# Patient Record
Sex: Male | Born: 1965
Health system: Southern US, Community
[De-identification: ages and names within clinical notes are randomized; demographics above are authoritative.]

## PROBLEM LIST (undated history)

## (undated) DIAGNOSIS — M1990 Unspecified osteoarthritis, unspecified site: Secondary | ICD-10-CM

## (undated) DIAGNOSIS — G8929 Other chronic pain: Secondary | ICD-10-CM

## (undated) DIAGNOSIS — N529 Male erectile dysfunction, unspecified: Secondary | ICD-10-CM

## (undated) DIAGNOSIS — M549 Dorsalgia, unspecified: Secondary | ICD-10-CM

## (undated) DIAGNOSIS — I10 Essential (primary) hypertension: Secondary | ICD-10-CM

## (undated) DIAGNOSIS — N400 Enlarged prostate without lower urinary tract symptoms: Secondary | ICD-10-CM

## (undated) DIAGNOSIS — E1140 Type 2 diabetes mellitus with diabetic neuropathy, unspecified: Secondary | ICD-10-CM

## (undated) DIAGNOSIS — D649 Anemia, unspecified: Secondary | ICD-10-CM

## (undated) DIAGNOSIS — N186 End stage renal disease: Secondary | ICD-10-CM

## (undated) DIAGNOSIS — R262 Difficulty in walking, not elsewhere classified: Secondary | ICD-10-CM

## (undated) DIAGNOSIS — I82409 Acute embolism and thrombosis of unspecified deep veins of unspecified lower extremity: Secondary | ICD-10-CM

## (undated) DIAGNOSIS — Z992 Dependence on renal dialysis: Secondary | ICD-10-CM

## (undated) DIAGNOSIS — Z9289 Personal history of other medical treatment: Secondary | ICD-10-CM

## (undated) DIAGNOSIS — I872 Venous insufficiency (chronic) (peripheral): Secondary | ICD-10-CM

## (undated) DIAGNOSIS — E119 Type 2 diabetes mellitus without complications: Secondary | ICD-10-CM

## (undated) HISTORY — DX: Dependence on renal dialysis: N18.6

## (undated) HISTORY — DX: End stage renal disease: Z99.2

## (undated) HISTORY — PX: INSERTION OF DIALYSIS CATHETER: SHX1324

## (undated) HISTORY — DX: Male erectile dysfunction, unspecified: N52.9

## (undated) HISTORY — DX: Essential (primary) hypertension: I10

## (undated) HISTORY — PX: CYST EXCISION: SHX5701

## (undated) HISTORY — DX: Type 2 diabetes mellitus without complications: E11.9

---

## 2000-06-30 ENCOUNTER — Emergency Department (HOSPITAL_COMMUNITY): Admission: EM | Admit: 2000-06-30 | Discharge: 2000-07-01 | Payer: Self-pay | Admitting: *Deleted

## 2000-08-30 ENCOUNTER — Emergency Department (HOSPITAL_COMMUNITY): Admission: EM | Admit: 2000-08-30 | Discharge: 2000-08-30 | Payer: Self-pay | Admitting: Emergency Medicine

## 2000-11-13 ENCOUNTER — Emergency Department (HOSPITAL_COMMUNITY): Admission: EM | Admit: 2000-11-13 | Discharge: 2000-11-14 | Payer: Self-pay | Admitting: Internal Medicine

## 2000-11-15 ENCOUNTER — Emergency Department (HOSPITAL_COMMUNITY): Admission: EM | Admit: 2000-11-15 | Discharge: 2000-11-15 | Payer: Self-pay | Admitting: *Deleted

## 2001-08-01 ENCOUNTER — Emergency Department (HOSPITAL_COMMUNITY): Admission: EM | Admit: 2001-08-01 | Discharge: 2001-08-01 | Payer: Self-pay | Admitting: Emergency Medicine

## 2001-12-11 ENCOUNTER — Emergency Department (HOSPITAL_COMMUNITY): Admission: EM | Admit: 2001-12-11 | Discharge: 2001-12-11 | Payer: Self-pay | Admitting: Internal Medicine

## 2001-12-11 ENCOUNTER — Encounter: Payer: Self-pay | Admitting: Internal Medicine

## 2002-02-16 ENCOUNTER — Emergency Department (HOSPITAL_COMMUNITY): Admission: EM | Admit: 2002-02-16 | Discharge: 2002-02-16 | Payer: Self-pay | Admitting: *Deleted

## 2002-02-16 ENCOUNTER — Encounter: Payer: Self-pay | Admitting: *Deleted

## 2002-05-20 ENCOUNTER — Emergency Department (HOSPITAL_COMMUNITY): Admission: EM | Admit: 2002-05-20 | Discharge: 2002-05-20 | Payer: Self-pay | Admitting: Emergency Medicine

## 2002-05-23 ENCOUNTER — Emergency Department (HOSPITAL_COMMUNITY): Admission: EM | Admit: 2002-05-23 | Discharge: 2002-05-23 | Payer: Self-pay | Admitting: *Deleted

## 2002-05-23 ENCOUNTER — Encounter: Payer: Self-pay | Admitting: *Deleted

## 2002-07-01 ENCOUNTER — Emergency Department (HOSPITAL_COMMUNITY): Admission: EM | Admit: 2002-07-01 | Discharge: 2002-07-01 | Payer: Self-pay | Admitting: Emergency Medicine

## 2002-07-21 ENCOUNTER — Emergency Department (HOSPITAL_COMMUNITY): Admission: EM | Admit: 2002-07-21 | Discharge: 2002-07-21 | Payer: Self-pay | Admitting: Emergency Medicine

## 2002-07-23 ENCOUNTER — Emergency Department (HOSPITAL_COMMUNITY): Admission: EM | Admit: 2002-07-23 | Discharge: 2002-07-23 | Payer: Self-pay | Admitting: *Deleted

## 2002-09-13 ENCOUNTER — Emergency Department (HOSPITAL_COMMUNITY): Admission: EM | Admit: 2002-09-13 | Discharge: 2002-09-13 | Payer: Self-pay | Admitting: Emergency Medicine

## 2002-09-20 ENCOUNTER — Emergency Department (HOSPITAL_COMMUNITY): Admission: EM | Admit: 2002-09-20 | Discharge: 2002-09-20 | Payer: Self-pay | Admitting: Emergency Medicine

## 2002-12-09 ENCOUNTER — Emergency Department (HOSPITAL_COMMUNITY): Admission: EM | Admit: 2002-12-09 | Discharge: 2002-12-09 | Payer: Self-pay | Admitting: *Deleted

## 2003-06-26 ENCOUNTER — Emergency Department (HOSPITAL_COMMUNITY): Admission: EM | Admit: 2003-06-26 | Discharge: 2003-06-26 | Payer: Self-pay | Admitting: Emergency Medicine

## 2003-06-27 ENCOUNTER — Emergency Department (HOSPITAL_COMMUNITY): Admission: EM | Admit: 2003-06-27 | Discharge: 2003-06-27 | Payer: Self-pay | Admitting: Emergency Medicine

## 2003-06-28 ENCOUNTER — Emergency Department (HOSPITAL_COMMUNITY): Admission: EM | Admit: 2003-06-28 | Discharge: 2003-06-28 | Payer: Self-pay | Admitting: Emergency Medicine

## 2003-07-05 ENCOUNTER — Emergency Department (HOSPITAL_COMMUNITY): Admission: EM | Admit: 2003-07-05 | Discharge: 2003-07-05 | Payer: Self-pay | Admitting: Emergency Medicine

## 2003-10-05 ENCOUNTER — Emergency Department (HOSPITAL_COMMUNITY): Admission: EM | Admit: 2003-10-05 | Discharge: 2003-10-06 | Payer: Self-pay | Admitting: *Deleted

## 2003-12-24 ENCOUNTER — Emergency Department (HOSPITAL_COMMUNITY): Admission: EM | Admit: 2003-12-24 | Discharge: 2003-12-24 | Payer: Self-pay | Admitting: Emergency Medicine

## 2004-01-15 ENCOUNTER — Emergency Department (HOSPITAL_COMMUNITY): Admission: EM | Admit: 2004-01-15 | Discharge: 2004-01-15 | Payer: Self-pay | Admitting: Emergency Medicine

## 2004-01-16 ENCOUNTER — Ambulatory Visit (HOSPITAL_COMMUNITY): Admission: RE | Admit: 2004-01-16 | Discharge: 2004-01-16 | Payer: Self-pay | Admitting: Emergency Medicine

## 2004-01-22 ENCOUNTER — Emergency Department (HOSPITAL_COMMUNITY): Admission: EM | Admit: 2004-01-22 | Discharge: 2004-01-22 | Payer: Self-pay | Admitting: Emergency Medicine

## 2004-01-31 ENCOUNTER — Emergency Department (HOSPITAL_COMMUNITY): Admission: EM | Admit: 2004-01-31 | Discharge: 2004-02-01 | Payer: Self-pay | Admitting: Emergency Medicine

## 2004-02-02 ENCOUNTER — Emergency Department (HOSPITAL_COMMUNITY): Admission: EM | Admit: 2004-02-02 | Discharge: 2004-02-02 | Payer: Self-pay | Admitting: Emergency Medicine

## 2004-03-05 ENCOUNTER — Emergency Department (HOSPITAL_COMMUNITY): Admission: EM | Admit: 2004-03-05 | Discharge: 2004-03-05 | Payer: Self-pay | Admitting: Emergency Medicine

## 2004-04-17 ENCOUNTER — Emergency Department (HOSPITAL_COMMUNITY): Admission: EM | Admit: 2004-04-17 | Discharge: 2004-04-17 | Payer: Self-pay | Admitting: Emergency Medicine

## 2004-06-04 ENCOUNTER — Emergency Department (HOSPITAL_COMMUNITY): Admission: EM | Admit: 2004-06-04 | Discharge: 2004-06-04 | Payer: Self-pay | Admitting: Emergency Medicine

## 2004-06-11 ENCOUNTER — Emergency Department (HOSPITAL_COMMUNITY): Admission: EM | Admit: 2004-06-11 | Discharge: 2004-06-11 | Payer: Self-pay | Admitting: Emergency Medicine

## 2004-07-11 ENCOUNTER — Emergency Department (HOSPITAL_COMMUNITY): Admission: EM | Admit: 2004-07-11 | Discharge: 2004-07-11 | Payer: Self-pay | Admitting: Emergency Medicine

## 2004-07-13 ENCOUNTER — Emergency Department (HOSPITAL_COMMUNITY): Admission: EM | Admit: 2004-07-13 | Discharge: 2004-07-13 | Payer: Self-pay | Admitting: *Deleted

## 2004-07-15 ENCOUNTER — Emergency Department (HOSPITAL_COMMUNITY): Admission: EM | Admit: 2004-07-15 | Discharge: 2004-07-15 | Payer: Self-pay | Admitting: Emergency Medicine

## 2004-08-12 ENCOUNTER — Emergency Department (HOSPITAL_COMMUNITY): Admission: EM | Admit: 2004-08-12 | Discharge: 2004-08-12 | Payer: Self-pay | Admitting: Emergency Medicine

## 2004-09-23 ENCOUNTER — Emergency Department (HOSPITAL_COMMUNITY): Admission: EM | Admit: 2004-09-23 | Discharge: 2004-09-23 | Payer: Self-pay | Admitting: Emergency Medicine

## 2004-11-29 ENCOUNTER — Emergency Department (HOSPITAL_COMMUNITY): Admission: EM | Admit: 2004-11-29 | Discharge: 2004-11-29 | Payer: Self-pay | Admitting: Emergency Medicine

## 2005-01-14 ENCOUNTER — Observation Stay (HOSPITAL_COMMUNITY): Admission: EM | Admit: 2005-01-14 | Discharge: 2005-01-15 | Payer: Self-pay | Admitting: *Deleted

## 2005-01-14 ENCOUNTER — Ambulatory Visit: Payer: Self-pay | Admitting: Cardiology

## 2005-02-17 ENCOUNTER — Emergency Department (HOSPITAL_COMMUNITY): Admission: EM | Admit: 2005-02-17 | Discharge: 2005-02-17 | Payer: Self-pay | Admitting: Emergency Medicine

## 2005-02-19 ENCOUNTER — Emergency Department (HOSPITAL_COMMUNITY): Admission: EM | Admit: 2005-02-19 | Discharge: 2005-02-19 | Payer: Self-pay | Admitting: Emergency Medicine

## 2005-03-05 ENCOUNTER — Emergency Department (HOSPITAL_COMMUNITY): Admission: EM | Admit: 2005-03-05 | Discharge: 2005-03-05 | Payer: Self-pay | Admitting: Emergency Medicine

## 2005-03-17 ENCOUNTER — Emergency Department (HOSPITAL_COMMUNITY): Admission: EM | Admit: 2005-03-17 | Discharge: 2005-03-17 | Payer: Self-pay | Admitting: Emergency Medicine

## 2005-04-02 ENCOUNTER — Emergency Department (HOSPITAL_COMMUNITY): Admission: EM | Admit: 2005-04-02 | Discharge: 2005-04-02 | Payer: Self-pay | Admitting: Emergency Medicine

## 2005-06-26 ENCOUNTER — Emergency Department (HOSPITAL_COMMUNITY): Admission: EM | Admit: 2005-06-26 | Discharge: 2005-06-26 | Payer: Self-pay | Admitting: Emergency Medicine

## 2005-07-19 IMAGING — US US EXTREM LOW VENOUS*L*
1 series · 14 of 24 positions shown · non-contrast
Comparison: none

CLINICAL DATA: Pain lt leg.
 ULTRASOUND VENOUS IMAGING UNILATERAL LEFT LOWER EXTREMITY:
 Multiple scans of the left leg show no evidence of deep venous thrombi.  The deep veins show normal compressibility and waveforms.  The saphenous system also appears normal.

[Series 1: unknown · 14 of 29 slices shown]
[im 1/29]
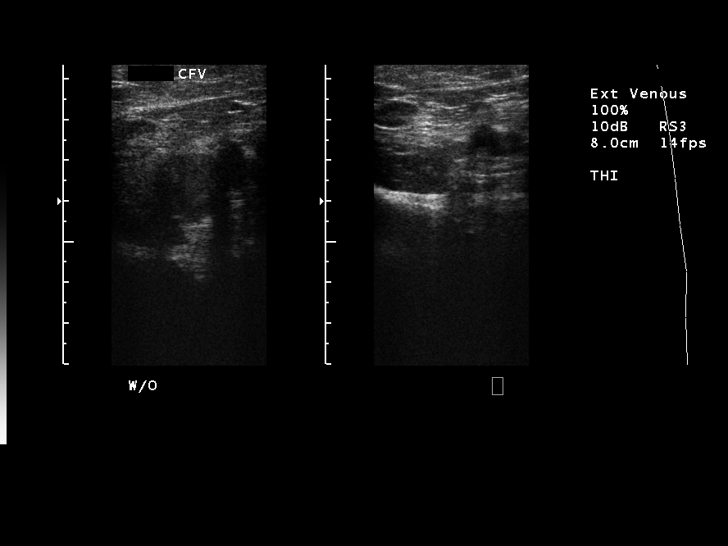
[im 3/29]
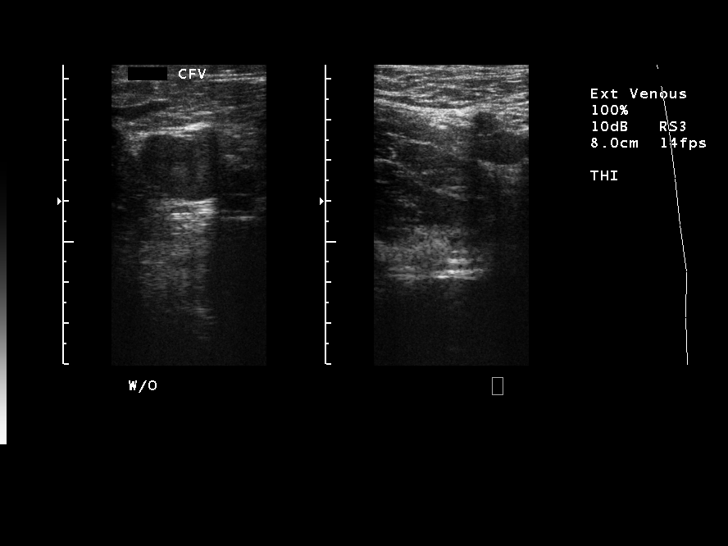
[im 5/29]
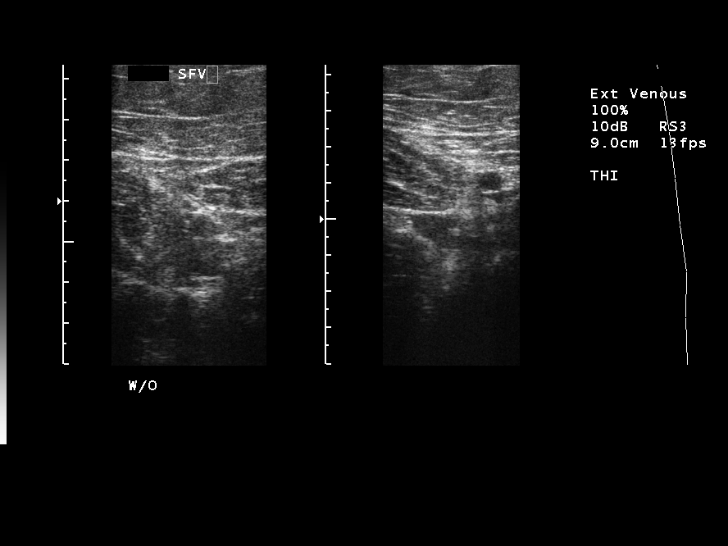
[im 8/29]
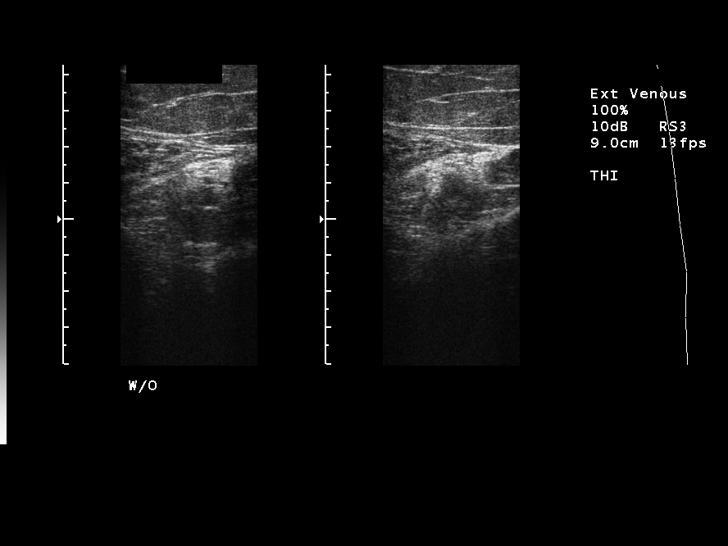
[im 9/29]
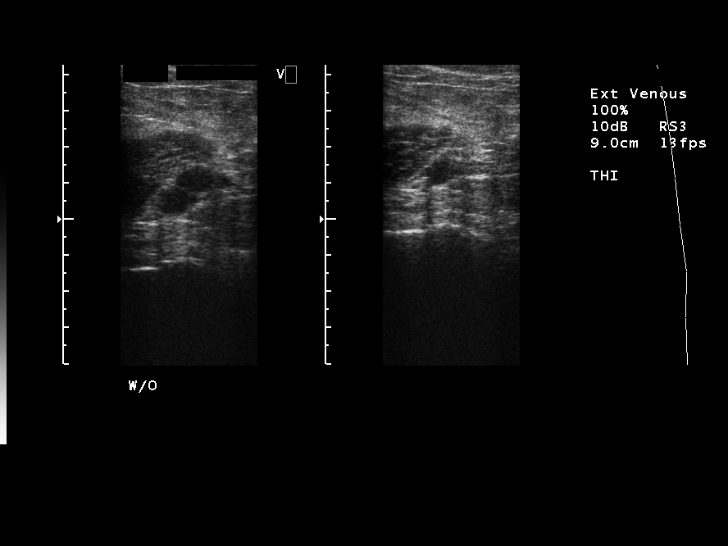
[im 11/29]
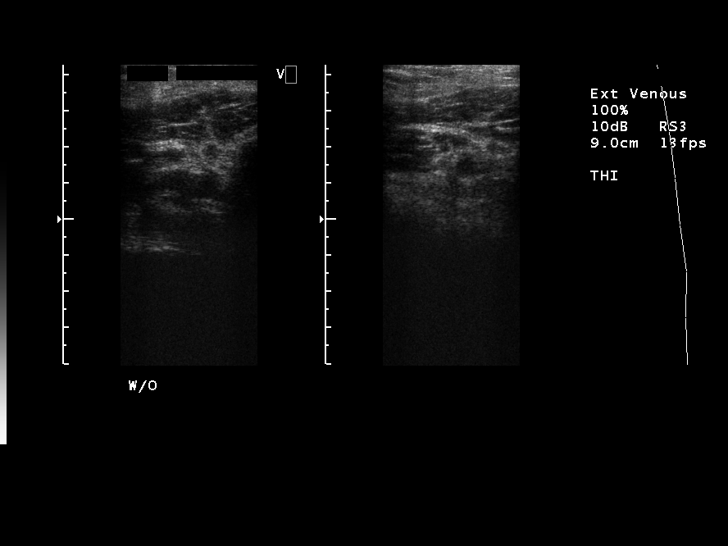
[im 14/29]
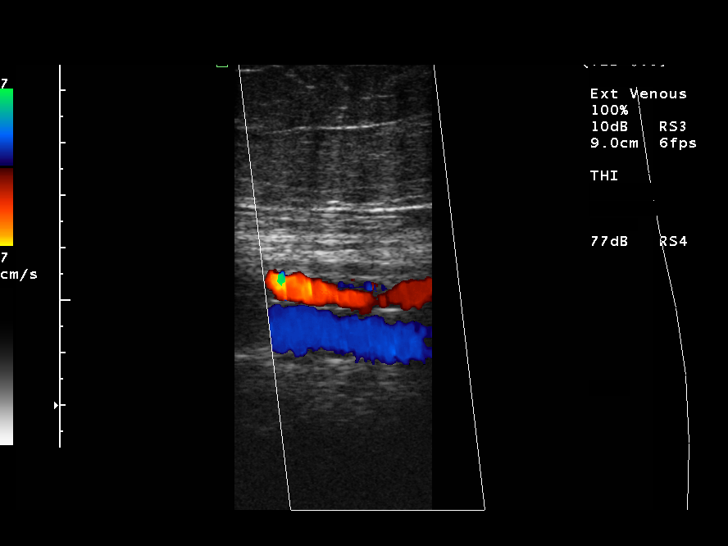
[im 15/29]
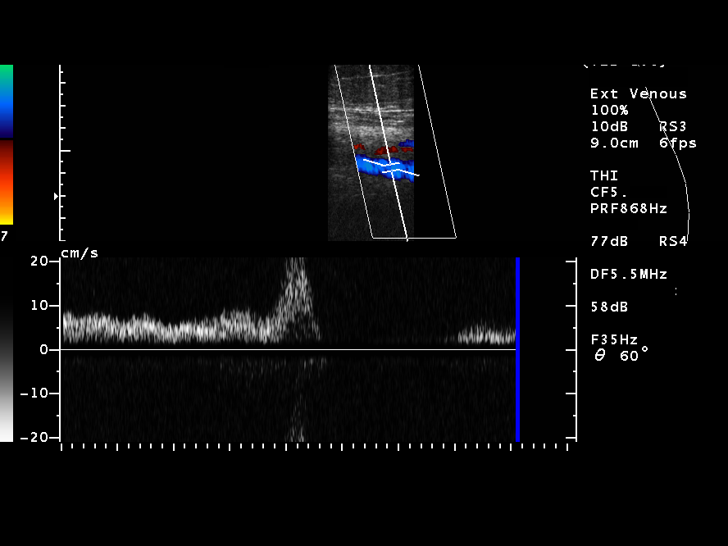
[im 18/29]
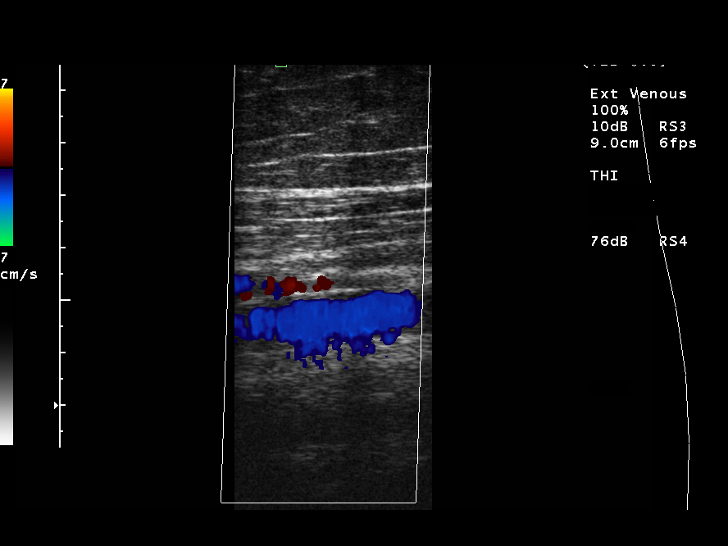
[im 20/29]
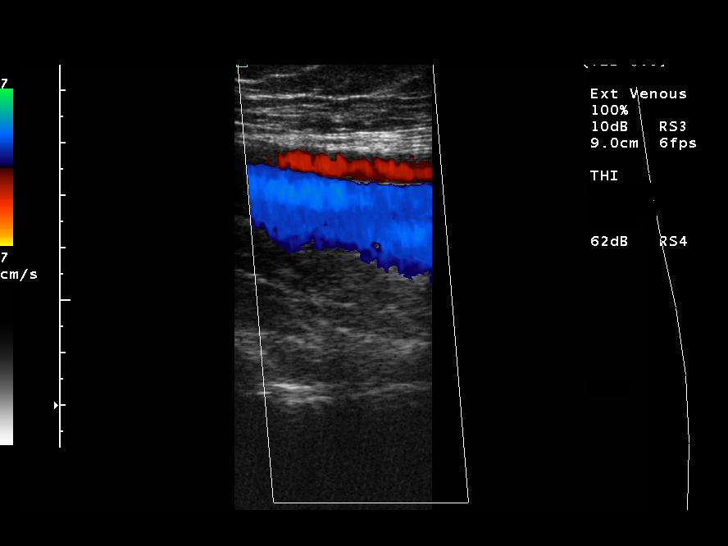
[im 22/29]
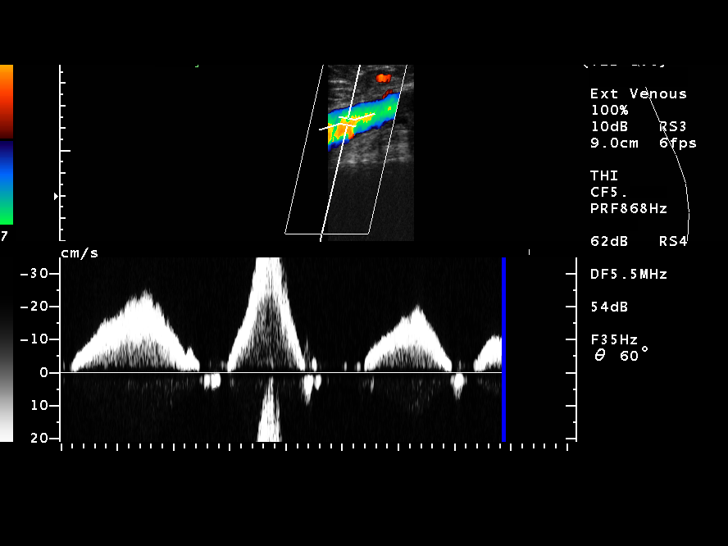
[im 24/29]
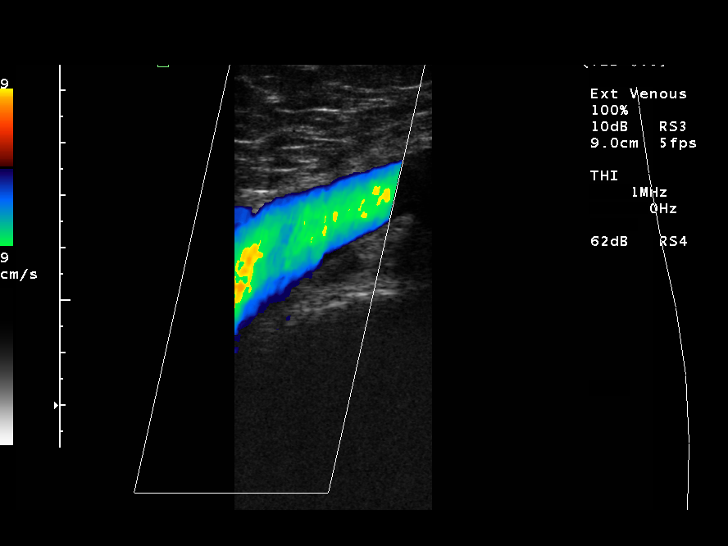
[im 26/29]
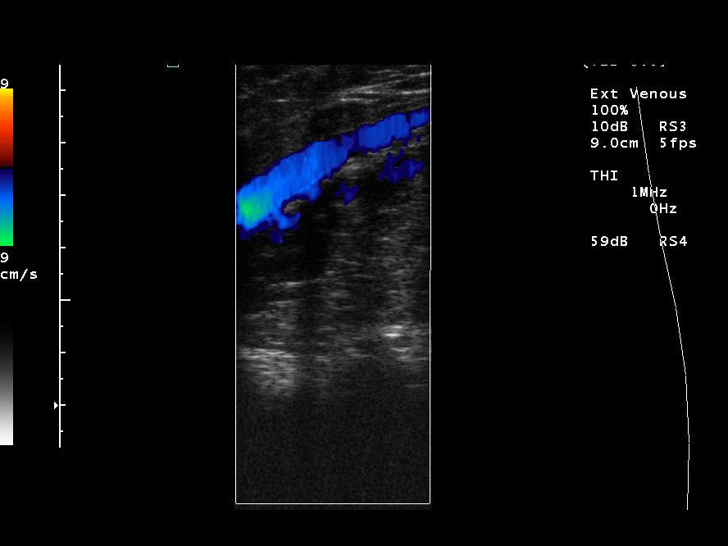
[im 29/29]
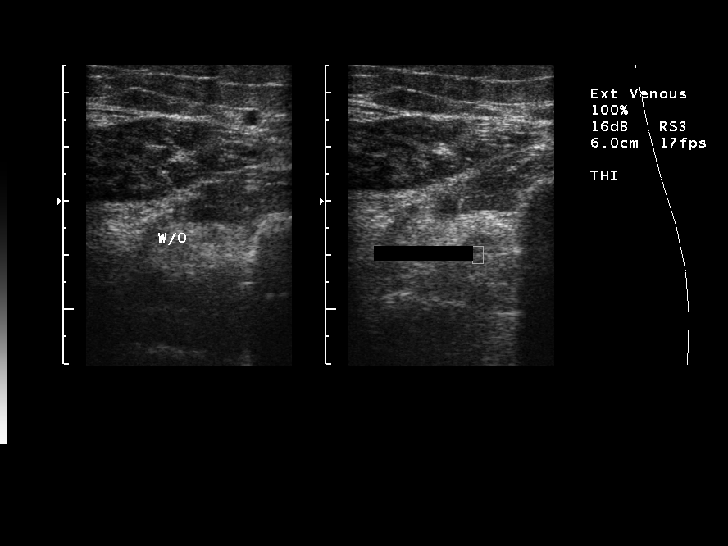

[14 of 24 positions shown; findings below may reference images not displayed]

IMPRESSION: No evidence of deep venous thrombi or other abnormality venous structures of the left leg.

## 2005-07-25 IMAGING — CR DG FOOT COMPLETE 3+V*R*
3 series · 3 of 3 positions shown · non-contrast
Comparison: none

CLINICAL DATA: Foot pain and swelling for 3 days.  No injury. 
 RIGHT FOOT 3 VIEWS, 01/22/04:
 Soft tissue swelling is present on the dorsal aspect of the foot.  No fracture or dislocation is seen.  There are advanced degenerative changes of the 1st metatarsophalangeal joint.  There is spur formation on the dorsal aspect of the mid foot that could be from old trauma.  A plantar heel spur is present.

[view not recorded (1 of 3)]
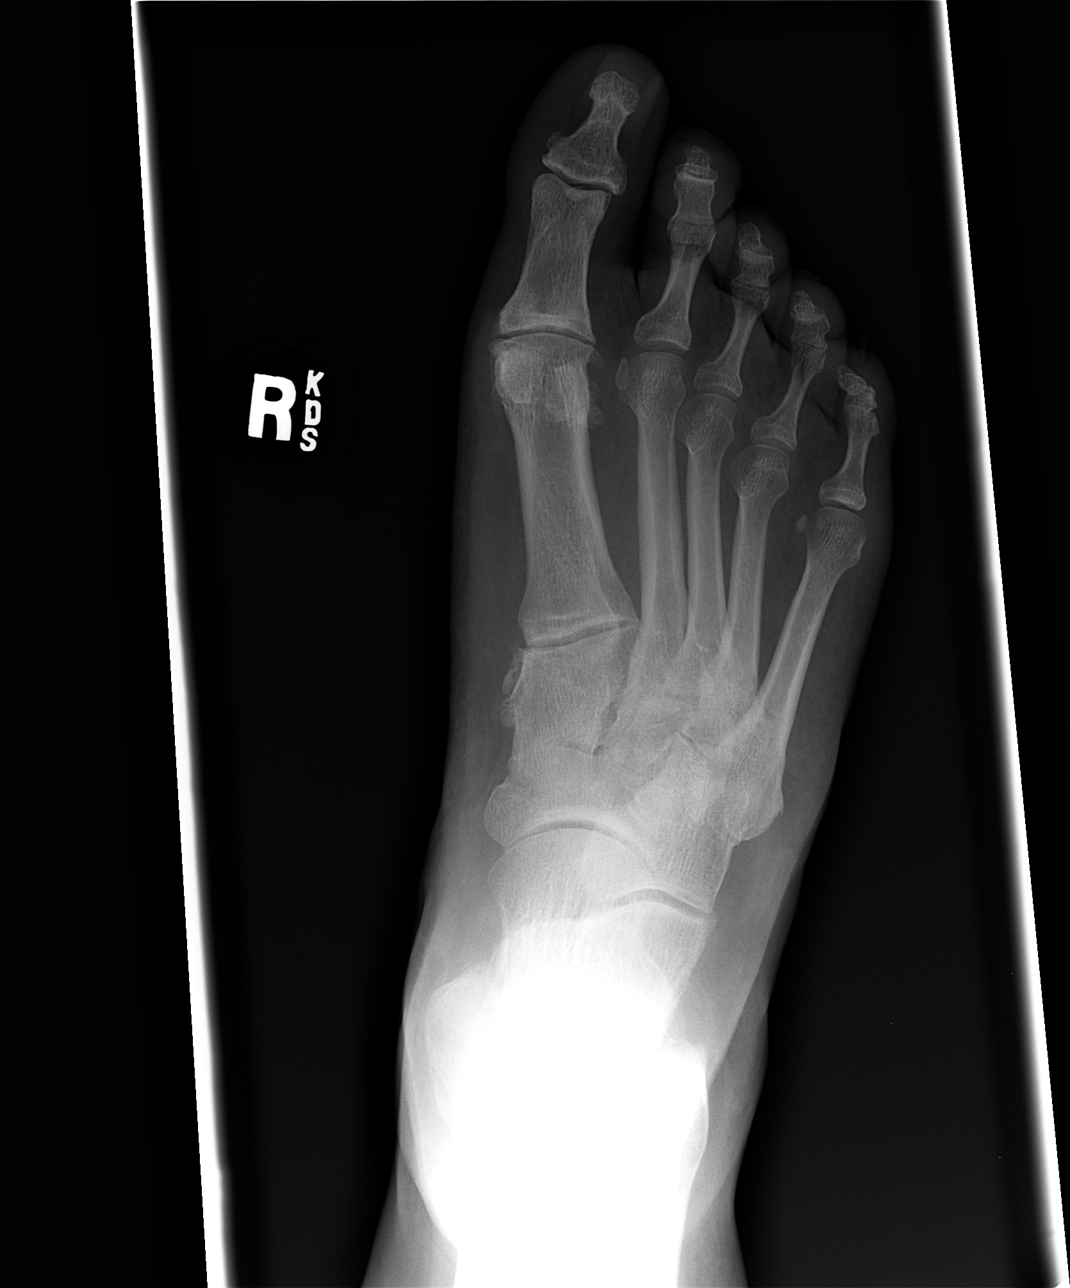

[view not recorded (2 of 3)]
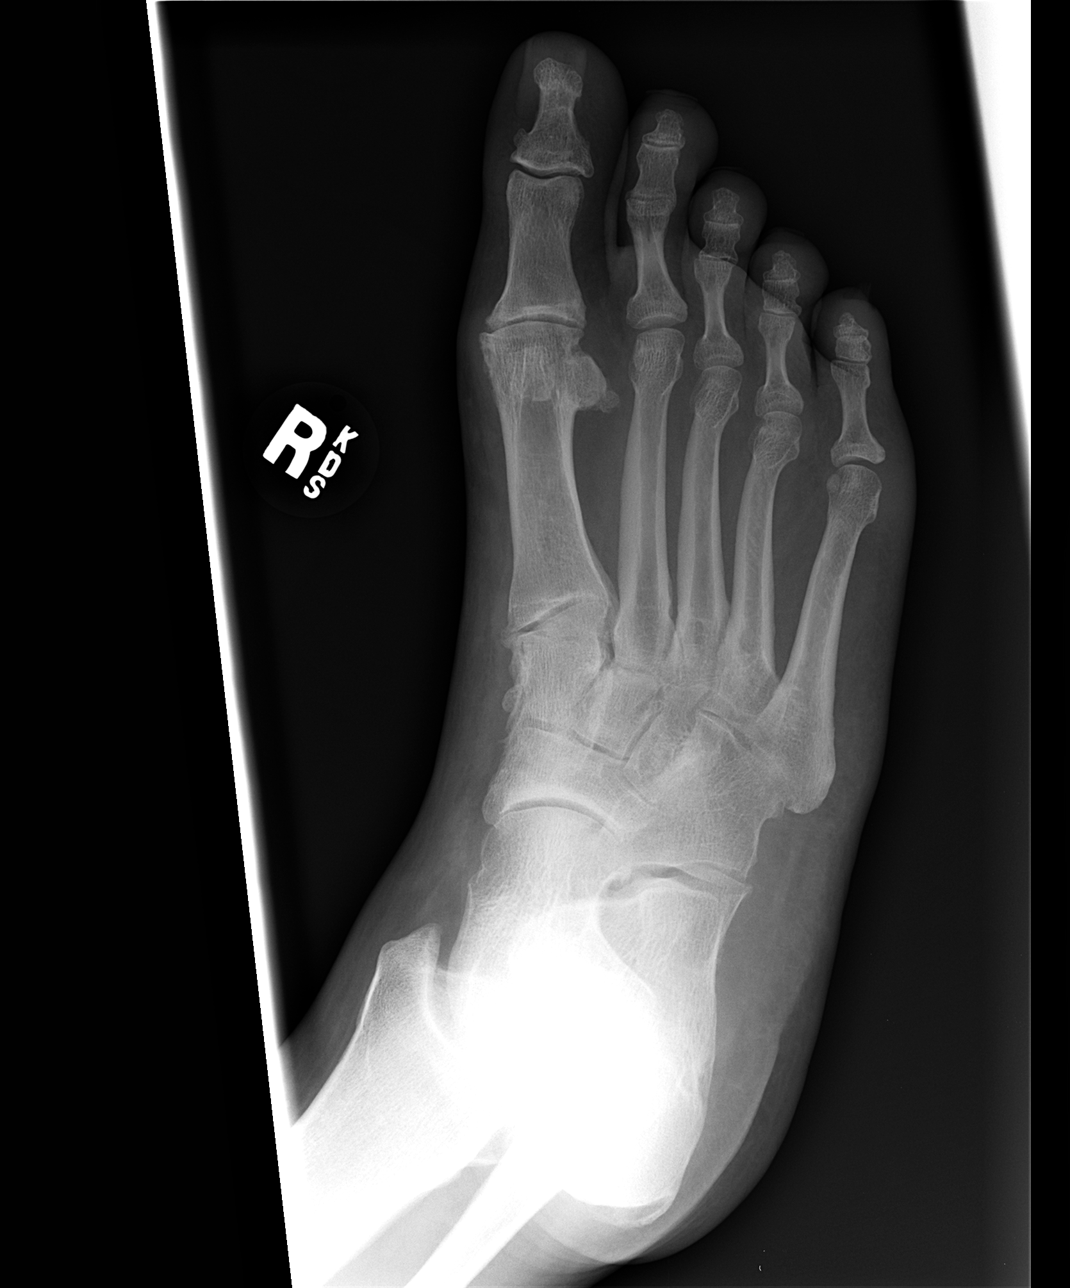

[view not recorded (3 of 3)]
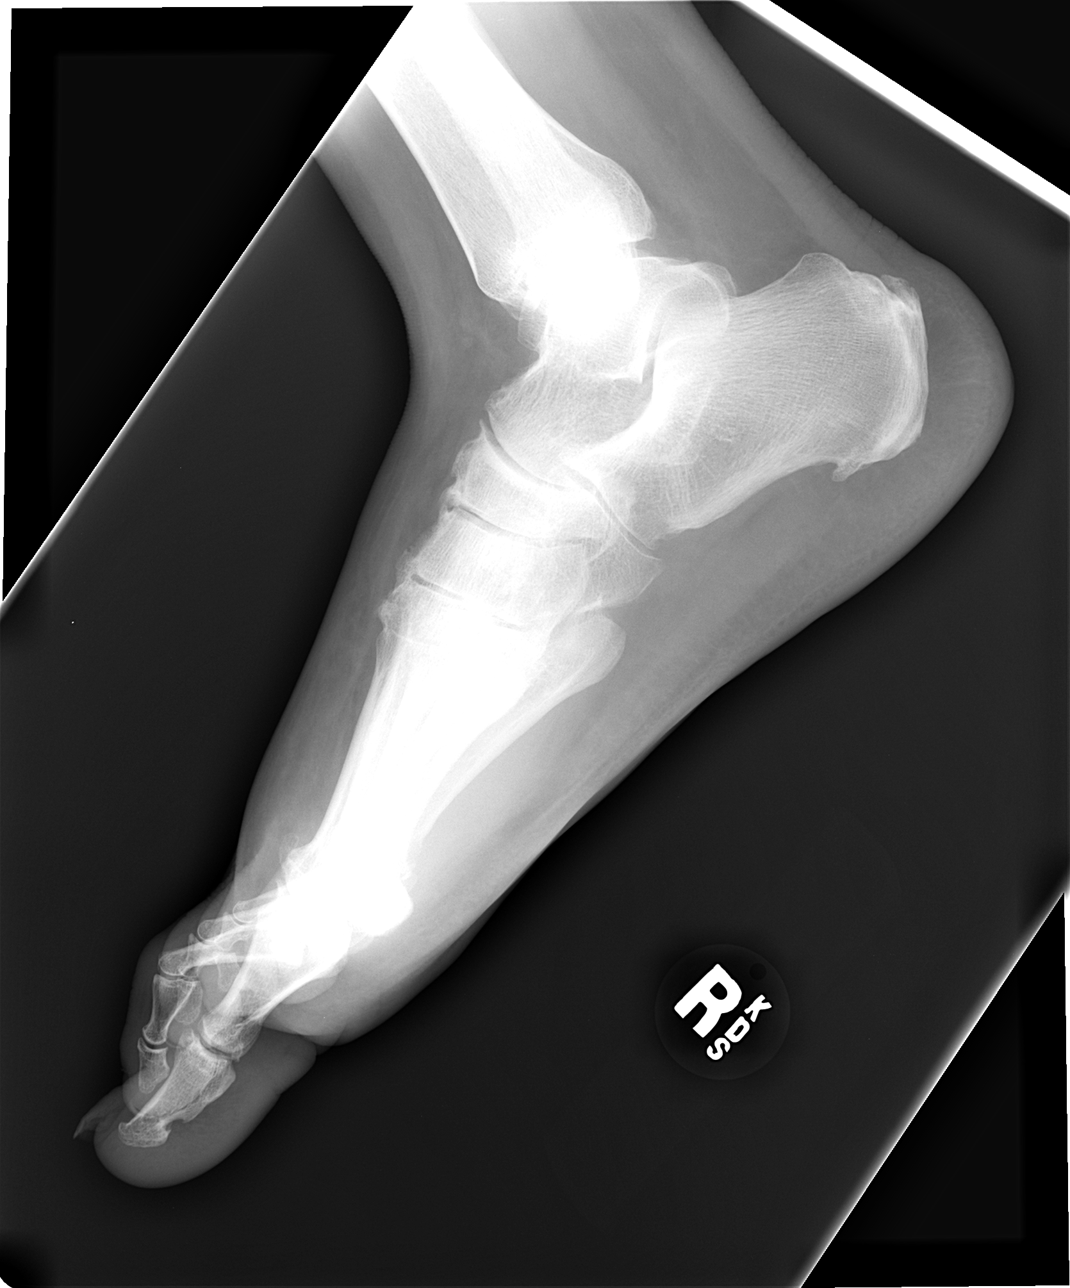

[3 of 3 positions shown; findings below may reference images not displayed]

IMPRESSION: 1. Dorsal soft tissue swelling. 
 2. Degenerative change of the 1st metatarsophalangeal joint.  
 3. Degenerative changes on the dorsal aspect of the mid foot that could be from old trauma. 
 4. Plantar heel spur.

## 2005-07-25 IMAGING — CR DG HIP COMPLETE 2+V*R*
3 series · 3 of 3 positions shown · non-contrast
Comparison: none

CLINICAL DATA: Pain and swelling for three days; no injury
 COMPLETE RIGHT HIP:
 There are moderate degenerative changes of both hips.  No fracture or dislocation is seen.  Sacroiliac joints within normal limits.

[view not recorded (1 of 3)]
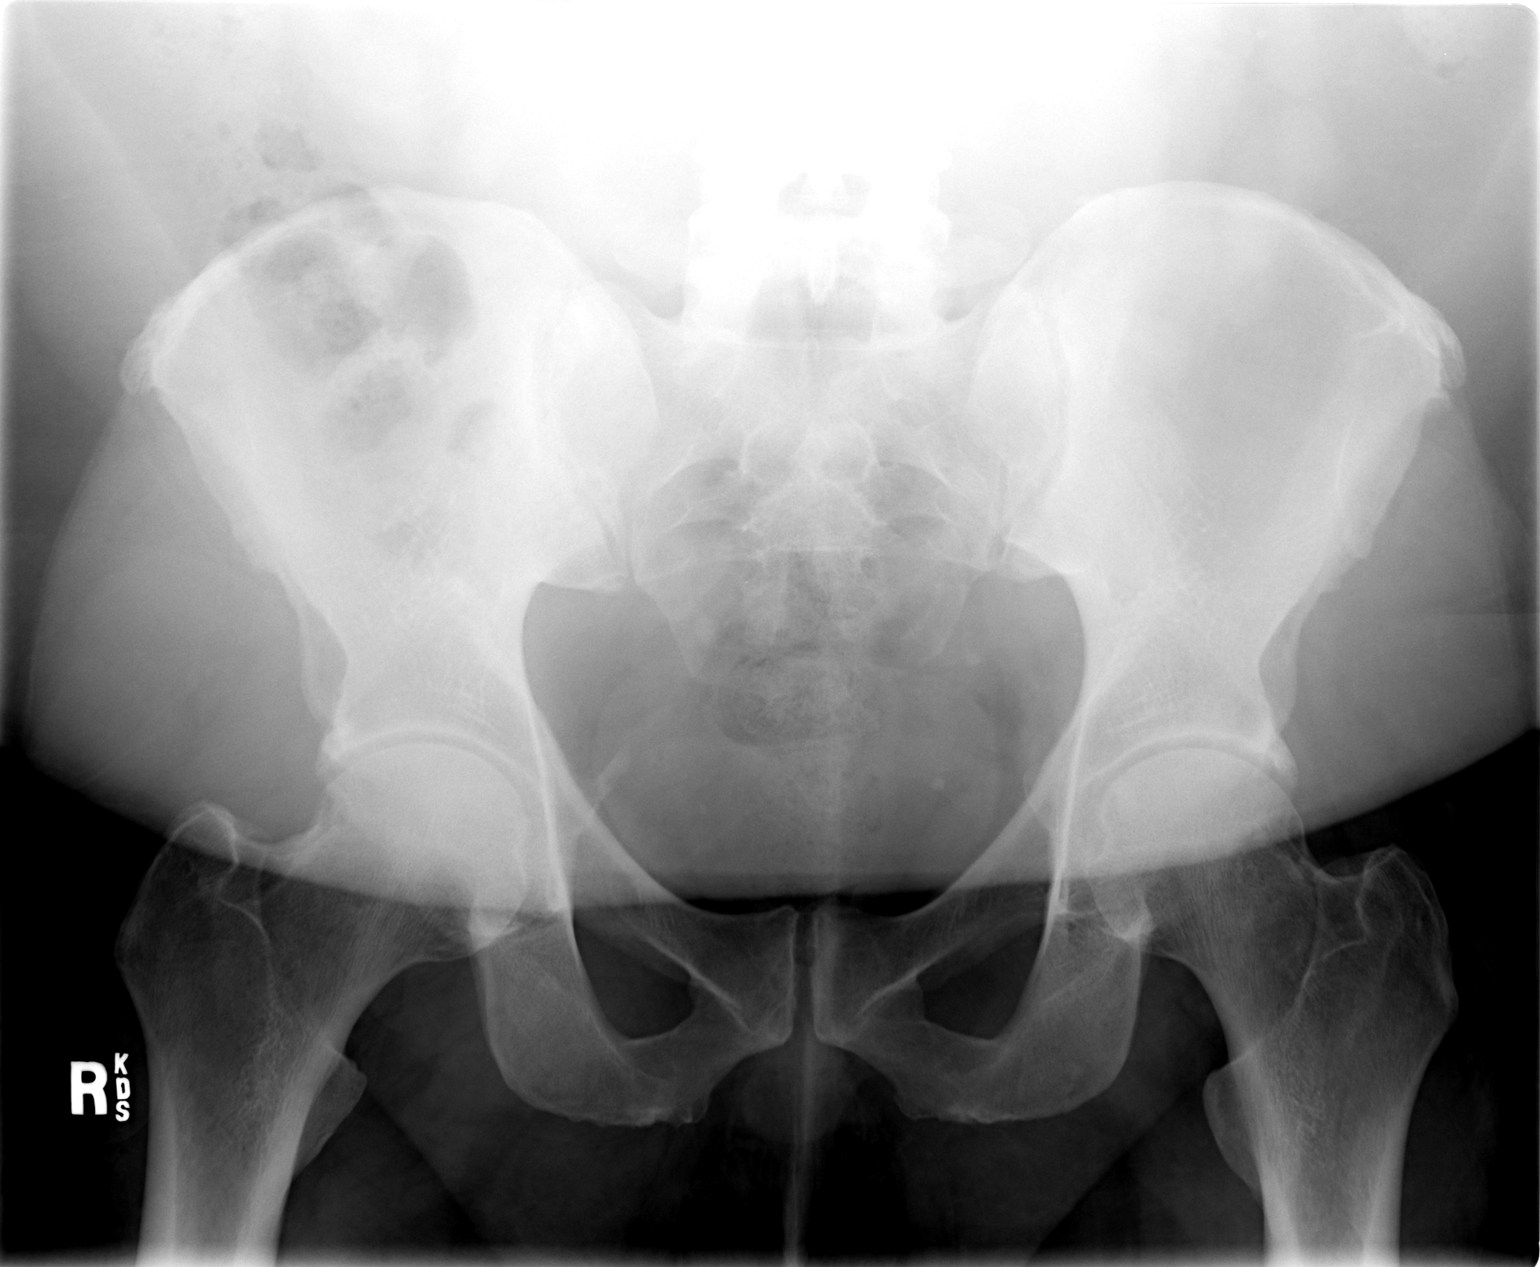

[view not recorded (2 of 3)]
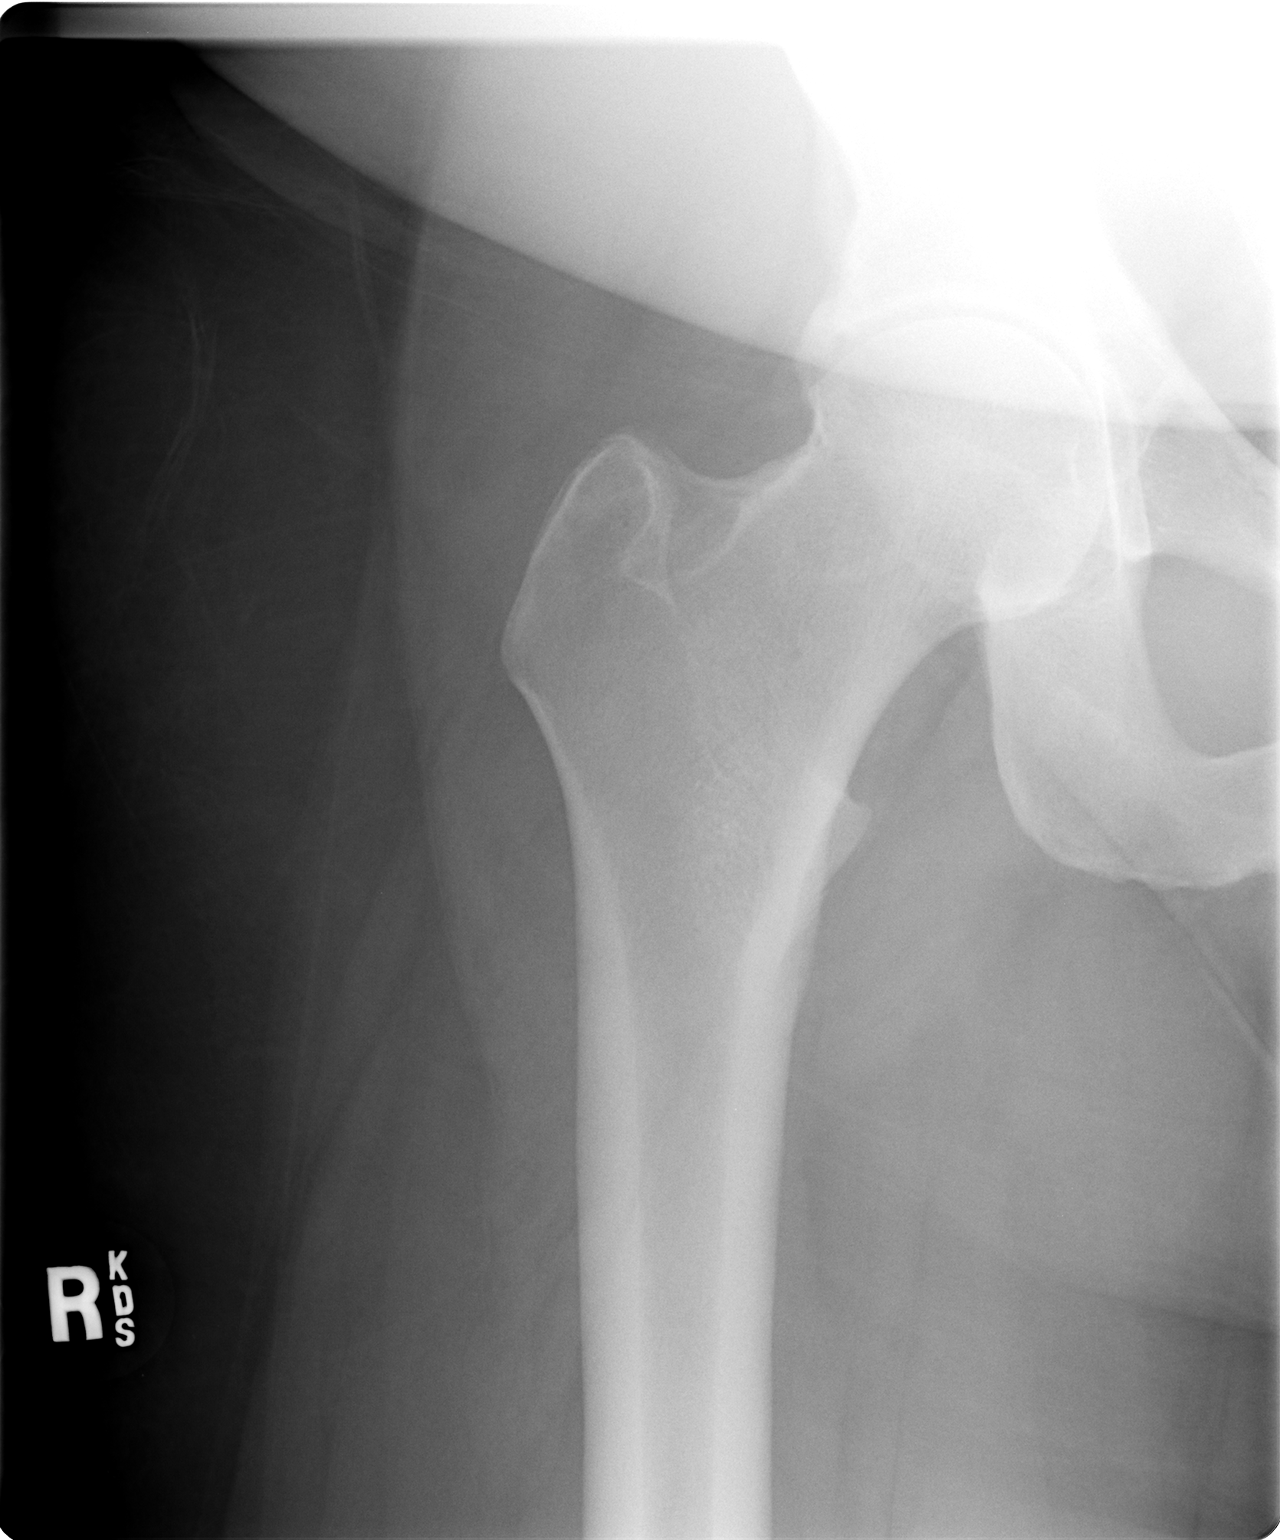

[view not recorded (3 of 3)]
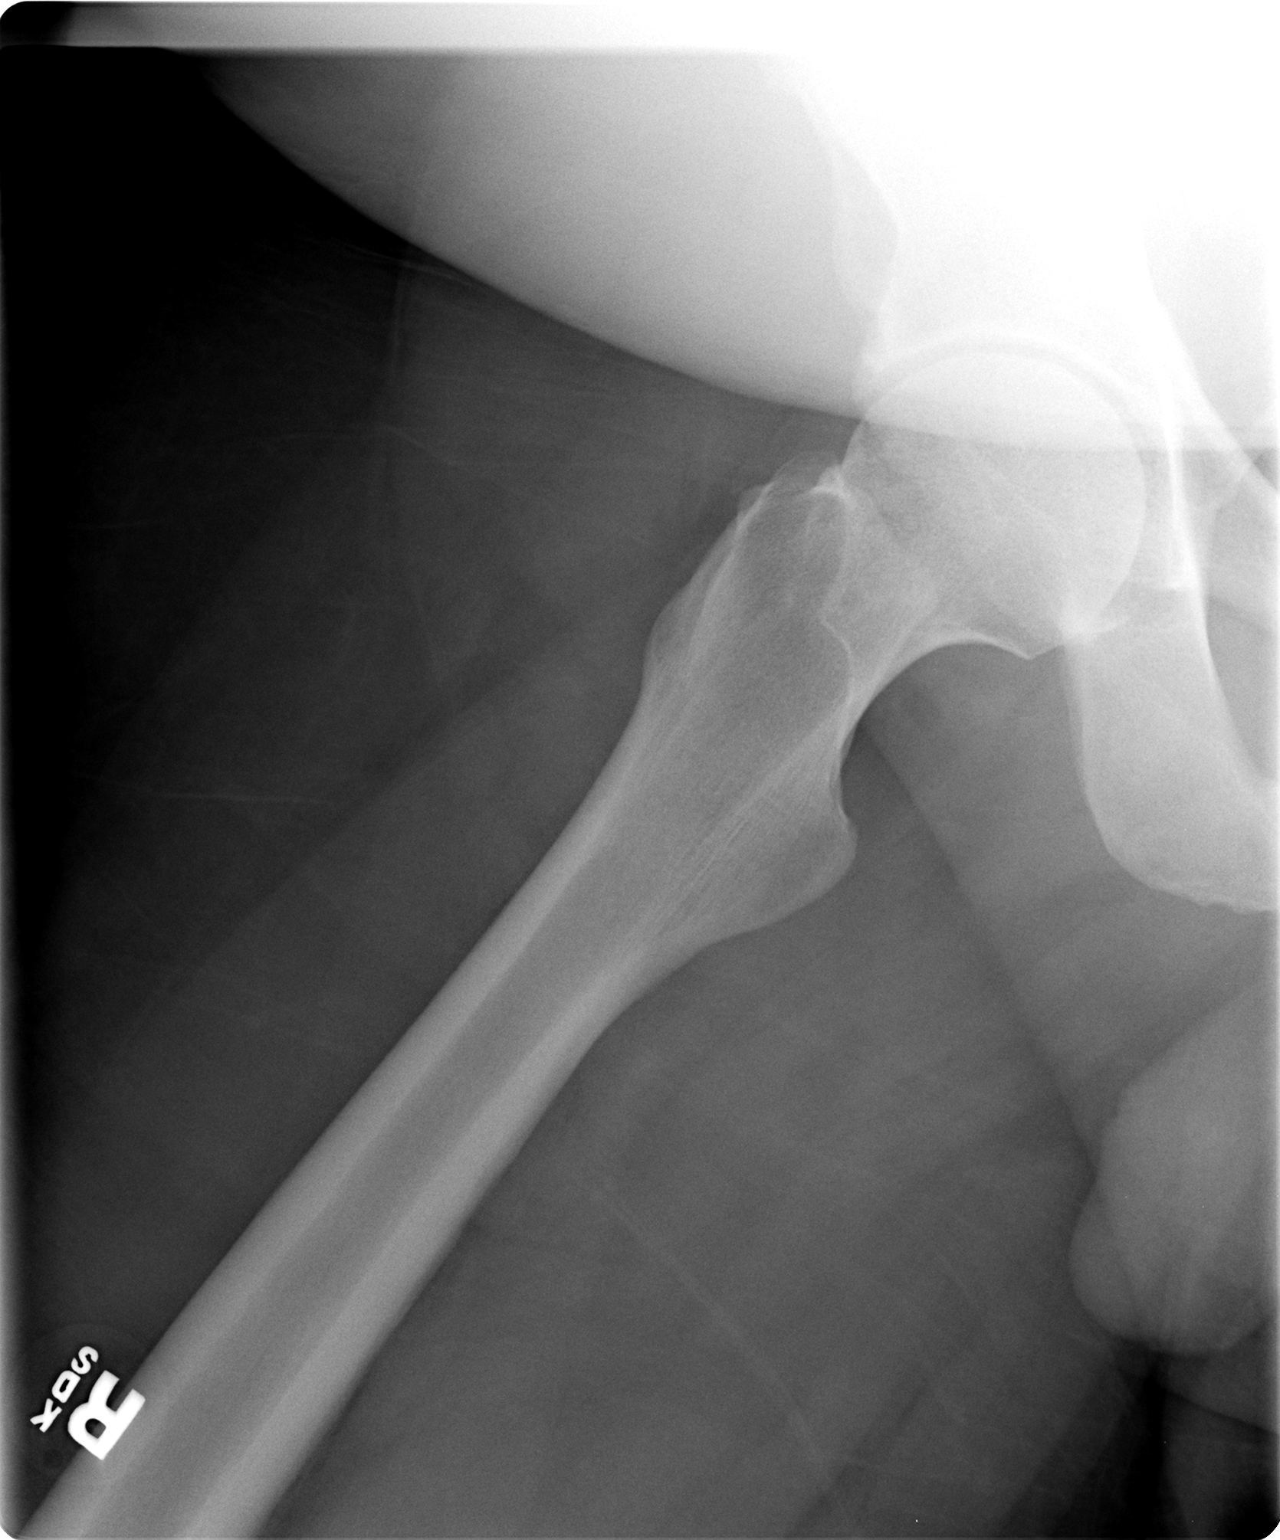

[3 of 3 positions shown; findings below may reference images not displayed]

IMPRESSION: Moderate degenerative changes of both hips.

## 2005-08-04 ENCOUNTER — Emergency Department (HOSPITAL_COMMUNITY): Admission: EM | Admit: 2005-08-04 | Discharge: 2005-08-04 | Payer: Self-pay | Admitting: Emergency Medicine

## 2005-08-04 IMAGING — CR DG WRIST COMPLETE 3+V*R*
4 series · 4 of 4 positions shown · non-contrast
Comparison: none

CLINICAL DATA: Right wrist pain and swelling.  History of   gout.  No known trauma.
 RIGHT WRIST FOUR VIEWS:
 There is no bony abnormality of the wrist.  There is a slight deformity of the head of the second metacarpal, which may be due to remote trauma.

[view not recorded (1 of 4)]
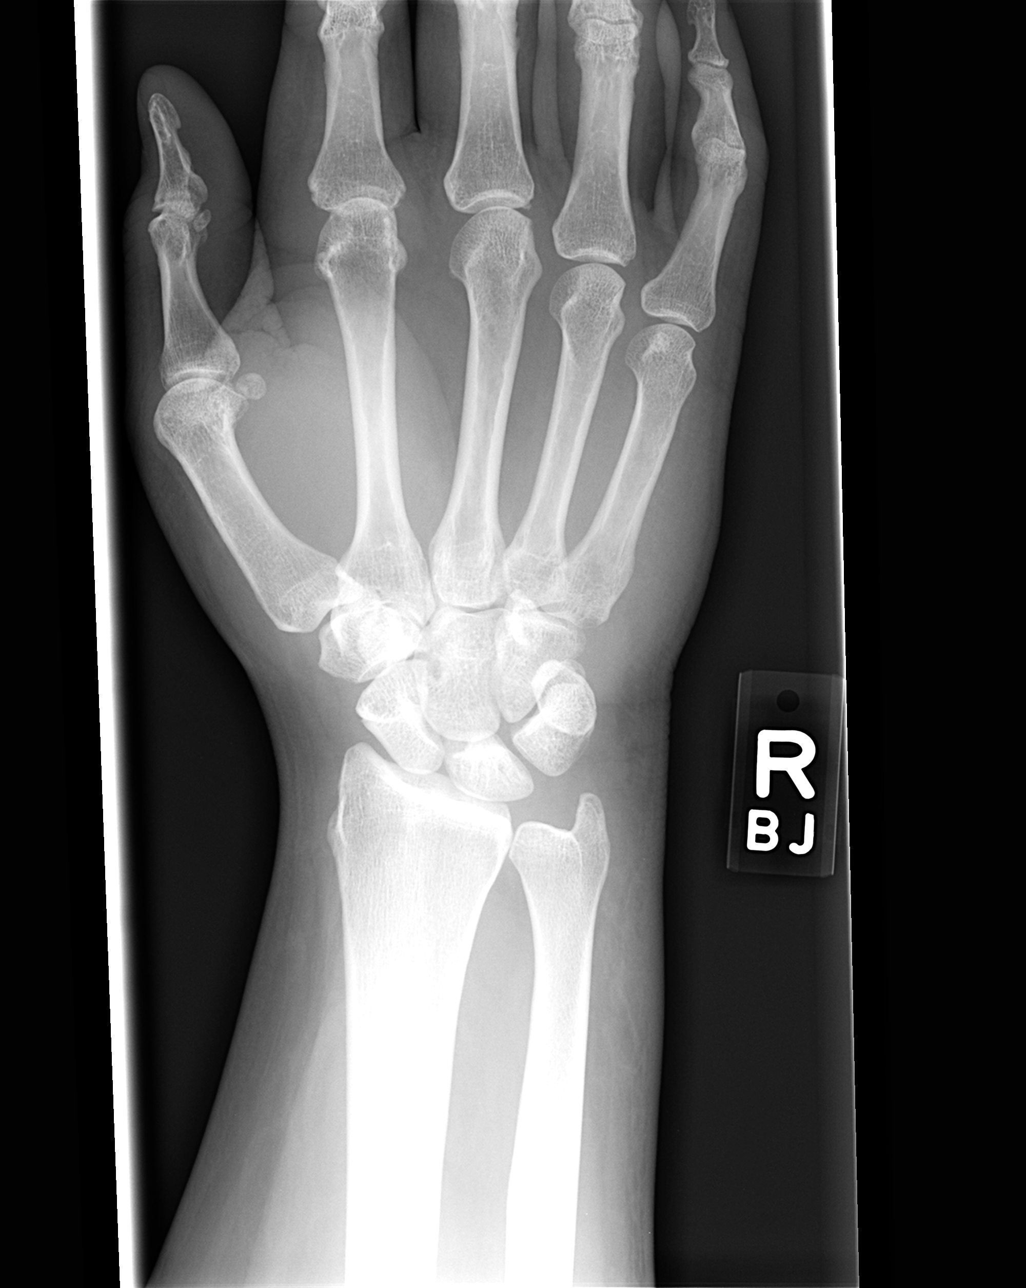

[view not recorded (2 of 4)]
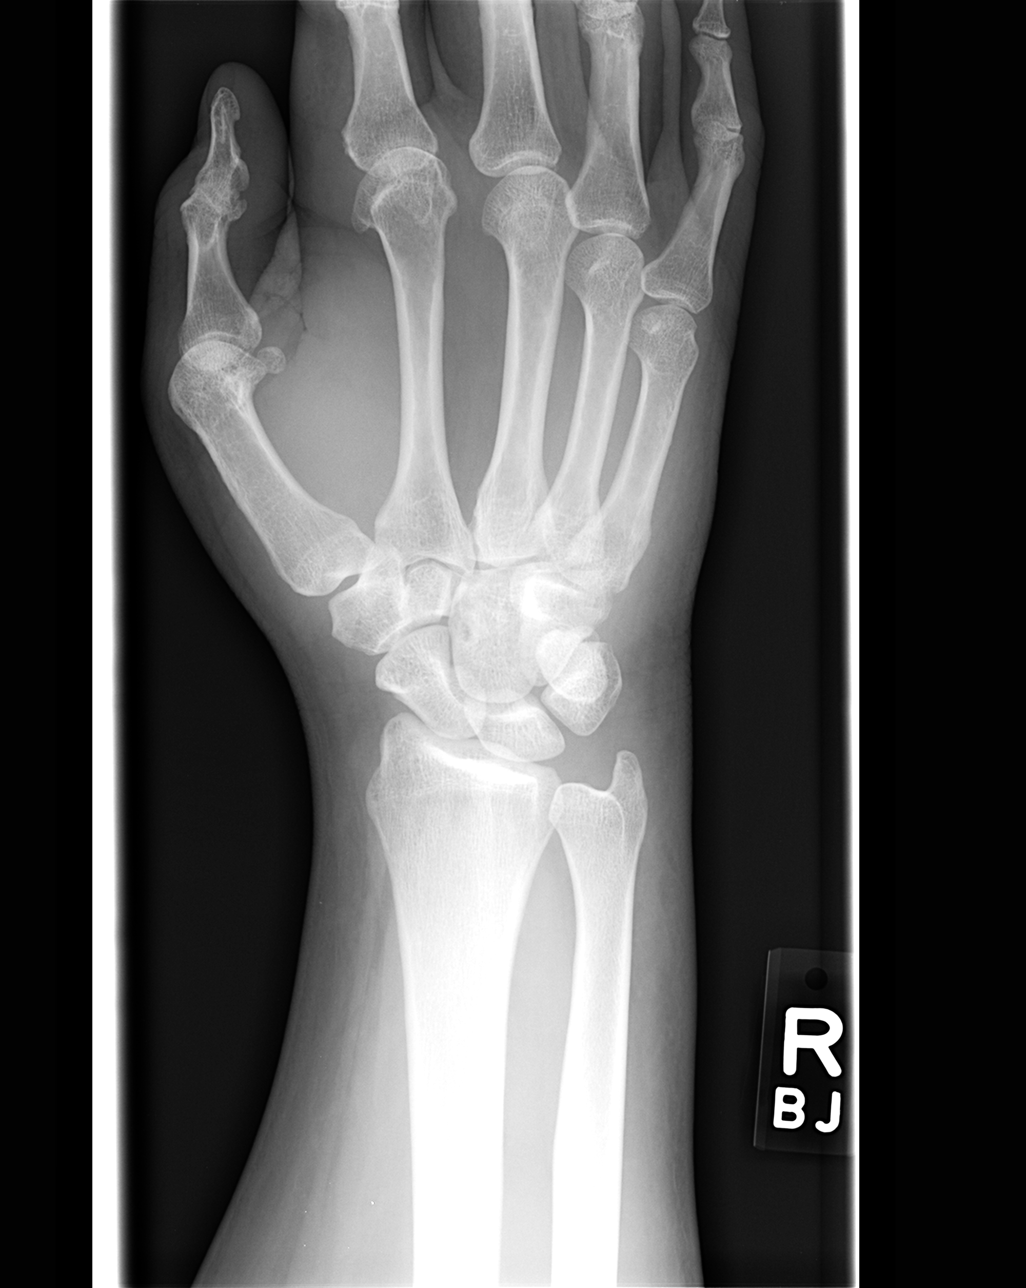

[view not recorded (3 of 4)]
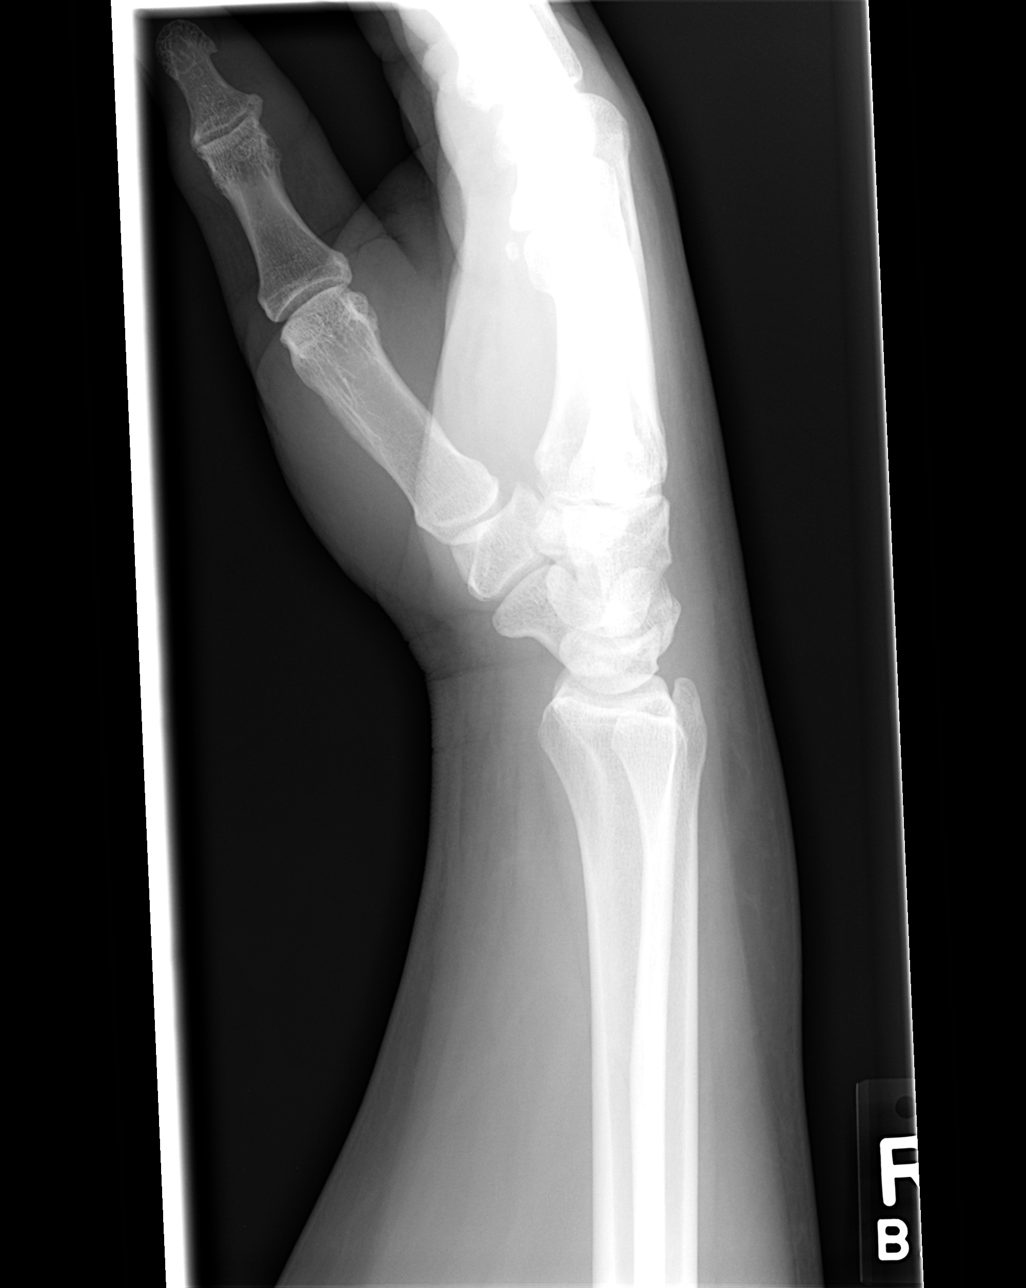

[view not recorded (4 of 4)]
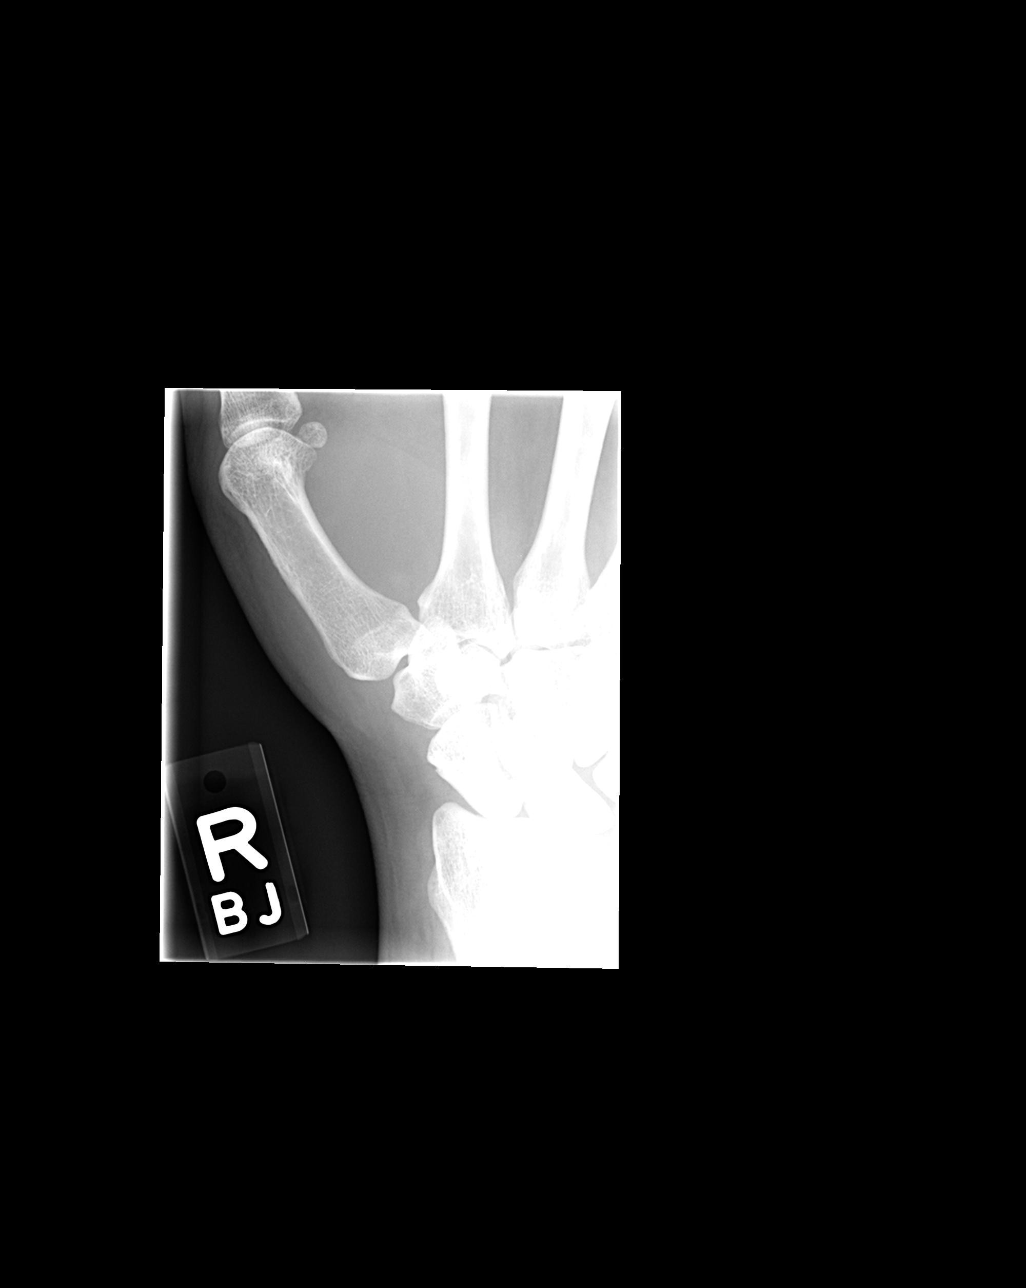

[4 of 4 positions shown; findings below may reference images not displayed]

IMPRESSION: No significant abnormality.

## 2005-08-25 ENCOUNTER — Emergency Department (HOSPITAL_COMMUNITY): Admission: EM | Admit: 2005-08-25 | Discharge: 2005-08-25 | Payer: Self-pay | Admitting: Emergency Medicine

## 2005-08-28 ENCOUNTER — Emergency Department (HOSPITAL_COMMUNITY): Admission: EM | Admit: 2005-08-28 | Discharge: 2005-08-28 | Payer: Self-pay | Admitting: Emergency Medicine

## 2005-09-07 ENCOUNTER — Emergency Department (HOSPITAL_COMMUNITY): Admission: EM | Admit: 2005-09-07 | Discharge: 2005-09-07 | Payer: Self-pay | Admitting: Emergency Medicine

## 2005-10-04 ENCOUNTER — Emergency Department (HOSPITAL_COMMUNITY): Admission: EM | Admit: 2005-10-04 | Discharge: 2005-10-04 | Payer: Self-pay | Admitting: Emergency Medicine

## 2005-10-19 IMAGING — CR DG TOE GREAT 2+V*R*
2 series · 2 of 2 positions shown · non-contrast
Comparison: none

CLINICAL DATA: Injured 1 day ago, pain. 
 RIGHT GREAT TOE:
 There are no fractures or dislocations.  There are degenerative arthritic changes seen associated with the 1st metatarsophalangeal joint.

[view not recorded (1 of 2)]
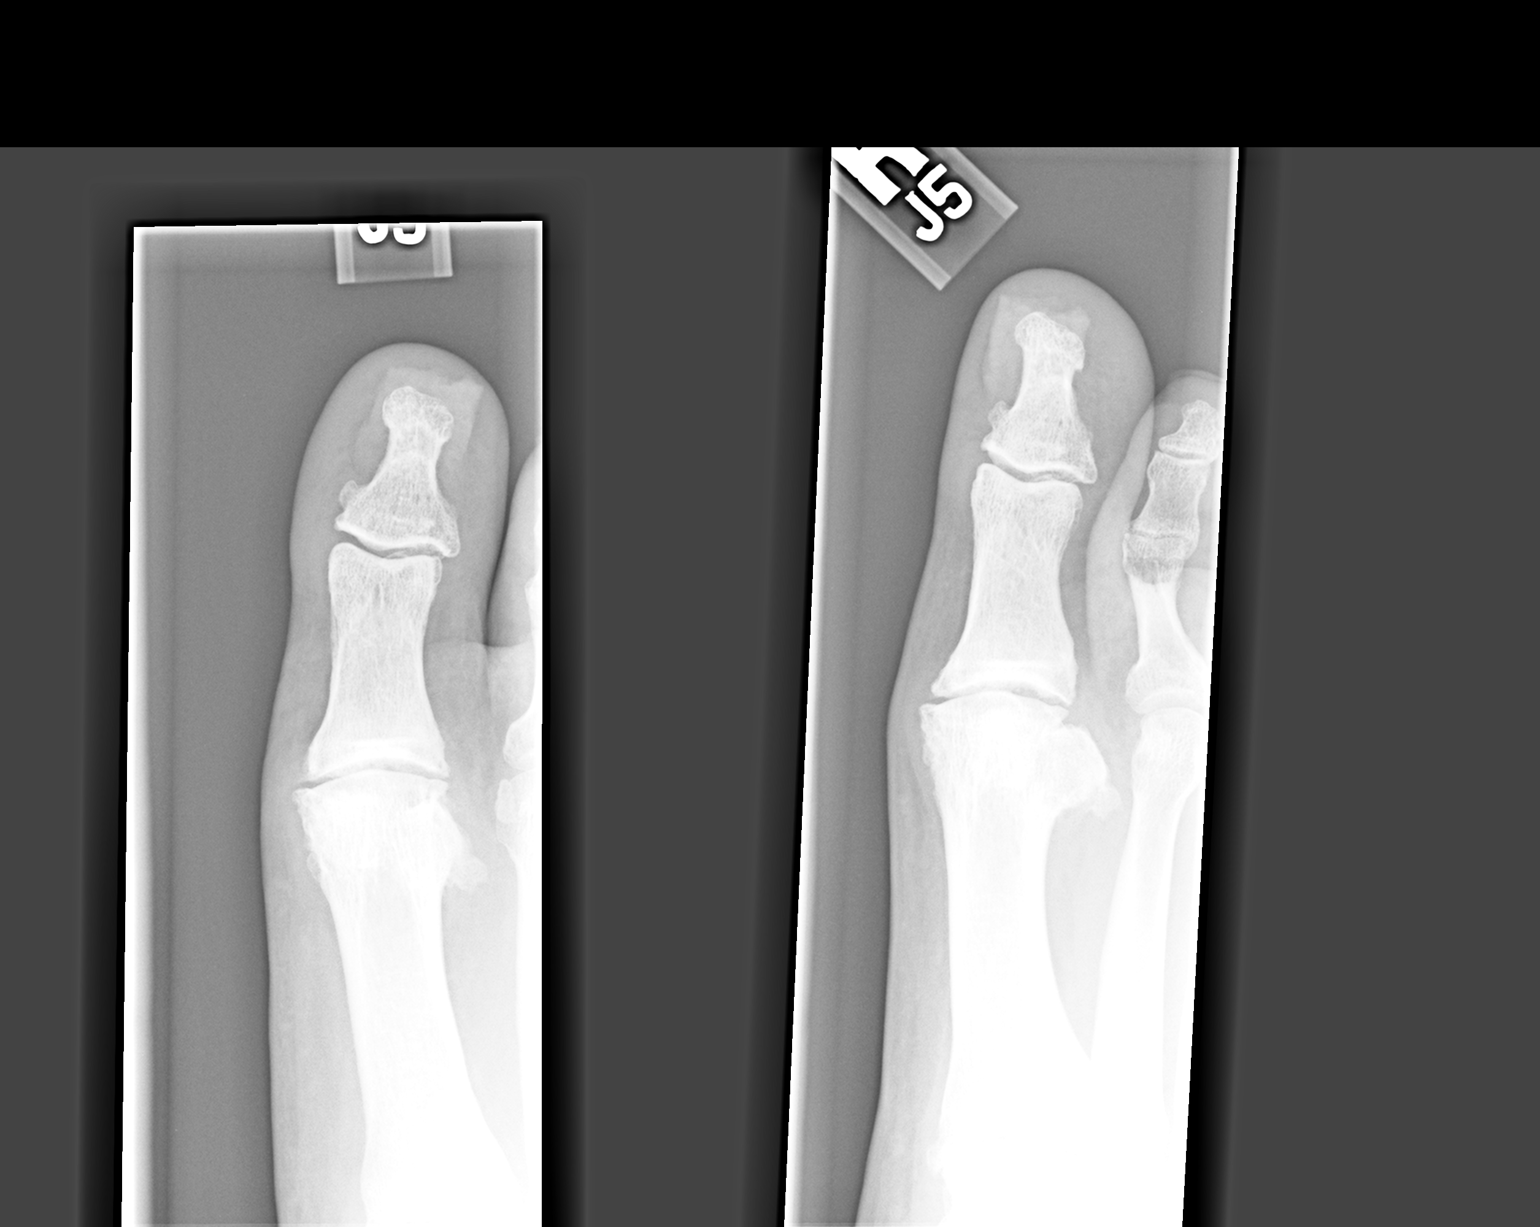

[view not recorded (2 of 2)]
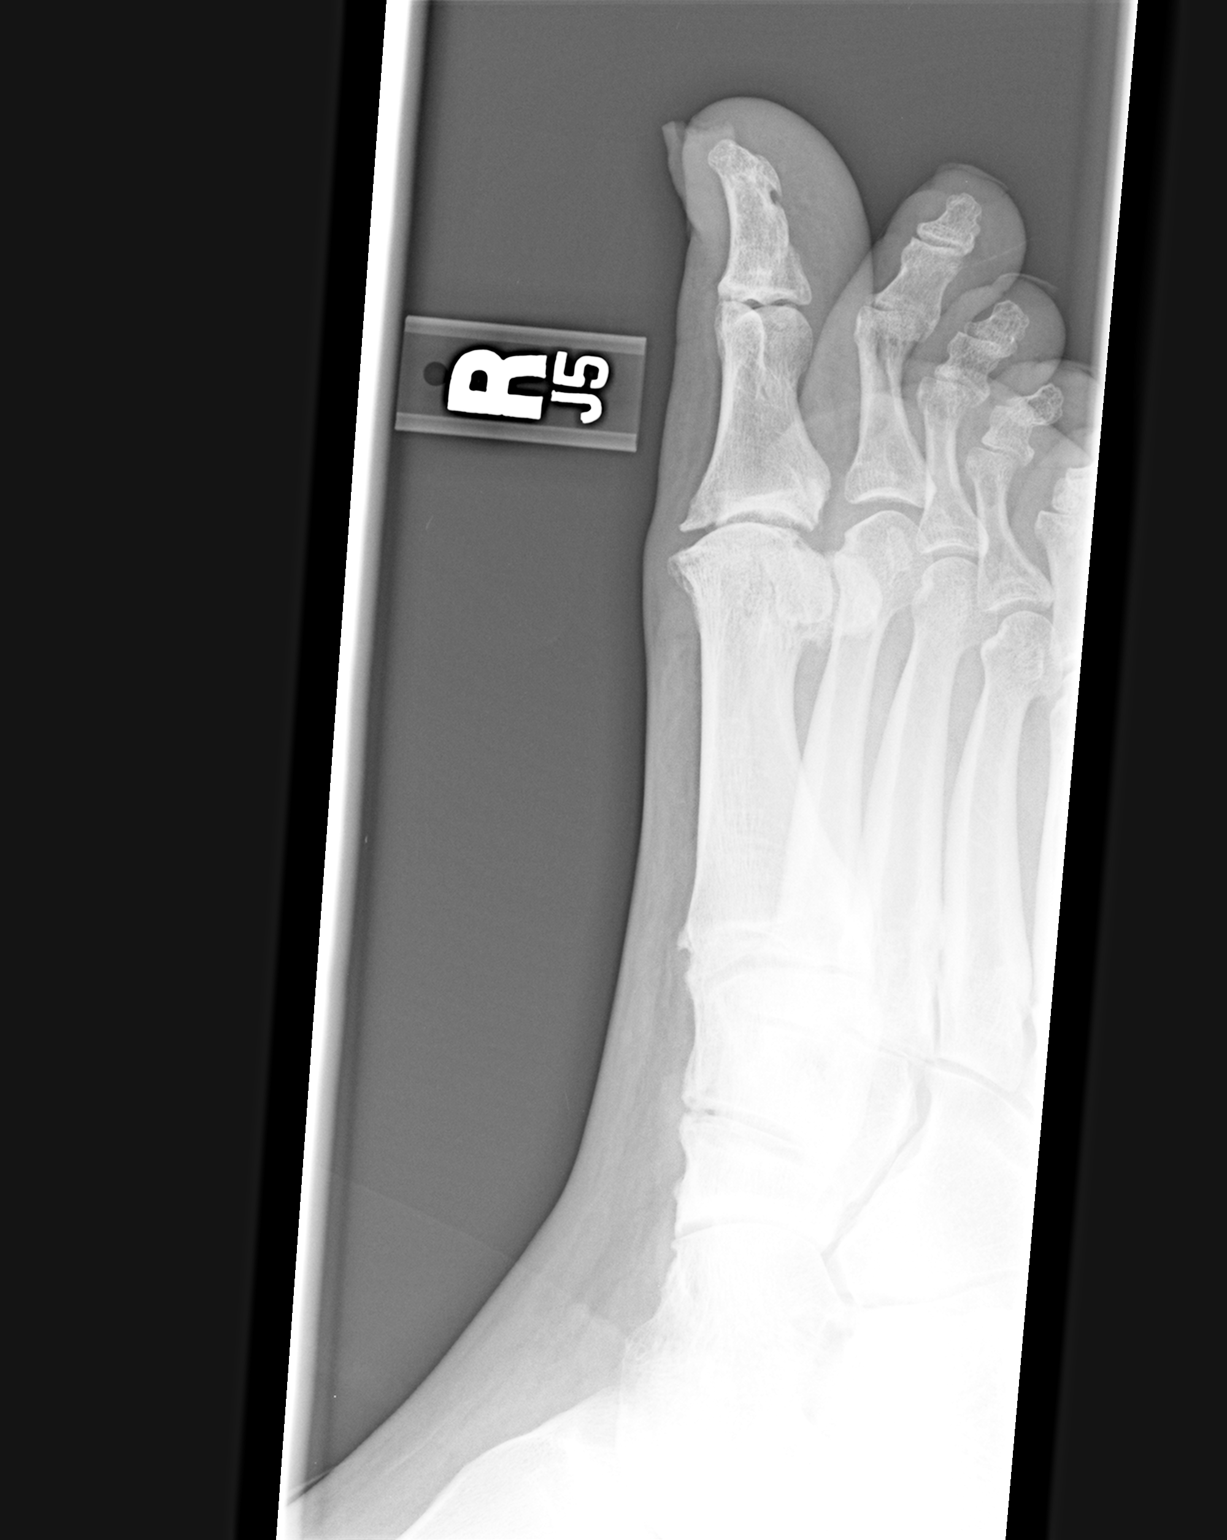

[2 of 2 positions shown; findings below may reference images not displayed]

IMPRESSION: Degenerative arthritic changes.  No acute bony injury.

## 2005-11-04 ENCOUNTER — Emergency Department (HOSPITAL_COMMUNITY): Admission: EM | Admit: 2005-11-04 | Discharge: 2005-11-04 | Payer: Self-pay | Admitting: Emergency Medicine

## 2005-12-04 ENCOUNTER — Ambulatory Visit (HOSPITAL_COMMUNITY): Admission: RE | Admit: 2005-12-04 | Discharge: 2005-12-04 | Payer: Self-pay | Admitting: Nephrology

## 2005-12-15 ENCOUNTER — Emergency Department (HOSPITAL_COMMUNITY): Admission: EM | Admit: 2005-12-15 | Discharge: 2005-12-15 | Payer: Self-pay | Admitting: Emergency Medicine

## 2006-01-14 IMAGING — CR DG KNEE COMPLETE 4+V*R*
4 series · 4 of 4 positions shown · non-contrast
Comparison: none

CLINICAL DATA: Right knee pain.
 RIGHT KNEE - 4 VIEW:
 There is some motion artifact and difficulties in positioning on this exam due to the patient?s inability to cooperate with imaging.
 Tricompartmental spurring and osteoarthritis are present.  It is difficult to assess the articular space loss given the postioning.  Knee effusion is present.  No visible fracture.

[view not recorded (1 of 4)]
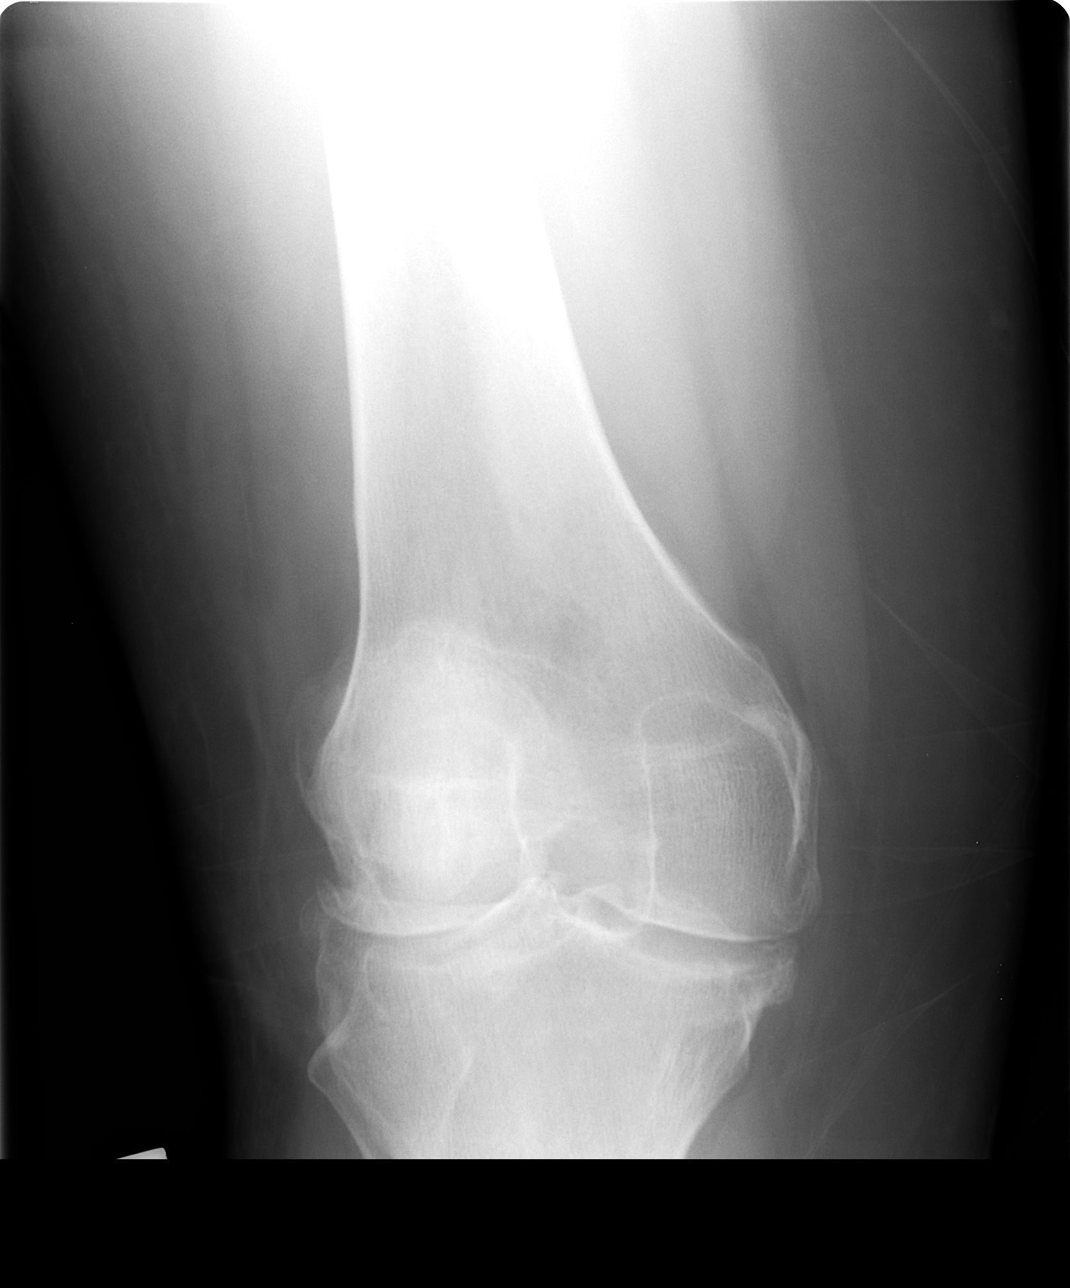

[view not recorded (2 of 4)]
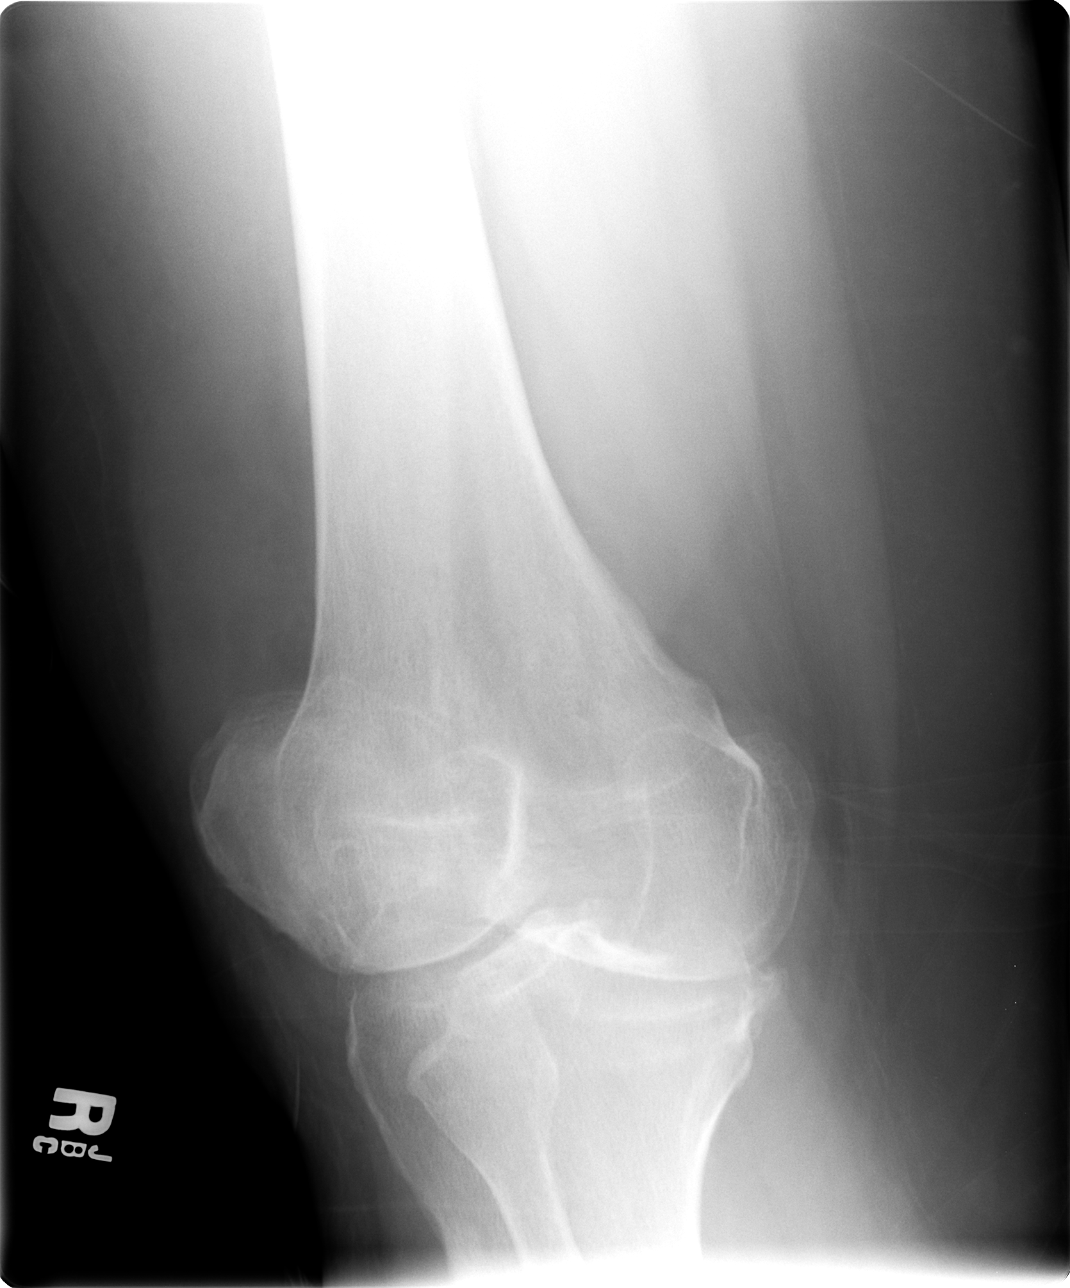

[view not recorded (3 of 4)]
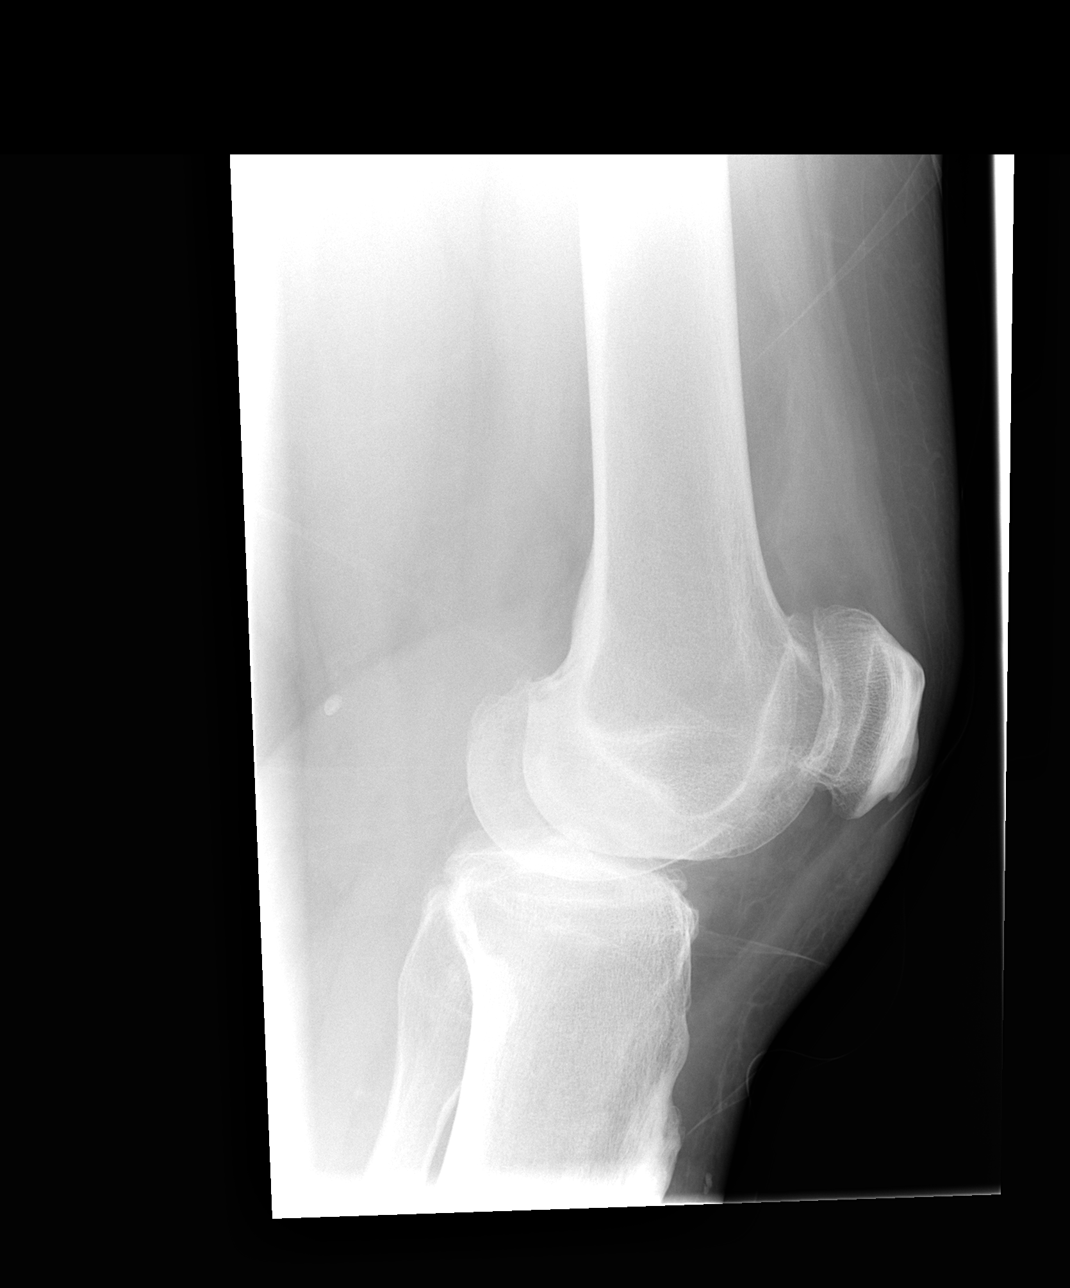

[view not recorded (4 of 4)]
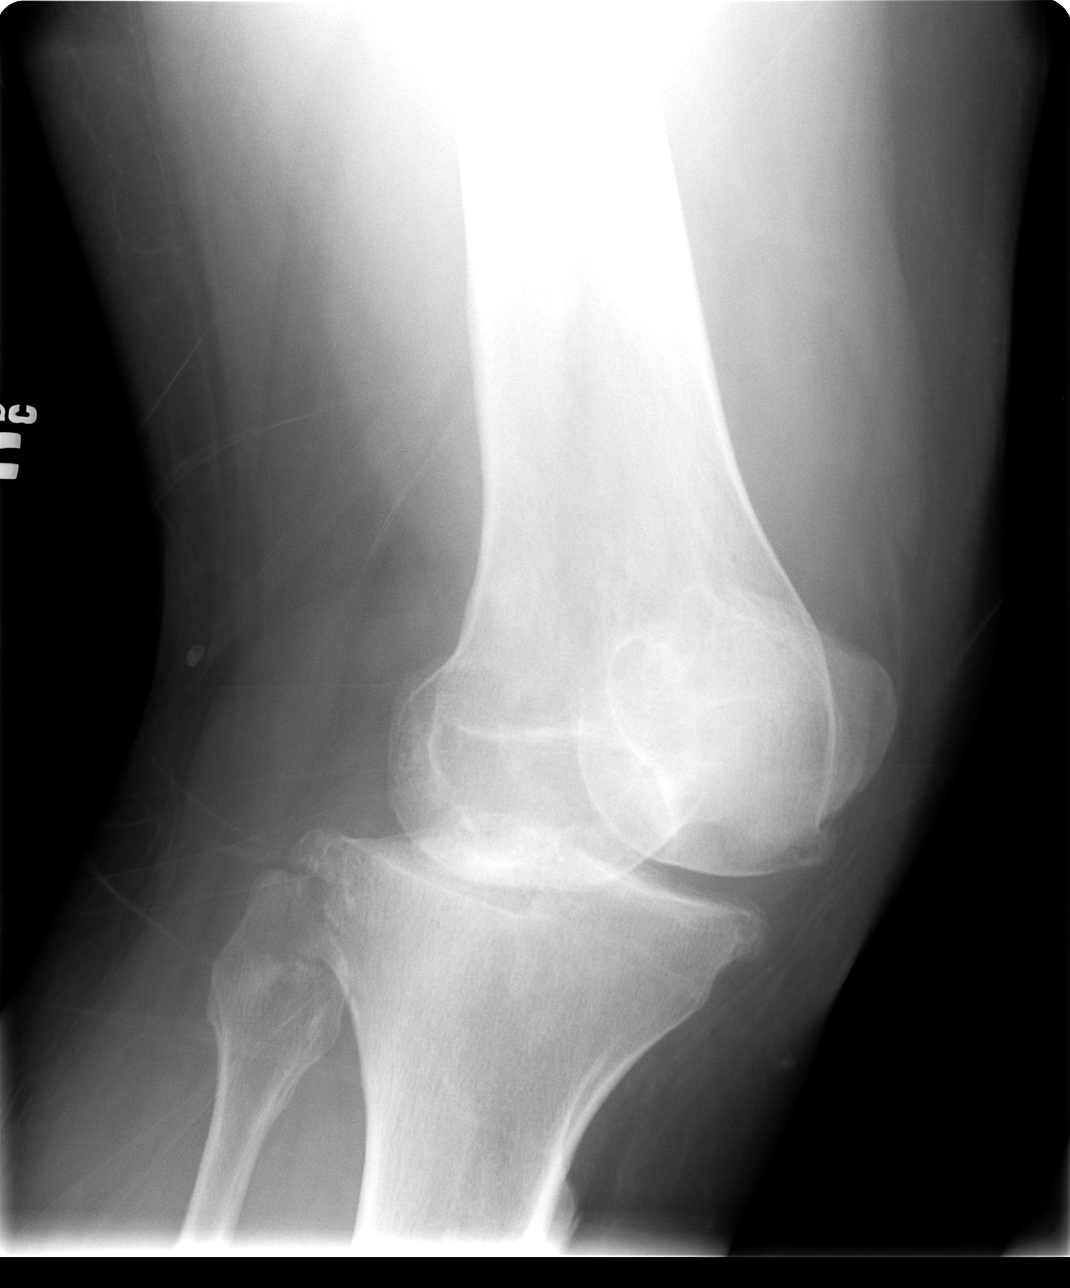

[4 of 4 positions shown; findings below may reference images not displayed]

IMPRESSION: Prominent tricompartmental osteoarthritis.  Knee effusion.

## 2006-01-17 ENCOUNTER — Emergency Department (HOSPITAL_COMMUNITY): Admission: EM | Admit: 2006-01-17 | Discharge: 2006-01-17 | Payer: Self-pay | Admitting: Emergency Medicine

## 2006-01-25 ENCOUNTER — Emergency Department (HOSPITAL_COMMUNITY): Admission: EM | Admit: 2006-01-25 | Discharge: 2006-01-26 | Payer: Self-pay | Admitting: Emergency Medicine

## 2006-02-28 ENCOUNTER — Emergency Department (HOSPITAL_COMMUNITY): Admission: EM | Admit: 2006-02-28 | Discharge: 2006-02-28 | Payer: Self-pay | Admitting: Emergency Medicine

## 2006-03-07 ENCOUNTER — Emergency Department (HOSPITAL_COMMUNITY): Admission: EM | Admit: 2006-03-07 | Discharge: 2006-03-07 | Payer: Self-pay | Admitting: Emergency Medicine

## 2006-03-25 ENCOUNTER — Emergency Department (HOSPITAL_COMMUNITY): Admission: EM | Admit: 2006-03-25 | Discharge: 2006-03-25 | Payer: Self-pay | Admitting: Emergency Medicine

## 2006-03-28 ENCOUNTER — Emergency Department (HOSPITAL_COMMUNITY): Admission: EM | Admit: 2006-03-28 | Discharge: 2006-03-28 | Payer: Self-pay | Admitting: Emergency Medicine

## 2006-04-10 ENCOUNTER — Emergency Department (HOSPITAL_COMMUNITY): Admission: EM | Admit: 2006-04-10 | Discharge: 2006-04-10 | Payer: Self-pay | Admitting: Emergency Medicine

## 2006-04-18 ENCOUNTER — Emergency Department (HOSPITAL_COMMUNITY): Admission: EM | Admit: 2006-04-18 | Discharge: 2006-04-19 | Payer: Self-pay | Admitting: Emergency Medicine

## 2006-05-29 ENCOUNTER — Emergency Department (HOSPITAL_COMMUNITY): Admission: EM | Admit: 2006-05-29 | Discharge: 2006-05-30 | Payer: Self-pay | Admitting: Emergency Medicine

## 2006-06-17 ENCOUNTER — Emergency Department (HOSPITAL_COMMUNITY): Admission: EM | Admit: 2006-06-17 | Discharge: 2006-06-17 | Payer: Self-pay | Admitting: *Deleted

## 2006-06-27 ENCOUNTER — Emergency Department (HOSPITAL_COMMUNITY): Admission: EM | Admit: 2006-06-27 | Discharge: 2006-06-27 | Payer: Self-pay | Admitting: Emergency Medicine

## 2006-07-18 IMAGING — CR DG CHEST 2V
3 series · 3 of 3 positions shown · non-contrast
Comparison: None.

CLINICAL DATA: Anterior, upper chest pain since 01/09/2005.

CHEST - 2 VIEW

[view not recorded (1 of 3)]
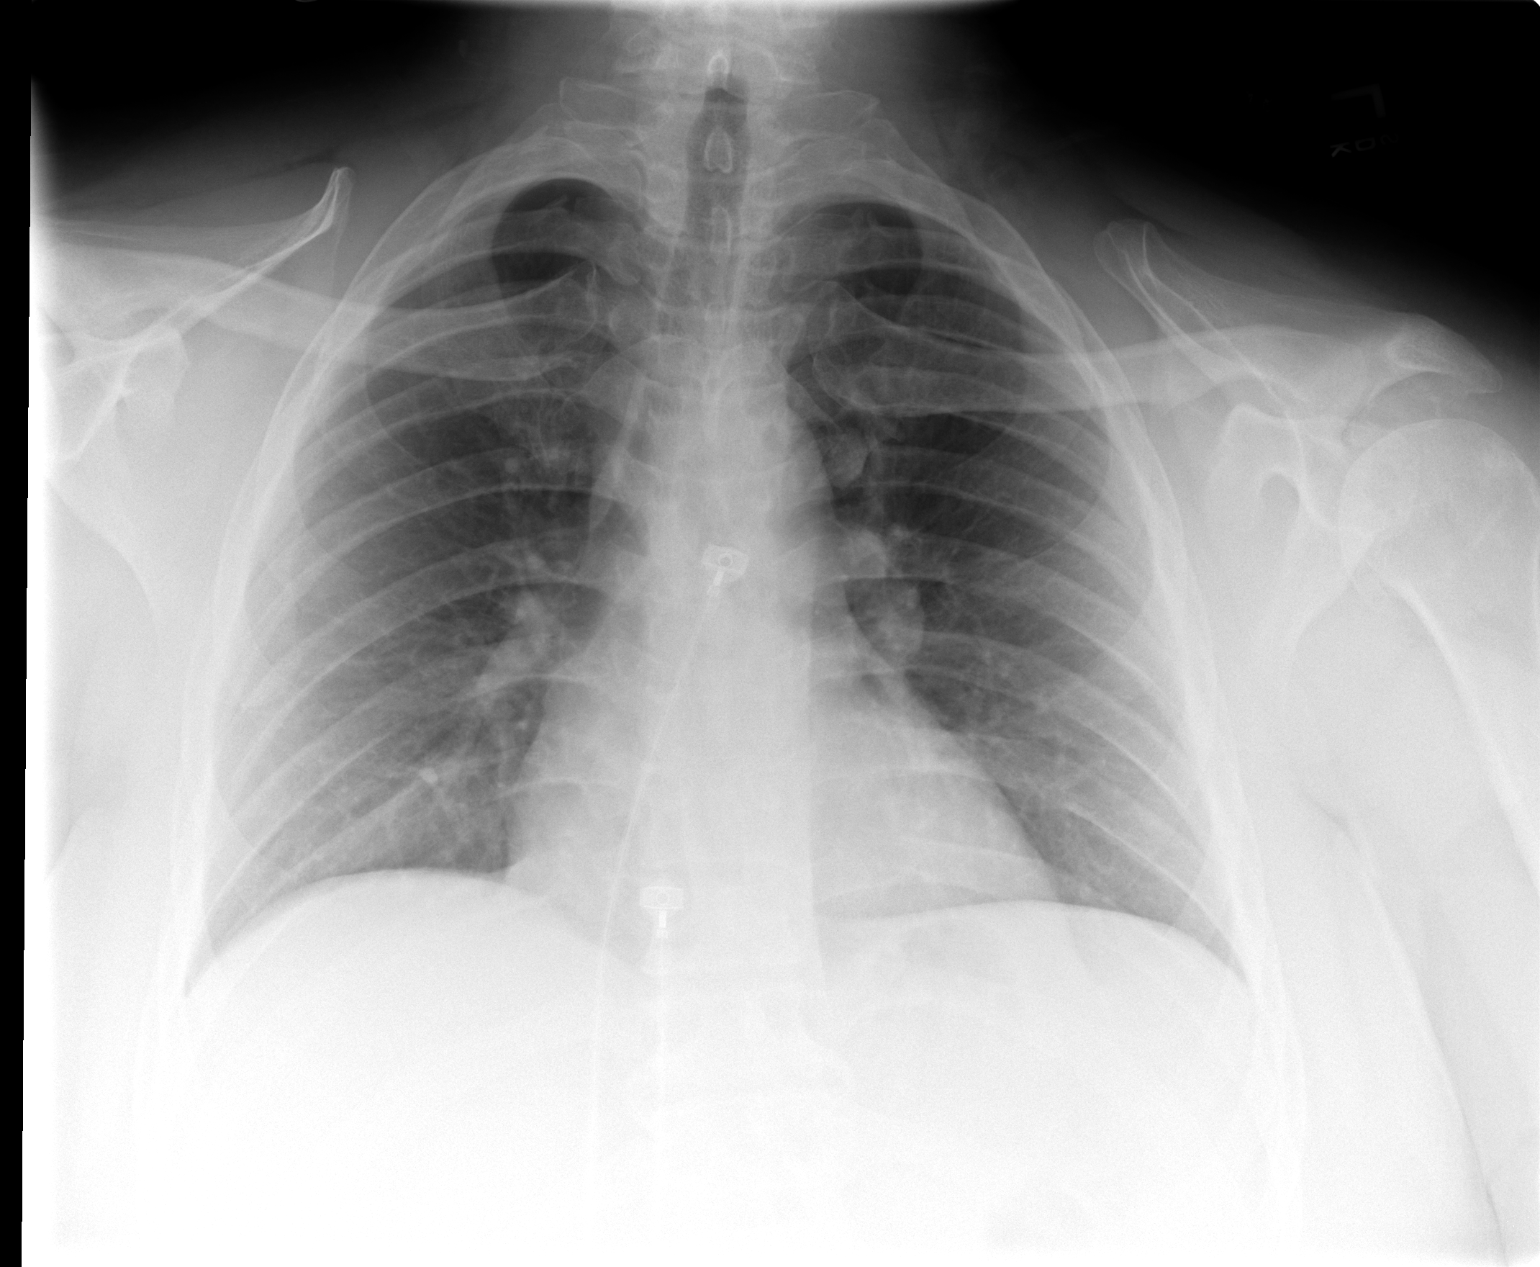

[view not recorded (2 of 3)]
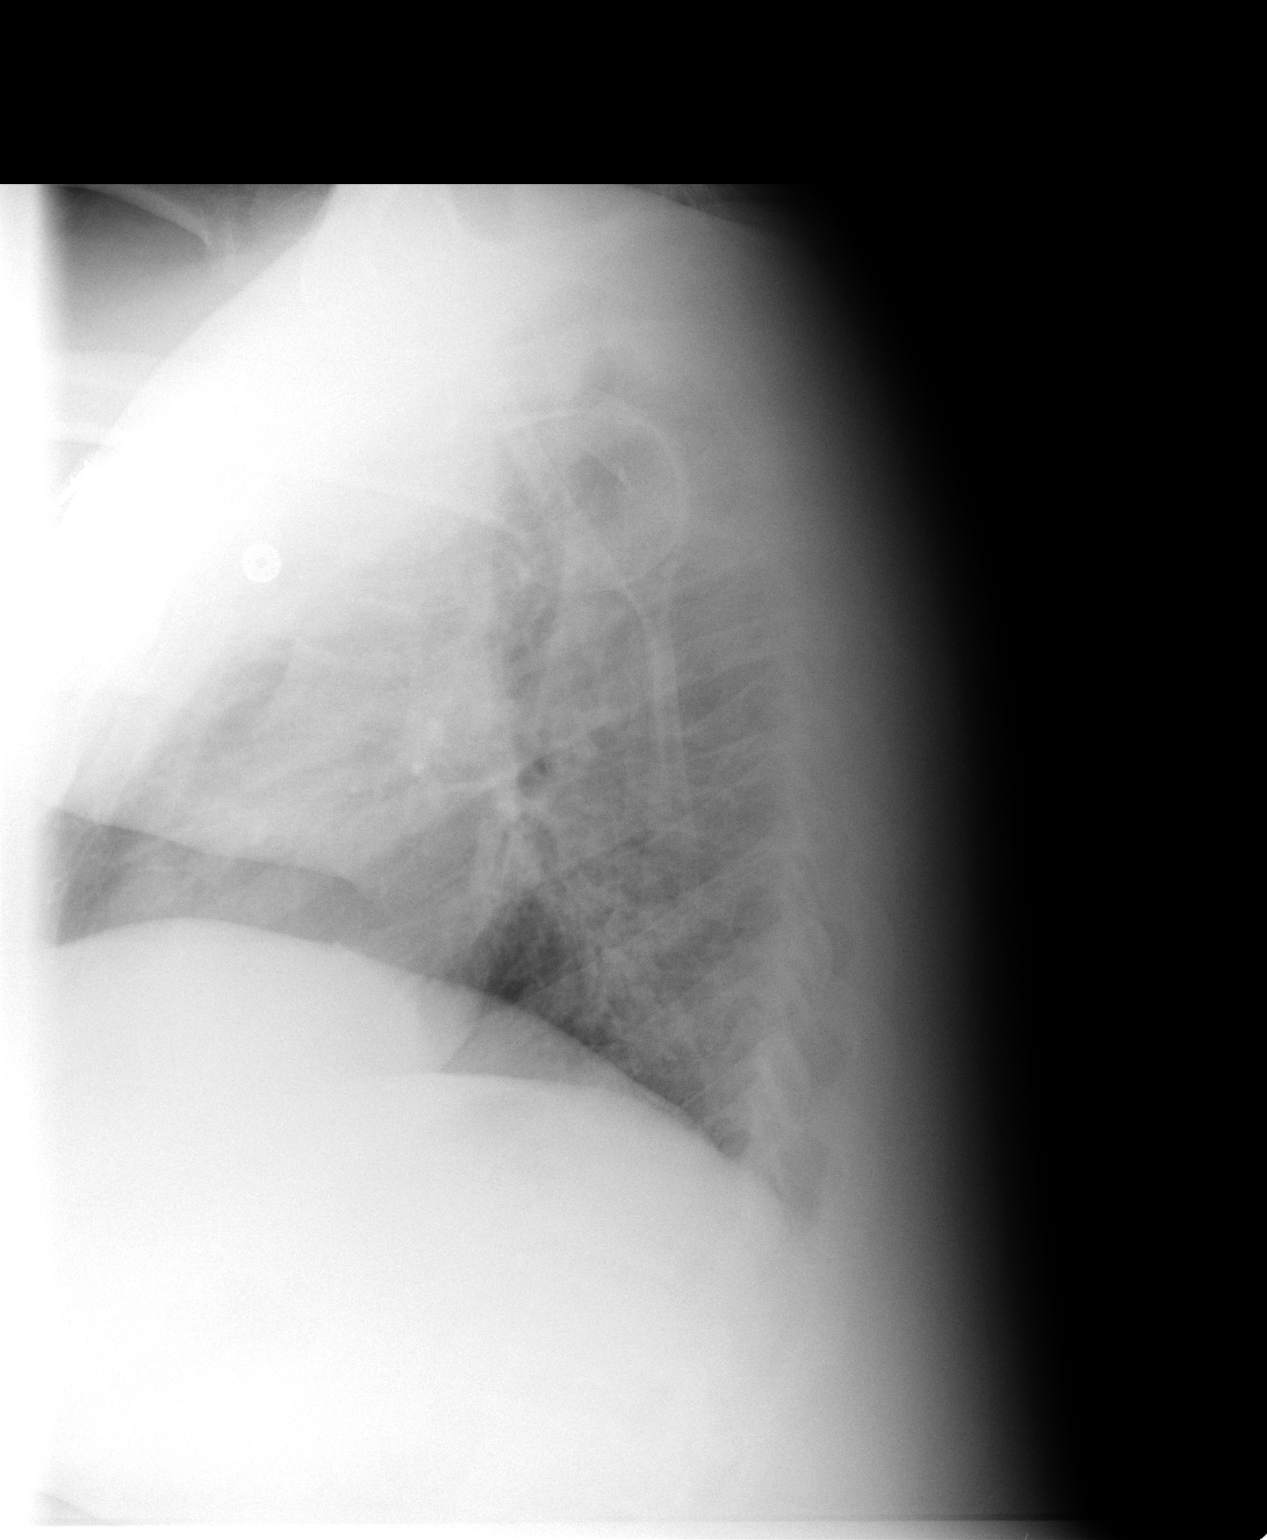

[view not recorded (3 of 3)]
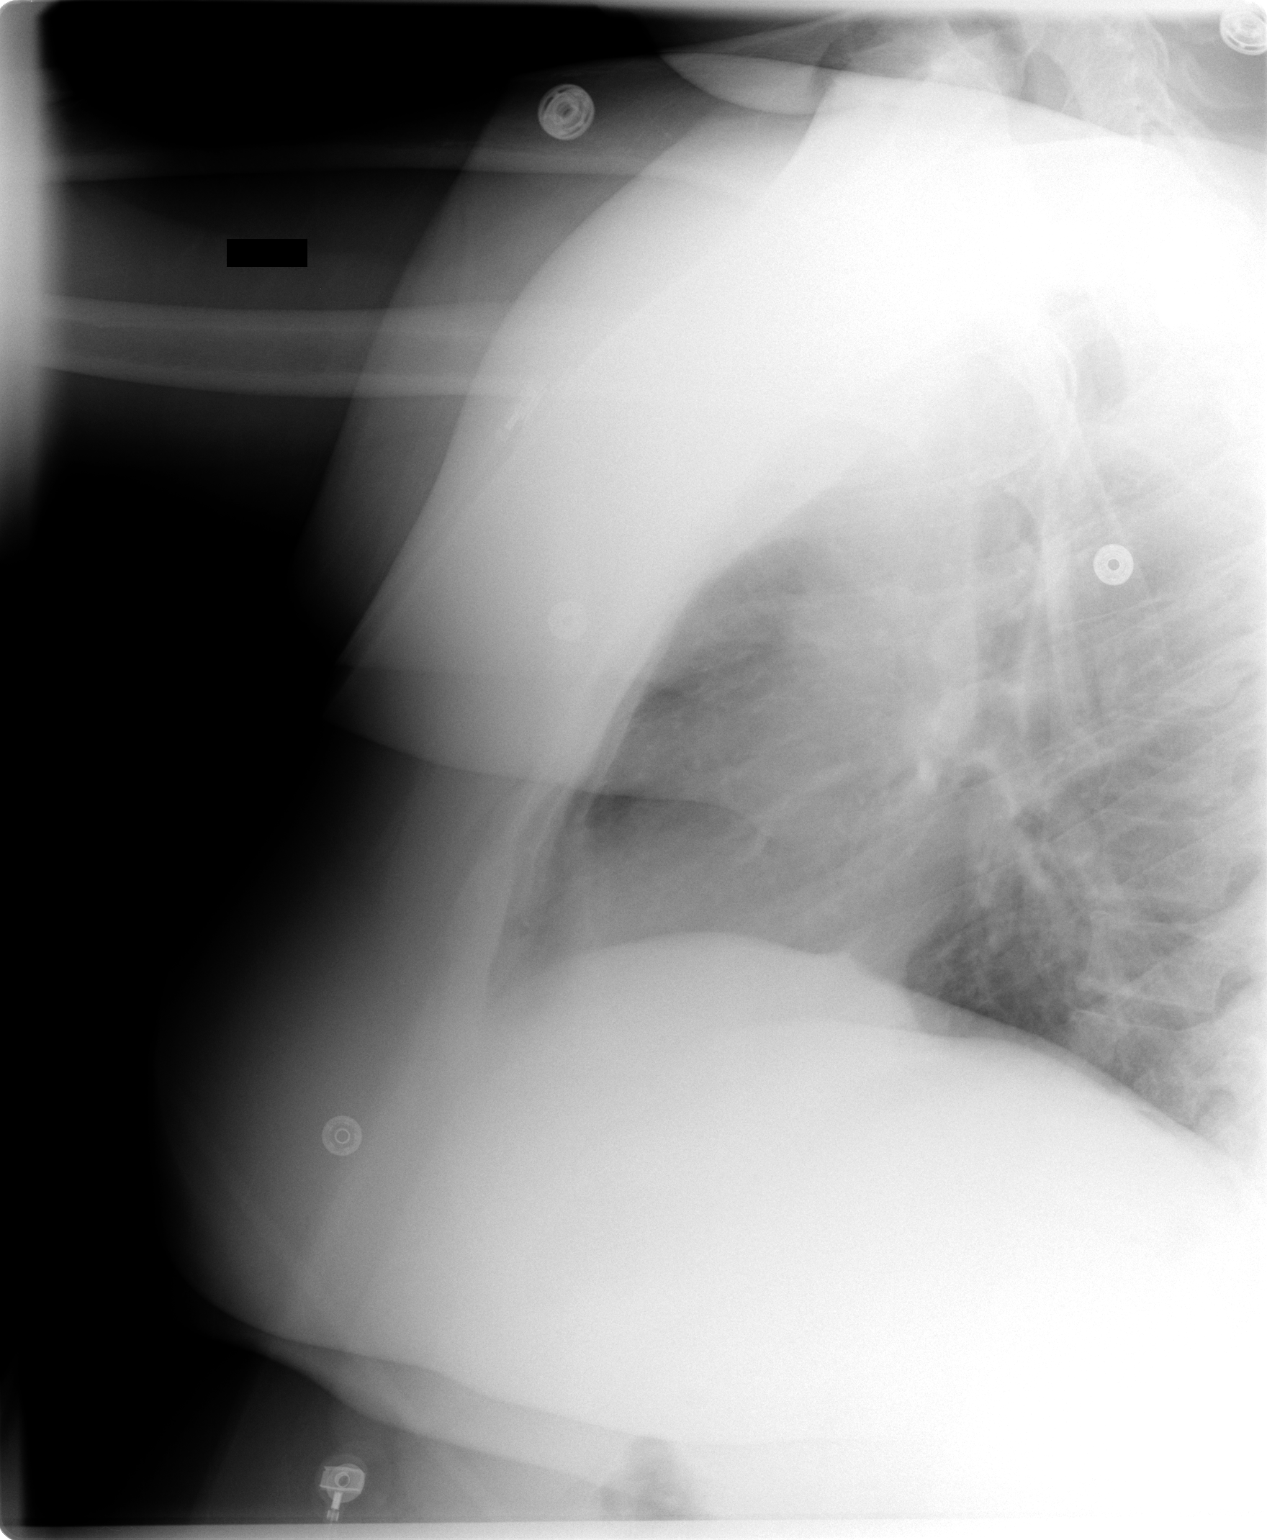

[3 of 3 positions shown; findings below may reference images not displayed]

FINDINGS: Borderline enlarged cardiac silhouette. Clear lungs with normal
vascularity. Minimal scoliosis. Degenerative changes in the lower thoracic
spine.

IMPRESSION

Borderline cardiomegaly. No acute abnormality.

## 2006-07-18 IMAGING — CT CT ANGIO CHEST
2 of 4 series · 19 of 36 positions shown · IV contrast (CONTRAST)
Comparison: none

CLINICAL DATA: Shortness of breath, chest pain, elevated d-dimer

[Series 7601: — · axial · 0.61mm/px · z∈[+1647,+1823]mm · 16 of 323 slices shown (1 of 2)]
[im 15/323  lung]
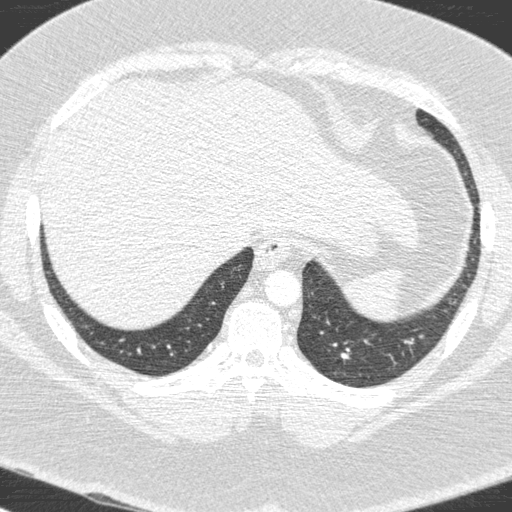
[im 30/323  mediastinal]
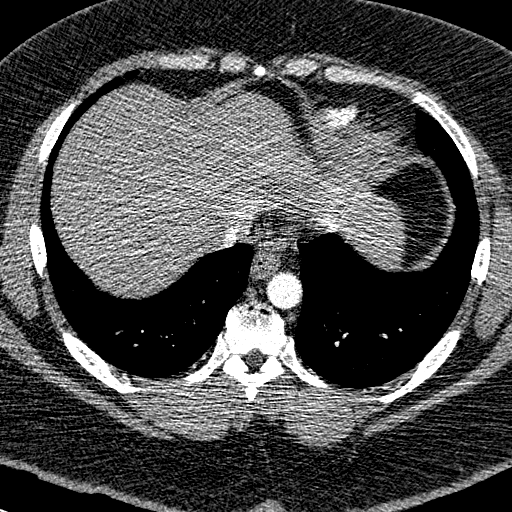
[im 59/323  lung]
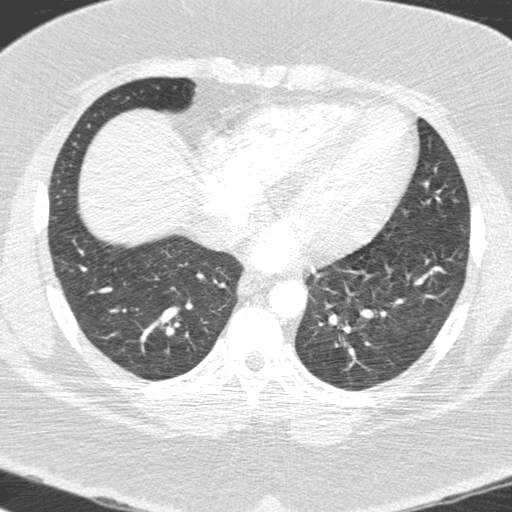
[im 74/323  mediastinal]
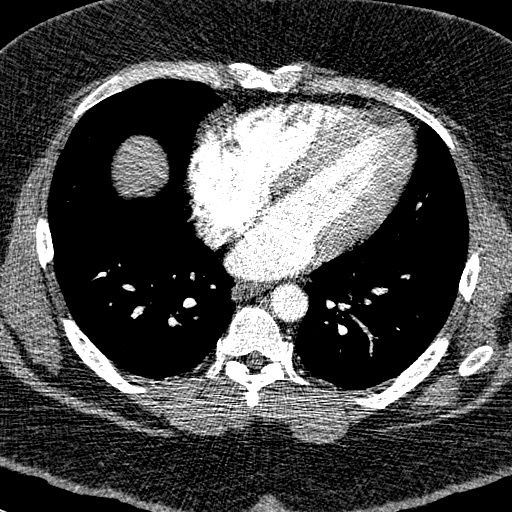
[im 88/323  lung]
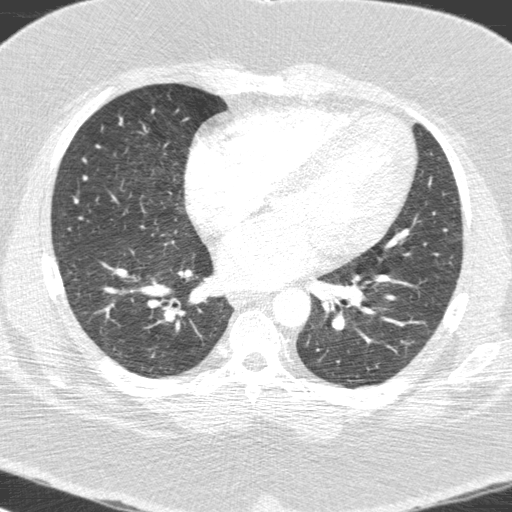
[im 118/323  mediastinal]
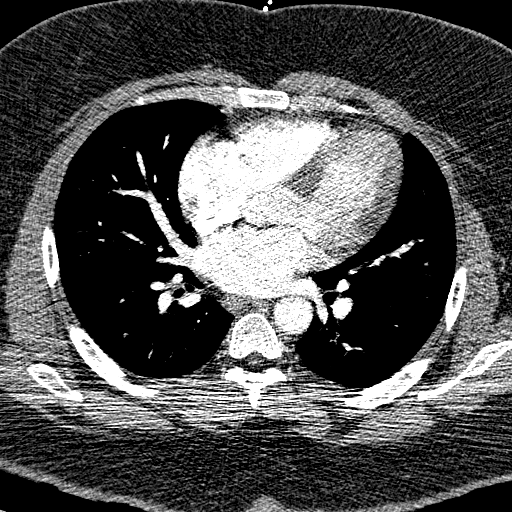
[im 132/323  lung]
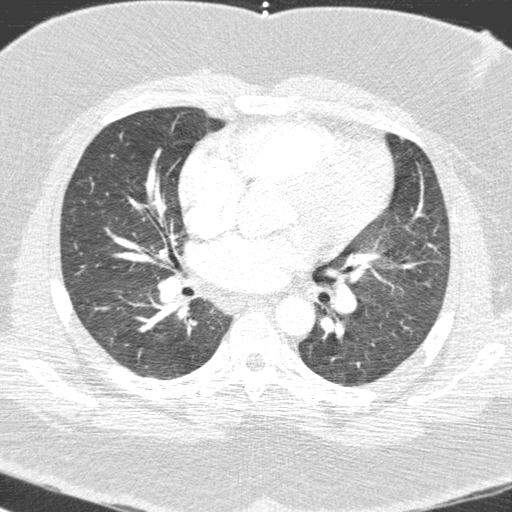
[im 147/323  mediastinal]
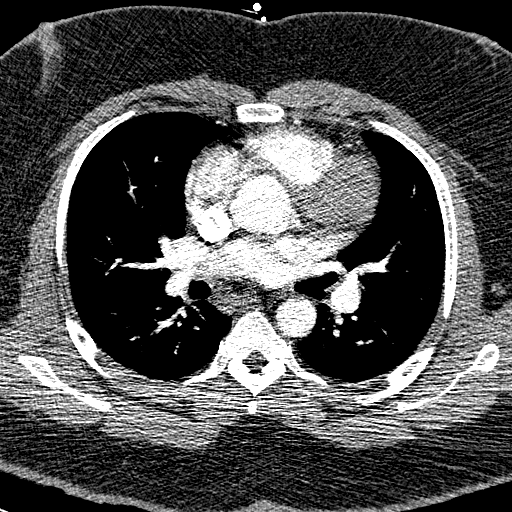
[im 176/323  lung]
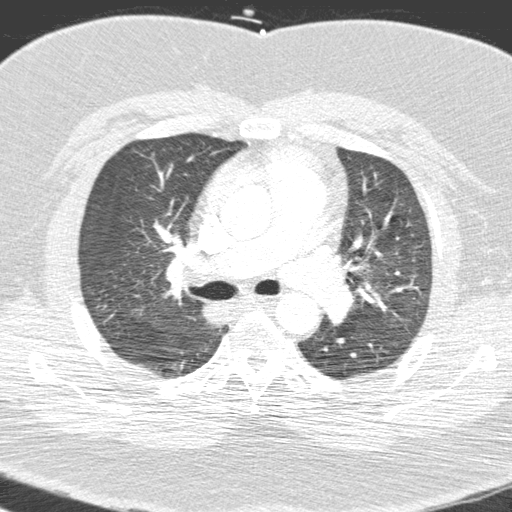
[im 191/323  mediastinal]
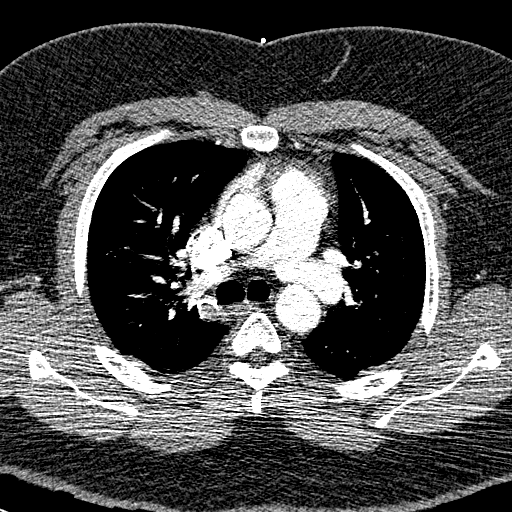
[im 205/323  lung]
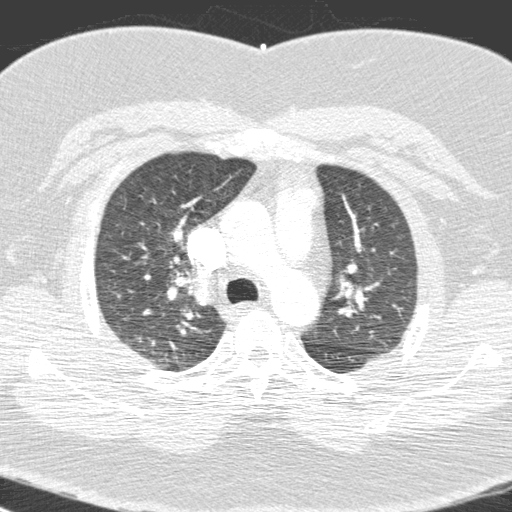
[im 235/323  mediastinal]
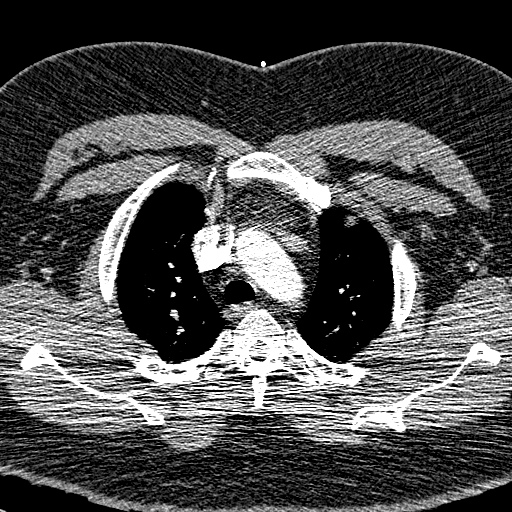
[im 249/323  lung]
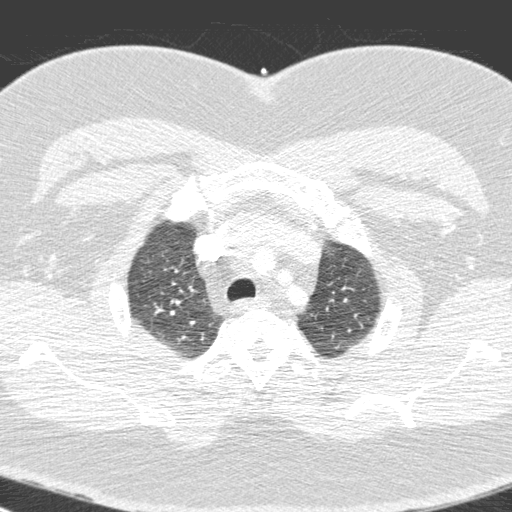
[im 264/323  mediastinal]
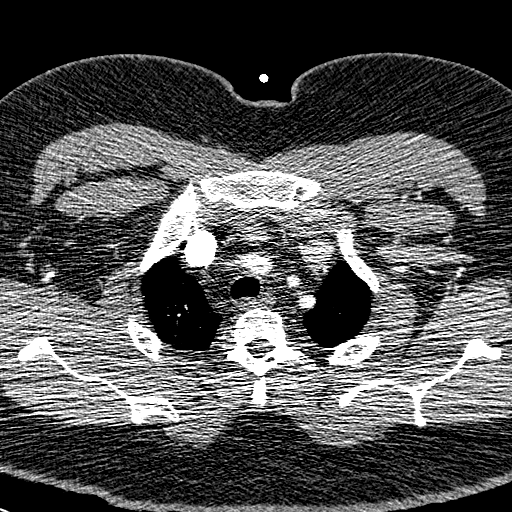
[im 293/323  lung]
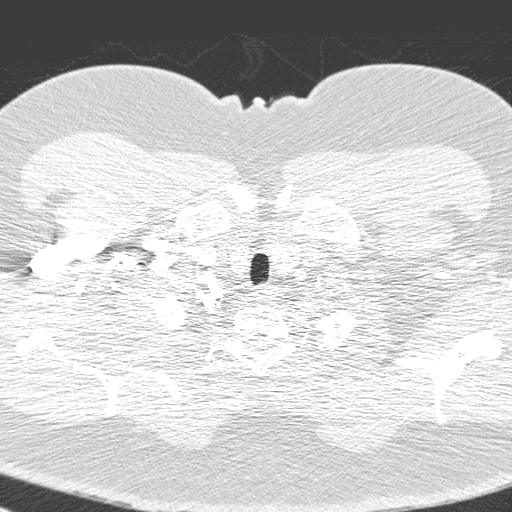
[im 308/323  mediastinal]
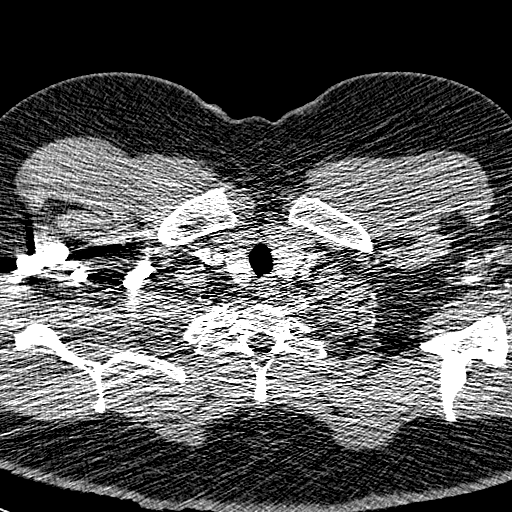

[— · coronal · 0.61mm/px · 3 of 60 slices shown (2 of 2)]
[im 12/60  mediastinal]
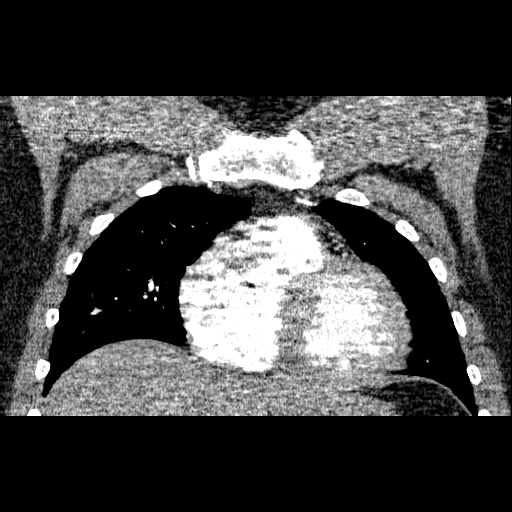
[im 24/60  mediastinal]
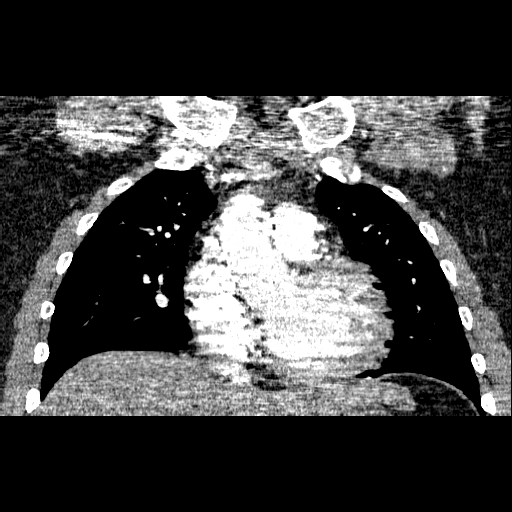
[im 36/60  mediastinal]
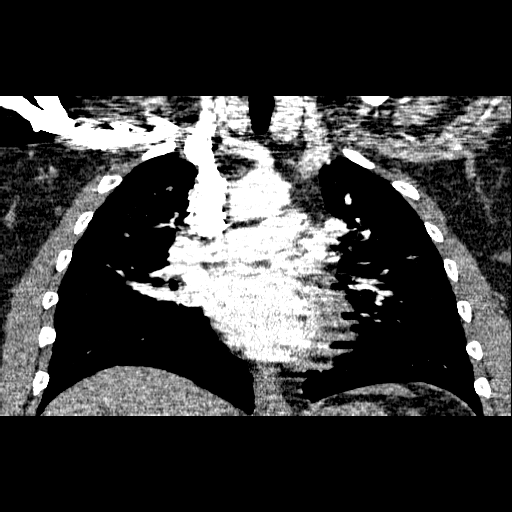

[19 of 36 positions shown; findings below may reference images not displayed]

CT angiogram chest with contrast:

Multidetector helical CT of the chest was obtained after 150 ml Omnipaque 300 
IV. CT multiplanar reconstructions were rendered to evaluate the vascular
anatomy.
Fairly good contrast opacification of pulmonary artery branches. No convincing
filling defect to indicate acute pulmonary embolus. Mild image degradation
secondary to patient body habitus. No pleural or pericardial effusion. No hilar
or mediastinal adenopathy. Visualized portions of the upper abdomen
unremarkable. No confluent air space infiltrate or discrete pulmonary nodule.
Coronal and sagittal reconstructions confirm the above findings.
IMPRESSION: 1. Negative for acute PE.

## 2006-07-24 ENCOUNTER — Emergency Department (HOSPITAL_COMMUNITY): Admission: EM | Admit: 2006-07-24 | Discharge: 2006-07-24 | Payer: Self-pay | Admitting: Emergency Medicine

## 2006-08-14 ENCOUNTER — Emergency Department (HOSPITAL_COMMUNITY): Admission: EM | Admit: 2006-08-14 | Discharge: 2006-08-14 | Payer: Self-pay | Admitting: Emergency Medicine

## 2006-08-23 IMAGING — CR DG HAND COMPLETE 3+V*L*
3 series · 3 of 3 positions shown · non-contrast
Comparison: wrist x-rays 02/01/04
There is diffuse soft tissue swelling most prominent in the second finger.
COMPARISON: none
There appears to be some diffuse soft tissue swelling.

CLINICAL DATA: Bilateral hand swelling, left knee swelling.  History of gout.
RIGHT HAND- 3 VIEW:

[view not recorded (1 of 3)]
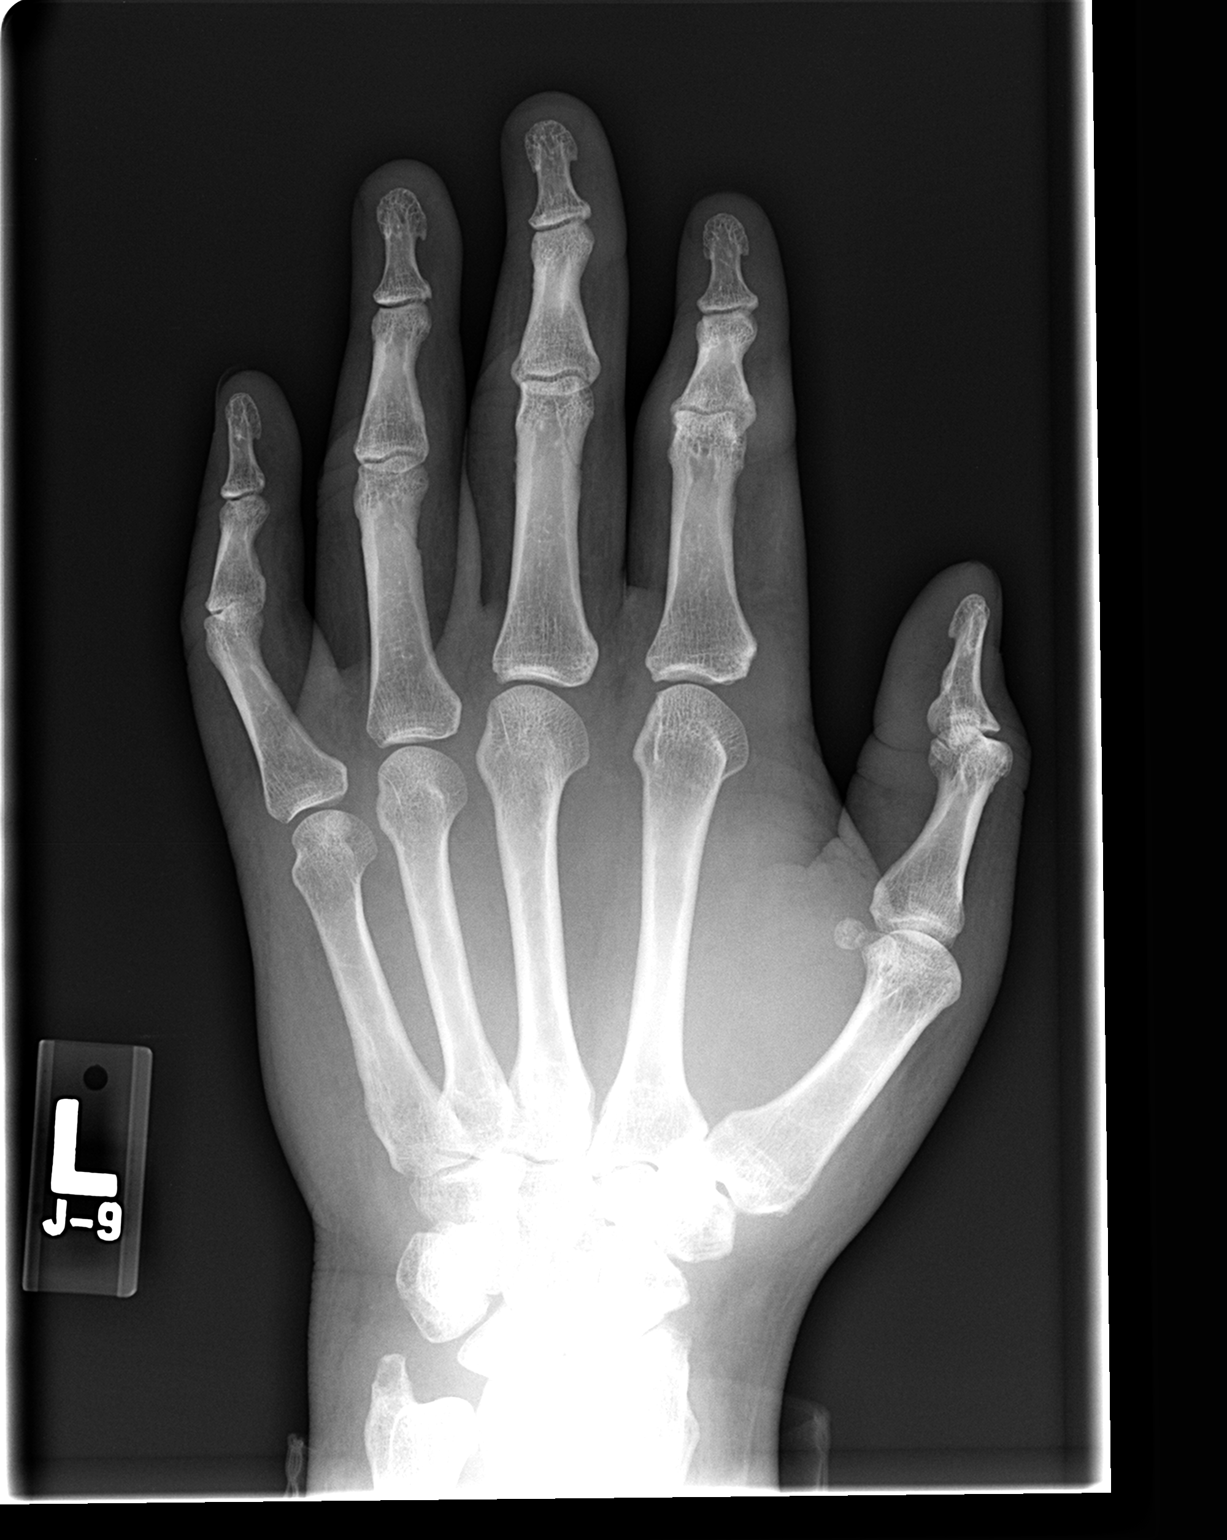

[view not recorded (2 of 3)]
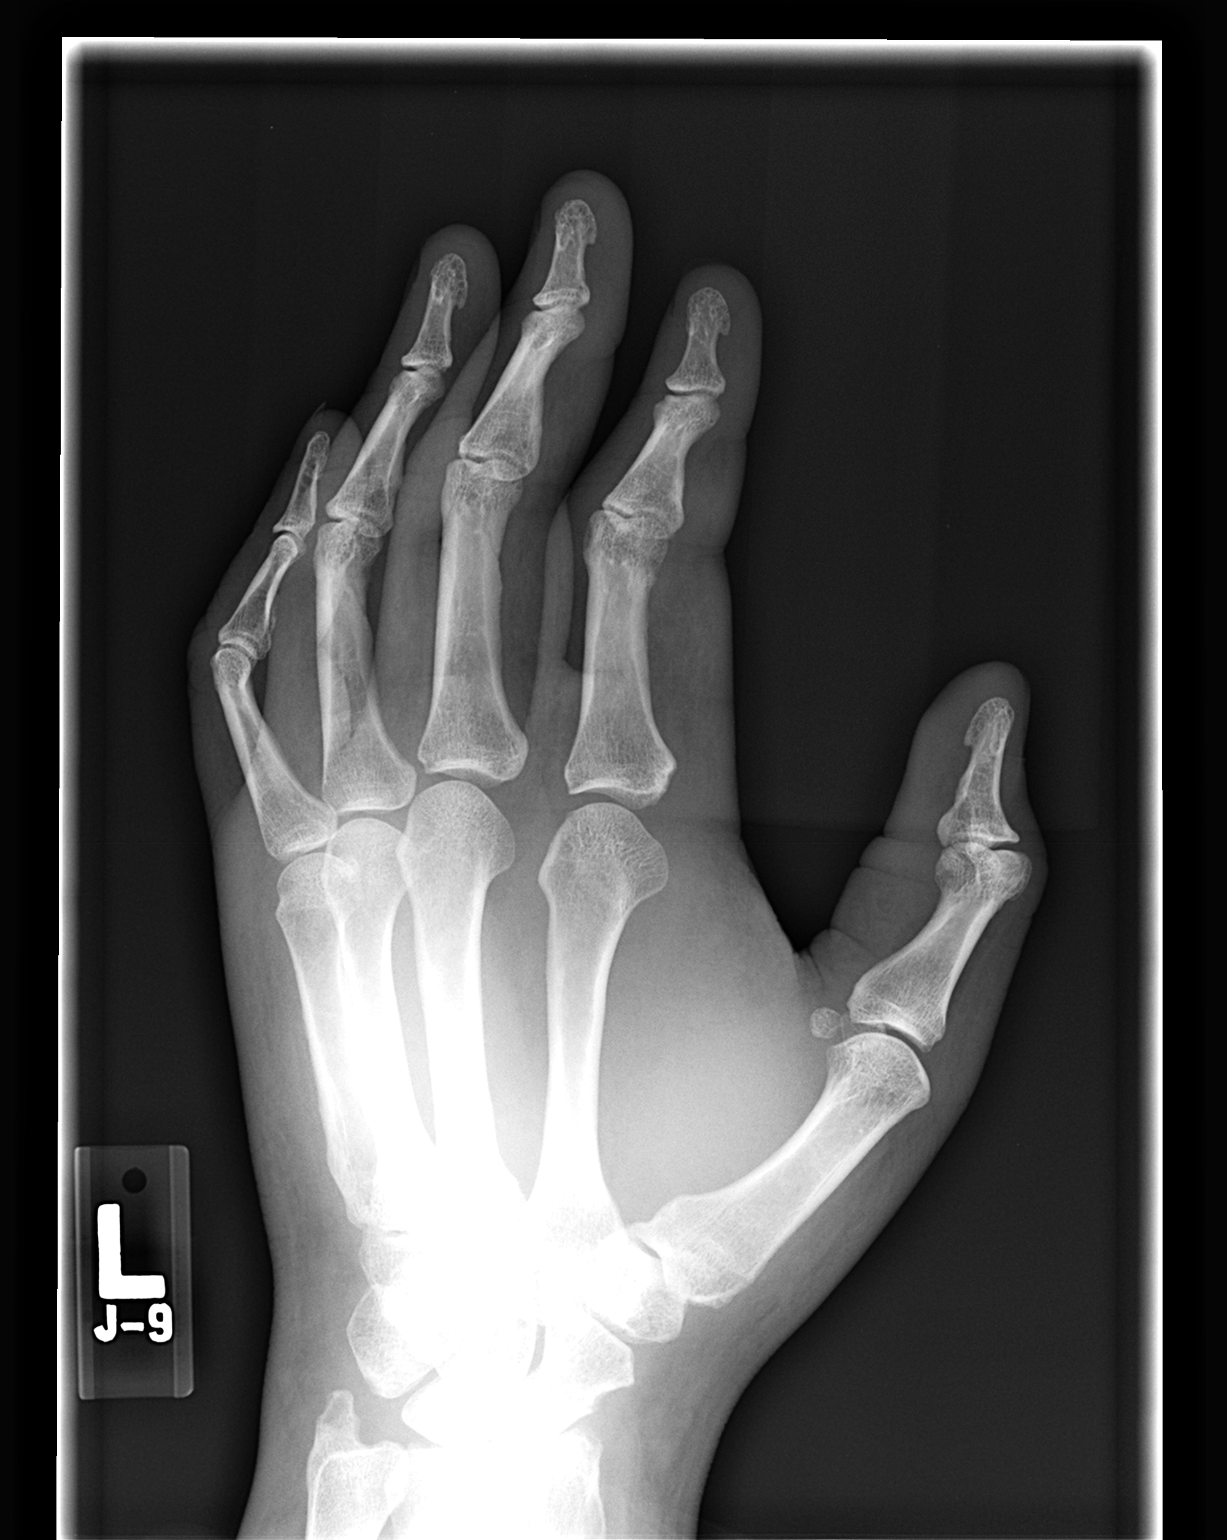

[view not recorded (3 of 3)]
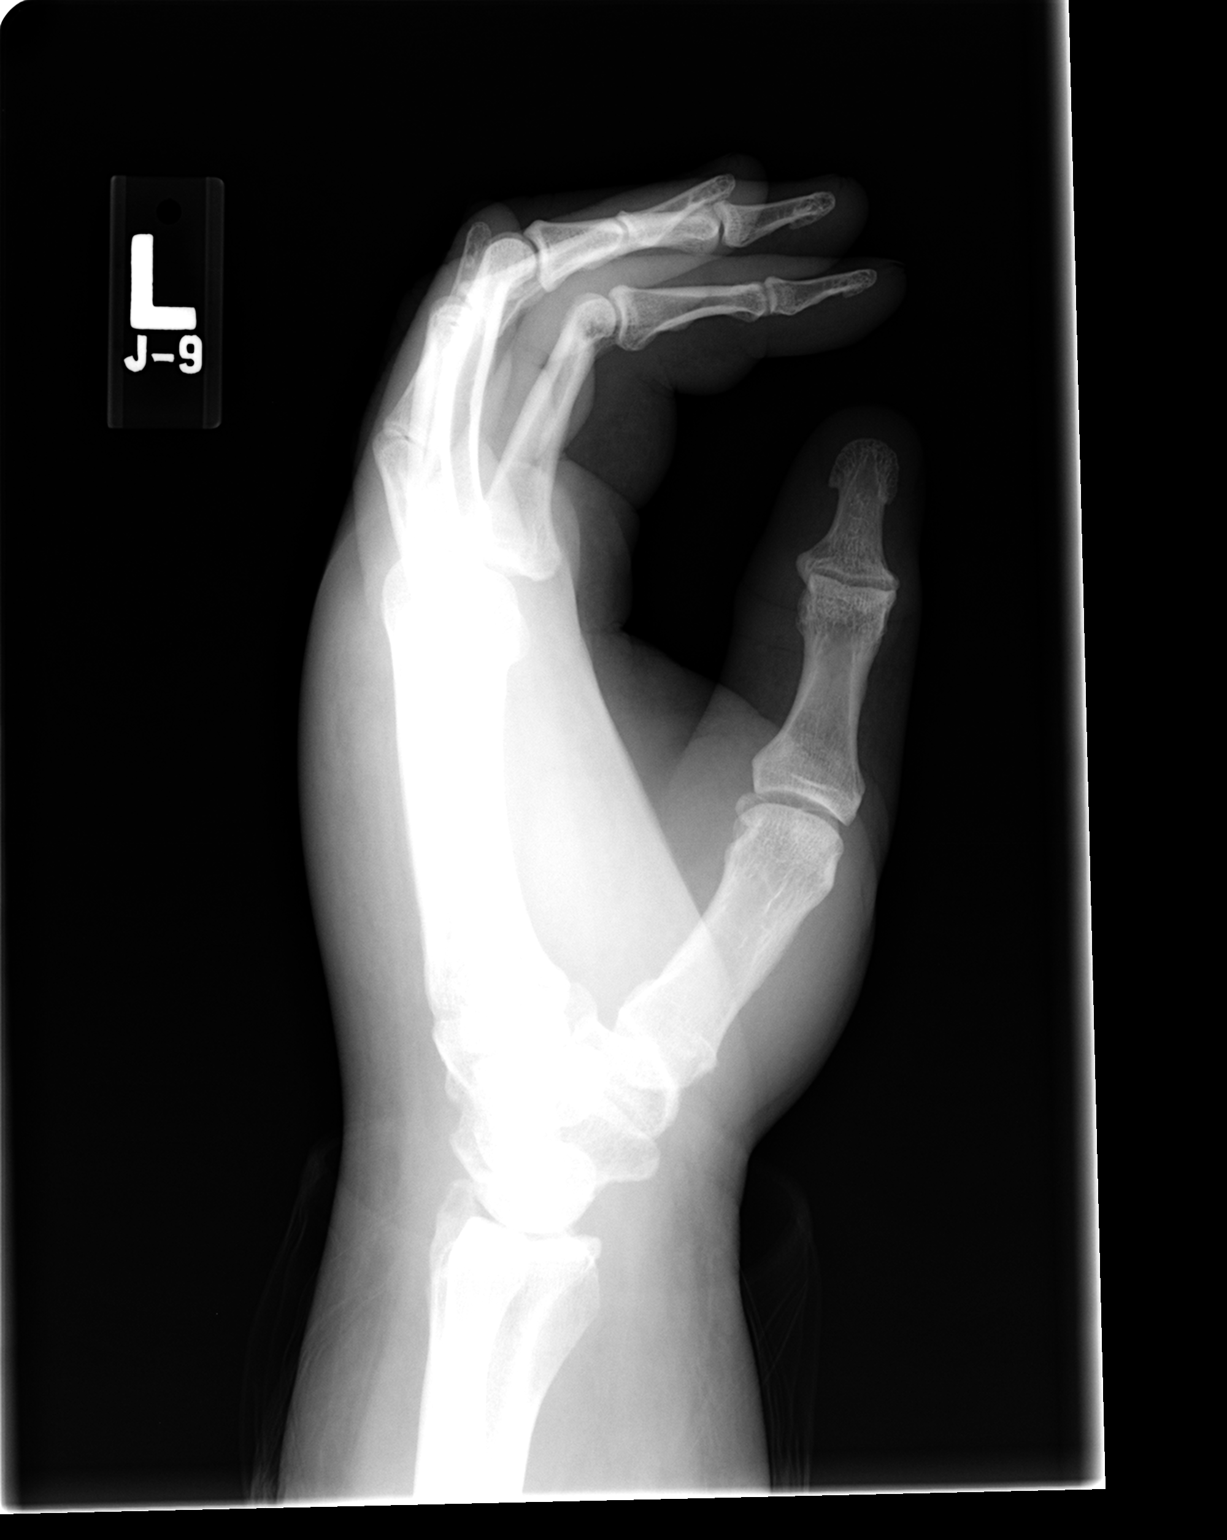

[3 of 3 positions shown; findings below may reference images not displayed]

There is some periarticular erosion of the distal second metacarpal and proximal aspect of the proximal second phalanx.  There is some cartilage loss in this joint with secondary degenerative change.  Bony mineralization is normal.  No other erosions are seen and there is joint space narrowing or degenerative change elsewhere.
IMPRESSION: Arthropathy of the second metacarpophalangeal joint with erosions and some cartilage loss.  There is diffuse soft tissue swelling.  Given the history of gout this is most likely gouty arthritis.    This does not have the appearance of rheumatoid arthritis and that no other erosions or osteopenia is identified.
LEFT HAND- 3 VIEW:
Normal alignment and no fracture.  Mineralization is normal.  No erosions are seen in the left hand.  Joint spaces are well maintained.
IMPRESSION: No significant abnormality.

## 2006-08-23 IMAGING — CR DG HAND COMPLETE 3+V*R*
3 series · 3 of 3 positions shown · non-contrast
Comparison: wrist x-rays 02/01/04
There is diffuse soft tissue swelling most prominent in the second finger.
COMPARISON: none
There appears to be some diffuse soft tissue swelling.

CLINICAL DATA: Bilateral hand swelling, left knee swelling.  History of gout.
RIGHT HAND- 3 VIEW:

[view not recorded (1 of 3)]
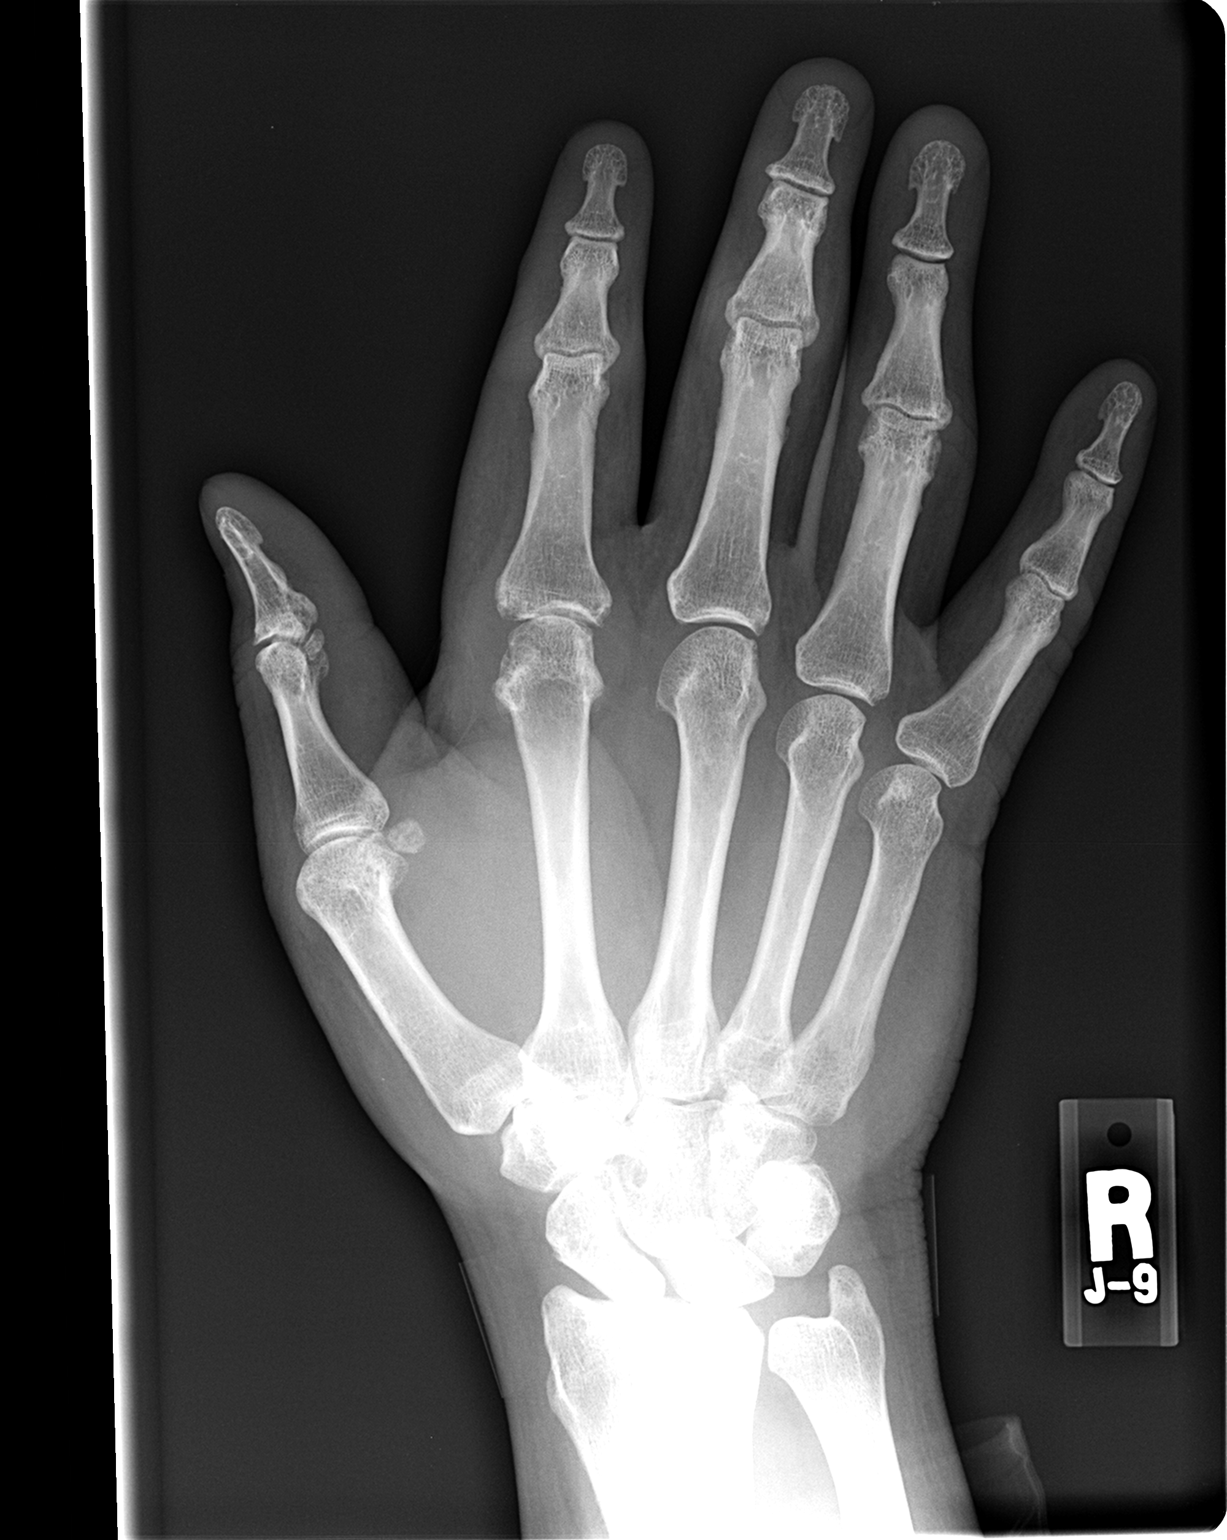

[view not recorded (2 of 3)]
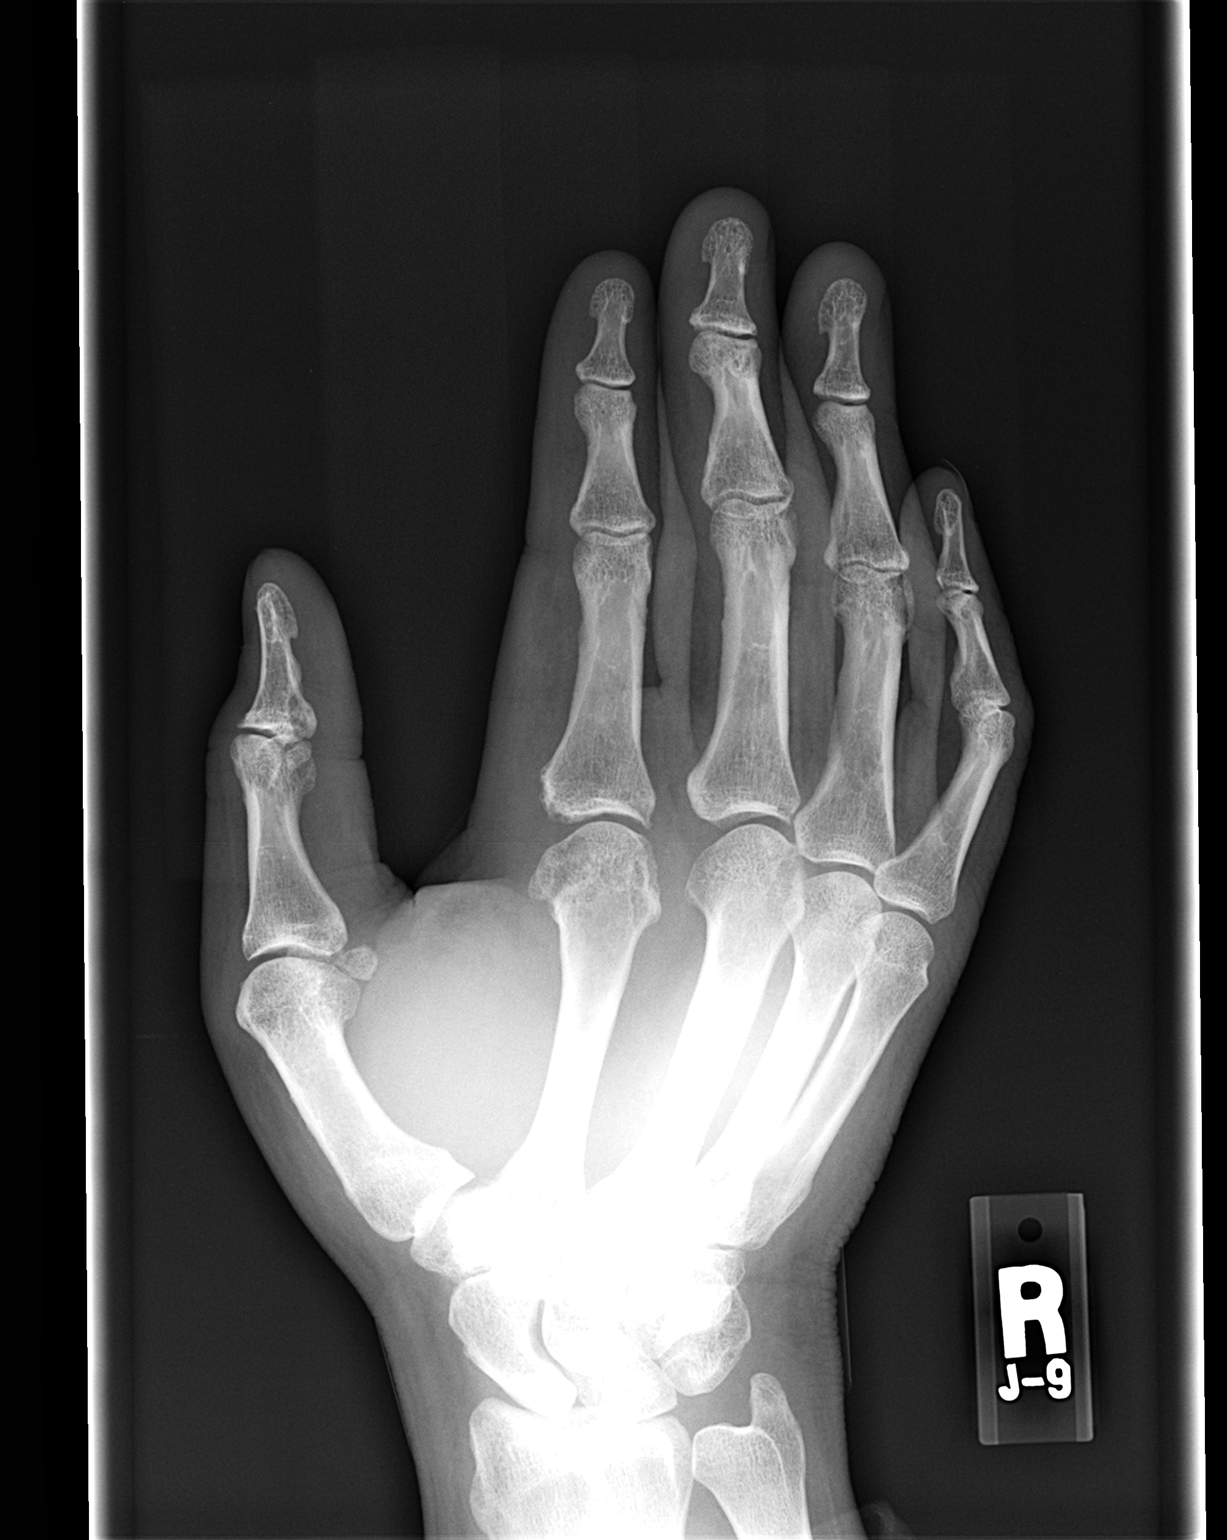

[view not recorded (3 of 3)]
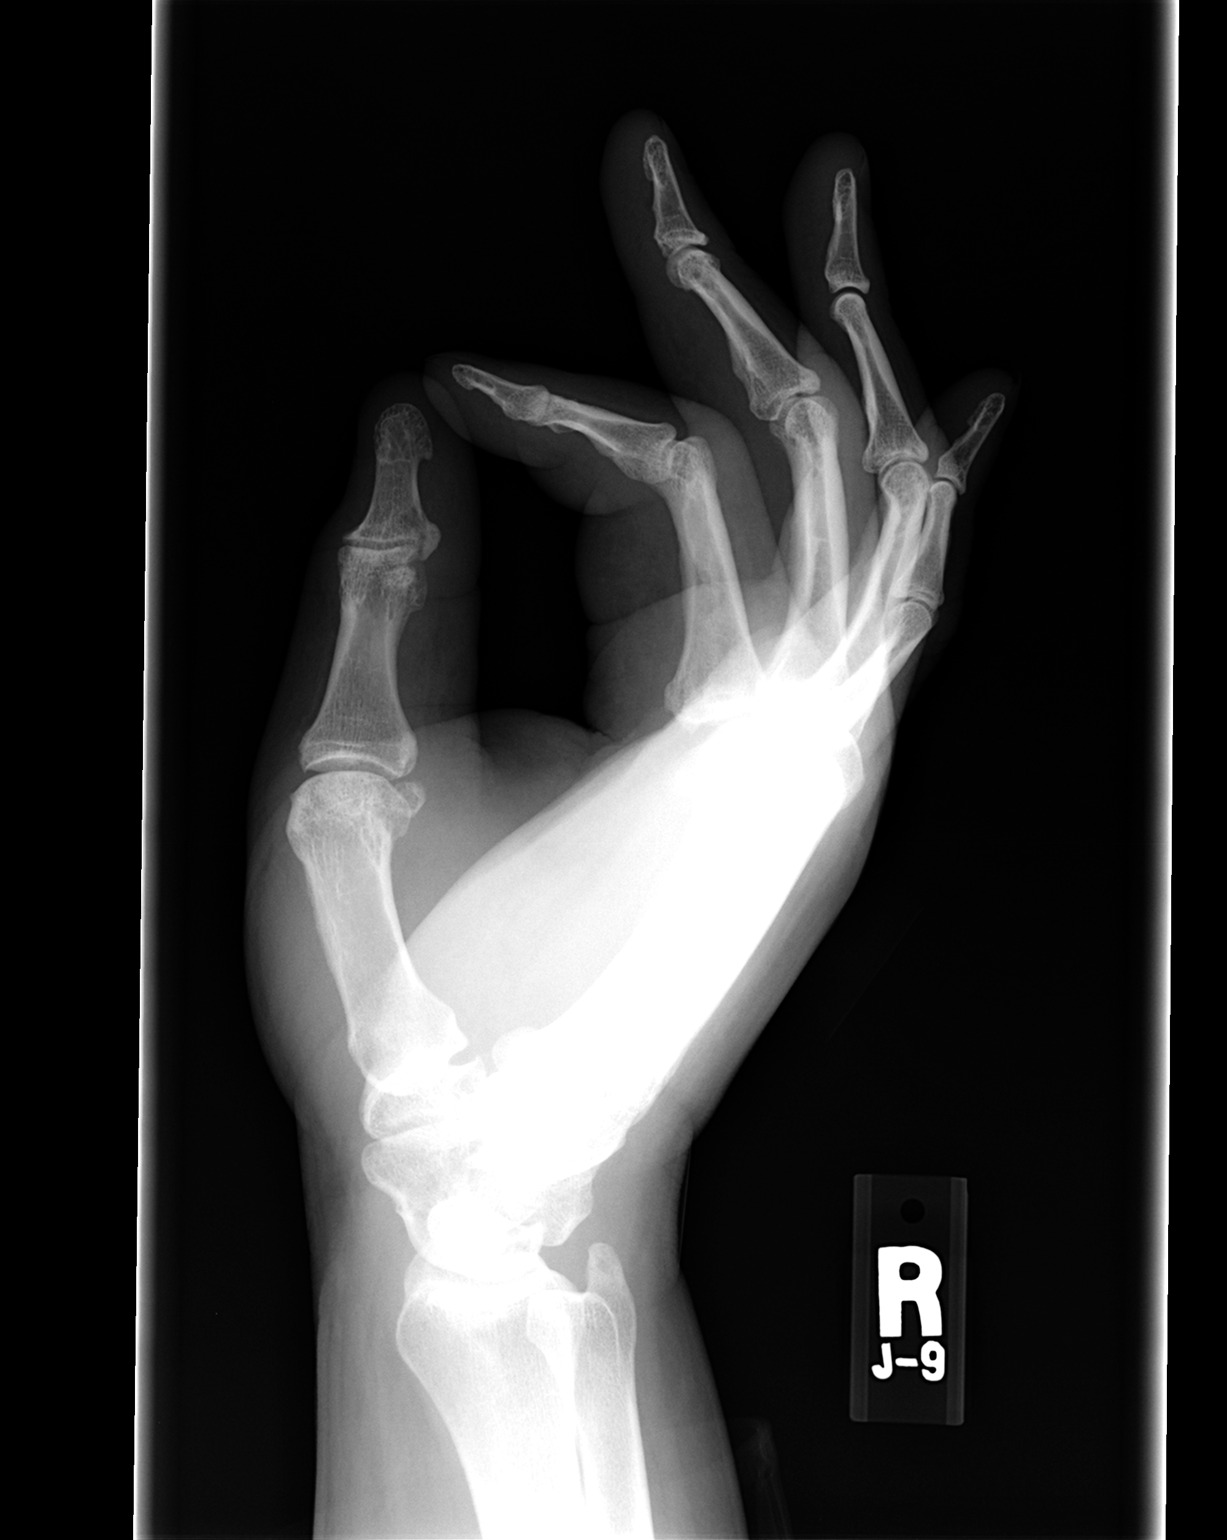

[3 of 3 positions shown; findings below may reference images not displayed]

There is some periarticular erosion of the distal second metacarpal and proximal aspect of the proximal second phalanx.  There is some cartilage loss in this joint with secondary degenerative change.  Bony mineralization is normal.  No other erosions are seen and there is joint space narrowing or degenerative change elsewhere.
IMPRESSION: Arthropathy of the second metacarpophalangeal joint with erosions and some cartilage loss.  There is diffuse soft tissue swelling.  Given the history of gout this is most likely gouty arthritis.    This does not have the appearance of rheumatoid arthritis and that no other erosions or osteopenia is identified.
LEFT HAND- 3 VIEW:
Normal alignment and no fracture.  Mineralization is normal.  No erosions are seen in the left hand.  Joint spaces are well maintained.
IMPRESSION: No significant abnormality.

## 2006-08-31 ENCOUNTER — Emergency Department (HOSPITAL_COMMUNITY): Admission: EM | Admit: 2006-08-31 | Discharge: 2006-08-31 | Payer: Self-pay | Admitting: Emergency Medicine

## 2006-09-02 ENCOUNTER — Emergency Department (HOSPITAL_COMMUNITY): Admission: EM | Admit: 2006-09-02 | Discharge: 2006-09-02 | Payer: Self-pay | Admitting: Emergency Medicine

## 2006-09-19 ENCOUNTER — Emergency Department (HOSPITAL_COMMUNITY): Admission: EM | Admit: 2006-09-19 | Discharge: 2006-09-19 | Payer: Self-pay | Admitting: Emergency Medicine

## 2006-09-21 ENCOUNTER — Emergency Department (HOSPITAL_COMMUNITY): Admission: EM | Admit: 2006-09-21 | Discharge: 2006-09-21 | Payer: Self-pay | Admitting: Emergency Medicine

## 2006-09-30 ENCOUNTER — Emergency Department (HOSPITAL_COMMUNITY): Admission: EM | Admit: 2006-09-30 | Discharge: 2006-09-30 | Payer: Self-pay | Admitting: Emergency Medicine

## 2006-10-10 ENCOUNTER — Emergency Department (HOSPITAL_COMMUNITY): Admission: EM | Admit: 2006-10-10 | Discharge: 2006-10-10 | Payer: Self-pay | Admitting: Emergency Medicine

## 2006-10-13 ENCOUNTER — Emergency Department (HOSPITAL_COMMUNITY): Admission: EM | Admit: 2006-10-13 | Discharge: 2006-10-13 | Payer: Self-pay | Admitting: Emergency Medicine

## 2006-10-14 ENCOUNTER — Emergency Department (HOSPITAL_COMMUNITY): Admission: EM | Admit: 2006-10-14 | Discharge: 2006-10-14 | Payer: Self-pay | Admitting: Emergency Medicine

## 2006-11-01 ENCOUNTER — Emergency Department (HOSPITAL_COMMUNITY): Admission: EM | Admit: 2006-11-01 | Discharge: 2006-11-01 | Payer: Self-pay | Admitting: Emergency Medicine

## 2006-11-04 ENCOUNTER — Emergency Department (HOSPITAL_COMMUNITY): Admission: EM | Admit: 2006-11-04 | Discharge: 2006-11-04 | Payer: Self-pay | Admitting: Emergency Medicine

## 2007-01-16 ENCOUNTER — Emergency Department (HOSPITAL_COMMUNITY): Admission: EM | Admit: 2007-01-16 | Discharge: 2007-01-16 | Payer: Self-pay | Admitting: Emergency Medicine

## 2007-02-02 ENCOUNTER — Emergency Department (HOSPITAL_COMMUNITY): Admission: EM | Admit: 2007-02-02 | Discharge: 2007-02-02 | Payer: Self-pay | Admitting: Emergency Medicine

## 2007-02-04 ENCOUNTER — Emergency Department (HOSPITAL_COMMUNITY): Admission: EM | Admit: 2007-02-04 | Discharge: 2007-02-04 | Payer: Self-pay | Admitting: Emergency Medicine

## 2007-02-22 ENCOUNTER — Emergency Department (HOSPITAL_COMMUNITY): Admission: EM | Admit: 2007-02-22 | Discharge: 2007-02-22 | Payer: Self-pay | Admitting: Emergency Medicine

## 2007-03-06 ENCOUNTER — Emergency Department (HOSPITAL_COMMUNITY): Admission: EM | Admit: 2007-03-06 | Discharge: 2007-03-06 | Payer: Self-pay | Admitting: Emergency Medicine

## 2007-03-11 IMAGING — CR DG KNEE 1-2V*R*
4 series · 4 of 4 positions shown · non-contrast
Comparison: none

CLINICAL DATA: Swollen knee.
 RIGHT KNEE ? 2 VIEW:

[view not recorded (1 of 4)]
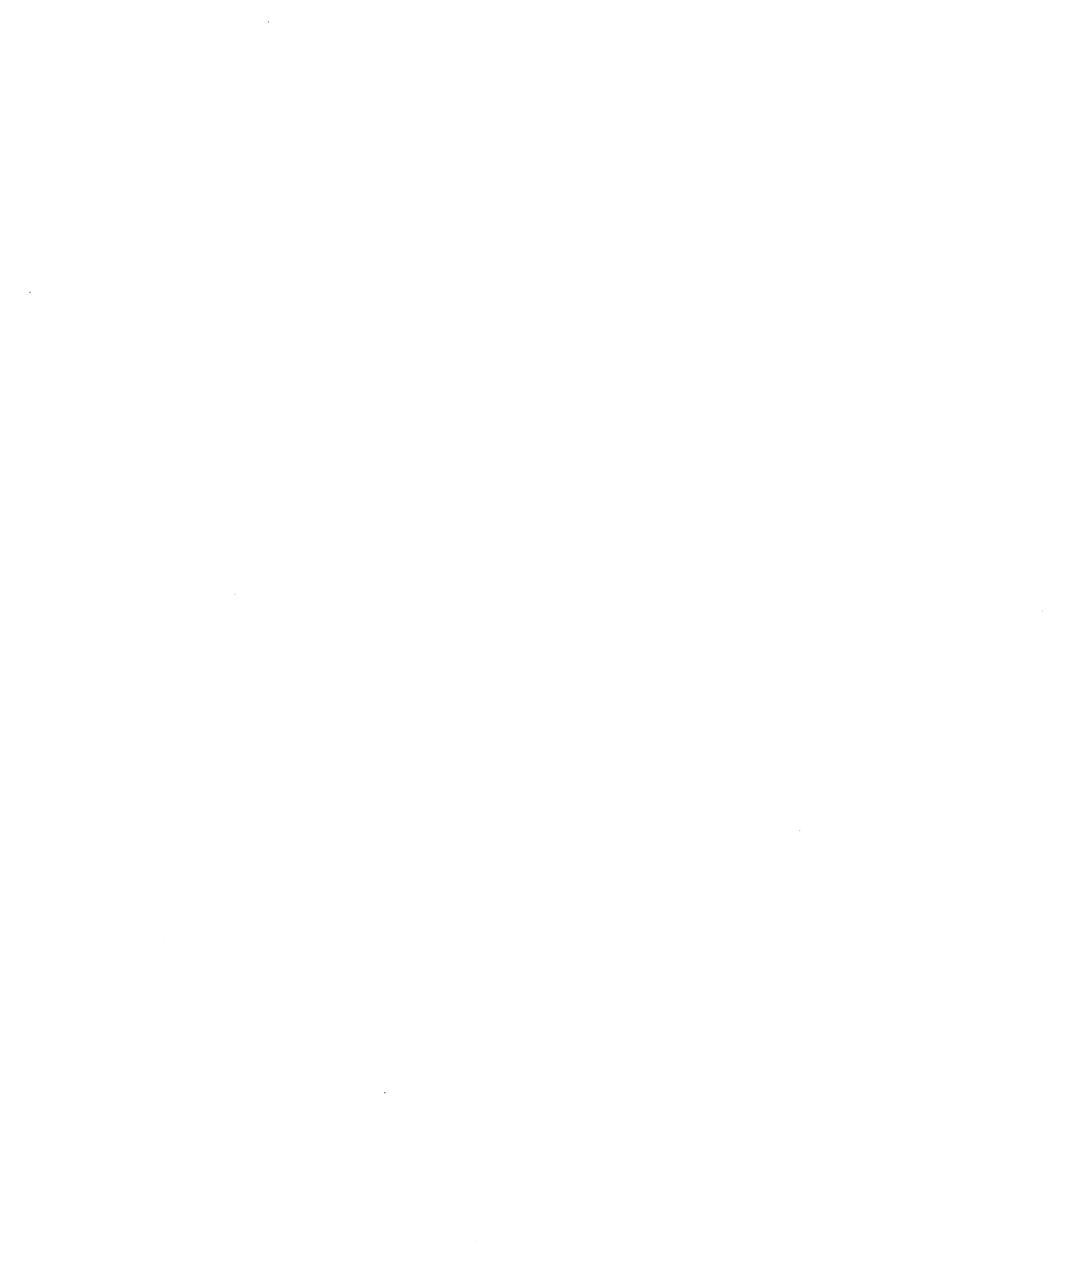

[view not recorded (2 of 4)]
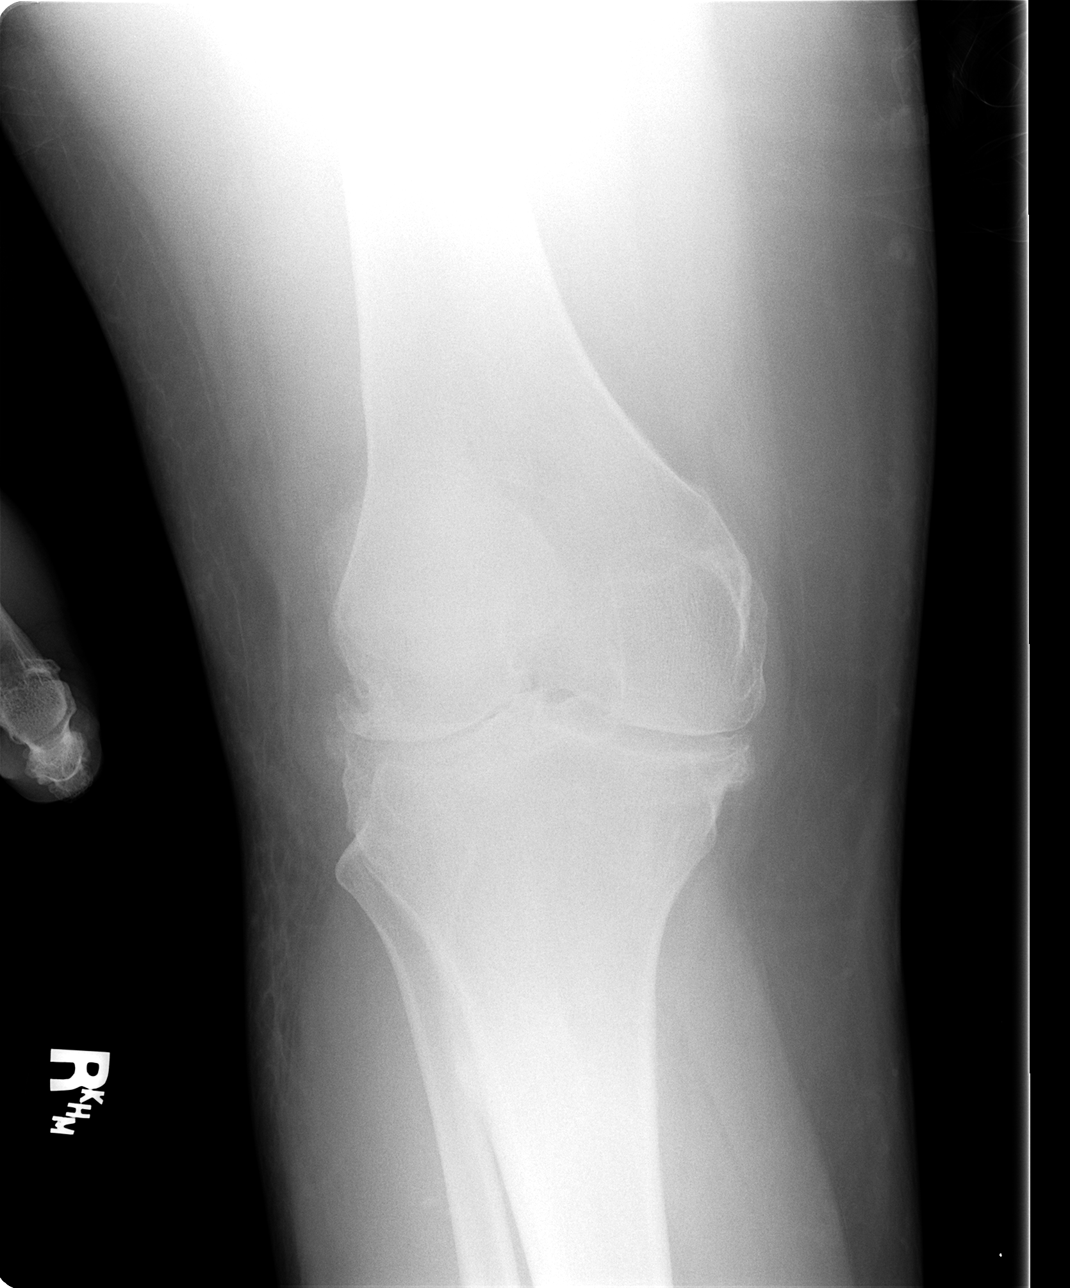

[view not recorded (3 of 4)]
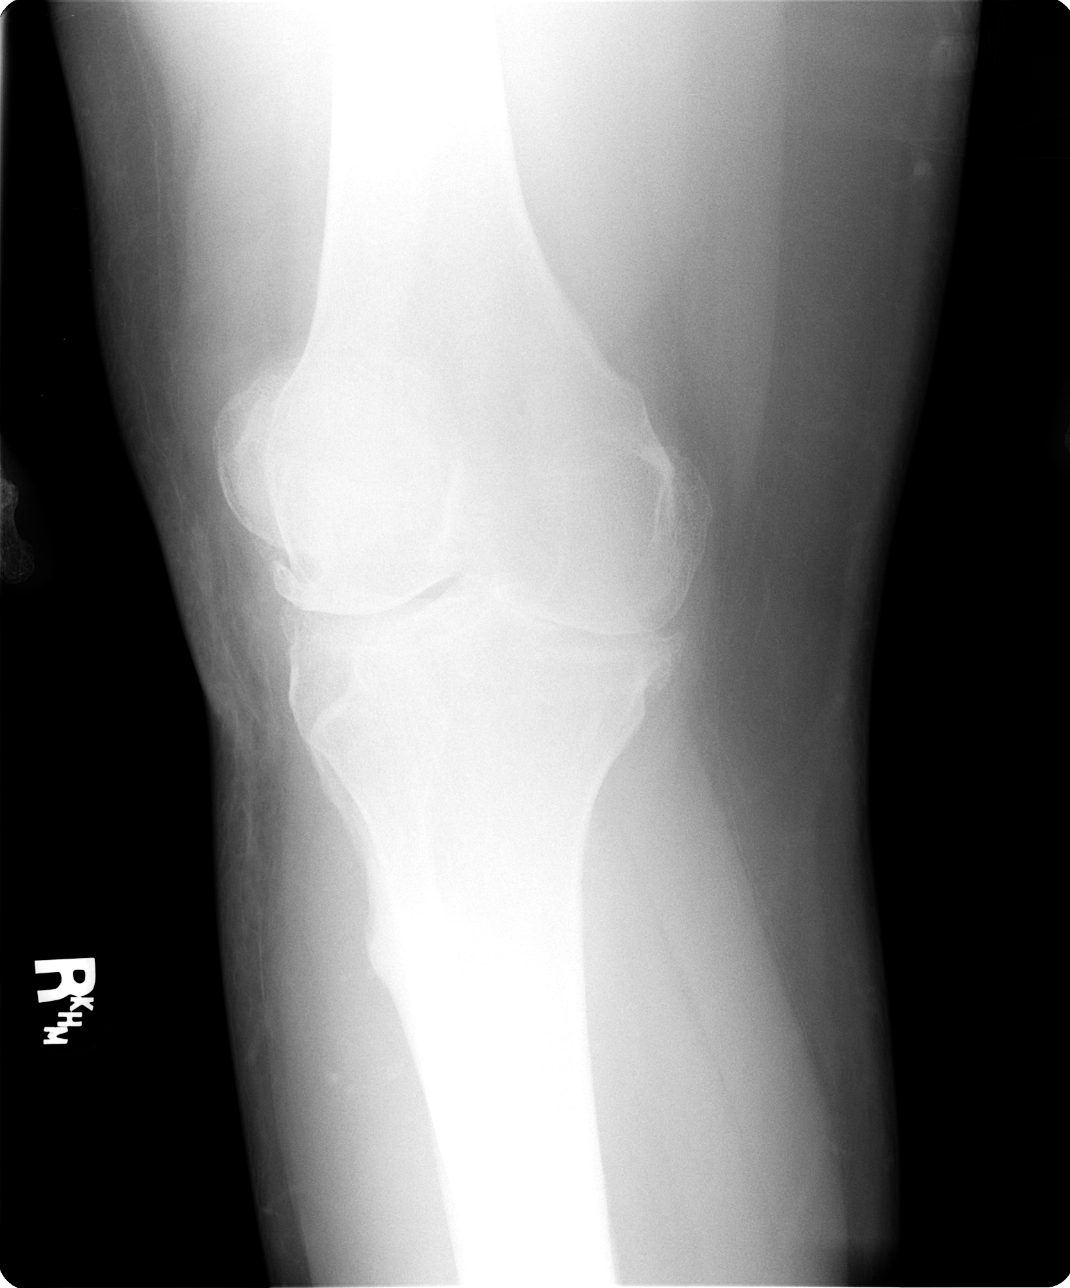

[view not recorded (4 of 4)]
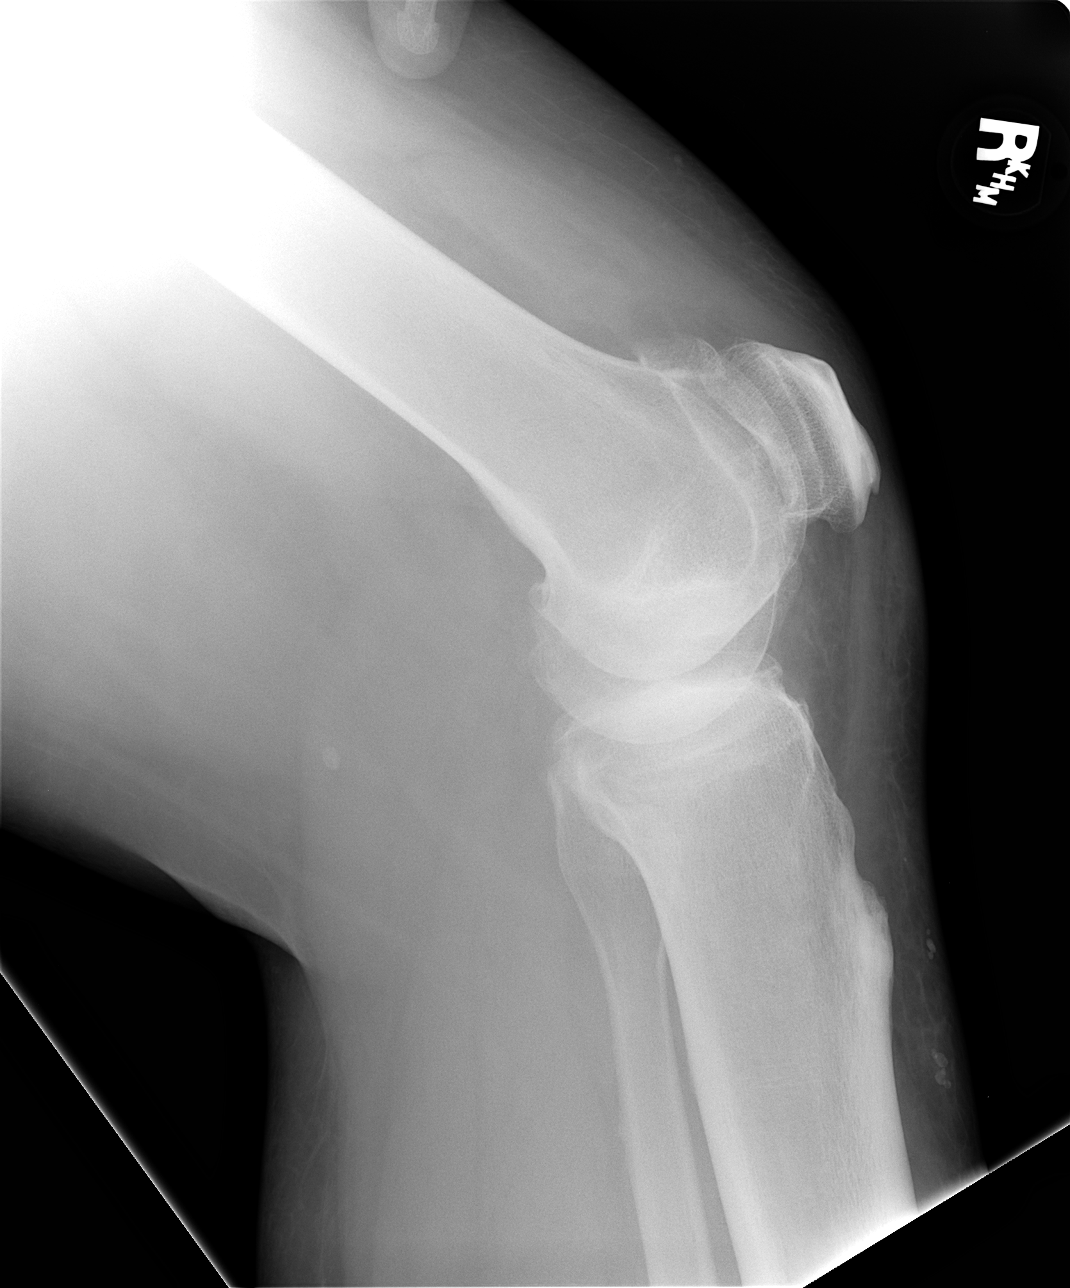

[4 of 4 positions shown; findings below may reference images not displayed]

FINDINGS: There is a suprapatellar joint effusion.  No fractures or dislocations are identified.  
 Tricompartmental osteoarthritis is noted.
IMPRESSION: 1.  Joint effusion.
 2.  Marked tricompartmental osteoarthritis.

## 2007-05-03 ENCOUNTER — Emergency Department (HOSPITAL_COMMUNITY): Admission: EM | Admit: 2007-05-03 | Discharge: 2007-05-03 | Payer: Self-pay | Admitting: Emergency Medicine

## 2007-05-21 ENCOUNTER — Emergency Department (HOSPITAL_COMMUNITY): Admission: EM | Admit: 2007-05-21 | Discharge: 2007-05-21 | Payer: Self-pay | Admitting: Emergency Medicine

## 2007-05-23 ENCOUNTER — Emergency Department (HOSPITAL_COMMUNITY): Admission: EM | Admit: 2007-05-23 | Discharge: 2007-05-23 | Payer: Self-pay | Admitting: Emergency Medicine

## 2007-06-07 IMAGING — US US RENAL
1 series · 14 of 24 positions shown · non-contrast
Comparison: None.

CLINICAL DATA: 40 year old; renal insufficiency, proteinuria.
 RENAL/URINARY TRACT ULTRASOUND ? 12/04/05:
TECHNIQUE: Complete ultrasound examination of the urinary tract was performed including evaluation of the kidneys, renal collecting systems, and urinary bladder.

[Series 1: unknown · 0.26mm/px · 14 of 24 slices shown]
[im 1/24]
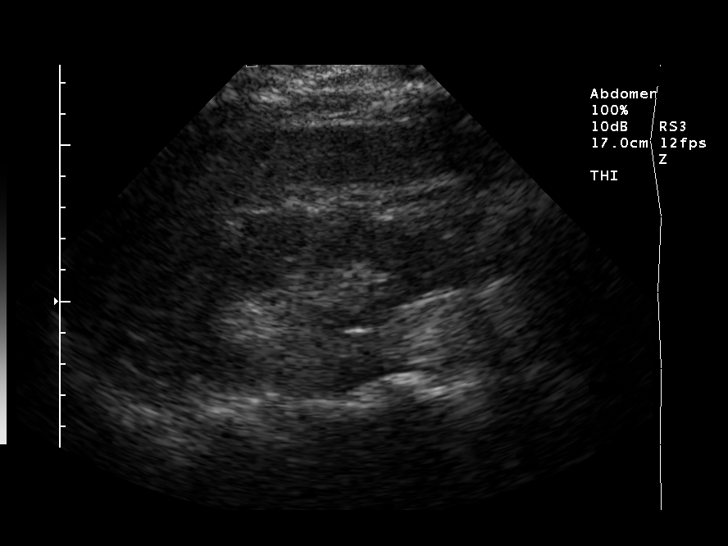
[im 3/24]
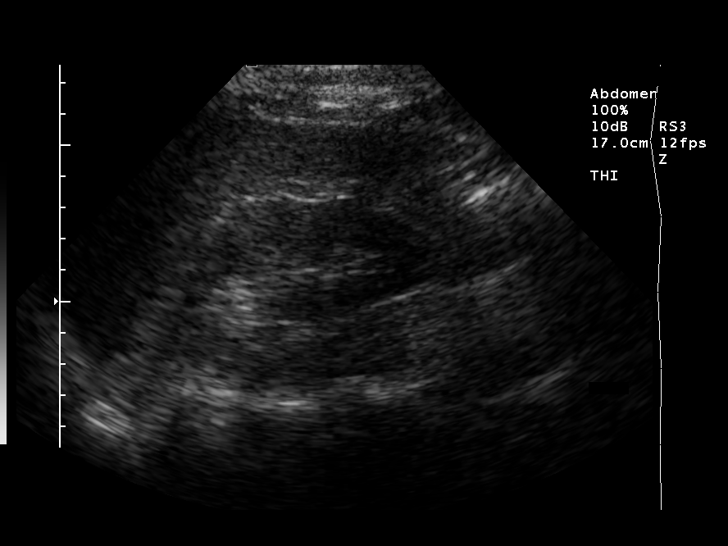
[im 5/24]
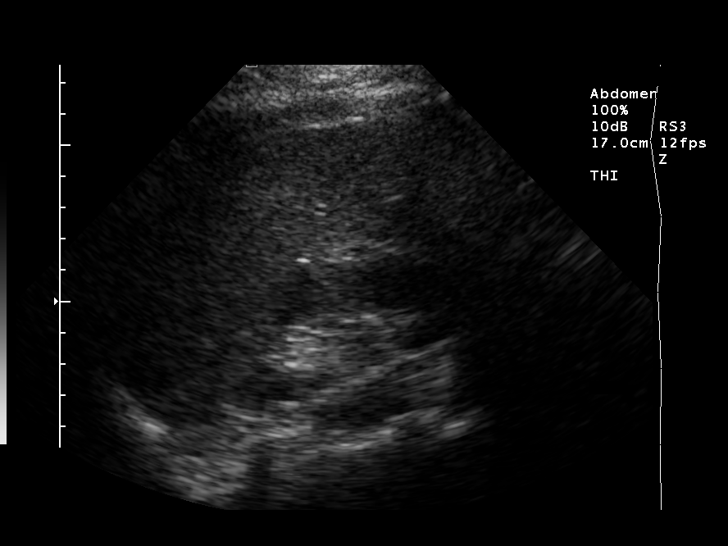
[im 7/24]
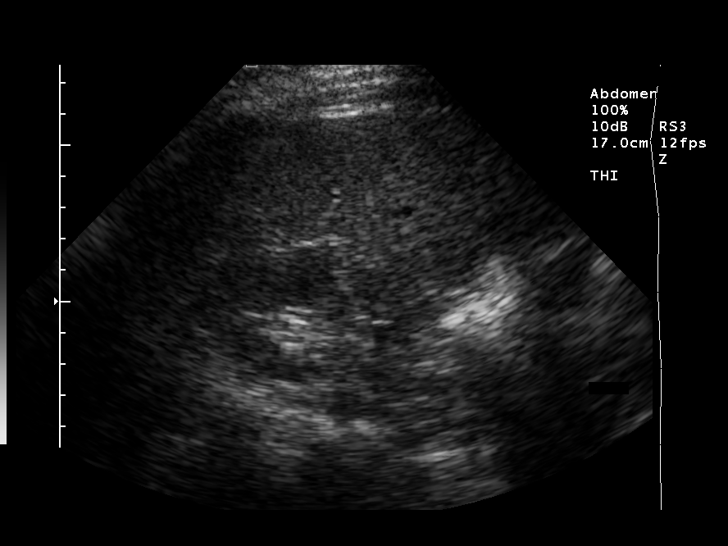
[im 8/24]
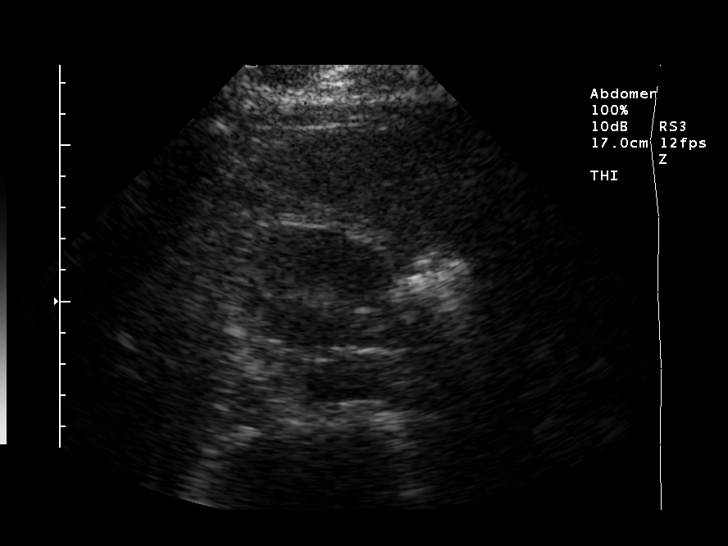
[im 10/24]
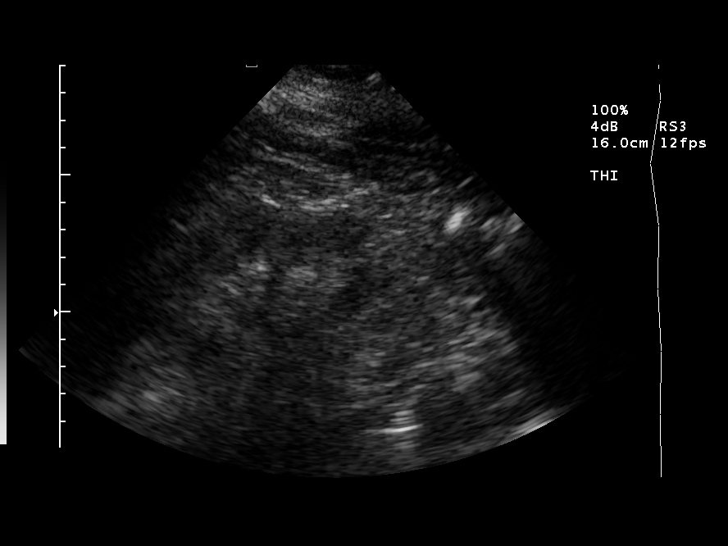
[im 12/24]
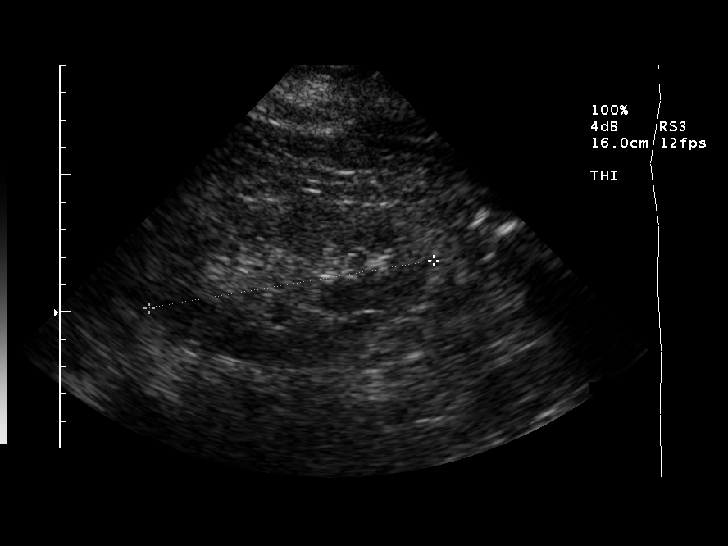
[im 13/24]
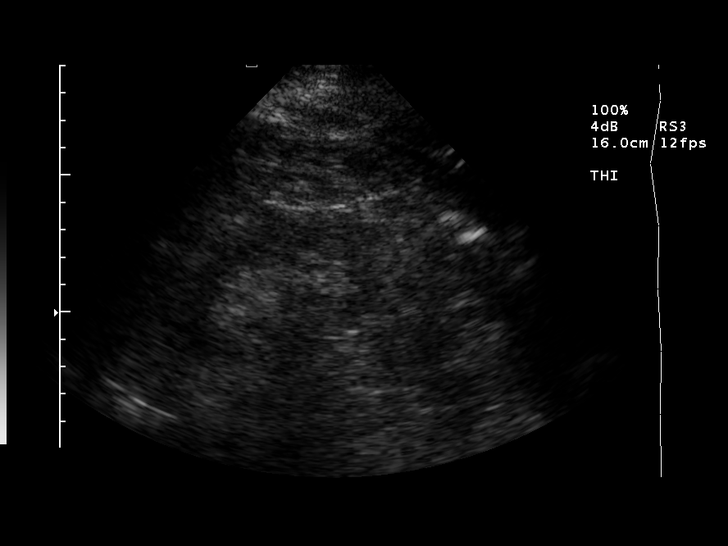
[im 15/24]
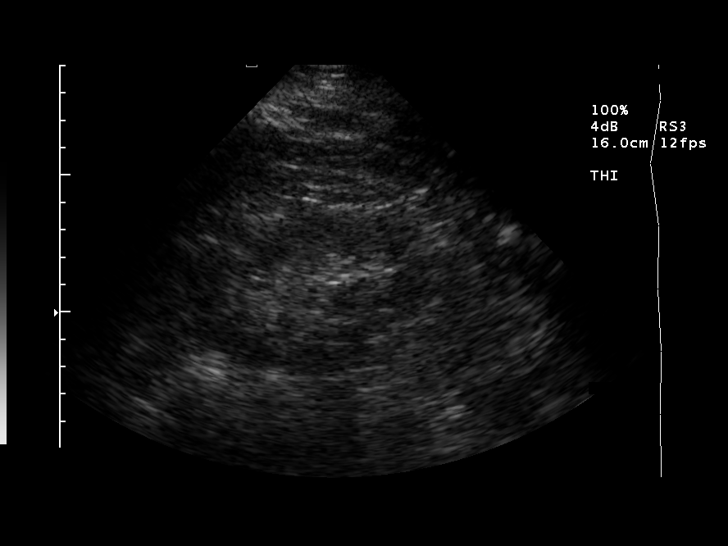
[im 17/24]
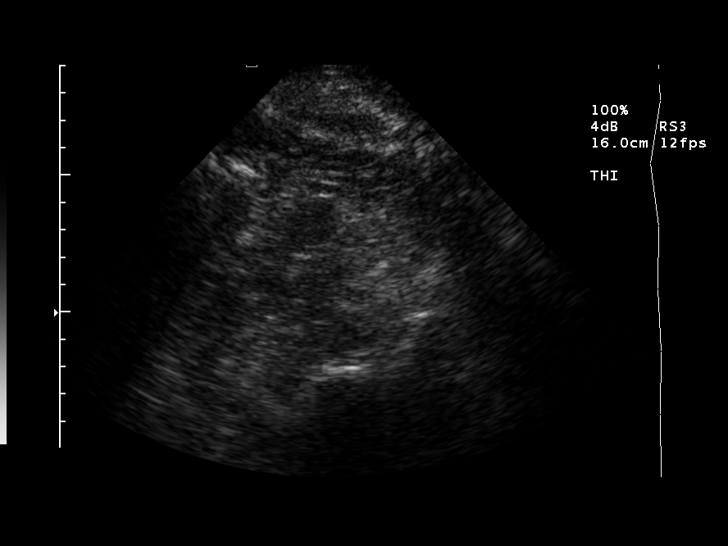
[im 19/24]
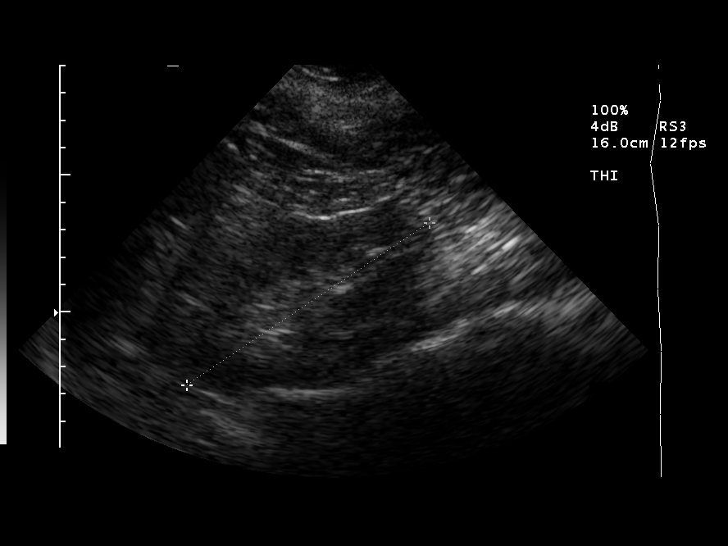
[im 20/24]
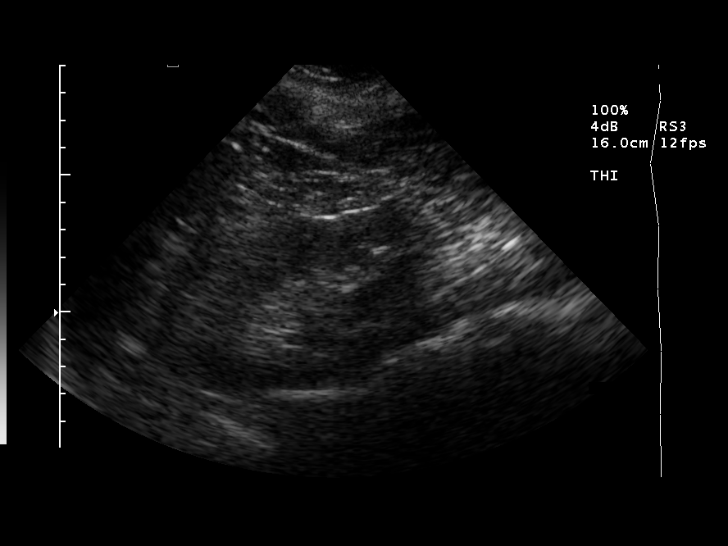
[im 22/24]
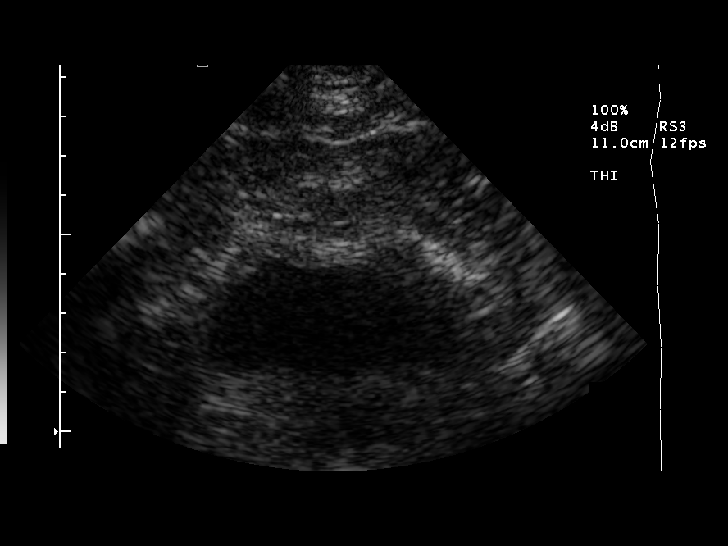
[im 24/24]
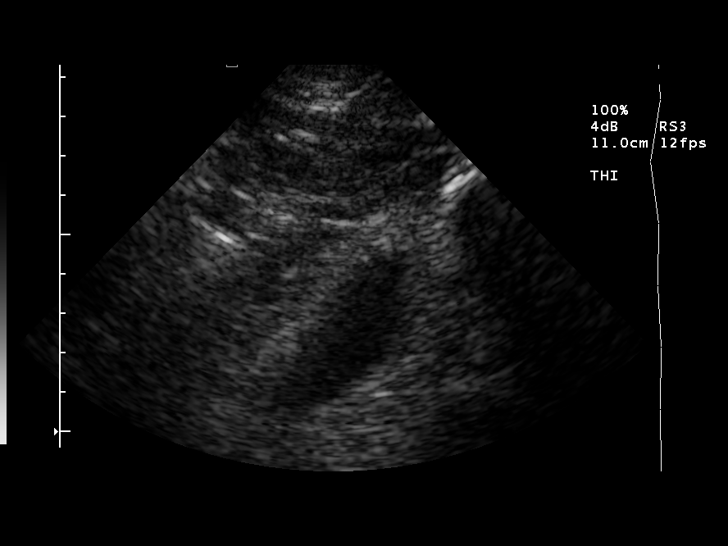

[14 of 24 positions shown; findings below may reference images not displayed]

FINDINGS: The right kidney measures 10.0 cm and the left kidney measures 10.6 cm.  Both kidneys demonstrate normal echogenicity without focal lesions or hydronephrosis.  No renal calculi or perinephric fluid collections.  The bladder is underdistended but demonstrates no gross abnormalities.
IMPRESSION: Unremarkable renal ultrasound examination.

## 2007-07-13 ENCOUNTER — Emergency Department (HOSPITAL_COMMUNITY): Admission: EM | Admit: 2007-07-13 | Discharge: 2007-07-13 | Payer: Self-pay | Admitting: Emergency Medicine

## 2007-08-16 ENCOUNTER — Emergency Department (HOSPITAL_COMMUNITY): Admission: EM | Admit: 2007-08-16 | Discharge: 2007-08-16 | Payer: Self-pay | Admitting: Emergency Medicine

## 2007-09-26 IMAGING — CR DG FOOT COMPLETE 3+V*L*
3 series · 3 of 3 positions shown · non-contrast
Comparison: none

History left foot pain

LEFT FOOT 3 VIEWS:
Advanced degenerative changes first MTP joint with joint space narrowing,
subchondral cyst formation, and spur formation.
Destructive process identified at DIP joint second toe though portions are
potentially corticated.
Fragmentation of middle phalanx second toe noted.
Soft tissue swelling second toe.
Additional subchondral cystic change noted at base of 4th metatarsal.
Spur formation medial malleolus and plantar aspect of calcaneus.
Degenerative changes at multiple intertarsal joints on lateral view.
No acute fracture or dislocation.
Mild soft tissue swelling.

[view not recorded (1 of 3)]
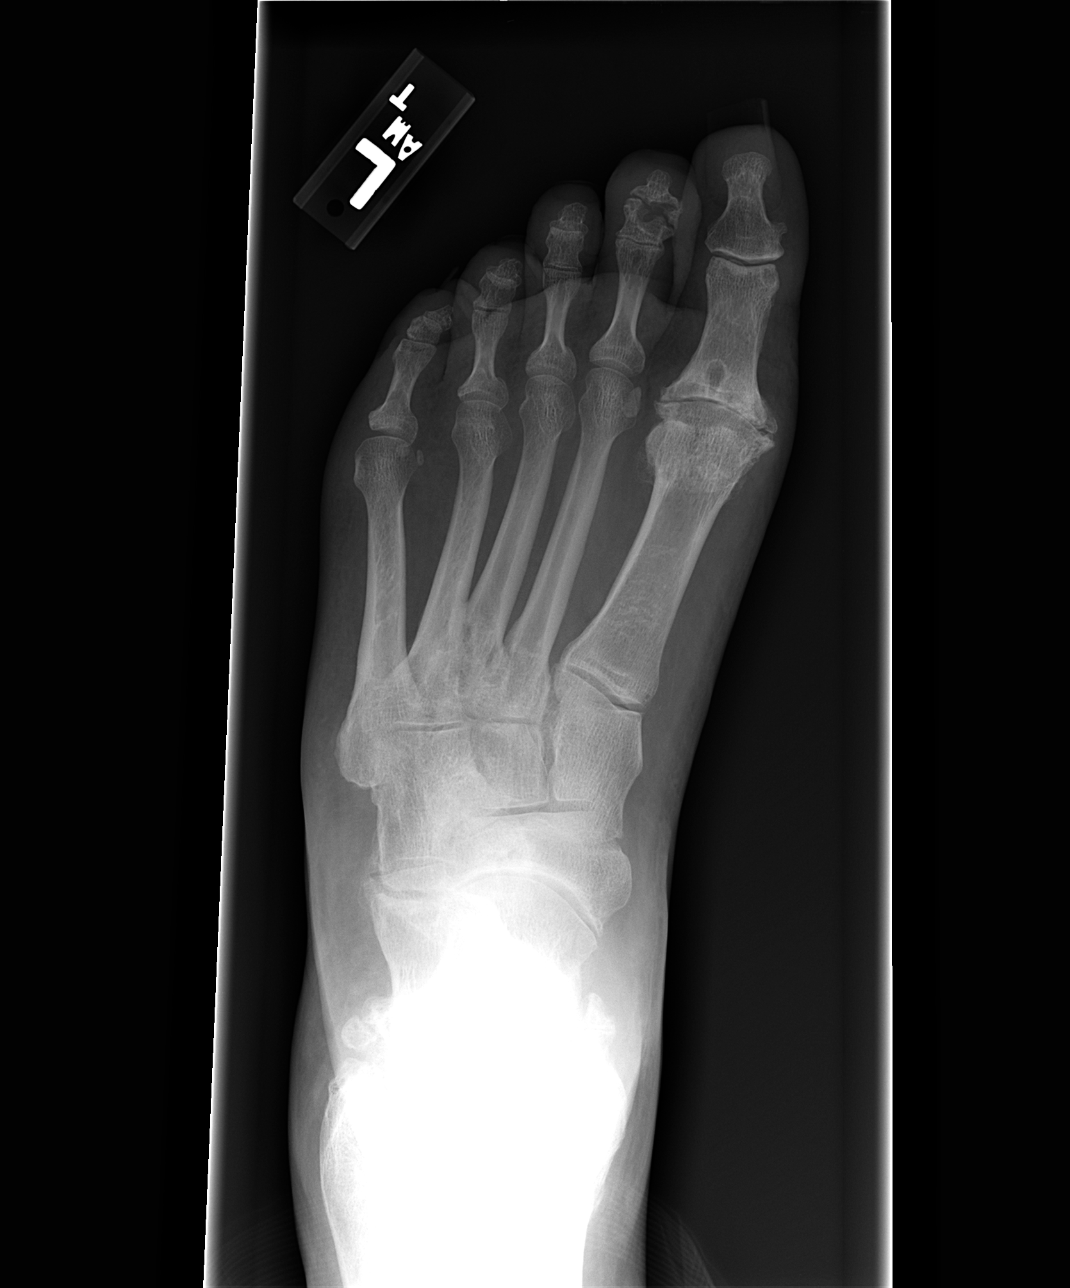

[view not recorded (2 of 3)]
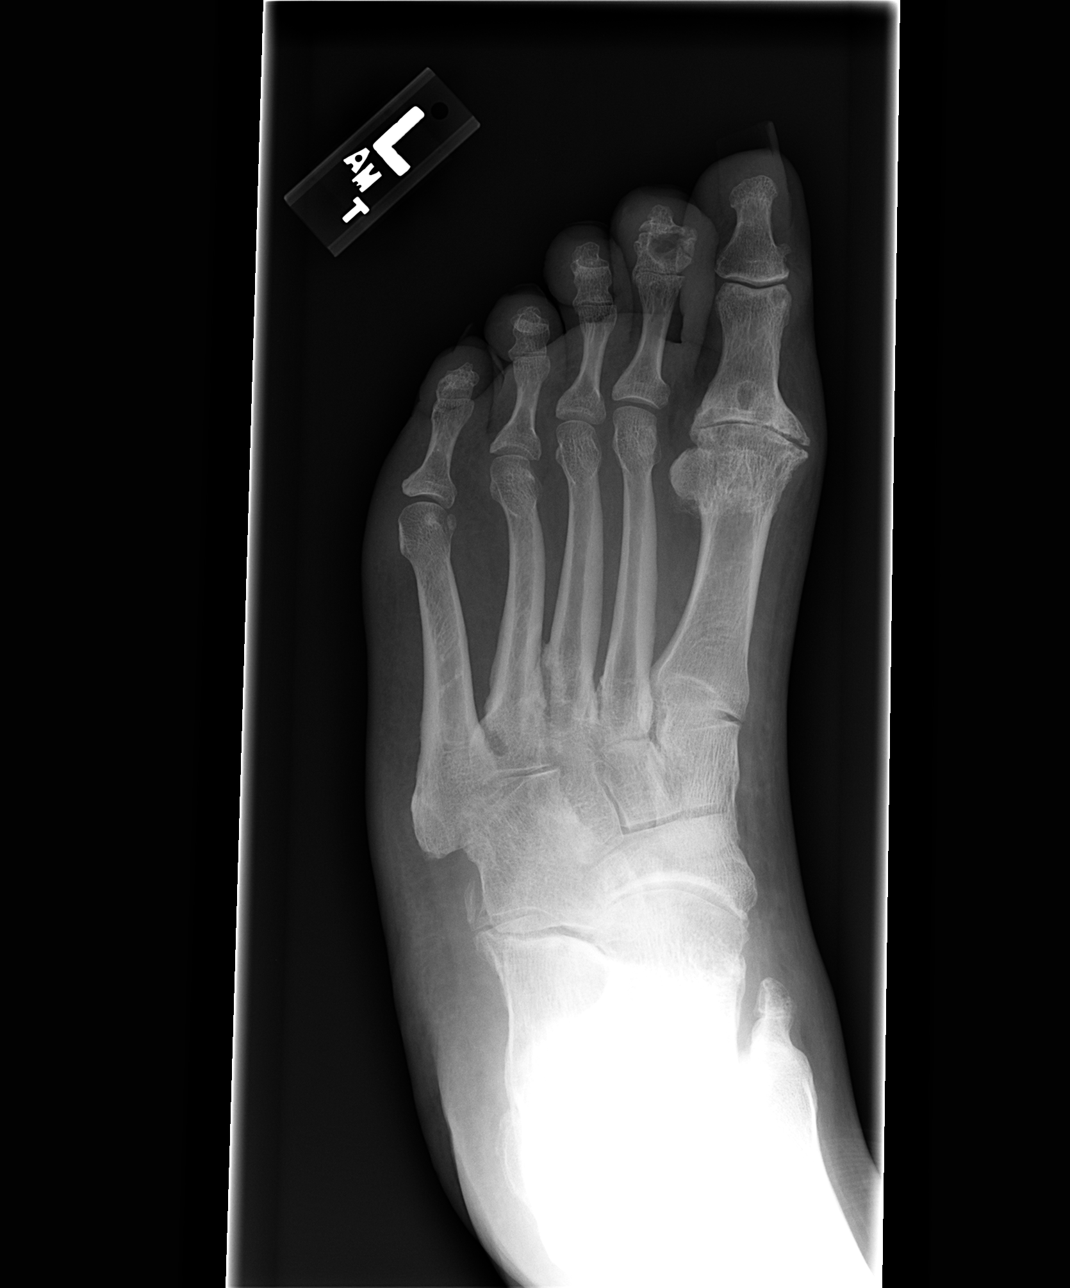

[view not recorded (3 of 3)]
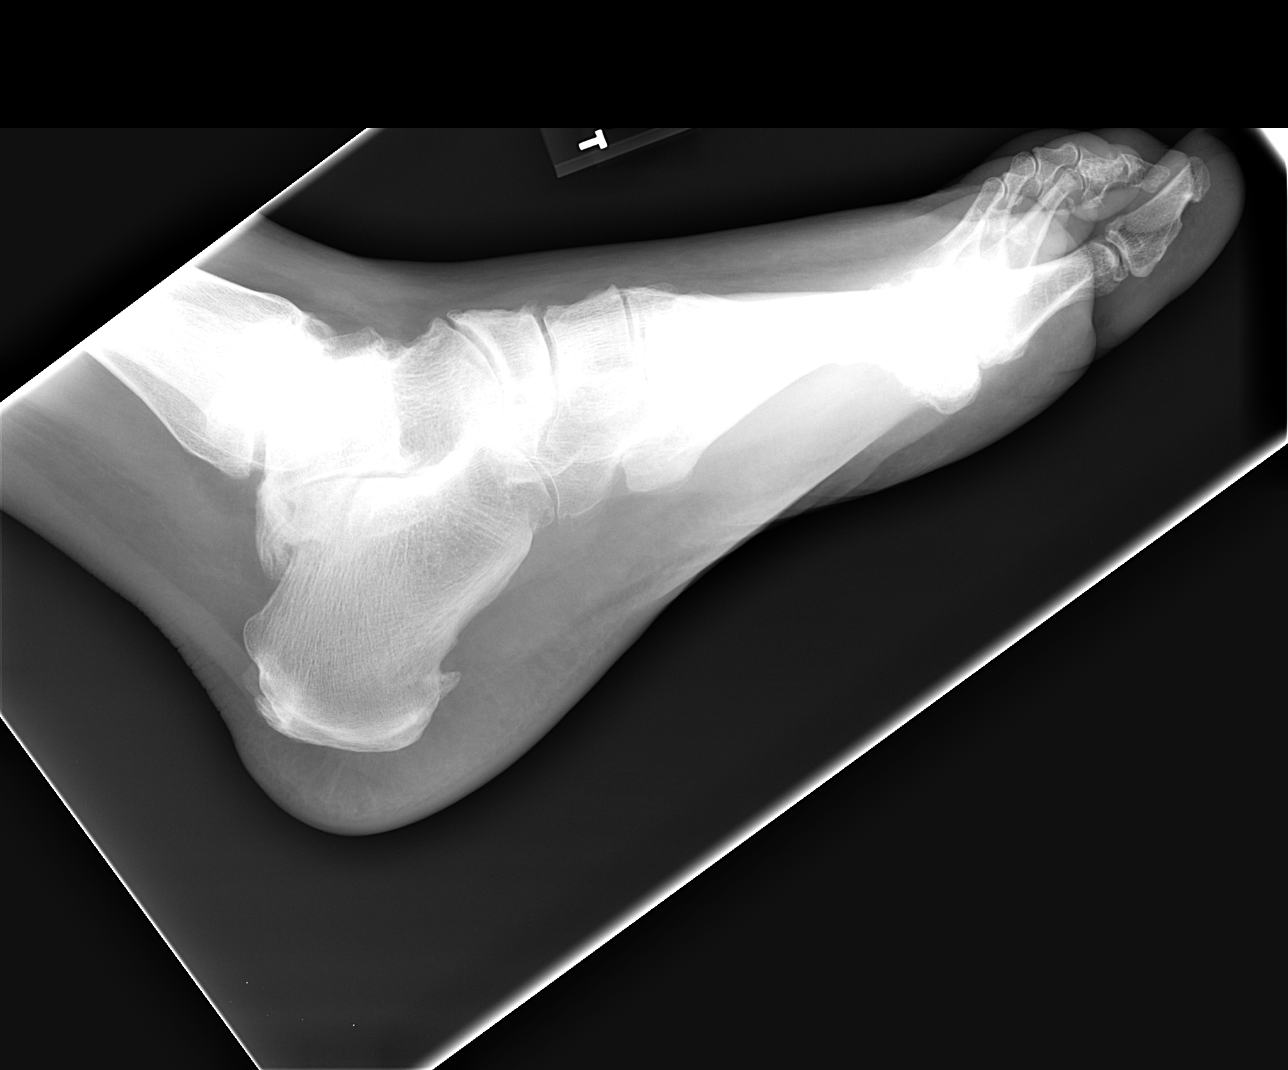

[3 of 3 positions shown; findings below may reference images not displayed]

IMPRESSION: Degenerative changes at first MTP joint and at fourth TMT joint.
Question posttraumatic deformity of second toe though an area bone destruction
is seen, potentially corticated suggesting this may be older in age, but acute
osteomyelitis is not excluded with this appearance.
Scattered degenerative changes of intertarsal joints.

## 2007-10-09 ENCOUNTER — Emergency Department (HOSPITAL_COMMUNITY): Admission: EM | Admit: 2007-10-09 | Discharge: 2007-10-09 | Payer: Self-pay | Admitting: Emergency Medicine

## 2007-10-23 ENCOUNTER — Emergency Department (HOSPITAL_COMMUNITY): Admission: EM | Admit: 2007-10-23 | Discharge: 2007-10-23 | Payer: Self-pay | Admitting: Emergency Medicine

## 2007-11-03 ENCOUNTER — Emergency Department (HOSPITAL_COMMUNITY): Admission: EM | Admit: 2007-11-03 | Discharge: 2007-11-03 | Payer: Self-pay | Admitting: Emergency Medicine

## 2007-11-17 ENCOUNTER — Emergency Department (HOSPITAL_COMMUNITY): Admission: EM | Admit: 2007-11-17 | Discharge: 2007-11-17 | Payer: Self-pay | Admitting: Emergency Medicine

## 2007-11-28 ENCOUNTER — Emergency Department (HOSPITAL_COMMUNITY): Admission: EM | Admit: 2007-11-28 | Discharge: 2007-11-28 | Payer: Self-pay | Admitting: Emergency Medicine

## 2007-12-01 IMAGING — CR DG CHEST 2V
3 series · 3 of 3 positions shown · non-contrast
Comparison: 04/18/06
 The heart size and mediastinal contours are within normal limits.  Both lungs are clear.  The visualized skeletal structures are unremarkable.

CLINICAL DATA: Wheezing and cough.  Low-grade fever.
 [id number] VIEWS:

[view not recorded (1 of 3)]
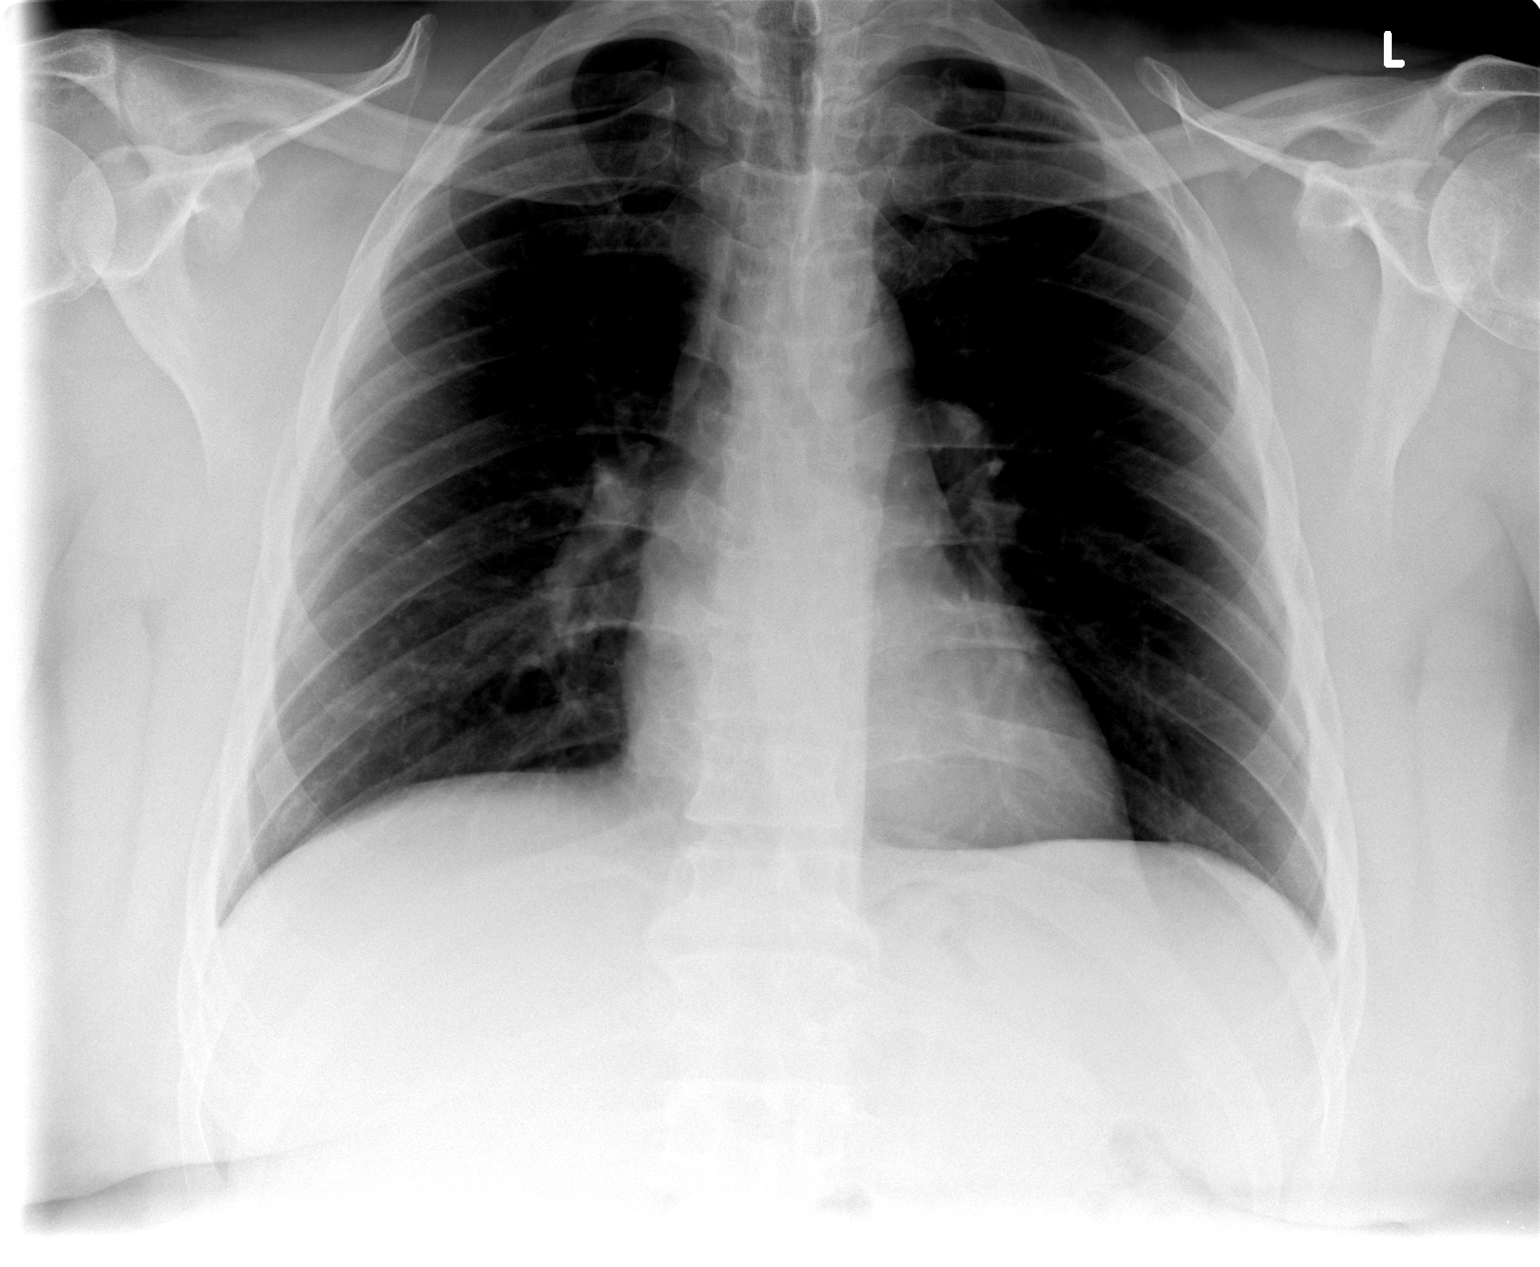

[view not recorded (2 of 3)]
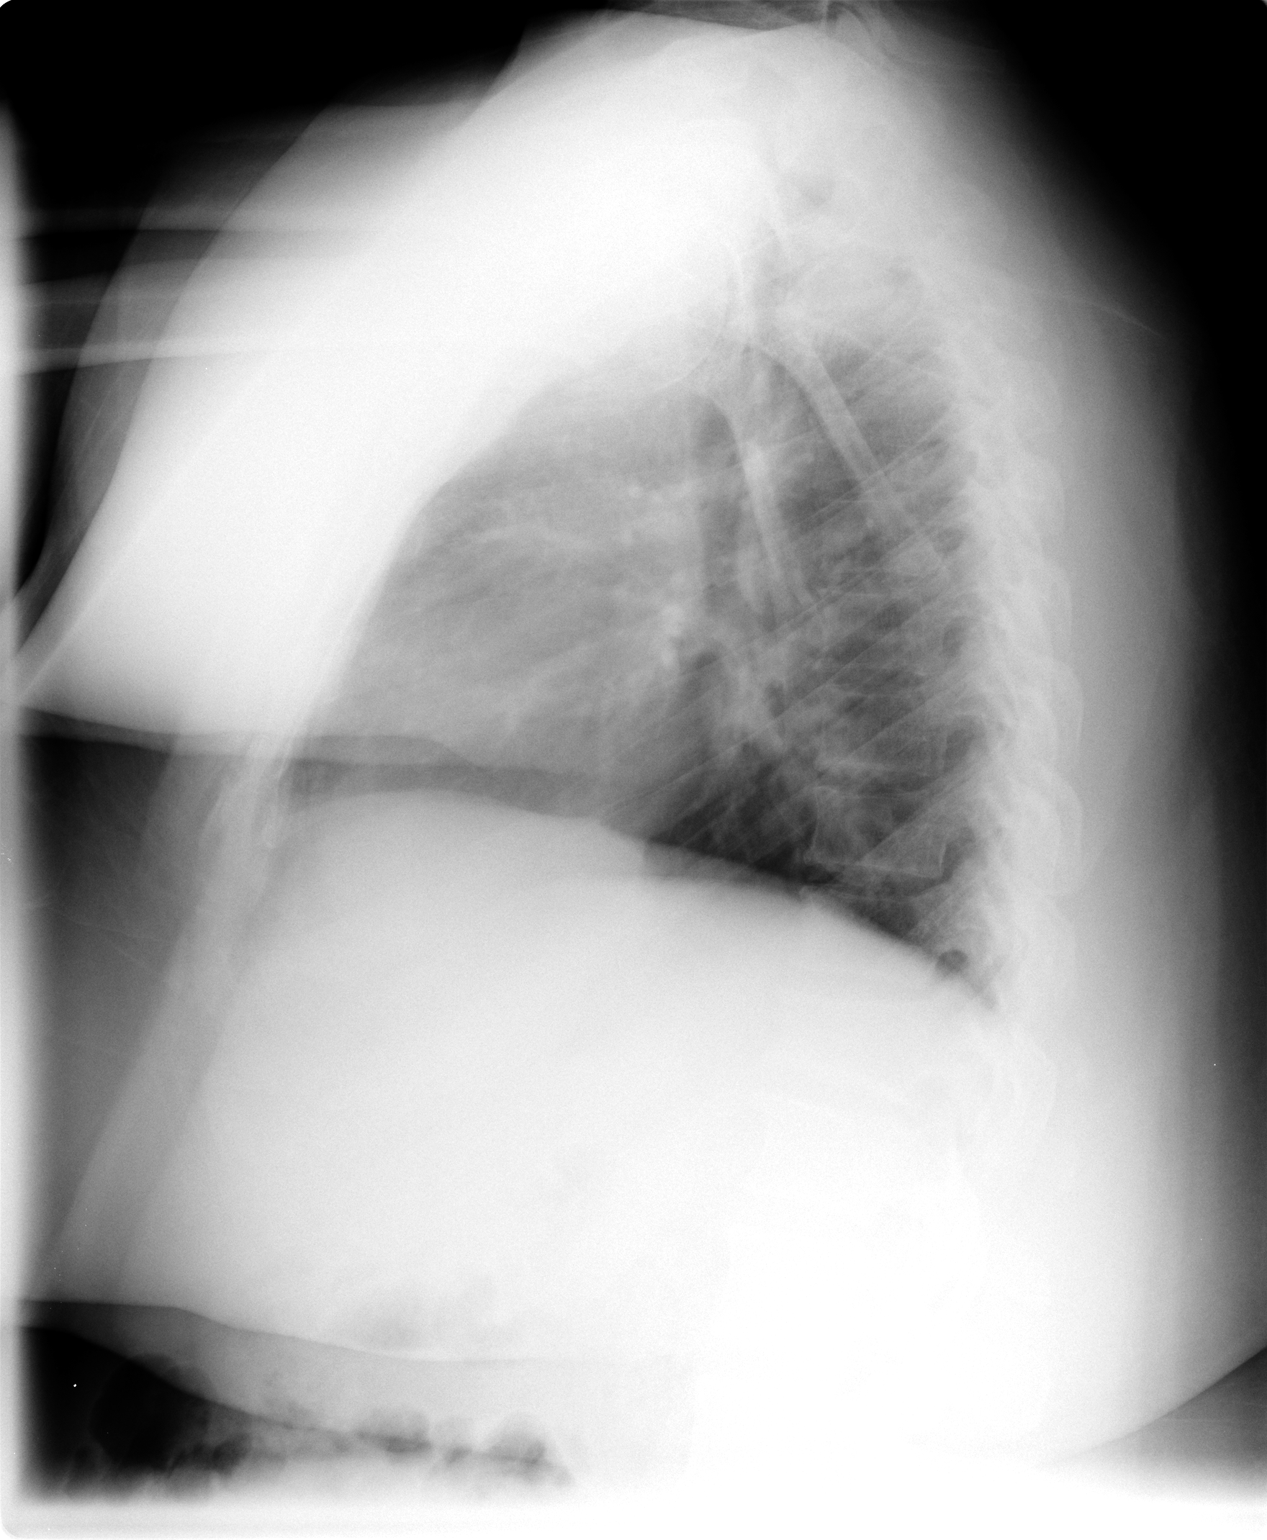

[view not recorded (3 of 3)]
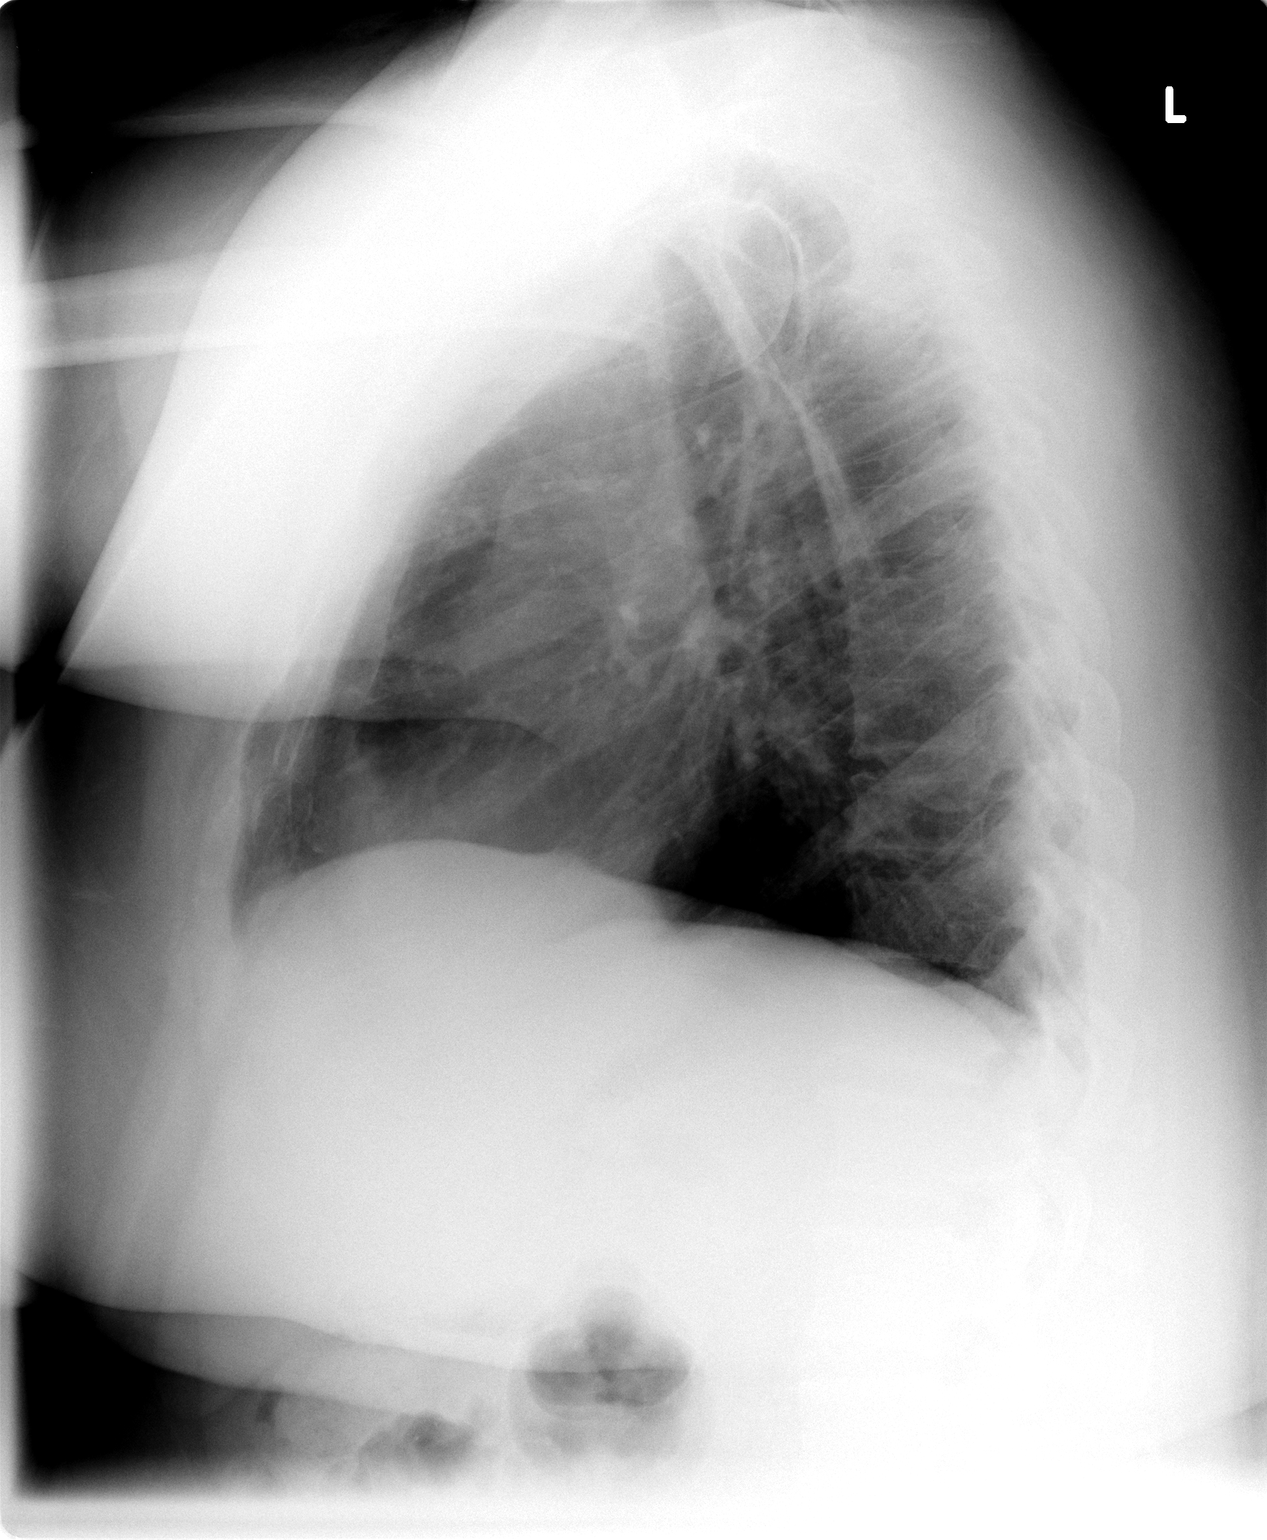

[3 of 3 positions shown; findings below may reference images not displayed]

IMPRESSION: No active cardiopulmonary disease.

## 2007-12-02 ENCOUNTER — Emergency Department (HOSPITAL_COMMUNITY): Admission: EM | Admit: 2007-12-02 | Discharge: 2007-12-02 | Payer: Self-pay | Admitting: Emergency Medicine

## 2007-12-15 ENCOUNTER — Emergency Department (HOSPITAL_COMMUNITY): Admission: EM | Admit: 2007-12-15 | Discharge: 2007-12-15 | Payer: Self-pay | Admitting: Emergency Medicine

## 2007-12-22 ENCOUNTER — Emergency Department (HOSPITAL_COMMUNITY): Admission: EM | Admit: 2007-12-22 | Discharge: 2007-12-22 | Payer: Self-pay | Admitting: Emergency Medicine

## 2007-12-24 ENCOUNTER — Emergency Department (HOSPITAL_COMMUNITY): Admission: EM | Admit: 2007-12-24 | Discharge: 2007-12-24 | Payer: Self-pay | Admitting: Emergency Medicine

## 2008-01-01 ENCOUNTER — Emergency Department (HOSPITAL_COMMUNITY): Admission: EM | Admit: 2008-01-01 | Discharge: 2008-01-01 | Payer: Self-pay | Admitting: Emergency Medicine

## 2008-02-01 ENCOUNTER — Emergency Department (HOSPITAL_COMMUNITY): Admission: EM | Admit: 2008-02-01 | Discharge: 2008-02-01 | Payer: Self-pay | Admitting: Emergency Medicine

## 2008-03-03 IMAGING — CR DG HAND COMPLETE 3+V*L*
3 series · 3 of 3 positions shown · non-contrast
Comparison: 02/19/05.

CLINICAL DATA: Left hand pain ? history of gout.
 LEFT HAND COMPLETE ? 3 VIEWS:

[view not recorded (1 of 3)]
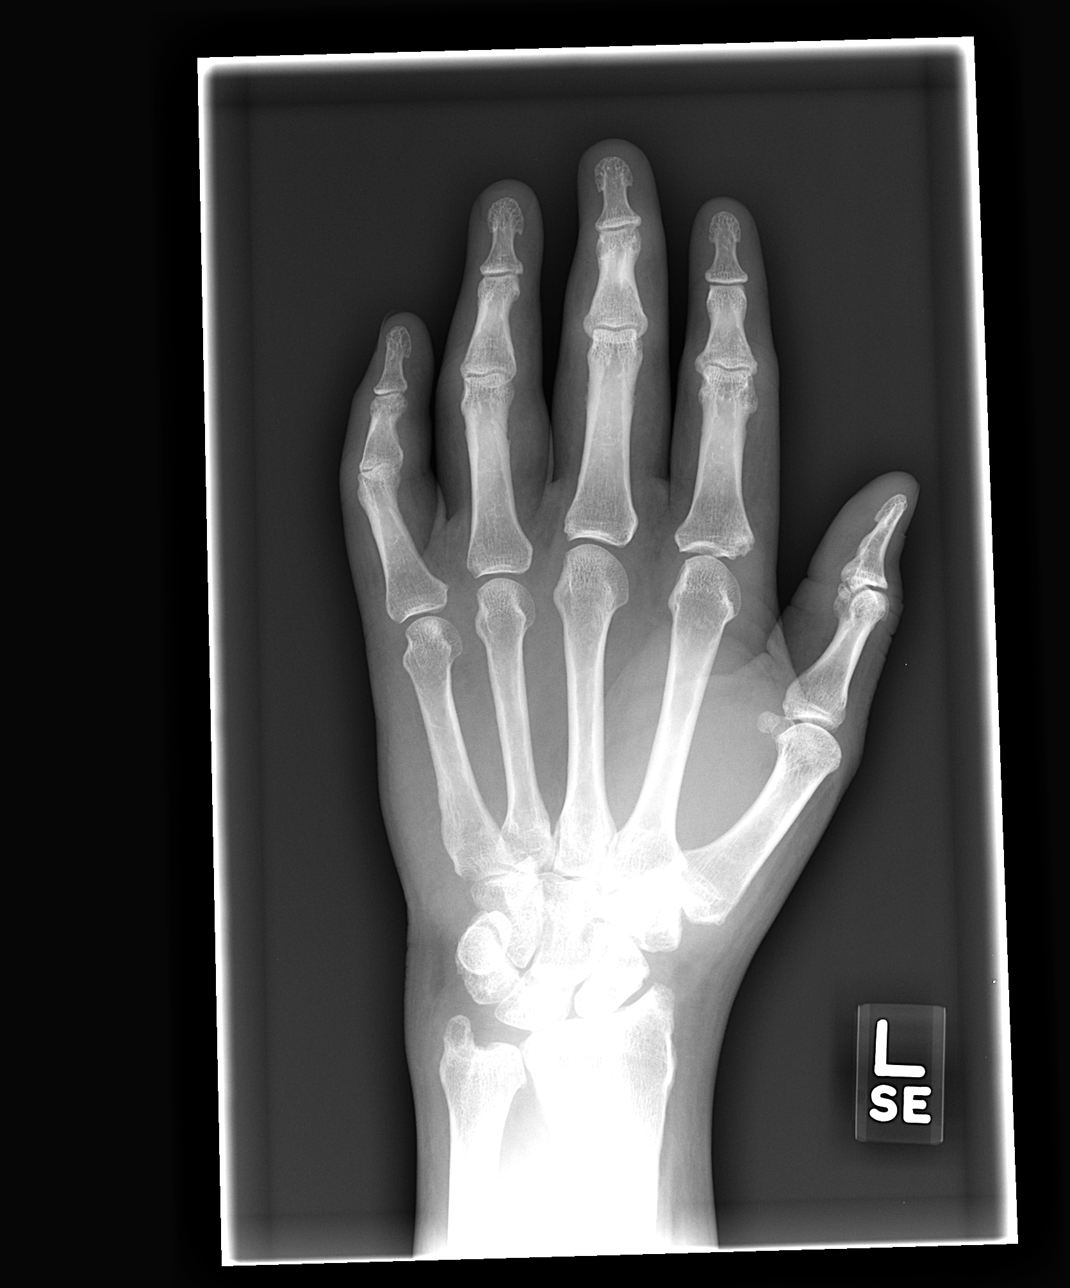

[view not recorded (2 of 3)]
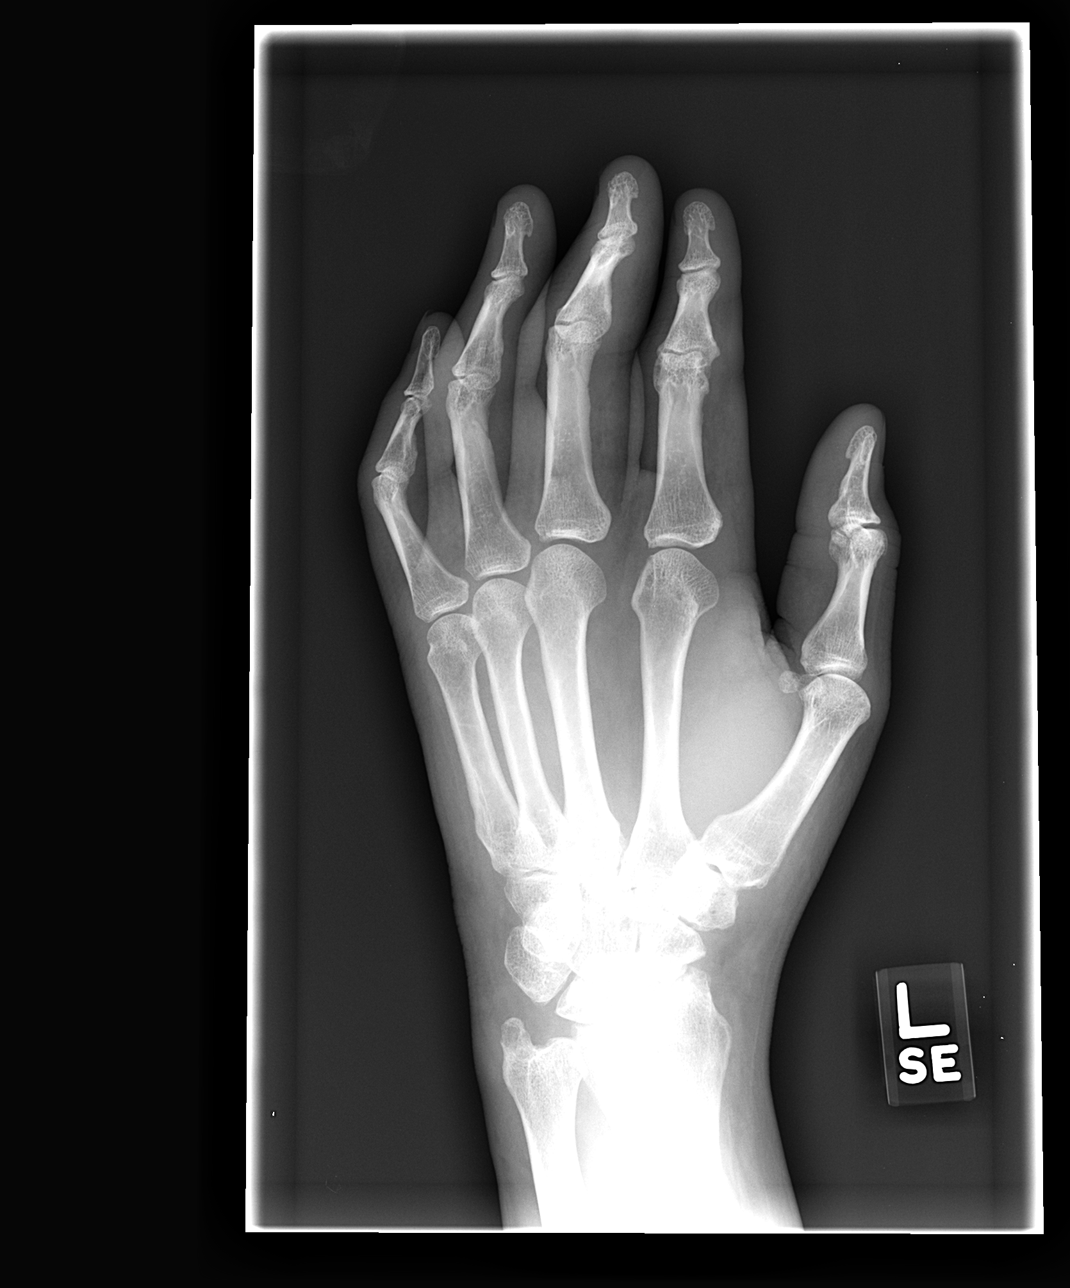

[view not recorded (3 of 3)]
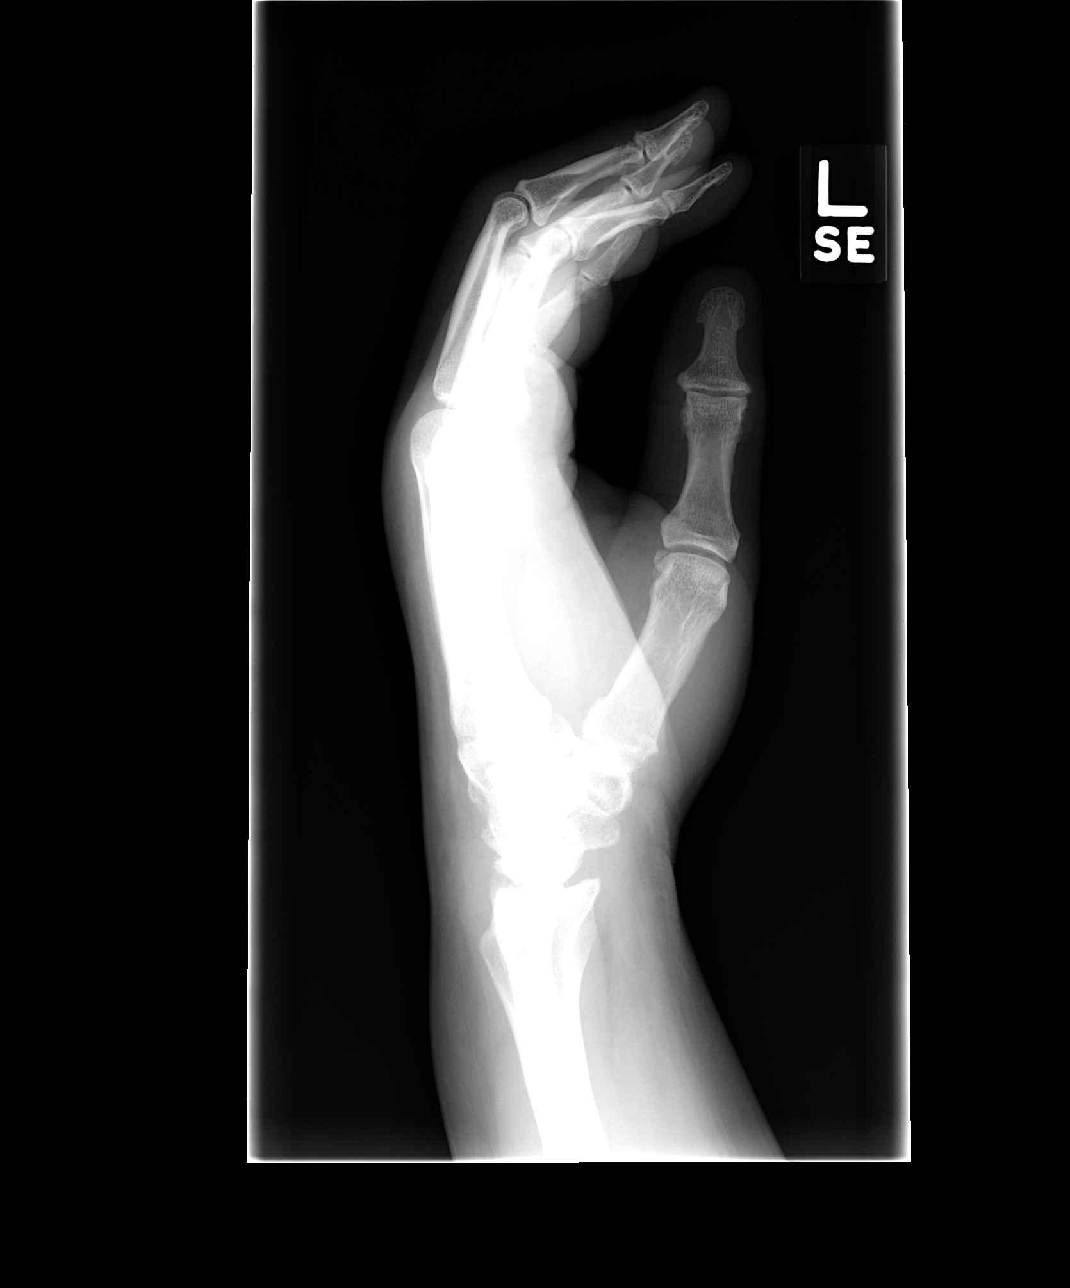

[3 of 3 positions shown; findings below may reference images not displayed]

FINDINGS: There is mild soft tissue swelling over the dorsum of the hand, but not as prominent as was noted on the prior study.  There are no definite erosions or changes of specific arthropathy or changes of synovitis.  There is a lucency in the tip of the ulnar styloid process which is likely a bone cyst.
IMPRESSION: Soft tissue swelling noted over the dorsum of the hand ? otherwise unremarkable.

## 2008-04-01 ENCOUNTER — Emergency Department (HOSPITAL_COMMUNITY): Admission: EM | Admit: 2008-04-01 | Discharge: 2008-04-01 | Payer: Self-pay | Admitting: Emergency Medicine

## 2008-04-02 IMAGING — CR DG ELBOW COMPLETE 3+V*L*
4 series · 4 of 4 positions shown · non-contrast
Comparison: none

HISTORY: Left elbow pain

LEFT ELBOW 4 VIEWS:
Elbow joint spaces preserved.
Tiny olecranon spur.
Small elbow joint effusion.
Diffuse soft tissue swelling at elbow region.
No fracture, dislocation, bone destruction.

[view not recorded (1 of 4)]
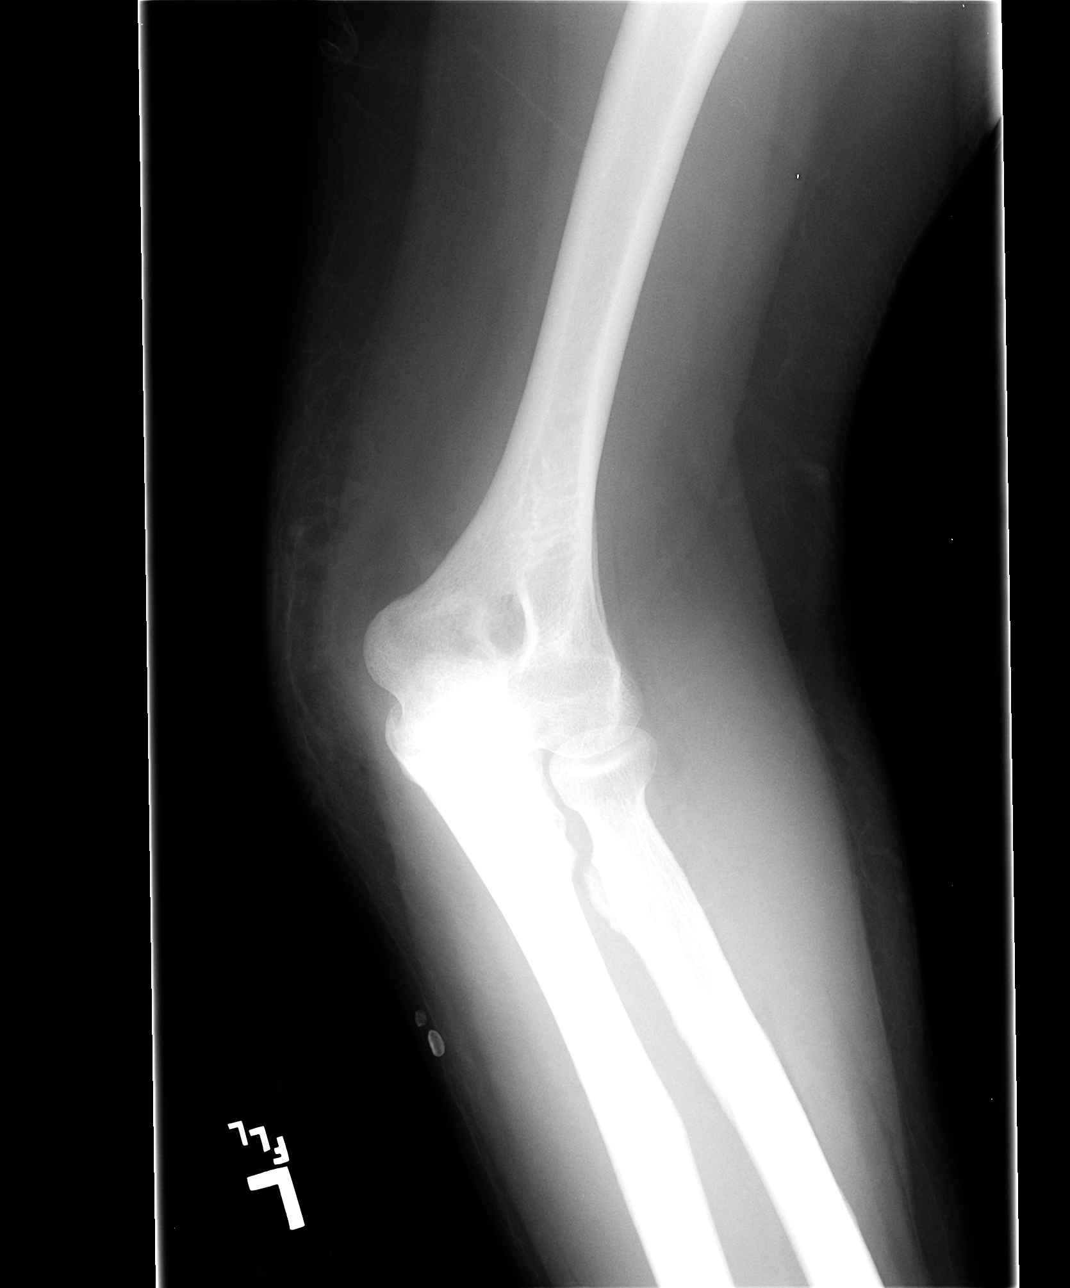

[view not recorded (2 of 4)]
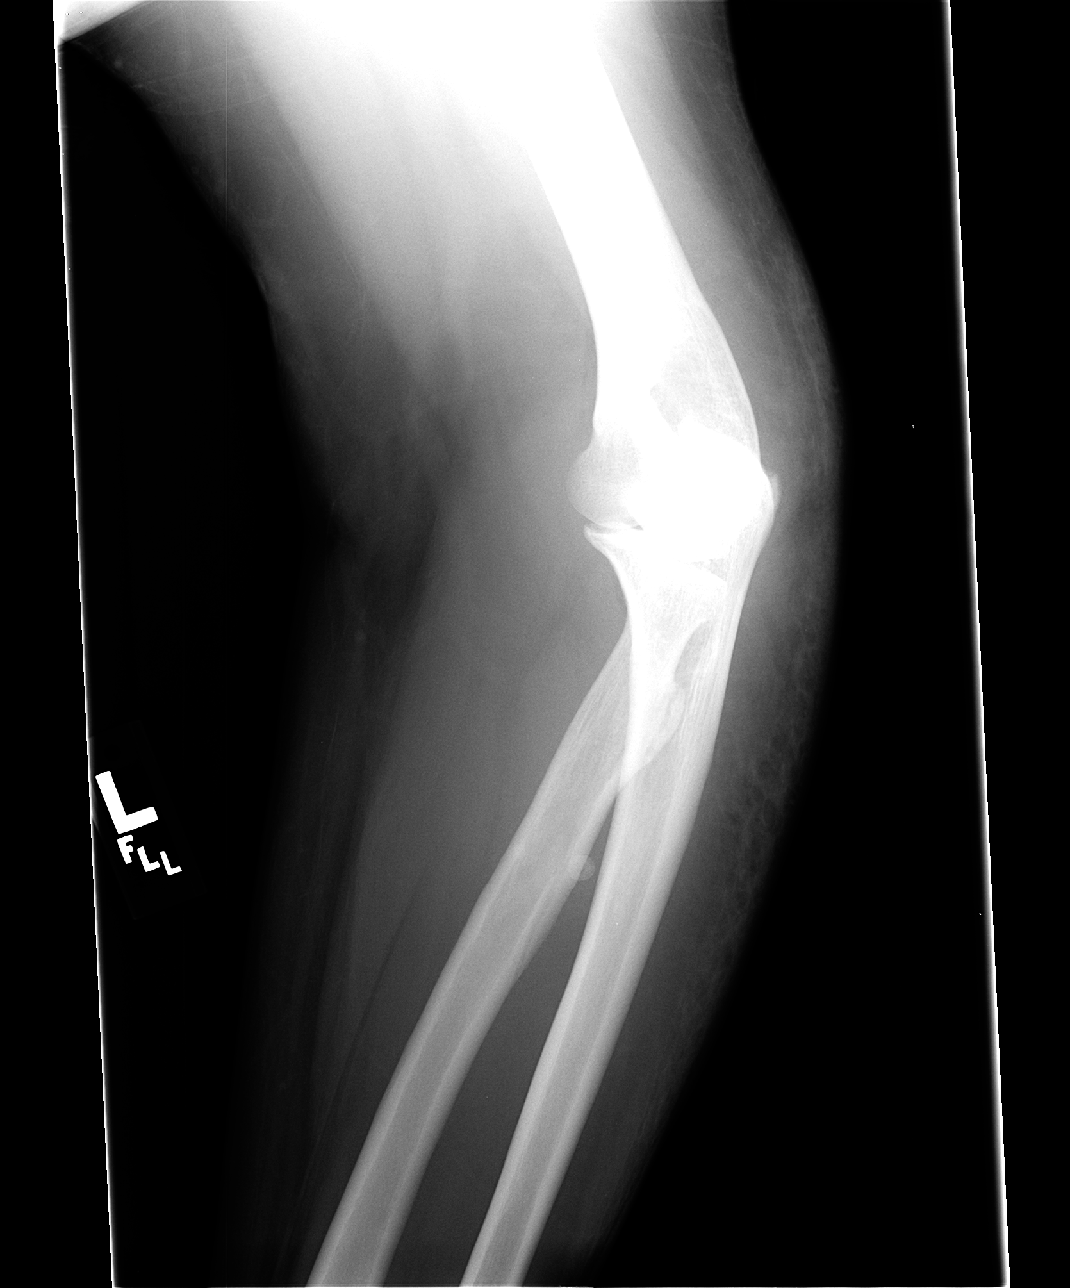

[view not recorded (3 of 4)]
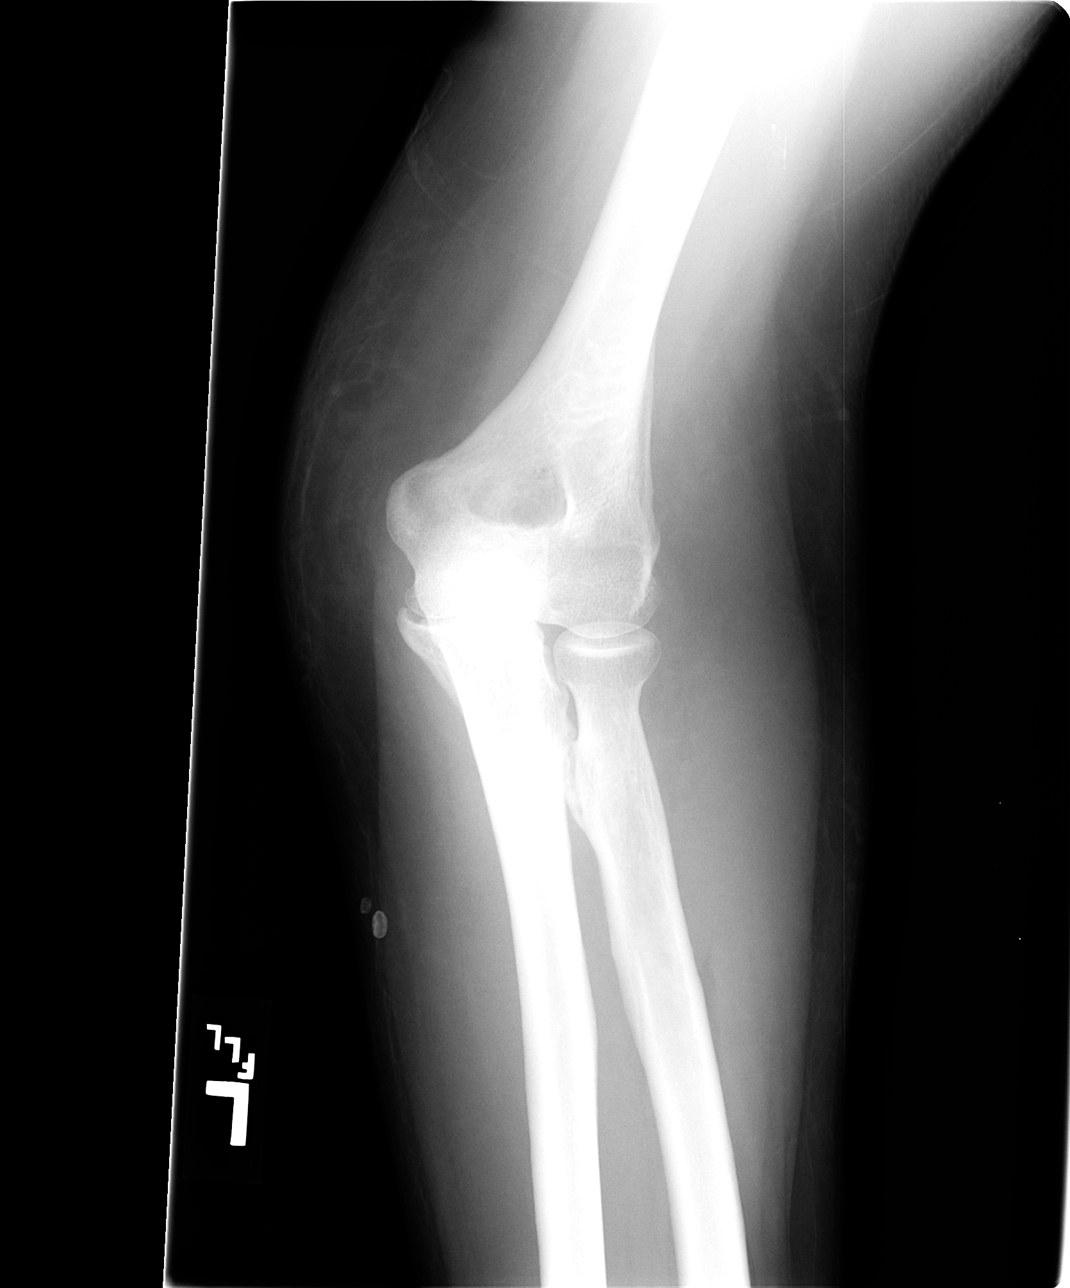

[view not recorded (4 of 4)]
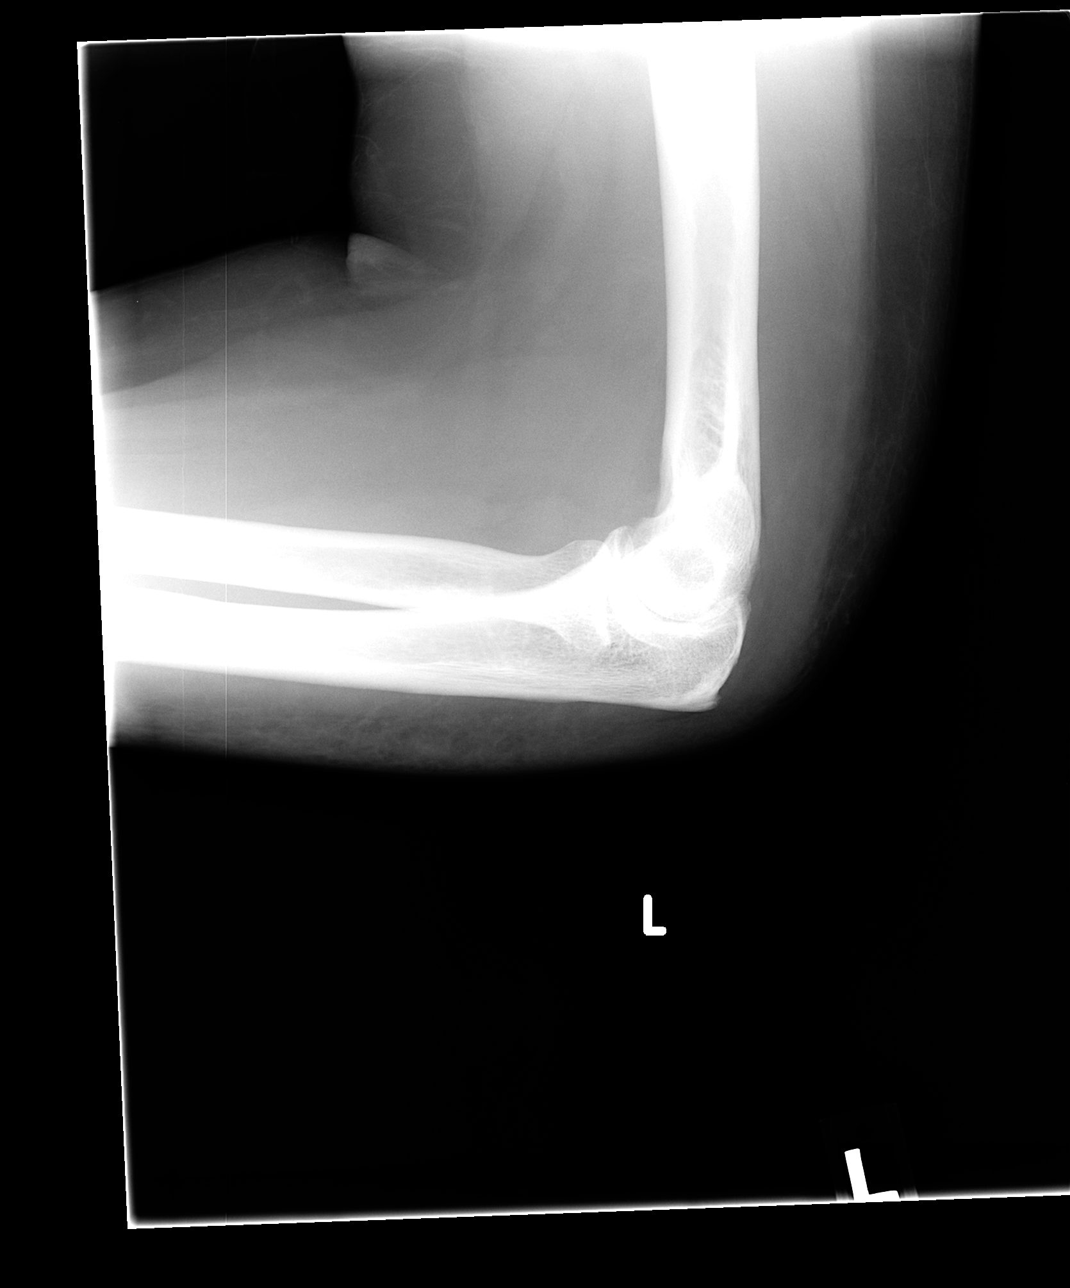

[4 of 4 positions shown; findings below may reference images not displayed]

IMPRESSION: Soft tissue swelling and small elbow joint effusion without acute bony
abnormality.

## 2008-04-10 ENCOUNTER — Emergency Department (HOSPITAL_COMMUNITY): Admission: EM | Admit: 2008-04-10 | Discharge: 2008-04-10 | Payer: Self-pay | Admitting: Emergency Medicine

## 2008-04-12 IMAGING — CT CT ABDOMEN W/O CM
1 of 2 series · 15 of 32 positions shown, 19 images · non-contrast
Comparison: none

CLINICAL DATA: Abdominal pain. 
 ABDOMEN CT WITHOUT CONTRAST ? 10/10/06 AT 8088 HOURS:
TECHNIQUE: Multidetector CT imaging of the abdomen was performed following the standard protocol without IV contrast.
TECHNIQUE: Multidetector CT imaging of the pelvis was performed following the standard protocol without IV contrast.

[Series 2: stone 5.0 b40f · axial · 0.92mm/px · z∈[-468,+6]mm · 15 of 105 slices shown, 19 images]
[im 5/105  soft-tissue]
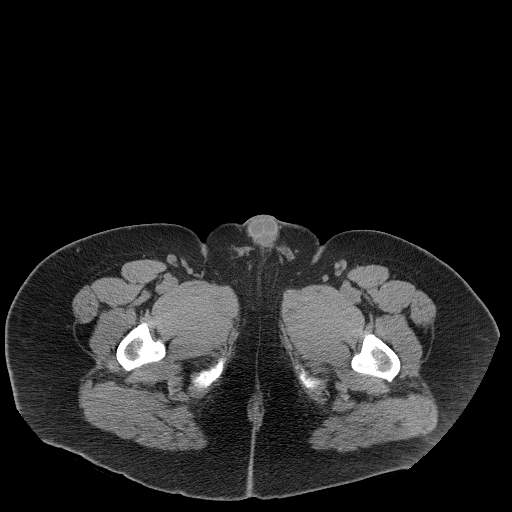
[im 5/105  bone]
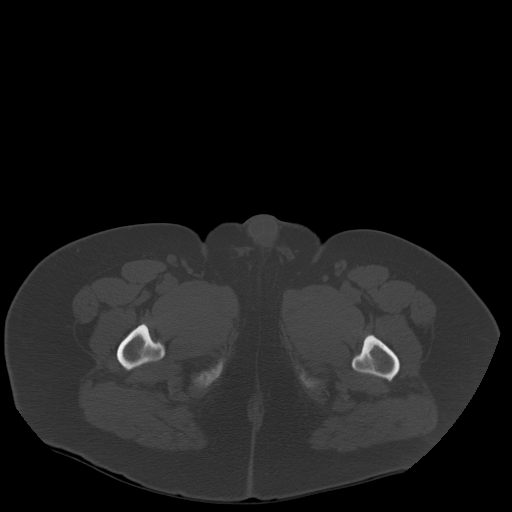
[im 14/105  soft-tissue]
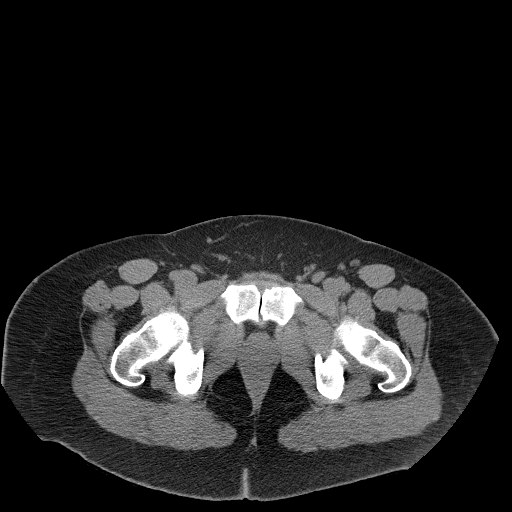
[im 23/105  soft-tissue]
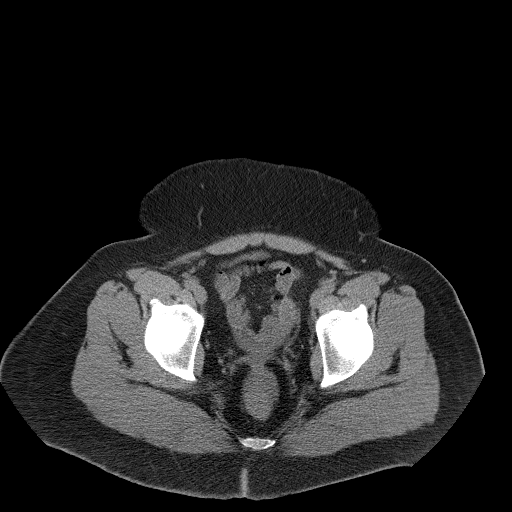
[im 28/105  soft-tissue]
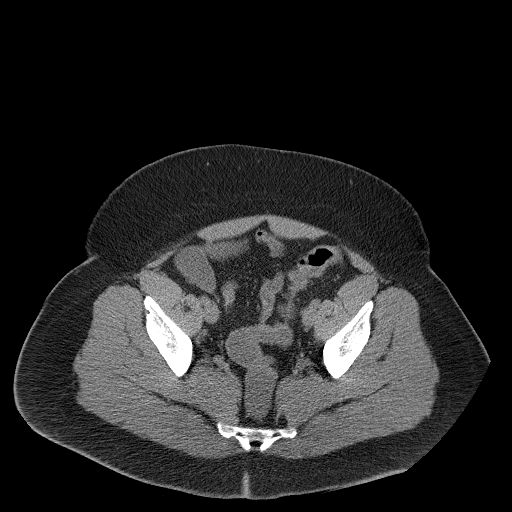
[im 37/105  soft-tissue]
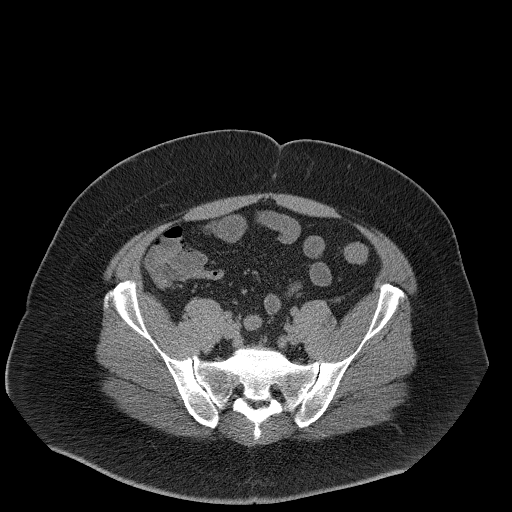
[im 46/105  soft-tissue]
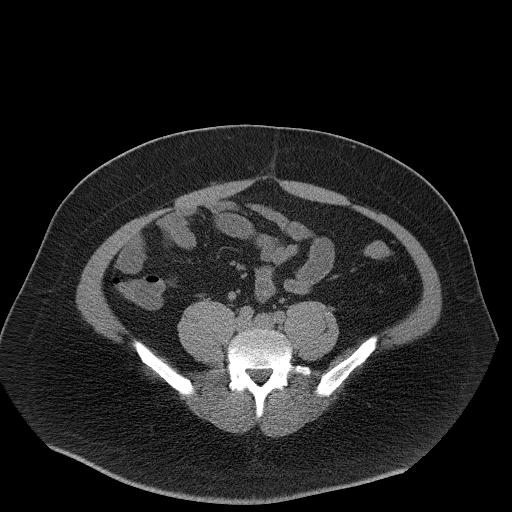
[im 55/105  soft-tissue]
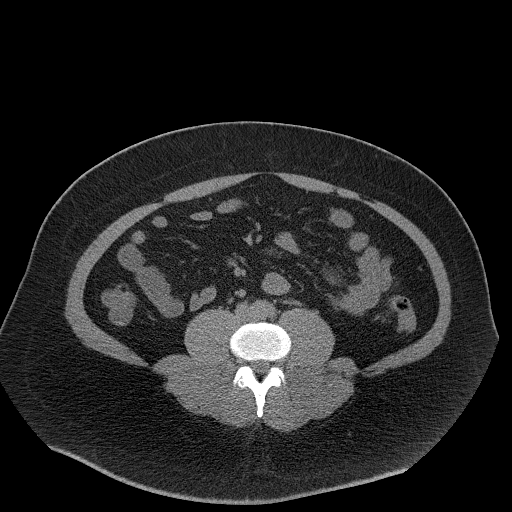
[im 59/105  soft-tissue]
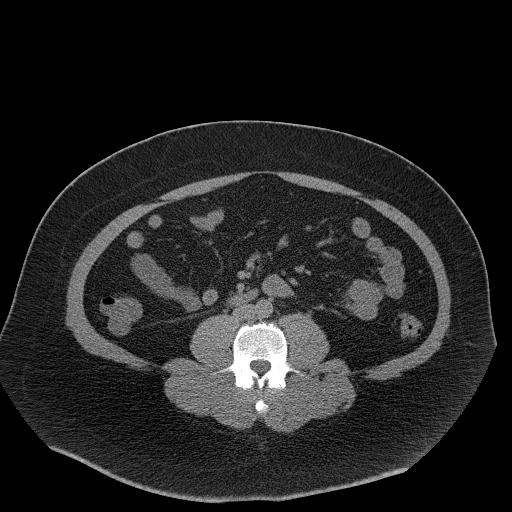
[im 68/105  soft-tissue]
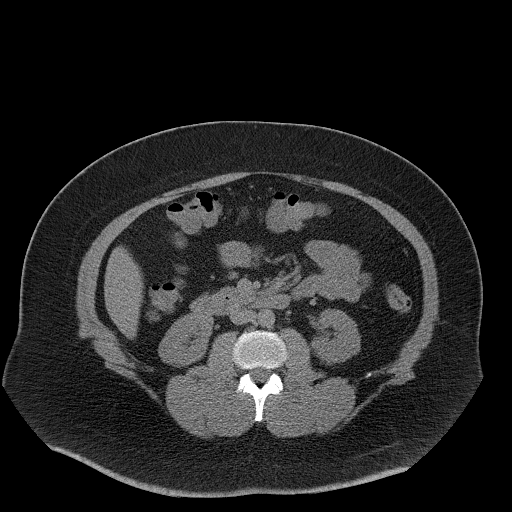
[im 68/105  bone]
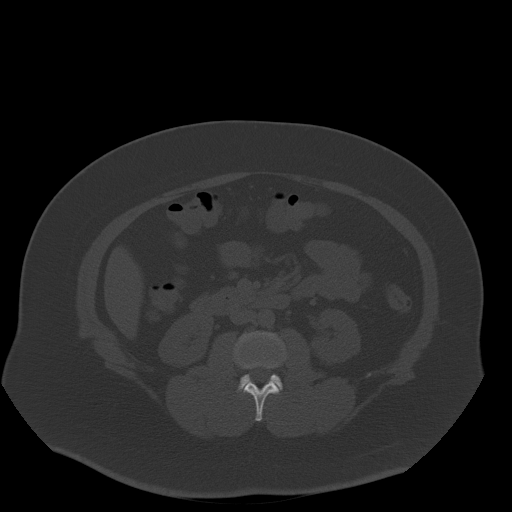
[im 77/105  soft-tissue]
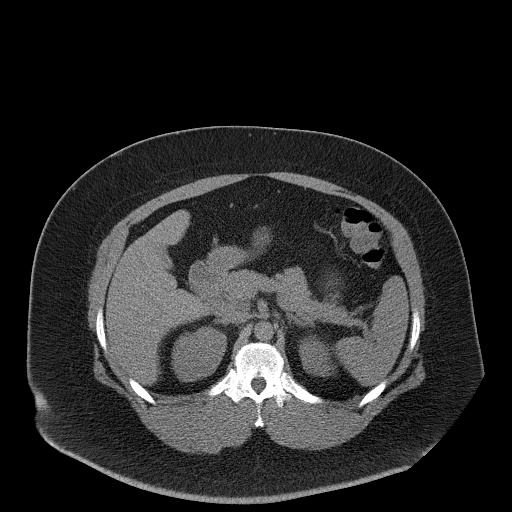
[im 82/105  soft-tissue]
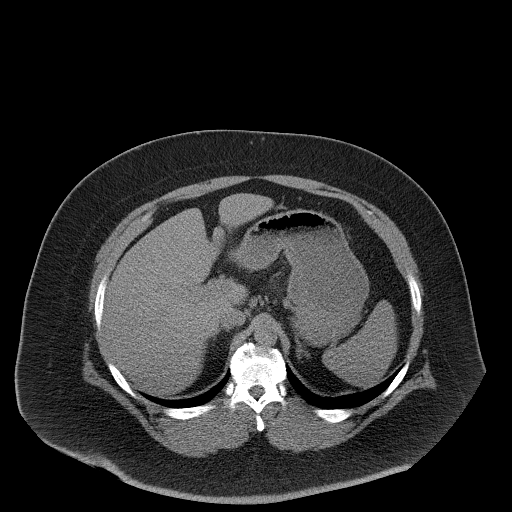
[im 86/105  lung]
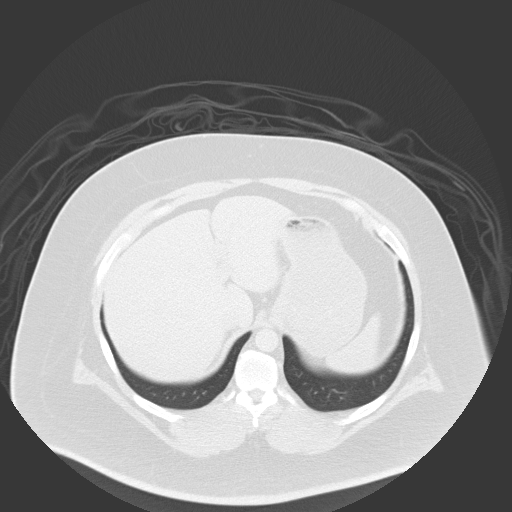
[im 91/105  soft-tissue]
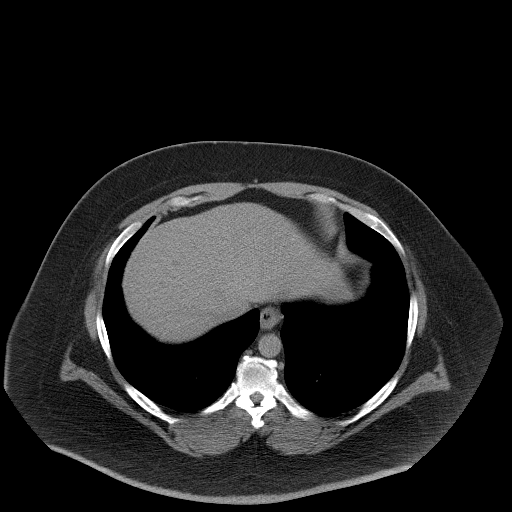
[im 91/105  lung]
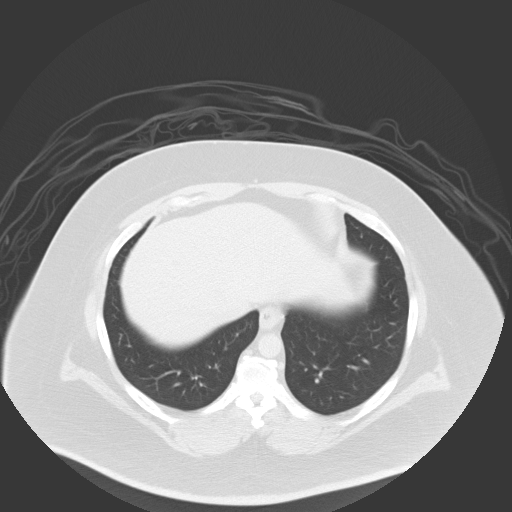
[im 95/105  lung]
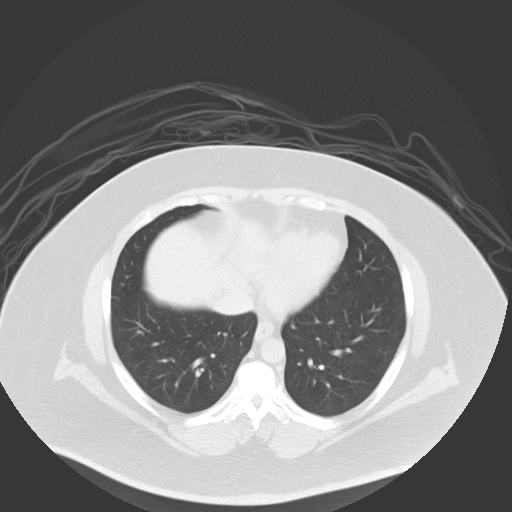
[im 100/105  soft-tissue]
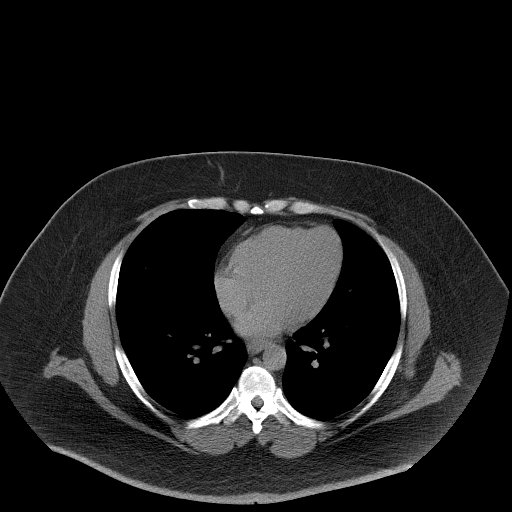
[im 100/105  lung]
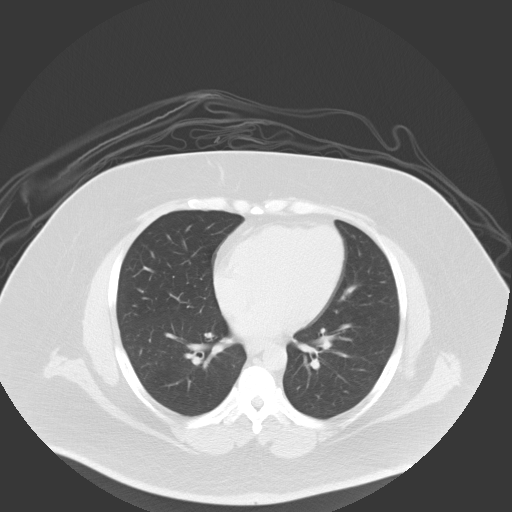

[15 of 32 positions shown; findings below may reference images not displayed]

FINDINGS: There is no hydronephrosis or nephrolithiasis.  No ureteral calculi are seen.  There is a 1 cm hypodensity at the dome of the liver anteriorly on image #11.  The gallbladder is decompressed.  The spleen, pancreas, and adrenal glands are unremarkable.  Negative free fluid.
IMPRESSION: 1.  No urinary calculus or hydronephrosis.
 2.  Subtle hypodensity in the liver, nonspecific.  If the patient has a history of cancer or if there is strong concern for metastatic disease, MR is warranted.
FINDINGS: No ureteral calculi are present.  Phleboliths are noted.  The bladder is decompressed.  The appendix is normal.  Negative free fluid.  A right inguinal hernia containing only fat is present.
IMPRESSION: No urinary calculus.

## 2008-05-01 ENCOUNTER — Emergency Department (HOSPITAL_COMMUNITY): Admission: EM | Admit: 2008-05-01 | Discharge: 2008-05-01 | Payer: Self-pay | Admitting: Emergency Medicine

## 2008-05-12 ENCOUNTER — Emergency Department (HOSPITAL_COMMUNITY): Admission: EM | Admit: 2008-05-12 | Discharge: 2008-05-12 | Payer: Self-pay | Admitting: Emergency Medicine

## 2008-06-06 ENCOUNTER — Emergency Department (HOSPITAL_COMMUNITY): Admission: EM | Admit: 2008-06-06 | Discharge: 2008-06-06 | Payer: Self-pay | Admitting: Emergency Medicine

## 2008-06-17 ENCOUNTER — Emergency Department (HOSPITAL_COMMUNITY): Admission: EM | Admit: 2008-06-17 | Discharge: 2008-06-17 | Payer: Self-pay | Admitting: Emergency Medicine

## 2008-06-20 ENCOUNTER — Ambulatory Visit: Payer: Self-pay | Admitting: Orthopedic Surgery

## 2008-06-20 ENCOUNTER — Inpatient Hospital Stay (HOSPITAL_COMMUNITY): Admission: EM | Admit: 2008-06-20 | Discharge: 2008-06-24 | Payer: Self-pay | Admitting: Emergency Medicine

## 2008-06-22 ENCOUNTER — Encounter: Payer: Self-pay | Admitting: Orthopedic Surgery

## 2008-07-26 ENCOUNTER — Emergency Department (HOSPITAL_COMMUNITY): Admission: EM | Admit: 2008-07-26 | Discharge: 2008-07-26 | Payer: Self-pay | Admitting: Emergency Medicine

## 2008-09-08 ENCOUNTER — Emergency Department (HOSPITAL_COMMUNITY): Admission: EM | Admit: 2008-09-08 | Discharge: 2008-09-08 | Payer: Self-pay | Admitting: Emergency Medicine

## 2008-09-19 ENCOUNTER — Encounter (HOSPITAL_COMMUNITY): Admission: RE | Admit: 2008-09-19 | Discharge: 2008-10-19 | Payer: Self-pay | Admitting: Oncology

## 2008-09-19 ENCOUNTER — Ambulatory Visit (HOSPITAL_COMMUNITY): Payer: Self-pay | Admitting: Oncology

## 2008-10-17 ENCOUNTER — Emergency Department (HOSPITAL_COMMUNITY): Admission: EM | Admit: 2008-10-17 | Discharge: 2008-10-17 | Payer: Self-pay | Admitting: Emergency Medicine

## 2008-10-20 ENCOUNTER — Emergency Department (HOSPITAL_COMMUNITY): Admission: EM | Admit: 2008-10-20 | Discharge: 2008-10-20 | Payer: Self-pay | Admitting: Emergency Medicine

## 2008-10-23 ENCOUNTER — Emergency Department (HOSPITAL_COMMUNITY): Admission: EM | Admit: 2008-10-23 | Discharge: 2008-10-23 | Payer: Self-pay | Admitting: Emergency Medicine

## 2008-10-26 ENCOUNTER — Emergency Department (HOSPITAL_COMMUNITY): Admission: EM | Admit: 2008-10-26 | Discharge: 2008-10-26 | Payer: Self-pay | Admitting: Emergency Medicine

## 2008-11-03 IMAGING — CR DG CHEST 1V PORT
1 series · 1 of 1 positions shown · non-contrast
Comparison: 05/30/06.

CLINICAL DATA: 41 year-old-male with chest pain. 
 PORTABLE CHEST - 1 VIEW ? 05/03/07:

[view not recorded]
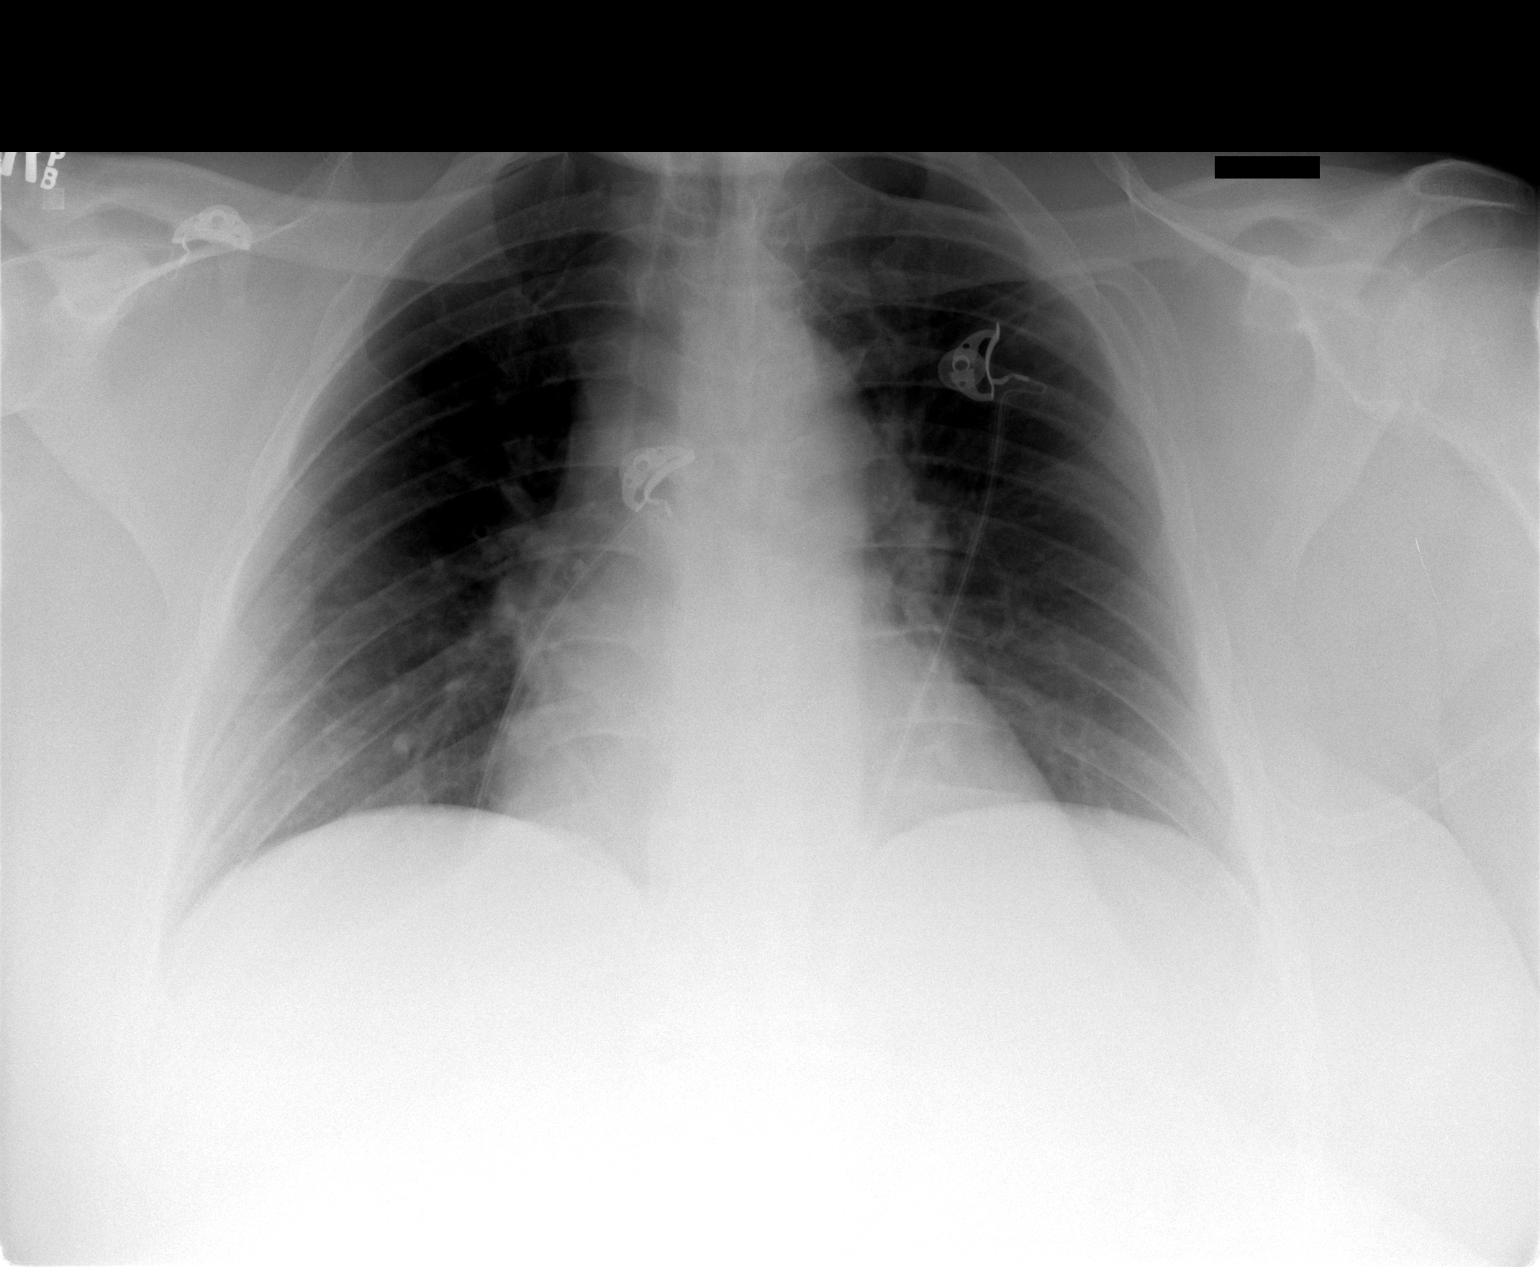

[1 of 1 positions shown; findings below may reference images not displayed]

FINDINGS: Low lung volumes with vascular crowding and atelectasis.  Heart size is within normal limits and stable.  The lungs are clear.
IMPRESSION: Low volume chest film with vascular crowding and atelectasis.

## 2008-11-10 ENCOUNTER — Emergency Department (HOSPITAL_COMMUNITY): Admission: EM | Admit: 2008-11-10 | Discharge: 2008-11-10 | Payer: Self-pay | Admitting: Emergency Medicine

## 2008-12-03 ENCOUNTER — Emergency Department (HOSPITAL_COMMUNITY): Admission: EM | Admit: 2008-12-03 | Discharge: 2008-12-03 | Payer: Self-pay | Admitting: Emergency Medicine

## 2008-12-05 ENCOUNTER — Emergency Department (HOSPITAL_COMMUNITY): Admission: EM | Admit: 2008-12-05 | Discharge: 2008-12-05 | Payer: Self-pay | Admitting: Emergency Medicine

## 2008-12-09 ENCOUNTER — Emergency Department (HOSPITAL_COMMUNITY): Admission: EM | Admit: 2008-12-09 | Discharge: 2008-12-09 | Payer: Self-pay | Admitting: Emergency Medicine

## 2009-01-23 ENCOUNTER — Emergency Department (HOSPITAL_COMMUNITY): Admission: EM | Admit: 2009-01-23 | Discharge: 2009-01-23 | Payer: Self-pay | Admitting: Emergency Medicine

## 2009-02-02 ENCOUNTER — Emergency Department (HOSPITAL_COMMUNITY): Admission: EM | Admit: 2009-02-02 | Discharge: 2009-02-02 | Payer: Self-pay | Admitting: Emergency Medicine

## 2009-02-04 ENCOUNTER — Inpatient Hospital Stay (HOSPITAL_COMMUNITY): Admission: EM | Admit: 2009-02-04 | Discharge: 2009-02-06 | Payer: Self-pay | Admitting: Emergency Medicine

## 2009-02-04 ENCOUNTER — Emergency Department (HOSPITAL_COMMUNITY): Admission: EM | Admit: 2009-02-04 | Discharge: 2009-02-04 | Payer: Self-pay | Admitting: Emergency Medicine

## 2009-02-04 ENCOUNTER — Ambulatory Visit: Payer: Self-pay | Admitting: Orthopedic Surgery

## 2009-02-10 ENCOUNTER — Emergency Department (HOSPITAL_COMMUNITY): Admission: EM | Admit: 2009-02-10 | Discharge: 2009-02-10 | Payer: Self-pay | Admitting: Emergency Medicine

## 2009-02-18 ENCOUNTER — Ambulatory Visit: Payer: Self-pay | Admitting: Cardiology

## 2009-02-18 ENCOUNTER — Ambulatory Visit: Payer: Self-pay | Admitting: Orthopedic Surgery

## 2009-02-18 ENCOUNTER — Inpatient Hospital Stay (HOSPITAL_COMMUNITY): Admission: EM | Admit: 2009-02-18 | Discharge: 2009-03-07 | Payer: Self-pay | Admitting: Emergency Medicine

## 2009-02-19 ENCOUNTER — Encounter (INDEPENDENT_AMBULATORY_CARE_PROVIDER_SITE_OTHER): Payer: Self-pay | Admitting: Internal Medicine

## 2009-02-20 ENCOUNTER — Encounter: Payer: Self-pay | Admitting: Orthopedic Surgery

## 2009-03-02 ENCOUNTER — Ambulatory Visit: Payer: Self-pay | Admitting: Oncology

## 2009-03-09 ENCOUNTER — Other Ambulatory Visit: Payer: Self-pay | Admitting: Emergency Medicine

## 2009-03-10 ENCOUNTER — Inpatient Hospital Stay (HOSPITAL_COMMUNITY): Admission: EM | Admit: 2009-03-10 | Discharge: 2009-03-27 | Payer: Self-pay | Admitting: Internal Medicine

## 2009-03-11 ENCOUNTER — Encounter (INDEPENDENT_AMBULATORY_CARE_PROVIDER_SITE_OTHER): Payer: Self-pay | Admitting: Internal Medicine

## 2009-03-12 ENCOUNTER — Encounter (INDEPENDENT_AMBULATORY_CARE_PROVIDER_SITE_OTHER): Payer: Self-pay | Admitting: Nephrology

## 2009-03-14 ENCOUNTER — Ambulatory Visit: Payer: Self-pay | Admitting: Oncology

## 2009-03-14 ENCOUNTER — Ambulatory Visit: Payer: Self-pay | Admitting: Vascular Surgery

## 2009-04-05 ENCOUNTER — Encounter (HOSPITAL_COMMUNITY): Admission: RE | Admit: 2009-04-05 | Discharge: 2009-07-04 | Payer: Self-pay | Admitting: Internal Medicine

## 2009-05-03 ENCOUNTER — Emergency Department (HOSPITAL_COMMUNITY)
Admission: EM | Admit: 2009-05-03 | Discharge: 2009-05-03 | Payer: Self-pay | Source: Home / Self Care | Admitting: Emergency Medicine

## 2009-05-06 ENCOUNTER — Emergency Department (HOSPITAL_COMMUNITY)
Admission: EM | Admit: 2009-05-06 | Discharge: 2009-05-06 | Payer: Self-pay | Source: Home / Self Care | Admitting: Emergency Medicine

## 2009-05-06 IMAGING — CR DG HAND COMPLETE 3+V*R*
3 series · 3 of 3 positions shown · non-contrast
Comparison: Right hand radiographs 02/19/2005.

CLINICAL DATA: Pain and swelling of the right hand at the base of
the index finger.

RIGHT HAND - COMPLETE 3+ VIEW

[view not recorded (1 of 3)]
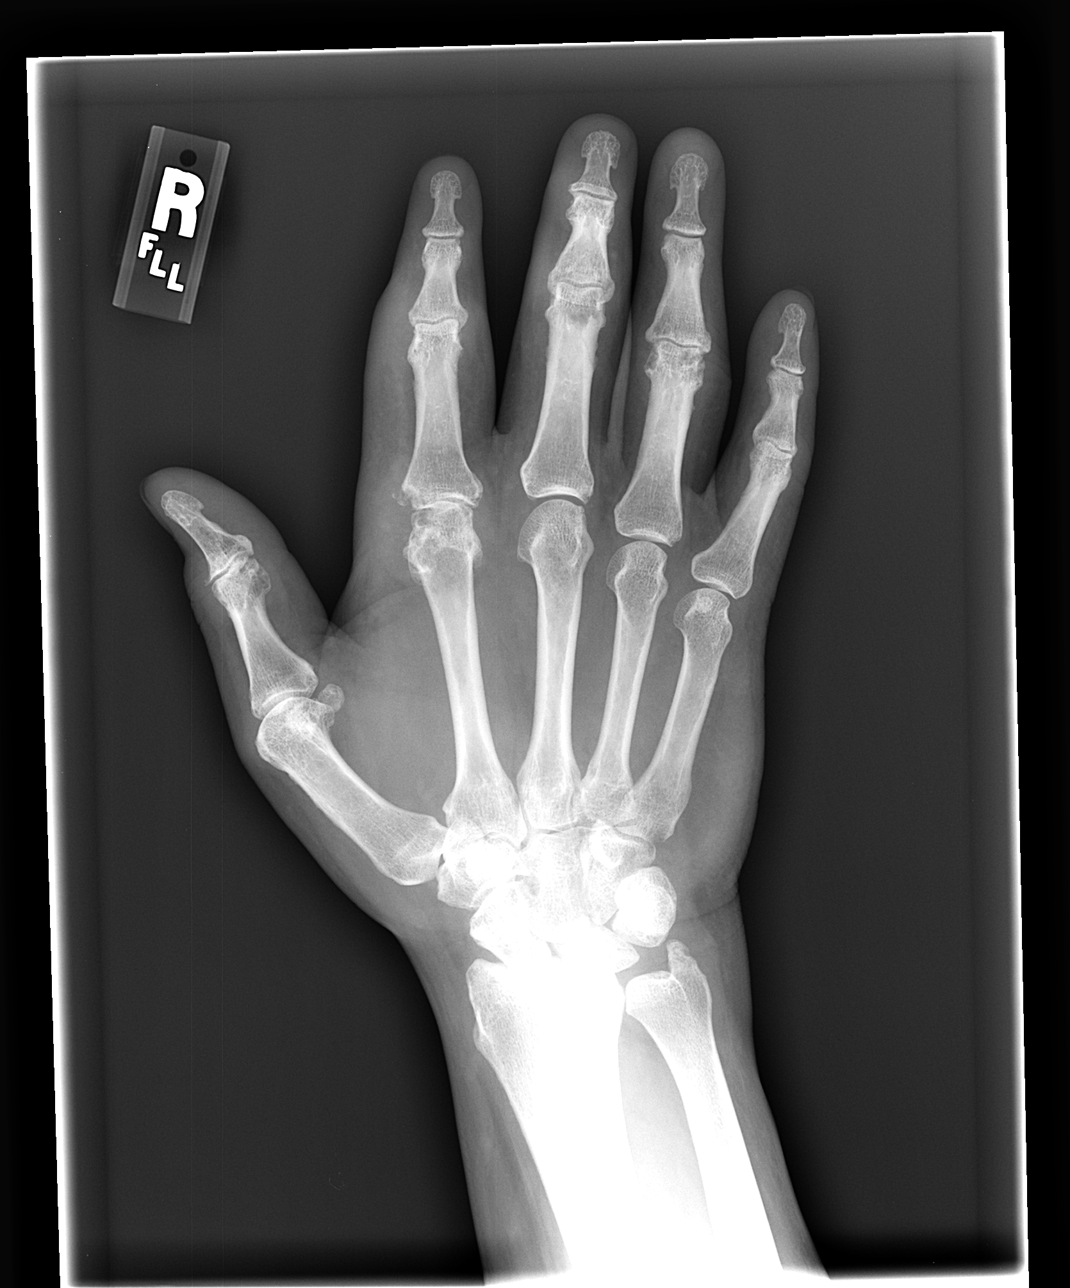

[view not recorded (2 of 3)]
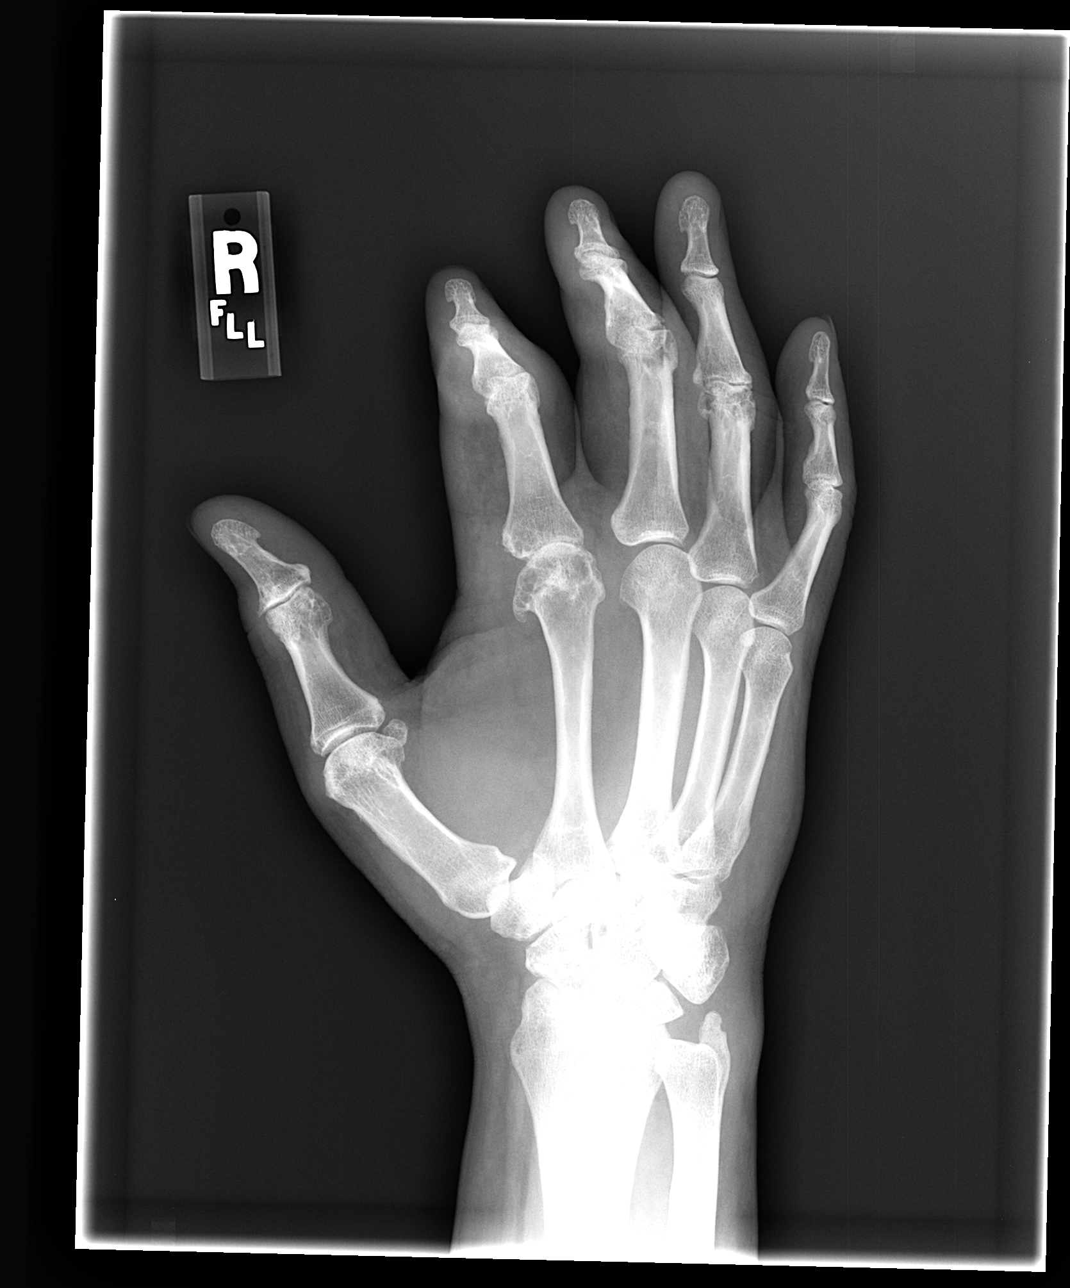

[view not recorded (3 of 3)]
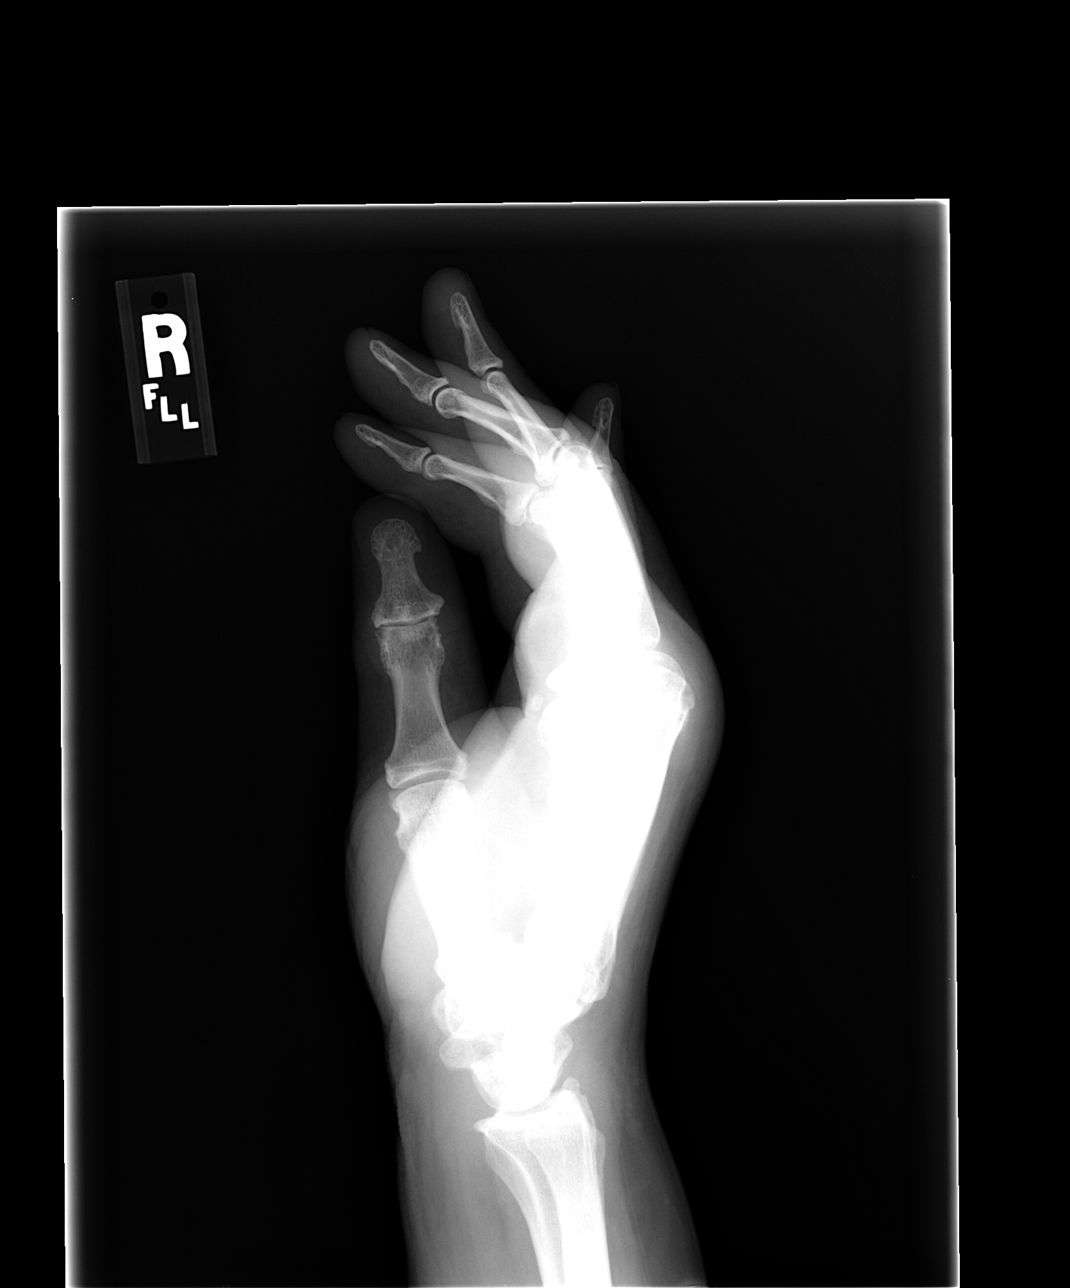

[3 of 3 positions shown; findings below may reference images not displayed]

FINDINGS: There is progressive soft tissue swelling of the second
digit proximally.  There is progressive arthropathy of the second
metacarpal phalangeal joint with progressive irregular erosions of
the base of the second proximal phalanx and the second metacarpal
head.  There are possible small intraosseous cysts in the capitate
and scaphoid, but no other erosive changes are identified.
IMPRESSION: 1.  Progressive arthropathy at the second metacarpal phalangeal
joint with associated soft tissue swelling of the index finger.  As
suggested previously, the findings are suggestive of gout.
Correlate clinically.
2.  No demonstrated acute osseous findings.

## 2009-05-17 ENCOUNTER — Encounter (HOSPITAL_COMMUNITY): Admission: RE | Admit: 2009-05-17 | Discharge: 2009-06-16 | Payer: Self-pay | Admitting: Internal Medicine

## 2009-05-31 IMAGING — US US SCROTUM
1 series · 13 of 25 positions shown · non-contrast
Comparison: None

CLINICAL DATA: Soft tissue mass.  Right scrotal wall.  Requesting
physician specified no Doppler needed.

ULTRASOUND OF SCROTUM
TECHNIQUE: Complete ultrasound examination of the testicles,
epididymis, and other scrotal structures was performed.

[Series 1: us scrotum · 50 acquisitions, 13 frames shown]
[im 1/50]
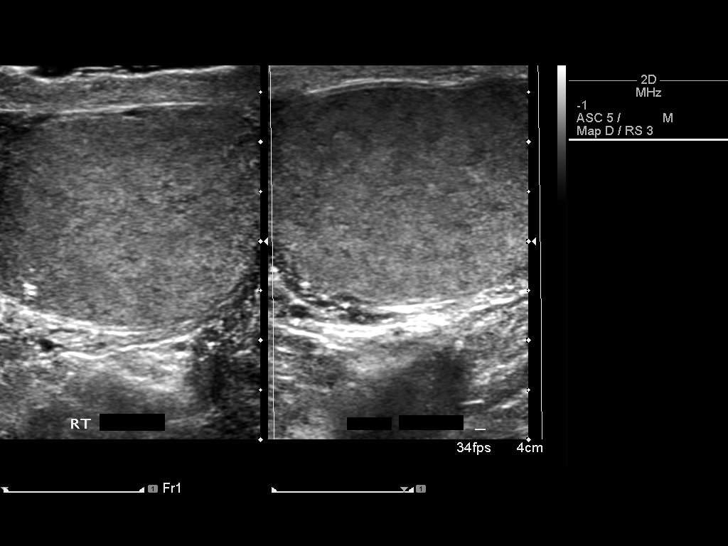
[im 5/50]
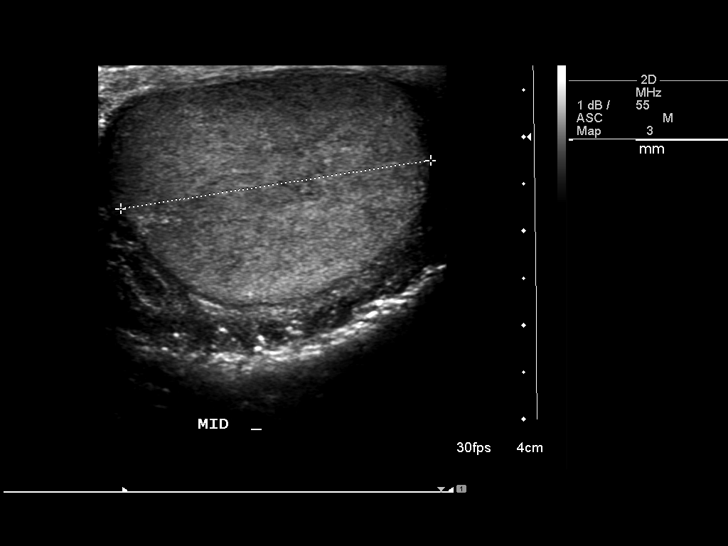
[im 9/50]
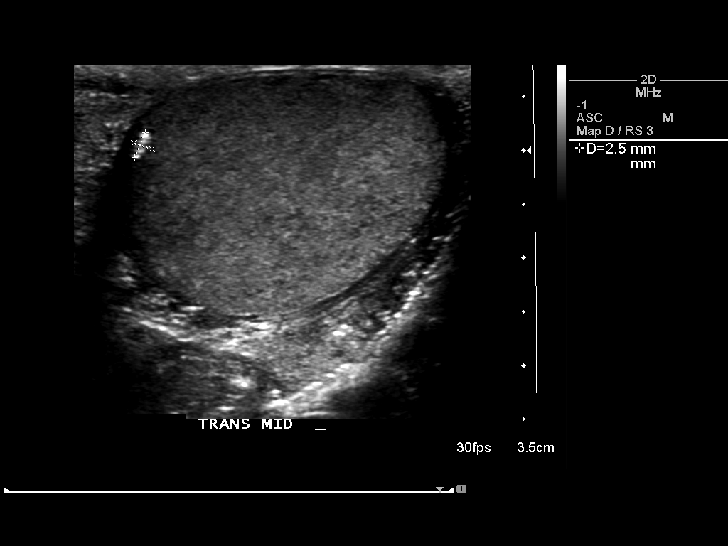
[im 13/50]
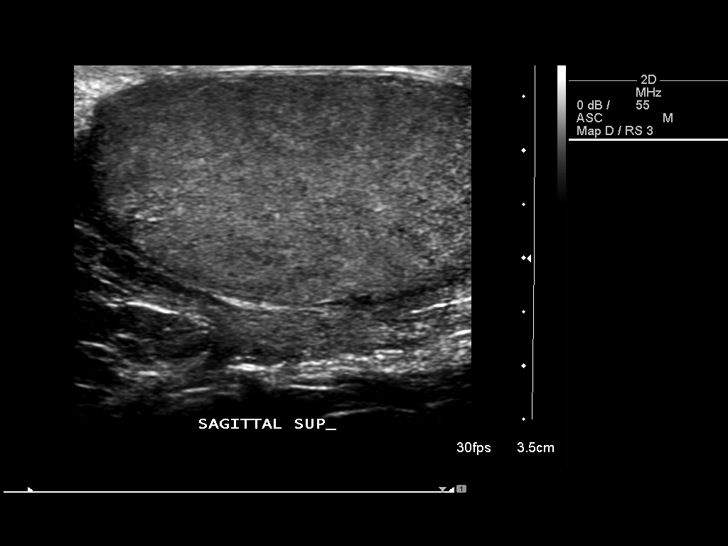
[im 17/50]
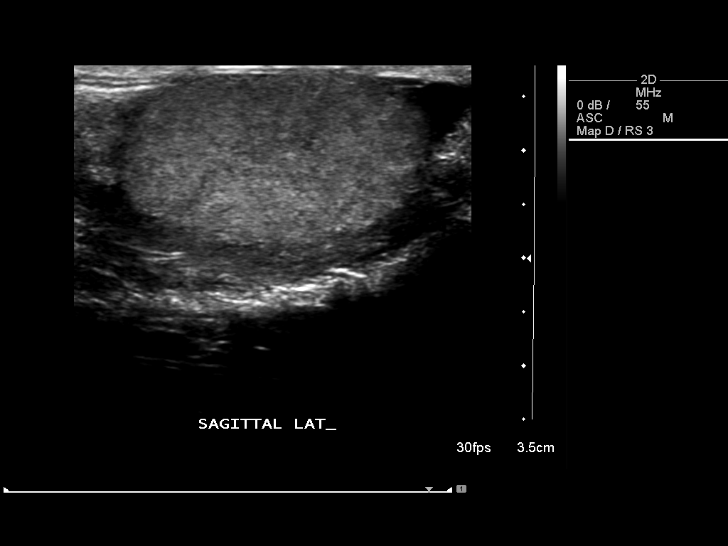
[im 21/50]
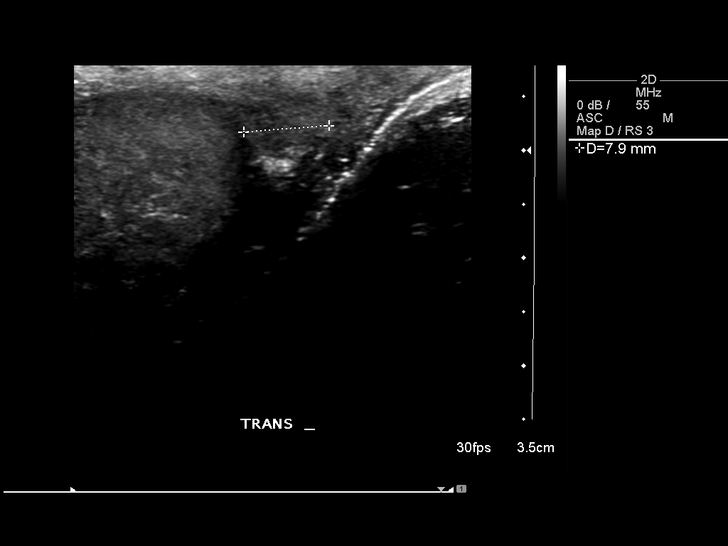
[im 25/50]
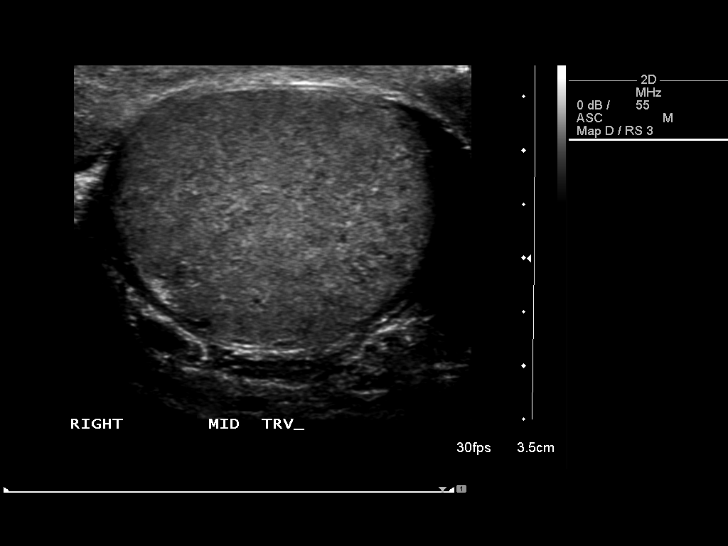
[im 29/50]
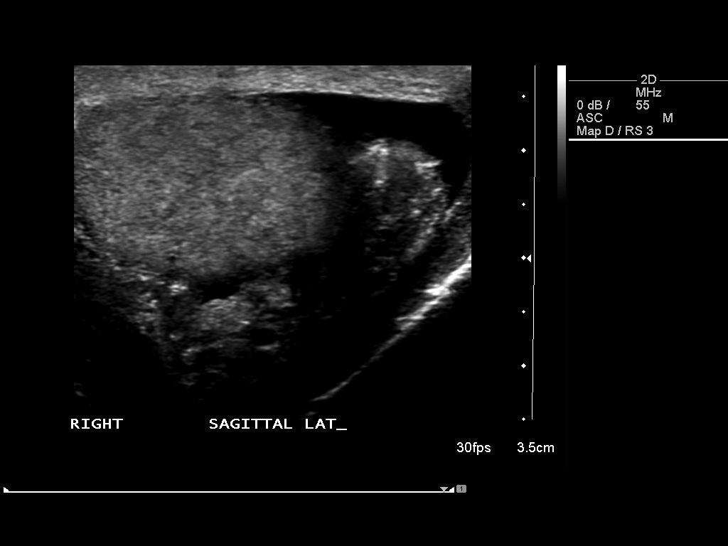
[im 33/50]
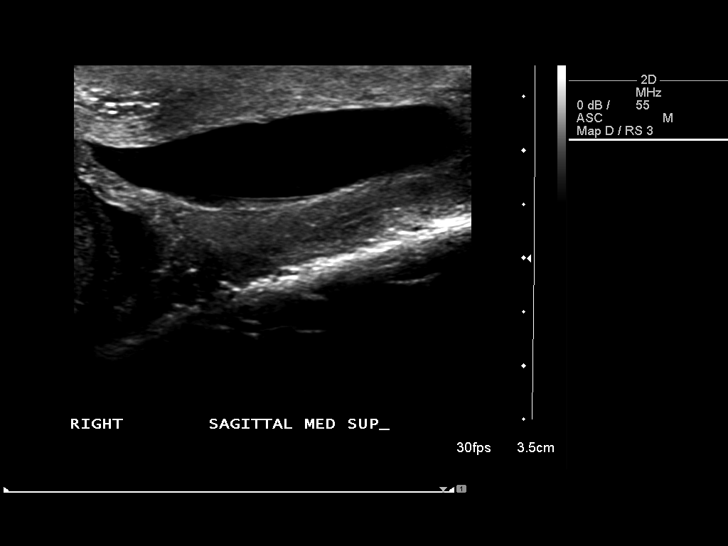
[im 37/50]
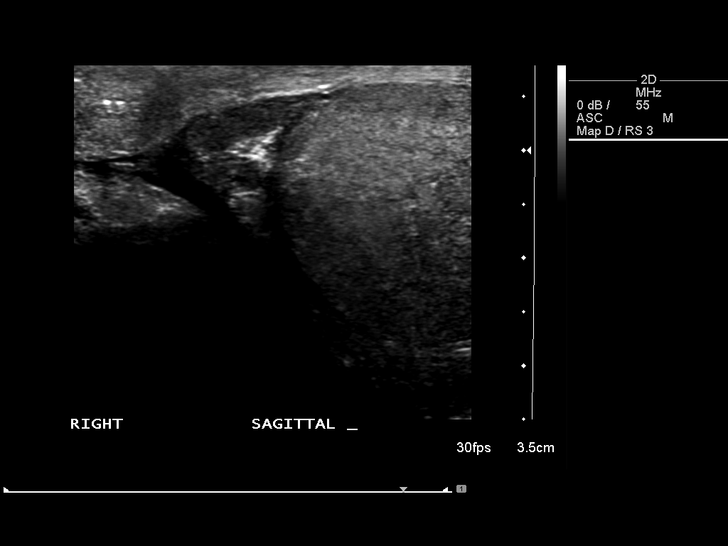
[im 41/50]
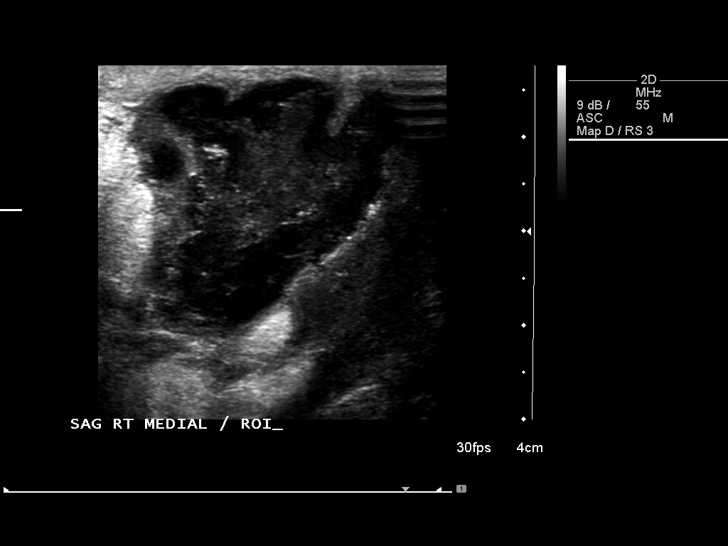
[im 45/50]
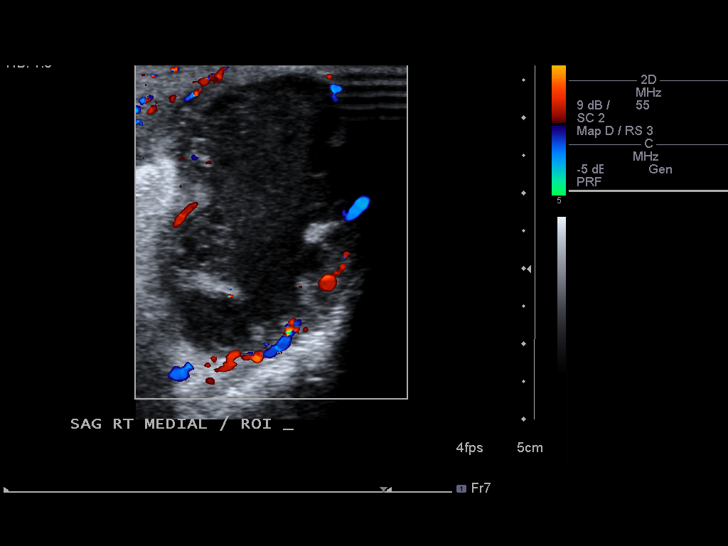
[im 50/50]
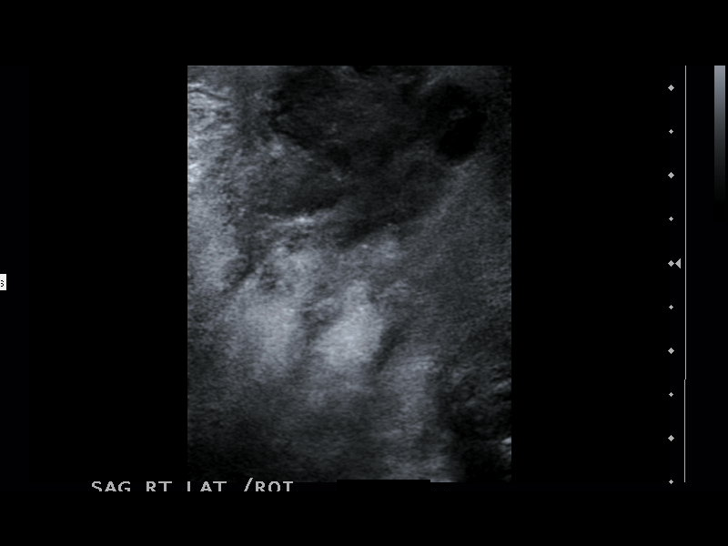

[13 of 25 positions shown; findings below may reference images not displayed]

FINDINGS: Right testicle measures 3.6 x 2.1 x 3.0 cm.  Normal color
signature.  Left testicle 3.7 x 3.3 x 2.2 cm.  Normal color
signature.  Right epididymis normal.  Left epididymis normal.
Small left hydrocele.  Moderate right hydrocele, without debris.
Small calcifications are present in the testicles bilaterally,
representing either vascular calcifications or sperm granuloma.

Heterogeneous fluid collection is present within the right lateral
scrotal soft tissues measuring 2.4 x 4.0 x 2.3 cm.  Peripheral
vascular flow is present.  This has mixed solid and cystic
echotexture.
IMPRESSION: 2.4 x 4.0 x 2.3 cm soft tissue mass in the right lateral scrotal
soft tissues is mildly hyperemic.  Differential considerations
include primarily abscess, with soft tissue hematoma felt less
likely.  There is slightly increased through transmission
suggesting central fluid.  Clinical correlation is required and
urologic consultation should be considered.

## 2009-06-18 ENCOUNTER — Encounter (HOSPITAL_COMMUNITY): Admission: RE | Admit: 2009-06-18 | Discharge: 2009-07-18 | Payer: Self-pay | Admitting: Internal Medicine

## 2009-06-26 IMAGING — CR DG KNEE COMPLETE 4+V*R*
4 series · 4 of 4 positions shown · non-contrast
Comparison: Right knee x-rays 09/07/2005.

CLINICAL DATA: Severe right knee pain and swelling.

RIGHT KNEE - COMPLETE 4+ VIEW 12/24/2007:

[view not recorded (1 of 4)]
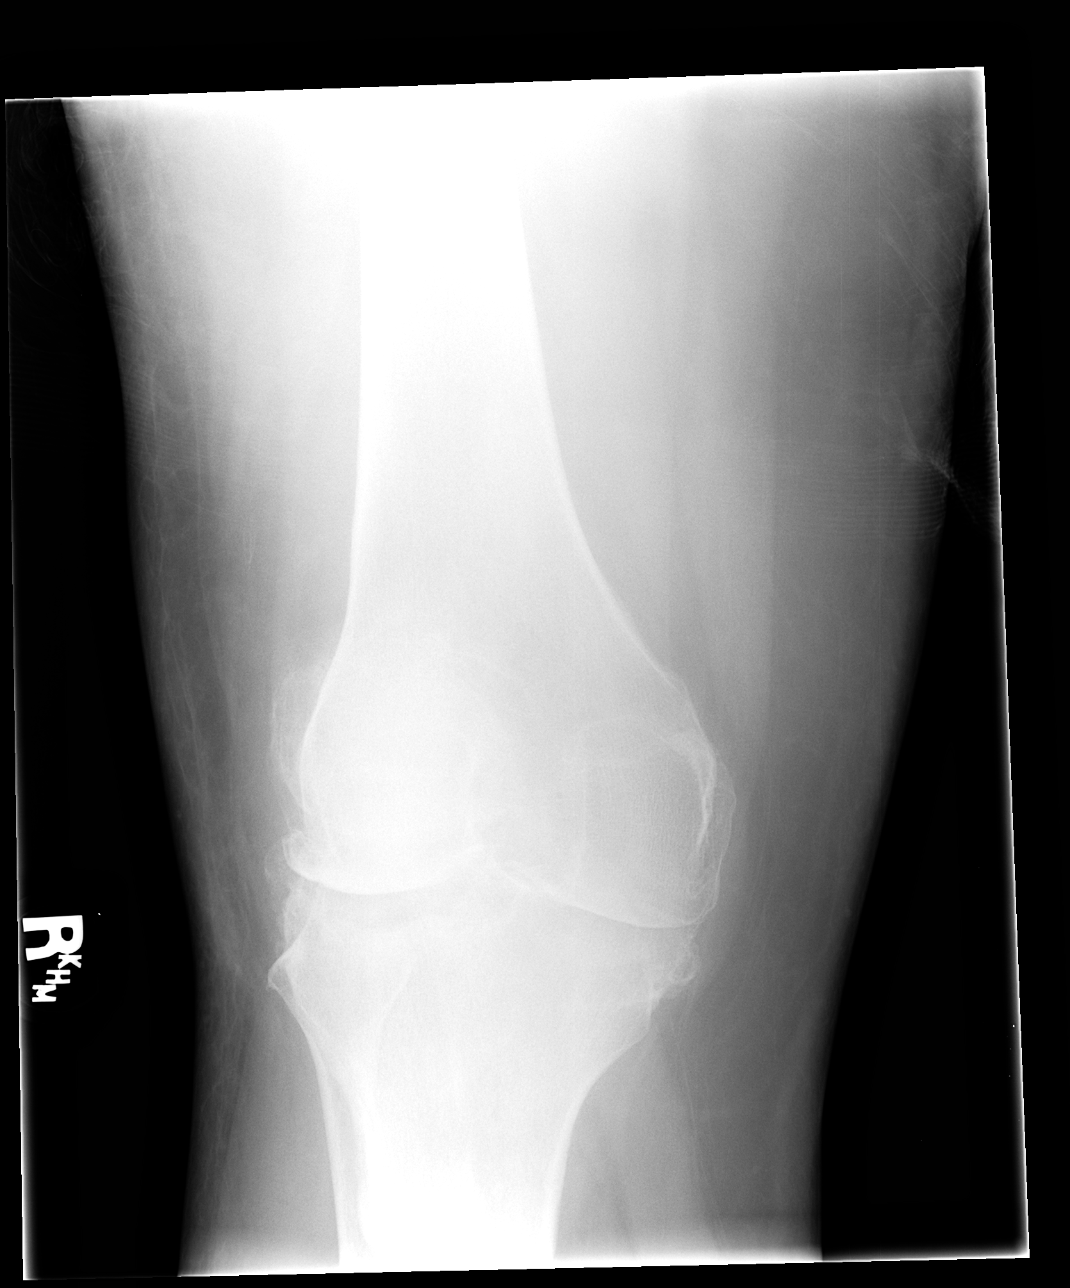

[view not recorded (2 of 4)]
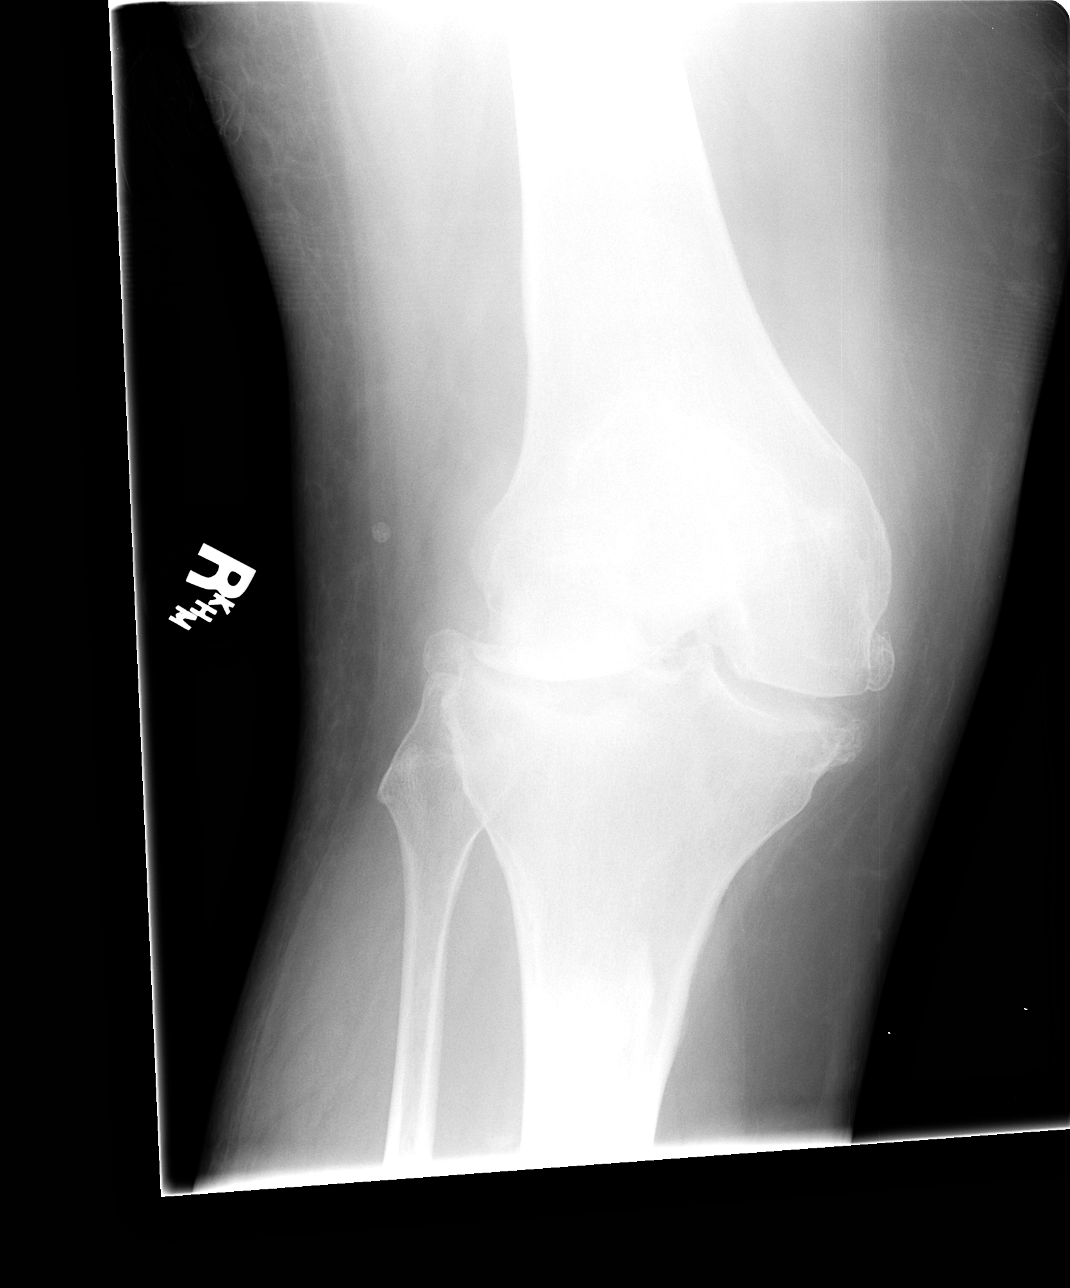

[view not recorded (3 of 4)]
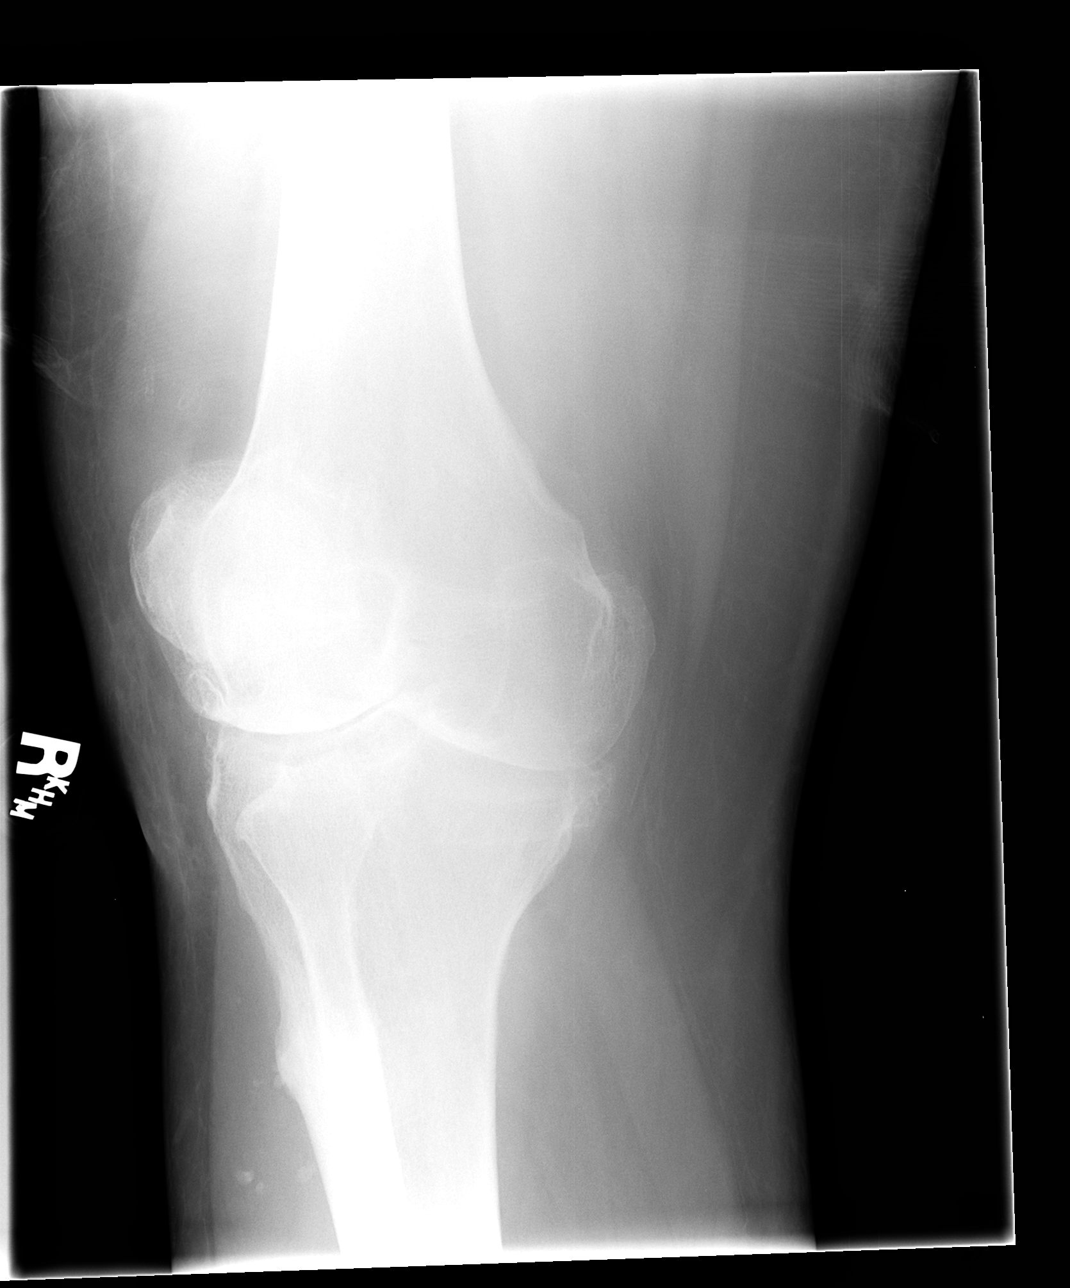

[view not recorded (4 of 4)]
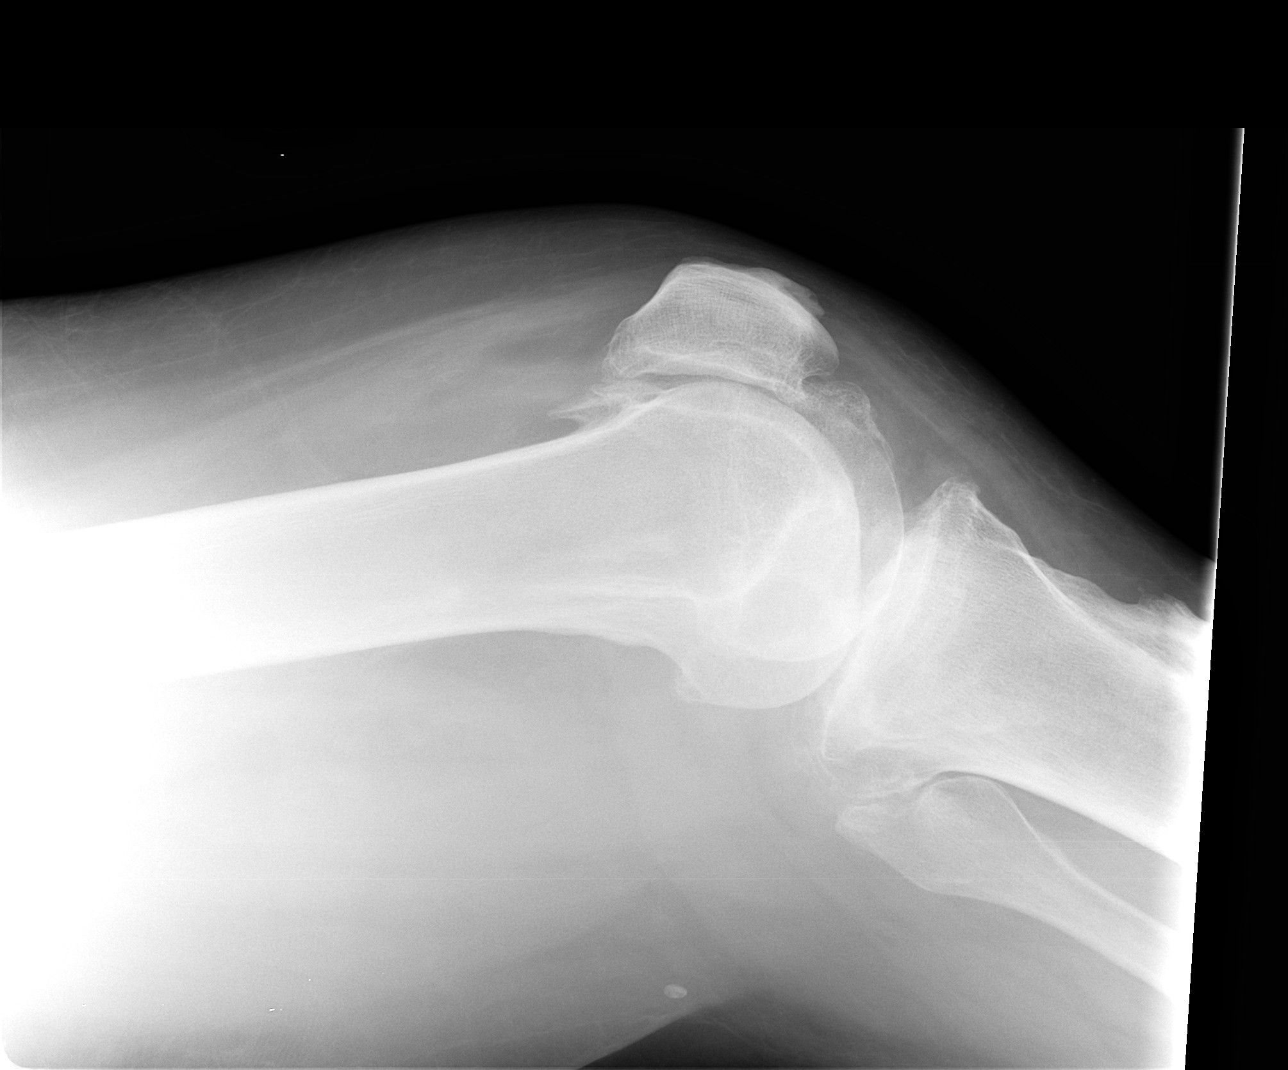

[4 of 4 positions shown; findings below may reference images not displayed]

FINDINGS: No evidence of acute fracture or dislocation.  Severe
tricompartmental joint space narrowing and associated hypertrophic
spurring.  Moderate joint effusion.  Prominent tibial tubercle.
Phleboliths in the subcutaneous tissues of the proximal calf.
Subcutaneous edema diffusely.
IMPRESSION: No acute skeletal abnormalities.  Severe tricompartmental
osteoarthritis.  Moderate joint effusion.

## 2009-07-07 ENCOUNTER — Emergency Department (HOSPITAL_COMMUNITY)
Admission: EM | Admit: 2009-07-07 | Discharge: 2009-07-07 | Payer: Self-pay | Source: Home / Self Care | Admitting: Emergency Medicine

## 2009-07-10 ENCOUNTER — Emergency Department (HOSPITAL_COMMUNITY): Admission: EM | Admit: 2009-07-10 | Discharge: 2009-07-10 | Payer: Self-pay | Admitting: Emergency Medicine

## 2009-07-13 ENCOUNTER — Emergency Department (HOSPITAL_COMMUNITY): Admission: EM | Admit: 2009-07-13 | Discharge: 2009-07-13 | Payer: Self-pay | Admitting: Emergency Medicine

## 2009-07-16 ENCOUNTER — Inpatient Hospital Stay (HOSPITAL_COMMUNITY): Admission: EM | Admit: 2009-07-16 | Discharge: 2009-07-21 | Payer: Self-pay | Admitting: Emergency Medicine

## 2009-07-23 ENCOUNTER — Emergency Department (HOSPITAL_COMMUNITY): Admission: EM | Admit: 2009-07-23 | Discharge: 2009-07-23 | Payer: Self-pay | Admitting: Emergency Medicine

## 2009-08-13 ENCOUNTER — Inpatient Hospital Stay (HOSPITAL_COMMUNITY)
Admission: EM | Admit: 2009-08-13 | Discharge: 2009-08-20 | Payer: Self-pay | Source: Home / Self Care | Admitting: Emergency Medicine

## 2009-09-11 ENCOUNTER — Inpatient Hospital Stay (HOSPITAL_COMMUNITY)
Admission: EM | Admit: 2009-09-11 | Discharge: 2009-09-14 | Payer: Self-pay | Source: Home / Self Care | Admitting: Emergency Medicine

## 2009-09-12 ENCOUNTER — Encounter (INDEPENDENT_AMBULATORY_CARE_PROVIDER_SITE_OTHER): Payer: Self-pay | Admitting: Internal Medicine

## 2009-10-03 IMAGING — CR DG CHEST 1V PORT
1 series · 1 of 1 positions shown · non-contrast
Comparison: 05/03/2007

CLINICAL DATA: Left lower extremity edema.

PORTABLE CHEST - 1 VIEW

[view not recorded]
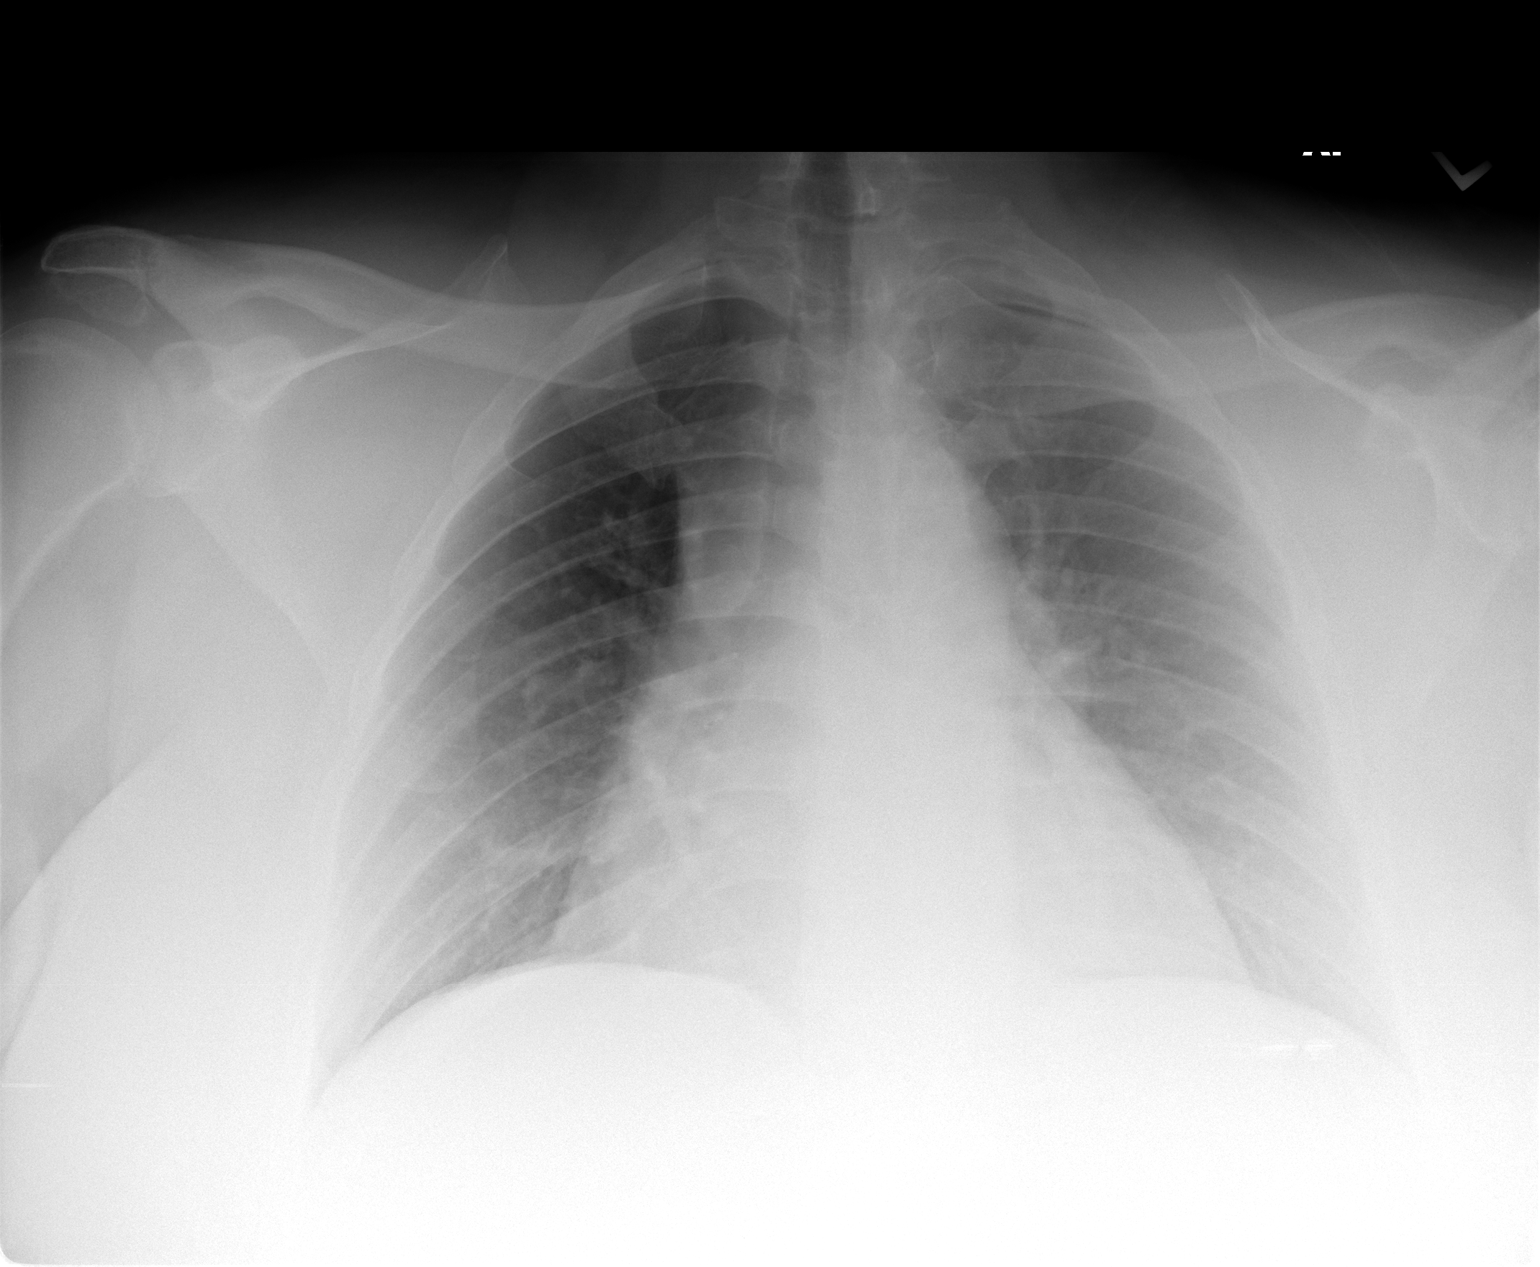

[1 of 1 positions shown; findings below may reference images not displayed]

FINDINGS: Minimal atelectasis in the right lower lung.  The heart
is moderately enlarged.  No edema or infiltrates.  No pleural
effusions.
IMPRESSION: Cardiomegaly and right basilar atelectasis.

## 2009-10-16 ENCOUNTER — Emergency Department (HOSPITAL_COMMUNITY): Admission: EM | Admit: 2009-10-16 | Discharge: 2009-10-16 | Payer: Self-pay | Admitting: Emergency Medicine

## 2009-10-24 ENCOUNTER — Encounter (HOSPITAL_COMMUNITY): Admission: RE | Admit: 2009-10-24 | Discharge: 2009-11-02 | Payer: Self-pay | Admitting: Internal Medicine

## 2009-10-31 ENCOUNTER — Emergency Department (HOSPITAL_COMMUNITY): Admission: EM | Admit: 2009-10-31 | Discharge: 2009-10-31 | Payer: Self-pay | Admitting: Emergency Medicine

## 2009-11-02 ENCOUNTER — Inpatient Hospital Stay (HOSPITAL_COMMUNITY): Admission: EM | Admit: 2009-11-02 | Discharge: 2009-11-06 | Payer: Self-pay | Admitting: Emergency Medicine

## 2009-11-06 ENCOUNTER — Encounter (HOSPITAL_COMMUNITY): Admission: RE | Admit: 2009-11-06 | Discharge: 2009-12-06 | Payer: Self-pay | Admitting: Internal Medicine

## 2009-11-12 ENCOUNTER — Emergency Department (HOSPITAL_COMMUNITY)
Admission: EM | Admit: 2009-11-12 | Discharge: 2009-11-12 | Payer: Self-pay | Source: Home / Self Care | Admitting: Emergency Medicine

## 2009-12-19 ENCOUNTER — Encounter (HOSPITAL_COMMUNITY)
Admission: RE | Admit: 2009-12-19 | Discharge: 2010-01-18 | Payer: Self-pay | Source: Home / Self Care | Attending: Internal Medicine | Admitting: Internal Medicine

## 2009-12-22 IMAGING — CT CT ABDOMEN W/O CM
2 of 4 series · 17 of 46 positions shown, 19 images · non-contrast
Comparison: 10/09/2008

CT ABDOMEN

CLINICAL DATA: Abdominal pain, hypertension, gout

CT ABDOMEN AND PELVIS WITHOUT CONTRAST
TECHNIQUE: Multidetector CT imaging of the abdomen and pelvis was
performed following the standard protocol without intravenous
contrast. Water administered as oral contrast.
IV contrast not administered due to renal dysfunction, creatinine
1.83. Breast shield utilized.  Sagittal and coronal MPR images
reconstructed from axial data set.

[Series 2: abd|pel w/o 5.0 b40f · axial · non-contrast · 0.91mm/px · z∈[-444,+16]mm · 14 of 101 slices shown, 16 images]
[im 5/101  soft-tissue]
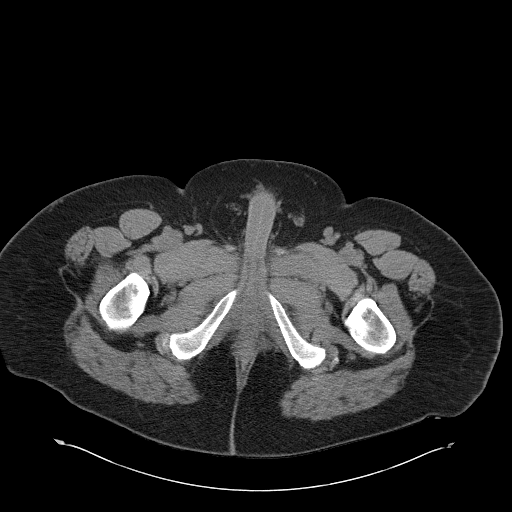
[im 5/101  bone]
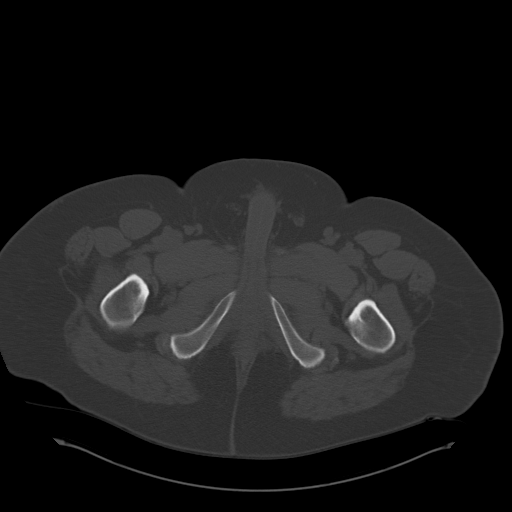
[im 13/101  soft-tissue]
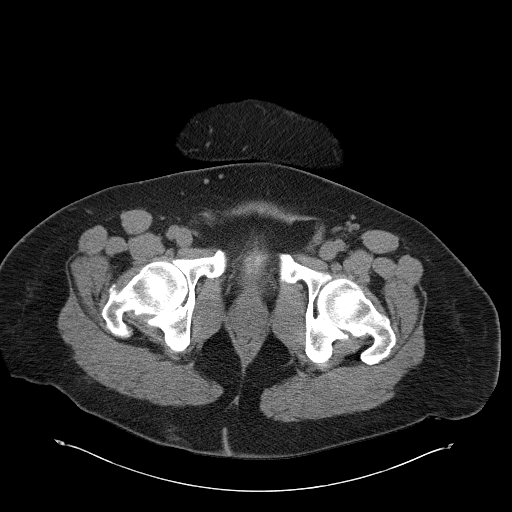
[im 21/101  soft-tissue]
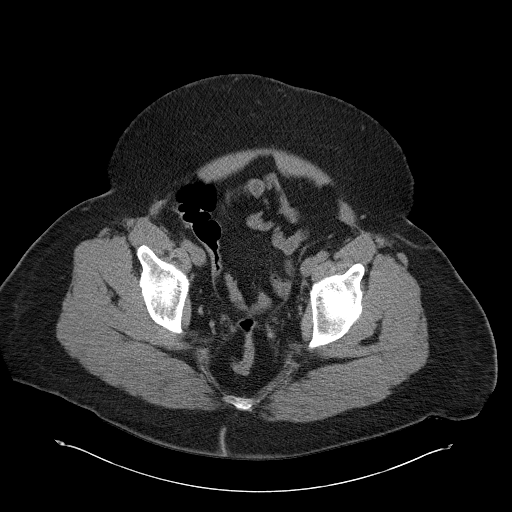
[im 29/101  soft-tissue]
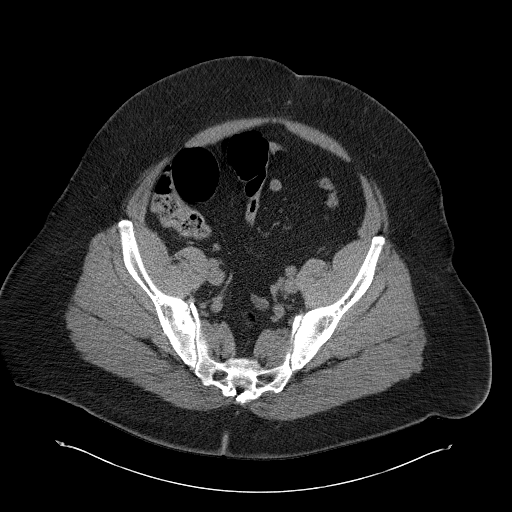
[im 33/101  soft-tissue]
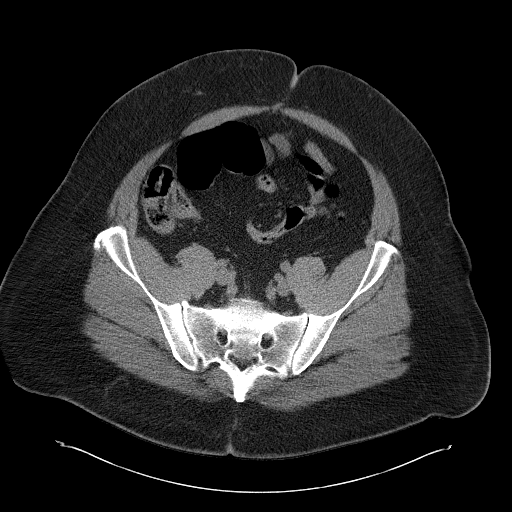
[im 41/101  soft-tissue]
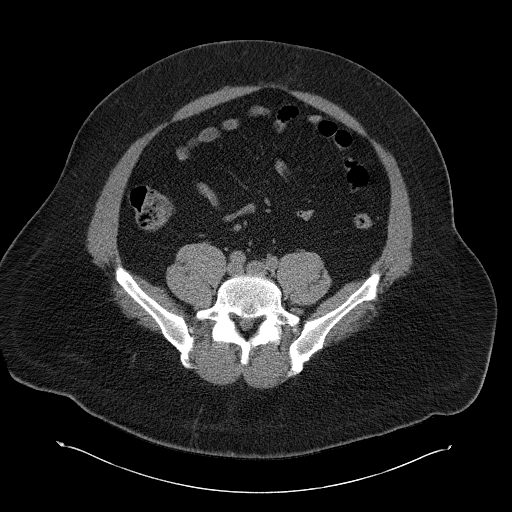
[im 49/101  soft-tissue]
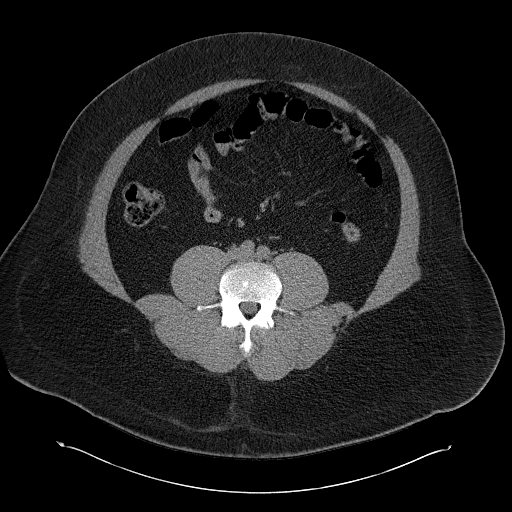
[im 53/101  soft-tissue]
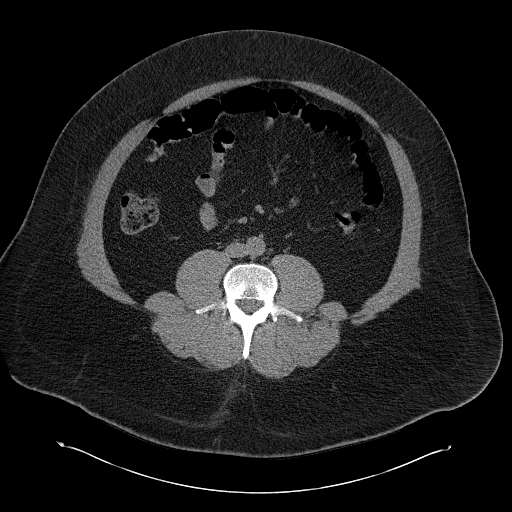
[im 61/101  soft-tissue]
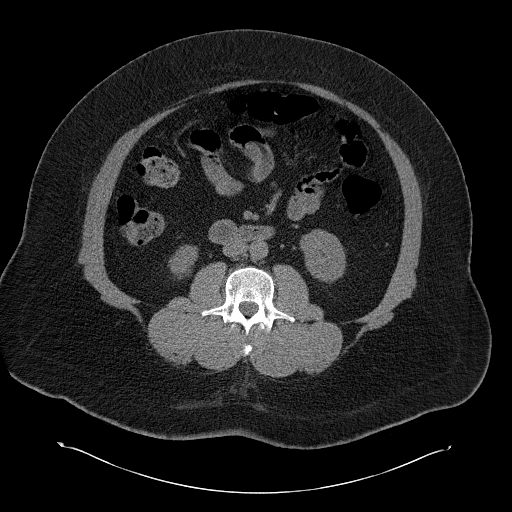
[im 61/101  bone]
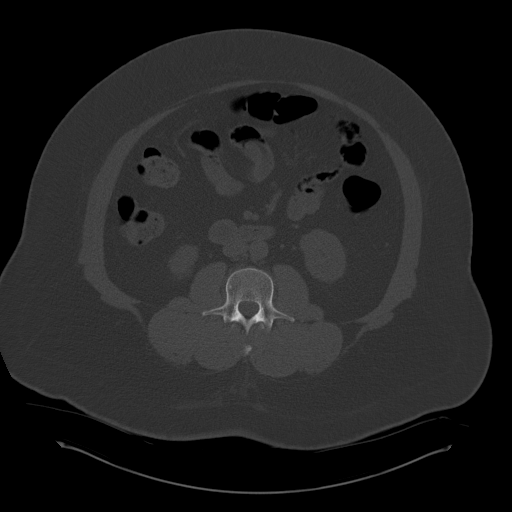
[im 69/101  soft-tissue]
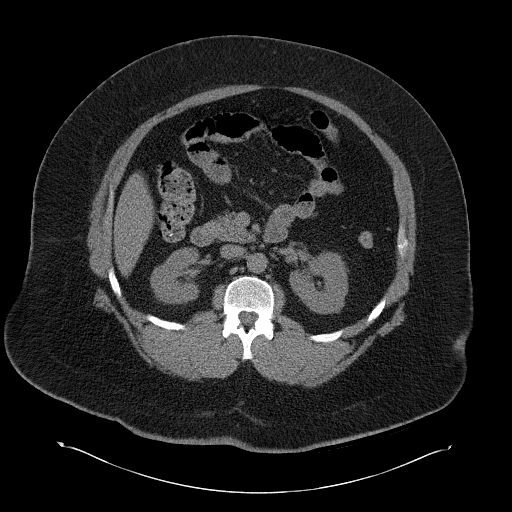
[im 77/101  soft-tissue]
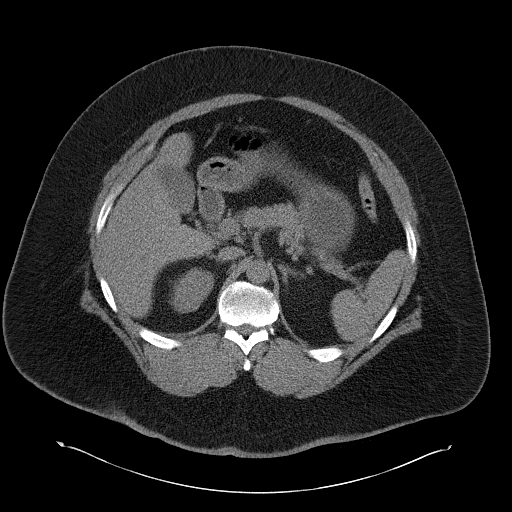
[im 81/101  soft-tissue]
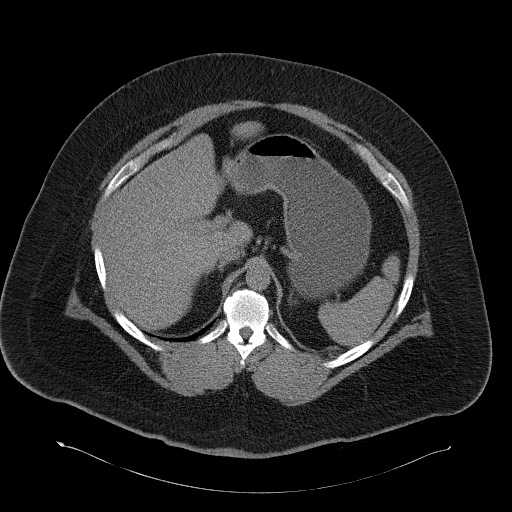
[im 89/101  soft-tissue]
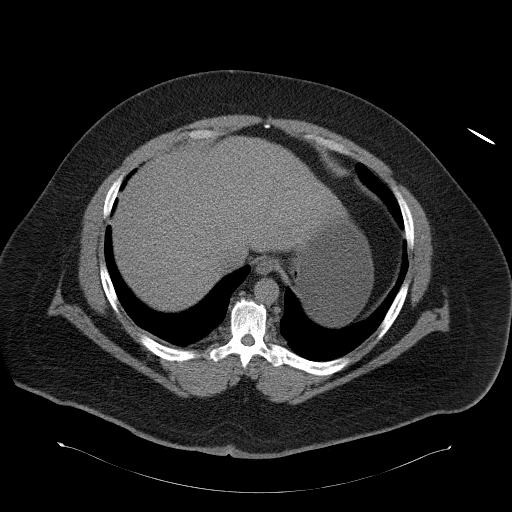
[im 97/101  soft-tissue]
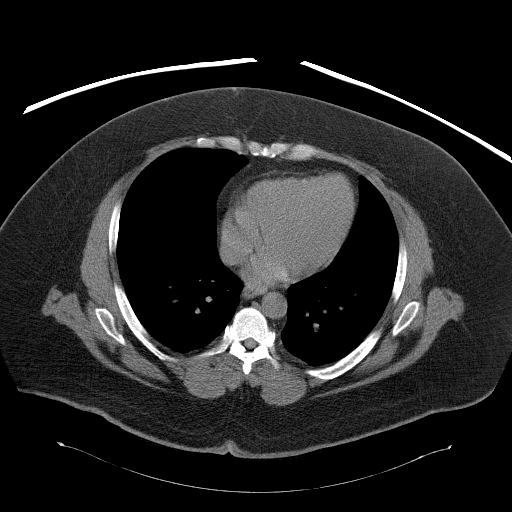

[Series 4: mpr cor (id) · coronal · 0.83mm/px · 3 of 100 slices shown]
[im 34/100  soft-tissue]
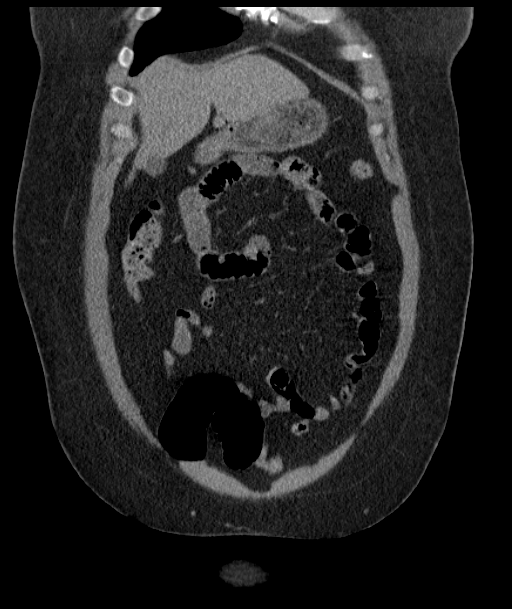
[im 45/100  soft-tissue]
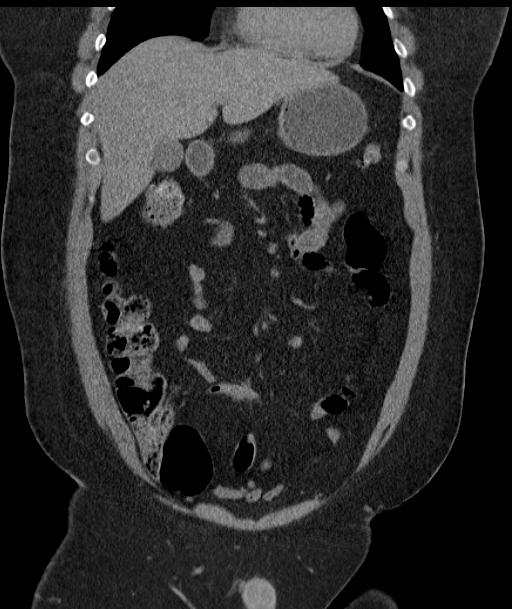
[im 56/100  soft-tissue]
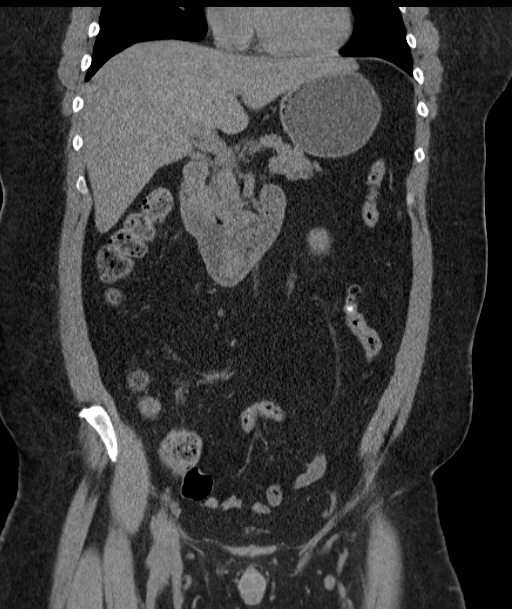

[17 of 46 positions shown; findings below may reference images not displayed]

FINDINGS: Lung bases clear.
Solid organs and bowel loops in upper abdomen unremarkable for exam
lacking IV and oral contrast.
No mass, adenopathy, free fluid, or inflammatory process.
IMPRESSION: No acute upper abdominal abnormalities.

CT PELVIS
FINDINGS: Minimally distended bladder, unremarkable.
Small bilateral pelvic phleboliths.
Normal appendix.
Scattered sigmoid and distal descending colonic diverticula without
evidence of diverticulitis.
No mass, adenopathy, free fluid, or inflammatory process.
Bones unremarkable.
IMPRESSION: Scattered sigmoid and distal descending colonic diverticulosis
without evidence of diverticulitis.
No acute intrapelvic abnormalities.

## 2009-12-24 IMAGING — CR DG KNEE 1-2V PORT*R*
2 series · 2 of 2 positions shown · non-contrast
Comparison: 12/24/2007

CLINICAL DATA: Scout

PORTABLE RIGHT KNEE - 1-2 VIEW

[view not recorded (1 of 2)]
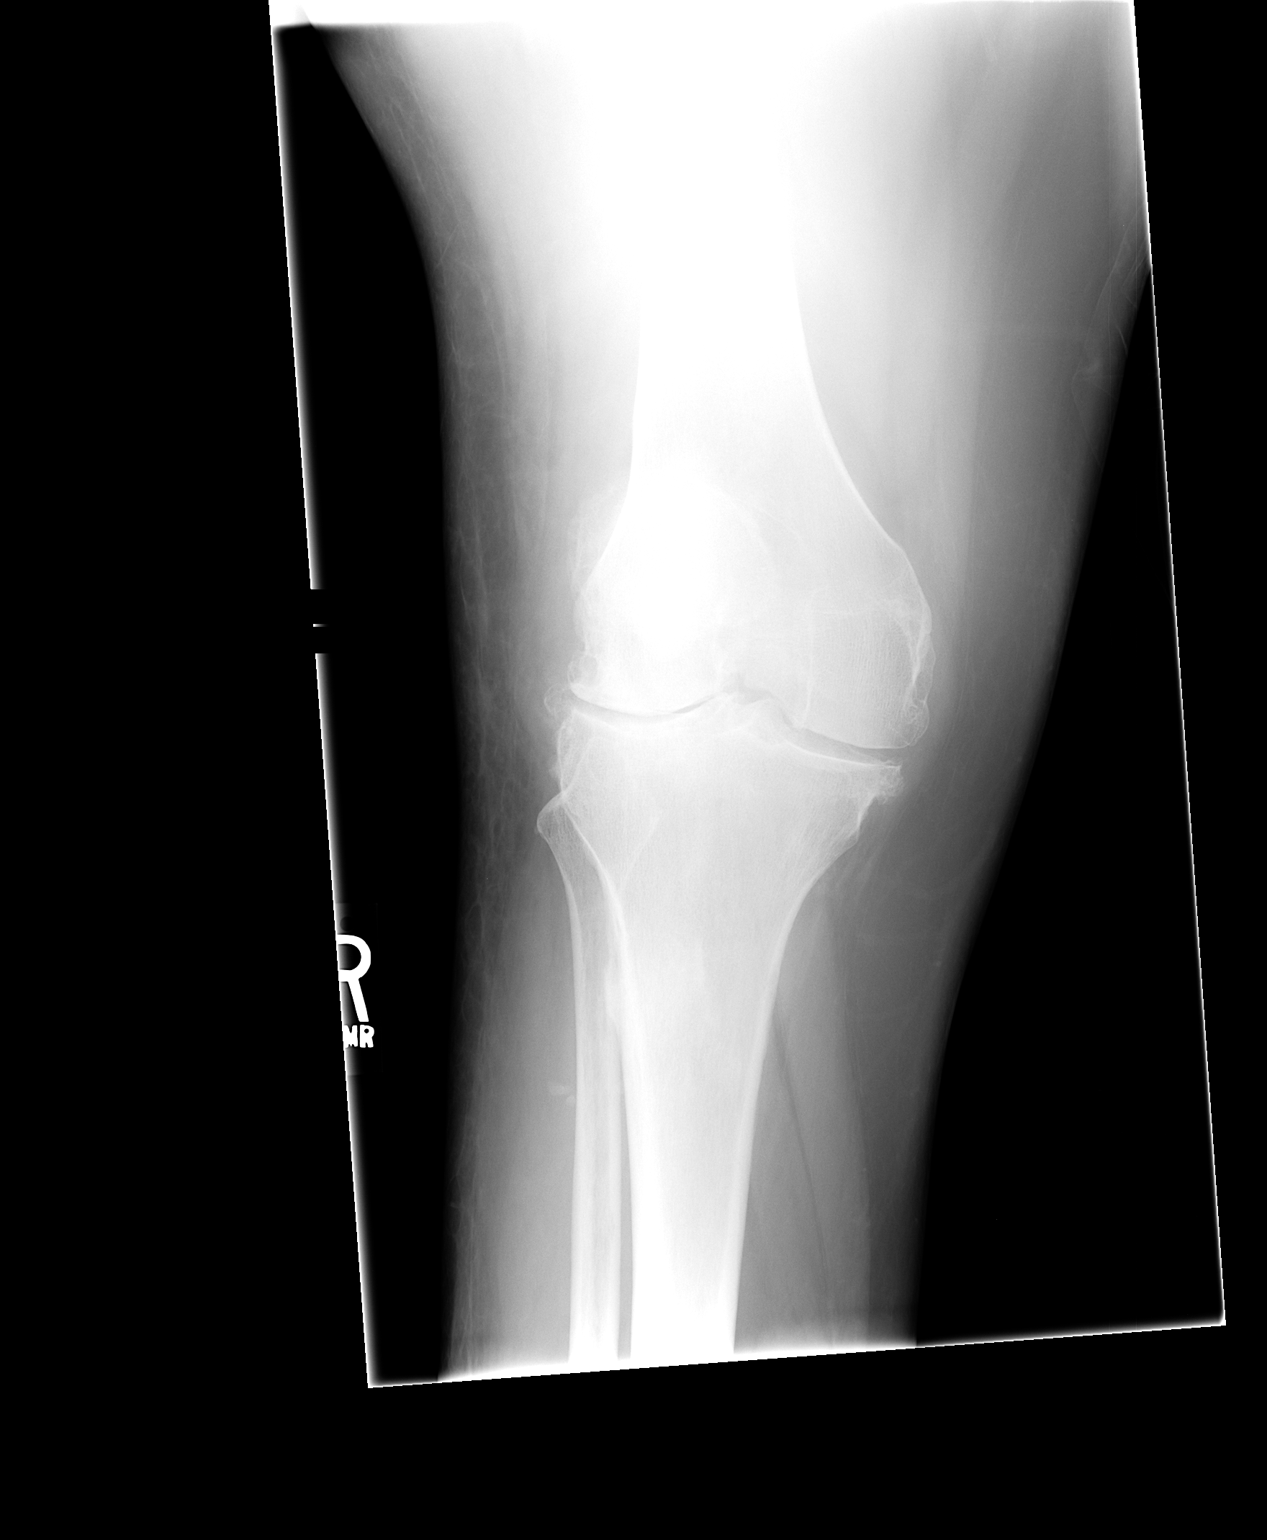

[view not recorded (2 of 2)]
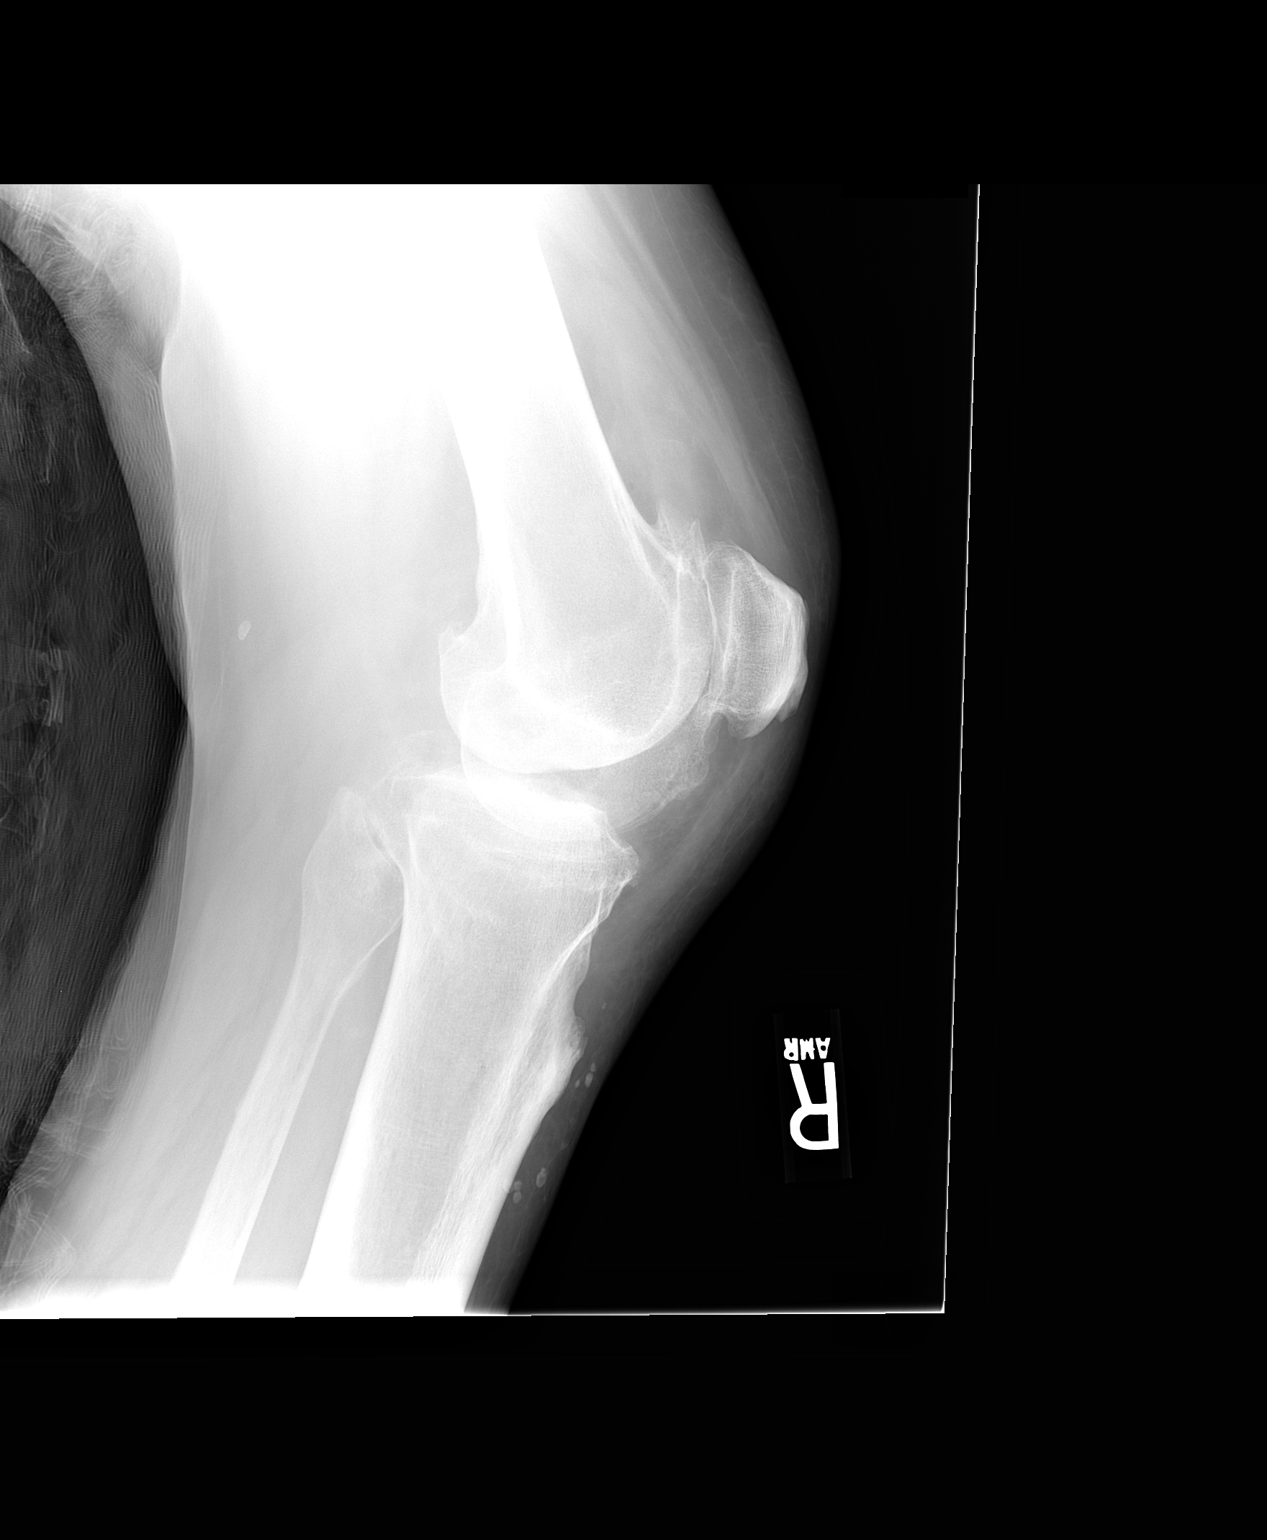

[2 of 2 positions shown; findings below may reference images not displayed]

FINDINGS: Severe tricompartment osteoarthritic change.  No acute
fracture or dislocation.  Moderate joint effusion.
IMPRESSION: No acute bony pathology.  Degenerative change.  Joint effusion.

## 2009-12-25 IMAGING — CR DG CHEST 1V PORT
1 series · 1 of 1 positions shown · non-contrast
Comparison: 04/01/2008

CLINICAL DATA: Right-sided PICC line placement

PORTABLE CHEST - 1 VIEW

[view not recorded]
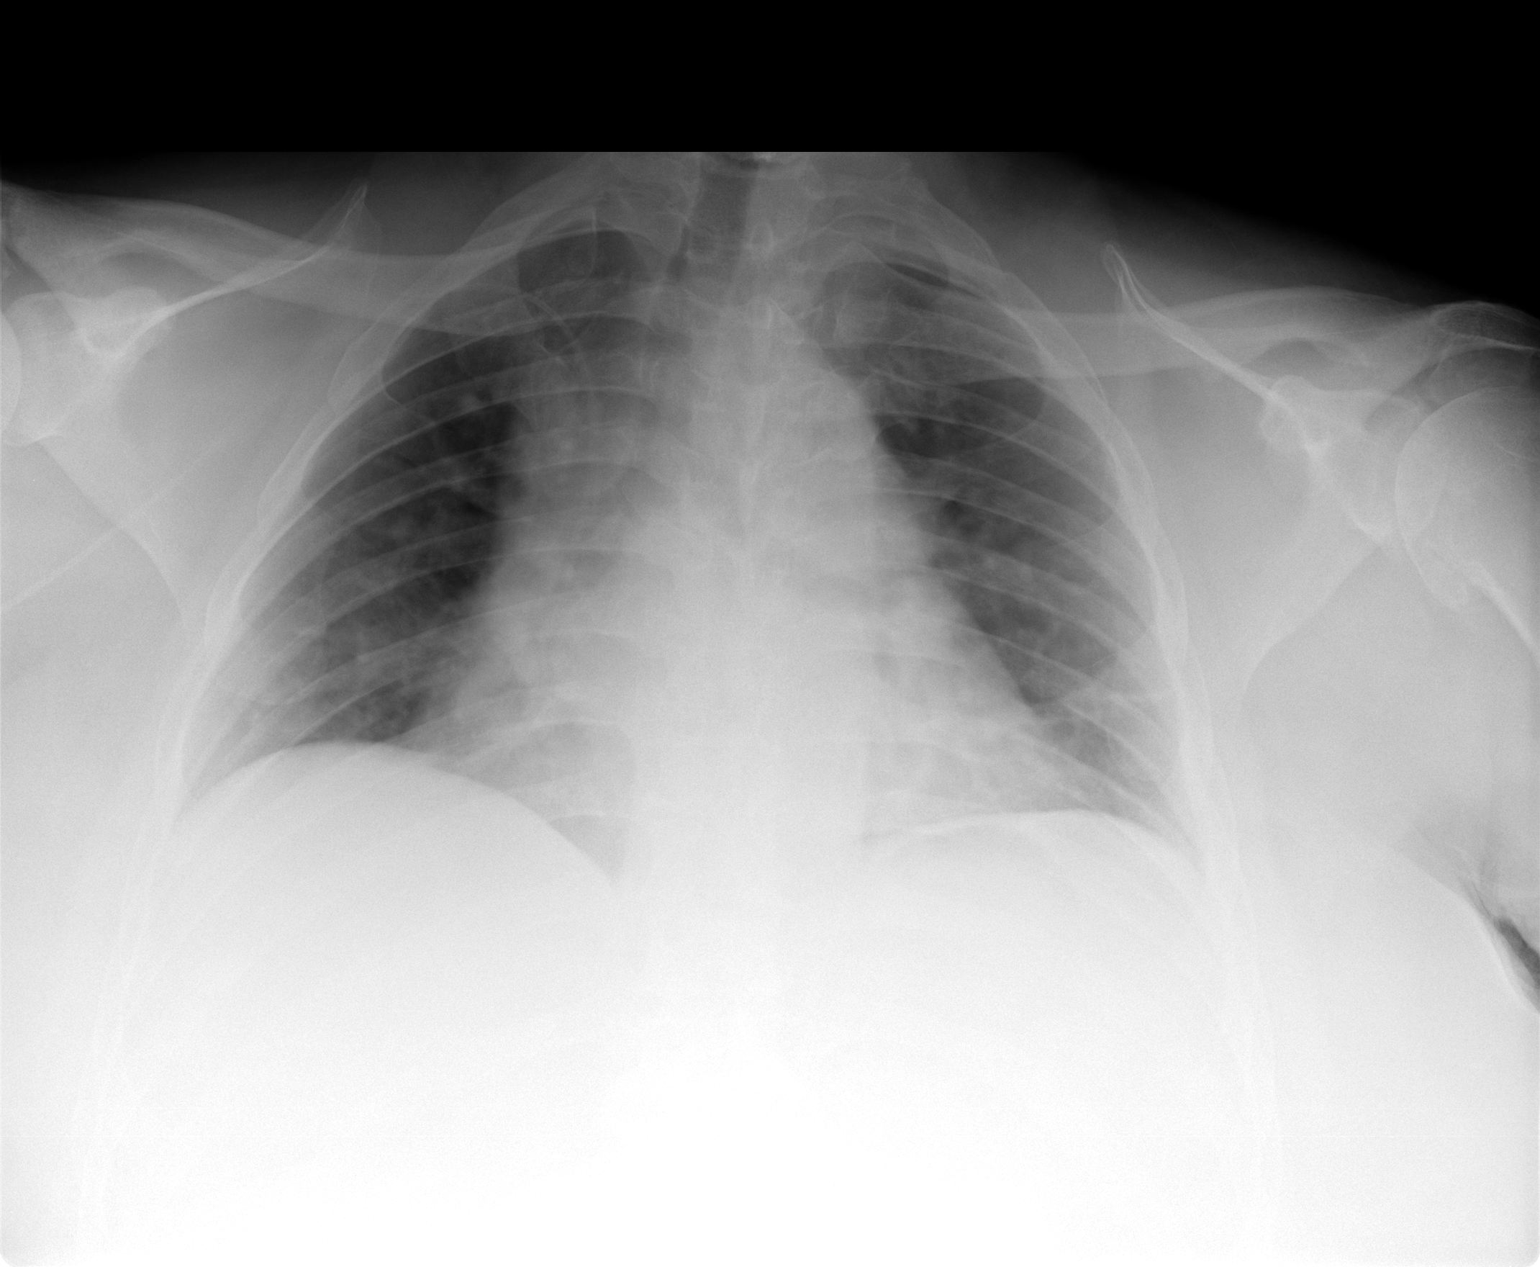

[1 of 1 positions shown; findings below may reference images not displayed]

FINDINGS: There is mild cardiac enlargement.

There is no pleural effusion or pulmonary interstitial edema.

Right arm PICC line tip is in the SVC.

There is plate-like atelectasis noted in the left base.
IMPRESSION: 1.  Right arm PICC line tip in the SVC.
2.  Left base atelectasis

## 2010-01-18 ENCOUNTER — Emergency Department (HOSPITAL_COMMUNITY)
Admission: EM | Admit: 2010-01-18 | Discharge: 2010-01-18 | Payer: Self-pay | Source: Home / Self Care | Admitting: Emergency Medicine

## 2010-01-22 ENCOUNTER — Encounter (HOSPITAL_COMMUNITY)
Admission: RE | Admit: 2010-01-22 | Discharge: 2010-02-21 | Payer: Self-pay | Source: Home / Self Care | Attending: Internal Medicine | Admitting: Internal Medicine

## 2010-01-27 IMAGING — CR DG ABDOMEN ACUTE W/ 1V CHEST
5 series · 5 of 5 positions shown · non-contrast
Comparison: 06/20/2008

CLINICAL DATA: Lower abdominal pain

ACUTE ABDOMEN SERIES (ABDOMEN 2 VIEW & CHEST 1 VIEW)

[view not recorded (1 of 5)]
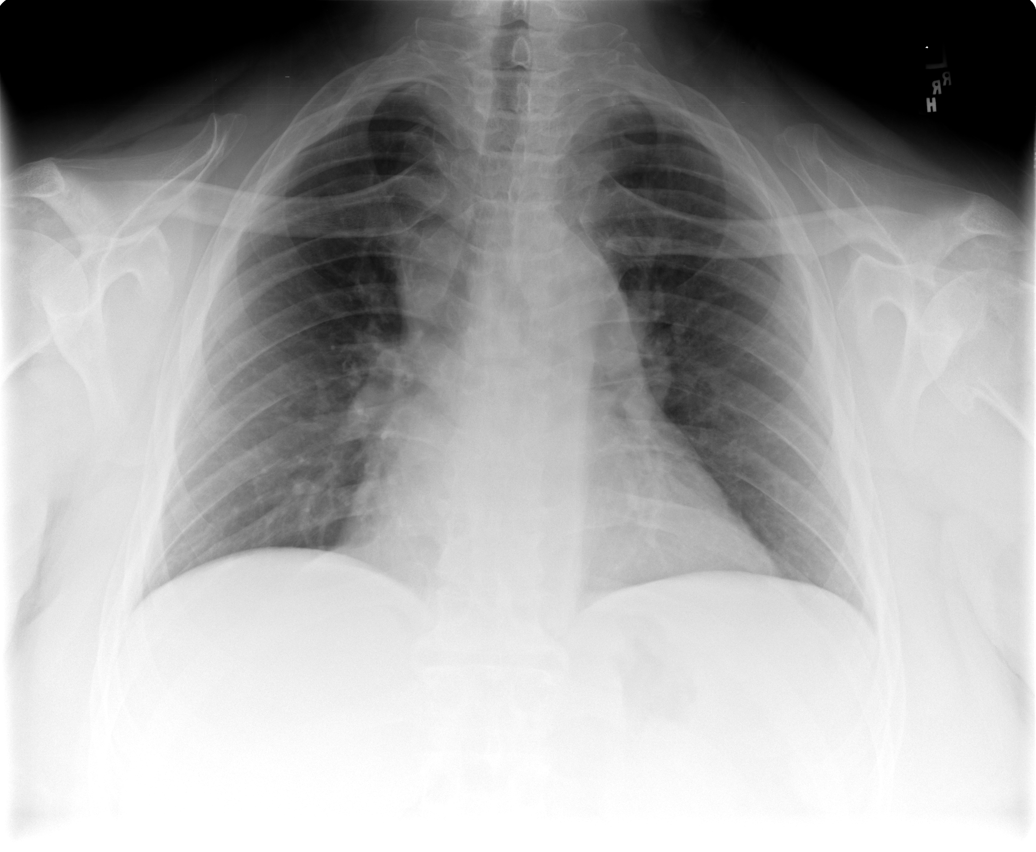

[view not recorded (2 of 5)]
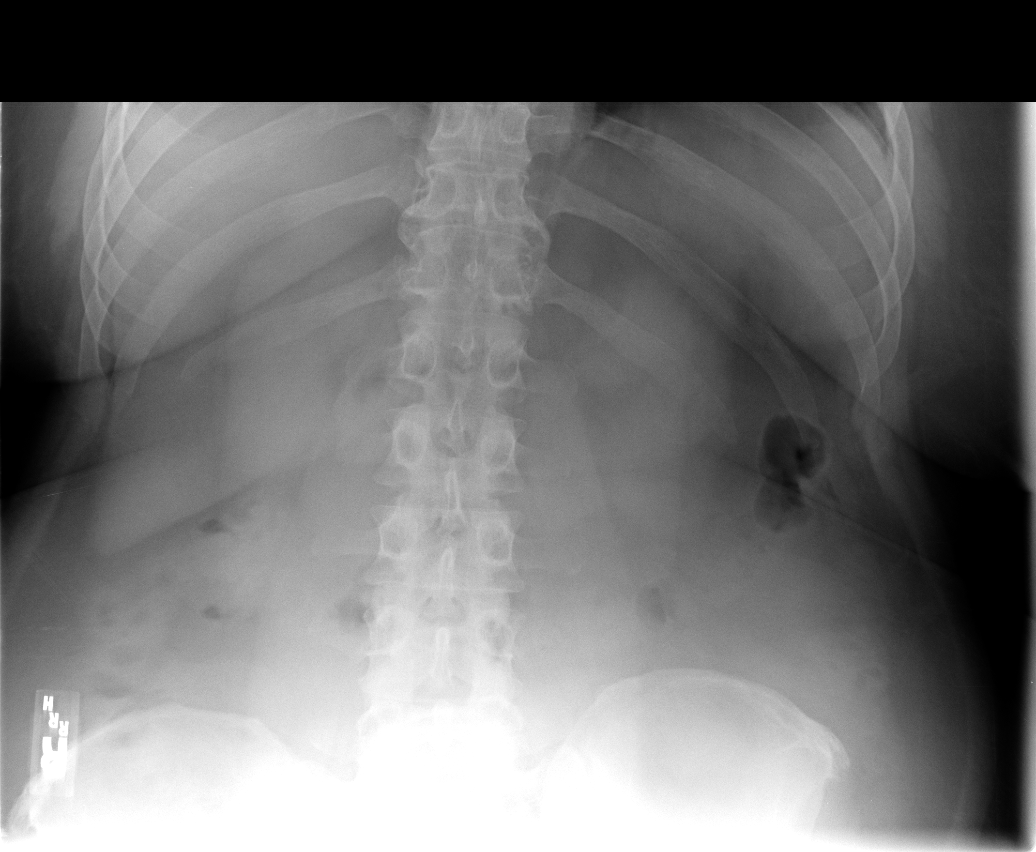

[view not recorded (3 of 5)]
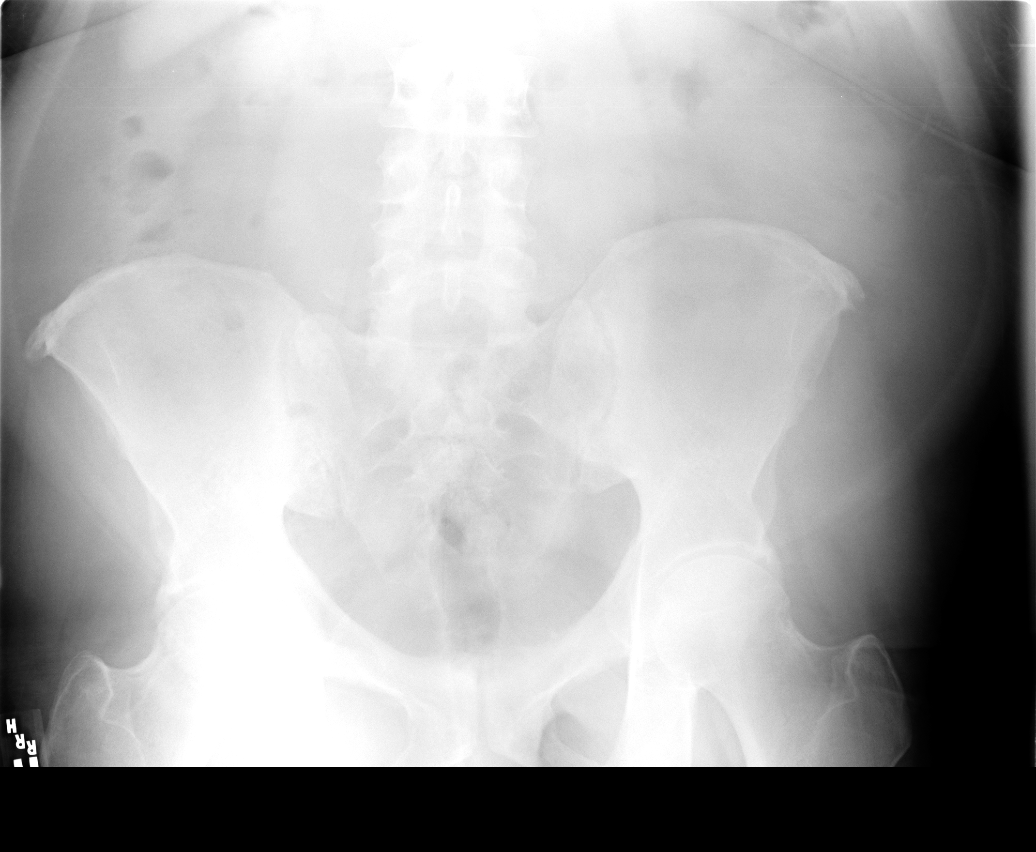

[view not recorded (4 of 5)]
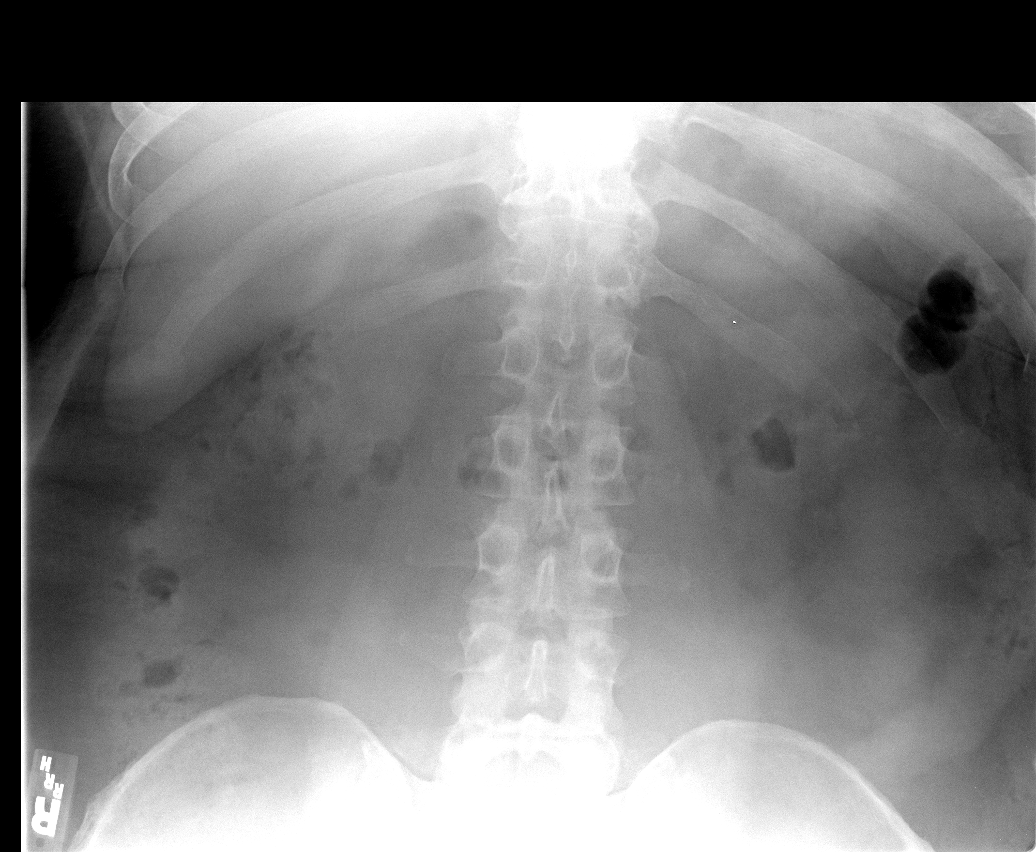

[view not recorded (5 of 5)]
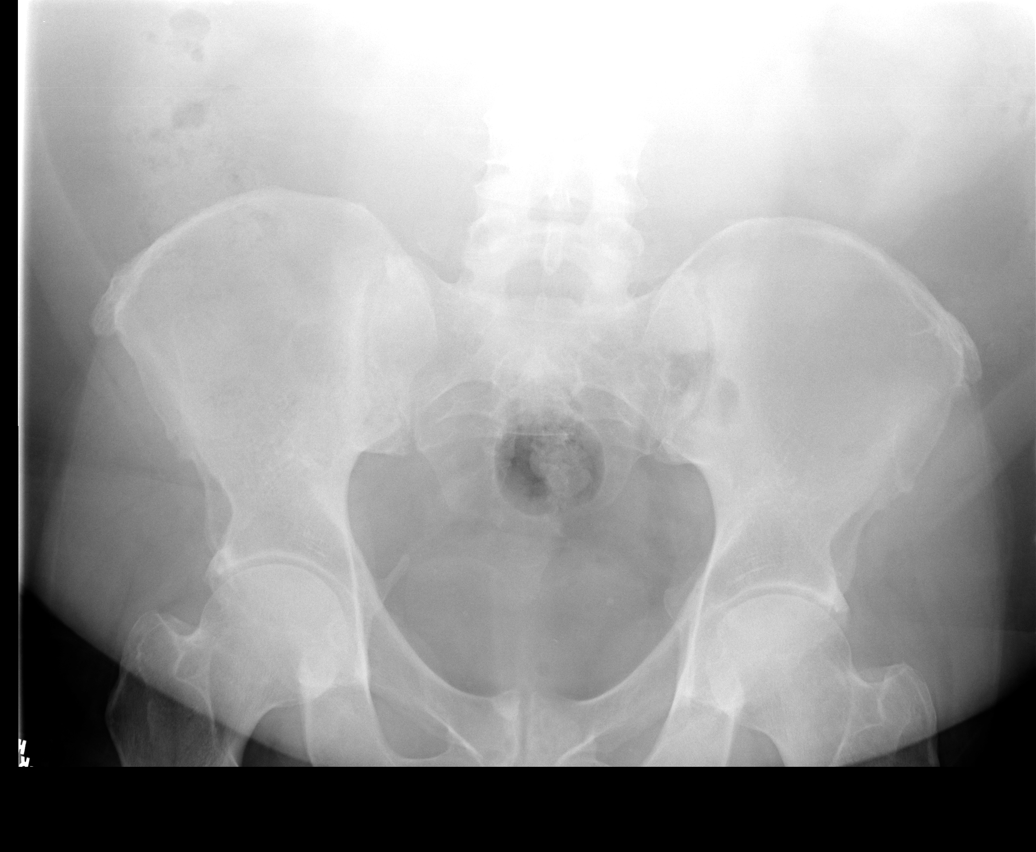

[5 of 5 positions shown; findings below may reference images not displayed]

FINDINGS: Cardiomediastinal silhouette is unremarkable.  No acute
infiltrate or pleural effusion.  No pulmonary edema.

There is a nonspecific nonobstructive bowel gas pattern.  No free
abdominal air.  No pathologic calcifications are noted.
IMPRESSION: No active disease.  Nonspecific nonobstructive bowel gas pattern.

## 2010-01-27 IMAGING — CT CT PELVIS W/O CM
2 of 4 series · 16 of 46 positions shown, 18 images · non-contrast
Comparison: 06/20/2008

CT ABDOMEN

CLINICAL DATA: *Abdominal pain;

CT ABDOMEN AND PELVIS
TECHNIQUE: Multidetector CT imaging of the abdomen was performed
following the standard protocol without IV contrast.,Technique:
Multidetector CT imaging of the pelvis was performed following the
standard protocol without intravenous contrast.

[Series 2: abd|pel w/o 5.0 b40f · axial · non-contrast · 0.87mm/px · z∈[+678,+1138]mm · 13 of 100 slices shown, 15 images]
[im 4/100  soft-tissue]
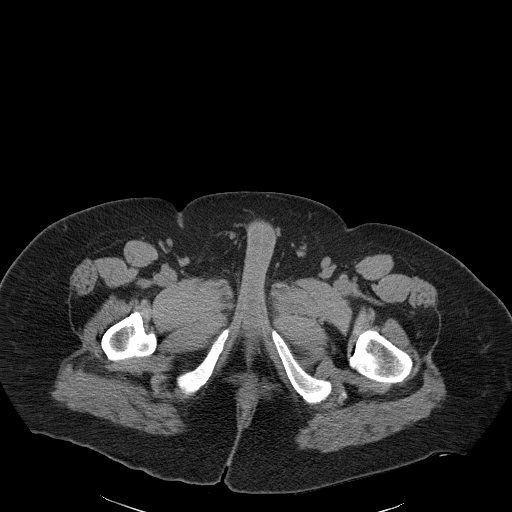
[im 4/100  bone]
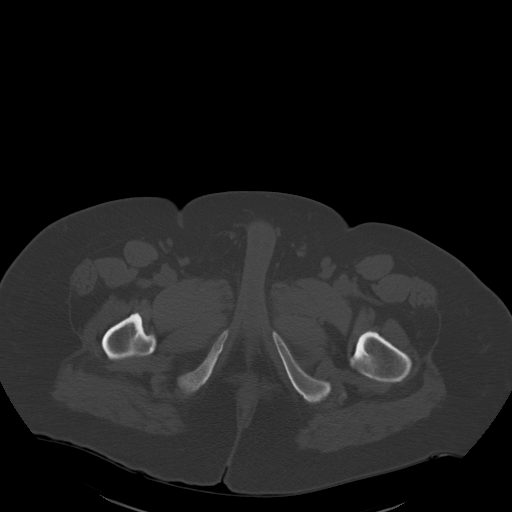
[im 12/100  soft-tissue]
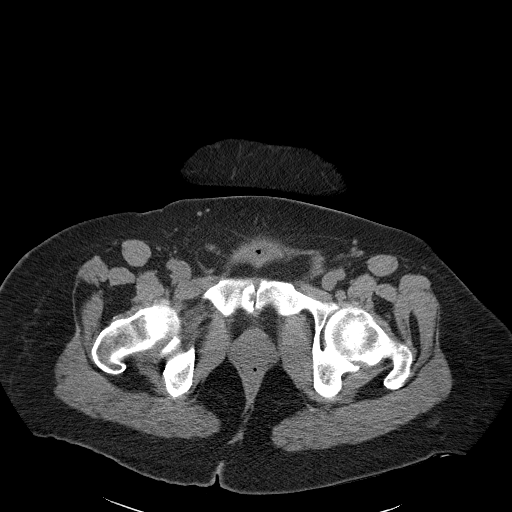
[im 20/100  soft-tissue]
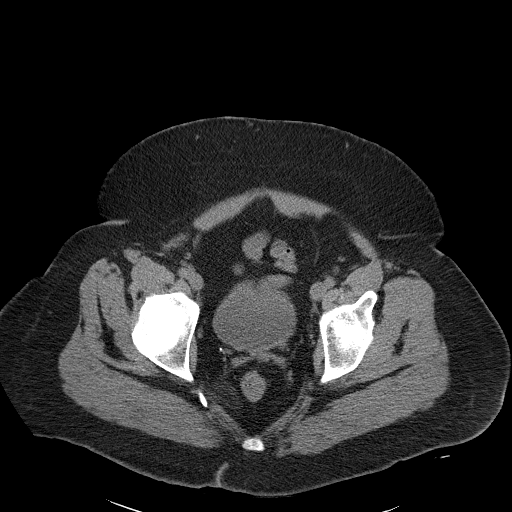
[im 27/100  soft-tissue]
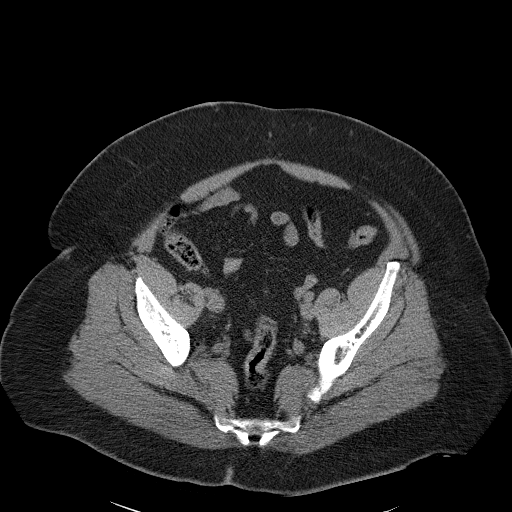
[im 35/100  soft-tissue]
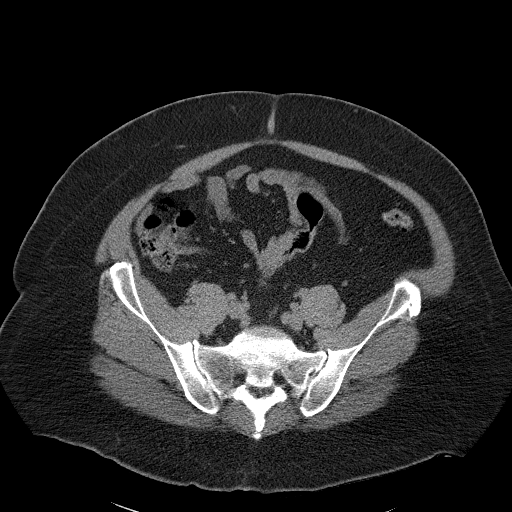
[im 42/100  soft-tissue]
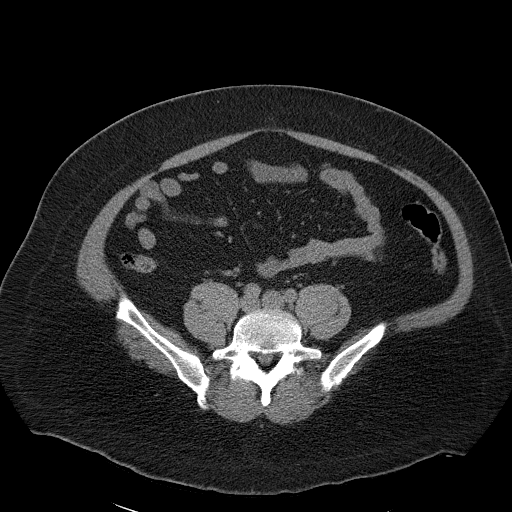
[im 50/100  soft-tissue]
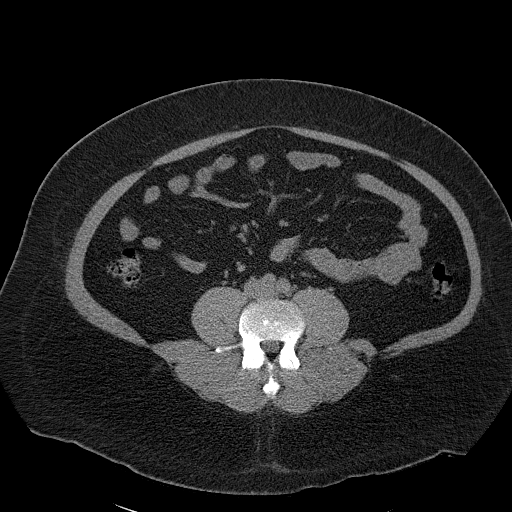
[im 58/100  soft-tissue]
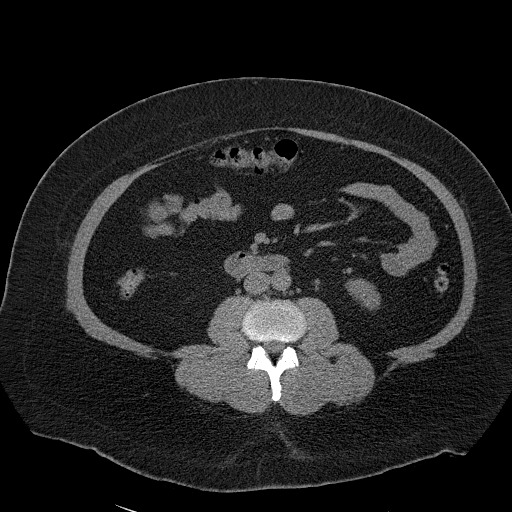
[im 65/100  soft-tissue]
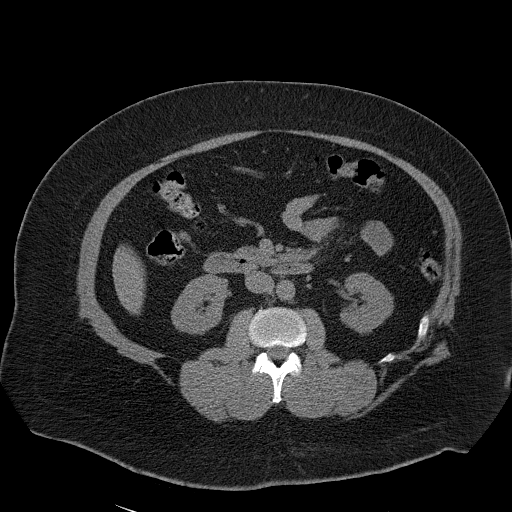
[im 65/100  bone]
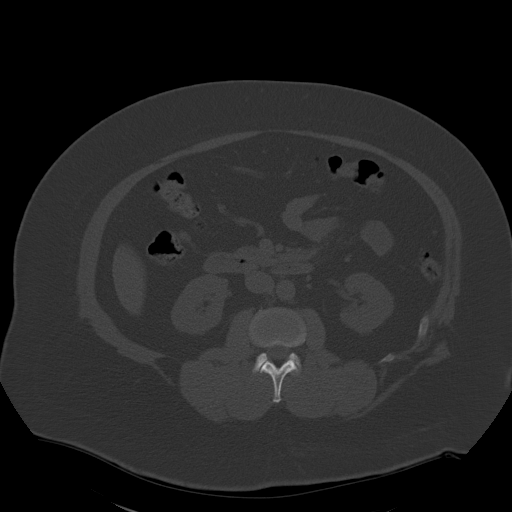
[im 73/100  soft-tissue]
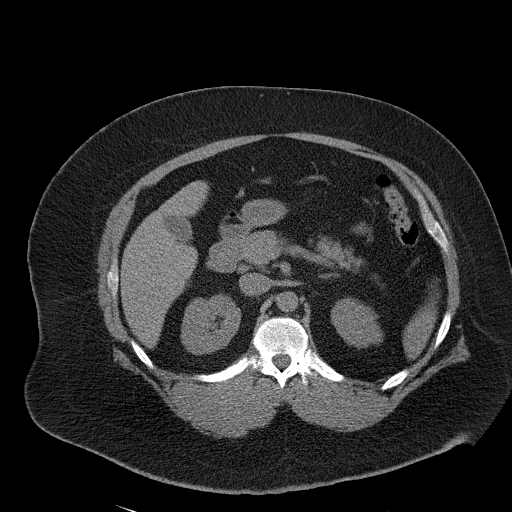
[im 80/100  soft-tissue]
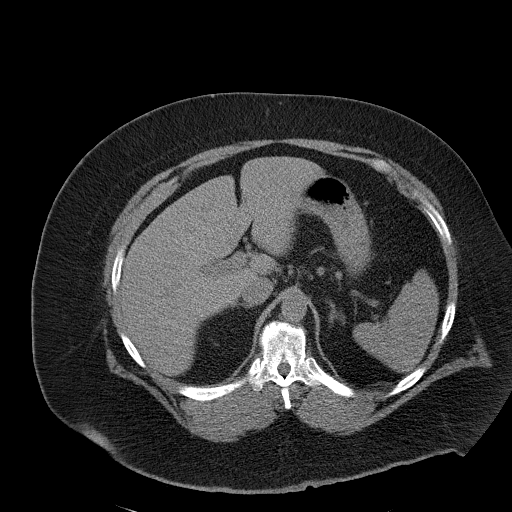
[im 88/100  soft-tissue]
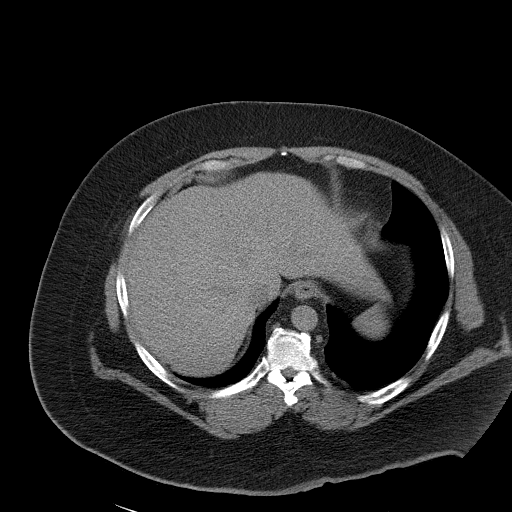
[im 96/100  soft-tissue]
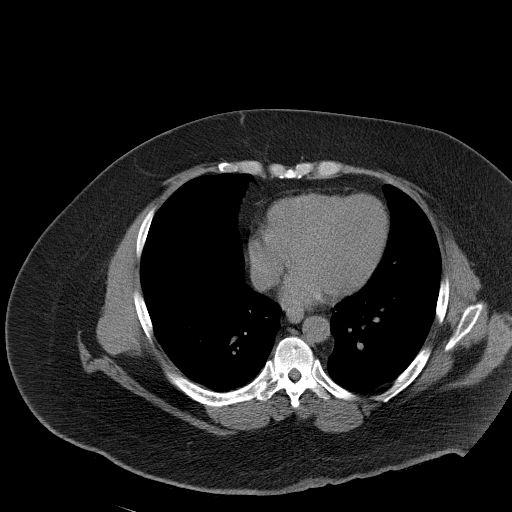

[Series 4: mpr cor (id) · coronal · 0.98mm/px · 3 of 114 slices shown]
[im 38/114  soft-tissue]
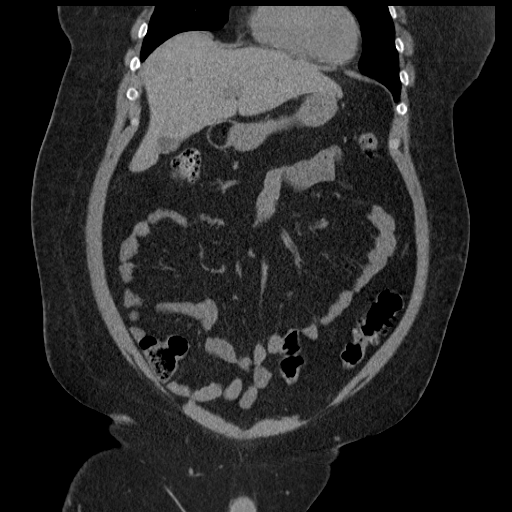
[im 51/114  soft-tissue]
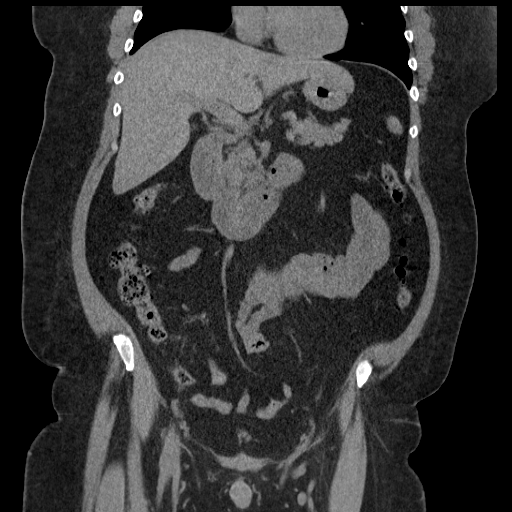
[im 63/114  soft-tissue]
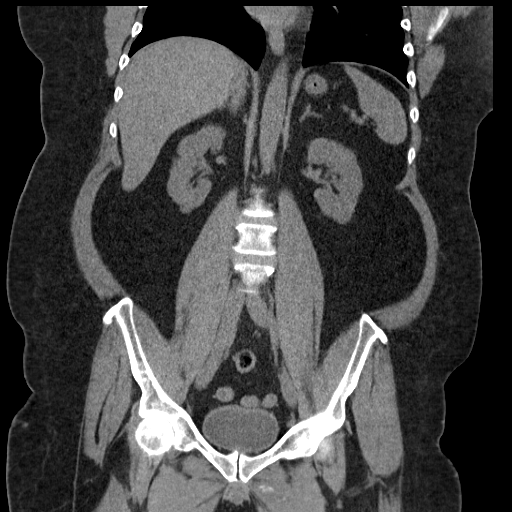

[16 of 46 positions shown; findings below may reference images not displayed]

FINDINGS: Lung bases are unremarkable.  Unenhanced liver, spleen,
pancreas and adrenals are unremarkable.  No calcified gallstones
are noted within gallbladder.

Unenhanced kidney are symmetrical in size.  No nephrolithiasis.  No
hydronephrosis or hydroureter.

No destructive bony lesions are noted.

No bowel obstruction.  No aortic aneurysm.  No ascites or free air.
No adenopathy.

Mild degenerative changes lumbar spine.
IMPRESSION: 1.  No nephrolithiasis.  No hydronephrosis or hydroureter.
2.  No bowel obstruction.  No ascites or free air.

CT PELVIS
FINDINGS: The urinary bladder is unremarkable.  Bilateral distal
ureter is unremarkable.

In axial image 87 there is a midline anterior pelvic wall fluid
collection with a tiny amount of air.  Measures 2.3 x 2.5 cm.  This
is anterior to urinary bladder and is best seen in the sagittal
image 71.  This may represent infected urachal cyst or urachal
remnant.  Less likely complication of the suprapubic bladder
drainage.  I checked with emergency room and the patient did not
have any recent procedure within pelvis.

There is a small right inguinal scrotal canal hernia containing fat
without evidence of acute complication.  No pericecal inflammation.
No pelvic ascites or adenopathy.  A normal appendix is clearly
visualized in axial image 73
IMPRESSION: 1.  There is a small fluid collection midline anterior pelvic wall
with small amount of air.  This is anterior urinary bladder.  This
may represent infected urachal cyst or urachal remnant.  Clinical
correlation is necessary.
2.  Unremarkable bladder and distal ureters bilaterally.
3.  Normal appendix.

## 2010-01-29 ENCOUNTER — Ambulatory Visit: Payer: Self-pay | Admitting: Vascular Surgery

## 2010-02-21 ENCOUNTER — Emergency Department (HOSPITAL_COMMUNITY)
Admission: EM | Admit: 2010-02-21 | Discharge: 2010-02-21 | Payer: Self-pay | Source: Home / Self Care | Admitting: Emergency Medicine

## 2010-02-26 ENCOUNTER — Encounter (HOSPITAL_COMMUNITY)
Admission: RE | Admit: 2010-02-26 | Discharge: 2010-03-05 | Payer: Self-pay | Source: Home / Self Care | Attending: Internal Medicine | Admitting: Internal Medicine

## 2010-03-05 NOTE — Letter (Signed)
Summary: [facility]   Imported By: Ihor Austin 02/14/2009 09:43:34  _____________________________________________________________________  External Attachment:    Type:   Image     Comment:   External Document

## 2010-03-05 NOTE — Letter (Signed)
Summary: Hospital consult  Hospital consult   Imported By: Ihor Austin 03/14/2009 18:54:44  _____________________________________________________________________  External Attachment:    Type:   Image     Comment:   External Document

## 2010-03-07 ENCOUNTER — Ambulatory Visit (HOSPITAL_COMMUNITY)
Admission: RE | Admit: 2010-03-07 | Discharge: 2010-03-07 | Disposition: A | Discharge status: Home / Self Care | Payer: BC Managed Care – PPO | Source: Ambulatory Visit | Attending: Internal Medicine | Admitting: Internal Medicine

## 2010-03-07 DIAGNOSIS — M6281 Muscle weakness (generalized): Secondary | ICD-10-CM | POA: Insufficient documentation

## 2010-03-07 DIAGNOSIS — R262 Difficulty in walking, not elsewhere classified: Secondary | ICD-10-CM | POA: Insufficient documentation

## 2010-03-07 DIAGNOSIS — I10 Essential (primary) hypertension: Secondary | ICD-10-CM | POA: Insufficient documentation

## 2010-03-12 ENCOUNTER — Emergency Department (HOSPITAL_COMMUNITY)
Admission: EM | Admit: 2010-03-12 | Discharge: 2010-03-12 | Disposition: A | Discharge status: Home / Self Care | Payer: BC Managed Care – PPO | Attending: Emergency Medicine | Admitting: Emergency Medicine

## 2010-03-12 ENCOUNTER — Ambulatory Visit (HOSPITAL_COMMUNITY)
Admission: RE | Admit: 2010-03-12 | Discharge: 2010-03-12 | Disposition: A | Discharge status: Home / Self Care | Payer: BC Managed Care – PPO | Source: Ambulatory Visit | Attending: Internal Medicine | Admitting: Internal Medicine

## 2010-03-12 DIAGNOSIS — R350 Frequency of micturition: Secondary | ICD-10-CM | POA: Insufficient documentation

## 2010-03-12 DIAGNOSIS — M25629 Stiffness of unspecified elbow, not elsewhere classified: Secondary | ICD-10-CM | POA: Insufficient documentation

## 2010-03-12 DIAGNOSIS — M25619 Stiffness of unspecified shoulder, not elsewhere classified: Secondary | ICD-10-CM | POA: Insufficient documentation

## 2010-03-12 DIAGNOSIS — M6281 Muscle weakness (generalized): Secondary | ICD-10-CM | POA: Insufficient documentation

## 2010-03-12 DIAGNOSIS — M25639 Stiffness of unspecified wrist, not elsewhere classified: Secondary | ICD-10-CM | POA: Insufficient documentation

## 2010-03-12 DIAGNOSIS — N189 Chronic kidney disease, unspecified: Secondary | ICD-10-CM | POA: Insufficient documentation

## 2010-03-12 DIAGNOSIS — E119 Type 2 diabetes mellitus without complications: Secondary | ICD-10-CM | POA: Insufficient documentation

## 2010-03-12 DIAGNOSIS — M7989 Other specified soft tissue disorders: Secondary | ICD-10-CM | POA: Insufficient documentation

## 2010-03-12 DIAGNOSIS — IMO0001 Reserved for inherently not codable concepts without codable children: Secondary | ICD-10-CM | POA: Insufficient documentation

## 2010-03-12 DIAGNOSIS — R609 Edema, unspecified: Secondary | ICD-10-CM | POA: Insufficient documentation

## 2010-03-12 DIAGNOSIS — I129 Hypertensive chronic kidney disease with stage 1 through stage 4 chronic kidney disease, or unspecified chronic kidney disease: Secondary | ICD-10-CM | POA: Insufficient documentation

## 2010-03-12 LAB — URINE MICROSCOPIC-ADD ON

## 2010-03-12 LAB — BASIC METABOLIC PANEL
BUN: 62 mg/dL — ABNORMAL HIGH (ref 6–23)
Calcium: 9.3 mg/dL (ref 8.4–10.5)
GFR calc non Af Amer: 24 mL/min — ABNORMAL LOW (ref >60)
Glucose, Bld: 125 mg/dL — ABNORMAL HIGH (ref 70–99)
Sodium: 138 mEq/L (ref 135–145)

## 2010-03-12 LAB — URINALYSIS, ROUTINE W REFLEX MICROSCOPIC
Bilirubin Urine: NEGATIVE
Leukocytes, UA: NEGATIVE
Nitrite: NEGATIVE
Specific Gravity, Urine: 1.01 (ref 1.005–1.030)
Urine Glucose, Fasting: NEGATIVE mg/dL
pH: 5.5 (ref 5.0–8.0)

## 2010-03-12 LAB — URIC ACID: Uric Acid, Serum: 8.3 mg/dL — ABNORMAL HIGH (ref 4.0–7.8)

## 2010-03-12 IMAGING — CR DG TOE GREAT 2+V*L*
2 series · 2 of 2 positions shown · non-contrast
Comparison: 03/25/2006

CLINICAL DATA: Left great toe pain, ulcer

LEFT TOE - 2+ VIEW

[view not recorded (1 of 2)]
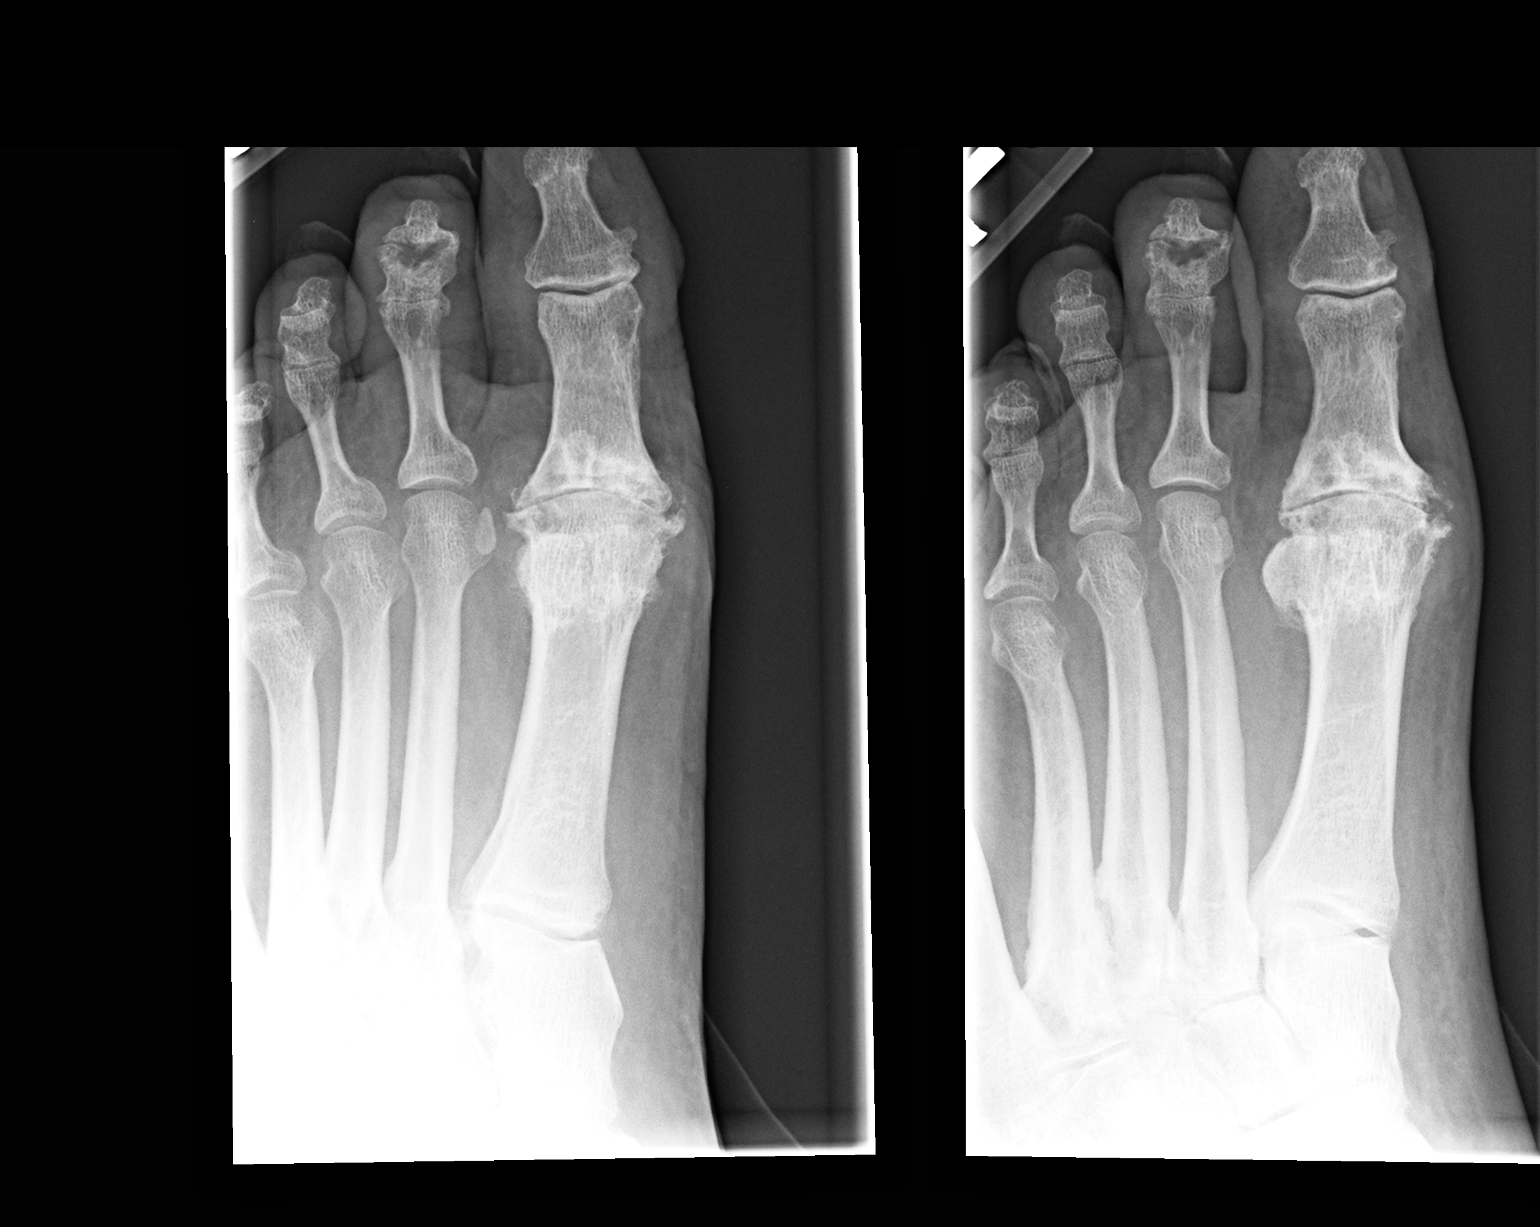

[view not recorded (2 of 2)]
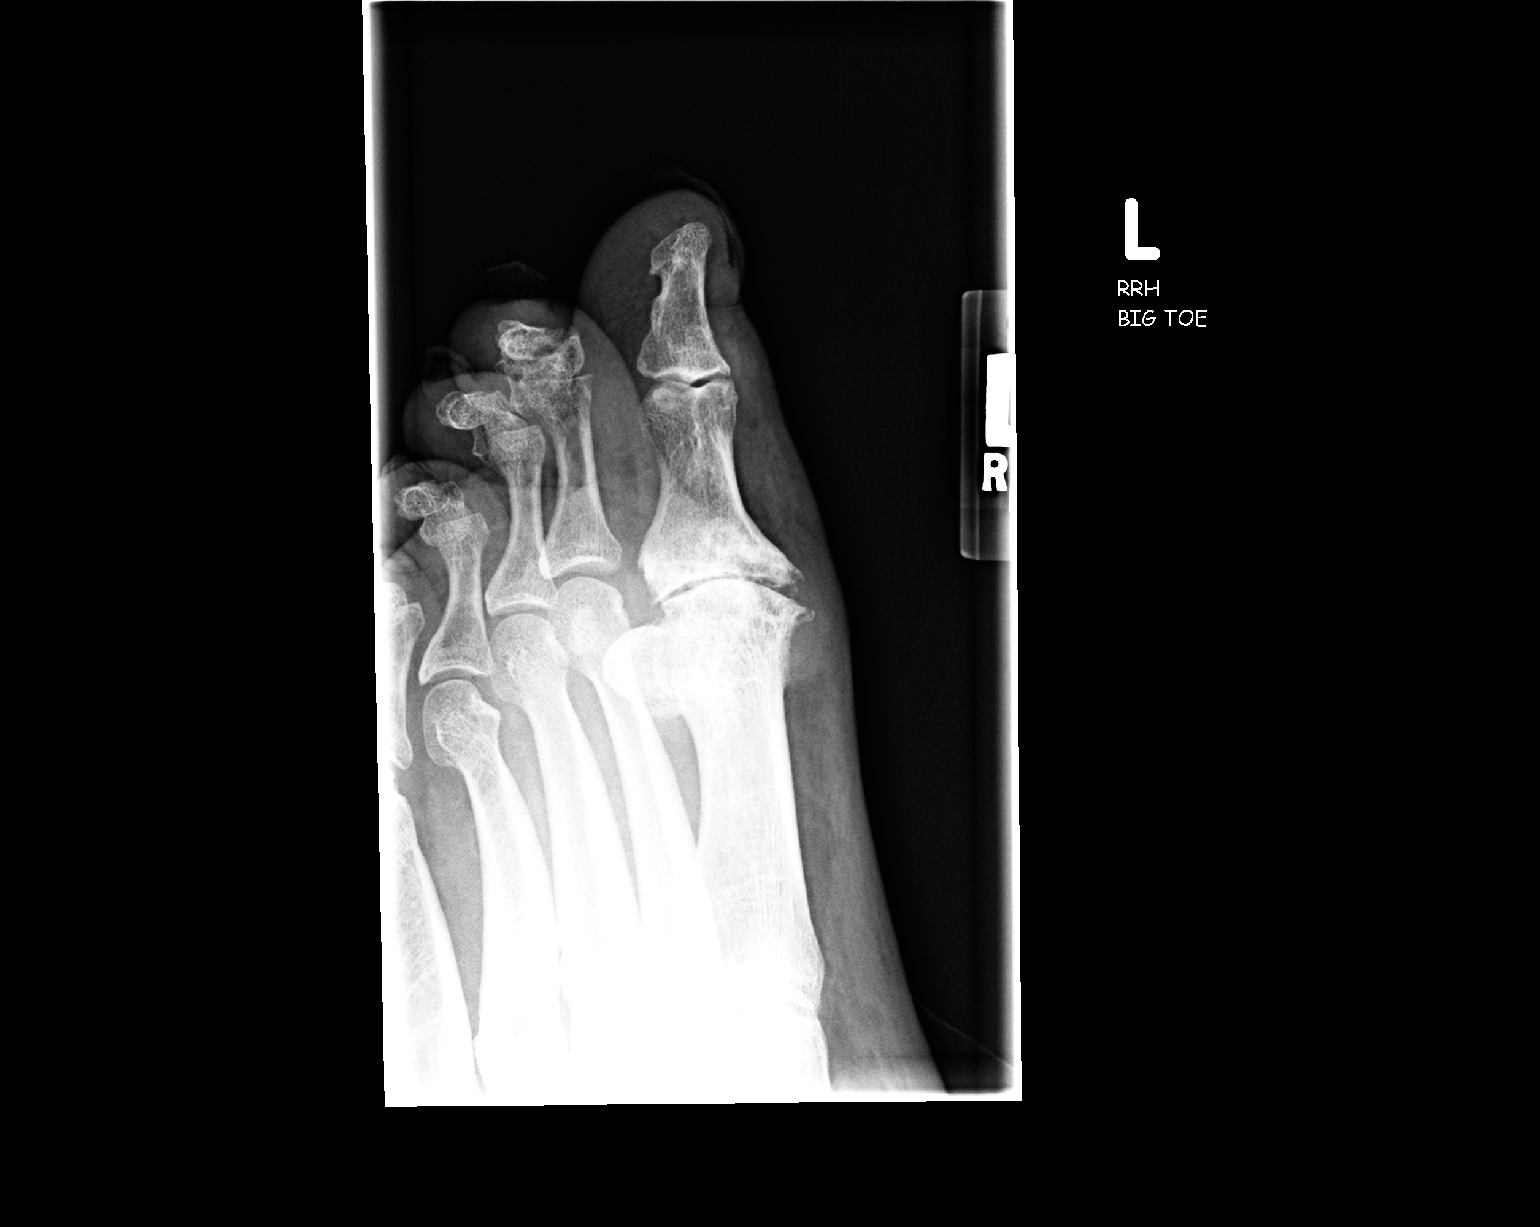

[2 of 2 positions shown; findings below may reference images not displayed]

FINDINGS: Bony demineralization.
Advanced degenerative changes at first MTP joint with joint space
narrowing, spur formation and subchondral cyst formation.
Additional similar changes are seen at the PIP and DIP joints of
the second toe.
No fracture, dislocation, or additional bone destruction.
Questionable tiny foci of soft tissue gas at the great toe though
this could be superimposed artifacts.
IMPRESSION: Degenerative changes of the left great and second toes as above.
Questionable tiny foci of soft tissue gas versus artifacts at the
great toe as above, soft tissue infection/cellulitis/abscess not
excluded.

## 2010-03-12 IMAGING — CR DG FINGER THUMB 2+V*R*
1 series · 1 of 1 positions shown · non-contrast
Comparison: 11/03/2007

CLINICAL DATA: Right thumb pain and swelling

RIGHT THUMB 2+V

[view not recorded]
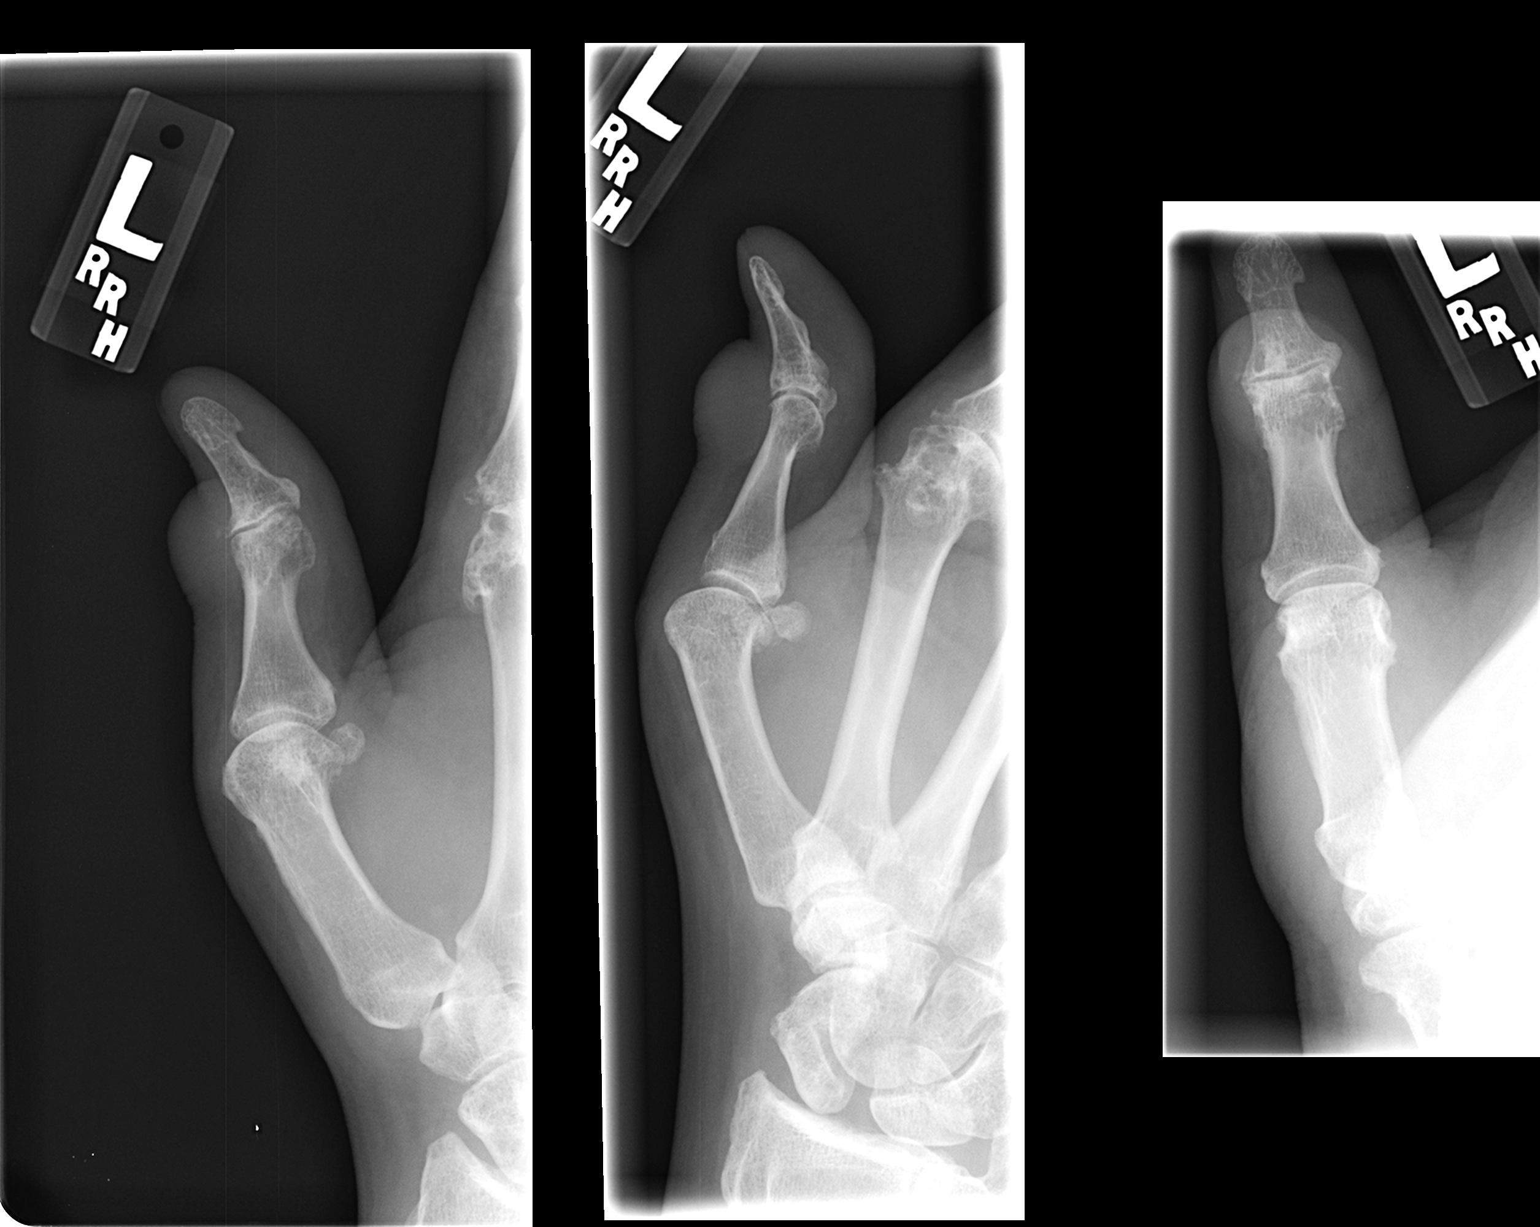

[1 of 1 positions shown; findings below may reference images not displayed]

FINDINGS: Joint space narrowing at IP joint of the right thumb.
Associated overlying soft tissue swelling.
Minimal juxta-articular erosions.
Findings suggest gout.
Additional right hepatic changes are identified at the margin of
the film involving the margin of the second MCP joint.
No additional fracture or dislocation.
IMPRESSION: Probable gout changes involving the right second MCP joint and IP
joint right thumb.

## 2010-03-14 ENCOUNTER — Ambulatory Visit (HOSPITAL_COMMUNITY): Payer: Self-pay | Admitting: Physical Therapy

## 2010-03-16 ENCOUNTER — Emergency Department (HOSPITAL_COMMUNITY): Payer: BC Managed Care – PPO

## 2010-03-16 ENCOUNTER — Emergency Department (HOSPITAL_COMMUNITY)
Admission: EM | Admit: 2010-03-16 | Discharge: 2010-03-16 | Disposition: A | Discharge status: Home / Self Care | Payer: BC Managed Care – PPO | Attending: Emergency Medicine | Admitting: Emergency Medicine

## 2010-03-16 DIAGNOSIS — R109 Unspecified abdominal pain: Secondary | ICD-10-CM | POA: Insufficient documentation

## 2010-03-16 DIAGNOSIS — N289 Disorder of kidney and ureter, unspecified: Secondary | ICD-10-CM | POA: Insufficient documentation

## 2010-03-16 LAB — COMPREHENSIVE METABOLIC PANEL
ALT: 14 U/L (ref 0–53)
AST: 20 U/L (ref 0–37)
Albumin: 3.4 g/dL — ABNORMAL LOW (ref 3.5–5.2)
Calcium: 9.4 mg/dL (ref 8.4–10.5)
GFR calc Af Amer: 31 mL/min — ABNORMAL LOW (ref >60)
Sodium: 139 mEq/L (ref 135–145)
Total Protein: 6.8 g/dL (ref 6.0–8.3)

## 2010-03-16 LAB — URINALYSIS, ROUTINE W REFLEX MICROSCOPIC
Bilirubin Urine: NEGATIVE
Ketones, ur: NEGATIVE mg/dL
Leukocytes, UA: NEGATIVE
Urine Glucose, Fasting: NEGATIVE mg/dL
pH: 5.5 (ref 5.0–8.0)

## 2010-03-16 LAB — URINE MICROSCOPIC-ADD ON

## 2010-03-16 LAB — CBC
MCHC: 32.6 g/dL (ref 30.0–36.0)
Platelets: 176 10*3/uL (ref 150–400)
RDW: 17.9 % — ABNORMAL HIGH (ref 11.5–15.5)

## 2010-03-16 LAB — DIFFERENTIAL
Basophils Absolute: 0 10*3/uL (ref 0.0–0.1)
Lymphs Abs: 1.4 10*3/uL (ref 0.7–4.0)
Monocytes Absolute: 1.6 10*3/uL — ABNORMAL HIGH (ref 0.1–1.0)
Neutrophils Relative %: 82 % — ABNORMAL HIGH (ref 43–77)

## 2010-03-17 LAB — URINE CULTURE: Culture: NO GROWTH

## 2010-03-19 ENCOUNTER — Ambulatory Visit (HOSPITAL_COMMUNITY): Payer: Self-pay

## 2010-03-19 ENCOUNTER — Ambulatory Visit (HOSPITAL_COMMUNITY): Payer: Self-pay | Admitting: Specialist

## 2010-03-20 ENCOUNTER — Emergency Department (HOSPITAL_COMMUNITY): Payer: BC Managed Care – PPO

## 2010-03-20 ENCOUNTER — Encounter (HOSPITAL_COMMUNITY): Payer: Self-pay

## 2010-03-20 ENCOUNTER — Inpatient Hospital Stay (HOSPITAL_COMMUNITY)
Admission: EM | Admit: 2010-03-20 | Discharge: 2010-03-23 | DRG: 463 | Disposition: A | Discharge status: Home / Self Care | Payer: BC Managed Care – PPO | Attending: Nephrology | Admitting: Nephrology

## 2010-03-20 DIAGNOSIS — G8929 Other chronic pain: Secondary | ICD-10-CM | POA: Diagnosis present

## 2010-03-20 DIAGNOSIS — I129 Hypertensive chronic kidney disease with stage 1 through stage 4 chronic kidney disease, or unspecified chronic kidney disease: Secondary | ICD-10-CM | POA: Diagnosis present

## 2010-03-20 DIAGNOSIS — N183 Chronic kidney disease, stage 3 unspecified: Secondary | ICD-10-CM | POA: Diagnosis present

## 2010-03-20 DIAGNOSIS — R52 Pain, unspecified: Principal | ICD-10-CM | POA: Diagnosis present

## 2010-03-20 DIAGNOSIS — N390 Urinary tract infection, site not specified: Secondary | ICD-10-CM | POA: Diagnosis present

## 2010-03-20 DIAGNOSIS — M109 Gout, unspecified: Secondary | ICD-10-CM | POA: Diagnosis not present

## 2010-03-20 DIAGNOSIS — R000 Tachycardia, unspecified: Secondary | ICD-10-CM | POA: Diagnosis present

## 2010-03-20 DIAGNOSIS — D72829 Elevated white blood cell count, unspecified: Secondary | ICD-10-CM | POA: Diagnosis present

## 2010-03-20 DIAGNOSIS — Z86718 Personal history of other venous thrombosis and embolism: Secondary | ICD-10-CM

## 2010-03-20 DIAGNOSIS — N179 Acute kidney failure, unspecified: Secondary | ICD-10-CM | POA: Diagnosis present

## 2010-03-20 DIAGNOSIS — M461 Sacroiliitis, not elsewhere classified: Secondary | ICD-10-CM | POA: Diagnosis present

## 2010-03-20 LAB — DIFFERENTIAL
Basophils Absolute: 0 10*3/uL (ref 0.0–0.1)
Lymphocytes Relative: 11 % — ABNORMAL LOW (ref 12–46)
Lymphs Abs: 2 10*3/uL (ref 0.7–4.0)
Monocytes Absolute: 1.9 10*3/uL — ABNORMAL HIGH (ref 0.1–1.0)
Monocytes Relative: 11 % (ref 3–12)
Neutro Abs: 14 10*3/uL — ABNORMAL HIGH (ref 1.7–7.7)

## 2010-03-20 LAB — URINALYSIS, ROUTINE W REFLEX MICROSCOPIC
Ketones, ur: NEGATIVE mg/dL
Leukocytes, UA: NEGATIVE
Nitrite: NEGATIVE
Protein, ur: >300 mg/dL — AB
pH: 5.5 (ref 5.0–8.0)

## 2010-03-20 LAB — COMPREHENSIVE METABOLIC PANEL
ALT: 14 U/L (ref 0–53)
Alkaline Phosphatase: 70 U/L (ref 39–117)
BUN: 33 mg/dL — ABNORMAL HIGH (ref 6–23)
CO2: 23 mEq/L (ref 19–32)
Calcium: 9.1 mg/dL (ref 8.4–10.5)
GFR calc non Af Amer: 26 mL/min — ABNORMAL LOW (ref >60)
Glucose, Bld: 116 mg/dL — ABNORMAL HIGH (ref 70–99)
Sodium: 134 mEq/L — ABNORMAL LOW (ref 135–145)

## 2010-03-20 LAB — CBC
HCT: 41.1 % (ref 39.0–52.0)
Hemoglobin: 12.6 g/dL — ABNORMAL LOW (ref 13.0–17.0)
MCHC: 30.7 g/dL (ref 30.0–36.0)
MCV: 87.6 fL (ref 78.0–100.0)

## 2010-03-20 LAB — GLUCOSE, CAPILLARY: Glucose-Capillary: 100 mg/dL — ABNORMAL HIGH (ref 70–99)

## 2010-03-20 LAB — POCT CARDIAC MARKERS: Troponin i, poc: <0.05 ng/mL (ref 0.00–0.09)

## 2010-03-20 MED ORDER — XENON XE 133 GAS
10.0000 | GAS_FOR_INHALATION | Freq: Once | RESPIRATORY_TRACT | Status: AC | PRN
  Administered 2010-03-20: 20.6 via RESPIRATORY_TRACT

## 2010-03-20 MED ORDER — TECHNETIUM TO 99M ALBUMIN AGGREGATED
6.0000 | Freq: Once | INTRAVENOUS | Status: AC | PRN
  Administered 2010-03-20: 5.5 via INTRAVENOUS

## 2010-03-21 ENCOUNTER — Ambulatory Visit (HOSPITAL_COMMUNITY): Payer: BC Managed Care – PPO | Admitting: Specialist

## 2010-03-21 ENCOUNTER — Ambulatory Visit (HOSPITAL_COMMUNITY): Payer: BC Managed Care – PPO | Admitting: Physical Therapy

## 2010-03-21 LAB — BASIC METABOLIC PANEL
BUN: 39 mg/dL — ABNORMAL HIGH (ref 6–23)
CO2: 23 mEq/L (ref 19–32)
Calcium: 8.9 mg/dL (ref 8.4–10.5)
Chloride: 108 mEq/L (ref 96–112)
Creatinine, Ser: 3.44 mg/dL — ABNORMAL HIGH (ref 0.4–1.5)
GFR calc Af Amer: 24 mL/min — ABNORMAL LOW (ref >60)
GFR calc non Af Amer: 20 mL/min — ABNORMAL LOW (ref >60)
Glucose, Bld: 104 mg/dL — ABNORMAL HIGH (ref 70–99)
Potassium: 5.3 mEq/L — ABNORMAL HIGH (ref 3.5–5.1)
Sodium: 140 mEq/L (ref 135–145)

## 2010-03-21 LAB — URINALYSIS, ROUTINE W REFLEX MICROSCOPIC
Bilirubin Urine: NEGATIVE
Ketones, ur: NEGATIVE mg/dL
Nitrite: NEGATIVE
Urobilinogen, UA: 0.2 mg/dL (ref 0.0–1.0)

## 2010-03-21 LAB — MRSA PCR SCREENING: MRSA by PCR: POSITIVE — AB

## 2010-03-21 LAB — GLUCOSE, CAPILLARY: Glucose-Capillary: 96 mg/dL (ref 70–99)

## 2010-03-22 LAB — DIFFERENTIAL
Eosinophils Relative: 0 % (ref 0–5)
Lymphocytes Relative: 3 % — ABNORMAL LOW (ref 12–46)
Lymphs Abs: 0.6 10*3/uL — ABNORMAL LOW (ref 0.7–4.0)
Neutro Abs: 20.3 10*3/uL — ABNORMAL HIGH (ref 1.7–7.7)
Neutrophils Relative %: 95 % — ABNORMAL HIGH (ref 43–77)

## 2010-03-22 LAB — CBC
HCT: 34 % — ABNORMAL LOW (ref 39.0–52.0)
Hemoglobin: 10.6 g/dL — ABNORMAL LOW (ref 13.0–17.0)
MCV: 86.1 fL (ref 78.0–100.0)
RBC: 3.95 MIL/uL — ABNORMAL LOW (ref 4.22–5.81)
WBC: 21.5 10*3/uL — ABNORMAL HIGH (ref 4.0–10.5)

## 2010-03-22 LAB — COMPREHENSIVE METABOLIC PANEL
AST: 18 U/L (ref 0–37)
BUN: 42 mg/dL — ABNORMAL HIGH (ref 6–23)
CO2: 23 mEq/L (ref 19–32)
Calcium: 9 mg/dL (ref 8.4–10.5)
Creatinine, Ser: 3.48 mg/dL — ABNORMAL HIGH (ref 0.4–1.5)
GFR calc Af Amer: 24 mL/min — ABNORMAL LOW (ref >60)
GFR calc non Af Amer: 19 mL/min — ABNORMAL LOW (ref >60)

## 2010-03-22 LAB — URINE CULTURE
Culture  Setup Time: 201202170200
Special Requests: NEGATIVE

## 2010-03-22 LAB — SEDIMENTATION RATE: Sed Rate: 76 mm/hr — ABNORMAL HIGH (ref 0–16)

## 2010-03-23 LAB — CBC
MCHC: 31.7 g/dL (ref 30.0–36.0)
RDW: 16.8 % — ABNORMAL HIGH (ref 11.5–15.5)
WBC: 21.9 10*3/uL — ABNORMAL HIGH (ref 4.0–10.5)

## 2010-03-23 LAB — COMPREHENSIVE METABOLIC PANEL
Albumin: 2.2 g/dL — ABNORMAL LOW (ref 3.5–5.2)
Alkaline Phosphatase: 48 U/L (ref 39–117)
BUN: 60 mg/dL — ABNORMAL HIGH (ref 6–23)
CO2: 21 mEq/L (ref 19–32)
Chloride: 104 mEq/L (ref 96–112)
Creatinine, Ser: 3.66 mg/dL — ABNORMAL HIGH (ref 0.4–1.5)
GFR calc non Af Amer: 18 mL/min — ABNORMAL LOW (ref >60)
Potassium: 4.8 mEq/L (ref 3.5–5.1)
Total Bilirubin: 0.4 mg/dL (ref 0.3–1.2)

## 2010-03-23 LAB — DIFFERENTIAL
Basophils Absolute: 0 10*3/uL (ref 0.0–0.1)
Basophils Relative: 0 % (ref 0–1)
Eosinophils Relative: 0 % (ref 0–5)
Lymphocytes Relative: 2 % — ABNORMAL LOW (ref 12–46)
Monocytes Absolute: 0.8 10*3/uL (ref 0.1–1.0)
Neutro Abs: 20.6 10*3/uL — ABNORMAL HIGH (ref 1.7–7.7)

## 2010-03-23 LAB — SEDIMENTATION RATE: Sed Rate: 120 mm/hr — ABNORMAL HIGH (ref 0–16)

## 2010-03-26 ENCOUNTER — Ambulatory Visit (HOSPITAL_COMMUNITY): Payer: BC Managed Care – PPO | Admitting: Physical Therapy

## 2010-03-26 ENCOUNTER — Ambulatory Visit (HOSPITAL_COMMUNITY): Payer: BC Managed Care – PPO | Admitting: Specialist

## 2010-03-26 DIAGNOSIS — R262 Difficulty in walking, not elsewhere classified: Secondary | ICD-10-CM | POA: Insufficient documentation

## 2010-03-26 DIAGNOSIS — IMO0001 Reserved for inherently not codable concepts without codable children: Secondary | ICD-10-CM | POA: Insufficient documentation

## 2010-03-26 DIAGNOSIS — M6281 Muscle weakness (generalized): Secondary | ICD-10-CM | POA: Insufficient documentation

## 2010-03-26 DIAGNOSIS — I10 Essential (primary) hypertension: Secondary | ICD-10-CM | POA: Insufficient documentation

## 2010-03-26 LAB — CULTURE, BLOOD (ROUTINE X 2)

## 2010-03-28 ENCOUNTER — Ambulatory Visit (HOSPITAL_COMMUNITY): Payer: BC Managed Care – PPO | Admitting: Specialist

## 2010-03-28 ENCOUNTER — Ambulatory Visit (HOSPITAL_COMMUNITY): Payer: BC Managed Care – PPO

## 2010-03-30 NOTE — H&P (Signed)
NAME:  Jack Irwin, Jack Irwin            ACCOUNT NO.:  [id number]  MEDICAL RECORD NO.:  [id number]           PATIENT TYPE:  I  LOCATION:  A330                          FACILITY:  [facility]  PHYSICIAN:  Ulice Bold, MD   DATE OF BIRTH:  1965-05-23  DATE OF ADMISSION:  03/20/2010 DATE OF DISCHARGE:  LH                             HISTORY & PHYSICAL   CHIEF COMPLAINT:  Back pain.  HISTORY OF PRESENT ILLNESS:  The patient is a 45 year old African American male with a past medical history of gout, CKD who presents to the ER with a chief complaint of back pain.  History of present illness dates back to Saturday when the patient started having back pain 10 out of 10 progressive.  The patient says the pain is constantly there and with intermittent increase in shortness.  The patient also complained of shortness of breath and described it as off and on.  The patient complains of decreased output since morning.  But later during the H and P, the patient did have another episode of micturition, and his urine output was as per him normal.  No complaint of chest pain.  No complaint of cough, fever, or chills.  No complaint of trauma.  No complaint of fall.  No complaint of syncope.  The patient came to ER on February 7 with a chief complaint of high blood pressure then came on March 16, 2010, with bank back pain and started on Keflex at that time with thought process that the patient might have UTI.  The patient again presents on March 20, 2010 , with the complaint of back pain.  PAST MEDICAL HISTORY: 1. CKD stage III 2. Chronic tachycardia. 3. Third chronic leukocytosis. 4. Morbid obesity. 5. Severe deconditioning 6. History of DVT.  The patient does have a history of DVT was on     Coumadin, but he is no more on Coumadin. 7. Gout.  SOCIAL HISTORY:  Nonsmoker, nondrinker.  Meds as an outpatient.  The patient is on allopurinol 300 mg p.o. daily, metoprolol 50 mg b.i.d., oxycodone 5/5  p.o. q.4 h p.r.n., PhosLo 667 p.o. t.i.d., prednisone as needed, Lasix 40 mg p.o. once daily, Keflex 1000 mg q.i.d.  Review of systems is negative besides the one mention in HPI.  PHYSICAL EXAMINATION:  VITAL SIGNS:  Blood pressure 151/94, pulse 150, respiratory rate 28, and temperature 100.4. GENERAL:  The patient is awake, alert, oriented to time, place, and person. HEENT:  Pupils are equally reactive to light and accommodation.  Head atraumatic, normocephalic. NECK:  Supple. CHEST:  No acute respiratory distress.  Clear to auscultation bilaterally. HEART:  S1 and S2 regular, tachycardic. GI:  Morbidly obese.  Bowel sounds present, soft, nontender, and nondistended. EXTREMITIES:  No lower extremity edema. CNS:  Cranial nerves II through XII grossly intact.  No focal neurological deficits.  LABORATORY DATA:  Sodium 134, potassium 4.7, chloride 100, bicarb 23, BUN 33, serum creatinine 2.7, glucose 116.  The patient's WBC 18.  The patient's hemoglobin 12.6, platelets 227, AST 18, ALT 14, ALP 70, albumin 3.1, troponin 0.05.  Ventilation perfusion scan done this morning was normal.  There was no mismatch.  Chest x-ray only showed low lung volumes UA showed protein, but nitrite negative, leuk es negative, wbc only 0-2, rbc only 0-2.  Urine culture done on 02/11 was negative.  IMPRESSION: 1. Back pain.  The patient has exacerbation of chronic back pain.  The     patient has history of sacroiliitis, is on oxycodone as an     outpatient we will add Dilaudid, cannot give NSAID as the patient     has chronic kidney disease stage III. 2. Urinary tract infection.  The patient came to the ER on March 13, 2009, when the patient was thought to be having urinary tract     infection.  The patient is on Keflex for that.  We will continue     Keflex and completed the course. 3. Chronic kidney disease stage III.  Creatinine is 2.7.  Chronic     kidney disease is stable at this time. 4.  Gout.  The patient is not in gout flare.  We will continue     outpatient allopurinol. 5. Deep vein thrombosis.  The patient has history of deep vein     thrombosis was on Coumadin,  not on Coumadin any more.  We will     keep on DVT prophylaxis. 6. The patient has severe morbid obesity and severe deconditioning.     The patient has been Hospital in 2011 in the ER for around 10 visit     not sure if the patient needs placement or the patient is hospital     bed.  This has to be evaluated more. 7. Cardiovascular: Tachycardia.  The patient with pain and did not     take his beta-blockers.  Tachycardia is chronic and probably not     taking beta-blockers has increased. 8. Respiratory:  The patient has no PE as perfusion/ventilation scan     was normal.  PLAN: 1. We will start Dilaudid for acute pain control. 2. We will continue outpatient medication. 3. We will give gentle IV hydration. 4. We will ask for social worker help to see whether the patient needs     any hospital bed or any other kind of home health service at home. 5. The patient's discharge will depend on the further clinical course     during the hospital stay.     Ulice Bold, MD     MB/MEDQ  D:  03/20/2010  T:  03/21/2010  Job:  [id number]  Electronically Signed by Ulice Bold M.D. on 03/30/2010 08:50:59 AM

## 2010-03-30 NOTE — Discharge Summary (Signed)
  NAME:  Jack Irwin, Jack Irwin            ACCOUNT NO.:  [id number]  MEDICAL RECORD NO.:  [id number]           PATIENT TYPE:  I  LOCATION:  [facility]                          FACILITY:  [facility]  PHYSICIAN:  Ulice Bold, MD   DATE OF BIRTH:  [birth year]  DATE OF ADMISSION:  03/20/2010 DATE OF DISCHARGE:  02/18/2012LH                              DISCHARGE SUMMARY   ADMITTING DIAGNOSES: 1. Back pain. 2. Urinary tract infection. 3. Chronic kidney disease. 4. Gout. 5. History of deep venous thrombosis. 6. Morbid obesity. 7. Deconditioning. 8. Chronic tachycardia.  DISCHARGE DIAGNOSES: 1. Back pain, not well controlled. 2. Chronic kidney disease, stage III. 3. Gout. 4. History of deep venous thrombosis. 5. Morbid obesity. 6. Severe deconditioning.  TESTS DONE DURING THE HOSPITAL: 1. Chest x-ray done on February 15 shows low lung volumes. 2. Nuclear medicine ventilation perfusion scan done, shows no     ventilation perfusion mismatch.  HOSPITAL COURSE AND TREATMENT: 1. Back pain.  The patient was admitted with exacerbation of chronic     back pain.  The patient does have a history of sacroiliitis and was     on oxycodone as an outpatient.  During that admission, the patient     was started on Dilaudid.  The patient's pain was well-controlled at     the time of discharge. 2. UTI.  The patient came to ER on February 8 and was thought to     having UTI, was started on Keflex for that.  The patient's Keflex     was continued.  The patient has now had 10 days of antibiotic     treatment. 3. Renal.  The patient has CKD stage III.  The patient's baseline     creatinine is down 3.  The patient needs to follow up with his     primary nephrologist as an outpatient. 4. Gout.  The patient had a bout of acute gout during the hospital     stay and was started on prednisone.  The patient's gout is much     better at the time of discharge. 5. DVT.  The patient has a history of DVT and was on  Coumadin for more     than 6 months.  He is not on Coumadin any more. 6. Leukocytosis.  The patient has a history of chronic leukocytosis.  DISCHARGE MEDICATIONS:  The patient is being discharged on; 1. Allopurinol 100 mg p.o. daily. 2. Prednisone taper. 3. Calcium acetate. 4. Colchicine 0.6 mg on p.r.n. basis. 5. Lasix 40 mg p.o. daily on as-needed basis. 6. Metoprolol 50 mg p.o. b.i.d. 7. Oxycodone 5/325 on p.r.n. basis.  We have stopped the patient's allopurinol of 300 mg p.o. daily as the patient's GFR is low for this high dose.  FOLLOWUP:  The patient is to follow up with the primary care on Thursday and the patient is to follow up with his primary nephrologist.     Ulice Bold, MD     MB/MEDQ  D:  03/23/2010  T:  03/23/2010  Job:  [id number]  Electronically Signed by Ulice Bold M.D. on 03/30/2010 08:50:50 AM

## 2010-04-02 ENCOUNTER — Ambulatory Visit (HOSPITAL_COMMUNITY)
Admission: RE | Admit: 2010-04-02 | Discharge: 2010-04-02 | Disposition: A | Discharge status: Home / Self Care | Payer: BC Managed Care – PPO | Source: Ambulatory Visit

## 2010-04-02 ENCOUNTER — Ambulatory Visit (HOSPITAL_COMMUNITY)
Admission: RE | Admit: 2010-04-02 | Discharge: 2010-04-02 | Disposition: A | Discharge status: Home / Self Care | Payer: BC Managed Care – PPO | Source: Ambulatory Visit | Attending: Internal Medicine | Admitting: Internal Medicine

## 2010-04-09 ENCOUNTER — Ambulatory Visit (HOSPITAL_COMMUNITY)
Admission: RE | Admit: 2010-04-09 | Discharge: 2010-04-09 | Disposition: A | Discharge status: Home / Self Care | Payer: BC Managed Care – PPO | Source: Ambulatory Visit | Attending: Internal Medicine | Admitting: Internal Medicine

## 2010-04-09 DIAGNOSIS — R262 Difficulty in walking, not elsewhere classified: Secondary | ICD-10-CM | POA: Insufficient documentation

## 2010-04-09 DIAGNOSIS — I10 Essential (primary) hypertension: Secondary | ICD-10-CM | POA: Insufficient documentation

## 2010-04-09 DIAGNOSIS — IMO0001 Reserved for inherently not codable concepts without codable children: Secondary | ICD-10-CM | POA: Insufficient documentation

## 2010-04-09 DIAGNOSIS — M6281 Muscle weakness (generalized): Secondary | ICD-10-CM | POA: Insufficient documentation

## 2010-04-11 ENCOUNTER — Ambulatory Visit (HOSPITAL_COMMUNITY)
Admission: RE | Admit: 2010-04-11 | Discharge: 2010-04-11 | Disposition: A | Discharge status: Home / Self Care | Payer: BC Managed Care – PPO | Source: Ambulatory Visit | Attending: Internal Medicine | Admitting: Internal Medicine

## 2010-04-15 LAB — CBC
Platelets: 232 10*3/uL (ref 150–400)
RBC: 4.6 MIL/uL (ref 4.22–5.81)
RDW: 21.1 % — ABNORMAL HIGH (ref 11.5–15.5)
WBC: 14.1 10*3/uL — ABNORMAL HIGH (ref 4.0–10.5)

## 2010-04-15 LAB — COMPREHENSIVE METABOLIC PANEL
ALT: 11 U/L (ref 0–53)
AST: 21 U/L (ref 0–37)
Albumin: 3.6 g/dL (ref 3.5–5.2)
Alkaline Phosphatase: 61 U/L (ref 39–117)
Chloride: 105 mEq/L (ref 96–112)
GFR calc Af Amer: 36 mL/min — ABNORMAL LOW (ref >60)
Potassium: 4.4 mEq/L (ref 3.5–5.1)
Sodium: 136 mEq/L (ref 135–145)
Total Protein: 6.4 g/dL (ref 6.0–8.3)

## 2010-04-15 LAB — URINALYSIS, ROUTINE W REFLEX MICROSCOPIC
Glucose, UA: NEGATIVE mg/dL
Ketones, ur: NEGATIVE mg/dL
Leukocytes, UA: NEGATIVE
Protein, ur: 30 mg/dL — AB

## 2010-04-15 LAB — DIFFERENTIAL
Band Neutrophils: 0 % (ref 0–10)
Basophils Absolute: 0.1 10*3/uL (ref 0.0–0.1)
Basophils Relative: 1 % (ref 0–1)
Lymphocytes Relative: 12 % (ref 12–46)
Lymphs Abs: 1.7 10*3/uL (ref 0.7–4.0)
Metamyelocytes Relative: 0 %
Myelocytes: 0 %
Promyelocytes Absolute: 0 %

## 2010-04-15 LAB — LIPASE, BLOOD: Lipase: 45 U/L (ref 11–59)

## 2010-04-16 ENCOUNTER — Ambulatory Visit (HOSPITAL_COMMUNITY): Payer: BC Managed Care – PPO

## 2010-04-17 LAB — DIFFERENTIAL
Basophils Absolute: 0 10*3/uL (ref 0.0–0.1)
Basophils Absolute: 0.1 10*3/uL (ref 0.0–0.1)
Basophils Relative: 0 % (ref 0–1)
Basophils Relative: 1 % (ref 0–1)
Eosinophils Absolute: 0 10*3/uL (ref 0.0–0.7)
Eosinophils Absolute: 0 10*3/uL (ref 0.0–0.7)
Lymphocytes Relative: 4 % — ABNORMAL LOW (ref 12–46)
Monocytes Relative: 5 % (ref 3–12)
Neutro Abs: 13.5 10*3/uL — ABNORMAL HIGH (ref 1.7–7.7)
Neutro Abs: 20.3 10*3/uL — ABNORMAL HIGH (ref 1.7–7.7)
Neutro Abs: 20.3 10*3/uL — ABNORMAL HIGH (ref 1.7–7.7)
Neutrophils Relative %: 87 % — ABNORMAL HIGH (ref 43–77)
Neutrophils Relative %: 91 % — ABNORMAL HIGH (ref 43–77)

## 2010-04-17 LAB — GLUCOSE, CAPILLARY
Glucose-Capillary: 187 mg/dL — ABNORMAL HIGH (ref 70–99)
Glucose-Capillary: 274 mg/dL — ABNORMAL HIGH (ref 70–99)
Glucose-Capillary: 378 mg/dL — ABNORMAL HIGH (ref 70–99)

## 2010-04-17 LAB — PHOSPHORUS: Phosphorus: 2.9 mg/dL (ref 2.3–4.6)

## 2010-04-17 LAB — CBC
HCT: 30.9 % — ABNORMAL LOW (ref 39.0–52.0)
Hemoglobin: 10 g/dL — ABNORMAL LOW (ref 13.0–17.0)
Hemoglobin: 9 g/dL — ABNORMAL LOW (ref 13.0–17.0)
MCH: 27.8 pg (ref 26.0–34.0)
MCH: 27.9 pg (ref 26.0–34.0)
MCH: 27.9 pg (ref 26.0–34.0)
MCHC: 32.4 g/dL (ref 30.0–36.0)
MCHC: 32.5 g/dL (ref 30.0–36.0)
MCHC: 32.5 g/dL (ref 30.0–36.0)
Platelets: 193 10*3/uL (ref 150–400)
Platelets: 218 10*3/uL (ref 150–400)
RBC: 3.23 MIL/uL — ABNORMAL LOW (ref 4.22–5.81)
RDW: 19.9 % — ABNORMAL HIGH (ref 11.5–15.5)

## 2010-04-17 LAB — BASIC METABOLIC PANEL
BUN: 28 mg/dL — ABNORMAL HIGH (ref 6–23)
BUN: 35 mg/dL — ABNORMAL HIGH (ref 6–23)
CO2: 22 mEq/L (ref 19–32)
CO2: 22 mEq/L (ref 19–32)
CO2: 23 mEq/L (ref 19–32)
Calcium: 8.6 mg/dL (ref 8.4–10.5)
Chloride: 106 mEq/L (ref 96–112)
Chloride: 108 mEq/L (ref 96–112)
Creatinine, Ser: 1.98 mg/dL — ABNORMAL HIGH (ref 0.4–1.5)
Creatinine, Ser: 2.34 mg/dL — ABNORMAL HIGH (ref 0.4–1.5)
GFR calc Af Amer: 37 mL/min — ABNORMAL LOW (ref >60)
GFR calc Af Amer: 46 mL/min — ABNORMAL LOW (ref >60)
GFR calc non Af Amer: 37 mL/min — ABNORMAL LOW (ref >60)
Glucose, Bld: 217 mg/dL — ABNORMAL HIGH (ref 70–99)
Sodium: 137 mEq/L (ref 135–145)

## 2010-04-17 LAB — URINALYSIS, ROUTINE W REFLEX MICROSCOPIC
Glucose, UA: 500 mg/dL — AB
Leukocytes, UA: NEGATIVE
Leukocytes, UA: NEGATIVE
Nitrite: NEGATIVE
Protein, ur: NEGATIVE mg/dL
Protein, ur: NEGATIVE mg/dL
Specific Gravity, Urine: 1.015 (ref 1.005–1.030)
Urobilinogen, UA: 0.2 mg/dL (ref 0.0–1.0)

## 2010-04-17 LAB — C-REACTIVE PROTEIN: CRP: 27.1 mg/dL — ABNORMAL HIGH (ref <0.6)

## 2010-04-17 LAB — URINE MICROSCOPIC-ADD ON

## 2010-04-17 LAB — MAGNESIUM
Magnesium: 0.9 mg/dL — CL (ref 1.5–2.5)
Magnesium: 1.4 mg/dL — ABNORMAL LOW (ref 1.5–2.5)
Magnesium: 1.9 mg/dL (ref 1.5–2.5)

## 2010-04-17 LAB — COMPREHENSIVE METABOLIC PANEL
CO2: 20 mEq/L (ref 19–32)
Calcium: 8.6 mg/dL (ref 8.4–10.5)
Creatinine, Ser: 2.58 mg/dL — ABNORMAL HIGH (ref 0.4–1.5)
GFR calc non Af Amer: 27 mL/min — ABNORMAL LOW (ref >60)
Glucose, Bld: 267 mg/dL — ABNORMAL HIGH (ref 70–99)

## 2010-04-17 LAB — HEMOGLOBIN A1C: Mean Plasma Glucose: 123 mg/dL — ABNORMAL HIGH (ref <117)

## 2010-04-17 LAB — URINE CULTURE: Culture  Setup Time: 201110041323

## 2010-04-17 LAB — URIC ACID: Uric Acid, Serum: 11 mg/dL — ABNORMAL HIGH (ref 4.0–7.8)

## 2010-04-17 LAB — D-DIMER, QUANTITATIVE: D-Dimer, Quant: 7.93 ug/mL-FEU — ABNORMAL HIGH (ref 0.00–0.48)

## 2010-04-18 ENCOUNTER — Ambulatory Visit (HOSPITAL_COMMUNITY): Payer: BC Managed Care – PPO | Admitting: Physical Therapy

## 2010-04-18 LAB — CBC
HCT: 34.2 % — ABNORMAL LOW (ref 39.0–52.0)
HCT: 34.6 % — ABNORMAL LOW (ref 39.0–52.0)
HCT: 36.1 % — ABNORMAL LOW (ref 39.0–52.0)
Hemoglobin: 11.5 g/dL — ABNORMAL LOW (ref 13.0–17.0)
MCH: 27.9 pg (ref 26.0–34.0)
MCH: 27.7 pg (ref 26.0–34.0)
MCHC: 32.3 g/dL (ref 30.0–36.0)
MCHC: 32 g/dL (ref 30.0–36.0)
MCV: 85.5 fL (ref 78.0–100.0)
MCV: 85.8 fL (ref 78.0–100.0)
Platelets: 232 10*3/uL (ref 150–400)
RBC: 4 MIL/uL — ABNORMAL LOW (ref 4.22–5.81)
RBC: 4.17 MIL/uL — ABNORMAL LOW (ref 4.22–5.81)
RDW: 21.1 % — ABNORMAL HIGH (ref 11.5–15.5)

## 2010-04-18 LAB — DIFFERENTIAL
Basophils Absolute: 0 10*3/uL (ref 0.0–0.1)
Basophils Absolute: 0 10*3/uL (ref 0.0–0.1)
Basophils Absolute: 0 10*3/uL (ref 0.0–0.1)
Basophils Relative: 0 % (ref 0–1)
Basophils Relative: 0 % (ref 0–1)
Basophils Relative: 0 % (ref 0–1)
Eosinophils Absolute: 0.1 10*3/uL (ref 0.0–0.7)
Eosinophils Absolute: 0.3 10*3/uL (ref 0.0–0.7)
Eosinophils Absolute: 0.1 10*3/uL (ref 0.0–0.7)
Eosinophils Relative: 1 % (ref 0–5)
Eosinophils Relative: 2 % (ref 0–5)
Eosinophils Relative: 1 % (ref 0–5)
Lymphocytes Relative: 15 % (ref 12–46)
Lymphocytes Relative: 7 % — ABNORMAL LOW (ref 12–46)
Lymphocytes Relative: 17 % (ref 12–46)
Lymphs Abs: 1.4 10*3/uL (ref 0.7–4.0)
Lymphs Abs: 2.4 10*3/uL (ref 0.7–4.0)
Lymphs Abs: 2.2 10*3/uL (ref 0.7–4.0)
Monocytes Absolute: 1.1 10*3/uL — ABNORMAL HIGH (ref 0.1–1.0)
Monocytes Absolute: 1.3 10*3/uL — ABNORMAL HIGH (ref 0.1–1.0)
Monocytes Absolute: 1.1 10*3/uL — ABNORMAL HIGH (ref 0.1–1.0)
Monocytes Relative: 5 % (ref 3–12)
Monocytes Relative: 8 % (ref 3–12)
Monocytes Relative: 8 % (ref 3–12)
Neutro Abs: 12 10*3/uL — ABNORMAL HIGH (ref 1.7–7.7)
Neutro Abs: 18.2 10*3/uL — ABNORMAL HIGH (ref 1.7–7.7)
Neutro Abs: 9.8 10*3/uL — ABNORMAL HIGH (ref 1.7–7.7)
Neutrophils Relative %: 75 % (ref 43–77)
Neutrophils Relative %: 88 % — ABNORMAL HIGH (ref 43–77)
Neutrophils Relative %: 74 % (ref 43–77)

## 2010-04-18 LAB — CULTURE, BLOOD (ROUTINE X 2)
Culture: NO GROWTH
Culture: NO GROWTH
Report Status: 10052011
Report Status: 10052011

## 2010-04-18 LAB — BASIC METABOLIC PANEL
BUN: 16 mg/dL (ref 6–23)
BUN: 16 mg/dL (ref 6–23)
BUN: 24 mg/dL — ABNORMAL HIGH (ref 6–23)
CO2: 22 mEq/L (ref 19–32)
CO2: 19 mEq/L (ref 19–32)
Calcium: 9.5 mg/dL (ref 8.4–10.5)
Calcium: 9.8 mg/dL (ref 8.4–10.5)
Chloride: 105 mEq/L (ref 96–112)
Chloride: 107 mEq/L (ref 96–112)
Chloride: 110 mEq/L (ref 96–112)
Creatinine, Ser: 2.25 mg/dL — ABNORMAL HIGH (ref 0.4–1.5)
Creatinine, Ser: 2.31 mg/dL — ABNORMAL HIGH (ref 0.4–1.5)
GFR calc Af Amer: 39 mL/min — ABNORMAL LOW (ref >60)
GFR calc Af Amer: 38 mL/min — ABNORMAL LOW (ref >60)
GFR calc non Af Amer: 32 mL/min — ABNORMAL LOW (ref >60)
GFR calc non Af Amer: 31 mL/min — ABNORMAL LOW (ref >60)
Glucose, Bld: 90 mg/dL (ref 70–99)
Glucose, Bld: 92 mg/dL (ref 70–99)
Glucose, Bld: 80 mg/dL (ref 70–99)
Potassium: 4 mEq/L (ref 3.5–5.1)
Potassium: 4.3 mEq/L (ref 3.5–5.1)
Potassium: 4.6 mEq/L (ref 3.5–5.1)
Sodium: 139 mEq/L (ref 135–145)
Sodium: 138 mEq/L (ref 135–145)

## 2010-04-18 LAB — URINE MICROSCOPIC-ADD ON

## 2010-04-18 LAB — URIC ACID: Uric Acid, Serum: 11.8 mg/dL — ABNORMAL HIGH (ref 4.0–7.8)

## 2010-04-18 LAB — HEPATIC FUNCTION PANEL
ALT: 11 U/L (ref 0–53)
Albumin: 3.4 g/dL — ABNORMAL LOW (ref 3.5–5.2)
Alkaline Phosphatase: 67 U/L (ref 39–117)
Total Bilirubin: 0.9 mg/dL (ref 0.3–1.2)
Total Protein: 6.9 g/dL (ref 6.0–8.3)

## 2010-04-18 LAB — URINALYSIS, ROUTINE W REFLEX MICROSCOPIC
Bilirubin Urine: NEGATIVE
Bilirubin Urine: NEGATIVE
Glucose, UA: NEGATIVE mg/dL
Hgb urine dipstick: NEGATIVE
Ketones, ur: NEGATIVE mg/dL
Ketones, ur: NEGATIVE mg/dL
Nitrite: NEGATIVE
Protein, ur: NEGATIVE mg/dL
Urobilinogen, UA: 0.2 mg/dL (ref 0.0–1.0)

## 2010-04-18 LAB — STOOL CULTURE

## 2010-04-18 LAB — CLOSTRIDIUM DIFFICILE EIA

## 2010-04-19 LAB — COMPREHENSIVE METABOLIC PANEL
AST: 13 U/L (ref 0–37)
CO2: 21 mEq/L (ref 19–32)
Calcium: 9.9 mg/dL (ref 8.4–10.5)
Creatinine, Ser: 2.71 mg/dL — ABNORMAL HIGH (ref 0.4–1.5)
GFR calc Af Amer: 31 mL/min — ABNORMAL LOW (ref >60)
GFR calc non Af Amer: 26 mL/min — ABNORMAL LOW (ref >60)
Glucose, Bld: 95 mg/dL (ref 70–99)

## 2010-04-19 LAB — LUPUS ANTICOAGULANT PANEL
Lupus Anticoagulant: DETECTED — AB
PTTLA 4:1 Mix: 71.2 secs — ABNORMAL HIGH (ref 30.0–45.6)
dRVVT Incubated 1:1 Mix: 41.5 secs (ref 36.2–44.3)

## 2010-04-19 LAB — DIFFERENTIAL
Basophils Absolute: 0 10*3/uL (ref 0.0–0.1)
Basophils Absolute: 0.1 10*3/uL (ref 0.0–0.1)
Basophils Relative: 0 % (ref 0–1)
Eosinophils Absolute: 0.1 10*3/uL (ref 0.0–0.7)
Eosinophils Relative: 0 % (ref 0–5)
Eosinophils Relative: 0 % (ref 0–5)
Lymphocytes Relative: 5 % — ABNORMAL LOW (ref 12–46)
Lymphocytes Relative: 9 % — ABNORMAL LOW (ref 12–46)
Lymphocytes Relative: 9 % — ABNORMAL LOW (ref 12–46)
Lymphs Abs: 1.2 10*3/uL (ref 0.7–4.0)
Lymphs Abs: 1.3 10*3/uL (ref 0.7–4.0)
Lymphs Abs: 1.6 10*3/uL (ref 0.7–4.0)
Monocytes Absolute: 1.5 10*3/uL — ABNORMAL HIGH (ref 0.1–1.0)
Monocytes Relative: 10 % (ref 3–12)
Monocytes Relative: 4 % (ref 3–12)
Neutro Abs: 14.6 10*3/uL — ABNORMAL HIGH (ref 1.7–7.7)
Neutro Abs: 17.8 10*3/uL — ABNORMAL HIGH (ref 1.7–7.7)
Neutrophils Relative %: 80 % — ABNORMAL HIGH (ref 43–77)
Neutrophils Relative %: 83 % — ABNORMAL HIGH (ref 43–77)
Neutrophils Relative %: 87 % — ABNORMAL HIGH (ref 43–77)

## 2010-04-19 LAB — CBC
HCT: 22.8 % — ABNORMAL LOW (ref 39.0–52.0)
Hemoglobin: 7.9 g/dL — ABNORMAL LOW (ref 13.0–17.0)
Hemoglobin: 9.7 g/dL — ABNORMAL LOW (ref 13.0–17.0)
MCH: 28.6 pg (ref 26.0–34.0)
MCHC: 32.3 g/dL (ref 30.0–36.0)
MCHC: 33.4 g/dL (ref 30.0–36.0)
MCV: 88.8 fL (ref 78.0–100.0)
MCV: 89 fL (ref 78.0–100.0)
Platelets: 303 10*3/uL (ref 150–400)
Platelets: 375 10*3/uL (ref 150–400)
Platelets: 395 10*3/uL (ref 150–400)
RBC: 2.76 MIL/uL — ABNORMAL LOW (ref 4.22–5.81)
RBC: 3.88 MIL/uL — ABNORMAL LOW (ref 4.22–5.81)
RDW: 19.7 % — ABNORMAL HIGH (ref 11.5–15.5)
RDW: 20.9 % — ABNORMAL HIGH (ref 11.5–15.5)
WBC: 15.1 10*3/uL — ABNORMAL HIGH (ref 4.0–10.5)
WBC: 17.6 10*3/uL — ABNORMAL HIGH (ref 4.0–10.5)
WBC: 20.3 10*3/uL — ABNORMAL HIGH (ref 4.0–10.5)

## 2010-04-19 LAB — PROTEIN S, TOTAL: Protein S Ag, Total: 132 % (ref 70–140)

## 2010-04-19 LAB — CARDIAC PANEL(CRET KIN+CKTOT+MB+TROPI)
CK, MB: 1.6 ng/mL (ref 0.3–4.0)
Relative Index: INVALID (ref 0.0–2.5)
Relative Index: INVALID (ref 0.0–2.5)
Troponin I: 0.02 ng/mL (ref 0.00–0.06)
Troponin I: 0.02 ng/mL (ref 0.00–0.06)
Troponin I: 0.04 ng/mL (ref 0.00–0.06)

## 2010-04-19 LAB — CALCIUM, URINE, 24 HOUR
Calcium, 24 hour urine: 84 mg/d — ABNORMAL LOW (ref 100–250)
Calcium, Ur: 7 mg/dL
Urine Total Volume-UCA24: 1200 mL

## 2010-04-19 LAB — BRAIN NATRIURETIC PEPTIDE: Pro B Natriuretic peptide (BNP): <30 pg/mL (ref 0.0–100.0)

## 2010-04-19 LAB — PROTIME-INR
INR: 1.1 (ref 0.00–1.49)
INR: 1.25 (ref 0.00–1.49)
INR: 1.44 (ref 0.00–1.49)
INR: 1.74 — ABNORMAL HIGH (ref 0.00–1.49)
Prothrombin Time: 14.4 seconds (ref 11.6–15.2)
Prothrombin Time: 15.9 seconds — ABNORMAL HIGH (ref 11.6–15.2)
Prothrombin Time: 17.7 seconds — ABNORMAL HIGH (ref 11.6–15.2)

## 2010-04-19 LAB — UIFE/LIGHT CHAINS/TP QN, 24-HR UR
Albumin, U: DETECTED
Alpha 1, Urine: DETECTED — AB
Beta, Urine: DETECTED — AB
Gamma Globulin, Urine: DETECTED — AB

## 2010-04-19 LAB — IGG, IGA, IGM
IgA: 269 mg/dL (ref 68–378)
IgG (Immunoglobin G), Serum: 1230 mg/dL (ref 694–1618)

## 2010-04-19 LAB — RAPID URINE DRUG SCREEN, HOSP PERFORMED
Amphetamines: NOT DETECTED
Benzodiazepines: NOT DETECTED
Tetrahydrocannabinol: NOT DETECTED

## 2010-04-19 LAB — PROTEIN ELECTROPH W RFLX QUANT IMMUNOGLOBULINS
Albumin ELP: 46.1 % — ABNORMAL LOW (ref 55.8–66.1)
Beta Globulin: 3.8 % — ABNORMAL LOW (ref 4.7–7.2)
Total Protein ELP: 6.6 g/dL (ref 6.0–8.3)

## 2010-04-19 LAB — PHOSPHORUS: Phosphorus: 3.6 mg/dL (ref 2.3–4.6)

## 2010-04-19 LAB — URINE MICROSCOPIC-ADD ON

## 2010-04-19 LAB — ANA: Anti Nuclear Antibody(ANA): NEGATIVE

## 2010-04-19 LAB — HEPARIN LEVEL (UNFRACTIONATED)
Heparin Unfractionated: 0.14 IU/mL — ABNORMAL LOW (ref 0.30–0.70)
Heparin Unfractionated: 0.27 IU/mL — ABNORMAL LOW (ref 0.30–0.70)
Heparin Unfractionated: 0.36 IU/mL (ref 0.30–0.70)
Heparin Unfractionated: 0.41 IU/mL (ref 0.30–0.70)

## 2010-04-19 LAB — CULTURE, BLOOD (ROUTINE X 2)
Culture: NO GROWTH
Report Status: 8142011
Report Status: 8142011

## 2010-04-19 LAB — PTH, INTACT AND CALCIUM: Calcium, Total (PTH): 10 mg/dL (ref 8.4–10.5)

## 2010-04-19 LAB — VITAMIN D 1,25 DIHYDROXY
Vitamin D2 1, 25 (OH)2: <8 pg/mL
Vitamin D3 1, 25 (OH)2: <8 pg/mL

## 2010-04-19 LAB — URIC ACID: Uric Acid, Serum: 12.2 mg/dL — ABNORMAL HIGH (ref 4.0–7.8)

## 2010-04-19 LAB — BASIC METABOLIC PANEL
BUN: 22 mg/dL (ref 6–23)
Calcium: 10.7 mg/dL — ABNORMAL HIGH (ref 8.4–10.5)
Calcium: 9.1 mg/dL (ref 8.4–10.5)
Chloride: 108 mEq/L (ref 96–112)
Creatinine, Ser: 2.46 mg/dL — ABNORMAL HIGH (ref 0.4–1.5)
GFR calc Af Amer: 32 mL/min — ABNORMAL LOW (ref >60)
GFR calc Af Amer: 35 mL/min — ABNORMAL LOW (ref >60)
GFR calc non Af Amer: 27 mL/min — ABNORMAL LOW (ref >60)
GFR calc non Af Amer: 29 mL/min — ABNORMAL LOW (ref >60)
Potassium: 4.7 mEq/L (ref 3.5–5.1)
Sodium: 137 mEq/L (ref 135–145)

## 2010-04-19 LAB — URINALYSIS, ROUTINE W REFLEX MICROSCOPIC
Bilirubin Urine: NEGATIVE
Glucose, UA: 100 mg/dL — AB
Ketones, ur: NEGATIVE mg/dL
Specific Gravity, Urine: >1.03 — ABNORMAL HIGH (ref 1.005–1.030)
pH: 5 (ref 5.0–8.0)

## 2010-04-19 LAB — URINE CULTURE
Culture  Setup Time: 201108102107
Special Requests: NEGATIVE

## 2010-04-19 LAB — CARDIOLIPIN ANTIBODIES, IGG, IGM, IGA
Anticardiolipin IgA: 4 APL U/mL — ABNORMAL LOW (ref <22)
Anticardiolipin IgG: 9 GPL U/mL — ABNORMAL LOW (ref <23)

## 2010-04-19 LAB — ANTITHROMBIN III: AntiThromb III Func: 67 % — ABNORMAL LOW (ref 76–126)

## 2010-04-19 LAB — FACTOR 5 LEIDEN

## 2010-04-19 LAB — NA AND K (SODIUM & POTASSIUM), RAND UR
Potassium Urine: 34 mEq/L
Sodium, Ur: 60 mEq/L

## 2010-04-19 LAB — MAGNESIUM
Magnesium: 1.2 mg/dL — ABNORMAL LOW (ref 1.5–2.5)
Magnesium: 2.1 mg/dL (ref 1.5–2.5)

## 2010-04-19 LAB — PROTEIN S ACTIVITY: Protein S Activity: 104 % (ref 69–129)

## 2010-04-19 LAB — HOMOCYSTEINE: Homocysteine: 27.9 umol/L — ABNORMAL HIGH (ref 4.0–15.4)

## 2010-04-19 LAB — D-DIMER, QUANTITATIVE: D-Dimer, Quant: 9.34 ug/mL-FEU — ABNORMAL HIGH (ref 0.00–0.48)

## 2010-04-20 ENCOUNTER — Emergency Department (HOSPITAL_COMMUNITY)
Admission: EM | Admit: 2010-04-20 | Discharge: 2010-04-20 | Disposition: A | Discharge status: Home / Self Care | Payer: BC Managed Care – PPO | Attending: Emergency Medicine | Admitting: Emergency Medicine

## 2010-04-20 DIAGNOSIS — N189 Chronic kidney disease, unspecified: Secondary | ICD-10-CM | POA: Insufficient documentation

## 2010-04-20 DIAGNOSIS — G8929 Other chronic pain: Secondary | ICD-10-CM | POA: Insufficient documentation

## 2010-04-20 DIAGNOSIS — Z79899 Other long term (current) drug therapy: Secondary | ICD-10-CM | POA: Insufficient documentation

## 2010-04-20 DIAGNOSIS — Z8639 Personal history of other endocrine, nutritional and metabolic disease: Secondary | ICD-10-CM | POA: Insufficient documentation

## 2010-04-20 DIAGNOSIS — E119 Type 2 diabetes mellitus without complications: Secondary | ICD-10-CM | POA: Insufficient documentation

## 2010-04-20 DIAGNOSIS — Z86718 Personal history of other venous thrombosis and embolism: Secondary | ICD-10-CM | POA: Insufficient documentation

## 2010-04-20 DIAGNOSIS — I129 Hypertensive chronic kidney disease with stage 1 through stage 4 chronic kidney disease, or unspecified chronic kidney disease: Secondary | ICD-10-CM | POA: Insufficient documentation

## 2010-04-20 DIAGNOSIS — M545 Low back pain, unspecified: Secondary | ICD-10-CM | POA: Insufficient documentation

## 2010-04-20 DIAGNOSIS — R1031 Right lower quadrant pain: Secondary | ICD-10-CM | POA: Insufficient documentation

## 2010-04-20 DIAGNOSIS — M533 Sacrococcygeal disorders, not elsewhere classified: Secondary | ICD-10-CM | POA: Insufficient documentation

## 2010-04-20 DIAGNOSIS — M129 Arthropathy, unspecified: Secondary | ICD-10-CM | POA: Insufficient documentation

## 2010-04-20 DIAGNOSIS — Z862 Personal history of diseases of the blood and blood-forming organs and certain disorders involving the immune mechanism: Secondary | ICD-10-CM | POA: Insufficient documentation

## 2010-04-20 LAB — GLUCOSE, CAPILLARY
Glucose-Capillary: 134 mg/dL — ABNORMAL HIGH (ref 70–99)
Glucose-Capillary: 139 mg/dL — ABNORMAL HIGH (ref 70–99)
Glucose-Capillary: 199 mg/dL — ABNORMAL HIGH (ref 70–99)
Glucose-Capillary: 227 mg/dL — ABNORMAL HIGH (ref 70–99)
Glucose-Capillary: 97 mg/dL (ref 70–99)
Glucose-Capillary: 97 mg/dL (ref 70–99)
Glucose-Capillary: 98 mg/dL (ref 70–99)

## 2010-04-20 LAB — BASIC METABOLIC PANEL
BUN: 46 mg/dL — ABNORMAL HIGH (ref 6–23)
CO2: 20 mEq/L (ref 19–32)
CO2: 20 mEq/L (ref 19–32)
CO2: 23 mEq/L (ref 19–32)
Calcium: 9.1 mg/dL (ref 8.4–10.5)
Calcium: 9.1 mg/dL (ref 8.4–10.5)
Chloride: 110 mEq/L (ref 96–112)
Creatinine, Ser: 2.59 mg/dL — ABNORMAL HIGH (ref 0.4–1.5)
Creatinine, Ser: 2.15 mg/dL — ABNORMAL HIGH (ref 0.4–1.5)
GFR calc non Af Amer: 27 mL/min — ABNORMAL LOW (ref >60)
Glucose, Bld: 186 mg/dL — ABNORMAL HIGH (ref 70–99)
Glucose, Bld: 131 mg/dL — ABNORMAL HIGH (ref 70–99)
Potassium: 4.4 mEq/L (ref 3.5–5.1)

## 2010-04-20 LAB — URINALYSIS, ROUTINE W REFLEX MICROSCOPIC
Bilirubin Urine: NEGATIVE
Glucose, UA: NEGATIVE mg/dL
Ketones, ur: NEGATIVE mg/dL
Leukocytes, UA: NEGATIVE
Nitrite: NEGATIVE
Protein, ur: NEGATIVE mg/dL
Protein, ur: 100 mg/dL — AB
Urobilinogen, UA: 1 mg/dL (ref 0.0–1.0)

## 2010-04-20 LAB — DIFFERENTIAL
Basophils Absolute: 0 10*3/uL (ref 0.0–0.1)
Basophils Absolute: 0.1 10*3/uL (ref 0.0–0.1)
Basophils Absolute: 0 10*3/uL (ref 0.0–0.1)
Basophils Absolute: 0.1 10*3/uL (ref 0.0–0.1)
Basophils Relative: 0 % (ref 0–1)
Basophils Relative: 0 % (ref 0–1)
Eosinophils Absolute: 0 10*3/uL (ref 0.0–0.7)
Eosinophils Relative: 0 % (ref 0–5)
Eosinophils Relative: 0 % (ref 0–5)
Eosinophils Relative: 0 % (ref 0–5)
Lymphocytes Relative: 10 % — ABNORMAL LOW (ref 12–46)
Lymphocytes Relative: 12 % (ref 12–46)
Lymphocytes Relative: 8 % — ABNORMAL LOW (ref 12–46)
Lymphocytes Relative: 8 % — ABNORMAL LOW (ref 12–46)
Lymphocytes Relative: 5 % — ABNORMAL LOW (ref 12–46)
Lymphs Abs: 1.6 10*3/uL (ref 0.7–4.0)
Lymphs Abs: 2.4 10*3/uL (ref 0.7–4.0)
Monocytes Absolute: 0.9 10*3/uL (ref 0.1–1.0)
Monocytes Absolute: 1.9 10*3/uL — ABNORMAL HIGH (ref 0.1–1.0)
Monocytes Absolute: 0.8 10*3/uL (ref 0.1–1.0)
Monocytes Relative: 9 % (ref 3–12)
Neutro Abs: 12.5 10*3/uL — ABNORMAL HIGH (ref 1.7–7.7)
Neutro Abs: 16.7 10*3/uL — ABNORMAL HIGH (ref 1.7–7.7)
Neutro Abs: 17.5 10*3/uL — ABNORMAL HIGH (ref 1.7–7.7)
Neutro Abs: 10.3 10*3/uL — ABNORMAL HIGH (ref 1.7–7.7)
Neutro Abs: 20.4 10*3/uL — ABNORMAL HIGH (ref 1.7–7.7)
Neutrophils Relative %: 81 % — ABNORMAL HIGH (ref 43–77)
Neutrophils Relative %: 89 % — ABNORMAL HIGH (ref 43–77)

## 2010-04-20 LAB — CBC
HCT: 26.2 % — ABNORMAL LOW (ref 39.0–52.0)
HCT: 27.6 % — ABNORMAL LOW (ref 39.0–52.0)
HCT: 35.2 % — ABNORMAL LOW (ref 39.0–52.0)
HCT: 38.5 % — ABNORMAL LOW (ref 39.0–52.0)
Hemoglobin: 8.9 g/dL — ABNORMAL LOW (ref 13.0–17.0)
Hemoglobin: 10.8 g/dL — ABNORMAL LOW (ref 13.0–17.0)
MCH: 27.8 pg (ref 26.0–34.0)
MCH: 27.9 pg (ref 26.0–34.0)
MCHC: 32.6 g/dL (ref 30.0–36.0)
MCHC: 30.7 g/dL (ref 30.0–36.0)
MCHC: 32.4 g/dL (ref 30.0–36.0)
MCV: 86.1 fL (ref 78.0–100.0)
MCV: 86.3 fL (ref 78.0–100.0)
Platelets: 271 10*3/uL (ref 150–400)
Platelets: 305 10*3/uL (ref 150–400)
Platelets: 121 10*3/uL — ABNORMAL LOW (ref 150–400)
RBC: 3.04 MIL/uL — ABNORMAL LOW (ref 4.22–5.81)
RBC: 3.53 MIL/uL — ABNORMAL LOW (ref 4.22–5.81)
RDW: 19.7 % — ABNORMAL HIGH (ref 11.5–15.5)
RDW: 20.7 % — ABNORMAL HIGH (ref 11.5–15.5)
RDW: 20.9 % — ABNORMAL HIGH (ref 11.5–15.5)
RDW: 20 % — ABNORMAL HIGH (ref 11.5–15.5)
WBC: 16.2 10*3/uL — ABNORMAL HIGH (ref 4.0–10.5)
WBC: 20.6 10*3/uL — ABNORMAL HIGH (ref 4.0–10.5)
WBC: 21 10*3/uL — ABNORMAL HIGH (ref 4.0–10.5)

## 2010-04-20 LAB — URINE MICROSCOPIC-ADD ON

## 2010-04-20 LAB — COMPREHENSIVE METABOLIC PANEL
ALT: 12 U/L (ref 0–53)
Alkaline Phosphatase: 61 U/L (ref 39–117)
CO2: 23 mEq/L (ref 19–32)
Calcium: 9 mg/dL (ref 8.4–10.5)
GFR calc non Af Amer: 27 mL/min — ABNORMAL LOW (ref >60)
Glucose, Bld: 119 mg/dL — ABNORMAL HIGH (ref 70–99)
Potassium: 4.5 mEq/L (ref 3.5–5.1)
Sodium: 136 mEq/L (ref 135–145)
Total Bilirubin: 0.8 mg/dL (ref 0.3–1.2)

## 2010-04-20 LAB — FECAL LACTOFERRIN, QUANT: Fecal Lactoferrin: POSITIVE

## 2010-04-20 LAB — URINE CULTURE: Colony Count: 6000

## 2010-04-20 LAB — RENAL FUNCTION PANEL
Albumin: 2.6 g/dL — ABNORMAL LOW (ref 3.5–5.2)
Albumin: 2.8 g/dL — ABNORMAL LOW (ref 3.5–5.2)
Calcium: 8.6 mg/dL (ref 8.4–10.5)
Chloride: 111 mEq/L (ref 96–112)
Creatinine, Ser: 2.32 mg/dL — ABNORMAL HIGH (ref 0.4–1.5)
GFR calc Af Amer: 35 mL/min — ABNORMAL LOW (ref >60)
GFR calc Af Amer: 38 mL/min — ABNORMAL LOW (ref >60)
GFR calc non Af Amer: 29 mL/min — ABNORMAL LOW (ref >60)
GFR calc non Af Amer: 31 mL/min — ABNORMAL LOW (ref >60)
Phosphorus: 2.1 mg/dL — ABNORMAL LOW (ref 2.3–4.6)
Potassium: 3.9 mEq/L (ref 3.5–5.1)
Potassium: 4 mEq/L (ref 3.5–5.1)
Sodium: 135 mEq/L (ref 135–145)
Sodium: 138 mEq/L (ref 135–145)

## 2010-04-20 LAB — MAGNESIUM: Magnesium: 1.6 mg/dL (ref 1.5–2.5)

## 2010-04-20 LAB — PROTIME-INR
INR: 2.69 — ABNORMAL HIGH (ref 0.00–1.49)
INR: 4.19 — ABNORMAL HIGH (ref 0.00–1.49)
Prothrombin Time: 31.6 seconds — ABNORMAL HIGH (ref 11.6–15.2)
Prothrombin Time: 40.1 seconds — ABNORMAL HIGH (ref 11.6–15.2)

## 2010-04-20 LAB — CLOSTRIDIUM DIFFICILE EIA

## 2010-04-20 IMAGING — CR DG HAND 3V BILAT
6 series · 6 of 6 positions shown · non-contrast
Comparison: 08/31/2006

CLINICAL DATA: Swelling and pain in the hands.

BILATERAL HAND - 6 VIEW (three of each hand, obtained separately)

[view not recorded (1 of 6)]
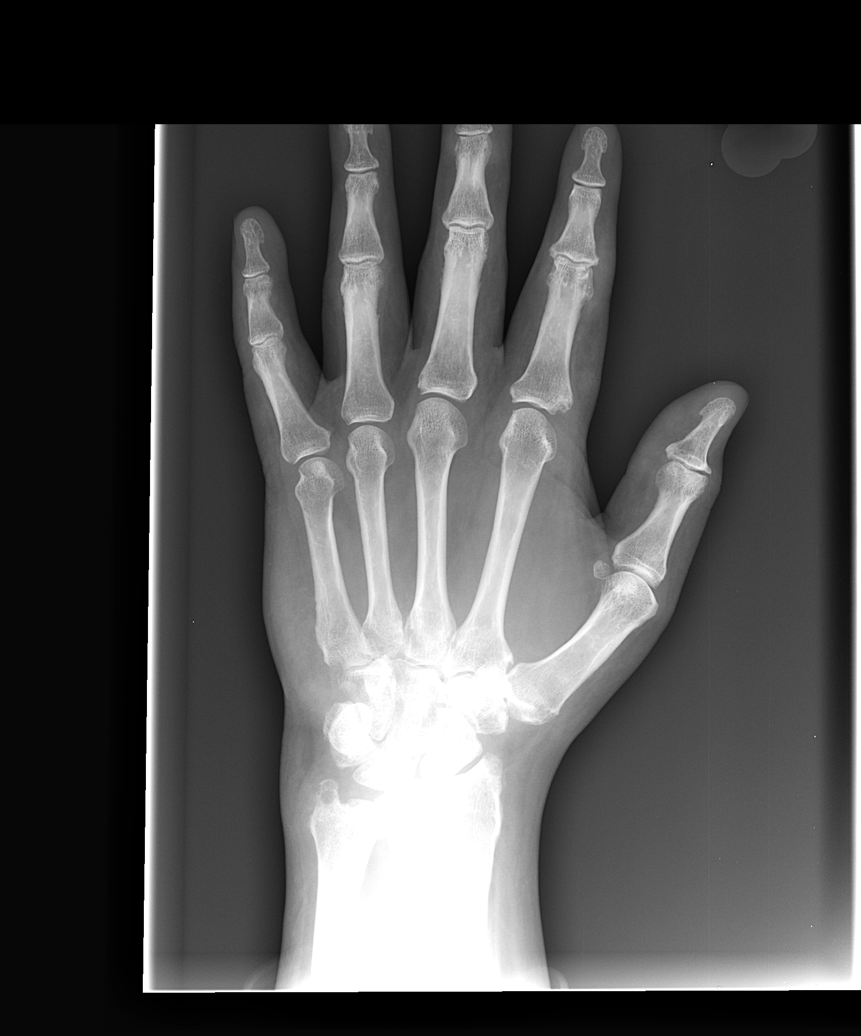

[view not recorded (2 of 6)]
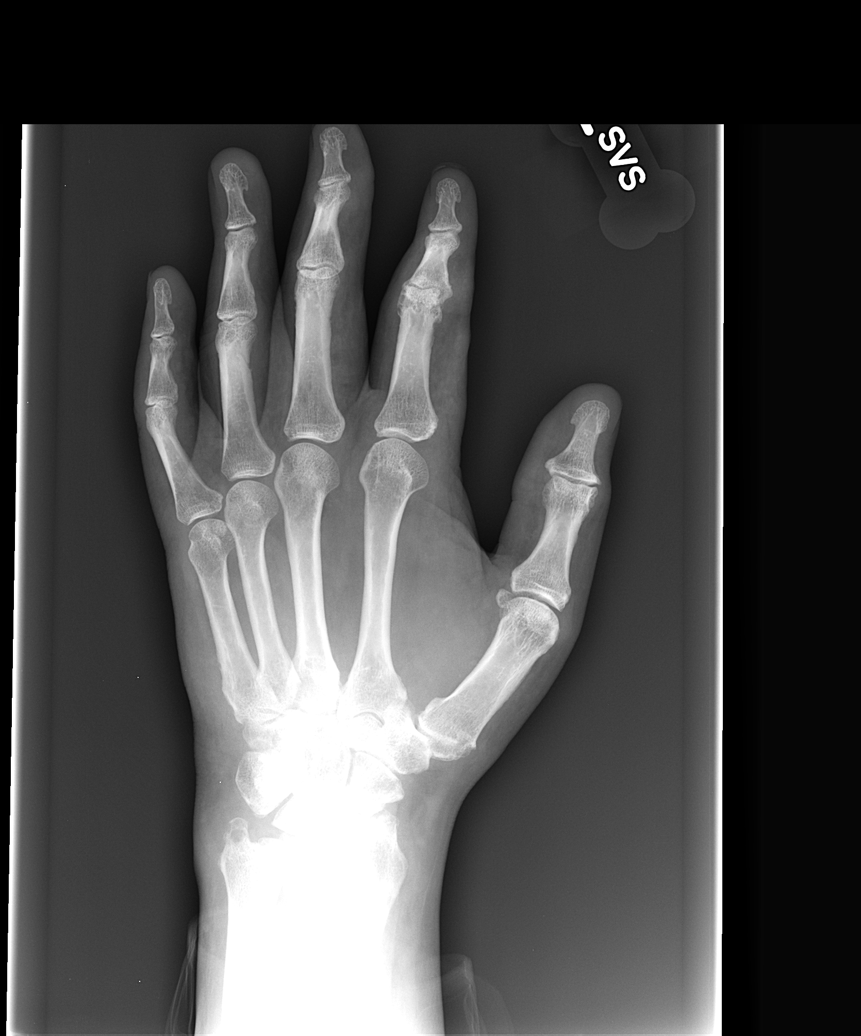

[view not recorded (3 of 6)]
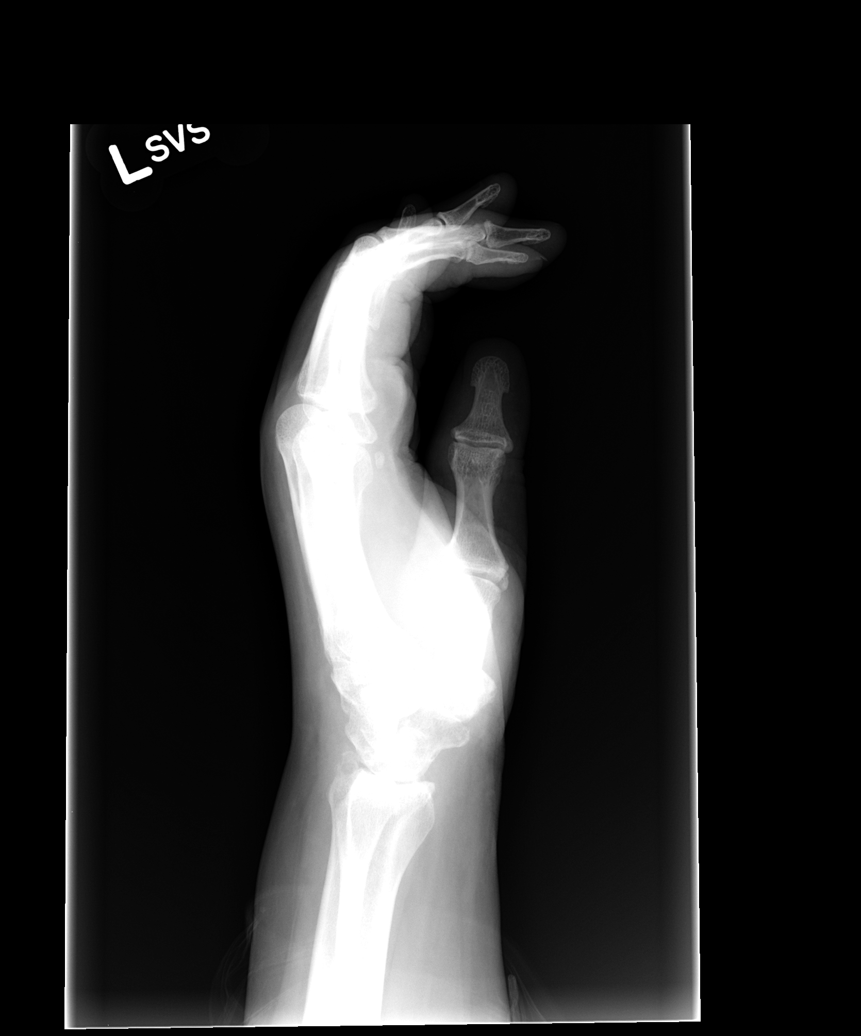

[view not recorded (4 of 6)]
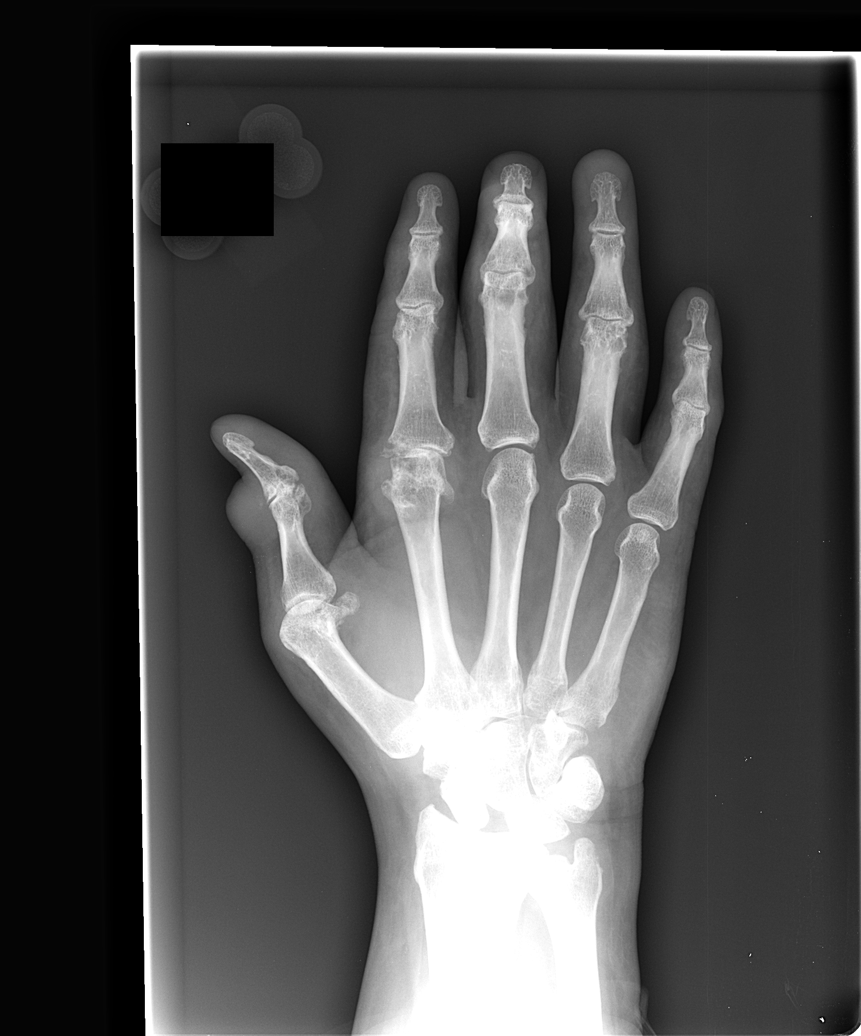

[view not recorded (5 of 6)]
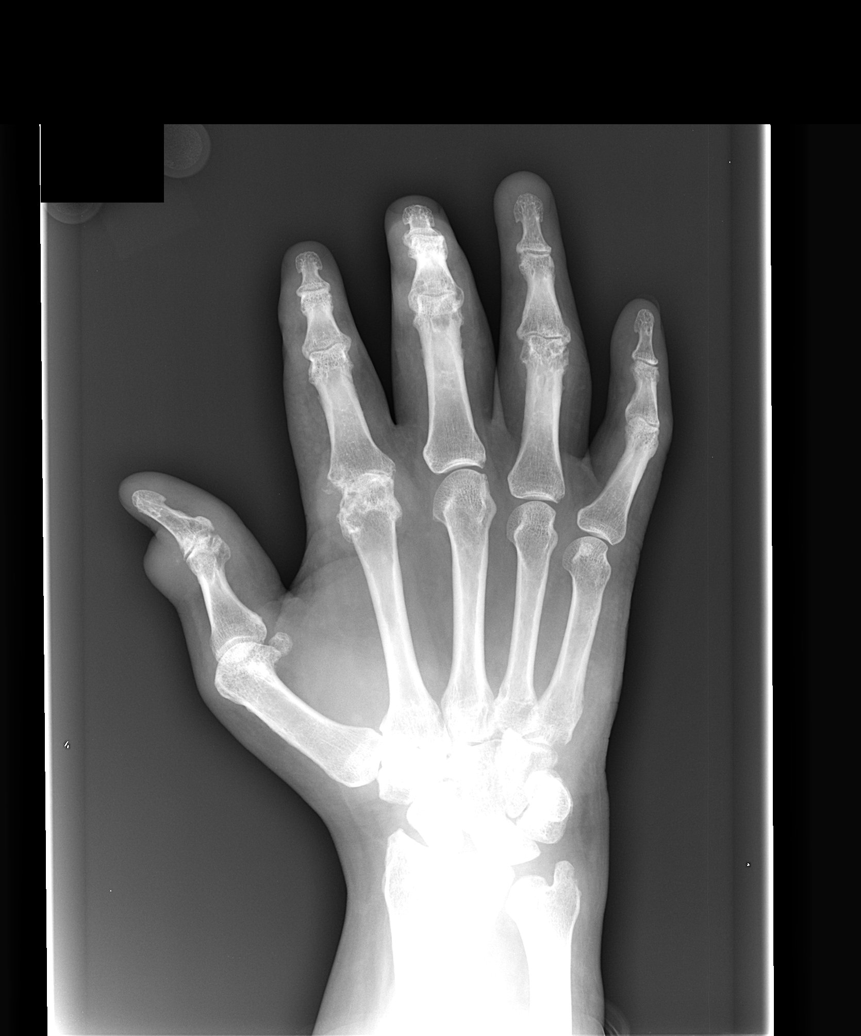

[view not recorded (6 of 6)]
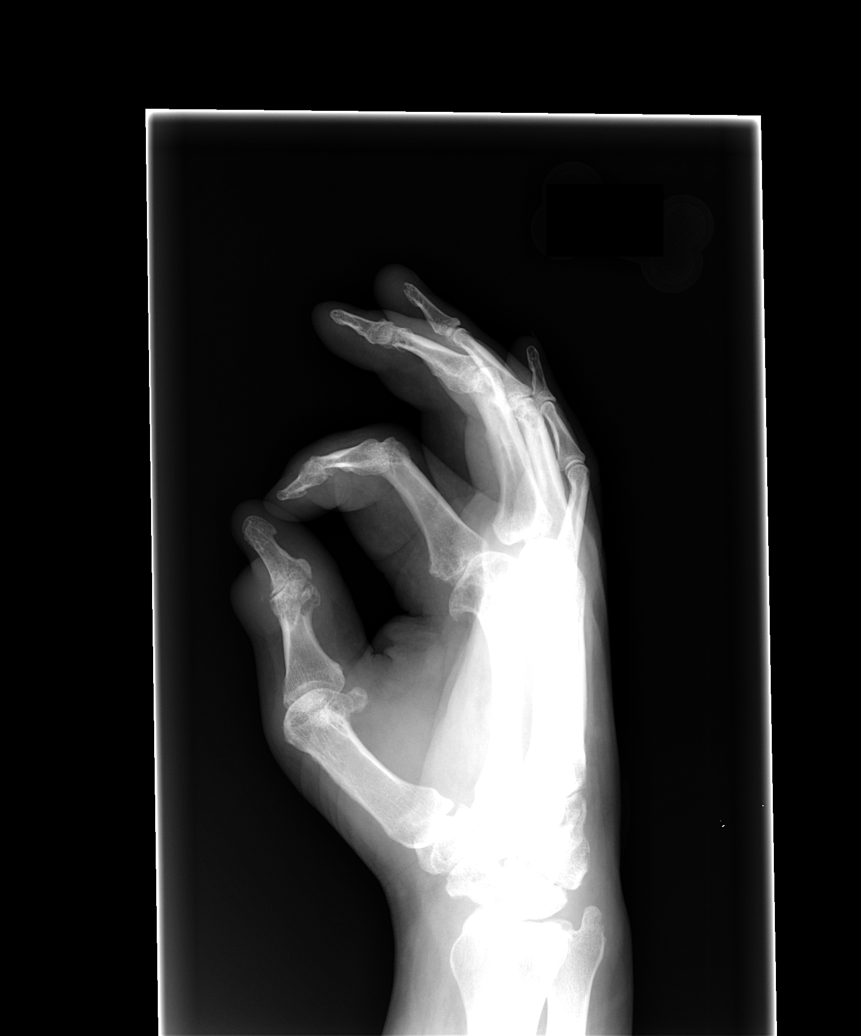

[6 of 6 positions shown; findings below may reference images not displayed]

FINDINGS: Left hand: Marginal erosions noted with some periarticular swelling
as well as an erosion of the ulnar styloid PICC.  No definite soft
tissue calcification.  There is an erosion at the articulation
between the hamate and the base of the fifth manner carpal, and
some spur like changes along the erosions most compatible with
gouty arthropathy.

Right hand:  Worsening arthropathy at the second metacarpal
phalangeal joint noted with prominent spurring and loss of
articular space, and marginal erosions, compatible with gouty
arthropathy and progressive articular irregularity.

There is abnormal widening of the scapholunate joint which is new
and compatible with scapholunate ligament tear.  This may
eventually predispose to slack wrist.

Progressive erosions are also noted in several other joints of the
hand.
IMPRESSION: Left hand:
1.  Minimal worsening of gouty arthropathy.

Right hand:
1.  Progressive gouty arthropathy in the right hand.
2.  Scapholunate widening compatible with scapholunate ligament
tear.

## 2010-04-21 LAB — CROSSMATCH
ABO/RH(D): A POS
Antibody Screen: NEGATIVE

## 2010-04-21 LAB — FERRITIN
Ferritin: 548 ng/mL — ABNORMAL HIGH (ref 22–322)
Ferritin: 1033 ng/mL — ABNORMAL HIGH (ref 22–322)

## 2010-04-21 LAB — DIFFERENTIAL
Band Neutrophils: 5 % (ref 0–10)
Basophils Absolute: 0 10*3/uL (ref 0.0–0.1)
Basophils Absolute: 0 10*3/uL (ref 0.0–0.1)
Basophils Absolute: 0 10*3/uL (ref 0.0–0.1)
Basophils Absolute: 0 10*3/uL (ref 0.0–0.1)
Basophils Absolute: 0.1 10*3/uL (ref 0.0–0.1)
Basophils Absolute: 0 10*3/uL (ref 0.0–0.1)
Basophils Absolute: 0 10*3/uL (ref 0.0–0.1)
Basophils Absolute: 0 10*3/uL (ref 0.0–0.1)
Basophils Absolute: 0 10*3/uL (ref 0.0–0.1)
Basophils Absolute: 0 10*3/uL (ref 0.0–0.1)
Basophils Absolute: 0.1 10*3/uL (ref 0.0–0.1)
Basophils Absolute: 0 10*3/uL (ref 0.0–0.1)
Basophils Absolute: 0 10*3/uL (ref 0.0–0.1)
Basophils Relative: 0 % (ref 0–1)
Basophils Relative: 1 % (ref 0–1)
Basophils Relative: 0 % (ref 0–1)
Basophils Relative: 0 % (ref 0–1)
Basophils Relative: 0 % (ref 0–1)
Basophils Relative: 0 % (ref 0–1)
Basophils Relative: 0 % (ref 0–1)
Basophils Relative: 0 % (ref 0–1)
Basophils Relative: 0 % (ref 0–1)
Basophils Relative: 0 % (ref 0–1)
Blasts: 0 %
Blasts: 0 %
Eosinophils Absolute: 0 10*3/uL (ref 0.0–0.7)
Eosinophils Absolute: 0 10*3/uL (ref 0.0–0.7)
Eosinophils Absolute: 0 10*3/uL (ref 0.0–0.7)
Eosinophils Absolute: 0 10*3/uL (ref 0.0–0.7)
Eosinophils Absolute: 0 10*3/uL (ref 0.0–0.7)
Eosinophils Absolute: 0 10*3/uL (ref 0.0–0.7)
Eosinophils Absolute: 0 10*3/uL (ref 0.0–0.7)
Eosinophils Absolute: 0 10*3/uL (ref 0.0–0.7)
Eosinophils Absolute: 0 10*3/uL (ref 0.0–0.7)
Eosinophils Absolute: 0 10*3/uL (ref 0.0–0.7)
Eosinophils Absolute: 0 10*3/uL (ref 0.0–0.7)
Eosinophils Absolute: 0 10*3/uL (ref 0.0–0.7)
Eosinophils Absolute: 0.2 10*3/uL (ref 0.0–0.7)
Eosinophils Relative: 0 % (ref 0–5)
Eosinophils Relative: 0 % (ref 0–5)
Eosinophils Relative: 0 % (ref 0–5)
Eosinophils Relative: 0 % (ref 0–5)
Eosinophils Relative: 0 % (ref 0–5)
Eosinophils Relative: 0 % (ref 0–5)
Eosinophils Relative: 0 % (ref 0–5)
Eosinophils Relative: 0 % (ref 0–5)
Eosinophils Relative: 0 % (ref 0–5)
Eosinophils Relative: 0 % (ref 0–5)
Eosinophils Relative: 0 % (ref 0–5)
Eosinophils Relative: 1 % (ref 0–5)
Eosinophils Relative: 0 % (ref 0–5)
Eosinophils Relative: 0 % (ref 0–5)
Eosinophils Relative: 0 % (ref 0–5)
Lymphocytes Relative: 10 % — ABNORMAL LOW (ref 12–46)
Lymphocytes Relative: 22 % (ref 12–46)
Lymphocytes Relative: 8 % — ABNORMAL LOW (ref 12–46)
Lymphocytes Relative: 2 % — ABNORMAL LOW (ref 12–46)
Lymphocytes Relative: 3 % — ABNORMAL LOW (ref 12–46)
Lymphocytes Relative: 3 % — ABNORMAL LOW (ref 12–46)
Lymphocytes Relative: 3 % — ABNORMAL LOW (ref 12–46)
Lymphocytes Relative: 3 % — ABNORMAL LOW (ref 12–46)
Lymphocytes Relative: 4 % — ABNORMAL LOW (ref 12–46)
Lymphocytes Relative: 4 % — ABNORMAL LOW (ref 12–46)
Lymphocytes Relative: 6 % — ABNORMAL LOW (ref 12–46)
Lymphocytes Relative: 6 % — ABNORMAL LOW (ref 12–46)
Lymphocytes Relative: 6 % — ABNORMAL LOW (ref 12–46)
Lymphocytes Relative: 8 % — ABNORMAL LOW (ref 12–46)
Lymphs Abs: 0.9 10*3/uL (ref 0.7–4.0)
Lymphs Abs: 0.8 10*3/uL (ref 0.7–4.0)
Lymphs Abs: 1.2 10*3/uL (ref 0.7–4.0)
Lymphs Abs: 1.2 10*3/uL (ref 0.7–4.0)
Lymphs Abs: 1.2 10*3/uL (ref 0.7–4.0)
Lymphs Abs: 1.4 10*3/uL (ref 0.7–4.0)
Lymphs Abs: 1.4 10*3/uL (ref 0.7–4.0)
Lymphs Abs: 1.5 10*3/uL (ref 0.7–4.0)
Lymphs Abs: 1.9 10*3/uL (ref 0.7–4.0)
Lymphs Abs: 1 10*3/uL (ref 0.7–4.0)
Lymphs Abs: 1.4 10*3/uL (ref 0.7–4.0)
Lymphs Abs: 2 10*3/uL (ref 0.7–4.0)
Metamyelocytes Relative: 0 %
Monocytes Absolute: 0.7 10*3/uL (ref 0.1–1.0)
Monocytes Absolute: 0.3 10*3/uL (ref 0.1–1.0)
Monocytes Absolute: 0.5 10*3/uL (ref 0.1–1.0)
Monocytes Absolute: 0.7 10*3/uL (ref 0.1–1.0)
Monocytes Absolute: 1 10*3/uL (ref 0.1–1.0)
Monocytes Absolute: 1.2 10*3/uL — ABNORMAL HIGH (ref 0.1–1.0)
Monocytes Absolute: 1.4 10*3/uL — ABNORMAL HIGH (ref 0.1–1.0)
Monocytes Absolute: 1.5 10*3/uL — ABNORMAL HIGH (ref 0.1–1.0)
Monocytes Absolute: 3.9 10*3/uL — ABNORMAL HIGH (ref 0.1–1.0)
Monocytes Absolute: 4.8 10*3/uL — ABNORMAL HIGH (ref 0.1–1.0)
Monocytes Absolute: 0.4 10*3/uL (ref 0.1–1.0)
Monocytes Absolute: 1.3 10*3/uL — ABNORMAL HIGH (ref 0.1–1.0)
Monocytes Absolute: 1.5 10*3/uL — ABNORMAL HIGH (ref 0.1–1.0)
Monocytes Relative: 6 % (ref 3–12)
Monocytes Relative: 7 % (ref 3–12)
Monocytes Relative: 1 % — ABNORMAL LOW (ref 3–12)
Monocytes Relative: 12 % (ref 3–12)
Monocytes Relative: 2 % — ABNORMAL LOW (ref 3–12)
Monocytes Relative: 3 % (ref 3–12)
Monocytes Relative: 3 % (ref 3–12)
Monocytes Relative: 3 % (ref 3–12)
Monocytes Relative: 4 % (ref 3–12)
Monocytes Relative: 7 % (ref 3–12)
Monocytes Relative: 5 % (ref 3–12)
Monocytes Relative: 5 % (ref 3–12)
Monocytes Relative: 5 % (ref 3–12)
Myelocytes: 1 %
Neutro Abs: 10.2 10*3/uL — ABNORMAL HIGH (ref 1.7–7.7)
Neutro Abs: 6.6 10*3/uL (ref 1.7–7.7)
Neutro Abs: 8.6 10*3/uL — ABNORMAL HIGH (ref 1.7–7.7)
Neutro Abs: 24.1 10*3/uL — ABNORMAL HIGH (ref 1.7–7.7)
Neutro Abs: 38.3 10*3/uL — ABNORMAL HIGH (ref 1.7–7.7)
Neutro Abs: 42 10*3/uL — ABNORMAL HIGH (ref 1.7–7.7)
Neutro Abs: 43.9 10*3/uL — ABNORMAL HIGH (ref 1.7–7.7)
Neutro Abs: 48.4 10*3/uL — ABNORMAL HIGH (ref 1.7–7.7)
Neutrophils Relative %: 63 % (ref 43–77)
Neutrophils Relative %: 86 % — ABNORMAL HIGH (ref 43–77)
Neutrophils Relative %: 87 % — ABNORMAL HIGH (ref 43–77)
Neutrophils Relative %: 93 % — ABNORMAL HIGH (ref 43–77)
Neutrophils Relative %: 89 % — ABNORMAL HIGH (ref 43–77)
Neutrophils Relative %: 90 % — ABNORMAL HIGH (ref 43–77)
Neutrophils Relative %: 91 % — ABNORMAL HIGH (ref 43–77)
Neutrophils Relative %: 93 % — ABNORMAL HIGH (ref 43–77)
Neutrophils Relative %: 94 % — ABNORMAL HIGH (ref 43–77)
Neutrophils Relative %: 86 % — ABNORMAL HIGH (ref 43–77)
Promyelocytes Absolute: 0 %
WBC Morphology: INCREASED
nRBC: 0 /100 WBC

## 2010-04-21 LAB — CBC
HCT: 21.9 % — ABNORMAL LOW (ref 39.0–52.0)
HCT: 30.1 % — ABNORMAL LOW (ref 39.0–52.0)
HCT: 26.7 % — ABNORMAL LOW (ref 39.0–52.0)
HCT: 26.9 % — ABNORMAL LOW (ref 39.0–52.0)
HCT: 27.1 % — ABNORMAL LOW (ref 39.0–52.0)
HCT: 28.1 % — ABNORMAL LOW (ref 39.0–52.0)
HCT: 28.7 % — ABNORMAL LOW (ref 39.0–52.0)
HCT: 30.5 % — ABNORMAL LOW (ref 39.0–52.0)
HCT: 30.6 % — ABNORMAL LOW (ref 39.0–52.0)
HCT: 30.7 % — ABNORMAL LOW (ref 39.0–52.0)
HCT: 31.5 % — ABNORMAL LOW (ref 39.0–52.0)
HCT: 31.7 % — ABNORMAL LOW (ref 39.0–52.0)
HCT: 25.4 % — ABNORMAL LOW (ref 39.0–52.0)
HCT: 26.5 % — ABNORMAL LOW (ref 39.0–52.0)
HCT: 27.8 % — ABNORMAL LOW (ref 39.0–52.0)
Hemoglobin: 7.2 g/dL — ABNORMAL LOW (ref 13.0–17.0)
Hemoglobin: 10 g/dL — ABNORMAL LOW (ref 13.0–17.0)
Hemoglobin: 10.4 g/dL — ABNORMAL LOW (ref 13.0–17.0)
Hemoglobin: 10.4 g/dL — ABNORMAL LOW (ref 13.0–17.0)
Hemoglobin: 8.7 g/dL — ABNORMAL LOW (ref 13.0–17.0)
Hemoglobin: 8.7 g/dL — ABNORMAL LOW (ref 13.0–17.0)
Hemoglobin: 8.7 g/dL — ABNORMAL LOW (ref 13.0–17.0)
Hemoglobin: 9 g/dL — ABNORMAL LOW (ref 13.0–17.0)
Hemoglobin: 9.1 g/dL — ABNORMAL LOW (ref 13.0–17.0)
Hemoglobin: 9.4 g/dL — ABNORMAL LOW (ref 13.0–17.0)
Hemoglobin: 8.4 g/dL — ABNORMAL LOW (ref 13.0–17.0)
Hemoglobin: 8.6 g/dL — ABNORMAL LOW (ref 13.0–17.0)
Hemoglobin: 9.1 g/dL — ABNORMAL LOW (ref 13.0–17.0)
MCH: 27.3 pg (ref 26.0–34.0)
MCH: 27.7 pg (ref 26.0–34.0)
MCHC: 32.4 g/dL (ref 30.0–36.0)
MCHC: 32.8 g/dL (ref 30.0–36.0)
MCHC: 32 g/dL (ref 30.0–36.0)
MCHC: 32.1 g/dL (ref 30.0–36.0)
MCHC: 32.2 g/dL (ref 30.0–36.0)
MCHC: 32.2 g/dL (ref 30.0–36.0)
MCHC: 32.5 g/dL (ref 30.0–36.0)
MCHC: 32.5 g/dL (ref 30.0–36.0)
MCHC: 32.5 g/dL (ref 30.0–36.0)
MCHC: 32.6 g/dL (ref 30.0–36.0)
MCHC: 32.6 g/dL (ref 30.0–36.0)
MCHC: 32.7 g/dL (ref 30.0–36.0)
MCHC: 32.8 g/dL (ref 30.0–36.0)
MCHC: 32.7 g/dL (ref 30.0–36.0)
MCV: 83.6 fL (ref 78.0–100.0)
MCV: 85 fL (ref 78.0–100.0)
MCV: 85.3 fL (ref 78.0–100.0)
MCV: 84.5 fL (ref 78.0–100.0)
MCV: 84.6 fL (ref 78.0–100.0)
MCV: 85.2 fL (ref 78.0–100.0)
MCV: 85.2 fL (ref 78.0–100.0)
MCV: 85.4 fL (ref 78.0–100.0)
MCV: 85.5 fL (ref 78.0–100.0)
MCV: 86.2 fL (ref 78.0–100.0)
MCV: 87.4 fL (ref 78.0–100.0)
MCV: 84.8 fL (ref 78.0–100.0)
Platelets: 260 10*3/uL (ref 150–400)
Platelets: 267 10*3/uL (ref 150–400)
Platelets: 270 10*3/uL (ref 150–400)
Platelets: 273 10*3/uL (ref 150–400)
Platelets: 209 10*3/uL (ref 150–400)
Platelets: 270 10*3/uL (ref 150–400)
Platelets: 307 10*3/uL (ref 150–400)
Platelets: 308 10*3/uL (ref 150–400)
Platelets: 319 10*3/uL (ref 150–400)
Platelets: 343 10*3/uL (ref 150–400)
Platelets: 354 10*3/uL (ref 150–400)
Platelets: 447 10*3/uL — ABNORMAL HIGH (ref 150–400)
RBC: 2.57 MIL/uL — ABNORMAL LOW (ref 4.22–5.81)
RBC: 2.95 MIL/uL — ABNORMAL LOW (ref 4.22–5.81)
RBC: 2.98 MIL/uL — ABNORMAL LOW (ref 4.22–5.81)
RBC: 3.08 MIL/uL — ABNORMAL LOW (ref 4.22–5.81)
RBC: 3.15 MIL/uL — ABNORMAL LOW (ref 4.22–5.81)
RBC: 3.25 MIL/uL — ABNORMAL LOW (ref 4.22–5.81)
RBC: 3.28 MIL/uL — ABNORMAL LOW (ref 4.22–5.81)
RBC: 3.29 MIL/uL — ABNORMAL LOW (ref 4.22–5.81)
RBC: 3.4 MIL/uL — ABNORMAL LOW (ref 4.22–5.81)
RBC: 3.42 MIL/uL — ABNORMAL LOW (ref 4.22–5.81)
RBC: 3.54 MIL/uL — ABNORMAL LOW (ref 4.22–5.81)
RBC: 3.72 MIL/uL — ABNORMAL LOW (ref 4.22–5.81)
RBC: 3.75 MIL/uL — ABNORMAL LOW (ref 4.22–5.81)
RBC: 3.07 MIL/uL — ABNORMAL LOW (ref 4.22–5.81)
RBC: 3.17 MIL/uL — ABNORMAL LOW (ref 4.22–5.81)
RBC: 4.06 MIL/uL — ABNORMAL LOW (ref 4.22–5.81)
RDW: 19.4 % — ABNORMAL HIGH (ref 11.5–15.5)
RDW: 21.6 % — ABNORMAL HIGH (ref 11.5–15.5)
RDW: 22 % — ABNORMAL HIGH (ref 11.5–15.5)
RDW: 22.2 % — ABNORMAL HIGH (ref 11.5–15.5)
RDW: 17.3 % — ABNORMAL HIGH (ref 11.5–15.5)
RDW: 17.4 % — ABNORMAL HIGH (ref 11.5–15.5)
RDW: 17.4 % — ABNORMAL HIGH (ref 11.5–15.5)
RDW: 17.7 % — ABNORMAL HIGH (ref 11.5–15.5)
RDW: 17.8 % — ABNORMAL HIGH (ref 11.5–15.5)
RDW: 17.8 % — ABNORMAL HIGH (ref 11.5–15.5)
RDW: 17.9 % — ABNORMAL HIGH (ref 11.5–15.5)
RDW: 18.3 % — ABNORMAL HIGH (ref 11.5–15.5)
RDW: 18.4 % — ABNORMAL HIGH (ref 11.5–15.5)
RDW: 20.2 % — ABNORMAL HIGH (ref 11.5–15.5)
RDW: 20.7 % — ABNORMAL HIGH (ref 11.5–15.5)
WBC: 10.4 10*3/uL (ref 4.0–10.5)
WBC: 13.2 10*3/uL — ABNORMAL HIGH (ref 4.0–10.5)
WBC: 9.9 10*3/uL (ref 4.0–10.5)
WBC: 27.4 10*3/uL — ABNORMAL HIGH (ref 4.0–10.5)
WBC: 28.1 10*3/uL — ABNORMAL HIGH (ref 4.0–10.5)
WBC: 32.4 10*3/uL — ABNORMAL HIGH (ref 4.0–10.5)
WBC: 34.9 10*3/uL — ABNORMAL HIGH (ref 4.0–10.5)
WBC: 40.7 10*3/uL — ABNORMAL HIGH (ref 4.0–10.5)
WBC: 51 10*3/uL (ref 4.0–10.5)
WBC: 52.3 10*3/uL (ref 4.0–10.5)
WBC: 17.8 10*3/uL — ABNORMAL HIGH (ref 4.0–10.5)
WBC: 24 10*3/uL — ABNORMAL HIGH (ref 4.0–10.5)

## 2010-04-21 LAB — RENAL FUNCTION PANEL
Albumin: 2.7 g/dL — ABNORMAL LOW (ref 3.5–5.2)
Albumin: 1.7 g/dL — ABNORMAL LOW (ref 3.5–5.2)
Albumin: 1.8 g/dL — ABNORMAL LOW (ref 3.5–5.2)
Albumin: 2.1 g/dL — ABNORMAL LOW (ref 3.5–5.2)
Albumin: 2.2 g/dL — ABNORMAL LOW (ref 3.5–5.2)
Albumin: 2.4 g/dL — ABNORMAL LOW (ref 3.5–5.2)
BUN: 39 mg/dL — ABNORMAL HIGH (ref 6–23)
BUN: 111 mg/dL — ABNORMAL HIGH (ref 6–23)
BUN: 123 mg/dL — ABNORMAL HIGH (ref 6–23)
BUN: 51 mg/dL — ABNORMAL HIGH (ref 6–23)
CO2: 23 mEq/L (ref 19–32)
CO2: 27 mEq/L (ref 19–32)
Calcium: 9.7 mg/dL (ref 8.4–10.5)
Calcium: 8.1 mg/dL — ABNORMAL LOW (ref 8.4–10.5)
Calcium: 9.3 mg/dL (ref 8.4–10.5)
Chloride: 109 mEq/L (ref 96–112)
Chloride: 100 mEq/L (ref 96–112)
Chloride: 102 mEq/L (ref 96–112)
Creatinine, Ser: 3.16 mg/dL — ABNORMAL HIGH (ref 0.4–1.5)
Creatinine, Ser: 3.78 mg/dL — ABNORMAL HIGH (ref 0.4–1.5)
GFR calc Af Amer: 28 mL/min — ABNORMAL LOW (ref >60)
GFR calc Af Amer: 21 mL/min — ABNORMAL LOW (ref >60)
GFR calc Af Amer: 21 mL/min — ABNORMAL LOW (ref >60)
GFR calc Af Amer: 26 mL/min — ABNORMAL LOW (ref >60)
GFR calc non Af Amer: 23 mL/min — ABNORMAL LOW (ref >60)
GFR calc non Af Amer: 17 mL/min — ABNORMAL LOW (ref >60)
GFR calc non Af Amer: 18 mL/min — ABNORMAL LOW (ref >60)
GFR calc non Af Amer: 22 mL/min — ABNORMAL LOW (ref >60)
Glucose, Bld: 171 mg/dL — ABNORMAL HIGH (ref 70–99)
Glucose, Bld: 178 mg/dL — ABNORMAL HIGH (ref 70–99)
Glucose, Bld: 213 mg/dL — ABNORMAL HIGH (ref 70–99)
Phosphorus: 2.2 mg/dL — ABNORMAL LOW (ref 2.3–4.6)
Phosphorus: 5 mg/dL — ABNORMAL HIGH (ref 2.3–4.6)
Phosphorus: 5.2 mg/dL — ABNORMAL HIGH (ref 2.3–4.6)
Phosphorus: 5.3 mg/dL — ABNORMAL HIGH (ref 2.3–4.6)
Phosphorus: 6.5 mg/dL — ABNORMAL HIGH (ref 2.3–4.6)
Potassium: 3.7 mEq/L (ref 3.5–5.1)
Potassium: 4.2 mEq/L (ref 3.5–5.1)
Potassium: 4.7 mEq/L (ref 3.5–5.1)
Potassium: 5.1 mEq/L (ref 3.5–5.1)
Sodium: 137 mEq/L (ref 135–145)
Sodium: 138 mEq/L (ref 135–145)
Sodium: 133 mEq/L — ABNORMAL LOW (ref 135–145)
Sodium: 138 mEq/L (ref 135–145)
Sodium: 140 mEq/L (ref 135–145)

## 2010-04-21 LAB — RETICULOCYTES
RBC.: 3.06 MIL/uL — ABNORMAL LOW (ref 4.22–5.81)
RBC.: 3.15 MIL/uL — ABNORMAL LOW (ref 4.22–5.81)
Retic Count, Absolute: 67.3 10*3/uL (ref 19.0–186.0)
Retic Count, Absolute: 50.4 10*3/uL (ref 19.0–186.0)
Retic Ct Pct: 2.2 % (ref 0.4–3.1)
Retic Ct Pct: 1.6 % (ref 0.4–3.1)

## 2010-04-21 LAB — HEMOCCULT GUIAC POC 1CARD (OFFICE)
Fecal Occult Bld: NEGATIVE
Fecal Occult Bld: POSITIVE

## 2010-04-21 LAB — GLUCOSE, CAPILLARY
Glucose-Capillary: 179 mg/dL — ABNORMAL HIGH (ref 70–99)
Glucose-Capillary: 100 mg/dL — ABNORMAL HIGH (ref 70–99)
Glucose-Capillary: 128 mg/dL — ABNORMAL HIGH (ref 70–99)
Glucose-Capillary: 174 mg/dL — ABNORMAL HIGH (ref 70–99)
Glucose-Capillary: 178 mg/dL — ABNORMAL HIGH (ref 70–99)
Glucose-Capillary: 185 mg/dL — ABNORMAL HIGH (ref 70–99)
Glucose-Capillary: 199 mg/dL — ABNORMAL HIGH (ref 70–99)
Glucose-Capillary: 201 mg/dL — ABNORMAL HIGH (ref 70–99)
Glucose-Capillary: 220 mg/dL — ABNORMAL HIGH (ref 70–99)
Glucose-Capillary: 227 mg/dL — ABNORMAL HIGH (ref 70–99)
Glucose-Capillary: 282 mg/dL — ABNORMAL HIGH (ref 70–99)
Glucose-Capillary: 284 mg/dL — ABNORMAL HIGH (ref 70–99)
Glucose-Capillary: 300 mg/dL — ABNORMAL HIGH (ref 70–99)
Glucose-Capillary: 301 mg/dL — ABNORMAL HIGH (ref 70–99)
Glucose-Capillary: 355 mg/dL — ABNORMAL HIGH (ref 70–99)
Glucose-Capillary: 371 mg/dL — ABNORMAL HIGH (ref 70–99)
Glucose-Capillary: 382 mg/dL — ABNORMAL HIGH (ref 70–99)
Glucose-Capillary: 80 mg/dL (ref 70–99)
Glucose-Capillary: 83 mg/dL (ref 70–99)

## 2010-04-21 LAB — LACTIC ACID, PLASMA
Lactic Acid, Venous: 1.8 mmol/L (ref 0.5–2.2)
Lactic Acid, Venous: 2.7 mmol/L — ABNORMAL HIGH (ref 0.5–2.2)

## 2010-04-21 LAB — TSH
TSH: 1.303 u[IU]/mL (ref 0.350–4.500)
TSH: 1.342 u[IU]/mL (ref 0.350–4.500)

## 2010-04-21 LAB — BASIC METABOLIC PANEL
BUN: 31 mg/dL — ABNORMAL HIGH (ref 6–23)
BUN: 34 mg/dL — ABNORMAL HIGH (ref 6–23)
BUN: 117 mg/dL — ABNORMAL HIGH (ref 6–23)
BUN: 118 mg/dL — ABNORMAL HIGH (ref 6–23)
BUN: 121 mg/dL — ABNORMAL HIGH (ref 6–23)
BUN: 52 mg/dL — ABNORMAL HIGH (ref 6–23)
BUN: 60 mg/dL — ABNORMAL HIGH (ref 6–23)
BUN: 43 mg/dL — ABNORMAL HIGH (ref 6–23)
CO2: 23 mEq/L (ref 19–32)
CO2: 22 mEq/L (ref 19–32)
CO2: 23 mEq/L (ref 19–32)
CO2: 23 mEq/L (ref 19–32)
CO2: 23 mEq/L (ref 19–32)
CO2: 24 mEq/L (ref 19–32)
CO2: 25 mEq/L (ref 19–32)
CO2: 25 mEq/L (ref 19–32)
CO2: 27 mEq/L (ref 19–32)
CO2: 22 mEq/L (ref 19–32)
CO2: 23 mEq/L (ref 19–32)
Calcium: 10.4 mg/dL (ref 8.4–10.5)
Calcium: 11.1 mg/dL — ABNORMAL HIGH (ref 8.4–10.5)
Calcium: 8 mg/dL — ABNORMAL LOW (ref 8.4–10.5)
Calcium: 8.1 mg/dL — ABNORMAL LOW (ref 8.4–10.5)
Calcium: 8.2 mg/dL — ABNORMAL LOW (ref 8.4–10.5)
Calcium: 8.9 mg/dL (ref 8.4–10.5)
Calcium: 9 mg/dL (ref 8.4–10.5)
Calcium: 9.6 mg/dL (ref 8.4–10.5)
Chloride: 111 mEq/L (ref 96–112)
Chloride: 104 mEq/L (ref 96–112)
Chloride: 106 mEq/L (ref 96–112)
Chloride: 109 mEq/L (ref 96–112)
Chloride: 88 mEq/L — ABNORMAL LOW (ref 96–112)
Chloride: 90 mEq/L — ABNORMAL LOW (ref 96–112)
Chloride: 92 mEq/L — ABNORMAL LOW (ref 96–112)
Chloride: 97 mEq/L (ref 96–112)
Chloride: 99 mEq/L (ref 96–112)
Chloride: 104 mEq/L (ref 96–112)
Chloride: 108 mEq/L (ref 96–112)
Chloride: 109 mEq/L (ref 96–112)
Creatinine, Ser: 3.11 mg/dL — ABNORMAL HIGH (ref 0.4–1.5)
Creatinine, Ser: 3.41 mg/dL — ABNORMAL HIGH (ref 0.4–1.5)
Creatinine, Ser: 3.09 mg/dL — ABNORMAL HIGH (ref 0.4–1.5)
Creatinine, Ser: 3.18 mg/dL — ABNORMAL HIGH (ref 0.4–1.5)
Creatinine, Ser: 3.21 mg/dL — ABNORMAL HIGH (ref 0.4–1.5)
Creatinine, Ser: 4.18 mg/dL — ABNORMAL HIGH (ref 0.4–1.5)
Creatinine, Ser: 4.37 mg/dL — ABNORMAL HIGH (ref 0.4–1.5)
Creatinine, Ser: 1.98 mg/dL — ABNORMAL HIGH (ref 0.4–1.5)
GFR calc Af Amer: 27 mL/min — ABNORMAL LOW (ref >60)
GFR calc Af Amer: 18 mL/min — ABNORMAL LOW (ref >60)
GFR calc Af Amer: 20 mL/min — ABNORMAL LOW (ref >60)
GFR calc Af Amer: 20 mL/min — ABNORMAL LOW (ref >60)
GFR calc Af Amer: 20 mL/min — ABNORMAL LOW (ref >60)
GFR calc Af Amer: 26 mL/min — ABNORMAL LOW (ref >60)
GFR calc Af Amer: 26 mL/min — ABNORMAL LOW (ref >60)
GFR calc Af Amer: 27 mL/min — ABNORMAL LOW (ref >60)
GFR calc Af Amer: 31 mL/min — ABNORMAL LOW (ref >60)
GFR calc Af Amer: 37 mL/min — ABNORMAL LOW (ref >60)
GFR calc Af Amer: 38 mL/min — ABNORMAL LOW (ref >60)
GFR calc Af Amer: 45 mL/min — ABNORMAL LOW (ref >60)
GFR calc non Af Amer: 20 mL/min — ABNORMAL LOW (ref >60)
GFR calc non Af Amer: 15 mL/min — ABNORMAL LOW (ref >60)
GFR calc non Af Amer: 17 mL/min — ABNORMAL LOW (ref >60)
GFR calc non Af Amer: 17 mL/min — ABNORMAL LOW (ref >60)
GFR calc non Af Amer: 22 mL/min — ABNORMAL LOW (ref >60)
GFR calc non Af Amer: 26 mL/min — ABNORMAL LOW (ref >60)
GFR calc non Af Amer: 31 mL/min — ABNORMAL LOW (ref >60)
GFR calc non Af Amer: 31 mL/min — ABNORMAL LOW (ref >60)
GFR calc non Af Amer: 37 mL/min — ABNORMAL LOW (ref >60)
Glucose, Bld: 109 mg/dL — ABNORMAL HIGH (ref 70–99)
Glucose, Bld: 171 mg/dL — ABNORMAL HIGH (ref 70–99)
Glucose, Bld: 180 mg/dL — ABNORMAL HIGH (ref 70–99)
Glucose, Bld: 222 mg/dL — ABNORMAL HIGH (ref 70–99)
Glucose, Bld: 263 mg/dL — ABNORMAL HIGH (ref 70–99)
Glucose, Bld: 280 mg/dL — ABNORMAL HIGH (ref 70–99)
Glucose, Bld: 285 mg/dL — ABNORMAL HIGH (ref 70–99)
Glucose, Bld: 335 mg/dL — ABNORMAL HIGH (ref 70–99)
Glucose, Bld: 60 mg/dL — ABNORMAL LOW (ref 70–99)
Glucose, Bld: 176 mg/dL — ABNORMAL HIGH (ref 70–99)
Glucose, Bld: 200 mg/dL — ABNORMAL HIGH (ref 70–99)
Glucose, Bld: 211 mg/dL — ABNORMAL HIGH (ref 70–99)
Glucose, Bld: 230 mg/dL — ABNORMAL HIGH (ref 70–99)
Potassium: 5.3 mEq/L — ABNORMAL HIGH (ref 3.5–5.1)
Potassium: 3.7 mEq/L (ref 3.5–5.1)
Potassium: 3.7 mEq/L (ref 3.5–5.1)
Potassium: 3.9 mEq/L (ref 3.5–5.1)
Potassium: 3.9 mEq/L (ref 3.5–5.1)
Potassium: 4.1 mEq/L (ref 3.5–5.1)
Potassium: 4.3 mEq/L (ref 3.5–5.1)
Potassium: 4.5 mEq/L (ref 3.5–5.1)
Potassium: 5.4 mEq/L — ABNORMAL HIGH (ref 3.5–5.1)
Potassium: 5.6 mEq/L — ABNORMAL HIGH (ref 3.5–5.1)
Potassium: 4.5 mEq/L (ref 3.5–5.1)
Potassium: 4.8 mEq/L (ref 3.5–5.1)
Potassium: 5.8 mEq/L — ABNORMAL HIGH (ref 3.5–5.1)
Potassium: 6 mEq/L — ABNORMAL HIGH (ref 3.5–5.1)
Potassium: 4.6 mEq/L (ref 3.5–5.1)
Sodium: 130 mEq/L — ABNORMAL LOW (ref 135–145)
Sodium: 131 mEq/L — ABNORMAL LOW (ref 135–145)
Sodium: 133 mEq/L — ABNORMAL LOW (ref 135–145)
Sodium: 135 mEq/L (ref 135–145)
Sodium: 135 mEq/L (ref 135–145)
Sodium: 135 mEq/L (ref 135–145)
Sodium: 136 mEq/L (ref 135–145)
Sodium: 138 mEq/L (ref 135–145)
Sodium: 139 mEq/L (ref 135–145)
Sodium: 135 mEq/L (ref 135–145)
Sodium: 135 mEq/L (ref 135–145)
Sodium: 138 mEq/L (ref 135–145)

## 2010-04-21 LAB — HEPATIC FUNCTION PANEL
ALT: 13 U/L (ref 0–53)
ALT: 32 U/L (ref 0–53)
AST: 21 U/L (ref 0–37)
AST: 54 U/L — ABNORMAL HIGH (ref 0–37)
Alkaline Phosphatase: 68 U/L (ref 39–117)
Bilirubin, Direct: 0.1 mg/dL (ref 0.0–0.3)
Bilirubin, Direct: 0.3 mg/dL (ref 0.0–0.3)
Indirect Bilirubin: 1.1 mg/dL — ABNORMAL HIGH (ref 0.3–0.9)
Total Bilirubin: 0.8 mg/dL (ref 0.3–1.2)
Total Bilirubin: 0.9 mg/dL (ref 0.3–1.2)
Total Protein: 6.3 g/dL (ref 6.0–8.3)

## 2010-04-21 LAB — COMPREHENSIVE METABOLIC PANEL
Alkaline Phosphatase: 58 U/L (ref 39–117)
BUN: 54 mg/dL — ABNORMAL HIGH (ref 6–23)
Calcium: 9.5 mg/dL (ref 8.4–10.5)
Glucose, Bld: 99 mg/dL (ref 70–99)
Potassium: 6.2 mEq/L — ABNORMAL HIGH (ref 3.5–5.1)
Total Protein: 7.3 g/dL (ref 6.0–8.3)

## 2010-04-21 LAB — UIFE/LIGHT CHAINS/TP QN, 24-HR UR
Free Kappa Lt Chains,Ur: 6.09 mg/dL — ABNORMAL HIGH (ref 0.04–1.51)
Free Kappa/Lambda Ratio: 4.61 ratio — ABNORMAL HIGH (ref 0.46–4.00)
Free Lambda Excretion/Day: 47.85 mg/d
Free Lt Chn Excr Rate: 220.76 mg/d
Gamma Globulin, Urine: DETECTED — AB
Time: 24 hours
Total Protein, Urine-Ur/day: 348 mg/d — ABNORMAL HIGH (ref 10–140)
Volume, Urine: 3625 mL

## 2010-04-21 LAB — PROTIME-INR
INR: 3.71 — ABNORMAL HIGH (ref 0.00–1.49)
INR: 5.85 (ref 0.00–1.49)
INR: 5.95 (ref 0.00–1.49)
INR: 1.62 — ABNORMAL HIGH (ref 0.00–1.49)
INR: 1.73 — ABNORMAL HIGH (ref 0.00–1.49)
INR: 3.51 — ABNORMAL HIGH (ref 0.00–1.49)
INR: 3.57 — ABNORMAL HIGH (ref 0.00–1.49)
INR: 3.63 — ABNORMAL HIGH (ref 0.00–1.49)
INR: 3.7 — ABNORMAL HIGH (ref 0.00–1.49)
INR: 3.86 — ABNORMAL HIGH (ref 0.00–1.49)
INR: 1.67 — ABNORMAL HIGH (ref 0.00–1.49)
INR: 2.22 — ABNORMAL HIGH (ref 0.00–1.49)
Prothrombin Time: 36.5 seconds — ABNORMAL HIGH (ref 11.6–15.2)
Prothrombin Time: 52.1 seconds — ABNORMAL HIGH (ref 11.6–15.2)
Prothrombin Time: 54 seconds — ABNORMAL HIGH (ref 11.6–15.2)
Prothrombin Time: 16.5 seconds — ABNORMAL HIGH (ref 11.6–15.2)
Prothrombin Time: 19.1 seconds — ABNORMAL HIGH (ref 11.6–15.2)
Prothrombin Time: 20.1 seconds — ABNORMAL HIGH (ref 11.6–15.2)
Prothrombin Time: 24.5 seconds — ABNORMAL HIGH (ref 11.6–15.2)
Prothrombin Time: 25.3 seconds — ABNORMAL HIGH (ref 11.6–15.2)
Prothrombin Time: 31 seconds — ABNORMAL HIGH (ref 11.6–15.2)
Prothrombin Time: 35.4 seconds — ABNORMAL HIGH (ref 11.6–15.2)
Prothrombin Time: 19.6 seconds — ABNORMAL HIGH (ref 11.6–15.2)
Prothrombin Time: 24.4 seconds — ABNORMAL HIGH (ref 11.6–15.2)

## 2010-04-21 LAB — CULTURE, ROUTINE-ABSCESS

## 2010-04-21 LAB — HEMOGLOBIN A1C: Hgb A1c MFr Bld: 6.7 % — ABNORMAL HIGH (ref 4.6–6.1)

## 2010-04-21 LAB — URINE CULTURE
Colony Count: 6000
Special Requests: NEGATIVE

## 2010-04-21 LAB — PHOSPHORUS
Phosphorus: 5 mg/dL — ABNORMAL HIGH (ref 2.3–4.6)
Phosphorus: 5.2 mg/dL — ABNORMAL HIGH (ref 2.3–4.6)
Phosphorus: 5.6 mg/dL — ABNORMAL HIGH (ref 2.3–4.6)
Phosphorus: 5.8 mg/dL — ABNORMAL HIGH (ref 2.3–4.6)

## 2010-04-21 LAB — URINALYSIS, ROUTINE W REFLEX MICROSCOPIC
Bilirubin Urine: NEGATIVE
Bilirubin Urine: NEGATIVE
Ketones, ur: NEGATIVE mg/dL
Leukocytes, UA: NEGATIVE
Leukocytes, UA: NEGATIVE
Nitrite: NEGATIVE
Nitrite: NEGATIVE
Nitrite: NEGATIVE
Protein, ur: NEGATIVE mg/dL
Specific Gravity, Urine: 1.02 (ref 1.005–1.030)
Specific Gravity, Urine: 1.025 (ref 1.005–1.030)
Urobilinogen, UA: 0.2 mg/dL (ref 0.0–1.0)
Urobilinogen, UA: 0.2 mg/dL (ref 0.0–1.0)

## 2010-04-21 LAB — POCT CARDIAC MARKERS
CKMB, poc: 2.3 ng/mL (ref 1.0–8.0)
Myoglobin, poc: 203 ng/mL (ref 12–200)

## 2010-04-21 LAB — APTT
aPTT: 120 seconds — ABNORMAL HIGH (ref 24–37)
aPTT: 36 seconds (ref 24–37)
aPTT: 52 seconds — ABNORMAL HIGH (ref 24–37)

## 2010-04-21 LAB — URIC ACID
Uric Acid, Serum: 11.7 mg/dL — ABNORMAL HIGH (ref 4.0–7.8)
Uric Acid, Serum: 10.6 mg/dL — ABNORMAL HIGH (ref 4.0–7.8)

## 2010-04-21 LAB — VITAMIN B12
Vitamin B-12: 281 pg/mL (ref 211–911)
Vitamin B-12: 822 pg/mL (ref 211–911)

## 2010-04-21 LAB — IRON AND TIBC
Iron: 17 ug/dL — ABNORMAL LOW (ref 42–135)
Iron: 18 ug/dL — ABNORMAL LOW (ref 42–135)
TIBC: 164 ug/dL — ABNORMAL LOW (ref 215–435)
UIBC: 135 ug/dL

## 2010-04-21 LAB — CK TOTAL AND CKMB (NOT AT ARMC)
CK, MB: 2.5 ng/mL (ref 0.3–4.0)
Total CK: 122 U/L (ref 7–232)

## 2010-04-21 LAB — URINE MICROSCOPIC-ADD ON

## 2010-04-21 LAB — VANCOMYCIN, TROUGH: Vancomycin Tr: 24.4 ug/mL — ABNORMAL HIGH (ref 10.0–20.0)

## 2010-04-21 LAB — CULTURE, BLOOD (ROUTINE X 2)
Culture: NO GROWTH
Culture: NO GROWTH
Report Status: 7162011
Report Status: 7162011
Report Status: 1222011

## 2010-04-21 LAB — FOLATE RBC: RBC Folate: 506 ng/mL (ref 180–600)

## 2010-04-21 LAB — LIPASE, BLOOD
Lipase: 39 U/L (ref 11–59)
Lipase: 84 U/L — ABNORMAL HIGH (ref 11–59)

## 2010-04-21 LAB — TROPONIN I: Troponin I: 0.03 ng/mL (ref 0.00–0.06)

## 2010-04-21 LAB — ABO/RH: ABO/RH(D): A POS

## 2010-04-21 LAB — T4, FREE: Free T4: 1.17 ng/dL (ref 0.80–1.80)

## 2010-04-21 LAB — C-REACTIVE PROTEIN: CRP: 36.9 mg/dL — ABNORMAL HIGH (ref <0.6)

## 2010-04-21 LAB — HIGH SENSITIVITY CRP: CRP, High Sensitivity: 170 mg/L — ABNORMAL HIGH

## 2010-04-21 LAB — SEDIMENTATION RATE: Sed Rate: 62 mm/hr — ABNORMAL HIGH (ref 0–16)

## 2010-04-21 LAB — FOLATE: Folate: 4.1 ng/mL

## 2010-04-22 LAB — COMPREHENSIVE METABOLIC PANEL
AST: 60 U/L — ABNORMAL HIGH (ref 0–37)
Albumin: 1.8 g/dL — ABNORMAL LOW (ref 3.5–5.2)
Alkaline Phosphatase: 84 U/L (ref 39–117)
Chloride: 109 mEq/L (ref 96–112)
Creatinine, Ser: 2.43 mg/dL — ABNORMAL HIGH (ref 0.4–1.5)
GFR calc Af Amer: 36 mL/min — ABNORMAL LOW (ref >60)
Potassium: 5 mEq/L (ref 3.5–5.1)
Total Bilirubin: 1 mg/dL (ref 0.3–1.2)
Total Protein: 5.5 g/dL — ABNORMAL LOW (ref 6.0–8.3)

## 2010-04-22 LAB — CBC
HCT: 34 % — ABNORMAL LOW (ref 39.0–52.0)
Hemoglobin: 11.1 g/dL — ABNORMAL LOW (ref 13.0–17.0)
MCHC: 32.6 g/dL (ref 30.0–36.0)
MCV: 83.2 fL (ref 78.0–100.0)
Platelets: 289 10*3/uL (ref 150–400)
RBC: 4.09 MIL/uL — ABNORMAL LOW (ref 4.22–5.81)
RDW: 20.6 % — ABNORMAL HIGH (ref 11.5–15.5)
WBC: 14.4 10*3/uL — ABNORMAL HIGH (ref 4.0–10.5)

## 2010-04-22 LAB — BASIC METABOLIC PANEL WITH GFR
BUN: 48 mg/dL — ABNORMAL HIGH (ref 6–23)
CO2: 24 meq/L (ref 19–32)
Calcium: 9.5 mg/dL (ref 8.4–10.5)
Chloride: 102 meq/L (ref 96–112)
Creatinine, Ser: 2.27 mg/dL — ABNORMAL HIGH (ref 0.4–1.5)
GFR calc non Af Amer: 32 mL/min — ABNORMAL LOW
Glucose, Bld: 133 mg/dL — ABNORMAL HIGH (ref 70–99)
Potassium: 4.9 meq/L (ref 3.5–5.1)
Sodium: 140 meq/L (ref 135–145)

## 2010-04-22 LAB — DIFFERENTIAL
Basophils Absolute: 0 10*3/uL (ref 0.0–0.1)
Basophils Relative: 0 % (ref 0–1)
Eosinophils Relative: 1 % (ref 0–5)
Eosinophils Relative: 2 % (ref 0–5)
Lymphocytes Relative: 10 % — ABNORMAL LOW (ref 12–46)
Lymphocytes Relative: 11 % — ABNORMAL LOW (ref 12–46)
Monocytes Absolute: 0.1 10*3/uL (ref 0.1–1.0)
Monocytes Relative: 1 % — ABNORMAL LOW (ref 3–12)
Monocytes Relative: 1 % — ABNORMAL LOW (ref 3–12)
Neutro Abs: 12.4 10*3/uL — ABNORMAL HIGH (ref 1.7–7.7)
Neutro Abs: 14 10*3/uL — ABNORMAL HIGH (ref 1.7–7.7)
Neutrophils Relative %: 88 % — ABNORMAL HIGH (ref 43–77)

## 2010-04-22 LAB — MRSA PCR SCREENING

## 2010-04-22 LAB — PROTIME-INR
INR: 7.82 (ref 0.00–1.49)
INR: 8.43 (ref 0.00–1.49)
INR: 8.95 (ref 0.00–1.49)
Prothrombin Time: 65.3 seconds — ABNORMAL HIGH (ref 11.6–15.2)

## 2010-04-22 LAB — TSH: TSH: 2.714 u[IU]/mL (ref 0.350–4.500)

## 2010-04-22 LAB — APTT: aPTT: 102 seconds — ABNORMAL HIGH (ref 24–37)

## 2010-04-23 ENCOUNTER — Ambulatory Visit (HOSPITAL_COMMUNITY): Payer: BC Managed Care – PPO | Admitting: Physical Therapy

## 2010-04-23 ENCOUNTER — Emergency Department (HOSPITAL_COMMUNITY)
Admission: EM | Admit: 2010-04-23 | Discharge: 2010-04-23 | Disposition: A | Discharge status: Home / Self Care | Payer: BC Managed Care – PPO | Attending: Emergency Medicine | Admitting: Emergency Medicine

## 2010-04-23 ENCOUNTER — Emergency Department (HOSPITAL_COMMUNITY): Payer: BC Managed Care – PPO

## 2010-04-23 DIAGNOSIS — M545 Low back pain, unspecified: Secondary | ICD-10-CM | POA: Insufficient documentation

## 2010-04-23 DIAGNOSIS — I129 Hypertensive chronic kidney disease with stage 1 through stage 4 chronic kidney disease, or unspecified chronic kidney disease: Secondary | ICD-10-CM | POA: Insufficient documentation

## 2010-04-23 DIAGNOSIS — R109 Unspecified abdominal pain: Secondary | ICD-10-CM | POA: Insufficient documentation

## 2010-04-23 DIAGNOSIS — Z86718 Personal history of other venous thrombosis and embolism: Secondary | ICD-10-CM | POA: Insufficient documentation

## 2010-04-23 DIAGNOSIS — IMO0001 Reserved for inherently not codable concepts without codable children: Secondary | ICD-10-CM | POA: Insufficient documentation

## 2010-04-23 DIAGNOSIS — E119 Type 2 diabetes mellitus without complications: Secondary | ICD-10-CM | POA: Insufficient documentation

## 2010-04-23 DIAGNOSIS — K5900 Constipation, unspecified: Secondary | ICD-10-CM | POA: Insufficient documentation

## 2010-04-23 DIAGNOSIS — I10 Essential (primary) hypertension: Secondary | ICD-10-CM | POA: Insufficient documentation

## 2010-04-23 DIAGNOSIS — Z8639 Personal history of other endocrine, nutritional and metabolic disease: Secondary | ICD-10-CM | POA: Insufficient documentation

## 2010-04-23 DIAGNOSIS — Z862 Personal history of diseases of the blood and blood-forming organs and certain disorders involving the immune mechanism: Secondary | ICD-10-CM | POA: Insufficient documentation

## 2010-04-23 DIAGNOSIS — M25549 Pain in joints of unspecified hand: Secondary | ICD-10-CM | POA: Insufficient documentation

## 2010-04-23 DIAGNOSIS — Z79899 Other long term (current) drug therapy: Secondary | ICD-10-CM | POA: Insufficient documentation

## 2010-04-23 DIAGNOSIS — M129 Arthropathy, unspecified: Secondary | ICD-10-CM | POA: Insufficient documentation

## 2010-04-23 DIAGNOSIS — L851 Acquired keratosis [keratoderma] palmaris et plantaris: Secondary | ICD-10-CM | POA: Insufficient documentation

## 2010-04-23 DIAGNOSIS — M109 Gout, unspecified: Secondary | ICD-10-CM | POA: Insufficient documentation

## 2010-04-23 DIAGNOSIS — M25449 Effusion, unspecified hand: Secondary | ICD-10-CM | POA: Insufficient documentation

## 2010-04-23 DIAGNOSIS — N189 Chronic kidney disease, unspecified: Secondary | ICD-10-CM | POA: Insufficient documentation

## 2010-04-24 LAB — DIFFERENTIAL
Basophils Absolute: 0 10*3/uL (ref 0.0–0.1)
Basophils Absolute: 0.1 10*3/uL (ref 0.0–0.1)
Basophils Absolute: 0.1 10*3/uL (ref 0.0–0.1)
Basophils Relative: 0 % (ref 0–1)
Basophils Relative: 0 % (ref 0–1)
Basophils Relative: 3 % — ABNORMAL HIGH (ref 0–1)
Eosinophils Absolute: 0.1 10*3/uL (ref 0.0–0.7)
Eosinophils Relative: 0 % (ref 0–5)
Eosinophils Relative: 1 % (ref 0–5)
Eosinophils Relative: 1 % (ref 0–5)
Eosinophils Relative: 5 % (ref 0–5)
Lymphocytes Relative: 6 % — ABNORMAL LOW (ref 12–46)
Lymphs Abs: 1.1 10*3/uL (ref 0.7–4.0)
Lymphs Abs: 1.8 10*3/uL (ref 0.7–4.0)
Lymphs Abs: 2.4 10*3/uL (ref 0.7–4.0)
Monocytes Absolute: 0.9 10*3/uL (ref 0.1–1.0)
Monocytes Absolute: 1.4 10*3/uL — ABNORMAL HIGH (ref 0.1–1.0)
Monocytes Absolute: 1.3 10*3/uL — ABNORMAL HIGH (ref 0.1–1.0)
Monocytes Relative: 3 % (ref 3–12)
Monocytes Relative: 6 % (ref 3–12)
Monocytes Relative: 11 % (ref 3–12)
Neutro Abs: 26.6 10*3/uL — ABNORMAL HIGH (ref 1.7–7.7)
Neutro Abs: 6.8 10*3/uL (ref 1.7–7.7)
Neutrophils Relative %: 94 % — ABNORMAL HIGH (ref 43–77)

## 2010-04-24 LAB — PROTEIN ELECTROPH W RFLX QUANT IMMUNOGLOBULINS
Alpha-2-Globulin: 18.8 % — ABNORMAL HIGH (ref 7.1–11.8)
Beta 2: 6 % (ref 3.2–6.5)
Beta Globulin: 4.4 % — ABNORMAL LOW (ref 4.7–7.2)
M-Spike, %: 0.38 g/dL
Total Protein ELP: 6.6 g/dL (ref 6.0–8.3)

## 2010-04-24 LAB — CBC
HCT: 26.1 % — ABNORMAL LOW (ref 39.0–52.0)
HCT: 26.8 % — ABNORMAL LOW (ref 39.0–52.0)
HCT: 29 % — ABNORMAL LOW (ref 39.0–52.0)
HCT: 28.5 % — ABNORMAL LOW (ref 39.0–52.0)
Hemoglobin: 8.6 g/dL — ABNORMAL LOW (ref 13.0–17.0)
Hemoglobin: 8.7 g/dL — ABNORMAL LOW (ref 13.0–17.0)
Hemoglobin: 9.3 g/dL — ABNORMAL LOW (ref 13.0–17.0)
MCHC: 33.2 g/dL (ref 30.0–36.0)
MCHC: 32.7 g/dL (ref 30.0–36.0)
MCV: 84.7 fL (ref 78.0–100.0)
MCV: 84.8 fL (ref 78.0–100.0)
MCV: 84.9 fL (ref 78.0–100.0)
Platelets: 323 10*3/uL (ref 150–400)
RBC: 3.08 MIL/uL — ABNORMAL LOW (ref 4.22–5.81)
RBC: 3.14 MIL/uL — ABNORMAL LOW (ref 4.22–5.81)
RBC: 3.36 MIL/uL — ABNORMAL LOW (ref 4.22–5.81)
RDW: 17.6 % — ABNORMAL HIGH (ref 11.5–15.5)
RDW: 17.8 % — ABNORMAL HIGH (ref 11.5–15.5)
RDW: 18.1 % — ABNORMAL HIGH (ref 11.5–15.5)
WBC: 23.4 10*3/uL — ABNORMAL HIGH (ref 4.0–10.5)
WBC: 29.7 10*3/uL — ABNORMAL HIGH (ref 4.0–10.5)
WBC: 11.4 10*3/uL — ABNORMAL HIGH (ref 4.0–10.5)

## 2010-04-24 LAB — IMMUNOFIXATION ELECTROPHORESIS: Total Protein ELP: 6.6 g/dL (ref 6.0–8.3)

## 2010-04-24 LAB — BASIC METABOLIC PANEL
BUN: 154 mg/dL — ABNORMAL HIGH (ref 6–23)
CO2: 24 mEq/L (ref 19–32)
Calcium: 9.2 mg/dL (ref 8.4–10.5)
Chloride: 94 mEq/L — ABNORMAL LOW (ref 96–112)
Chloride: 99 mEq/L (ref 96–112)
Creatinine, Ser: 7.28 mg/dL — ABNORMAL HIGH (ref 0.4–1.5)
GFR calc Af Amer: 21 mL/min — ABNORMAL LOW (ref >60)
GFR calc Af Amer: 26 mL/min — ABNORMAL LOW (ref >60)
GFR calc non Af Amer: 21 mL/min — ABNORMAL LOW (ref >60)
Glucose, Bld: 158 mg/dL — ABNORMAL HIGH (ref 70–99)
Glucose, Bld: 76 mg/dL (ref 70–99)
Glucose, Bld: 93 mg/dL (ref 70–99)
Potassium: 3.6 mEq/L (ref 3.5–5.1)
Potassium: 3.9 mEq/L (ref 3.5–5.1)
Potassium: 4.4 mEq/L (ref 3.5–5.1)
Sodium: 136 mEq/L (ref 135–145)
Sodium: 137 mEq/L (ref 135–145)

## 2010-04-24 LAB — PROTIME-INR
INR: 2.15 — ABNORMAL HIGH (ref 0.00–1.49)
INR: 2.7 — ABNORMAL HIGH (ref 0.00–1.49)
INR: 4.96 — ABNORMAL HIGH (ref 0.00–1.49)
Prothrombin Time: 45.8 seconds — ABNORMAL HIGH (ref 11.6–15.2)

## 2010-04-24 LAB — IGG, IGA, IGM
IgG (Immunoglobin G), Serum: 1890 mg/dL — ABNORMAL HIGH (ref 694–1618)
IgM, Serum: 154 mg/dL (ref 60–263)

## 2010-04-24 LAB — GLUCOSE, CAPILLARY
Glucose-Capillary: 100 mg/dL — ABNORMAL HIGH (ref 70–99)
Glucose-Capillary: 112 mg/dL — ABNORMAL HIGH (ref 70–99)
Glucose-Capillary: 118 mg/dL — ABNORMAL HIGH (ref 70–99)
Glucose-Capillary: 146 mg/dL — ABNORMAL HIGH (ref 70–99)
Glucose-Capillary: 153 mg/dL — ABNORMAL HIGH (ref 70–99)

## 2010-04-24 LAB — MISCELLANEOUS TEST - ANATOMIC PATHOLOGY

## 2010-04-24 LAB — IMMUNOFIXATION ADD-ON

## 2010-04-24 LAB — URIC ACID: Uric Acid, Serum: 13.7 mg/dL — ABNORMAL HIGH (ref 4.0–7.8)

## 2010-04-25 ENCOUNTER — Ambulatory Visit (HOSPITAL_COMMUNITY): Payer: BC Managed Care – PPO | Admitting: Physical Therapy

## 2010-04-25 ENCOUNTER — Emergency Department (HOSPITAL_COMMUNITY)
Admission: EM | Admit: 2010-04-25 | Discharge: 2010-04-25 | Disposition: A | Discharge status: Home / Self Care | Payer: BC Managed Care – PPO | Attending: Emergency Medicine | Admitting: Emergency Medicine

## 2010-04-25 DIAGNOSIS — R109 Unspecified abdominal pain: Secondary | ICD-10-CM | POA: Insufficient documentation

## 2010-04-25 DIAGNOSIS — M549 Dorsalgia, unspecified: Secondary | ICD-10-CM | POA: Insufficient documentation

## 2010-04-25 DIAGNOSIS — N189 Chronic kidney disease, unspecified: Secondary | ICD-10-CM | POA: Insufficient documentation

## 2010-04-25 DIAGNOSIS — Z8739 Personal history of other diseases of the musculoskeletal system and connective tissue: Secondary | ICD-10-CM | POA: Insufficient documentation

## 2010-04-25 DIAGNOSIS — E119 Type 2 diabetes mellitus without complications: Secondary | ICD-10-CM | POA: Insufficient documentation

## 2010-04-25 DIAGNOSIS — I129 Hypertensive chronic kidney disease with stage 1 through stage 4 chronic kidney disease, or unspecified chronic kidney disease: Secondary | ICD-10-CM | POA: Insufficient documentation

## 2010-04-25 DIAGNOSIS — E669 Obesity, unspecified: Secondary | ICD-10-CM | POA: Insufficient documentation

## 2010-04-25 DIAGNOSIS — G8929 Other chronic pain: Secondary | ICD-10-CM | POA: Insufficient documentation

## 2010-04-25 DIAGNOSIS — K5900 Constipation, unspecified: Secondary | ICD-10-CM | POA: Insufficient documentation

## 2010-04-25 LAB — GLUCOSE, CAPILLARY
Glucose-Capillary: 101 mg/dL — ABNORMAL HIGH (ref 70–99)
Glucose-Capillary: 101 mg/dL — ABNORMAL HIGH (ref 70–99)
Glucose-Capillary: 102 mg/dL — ABNORMAL HIGH (ref 70–99)
Glucose-Capillary: 102 mg/dL — ABNORMAL HIGH (ref 70–99)
Glucose-Capillary: 103 mg/dL — ABNORMAL HIGH (ref 70–99)
Glucose-Capillary: 103 mg/dL — ABNORMAL HIGH (ref 70–99)
Glucose-Capillary: 103 mg/dL — ABNORMAL HIGH (ref 70–99)
Glucose-Capillary: 103 mg/dL — ABNORMAL HIGH (ref 70–99)
Glucose-Capillary: 104 mg/dL — ABNORMAL HIGH (ref 70–99)
Glucose-Capillary: 105 mg/dL — ABNORMAL HIGH (ref 70–99)
Glucose-Capillary: 106 mg/dL — ABNORMAL HIGH (ref 70–99)
Glucose-Capillary: 106 mg/dL — ABNORMAL HIGH (ref 70–99)
Glucose-Capillary: 110 mg/dL — ABNORMAL HIGH (ref 70–99)
Glucose-Capillary: 111 mg/dL — ABNORMAL HIGH (ref 70–99)
Glucose-Capillary: 113 mg/dL — ABNORMAL HIGH (ref 70–99)
Glucose-Capillary: 115 mg/dL — ABNORMAL HIGH (ref 70–99)
Glucose-Capillary: 123 mg/dL — ABNORMAL HIGH (ref 70–99)
Glucose-Capillary: 123 mg/dL — ABNORMAL HIGH (ref 70–99)
Glucose-Capillary: 124 mg/dL — ABNORMAL HIGH (ref 70–99)
Glucose-Capillary: 131 mg/dL — ABNORMAL HIGH (ref 70–99)
Glucose-Capillary: 135 mg/dL — ABNORMAL HIGH (ref 70–99)
Glucose-Capillary: 145 mg/dL — ABNORMAL HIGH (ref 70–99)
Glucose-Capillary: 146 mg/dL — ABNORMAL HIGH (ref 70–99)
Glucose-Capillary: 166 mg/dL — ABNORMAL HIGH (ref 70–99)
Glucose-Capillary: 169 mg/dL — ABNORMAL HIGH (ref 70–99)
Glucose-Capillary: 206 mg/dL — ABNORMAL HIGH (ref 70–99)
Glucose-Capillary: 206 mg/dL — ABNORMAL HIGH (ref 70–99)
Glucose-Capillary: 243 mg/dL — ABNORMAL HIGH (ref 70–99)
Glucose-Capillary: 252 mg/dL — ABNORMAL HIGH (ref 70–99)
Glucose-Capillary: 262 mg/dL — ABNORMAL HIGH (ref 70–99)
Glucose-Capillary: 79 mg/dL (ref 70–99)
Glucose-Capillary: 88 mg/dL (ref 70–99)
Glucose-Capillary: 91 mg/dL (ref 70–99)
Glucose-Capillary: 91 mg/dL (ref 70–99)
Glucose-Capillary: 92 mg/dL (ref 70–99)
Glucose-Capillary: 92 mg/dL (ref 70–99)
Glucose-Capillary: 93 mg/dL (ref 70–99)
Glucose-Capillary: 96 mg/dL (ref 70–99)
Glucose-Capillary: 97 mg/dL (ref 70–99)
Glucose-Capillary: 98 mg/dL (ref 70–99)
Glucose-Capillary: 99 mg/dL (ref 70–99)
Glucose-Capillary: 99 mg/dL (ref 70–99)

## 2010-04-25 LAB — DIFFERENTIAL
Basophils Absolute: 0 10*3/uL (ref 0.0–0.1)
Basophils Absolute: 0.1 10*3/uL (ref 0.0–0.1)
Basophils Relative: 1 % (ref 0–1)
Eosinophils Absolute: 0.1 10*3/uL (ref 0.0–0.7)
Lymphocytes Relative: 5 % — ABNORMAL LOW (ref 12–46)
Lymphs Abs: 1 10*3/uL (ref 0.7–4.0)
Monocytes Absolute: 0.6 10*3/uL (ref 0.1–1.0)
Neutro Abs: 14.6 10*3/uL — ABNORMAL HIGH (ref 1.7–7.7)
Neutrophils Relative %: 92 % — ABNORMAL HIGH (ref 43–77)

## 2010-04-25 LAB — COMPREHENSIVE METABOLIC PANEL
ALT: 18 U/L (ref 0–53)
AST: 26 U/L (ref 0–37)
Albumin: 2.1 g/dL — ABNORMAL LOW (ref 3.5–5.2)
CO2: 21 mEq/L (ref 19–32)
Chloride: 100 mEq/L (ref 96–112)
GFR calc Af Amer: 12 mL/min — ABNORMAL LOW (ref >60)
GFR calc non Af Amer: 10 mL/min — ABNORMAL LOW (ref >60)
Potassium: 3.7 mEq/L (ref 3.5–5.1)
Sodium: 138 mEq/L (ref 135–145)
Total Bilirubin: 0.9 mg/dL (ref 0.3–1.2)

## 2010-04-25 LAB — CULTURE, BLOOD (SINGLE)

## 2010-04-25 LAB — BASIC METABOLIC PANEL
BUN: 44 mg/dL — ABNORMAL HIGH (ref 6–23)
BUN: 52 mg/dL — ABNORMAL HIGH (ref 6–23)
CO2: 23 mEq/L (ref 19–32)
Calcium: 10 mg/dL (ref 8.4–10.5)
Calcium: 10 mg/dL (ref 8.4–10.5)
Calcium: 10.1 mg/dL (ref 8.4–10.5)
Calcium: 9.6 mg/dL (ref 8.4–10.5)
Calcium: 9.7 mg/dL (ref 8.4–10.5)
Chloride: 103 mEq/L (ref 96–112)
Chloride: 103 mEq/L (ref 96–112)
Chloride: 106 mEq/L (ref 96–112)
Chloride: 110 mEq/L (ref 96–112)
Creatinine, Ser: 2.22 mg/dL — ABNORMAL HIGH (ref 0.4–1.5)
Creatinine, Ser: 2.26 mg/dL — ABNORMAL HIGH (ref 0.4–1.5)
Creatinine, Ser: 2.48 mg/dL — ABNORMAL HIGH (ref 0.4–1.5)
GFR calc Af Amer: 30 mL/min — ABNORMAL LOW (ref >60)
GFR calc Af Amer: 35 mL/min — ABNORMAL LOW (ref >60)
GFR calc Af Amer: 39 mL/min — ABNORMAL LOW (ref >60)
GFR calc Af Amer: 39 mL/min — ABNORMAL LOW (ref >60)
GFR calc Af Amer: 40 mL/min — ABNORMAL LOW (ref >60)
GFR calc non Af Amer: 25 mL/min — ABNORMAL LOW (ref >60)
GFR calc non Af Amer: 29 mL/min — ABNORMAL LOW (ref >60)
GFR calc non Af Amer: 32 mL/min — ABNORMAL LOW (ref >60)
GFR calc non Af Amer: 33 mL/min — ABNORMAL LOW (ref >60)
Glucose, Bld: 100 mg/dL — ABNORMAL HIGH (ref 70–99)
Potassium: 3.9 mEq/L (ref 3.5–5.1)
Potassium: 4.1 mEq/L (ref 3.5–5.1)
Potassium: 4.9 mEq/L (ref 3.5–5.1)
Sodium: 134 mEq/L — ABNORMAL LOW (ref 135–145)
Sodium: 135 mEq/L (ref 135–145)
Sodium: 138 mEq/L (ref 135–145)

## 2010-04-25 LAB — CROSSMATCH: Antibody Screen: NEGATIVE

## 2010-04-25 LAB — CBC
HCT: 22.7 % — ABNORMAL LOW (ref 39.0–52.0)
HCT: 22.8 % — ABNORMAL LOW (ref 39.0–52.0)
HCT: 23 % — ABNORMAL LOW (ref 39.0–52.0)
HCT: 23.5 % — ABNORMAL LOW (ref 39.0–52.0)
HCT: 24.6 % — ABNORMAL LOW (ref 39.0–52.0)
HCT: 25.3 % — ABNORMAL LOW (ref 39.0–52.0)
HCT: 28.7 % — ABNORMAL LOW (ref 39.0–52.0)
HCT: 29.4 % — ABNORMAL LOW (ref 39.0–52.0)
HCT: 31.2 % — ABNORMAL LOW (ref 39.0–52.0)
Hemoglobin: 7.4 g/dL — ABNORMAL LOW (ref 13.0–17.0)
Hemoglobin: 7.4 g/dL — ABNORMAL LOW (ref 13.0–17.0)
Hemoglobin: 7.8 g/dL — ABNORMAL LOW (ref 13.0–17.0)
Hemoglobin: 8.1 g/dL — ABNORMAL LOW (ref 13.0–17.0)
Hemoglobin: 8.1 g/dL — ABNORMAL LOW (ref 13.0–17.0)
Hemoglobin: 8.3 g/dL — ABNORMAL LOW (ref 13.0–17.0)
Hemoglobin: 8.6 g/dL — ABNORMAL LOW (ref 13.0–17.0)
Hemoglobin: 8.9 g/dL — ABNORMAL LOW (ref 13.0–17.0)
Hemoglobin: 9.3 g/dL — ABNORMAL LOW (ref 13.0–17.0)
Hemoglobin: 9.7 g/dL — ABNORMAL LOW (ref 13.0–17.0)
MCHC: 32.3 g/dL (ref 30.0–36.0)
MCHC: 32.6 g/dL (ref 30.0–36.0)
MCHC: 32.6 g/dL (ref 30.0–36.0)
MCHC: 32.7 g/dL (ref 30.0–36.0)
MCHC: 32.8 g/dL (ref 30.0–36.0)
MCHC: 33 g/dL (ref 30.0–36.0)
MCV: 85.4 fL (ref 78.0–100.0)
MCV: 85.6 fL (ref 78.0–100.0)
MCV: 87 fL (ref 78.0–100.0)
MCV: 87.1 fL (ref 78.0–100.0)
Platelets: 239 10*3/uL (ref 150–400)
Platelets: 281 10*3/uL (ref 150–400)
Platelets: 295 10*3/uL (ref 150–400)
RBC: 2.63 MIL/uL — ABNORMAL LOW (ref 4.22–5.81)
RBC: 2.66 MIL/uL — ABNORMAL LOW (ref 4.22–5.81)
RBC: 2.75 MIL/uL — ABNORMAL LOW (ref 4.22–5.81)
RBC: 2.77 MIL/uL — ABNORMAL LOW (ref 4.22–5.81)
RBC: 2.79 MIL/uL — ABNORMAL LOW (ref 4.22–5.81)
RBC: 2.83 MIL/uL — ABNORMAL LOW (ref 4.22–5.81)
RBC: 2.91 MIL/uL — ABNORMAL LOW (ref 4.22–5.81)
RBC: 3 MIL/uL — ABNORMAL LOW (ref 4.22–5.81)
RBC: 3.15 MIL/uL — ABNORMAL LOW (ref 4.22–5.81)
RDW: 18 % — ABNORMAL HIGH (ref 11.5–15.5)
RDW: 18 % — ABNORMAL HIGH (ref 11.5–15.5)
RDW: 18.3 % — ABNORMAL HIGH (ref 11.5–15.5)
RDW: 18.5 % — ABNORMAL HIGH (ref 11.5–15.5)
RDW: 18.5 % — ABNORMAL HIGH (ref 11.5–15.5)
RDW: 19 % — ABNORMAL HIGH (ref 11.5–15.5)
RDW: 19.5 % — ABNORMAL HIGH (ref 11.5–15.5)
RDW: 20.1 % — ABNORMAL HIGH (ref 11.5–15.5)
RDW: 20.4 % — ABNORMAL HIGH (ref 11.5–15.5)
WBC: 12.7 10*3/uL — ABNORMAL HIGH (ref 4.0–10.5)
WBC: 14.7 10*3/uL — ABNORMAL HIGH (ref 4.0–10.5)
WBC: 15.2 10*3/uL — ABNORMAL HIGH (ref 4.0–10.5)
WBC: 16.5 10*3/uL — ABNORMAL HIGH (ref 4.0–10.5)
WBC: 16.7 10*3/uL — ABNORMAL HIGH (ref 4.0–10.5)
WBC: 18.6 10*3/uL — ABNORMAL HIGH (ref 4.0–10.5)
WBC: 19.6 10*3/uL — ABNORMAL HIGH (ref 4.0–10.5)

## 2010-04-25 LAB — UIFE/LIGHT CHAINS/TP QN, 24-HR UR
Albumin, U: DETECTED
Alpha 1, Urine: DETECTED — AB
Beta, Urine: DETECTED — AB
Free Kappa Lt Chains,Ur: 12.3 mg/dL — ABNORMAL HIGH (ref 0.04–1.51)
Free Lambda Excretion/Day: 167.9 mg/d
Gamma Globulin, Urine: DETECTED — AB
Time: 24 hours
Total Protein, Urine-Ur/day: 521 mg/d — ABNORMAL HIGH (ref 10–140)

## 2010-04-25 LAB — RENAL FUNCTION PANEL
Albumin: 2 g/dL — ABNORMAL LOW (ref 3.5–5.2)
Albumin: 2.2 g/dL — ABNORMAL LOW (ref 3.5–5.2)
BUN: 119 mg/dL — ABNORMAL HIGH (ref 6–23)
BUN: 137 mg/dL — ABNORMAL HIGH (ref 6–23)
CO2: 21 mEq/L (ref 19–32)
CO2: 21 mEq/L (ref 19–32)
CO2: 22 mEq/L (ref 19–32)
CO2: 23 mEq/L (ref 19–32)
Calcium: 10.1 mg/dL (ref 8.4–10.5)
Calcium: 8.8 mg/dL (ref 8.4–10.5)
Chloride: 103 mEq/L (ref 96–112)
Chloride: 107 mEq/L (ref 96–112)
Chloride: 107 mEq/L (ref 96–112)
Creatinine, Ser: 7.3 mg/dL — ABNORMAL HIGH (ref 0.4–1.5)
GFR calc Af Amer: 10 mL/min — ABNORMAL LOW (ref >60)
GFR calc Af Amer: 26 mL/min — ABNORMAL LOW (ref >60)
GFR calc Af Amer: 29 mL/min — ABNORMAL LOW (ref >60)
GFR calc non Af Amer: 16 mL/min — ABNORMAL LOW (ref >60)
GFR calc non Af Amer: 22 mL/min — ABNORMAL LOW (ref >60)
GFR calc non Af Amer: 24 mL/min — ABNORMAL LOW (ref >60)
Glucose, Bld: 117 mg/dL — ABNORMAL HIGH (ref 70–99)
Glucose, Bld: 178 mg/dL — ABNORMAL HIGH (ref 70–99)
Phosphorus: 10.1 mg/dL (ref 2.3–4.6)
Phosphorus: 2.9 mg/dL (ref 2.3–4.6)
Phosphorus: 6.5 mg/dL — ABNORMAL HIGH (ref 2.3–4.6)
Potassium: 3.7 mEq/L (ref 3.5–5.1)
Potassium: 3.8 mEq/L (ref 3.5–5.1)
Potassium: 3.9 mEq/L (ref 3.5–5.1)
Potassium: 4.5 mEq/L (ref 3.5–5.1)
Sodium: 137 mEq/L (ref 135–145)
Sodium: 137 mEq/L (ref 135–145)
Sodium: 138 mEq/L (ref 135–145)

## 2010-04-25 LAB — URINALYSIS, ROUTINE W REFLEX MICROSCOPIC
Hgb urine dipstick: NEGATIVE
Ketones, ur: NEGATIVE mg/dL
Nitrite: NEGATIVE
Protein, ur: 100 mg/dL — AB
Urobilinogen, UA: 0.2 mg/dL (ref 0.0–1.0)

## 2010-04-25 LAB — URIC ACID
Uric Acid, Serum: 13.7 mg/dL (ref 4.0–7.8)
Uric Acid, Serum: 15.1 mg/dL (ref 4.0–7.8)

## 2010-04-25 LAB — CULTURE, BLOOD (ROUTINE X 2)

## 2010-04-25 LAB — URINE CULTURE: Colony Count: 4000

## 2010-04-25 LAB — PROTIME-INR
INR: 1.31 (ref 0.00–1.49)
INR: 1.45 (ref 0.00–1.49)
INR: 1.51 — ABNORMAL HIGH (ref 0.00–1.49)
INR: 1.67 — ABNORMAL HIGH (ref 0.00–1.49)
INR: 2.3 — ABNORMAL HIGH (ref 0.00–1.49)
INR: 2.63 — ABNORMAL HIGH (ref 0.00–1.49)
INR: 4.83 — ABNORMAL HIGH (ref 0.00–1.49)
Prothrombin Time: 16 seconds — ABNORMAL HIGH (ref 11.6–15.2)
Prothrombin Time: 16.2 seconds — ABNORMAL HIGH (ref 11.6–15.2)
Prothrombin Time: 18.6 seconds — ABNORMAL HIGH (ref 11.6–15.2)
Prothrombin Time: 25.1 seconds — ABNORMAL HIGH (ref 11.6–15.2)
Prothrombin Time: 44.8 seconds — ABNORMAL HIGH (ref 11.6–15.2)

## 2010-04-25 LAB — KAPPA/LAMBDA LIGHT CHAINS
Kappa, lambda light chain ratio: 0.45 (ref 0.26–1.65)
Lambda free light chains: 17.9 mg/dL — ABNORMAL HIGH (ref 0.57–2.63)

## 2010-04-25 LAB — FERRITIN
Ferritin: 889 ng/mL — ABNORMAL HIGH (ref 22–322)
Ferritin: 901 ng/mL — ABNORMAL HIGH (ref 22–322)

## 2010-04-25 LAB — IRON AND TIBC
Saturation Ratios: 40 % (ref 20–55)
UIBC: 119 ug/dL

## 2010-04-25 LAB — MAGNESIUM: Magnesium: 1.7 mg/dL (ref 1.5–2.5)

## 2010-04-25 LAB — RETICULOCYTES
RBC.: 3.02 MIL/uL — ABNORMAL LOW (ref 4.22–5.81)
Retic Ct Pct: 4.1 % — ABNORMAL HIGH (ref 0.4–3.1)

## 2010-04-25 LAB — IRON: Iron: 24 ug/dL — ABNORMAL LOW (ref 42–135)

## 2010-04-25 LAB — HEPARIN LEVEL (UNFRACTIONATED)
Heparin Unfractionated: 0.36 IU/mL (ref 0.30–0.70)
Heparin Unfractionated: 0.5 IU/mL (ref 0.30–0.70)

## 2010-04-25 LAB — WOUND CULTURE

## 2010-04-25 LAB — FOLATE: Folate: 4.8 ng/mL

## 2010-04-26 LAB — URINALYSIS, ROUTINE W REFLEX MICROSCOPIC
Bilirubin Urine: NEGATIVE
Ketones, ur: NEGATIVE mg/dL
Nitrite: NEGATIVE
Protein, ur: NEGATIVE mg/dL
Urobilinogen, UA: 0.2 mg/dL (ref 0.0–1.0)
pH: 5 (ref 5.0–8.0)

## 2010-04-26 LAB — DIFFERENTIAL
Eosinophils Absolute: 0.6 10*3/uL (ref 0.0–0.7)
Eosinophils Relative: 6 % — ABNORMAL HIGH (ref 0–5)
Monocytes Absolute: 1 10*3/uL (ref 0.1–1.0)
Neutrophils Relative %: 71 % (ref 43–77)

## 2010-04-26 LAB — COMPREHENSIVE METABOLIC PANEL
ALT: 9 U/L (ref 0–53)
Alkaline Phosphatase: 79 U/L (ref 39–117)
BUN: 23 mg/dL (ref 6–23)
CO2: 27 mEq/L (ref 19–32)
Calcium: 10.7 mg/dL — ABNORMAL HIGH (ref 8.4–10.5)
GFR calc non Af Amer: 19 mL/min — ABNORMAL LOW (ref >60)
Glucose, Bld: 104 mg/dL — ABNORMAL HIGH (ref 70–99)
Sodium: 141 mEq/L (ref 135–145)

## 2010-04-26 LAB — URINE CULTURE
Colony Count: NO GROWTH
Culture  Setup Time: 201203221200
Culture: NO GROWTH

## 2010-04-26 LAB — CBC
HCT: 26.1 % — ABNORMAL LOW (ref 39.0–52.0)
Hemoglobin: 8.6 g/dL — ABNORMAL LOW (ref 13.0–17.0)
MCHC: 33.1 g/dL (ref 30.0–36.0)
RBC: 3.09 MIL/uL — ABNORMAL LOW (ref 4.22–5.81)

## 2010-04-26 LAB — URINE MICROSCOPIC-ADD ON

## 2010-04-26 LAB — PROTIME-INR: Prothrombin Time: 48.3 seconds — ABNORMAL HIGH (ref 11.6–15.2)

## 2010-04-26 LAB — LIPASE, BLOOD: Lipase: 40 U/L (ref 11–59)

## 2010-04-29 LAB — POCT HEMOGLOBIN-HEMACUE
Hemoglobin: 8.5 g/dL — ABNORMAL LOW (ref 13.0–17.0)
Hemoglobin: 8.6 g/dL — ABNORMAL LOW (ref 13.0–17.0)

## 2010-05-06 LAB — DIFFERENTIAL
Basophils Relative: 0 % (ref 0–1)
Monocytes Relative: 4 % (ref 3–12)
Neutro Abs: 19.3 10*3/uL — ABNORMAL HIGH (ref 1.7–7.7)
Neutrophils Relative %: 91 % — ABNORMAL HIGH (ref 43–77)

## 2010-05-06 LAB — BASIC METABOLIC PANEL
CO2: 26 mEq/L (ref 19–32)
Calcium: 9.3 mg/dL (ref 8.4–10.5)
Creatinine, Ser: 1.85 mg/dL — ABNORMAL HIGH (ref 0.4–1.5)
GFR calc Af Amer: 49 mL/min — ABNORMAL LOW (ref >60)

## 2010-05-06 LAB — CBC
MCHC: 32.2 g/dL (ref 30.0–36.0)
RBC: 4.62 MIL/uL (ref 4.22–5.81)
WBC: 21.2 10*3/uL — ABNORMAL HIGH (ref 4.0–10.5)

## 2010-05-06 LAB — WOUND CULTURE

## 2010-05-06 LAB — GRAM STAIN

## 2010-05-07 ENCOUNTER — Inpatient Hospital Stay (HOSPITAL_COMMUNITY)
Admission: EM | Admit: 2010-05-07 | Discharge: 2010-05-10 | DRG: 138 | Disposition: A | Discharge status: Home / Self Care | Payer: BC Managed Care – PPO | Attending: Internal Medicine | Admitting: Internal Medicine

## 2010-05-07 ENCOUNTER — Emergency Department (HOSPITAL_COMMUNITY): Payer: BC Managed Care – PPO

## 2010-05-07 ENCOUNTER — Encounter (HOSPITAL_COMMUNITY): Payer: Self-pay | Admitting: Radiology

## 2010-05-07 ENCOUNTER — Ambulatory Visit (HOSPITAL_COMMUNITY): Payer: BC Managed Care – PPO | Admitting: Physical Therapy

## 2010-05-07 DIAGNOSIS — I129 Hypertensive chronic kidney disease with stage 1 through stage 4 chronic kidney disease, or unspecified chronic kidney disease: Secondary | ICD-10-CM | POA: Diagnosis present

## 2010-05-07 DIAGNOSIS — Z86718 Personal history of other venous thrombosis and embolism: Secondary | ICD-10-CM

## 2010-05-07 DIAGNOSIS — R7309 Other abnormal glucose: Secondary | ICD-10-CM | POA: Diagnosis present

## 2010-05-07 DIAGNOSIS — M109 Gout, unspecified: Secondary | ICD-10-CM | POA: Diagnosis present

## 2010-05-07 DIAGNOSIS — K2960 Other gastritis without bleeding: Secondary | ICD-10-CM | POA: Diagnosis present

## 2010-05-07 DIAGNOSIS — D638 Anemia in other chronic diseases classified elsewhere: Secondary | ICD-10-CM | POA: Diagnosis present

## 2010-05-07 DIAGNOSIS — I498 Other specified cardiac arrhythmias: Principal | ICD-10-CM | POA: Diagnosis present

## 2010-05-07 DIAGNOSIS — K2210 Ulcer of esophagus without bleeding: Secondary | ICD-10-CM | POA: Diagnosis present

## 2010-05-07 DIAGNOSIS — N183 Chronic kidney disease, stage 3 unspecified: Secondary | ICD-10-CM | POA: Diagnosis present

## 2010-05-07 DIAGNOSIS — K2100 Gastro-esophageal reflux disease with esophagitis, without bleeding: Secondary | ICD-10-CM | POA: Diagnosis present

## 2010-05-07 DIAGNOSIS — K5900 Constipation, unspecified: Secondary | ICD-10-CM | POA: Diagnosis present

## 2010-05-07 DIAGNOSIS — D72829 Elevated white blood cell count, unspecified: Secondary | ICD-10-CM | POA: Diagnosis present

## 2010-05-07 DIAGNOSIS — R5381 Other malaise: Secondary | ICD-10-CM | POA: Diagnosis present

## 2010-05-07 DIAGNOSIS — T380X5A Adverse effect of glucocorticoids and synthetic analogues, initial encounter: Secondary | ICD-10-CM | POA: Diagnosis present

## 2010-05-07 LAB — COMPREHENSIVE METABOLIC PANEL
ALT: 15 U/L (ref 0–53)
AST: 34 U/L (ref 0–37)
Albumin: 2.6 g/dL — ABNORMAL LOW (ref 3.5–5.2)
Calcium: 9.1 mg/dL (ref 8.4–10.5)
Chloride: 102 mEq/L (ref 96–112)
Creatinine, Ser: 3.25 mg/dL — ABNORMAL HIGH (ref 0.4–1.5)
GFR calc Af Amer: 25 mL/min — ABNORMAL LOW (ref >60)
Sodium: 138 mEq/L (ref 135–145)

## 2010-05-07 LAB — DIFFERENTIAL
Basophils Absolute: 0 10*3/uL (ref 0.0–0.1)
Basophils Relative: 0 % (ref 0–1)
Eosinophils Absolute: 0.1 10*3/uL (ref 0.0–0.7)
Monocytes Relative: 6 % (ref 3–12)
Neutrophils Relative %: 82 % — ABNORMAL HIGH (ref 43–77)

## 2010-05-07 LAB — URINALYSIS, ROUTINE W REFLEX MICROSCOPIC
Bilirubin Urine: NEGATIVE
Leukocytes, UA: NEGATIVE
Nitrite: NEGATIVE
Specific Gravity, Urine: 1.02 (ref 1.005–1.030)
Urobilinogen, UA: 1 mg/dL (ref 0.0–1.0)
pH: 6 (ref 5.0–8.0)

## 2010-05-07 LAB — CBC
MCH: 25.9 pg — ABNORMAL LOW (ref 26.0–34.0)
MCHC: 31.1 g/dL (ref 30.0–36.0)
Platelets: 345 10*3/uL (ref 150–400)
RBC: 4.05 MIL/uL — ABNORMAL LOW (ref 4.22–5.81)

## 2010-05-08 ENCOUNTER — Inpatient Hospital Stay (HOSPITAL_COMMUNITY): Payer: BC Managed Care – PPO

## 2010-05-08 DIAGNOSIS — R933 Abnormal findings on diagnostic imaging of other parts of digestive tract: Secondary | ICD-10-CM

## 2010-05-08 DIAGNOSIS — R1013 Epigastric pain: Secondary | ICD-10-CM

## 2010-05-08 DIAGNOSIS — D509 Iron deficiency anemia, unspecified: Secondary | ICD-10-CM

## 2010-05-08 DIAGNOSIS — R188 Other ascites: Secondary | ICD-10-CM

## 2010-05-08 DIAGNOSIS — I517 Cardiomegaly: Secondary | ICD-10-CM

## 2010-05-08 LAB — RAPID URINE DRUG SCREEN, HOSP PERFORMED
Benzodiazepines: NOT DETECTED
Cocaine: NOT DETECTED
Tetrahydrocannabinol: NOT DETECTED

## 2010-05-08 LAB — DIFFERENTIAL
Basophils Absolute: 0 10*3/uL (ref 0.0–0.1)
Basophils Relative: 0 % (ref 0–1)
Eosinophils Absolute: 0.1 10*3/uL (ref 0.0–0.7)
Neutrophils Relative %: 83 % — ABNORMAL HIGH (ref 43–77)

## 2010-05-08 LAB — CBC
MCH: 26.4 pg (ref 26.0–34.0)
RBC: 3.45 MIL/uL — ABNORMAL LOW (ref 4.22–5.81)
WBC: 17.7 10*3/uL — ABNORMAL HIGH (ref 4.0–10.5)

## 2010-05-08 LAB — RENAL FUNCTION PANEL
Albumin: 2.2 g/dL — ABNORMAL LOW (ref 3.5–5.2)
BUN: 39 mg/dL — ABNORMAL HIGH (ref 6–23)
CO2: 22 mEq/L (ref 19–32)
Chloride: 104 mEq/L (ref 96–112)
Creatinine, Ser: 3 mg/dL — ABNORMAL HIGH (ref 0.4–1.5)
GFR calc Af Amer: 28 mL/min — ABNORMAL LOW (ref >60)
GFR calc non Af Amer: 23 mL/min — ABNORMAL LOW (ref >60)
Potassium: 4.5 mEq/L (ref 3.5–5.1)

## 2010-05-08 LAB — TSH: TSH: 1.912 u[IU]/mL (ref 0.350–4.500)

## 2010-05-08 LAB — HEPATIC FUNCTION PANEL
ALT: 13 U/L (ref 0–53)
AST: 30 U/L (ref 0–37)
Albumin: 2.2 g/dL — ABNORMAL LOW (ref 3.5–5.2)
Bilirubin, Direct: 0.3 mg/dL (ref 0.0–0.3)

## 2010-05-08 LAB — HEPARIN LEVEL (UNFRACTIONATED): Heparin Unfractionated: <0.1 IU/mL — ABNORMAL LOW (ref 0.30–0.70)

## 2010-05-08 MED ORDER — XENON XE 133 GAS
10.0000 | GAS_FOR_INHALATION | Freq: Once | RESPIRATORY_TRACT | Status: AC | PRN
  Administered 2010-05-08: 7.9 via RESPIRATORY_TRACT

## 2010-05-08 MED ORDER — TECHNETIUM TO 99M ALBUMIN AGGREGATED
5.0000 | Freq: Once | INTRAVENOUS | Status: AC | PRN
  Administered 2010-05-08: 5.5 via INTRAVENOUS

## 2010-05-09 ENCOUNTER — Ambulatory Visit (HOSPITAL_COMMUNITY): Payer: BC Managed Care – PPO | Admitting: Physical Therapy

## 2010-05-09 DIAGNOSIS — K449 Diaphragmatic hernia without obstruction or gangrene: Secondary | ICD-10-CM

## 2010-05-09 DIAGNOSIS — K208 Other esophagitis without bleeding: Secondary | ICD-10-CM

## 2010-05-09 DIAGNOSIS — I498 Other specified cardiac arrhythmias: Secondary | ICD-10-CM

## 2010-05-09 LAB — HEPATIC FUNCTION PANEL
AST: 29 U/L (ref 0–37)
Albumin: 2.1 g/dL — ABNORMAL LOW (ref 3.5–5.2)
Total Bilirubin: 0.5 mg/dL (ref 0.3–1.2)
Total Protein: 4.9 g/dL — ABNORMAL LOW (ref 6.0–8.3)

## 2010-05-09 LAB — RENAL FUNCTION PANEL
Albumin: 2.1 g/dL — ABNORMAL LOW (ref 3.5–5.2)
CO2: 23 mEq/L (ref 19–32)
Chloride: 105 mEq/L (ref 96–112)
Creatinine, Ser: 2.4 mg/dL — ABNORMAL HIGH (ref 0.4–1.5)
GFR calc Af Amer: 36 mL/min — ABNORMAL LOW (ref >60)
GFR calc non Af Amer: 30 mL/min — ABNORMAL LOW (ref >60)
Potassium: 5.1 mEq/L (ref 3.5–5.1)
Sodium: 139 mEq/L (ref 135–145)

## 2010-05-09 LAB — CBC
Hemoglobin: 8.6 g/dL — ABNORMAL LOW (ref 13.0–17.0)
Platelets: 344 10*3/uL (ref 150–400)
RBC: 3.24 MIL/uL — ABNORMAL LOW (ref 4.22–5.81)
WBC: 22.6 10*3/uL — ABNORMAL HIGH (ref 4.0–10.5)

## 2010-05-09 LAB — DIFFERENTIAL
Basophils Absolute: 0 10*3/uL (ref 0.0–0.1)
Basophils Relative: 0 % (ref 0–1)
Eosinophils Absolute: 0 10*3/uL (ref 0.0–0.7)
Monocytes Relative: 5 % (ref 3–12)
Neutro Abs: 20.4 10*3/uL — ABNORMAL HIGH (ref 1.7–7.7)
Neutrophils Relative %: 90 % — ABNORMAL HIGH (ref 43–77)

## 2010-05-09 LAB — HEPARIN LEVEL (UNFRACTIONATED): Heparin Unfractionated: <0.1 IU/mL — ABNORMAL LOW (ref 0.30–0.70)

## 2010-05-09 LAB — GLUCOSE, CAPILLARY: Glucose-Capillary: 89 mg/dL (ref 70–99)

## 2010-05-09 LAB — T4, FREE: Free T4: 1.31 ng/dL (ref 0.80–1.80)

## 2010-05-09 LAB — AMYLASE: Amylase: 56 U/L (ref 0–105)

## 2010-05-09 LAB — LIPASE, BLOOD: Lipase: 61 U/L — ABNORMAL HIGH (ref 11–59)

## 2010-05-10 LAB — BASIC METABOLIC PANEL
BUN: 53 mg/dL — ABNORMAL HIGH (ref 6–23)
CO2: 23 mEq/L (ref 19–32)
Calcium: 9.7 mg/dL (ref 8.4–10.5)
Chloride: 113 mEq/L — ABNORMAL HIGH (ref 96–112)
Creatinine, Ser: 1.91 mg/dL — ABNORMAL HIGH (ref 0.4–1.5)
GFR calc Af Amer: 51 mL/min — ABNORMAL LOW (ref >60)
GFR calc Af Amer: 47 mL/min — ABNORMAL LOW (ref >60)
GFR calc non Af Amer: 42 mL/min — ABNORMAL LOW (ref >60)
Glucose, Bld: 215 mg/dL — ABNORMAL HIGH (ref 70–99)
Potassium: 4.2 mEq/L (ref 3.5–5.1)
Sodium: 138 mEq/L (ref 135–145)
Sodium: 141 mEq/L (ref 135–145)

## 2010-05-10 LAB — DIFFERENTIAL
Basophils Absolute: 0 10*3/uL (ref 0.0–0.1)
Basophils Absolute: 0 10*3/uL (ref 0.0–0.1)
Basophils Absolute: 0.1 10*3/uL (ref 0.0–0.1)
Basophils Relative: 1 % (ref 0–1)
Lymphocytes Relative: 10 % — ABNORMAL LOW (ref 12–46)
Lymphocytes Relative: 8 % — ABNORMAL LOW (ref 12–46)
Lymphs Abs: 2.2 10*3/uL (ref 0.7–4.0)
Lymphs Abs: 1 10*3/uL (ref 0.7–4.0)
Monocytes Absolute: 1.5 10*3/uL — ABNORMAL HIGH (ref 0.1–1.0)
Monocytes Absolute: 0.3 10*3/uL (ref 0.1–1.0)
Monocytes Relative: 2 % — ABNORMAL LOW (ref 3–12)
Neutro Abs: 18.5 10*3/uL — ABNORMAL HIGH (ref 1.7–7.7)
Neutro Abs: 10.9 10*3/uL — ABNORMAL HIGH (ref 1.7–7.7)
Neutro Abs: 6.3 10*3/uL (ref 1.7–7.7)
Neutrophils Relative %: 69 % (ref 43–77)

## 2010-05-10 LAB — URIC ACID: Uric Acid, Serum: 10.4 mg/dL — ABNORMAL HIGH (ref 4.0–7.8)

## 2010-05-10 LAB — URIC ACID, URINE, TIMED
Collection Interval-UUA: 24 hours
Uric Acid, 24H Ur: 327 mg/d (ref 250–750)
Uric Acid, Urine: 21.8 mg/dL

## 2010-05-10 LAB — CBC
HCT: 25.9 % — ABNORMAL LOW (ref 39.0–52.0)
Hemoglobin: 8.1 g/dL — ABNORMAL LOW (ref 13.0–17.0)
Hemoglobin: 11.9 g/dL — ABNORMAL LOW (ref 13.0–17.0)
MCHC: 31.3 g/dL (ref 30.0–36.0)
MCV: 84.4 fL (ref 78.0–100.0)
MCV: 90.7 fL (ref 78.0–100.0)
RBC: 4.02 MIL/uL — ABNORMAL LOW (ref 4.22–5.81)
RBC: 3.72 MIL/uL — ABNORMAL LOW (ref 4.22–5.81)
RDW: 17.2 % — ABNORMAL HIGH (ref 11.5–15.5)
WBC: 22.2 10*3/uL — ABNORMAL HIGH (ref 4.0–10.5)
WBC: 12.3 10*3/uL — ABNORMAL HIGH (ref 4.0–10.5)
WBC: 9.1 10*3/uL (ref 4.0–10.5)

## 2010-05-10 LAB — H. PYLORI ANTIBODY, IGG: H Pylori IgG: <0.4 {ISR}

## 2010-05-10 LAB — IRON AND TIBC: Iron: 41 ug/dL — ABNORMAL LOW (ref 42–135)

## 2010-05-10 NOTE — H&P (Signed)
NAME:  Jack Irwin, Jack Irwin            ACCOUNT NO.:  [id number]  MEDICAL RECORD NO.:  [id number]           PATIENT TYPE:  E  LOCATION:  [facility]                          FACILITY:  [facility]  PHYSICIAN:  Orvan Falconer, MD           DATE OF BIRTH:  [birth year]  DATE OF ADMISSION:  05/07/2010 DATE OF DISCHARGE:  LH                             HISTORY & PHYSICAL   PRIMARY CARE PHYSICIAN:  Nolene Ebbs, MD  ADVANCE DIRECTIVE:  Full code.  REASON FOR ADMISSION:  Tachycardia, rule out PE.  HISTORY OF PRESENT ILLNESS:  This is a 45 year old male with morbid obesity and history of chronic tachycardia, prior history of DVT, chronic kidney disease stage III not yet on dialysis, gout, chronic intermittent abdominal pain with constipation, presents to the emergency room again with abdominal and back pain.  He has not had a bowel movement in a few days.  He denied any black stool or bloody stool. Evaluation in the emergency room originally included an abdominal pelvic CT which was negative except for an incidental small "adrenal adenoma" of unclear significance.  He was given Dilaudid and was going to be discharged home as he has history of chronic tachycardia with heart rate running 130-150 intermittently for many years, but prior to being discharged he developed tachycardia with sinus tach at 150 having chest pain and a little bit of shortness of breath.  The EKG did not show any acute ST-T changes.  Because of the tachycardia and his increased risk for a PE, hospitalist was asked to admit the patient for obtaining a ventilation-perfusion scan tomorrow.  He also had told me that he had taken steroid before, but he really cannot quantify how much and for how long.  Further workup in the emergency room showed chest x-ray with cardiomegaly, but otherwise unremarkable, a leukocytosis with a white count of 16.6K, hemoglobin of 10.5, creatinine of 3.25 and potassium of 4.6.  His calcium is 9.1.  As noted before,  he has tachycardia and was determined that it was chronic tachycardia, but I did not see a workup for his tachycardia before.  PAST MEDICAL HISTORY: 1. CKD stage III. 2. Chronic tachycardia. 3. Chronic leukocytosis. 4. Morbid obesity. 5. Severe deconditioning. 6. History of DVT, previously on Coumadin but not present time. 7. Gout.  SOCIAL HISTORY:  He lives with his wife.  He denied tobacco, alcohol or drug use.  CURRENT MEDICATIONS: 1. Allopurinol 100-300 mg daily. 2. Metoprolol 50 mg b.i.d. 3. Oxycodone 1-2 per day. 4. PhosLo 667 mg t.i.d. 5. Prednisone intermittently. 6. Lasix 40 mg a day.  REVIEW OF SYSTEMS:  Otherwise, unremarkable.  PHYSICAL EXAMINATION:  VITAL SIGNS:  Blood pressure is 110/60, heart rate is 130, respiratory rate 18, afebrile. GENERAL:  He is alert and oriented and conversing.  He is not in any distress.  He does not look tachypneic. HEENT:  Sclerae nonicteric.  Throat is clear. NECK:  Supple. CARDIAC:  S1-S2 tachycardia with distinct heart sounds. LUNGS:  Without any wheezes, rales or any evidence of consolidation. ABDOMEN:  Obese, nondistended, nontender.  Bowel sounds present.  No palpable  mass or rebound. EXTREMITIES:  No edema. SKIN:  Warm and dry. NEURO:  No asterixis.  Strength equal bilaterally although his lower extremity is weaker. PSYCHIATRIC:  Unremarkable as well.  OBJECTIVE FINDINGS:  EKG shows sinus tachycardia without any acute ST-T changes.  CT of his abdomen and pelvis showed no acute abnormality. There is an incidental adrenal adenoma on the left.  White count is 16.6000, hemoglobin of 10.5, platelet count of 345,000, potassium of 4.6.  Liver function tests are normal.  Urinalysis is negative.  IMPRESSION:  This is a 45 year old male who has "chronic tachycardia," prior deep vein thrombosis, chronic kidney disease stage III, gout and intermittent abdominal pain, admitted because he has tachycardia, chest pain and shortness  of breath and has increased risk for pulmonary embolism.  Because of his elevated creatinine, we will give him heparin and get a V/Q scan in the morning.  For his tachycardia, I think he will benefit getting a full workup.  I am unclear if he is adrenal insufficient, because I could not get a story on his prednisone prior use.  We will give him Solu-Cortef 100 mg IV q.8 hours.  We will work him up for pheochromocytoma.  Note that he has a small "adenoma" in the adrenal gland on the left which likely is not significant.  However, due to the fact that he has signs and symptoms of pheochromocytoma, this could represent a pheo.  We will get 24-hour urine collection for VMA and metanephrine, we will check thyroid studies.  He could also have tachycardia from beta blocker withdrawal, and I will resume his Lopressor and give IV p.r.n.  I would like to get a urine drug screen as well.  We will get an echo of his heart.  He may benefit getting input from Cardiology as well.  He is otherwise stable, will be admitted to [facility].  He is a full code.  I will continue his allopurinol and we will give him some pain medication.  Orvan Falconer, MD     PL/MEDQ  D:  05/07/2010  T:  05/07/2010  Job:  [id number]  Electronically Signed by Orvan Falconer  on 05/10/2010 11:22:29 PM

## 2010-05-11 LAB — DIFFERENTIAL
Basophils Absolute: 0.1 10*3/uL (ref 0.0–0.1)
Basophils Relative: 1 % (ref 0–1)
Eosinophils Absolute: 0 10*3/uL (ref 0.0–0.7)
Eosinophils Absolute: 0 10*3/uL (ref 0.0–0.7)
Eosinophils Relative: 0 % (ref 0–5)
Lymphs Abs: 1.4 10*3/uL (ref 0.7–4.0)
Monocytes Absolute: 0.4 10*3/uL (ref 0.1–1.0)
Monocytes Relative: 5 % (ref 3–12)
Neutro Abs: 12.6 10*3/uL — ABNORMAL HIGH (ref 1.7–7.7)

## 2010-05-11 LAB — BASIC METABOLIC PANEL
CO2: 22 mEq/L (ref 19–32)
Calcium: 8.7 mg/dL (ref 8.4–10.5)
Creatinine, Ser: 1.85 mg/dL — ABNORMAL HIGH (ref 0.4–1.5)
Glucose, Bld: 126 mg/dL — ABNORMAL HIGH (ref 70–99)
Sodium: 142 mEq/L (ref 135–145)

## 2010-05-11 LAB — CBC
HCT: 34.9 % — ABNORMAL LOW (ref 39.0–52.0)
Hemoglobin: 11.5 g/dL — ABNORMAL LOW (ref 13.0–17.0)
MCHC: 33.6 g/dL (ref 30.0–36.0)
MCV: 91.4 fL (ref 78.0–100.0)
MCV: 90.3 fL (ref 78.0–100.0)
RBC: 3.82 MIL/uL — ABNORMAL LOW (ref 4.22–5.81)
RDW: 21.1 % — ABNORMAL HIGH (ref 11.5–15.5)
WBC: 16.5 10*3/uL — ABNORMAL HIGH (ref 4.0–10.5)

## 2010-05-12 LAB — CULTURE, BLOOD (ROUTINE X 2)
Culture: NO GROWTH
Culture: NO GROWTH

## 2010-05-13 LAB — DIFFERENTIAL
Basophils Absolute: 0.1 10*3/uL (ref 0.0–0.1)
Basophils Relative: 1 % (ref 0–1)
Lymphocytes Relative: 12 % (ref 12–46)
Monocytes Relative: 6 % (ref 3–12)
Neutro Abs: 12.1 10*3/uL — ABNORMAL HIGH (ref 1.7–7.7)
Neutrophils Relative %: 81 % — ABNORMAL HIGH (ref 43–77)

## 2010-05-13 LAB — COMPREHENSIVE METABOLIC PANEL
AST: 21 U/L (ref 0–37)
Albumin: 3.4 g/dL — ABNORMAL LOW (ref 3.5–5.2)
BUN: 28 mg/dL — ABNORMAL HIGH (ref 6–23)
Calcium: 9 mg/dL (ref 8.4–10.5)
Creatinine, Ser: 1.61 mg/dL — ABNORMAL HIGH (ref 0.4–1.5)
GFR calc Af Amer: 58 mL/min — ABNORMAL LOW (ref >60)

## 2010-05-13 LAB — CBC
MCHC: 33.7 g/dL (ref 30.0–36.0)
MCV: 85.2 fL (ref 78.0–100.0)
Platelets: 138 10*3/uL — ABNORMAL LOW (ref 150–400)
RDW: 22.1 % — ABNORMAL HIGH (ref 11.5–15.5)
WBC: 14.9 10*3/uL — ABNORMAL HIGH (ref 4.0–10.5)

## 2010-05-13 LAB — URINALYSIS, ROUTINE W REFLEX MICROSCOPIC
Bilirubin Urine: NEGATIVE
Ketones, ur: NEGATIVE mg/dL
Nitrite: NEGATIVE
Protein, ur: 30 mg/dL — AB
Specific Gravity, Urine: 1.02 (ref 1.005–1.030)
Urobilinogen, UA: 0.2 mg/dL (ref 0.0–1.0)

## 2010-05-14 LAB — HEMOGLOBIN A1C: Mean Plasma Glucose: 131 mg/dL

## 2010-05-14 LAB — CBC
HCT: 32.2 % — ABNORMAL LOW (ref 39.0–52.0)
HCT: 38.1 % — ABNORMAL LOW (ref 39.0–52.0)
Hemoglobin: 10.8 g/dL — ABNORMAL LOW (ref 13.0–17.0)
MCHC: 33.8 g/dL (ref 30.0–36.0)
MCHC: 34.2 g/dL (ref 30.0–36.0)
MCV: 83.6 fL (ref 78.0–100.0)
MCV: 83.8 fL (ref 78.0–100.0)
MCV: 84.6 fL (ref 78.0–100.0)
Platelets: 167 10*3/uL (ref 150–400)
Platelets: 186 10*3/uL (ref 150–400)
Platelets: 218 10*3/uL (ref 150–400)
RBC: 3.78 MIL/uL — ABNORMAL LOW (ref 4.22–5.81)
RBC: 3.82 MIL/uL — ABNORMAL LOW (ref 4.22–5.81)
RBC: 4.04 MIL/uL — ABNORMAL LOW (ref 4.22–5.81)
RDW: 18.1 % — ABNORMAL HIGH (ref 11.5–15.5)
RDW: 18.5 % — ABNORMAL HIGH (ref 11.5–15.5)
WBC: 19.1 10*3/uL — ABNORMAL HIGH (ref 4.0–10.5)
WBC: 20.1 10*3/uL — ABNORMAL HIGH (ref 4.0–10.5)
WBC: 26.5 10*3/uL — ABNORMAL HIGH (ref 4.0–10.5)

## 2010-05-14 LAB — BASIC METABOLIC PANEL
BUN: 24 mg/dL — ABNORMAL HIGH (ref 6–23)
BUN: 27 mg/dL — ABNORMAL HIGH (ref 6–23)
BUN: 49 mg/dL — ABNORMAL HIGH (ref 6–23)
CO2: 22 mEq/L (ref 19–32)
Calcium: 8.8 mg/dL (ref 8.4–10.5)
Calcium: 9.1 mg/dL (ref 8.4–10.5)
Calcium: 9.2 mg/dL (ref 8.4–10.5)
Chloride: 105 mEq/L (ref 96–112)
Chloride: 106 mEq/L (ref 96–112)
Chloride: 109 mEq/L (ref 96–112)
Creatinine, Ser: 1.92 mg/dL — ABNORMAL HIGH (ref 0.4–1.5)
Creatinine, Ser: 1.97 mg/dL — ABNORMAL HIGH (ref 0.4–1.5)
Creatinine, Ser: 2.05 mg/dL — ABNORMAL HIGH (ref 0.4–1.5)
GFR calc Af Amer: 44 mL/min — ABNORMAL LOW (ref >60)
GFR calc Af Amer: 47 mL/min — ABNORMAL LOW (ref >60)
GFR calc Af Amer: 49 mL/min — ABNORMAL LOW (ref >60)
GFR calc non Af Amer: 36 mL/min — ABNORMAL LOW (ref >60)
GFR calc non Af Amer: 40 mL/min — ABNORMAL LOW (ref >60)
GFR calc non Af Amer: 41 mL/min — ABNORMAL LOW (ref >60)
Glucose, Bld: 203 mg/dL — ABNORMAL HIGH (ref 70–99)
Glucose, Bld: 96 mg/dL (ref 70–99)
Potassium: 4.4 mEq/L (ref 3.5–5.1)
Potassium: 5.2 mEq/L — ABNORMAL HIGH (ref 3.5–5.1)
Sodium: 137 mEq/L (ref 135–145)
Sodium: 143 mEq/L (ref 135–145)

## 2010-05-14 LAB — URINALYSIS, ROUTINE W REFLEX MICROSCOPIC
Glucose, UA: NEGATIVE mg/dL
Leukocytes, UA: NEGATIVE
Protein, ur: 100 mg/dL — AB
Specific Gravity, Urine: 1.02 (ref 1.005–1.030)
Urobilinogen, UA: 0.2 mg/dL (ref 0.0–1.0)

## 2010-05-14 LAB — GLUCOSE, CAPILLARY
Glucose-Capillary: 132 mg/dL — ABNORMAL HIGH (ref 70–99)
Glucose-Capillary: 142 mg/dL — ABNORMAL HIGH (ref 70–99)
Glucose-Capillary: 157 mg/dL — ABNORMAL HIGH (ref 70–99)
Glucose-Capillary: 190 mg/dL — ABNORMAL HIGH (ref 70–99)
Glucose-Capillary: 211 mg/dL — ABNORMAL HIGH (ref 70–99)
Glucose-Capillary: 235 mg/dL — ABNORMAL HIGH (ref 70–99)

## 2010-05-14 LAB — DIFFERENTIAL
Basophils Absolute: 0 10*3/uL (ref 0.0–0.1)
Basophils Absolute: 0 10*3/uL (ref 0.0–0.1)
Basophils Relative: 0 % (ref 0–1)
Basophils Relative: 0 % (ref 0–1)
Basophils Relative: 0 % (ref 0–1)
Eosinophils Absolute: 0 10*3/uL (ref 0.0–0.7)
Eosinophils Absolute: 0 10*3/uL (ref 0.0–0.7)
Eosinophils Relative: 0 % (ref 0–5)
Lymphocytes Relative: 7 % — ABNORMAL LOW (ref 12–46)
Lymphs Abs: 0.6 10*3/uL — ABNORMAL LOW (ref 0.7–4.0)
Lymphs Abs: 1 10*3/uL (ref 0.7–4.0)
Lymphs Abs: 1.5 10*3/uL (ref 0.7–4.0)
Monocytes Relative: 3 % (ref 3–12)
Monocytes Relative: 3 % (ref 3–12)
Monocytes Relative: 4 % (ref 3–12)
Monocytes Relative: 5 % (ref 3–12)
Neutro Abs: 17.1 10*3/uL — ABNORMAL HIGH (ref 1.7–7.7)
Neutro Abs: 18.4 10*3/uL — ABNORMAL HIGH (ref 1.7–7.7)
Neutro Abs: 24.9 10*3/uL — ABNORMAL HIGH (ref 1.7–7.7)
Neutrophils Relative %: 91 % — ABNORMAL HIGH (ref 43–77)
Neutrophils Relative %: 94 % — ABNORMAL HIGH (ref 43–77)
Neutrophils Relative %: 94 % — ABNORMAL HIGH (ref 43–77)
Neutrophils Relative %: 95 % — ABNORMAL HIGH (ref 43–77)

## 2010-05-14 LAB — GRAM STAIN

## 2010-05-14 LAB — URINE MICROSCOPIC-ADD ON

## 2010-05-14 LAB — SYNOVIAL CELL COUNT + DIFF, W/ CRYSTALS: Other Cells-SYN: 0

## 2010-05-14 LAB — TSH: TSH: 1.828 u[IU]/mL (ref 0.350–4.500)

## 2010-05-14 NOTE — Discharge Summary (Signed)
NAME:  Jack Irwin, Jack Irwin            ACCOUNT NO.:  [id number]  MEDICAL RECORD NO.:  [id number]           PATIENT TYPE:  I  LOCATION:  [id number]                          FACILITY:  [facility]  PHYSICIAN:  Rexene Alberts, M.D.    DATE OF BIRTH:  01-04-1966  DATE OF ADMISSION:  05/07/2010 DATE OF DISCHARGE:  04/06/2012LH                              DISCHARGE SUMMARY   DISCHARGE DIAGNOSES: 1. Chronic tachycardia.  The patient's tachycardia is sinus.  He has     had chronic tachycardia for at least 2 years.  Despite what was     dictated previously, he has been extensively evaluated for this.     His sinus tachycardia is thought to be multifactorial in origin     including anemia, acute gout flare-up, increased adrenergic output,     and noncompliance with metoprolol.  His heart rate is     generally controlled on metoprolol b.i.d.  A 24-hour urine for     metanephrines was not done, but was ordered. 2. Abdominal pain, secondary to gastritis, erosive esophagitis, and     chronic constipation. 3. Erosive/ulcerative reflux esophagitis, sliding hiatal hernia, and     nonerosive antral gastritis per EGD on May 09, 2010, by Dr.     Laural Golden. 4. Chronic normocytic anemia, likely secondary to gout and chronic     kidney disease. 5. Tophaceous gouty arthritis with mild exacerbation and severe     debility. 6. Stage III chronic kidney disease with mild renal insufficiency     acutely. 7. Steroid-induced hyperglycemia. 8. Chronic leukocytosis and intermittent fever, secondary to gouty     arthritis.  The patient has been extensively evaluated for chronic     leukocytosis and fever in the past.  The conclusion was that these     objective findings were secondary to severe gouty arthritis      when infection was ruled out.  DISCHARGE MEDICATIONS: 1. Ferrous sulfate 325 mg b.i.d. 2. Keflex 500 mg b.i.d. for 5 more days. 3. Protonix 40 mg b.i.d. before meals. 4. MiraLax laxative 17 grams b.i.d. 5.  Metoprolol. The dose was increased to 50 mg b.i.d. 6. Oxycodone/acetaminophen 5/325 mg one tablet every 6 hours as needed     for pain. 7. Prednisone taper to be tapered over the next 9 days as directed and     then p.r.n. per the direction of his primary care physician. 8. Calcium acetate 667 mg 2 capsules every Monday, Wednesday, and     Friday. 9. Colchicine 0.6 mg daily as needed for gout. 10.Lasix 40 mg daily as needed for swelling. 11.Uloric 40 mg daily.  DISCHARGE DISPOSITION:  The patient was discharged to home in improved and stable condition on May 10, 2010.  He has an appointment to follow up with his primary care physician Dr. [name] on May 17, 2010, at 11:45 a.m.  An appointment will be made for him to follow up with Dr. Laural Golden in 2-3 weeks.  CONSULTATIONS: 1. Hildred Laser, MD 2. Cristopher Estimable. Lattie Haw, MD, [facility]  PROCEDURE PERFORMED: 1. V/Q scan on May 08, 2010.  The results revealed normal  ventilation/perfusion lung scan. 2. Chest x-ray on May 07, 2010.  The results revealed moderate     enlargement of the heart.  Low lung volumes.  No pulmonary     infiltrates seen.  No pleural effusions.  Prominence of superior     mediastinum most likely reflecting a combination of low lung     volumes and supine positioning. 3. 2-D echocardiogram on May 08, 2010.  The results revealed left     ventricular size was normal.  There was mild concentric     hypertrophy.  Systolic function was normal.  Ejection fraction was     in the range of 55-60%.  Wall motion was normal.  There were no     regional wall motion abnormalities. 4. CT scan of the abdomen and pelvis without contrast on May 07, 2010.  The results revealed no acute abnormality on the CT of the     abdomen and pelvis.  No renal calculi.  No hydronephrosis.  The     appendix and terminal ileum appear normal.  Bilateral sacral     ileitis noted.  HISTORY OF PRESENT ILLNESS:  The patient is a 45 year old man  with a past medical history significant for tophaceous gout, chronic sinus tachycardia, morbid obesity, steroid-induced hyperglycemia, chronic kidney disease, and chronic anemia.  He presented to the emergency department on May 07, 2010, with a chief complaint of abdominal pain. In the emergency department, he was noted to be febrile with a temperature of 101 and tachycardic with a heart rate up to the 150s. His EKG revealed sinus tachycardia with a heart rate of 156 beats per minute.  His urinalysis revealed 30 protein, otherwise negative.  His WBC was elevated at 16.6.  His BUN was 36 and his creatinine was 3.25. The CT scan of his abdomen and pelvis was essentially negative with the exception of moderately fluid distended stomach.  He was admitted for further evaluation and management.  HOSPITAL COURSE: 1. ABDOMINAL PAIN.  The patient was started empirically on Protonix     given his frequent treatment with prednisone and intravenous     steroids for gout exacerbations.  As stated above, the CT scan of     his abdomen and pelvis was essentially negative with the exception     of a fluid distended stomach.  His lipase was moderately elevated     at 91.  His hepatic function panel was within normal limits.     Gastroenterologist Dr. Laural Golden was consulted for further evaluation.     He evaluated the patient and proceeded with an EGD.  The EGD     revealed no evidence of peptic ulcer disease, although there was     evidence of the erosive reflux esophagitis and non-erosive antral     gastritis.  Dr. Laural Golden increased the Protonix to 40 mg b.i.d.  He     ordered an H. pylori serology.  The result was pending at the time     of hospital discharge.  Dr. Laural Golden plans to see the patient back in     his office.  In the near future, a colonoscopy will be done     electively.  The patient was treated with MiraLax laxative.  He did     have one bowel movement.  His abdominal pain subsided prior  to     discharge. 2. CHRONIC NORMOCYTIC ANEMIA.  The patient has a history of anemia  dating back to 1 year ago.  His anemia is thought to be secondary     to chronic disease including gout and kidney disease.  His     hemoglobin drifted down to 8.1 prior to hospital discharge.  There     was no obvious evidence of acute GI bleeding or GU bleeding.  An     anemia panel was ordered, but the results were pending at the time     of hospital discharge.  He was discharged on ferrous sulfate 325 mg     b.i.d. empirically.  As stated above, the patient will hopefully     have a colonoscopy by Dr. Laural Golden in the near future. 3. CHRONIC SINUS TACHYCARDIA.  Despite what was dictated previously,     the patient has had multiple V/Q scans, multiple echocardiograms,     and multiple assessments of his thyroid function in the past.  The     conclusion was that the patient's sinus tachycardia was secondary     to a higher adrenergic output than the average person,     noncompliance with metoprolol, gout exacerbations, acute illnesses,     and anemia.  However, at baseline, when the patient is compliant     with metoprolol, his heart rate normalizes as it did during this     hospitalization.  Dr. Lattie Haw was consulted.  He evaluated the     patient and recommended increasing the dose of metoprolol.     A 2-D echocardiogram was ordered and it revealed preserved LV     function but with mild LVH.  A V/Q scan was ordered to rule out     PE and it was negative for PE.  His free T4     was within normal limits at 1.31 and his TSH was     within normal limits at 1.9.  A 24-hour urine was ordered     for metanephrines.  This was not done as the secretary put     in an order for a 24-hour uric acid level instead.  The     patient's blood pressure has never been malignant, although it has     been mildly to moderately elevated when he is noncompliant with     metoprolol.  The dose of metoprolol was changed  to 50 mg b.i.d.     which was still higher than his previous dose of 25 mg b.i.d. 4. TOPHACEOUS GOUTY ARTHRITIS WITH MILD EXACERBATION AND SEVERE     DEBILITY.  The patient was started on stress-dose intravenous     steroids.  The IV hydrocortisone was titrated off in favor of     prednisone.  Allopurinol was initially started but was discontinued     in favor of Uloric which he takes at home.  Colchicine was also     restarted.  The patient was discharged on a prednisone taper over     the next 9 days.  In my conversation with the patient, his primary     care physician Dr. [name] did order a rheumatological panel in his     office.  Dr. [name] also considered a referral to a     rheumatologist in [place].  However, an appointment was not     made.  I advised both the patient and his wife to discuss     a referral to a local rheumatologist for ongoing evaluation and     management with Dr. [name].  They both voiced understanding. 5. STAGE III CHRONIC KIDNEY DISEASE WITH MILD ACUTE RENAL     INSUFFICIENCY.  The patient's baseline creatinine is in the range     of 2.4 to 2.6.  On admission, it was 3.25.  He was started on     gentle IV fluids.  Prior to hospital discharge, his BUN was 53 and     his creatinine was 2.61.  The increase in his BUN was likely     secondary to intravenous steroid therapy. 6. LEUKOCYTOSIS AND FEVER.  The patient has chronic leukocytosis from     gouty arthritis and chronic intermittent steroid therapy.  He was     febrile on admission.  Blood cultures were ordered and were     negative x3 days.  His urinalysis did not reveal any signs of     infection.  His chest x-ray revealed no obvious pneumonia.     Nevertheless, he was started on Rocephin empirically.  As stated     above, steroid therapy was initiated as well.  His fever resolved.     However, his white blood cell count remained elevated as it had     been in the past.  He was discharged to home on  Keflex for     5 more days and a prednisone taper. 7. OBESITY AND SEVERE DEBILITY.  The patient is severely debilitated     from morbid obesity and gout.  A hospital bed was ordered.  Home     physical therapy was ordered as well.     Rexene Alberts, M.D.     DF/MEDQ  D:  05/10/2010  T:  05/11/2010  Job:  [id number]  cc:   Nolene Ebbs, M.D. [phone]  Hildred Laser, M.D. Fax: [phone]. Lattie Haw, MD, [facility] [address] [place], [place] [address]  Electronically Signed by Rexene Alberts M.D. on 05/14/2010 09:49:34 AM

## 2010-05-21 LAB — DIFFERENTIAL
Basophils Relative: 0 % (ref 0–1)
Lymphocytes Relative: 20 % (ref 12–46)
Lymphs Abs: 1.4 10*3/uL (ref 0.7–4.0)
Monocytes Absolute: 0.8 10*3/uL (ref 0.1–1.0)
Monocytes Relative: 12 % (ref 3–12)
Neutro Abs: 4.9 10*3/uL (ref 1.7–7.7)
Neutrophils Relative %: 67 % (ref 43–77)

## 2010-05-21 LAB — COMPREHENSIVE METABOLIC PANEL
Albumin: 3.3 g/dL — ABNORMAL LOW (ref 3.5–5.2)
Alkaline Phosphatase: 69 U/L (ref 39–117)
BUN: 14 mg/dL (ref 6–23)
Calcium: 8.9 mg/dL (ref 8.4–10.5)
Glucose, Bld: 110 mg/dL — ABNORMAL HIGH (ref 70–99)
Potassium: 3.8 mEq/L (ref 3.5–5.1)
Sodium: 139 mEq/L (ref 135–145)
Total Protein: 5.8 g/dL — ABNORMAL LOW (ref 6.0–8.3)

## 2010-05-21 LAB — URINE MICROSCOPIC-ADD ON

## 2010-05-21 LAB — POCT CARDIAC MARKERS
CKMB, poc: 1.2 ng/mL (ref 1.0–8.0)
Myoglobin, poc: 146 ng/mL (ref 12–200)
Troponin i, poc: <0.05 ng/mL (ref 0.00–0.09)

## 2010-05-21 LAB — CBC
HCT: 36.9 % — ABNORMAL LOW (ref 39.0–52.0)
MCHC: 32.6 g/dL (ref 30.0–36.0)
Platelets: 207 10*3/uL (ref 150–400)
RDW: 18.4 % — ABNORMAL HIGH (ref 11.5–15.5)

## 2010-05-21 LAB — URINALYSIS, ROUTINE W REFLEX MICROSCOPIC
Nitrite: NEGATIVE
Protein, ur: 100 mg/dL — AB
Specific Gravity, Urine: >1.03 — ABNORMAL HIGH (ref 1.005–1.030)
Urobilinogen, UA: 0.2 mg/dL (ref 0.0–1.0)

## 2010-05-23 ENCOUNTER — Ambulatory Visit (HOSPITAL_COMMUNITY): Payer: BC Managed Care – PPO

## 2010-05-28 ENCOUNTER — Ambulatory Visit (HOSPITAL_COMMUNITY)
Admission: RE | Admit: 2010-05-28 | Discharge: 2010-05-28 | Disposition: A | Discharge status: Home / Self Care | Payer: BC Managed Care – PPO | Source: Ambulatory Visit | Attending: Internal Medicine | Admitting: Internal Medicine

## 2010-05-28 DIAGNOSIS — M6281 Muscle weakness (generalized): Secondary | ICD-10-CM | POA: Insufficient documentation

## 2010-05-28 DIAGNOSIS — R262 Difficulty in walking, not elsewhere classified: Secondary | ICD-10-CM | POA: Insufficient documentation

## 2010-05-28 DIAGNOSIS — IMO0001 Reserved for inherently not codable concepts without codable children: Secondary | ICD-10-CM | POA: Insufficient documentation

## 2010-05-28 DIAGNOSIS — I10 Essential (primary) hypertension: Secondary | ICD-10-CM | POA: Insufficient documentation

## 2010-06-06 ENCOUNTER — Ambulatory Visit (HOSPITAL_COMMUNITY): Payer: BC Managed Care – PPO | Admitting: *Deleted

## 2010-06-06 IMAGING — CR DG HAND COMPLETE 3+V*R*
3 series · 3 of 3 positions shown · non-contrast
Comparison: 11/03/2007 and 09/08/2008

CLINICAL DATA: Right hand pain

RIGHT HAND - COMPLETE 3+ VIEW

[view not recorded (1 of 3)]
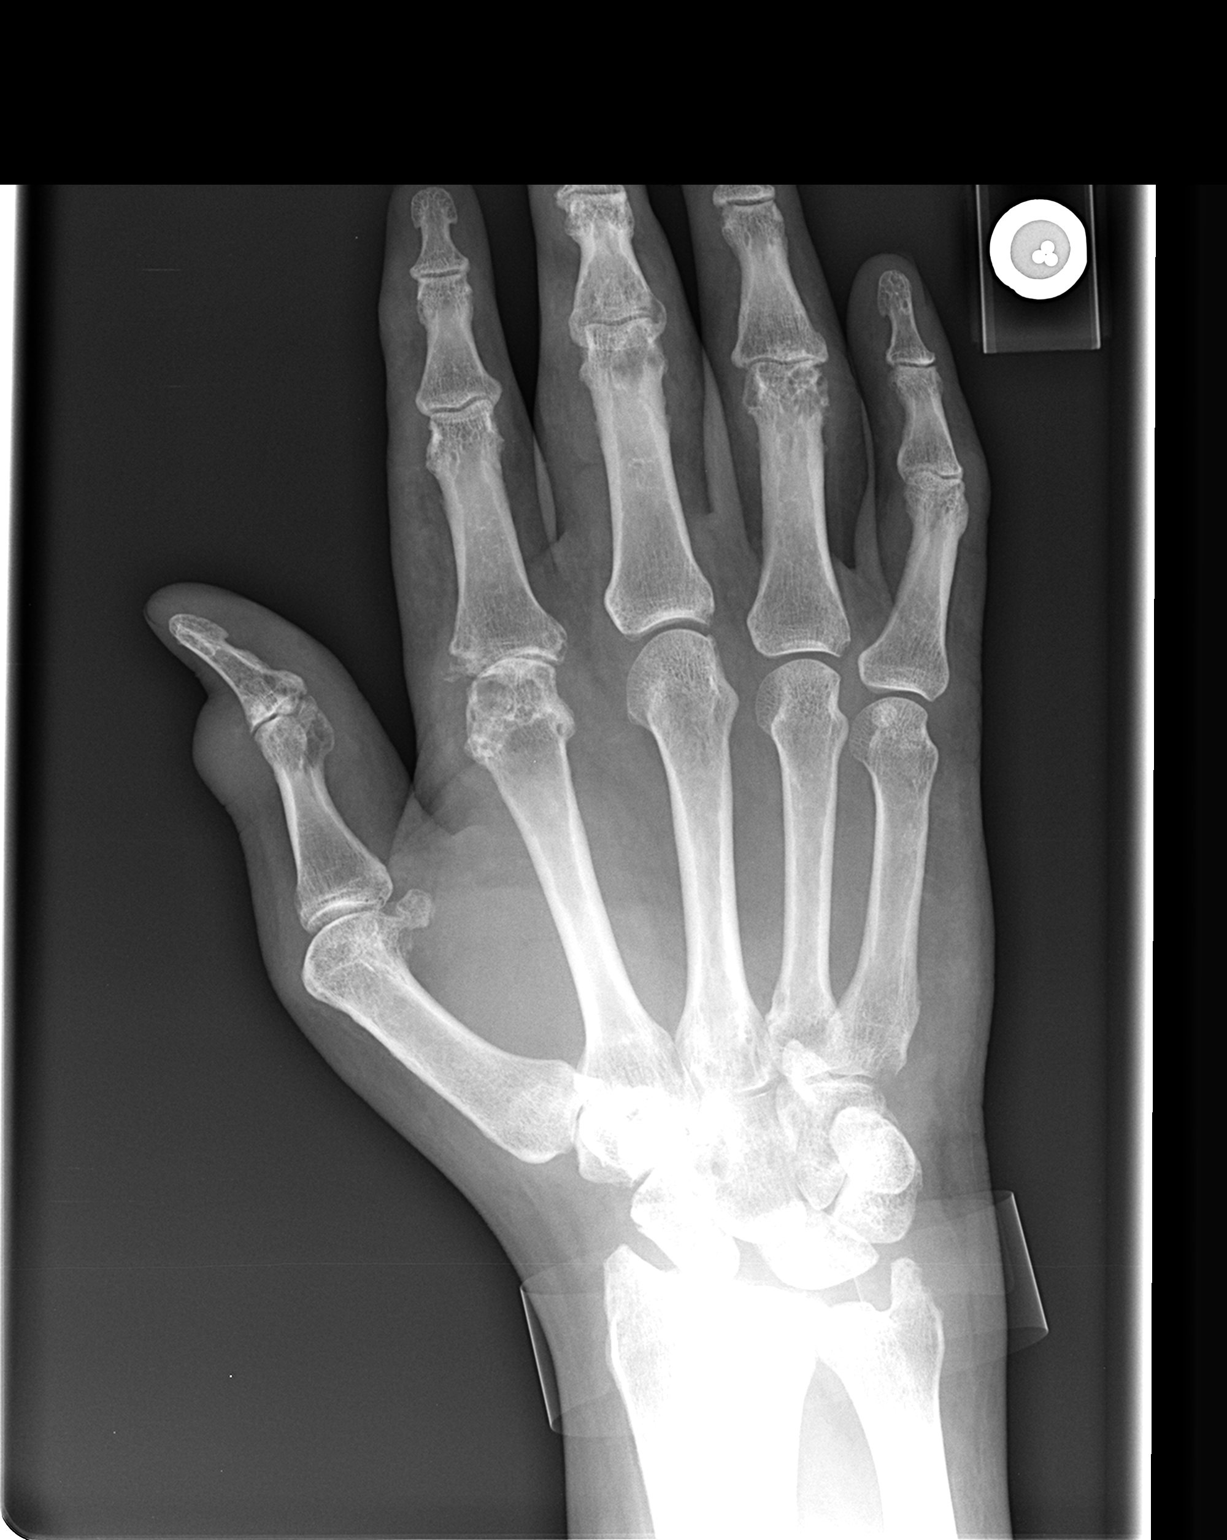

[view not recorded (2 of 3)]
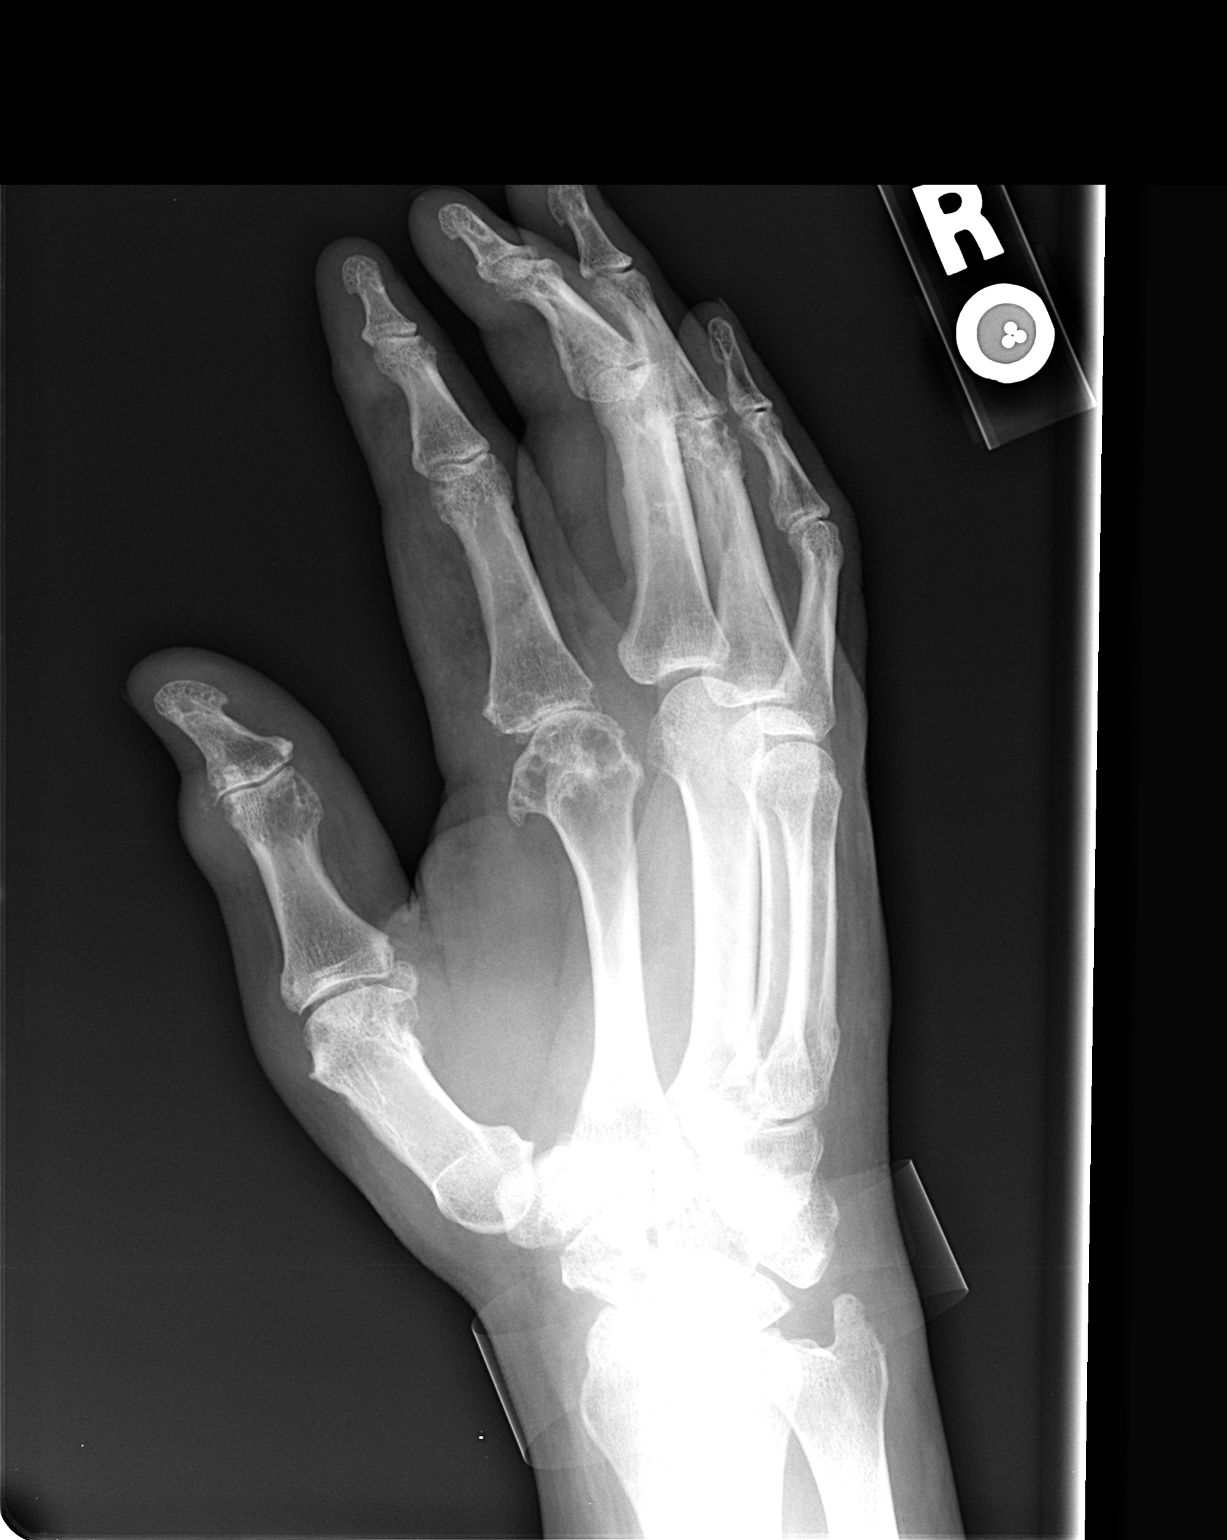

[view not recorded (3 of 3)]
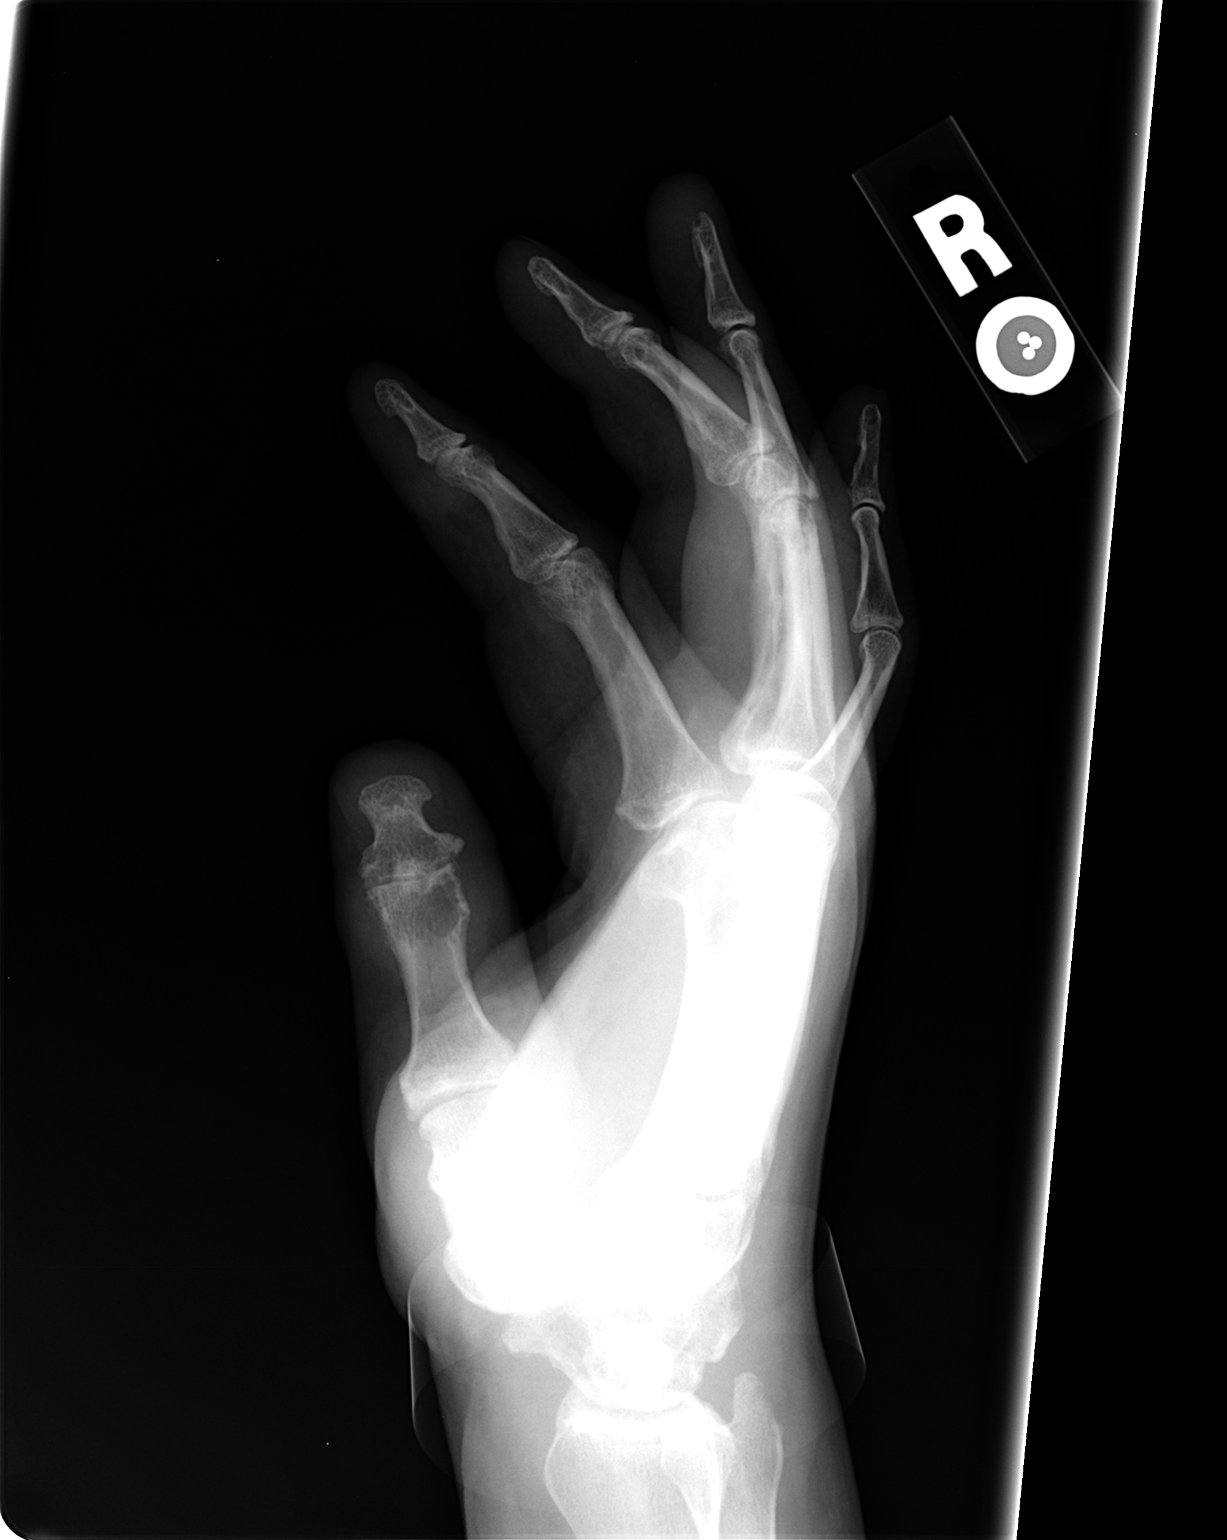

[3 of 3 positions shown; findings below may reference images not displayed]

FINDINGS: Gouty erosive changes throughout the right hand are again
noted, most prominently at the first MCP and interphalangeal
joints, second MCP joint, and second and fourth PIP joints.

Soft tissue swelling overlying the thumb interphalangeal joint is
noted.
There is no evidence of acute fracture or subluxation.
IMPRESSION: Gouty changes involving the right hand.

Increasing soft tissue swelling overlying the thumb interphalangeal
joint.

## 2010-06-10 NOTE — Op Note (Signed)
  NAME:  Jack Irwin, SCHNURR            ACCOUNT NO.:  [id number]  MEDICAL RECORD NO.:  [id number]           PATIENT TYPE:  I  LOCATION:  [id number]                          FACILITY:  [facility]  PHYSICIAN:  Hildred Laser, M.D.    DATE OF BIRTH:  12-12-1965  DATE OF PROCEDURE:  05/09/2010 DATE OF DISCHARGE:                              OPERATIVE REPORT   PROCEDURE:  Esophagogastroduodenoscopy.  INDICATION:  Rylin is 45 year old African American male with debilitating gout and multiple other medical problems who was admitted to this facility 2 days ago with shortness of breath and abdominal pain. His CT scan was negative.  His abdominal pain has improved since his bowels have move.  He had been constipated.  He did not have a BM for 7 days.  He had abdominopelvic CT which showed fluid-filled stomach and therefore concern was that he has peptic ulcer disease or stenosis.  He does complain of intermittent epigastric pain, frequent heartburn, and regurgitation.  He is undergoing diagnostic EGD.  Procedures were reviewed with the patient.  Informed consent was obtained.  MEDS FOR CONSCIOUS SEDATION:  Cetacaine spray for oropharyngeal topical anesthesia, Demerol 50 mg IV, Versed 10 mg IV.  FINDINGS:  Procedure performed in endoscopy suite.  The patient's vital signs and O2 sat were monitored during the procedure and remained stable.  The patient was placed in left lateral recumbent position and Pentax videoscope was passed through oropharynx without any difficulty into esophagus.  Esophagus.  Mucosa at the proximal and middle third was normal. Distally, there were two erosions and linear ulcer extending to GE junction.  This ulcer was about 10 mm long and 5.5 mm wide.  GE junction was located at 36 cm from the incisors and hiatus at 39.  Stomach.  Other than small amount of bile, it was empty.  Distended very well by insufflation.  Folds of proximal stomach are normal. Examination of mucosa and  body was normal.  In the antrum, particularly prepyloric region, there was some erythema also involved the pyloric channel, but pyloric channel was wide open.  Angularis, fundus, and cardia were examined by retroflexion of scope and were normal.  Duodenum.  Bulbar mucosa was normal.  Scope was passed into second part of the duodenum where mucosa and folds were normal.  Endoscope was withdrawn.  The patient tolerated the procedure well.  FINAL DIAGNOSES: 1. Erosive/ulcerative reflux esophagitis with 3-cm size sliding hiatal     hernia. 2. Nonerosive antral gastritis. 3. No evidence of peptic ulcer disease or pyloric stenosis.  RECOMMENDATIONS: 1. We will increase his Protonix to 40 mg b.i.d., but change it to     oral. 2. We will check an H. pylori serology. 3. As discussed with Dr. Caryn Section, we would also do anemia profile since     his hemoglobin is 8.7.     Hildred Laser, M.D.     NR/MEDQ  D:  05/09/2010  T:  05/10/2010  Job:  [id number]  cc:   Dr. Caryn Section  Electronically Signed by Hildred Laser M.D. on 06/10/2010 11:44:46 AM

## 2010-06-10 NOTE — Consult Note (Signed)
NAME:  SAIR, KARAU            ACCOUNT NO.:  [id number]  MEDICAL RECORD NO.:  [id number]           PATIENT TYPE:  LOCATION:                                 FACILITY:  PHYSICIAN:  Hildred Laser, M.D.    DATE OF BIRTH:  30-Nov-1965  DATE OF CONSULTATION:  05/08/2010 DATE OF DISCHARGE:                                CONSULTATION   REASON FOR CONSULTATION:  Fluid filled stomach on CT.  The patient admitted with abdominal pain, constipation as well as dyspnea.  HISTORY OF PRESENT ILLNESS:  Jack Irwin is a 45 year old African American male who has been in poor health because of multiple medical problems including debilitating gout, who presented to emergency room yesterday with abdominal pain, primarily in the lower abdomen but also in epigastric region as well as constipation and shortness of breath.  He was evaluated in the emergency room and noted to have a white cell count of 16.6.  His chest x-ray was negative for pneumonia and cardiomegaly. He also had abdominopelvic CT without IV contrast.  It showed no evidence of urolithiasis, higher attenuation debris layering within the gallbladder, either sludge or noncalcified stones.  Incidental finding of left adrenal adenoma.  Stomach moderately distended with fluid.  The patient denies shortness of breath today, he states he feels better.  He states he still has not had a bowel movement.  He is getting MiraLax and also had a dose of mag citrate today.  He states he has not had a bowel movement for 7 days.  He denies melena or rectal bleeding.  He says most of his pain has been in hypogastric region, described as crampy.  He also complains of intermittent epigastric pain.  He has frequent heartburn at least 4 to 5 times a week and he also has intermittent regurgitation but he denies dysphagia, odynophagia, nausea, vomiting, or hematemesis.  He states he has been essentially limited to bed and wheelchair because of polyarthralgia  because of his gout.  He has not been able to use his left hand for the last month or so.  He states he is getting physical therapy through [facility].  He states he is also not passing enough urine; however, he has not experienced any dysuria or hematuria.  He is not sure if he has lost any weight.  He has been on prednisone off and on for his gouty arthritis.  He states when he takes prednisone, it is generally for 7 days and he gradually tapers it off.  He has not been on this medicine for extended time period.  He also had a V/Q scan today which was negative.  The patient states that he recently was on a cruise for 5 days but was unable to eat much.  He says the last time he felt normal or able to do anything he wanted was in 1999.  At home, he has been on Uloric 40 mg daily, metoprolol 50 mg p.o. b.i.d., oxycodone question dose b.i.d. p.r.n., PhosLo 667 mg t.i.d., colchicine question dose, Lasix 40 mg p.o. daily.  CURRENT MEDICATIONS:  Allopurinol 100 mg p.o. daily, PhosLo 667 mg three  times a day, Rocephin 1 g IV q.24 h., Lovenox to be started in a.m. per pharmacy protocol, hydrocortisone 50 mg IV q.8, metoprolol 50 mg p.o. b.i.d., pantoprazole 40 mg IV q.24 h., MiraLax 17 g p.o. b.i.d.  He is also on IV fluids and morphine sulfate 4 mg IV q.4 p.r.n. pain.  PAST MEDICAL HISTORY:  He has had problems with obesity most of his life.  He has chronic kidney disease stage III and under care of Dr. Lowanda Foster.  History of tachycardia according to Dr. Maralyn Sago notes. History of DVT diagnosed last year, was treated with 6 months of Coumadin.  He has had gout for 10-15 years, has had multiple tophi.  He now has chronic swelling and deformity to his left hand and not able to use it.  He had moderate surgery on his right thumb last year, possibly had I and D or removal of the tophus.  ALLERGIES:  NK.  FAMILY HISTORY:  Father died of lung carcinoma at 20 and mother of MI also at 73.   He has 2 brother and half sister in good health.  He states on his father's side, multiple relatives had various cancers (grandfather, 2 uncles, and 2+ cousins).  SOCIAL HISTORY:  He is married.  He has two biologic children and two stepchildren.  Presently, he has been disabled.  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  He drank alcohol socially but not daily, but quit 6 years ago and he does not smoke cigarettes.  OBJECTIVE:  VITAL SIGNS:  Weight 128.7 kg, he is 71 inches tall, pulse 113 per minute and regular, blood pressure 133/90, temperature is 100.4, respiratory rate is 18. HEENT:  Conjunctivae are pink.  Sclerae are nonicteric.  Oropharyngeal mucosa is normal.  Dentition in satisfactory condition. NECK:  No neck masses or thyromegaly noted. CARDIAC:  Regular rhythm.  Normal S1 and S2.  No murmur or gallop noted. LUNGS:  Clear to auscultation. ABDOMEN:  Protuberant.  Bowel sounds are normal.  On palpation, soft abdomen with mild tenderness in hypogastric region as well as epigastric area.  No organomegaly or masses. RECTAL:  Deferred. EXTREMITIES:  No peripheral edema or clubbing noted.  He has swelling to PIPs and DIPs in his right hand and also first metacarpophalangeal joint.  He has swelling to multiple joints and deformity and some tophi involving his fingers of the left hand.  LAB DATA FROM ADMISSION:  WBC 16.6, H and H 10.5 and 33.8, platelet count is 345K.  Sodium 138, potassium 4.6, chloride 102, CO2 22, glucose was 103, BUN 36, creatinine 3.25, bilirubin 1.3, AP 57, SGOT 34, SGPT 15, total protein 6.9 with albumin of 2.6, calcium was 9.1.  Lab data from this morning, WBC 17.7, H and H is 9.1 and 29.1, platelet count is 350K, MCV 84.3, 83% segs.  INR 1.26, his BUN is 39, creatinine 3.0, total bilirubin is 0.7, albumin 2.2, calcium is 8.6, phosphorus 5.4.  His lipase today was 91, he did  not have on admission.  Urinalysis on admission revealed protein 30 mg/dL.  Tox screen positive for opiates only.  Abdominopelvic CT reviewed.  Stomach is filled with contrast and fluid with air fluid level and some contrast seen in proximal small bowel but no dilation noted.  Bile duct is not dilated.  Similarly, loops of small and large bowel are not dilated or wall thickening.  ASSESSMENT:  Jack Irwin is  an unfortunate 45 year old African American male with debilitating gouty arthritis who was admitted yesterday with shortness of breath and abdominal pain.  His V/Q scan was negative. Chest x-ray was negative for pneumonia.  He has a low grade fever and leukocytosis and is on empiric antibiotic therapy.  He is also struggling with ongoing swelling and pain in multiple small joints, particularly in left hand because of gout.  This certainly could be the source of his leukocytosis as well as low grade fever.  Abdominal CT without IV contrast revealed air fluid level in the stomach with fluid and contrast.  This is possibly insignificant finding but since he has had frequent regurgitation and heartburn, it would be reasonable to examine his upper GI tract to make sure he does not have peptic ulcer disease.  He is also at risk for GERD and gastroparesis given his underlying chronic kidney disease.  His lower abdominal pain may be due to constipation.  It remains to be seen if this pain would resolve once his bowels start moving.  He is anemic with normal MCV.  This anemia is possibly chronic.  We will look at old records to see if he has had anemia profile, etc.; otherwise, it would be considered.  RECOMMENDATIONS:  I agree with using MiraLax and PPI.  Unless his bowels move this evening, we will give him one Fleet's Enema.  Diagnostic esophagogastroduodenoscopy to be performed in a.m.  I have reviewed the procedure risks with the patient and he is agreeable.  Please note that the  patient is extremely concerned that he has rheumatoid arthritis in addition to gouty arthritis.  He wants me to do blood test.  I told him that I would pass this information on to Dr. Caryn Section.  We appreciate the opportunity to participate in the care of this gentleman.     Hildred Laser, M.D.     NR/MEDQ  D:  05/08/2010  T:  05/09/2010  Job:  [id number]  Electronically Signed by Hildred Laser M.D. on 06/10/2010 11:44:20 AM

## 2010-06-11 ENCOUNTER — Ambulatory Visit (HOSPITAL_COMMUNITY): Payer: BC Managed Care – PPO | Admitting: Physical Therapy

## 2010-06-13 ENCOUNTER — Ambulatory Visit (HOSPITAL_COMMUNITY): Payer: BC Managed Care – PPO | Admitting: Physical Therapy

## 2010-06-18 ENCOUNTER — Ambulatory Visit (HOSPITAL_COMMUNITY): Payer: BC Managed Care – PPO

## 2010-06-18 NOTE — Group Therapy Note (Signed)
NAME:  Jack Irwin, Jack Irwin NO.:  [id number]   MEDICAL RECORD NO.:  [id number]          PATIENT TYPE:  INP   LOCATION:  A325                          FACILITY:  [facility]   PHYSICIAN:  Carole Civil, M.D.DATE OF BIRTH:  1965-05-27   DATE OF PROCEDURE:  06/23/2008  DATE OF DISCHARGE:                                 PROGRESS NOTE   Genella Rife lab results from knee aspiration and his white count  today is 24.2.  His BUN and creatinine are 49 and 1.97.  Gram-stain  showed no organisms from multiple white cells and his fluid aspirate  showed 47,000 white cells, 96% neutrophils, and extracellular monosodium  urate crystals consistent with gout.   The patient was injected in the joint and should continue treatment for  gout.      Carole Civil, M.D.  Electronically Signed     SEH/MEDQ  D:  06/23/2008  T:  06/23/2008  Job:  [id number]

## 2010-06-18 NOTE — Group Therapy Note (Signed)
NAME:  Jack Irwin, Jack Irwin            ACCOUNT NO.:  [id number]   MEDICAL RECORD NO.:  [id number]          PATIENT TYPE:  INP   LOCATION:  A325                          FACILITY:  [facility]   PHYSICIAN:  Anselmo Pickler, DO    DATE OF BIRTH:  Jun 30, 1965   DATE OF PROCEDURE:  DATE OF DISCHARGE:                                 PROGRESS NOTE   I reviewed the patient's blood work and his white count is elevated. I  am awaiting orthopedic input to see if they do want to aspirate his  knee.  I will hold off on any antibiotics until that is done.   VITALS:  His current vitals are as follows:  Temperature 98.6, pulse  113, respirations 20, blood pressure 146/100.  GENERAL:  This is an obese [place] American male who is in no acute  distress.  HEAD:  Normal size, atraumatic.  HEART:  Regular rate and rhythm, tachy sinus noted.  LUNGS:  Clear to auscultation bilaterally.  ABDOMEN:  Soft, nontender, nondistended and pendulous.  EXTREMITY:  His right knee is swollen and tender to the touch.   LABS:  For today are as follows.  White count 26.5, hemoglobin 0.6,  hematocrit 33.9, platelet count of 204.  His sodium is 137, potassium  5.1, chloride 106, CO2 23, glucose 165, BUN 38, creatinine 1.92.   ASSESSMENT/PLAN:  1. Acute gouty attack of the right knee.  2. Leukocytosis, possibly contributed to by steroid use and infectious      process.  3. Anemia.  4. Diabetes es over or impaired fasting glucose.  5. Renal failure.  6. Elevated blood pressure, uncontrolled hypertension.   PLAN:  The plan will be to await the consultation of Dr. Aline Brochure on his  knee as mentioned above.  We will also review his blood pressure  medications and change them accordingly.  If Dr. Aline Brochure has decided to  aspirate his knee, I would also recommend that he start him on  antibiotics as well.  For his renal failure, we will continue to  cautiously hydrate him.  Apparently, this is chronic.  For his impaired  fasting  glucose, I have ordered a hemoglobin A1c.  We will continue to  monitor him and change therapy as necessary.      Anselmo Pickler, DO  Electronically Signed     CB/MEDQ  D:  06/22/2008  T:  06/22/2008  Job:  [id number]

## 2010-06-18 NOTE — Consult Note (Signed)
NAME:  DEKEN, DORFMAN            ACCOUNT NO.:  [id number]   MEDICAL RECORD NO.:  [id number]           PATIENT TYPE:   LOCATION:                                 FACILITY:   PHYSICIAN:  Marissa Nestle, M.D.DATE OF BIRTH:  1965/04/09   DATE OF CONSULTATION:  11/28/2007  DATE OF DISCHARGE:                                 CONSULTATION   PREOPERATIVE DIAGNOSIS:  Right scrotal abscess.   POSTOPERATIVE DIAGNOSIS:  Right scrotal abscess.   PROCEDURE:  Incision and drainage of scrotal abscess.   ANESTHESIA:  1% Xylocaine, 5 mL.   DESCRIPTION OF PROCEDURE:  The patient was placed in supine position,  frog-leg position.  The abscess area, which was located on the lateral  side of the right hemiscrotum was exposed.  After usual prep and drape,  the overlying skin infiltrated with 5 mL of 1% Xylocaine.  Then, the  area was covered with drapes and skin incision about 1.5 cm right over  the abscess was made and purulent drainage about 25 mL came out.  It was  cultured and this abscess cavity was explored with a hemostat and  cleaned with 4 x 4 and the area was dressed.  The patient left the  procedure area in satisfactory condition.      Marissa Nestle, M.D.  Electronically Signed     MIJ/MEDQ  D:  11/28/2007  T:  11/29/2007  Job:  [id number]

## 2010-06-18 NOTE — Consult Note (Signed)
NAME:  Jack Irwin, CRUZHERNANDEZ            ACCOUNT NO.:  [id number]   MEDICAL RECORD NO.:  [id number]           PATIENT TYPE:   LOCATION:                                 FACILITY:   PHYSICIAN:  Marissa Nestle, M.D.DATE OF BIRTH:  23-Nov-1965   DATE OF CONSULTATION:  11/28/2007  DATE OF DISCHARGE:                                 CONSULTATION   CHIEF COMPLAINT:  Right scrotal mass.   HISTORY:  A 45 year old gentleman presented in the emergency room with a  painful mass in the right scrotal wall.  I was called in to see this  patient who had a testicular ultrasound and showed there is an abscess  in the right hemiscrotum.  No urinary complaints or any other problem.  He has hypertension, well controlled with medication and diabetes, which  was controlled with diet.  He is taking several medication for  arthritis, which include allopurinol, colchicine, and prednisone.  He  takes enalapril for his hypertension and takes Viagra on p.r.n. basis.   PAST MEDICAL HISTORY:  1. Hypertension.  2. Gout.  3. Diabetes controlled with diet.  4. Family history of hypertension.  5. Never had any surgery.   PERSONAL HISTORY:  Does not smoke or drink.  No drug abuse.   REVIEW OF SYSTEMS:  Unremarkable.   PHYSICAL EXAMINATION:  GENERAL:  Moderately obese male, not in acute  distress.  VITAL SIGNS:  Blood pressure 149/110, temperature 97.7.  ABDOMEN:  Soft and flat.  Liver, spleen, and kidneys are not palpable.  PELVIC:  External genitalia is circumcised.  Both testicles are normal.  There is a 4-cm abscess in the right scrotum on the lateral aspect,  tender and fluctuant.   LABORATORY DATA:  Sodium 137, potassium 4.2, chloride 106, CO2 is 23,  glucose 135, BUN is 24, creatinine 1.7.  WBC count 19.3, hematocrit  39.3.  Scrotal ultrasound as I mentioned above.   IMPRESSION:  Right scrotal abscess.   PLAN:  Incision and drainage procedure explained.  He understands and  wants me to go ahead and  proceed.      Marissa Nestle, M.D.  Electronically Signed     MIJ/MEDQ  D:  11/28/2007  T:  11/28/2007  Job:  [id number]

## 2010-06-18 NOTE — H&P (Signed)
NAME:  Jack Irwin, Jack Irwin            ACCOUNT NO.:  [id number]   MEDICAL RECORD NO.:  [id number]          PATIENT TYPE:  INP   LOCATION:  A325                          FACILITY:  [facility]   PHYSICIAN:  Bonnielee Haff, MD     DATE OF BIRTH:  12/12/1965   DATE OF ADMISSION:  06/20/2008  DATE OF DISCHARGE:  LH                              HISTORY & PHYSICAL   PRIMARY MEDICAL DOCTOR:  Dr. Sharilyn Sites with [facility]   ADMITTING DIAGNOSES:  1. Bilateral upper thigh pain, etiology unclear.  2. Inability to walk because of #1.  3. Acute gouty inflammation, right knee.  4. History of gout.  5. Morbid obesity.  6. Hypertension.   CHIEF COMPLAINT:  Unable to walk.   HISTORY OF PRESENT ILLNESS:  The patient is a 45 year old morbidly obese  African American male who says that he went to the church this Sunday  morning, was fine except that he had a limp.  He came back from the  church, started experiencing pain in his medial thigh area on both  sides.  Since then, he has been unable to walk.  He is unable to bear  weight.  Pain goes to 10/10 in intensity when he tries to get up.  It is  sharp in character.  No radiation of the pain anywhere, no nausea,  vomiting, diarrhea, no fever or chills.  He has been having knee  swelling for the last 1 week which he attributes to gout.  He denies any  trauma, denies any falls.   MEDICATIONS AT HOME:  1. Colchicine 0.6 mg once a day.  2. Enalapril 20 mg once a day.  3. Viagra as needed.  4. Vicodin 7.5 as needed.  5. Lotrisone ointment b.i.d. to affected areas.  6. Allopurinol 300 mg daily.  7. He is on a steroid taper right now with prednisone.   ALLERGIES:  No known drug allergies.   PAST MEDICAL HISTORY:  Positive for chronic pain, gout, hypertension,  morbid obesity, psoriasis.  He had a cyst removal a few months ago.  He  actually had a right scrotal mass that was actually a scrotal abscess  that was drained in October  2009.   SOCIAL HISTORY:  Lives in [place] with his wife.  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  No smoking, no alcohol use.  No illicit  drug use.  He was independent before all this happened.   FAMILY HISTORY:  Positive for diabetes.   REVIEW OF SYSTEMS:  GENERAL:  Positive for weakness.  HEENT:  Unremarkable.  CARDIOVASCULAR:  Unremarkable.  RESPIRATORY:  Unremarkable.  GI:  Unremarkable.  GU:  Unremarkable.  NEUROLOGIC:  Unremarkable.  PSYCHIATRIC:  Unremarkable.  MUSCULOSKELETAL:  As in HPI.  DERMATOLOGIC:  Unremarkable.  Other systems are unremarkable.   PHYSICAL EXAMINATION:  VITAL SIGNS:  Temperature 97.9, blood pressure  148/108, heart rate 110, respiratory rate 20, saturation 96% on room  air.  GENERAL:  This is a morbidly obese African American male in no distress.  HEENT:  There is no pallor, no  icterus.  Oral mucous membranes are  moist.  No lesions are noted.  NECK:  Soft, supple.  He has some cushingoid features.  No thyromegaly,  no cervical lymphadenopathy.  LUNGS:  Clear to auscultation bilaterally.  CARDIOVASCULAR:  S1, S2 normal, regular.  No murmurs appreciated.  No  S3, no S4, no rubs, no bruits, no pedal edema.  Peripheral pulses are  palpable.  ABDOMEN:  Soft, nontender, nondistended.  Bowel sounds are present.  No  mass or organomegaly appreciated.  GU:  Does not reveal any obvious deformity.  NEUROLOGICALLY:  He is alert and oriented x3.  He is unable to lift  either of his legs.  MUSCULOSKELETAL:  He points to bilateral medial side when he mentions  his pain.  However, on palpation, I do not elicit any specific  tenderness.  I am trying to lift his leg up, and he experiences pain in  the medial aspect but does not point to the hip or the groin.  He does  have swelling of his right knee, and it is warm to touch with limited  range of motion.  Similar findings on the left side except for the knee.  Denies any back trouble.   LABORATORIES:   His white count is 20,100, hemoglobin 12.8, hematocrit  38, MCV 84, platelet count 167, BUN 27, creatinine 1.8.  UA shows trace  blood.  He had a CT scan of the abdomen and pelvis today which was  unremarkable except for some diverticulosis without diverticulitis.  He  had a knee film in November 2009 which showed severe tricompartmental  osteoarthritis with moderate joint effusion.   ASSESSMENT:  This is a 45 year old African American male with a history  of gout who seems to have acute gouty inflammation in the right knee but  more importantly is here for pain in the bilateral thigh area.  This  pain is up in the medial thighs.  Etiology is not very clear.  It is not  really in the groin.  He does use steroids on and off almost on every  monthly basis for his gout.  Patient could have a vascular necrosis of  his hips.  This could be some kind of a tendinous injury.  Since it is  bilateral, this could be the explanation.  He denies any trauma  recently, though this is somewhat of unclear etiology at this time.   PLAN:  1. Bilateral medial thigh pain.  I will obtain x-rays of both the      femurs to rule out any abnormality in those bones.  I will consult      Dr. [name] whom the patient has seen in the past to evaluate him.      I do not know if the patient will be a candidate for MRI or not.  I      will defer to Dr. [name].  Pain control will be provided in the      meantime.  Physical therapy will be obtained.  Please note that the      CT did not show any evidence for hip fracture.  2. Right knee pain, likely acute gout.  Uric acid level will be      checked.  We will put on prednisone t.i.d., colchicine t.i.d.      Allopurinol will be held for now.  3. Hypertension.  Continue with enalapril.  4. Psoriasis.  Continue with Lotrisone cream.  5. Morbid obesity.  6. Deep venous thrombosis  prophylaxis will be initiated.  7. Chronic kidney disease, stable.  He follows with  nephrologist.   He is a full code.   Further management decisions will depend on results of further testing  and patient's response to treatment.      Bonnielee Haff, MD  Electronically Signed     GK/MEDQ  D:  06/20/2008  T:  06/20/2008  Job:  [id number]   cc:   Halford Chessman, M.D.  Fax: [phone]

## 2010-06-18 NOTE — Procedures (Signed)
DUPLEX DEEP VENOUS EXAM - LOWER EXTREMITY   INDICATION:  Edema.   HISTORY:  Edema:  Yes.  Trauma/Surgery:  No.  Pain:  No.  PE:  No.  Previous DVT:  Yes.  Anticoagulants:  No.  Other:  Patient is wheelchair-bound.   DUPLEX EXAM:                CFV   SFV   PopV  PTV    GSV                R  L  R  L  R  L  R   L  R  L  Thrombosis    p  p  o  p  p  o  o   o  o  o  Spontaneous   +  +  +  +  +  +  +   +  +  +  Phasic        +  +  +  +  +  +  +   +  +  +  Augmentation  +  +  +  +  +  +  +   +  +  +  Compressible  p  p  +  p  +  +  +   +  +  +  Competent     +  +  +  o  o  +  +   +  +  +   Legend:  + - yes  o - no  p - partial  D - decreased   IMPRESSION:  1. Bilateral lower extremity deep veins show evidence of chronic      thrombus.  2. Clinically significant insufficiency noted in the right popliteal      vein and the left femoral vein at the mid thigh level.    _____________________________  Nelda Severe. Kellie Simmering, M.D.   EM/MEDQ  D:  02/05/2010  T:  02/05/2010  Job:  [id number]

## 2010-06-18 NOTE — Consult Note (Signed)
NEW PATIENT CONSULTATION   Jack Irwin, Jack Irwin  DOB:  January 17, 1966                                       02/05/2010  CHART#:[id number]   The patient is a 45 year old male patient who was referred for venous  insufficiency of both lower extremities.  The patient states that he  began having swelling in both legs following hospitalization in February  of 2011.  At that time he was found to have evidence of some old  thrombus bilaterally in his deep venous system and was started on  Coumadin which was continued until August.  He states the swelling has  gradually improved but has not resolved.  He does not elevate the legs  during the day or at night and is in a wheelchair most of the day with  his legs in a dependent position.  He has no history of superficial  thrombophlebitis, bleeding or stasis ulcer.   CHRONIC MEDICAL PROBLEMS:  1. Diabetes type 2.  2. Hypertension.  3. Gout arthropathy.  4. Urinary retention.  5. Sinus tachycardia.  6. Deconditioning.  7. Chronic renal insufficiency.  8. Negative for coronary artery disease or stroke.   SOCIAL HISTORY:  He is married, has 4 children, does not use tobacco or  alcohol.   FAMILY HISTORY:  Positive for asthma in his mother, diabetes in a  grandmother.  Negative for coronary artery disease or stroke.   REVIEW OF SYSTEMS:  Positive for lower extremity discomfort.  He walks  occasionally with a walker.  Does have arthritis joint pain, muscle  pain, palpitations, urinary frequency, chronic renal insufficiency.  All  other systems are negative in complete review of systems.   PHYSICAL EXAM:  Vital signs:  Blood pressure 167/108, heart rate 68,  respirations 20.  General:  He is a chronically ill-appearing obese male  who is in a wheelchair.  He is alert and oriented x3.  HEENT:  Exam is  normal for age.  EOMs intact.  Lungs:  Clear to auscultation.  No  rhonchi or wheezing.  Cardiovascular:  Exam reveals a  regular rhythm.  No murmurs.  Carotid pulses 3+.  No bruits.  Abdomen:  Obese.  No  palpable masses.  Musculoskeletal:  Exam is free of major deformities.  Neurologic:  Exam is normal.  Skin:  Free of rashes.  Lower extremity:  Exam reveals 3+ femoral and dorsalis pedis pulses bilaterally.  He has 1  to 2+ edema symmetrically from the knee to the ankles with some  puffiness in the dorsum of the feet.  There is no ulceration or  cellulitis or significant varicosities noted.   Today I ordered a venous duplex exam which I reviewed and interpreted.  He shows some evidence of some chronic old thrombus in his lower  extremities but no new thrombus.  He does have some valvular  incompetence in the deep system probably from an old DVT.   I think he does have venous insufficiency from valvular incompetence  from old deep venous thrombosis and should be treated with:  1. Elevation of his legs at night and during the day.  2. Bilateral lower extremity compression stockings.  We have offered      to fit him for a pair of stockings today and no other      recommendations were made at this time.  Nelda Severe Kellie Simmering, M.D.  Electronically Signed   JDL/MEDQ  D:  02/05/2010  T:  02/05/2010  Job:  [id number]   cc:   Nolene Ebbs, M.D.

## 2010-06-18 NOTE — Consult Note (Signed)
NAME:  DEBORAH, BRANSCOME NO.:  [id number]   MEDICAL RECORD NO.:  [id number]           PATIENT TYPE:   LOCATION:                                 FACILITY:   PHYSICIAN:  Marissa Nestle, M.D.DATE OF BIRTH:  1965/12/08   DATE OF CONSULTATION:  11/28/2007  DATE OF DISCHARGE:                                 CONSULTATION   Mr. Nott has incision and drainage done here under local anesthesia  in the ER and the dressing was applied.  I gave him the instruction to  let know if he has any fever or any bleeding.  I will see him in the  office on Tuesday.  He can take the dressing off tomorrow and shower and  clean, re-apply the clean dressing.   DISCHARGE MEDICATION:  I gave him Percocet 30 tablets 1 q.6 h. p.r.n.  and Levaquin 500 mg 1 a day.      Marissa Nestle, M.D.  Electronically Signed     MIJ/MEDQ  D:  11/28/2007  T:  11/29/2007  Job:  [id number]

## 2010-06-18 NOTE — Group Therapy Note (Signed)
NAME:  Jack Irwin, Jack Irwin            ACCOUNT NO.:  [id number]   MEDICAL RECORD NO.:  [id number]          PATIENT TYPE:  INP   LOCATION:  A325                          FACILITY:  [facility]   PHYSICIAN:  Audria Nine, M.D.DATE OF BIRTH:  1965/08/16   DATE OF PROCEDURE:  06/21/2008  DATE OF DISCHARGE:                                 PROGRESS NOTE   SUBJECTIVE:  The continues to have severe pain in the right knee.  The  patient has a history of chronic gout with morbid obesity.  The patient  apparently had the knee aspirated in the past by Dr. [name].  Since Dr.  [name] is currently out of town, we will request Dr. Aline Brochure to have a  look at it and see if it needs to be aspirated.   OBJECTIVE:  CONSTITUTIONAL:  The patient was conscious, alert, well  oriented to time, place, and person.  Had some difficulty understanding  basic instructions.  VITAL SIGNS:  Blood pressure is 145/99, pulse 120, respiratory rate of  24, temperature 101.1 degrees Fahrenheit.  Oxygen saturation 98% on room  air.  HEENT:  Normocephalic, atraumatic.  Oral mucosa was dry.  NECK:  Supple.  No JVD or lymphadenopathy.  LUNGS:  Markedly reduced air entry bilaterally.  HEART:  S1, S2.  A little bit tachycardic.  No S3, S4 gallops or rubs.  ABDOMEN:  Soft, obese, nontender.  Bowel sounds positive.  EXTREMITIES:  The patient had tenderness and swelling in his right knee,  which is warm to touch, with significantly decreased range of motion.  There might be some mild fluctuance in that knee.  There may be some  evidence of effusion.  CNS:  Exam was grossly intact.   LABORATORY/DIAGNOSTIC DATA:  X-ray of the knees shows only degenerative  joint disease in the knee greater than the hip.  He also has some  bilateral small knee joint effusions.  CT scan of the abdomen and pelvis  was really unremarkable, except for extensive diverticulosis.   ASSESSMENT AND PLAN:  1. Acute gouty inflammation of the right knee.  2.  Morbid obesity.  3. Bilateral thigh pain, unclear etiology.  4. History of psoriasis.  5. History of hypertension.  6. History of chronic kidney disease which is stable.   PLAN:  1. We will continue on his current therapy for his gout, including the      colchicine, and also prednisone at this time.  2. Would request Dr. Aline Brochure to see for possible aspiration of that      knee.  3. Will continue pain control.  4. Will mobilize the patient with physical therapy and occupational      therapy.   Once we mobilize the patient reasonably, I think he can be discharged  home.      Audria Nine, M.D.  Electronically Signed     AM/MEDQ  D:  06/21/2008  T:  06/21/2008  Job:  [id number]

## 2010-06-18 NOTE — Consult Note (Signed)
NAME:  Jack Irwin, Jack Irwin            ACCOUNT NO.:  [id number]   MEDICAL RECORD NO.:  [id number]          PATIENT TYPE:  INP   LOCATION:  A325                          FACILITY:  [facility]   PHYSICIAN:  Carole Civil, M.D.DATE OF BIRTH:  12-01-1965   DATE OF CONSULTATION:  06/22/2008  DATE OF DISCHARGE:                                 CONSULTATION   CONSULT REQUESTED BY:  Audria Nine, MD   REASON FOR CONSULTATION:  Pain and swelling in the right knee.   History and physical reviewed, chart reviewed, cooperated by reference.   REVIEW OF THE DETAILS OF THE ADMISSION:  This patient is followed by  [facility], presented with bilateral thigh pain with  unclear etiology; acute gait disturbance; unable to walk; acute presumed  gout, right knee; morbid obesity; and hypertension.   This is a 45 year old male who is morbidly obese, he is an Oncologist.  He was doing well except for limping over the last week.  His  pain increased in the medial thigh and groin area, and then he  progressively became unable to walk, described 10/10 pain, which were  sharp.  No fever or chills.  He has had the swelling for a week, which  is thought to be due to gout, previous history of injection and  aspiration by Dr. [name].  The patient is maintained on colchicine,  enalapril, Vicodin, Lotrisone, allopurinol.  He currently presented with  steroid, taper of prednisone.   ALLERGIES:  No known allergies.   PAST MEDICAL HISTORY:  Hypertension, obesity, psoriasis, previous  history of scrotal mass and abscess drained in October 2009.   SOCIAL HISTORY:  The patient is married, does not smoke, denies alcohol  use.  He says he has been eating the white foods.   FAMILY HISTORY:  There is a family history of diabetes.   REVIEW OF SYSTEMS:  GENERAL:  The patient complained of weakness.  EARS,  NOSE, AND THROAT:  Unremarkable.  CARDIOVASCULAR:  Normal.  RESPIRATORY:  Normal.  GI:   Normal.  GU:  Normal.  NEUROLOGIC:  Normal.  PSYCHIATRIC:  Normal.  DERMATOLOGIC:  Normal.   PHYSICAL EXAMINATION:  VITAL SIGNS:  On admission, his temperature was  97.9, today it is 98.6, blood pressure is 146/100 with a respiratory  rate of 20, pulse of 108-120, sat at room air is 94%.  GENERAL:  His appearance is notable for obesity, otherwise no deformity.  Grooming and hygiene are normal.  CARDIOVASCULAR:  Normal.  EXTREMITIES:  Pulse and perfusion to the lower extremities.  SKIN:  Warm, dry, and intact.  No cafe-au-lait spots, no rashes.  LYMPH:  Groin is without lymph nodes.  PSYCHIATRIC:  He is awake, alert, and oriented x3.  Mood and affect is  normal.  MUSCULOSKELETAL:  Right knee is held in flexion.  He has pain with any  rotation or movement of the knee.  Refuses further examination.  Left  knee has mild joint effusion unlike the right knee which has a major  joint effusion.   We aspirated the knee, got back 40 mL of cloudy amber  thick fluid,  appeared to be nonpurulent.  It was sent for analysis.  He was injected  with 40 mL of Depo-Medrol in the knee joint.   Recommend, x-ray, right knee.   Fluid analysis to determine etiology of the fluid.   DIFFERENTIAL DIAGNOSES:  1. Gout.  2. Infection.  3. Arthritic effusion.  4. Sympathetic effusion.   We will continue to follow.   LABORATORY DATA:  Potassium 5.1, uric acid on May 19 was 9.3 which is  high, normal being 7.8.  White count is 26.5 on the 20th, it was 20 on  admission.  Neutrophil count is 94%.      Carole Civil, M.D.  Electronically Signed     SEH/MEDQ  D:  06/22/2008  T:  06/23/2008  Job:  [id number]

## 2010-06-20 ENCOUNTER — Ambulatory Visit (HOSPITAL_COMMUNITY): Payer: BC Managed Care – PPO | Admitting: Physical Therapy

## 2010-06-21 NOTE — Discharge Summary (Signed)
NAME:  Jack Irwin, Jack Irwin            ACCOUNT NO.:  [id number]   MEDICAL RECORD NO.:  [id number]          PATIENT TYPE:  INP   LOCATION:  A325                          FACILITY:  [facility]   PHYSICIAN:  Audria Nine, M.D.DATE OF BIRTH:  1965/03/09   DATE OF ADMISSION:  06/20/2008  DATE OF DISCHARGE:  05/22/2010LH                               DISCHARGE SUMMARY   DISCHARGE DIAGNOSES:  1. Acute gouty inflammation of the right knee, status post      arthrocentesis.  2. Severe pain bilateral upper thigh pain with difficulty to ambulate,      possibly radiating pain from gout.  3. Morbid obesity.  4. Hypertension.  5. Leukocytosis likely related to acute gouty inflammation.  6. History of borderline diabetes mellitus.  7. History of chronic renal insufficiency.  8. History of poorly controlled hypertension.   DISCHARGE MEDICATIONS:  1. Allopurinol 300 mg p.o. once a day.  2. Colchicine 0.6 mg p.o. once daily.  3. Enalapril 20 mg once a day.  4. Viagra as needed.  5. Vicodin 7.5 mg as needed.  6. Lotrisone ointment b.i.d. to affected areas.  7. Steroid taper.   CONSULTATIONS:  Obtained during the course of hospitalization with Dr.  Arther Abbott.   REASON FOR CONSULTATION:  Evaluation of right knee swelling with need  for arthrocentesis.   PROCEDURES PERFORMED:  Right knee arthrocentesis with on results of  highly suggestive of gout with 47,000 white blood cells, 96% neutrophils  and evidence of urate crystals which was consistent with acute gout.  The patient had declined PICC line placement.   DIET:  Heart healthy 1800 ADA consistent carbohydrate.   ACTIVITY:  As tolerated.  Patient encouraged to ambulate and use pain  control as needed.   PERTINENT LABORATORY DATA:  During the course of hospitalization:  On  admission the patient's white blood count was 20,100  and this  eventually went up to 26,5000.  Hemoglobin was 12.8, hematocrit 38, MCV  was 84, platelet count was  167,000.  BUN was 27, creatinine was 1.8 and  urinalysis shows trace blood.  CT scan of the abdomen and pelvis was  unremarkable except for some diverticulosis without any evidence of  diverticulitis.  The patient's x-ray film from  2009 was reviewed which  shows severe tricompartmental osteoarthritis with moderate joint  effusion.   HOSPITAL COURSE:  <Jack Irwin is a 45 year old morbidly obese man who  apparently went to church the previous Sunday to admission but noted  that he had a limp.  The patient said after he had returned from church,  he had severe pain in his medial thigh area on both sides.  He has been  unable to wall or bear weight on the leg.  Reports the pain is 10/10 in  intensity, especially when he tries to get up.  He reports it being  sharp infarct in character and there is no radiation of the pain.  No  nausea or vomiting or diarrhea.  His review of systems was unremarkable.  His physical exam was noted to be temperature was 97.9, blood pressure  was 148/80,  heart rate was 110, respiration was 20, oxygen saturation  was 96% on room air.  The systems exam was unremarkable except for a  bilateral medial thigh tenderness which was really nonspecific, very  subjective.  The patient does have swelling of his right knee which was  warm to touch with evidence of effusion.  Left knee was unremarkable.   The patient's subsequently had x-rays which did not show any significant  other abnormality.  CT scan of his legs did not show any abnormality.  The patient was felt to have acute gouty arthritis which was confirmed  on the right knee aspiration Dr. Aline Brochure.   The patient then continue to improve.  The patient was started  empirically on antibiotics with vancomycin because of concern for  possible septic arthritis prior of the knee joint aspiration being  available.  He also had PICC line with expectation that he was not going  to have a prolonged home antibiotic  course.  After review of results  knee aspiration, the patient was found to have gout.  His antibiotics  were discontinued and his PICC line was removed.    I have also discussed in detail with the patient's wife, the patient had  acute gout and we discussed several options to help control or prevent  gout attacks in the future.   DISPOSITION:  The patient is being discharged home in satisfactory  condition.  The patient advised to complete his prednisone taper.      Audria Nine, M.D.  Electronically Signed     AM/MEDQ  D:  08/12/2008  T:  08/12/2008  Job:  [id number]

## 2010-06-21 NOTE — Procedures (Signed)
NAME:  Jack Irwin, Jack Irwin NO.:  [id number]   MEDICAL RECORD NO.:  [id number]          PATIENT TYPE:  OBV   LOCATION:  A224                          FACILITY:  [facility]   PHYSICIAN:  Scarlett Presto, M.D.   DATE OF BIRTH:  30-Mar-1965   DATE OF PROCEDURE:  01/15/2005  DATE OF DISCHARGE:  01/15/2005                                  ECHOCARDIOGRAM   TAPE NUMBER:  [id number]   TAPE COUNT:  [id number]   HISTORY OF PRESENT ILLNESS:  This is a 45 year old male with chest pain and  hypertension.  No previous echocardiogram.  Today's study is technically  difficult due to body habitus.   M-MODE TRACINGS:  Aorta 37 mm.   Left atrium 36 mm.   Septum 12 mm.   Posterior wall 15 mm.   Left ventricular diastolic dimension 41 mm.   Left ventricular systolic dimension 29 mm.   A 2D AND DOPPLER IMAGING:  Left ventricle is normal size with normal  systolic function.  Estimated ejection fraction 55-60%.  There is no wall  motion abnormality seen.  There is mild concentric left ventricular  hypertrophy.   Right ventricle is normal size and normal systolic function.   Both atria appear to be normal size.   The aortic valve is not well seen but appears to be functioning normally  with no stenosis or regurgitation.   The mitral valve has no stenosis or regurgitation.   The tricuspid valve has no stenosis or regurgitation.   There is no pericardial effusion.   The inferior vena cava appears to be normal size.   The ascending aorta was not well seen.      Scarlett Presto, M.D.  Electronically Signed     JH/MEDQ  D:  01/15/2005  T:  01/16/2005  Job:  [id number]

## 2010-06-21 NOTE — Consult Note (Signed)
NAME:  Jack Irwin, Jack Irwin            ACCOUNT NO.:  [id number]   MEDICAL RECORD NO.:  [id number]          PATIENT TYPE:  OBV   LOCATION:  A224                          FACILITY:  [facility]   PHYSICIAN:  Jacqulyn Ducking, M.D. LHCDATE OF BIRTH:  05/09/1965   DATE OF CONSULTATION:  01/14/2005  DATE OF DISCHARGE:                                   CONSULTATION   CARDIOLOGY CONSULTATION:   REFERRING PHYSICIAN:  Dr. Bonnielee Haff.   PRIMARY CARE PHYSICIAN:  Dr. Zenia Resides in the [facility]  Department.   HISTORY OF PRESENT ILLNESS:  A 45 year old gentleman with a 10-year history  of hypertension  referred for evaluation of chest discomfort. Mr. Folwell  has no known cardiovascular disease. He has never previously been seen by a  cardiologist nor undergone any significant cardiac testing. Blood pressure  control has been fairly good according to him. He has also had some chronic  renal insufficiency. He has not used tobacco products. There is no history  of diabetes. He is uncertain regarding lipid status.   For the past few days, he has experienced aching discomfort in his chest  associated with back pain. A typical episode lasts a matter of seconds.  There is an association with movement of the trunk. He has had no dyspnea,  diaphoresis nor nausea. Since entry to the hospital, chest discomfort has  been minimal, but back discomfort persists. A  CT scan of the chest was  obtained, but no comment was made regarding the thoracic or cervical spine.   PAST MEDICAL HISTORY:  Past medical history is otherwise notable for obesity  and gout. He has previously been evaluated by a nephrologist and been told  of proteinuria.   RECENT OUTPATIENT MEDICATIONS INCLUDED:  1.  Hydrochlorothiazide 25 mg daily.  2.  Colchicine 0.6 mg twice daily.  3.  Indomethacin 50 mg t.i.d.  4.  Enalapril 20 mg daily.  5.  Allopurinol 100 mg daily.  6.  Hydrocodone p.r.n.   ALLERGIES:  He has no known  medical allergies.   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 Has two children. No tobacco products nor alcohol.   FAMILY HISTORY:  Mother is alive with a history of asthma; father died due  to emphysema and carcinoma of the lung. Three siblings have no coronary  disease.   REVIEW OF SYSTEMS:  Review of systems is notable for occasional headaches,  intermittent pedal edema, mild dyspnea on exertion. All other systems  reviewed and are negative.   EXAMINATION:  GENERAL: Overweight pleasant gentleman in no acute distress.  VITAL SIGNS: The temperature is 98.6, heart rate 80 and regular,  respirations 20, blood pressure 140/95.  HEENT: Anicteric sclerae; pupils equal, round, react to light.  NECK: No jugular venous distension; normal carotid upstrokes without bruits.  ENDOCRINE: No thyromegaly.  HEMATOPOIETIC: No adenopathy.  SKIN: No significant lesions.  CARDIAC: Distant first and second heart sounds.  ABDOMEN: Soft and nontender; normal bowel sounds; no masses.  LUNGS: Decreased breath sounds at the bases.  EXTREMITIES: No edema; distal pulses  intact.  NEUROMUSCULAR: Symmetric strength and tone; normal cranial nerves.  MUSCULOSKELETAL: No joint deformities.   EKG:  Normal sinus rhythm; within normal limits.   LABORATORIES:  Other laboratories notable for normal CBC, BUN of 27 with a  creatinine of 2.1, normal LFTs, normal lipase, mildly elevated total CPK  with normal MB and troponin.   IMPRESSION:  Mr. Honkomp presents with atypical chest discomfort that is  not highly suggestive of myocardial ischemia. Based upon his history, it is  most likely that he is experiencing musculoskeletal back pain with radiation  to the chest. Nonetheless, with significant coronary risk factors, a  diagnostic study is warranted. We will perform a treadmill stress test in  the morning after myocardial infarction has been excluded.    Moderate renal dysfunction in a 45 year old gentleman is certainly of  concern. He might benefit from discontinuation of indomethacin and  adjustment of his colchicine dose downward. Prednisone could be used for  flares of gout, if any. Unless hypertension has been more severe than the  patient indicates, consideration should be given to workup for a primary  renal disorder such as a glomerulonephritis.   Control of hypertension is suboptimal. Enalapril is certainly the most  desirable drug. A thiazide diuretic may not be very effective at this level  of renal function. We will institute treatment with amlodipine pending his  stress test. After that, either a calcium channel antagonist or beta-blocker  could be used.   We greatly appreciate the request for consultation and will be happy to  assist in excluding significant cardiac issues in this very nice gentleman.      Jacqulyn Ducking, M.D. [facility]  Electronically Signed     RR/MEDQ  D:  01/14/2005  T:  01/14/2005  Job:  [id number]

## 2010-06-21 NOTE — Procedures (Signed)
NAME:  Jack Irwin, PALANGE NO.:  [id number]   MEDICAL RECORD NO.:  [id number]          PATIENT TYPE:  OBV   LOCATION:  A224                          FACILITY:  [facility]   PHYSICIAN:  Scarlett Presto, M.D.   DATE OF BIRTH:  [birth year]   DATE OF PROCEDURE:  01/15/2005  DATE OF DISCHARGE:                                    STRESS TEST   HISTORY:  Mr. Bensing is a 45 year old gentleman with no known coronary  disease, atypical chest discomfort and his cardiac enzymes were negative x3  for acute myocardial infarction.  Cardiac risk factors include hypertension  and no known lipid status.   BASELINE DATA:  Electrocardiogram reveals a sinus tachycardia at 133 beats  per minute.  Blood pressure is 130/90.   The patient exercised for a total of 5 minutes to 6.1 METS.  Maximum heart  rate was 169 beats per minute which is 92% of predicted maximum.  Maximum  blood pressure is 172/80 and resolved down to 120/72 in recovery.  Electrocardiogram revealed no significant ischemic changes and no  significant arrhythmias were noted.  The patient denied any chest discomfort  or shortness of breath with exercise.  Final results are pending MD review.      Cherre Blanc, P.A. [facility]      Scarlett Presto, M.D.  Electronically Signed    AB/MEDQ  D:  01/15/2005  T:  01/16/2005  Job:  [id number]

## 2010-06-21 NOTE — H&P (Signed)
NAME:  Jack Irwin, Jack Irwin NO.:  [id number]   MEDICAL RECORD NO.:  [id number]          PATIENT TYPE:  OBV   LOCATION:  A224                          FACILITY:  [facility]   PHYSICIAN:  Bonnielee Haff, MD     DATE OF BIRTH:  1965/11/21   DATE OF ADMISSION:  01/14/2005  DATE OF DISCHARGE:  LH                                HISTORY & PHYSICAL   PRIMARY CARE PHYSICIAN:  Dr. Zenia Resides at [facility] Department.   ADMISSION DIAGNOSES:  1.  Chest pain, atypical for cardiac etiology.  2.  Hypertension.  3.  Morbid obesity.  4.  Gout.   CHIEF COMPLAINT:  Chest pain and back pain for one week.   HISTORY OF PRESENT ILLNESS:  The patient is a 45 year old African-American  male who is a very poor historian. The patient mentions that he had onset of  his chest and back pain about one week ago. He says the pain is ultimately  described as aching and sharp in nature. Present mostly while he is at rest,  somewhat changes in intensity when he stands up or starts walking. No  shortness of breath. No palpitations. He does mention that sometimes he  feels his heart rate is slowing down. No history of diaphoresis. No history  of nausea, vomiting or lightheadedness. He is unable to tell me if the back  pain or the chest pain are the same or if they are completely different. The  patient denies change in intensity of the chest pain with moving his arms.  However, he does mention that his back pain appears to change in intensity  with movement of his arms. Denies any trauma to his back. Denies lifting any  heavy weight recently. Denies having similar complaints in the past. He has  never had any angiograms. He did mention a stress test more than 10 years  ago for unclear reasons. Denies any heartburn.   MEDICATIONS:  Medications at home include:  1.  Hydrochlorothiazide 25 mg once a day.  2.  Colchicine 0.6 mg b.i.d.  3.  Indomethacin 50 mg t.i.d.  4.  Enalapril 20 mg daily.  5.   Allopurinol 100 mg daily.  6.  Hydrocodone p.r.n. basis.   ALLERGIES:  No known drug allergies.   PAST MEDICAL HISTORY:  Significant for hypertension and gout, morbid  obesity. No surgeries int he past.   SOCIAL HISTORY:  The patient lives in [place]. 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 No history of smoking. No alcohol  use. No illicit drug use.   FAMILY HISTORY:  Father had emphysema, lung cancer and diabetes. No history  of coronary artery disease. Mother has asthma. No coronary artery disease.  His brothers have not had coronary artery disease. He has a maternal uncle  who died of heart attack at the age of 14.   REVIEW OF SYSTEMS:  Ten-point review of systems was done which was  unremarkable except for pains all over the joints, mostly in his wrist and  his feet because of his gout.   PHYSICAL EXAMINATION:  VITAL SIGNS:  Temperature is 97, blood pressure is  143/95 - presentation it was 111/86, heart rate was 102 at presentation -  currently about 80s, respiratory rate 16, saturating 99% on room air.  GENERAL:  This is a morbidly obese male in no apparent distress.  HEENT:  There is no pallor, no icterus. Oral mucosa is moist. No oral  lesions are seen.  NECK:  Soft and supple. No thyromegaly is appreciated.  LUNGS:  Poor air entry at the bases but no crackles or rhonchi or wheezing  present.  CARDIOVASCULAR:  S1 and S2 is normal, regular. No murmurs appreciated. No S3  or S3. No rubs.  ABDOMEN:  Obese, nontender, nondistended. No mass or organomegaly  appreciated.  EXTREMITIES:  Without edema. Peripheral pulses palpable.  NEUROLOGICAL:  The patient does not appear to have any gross deficits.   LABORATORY DATA:  CBC actually not really remarkable. PT/INR and PTT normal.  D-dimer elevated 2.29. Lipase 38. Total CK 289, other parameters normal,  troponin negative. BN peptide less than 30. Urine drug screen negative. No  metabolic profile  available.   EKG shows sinus rhythm with a normal axis. Intervals appear to be within  normal limits. Do not appreciate any Q waves. No concerning ST segment  changes appreciated; there is probably some early repolarization.   Chest x-ray has been done which showed borderline cardiomegaly, no acute  abnormalities were noted. In response to the elevated D-dimer, the patient  also had CT angiogram which showed no evidence for PE and no other  significant abnormalities.   IMPRESSION:  This is a 45 year old African-American male with past medical  history of hypertension, obesity, gout who presents with symptoms of chest  and back pain. His chest pain is not reproducible to palpation. However, his  back pain is somewhat reproducible to palpation. His pain is very atypical  for cardiac etiology. He did have an elevated D-dimer. However, his CT is  negative for PE. Acid reflux is on the differential. Musculoskeletal is  definitely on top of the list.   PLAN:  1.  Chest pain. Will observe the patient in the hospital, ruling out for      acute coronary syndrome by serial cardiac enzymes. The patient himself      has some risk factors for cardiac disease including obesity,      hypertension. His cholesterol status is unclear. His pain though is very      atypical for cardiac etiology. The patient, again, is a poor historian.      Based on all of the above, we will observe the patient in the hospital.      He might benefit also from an inpatient stress test. Hence, we will      obtain opinion from [facility]. We will also obtain a 2-D      echocardiogram considering cardiomegaly seen on chest x-ray. We will      check a fasting lipid profile in the morning. We will continue his      hydrochlorothiazide and enalapril at this time and put him on a cardiac      diet. Give him baby aspirin. However, will withhold metoprolol and     anticoagulation unless he rules in or if his blood pressure  is elevated.  2.  Hypertension. As above.  3.  Gout. Will continue his allopurinol and give him analgesics as needed.  4.  Deep venous thrombosis and gastrointestinal prophylaxis will be  provided.   Other management decisions will be based on results of initial testing and  patient's response to treatment.      Bonnielee Haff, MD  Electronically Signed     GK/MEDQ  D:  01/14/2005  T:  01/14/2005  Job:  [id number]

## 2010-06-21 NOTE — Discharge Summary (Signed)
NAME:  ZEF, BRUSSO            ACCOUNT NO.:  [id number]   MEDICAL RECORD NO.:  [id number]          PATIENT TYPE:  OBV   LOCATION:  A224                          FACILITY:  [facility]   PHYSICIAN:  Bonnielee Haff, MD     DATE OF BIRTH:  11/14/1965   DATE OF ADMISSION:  01/14/2005  DATE OF DISCHARGE:  12/13/2006LH                                 DISCHARGE SUMMARY   PRIMARY CARE PHYSICIAN:  [facility] Department.   DISCHARGE DIAGNOSES:  1.  Chest pain, likely musculoskeletal etiology.  2.  Negative stress test.  3.  Hypertension.  4.  Morbid obesity.  5.  Gout.  6.  Chronic renal insufficiency.   HISTORY OF PRESENT ILLNESS:  Please see H&P dictated at the time of  admission for details regarding patient's presenting illness.   BRIEF HOSPITAL COURSE:  Briefly, this is a 45 year old African-American male  with the above-mentioned medical problems who presented to the ED with a one-  week history of chest pain and back pain.  The patient was a very poor  historian.  It appeared that this pain was most likely musculoskeletal.  However, because he was a poor historian and because he had positive risk  factors, we observed the patient in the hospital and requested [facility] to consider inpatient stress test.  The patient ruled out for  acute coronary syndrome.  He underwent a treadmill stress test the following  day which was conveyed to me as being negative.  The patient also had an  echocardiogram which also did not show any significant abnormalities.  His  EF was 55-60%.  Mild left ventricular hypertrophy was noted.  No wall motion  abnormalities were seen.  Based on the above, it was determined that the  patient is stable for discharge.  During this admission, it was found that  the patient had elevated creatinine.  This was not a new finding for this  patient.  I have arranged an outpatient referral to our nephrologist  Alison Murray, M.D.  We have  discontinued the patient's  hydrochlorothiazide and have initiated Norvasc for his blood pressure  control.   Also as part of his initial workup, the patient underwent CT chest ordered  by ED physician, to rule out PE, which was ruled out.  This was in response  to an elevated D-dimer.   DISCHARGE MEDICATIONS:  1.  Norvasc 5 mg p.o. daily.  2.  Aspirin 81 mg p.o. daily.  3.  Otherwise discontinue hydrochlorothiazide.  4.  The patient will avoid colchicine and indomethacin, considering his      renal insufficiency.  5.  Continue enalapril, allopurinol and hydrocodone on a p.r.n. basis.   FOLLOWUP:  1.  Dr. Lowanda Foster for renal insufficiency in the next few weeks.  2.  Dr. Asencion Noble, unassigned physician for 01/14/2005, as primary medical      doctor, in the next few weeks as well.   DISCHARGE ACTIVITIES:  Physical activity: No restrictions.   DIET:  Heart healthy diet.   IMAGING STUDIES:  1.  Chest x-ray which showed borderline cardiomegaly, no other  acute      abnormalities.  2.  CT chest as discussed above.   CONSULTATIONS:  [facility] for inpatient stress test.      Bonnielee Haff, MD  Electronically Signed     GK/MEDQ  D:  01/16/2005  T:  01/16/2005  Job:  [id number]   cc:   Paula Compton. Willey Blade, MD  Fax: [phone]   Jacqulyn Ducking, M.D. [facility]  [address]  [address]  [place]  [place] [address]   Alison Murray, M.D.  Fax: [phone]

## 2010-06-25 ENCOUNTER — Ambulatory Visit (HOSPITAL_COMMUNITY): Payer: BC Managed Care – PPO

## 2010-06-27 ENCOUNTER — Ambulatory Visit (HOSPITAL_COMMUNITY): Payer: BC Managed Care – PPO

## 2010-07-02 ENCOUNTER — Ambulatory Visit (HOSPITAL_COMMUNITY): Payer: BC Managed Care – PPO | Admitting: Physical Therapy

## 2010-07-04 ENCOUNTER — Ambulatory Visit (HOSPITAL_COMMUNITY): Payer: BC Managed Care – PPO | Admitting: Physical Therapy

## 2010-08-21 ENCOUNTER — Encounter (HOSPITAL_COMMUNITY): Payer: Self-pay | Admitting: Emergency Medicine

## 2010-08-21 ENCOUNTER — Other Ambulatory Visit: Payer: Self-pay

## 2010-08-21 ENCOUNTER — Emergency Department (HOSPITAL_COMMUNITY)
Admission: EM | Admit: 2010-08-21 | Discharge: 2010-08-21 | Disposition: A | Discharge status: Home / Self Care | Payer: Self-pay | Attending: Emergency Medicine | Admitting: Emergency Medicine

## 2010-08-21 ENCOUNTER — Emergency Department (HOSPITAL_COMMUNITY): Payer: Self-pay

## 2010-08-21 DIAGNOSIS — Z79899 Other long term (current) drug therapy: Secondary | ICD-10-CM | POA: Insufficient documentation

## 2010-08-21 DIAGNOSIS — I10 Essential (primary) hypertension: Secondary | ICD-10-CM | POA: Insufficient documentation

## 2010-08-21 DIAGNOSIS — R42 Dizziness and giddiness: Secondary | ICD-10-CM | POA: Insufficient documentation

## 2010-08-21 DIAGNOSIS — R079 Chest pain, unspecified: Secondary | ICD-10-CM | POA: Insufficient documentation

## 2010-08-21 DIAGNOSIS — M109 Gout, unspecified: Secondary | ICD-10-CM | POA: Insufficient documentation

## 2010-08-21 HISTORY — DX: Other chronic pain: G89.29

## 2010-08-21 HISTORY — DX: Dorsalgia, unspecified: M54.9

## 2010-08-21 LAB — CARDIAC PANEL(CRET KIN+CKTOT+MB+TROPI)
CK, MB: 4.4 ng/mL — ABNORMAL HIGH (ref 0.3–4.0)
Relative Index: INVALID (ref 0.0–2.5)
Total CK: 95 U/L (ref 7–232)
Troponin I: <0.3 ng/mL (ref <0.30)
Troponin I: <0.3 ng/mL (ref <0.30)

## 2010-08-21 LAB — URINALYSIS, ROUTINE W REFLEX MICROSCOPIC
Bilirubin Urine: NEGATIVE
Ketones, ur: NEGATIVE mg/dL
Nitrite: NEGATIVE
pH: 6 (ref 5.0–8.0)

## 2010-08-21 LAB — CBC
MCV: 87.1 fL (ref 78.0–100.0)
Platelets: 117 10*3/uL — ABNORMAL LOW (ref 150–400)
RBC: 4.73 MIL/uL (ref 4.22–5.81)
RDW: 18.6 % — ABNORMAL HIGH (ref 11.5–15.5)
WBC: 14.5 10*3/uL — ABNORMAL HIGH (ref 4.0–10.5)

## 2010-08-21 LAB — COMPREHENSIVE METABOLIC PANEL
ALT: 19 U/L (ref 0–53)
AST: 18 U/L (ref 0–37)
Albumin: 3.1 g/dL — ABNORMAL LOW (ref 3.5–5.2)
CO2: 25 mEq/L (ref 19–32)
Chloride: 107 mEq/L (ref 96–112)
Creatinine, Ser: 2.55 mg/dL — ABNORMAL HIGH (ref 0.50–1.35)
GFR calc Af Amer: 34 mL/min — ABNORMAL LOW (ref >60)
GFR calc non Af Amer: 28 mL/min — ABNORMAL LOW (ref >60)
Potassium: 4.7 mEq/L (ref 3.5–5.1)
Sodium: 140 mEq/L (ref 135–145)
Total Bilirubin: 0.3 mg/dL (ref 0.3–1.2)

## 2010-08-21 LAB — URINE MICROSCOPIC-ADD ON

## 2010-08-21 LAB — GLUCOSE, CAPILLARY: Glucose-Capillary: 86 mg/dL (ref 70–99)

## 2010-08-21 MED ORDER — ASPIRIN 81 MG PO CHEW
324.0000 mg | CHEWABLE_TABLET | Freq: Once | ORAL | Status: AC
  Administered 2010-08-21: 324 mg via ORAL
  Filled 2010-08-21 (×2): qty 4

## 2010-08-21 MED ORDER — SODIUM CHLORIDE 0.9 % IV SOLN
INTRAVENOUS | Status: DC
  Administered 2010-08-21: 30 mL via INTRAVENOUS

## 2010-08-21 NOTE — ED Notes (Signed)
Patient resting quietly in bed watching TV. Airway patent. Respirations even and nonlabored. No acute distress noted. No verbalized requests given.

## 2010-08-21 NOTE — ED Provider Notes (Signed)
History     Chief Complaint  Patient presents with  . Chest Pain  . Dizziness   Patient is a 45 y.o. male presenting with chest pain. The history is provided by the patient. No language interpreter was used.  Chest Pain Episode onset: 2 days ago. Episode Length: "few seconds" Chest pain occurs intermittently. The chest pain is unchanged. Associated with: nothing. The severity of the pain is mild. The quality of the pain is described as burning. The pain does not radiate. Exacerbated by: nothing. Primary symptoms include dizziness. Pertinent negatives for primary symptoms include no fever, no fatigue, no shortness of breath, no cough, no abdominal pain, no nausea and no vomiting.  Dizziness does not occur with nausea, vomiting or diaphoresis.   Pertinent negatives for associated symptoms include no diaphoresis and no numbness. Associated symptoms comments: Headache. . He tried nothing for the symptoms. Risk factors include obesity and male gender.  His past medical history is significant for CHF, diabetes, hypertension and sickle cell disease.  Pertinent negatives for past medical history include no seizures. Past medical history comments: multiple myeloma   C/o mid-sternal intermittent chest pain onset 2 days ago and persistent since. States chest pain lasts for a few seconds and then resolves. Describes chest pain as burning. Also c/o dizziness and HA onset this AM and persistent since. Patient with separate c/o chronic back pain. Denies n/v, SOB.  Past Medical History  Diagnosis Date  . Hypertension   . Renal insufficiency   . Diabetes mellitus   . Gout   . Chronic back pain     Past Surgical History  Procedure Date  . None     Family History  Problem Relation Age of Onset  . Diabetes Mother   . Hypertension Mother   . Heart failure Mother   . Hyperlipidemia Mother   . Cancer Father   . Diabetes Father   . Hypertension Father   . Hyperlipidemia Father     History    Substance Use Topics  . Smoking status: Never Smoker   . Smokeless tobacco: Never Used  . Alcohol Use: No      Review of Systems  Constitutional: Negative for fever, diaphoresis and fatigue.  HENT: Negative for congestion, sinus pressure and ear discharge.   Eyes: Negative for discharge.  Respiratory: Negative for cough and shortness of breath.   Cardiovascular: Positive for chest pain.  Gastrointestinal: Negative for nausea, vomiting, abdominal pain and diarrhea.  Genitourinary: Negative for frequency and hematuria.  Musculoskeletal: Positive for back pain.  Skin: Negative for rash.  Neurological: Positive for dizziness and headaches. Negative for seizures and numbness.  Hematological: Negative.   Psychiatric/Behavioral: Negative for hallucinations.    Physical Exam  BP 164/105  Pulse 70  Temp(Src) 98.7 F (37.1 C) (Oral)  Resp 19  Ht 5\' 11"  (1.803 m)  Wt 299 lb (135.626 kg)  BMI 41.70 kg/m2  SpO2 95%  Physical Exam  Nursing note and vitals reviewed. Constitutional: He is oriented to person, place, and time. He appears well-developed. No distress.       Hypertensive  HENT:  Head: Normocephalic and atraumatic.  Mouth/Throat: Uvula is midline, oropharynx is clear and moist and mucous membranes are normal.  Eyes: EOM are normal. No scleral icterus.       Fat deposits upon sclera bilaterally  Neck: Normal range of motion. Neck supple. No thyromegaly present.  Cardiovascular: Normal rate, regular rhythm and intact distal pulses.  Exam reveals no gallop and  no friction rub.   No murmur heard. Pulmonary/Chest: No stridor. He has no wheezes. He has no rales. He exhibits no tenderness.  Abdominal: Soft. He exhibits no distension. There is no tenderness. There is no rebound.       Obese  Musculoskeletal: Normal range of motion.  Lymphadenopathy:    He has no cervical adenopathy.  Neurological: He is alert and oriented to person, place, and time. Coordination normal.   Skin: No rash noted. No erythema.  Psychiatric: He has a normal mood and affect. His behavior is normal.    ED Course  Procedures 1:52 PM Patient reports pain has improved at this time and he is feeling much better. Lab results reviewed with patient. MDM  Results discussed with pt.  Also spoke with dr. Lattie Haw and he agreed with d/c home  Results for orders placed during the hospital encounter of 08/21/10  CBC      Component Value Range   WBC 14.5 (*) 4.0 - 10.5 (K/uL)   RBC 4.73  4.22 - 5.81 (MIL/uL)   Hemoglobin 13.0  13.0 - 17.0 (g/dL)   HCT 41.2  39.0 - 52.0 (%)   MCV 87.1  78.0 - 100.0 (fL)   MCH 27.5  26.0 - 34.0 (pg)   MCHC 31.6  30.0 - 36.0 (g/dL)   RDW 18.6 (*) 11.5 - 15.5 (%)   Platelets 117 (*) 150 - 400 (K/uL)  COMPREHENSIVE METABOLIC PANEL      Component Value Range   Sodium 140  135 - 145 (mEq/L)   Potassium 4.7  3.5 - 5.1 (mEq/L)   Chloride 107  96 - 112 (mEq/L)   CO2 25  19 - 32 (mEq/L)   Glucose, Bld 93  70 - 99 (mg/dL)   BUN 58 (*) 6 - 23 (mg/dL)   Creatinine, Ser 2.55 (*) 0.50 - 1.35 (mg/dL)   Calcium 8.3 (*) 8.4 - 10.5 (mg/dL)   Total Protein 6.3  6.0 - 8.3 (g/dL)   Albumin 3.1 (*) 3.5 - 5.2 (g/dL)   AST 18  0 - 37 (U/L)   ALT 19  0 - 53 (U/L)   Alkaline Phosphatase 80  39 - 117 (U/L)   Total Bilirubin 0.3  0.3 - 1.2 (mg/dL)   GFR calc non Af Amer 28 (*) >60 (mL/min)   GFR calc Af Amer 34 (*) >60 (mL/min)  CARDIAC PANEL(CRET KIN+CKTOT+MB+TROPI)      Component Value Range   Total CK 95  7 - 232 (U/L)   CK, MB 4.8 (*) 0.3 - 4.0 (ng/mL)   Troponin I <0.30  <0.30 (ng/mL)   Relative Index RELATIVE INDEX IS INVALID  0.0 - 2.5   URINALYSIS, ROUTINE W REFLEX MICROSCOPIC      Component Value Range   Color, Urine YELLOW  YELLOW    Appearance CLEAR  CLEAR    Specific Gravity, Urine 1.025  1.005 - 1.030    pH 6.0  5.0 - 8.0    Glucose, UA NEGATIVE  NEGATIVE (mg/dL)   Hgb urine dipstick TRACE (*) NEGATIVE    Bilirubin Urine NEGATIVE  NEGATIVE     Ketones, ur NEGATIVE  NEGATIVE (mg/dL)   Protein, ur 100 (*) NEGATIVE (mg/dL)   Urobilinogen, UA 0.2  0.0 - 1.0 (mg/dL)   Nitrite NEGATIVE  NEGATIVE    Leukocytes, UA NEGATIVE  NEGATIVE   GLUCOSE, CAPILLARY      Component Value Range   Glucose-Capillary 86  70 - 99 (mg/dL)  URINE MICROSCOPIC-ADD  ON      Component Value Range   RBC / HPF 0-2  <3 (RBC/hpf)     DG Chest Portable 1 View  08/21/2010  *RADIOLOGY REPORT*  Clinical Data: Chest pain  PORTABLE CHEST - 1 VIEW  Comparison: Portable exam 0911 hours compared to 05/07/2010  Findings: Upper normal heart size. Normal mediastinal contours and pulmonary vascularity for technique. Lungs clear. No pleural effusion or pneumothorax. Bones unremarkable.  IMPRESSION: No acute abnormalities.  Original Report Authenticated By: Burnetta Sabin, M.D.    Date: 08/21/2010  Rate: 63  Rhythm: normal sinus rhythm  QRS Axis: normal  Intervals: Left Ventricular Hypertrophy  ST/T Wave abnormalities: normal  Conduction Disutrbances:none  Narrative Interpretation:   Old EKG Reviewed: none available        Chart written by Carolyne Littles acting as scribe for Dr. Roderic Palau  I personally performed the services described in this documentation, which was scribed in my presence. The recorded information has been reviewed and considered.   Maudry Diego, MD 08/21/10 [phone]

## 2010-08-21 NOTE — ED Notes (Signed)
Pt resting in bed watching TV. Ate well at lunch. Drinking fluids. VSS. No acute distress noted. Airway patent.

## 2010-08-21 NOTE — ED Notes (Signed)
Patient resting quietly in bed watching TV and eating lunch tray.  Airway patent. Respirations even and nonlabored. No acute distress noted. No verbalized requests given.

## 2010-08-21 NOTE — ED Notes (Signed)
Patient wants some graham crackers and RN [employer].

## 2010-08-21 NOTE — ED Notes (Signed)
Patient is wondering how much longer till discharge. Notified RN [employer]

## 2010-08-21 NOTE — ED Notes (Signed)
Pt resting and watching TV. No acute distress noted. Airway patent. Pt reports pain 4-5/10 in abdomen.

## 2010-08-21 NOTE — ED Notes (Signed)
Patient resting quietly in bed. Airway patent. Respirations even and nonlabored. No acute distress noted. No verbalized requests given.

## 2010-08-21 NOTE — ED Notes (Signed)
Patient c/o mid sternal intermittent  chest pain x2 days. Patient states "Feels like heart burn." Patient also reports abd pain and back pain. Denies any nausea or vomiting. Per patient diarrhea yesterday but regular BM this morning. Patient reports shortness of breath only with exertion.  Airway patent. Respirations even and non labored. No acute distress. noted.

## 2010-08-21 NOTE — ED Notes (Signed)
Patient is wanting something to eat and drink. Notified the RN.

## 2010-08-22 IMAGING — CR DG CHEST 1V PORT
1 series · 1 of 1 positions shown · non-contrast
Comparison: 06/23/2008

CLINICAL DATA: Pain and swelling both feet for 2 weeks

PORTABLE CHEST - 1 VIEW

[view not recorded]
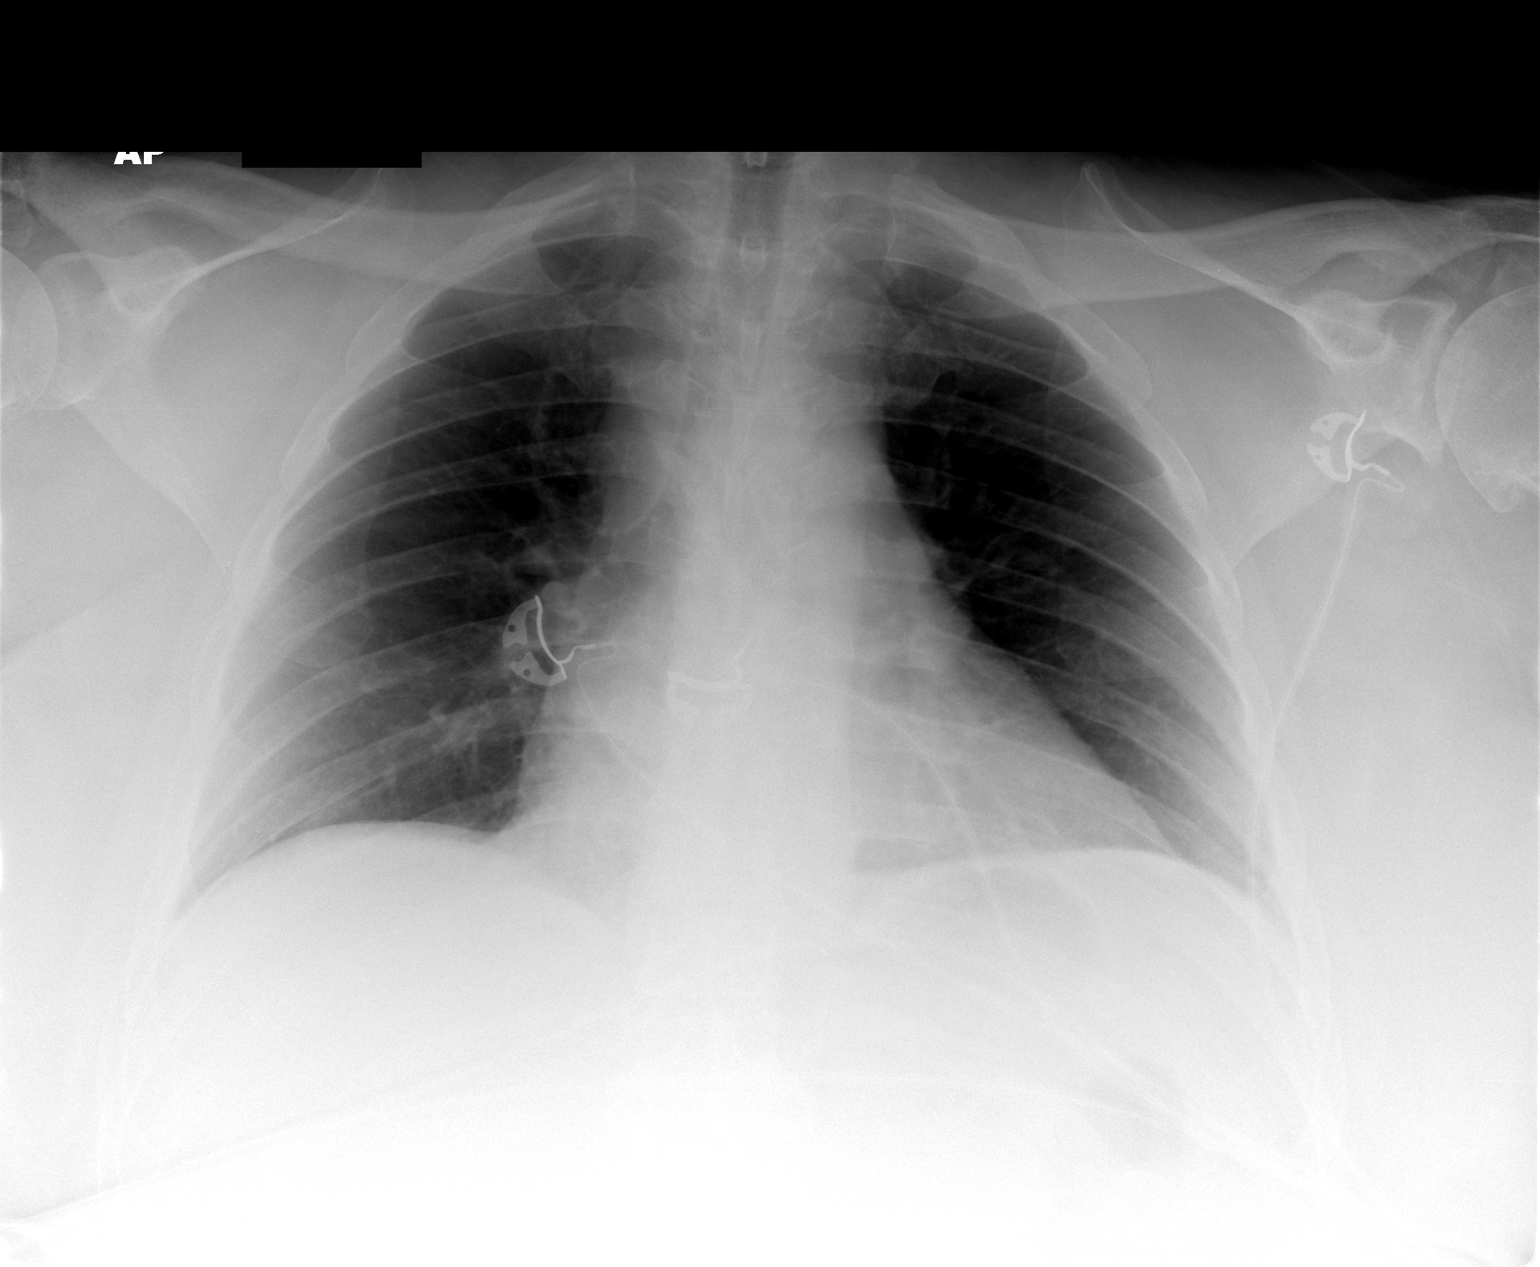

[1 of 1 positions shown; findings below may reference images not displayed]

FINDINGS: Heart and mediastinal contours and pulmonary vascularity
are within normal limits for portable technique.  The lungs are
clear.  No effusion is evident.  Degenerative changes of the left
shoulder.
IMPRESSION: No acute findings.

## 2010-08-23 IMAGING — US US EXTREM LOW VENOUS BILAT
1 series · 13 of 24 positions shown · non-contrast
Comparison: None

CLINICAL DATA: Acute renal failure, sepsis, history hypertension,
diabetes, obesity, bilateral lower extremity edema, question deep
venous thrombosis

VENOUS DUPLEX ULTRASOUND OF BILATERAL LOWER EXTREMITIES
TECHNIQUE: Gray-scale sonography with graded compression, as well
as color Doppler and duplex ultrasound, were performed to evaluate
the deep venous system of both lower extremities from the level of
the common femoral vein through the popliteal and proximal calf
veins.  Spectral Doppler was utilized to evaluate flow at rest and
with distal augmentation maneuvers.

[Series 1: unknown · 0.27mm/px · 13 of 47 slices shown]
[im 1/47]
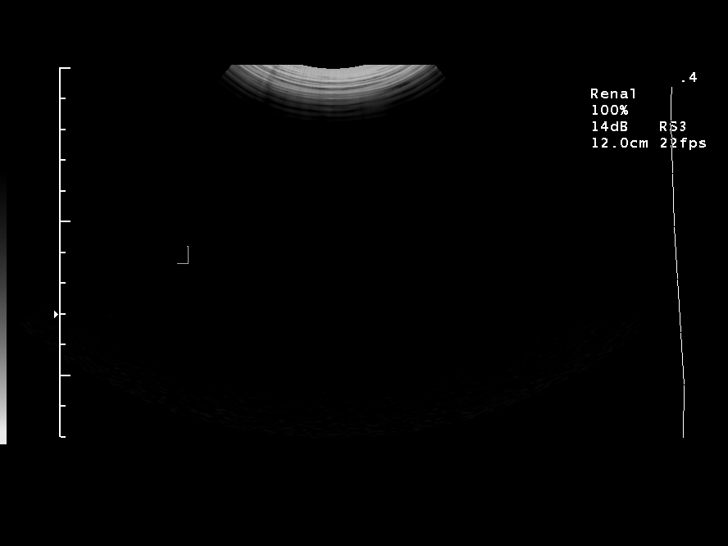
[im 5/47]
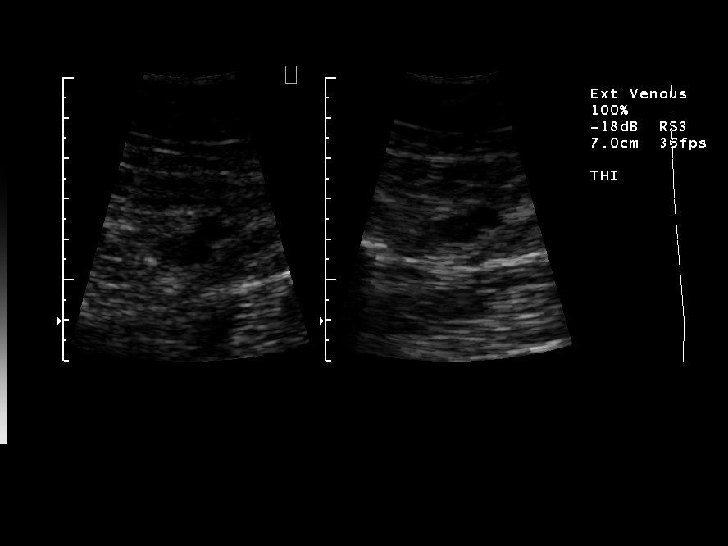
[im 9/47]
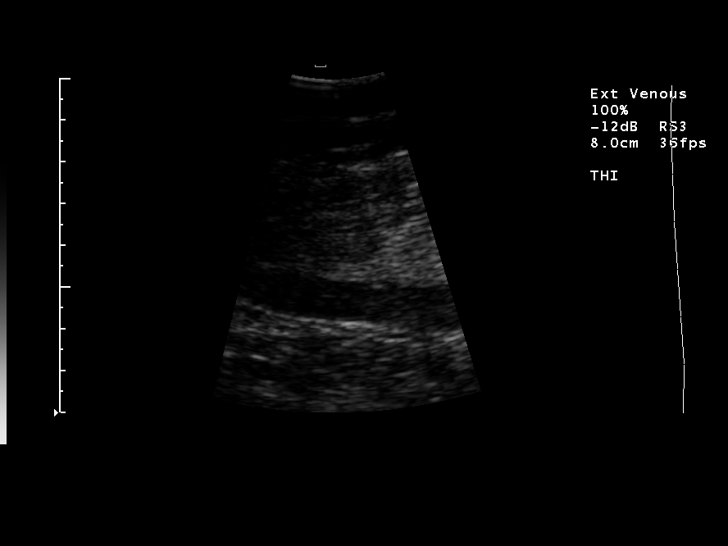
[im 13/47]
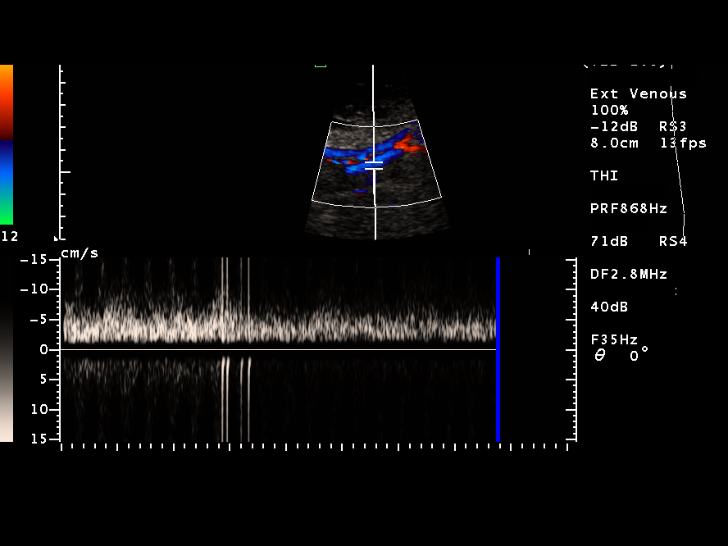
[im 17/47]
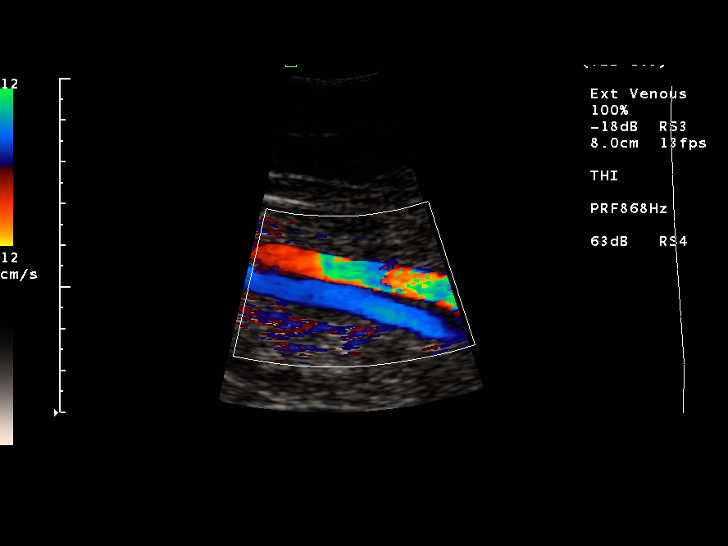
[im 21/47]
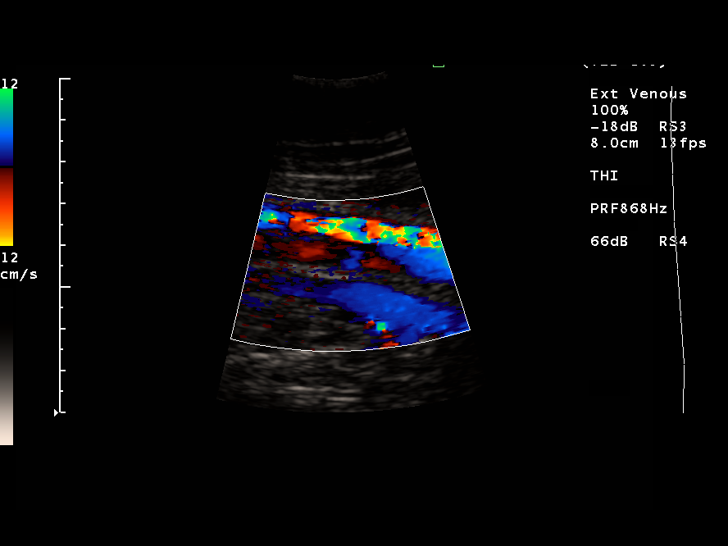
[im 25/47]
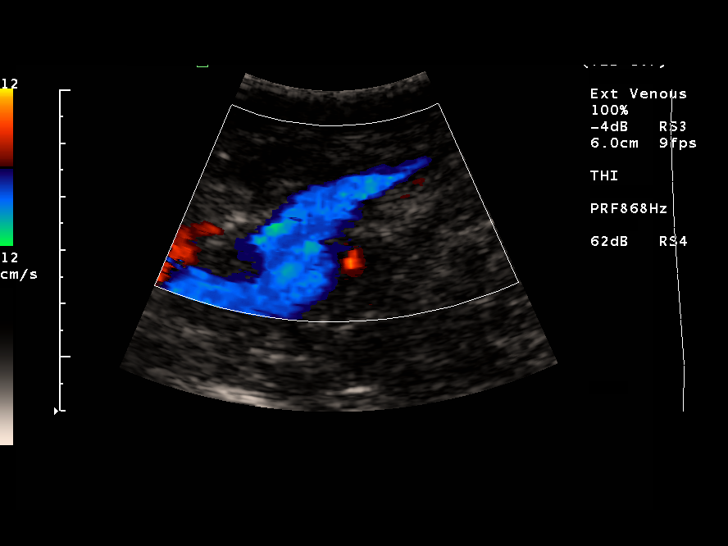
[im 27/47]
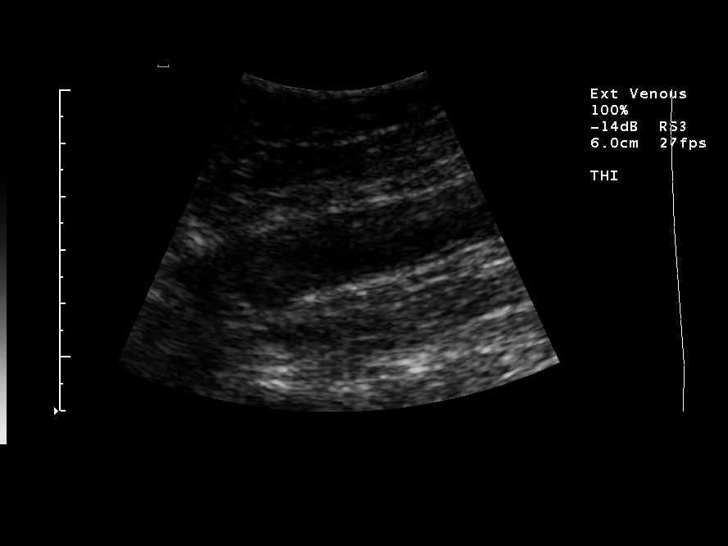
[im 31/47]
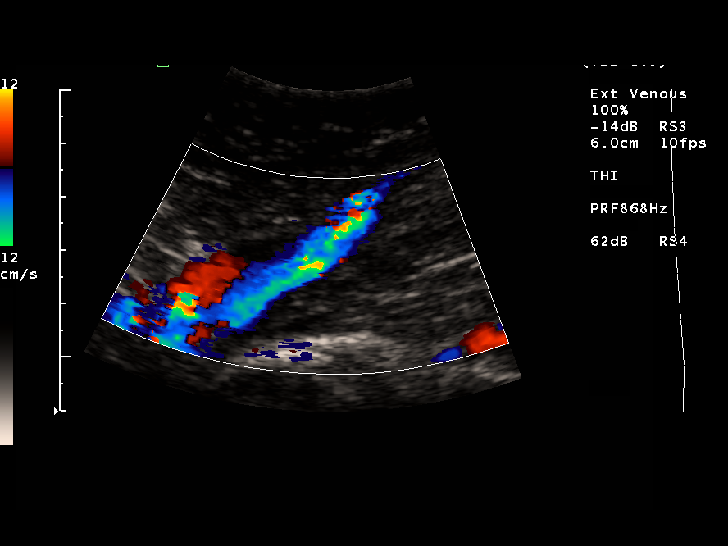
[im 35/47]
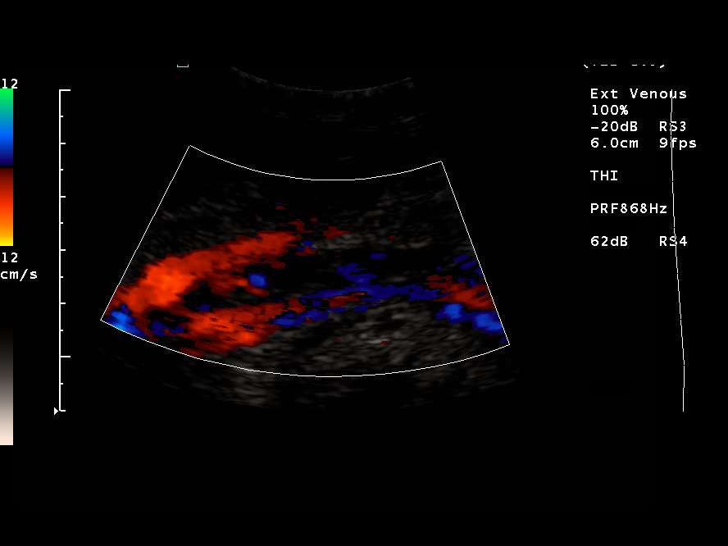
[im 39/47]
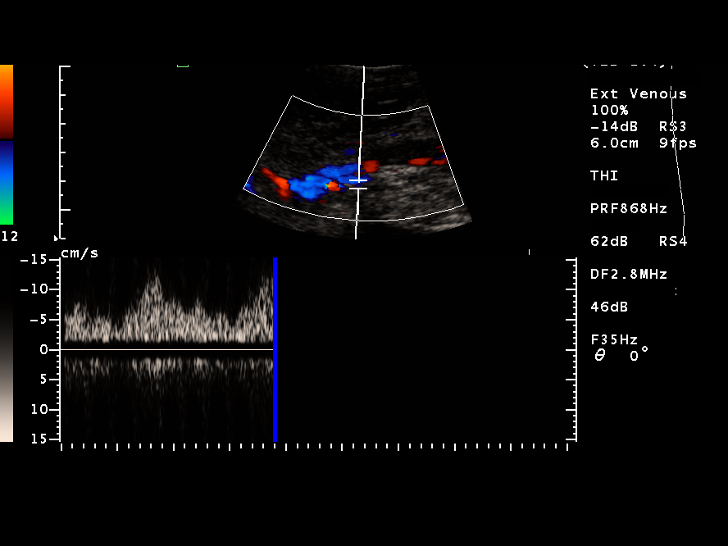
[im 43/47]
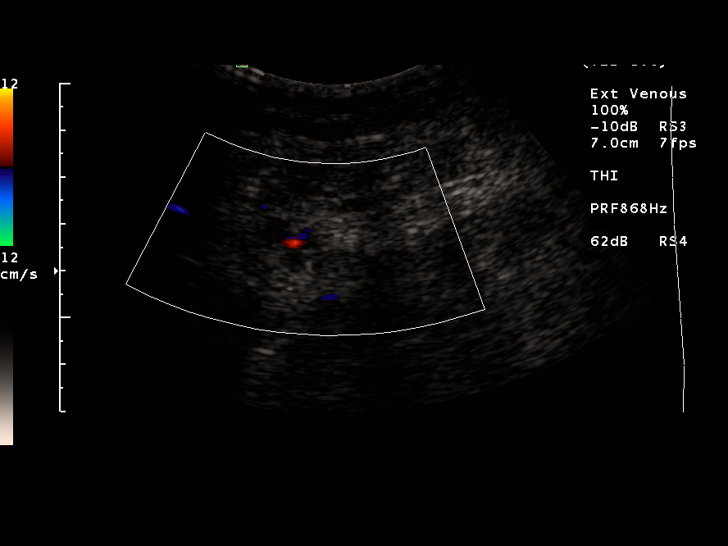
[im 47/47]
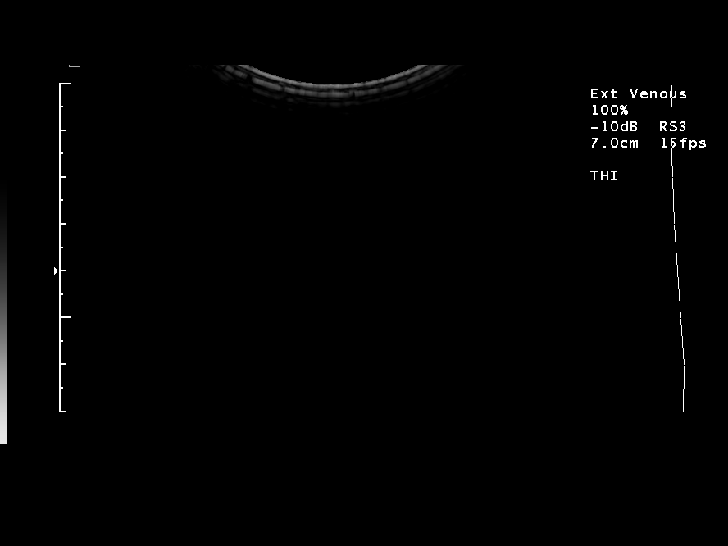

[13 of 24 positions shown; findings below may reference images not displayed]

FINDINGS: Right lower extremity:
Thrombus is identified within the distal femoral vein extending
through popliteal vein to the calf.  Observed clot thrombus is
nonocclusive, and age indeterminate in character.  Spontaneous
venous flow is diminished.  Phasicity in the common femoral and
proximal femoral veins appears normal.  Clot identified within the
right common femoral or proximal femoral veins.  Augmentation
maneuvers not performed.

Left lower extremity:
Deep venous system patent and compressible from left groin through
popliteal fossa.  Spontaneous venous flow present with intact
augmentation and evidence of respiratory phasicity.  No
intraluminal thrombus identified.  Visualized portion of the
greater saphenous system unremarkable.
IMPRESSION: No evidence of deep venous thrombosis in the left lower extremity.
Deep venous thrombosis is present in the right lower extremity at
the distal femoral vein through the popliteal vein into the right
calf, though this is age indeterminate.

Critical test results telephoned to Preetha RN on [address] at the time

## 2010-08-23 IMAGING — US US RENAL
1 series · 14 of 19 positions shown · non-contrast
Comparison: 12/04/2005

CLINICAL DATA: Acute renal failure, possible sepsis

RENAL/URINARY TRACT ULTRASOUND COMPLETE

[Series 1: unknown · 0.30mm/px · 14 of 19 slices shown]
[im 1/19]
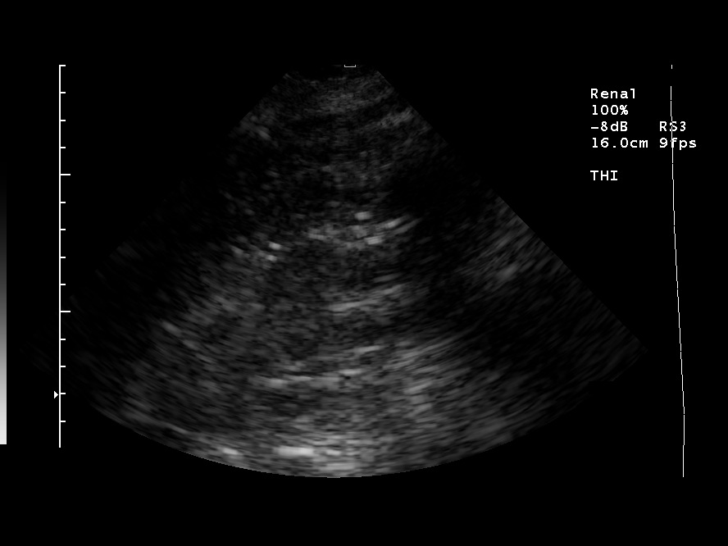
[im 3/19]
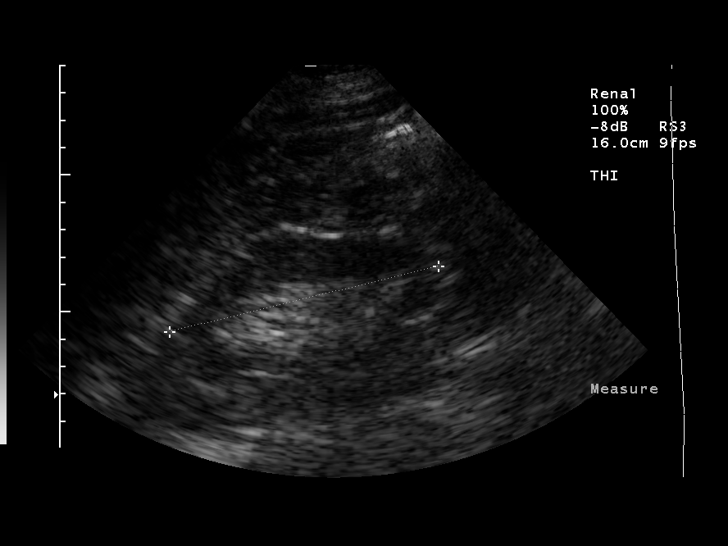
[im 4/19]
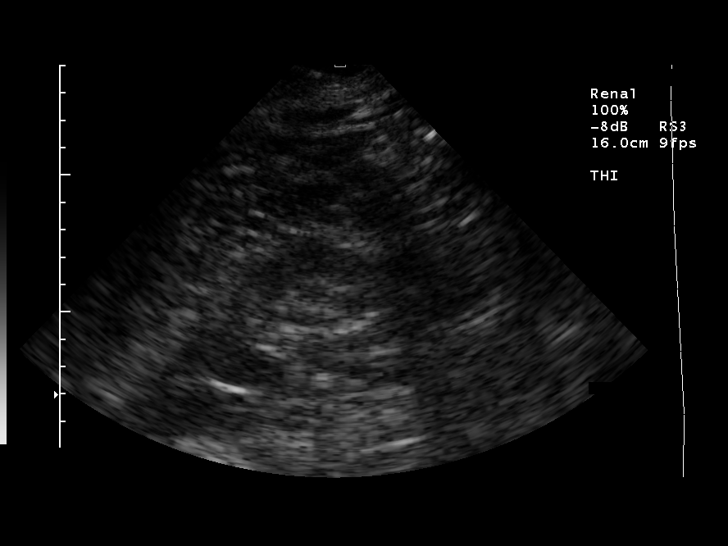
[im 5/19]
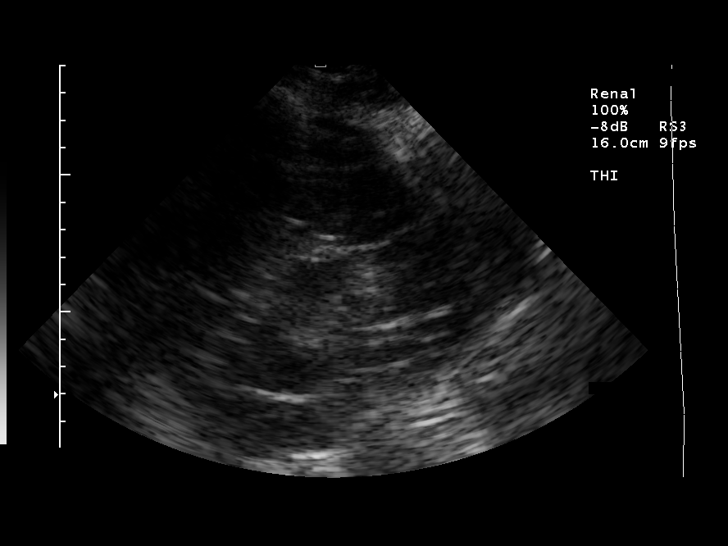
[im 7/19]
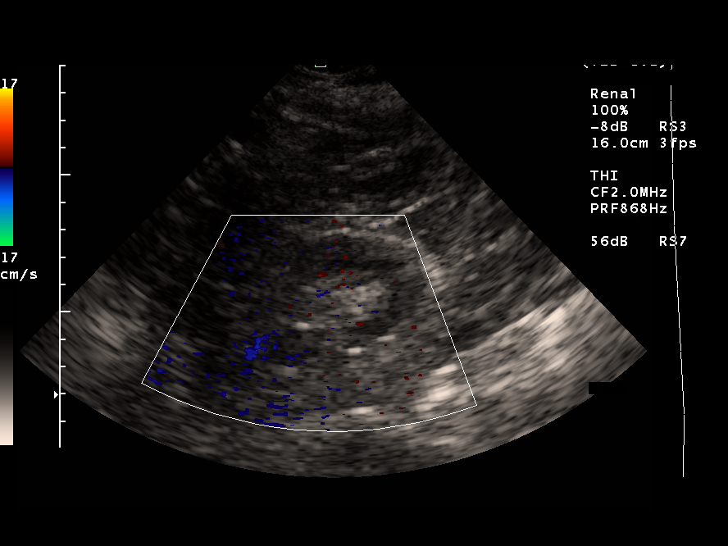
[im 8/19]
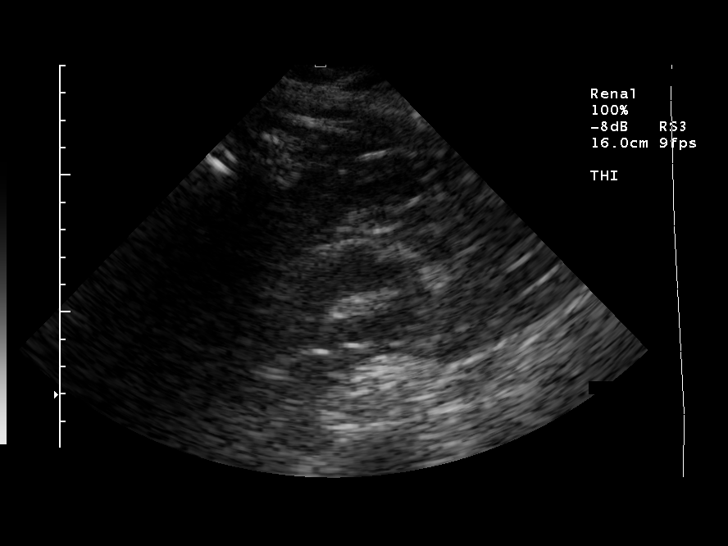
[im 9/19]
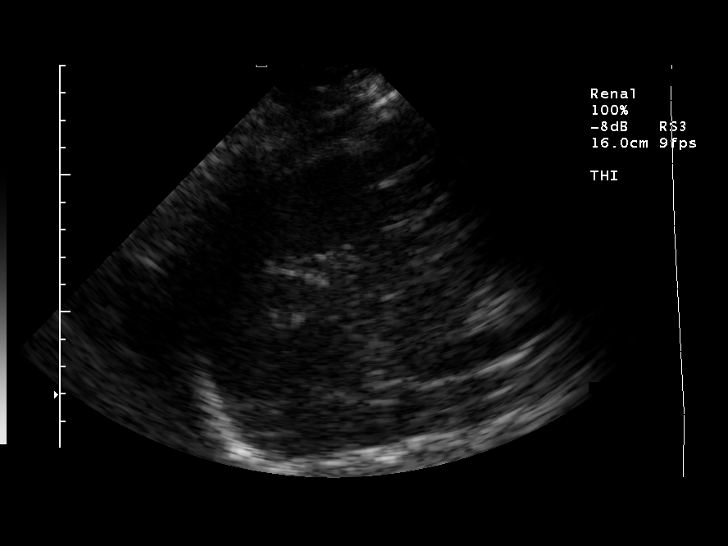
[im 11/19]
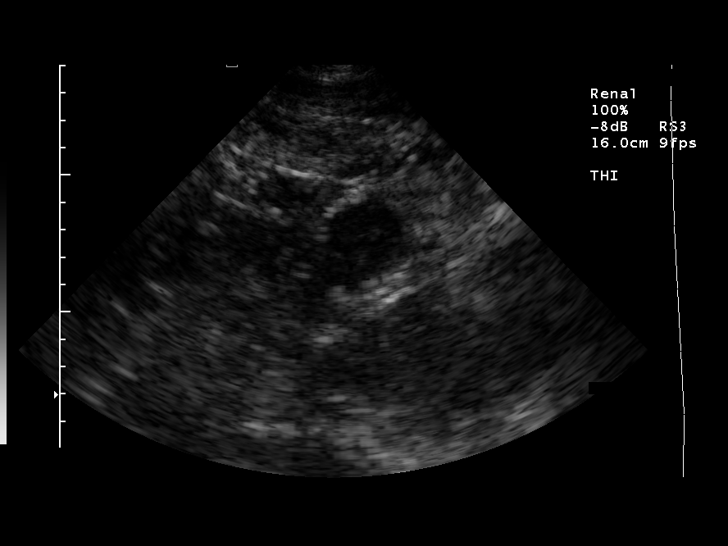
[im 12/19]
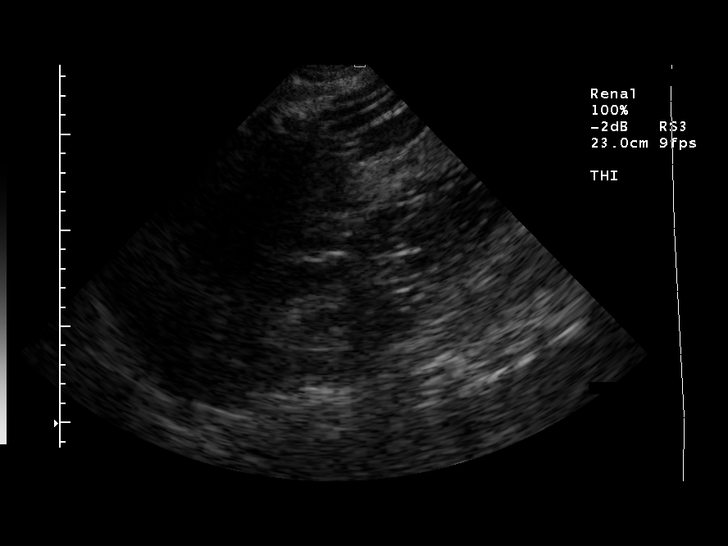
[im 13/19]
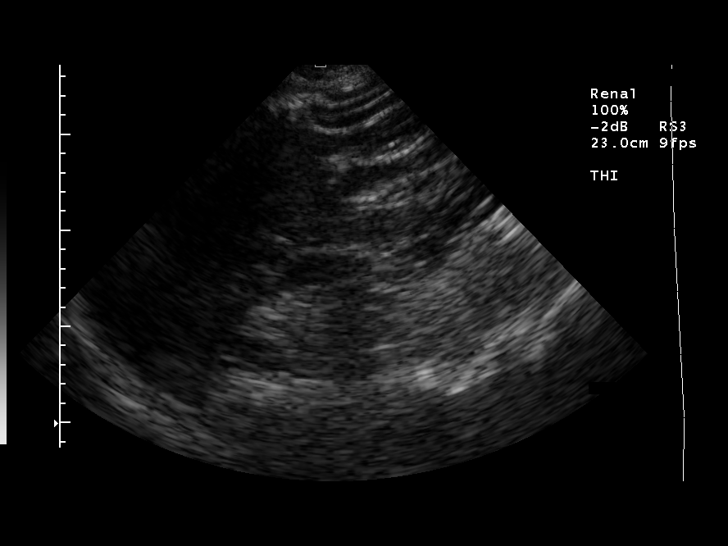
[im 15/19]
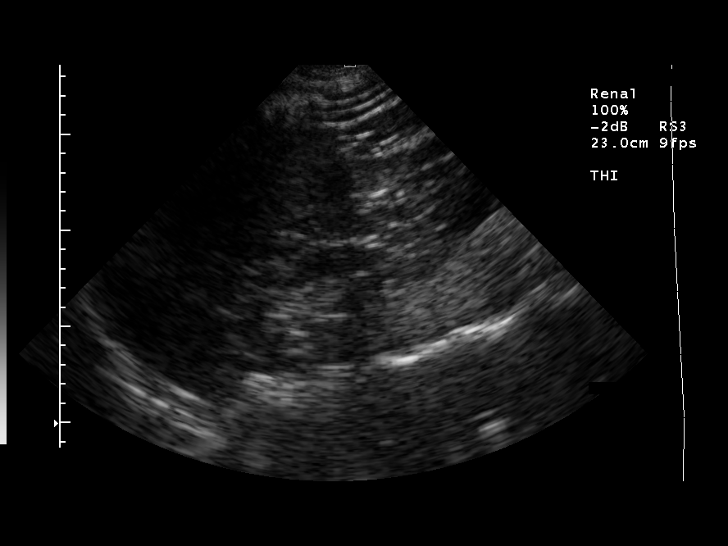
[im 16/19]
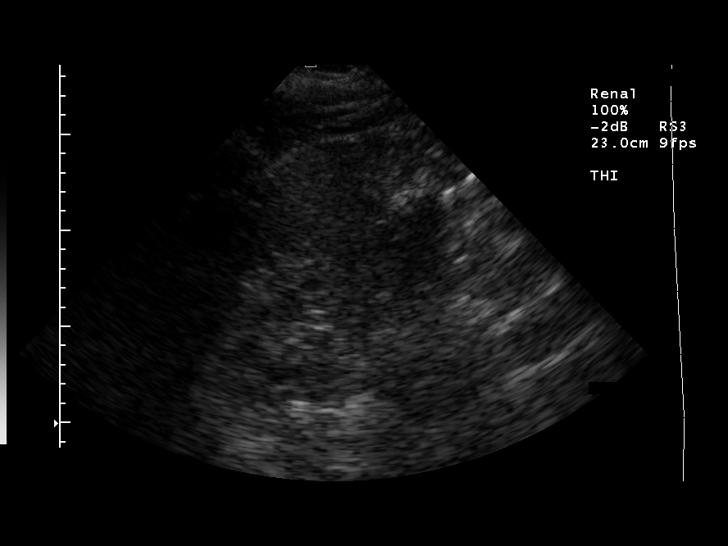
[im 17/19]
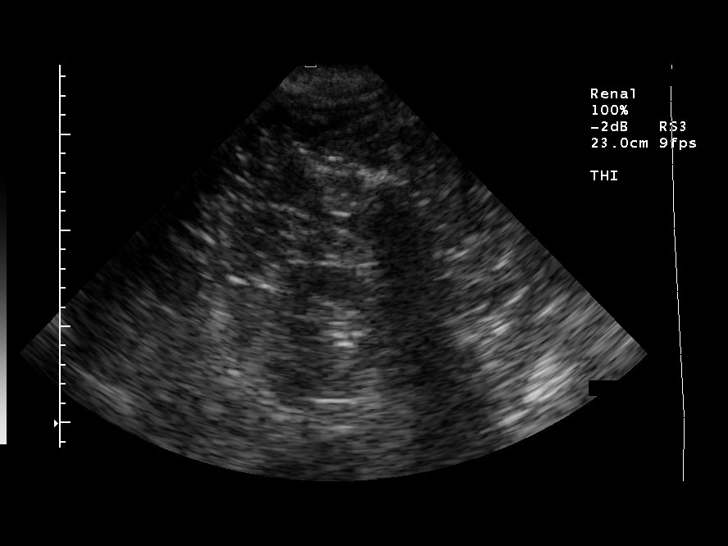
[im 19/19]
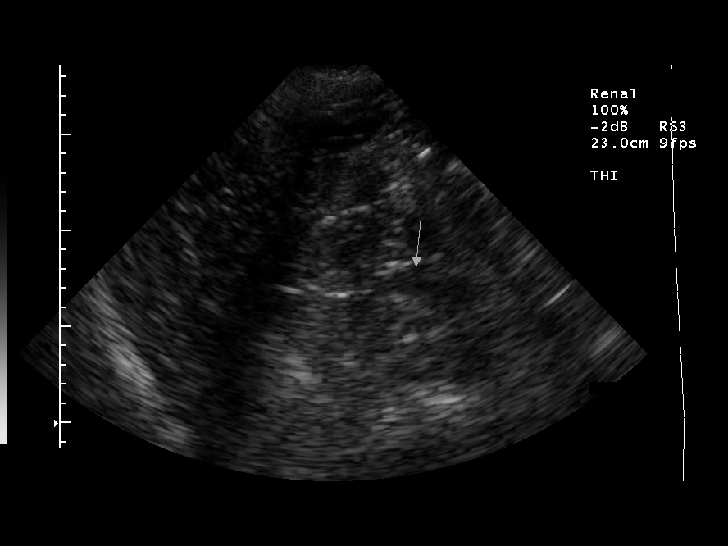

[14 of 19 positions shown; findings below may reference images not displayed]

FINDINGS: Right Kidney:  9.8 cm length.  Minimal cortical thinning.  No gross
mass, hydronephrosis or shadowing calcification.

Left Kidney:  10.1 cm length.  Normal cortical thickness and
echogenicity.  No mass, hydronephrosis or shadowing calcification.

Bladder:  Decompressed by Foley catheter, inadequately evaluated.
IMPRESSION: No acute urinary tract abnormalities identified sonographically.
Minimal cortical thinning right kidney.

## 2010-08-24 IMAGING — NM NM PULM PERFUSION & VENT (REBREATHING & WASHOUT)
2 series · 12 of 12 positions shown · non-contrast
Comparison: Chest radiograph 02/18/2009

CLINICAL DATA: Hypotension, acute renal failure, tachycardia, DVT
right leg

NUCLEAR MEDICINE VENTILATION - PERFUSION LUNG SCAN
TECHNIQUE: Wash-in, equilibrium, and wash-out phase ventilation
images were obtained using 0e-4II gas.  Perfusion images were
obtained in eight projections after intravenous injection of 7c-TTm
MAA.
Radiopharmaceuticals:  23 mCi 0e-4II gas and 5.56 mCi 7c-TTm MAA.

[Series 1: lung vq · 3.20mm/px · 6 of 16 frames shown (1 of 2)]
[frame 2/16  full-range]
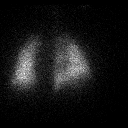
[frame 4/16  full-range]
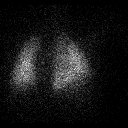
[frame 7/16  full-range]
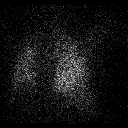
[frame 10/16  full-range]
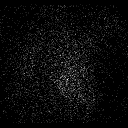
[frame 12/16  full-range]
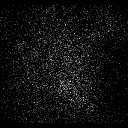
[frame 15/16  full-range]
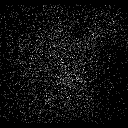

[Series 1: lung vq · 3.20mm/px · 6 of 16 frames shown (2 of 2)]
[frame 2/16]
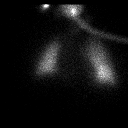
[frame 4/16]
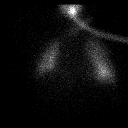
[frame 7/16]
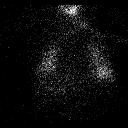
[frame 10/16]
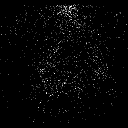
[frame 12/16]
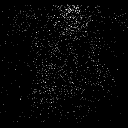
[frame 15/16]
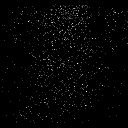

[12 of 12 positions shown; findings below may reference images not displayed]

FINDINGS: Ventilation exam in anterior and posterior projections demonstrates
diminished ventilation at posterior right upper lobe.
Remaining ventilation is normal.
No significant xenon retention.

Perfusion lung scan shows diminished perfusion in posterior right
upper lobe, matching the ventilatory abnormality.
No other perfusion defects are identified.
Chest radiograph is clear.
IMPRESSION: Matching diminished ventilation and perfusion in the posterior
right upper lobe.
Findings represent a low probability pulmonary embolism.

## 2010-09-11 IMAGING — US US RENAL
1 series · 14 of 25 positions shown · non-contrast
Comparison: Urinary tract ultrasound 02/19/2009 and CT abdomen
07/26/2008.

CLINICAL DATA: Acute renal failure.

RENAL/URINARY TRACT ULTRASOUND COMPLETE 03/10/2009:

[Series 1: us renal · 0.39mm/px · 14 of 25 slices shown]
[im 1/25]
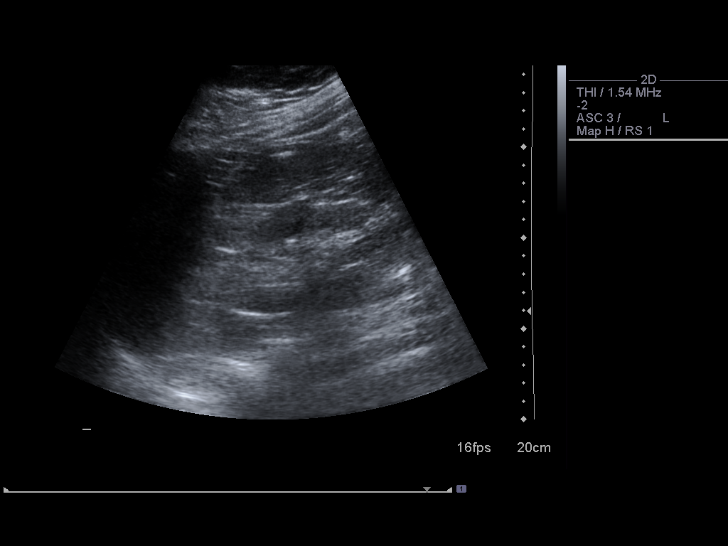
[im 3/25]
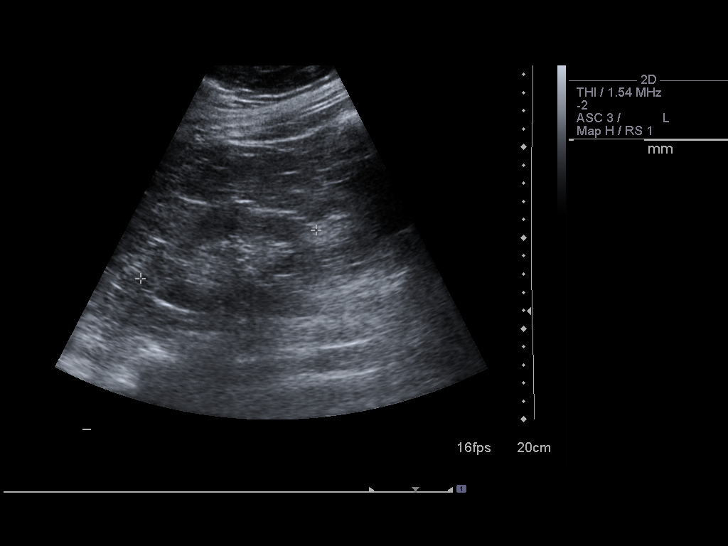
[im 5/25]
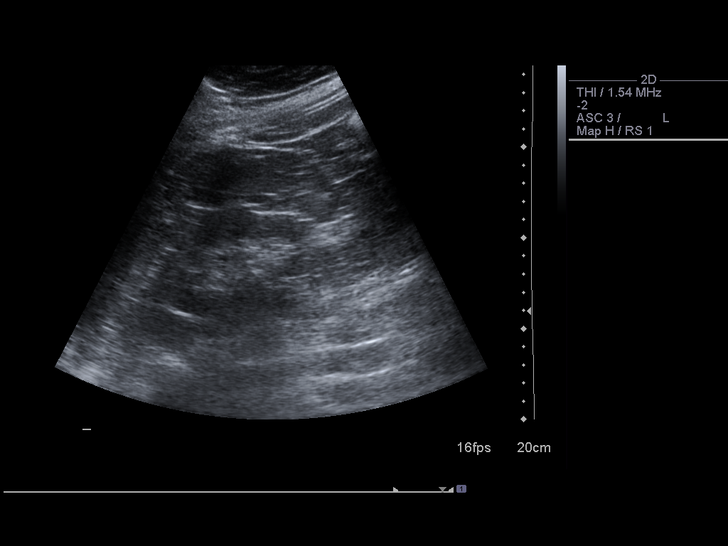
[im 7/25]
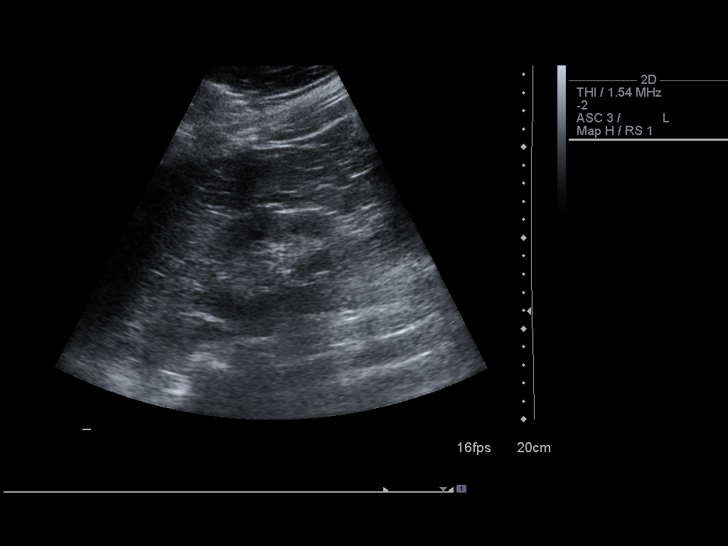
[im 9/25]
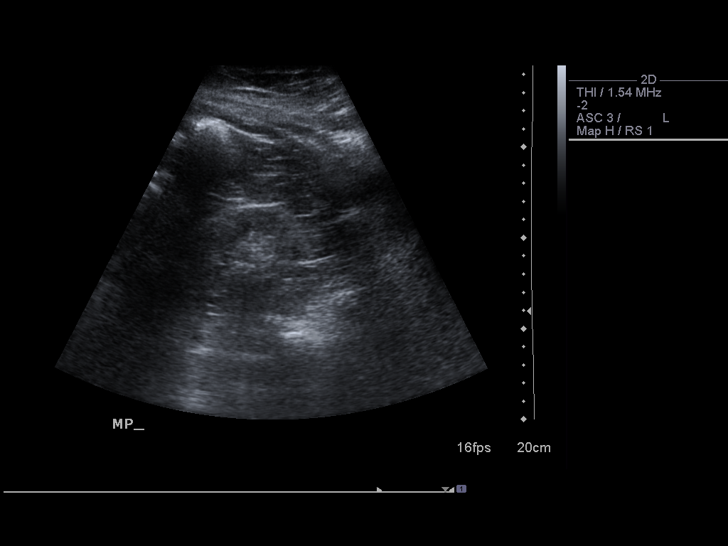
[im 10/25]
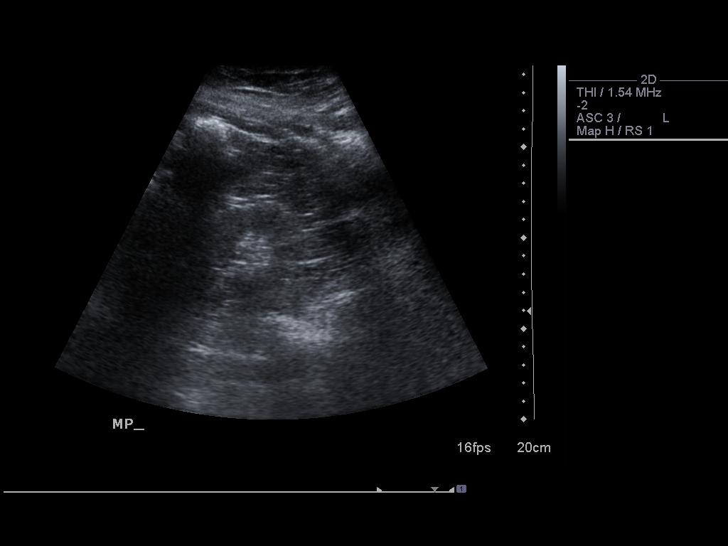
[im 12/25]
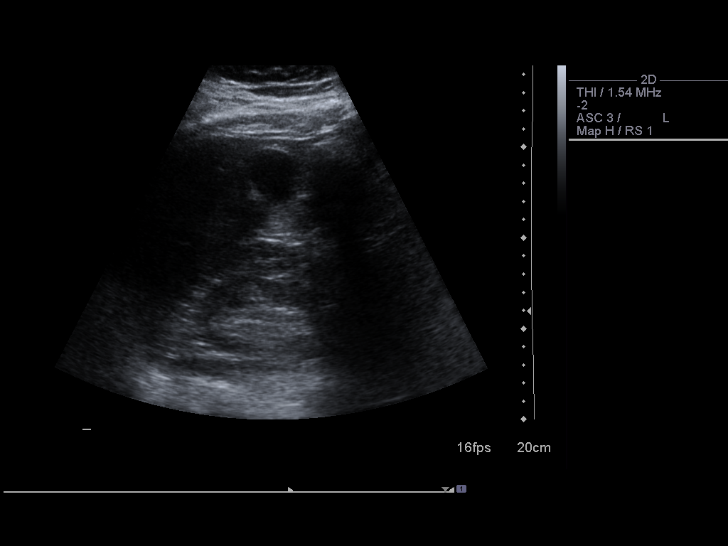
[im 14/25]
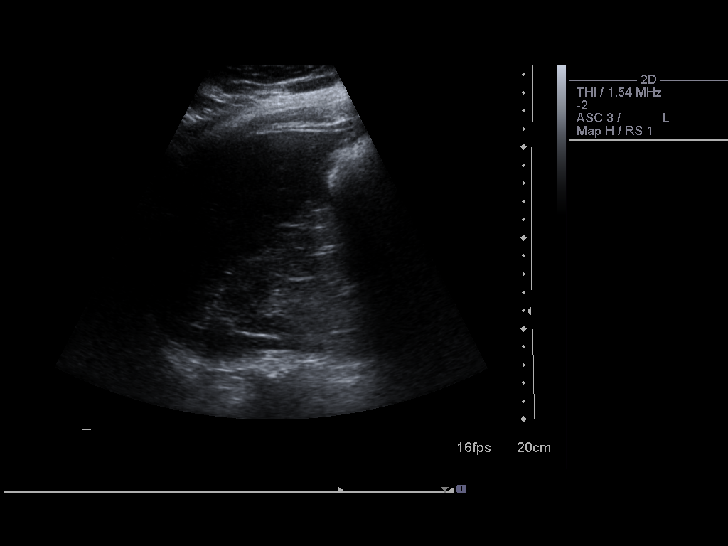
[im 16/25]
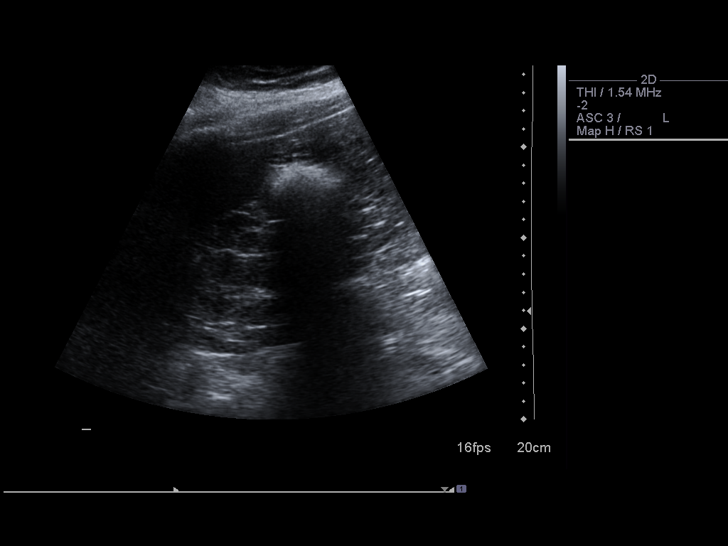
[im 17/25]
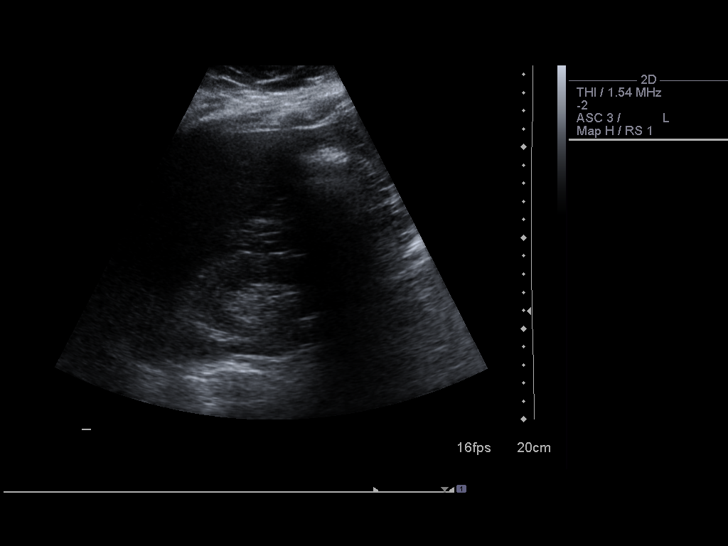
[im 19/25]
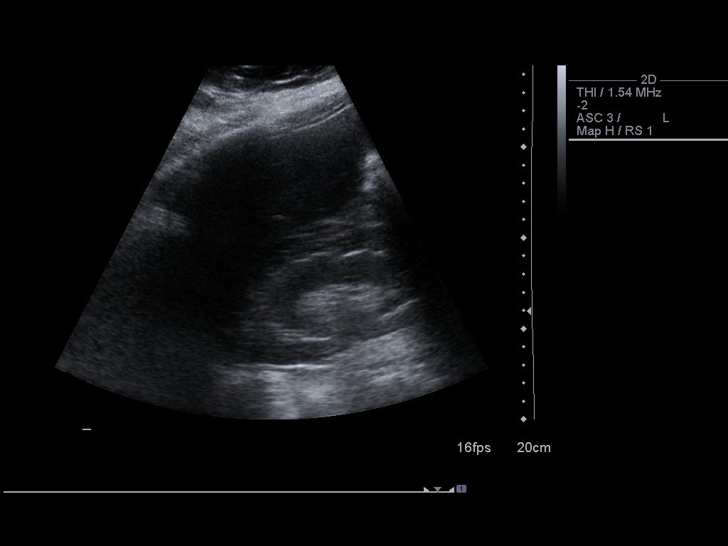
[im 21/25]
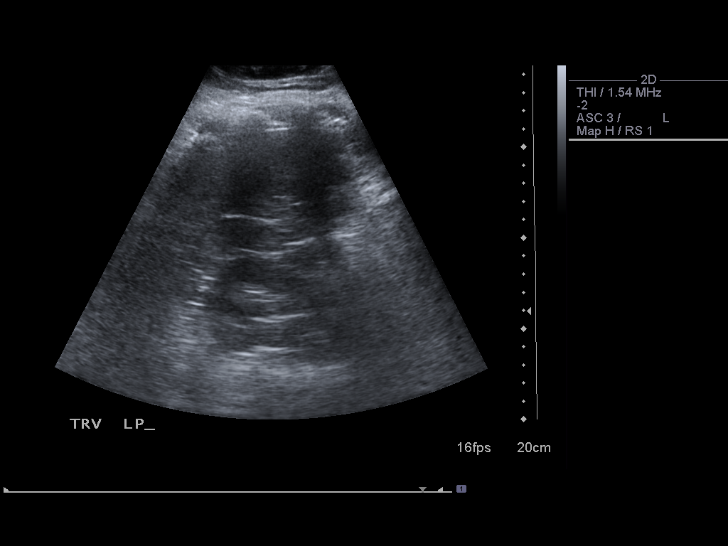
[im 23/25]
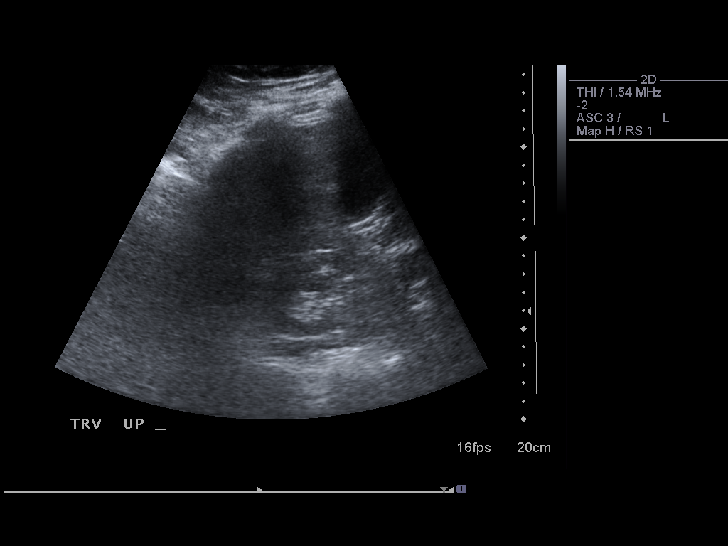
[im 25/25]
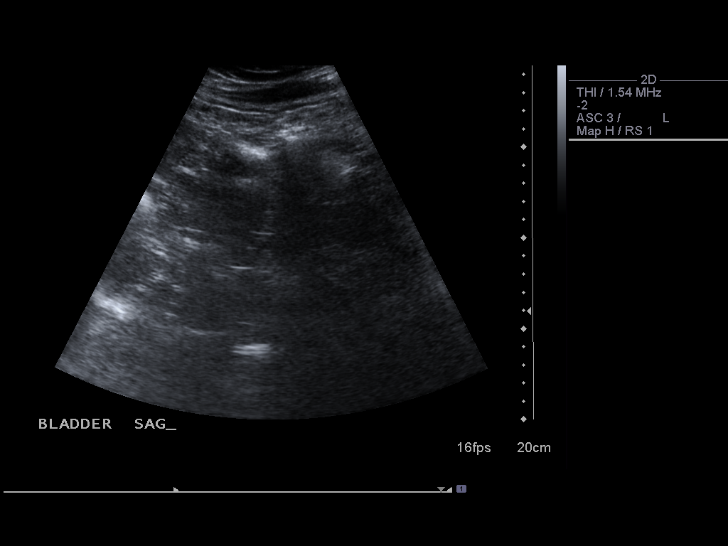

[14 of 25 positions shown; findings below may reference images not displayed]

FINDINGS: Right Kidney:  No hydronephrosis.  Well-preserved cortex.  Normal
parenchymal echotexture without focal parenchymal abnormality.
Renal length underestimated at 8.3 cm.

Left Kidney:  No hydronephrosis.  Well-preserved cortex.  Normal
parenchymal echotexture without focal parenchymal abnormality.
Approximately 10.0 cm length.

Bladder:  Decompressed by Foley catheter.
IMPRESSION: No evidence of hydronephrosis or focal parenchymal abnormalities
involving either kidney.

## 2010-09-11 IMAGING — CR DG FOOT COMPLETE 3+V*R*
3 series · 3 of 3 positions shown · non-contrast
Comparison: 04/17/2004

CLINICAL DATA: Pain and swelling.  Assess for osteomyelitis versus
gangrene.

RIGHT FOOT COMPLETE - 3+ VIEW

[view not recorded (1 of 3)]
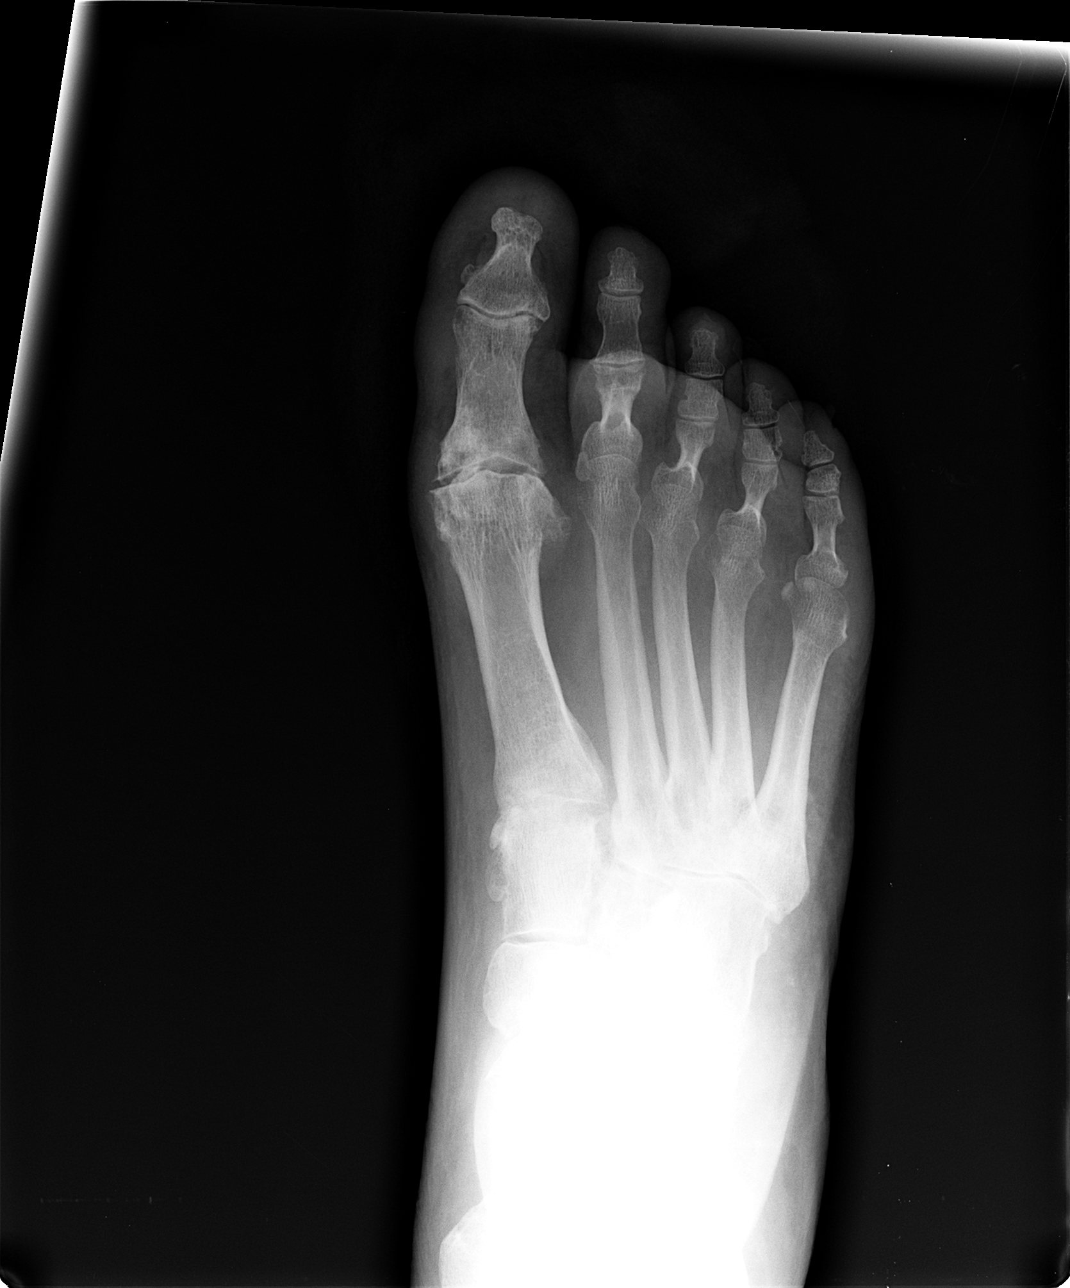

[view not recorded (2 of 3)]
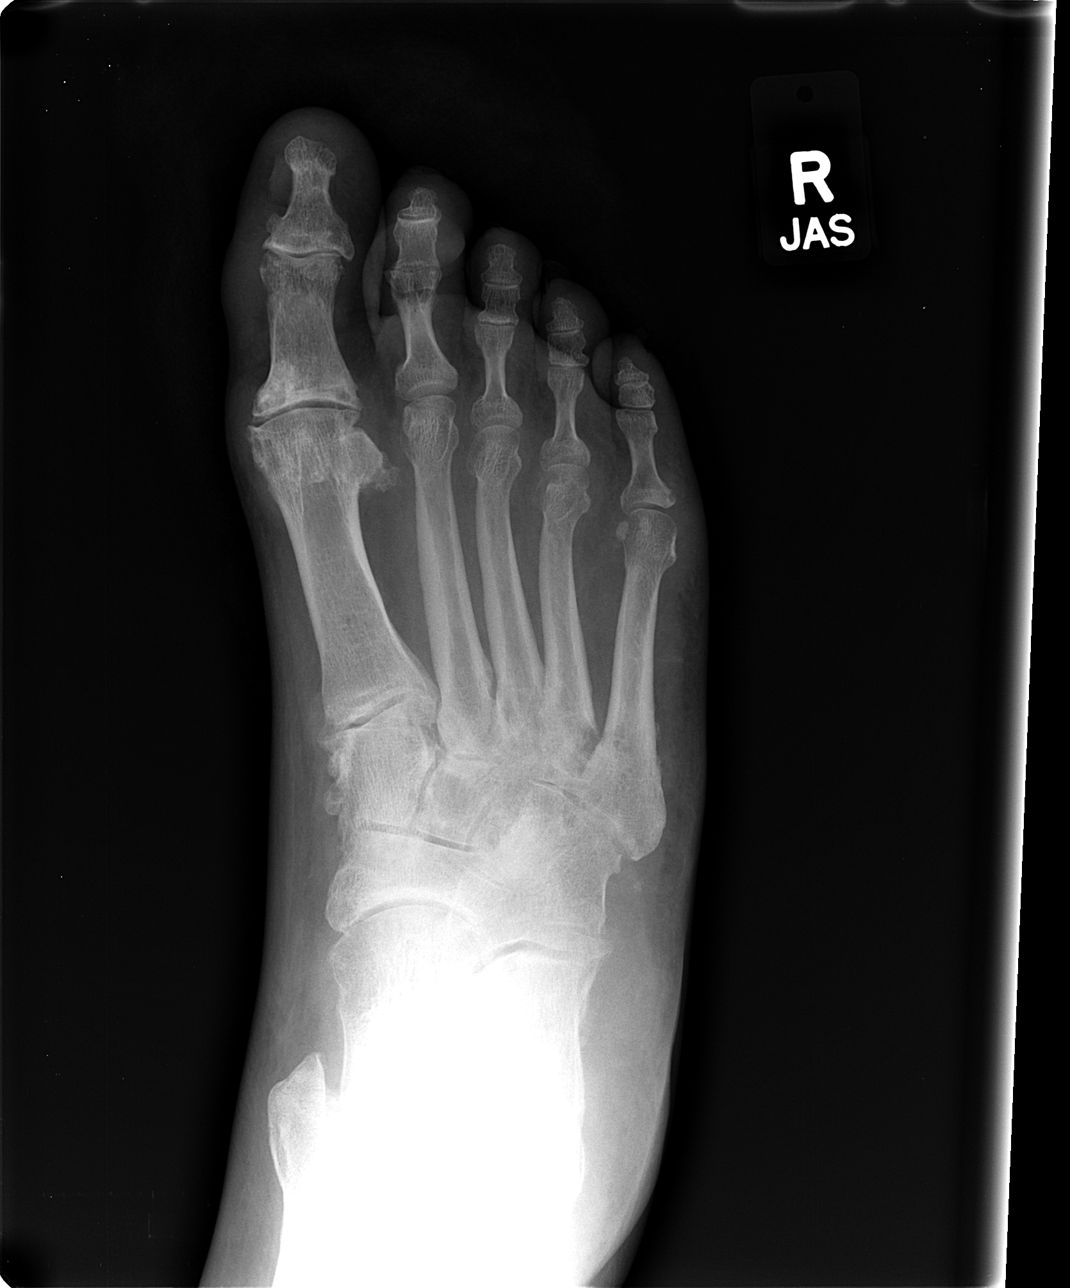

[view not recorded (3 of 3)]
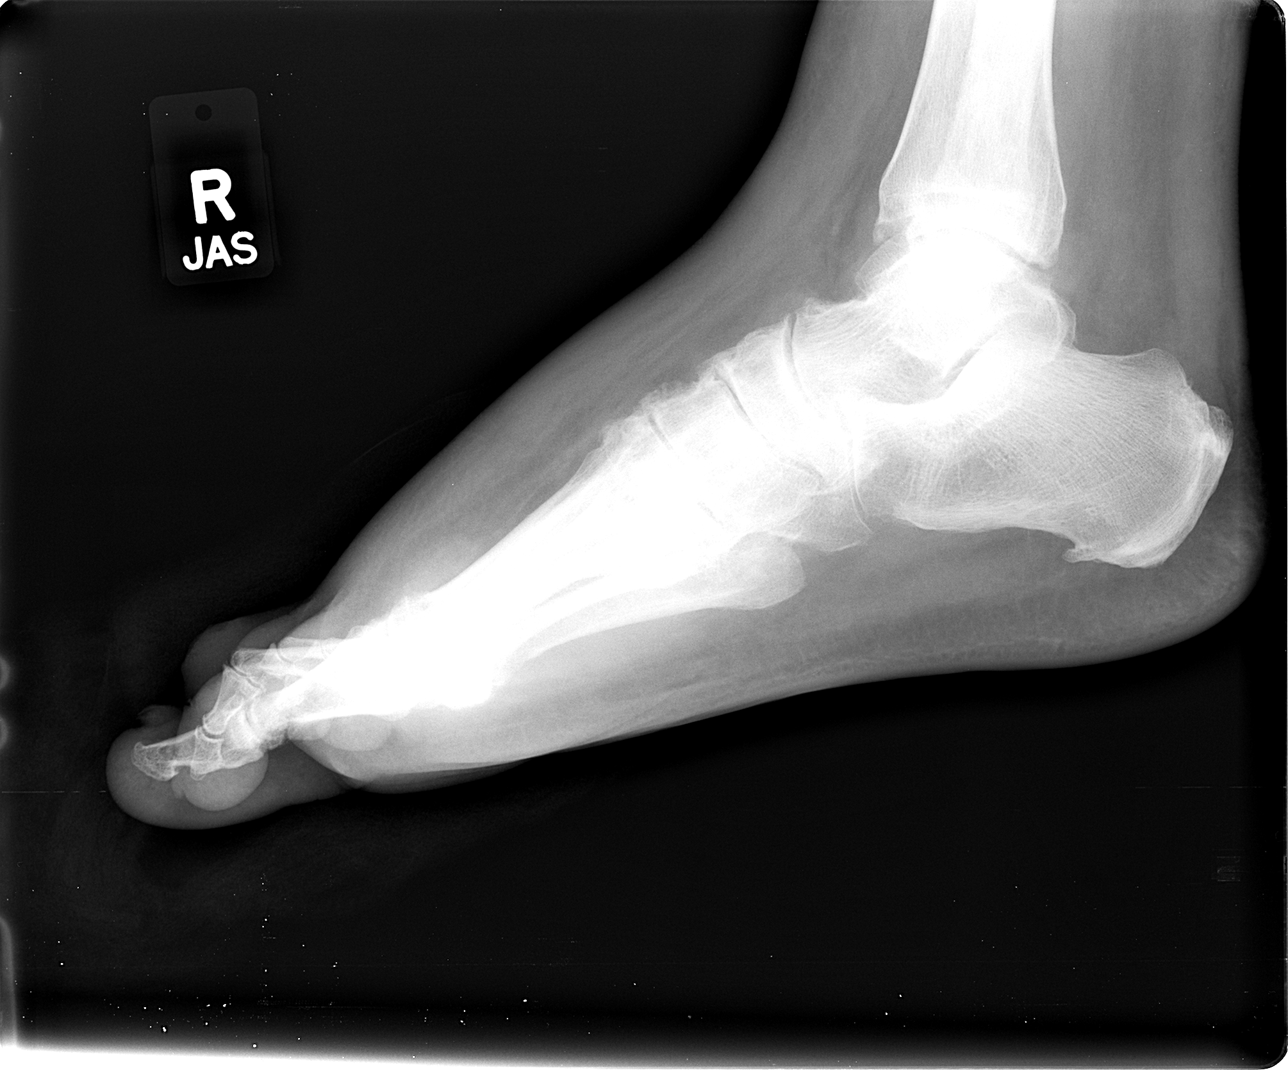

[3 of 3 positions shown; findings below may reference images not displayed]

FINDINGS: There is chronic arthropathy at the metatarsal phalangeal
joint of the great toe.  There are chronic degenerative changes in
the midfoot.  I do not see evidence of acute fracture or definite
osteomyelitis.  There is pronounced soft tissue swelling.
IMPRESSION: Soft tissue swelling.  Chronic joint pathology at the metatarsal
phalangeal joint of the great toe in the midfoot.  No sign of
fracture or definite osteomyelitis.

## 2010-09-11 IMAGING — CR DG FOOT COMPLETE 3+V*L*
4 series · 4 of 4 positions shown · non-contrast
Comparison: 09/08/2008

CLINICAL DATA: Pain and swelling.  Assess for osteomyelitis versus
gangrene.

LEFT FOOT - COMPLETE 3+ VIEW

[view not recorded (1 of 4)]
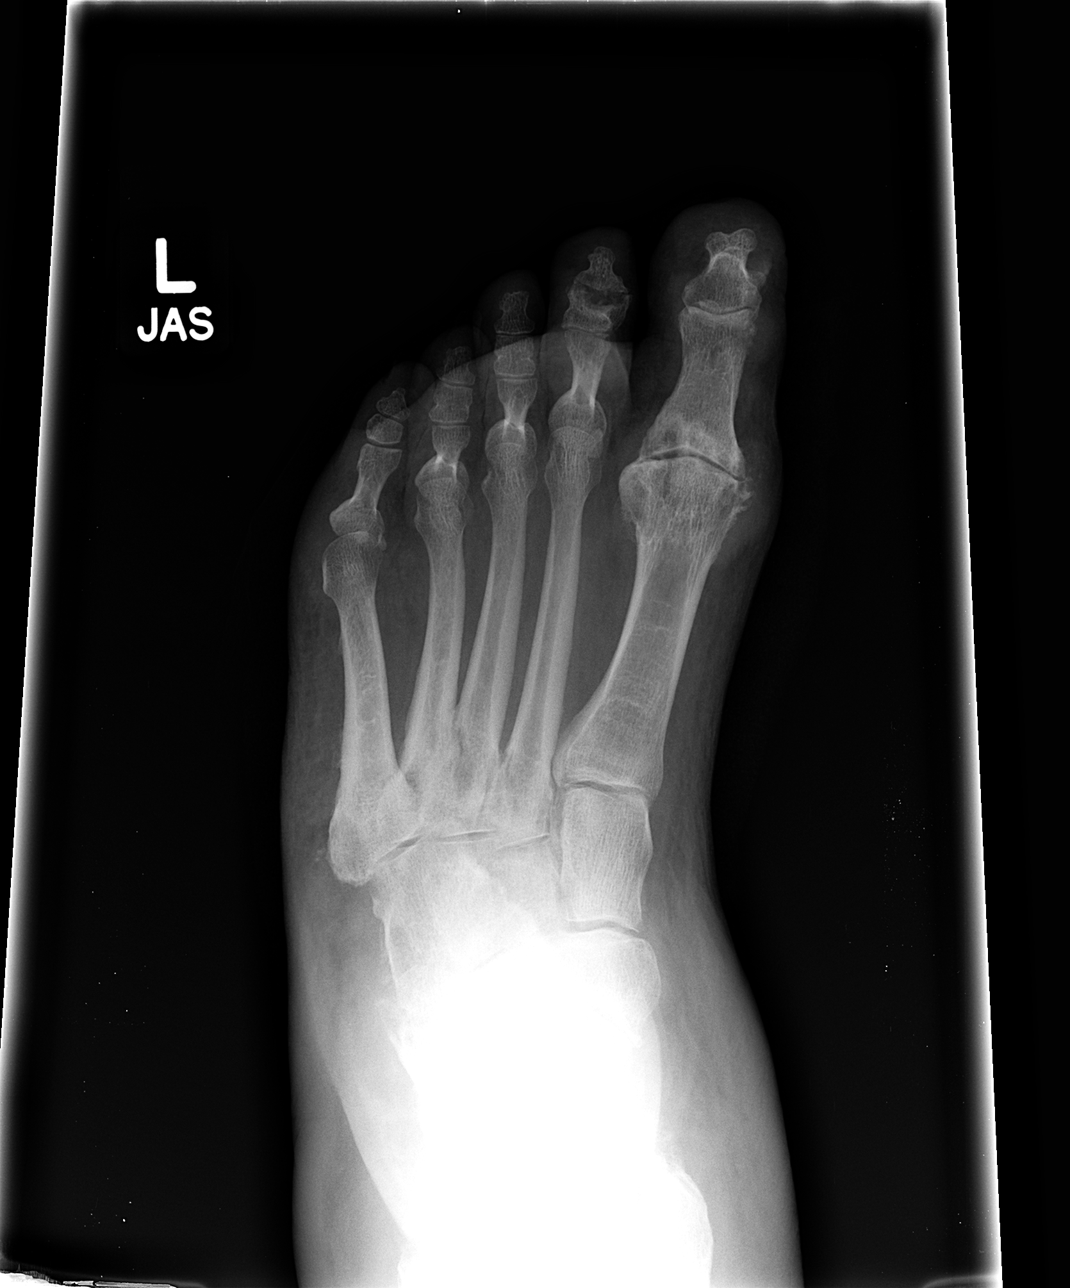

[view not recorded (2 of 4)]
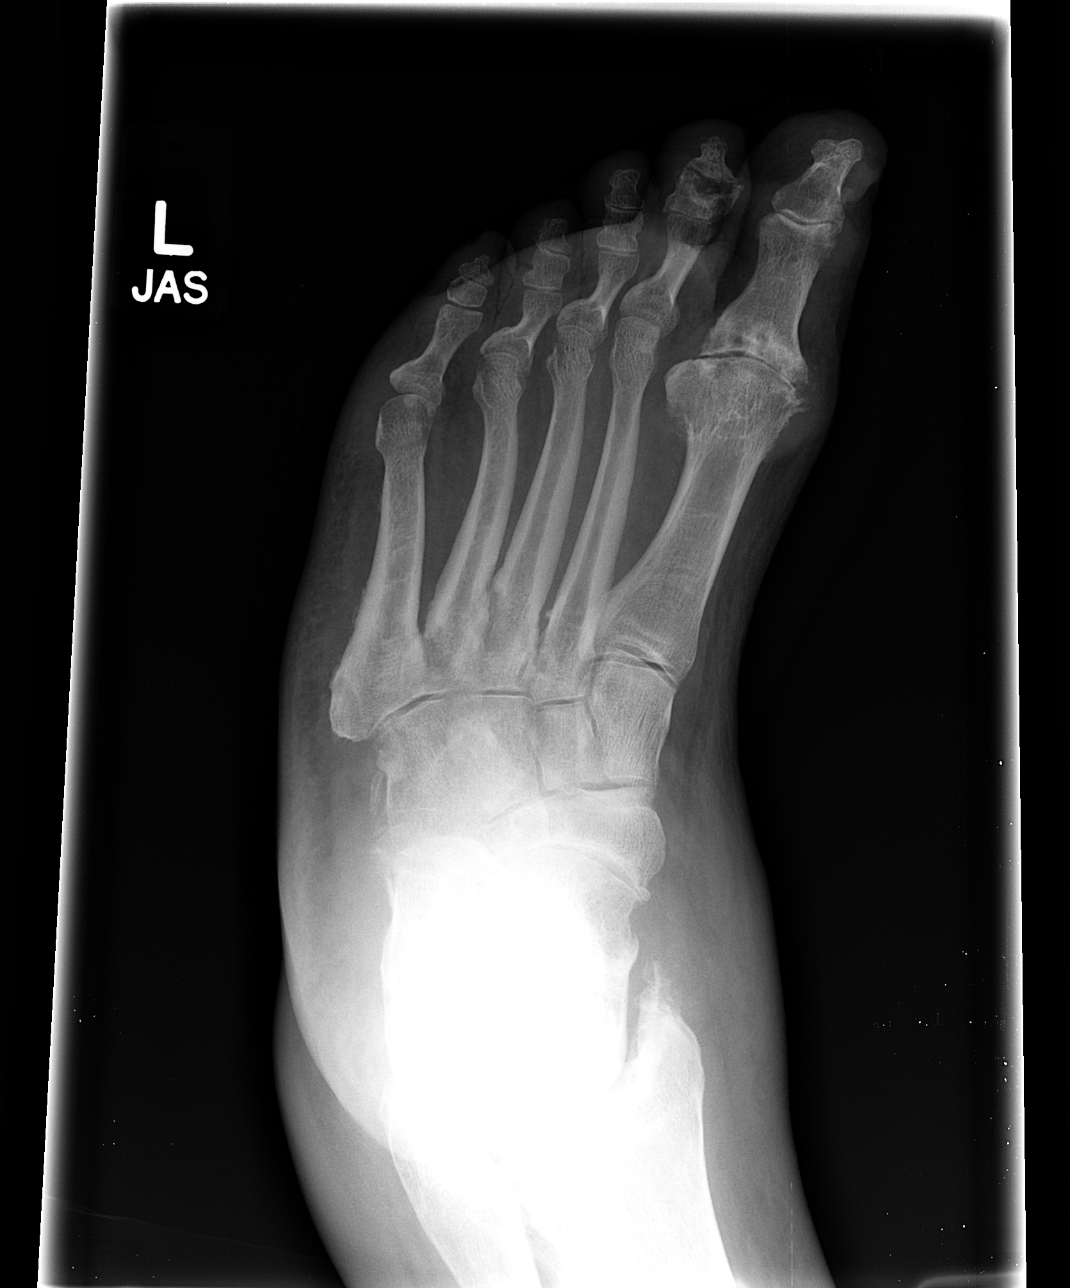

[view not recorded (3 of 4)]
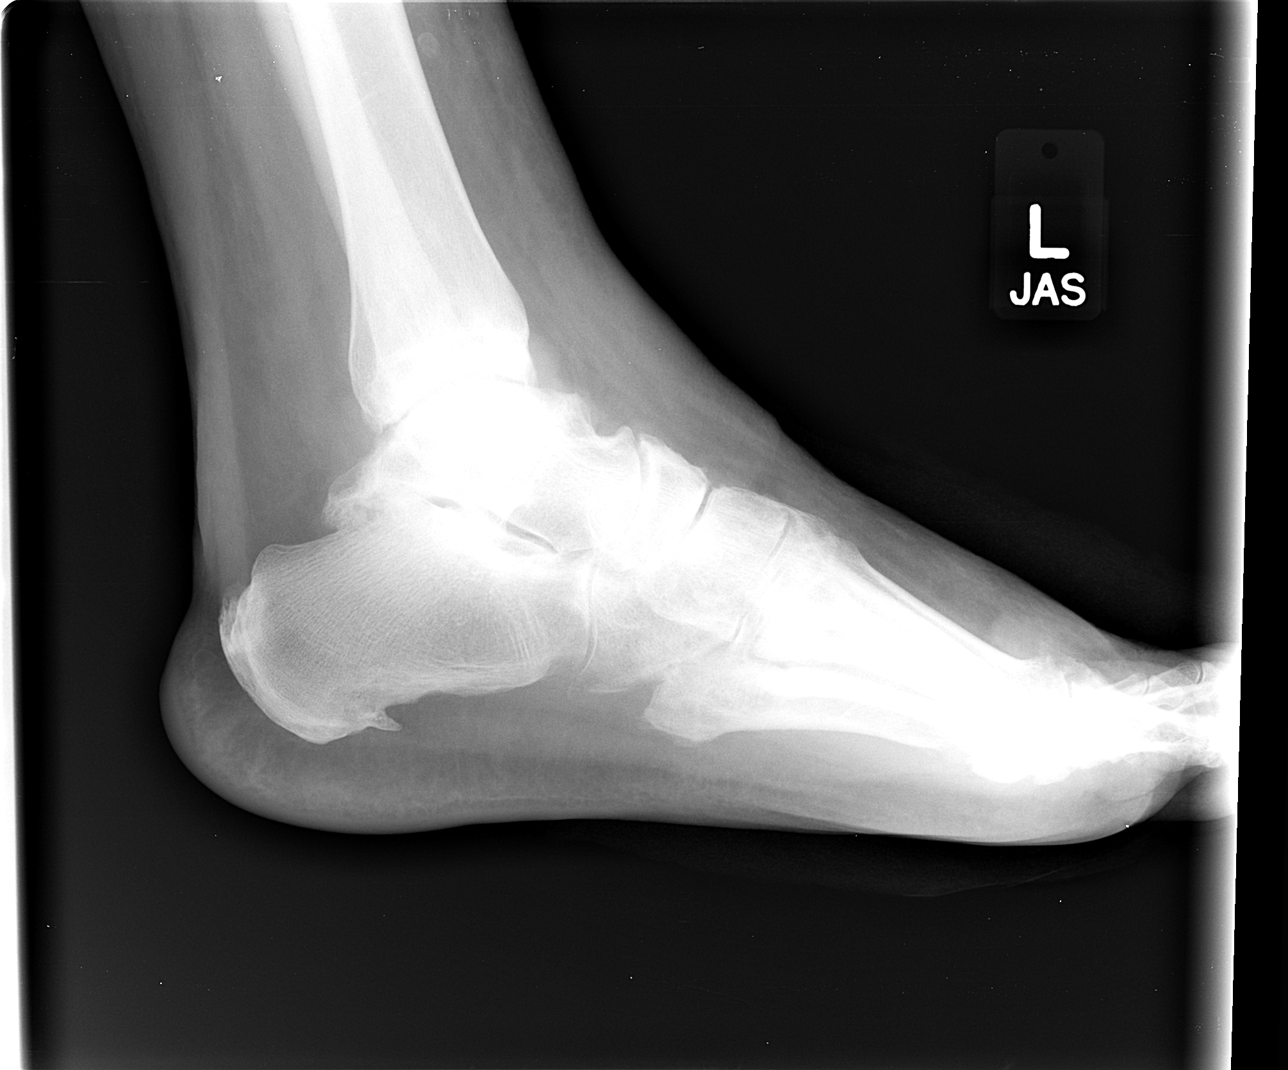

[view not recorded (4 of 4)]
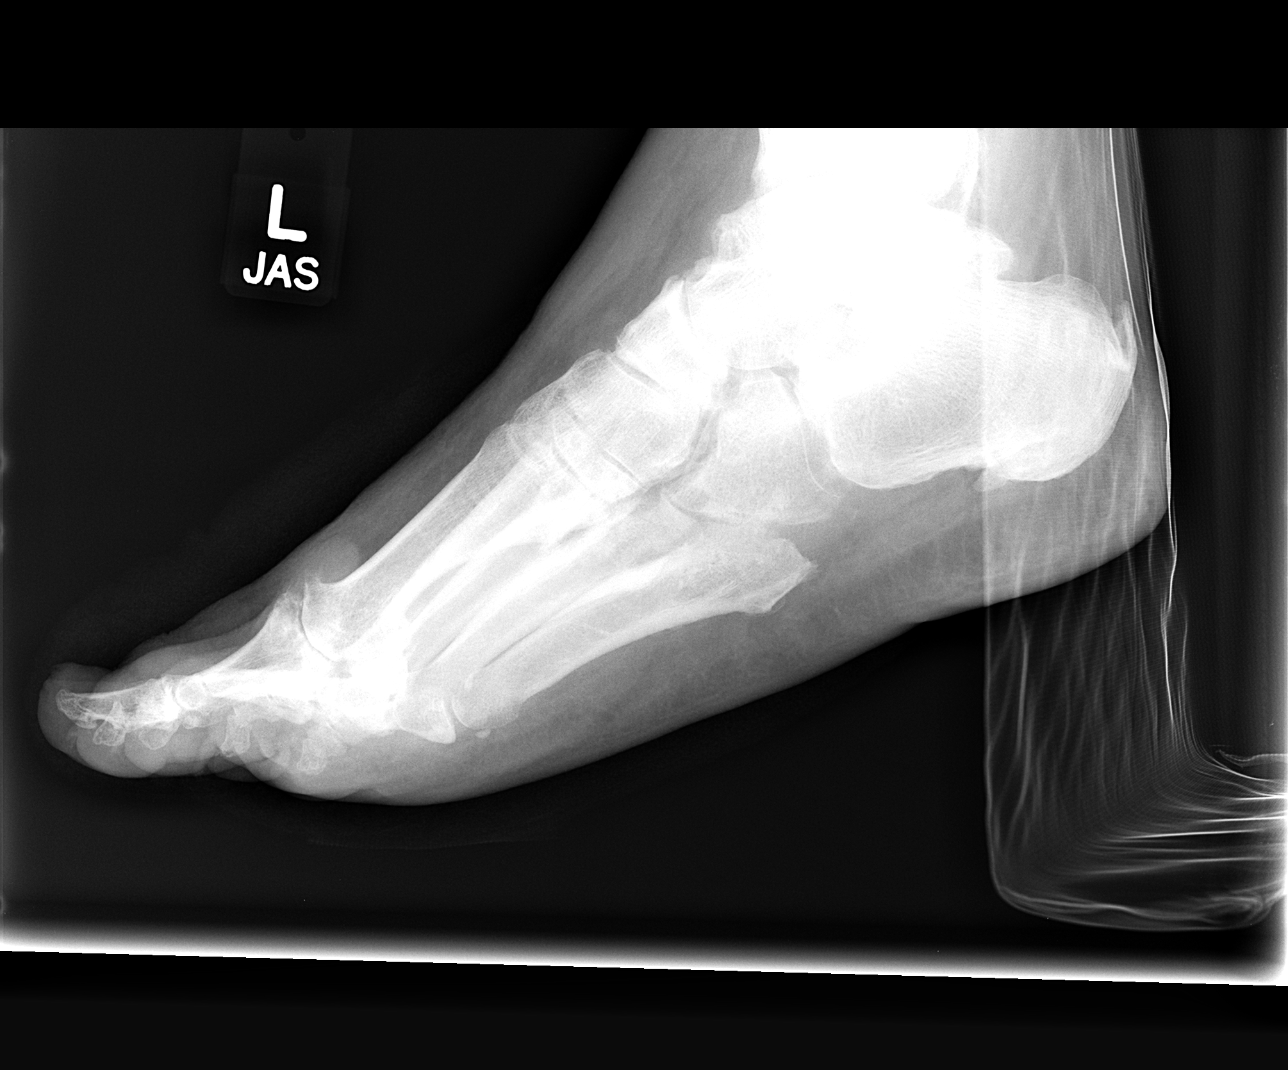

[4 of 4 positions shown; findings below may reference images not displayed]

FINDINGS: There is chronic arthropathy at the metatarsal phalangeal
joint of the great toe.  There is chronic deformity of the
interphalangeal joints of the second toe.  There are ordinary mild
degenerative changes in the midfoot.  There is soft tissue
swelling.  I do not see a definable osteomyelitis.
IMPRESSION: Chronic bone and joint changes.  No sign of acute fracture,
neuropathic change or definite osteomyelitis.

## 2010-09-12 ENCOUNTER — Emergency Department (HOSPITAL_COMMUNITY): Payer: Self-pay

## 2010-09-12 ENCOUNTER — Encounter (HOSPITAL_COMMUNITY): Payer: Self-pay | Admitting: *Deleted

## 2010-09-12 ENCOUNTER — Emergency Department (HOSPITAL_COMMUNITY)
Admission: EM | Admit: 2010-09-12 | Discharge: 2010-09-13 | Disposition: A | Discharge status: Home / Self Care | Payer: Self-pay | Attending: Emergency Medicine | Admitting: Emergency Medicine

## 2010-09-12 DIAGNOSIS — R5383 Other fatigue: Secondary | ICD-10-CM | POA: Insufficient documentation

## 2010-09-12 DIAGNOSIS — E119 Type 2 diabetes mellitus without complications: Secondary | ICD-10-CM | POA: Insufficient documentation

## 2010-09-12 DIAGNOSIS — M109 Gout, unspecified: Secondary | ICD-10-CM | POA: Insufficient documentation

## 2010-09-12 DIAGNOSIS — R5381 Other malaise: Secondary | ICD-10-CM | POA: Insufficient documentation

## 2010-09-12 DIAGNOSIS — I10 Essential (primary) hypertension: Secondary | ICD-10-CM | POA: Insufficient documentation

## 2010-09-12 DIAGNOSIS — M549 Dorsalgia, unspecified: Secondary | ICD-10-CM | POA: Insufficient documentation

## 2010-09-12 LAB — BASIC METABOLIC PANEL
BUN: 41 mg/dL — ABNORMAL HIGH (ref 6–23)
CO2: 21 mEq/L (ref 19–32)
Chloride: 106 mEq/L (ref 96–112)
Creatinine, Ser: 2.32 mg/dL — ABNORMAL HIGH (ref 0.50–1.35)
GFR calc Af Amer: 37 mL/min — ABNORMAL LOW (ref >60)
Glucose, Bld: 147 mg/dL — ABNORMAL HIGH (ref 70–99)
Potassium: 5.2 mEq/L — ABNORMAL HIGH (ref 3.5–5.1)

## 2010-09-12 LAB — DIFFERENTIAL
Basophils Relative: 0 % (ref 0–1)
Eosinophils Absolute: 0 10*3/uL (ref 0.0–0.7)
Eosinophils Relative: 0 % (ref 0–5)
Lymphs Abs: 1.1 10*3/uL (ref 0.7–4.0)
Monocytes Absolute: 0.6 10*3/uL (ref 0.1–1.0)

## 2010-09-12 LAB — CBC
Hemoglobin: 13.4 g/dL (ref 13.0–17.0)
MCH: 27.8 pg (ref 26.0–34.0)
MCHC: 31.4 g/dL (ref 30.0–36.0)
MCV: 88.6 fL (ref 78.0–100.0)
RBC: 4.82 MIL/uL (ref 4.22–5.81)

## 2010-09-12 IMAGING — CR DG BONE SURVEY MET
1 series · 1 of 1 positions shown · non-contrast
Comparison: None.

CLINICAL DATA: Biclonal gammopathy.

METASTATIC BONE SURVEY

[view not recorded]
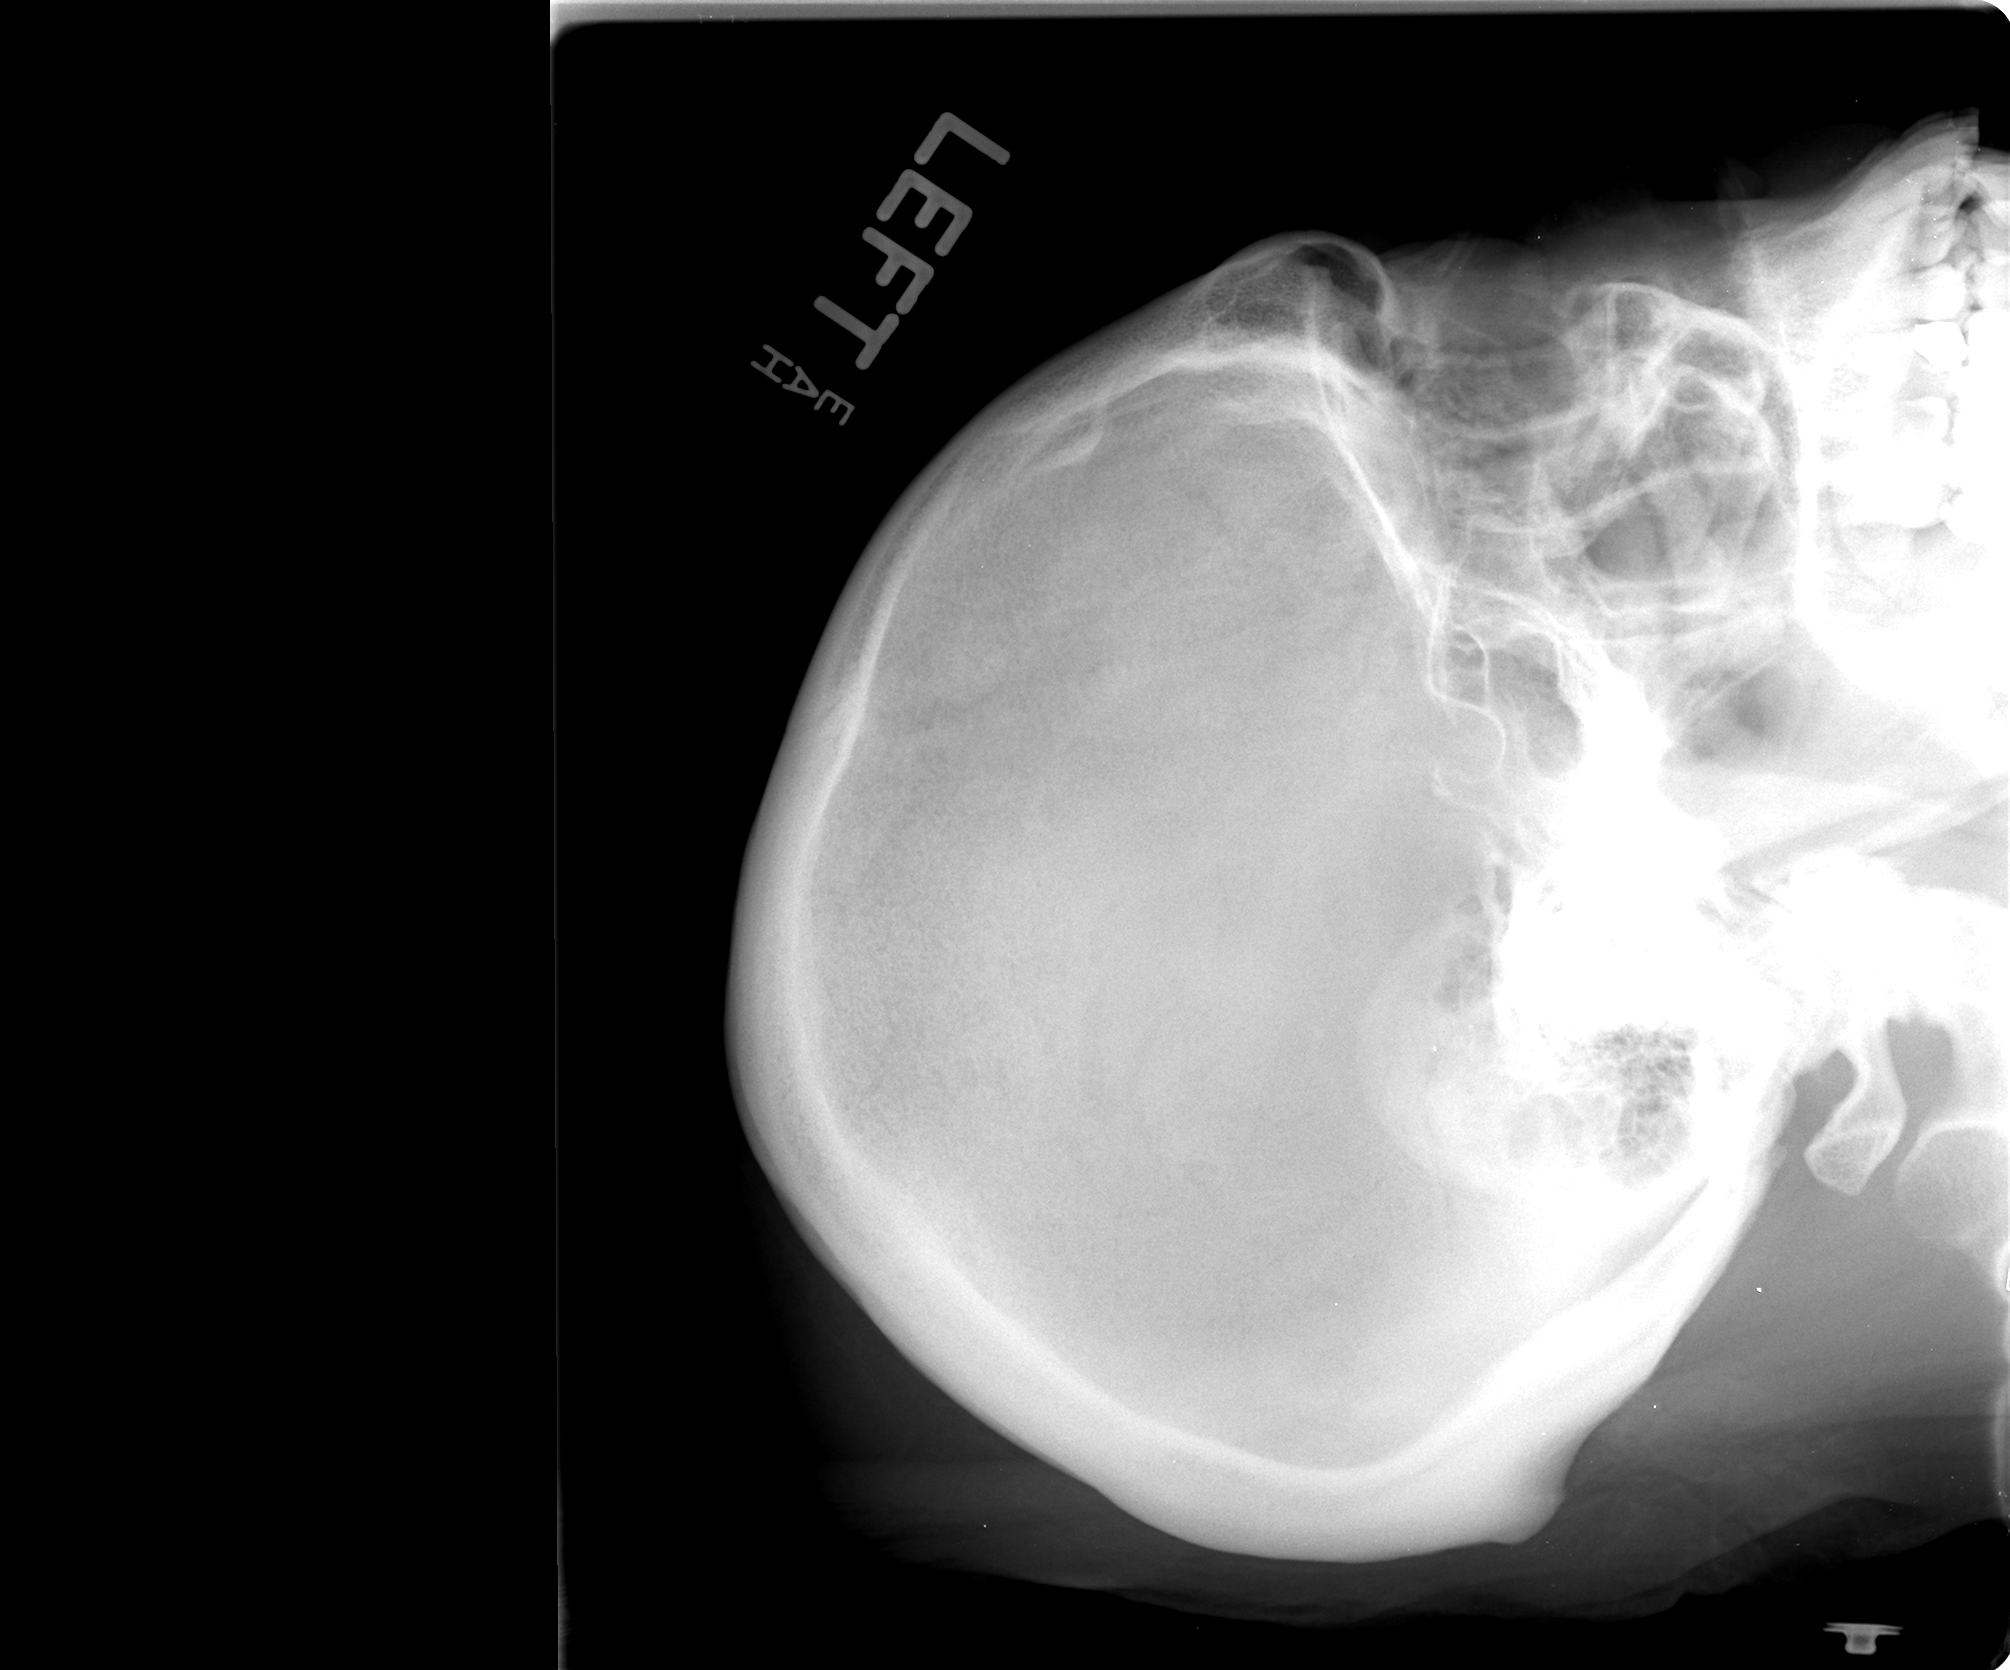

[1 of 1 positions shown; findings below may reference images not displayed]

FINDINGS: The metastatic bone survey demonstrates no discrete lytic
or blastic lesions in the skeleton.  There are mild degenerative
changes in the cervical spine and in the left shoulder there is
moderate arthritis.  There are mild degenerative changes in the
right elbow.

There is slight lucency behind the mandible behind the molars which
may be the remnant of the third molar tooth sockets.  This is not
definitive for bone lesions.
IMPRESSION: No discrete findings suggestive of myeloma.  Degenerative changes
in the cervical spine and left shoulder.

## 2010-09-12 MED ORDER — VANCOMYCIN HCL IN DEXTROSE 1-5 GM/200ML-% IV SOLN
1000.0000 mg | Freq: Once | INTRAVENOUS | Status: AC
  Administered 2010-09-12: 1000 mg via INTRAVENOUS
  Filled 2010-09-12: qty 200

## 2010-09-12 MED ORDER — PANTOPRAZOLE SODIUM 40 MG PO TBEC
40.0000 mg | DELAYED_RELEASE_TABLET | Freq: Once | ORAL | Status: AC
  Administered 2010-09-13: 40 mg via ORAL
  Filled 2010-09-12: qty 1

## 2010-09-12 MED ORDER — OXYCODONE-ACETAMINOPHEN 5-325 MG PO TABS
1.0000 | ORAL_TABLET | Freq: Once | ORAL | Status: AC
  Administered 2010-09-12: 1 via ORAL
  Filled 2010-09-12: qty 1

## 2010-09-12 MED ORDER — METHYLPREDNISOLONE SODIUM SUCC 125 MG IJ SOLR
60.0000 mg | Freq: Once | INTRAMUSCULAR | Status: AC
  Administered 2010-09-13: 60 mg via INTRAVENOUS
  Filled 2010-09-12: qty 2

## 2010-09-12 NOTE — ED Notes (Signed)
Pt requesting more to eat at this time. Pt rep advised he could have whatever she had.

## 2010-09-12 NOTE — ED Notes (Signed)
Patient to xray at this time

## 2010-09-12 NOTE — ED Provider Notes (Signed)
SEEN BY ME INFECTED TOE ? OSTEO OR SIG GOUT. RX WIT IV ANTIBIOTICS DW TRIAD FOR ADMISSION.   Medical screening examination/treatment/procedure(s) were conducted as a shared visit with non-physician practitioner(s) and myself.  I personally evaluated the patient during the encounter  Mervin Kung, MD 09/12/10 [id number]

## 2010-09-12 NOTE — ED Notes (Signed)
Pt given grham crackers at this time.

## 2010-09-12 NOTE — ED Notes (Signed)
Pt c/o pain to 2nd toe on both sides. Pt states his skin has "busted open" and is painful and burning. Pt states pain is radiating up legs.

## 2010-09-12 NOTE — ED Notes (Signed)
Patient is comfortable just wanting to know how much longer till discharge.

## 2010-09-12 NOTE — ED Notes (Signed)
Pt informed of the notification of the hospital md.

## 2010-09-12 NOTE — ED Notes (Signed)
Patient comfortable does not need anything at this time.

## 2010-09-12 NOTE — ED Notes (Signed)
hospitalist in room w/ pt.

## 2010-09-12 NOTE — ED Notes (Signed)
Pt requesting something else to eat at this time. No further needs voiced.

## 2010-09-13 NOTE — Consult Note (Signed)
PCP: Octavio Graves; Nolene Ebbs  Referring Physician: Dr. 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 Mobility due to multiple chronic illnesses  External hemorrhoid.  Recommendation: Temporarily Increase  steroid dose with a 3 day taper back to baseline dose. Continue current dose of colchicine Give a stat dose of PPI and resume twice-daily PPI Reaffirmed the importance of weight loss Reaffirmed the importance of coordinating his care with his primary care physician  There is no indication for inpatient management of this patient's condition.   HPI: Jack Irwin is a 45 y.o. AA male, disabled by it polyarticular gout, complicated by chronic kidney disease, and morbid obesity complicated by the need for steroid control of his chronic. Jack Irwin was last admitted to [facility] in April of this year for abdominal pain, tachycardia, and questionable cellulitis with leucocytosis. Was evaluated by the gastroenterology and cardiology services, was found to have esophageal erosions and started on a PPI, his heart rate was controlled with increasing doses of beta blocker, and he was determined to have no evidence of, but extensive evidence of t gout. He was discharged in stable condition. Jack Irwin now reports increasing pain in the left second toe for the past few days and a lump at his anus for the past few weeks. Concern that the tumor may be infected and came to the emergency room to be evaluated. He has had no fever or chills, he has been taking his medication regularly, he does very little if any ambulation because of his disability  Past Medical History  Diagnosis Date  .  Hypertension   . Renal insufficiency   . Diabetes mellitus   . Gout   . Chronic back pain     Past Surgical History  Procedure Date  . None     Medications: I have reviewed the patient's current medications.  Allergies: No Known Allergies  Social History:  reports that he has never smoked. He has never used smokeless tobacco. He reports that he does not drink alcohol or use illicit drugs. 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  Family History  Problem Relation Age of Onset  . Diabetes Mother   . Hypertension Mother   . Heart failure Mother   . Hyperlipidemia Mother   . Cancer Father   . Diabetes Father   . Hypertension Father   . Hyperlipidemia Father     @ROS @ Jack Irwin reports also abdominal pain, increasing weight but apart from this a complete review of systems is unremarkable  Blood pressure 157/84, pulse 80, temperature 98 F (36.7 C), temperature source Oral, resp. rate 20, height 5\' 11"  (1.803 m), weight 135.626 kg (299 lb), SpO2 95.00%. Physical exam: GEN: Obese African American gentleman lying flat in the stretcher in no acute respiratory distress. Also does not appear to be in painful distress. Seems most concerned about the lump in his anus.  HEENT: Pupils are round and equal his mucous membranes are pale and anicteric; he has a very thick neck, no lymphadenopathy no lymphadenopathy no thyromegaly. CHEST: He has no chest wall tenderness RESP: Lungs are clear to auscultation bilaterally ABD: His abdomen is massively obese, soft nontender, large pannus, no intertriginous  rash. EXT he has trace edema of both lower extremities; he has arthritic deformities of both hands and feet and toes, with multiple tophi visible on the joints of his fingers and toes, most notable so on the second toes of both feet; there is no redness of the toe there is no discharge from the toe. There is no calf tenderness. PSYCH: He is alert and  oriented x3; he is mildly anxious. RECTAL EXAM: There is soft brown stool around the anus; small nontender external hemorrhoid in the 7:00 position; anal tone is normal there are no masses palpated and the rectum is full of stool.    Results for orders placed during the hospital encounter of 09/12/10 (from the past 48 hour(s))  GLUCOSE, CAPILLARY     Status: Abnormal   Collection Time   09/12/10  4:49 PM      Component Value Range Comment   Glucose-Capillary 136 (*) 70 - 99 (mg/dL)   CBC     Status: Abnormal   Collection Time   09/12/10  4:50 PM      Component Value Range Comment   WBC 16.2 (*) 4.0 - 10.5 (K/uL)    RBC 4.82  4.22 - 5.81 (MIL/uL)    Hemoglobin 13.4  13.0 - 17.0 (g/dL)    HCT 42.7  39.0 - 52.0 (%)    MCV 88.6  78.0 - 100.0 (fL)    MCH 27.8  26.0 - 34.0 (pg)    MCHC 31.4  30.0 - 36.0 (g/dL)    RDW 17.8 (*) 11.5 - 15.5 (%)    Platelets 134 (*) 150 - 400 (K/uL)   DIFFERENTIAL     Status: Abnormal   Collection Time   09/12/10  4:50 PM      Component Value Range Comment   Neutrophils Relative 89 (*) 43 - 77 (%)    Lymphocytes Relative 7 (*) 12 - 46 (%)    Monocytes Relative 4  3 - 12 (%)    Eosinophils Relative 0  0 - 5 (%)    Basophils Relative 0  0 - 1 (%)    Neutro Abs 14.5 (*) 1.7 - 7.7 (K/uL)    Lymphs Abs 1.1  0.7 - 4.0 (K/uL)    Monocytes Absolute 0.6  0.1 - 1.0 (K/uL)    Eosinophils Absolute 0.0  0.0 - 0.7 (K/uL)    Basophils Absolute 0.0  0.0 - 0.1 (K/uL)    RBC Morphology POLYCHROMASIA PRESENT      WBC Morphology WHITE COUNT CONFIRMED ON SMEAR      Smear Review PLATELET COUNT CONFIRMED BY SMEAR   LARGE PLATELETS PRESENT  BASIC METABOLIC PANEL     Status: Abnormal   Collection Time   09/12/10  4:50 PM      Component Value Range Comment   Sodium 139  135 - 145 (mEq/L)    Potassium 5.2 (*) 3.5 - 5.1 (mEq/L)    Chloride 106  96 - 112 (mEq/L)    CO2 21  19 - 32 (mEq/L)    Glucose, Bld 147 (*) 70 - 99 (mg/dL)    BUN 41 (*) 6 - 23 (mg/dL)    Creatinine, Ser  2.32 (*) 0.50 - 1.35 (mg/dL)    Calcium 9.2  8.4 - 10.5 (mg/dL)    GFR calc non Af Amer 31 (*) >60 (mL/min)    GFR calc Af Amer 37 (*) >60 (mL/min)     Dg Toe 2nd Left  09/12/2010  *RADIOLOGY REPORT*  Clinical Data: Rule out osteomyelitis in the second toe.  LEFT TOE - 2+ VIEW  Comparison: Left foot exam from 07/16/2009 and 05/06/2009  Findings: Three images of the left second toe were obtained.  Again noted is deformity of the second toe middle phalanx with bone loss or erosions near the PIP joint.  Severe degenerative changes involving the first toe MTP joint.  There is joint space loss involving the second toe PIP joint with small erosions in this area.  IMPRESSION: Chronic deformity with erosive changes involving the second toe interphalangeal joints.  There has been minimal change since the prior examinations.  Findings are consistent with previous inflammation such as gout or osteomyelitis.  MRI would be more sensitive and specific to evaluate for acute inflammation and osteomyelitis.  Original Report Authenticated By: Markus Daft, M.D.        Calina Patrie 09/13/2010, 12:34 AM

## 2010-09-14 NOTE — ED Provider Notes (Signed)
History     CSN: [id number] Arrival date & time: 09/12/2010  3:49 PM  Chief Complaint  Patient presents with  . Toe Pain  . Leg Pain   Patient is a 45 y.o. male presenting with toe pain and leg pain. The history is provided by the patient.  Toe Pain This is a recurrent problem. The current episode started in the past 7 days. The problem occurs constantly (He descrbies aching pain that radiates up his left leg.). The problem has been gradually worsening. Associated symptoms include fatigue and joint swelling. Pertinent negatives include no abdominal pain, arthralgias, chest pain, chills, congestion, diaphoresis, fever, headaches, myalgias, nausea, neck pain, numbness, rash, sore throat, vomiting or weakness. The symptoms are aggravated by walking and standing. Treatments tried: Has taken prednisone today. The treatment provided no relief.  Leg Pain  Pertinent negatives include no numbness.    Past Medical History  Diagnosis Date  . Hypertension   . Renal insufficiency   . Diabetes mellitus   . Gout   . Chronic back pain     Past Surgical History  Procedure Date  . None     Family History  Problem Relation Age of Onset  . Diabetes Mother   . Hypertension Mother   . Heart failure Mother   . Hyperlipidemia Mother   . Cancer Father   . Diabetes Father   . Hypertension Father   . Hyperlipidemia Father     History  Substance Use Topics  . Smoking status: Never Smoker   . Smokeless tobacco: Never Used  . Alcohol Use: No      Review of Systems  Constitutional: Positive for fatigue. Negative for fever, chills and diaphoresis.  HENT: Negative for congestion, sore throat and neck pain.   Eyes: Negative.   Respiratory: Negative for chest tightness and shortness of breath.   Cardiovascular: Negative for chest pain.  Gastrointestinal: Negative for nausea, vomiting and abdominal pain.  Genitourinary: Negative.   Musculoskeletal: Positive for joint swelling. Negative for  myalgias and arthralgias.  Skin: Negative.  Negative for rash and wound.  Neurological: Negative for dizziness, weakness, light-headedness, numbness and headaches.  Hematological: Negative.   Psychiatric/Behavioral: Negative.     Physical Exam  BP 157/84  Pulse 80  Temp(Src) 98 F (36.7 C) (Oral)  Resp 20  Ht 5\' 11"  (1.803 m)  Wt 299 lb (135.626 kg)  BMI 41.70 kg/m2  SpO2 95%  Physical Exam  Vitals reviewed. Constitutional: He is oriented to person, place, and time. He appears well-developed and well-nourished.       Obese   HENT:  Head: Normocephalic and atraumatic.  Eyes: Conjunctivae are normal.  Neck: Normal range of motion.  Cardiovascular: Normal rate, regular rhythm, normal heart sounds and intact distal pulses.   Pulmonary/Chest: Effort normal and breath sounds normal. He has no wheezes.  Abdominal: Soft. Bowel sounds are normal. There is no tenderness.  Musculoskeletal: Normal range of motion. He exhibits edema and tenderness.       Feet:  Neurological: He is alert and oriented to person, place, and time.  Skin: Skin is warm and dry.  Psychiatric: He has a normal mood and affect.    ED Course  Procedures  MDM  IV vanc 1 gram given in ed while awaiting disposition.  Spoke with Dr. Megan Salon with [facility] for possible admission.  Ddx exacerbation of gout vs possible cellulitis/ toe infection.  Dr. Megan Salon did consult pt in ed and concurs presentation most consistent with  gouty flare with tophi.  Recommends rapid prednisone taper,  Outpatient f/u.  Patients labs and/or radiological studies were reviewed during the medical decision making and disposition process.        Fulton Reek, [place] 09/14/10 1243

## 2010-09-25 ENCOUNTER — Encounter (HOSPITAL_COMMUNITY): Payer: Self-pay | Admitting: *Deleted

## 2010-09-25 ENCOUNTER — Emergency Department (HOSPITAL_COMMUNITY)
Admission: EM | Admit: 2010-09-25 | Discharge: 2010-09-25 | Disposition: A | Discharge status: Home / Self Care | Payer: Self-pay | Attending: Emergency Medicine | Admitting: Emergency Medicine

## 2010-09-25 DIAGNOSIS — M109 Gout, unspecified: Secondary | ICD-10-CM | POA: Insufficient documentation

## 2010-09-25 DIAGNOSIS — I10 Essential (primary) hypertension: Secondary | ICD-10-CM | POA: Insufficient documentation

## 2010-09-25 DIAGNOSIS — N498 Inflammatory disorders of other specified male genital organs: Secondary | ICD-10-CM | POA: Insufficient documentation

## 2010-09-25 DIAGNOSIS — E119 Type 2 diabetes mellitus without complications: Secondary | ICD-10-CM | POA: Insufficient documentation

## 2010-09-25 DIAGNOSIS — L0291 Cutaneous abscess, unspecified: Secondary | ICD-10-CM

## 2010-09-25 DIAGNOSIS — R51 Headache: Secondary | ICD-10-CM | POA: Insufficient documentation

## 2010-09-25 DIAGNOSIS — M549 Dorsalgia, unspecified: Secondary | ICD-10-CM | POA: Insufficient documentation

## 2010-09-25 DIAGNOSIS — N289 Disorder of kidney and ureter, unspecified: Secondary | ICD-10-CM | POA: Insufficient documentation

## 2010-09-25 MED ORDER — ONDANSETRON HCL 4 MG PO TABS
4.0000 mg | ORAL_TABLET | Freq: Once | ORAL | Status: AC
  Administered 2010-09-25: 4 mg via ORAL

## 2010-09-25 MED ORDER — OXYCODONE-ACETAMINOPHEN 5-325 MG PO TABS
2.0000 | ORAL_TABLET | Freq: Once | ORAL | Status: AC
  Administered 2010-09-25: 2 via ORAL

## 2010-09-25 MED ORDER — CEFTRIAXONE SODIUM 1 G IJ SOLR
1.0000 g | Freq: Once | INTRAMUSCULAR | Status: AC
  Administered 2010-09-25: 1 g via INTRAMUSCULAR

## 2010-09-25 MED ORDER — METHYLPREDNISOLONE SODIUM SUCC 125 MG IJ SOLR
125.0000 mg | Freq: Once | INTRAMUSCULAR | Status: AC
Start: 2010-09-25 — End: 2010-09-25
  Administered 2010-09-25: 125 mg via INTRAMUSCULAR

## 2010-09-25 MED ORDER — DOXYCYCLINE HYCLATE 100 MG PO CAPS: 100.0000 mg | ORAL_CAPSULE | Freq: Two times a day (BID) | ORAL | Status: AC

## 2010-09-25 MED ORDER — DOXYCYCLINE HYCLATE 100 MG PO TABS
100.0000 mg | ORAL_TABLET | Freq: Once | ORAL | Status: AC
  Administered 2010-09-25: 100 mg via ORAL

## 2010-09-25 NOTE — ED Provider Notes (Signed)
History     CSN: [id number] Arrival date & time: 09/25/2010  5:55 PM  Chief Complaint  Patient presents with  . Headache   HPI Comments: Pt reports an abscess of the scrotum, just under the penis that has some drainage. He also c/o frontal headache for 4 to 5 days. He reports being seen by his primary MD about 1 week ago. His blood pressure was elevated and amlodipine was added to current meds. He states he has not filled the Rx yet. No hx of injury to the head. He also reports his toes burning and paining him. This is not new an is similar to previous gout episodes. Pt would like to be evaluated for each of these problems.  Patient is a 45 y.o. male presenting with headaches. The history is provided by the patient.  Headache  This is a recurrent problem. The current episode started more than 2 days ago. The problem has not changed since onset.The headache is associated with nothing. The pain is located in the frontal region. The quality of the pain is described as throbbing. The pain is at a severity of 8/10. The pain is moderate. Associated symptoms include nausea. Pertinent negatives include no anorexia, no malaise/fatigue, no near-syncope, no palpitations, no syncope, no shortness of breath and no vomiting. Treatments tried: His own meds. The treatment provided mild relief.    Past Medical History  Diagnosis Date  . Hypertension   . Renal insufficiency   . Diabetes mellitus   . Gout   . Chronic back pain     Past Surgical History  Procedure Date  . None     Family History  Problem Relation Age of Onset  . Diabetes Mother   . Hypertension Mother   . Heart failure Mother   . Hyperlipidemia Mother   . Cancer Father   . Diabetes Father   . Hypertension Father   . Hyperlipidemia Father     History  Substance Use Topics  . Smoking status: Never Smoker   . Smokeless tobacco: Never Used  . Alcohol Use: No      Review of Systems  Constitutional: Negative for  malaise/fatigue and activity change.       All ROS Neg except as noted in HPI  HENT: Negative for nosebleeds and neck pain.   Eyes: Negative for photophobia and discharge.  Respiratory: Negative for cough, shortness of breath and wheezing.   Cardiovascular: Negative for chest pain, palpitations, syncope and near-syncope.  Gastrointestinal: Positive for nausea. Negative for vomiting, abdominal pain, blood in stool and anorexia.  Genitourinary: Negative for dysuria, frequency and hematuria.  Musculoskeletal: Positive for back pain, joint swelling, arthralgias and gait problem.  Skin: Negative.   Neurological: Positive for headaches. Negative for dizziness, seizures and speech difficulty.  Psychiatric/Behavioral: Negative for hallucinations and confusion.    Physical Exam  BP 149/110  Pulse 114  Temp(Src) 99 F (37.2 C) (Oral)  Resp 12  Ht 5\' 11"  (1.803 m)  Wt 299 lb (135.626 kg)  BMI 41.70 kg/m2  SpO2 99%  Physical Exam  Nursing note and vitals reviewed. Constitutional: He is oriented to person, place, and time. He appears well-developed and well-nourished.  Non-toxic appearance.  HENT:  Head: Normocephalic.  Right Ear: Tympanic membrane and external ear normal.  Left Ear: Tympanic membrane and external ear normal.  Eyes: EOM and lids are normal. Pupils are equal, round, and reactive to light.  Neck: Normal range of motion. Neck supple. Carotid bruit is not  present.  Cardiovascular: Normal rate, regular rhythm, normal heart sounds, intact distal pulses and normal pulses.   Pulmonary/Chest: Breath sounds normal. No respiratory distress.  Abdominal: Soft. Bowel sounds are normal. There is no tenderness. There is no guarding.  Genitourinary:       Pt has a denuded area about 1cm x 1cm that is tender on the scrotum, just under the penis. No testicular tenderness or swelling. No additional abscess area. (pt states he popped the abscess on the scrotum and it has been painful since that  time. No penile lesions or discharge.  Musculoskeletal: Normal range of motion.       There is swelling of both lower ext. There is swelling and tenderness  of the MP joint area of both 1st toes. The dorsum of both feet are sore. No lesions  Under the toes. No swelling of the knees, hands or wrist.  Lymphadenopathy:       Head (right side): No submandibular adenopathy present.       Head (left side): No submandibular adenopathy present.    He has no cervical adenopathy.  Neurological: He is alert and oriented to person, place, and time. He has normal strength. No cranial nerve deficit or sensory deficit. Coordination normal.  Skin: Skin is warm and dry.  Psychiatric: He has a normal mood and affect. His speech is normal.    ED Course  Procedures  MDM I have reviewed nursing notes, vital signs, and all appropriate lab and imaging results for this patient.      Lenox Ahr, [place] 09/25/10 1921

## 2010-09-25 NOTE — Progress Notes (Signed)
Previous ED visits and noted reviewed for this pat. Pain much improved. Plan for antibiotic, prednisone, and pain meds discussed with pt.

## 2010-09-25 NOTE — ED Notes (Signed)
Crackers and grape juice provided per pt request.

## 2010-09-25 NOTE — ED Notes (Signed)
Headache x 4-5 days, right great toe pain, and abscess that's draining near penis.

## 2010-10-01 ENCOUNTER — Emergency Department (HOSPITAL_COMMUNITY)
Admission: EM | Admit: 2010-10-01 | Discharge: 2010-10-01 | Disposition: A | Discharge status: Home / Self Care | Payer: Self-pay | Attending: Emergency Medicine | Admitting: Emergency Medicine

## 2010-10-01 ENCOUNTER — Encounter (HOSPITAL_COMMUNITY): Payer: Self-pay

## 2010-10-01 DIAGNOSIS — M549 Dorsalgia, unspecified: Secondary | ICD-10-CM | POA: Insufficient documentation

## 2010-10-01 DIAGNOSIS — N289 Disorder of kidney and ureter, unspecified: Secondary | ICD-10-CM | POA: Insufficient documentation

## 2010-10-01 DIAGNOSIS — R42 Dizziness and giddiness: Secondary | ICD-10-CM | POA: Insufficient documentation

## 2010-10-01 DIAGNOSIS — R1032 Left lower quadrant pain: Secondary | ICD-10-CM | POA: Insufficient documentation

## 2010-10-01 DIAGNOSIS — I10 Essential (primary) hypertension: Secondary | ICD-10-CM | POA: Insufficient documentation

## 2010-10-01 DIAGNOSIS — E119 Type 2 diabetes mellitus without complications: Secondary | ICD-10-CM | POA: Insufficient documentation

## 2010-10-01 LAB — CBC
HCT: 43.8 % (ref 39.0–52.0)
Hemoglobin: 13.9 g/dL (ref 13.0–17.0)
MCH: 27.6 pg (ref 26.0–34.0)
MCV: 87.1 fL (ref 78.0–100.0)
RBC: 5.03 MIL/uL (ref 4.22–5.81)

## 2010-10-01 LAB — DIFFERENTIAL
Eosinophils Absolute: 0.1 10*3/uL (ref 0.0–0.7)
Lymphs Abs: 2.5 10*3/uL (ref 0.7–4.0)
Monocytes Absolute: 1 10*3/uL (ref 0.1–1.0)
Monocytes Relative: 7 % (ref 3–12)
Neutrophils Relative %: 77 % (ref 43–77)

## 2010-10-01 LAB — URINALYSIS, ROUTINE W REFLEX MICROSCOPIC
Ketones, ur: NEGATIVE mg/dL
Leukocytes, UA: NEGATIVE
Nitrite: NEGATIVE
Protein, ur: 100 mg/dL — AB
pH: 6 (ref 5.0–8.0)

## 2010-10-01 LAB — COMPREHENSIVE METABOLIC PANEL
Alkaline Phosphatase: 85 U/L (ref 39–117)
BUN: 40 mg/dL — ABNORMAL HIGH (ref 6–23)
Creatinine, Ser: 2.6 mg/dL — ABNORMAL HIGH (ref 0.50–1.35)
GFR calc Af Amer: 33 mL/min — ABNORMAL LOW (ref >60)
Glucose, Bld: 87 mg/dL (ref 70–99)
Potassium: 4.3 mEq/L (ref 3.5–5.1)
Total Bilirubin: 0.5 mg/dL (ref 0.3–1.2)
Total Protein: 6.8 g/dL (ref 6.0–8.3)

## 2010-10-01 MED ORDER — MECLIZINE HCL 50 MG PO TABS: 25.0000 mg | ORAL_TABLET | Freq: Three times a day (TID) | ORAL | Status: DC | PRN

## 2010-10-01 MED ORDER — HYDROCODONE-ACETAMINOPHEN 5-325 MG PO TABS: 1.0000 | ORAL_TABLET | ORAL | Status: AC | PRN

## 2010-10-01 NOTE — ED Notes (Signed)
Pt states every thing hurts today

## 2010-10-01 NOTE — Consult Note (Signed)
NAME:  ADDEN, HOLLENBACH            ACCOUNT NO.:  [id number]  MEDICAL RECORD NO.:  [id number]           PATIENT TYPE:  I  LOCATION:  [id number]                          FACILITY:  [facility]  PHYSICIAN:  Cristopher Estimable. Lattie Haw, MD, FACCDATE OF BIRTH:  [birth year]  DATE OF CONSULTATION:  05/09/2010 DATE OF DISCHARGE:                                CONSULTATION   The patient seen in the [facility] May 09, 2010, with Dr. Lattie Haw.  PRIMARY CARE DOCTOR:  Dr. [name].  CHIEF COMPLAINT:  Sinus tachycardia.  HISTORY OF PRESENT ILLNESS:  This is a 45 year old African American male patient who was admitted with abdominal pain and dyspnea.  He underwent endoscopy today which showed a nonerosive antral gastritis.  He has a history of chronic tachycardia with rates at 130-150, but has never seen Cardiology.  He says if he walks 5 stairs his heart raises and he becomes short of breath and has to sit down.  He has occasional sharp shooting chest pain but no tightness or pressure.  He is disabled secondary to severe debilitating gout.  ALLERGIES:  No known drug allergies.  MEDICATIONS: 1. Allopurinol. 2. Metoprolol 50 mg b.i.d. 3. Lasix 40 mg daily. 4. Lovenox 60 subcu daily.  PAST MEDICAL HISTORY:  Significant for hypertension, debilitating gout, obesity, stage III kidney disease, history of chronic tachycardia at rates of 130-150 never worked up, history of DVT last year treated with Coumadin for 6 months, history of venous insufficiency secondary to valvular incompetence.  SOCIAL HISTORY:  He is married.  He is disabled secondary to gout.  He has 2 biological children, 2 stepchildren.  He used to drink alcohol socially but quit 6 years ago.  FAMILY HISTORY:  His mother died of an MI at age 43.  Father died of lung cancer at 43.  Siblings are without heart disease.  REVIEW OF SYSTEMS:  CARDIOPULMONARY:  Please see HPI.  The patient has significant pain and swelling of his joint  secondary to debilitating gout and possible another source of arthritis.  PHYSICAL EXAMINATION:  GENERAL:  This is a pleasant 45 year old African American male in no acute distress. VITAL SIGNS:  Blood pressure 105/72, pulse 79, temperature 98. HEENT:  Head is normocephalic without signs of trauma.  Extraocular movements are intact.  Pupils are equal and reactive to light and accommodation.  Nose, mucosa is moist.  Throat is without erythema or exudate. NECK:  Supple without JVD, HJR, bruit, thyroid enlargement. LUNGS:  Decreased breath sounds but clear anterior, posterior and lateral. HEART:  Regular rate and rhythm at 80 beats per minute.  Normal S1 and S2.  Positive S4.  No murmur, rub, bruit, thrill or heave noted. ABDOMEN:  Soft without organomegaly, masses, lesions or abnormal tenderness. EXTREMITIES:  Trace of edema.  No cyanosis, clubbing. MUSCULOSKELETAL:  He does have significant gouty arthritic changes in his joints, especially on his hands.  RADIOLOGY:  He had normal VQ scan.  CT scan was okay.  Chest x-ray, moderately enlarged heart, no infiltrates.  A 2-D echo yesterday showed normal LV function, mild LVH, EF 55-60%, mildly dilated left atrium. EKGs, sinus tachycardia at  130-150 beats per minute.  Telemetry now normal sinus rhythm.  Hemoglobin 8.6, hematocrit 27, white count 22. Sodium 139, potassium 5.1, chloride 105, CO2 23, BUN 43, creatinine 2.4, TSH and free T4 normal.  IMPRESSION: 1. Chronic sinus tachycardia with heart rate 130-150, never worked up,     currently normal sinus rhythm. 2. Hypertension. 3. Stage III renal insufficiency. 4. History of deep vein thrombosis, last year treated with Coumadin     for 6 months. 5. Obesity. 6. Debilitating gout. 7. Anemia. 8. Nonerosive antral gastritis, on endoscopy today.  PLAN:  We will increase his metoprolol to 75 mg b.i.d. and continue to follow.     Ermalinda Barrios,  PA-C   ______________________________ Cristopher Estimable. Lattie Haw, MD, [facility]    ML/MEDQ  D:  05/09/2010  T:  05/10/2010  Job:  [id number]  Electronically Signed by Ermalinda Barrios PA-C on 09/18/2010 09:14:59 AM Electronically Signed by Jacqulyn Ducking MD [place] on 10/01/2010 08:44:44 AM

## 2010-10-01 NOTE — ED Provider Notes (Signed)
History   Chart scribed for No att. providers found by [facility]; the patient was seen in room APA07/APA07; this patient's care was started at 9:24 AM.    CSN: [id number] Arrival date & time: 10/01/2010  8:29 AM  Chief Complaint  Patient presents with  . Dizziness   HPI Jack Irwin is a 45 y.o. male who presents to the Emergency Department complaining of dizziness. Pt states he has felt dizzy/lightheaded and unsteady with walking for the past few days; sx are improving. No headache, recent head injury, change in vision, or syncope. Pt also c/o mild left lower abd pain and dysuria that have been present intermittently for the past few months but constant in the past 2-3 days. Pt is eating well; denies n/v/d, hematuria, urinary frequency or urgency. Pt has been seen multiple times previously in ED with similar and other complaints.    Past Medical History  Diagnosis Date  . Hypertension   . Renal insufficiency   . Diabetes mellitus   . Gout   . Chronic back pain     Past Surgical History  Procedure Date  . None     Family History  Problem Relation Age of Onset  . Diabetes Mother   . Hypertension Mother   . Heart failure Mother   . Hyperlipidemia Mother   . Cancer Father   . Diabetes Father   . Hypertension Father   . Hyperlipidemia Father     History  Substance Use Topics  . Smoking status: Never Smoker   . Smokeless tobacco: Never Used  . Alcohol Use: No   Previous Medications   AMITRIPTYLINE (ELAVIL) 25 MG TABLET    Take 25 mg by mouth at bedtime.     AMLODIPINE (NORVASC) 5 MG TABLET    Take 5 mg by mouth daily.     CALCIUM ACETATE (PHOSLO) 667 MG CAPSULE    Take 1,334 mg by mouth daily.    COLCHICINE 0.6 MG TABLET    Take 0.6 mg by mouth 2 (two) times daily.     DOXYCYCLINE (VIBRAMYCIN) 100 MG CAPSULE    Take 1 capsule (100 mg total) by mouth 2 (two) times daily.   FEBUXOSTAT (ULORIC) 40 MG TABLET    Take 80 mg by mouth daily.     METOPROLOL (LOPRESSOR) 50  MG TABLET    Take 50 mg by mouth 2 (two) times daily.     OXYCODONE-ACETAMINOPHEN (PERCOCET) 5-325 MG PER TABLET    Take 1 tablet by mouth daily as needed. For pain   PREDNISONE (DELTASONE) 20 MG TABLET    Take 20 mg by mouth daily as needed. For swelling or inflammation   VARDENAFIL (LEVITRA) 20 MG TABLET    Take 20 mg by mouth daily as needed.     VARDENAFIL HCL (STAXYN) 10 MG TBDP    Take 1 tablet by mouth once as needed. For intercourse     Allergies as of 10/01/2010  . (No Known Allergies)       Review of Systems 10 Systems reviewed and are negative for acute change except as noted in the HPI.  Physical Exam  BP 150/99  Pulse 76  Temp 98.2 F (36.8 C)  Resp 24  Ht 5\' 11"  (1.803 m)  Wt 299 lb (135.626 kg)  BMI 41.70 kg/m2  SpO2 100%  Physical Exam  Nursing note and vitals reviewed. Constitutional: He is oriented to person, place, and time. No distress.       Appearance  consistent with age of record; obese  HENT:  Head: Normocephalic and atraumatic.  Right Ear: External ear normal.  Left Ear: External ear normal.  Nose: Nose normal.  Mouth/Throat: Oropharynx is clear and moist.  Eyes: Conjunctivae are normal.  Neck: Neck supple.  Cardiovascular: Normal rate and regular rhythm.  Exam reveals no gallop and no friction rub.   No murmur heard. Pulmonary/Chest: Effort normal and breath sounds normal. He has no wheezes. He has no rhonchi. He has no rales. He exhibits no tenderness.  Abdominal: Soft. There is tenderness (mild suprapubic and LLQ).       pannus  Musculoskeletal: Normal range of motion.       Normal appearance of extremities  Neurological: He is alert and oriented to person, place, and time. No sensory deficit.  Skin: No rash noted.       Color normal  Psychiatric: He has a normal mood and affect.    ED Course  Procedures  OTHER DATA REVIEWED: Nursing notes and vital signs reviewed. Prior records reviewed.    LABS / RADIOLOGY: Results for orders  placed during the hospital encounter of 10/01/10  CBC      Component Value Range   WBC 15.6 (*) 4.0 - 10.5 (K/uL)   RBC 5.03  4.22 - 5.81 (MIL/uL)   Hemoglobin 13.9  13.0 - 17.0 (g/dL)   HCT 43.8  39.0 - 52.0 (%)   MCV 87.1  78.0 - 100.0 (fL)   MCH 27.6  26.0 - 34.0 (pg)   MCHC 31.7  30.0 - 36.0 (g/dL)   RDW 17.6 (*) 11.5 - 15.5 (%)   Platelets 158  150 - 400 (K/uL)  DIFFERENTIAL      Component Value Range   Neutrophils Relative 77  43 - 77 (%)   Neutro Abs 12.0 (*) 1.7 - 7.7 (K/uL)   Lymphocytes Relative 16  12 - 46 (%)   Lymphs Abs 2.5  0.7 - 4.0 (K/uL)   Monocytes Relative 7  3 - 12 (%)   Monocytes Absolute 1.0  0.1 - 1.0 (K/uL)   Eosinophils Relative 1  0 - 5 (%)   Eosinophils Absolute 0.1  0.0 - 0.7 (K/uL)   Basophils Relative 0  0 - 1 (%)   Basophils Absolute 0.0  0.0 - 0.1 (K/uL)  COMPREHENSIVE METABOLIC PANEL      Component Value Range   Sodium 139  135 - 145 (mEq/L)   Potassium 4.3  3.5 - 5.1 (mEq/L)   Chloride 103  96 - 112 (mEq/L)   CO2 27  19 - 32 (mEq/L)   Glucose, Bld 87  70 - 99 (mg/dL)   BUN 40 (*) 6 - 23 (mg/dL)   Creatinine, Ser 2.60 (*) 0.50 - 1.35 (mg/dL)   Calcium 9.2  8.4 - 10.5 (mg/dL)   Total Protein 6.8  6.0 - 8.3 (g/dL)   Albumin 3.0 (*) 3.5 - 5.2 (g/dL)   AST 17  0 - 37 (U/L)   ALT 17  0 - 53 (U/L)   Alkaline Phosphatase 85  39 - 117 (U/L)   Total Bilirubin 0.5  0.3 - 1.2 (mg/dL)   GFR calc non Af Amer 27 (*) >60 (mL/min)   GFR calc Af Amer 33 (*) >60 (mL/min)  URINALYSIS, ROUTINE W REFLEX MICROSCOPIC      Component Value Range   Color, Urine YELLOW  YELLOW    Appearance CLEAR  CLEAR    Specific Gravity, Urine 1.025  1.005 - 1.030  pH 6.0  5.0 - 8.0    Glucose, UA NEGATIVE  NEGATIVE (mg/dL)   Hgb urine dipstick TRACE (*) NEGATIVE    Bilirubin Urine NEGATIVE  NEGATIVE    Ketones, ur NEGATIVE  NEGATIVE (mg/dL)   Protein, ur 100 (*) NEGATIVE (mg/dL)   Urobilinogen, UA 0.2  0.0 - 1.0 (mg/dL)   Nitrite NEGATIVE  NEGATIVE    Leukocytes, UA  NEGATIVE  NEGATIVE   URINE MICROSCOPIC-ADD ON      Component Value Range   RBC / HPF 0-2  <3 (RBC/hpf)     ED COURSE: Patient is nontoxic we'll do screening tests  MDM: Mild elevation of white cells noted. Patient has no acute abdomen. Urine sample negative. Patient appears to be in his normal state of health. Diagnostic possibilities include early diverticulitis. Will return if worse. Dizziness has improved. Suspect minimal vertigo.  IMPRESSION: 1. Dizziness   2. Abdominal pain, left lower quadrant   3. Renal insufficiency      PLAN: discharge All results reviewed and discussed with pt, questions answered, pt agreeable with plan.   CONDITION ON DISCHARGE: stable   MEDS GIVEN IN ED: none  DISCHARGE MEDICATIONS: New Prescriptions   HYDROCODONE-ACETAMINOPHEN (NORCO) 5-325 MG PER TABLET    Take 1-2 tablets by mouth every 4 (four) hours as needed for pain.   MECLIZINE (ANTIVERT) 50 MG TABLET    Take 0.5 tablets (25 mg total) by mouth 3 (three) times daily as needed.     SCRIBE ATTESTATION: I personally performed the services described in this documentation, which was scribed in my presence. The recorded information has been reviewed and considered. No att. providers found       Nat Christen, MD 10/02/10 2150

## 2010-10-06 NOTE — ED Provider Notes (Signed)
Medical screening examination/treatment/procedure(s) were performed by non-physician practitioner and as supervising physician I was immediately available for consultation/collaboration.   Mervin Kung, MD 10/06/10 [phone]

## 2010-10-12 ENCOUNTER — Encounter (HOSPITAL_COMMUNITY): Payer: Self-pay | Admitting: Emergency Medicine

## 2010-10-12 ENCOUNTER — Emergency Department (HOSPITAL_COMMUNITY): Payer: Self-pay

## 2010-10-12 ENCOUNTER — Other Ambulatory Visit: Payer: Self-pay

## 2010-10-12 ENCOUNTER — Emergency Department (HOSPITAL_COMMUNITY)
Admission: EM | Admit: 2010-10-12 | Discharge: 2010-10-13 | Disposition: A | Discharge status: Home / Self Care | Payer: Self-pay | Attending: Emergency Medicine | Admitting: Emergency Medicine

## 2010-10-12 DIAGNOSIS — R300 Dysuria: Secondary | ICD-10-CM | POA: Insufficient documentation

## 2010-10-12 DIAGNOSIS — R079 Chest pain, unspecified: Secondary | ICD-10-CM | POA: Insufficient documentation

## 2010-10-12 DIAGNOSIS — I498 Other specified cardiac arrhythmias: Secondary | ICD-10-CM | POA: Insufficient documentation

## 2010-10-12 DIAGNOSIS — R42 Dizziness and giddiness: Secondary | ICD-10-CM | POA: Insufficient documentation

## 2010-10-12 DIAGNOSIS — M549 Dorsalgia, unspecified: Secondary | ICD-10-CM | POA: Insufficient documentation

## 2010-10-12 DIAGNOSIS — E119 Type 2 diabetes mellitus without complications: Secondary | ICD-10-CM | POA: Insufficient documentation

## 2010-10-12 DIAGNOSIS — R51 Headache: Secondary | ICD-10-CM | POA: Insufficient documentation

## 2010-10-12 DIAGNOSIS — I10 Essential (primary) hypertension: Secondary | ICD-10-CM | POA: Insufficient documentation

## 2010-10-12 LAB — URINE MICROSCOPIC-ADD ON

## 2010-10-12 LAB — DIFFERENTIAL
Basophils Absolute: 0 10*3/uL (ref 0.0–0.1)
Basophils Relative: 0 % (ref 0–1)
Neutro Abs: 11.8 10*3/uL — ABNORMAL HIGH (ref 1.7–7.7)
Neutrophils Relative %: 76 % (ref 43–77)

## 2010-10-12 LAB — CARDIAC PANEL(CRET KIN+CKTOT+MB+TROPI)
CK, MB: 3.1 ng/mL (ref 0.3–4.0)
Relative Index: 1.9 (ref 0.0–2.5)
Total CK: 165 U/L (ref 7–232)

## 2010-10-12 LAB — URINALYSIS, ROUTINE W REFLEX MICROSCOPIC
Nitrite: NEGATIVE
Specific Gravity, Urine: 1.025 (ref 1.005–1.030)
Urobilinogen, UA: 0.2 mg/dL (ref 0.0–1.0)
pH: 6 (ref 5.0–8.0)

## 2010-10-12 LAB — CBC
MCHC: 31.6 g/dL (ref 30.0–36.0)
Platelets: 178 10*3/uL (ref 150–400)
RDW: 17.7 % — ABNORMAL HIGH (ref 11.5–15.5)
WBC: 15.4 10*3/uL — ABNORMAL HIGH (ref 4.0–10.5)

## 2010-10-12 LAB — BASIC METABOLIC PANEL
Chloride: 101 mEq/L (ref 96–112)
Creatinine, Ser: 2.74 mg/dL — ABNORMAL HIGH (ref 0.50–1.35)
GFR calc Af Amer: 31 mL/min — ABNORMAL LOW (ref >60)
Potassium: 4.2 mEq/L (ref 3.5–5.1)
Sodium: 134 mEq/L — ABNORMAL LOW (ref 135–145)

## 2010-10-12 LAB — GLUCOSE, CAPILLARY: Glucose-Capillary: 117 mg/dL — ABNORMAL HIGH (ref 70–99)

## 2010-10-12 MED ORDER — MECLIZINE HCL 25 MG PO TABS: 25.0000 mg | ORAL_TABLET | Freq: Four times a day (QID) | ORAL | Status: AC | PRN

## 2010-10-12 MED ORDER — MECLIZINE HCL 12.5 MG PO TABS
25.0000 mg | ORAL_TABLET | Freq: Once | ORAL | Status: AC
  Administered 2010-10-12: 25 mg via ORAL
  Filled 2010-10-12: qty 2

## 2010-10-12 MED ORDER — HYDROMORPHONE HCL 2 MG/ML IJ SOLN
2.0000 mg | Freq: Once | INTRAMUSCULAR | Status: AC
  Administered 2010-10-12: 2 mg via INTRAMUSCULAR
  Filled 2010-10-12: qty 1

## 2010-10-12 MED ORDER — TECHNETIUM TO 99M ALBUMIN AGGREGATED
5.0000 | Freq: Once | INTRAVENOUS | Status: AC | PRN
  Administered 2010-10-12: 5.36 via INTRAVENOUS

## 2010-10-12 MED ORDER — HYDROCODONE-ACETAMINOPHEN 5-325 MG PO TABS: 1.0000 | ORAL_TABLET | ORAL | Status: AC | PRN

## 2010-10-12 MED ORDER — HYDROMORPHONE HCL 1 MG/ML IJ SOLN
1.0000 mg | Freq: Once | INTRAMUSCULAR | Status: AC
  Administered 2010-10-12: 1 mg via INTRAVENOUS
  Filled 2010-10-12: qty 1

## 2010-10-12 MED ORDER — XENON XE 133 GAS
10.0000 | GAS_FOR_INHALATION | Freq: Once | RESPIRATORY_TRACT | Status: AC | PRN
  Administered 2010-10-12: 12.8 via RESPIRATORY_TRACT

## 2010-10-12 NOTE — ED Provider Notes (Signed)
Scribed for Nat Christen, MD, the patient was seen in room APA18/APA18 . This chart was scribed by Glory Buff. This patient's care was started at 3:31 PM.   CSN: [id number] Arrival date & time: 10/12/2010  2:36 PM  Chief Complaint  Patient presents with  . Chest Pain  . Groin Pain  . Headache   HPI Pt seen at 15:30 Jack Irwin is a 45 y.o. male who presents to the Emergency Department complaining of dizziness and headache starting 2 days ago. Pt c/o associated eye pain. Denies cough or fever. Additionally PT c/o groin pain that is aggravated by walking and urinating.    Past Medical History  Diagnosis Date  . Hypertension   . Renal insufficiency   . Diabetes mellitus   . Gout   . Chronic back pain     Past Surgical History  Procedure Date  . None    MEDICATIONS:  Previous Medications   AMITRIPTYLINE (ELAVIL) 25 MG TABLET    Take 25 mg by mouth at bedtime.     AMLODIPINE (NORVASC) 5 MG TABLET    Take 5 mg by mouth daily.     CALCIUM ACETATE (PHOSLO) 667 MG CAPSULE    Take 1,334 mg by mouth daily.    COLCHICINE 0.6 MG TABLET    Take 0.6 mg by mouth 2 (two) times daily as needed.    FEBUXOSTAT (ULORIC) 40 MG TABLET    Take 80 mg by mouth daily.     METOPROLOL (LOPRESSOR) 50 MG TABLET    Take 50 mg by mouth 2 (two) times daily.     OXYCODONE-ACETAMINOPHEN (PERCOCET) 5-325 MG PER TABLET    Take 1 tablet by mouth daily as needed. For pain   PREDNISONE (DELTASONE) 20 MG TABLET    Take 20 mg by mouth daily as needed. For swelling or inflammation   VARDENAFIL (LEVITRA) 20 MG TABLET    Take 20 mg by mouth daily as needed.     VARDENAFIL HCL (STAXYN) 10 MG TBDP    Take 1 tablet by mouth once as needed. For intercourse     ALLERGIES:  Allergies as of 10/12/2010  . (No Known Allergies)    Family History  Problem Relation Age of Onset  . Diabetes Mother   . Hypertension Mother   . Heart failure Mother   . Hyperlipidemia Mother   . Cancer Father   . Diabetes Father   .  Hypertension Father   . Hyperlipidemia Father   Lives with Wife.   History  Substance Use Topics  . Smoking status: Never Smoker   . Smokeless tobacco: Never Used  . Alcohol Use: No    Review of Systems  Constitutional: Negative for fever.  Eyes: Positive for pain.  Genitourinary: Positive for dysuria.       Groin pain  Neurological: Positive for dizziness and headaches.  All other systems reviewed and are negative.    Physical Exam  BP 162/98  Pulse 113  Temp(Src) 98.6 F (37 C) (Oral)  Resp 20  Ht 5\' 11"  (1.803 m)  Wt 285 lb (129.275 kg)  BMI 39.75 kg/m2  SpO2 96%  Physical Exam  Nursing note and vitals reviewed. Constitutional: He is oriented to person, place, and time. He appears well-developed and well-nourished.       Pt appears obese.  HENT:  Head: Normocephalic and atraumatic.  Eyes: EOM are normal.  Neck: Normal range of motion.  Pulmonary/Chest: Effort normal.  Musculoskeletal: Normal range of  motion.  Neurological: He is alert and oriented to person, place, and time.  Skin: Skin is warm and dry.       Folds of groin erythematous, malodorous skin.   Psychiatric: He has a normal mood and affect.   Procedures  OTHER DATA REVIEWED: Nursing notes, vital signs records reviewed.  DIAGNOSTIC STUDIES: Oxygen Saturation is 96% on room air, normal by my interpretation.    Date: 10/12/2010  Rate: 116  Rhythm: sinus tachycardia  QRS Axis: normal  Intervals: normal  ST/T Wave abnormalities: normal  Conduction Disutrbances:none  Narrative Interpretation:   Old EKG Reviewed: changes noted  LABS: Results for orders placed during the hospital encounter of 10/12/10  GLUCOSE, CAPILLARY      Component Value Range   Glucose-Capillary 117 (*) 70 - 99 (mg/dL)   Comment 1 Notify RN    URINALYSIS, ROUTINE W REFLEX MICROSCOPIC      Component Value Range   Color, Urine YELLOW  YELLOW    Appearance CLEAR  CLEAR    Specific Gravity, Urine 1.025  1.005 - 1.030      pH 6.0  5.0 - 8.0    Glucose, UA NEGATIVE  NEGATIVE (mg/dL)   Hgb urine dipstick TRACE (*) NEGATIVE    Bilirubin Urine NEGATIVE  NEGATIVE    Ketones, ur NEGATIVE  NEGATIVE (mg/dL)   Protein, ur 100 (*) NEGATIVE (mg/dL)   Urobilinogen, UA 0.2  0.0 - 1.0 (mg/dL)   Nitrite NEGATIVE  NEGATIVE    Leukocytes, UA NEGATIVE  NEGATIVE   URINE MICROSCOPIC-ADD ON      Component Value Range   WBC, UA 0-2  <3 (WBC/hpf)   RBC / HPF 0-2  <3 (RBC/hpf)   Dg Chest Portable 1 View  10/12/2010  *RADIOLOGY REPORT*  Clinical Data: Chest pain  PORTABLE CHEST - 1 VIEW  Comparison: 08/21/2010  Findings: Heart size is normal.  Mediastinal shadows are normal. The vascularity is normal.  Lungs are clear.  No effusions.  No bony abnormalities.  IMPRESSION: No active disease  Original Report Authenticated By: Jules Schick, M.D.   ED COURSE / COORDINATION OF CARE: 3:35 EDP discussed diagnostic possibilities with PT and ordered the following:  Orders Placed This Encounter  Procedures  . DG Chest Portable 1 View  . Glucose, capillary  . Urinalysis, Routine w reflex microscopic  . Urine microscopic-add on  . CBC  . Differential  . Basic metabolic panel  . Cardiac panel(cret kin+cktot+mb+tropi)  . D-dimer, quantitative  . Vital signs  . ED EKG    MDM:  No neurological deficits. Will run Urine. Will treat pain and dizziness. Prescribe ointment for jock rash.   Unexplained tachycardia noted. Will check Cardiac panel, ECG, Chest Xray and D-dimer. VQ scan negative for PE IMPRESSION: Diagnoses that have been ruled out:  Diagnoses that are still under consideration:  Final diagnoses:    MEDICATIONS GIVEN IN THE E.D.  Medications  meclizine (ANTIVERT) tablet 25 mg (25 mg Oral Given 10/12/10 1547)  HYDROmorphone (DILAUDID) injection 2 mg (2 mg Intramuscular Given 10/12/10 1545)    DISCHARGE MEDICATIONS: New Prescriptions   No medications on file    SCRIBE ATTESTATION: I personally performed the services  described in this documentation, which was scribed in my presence. The recorded information has been reviewed and considered. No att. providers found       Nat Christen, MD 10/14/10 2350

## 2010-10-12 NOTE — ED Notes (Signed)
Someone is on their way to do scan.

## 2010-10-12 NOTE — ED Notes (Signed)
Pt is on their way for vq scan

## 2010-10-12 NOTE — ED Notes (Signed)
Pt has multiple complaints. States he came to ER for groin pain but states he has been having right sided chest pain x 2 days. Also wants to be treated for headache.

## 2010-10-12 NOTE — ED Notes (Signed)
Pt alert and oriented.  EDP assessed pt prior to nursing staff, see epd's assessment.

## 2010-10-13 MED ORDER — CLOTRIMAZOLE-BETAMETHASONE 1-0.05 % EX CREA: TOPICAL_CREAM | CUTANEOUS | Status: DC

## 2010-10-17 ENCOUNTER — Encounter (HOSPITAL_COMMUNITY): Payer: Self-pay | Admitting: *Deleted

## 2010-10-17 ENCOUNTER — Emergency Department (HOSPITAL_COMMUNITY)
Admission: EM | Admit: 2010-10-17 | Discharge: 2010-10-17 | Disposition: A | Discharge status: Home / Self Care | Payer: Medicaid Other | Attending: Emergency Medicine | Admitting: Emergency Medicine

## 2010-10-17 DIAGNOSIS — K089 Disorder of teeth and supporting structures, unspecified: Secondary | ICD-10-CM | POA: Insufficient documentation

## 2010-10-17 DIAGNOSIS — K029 Dental caries, unspecified: Secondary | ICD-10-CM | POA: Insufficient documentation

## 2010-10-17 DIAGNOSIS — I10 Essential (primary) hypertension: Secondary | ICD-10-CM | POA: Insufficient documentation

## 2010-10-17 DIAGNOSIS — K0889 Other specified disorders of teeth and supporting structures: Secondary | ICD-10-CM

## 2010-10-17 DIAGNOSIS — M549 Dorsalgia, unspecified: Secondary | ICD-10-CM | POA: Insufficient documentation

## 2010-10-17 DIAGNOSIS — E119 Type 2 diabetes mellitus without complications: Secondary | ICD-10-CM | POA: Insufficient documentation

## 2010-10-17 MED ORDER — PENICILLIN V POTASSIUM 500 MG PO TABS: 500.0000 mg | ORAL_TABLET | Freq: Four times a day (QID) | ORAL | Status: AC

## 2010-10-17 MED ORDER — HYDROCODONE-ACETAMINOPHEN 5-325 MG PO TABS
1.0000 | ORAL_TABLET | Freq: Once | ORAL | Status: AC
  Administered 2010-10-17: 1 via ORAL
  Filled 2010-10-17: qty 1

## 2010-10-17 MED ORDER — PENICILLIN V POTASSIUM 250 MG PO TABS
500.0000 mg | ORAL_TABLET | Freq: Once | ORAL | Status: AC
  Administered 2010-10-17: 500 mg via ORAL
  Filled 2010-10-17: qty 2

## 2010-10-17 MED ORDER — IBUPROFEN 800 MG PO TABS
800.0000 mg | ORAL_TABLET | Freq: Once | ORAL | Status: AC
  Administered 2010-10-17: 800 mg via ORAL
  Filled 2010-10-17: qty 1

## 2010-10-17 MED ORDER — HYDROCODONE-ACETAMINOPHEN 5-325 MG PO TABS: 1.0000 | ORAL_TABLET | Freq: Four times a day (QID) | ORAL | Status: AC | PRN

## 2010-10-17 NOTE — ED Provider Notes (Signed)
History     CSN: [id number] Arrival date & time: 10/17/2010  9:09 PM   Chief Complaint  Patient presents with  . Dental Pain     (Include location/radiation/quality/duration/timing/severity/associated sxs/prior treatment) HPI Comments: States tooth fell out a little while ago while eating.  Patient is a 45 y.o. male presenting with tooth pain. The history is provided by the patient. No language interpreter was used.  Dental PainThe primary symptoms include mouth pain. Primary symptoms do not include dental injury, oral bleeding or fever. The symptoms began less than 1 hour ago. The symptoms are unchanged. The symptoms are new.     Past Medical History  Diagnosis Date  . Hypertension   . Renal insufficiency   . Diabetes mellitus   . Gout   . Chronic back pain      Past Surgical History  Procedure Date  . None     Family History  Problem Relation Age of Onset  . Diabetes Mother   . Hypertension Mother   . Heart failure Mother   . Hyperlipidemia Mother   . Cancer Father   . Diabetes Father   . Hypertension Father   . Hyperlipidemia Father     History  Substance Use Topics  . Smoking status: Never Smoker   . Smokeless tobacco: Never Used  . Alcohol Use: No      Review of Systems  Constitutional: Negative for fever.  HENT: Positive for dental problem.   All other systems reviewed and are negative.    Allergies  Review of patient's allergies indicates no known allergies.  Home Medications   Current Outpatient Rx  Name Route Sig Dispense Refill  . AMITRIPTYLINE HCL 25 MG PO TABS Oral Take 25 mg by mouth at bedtime.      [name] AMLODIPINE BESYLATE 5 MG PO TABS Oral Take 5 mg by mouth daily.      [name] CALCIUM ACETATE 667 MG PO CAPS Oral Take 1,334 mg by mouth daily.     [name] CLOTRIMAZOLE-BETAMETHASONE 1-0.05 % EX CREA  Apply to affected area 2 times daily prn 30 g 0  . COLCHICINE 0.6 MG PO TABS Oral Take 0.6 mg by mouth 2 (two) times daily as needed.     .  FEBUXOSTAT 40 MG PO TABS Oral Take 80 mg by mouth daily.      [name] HYDROCODONE-ACETAMINOPHEN 5-325 MG PO TABS Oral Take 1-2 tablets by mouth every 4 (four) hours as needed for pain. 15 tablet 0  . MECLIZINE HCL 25 MG PO TABS Oral Take 1 tablet (25 mg total) by mouth every 6 (six) hours as needed for dizziness. 15 tablet 0  . METOPROLOL TARTRATE 50 MG PO TABS Oral Take 50 mg by mouth 2 (two) times daily.      . OXYCODONE-ACETAMINOPHEN 5-325 MG PO TABS Oral Take 1 tablet by mouth daily as needed. For pain    . PREDNISONE 20 MG PO TABS Oral Take 20 mg by mouth daily as needed. For swelling or inflammation    . VARDENAFIL HCL 20 MG PO TABS Oral Take 20 mg by mouth daily as needed.      [name] VARDENAFIL HCL 10 MG PO TBDP Oral Take 1 tablet by mouth once as needed. For intercourse      Physical Exam    BP 125/91  Pulse 116  Temp(Src) 98.9 F (37.2 C) (Oral)  Resp 20  Wt 285 lb (129.275 kg)  SpO2 98%  Physical Exam  Nursing note and  vitals reviewed. Constitutional: He is oriented to person, place, and time. Vital signs are normal. He appears well-developed and well-nourished. No distress.  HENT:  Head: Normocephalic and atraumatic.  Right Ear: External ear normal.  Left Ear: External ear normal.  Nose: Nose normal.  Mouth/Throat: Dental caries present. No dental abscesses or uvula swelling. No oropharyngeal exudate.    Eyes: Conjunctivae and EOM are normal. Pupils are equal, round, and reactive to light. Right eye exhibits no discharge. Left eye exhibits no discharge. No scleral icterus.  Neck: Normal range of motion. Neck supple. No JVD present. No tracheal deviation present. No thyromegaly present.  Cardiovascular: Normal rate, regular rhythm, normal heart sounds, intact distal pulses and normal pulses.  Exam reveals no gallop and no friction rub.   No murmur heard. Pulmonary/Chest: Effort normal and breath sounds normal. No stridor. No respiratory distress. He has no wheezes. He has no rales.  He exhibits no tenderness.  Abdominal: Soft. Normal appearance and bowel sounds are normal. He exhibits no distension and no mass. There is no tenderness. There is no rebound and no guarding.  Musculoskeletal: Normal range of motion. He exhibits no edema and no tenderness.  Lymphadenopathy:    He has no cervical adenopathy.  Neurological: He is alert and oriented to person, place, and time. He has normal reflexes. No cranial nerve deficit. Coordination normal. GCS eye subscore is 4. GCS verbal subscore is 5. GCS motor subscore is 6.  Skin: Skin is warm and dry. No rash noted. He is not diaphoretic.  Psychiatric: He has a normal mood and affect. His speech is normal and behavior is normal. Judgment and thought content normal. Cognition and memory are normal.    ED Course  Procedures  Results for orders placed during the hospital encounter of 10/12/10  GLUCOSE, CAPILLARY      Component Value Range   Glucose-Capillary 117 (*) 70 - 99 (mg/dL)   Comment 1 Notify RN    URINALYSIS, ROUTINE W REFLEX MICROSCOPIC      Component Value Range   Color, Urine YELLOW  YELLOW    Appearance CLEAR  CLEAR    Specific Gravity, Urine 1.025  1.005 - 1.030    pH 6.0  5.0 - 8.0    Glucose, UA NEGATIVE  NEGATIVE (mg/dL)   Hgb urine dipstick TRACE (*) NEGATIVE    Bilirubin Urine NEGATIVE  NEGATIVE    Ketones, ur NEGATIVE  NEGATIVE (mg/dL)   Protein, ur 100 (*) NEGATIVE (mg/dL)   Urobilinogen, UA 0.2  0.0 - 1.0 (mg/dL)   Nitrite NEGATIVE  NEGATIVE    Leukocytes, UA NEGATIVE  NEGATIVE   URINE MICROSCOPIC-ADD ON      Component Value Range   WBC, UA 0-2  <3 (WBC/hpf)   RBC / HPF 0-2  <3 (RBC/hpf)  CBC      Component Value Range   WBC 15.4 (*) 4.0 - 10.5 (K/uL)   RBC 4.64  4.22 - 5.81 (MIL/uL)   Hemoglobin 12.8 (*) 13.0 - 17.0 (g/dL)   HCT 40.5  39.0 - 52.0 (%)   MCV 87.3  78.0 - 100.0 (fL)   MCH 27.6  26.0 - 34.0 (pg)   MCHC 31.6  30.0 - 36.0 (g/dL)   RDW 17.7 (*) 11.5 - 15.5 (%)   Platelets 178  150  - 400 (K/uL)  DIFFERENTIAL      Component Value Range   Neutrophils Relative 76  43 - 77 (%)   Neutro Abs 11.8 (*) 1.7 -  7.7 (K/uL)   Lymphocytes Relative 15  12 - 46 (%)   Lymphs Abs 2.3  0.7 - 4.0 (K/uL)   Monocytes Relative 8  3 - 12 (%)   Monocytes Absolute 1.2 (*) 0.1 - 1.0 (K/uL)   Eosinophils Relative 1  0 - 5 (%)   Eosinophils Absolute 0.1  0.0 - 0.7 (K/uL)   Basophils Relative 0  0 - 1 (%)   Basophils Absolute 0.0  0.0 - 0.1 (K/uL)  BASIC METABOLIC PANEL      Component Value Range   Sodium 134 (*) 135 - 145 (mEq/L)   Potassium 4.2  3.5 - 5.1 (mEq/L)   Chloride 101  96 - 112 (mEq/L)   CO2 23  19 - 32 (mEq/L)   Glucose, Bld 216 (*) 70 - 99 (mg/dL)   BUN 33 (*) 6 - 23 (mg/dL)   Creatinine, Ser 2.74 (*) 0.50 - 1.35 (mg/dL)   Calcium 8.9  8.4 - 10.5 (mg/dL)   GFR calc non Af Amer 25 (*) >60 (mL/min)   GFR calc Af Amer 31 (*) >60 (mL/min)  CARDIAC PANEL(CRET KIN+CKTOT+MB+TROPI)      Component Value Range   Total CK 165  7 - 232 (U/L)   CK, MB 3.1  0.3 - 4.0 (ng/mL)   Troponin I <0.30  <0.30 (ng/mL)   Relative Index 1.9  0.0 - 2.5   D-DIMER, QUANTITATIVE      Component Value Range   D-Dimer, Quant 2.89 (*) 0.00 - 0.48 (ug/mL-FEU)   Nm Pulmonary Per & Vent  10/12/2010  *RADIOLOGY REPORT*  Clinical Data:  Chest pain and shortness of breath; tachycardia. Dizziness.  NUCLEAR MEDICINE VENTILATION - PERFUSION LUNG SCAN  Technique:  Single-breath inhalation, equilibrium, and wash-out phase ventilation images were obtained using Xe-133 gas.  Perfusion images were obtained in multiple projections after intravenous injection of Tc-60m MAA.  Radiopharmaceuticals:  12.8 mCi Xe-133 gas and 5.36 mCi Tc-62m MAA.  Comparison:  V/Q scan performed 05/08/2010, and chest radiograph performed earlier today at 07:00 p.m.  Findings: Ventilation images demonstrate normal accumulation of activity within both lungs on single breath inhalation.  The equilibrium images are grossly unremarkable in  appearance.  On washout, there is suggestion of very mild right-sided airspace trapping.  Perfusion images demonstrate slight heterogeneity of perfusion within the left lung, without significant focal perfusion defect. An apparent small defect of perfusion at the right lung apex is thought to reflect overlying structures.  No significant focal segmental defect is identified within either lung.  Corresponding chest radiograph demonstrates no focal abnormality.  IMPRESSION: Near-normal V/Q scan; suggestion of very mild right-sided airspace trapping.  Original Report Authenticated By: Santa Lighter, M.D.   Dg Chest Portable 1 View  10/12/2010  *RADIOLOGY REPORT*  Clinical Data: Chest pain  PORTABLE CHEST - 1 VIEW  Comparison: 08/21/2010  Findings: Heart size is normal.  Mediastinal shadows are normal. The vascularity is normal.  Lungs are clear.  No effusions.  No bony abnormalities.  IMPRESSION: No active disease  Original Report Authenticated By: Jules Schick, M.D.     No diagnosis found.   [place], PA 10/17/10 2135

## 2010-10-17 NOTE — ED Notes (Signed)
Pt asking for levetra  For penile dysfunction, just needs some samples.  Timmie Foerster PA-C notified and spoke to pt in regards to his request.

## 2010-10-17 NOTE — ED Notes (Signed)
Pt was eating and tooth broke.  Headache

## 2010-10-17 NOTE — ED Provider Notes (Signed)
Medical screening examination/treatment/procedure(s) were performed by non-physician practitioner and as supervising physician I was immediately available for consultation/collaboration.   Alfonzo Feller, DO 10/17/10 2236

## 2010-10-17 NOTE — ED Notes (Signed)
Pt states was eating about 15 minutes ago when upper molar" fell out". Pt has tooth in napkin, is black and small.

## 2010-10-21 NOTE — Progress Notes (Signed)
Evaluation and management procedures were performed by the PA/NP under my supervision/collaboration.

## 2010-10-21 NOTE — ED Provider Notes (Signed)
Evaluation and management procedures were performed by the PA/NP under my supervision/collaboration.    Charlena Cross, MD 10/21/10 [phone]

## 2010-10-28 LAB — CBC
HCT: 39.3
MCV: 84.3
Platelets: 169
RDW: 20.4 — ABNORMAL HIGH

## 2010-10-28 LAB — BASIC METABOLIC PANEL
BUN: 43 — ABNORMAL HIGH
GFR calc non Af Amer: 43 — ABNORMAL LOW
Glucose, Bld: 112 — ABNORMAL HIGH
Potassium: 4.8

## 2010-10-28 LAB — DIFFERENTIAL
Basophils Absolute: 0.1
Eosinophils Absolute: 0
Eosinophils Relative: 0
Lymphocytes Relative: 7 — ABNORMAL LOW

## 2010-11-04 LAB — DIFFERENTIAL
Eosinophils Absolute: 0
Eosinophils Relative: 0
Lymphocytes Relative: 4 — ABNORMAL LOW
Lymphs Abs: 0.7
Monocytes Absolute: 0.2
Neutro Abs: 18.2 — ABNORMAL HIGH

## 2010-11-04 LAB — CULTURE, ROUTINE-ABSCESS: Gram Stain: NONE SEEN

## 2010-11-04 LAB — BASIC METABOLIC PANEL
Calcium: 9.3
Creatinine, Ser: 1.75 — ABNORMAL HIGH
GFR calc Af Amer: 52 — ABNORMAL LOW
GFR calc non Af Amer: 43 — ABNORMAL LOW

## 2010-11-04 LAB — CBC
RBC: 4.58
WBC: 19.3 — ABNORMAL HIGH

## 2010-11-06 ENCOUNTER — Encounter (HOSPITAL_COMMUNITY): Payer: Self-pay | Admitting: Emergency Medicine

## 2010-11-06 ENCOUNTER — Emergency Department (HOSPITAL_COMMUNITY)
Admission: EM | Admit: 2010-11-06 | Discharge: 2010-11-06 | Disposition: A | Discharge status: Home / Self Care | Payer: Medicaid Other | Attending: Emergency Medicine | Admitting: Emergency Medicine

## 2010-11-06 DIAGNOSIS — Z79899 Other long term (current) drug therapy: Secondary | ICD-10-CM | POA: Insufficient documentation

## 2010-11-06 DIAGNOSIS — R109 Unspecified abdominal pain: Secondary | ICD-10-CM | POA: Insufficient documentation

## 2010-11-06 DIAGNOSIS — G8929 Other chronic pain: Secondary | ICD-10-CM | POA: Insufficient documentation

## 2010-11-06 DIAGNOSIS — M545 Low back pain, unspecified: Secondary | ICD-10-CM | POA: Insufficient documentation

## 2010-11-06 LAB — URINALYSIS, ROUTINE W REFLEX MICROSCOPIC
Ketones, ur: NEGATIVE mg/dL
Protein, ur: 100 mg/dL — AB
Urobilinogen, UA: 0.2 mg/dL (ref 0.0–1.0)

## 2010-11-06 LAB — URINE MICROSCOPIC-ADD ON

## 2010-11-06 MED ORDER — HYDROMORPHONE HCL 2 MG/ML IJ SOLN
2.0000 mg | Freq: Once | INTRAMUSCULAR | Status: AC
  Administered 2010-11-06: 2 mg via INTRAMUSCULAR
  Filled 2010-11-06: qty 1

## 2010-11-06 MED ORDER — OXYCODONE-ACETAMINOPHEN 5-325 MG PO TABS: 1.0000 | ORAL_TABLET | Freq: Four times a day (QID) | ORAL | Status: AC | PRN

## 2010-11-06 NOTE — ED Provider Notes (Signed)
History     CSN: [id number] Arrival date & time: 11/06/2010  5:28 PM  Chief Complaint  Patient presents with  . Back Pain  . Abdominal Pain    (Consider location/radiation/quality/duration/timing/severity/associated sxs/prior treatment) HPI Pt with history of chronic back pain reports he was at his kidney doctor's office today and told he had a worsening in his renal function. He was concerned because of the back pain his has been having for the last week and that it might be related to his worsening kidney function. His pain is L lower back, worse with movement. He is out of his pain medications at home. No new injuries. Past Medical History  Diagnosis Date  . Hypertension   . Renal insufficiency   . Diabetes mellitus   . Gout   . Chronic back pain     Past Surgical History  Procedure Date  . None     Family History  Problem Relation Age of Onset  . Diabetes Mother   . Hypertension Mother   . Heart failure Mother   . Hyperlipidemia Mother   . Cancer Father   . Diabetes Father   . Hypertension Father   . Hyperlipidemia Father     History  Substance Use Topics  . Smoking status: Never Smoker   . Smokeless tobacco: Never Used  . Alcohol Use: No      Review of Systems All other systems reviewed and are negative except as noted in HPI.   Allergies  Review of patient's allergies indicates no known allergies.  Home Medications   Current Outpatient Rx  Name Route Sig Dispense Refill  . AMITRIPTYLINE HCL 25 MG PO TABS Oral Take 25 mg by mouth at bedtime.      [name] AMLODIPINE BESYLATE 5 MG PO TABS Oral Take 5 mg by mouth daily.      [name] CALCIUM ACETATE 667 MG PO CAPS Oral Take 1,334 mg by mouth daily.     . COLCHICINE 0.6 MG PO TABS Oral Take 0.6 mg by mouth 2 (two) times daily as needed. For gout    . FEBUXOSTAT 40 MG PO TABS Oral Take 80 mg by mouth every other day.     [name] METOPROLOL TARTRATE 50 MG PO TABS Oral Take 50 mg by mouth 2 (two) times daily.      .  OXYCODONE-ACETAMINOPHEN 5-325 MG PO TABS Oral Take 1 tablet by mouth daily as needed. For pain    . VARDENAFIL HCL 10 MG PO TBDP Oral Take 1 tablet by mouth once as needed. For intercourse    . CLOTRIMAZOLE-BETAMETHASONE 1-0.05 % EX CREA  Apply to affected area 2 times daily prn 30 g 0  . PREDNISONE 20 MG PO TABS Oral Take 20 mg by mouth daily as needed. For swelling or inflammation    . VARDENAFIL HCL 20 MG PO TABS Oral Take 20 mg by mouth daily as needed. For sexual intercourse      BP 118/76  Pulse 116  Temp(Src) 98.6 F (37 C) (Oral)  Resp 20  Ht 5\' 11"  (1.803 m)  Wt 310 lb (140.615 kg)  BMI 43.24 kg/m2  SpO2 96%  Physical Exam  Nursing note and vitals reviewed. Constitutional: He is oriented to person, place, and time. He appears well-developed and well-nourished.       Morbidly obese  HENT:  Head: Normocephalic and atraumatic.  Eyes: EOM are normal. Pupils are equal, round, and reactive to light.  Neck: Normal range of motion. Neck  supple.  Cardiovascular: Normal rate, normal heart sounds and intact distal pulses.   Pulmonary/Chest: Effort normal and breath sounds normal.  Abdominal: Bowel sounds are normal. He exhibits no distension. There is no tenderness.  Musculoskeletal: Normal range of motion. He exhibits tenderness (tender in L lumbar spine, no CVA tenderness). He exhibits no edema.  Neurological: He is alert and oriented to person, place, and time. He has normal strength. No cranial nerve deficit or sensory deficit.  Skin: Skin is warm and dry. No rash noted.  Psychiatric: He has a normal mood and affect.    ED Course  Procedures (including critical care time)  Labs Reviewed  URINALYSIS, ROUTINE W REFLEX MICROSCOPIC - Abnormal; Notable for the following:    Hgb urine dipstick TRACE (*)    Protein, ur 100 (*)    All other components within normal limits  URINE MICROSCOPIC-ADD ON          MDM  Advised patient that worsening renal function is not  typically a painful condition, he states he was told the same thing by the nephrologist. He has chronic pain and a negative UA. Advised to see PCP for long term pain control.         Charles B. Karle Starch, MD 11/06/10 1946

## 2010-11-06 NOTE — ED Notes (Signed)
Pt c/o lower back pain and lower abd pain x 1 week.

## 2010-11-07 LAB — VIRUS CULTURE: Preliminary Culture: NEGATIVE

## 2010-11-07 IMAGING — CR DG FOOT COMPLETE 3+V*L*
3 series · 3 of 3 positions shown · non-contrast
Comparison: 03/10/2009

CLINICAL DATA: Toe pain

LEFT FOOT - COMPLETE 3+ VIEW

[view not recorded (1 of 3)]
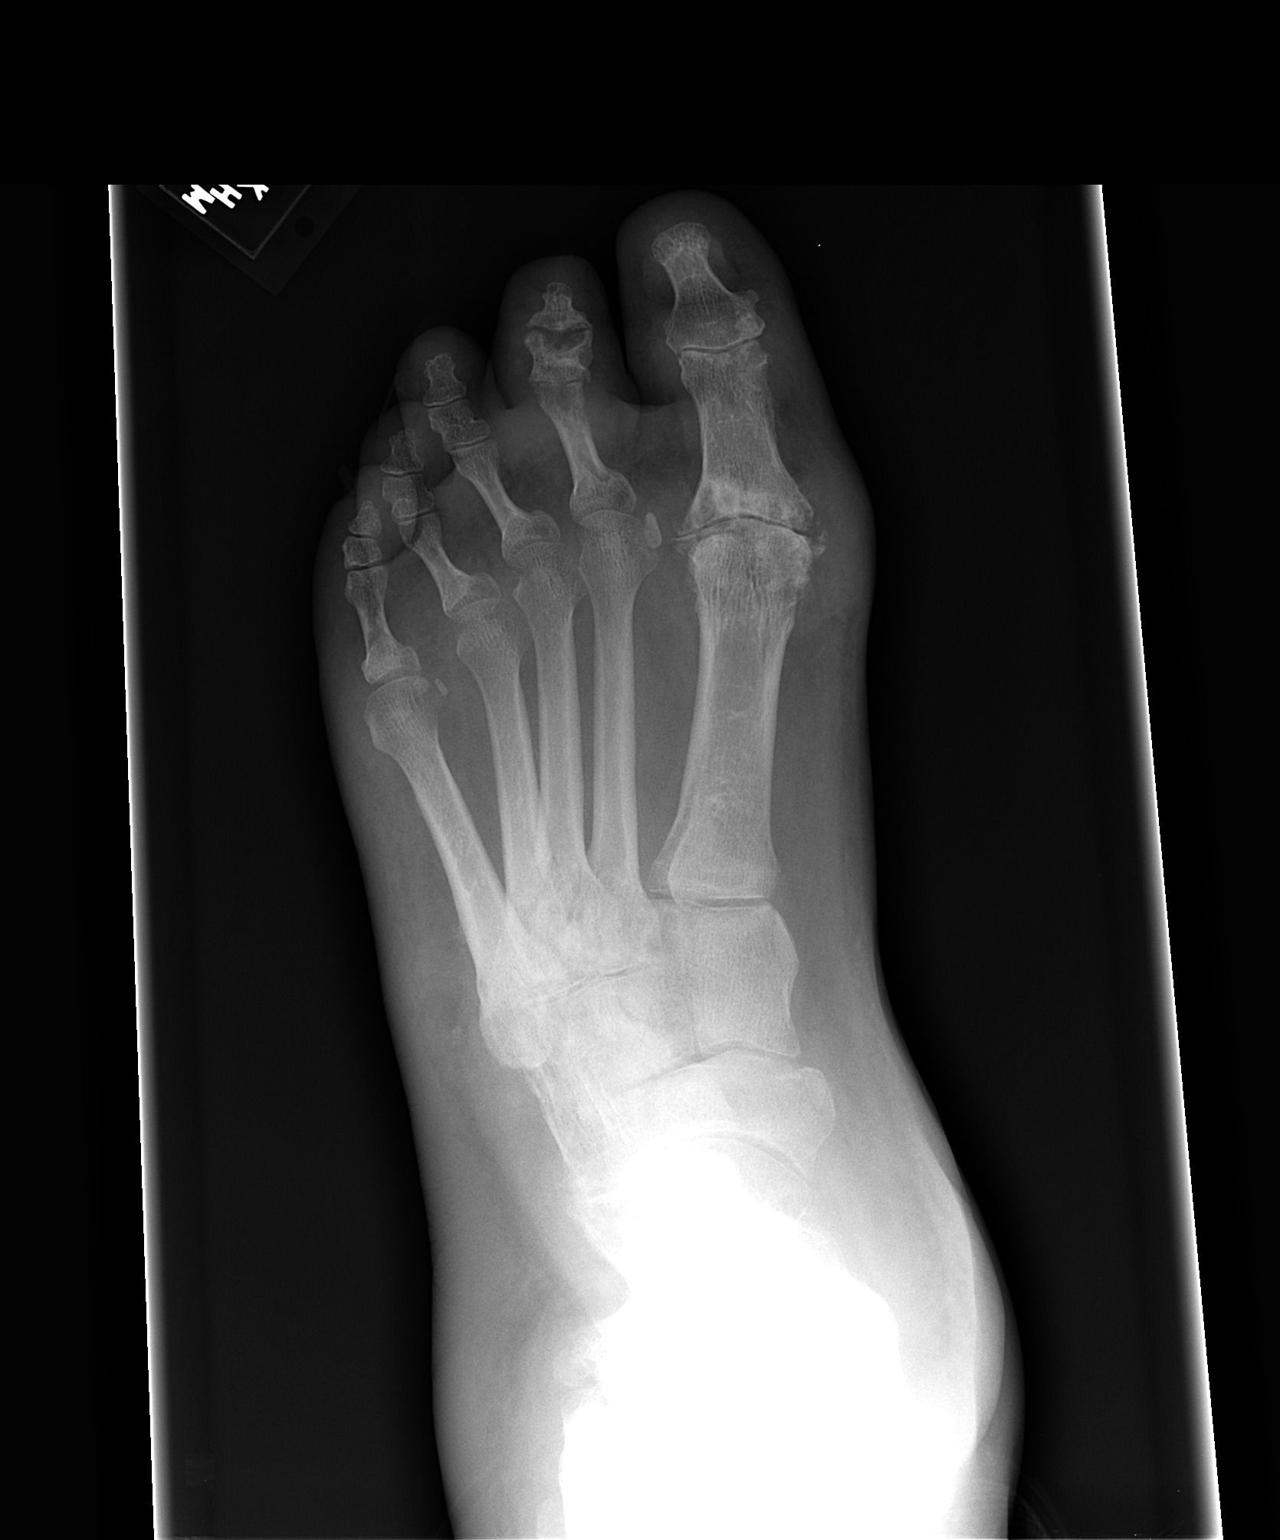

[view not recorded (2 of 3)]
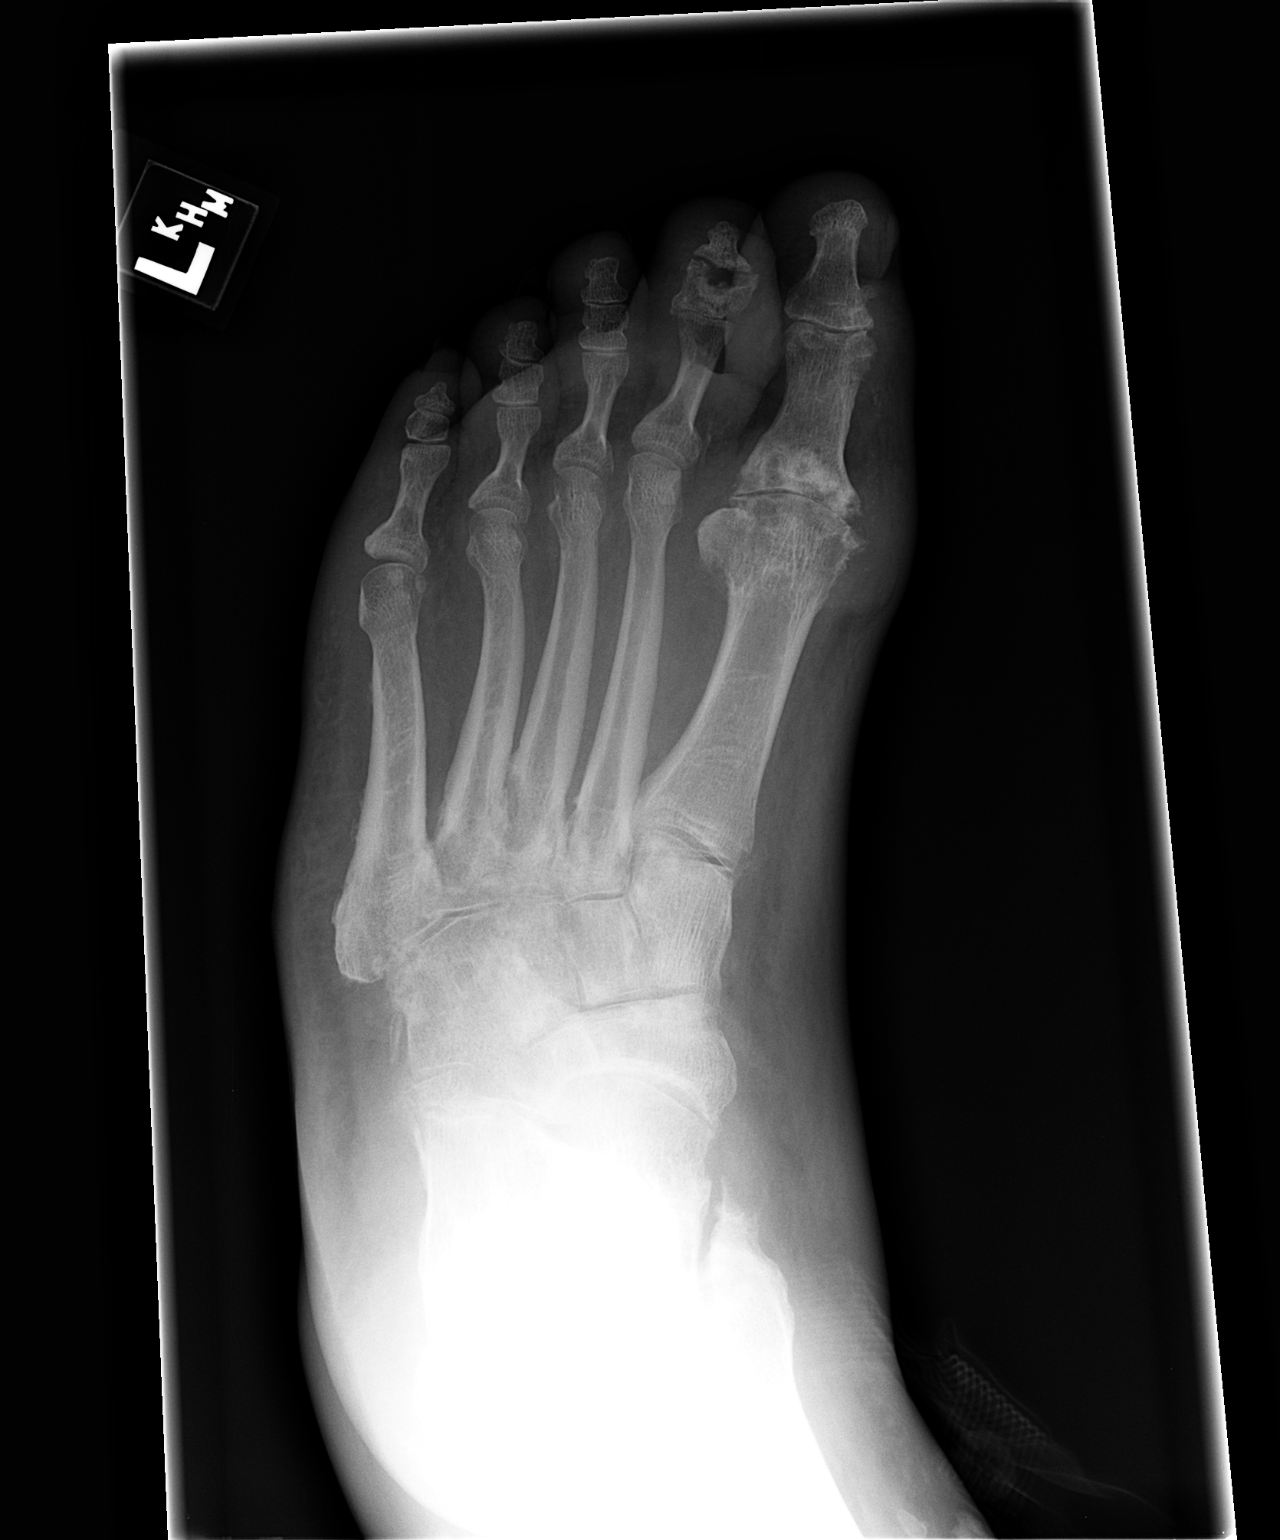

[view not recorded (3 of 3)]
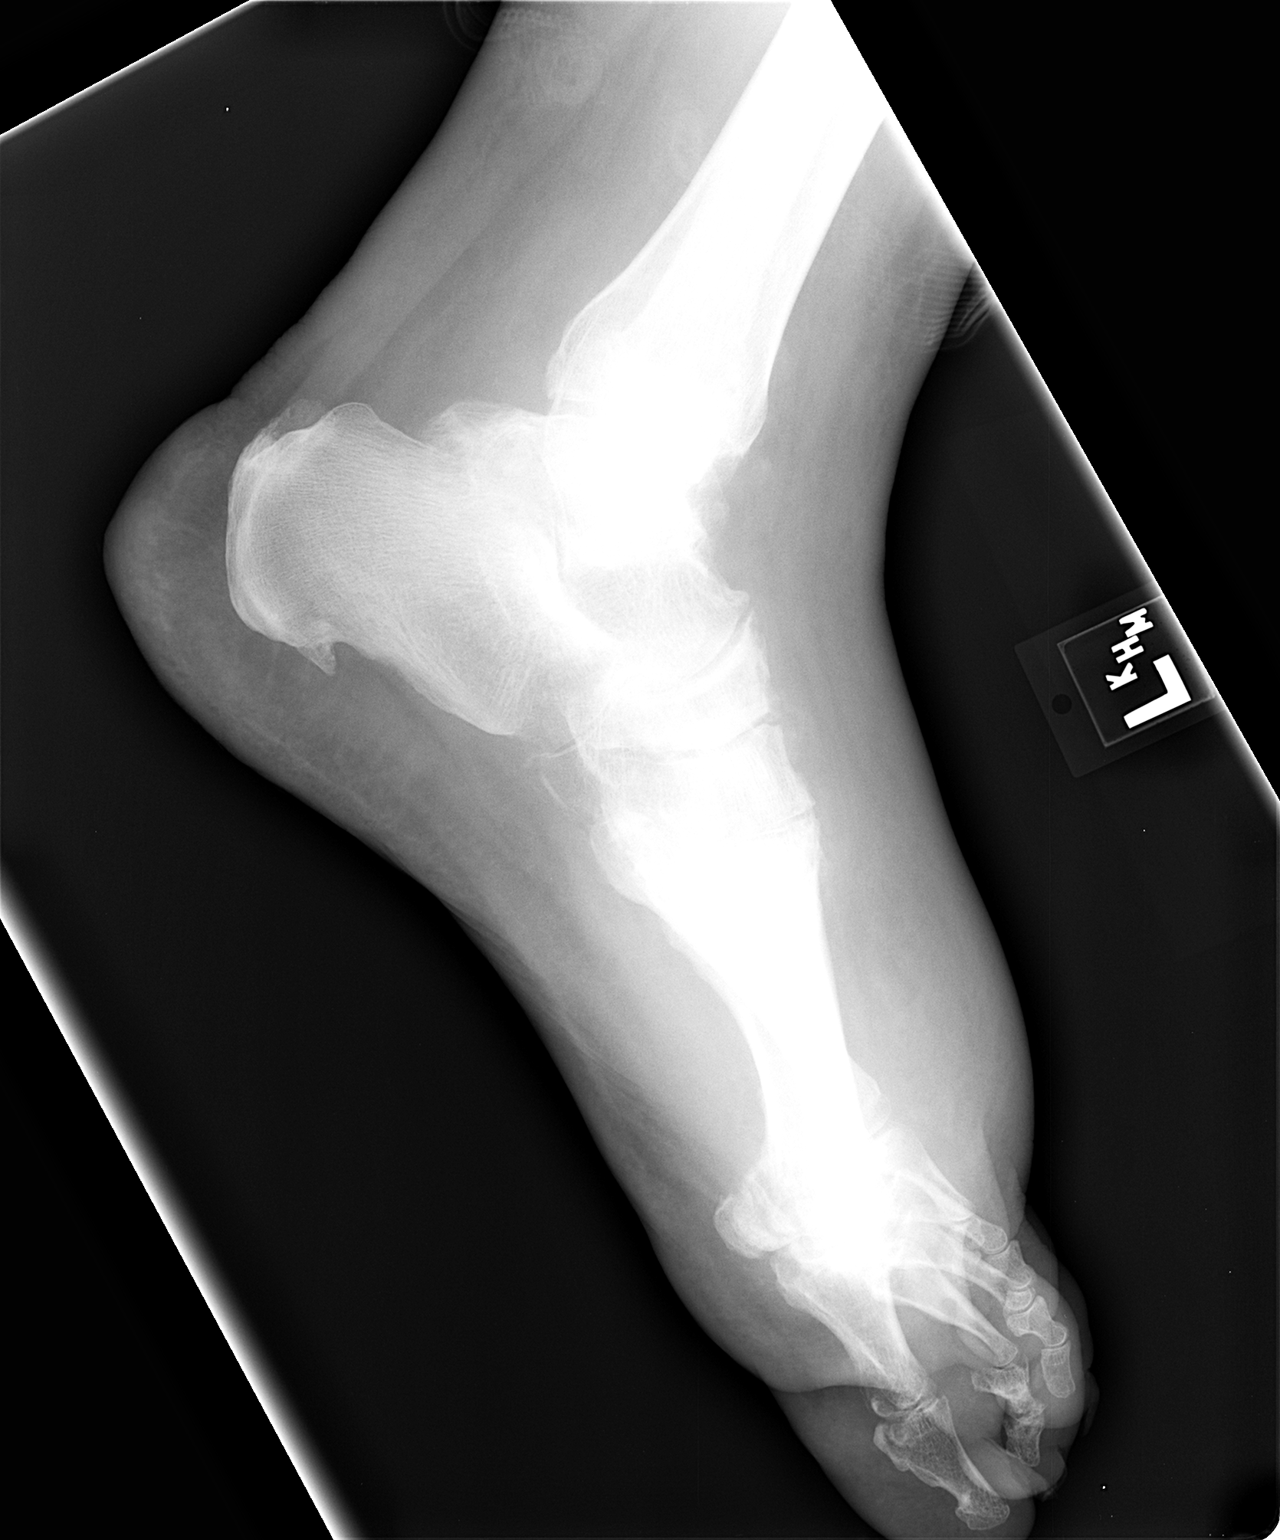

[3 of 3 positions shown; findings below may reference images not displayed]

FINDINGS: Bones are diffusely demineralized.  No acute findings.
Chronic arthropathy is again noted in the MTP joint of the great
toe.  Appearance is stable.  Changes in the DIP joint of the second
toe may be related to previous trauma.  The mild degenerative
changes in the midfoot are stable.
IMPRESSION: Stable exam.  No acute bony findings.

## 2010-11-08 LAB — BASIC METABOLIC PANEL
Calcium: 9.4
GFR calc Af Amer: 53 — ABNORMAL LOW
GFR calc non Af Amer: 44 — ABNORMAL LOW
Glucose, Bld: 97
Potassium: 4.4
Sodium: 141

## 2010-11-08 LAB — URINALYSIS, ROUTINE W REFLEX MICROSCOPIC
Bilirubin Urine: NEGATIVE
Glucose, UA: NEGATIVE
Ketones, ur: NEGATIVE
Nitrite: NEGATIVE
Specific Gravity, Urine: >1.03 — ABNORMAL HIGH
pH: 5.5

## 2010-11-08 LAB — URINE MICROSCOPIC-ADD ON

## 2010-11-08 LAB — DIFFERENTIAL
Basophils Absolute: 0.1
Eosinophils Relative: 0
Lymphocytes Relative: 12
Lymphs Abs: 1.8
Monocytes Absolute: 1.8 — ABNORMAL HIGH
Monocytes Relative: 12
Neutro Abs: 11.6 — ABNORMAL HIGH

## 2010-11-08 LAB — CBC
HCT: 45.1
Hemoglobin: 14.7
RBC: 5.34
RDW: 16.9 — ABNORMAL HIGH
WBC: 15.3 — ABNORMAL HIGH

## 2010-11-11 MED FILL — Oxycodone w/ Acetaminophen Tab 5-325 MG: ORAL | Qty: 6 | Status: AC

## 2010-11-15 LAB — CBC
HCT: 41.9
Hemoglobin: 13
MCHC: 32.5
MCHC: 33
MCV: 84.5
MCV: 84.3
MCV: 85.1
Platelets: 190
Platelets: 229
RBC: 4.81
RDW: 17 — ABNORMAL HIGH
WBC: 11.3 — ABNORMAL HIGH
WBC: 10.2
WBC: 9.5

## 2010-11-15 LAB — BASIC METABOLIC PANEL
BUN: 29 — ABNORMAL HIGH
BUN: 40 — ABNORMAL HIGH
CO2: 23
CO2: 23
Chloride: 105
Chloride: 107
Chloride: 108
Creatinine, Ser: 1.93 — ABNORMAL HIGH
Creatinine, Ser: 2.18 — ABNORMAL HIGH
GFR calc Af Amer: 47 — ABNORMAL LOW
Glucose, Bld: 126 — ABNORMAL HIGH
Potassium: 3.7
Sodium: 138

## 2010-11-15 LAB — DIFFERENTIAL
Basophils Relative: 1
Eosinophils Absolute: 0.1
Eosinophils Absolute: 0.1
Eosinophils Relative: 1
Lymphs Abs: 0.5 — ABNORMAL LOW
Lymphs Abs: 1.4
Lymphs Abs: 1.6
Monocytes Absolute: 0.4
Monocytes Absolute: 0.2
Monocytes Relative: 4
Monocytes Relative: 2 — ABNORMAL LOW
Neutro Abs: 10.3 — ABNORMAL HIGH
Neutro Abs: 7.5
Neutrophils Relative %: 91 — ABNORMAL HIGH
Neutrophils Relative %: 79 — ABNORMAL HIGH

## 2010-11-15 LAB — CULTURE, BLOOD (ROUTINE X 2): Report Status: 9022008

## 2010-11-15 LAB — URINE MICROSCOPIC-ADD ON

## 2010-11-15 LAB — URINALYSIS, ROUTINE W REFLEX MICROSCOPIC
Bilirubin Urine: NEGATIVE
Glucose, UA: NEGATIVE
Specific Gravity, Urine: 1.025

## 2010-11-15 LAB — URINE CULTURE

## 2010-11-15 LAB — URIC ACID: Uric Acid, Serum: 11.2 — ABNORMAL HIGH

## 2010-11-18 LAB — URIC ACID: Uric Acid, Serum: 12.3 — ABNORMAL HIGH

## 2010-12-10 ENCOUNTER — Other Ambulatory Visit: Payer: Self-pay

## 2010-12-10 ENCOUNTER — Encounter (HOSPITAL_COMMUNITY): Payer: Self-pay | Admitting: Emergency Medicine

## 2010-12-10 ENCOUNTER — Emergency Department (HOSPITAL_COMMUNITY)
Admission: EM | Admit: 2010-12-10 | Discharge: 2010-12-10 | Disposition: A | Discharge status: Home / Self Care | Payer: Medicaid Other | Attending: Emergency Medicine | Admitting: Emergency Medicine

## 2010-12-10 DIAGNOSIS — I498 Other specified cardiac arrhythmias: Secondary | ICD-10-CM | POA: Insufficient documentation

## 2010-12-10 DIAGNOSIS — N289 Disorder of kidney and ureter, unspecified: Secondary | ICD-10-CM | POA: Insufficient documentation

## 2010-12-10 DIAGNOSIS — J3489 Other specified disorders of nose and nasal sinuses: Secondary | ICD-10-CM | POA: Insufficient documentation

## 2010-12-10 DIAGNOSIS — M549 Dorsalgia, unspecified: Secondary | ICD-10-CM | POA: Insufficient documentation

## 2010-12-10 DIAGNOSIS — R5381 Other malaise: Secondary | ICD-10-CM | POA: Insufficient documentation

## 2010-12-10 DIAGNOSIS — E119 Type 2 diabetes mellitus without complications: Secondary | ICD-10-CM | POA: Insufficient documentation

## 2010-12-10 DIAGNOSIS — I10 Essential (primary) hypertension: Secondary | ICD-10-CM | POA: Insufficient documentation

## 2010-12-10 DIAGNOSIS — R5383 Other fatigue: Secondary | ICD-10-CM | POA: Insufficient documentation

## 2010-12-10 DIAGNOSIS — R42 Dizziness and giddiness: Secondary | ICD-10-CM

## 2010-12-10 DIAGNOSIS — G8929 Other chronic pain: Secondary | ICD-10-CM | POA: Insufficient documentation

## 2010-12-10 DIAGNOSIS — M109 Gout, unspecified: Secondary | ICD-10-CM | POA: Insufficient documentation

## 2010-12-10 DIAGNOSIS — Z760 Encounter for issue of repeat prescription: Secondary | ICD-10-CM | POA: Insufficient documentation

## 2010-12-10 MED ORDER — OXYCODONE-ACETAMINOPHEN 5-325 MG PO TABS
1.0000 | ORAL_TABLET | Freq: Once | ORAL | Status: AC
  Administered 2010-12-10: 1 via ORAL
  Filled 2010-12-10: qty 1

## 2010-12-10 NOTE — ED Provider Notes (Signed)
History  Scribed for Sharyon Cable, MD, the patient was seen in [facility]. The chart was scribed by Clarisa Fling. The patients care was started at 5:35 PM.   CSN: [id number] Arrival date & time: 12/10/2010  4:45 PM   First MD Initiated Contact with Patient 12/10/10 1717      Chief Complaint  Patient presents with  . Dizziness  . Fatigue  . Medication Refill  . Heartburn    HPI Jack Irwin is a 45 y.o. male who presents to the Emergency Department complaining of dizziness. Pt reports that everything is spinning. Patient also reports having heartburn and congestion. Patient also requesting refill on oxycodone. Denies any headache, vision loss or SOB. Pt is not on dialysis. There are no other associated symptoms and no other alleviating or aggravating factors.  He denies active CP  Past Medical History  Diagnosis Date  . Hypertension   . Renal insufficiency   . Diabetes mellitus   . Gout   . Chronic back pain     Past Surgical History  Procedure Date  . None     Family History  Problem Relation Age of Onset  . Diabetes Mother   . Hypertension Mother   . Heart failure Mother   . Hyperlipidemia Mother   . Cancer Father   . Diabetes Father   . Hypertension Father   . Hyperlipidemia Father     History  Substance Use Topics  . Smoking status: Never Smoker   . Smokeless tobacco: Never Used  . Alcohol Use: No      Review of Systems  HENT: Positive for congestion.   Eyes: Negative for visual disturbance.  Respiratory: Negative for shortness of breath.   Neurological: Positive for dizziness and weakness. Negative for headaches.  All other systems reviewed and are negative.    Allergies  Review of patient's allergies indicates no known allergies.  Home Medications   Current Outpatient Rx  Name Route Sig Dispense Refill  . AMITRIPTYLINE HCL 25 MG PO TABS Oral Take 25 mg by mouth at bedtime.      [name] CALCIUM ACETATE 667 MG PO CAPS Oral Take 1,334 mg  by mouth daily.     . FEBUXOSTAT 40 MG PO TABS Oral Take 80 mg by mouth every other day.     [name] METOPROLOL TARTRATE 50 MG PO TABS Oral Take 50 mg by mouth 2 (two) times daily.      . OXYCODONE-ACETAMINOPHEN 5-325 MG PO TABS Oral Take 1 tablet by mouth daily as needed. For pain    . PREDNISONE 20 MG PO TABS Oral Take 20 mg by mouth daily as needed. For swelling or inflammation    . VARDENAFIL HCL 20 MG PO TABS Oral Take 20 mg by mouth daily as needed. For sexual intercourse    . VARDENAFIL HCL 10 MG PO TBDP Oral Take 1 tablet by mouth once as needed. For intercourse    . AMLODIPINE BESYLATE 5 MG PO TABS Oral Take 5 mg by mouth daily.      [name] CLOTRIMAZOLE-BETAMETHASONE 1-0.05 % EX CREA  Apply to affected area 2 times daily prn 30 g 0  . COLCHICINE 0.6 MG PO TABS Oral Take 0.6 mg by mouth 2 (two) times daily as needed. For gout      BP 156/115  Pulse 102  Temp(Src) 98.1 F (36.7 C) (Oral)  Resp 20  Ht 5\' 11"  (1.803 m)  Wt 300 lb (136.079 kg)  BMI 41.84 kg/m2  SpO2 99%  Physical Exam CONSTITUTIONAL: Well developed/well nourished HEAD AND FACE: Normocephalic/atraumatic EYES: EOMI/PERRL ENMT: Mucous membranes moist NECK: supple no meningeal signs SPINE:entire spine nontender CV: S1/S2 noted, no murmurs/rubs/gallops noted LUNGS: Lungs are clear to auscultation bilaterally, no apparent distress ABDOMEN: soft, nontender, no rebound or guarding GU:no cva tenderness EXTREMITIES: pulses normal, full ROM SKIN: warm, color normal PSYCH: no abnormalities of mood noted Neuro - Awake/alert, facies symmetric, no arm or leg drift is noted Cranial nerves 3/4/5/6/08/11/08/11/12 tested and intact, no pastpointing, no nystagmus    ED Course  Procedures  DIAGNOSTIC STUDIES: Oxygen Saturation is 99% on room airn, normal by my interpretation.      COORDINATION OF CARE: 5:35PM:  - Patient evaluated by ED physician, Percocet, EKG ordered   MDM  Nursing notes reviewed and considered in  documentation Previous records reviewed and considered   Pt well appearing, no distress, keeps asking me to refill his pain meds.  No focal neuro deficits.  He is requesting food as well.  suspicion for acute neurologic/cardiac event is low    Date: 12/10/2010  Rate: 84  Rhythm: normal sinus rhythm  QRS Axis: normal  Intervals: normal  ST/T Wave abnormalities: nonspecific ST changes  Conduction Disutrbances:sinus arrythmia  Narrative Interpretation:   Old EKG Reviewed: unchanged     I personally performed the services described in this documentation, which was scribed in my presence. The recorded information has been reviewed and considered.           Sharyon Cable, MD 12/10/10 [phone]

## 2010-12-10 NOTE — ED Notes (Addendum)
Patient c/o dizziness and weakness x2 days. Per patient took shower this morning and the dizziness got progressively worse. Patient also reports having heartburn. Patient also requesting refill on oxycodone.

## 2010-12-16 ENCOUNTER — Emergency Department (HOSPITAL_COMMUNITY)
Admission: EM | Admit: 2010-12-16 | Discharge: 2010-12-16 | Disposition: A | Discharge status: Home / Self Care | Payer: Medicaid Other | Attending: Emergency Medicine | Admitting: Emergency Medicine

## 2010-12-16 ENCOUNTER — Encounter (HOSPITAL_COMMUNITY): Payer: Self-pay | Admitting: Emergency Medicine

## 2010-12-16 DIAGNOSIS — N289 Disorder of kidney and ureter, unspecified: Secondary | ICD-10-CM | POA: Insufficient documentation

## 2010-12-16 DIAGNOSIS — L0231 Cutaneous abscess of buttock: Secondary | ICD-10-CM | POA: Insufficient documentation

## 2010-12-16 DIAGNOSIS — M549 Dorsalgia, unspecified: Secondary | ICD-10-CM | POA: Insufficient documentation

## 2010-12-16 DIAGNOSIS — L0291 Cutaneous abscess, unspecified: Secondary | ICD-10-CM

## 2010-12-16 DIAGNOSIS — E119 Type 2 diabetes mellitus without complications: Secondary | ICD-10-CM | POA: Insufficient documentation

## 2010-12-16 DIAGNOSIS — G8929 Other chronic pain: Secondary | ICD-10-CM | POA: Insufficient documentation

## 2010-12-16 DIAGNOSIS — M109 Gout, unspecified: Secondary | ICD-10-CM | POA: Insufficient documentation

## 2010-12-16 DIAGNOSIS — L03317 Cellulitis of buttock: Secondary | ICD-10-CM | POA: Insufficient documentation

## 2010-12-16 DIAGNOSIS — I10 Essential (primary) hypertension: Secondary | ICD-10-CM | POA: Insufficient documentation

## 2010-12-16 MED ORDER — OXYCODONE-ACETAMINOPHEN 5-325 MG PO TABS
1.0000 | ORAL_TABLET | Freq: Once | ORAL | Status: AC
  Administered 2010-12-16: 1 via ORAL
  Filled 2010-12-16: qty 1

## 2010-12-16 MED ORDER — DOXYCYCLINE HYCLATE 100 MG PO TABS
100.0000 mg | ORAL_TABLET | Freq: Once | ORAL | Status: AC
  Administered 2010-12-16: 100 mg via ORAL
  Filled 2010-12-16: qty 1

## 2010-12-16 MED ORDER — HYDROCODONE-ACETAMINOPHEN 5-325 MG PO TABS: ORAL_TABLET | ORAL | Status: AC

## 2010-12-16 MED ORDER — KETOROLAC TROMETHAMINE 60 MG/2ML IM SOLN
60.0000 mg | Freq: Once | INTRAMUSCULAR | Status: AC
  Administered 2010-12-16: 60 mg via INTRAMUSCULAR
  Filled 2010-12-16: qty 2

## 2010-12-16 MED ORDER — DOXYCYCLINE HYCLATE 100 MG PO CAPS: 100.0000 mg | ORAL_CAPSULE | Freq: Two times a day (BID) | ORAL | Status: AC

## 2010-12-16 NOTE — ED Notes (Signed)
Pt c/o gout in his left hand and now c/o rectal bleeding x 2 days.

## 2010-12-16 NOTE — ED Provider Notes (Signed)
History     CSN: [id number] Arrival date & time: 12/16/2010  9:34 AM   First MD Initiated Contact with Patient 12/16/10 1010      Chief Complaint  Patient presents with  . Rectal Pain  . Gout    (Consider location/radiation/quality/duration/timing/severity/associated sxs/prior treatment) HPI Comments: Patient c/o pain with drainage of pus and blood to his left buttock for 2-3 days.  States he thinks he may have a "boil" or bleeding from his rectum.  He also states that he is having increased pain in left hand for several days.  Has a hx of gout  And states his hand pain feels same as previous gout attacks.  Also requests pain medication stating that he doesn't have money to get his pain medication filled.  He denies abd pain, shortness of breath, fever or recent constipation  Patient is a 45 y.o. male presenting with abscess. The history is provided by the patient.  Abscess  This is a new problem. The current episode started yesterday. The onset was gradual. The problem occurs occasionally. The problem has been unchanged. The abscess is present on the left buttock. The problem is mild. The abscess is characterized by painfulness and draining. It is unknown what he was exposed to. The abscess first occurred at home. Pertinent negatives include no fever, no diarrhea, no vomiting, no congestion, no sore throat, no decreased responsiveness and no cough. There were no sick contacts. He has received no recent medical care.    Past Medical History  Diagnosis Date  . Hypertension   . Renal insufficiency   . Diabetes mellitus   . Gout   . Chronic back pain     Past Surgical History  Procedure Date  . None     Family History  Problem Relation Age of Onset  . Diabetes Mother   . Hypertension Mother   . Heart failure Mother   . Hyperlipidemia Mother   . Cancer Father   . Diabetes Father   . Hypertension Father   . Hyperlipidemia Father     History  Substance Use Topics  .  Smoking status: Never Smoker   . Smokeless tobacco: Never Used  . Alcohol Use: No      Review of Systems  Constitutional: Negative for fever, chills, activity change, appetite change, decreased responsiveness and fatigue.  HENT: Negative for congestion, sore throat, trouble swallowing, neck pain and neck stiffness.   Respiratory: Negative for cough, shortness of breath and wheezing.   Cardiovascular: Negative for chest pain and palpitations.  Gastrointestinal: Negative for nausea, vomiting, abdominal pain, diarrhea and blood in stool.  Genitourinary: Negative for dysuria, hematuria, flank pain, decreased urine volume and difficulty urinating.  Musculoskeletal: Positive for arthralgias. Negative for myalgias, back pain and joint swelling.  Skin: Positive for wound. Negative for rash.  Neurological: Negative for dizziness, weakness and numbness.  Hematological: Negative for adenopathy. Does not bruise/bleed easily.  All other systems reviewed and are negative.    Allergies  Review of patient's allergies indicates no known allergies.  Home Medications   Current Outpatient Rx  Name Route Sig Dispense Refill  . AMITRIPTYLINE HCL 25 MG PO TABS Oral Take 25 mg by mouth at bedtime.      [name] AMLODIPINE BESYLATE 5 MG PO TABS Oral Take 5 mg by mouth daily.      [name] CALCIUM ACETATE 667 MG PO CAPS Oral Take 1,334 mg by mouth daily.     [name] CLOTRIMAZOLE-BETAMETHASONE 1-0.05 % EX CREA  Apply to affected area 2 times daily prn 30 g 0  . COLCHICINE 0.6 MG PO TABS Oral Take 0.6 mg by mouth 2 (two) times daily as needed. For gout    . FEBUXOSTAT 40 MG PO TABS Oral Take 80 mg by mouth every other day.     [name] METOPROLOL TARTRATE 50 MG PO TABS Oral Take 50 mg by mouth 2 (two) times daily.      . OXYCODONE-ACETAMINOPHEN 5-325 MG PO TABS Oral Take 1 tablet by mouth daily as needed. For pain    . PREDNISONE 20 MG PO TABS Oral Take 20 mg by mouth daily as needed. For swelling or inflammation    . VARDENAFIL HCL  20 MG PO TABS Oral Take 20 mg by mouth daily as needed. For sexual intercourse    . VARDENAFIL HCL 10 MG PO TBDP Oral Take 1 tablet by mouth once as needed. For intercourse      BP 136/98  Pulse 101  Temp(Src) 98.6 F (37 C) (Oral)  Resp 20  Ht 5\' 11"  (1.803 m)  Wt 300 lb (136.079 kg)  BMI 41.84 kg/m2  SpO2 99%  Physical Exam  Nursing note and vitals reviewed. Constitutional: He is oriented to person, place, and time. He appears well-developed and well-nourished. No distress.  HENT:  Head: Normocephalic and atraumatic.  Mouth/Throat: Oropharynx is clear and moist.  Neck: Normal range of motion. Neck supple.  Cardiovascular: Normal rate, regular rhythm and normal heart sounds.   Pulmonary/Chest: Effort normal and breath sounds normal. No respiratory distress. He exhibits no tenderness.  Abdominal: Soft. He exhibits no distension and no mass. There is no tenderness. There is no rebound and no guarding.  Genitourinary: Rectal exam shows no external hemorrhoid, no mass and anal tone normal.     Musculoskeletal: Normal range of motion. He exhibits tenderness. He exhibits no edema.       Left hand: He exhibits tenderness. He exhibits normal range of motion, normal two-point discrimination, normal capillary refill, no deformity, no laceration and no swelling. normal sensation noted. Normal strength noted.  Lymphadenopathy:    He has no cervical adenopathy.  Neurological: He is alert and oriented to person, place, and time. No cranial nerve deficit. He exhibits normal muscle tone. Coordination normal.  Skin: Skin is warm and dry.       See MS exam  Psychiatric: He has a normal mood and affect.    ED Course  Procedures (including critical care time)       MDM    10:57 AM patient is alert, NAD. Smiles.  Vitals stable.  Non-toxic appearing.  Has 3 cm draining abscess to the left gluteal fold.  No significant erythema or induration. Does not appear to need I&D at this time.    Patient has multiple ED visits, appears to be at his baseline today.  Pt feels improved after observation and/or treatment in ED.    Patient / Family / Caregiver understand and agree with initial ED impression and plan with expectations set for ED visit.         Lupie Sawa L. [place], [place] 12/17/10 2110

## 2010-12-19 NOTE — ED Provider Notes (Signed)
Medical screening examination/treatment/procedure(s) were performed by non-physician practitioner and as supervising physician I was immediately available for consultation/collaboration.  Gypsy Balsam. Olin Hauser, MD 12/19/10 1038

## 2011-01-11 IMAGING — CR DG CHEST 1V PORT
1 series · 1 of 1 positions shown · non-contrast
Comparison: 02/27/2009

CLINICAL DATA: Back pain.

PORTABLE CHEST - 1 VIEW

[view not recorded]
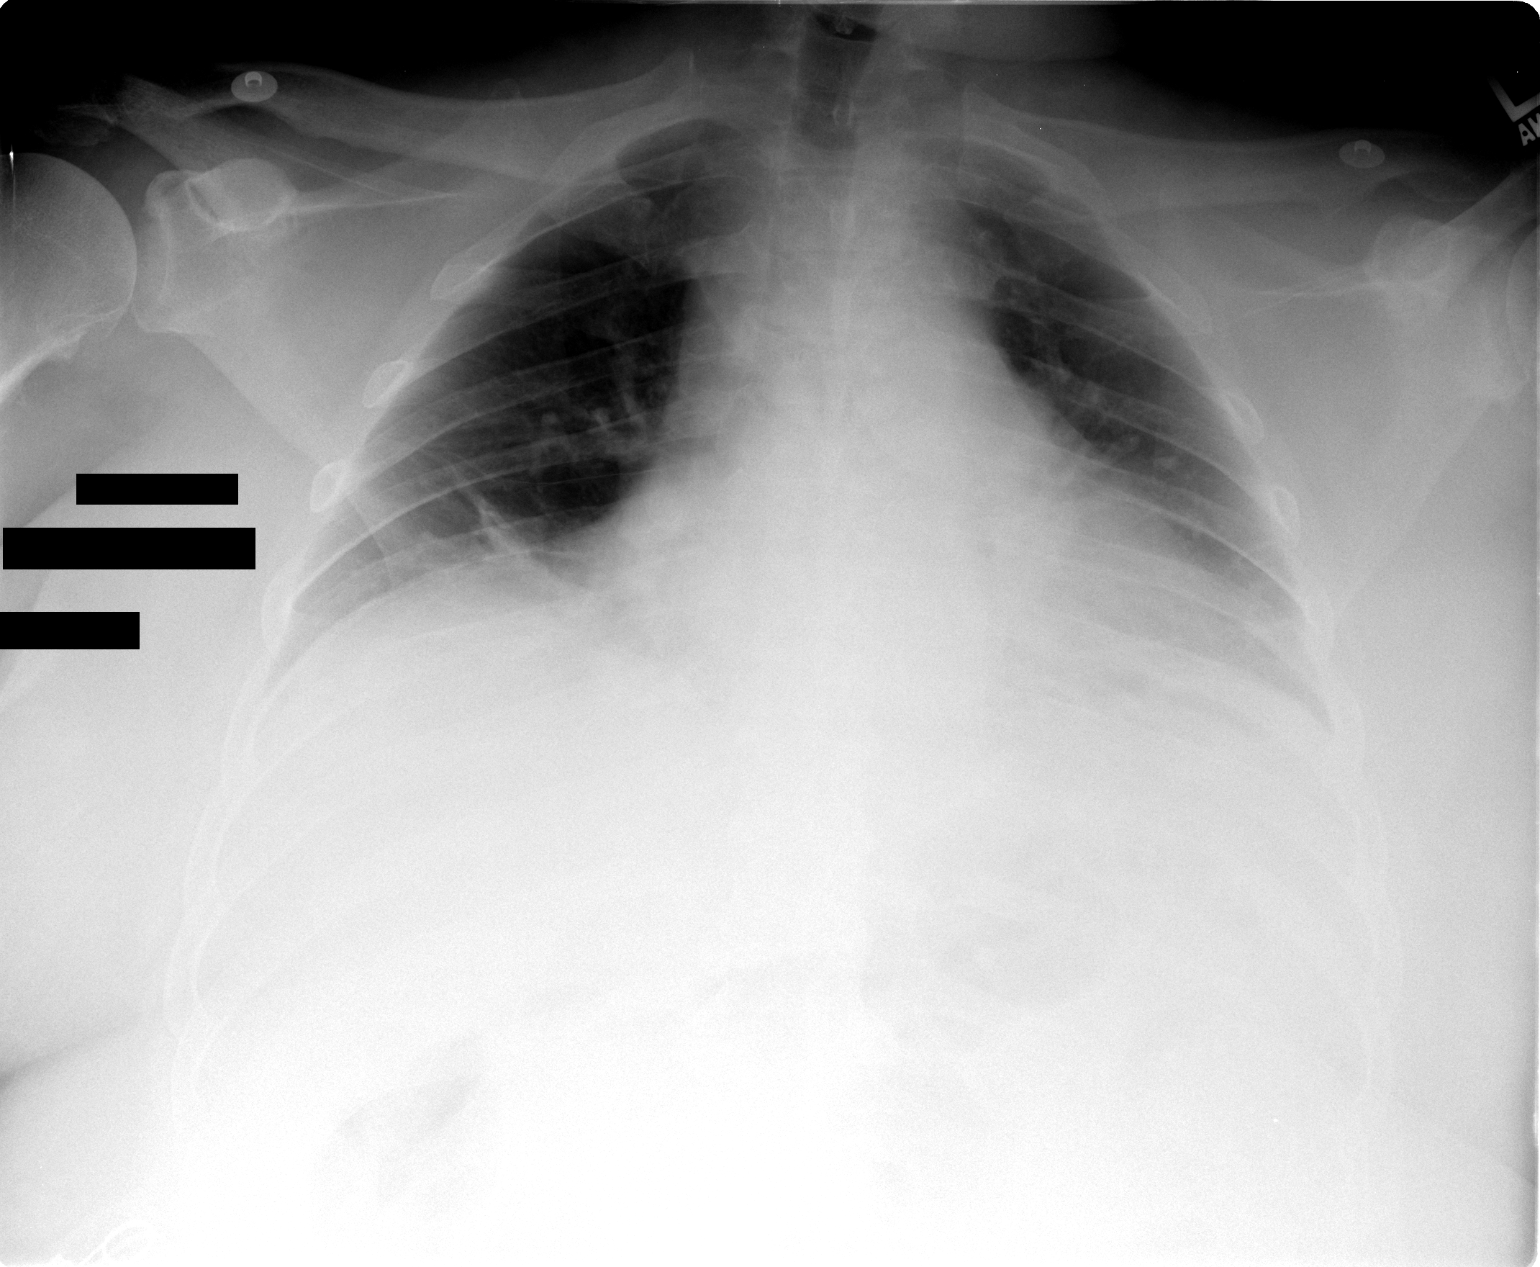

[1 of 1 positions shown; findings below may reference images not displayed]

FINDINGS: Cardiomegaly is noted.
Bibasilar atelectasis is present in this low volume film.
No definite focal airspace disease, pleural effusion, or
pneumothorax identified.
No acute bony abnormalities are identified.
IMPRESSION: Cardiomegaly with bibasilar atelectasis.

## 2011-01-17 IMAGING — CR DG FOOT COMPLETE 3+V*L*
3 series · 3 of 3 positions shown · non-contrast
Comparison: 05/06/2009

CLINICAL DATA: gout.  Pain.  Infection.

LEFT FOOT - COMPLETE 3+ VIEW

[view not recorded (1 of 3)]
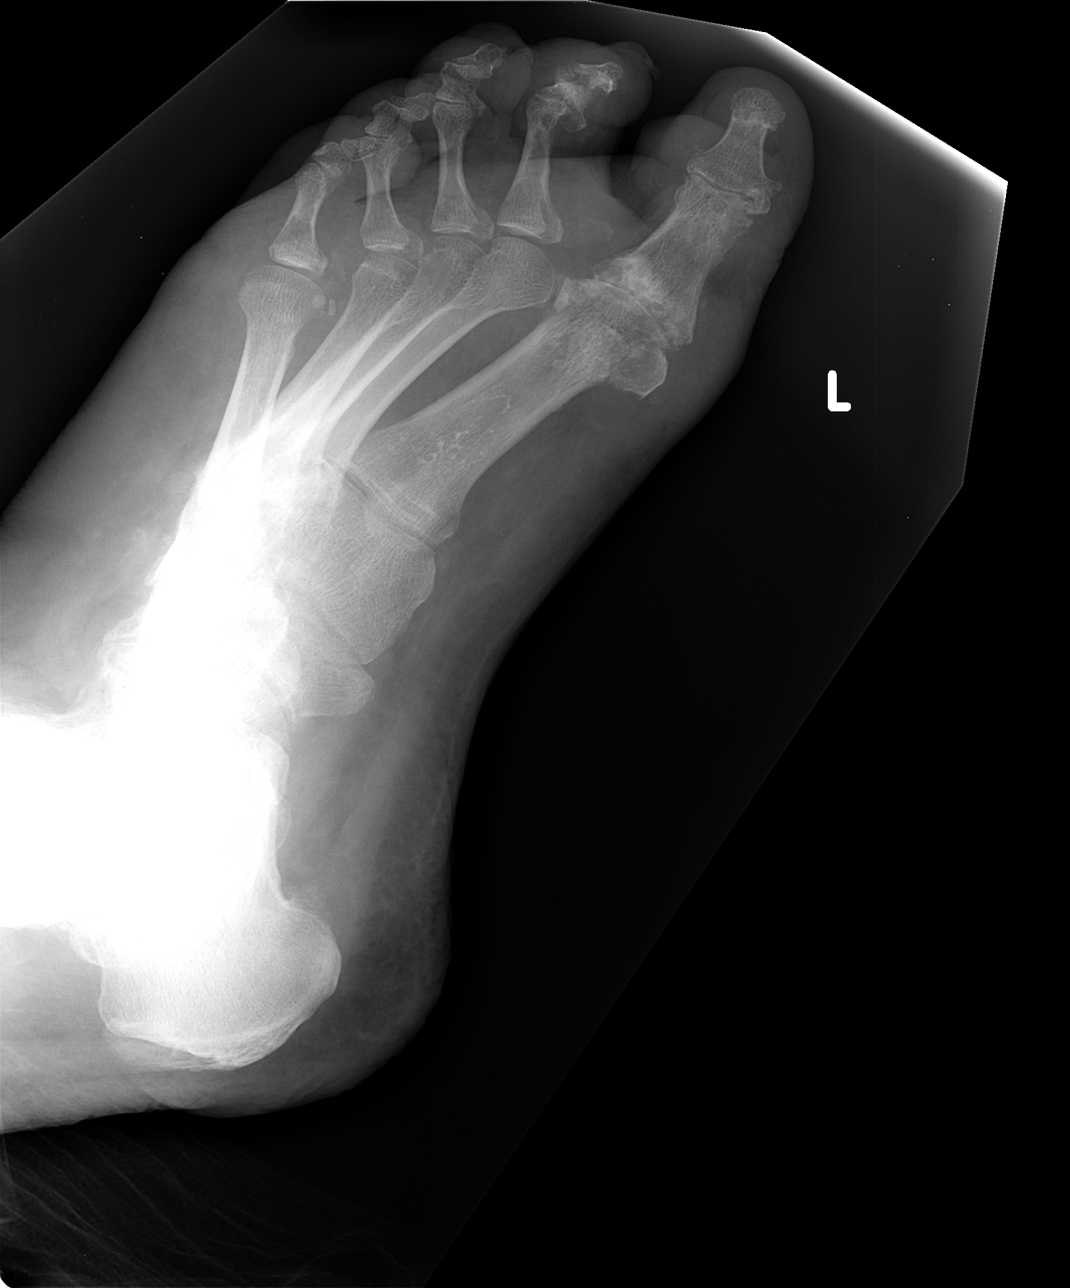

[view not recorded (2 of 3)]
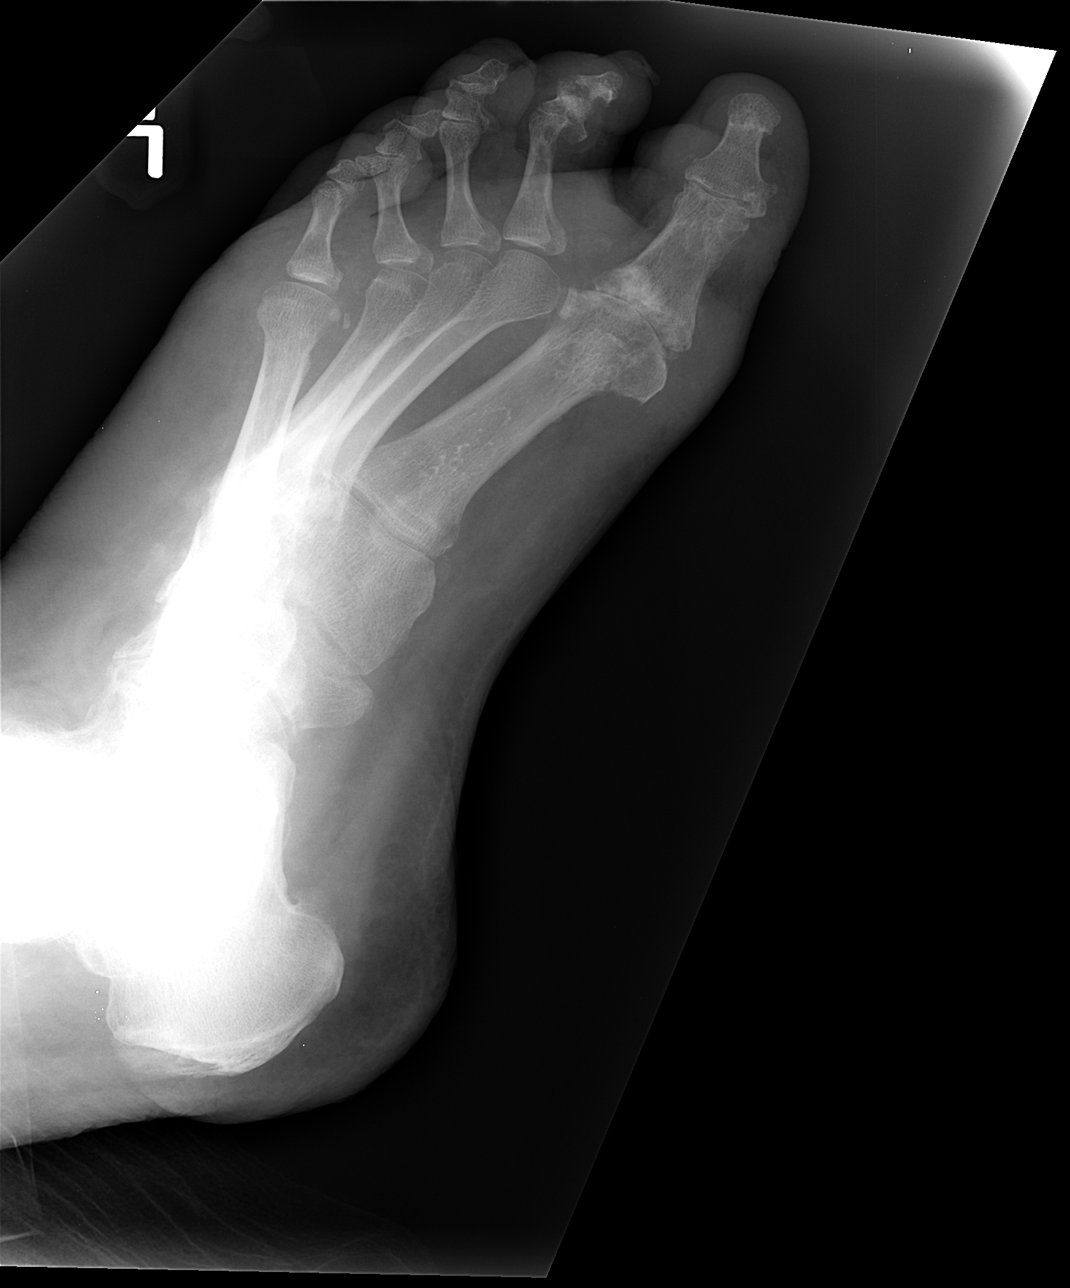

[view not recorded (3 of 3)]
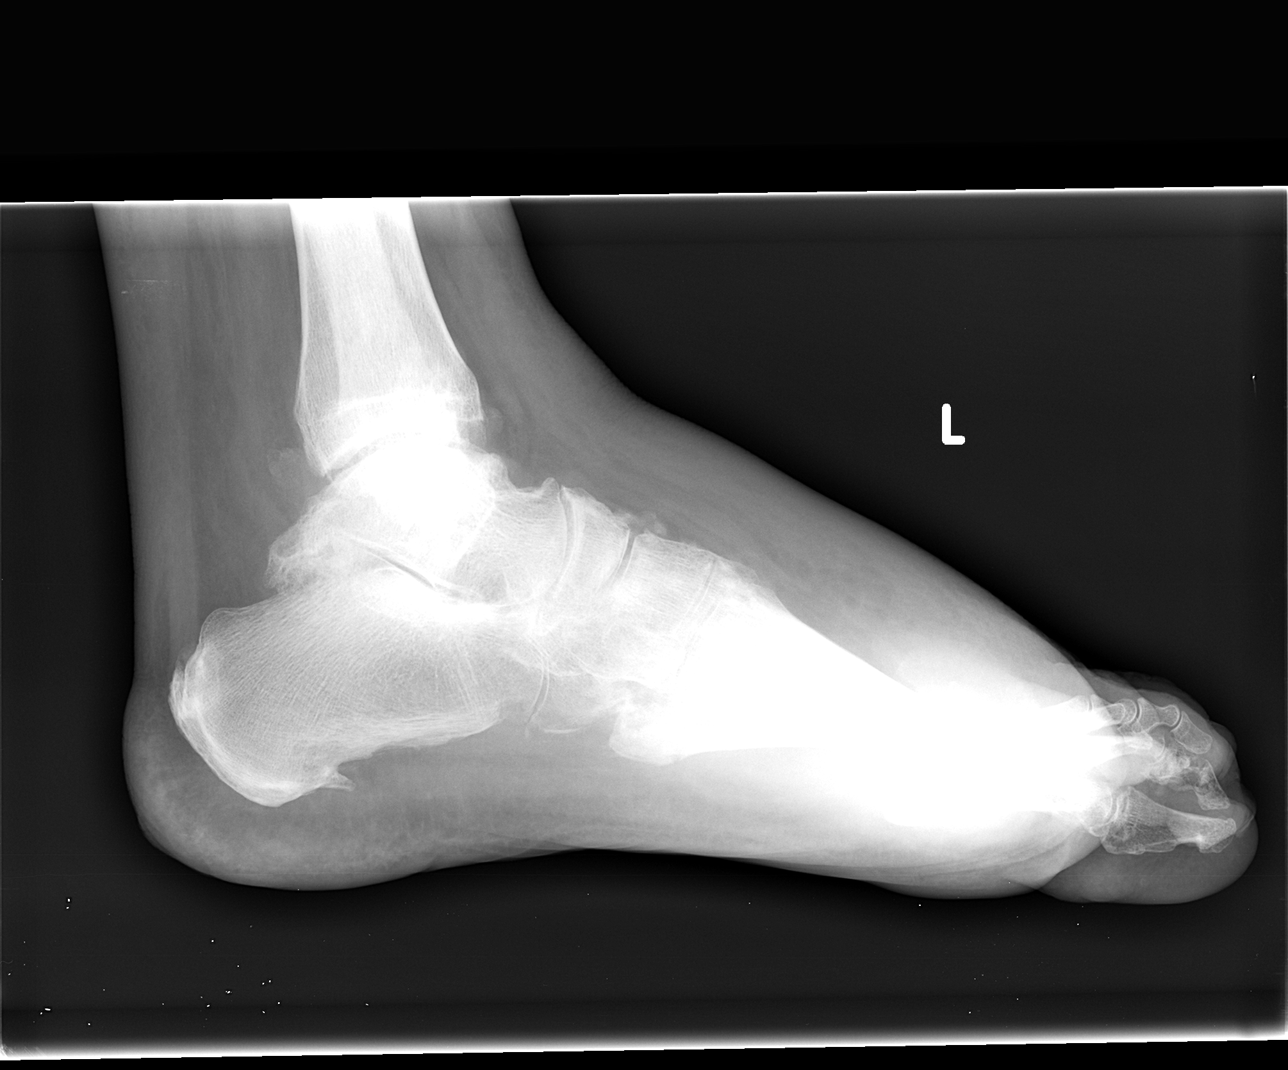

[3 of 3 positions shown; findings below may reference images not displayed]

FINDINGS: There is severe soft tissue swelling.  There are changes
of advanced arthritis throughout the region including the ankle
joint, midfoot and forefoot.  Particularly, erosive changes are
noted affecting the interphalangeal joints of the second toe, the
MTP joint of the great toe, the midfoot in the ankle joint.
Findings are suspicious for involvement by gout.  The possibility
of coexistent infection cannot be excluded in any of these
locations.
IMPRESSION: Swelling and erosive arthritis as described above.  Certainly,
there could be coexistent soft tissue infection but that cannot be
specifically discerned in the setting of extensive gout.

## 2011-01-22 DIAGNOSIS — M109 Gout, unspecified: Secondary | ICD-10-CM | POA: Insufficient documentation

## 2011-01-22 DIAGNOSIS — I479 Paroxysmal tachycardia, unspecified: Secondary | ICD-10-CM | POA: Insufficient documentation

## 2011-01-22 DIAGNOSIS — I509 Heart failure, unspecified: Secondary | ICD-10-CM | POA: Insufficient documentation

## 2011-02-12 ENCOUNTER — Encounter (HOSPITAL_COMMUNITY): Payer: Self-pay | Admitting: Emergency Medicine

## 2011-02-12 ENCOUNTER — Emergency Department (HOSPITAL_COMMUNITY): Payer: Medicaid Other

## 2011-02-12 ENCOUNTER — Emergency Department (HOSPITAL_COMMUNITY)
Admission: EM | Admit: 2011-02-12 | Discharge: 2011-02-12 | Disposition: A | Discharge status: Home / Self Care | Payer: Medicaid Other | Attending: Emergency Medicine | Admitting: Emergency Medicine

## 2011-02-12 DIAGNOSIS — H109 Unspecified conjunctivitis: Secondary | ICD-10-CM | POA: Insufficient documentation

## 2011-02-12 DIAGNOSIS — G8929 Other chronic pain: Secondary | ICD-10-CM | POA: Insufficient documentation

## 2011-02-12 DIAGNOSIS — I10 Essential (primary) hypertension: Secondary | ICD-10-CM | POA: Insufficient documentation

## 2011-02-12 DIAGNOSIS — E669 Obesity, unspecified: Secondary | ICD-10-CM | POA: Insufficient documentation

## 2011-02-12 DIAGNOSIS — E119 Type 2 diabetes mellitus without complications: Secondary | ICD-10-CM | POA: Insufficient documentation

## 2011-02-12 DIAGNOSIS — B349 Viral infection, unspecified: Secondary | ICD-10-CM

## 2011-02-12 DIAGNOSIS — I517 Cardiomegaly: Secondary | ICD-10-CM | POA: Insufficient documentation

## 2011-02-12 DIAGNOSIS — R51 Headache: Secondary | ICD-10-CM | POA: Insufficient documentation

## 2011-02-12 DIAGNOSIS — R059 Cough, unspecified: Secondary | ICD-10-CM | POA: Insufficient documentation

## 2011-02-12 DIAGNOSIS — N289 Disorder of kidney and ureter, unspecified: Secondary | ICD-10-CM | POA: Insufficient documentation

## 2011-02-12 DIAGNOSIS — B9789 Other viral agents as the cause of diseases classified elsewhere: Secondary | ICD-10-CM | POA: Insufficient documentation

## 2011-02-12 DIAGNOSIS — M549 Dorsalgia, unspecified: Secondary | ICD-10-CM | POA: Insufficient documentation

## 2011-02-12 DIAGNOSIS — R05 Cough: Secondary | ICD-10-CM | POA: Insufficient documentation

## 2011-02-12 LAB — CBC
HCT: 38.1 % — ABNORMAL LOW (ref 39.0–52.0)
Hemoglobin: 11.8 g/dL — ABNORMAL LOW (ref 13.0–17.0)
MCV: 87.6 fL (ref 78.0–100.0)
RBC: 4.35 MIL/uL (ref 4.22–5.81)
RDW: 17.8 % — ABNORMAL HIGH (ref 11.5–15.5)
WBC: 9.2 10*3/uL (ref 4.0–10.5)

## 2011-02-12 LAB — URINALYSIS, ROUTINE W REFLEX MICROSCOPIC
Glucose, UA: NEGATIVE mg/dL
Hgb urine dipstick: NEGATIVE
Leukocytes, UA: NEGATIVE
Specific Gravity, Urine: 1.025 (ref 1.005–1.030)
pH: 6 (ref 5.0–8.0)

## 2011-02-12 LAB — COMPREHENSIVE METABOLIC PANEL
ALT: 12 U/L (ref 0–53)
AST: 17 U/L (ref 0–37)
Albumin: 2.8 g/dL — ABNORMAL LOW (ref 3.5–5.2)
CO2: 25 mEq/L (ref 19–32)
Calcium: 8.7 mg/dL (ref 8.4–10.5)
Creatinine, Ser: 2.82 mg/dL — ABNORMAL HIGH (ref 0.50–1.35)
Sodium: 140 mEq/L (ref 135–145)

## 2011-02-12 LAB — DIFFERENTIAL
Basophils Absolute: 0 10*3/uL (ref 0.0–0.1)
Lymphocytes Relative: 13 % (ref 12–46)
Lymphs Abs: 1.2 10*3/uL (ref 0.7–4.0)
Monocytes Absolute: 1 10*3/uL (ref 0.1–1.0)
Monocytes Relative: 11 % (ref 3–12)
Neutro Abs: 6.9 10*3/uL (ref 1.7–7.7)

## 2011-02-12 MED ORDER — ONDANSETRON HCL 4 MG PO TABS: 8.0000 mg | ORAL_TABLET | Freq: Four times a day (QID) | ORAL | Status: AC

## 2011-02-12 MED ORDER — ACETAMINOPHEN 325 MG PO TABS
650.0000 mg | ORAL_TABLET | Freq: Once | ORAL | Status: AC
  Administered 2011-02-12: 650 mg via ORAL
  Filled 2011-02-12: qty 2

## 2011-02-12 MED ORDER — TOBRAMYCIN 0.3 % OP SOLN
1.0000 [drp] | Freq: Once | OPHTHALMIC | Status: AC
  Administered 2011-02-12: 1 [drp] via OPHTHALMIC
  Filled 2011-02-12: qty 5

## 2011-02-12 MED ORDER — ONDANSETRON 8 MG PO TBDP
8.0000 mg | ORAL_TABLET | Freq: Once | ORAL | Status: AC
  Administered 2011-02-12: 8 mg via ORAL
  Filled 2011-02-12: qty 1

## 2011-02-12 NOTE — ED Provider Notes (Signed)
History     CSN: [id number]  Arrival date & time 02/12/11  0820   First MD Initiated Contact with Patient 02/12/11 [phone]      Chief Complaint  Patient presents with  . URI  . Abdominal Pain    n/v/diarrhea    (Consider location/radiation/quality/duration/timing/severity/associated sxs/prior treatment) HPI Comments: Patient presents with multiple complaints including a cough productive of sputum, headache,  Nasal congestion with yellow drainage,  Both his eyes are watery and red,  With no purulent drainage,  But slightly irritated when blinking,  Nausea without emesis and diarrhea x 2 yesterday,  And one time this morning.  He denies fevers and chills,  And has been maintaining fluid intake,  Appetite has been decreased.  He is nauseated,  But denies abdominal pain.  He was exposed to influenza last week,  Son's girlfriend,  Who lives in the home.  He is a diet controlled diabetic with a history of renal insufficiency and htn.  He took his morning meds including his norvasc and lopressor 2 hours ago.  He has tried no Medicines for his symptoms.  He has recognized no alleviators.   He denies sob and chest pain.  The history is provided by the patient.    Past Medical History  Diagnosis Date  . Hypertension   . Renal insufficiency   . Diabetes mellitus   . Gout   . Chronic back pain     Past Surgical History  Procedure Date  . None     Family History  Problem Relation Age of Onset  . Diabetes Mother   . Hypertension Mother   . Heart failure Mother   . Hyperlipidemia Mother   . Cancer Father   . Diabetes Father   . Hypertension Father   . Hyperlipidemia Father     History  Substance Use Topics  . Smoking status: Never Smoker   . Smokeless tobacco: Never Used  . Alcohol Use: No      Review of Systems  Constitutional: Negative for fever.  HENT: Positive for congestion. Negative for sore throat and neck pain.   Eyes: Positive for discharge.  Respiratory: Positive  for cough. Negative for chest tightness and shortness of breath.   Cardiovascular: Negative for chest pain.  Gastrointestinal: Positive for nausea and diarrhea. Negative for abdominal pain.  Genitourinary: Negative.   Musculoskeletal: Negative for joint swelling and arthralgias.  Skin: Negative.  Negative for rash and wound.  Neurological: Positive for headaches. Negative for dizziness, weakness, light-headedness and numbness.  Hematological: Negative.   Psychiatric/Behavioral: Negative.     Allergies  Review of patient's allergies indicates no known allergies.  Home Medications   Current Outpatient Rx  Name Route Sig Dispense Refill  . AMITRIPTYLINE HCL 25 MG PO TABS Oral Take 25 mg by mouth at bedtime.      [name] CALCIUM ACETATE 667 MG PO CAPS Oral Take 667 mg by mouth daily.     . COLCHICINE 0.6 MG PO TABS Oral Take 0.6 mg by mouth daily as needed. For gout    . CORICIDIN HBP FLU PO Oral Take 1 tablet by mouth daily.    . FEBUXOSTAT 80 MG PO TABS Oral Take 1 tablet by mouth daily.    [name] HYDROCODONE-ACETAMINOPHEN 5-325 MG PO TABS Oral Take 1 tablet by mouth every 6 (six) hours as needed. Pain    . LOSARTAN POTASSIUM 25 MG PO TABS Oral Take 25 mg by mouth daily.    [name] METOPROLOL TARTRATE 50  MG PO TABS Oral Take 50 mg by mouth 2 (two) times daily.      . OXYCODONE-ACETAMINOPHEN 5-325 MG PO TABS Oral Take 1 tablet by mouth daily as needed. For pain    . PREDNISONE 10 MG PO TABS Oral Take 10 mg by mouth daily.    [name] VARDENAFIL HCL 20 MG PO TABS Oral Take 20 mg by mouth daily as needed. For sexual intercourse    . ONDANSETRON HCL 4 MG PO TABS Oral Take 2 tablets (8 mg total) by mouth every 6 (six) hours. If needed for nausea 12 tablet 0    BP 143/112  Pulse 87  Temp(Src) 98.1 F (36.7 C) (Oral)  Resp 17  Ht 5\' 11"  (1.803 m)  Wt 300 lb (136.079 kg)  BMI 41.84 kg/m2  SpO2 99%  Physical Exam  Nursing note and vitals reviewed. Constitutional: He is oriented to person, place, and time.  He appears well-developed and well-nourished.       obese  HENT:  Head: Normocephalic and atraumatic.  Eyes: EOM and lids are normal. Pupils are equal, round, and reactive to light. Right eye exhibits no chemosis, no discharge and no exudate. Left eye exhibits no chemosis, no discharge and no exudate. Right conjunctiva is injected. Left conjunctiva is injected.  Neck: Normal range of motion.  Cardiovascular: Normal rate, regular rhythm, normal heart sounds and intact distal pulses.   Pulmonary/Chest: Effort normal and breath sounds normal. He has no wheezes.  Abdominal: Soft. Bowel sounds are normal. There is no tenderness.  Musculoskeletal: Normal range of motion.  Neurological: He is alert and oriented to person, place, and time.  Skin: Skin is warm and dry.  Psychiatric: He has a normal mood and affect.    ED Course  Procedures (including critical care time)  Labs Reviewed  COMPREHENSIVE METABOLIC PANEL - Abnormal; Notable for the following:    Glucose, Bld 120 (*)    BUN 33 (*)    Creatinine, Ser 2.82 (*)    Albumin 2.8 (*)    GFR calc non Af Amer 26 (*)    GFR calc Af Amer 30 (*)    All other components within normal limits  URINALYSIS, ROUTINE W REFLEX MICROSCOPIC - Abnormal; Notable for the following:    Protein, ur 100 (*)    All other components within normal limits  CBC - Abnormal; Notable for the following:    Hemoglobin 11.8 (*)    HCT 38.1 (*)    RDW 17.8 (*)    Platelets 145 (*)    All other components within normal limits  LIPASE, BLOOD  DIFFERENTIAL  URINE MICROSCOPIC-ADD ON   Dg Chest 2 View  02/12/2011  *RADIOLOGY REPORT*  Clinical Data: Cough and nausea.  CHEST - 2 VIEW  Comparison: CT chest 11/03/2009.  Portable chest 10/12/2010.  Findings: There is cardiomegaly.  Lungs are clear.  No pneumothorax or pleural effusion.  Markedly advanced for age degenerative disease about the left shoulder is noted.  IMPRESSION: No acute abnormality.  Original Report  Authenticated By: Arvid Right. D'ALESSIO, M.D.     1. Viral syndrome       MDM  zofran for nausea.  Rest,  Fluids encouraged.  F/u pcp if not improved over the next 4-5 days.  Tylenol for headache.        Fulton Reek, PA 02/12/11 [place], PA 02/12/11 1210

## 2011-02-12 NOTE — ED Notes (Signed)
Patient c/o cough, sore throat, chest soreness/congestion, watery eyes, headache, upper abd pain, nausea, vomiting, and diarrhea since Monday night.

## 2011-02-12 NOTE — ED Notes (Signed)
Pt reports cough/congestion, and watery eyes since Monday night.  Pt denies any fever at home.  nad noted

## 2011-02-14 IMAGING — CR DG CHEST 1V PORT
1 series · 1 of 1 positions shown · non-contrast
Comparison: Chest radiograph performed 07/10/2009

CLINICAL DATA: Weakness; GI bleeding.  History of diabetes,
hypertension and anemia.

PORTABLE CHEST - 1 VIEW

[view not recorded]
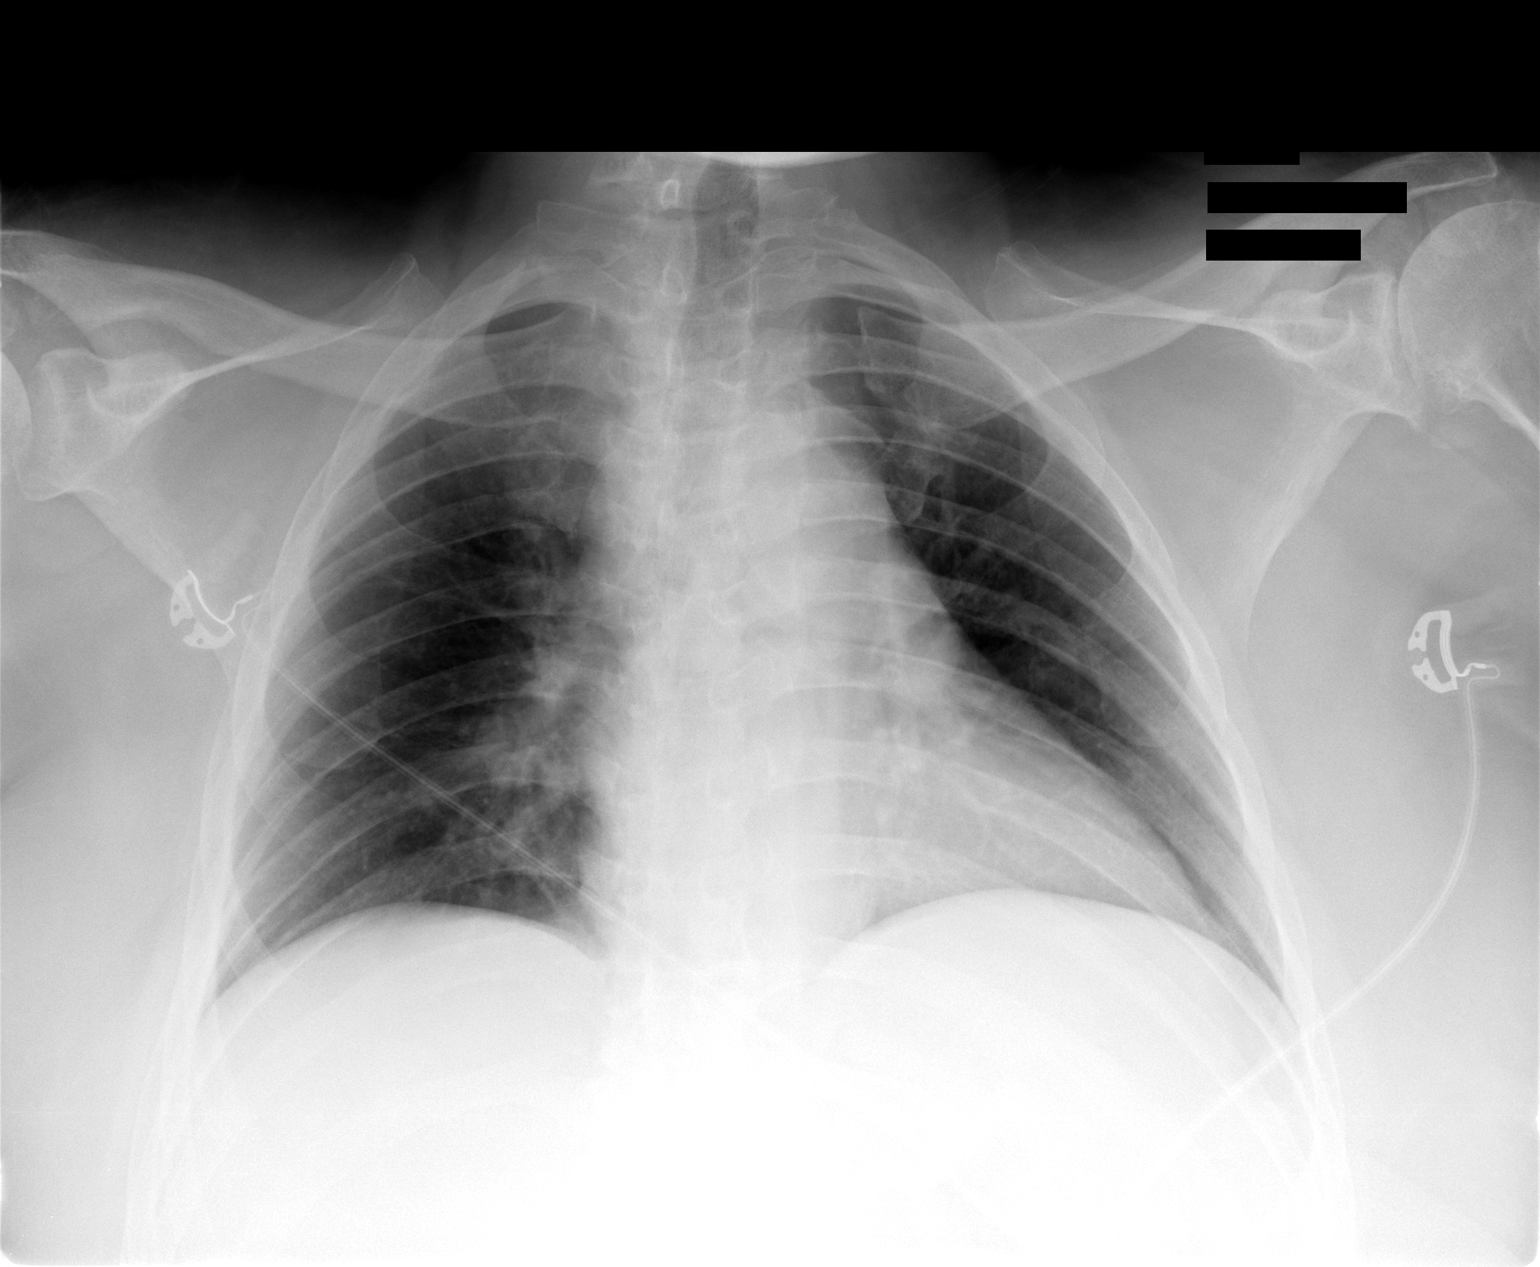

[1 of 1 positions shown; findings below may reference images not displayed]

FINDINGS: Lung expansion is improved; the lungs appear essentially
clear bilaterally.  No focal consolidation, pleural effusion or
pneumothorax is seen.

The cardiomediastinal silhouette is borderline enlarged.  No acute
osseous abnormalities are identified.
IMPRESSION: No acute cardiopulmonary process seen.

## 2011-02-14 NOTE — ED Provider Notes (Signed)
Medical screening examination/treatment/procedure(s) were performed by non-physician practitioner and as supervising physician I was immediately available for consultation/collaboration.  Jack Irwin. Olin Hauser, MD 02/14/11 [phone]

## 2011-02-15 IMAGING — CR DG KNEE 1-2V PORT*L*
2 series · 2 of 2 positions shown · non-contrast
Comparison: None

CLINICAL DATA: Gout, pain and swelling left knee

PORTABLE LEFT KNEE - 1-2 VIEW

[view not recorded (1 of 2)]
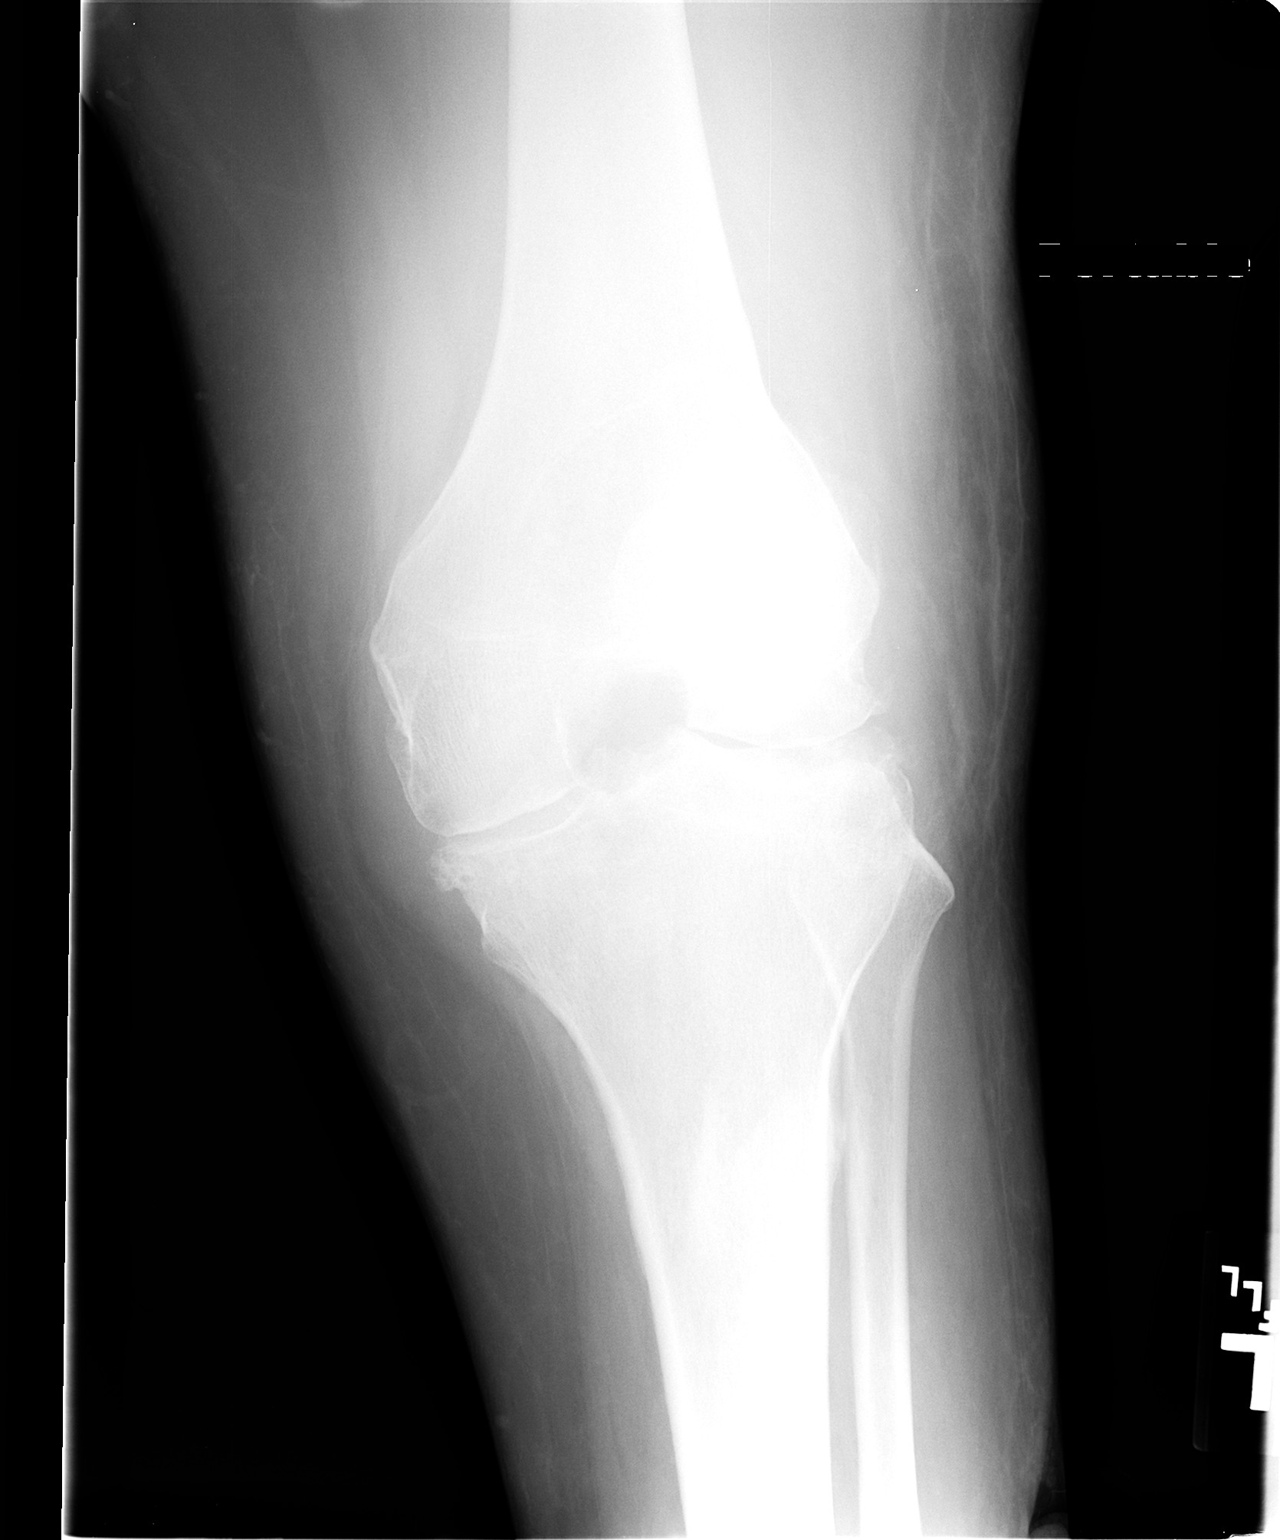

[view not recorded (2 of 2)]
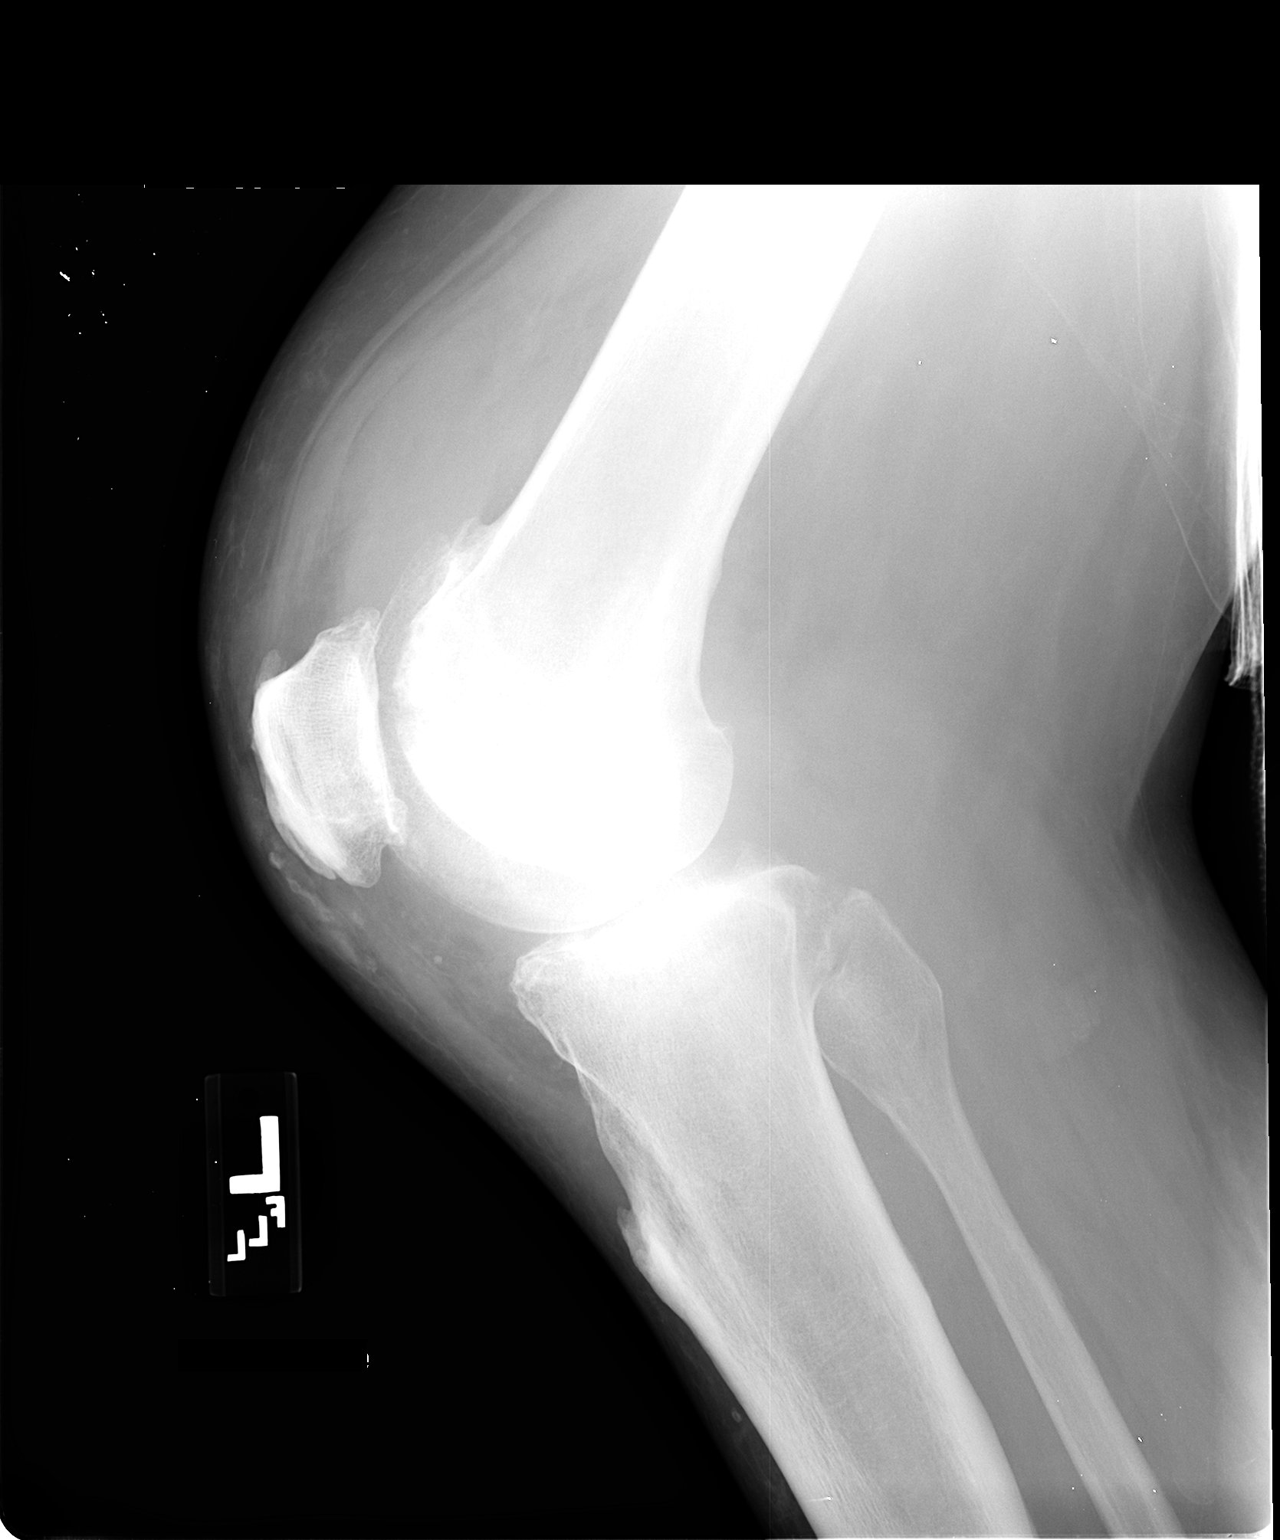

[2 of 2 positions shown; findings below may reference images not displayed]

FINDINGS: Mild bony demineralization.
Scattered joint space narrowing and spur formation, greatest at
medial compartment.
Patellar spurs at quadriceps and patellar tendon insertions.
Large knee joint effusion.
Question erosions at the anterior margin of lateral femoral
condyle.
No acute fracture or dislocation.
IMPRESSION: Degenerative changes left knee with large associated knee joint
effusion and question erosive changes at the anterior aspect of the
lateral femoral condyle, could be seen with gout.
Bony demineralization.

## 2011-02-20 IMAGING — CR DG CHEST 1V PORT
1 series · 1 of 1 positions shown · non-contrast
Comparison: 08/13/2009.

CLINICAL DATA: Rectal bleeding.  Elevated white blood cell count.
Gout arthritis.

PORTABLE CHEST - 1 VIEW

[view not recorded]
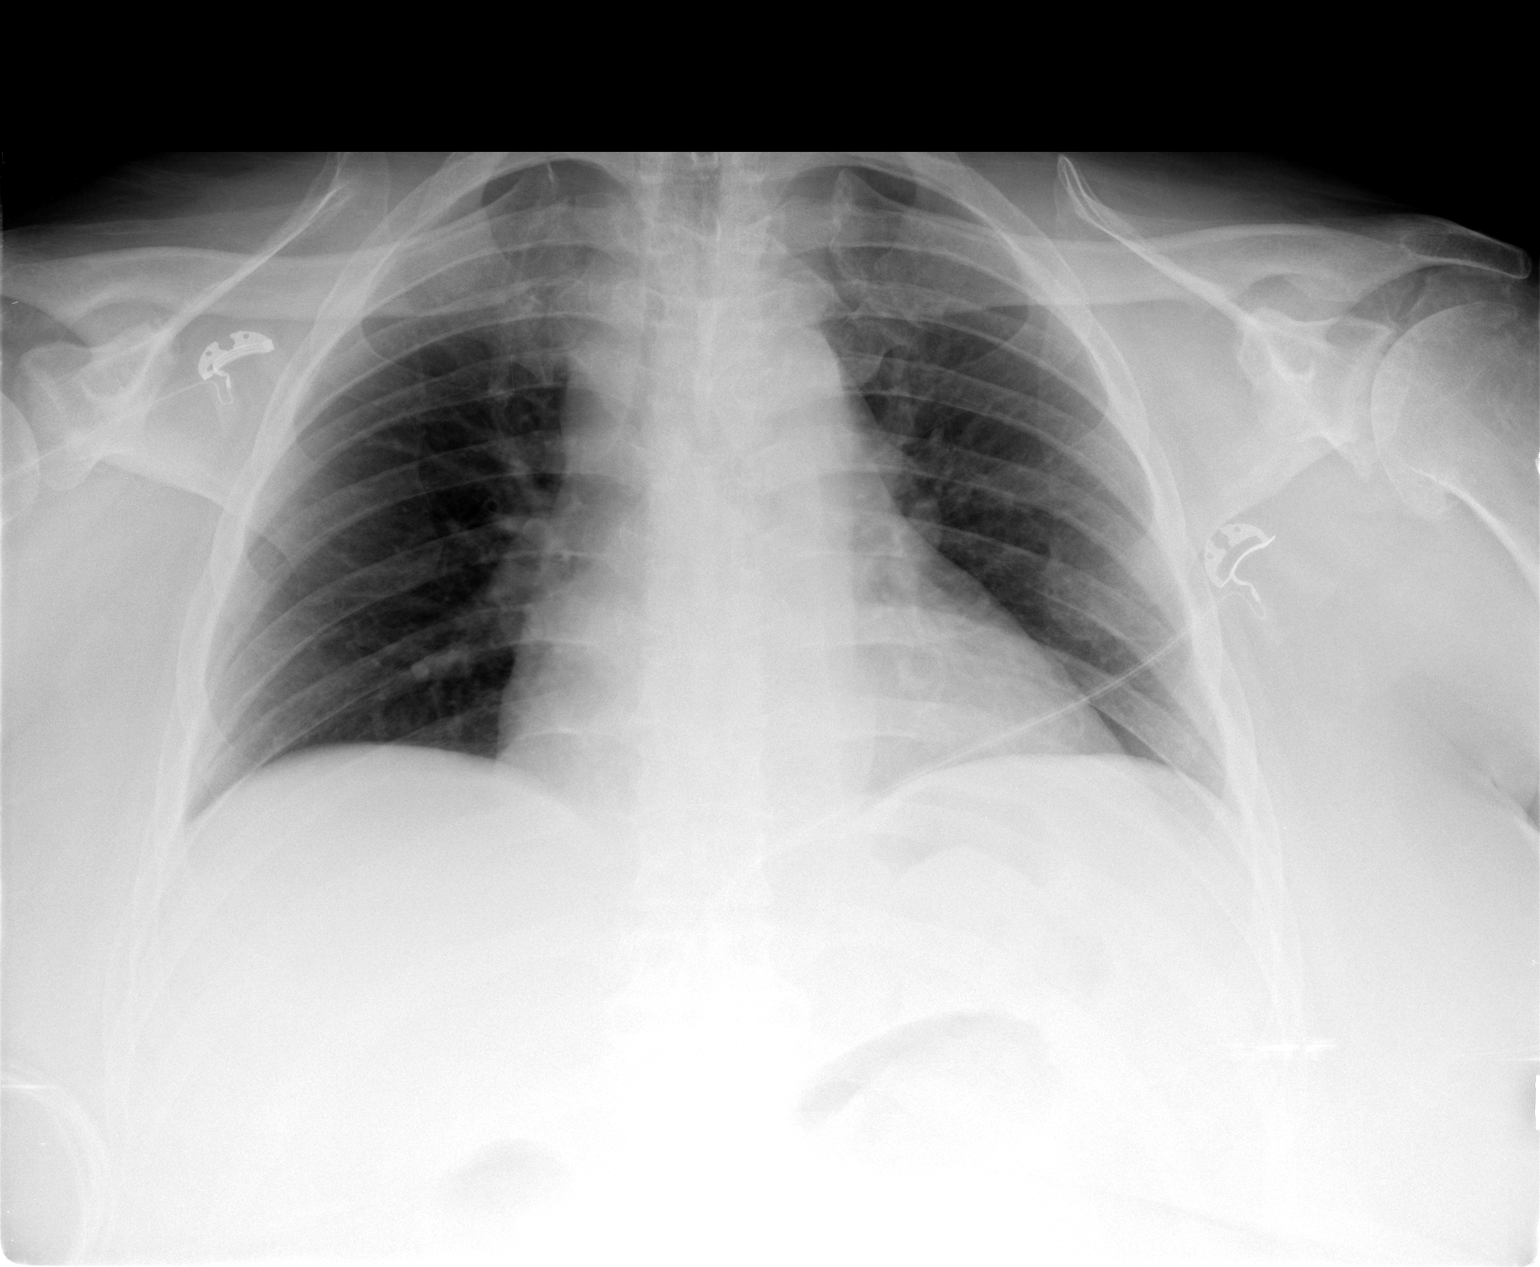

[1 of 1 positions shown; findings below may reference images not displayed]

FINDINGS: Cardiomegaly.  No edema or effusions.  Lung volumes
slightly low.  Trachea midline.  Mediastinal contours normal.
IMPRESSION: No active cardiopulmonary disease.  Cardiomegaly.

## 2011-03-15 IMAGING — CR DG CHEST 1V PORT
1 series · 1 of 1 positions shown · non-contrast
Comparison: Portable exam 5008 hours compared to 08/19/2009

CLINICAL DATA: Chest and upper back pain, renal failure,
hypertension

PORTABLE CHEST - 1 VIEW

[view not recorded]
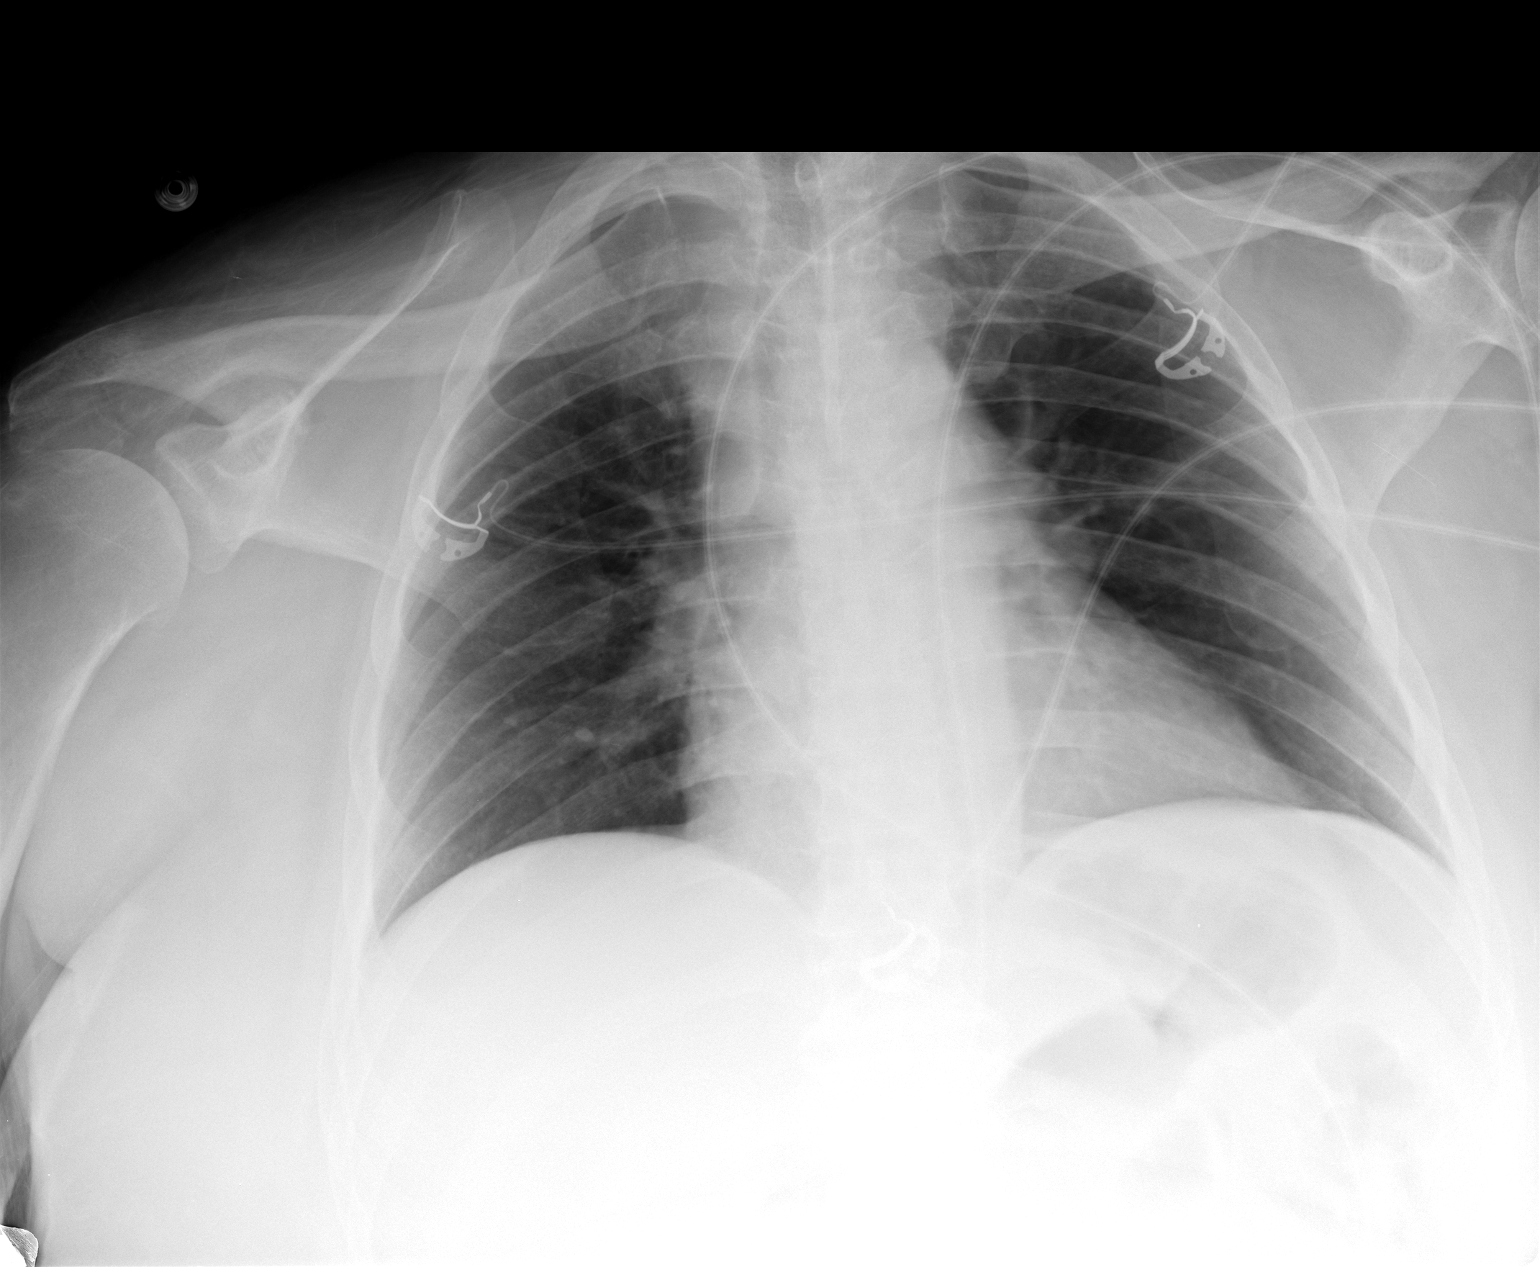

[1 of 1 positions shown; findings below may reference images not displayed]

FINDINGS: Borderline cardiac enlargement.
Normal mediastinal contours and pulmonary vascularity.
Lungs clear.
No pleural effusion or pneumothorax.
IMPRESSION: No acute abnormalities.

## 2011-03-17 IMAGING — US US EXTREM LOW VENOUS BILAT
1 series · 13 of 24 positions shown · non-contrast
Comparison: 02/19/2009

CLINICAL DATA: Bilateral leg pain and swelling, past history
rheumatoid arthritis and DVT

VENOUS DUPLEX ULTRASOUND OF BILATERAL LOWER EXTREMITIES
TECHNIQUE: Gray-scale sonography with graded compression, as well
as color Doppler and duplex ultrasound, were performed to evaluate
the deep venous system of both lower extremities from the level of
the common femoral vein through the popliteal and proximal calf
veins.  Spectral Doppler was utilized to evaluate flow at rest and
with distal augmentation maneuvers.

[Series 1: us extrem low venous bilat · 0.14mm/px · 13 of 75 slices shown]
[im 1/75]
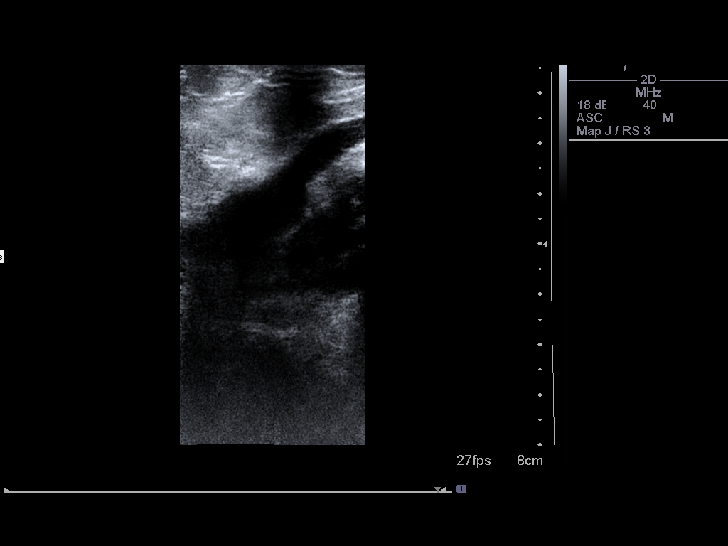
[im 7/75]
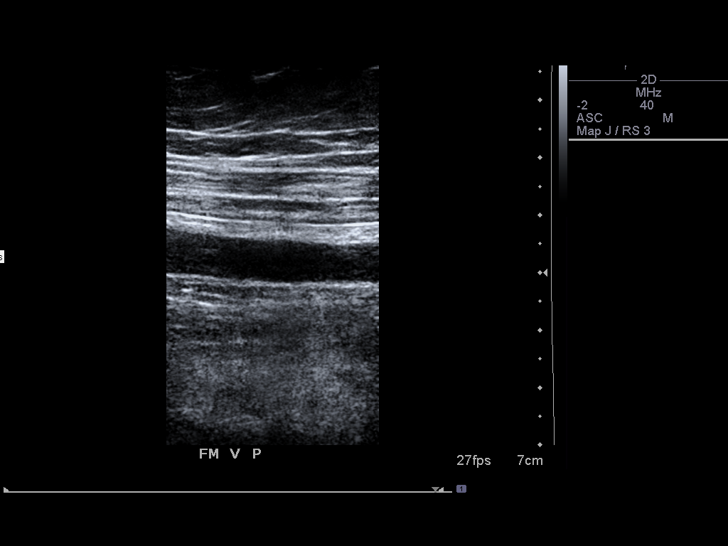
[im 13/75]
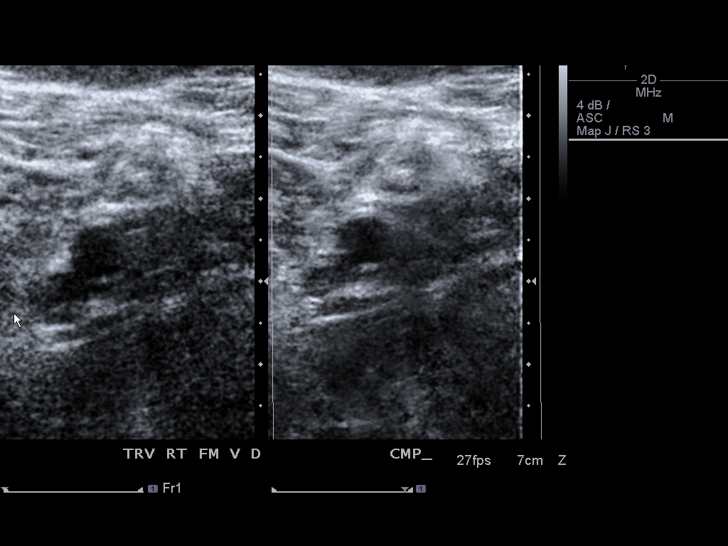
[im 20/75]
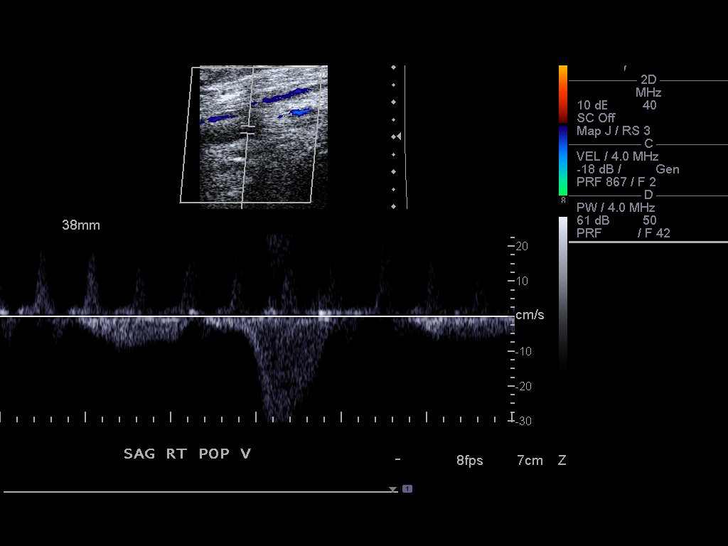
[im 26/75]
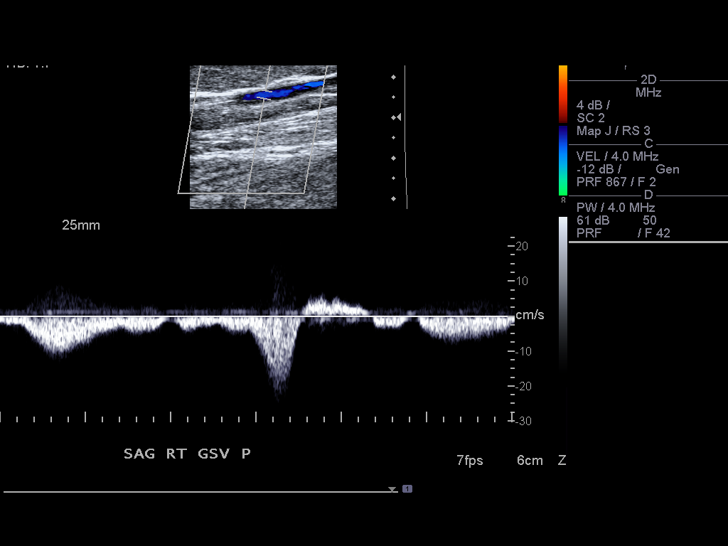
[im 33/75]
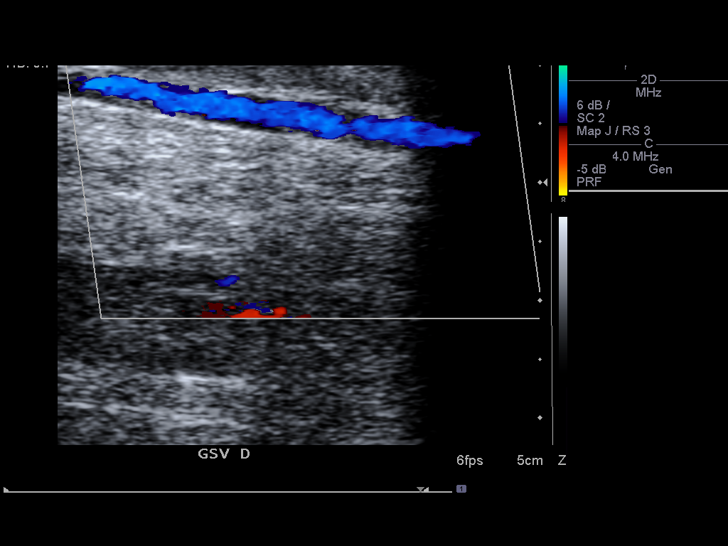
[im 39/75]
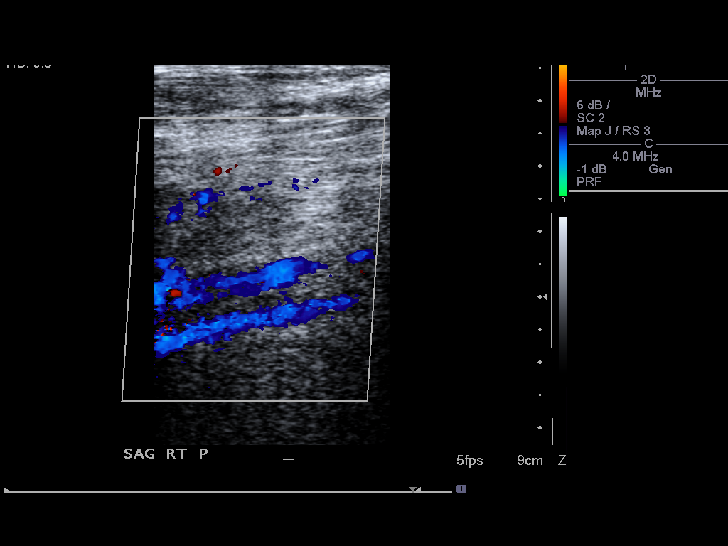
[im 42/75]
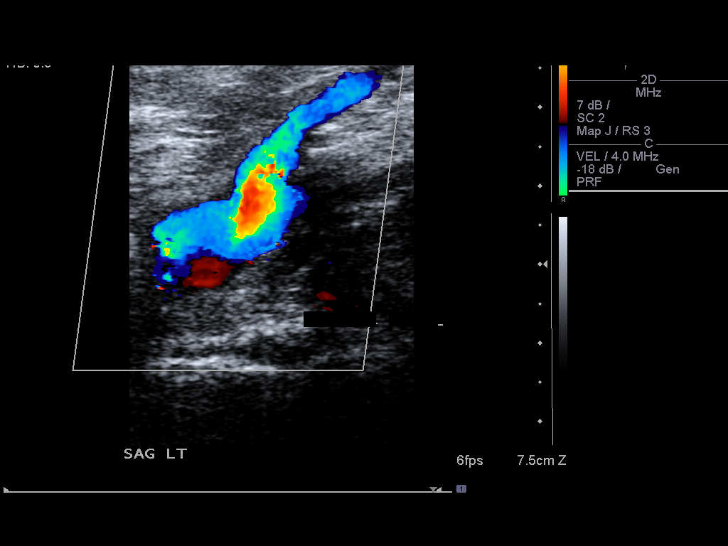
[im 49/75]
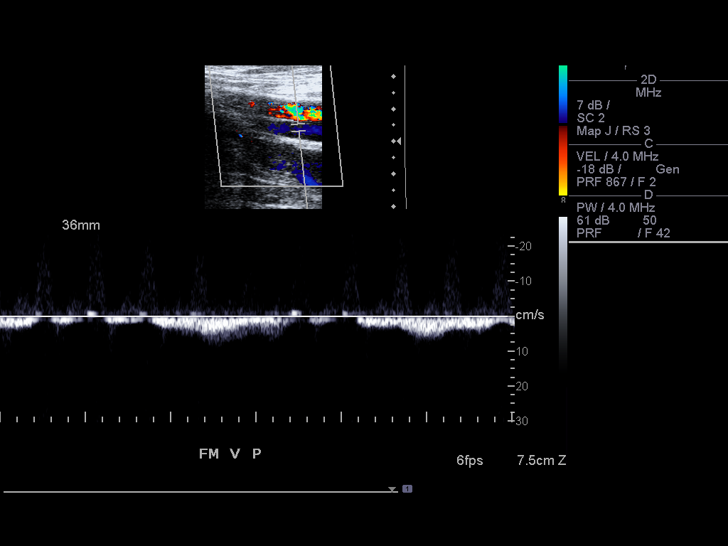
[im 55/75]
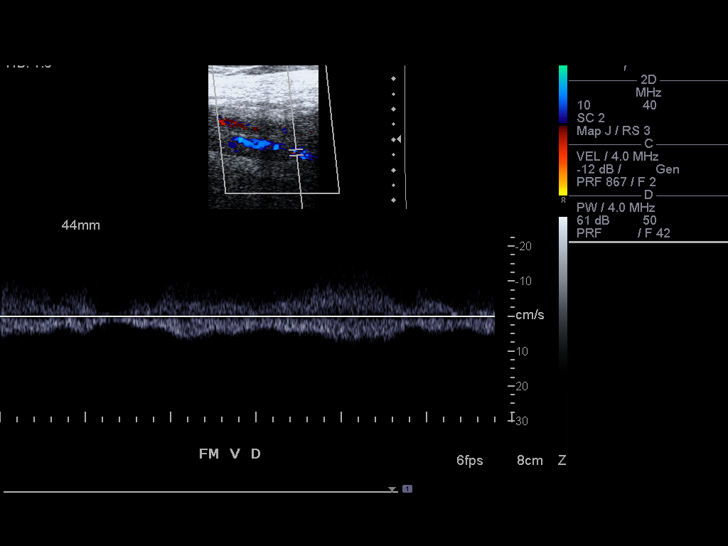
[im 62/75]
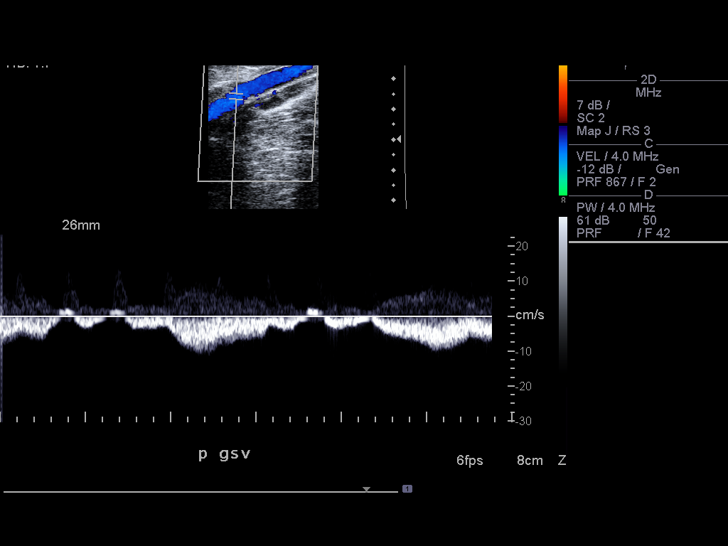
[im 68/75]
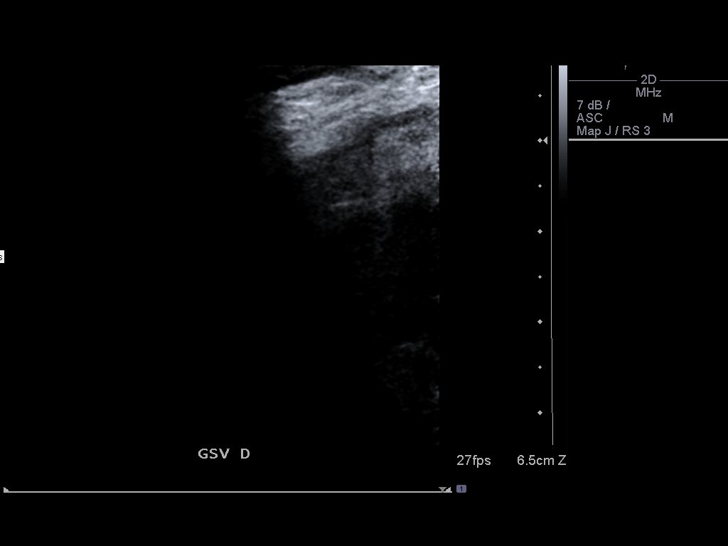
[im 75/75]
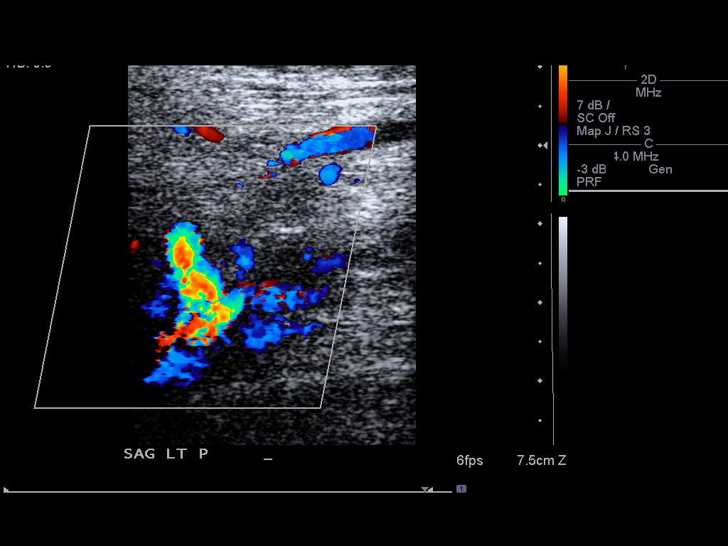

[13 of 24 positions shown; findings below may reference images not displayed]

FINDINGS: Right lower extremity:
Small amount of nonocclusive mural thrombus identified in right
lower extremity deep venous system at mid to distal femoral vein
and in popliteal vein.  Common femoral, profunda femoral, and
proximal femoral veins appear patent and compressible.  On color
Doppler imaging, impaired venous flow is seen at the mid to distal
femoral vein and popliteal vein.  Spontaneous venous flow is
present at remaining levels with intact augmentation and evidence
of respiratory phasicity.  Visualized portion of greater saphenous
system appears patent. Observed thrombus is age indeterminate, but
potentially old.

Left lower extremity:
Scattered areas of nonocclusive thrombus are seen at the left
common femoral vein, femoral vein and left popliteal vein.
Observed thrombus at these sites is small and eccentric, question
old. Spontaneous venous flow is present in the deep venous system
of the left lower extremity with mild respiratory phasicity.
Visualized portion of greater saphenous system appears patent.
IMPRESSION: Small areas of nonocclusive thrombus identified within the deep
venous systems of both lower extremities; these are age
indeterminate in character but based on appearance I would favor
these being old, particularly on left.
The patient had deep venous thrombosis within the right lower
extremity at the same levels on the previous exam of 02/19/2009,
but definitive thrombus was not seen in the left lower extremity at
that time.

## 2011-03-30 ENCOUNTER — Emergency Department (HOSPITAL_COMMUNITY)
Admission: EM | Admit: 2011-03-30 | Discharge: 2011-03-30 | Disposition: A | Discharge status: Home / Self Care | Payer: Self-pay | Attending: Emergency Medicine | Admitting: Emergency Medicine

## 2011-03-30 ENCOUNTER — Encounter (HOSPITAL_COMMUNITY): Payer: Self-pay

## 2011-03-30 DIAGNOSIS — M109 Gout, unspecified: Secondary | ICD-10-CM | POA: Insufficient documentation

## 2011-03-30 DIAGNOSIS — R300 Dysuria: Secondary | ICD-10-CM | POA: Insufficient documentation

## 2011-03-30 DIAGNOSIS — N289 Disorder of kidney and ureter, unspecified: Secondary | ICD-10-CM

## 2011-03-30 DIAGNOSIS — R339 Retention of urine, unspecified: Secondary | ICD-10-CM | POA: Insufficient documentation

## 2011-03-30 DIAGNOSIS — I10 Essential (primary) hypertension: Secondary | ICD-10-CM | POA: Insufficient documentation

## 2011-03-30 DIAGNOSIS — M7989 Other specified soft tissue disorders: Secondary | ICD-10-CM | POA: Insufficient documentation

## 2011-03-30 DIAGNOSIS — R42 Dizziness and giddiness: Secondary | ICD-10-CM | POA: Insufficient documentation

## 2011-03-30 DIAGNOSIS — H109 Unspecified conjunctivitis: Secondary | ICD-10-CM | POA: Insufficient documentation

## 2011-03-30 DIAGNOSIS — N184 Chronic kidney disease, stage 4 (severe): Secondary | ICD-10-CM | POA: Insufficient documentation

## 2011-03-30 DIAGNOSIS — I129 Hypertensive chronic kidney disease with stage 1 through stage 4 chronic kidney disease, or unspecified chronic kidney disease: Secondary | ICD-10-CM | POA: Insufficient documentation

## 2011-03-30 DIAGNOSIS — E119 Type 2 diabetes mellitus without complications: Secondary | ICD-10-CM | POA: Insufficient documentation

## 2011-03-30 LAB — DIFFERENTIAL
Eosinophils Relative: 2 % (ref 0–5)
Lymphocytes Relative: 20 % (ref 12–46)
Lymphs Abs: 1.6 10*3/uL (ref 0.7–4.0)
Monocytes Relative: 8 % (ref 3–12)
Neutrophils Relative %: 70 % (ref 43–77)

## 2011-03-30 LAB — CBC
Hemoglobin: 10.8 g/dL — ABNORMAL LOW (ref 13.0–17.0)
MCV: 86.3 fL (ref 78.0–100.0)
Platelets: 262 10*3/uL (ref 150–400)
RBC: 3.88 MIL/uL — ABNORMAL LOW (ref 4.22–5.81)
WBC: 7.7 10*3/uL (ref 4.0–10.5)

## 2011-03-30 LAB — BASIC METABOLIC PANEL
BUN: 41 mg/dL — ABNORMAL HIGH (ref 6–23)
CO2: 24 mEq/L (ref 19–32)
Glucose, Bld: 161 mg/dL — ABNORMAL HIGH (ref 70–99)
Potassium: 4.6 mEq/L (ref 3.5–5.1)
Sodium: 135 mEq/L (ref 135–145)

## 2011-03-30 LAB — URINALYSIS, ROUTINE W REFLEX MICROSCOPIC
Glucose, UA: NEGATIVE mg/dL
Hgb urine dipstick: NEGATIVE
Specific Gravity, Urine: 1.015 (ref 1.005–1.030)

## 2011-03-30 LAB — URINE MICROSCOPIC-ADD ON

## 2011-03-30 MED ORDER — OXYCODONE-ACETAMINOPHEN 5-325 MG PO TABS
1.0000 | ORAL_TABLET | Freq: Once | ORAL | Status: AC
  Administered 2011-03-30: 1 via ORAL
  Filled 2011-03-30: qty 1

## 2011-03-30 MED ORDER — SULFACETAMIDE SODIUM 10 % OP SOLN: 1.0000 [drp] | OPHTHALMIC | Status: AC

## 2011-03-30 NOTE — ED Provider Notes (Signed)
This chart was scribed for No att. providers found by Zella Ball. The patient was seen in room [facility] at 3:11 PM.  CSN: [id number]  Arrival date & time 03/30/11  1445   First MD Initiated Contact with Patient 03/30/11 1504      Chief Complaint  Patient presents with  . Urinary Retention  . Dizziness  . Leg Swelling    (Consider location/radiation/quality/duration/timing/severity/associated sxs/prior treatment) Patient is a 46 y.o. male presenting with dysuria. The history is provided by the patient.  Dysuria  The current episode started 2 days ago. The problem occurs every urination. The problem has been gradually worsening. The quality of the pain is described as burning. The pain is moderate. There has been no fever. Pertinent negatives include no vomiting and no frequency. He has tried nothing for the symptoms.   BROC PARKS is a 46 y.o. male with a history of gout, arthritis, and stage 4 renal failure who presents to the Emergency Department complaining of urinary retention and dysuria for 2 days. Pt reports voiding less than usual, once today. Pt had shaking spell and lost consciousness for about 2 minutes on Tuesday witnessed by family member. Pt suspects seizure but denies any history of seizures. PCP-Dr. [name] Past Medical History  Diagnosis Date  . Hypertension   . Renal insufficiency   . Diabetes mellitus   . Gout   . Chronic back pain     Past Surgical History  Procedure Date  . None     Family History  Problem Relation Age of Onset  . Diabetes Mother   . Hypertension Mother   . Heart failure Mother   . Hyperlipidemia Mother   . Cancer Father   . Diabetes Father   . Hypertension Father   . Hyperlipidemia Father     History  Substance Use Topics  . Smoking status: Never Smoker   . Smokeless tobacco: Never Used  . Alcohol Use: No      Review of Systems  Gastrointestinal: Negative for vomiting.  Genitourinary: Positive for dysuria.  Negative for frequency.   10 Systems reviewed and are negative for acute change except as noted in the HPI.  Allergies  Review of patient's allergies indicates no known allergies.  Home Medications   Current Outpatient Rx  Name Route Sig Dispense Refill  . AMITRIPTYLINE HCL 25 MG PO TABS Oral Take 25 mg by mouth at bedtime.      [name] CALCIUM ACETATE 667 MG PO CAPS Oral Take 667 mg by mouth daily.     . COLCHICINE 0.6 MG PO TABS Oral Take 0.6 mg by mouth daily as needed. For gout    . CORICIDIN HBP FLU PO Oral Take 1 tablet by mouth daily.    . FEBUXOSTAT 80 MG PO TABS Oral Take 1 tablet by mouth daily.    [name] HYDROCODONE-ACETAMINOPHEN 5-325 MG PO TABS Oral Take 1 tablet by mouth every 6 (six) hours as needed. Pain    . LOSARTAN POTASSIUM 25 MG PO TABS Oral Take 25 mg by mouth daily.    [name] METOPROLOL TARTRATE 50 MG PO TABS Oral Take 50 mg by mouth 2 (two) times daily.      . OXYCODONE-ACETAMINOPHEN 5-325 MG PO TABS Oral Take 1 tablet by mouth daily as needed. For pain    . PREDNISONE 10 MG PO TABS Oral Take 10 mg by mouth daily.    . SULFACETAMIDE SODIUM 10 % OP SOLN Right Eye Place 1-2 drops into the  right eye every 4 (four) hours. 10 mL 0  . VARDENAFIL HCL 20 MG PO TABS Oral Take 20 mg by mouth daily as needed. For sexual intercourse      BP 134/86  Pulse 107  Temp(Src) 97.9 F (36.6 C) (Oral)  Resp 20  Ht 5\' 11"  (1.803 m)  Wt 302 lb (136.986 kg)  BMI 42.12 kg/m2  SpO2 100%  Physical Exam  Nursing note and vitals reviewed. Constitutional: He is oriented to person, place, and time. He appears well-developed and well-nourished.  HENT:  Head: Normocephalic and atraumatic.  Eyes: Right eye exhibits chemosis and discharge (Mild amount of clear drainage right eye). Left conjunctiva is injected.       Chemosis of the lateral bulbar conjunctiva right eye  Cardiovascular: Normal rate, regular rhythm and normal heart sounds.   Pulmonary/Chest: Effort normal and breath sounds normal.    Abdominal: Soft. There is tenderness.       Bilateral lower quadrant tenderness Spine is nontender  Genitourinary: Penis normal.       Testicles normal Left suprapubic testicle- 1 cm draining abscess   Musculoskeletal: He exhibits edema (2+ edema of lower extremities) and tenderness.       Joint deformites of the hands bilaterally Gouty tophi  Knees are swollen and tender bilaterally Bilateral swelling of the first MTP joints  Neurological: He is alert and oriented to person, place, and time.  Skin: Skin is warm and dry.  Psychiatric: He has a normal mood and affect. His behavior is normal.    ED Course  Procedures (including critical care time) 5:34 PM Recheck: Pt notified of lab results. Pt has conjunctivitis but was prescribed eye drops and is ready for discharge.   DIAGNOSTIC STUDIES: Oxygen Saturation is 100% on room air, normal by my interpretation.    COORDINATION OF CARE:  Medications  sulfacetamide (BLEPH-10) 10 % ophthalmic solution (not administered)  oxyCODONE-acetaminophen (PERCOCET) 5-325 MG per tablet 1 tablet (1 tablet Oral Given 03/30/11 1606)      Labs Reviewed  CBC - Abnormal; Notable for the following:    RBC 3.88 (*)    Hemoglobin 10.8 (*)    HCT 33.5 (*)    RDW 16.8 (*)    All other components within normal limits  BASIC METABOLIC PANEL - Abnormal; Notable for the following:    Glucose, Bld 161 (*)    BUN 41 (*)    Creatinine, Ser 4.61 (*)    GFR calc non Af Amer 14 (*)    GFR calc Af Amer 17 (*)    All other components within normal limits  URINALYSIS, ROUTINE W REFLEX MICROSCOPIC - Abnormal; Notable for the following:    Protein, ur 30 (*)    All other components within normal limits  DIFFERENTIAL  URINE MICROSCOPIC-ADD ON  URINE CULTURE   Component     Latest Ref Rng 10/12/2010 02/12/2011 03/30/2011  Sodium     135 - 145 mEq/L 134 (L) 140 135  Potassium     3.5 - 5.1 mEq/L 4.2 3.9 4.6  Chloride     96 - 112 mEq/L 101 110 101  CO2      19 - 32 mEq/L 23 25 24   Glucose     70 - 99 mg/dL 216 (H) 120 (H) 161 (H)  BUN     6 - 23 mg/dL 33 (H) 33 (H) 41 (H)  Creat     0.50 - 1.35 mg/dL 2.74 (H) 2.82 (H) 4.61 (H)  Calcium     8.4 - 10.5 mg/dL 8.9 8.7 9.3  GFR calc non Af Amer     >90 mL/min 25 (L) 26 (L) 14 (L)  GFR calc Af Amer     >90 mL/min 31 (L) 30 (L) 17 (L)    Emergency department treatment: Percocet by mouth. Pt was Treated in ED with analgesic medications with improvement; labs and imaging reviewed and considered in diagnostic decision making.  No results found.   1. Conjunctivitis of right eye   2. Renal insufficiency   3. Gout       MDM  Emergency department evaluation is negative for UTI. His creatinine has elevated from baseline and the potassium is normal. This likely explains his decreased urination. His oxygenation is 100% on room air. The pt already has a f/u appt. With his Nephrologist on 04/01/11. No additional interventions necessary at this time.  I personally performed the services described in this documentation, which was scribed in my presence. The recorded information has been reviewed and considered.         Richarda Blade, MD 03/31/11 1755

## 2011-03-30 NOTE — Discharge Instructions (Signed)
Use moist compress on the right eye to help the drainage. See your primary care Dr. As scheduled.  Conjunctivitis Conjunctivitis is commonly called "pink eye." Conjunctivitis can be caused by bacterial or viral infection, allergies, or injuries. There is usually redness of the lining of the eye, itching, discomfort, and sometimes discharge. There may be deposits of matter along the eyelids. A viral infection usually causes a watery discharge, while a bacterial infection causes a yellowish, thick discharge. Pink eye is very contagious and spreads by direct contact. You may be given antibiotic eyedrops as part of your treatment. Before using your eye medicine, remove all drainage from the eye by washing gently with warm water and cotton balls. Continue to use the medication until you have awakened 2 mornings in a row without discharge from the eye. Do not rub your eye. This increases the irritation and helps spread infection. Use separate towels from other household members. Wash your hands with soap and water before and after touching your eyes. Use cold compresses to reduce pain and sunglasses to relieve irritation from light. Do not wear contact lenses or wear eye makeup until the infection is gone. SEEK MEDICAL CARE IF:   Your symptoms are not better after 3 days of treatment.   You have increased pain or trouble seeing.   The outer eyelids become very red or swollen.  Document Released: 02/28/2004 Document Revised: 10/02/2010 Document Reviewed: 01/20/2005 [facility] Patient Information 2012 [place].Gout Gout is an inflammatory condition (arthritis) caused by a buildup of uric acid crystals in the joints. Uric acid is a chemical that is normally present in the blood. Under some circumstances, uric acid can form into crystals in your joints. This causes joint redness, soreness, and swelling (inflammation). Repeat attacks are common. Over time, uric acid crystals can form into masses (tophi) near  a joint, causing disfigurement. Gout is treatable and often preventable. CAUSES  The disease begins with elevated levels of uric acid in the blood. Uric acid is produced by your body when it breaks down a naturally found substance called purines. This also happens when you eat certain foods such as meats and fish. Causes of an elevated uric acid level include:  Being passed down from parent to child (heredity).   Diseases that cause increased uric acid production (obesity, psoriasis, some cancers).   Excessive alcohol use.   Diet, especially diets rich in meat and seafood.   Medicines, including certain cancer-fighting drugs (chemotherapy), diuretics, and aspirin.   Chronic kidney disease. The kidneys are no longer able to remove uric acid well.   Problems with metabolism.  Conditions strongly associated with gout include:  Obesity.   High blood pressure.   High cholesterol.   Diabetes.  Not everyone with elevated uric acid levels gets gout. It is not understood why some people get gout and others do not. Surgery, joint injury, and eating too much of certain foods are some of the factors that can lead to gout. SYMPTOMS   An attack of gout comes on quickly. It causes intense pain with redness, swelling, and warmth in a joint.   Fever can occur.   Often, only one joint is involved. Certain joints are more commonly involved:   Base of the big toe.   Knee.   Ankle.   Wrist.   Finger.  Without treatment, an attack usually goes away in a few days to weeks. Between attacks, you usually will not have symptoms, which is different from many other forms of  arthritis. DIAGNOSIS  Your caregiver will suspect gout based on your symptoms and exam. Removal of fluid from the joint (arthrocentesis) is done to check for uric acid crystals. Your caregiver will give you a medicine that numbs the area (local anesthetic) and use a needle to remove joint fluid for exam. Gout is confirmed when  uric acid crystals are seen in joint fluid, using a special microscope. Sometimes, blood, urine, and X-ray tests are also used. TREATMENT  There are 2 phases to gout treatment: treating the sudden onset (acute) attack and preventing attacks (prophylaxis). Treatment of an Acute Attack  Medicines are used. These include anti-inflammatory medicines or steroid medicines.   An injection of steroid medicine into the affected joint is sometimes necessary.   The painful joint is rested. Movement can worsen the arthritis.   You may use warm or cold treatments on painful joints, depending which works best for you.   Discuss the use of coffee, vitamin C, or cherries with your caregiver. These may be helpful treatment options.  Treatment to Prevent Attacks After the acute attack subsides, your caregiver may advise prophylactic medicine. These medicines either help your kidneys eliminate uric acid from your body or decrease your uric acid production. You may need to stay on these medicines for a very long time. The early phase of treatment with prophylactic medicine can be associated with an increase in acute gout attacks. For this reason, during the first few months of treatment, your caregiver may also advise you to take medicines usually used for acute gout treatment. Be sure you understand your caregiver's directions. You should also discuss dietary treatment with your caregiver. Certain foods such as meats and fish can increase uric acid levels. Other foods such as dairy can decrease levels. Your caregiver can give you a list of foods to avoid. HOME CARE INSTRUCTIONS   Do not take aspirin to relieve pain. This raises uric acid levels.   Only take over-the-counter or prescription medicines for pain, discomfort, or fever as directed by your caregiver.   Rest the joint as much as possible. When in bed, keep sheets and blankets off painful areas.   Keep the affected joint raised (elevated).   Use  crutches if the painful joint is in your leg.   Drink enough water and fluids to keep your urine clear or pale yellow. This helps your body get rid of uric acid. Do not drink alcoholic beverages. They slow the passage of uric acid.   Follow your caregiver's dietary instructions. Pay careful attention to the amount of protein you eat. Your daily diet should emphasize fruits, vegetables, whole grains, and fat-free or low-fat milk products.   Maintain a healthy body weight.  SEEK MEDICAL CARE IF:   You have an oral temperature above 102 F (38.9 C).   You develop diarrhea, vomiting, or any side effects from medicines.   You do not feel better in 24 hours, or you are getting worse.  SEEK IMMEDIATE MEDICAL CARE IF:   Your joint becomes suddenly more tender and you have:   Chills.   An oral temperature above 102 F (38.9 C), not controlled by medicine.  MAKE SURE YOU:   Understand these instructions.   Will watch your condition.   Will get help right away if you are not doing well or get worse.  Document Released: 01/18/2000 Document Revised: 10/02/2010 Document Reviewed: 04/30/2009 [facility] Patient Information 2012 [place].Kidney Failure Kidney failure happens when the kidneys cannot remove waste and  excess fluid that naturally builds up in your blood after your body breaks down food. This leads to a dangerous buildup of waste products and fluid in the blood. HOME CARE  Follow your diet as told by your doctor.   Take all medicines as told by your doctor.   Keep all of your dialysis appointments. Call if you are unable to keep an appointment.  GET HELP RIGHT AWAY IF:   You make a lot more or very little pee (urine).   Your face or ankles puff up (swell).   You develop shortness of breath.   You develop weakness, feel tired, or you do not feel hungry (appetite loss).   You feel poorly for no known reason.  MAKE SURE YOU:   Understand these instructions.   Will  watch your condition.   Will get help right away if you are not doing well or get worse.  Document Released: 04/16/2009 Document Revised: 10/02/2010 Document Reviewed: 05/23/2009 [facility] Patient Information 2012 [place].

## 2011-03-30 NOTE — ED Notes (Signed)
Pt reports has had pain with urination, leg swelling, and dizziness for past few days.  Pt also says is concerned he may have had a seizure Tuesday.  Says he isn't sure.  Says a family member told him he was laughing, shaking, and eyes rolled in the back of his head.  Denies history of seizures.

## 2011-03-30 NOTE — ED Notes (Signed)
Patient with no complaints at this time. Respirations even and unlabored. Skin warm/dry. Discharge instructions reviewed with patient at this time. Patient given opportunity to voice concerns/ask questions. Patient discharged at this time and left Emergency Department with steady gait.   

## 2011-04-01 LAB — URINE CULTURE
Colony Count: 3000
Culture  Setup Time: 201302242119

## 2011-04-19 IMAGING — CR DG CHEST 1V PORT
1 series · 1 of 1 positions shown · non-contrast
Comparison: September 11, 2009

CLINICAL DATA: Pain

PORTABLE CHEST - 1 VIEW

[view not recorded]
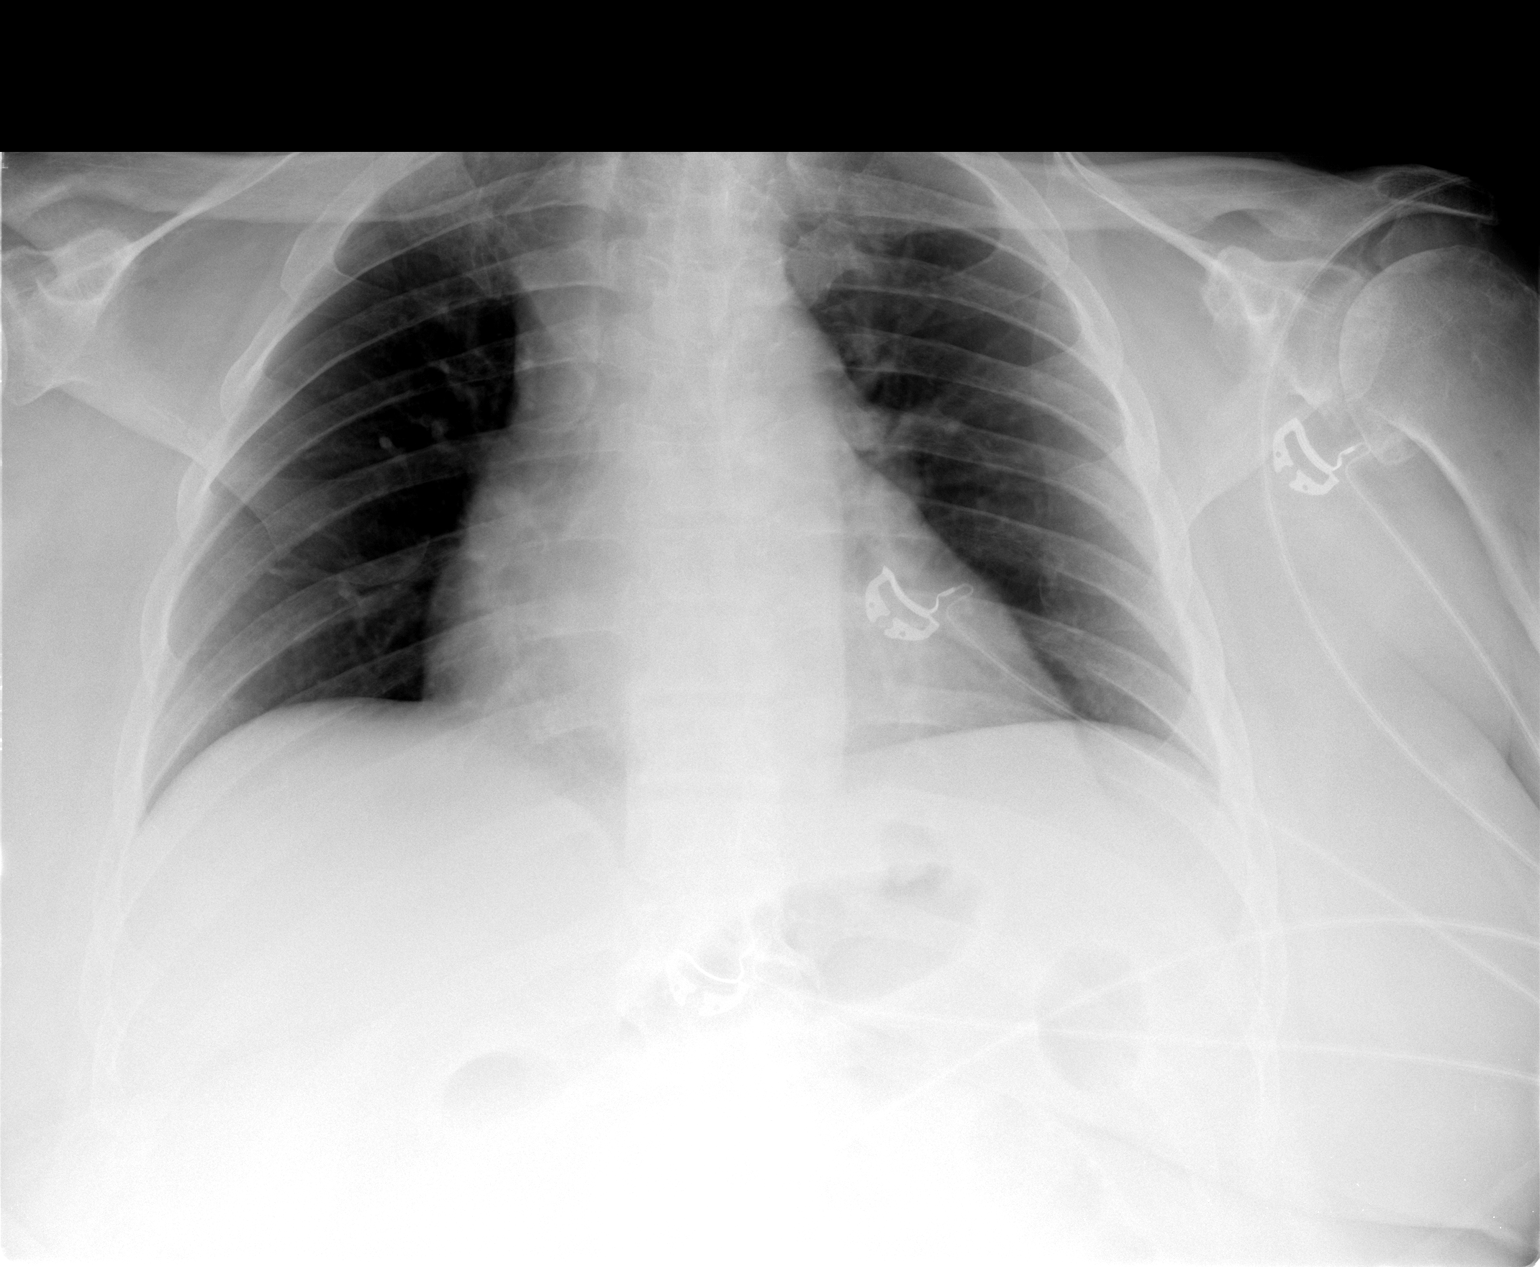

[1 of 1 positions shown; findings below may reference images not displayed]

FINDINGS: The cardiac silhouette, mediastinum, pulmonary
vasculature are within normal limits.  Both lungs are clear.
There is no acute bony abnormality
IMPRESSION: .  Stable portable chest with no evidence of acute cardiac or
pulmonary process.

## 2011-05-01 ENCOUNTER — Emergency Department (HOSPITAL_COMMUNITY)
Admission: EM | Admit: 2011-05-01 | Discharge: 2011-05-01 | Disposition: A | Discharge status: Home / Self Care | Payer: Self-pay | Attending: Emergency Medicine | Admitting: Emergency Medicine

## 2011-05-01 ENCOUNTER — Ambulatory Visit: Payer: Medicaid Other | Admitting: Vascular Surgery

## 2011-05-01 ENCOUNTER — Encounter (HOSPITAL_COMMUNITY): Payer: Self-pay | Admitting: *Deleted

## 2011-05-01 DIAGNOSIS — R110 Nausea: Secondary | ICD-10-CM | POA: Insufficient documentation

## 2011-05-01 DIAGNOSIS — R002 Palpitations: Secondary | ICD-10-CM | POA: Insufficient documentation

## 2011-05-01 DIAGNOSIS — I129 Hypertensive chronic kidney disease with stage 1 through stage 4 chronic kidney disease, or unspecified chronic kidney disease: Secondary | ICD-10-CM | POA: Insufficient documentation

## 2011-05-01 DIAGNOSIS — R109 Unspecified abdominal pain: Secondary | ICD-10-CM | POA: Insufficient documentation

## 2011-05-01 DIAGNOSIS — E119 Type 2 diabetes mellitus without complications: Secondary | ICD-10-CM | POA: Insufficient documentation

## 2011-05-01 DIAGNOSIS — Z86718 Personal history of other venous thrombosis and embolism: Secondary | ICD-10-CM | POA: Insufficient documentation

## 2011-05-01 DIAGNOSIS — N184 Chronic kidney disease, stage 4 (severe): Secondary | ICD-10-CM | POA: Insufficient documentation

## 2011-05-01 DIAGNOSIS — R42 Dizziness and giddiness: Secondary | ICD-10-CM | POA: Insufficient documentation

## 2011-05-01 DIAGNOSIS — R300 Dysuria: Secondary | ICD-10-CM

## 2011-05-01 DIAGNOSIS — Z8639 Personal history of other endocrine, nutritional and metabolic disease: Secondary | ICD-10-CM | POA: Insufficient documentation

## 2011-05-01 DIAGNOSIS — Z862 Personal history of diseases of the blood and blood-forming organs and certain disorders involving the immune mechanism: Secondary | ICD-10-CM | POA: Insufficient documentation

## 2011-05-01 DIAGNOSIS — Z79899 Other long term (current) drug therapy: Secondary | ICD-10-CM | POA: Insufficient documentation

## 2011-05-01 HISTORY — DX: Benign prostatic hyperplasia without lower urinary tract symptoms: N40.0

## 2011-05-01 HISTORY — DX: Acute embolism and thrombosis of unspecified deep veins of unspecified lower extremity: I82.409

## 2011-05-01 HISTORY — DX: Anemia, unspecified: D64.9

## 2011-05-01 HISTORY — DX: Type 2 diabetes mellitus with diabetic neuropathy, unspecified: E11.40

## 2011-05-01 HISTORY — DX: Venous insufficiency (chronic) (peripheral): I87.2

## 2011-05-01 LAB — URINALYSIS, ROUTINE W REFLEX MICROSCOPIC
Bilirubin Urine: NEGATIVE
Glucose, UA: NEGATIVE mg/dL
Ketones, ur: NEGATIVE mg/dL
Urobilinogen, UA: 0.2 mg/dL (ref 0.0–1.0)
pH: 5.5 (ref 5.0–8.0)

## 2011-05-01 LAB — BASIC METABOLIC PANEL
BUN: 50 mg/dL — ABNORMAL HIGH (ref 6–23)
Chloride: 104 mEq/L (ref 96–112)
Creatinine, Ser: 3.08 mg/dL — ABNORMAL HIGH (ref 0.50–1.35)
GFR calc non Af Amer: 23 mL/min — ABNORMAL LOW (ref >90)
Glucose, Bld: 94 mg/dL (ref 70–99)
Potassium: 4.5 mEq/L (ref 3.5–5.1)

## 2011-05-01 LAB — URINE MICROSCOPIC-ADD ON

## 2011-05-01 MED ORDER — ACETAMINOPHEN 500 MG PO TABS
1000.0000 mg | ORAL_TABLET | Freq: Once | ORAL | Status: AC
  Administered 2011-05-01: 1000 mg via ORAL
  Filled 2011-05-01: qty 2

## 2011-05-01 NOTE — ED Notes (Signed)
Unable to urinate since this am.  Says his heart has been speeding at times then slowing down.

## 2011-05-01 NOTE — Discharge Instructions (Signed)
Dysuria You have dysuria. This is pain on urination. Dysuria is often present with other symptoms such as:  A sudden urge to go.   Having to go more often.  Dysuria can be caused by:  Urinary tract infections.   Yeast infections.   Prostate problems.   Urinary stones.   Sexually transmitted diseases.  Lab tests of the urine will usually be needed to confirm a urinary infection. An infection is the cause of dysuria in over half the cases. In older men the prostate gland enlarges and can cause urinary problems. These include:   Urinary obstruction.   Infection.   Pain on urination.  Bladder cancer can also cause blood in the urine and dysuria. If you have an infection, be sure to take the antibiotics prescribed for you until they are gone. This will help prevent a recurrence. Further checking by a specialist may be needed if the cause of your dysuria is not found. Cystoscopy, x-rays, pelvic exams, and special cultures may be needed to find the cause and help find the best treatment. See your caregiver right away if your symptoms are not improved after three days.  SEEK IMMEDIATE MEDICAL CARE IF:  You have difficulty urinating, pass bloody urine, or have chills or a fever. Document Released: 01/20/2005 Document Revised: 01/09/2011 Document Reviewed: 07/07/2006 [facility] Patient Information 2012 [place].Dysuria You have dysuria. This is pain on urination. Dysuria is often present with other symptoms such as:  A sudden urge to go.   Having to go more often.  Dysuria can be caused by:  Urinary tract infections.   Yeast infections.   Prostate problems.   Urinary stones.   Sexually transmitted diseases.  Lab tests of the urine will usually be needed to confirm a urinary infection. An infection is the cause of dysuria in over half the cases. In older men the prostate gland enlarges and can cause urinary problems. These include:   Urinary obstruction.   Infection.    Pain on urination.  Bladder cancer can also cause blood in the urine and dysuria. If you have an infection, be sure to take the antibiotics prescribed for you until they are gone. This will help prevent a recurrence. Further checking by a specialist may be needed if the cause of your dysuria is not found. Cystoscopy, x-rays, pelvic exams, and special cultures may be needed to find the cause and help find the best treatment. See your caregiver right away if your symptoms are not improved after three days.  SEEK IMMEDIATE MEDICAL CARE IF:  You have difficulty urinating, pass bloody urine, or have chills or a fever. Document Released: 01/20/2005 Document Revised: 01/09/2011 Document Reviewed: 07/07/2006 [facility] Patient Information 2012 [place].

## 2011-05-01 NOTE — ED Provider Notes (Signed)
History   Scribed for Orlie Dakin, MD, the patient was seen in room APA19/APA19 . This chart was scribed by Denice Bors.   CSN: [id number]  Arrival date & time 05/01/11  1520   First MD Initiated Contact with Patient 05/01/11 1535      Chief Complaint  Patient presents with  . Dysuria    Burning with urination     HPI Jack Irwin is a 47 y.o. male who presents to the Emergency Department complaining of constant dysuria and suprapubic non-radiating pain since this morning. Pt describes the pain as severe "burning". Pt denies associated urinary retention, penile discharge, and any other urinary symptoms. Pt also reports his heart rate has been speeding up and slowing down since this morning. Pt states he had 2 episodes of his heart rate speeding up this morning lasting up to 2 seconds and 1 episode of his heart rate slowing down lasting up to 1 second. Pt reports associated nausea and dizziness with "heart rate episodes." Suprapubic pain is constant nonradiating. No other complaint no treatment prior to coming here no other associated symptoms  Past Medical History  Diagnosis Date  . Hypertension   . Renal insufficiency   . Diabetes mellitus   . Gout   . Chronic back pain   . BPH (benign prostatic hyperplasia)   . Anemia   . DVT (deep venous thrombosis)   . Chronic kidney disease   . Neuropathy   . Decubitus ulcer     of 2nd toes of both feet.  . Edema leg   . Constipation   . Venous (peripheral) insufficiency   . Chronic kidney disease (CKD), stage IV (severe)   . Neuropathy, diabetic   . Physical deconditioning     Past Surgical History  Procedure Date  . None     Family History  Problem Relation Age of Onset  . Diabetes Mother   . Hypertension Mother   . Heart failure Mother   . Hyperlipidemia Mother   . Cancer Father   . Diabetes Father   . Hypertension Father   . Hyperlipidemia Father     History  Substance Use Topics  . Smoking status:  Never Smoker   . Smokeless tobacco: Never Used  . Alcohol Use: No      Review of Systems  Constitutional: Negative.   HENT: Negative.   Respiratory: Negative.   Cardiovascular: Negative.        Palpitations  Gastrointestinal: Negative.   Genitourinary: Positive for dysuria.  Musculoskeletal: Negative.   Skin: Negative.   Neurological: Negative.   Hematological: Negative.   Psychiatric/Behavioral: Negative.     Allergies  Review of patient's allergies indicates no known allergies.  Home Medications   Current Outpatient Rx  Name Route Sig Dispense Refill  . AMITRIPTYLINE HCL 25 MG PO TABS Oral Take 25 mg by mouth at bedtime.      [name] CALCIUM ACETATE 667 MG PO CAPS Oral Take 667 mg by mouth daily.     . COLCHICINE 0.6 MG PO TABS Oral Take 0.6 mg by mouth daily as needed. For gout    . CORICIDIN HBP FLU PO Oral Take 1 tablet by mouth daily.    . FEBUXOSTAT 80 MG PO TABS Oral Take 1 tablet by mouth daily.    [name] HYDROCODONE-ACETAMINOPHEN 5-325 MG PO TABS Oral Take 1 tablet by mouth every 6 (six) hours as needed. Pain    . LOSARTAN POTASSIUM 25 MG PO TABS Oral Take 25  mg by mouth daily.    [name] METOPROLOL TARTRATE 50 MG PO TABS Oral Take 50 mg by mouth 2 (two) times daily.      . OXYCODONE-ACETAMINOPHEN 5-325 MG PO TABS Oral Take 1 tablet by mouth daily as needed. For pain    . PREDNISONE 10 MG PO TABS Oral Take 10 mg by mouth daily.    [name] VARDENAFIL HCL 20 MG PO TABS Oral Take 20 mg by mouth daily as needed. For sexual intercourse      BP 156/100  Pulse 85  Temp(Src) 97.7 F (36.5 C) (Oral)  Resp 20  Ht 5\' 11"  (1.803 m)  Wt 302 lb (136.986 kg)  BMI 42.12 kg/m2  SpO2 100%  Physical Exam  Nursing note and vitals reviewed. Constitutional: He appears well-developed and well-nourished.  HENT:  Head: Normocephalic and atraumatic.  Eyes: Conjunctivae are normal. Pupils are equal, round, and reactive to light.  Neck: Neck supple. No tracheal deviation present. No thyromegaly  present.  Cardiovascular: Normal rate and regular rhythm.   No murmur heard. Pulmonary/Chest: Effort normal and breath sounds normal.  Abdominal: Soft. Bowel sounds are normal. He exhibits no distension. There is no tenderness.       OBese  Genitourinary:       Circumcised normal genitalia no discharge  Musculoskeletal: Normal range of motion. He exhibits no edema and no tenderness.  Neurological: He is alert. Coordination normal.  Skin: Skin is warm and dry. No rash noted.  Psychiatric: He has a normal mood and affect.    ED Course  Procedures (including critical care time)  Results for orders placed during the hospital encounter of 05/01/11  URINALYSIS, ROUTINE W REFLEX MICROSCOPIC      Component Value Range   Color, Urine YELLOW  YELLOW    APPearance CLEAR  CLEAR    Specific Gravity, Urine 1.025  1.005 - 1.030    pH 5.5  5.0 - 8.0    Glucose, UA NEGATIVE  NEGATIVE (mg/dL)   Hgb urine dipstick TRACE (*) NEGATIVE    Bilirubin Urine NEGATIVE  NEGATIVE    Ketones, ur NEGATIVE  NEGATIVE (mg/dL)   Protein, ur 100 (*) NEGATIVE (mg/dL)   Urobilinogen, UA 0.2  0.0 - 1.0 (mg/dL)   Nitrite NEGATIVE  NEGATIVE    Leukocytes, UA NEGATIVE  NEGATIVE   BASIC METABOLIC PANEL      Component Value Range   Sodium 136  135 - 145 (mEq/L)   Potassium 4.5  3.5 - 5.1 (mEq/L)   Chloride 104  96 - 112 (mEq/L)   CO2 22  19 - 32 (mEq/L)   Glucose, Bld 94  70 - 99 (mg/dL)   BUN 50 (*) 6 - 23 (mg/dL)   Creatinine, Ser 3.08 (*) 0.50 - 1.35 (mg/dL)   Calcium 9.6  8.4 - 10.5 (mg/dL)   GFR calc non Af Amer 23 (*) >90 (mL/min)   GFR calc Af Amer 27 (*) >90 (mL/min)  URINE MICROSCOPIC-ADD ON      Component Value Range   Squamous Epithelial / LPF RARE  RARE    RBC / HPF 0-2  <3 (RBC/hpf)   Bacteria, UA RARE  RARE    Casts HYALINE CASTS (*) NEGATIVE      No results found.   No diagnosis found.  5:10 PM feels improved after treatment with Tylenol  MDM  Note renal function is improved over  February 2013 Plan followup Dr.Avbuere Diagnosis #1dysuria #2 chronic renal insufficiency  I personally performed the services described in this documentation, which was scribed in my presence. The recorded information has been reviewed and considered.    Orlie Dakin, MD 05/01/11 [phone]

## 2011-05-04 IMAGING — CR DG CHEST 1V PORT
1 series · 1 of 1 positions shown · non-contrast
Comparison: [DATE]

CLINICAL DATA: Back pain with inspiration.

PORTABLE CHEST - 1 VIEW

[view not recorded]
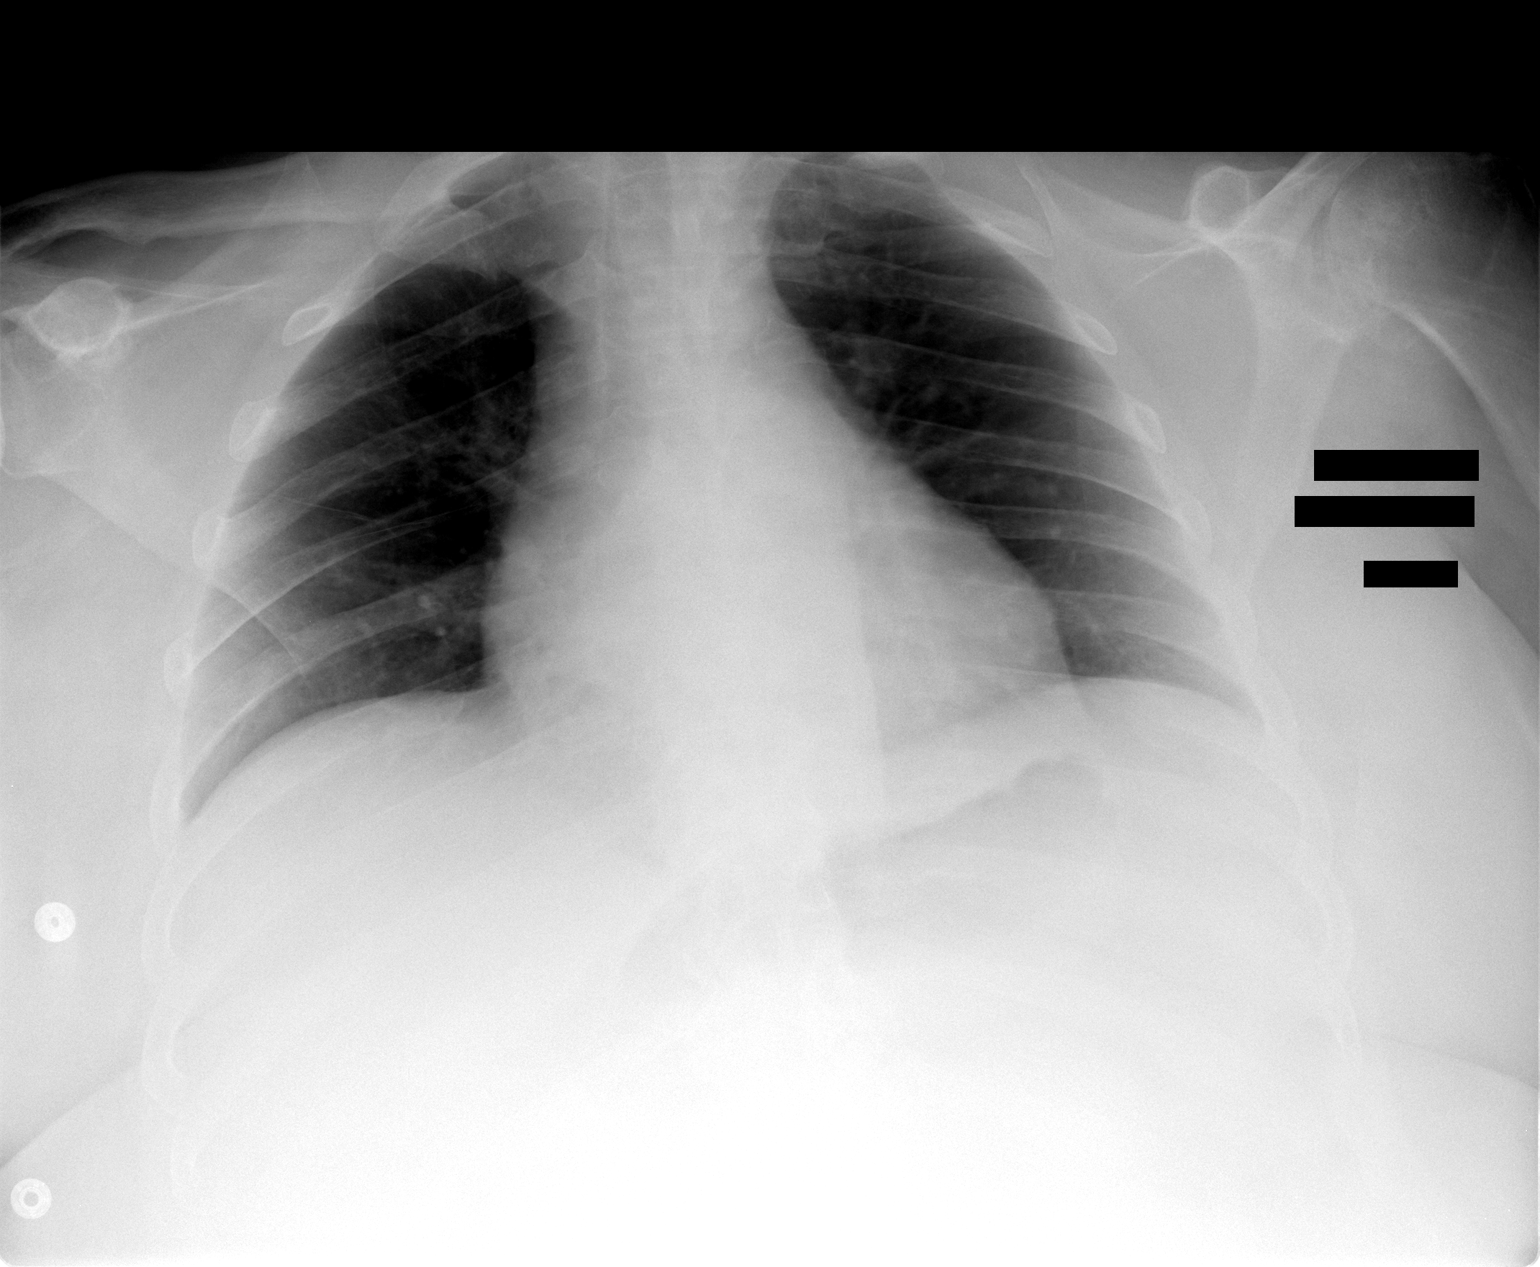

[1 of 1 positions shown; findings below may reference images not displayed]

FINDINGS: Heart size is normal.  Mediastinal shadows are normal.
Lungs are clear.  The vascularity is normal.  No bony abnormalities
seen.
IMPRESSION: No active disease

## 2011-05-06 IMAGING — CR DG CHEST 1V PORT
1 series · 1 of 1 positions shown · non-contrast
Comparison: 10/31/2009

CLINICAL DATA: Shortness of breath, back pain.

PORTABLE CHEST - 1 VIEW

[view not recorded]
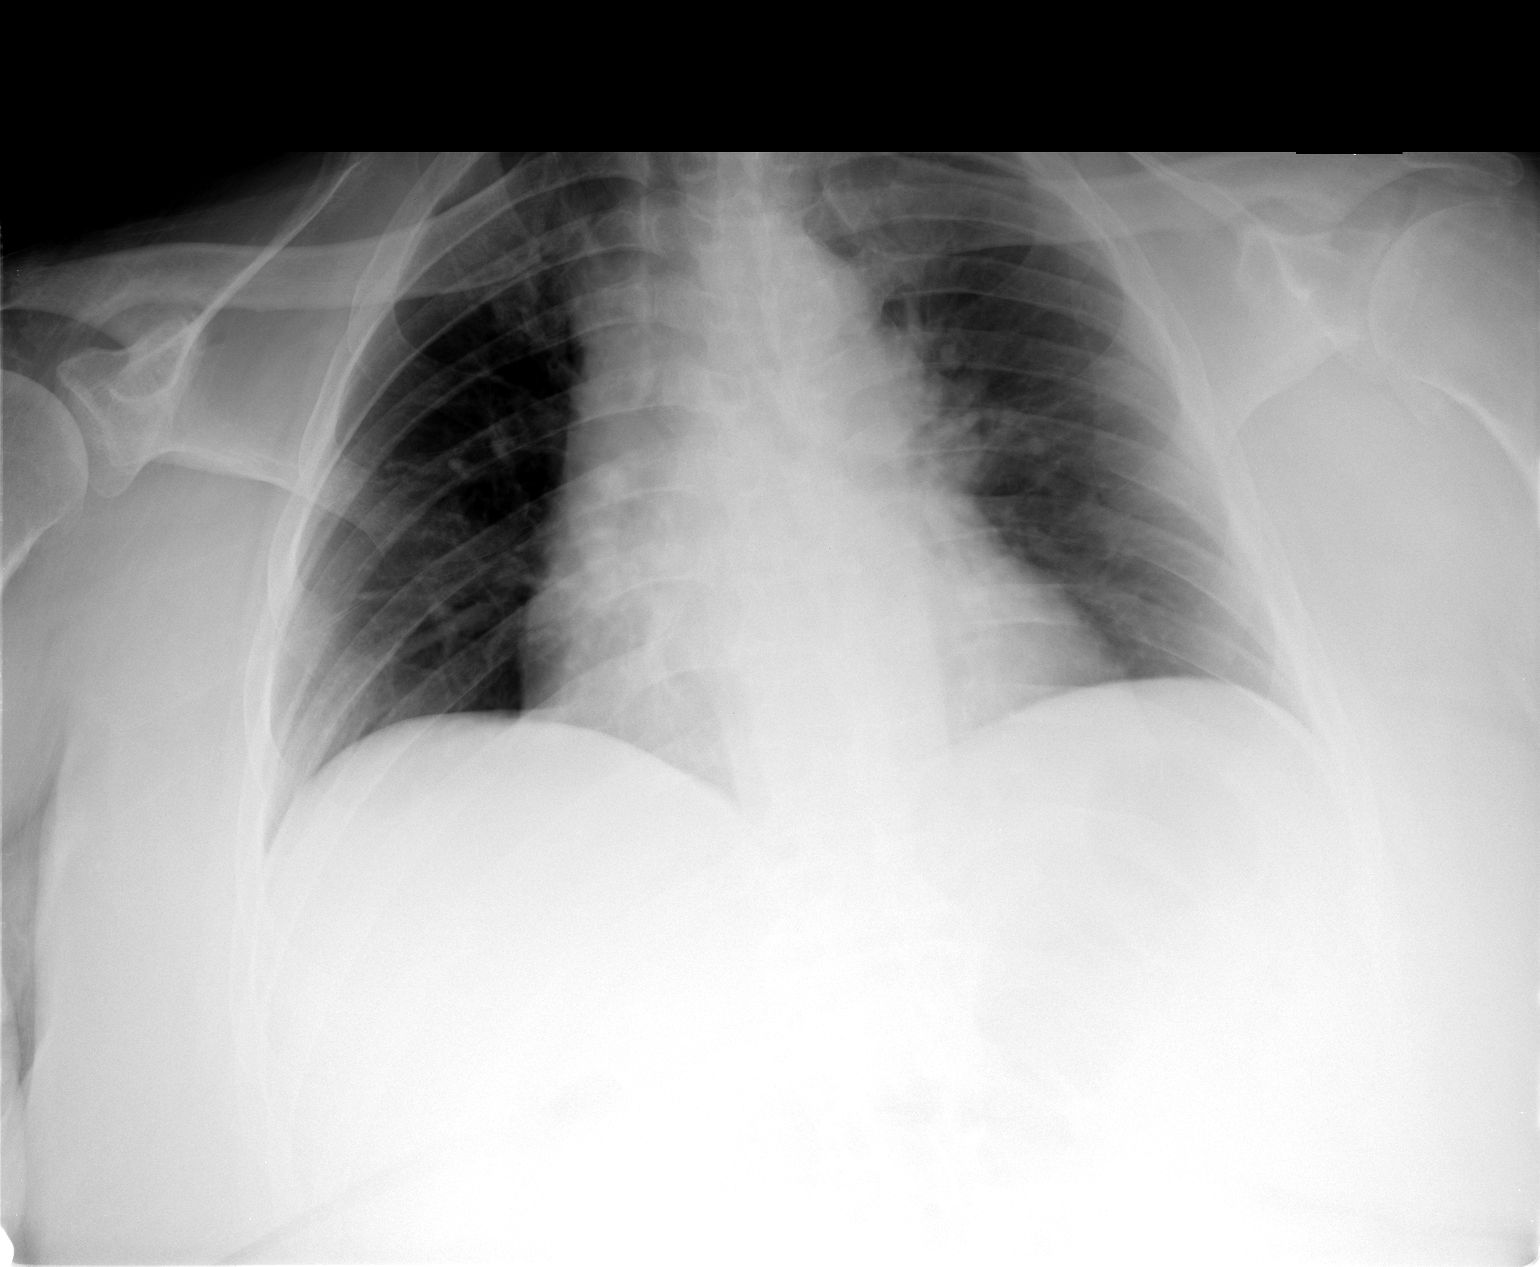

[1 of 1 positions shown; findings below may reference images not displayed]

FINDINGS: There are low lung volumes.  Heart is upper limits normal
in size, accentuated by the portable study and low volumes.  No
focal opacities or effusions.  No acute bony abnormality.
IMPRESSION: No active disease.  Low lung volumes.

## 2011-05-07 IMAGING — CT CT ABD-PELV W/O CM
2 of 3 series · 15 of 36 positions shown, 18 images · non-contrast
Comparison: CT abdomen and pelvis 07/26/2008.  CT chest
01/14/2005.

CT CHEST

CLINICAL DATA: Diffuse body pain.  Question abscess.  Chronic
renal failure.

CT CHEST, ABDOMEN AND PELVIS WITHOUT CONTRAST
TECHNIQUE: Multidetector CT imaging of the chest, abdomen and
pelvis was performed following the standard protocol without IV
contrast.

[Series 2: cap w/o 5.0 b40f · axial · non-contrast · 0.87mm/px · z∈[-604,-60]mm · 12 of 129 slices shown, 15 images]
[im 10/129  mediastinal]
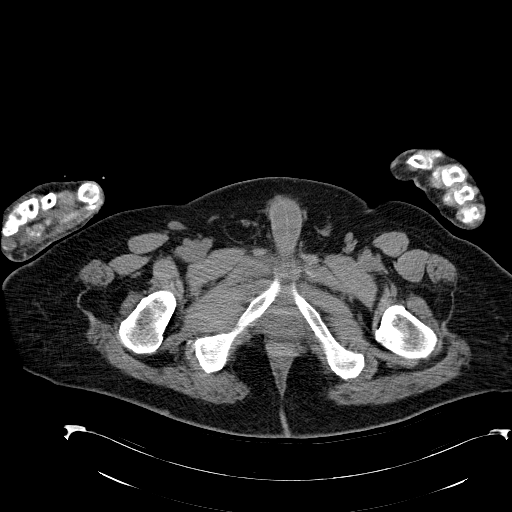
[im 10/129  lung]
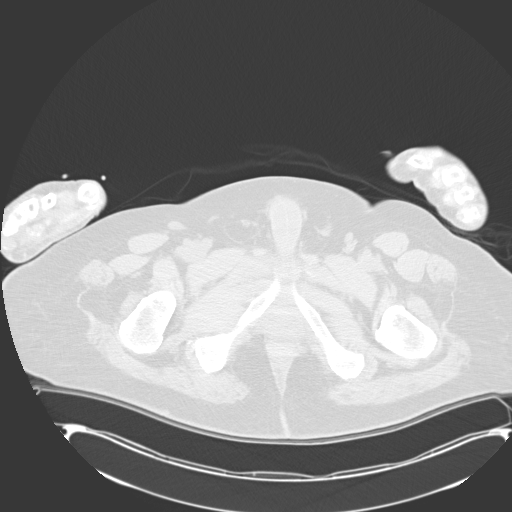
[im 19/129  lung]
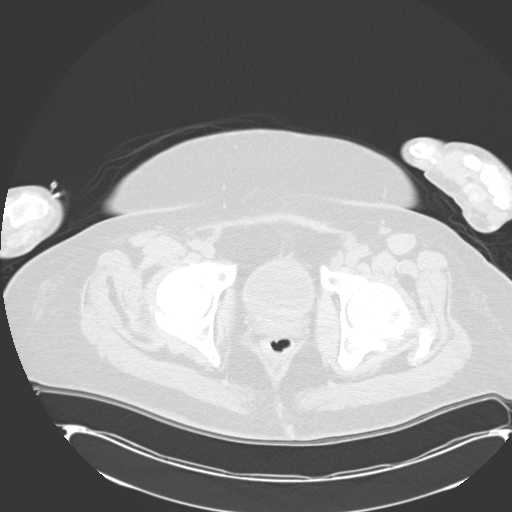
[im 29/129  lung]
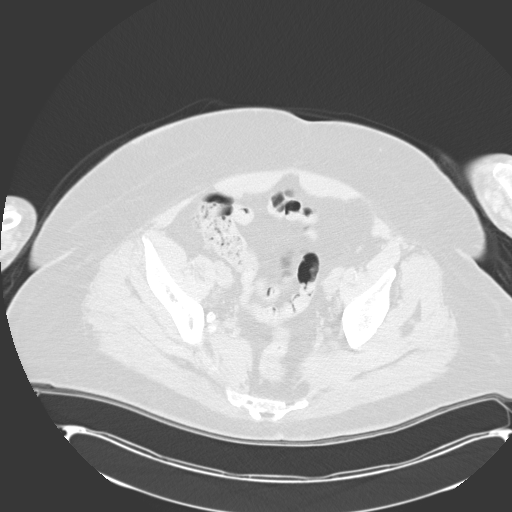
[im 38/129  lung]
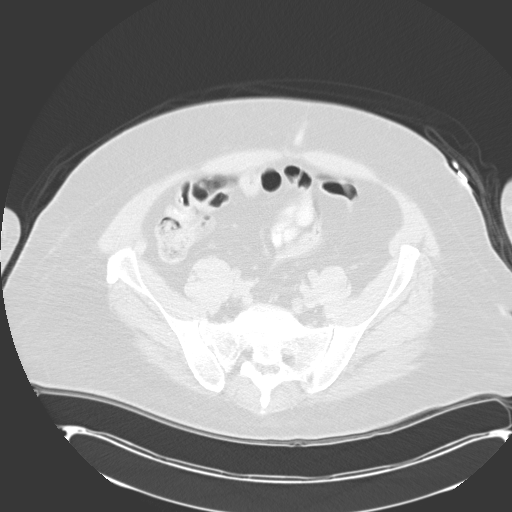
[im 48/129  mediastinal]
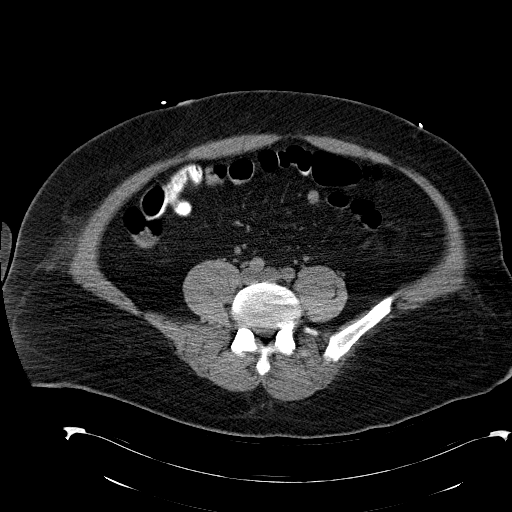
[im 48/129  lung]
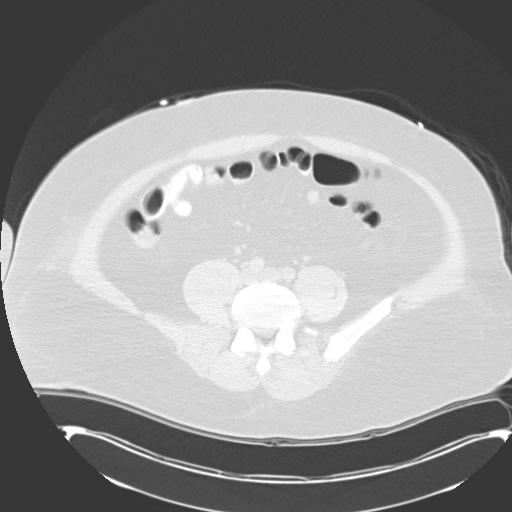
[im 57/129  lung]
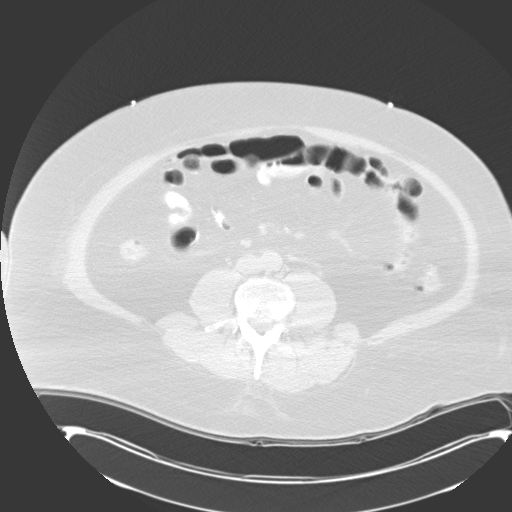
[im 72/129  lung]
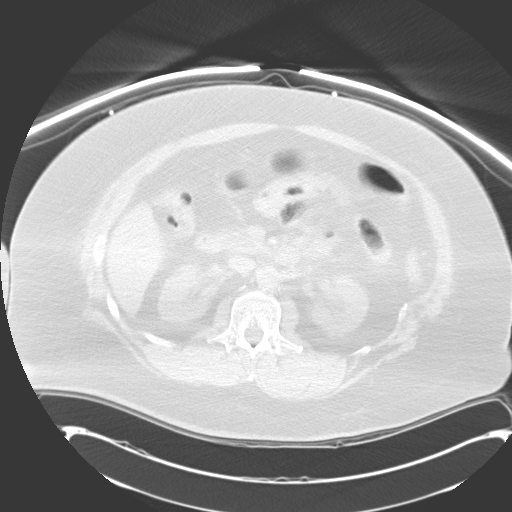
[im 81/129  lung]
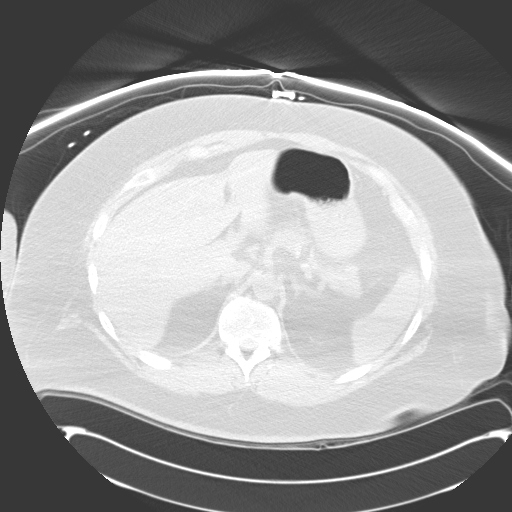
[im 91/129  mediastinal]
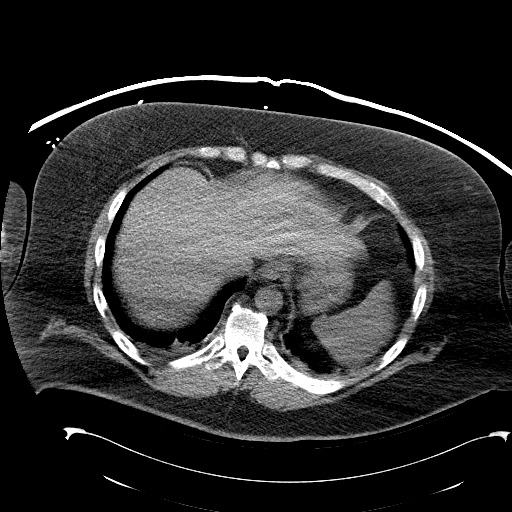
[im 91/129  lung]
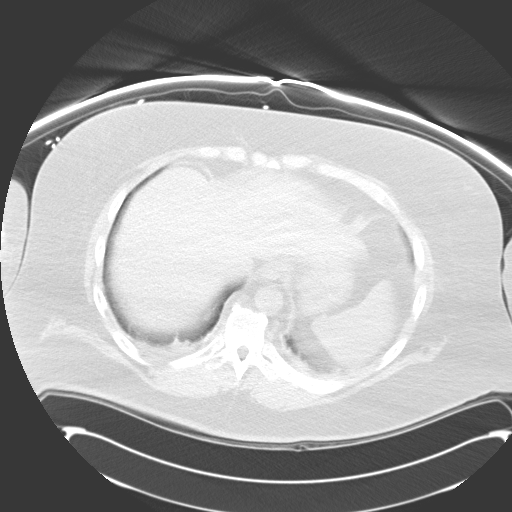
[im 100/129  lung]
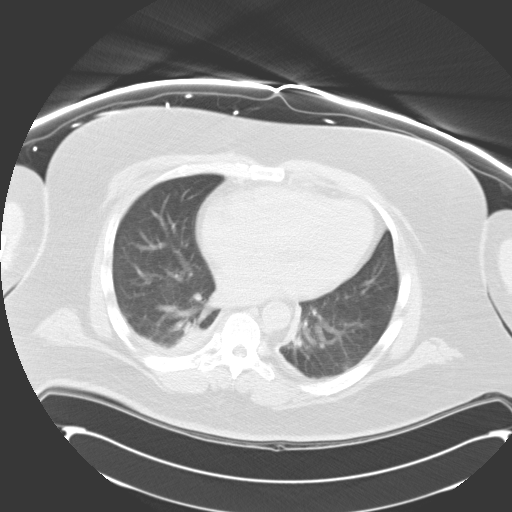
[im 110/129  lung]
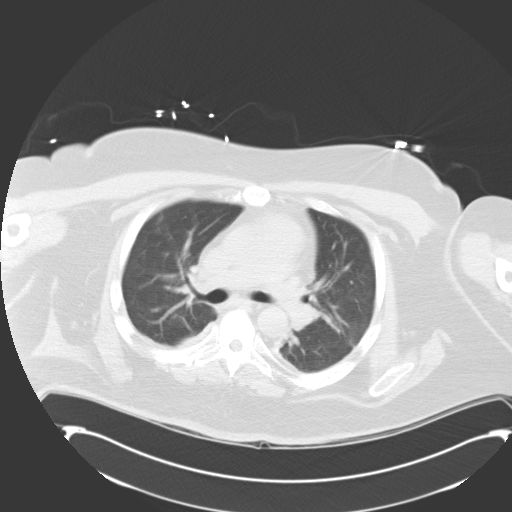
[im 119/129  lung]
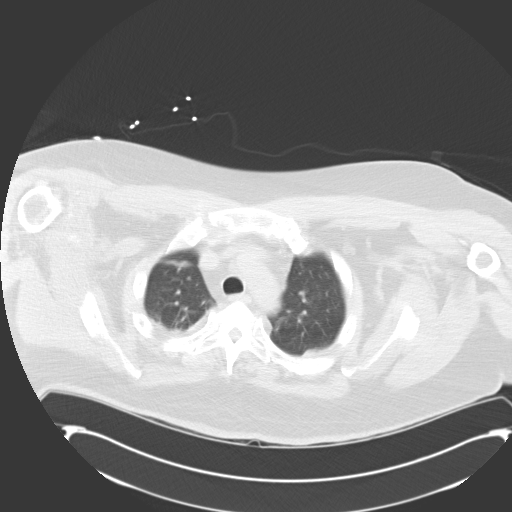

[Series 4: mpr coro cap (id) · coronal · 0.97mm/px · 3 of 98 slices shown]
[im 20/98  lung]
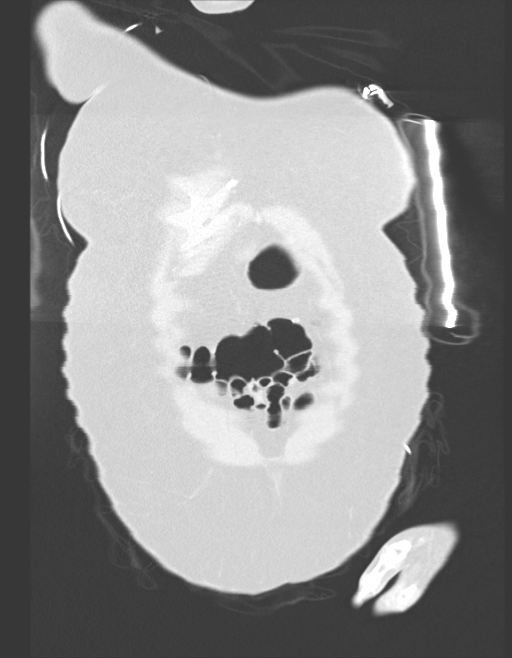
[im 39/98  lung]
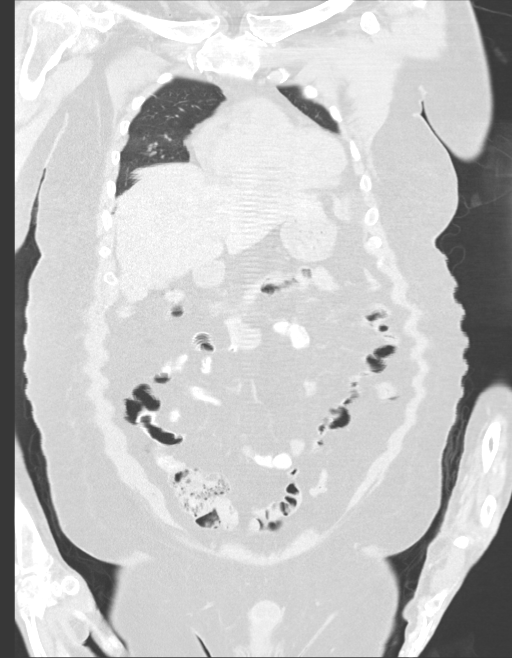
[im 59/98  lung]
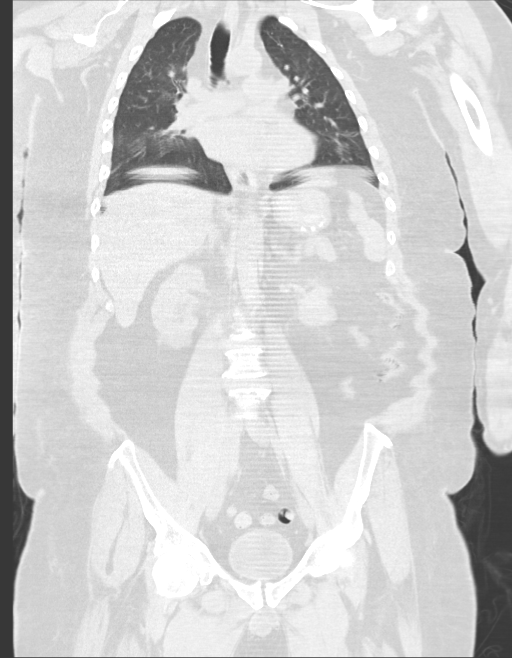

[15 of 36 positions shown; findings below may reference images not displayed]

FINDINGS: There is no pleural or pericardial effusion.  Mild
cardiomegaly is noted.  No axillary, hilar mediastinal
lymphadenopathy.  Lungs demonstrate only some dependent
atelectasis.  Bone shows severe degenerative disease about the left
shoulder no lytic or sclerotic lesion.
IMPRESSION: 1.  No acute finding.
2.  Dependent atelectatic change.
3.  Severe degenerative disease left shoulder.

CT ABDOMEN AND PELVIS
FINDINGS: The gallbladder is decompressed but otherwise
unremarkable.  The liver, adrenal glands, spleen, pancreas and
kidneys appear normal.  No focal fluid collection is identified.
There is no ascites.  No lymphadenopathy.  The stomach, small and
large bowel and appendix are unremarkable.  There has been marked
progression of erosive change about the sacroiliac joints, much
worse on the right. The patient has some degenerative disease of
the lumbar spine.
IMPRESSION: 1.  Negative for abscess.
2.  Asymmetric, right greater than left, the sacroiliitis which has
markedly worsened since the prior study.  Finding is nonspecific
but could be due to rheumatoid arthritis, psoriasis or reactive
arthritis.  Infectious sacroiliitis is possible but felt unlikely
given its slow progression.  Gout can also cause sacroiliitis but
is a somewhat uncommon cause.

## 2011-05-08 IMAGING — CR DG KNEE 1-2V*R*
2 series · 2 of 2 positions shown · non-contrast
Comparison: 06/22/2008

CLINICAL DATA: Acute gout, painful knee

RIGHT KNEE - 1-2 VIEW

[view not recorded (1 of 2)]
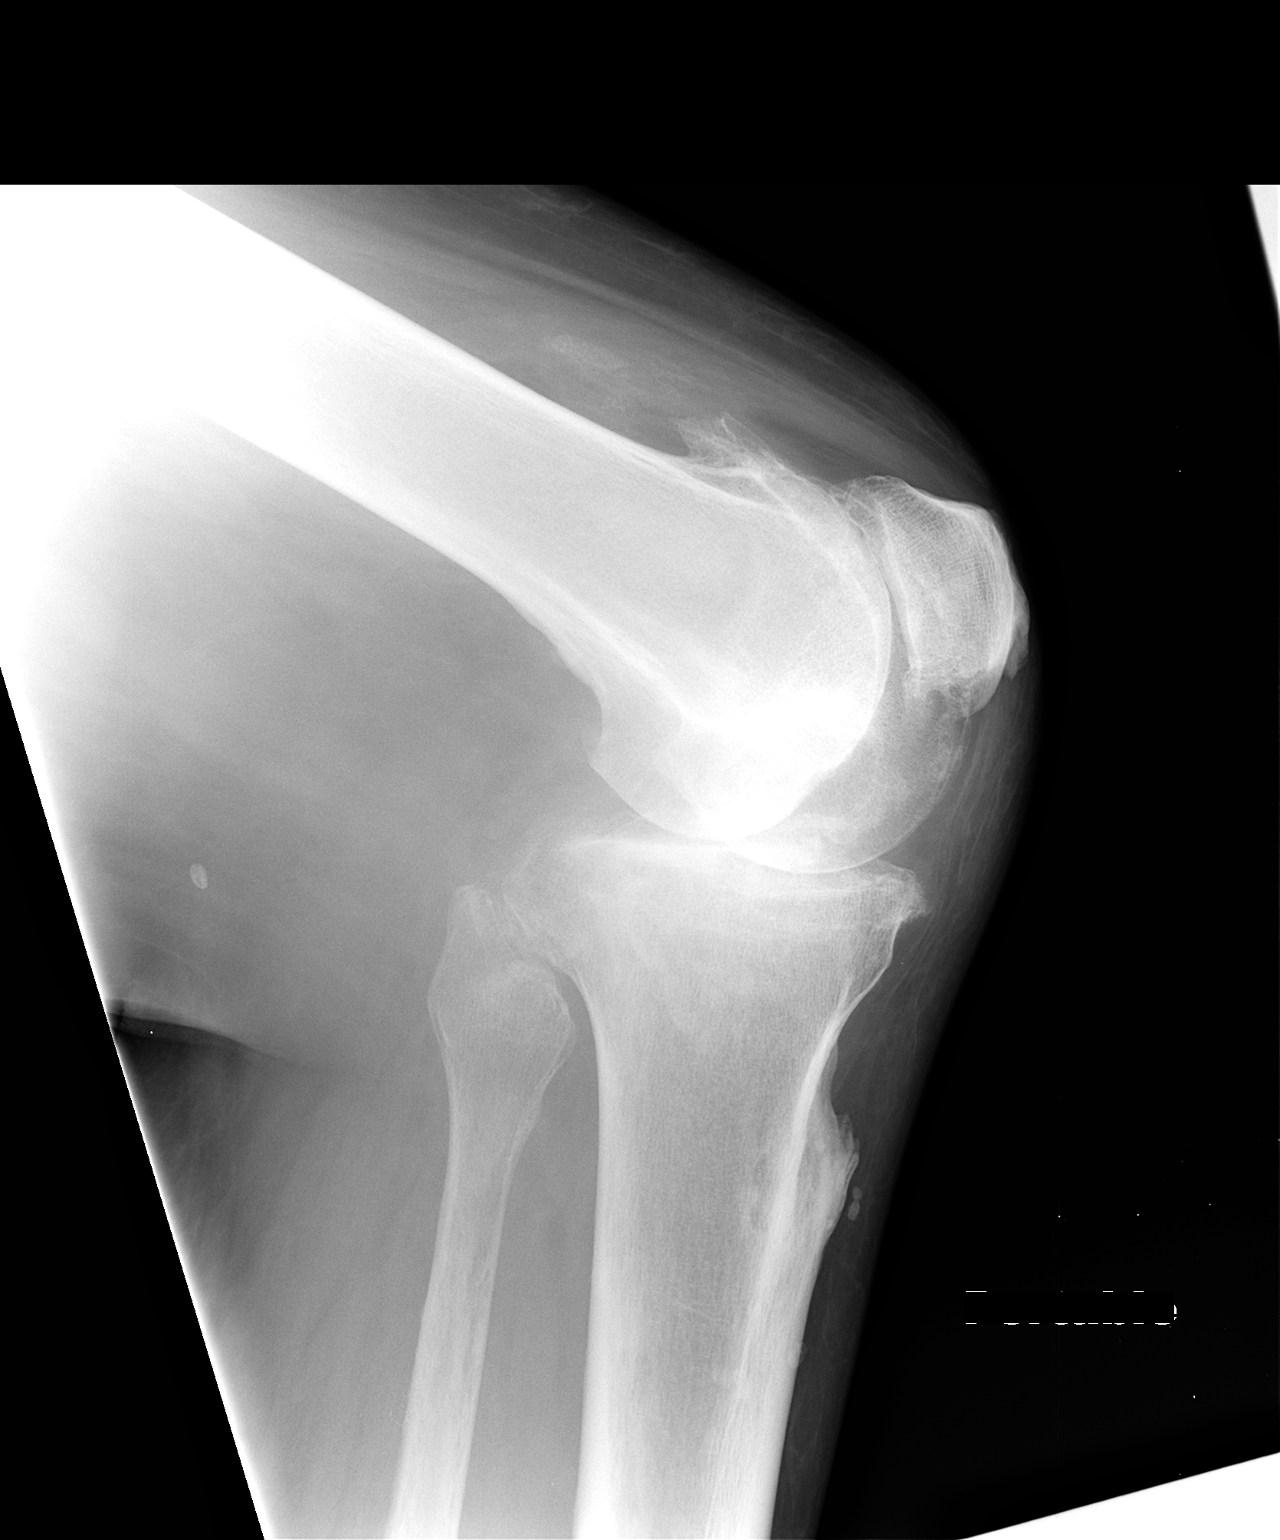

[view not recorded (2 of 2)]
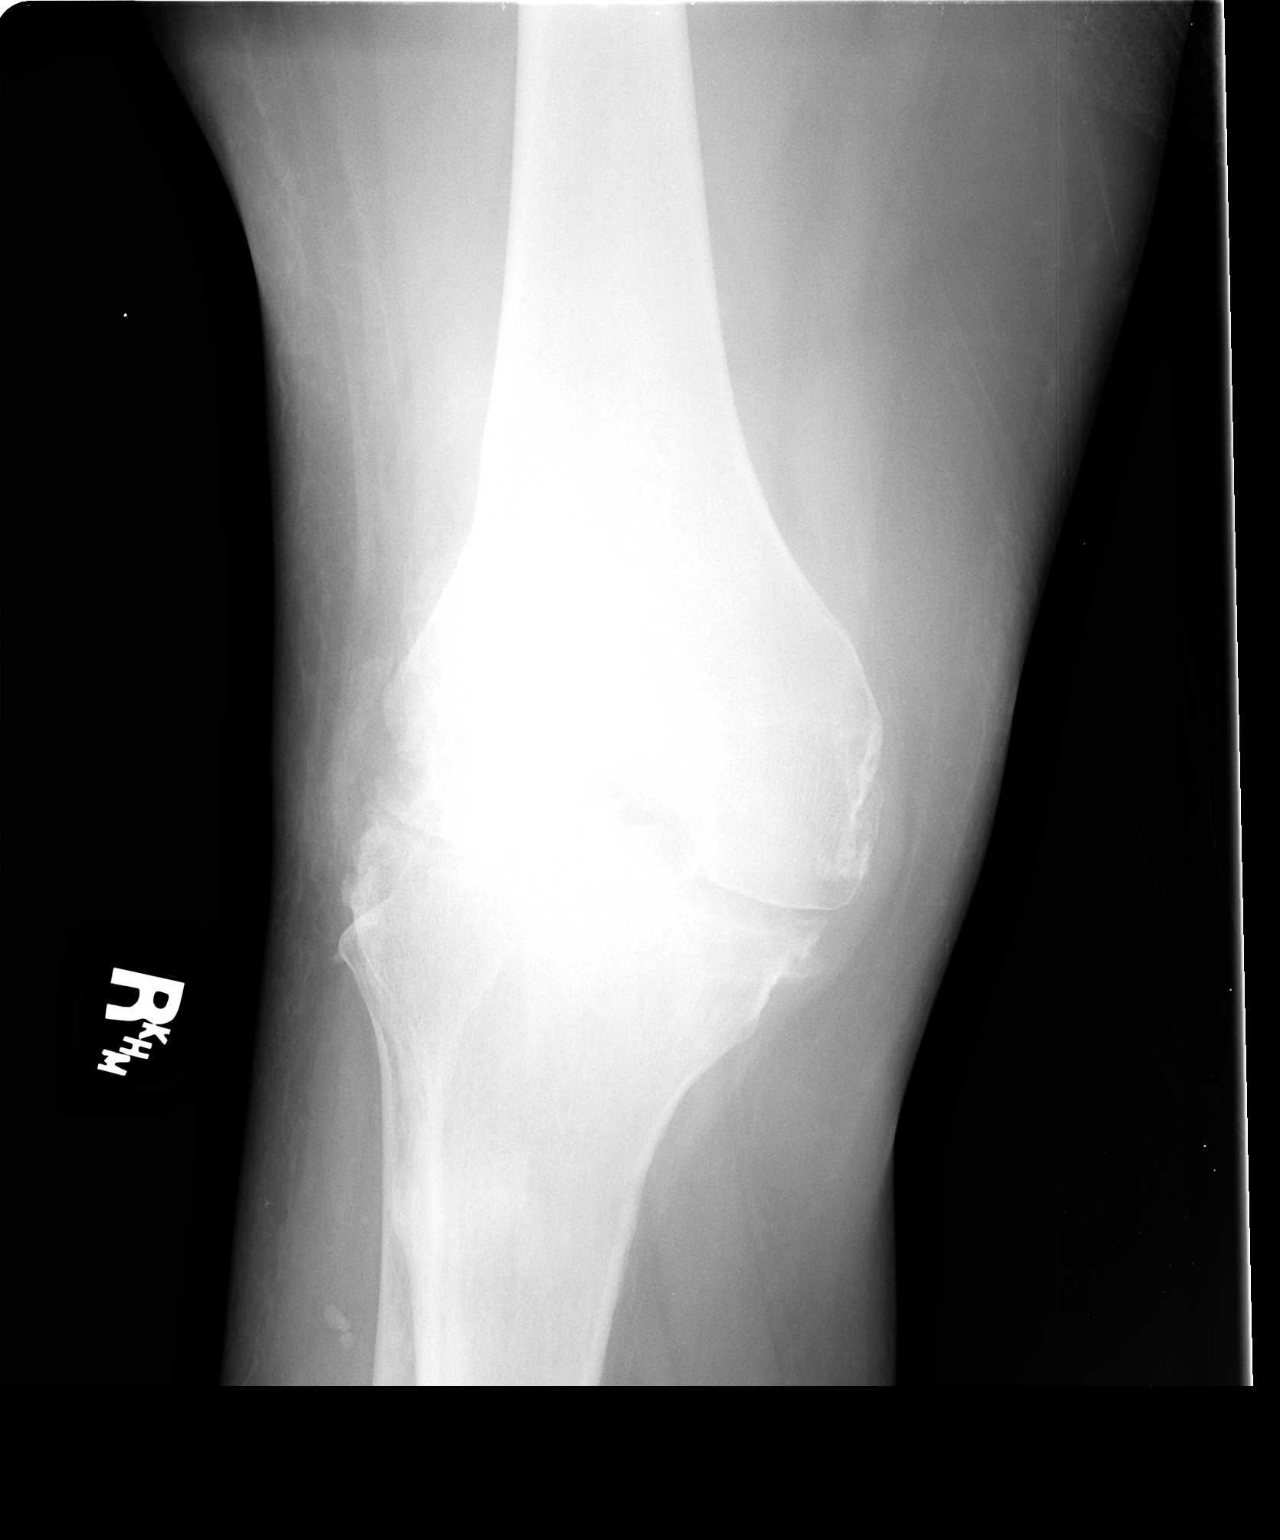

[2 of 2 positions shown; findings below may reference images not displayed]

FINDINGS: Severe degenerative changes are seen in the medial and
there are erosions seen at the lateral aspect of the distal femur.
Overall, the appearance is unchanged compared most recent x-ray.  A
small joint effusion is present.
IMPRESSION: Severe degenerative changes of small joint effusion detailed above.

## 2011-05-14 ENCOUNTER — Emergency Department (HOSPITAL_COMMUNITY)
Admission: EM | Admit: 2011-05-14 | Discharge: 2011-05-14 | Disposition: A | Discharge status: Home / Self Care | Payer: Medicaid Other | Attending: Emergency Medicine | Admitting: Emergency Medicine

## 2011-05-14 ENCOUNTER — Encounter (HOSPITAL_COMMUNITY): Payer: Self-pay | Admitting: Emergency Medicine

## 2011-05-14 ENCOUNTER — Emergency Department (HOSPITAL_COMMUNITY): Payer: Medicaid Other

## 2011-05-14 DIAGNOSIS — I129 Hypertensive chronic kidney disease with stage 1 through stage 4 chronic kidney disease, or unspecified chronic kidney disease: Secondary | ICD-10-CM | POA: Insufficient documentation

## 2011-05-14 DIAGNOSIS — R51 Headache: Secondary | ICD-10-CM | POA: Insufficient documentation

## 2011-05-14 DIAGNOSIS — IMO0001 Reserved for inherently not codable concepts without codable children: Secondary | ICD-10-CM | POA: Insufficient documentation

## 2011-05-14 DIAGNOSIS — R220 Localized swelling, mass and lump, head: Secondary | ICD-10-CM | POA: Insufficient documentation

## 2011-05-14 DIAGNOSIS — N19 Unspecified kidney failure: Secondary | ICD-10-CM

## 2011-05-14 DIAGNOSIS — Z86718 Personal history of other venous thrombosis and embolism: Secondary | ICD-10-CM | POA: Insufficient documentation

## 2011-05-14 DIAGNOSIS — N189 Chronic kidney disease, unspecified: Secondary | ICD-10-CM | POA: Insufficient documentation

## 2011-05-14 DIAGNOSIS — M2540 Effusion, unspecified joint: Secondary | ICD-10-CM | POA: Insufficient documentation

## 2011-05-14 DIAGNOSIS — E119 Type 2 diabetes mellitus without complications: Secondary | ICD-10-CM | POA: Insufficient documentation

## 2011-05-14 DIAGNOSIS — J069 Acute upper respiratory infection, unspecified: Secondary | ICD-10-CM

## 2011-05-14 LAB — BASIC METABOLIC PANEL
GFR calc Af Amer: 26 mL/min — ABNORMAL LOW (ref >90)
GFR calc non Af Amer: 23 mL/min — ABNORMAL LOW (ref >90)
Potassium: 4.6 mEq/L (ref 3.5–5.1)
Sodium: 136 mEq/L (ref 135–145)

## 2011-05-14 LAB — CBC
Hemoglobin: 10.9 g/dL — ABNORMAL LOW (ref 13.0–17.0)
RBC: 4.11 MIL/uL — ABNORMAL LOW (ref 4.22–5.81)

## 2011-05-14 LAB — DIFFERENTIAL
Basophils Relative: 0 % (ref 0–1)
Lymphocytes Relative: 15 % (ref 12–46)
Lymphs Abs: 1.1 10*3/uL (ref 0.7–4.0)
Monocytes Relative: 17 % — ABNORMAL HIGH (ref 3–12)
Neutro Abs: 4.4 10*3/uL (ref 1.7–7.7)
Neutrophils Relative %: 63 % (ref 43–77)

## 2011-05-14 MED ORDER — HYDROCOD POLST-CHLORPHEN POLST 10-8 MG/5ML PO LQCR
5.0000 mL | Freq: Once | ORAL | Status: AC
  Administered 2011-05-14: 5 mL via ORAL
  Filled 2011-05-14: qty 5

## 2011-05-14 MED ORDER — PROMETHAZINE-CODEINE 6.25-10 MG/5ML PO SYRP: 5.0000 mL | ORAL_SOLUTION | Freq: Four times a day (QID) | ORAL | Status: AC | PRN

## 2011-05-14 NOTE — ED Provider Notes (Signed)
History     CSN: [id number]  Arrival date & time 05/14/11  [id number]   First MD Initiated Contact with Patient 05/14/11 [phone]      Chief Complaint  Patient presents with  . Cough  . Headache    (Consider location/radiation/quality/duration/timing/severity/associated sxs/prior treatment) HPI Comments: Pt denies fever or hemoptosis. He c/o pain in the chest, abd, and groin with cough. He at times has to sit up in a chair for support of chronic back pain and improvement of his breathing.  Patient is a 46 y.o. male presenting with cough and headaches. The history is provided by the patient.  Cough This is a recurrent problem. The current episode started more than 2 days ago. The problem occurs every few minutes. The problem has been gradually worsening. The cough is productive of sputum. There has been no fever. Associated symptoms include sweats, headaches, myalgias and shortness of breath. Pertinent negatives include no chest pain, no chills, no sore throat and no wheezing. Associated symptoms comments: Has to sit up in a chair at night to assist with breathing.[name] He is not a smoker. His past medical history is significant for bronchitis. Past medical history comments: HBP, Renal failure.  Headache  Associated symptoms include shortness of breath. Pertinent negatives include no palpitations.    Past Medical History  Diagnosis Date  . Hypertension   . Renal insufficiency   . Diabetes mellitus   . Gout   . Chronic back pain   . BPH (benign prostatic hyperplasia)   . Anemia   . DVT (deep venous thrombosis)   . Chronic kidney disease   . Neuropathy   . Decubitus ulcer     of 2nd toes of both feet.  . Edema leg   . Constipation   . Venous (peripheral) insufficiency   . Chronic kidney disease (CKD), stage IV (severe)   . Neuropathy, diabetic   . Physical deconditioning     Past Surgical History  Procedure Date  . None     Family History  Problem Relation Age of Onset  .  Diabetes Mother   . Hypertension Mother   . Heart failure Mother   . Hyperlipidemia Mother   . Cancer Father   . Diabetes Father   . Hypertension Father   . Hyperlipidemia Father     History  Substance Use Topics  . Smoking status: Never Smoker   . Smokeless tobacco: Never Used  . Alcohol Use: No      Review of Systems  Constitutional: Negative for chills and activity change.       All ROS Neg except as noted in HPI  HENT: Positive for facial swelling. Negative for nosebleeds, sore throat and neck pain.   Eyes: Negative for photophobia and discharge.  Respiratory: Positive for cough and shortness of breath. Negative for wheezing.   Cardiovascular: Negative for chest pain and palpitations.  Gastrointestinal: Negative for abdominal pain and blood in stool.  Genitourinary: Negative for dysuria, frequency and hematuria.  Musculoskeletal: Positive for myalgias and joint swelling. Negative for back pain and arthralgias.  Skin: Negative.   Neurological: Positive for headaches. Negative for dizziness, seizures and speech difficulty.  Psychiatric/Behavioral: Negative for hallucinations and confusion.    Allergies  Review of patient's allergies indicates no known allergies.  Home Medications   Current Outpatient Rx  Name Route Sig Dispense Refill  . AMLODIPINE BESYLATE 5 MG PO TABS Oral Take 5 mg by mouth daily as needed. Takes when blood pressure drops.    [name]  CALCIUM ACETATE 667 MG PO CAPS Oral Take 667 mg by mouth daily.     [name] VITAMIN D 1000 UNITS PO TABS Oral Take 1,000 Units by mouth daily.    . CORICIDIN HBP DAY/NIGHT COLD PO Oral Take 2 tablets by mouth daily as needed. Cold Symptoms    . FEBUXOSTAT 80 MG PO TABS Oral Take 1 tablet by mouth daily.    . FUROSEMIDE 40 MG PO TABS Oral Take 40 mg by mouth daily.    [name] HYDROCODONE-ACETAMINOPHEN 5-325 MG PO TABS Oral Take 1 tablet by mouth every 6 (six) hours as needed.    [name] METOPROLOL TARTRATE 50 MG PO TABS Oral Take 50 mg by  mouth 2 (two) times daily.      [name] PREDNISONE 5 MG PO TABS Oral Take 5 mg by mouth daily.    [name] VARDENAFIL HCL 20 MG PO TABS Oral Take 20 mg by mouth daily as needed. For sexual intercourse      BP 139/95  Pulse 87  Temp(Src) 98.5 F (36.9 C) (Oral)  Resp 21  Ht 5\' 11"  (1.803 m)  Wt 300 lb (136.079 kg)  BMI 41.84 kg/m2  SpO2 99%  Physical Exam  Nursing note and vitals reviewed. Constitutional: He is oriented to person, place, and time. He appears well-developed and well-nourished.  Non-toxic appearance.  HENT:  Head: Normocephalic.  Right Ear: Tympanic membrane and external ear normal.  Left Ear: Tympanic membrane and external ear normal.       Mod facial swelling.  Eyes: EOM and lids are normal. Pupils are equal, round, and reactive to light.  Neck: Normal range of motion. Neck supple. Carotid bruit is not present.  Cardiovascular: Normal rate, regular rhythm, normal heart sounds, intact distal pulses and normal pulses.   Pulmonary/Chest: Breath sounds normal. No respiratory distress.       Mild chest wall soreness.  Abdominal: Soft. Bowel sounds are normal. There is no tenderness. There is no guarding.       Mod swelling present.  Musculoskeletal:       Degenerative changes of multiple joints. 1+ pitting edema of the lower extremities.  Lymphadenopathy:       Head (right side): No submandibular adenopathy present.       Head (left side): No submandibular adenopathy present.    He has no cervical adenopathy.  Neurological: He is alert and oriented to person, place, and time. He has normal strength. No cranial nerve deficit or sensory deficit.  Skin: Skin is warm and dry.  Psychiatric: He has a normal mood and affect. His speech is normal.    ED Course  Procedures (including critical care time)   Labs Reviewed  PRO B NATRIURETIC PEPTIDE  BASIC METABOLIC PANEL  CBC  DIFFERENTIAL  TROPONIN I   No results found.   No diagnosis found.    MDM  I have reviewed  nursing notes, vital signs, and all appropriate lab and imaging results for this patient. Chest xray is negative. Labs continue to point to renal failure. Will treat with cough medication. I suspect that pt is in need of dialysis sooner than later. Suspect increased pulmonary vascular resistance with resultant diastolic heart failure (BNP >300) with result cough, aggrivated by URI. Pt encouraged to see his specialist about dialysis and Rx for promethazine cough med q6h ordered.       Lenox Ahr, [place] 05/14/11 1121

## 2011-05-14 NOTE — Discharge Instructions (Signed)
Your renal/kidney test remain abnormal. Your xray is negative for pneumonia, fluid, or acute problems. Please see your MD for consideration of possible dialysis, and use promethazine cough med for the cough. This medication may cause drowsiness, use with caution.Getting Ready For Dialysis When kidneys do not work well it is called kidney failure. This is sometimes called Chronic Kidney Disease (CKD). Kidney failure may happen gradually or suddenly. Kidneys stop working well for many different reasons. The most common causes are:  Diabetes.   High blood pressure.   Kidney infection.   Cysts in the kidneys.  TREATMENT OF KIDNEY FAILURE  Kidney transplant.   Hemodialysis.   Peritoneal dialysis.  KIDNEY TRANSPLANT: A kidney from another person is put into your body. This kidney takes the place of your kidney.  Kidneys can be transplanted from:   A family member.   A friend.   A "brain-dead" person.   There are not enough kidneys for all the people who need them. Only a few people with kidney failure will get transplants.   To find out about kidney transplant, ask your doctor. Your doctor will tell you how to apply to get a kidney.  DIALYSIS: There are 2 types of dialysis:  Hemodialysis.   Peritoneal dialysis.  You and your doctor will talk about which is best for you. Before you can start dialysis, you need surgery to make the "access" (a place on your body through which dialysis can be done). The access is meant to be permanent. About Hemodialysis: There are 2 ways to access your blood.  The best type of access is an arteriovenous fistula. This makes a good connection place for dialysis. Let this new access heal for 4-6 weeks.   If veins are small or have scars and cannot be used to make a fistula, then an arteriovenous graft can be made. It should be allowed to heal for 2-4 weeks.  Access allows your blood to flow through special tubes to a machine. There your blood is  cleaned by a filter. You do not feel the blood moving. Only 1 cup of blood is out of your body at one time.  PERITONEAL DIALYSIS ACCESS: How peritoneal dialysis works: The peritoneal access is made by putting a tube into your belly. Peritoneal dialysis works inside Veterinary surgeon. It gets rid of waste, poisons and excess water. Your body's own membranes in the belly are used. This body part is like a thin, soft sheet which covers the organs inside your belly. This part works as the filter. During access surgery, the catheter is put directly through the wall of your abdomen.  BEFORE YOU HAVE ANY TYPE OF ACCESS SURGERY Things you can do to help yourself before surgery:  Be as healthy as you can before surgery. Stay healthy after surgery. Talk to your doctor about ways to:   Lose weight if you are too heavy.   Stop smoking.   Exercise and keep active.   Lessen stress.   Take your high blood pressure or diabetes medicine. Keep your blood pressure or blood sugar at a healthy level.   Find out all you can about kidney disease and the treatment and surgery for it. Understand what your lab tests mean. Ask questions until you understand.   If you will be getting a fistula or graft in your arm, exercise your arm muscles by squeezing and letting go of a rubber ball in your hand. Do this 4 times a day (breakfast, lunch, dinner, bedtime).  Each time, squeeze the ball 125 times.   Most often the surgery is done in the Day Surgery Unit at the hospital.   For fistula or graft surgery, you will be given a shot in your under-arm so that you will have no feeling in the arm for awhile. You will also be given medicine to help you relax and maybe even sleep.   For all access surgeries, do not eat or drink for at least 8 hours before your surgery.   For peritoneal access surgery, it will be important to clean out your bowels very well. Ask your doctor which laxative to use.  TAKING CARE OF YOUR HEMODIALYSIS ACCESS  AFTER SURGERY:  For 2 days keep your arm raised on pillows. Keep your arm above your heart when resting or sleeping. Do not put a heating pad on your arm.   You may use an ice pack for the first 6 - 12 hours after surgery. A bag of frozen peas wrapped in a thin cloth makes a good cold pack. Put it outside the area of the gauze bandage. Put it on the sides and back of your arm. Never put the ice pack directly on top of your fistula or graft.   Every day, look closely at the surgery area. Watch for signs of infection. Some mild swelling and discomfort is normal for about 2 weeks.   You may also have some bruises around your fistula or graft. This is normal. A tiny bit of bleeding is also normal.   At least twice a day you should check your fistula or graft to see if it is "working". Do this with three fingertips laid lightly above the surgery area. If you have a fistula, you should feel a "buzz" or "thrill". This means blood is flowing well. If you have a graft, you should feel a strong pulse. If you do not feel anything, call your kidney doctor right away.   If you hurt, take the pain medicine your doctor prescribed. It is best to take pain medicine for only 1 or 2 days after surgery.   Usually, there are no stitches that need to be taken out. The tape strips may stay on for 7 - 10 days and fall off by themselves. A loose bandage may be put over your arm. This will cover the place where the surgery was done. Do not wrap or tape tight bandages all the way around your arm.   To keep the blood in your fistula or graft from clotting:   Do not sleep on your arm.   Do not keep your elbow bent for long periods of time. Never use a sling.   Do not wear tight sleeves or anything binding on your arm.   Do not let anyone except dialysis doctors and nurses stick needles into your fistula or graft.   Do not let the arm with the fistula or graft be used to check your blood pressure.   Do not carry heavy  things with the fistula or graft arm.   Ask your doctor when you can get your arm wet. Dry your fistula or graft by patting it gently with a clean towel. Do not rub it dry.   After 5 days, begin mild exercise of your arm. Squeeze and let go of a rubber ball (or a rolled-up sock) in your hand. Do this 4 times a day. Each time, squeeze 125 times.   Every time you go to the center for  dialysis wash your access arm well. Wash it for 2 minutes with soap and water before your treatment. Pat it dry gently.   Remind your dialysis nurse to stick your access in a different place each time.   If you have any questions or problems, call the Vascular Access Nurse. Be sure you have check-up appointments at 1 - 2 weeks and 6 weeks after surgery.  CALL YOUR DOCTOR RIGHT AWAY IF:  You have no feeling, tingling or pain in your hand.   You are not able to move your hand.   Your arm is red, swollen or has pain.   Your surgery area keeps bleeding.   You have drainage of any kind from your wound.   You have a fever over 100 F (37.7 C).  POSSIBLE PROBLEMS WITH YOUR HEMODIALYSIS ACCESS Sometimes an access may have a problem such as:  Clotting.   Weak, balloon-like places on your fistula.   Not working well.  If a second surgery is needed to remove blood clots or to fix other problems, your access can still be used for dialysis soon afterward. If not your doctor will let you know.  If you have already had a second, corrective surgery, re-read Taking Care of Your Hemodialysis Access After Surgery above. TAKING CARE OF YOUR PERITONEAL DIALYSIS ACCESS AFTER SURGERY:  After surgery you will have some pain. This is normal. Take the pain medicine that your doctor prescribed for you.   It is best to take pain medicine for only 1 or 2 days after surgery. If you need to take it longer than this, ask your doctor about a mild laxative. Pain medicine can cause constipation.   For the next 2 weeks, do not lift  anything that weighs more than 5 pounds. A half-gallon of milk weighs about 4 pounds. Do not strain or tighten the muscles of your belly.   There are no stitches that need to be taken out. Leave the bandage on until you go to the dialysis unit for training.   If you have any questions or problems, call the Vascular Access Nurse. Be sure you have check up appointments at 1 - 2 weeks and 6 weeks after surgery.   Make sure you also have an appointment at your dialysis center to be trained to do your peritoneal dialysis. Call your dialysis center to start your training.  GET HELP RIGHT AWAY IF:   The place around your tube is red, swollen or has pain.   There are bumps.   Your skin feels hot.   Your surgery area keeps on bleeding.   You have drainage of any kind from your wound.   You have a fever over 100 F (37.7 C).  Document Released: 01/08/2009 Document Revised: 01/09/2011 Document Reviewed: 01/08/2009 [facility] Patient Information 2012 [place].Upper Respiratory Infection, Adult An upper respiratory infection (URI) is also sometimes known as the common cold. The upper respiratory tract includes the nose, sinuses, throat, trachea, and bronchi. Bronchi are the airways leading to the lungs. Most people improve within 1 week, but symptoms can last up to 2 weeks. A residual cough may last even longer.  CAUSES Many different viruses can infect the tissues lining the upper respiratory tract. The tissues become irritated and inflamed and often become very moist. Mucus production is also common. A cold is contagious. You can easily spread the virus to others by oral contact. This includes kissing, sharing a glass, coughing, or sneezing. Touching your mouth or  nose and then touching a surface, which is then touched by another person, can also spread the virus. SYMPTOMS  Symptoms typically develop 1 to 3 days after you come in contact with a cold virus. Symptoms vary from person to person.  They may include:  Runny nose.   Sneezing.   Nasal congestion.   Sinus irritation.   Sore throat.   Loss of voice (laryngitis).   Cough.   Fatigue.   Muscle aches.   Loss of appetite.   Headache.   Low-grade fever.  DIAGNOSIS  You might diagnose your own cold based on familiar symptoms, since most people get a cold 2 to 3 times a year. Your caregiver can confirm this based on your exam. Most importantly, your caregiver can check that your symptoms are not due to another disease such as strep throat, sinusitis, pneumonia, asthma, or epiglottitis. Blood tests, throat tests, and X-rays are not necessary to diagnose a common cold, but they may sometimes be helpful in excluding other more serious diseases. Your caregiver will decide if any further tests are required. RISKS AND COMPLICATIONS  You may be at risk for a more severe case of the common cold if you smoke cigarettes, have chronic heart disease (such as heart failure) or lung disease (such as asthma), or if you have a weakened immune system. The very young and very old are also at risk for more serious infections. Bacterial sinusitis, middle ear infections, and bacterial pneumonia can complicate the common cold. The common cold can worsen asthma and chronic obstructive pulmonary disease (COPD). Sometimes, these complications can require emergency medical care and may be life-threatening. PREVENTION  The best way to protect against getting a cold is to practice good hygiene. Avoid oral or hand contact with people with cold symptoms. Wash your hands often if contact occurs. There is no clear evidence that vitamin C, vitamin E, echinacea, or exercise reduces the chance of developing a cold. However, it is always recommended to get plenty of rest and practice good nutrition. TREATMENT  Treatment is directed at relieving symptoms. There is no cure. Antibiotics are not effective, because the infection is caused by a virus, not by  bacteria. Treatment may include:  Increased fluid intake. Sports drinks offer valuable electrolytes, sugars, and fluids.   Breathing heated mist or steam (vaporizer or shower).   Eating chicken soup or other clear broths, and maintaining good nutrition.   Getting plenty of rest.   Using gargles or lozenges for comfort.   Controlling fevers with ibuprofen or acetaminophen as directed by your caregiver.   Increasing usage of your inhaler if you have asthma.  Zinc gel and zinc lozenges, taken in the first 24 hours of the common cold, can shorten the duration and lessen the severity of symptoms. Pain medicines may help with fever, muscle aches, and throat pain. A variety of non-prescription medicines are available to treat congestion and runny nose. Your caregiver can make recommendations and may suggest nasal or lung inhalers for other symptoms.  HOME CARE INSTRUCTIONS   Only take over-the-counter or prescription medicines for pain, discomfort, or fever as directed by your caregiver.   Use a warm mist humidifier or inhale steam from a shower to increase air moisture. This may keep secretions moist and make it easier to breathe.   Drink enough water and fluids to keep your urine clear or pale yellow.   Rest as needed.   Return to work when your temperature has returned to normal or  as your caregiver advises. You may need to stay home longer to avoid infecting others. You can also use a face mask and careful hand washing to prevent spread of the virus.  SEEK MEDICAL CARE IF:   After the first few days, you feel you are getting worse rather than better.   You need your caregiver's advice about medicines to control symptoms.   You develop chills, worsening shortness of breath, or brown or red sputum. These may be signs of pneumonia.   You develop yellow or brown nasal discharge or pain in the face, especially when you bend forward. These may be signs of sinusitis.   You develop a fever,  swollen neck glands, pain with swallowing, or white areas in the back of your throat. These may be signs of strep throat.  SEEK IMMEDIATE MEDICAL CARE IF:   You have a fever.   You develop severe or persistent headache, ear pain, sinus pain, or chest pain.   You develop wheezing, a prolonged cough, cough up blood, or have a change in your usual mucus (if you have chronic lung disease).   You develop sore muscles or a stiff neck.  Document Released: 07/16/2000 Document Revised: 01/09/2011 Document Reviewed: 05/24/2010 [facility] Patient Information 2012 [place].

## 2011-05-14 NOTE — ED Notes (Signed)
Pt c/o cough, chest pain when coughing, headache, chills, and fever since Monday.

## 2011-05-15 NOTE — ED Provider Notes (Signed)
Medical screening examination/treatment/procedure(s) were performed by non-physician practitioner and as supervising physician I was immediately available for consultation/collaboration.   Mervin Kung, MD 05/15/11 [phone]

## 2011-05-22 ENCOUNTER — Ambulatory Visit: Payer: Self-pay | Admitting: Vascular Surgery

## 2011-05-24 ENCOUNTER — Encounter (HOSPITAL_COMMUNITY): Payer: Self-pay | Admitting: *Deleted

## 2011-05-24 ENCOUNTER — Emergency Department (HOSPITAL_COMMUNITY)
Admission: EM | Admit: 2011-05-24 | Discharge: 2011-05-24 | Disposition: A | Discharge status: Home / Self Care | Payer: Medicaid Other | Attending: Emergency Medicine | Admitting: Emergency Medicine

## 2011-05-24 DIAGNOSIS — I129 Hypertensive chronic kidney disease with stage 1 through stage 4 chronic kidney disease, or unspecified chronic kidney disease: Secondary | ICD-10-CM | POA: Insufficient documentation

## 2011-05-24 DIAGNOSIS — M109 Gout, unspecified: Secondary | ICD-10-CM | POA: Insufficient documentation

## 2011-05-24 DIAGNOSIS — N184 Chronic kidney disease, stage 4 (severe): Secondary | ICD-10-CM | POA: Insufficient documentation

## 2011-05-24 DIAGNOSIS — E119 Type 2 diabetes mellitus without complications: Secondary | ICD-10-CM | POA: Insufficient documentation

## 2011-05-24 DIAGNOSIS — Z79899 Other long term (current) drug therapy: Secondary | ICD-10-CM | POA: Insufficient documentation

## 2011-05-24 DIAGNOSIS — M549 Dorsalgia, unspecified: Secondary | ICD-10-CM | POA: Insufficient documentation

## 2011-05-24 MED ORDER — KETOROLAC TROMETHAMINE 60 MG/2ML IM SOLN
60.0000 mg | Freq: Once | INTRAMUSCULAR | Status: AC
  Administered 2011-05-24: 60 mg via INTRAMUSCULAR
  Filled 2011-05-24: qty 2

## 2011-05-24 MED ORDER — PREDNISONE 10 MG PO TABS: 20.0000 mg | ORAL_TABLET | Freq: Two times a day (BID) | ORAL | Status: DC

## 2011-05-24 MED ORDER — OXYCODONE-ACETAMINOPHEN 5-325 MG PO TABS: 2.0000 | ORAL_TABLET | ORAL | Status: AC | PRN

## 2011-05-24 MED ORDER — PREDNISONE 20 MG PO TABS
20.0000 mg | ORAL_TABLET | Freq: Once | ORAL | Status: AC
  Administered 2011-05-24: 20 mg via ORAL
  Filled 2011-05-24: qty 1

## 2011-05-24 MED ORDER — MORPHINE SULFATE 10 MG/ML IJ SOLN
10.0000 mg | Freq: Once | INTRAMUSCULAR | Status: AC
  Administered 2011-05-24: 10 mg via INTRAMUSCULAR
  Filled 2011-05-24: qty 1

## 2011-05-24 NOTE — ED Notes (Signed)
Pt presents with left wrist pain and swelling. Denies injury. Pt also c/o lower back pain, non- radiating and refers to pain as "r/t my kidney failure. However denies flank pain and dialysis.

## 2011-05-24 NOTE — ED Provider Notes (Signed)
History     CSN: [id number]  Arrival date & time 05/24/11  1129   First MD Initiated Contact with Patient 05/24/11 1206      Chief Complaint  Patient presents with  . Wrist Pain  . Back Pain    (Consider location/radiation/quality/duration/timing/severity/associated sxs/prior treatment) HPI Comments: Patient has history of gout, this feels the same.  No injury or trauma.  Patient is a 46 y.o. male presenting with wrist pain. The history is provided by the patient.  Wrist Pain This is a recurrent problem. The current episode started 2 days ago. The problem occurs constantly. The problem has been rapidly worsening. Exacerbated by: movement, palpation. The symptoms are relieved by nothing. He has tried nothing for the symptoms.    Past Medical History  Diagnosis Date  . Hypertension   . Renal insufficiency   . Diabetes mellitus   . Gout   . Chronic back pain   . BPH (benign prostatic hyperplasia)   . Anemia   . DVT (deep venous thrombosis)   . Chronic kidney disease   . Neuropathy   . Decubitus ulcer     of 2nd toes of both feet.  . Edema leg   . Constipation   . Venous (peripheral) insufficiency   . Chronic kidney disease (CKD), stage IV (severe)   . Neuropathy, diabetic   . Physical deconditioning     Past Surgical History  Procedure Date  . None     Family History  Problem Relation Age of Onset  . Diabetes Mother   . Hypertension Mother   . Heart failure Mother   . Hyperlipidemia Mother   . Cancer Father   . Diabetes Father   . Hypertension Father   . Hyperlipidemia Father     History  Substance Use Topics  . Smoking status: Never Smoker   . Smokeless tobacco: Never Used  . Alcohol Use: No      Review of Systems  All other systems reviewed and are negative.    Allergies  Review of patient's allergies indicates no known allergies.  Home Medications   Current Outpatient Rx  Name Route Sig Dispense Refill  . AMITRIPTYLINE HCL 25 MG PO  TABS Oral Take 25 mg by mouth at bedtime.    [name] AMLODIPINE BESYLATE 5 MG PO TABS Oral Take 5 mg by mouth daily as needed. Takes when blood pressure drops.    [name] CALCIUM ACETATE 667 MG PO CAPS Oral Take 667 mg by mouth daily.     [name] VITAMIN D 1000 UNITS PO TABS Oral Take 1,000 Units by mouth daily.    . CORICIDIN HBP DAY/NIGHT COLD PO Oral Take 2 tablets by mouth daily as needed. Cold Symptoms    . FEBUXOSTAT 80 MG PO TABS Oral Take 1 tablet by mouth daily.    . FUROSEMIDE 40 MG PO TABS Oral Take 40 mg by mouth daily.    [name] HYDROCODONE-ACETAMINOPHEN 5-325 MG PO TABS Oral Take 1 tablet by mouth every 6 (six) hours as needed.    [name] METOPROLOL TARTRATE 50 MG PO TABS Oral Take 50 mg by mouth 2 (two) times daily.      [name] PREDNISONE 5 MG PO TABS Oral Take 5 mg by mouth daily.    [name] PROMETHAZINE-CODEINE 6.25-10 MG/5ML PO SYRP Oral Take 5 mLs by mouth every 6 (six) hours as needed for cough. 120 mL 0  . VARDENAFIL HCL 20 MG PO TABS Oral Take 20 mg by mouth daily as  needed. For sexual intercourse      BP 154/98  Pulse 88  Temp(Src) 97.3 F (36.3 C) (Oral)  Resp 20  Ht 5\' 11"  (1.803 m)  Wt 300 lb (136.079 kg)  BMI 41.84 kg/m2  SpO2 100%  Physical Exam  Nursing note and vitals reviewed. Constitutional: He is oriented to person, place, and time. He appears well-developed and well-nourished. No distress.  HENT:  Head: Normocephalic and atraumatic.  Neck: Normal range of motion. Neck supple.  Musculoskeletal:       There is swelling, redness, ttp of the left wrist.  Pain with range of motion.  Neurological: He is alert and oriented to person, place, and time.  Skin: Skin is warm and dry. He is not diaphoretic.    ED Course  Procedures (including critical care time)  Labs Reviewed - No data to display No results found.   No diagnosis found.    MDM  Looks like gout.  Will treat with prednisone, percocet.         Veryl Speak, MD 05/24/11 [phone]

## 2011-05-24 NOTE — Discharge Instructions (Signed)
Gout Gout is an inflammatory condition (arthritis) caused by a buildup of uric acid crystals in the joints. Uric acid is a chemical that is normally present in the blood. Under some circumstances, uric acid can form into crystals in your joints. This causes joint redness, soreness, and swelling (inflammation). Repeat attacks are common. Over time, uric acid crystals can form into masses (tophi) near a joint, causing disfigurement. Gout is treatable and often preventable. CAUSES  The disease begins with elevated levels of uric acid in the blood. Uric acid is produced by your body when it breaks down a naturally found substance called purines. This also happens when you eat certain foods such as meats and fish. Causes of an elevated uric acid level include:  Being passed down from parent to child (heredity).   Diseases that cause increased uric acid production (obesity, psoriasis, some cancers).   Excessive alcohol use.   Diet, especially diets rich in meat and seafood.   Medicines, including certain cancer-fighting drugs (chemotherapy), diuretics, and aspirin.   Chronic kidney disease. The kidneys are no longer able to remove uric acid well.   Problems with metabolism.  Conditions strongly associated with gout include:  Obesity.   High blood pressure.   High cholesterol.   Diabetes.  Not everyone with elevated uric acid levels gets gout. It is not understood why some people get gout and others do not. Surgery, joint injury, and eating too much of certain foods are some of the factors that can lead to gout. SYMPTOMS   An attack of gout comes on quickly. It causes intense pain with redness, swelling, and warmth in a joint.   Fever can occur.   Often, only one joint is involved. Certain joints are more commonly involved:   Base of the big toe.   Knee.   Ankle.   Wrist.   Finger.  Without treatment, an attack usually goes away in a few days to weeks. Between attacks, you  usually will not have symptoms, which is different from many other forms of arthritis. DIAGNOSIS  Your caregiver will suspect gout based on your symptoms and exam. Removal of fluid from the joint (arthrocentesis) is done to check for uric acid crystals. Your caregiver will give you a medicine that numbs the area (local anesthetic) and use a needle to remove joint fluid for exam. Gout is confirmed when uric acid crystals are seen in joint fluid, using a special microscope. Sometimes, blood, urine, and X-ray tests are also used. TREATMENT  There are 2 phases to gout treatment: treating the sudden onset (acute) attack and preventing attacks (prophylaxis). Treatment of an Acute Attack  Medicines are used. These include anti-inflammatory medicines or steroid medicines.   An injection of steroid medicine into the affected joint is sometimes necessary.   The painful joint is rested. Movement can worsen the arthritis.   You may use warm or cold treatments on painful joints, depending which works best for you.   Discuss the use of coffee, vitamin C, or cherries with your caregiver. These may be helpful treatment options.  Treatment to Prevent Attacks After the acute attack subsides, your caregiver may advise prophylactic medicine. These medicines either help your kidneys eliminate uric acid from your body or decrease your uric acid production. You may need to stay on these medicines for a very long time. The early phase of treatment with prophylactic medicine can be associated with an increase in acute gout attacks. For this reason, during the first few months   of treatment, your caregiver may also advise you to take medicines usually used for acute gout treatment. Be sure you understand your caregiver's directions. You should also discuss dietary treatment with your caregiver. Certain foods such as meats and fish can increase uric acid levels. Other foods such as dairy can decrease levels. Your caregiver  can give you a list of foods to avoid. HOME CARE INSTRUCTIONS   Do not take aspirin to relieve pain. This raises uric acid levels.   Only take over-the-counter or prescription medicines for pain, discomfort, or fever as directed by your caregiver.   Rest the joint as much as possible. When in bed, keep sheets and blankets off painful areas.   Keep the affected joint raised (elevated).   Use crutches if the painful joint is in your leg.   Drink enough water and fluids to keep your urine clear or pale yellow. This helps your body get rid of uric acid. Do not drink alcoholic beverages. They slow the passage of uric acid.   Follow your caregiver's dietary instructions. Pay careful attention to the amount of protein you eat. Your daily diet should emphasize fruits, vegetables, whole grains, and fat-free or low-fat milk products.   Maintain a healthy body weight.  SEEK MEDICAL CARE IF:   You have an oral temperature above 102 F (38.9 C).   You develop diarrhea, vomiting, or any side effects from medicines.   You do not feel better in 24 hours, or you are getting worse.  SEEK IMMEDIATE MEDICAL CARE IF:   Your joint becomes suddenly more tender and you have:   Chills.   An oral temperature above 102 F (38.9 C), not controlled by medicine.  MAKE SURE YOU:   Understand these instructions.   Will watch your condition.   Will get help right away if you are not doing well or get worse.  Document Released: 01/18/2000 Document Revised: 01/09/2011 Document Reviewed: 04/30/2009 [employer] Patient Information 2012 [employer]. 

## 2011-06-01 ENCOUNTER — Encounter (HOSPITAL_COMMUNITY): Payer: Self-pay | Admitting: *Deleted

## 2011-06-01 ENCOUNTER — Emergency Department (HOSPITAL_COMMUNITY)
Admission: EM | Admit: 2011-06-01 | Discharge: 2011-06-01 | Disposition: A | Discharge status: Home / Self Care | Payer: Medicaid Other | Attending: Emergency Medicine | Admitting: Emergency Medicine

## 2011-06-01 DIAGNOSIS — L02219 Cutaneous abscess of trunk, unspecified: Secondary | ICD-10-CM | POA: Insufficient documentation

## 2011-06-01 DIAGNOSIS — N400 Enlarged prostate without lower urinary tract symptoms: Secondary | ICD-10-CM | POA: Insufficient documentation

## 2011-06-01 DIAGNOSIS — E1149 Type 2 diabetes mellitus with other diabetic neurological complication: Secondary | ICD-10-CM | POA: Insufficient documentation

## 2011-06-01 DIAGNOSIS — L02211 Cutaneous abscess of abdominal wall: Secondary | ICD-10-CM

## 2011-06-01 DIAGNOSIS — M109 Gout, unspecified: Secondary | ICD-10-CM | POA: Insufficient documentation

## 2011-06-01 DIAGNOSIS — Z79899 Other long term (current) drug therapy: Secondary | ICD-10-CM | POA: Insufficient documentation

## 2011-06-01 DIAGNOSIS — N184 Chronic kidney disease, stage 4 (severe): Secondary | ICD-10-CM | POA: Insufficient documentation

## 2011-06-01 DIAGNOSIS — IMO0002 Reserved for concepts with insufficient information to code with codable children: Secondary | ICD-10-CM | POA: Insufficient documentation

## 2011-06-01 DIAGNOSIS — L89899 Pressure ulcer of other site, unspecified stage: Secondary | ICD-10-CM | POA: Insufficient documentation

## 2011-06-01 DIAGNOSIS — I872 Venous insufficiency (chronic) (peripheral): Secondary | ICD-10-CM | POA: Insufficient documentation

## 2011-06-01 DIAGNOSIS — E1142 Type 2 diabetes mellitus with diabetic polyneuropathy: Secondary | ICD-10-CM | POA: Insufficient documentation

## 2011-06-01 DIAGNOSIS — G589 Mononeuropathy, unspecified: Secondary | ICD-10-CM | POA: Insufficient documentation

## 2011-06-01 MED ORDER — HYDROCODONE-ACETAMINOPHEN 5-325 MG PO TABS: 6.0000 | ORAL_TABLET | Freq: Once | ORAL | Status: DC

## 2011-06-01 MED ORDER — HYDROCODONE-ACETAMINOPHEN 5-500 MG PO TABS: 1.0000 | ORAL_TABLET | Freq: Four times a day (QID) | ORAL | Status: AC | PRN

## 2011-06-01 MED ORDER — SULFAMETHOXAZOLE-TRIMETHOPRIM 800-160 MG PO TABS: 1.0000 | ORAL_TABLET | Freq: Two times a day (BID) | ORAL | Status: AC

## 2011-06-01 MED ORDER — HYDROCODONE-ACETAMINOPHEN 5-325 MG PO TABS
2.0000 | ORAL_TABLET | Freq: Once | ORAL | Status: AC
  Administered 2011-06-01: 2 via ORAL
  Filled 2011-06-01: qty 2

## 2011-06-01 MED ORDER — HYDROCODONE-ACETAMINOPHEN 5-325 MG PO TABS: 2.0000 | ORAL_TABLET | ORAL | Status: DC | PRN

## 2011-06-01 MED ORDER — HYDROCODONE-ACETAMINOPHEN 5-325 MG PO TABS: 2.0000 | ORAL_TABLET | ORAL | Status: AC | PRN

## 2011-06-01 MED ORDER — LIDOCAINE HCL (PF) 1 % IJ SOLN
INTRAMUSCULAR | Status: AC
  Administered 2011-06-01: 5 mL
  Filled 2011-06-01: qty 5

## 2011-06-01 MED ORDER — NAPROXEN 500 MG PO TABS: 500.0000 mg | ORAL_TABLET | Freq: Two times a day (BID) | ORAL | Status: DC

## 2011-06-01 NOTE — ED Provider Notes (Signed)
History     CSN: [id number]  Arrival date & time 06/01/11  0425   First MD Initiated Contact with Patient 06/01/11 [phone]      Chief Complaint  Patient presents with  . Penis Pain    (Consider location/radiation/quality/duration/timing/severity/associated sxs/prior treatment) HPI Comments: 46 year old male with a history of hypertension, diabetes, renal insufficiency who presents with an abscess in his suprapubic region. He states that this evening he woke up and was having some discharge and bleeding from just above his penis. He denies any dysuria, change in bowel habits, fevers vomiting nausea. The symptoms are persistent, nothing makes better, worse with palpation.  Patient is a 47 y.o. male presenting with penile pain. The history is provided by the patient and medical records.  Penis Pain    Past Medical History  Diagnosis Date  . Hypertension   . Renal insufficiency   . Diabetes mellitus   . Gout   . Chronic back pain   . BPH (benign prostatic hyperplasia)   . Anemia   . DVT (deep venous thrombosis)   . Chronic kidney disease   . Neuropathy   . Decubitus ulcer     of 2nd toes of both feet.  . Edema leg   . Constipation   . Venous (peripheral) insufficiency   . Chronic kidney disease (CKD), stage IV (severe)   . Neuropathy, diabetic   . Physical deconditioning     Past Surgical History  Procedure Date  . None     Family History  Problem Relation Age of Onset  . Diabetes Mother   . Hypertension Mother   . Heart failure Mother   . Hyperlipidemia Mother   . Cancer Father   . Diabetes Father   . Hypertension Father   . Hyperlipidemia Father     History  Substance Use Topics  . Smoking status: Never Smoker   . Smokeless tobacco: Never Used  . Alcohol Use: No      Review of Systems  Constitutional: Negative for fever and chills.  Gastrointestinal: Negative for nausea and vomiting.  Genitourinary: Negative for penile pain.  Skin: Positive for  rash.       abscess    Allergies  Review of patient's allergies indicates no known allergies.  Home Medications   Current Outpatient Rx  Name Route Sig Dispense Refill  . AMITRIPTYLINE HCL 25 MG PO TABS Oral Take 25 mg by mouth at bedtime.    [name] AMLODIPINE BESYLATE 5 MG PO TABS Oral Take 5 mg by mouth daily as needed. Takes when blood pressure drops.    [name] CALCIUM ACETATE 667 MG PO CAPS Oral Take 667 mg by mouth daily.     [name] VITAMIN D 1000 UNITS PO TABS Oral Take 1,000 Units by mouth daily.    . CORICIDIN HBP DAY/NIGHT COLD PO Oral Take 2 tablets by mouth daily as needed. Cold Symptoms    . FEBUXOSTAT 80 MG PO TABS Oral Take 1 tablet by mouth daily.    . FUROSEMIDE 40 MG PO TABS Oral Take 40 mg by mouth daily.    [name] HYDROCODONE-ACETAMINOPHEN 5-325 MG PO TABS Oral Take 1 tablet by mouth every 6 (six) hours as needed.    [name] METOPROLOL TARTRATE 50 MG PO TABS Oral Take 50 mg by mouth 2 (two) times daily.      [name] NAPROXEN 500 MG PO TABS Oral Take 1 tablet (500 mg total) by mouth 2 (two) times daily with a meal. 30 tablet 0  .  OXYCODONE-ACETAMINOPHEN 5-325 MG PO TABS Oral Take 2 tablets by mouth every 4 (four) hours as needed for pain. 15 tablet 0  . PREDNISONE 10 MG PO TABS Oral Take 2 tablets (20 mg total) by mouth 2 (two) times daily. 20 tablet 0  . PREDNISONE 5 MG PO TABS Oral Take 5 mg by mouth daily.    . SULFAMETHOXAZOLE-TRIMETHOPRIM 800-160 MG PO TABS Oral Take 1 tablet by mouth every 12 (twelve) hours. 20 tablet 0  . VARDENAFIL HCL 20 MG PO TABS Oral Take 20 mg by mouth daily as needed. For sexual intercourse      BP 149/110  Pulse 95  Temp(Src) 100 F (37.8 C) (Oral)  Resp 20  Ht 5\' 11"  (1.803 m)  Wt 300 lb (136.079 kg)  BMI 41.84 kg/m2  SpO2 96%  Physical Exam  Nursing note and vitals reviewed. Constitutional: He appears well-developed and well-nourished. No distress.  HENT:  Head: Normocephalic and atraumatic.  Eyes: Conjunctivae are normal. Right eye exhibits no  discharge. Left eye exhibits no discharge. No scleral icterus.  Cardiovascular: Normal rate and regular rhythm.   No murmur heard. Pulmonary/Chest: Effort normal and breath sounds normal.  Musculoskeletal: He exhibits no edema.  Skin: Skin is warm and dry. Rash ( Mild induration in the suprapubic area, central fluctuant abscess which has a very small area of spontaneous drainage.) noted. He is not diaphoretic.    ED Course  Procedures (including critical care time)  INCISION AND DRAINAGE Performed by: Noemi Chapel D Consent: Verbal consent obtained. Risks and benefits: risks, benefits and alternatives were discussed Type: abscess  Body area: Suprapubic area  Anesthesia: local infiltration  Local anesthetic: lidocaine 1 % with epinephrine  Anesthetic total: 2 ml  Complexity: complex Blunt dissection to break up loculations Irrigation with sterile normal saline   Drainage: purulent  Drainage amount: Moderate   Packing material: 1/2 in iodoform gauze  Patient tolerance: Patient tolerated the procedure well with no immediate complications.     Labs Reviewed - No data to display No results found.   1. Abscess of abdominal wall       MDM   abscess drained, packed with iodoform gauze, Bactrim started, Vicodin given in emergency department. Patient is afebrile, not tachycardic and not hypotensive. Informed of the need for today followup for wound check.  Discharge Prescriptions include:  #1 Naprosyn #2 Bactrim DS        Johnna Acosta, MD 06/01/11 [phone]

## 2011-06-01 NOTE — Discharge Instructions (Signed)
You have an infection in your lower abdominal wall cough and abscess. Please read the attached instructions for further instructions on how to care for your infection. Take the antibiotic called Bactrim twice a day for 10 days, pain medication as prescribed, you must have your wound reevaluated in the next 2 days for a recheck. Please perform warm soaks either in a bathtub or with a warm compress several times a day.

## 2011-06-01 NOTE — ED Notes (Signed)
Pt reports he woke up this am to go to the bathroom and he noticed bleeding from his penis

## 2011-06-04 ENCOUNTER — Encounter (HOSPITAL_COMMUNITY): Payer: Self-pay | Admitting: *Deleted

## 2011-06-04 ENCOUNTER — Emergency Department (HOSPITAL_COMMUNITY)
Admission: EM | Admit: 2011-06-04 | Discharge: 2011-06-04 | Disposition: A | Discharge status: Home / Self Care | Payer: Medicaid Other | Attending: Emergency Medicine | Admitting: Emergency Medicine

## 2011-06-04 DIAGNOSIS — L02219 Cutaneous abscess of trunk, unspecified: Secondary | ICD-10-CM | POA: Insufficient documentation

## 2011-06-04 DIAGNOSIS — I129 Hypertensive chronic kidney disease with stage 1 through stage 4 chronic kidney disease, or unspecified chronic kidney disease: Secondary | ICD-10-CM | POA: Insufficient documentation

## 2011-06-04 DIAGNOSIS — M109 Gout, unspecified: Secondary | ICD-10-CM | POA: Insufficient documentation

## 2011-06-04 DIAGNOSIS — E1149 Type 2 diabetes mellitus with other diabetic neurological complication: Secondary | ICD-10-CM | POA: Insufficient documentation

## 2011-06-04 DIAGNOSIS — N400 Enlarged prostate without lower urinary tract symptoms: Secondary | ICD-10-CM | POA: Insufficient documentation

## 2011-06-04 DIAGNOSIS — L0291 Cutaneous abscess, unspecified: Secondary | ICD-10-CM

## 2011-06-04 DIAGNOSIS — E1142 Type 2 diabetes mellitus with diabetic polyneuropathy: Secondary | ICD-10-CM | POA: Insufficient documentation

## 2011-06-04 DIAGNOSIS — Z86718 Personal history of other venous thrombosis and embolism: Secondary | ICD-10-CM | POA: Insufficient documentation

## 2011-06-04 DIAGNOSIS — N184 Chronic kidney disease, stage 4 (severe): Secondary | ICD-10-CM | POA: Insufficient documentation

## 2011-06-04 NOTE — ED Provider Notes (Signed)
History     CSN: [id number]  Arrival date & time 06/04/11  1646   First MD Initiated Contact with Patient 06/04/11 1703      Chief Complaint  Patient presents with  . Wound Check    (Consider location/radiation/quality/duration/timing/severity/associated sxs/prior treatment) Patient is a 46 y.o. male presenting with wound check. The history is provided by the patient.  Wound Check  He was treated in the ED 3 to 5 days ago. Previous treatment in the ED includes I&D of abscess. Treatments since wound repair include oral antibiotics. Fever duration: no fever. There has been colored discharge from the wound. The redness has improved. The swelling has improved. The pain has improved.    Past Medical History  Diagnosis Date  . Hypertension   . Renal insufficiency   . Diabetes mellitus   . Gout   . Chronic back pain   . BPH (benign prostatic hyperplasia)   . Anemia   . DVT (deep venous thrombosis)   . Chronic kidney disease   . Neuropathy   . Decubitus ulcer     of 2nd toes of both feet.  . Edema leg   . Constipation   . Venous (peripheral) insufficiency   . Chronic kidney disease (CKD), stage IV (severe)   . Neuropathy, diabetic   . Physical deconditioning     Past Surgical History  Procedure Date  . None     Family History  Problem Relation Age of Onset  . Diabetes Mother   . Hypertension Mother   . Heart failure Mother   . Hyperlipidemia Mother   . Cancer Father   . Diabetes Father   . Hypertension Father   . Hyperlipidemia Father     History  Substance Use Topics  . Smoking status: Never Smoker   . Smokeless tobacco: Never Used  . Alcohol Use: No      Review of Systems  Constitutional: Negative for fever and chills.  Gastrointestinal: Negative for vomiting and abdominal pain.  Genitourinary: Negative for difficulty urinating.  Skin:       abscess  Neurological: Negative for weakness and numbness.  All other systems reviewed and are  negative.    Allergies  Review of patient's allergies indicates no known allergies.  Home Medications   Current Outpatient Rx  Name Route Sig Dispense Refill  . AMITRIPTYLINE HCL 25 MG PO TABS Oral Take 25 mg by mouth at bedtime.    [name] AMLODIPINE BESYLATE 5 MG PO TABS Oral Take 5 mg by mouth daily as needed. Takes when blood pressure drops.    [name] CALCIUM ACETATE 667 MG PO CAPS Oral Take 667 mg by mouth daily.     [name] VITAMIN D 1000 UNITS PO TABS Oral Take 1,000 Units by mouth daily.    . CORICIDIN HBP DAY/NIGHT COLD PO Oral Take 2 tablets by mouth daily as needed. Cold Symptoms    . FEBUXOSTAT 80 MG PO TABS Oral Take 1 tablet by mouth daily.    . FUROSEMIDE 40 MG PO TABS Oral Take 40 mg by mouth daily.    [name] HYDROCODONE-ACETAMINOPHEN 5-325 MG PO TABS Oral Take 1 tablet by mouth every 6 (six) hours as needed.    [name] HYDROCODONE-ACETAMINOPHEN 5-325 MG PO TABS Oral Take 2 tablets by mouth every 4 (four) hours as needed for pain. 6 tablet 0  . HYDROCODONE-ACETAMINOPHEN 5-500 MG PO TABS Oral Take 1-2 tablets by mouth every 6 (six) hours as needed for pain. 8 tablet 0  .  METOPROLOL TARTRATE 50 MG PO TABS Oral Take 50 mg by mouth 2 (two) times daily.      . OXYCODONE-ACETAMINOPHEN 5-325 MG PO TABS Oral Take 2 tablets by mouth every 4 (four) hours as needed for pain. 15 tablet 0  . PREDNISONE 10 MG PO TABS Oral Take 2 tablets (20 mg total) by mouth 2 (two) times daily. 20 tablet 0  . PREDNISONE 5 MG PO TABS Oral Take 5 mg by mouth daily.    . SULFAMETHOXAZOLE-TRIMETHOPRIM 800-160 MG PO TABS Oral Take 1 tablet by mouth every 12 (twelve) hours. 20 tablet 0  . VARDENAFIL HCL 20 MG PO TABS Oral Take 20 mg by mouth daily as needed. For sexual intercourse      BP 159/106  Pulse 75  Temp(Src) 97.7 F (36.5 C) (Oral)  Resp 20  Ht 5\' 11"  (1.803 m)  Wt 300 lb (136.079 kg)  BMI 41.84 kg/m2  SpO2 99%  Physical Exam  Nursing note and vitals reviewed. Constitutional: He is oriented to person, place, and  time. He appears well-developed and well-nourished. No distress.  HENT:  Head: Normocephalic and atraumatic.  Cardiovascular: Normal rate, regular rhythm and normal heart sounds.   Pulmonary/Chest: Effort normal and breath sounds normal. No respiratory distress.  Abdominal: Soft. Bowel sounds are normal. He exhibits no distension. There is no tenderness.  Musculoskeletal: Normal range of motion.  Neurological: He is alert and oriented to person, place, and time.  Skin:       Small abscess to the suprapubic area with packing in place.  Appears to be healing well.  No surrounding erythema    ED Course  Procedures (including critical care time)       MDM    Previous medical charts, nursing notes and vitals signs from this visit were reviewed by me   All laboratory results and/or imaging results performed on this visit, if applicable, were reviewed by me and discussed with the patient and/or parent as well as recommendation for follow-up    MEDICATIONS GIVEN IN ED:  none  Abscess of suprapubic area with previous I&D performed.  Packing removed by me w/o difficulty.  Appears to be healing well.  No surrounding erythema.  Pt currently taking bactrim.  Pt advised to apply warm wet compresses or soaks.        PRESCRIPTIONS GIVEN AT DISCHARGE:  none     Pt stable in ED with no significant deterioration in condition. Pt feels improved after observation and/or treatment in ED. Patient / Family / Caregiver understand and agree with initial ED impression and plan with expectations set for ED visit.  Patient agrees to return to ED for any worsening symptoms        Macil Crady L. [place], [place] 06/07/11 2310

## 2011-06-04 NOTE — ED Notes (Signed)
Recheck of abscess to abd.  Here for recheck. And packing to be removed

## 2011-06-04 NOTE — Discharge Instructions (Signed)
Abscess  Care After  An abscess (also called a boil or furuncle) is an infected area that contains a collection of pus. Signs and symptoms of an abscess include pain, tenderness, redness, or hardness, or you may feel a moveable soft area under your skin. An abscess can occur anywhere in the body. The infection may spread to surrounding tissues causing cellulitis. A cut (incision) by the surgeon was made over your abscess and the pus was drained out. Gauze may have been packed into the space to provide a drain that will allow the cavity to heal from the inside outwards. The boil may be painful for 5 to 7 days. Most people with a boil do not have high fevers. Your abscess, if seen early, may not have localized, and may not have been lanced. If not, another appointment may be required for this if it does not get better on its own or with medications.  HOME CARE INSTRUCTIONS     Only take over-the-counter or prescription medicines for pain, discomfort, or fever as directed by your caregiver.    When you bathe, soak and then remove gauze or iodoform packs at least daily or as directed by your caregiver. You may then wash the wound gently with mild soapy water. Repack with gauze or do as your caregiver directs.   SEEK IMMEDIATE MEDICAL CARE IF:     You develop increased pain, swelling, redness, drainage, or bleeding in the wound site.    You develop signs of generalized infection including muscle aches, chills, fever, or a general ill feeling.    An oral temperature above 102 F (38.9 C) develops, not controlled by medication.   See your caregiver for a recheck if you develop any of the symptoms described above. If medications (antibiotics) were prescribed, take them as directed.  Document Released: 08/08/2004 Document Revised: 01/09/2011 Document Reviewed: 04/05/2007  [employer] Patient Information 2012 [employer].

## 2011-06-08 NOTE — ED Provider Notes (Signed)
Medical screening examination/treatment/procedure(s) were conducted as a shared visit with non-physician practitioner(s) and myself.  I personally evaluated the patient during the encounter   Maudry Diego, MD 06/08/11 [phone]

## 2011-07-22 IMAGING — CR DG CHEST 1V PORT
1 series · 1 of 1 positions shown · non-contrast
Comparison: Chest x-ray 11/02/2009.

CLINICAL DATA: Chest pain and cough.

PORTABLE CHEST - 1 VIEW

[view not recorded]
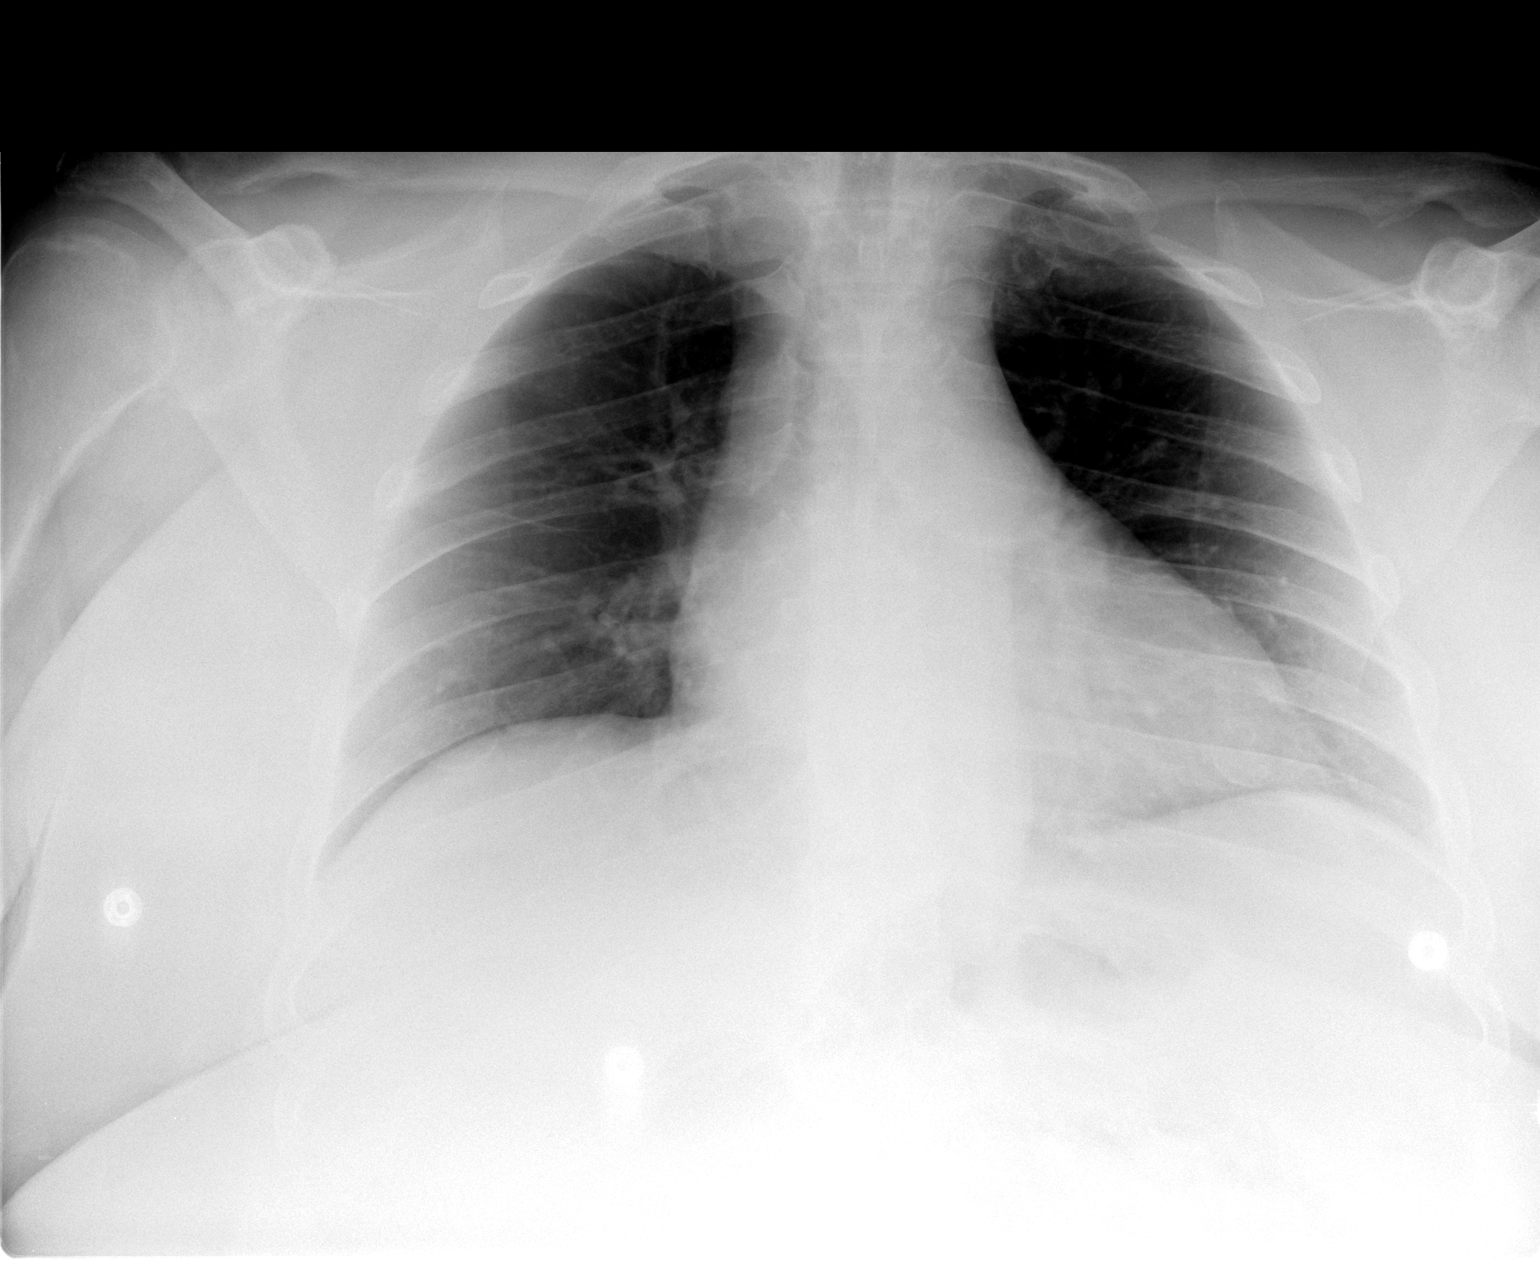

[1 of 1 positions shown; findings below may reference images not displayed]

FINDINGS: The cardiac silhouette, mediastinal and hilar contours
are within normal limits and stable.  The lungs are clear.  The
bony thorax is intact.
IMPRESSION: No acute cardiopulmonary findings.

## 2011-08-18 ENCOUNTER — Ambulatory Visit (HOSPITAL_COMMUNITY)
Admission: RE | Admit: 2011-08-18 | Discharge: 2011-08-18 | Disposition: A | Discharge status: Home / Self Care | Payer: Medicare Other | Source: Ambulatory Visit | Attending: Internal Medicine | Admitting: Internal Medicine

## 2011-08-18 DIAGNOSIS — M6281 Muscle weakness (generalized): Secondary | ICD-10-CM | POA: Insufficient documentation

## 2011-08-18 DIAGNOSIS — R262 Difficulty in walking, not elsewhere classified: Secondary | ICD-10-CM | POA: Insufficient documentation

## 2011-08-18 DIAGNOSIS — R269 Unspecified abnormalities of gait and mobility: Secondary | ICD-10-CM | POA: Insufficient documentation

## 2011-08-18 DIAGNOSIS — R29898 Other symptoms and signs involving the musculoskeletal system: Secondary | ICD-10-CM | POA: Insufficient documentation

## 2011-08-18 DIAGNOSIS — IMO0001 Reserved for inherently not codable concepts without codable children: Secondary | ICD-10-CM | POA: Insufficient documentation

## 2011-08-18 DIAGNOSIS — R2689 Other abnormalities of gait and mobility: Secondary | ICD-10-CM | POA: Insufficient documentation

## 2011-08-18 NOTE — Evaluation (Signed)
Physical Therapy Evaluation  Patient Details  Name: Jack Irwin MRN: [id number] Date of Birth: 07-25-1965  Today's Date: 08/18/2011 Time: [phone] PT Time Calculation (min): 55 min  Visit#: 1  of 8   Re-eval: 09/17/11 Assessment Diagnosis: difficulty walking Next MD Visit: 11/18/11 Prior Therapy: [email or web address]   Past Medical History:  Past Medical History  Diagnosis Date  . Hypertension   . Renal insufficiency   . Diabetes mellitus   . Gout   . Chronic back pain   . BPH (benign prostatic hyperplasia)   . Anemia   . DVT (deep venous thrombosis)   . Chronic kidney disease   . Neuropathy   . Decubitus ulcer     of 2nd toes of both feet.  . Edema leg   . Constipation   . Venous (peripheral) insufficiency   . Chronic kidney disease (CKD), stage IV (severe)   . Neuropathy, diabetic   . Physical deconditioning    Past Surgical History:  Past Surgical History  Procedure Date  . None     Subjective Symptoms/Limitations Symptoms: Mr. Buckalew states that he has been having standing for prolong periods of time.  He states that it is difficult for him to go for sit to stand and to sit down gently he more or less plops down. How long can you sit comfortably?: the patient states that he does not have problems sitting How long can you stand comfortably?: The patient states that he can stand for two to three minutes. How long can you walk comfortably?: The patient states that he is able to walk for five minutes or less. Pain Assessment Currently in Pain?: Yes Pain Score:   3 Pain Location: Knee Pain Orientation: Right;Left Pain Type: Chronic pain  Precautions/Restrictions  Precautions Precautions: Fall  Prior Functioning  Home Living Lives With: Spouse Home Access: Stairs to enter Technical brewer of Steps:  (6) Entrance Stairs-Rails: Right Home Layout: One level Bathroom Shower/Tub: Tub/shower unit Additional Comments: Pt goes up and down steps  sideways  Cognition/Observation Cognition Overall Cognitive Status: Appears within functional limits for tasks assessed  Sensation/Coordination/Flexibility/Functional Tests Functional Tests Functional Tests: sit to stand 2/30 Functional Tests: LEFS  Assessment RLE AROM (degrees) Right Knee Extension: -12  RLE Strength Right Hip Flexion: 5/5 Right Hip Extension: 3/5 Right Hip ABduction: 5/5 Right Hip ADduction: 4/5 Right Knee Flexion: 4/5 Right Knee Extension: 4/5 Right Ankle Dorsiflexion: 5/5 Right Ankle Plantar Flexion: 4/5 LLE AROM (degrees) Left Knee Extension: -15  LLE Strength Left Hip Flexion: 5/5 Left Hip Extension: 3/5 Left Hip ABduction: 5/5 Left Hip ADduction: 4/5 Left Knee Flexion: 5/5 Left Knee Extension: 5/5 Left Ankle Dorsiflexion: 5/5 Left Ankle Plantar Flexion: 4/5  Exercise/Treatments Mobility/Balance  Berg Balance Test Sit to Stand: Able to stand without using hands and stabilize independently Standing Unsupported: Able to stand safely 2 minutes Sitting with Back Unsupported but Feet Supported on Floor or Stool: Able to sit safely and securely 2 minutes Stand to Sit: Uses backs of legs against chair to control descent Transfers: Able to transfer safely, definite need of hands Standing Unsupported with Eyes Closed: Able to stand 10 seconds with supervision Standing Ubsupported with Feet Together: Able to place feet together independently and stand for 1 minute with supervision From Standing, Reach Forward with Outstretched Arm: Can reach forward >5 cm safely (2") From Standing Position, Pick up Object from Floor: Able to pick up shoe, needs supervision From Standing Position, Turn to Look Behind Over each Shoulder: Turn sideways  only but maintains balance Turn 360 Degrees: Able to turn 360 degrees safely one side only in 4 seconds or less Standing Unsupported, Alternately Place Feet on Step/Stool: Able to stand independently and safely and complete 8  steps in 20 seconds Standing Unsupported, One Foot in Front: Able to plae foot ahead of the other independently and hold 30 seconds Standing on One Leg: Tries to lift leg/unable to hold 3 seconds but remains standing independently Total Score: 41  Timed Up and Go Test TUG: Normal TUG Normal TUG (seconds): 15    Supine Bridges: 10 reps Sidelying Hip ABduction: 10 reps Prone  Hip Extension: 10 reps      Physical Therapy Assessment and Plan PT Assessment and Plan Clinical Impression Statement: Pt with decreased balance, hipmm strength and mobility who will benefit from skilled PT to improve functional mobility Pt will benefit from skilled therapeutic intervention in order to improve on the following deficits: Abnormal gait;Decreased activity tolerance;Decreased balance;Decreased strength;Difficulty walking Rehab Potential: Good PT Frequency: Min 2X/week PT Duration: 4 weeks PT Treatment/Interventions: Gait training;Therapeutic exercise;Neuromuscular re-education;Balance training;Patient/family education PT Plan: Begin TM, sit to stand, minisquat, heel raises, tandem gt retro gt and side stepping with t-band next visit; 3rd visit begin leg press, cybex ham and quad strengthening    Goals Home Exercise Program Pt will Perform Home Exercise Program: Independently PT Short Term Goals Time to Complete Short Term Goals: 2 weeks PT Short Term Goal 1: Pt to be able to come sit to stand 4 x PT Short Term Goal 2: Pt mm strength improved 1/2 grade PT Short Term Goal 3: Pt able to walk for 10 minutes straight PT Long Term Goals PT Long Term Goal 1: Pt able to come sit to stand without UE support upon one try. PT Long Term Goal 2: Pt strength to be increased by one grade Long Term Goal 3: Pt able to walk for 30 minutes without stopping Long Term Goal 4: Pt able to stand for 10 minutes PT Long Term Goal 5: LEFS incresed by 10  Additional PT Long Term Goals?: Yes PT Long Term Goal 6: sit to  stand 6 times in 30 sec PT Long Term Goal 7: Berg balance to be increased by 10  Problem List Patient Active Problem List  Diagnosis  . Difficulty in walking  . Weakness of both legs  . Poor Balance    PT - End of Session Equipment Utilized During Treatment: Gait belt Activity Tolerance: Patient tolerated treatment well General Behavior During Session: [facility] for tasks performed Cognition: [facility] for tasks performed PT Plan of Care PT Home Exercise Plan: given Consulted and Agree with Plan of Care: Patient  GP Functional Assessment Tool Used: LEFS/ sit to stand Functional Limitation: Mobility: Walking and moving around Mobility: Walking and Moving Around Current Status [id number]): At least 40 percent but less than 60 percent impaired, limited or restricted Mobility: Walking and Moving Around Goal Status [phone]): At least 1 percent but less than 20 percent impaired, limited or restricted  RUSSELL,CINDY 08/18/2011, 4:44 PM  Physician Documentation Your signature is required to indicate approval of the treatment plan as stated above.  Please sign and either send electronically or make a copy of this report for your files and return this physician signed original.   Please mark one 1.__approve of plan  2. ___approve of plan with the following conditions.   ______________________________  _____________________ Physician Signature                                                                                                             Date

## 2011-08-19 ENCOUNTER — Encounter (HOSPITAL_COMMUNITY): Payer: Self-pay

## 2011-08-19 ENCOUNTER — Emergency Department (HOSPITAL_COMMUNITY)
Admission: EM | Admit: 2011-08-19 | Discharge: 2011-08-19 | Disposition: A | Discharge status: Home / Self Care | Payer: Medicare Other | Attending: Emergency Medicine | Admitting: Emergency Medicine

## 2011-08-19 DIAGNOSIS — N492 Inflammatory disorders of scrotum: Secondary | ICD-10-CM

## 2011-08-19 DIAGNOSIS — E1149 Type 2 diabetes mellitus with other diabetic neurological complication: Secondary | ICD-10-CM | POA: Insufficient documentation

## 2011-08-19 DIAGNOSIS — I129 Hypertensive chronic kidney disease with stage 1 through stage 4 chronic kidney disease, or unspecified chronic kidney disease: Secondary | ICD-10-CM | POA: Insufficient documentation

## 2011-08-19 DIAGNOSIS — N498 Inflammatory disorders of other specified male genital organs: Secondary | ICD-10-CM | POA: Insufficient documentation

## 2011-08-19 DIAGNOSIS — Z79899 Other long term (current) drug therapy: Secondary | ICD-10-CM | POA: Insufficient documentation

## 2011-08-19 DIAGNOSIS — N184 Chronic kidney disease, stage 4 (severe): Secondary | ICD-10-CM | POA: Insufficient documentation

## 2011-08-19 DIAGNOSIS — Z86718 Personal history of other venous thrombosis and embolism: Secondary | ICD-10-CM | POA: Insufficient documentation

## 2011-08-19 DIAGNOSIS — M109 Gout, unspecified: Secondary | ICD-10-CM | POA: Insufficient documentation

## 2011-08-19 DIAGNOSIS — E1142 Type 2 diabetes mellitus with diabetic polyneuropathy: Secondary | ICD-10-CM | POA: Insufficient documentation

## 2011-08-19 DIAGNOSIS — N400 Enlarged prostate without lower urinary tract symptoms: Secondary | ICD-10-CM | POA: Insufficient documentation

## 2011-08-19 LAB — URINE MICROSCOPIC-ADD ON

## 2011-08-19 LAB — CBC WITH DIFFERENTIAL/PLATELET
Basophils Relative: 0 % (ref 0–1)
Eosinophils Absolute: 0 10*3/uL (ref 0.0–0.7)
HCT: 34.9 % — ABNORMAL LOW (ref 39.0–52.0)
Hemoglobin: 11.1 g/dL — ABNORMAL LOW (ref 13.0–17.0)
Lymphs Abs: 1.3 10*3/uL (ref 0.7–4.0)
MCH: 26.6 pg (ref 26.0–34.0)
MCHC: 31.8 g/dL (ref 30.0–36.0)
MCV: 83.5 fL (ref 78.0–100.0)
Monocytes Absolute: 0.8 10*3/uL (ref 0.1–1.0)
Monocytes Relative: 5 % (ref 3–12)
RBC: 4.18 MIL/uL — ABNORMAL LOW (ref 4.22–5.81)

## 2011-08-19 LAB — URINALYSIS, ROUTINE W REFLEX MICROSCOPIC
Bilirubin Urine: NEGATIVE
Hgb urine dipstick: NEGATIVE
Ketones, ur: NEGATIVE mg/dL
Nitrite: NEGATIVE
Specific Gravity, Urine: 1.02 (ref 1.005–1.030)
Urobilinogen, UA: 0.2 mg/dL (ref 0.0–1.0)
pH: 5.5 (ref 5.0–8.0)

## 2011-08-19 LAB — BASIC METABOLIC PANEL
BUN: 59 mg/dL — ABNORMAL HIGH (ref 6–23)
Creatinine, Ser: 3.14 mg/dL — ABNORMAL HIGH (ref 0.50–1.35)
GFR calc Af Amer: 26 mL/min — ABNORMAL LOW (ref >90)
GFR calc non Af Amer: 23 mL/min — ABNORMAL LOW (ref >90)
Glucose, Bld: 139 mg/dL — ABNORMAL HIGH (ref 70–99)

## 2011-08-19 MED ORDER — LIDOCAINE HCL (PF) 2 % IJ SOLN
10.0000 mL | Freq: Once | INTRAMUSCULAR | Status: AC
  Administered 2011-08-19: 10 mL
  Filled 2011-08-19 (×3): qty 10

## 2011-08-19 MED ORDER — OXYCODONE-ACETAMINOPHEN 5-325 MG PO TABS: 1.0000 | ORAL_TABLET | ORAL | Status: AC | PRN

## 2011-08-19 MED ORDER — SULFAMETHOXAZOLE-TRIMETHOPRIM 800-160 MG PO TABS: 1.0000 | ORAL_TABLET | Freq: Two times a day (BID) | ORAL | Status: AC

## 2011-08-19 NOTE — ED Notes (Signed)
Pt reports noticed painful knot in r groin area since yesterday.  Reports area is tender to touch.

## 2011-08-19 NOTE — ED Notes (Signed)
Patient's incision to scrotum dressed with gauze and hyperflex. Mesh underwear given.

## 2011-08-19 NOTE — ED Provider Notes (Signed)
History    This chart was scribed for Jack Apo, MD, MD by Rhae Lerner. The patient was seen in room APA03 and the patient's care was started at 2:04PM.   CSN: [id number]  Arrival date & time 08/19/11  1307   First MD Initiated Contact with Patient 08/19/11 1358      Chief Complaint  Patient presents with  . Abscess    (Consider location/radiation/quality/duration/timing/severity/associated sxs/prior treatment) Patient is a 46 y.o. male presenting with abscess. The history is provided by the patient.  Abscess    Jack Irwin is a 46 y.o. male who presents to the Emergency Department complaining of moderate abscess in right groin area onset 2 days ago. Pt reports having hx of abscesses in groin. He reports that he has moderate tenderness in the area of the abscess. Pt reports hx of gout, arthritis, diabetes (controlled by diet), kidney problems (no dialysis), HTN. Denies smoking and drinking. Reports that his glucose last week was 115.  Denies allergies to medication.   Past Medical History  Diagnosis Date  . Hypertension   . Renal insufficiency   . Diabetes mellitus   . Gout   . Chronic back pain   . BPH (benign prostatic hyperplasia)   . Anemia   . DVT (deep venous thrombosis)   . Chronic kidney disease   . Neuropathy   . Decubitus ulcer     of 2nd toes of both feet.  . Edema leg   . Constipation   . Venous (peripheral) insufficiency   . Chronic kidney disease (CKD), stage IV (severe)   . Neuropathy, diabetic   . Physical deconditioning     Past Surgical History  Procedure Date  . None     Family History  Problem Relation Age of Onset  . Diabetes Mother   . Hypertension Mother   . Heart failure Mother   . Hyperlipidemia Mother   . Cancer Father   . Diabetes Father   . Hypertension Father   . Hyperlipidemia Father     History  Substance Use Topics  . Smoking status: Never Smoker   . Smokeless tobacco: Never Used  . Alcohol Use: No       Review of Systems  All other systems reviewed and are negative.  10 Systems reviewed and all are negative for acute change except as noted in the HPI.    Allergies  Review of patient's allergies indicates no known allergies.  Home Medications   Current Outpatient Rx  Name Route Sig Dispense Refill  . ALLOPURINOL 300 MG PO TABS Oral Take 300 mg by mouth daily.    [name] AMITRIPTYLINE HCL 25 MG PO TABS Oral Take 25 mg by mouth at bedtime.    [name] CALCIUM ACETATE 667 MG PO CAPS Oral Take 1,334 mg by mouth 3 (three) times daily.     [name] VITAMIN D 1000 UNITS PO TABS Oral Take 1,000 Units by mouth daily.    . FEBUXOSTAT 80 MG PO TABS Oral Take 1 tablet by mouth daily.    . FUROSEMIDE 40 MG PO TABS Oral Take 40 mg by mouth daily.    [name] HYDROCODONE-ACETAMINOPHEN 5-325 MG PO TABS Oral Take 1 tablet by mouth every 6 (six) hours as needed.    [name] METOPROLOL TARTRATE 50 MG PO TABS Oral Take 50 mg by mouth 2 (two) times daily.      [name] PREDNISONE 10 MG PO TABS Oral Take 10 mg by mouth daily.    [name]  VARDENAFIL HCL 20 MG PO TABS Oral Take 20 mg by mouth daily as needed. For sexual intercourse      BP 146/99  Pulse 81  Temp 98.2 F (36.8 C) (Oral)  Resp 20  Ht 6' (1.829 m)  Wt 300 lb (136.079 kg)  BMI 40.69 kg/m2  SpO2 100%  Physical Exam  Nursing note and vitals reviewed. Constitutional: He is oriented to person, place, and time. He appears well-developed and well-nourished. No distress.  HENT:  Head: Normocephalic and atraumatic.  Eyes: EOM are normal. Pupils are equal, round, and reactive to light.  Neck: Normal range of motion. Neck supple. No tracheal deviation present.  Cardiovascular: Normal rate.   Pulmonary/Chest: Effort normal. No respiratory distress.  Abdominal: Soft. He exhibits no distension.  Musculoskeletal: Normal range of motion.  Neurological: He is alert and oriented to person, place, and time.  Skin: Skin is warm and dry.       2 cm diameter abscess on skin of scrotum  on right side  No surrounding cellulitis   Psychiatric: He has a normal mood and affect. His behavior is normal.    ED Course  INCISION AND DRAINAGE Date/Time: 08/19/2011 2:40 PM Performed by: Jack Irwin Authorized by: Jack Irwin Consent: Verbal consent obtained. Written consent not obtained. Risks and benefits: risks, benefits and alternatives were discussed Consent given by: patient Patient understanding: patient states understanding of the procedure being performed Patient consent: the patient's understanding of the procedure matches consent given Site marked: the operative site was not marked Patient identity confirmed: verbally with patient Time out: Immediately prior to procedure a "time out" was called to verify the correct patient, procedure, equipment, support staff and site/side marked as required. Type: abscess Body area: anogenital Location details: scrotal wall Anesthesia: local infiltration Local anesthetic: lidocaine 2% without epinephrine Patient sedated: no Scalpel size: 11 Needle gauge: 22 Incision type: single straight Complexity: simple Drainage: purulent Drainage amount: moderate Wound treatment: drain placed Packing material: 1/4 in iodoform gauze Patient tolerance: Patient tolerated the procedure well with no immediate complications.   (including critical care time) DIAGNOSTIC STUDIES: Oxygen Saturation is 100% on room air, normal by my interpretation.    COORDINATION OF CARE: 2:09PM EDP discusses pt ED treatment with pt  2:09PM EDP orders medication: xylocaine 2% 10 ml  2:42 PM I&D of scrotal abscess performed.  Waiting for lab workup.     3:23 PM Results for orders placed during the hospital encounter of 08/19/11  CBC WITH DIFFERENTIAL      Component Value Range   WBC 16.6 (*) 4.0 - 10.5 K/uL   RBC 4.18 (*) 4.22 - 5.81 MIL/uL   Hemoglobin 11.1 (*) 13.0 - 17.0 g/dL   HCT 34.9 (*) 39.0 - 52.0 %   MCV 83.5  78.0 - 100.0 fL    MCH 26.6  26.0 - 34.0 pg   MCHC 31.8  30.0 - 36.0 g/dL   RDW 19.5 (*) 11.5 - 15.5 %   Platelets 223  150 - 400 K/uL   Neutrophils Relative 87 (*) 43 - 77 %   Neutro Abs 14.4 (*) 1.7 - 7.7 K/uL   Lymphocytes Relative 8 (*) 12 - 46 %   Lymphs Abs 1.3  0.7 - 4.0 K/uL   Monocytes Relative 5  3 - 12 %   Monocytes Absolute 0.8  0.1 - 1.0 K/uL   Eosinophils Relative 0  0 - 5 %   Eosinophils Absolute 0.0  0.0 - 0.7  K/uL   Basophils Relative 0  0 - 1 %   Basophils Absolute 0.0  0.0 - 0.1 K/uL  BASIC METABOLIC PANEL      Component Value Range   Sodium 130 (*) 135 - 145 mEq/L   Potassium 4.3  3.5 - 5.1 mEq/L   Chloride 100  96 - 112 mEq/L   CO2 20  19 - 32 mEq/L   Glucose, Bld 139 (*) 70 - 99 mg/dL   BUN 59 (*) 6 - 23 mg/dL   Creatinine, Ser 3.14 (*) 0.50 - 1.35 mg/dL   Calcium 9.4  8.4 - 10.5 mg/dL   GFR calc non Af Amer 23 (*) >90 mL/min   GFR calc Af Amer 26 (*) >90 mL/min  URINALYSIS, ROUTINE W REFLEX MICROSCOPIC      Component Value Range   Color, Urine YELLOW  YELLOW   APPearance CLEAR  CLEAR   Specific Gravity, Urine 1.020  1.005 - 1.030   pH 5.5  5.0 - 8.0   Glucose, UA NEGATIVE  NEGATIVE mg/dL   Hgb urine dipstick NEGATIVE  NEGATIVE   Bilirubin Urine NEGATIVE  NEGATIVE   Ketones, ur NEGATIVE  NEGATIVE mg/dL   Protein, ur 30 (*) NEGATIVE mg/dL   Urobilinogen, UA 0.2  0.0 - 1.0 mg/dL   Nitrite NEGATIVE  NEGATIVE   Leukocytes, UA NEGATIVE  NEGATIVE  URINE MICROSCOPIC-ADD ON      Component Value Range   Squamous Epithelial / LPF RARE  RARE   Bacteria, UA RARE  RARE   Casts HYALINE CASTS (*) NEGATIVE   Lab results show stable renal insufficiency, blood sugar mildly elevated at 139.  Advised Rx with Bactrim DS, Percocet for pain, recheck for removal of packing in 3 days.   1. Scrotal wall abscess     I personally performed the services described in this documentation, which was scribed in my presence. The recorded information has been reviewed and considered.  Jack Irwin, M.D.      Mylinda Latina III, MD 08/19/11 [phone]

## 2011-08-22 ENCOUNTER — Encounter (HOSPITAL_COMMUNITY): Payer: Self-pay | Admitting: *Deleted

## 2011-08-22 ENCOUNTER — Emergency Department (HOSPITAL_COMMUNITY)
Admission: EM | Admit: 2011-08-22 | Discharge: 2011-08-22 | Disposition: A | Discharge status: Home / Self Care | Payer: Medicare Other | Attending: Emergency Medicine | Admitting: Emergency Medicine

## 2011-08-22 DIAGNOSIS — E119 Type 2 diabetes mellitus without complications: Secondary | ICD-10-CM | POA: Insufficient documentation

## 2011-08-22 DIAGNOSIS — M109 Gout, unspecified: Secondary | ICD-10-CM | POA: Insufficient documentation

## 2011-08-22 DIAGNOSIS — G8929 Other chronic pain: Secondary | ICD-10-CM | POA: Insufficient documentation

## 2011-08-22 DIAGNOSIS — N498 Inflammatory disorders of other specified male genital organs: Secondary | ICD-10-CM | POA: Insufficient documentation

## 2011-08-22 DIAGNOSIS — F411 Generalized anxiety disorder: Secondary | ICD-10-CM | POA: Insufficient documentation

## 2011-08-22 DIAGNOSIS — N184 Chronic kidney disease, stage 4 (severe): Secondary | ICD-10-CM | POA: Insufficient documentation

## 2011-08-22 DIAGNOSIS — L0291 Cutaneous abscess, unspecified: Secondary | ICD-10-CM

## 2011-08-22 DIAGNOSIS — Z86718 Personal history of other venous thrombosis and embolism: Secondary | ICD-10-CM | POA: Insufficient documentation

## 2011-08-22 DIAGNOSIS — I129 Hypertensive chronic kidney disease with stage 1 through stage 4 chronic kidney disease, or unspecified chronic kidney disease: Secondary | ICD-10-CM | POA: Insufficient documentation

## 2011-08-22 LAB — CULTURE, ROUTINE-ABSCESS: Culture: NO GROWTH

## 2011-08-22 NOTE — ED Notes (Signed)
Refused dressing

## 2011-08-22 NOTE — ED Provider Notes (Signed)
History     CSN: [id number]  Arrival date & time 08/22/11  1956   First MD Initiated Contact with Patient 08/22/11 2033      Chief Complaint  Patient presents with  . Wound Check    (Consider location/radiation/quality/duration/timing/severity/associated sxs/prior treatment) HPI Comments: Patient returns to ED today requesting reevaluation of a previous incision and drainage of an abscess of his scrotum two days ago.  States the pain is improving and he is currently taking antibiotics.  He is unsure if the packing is still present.  He denies fever, swelling , difficulty urinating or redness  The history is provided by the patient.    Past Medical History  Diagnosis Date  . Hypertension   . Diabetes mellitus   . Gout   . Chronic back pain   . BPH (benign prostatic hyperplasia)   . Anemia   . DVT (deep venous thrombosis)   . Neuropathy   . Decubitus ulcer     of 2nd toes of both feet.  . Edema leg   . Constipation   . Venous (peripheral) insufficiency   . Neuropathy, diabetic   . Physical deconditioning   . Renal insufficiency   . Chronic kidney disease   . Chronic kidney disease (CKD), stage IV (severe)     Past Surgical History  Procedure Date  . None     Family History  Problem Relation Age of Onset  . Diabetes Mother   . Hypertension Mother   . Heart failure Mother   . Hyperlipidemia Mother   . Cancer Father   . Diabetes Father   . Hypertension Father   . Hyperlipidemia Father     History  Substance Use Topics  . Smoking status: Never Smoker   . Smokeless tobacco: Never Used  . Alcohol Use: No      Review of Systems  Constitutional: Negative for fever and chills.  Gastrointestinal: Negative for nausea and vomiting.  Musculoskeletal: Negative for joint swelling and arthralgias.  Skin: Positive for color change.       Abscess   Hematological: Negative for adenopathy.  All other systems reviewed and are negative.    Allergies  Review of  patient's allergies indicates no known allergies.  Home Medications   Current Outpatient Rx  Name Route Sig Dispense Refill  . ALLOPURINOL 300 MG PO TABS Oral Take 300 mg by mouth daily.    [name] AMITRIPTYLINE HCL 25 MG PO TABS Oral Take 25 mg by mouth at bedtime.    [name] CALCIUM ACETATE 667 MG PO CAPS Oral Take 1,334 mg by mouth 3 (three) times daily.     [name] VITAMIN D 1000 UNITS PO TABS Oral Take 1,000 Units by mouth daily.    . FEBUXOSTAT 80 MG PO TABS Oral Take 1 tablet by mouth daily.    . FUROSEMIDE 40 MG PO TABS Oral Take 40 mg by mouth daily.    [name] HYDROCODONE-ACETAMINOPHEN 5-325 MG PO TABS Oral Take 1 tablet by mouth every 6 (six) hours as needed.    [name] METOPROLOL TARTRATE 50 MG PO TABS Oral Take 50 mg by mouth 2 (two) times daily.      . OXYCODONE-ACETAMINOPHEN 5-325 MG PO TABS Oral Take 1 tablet by mouth every 4 (four) hours as needed for pain. 20 tablet 0  . PREDNISONE 10 MG PO TABS Oral Take 10 mg by mouth daily.    . SULFAMETHOXAZOLE-TRIMETHOPRIM 800-160 MG PO TABS Oral Take 1 tablet by mouth every 12 (twelve)  hours. 10 tablet 0  . VARDENAFIL HCL 20 MG PO TABS Oral Take 20 mg by mouth daily as needed. For sexual intercourse      BP 139/97  Pulse 94  Temp 98 F (36.7 C) (Oral)  Resp 20  Ht 5\' 11"  (1.803 m)  Wt 300 lb (136.079 kg)  BMI 41.84 kg/m2  SpO2 98%  Physical Exam  Nursing note and vitals reviewed. Constitutional: He is oriented to person, place, and time. He appears well-developed and well-nourished. No distress.  HENT:  Head: Normocephalic and atraumatic.  Cardiovascular: Normal rate, regular rhythm and normal heart sounds.   Pulmonary/Chest: Effort normal and breath sounds normal.  Genitourinary:       See skin exam  Neurological: He is alert and oriented to person, place, and time. He exhibits normal muscle tone. Coordination normal.  Skin: Skin is warm. There is erythema.       Abscess to the right scrotum with previous I&D.  Appears to be healing.  No packing  present.  No erythema or drainage    ED Course  Procedures (including critical care time)  Labs Reviewed - No data to display      MDM    Previous ED chart was reviewed   Vital signs are stable. Patient is nontoxic appearing. Site of previous I&D to the right scrotum appears to be healing. No erythema or fluctuance. Packing has been removed prior to arrival.   Patient is currently taking Bactrim DS .  Advised him to finish medication as directed and warm water soaks twice daily  The patient appears reasonably screened and/or stabilized for discharge and I doubt any other medical condition or other [facility] requiring further screening, evaluation, or treatment in the ED at this time prior to discharge.   Perlita Forbush L. [place], [place] 08/24/11 1239

## 2011-08-22 NOTE — ED Notes (Signed)
Here for recheck of abscess and removal of packing

## 2011-08-25 NOTE — ED Provider Notes (Signed)
Medical screening examination/treatment/procedure(s) were performed by non-physician practitioner and as supervising physician I was immediately available for consultation/collaboration.  Richarda Blade, MD 08/25/11 [phone]

## 2011-08-26 ENCOUNTER — Ambulatory Visit (HOSPITAL_COMMUNITY)
Admission: RE | Admit: 2011-08-26 | Discharge: 2011-08-26 | Disposition: A | Discharge status: Home / Self Care | Payer: Medicare Other | Source: Ambulatory Visit | Attending: Internal Medicine | Admitting: Internal Medicine

## 2011-08-26 NOTE — Evaluation (Signed)
Occupational Therapy Evaluation  Patient Details  Name: Jack Irwin MRN: [id number] Date of Birth: [birth year]  Today's Date: 08/26/2011 Time: 1030-1100 OT Time Calculation (min): 30 min  Evaluation: 1030-1100 Visit#: 1  of 12   Re-eval: 09/23/11  Assessment Diagnosis: gouty arthritis of bilateral hands Prior Therapy: OT 2x in 2011 for similar symptoms  Authorization: Medicare UEFI administered  Authorization Time Period: By visit #10  Authorization Visit#: 1  of 10    Past Medical History:  Past Medical History  Diagnosis Date  . Hypertension   . Diabetes mellitus   . Gout   . Chronic back pain   . BPH (benign prostatic hyperplasia)   . Anemia   . DVT (deep venous thrombosis)   . Neuropathy   . Decubitus ulcer     of 2nd toes of both feet.  . Edema leg   . Constipation   . Venous (peripheral) insufficiency   . Neuropathy, diabetic   . Physical deconditioning   . Renal insufficiency   . Chronic kidney disease   . Chronic kidney disease (CKD), stage IV (severe)    Past Surgical History:  Past Surgical History  Procedure Date  . None     Subjective S: My hands just dont work well. They hurt and I cant work. Pertinent History: Jack Irwin has experienced gouty arthritis in bilateral hands and feet for multiple years. The gout is affecting his P/AROM and strength of bilateral wrists and hands. This limits his ability to perform B/IADL tasks. He has been referred to occupational therapy for evaluation and treatment. Pain Assessment Currently in Pain?: Yes Pain Score:   3 Pain Location: Hand (bilateral hands, aching from wrist to fingertips) Pain Orientation: Right;Left;Anterior;Posterior Pain Type: Acute pain Pain Radiating Towards: Pain starts in wrist and radiates into fingers, on anterior and posterior surface of wrist/hand. 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 He always requires assist to tye his shoelaces.. Dominant Hand: Right Vision - History Baseline Vision: No visual deficits  Cognition/Observation Cognition Overall Cognitive Status: Appears within functional limits for tasks assessed Observation/Other Assessments Observations: UEFI: 26/80 (33%)   Sensation/Coordination/Edema Sensation Light Touch: Appears Intact Stereognosis: Not tested Hot/Cold: Not tested Proprioception: Not tested Coordination Gross Motor Movements are Fluid and Coordinated: Yes Fine Motor Movements are Fluid and Coordinated: Yes 9 Hole Peg Test: L 50 seconds, R 27 seconds  Additional Assessments RUE AROM (degrees) Right Wrist Extension: 50 Degrees Right Wrist Flexion: 60 Degrees Right Composite Finger Flexion: 50% RUE Strength Grip (lbs): 20 Lateral Pinch: 13 lbs 3 Point Pinch: 9 lbs LUE AROM (degrees) Left Wrist Extension: 50 Degrees Left Wrist Flexion: 70 Degrees Left Composite Finger Flexion: 75% LUE Strength Grip (lbs): 20 Lateral Pinch: 11 lbs 3 Point Pinch:  0 lbs (unable to maintain position on instrument) Right Hand Strength -  Pinch (lbs) Lateral Pinch: 13 lbs 3 Point Pinch: 9 lbs Left Hand Strength - Pinch (lbs) Lateral Pinch: 11 lbs 3 Point Pinch: 0 lbs (unable to maintain position on instrument)      Occupational Therapy Assessment and Plan OT Assessment and Plan Clinical Impression Statement: A: 46 y/o male with gout and arthritis of BUE presents with decreased AROM, strength and fine motor coordination of bilateral wrists and hands affecting his ability to peform B/IADL tasks independently. Pt will benefit from skilled therapeutic intervention in order to improve on the following deficits: Impaired UE functional use;Pain;Decreased activity tolerance;Decreased range of motion;Decreased strength Rehab Potential: Good OT Frequency: Min 2X/week OT Duration: 6 weeks OT Plan: P: Skilled OT TX 2x per week to address grip/pinch strength and fine motor coordination of bilateral hands for increased independence with B/IADL tasks. TX Plan: AROM of wrist and digits, grip and pinch strengthening with theraputty or handgripper, fine motor coordination training with peg board, sponges, etc.   Goals Short Term Goals Time to Complete Short Term Goals: 3 weeks Short Term Goal 1: Patient will be educated on HEP. Short Term Goal 2: Patient will complete 9-hole peg test in 40 seconds or less with L hand for increased independence with tying shoe laces. Short Term Goal 3: Patient will improve grip and pinch strength by 25% for increased independence with donning and doffing clothing.  Short Term Goal 4: Patient will complete BADL tasks with min assist. Long Term Goals Long Term Goal 1: Patient will return to PLOF with B/IADL activities. Long Term Goal 2: Patient will complete 9 hole peg test in less than 25 seconds with R hand and less than 30 seconds with L hand in order to increase independence with tying shoelaces. Long Term Goal 3: Patient will improve grip and pinch strength by 50% for increased ability to hold a  cup. Long Term Goal 4: Patient will complete BADL tasks independently.  Problem List Patient Active Problem List  Diagnosis  . Difficulty in walking  . Weakness of both legs  . Poor Balance    End of Session Activity Tolerance: Patient tolerated treatment well General Behavior During Session: [facility] for tasks performed Cognition: [facility] for tasks performed OT Plan of Care OT Home Exercise Plan: Hand/wrist AROM. Will add theraputty once introduced in therapy OT Patient Instructions: 1-2x per day Consulted and Agree with Plan of Care: Patient  GO Functional Assessment Tool Used: UEFI Functional Limitation: Self care Self Care Current Status [id number]): At least 60 percent but less than 80 percent impaired, limited or restricted Self Care Goal Status [id number]): At least 1 percent but less than 20 percent impaired, limited or restricted  Molli Knock, OTS Direct line of sight observation completed throughout treatment session. Note reviewed by clinical instructor and accurately reflects treatment session.  Vangie Bicker, OTR/L   08/26/2011, 3:36 PM  Physician Documentation Your signature is required to indicate approval of the treatment plan as stated above.  Please sign and either send electronically or make a copy of this report for your files and return this physician signed original.  Please mark one 1.__approve of plan  2. ___approve of plan with the following conditions.   ______________________________  _____________________ Physician Signature                                                                                                             Date

## 2011-08-26 NOTE — Progress Notes (Signed)
Physical Therapy Treatment Patient Details  Name: Jack Irwin MRN: [id number] Date of Birth: 03-19-1965  Today's Date: 08/26/2011 Time: 1100-1148 PT Time Calculation (min): 48 min  Visit#: 2  of 8   Re-eval: 09/17/11 Charges: Gait x 8' Therex x 30'  Authorization: Medicare  Authorization Visit#: 2  of 10    Subjective: Symptoms/Limitations Symptoms: I want to be able to walk for a longer time without getting tired. Pain Assessment Currently in Pain?: No/denies   Exercise/Treatments Aerobic Stationary Bike: 6'@3 .0 Tread Mill: 5' all together with 3 rest breaks .25 cycle/sec with cueing for posture and gait mechanics Standing Heel Raises: 10 reps;Limitations Heel Raises Limitations: Toe raises 10 reps Functional Squat:  (Attempted but palpable popping in knees noted) Other Standing Knee Exercises: Side stepping 1 RT Seated Other Seated Knee Exercises: STS w/o UUE x 5  Physical Therapy Assessment and Plan PT Assessment and Plan Clinical Impression Statement: Pt requires frequent rest breaks throughout session secondary to fatigue. Pt requires cueing for proper gait mechanics (posture, stride length, knee and hip flexion, and heel-toe pattern). Pt states that he feels better at end of session that when he came into therapy. PT Plan: Continue per PT POC. Begin tandem gait, retro gait,  leg press, cybex ham and quad strengthening.     Problem List Patient Active Problem List  Diagnosis  . Difficulty in walking  . Weakness of both legs  . Poor Balance    PT - End of Session Equipment Utilized During Treatment: Gait belt Activity Tolerance: Patient tolerated treatment well General Behavior During Session: [facility] for tasks performed Cognition: [facility] for tasks performed   Rachelle Hora, PTA 08/26/2011, 11:52 AM

## 2011-08-28 ENCOUNTER — Ambulatory Visit (HOSPITAL_COMMUNITY): Payer: Medicare Other | Admitting: Physical Therapy

## 2011-09-01 ENCOUNTER — Ambulatory Visit (HOSPITAL_COMMUNITY): Payer: Medicare Other | Admitting: Physical Therapy

## 2011-09-02 ENCOUNTER — Ambulatory Visit (HOSPITAL_COMMUNITY): Payer: Medicare Other | Admitting: Physical Therapy

## 2011-09-04 ENCOUNTER — Ambulatory Visit (HOSPITAL_COMMUNITY): Payer: Medicare Other | Admitting: *Deleted

## 2011-09-04 ENCOUNTER — Ambulatory Visit (HOSPITAL_COMMUNITY): Payer: Medicare Other | Admitting: Occupational Therapy

## 2011-09-06 ENCOUNTER — Encounter (HOSPITAL_COMMUNITY): Payer: Self-pay

## 2011-09-06 ENCOUNTER — Emergency Department (HOSPITAL_COMMUNITY): Payer: Medicare Other

## 2011-09-06 ENCOUNTER — Emergency Department (HOSPITAL_COMMUNITY)
Admission: EM | Admit: 2011-09-06 | Discharge: 2011-09-06 | Disposition: A | Discharge status: Home / Self Care | Payer: Medicare Other | Attending: Emergency Medicine | Admitting: Emergency Medicine

## 2011-09-06 DIAGNOSIS — N400 Enlarged prostate without lower urinary tract symptoms: Secondary | ICD-10-CM | POA: Insufficient documentation

## 2011-09-06 DIAGNOSIS — L089 Local infection of the skin and subcutaneous tissue, unspecified: Secondary | ICD-10-CM

## 2011-09-06 DIAGNOSIS — Z86718 Personal history of other venous thrombosis and embolism: Secondary | ICD-10-CM | POA: Insufficient documentation

## 2011-09-06 DIAGNOSIS — B351 Tinea unguium: Secondary | ICD-10-CM

## 2011-09-06 DIAGNOSIS — M109 Gout, unspecified: Secondary | ICD-10-CM | POA: Insufficient documentation

## 2011-09-06 DIAGNOSIS — E1142 Type 2 diabetes mellitus with diabetic polyneuropathy: Secondary | ICD-10-CM | POA: Insufficient documentation

## 2011-09-06 DIAGNOSIS — I129 Hypertensive chronic kidney disease with stage 1 through stage 4 chronic kidney disease, or unspecified chronic kidney disease: Secondary | ICD-10-CM | POA: Insufficient documentation

## 2011-09-06 DIAGNOSIS — E1149 Type 2 diabetes mellitus with other diabetic neurological complication: Secondary | ICD-10-CM | POA: Insufficient documentation

## 2011-09-06 DIAGNOSIS — N184 Chronic kidney disease, stage 4 (severe): Secondary | ICD-10-CM | POA: Insufficient documentation

## 2011-09-06 LAB — BASIC METABOLIC PANEL
BUN: 61 mg/dL — ABNORMAL HIGH (ref 6–23)
Calcium: 9.6 mg/dL (ref 8.4–10.5)
Creatinine, Ser: 3.49 mg/dL — ABNORMAL HIGH (ref 0.50–1.35)
GFR calc Af Amer: 23 mL/min — ABNORMAL LOW (ref >90)
GFR calc non Af Amer: 20 mL/min — ABNORMAL LOW (ref >90)
Glucose, Bld: 98 mg/dL (ref 70–99)
Potassium: 4.2 mEq/L (ref 3.5–5.1)

## 2011-09-06 MED ORDER — SULFAMETHOXAZOLE-TRIMETHOPRIM 800-160 MG PO TABS: 1.0000 | ORAL_TABLET | Freq: Two times a day (BID) | ORAL | Status: AC

## 2011-09-06 MED ORDER — HYDROCODONE-ACETAMINOPHEN 5-325 MG PO TABS
1.0000 | ORAL_TABLET | Freq: Once | ORAL | Status: AC
  Administered 2011-09-06: 1 via ORAL
  Filled 2011-09-06: qty 1

## 2011-09-06 MED ORDER — SULFAMETHOXAZOLE-TMP DS 800-160 MG PO TABS
1.0000 | ORAL_TABLET | Freq: Once | ORAL | Status: AC
  Administered 2011-09-06: 1 via ORAL
  Filled 2011-09-06: qty 1

## 2011-09-06 NOTE — ED Provider Notes (Signed)
History     CSN: [id number]  Arrival date & time 09/06/11  1253   First MD Initiated Contact with Patient 09/06/11 1300      Chief Complaint  Patient presents with  . Foot Pain    (Consider location/radiation/quality/duration/timing/severity/associated sxs/prior treatment) HPI Comments: Jack Irwin presents with increased swelling in his bilateral feet for the past 2 days.  He denies cough,  Shortness of breath and chest pain.  He has increased pain in his right great toe along with increased swelling and redness.  He denies fever,  Chills, and has not had any trauma.  Past medical history is significant for diet controlled DM,  Severe renal insufficiency and gout, along with rheumatoid arthritis.  He is currently taking prednisone prescribed by his rheumatologist Dr Jolee Ewing.    The history is provided by the patient.    Past Medical History  Diagnosis Date  . Hypertension   . Diabetes mellitus   . Gout   . Chronic back pain   . BPH (benign prostatic hyperplasia)   . Anemia   . DVT (deep venous thrombosis)   . Neuropathy   . Decubitus ulcer     of 2nd toes of both feet.  . Edema leg   . Constipation   . Venous (peripheral) insufficiency   . Neuropathy, diabetic   . Physical deconditioning   . Renal insufficiency   . Chronic kidney disease   . Chronic kidney disease (CKD), stage IV (severe)     Past Surgical History  Procedure Date  . None     Family History  Problem Relation Age of Onset  . Diabetes Mother   . Hypertension Mother   . Heart failure Mother   . Hyperlipidemia Mother   . Cancer Father   . Diabetes Father   . Hypertension Father   . Hyperlipidemia Father     History  Substance Use Topics  . Smoking status: Never Smoker   . Smokeless tobacco: Never Used  . Alcohol Use: No      Review of Systems  Musculoskeletal: Positive for joint swelling and arthralgias.  Skin: Positive for color change. Negative for wound.  Neurological:  Negative for weakness and numbness.    Allergies  Review of patient's allergies indicates no known allergies.  Home Medications   Current Outpatient Rx  Name Route Sig Dispense Refill  . ALLOPURINOL 300 MG PO TABS Oral Take 300 mg by mouth daily.    [name] AMITRIPTYLINE HCL 25 MG PO TABS Oral Take 25 mg by mouth at bedtime.    [name] CALCIUM ACETATE 667 MG PO CAPS Oral Take 667 mg by mouth 3 (three) times daily with meals.    [name] VITAMIN D 1000 UNITS PO TABS Oral Take 1,000 Units by mouth daily.    . FEBUXOSTAT 80 MG PO TABS Oral Take 1 tablet by mouth daily.    [name] METOPROLOL TARTRATE 50 MG PO TABS Oral Take 50 mg by mouth daily.     . OXYCODONE-ACETAMINOPHEN 5-325 MG PO TABS Oral Take 1 tablet by mouth daily as needed. pain    . PREDNISONE 10 MG PO TABS Oral Take 10 mg by mouth daily as needed. For arthritis and swelling    . VARDENAFIL HCL 20 MG PO TABS Oral Take 20 mg by mouth daily as needed. For sexual intercourse    . SULFAMETHOXAZOLE-TRIMETHOPRIM 800-160 MG PO TABS Oral Take 1 tablet by mouth 2 (two) times daily. 20 tablet 0  BP 140/87  Pulse 81  Temp 98 F (36.7 C) (Oral)  Resp 16  SpO2 100%  Physical Exam  Constitutional: He appears well-developed and well-nourished. No distress.  HENT:  Head: Normocephalic.  Neck: Neck supple.  Cardiovascular: Normal rate.   Pulmonary/Chest: Effort normal. He has no wheezes.  Musculoskeletal: Normal range of motion. He exhibits no edema.  Skin: Skin is warm. There is erythema.       Increased pain right great toe with increased warmth and edema,  Erythema.  Advanced onychomycosis of right great toenail.  No drainage, there is a large callous formation at right lateral lateral toe.  Pedal pulses intact,  Trace bilateral edema to foot dorsum.      ED Course  Procedures (including critical care time)  Labs Reviewed  BASIC METABOLIC PANEL - Abnormal; Notable for the following:    BUN 61 (*)     Creatinine, Ser 3.49 (*)     GFR calc non Af  Amer 20 (*)     GFR calc Af Amer 23 (*)     All other components within normal limits   No results found.   1. Toe infection   2. Onychomycosis of toenail       MDM  xrays and labs reviewed prior to dc home.  Toe infection vs possible gouty flare,  Although pt denies prior h/o gout affecting toes or lower extremities, generally causing sx in hands and upper extremities.  Chronic renal insufficiency which is stable today.  Pt started on bactrim,  First dose given here.  Encouraged to continue with prednisone and allopurinol.  F/u by pcp this week.        Evalee Jefferson, [place] 09/06/11 1513

## 2011-09-06 NOTE — ED Notes (Signed)
Pt states he is out of his pain med that usually works.

## 2011-09-06 NOTE — ED Notes (Signed)
Discharge instructions reviewed with pt, questions answered. Pt verbalized understanding.  

## 2011-09-06 NOTE — ED Notes (Signed)
Pt reports pain and swelling to both of his feet.  Pt reports that the swelling has been going on for a few days.  Pedal pulses present.

## 2011-09-07 ENCOUNTER — Emergency Department (HOSPITAL_COMMUNITY)
Admission: EM | Admit: 2011-09-07 | Discharge: 2011-09-07 | Disposition: A | Discharge status: Home / Self Care | Payer: Medicare Other | Attending: Emergency Medicine | Admitting: Emergency Medicine

## 2011-09-07 ENCOUNTER — Encounter (HOSPITAL_COMMUNITY): Payer: Self-pay | Admitting: Emergency Medicine

## 2011-09-07 DIAGNOSIS — I120 Hypertensive chronic kidney disease with stage 5 chronic kidney disease or end stage renal disease: Secondary | ICD-10-CM | POA: Insufficient documentation

## 2011-09-07 DIAGNOSIS — N400 Enlarged prostate without lower urinary tract symptoms: Secondary | ICD-10-CM | POA: Insufficient documentation

## 2011-09-07 DIAGNOSIS — L03039 Cellulitis of unspecified toe: Secondary | ICD-10-CM | POA: Insufficient documentation

## 2011-09-07 DIAGNOSIS — N186 End stage renal disease: Secondary | ICD-10-CM | POA: Insufficient documentation

## 2011-09-07 DIAGNOSIS — Z86718 Personal history of other venous thrombosis and embolism: Secondary | ICD-10-CM | POA: Insufficient documentation

## 2011-09-07 DIAGNOSIS — L02619 Cutaneous abscess of unspecified foot: Secondary | ICD-10-CM | POA: Insufficient documentation

## 2011-09-07 DIAGNOSIS — Z79899 Other long term (current) drug therapy: Secondary | ICD-10-CM | POA: Insufficient documentation

## 2011-09-07 DIAGNOSIS — H109 Unspecified conjunctivitis: Secondary | ICD-10-CM

## 2011-09-07 DIAGNOSIS — E1142 Type 2 diabetes mellitus with diabetic polyneuropathy: Secondary | ICD-10-CM | POA: Insufficient documentation

## 2011-09-07 DIAGNOSIS — L0390 Cellulitis, unspecified: Secondary | ICD-10-CM

## 2011-09-07 DIAGNOSIS — IMO0002 Reserved for concepts with insufficient information to code with codable children: Secondary | ICD-10-CM | POA: Insufficient documentation

## 2011-09-07 DIAGNOSIS — E1149 Type 2 diabetes mellitus with other diabetic neurological complication: Secondary | ICD-10-CM | POA: Insufficient documentation

## 2011-09-07 MED ORDER — CEPHALEXIN 500 MG PO CAPS
500.0000 mg | ORAL_CAPSULE | Freq: Once | ORAL | Status: AC
  Administered 2011-09-07: 500 mg via ORAL
  Filled 2011-09-07: qty 1

## 2011-09-07 MED ORDER — BACITRACIN 500 UNIT/GM EX OINT
1.0000 "application " | TOPICAL_OINTMENT | Freq: Two times a day (BID) | CUTANEOUS | Status: DC
  Administered 2011-09-07: 1 via TOPICAL
  Filled 2011-09-07 (×5): qty 0.9

## 2011-09-07 MED ORDER — TOBRAMYCIN 0.3 % OP SOLN
2.0000 [drp] | Freq: Once | OPHTHALMIC | Status: AC
  Administered 2011-09-07: 2 [drp] via OPHTHALMIC
  Filled 2011-09-07: qty 5

## 2011-09-07 NOTE — ED Provider Notes (Signed)
Medical screening examination/treatment/procedure(s) were conducted as a shared visit with non-physician practitioner(s) and myself.  I personally evaluated the patient during the encounter  Pt well appearing, given his exam, I doubt septic arthritis  Sharyon Cable, MD 09/07/11 [id number]

## 2011-09-07 NOTE — ED Notes (Signed)
rx called in to [facility] Keflex 500 mg #40

## 2011-09-07 NOTE — ED Notes (Signed)
Here yesterday for r great toe pain. States back today due to area bleeding. Nad.

## 2011-09-07 NOTE — ED Notes (Signed)
Pt has open draining wound to his right great toe with small amount yellowish red drainage. Pt was seen yesterday and placed on antibiotics and called this am and told to change them. Prescription called in to pharmacy. Pt states that he could not get them filled so he has not had any today. Pt also c/o burning and itching to his left eye. Bilateral eyes slightly red and draining small amount.

## 2011-09-09 ENCOUNTER — Inpatient Hospital Stay (HOSPITAL_COMMUNITY): Admission: RE | Admit: 2011-09-09 | Payer: Medicare Other | Source: Ambulatory Visit | Admitting: Physical Therapy

## 2011-09-09 ENCOUNTER — Ambulatory Visit (HOSPITAL_COMMUNITY): Payer: Medicare Other | Admitting: Occupational Therapy

## 2011-09-10 NOTE — ED Provider Notes (Signed)
History     CSN: [id number]  Arrival date & time 09/07/11  1825   First MD Initiated Contact with Patient 09/07/11 1857      Chief Complaint  Patient presents with  . Toe Pain    (Consider location/radiation/quality/duration/timing/severity/associated sxs/prior treatment) HPI Comments: Patient returns to ED requesting re-evaluation of wound to his right great toe.  Patient was seen here o the day prior to this visit and given rx for keflex for infection of the skin of the toe.  States he has not been able to get the medication filled yet and he developed bleeding of the toe and did not have dressings at home to bandage the toe.  He denies worsening symptoms.  Pt also c/o bilateral eye drainage and crusting.  He denies contact use, fever, headaches or visual changes.    Patient is a 46 y.o. male presenting with toe pain. The history is provided by the patient.  Toe Pain This is a new problem. The current episode started yesterday. The problem occurs constantly. The problem has been unchanged. Associated symptoms include arthralgias. Pertinent negatives include no chills, fever, joint swelling, numbness, rash, vomiting or weakness. The symptoms are aggravated by walking and standing (palpation). He has tried nothing for the symptoms. The treatment provided no relief.    Past Medical History  Diagnosis Date  . Hypertension   . Diabetes mellitus   . Gout   . Chronic back pain   . BPH (benign prostatic hyperplasia)   . Anemia   . DVT (deep venous thrombosis)   . Neuropathy   . Decubitus ulcer     of 2nd toes of both feet.  . Edema leg   . Constipation   . Venous (peripheral) insufficiency   . Neuropathy, diabetic   . Physical deconditioning   . Renal insufficiency   . Chronic kidney disease   . Chronic kidney disease (CKD), stage IV (severe)     Past Surgical History  Procedure Date  . None     Family History  Problem Relation Age of Onset  . Diabetes Mother   .  Hypertension Mother   . Heart failure Mother   . Hyperlipidemia Mother   . Cancer Father   . Diabetes Father   . Hypertension Father   . Hyperlipidemia Father     History  Substance Use Topics  . Smoking status: Never Smoker   . Smokeless tobacco: Never Used  . Alcohol Use: No      Review of Systems  Constitutional: Negative for fever and chills.  Eyes: Positive for discharge and itching. Negative for visual disturbance.  Gastrointestinal: Negative for vomiting.  Genitourinary: Negative for dysuria and difficulty urinating.  Musculoskeletal: Positive for arthralgias. Negative for joint swelling.  Skin: Positive for wound. Negative for color change and rash.       Bleeding of a wound to the toe  Neurological: Negative for weakness and numbness.  All other systems reviewed and are negative.    Allergies  Review of patient's allergies indicates no known allergies.  Home Medications   Current Outpatient Rx  Name Route Sig Dispense Refill  . ALLOPURINOL 300 MG PO TABS Oral Take 300 mg by mouth daily.    [name] AMITRIPTYLINE HCL 25 MG PO TABS Oral Take 25 mg by mouth at bedtime.    [name] CALCIUM ACETATE 667 MG PO CAPS Oral Take 667 mg by mouth 3 (three) times daily with meals.    [name] VITAMIN D 1000 UNITS  PO TABS Oral Take 1,000 Units by mouth daily.    . FEBUXOSTAT 80 MG PO TABS Oral Take 1 tablet by mouth daily.    [name] METOPROLOL TARTRATE 50 MG PO TABS Oral Take 50 mg by mouth daily.     . OXYCODONE-ACETAMINOPHEN 5-325 MG PO TABS Oral Take 1 tablet by mouth daily as needed. pain    . PREDNISONE 10 MG PO TABS Oral Take 10 mg by mouth daily as needed. For arthritis and swelling    . SULFAMETHOXAZOLE-TRIMETHOPRIM 800-160 MG PO TABS Oral Take 1 tablet by mouth 2 (two) times daily. 20 tablet 0  . VARDENAFIL HCL 20 MG PO TABS Oral Take 20 mg by mouth daily as needed. For sexual intercourse      BP 149/105  Pulse 109  Temp 97.8 F (36.6 C) (Oral)  Resp 20  Ht 5\' 11"  (1.803 m)  Wt 300  lb (136.079 kg)  BMI 41.84 kg/m2  SpO2 99%  Physical Exam  Nursing note and vitals reviewed. Constitutional: He is oriented to person, place, and time. He appears well-developed and well-nourished. No distress.  HENT:  Head: Normocephalic and atraumatic.  Eyes: Pupils are equal, round, and reactive to light. Right eye exhibits discharge. Right eye exhibits no chemosis and no hordeolum. No foreign body present in the right eye. Left eye exhibits discharge. Left eye exhibits no chemosis and no hordeolum. No foreign body present in the left eye. Right conjunctiva is not injected. Left conjunctiva is not injected. Right eye exhibits normal extraocular motion and no nystagmus. Left eye exhibits normal extraocular motion and no nystagmus.  Cardiovascular: Normal rate, regular rhythm, normal heart sounds and intact distal pulses.   Pulmonary/Chest: Effort normal and breath sounds normal.  Musculoskeletal: He exhibits tenderness. He exhibits no edema.       Right foot: He exhibits tenderness. He exhibits normal range of motion, no bony tenderness, no swelling and normal capillary refill.       Feet:       Open, circular wound to the distal right great toe.  no surrounding erythema, edema or active bleeding.  ROM is preserved.  DP pulse is brisk, sensation intact.     Neurological: He is alert and oriented to person, place, and time. He exhibits normal muscle tone. Coordination normal.  Skin: Skin is warm and dry.    ED Course  Procedures (including critical care time)  Labs Reviewed  GLUCOSE, CAPILLARY - Abnormal; Notable for the following:    Glucose-Capillary 133 (*)     All other components within normal limits  LAB REPORT - SCANNED     1. Cellulitis   2. Conjunctivitis       MDM   Pt is well known to ED staff and myself.  Appears at his neurological baseline.  Non-toxic.  Wound was cleaned and bandaged by nursing staff.   The patient appears reasonably screened and/or stabilized  for discharge and I doubt any other medical condition or other [facility] requiring further screening, evaluation, or treatment in the ED at this time prior to discharge.   Dispensed tobramycin ophth soln in ED   Jayten Gabbard L. [place], [place] 09/10/11 1749

## 2011-09-10 NOTE — ED Provider Notes (Signed)
Medical screening examination/treatment/procedure(s) were performed by non-physician practitioner and as supervising physician I was immediately available for consultation/collaboration.   Hoy Morn, MD 09/10/11 2144

## 2011-09-11 ENCOUNTER — Ambulatory Visit (HOSPITAL_COMMUNITY): Payer: Medicare Other | Admitting: Specialist

## 2011-09-11 ENCOUNTER — Ambulatory Visit (HOSPITAL_COMMUNITY): Payer: Medicare Other | Admitting: Physical Therapy

## 2011-09-15 ENCOUNTER — Ambulatory Visit (HOSPITAL_COMMUNITY)
Admission: RE | Admit: 2011-09-15 | Discharge: 2011-09-15 | Disposition: A | Discharge status: Home / Self Care | Payer: Medicare Other | Source: Ambulatory Visit | Attending: Internal Medicine | Admitting: Internal Medicine

## 2011-09-15 DIAGNOSIS — M6281 Muscle weakness (generalized): Secondary | ICD-10-CM | POA: Insufficient documentation

## 2011-09-15 DIAGNOSIS — IMO0001 Reserved for inherently not codable concepts without codable children: Secondary | ICD-10-CM | POA: Insufficient documentation

## 2011-09-15 DIAGNOSIS — R269 Unspecified abnormalities of gait and mobility: Secondary | ICD-10-CM | POA: Insufficient documentation

## 2011-09-15 NOTE — Progress Notes (Signed)
Physical Therapy Treatment Patient Details  Name: Jack Irwin MRN: [id number] Date of Birth: 13-Aug-1965  Today's Date: 09/15/2011 Time: 1400-1435 PT Time Calculation (min): 35 min  Visit#: 3  of 8   Re-eval: 09/17/11 Charges: Therex x 28'  Authorization: Medicare  Authorization Visit#: 3  of 10    Subjective: Symptoms/Limitations Symptoms: I've been dizzy today. Pain Assessment Currently in Pain?: No/denies Pain Score:   6 Pain Location: Hand Pain Orientation: Right;Left Pain Type: Chronic pain   Exercise/Treatments Seated Long Arc Quad: 10 reps;Both;Weights Long Arc Quad Weight: 3 lbs. Other Seated Knee Exercises: Seated march sitting with correct posture x 10 Other Seated Knee Exercises: hip add iso with ball 10x5" Supine Bridges: 10 reps  Physical Therapy Assessment and Plan PT Assessment and Plan Clinical Impression Statement: Pt reports dizziness at entry. Blood pressure taken: 128/98. Pt reports that he is on medication for hypertension. Tx modified to bed ex secondary to hypertension. Pt is without complaint throughout rest of session. Pt advised to consult MD if symptoms persist. Pt requires frequent cueing with seated therex to improve posture. PT Plan: Continue per PT POC. Begin tandem gait, retro gait,  leg press, cybex ham and quad strengthening.     Problem List Patient Active Problem List  Diagnosis  . Difficulty in walking  . Weakness of both legs  . Poor Balance    PT - End of Session Equipment Utilized During Treatment: Gait belt Activity Tolerance: Patient tolerated treatment well General Behavior During Session: [facility] for tasks performed Cognition: [facility] for tasks performed  Rachelle Hora, PTA 09/15/2011, 3:01 PM

## 2011-09-15 NOTE — Progress Notes (Signed)
Occupational Therapy Treatment Patient Details  Name: Jack Irwin MRN: [id number] Date of Birth: [birth year]  Today's Date: 09/15/2011 Time: [id number] OT Time Calculation (min): 35 min Therapeutic Exercises 35' Visit#: 2  of 12   Re-eval: 09/23/11    Authorization: Medicare  Authorization Time Period: By visit #10   Authorization Visit#: 2  of 10   Subjective S:  I want to go back to work.  I have been exercising and using my hand. Pain Assessment Currently in Pain?: No/denies Pain Score:   6 Pain Location: Hand Pain Orientation: Right;Left Pain Type: Chronic pain  Precautions/Restrictions   N/A  Exercise/Treatments  Other Seated Exercises: seated ext, ret, row x 15 reps with red tband for scapular stability for increased [facility]   Elbow Exercises Elbow Flexion: Strengthening;10 reps Bar Weights/Barbell (Elbow Flexion): 1 lb Elbow Extension: Strengthening;10 reps Bar Weights/Barbell (Elbow Extension): 1 lb Forearm Supination: Strengthening;10 reps (1 pound) Forearm Pronation: Strengthening;10 reps (1 pound) Wrist Flexion: Strengthening;10 reps Bar Weights/Barbell (Wrist Flexion): 1 lb Wrist Extension: Strengthening;10 reps Bar Weights/Barbell (Wrist Extension): 1 lb   Hand Exercises Theraputty: Flatten;Roll;Grip Theraputty - Flatten: Bilateral yellow Theraputty - Roll: bilateral yellow Theraputty - Grip: bilateral yellow Theraputty - Locate Pegs: 5 pennies with each hand in yellow putty       Occupational Therapy Assessment and Plan OT Assessment and Plan Clinical Impression Statement: A:  Jack Irwin was able to manipulate pennies with both of his hands with only minimal difficulty this date. OT Plan: P:  Add theraputty attachments for work simulation of tightening wheelchair nuts and bolts.   Goals Short Term Goals Short Term Goal 1 Progress: Progressing toward goal Short Term Goal 2: Patient will complete 9-hole peg test in 40 seconds or less with L  hand for increased independence with tying shoe laces. Short Term Goal 2 Progress: Progressing toward goal Short Term Goal 3: Patient will improve grip and pinch strength by 25% for increased independence with donning and doffing clothing.  Short Term Goal 3 Progress: Progressing toward goal Short Term Goal 4 Progress: Progressing toward goal Long Term Goals Long Term Goal 1 Progress: Progressing toward goal Long Term Goal 2 Progress: Progressing toward goal Long Term Goal 3 Progress: Progressing toward goal Long Term Goal 4 Progress: Progressing toward goal  Problem List Patient Active Problem List  Diagnosis  . Difficulty in walking  . Weakness of both legs  . Poor Balance    End of Session Activity Tolerance: Patient tolerated treatment well General Behavior During Session: [facility] for tasks performed Cognition: [facility] for tasks performed  [place], OTR/L  09/15/2011, 3:08 PM

## 2011-09-17 IMAGING — CT CT ABD-PELV W/O CM
2 of 3 series · 17 of 46 positions shown, 19 images · non-contrast
Comparison: 11/03/2009 and earlier.

CLINICAL DATA: 44-year-old male with right-sided flank pain.

CT ABDOMEN AND PELVIS WITHOUT CONTRAST
TECHNIQUE: Multidetector CT imaging of the abdomen and pelvis was
performed following the standard protocol without intravenous
contrast.

[Series 2: standard/full over (age)lbs 5.0 · axial · 0.85mm/px · z∈[-533,-83]mm · 14 of 104 slices shown, 16 images]
[im 7/104  soft-tissue]
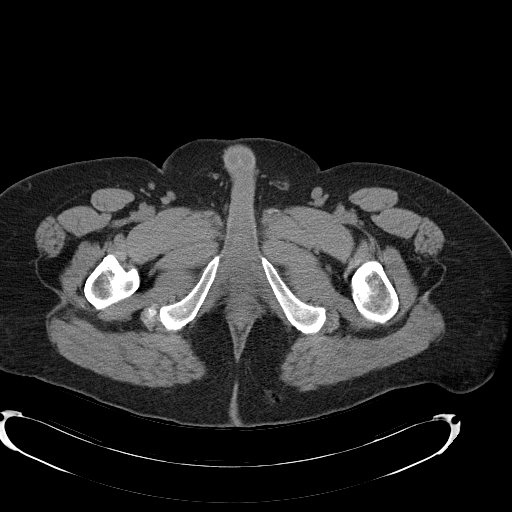
[im 7/104  bone]
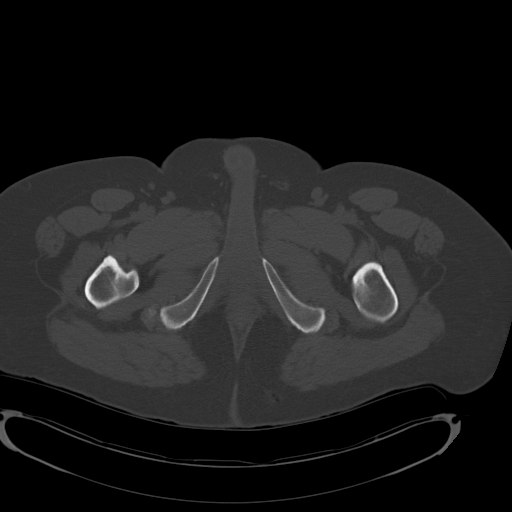
[im 14/104  soft-tissue]
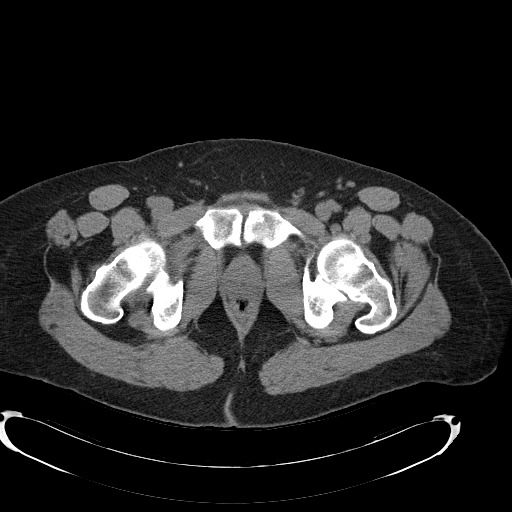
[im 20/104  soft-tissue]
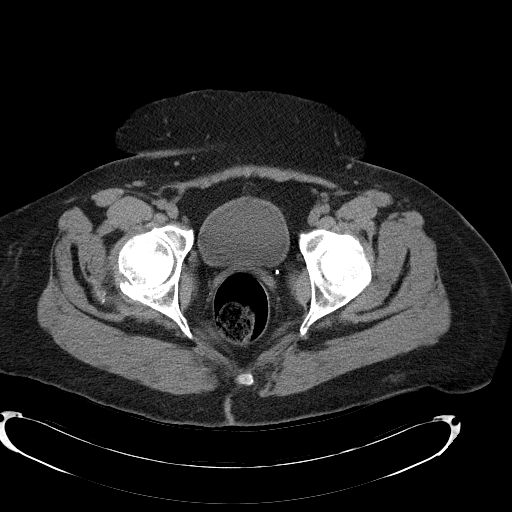
[im 27/104  soft-tissue]
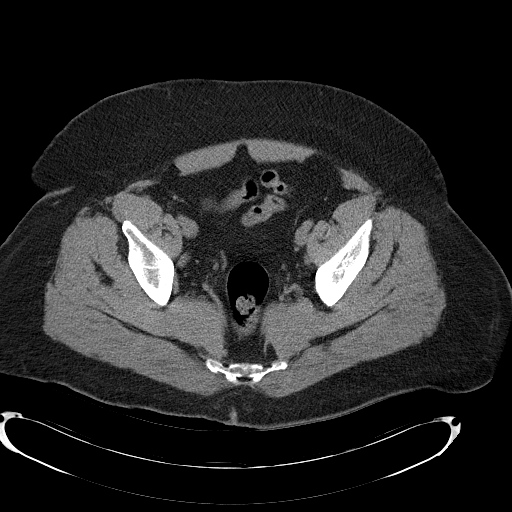
[im 34/104  soft-tissue]
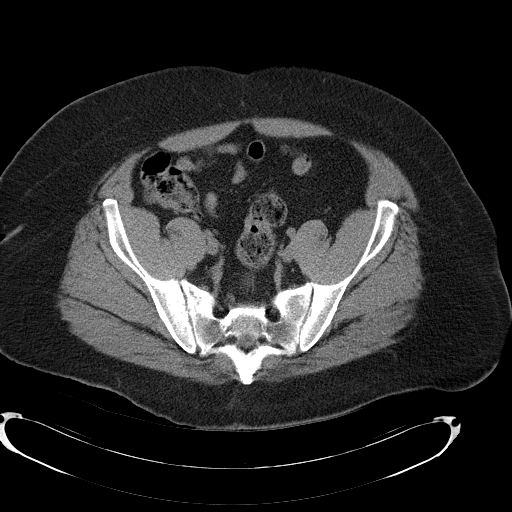
[im 40/104  soft-tissue]
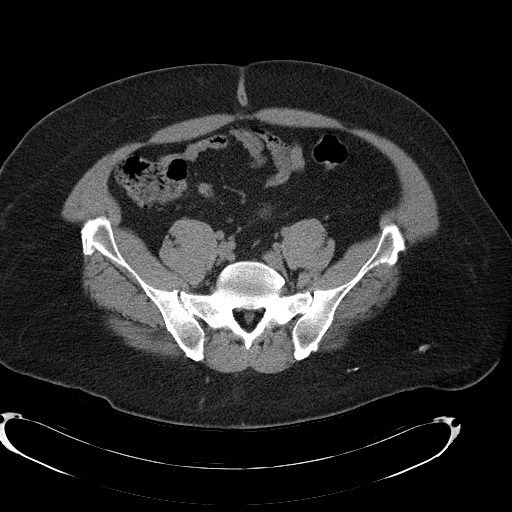
[im 47/104  soft-tissue]
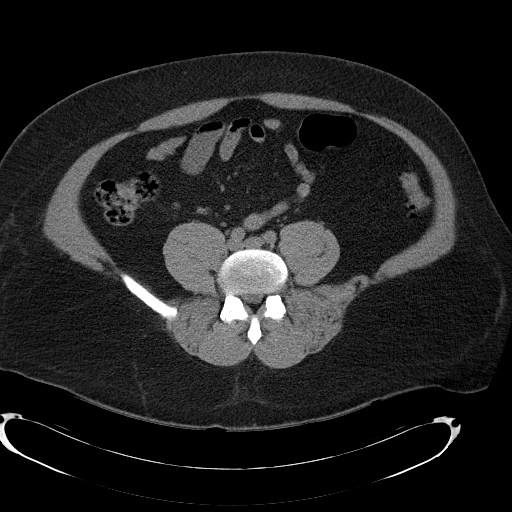
[im 57/104  soft-tissue]
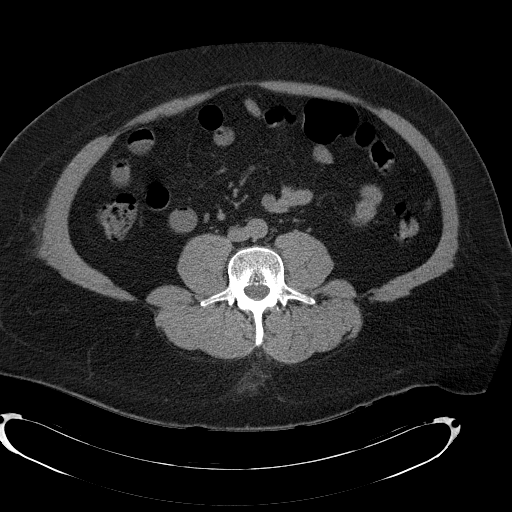
[im 64/104  soft-tissue]
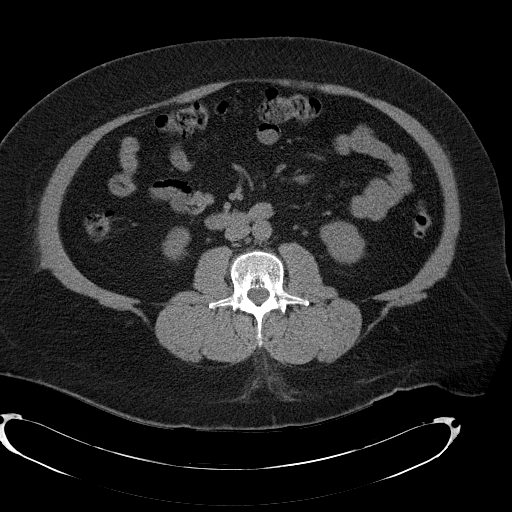
[im 64/104  bone]
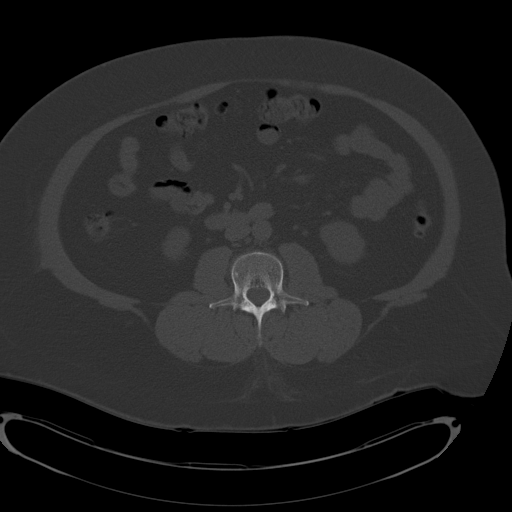
[im 70/104  soft-tissue]
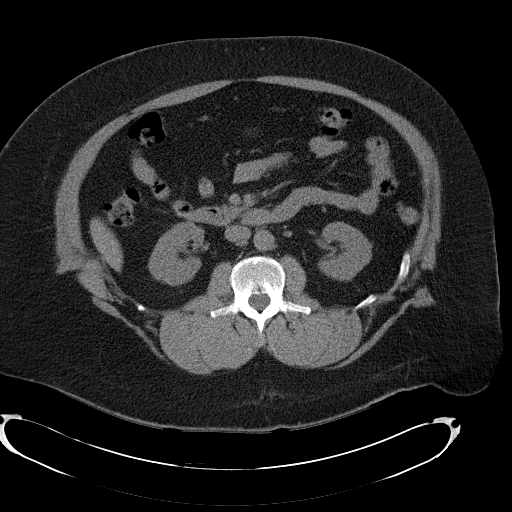
[im 77/104  soft-tissue]
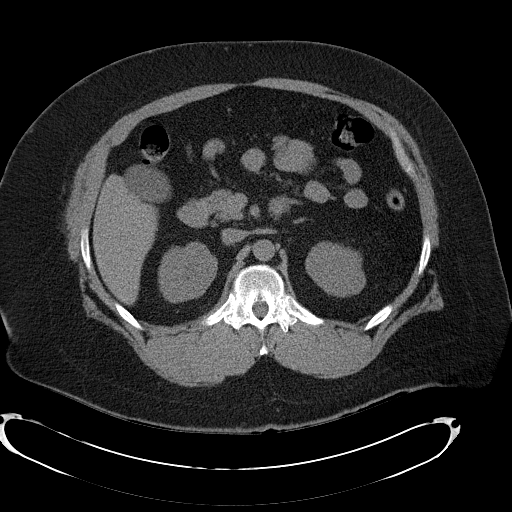
[im 84/104  soft-tissue]
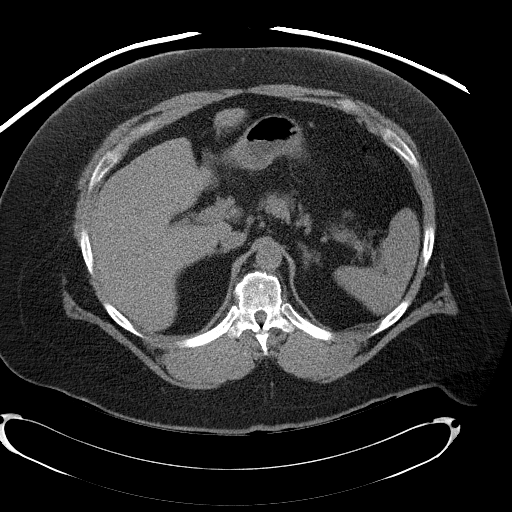
[im 90/104  soft-tissue]
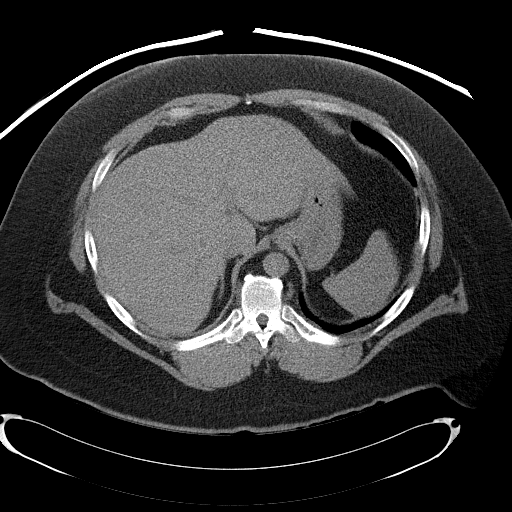
[im 97/104  soft-tissue]
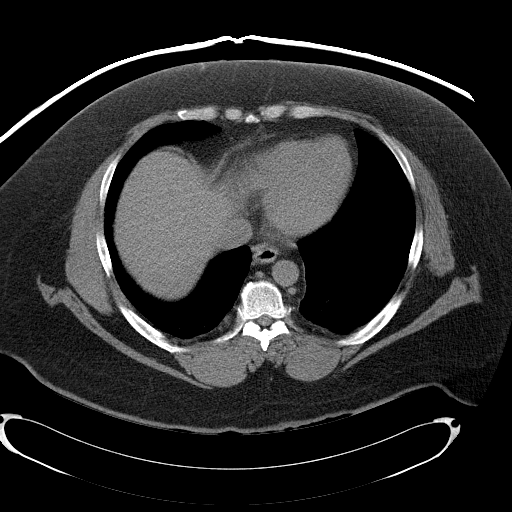

[Series 4: mpr coronal · coronal · 0.89mm/px · 3 of 102 slices shown]
[im 34/102  soft-tissue]
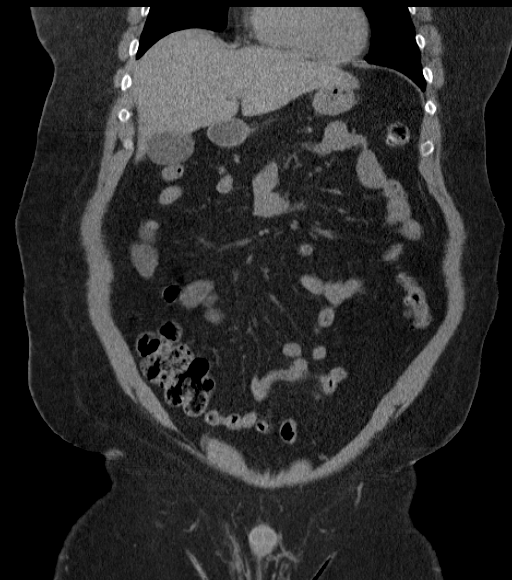
[im 45/102  soft-tissue]
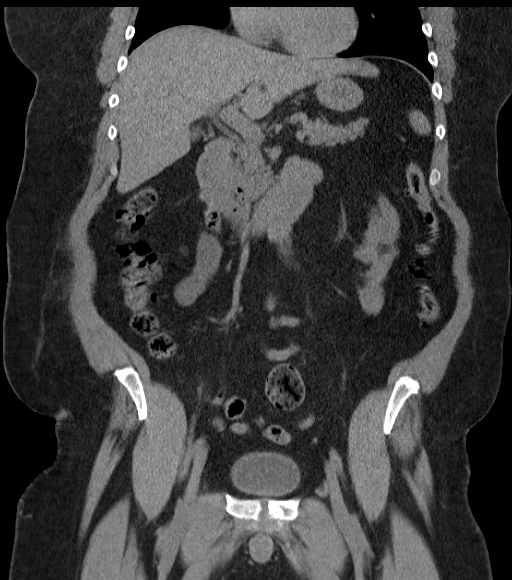
[im 57/102  soft-tissue]
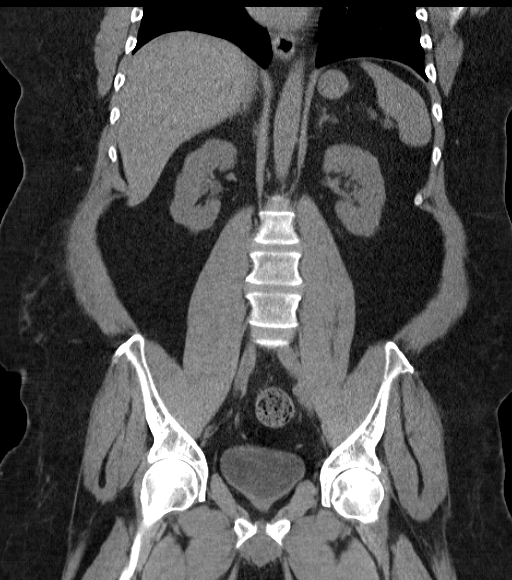

[17 of 46 positions shown; findings below may reference images not displayed]

FINDINGS: Stable lung bases with mild atelectasis. Stable
visualized osseous structures.  No interval progression of
bilateral sacroiliac joint changes described on the most recent
comparison.  No pelvic free fluid.  Redundant sigmoid colon.
Diverticulosis of the descending and sigmoid segments.  The colon
otherwise unremarkable.  Normal appendix.  No dilated small bowel.
Stomach is decompressed.  Noncontrast liver, gallbladder, spleen,
pancreas, and adrenal glands are within normal limits.

No left hydronephrosis, hydroureter, nephrolithiasis, or ureteral
calculus.  No right hydronephrosis  or hydroureter.  No
nephrolithiasis or right ureteral calculus.  Bladder is
unremarkable.  Occasional pelvic phleboliths are stable.  No free
fluid.  Unchanged noncontrast appearance of the kidneys and 4009.
No focal inflammatory stranding.
IMPRESSION: No acute findings in the abdomen or pelvis.  No urologic calculus.
Sacroiliac joint changes described on the most recent comparison
appear stable since November 2009.

## 2011-09-18 ENCOUNTER — Ambulatory Visit (HOSPITAL_COMMUNITY): Payer: Medicare Other | Admitting: Physical Therapy

## 2011-09-21 IMAGING — CR DG CHEST 1V
1 series · 1 of 1 positions shown · non-contrast
Comparison: 01/18/2010.

CLINICAL DATA: History of pain.

CHEST - 1 VIEW

[view not recorded]
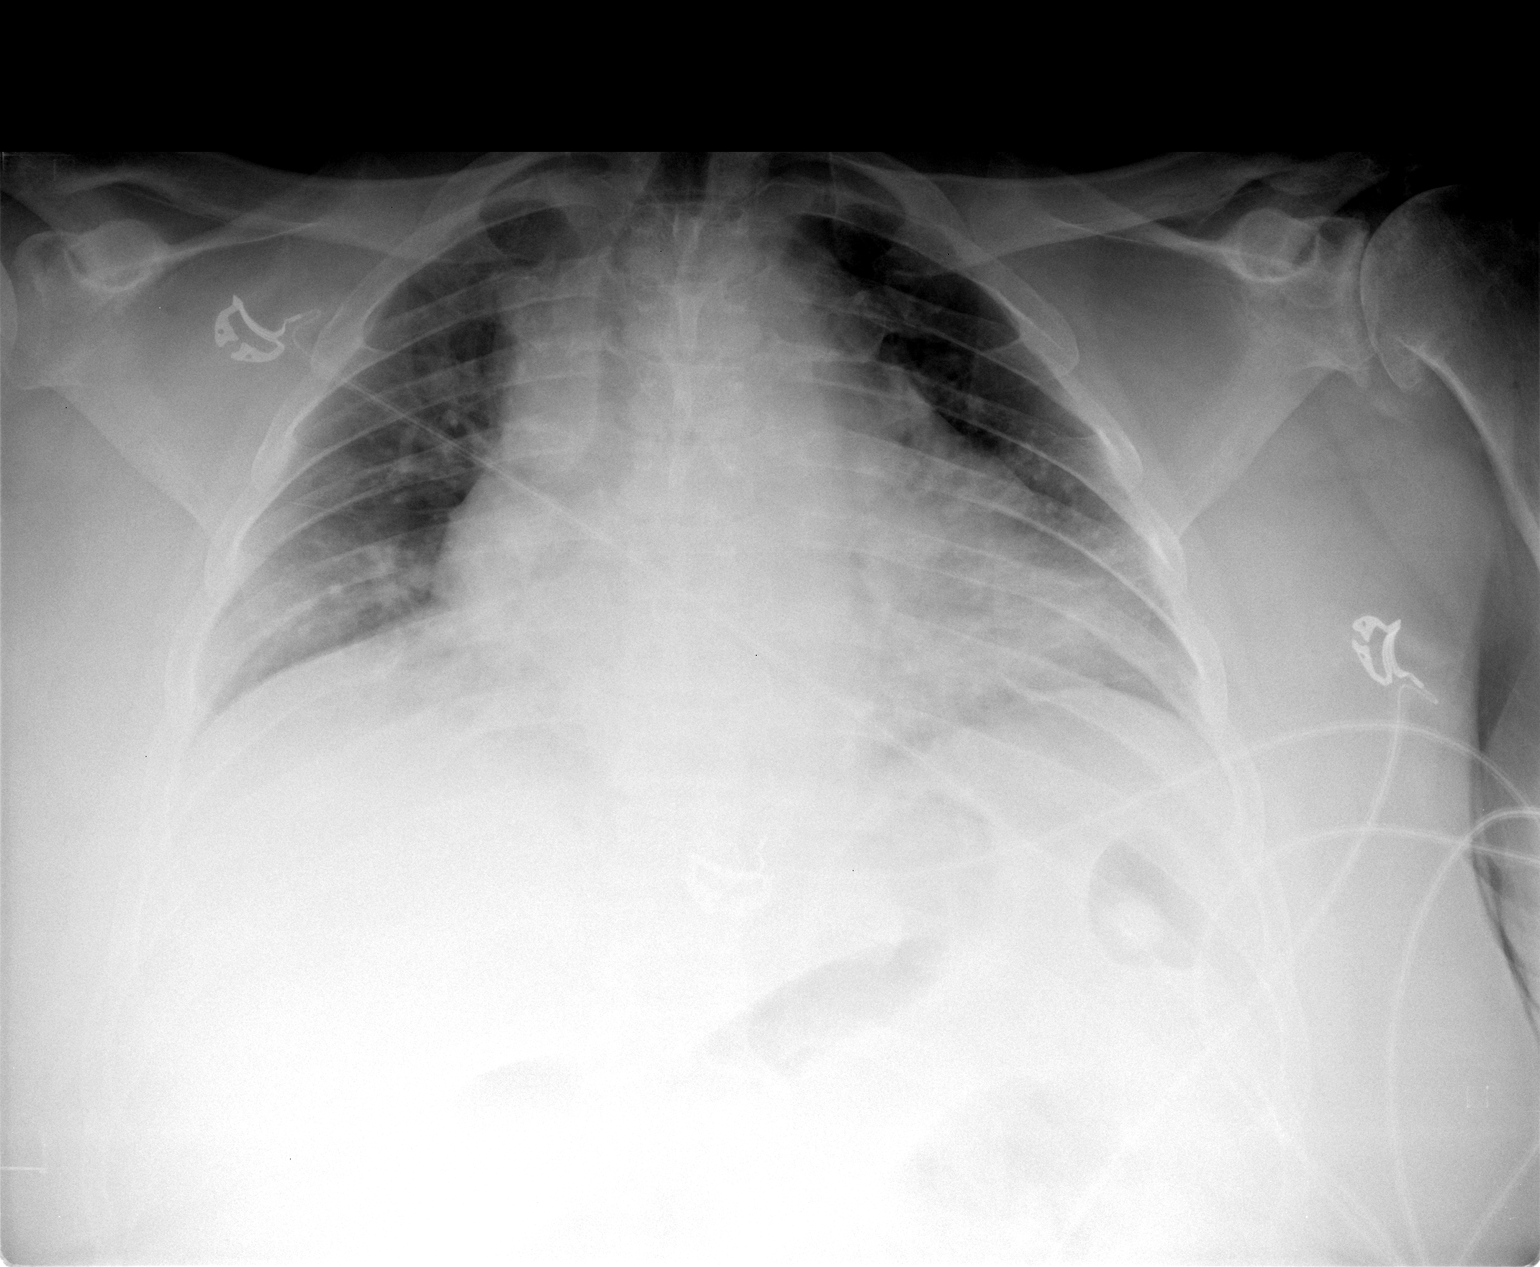

[1 of 1 positions shown; findings below may reference images not displayed]

FINDINGS: Obesity compromises detail.  There are low lung volumes.
No pulmonary edema, pneumonia, or pleural effusion is seen.  No
pneumothorax is evident.  There is moderate enlargement of the
cardiac silhouette accentuated by the AP magnification.
IMPRESSION: Obesity.  Low lung volumes.  Moderate enlargement of the cardiac
silhouette.

## 2011-09-21 IMAGING — NM NM PULMONARY VENT & PERF
2 series · 12 of 12 positions shown · non-contrast
Comparison: Chest radiographic examination of same date.

CLINICAL DATA: History of pain.

NUCLEAR MEDICINE VENTILATION - PERFUSION LUNG SCAN
TECHNIQUE: Wash-in, equilibrium, and wash-out phase ventilation
images were obtained using Fe-S66 gas.  Perfusion images were
obtained in multiple projections after intravenous injection of Tc-
99m MAA.
Radiopharmaceuticals:  20.6 mCi of xenon 133 gas. mCi Fe-S66 gas
and 5.5 mCi Dc-22m MAA.

[lung vq · 3.20mm/px · 6 of 16 frames shown (1 of 2)]
[frame 2/16  full-range]
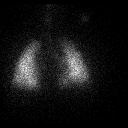
[frame 4/16  full-range]
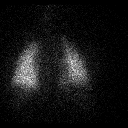
[frame 7/16  full-range]
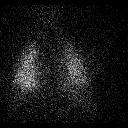
[frame 10/16  full-range]
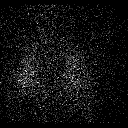
[frame 12/16  full-range]
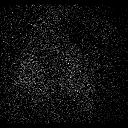
[frame 15/16  full-range]
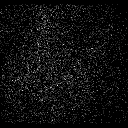

[lung vq · 3.20mm/px · 6 of 16 frames shown (2 of 2)]
[frame 2/16]
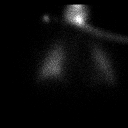
[frame 4/16]
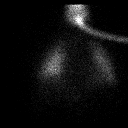
[frame 7/16]
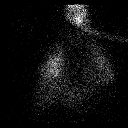
[frame 10/16]
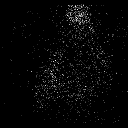
[frame 12/16]
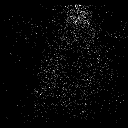
[frame 15/16]
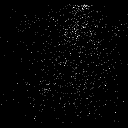

[12 of 12 positions shown; findings below may reference images not displayed]

FINDINGS: Ventilation study:  On the breath-holding and equilibrium phases of
the ventilation study there is expected distribution of the xenon
gas.  No lobar are or segmental defect is seen.  On washout phase
no significant air trapping was evident.

Perfusion study:  There is normal distribution of the MAA
particles.  No lobar or segmental defect is evident.  No
ventilation perfusion mismatch is evident.
IMPRESSION: No lobar or segmental ventilation or perfusion defects are seen.
No ventilation perfusion mismatch is evident.

Examination is low probability for pulmonary embolism.

## 2011-09-23 ENCOUNTER — Ambulatory Visit (HOSPITAL_COMMUNITY)
Admission: RE | Admit: 2011-09-23 | Discharge: 2011-09-23 | Disposition: A | Discharge status: Home / Self Care | Payer: Medicare Other | Source: Ambulatory Visit | Attending: Internal Medicine | Admitting: Internal Medicine

## 2011-09-23 ENCOUNTER — Emergency Department (HOSPITAL_COMMUNITY)
Admission: EM | Admit: 2011-09-23 | Discharge: 2011-09-23 | Disposition: A | Discharge status: Home / Self Care | Payer: Medicare Other | Attending: Emergency Medicine | Admitting: Emergency Medicine

## 2011-09-23 ENCOUNTER — Encounter (HOSPITAL_COMMUNITY): Payer: Self-pay | Admitting: *Deleted

## 2011-09-23 DIAGNOSIS — M109 Gout, unspecified: Secondary | ICD-10-CM | POA: Insufficient documentation

## 2011-09-23 DIAGNOSIS — Z809 Family history of malignant neoplasm, unspecified: Secondary | ICD-10-CM | POA: Insufficient documentation

## 2011-09-23 DIAGNOSIS — Z8249 Family history of ischemic heart disease and other diseases of the circulatory system: Secondary | ICD-10-CM | POA: Insufficient documentation

## 2011-09-23 DIAGNOSIS — M549 Dorsalgia, unspecified: Secondary | ICD-10-CM | POA: Insufficient documentation

## 2011-09-23 DIAGNOSIS — N289 Disorder of kidney and ureter, unspecified: Secondary | ICD-10-CM | POA: Insufficient documentation

## 2011-09-23 DIAGNOSIS — Z86718 Personal history of other venous thrombosis and embolism: Secondary | ICD-10-CM | POA: Insufficient documentation

## 2011-09-23 DIAGNOSIS — E119 Type 2 diabetes mellitus without complications: Secondary | ICD-10-CM | POA: Insufficient documentation

## 2011-09-23 DIAGNOSIS — Z8489 Family history of other specified conditions: Secondary | ICD-10-CM | POA: Insufficient documentation

## 2011-09-23 DIAGNOSIS — I872 Venous insufficiency (chronic) (peripheral): Secondary | ICD-10-CM | POA: Insufficient documentation

## 2011-09-23 DIAGNOSIS — G589 Mononeuropathy, unspecified: Secondary | ICD-10-CM | POA: Insufficient documentation

## 2011-09-23 DIAGNOSIS — N184 Chronic kidney disease, stage 4 (severe): Secondary | ICD-10-CM | POA: Insufficient documentation

## 2011-09-23 DIAGNOSIS — G8929 Other chronic pain: Secondary | ICD-10-CM

## 2011-09-23 DIAGNOSIS — R609 Edema, unspecified: Secondary | ICD-10-CM | POA: Insufficient documentation

## 2011-09-23 DIAGNOSIS — Z833 Family history of diabetes mellitus: Secondary | ICD-10-CM | POA: Insufficient documentation

## 2011-09-23 DIAGNOSIS — K5900 Constipation, unspecified: Secondary | ICD-10-CM | POA: Insufficient documentation

## 2011-09-23 DIAGNOSIS — N400 Enlarged prostate without lower urinary tract symptoms: Secondary | ICD-10-CM | POA: Insufficient documentation

## 2011-09-23 DIAGNOSIS — D649 Anemia, unspecified: Secondary | ICD-10-CM | POA: Insufficient documentation

## 2011-09-23 DIAGNOSIS — I129 Hypertensive chronic kidney disease with stage 1 through stage 4 chronic kidney disease, or unspecified chronic kidney disease: Secondary | ICD-10-CM | POA: Insufficient documentation

## 2011-09-23 LAB — URINALYSIS, ROUTINE W REFLEX MICROSCOPIC
Bilirubin Urine: NEGATIVE
Glucose, UA: NEGATIVE mg/dL
Hgb urine dipstick: NEGATIVE
Ketones, ur: NEGATIVE mg/dL
Protein, ur: 100 mg/dL — AB
Urobilinogen, UA: 0.2 mg/dL (ref 0.0–1.0)

## 2011-09-23 LAB — BASIC METABOLIC PANEL
BUN: 38 mg/dL — ABNORMAL HIGH (ref 6–23)
CO2: 21 mEq/L (ref 19–32)
Calcium: 10 mg/dL (ref 8.4–10.5)
Creatinine, Ser: 3.61 mg/dL — ABNORMAL HIGH (ref 0.50–1.35)
GFR calc non Af Amer: 19 mL/min — ABNORMAL LOW (ref >90)
Glucose, Bld: 109 mg/dL — ABNORMAL HIGH (ref 70–99)
Sodium: 139 mEq/L (ref 135–145)

## 2011-09-23 LAB — URINE MICROSCOPIC-ADD ON

## 2011-09-23 MED ORDER — OXYCODONE-ACETAMINOPHEN 5-325 MG PO TABS: 2.0000 | ORAL_TABLET | ORAL | Status: AC | PRN

## 2011-09-23 MED ORDER — OXYCODONE-ACETAMINOPHEN 5-325 MG PO TABS
2.0000 | ORAL_TABLET | Freq: Once | ORAL | Status: AC
  Administered 2011-09-23: 2 via ORAL
  Filled 2011-09-23: qty 2

## 2011-09-23 NOTE — ED Provider Notes (Addendum)
History  This chart was scribed for Orlie Dakin, MD by Jenne Campus. This patient was seen in room APA05/APA05 and the patient's care was started at 3:00PM.  CSN: [id number]  Arrival date & time 09/23/11  1452   First MD Initiated Contact with Patient 09/23/11 1500      Chief Complaint  Patient presents with  . Back Pain    Patient is a 46 y.o. male presenting with back pain. The history is provided by the patient. No language interpreter was used.  Back Pain  This is a chronic problem. The current episode started 1 to 2 hours ago. The problem occurs constantly. The problem has been gradually worsening. The pain is present in the lumbar spine. The pain radiates to the right thigh. He has tried nothing for the symptoms.    Jack Irwin is a 46 y.o. male with a h/o chronic back pain who presents to the Emergency Department complaining of 2 to 3 hours of right sided paralumbar lower back pain that radiates to the right groin with associated numbness of bilateral fingers. The pain is worse with walking and improved with rest. He denies taking any treatment to improve symptoms. He reports that he used to take Oxycodone for the chronic back pain.[name] He reports that he has experienced similar pain before attributed to his kidneys and is requesting blood work. He was seen recently for a diabetic toe ulcer and reports that he finished the antibiotic with improvement in the wound. He denies fever and bowel or bladder incontinence. He reports that he has renal failure but denies being a dialysis patient. He denies smoking and alcohol use. No fever no, no incontinence  Dr. [name] is PCP.    Past Medical History  Diagnosis Date  . Hypertension   . Diabetes mellitus   . Gout   . Chronic back pain   . BPH (benign prostatic hyperplasia)   . Anemia   . DVT (deep venous thrombosis)   . Neuropathy   . Decubitus ulcer     of 2nd toes of both feet.  . Edema leg   . Constipation   . Venous  (peripheral) insufficiency   . Neuropathy, diabetic   . Physical deconditioning   . Renal insufficiency   . Chronic kidney disease   . Chronic kidney disease (CKD), stage IV (severe)     Past Surgical History  Procedure Date  . None     Family History  Problem Relation Age of Onset  . Diabetes Mother   . Hypertension Mother   . Heart failure Mother   . Hyperlipidemia Mother   . Cancer Father   . Diabetes Father   . Hypertension Father   . Hyperlipidemia Father     History  Substance Use Topics  . Smoking status: Never Smoker   . Smokeless tobacco: Never Used  . Alcohol Use: No      Review of Systems  Constitutional: Negative.   HENT: Negative.   Respiratory: Negative.   Cardiovascular: Negative.   Gastrointestinal: Negative.   Musculoskeletal: Positive for back pain.  Skin: Negative.   Neurological: Negative.   Hematological: Negative.   Psychiatric/Behavioral: Negative.     Allergies  Review of patient's allergies indicates no known allergies.  Home Medications   Current Outpatient Rx  Name Route Sig Dispense Refill  . ALLOPURINOL 300 MG PO TABS Oral Take 300 mg by mouth daily.    [name] AMITRIPTYLINE HCL 25 MG PO TABS Oral Take 25  mg by mouth at bedtime.    [name] CALCIUM ACETATE 667 MG PO CAPS Oral Take 667 mg by mouth 3 (three) times daily with meals.    [name] VITAMIN D 1000 UNITS PO TABS Oral Take 1,000 Units by mouth daily.    . FEBUXOSTAT 80 MG PO TABS Oral Take 1 tablet by mouth daily.    [name] METOPROLOL TARTRATE 50 MG PO TABS Oral Take 50 mg by mouth daily.     . OXYCODONE-ACETAMINOPHEN 5-325 MG PO TABS Oral Take 1 tablet by mouth daily as needed. pain    . PREDNISONE 10 MG PO TABS Oral Take 10 mg by mouth daily as needed. For arthritis and swelling    . VARDENAFIL HCL 20 MG PO TABS Oral Take 20 mg by mouth daily as needed. For sexual intercourse      Triage Vitals: BP 134/86  Pulse 89  Temp 97.8 F (36.6 C) (Oral)  Resp 18  Ht 5\' 11"  (1.803 m)  Wt 300  lb (136.079 kg)  BMI 41.84 kg/m2  SpO2 100%  Physical Exam  Nursing note and vitals reviewed. Constitutional: He is oriented to person, place, and time. He appears well-developed and well-nourished. He appears distressed.       Uncomfortable with movement in bed  HENT:  Head: Normocephalic and atraumatic.  Eyes: Conjunctivae are normal. Pupils are equal, round, and reactive to light.  Neck: Neck supple. No tracheal deviation present. No thyromegaly present.  Cardiovascular: Normal rate and regular rhythm.   No murmur heard. Pulmonary/Chest: Effort normal and breath sounds normal.  Abdominal: Soft. Bowel sounds are normal. He exhibits no distension and no mass. There is no tenderness.       Morbidly obese  Genitourinary:       Normal male genitalia  Musculoskeletal: Normal range of motion. He exhibits tenderness. He exhibits no edema.       Right-sided para lumbar tenderness . Trace pretibial pitting edema bilaterally  Neurological: He is alert and oriented to person, place, and time. He displays normal reflexes. Coordination normal.  Skin: Skin is warm and dry. No rash noted.  Psychiatric: He has a normal mood and affect.    ED Course  Procedures (including critical care time)  DIAGNOSTIC STUDIES: Oxygen Saturation is 100% on room air, normal by my interpretation.    COORDINATION OF CARE: 3:10PM-Discussed treatment plan which includes 2 Percocet pills with pt at bedside and pt agreed to plan. 3:45PM-Pt rechecked and states that he just took the 2 Percocet. Will recheck later.  Labs Reviewed  URINALYSIS, ROUTINE W REFLEX MICROSCOPIC - Abnormal; Notable for the following:    Protein, ur 100 (*)     All other components within normal limits  URINE MICROSCOPIC-ADD ON - Abnormal; Notable for the following:    Casts HYALINE CASTS (*)     All other components within normal limits  BASIC METABOLIC PANEL   No results found. Results for orders placed during the hospital encounter  of 09/23/11  URINALYSIS, ROUTINE W REFLEX MICROSCOPIC      Component Value Range   Color, Urine YELLOW  YELLOW   APPearance CLEAR  CLEAR   Specific Gravity, Urine 1.025  1.005 - 1.030   pH 5.5  5.0 - 8.0   Glucose, UA NEGATIVE  NEGATIVE mg/dL   Hgb urine dipstick NEGATIVE  NEGATIVE   Bilirubin Urine NEGATIVE  NEGATIVE   Ketones, ur NEGATIVE  NEGATIVE mg/dL   Protein, ur 100 (*) NEGATIVE mg/dL  Urobilinogen, UA 0.2  0.0 - 1.0 mg/dL   Nitrite NEGATIVE  NEGATIVE   Leukocytes, UA NEGATIVE  NEGATIVE  BASIC METABOLIC PANEL      Component Value Range   Sodium 139  135 - 145 mEq/L   Potassium 3.9  3.5 - 5.1 mEq/L   Chloride 105  96 - 112 mEq/L   CO2 21  19 - 32 mEq/L   Glucose, Bld 109 (*) 70 - 99 mg/dL   BUN 38 (*) 6 - 23 mg/dL   Creatinine, Ser 3.61 (*) 0.50 - 1.35 mg/dL   Calcium 10.0  8.4 - 10.5 mg/dL   GFR calc non Af Amer 19 (*) >90 mL/min   GFR calc Af Amer 22 (*) >90 mL/min  URINE MICROSCOPIC-ADD ON      Component Value Range   Squamous Epithelial / LPF RARE  RARE   WBC, UA 0-2  <3 WBC/hpf   Bacteria, UA RARE  RARE   Casts HYALINE CASTS (*) NEGATIVE   Dg Toe Great Right  09/09/2011  **ADDENDUM** CREATED: 09/06/2011 13:59:43  This sentence should read degenerative not degenerate changes.  **END ADDENDUM** SIGNED BY: Mark L. Golden Circle, M.D.   09/09/2011  **ADDENDUM** CREATED: 09/06/2011 13:55:00  Comparison exam 03/10/2009.  The degenerate changes have progressed in the interval from prior exam.  **END ADDENDUM** SIGNED BY: Mark L. Golden Circle, M.D.   09/06/2011  *RADIOLOGY REPORT*  Clinical Data: Pain and swelling  RIGHT GREAT TOE  Comparison: None.  Findings: Degenerate changes are noted in the first metatarsal phalangeal joint.  No acute fracture or dislocation is seen.  No erosive changes to suggest osteomyelitis are noted.  IMPRESSION: Degenerative change without acute abnormality. Original Report Authenticated By: Everlene Farrier, M.D.    No diagnosis found.  4:15 PM back pain improved  after treatment with Percocet.no tingling in fingers  MDM  Renal insufficiency is chronic . Bun/cr were 61/3.49 on 09/06/2011 Pain felt to be musculoskeletal etiology. Pt has wheelchair at homwhich he uses prn back pain  Plan rx percocet. Spoke with Dr Lowanda Foster slow and steady increasing creatinine over the past several months. He suggested the patient call office to be seen within the next 2 weeks   Diagnosis #1 chronic back pain Diagnosis #2 chronic renal insufficiency   I personally performed the services described in this documentation, which was scribed in my presence. The recorded information has been reviewed and considered.     Orlie Dakin, MD 09/23/11 [id number]  Orlie Dakin, MD 09/23/11 [phone]

## 2011-09-23 NOTE — ED Notes (Signed)
Low back pain , rt flank pain, tingling of fingers.

## 2011-09-23 NOTE — ED Notes (Signed)
Discharge instructions reviewed with pt, questions answered. Pt verbalized understanding.  

## 2011-09-23 NOTE — Progress Notes (Signed)
Occupational Therapy Treatment Patient Details  Name: Jack Irwin MRN: [id number] Date of Birth: [birth year]  Today's Date: 09/23/2011 Time: 1345-1430 OT Time Calculation (min): 45 min  Visit#: 3  of 12   Re-eval: 09/23/11    Authorization: Medicare  Authorization Time Period: By visit #10   Authorization Visit#: 3  of 10   Subjective Pain Assessment Currently in Pain?: Yes Pain Score:   4 Pain Location: Hand Pain Orientation: Right;Left Pain Type: Chronic pain  Precautions/Restrictions     Exercise/Treatments Seated Other Seated Exercises: seated ext, ret, row x 15 reps with red tband for scapular stability for increased [facility]   Elbow Exercises Elbow Flexion: Strengthening;10 reps Bar Weights/Barbell (Elbow Flexion): 1 lb Elbow Extension: Strengthening;10 reps Bar Weights/Barbell (Elbow Extension): 1 lb Forearm Supination: Strengthening;10 reps (1#) Forearm Pronation: Strengthening;10 reps (1#) Wrist Flexion: Strengthening;10 reps Bar Weights/Barbell (Wrist Flexion): 1 lb Wrist Extension: Strengthening;10 reps Bar Weights/Barbell (Wrist Extension): 1 lb    Hand Exercises Theraputty: Flatten;Grip;Roll (also used theraputty attachments for small grip & large grip) Theraputty - Flatten: Bilateral red Theraputty - Roll: bilateral red Theraputty - Grip: bilateral red (Pinch to locate 6 pennies and 6 beads) Theraputty - Locate Pegs: 6 pennies & 6 pegs with each hand in red putty        Occupational Therapy Assessment and Plan OT Assessment and Plan Clinical Impression Statement: A:  Added 6 beads to pinch and locate beads in putty.  Also added theraputty attachments which patient could screw and unscrew in red theraputty, required verbal cues to keep elbow in at side and to concentrace on using wrist and hand to turn the attachment. OT Plan: P:  Increase weight with elbow exercises to 2#, tband to green.   Goals Short Term Goals Short Term Goal 1: Patient  will be educated on HEP Short Term Goal 2: Patient will complete 9-hole peg test in 40 seconds or less with L hand for increased independence with tying shoe laces Short Term Goal 3: Patient will improve grip and pinch strength by 25% for increased independence with donning and doffing clothing Short Term Goal 4: Patient will complete BADL tasks with min assist Long Term Goals Long Term Goal 1: Patient will return to PLOF with B/IADL activities Long Term Goal 2: Patient will complete 9 hole peg test in less than 25 seconds with R hand and less than 30 seconds with L hand in order to increase independence with tying shoelaces Long Term Goal 3: Patient will improve grip and pinch strength by 50% for increased ability to hold a cup Long Term Goal 4: Patient will complete BADL tasks independently  Problem List Patient Active Problem List  Diagnosis  . Difficulty in walking  . Weakness of both legs  . Poor Balance    End of Session Activity Tolerance: Patient tolerated treatment well General Behavior During Session: [facility] for tasks performed Cognition: [facility] for tasks performed  GO    Thera Flake, [place] Condon L 09/23/2011, 3:55 PM

## 2011-09-23 NOTE — Progress Notes (Signed)
Physical Therapy Treatment Patient Details  Name: NICK JANSA MRN: [id number] Date of Birth: 20-Aug-1965  Today's Date: 09/23/2011 Time: 1435-1450 PT Time Calculation (min): 15 min Visit#: 4  of 8   Re-eval: 09/17/11 Authorization: Medicare  Authorization Visit#: 4  of 8   Charges:  Gait 12'  Subjective: Symptoms/Limitations Symptoms: Pt. states his back hurts, thinks it's his kidneys.  Reports he doesn"t think he will be able to do much today. Pain Assessment Currently in Pain?: Yes Pain Score:   6 Pain Location: Back Pain Orientation: Right Pain Type: Chronic pain   Exercise/Treatments Standing Other Standing Knee Exercises: attempt tandem 1/2 RT  Physical Therapy Assessment and Plan PT Assessment and Plan Clinical Impression Statement: Attempted tandem gait/ Pt. unable to walk greater than 12' before needing a break.  Pt. complained of his kidneys/back, stating he could not walk any longer.  Pt.  very fwd bent with ambulation and unable to improve posture with cues. Emphasized to pt. the need to ambulate more/sit less to improve back pain and strength.   After resting, 5 attempts made before pt. came to complete standing, but would not ambulate due to stating his pain was too bad.  Pt. requested therapist roll him to the ED.  Pt. taken to the ED via Wheelchair and left with receptionist to register. PT Plan: Progress with emphasis on ambulation, balance and LE strength.  Re-eval next visit   Problem List Patient Active Problem List  Diagnosis  . Difficulty in walking  . Weakness of both legs  . Poor Balance    PT - End of Session Activity Tolerance: Patient tolerated treatment well General Behavior During Session: [facility] for tasks performed Cognition: [facility] for tasks performed   Teena Irani, PTA/CLT 09/23/2011, 3:07 PM

## 2011-09-23 NOTE — ED Notes (Signed)
[facility] contacted at this time amy will notify driver of same. Di Kindle and Marya Amsler rn aware at this time. Moustapha Tooker

## 2011-09-25 ENCOUNTER — Ambulatory Visit (HOSPITAL_COMMUNITY): Payer: Medicare Other | Admitting: Specialist

## 2011-09-26 ENCOUNTER — Ambulatory Visit (HOSPITAL_COMMUNITY)
Admission: RE | Admit: 2011-09-26 | Discharge: 2011-09-26 | Disposition: A | Discharge status: Home / Self Care | Payer: Medicare Other | Source: Ambulatory Visit | Attending: Internal Medicine | Admitting: Internal Medicine

## 2011-09-26 DIAGNOSIS — R29898 Other symptoms and signs involving the musculoskeletal system: Secondary | ICD-10-CM

## 2011-09-26 DIAGNOSIS — R262 Difficulty in walking, not elsewhere classified: Secondary | ICD-10-CM

## 2011-09-26 DIAGNOSIS — R2689 Other abnormalities of gait and mobility: Secondary | ICD-10-CM

## 2011-09-26 NOTE — Evaluation (Cosign Needed Addendum)
Physical Therapy Evaluation  Patient Details  Name: Jack Irwin MRN: [id number] Date of Birth: Oct 11, 1965  Today's Date: 09/26/2011 Time: [id number] PT Time Calculation (min): 47 min  Visit#: 5  of 13   Re-eval: 10/26/11    Authorization: [employer]  Authorization Time Period:    Authorization Visit#: 5  of 10    Past Medical History:  Past Medical History  Diagnosis Date  . Hypertension   . Diabetes mellitus   . Gout   . Chronic back pain   . BPH (benign prostatic hyperplasia)   . Anemia   . DVT (deep venous thrombosis)   . Neuropathy   . Decubitus ulcer     of 2nd toes of both feet.  . Edema leg   . Constipation   . Venous (peripheral) insufficiency   . Neuropathy, diabetic   . Physical deconditioning   . Renal insufficiency   . Chronic kidney disease   . Chronic kidney disease (CKD), stage IV (severe)    Past Surgical History:  Past Surgical History  Procedure Date  . None     Subjective Symptoms/Limitations Symptoms: Pt states he was in the ER on the 20th and his kidneys were worsening. How long can you sit comfortably?: Pt has no difficulty with sitting. How long can you stand comfortably?: Pt was tested.  Pt was able able to stand for five minutes. How long can you walk comfortably?: The patient is able to walk for three minutes and 15 seconds without stopping. 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 Functional Tests:  (LEFS- 39/64 was 26/64(running and hopping excluded))  Assessment LUE Strength Grip (lbs): 20 RLE AROM (degrees) Right Knee Extension: -13  RLE Strength Right Hip Flexion: 5/5 Right Hip Extension: 3+/5 (was 3/5) Right Hip ABduction: 5/5 Right Hip ADduction: 5/5 (was 4/5) Right Knee Flexion: 5/5 (was 4/5) Right Knee Extension: 5/5 (was 4/5) Right Ankle Dorsiflexion: 5/5 Right Ankle Plantar Flexion: 4/5 LLE AROM (degrees) Left Knee Extension: -15  LLE Strength Left Hip Flexion: 5/5 Left Hip Extension: 4/5 (was 3/5) Left Hip ABduction: 5/5 Left Hip ADduction: 4/5 (was 4/5) Left Knee Flexion: 5/5 Left Knee Extension: 5/5 Left Ankle Dorsiflexion: 5/5 Left Ankle Plantar Flexion: 4/5  Exercise/Treatments Mobility/Balance  Berg Balance Test Sit to Stand: Able to stand without using hands and stabilize independently Standing Unsupported: Able to stand safely 2 minutes Sitting with Back Unsupported but Feet Supported on Floor or Stool: Able to sit safely and securely 2 minutes Stand to Sit: Sits safely with minimal use  of hands Transfers: Able to transfer safely, definite need of hands Standing Unsupported with Eyes Closed: Able to stand 10 seconds safely Standing Ubsupported with Feet Together: Able to place feet together independently and stand 1  minute safely From Standing, Reach Forward with Outstretched Arm: Can reach forward >12 cm safely (5") From Standing Position, Pick up Object from Floor: Able to pick up shoe, needs supervision From Standing Position, Turn to Look Behind Over each Shoulder: Looks behind from both sides and weight shifts well Turn 360 Degrees: Able to turn 360 degrees safely in 4 seconds or less Standing Unsupported, Alternately Place Feet on Step/Stool: Able to stand independently and safely and complete 8 steps in 20 seconds Standing Unsupported, One Foot in Front: Able to place foot tandem independently and hold 30 seconds Standing on One Leg: Able to lift leg independently and hold equal to or more than 3 seconds Total Score: 51  Timed Up and Go Test TUG: Normal TUG Normal TUG (seconds): 13  was 15   Physical Therapy Assessment and Plan PT Assessment and Plan Clinical Impression Statement: Pt has improved signficantly with balance and strength.  Work on ambulation with abs tightened for proper form,  PT Plan: concentration on proper gait; hip extension and gastroc strength.  Begin TM, ball squat to toe and balance.    Goals Home Exercise Program Pt will Perform Home Exercise Program: Independently PT Goal: Perform Home Exercise Program - Progress: Met PT Short Term Goals PT Short Term Goal 1: Goal of able to come sit to stand 4 times met.  Pt able to come sit to stand 9 times was 2 PT Short Term Goal 1 - Progress: Met PT Short Term Goal 2 - Progress: Met PT Short Term Goal 3 - Progress: Not met PT Long Term Goals PT Long Term Goal 1 - Progress: Met PT Long Term Goal 2 - Progress: Met Long Term Goal 3 Progress: Not met Long Term Goal 4 Progress: Progressing toward goal Long Term Goal 5 Progress: Met Additional PT Long Term Goals?: Yes PT Long Term Goal 6: Pt is able to come sit to stand 9 times in 30 seconds goal met PT Long Term Goal 7: Berg to be increased by 10 met  current 51 was 41. PT  Long Term Goal 8: NEW GOAL as of 09/25/11:  Increase LEFS to 45 PT Long Term Goal 9: PT to be walking in his house not using wheelchair PT Long Term Goal 10: Pt to be able to stand with ease for five minutes to complete personal grooming.  Problem List Patient Active Problem List  Diagnosis  . Difficulty in walking  . Weakness of both legs  . Poor Balance    PT - End of Session Equipment Utilized During Treatment: Gait belt Activity Tolerance: Patient tolerated treatment well General Behavior During Session: [facility] for tasks performed Cognition: [facility] for tasks performed  GP Functional Assessment Tool Used: LEFS current is at CL but eval CL was at 60% impaired now 40% impaired check at visit 10 to see if pt goes up  RUSSELL,CINDY 09/26/2011, 4:36 PM  Physician Documentation Your signature is required to indicate approval of the treatment plan as stated above.  Please sign and either send electronically or make a copy of this report for your files and return this physician signed original.   Please mark one 1.__approve of plan  2. ___approve of plan with the following conditions.   ______________________________  _____________________ Physician Signature                                                                                                             Date

## 2011-09-26 NOTE — Progress Notes (Signed)
Occupational Therapy Treatment Patient Details  Name: Jack Irwin MRN: [id number] Date of Birth: 1965/06/08  Today's Date: 09/26/2011 Time: [id number] OT Time Calculation (min): 41 min  Visit#: 4  of 12   Re-eval: 10/24/11 Assessment Diagnosis: gouty arthritis of bilateral hands  Authorization: Medicare   Authorization Time Period: By visit #10   Authorization Visit#: 4  of 10   Subjective Pain Assessment Pain Score:   4 Pain Type: Chronic pain  Precautions/Restrictions  Precautions Precautions: Fall  Exercise/Treatments Seated Other Seated Exercises: resume  Hand Exercises Theraputty:  (resume next visit) Sponges: L22 R20        Occupational Therapy Assessment and Plan OT Assessment and Plan Clinical Impression Statement: A:  See progress note for current status.  Resume all exercises missed because of reeassess OT Plan: P:  Resume all exercises, increase weight with elbow exercises to 2#, tband to green.   Goals Home Exercise Program Pt will Perform Home Exercise Program: Independently PT Goal: Perform Home Exercise Program - Progress: Met Short Term Goals Short Term Goal 1: Patient will be educated on HEP Short Term Goal 1 Progress: Met Short Term Goal 2: Patient will complete 9-hole peg test in 40 seconds or less with L hand for increased independence with tying shoe laces Short Term Goal 2 Progress: Met Short Term Goal 3: Patient will improve grip and pinch strength by 25% for increased independence with donning and doffing clothing Short Term Goal 3 Progress: Progressing toward goal Short Term Goal 4: Patient will complete BADL tasks with min assist Short Term Goal 4 Progress: Met Long Term Goals Long Term Goal 1: Patient will return to PLOF with B/IADL activities Long Term Goal 1 Progress: Progressing toward goal Long Term Goal 2: Patient will complete 9 hole peg test in less than 25 seconds with R hand and less than 30 seconds with L hand in order  to increase independence with tying shoelaces Long Term Goal 2 Progress: Partly met Long Term Goal 3: Patient will improve grip and pinch strength by 50% for increased ability to hold a cup Long Term Goal 3 Progress: Progressing toward goal Long Term Goal 4: Patient will complete BADL tasks independently Long Term Goal 4 Progress: Progressing toward goal  Problem List Patient Active Problem List  Diagnosis  . Difficulty in walking  . Weakness of both legs  . Poor Balance    End of Session Activity Tolerance: Patient tolerated treatment well General Behavior During Session: [facility] for tasks performed Cognition: [facility] for tasks performed  GO    Thera Flake, Isam Unrein L 09/26/2011, 4:31 PM

## 2011-09-30 ENCOUNTER — Ambulatory Visit (HOSPITAL_COMMUNITY)
Admission: RE | Admit: 2011-09-30 | Discharge: 2011-09-30 | Disposition: A | Discharge status: Home / Self Care | Payer: Medicare Other | Source: Ambulatory Visit | Attending: Internal Medicine | Admitting: Internal Medicine

## 2011-09-30 NOTE — Progress Notes (Signed)
Occupational Therapy Treatment Patient Details  Name: Jack Irwin MRN: [id number] Date of Birth: [birth year]  Today's Date: 09/30/2011 Time: 1350-1430 OT Time Calculation (min): 40 min  Visit#: 5  of 12   Re-eval: 10/24/11 Assessment Diagnosis: gouty arthritis of bilateral hands  Authorization: Medicare   Authorization Time Period: by visit 15  Authorization Visit#: 5  of 15   Subjective Symptoms/Limitations Symptoms: S:  I know I should not use this wheelchair as much it is just easier. Pain Assessment Currently in Pain?: No/denies  Precautions/Restrictions  Precautions Precautions: Fall  Exercise/Treatments Elbow Exercises Elbow Flexion: Standing;Strengthening;10 reps Bar Weights/Barbell (Elbow Flexion): 2 lbs Elbow Extension: Standing;Strengthening;10 reps Bar Weights/Barbell (Elbow Extension): 2 lbs Forearm Supination: Standing;Strengthening;10 reps (2#) Forearm Pronation: Standing;Strengthening;10 reps (2#) Wrist Flexion: Strengthening;10 reps Bar Weights/Barbell (Wrist Flexion): 2 lbs Wrist Extension: Strengthening;10 reps Bar Weights/Barbell (Wrist Extension): 2 lbs    Hand Exercises Theraputty: Flatten;Grip;Roll Theraputty - Flatten: Bilateral red Theraputty - Roll: bilateral red Theraputty - Grip: bilateral red Theraputty - Pinch: bilateral red with focus on good tip to tip position  Theraputty - Locate Pegs: add next session Sponges: R21,22 L25,27 Digiticizer: add next session        Occupational Therapy Assessment and Plan OT Assessment and Plan Clinical Impression Statement: A;  Patient completed multiple tasks in standing to work on functional balance and standing for increaseing I with ADL's while completing UE tasks.  Max physical assist to hold thumb in correct position to facilitate tip to tip pinch. OT Plan: P:  Resume tband, add tband to HEP and add digit strengthener.   Goals Short Term Goals Short Term Goal 1: Patient will be  educated on HEP Short Term Goal 2: Patient will complete 9-hole peg test in 40 seconds or less with L hand for increased independence with tying shoe laces Short Term Goal 3: Patient will improve grip and pinch strength by 25% for increased independence with donning and doffing clothing Short Term Goal 4: Patient will complete BADL tasks with min assist Long Term Goals Long Term Goal 1: Patient will return to PLOF with B/IADL activities Long Term Goal 2: Patient will complete 9 hole peg test in less than 25 seconds with R hand and less than 30 seconds with L hand in order to increase independence with tying shoelaces Long Term Goal 3: Patient will improve grip and pinch strength by 50% for increased ability to hold a cup Long Term Goal 4: Patient will complete BADL tasks independently  Problem List Patient Active Problem List  Diagnosis  . Difficulty in walking  . Weakness of both legs  . Poor Balance    End of Session Activity Tolerance: Patient tolerated treatment well General Behavior During Session: [facility] for tasks performed Cognition: [facility] for tasks performed  GO Functional Assessment Tool Used: UEFI 66/80 82% administered and reviewed by Hazeline Junker, OTR/L Functional Limitation: Self care Self Care Current Status [phone]): At least 1 percent but less than 20 percent impaired, limited or restricted Self Care Goal Status [id number]): 0 percent impaired, limited or restricted  Nena Alexander L 09/30/2011, 4:44 PM

## 2011-09-30 NOTE — Progress Notes (Signed)
Physical Therapy Treatment Patient Details  Name: Jack Irwin MRN: [id number] Date of Birth: Jun 22, 1965  Today's Date: 09/30/2011 Time: [id number] PT Time Calculation (min): 46 min Visit#: 6  of 13   Re-eval: 10/24/11 Authorization: Medicare  Authorization Visit#: 6  of 10   Charges: therex 12', NMR 12', gait 15'  Subjective: Symptoms/Limitations Symptoms: Pt. states he did have a kidney infection when he went to the ED; states he feels better now and has no back pain. Pain Assessment Currently in Pain?: No/denies   Exercise/Treatments Aerobic Stationary Bike: 10'@ 3.0 seat 12 at end of session Tread Mill: 3' X 2 bouts at 1.0 mph (6' total) Standing Functional Squat: 10 reps;Limitations Functional Squat Limitations: with ball to 10" step Other Standing Knee Exercises: side stepping 1RT Other Standing Knee Exercises: retro amb 1RT    Physical Therapy Assessment and Plan PT Assessment and Plan Clinical Impression Statement: Much improved activity tolerance and participation today.   Less cueing needed for proper form/posture while ambulating and performing balance activities.  Added squats with ball to 10" step, needing tactile cues for form.  Encouraged pt. to stay out of wheelchair and walk more at home, in and out of clinic and stores. PT Plan: Continue to progress; build activity tolerance and strength.     Problem List Patient Active Problem List  Diagnosis  . Difficulty in walking  . Weakness of both legs  . Poor Balance    PT - End of Session Equipment Utilized During Treatment: Gait belt Activity Tolerance: Patient tolerated treatment well General Behavior During Session: [facility] for tasks performed Cognition: [facility] for tasks performed   Teena Irani, PTA/CLT 09/30/2011, 3:34 PM

## 2011-10-02 ENCOUNTER — Ambulatory Visit (HOSPITAL_COMMUNITY)
Admission: RE | Admit: 2011-10-02 | Discharge: 2011-10-02 | Disposition: A | Discharge status: Home / Self Care | Payer: Medicare Other | Source: Ambulatory Visit | Attending: Internal Medicine | Admitting: Internal Medicine

## 2011-10-02 NOTE — Progress Notes (Signed)
Physical Therapy Treatment Patient Details  Name: Jack Irwin MRN: [id number] Date of Birth: 10/14/1965  Today's Date: 10/02/2011 Time: [id number] PT Time Calculation (min): 42 min Charges: 42' Gait Visit#: 7  of 13   Re-eval: 10/24/11   Authorization: MEDICARE  Authorization Time Period:    Authorization Visit#: 7  of 10    Subjective: Symptoms/Limitations Symptoms: Pt comes in today without w/c and ambulating indepedendently Pain Assessment Currently in Pain?: Yes  Exercise/Treatments Standing Gait Training: 1.0 for 3:30, 1.3 mph for 3 min., 1.3 for 3 min, 1.3 x3:30, 1.3-1.4 mph x3:30. Total: 15:30 0.30 miles compete w/2 HHA and constant cueing for proper gait mechanics and for motivation   Physical Therapy Assessment and Plan PT Assessment and Plan Clinical Impression Statement: Today's treatment focused on activity tolerance w/TM walking.  He was about to complete a total of 15:30 and 0.30 miles with longest bought of 3:30 @ 1.3 mph.  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     Goals    Problem List Patient Active Problem List  Diagnosis  . Difficulty in walking  . Weakness of both legs  . Poor Balance    PT Plan of Care PT Patient Instructions: Pt educated on role of calories and important of movement.  Encouraged to go to the gym in the morning and get into the pool to decrease pain and improve overall mobility Consulted and Agree with Plan of Care: Patient  [name], PT 10/02/2011, 2:34 PM

## 2011-10-02 NOTE — Progress Notes (Addendum)
Occupational Therapy Treatment Patient Details  Name: Jack Irwin MRN: [id number] Date of Birth: 02-Aug-1965  Today's Date: 10/02/2011 Time: [id number] OT Time Calculation (min): 40 min Therapeutic Exercises 40' Visit#: 6  of 12   Re-eval: 09/23/11    Authorization: Medicare  Authorization Time Period: by visit 15   Authorization Visit#: 6  of 15   Subjective Symptoms/Limitations Symptoms: S:  I want to get back to work, so my hands need to get stronger. Pain Assessment Currently in Pain?: Yes Pain Score:   1 Pain Orientation: Right;Anterior Pain Type: Chronic pain  Precautions/Restrictions   N/A  Exercise/Treatments Seated Protraction: AAROM;10 reps (2# dowel) Horizontal ABduction: AAROM;15 reps (2# dowel ) External Rotation: AAROM;15 reps (2# dowel) Internal Rotation: AAROM;15 reps (2# dowel) Flexion: AAROM;15 reps (2# dowel ) Abduction: AAROM;15 reps (2# dowel) Other Seated Exercises: Red tband for scapular stabilization ext, ret, row, ir/er x 15 reps     Hand Exercises Hand Gripper with Large Beads: yellow hand gripper 25 reps each hand Hand Gripper with Medium Beads: with green clothespin, picked up 10 medium beads with bilateral hands using 3-point pinch Sponges: left 23, 31  right 25, 31 Digiticizer: 3.0# resistance 25 reps each hand using gross grasp        Occupational Therapy Assessment and Plan OT Assessment and Plan Clinical Impression Statement: A:  Patient increased amount of sponges he could grasp with both hands, demonstating increased grip strength.  Able to depress green clothespin using 3point pinch today. OT Plan: P:  Increase amount of sponges that patient can grasp, attempt grasping large beads with yellow hand gripper. Attempt UBE  Goals Short Term Goals Short Term Goal 1: Patient will be educated on HEP Short Term Goal 2: Patient will complete 9-hole peg test in 40 seconds or less with L hand for increased independence with tying shoe  laces Short Term Goal 3: Patient will improve grip and pinch strength by 25% for increased independence with donning and doffing clothing Short Term Goal 3 Progress: Progressing toward goal Short Term Goal 4: Patient will complete BADL tasks with min assist Short Term Goal 4 Progress: Progressing toward goal Long Term Goals Long Term Goal 1: Patient will return to PLOF with B/IADL activities Long Term Goal 1 Progress: Progressing toward goal Long Term Goal 2: Patient will complete 9 hole peg test in less than 25 seconds with R hand and less than 30 seconds with L hand in order to increase independence with tying shoelaces Long Term Goal 2 Progress: Progressing toward goal Long Term Goal 3: Patient will improve grip and pinch strength by 50% for increased ability to hold a cup Long Term Goal 3 Progress: Progressing toward goal Long Term Goal 4: Patient will complete BADL tasks independently Long Term Goal 4 Progress: Progressing toward goal  Problem List Patient Active Problem List  Diagnosis  . Difficulty in walking  . Weakness of both legs  . Poor Balance    End of Session Activity Tolerance: Patient tolerated treatment well General Behavior During Session: [facility] for tasks performed Cognition: [facility] for tasks performed  [place], OTR/L  10/02/2011, 3:25 PM

## 2011-10-07 ENCOUNTER — Ambulatory Visit (HOSPITAL_COMMUNITY): Payer: Medicare Other | Admitting: Physical Therapy

## 2011-10-09 ENCOUNTER — Ambulatory Visit (HOSPITAL_COMMUNITY): Payer: Medicare Other | Admitting: Specialist

## 2011-10-14 ENCOUNTER — Ambulatory Visit (HOSPITAL_COMMUNITY): Payer: Medicare Other | Admitting: Specialist

## 2011-10-16 ENCOUNTER — Ambulatory Visit (HOSPITAL_COMMUNITY): Payer: Medicare Other

## 2011-10-16 ENCOUNTER — Ambulatory Visit (HOSPITAL_COMMUNITY): Payer: Medicare Other | Admitting: Specialist

## 2011-10-21 ENCOUNTER — Ambulatory Visit (HOSPITAL_COMMUNITY)
Admission: RE | Admit: 2011-10-21 | Discharge: 2011-10-21 | Disposition: A | Discharge status: Home / Self Care | Payer: Medicare Other | Source: Ambulatory Visit | Attending: Internal Medicine | Admitting: Internal Medicine

## 2011-10-21 DIAGNOSIS — R269 Unspecified abnormalities of gait and mobility: Secondary | ICD-10-CM | POA: Insufficient documentation

## 2011-10-21 DIAGNOSIS — M6281 Muscle weakness (generalized): Secondary | ICD-10-CM | POA: Insufficient documentation

## 2011-10-21 DIAGNOSIS — IMO0001 Reserved for inherently not codable concepts without codable children: Secondary | ICD-10-CM | POA: Insufficient documentation

## 2011-10-21 NOTE — Progress Notes (Signed)
Physical Therapy Treatment Patient Details  Name: Jack Irwin MRN: [id number] Date of Birth: 09-Sep-1965  Today's Date: 10/21/2011 Time: [id number] PT Time Calculation (min): 45 min Visit#: 8  of 13   Re-eval: 10/24/11 Authorization: MEDICARE  Authorization Visit#: 8  of 10    Subjective: Symptoms/Limitations Symptoms: Pt. states he hurts all the time.  States he missed his last visit due to a death in the family.   Pain Assessment Currently in Pain?: Yes Pain Score:   5 Pain Location: Leg Pain Orientation: Right;Left Pain Radiating Towards: legs, knees and feet hurt today   Exercise/Treatments Aerobic Tread Mill: 5' X 2 bouts, 7' X 1 bout at 1.1 mph Standing Stairs: 1 flight X 1 RT reciprocally with B HR's    Physical Therapy Assessment and Plan PT Assessment and Plan Clinical Impression Statement: Increased activity tolerance today as demonstrated with walking increased time of 5-7 minutes.  Improved motivation and willingness to try different activites.  Negotiated 1 flight of steps reciprocally using B HR's and SBA.  Goal to achieve 10'-15' with ambulation. PT Plan: Continue to progress stength and build activity tolerance/independence.  Re-eval/G-code update this week.  (next visit).   Problem List Patient Active Problem List  Diagnosis  . Difficulty in walking  . Weakness of both legs  . Poor Balance    PT - End of Session Equipment Utilized During Treatment: Gait belt Activity Tolerance: Patient tolerated treatment well General Behavior During Session: [facility] for tasks performed Cognition: [facility] for tasks performed   Teena Irani, PTA/CLT 10/21/2011, 9:41 AM

## 2011-10-21 NOTE — Progress Notes (Signed)
Occupational Therapy Treatment Patient Details  Name: Jack Irwin MRN: [id number] Date of Birth: [birth year]  Today's Date: 10/21/2011 Time: [id number] OT Time Calculation (min): 42 min  Visit#: 7  of 12   Re-eval: 10/23/11    Authorization: Medicare   Authorization Time Period: by visit 15   Authorization Visit#: 7  of 15   Subjective Symptoms/Limitations Symptoms: S: I was out and didnt come to therapy because I had a death in the family. She died in her sleep at 44 yrs old.  Pain Assessment Currently in Pain?: Other (Comment) (shoulder became painful during exercise) Pain Score:   5 Pain Location: Shoulder Pain Orientation: Right;Left Pain Radiating Towards: legs, knees and feet hurt today  Precautions/Restrictions     Exercise/Treatments    Seated Protraction: AAROM;15 reps Horizontal ABduction: AAROM;15 reps External Rotation: AAROM;15 reps Internal Rotation: AAROM;15 reps Flexion: AAROM;15 reps Abduction: AAROM;15 reps Other Seated Exercises: Red tband for scapular stabilization ext, ret, row, ir/er x 15 reps   Hand Exercises Theraputty:  (resume) Hand Gripper with Large Beads: R hand 5 large beads with yellow hand gripper; L hand 3 large beads black hand gripper Sponges: R 28, 31; L 25,30 Digiticizer: resume Other Hand Exercises: squeeze tennis ball x 10 R hand; x5 Left hand        Occupational Therapy Assessment and Plan OT Assessment and Plan Clinical Impression Statement: A: Pt used yellow gripper to pick up 5 large beads using R hand; required extended time. L hand picked up 3 beads with black gripper; required extended time. Pt  did  increase amount of sponges held in both hands. Pt completed  seated AAROM exercises with  min tactile cus for proper positioning.  Rehab Potential: Good OT Plan: P: Increase about of beads using yellow hand gripper. Resume exercises to increase grip strength, fine motor, and coordination with theraputty. Increase  reps with AAROM requiring less  cuing for proper positioning.    Goals Short Term Goals Short Term Goal 1: Patient will be educated on HEP Short Term Goal 2: Patient will complete 9-hole peg test in 40 seconds or less with L hand for increased independence with tying shoe laces Short Term Goal 3: Patient will improve grip and pinch strength by 25% for increased independence with donning and doffing clothing Short Term Goal 4: Patient will complete BADL tasks with min assist Long Term Goals Long Term Goal 1: Patient will return to PLOF with B/IADL activities Long Term Goal 2: Patient will complete 9 hole peg test in less than 25 seconds with R hand and less than 30 seconds with L hand in order to increase independence with tying shoelaces Long Term Goal 3: Patient will improve grip and pinch strength by 50% for increased ability to hold a cup Long Term Goal 4: Patient will complete BADL tasks independently  Problem List Patient Active Problem List  Diagnosis  . Difficulty in walking  . Weakness of both legs  . Poor Balance    End of Session Activity Tolerance: Patient tolerated treatment well General Behavior During Session: [facility] for tasks performed Cognition: [facility] for tasks performed  GO   Layden Caterino L. Suzanne Garbers, COTA/L  10/21/2011, 11:00 AM

## 2011-10-23 ENCOUNTER — Ambulatory Visit (HOSPITAL_COMMUNITY): Payer: Medicare Other | Admitting: Specialist

## 2011-10-25 IMAGING — CR DG ABDOMEN ACUTE W/ 1V CHEST
3 series · 3 of 3 positions shown · non-contrast
Comparison: 03/20/2010 chest radiograph, 07/26/2008 abdominal
radiographs

CLINICAL DATA: Abdominal pain, constipation

ACUTE ABDOMEN SERIES (ABDOMEN 2 VIEW & CHEST 1 VIEW)

[view not recorded (1 of 3)]
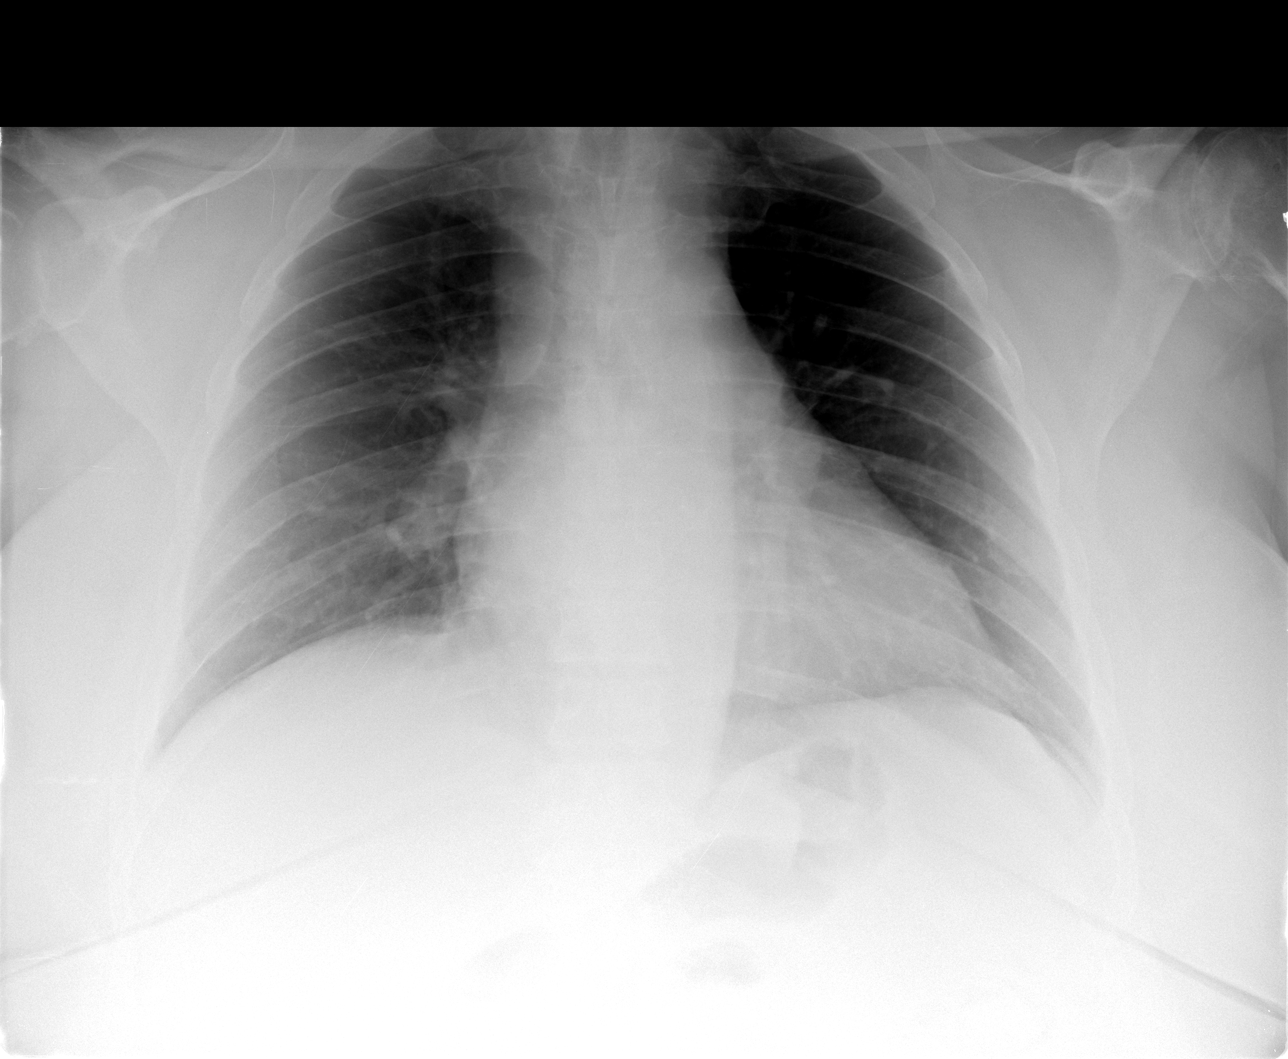

[view not recorded (2 of 3)]
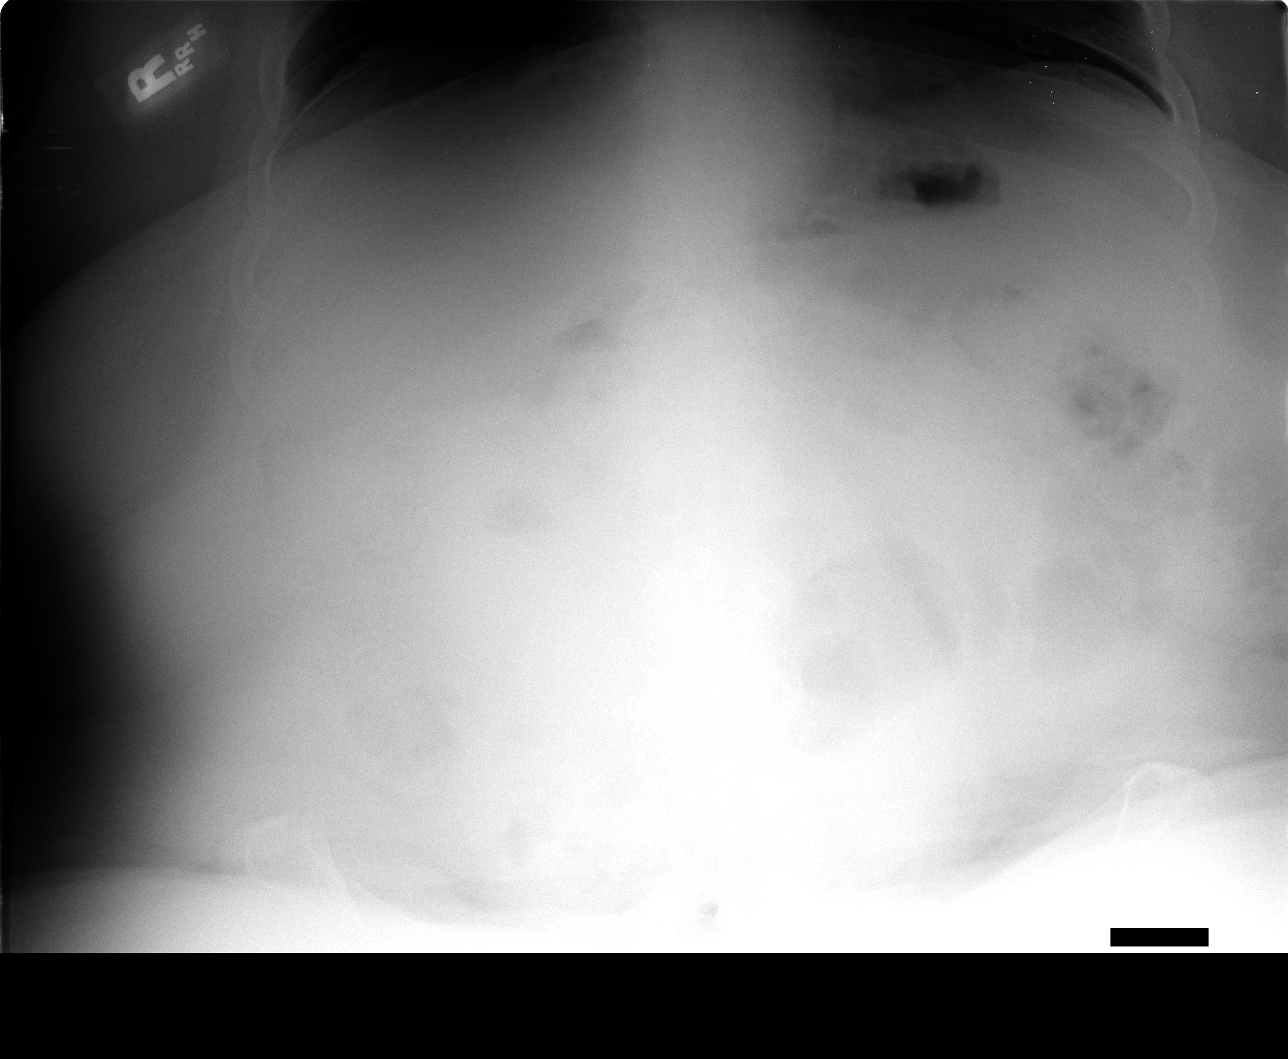

[view not recorded (3 of 3)]
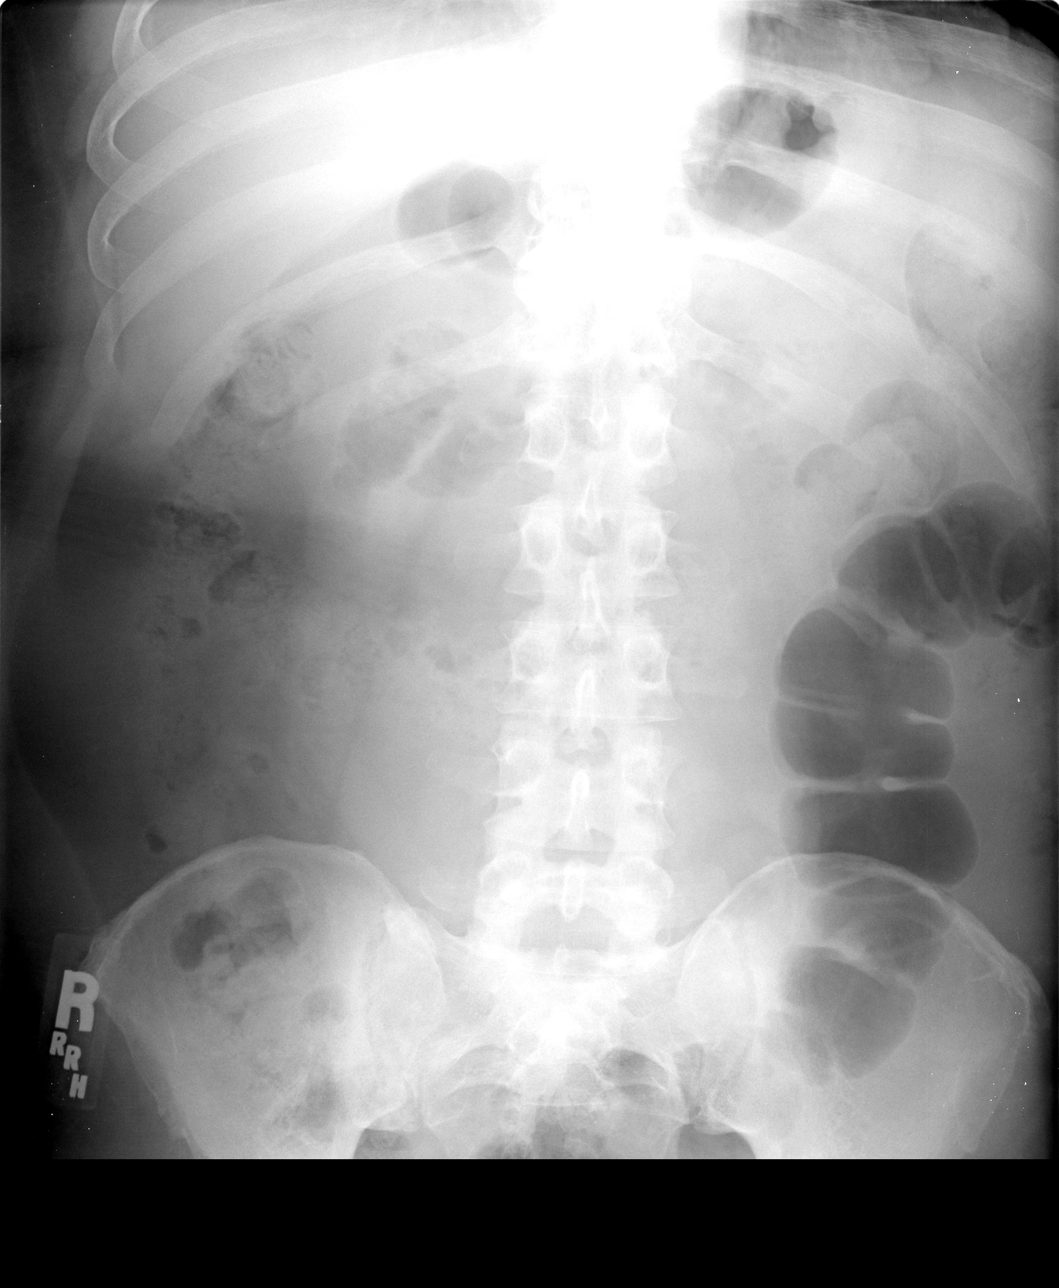

[3 of 3 positions shown; findings below may reference images not displayed]

FINDINGS: Borderline enlargement of cardiac silhouette.
Mediastinal contours and pulmonary vascularity normal.
Lungs clear.
Scattered gas and stool within colon.
No definite bowel dilatation, bowel wall thickening or evidence of
obstruction.
No free intraperitoneal air.
Osseous structures unremarkable.
No definite urinary tract calcification.
Inferior pelvis not imaged.
IMPRESSION: No definite acute abnormalities.

## 2011-10-28 ENCOUNTER — Ambulatory Visit (HOSPITAL_COMMUNITY): Payer: Medicare Other | Admitting: Specialist

## 2011-10-30 ENCOUNTER — Inpatient Hospital Stay (HOSPITAL_COMMUNITY): Admission: RE | Admit: 2011-10-30 | Payer: Medicare Other | Source: Ambulatory Visit | Admitting: Specialist

## 2011-11-04 ENCOUNTER — Ambulatory Visit (HOSPITAL_COMMUNITY): Payer: Medicare Other | Admitting: *Deleted

## 2011-11-06 ENCOUNTER — Ambulatory Visit (HOSPITAL_COMMUNITY)
Admission: RE | Admit: 2011-11-06 | Discharge: 2011-11-06 | Disposition: A | Discharge status: Home / Self Care | Payer: Medicare Other | Source: Ambulatory Visit | Attending: Internal Medicine | Admitting: Internal Medicine

## 2011-11-06 DIAGNOSIS — M6281 Muscle weakness (generalized): Secondary | ICD-10-CM | POA: Insufficient documentation

## 2011-11-06 DIAGNOSIS — IMO0001 Reserved for inherently not codable concepts without codable children: Secondary | ICD-10-CM | POA: Insufficient documentation

## 2011-11-06 DIAGNOSIS — R269 Unspecified abnormalities of gait and mobility: Secondary | ICD-10-CM | POA: Insufficient documentation

## 2011-11-06 NOTE — Evaluation (Signed)
Physical Therapy Evaluation  Patient Details  Name: Jack Irwin MRN: [id number] Date of Birth: March 27, 1965  Today's Date: 11/06/2011 Time: [id number] PT Time Calculation (min): 32 min  Visit#: 9  of 13   Re-eval: 10/24/11 Charges: MMT x 1 PPT x 15' Self care x 10'  Authorization: MEDICARE  Authorization Time Period:    Authorization Visit#: 9  of 10    Past Medical History:  Past Medical History  Diagnosis Date  . Hypertension   . Diabetes mellitus   . Gout   . Chronic back pain   . BPH (benign prostatic hyperplasia)   . Anemia   . DVT (deep venous thrombosis)   . Neuropathy   . Decubitus ulcer     of 2nd toes of both feet.  . Edema leg   . Constipation   . Venous (peripheral) insufficiency   . Neuropathy, diabetic   . Physical deconditioning   . Renal insufficiency   . Chronic kidney disease   . Chronic kidney disease (CKD), stage IV (severe)    Past Surgical History:  Past Surgical History  Procedure Date  . None     Subjective Symptoms/Limitations Symptoms: I want to conintue therapy I just have been going through a lot lately so I've had to cancel. Pain Assessment Currently in Pain?: Yes Pain Score:   7 Pain Location:  (B hands, feet and knees )   Sensation/Coordination/Flexibility/Functional Tests Functional Tests Functional Tests: 36/64 (was 39/64)  Assessment RLE Strength Right Hip Flexion: 5/5 Right Hip Extension:  (4+/5 was 3+/5) Right Hip ABduction: 5/5 Right Hip ADduction: 5/5 Right Knee Flexion: 5/5 Right Knee Extension: 5/5 Right Ankle Dorsiflexion: 5/5 Right Ankle Plantar Flexion: 4/5 (was 4/5) LLE Strength Left Hip Flexion: 5/5 Left Hip Extension:  (5/5 was 4/5) Left Hip ABduction: 5/5 Left Hip ADduction: 4/5 (was 4/5) Left Knee Flexion: 5/5 Left Knee Extension: 5/5 Left Ankle Dorsiflexion: 5/5 Left Ankle Plantar Flexion: 4/5 (was 4/5)  Exercise/Treatments Mobility/Balance  Berg Balance Test Sit to Stand: Able to  stand without using hands and stabilize independently Standing Unsupported: Able to stand safely 2 minutes Sitting with Back Unsupported but Feet Supported on Floor or Stool: Able to sit safely and securely 2 minutes Stand to Sit: Sits safely with minimal use of hands Transfers: Able to transfer safely, definite need of hands Standing Unsupported with Eyes Closed: Able to stand 10 seconds safely Standing Ubsupported with Feet Together: Able to place feet together independently and stand 1 minute safely From Standing, Reach Forward with Outstretched Arm: Can reach forward >12 cm safely (5") From Standing Position, Pick up Object from Floor: Able to pick up shoe safely and easily From Standing Position, Turn to Look Behind Over each Shoulder: Looks behind from both sides and weight shifts well Turn 360 Degrees: Able to turn 360 degrees safely in 4 seconds or less Standing Unsupported, Alternately Place Feet on Step/Stool: Able to stand independently and safely and complete 8 steps in 20 seconds Standing Unsupported, One Foot in Front: Able to place foot tandem independently and hold 30 seconds Standing on One Leg: Able to lift leg independently and hold equal to or more than 3 seconds Total Score: 52  Timed Up and Go Test Normal TUG (seconds): 12     Physical Therapy Assessment and Plan PT Assessment and Plan Clinical Impression Statement: Pt has been very inconsistent with coming to therapy. Pt has made minimal progress over the past few weeks. Pt continues to require constant motivation  to complete tasks. Pt educated on the importance of completing HEP and staying active. Recommended [employer] membership to pt.  PT Plan: Recommend D/C to HEP secondary to minimal progress.    Goals PT Long Term Goals PT Long Term Goal 1: NEW GOAL as of 09/25/11: Increase LEFS to 45  PT Long Term Goal 1 - Progress: Not met PT Long Term Goal 2: Pt to be walking in his house not using wheelchair PT Long Term Goal  2 - Progress: Not met Long Term Goal 3: Pt to be able to stand with ease for five minutes to complete personal grooming. Long Term Goal 3 Progress: Not met Long Term Goal 4: Pt able to walk for 10 minutes straight. Long Term Goal 4 Progress: Not met  Problem List Patient Active Problem List  Diagnosis  . Difficulty in walking  . Weakness of both legs  . Poor Balance    PT - End of Session Equipment Utilized During Treatment: Gait belt Activity Tolerance: Patient tolerated treatment well General Behavior During Session: [facility] for tasks performed Cognition: [facility] for tasks performed  GP Functional Limitation: Mobility: Walking and moving around Mobility: Walking and Moving Around Current Status [id number]): At least 40 percent but less than 60 percent impaired, limited or restricted Mobility: Walking and Moving Around Goal Status [phone]): At least 1 percent but less than 20 percent impaired, limited or restricted Mobility: Walking and Moving Around Discharge Status [phone]): At least 40 percent but less than 60 percent impaired, limited or restricted  Rachelle Hora, PTA 11/06/2011, 5:40 PM  Physician Documentation Your signature is required to indicate approval of the treatment plan as stated above.  Please sign and either send electronically or make a copy of this report for your files and return this physician signed original.   Please mark one 1.__approve of plan  2. ___approve of plan with the following conditions.   ______________________________                                                          _____________________ Physician Signature                                                                                                             Date

## 2011-11-08 IMAGING — CT CT ABD-PELV W/O CM
2 of 4 series · 16 of 46 positions shown, 18 images · non-contrast
Comparison: CT abdomen pelvis of 03/16/2010

CLINICAL DATA: Dysuria, elevated white blood cell count, right
groin pain for 1 week

CT ABDOMEN AND PELVIS WITHOUT CONTRAST
TECHNIQUE: Multidetector CT imaging of the abdomen and pelvis was
performed following the standard protocol without intravenous
contrast.

[Series 2: abd|pel w/o 5.0 b40f · axial · non-contrast · 0.83mm/px · z∈[-522,-28]mm · 13 of 109 slices shown, 15 images]
[im 5/109  soft-tissue]
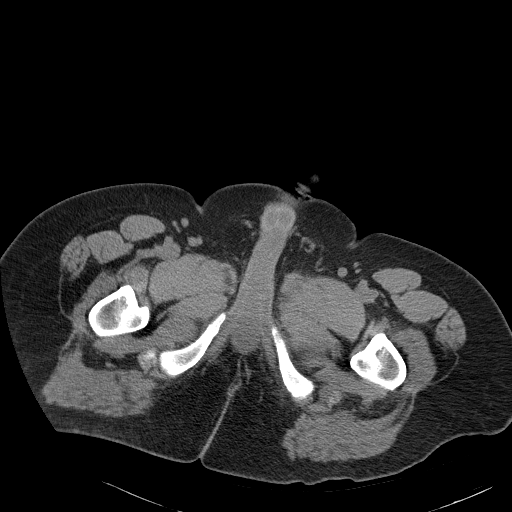
[im 5/109  bone]
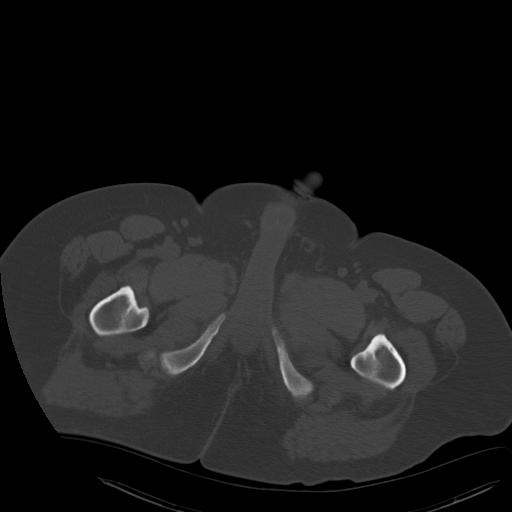
[im 13/109  soft-tissue]
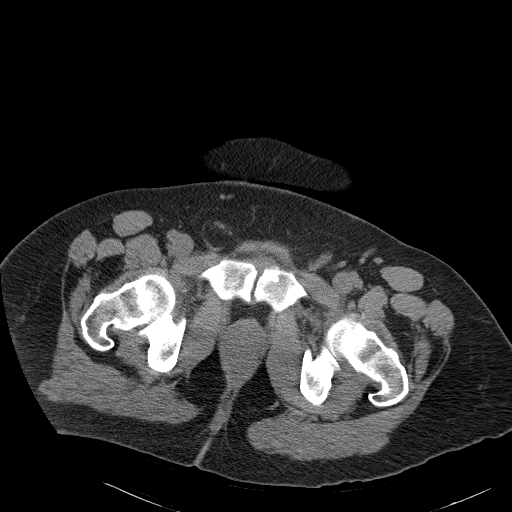
[im 22/109  soft-tissue]
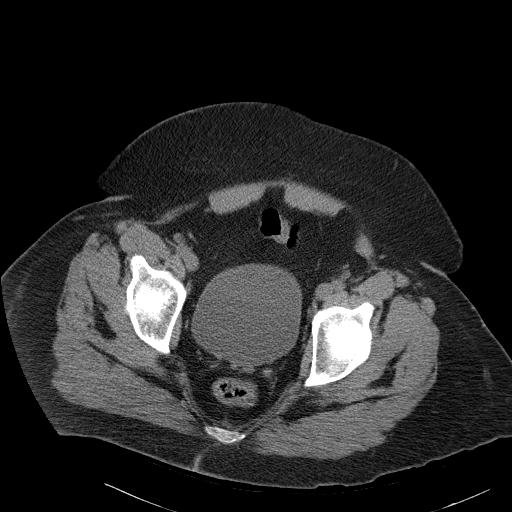
[im 31/109  soft-tissue]
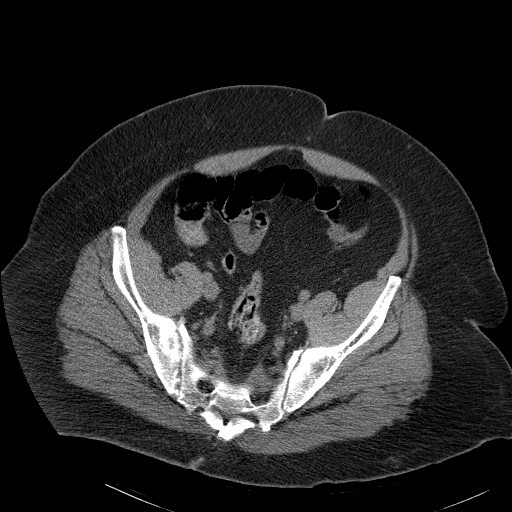
[im 39/109  soft-tissue]
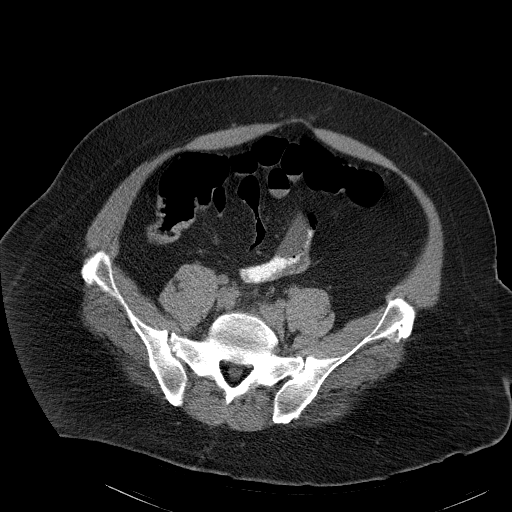
[im 48/109  soft-tissue]
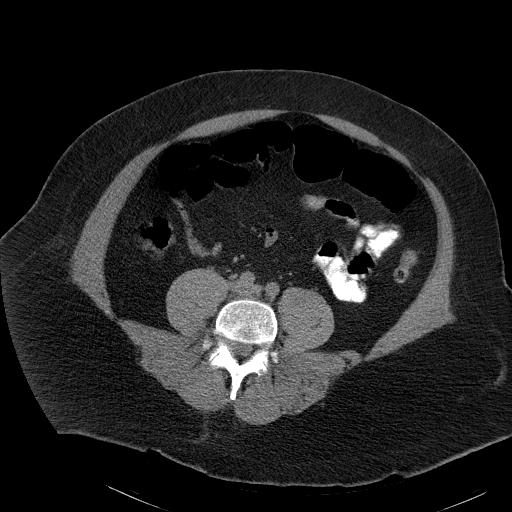
[im 57/109  soft-tissue]
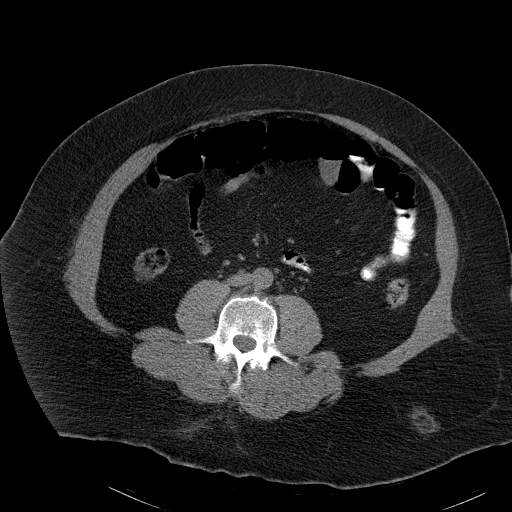
[im 61/109  soft-tissue]
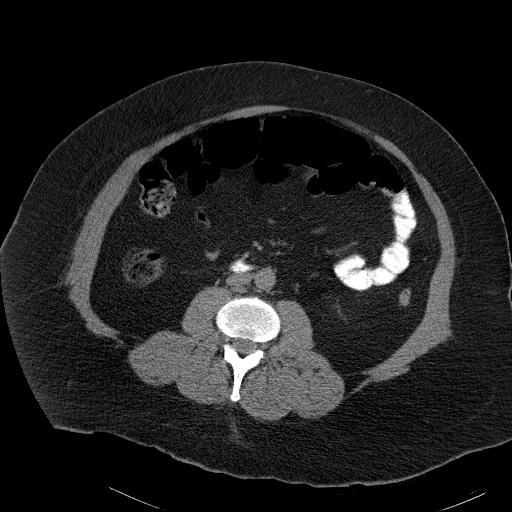
[im 70/109  soft-tissue]
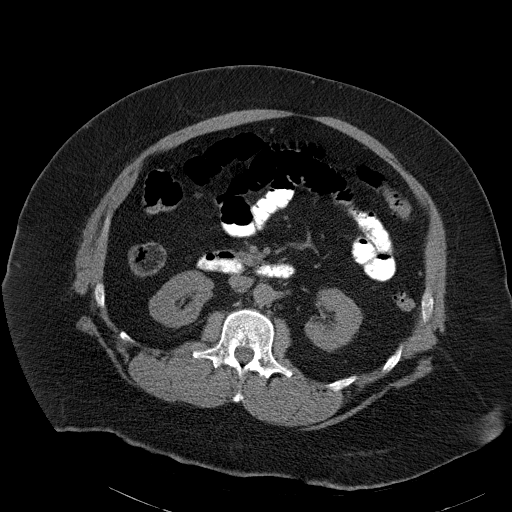
[im 70/109  bone]
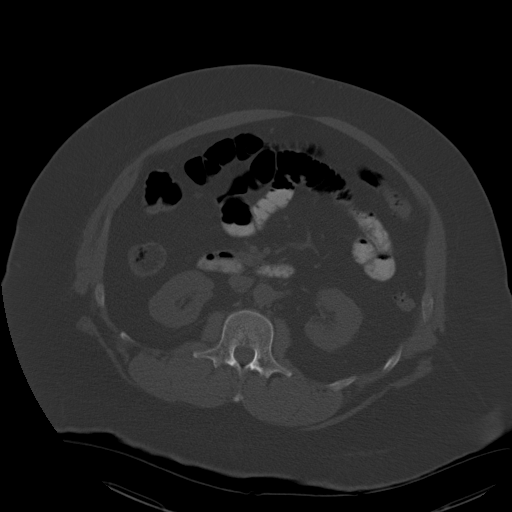
[im 78/109  soft-tissue]
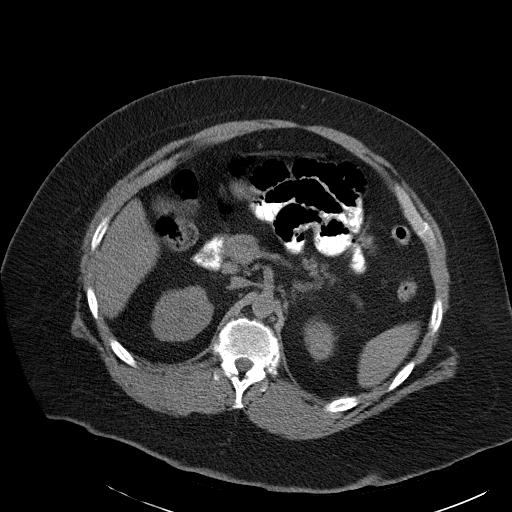
[im 87/109  soft-tissue]
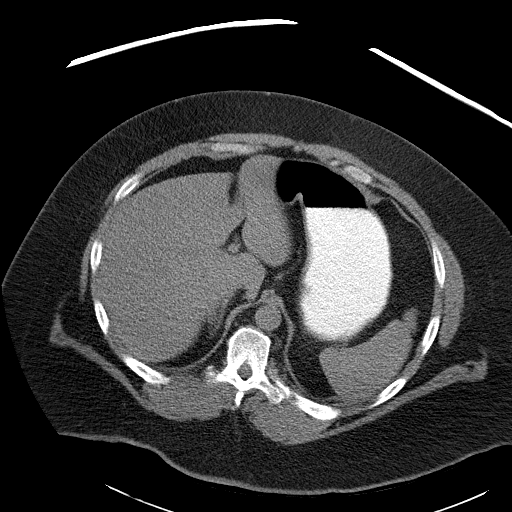
[im 96/109  soft-tissue]
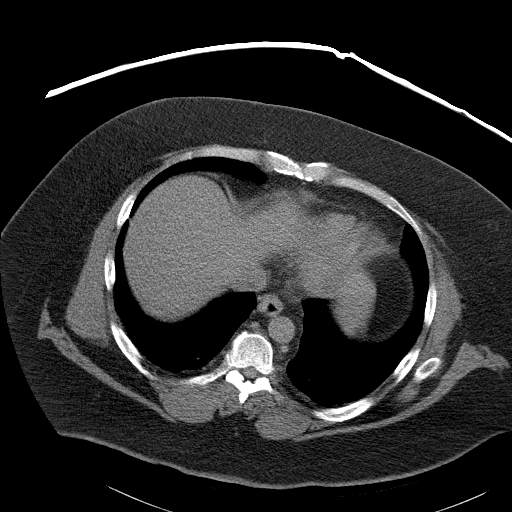
[im 104/109  soft-tissue]
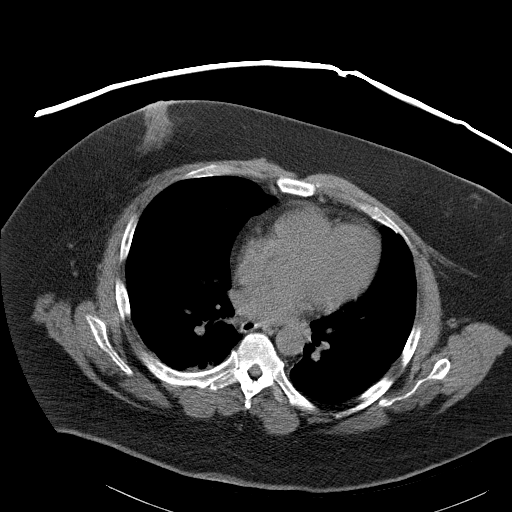

[Series 4: mpr cor (id) · coronal · 0.82mm/px · 3 of 110 slices shown]
[im 37/110  soft-tissue]
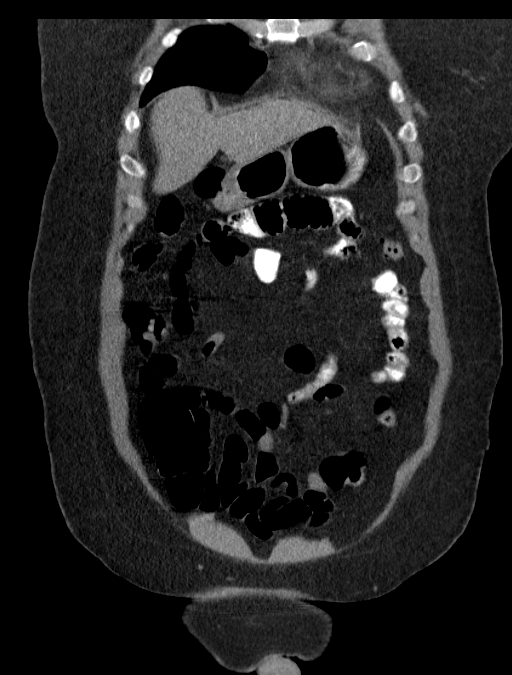
[im 49/110  soft-tissue]
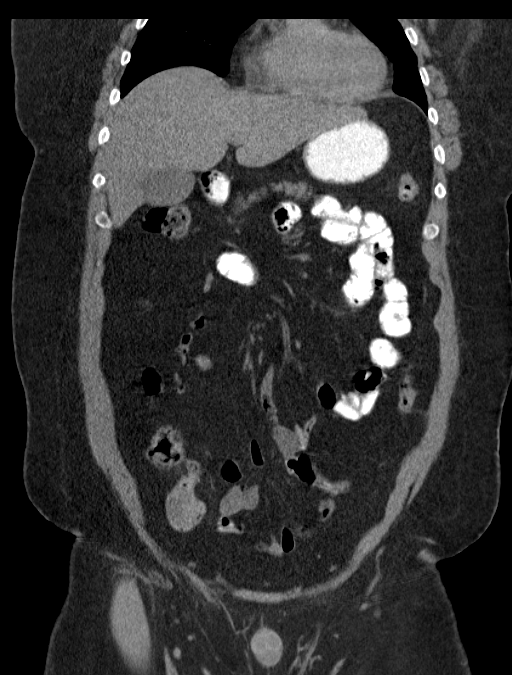
[im 61/110  soft-tissue]
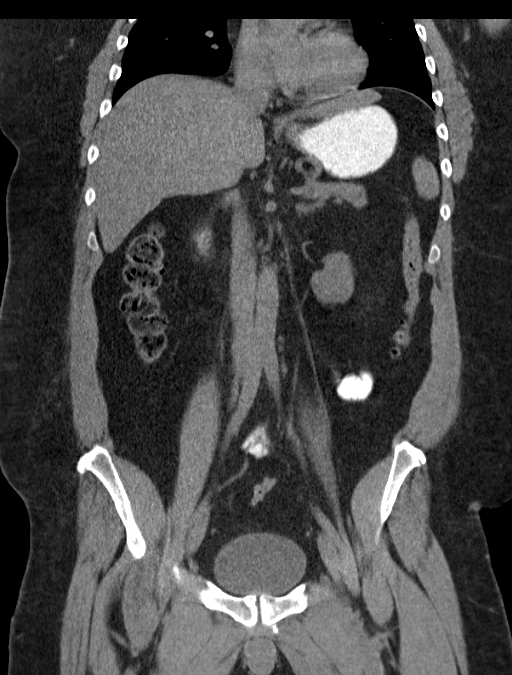

[16 of 46 positions shown; findings below may reference images not displayed]

FINDINGS: The lung bases are clear.  The liver is unremarkable in
the unenhanced state.  There is higher attenuation debris layering
within the gallbladder which may represent gallbladder sludge or
noncalcified gallstones.  No gallbladder wall thickening is seen.
The pancreas is normal in size and the pancreatic duct is not
dilated.  There may be a small incidental left adrenal adenoma
present of doubtful significance.  The spleen is normal in size.
The stomach is moderately fluid distended and unremarkable.  The
right kidney is somewhat lobular but no renal calculi or
hydronephrosis is seen.  The proximal ureters are normal in
caliber.  The abdominal aorta appears normal.

The distal ureters also are normal in caliber and no distal
ureteral calculi are seen.  The urinary bladder is urine distended
and unremarkable.  The prostate is normal in size for age.  No free
fluid is seen within the pelvis.  The appendix is visualized in the
right lower quadrant and is unremarkable, as is the terminal ileum.
Abnormality of the SI joints is again noted consistent with
sacroiliitis.
IMPRESSION: 1.  No acute abnormality on CT of the abdomen and pelvis.
2.  No renal calculi.  No hydronephrosis.
3.  The appendix and terminal ileum appear normal.
4.  Bilateral sacroiliitis again is noted.

## 2011-11-08 IMAGING — CR DG CHEST 1V PORT
1 series · 1 of 1 positions shown · non-contrast
Comparison: 03/20/2010.

CLINICAL DATA: Fever.  Pain.

PORTABLE CHEST - 1 VIEW

[view not recorded]
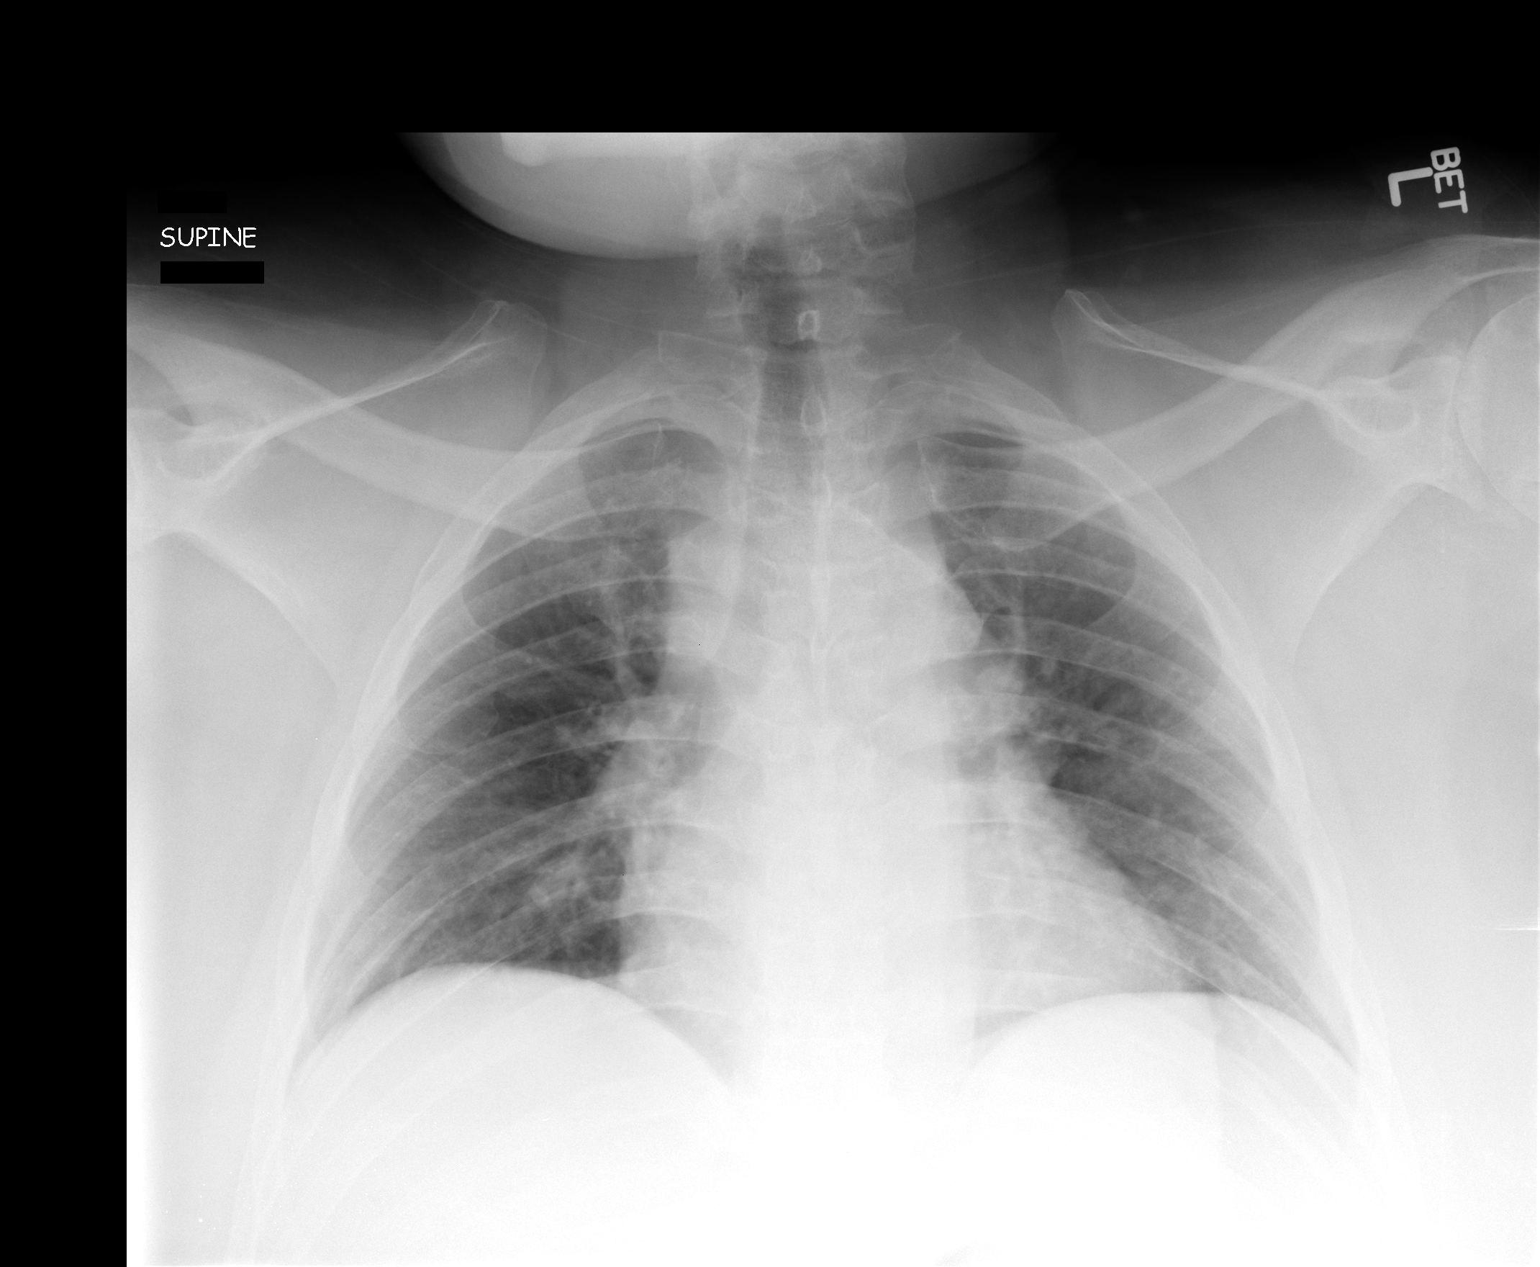

[1 of 1 positions shown; findings below may reference images not displayed]

FINDINGS: Moderate enlargement of the cardiac silhouette
accentuated by low lung volumes.  This and the supine positioning
also accentuate size of the superior mediastinum.  Lungs are free
of infiltrates.  No pneumothorax or pleural effusion is seen.
Bones appear average for age.  There is obesity.
IMPRESSION: Moderate enlargement of the heart.  Low lung volumes.  No pulmonary
infiltrates are seen.  No pleural effusions evident.  Prominence of
superior mediastinum most likely reflects  combination of low lung
volumes and supine positioning.

## 2011-11-09 IMAGING — NM NM PULMONARY VENT & PERF
2 series · 12 of 12 positions shown · non-contrast
Comparison: 03/20/2010.

CLINICAL DATA: Chest pain.  Renal failure.

NUCLEAR MEDICINE VENTILATION - PERFUSION LUNG SCAN
TECHNIQUE: Wash-in, equilibrium, and wash-out phase ventilation
images were obtained using Re-1DD gas.  Perfusion images were
obtained in multiple projections after intravenous injection of Tc-
99m MAA.
Radiopharmaceuticals:  7.9 mCi Re-1DD gas and 5.5 mCi 0c-77m MAA.

[Series 1: lung vq · 3.20mm/px · 6 of 16 frames shown (1 of 2)]
[frame 2/16  full-range]
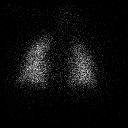
[frame 4/16]
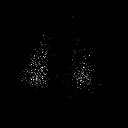
[frame 7/16]
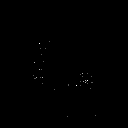
[frame 10/16]
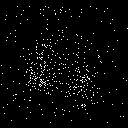
[frame 12/16]
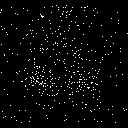
[frame 15/16]
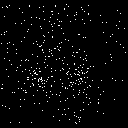

[Series 1: lung vq · 3.20mm/px · 6 of 16 frames shown (2 of 2)]
[frame 2/16  full-range]
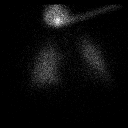
[frame 4/16  full-range]
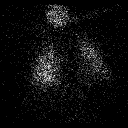
[frame 7/16]
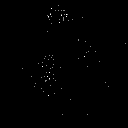
[frame 10/16]
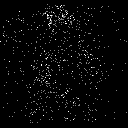
[frame 12/16]
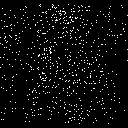
[frame 15/16]
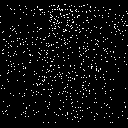

[12 of 12 positions shown; findings below may reference images not displayed]

FINDINGS: There are no perfusion defects.  The ventilatory scan
demonstrates no ventilatory defects or air trapping.
IMPRESSION: Normal ventilation perfusion lung scan.

## 2011-11-11 ENCOUNTER — Inpatient Hospital Stay (HOSPITAL_COMMUNITY): Admission: RE | Admit: 2011-11-11 | Payer: Medicare Other | Source: Ambulatory Visit | Admitting: *Deleted

## 2011-11-13 ENCOUNTER — Ambulatory Visit (HOSPITAL_COMMUNITY): Payer: Medicare Other | Admitting: Occupational Therapy

## 2011-11-13 ENCOUNTER — Inpatient Hospital Stay (HOSPITAL_COMMUNITY)
Admission: RE | Admit: 2011-11-13 | Discharge: 2011-11-13 | Payer: Medicare Other | Source: Ambulatory Visit | Attending: Occupational Therapy | Admitting: Occupational Therapy

## 2011-11-19 ENCOUNTER — Other Ambulatory Visit: Payer: Self-pay

## 2011-11-19 ENCOUNTER — Emergency Department (HOSPITAL_COMMUNITY): Payer: Medicare Other

## 2011-11-19 ENCOUNTER — Encounter (HOSPITAL_COMMUNITY): Payer: Self-pay | Admitting: *Deleted

## 2011-11-19 ENCOUNTER — Emergency Department (HOSPITAL_COMMUNITY)
Admission: EM | Admit: 2011-11-19 | Discharge: 2011-11-19 | Disposition: A | Discharge status: Home / Self Care | Payer: Medicare Other | Attending: Emergency Medicine | Admitting: Emergency Medicine

## 2011-11-19 DIAGNOSIS — E119 Type 2 diabetes mellitus without complications: Secondary | ICD-10-CM | POA: Insufficient documentation

## 2011-11-19 DIAGNOSIS — N184 Chronic kidney disease, stage 4 (severe): Secondary | ICD-10-CM | POA: Insufficient documentation

## 2011-11-19 DIAGNOSIS — M109 Gout, unspecified: Secondary | ICD-10-CM | POA: Insufficient documentation

## 2011-11-19 DIAGNOSIS — M549 Dorsalgia, unspecified: Secondary | ICD-10-CM | POA: Insufficient documentation

## 2011-11-19 DIAGNOSIS — K5900 Constipation, unspecified: Secondary | ICD-10-CM | POA: Insufficient documentation

## 2011-11-19 DIAGNOSIS — R079 Chest pain, unspecified: Secondary | ICD-10-CM

## 2011-11-19 DIAGNOSIS — Z79899 Other long term (current) drug therapy: Secondary | ICD-10-CM | POA: Insufficient documentation

## 2011-11-19 DIAGNOSIS — G8929 Other chronic pain: Secondary | ICD-10-CM | POA: Insufficient documentation

## 2011-11-19 DIAGNOSIS — L989 Disorder of the skin and subcutaneous tissue, unspecified: Secondary | ICD-10-CM | POA: Insufficient documentation

## 2011-11-19 DIAGNOSIS — IMO0002 Reserved for concepts with insufficient information to code with codable children: Secondary | ICD-10-CM | POA: Insufficient documentation

## 2011-11-19 DIAGNOSIS — IMO0001 Reserved for inherently not codable concepts without codable children: Secondary | ICD-10-CM

## 2011-11-19 DIAGNOSIS — N400 Enlarged prostate without lower urinary tract symptoms: Secondary | ICD-10-CM | POA: Insufficient documentation

## 2011-11-19 DIAGNOSIS — R0789 Other chest pain: Secondary | ICD-10-CM | POA: Insufficient documentation

## 2011-11-19 DIAGNOSIS — R42 Dizziness and giddiness: Secondary | ICD-10-CM

## 2011-11-19 DIAGNOSIS — I129 Hypertensive chronic kidney disease with stage 1 through stage 4 chronic kidney disease, or unspecified chronic kidney disease: Secondary | ICD-10-CM | POA: Insufficient documentation

## 2011-11-19 LAB — URINALYSIS, ROUTINE W REFLEX MICROSCOPIC
Bilirubin Urine: NEGATIVE
Hgb urine dipstick: NEGATIVE
Protein, ur: 100 mg/dL — AB
Urobilinogen, UA: 0.2 mg/dL (ref 0.0–1.0)

## 2011-11-19 LAB — CBC WITH DIFFERENTIAL/PLATELET
Basophils Absolute: 0 10*3/uL (ref 0.0–0.1)
Eosinophils Absolute: 0.2 10*3/uL (ref 0.0–0.7)
HCT: 39.2 % (ref 39.0–52.0)
Lymphocytes Relative: 23 % (ref 12–46)
MCHC: 31.6 g/dL (ref 30.0–36.0)
Neutro Abs: 6.3 10*3/uL (ref 1.7–7.7)
Platelets: 264 10*3/uL (ref 150–400)
RDW: 18.2 % — ABNORMAL HIGH (ref 11.5–15.5)

## 2011-11-19 LAB — BASIC METABOLIC PANEL
BUN: 37 mg/dL — ABNORMAL HIGH (ref 6–23)
Calcium: 9.6 mg/dL (ref 8.4–10.5)
GFR calc Af Amer: 31 mL/min — ABNORMAL LOW (ref >90)
GFR calc non Af Amer: 27 mL/min — ABNORMAL LOW (ref >90)
Glucose, Bld: 78 mg/dL (ref 70–99)
Sodium: 140 mEq/L (ref 135–145)

## 2011-11-19 LAB — GLUCOSE, CAPILLARY: Glucose-Capillary: 75 mg/dL (ref 70–99)

## 2011-11-19 MED ORDER — ONDANSETRON HCL 4 MG/2ML IJ SOLN
4.0000 mg | Freq: Once | INTRAMUSCULAR | Status: AC
  Administered 2011-11-19: 4 mg via INTRAVENOUS
  Filled 2011-11-19: qty 2

## 2011-11-19 MED ORDER — KETOROLAC TROMETHAMINE 30 MG/ML IJ SOLN
30.0000 mg | Freq: Once | INTRAMUSCULAR | Status: AC
  Administered 2011-11-19: 30 mg via INTRAVENOUS
  Filled 2011-11-19: qty 1

## 2011-11-19 MED ORDER — SULFAMETHOXAZOLE-TRIMETHOPRIM 800-160 MG PO TABS: 1.0000 | ORAL_TABLET | Freq: Two times a day (BID) | ORAL | Status: DC

## 2011-11-19 MED ORDER — OXYCODONE-ACETAMINOPHEN 5-325 MG PO TABS: 1.0000 | ORAL_TABLET | Freq: Four times a day (QID) | ORAL | Status: DC | PRN

## 2011-11-19 NOTE — ED Notes (Signed)
Pt c/o chest pain sharp in nature that comes and goes for the past three days, tingling and burning to bilateral arms and legs for the past few days, pt also c/o lower back pain also,,

## 2011-11-19 NOTE — ED Notes (Signed)
Pt alert & oriented x4. Patient  given discharge instructions, paperwork & prescription(s). Patient  instructed to stop at the registration desk to finish any additional paperwork. Patient  verbalized understanding. Pt left department w/ no further questions.

## 2011-11-19 NOTE — ED Provider Notes (Signed)
History     CSN: [id number]  Arrival date & time 11/19/11  1507   First MD Initiated Contact with Patient 11/19/11 1526      Chief Complaint  Patient presents with  . Chest Pain    (Consider location/radiation/quality/duration/timing/severity/associated sxs/prior treatment) HPI..... intermittent chest pain for 3 days.  Pain is described as vague and intermittent.  Also complains of dizziness   No dyspnea, diaphoresis, nausea.  Additionally patient complains of tingling in his hands and feet.  Also a knot on his right ring finger.  Nothing makes symptoms better or worse. Symptoms are mild to moderate  Past Medical History  Diagnosis Date  . Hypertension   . Diabetes mellitus   . Gout   . Chronic back pain   . BPH (benign prostatic hyperplasia)   . Anemia   . DVT (deep venous thrombosis)   . Neuropathy   . Decubitus ulcer     of 2nd toes of both feet.  . Edema leg   . Constipation   . Venous (peripheral) insufficiency   . Neuropathy, diabetic   . Physical deconditioning   . Renal insufficiency   . Chronic kidney disease   . Chronic kidney disease (CKD), stage IV (severe)     Past Surgical History  Procedure Date  . None     Family History  Problem Relation Age of Onset  . Diabetes Mother   . Hypertension Mother   . Heart failure Mother   . Hyperlipidemia Mother   . Cancer Father   . Diabetes Father   . Hypertension Father   . Hyperlipidemia Father     History  Substance Use Topics  . Smoking status: Never Smoker   . Smokeless tobacco: Never Used  . Alcohol Use: No      Review of Systems  All other systems reviewed and are negative.    Allergies  Review of patient's allergies indicates no known allergies.  Home Medications   Current Outpatient Rx  Name Route Sig Dispense Refill  . ALLOPURINOL 300 MG PO TABS Oral Take 300 mg by mouth daily. For GOUT    . AMITRIPTYLINE HCL 25 MG PO TABS Oral Take 25 mg by mouth at bedtime. For DEPRESSION    .  CALCIUM ACETATE 667 MG PO CAPS Oral Take 667 mg by mouth 3 (three) times daily with meals. BINDER    . VITAMIN D 1000 UNITS PO TABS Oral Take 1,000 Units by mouth daily.    . FEBUXOSTAT 80 MG PO TABS Oral Take 1 tablet by mouth daily. For GOUT/HIGH URIC ACID    . METOPROLOL TARTRATE 50 MG PO TABS Oral Take 50 mg by mouth daily. For HEART    . OXYCODONE-ACETAMINOPHEN 5-325 MG PO TABS Oral Take 1 tablet by mouth daily as needed. pain    . PREDNISONE 10 MG PO TABS Oral Take 10 mg by mouth daily as needed. For arthritis and swelling    . VARDENAFIL HCL 20 MG PO TABS Oral Take 20 mg by mouth daily as needed. For sexual intercourse      BP 180/107  Pulse 65  Temp 97.7 F (36.5 C) (Oral)  Resp 20  Ht 5\' 11"  (1.803 m)  Wt 300 lb (136.079 kg)  BMI 41.84 kg/m2  SpO2 100%  Physical Exam  Nursing note and vitals reviewed. Constitutional: He is oriented to person, place, and time. He appears well-developed and well-nourished.  HENT:  Head: Normocephalic and atraumatic.  Eyes: Conjunctivae normal and  EOM are normal. Pupils are equal, round, and reactive to light.  Neck: Normal range of motion. Neck supple.  Cardiovascular: Normal rate, regular rhythm and normal heart sounds.   Pulmonary/Chest: Effort normal and breath sounds normal.  Abdominal: Soft. Bowel sounds are normal.  Musculoskeletal: Normal range of motion.  Neurological: He is alert and oriented to person, place, and time.  Skin: Skin is warm and dry.       Right ring finger: One by one cm raised area on the palmar aspect of proximal phalanx.  It is nontender to palpation  Psychiatric: He has a normal mood and affect.    ED Course  Procedures (including critical care time)  Labs Reviewed  CBC WITH DIFFERENTIAL - Abnormal; Notable for the following:    Hemoglobin 12.4 (*)     RDW 18.2 (*)     All other components within normal limits  BASIC METABOLIC PANEL - Abnormal; Notable for the following:    BUN 37 (*)     Creatinine,  Ser 2.71 (*)     GFR calc non Af Amer 27 (*)     GFR calc Af Amer 31 (*)     All other components within normal limits  URINALYSIS, ROUTINE W REFLEX MICROSCOPIC - Abnormal; Notable for the following:    Protein, ur 100 (*)     All other components within normal limits  GLUCOSE, CAPILLARY  TROPONIN I  URINE MICROSCOPIC-ADD ON    Dg Chest Port 1 View  11/19/2011  *RADIOLOGY REPORT*  Clinical Data: Chest pain  PORTABLE CHEST - 1 VIEW  Comparison: 05/14/2011  Findings: 1946 hours.  Lung volumes are low. The cardiopericardial silhouette is enlarged. The lungs are clear without focal infiltrate, edema, pneumothorax or pleural effusion. Imaged bony structures of the thorax are intact. Telemetry leads overlie the chest.  IMPRESSION: Low volume film with enlargement of the cardiopericardial silhouette.   Original Report Authenticated By: ERIC A. MANSELL, M.D.    No results found.   No diagnosis found.   Date: 11/19/2011  Rate: 59  Rhythm: sinus bradycardia  QRS Axis: normal  Intervals: normal  ST/T Wave abnormalities: normal  Conduction Disutrbances:none  Narrative Interpretation:   Old EKG Reviewed: none available   MDM    Patient appears to be his normal. Screening tests were good.  Creatinine has actually dropped a small amount to 2.71.   There may be a small abscess on his right ring finger.   Rx Septra DS #20 and referral to hand surgeon if symptoms do not improve        Nat Christen, MD 11/19/11 2102

## 2011-11-24 ENCOUNTER — Encounter (HOSPITAL_COMMUNITY): Payer: Self-pay

## 2011-11-24 ENCOUNTER — Emergency Department (HOSPITAL_COMMUNITY)
Admission: EM | Admit: 2011-11-24 | Discharge: 2011-11-24 | Disposition: A | Discharge status: Home / Self Care | Payer: Medicare Other | Attending: Emergency Medicine | Admitting: Emergency Medicine

## 2011-11-24 DIAGNOSIS — Z79899 Other long term (current) drug therapy: Secondary | ICD-10-CM | POA: Insufficient documentation

## 2011-11-24 DIAGNOSIS — E1142 Type 2 diabetes mellitus with diabetic polyneuropathy: Secondary | ICD-10-CM | POA: Insufficient documentation

## 2011-11-24 DIAGNOSIS — E1140 Type 2 diabetes mellitus with diabetic neuropathy, unspecified: Secondary | ICD-10-CM

## 2011-11-24 DIAGNOSIS — M109 Gout, unspecified: Secondary | ICD-10-CM | POA: Insufficient documentation

## 2011-11-24 DIAGNOSIS — D649 Anemia, unspecified: Secondary | ICD-10-CM | POA: Insufficient documentation

## 2011-11-24 DIAGNOSIS — M791 Myalgia, unspecified site: Secondary | ICD-10-CM

## 2011-11-24 DIAGNOSIS — M549 Dorsalgia, unspecified: Secondary | ICD-10-CM | POA: Insufficient documentation

## 2011-11-24 DIAGNOSIS — M7989 Other specified soft tissue disorders: Secondary | ICD-10-CM | POA: Insufficient documentation

## 2011-11-24 DIAGNOSIS — I872 Venous insufficiency (chronic) (peripheral): Secondary | ICD-10-CM | POA: Insufficient documentation

## 2011-11-24 DIAGNOSIS — IMO0001 Reserved for inherently not codable concepts without codable children: Secondary | ICD-10-CM | POA: Insufficient documentation

## 2011-11-24 DIAGNOSIS — IMO0002 Reserved for concepts with insufficient information to code with codable children: Secondary | ICD-10-CM | POA: Insufficient documentation

## 2011-11-24 DIAGNOSIS — I129 Hypertensive chronic kidney disease with stage 1 through stage 4 chronic kidney disease, or unspecified chronic kidney disease: Secondary | ICD-10-CM | POA: Insufficient documentation

## 2011-11-24 DIAGNOSIS — E1149 Type 2 diabetes mellitus with other diabetic neurological complication: Secondary | ICD-10-CM | POA: Insufficient documentation

## 2011-11-24 MED ORDER — HYDROCODONE-ACETAMINOPHEN 5-325 MG PO TABS: 1.0000 | ORAL_TABLET | ORAL | Status: AC | PRN

## 2011-11-24 MED ORDER — HYDROCODONE-ACETAMINOPHEN 5-325 MG PO TABS
2.0000 | ORAL_TABLET | Freq: Once | ORAL | Status: AC
  Administered 2011-11-24: 2 via ORAL
  Filled 2011-11-24: qty 2

## 2011-11-24 NOTE — ED Notes (Signed)
Patient to be discharged; patient requesting to speak with MD.  Dr. Olin Hauser aware and patient informed that she will be in shortly.

## 2011-11-24 NOTE — ED Notes (Signed)
Discharge instructions given and reviewed with patient.  Prescription given for Vicodin; effects and use explained.  Patient verbalized understanding of sedating effects of medication.  Patient ambulatory in room.  Discharged home in good condition.

## 2011-11-24 NOTE — ED Notes (Signed)
Pt states he has pain "all over" numbness and tingling in both hands and fingers for several days, seen here a few days ago for same complaints, given rx for antibiotics but did not get it filled.

## 2011-11-24 NOTE — ED Provider Notes (Signed)
History     CSN: [id number]  Arrival date & time 11/24/11  0307   First MD Initiated Contact with Patient 11/24/11 0329      Chief Complaint  Patient presents with  . Pain  . Numbness    (Consider location/radiation/quality/duration/timing/severity/associated sxs/prior treatment) HPI  Jack Irwin is a 46 y.o. male  With a h/o HTN, DM, CKD stage 4, who presents to the Emergency Department complaining of continued vague body wide pain that has been present for several days. It has been accompanied by moments of dizziness, numbness to his fingertips, numbness to his feet. He was seen here for the same c/o 11/19/2011. There has been no change.He denies fever, chills, nausea, vomiting, shortness of breath, cough.  PCP Dr. [name]   Past Medical History  Diagnosis Date  . Hypertension   . Diabetes mellitus   . Gout   . Chronic back pain   . BPH (benign prostatic hyperplasia)   . Anemia   . DVT (deep venous thrombosis)   . Neuropathy   . Decubitus ulcer     of 2nd toes of both feet.  . Edema leg   . Constipation   . Venous (peripheral) insufficiency   . Neuropathy, diabetic   . Physical deconditioning   . Renal insufficiency   . Chronic kidney disease   . Chronic kidney disease (CKD), stage IV (severe)     Past Surgical History  Procedure Date  . None     Family History  Problem Relation Age of Onset  . Diabetes Mother   . Hypertension Mother   . Heart failure Mother   . Hyperlipidemia Mother   . Cancer Father   . Diabetes Father   . Hypertension Father   . Hyperlipidemia Father     History  Substance Use Topics  . Smoking status: Never Smoker   . Smokeless tobacco: Never Used  . Alcohol Use: No      Review of Systems  Constitutional: Negative for fever.       10 Systems reviewed and are negative for acute change except as noted in the HPI.  HENT: Negative for congestion.   Eyes: Negative for discharge and redness.  Respiratory: Negative for  cough and shortness of breath.   Cardiovascular: Negative for chest pain.  Gastrointestinal: Negative for vomiting and abdominal pain.  Musculoskeletal: Negative for back pain.  Skin: Negative for rash.  Neurological: Positive for dizziness and numbness. Negative for syncope and headaches.  Psychiatric/Behavioral:       No behavior change.    Allergies  Review of patient's allergies indicates no known allergies.  Home Medications   Current Outpatient Rx  Name Route Sig Dispense Refill  . AMITRIPTYLINE HCL 25 MG PO TABS Oral Take 25 mg by mouth at bedtime. For DEPRESSION    . CALCIUM ACETATE 667 MG PO CAPS Oral Take 1,334 mg by mouth 3 (three) times daily with meals. BINDER    . VITAMIN D 1000 UNITS PO TABS Oral Take 1,000 Units by mouth daily.    . COLCHICINE 0.6 MG PO TABS Oral Take 0.6 mg by mouth 2 (two) times daily as needed. For GOUT PAIN    . FEBUXOSTAT 80 MG PO TABS Oral Take 1 tablet by mouth daily. For GOUT/HIGH URIC ACID    . METOPROLOL TARTRATE 50 MG PO TABS Oral Take 50 mg by mouth daily. For HEART    . OXYCODONE-ACETAMINOPHEN 5-325 MG PO TABS Oral Take 1-2 tablets by mouth  every 6 (six) hours as needed for pain. 15 tablet 0  . PREDNISONE 10 MG PO TABS Oral Take 10 mg by mouth daily as needed. For arthritis and swelling    . VARDENAFIL HCL 20 MG PO TABS Oral Take 20 mg by mouth daily as needed. For sexual intercourse    . SULFAMETHOXAZOLE-TRIMETHOPRIM 800-160 MG PO TABS Oral Take 1 tablet by mouth every 12 (twelve) hours. 20 tablet 0    BP 183/107  Pulse 67  Temp 98.4 F (36.9 C) (Oral)  Resp 20  Ht 5\' 11"  (1.803 m)  Wt 305 lb (138.347 kg)  BMI 42.54 kg/m2  SpO2 100%  Physical Exam  Nursing note and vitals reviewed. Constitutional: He is oriented to person, place, and time. He appears well-developed and well-nourished.       Awake, alert, nontoxic appearance.Morbidly obese  HENT:  Head: Atraumatic.  Eyes: Right eye exhibits no discharge. Left eye exhibits no  discharge.  Neck: Neck supple.  Cardiovascular: Normal heart sounds.   Pulmonary/Chest: Effort normal and breath sounds normal. He exhibits no tenderness.  Abdominal: Soft. Bowel sounds are normal. There is no tenderness. There is no rebound.  Musculoskeletal: He exhibits no tenderness.       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  Neurological: He is alert and oriented to person, place, and time.       Mental status and motor strength appears baseline for patient and situation.Decreased sensation to feet in a stocking distribution. Healing sores on 2nd toes, both feet  Skin: No rash noted.  Psychiatric: He has a normal mood and affect.    ED Course  Procedures (including critical care time)    MDM  Patient with continued generalized body aches and numbness and tingling of the feet and hands. He is unable to see his PCP and is out of his pain medicines. Given analgesics with improvement. Pt stable in ED with no significant deterioration in condition.The patient appears reasonably screened and/or stabilized for discharge and I doubt any other medical condition or other [facility] requiring further screening, evaluation, or treatment in the ED at this time prior to discharge.  MDM Reviewed: nursing note, vitals and previous chart Reviewed previous: labs       [employer]. Olin Hauser, MD 11/24/11 [phone]

## 2011-11-26 ENCOUNTER — Emergency Department (HOSPITAL_COMMUNITY)
Admission: EM | Admit: 2011-11-26 | Discharge: 2011-11-26 | Disposition: A | Discharge status: Home / Self Care | Payer: Medicare Other | Attending: Emergency Medicine | Admitting: Emergency Medicine

## 2011-11-26 ENCOUNTER — Encounter (HOSPITAL_COMMUNITY): Payer: Self-pay | Admitting: *Deleted

## 2011-11-26 DIAGNOSIS — Z87441 Personal history of nephrotic syndrome: Secondary | ICD-10-CM | POA: Insufficient documentation

## 2011-11-26 DIAGNOSIS — M545 Low back pain, unspecified: Secondary | ICD-10-CM | POA: Insufficient documentation

## 2011-11-26 DIAGNOSIS — I120 Hypertensive chronic kidney disease with stage 5 chronic kidney disease or end stage renal disease: Secondary | ICD-10-CM | POA: Insufficient documentation

## 2011-11-26 DIAGNOSIS — R079 Chest pain, unspecified: Secondary | ICD-10-CM | POA: Insufficient documentation

## 2011-11-26 DIAGNOSIS — Z86718 Personal history of other venous thrombosis and embolism: Secondary | ICD-10-CM | POA: Insufficient documentation

## 2011-11-26 DIAGNOSIS — R339 Retention of urine, unspecified: Secondary | ICD-10-CM

## 2011-11-26 DIAGNOSIS — N184 Chronic kidney disease, stage 4 (severe): Secondary | ICD-10-CM | POA: Insufficient documentation

## 2011-11-26 DIAGNOSIS — Z87898 Personal history of other specified conditions: Secondary | ICD-10-CM | POA: Insufficient documentation

## 2011-11-26 DIAGNOSIS — E1149 Type 2 diabetes mellitus with other diabetic neurological complication: Secondary | ICD-10-CM | POA: Insufficient documentation

## 2011-11-26 DIAGNOSIS — Z872 Personal history of diseases of the skin and subcutaneous tissue: Secondary | ICD-10-CM | POA: Insufficient documentation

## 2011-11-26 DIAGNOSIS — G589 Mononeuropathy, unspecified: Secondary | ICD-10-CM | POA: Insufficient documentation

## 2011-11-26 DIAGNOSIS — Z79899 Other long term (current) drug therapy: Secondary | ICD-10-CM | POA: Insufficient documentation

## 2011-11-26 LAB — URINE MICROSCOPIC-ADD ON

## 2011-11-26 LAB — URINALYSIS, ROUTINE W REFLEX MICROSCOPIC
Glucose, UA: NEGATIVE mg/dL
Leukocytes, UA: NEGATIVE
Specific Gravity, Urine: 1.02 (ref 1.005–1.030)
pH: 5.5 (ref 5.0–8.0)

## 2011-11-26 MED ORDER — OXYCODONE-ACETAMINOPHEN 5-325 MG PO TABS
1.0000 | ORAL_TABLET | Freq: Once | ORAL | Status: AC
  Administered 2011-11-26: 1 via ORAL
  Filled 2011-11-26: qty 1

## 2011-11-26 NOTE — ED Provider Notes (Signed)
History     CSN: [id number]  Arrival date & time 11/26/11  1318   First MD Initiated Contact with Patient 11/26/11 1335      Chief Complaint  Patient presents with  . Urinary Retention     The history is provided by the patient.   the patient reports inability to urinate since last night.  He does report a small amount came out earlier this morning.  He reports no dysuria urinary frequency.  He has no nausea or vomiting.  He denies flank tenderness.  He does report mild low back pain as well as mild suprapubic discomfort.  No penile discharge.  No scrotal pain.  No scrotal swelling.  The patient reports his had urinary retention before in the past.  The patient was told that he was at home I Foley catheter placed and his bladder and he stated that he would prefer to try and urinate one more time at the bedside he could be given something to drink.  He was unable urinate 375 mL.  He stated he felt better after urination.  He reports transient chest pain several days ago but is no longer having any chest discomfort.  He had no shortness of breath.  He denies exertional shortness of breath.  He denies exertional chest pain.  Past Medical History  Diagnosis Date  . Hypertension   . Diabetes mellitus   . Gout   . Chronic back pain   . BPH (benign prostatic hyperplasia)   . Anemia   . DVT (deep venous thrombosis)   . Neuropathy   . Decubitus ulcer     of 2nd toes of both feet.  . Edema leg   . Constipation   . Venous (peripheral) insufficiency   . Neuropathy, diabetic   . Physical deconditioning   . Renal insufficiency   . Chronic kidney disease   . Chronic kidney disease (CKD), stage IV (severe)     Past Surgical History  Procedure Date  . None     Family History  Problem Relation Age of Onset  . Diabetes Mother   . Hypertension Mother   . Heart failure Mother   . Hyperlipidemia Mother   . Cancer Father   . Diabetes Father   . Hypertension Father   . Hyperlipidemia  Father     History  Substance Use Topics  . Smoking status: Never Smoker   . Smokeless tobacco: Never Used  . Alcohol Use: No      Review of Systems  All other systems reviewed and are negative.    Allergies  Review of patient's allergies indicates no known allergies.  Home Medications   Current Outpatient Rx  Name Route Sig Dispense Refill  . CALCIUM ACETATE 667 MG PO CAPS Oral Take 1,334 mg by mouth 3 (three) times daily with meals. BINDER    . VITAMIN D 1000 UNITS PO TABS Oral Take 1,000 Units by mouth daily.    . COLCHICINE 0.6 MG PO TABS Oral Take 0.6 mg by mouth 2 (two) times daily as needed. For GOUT PAIN    . FEBUXOSTAT 80 MG PO TABS Oral Take 1 tablet by mouth daily as needed. For GOUT/HIGH URIC ACID    . HYDROCODONE-ACETAMINOPHEN 5-325 MG PO TABS Oral Take 1 tablet by mouth every 4 (four) hours as needed for pain. 15 tablet 0  . METOPROLOL TARTRATE 50 MG PO TABS Oral Take 50 mg by mouth daily. For HEART    . OXYCODONE-ACETAMINOPHEN 5-325  MG PO TABS Oral Take 1-2 tablets by mouth every 6 (six) hours as needed.    [name] PREDNISONE 10 MG PO TABS Oral Take 10 mg by mouth daily as needed. For arthritis and swelling    . VARDENAFIL HCL 20 MG PO TABS Oral Take 20 mg by mouth daily as needed. For sexual intercourse      BP 176/108  Pulse 88  Temp 98.4 F (36.9 C) (Oral)  Resp 20  SpO2 97%  Physical Exam  Nursing note and vitals reviewed. Constitutional: He is oriented to person, place, and time. He appears well-developed and well-nourished.       Morbidly obese  HENT:  Head: Normocephalic and atraumatic.  Eyes: EOM are normal.  Neck: Normal range of motion.  Cardiovascular: Normal rate, regular rhythm, normal heart sounds and intact distal pulses.   Pulmonary/Chest: Effort normal and breath sounds normal. No respiratory distress.  Abdominal: Soft. He exhibits no distension. There is no tenderness.  Genitourinary:       Normal appearing penis and scrotum.  Patient  is circumcised.  No peroneal swelling tenderness or crepitus.  Normal testicles bilaterally.  Testicles are nontender without swelling or mass.  Musculoskeletal: Normal range of motion.  Neurological: He is alert and oriented to person, place, and time.  Skin: Skin is warm and dry.  Psychiatric: He has a normal mood and affect. Judgment normal.    ED Course  Procedures (including critical care time)   Date: 11/26/2011  Rate: 84  Rhythm: normal sinus rhythm  QRS Axis: normal  Intervals: normal  ST/T Wave abnormalities: normal  Conduction Disutrbances: none  Narrative Interpretation:   Old EKG Reviewed: No significant changes noted     Labs Reviewed  URINALYSIS, ROUTINE W REFLEX MICROSCOPIC - Abnormal; Notable for the following:    Hgb urine dipstick TRACE (*)     Protein, ur 100 (*)     All other components within normal limits  URINE MICROSCOPIC-ADD ON   No results found.   1. Urinary retention       MDM  The patient was able to urinate nearly 400 cc into the bedside urinal.  At this time he prefers to not have a catheter in place.  His GU exam is normal.  The patient feels better after emptying his bladder.  Discharge home in good condition.  He understands importance of following up in emergency apartment for new or worsening symptoms.  He has no chest pain this time.  His EKG is normal.  Both his chest and back pain seems musculoskeletal in nature.         Hoy Morn, MD 11/26/11 [phone]

## 2011-11-26 NOTE — ED Notes (Signed)
Urinary retention since last pm. Chest pain and back pain since last week.

## 2011-11-26 NOTE — ED Notes (Signed)
Pt just urinated 332ml. edp aware.

## 2011-12-02 ENCOUNTER — Encounter (HOSPITAL_COMMUNITY): Payer: Self-pay | Admitting: *Deleted

## 2011-12-02 ENCOUNTER — Emergency Department (HOSPITAL_COMMUNITY)
Admission: EM | Admit: 2011-12-02 | Discharge: 2011-12-02 | Disposition: A | Discharge status: Home / Self Care | Payer: Medicare Other | Attending: Emergency Medicine | Admitting: Emergency Medicine

## 2011-12-02 ENCOUNTER — Emergency Department (HOSPITAL_COMMUNITY): Payer: Medicare Other

## 2011-12-02 DIAGNOSIS — R300 Dysuria: Secondary | ICD-10-CM | POA: Insufficient documentation

## 2011-12-02 DIAGNOSIS — R5383 Other fatigue: Secondary | ICD-10-CM | POA: Insufficient documentation

## 2011-12-02 DIAGNOSIS — Z86718 Personal history of other venous thrombosis and embolism: Secondary | ICD-10-CM | POA: Insufficient documentation

## 2011-12-02 DIAGNOSIS — N400 Enlarged prostate without lower urinary tract symptoms: Secondary | ICD-10-CM | POA: Insufficient documentation

## 2011-12-02 DIAGNOSIS — G589 Mononeuropathy, unspecified: Secondary | ICD-10-CM | POA: Insufficient documentation

## 2011-12-02 DIAGNOSIS — N189 Chronic kidney disease, unspecified: Secondary | ICD-10-CM

## 2011-12-02 DIAGNOSIS — M1A00X0 Idiopathic chronic gout, unspecified site, without tophus (tophi): Secondary | ICD-10-CM | POA: Insufficient documentation

## 2011-12-02 DIAGNOSIS — I10 Essential (primary) hypertension: Secondary | ICD-10-CM | POA: Insufficient documentation

## 2011-12-02 DIAGNOSIS — G8929 Other chronic pain: Secondary | ICD-10-CM | POA: Insufficient documentation

## 2011-12-02 DIAGNOSIS — R5381 Other malaise: Secondary | ICD-10-CM | POA: Insufficient documentation

## 2011-12-02 DIAGNOSIS — Z862 Personal history of diseases of the blood and blood-forming organs and certain disorders involving the immune mechanism: Secondary | ICD-10-CM | POA: Insufficient documentation

## 2011-12-02 DIAGNOSIS — E1129 Type 2 diabetes mellitus with other diabetic kidney complication: Secondary | ICD-10-CM | POA: Insufficient documentation

## 2011-12-02 DIAGNOSIS — M1A9XX0 Chronic gout, unspecified, without tophus (tophi): Secondary | ICD-10-CM | POA: Insufficient documentation

## 2011-12-02 LAB — BASIC METABOLIC PANEL
Calcium: 9.6 mg/dL (ref 8.4–10.5)
Creatinine, Ser: 3.05 mg/dL — ABNORMAL HIGH (ref 0.50–1.35)
GFR calc Af Amer: 27 mL/min — ABNORMAL LOW (ref >90)

## 2011-12-02 LAB — URINALYSIS, ROUTINE W REFLEX MICROSCOPIC
Bilirubin Urine: NEGATIVE
Glucose, UA: NEGATIVE mg/dL
Hgb urine dipstick: NEGATIVE
Specific Gravity, Urine: 1.02 (ref 1.005–1.030)
pH: 5.5 (ref 5.0–8.0)

## 2011-12-02 LAB — URINE MICROSCOPIC-ADD ON

## 2011-12-02 MED ORDER — OXYCODONE-ACETAMINOPHEN 5-325 MG PO TABS
1.0000 | ORAL_TABLET | Freq: Once | ORAL | Status: AC
  Administered 2011-12-02: 1 via ORAL
  Filled 2011-12-02: qty 1

## 2011-12-02 NOTE — ED Provider Notes (Signed)
History     CSN: [id number]  Arrival date & time 12/02/11  1551   First MD Initiated Contact with Patient 12/02/11 1651      Chief Complaint  Patient presents with  . Generalized Body Aches    HPI Pt was seen at 1700.  Per pt, c/o gradual onset and persistence of constant generalized body aches and fatigue for the past several months. States he has "pains all over" and "wants to be admitted to the hospital for it."  Has not seen his PMD for same.  Denies any specific area of pain.  Pt also c/o gradual onset and persistence of constant dysuria for "a while."  Denies hematuria, no penile discharge, no testicular pain/swelling. Denies CP/SOB, no abd pain, no back pain, no N/V/D, no fevers, no rash, no focal motor weakness, no tingling/numbness in extremities.    Past Medical History  Diagnosis Date  . Hypertension   . Diabetes mellitus   . Gout   . Chronic back pain   . BPH (benign prostatic hyperplasia)   . Anemia   . DVT (deep venous thrombosis)   . Neuropathy   . Decubitus ulcer     of 2nd toes of both feet.  . Edema leg   . Constipation   . Venous (peripheral) insufficiency   . Neuropathy, diabetic   . Physical deconditioning   . Renal insufficiency   . Chronic kidney disease   . Chronic kidney disease (CKD), stage IV (severe)     Past Surgical History  Procedure Date  . None     Family History  Problem Relation Age of Onset  . Diabetes Mother   . Hypertension Mother   . Heart failure Mother   . Hyperlipidemia Mother   . Cancer Father   . Diabetes Father   . Hypertension Father   . Hyperlipidemia Father     History  Substance Use Topics  . Smoking status: Never Smoker   . Smokeless tobacco: Never Used  . Alcohol Use: No      Review of Systems ROS: Statement: All systems negative except as marked or noted in the HPI; Constitutional: +generalized weakness. Negative for fever and chills. ; ; Eyes: Negative for eye pain, redness and discharge. ; ; ENMT:  Negative for ear pain, hoarseness, nasal congestion, sinus pressure and sore throat. ; ; Cardiovascular: Negative for chest pain, palpitations, diaphoresis, dyspnea and peripheral edema. ; ; Respiratory: Negative for cough, wheezing and stridor. ; ; Gastrointestinal: Negative for nausea, vomiting, diarrhea, abdominal pain, blood in stool, hematemesis, jaundice and rectal bleeding.; ; Genitourinary: +dysuria. Negative for flank pain and hematuria. ; ; Genital:  No penile drainage or rash, no testicular pain or swelling, no scrotal rash or swelling. ;; Musculoskeletal: Negative for back pain and neck pain. Negative for swelling and trauma.; ; Skin: Negative for pruritus, rash, abrasions, blisters, bruising and skin lesion.; ; Neuro: Negative for headache, lightheadedness and neck stiffness. Negative for altered level of consciousness , altered mental status, extremity weakness, paresthesias, involuntary movement, seizure and syncope.       Allergies  Review of patient's allergies indicates no known allergies.  Home Medications   Current Outpatient Rx  Name Route Sig Dispense Refill  . CALCIUM ACETATE 667 MG PO CAPS Oral Take 1,334 mg by mouth 3 (three) times daily with meals. BINDER    . VITAMIN D 1000 UNITS PO TABS Oral Take 1,000 Units by mouth daily.    . COLCHICINE 0.6 MG PO TABS  Oral Take 0.6 mg by mouth 2 (two) times daily as needed. For GOUT PAIN    . FEBUXOSTAT 80 MG PO TABS Oral Take 1 tablet by mouth daily as needed. For GOUT/HIGH URIC ACID    . HYDROCODONE-ACETAMINOPHEN 5-325 MG PO TABS Oral Take 1 tablet by mouth every 4 (four) hours as needed for pain. 15 tablet 0  . METOPROLOL TARTRATE 50 MG PO TABS Oral Take 50 mg by mouth daily. For HEART    . OXYCODONE-ACETAMINOPHEN 5-325 MG PO TABS Oral Take 1-2 tablets by mouth every 6 (six) hours as needed.    [name] PREDNISONE 10 MG PO TABS Oral Take 10 mg by mouth daily as needed. For arthritis and swelling    . VARDENAFIL HCL 20 MG PO TABS Oral  Take 20 mg by mouth daily as needed. For sexual intercourse      BP 141/92  Pulse 74  Temp 98.3 F (36.8 C) (Oral)  Resp 18  Ht 5\' 11"  (1.803 m)  Wt 300 lb (136.079 kg)  BMI 41.84 kg/m2  SpO2 98%  Physical Exam 1705: Physical examination:  Nursing notes reviewed; Vital signs and O2 SAT reviewed;  Constitutional: Well developed, Well nourished, Well hydrated, In no acute distress; Head:  Normocephalic, atraumatic; Eyes: EOMI, PERRL, No scleral icterus; ENMT: Mouth and pharynx normal, Mucous membranes moist; Neck: Supple, Full range of motion, No lymphadenopathy; Cardiovascular: Regular rate and rhythm, No murmur, rub, or gallop; Respiratory: Breath sounds clear & equal bilaterally, No rales, rhonchi, wheezes.  Speaking full sentences with ease, Normal respiratory effort/excursion; Chest: Nontender, Movement normal; Abdomen: Soft, Obese.  Nontender, Nondistended, Normal bowel sounds; Genitourinary: No CVA tenderness.  Genital performed with pt permission and male ED RN chaparone present during exam. No erythema underneath lower abd pannus and suprapubic area.  No perineal erythema.  No penile lesions or drainage.  No scrotal erythema, edema or tenderness to palp.  Normal testicular lie.  No testicular tenderness to palp.  +cremasteric reflexes bilat.  No inguinal LAN or palpable masses.;;; Extremities: Pulses normal, No tenderness, 1+ bilat pedal edema, No calf asymmetry.; Neuro: AA&Ox3, Major CN grossly intact.  Speech clear. No gross focal motor or sensory deficits in extremities.; Skin: Color normal, Warm, Dry.   ED Course  Procedures    MDM  MDM Reviewed: previous chart, nursing note and vitals Reviewed previous: labs Interpretation: labs and x-ray   Results for orders placed during the hospital encounter of 12/02/11  URINALYSIS, ROUTINE W REFLEX MICROSCOPIC      Component Value Range   Color, Urine YELLOW  YELLOW   APPearance CLEAR  CLEAR   Specific Gravity, Urine 1.020  1.005 -  1.030   pH 5.5  5.0 - 8.0   Glucose, UA NEGATIVE  NEGATIVE mg/dL   Hgb urine dipstick NEGATIVE  NEGATIVE   Bilirubin Urine NEGATIVE  NEGATIVE   Ketones, ur NEGATIVE  NEGATIVE mg/dL   Protein, ur 100 (*) NEGATIVE mg/dL   Urobilinogen, UA 0.2  0.0 - 1.0 mg/dL   Nitrite NEGATIVE  NEGATIVE   Leukocytes, UA NEGATIVE  NEGATIVE  BASIC METABOLIC PANEL      Component Value Range   Sodium 139  135 - 145 mEq/L   Potassium 4.2  3.5 - 5.1 mEq/L   Chloride 103  96 - 112 mEq/L   CO2 26  19 - 32 mEq/L   Glucose, Bld 78  70 - 99 mg/dL   BUN 43 (*) 6 - 23 mg/dL   Creatinine,  Ser 3.05 (*) 0.50 - 1.35 mg/dL   Calcium 9.6  8.4 - 10.5 mg/dL   GFR calc non Af Amer 23 (*) >90 mL/min   GFR calc Af Amer 27 (*) >90 mL/min  CBC WITH DIFFERENTIAL      Component Value Range   WBC 9.8  4.0 - 10.5 K/uL   RBC 4.59  4.22 - 5.81 MIL/uL   Hemoglobin 12.2 (*) 13.0 - 17.0 g/dL   HCT 38.2 (*) 39.0 - 52.0 %   MCV 83.2  78.0 - 100.0 fL   MCH 26.6  26.0 - 34.0 pg   MCHC 31.9  30.0 - 36.0 g/dL   RDW 18.1 (*) 11.5 - 15.5 %   Platelets 228  150 - 400 K/uL   Neutro Abs 6.2  1.7 - 7.7 K/uL   Lymphs Abs 2.9  0.7 - 4.0 K/uL   Monocytes Absolute 0.7  0.1 - 1.0 K/uL   Eosinophils Absolute 0.1  0.0 - 0.7 K/uL   Basophils Absolute 0.0  0.0 - 0.1 K/uL   Neutrophils Relative 62  43 - 77 %   Lymphocytes Relative 29  12 - 46 %   Monocytes Relative 7  3 - 12 %   Eosinophils Relative 2  0 - 5 %   Basophils Relative 0  0 - 1 %   Band Neutrophils 0  0 - 10 %   Metamyelocytes Relative 0     Myelocytes 0     Promyelocytes Absolute 0     Blasts 0     nRBC 0  0 /100 WBC  URINE MICROSCOPIC-ADD ON      Component Value Range   Squamous Epithelial / LPF RARE  RARE   WBC, UA 0-2  <3 WBC/hpf   RBC / HPF 0-2  <3 RBC/hpf   Bacteria, UA RARE  RARE   Dg Chest 1 View 12/02/2011  *RADIOLOGY REPORT*  Clinical Data: Chest pain  CHEST - 1 VIEW  Comparison: 11/19/2011  Findings: Stable cardiomegaly.  Lungs clear.  No effusion. Relatively  low lung volumes.  Regional bones grossly unremarkable. Obesity.  IMPRESSION:  1.  Stable cardiomegaly.   Original Report Authenticated By: Trecia Rogers, M.D.     Results for JEROMY, BELMONTE (MRN [id number]) as of 12/02/2011 19:45  Ref. Range 08/19/2011 14:45 09/06/2011 13:28 09/23/2011 15:18 11/19/2011 16:55 12/02/2011 18:05  BUN Latest Range: 6-23 mg/dL 59 (H) 61 (H) 38 (H) 37 (H) 43 (H)  Creatinine Latest Range: 0.50-1.35 mg/dL 3.14 (H) 3.49 (H) 3.61 (H) 2.71 (H) 3.05 (H)      1945:  No acute findings on workup today.  No clear criteria to admit.  VS remain stable.  Pt has spent most of his ED visit talking on the telephone in the exam room. Strongly encouraged to f/u with PMD for good continuity of care. Dx and testing d/w pt.  Questions answered.  Verb understanding, agreeable to d/c home with outpt f/u.     Alfonzo Feller, DO 12/04/11 2206

## 2011-12-02 NOTE — ED Notes (Signed)
Back pain, headache, Hurting all over,  "I want to be admitted so they can find out what is wrong with me"  "MY heart goes fast then slow".

## 2011-12-04 LAB — CBC WITH DIFFERENTIAL/PLATELET
Band Neutrophils: 0 % (ref 0–10)
Basophils Relative: 0 % (ref 0–1)
Eosinophils Relative: 2 % (ref 0–5)
Lymphocytes Relative: 29 % (ref 12–46)
Lymphs Abs: 2.9 10*3/uL (ref 0.7–4.0)
MCV: 83.2 fL (ref 78.0–100.0)
Monocytes Absolute: 0.7 10*3/uL (ref 0.1–1.0)
Platelets: 228 10*3/uL (ref 150–400)
Promyelocytes Absolute: 0 %
RDW: 18.1 % — ABNORMAL HIGH (ref 11.5–15.5)
WBC: 9.8 10*3/uL (ref 4.0–10.5)

## 2011-12-04 LAB — URINE CULTURE
Colony Count: NO GROWTH
Culture: NO GROWTH

## 2011-12-24 ENCOUNTER — Encounter (HOSPITAL_COMMUNITY): Payer: Self-pay | Admitting: *Deleted

## 2011-12-24 ENCOUNTER — Emergency Department (HOSPITAL_COMMUNITY)
Admission: EM | Admit: 2011-12-24 | Discharge: 2011-12-24 | Disposition: A | Discharge status: Home / Self Care | Payer: Medicare Other | Attending: Emergency Medicine | Admitting: Emergency Medicine

## 2011-12-24 DIAGNOSIS — Z86718 Personal history of other venous thrombosis and embolism: Secondary | ICD-10-CM | POA: Insufficient documentation

## 2011-12-24 DIAGNOSIS — D649 Anemia, unspecified: Secondary | ICD-10-CM | POA: Insufficient documentation

## 2011-12-24 DIAGNOSIS — R29898 Other symptoms and signs involving the musculoskeletal system: Secondary | ICD-10-CM

## 2011-12-24 DIAGNOSIS — Z79899 Other long term (current) drug therapy: Secondary | ICD-10-CM | POA: Insufficient documentation

## 2011-12-24 DIAGNOSIS — G8929 Other chronic pain: Secondary | ICD-10-CM | POA: Insufficient documentation

## 2011-12-24 DIAGNOSIS — I129 Hypertensive chronic kidney disease with stage 1 through stage 4 chronic kidney disease, or unspecified chronic kidney disease: Secondary | ICD-10-CM | POA: Insufficient documentation

## 2011-12-24 DIAGNOSIS — E119 Type 2 diabetes mellitus without complications: Secondary | ICD-10-CM | POA: Insufficient documentation

## 2011-12-24 DIAGNOSIS — M6281 Muscle weakness (generalized): Secondary | ICD-10-CM | POA: Insufficient documentation

## 2011-12-24 DIAGNOSIS — N400 Enlarged prostate without lower urinary tract symptoms: Secondary | ICD-10-CM | POA: Insufficient documentation

## 2011-12-24 DIAGNOSIS — N184 Chronic kidney disease, stage 4 (severe): Secondary | ICD-10-CM | POA: Insufficient documentation

## 2011-12-24 DIAGNOSIS — M109 Gout, unspecified: Secondary | ICD-10-CM | POA: Insufficient documentation

## 2011-12-24 DIAGNOSIS — M545 Low back pain, unspecified: Secondary | ICD-10-CM | POA: Insufficient documentation

## 2011-12-24 LAB — CBC WITH DIFFERENTIAL/PLATELET
Basophils Absolute: 0 10*3/uL (ref 0.0–0.1)
Eosinophils Relative: 1 % (ref 0–5)
Lymphocytes Relative: 23 % (ref 12–46)
Lymphs Abs: 2.4 10*3/uL (ref 0.7–4.0)
MCV: 83.4 fL (ref 78.0–100.0)
Monocytes Relative: 10 % (ref 3–12)
Neutrophils Relative %: 66 % (ref 43–77)
Platelets: 199 10*3/uL (ref 150–400)
RBC: 4.88 MIL/uL (ref 4.22–5.81)
WBC: 10.5 10*3/uL (ref 4.0–10.5)

## 2011-12-24 LAB — COMPREHENSIVE METABOLIC PANEL
ALT: 10 U/L (ref 0–53)
AST: 24 U/L (ref 0–37)
Albumin: 3.3 g/dL — ABNORMAL LOW (ref 3.5–5.2)
Alkaline Phosphatase: 64 U/L (ref 39–117)
Calcium: 9.3 mg/dL (ref 8.4–10.5)
Potassium: 4.4 mEq/L (ref 3.5–5.1)
Sodium: 139 mEq/L (ref 135–145)
Total Protein: 6.9 g/dL (ref 6.0–8.3)

## 2011-12-24 LAB — URINALYSIS, ROUTINE W REFLEX MICROSCOPIC
Bilirubin Urine: NEGATIVE
Specific Gravity, Urine: 1.025 (ref 1.005–1.030)
Urobilinogen, UA: 0.2 mg/dL (ref 0.0–1.0)
pH: 5.5 (ref 5.0–8.0)

## 2011-12-24 LAB — URINE MICROSCOPIC-ADD ON

## 2011-12-24 MED ORDER — OXYCODONE-ACETAMINOPHEN 5-325 MG PO TABS
2.0000 | ORAL_TABLET | Freq: Once | ORAL | Status: AC
  Administered 2011-12-24: 2 via ORAL
  Filled 2011-12-24: qty 2

## 2011-12-24 NOTE — ED Notes (Signed)
Headache, abd, back, chest pain for 3 weeks.dizzy No NV

## 2011-12-24 NOTE — ED Provider Notes (Signed)
History   This chart was scribed for Leota Jacobsen, MD by Kathreen Cornfield, ED Scribe. The patient was seen in room APA15/APA15 and the patient's care was started at 6:58PM   CSN: [id number]  Arrival date & time 12/24/11  1745   First MD Initiated Contact with Patient 12/24/11 1858      Chief Complaint  Patient presents with  . Abdominal Pain    (Consider location/radiation/quality/duration/timing/severity/associated sxs/prior treatment) The history is provided by the patient. No language interpreter was used.    Jack Irwin is a 46 y.o. male , with a hx of chronic back pain, who presents to the Emergency Department complaining of sudden, progressively worsening, back pain, located at the lower back, onset three weeks ago.  Associated symptoms include loss of sleep, abdominal pain, dysuria, and dizziness. The pt reports he is experiencing back pain throughout the process of urinating. Modifying factors include certain movements and positions which intensify the back pain. The pt has a hx of gout, renal insufficieny, and chronic kidney disease.   The pt denies any hx of kidney stones and fever.  The pt does not smoke or drink alcohol.   PCP is Dr. [name]   Past Medical History  Diagnosis Date  . Hypertension   . Diabetes mellitus   . Gout   . Chronic back pain   . BPH (benign prostatic hyperplasia)   . Anemia   . DVT (deep venous thrombosis)   . Neuropathy   . Decubitus ulcer     of 2nd toes of both feet.  . Edema leg   . Constipation   . Venous (peripheral) insufficiency   . Neuropathy, diabetic   . Physical deconditioning   . Renal insufficiency   . Chronic kidney disease   . Chronic kidney disease (CKD), stage IV (severe)     Past Surgical History  Procedure Date  . None     Family History  Problem Relation Age of Onset  . Diabetes Mother   . Hypertension Mother   . Heart failure Mother   . Hyperlipidemia Mother   . Cancer Father   . Diabetes Father    . Hypertension Father   . Hyperlipidemia Father     History  Substance Use Topics  . Smoking status: Never Smoker   . Smokeless tobacco: Never Used  . Alcohol Use: No      Review of Systems  All other systems reviewed and are negative.    Allergies  Review of patient's allergies indicates no known allergies.  Home Medications   Current Outpatient Rx  Name  Route  Sig  Dispense  Refill  . CALCIUM ACETATE 667 MG PO CAPS   Oral   Take 1,334 mg by mouth 3 (three) times daily with meals. BINDER         . VITAMIN D 1000 UNITS PO TABS   Oral   Take 1,000 Units by mouth daily.         . COLCHICINE 0.6 MG PO TABS   Oral   Take 0.6 mg by mouth 2 (two) times daily as needed. For GOUT PAIN         . FEBUXOSTAT 80 MG PO TABS   Oral   Take 1 tablet by mouth daily as needed. For GOUT/HIGH URIC ACID         . METOPROLOL TARTRATE 50 MG PO TABS   Oral   Take 50 mg by mouth daily. For HEART         .  OXYCODONE-ACETAMINOPHEN 5-325 MG PO TABS   Oral   Take 1-2 tablets by mouth every 6 (six) hours as needed. For pain         . PREDNISONE 10 MG PO TABS   Oral   Take 10 mg by mouth daily as needed. For arthritis and swelling         . VARDENAFIL HCL 20 MG PO TABS   Oral   Take 20 mg by mouth daily as needed. For sexual intercourse           BP 152/103  Pulse 76  Temp 97.6 F (36.4 C) (Oral)  Resp 20  Ht 5\' 11"  (1.803 m)  Wt 300 lb (136.079 kg)  BMI 41.84 kg/m2  SpO2 100%  Physical Exam  Nursing note and vitals reviewed. Constitutional: He is oriented to person, place, and time. He appears well-developed and well-nourished.  Non-toxic appearance. No distress.  HENT:  Head: Normocephalic and atraumatic.  Eyes: Conjunctivae normal, EOM and lids are normal. Pupils are equal, round, and reactive to light.  Neck: Normal range of motion. Neck supple. No tracheal deviation present. No mass present.  Cardiovascular: Normal rate, regular rhythm and normal  heart sounds.  Exam reveals no gallop.   No murmur heard. Pulmonary/Chest: Effort normal and breath sounds normal. No stridor. No respiratory distress. He has no decreased breath sounds. He has no wheezes. He has no rhonchi. He has no rales.  Abdominal: Soft. Normal appearance and bowel sounds are normal. He exhibits no distension. There is no tenderness. There is no rebound and no CVA tenderness.  Musculoskeletal: Normal range of motion. He exhibits no edema and no tenderness.  Neurological: He is alert and oriented to person, place, and time. He has normal strength. No cranial nerve deficit or sensory deficit. GCS eye subscore is 4. GCS verbal subscore is 5. GCS motor subscore is 6.  Skin: Skin is warm and dry. No abrasion and no rash noted.  Psychiatric: He has a normal mood and affect. His speech is normal and behavior is normal.    ED Course  Procedures (including critical care time)  DIAGNOSTIC STUDIES: Oxygen Saturation is 100% on room air, normal by my interpretation.    COORDINATION OF CARE:  7:03 PM- Treatment plan concerning UA and blood work discussed with patient. Pt agrees with treatment.   9:54 PM- Recheck. Treatment plan concerning laboratory results discussed with patient. Pt agrees with treatment.      Results for orders placed during the hospital encounter of 12/24/11  CBC WITH DIFFERENTIAL      Component Value Range   WBC 10.5  4.0 - 10.5 K/uL   RBC 4.88  4.22 - 5.81 MIL/uL   Hemoglobin 12.9 (*) 13.0 - 17.0 g/dL   HCT 40.7  39.0 - 52.0 %   MCV 83.4  78.0 - 100.0 fL   MCH 26.4  26.0 - 34.0 pg   MCHC 31.7  30.0 - 36.0 g/dL   RDW 18.1 (*) 11.5 - 15.5 %   Platelets 199  150 - 400 K/uL   Neutrophils Relative 66  43 - 77 %   Lymphocytes Relative 23  12 - 46 %   Monocytes Relative 10  3 - 12 %   Eosinophils Relative 1  0 - 5 %   Basophils Relative 0  0 - 1 %   Neutro Abs 6.9  1.7 - 7.7 K/uL   Lymphs Abs 2.4  0.7 - 4.0 K/uL   Monocytes  Absolute 1.1 (*) 0.1 -  1.0 K/uL   Eosinophils Absolute 0.1  0.0 - 0.7 K/uL   Basophils Absolute 0.0  0.0 - 0.1 K/uL   RBC Morphology ELLIPTOCYTES     WBC Morphology TOXIC GRANULATION     Smear Review LARGE PLATELETS PRESENT    COMPREHENSIVE METABOLIC PANEL      Component Value Range   Sodium 139  135 - 145 mEq/L   Potassium 4.4  3.5 - 5.1 mEq/L   Chloride 105  96 - 112 mEq/L   CO2 23  19 - 32 mEq/L   Glucose, Bld 74  70 - 99 mg/dL   BUN 53 (*) 6 - 23 mg/dL   Creatinine, Ser 3.20 (*) 0.50 - 1.35 mg/dL   Calcium 9.3  8.4 - 10.5 mg/dL   Total Protein 6.9  6.0 - 8.3 g/dL   Albumin 3.3 (*) 3.5 - 5.2 g/dL   AST 24  0 - 37 U/L   ALT 10  0 - 53 U/L   Alkaline Phosphatase 64  39 - 117 U/L   Total Bilirubin 0.6  0.3 - 1.2 mg/dL   GFR calc non Af Amer 22 (*) >90 mL/min   GFR calc Af Amer 26 (*) >90 mL/min  LIPASE, BLOOD      Component Value Range   Lipase 48  11 - 59 U/L  URINALYSIS, ROUTINE W REFLEX MICROSCOPIC      Component Value Range   Color, Urine YELLOW  YELLOW   APPearance CLEAR  CLEAR   Specific Gravity, Urine 1.025  1.005 - 1.030   pH 5.5  5.0 - 8.0   Glucose, UA NEGATIVE  NEGATIVE mg/dL   Hgb urine dipstick SMALL (*) NEGATIVE   Bilirubin Urine NEGATIVE  NEGATIVE   Ketones, ur NEGATIVE  NEGATIVE mg/dL   Protein, ur 100 (*) NEGATIVE mg/dL   Urobilinogen, UA 0.2  0.0 - 1.0 mg/dL   Nitrite NEGATIVE  NEGATIVE   Leukocytes, UA NEGATIVE  NEGATIVE  URINE MICROSCOPIC-ADD ON      Component Value Range   Squamous Epithelial / LPF RARE  RARE   WBC, UA 0-2  <3 WBC/hpf   RBC / HPF 0-2  <3 RBC/hpf   Bacteria, UA RARE  RARE        No results found.   No diagnosis found.    MDM  Patient's labs reviewed. His renal insufficiency is chronic. He was instructed to followup with Dr.      I personally performed the services described in this documentation, which was scribed in my presence. The recorded information has been reviewed and is accurate.    Leota Jacobsen, MD 12/24/11 2156

## 2011-12-24 NOTE — ED Notes (Signed)
Requests soda and sandwich, advised this is not possible at this point until the physician allows and his test results have returned.

## 2011-12-24 NOTE — ED Notes (Signed)
Again asking for food and drink, advised his results are pending doctors review.

## 2011-12-24 NOTE — ED Notes (Signed)
Lying on stretcher, appears in no acute distress.  States he has been having "these pains," for a while, states over 6 months.  States the pain in his lower back has gotten worse over the last several weeks.

## 2011-12-24 NOTE — ED Notes (Signed)
Constantly asking for food, advised he was not allowed to have anything to eat at this point until the physician allows it.

## 2011-12-26 LAB — URINE CULTURE: Culture: NO GROWTH

## 2012-01-09 ENCOUNTER — Encounter (HOSPITAL_COMMUNITY): Payer: Self-pay | Admitting: *Deleted

## 2012-01-09 ENCOUNTER — Emergency Department (HOSPITAL_COMMUNITY)
Admission: EM | Admit: 2012-01-09 | Discharge: 2012-01-09 | Disposition: A | Discharge status: Home / Self Care | Payer: Medicare Other | Attending: Emergency Medicine | Admitting: Emergency Medicine

## 2012-01-09 DIAGNOSIS — M791 Myalgia, unspecified site: Secondary | ICD-10-CM

## 2012-01-09 DIAGNOSIS — I129 Hypertensive chronic kidney disease with stage 1 through stage 4 chronic kidney disease, or unspecified chronic kidney disease: Secondary | ICD-10-CM | POA: Insufficient documentation

## 2012-01-09 DIAGNOSIS — R42 Dizziness and giddiness: Secondary | ICD-10-CM | POA: Insufficient documentation

## 2012-01-09 DIAGNOSIS — IMO0001 Reserved for inherently not codable concepts without codable children: Secondary | ICD-10-CM | POA: Insufficient documentation

## 2012-01-09 DIAGNOSIS — M549 Dorsalgia, unspecified: Secondary | ICD-10-CM | POA: Insufficient documentation

## 2012-01-09 DIAGNOSIS — Z79899 Other long term (current) drug therapy: Secondary | ICD-10-CM | POA: Insufficient documentation

## 2012-01-09 DIAGNOSIS — E1149 Type 2 diabetes mellitus with other diabetic neurological complication: Secondary | ICD-10-CM | POA: Insufficient documentation

## 2012-01-09 DIAGNOSIS — M542 Cervicalgia: Secondary | ICD-10-CM | POA: Insufficient documentation

## 2012-01-09 DIAGNOSIS — N289 Disorder of kidney and ureter, unspecified: Secondary | ICD-10-CM

## 2012-01-09 DIAGNOSIS — D649 Anemia, unspecified: Secondary | ICD-10-CM | POA: Insufficient documentation

## 2012-01-09 DIAGNOSIS — K5900 Constipation, unspecified: Secondary | ICD-10-CM | POA: Insufficient documentation

## 2012-01-09 DIAGNOSIS — Z7901 Long term (current) use of anticoagulants: Secondary | ICD-10-CM | POA: Insufficient documentation

## 2012-01-09 DIAGNOSIS — Z87448 Personal history of other diseases of urinary system: Secondary | ICD-10-CM | POA: Insufficient documentation

## 2012-01-09 DIAGNOSIS — R609 Edema, unspecified: Secondary | ICD-10-CM

## 2012-01-09 DIAGNOSIS — G8929 Other chronic pain: Secondary | ICD-10-CM | POA: Insufficient documentation

## 2012-01-09 DIAGNOSIS — N184 Chronic kidney disease, stage 4 (severe): Secondary | ICD-10-CM | POA: Insufficient documentation

## 2012-01-09 DIAGNOSIS — E1142 Type 2 diabetes mellitus with diabetic polyneuropathy: Secondary | ICD-10-CM | POA: Insufficient documentation

## 2012-01-09 DIAGNOSIS — Z86718 Personal history of other venous thrombosis and embolism: Secondary | ICD-10-CM | POA: Insufficient documentation

## 2012-01-09 DIAGNOSIS — M109 Gout, unspecified: Secondary | ICD-10-CM | POA: Insufficient documentation

## 2012-01-09 LAB — URINALYSIS, ROUTINE W REFLEX MICROSCOPIC
Glucose, UA: NEGATIVE mg/dL
Ketones, ur: NEGATIVE mg/dL
Leukocytes, UA: NEGATIVE
Nitrite: NEGATIVE
Protein, ur: 100 mg/dL — AB
Urobilinogen, UA: 1 mg/dL (ref 0.0–1.0)

## 2012-01-09 LAB — COMPREHENSIVE METABOLIC PANEL
Alkaline Phosphatase: 67 U/L (ref 39–117)
BUN: 48 mg/dL — ABNORMAL HIGH (ref 6–23)
CO2: 23 mEq/L (ref 19–32)
Chloride: 106 mEq/L (ref 96–112)
Creatinine, Ser: 3.6 mg/dL — ABNORMAL HIGH (ref 0.50–1.35)
GFR calc Af Amer: 22 mL/min — ABNORMAL LOW (ref >90)
GFR calc non Af Amer: 19 mL/min — ABNORMAL LOW (ref >90)
Glucose, Bld: 98 mg/dL (ref 70–99)
Potassium: 4.8 mEq/L (ref 3.5–5.1)
Total Bilirubin: 0.3 mg/dL (ref 0.3–1.2)

## 2012-01-09 LAB — CBC WITH DIFFERENTIAL/PLATELET
HCT: 37.1 % — ABNORMAL LOW (ref 39.0–52.0)
Hemoglobin: 11.8 g/dL — ABNORMAL LOW (ref 13.0–17.0)
Lymphs Abs: 2.4 10*3/uL (ref 0.7–4.0)
MCHC: 31.8 g/dL (ref 30.0–36.0)
Monocytes Absolute: 1.2 10*3/uL — ABNORMAL HIGH (ref 0.1–1.0)
Monocytes Relative: 11 % (ref 3–12)
Neutro Abs: 6.5 10*3/uL (ref 1.7–7.7)
Neutrophils Relative %: 63 % (ref 43–77)
RBC: 4.46 MIL/uL (ref 4.22–5.81)

## 2012-01-09 LAB — URINE MICROSCOPIC-ADD ON

## 2012-01-09 MED ORDER — FUROSEMIDE 40 MG PO TABS: 40.0000 mg | ORAL_TABLET | Freq: Every day | ORAL | Status: DC

## 2012-01-09 MED ORDER — OXYCODONE-ACETAMINOPHEN 5-325 MG PO TABS: 1.0000 | ORAL_TABLET | ORAL | Status: AC | PRN

## 2012-01-09 MED ORDER — OXYCODONE-ACETAMINOPHEN 5-325 MG PO TABS
1.0000 | ORAL_TABLET | Freq: Once | ORAL | Status: AC
  Administered 2012-01-09: 1 via ORAL
  Filled 2012-01-09: qty 1

## 2012-01-09 NOTE — ED Notes (Signed)
Headache for 2 days, back, neck pain, Groin pain,  No NVD,

## 2012-01-09 NOTE — ED Notes (Signed)
Pt on phone and watching TV, NAD is noted at this time. AAx4

## 2012-01-09 NOTE — ED Provider Notes (Signed)
History  This chart was scribed for Jack Fuel, MD by Jack Irwin, ED Scribe. The patient was seen in room APA03/APA03. Patient's care was started at 2126.  CSN: [id number]  Arrival date & time 01/09/12  [id number]   First MD Initiated Contact with Patient 01/09/12 2126      Chief Complaint  Patient presents with  . Headache    The history is provided by the patient. No language interpreter was used.    HPI Comments: Jack Irwin is a 46 y.o. male with a history of HTN, diabetes, chronic stage IV kidney disease, DVT, anemia who presents to the Emergency Department complaining of moderate to severe, constant, non-radiating, dull, upper back, neck and headache pain onset 2 days ago. Patient rates pain as 9/10 and says that it is gradually worsening. There is associated nausea and dizziness. He reports no aggravating or relieving factors. Patient hasn't taken any OTC medications for pain relief. Patient denies chills or diaphoresis. Patient states that he hasn't seen his PCP or urologist for 4-5 months. Patient says that during his last visit with the urologist, he discussed dialysis. Patient's other medical history includes gout, neuropathy, edema of lower extremities, peripheral venous insufficiency. Patient does not smoke.    Past Medical History  Diagnosis Date  . Hypertension   . Diabetes mellitus   . Gout   . Chronic back pain   . BPH (benign prostatic hyperplasia)   . Anemia   . DVT (deep venous thrombosis)   . Neuropathy   . Decubitus ulcer     of 2nd toes of both feet.  . Edema leg   . Constipation   . Venous (peripheral) insufficiency   . Neuropathy, diabetic   . Physical deconditioning   . Renal insufficiency   . Chronic kidney disease   . Chronic kidney disease (CKD), stage IV (severe)     Past Surgical History  Procedure Date  . None     Family History  Problem Relation Age of Onset  . Diabetes Mother   . Hypertension Mother   . Heart failure Mother   .  Hyperlipidemia Mother   . Cancer Father   . Diabetes Father   . Hypertension Father   . Hyperlipidemia Father     History  Substance Use Topics  . Smoking status: Never Smoker   . Smokeless tobacco: Never Used  . Alcohol Use: No      Review of Systems  HENT: Positive for neck pain.   Gastrointestinal: Positive for nausea.  Musculoskeletal: Positive for back pain.  Neurological: Positive for dizziness and headaches.  All other systems reviewed and are negative.    Allergies  Review of patient's allergies indicates no known allergies.  Home Medications   Current Outpatient Rx  Name  Route  Sig  Dispense  Refill  . CALCIUM ACETATE 667 MG PO CAPS   Oral   Take 1,334 mg by mouth 3 (three) times daily with meals. BINDER         . VITAMIN D 1000 UNITS PO TABS   Oral   Take 1,000 Units by mouth daily.         . COLCHICINE 0.6 MG PO TABS   Oral   Take 0.6 mg by mouth 2 (two) times daily as needed. For GOUT PAIN         . FEBUXOSTAT 80 MG PO TABS   Oral   Take 1 tablet by mouth daily as needed. For GOUT/HIGH URIC ACID         .  METOPROLOL TARTRATE 50 MG PO TABS   Oral   Take 50 mg by mouth daily. For HEART         . OXYCODONE-ACETAMINOPHEN 5-325 MG PO TABS   Oral   Take 1-2 tablets by mouth every 6 (six) hours as needed. For pain         . PREDNISONE 10 MG PO TABS   Oral   Take 10 mg by mouth daily as needed. For arthritis and swelling         . VARDENAFIL HCL 20 MG PO TABS   Oral   Take 20 mg by mouth daily as needed. For sexual intercourse           Triage Vitals: BP 140/100  Pulse 82  Temp 97.9 F (36.6 C) (Oral)  Resp 20  Ht 5\' 11"  (1.803 m)  Wt 250 lb (113.399 kg)  BMI 34.87 kg/m2  SpO2 98%  Physical Exam  Nursing note and vitals reviewed. Constitutional: He is oriented to person, place, and time. He appears well-developed and well-nourished. No distress.  HENT:  Head: Normocephalic and atraumatic.  Eyes: Conjunctivae normal  and EOM are normal. Pupils are equal, round, and reactive to light. Right eye exhibits no discharge. Left eye exhibits no discharge.       Fundi are normal.  Neck: Neck supple.  Cardiovascular: Normal rate, regular rhythm and normal heart sounds.   No murmur heard. Pulmonary/Chest: Effort normal and breath sounds normal. No respiratory distress. He has no wheezes. He has no rales.  Abdominal: Soft. He exhibits no distension.  Musculoskeletal: Normal range of motion. He exhibits edema.       1+ pitting edema.  Neurological: He is alert and oriented to person, place, and time.  Skin: Skin is warm and dry. No rash noted.  Psychiatric: He has a normal mood and affect. His behavior is normal.    ED Course  Procedures (including critical care time) DIAGNOSTIC STUDIES: Oxygen Saturation is 98% on room air, normal by my interpretation.    COORDINATION OF CARE: 9:36 PM- Patient informed of current plan for treatment and evaluation and agrees with plan at this time. Medication orders: 1 tablet of Percocet 5-325 mg. Ordered CBC, comprehensive metabolic panel and urinalysis.   Results for orders placed during the hospital encounter of 01/09/12  CBC WITH DIFFERENTIAL      Component Value Range   WBC 10.2  4.0 - 10.5 K/uL   RBC 4.46  4.22 - 5.81 MIL/uL   Hemoglobin 11.8 (*) 13.0 - 17.0 g/dL   HCT 37.1 (*) 39.0 - 52.0 %   MCV 83.2  78.0 - 100.0 fL   MCH 26.5  26.0 - 34.0 pg   MCHC 31.8  30.0 - 36.0 g/dL   RDW 17.6 (*) 11.5 - 15.5 %   Platelets 238  150 - 400 K/uL   Neutrophils Relative 63  43 - 77 %   Neutro Abs 6.5  1.7 - 7.7 K/uL   Lymphocytes Relative 24  12 - 46 %   Lymphs Abs 2.4  0.7 - 4.0 K/uL   Monocytes Relative 11  3 - 12 %   Monocytes Absolute 1.2 (*) 0.1 - 1.0 K/uL   Eosinophils Relative 1  0 - 5 %   Eosinophils Absolute 0.1  0.0 - 0.7 K/uL   Basophils Relative 0  0 - 1 %   Basophils Absolute 0.0  0.0 - 0.1 K/uL  COMPREHENSIVE METABOLIC PANEL  Component Value Range    Sodium 139  135 - 145 mEq/L   Potassium 4.8  3.5 - 5.1 mEq/L   Chloride 106  96 - 112 mEq/L   CO2 23  19 - 32 mEq/L   Glucose, Bld 98  70 - 99 mg/dL   BUN 48 (*) 6 - 23 mg/dL   Creatinine, Ser 3.60 (*) 0.50 - 1.35 mg/dL   Calcium 8.9  8.4 - 10.5 mg/dL   Total Protein 6.7  6.0 - 8.3 g/dL   Albumin 3.0 (*) 3.5 - 5.2 g/dL   AST 22  0 - 37 U/L   ALT 15  0 - 53 U/L   Alkaline Phosphatase 67  39 - 117 U/L   Total Bilirubin 0.3  0.3 - 1.2 mg/dL   GFR calc non Af Amer 19 (*) >90 mL/min   GFR calc Af Amer 22 (*) >90 mL/min  URINALYSIS, ROUTINE W REFLEX MICROSCOPIC      Component Value Range   Color, Urine YELLOW  YELLOW   APPearance CLEAR  CLEAR   Specific Gravity, Urine 1.025  1.005 - 1.030   pH 6.0  5.0 - 8.0   Glucose, UA NEGATIVE  NEGATIVE mg/dL   Hgb urine dipstick SMALL (*) NEGATIVE   Bilirubin Urine NEGATIVE  NEGATIVE   Ketones, ur NEGATIVE  NEGATIVE mg/dL   Protein, ur 100 (*) NEGATIVE mg/dL   Urobilinogen, UA 1.0  0.0 - 1.0 mg/dL   Nitrite NEGATIVE  NEGATIVE   Leukocytes, UA NEGATIVE  NEGATIVE  URINE MICROSCOPIC-ADD ON      Component Value Range   Squamous Epithelial / LPF RARE  RARE   WBC, UA 0-2  <3 WBC/hpf   RBC / HPF 0-2  <3 RBC/hpf   Bacteria, UA RARE  RARE    1. Myalgia   2. Renal insufficiency   3. Peripheral edema       MDM  Generalized aching of uncertain cause. However, on review of his old records, this is been a frequent complaint to his. Peripheral edema which is most likely secondary to his renal insufficiency. Records go back to 2008 and he has always had some degree of renal insufficiency although the baseline has worsened from about 1.8 to 3.2. Given his new complaint of edema, renal function and urinalysis will be rechecked.  Creatinine has gone up to 3.6 and albumin has dropped to 3.0. He continues to have proteinuria at 100 mg/dl. He will be discharged with a prescription for Lasix in the past and this low-salt diet. He is requesting something for  pain he is sent home with a prescription for Percocet. He is to follow up with his nephrologist and with his PCP.      I personally performed the services described in this documentation, which was scribed in my presence. The recorded information has been reviewed and is accurate.      Jack Fuel, MD [id number]

## 2012-01-12 MED FILL — Oxycodone w/ Acetaminophen Tab 5-325 MG: ORAL | Qty: 6 | Status: AC

## 2012-02-05 ENCOUNTER — Encounter (HOSPITAL_COMMUNITY): Payer: Self-pay | Admitting: *Deleted

## 2012-02-05 ENCOUNTER — Emergency Department (HOSPITAL_COMMUNITY)
Admission: EM | Admit: 2012-02-05 | Discharge: 2012-02-05 | Disposition: A | Discharge status: Home / Self Care | Payer: Medicare Other | Attending: Emergency Medicine | Admitting: Emergency Medicine

## 2012-02-05 DIAGNOSIS — N400 Enlarged prostate without lower urinary tract symptoms: Secondary | ICD-10-CM | POA: Insufficient documentation

## 2012-02-05 DIAGNOSIS — R42 Dizziness and giddiness: Secondary | ICD-10-CM | POA: Insufficient documentation

## 2012-02-05 DIAGNOSIS — Z8719 Personal history of other diseases of the digestive system: Secondary | ICD-10-CM | POA: Insufficient documentation

## 2012-02-05 DIAGNOSIS — Z8669 Personal history of other diseases of the nervous system and sense organs: Secondary | ICD-10-CM | POA: Insufficient documentation

## 2012-02-05 DIAGNOSIS — Z79899 Other long term (current) drug therapy: Secondary | ICD-10-CM | POA: Insufficient documentation

## 2012-02-05 DIAGNOSIS — N189 Chronic kidney disease, unspecified: Secondary | ICD-10-CM

## 2012-02-05 DIAGNOSIS — R05 Cough: Secondary | ICD-10-CM | POA: Insufficient documentation

## 2012-02-05 DIAGNOSIS — R109 Unspecified abdominal pain: Secondary | ICD-10-CM | POA: Insufficient documentation

## 2012-02-05 DIAGNOSIS — R110 Nausea: Secondary | ICD-10-CM | POA: Insufficient documentation

## 2012-02-05 DIAGNOSIS — E119 Type 2 diabetes mellitus without complications: Secondary | ICD-10-CM | POA: Insufficient documentation

## 2012-02-05 DIAGNOSIS — N184 Chronic kidney disease, stage 4 (severe): Secondary | ICD-10-CM | POA: Insufficient documentation

## 2012-02-05 DIAGNOSIS — Z86718 Personal history of other venous thrombosis and embolism: Secondary | ICD-10-CM | POA: Insufficient documentation

## 2012-02-05 DIAGNOSIS — I129 Hypertensive chronic kidney disease with stage 1 through stage 4 chronic kidney disease, or unspecified chronic kidney disease: Secondary | ICD-10-CM | POA: Insufficient documentation

## 2012-02-05 DIAGNOSIS — R059 Cough, unspecified: Secondary | ICD-10-CM | POA: Insufficient documentation

## 2012-02-05 DIAGNOSIS — Z862 Personal history of diseases of the blood and blood-forming organs and certain disorders involving the immune mechanism: Secondary | ICD-10-CM | POA: Insufficient documentation

## 2012-02-05 DIAGNOSIS — R0602 Shortness of breath: Secondary | ICD-10-CM | POA: Insufficient documentation

## 2012-02-05 LAB — URINALYSIS, ROUTINE W REFLEX MICROSCOPIC
Bilirubin Urine: NEGATIVE
Glucose, UA: NEGATIVE mg/dL
Ketones, ur: NEGATIVE mg/dL
Leukocytes, UA: NEGATIVE
Nitrite: NEGATIVE
Protein, ur: 100 mg/dL — AB
Specific Gravity, Urine: >1.03 — ABNORMAL HIGH (ref 1.005–1.030)
Urobilinogen, UA: 0.2 mg/dL (ref 0.0–1.0)
pH: 5.5 (ref 5.0–8.0)

## 2012-02-05 LAB — CBC
HCT: 42 % (ref 39.0–52.0)
Hemoglobin: 13.5 g/dL (ref 13.0–17.0)
MCH: 26.5 pg (ref 26.0–34.0)
MCHC: 32.1 g/dL (ref 30.0–36.0)
MCV: 82.5 fL (ref 78.0–100.0)
Platelets: 197 10*3/uL (ref 150–400)
RBC: 5.09 MIL/uL (ref 4.22–5.81)
RDW: 17.2 % — ABNORMAL HIGH (ref 11.5–15.5)
WBC: 4.3 10*3/uL (ref 4.0–10.5)

## 2012-02-05 LAB — BASIC METABOLIC PANEL
BUN: 50 mg/dL — ABNORMAL HIGH (ref 6–23)
CO2: 23 mEq/L (ref 19–32)
Calcium: 8.7 mg/dL (ref 8.4–10.5)
Chloride: 103 mEq/L (ref 96–112)
Creatinine, Ser: 4.49 mg/dL — ABNORMAL HIGH (ref 0.50–1.35)
GFR calc Af Amer: 17 mL/min — ABNORMAL LOW (ref >90)
GFR calc non Af Amer: 15 mL/min — ABNORMAL LOW (ref >90)
Glucose, Bld: 90 mg/dL (ref 70–99)
Potassium: 4.6 mEq/L (ref 3.5–5.1)
Sodium: 137 mEq/L (ref 135–145)

## 2012-02-05 LAB — URINE MICROSCOPIC-ADD ON

## 2012-02-05 LAB — GLUCOSE, CAPILLARY: Glucose-Capillary: 83 mg/dL (ref 70–99)

## 2012-02-05 MED ORDER — SODIUM CHLORIDE 0.9 % IV BOLUS (SEPSIS)
1000.0000 mL | Freq: Once | INTRAVENOUS | Status: AC
  Administered 2012-02-05: 1000 mL via INTRAVENOUS

## 2012-02-05 MED ORDER — ONDANSETRON HCL 4 MG/2ML IJ SOLN
4.0000 mg | Freq: Once | INTRAMUSCULAR | Status: AC
  Administered 2012-02-05: 4 mg via INTRAVENOUS
  Filled 2012-02-05: qty 2

## 2012-02-05 MED ORDER — ONDANSETRON 4 MG PO TBDP: 4.0000 mg | ORAL_TABLET | Freq: Three times a day (TID) | ORAL | Status: DC | PRN

## 2012-02-05 MED ORDER — OXYCODONE-ACETAMINOPHEN 5-325 MG PO TABS: 1.0000 | ORAL_TABLET | ORAL | Status: DC | PRN

## 2012-02-05 MED ORDER — HYDROMORPHONE HCL PF 1 MG/ML IJ SOLN
1.0000 mg | Freq: Once | INTRAMUSCULAR | Status: AC
  Administered 2012-02-05: 1 mg via INTRAVENOUS
  Filled 2012-02-05: qty 1

## 2012-02-05 NOTE — ED Notes (Addendum)
Pt c/o headache, dizziness, abd pain, nausea that started three days ago, states that he is unable to eat due to nausea, pt states that he has not checked his blood sugar in over a month

## 2012-02-05 NOTE — ED Notes (Signed)
Checked pt/s CBG

## 2012-02-05 NOTE — ED Provider Notes (Signed)
History    This chart was scribed for Virgel Manifold, MD, MD by Rhae Lerner, ED Scribe. The patient was seen in room APA09 and the patient's care was started at 11:05 AM.   CSN: [id number]  Arrival date & time 02/05/12  1002      Chief Complaint  Patient presents with  . Abdominal Pain  . Nausea    (Consider location/radiation/quality/duration/timing/severity/associated sxs/prior treatment) Patient is a 47 y.o. male presenting with abdominal pain. The history is provided by the patient. No language interpreter was used.  Abdominal Pain The primary symptoms of the illness include abdominal pain, shortness of breath and nausea. The current episode started 2 days ago. The onset of the illness was gradual. The problem has not changed since onset. The abdominal pain does not radiate.   ZAKARIYYA CHILSON is a 47 y.o. male with hx of chronic kidney disease, DM, HTN and DVT who presents to the Emergency Department complaining of constant, nausea and abdominal pain onset 2 days ago. Pt reports that he has frontal headache onset 2 days ago. He denies diarrhea and reports that he has constipation. He reports having SOB, cough and dizziness after episodes of coughing. He states he has had sick contact. He denies vomiting, hx of lung conditions and abdominal surgeries. Pt reports that he saw nephrologist 1 month ago with normal evaluation.   Past Medical History  Diagnosis Date  . Hypertension   . Diabetes mellitus   . Gout   . Chronic back pain   . BPH (benign prostatic hyperplasia)   . Anemia   . DVT (deep venous thrombosis)   . Neuropathy   . Decubitus ulcer     of 2nd toes of both feet.  . Edema leg   . Constipation   . Venous (peripheral) insufficiency   . Neuropathy, diabetic   . Physical deconditioning   . Renal insufficiency   . Chronic kidney disease   . Chronic kidney disease (CKD), stage IV (severe)     Past Surgical History  Procedure Date  . None     Family History    Problem Relation Age of Onset  . Diabetes Mother   . Hypertension Mother   . Heart failure Mother   . Hyperlipidemia Mother   . Cancer Father   . Diabetes Father   . Hypertension Father   . Hyperlipidemia Father     History  Substance Use Topics  . Smoking status: Never Smoker   . Smokeless tobacco: Never Used  . Alcohol Use: No      Review of Systems  Respiratory: Positive for cough and shortness of breath.   Gastrointestinal: Positive for nausea and abdominal pain.  Neurological: Positive for dizziness.  All other systems reviewed and are negative.    Allergies  Review of patient's allergies indicates no known allergies.  Home Medications   Current Outpatient Rx  Name  Route  Sig  Dispense  Refill  . CALCIUM ACETATE 667 MG PO CAPS   Oral   Take 1,334 mg by mouth 3 (three) times daily with meals. BINDER         . VITAMIN D 1000 UNITS PO TABS   Oral   Take 1,000 Units by mouth daily.         . COLCHICINE 0.6 MG PO TABS   Oral   Take 0.6 mg by mouth 2 (two) times daily as needed. For GOUT PAIN         . FEBUXOSTAT  80 MG PO TABS   Oral   Take 1 tablet by mouth daily as needed. For GOUT/HIGH URIC ACID         . FUROSEMIDE 40 MG PO TABS   Oral   Take 1 tablet (40 mg total) by mouth daily.   5 tablet   0   . METOPROLOL TARTRATE 50 MG PO TABS   Oral   Take 50 mg by mouth daily. For HEART         . OXYCODONE-ACETAMINOPHEN 5-325 MG PO TABS   Oral   Take 1-2 tablets by mouth every 6 (six) hours as needed. For pain         . PREDNISONE 10 MG PO TABS   Oral   Take 10 mg by mouth daily as needed. For arthritis and swelling         . VARDENAFIL HCL 20 MG PO TABS   Oral   Take 20 mg by mouth daily as needed. For sexual intercourse           BP 118/84  Pulse 93  Temp 98.3 F (36.8 C)  Resp 16  Ht 5\' 11"  (1.803 m)  Wt 300 lb (136.079 kg)  BMI 41.84 kg/m2  SpO2 96%  Physical Exam  Nursing note and vitals reviewed. Constitutional:  He appears well-developed and well-nourished. No distress.  HENT:  Head: Normocephalic and atraumatic.  Eyes: Conjunctivae normal are normal. Right eye exhibits no discharge. Left eye exhibits no discharge.  Neck: Neck supple.  Cardiovascular: Normal rate, regular rhythm and normal heart sounds.  Exam reveals no gallop and no friction rub.   No murmur heard. Pulmonary/Chest: Effort normal and breath sounds normal. No respiratory distress.  Abdominal: Soft. He exhibits no distension. There is no tenderness.  Musculoskeletal: He exhibits no edema and no tenderness.  Neurological: He is alert.  Skin: Skin is warm and dry.  Psychiatric: He has a normal mood and affect. His behavior is normal. Thought content normal.    ED Course  Procedures (including critical care time) DIAGNOSTIC STUDIES: Oxygen Saturation is 96% on room air, normal by my interpretation.    COORDINATION OF CARE: 11:09 AM Discussed ED treatment with pt     Labs Reviewed - No data to display No results found.   1. Abdominal pain   2. Nausea   3. CKD (chronic kidney disease)       MDM  46yM with abdominal pain. Benign exam. Worsening of renal function and continued proteinuria. Hx of CKD. 'lytes ok. Do not feel that needs emergent intervention but discussed need for close fu with nephrologist/pcp.       I personally preformed the services scribed in my presence. The recorded information has been reviewed is accurate. Virgel Manifold, MD.   Virgel Manifold, MD 02/06/12 [phone]

## 2012-02-05 NOTE — ED Notes (Signed)
Pt called to say Insurance would not cover Zofran.  Dr. Eliane Decree gave permission for Gibson Ramp, Rn to call in Phenergan 25 tab. Take i tab QID po, prn. #10. Called to [employer].

## 2012-02-19 ENCOUNTER — Encounter (HOSPITAL_COMMUNITY): Payer: Self-pay | Admitting: *Deleted

## 2012-02-19 ENCOUNTER — Emergency Department (HOSPITAL_COMMUNITY)
Admission: EM | Admit: 2012-02-19 | Discharge: 2012-02-19 | Disposition: A | Discharge status: Home / Self Care | Payer: Medicare Other | Attending: Emergency Medicine | Admitting: Emergency Medicine

## 2012-02-19 DIAGNOSIS — Z862 Personal history of diseases of the blood and blood-forming organs and certain disorders involving the immune mechanism: Secondary | ICD-10-CM | POA: Insufficient documentation

## 2012-02-19 DIAGNOSIS — G8929 Other chronic pain: Secondary | ICD-10-CM | POA: Insufficient documentation

## 2012-02-19 DIAGNOSIS — N189 Chronic kidney disease, unspecified: Secondary | ICD-10-CM | POA: Insufficient documentation

## 2012-02-19 DIAGNOSIS — R42 Dizziness and giddiness: Secondary | ICD-10-CM | POA: Insufficient documentation

## 2012-02-19 DIAGNOSIS — E1149 Type 2 diabetes mellitus with other diabetic neurological complication: Secondary | ICD-10-CM | POA: Insufficient documentation

## 2012-02-19 DIAGNOSIS — G43909 Migraine, unspecified, not intractable, without status migrainosus: Secondary | ICD-10-CM | POA: Insufficient documentation

## 2012-02-19 DIAGNOSIS — N390 Urinary tract infection, site not specified: Secondary | ICD-10-CM | POA: Insufficient documentation

## 2012-02-19 DIAGNOSIS — E1142 Type 2 diabetes mellitus with diabetic polyneuropathy: Secondary | ICD-10-CM | POA: Insufficient documentation

## 2012-02-19 DIAGNOSIS — Z872 Personal history of diseases of the skin and subcutaneous tissue: Secondary | ICD-10-CM | POA: Insufficient documentation

## 2012-02-19 DIAGNOSIS — Z79899 Other long term (current) drug therapy: Secondary | ICD-10-CM | POA: Insufficient documentation

## 2012-02-19 DIAGNOSIS — E119 Type 2 diabetes mellitus without complications: Secondary | ICD-10-CM | POA: Insufficient documentation

## 2012-02-19 DIAGNOSIS — Z8679 Personal history of other diseases of the circulatory system: Secondary | ICD-10-CM | POA: Insufficient documentation

## 2012-02-19 DIAGNOSIS — M109 Gout, unspecified: Secondary | ICD-10-CM | POA: Insufficient documentation

## 2012-02-19 DIAGNOSIS — I129 Hypertensive chronic kidney disease with stage 1 through stage 4 chronic kidney disease, or unspecified chronic kidney disease: Secondary | ICD-10-CM | POA: Insufficient documentation

## 2012-02-19 DIAGNOSIS — R300 Dysuria: Secondary | ICD-10-CM | POA: Insufficient documentation

## 2012-02-19 DIAGNOSIS — IMO0002 Reserved for concepts with insufficient information to code with codable children: Secondary | ICD-10-CM | POA: Insufficient documentation

## 2012-02-19 DIAGNOSIS — M549 Dorsalgia, unspecified: Secondary | ICD-10-CM | POA: Insufficient documentation

## 2012-02-19 DIAGNOSIS — Z87448 Personal history of other diseases of urinary system: Secondary | ICD-10-CM | POA: Insufficient documentation

## 2012-02-19 DIAGNOSIS — R51 Headache: Secondary | ICD-10-CM | POA: Insufficient documentation

## 2012-02-19 DIAGNOSIS — Z86718 Personal history of other venous thrombosis and embolism: Secondary | ICD-10-CM | POA: Insufficient documentation

## 2012-02-19 LAB — URINALYSIS, ROUTINE W REFLEX MICROSCOPIC
Bilirubin Urine: NEGATIVE
Ketones, ur: NEGATIVE mg/dL
Nitrite: NEGATIVE
Protein, ur: 100 mg/dL — AB
Specific Gravity, Urine: 1.02 (ref 1.005–1.030)
Urobilinogen, UA: 0.2 mg/dL (ref 0.0–1.0)

## 2012-02-19 LAB — URINE MICROSCOPIC-ADD ON

## 2012-02-19 MED ORDER — CIPROFLOXACIN HCL 500 MG PO TABS: 500.0000 mg | ORAL_TABLET | Freq: Two times a day (BID) | ORAL | Status: DC

## 2012-02-19 MED ORDER — IBUPROFEN 800 MG PO TABS
800.0000 mg | ORAL_TABLET | Freq: Once | ORAL | Status: AC
  Administered 2012-02-19: 800 mg via ORAL
  Filled 2012-02-19: qty 1

## 2012-02-19 MED ORDER — ACETAMINOPHEN 500 MG PO TABS
1000.0000 mg | ORAL_TABLET | Freq: Once | ORAL | Status: AC
  Administered 2012-02-19: 1000 mg via ORAL
  Filled 2012-02-19: qty 2

## 2012-02-19 MED ORDER — CLONIDINE HCL 0.1 MG PO TABS
0.1000 mg | ORAL_TABLET | Freq: Once | ORAL | Status: AC
  Administered 2012-02-19: 0.1 mg via ORAL
  Filled 2012-02-19: qty 1

## 2012-02-19 MED ORDER — CIPROFLOXACIN HCL 250 MG PO TABS
500.0000 mg | ORAL_TABLET | Freq: Once | ORAL | Status: AC
  Administered 2012-02-19: 500 mg via ORAL
  Filled 2012-02-19: qty 2

## 2012-02-19 NOTE — ED Notes (Signed)
Pt lying in bed, no acute distress, asking for something to eat.

## 2012-02-19 NOTE — ED Notes (Signed)
Pt alert & oriented x4. Patient given discharge instructions, paperwork & prescription(s). Patient instructed to stop at the registration desk to finish any additional paperwork. Patient verbalized understanding. Pt left department w/ no further questions.

## 2012-02-19 NOTE — ED Notes (Signed)
Per Dr. Jonathon Jordan approval - meal given.  Patient eating with no signs of distress

## 2012-02-19 NOTE — ED Provider Notes (Signed)
History     CSN: [id number]  Arrival date & time 02/19/12  1441   First MD Initiated Contact with Patient 02/19/12 1704      Chief Complaint  Patient presents with  . Abdominal Pain  . Migraine    (Consider location/radiation/quality/duration/timing/severity/associated sxs/prior treatment) HPI.....dysuria, suprapubic discomfort, headache, dizziness for 2 days.  Nothing makes symptoms better or worse. Severity is mild. No radiation of pain. No other associated symptoms.  Past Medical History  Diagnosis Date  . Hypertension   . Diabetes mellitus   . Gout   . Chronic back pain   . BPH (benign prostatic hyperplasia)   . Anemia   . DVT (deep venous thrombosis)   . Neuropathy   . Decubitus ulcer     of 2nd toes of both feet.  . Edema leg   . Constipation   . Venous (peripheral) insufficiency   . Neuropathy, diabetic   . Physical deconditioning   . Renal insufficiency   . Chronic kidney disease   . Chronic kidney disease (CKD), stage IV (severe)     Past Surgical History  Procedure Date  . None     Family History  Problem Relation Age of Onset  . Diabetes Mother   . Hypertension Mother   . Heart failure Mother   . Hyperlipidemia Mother   . Cancer Father   . Diabetes Father   . Hypertension Father   . Hyperlipidemia Father     History  Substance Use Topics  . Smoking status: Never Smoker   . Smokeless tobacco: Never Used  . Alcohol Use: No      Review of Systems  All other systems reviewed and are negative.    Allergies  Review of patient's allergies indicates no known allergies.  Home Medications   Current Outpatient Rx  Name  Route  Sig  Dispense  Refill  . CALCIUM ACETATE 667 MG PO CAPS   Oral   Take 1,334 mg by mouth 3 (three) times daily with meals. BINDER         . VITAMIN D 1000 UNITS PO TABS   Oral   Take 1,000 Units by mouth daily.         . COLCHICINE 0.6 MG PO TABS   Oral   Take 0.6 mg by mouth 2 (two) times daily as  needed. For GOUT PAIN         . FEBUXOSTAT 80 MG PO TABS   Oral   Take 1 tablet by mouth daily as needed. For GOUT/HIGH URIC ACID         . FUROSEMIDE 40 MG PO TABS   Oral   Take 1 tablet (40 mg total) by mouth daily.   5 tablet   0   . METOPROLOL TARTRATE 50 MG PO TABS   Oral   Take 50 mg by mouth daily. For HEART         . ONDANSETRON 4 MG PO TBDP   Oral   Take 1 tablet (4 mg total) by mouth every 8 (eight) hours as needed for nausea.   12 tablet   0   . OXYCODONE-ACETAMINOPHEN 5-325 MG PO TABS   Oral   Take 1-2 tablets by mouth every 4 (four) hours as needed for pain.   10 tablet   0   . PREDNISONE 10 MG PO TABS   Oral   Take 10 mg by mouth daily as needed. For arthritis and swelling         .  VARDENAFIL HCL 20 MG PO TABS   Oral   Take 20 mg by mouth daily as needed. For sexual intercourse         . CIPROFLOXACIN HCL 500 MG PO TABS   Oral   Take 1 tablet (500 mg total) by mouth 2 (two) times daily. One po bid x 7 days   14 tablet   0     BP 157/95  Pulse 85  Temp 97.6 F (36.4 C)  Resp 22  SpO2 97%  Physical Exam  Nursing note and vitals reviewed. Constitutional: He is oriented to person, place, and time. He appears well-developed and well-nourished.  HENT:  Head: Normocephalic and atraumatic.  Eyes: Conjunctivae normal and EOM are normal. Pupils are equal, round, and reactive to light.  Neck: Normal range of motion. Neck supple.  Cardiovascular: Normal rate, regular rhythm and normal heart sounds.   Pulmonary/Chest: Effort normal and breath sounds normal.  Abdominal: Soft. Bowel sounds are normal.       Slight suprapubic tenderness  Musculoskeletal: Normal range of motion.  Neurological: He is alert and oriented to person, place, and time.  Skin: Skin is warm and dry.  Psychiatric: He has a normal mood and affect.    ED Course  Procedures (including critical care time)  Labs Reviewed  URINALYSIS, ROUTINE W REFLEX MICROSCOPIC -  Abnormal; Notable for the following:    Hgb urine dipstick TRACE (*)     Protein, ur 100 (*)     All other components within normal limits  URINE MICROSCOPIC-ADD ON  URINE CULTURE   No results found.   1. Urinary tract infection       MDM  Patient appears to be his normal. Slight suprapubic tenderness. Urinalysis shows minor infection. Rx Cipro for one week. Urine culture.        Nat Christen, MD 02/19/12 1919

## 2012-02-19 NOTE — ED Notes (Signed)
No answer in triage,

## 2012-02-19 NOTE — ED Notes (Signed)
No answer in waiting room 

## 2012-02-19 NOTE — ED Notes (Signed)
Pt c/o headache, abd pain, runny nose, dizzy, nausea, fever, chills  that started two days ago,

## 2012-02-21 LAB — URINE CULTURE
Colony Count: NO GROWTH
Culture: NO GROWTH

## 2012-02-24 ENCOUNTER — Encounter (HOSPITAL_COMMUNITY): Payer: Self-pay | Admitting: Emergency Medicine

## 2012-02-24 ENCOUNTER — Emergency Department (HOSPITAL_COMMUNITY)
Admission: EM | Admit: 2012-02-24 | Discharge: 2012-02-24 | Disposition: A | Discharge status: Home / Self Care | Payer: Medicare Other | Attending: Emergency Medicine | Admitting: Emergency Medicine

## 2012-02-24 ENCOUNTER — Emergency Department (HOSPITAL_COMMUNITY): Payer: Medicare Other

## 2012-02-24 DIAGNOSIS — E1149 Type 2 diabetes mellitus with other diabetic neurological complication: Secondary | ICD-10-CM | POA: Insufficient documentation

## 2012-02-24 DIAGNOSIS — M109 Gout, unspecified: Secondary | ICD-10-CM | POA: Insufficient documentation

## 2012-02-24 DIAGNOSIS — M549 Dorsalgia, unspecified: Secondary | ICD-10-CM | POA: Insufficient documentation

## 2012-02-24 DIAGNOSIS — R197 Diarrhea, unspecified: Secondary | ICD-10-CM | POA: Insufficient documentation

## 2012-02-24 DIAGNOSIS — Z79899 Other long term (current) drug therapy: Secondary | ICD-10-CM | POA: Insufficient documentation

## 2012-02-24 DIAGNOSIS — R1084 Generalized abdominal pain: Secondary | ICD-10-CM | POA: Insufficient documentation

## 2012-02-24 DIAGNOSIS — Z8739 Personal history of other diseases of the musculoskeletal system and connective tissue: Secondary | ICD-10-CM | POA: Insufficient documentation

## 2012-02-24 DIAGNOSIS — E1142 Type 2 diabetes mellitus with diabetic polyneuropathy: Secondary | ICD-10-CM | POA: Insufficient documentation

## 2012-02-24 DIAGNOSIS — Z87448 Personal history of other diseases of urinary system: Secondary | ICD-10-CM | POA: Insufficient documentation

## 2012-02-24 DIAGNOSIS — Z862 Personal history of diseases of the blood and blood-forming organs and certain disorders involving the immune mechanism: Secondary | ICD-10-CM | POA: Insufficient documentation

## 2012-02-24 DIAGNOSIS — N184 Chronic kidney disease, stage 4 (severe): Secondary | ICD-10-CM | POA: Insufficient documentation

## 2012-02-24 DIAGNOSIS — R6883 Chills (without fever): Secondary | ICD-10-CM | POA: Insufficient documentation

## 2012-02-24 DIAGNOSIS — R42 Dizziness and giddiness: Secondary | ICD-10-CM | POA: Insufficient documentation

## 2012-02-24 DIAGNOSIS — I129 Hypertensive chronic kidney disease with stage 1 through stage 4 chronic kidney disease, or unspecified chronic kidney disease: Secondary | ICD-10-CM | POA: Insufficient documentation

## 2012-02-24 DIAGNOSIS — Z86718 Personal history of other venous thrombosis and embolism: Secondary | ICD-10-CM | POA: Insufficient documentation

## 2012-02-24 DIAGNOSIS — Z8679 Personal history of other diseases of the circulatory system: Secondary | ICD-10-CM | POA: Insufficient documentation

## 2012-02-24 DIAGNOSIS — R109 Unspecified abdominal pain: Secondary | ICD-10-CM

## 2012-02-24 DIAGNOSIS — R112 Nausea with vomiting, unspecified: Secondary | ICD-10-CM | POA: Insufficient documentation

## 2012-02-24 DIAGNOSIS — IMO0002 Reserved for concepts with insufficient information to code with codable children: Secondary | ICD-10-CM | POA: Insufficient documentation

## 2012-02-24 DIAGNOSIS — G8929 Other chronic pain: Secondary | ICD-10-CM | POA: Insufficient documentation

## 2012-02-24 DIAGNOSIS — Z87440 Personal history of urinary (tract) infections: Secondary | ICD-10-CM | POA: Insufficient documentation

## 2012-02-24 LAB — COMPREHENSIVE METABOLIC PANEL
AST: 35 U/L (ref 0–37)
BUN: 55 mg/dL — ABNORMAL HIGH (ref 6–23)
CO2: 22 mEq/L (ref 19–32)
Calcium: 9.3 mg/dL (ref 8.4–10.5)
Chloride: 102 mEq/L (ref 96–112)
Creatinine, Ser: 4.2 mg/dL — ABNORMAL HIGH (ref 0.50–1.35)
GFR calc non Af Amer: 16 mL/min — ABNORMAL LOW (ref >90)
Total Bilirubin: 0.4 mg/dL (ref 0.3–1.2)

## 2012-02-24 LAB — CBC WITH DIFFERENTIAL/PLATELET
Basophils Absolute: 0 10*3/uL (ref 0.0–0.1)
Basophils Relative: 0 % (ref 0–1)
Eosinophils Relative: 2 % (ref 0–5)
HCT: 37 % — ABNORMAL LOW (ref 39.0–52.0)
Hemoglobin: 11.9 g/dL — ABNORMAL LOW (ref 13.0–17.0)
Lymphocytes Relative: 21 % (ref 12–46)
MCHC: 32.2 g/dL (ref 30.0–36.0)
MCV: 81.9 fL (ref 78.0–100.0)
Monocytes Absolute: 0.7 10*3/uL (ref 0.1–1.0)
Monocytes Relative: 8 % (ref 3–12)
RDW: 17.7 % — ABNORMAL HIGH (ref 11.5–15.5)

## 2012-02-24 LAB — URINALYSIS, ROUTINE W REFLEX MICROSCOPIC
Bilirubin Urine: NEGATIVE
Glucose, UA: NEGATIVE mg/dL
Protein, ur: 100 mg/dL — AB
Urobilinogen, UA: 0.2 mg/dL (ref 0.0–1.0)

## 2012-02-24 MED ORDER — SODIUM CHLORIDE 0.9 % IV BOLUS (SEPSIS)
500.0000 mL | Freq: Once | INTRAVENOUS | Status: AC
  Administered 2012-02-24: 500 mL via INTRAVENOUS

## 2012-02-24 MED ORDER — HYOSCYAMINE SULFATE 0.125 MG PO TABS: 0.1250 mg | ORAL_TABLET | ORAL | Status: DC | PRN

## 2012-02-24 NOTE — ED Provider Notes (Signed)
History   This chart was scribed for Maudry Diego, MD by Hampton Abbot, ED Scribe. This patient was seen in room APA10/APA10 and the patient's care was started at 9:52 AM    CSN: [id number]  Arrival date & time 02/24/12  0907   First MD Initiated Contact with Patient 02/24/12 [phone]      Chief Complaint  Patient presents with  . Abdominal Pain     The history is provided by the patient. No language interpreter was used.   Jack Irwin is a 47 y.o. male with h/o HTN, DM, gout, BPH, DVT, CKD who presents to the Emergency Department complaining of 2-3 days of constant, non-changing, mild, aching, non-radiating abdominal pain with associated nausea, multiple episodes of non-bloody, non-bilious emesis and non-bloody diarrhea, room-spinning light-headedness and dizziness, and chills.  Pt denies fever.  Pt states he was treated for UTI last week.  Pt denies tobacco and alcohol use.   Past Medical History  Diagnosis Date  . Hypertension   . Diabetes mellitus   . Gout   . Chronic back pain   . BPH (benign prostatic hyperplasia)   . Anemia   . DVT (deep venous thrombosis)   . Neuropathy   . Decubitus ulcer     of 2nd toes of both feet.  . Edema leg   . Constipation   . Venous (peripheral) insufficiency   . Neuropathy, diabetic   . Physical deconditioning   . Renal insufficiency   . Chronic kidney disease   . Chronic kidney disease (CKD), stage IV (severe)     Past Surgical History  Procedure Date  . None     Family History  Problem Relation Age of Onset  . Diabetes Mother   . Hypertension Mother   . Heart failure Mother   . Hyperlipidemia Mother   . Cancer Father   . Diabetes Father   . Hypertension Father   . Hyperlipidemia Father     History  Substance Use Topics  . Smoking status: Never Smoker   . Smokeless tobacco: Never Used  . Alcohol Use: No      Review of Systems  Constitutional: Positive for chills.  HENT: Positive for rhinorrhea. Negative for  congestion, sinus pressure and ear discharge.   Eyes: Negative for discharge.  Respiratory: Negative for cough.   Cardiovascular: Negative for chest pain.  Gastrointestinal: Positive for nausea, vomiting, abdominal pain and diarrhea.  Genitourinary: Negative for frequency and hematuria.  Musculoskeletal: Negative for myalgias.  Skin: Negative for rash.  Neurological: Positive for dizziness and light-headedness. Negative for seizures and headaches.  Hematological: Negative.   Psychiatric/Behavioral: Negative for hallucinations.    Allergies  Review of patient's allergies indicates no known allergies.  Home Medications   Current Outpatient Rx  Name  Route  Sig  Dispense  Refill  . CALCIUM ACETATE 667 MG PO CAPS   Oral   Take 1,334 mg by mouth 3 (three) times daily with meals. BINDER         . VITAMIN D 1000 UNITS PO TABS   Oral   Take 1,000 Units by mouth daily.         [name] CIPROFLOXACIN HCL 500 MG PO TABS   Oral   Take 1 tablet (500 mg total) by mouth 2 (two) times daily. One po bid x 7 days   14 tablet   0   . COLCHICINE 0.6 MG PO TABS   Oral   Take 0.6 mg by mouth  2 (two) times daily as needed. For GOUT PAIN         . FEBUXOSTAT 80 MG PO TABS   Oral   Take 1 tablet by mouth daily as needed. For GOUT/HIGH URIC ACID         . FUROSEMIDE 40 MG PO TABS   Oral   Take 1 tablet (40 mg total) by mouth daily.   5 tablet   0   . METOPROLOL TARTRATE 50 MG PO TABS   Oral   Take 50 mg by mouth daily. For HEART         . ONDANSETRON 4 MG PO TBDP   Oral   Take 1 tablet (4 mg total) by mouth every 8 (eight) hours as needed for nausea.   12 tablet   0   . OXYCODONE-ACETAMINOPHEN 5-325 MG PO TABS   Oral   Take 1-2 tablets by mouth every 4 (four) hours as needed for pain.   10 tablet   0   . PREDNISONE 10 MG PO TABS   Oral   Take 10 mg by mouth daily as needed. For arthritis and swelling         . VARDENAFIL HCL 20 MG PO TABS   Oral   Take 20 mg by mouth  daily as needed. For sexual intercourse           BP 134/97  Pulse 100  Temp 97.6 F (36.4 C) (Oral)  Resp 18  SpO2 99%  Physical Exam  Nursing note and vitals reviewed. Constitutional: He is oriented to person, place, and time. He appears well-developed.  HENT:  Head: Normocephalic and atraumatic.  Eyes: Conjunctivae normal and EOM are normal. No scleral icterus.  Neck: Neck supple. No thyromegaly present.  Cardiovascular: Normal rate and regular rhythm.  Exam reveals no gallop and no friction rub.   No murmur heard. Pulmonary/Chest: No stridor. He has no wheezes. He has no rales. He exhibits no tenderness.  Abdominal: He exhibits no distension. There is tenderness. There is no rebound.       Mild diffuse tenderness throughout abdomen.  Musculoskeletal: Normal range of motion. He exhibits no edema.       Bilateral distal feet tender to palpation, very dry skin over bilateral distal feet.  Lymphadenopathy:    He has no cervical adenopathy.  Neurological: He is oriented to person, place, and time. Coordination normal.  Skin: No rash noted. No erythema.  Psychiatric: He has a normal mood and affect. His behavior is normal.    ED Course  Procedures (including critical care time) DIAGNOSTIC STUDIES: Oxygen Saturation is 99% on room air, normal by my interpretation.    COORDINATION OF CARE: 9:57 AM- Patient informed of clinical course, understands medical decision-making process, and agrees with plan.  Ordered IM fluids, CBC, c-met, and acute abdominal w/ chest XR.  Results for orders placed during the hospital encounter of 02/24/12  GLUCOSE, CAPILLARY      Component Value Range   Glucose-Capillary 96  70 - 99 mg/dL  URINALYSIS, ROUTINE W REFLEX MICROSCOPIC      Component Value Range   Color, Urine YELLOW  YELLOW   APPearance CLEAR  CLEAR   Specific Gravity, Urine 1.020  1.005 - 1.030   pH 5.5  5.0 - 8.0   Glucose, UA NEGATIVE  NEGATIVE mg/dL   Hgb urine dipstick TRACE  (*) NEGATIVE   Bilirubin Urine NEGATIVE  NEGATIVE   Ketones, ur NEGATIVE  NEGATIVE mg/dL  Protein, ur 100 (*) NEGATIVE mg/dL   Urobilinogen, UA 0.2  0.0 - 1.0 mg/dL   Nitrite NEGATIVE  NEGATIVE   Leukocytes, UA NEGATIVE  NEGATIVE  URINE MICROSCOPIC-ADD ON      Component Value Range   RBC / HPF 0-2  <3 RBC/hpf  CBC WITH DIFFERENTIAL      Component Value Range   WBC 8.8  4.0 - 10.5 K/uL   RBC 4.52  4.22 - 5.81 MIL/uL   Hemoglobin 11.9 (*) 13.0 - 17.0 g/dL   HCT 37.0 (*) 39.0 - 52.0 %   MCV 81.9  78.0 - 100.0 fL   MCH 26.3  26.0 - 34.0 pg   MCHC 32.2  30.0 - 36.0 g/dL   RDW 17.7 (*) 11.5 - 15.5 %   Platelets 252  150 - 400 K/uL   Neutrophils Relative 69  43 - 77 %   Neutro Abs 6.1  1.7 - 7.7 K/uL   Lymphocytes Relative 21  12 - 46 %   Lymphs Abs 1.9  0.7 - 4.0 K/uL   Monocytes Relative 8  3 - 12 %   Monocytes Absolute 0.7  0.1 - 1.0 K/uL   Eosinophils Relative 2  0 - 5 %   Eosinophils Absolute 0.2  0.0 - 0.7 K/uL   Basophils Relative 0  0 - 1 %   Basophils Absolute 0.0  0.0 - 0.1 K/uL  COMPREHENSIVE METABOLIC PANEL      Component Value Range   Sodium 136  135 - 145 mEq/L   Potassium 4.4  3.5 - 5.1 mEq/L   Chloride 102  96 - 112 mEq/L   CO2 22  19 - 32 mEq/L   Glucose, Bld 102 (*) 70 - 99 mg/dL   BUN 55 (*) 6 - 23 mg/dL   Creatinine, Ser 4.20 (*) 0.50 - 1.35 mg/dL   Calcium 9.3  8.4 - 10.5 mg/dL   Total Protein 7.7  6.0 - 8.3 g/dL   Albumin 3.4 (*) 3.5 - 5.2 g/dL   AST 35  0 - 37 U/L   ALT 15  0 - 53 U/L   Alkaline Phosphatase 66  39 - 117 U/L   Total Bilirubin 0.4  0.3 - 1.2 mg/dL   GFR calc non Af Amer 16 (*) >90 mL/min   GFR calc Af Amer 18 (*) >90 mL/min    Dg Abd Acute W/chest  02/24/2012  *RADIOLOGY REPORT*  Clinical Data: Abdomen pain for 2 days  ACUTE ABDOMEN SERIES (ABDOMEN 2 VIEW & CHEST 1 VIEW)  Findings:  PA chest:  Compared December 02, 2011.  Findings:  No edema or consolidation.  Heart size and pulmonary vascularity are normal.  No adenopathy.  No bone  lesions.  Supine and upright abdomen:  Bowel gas pattern is unremarkable. There is stool throughout the colon.  No obstruction or free air is seen on this study.  There are phleboliths in the pelvis.  IMPRESSION: Nonspecific gas pattern.  No edema or consolidation in the lungs.   Original Report Authenticated By: Lowella Grip, M.D.      No diagnosis found.    MDM   The chart was scribed for me under my direct supervision.  I personally performed the history, physical, and medical decision making and all procedures in the evaluation of this patient.Maudry Diego, MD 02/24/12 1329

## 2012-02-24 NOTE — ED Notes (Signed)
Pt bsl 63, given ginger ale and graham cracker with peanut butter, refusing to stay for repeat blood sugar,

## 2012-02-24 NOTE — ED Notes (Signed)
Pt treated for UTI last week. Pt states still having abd pain.

## 2012-02-24 NOTE — ED Notes (Signed)
Pt became disgruntled because he did not receive food and wanted to speak with charge nurse. Stating he did not like the service here and he was supposed to get a food tray. Food tray was not ordered or approved by MD due to complaints of abdominal pain. Charge nurse speaking with patient.

## 2012-02-25 LAB — GLUCOSE, CAPILLARY: Glucose-Capillary: 63 mg/dL — ABNORMAL LOW (ref 70–99)

## 2012-03-01 ENCOUNTER — Emergency Department (HOSPITAL_COMMUNITY): Payer: Medicare Other

## 2012-03-01 ENCOUNTER — Encounter (HOSPITAL_COMMUNITY): Payer: Self-pay | Admitting: *Deleted

## 2012-03-01 ENCOUNTER — Emergency Department (HOSPITAL_COMMUNITY)
Admission: EM | Admit: 2012-03-01 | Discharge: 2012-03-01 | Disposition: A | Discharge status: Home / Self Care | Payer: Medicare Other | Attending: Emergency Medicine | Admitting: Emergency Medicine

## 2012-03-01 DIAGNOSIS — Z87440 Personal history of urinary (tract) infections: Secondary | ICD-10-CM | POA: Insufficient documentation

## 2012-03-01 DIAGNOSIS — M109 Gout, unspecified: Secondary | ICD-10-CM | POA: Insufficient documentation

## 2012-03-01 DIAGNOSIS — R110 Nausea: Secondary | ICD-10-CM | POA: Insufficient documentation

## 2012-03-01 DIAGNOSIS — G8929 Other chronic pain: Secondary | ICD-10-CM | POA: Insufficient documentation

## 2012-03-01 DIAGNOSIS — R51 Headache: Secondary | ICD-10-CM | POA: Insufficient documentation

## 2012-03-01 DIAGNOSIS — J3489 Other specified disorders of nose and nasal sinuses: Secondary | ICD-10-CM | POA: Insufficient documentation

## 2012-03-01 DIAGNOSIS — Z8679 Personal history of other diseases of the circulatory system: Secondary | ICD-10-CM | POA: Insufficient documentation

## 2012-03-01 DIAGNOSIS — I129 Hypertensive chronic kidney disease with stage 1 through stage 4 chronic kidney disease, or unspecified chronic kidney disease: Secondary | ICD-10-CM | POA: Insufficient documentation

## 2012-03-01 DIAGNOSIS — N400 Enlarged prostate without lower urinary tract symptoms: Secondary | ICD-10-CM | POA: Insufficient documentation

## 2012-03-01 DIAGNOSIS — Z87448 Personal history of other diseases of urinary system: Secondary | ICD-10-CM | POA: Insufficient documentation

## 2012-03-01 DIAGNOSIS — J069 Acute upper respiratory infection, unspecified: Secondary | ICD-10-CM

## 2012-03-01 DIAGNOSIS — E1142 Type 2 diabetes mellitus with diabetic polyneuropathy: Secondary | ICD-10-CM | POA: Insufficient documentation

## 2012-03-01 DIAGNOSIS — Z8619 Personal history of other infectious and parasitic diseases: Secondary | ICD-10-CM | POA: Insufficient documentation

## 2012-03-01 DIAGNOSIS — R509 Fever, unspecified: Secondary | ICD-10-CM | POA: Insufficient documentation

## 2012-03-01 DIAGNOSIS — E1149 Type 2 diabetes mellitus with other diabetic neurological complication: Secondary | ICD-10-CM | POA: Insufficient documentation

## 2012-03-01 DIAGNOSIS — N184 Chronic kidney disease, stage 4 (severe): Secondary | ICD-10-CM | POA: Insufficient documentation

## 2012-03-01 DIAGNOSIS — IMO0002 Reserved for concepts with insufficient information to code with codable children: Secondary | ICD-10-CM | POA: Insufficient documentation

## 2012-03-01 DIAGNOSIS — Z86718 Personal history of other venous thrombosis and embolism: Secondary | ICD-10-CM | POA: Insufficient documentation

## 2012-03-01 DIAGNOSIS — Z79899 Other long term (current) drug therapy: Secondary | ICD-10-CM | POA: Insufficient documentation

## 2012-03-01 DIAGNOSIS — R300 Dysuria: Secondary | ICD-10-CM | POA: Insufficient documentation

## 2012-03-01 DIAGNOSIS — Z862 Personal history of diseases of the blood and blood-forming organs and certain disorders involving the immune mechanism: Secondary | ICD-10-CM | POA: Insufficient documentation

## 2012-03-01 DIAGNOSIS — J029 Acute pharyngitis, unspecified: Secondary | ICD-10-CM | POA: Insufficient documentation

## 2012-03-01 DIAGNOSIS — R1084 Generalized abdominal pain: Secondary | ICD-10-CM | POA: Insufficient documentation

## 2012-03-01 LAB — POCT I-STAT, CHEM 8
Glucose, Bld: 84 mg/dL (ref 70–99)
HCT: 42 % (ref 39.0–52.0)
Hemoglobin: 14.3 g/dL (ref 13.0–17.0)
Potassium: 4.5 mEq/L (ref 3.5–5.1)
Sodium: 139 mEq/L (ref 135–145)

## 2012-03-01 LAB — URINALYSIS, ROUTINE W REFLEX MICROSCOPIC
Bilirubin Urine: NEGATIVE
Nitrite: NEGATIVE
Specific Gravity, Urine: 1.02 (ref 1.005–1.030)
Urobilinogen, UA: 0.2 mg/dL (ref 0.0–1.0)

## 2012-03-01 NOTE — ED Notes (Addendum)
Pt refused rapid strep screen. Pt reports "It makes me feel like I'm going to vomit, I don't want this done." MD aware. No new orders given at this time.

## 2012-03-01 NOTE — ED Provider Notes (Signed)
History   This chart was scribed for [name], MD by Hampton Abbot, ED Scribe. This patient was seen in room APA08/APA08 and the patient's care was started at 10:41 AM    CSN: [id number]  Arrival date & time 03/01/12  1013   First MD Initiated Contact with Patient 03/01/12 1023      Chief Complaint  Patient presents with  . Influenza     The history is provided by the patient. No language interpreter was used.   Jack Irwin is a 47 y.o. male with h/o HTN, DM, CKD, who presents to the Emergency Department complaining of 2 days of constant, non-changing, non-radiating, diffuse abdominal pain around umbilicus that comes and goes and lasts 2-3 seconds and is described as sharp with associated HA, clear rhinorrhea, cough producing white phlegm, fevers, chills, and sore throat worsened by swallowing.  Pt denies emesis, diarrhea, constipation, but has nausea.  No documented fever but feels hot and cold. Pt states he had an 55 month old staying at his house that had coughs, fever, and ear infection that left yesterday.  Pt states he had a UTI last week with similar abdominal pain and that he currently has some residual, mild dysuria but no frequency.  Pt is done with antibiotic regiment for UTI.  Pt was on prednisone for arthritis gout but is no longer taking it because he ran out 3 months ago and states he feels weaker now that he is not taking it.  Pt denies tobacco and alcohol use.   PCP Dr [name] Rheumatologist Dr Charlestine Night  Past Medical History  Diagnosis Date  . Hypertension   . Diabetes mellitus   . Gout   . Chronic back pain   . BPH (benign prostatic hyperplasia)   . Anemia   . DVT (deep venous thrombosis)   . Neuropathy   . Decubitus ulcer     of 2nd toes of both feet.  . Edema leg   . Constipation   . Venous (peripheral) insufficiency   . Neuropathy, diabetic   . Physical deconditioning   . Renal insufficiency   . Chronic kidney disease   . Chronic kidney disease  (CKD), stage IV (severe)     Past Surgical History  Procedure Date  . None     Family History  Problem Relation Age of Onset  . Diabetes Mother   . Hypertension Mother   . Heart failure Mother   . Hyperlipidemia Mother   . Cancer Father   . Diabetes Father   . Hypertension Father   . Hyperlipidemia Father     History  Substance Use Topics  . Smoking status: Never Smoker   . Smokeless tobacco: Never Used  . Alcohol Use: No   On disabilty   Review of Systems  Constitutional: Positive for fever and chills.  HENT: Positive for sore throat and rhinorrhea.   Respiratory: Positive for cough.   Gastrointestinal: Positive for abdominal pain. Negative for nausea, vomiting, diarrhea and constipation.  Genitourinary: Positive for dysuria. Negative for frequency.  Neurological: Positive for headaches.  All other systems reviewed and are negative.    Allergies  Review of patient's allergies indicates no known allergies.  Home Medications   Current Outpatient Rx  Name  Route  Sig  Dispense  Refill  . CALCIUM ACETATE 667 MG PO CAPS   Oral   Take 1,334 mg by mouth 3 (three) times daily with meals. BINDER         .  VITAMIN D 1000 UNITS PO TABS   Oral   Take 1,000 Units by mouth daily.         [name] CIPROFLOXACIN HCL 500 MG PO TABS   Oral   Take 1 tablet (500 mg total) by mouth 2 (two) times daily. One po bid x 7 days   14 tablet   0   . COLCHICINE 0.6 MG PO TABS   Oral   Take 0.6 mg by mouth 2 (two) times daily as needed. For GOUT PAIN         . FEBUXOSTAT 80 MG PO TABS   Oral   Take 1 tablet by mouth daily as needed. For GOUT/HIGH URIC ACID         . FUROSEMIDE 40 MG PO TABS   Oral   Take 1 tablet (40 mg total) by mouth daily.   5 tablet   0   . HYOSCYAMINE SULFATE 0.125 MG PO TABS   Oral   Take 1 tablet (0.125 mg total) by mouth every 4 (four) hours as needed for cramping.   30 tablet   0   . METOPROLOL TARTRATE 50 MG PO TABS   Oral   Take 50 mg  by mouth daily. For HEART         . ONDANSETRON 4 MG PO TBDP   Oral   Take 1 tablet (4 mg total) by mouth every 8 (eight) hours as needed for nausea.   12 tablet   0   . OXYCODONE-ACETAMINOPHEN 5-325 MG PO TABS   Oral   Take 1-2 tablets by mouth every 4 (four) hours as needed for pain.   10 tablet   0   . PREDNISONE 10 MG PO TABS   Oral   Take 10 mg by mouth daily as needed. For arthritis and swelling         . VARDENAFIL HCL 20 MG PO TABS   Oral   Take 20 mg by mouth daily as needed. For sexual intercourse         Not taking prednisone for the past 3 months when he ran out    BP 135/91  Pulse 69  Temp 97.5 F (36.4 C) (Oral)  Resp 16  Ht 5\' 11"  (1.803 m)  Wt 290 lb (131.543 kg)  BMI 40.45 kg/m2  SpO2 100%  Vital signs normal    Physical Exam  Nursing note and vitals reviewed. Constitutional: He is oriented to person, place, and time. He appears well-developed and well-nourished.  Non-toxic appearance. He does not appear ill. No distress.  HENT:  Head: Normocephalic and atraumatic.  Right Ear: External ear normal.  Left Ear: External ear normal.  Nose: Nose normal. No mucosal edema or rhinorrhea.  Mouth/Throat: Oropharynx is clear and moist and mucous membranes are normal. No dental abscesses or uvula swelling.  Eyes: Conjunctivae normal and EOM are normal. Pupils are equal, round, and reactive to light.  Neck: Normal range of motion and full passive range of motion without pain. Neck supple.  Cardiovascular: Normal rate, regular rhythm and normal heart sounds.  Exam reveals no gallop and no friction rub.   No murmur heard. Pulmonary/Chest: Effort normal and breath sounds normal. No respiratory distress. He has no wheezes. He has no rhonchi. He has no rales. He exhibits no tenderness and no crepitus.  Abdominal: Soft. Normal appearance and bowel sounds are normal. He exhibits no distension. There is no tenderness. There is no rebound and no guarding.  Tender to palpation over entire abdomen except LUQ.  Musculoskeletal: Normal range of motion. He exhibits no edema and no tenderness.       Moves all extremities well.  Swan-neck deformity of his LLF due to arthritis.  Neurological: He is alert and oriented to person, place, and time. He has normal strength. No cranial nerve deficit.  Skin: Skin is warm, dry and intact. No rash noted. No erythema. No pallor.  Psychiatric: He has a normal mood and affect. His speech is normal and behavior is normal. His mood appears not anxious.    ED Course  Procedures (including critical care time) DIAGNOSTIC STUDIES: Oxygen Saturation is 100% on room air, normal by my interpretation.    COORDINATION OF CARE: 10:52 AM- Patient informed of clinical course, understands medical decision-making process, and agrees with plan.  Ordered rapid strep screen and chest XR.  12:26 PM- Re-check, informed pt of normal kidney function. Pt in NAD, on the phone, requesting to eat.  Results for orders placed during the hospital encounter of 03/01/12  POCT I-STAT, CHEM 8      Component Value Range   Sodium 139  135 - 145 mEq/L   Potassium 4.5  3.5 - 5.1 mEq/L   Chloride 109  96 - 112 mEq/L   BUN 55 (*) 6 - 23 mg/dL   Creatinine, Ser 4.10 (*) 0.50 - 1.35 mg/dL   Glucose, Bld 84  70 - 99 mg/dL   Calcium, Ion 1.21  1.12 - 1.23 mmol/L   TCO2 23  0 - 100 mmol/L   Hemoglobin 14.3  13.0 - 17.0 g/dL   HCT 42.0  39.0 - 52.0 %  URINALYSIS, ROUTINE W REFLEX MICROSCOPIC      Component Value Range   Color, Urine YELLOW  YELLOW   APPearance CLEAR  CLEAR   Specific Gravity, Urine 1.020  1.005 - 1.030   pH 5.5  5.0 - 8.0   Glucose, UA NEGATIVE  NEGATIVE mg/dL   Hgb urine dipstick TRACE (*) NEGATIVE   Bilirubin Urine NEGATIVE  NEGATIVE   Ketones, ur NEGATIVE  NEGATIVE mg/dL   Protein, ur 100 (*) NEGATIVE mg/dL   Urobilinogen, UA 0.2  0.0 - 1.0 mg/dL   Nitrite NEGATIVE  NEGATIVE   Leukocytes, UA NEGATIVE  NEGATIVE  URINE  MICROSCOPIC-ADD ON      Component Value Range   WBC, UA 0-2  <3 WBC/hpf   Laboratory interpretation all normal except stable CRI  Pt refused strept screen because it made him gag   Dg Chest 2 View  03/01/2012  *RADIOLOGY REPORT*  Clinical Data: Flu-like symptoms.  CHEST - 2 VIEW  Comparison: 02/24/2012 and 12/02/2011.  Findings: The heart size and mediastinal contours are stable. There is mild chronic central airway thickening.  The lungs are otherwise clear and are not significantly hyperinflated.  There is no pleural effusion or pneumothorax.  No acute osseous abnormalities are identified.  There are asymmetric left humeral head osteophytes.  IMPRESSION: Stable examination.  No acute cardiopulmonary process.   Original Report Authenticated By: Richardean Sale, M.D.      1. URI (upper respiratory infection)    Plan discharge   Rolland Porter, MD, [place]   MDM    I personally performed the services described in this documentation, which was scribed in my presence. The recorded information has been reviewed and considered.  Rolland Porter, MD, [facility]        [name], MD 03/01/12 1249

## 2012-03-01 NOTE — ED Notes (Signed)
Flu like sx x 3 days with headache, congestion, chills.

## 2012-03-15 IMAGING — CR DG TOE 2ND 2+V*L*
1 series · 1 of 1 positions shown · non-contrast
Comparison: Left foot exam from 07/16/2009 and 05/06/2009

CLINICAL DATA: Rule out osteomyelitis in the second toe.

LEFT TOE - 2+ VIEW

[view not recorded]
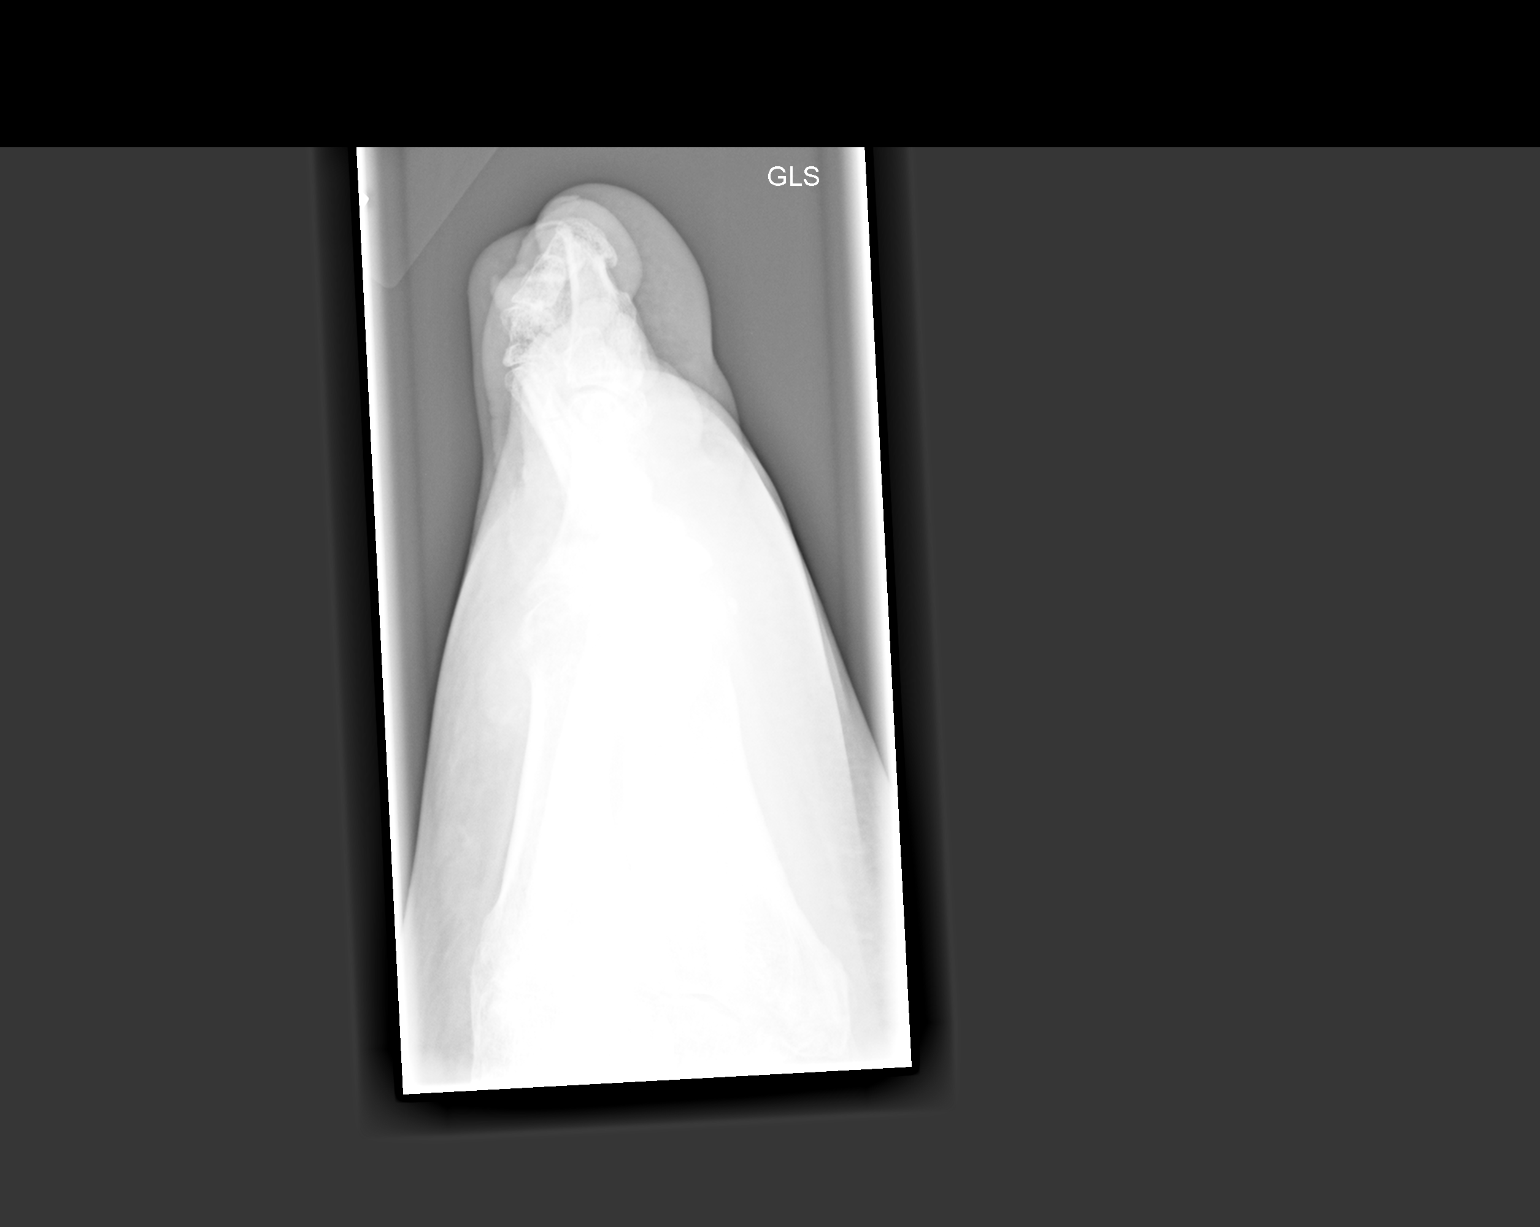

[1 of 1 positions shown; findings below may reference images not displayed]

FINDINGS: Three images of the left second toe were obtained.  Again
noted is deformity of the second toe middle phalanx with bone loss
or erosions near the PIP joint.  Severe degenerative changes
involving the first toe MTP joint.  There is joint space loss
involving the second toe PIP joint with small erosions in this
area.
IMPRESSION: Chronic deformity with erosive changes involving the
second toe interphalangeal joints.  There has been minimal change
since the prior examinations.  Findings are consistent with
previous inflammation such as gout or osteomyelitis.  MRI would be
more sensitive and specific to evaluate for acute inflammation and
osteomyelitis.

## 2012-03-23 ENCOUNTER — Emergency Department (HOSPITAL_COMMUNITY): Payer: Medicare Other

## 2012-03-23 ENCOUNTER — Encounter (HOSPITAL_COMMUNITY): Payer: Self-pay

## 2012-03-23 ENCOUNTER — Emergency Department (HOSPITAL_COMMUNITY)
Admission: EM | Admit: 2012-03-23 | Discharge: 2012-03-23 | Disposition: A | Discharge status: Home / Self Care | Payer: Medicare Other | Attending: Emergency Medicine | Admitting: Emergency Medicine

## 2012-03-23 DIAGNOSIS — D571 Sickle-cell disease without crisis: Secondary | ICD-10-CM | POA: Insufficient documentation

## 2012-03-23 DIAGNOSIS — I82409 Acute embolism and thrombosis of unspecified deep veins of unspecified lower extremity: Secondary | ICD-10-CM | POA: Insufficient documentation

## 2012-03-23 DIAGNOSIS — I509 Heart failure, unspecified: Secondary | ICD-10-CM | POA: Insufficient documentation

## 2012-03-23 DIAGNOSIS — M109 Gout, unspecified: Secondary | ICD-10-CM | POA: Insufficient documentation

## 2012-03-23 DIAGNOSIS — Z872 Personal history of diseases of the skin and subcutaneous tissue: Secondary | ICD-10-CM | POA: Insufficient documentation

## 2012-03-23 DIAGNOSIS — Z87448 Personal history of other diseases of urinary system: Secondary | ICD-10-CM | POA: Insufficient documentation

## 2012-03-23 DIAGNOSIS — E1149 Type 2 diabetes mellitus with other diabetic neurological complication: Secondary | ICD-10-CM | POA: Insufficient documentation

## 2012-03-23 DIAGNOSIS — I129 Hypertensive chronic kidney disease with stage 1 through stage 4 chronic kidney disease, or unspecified chronic kidney disease: Secondary | ICD-10-CM | POA: Insufficient documentation

## 2012-03-23 DIAGNOSIS — Z4800 Encounter for change or removal of nonsurgical wound dressing: Secondary | ICD-10-CM | POA: Insufficient documentation

## 2012-03-23 DIAGNOSIS — R269 Unspecified abnormalities of gait and mobility: Secondary | ICD-10-CM | POA: Insufficient documentation

## 2012-03-23 DIAGNOSIS — R0789 Other chest pain: Secondary | ICD-10-CM

## 2012-03-23 DIAGNOSIS — IMO0001 Reserved for inherently not codable concepts without codable children: Secondary | ICD-10-CM | POA: Insufficient documentation

## 2012-03-23 DIAGNOSIS — J45901 Unspecified asthma with (acute) exacerbation: Secondary | ICD-10-CM | POA: Insufficient documentation

## 2012-03-23 DIAGNOSIS — M2550 Pain in unspecified joint: Secondary | ICD-10-CM | POA: Insufficient documentation

## 2012-03-23 DIAGNOSIS — M549 Dorsalgia, unspecified: Secondary | ICD-10-CM | POA: Insufficient documentation

## 2012-03-23 DIAGNOSIS — Z79899 Other long term (current) drug therapy: Secondary | ICD-10-CM | POA: Insufficient documentation

## 2012-03-23 DIAGNOSIS — M1990 Unspecified osteoarthritis, unspecified site: Secondary | ICD-10-CM | POA: Insufficient documentation

## 2012-03-23 DIAGNOSIS — G909 Disorder of the autonomic nervous system, unspecified: Secondary | ICD-10-CM | POA: Insufficient documentation

## 2012-03-23 DIAGNOSIS — G8929 Other chronic pain: Secondary | ICD-10-CM | POA: Insufficient documentation

## 2012-03-23 DIAGNOSIS — Z8679 Personal history of other diseases of the circulatory system: Secondary | ICD-10-CM | POA: Insufficient documentation

## 2012-03-23 DIAGNOSIS — R071 Chest pain on breathing: Secondary | ICD-10-CM | POA: Insufficient documentation

## 2012-03-23 DIAGNOSIS — N184 Chronic kidney disease, stage 4 (severe): Secondary | ICD-10-CM | POA: Insufficient documentation

## 2012-03-23 MED ORDER — HYDROCODONE-ACETAMINOPHEN 5-325 MG PO TABS
2.0000 | ORAL_TABLET | Freq: Once | ORAL | Status: AC
  Administered 2012-03-23: 2 via ORAL
  Filled 2012-03-23: qty 2

## 2012-03-23 NOTE — ED Notes (Signed)
Pt has sore area to rt 2nd toe that is white, Does not know how long it has been there, Saw his MD yesterday and forgot to show it to him.  "My back hurts. And cough for 3  Weeks.

## 2012-03-23 NOTE — ED Notes (Signed)
Pt given crackers and diet soda 

## 2012-03-23 NOTE — ED Notes (Signed)
1. Pt reports forgetting to tell his pmd yesterday that he wanted him to check his right foot great toe, the nail is hurting for 1 day 2. Cough and congestion for 3 weeks, also forgot to tell his pmd yesterday. deneis any fever

## 2012-03-23 NOTE — ED Provider Notes (Signed)
History     CSN: [id number]  Arrival date & time 03/23/12  1653   None     Chief Complaint  Patient presents with  . Wound Check  . Cough    (Consider location/radiation/quality/duration/timing/severity/associated sxs/prior treatment) HPI Comments: Patient is a 47 year old African American male who unfortunately suffers from hypertension, diabetes mellitus, gout, chronic back pain, deep vein thrombosis, neuropathy, renal insufficiency, chronic kidney disease stage IV, sickle cell anemia, congestive heart failure, and asthma. The patient states that he was seen by his primary care physician one to 2 days ago, but he forgot to mention to the doctor that he had a problem with his right second toe, as well as a cough for approximately a week that is now making his chest sore. The patient presents to the emergency department for assistance with these issues. There's not been any high fevers reported. The patient has shortness of breath with activities of daily living, but this is not new for him. He denies coughing up any blood, and there's been no recent illnesses reported.  Patient is a 47 y.o. male presenting with wound check and cough.  Wound Check Associated symptoms include arthralgias, coughing and myalgias. Pertinent negatives include no abdominal pain, chest pain, chills, fever or neck pain.  Cough Associated symptoms: myalgias and shortness of breath   Associated symptoms: no chest pain, no chills, no eye discharge, no fever and no wheezing     Past Medical History  Diagnosis Date  . Hypertension   . Diabetes mellitus   . Gout   . Chronic back pain   . BPH (benign prostatic hyperplasia)   . Anemia   . DVT (deep venous thrombosis)   . Neuropathy   . Decubitus ulcer     of 2nd toes of both feet.  . Edema leg   . Constipation   . Venous (peripheral) insufficiency   . Neuropathy, diabetic   . Physical deconditioning   . Renal insufficiency   . Chronic kidney disease   .  Chronic kidney disease (CKD), stage IV (severe)     Past Surgical History  Procedure Laterality Date  . None      Family History  Problem Relation Age of Onset  . Diabetes Mother   . Hypertension Mother   . Heart failure Mother   . Hyperlipidemia Mother   . Cancer Father   . Diabetes Father   . Hypertension Father   . Hyperlipidemia Father     History  Substance Use Topics  . Smoking status: Never Smoker   . Smokeless tobacco: Never Used  . Alcohol Use: No      Review of Systems  Constitutional: Negative for fever, chills and activity change.       All ROS Neg except as noted in HPI  HENT: Negative for nosebleeds and neck pain.   Eyes: Negative for photophobia and discharge.  Respiratory: Positive for cough and shortness of breath. Negative for wheezing.   Cardiovascular: Positive for leg swelling. Negative for chest pain and palpitations.  Gastrointestinal: Negative for abdominal pain and blood in stool.  Genitourinary: Negative for dysuria, frequency and hematuria.  Musculoskeletal: Positive for myalgias, back pain, arthralgias and gait problem.  Skin: Negative.   Neurological: Negative for dizziness, seizures and speech difficulty.  Psychiatric/Behavioral: Negative for hallucinations and confusion.   patient is a 47 year old and African American male who unfortunately has a history of diabetes mellitus, gout, chronic back pain, deep vein thrombosis, neuropathy of multiple sites, dependent  edema, renal insufficiency, chronic kidney disease stage IV, and sickle cell anemia, and congestive heart hair, and asthma.  Allergies  Review of patient's allergies indicates no known allergies.  Home Medications   Current Outpatient Rx  Name  Route  Sig  Dispense  Refill  . calcium acetate (PHOSLO) 667 MG capsule   Oral   Take 1,334 mg by mouth 3 (three) times daily with meals. BINDER         . cholecalciferol (VITAMIN D) 1000 UNITS tablet   Oral   Take 1,000 Units by  mouth daily.         . ciprofloxacin (CIPRO) 500 MG tablet   Oral   Take 1 tablet (500 mg total) by mouth 2 (two) times daily. One po bid x 7 days   14 tablet   0   . colchicine 0.6 MG tablet   Oral   Take 0.6 mg by mouth 2 (two) times daily as needed. For GOUT PAIN         . Febuxostat (ULORIC) 80 MG TABS   Oral   Take 1 tablet by mouth daily as needed. For GOUT/HIGH URIC ACID         . furosemide (LASIX) 40 MG tablet   Oral   Take 1 tablet (40 mg total) by mouth daily.   5 tablet   0   . hyoscyamine (LEVSIN) 0.125 MG tablet   Oral   Take 1 tablet (0.125 mg total) by mouth every 4 (four) hours as needed for cramping.   30 tablet   0   . metoprolol (LOPRESSOR) 50 MG tablet   Oral   Take 50 mg by mouth daily. For HEART         . ondansetron (ZOFRAN ODT) 4 MG disintegrating tablet   Oral   Take 1 tablet (4 mg total) by mouth every 8 (eight) hours as needed for nausea.   12 tablet   0   . oxyCODONE-acetaminophen (PERCOCET/ROXICET) 5-325 MG per tablet   Oral   Take 1-2 tablets by mouth every 4 (four) hours as needed for pain.   10 tablet   0   . predniSONE (DELTASONE) 10 MG tablet   Oral   Take 10 mg by mouth daily as needed. For arthritis and swelling         . vardenafil (LEVITRA) 20 MG tablet   Oral   Take 20 mg by mouth daily as needed. For sexual intercourse           BP 146/96  Pulse 76  Temp(Src) 97.6 F (36.4 C) (Oral)  Resp 20  Wt 298 lb (135.172 kg)  BMI 41.58 kg/m2  SpO2 100%  Physical Exam  Nursing note and vitals reviewed. Constitutional: He is oriented to person, place, and time. He appears well-developed and well-nourished.  Non-toxic appearance.  HENT:  Head: Normocephalic.  Right Ear: Tympanic membrane and external ear normal.  Left Ear: Tympanic membrane and external ear normal.  Eyes: EOM and lids are normal. Pupils are equal, round, and reactive to light.  Neck: Normal range of motion. Neck supple. Carotid bruit is not  present.  Cardiovascular: Normal rate, regular rhythm, normal heart sounds, intact distal pulses and normal pulses.   Pulmonary/Chest: Breath sounds normal. No respiratory distress.  Chest wall tenderness to palpation. Symmetrical rise and fall of the chest, no rales or wheezes appreciated on lung examination.  Abdominal: Soft. Bowel sounds are normal. There is no tenderness. There is no  guarding.  Musculoskeletal: Normal range of motion.  Both feet and lower legs are swollen. The right foot shows the first and second toes are dark. There are fungal infected nails of the toes on the right foot. On the right second toe there is a long nail with a fungus thickened nail underneath the initial nail. The distal tip of the toe is tender to touch. There is no drainage from the toe. The toe was not hot. There is no bleeding from the toe.  Lymphadenopathy:       Head (right side): No submandibular adenopathy present.       Head (left side): No submandibular adenopathy present.    He has no cervical adenopathy.  Neurological: He is alert and oriented to person, place, and time. He has normal strength. No cranial nerve deficit or sensory deficit.  Skin: Skin is warm and dry.  Psychiatric: He has a normal mood and affect. His speech is normal.    ED Course  Procedures (including critical care time)  Labs Reviewed - No data to display No results found.   No diagnosis found.    MDM  I have reviewed nursing notes, vital signs, and all appropriate lab and imaging results for this patient. Pt c/o pain of the right 1st and 2nd toes. No ulcer noted Xray is negative for ocult fx or fb, or gas. Pt c/o cough and congestion. Temp 97.6. Chest xray negative for pneumonia. Pt given norco in the ED. He will see his PCP for any new changes, or return to ED if needed.       Lenox Ahr, [place] 03/26/12 1008

## 2012-03-26 NOTE — ED Provider Notes (Signed)
History/physical exam/procedure(s) were performed by non-physician practitioner and as supervising physician I was immediately available for consultation/collaboration. I have reviewed all notes and am in agreement with care and plan.   Shaune Pollack, MD 03/26/12 [phone]

## 2012-03-30 ENCOUNTER — Inpatient Hospital Stay (HOSPITAL_COMMUNITY): Admission: RE | Admit: 2012-03-30 | Payer: Medicare Other | Source: Ambulatory Visit

## 2012-04-02 ENCOUNTER — Emergency Department (HOSPITAL_COMMUNITY)
Admission: EM | Admit: 2012-04-02 | Discharge: 2012-04-02 | Disposition: A | Discharge status: Home / Self Care | Payer: Medicare Other | Attending: Emergency Medicine | Admitting: Emergency Medicine

## 2012-04-02 ENCOUNTER — Emergency Department (HOSPITAL_COMMUNITY): Payer: Medicare Other

## 2012-04-02 ENCOUNTER — Encounter (HOSPITAL_COMMUNITY): Payer: Self-pay | Admitting: *Deleted

## 2012-04-02 DIAGNOSIS — Z862 Personal history of diseases of the blood and blood-forming organs and certain disorders involving the immune mechanism: Secondary | ICD-10-CM | POA: Insufficient documentation

## 2012-04-02 DIAGNOSIS — IMO0001 Reserved for inherently not codable concepts without codable children: Secondary | ICD-10-CM | POA: Insufficient documentation

## 2012-04-02 DIAGNOSIS — J3489 Other specified disorders of nose and nasal sinuses: Secondary | ICD-10-CM | POA: Insufficient documentation

## 2012-04-02 DIAGNOSIS — K529 Noninfective gastroenteritis and colitis, unspecified: Secondary | ICD-10-CM

## 2012-04-02 DIAGNOSIS — R079 Chest pain, unspecified: Secondary | ICD-10-CM | POA: Insufficient documentation

## 2012-04-02 DIAGNOSIS — R059 Cough, unspecified: Secondary | ICD-10-CM | POA: Insufficient documentation

## 2012-04-02 DIAGNOSIS — G909 Disorder of the autonomic nervous system, unspecified: Secondary | ICD-10-CM | POA: Insufficient documentation

## 2012-04-02 DIAGNOSIS — R05 Cough: Secondary | ICD-10-CM | POA: Insufficient documentation

## 2012-04-02 DIAGNOSIS — B9789 Other viral agents as the cause of diseases classified elsewhere: Secondary | ICD-10-CM | POA: Insufficient documentation

## 2012-04-02 DIAGNOSIS — Z86718 Personal history of other venous thrombosis and embolism: Secondary | ICD-10-CM | POA: Insufficient documentation

## 2012-04-02 DIAGNOSIS — B349 Viral infection, unspecified: Secondary | ICD-10-CM

## 2012-04-02 DIAGNOSIS — G8929 Other chronic pain: Secondary | ICD-10-CM | POA: Insufficient documentation

## 2012-04-02 DIAGNOSIS — Z872 Personal history of diseases of the skin and subcutaneous tissue: Secondary | ICD-10-CM | POA: Insufficient documentation

## 2012-04-02 DIAGNOSIS — R0602 Shortness of breath: Secondary | ICD-10-CM | POA: Insufficient documentation

## 2012-04-02 DIAGNOSIS — K5289 Other specified noninfective gastroenteritis and colitis: Secondary | ICD-10-CM | POA: Insufficient documentation

## 2012-04-02 DIAGNOSIS — J029 Acute pharyngitis, unspecified: Secondary | ICD-10-CM | POA: Insufficient documentation

## 2012-04-02 DIAGNOSIS — E1149 Type 2 diabetes mellitus with other diabetic neurological complication: Secondary | ICD-10-CM | POA: Insufficient documentation

## 2012-04-02 DIAGNOSIS — R42 Dizziness and giddiness: Secondary | ICD-10-CM | POA: Insufficient documentation

## 2012-04-02 DIAGNOSIS — J069 Acute upper respiratory infection, unspecified: Secondary | ICD-10-CM

## 2012-04-02 DIAGNOSIS — R197 Diarrhea, unspecified: Secondary | ICD-10-CM | POA: Insufficient documentation

## 2012-04-02 DIAGNOSIS — M109 Gout, unspecified: Secondary | ICD-10-CM | POA: Insufficient documentation

## 2012-04-02 DIAGNOSIS — R21 Rash and other nonspecific skin eruption: Secondary | ICD-10-CM | POA: Insufficient documentation

## 2012-04-02 DIAGNOSIS — Z79899 Other long term (current) drug therapy: Secondary | ICD-10-CM | POA: Insufficient documentation

## 2012-04-02 DIAGNOSIS — I129 Hypertensive chronic kidney disease with stage 1 through stage 4 chronic kidney disease, or unspecified chronic kidney disease: Secondary | ICD-10-CM | POA: Insufficient documentation

## 2012-04-02 DIAGNOSIS — N184 Chronic kidney disease, stage 4 (severe): Secondary | ICD-10-CM | POA: Insufficient documentation

## 2012-04-02 DIAGNOSIS — M549 Dorsalgia, unspecified: Secondary | ICD-10-CM | POA: Insufficient documentation

## 2012-04-02 LAB — CBC WITH DIFFERENTIAL/PLATELET
Basophils Absolute: 0 10*3/uL (ref 0.0–0.1)
Basophils Relative: 0 % (ref 0–1)
Eosinophils Absolute: 0.2 10*3/uL (ref 0.0–0.7)
Eosinophils Relative: 3 % (ref 0–5)
Lymphocytes Relative: 27 % (ref 12–46)
MCV: 82.5 fL (ref 78.0–100.0)
Platelets: 214 10*3/uL (ref 150–400)
RDW: 17.6 % — ABNORMAL HIGH (ref 11.5–15.5)
WBC: 8.9 10*3/uL (ref 4.0–10.5)

## 2012-04-02 LAB — COMPREHENSIVE METABOLIC PANEL
ALT: 21 U/L (ref 0–53)
AST: 40 U/L — ABNORMAL HIGH (ref 0–37)
Albumin: 3.3 g/dL — ABNORMAL LOW (ref 3.5–5.2)
CO2: 23 mEq/L (ref 19–32)
Calcium: 9.2 mg/dL (ref 8.4–10.5)
Sodium: 137 mEq/L (ref 135–145)
Total Protein: 7.4 g/dL (ref 6.0–8.3)

## 2012-04-02 MED ORDER — HYDROCODONE-ACETAMINOPHEN 5-325 MG PO TABS: 1.0000 | ORAL_TABLET | Freq: Four times a day (QID) | ORAL | Status: DC | PRN

## 2012-04-02 MED ORDER — MUCINEX DM 30-600 MG PO TB12: 1.0000 | ORAL_TABLET | Freq: Two times a day (BID) | ORAL | Status: DC

## 2012-04-02 MED ORDER — LOPERAMIDE HCL 2 MG PO TABS: 2.0000 mg | ORAL_TABLET | Freq: Four times a day (QID) | ORAL | Status: DC | PRN

## 2012-04-02 MED ORDER — SODIUM CHLORIDE 0.9 % IV BOLUS (SEPSIS)
1000.0000 mL | Freq: Once | INTRAVENOUS | Status: AC
  Administered 2012-04-02: 1000 mL via INTRAVENOUS

## 2012-04-02 MED ORDER — ONDANSETRON 4 MG PO TBDP: 4.0000 mg | ORAL_TABLET | Freq: Three times a day (TID) | ORAL | Status: DC | PRN

## 2012-04-02 MED ORDER — ONDANSETRON HCL 4 MG/2ML IJ SOLN
4.0000 mg | Freq: Once | INTRAMUSCULAR | Status: AC
  Administered 2012-04-02: 4 mg via INTRAVENOUS
  Filled 2012-04-02: qty 2

## 2012-04-02 MED ORDER — SODIUM CHLORIDE 0.9 % IV SOLN: INTRAVENOUS | Status: DC

## 2012-04-02 MED ORDER — HYDROMORPHONE HCL PF 1 MG/ML IJ SOLN
1.0000 mg | Freq: Once | INTRAMUSCULAR | Status: AC
  Administered 2012-04-02: 1 mg via INTRAVENOUS
  Filled 2012-04-02: qty 1

## 2012-04-02 NOTE — ED Notes (Signed)
abd pain, nausea, no vomiting, has diarrhea, sore throat, runny nose.

## 2012-04-02 NOTE — ED Provider Notes (Signed)
History    Scribed for Mervin Kung, MD, the patient was seen in room APA19/APA19. This chart was scribed by Denice Bors, ED scribe. Patient's care was started at 1807  CSN: [id number]  Arrival date & time 04/02/12  1749   First MD Initiated Contact with Patient 04/02/12 1757      Chief Complaint  Patient presents with  . Abdominal Pain    (Consider location/radiation/quality/duration/timing/severity/associated sxs/prior treatment) HPI Jack Irwin is a 47 y.o. male who presents to the Emergency Department complaining of waxing and waning mild left lower quadrant abdominal pain onset 2 days. Pt reports nausea, diarrhea, subjective fever, chills, rhinorrhea, sore throat, productive cough, intermittent chest pain, intermittent shortness of breath, generalized myalgias, "bump on anus," old rash, and dizziness.  Pt denies vomiting, melena, hematochezia, dysuria, and hematuria. Pt reports 2 sick contacts at home with similar symptoms.  Past Medical History  Diagnosis Date  . Hypertension   . Diabetes mellitus   . Gout   . Chronic back pain   . BPH (benign prostatic hyperplasia)   . Anemia   . DVT (deep venous thrombosis)   . Neuropathy   . Decubitus ulcer     of 2nd toes of both feet.  . Edema leg   . Constipation   . Venous (peripheral) insufficiency   . Neuropathy, diabetic   . Physical deconditioning   . Renal insufficiency   . Chronic kidney disease   . Chronic kidney disease (CKD), stage IV (severe)     Past Surgical History  Procedure Laterality Date  . None      Family History  Problem Relation Age of Onset  . Diabetes Mother   . Hypertension Mother   . Heart failure Mother   . Hyperlipidemia Mother   . Cancer Father   . Diabetes Father   . Hypertension Father   . Hyperlipidemia Father     History  Substance Use Topics  . Smoking status: Never Smoker   . Smokeless tobacco: Never Used  . Alcohol Use: No      Review of Systems   Constitutional: Positive for fever and chills.  HENT: Positive for sore throat and rhinorrhea.   Eyes: Negative for visual disturbance.  Respiratory: Positive for cough (productive) and shortness of breath.   Cardiovascular: Positive for chest pain (intermittent ).  Gastrointestinal: Positive for nausea, abdominal pain and diarrhea. Negative for vomiting and blood in stool.  Genitourinary: Negative for dysuria and hematuria.  Musculoskeletal: Positive for myalgias. Negative for back pain.  Skin: Positive for rash (old).  Neurological: Positive for dizziness.  Hematological: Does not bruise/bleed easily.  All other systems reviewed and are negative.  A complete 10 system review of systems was obtained and all systems are negative except as noted in the HPI and PMH.    Allergies  Review of patient's allergies indicates no known allergies.  Home Medications   Current Outpatient Rx  Name  Route  Sig  Dispense  Refill  . calcium acetate (PHOSLO) 667 MG capsule   Oral   Take 1,334 mg by mouth 3 (three) times daily with meals. BINDER         . cholecalciferol (VITAMIN D) 1000 UNITS tablet   Oral   Take 1,000 Units by mouth daily.         . colchicine 0.6 MG tablet   Oral   Take 0.6 mg by mouth 2 (two) times daily as needed. For GOUT PAIN         .  Febuxostat (ULORIC) 80 MG TABS   Oral   Take 1 tablet by mouth daily as needed. For GOUT/HIGH URIC ACID         . furosemide (LASIX) 40 MG tablet   Oral   Take 1 tablet (40 mg total) by mouth daily.   5 tablet   0   . hyoscyamine (LEVSIN) 0.125 MG tablet   Oral   Take 1 tablet (0.125 mg total) by mouth every 4 (four) hours as needed for cramping.   30 tablet   0   . metoprolol (LOPRESSOR) 50 MG tablet   Oral   Take 50 mg by mouth daily. For HEART         . ondansetron (ZOFRAN ODT) 4 MG disintegrating tablet   Oral   Take 1 tablet (4 mg total) by mouth every 8 (eight) hours as needed for nausea.   12 tablet    0   . oxyCODONE-acetaminophen (PERCOCET/ROXICET) 5-325 MG per tablet   Oral   Take 1-2 tablets by mouth every 4 (four) hours as needed for pain.   10 tablet   0   . Dextromethorphan-Guaifenesin (MUCINEX DM) 30-600 MG TB12   Oral   Take 1 tablet by mouth every 12 (twelve) hours.   28 each   0   . HYDROcodone-acetaminophen (NORCO/VICODIN) 5-325 MG per tablet   Oral   Take 1-2 tablets by mouth every 6 (six) hours as needed for pain.   14 tablet   0   . loperamide (IMODIUM A-D) 2 MG tablet   Oral   Take 1 tablet (2 mg total) by mouth 4 (four) times daily as needed for diarrhea or loose stools.   30 tablet   0   . ondansetron (ZOFRAN ODT) 4 MG disintegrating tablet   Oral   Take 1 tablet (4 mg total) by mouth every 8 (eight) hours as needed for nausea.   10 tablet   0   . vardenafil (LEVITRA) 20 MG tablet   Oral   Take 20 mg by mouth daily as needed. For sexual intercourse           BP 134/84  Pulse 88  Temp(Src) 97.7 F (36.5 C) (Oral)  Resp 20  Ht 5\' 10"  (1.778 m)  Wt 298 lb (135.172 kg)  BMI 42.76 kg/m2  SpO2 100%  Physical Exam  Nursing note and vitals reviewed. Constitutional: He is oriented to person, place, and time. He appears well-developed and well-nourished. No distress.  HENT:  Head: Normocephalic and atraumatic.  Mouth/Throat: Oropharynx is clear and moist. No oropharyngeal exudate.  Eyes: Conjunctivae and EOM are normal. Pupils are equal, round, and reactive to light. No scleral icterus.  Neck: Neck supple. No tracheal deviation present.  Cardiovascular: Normal rate, regular rhythm and normal heart sounds.   No murmur heard. Pulmonary/Chest: Effort normal and breath sounds normal. No respiratory distress. He has no wheezes.  Abdominal: Soft. Bowel sounds are normal. He exhibits no distension. There is tenderness (LLQ mild). There is no rebound and no guarding.  Genitourinary: Rectal exam shows external hemorrhoid.  Musculoskeletal: Normal range  of motion. He exhibits edema (mild).  Neurological: He is alert and oriented to person, place, and time.  Skin: Skin is warm and dry. No rash noted.  Dry skin   Psychiatric: He has a normal mood and affect. His behavior is normal.    ED Course  Procedures (including critical care time) Medications  0.9 %  sodium chloride infusion (not  administered)  sodium chloride 0.9 % bolus 1,000 mL (0 mLs Intravenous Stopped 04/02/12 2110)  ondansetron (ZOFRAN) injection 4 mg (4 mg Intravenous Given 04/02/12 1915)  HYDROmorphone (DILAUDID) injection 1 mg (1 mg Intravenous Given 04/02/12 2025)    Labs Reviewed  CBC WITH DIFFERENTIAL - Abnormal; Notable for the following:    Hemoglobin 11.7 (*)    HCT 35.9 (*)    RDW 17.6 (*)    All other components within normal limits  COMPREHENSIVE METABOLIC PANEL - Abnormal; Notable for the following:    Glucose, Bld 108 (*)    BUN 51 (*)    Creatinine, Ser 3.70 (*)    Albumin 3.3 (*)    AST 40 (*)    GFR calc non Af Amer 18 (*)    GFR calc Af Amer 21 (*)    All other components within normal limits  LIPASE, BLOOD   Dg Chest 2 View  04/02/2012  *RADIOLOGY REPORT*  Clinical Data: 47 year old male weakness cough shortness of breath bilateral lower extremity swelling.  CHEST - 2 VIEW  Comparison: 03/23/2012 and earlier.  Findings: Lung volumes are stable and within normal limits. Cardiac size at the upper limits of normal. Other mediastinal contours are within normal limits.  Visualized tracheal air column is within normal limits.  Lung parenchyma stable and clear.  No pneumothorax or effusion. No acute osseous abnormality identified. Mild respiratory motion artifact on the lateral view.  IMPRESSION: No acute cardiopulmonary abnormality.   Original Report Authenticated By: Roselyn Reef, M.D.    Results for orders placed during the hospital encounter of 04/02/12  CBC WITH DIFFERENTIAL      Result Value Range   WBC 8.9  4.0 - 10.5 K/uL   RBC 4.35  4.22 - 5.81  MIL/uL   Hemoglobin 11.7 (*) 13.0 - 17.0 g/dL   HCT 35.9 (*) 39.0 - 52.0 %   MCV 82.5  78.0 - 100.0 fL   MCH 26.9  26.0 - 34.0 pg   MCHC 32.6  30.0 - 36.0 g/dL   RDW 17.6 (*) 11.5 - 15.5 %   Platelets 214  150 - 400 K/uL   Neutrophils Relative 62  43 - 77 %   Neutro Abs 5.5  1.7 - 7.7 K/uL   Lymphocytes Relative 27  12 - 46 %   Lymphs Abs 2.4  0.7 - 4.0 K/uL   Monocytes Relative 8  3 - 12 %   Monocytes Absolute 0.7  0.1 - 1.0 K/uL   Eosinophils Relative 3  0 - 5 %   Eosinophils Absolute 0.2  0.0 - 0.7 K/uL   Basophils Relative 0  0 - 1 %   Basophils Absolute 0.0  0.0 - 0.1 K/uL  COMPREHENSIVE METABOLIC PANEL      Result Value Range   Sodium 137  135 - 145 mEq/L   Potassium 4.7  3.5 - 5.1 mEq/L   Chloride 102  96 - 112 mEq/L   CO2 23  19 - 32 mEq/L   Glucose, Bld 108 (*) 70 - 99 mg/dL   BUN 51 (*) 6 - 23 mg/dL   Creatinine, Ser 3.70 (*) 0.50 - 1.35 mg/dL   Calcium 9.2  8.4 - 10.5 mg/dL   Total Protein 7.4  6.0 - 8.3 g/dL   Albumin 3.3 (*) 3.5 - 5.2 g/dL   AST 40 (*) 0 - 37 U/L   ALT 21  0 - 53 U/L   Alkaline Phosphatase 74  39 -  117 U/L   Total Bilirubin 0.5  0.3 - 1.2 mg/dL   GFR calc non Af Amer 18 (*) >90 mL/min   GFR calc Af Amer 21 (*) >90 mL/min  LIPASE, BLOOD      Result Value Range   Lipase 51  11 - 59 U/L     1. Viral illness   2. Gastroenteritis   3. Upper respiratory infection       MDM  Workup in the emergency department most likely consistent with a viral gastroenteritis and viral upper rest for infection. No evidence of pneumonia on the chest x-ray no significant leukocytosis. Patient is nontoxic no acute distress.  I personally performed the services described in this documentation, which was scribed in my presence. The recorded information has been reviewed and is accurate.         Mervin Kung, MD 04/02/12 2112

## 2012-04-02 NOTE — ED Notes (Signed)
Pt given crackers and sprite.

## 2012-04-06 ENCOUNTER — Ambulatory Visit (HOSPITAL_COMMUNITY): Admission: RE | Admit: 2012-04-06 | Payer: Medicare Other | Source: Ambulatory Visit

## 2012-04-14 IMAGING — NM NM PULMONARY VENT & PERF
2 series · 12 of 12 positions shown · non-contrast
Comparison: V/Q scan performed 05/08/2010, and chest radiograph
performed earlier today at [DATE] p.m.

CLINICAL DATA: Chest pain and shortness of breath; tachycardia.
Dizziness.

NUCLEAR MEDICINE VENTILATION - PERFUSION LUNG SCAN
TECHNIQUE: Single-breath inhalation, equilibrium, and wash-out
phase ventilation images were obtained using 5e-9JJ gas.  Perfusion
images were obtained in multiple projections after intravenous
injection of Zc-22m MAA.
Radiopharmaceuticals:  12.8 mCi 5e-9JJ gas and 5.36 mCi Zc-22m MAA.

[lung vq · 3.20mm/px · 6 of 16 frames shown (1 of 2)]
[frame 2/16  full-range]
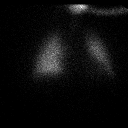
[frame 4/16  full-range]
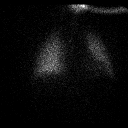
[frame 7/16  full-range]
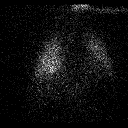
[frame 10/16  full-range]
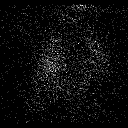
[frame 12/16  full-range]
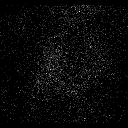
[frame 15/16]
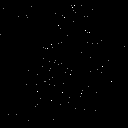

[lung vq · 3.20mm/px · 6 of 16 frames shown (2 of 2)]
[frame 2/16  full-range]
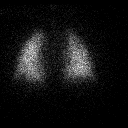
[frame 4/16  full-range]
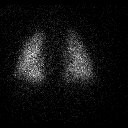
[frame 7/16  full-range]
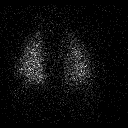
[frame 10/16]
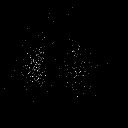
[frame 12/16  full-range]
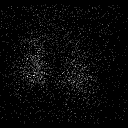
[frame 15/16]
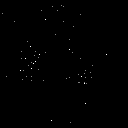

[12 of 12 positions shown; findings below may reference images not displayed]

FINDINGS: Ventilation images demonstrate normal accumulation of
activity within both lungs on single breath inhalation.  The
equilibrium images are grossly unremarkable in appearance.  On
washout, there is suggestion of very mild right-sided airspace
trapping.

Perfusion images demonstrate slight heterogeneity of perfusion
within the left lung, without significant focal perfusion defect.
An apparent small defect of perfusion at the right lung apex is
thought to reflect overlying structures.  No significant focal
segmental defect is identified within either lung.

Corresponding chest radiograph demonstrates no focal abnormality.
IMPRESSION: Near-normal V/Q scan; suggestion of very mild right-sided airspace
trapping.

## 2012-04-14 IMAGING — CR DG CHEST 1V PORT
1 series · 1 of 1 positions shown · non-contrast
Comparison: 08/21/2010

CLINICAL DATA: Chest pain

PORTABLE CHEST - 1 VIEW

[view not recorded]
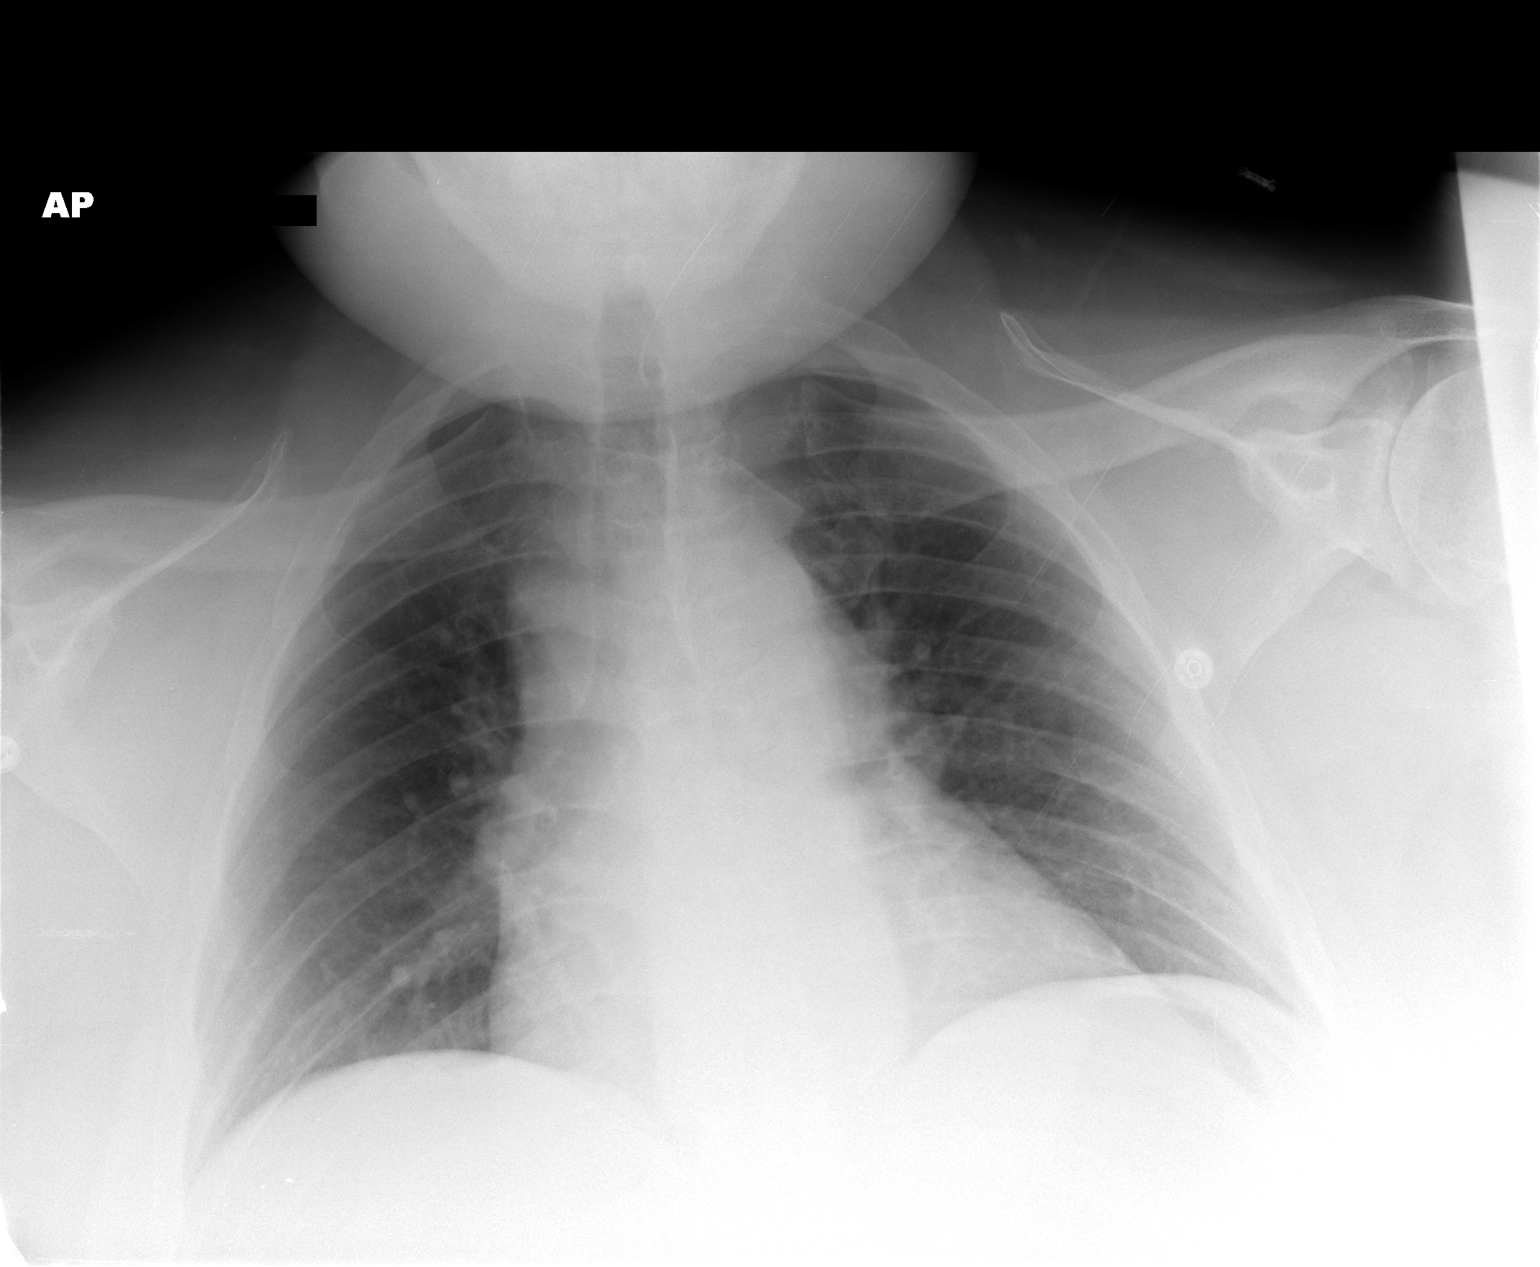

[1 of 1 positions shown; findings below may reference images not displayed]

FINDINGS: Heart size is normal.  Mediastinal shadows are normal.
The vascularity is normal.  Lungs are clear.  No effusions.  No
bony abnormalities.
IMPRESSION: No active disease

## 2012-04-15 ENCOUNTER — Ambulatory Visit (HOSPITAL_COMMUNITY)
Admission: RE | Admit: 2012-04-15 | Discharge: 2012-04-15 | Disposition: A | Discharge status: Home / Self Care | Payer: Medicare Other | Source: Ambulatory Visit | Attending: Internal Medicine | Admitting: Internal Medicine

## 2012-04-15 DIAGNOSIS — M25569 Pain in unspecified knee: Secondary | ICD-10-CM | POA: Insufficient documentation

## 2012-04-15 DIAGNOSIS — R2689 Other abnormalities of gait and mobility: Secondary | ICD-10-CM | POA: Diagnosis present

## 2012-04-15 DIAGNOSIS — IMO0001 Reserved for inherently not codable concepts without codable children: Secondary | ICD-10-CM | POA: Insufficient documentation

## 2012-04-15 DIAGNOSIS — R262 Difficulty in walking, not elsewhere classified: Secondary | ICD-10-CM | POA: Diagnosis present

## 2012-04-15 DIAGNOSIS — M109 Gout, unspecified: Secondary | ICD-10-CM | POA: Insufficient documentation

## 2012-04-15 DIAGNOSIS — M6281 Muscle weakness (generalized): Secondary | ICD-10-CM | POA: Insufficient documentation

## 2012-04-15 DIAGNOSIS — R279 Unspecified lack of coordination: Secondary | ICD-10-CM | POA: Insufficient documentation

## 2012-04-15 DIAGNOSIS — R29898 Other symptoms and signs involving the musculoskeletal system: Secondary | ICD-10-CM | POA: Diagnosis present

## 2012-04-15 NOTE — Evaluation (Signed)
Physical Therapy Evaluation  Patient Details  Name: Jack Irwin MRN: [id number] Date of Birth: 01-12-1966  Today's Date: 04/15/2012 Time: 1020-1108 PT Time Calculation (min): 48 min Charges: 1 evaluation, 10' Self Care             Visit#: 1 of 8  Re-eval: 05/15/12 Assessment Diagnosis: Generalized weakness Next MD Visit: Dr,. Avbuere - March 31st Prior Therapy: For generalized weakness  Authorization: MEDICARE    Authorization Time Period:    Authorization Visit#: 1 of 10   Past Medical History:  Past Medical History  Diagnosis Date  . Hypertension   . Diabetes mellitus   . Gout   . Chronic back pain   . BPH (benign prostatic hyperplasia)   . Anemia   . DVT (deep venous thrombosis)   . Neuropathy   . Decubitus ulcer     of 2nd toes of both feet.  . Edema leg   . Constipation   . Venous (peripheral) insufficiency   . Neuropathy, diabetic   . Physical deconditioning   . Renal insufficiency   . Chronic kidney disease   . Chronic kidney disease (CKD), stage IV (severe)    Past Surgical History:  Past Surgical History  Procedure Laterality Date  . None      Subjective Symptoms/Limitations Pertinent History: Pt returns to PT for generalized weakness secondary to pain from periphreal neuropathy and gouty arthritis to hands and feet and is currently ambulating independently for short distances.  he reports that he is always in pain, but is not focused on that for now and wants to focus on getting better. and becoming more independent  He is motivated due to his wife leaving him and wants to be able to be independent to improve his overall quality of life. C/o is decreased strength, poor posture when walking and only able to walk short distances.  He states that his w/c has been broken for a month and has had to walk independently to get places and uses a scooter when he goes to the grocery store.   How long can you stand comfortably?: 2-3 minutes How long can you  walk comfortably?: 2-3 minutes  Special Tests: Functional Assessment Questionairre, patient scored 41/64 = 64% independent level. Patient Stated Goals: Pt wants to be able walk longer and stand longer.  Pain Assessment Currently in Pain?: No/denies Pain Score: 0-No pain  Balance Screening Balance Screen Has the patient fallen in the past 6 months: No Has the patient had a decrease in activity level because of a fear of falling? : Yes Is the patient reluctant to leave their home because of a fear of falling? 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 Comments: Enjoy being social, going to football games, bowling, swimming, shopping  Cognition/Observation Cognition Overall Cognitive Status: Appears within functional limits for tasks assessed Observation/Other Assessments Observations: impaired rt hamstring length with 8 degree knee flexion contracture Other Assessments: both heels 5 inches from wall with back flat on wall  Sensation/Coordination/Flexibility/Functional Tests Sensation Light Touch: Appears Intact Coordination Gross Motor Movements are Fluid and Coordinated: Yes 9 Hole Peg Test: right 25.8" left 1'11.2" using compensatory shoulder movements  Functional Tests Functional Tests: 5 STS from lowered blue mat at 20 inches: 14.13 seconds  Assessment RUE AROM (degrees) RUE Overall AROM Comments: shoulder  AROM is 60% which is functional for patient, elbow-wrist is [facility]. RUE Strength RUE Overall Strength Comments: 4+/5 in given range Grip (lbs): 25 Lateral Pinch: 10 lbs 3 Point Pinch: 10 lbs LUE AROM (degrees) LUE Overall AROM Comments: shoulder is 60%, which is normal for patient, elbow - wrist WFL LUE Strength LUE Overall Strength Comments: 4+/5 Grip (lbs): 11 Lateral  Pinch: 12 lbs 3 Point Pinch: 4 lbs RLE AROM (degrees) Right Knee Extension: 8 RLE Strength RLE Overall Strength Comments: taken in seated position Right Hip Flexion: 4/5 Right Hip Extension: 3-/5 Right Hip ABduction: 4/5 Right Hip ADduction: 3+/5 Right Knee Flexion: 3+/5 Right Knee Extension: 4/5 LLE Strength LLE Overall Strength Comments: taken in seated position Left Hip Flexion: 4/5 Left Hip Extension: 3-/5 Left Hip ABduction: 4/5 Left Hip ADduction: 4/5 Left Knee Flexion: 5/5 Left Knee Extension: 5/5  Mobility/Balance  Ambulation/Gait Ambulation/Gait: Yes Ambulation/Gait Assistance: 7: Independent Ambulation Distance (Feet): 250 Feet (4 minutes 15 second; 2 minute walk: 98 feet) Assistive device: None Gait Pattern: Trunk flexed;Right flexed knee in stance;Lateral hip instability;Decreased hip/knee flexion - right Stairs: Yes Stairs Assistance: 6: Modified independent (Device/Increase time) Stair Management Technique: Two rails;Step to pattern;Forwards;Backwards Number of Stairs: 3 Height of Stairs: 4 Posture/Postural Control Posture/Postural Control: Postural limitations Postural Limitations: trunk flexion during gait: 40 degrees; AROM trunk flexion while standing: 20 degrees Dynamic Gait Index Level Surface: Mild Impairment Change in Gait Speed: Moderate Impairment Gait with Horizontal Head Turns: Moderate Impairment Gait with Vertical Head Turns: Moderate Impairment Gait and Pivot Turn: Moderate Impairment Step Over Obstacle: Moderate Impairment Step Around Obstacles: Mild Impairment Steps: Severe Impairment Total Score: 9 Timed Up and Go Test TUG: Normal TUG Normal TUG (seconds): 15.77 (independent)   Exercise/Treatments Stretches Hip Flexor Stretch: 1 rep;60 seconds;Limitations Hip Flexor Stretch Limitations: bilateral LE Supine Quad Sets: Both;5 reps Bridges: 10 reps  Physical Therapy Assessment and Plan PT Assessment and Plan Clinical Impression  Statement: Pt is a 47 year old male referred to PT for generalized weakness and difficulty walking secondary to periphreal neuropathy and gouty arthritis of hands and feet and has a long standing history of attending physical therapy.  After evaluation it is found that he has a new found motivation to be able to perform ADL's and IADL's on his own and would like to seek conseuling for appropriate nutrition.  At this time he is able to ambulate 4 minutes 15 seconds with significant impairements to his gait mechanics.  Pt will benefit from skilled therapeutic intervention in order to improve on the following deficits: Decreased activity tolerance;Decreased balance;Decreased mobility;Decreased range of motion;Decreased strength;Difficulty walking;Pain;Improper body mechanics Rehab Potential: Fair Clinical Impairments Affecting Rehab Potential: secondary to requiring significant motivation to complete HEP PT Frequency: Min 2X/week PT Duration: 4 weeks PT Treatment/Interventions: Gait training;Stair training;Functional mobility training;Therapeutic activities;Therapeutic exercise;Balance training;Neuromuscular re-education;Patient/family education PT Plan: Pt does not wish to focus on pain.  Continue with ambulating in open space or on TM to improve motivation. Nustep for activity tolerance.  Continue to encourage HEP.  Focus on improving core strength, standing posture, heel strike, rt knee fleixon during gait    Goals Home Exercise Program Pt will Perform Home Exercise Program: Independently PT Goal: Perform Home Exercise Program - Progress: Goal set today PT Short Term Goals Time to Complete Short Term Goals: 2 weeks PT Short Term Goal 1: Pt will improve his activity tolerance and ambulate independently for 10 minutes without rest break with moderate cueing for appropriate posture.  PT Short  Term Goal 2: Pt will improve his BLE knee flexibility and demonstrate knee flexion to 3 degrees when standing  for improved body mechanics when walking.  PT Short Term Goal 3: Pt will improve his TUG to 13 seconds for safety while walking in the community.   PT Short Term Goal 4: Pt will improve his activity tolerance and ambulate x30 minutes with trunk flexion less than 10 degrees in order to ambulate safely in the community to improve QOL.   PT Long Term Goals Time to Complete Long Term Goals: 4 weeks PT Long Term Goal 1: Pt will improve his BLE strength to [facility] in order to ascend and descend stairs forward with 1 handrail with step to pattern in order to safely enter family and friends home. PT Long Term Goal 2: Pt will improve his gait speed and ambulate 700 feet in two minutes for age approrpriate speed.  Long Term Goal 3: Pt will improve his DGI to 14/24 for improved safety with ambulating in the community.  Long Term Goal 4: Pt will improve his FAQ to 50/64 for improved percieved functional ability.  PT Long Term Goal 5: Pt will improve his core strength in order to tolerate standing for greater than 15 minutes in order to independently take a shower and perform self care activities in the bathroom.   Problem List Patient Active Problem List  Diagnosis  . Difficulty in walking  . Weakness of both legs  . Poor balance    PT Plan of Care PT Home Exercise Plan: see scanned report PT Patient Instructions: about HEP and continuing with safe ambulation at home.  Consulted and Agree with Plan of Care: Patient  GP Functional Assessment Tool Used: FAQ: 64% Functional Limitation: Mobility: Walking and moving around Mobility: Walking and Moving Around Current Status [id number]): At least 60 percent but less than 80 percent impaired, limited or restricted Mobility: Walking and Moving Around Goal Status [id number]): At least 20 percent but less than 40 percent impaired, limited or restricted  LISA MASSIE, PT 04/15/2012, 1:24 PM  Physician Documentation Your signature is required to indicate approval of the  treatment plan as stated above.  Please sign and either send electronically or make a copy of this report for your files and return this physician signed original.   Please mark one 1.__approve of plan  2. ___approve of plan with the following conditions.   ______________________________                                                          _____________________ Physician Signature                                                                                                             Date

## 2012-04-15 NOTE — Evaluation (Addendum)
Occupational Therapy Evaluation  Patient Details  Name: Jack Irwin MRN: [id number] Date of Birth: 06-20-1965  Today's Date: 04/15/2012 Time: [id number] OT Time Calculation (min): 40 min OT Evaluation 30' HEP instruction/self care 10' Visit#: 1 of 12  Re-eval: 05/13/12     Authorization: Medicare  Authorization Time Period: before 10th visit  Authorization Visit#: 1 of 10   Past Medical History:  Past Medical History  Diagnosis Date  . Hypertension   . Diabetes mellitus   . Gout   . Chronic back pain   . BPH (benign prostatic hyperplasia)   . Anemia   . DVT (deep venous thrombosis)   . Neuropathy   . Decubitus ulcer     of 2nd toes of both feet.  . Edema leg   . Constipation   . Venous (peripheral) insufficiency   . Neuropathy, diabetic   . Physical deconditioning   . Renal insufficiency   . Chronic kidney disease   . Chronic kidney disease (CKD), stage IV (severe)    Past Surgical History:  Past Surgical History  Procedure Laterality Date  . None      Subjective S:  I want to be able to stand up in the shower. Pertinent History: Jack Irwin has been referred to occupational therapy for BUE strengthening secondary to diagnosis of diabetic neuropathy and gouty arthritis.  He is very motivated to participate in therapy and become more independent and able to care for himself in order to take some of the household burden off of his wife.   Special Tests: Functional Assessment Questionairre, patient scored 41/64 = 64% independent level. Pain Assessment Currently in Pain?: No/denies Pain Score: 0-No pain  Precautions/Restrictions   n/a  Balance Screening Balance Screen Has the patient fallen in the past 6 months: No Has the patient had a decrease in activity level because of a fear of falling? : Yes Is the patient reluctant to leave their home because of a fear of falling? 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  He is able to shower and uses a shower chair.  He would like to be able to stand in the shower.  He is able to help with folding laundry.  He is walking without assistive device.     Cognition  Overall Cognitive Status Appears within functional limits for tasks assessed  Observation/Other Assessments  Observations impaired rt hamstring length with 8 degree knee flexion contracture  Other Assessments both heels 5 inches from wall with back flat on wall  Sensation  Light Touch Appears Intact  Coordination  Gross Motor Movements are Fluid and Coordinated Yes  9 Hole Peg Test right 25.8" left 1'11.2" using compensatory shoulder movements   RUE AROM (degrees)  RUE Overall AROM Comments shoulder AROM is 60% which is functional for patient, elbow-wrist is [facility].  RUE Strength  RUE Overall Strength Comments 4+/5 in given range  Grip (lbs) 25  Lateral Pinch 10 lbs  3 Point Pinch 10 lbs  LUE AROM (degrees)  LUE Overall AROM Comments shoulder is 60%, which is normal for patient, elbow - wrist [facility]  LUE Strength  LUE Overall Strength Comments 4+/5  Lateral Pinch 12 lbs  3 Point Pinch 4 lbs  Grip (lbs) 11  Written Expression  Dominant Hand Right      Cognition/Observation Observation/Other Assessments Observations: Upper back weakness causing patient to flex forward when walking  Exercise/Treatments Hand Exercises Theraputty - Flatten: yellow Theraputty - Roll: yellow Theraputty - Grip: yellow        Occupational Therapy Assessment and Plan OT Assessment and Plan Clinical Impression Statement: A:  Patient presents with decreased independence with  BADLs, IADLs, and leisure activities due to decreased strength, coordination, scapular stability, activity tolerance.  Patient will benefit from skilled OT intervention to improve in these identified areas, working towards his goal of caring for himself.  Pt will benefit from skilled therapeutic intervention in order to improve on the following deficits: Decreased activity tolerance;Decreased balance;Decreased strength;Impaired UE functional use;Decreased coordination Rehab Potential: Good OT Frequency: Min 2X/week OT Duration: 6 weeks OT Treatment/Interventions: Self-care/ADL training;Therapeutic exercise;Energy conservation;DME and/or AE instruction;Therapeutic activities;Patient/family education;Balance training OT Plan: P:  Skilled OT intervention to increase his strength, coordination, balance, sustained activity tolerance, for increased independence with all B/IADLs and leisure activities.  Treatment Plan:  Nustep for BUE sustained activity tolerance and scapular stability, tband exercises for scapular region, activities to improve grip and pinch strength, [facility] training, dynamic standing balance activities in order to stand in the shower, educated on AE and DME and energy conservation techniques.    Goals Home Exercise Program Pt will Perform Home Exercise Program: Independently PT Goal: Perform Home Exercise Program - Progress: Goal set today Short Term Goals Time to Complete Short Term Goals: 3 weeks Short Term Goal 1: Patient will be educated on a HEP. Short Term Goal 2: Patient will increase upper back strength/scapular stability from fair to good for increased upright posture when walking. Short Term Goal 3: Patient will be able to complete BUE and BLE bathing activities in standing position. Short Term Goal 4: Patient will increase his sustained activity tolerance from fair to fair + for greater ability to complete laundry tasks. Short Term Goal 5: Patient will increase bilateral grip  strength by 5 pounds and pinch strength by 2 pounds for increased ability to open containers. Additional Short Term Goals?: Yes Short Term Goal 6: Patient will increase fine motor coordination for increased independence opening toothpaste by decreasing completion time on Nine Hole Peg Test with his left hand by 20'. Long Term Goals Time to Complete Long Term Goals: 6 weeks Long Term Goal 1: Patient will improve his functional independence level to highest possible, demonstrated by increasing his score on the Functional Assessment Questionairre to 80% or better.  Long Term Goal 2: Patient will be able to fasten buttons, tie shoes, manipulate zippers on his clothing independently. Long Term Goal 3: Patient will increase his upper back/scapular strength to good + for increased upright posture when walking.  Long Term Goal 4: Patient will increase his grip strength by 15 pounds and pinch strength by 5 pounds for increased ability to open containers. Long Term Goal 5: Patient will increase fine motor coordination needed to fasten buttons and tie shoes by decreasing completion time on Nine Hole Peg Test to 40" or less.   Additional Long Term Goals?: Yes Long Term Goal 6: Patient will have good - activity tolerance for greater participation in IADLs.   Problem List Patient Active Problem List  Diagnosis  . Difficulty in walking  . Weakness of both legs  .  Poor balance  . Lack of coordination  . Muscle weakness (generalized)  . Arthritis, gouty    End of Session Activity Tolerance: Patient tolerated treatment well General Behavior During Session: [facility] for tasks performed Cognition: [facility] for tasks performed OT Plan of Care OT Home Exercise Plan: Educated patient on HEP for tendon glides, active hand exercises, and yellow putty for grip strengthening . Consulted and Agree with Plan of Care: Patient  GO Functional Assessment Tool Used: Functional Assessment Questionnaire scored 64%  Independent Functional Limitation: Self care Self Care Current Status [id number]): At least 20 percent but less than 40 percent impaired, limited or restricted Self Care Goal Status [id number]): At least 1 percent but less than 20 percent impaired, limited or restricted  Vangie Bicker, OTR/L  04/15/2012, 2:12 PM  Physician Documentation Your signature is required to indicate approval of the treatment plan as stated above.  Please sign and either send electronically or make a copy of this report for your files and return this physician signed original.  Please mark one 1.__approve of plan  2. ___approve of plan with the following conditions.   ______________________________                                                          _____________________ Physician Signature                                                                                                             Date

## 2012-04-19 ENCOUNTER — Telehealth (HOSPITAL_COMMUNITY): Payer: Self-pay | Admitting: Dietician

## 2012-04-19 NOTE — Telephone Encounter (Signed)
Spoke with outpatient PT on 04/15/12 and PT reported pt would benefit from nutrition education. Chart reviewed. Pt with hx of DM and currently undergoing PT/OT services. Pt is eligible to attend free diabetes education class at [facility]. Will provide pt with information re: group diabetes class.

## 2012-04-21 ENCOUNTER — Ambulatory Visit (HOSPITAL_COMMUNITY)
Admission: RE | Admit: 2012-04-21 | Discharge: 2012-04-21 | Disposition: A | Discharge status: Home / Self Care | Payer: Medicare Other | Source: Ambulatory Visit | Attending: Internal Medicine | Admitting: Internal Medicine

## 2012-04-21 DIAGNOSIS — M109 Gout, unspecified: Secondary | ICD-10-CM

## 2012-04-21 DIAGNOSIS — R279 Unspecified lack of coordination: Secondary | ICD-10-CM

## 2012-04-21 DIAGNOSIS — M6281 Muscle weakness (generalized): Secondary | ICD-10-CM

## 2012-04-21 NOTE — Progress Notes (Signed)
Physical Therapy Treatment Patient Details  Name: Jack Irwin MRN: [id number] Date of Birth: January 03, 1966  Today's Date: 04/21/2012 Time: [id number] PT Time Calculation (min): 40 min Charge: Gait 10', self care x 10'. therex 84'  Visit#: 2 of 8  Re-eval: 05/15/12    Authorization: MEDICARE  Authorization Time Period:    Authorization Visit#: 2 of 10   Subjective: Symptoms/Limitations Symptoms: OT session started on Nustep, pt c/o pain overall but not going to let the pain stop him today.   Patient Stated Goals: Pt wants to walk daughter down beauty show. Pain Assessment Pain Score:   4 Pain Location: Knee (hands and knees)  Objective :  Exercise/Treatments Aerobic Tread Mill: Gait training  @  1.0-->1.4 10'  total; rest breaks at 2'40", 7' and 10' Seated Other Seated Knee Exercises: 5 STS with foam and 1 HHA to control descend Supine Other Supine Knee Exercises: Self care education on importance of completeing HEP, Cueing for hand and foot placement for safety and strengthening x 10' Standing:  Functional squats 10x    Physical Therapy Assessment and Plan PT Assessment and Plan Clinical Impression Statement: Gait training complete to improve mechanics with mulitmodal cueing required to improve gait sequencing, posture and heel to toe pattern.  Pt with increased difficulty descending due to weakness, educated importance of controling descent with sit to stand, hand and foot placement to increase ease and safety. PT Plan: Pt does not wish to focus on pain.  Continue with ambulating in open space or on TM to improve motivation. Nustep for activity tolerance.  Continue to encourage HEP.  Focus on improving core strength, standing posture, heel strike, rt knee fleixon during gait    Goals    Problem List Patient Active Problem List  Diagnosis  . Difficulty in walking  . Weakness of both legs  . Poor balance  . Lack of coordination  . Muscle weakness (generalized)   . Arthritis, gouty    PT - End of Session Equipment Utilized During Treatment: Gait belt Activity Tolerance: Patient tolerated treatment well;Patient limited by fatigue General Behavior During Session: [facility] for tasks performed Cognition: [facility] for tasks performed  GP    Aldona Lento 04/21/2012, 3:33 PM

## 2012-04-21 NOTE — Progress Notes (Signed)
Occupational Therapy Treatment Patient Details  Name: Jack Irwin MRN: [id number] Date of Birth: February 13, 1965  Today's Date: 04/21/2012 Time: 1400-1430 OT Time Calculation (min): 30 min Therapeutic exercises 30' Visit#: 2 of 12  Re-eval: 05/13/12    Authorization: Medicare  Authorization Time Period: before 10th visit  Authorization Visit#: 2 of 10  Subjective Symptoms/Limitations Symptoms: S:  I cant tie my shoes, can you tighten them for me? Pain Assessment Currently in Pain?: No/denies Pain Score:   4 Pain Location: Knee (hands and knees)  Precautions/Restrictions     Exercise/Treatments Seated Extension: Theraband;10 reps Theraband Level (Shoulder Extension): Level 2 (Red) Retraction: Theraband;10 reps Theraband Level (Shoulder Retraction): Level 2 (Red) ROM / Strengthening / Isometric Strengthening Other ROM/Strengthening Exercises: Nustep level 1 for 10 minutes to improve BUE sustained activity tolerance went .4 miles in 10 minutes   Hand Exercises Theraputty - Flatten: pink Theraputty - Roll: pink Theraputty - Grip: pink Sponges: right 15, 13 left 12, 12     Activities of Daily Living Activities of Daily Living: educated patient on the use of elastic shoelaces in shoes to increase independence with tying his shoes or to purchase velcro shoes. Patient interested in elastic laces and is looking into purchasing them.   Occupational Therapy Assessment and Plan OT Assessment and Plan Clinical Impression Statement: A:  Patient enjoyed the Nustep and the challenge it provided to his BUE AROM and sustained activity tolerance. OT Plan: P:  Add grip strengthening with hand gripper and large beads.     Goals Short Term Goals Time to Complete Short Term Goals: 3 weeks Short Term Goal 1: Patient will be educated on a HEP. Short Term Goal 1 Progress: Progressing toward goal Short Term Goal 2: Patient will increase upper back strength/scapular stability from fair to  good for increased upright posture when walking. Short Term Goal 2 Progress: Progressing toward goal Short Term Goal 3: Patient will be able to complete BUE and BLE bathing activities in standing position. Short Term Goal 3 Progress: Progressing toward goal Short Term Goal 4: Patient will increase his sustained activity tolerance from fair to fair + for greater ability to complete laundry tasks. Short Term Goal 4 Progress: Progressing toward goal Short Term Goal 5: Patient will increase bilateral grip strength by 5 pounds and pinch strength by 2 pounds for increased ability to open containers. Short Term Goal 5 Progress: Progressing toward goal Additional Short Term Goals?: Yes Short Term Goal 6: Patient will increase fine motor coordination for increased independence opening toothpaste by decreasing completion time on Nine Hole Peg Test with his left hand by 20'. Short Term Goal 6 Progress: Progressing toward goal Long Term Goals Time to Complete Long Term Goals: 6 weeks Long Term Goal 1: Patient will improve his functional independence level to highest possible, demonstrated by increasing his score on the Functional Assessment Questionairre to 80% or better.  Long Term Goal 1 Progress: Progressing toward goal Long Term Goal 2: Patient will be able to fasten buttons, tie shoes, manipulate zippers on his clothing independently. Long Term Goal 3: Patient will increase his upper back/scapular strength to good + for increased upright posture when walking.  Long Term Goal 3 Progress: Progressing toward goal Long Term Goal 4: Patient will increase his grip strength by 15 pounds and pinch strength by 5 pounds for increased ability to open containers. Long Term Goal 4 Progress: Progressing toward goal Long Term Goal 5: Patient will increase fine motor coordination needed to fasten buttons  and tie shoes by decreasing completion time on Nine Hole Peg Test to 40" or less.   Long Term Goal 5 Progress:  Progressing toward goal Additional Long Term Goals?: Yes Long Term Goal 6: Patient will have good - activity tolerance for greater participation in IADLs.  Long Term Goal 6 Progress: Progressing toward goal  Problem List Patient Active Problem List  Diagnosis  . Difficulty in walking  . Weakness of both legs  . Poor balance  . Lack of coordination  . Muscle weakness (generalized)  . Arthritis, gouty    End of Session Activity Tolerance: Patient tolerated treatment well General Behavior During Session: [facility] for tasks performed Cognition: [facility] for tasks performed  [place], OTR/L  04/21/2012, 3:51 PM

## 2012-04-23 ENCOUNTER — Emergency Department (HOSPITAL_COMMUNITY)
Admission: EM | Admit: 2012-04-23 | Discharge: 2012-04-23 | Disposition: A | Discharge status: Home / Self Care | Payer: Medicare Other | Attending: Emergency Medicine | Admitting: Emergency Medicine

## 2012-04-23 ENCOUNTER — Emergency Department (HOSPITAL_COMMUNITY): Payer: Medicare Other

## 2012-04-23 ENCOUNTER — Encounter (HOSPITAL_COMMUNITY): Payer: Self-pay | Admitting: *Deleted

## 2012-04-23 ENCOUNTER — Inpatient Hospital Stay (HOSPITAL_COMMUNITY): Admission: RE | Admit: 2012-04-23 | Payer: Medicare Other | Source: Ambulatory Visit | Admitting: Physical Therapy

## 2012-04-23 DIAGNOSIS — N184 Chronic kidney disease, stage 4 (severe): Secondary | ICD-10-CM | POA: Insufficient documentation

## 2012-04-23 DIAGNOSIS — I129 Hypertensive chronic kidney disease with stage 1 through stage 4 chronic kidney disease, or unspecified chronic kidney disease: Secondary | ICD-10-CM | POA: Insufficient documentation

## 2012-04-23 DIAGNOSIS — N342 Other urethritis: Secondary | ICD-10-CM

## 2012-04-23 DIAGNOSIS — R071 Chest pain on breathing: Secondary | ICD-10-CM | POA: Insufficient documentation

## 2012-04-23 DIAGNOSIS — IMO0001 Reserved for inherently not codable concepts without codable children: Secondary | ICD-10-CM | POA: Insufficient documentation

## 2012-04-23 DIAGNOSIS — E1142 Type 2 diabetes mellitus with diabetic polyneuropathy: Secondary | ICD-10-CM | POA: Insufficient documentation

## 2012-04-23 DIAGNOSIS — Z8719 Personal history of other diseases of the digestive system: Secondary | ICD-10-CM | POA: Insufficient documentation

## 2012-04-23 DIAGNOSIS — E1149 Type 2 diabetes mellitus with other diabetic neurological complication: Secondary | ICD-10-CM | POA: Insufficient documentation

## 2012-04-23 DIAGNOSIS — M7918 Myalgia, other site: Secondary | ICD-10-CM

## 2012-04-23 DIAGNOSIS — Z791 Long term (current) use of non-steroidal anti-inflammatories (NSAID): Secondary | ICD-10-CM | POA: Insufficient documentation

## 2012-04-23 DIAGNOSIS — R0789 Other chest pain: Secondary | ICD-10-CM

## 2012-04-23 DIAGNOSIS — Z86718 Personal history of other venous thrombosis and embolism: Secondary | ICD-10-CM | POA: Insufficient documentation

## 2012-04-23 DIAGNOSIS — Z872 Personal history of diseases of the skin and subcutaneous tissue: Secondary | ICD-10-CM | POA: Insufficient documentation

## 2012-04-23 DIAGNOSIS — Z79899 Other long term (current) drug therapy: Secondary | ICD-10-CM | POA: Insufficient documentation

## 2012-04-23 DIAGNOSIS — M109 Gout, unspecified: Secondary | ICD-10-CM | POA: Insufficient documentation

## 2012-04-23 DIAGNOSIS — Z862 Personal history of diseases of the blood and blood-forming organs and certain disorders involving the immune mechanism: Secondary | ICD-10-CM | POA: Insufficient documentation

## 2012-04-23 DIAGNOSIS — Z87448 Personal history of other diseases of urinary system: Secondary | ICD-10-CM | POA: Insufficient documentation

## 2012-04-23 DIAGNOSIS — Z8679 Personal history of other diseases of the circulatory system: Secondary | ICD-10-CM | POA: Insufficient documentation

## 2012-04-23 HISTORY — DX: Difficulty in walking, not elsewhere classified: R26.2

## 2012-04-23 LAB — CBC WITH DIFFERENTIAL/PLATELET
Basophils Absolute: 0 10*3/uL (ref 0.0–0.1)
HCT: 39.4 % (ref 39.0–52.0)
Hemoglobin: 12.7 g/dL — ABNORMAL LOW (ref 13.0–17.0)
Lymphocytes Relative: 25 % (ref 12–46)
Monocytes Absolute: 0.6 10*3/uL (ref 0.1–1.0)
Monocytes Relative: 7 % (ref 3–12)
Neutro Abs: 5.8 10*3/uL (ref 1.7–7.7)
Neutrophils Relative %: 66 % (ref 43–77)
RDW: 17.6 % — ABNORMAL HIGH (ref 11.5–15.5)
WBC: 8.9 10*3/uL (ref 4.0–10.5)

## 2012-04-23 LAB — URINALYSIS, ROUTINE W REFLEX MICROSCOPIC
Bilirubin Urine: NEGATIVE
Nitrite: NEGATIVE
Specific Gravity, Urine: 1.025 (ref 1.005–1.030)
pH: 5.5 (ref 5.0–8.0)

## 2012-04-23 LAB — TROPONIN I: Troponin I: <0.3 ng/mL (ref <0.30)

## 2012-04-23 LAB — D-DIMER, QUANTITATIVE: D-Dimer, Quant: 2.12 ug/mL-FEU — ABNORMAL HIGH (ref 0.00–0.48)

## 2012-04-23 LAB — BASIC METABOLIC PANEL
Chloride: 100 mEq/L (ref 96–112)
GFR calc Af Amer: 24 mL/min — ABNORMAL LOW (ref >90)
GFR calc non Af Amer: 21 mL/min — ABNORMAL LOW (ref >90)
Potassium: 4.3 mEq/L (ref 3.5–5.1)

## 2012-04-23 LAB — URINE MICROSCOPIC-ADD ON

## 2012-04-23 MED ORDER — FENTANYL CITRATE 0.05 MG/ML IJ SOLN
25.0000 ug | Freq: Once | INTRAMUSCULAR | Status: DC
Start: 2012-04-23 — End: 2012-04-23

## 2012-04-23 MED ORDER — METHOCARBAMOL 500 MG PO TABS: 1000.0000 mg | ORAL_TABLET | Freq: Four times a day (QID) | ORAL | Status: DC | PRN

## 2012-04-23 MED ORDER — AZITHROMYCIN 250 MG PO TABS
1000.0000 mg | ORAL_TABLET | Freq: Once | ORAL | Status: AC
  Administered 2012-04-23: 1000 mg via ORAL
  Filled 2012-04-23: qty 4

## 2012-04-23 MED ORDER — TECHNETIUM TC 99M DIETHYLENETRIAME-PENTAACETIC ACID
35.0000 | Freq: Once | INTRAVENOUS | Status: AC | PRN
  Administered 2012-04-23: 35 via INTRAVENOUS

## 2012-04-23 MED ORDER — FENTANYL CITRATE 0.05 MG/ML IJ SOLN
50.0000 ug | Freq: Once | INTRAMUSCULAR | Status: AC
  Administered 2012-04-23: 50 ug via INTRAMUSCULAR
  Filled 2012-04-23: qty 2

## 2012-04-23 MED ORDER — TECHNETIUM TO 99M ALBUMIN AGGREGATED
6.0000 | Freq: Once | INTRAVENOUS | Status: AC | PRN
  Administered 2012-04-23: 6 via INTRAVENOUS

## 2012-04-23 MED ORDER — HYDROCODONE-ACETAMINOPHEN 5-325 MG PO TABS: ORAL_TABLET | ORAL | Status: DC

## 2012-04-23 MED ORDER — CEFTRIAXONE SODIUM 250 MG IJ SOLR
250.0000 mg | Freq: Once | INTRAMUSCULAR | Status: AC
  Administered 2012-04-23: 250 mg via INTRAMUSCULAR
  Filled 2012-04-23: qty 250

## 2012-04-23 NOTE — ED Notes (Signed)
Pain in chest, back, dysuria,, headache

## 2012-04-23 NOTE — ED Provider Notes (Signed)
History     CSN: [id number]  Arrival date & time 04/23/12  1523   First MD Initiated Contact with Patient 04/23/12 1552      Chief Complaint  Patient presents with  . Back Pain     HPI Pt was seen at 1555.   Per pt, c/o gradual onset and persistence of constant right sided chest wall "pain" for the past 2 to 3 days.  Has been associated with right sided mid-back "pain."  States the pain worsens with deep breath, palpation of the area, and body position changes.  Pt also c/o gradual onset and persistence of constant dysuria for the past several weeks.  Denies hematuria, no urinary frequency, no flank pain, no N/V/D, no fevers, no testicular pain/swelling, no palpitations, no SOB/cough, no injury.        Past Medical History  Diagnosis Date  . Hypertension   . Diabetes mellitus   . Gout   . Chronic back pain   . BPH (benign prostatic hyperplasia)   . Anemia   . DVT (deep venous thrombosis)   . Neuropathy   . Decubitus ulcer     of 2nd toes of both feet.  . Edema leg   . Constipation   . Venous (peripheral) insufficiency   . Neuropathy, diabetic   . Physical deconditioning   . Renal insufficiency   . Chronic kidney disease   . Chronic kidney disease (CKD), stage IV (severe)     Past Surgical History  Procedure Laterality Date  . None      Family History  Problem Relation Age of Onset  . Diabetes Mother   . Hypertension Mother   . Heart failure Mother   . Hyperlipidemia Mother   . Cancer Father   . Diabetes Father   . Hypertension Father   . Hyperlipidemia Father     History  Substance Use Topics  . Smoking status: Never Smoker   . Smokeless tobacco: Never Used  . Alcohol Use: No      Review of Systems ROS: Statement: All systems negative except as marked or noted in the HPI; Constitutional: Negative for fever and chills. ; ; Eyes: Negative for eye pain, redness and discharge. ; ; ENMT: Negative for ear pain, hoarseness, nasal congestion, sinus pressure  and sore throat. ; ; Cardiovascular: +CP. Negative for palpitations, diaphoresis, dyspnea and peripheral edema. ; ; Respiratory: Negative for cough, wheezing and stridor. ; ; Gastrointestinal: Negative for nausea, vomiting, diarrhea, abdominal pain, blood in stool, hematemesis, jaundice and rectal bleeding. . ; ; Genitourinary: +dysuria. Negative for flank pain and hematuria. ; ; Genital:  No penile drainage or rash, no testicular pain or swelling, no scrotal rash or swelling.;; Musculoskeletal: +back pain. Negative for neck pain. Negative for swelling and trauma.; ; Skin: Negative for pruritus, rash, abrasions, blisters, bruising and skin lesion.; ; Neuro: Negative for headache, lightheadedness and neck stiffness. Negative for weakness, altered level of consciousness , altered mental status, extremity weakness, paresthesias, involuntary movement, seizure and syncope.      Allergies  Review of patient's allergies indicates no known allergies.  Home Medications   Current Outpatient Rx  Name  Route  Sig  Dispense  Refill  . calcium acetate (PHOSLO) 667 MG capsule   Oral   Take 1,334 mg by mouth 3 (three) times daily with meals. BINDER         . cholecalciferol (VITAMIN D) 1000 UNITS tablet   Oral   Take 1,000 Units by mouth  daily.         . colchicine 0.6 MG tablet   Oral   Take 0.6 mg by mouth 2 (two) times daily as needed. For GOUT PAIN         . Dextromethorphan-Guaifenesin (MUCINEX DM) 30-600 MG TB12   Oral   Take 1 tablet by mouth every 12 (twelve) hours.   28 each   0   . Febuxostat (ULORIC) 80 MG TABS   Oral   Take 1 tablet by mouth daily as needed. For GOUT/HIGH URIC ACID         . furosemide (LASIX) 40 MG tablet   Oral   Take 1 tablet (40 mg total) by mouth daily.   5 tablet   0   . HYDROcodone-acetaminophen (NORCO/VICODIN) 5-325 MG per tablet   Oral   Take 1-2 tablets by mouth every 6 (six) hours as needed for pain.   14 tablet   0   . hyoscyamine (LEVSIN)  0.125 MG tablet   Oral   Take 1 tablet (0.125 mg total) by mouth every 4 (four) hours as needed for cramping.   30 tablet   0   . loperamide (IMODIUM A-D) 2 MG tablet   Oral   Take 1 tablet (2 mg total) by mouth 4 (four) times daily as needed for diarrhea or loose stools.   30 tablet   0   . metoprolol (LOPRESSOR) 50 MG tablet   Oral   Take 50 mg by mouth daily. For HEART         . ondansetron (ZOFRAN ODT) 4 MG disintegrating tablet   Oral   Take 1 tablet (4 mg total) by mouth every 8 (eight) hours as needed for nausea.   12 tablet   0   . ondansetron (ZOFRAN ODT) 4 MG disintegrating tablet   Oral   Take 1 tablet (4 mg total) by mouth every 8 (eight) hours as needed for nausea.   10 tablet   0   . oxyCODONE-acetaminophen (PERCOCET/ROXICET) 5-325 MG per tablet   Oral   Take 1-2 tablets by mouth every 4 (four) hours as needed for pain.   10 tablet   0   . vardenafil (LEVITRA) 20 MG tablet   Oral   Take 20 mg by mouth daily as needed. For sexual intercourse           BP 150/94  Pulse 65  Temp(Src) 97.6 F (36.4 C) (Oral)  Resp 18  Ht 5\' 11"  (1.803 m)  Wt 298 lb (135.172 kg)  BMI 41.58 kg/m2  SpO2 100%  Physical Exam 1600: Physical examination:  Nursing notes reviewed; Vital signs and O2 SAT reviewed;  Constitutional: Well developed, Well nourished, Well hydrated, In no acute distress; Head:  Normocephalic, atraumatic; Eyes: EOMI, PERRL, No scleral icterus; ENMT: Mouth and pharynx normal, Mucous membranes moist; Neck: Supple, Full range of motion, No lymphadenopathy; Cardiovascular: Regular rate and rhythm, No murmur, rub, or gallop; Respiratory: Breath sounds clear & equal bilaterally, No rales, rhonchi, wheezes.  Speaking full sentences with ease, Normal respiratory effort/excursion; Chest: +right upper anterior chest wall tenderness to palp. Movement normal; Abdomen: Soft, Nontender, Nondistended, Normal bowel sounds; Genitourinary: No CVA tenderness; Spine:  No  midline CS, TS, LS tenderness.  +TTP right thoracic paraspinal muscles.;; Extremities: Pulses normal, No tenderness, No edema, No calf edema or asymmetry.; Neuro: AA&Ox3, Major CN grossly intact.  Speech clear. No gross focal motor or sensory deficits in extremities.; Skin: Color normal, Warm,  Dry.; Psych:  Anxious, rapid speech.   ED Course  Procedures     MDM  MDM Reviewed: previous chart, nursing note and vitals Reviewed previous: labs, ECG and x-ray (V/Q scan) Interpretation: labs, ECG and x-ray (V/Q scan)    Date: 04/23/2012  Rate: 64  Rhythm: normal sinus rhythm and premature atrial contractions (PAC)  QRS Axis: normal  Intervals: normal  ST/T Wave abnormalities: normal  Conduction Disutrbances:none  Narrative Interpretation:   Old EKG Reviewed: unchanged; no significant changes from previous EKG dated 10/23/20113.   Results for orders placed during the hospital encounter of 04/23/12  GLUCOSE, CAPILLARY      Result Value Range   Glucose-Capillary 86  70 - 99 mg/dL  TROPONIN I      Result Value Range   Troponin I <0.30  <0.30 ng/mL  D-DIMER, QUANTITATIVE      Result Value Range   D-Dimer, Quant 2.12 (*) 0.00 - 0.48 ug/mL-FEU  BASIC METABOLIC PANEL      Result Value Range   Sodium 135  135 - 145 mEq/L   Potassium 4.3  3.5 - 5.1 mEq/L   Chloride 100  96 - 112 mEq/L   CO2 22  19 - 32 mEq/L   Glucose, Bld 89  70 - 99 mg/dL   BUN 45 (*) 6 - 23 mg/dL   Creatinine, Ser 3.32 (*) 0.50 - 1.35 mg/dL   Calcium 9.5  8.4 - 10.5 mg/dL   GFR calc non Af Amer 21 (*) >90 mL/min   GFR calc Af Amer 24 (*) >90 mL/min  CBC WITH DIFFERENTIAL      Result Value Range   WBC 8.9  4.0 - 10.5 K/uL   RBC 4.74  4.22 - 5.81 MIL/uL   Hemoglobin 12.7 (*) 13.0 - 17.0 g/dL   HCT 39.4  39.0 - 52.0 %   MCV 83.1  78.0 - 100.0 fL   MCH 26.8  26.0 - 34.0 pg   MCHC 32.2  30.0 - 36.0 g/dL   RDW 17.6 (*) 11.5 - 15.5 %   Platelets 223  150 - 400 K/uL   Neutrophils Relative 66  43 - 77 %   Neutro  Abs 5.8  1.7 - 7.7 K/uL   Lymphocytes Relative 25  12 - 46 %   Lymphs Abs 2.2  0.7 - 4.0 K/uL   Monocytes Relative 7  3 - 12 %   Monocytes Absolute 0.6  0.1 - 1.0 K/uL   Eosinophils Relative 2  0 - 5 %   Eosinophils Absolute 0.2  0.0 - 0.7 K/uL   Basophils Relative 0  0 - 1 %   Basophils Absolute 0.0  0.0 - 0.1 K/uL  URINALYSIS, ROUTINE W REFLEX MICROSCOPIC      Result Value Range   Color, Urine YELLOW  YELLOW   APPearance CLEAR  CLEAR   Specific Gravity, Urine 1.025  1.005 - 1.030   pH 5.5  5.0 - 8.0   Glucose, UA NEGATIVE  NEGATIVE mg/dL   Hgb urine dipstick TRACE (*) NEGATIVE   Bilirubin Urine NEGATIVE  NEGATIVE   Ketones, ur NEGATIVE  NEGATIVE mg/dL   Protein, ur 100 (*) NEGATIVE mg/dL   Urobilinogen, UA 0.2  0.0 - 1.0 mg/dL   Nitrite NEGATIVE  NEGATIVE   Leukocytes, UA NEGATIVE  NEGATIVE  URINE MICROSCOPIC-ADD ON      Result Value Range   WBC, UA 0-2  <3 WBC/hpf   Casts HYALINE CASTS (*) NEGATIVE  Dg Chest 2 View 04/23/2012  *RADIOLOGY REPORT*  Clinical Data: Right-sided chest pain.  CHEST - 2 VIEW  Comparison: Chest x-ray 04/02/2012.  Findings: Lung volumes are normal.  No consolidative airspace disease.  No pleural effusions.  No pneumothorax.  No pulmonary nodule or mass noted.  Pulmonary vasculature and the cardiomediastinal silhouette are within normal limits. Atherosclerosis in the thoracic aorta.  IMPRESSION: 1. No radiographic evidence of acute cardiopulmonary disease. 2.  Atherosclerosis.   Original Report Authenticated By: Vinnie Langton, M.D.    Nm Pulmonary Perf And Vent 04/23/2012  *RADIOLOGY REPORT*  Clinical Data: 47 year old male with chest pain, shortness of breath and elevated D-dimer.  NUCLEAR MEDICINE VENTILATION - PERFUSION LUNG SCAN  Technique:  Ventilation images were obtained in multiple projections using inhaled aerosol technetium 99 M DTPA.  Perfusion images were obtained in multiple projections after intravenous injection of Tc-9m MAA.   Radiopharmaceuticals:  11mCi Tc-22m DTPA aerosol and 6.0 mCi Tc-36m MAA.  Comparison: 10/12/2010 VQ study and 04/23/2012 chest radiograph  Findings:  Ventilation:   No focal ventilation defect.  Perfusion:   No wedge shaped peripheral perfusion defects to suggest acute pulmonary embolism.  No abnormalities are identified.  IMPRESSION: Normal pulmonary ventilation perfusion study.   Original Report Authenticated By: Margarette Canada, M.D.     Results for ESAIAS, KROMER (MRN [id number]) as of 04/23/2012 21:14  Ref. Range 02/05/2012 11:25 02/24/2012 09:58 03/01/2012 11:37 04/02/2012 18:33 04/23/2012 16:01  BUN Latest Range: 6-23 mg/dL 50 (H) 55 (H) 55 (H) 51 (H) 45 (H)  Creatinine Latest Range: 0.50-1.35 mg/dL 4.49 (H) 4.20 (H) 4.10 (H) 3.70 (H) 3.32 (H)    2100:  Will tx with abx for dysuria/urethritis.  UC is pending.  No acute findings on workup otherwise.  BUN/Cr per baseline.  Doubt PE as cause for symptoms given normal V/Q scan.  Doubt ACS as cause for symptoms given normal troponin and unchanged EKG with constant symptoms for 2-3 days. Pt states he wants to go home now.  Dx and testing d/w pt.  Questions answered.  Verb understanding, agreeable to d/c home with outpt f/u.            Alfonzo Feller, DO 04/26/12 1216

## 2012-04-23 NOTE — ED Notes (Signed)
Pt in CT.

## 2012-04-25 LAB — URINE CULTURE

## 2012-04-26 ENCOUNTER — Other Ambulatory Visit (HOSPITAL_COMMUNITY): Payer: Self-pay | Admitting: "Endocrinology

## 2012-04-26 ENCOUNTER — Ambulatory Visit (HOSPITAL_COMMUNITY)
Admission: RE | Admit: 2012-04-26 | Discharge: 2012-04-26 | Disposition: A | Discharge status: Home / Self Care | Payer: Medicare Other | Source: Ambulatory Visit | Attending: Internal Medicine | Admitting: Internal Medicine

## 2012-04-26 DIAGNOSIS — R279 Unspecified lack of coordination: Secondary | ICD-10-CM

## 2012-04-26 DIAGNOSIS — M6281 Muscle weakness (generalized): Secondary | ICD-10-CM

## 2012-04-26 DIAGNOSIS — M109 Gout, unspecified: Secondary | ICD-10-CM

## 2012-04-26 NOTE — Progress Notes (Signed)
Physical Therapy Treatment Patient Details  Name: Jack Irwin MRN: [id number] Date of Birth: 1965/04/20  Today's Date: 04/26/2012 Time: 1350-1440 PT Time Calculation (min): 50 min Charges: 25' TE, 14' Self Care Visit#: 3 of 8  Re-eval: 05/15/12    Authorization: MEDICARE  Authorization Time Period:    Authorization Visit#: 3 of 10   Subjective: Symptoms/Limitations Pertinent History: Pt reports that he had to go to the ER this weekend due to pain in his back.  Educated pt on back pain and likelyhood of having aches and pains after workout session.  Pain Assessment Currently in Pain?: Yes Pain Score:   8 Pain Location: Generalized Pain Type: Chronic pain  Precautions/Restrictions  Precautions Precautions: None  Exercise/Treatments Mobility/Balance   Aerobic Tread Mill: Gait training  @  1.0-->1.1 20'  total; standing rest breaks x5 minutes total.  (discussion on diaphragmatic breathing during rest breaks. ) Sit to stand: 6x with UE Assist  Physical Therapy Assessment and Plan PT Assessment and Plan Clinical Impression Statement: Pt able to stand and walk for at most 7 minutes with max encouragment and cueing for posture.  Educated pt on importance of continuing with exercise in order to reach his goals.  Importance of diaphragmatic breathing and core strengthening for posture. Pt rated session of 5/10 on Borg scale.     Goals    Problem List Patient Active Problem List  Diagnosis  . Difficulty in walking  . Weakness of both legs  . Poor balance  . Lack of coordination  . Muscle weakness (generalized)  . Arthritis, gouty    General Behavior During Session: [facility] for tasks performed Cognition: [facility] for tasks performed PT Plan of Care PT Patient Instructions: educated pt on improtance to continue with HEP and to continue with walking.  Consulted and Agree with Plan of Care: Patient  [name], PT 04/26/2012, 2:47 PM

## 2012-04-26 NOTE — Progress Notes (Signed)
Occupational Therapy Treatment Patient Details  Name: Jack Irwin MRN: [id number] Date of Birth: 03-04-1965  Today's Date: 04/26/2012 Time: [id number] OT Time Calculation (min): 38 min Therex [id number] 23'  Visit#: 3 of 12  Re-eval: 05/13/12    Authorization: Medicare  Authorization Time Period: before 10th visit  Authorization Visit#: 3 of 10  Subjective Symptoms/Limitations Symptoms: S: I was in the ER Friday because I was in pain. My back, chest, and everything hurt. Pain Assessment Currently in Pain?: Yes Pain Score:   8 Pain Location: Generalized Pain Type: Chronic pain  Precautions/Restrictions  Precautions Precautions: None  Exercise/Treatments Hand Exercises Theraputty - Flatten: pink Theraputty - Roll: pink Theraputty - Grip: pink Theraputty - Pinch: pink        Occupational Therapy Assessment and Plan OT Assessment and Plan Clinical Impression Statement: A: Added time and distance with NuStep. Attempted handgripper with large beads. Unable to perform this date. OT Plan: P: Cont. to work on increasing grip strength. Add shoulder and upper back strengthening exercises.   Goals Short Term Goals Time to Complete Short Term Goals: 3 weeks Short Term Goal 1: Patient will be educated on a HEP. Short Term Goal 1 Progress: Progressing toward goal Short Term Goal 2: Patient will increase upper back strength/scapular stability from fair to good for increased upright posture when walking. Short Term Goal 2 Progress: Progressing toward goal Short Term Goal 3: Patient will be able to complete BUE and BLE bathing activities in standing position. Short Term Goal 3 Progress: Progressing toward goal Short Term Goal 4: Patient will increase his sustained activity tolerance from fair to fair + for greater ability to complete laundry tasks. Short Term Goal 4 Progress: Progressing toward goal Short Term Goal 5: Patient will increase bilateral grip strength by 5 pounds  and pinch strength by 2 pounds for increased ability to open containers. Short Term Goal 5 Progress: Progressing toward goal Additional Short Term Goals?: Yes Short Term Goal 6: Patient will increase fine motor coordination for increased independence opening toothpaste by decreasing completion time on Nine Hole Peg Test with his left hand by 20'. Short Term Goal 6 Progress: Progressing toward goal Long Term Goals Time to Complete Long Term Goals: 6 weeks Long Term Goal 1: Patient will improve his functional independence level to highest possible, demonstrated by increasing his score on the Functional Assessment Questionairre to 80% or better.  Long Term Goal 1 Progress: Progressing toward goal Long Term Goal 2: Patient will be able to fasten buttons, tie shoes, manipulate zippers on his clothing independently. Long Term Goal 2 Progress: Progressing toward goal Long Term Goal 3: Patient will increase his upper back/scapular strength to good + for increased upright posture when walking.  Long Term Goal 3 Progress: Progressing toward goal Long Term Goal 4: Patient will increase his grip strength by 15 pounds and pinch strength by 5 pounds for increased ability to open containers. Long Term Goal 4 Progress: Progressing toward goal Long Term Goal 5: Patient will increase fine motor coordination needed to fasten buttons and tie shoes by decreasing completion time on Nine Hole Peg Test to 40" or less.   Long Term Goal 5 Progress: Progressing toward goal Additional Long Term Goals?: Yes Long Term Goal 6: Patient will have good - activity tolerance for greater participation in IADLs.  Long Term Goal 6 Progress: Progressing toward goal  Problem List Patient Active Problem List  Diagnosis  . Difficulty in walking  . Weakness of both legs  .  Poor balance  . Lack of coordination  . Muscle weakness (generalized)  . Arthritis, gouty    End of Session Activity Tolerance: Patient tolerated treatment  well General Behavior During Session: [facility] for tasks performed Cognition: [facility] for tasks performed   Ailene Ravel, OTR/L  04/26/2012, 2:28 PM

## 2012-04-29 ENCOUNTER — Ambulatory Visit (HOSPITAL_COMMUNITY)
Admission: RE | Admit: 2012-04-29 | Discharge: 2012-04-29 | Disposition: A | Discharge status: Home / Self Care | Payer: Medicare Other | Source: Ambulatory Visit | Attending: Internal Medicine | Admitting: Internal Medicine

## 2012-04-29 DIAGNOSIS — M109 Gout, unspecified: Secondary | ICD-10-CM

## 2012-04-29 DIAGNOSIS — R279 Unspecified lack of coordination: Secondary | ICD-10-CM

## 2012-04-29 DIAGNOSIS — M6281 Muscle weakness (generalized): Secondary | ICD-10-CM

## 2012-04-29 NOTE — Progress Notes (Signed)
Occupational Therapy Treatment Patient Details  Name: Jack Irwin MRN: [id number] Date of Birth: 1965/08/23  Today's Date: 04/29/2012 Time: [id number] OT Time Calculation (min): 45 min Theract 1430-1500 30' Therex [id number] 15'  Visit#: 4 of 12  Re-eval: 05/13/12    Authorization: Medicare  Authorization Time Period: before 10th visit  Authorization Visit#: 4 of 10  Subjective Symptoms/Limitations Symptoms: S: I'm sore today. Pain Assessment Currently in Pain?: Yes Pain Score:   1 Pain Location: Back Pain Orientation: Lower Pain Type: Chronic pain  Precautions/Restrictions  Precautions Precautions: None  Exercise/Treatments Seated Extension: Theraband;12 reps Theraband Level (Shoulder Extension): Level 2 (Red) Retraction: Theraband;12 reps Theraband Level (Shoulder Retraction): Level 2 (Red) Horizontal ABduction: Theraband;10 reps Theraband Level (Shoulder Horizontal ABduction): Level 2 (Red)   Hand Exercises Theraputty - Flatten: pink Theraputty - Roll: pink Theraputty - Grip: pink Theraputty - Pinch: pink        Occupational Therapy Assessment and Plan OT Assessment and Plan Clinical Impression Statement: A: Added additional theraband. Needed vc's for technique. OT Plan: P: Cont. to work on increasing grip strength. Add shoulder and upper back strengthening exercises.   Goals Short Term Goals Time to Complete Short Term Goals: 3 weeks Short Term Goal 1: Patient will be educated on a HEP. Short Term Goal 1 Progress: Progressing toward goal Short Term Goal 2: Patient will increase upper back strength/scapular stability from fair to good for increased upright posture when walking. Short Term Goal 2 Progress: Progressing toward goal Short Term Goal 3: Patient will be able to complete BUE and BLE bathing activities in standing position. Short Term Goal 3 Progress: Progressing toward goal Short Term Goal 4: Patient will increase his sustained activity  tolerance from fair to fair + for greater ability to complete laundry tasks. Short Term Goal 4 Progress: Progressing toward goal Short Term Goal 5: Patient will increase bilateral grip strength by 5 pounds and pinch strength by 2 pounds for increased ability to open containers. Short Term Goal 5 Progress: Progressing toward goal Additional Short Term Goals?: Yes Short Term Goal 6: Patient will increase fine motor coordination for increased independence opening toothpaste by decreasing completion time on Nine Hole Peg Test with his left hand by 20'. Short Term Goal 6 Progress: Progressing toward goal Long Term Goals Time to Complete Long Term Goals: 6 weeks Long Term Goal 1: Patient will improve his functional independence level to highest possible, demonstrated by increasing his score on the Functional Assessment Questionairre to 80% or better.  Long Term Goal 1 Progress: Progressing toward goal Long Term Goal 2: Patient will be able to fasten buttons, tie shoes, manipulate zippers on his clothing independently. Long Term Goal 2 Progress: Progressing toward goal Long Term Goal 3: Patient will increase his upper back/scapular strength to good + for increased upright posture when walking.  Long Term Goal 3 Progress: Progressing toward goal Long Term Goal 4: Patient will increase his grip strength by 15 pounds and pinch strength by 5 pounds for increased ability to open containers. Long Term Goal 4 Progress: Progressing toward goal Long Term Goal 5: Patient will increase fine motor coordination needed to fasten buttons and tie shoes by decreasing completion time on Nine Hole Peg Test to 40" or less.   Long Term Goal 5 Progress: Progressing toward goal Additional Long Term Goals?: Yes Long Term Goal 6: Patient will have good - activity tolerance for greater participation in IADLs.  Long Term Goal 6 Progress: Progressing toward goal  Problem List  Patient Active Problem List  Diagnosis  .  Difficulty in walking  . Weakness of both legs  . Poor balance  . Lack of coordination  . Muscle weakness (generalized)  . Arthritis, gouty    End of Session Activity Tolerance: Patient tolerated treatment well General Behavior During Session: [facility] for tasks performed Cognition: [facility] for tasks performed   Ailene Ravel, OTR/L  04/29/2012, 3:18 PM

## 2012-04-29 NOTE — Progress Notes (Signed)
Physical Therapy Treatment Patient Details  Name: Jack Irwin MRN: [id number] Date of Birth: 1965/08/20  Today's Date: 04/29/2012 Time: [id number] PT Time Calculation (min): 43 min Charge: Gait x 22, self care x 10', therex 10'  Visit#: 4 of 8  Re-eval: 05/15/12    Authorization: MEDICARE  Authorization Time Period:    Authorization Visit#: 4 of 10   Subjective: Symptoms/Limitations Symptoms: Pt reported LBP 3/10.  Pt c/o not motivation to complete walking/HEP outside of therapy Pain Assessment Currently in Pain?: Yes Pain Score:   1 Pain Location: Back Pain Orientation: Lower Pain Type: Chronic pain  Precautions/Restrictions  Precautions Precautions: None  Exercise/Treatments Aerobic Tread Mill: Gait training  @  1.0-->1.2 22'  4 seqments; 22' total, 5', 5', 7', 5' (discussion of diaphragmatic breathing during rest breaks) Standing Other Standing Knee Exercises: 6 STS with 1 HHA Supine Other Supine Knee Exercises: Self care education on importance of completeing HEP, Cueing for hand and foot placement for safety and strengthening x 10'    Physical Therapy Assessment and Plan PT Assessment and Plan Clinical Impression Statement: Increased time and cadence with gait training to improve activity tolerance with max encouragement and cueing for posture, pt limited by fatigue following.  Educated pt on importance of continuing exercises outside of therapy for maximum benengits of strengthening, activitiy tolerance to reach his personal physical goals.   PT Plan: Pt does not wish to focus on pain.  Continue with ambulating in open space or on TM to improve motivation. Nustep for activity tolerance.  Continue to encourage HEP.  Focus on improving core strength, standing posture, heel strike, rt knee fleixon during gait    Goals    Problem List Patient Active Problem List  Diagnosis  . Difficulty in walking  . Weakness of both legs  . Poor balance  . Lack of  coordination  . Muscle weakness (generalized)  . Arthritis, gouty    PT - End of Session Activity Tolerance: Patient tolerated treatment well;Patient limited by fatigue General Behavior During Session: [facility] for tasks performed Cognition: [facility] for tasks performed  GP    Aldona Lento 04/29/2012, 5:13 PM

## 2012-05-03 ENCOUNTER — Emergency Department (HOSPITAL_COMMUNITY): Payer: Medicare Other

## 2012-05-03 ENCOUNTER — Emergency Department (HOSPITAL_COMMUNITY)
Admission: EM | Admit: 2012-05-03 | Discharge: 2012-05-03 | Disposition: A | Discharge status: Home / Self Care | Payer: Medicare Other | Attending: Emergency Medicine | Admitting: Emergency Medicine

## 2012-05-03 ENCOUNTER — Encounter (HOSPITAL_COMMUNITY): Payer: Self-pay | Admitting: *Deleted

## 2012-05-03 DIAGNOSIS — G8929 Other chronic pain: Secondary | ICD-10-CM | POA: Insufficient documentation

## 2012-05-03 DIAGNOSIS — Z862 Personal history of diseases of the blood and blood-forming organs and certain disorders involving the immune mechanism: Secondary | ICD-10-CM | POA: Insufficient documentation

## 2012-05-03 DIAGNOSIS — R0600 Dyspnea, unspecified: Secondary | ICD-10-CM

## 2012-05-03 DIAGNOSIS — R42 Dizziness and giddiness: Secondary | ICD-10-CM | POA: Insufficient documentation

## 2012-05-03 DIAGNOSIS — N184 Chronic kidney disease, stage 4 (severe): Secondary | ICD-10-CM | POA: Insufficient documentation

## 2012-05-03 DIAGNOSIS — N400 Enlarged prostate without lower urinary tract symptoms: Secondary | ICD-10-CM | POA: Insufficient documentation

## 2012-05-03 DIAGNOSIS — M109 Gout, unspecified: Secondary | ICD-10-CM | POA: Insufficient documentation

## 2012-05-03 DIAGNOSIS — Z87448 Personal history of other diseases of urinary system: Secondary | ICD-10-CM | POA: Insufficient documentation

## 2012-05-03 DIAGNOSIS — Z8719 Personal history of other diseases of the digestive system: Secondary | ICD-10-CM | POA: Insufficient documentation

## 2012-05-03 DIAGNOSIS — I129 Hypertensive chronic kidney disease with stage 1 through stage 4 chronic kidney disease, or unspecified chronic kidney disease: Secondary | ICD-10-CM | POA: Insufficient documentation

## 2012-05-03 DIAGNOSIS — Z8679 Personal history of other diseases of the circulatory system: Secondary | ICD-10-CM | POA: Insufficient documentation

## 2012-05-03 DIAGNOSIS — E1149 Type 2 diabetes mellitus with other diabetic neurological complication: Secondary | ICD-10-CM | POA: Insufficient documentation

## 2012-05-03 DIAGNOSIS — R0609 Other forms of dyspnea: Secondary | ICD-10-CM | POA: Insufficient documentation

## 2012-05-03 DIAGNOSIS — Z86711 Personal history of pulmonary embolism: Secondary | ICD-10-CM | POA: Insufficient documentation

## 2012-05-03 DIAGNOSIS — Z86718 Personal history of other venous thrombosis and embolism: Secondary | ICD-10-CM | POA: Insufficient documentation

## 2012-05-03 DIAGNOSIS — Z79899 Other long term (current) drug therapy: Secondary | ICD-10-CM | POA: Insufficient documentation

## 2012-05-03 DIAGNOSIS — R079 Chest pain, unspecified: Secondary | ICD-10-CM

## 2012-05-03 DIAGNOSIS — Z8669 Personal history of other diseases of the nervous system and sense organs: Secondary | ICD-10-CM | POA: Insufficient documentation

## 2012-05-03 DIAGNOSIS — R0989 Other specified symptoms and signs involving the circulatory and respiratory systems: Secondary | ICD-10-CM | POA: Insufficient documentation

## 2012-05-03 DIAGNOSIS — M549 Dorsalgia, unspecified: Secondary | ICD-10-CM | POA: Insufficient documentation

## 2012-05-03 DIAGNOSIS — E1142 Type 2 diabetes mellitus with diabetic polyneuropathy: Secondary | ICD-10-CM | POA: Insufficient documentation

## 2012-05-03 LAB — BLOOD GAS, ARTERIAL
FIO2: 21 %
pCO2 arterial: 37.4 mmHg (ref 35.0–45.0)
pO2, Arterial: 86.8 mmHg (ref 80.0–100.0)

## 2012-05-03 LAB — CBC WITH DIFFERENTIAL/PLATELET
Basophils Relative: 0 % (ref 0–1)
Eosinophils Absolute: 0.2 10*3/uL (ref 0.0–0.7)
Lymphs Abs: 1.9 10*3/uL (ref 0.7–4.0)
MCH: 27.4 pg (ref 26.0–34.0)
MCHC: 33.1 g/dL (ref 30.0–36.0)
Neutrophils Relative %: 71 % (ref 43–77)
Platelets: 229 10*3/uL (ref 150–400)
RBC: 4.27 MIL/uL (ref 4.22–5.81)

## 2012-05-03 LAB — COMPREHENSIVE METABOLIC PANEL
ALT: 22 U/L (ref 0–53)
AST: 53 U/L — ABNORMAL HIGH (ref 0–37)
Albumin: 3.5 g/dL (ref 3.5–5.2)
Alkaline Phosphatase: 74 U/L (ref 39–117)
Potassium: 5.4 mEq/L — ABNORMAL HIGH (ref 3.5–5.1)
Sodium: 137 mEq/L (ref 135–145)
Total Protein: 7.4 g/dL (ref 6.0–8.3)

## 2012-05-03 LAB — APTT: aPTT: 33 seconds (ref 24–37)

## 2012-05-03 MED ORDER — HYDROCODONE-ACETAMINOPHEN 5-325 MG PO TABS: 2.0000 | ORAL_TABLET | ORAL | Status: DC | PRN

## 2012-05-03 MED ORDER — TECHNETIUM TO 99M ALBUMIN AGGREGATED
6.0000 | Freq: Once | INTRAVENOUS | Status: AC | PRN
  Administered 2012-05-03: 5.5 via INTRAVENOUS

## 2012-05-03 MED ORDER — TECHNETIUM TC 99M DIETHYLENETRIAME-PENTAACETIC ACID
40.0000 | Freq: Once | INTRAVENOUS | Status: AC | PRN
  Administered 2012-05-03: 40 via INTRAVENOUS

## 2012-05-03 NOTE — ED Provider Notes (Signed)
History  This chart was scribed for Veryl Speak, MD by Jenne Campus, ED Scribe. This patient was seen in room APA11/APA11 and the patient's care was started at 1:22 PM.  CSN: [id number]  Arrival date & time 05/03/12  1211   First MD Initiated Contact with Patient 05/03/12 1322      Chief Complaint  Patient presents with  . Chest Pain     The history is provided by the patient. No language interpreter was used.    Jack Irwin is a 47 y.o. male who presents to the Emergency Department complaining of 2 days of gradual onset, gradually worsening, constant left-sided CP that radiates to the back with associated dizziness, right-headed HA and "knots" on the back of bilateral legs. The pain is worse with deep breathing. He reports improvement with "pain medication". He denies any recent falls, injuries or long distance trips.  Pt reports that he has been using a walker over the past few days due to the dizziness. He reports being on coumadin "for a while" for DVTs. He denies being on coumadin currently.  He c/o chronic right-sided back pain but denies fever, cough as associated symptoms. He denies having a h/o cardiac problems. He has a h/o stage 4 renal failure diagnosed through protein in the urine, HTN and DM. He denies smoking and alcohol use.  Has an appointment tomorrow with endocrinologist.  Past Medical History  Diagnosis Date  . Hypertension   . Diabetes mellitus   . Gout   . Chronic back pain   . BPH (benign prostatic hyperplasia)   . Anemia   . DVT (deep venous thrombosis)   . Neuropathy   . Decubitus ulcer     of 2nd toes of both feet.  . Edema leg   . Constipation   . Venous (peripheral) insufficiency   . Neuropathy, diabetic   . Physical deconditioning   . Poor balance   . Difficulty walking   . Lack of coordination   . Renal insufficiency   . Chronic kidney disease   . Chronic kidney disease (CKD), stage IV (severe)     Past Surgical History   Procedure Laterality Date  . None      Family History  Problem Relation Age of Onset  . Diabetes Mother   . Hypertension Mother   . Heart failure Mother   . Hyperlipidemia Mother   . Cancer Father   . Diabetes Father   . Hypertension Father   . Hyperlipidemia Father     History  Substance Use Topics  . Smoking status: Never Smoker   . Smokeless tobacco: Never Used  . Alcohol Use: No      Review of Systems  Constitutional: Negative for fever and chills.  Cardiovascular: Positive for chest pain. Negative for leg swelling.  Musculoskeletal: Positive for back pain (chronic).  Neurological: Positive for dizziness and headaches. Negative for numbness.  All other systems reviewed and are negative.    Allergies  Review of patient's allergies indicates no known allergies.  Home Medications   Current Outpatient Rx  Name  Route  Sig  Dispense  Refill  . calcium acetate (PHOSLO) 667 MG capsule   Oral   Take 1,334 mg by mouth 3 (three) times daily with meals. BINDER         . colchicine 0.6 MG tablet   Oral   Take 0.6 mg by mouth 2 (two) times daily as needed. For GOUT PAIN         .  Febuxostat (ULORIC) 80 MG TABS   Oral   Take 1 tablet by mouth daily as needed. For GOUT/HIGH URIC ACID         . methocarbamol (ROBAXIN) 500 MG tablet   Oral   Take 2 tablets (1,000 mg total) by mouth 4 (four) times daily as needed (muscle spasm/pain).   25 tablet   0   . metoprolol (LOPRESSOR) 50 MG tablet   Oral   Take 50 mg by mouth daily. For HEART         . oxyCODONE-acetaminophen (PERCOCET/ROXICET) 5-325 MG per tablet   Oral   Take 1-2 tablets by mouth every 4 (four) hours as needed for pain.   10 tablet   0   . vardenafil (LEVITRA) 20 MG tablet   Oral   Take 20 mg by mouth daily as needed. For sexual intercourse           Triage Vitals: BP 105/83  Pulse 67  Temp(Src) 98 F (36.7 C) (Oral)  Resp 22  Ht 5\' 11"  (1.803 m)  Wt 298 lb (135.172 kg)  BMI  41.58 kg/m2  SpO2 100%  Physical Exam  Nursing note and vitals reviewed. Constitutional: He is oriented to person, place, and time. He appears well-developed and well-nourished. No distress.  HENT:  Head: Normocephalic and atraumatic.  Eyes: Conjunctivae and EOM are normal.  Neck: Neck supple. No tracheal deviation present.  Cardiovascular: Normal rate and regular rhythm.   Pulmonary/Chest: Effort normal and breath sounds normal. No respiratory distress.  Abdominal: Soft. There is no tenderness.  Musculoskeletal: Normal range of motion. He exhibits no edema.  Neurological: He is alert and oriented to person, place, and time.  Skin: Skin is warm and dry.  Psychiatric: He has a normal mood and affect. His behavior is normal.    ED Course  Procedures (including critical care time)  DIAGNOSTIC STUDIES: Oxygen Saturation is 100% on room air, normal by my interpretation.    COORDINATION OF CARE: 1:28 PM-Discussed treatment plan which includes CXR, CBC panel, d-dimer, and CMP with pt at bedside and pt agreed to plan.   Labs Reviewed - No data to display No results found.   No diagnosis found.   Date: 05/03/2012  Rate: 58  Rhythm: sinus bradycardia  QRS Axis: normal  Intervals: normal  ST/T Wave abnormalities: normal  Conduction Disutrbances:none  Narrative Interpretation:   Old EKG Reviewed: unchanged    MDM  The patient presents here with pleuritic chest pain, shortness of breath, and a history of pe.  The d-dimer was positive and followed up with a VQ scan.  This was low probability for pe.  I was not aware of this, but the patient had a VQ scan 10 days ago for similar complaints.  He did not tell me this when I informed him of the treatment plan.  He will be discharged, needs follow up with pcp to discuss.      I personally performed the services described in this documentation, which was scribed in my presence. The recorded information has been reviewed and is  accurate.      Veryl Speak, MD 05/03/12 [phone]

## 2012-05-03 NOTE — ED Notes (Signed)
Chest pain , dizziness, hurts to take a deep breath for 2 days, no cough  Pain in back

## 2012-05-03 NOTE — ED Notes (Signed)
Pt c/o chest pain with SOB x2-3 days. Pt states pain is worse with deep breathing. Pt also reports dizziness and "knots on legs". Pt states "knots on my legs hurts when I rub them".

## 2012-05-04 ENCOUNTER — Ambulatory Visit (HOSPITAL_COMMUNITY)
Admission: RE | Admit: 2012-05-04 | Discharge: 2012-05-04 | Disposition: A | Discharge status: Home / Self Care | Payer: Medicare Other | Source: Ambulatory Visit | Attending: Internal Medicine | Admitting: Internal Medicine

## 2012-05-04 DIAGNOSIS — IMO0001 Reserved for inherently not codable concepts without codable children: Secondary | ICD-10-CM | POA: Insufficient documentation

## 2012-05-04 DIAGNOSIS — M25569 Pain in unspecified knee: Secondary | ICD-10-CM | POA: Insufficient documentation

## 2012-05-04 DIAGNOSIS — R279 Unspecified lack of coordination: Secondary | ICD-10-CM | POA: Insufficient documentation

## 2012-05-04 DIAGNOSIS — M6281 Muscle weakness (generalized): Secondary | ICD-10-CM | POA: Insufficient documentation

## 2012-05-04 DIAGNOSIS — R262 Difficulty in walking, not elsewhere classified: Secondary | ICD-10-CM | POA: Insufficient documentation

## 2012-05-04 NOTE — Progress Notes (Signed)
Occupational Therapy Treatment Patient Details  Name: Jack Irwin MRN: [id number] Date of Birth: 1966-01-30  Today's Date: 05/04/2012 Time: [id number] OT Time Calculation (min): 36 min Therex 36'  Visit#: 5 of 12  Re-eval: 05/13/12    Authorization: Medicare  Authorization Time Period: before 10th visit  Authorization Visit#: 5 of 10  Subjective Symptoms/Limitations Symptoms: S:  I have a job interview on thursday. Pain Assessment Currently in Pain?: Yes Pain Score:   2 Pain Location: Hand Pain Orientation: Right;Left Pain Type: Chronic pain  Precautions/Restrictions     Exercise/Treatments Seated Extension: Theraband;12 reps Theraband Level (Shoulder Extension): Level 3 (Green) Retraction: Theraband;12 reps Theraband Level (Shoulder Retraction): Level 3 (Green) Row: Theraband;15 reps Theraband Level (Shoulder Row): Level 3 (Green)  Hand Exercises Theraputty - Flatten: pink Theraputty - Roll: pink Theraputty - Grip: pink Theraputty - Pinch: pink Theraputty - Locate Pegs: x12        Occupational Therapy Assessment and Plan OT Assessment and Plan Clinical Impression Statement: A:  V/C to heep patient on task and use good form. OT Plan: P:  Continue to increase upper back strength for good posture to allow for increase ease with uE function.   Goals Short Term Goals Time to Complete Short Term Goals: 3 weeks Short Term Goal 1: Patient will be educated on a HEP. Short Term Goal 2: Patient will increase upper back strength/scapular stability from fair to good for increased upright posture when walking. Short Term Goal 3: Patient will be able to complete BUE and BLE bathing activities in standing position. Short Term Goal 4: Patient will increase his sustained activity tolerance from fair to fair + for greater ability to complete laundry tasks. Short Term Goal 5: Patient will increase bilateral grip strength by 5 pounds and pinch strength by 2 pounds for  increased ability to open containers. Additional Short Term Goals?: Yes Short Term Goal 6: Patient will increase fine motor coordination for increased independence opening toothpaste by decreasing completion time on Nine Hole Peg Test with his left hand by 20'. Long Term Goals Time to Complete Long Term Goals: 6 weeks Long Term Goal 1: Patient will improve his functional independence level to highest possible, demonstrated by increasing his score on the Functional Assessment Questionairre to 80% or better.  Long Term Goal 2: Patient will be able to fasten buttons, tie shoes, manipulate zippers on his clothing independently. Long Term Goal 3: Patient will increase his upper back/scapular strength to good + for increased upright posture when walking.  Long Term Goal 4: Patient will increase his grip strength by 15 pounds and pinch strength by 5 pounds for increased ability to open containers. Long Term Goal 5: Patient will increase fine motor coordination needed to fasten buttons and tie shoes by decreasing completion time on Nine Hole Peg Test to 40" or less.   Additional Long Term Goals?: Yes Long Term Goal 6: Patient will have good - activity tolerance for greater participation in IADLs.   Problem List Patient Active Problem List  Diagnosis  . Difficulty in walking  . Weakness of both legs  . Poor balance  . Lack of coordination  . Muscle weakness (generalized)  . Arthritis, gouty    End of Session Activity Tolerance: Patient tolerated treatment well General Behavior During Session: [facility] for tasks performed Cognition: [facility] for tasks performed  GO   Bobbyjoe Pabst L. Thera Flake, COTA/L 05/04/2012, 2:08 PM

## 2012-05-04 NOTE — Progress Notes (Signed)
Physical Therapy Treatment Patient Details  Name: Jack Irwin MRN: [id number] Date of Birth: 12-31-1965  Today's Date: 05/04/2012 Time: [id number] PT Time Calculation (min): 44 min Visit#: 5 of 8  Authorization: MEDICARE  Authorization Visit#: 5 of 10  Charges:  gait 8', ther ex 24'  Subjective: Symptoms/Limitations Symptoms: Pt. reports he wants to build his activity tolerance. Pain Assessment Currently in Pain?: No/denies Pain Score:   2 Pain Location: Hand Pain Orientation: Right;Left Pain Type: Chronic pain   Exercise/Treatments Aerobic Stationary Bike: NuStep 22', seat 10, Level 2, 4 rest breaks Standing Gait Training: no AD, 400 feet in 4 minutes    Physical Therapy Assessment and Plan PT Assessment and Plan Clinical Impression Statement: Pt. able to complete nustep 22' with 4 short rest breaks.  Ambulated 400' without AD or rest break in 4 minutes.  Pt. continues to increase activity tolerance and independence.   Continue:  Continue to progress per POC to meet goals.   Problem List Patient Active Problem List  Diagnosis  . Difficulty in walking  . Weakness of both legs  . Poor balance  . Lack of coordination  . Muscle weakness (generalized)  . Arthritis, gouty    General Behavior During Session: [facility] for tasks performed Cognition: [facility] for tasks performed   Teena Irani, PTA/CLT 05/04/2012, 3:08 PM

## 2012-05-06 ENCOUNTER — Ambulatory Visit (HOSPITAL_COMMUNITY): Payer: Medicare Other | Admitting: Specialist

## 2012-05-13 ENCOUNTER — Inpatient Hospital Stay (HOSPITAL_COMMUNITY): Admission: RE | Admit: 2012-05-13 | Payer: Medicare Other | Source: Ambulatory Visit

## 2012-05-20 ENCOUNTER — Inpatient Hospital Stay (HOSPITAL_COMMUNITY): Admission: RE | Admit: 2012-05-20 | Payer: Medicare Other | Source: Ambulatory Visit

## 2012-05-25 ENCOUNTER — Ambulatory Visit (HOSPITAL_COMMUNITY)
Admission: RE | Admit: 2012-05-25 | Discharge: 2012-05-25 | Disposition: A | Discharge status: Home / Self Care | Payer: Medicare Other | Source: Ambulatory Visit | Attending: Internal Medicine | Admitting: Internal Medicine

## 2012-05-31 NOTE — Evaluation (Addendum)
Physical Therapy Re-Evaluation  Patient Details  Name: Jack Irwin MRN: [id number] Date of Birth: 05/22/1965  Today's Date: 05/25/2012 Time: [id number] PT Time Calculation (min): 60 min Charges: 1 re-evaluation, 25' PPT, 25' Self Care            Visit#: 6 of 8  Re-eval: 05/15/12 Assessment Diagnosis: Generalized weakness Next MD Visit: Dr,. Avbuere - March 31st  Authorization: MEDICARE    Authorization Time Period:    Authorization Visit#: 6 of 10   Subjective Symptoms/Limitations Symptoms: Pt reports that he has had finacial trouble and difficulty paying for gas to get to therapy.  He reports he is very motiviated and is working hard when he is at home, but feels he needs continued therapy to keep pushing him to improve even more.  How long can you stand comfortably?: 5 minutes (was 2-3 minutes) How long can you walk comfortably?: 5 minutes (was 2-3 minutes) Special Tests: Functional Assessment Questionairre, patient scored 58/64 = 90% was 41/64 = 64% independent leve). Patient Stated Goals: Pt wants to walk daughter down beauty show. Pain Assessment Currently in Pain?: No/denies  Precautions/Restrictions  Precautions Precautions: None  RLE Strength RLE Overall Strength Comments: taken in seated position Right Hip Flexion: 4/5 Right Hip Extension: 3+/5 Right Hip ABduction: 4/5 Right Hip ADduction: 3+/5 Right Knee Flexion: 3+/5 Right Knee Extension: 4/5 LLE Strength LLE Overall Strength Comments: taken in seated position Left Hip Flexion: 4/5 Left Hip Extension: 3+/5 Left Hip ABduction: 4/5 Left Hip ADduction: 4/5 Left Knee Flexion: 5/5 Left Knee Extension: 5/5  Exercise/Treatments Mobility/Balance  Ambulation/Gait Assistance: 7: Independent Ambulation Distance (Feet): 334 Feet (in 2 minutes, 664 in 5 minutes) Gait Pattern: Trunk flexed;Right flexed knee in stance;Lateral hip instability;Decreased hip/knee flexion - right Posture/Postural Control: Postural  limitations Postural Limitations: trunk flexion during gait: 20 degrees (was 40 degres); AROM trunk flexion while standing: 20 degrees Dynamic Gait Index Level Surface: Mild Impairment Change in Gait Speed: Mild Impairment Gait with Horizontal Head Turns: Mild Impairment Gait with Vertical Head Turns: Mild Impairment Gait and Pivot Turn: Mild Impairment Step Over Obstacle: Mild Impairment Step Around Obstacles: Mild Impairment Steps: Moderate Impairment Total Score: 15 Timed Up and Go Test TUG: Normal TUG Normal TUG (seconds): 9.45 (was 15.77 seconds)   Physical Therapy Assessment and Plan PT Assessment and Plan Clinical Impression Statement: Jack Irwin has attended 6 OP PT visits over the pain 7 weeks, he was unable to show for the past 3 weeks due to finicial difficulties with coming into therapy. Discussed with pt in detail about new POC options. Discussed to reducing to 1x/week for continued results. Overall pt is now able to tolerate ambulating with greater gait velocity for a full 5 minutes, has improved posture awareness and is able to self correct posture. He continues to be limited by overall hip weakness and decreased flexibilty to LE. He is motivated and has worked on his exercises at home, however requires skilled PT for continued motivation and advancing HEP to improve posture, activity tolerance and strength in order to progress to advancec HEP. Rehab Potential: Fair PT Frequency: Min 1X/week PT Duration: 4 weeks PT Plan: Continue to focus on activitiy tolerance and posture.  Add numbers, standing therabands, gait training on TM.  Pt requires reinforcement, and is working well with his HEP.      Goals Home Exercise Program: Independently PT Short Term Goals: 2 weeks PT Short Term Goal 1: Pt will improve his activity tolerance and ambulate independently for 10  minutes without rest break with moderate cueing for appropriate posture.  (5 minutes ): Progressing toward goal PT  Short Term Goal 2: Pt will improve his BLE knee flexibility and demonstrate knee flexion to 3 degrees when standing for improved body mechanics when walking.: Progressing toward goal PT Short Term Goal 3: Pt will improve his TUG to 13 seconds for safety while walking in the community.  : Met PT Short Term Goal 4: Pt will improve his activity tolerance and ambulate x30 minutes with trunk flexion less than 10 degrees in order to ambulate safely in the community to improve QOL.  : Progressing toward goal PT Long Term Goals: 8 weeks PT Long Term Goal 1: Pt will improve his BLE strength to [facility] in order to ascend and descend stairs forward with 1 handrail with step to pattern in order to safely enter family and friends home.: Progressing toward goal (step to pattern 1 handrail) PT Long Term Goal 2: Pt will improve his gait speed and ambulate 700 feet in two minutes for age approrpriate speed. Progressing towards (654 feet in 5 minutes, 334 ft in 2 minutes) Long Term Goal 3: Pt will improve his DGI to 14/24 for improved safety with ambulating in the community.  (15/24): Met Long Term Goal 4: Pt will improve his FAQ to 50/64 for improved percieved functional ability.  (58/64): Met PT Long Term Goal 5: Pt will improve his core strength in order to tolerate standing for greater than 15 minutes in order to independently take a shower and perform self care activities in the bathroom.: Progressing toward goal  Problem List Patient Active Problem List  Diagnosis  . Difficulty in walking  . Weakness of both legs  . Poor balance  . Lack of coordination  . Muscle weakness (generalized)  . Arthritis, gouty    General Cognition: WFL for tasks performed PT Plan of Care PT Home Exercise Plan: updated with advanced HEP PT Patient Instructions: discussed FAQ, new POC, importance of consistency with therapy, HEP, answered remaining questions.  Consulted and Agree with Plan of Care: Patient  GP    [name],  MPT, ATC 05/31/2012, 3:08 PM  Physician Documentation Your signature is required to indicate approval of the treatment plan as stated above.  Please sign and either send electronically or make a copy of this report for your files and return this physician signed original.   Please mark one 1.__approve of plan  2. ___approve of plan with the following conditions.   ______________________________                                                          _____________________ Physician Signature                                                                                                             Date

## 2012-06-02 ENCOUNTER — Emergency Department (HOSPITAL_COMMUNITY): Payer: Medicare Other

## 2012-06-02 ENCOUNTER — Inpatient Hospital Stay (HOSPITAL_COMMUNITY): Admission: RE | Admit: 2012-06-02 | Payer: Medicare Other | Source: Ambulatory Visit | Admitting: Specialist

## 2012-06-02 ENCOUNTER — Emergency Department (HOSPITAL_COMMUNITY)
Admission: EM | Admit: 2012-06-02 | Discharge: 2012-06-02 | Disposition: A | Discharge status: Home / Self Care | Payer: Medicare Other | Attending: Emergency Medicine | Admitting: Emergency Medicine

## 2012-06-02 ENCOUNTER — Telehealth (HOSPITAL_COMMUNITY): Payer: Self-pay

## 2012-06-02 ENCOUNTER — Encounter (HOSPITAL_COMMUNITY): Payer: Self-pay

## 2012-06-02 DIAGNOSIS — N185 Chronic kidney disease, stage 5: Secondary | ICD-10-CM | POA: Insufficient documentation

## 2012-06-02 DIAGNOSIS — Z86718 Personal history of other venous thrombosis and embolism: Secondary | ICD-10-CM | POA: Insufficient documentation

## 2012-06-02 DIAGNOSIS — E1142 Type 2 diabetes mellitus with diabetic polyneuropathy: Secondary | ICD-10-CM | POA: Insufficient documentation

## 2012-06-02 DIAGNOSIS — Z862 Personal history of diseases of the blood and blood-forming organs and certain disorders involving the immune mechanism: Secondary | ICD-10-CM | POA: Insufficient documentation

## 2012-06-02 DIAGNOSIS — M109 Gout, unspecified: Secondary | ICD-10-CM | POA: Insufficient documentation

## 2012-06-02 DIAGNOSIS — I129 Hypertensive chronic kidney disease with stage 1 through stage 4 chronic kidney disease, or unspecified chronic kidney disease: Secondary | ICD-10-CM | POA: Insufficient documentation

## 2012-06-02 DIAGNOSIS — K5900 Constipation, unspecified: Secondary | ICD-10-CM | POA: Insufficient documentation

## 2012-06-02 DIAGNOSIS — Z8679 Personal history of other diseases of the circulatory system: Secondary | ICD-10-CM | POA: Insufficient documentation

## 2012-06-02 DIAGNOSIS — Z8739 Personal history of other diseases of the musculoskeletal system and connective tissue: Secondary | ICD-10-CM | POA: Insufficient documentation

## 2012-06-02 DIAGNOSIS — Z872 Personal history of diseases of the skin and subcutaneous tissue: Secondary | ICD-10-CM | POA: Insufficient documentation

## 2012-06-02 DIAGNOSIS — R1033 Periumbilical pain: Secondary | ICD-10-CM | POA: Insufficient documentation

## 2012-06-02 DIAGNOSIS — G8929 Other chronic pain: Secondary | ICD-10-CM | POA: Insufficient documentation

## 2012-06-02 DIAGNOSIS — Z79899 Other long term (current) drug therapy: Secondary | ICD-10-CM | POA: Insufficient documentation

## 2012-06-02 DIAGNOSIS — E1149 Type 2 diabetes mellitus with other diabetic neurological complication: Secondary | ICD-10-CM | POA: Insufficient documentation

## 2012-06-02 DIAGNOSIS — R109 Unspecified abdominal pain: Secondary | ICD-10-CM

## 2012-06-02 DIAGNOSIS — Z87448 Personal history of other diseases of urinary system: Secondary | ICD-10-CM | POA: Insufficient documentation

## 2012-06-02 DIAGNOSIS — E669 Obesity, unspecified: Secondary | ICD-10-CM | POA: Insufficient documentation

## 2012-06-02 DIAGNOSIS — R42 Dizziness and giddiness: Secondary | ICD-10-CM | POA: Insufficient documentation

## 2012-06-02 DIAGNOSIS — M549 Dorsalgia, unspecified: Secondary | ICD-10-CM | POA: Insufficient documentation

## 2012-06-02 LAB — CBC WITH DIFFERENTIAL/PLATELET
Basophils Relative: 0 % (ref 0–1)
HCT: 36.4 % — ABNORMAL LOW (ref 39.0–52.0)
Hemoglobin: 12.1 g/dL — ABNORMAL LOW (ref 13.0–17.0)
Lymphs Abs: 2.2 10*3/uL (ref 0.7–4.0)
MCHC: 33.2 g/dL (ref 30.0–36.0)
Monocytes Absolute: 0.6 10*3/uL (ref 0.1–1.0)
Monocytes Relative: 7 % (ref 3–12)
Neutro Abs: 5.4 10*3/uL (ref 1.7–7.7)
RBC: 4.45 MIL/uL (ref 4.22–5.81)

## 2012-06-02 LAB — COMPREHENSIVE METABOLIC PANEL
ALT: 18 U/L (ref 0–53)
AST: 40 U/L — ABNORMAL HIGH (ref 0–37)
Calcium: 9.7 mg/dL (ref 8.4–10.5)
Creatinine, Ser: 4.18 mg/dL — ABNORMAL HIGH (ref 0.50–1.35)
GFR calc Af Amer: 18 mL/min — ABNORMAL LOW (ref >90)
Glucose, Bld: 73 mg/dL (ref 70–99)
Sodium: 136 mEq/L (ref 135–145)
Total Protein: 8.1 g/dL (ref 6.0–8.3)

## 2012-06-02 LAB — URINALYSIS, ROUTINE W REFLEX MICROSCOPIC
Glucose, UA: NEGATIVE mg/dL
Hgb urine dipstick: NEGATIVE
Leukocytes, UA: NEGATIVE
Specific Gravity, Urine: 1.02 (ref 1.005–1.030)
pH: 5.5 (ref 5.0–8.0)

## 2012-06-02 LAB — GLUCOSE, CAPILLARY: Glucose-Capillary: 82 mg/dL (ref 70–99)

## 2012-06-02 LAB — URINE MICROSCOPIC-ADD ON

## 2012-06-02 MED ORDER — METOCLOPRAMIDE HCL 10 MG PO TABS: 10.0000 mg | ORAL_TABLET | Freq: Three times a day (TID) | ORAL | Status: DC | PRN

## 2012-06-02 NOTE — ED Notes (Signed)
PO fluid challenge

## 2012-06-02 NOTE — Discharge Instructions (Signed)
Abdominal Pain Many things can cause belly (abdominal) pain. Most times, the belly pain is not dangerous. The amount of belly pain does not tell how serious the problem may be. Many cases of belly pain can be watched and treated at home. HOME CARE   Do not take medicines that help you go poop (laxatives) unless told to by your doctor.  Only take medicine as told by your doctor.  Eat or drink as told by your doctor. Your doctor will tell you if you should be on a special diet. GET HELP RIGHT AWAY IF:   The pain does not go away.  You have a fever.  You keep throwing up (vomiting).  The pain changes and is only in the right or left part of the belly.  You have bloody or tarry looking poop. MAKE SURE YOU:   Understand these instructions.  Will watch your condition.  Will get help right away if you are not doing well or get worse. Document Released: 07/09/2007 Document Revised: 04/14/2011 Document Reviewed: 02/05/2009 [facility] Patient Information 2013 [place].   Try to get one primary care doctor and stick with them.   Try new medication for abdominal pain.

## 2012-06-02 NOTE — ED Provider Notes (Signed)
History  This chart was scribed for Nat Christen, MD by Jenne Campus, ED Scribe. This patient was seen in room APA19/APA19 and the patient's care was started at 4:36 PM.  CSN: [id number]  Arrival date & time 06/02/12  1603   First MD Initiated Contact with Patient 06/02/12 1636      Chief Complaint  Patient presents with  . Dizziness  . Abdominal Pain  . Constipation     The history is provided by the patient. No language interpreter was used.    HPI Comments:  BAELIN PETROSSIAN is a 47 y.o. male who presents to the Emergency Department complaining of a gradual onset, gradually worsening flare up of chronic periumbilical abdominal pain with associated constipation. Pt states that he has been trying to f/u with his PCP for the symptoms with no success due to financial difficulties. He also c/o bilateral hand pain with swelling to all the fingers that he attributes to gout. He states that he takes tylenol daily with little improvement. He denies nausea, emesis and diarrhea as associated symptoms. He has a h/o HTN, DM and chronic back pain. He denies smoking and alcohol use.   Past Medical History  Diagnosis Date  . Hypertension   . Diabetes mellitus   . Gout   . Chronic back pain   . BPH (benign prostatic hyperplasia)   . Anemia   . DVT (deep venous thrombosis)   . Neuropathy   . Decubitus ulcer     of 2nd toes of both feet.  . Edema leg   . Constipation   . Venous (peripheral) insufficiency   . Neuropathy, diabetic   . Physical deconditioning   . Poor balance   . Difficulty walking   . Lack of coordination   . Renal insufficiency   . Chronic kidney disease   . Chronic kidney disease (CKD), stage IV (severe)     Past Surgical History  Procedure Laterality Date  . None      Family History  Problem Relation Age of Onset  . Diabetes Mother   . Hypertension Mother   . Heart failure Mother   . Hyperlipidemia Mother   . Cancer Father   . Diabetes Father   .  Hypertension Father   . Hyperlipidemia Father     History  Substance Use Topics  . Smoking status: Never Smoker   . Smokeless tobacco: Never Used  . Alcohol Use: No      Review of Systems  A complete 10 system review of systems was obtained and all systems are negative except as noted in the HPI and PMH.   Allergies  Review of patient's allergies indicates no known allergies.  Home Medications   Current Outpatient Rx  Name  Route  Sig  Dispense  Refill  . calcium acetate (PHOSLO) 667 MG capsule   Oral   Take 1,334 mg by mouth 3 (three) times daily with meals. BINDER         . colchicine 0.6 MG tablet   Oral   Take 0.6 mg by mouth 2 (two) times daily as needed. For GOUT PAIN         . Febuxostat (ULORIC) 80 MG TABS   Oral   Take 1 tablet by mouth daily as needed. For GOUT/HIGH URIC ACID         . HYDROcodone-acetaminophen (NORCO) 5-325 MG per tablet   Oral   Take 2 tablets by mouth every 4 (four) hours as needed for  pain.   15 tablet   0   . methocarbamol (ROBAXIN) 500 MG tablet   Oral   Take 2 tablets (1,000 mg total) by mouth 4 (four) times daily as needed (muscle spasm/pain).   25 tablet   0   . metoprolol (LOPRESSOR) 50 MG tablet   Oral   Take 50 mg by mouth daily. For HEART         . oxyCODONE-acetaminophen (PERCOCET/ROXICET) 5-325 MG per tablet   Oral   Take 1-2 tablets by mouth every 4 (four) hours as needed for pain.   10 tablet   0   . vardenafil (LEVITRA) 20 MG tablet   Oral   Take 20 mg by mouth daily as needed. For sexual intercourse           Triage Vitals: BP 121/81  Pulse 68  Temp(Src) 97.2 F (36.2 C) (Oral)  Resp 20  Ht 5\' 11"  (1.803 m)  Wt 298 lb (135.172 kg)  BMI 41.58 kg/m2  SpO2 100%  Physical Exam  Nursing note and vitals reviewed. Constitutional: He is oriented to person, place, and time. No distress.  Obese   HENT:  Head: Normocephalic and atraumatic.  Eyes: Conjunctivae and EOM are normal. Pupils are  equal, round, and reactive to light.  Neck: Normal range of motion. Neck supple.  Cardiovascular: Normal rate, regular rhythm and normal heart sounds.   Pulmonary/Chest: Effort normal and breath sounds normal.  Abdominal: Soft. Bowel sounds are normal. There is tenderness (minimal generalized tenderness in the periumbilical region). There is no rebound and no guarding.  Musculoskeletal: Normal range of motion.  Neurological: He is alert and oriented to person, place, and time.  Skin: Skin is warm and dry.  Psychiatric: He has a normal mood and affect.    ED Course  Procedures (including critical care time)  Medications - No data to display  DIAGNOSTIC STUDIES: Oxygen Saturation is 100% on room air, normal by my interpretation.    COORDINATION OF CARE: 4:53 PM-Advised pt to increase his daily exercise, daily water intake and daily fruit intake to improve his constipation. Reviewed prior CT and xray results of the abdomen with pt. Discussed treatment plan which includes CBC panel, CMP, abdomen xray and UA with pt at bedside and pt agreed to plan.    Labs Reviewed  COMPREHENSIVE METABOLIC PANEL - Abnormal; Notable for the following:    BUN 75 (*)    Creatinine, Ser 4.18 (*)    AST 40 (*)    GFR calc non Af Amer 16 (*)    GFR calc Af Amer 18 (*)    All other components within normal limits  CBC WITH DIFFERENTIAL - Abnormal; Notable for the following:    Hemoglobin 12.1 (*)    HCT 36.4 (*)    RDW 16.6 (*)    All other components within normal limits  URINALYSIS, ROUTINE W REFLEX MICROSCOPIC - Abnormal; Notable for the following:    Protein, ur 30 (*)    All other components within normal limits  GLUCOSE, CAPILLARY  LIPASE, BLOOD  URINE MICROSCOPIC-ADD ON    Dg Abd Acute W/chest  06/02/2012  *RADIOLOGY REPORT*  Clinical Data: Epigastric abdominal pain.  Current history of hypertension, diabetes, and chronic kidney disease.  ACUTE ABDOMEN SERIES (ABDOMEN 2 VIEW & CHEST 1 VIEW)   Comparison: Acute abdomen series 02/24/2012 and several intervening chest x-rays.  Findings: Moderate gaseous distention of a solitary loop of jejunum in the left upper quadrant with  air-fluid levels on the erect image.  No bowel distention elsewhere.  No evidence of free intraperitoneal air.  Phleboliths in the pelvis.  No visible opaque urinary tract calculi.  Cardiomediastinal silhouette unremarkable, unchanged.  Lungs clear. Bronchovascular markings normal.  Pulmonary vascularity normal.  No pneumothorax.  No pleural effusions.  IMPRESSION:  1.  Early partial small bowel obstruction versus a localized ileus (as can be seen with enteritis or localized inflammatory disease such as pancreatitis), with dilation of a solitary loop of jejunum in the left upper quadrant.  No free intraperitoneal air. 2.  No acute cardiopulmonary disease.   Original Report Authenticated By: Evangeline Dakin, M.D.      No diagnosis found.    MDM  No acute abdomen.  Patient drinking lots of fluids with no vomiting.   No clinical evidence of ileus or small bowel obstruction.  Patient has chronic abdominal pain.   Will try Reglan 10 mg 3 times a day prn #30.  Encouraged patient to stay with one primary care Dr.   I personally performed the services described in this documentation, which was scribed in my presence. The recorded information has been reviewed and is accurate.      Nat Christen, MD 06/02/12 2104

## 2012-06-02 NOTE — ED Notes (Signed)
Patient given peanut butter crackers x2 and ginger ale x3 at patient request. No other needs voiced at this time.

## 2012-06-02 NOTE — ED Notes (Signed)
Pt c/o dizziness, constipation, abd pain, and generalized pain.

## 2012-06-09 ENCOUNTER — Ambulatory Visit (HOSPITAL_COMMUNITY)
Admission: RE | Admit: 2012-06-09 | Discharge: 2012-06-09 | Disposition: A | Discharge status: Home / Self Care | Payer: Medicare Other | Source: Ambulatory Visit | Attending: Internal Medicine | Admitting: Internal Medicine

## 2012-06-09 DIAGNOSIS — M109 Gout, unspecified: Secondary | ICD-10-CM

## 2012-06-09 DIAGNOSIS — IMO0001 Reserved for inherently not codable concepts without codable children: Secondary | ICD-10-CM | POA: Insufficient documentation

## 2012-06-09 DIAGNOSIS — R279 Unspecified lack of coordination: Secondary | ICD-10-CM | POA: Insufficient documentation

## 2012-06-09 DIAGNOSIS — M6281 Muscle weakness (generalized): Secondary | ICD-10-CM

## 2012-06-09 DIAGNOSIS — M25569 Pain in unspecified knee: Secondary | ICD-10-CM | POA: Insufficient documentation

## 2012-06-09 DIAGNOSIS — R262 Difficulty in walking, not elsewhere classified: Secondary | ICD-10-CM | POA: Insufficient documentation

## 2012-06-09 NOTE — Evaluation (Addendum)
Occupational Therapy Re-Evaluation  Patient Details  Name: Jack Irwin MRN: [id number] Date of Birth: [birth year]  Today's Date: 06/09/2012 Time: [id number] OT Time Calculation (min): 35 min Reassess 1300-1335 49'  Visit#: 6 of 12  Re-eval: 07/07/12     Authorization: Medicare  Authorization Time Period: before 16th visit  Authorization Visit#: 6 of 16   Past Medical History:  Past Medical History  Diagnosis Date  . Hypertension   . Diabetes mellitus   . Gout   . Chronic back pain   . BPH (benign prostatic hyperplasia)   . Anemia   . DVT (deep venous thrombosis)   . Neuropathy   . Decubitus ulcer     of 2nd toes of both feet.  . Edema leg   . Constipation   . Venous (peripheral) insufficiency   . Neuropathy, diabetic   . Physical deconditioning   . Poor balance   . Difficulty walking   . Lack of coordination   . Renal insufficiency   . Chronic kidney disease   . Chronic kidney disease (CKD), stage IV (severe)    Past Surgical History:  Past Surgical History  Procedure Laterality Date  . None      Subjective Symptoms/Limitations Symptoms: S: My hand is swollen. What can I do for that? I tried ice and that won't do anything. Special Tests: FAQ 51/71 = 70.8% independent 29.8% impaired Pain Assessment Currently in Pain?: Yes  Precautions/Restrictions  Precautions Precautions: None  Assessment  Sensation/Coordination/Edema Coordination 9 Hole Peg Test: L: 57"  (On eval: 1'11")  Additional Assessments RUE Strength RUE Overall Strength Comments: 4+/5 in given range (On eval: same) Grip (lbs): 32 (on eval: 25) Lateral Pinch: 15 lbs (on eval: 10) 3 Point Pinch: 14 lbs (on eval: 10) LUE Strength LUE Overall Strength Comments: 4+/5 (on eval: same) Grip (lbs): 15 (on eval: 11) Lateral Pinch: 13 lbs (on eval: 12) 3 Point Pinch: 4 lbs (on eval: 4) Right Hand Strength - Pinch (lbs) Lateral Pinch: 15 lbs (on eval: 10) 3 Point Pinch: 14 lbs (on eval:  10) Left Hand Strength - Pinch (lbs) Lateral Pinch: 13 lbs (on eval: 12) 3 Point Pinch: 4 lbs (on eval: 4)       Occupational Therapy Assessment and Plan OT Assessment and Plan Clinical Impression Statement: A: See MD note for progress.  OT Plan: P:  Continue to increase upper back strength for good posture to allow for increase ease with UE function. Patient's stated goals: be able to stand during church service without fatiguing, tieing shoes, buttoning shirt, turning faucet on/off, using hand can opener.  Goals Short Term Goals Time to Complete Short Term Goals: 3 weeks Short Term Goal 1: Patient will be educated on a HEP. Short Term Goal 1 Progress: Met Short Term Goal 2: Patient will increase upper back strength/scapular stability from fair to good for increased upright posture when walking. Short Term Goal 2 Progress: Met Short Term Goal 3: Patient will be able to complete BUE and BLE bathing activities in standing position. Short Term Goal 3 Progress: Met Short Term Goal 4: Patient will increase his sustained activity tolerance from fair to fair + for greater ability to complete laundry tasks. Short Term Goal 4 Progress: Progressing toward goal Short Term Goal 5: Patient will increase bilateral grip strength by 5 pounds and pinch strength by 2 pounds for increased ability to open containers. Short Term Goal 5 Progress: Progressing toward goal Additional Short Term Goals?: Yes Short Term Goal  6: Patient will increase fine motor coordination for increased independence opening toothpaste by decreasing completion time on Nine Hole Peg Test with his left hand by 20'. Short Term Goal 6 Progress: Met Long Term Goals Time to Complete Long Term Goals: 6 weeks Long Term Goal 1: Patient will improve his functional independence level to highest possible, demonstrated by increasing his score on the Functional Assessment Questionairre to 80% or better.  Long Term Goal 1 Progress: Progressing  toward goal Long Term Goal 2: Patient will be able to fasten buttons, tie shoes, manipulate zippers on his clothing independently. Long Term Goal 2 Progress: Progressing toward goal Long Term Goal 3: Patient will increase his upper back/scapular strength to good + for increased upright posture when walking.  Long Term Goal 3 Progress: Progressing toward goal Long Term Goal 4: Patient will increase his grip strength by 15 pounds and pinch strength by 5 pounds for increased ability to open containers. Long Term Goal 4 Progress: Progressing toward goal Long Term Goal 5: Patient will increase fine motor coordination needed to fasten buttons and tie shoes by decreasing completion time on Nine Hole Peg Test to 40" or less.   Long Term Goal 5 Progress: Progressing toward goal Additional Long Term Goals?: Yes Long Term Goal 6: Patient will have good - activity tolerance for greater participation in IADLs.  Long Term Goal 6 Progress: Progressing toward goal  Problem List Patient Active Problem List   Diagnosis Date Noted  . Lack of coordination 04/15/2012  . Muscle weakness (generalized) 04/15/2012  . Arthritis, gouty 04/15/2012  . Difficulty in walking 08/18/2011  . Weakness of both legs 08/18/2011  . Poor balance 08/18/2011    End of Session Activity Tolerance: Patient tolerated treatment well General Cognition: WFL for tasks performed  GO Functional Assessment Tool Used: Functional Assessment Questionnaire scored 70.8% Independent 29.8 impaired Functional Limitation: Self care Self Care Current Status [id number]): At least 20 percent but less than 40 percent impaired, limited or restricted Self Care Goal Status [id number]): At least 1 percent but less than 20 percent impaired, limited or restricted  Ailene Ravel, OTR/L,CBIS   06/09/2012, 1:58 PM  Physician Documentation Your signature is required to indicate approval of the treatment plan as stated above.  Please sign and either send  electronically or make a copy of this report for your files and return this physician signed original.  Please mark one 1.__approve of plan  2. ___approve of plan with the following conditions.   ______________________________                                                          _____________________ Physician Signature                                                                                                             Date

## 2012-06-16 ENCOUNTER — Ambulatory Visit (HOSPITAL_COMMUNITY)
Admission: RE | Admit: 2012-06-16 | Discharge: 2012-06-16 | Disposition: A | Discharge status: Home / Self Care | Payer: Medicare Other | Source: Ambulatory Visit | Attending: Internal Medicine | Admitting: Internal Medicine

## 2012-06-16 DIAGNOSIS — M109 Gout, unspecified: Secondary | ICD-10-CM

## 2012-06-16 DIAGNOSIS — R279 Unspecified lack of coordination: Secondary | ICD-10-CM

## 2012-06-16 DIAGNOSIS — M6281 Muscle weakness (generalized): Secondary | ICD-10-CM

## 2012-06-16 NOTE — Progress Notes (Signed)
Physical Therapy Treatment Patient Details  Name: Jack Irwin MRN: [id number] Date of Birth: 26-Jul-1965  Today's Date: 06/16/2012 Time: [id number] PT Time Calculation (min): 47 min Charges: 15' TE Visit#: 7 of 12  Re-eval: 07/10/12    Authorization: MEDICARE  Authorization Time Period:    Authorization Visit#: 7 of 10   Subjective: Symptoms/Limitations Symptoms: Pt reports that he is no longer using his W/C.  He is working hard on standing longer.   Pain Assessment Currently in Pain?: Yes Pain Score:   2  Precautions/Restrictions  Precautions Precautions: None  Exercise/Treatments Machines for Strengthening Cybex Knee Extension: BLE: 1.5 PL 10x  Cybex Knee Flexion: BLE 3.5 PL x15 reps Standing Gait Training: no AD: 10 minutes: 756 feet; long hallway 3 RT w/max VC and TC for posture  Supine Short Arc Quad Sets: Both;10 reps;Limitations Short Arc Quad Sets Limitations: 5 sec holds Bridges: 10 reps;Limitations Bridges Limitations: with iso hip adduction Straight Leg Raises: Both;10 reps;Limitations Straight Leg Raises Limitations: 3 sec holds Other Supine Knee Exercises: forward cruches x10 reps, oblique cruches 5 reps each direction  Physical Therapy Assessment and Plan PT Assessment and Plan Clinical Impression Statement: Pt able to complete a greater amount of time of ambulating with decreased gait speed.  added strengthening activities to improve core strength/posture and LE strength.  Pt requires max multimodal cueing for proper posture during activities.  Rehab Potential: Fair PT Frequency: Min 1X/week PT Duration: 4 weeks PT Plan: Continue to focus on activitiy tolerance and posture.  Add numbers, standing therabands, gait training on TM.  Pt requires reinforcement, and is working well with his HEP.      Goals    Problem List Patient Active Problem List   Diagnosis Date Noted  . Lack of coordination 04/15/2012  . Muscle weakness (generalized)  04/15/2012  . Arthritis, gouty 04/15/2012  . Difficulty in walking 08/18/2011  . Weakness of both legs 08/18/2011  . Poor balance 08/18/2011    General Cognition: WFL for tasks performed PT Plan of Care PT Home Exercise Plan: updated with advanced HEP able to complete in 10 minutes  PT Patient Instructions: Teach back for HEP Consulted and Agree with Plan of Care: Patient  GP    Keyondra Lagrand, MPT ATC 06/16/2012, 2:44 PM

## 2012-06-16 NOTE — Progress Notes (Signed)
Occupational Therapy Treatment Patient Details  Name: Jack Irwin MRN: [id number] Date of Birth: May 08, 1965  Today's Date: 06/16/2012 Time: [id number] OT Time Calculation (min): 36 min Therex [id number] 36'  Visit#: 7 of 12  Re-eval: 07/07/12    Authorization: Medicare  Authorization Time Period: before 16th visit  Authorization Visit#: 7 of 16  Subjective Symptoms/Limitations Symptoms: S: I want to be able to go back to work. My doctor Pain Assessment Currently in Pain?: Yes Pain Score:   2  Precautions/Restrictions  Precautions Precautions: None  Exercise/Treatments Standing Other Standing Exercises: With 1# Bil wrist weight patient stood at velcro number wall and retrievred numbers from wall at various levels requiring patient to reach and squat low. 2'25" Other Standing Exercises: With Bil 1# wrist weights patient performed squats to retrieve ball from crate on floor and reaching to place in crate above shoulder height. Alternating arms. 1 seated rest break.  ROM / Strengthening / Isometric Strengthening Other ROM/Strengthening Exercises: Nustep level 1 for 8 minutes to improve BUE sustained activity tolerance    Occupational Therapy Assessment and Plan OT Assessment and Plan Clinical Impression Statement: A: Pt complained of muscle soreness in left shoulder during shoulder activities. Education provided regarding staying hydrating and drinking enough water during the day. OT Plan: P:  Continue to increase upper back strength for good posture to allow for increase ease with UE function.   Goals Short Term Goals Time to Complete Short Term Goals: 3 weeks Short Term Goal 1: Patient will be educated on a HEP. Short Term Goal 2: Patient will increase upper back strength/scapular stability from fair to good for increased upright posture when walking. Short Term Goal 3: Patient will be able to complete BUE and BLE bathing activities in standing position. Short Term  Goal 4: Patient will increase his sustained activity tolerance from fair to fair + for greater ability to complete laundry tasks. Short Term Goal 4 Progress: Progressing toward goal Short Term Goal 5: Patient will increase bilateral grip strength by 5 pounds and pinch strength by 2 pounds for increased ability to open containers. Short Term Goal 5 Progress: Progressing toward goal Additional Short Term Goals?: Yes Short Term Goal 6: Patient will increase fine motor coordination for increased independence opening toothpaste by decreasing completion time on Nine Hole Peg Test with his left hand by 20'. Long Term Goals Time to Complete Long Term Goals: 6 weeks Long Term Goal 1: Patient will improve his functional independence level to highest possible, demonstrated by increasing his score on the Functional Assessment Questionairre to 80% or better.  Long Term Goal 1 Progress: Progressing toward goal Long Term Goal 2: Patient will be able to fasten buttons, tie shoes, manipulate zippers on his clothing independently. Long Term Goal 2 Progress: Progressing toward goal Long Term Goal 3: Patient will increase his upper back/scapular strength to good + for increased upright posture when walking.  Long Term Goal 3 Progress: Progressing toward goal Long Term Goal 4: Patient will increase his grip strength by 15 pounds and pinch strength by 5 pounds for increased ability to open containers. Long Term Goal 4 Progress: Progressing toward goal Long Term Goal 5: Patient will increase fine motor coordination needed to fasten buttons and tie shoes by decreasing completion time on Nine Hole Peg Test to 40" or less.   Long Term Goal 5 Progress: Progressing toward goal Additional Long Term Goals?: Yes Long Term Goal 6: Patient will have good - activity tolerance for greater participation in  IADLs.  Long Term Goal 6 Progress: Progressing toward goal  Problem List Patient Active Problem List   Diagnosis Date Noted   . Lack of coordination 04/15/2012  . Muscle weakness (generalized) 04/15/2012  . Arthritis, gouty 04/15/2012  . Difficulty in walking 08/18/2011  . Weakness of both legs 08/18/2011  . Poor balance 08/18/2011    End of Session Activity Tolerance: Patient tolerated treatment well General Cognition: WFL for tasks performed   Ailene Ravel, OTR/L,CBIS   06/16/2012, 1:48 PM

## 2012-06-23 ENCOUNTER — Inpatient Hospital Stay (HOSPITAL_COMMUNITY): Admission: RE | Admit: 2012-06-23 | Payer: Medicare Other | Source: Ambulatory Visit

## 2012-06-23 ENCOUNTER — Telehealth (HOSPITAL_COMMUNITY): Payer: Self-pay

## 2012-07-02 ENCOUNTER — Telehealth (HOSPITAL_COMMUNITY): Payer: Self-pay

## 2012-07-02 ENCOUNTER — Ambulatory Visit (HOSPITAL_COMMUNITY): Payer: Medicare Other

## 2012-07-05 ENCOUNTER — Emergency Department (HOSPITAL_COMMUNITY)
Admission: EM | Admit: 2012-07-05 | Discharge: 2012-07-05 | Disposition: A | Discharge status: Home / Self Care | Payer: Medicare Other | Attending: Emergency Medicine | Admitting: Emergency Medicine

## 2012-07-05 ENCOUNTER — Encounter (HOSPITAL_COMMUNITY): Payer: Self-pay | Admitting: *Deleted

## 2012-07-05 ENCOUNTER — Ambulatory Visit (HOSPITAL_COMMUNITY)
Admission: RE | Admit: 2012-07-05 | Discharge: 2012-07-05 | Disposition: A | Discharge status: Home / Self Care | Payer: Medicare Other | Source: Ambulatory Visit | Attending: Specialist | Admitting: Specialist

## 2012-07-05 ENCOUNTER — Other Ambulatory Visit: Payer: Self-pay

## 2012-07-05 ENCOUNTER — Ambulatory Visit (HOSPITAL_COMMUNITY)
Admission: RE | Admit: 2012-07-05 | Discharge: 2012-07-05 | Disposition: A | Discharge status: Home / Self Care | Payer: Medicare Other | Source: Ambulatory Visit | Attending: Internal Medicine | Admitting: Internal Medicine

## 2012-07-05 DIAGNOSIS — Z862 Personal history of diseases of the blood and blood-forming organs and certain disorders involving the immune mechanism: Secondary | ICD-10-CM | POA: Insufficient documentation

## 2012-07-05 DIAGNOSIS — E1149 Type 2 diabetes mellitus with other diabetic neurological complication: Secondary | ICD-10-CM | POA: Insufficient documentation

## 2012-07-05 DIAGNOSIS — G589 Mononeuropathy, unspecified: Secondary | ICD-10-CM | POA: Insufficient documentation

## 2012-07-05 DIAGNOSIS — Z8719 Personal history of other diseases of the digestive system: Secondary | ICD-10-CM | POA: Insufficient documentation

## 2012-07-05 DIAGNOSIS — M109 Gout, unspecified: Secondary | ICD-10-CM

## 2012-07-05 DIAGNOSIS — IMO0001 Reserved for inherently not codable concepts without codable children: Secondary | ICD-10-CM | POA: Insufficient documentation

## 2012-07-05 DIAGNOSIS — G8929 Other chronic pain: Secondary | ICD-10-CM | POA: Insufficient documentation

## 2012-07-05 DIAGNOSIS — I129 Hypertensive chronic kidney disease with stage 1 through stage 4 chronic kidney disease, or unspecified chronic kidney disease: Secondary | ICD-10-CM | POA: Insufficient documentation

## 2012-07-05 DIAGNOSIS — R279 Unspecified lack of coordination: Secondary | ICD-10-CM | POA: Insufficient documentation

## 2012-07-05 DIAGNOSIS — N184 Chronic kidney disease, stage 4 (severe): Secondary | ICD-10-CM | POA: Insufficient documentation

## 2012-07-05 DIAGNOSIS — M549 Dorsalgia, unspecified: Secondary | ICD-10-CM | POA: Insufficient documentation

## 2012-07-05 DIAGNOSIS — I10 Essential (primary) hypertension: Secondary | ICD-10-CM

## 2012-07-05 DIAGNOSIS — R262 Difficulty in walking, not elsewhere classified: Secondary | ICD-10-CM | POA: Insufficient documentation

## 2012-07-05 DIAGNOSIS — Z8679 Personal history of other diseases of the circulatory system: Secondary | ICD-10-CM | POA: Insufficient documentation

## 2012-07-05 DIAGNOSIS — L97509 Non-pressure chronic ulcer of other part of unspecified foot with unspecified severity: Secondary | ICD-10-CM | POA: Insufficient documentation

## 2012-07-05 DIAGNOSIS — Z8669 Personal history of other diseases of the nervous system and sense organs: Secondary | ICD-10-CM | POA: Insufficient documentation

## 2012-07-05 DIAGNOSIS — M25569 Pain in unspecified knee: Secondary | ICD-10-CM | POA: Insufficient documentation

## 2012-07-05 DIAGNOSIS — R51 Headache: Secondary | ICD-10-CM | POA: Insufficient documentation

## 2012-07-05 DIAGNOSIS — E1169 Type 2 diabetes mellitus with other specified complication: Secondary | ICD-10-CM | POA: Insufficient documentation

## 2012-07-05 DIAGNOSIS — Z86718 Personal history of other venous thrombosis and embolism: Secondary | ICD-10-CM | POA: Insufficient documentation

## 2012-07-05 DIAGNOSIS — M6281 Muscle weakness (generalized): Secondary | ICD-10-CM | POA: Insufficient documentation

## 2012-07-05 DIAGNOSIS — Z8739 Personal history of other diseases of the musculoskeletal system and connective tissue: Secondary | ICD-10-CM | POA: Insufficient documentation

## 2012-07-05 DIAGNOSIS — Z79899 Other long term (current) drug therapy: Secondary | ICD-10-CM | POA: Insufficient documentation

## 2012-07-05 DIAGNOSIS — Z87448 Personal history of other diseases of urinary system: Secondary | ICD-10-CM | POA: Insufficient documentation

## 2012-07-05 LAB — COMPREHENSIVE METABOLIC PANEL
ALT: 17 U/L (ref 0–53)
Albumin: 4 g/dL (ref 3.5–5.2)
Alkaline Phosphatase: 86 U/L (ref 39–117)
BUN: 47 mg/dL — ABNORMAL HIGH (ref 6–23)
Calcium: 9.6 mg/dL (ref 8.4–10.5)
GFR calc Af Amer: 25 mL/min — ABNORMAL LOW (ref >90)
Glucose, Bld: 68 mg/dL — ABNORMAL LOW (ref 70–99)
Potassium: 4.9 mEq/L (ref 3.5–5.1)
Sodium: 139 mEq/L (ref 135–145)
Total Protein: 8.3 g/dL (ref 6.0–8.3)

## 2012-07-05 LAB — CBC WITH DIFFERENTIAL/PLATELET
Basophils Relative: 1 % (ref 0–1)
Eosinophils Absolute: 0.2 10*3/uL (ref 0.0–0.7)
Eosinophils Relative: 2 % (ref 0–5)
Lymphs Abs: 2.2 10*3/uL (ref 0.7–4.0)
MCH: 27.2 pg (ref 26.0–34.0)
MCHC: 33.2 g/dL (ref 30.0–36.0)
MCV: 82 fL (ref 78.0–100.0)
Neutrophils Relative %: 66 % (ref 43–77)
Platelets: 245 10*3/uL (ref 150–400)
RDW: 16.8 % — ABNORMAL HIGH (ref 11.5–15.5)

## 2012-07-05 LAB — GLUCOSE, CAPILLARY: Glucose-Capillary: 69 mg/dL — ABNORMAL LOW (ref 70–99)

## 2012-07-05 MED ORDER — HYDROCODONE-ACETAMINOPHEN 5-325 MG PO TABS
1.0000 | ORAL_TABLET | Freq: Once | ORAL | Status: AC
  Administered 2012-07-05: 1 via ORAL
  Filled 2012-07-05: qty 1

## 2012-07-05 MED ORDER — HYDROCODONE-ACETAMINOPHEN 5-325 MG PO TABS: 1.0000 | ORAL_TABLET | Freq: Four times a day (QID) | ORAL | Status: DC | PRN

## 2012-07-05 NOTE — Progress Notes (Addendum)
Physical Therapy Discharge  Patient Details  Name: Jack Irwin MRN: [id number] Date of Birth: 1965/10/28  Today's Date: 07/05/2012 Time: 1440-1500 PT Time Calculation (min): 20 min Charges: 20' Self Care Visit#: 8 of 12  Re-eval: 07/10/12 Assessment Diagnosis: Generalized weakness Next MD Visit: Dr,. Avbuere - March 31st  Authorization: MEDICARE  Authorization Time Period:    Authorization Visit#: 8 of 10   Subjective: Symptoms/Limitations Symptoms: Pt reports that he has been dizzy.  BP 166/98.  He reports he has been working on standing and walking at home.  How long can you stand comfortably?: 5-10 minutes (was 2-3) How long can you walk comfortably?: 5-10 minutes (was 2-3 minutes) Pain Assessment Currently in Pain?: No/denies  Physical Therapy Assessment and Plan PT Assessment and Plan Clinical Impression Statement: Jack Irwin has attended 8 OP PT visits over the past 11 weeks with 6 cancellations/no shows.  Addressed the cancellation and n/s rate after first re-eval and decreased to 1x per week and pt continues to have difficulty with attending appointments due to transportation and finacial restritions.  At this time pt's seated BP is 166/98 and is having difficulty maintaining alterness and do not feel he would be able to complete standardized testing for D/C.  Discussed with pt in detail that pt has recieved all education needed for his diagnosis of generalized weakness and needs to continue with strengthening and activity tolerance on his own.  Has been given assistance with attending the [facility] for 2 weeks free of charge and has not been able to use due to difficulty with transportation.  He now reports he is standing and walking longer and is no longer using his W/C.  Encouraged him to continue this at home to work towards his goals.  Will d/c from PT and escorted pt to ED due to BP issues.  Reports unable to f/u with PCP due to finicial obligations he is unable to pay.   Rehab Potential: Fair PT Frequency: Min 1X/week PT Duration: 4 weeks PT Plan: D/C    Goals Home Exercise Program Pt will Perform Home Exercise Program: Independently PT Goal: Perform Home Exercise Program - Progress: Met PT Short Term Goals Time to Complete Short Term Goals: 2 weeks PT Short Term Goal 1: Pt will improve his activity tolerance and ambulate independently for 10 minutes without rest break with moderate cueing for appropriate posture.  (5 minutes ) PT Short Term Goal 1 - Progress: Progressing toward goal PT Short Term Goal 2: Pt will improve his BLE knee flexibility and demonstrate knee flexion to 3 degrees when standing for improved body mechanics when walking.  PT Short Term Goal 2 - Progress: Progressing toward goal PT Short Term Goal 3: Pt will improve his TUG to 13 seconds for safety while walking in the community.   PT Short Term Goal 3 - Progress: Met PT Short Term Goal 4: Pt will improve his activity tolerance and ambulate x30 minutes with trunk flexion less than 10 degrees in order to ambulate safely in the community to improve QOL.   PT Short Term Goal 4 - Progress: Progressing toward goal PT Long Term Goals Time to Complete Long Term Goals: 8 weeks PT Long Term Goal 1: Pt will improve his BLE strength to [facility] in order to ascend and descend stairs forward with 1 handrail with step to pattern in order to safely enter family and friends home. PT Long Term Goal 1 - Progress: Progressing toward goal PT Long Term Goal 2: Pt  will improve his gait speed and ambulate 700 feet in two minutes for age approrpriate speed.  (654 feet in 5 minutes, 334 ft in 2 minutes) PT Long Term Goal 2 - Progress: Not met Long Term Goal 3: Pt will improve his DGI to 14/24 for improved safety with ambulating in the community.  (15/24) Long Term Goal 3 Progress: Met Long Term Goal 4: Pt will improve his FAQ to 50/64 for improved percieved functional ability.  (58/64) Long Term Goal 4 Progress:  Met PT Long Term Goal 5: Pt will improve his core strength in order to tolerate standing for greater than 15 minutes in order to independently take a shower and perform self care activities in the bathroom.  Long Term Goal 5 Progress: Progressing toward goal  Problem List Patient Active Problem List   Diagnosis Date Noted  . Lack of coordination 04/15/2012  . Muscle weakness (generalized) 04/15/2012  . Arthritis, gouty 04/15/2012  . Difficulty in walking 08/18/2011  . Weakness of both legs 08/18/2011  . Poor balance 08/18/2011    General Cognition: WFL for tasks performed PT Plan of Care PT Patient Instructions: Teach back for HEP, encouraged to attend [employer] when BP is under control.  Consulted and Agree with Plan of Care: Patient  GP   [id number] [facility] PT THERAPY MOBILITY GOAL STATUS 07/05/2012 Camila Li, PT GP, CJ 1 [phone] [facility] PT THERAPY MOBILITY DISCHARGE STATUS 07/05/2012 Camila Li, PT GP, CK   Mical Kicklighter, MPT, ATC 07/05/2012, 3:17 PM

## 2012-07-05 NOTE — ED Notes (Signed)
Electronic signature pad not working - Teacher, English as a foreign language for patients signature and he signed it.

## 2012-07-05 NOTE — Progress Notes (Signed)
Occupational Therapy Treatment Patient Details  Name: Jack Irwin MRN: [id number] Date of Birth: 1965-08-05  Today's Date: 07/05/2012 Time: 1350-1430 OT Time Calculation (min): 40 min Reassessment 1350-1420 67' Selfcare for HEP instruction 1420-1430 10' Visit#: 8 of 12  Re-eval: 07/07/12    Authorization: Medicare  Authorization Time Period: before 16th visit  Authorization Visit#: 8 of 16  Subjective S:  So you are turning me loose.  I only do my exercises once a week.  Pertinent History: Had a long discussion with patient about being compliant with attending therapy and completing his HEP.  Mr. Goos is frustrated that he is being dc from therapy, however, he is only attending therapy one time a week or less, and is only completing his HEP one time a week.  We discussed the fact that his therapy is no longer skilled, he is able to work on strengthening of his hands at home, rather than coming to therapy.   Special Tests: FAQ 63% was 70%.  Pain Assessment Currently in Pain?: No/denies  Precautions/Restrictions   n'a  Exercise/Treatments Hand Exercises Theraputty - Flatten: pink Theraputty - Roll: pink Theraputty - Grip: pink Theraputty - Pinch: pink       Occupational Therapy Assessment and Plan OT Assessment and Plan Clinical Impression Statement: A:  DC reassessment completed this date:  shoulder strength 5/5 (4+/5), right grip strength 32# (32#), right lateral pinch strength 15# (15#), right 3 point pinch strength 11# (14#), left grip strength 25# (15#), left lateral pinch strength 11# (13#), left 3-point pinch strength 6# (4#).   OT Plan: P:  DC from skilled OT intervention, as patient is making minimal progress towards his goals in occupational therapy.  He has been educated on a HEP for grip strengthening and in hand manipuation skills.  I have reiterated the importance of completing his HEP on a daily basis in order to create change in a postive manner     Goals Short Term Goals Time to Complete Short Term Goals: 3 weeks Short Term Goal 1: Patient will be educated on a HEP. Short Term Goal 1 Progress: Met Short Term Goal 2: Patient will increase upper back strength/scapular stability from fair to good for increased upright posture when walking. Short Term Goal 2 Progress: Met Short Term Goal 3: Patient will be able to complete BUE and BLE bathing activities in standing position. Short Term Goal 3 Progress: Met Short Term Goal 4: Patient will increase his sustained activity tolerance from fair to fair + for greater ability to complete laundry tasks. Short Term Goal 4 Progress: Progressing toward goal Short Term Goal 5: Patient will increase bilateral grip strength by 5 pounds and pinch strength by 2 pounds for increased ability to open containers. Short Term Goal 5 Progress: Progressing toward goal Additional Short Term Goals?: Yes Short Term Goal 6: Patient will increase fine motor coordination for increased independence opening toothpaste by decreasing completion time on Nine Hole Peg Test with his left hand by 20'. Short Term Goal 6 Progress: Met Long Term Goals Time to Complete Long Term Goals: 6 weeks Long Term Goal 1: Patient will improve his functional independence level to highest possible, demonstrated by increasing his score on the Functional Assessment Questionairre to 80% or better.  Long Term Goal 1 Progress: Progressing toward goal Long Term Goal 2: Patient will be able to fasten buttons, tie shoes, manipulate zippers on his clothing independently. Long Term Goal 2 Progress: Met Long Term Goal 3: Patient will increase his  upper back/scapular strength to good + for increased upright posture when walking.  Long Term Goal 3 Progress: Progressing toward goal Long Term Goal 4: Patient will increase his grip strength by 15 pounds and pinch strength by 5 pounds for increased ability to open containers. Long Term Goal 4 Progress:  Progressing toward goal Long Term Goal 5: Patient will increase fine motor coordination needed to fasten buttons and tie shoes by decreasing completion time on Nine Hole Peg Test to 40" or less.   Long Term Goal 5 Progress: Progressing toward goal Additional Long Term Goals?: Yes Long Term Goal 6: Patient will have good - activity tolerance for greater participation in IADLs.  Long Term Goal 6 Progress: Progressing toward goal  Problem List Patient Active Problem List   Diagnosis Date Noted  . Lack of coordination 04/15/2012  . Muscle weakness (generalized) 04/15/2012  . Arthritis, gouty 04/15/2012  . Difficulty in walking 08/18/2011  . Weakness of both legs 08/18/2011  . Poor balance 08/18/2011    End of Session Activity Tolerance: Patient tolerated treatment well General Behavior During Therapy: WFL for tasks assessed/performed Cognition: WFL for tasks performed OT Plan of Care OT Home Exercise Plan: pink tputty for grip strnegthening and sponges for grip strengthening and in hand manipulation training.  GO Functional Assessment Tool Used: Functional Assessment Questionnaire scored 63% Independent and was 70% Independent at previous reassessment Functional Limitation: Self care Self Care Goal Status [id number]): At least 1 percent but less than 20 percent impaired, limited or restricted Self Care Discharge Status [phone]): At least 20 percent but less than 40 percent impaired, limited or restricted  Vangie Bicker, OTR/L  07/05/2012, 2:48 PM

## 2012-07-05 NOTE — ED Notes (Signed)
Pt went to PT today , says he was dizzy and headache,,bp was up 166/98

## 2012-07-05 NOTE — ED Notes (Signed)
Patient has been fed dinner of chicken, Jack Irwin, vegetable, also has had a total of 4 packs of graham crackers and a sprite.  This is what he has informed me he has eaten since his glucose level was obtained.

## 2012-07-05 NOTE — ED Notes (Signed)
Meal given per vo dr zammit

## 2012-07-05 NOTE — ED Provider Notes (Signed)
History     CSN: [id number]  Arrival date & time 07/05/12  1502   First MD Initiated Contact with Patient 07/05/12 1614      Chief Complaint  Patient presents with  . Dizziness    (Consider location/radiation/quality/duration/timing/severity/associated sxs/prior treatment) Patient is a 47 y.o. male presenting with headaches. The history is provided by the patient (the pt complains of a headache and dizziness). No language interpreter was used.  Headache Pain location:  Frontal Quality:  Dull Radiates to:  Does not radiate Severity currently:  3/10 Severity at highest:  6/10 Onset quality:  Gradual Timing:  Intermittent Progression:  Waxing and waning Associated symptoms: no abdominal pain, no back pain, no congestion, no cough, no diarrhea, no fatigue, no seizures and no sinus pressure     Past Medical History  Diagnosis Date  . Hypertension   . Diabetes mellitus   . Gout   . Chronic back pain   . BPH (benign prostatic hyperplasia)   . Anemia   . DVT (deep venous thrombosis)   . Neuropathy   . Decubitus ulcer     of 2nd toes of both feet.  . Edema leg   . Constipation   . Venous (peripheral) insufficiency   . Neuropathy, diabetic   . Physical deconditioning   . Poor balance   . Difficulty walking   . Lack of coordination   . Renal insufficiency   . Chronic kidney disease   . Chronic kidney disease (CKD), stage IV (severe)     Past Surgical History  Procedure Laterality Date  . None      Family History  Problem Relation Age of Onset  . Diabetes Mother   . Hypertension Mother   . Heart failure Mother   . Hyperlipidemia Mother   . Cancer Father   . Diabetes Father   . Hypertension Father   . Hyperlipidemia Father     History  Substance Use Topics  . Smoking status: Never Smoker   . Smokeless tobacco: Never Used  . Alcohol Use: No      Review of Systems  Constitutional: Negative for appetite change and fatigue.  HENT: Negative for  congestion, sinus pressure and ear discharge.   Eyes: Negative for discharge.  Respiratory: Negative for cough.   Cardiovascular: Negative for chest pain.  Gastrointestinal: Negative for abdominal pain and diarrhea.  Genitourinary: Negative for frequency and hematuria.  Musculoskeletal: Negative for back pain.  Skin: Negative for rash.  Neurological: Positive for headaches. Negative for seizures.  Psychiatric/Behavioral: Negative for hallucinations.    Allergies  Review of patient's allergies indicates no known allergies.  Home Medications   Current Outpatient Rx  Name  Route  Sig  Dispense  Refill  . calcium acetate (PHOSLO) 667 MG capsule   Oral   Take 1,334 mg by mouth 3 (three) times daily with meals. BINDER         . colchicine 0.6 MG tablet   Oral   Take 0.6 mg by mouth 2 (two) times daily as needed. For GOUT PAIN         . Febuxostat (ULORIC) 80 MG TABS   Oral   Take 1 tablet by mouth daily as needed. For GOUT/HIGH URIC ACID         . HYDROcodone-acetaminophen (NORCO) 5-325 MG per tablet   Oral   Take 2 tablets by mouth every 4 (four) hours as needed for pain.   15 tablet   0   . HYDROcodone-acetaminophen (  NORCO/VICODIN) 5-325 MG per tablet   Oral   Take 1 tablet by mouth every 6 (six) hours as needed for pain.   10 tablet   0   . methocarbamol (ROBAXIN) 500 MG tablet   Oral   Take 2 tablets (1,000 mg total) by mouth 4 (four) times daily as needed (muscle spasm/pain).   25 tablet   0   . metoCLOPramide (REGLAN) 10 MG tablet   Oral   Take 1 tablet (10 mg total) by mouth 3 (three) times daily with meals as needed.   30 tablet   0   . metoprolol (LOPRESSOR) 50 MG tablet   Oral   Take 50 mg by mouth daily. For HEART         . sildenafil (VIAGRA) 100 MG tablet   Oral   Take 100 mg by mouth daily as needed for erectile dysfunction.           BP 153/84  Pulse 65  Temp(Src) 97.4 F (36.3 C) (Oral)  Resp 20  Ht 5\' 11"  (1.803 m)  Wt 290 lb  (131.543 kg)  BMI 40.46 kg/m2  SpO2 97%  Physical Exam  Constitutional: He is oriented to person, place, and time. He appears well-developed.  HENT:  Head: Normocephalic.  Eyes: Conjunctivae and EOM are normal. No scleral icterus.  Neck: Neck supple. No thyromegaly present.  Cardiovascular: Normal rate and regular rhythm.  Exam reveals no gallop and no friction rub.   No murmur heard. Pulmonary/Chest: No stridor. He has no wheezes. He has no rales. He exhibits no tenderness.  Abdominal: He exhibits no distension. There is no tenderness. There is no rebound.  Musculoskeletal: Normal range of motion. He exhibits no edema.  Lymphadenopathy:    He has no cervical adenopathy.  Neurological: He is oriented to person, place, and time. Coordination normal.  Skin: No rash noted. No erythema.  Psychiatric: He has a normal mood and affect. His behavior is normal.    ED Course  Procedures (including critical care time)  Labs Reviewed  CBC WITH DIFFERENTIAL - Abnormal; Notable for the following:    RDW 16.8 (*)    All other components within normal limits  COMPREHENSIVE METABOLIC PANEL - Abnormal; Notable for the following:    Glucose, Bld 68 (*)    BUN 47 (*)    Creatinine, Ser 3.28 (*)    GFR calc non Af Amer 21 (*)    GFR calc Af Amer 25 (*)    All other components within normal limits  GLUCOSE, CAPILLARY - Abnormal; Notable for the following:    Glucose-Capillary 69 (*)    All other components within normal limits  TROPONIN I   No results found.   1. Hypertension     Date: 07/05/2012  Rate: 49  Rhythm: normal sinus rhythm  QRS Axis: normal  Intervals: normal  ST/T Wave abnormalities: normal  Conduction Disutrbances:none  Narrative Interpretation:   Old EKG Reviewed: none available     MDM          Maudry Diego, MD 07/05/12 1910

## 2012-08-15 IMAGING — CR DG CHEST 2V
2 series · 2 of 2 positions shown · non-contrast
Comparison: CT chest 11/03/2009.  Portable chest 10/12/2010.

CLINICAL DATA: Cough and nausea.

CHEST - 2 VIEW

[view not recorded (1 of 2)]
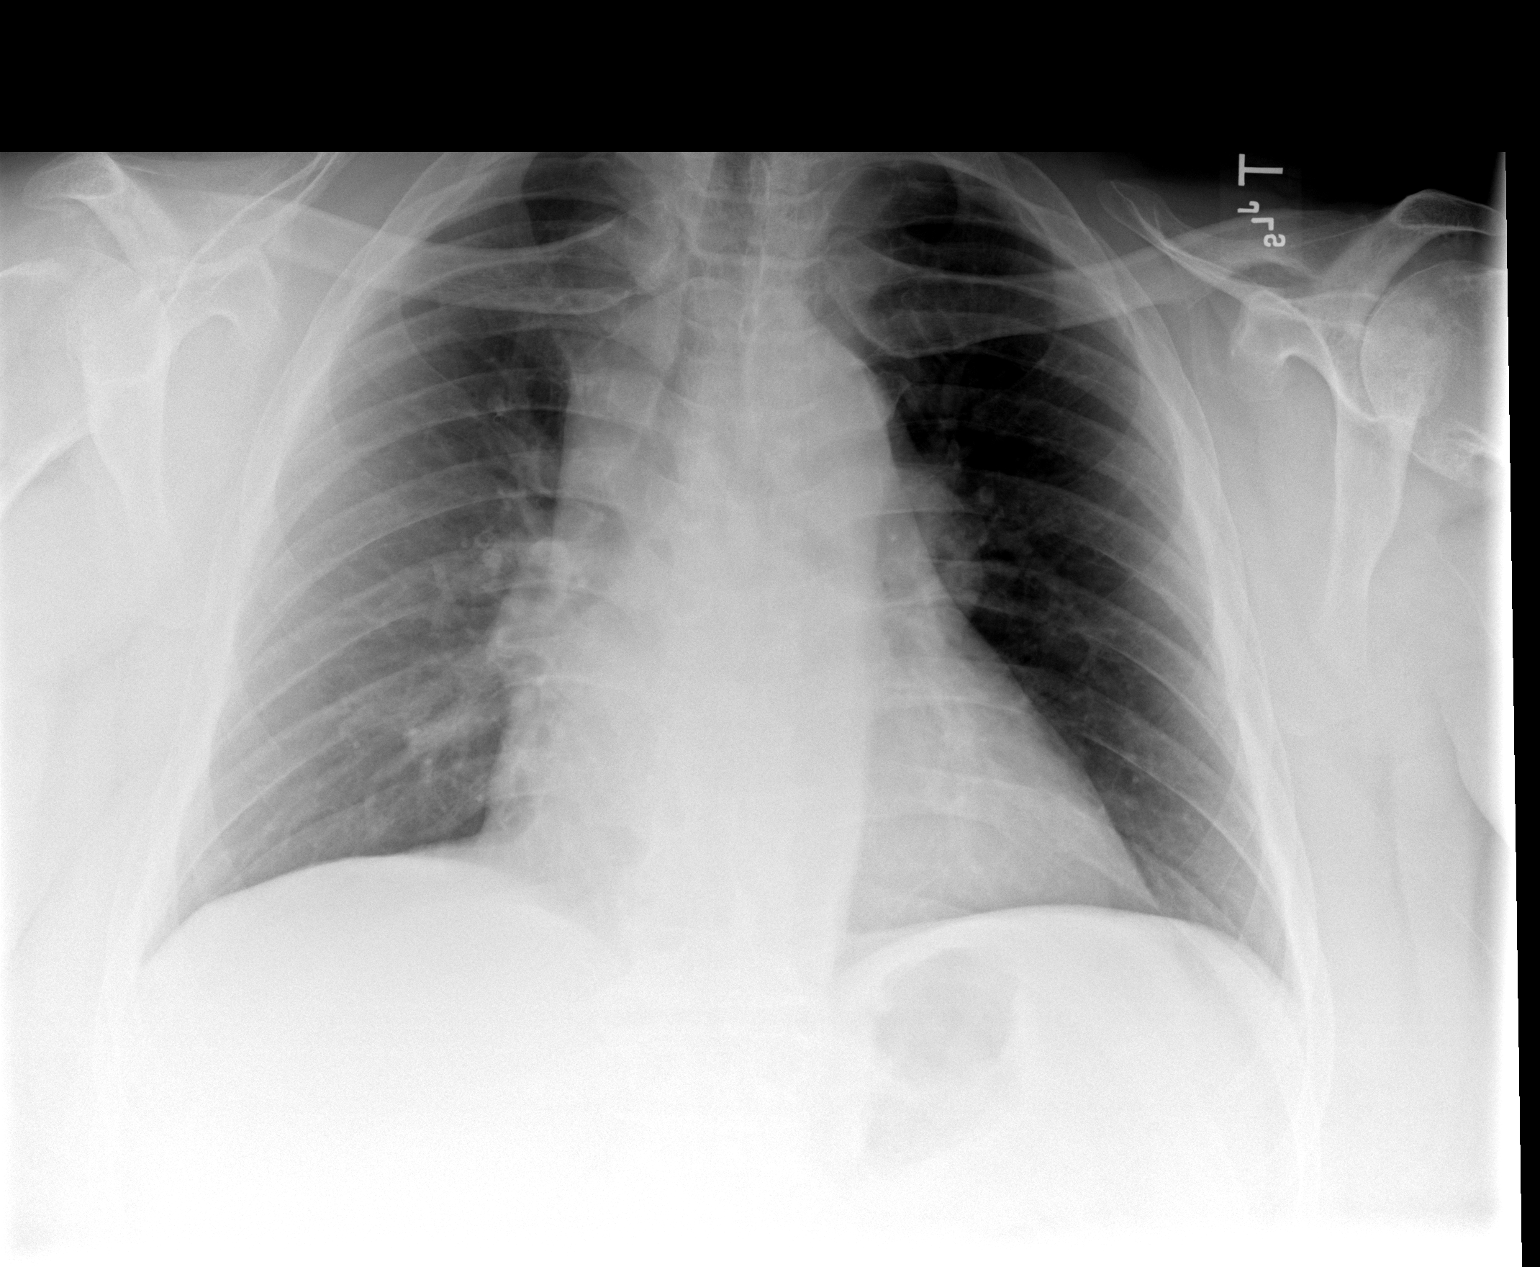

[view not recorded (2 of 2)]
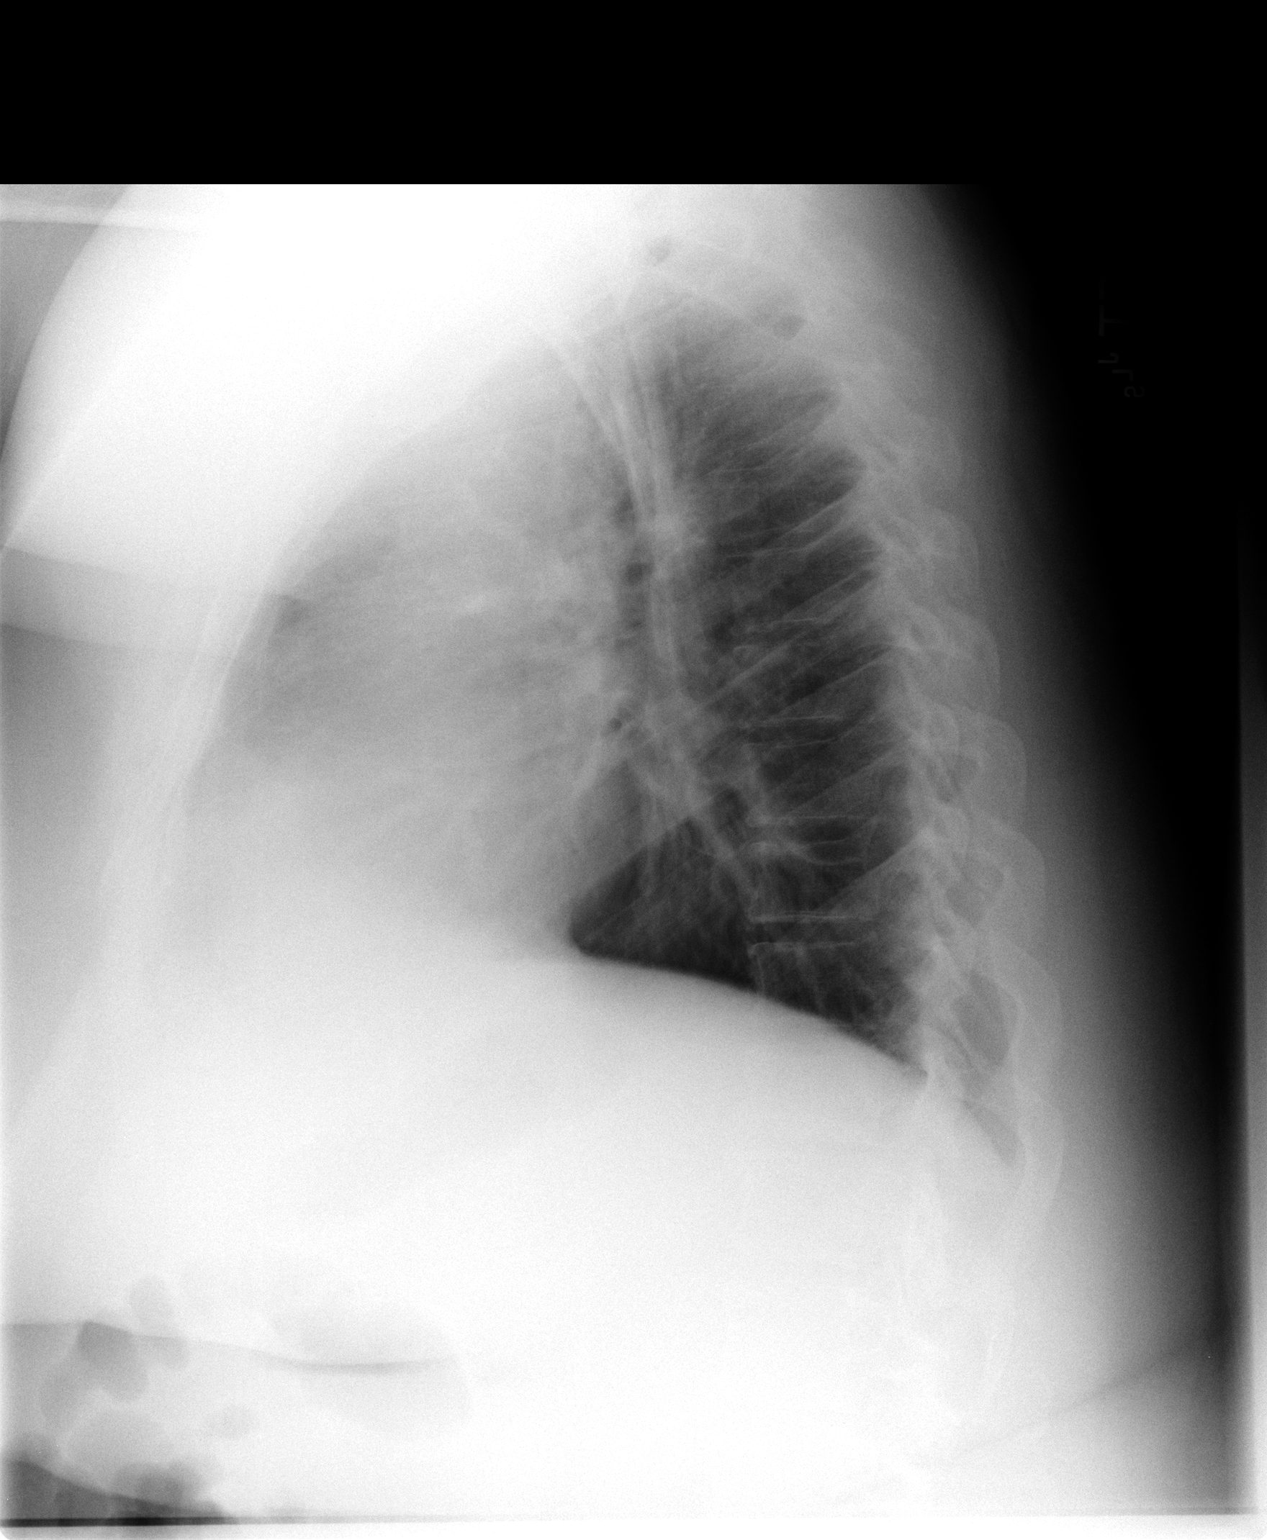

[2 of 2 positions shown; findings below may reference images not displayed]

FINDINGS: There is cardiomegaly.  Lungs are clear.  No pneumothorax
or pleural effusion.  Markedly advanced for age degenerative
disease about the left shoulder is noted.
IMPRESSION: No acute abnormality.

## 2012-08-23 ENCOUNTER — Emergency Department (HOSPITAL_COMMUNITY)
Admission: EM | Admit: 2012-08-23 | Discharge: 2012-08-23 | Disposition: A | Discharge status: Home / Self Care | Payer: Medicare Other | Attending: Emergency Medicine | Admitting: Emergency Medicine

## 2012-08-23 ENCOUNTER — Encounter (HOSPITAL_COMMUNITY): Payer: Self-pay

## 2012-08-23 DIAGNOSIS — N184 Chronic kidney disease, stage 4 (severe): Secondary | ICD-10-CM | POA: Insufficient documentation

## 2012-08-23 DIAGNOSIS — E1142 Type 2 diabetes mellitus with diabetic polyneuropathy: Secondary | ICD-10-CM | POA: Insufficient documentation

## 2012-08-23 DIAGNOSIS — Z86718 Personal history of other venous thrombosis and embolism: Secondary | ICD-10-CM | POA: Insufficient documentation

## 2012-08-23 DIAGNOSIS — K5900 Constipation, unspecified: Secondary | ICD-10-CM | POA: Insufficient documentation

## 2012-08-23 DIAGNOSIS — Z872 Personal history of diseases of the skin and subcutaneous tissue: Secondary | ICD-10-CM | POA: Insufficient documentation

## 2012-08-23 DIAGNOSIS — I129 Hypertensive chronic kidney disease with stage 1 through stage 4 chronic kidney disease, or unspecified chronic kidney disease: Secondary | ICD-10-CM | POA: Insufficient documentation

## 2012-08-23 DIAGNOSIS — M109 Gout, unspecified: Secondary | ICD-10-CM | POA: Insufficient documentation

## 2012-08-23 DIAGNOSIS — Z87448 Personal history of other diseases of urinary system: Secondary | ICD-10-CM | POA: Insufficient documentation

## 2012-08-23 DIAGNOSIS — Z79899 Other long term (current) drug therapy: Secondary | ICD-10-CM | POA: Insufficient documentation

## 2012-08-23 DIAGNOSIS — E1149 Type 2 diabetes mellitus with other diabetic neurological complication: Secondary | ICD-10-CM | POA: Insufficient documentation

## 2012-08-23 DIAGNOSIS — Z8679 Personal history of other diseases of the circulatory system: Secondary | ICD-10-CM | POA: Insufficient documentation

## 2012-08-23 DIAGNOSIS — E119 Type 2 diabetes mellitus without complications: Secondary | ICD-10-CM | POA: Insufficient documentation

## 2012-08-23 DIAGNOSIS — Z8669 Personal history of other diseases of the nervous system and sense organs: Secondary | ICD-10-CM | POA: Insufficient documentation

## 2012-08-23 DIAGNOSIS — G8929 Other chronic pain: Secondary | ICD-10-CM | POA: Insufficient documentation

## 2012-08-23 DIAGNOSIS — R109 Unspecified abdominal pain: Secondary | ICD-10-CM | POA: Insufficient documentation

## 2012-08-23 DIAGNOSIS — Z862 Personal history of diseases of the blood and blood-forming organs and certain disorders involving the immune mechanism: Secondary | ICD-10-CM | POA: Insufficient documentation

## 2012-08-23 DIAGNOSIS — R110 Nausea: Secondary | ICD-10-CM | POA: Insufficient documentation

## 2012-08-23 LAB — CBC WITH DIFFERENTIAL/PLATELET
Basophils Absolute: 0 10*3/uL (ref 0.0–0.1)
Basophils Relative: 0 % (ref 0–1)
Lymphocytes Relative: 22 % (ref 12–46)
MCHC: 32.2 g/dL (ref 30.0–36.0)
Neutro Abs: 6.6 10*3/uL (ref 1.7–7.7)
Neutrophils Relative %: 68 % (ref 43–77)
Platelets: 202 10*3/uL (ref 150–400)
RDW: 17.4 % — ABNORMAL HIGH (ref 11.5–15.5)
WBC: 9.7 10*3/uL (ref 4.0–10.5)

## 2012-08-23 LAB — URINALYSIS, ROUTINE W REFLEX MICROSCOPIC
Ketones, ur: NEGATIVE mg/dL
Leukocytes, UA: NEGATIVE
Nitrite: NEGATIVE
Protein, ur: NEGATIVE mg/dL

## 2012-08-23 LAB — BASIC METABOLIC PANEL
Chloride: 107 mEq/L (ref 96–112)
Creatinine, Ser: 3.57 mg/dL — ABNORMAL HIGH (ref 0.50–1.35)
GFR calc Af Amer: 22 mL/min — ABNORMAL LOW (ref >90)
Potassium: 4.8 mEq/L (ref 3.5–5.1)
Sodium: 138 mEq/L (ref 135–145)

## 2012-08-23 LAB — GLUCOSE, CAPILLARY: Glucose-Capillary: 112 mg/dL — ABNORMAL HIGH (ref 70–99)

## 2012-08-23 MED ORDER — ONDANSETRON 8 MG PO TBDP: 8.0000 mg | ORAL_TABLET | Freq: Three times a day (TID) | ORAL | Status: DC | PRN

## 2012-08-23 MED ORDER — BACITRACIN ZINC 500 UNIT/GM EX OINT
TOPICAL_OINTMENT | CUTANEOUS | Status: AC
  Administered 2012-08-23: 2
  Filled 2012-08-23: qty 1.8

## 2012-08-23 NOTE — ED Notes (Addendum)
Pt c/o abd pain x 2 weeks.  Denies n/v/d. Pt reports had very small bm this morning.   Says has been constipated. Pt also reports has blisters to finger tips that have "popped."

## 2012-08-23 NOTE — ED Notes (Signed)
Patient in room complaining about dressing his fingers and but no dressing orde was placed and was comfirmed by dr.campos. Patient then proceeded to call nurse Eveleigh Crumpler crazy and that:' they got the right bitch working tonight.' so patient was discharge via charge nurse susan.

## 2012-08-23 NOTE — ED Provider Notes (Signed)
History  This chart was scribed for Hoy Morn, MD by Mikel Cella, ED Scribe. This patient was seen in room APA05/APA05 and the patient's care was started at 1701.  CSN: [id number] Arrival date & time 08/23/12  1545   First MD Initiated Contact with Patient 08/23/12 1701     Chief Complaint  Patient presents with  . Abdominal Pain  . Wound Check    Patient is a 47 y.o. male presenting with abdominal pain. The history is provided by the patient. No language interpreter was used.  Abdominal Pain This is a new problem. The current episode started more than 1 week ago. The problem occurs constantly. The problem has not changed since onset.Nothing aggravates the symptoms. Nothing relieves the symptoms. He has tried nothing for the symptoms. The treatment provided no relief.    HPI Comments: Jack Irwin is a 47 y.o. male with a h/o DM, HTN, constipation and gout who presents to the Emergency Department complaining of gradual onset, unchanging, intermittent abdominal pain that began 2 weeks ago. He has associated nausea and constipation. He locates the pain to his periumbilical region. He states the abdominal pain is aggravated by eating. He states he had a very small BM this morning. He states he has not had a normal BM in 2 weeks. He denies any diarrhea and emesis. He denies doing anything for the constipation.   Past Medical History  Diagnosis Date  . Hypertension   . Diabetes mellitus   . Gout   . Chronic back pain   . BPH (benign prostatic hyperplasia)   . Anemia   . DVT (deep venous thrombosis)   . Neuropathy   . Decubitus ulcer     of 2nd toes of both feet.  . Edema leg   . Constipation   . Venous (peripheral) insufficiency   . Neuropathy, diabetic   . Physical deconditioning   . Poor balance   . Difficulty walking   . Lack of coordination   . Renal insufficiency   . Chronic kidney disease   . Chronic kidney disease (CKD), stage IV (severe)    Past  Surgical History  Procedure Laterality Date  . None     Family History  Problem Relation Age of Onset  . Diabetes Mother   . Hypertension Mother   . Heart failure Mother   . Hyperlipidemia Mother   . Cancer Father   . Diabetes Father   . Hypertension Father   . Hyperlipidemia Father    History  Substance Use Topics  . Smoking status: Never Smoker   . Smokeless tobacco: Never Used  . Alcohol Use: No    Review of Systems  All other systems reviewed and are negative.    A complete 10 system review of systems was obtained and all systems are negative except as noted in the HPI and PMH.    Allergies  Review of patient's allergies indicates no known allergies.  Home Medications   Current Outpatient Rx  Name  Route  Sig  Dispense  Refill  . calcium acetate (PHOSLO) 667 MG capsule   Oral   Take 1,334 mg by mouth 3 (three) times daily with meals. BINDER         . colchicine 0.6 MG tablet   Oral   Take 0.6 mg by mouth 2 (two) times daily as needed. For GOUT PAIN         . Febuxostat (ULORIC) 80 MG TABS   Oral  Take 1 tablet by mouth daily as needed. For GOUT/HIGH URIC ACID         . HYDROcodone-acetaminophen (NORCO/VICODIN) 5-325 MG per tablet   Oral   Take 1 tablet by mouth every 6 (six) hours as needed for pain.   10 tablet   0   . methocarbamol (ROBAXIN) 500 MG tablet   Oral   Take 2 tablets (1,000 mg total) by mouth 4 (four) times daily as needed (muscle spasm/pain).   25 tablet   0   . metoCLOPramide (REGLAN) 10 MG tablet   Oral   Take 1 tablet (10 mg total) by mouth 3 (three) times daily with meals as needed.   30 tablet   0   . metoprolol (LOPRESSOR) 50 MG tablet   Oral   Take 50 mg by mouth daily. For HEART         . ondansetron (ZOFRAN ODT) 8 MG disintegrating tablet   Oral   Take 1 tablet (8 mg total) by mouth every 8 (eight) hours as needed for nausea.   10 tablet   0   . sildenafil (VIAGRA) 100 MG tablet   Oral   Take 100 mg by  mouth daily as needed for erectile dysfunction.          Triage Vitals: BP 143/85  Pulse 61  Temp(Src) 98.1 F (36.7 C) (Oral)  SpO2 100%  Physical Exam  Nursing note and vitals reviewed. Constitutional: He is oriented to person, place, and time. He appears well-developed and well-nourished.  HENT:  Head: Normocephalic and atraumatic.  Eyes: EOM are normal.  Neck: Normal range of motion.  Cardiovascular: Normal rate, regular rhythm, normal heart sounds and intact distal pulses.   Pulmonary/Chest: Effort normal and breath sounds normal. No respiratory distress.  Abdominal: Soft. Bowel sounds are normal. He exhibits no distension and no mass. There is no tenderness. There is no rebound and no guarding.  Genitourinary: Rectum normal.  Musculoskeletal: Normal range of motion.  Neurological: He is alert and oriented to person, place, and time.  Skin: Skin is warm and dry.  Psychiatric: He has a normal mood and affect. Judgment normal.    ED Course  Procedures (including critical care time)  DIAGNOSTIC STUDIES: Oxygen Saturation is 100% on room air, normal by my interpretation.    COORDINATION OF CARE:  5:34 PM-Discussed treatment plan which includes glucose check, CBC, BMP and UA with pt at bedside and pt agreed to plan.   Labs Reviewed  GLUCOSE, CAPILLARY - Abnormal; Notable for the following:    Glucose-Capillary 112 (*)    All other components within normal limits  CBC WITH DIFFERENTIAL - Abnormal; Notable for the following:    Hemoglobin 12.1 (*)    HCT 37.6 (*)    RDW 17.4 (*)    All other components within normal limits  BASIC METABOLIC PANEL - Abnormal; Notable for the following:    BUN 53 (*)    Creatinine, Ser 3.57 (*)    GFR calc non Af Amer 19 (*)    GFR calc Af Amer 22 (*)    All other components within normal limits  URINALYSIS, ROUTINE W REFLEX MICROSCOPIC   No results found. 1. Abdominal pain   2. Nausea     MDM  8:00 PM The patient is  well-appearing.  This is baseline renal insufficiency for him.  He has had abdominal pain for some time.  This is more of her chronic complaint and is an acute  complaint.  I've encouraged him to followup with his primary care physician.  He did have several small blisters on all of his fingertips without secondary signs of infection.  Bacitracin was applied to these.  He will apply antibacterial ointment home.  His main reason for coming in today was more his intermittent abdominal pain over the past several years.  I also think a part of his visit today was because he was hungry and he says he is running out of money and does not have the money to buy lunch.  He requests a sandwich when he showed up..  His overall well appearing.  His labs are all reassuring.  I do not think additional testing needs to be done today.  Medical screening examination completed.  Close PCP followup.   I personally performed the services described in this documentation, which was scribed in my presence. The recorded information has been reviewed and is accurate.      Hoy Morn, MD 08/23/12 2000

## 2012-10-28 ENCOUNTER — Emergency Department (HOSPITAL_COMMUNITY): Payer: Medicare Other

## 2012-10-28 ENCOUNTER — Emergency Department (HOSPITAL_COMMUNITY)
Admission: EM | Admit: 2012-10-28 | Discharge: 2012-10-28 | Disposition: A | Discharge status: Home / Self Care | Payer: Medicare Other | Attending: Emergency Medicine | Admitting: Emergency Medicine

## 2012-10-28 ENCOUNTER — Encounter (HOSPITAL_COMMUNITY): Payer: Self-pay

## 2012-10-28 DIAGNOSIS — I129 Hypertensive chronic kidney disease with stage 1 through stage 4 chronic kidney disease, or unspecified chronic kidney disease: Secondary | ICD-10-CM | POA: Insufficient documentation

## 2012-10-28 DIAGNOSIS — R110 Nausea: Secondary | ICD-10-CM

## 2012-10-28 DIAGNOSIS — R51 Headache: Secondary | ICD-10-CM | POA: Insufficient documentation

## 2012-10-28 DIAGNOSIS — Z8719 Personal history of other diseases of the digestive system: Secondary | ICD-10-CM | POA: Insufficient documentation

## 2012-10-28 DIAGNOSIS — Z872 Personal history of diseases of the skin and subcutaneous tissue: Secondary | ICD-10-CM | POA: Insufficient documentation

## 2012-10-28 DIAGNOSIS — G8929 Other chronic pain: Secondary | ICD-10-CM | POA: Insufficient documentation

## 2012-10-28 DIAGNOSIS — Z86718 Personal history of other venous thrombosis and embolism: Secondary | ICD-10-CM | POA: Insufficient documentation

## 2012-10-28 DIAGNOSIS — G589 Mononeuropathy, unspecified: Secondary | ICD-10-CM | POA: Insufficient documentation

## 2012-10-28 DIAGNOSIS — E1149 Type 2 diabetes mellitus with other diabetic neurological complication: Secondary | ICD-10-CM | POA: Insufficient documentation

## 2012-10-28 DIAGNOSIS — R112 Nausea with vomiting, unspecified: Secondary | ICD-10-CM | POA: Insufficient documentation

## 2012-10-28 DIAGNOSIS — R42 Dizziness and giddiness: Secondary | ICD-10-CM | POA: Insufficient documentation

## 2012-10-28 DIAGNOSIS — E162 Hypoglycemia, unspecified: Secondary | ICD-10-CM

## 2012-10-28 DIAGNOSIS — Z87448 Personal history of other diseases of urinary system: Secondary | ICD-10-CM | POA: Insufficient documentation

## 2012-10-28 DIAGNOSIS — N184 Chronic kidney disease, stage 4 (severe): Secondary | ICD-10-CM | POA: Insufficient documentation

## 2012-10-28 DIAGNOSIS — Z79899 Other long term (current) drug therapy: Secondary | ICD-10-CM | POA: Insufficient documentation

## 2012-10-28 DIAGNOSIS — Z8669 Personal history of other diseases of the nervous system and sense organs: Secondary | ICD-10-CM | POA: Insufficient documentation

## 2012-10-28 LAB — CBC WITH DIFFERENTIAL/PLATELET
Basophils Absolute: 0 10*3/uL (ref 0.0–0.1)
HCT: 38.1 % — ABNORMAL LOW (ref 39.0–52.0)
Hemoglobin: 12.1 g/dL — ABNORMAL LOW (ref 13.0–17.0)
Lymphocytes Relative: 21 % (ref 12–46)
Lymphs Abs: 1.9 10*3/uL (ref 0.7–4.0)
Monocytes Absolute: 0.7 10*3/uL (ref 0.1–1.0)
Neutro Abs: 6.4 10*3/uL (ref 1.7–7.7)
RBC: 4.48 MIL/uL (ref 4.22–5.81)
RDW: 16.8 % — ABNORMAL HIGH (ref 11.5–15.5)
WBC: 9.1 10*3/uL (ref 4.0–10.5)

## 2012-10-28 LAB — COMPREHENSIVE METABOLIC PANEL
ALT: 11 U/L (ref 0–53)
AST: 24 U/L (ref 0–37)
CO2: 24 mEq/L (ref 19–32)
Chloride: 103 mEq/L (ref 96–112)
Creatinine, Ser: 3.39 mg/dL — ABNORMAL HIGH (ref 0.50–1.35)
GFR calc non Af Amer: 20 mL/min — ABNORMAL LOW (ref >90)
Total Bilirubin: 0.7 mg/dL (ref 0.3–1.2)

## 2012-10-28 MED ORDER — KETOROLAC TROMETHAMINE 30 MG/ML IJ SOLN
30.0000 mg | Freq: Once | INTRAMUSCULAR | Status: AC
  Administered 2012-10-28: 30 mg via INTRAVENOUS
  Filled 2012-10-28: qty 1

## 2012-10-28 MED ORDER — ONDANSETRON HCL 4 MG/2ML IJ SOLN
4.0000 mg | Freq: Once | INTRAMUSCULAR | Status: AC
  Administered 2012-10-28: 4 mg via INTRAVENOUS
  Filled 2012-10-28: qty 2

## 2012-10-28 MED ORDER — PROMETHAZINE HCL 25 MG PO TABS: 25.0000 mg | ORAL_TABLET | Freq: Four times a day (QID) | ORAL | Status: DC | PRN

## 2012-10-28 MED ORDER — SODIUM CHLORIDE 0.9 % IV BOLUS (SEPSIS)
1000.0000 mL | Freq: Once | INTRAVENOUS | Status: AC
  Administered 2012-10-28: 1000 mL via INTRAVENOUS

## 2012-10-28 NOTE — ED Notes (Signed)
nad noted prior to dc. Dc instructions reviewed. 1 script given.

## 2012-10-28 NOTE — ED Provider Notes (Signed)
CSN: [id number]     Arrival date & time 10/28/12  1413 History  This chart was scribed for Maudry Diego, MD by Roxan Diesel, ED scribe.  This patient was seen in room APA09/APA09 and the patient's care was started at 2:56 PM.   Chief Complaint  Patient presents with  . Abdominal Pain  . Dizziness    Patient is a 47 y.o. male presenting with abdominal pain. The history is provided by the patient. No language interpreter was used.  Abdominal Pain Pain location:  Periumbilical Pain radiates to:  Does not radiate Pain severity now: Moderate-to-severe. Onset quality:  Gradual Duration:  2 hours Progression:  Worsening Chronicity:  New Ineffective treatments:  None tried Associated symptoms: no chest pain, no cough, no diarrhea, no fatigue and no hematuria   Risk factors comment:  Diabetes   HPI Comments: Jack Irwin is a 47 y.o. male with h/o DM, stage IV CKD, anemia and HTN who presents to the Emergency Department complaining of 2 days of moderate-to-severe periumbilical abdominal pain with associated non-specific dizziness, headache, nausea and vomiting.  Pt states he has not attempted to treat symptoms because he cannot afford any medications.  he denies diarrhea.   Past Medical History  Diagnosis Date  . Hypertension   . Diabetes mellitus   . Gout   . Chronic back pain   . BPH (benign prostatic hyperplasia)   . Anemia   . DVT (deep venous thrombosis)   . Neuropathy   . Decubitus ulcer     of 2nd toes of both feet.  . Edema leg   . Constipation   . Venous (peripheral) insufficiency   . Neuropathy, diabetic   . Physical deconditioning   . Poor balance   . Difficulty walking   . Lack of coordination   . Renal insufficiency   . Chronic kidney disease   . Chronic kidney disease (CKD), stage IV (severe)     Past Surgical History  Procedure Laterality Date  . None      Family History  Problem Relation Age of Onset  . Diabetes Mother   . Hypertension  Mother   . Heart failure Mother   . Hyperlipidemia Mother   . Cancer Father   . Diabetes Father   . Hypertension Father   . Hyperlipidemia Father     History  Substance Use Topics  . Smoking status: Never Smoker   . Smokeless tobacco: Never Used  . Alcohol Use: No     Review of Systems  Constitutional: Negative for appetite change and fatigue.  HENT: Negative for congestion, sinus pressure and ear discharge.   Eyes: Negative for discharge.  Respiratory: Negative for cough.   Cardiovascular: Negative for chest pain.  Gastrointestinal: Negative for abdominal pain and diarrhea.  Genitourinary: Negative for frequency and hematuria.  Musculoskeletal: Negative for back pain.  Skin: Negative for rash.  Neurological: Negative for seizures and headaches.  Psychiatric/Behavioral: Negative for hallucinations.     Allergies  Review of patient's allergies indicates no known allergies.  Home Medications   Current Outpatient Rx  Name  Route  Sig  Dispense  Refill  . calcium acetate (PHOSLO) 667 MG capsule   Oral   Take 1,334 mg by mouth 3 (three) times daily with meals. BINDER         . colchicine 0.6 MG tablet   Oral   Take 0.6 mg by mouth 2 (two) times daily as needed. For GOUT PAIN         .  Febuxostat (ULORIC) 80 MG TABS   Oral   Take 1 tablet by mouth daily as needed. For GOUT/HIGH URIC ACID         . HYDROcodone-acetaminophen (NORCO/VICODIN) 5-325 MG per tablet   Oral   Take 1 tablet by mouth every 6 (six) hours as needed for pain.   10 tablet   0   . methocarbamol (ROBAXIN) 500 MG tablet   Oral   Take 2 tablets (1,000 mg total) by mouth 4 (four) times daily as needed (muscle spasm/pain).   25 tablet   0   . metoCLOPramide (REGLAN) 10 MG tablet   Oral   Take 1 tablet (10 mg total) by mouth 3 (three) times daily with meals as needed.   30 tablet   0   . metoprolol (LOPRESSOR) 50 MG tablet   Oral   Take 50 mg by mouth daily. For HEART         .  ondansetron (ZOFRAN ODT) 8 MG disintegrating tablet   Oral   Take 1 tablet (8 mg total) by mouth every 8 (eight) hours as needed for nausea.   10 tablet   0   . sildenafil (VIAGRA) 100 MG tablet   Oral   Take 100 mg by mouth daily as needed for erectile dysfunction.          BP 151/99  Pulse 56  Temp(Src) 98.3 F (36.8 C) (Oral)  Resp 20  Ht 5\' 11"  (1.803 m)  Wt 278 lb 9 oz (126.355 kg)  BMI 38.87 kg/m2  SpO2 100%  Physical Exam  Nursing note and vitals reviewed. Constitutional: He is oriented to person, place, and time. He appears well-developed.  HENT:  Head: Normocephalic.  Eyes: Conjunctivae and EOM are normal. No scleral icterus.  Neck: Neck supple. No thyromegaly present.  Cardiovascular: Normal rate and regular rhythm.  Exam reveals no gallop and no friction rub.   No murmur heard. Pulmonary/Chest: No stridor. He has no wheezes. He has no rales. He exhibits no tenderness.  Abdominal: Soft. Bowel sounds are normal. He exhibits no distension. There is tenderness. There is no rebound.  Mild periumbilical tenderness  Musculoskeletal: Normal range of motion. He exhibits no edema.  Lymphadenopathy:    He has no cervical adenopathy.  Neurological: He is oriented to person, place, and time. He exhibits normal muscle tone. Coordination normal.  Skin: No rash noted. No erythema.  Psychiatric: He has a normal mood and affect. His behavior is normal.    ED Course  Procedures (including critical care time)  DIAGNOSTIC STUDIES: Oxygen Saturation is 100% on room air, normal by my interpretation.    COORDINATION OF CARE: 2:58 PM-Discussed treatment plan which includes IV fluids, pain medication, anti-emetics, abdomen imaging and labs with pt at bedside and pt agreed to plan.    Labs Review Labs Reviewed - No data to display  Imaging Review No results found.  MDM  No diagnosis found.    The chart was scribed for me under my direct supervision.  I personally  performed the history, physical, and medical decision making and all procedures in the evaluation of this patient.Maudry Diego, MD 10/28/12 [phone]

## 2012-10-28 NOTE — ED Notes (Signed)
Pt c/o abd pain ,vomiting, dizziness, and headache since yesterday.

## 2012-11-02 ENCOUNTER — Other Ambulatory Visit: Payer: Self-pay

## 2012-11-02 ENCOUNTER — Encounter (HOSPITAL_COMMUNITY): Payer: Self-pay | Admitting: *Deleted

## 2012-11-02 ENCOUNTER — Emergency Department (HOSPITAL_COMMUNITY)
Admission: EM | Admit: 2012-11-02 | Discharge: 2012-11-02 | Disposition: A | Discharge status: Home / Self Care | Payer: Medicare Other | Attending: Emergency Medicine | Admitting: Emergency Medicine

## 2012-11-02 DIAGNOSIS — R42 Dizziness and giddiness: Secondary | ICD-10-CM

## 2012-11-02 DIAGNOSIS — R21 Rash and other nonspecific skin eruption: Secondary | ICD-10-CM | POA: Insufficient documentation

## 2012-11-02 DIAGNOSIS — R519 Headache, unspecified: Secondary | ICD-10-CM

## 2012-11-02 DIAGNOSIS — N184 Chronic kidney disease, stage 4 (severe): Secondary | ICD-10-CM | POA: Insufficient documentation

## 2012-11-02 DIAGNOSIS — M129 Arthropathy, unspecified: Secondary | ICD-10-CM | POA: Insufficient documentation

## 2012-11-02 DIAGNOSIS — E1149 Type 2 diabetes mellitus with other diabetic neurological complication: Secondary | ICD-10-CM | POA: Insufficient documentation

## 2012-11-02 DIAGNOSIS — Z872 Personal history of diseases of the skin and subcutaneous tissue: Secondary | ICD-10-CM | POA: Insufficient documentation

## 2012-11-02 DIAGNOSIS — K089 Disorder of teeth and supporting structures, unspecified: Secondary | ICD-10-CM | POA: Insufficient documentation

## 2012-11-02 DIAGNOSIS — Z79899 Other long term (current) drug therapy: Secondary | ICD-10-CM | POA: Insufficient documentation

## 2012-11-02 DIAGNOSIS — E1142 Type 2 diabetes mellitus with diabetic polyneuropathy: Secondary | ICD-10-CM | POA: Insufficient documentation

## 2012-11-02 DIAGNOSIS — M109 Gout, unspecified: Secondary | ICD-10-CM | POA: Insufficient documentation

## 2012-11-02 DIAGNOSIS — I129 Hypertensive chronic kidney disease with stage 1 through stage 4 chronic kidney disease, or unspecified chronic kidney disease: Secondary | ICD-10-CM | POA: Insufficient documentation

## 2012-11-02 DIAGNOSIS — R51 Headache: Secondary | ICD-10-CM | POA: Insufficient documentation

## 2012-11-02 DIAGNOSIS — Z760 Encounter for issue of repeat prescription: Secondary | ICD-10-CM | POA: Insufficient documentation

## 2012-11-02 DIAGNOSIS — Z86718 Personal history of other venous thrombosis and embolism: Secondary | ICD-10-CM | POA: Insufficient documentation

## 2012-11-02 DIAGNOSIS — Z862 Personal history of diseases of the blood and blood-forming organs and certain disorders involving the immune mechanism: Secondary | ICD-10-CM | POA: Insufficient documentation

## 2012-11-02 DIAGNOSIS — K0889 Other specified disorders of teeth and supporting structures: Secondary | ICD-10-CM

## 2012-11-02 DIAGNOSIS — G8929 Other chronic pain: Secondary | ICD-10-CM | POA: Insufficient documentation

## 2012-11-02 HISTORY — DX: Unspecified osteoarthritis, unspecified site: M19.90

## 2012-11-02 MED ORDER — ACETAMINOPHEN 325 MG PO TABS
650.0000 mg | ORAL_TABLET | Freq: Once | ORAL | Status: AC
  Administered 2012-11-02: 650 mg via ORAL
  Filled 2012-11-02: qty 2

## 2012-11-02 MED ORDER — METOPROLOL TARTRATE 50 MG PO TABS: 50.0000 mg | ORAL_TABLET | Freq: Every day | ORAL | Status: DC

## 2012-11-02 MED ORDER — PENICILLIN V POTASSIUM 500 MG PO TABS: 500.0000 mg | ORAL_TABLET | Freq: Four times a day (QID) | ORAL | Status: AC

## 2012-11-02 MED ORDER — PENICILLIN V POTASSIUM 250 MG PO TABS
500.0000 mg | ORAL_TABLET | Freq: Once | ORAL | Status: AC
  Administered 2012-11-02: 500 mg via ORAL
  Filled 2012-11-02: qty 2

## 2012-11-02 NOTE — ED Provider Notes (Signed)
CSN: [id number]     Arrival date & time 11/02/12  1549 History   First MD Initiated Contact with Patient 11/02/12 1649     Chief Complaint  Patient presents with  . Dizziness   Patient is a 47 y.o. male presenting with tooth pain. The history is provided by the patient.  Dental Pain Location:  Upper Severity:  Mild Onset quality:  Gradual Duration:  1 week Timing:  Constant Progression:  Worsening Chronicity:  New Relieved by:  Nothing Exacerbated by: chewing. Associated symptoms: headaches   Associated symptoms: no fever   pt presents for multiple complaints He reports he has had dental pain for one week.  No fever/vomiting He also reports gradual onset of HA for 3 days (no vomiting/fever/weakness/visual changes) He reports mild dizziness as well but no focal weakness and no LOC No cp/sob but he reports "heart is pounding" and reports he is out of his metoprolol  Past Medical History  Diagnosis Date  . Hypertension   . Diabetes mellitus   . Gout   . Chronic back pain   . BPH (benign prostatic hyperplasia)   . Anemia   . DVT (deep venous thrombosis)   . Neuropathy   . Decubitus ulcer     of 2nd toes of both feet.  . Edema leg   . Constipation   . Venous (peripheral) insufficiency   . Neuropathy, diabetic   . Physical deconditioning   . Poor balance   . Difficulty walking   . Lack of coordination   . Renal insufficiency   . Chronic kidney disease   . Chronic kidney disease (CKD), stage IV (severe)   . Arthritis    Past Surgical History  Procedure Laterality Date  . None     Family History  Problem Relation Age of Onset  . Diabetes Mother   . Hypertension Mother   . Heart failure Mother   . Hyperlipidemia Mother   . Cancer Father   . Diabetes Father   . Hypertension Father   . Hyperlipidemia Father    History  Substance Use Topics  . Smoking status: Never Smoker   . Smokeless tobacco: Never Used  . Alcohol Use: No    Review of Systems   Constitutional: Negative for fever.  Respiratory: Negative for shortness of breath.   Cardiovascular: Positive for palpitations. Negative for chest pain.  Gastrointestinal: Negative for vomiting.  Neurological: Positive for headaches.  All other systems reviewed and are negative.    Allergies  Review of patient's allergies indicates no known allergies.  Home Medications   Current Outpatient Rx  Name  Route  Sig  Dispense  Refill  . calcium acetate (PHOSLO) 667 MG capsule   Oral   Take 1,334 mg by mouth 3 (three) times daily with meals. BINDER         . colchicine 0.6 MG tablet   Oral   Take 0.6 mg by mouth 2 (two) times daily as needed. For GOUT PAIN         . Febuxostat (ULORIC) 80 MG TABS   Oral   Take 1 tablet by mouth daily as needed. For GOUT/HIGH URIC ACID         . HYDROcodone-acetaminophen (NORCO/VICODIN) 5-325 MG per tablet   Oral   Take 1 tablet by mouth every 6 (six) hours as needed for pain.   10 tablet   0   . methocarbamol (ROBAXIN) 500 MG tablet   Oral   Take 2 tablets (  1,000 mg total) by mouth 4 (four) times daily as needed (muscle spasm/pain).   25 tablet   0   . metoCLOPramide (REGLAN) 10 MG tablet   Oral   Take 1 tablet (10 mg total) by mouth 3 (three) times daily with meals as needed.   30 tablet   0   . metoprolol (LOPRESSOR) 50 MG tablet   Oral   Take 1 tablet (50 mg total) by mouth daily.   7 tablet   0   . ondansetron (ZOFRAN ODT) 8 MG disintegrating tablet   Oral   Take 1 tablet (8 mg total) by mouth every 8 (eight) hours as needed for nausea.   10 tablet   0   . penicillin v potassium (VEETID) 500 MG tablet   Oral   Take 1 tablet (500 mg total) by mouth 4 (four) times daily.   40 tablet   0   . promethazine (PHENERGAN) 25 MG tablet   Oral   Take 1 tablet (25 mg total) by mouth every 6 (six) hours as needed for nausea.   15 tablet   0   . sildenafil (VIAGRA) 100 MG tablet   Oral   Take 100 mg by mouth daily as  needed for erectile dysfunction.          BP 132/93  Pulse 90  Temp(Src) 98.9 F (37.2 C) (Oral)  Resp 16  Ht 5\' 11"  (1.803 m)  Wt 278 lb (126.1 kg)  BMI 38.79 kg/m2  SpO2 98% Physical Exam CONSTITUTIONAL: Well developed/well nourished HEAD: Normocephalic/atraumatic EYES: EOMI/PERRL, no nystagmus ENMT: Mucous membranes moist, poor dentition, decayed teeth noted, no focal abscess, no trismus, no stridor NECK: supple no meningeal signs, no bruits SPINE:entire spine nontender CV: S1/S2 noted, no murmurs/rubs/gallops noted LUNGS: Lungs are clear to auscultation bilaterally, no apparent distress ABDOMEN: soft, nontender, no rebound or guarding, he is obese GU:no cva tenderness NEURO:Awake/alert, facies symmetric, no arm or leg drift is noted Cranial nerves 3/4/5/6/08/11/08/11/12 tested and intact Gait normal without ataxia No past pointing EXTREMITIES: pulses normal, full ROM SKIN: warm, color normal PSYCH: no abnormalities of mood noted   ED Course  Procedures  MDM   1. Pain, dental   2. Headache   3. Dizziness   4. Medication refill    Nursing notes including past medical history and social history reviewed and considered in documentation  Will re-prescribe his lopressor and also add on PCN for his dental decay He has no focal weakness, and given he reports gradual onset of HA and no focal weakness doubt acute neurologic event Stable for d/c home   Date: 11/02/2012  Rate: 90  Rhythm: normal sinus rhythm  QRS Axis: normal  Intervals: normal  ST/T Wave abnormalities: nonspecific ST changes  Conduction Disutrbances:none  Narrative Interpretation:   Old EKG Reviewed: unchanged from 07/05/12    Sharyon Cable, MD 11/02/12 1756

## 2012-11-02 NOTE — ED Notes (Signed)
Dental pain, out of bp meds, Headache, dizzy,

## 2012-11-14 IMAGING — CR DG CHEST 1V PORT
1 series · 1 of 1 positions shown · non-contrast
Comparison: 02/12/2011

CLINICAL DATA: Cough, headache.

PORTABLE CHEST - 1 VIEW

[view not recorded]
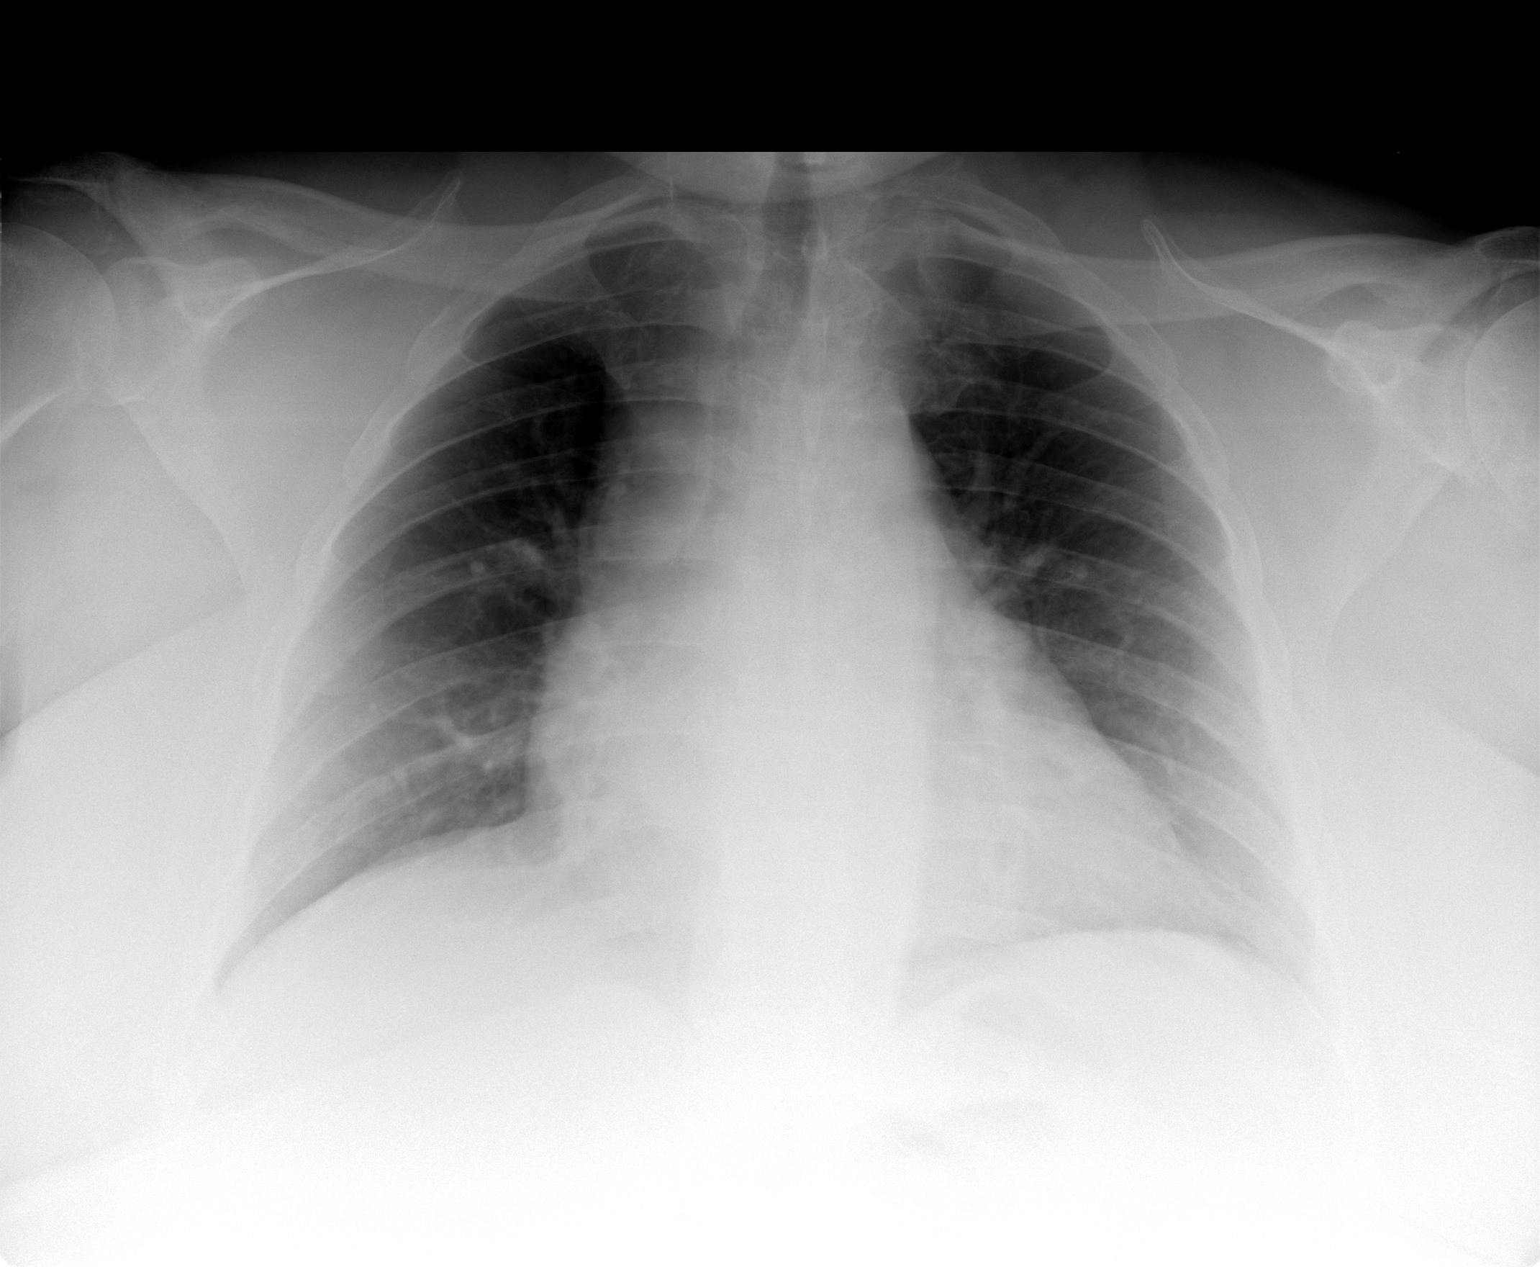

[1 of 1 positions shown; findings below may reference images not displayed]

FINDINGS: Cardiomegaly.  Lungs are clear.  No effusions or acute
bony abnormality.
IMPRESSION: Cardiomegaly.  No active disease.

## 2012-11-18 NOTE — Addendum Note (Signed)
Encounter addended by: Camila Li, PT on: 11/18/2012  1:22 PM<BR>     Documentation filed: Charges VN, Flowsheet VN

## 2012-11-18 NOTE — Addendum Note (Signed)
Encounter addended by: Camila Li, PT on: 11/18/2012  1:26 PM<BR>     Documentation filed: Charges VN, Clinical Notes

## 2012-11-29 ENCOUNTER — Encounter (HOSPITAL_COMMUNITY): Payer: Self-pay | Admitting: Emergency Medicine

## 2012-11-29 ENCOUNTER — Emergency Department (HOSPITAL_COMMUNITY)
Admission: EM | Admit: 2012-11-29 | Discharge: 2012-11-29 | Disposition: A | Discharge status: Home / Self Care | Payer: Medicare Other | Attending: Emergency Medicine | Admitting: Emergency Medicine

## 2012-11-29 DIAGNOSIS — E1149 Type 2 diabetes mellitus with other diabetic neurological complication: Secondary | ICD-10-CM | POA: Insufficient documentation

## 2012-11-29 DIAGNOSIS — R45851 Suicidal ideations: Secondary | ICD-10-CM | POA: Insufficient documentation

## 2012-11-29 DIAGNOSIS — R1013 Epigastric pain: Secondary | ICD-10-CM | POA: Insufficient documentation

## 2012-11-29 DIAGNOSIS — I129 Hypertensive chronic kidney disease with stage 1 through stage 4 chronic kidney disease, or unspecified chronic kidney disease: Secondary | ICD-10-CM | POA: Insufficient documentation

## 2012-11-29 DIAGNOSIS — Z86718 Personal history of other venous thrombosis and embolism: Secondary | ICD-10-CM | POA: Insufficient documentation

## 2012-11-29 DIAGNOSIS — G8929 Other chronic pain: Secondary | ICD-10-CM | POA: Insufficient documentation

## 2012-11-29 DIAGNOSIS — M109 Gout, unspecified: Secondary | ICD-10-CM | POA: Insufficient documentation

## 2012-11-29 DIAGNOSIS — M129 Arthropathy, unspecified: Secondary | ICD-10-CM | POA: Insufficient documentation

## 2012-11-29 DIAGNOSIS — Z79899 Other long term (current) drug therapy: Secondary | ICD-10-CM | POA: Insufficient documentation

## 2012-11-29 DIAGNOSIS — K5900 Constipation, unspecified: Secondary | ICD-10-CM | POA: Insufficient documentation

## 2012-11-29 DIAGNOSIS — E1142 Type 2 diabetes mellitus with diabetic polyneuropathy: Secondary | ICD-10-CM | POA: Insufficient documentation

## 2012-11-29 DIAGNOSIS — N184 Chronic kidney disease, stage 4 (severe): Secondary | ICD-10-CM | POA: Insufficient documentation

## 2012-11-29 DIAGNOSIS — Z862 Personal history of diseases of the blood and blood-forming organs and certain disorders involving the immune mechanism: Secondary | ICD-10-CM | POA: Insufficient documentation

## 2012-11-29 DIAGNOSIS — Z872 Personal history of diseases of the skin and subcutaneous tissue: Secondary | ICD-10-CM | POA: Insufficient documentation

## 2012-11-29 LAB — CBC WITH DIFFERENTIAL/PLATELET
Eosinophils Absolute: 0.1 10*3/uL (ref 0.0–0.7)
Eosinophils Relative: 1 % (ref 0–5)
HCT: 40.1 % (ref 39.0–52.0)
Hemoglobin: 13 g/dL (ref 13.0–17.0)
Lymphocytes Relative: 25 % (ref 12–46)
Lymphs Abs: 2.3 10*3/uL (ref 0.7–4.0)
MCH: 27.8 pg (ref 26.0–34.0)
MCV: 85.7 fL (ref 78.0–100.0)
Monocytes Absolute: 0.7 10*3/uL (ref 0.1–1.0)
Monocytes Relative: 8 % (ref 3–12)
Platelets: 232 10*3/uL (ref 150–400)
RBC: 4.68 MIL/uL (ref 4.22–5.81)
RDW: 16.6 % — ABNORMAL HIGH (ref 11.5–15.5)
WBC: 9 10*3/uL (ref 4.0–10.5)

## 2012-11-29 LAB — COMPREHENSIVE METABOLIC PANEL
AST: 23 U/L (ref 0–37)
BUN: 67 mg/dL — ABNORMAL HIGH (ref 6–23)
CO2: 22 mEq/L (ref 19–32)
Calcium: 9.3 mg/dL (ref 8.4–10.5)
Chloride: 103 mEq/L (ref 96–112)
Creatinine, Ser: 3.82 mg/dL — ABNORMAL HIGH (ref 0.50–1.35)
GFR calc Af Amer: 20 mL/min — ABNORMAL LOW (ref >90)
GFR calc non Af Amer: 18 mL/min — ABNORMAL LOW (ref >90)
Glucose, Bld: 79 mg/dL (ref 70–99)
Total Protein: 8.1 g/dL (ref 6.0–8.3)

## 2012-11-29 LAB — SALICYLATE LEVEL: Salicylate Lvl: 0.1 mg/dL — ABNORMAL LOW (ref 2.8–20.0)

## 2012-11-29 LAB — ACETAMINOPHEN LEVEL: Acetaminophen (Tylenol), Serum: <15 ug/mL (ref 10–30)

## 2012-11-29 LAB — ETHANOL: Alcohol, Ethyl (B): <11 mg/dL (ref 0–11)

## 2012-11-29 NOTE — ED Provider Notes (Signed)
CSN: [id number]     Arrival date & time 11/29/12  1651 History   First MD Initiated Contact with Patient 11/29/12 1712    Scribed for Jack Clonts, MD, the patient was seen in room APA16A/APA16A. This chart was scribed by Denice Bors, ED scribe. Patient's care was started at 5:40 PM  Chief Complaint  Patient presents with  . V70.1   (Consider location/radiation/quality/duration/timing/severity/associated sxs/prior Treatment) The history is provided by the patient. No language interpreter was used.   HPI Comments: Jack Irwin is a 47 y.o. male who presents to the Emergency Department complaining of intermittent worsening suicidal ideations onset 1 week. Reports precipitating factors include arguments with mother, kids, and finances. Reports thinking "cutting" himself. Reports having suicidal ideations in the past. Denies associated homicidal ideations. Denies ingesting any pills today.  Additionally, reports heart racing and gout pain in his feet. Denies associated fever, chills, shortness of breath, abdominal pain, diarrhea, dysuria, and chest pain.    Past Medical History  Diagnosis Date  . Hypertension   . Diabetes mellitus   . Gout   . Chronic back pain   . BPH (benign prostatic hyperplasia)   . Anemia   . DVT (deep venous thrombosis)   . Neuropathy   . Decubitus ulcer     of 2nd toes of both feet.  . Edema leg   . Constipation   . Venous (peripheral) insufficiency   . Neuropathy, diabetic   . Physical deconditioning   . Poor balance   . Difficulty walking   . Lack of coordination   . Renal insufficiency   . Chronic kidney disease   . Chronic kidney disease (CKD), stage IV (severe)   . Arthritis    Past Surgical History  Procedure Laterality Date  . None     Family History  Problem Relation Age of Onset  . Diabetes Mother   . Hypertension Mother   . Heart failure Mother   . Hyperlipidemia Mother   . Cancer Father   . Diabetes Father   .  Hypertension Father   . Hyperlipidemia Father    History  Substance Use Topics  . Smoking status: Never Smoker   . Smokeless tobacco: Never Used  . Alcohol Use: No    Review of Systems  Constitutional: Negative for fever and chills.  Eyes: Negative for visual disturbance.  Respiratory: Negative for shortness of breath.   Cardiovascular: Negative for chest pain.  Gastrointestinal: Negative for vomiting and abdominal pain.  Genitourinary: Negative for dysuria and flank pain.  Musculoskeletal: Negative for back pain, neck pain and neck stiffness.  Skin: Negative for rash.  Neurological: Negative for light-headedness and headaches.  Psychiatric/Behavioral: Positive for suicidal ideas.  All other systems reviewed and are negative.   A complete 10 system review of systems was obtained and all systems are negative except as noted in the HPI and PMHx.    Allergies  Review of patient's allergies indicates no known allergies.  Home Medications   Current Outpatient Rx  Name  Route  Sig  Dispense  Refill  . amitriptyline (ELAVIL) 25 MG tablet               . calcium acetate (PHOSLO) 667 MG capsule   Oral   Take 1,334 mg by mouth 3 (three) times daily with meals. BINDER         . colchicine 0.6 MG tablet   Oral   Take 0.6 mg by mouth 2 (two) times daily  as needed. For GOUT PAIN         . Febuxostat (ULORIC) 80 MG TABS   Oral   Take 1 tablet by mouth daily as needed. For GOUT/HIGH URIC ACID         . HYDROcodone-acetaminophen (NORCO/VICODIN) 5-325 MG per tablet   Oral   Take 1 tablet by mouth every 6 (six) hours as needed for pain.   10 tablet   0   . methocarbamol (ROBAXIN) 500 MG tablet   Oral   Take 2 tablets (1,000 mg total) by mouth 4 (four) times daily as needed (muscle spasm/pain).   25 tablet   0   . metoCLOPramide (REGLAN) 10 MG tablet   Oral   Take 1 tablet (10 mg total) by mouth 3 (three) times daily with meals as needed.   30 tablet   0   .  metoprolol (LOPRESSOR) 50 MG tablet   Oral   Take 1 tablet (50 mg total) by mouth daily.   7 tablet   0   . ondansetron (ZOFRAN ODT) 8 MG disintegrating tablet   Oral   Take 1 tablet (8 mg total) by mouth every 8 (eight) hours as needed for nausea.   10 tablet   0   . promethazine (PHENERGAN) 25 MG tablet   Oral   Take 1 tablet (25 mg total) by mouth every 6 (six) hours as needed for nausea.   15 tablet   0   . sildenafil (VIAGRA) 100 MG tablet   Oral   Take 100 mg by mouth daily as needed for erectile dysfunction.          BP 154/91  Pulse 59  Temp(Src) 98.1 F (36.7 C) (Oral)  Resp 20  Ht 5\' 11"  (1.803 m)  Wt 274 lb (124.286 kg)  BMI 38.23 kg/m2  SpO2 100% Physical Exam  Nursing note and vitals reviewed. Constitutional: He is oriented to person, place, and time. He appears well-developed and well-nourished.  HENT:  Head: Normocephalic and atraumatic.  Eyes: Conjunctivae and EOM are normal. Right eye exhibits no discharge. Left eye exhibits no discharge.  Fields of vision intact with fingers   Neck: Normal range of motion. Neck supple. No tracheal deviation present.  Cardiovascular: Normal rate and regular rhythm.   Pulmonary/Chest: Effort normal and breath sounds normal.  Abdominal: Soft. He exhibits no distension. There is tenderness. There is no rebound and no guarding.  Mild TTP of epigastrium   Musculoskeletal: He exhibits no edema.  Bilateral feet: Dried disfigured nails, no open ulcers on the plantar surfaces of feet, and very dry feet.   Neurological: He is alert and oriented to person, place, and time. No cranial nerve deficit.  CN II-XII grossly intact  Skin: Skin is warm. No rash noted.  Psychiatric: He has a normal mood and affect. He expresses suicidal ideation. He expresses no homicidal ideation. He expresses suicidal plans.  Flat affect, appropriate and smiles during exam    ED Course  Procedures (including critical care time) COORDINATION OF  CARE:  Nursing notes reviewed. Vital signs reviewed. Initial pt interview and examination performed.   5:47 PM-Discussed work up plan with pt at bedside, which includes CBC with diff panel, CMP, acetaminophen level, salicylate level and ethanol . Pt agrees with plan.  8:53 PM Behavioral health assessment team state can follow up outpatient for symptoms. Pt is agreeable to follow up outpatient. Pt denies SI and HI at this time.   Treatment plan initiated:Medications -  No data to display   Initial diagnostic testing ordered.    Labs Review Labs Reviewed  CBC WITH DIFFERENTIAL - Abnormal; Notable for the following:    RDW 16.6 (*)    All other components within normal limits  COMPREHENSIVE METABOLIC PANEL - Abnormal; Notable for the following:    BUN 67 (*)    Creatinine, Ser 3.82 (*)    GFR calc non Af Amer 18 (*)    GFR calc Af Amer 20 (*)    All other components within normal limits  SALICYLATE LEVEL - Abnormal; Notable for the following:    Salicylate Lvl 0.1 (*)    All other components within normal limits  ACETAMINOPHEN LEVEL  ETHANOL   Imaging Review No results found.  EKG Interpretation     Ventricular Rate:  69 PR Interval:  136 QRS Duration: 86 QT Interval:  408 QTC Calculation: 437 R Axis:   3 Text Interpretation:  Normal sinus rhythm Normal ECG When compared with ECG of 02-Nov-2012 17:11, No significant change was found poor baseline            MDM  No diagnosis found. I personally performed the services described in this documentation, which was scribed in my presence. The recorded information has been reviewed and is accurate.  Clinically low risk suicide. Pt denied CP during my evaluation. ekg no acute findings, reviewed.  BH arranged close outpt fup. PT denies SI or HI or CP on reexam. Pt comfortable with close outpt fup with pcp.   Results and differential diagnosis were discussed with the patient. Close follow up outpatient was discussed,  patient comfortable with the plan.   Diagnosis: Suicidal ideation  Jack Clonts, MD 12/02/12 2240

## 2012-11-29 NOTE — BH Assessment (Signed)
TeleAssessment Note  Jack Irwin is an 47 y.o. male who presents to [facility] for evaluation of back pain and passive suicidal ideaiton. Jack Irwin reports that he has been in pain for a long time and is unable to work.  He is on disability, but has difficulty covering all of his expenses and feels guilty that he's unable to provide for his family.  He states that his step kids (32 and 62) don't like him and now it feels like his own 18 year old daughter doesn't like him either.  He hasn't been getting along with his wife and is generally sad and depressed.  He states that suicide has crossed his mind, but he has never thought of a plan and has no intention of doing so.  He currently denies any suicidal ideation and states, I go to church, I'm not going to do that.  He reports feelings of worthlessness, isolating behavior, losing interest in things he would normally enjoy, and feelings of guilt.  He denies crying spells except a few weeks ago when he learned his wife was texting someone else, but states he tried talking to another woman and that wasn't helpful.  He denies substance abuse, AVH, or homicidal ideation-now or in the last six months.  He expressed some interest in intensive outpatient, but finances are a concern so he is worried about gas and having to ask his wife to bring him.  He is willing to follow up with a psychiatrist and this writer ensured that he understood that he would need to do so himself, but he also gave permission for me to leave referral information with Jack Irwin outpatient offices in [place], which I did.  This Probation officer consulted with Jack Irwin who is in agreement with the disposition.  Pt will sign no harm contract, referrals faxed for him to follow up.  Pt to be discharged home with spouse.  This Probation officer also explained mobile crisis and suicide prevention information.  There are no weapons in the home.    Jack Irwin  Jack Irwin [address]   [address]   [id number] [place], [place] [address]   [place], [place] [address]   [place] [address] [phone]    [phone]    [phone]  [place]  [place] [facility] [address]    [address]   [place], [place] [address]  [place], [place] [address]   [place], [place] [address] [phone]    [phone]    [phone]   Axis I: Depressive Disorder NOS and Depressive Disorder secondary to general medical condition Axis II: Deferred Axis III:  Past Medical History  Diagnosis Date  . Hypertension   . Diabetes mellitus   . Gout   . Chronic back pain   . BPH (benign prostatic hyperplasia)   . Anemia   . DVT (deep venous thrombosis)   . Neuropathy   . Decubitus ulcer     of 2nd toes of both feet.  . Edema leg   . Constipation   . Venous (peripheral) insufficiency   . Neuropathy, diabetic   . Physical deconditioning   . Poor balance   . Difficulty walking   . Lack of coordination   . Renal insufficiency   . Chronic kidney disease   . Chronic kidney disease (CKD), stage IV (severe)   . Arthritis  Axis IV: economic problems, occupational problems, problems with access to health care services and problems with primary support group Axis V: 51-60 moderate symptoms  Past Medical History:  Past Medical History  Diagnosis Date  . Hypertension   . Diabetes mellitus   . Gout   . Chronic back pain   . BPH (benign prostatic hyperplasia)   . Anemia   . DVT (deep venous thrombosis)   . Neuropathy   . Decubitus ulcer     of 2nd toes of both feet.  . Edema leg   . Constipation   . Venous (peripheral) insufficiency   . Neuropathy, diabetic   . Physical deconditioning   . Poor balance   . Difficulty walking   . Lack of coordination   . Renal insufficiency   . Chronic kidney disease   . Chronic kidney disease (CKD), stage IV (severe)   . Arthritis     Past Surgical History  Procedure  Laterality Date  . None      Family History:  Family History  Problem Relation Age of Onset  . Diabetes Mother   . Hypertension Mother   . Heart failure Mother   . Hyperlipidemia Mother   . Cancer Father   . Diabetes Father   . Hypertension Father   . Hyperlipidemia Father     Social History:  reports that he has never smoked. He has never used smokeless tobacco. He reports that he does not drink alcohol or use illicit drugs.  Additional Social History:  Alcohol / Drug Use History of alcohol / drug use?: No history of alcohol / drug abuse  CIWA: CIWA-Ar BP: 154/91 mmHg Pulse Rate: 59 COWS:    Allergies: No Known Allergies  Home Medications:  (Not in a Irwin admission)  OB/GYN Status:  No LMP for male patient.  General Assessment Data Location of Assessment: [facility] Is this a Tele or Face-to-Face Assessment?: Tele Assessment Is this an Initial Assessment or a Re-assessment for this encounter?: Initial Assessment Living Arrangements: Spouse/significant other;Children (wife, step children 11, 73, 6 yo daughter) Can pt return to current living arrangement?: Yes Admission Status: Voluntary Is patient capable of signing voluntary admission?: Yes Transfer from: [place] [facility] Referral Source: Self/Family/Friend     [place] Living Arrangements: Spouse/significant other;Children (wife, step children 45, 16, 15 yo daughter)  Education Status Is patient currently in school?: No  Risk to self Suicidal Ideation: No-Not Currently/Within Last 6 Months Suicidal Intent: No Is patient at risk for suicide?: No Suicidal Plan?: No Access to Means: No What has been your use of drugs/alcohol within the last 12 months?: denies Previous Attempts/Gestures: No How many times?: 0 Intentional Self Injurious Behavior: None Family Suicide History: No Recent stressful life event(s): Financial Problems;Turmoil (Comment) (kids don't like him, money problems) Persecutory  voices/beliefs?: No Depression: Yes Depression Symptoms: Feeling worthless/self pity;Loss of interest in usual pleasures;Guilt;Isolating;Tearfulness;Despondent Substance abuse history and/or treatment for substance abuse?: No Suicide prevention information given to non-admitted patients: Not applicable  Risk to Others Homicidal Ideation: No Thoughts of Harm to Others: No Current Homicidal Intent: No Current Homicidal Plan: No Access to Homicidal Means: No History of harm to others?: No Assessment of Violence: None Noted Does patient have access to weapons?: No Criminal Charges Pending?: No Does patient have a court date: No  Psychosis Hallucinations: None noted Delusions: None noted  Mental Status Report Appear/Hygiene: Other (Comment) (unremarkable) Eye Contact: Fair Motor Activity: Freedom of movement Speech: Logical/coherent Level of Consciousness:  Alert Mood: Depressed Affect: Appropriate to circumstance Anxiety Level: None Thought Processes: Relevant;Coherent Judgement: Unimpaired Orientation: Time;Situation;Person;Place Obsessive Compulsive Thoughts/Behaviors: Minimal  Cognitive Functioning Concentration: Normal Memory: Recent Intact;Remote Intact IQ: Average Insight: Good Impulse Control: Good Appetite: Good Weight Loss: 0 Weight Gain: 0 Sleep: No Change Total Hours of Sleep: 8 Vegetative Symptoms: None  ADLScreening [facility] Assessment [facility]) Patient's cognitive ability adequate to safely complete daily activities?: Yes Patient able to express need for assistance with ADLs?: Yes Independently performs ADLs?: Yes (appropriate for developmental age)  Prior Inpatient Therapy Prior Inpatient Therapy: No  Prior Outpatient Therapy Prior Outpatient Therapy: No  ADL Screening (condition at time of admission) Patient's cognitive ability adequate to safely complete daily activities?: Yes Patient able to express need for assistance with ADLs?:  Yes Independently performs ADLs?: Yes (appropriate for developmental age)       Abuse/Neglect Assessment (Assessment to be complete while patient is alone) Physical Abuse: Denies Verbal Abuse: Denies Sexual Abuse: Denies Exploitation of patient/patient's resources: Denies Self-Neglect: Denies Values / Beliefs Cultural Requests During Hospitalization: None Spiritual Requests During Hospitalization: None   Advance Directives (For Healthcare) Advance Directive: Patient does not have advance directive;Patient would not like information Pre-existing out of facility DNR order (yellow form or pink MOST form): No Nutrition Screen- [facility] Adult/[facility] Patient's home diet: Regular  Additional Information 1:1 In Past 12 Months?: No CIRT Risk: No Elopement Risk: No Does patient have medical clearance?: Yes     Disposition:  Disposition Initial Assessment Completed for this Encounter: Yes Disposition of Patient: Outpatient treatment Type of outpatient treatment: Adult  On Site Evaluation by:  Idelia Salm Reviewed with Physician:  Idelia Salm  Darlys Gales 11/29/2012 9:36 PM

## 2012-11-29 NOTE — ED Notes (Signed)
Pt signed no harm contract and copy given to pt.  Pt denies that he was ever suicidal, states "just trying to get some attention"

## 2012-11-29 NOTE — BH Assessment (Signed)
[facility] Assessment Progress Note      Spoke with EDP, Zavitz,  to consult about patient and reason for teleassessment. Spoke with EDRN Ivin Booty who will set up teleassessment for 2026

## 2012-11-29 NOTE — ED Notes (Signed)
Called forTTS consult.  They will call back.

## 2012-11-29 NOTE — ED Notes (Signed)
Pt says he is suicidal, tired of living.  "I need to be committed for a couple of days to get my thoughts together"  Says he is dizzy and having chest pain also

## 2012-12-14 ENCOUNTER — Ambulatory Visit (INDEPENDENT_AMBULATORY_CARE_PROVIDER_SITE_OTHER): Payer: Medicare Other | Admitting: Psychiatry

## 2012-12-14 DIAGNOSIS — F329 Major depressive disorder, single episode, unspecified: Secondary | ICD-10-CM

## 2012-12-14 DIAGNOSIS — F3289 Other specified depressive episodes: Secondary | ICD-10-CM

## 2012-12-14 DIAGNOSIS — F419 Anxiety disorder, unspecified: Secondary | ICD-10-CM

## 2012-12-14 DIAGNOSIS — F32A Depression, unspecified: Secondary | ICD-10-CM

## 2012-12-14 DIAGNOSIS — F411 Generalized anxiety disorder: Secondary | ICD-10-CM

## 2012-12-14 NOTE — Patient Instructions (Signed)
Discussed orally 
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
## 2012-12-17 ENCOUNTER — Encounter (HOSPITAL_COMMUNITY): Payer: Self-pay | Admitting: Emergency Medicine

## 2012-12-17 ENCOUNTER — Emergency Department (HOSPITAL_COMMUNITY)
Admission: EM | Admit: 2012-12-17 | Discharge: 2012-12-17 | Disposition: A | Discharge status: Home / Self Care | Payer: Medicare Other | Attending: Emergency Medicine | Admitting: Emergency Medicine

## 2012-12-17 DIAGNOSIS — Z862 Personal history of diseases of the blood and blood-forming organs and certain disorders involving the immune mechanism: Secondary | ICD-10-CM | POA: Insufficient documentation

## 2012-12-17 DIAGNOSIS — L02219 Cutaneous abscess of trunk, unspecified: Secondary | ICD-10-CM | POA: Insufficient documentation

## 2012-12-17 DIAGNOSIS — IMO0002 Reserved for concepts with insufficient information to code with codable children: Secondary | ICD-10-CM

## 2012-12-17 DIAGNOSIS — K5900 Constipation, unspecified: Secondary | ICD-10-CM | POA: Insufficient documentation

## 2012-12-17 DIAGNOSIS — Z86718 Personal history of other venous thrombosis and embolism: Secondary | ICD-10-CM | POA: Insufficient documentation

## 2012-12-17 DIAGNOSIS — N184 Chronic kidney disease, stage 4 (severe): Secondary | ICD-10-CM | POA: Insufficient documentation

## 2012-12-17 DIAGNOSIS — E1149 Type 2 diabetes mellitus with other diabetic neurological complication: Secondary | ICD-10-CM | POA: Insufficient documentation

## 2012-12-17 DIAGNOSIS — E669 Obesity, unspecified: Secondary | ICD-10-CM | POA: Insufficient documentation

## 2012-12-17 DIAGNOSIS — Z79899 Other long term (current) drug therapy: Secondary | ICD-10-CM | POA: Insufficient documentation

## 2012-12-17 DIAGNOSIS — E1142 Type 2 diabetes mellitus with diabetic polyneuropathy: Secondary | ICD-10-CM | POA: Insufficient documentation

## 2012-12-17 DIAGNOSIS — E119 Type 2 diabetes mellitus without complications: Secondary | ICD-10-CM | POA: Insufficient documentation

## 2012-12-17 DIAGNOSIS — I120 Hypertensive chronic kidney disease with stage 5 chronic kidney disease or end stage renal disease: Secondary | ICD-10-CM | POA: Insufficient documentation

## 2012-12-17 DIAGNOSIS — G8929 Other chronic pain: Secondary | ICD-10-CM | POA: Insufficient documentation

## 2012-12-17 DIAGNOSIS — M109 Gout, unspecified: Secondary | ICD-10-CM | POA: Insufficient documentation

## 2012-12-17 MED ORDER — SULFAMETHOXAZOLE-TRIMETHOPRIM 800-160 MG PO TABS: 1.0000 | ORAL_TABLET | Freq: Two times a day (BID) | ORAL | Status: DC

## 2012-12-17 MED ORDER — SULFAMETHOXAZOLE-TMP DS 800-160 MG PO TABS
1.0000 | ORAL_TABLET | Freq: Once | ORAL | Status: AC
  Administered 2012-12-17: 1 via ORAL
  Filled 2012-12-17: qty 1

## 2012-12-17 MED ORDER — OXYCODONE-ACETAMINOPHEN 5-325 MG PO TABS
2.0000 | ORAL_TABLET | Freq: Once | ORAL | Status: AC
  Administered 2012-12-17: 2 via ORAL
  Filled 2012-12-17: qty 2

## 2012-12-17 MED ORDER — HYDROCODONE-ACETAMINOPHEN 5-325 MG PO TABS: 2.0000 | ORAL_TABLET | ORAL | Status: DC | PRN

## 2012-12-17 NOTE — ED Provider Notes (Signed)
CSN: [id number]     Arrival date & time 12/17/12  1512 History  This chart was scribed for Nat Christen, MD by Rolanda Lundborg, ED Scribe. This patient was seen in room APA18/APA18 and the patient's care was started at 5:38 PM.   Chief Complaint  Patient presents with  . Abscess   The history is provided by the patient. No language interpreter was used.   HPI Comments: HOWARD BRUER is a 47 y.o. male who presents to the Emergency Department complaining of abscess to LLQ that started 2 days ago with associated pain and redness. He denies drainage from the area. Severity is mild to moderate. Palpation makes pain worse. No radiation   Past Medical History  Diagnosis Date  . Hypertension   . Diabetes mellitus   . Gout   . Chronic back pain   . BPH (benign prostatic hyperplasia)   . Anemia   . DVT (deep venous thrombosis)   . Neuropathy   . Decubitus ulcer     of 2nd toes of both feet.  . Edema leg   . Constipation   . Venous (peripheral) insufficiency   . Neuropathy, diabetic   . Physical deconditioning   . Poor balance   . Difficulty walking   . Lack of coordination   . Arthritis   . Renal insufficiency   . Chronic kidney disease   . Chronic kidney disease (CKD), stage IV (severe)    Past Surgical History  Procedure Laterality Date  . None     Family History  Problem Relation Age of Onset  . Diabetes Mother   . Hypertension Mother   . Heart failure Mother   . Hyperlipidemia Mother   . Cancer Father   . Diabetes Father   . Hypertension Father   . Hyperlipidemia Father    History  Substance Use Topics  . Smoking status: Never Smoker   . Smokeless tobacco: Never Used  . Alcohol Use: No    Review of Systems  Skin: Positive for wound.   A complete 10 system review of systems was obtained and all systems are negative except as noted in the HPI and PMH.   Allergies  Review of patient's allergies indicates no known allergies.  Home Medications   Current  Outpatient Rx  Name  Route  Sig  Dispense  Refill  . amitriptyline (ELAVIL) 25 MG tablet               . calcium acetate (PHOSLO) 667 MG capsule   Oral   Take 1,334 mg by mouth 3 (three) times daily with meals. BINDER         . colchicine 0.6 MG tablet   Oral   Take 0.6 mg by mouth 2 (two) times daily as needed. For GOUT PAIN         . Febuxostat (ULORIC) 80 MG TABS   Oral   Take 1 tablet by mouth daily as needed. For GOUT/HIGH URIC ACID         . HYDROcodone-acetaminophen (NORCO/VICODIN) 5-325 MG per tablet   Oral   Take 1 tablet by mouth every 6 (six) hours as needed for pain.   10 tablet   0   . methocarbamol (ROBAXIN) 500 MG tablet   Oral   Take 2 tablets (1,000 mg total) by mouth 4 (four) times daily as needed (muscle spasm/pain).   25 tablet   0   . metoCLOPramide (REGLAN) 10 MG tablet   Oral  Take 1 tablet (10 mg total) by mouth 3 (three) times daily with meals as needed.   30 tablet   0   . metoprolol (LOPRESSOR) 50 MG tablet   Oral   Take 1 tablet (50 mg total) by mouth daily.   7 tablet   0   . ondansetron (ZOFRAN ODT) 8 MG disintegrating tablet   Oral   Take 1 tablet (8 mg total) by mouth every 8 (eight) hours as needed for nausea.   10 tablet   0   . promethazine (PHENERGAN) 25 MG tablet   Oral   Take 1 tablet (25 mg total) by mouth every 6 (six) hours as needed for nausea.   15 tablet   0   . sildenafil (VIAGRA) 100 MG tablet   Oral   Take 100 mg by mouth daily as needed for erectile dysfunction.          BP 160/103  Pulse 68  Temp(Src) 98.2 F (36.8 C) (Oral)  Resp 20  Ht 5\' 11"  (1.803 m)  Wt 272 lb (123.378 kg)  BMI 37.95 kg/m2  SpO2 100% Physical Exam  Nursing note and vitals reviewed. Constitutional: He is oriented to person, place, and time. He appears well-developed and well-nourished.  obese  HENT:  Head: Normocephalic and atraumatic.  Eyes: Conjunctivae and EOM are normal. Pupils are equal, round, and reactive  to light.  Neck: Normal range of motion. Neck supple.  Cardiovascular: Normal rate, regular rhythm and normal heart sounds.   Pulmonary/Chest: Effort normal and breath sounds normal.  Abdominal: Soft. Bowel sounds are normal.  Musculoskeletal: Normal range of motion.  Neurological: He is alert and oriented to person, place, and time.  Skin: Skin is warm and dry.  LLQ area of induration approximately 3cm in diameter, slight erythema and induration.  Psychiatric: He has a normal mood and affect.    ED Course  Procedures (including critical care time) Medications - No data to display  DIAGNOSTIC STUDIES: Oxygen Saturation is 100% on RA, normal by my interpretation.    COORDINATION OF CARE: 5:48 PM- Discussed treatment plan with pt which includes antibiotics and advising the pt to use warm compresses. Pt agrees to plan.    Labs Review Labs Reviewed - No data to display Imaging Review No results found.  EKG Interpretation   None       MDM  No diagnosis found. Abscess is indurated. No obvious pus pocket. We'll start Septra DS.  Patient understands  infection may worsen.   Also Rx for Norco    I personally performed the services described in this documentation, which was scribed in my presence. The recorded information has been reviewed and is accurate.    Nat Christen, MD 12/17/12 [phone]

## 2012-12-17 NOTE — ED Notes (Signed)
Abscess to LLQ

## 2012-12-20 ENCOUNTER — Ambulatory Visit (INDEPENDENT_AMBULATORY_CARE_PROVIDER_SITE_OTHER): Payer: Medicare Other | Admitting: Psychiatry

## 2012-12-20 ENCOUNTER — Emergency Department (HOSPITAL_COMMUNITY)
Admission: EM | Admit: 2012-12-20 | Discharge: 2012-12-20 | Disposition: A | Discharge status: Home / Self Care | Payer: Medicare Other | Attending: Emergency Medicine | Admitting: Emergency Medicine

## 2012-12-20 ENCOUNTER — Encounter (HOSPITAL_COMMUNITY): Payer: Self-pay | Admitting: Emergency Medicine

## 2012-12-20 DIAGNOSIS — E1142 Type 2 diabetes mellitus with diabetic polyneuropathy: Secondary | ICD-10-CM | POA: Insufficient documentation

## 2012-12-20 DIAGNOSIS — F419 Anxiety disorder, unspecified: Secondary | ICD-10-CM

## 2012-12-20 DIAGNOSIS — Z79899 Other long term (current) drug therapy: Secondary | ICD-10-CM | POA: Insufficient documentation

## 2012-12-20 DIAGNOSIS — Z872 Personal history of diseases of the skin and subcutaneous tissue: Secondary | ICD-10-CM | POA: Insufficient documentation

## 2012-12-20 DIAGNOSIS — G8929 Other chronic pain: Secondary | ICD-10-CM | POA: Insufficient documentation

## 2012-12-20 DIAGNOSIS — Z862 Personal history of diseases of the blood and blood-forming organs and certain disorders involving the immune mechanism: Secondary | ICD-10-CM | POA: Insufficient documentation

## 2012-12-20 DIAGNOSIS — F32A Depression, unspecified: Secondary | ICD-10-CM

## 2012-12-20 DIAGNOSIS — Z86718 Personal history of other venous thrombosis and embolism: Secondary | ICD-10-CM | POA: Insufficient documentation

## 2012-12-20 DIAGNOSIS — F329 Major depressive disorder, single episode, unspecified: Secondary | ICD-10-CM

## 2012-12-20 DIAGNOSIS — Z8719 Personal history of other diseases of the digestive system: Secondary | ICD-10-CM | POA: Insufficient documentation

## 2012-12-20 DIAGNOSIS — L02211 Cutaneous abscess of abdominal wall: Secondary | ICD-10-CM

## 2012-12-20 DIAGNOSIS — M109 Gout, unspecified: Secondary | ICD-10-CM | POA: Insufficient documentation

## 2012-12-20 DIAGNOSIS — N184 Chronic kidney disease, stage 4 (severe): Secondary | ICD-10-CM | POA: Insufficient documentation

## 2012-12-20 DIAGNOSIS — E1149 Type 2 diabetes mellitus with other diabetic neurological complication: Secondary | ICD-10-CM | POA: Insufficient documentation

## 2012-12-20 DIAGNOSIS — I129 Hypertensive chronic kidney disease with stage 1 through stage 4 chronic kidney disease, or unspecified chronic kidney disease: Secondary | ICD-10-CM | POA: Insufficient documentation

## 2012-12-20 DIAGNOSIS — F3289 Other specified depressive episodes: Secondary | ICD-10-CM

## 2012-12-20 DIAGNOSIS — F411 Generalized anxiety disorder: Secondary | ICD-10-CM

## 2012-12-20 DIAGNOSIS — M129 Arthropathy, unspecified: Secondary | ICD-10-CM | POA: Insufficient documentation

## 2012-12-20 DIAGNOSIS — L02219 Cutaneous abscess of trunk, unspecified: Secondary | ICD-10-CM | POA: Insufficient documentation

## 2012-12-20 LAB — GLUCOSE, CAPILLARY
Glucose-Capillary: 67 mg/dL — ABNORMAL LOW (ref 70–99)
Glucose-Capillary: 134 mg/dL — ABNORMAL HIGH (ref 70–99)

## 2012-12-20 MED ORDER — SULFAMETHOXAZOLE-TMP DS 800-160 MG PO TABS
1.0000 | ORAL_TABLET | Freq: Once | ORAL | Status: AC
  Administered 2012-12-20: 1 via ORAL
  Filled 2012-12-20: qty 1

## 2012-12-20 MED ORDER — SULFAMETHOXAZOLE-TRIMETHOPRIM 800-160 MG PO TABS: 1.0000 | ORAL_TABLET | Freq: Two times a day (BID) | ORAL | Status: DC

## 2012-12-20 MED ORDER — LIDOCAINE-EPINEPHRINE (PF) 1 %-1:200000 IJ SOLN
INTRAMUSCULAR | Status: AC
  Administered 2012-12-20: 15:00:00
  Filled 2012-12-20: qty 10

## 2012-12-20 MED ORDER — OXYCODONE-ACETAMINOPHEN 5-325 MG PO TABS
1.0000 | ORAL_TABLET | Freq: Once | ORAL | Status: AC
  Administered 2012-12-20: 1 via ORAL
  Filled 2012-12-20: qty 1

## 2012-12-20 MED ORDER — POVIDONE-IODINE 10 % EX SOLN
CUTANEOUS | Status: AC
  Administered 2012-12-20: 15:00:00
  Filled 2012-12-20: qty 118

## 2012-12-20 NOTE — ED Notes (Signed)
Pt states abscess to left lower abdomen. States he was seen here on Friday for the same but has been unable to afford to have antibiotics filled. States abscess and pain to area has gotten worse.

## 2012-12-20 NOTE — ED Provider Notes (Signed)
CSN: [id number]     Arrival date & time 12/20/12  1057 History   This chart was scribed for Sharyon Cable, MD by Jenne Campus, ED Scribe. This patient was seen in room APA12/APA12 and the patient's care was started at 12:27 PM.    Chief Complaint  Patient presents with  . Abscess    Patient is a 47 y.o. male presenting with abscess. The history is provided by the patient. No language interpreter was used.  Abscess Location:  Torso Torso abscess location:  Abd LLQ Abscess quality: painful and redness   Red streaking: no   Duration:  5 days Progression:  Worsening Chronicity:  New Context: diabetes   Relieved by:  Nothing Worsened by:  Nothing tried Ineffective treatments:  None tried Associated symptoms: no fever and no nausea     HPI Comments: Jack Irwin is a 47 y.o. male with a h/o DM who presents to the Emergency Department complaining of an abscess to the LLQ that has been worsening since it's onset 5 days ago. He was seen 3 days ago and was told that the abscess was not ready for an I&D. He was given a prescription for an antibiotic but stated that he couldn't afford it. He denies any associated fevers or nausea.   Past Medical History  Diagnosis Date  . Hypertension   . Diabetes mellitus   . Gout   . Chronic back pain   . BPH (benign prostatic hyperplasia)   . Anemia   . DVT (deep venous thrombosis)   . Neuropathy   . Decubitus ulcer     of 2nd toes of both feet.  . Edema leg   . Constipation   . Venous (peripheral) insufficiency   . Neuropathy, diabetic   . Physical deconditioning   . Poor balance   . Difficulty walking   . Lack of coordination   . Arthritis   . Renal insufficiency   . Chronic kidney disease   . Chronic kidney disease (CKD), stage IV (severe)    Past Surgical History  Procedure Laterality Date  . None     Family History  Problem Relation Age of Onset  . Diabetes Mother   . Hypertension Mother   . Heart failure Mother    . Hyperlipidemia Mother   . Cancer Father   . Diabetes Father   . Hypertension Father   . Hyperlipidemia Father    History  Substance Use Topics  . Smoking status: Never Smoker   . Smokeless tobacco: Never Used  . Alcohol Use: No    Review of Systems  Constitutional: Negative for fever.  Gastrointestinal: Negative for nausea.  Skin: Positive for wound (abscess).  All other systems reviewed and are negative.    Allergies  Review of patient's allergies indicates no known allergies.  Home Medications   Current Outpatient Rx  Name  Route  Sig  Dispense  Refill  . colchicine 0.6 MG tablet   Oral   Take 0.6 mg by mouth 2 (two) times daily as needed. For GOUT PAIN         . Febuxostat (ULORIC) 80 MG TABS   Oral   Take 1 tablet by mouth daily as needed. For GOUT/HIGH URIC ACID         . HYDROcodone-acetaminophen (NORCO/VICODIN) 5-325 MG per tablet   Oral   Take 1-2 tablets by mouth every 4 (four) hours as needed for moderate pain.         [name]  metoprolol (LOPRESSOR) 50 MG tablet   Oral   Take 1 tablet (50 mg total) by mouth daily.   7 tablet   0    Triage Vitals: BP 134/94  Pulse 73  Temp(Src) 99.4 F (37.4 C) (Oral)  Ht 5\' 11"  (1.803 m)  Wt 272 lb (123.378 kg)  BMI 37.95 kg/m2  SpO2 100%  Physical Exam  Nursing note and vitals reviewed.  CONSTITUTIONAL: Well developed/well nourished HEAD: Normocephalic/atraumatic EYES: EOMI/ ENMT: Mucous membranes moist CV: S1/S2 noted, no murmurs/rubs/gallops noted LUNGS: Lungs are clear to auscultation bilaterally, no apparent distress ABDOMEN: soft, nontender, no rebound or guarding, soft tissue swelling to lower abdomen with mild erythema, no crepitance  NEURO: Pt is awake/alert, moves all extremitiesx4 EXTREMITIES: pulses normal, full ROM SKIN: warm, color normal PSYCH: no abnormalities of mood noted  ED Course  Procedures (including critical care time)  DIAGNOSTIC STUDIES: Oxygen Saturation is 100% on  room air, normal by my interpretation.    COORDINATION OF CARE: 12:30 PM-Discussed treatment plan which includes bedside US to determine if abscess is ready for I&D with pt at bedside and pt agreed to plan. Advised pt that he will have to fill his prescription in order for it to get better.  Small fluid collection seen by ultrasound He tolerated I&D well, bleeding controlled, no deep structures were injured Advised need to take bactrim and f/u with PCP  INCISION AND DRAINAGE Performed by: Sharyon Cable Consent: Verbal consent obtained. Risks and benefits: risks, benefits and alternatives were discussed Type: abscess  Body area: lower abdomen  Anesthesia: local infiltration  Incision was made with a scalpel.  Local anesthetic: lidocaine 1% with epinephrine  Anesthetic total: 5 ml  Complexity: complex Blunt dissection to break up loculations  Drainage: purulent  Drainage amount: small amount of fluid/pus  Packing material: no packing  Patient tolerance: Patient tolerated the procedure well with no immediate complications.  EMERGENCY DEPARTMENT US SOFT TISSUE INTERPRETATION "Study: Limited Ultrasound of the noted body part in comments below"  INDICATIONS: Soft tissue infection Multiple views of the body part are obtained with a multi-frequency linear probe  PERFORMED BY:  Myself  IMAGES ARCHIVED?: Yes  SIDE:Left  BODY PART:Abdominal wall  FINDINGS: Abcess present  LIMITATIONS:  Body Habitus  INTERPRETATION:  Abcess present        EKG Interpretation   None       MDM  No diagnosis found. Nursing notes including past medical history and social history reviewed and considered in documentation Labs/vital reviewed and considered   I personally performed the services described in this documentation, which was scribed in my presence. The recorded information has been reviewed and is accurate.      Sharyon Cable, MD 12/20/12 [phone]

## 2012-12-20 NOTE — Patient Instructions (Signed)
Discussed orally 

## 2012-12-20 NOTE — Progress Notes (Signed)
Patient:  Jack Irwin   DOB: Sep 21, 1965  MR Number: [id number]  Location: [place]:  [address] [place],  [place], [address]  Start: Monday 12/20/2012 9:05 AM End: Monday 12/20/2012 9:55 AM  Provider/Observer:     Maurice Small, MSW, LCSW   Chief Complaint:      Chief Complaint  Patient presents with  . Anxiety  . Depression    Reason For Service:     The patient was referred for services after being seen at [facility] emergency room do to suffering from stress and depression. He has been experiencing symptoms of depression and anxiety intermittently for the past 10 years. Patient reports experiencing passive suicidal ideations with no intent and no plan 2 to 3 weeks ago. He reports multiple stressors including financial issues as he has been disabled since 2011 due to multiple health issues including kidney disease and gout and is on a fixed income. He is worried about not being able to support his family and states not having enough food as well as being behind on bills. He reports additional stress related to his wife who he suspects is having an affair as he saw text messages to another man on wife's phone this summer. Wife admits talking to another man but denies having an affair. He says wife makes hurtful statements and recently stopped wearing her wedding ring. Wife complains about finances and communication in marriage but will not go to counseling with their pastor. Wife is a CNA but does not have a job. She has an interview today. He reports discord with his step children as well as his daughter who often back talk. Patient is seen for follow up appointment.  Interventions Strategy:  Supportive therapy  Participation Level:   Active  Participation Quality:  Appropriate      Behavioral Observation:  Casual, Alert, and Appropriate.   Current Psychosocial Factors: Marital discord, financial problems, parent-child discord  Content of  Session:   Establishing therapeutic alliance, reviewing symptoms, processing feelings, exploring patterns in patient's relationships, identifying areas within patient's control  Current Status:   Patient reports depressed mood, anxiety, sleep difficulty (4 hours of sleep per might), loss of interest in activities, irritability, and excessive worry.  Patient Progress:   Patient reports no change in symptoms since last session. He continues to express frustration regarding his marriage and have suspicions that wife is cheating. Patient shares that he's always had difficulty saying no and that wife has complained that he" lets people run over him". He admits this is probably true as he hates arguing. Therapist works with patient to discuss effects of past history on his current functioning and interaction. Therapist works with patient to process feelings and to begin to explore ways to improve assertiveness skills as well as take care of self the  Target Goals:   Establishing therapeutic alliance  Last Reviewed:     Goals Addressed Today:    Establishing therapeutic alliance  Impression/Diagnosis:   Patient presents with symptoms of anxiety and depression that have been intermittent for 10 years. Symptoms have worsened in recent weeks and patient reports having passive suicidal ideations 2 to 3 weeks ago. Other symptoms include depressed mood, anxiety, sleep difficulty (4 hours of sleep per might), loss of interest in activities, irritability, and excessive worry. He has multiple stressors including marital discord, financial issues, and discord with his children and states feeling overwhelmed. Patient also has multiple chronic health issues including kidney disease and diabetes. Diagnoses:  Depressive disorder NOS, anxiety disorder NOS      Diagnosis:  Axis I: Depressive disorder  Anxiety disorder          Axis II: Deferred

## 2012-12-20 NOTE — ED Notes (Signed)
Pt requesting voucher for $4 rx. Advised pt that he has used our services too much in past and unable to do that. Also advised him that we have talked with case manager and was advised could not do anything else for him due to already being on Medicare. Pt upset and wanting to write everybody up. Asked for president's name and Dorisann Frames info was given. Pt wheeled to waiting area phone to call his wife per pt

## 2012-12-27 ENCOUNTER — Ambulatory Visit (HOSPITAL_COMMUNITY): Payer: Medicare Other | Admitting: Psychiatry

## 2013-01-10 ENCOUNTER — Ambulatory Visit (HOSPITAL_COMMUNITY): Payer: Self-pay | Admitting: Psychiatry

## 2013-02-03 ENCOUNTER — Emergency Department (HOSPITAL_COMMUNITY)
Admission: EM | Admit: 2013-02-03 | Discharge: 2013-02-03 | Disposition: A | Discharge status: Home / Self Care | Payer: Medicare Other | Attending: Emergency Medicine | Admitting: Emergency Medicine

## 2013-02-03 ENCOUNTER — Emergency Department (HOSPITAL_COMMUNITY): Payer: Medicare Other

## 2013-02-03 ENCOUNTER — Encounter (HOSPITAL_COMMUNITY): Payer: Self-pay | Admitting: Emergency Medicine

## 2013-02-03 DIAGNOSIS — N184 Chronic kidney disease, stage 4 (severe): Secondary | ICD-10-CM | POA: Insufficient documentation

## 2013-02-03 DIAGNOSIS — E1142 Type 2 diabetes mellitus with diabetic polyneuropathy: Secondary | ICD-10-CM | POA: Insufficient documentation

## 2013-02-03 DIAGNOSIS — Z872 Personal history of diseases of the skin and subcutaneous tissue: Secondary | ICD-10-CM | POA: Insufficient documentation

## 2013-02-03 DIAGNOSIS — E1149 Type 2 diabetes mellitus with other diabetic neurological complication: Secondary | ICD-10-CM | POA: Insufficient documentation

## 2013-02-03 DIAGNOSIS — Z792 Long term (current) use of antibiotics: Secondary | ICD-10-CM | POA: Insufficient documentation

## 2013-02-03 DIAGNOSIS — G8929 Other chronic pain: Secondary | ICD-10-CM | POA: Insufficient documentation

## 2013-02-03 DIAGNOSIS — Z87448 Personal history of other diseases of urinary system: Secondary | ICD-10-CM | POA: Insufficient documentation

## 2013-02-03 DIAGNOSIS — Z862 Personal history of diseases of the blood and blood-forming organs and certain disorders involving the immune mechanism: Secondary | ICD-10-CM | POA: Insufficient documentation

## 2013-02-03 DIAGNOSIS — Z79899 Other long term (current) drug therapy: Secondary | ICD-10-CM | POA: Insufficient documentation

## 2013-02-03 DIAGNOSIS — M129 Arthropathy, unspecified: Secondary | ICD-10-CM | POA: Insufficient documentation

## 2013-02-03 DIAGNOSIS — R05 Cough: Secondary | ICD-10-CM

## 2013-02-03 DIAGNOSIS — I129 Hypertensive chronic kidney disease with stage 1 through stage 4 chronic kidney disease, or unspecified chronic kidney disease: Secondary | ICD-10-CM | POA: Insufficient documentation

## 2013-02-03 DIAGNOSIS — R059 Cough, unspecified: Secondary | ICD-10-CM | POA: Insufficient documentation

## 2013-02-03 DIAGNOSIS — Z86718 Personal history of other venous thrombosis and embolism: Secondary | ICD-10-CM | POA: Insufficient documentation

## 2013-02-03 DIAGNOSIS — M109 Gout, unspecified: Secondary | ICD-10-CM | POA: Insufficient documentation

## 2013-02-03 DIAGNOSIS — K5900 Constipation, unspecified: Secondary | ICD-10-CM | POA: Insufficient documentation

## 2013-02-03 MED ORDER — ACETAMINOPHEN 325 MG PO TABS
650.0000 mg | ORAL_TABLET | Freq: Once | ORAL | Status: AC
Start: 2013-02-03 — End: 2013-02-03
  Administered 2013-02-03: 650 mg via ORAL

## 2013-02-03 MED ORDER — ACETAMINOPHEN 325 MG PO TABS
ORAL_TABLET | ORAL | Status: AC
  Filled 2013-02-03: qty 2

## 2013-02-03 NOTE — Discharge Instructions (Signed)
If you were given medicines take as directed.  If you are on coumadin or contraceptives realize their levels and effectiveness is altered by many different medicines.  If you have any reaction (rash, tongues swelling, other) to the medicines stop taking and see a physician.   °Please follow up as directed and return to the ER or see a physician for new or worsening symptoms.  Thank you. ° ° °

## 2013-02-03 NOTE — ED Provider Notes (Signed)
CSN: [id number]     Arrival date & time 02/03/13  1850 History   First MD Initiated Contact with Patient 02/03/13 1918     Chief Complaint  Patient presents with  . Cough   (Consider location/radiation/quality/duration/timing/severity/associated sxs/prior Treatment) HPI Comments: 48 yo male with htn, dm, gout, anemia, dvt (when sick in hospital with renal failure) no current blood thinners presents with cough.  Pt has had a cough since this am, mild productive.  Possible sick contacts.  Mild chest ache only with coughing, no sob, no leg pain or swelling.  Patient denies active cancer, recent major trauma or surgery, unilateral leg swelling/ pain, recent long travel, or hemoptysis.   Patient is a 48 y.o. male presenting with cough. The history is provided by the patient.  Cough Associated symptoms: no chills, no fever, no headaches, no rash and no shortness of breath     Past Medical History  Diagnosis Date  . Hypertension   . Diabetes mellitus   . Gout   . Chronic back pain   . BPH (benign prostatic hyperplasia)   . Anemia   . DVT (deep venous thrombosis)   . Neuropathy   . Decubitus ulcer     of 2nd toes of both feet.  . Edema leg   . Constipation   . Venous (peripheral) insufficiency   . Neuropathy, diabetic   . Physical deconditioning   . Poor balance   . Difficulty walking   . Lack of coordination   . Arthritis   . Renal insufficiency   . Chronic kidney disease   . Chronic kidney disease (CKD), stage IV (severe)    Past Surgical History  Procedure Laterality Date  . None     Family History  Problem Relation Age of Onset  . Diabetes Mother   . Hypertension Mother   . Heart failure Mother   . Hyperlipidemia Mother   . Cancer Father   . Diabetes Father   . Hypertension Father   . Hyperlipidemia Father    History  Substance Use Topics  . Smoking status: Never Smoker   . Smokeless tobacco: Never Used  . Alcohol Use: No    Review of Systems   Constitutional: Negative for fever and chills.  HENT: Negative for congestion.   Eyes: Negative for visual disturbance.  Respiratory: Positive for cough. Negative for shortness of breath.   Cardiovascular: Negative for leg swelling.  Gastrointestinal: Negative for vomiting and abdominal pain.  Genitourinary: Negative for dysuria and flank pain.  Musculoskeletal: Negative for back pain, neck pain and neck stiffness.  Skin: Negative for rash.  Neurological: Negative for light-headedness and headaches.    Allergies  Review of patient's allergies indicates no known allergies.  Home Medications   Current Outpatient Rx  Name  Route  Sig  Dispense  Refill  . colchicine 0.6 MG tablet   Oral   Take 0.6 mg by mouth 2 (two) times daily as needed. For GOUT PAIN         . Febuxostat (ULORIC) 80 MG TABS   Oral   Take 1 tablet by mouth daily as needed. For GOUT/HIGH URIC ACID         . HYDROcodone-acetaminophen (NORCO/VICODIN) 5-325 MG per tablet   Oral   Take 1-2 tablets by mouth every 4 (four) hours as needed for moderate pain.         . metoprolol (LOPRESSOR) 50 MG tablet   Oral   Take 1 tablet (50 mg total)  by mouth daily.   7 tablet   0   . sulfamethoxazole-trimethoprim (SEPTRA DS) 800-160 MG per tablet   Oral   Take 1 tablet by mouth 2 (two) times daily.   14 tablet   0    BP 153/90  Pulse 65  Temp(Src) 97.6 F (36.4 C) (Oral)  Resp 18  Ht 5\' 11"  (1.803 m)  Wt 270 lb (122.471 kg)  BMI 37.67 kg/m2  SpO2 100% Physical Exam  Nursing note and vitals reviewed. Constitutional: He is oriented to person, place, and time. He appears well-developed and well-nourished.  HENT:  Head: Normocephalic and atraumatic.  Eyes: Conjunctivae are normal. Right eye exhibits no discharge. Left eye exhibits no discharge.  Neck: Normal range of motion. Neck supple. No tracheal deviation present.  Cardiovascular: Normal rate and regular rhythm.   Pulmonary/Chest: Effort normal and  breath sounds normal.  Abdominal: Soft. He exhibits no distension. There is no tenderness. There is no guarding.  Musculoskeletal: He exhibits no edema and no tenderness (no calf pain or swelling).  Neurological: He is alert and oriented to person, place, and time.  Skin: Skin is warm. No rash noted.  Psychiatric: He has a normal mood and affect.    ED Course  Procedures (including critical care time) Labs Review Labs Reviewed - No data to display Imaging Review Dg Chest 2 View  02/03/2013   CLINICAL DATA:  Chest pain.  Cough.  Hypertension.  EXAM: CHEST  2 VIEW  COMPARISON:  10/28/2012  FINDINGS: The heart size and mediastinal contours are within normal limits. Both lungs are clear. The visualized skeletal structures are unremarkable.  IMPRESSION: No active cardiopulmonary disease.   Electronically Signed   By: Earle Gell M.D.   On: 02/03/2013 20:18    EKG Interpretation   None       MDM   1. Cough    Well appearing, vitals unremarkable- mild high bp that will be fup outpt.  Considered PE however clinically bronchitis, no sob, normal hr, normal O2, well appearing, pt had a long hospitalization/ reasons for dvt years ago.  CXR and close fup stressed along with reasons to return. Well appearing on recheck, pt discussed main reasons for visit was for narcotics for his intermittent gout sxs, no evidence of gout flare or infection on exam, discussed no need for narcotics for these reasons. Fup outpt.  Filed Vitals:   02/03/13 1855  BP: 153/90  Pulse: 65  Temp: 97.6 F (36.4 C)  TempSrc: Oral  Resp: 18  Height: 5\' 11"  (1.803 m)  Weight: 270 lb (122.471 kg)  SpO2: 100%    Results and differential diagnosis were discussed with the patient. Close follow up outpatient was discussed, patient comfortable with the plan.   Diagnosis: Cough, bronchitis      [name], MD 02/03/13 2223

## 2013-02-03 NOTE — ED Notes (Signed)
Patient complaining of cough starting this morning. States "my chest hurts when I cough because I'm coughing so much."

## 2013-02-14 ENCOUNTER — Ambulatory Visit (HOSPITAL_COMMUNITY): Payer: Medicare Other | Admitting: Psychiatry

## 2013-03-09 IMAGING — CR DG TOE GREAT 2+V*R*
1 series · 1 of 1 positions shown · non-contrast
Comparison: None.

***ADDENDUM*** CREATED: 09/06/2011 [DATE]

Comparison exam 03/10/2009.  The degenerate changes have progressed
in the interval from prior exam.
***END ADDENDUM*** SIGNED BY: Verjan Poll, M.D.
This sentence should read degenerative not degenerate changes.
CLINICAL DATA: Pain and swelling
RIGHT GREAT TOE

[view not recorded]
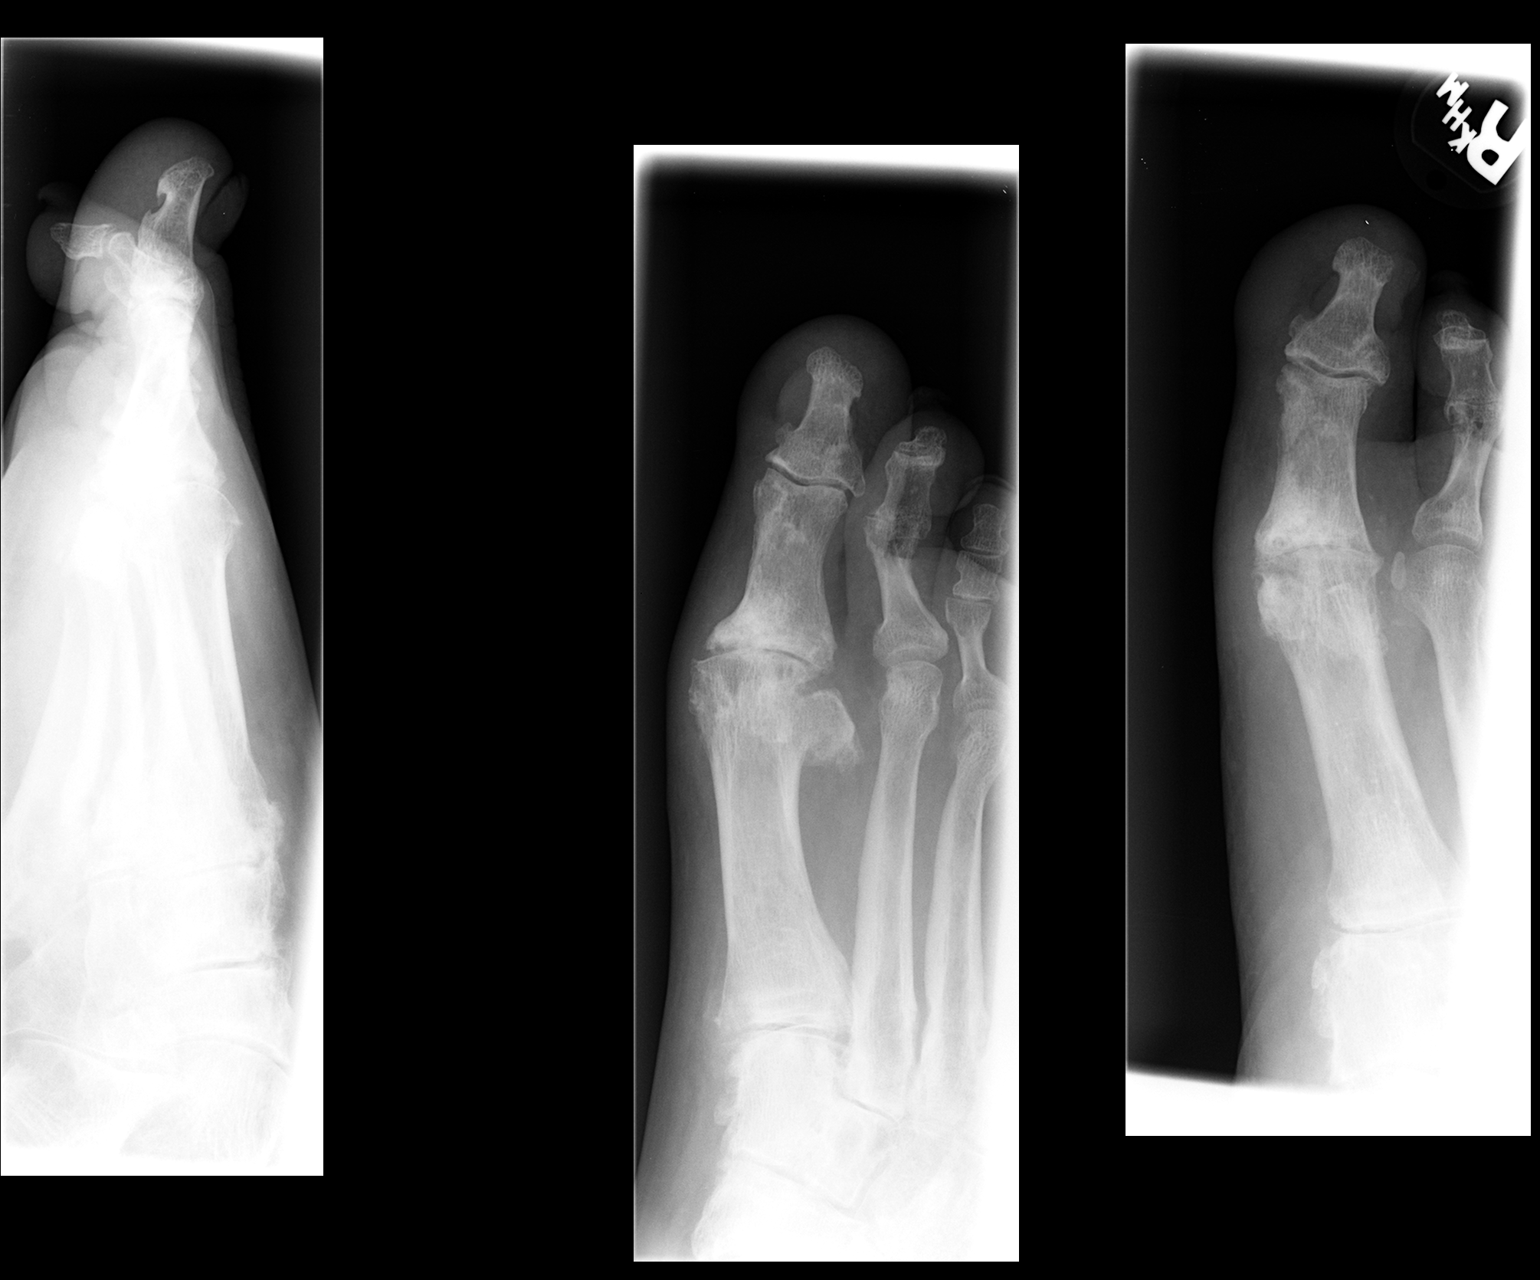

[1 of 1 positions shown; findings below may reference images not displayed]

FINDINGS: Degenerate changes are noted in the first metatarsal
phalangeal joint.  No acute fracture or dislocation is seen.  No
erosive changes to suggest osteomyelitis are noted.
IMPRESSION: Degenerative change without acute abnormality.

## 2013-03-28 ENCOUNTER — Inpatient Hospital Stay (HOSPITAL_COMMUNITY)
Admission: EM | Admit: 2013-03-28 | Discharge: 2013-03-30 | DRG: 641 | Disposition: A | Discharge status: Home / Self Care | Payer: Medicare Other | Attending: Internal Medicine | Admitting: Internal Medicine

## 2013-03-28 ENCOUNTER — Observation Stay (HOSPITAL_COMMUNITY): Payer: Medicare Other

## 2013-03-28 ENCOUNTER — Encounter (HOSPITAL_COMMUNITY): Payer: Self-pay | Admitting: Emergency Medicine

## 2013-03-28 DIAGNOSIS — D72829 Elevated white blood cell count, unspecified: Secondary | ICD-10-CM | POA: Diagnosis present

## 2013-03-28 DIAGNOSIS — I454 Nonspecific intraventricular block: Secondary | ICD-10-CM

## 2013-03-28 DIAGNOSIS — M1A00X1 Idiopathic chronic gout, unspecified site, with tophus (tophi): Secondary | ICD-10-CM | POA: Diagnosis present

## 2013-03-28 DIAGNOSIS — E875 Hyperkalemia: Secondary | ICD-10-CM | POA: Diagnosis present

## 2013-03-28 DIAGNOSIS — Z86718 Personal history of other venous thrombosis and embolism: Secondary | ICD-10-CM | POA: Diagnosis not present

## 2013-03-28 DIAGNOSIS — E860 Dehydration: Secondary | ICD-10-CM

## 2013-03-28 DIAGNOSIS — E1149 Type 2 diabetes mellitus with other diabetic neurological complication: Secondary | ICD-10-CM | POA: Diagnosis present

## 2013-03-28 DIAGNOSIS — R262 Difficulty in walking, not elsewhere classified: Secondary | ICD-10-CM

## 2013-03-28 DIAGNOSIS — Z8249 Family history of ischemic heart disease and other diseases of the circulatory system: Secondary | ICD-10-CM | POA: Diagnosis not present

## 2013-03-28 DIAGNOSIS — I872 Venous insufficiency (chronic) (peripheral): Secondary | ICD-10-CM | POA: Diagnosis present

## 2013-03-28 DIAGNOSIS — R29898 Other symptoms and signs involving the musculoskeletal system: Secondary | ICD-10-CM

## 2013-03-28 DIAGNOSIS — I4510 Unspecified right bundle-branch block: Secondary | ICD-10-CM | POA: Diagnosis present

## 2013-03-28 DIAGNOSIS — B351 Tinea unguium: Secondary | ICD-10-CM | POA: Diagnosis present

## 2013-03-28 DIAGNOSIS — R197 Diarrhea, unspecified: Secondary | ICD-10-CM

## 2013-03-28 DIAGNOSIS — R279 Unspecified lack of coordination: Secondary | ICD-10-CM

## 2013-03-28 DIAGNOSIS — M129 Arthropathy, unspecified: Secondary | ICD-10-CM | POA: Diagnosis present

## 2013-03-28 DIAGNOSIS — R112 Nausea with vomiting, unspecified: Secondary | ICD-10-CM | POA: Diagnosis present

## 2013-03-28 DIAGNOSIS — I10 Essential (primary) hypertension: Secondary | ICD-10-CM

## 2013-03-28 DIAGNOSIS — M6281 Muscle weakness (generalized): Secondary | ICD-10-CM

## 2013-03-28 DIAGNOSIS — I129 Hypertensive chronic kidney disease with stage 1 through stage 4 chronic kidney disease, or unspecified chronic kidney disease: Secondary | ICD-10-CM | POA: Diagnosis present

## 2013-03-28 DIAGNOSIS — M109 Gout, unspecified: Secondary | ICD-10-CM | POA: Diagnosis present

## 2013-03-28 DIAGNOSIS — N184 Chronic kidney disease, stage 4 (severe): Secondary | ICD-10-CM

## 2013-03-28 DIAGNOSIS — E1142 Type 2 diabetes mellitus with diabetic polyneuropathy: Secondary | ICD-10-CM | POA: Diagnosis present

## 2013-03-28 DIAGNOSIS — Z833 Family history of diabetes mellitus: Secondary | ICD-10-CM

## 2013-03-28 DIAGNOSIS — N186 End stage renal disease: Secondary | ICD-10-CM | POA: Diagnosis present

## 2013-03-28 DIAGNOSIS — Z6838 Body mass index (BMI) 38.0-38.9, adult: Secondary | ICD-10-CM | POA: Diagnosis not present

## 2013-03-28 DIAGNOSIS — Z992 Dependence on renal dialysis: Secondary | ICD-10-CM

## 2013-03-28 DIAGNOSIS — R2689 Other abnormalities of gait and mobility: Secondary | ICD-10-CM

## 2013-03-28 DIAGNOSIS — R1110 Vomiting, unspecified: Secondary | ICD-10-CM

## 2013-03-28 LAB — BASIC METABOLIC PANEL
BUN: 70 mg/dL — ABNORMAL HIGH (ref 6–23)
BUN: 67 mg/dL — AB (ref 6–23)
CHLORIDE: 103 meq/L (ref 96–112)
CHLORIDE: 104 meq/L (ref 96–112)
CO2: 21 meq/L (ref 19–32)
CO2: 22 mEq/L (ref 19–32)
Calcium: 9.6 mg/dL (ref 8.4–10.5)
Calcium: 9.1 mg/dL (ref 8.4–10.5)
Creatinine, Ser: 3.55 mg/dL — ABNORMAL HIGH (ref 0.50–1.35)
Creatinine, Ser: 3.59 mg/dL — ABNORMAL HIGH (ref 0.50–1.35)
GFR calc Af Amer: 22 mL/min — ABNORMAL LOW (ref >90)
GFR calc non Af Amer: 19 mL/min — ABNORMAL LOW (ref >90)
GFR, EST AFRICAN AMERICAN: 22 mL/min — AB (ref >90)
GFR, EST NON AFRICAN AMERICAN: 19 mL/min — AB (ref >90)
GLUCOSE: 82 mg/dL (ref 70–99)
Glucose, Bld: 86 mg/dL (ref 70–99)
POTASSIUM: 5.3 meq/L (ref 3.7–5.3)
Potassium: 6.3 mEq/L — ABNORMAL HIGH (ref 3.7–5.3)
Sodium: 138 mEq/L (ref 137–147)
Sodium: 140 mEq/L (ref 137–147)

## 2013-03-28 LAB — I-STAT CHEM 8, ED
BUN: 64 mg/dL — AB (ref 6–23)
CHLORIDE: 111 meq/L (ref 96–112)
Calcium, Ion: 1.13 mmol/L (ref 1.12–1.23)
Creatinine, Ser: 3.7 mg/dL — ABNORMAL HIGH (ref 0.50–1.35)
GLUCOSE: 89 mg/dL (ref 70–99)
HCT: 49 % (ref 39.0–52.0)
Hemoglobin: 16.7 g/dL (ref 13.0–17.0)
Potassium: 6.2 mEq/L — ABNORMAL HIGH (ref 3.7–5.3)
Sodium: 140 mEq/L (ref 137–147)
TCO2: 20 mmol/L (ref 0–100)

## 2013-03-28 LAB — CBC WITH DIFFERENTIAL/PLATELET
Basophils Absolute: 0 10*3/uL (ref 0.0–0.1)
Basophils Relative: 0 % (ref 0–1)
EOS PCT: 0 % (ref 0–5)
Eosinophils Absolute: 0 10*3/uL (ref 0.0–0.7)
HCT: 45 % (ref 39.0–52.0)
Hemoglobin: 14.3 g/dL (ref 13.0–17.0)
LYMPHS PCT: 4 % — AB (ref 12–46)
Lymphs Abs: 0.5 10*3/uL — ABNORMAL LOW (ref 0.7–4.0)
MCH: 26.8 pg (ref 26.0–34.0)
MCHC: 31.8 g/dL (ref 30.0–36.0)
MCV: 84.3 fL (ref 78.0–100.0)
Monocytes Absolute: 0.6 10*3/uL (ref 0.1–1.0)
Monocytes Relative: 5 % (ref 3–12)
NEUTROS ABS: 11.5 10*3/uL — AB (ref 1.7–7.7)
Neutrophils Relative %: 91 % — ABNORMAL HIGH (ref 43–77)
PLATELETS: 201 10*3/uL (ref 150–400)
RBC: 5.34 MIL/uL (ref 4.22–5.81)
RDW: 16.4 % — ABNORMAL HIGH (ref 11.5–15.5)
WBC: 12.7 10*3/uL — ABNORMAL HIGH (ref 4.0–10.5)

## 2013-03-28 LAB — TROPONIN I: Troponin I: <0.3 ng/mL (ref <0.30)

## 2013-03-28 LAB — CBG MONITORING, ED: GLUCOSE-CAPILLARY: 76 mg/dL (ref 70–99)

## 2013-03-28 MED ORDER — SODIUM CHLORIDE 0.9 % IV BOLUS (SEPSIS)
500.0000 mL | Freq: Once | INTRAVENOUS | Status: AC
  Administered 2013-03-28: 500 mL via INTRAVENOUS

## 2013-03-28 MED ORDER — ONDANSETRON HCL 4 MG/2ML IJ SOLN
4.0000 mg | Freq: Four times a day (QID) | INTRAMUSCULAR | Status: DC | PRN
  Administered 2013-03-28 – 2013-03-30 (×2): 4 mg via INTRAVENOUS
  Filled 2013-03-28 (×2): qty 2

## 2013-03-28 MED ORDER — METOPROLOL TARTRATE 50 MG PO TABS
50.0000 mg | ORAL_TABLET | Freq: Two times a day (BID) | ORAL | Status: DC
  Administered 2013-03-28 – 2013-03-30 (×3): 50 mg via ORAL
  Filled 2013-03-28 (×3): qty 1

## 2013-03-28 MED ORDER — DEXTROSE 50 % IV SOLN
INTRAVENOUS | Status: AC
  Filled 2013-03-28: qty 50

## 2013-03-28 MED ORDER — ONDANSETRON 8 MG PO TBDP
8.0000 mg | ORAL_TABLET | Freq: Once | ORAL | Status: AC
  Administered 2013-03-28: 8 mg via ORAL
  Filled 2013-03-28: qty 1

## 2013-03-28 MED ORDER — ACETAMINOPHEN 650 MG RE SUPP: 650.0000 mg | Freq: Four times a day (QID) | RECTAL | Status: DC | PRN

## 2013-03-28 MED ORDER — ACETAMINOPHEN 325 MG PO TABS
650.0000 mg | ORAL_TABLET | Freq: Four times a day (QID) | ORAL | Status: DC | PRN
  Administered 2013-03-29 (×2): 650 mg via ORAL
  Filled 2013-03-28 (×2): qty 2

## 2013-03-28 MED ORDER — ENOXAPARIN SODIUM 40 MG/0.4ML ~~LOC~~ SOLN: 40.0000 mg | SUBCUTANEOUS | Status: DC

## 2013-03-28 MED ORDER — SODIUM CHLORIDE 0.9 % IV BOLUS (SEPSIS)
250.0000 mL | Freq: Once | INTRAVENOUS | Status: AC
  Administered 2013-03-28: 1000 mL via INTRAVENOUS

## 2013-03-28 MED ORDER — HEPARIN SODIUM (PORCINE) 5000 UNIT/ML IJ SOLN
5000.0000 [IU] | Freq: Three times a day (TID) | INTRAMUSCULAR | Status: DC
  Administered 2013-03-28 – 2013-03-30 (×5): 5000 [IU] via SUBCUTANEOUS
  Filled 2013-03-28 (×4): qty 1

## 2013-03-28 MED ORDER — INSULIN ASPART 100 UNIT/ML IV SOLN
10.0000 [IU] | Freq: Once | INTRAVENOUS | Status: AC
  Administered 2013-03-28: 10 [IU] via INTRAVENOUS

## 2013-03-28 MED ORDER — DEXTROSE 50 % IV SOLN
1.0000 | Freq: Once | INTRAVENOUS | Status: AC
  Administered 2013-03-28: 50 mL via INTRAVENOUS

## 2013-03-28 MED ORDER — HYDROCODONE-ACETAMINOPHEN 5-325 MG PO TABS
1.0000 | ORAL_TABLET | ORAL | Status: DC | PRN
  Filled 2013-03-28: qty 1

## 2013-03-28 MED ORDER — MORPHINE SULFATE 2 MG/ML IJ SOLN
2.0000 mg | Freq: Once | INTRAMUSCULAR | Status: AC
  Administered 2013-03-28: 2 mg via INTRAVENOUS
  Filled 2013-03-28: qty 1

## 2013-03-28 MED ORDER — ONDANSETRON HCL 4 MG PO TABS
4.0000 mg | ORAL_TABLET | Freq: Four times a day (QID) | ORAL | Status: DC | PRN
  Administered 2013-03-29 – 2013-03-30 (×5): 4 mg via ORAL
  Filled 2013-03-28 (×5): qty 1

## 2013-03-28 MED ORDER — SODIUM CHLORIDE 0.9 % IV SOLN
INTRAVENOUS | Status: AC
  Administered 2013-03-29: 01:00:00 via INTRAVENOUS

## 2013-03-28 MED ORDER — SODIUM POLYSTYRENE SULFONATE 15 GM/60ML PO SUSP
30.0000 g | Freq: Once | ORAL | Status: AC
  Administered 2013-03-28: 30 g via ORAL
  Filled 2013-03-28: qty 120

## 2013-03-28 MED ORDER — FUROSEMIDE 10 MG/ML IJ SOLN
40.0000 mg | Freq: Once | INTRAMUSCULAR | Status: AC
  Administered 2013-03-28: 40 mg via INTRAVENOUS
  Filled 2013-03-28: qty 4

## 2013-03-28 MED ORDER — SODIUM CHLORIDE 0.9 % IJ SOLN
3.0000 mL | Freq: Two times a day (BID) | INTRAMUSCULAR | Status: DC
  Administered 2013-03-28 – 2013-03-29 (×2): 3 mL via INTRAVENOUS

## 2013-03-28 MED ORDER — SODIUM POLYSTYRENE SULFONATE 15 GM/60ML PO SUSP: 30.0000 g | Freq: Once | ORAL | Status: DC

## 2013-03-28 MED ORDER — COLCHICINE 0.6 MG PO TABS: 0.6000 mg | ORAL_TABLET | Freq: Two times a day (BID) | ORAL | Status: DC | PRN

## 2013-03-28 NOTE — ED Provider Notes (Signed)
CSN: [id number]     Arrival date & time 03/28/13  1325 History  This chart was scribed for Sharyon Cable, MD by Era Bumpers, ED scribe. This patient was seen in room APA04/APA04 and the patient's care was started at 2:00 PM .    Chief Complaint  Patient presents with  . Emesis  . Diarrhea    Patient is a 48 y.o. male presenting with diarrhea.  Diarrhea Associated symptoms: abdominal pain, headaches and vomiting   HPI Comments: Jack Irwin is a 48 y.o. male who was brought in by EMS to the Emergency Department complaining of moderate diarrhea and multiple emesis episodes beginning last night. He has associated nausea, vomiting, headache,dizziness abdominal pain, sore throat, and cough. He denies blood in either vomitus or stool. Pt reports his stepson and granddaughter were sick as well, and he hasn't been in the hospital recently nor has he been on any antibiotics. Pt denies syncope. He reports his course is worsening   Past Medical History  Diagnosis Date  . Hypertension   . Diabetes mellitus   . Gout   . Chronic back pain   . BPH (benign prostatic hyperplasia)   . Anemia   . DVT (deep venous thrombosis)   . Neuropathy   . Decubitus ulcer     of 2nd toes of both feet.  . Edema leg   . Constipation   . Venous (peripheral) insufficiency   . Neuropathy, diabetic   . Physical deconditioning   . Poor balance   . Difficulty walking   . Lack of coordination   . Arthritis   . Renal insufficiency   . Chronic kidney disease   . Chronic kidney disease (CKD), stage IV (severe)    Past Surgical History  Procedure Laterality Date  . None     Family History  Problem Relation Age of Onset  . Diabetes Mother   . Hypertension Mother   . Heart failure Mother   . Hyperlipidemia Mother   . Cancer Father   . Diabetes Father   . Hypertension Father   . Hyperlipidemia Father    History  Substance Use Topics  . Smoking status: Never Smoker   . Smokeless tobacco: Never  Used  . Alcohol Use: No    Review of Systems  HENT: Positive for sore throat.   Respiratory: Positive for cough.   Gastrointestinal: Positive for nausea, vomiting, abdominal pain and diarrhea.  Neurological: Positive for dizziness and headaches. Negative for syncope.  All other systems reviewed and are negative.      Allergies  Review of patient's allergies indicates no known allergies.  Home Medications   Current Outpatient Rx  Name  Route  Sig  Dispense  Refill  . colchicine 0.6 MG tablet   Oral   Take 0.6 mg by mouth 2 (two) times daily as needed. For GOUT PAIN         . Febuxostat (ULORIC) 80 MG TABS   Oral   Take 13 tablets by mouth daily. For GOUT/HIGH URIC ACID         . HYDROcodone-acetaminophen (NORCO/VICODIN) 5-325 MG per tablet   Oral   Take 1-2 tablets by mouth every 4 (four) hours as needed for moderate pain.         . metoprolol (LOPRESSOR) 50 MG tablet   Oral   Take 50 mg by mouth 2 (two) times daily.          Triage Vitals: BP 158/98  Pulse  88  Temp(Src) 98.3 F (36.8 C) (Oral)  Resp 20  Ht 5\' 11"  (1.803 m)  Wt 272 lb (123.378 kg)  BMI 37.95 kg/m2  SpO2 96% BP 163/101  Pulse 88  Temp(Src) 98.3 F (36.8 C) (Oral)  Resp 15  Ht 5\' 11"  (1.803 m)  Wt 272 lb (123.378 kg)  BMI 37.95 kg/m2  SpO2 96%   Physical Exam CONSTITUTIONAL: Well developed/well nourished HEAD: Normocephalic/atraumatic EYES: EOMI/PERRL, no icterus ENMT: Mucous membranes dry NECK: supple no meningeal signs SPINE:entire spine nontender CV: S1/S2 noted, no murmurs/rubs/gallops noted LUNGS: Lungs are clear to auscultation bilaterally, no apparent distress ABDOMEN: soft, nontender, no rebound or guarding GU:no cva tenderness NEURO: Pt is awake/alert, moves all extremitiesx4 EXTREMITIES: pulses normal, full ROM SKIN: warm, color normal PSYCH: no abnormalities of mood noted  ED Course  Procedures   CRITICAL CARE Performed by: Sharyon Cable Total  critical care time: 31 Critical care time was exclusive of separately billable procedures and treating other patients. Critical care was necessary to treat or prevent imminent or life-threatening deterioration. Critical care was time spent personally by me on the following activities: development of treatment plan with patient and/or surrogate as well as nursing, discussions with consultants, evaluation of patient's response to treatment, examination of patient, obtaining history from patient or surrogate, ordering and performing treatments and interventions, ordering and review of laboratory studies, ordering and review of radiographic studies, pulse oximetry and re-evaluation of patient's condition.   DIAGNOSTIC STUDIES: Oxygen Saturation is 96% on room air, normal by my interpretation.    COORDINATION OF CARE: At 2:04 PM Discussed treatment plan with patient which includes nausea medicine by mouth and bloodwork. Patient agrees.  hyperKalemia noted on initial labs though may have hemolysis Will check BMP  4:15 PM Hyperkalemia noted with h/o chronic renal failure Will need admission for correction of hyperkalemia  Ordered IV insulin/glucose, lasix and will need tele admission Will defer kayexalate due to diarrhea D/w dr Sarajane Jews, with [facility] Review Labs Reviewed  BASIC METABOLIC PANEL - Abnormal; Notable for the following:    Potassium 6.3 (*)    BUN 70 (*)    Creatinine, Ser 3.55 (*)    GFR calc non Af Amer 19 (*)    GFR calc Af Amer 22 (*)    All other components within normal limits  CBC WITH DIFFERENTIAL - Abnormal; Notable for the following:    WBC 12.7 (*)    RDW 16.4 (*)    Neutrophils Relative % 91 (*)    Neutro Abs 11.5 (*)    Lymphocytes Relative 4 (*)    Lymphs Abs 0.5 (*)    All other components within normal limits  I-STAT CHEM 8, ED - Abnormal; Notable for the following:    Potassium 6.2 (*)    BUN 64 (*)    Creatinine, Ser 3.70 (*)    All other components  within normal limits     EKG Interpretation    Date/Time:  Monday March 28 2013 15:22:20 EST Ventricular Rate:  78 PR Interval:  140 QRS Duration: 128 QT Interval:  386 QTC Calculation: 440 R Axis:   -29 Text Interpretation:  Normal sinus rhythm Right bundle branch block Abnormal ECG When compared with ECG of 28-Mar-2013 15:14, No significant change was found Confirmed by Christy Gentles  MD, Idalis Hoelting [phone]) on 03/28/2013 3:31:06 PM            MDM   Final diagnoses:  Dehydration  Hyperkalemia  Vomiting and diarrhea  Nursing notes including past medical history and social history reviewed and considered in documentation Labs/vital reviewed and considered Previous records reviewed and considered   I personally performed the services described in this documentation, which was scribed in my presence. The recorded information has been reviewed and is accurate.       Sharyon Cable, MD 03/28/13 [phone]

## 2013-03-28 NOTE — ED Notes (Signed)
Pt c/o nausea and vomiting since 0200 this morning. Pt alert and oriented. Mucous membranes pink.

## 2013-03-28 NOTE — H&P (Signed)
[facility] History and Physical  ALLANTE MOUSEL [id number] DOB: [birth year] DOA: 03/28/2013  Referring physician:  PCP: Philis Fendt, MD   Chief Complaint: Nausea and vomiting diarrhea HPI: Jack Irwin is a 48 y.o. male with a past medical history that includes hypertension, diabetes, gout, chronic kidney disease stage IV presents to the emergency department with the chief complaint of nausea vomiting and diarrhea. History obtained from patient reports that at 2 AM this morning he awakened with sudden onset of nausea vomiting. He reports several episodes of emesis mostly undigested food. That progressed to dry heaves. Denies any coffee ground emesis. Later in the morning he developed moderate diarrhea. He reports liquid stool with several episodes light brown in color. Denies any bright red blood or melena. He denies chest pain palpitations shortness of breath. Associated symptoms include headache, chills, abdominal pain. Reports the pain is intermittent cramp-like and located throughout his abdomen. He denies any recent antibiotics but does report family members were ill last week. Denies any recent travel or unusual diet changes.  In the emergency department he is found to be hyperkalemic with leukocytosis. EKG reveals NSR with a right BBB that according to chart review appears to be new.  Vital signs significant for a blood pressure of 163/101 otherwise they are stable. He is not hypoxic.  In the emergency room he is given 40 mg of Lasix IV, 10 units of insulin IV, 1 ampule of D50, Zofran and 750 mils of normal saline.   Review of Systems:  10 point review of systems complete and all systems are negative except as indicated in the history of present illness  Past Medical History  Diagnosis Date  . Hypertension   . Diabetes mellitus   . Gout   . Chronic back pain   . BPH (benign prostatic hyperplasia)   . Anemia   . DVT (deep venous thrombosis)   . Neuropathy     . Decubitus ulcer     of 2nd toes of both feet.  . Edema leg   . Constipation   . Venous (peripheral) insufficiency   . Neuropathy, diabetic   . Physical deconditioning   . Poor balance   . Difficulty walking   . Lack of coordination   . Arthritis   . Renal insufficiency   . Chronic kidney disease   . Chronic kidney disease (CKD), stage IV (severe)    Past Surgical History  Procedure Laterality Date  . None     Social History:  reports that he has never smoked. He has never used smokeless tobacco. He reports that he does not drink alcohol or use illicit drugs. He is married lives at home with his wife and children. He is on disability. He is independent with ADLs No Known Allergies  Family History  Problem Relation Age of Onset  . Diabetes Mother   . Hypertension Mother   . Heart failure Mother   . Hyperlipidemia Mother   . Cancer Father   . Diabetes Father   . Hypertension Father   . Hyperlipidemia Father      Prior to Admission medications   Medication Sig Start Date End Date Taking? Authorizing Provider  colchicine 0.6 MG tablet Take 0.6 mg by mouth 2 (two) times daily as needed. For GOUT PAIN   Yes Historical Provider, MD  Febuxostat (ULORIC) 80 MG TABS Take 13 tablets by mouth daily. For GOUT/HIGH URIC ACID   Yes Historical Provider, MD  HYDROcodone-acetaminophen (NORCO/VICODIN) 5-325 MG per  tablet Take 1-2 tablets by mouth every 4 (four) hours as needed for moderate pain.   Yes Historical Provider, MD  metoprolol (LOPRESSOR) 50 MG tablet Take 50 mg by mouth 2 (two) times daily. 11/02/12  Yes Sharyon Cable, MD   Physical Exam: Filed Vitals:   03/28/13 1600  BP: 163/101  Pulse:   Temp:   Resp: 15    BP 163/101  Pulse 88  Temp(Src) 98.3 F (36.8 C) (Oral)  Resp 15  Ht 5\' 11"  (1.803 m)  Wt 123.378 kg (272 lb)  BMI 37.95 kg/m2  SpO2 96%  General:  Appears calm and comfortable Eyes: PERRL, normal lids, irises & conjunctiva ENT: Ears are clear nose  without drainage oropharynx without erythema or exudate Neck: no LAD, masses or thyromegaly Cardiovascular: RRR, no m/r/g. Trace nonpitting lower extremity edema Telemetry: SR, no arrhythmias  Respiratory: CTA bilaterally, no w/r/r. Normal respiratory effort. Abdomen: Mild diffuse tenderness throughout. Obese. Soft. Positive bowel sounds. No guarding or rebounding Skin: no rash or induration seen on limited exam Musculoskeletal: grossly normal tone BUE/BLE Psychiatric: grossly normal mood and affect, speech fluent and appropriate Neurologic: grossly non-focal.          Labs on Admission:  Basic Metabolic Panel:  Recent Labs Lab 03/28/13 1434 03/28/13 1508  NA 140 138  K 6.2* 6.3*  CL 111 103  CO2  --  21  GLUCOSE 89 86  BUN 64* 70*  CREATININE 3.70* 3.55*  CALCIUM  --  9.6   Liver Function Tests: No results found for this basename: AST, ALT, ALKPHOS, BILITOT, PROT, ALBUMIN,  in the last 168 hours No results found for this basename: LIPASE, AMYLASE,  in the last 168 hours No results found for this basename: AMMONIA,  in the last 168 hours CBC:  Recent Labs Lab 03/28/13 1434 03/28/13 1508  WBC  --  12.7*  NEUTROABS  --  11.5*  HGB 16.7 14.3  HCT 49.0 45.0  MCV  --  84.3  PLT  --  201   Cardiac Enzymes: No results found for this basename: CKTOTAL, CKMB, CKMBINDEX, TROPONINI,  in the last 168 hours  BNP (last 3 results) No results found for this basename: PROBNP,  in the last 8760 hours CBG: No results found for this basename: GLUCAP,  in the last 168 hours  Radiological Exams on Admission: No results found.  EKG: Independently reviewed normal sinus rhythm with a right bundle branch block  Assessment/Plan Principal Problem:   Hyperkalemia: Likely related to acute illness in setting of chronic kidney disease. Admit to telemetry for observation. Will give Kayexalate. Will recheck in 4 hours. Will monitor and replace other electrolytes as indicated. Monitor  stools given patient's current acute diarrhea. Provide IV fluids to maintain hydration. Active Problems: Nausea vomiting and diarrhea: Likely viral. Will obtain GI panel. Will provide supportive therapy specifically clear liquids and antiemetic.     BBB (bundle branch block): Chart review indicates new. Will monitor on telemetry. We'll cycle his troponins. He denies any chest pain or palpitations of late. Repeat his EKG in the morning.  Leukocytosis: Likely related to #2. He is currently afebrile and nontoxic appearing. Will obtain urinalysis. If no improvement tomorrow we'll consider chest x-ray.  Chronic kidney disease stage IV: Current creatinine level appears to be at baseline range. Will gently hydrate given his persistent nausea vomiting and diarrhea. Monitor closely.    Hypertension: Uncontrolled in the emergency department to medications include metoprolol 50 mg twice a day.  Has not had his dose today. Will resume. Vomiting persist we'll convert to IV. Will use hydralazine when necessary for optimal control.    Arthritis, gouty: Appears stable at baseline. Will continue home medication    Code Status: full Family Communication: none present Disposition Plan: home when ready  Time spent: 32 minutes  [facility]

## 2013-03-28 NOTE — H&P (Signed)
Patient seen, independently examined and chart reviewed. I agree with exam, assessment and plan discussed with Dyanne Carrel, NP.  48 year old man with complex past medical history t detailed below who presented with acute onset of nausea, vomiting, abdominal pain diarrhea. Yesterday he felt well. This morning at 2 AM he awoke with vomiting, abdominal pain diarrhea, nonbloody. Abdominal pain generalized. No specific aggravating or alleviating factors. He reports his grandchildren and son had similar illness 2 days ago. No fever at home. No systemic symptoms. He was unable to take his medications today but is usually compliant. Initial evaluation in the emergency department, afebrile, hemodynamically stable. No hypoxia. Creatinine appear to be at baseline, however potassium elevated at 6.3. Modest leukocytosis 12.7. CBC otherwise unremarkable.initial EKG poor quality but appear to be sinus rhythm with incomplete right bundle branch block. Poor quality limits interpretation. Repeat EKG demonstrates normal sinus rhythm with right bundle branch block.compared to last study 11/29/2012 right bundle branch block is new.  He was referred for admission for hyperkalemia, vomiting and diarrhea. In emergency department he was treated with Lasix, insulin, dextrose.  PMH chronic kidney disease stage IV  diabetes mellitus  diabetic neuropathy Gout  Objective: Somewhat hypertensive but vitals otherwise stable. He appears calm and comfortable, lying on stretcher in ED. Speech is fluent and clear, mood and affect are grossly normal. He is a good historian. Eyes pupils equal, round, react to light. Normal lids, irises.ENT is grossly unremarkable. Neck no lymphadenopathy or masses. Cardiovascular regular rate and rhythm. No murmur, rub or gallop. 1+ bilateral lower extremity edema. Abdomen positive bowel sounds. Obese, soft, mild diffuse tenderness. No hernias. Pannus appears unremarkable. Overall benign examination. Skin he  has chronic hyperpigmentation of the toes secondary to gout and evidence of tophaceous gout right foot. Feet are warm and dry. Nontender. He has onychomycosis bilaterally.    Currently appears stable for admission to the medical floor. Plan observation overnight for nausea, vomiting, abdominal pain and diarrhea. History is highly suggestive of viral illness based on sick contacts but will check C. difficile. His abdominal exam is benign but will check acute abdominal series for completeness. There are no signs or symptoms to suggest severe illness or infection at this point. The etiology of his hyperkalemia is unclear as his kidney function does appear to be at baseline. He is not on any potassium supplementation, ACE inhibitor or ARB. Plan treatment with Kayexalate and repeat basic metabolic panel tonight and in the morning. He has no acute EKG changes.  New RBBB significance unclear. He has had no chest pain, shortness of breath or cardiac symptoms. However he has numerous risk factors, planned serial troponin overnight, repeat EKG in the morning. No signs or symptoms to suggest ACS.   Murray Hodgkins, MD [facility] [phone]

## 2013-03-28 NOTE — ED Notes (Signed)
Nausea and vomiting and diarrhea since 0200 this morning.

## 2013-03-28 NOTE — ED Notes (Signed)
Placed on cardiac monitor 

## 2013-03-29 DIAGNOSIS — I4510 Unspecified right bundle-branch block: Secondary | ICD-10-CM | POA: Diagnosis present

## 2013-03-29 LAB — CBC
HEMATOCRIT: 41 % (ref 39.0–52.0)
Hemoglobin: 13.1 g/dL (ref 13.0–17.0)
MCH: 26.9 pg (ref 26.0–34.0)
MCHC: 32 g/dL (ref 30.0–36.0)
MCV: 84.2 fL (ref 78.0–100.0)
Platelets: 209 10*3/uL (ref 150–400)
RBC: 4.87 MIL/uL (ref 4.22–5.81)
RDW: 16.6 % — ABNORMAL HIGH (ref 11.5–15.5)
WBC: 7.6 10*3/uL (ref 4.0–10.5)

## 2013-03-29 LAB — URINALYSIS, ROUTINE W REFLEX MICROSCOPIC
Bilirubin Urine: NEGATIVE
Glucose, UA: NEGATIVE mg/dL
Ketones, ur: NEGATIVE mg/dL
Leukocytes, UA: NEGATIVE
NITRITE: NEGATIVE
Protein, ur: 100 mg/dL — AB
SPECIFIC GRAVITY, URINE: 1.025 (ref 1.005–1.030)
Urobilinogen, UA: 0.2 mg/dL (ref 0.0–1.0)
pH: 5.5 (ref 5.0–8.0)

## 2013-03-29 LAB — GLUCOSE, CAPILLARY
GLUCOSE-CAPILLARY: 85 mg/dL (ref 70–99)
Glucose-Capillary: 76 mg/dL (ref 70–99)

## 2013-03-29 LAB — TROPONIN I

## 2013-03-29 LAB — BASIC METABOLIC PANEL
BUN: 67 mg/dL — ABNORMAL HIGH (ref 6–23)
CHLORIDE: 104 meq/L (ref 96–112)
CO2: 21 meq/L (ref 19–32)
Calcium: 8.5 mg/dL (ref 8.4–10.5)
Creatinine, Ser: 3.69 mg/dL — ABNORMAL HIGH (ref 0.50–1.35)
GFR calc Af Amer: 21 mL/min — ABNORMAL LOW (ref >90)
GFR calc non Af Amer: 18 mL/min — ABNORMAL LOW (ref >90)
Glucose, Bld: 90 mg/dL (ref 70–99)
POTASSIUM: 5 meq/L (ref 3.7–5.3)
Sodium: 137 mEq/L (ref 137–147)

## 2013-03-29 LAB — URINE MICROSCOPIC-ADD ON

## 2013-03-29 MED ORDER — FEBUXOSTAT 40 MG PO TABS
80.0000 mg | ORAL_TABLET | Freq: Every day | ORAL | Status: DC
  Administered 2013-03-29 – 2013-03-30 (×2): 80 mg via ORAL
  Filled 2013-03-29 (×4): qty 2

## 2013-03-29 MED ORDER — INSULIN ASPART 100 UNIT/ML ~~LOC~~ SOLN
0.0000 [IU] | Freq: Three times a day (TID) | SUBCUTANEOUS | Status: DC
Start: 2013-03-29 — End: 2013-03-30

## 2013-03-29 NOTE — Progress Notes (Addendum)
  PROGRESS NOTE  Jack Irwin [id number] DOB: 03/23/1965 DOA: 03/28/2013 PCP: Philis Fendt, MD  Summary: 48 year old man with complex past medical history presented with acute onset of nausea, vomiting, abdominal pain diarrhea. Positive sick contacts. Creatinine appear to be at baseline, however potassium elevated at 6.3. Hyperkalemia resolved with Kayexalate. Nausea, vomiting, abdominal pain, diarrhea resolving rapidly. Advance diet. Likely home in 24 hours.  Assessment/Plan: 1. Nausea, vomiting, abdominal pain, diarrhea. Clinically resolved. No diarrhea since admission, unable to collect stool sample. Highly suggestive of a viral gastroenteritis, multiple sick contacts at home. Abdominal imaging unremarkable. 2. Hyperkalemia. Resolved with one dose of Kayexalate. 3. Chronic kidney disease stage IV. Appears to be at baseline. 4. Diabetes mellitus. Stable. 5. New right bundle branch block. New since October 2014. Significance unclear. No cardiac symptoms. Troponin is negative. No further inpatient evaluation suggested.   Clinically improving. Advance diet. Recheck basic metabolic panel in the morning.  Anticipate discharge 2/25.  Code Status: full code DVT prophylaxis: heparin Family Communication: none present Disposition Plan:   Murray Hodgkins, MD  [facility]  Pager [phone] If 7PM-7AM, please contact night-coverage at [email or web address], password [facility] 03/29/2013, 2:55 PM  LOS: 1 day   Consultants:  none  Procedures:  none  HPI/Subjective: Better today. No vomiting or diarrhea. Tolerating liquids. Wants to advance to solids.  Objective: Filed Vitals:   03/28/13 1645 03/28/13 1700 03/28/13 1800 03/28/13 2021  BP:  140/99 149/103 127/92  Pulse: 94 97 87 89  Temp:    98.5 F (36.9 C)  TempSrc:    Oral  Resp: 23 28 21 20   Height:    5\' 11"  (1.803 m)  Weight:    123.3 kg (271 lb 13.2 oz)  SpO2: 100% 96% 96% 93%    Intake/Output Summary (Last 24 hours)  at 03/29/13 1455 Last data filed at 03/28/13 1742  Gross per 24 hour  Intake      0 ml  Output    775 ml  Net   -775 ml     Filed Weights   03/28/13 1330 03/28/13 2021  Weight: 123.378 kg (272 lb) 123.3 kg (271 lb 13.2 oz)    Exam:   Afebrile, vital signs stable. No hypoxia.  Gen. Appears calm and comfortable. Speech is fluent and clear.  Respiratory clear to auscultation bilaterally. No wheezes, rales or rhonchi. Normal respiratory effort.  Cardiovascular regular rate and rhythm. No murmur, rub or gallop. No lower extremity edema.  Abdomen soft nontender nondistended.  Data Reviewed:  Adequate urine output.  Potassium 5.0, BUN and creatinine at baseline.  CBC unremarkable.  Acute abdominal series, nonobstructive bowel gas pattern. No evidence of acute cardiopulmonary disease.  EKG shows sinus rhythm, right bundle branch block. No acute changes.  Scheduled Meds: . heparin subcutaneous  5,000 Units Subcutaneous 3 times per day  . metoprolol  50 mg Oral BID  . sodium chloride  3 mL Intravenous Q12H   Continuous Infusions:   Principal Problem:   Hyperkalemia Active Problems:   Arthritis, gouty   Leukocytosis   Nausea vomiting and diarrhea   BBB (bundle branch block)   Chronic kidney disease (CKD), stage IV (severe)   RBBB   Time spent 20 minutes

## 2013-03-29 NOTE — Progress Notes (Signed)
Pt lost IV access and IV fluids had expired, myself and [name] attempted IV insertion unsuccessfully, notified Dr. Sarajane Jews. He is aware that pt has had no more vomiting or diarrhea and that po zofran seemed to be effective, order received for no IV access needed, as pt will likely d/c in the morning. Jack Irwin

## 2013-03-30 DIAGNOSIS — I10 Essential (primary) hypertension: Secondary | ICD-10-CM

## 2013-03-30 DIAGNOSIS — M109 Gout, unspecified: Secondary | ICD-10-CM

## 2013-03-30 DIAGNOSIS — R1110 Vomiting, unspecified: Secondary | ICD-10-CM

## 2013-03-30 DIAGNOSIS — R262 Difficulty in walking, not elsewhere classified: Secondary | ICD-10-CM

## 2013-03-30 DIAGNOSIS — E860 Dehydration: Secondary | ICD-10-CM

## 2013-03-30 LAB — BASIC METABOLIC PANEL
BUN: 63 mg/dL — ABNORMAL HIGH (ref 6–23)
CO2: 22 meq/L (ref 19–32)
Calcium: 8.4 mg/dL (ref 8.4–10.5)
Chloride: 106 mEq/L (ref 96–112)
Creatinine, Ser: 4.18 mg/dL — ABNORMAL HIGH (ref 0.50–1.35)
GFR calc Af Amer: 18 mL/min — ABNORMAL LOW (ref >90)
GFR calc non Af Amer: 16 mL/min — ABNORMAL LOW (ref >90)
Glucose, Bld: 82 mg/dL (ref 70–99)
POTASSIUM: 4.8 meq/L (ref 3.7–5.3)
SODIUM: 139 meq/L (ref 137–147)

## 2013-03-30 LAB — GLUCOSE, CAPILLARY
GLUCOSE-CAPILLARY: 93 mg/dL (ref 70–99)
Glucose-Capillary: 83 mg/dL (ref 70–99)
Glucose-Capillary: 84 mg/dL (ref 70–99)

## 2013-03-30 LAB — MRSA PCR SCREENING: MRSA BY PCR: NEGATIVE

## 2013-03-30 MED ORDER — SODIUM CHLORIDE 0.9 % IV SOLN
INTRAVENOUS | Status: DC
  Administered 2013-03-30: 13:00:00 via INTRAVENOUS

## 2013-03-30 MED ORDER — SODIUM CHLORIDE 0.9 % IV SOLN: INTRAVENOUS | Status: DC

## 2013-03-30 MED ORDER — SODIUM CHLORIDE 0.9 % IV SOLN: INTRAVENOUS | Status: AC

## 2013-03-30 MED ORDER — ONDANSETRON HCL 4 MG PO TABS: 4.0000 mg | ORAL_TABLET | Freq: Four times a day (QID) | ORAL | Status: DC | PRN

## 2013-03-30 NOTE — Progress Notes (Signed)
Brief Nutrition Note  Received MD consult for obesity. Attempted education multiple times today, but pt occupied during times of visits. Noted pt is being discharged today.   Seryna Marek A. Jimmye Norman, RD, LDN Pager: [phone]

## 2013-03-30 NOTE — Care Management Note (Signed)
    Page 1 of 1   03/30/2013     3:41:49 PM   CARE MANAGEMENT NOTE 03/30/2013  Patient:  Jack Irwin   Account Number:  [id number]  Date Initiated:  03/30/2013  Documentation initiated by:  Theophilus Kinds  Subjective/Objective Assessment:   Pt admitted from home with diarrhea and hyperkalemia. Pt lives with his wife and will return home at discharge. Pt is fairly independent with ADL's. Pt has a cane and walker for prn use.     Action/Plan:   No CM needs noted. Pt discharged home today.   Anticipated DC Date:  03/30/2013   Anticipated DC Plan:  [place]  CM consult      Choice offered to / List presented to:             Status of service:  Completed, signed off Medicare Important Message given?  YES (If response is "NO", the following Medicare IM given date fields will be blank) Date Medicare IM given:  03/30/2013 Date Additional Medicare IM given:    Discharge Disposition:  HOME/SELF CARE  Per UR Regulation:    If discussed at Long Length of Stay Meetings, dates discussed:    Comments:  03/30/13 [place], RN BSN CM

## 2013-03-30 NOTE — Plan of Care (Addendum)
Confirmed with Dyanne Carrel that pt did need Bolus even though he currently had no IV access. Pt was educated as to why, but really was not happy about having to place another line.  He stated his creatine is always off. 3 attempts have been made but no IV access yet.(1251).  An ICU has agreed to come up and try. LF arm IV was successfully placed by ICU.  Beginning Bolus. (1320)

## 2013-03-30 NOTE — Discharge Planning (Signed)
Pt stated he was ready to go home and pain was under control.  Pt's IV DCd  Before DC and pt also received full bag of NS 1000cc before DC.  Pt was given DC papers and educated on High potassium s/sx.  Pt was told when it would be necessary to call Dr. Or return to the hospital.  Pt given script for zofran and told of FU appointments.  Pt has asked to take shower prior to leaving hospital, then will be wheeled to car by PCT and family once ride arrives and is ready.

## 2013-03-30 NOTE — Discharge Summary (Signed)
Evaluated patient and agree with assessment and plan

## 2013-03-30 NOTE — Discharge Summary (Signed)
Physician Discharge Summary  Jack Irwin [id number] DOB: 06-29-1965 DOA: 03/28/2013  PCP: Philis Fendt, MD  Admit date: 03/28/2013 Discharge date: 03/30/2013  Time spent: 40 minutes  Recommendations for Outpatient Follow-up:  1. Follow up with PCP in 1-2 weeks for evaluation of symptoms. Keep diet bland and advance slowly. Follow EKG 2. Dr. Hinda Lenis on 04/13/13 for evaluation of renal function. Lab work 04/11/13.  Discharge Diagnoses:  Principal Problem:   Hyperkalemia Active Problems:   Arthritis, gouty   Leukocytosis   Nausea vomiting and diarrhea   BBB (bundle branch block)   Chronic kidney disease (CKD), stage IV (severe)   RBBB   Morbid obesity   Discharge Condition: stable  Diet recommendation: bland soft advance as tolerated  Filed Weights   03/28/13 1330 03/28/13 2021 03/30/13 0509  Weight: 123.378 kg (272 lb) 123.3 kg (271 lb 13.2 oz) 120.339 kg (265 lb 4.8 oz)    History of present illness:  Jack Irwin is a 48 y.o. male with a past medical history that includes hypertension, diabetes, gout, chronic kidney disease stage IV presented to the emergency department on 03/28/13 with the chief complaint of nausea vomiting and diarrhea. History obtained from patient reported that at 2 AM he awakened with sudden onset of nausea vomiting. He reported several episodes of emesis mostly undigested food. That progressed to dry heaves. Denied any coffee ground emesis. Later in the morning he developed moderate diarrhea. He reported liquid stool with several episodes light brown in color. Denied any bright red blood or melena. He denied chest pain palpitations shortness of breath. Associated symptoms included headache, chills, abdominal pain. Reported the pain is intermittent cramp-like and located throughout his abdomen. He denied any recent antibiotics but does report family members were ill last week. Denied any recent travel or unusual diet changes.  In the emergency  department  found to be hyperkalemic with leukocytosis. EKG revealed NSR with a right BBB that according to chart review appeared to be new.  Vital signs significant for a blood pressure of 163/101 otherwise they were stable. He was not hypoxic.  In the emergency room he was given 40 mg of Lasix IV, 10 units of insulin IV, 1 ampule of D50, Zofran and 750 mils of normal saline.      Hospital Course:  Summary:  48 year old man with complex past medical history presented with acute onset of nausea, vomiting, abdominal pain diarrhea. Positive sick contacts. Creatinine appeared to be at baseline, however potassium elevated at 6.3. Hyperkalemia resolved with Kayexalate. Some elevation in creatinine on day of discharge. Given IV bolus.  Has appointment with Dr. Hinda Lenis 04/13/13.  Nausea, vomiting, abdominal pain, diarrhea resolving rapidly. Advanced diet.   Assessment/Plan:  1. Nausea, vomiting, abdominal pain, diarrhea. Admitted and provided with supportive therapy. Quickly resolved. Only one BM during hospitalization and  unable to get stool studies. Highly suggestive of a viral gastroenteritis, multiple sick contacts at home. Abdominal imaging unremarkable. At discharge tolerating bland soft diet. Instructed to advance diet slowly.  2. Hyperkalemia. Resolved with one dose of Kayexalate. Follow up with Dr. Lowanda Foster 04/13/13 for tracking.  3. Chronic kidney disease stage IV. Stable until day of discharge when creatinine bumped up somewhat. Given IV fluid bolus before discharge.  Discussed with Dr. Florentina Addison office and follow up appointment made for 04/13/13 with lab work on 3/9. 4. Diabetes mellitus. Stable. 5. New right bundle branch block. New since October 2014. Significance unclear. No cardiac symptoms. Troponin is negative. No further inpatient  evaluation suggested. 6. Obesity: BMI 38. Nutritional consult   Procedures:    Consultations:  none  Discharge Exam: Filed Vitals:   03/30/13 0509   BP: 127/70  Pulse: 76  Temp: 97.5 F (36.4 C)  Resp: 20    General: well nourished NAD Cardiovascular: RRR No MGR No LE edema Respiratory: normal effort BS clear bilaterally no wheeze no rhonchi Abdomen: obese soft +BS non-tender to palpation  Discharge Instructions     Medication List         colchicine 0.6 MG tablet  Take 0.6 mg by mouth 2 (two) times daily as needed. For GOUT PAIN     febuxostat 40 MG tablet  Commonly known as:  ULORIC  Take 80 mg by mouth daily.     HYDROcodone-acetaminophen 5-325 MG per tablet  Commonly known as:  NORCO/VICODIN  Take 1-2 tablets by mouth every 4 (four) hours as needed for moderate pain.     metoprolol 50 MG tablet  Commonly known as:  LOPRESSOR  Take 50 mg by mouth 2 (two) times daily.     ondansetron 4 MG tablet  Commonly known as:  ZOFRAN  Take 1 tablet (4 mg total) by mouth every 6 (six) hours as needed for nausea.       No Known Allergies Follow-up Information   Follow up with AVBUERE,EDWIN A, MD. Schedule an appointment as soon as possible for a visit in 1 week. (for evaluation of symptoms. )    Specialty:  Internal Medicine   Contact information:   [address] [place] [address] [phone]       Follow up with [facility], MD On 04/13/2013. (has appointment at 11:30am. go for lab work on 04/11/13 please. Dr. Rhona Leavens office calling in orders for lab work. )    Specialty:  Nephrology   Contact information:   [phone] [place] [address] [phone]        The results of significant diagnostics from this hospitalization (including imaging, microbiology, ancillary and laboratory) are listed below for reference.    Significant Diagnostic Studies: Dg Abd Acute W/chest  03/29/2013   CLINICAL DATA:  Abdominal pain.  EXAM: ACUTE ABDOMEN SERIES (ABDOMEN 2 VIEW & CHEST 1 VIEW)  COMPARISON:  02/03/2013 chest radiograph and 10/28/2012 and abdominal radiographs  FINDINGS: Mild  cardiomegaly again noted.  The lungs are clear.  Nondistended gas-filled loops of colon and small bowel are identified.  No dilated bowel loops or pneumoperitoneum noted.  No suspicious calcifications are present.  No acute bony abnormalities are identified.  IMPRESSION: Nonspecific nonobstructive bowel gas pattern - no evidence of pneumoperitoneum.  No evidence of acute cardiopulmonary disease.   Electronically Signed   By: Hassan Rowan M.D.   On: 03/29/2013 01:37    Microbiology: Recent Results (from the past 240 hour(s))  MRSA PCR SCREENING     Status: None   Collection Time    03/29/13  8:45 PM      Result Value Ref Range Status   MRSA by PCR NEGATIVE  NEGATIVE Final   Comment:            The GeneXpert MRSA Assay (FDA     approved for NASAL specimens     only), is one component of a     comprehensive MRSA colonization     surveillance program. It is not     intended to diagnose MRSA     infection nor to guide or     monitor  treatment for     MRSA infections.     Labs: Basic Metabolic Panel:  Recent Labs Lab 03/28/13 1434 03/28/13 1508 03/28/13 2036 03/29/13 0128 03/30/13 0616  NA 140 138 140 137 139  K 6.2* 6.3* 5.3 5.0 4.8  CL 111 103 104 104 106  CO2  --  21 22 21 22   GLUCOSE 89 86 82 90 82  BUN 64* 70* 67* 67* 63*  CREATININE 3.70* 3.55* 3.59* 3.69* 4.18*  CALCIUM  --  9.6 9.1 8.5 8.4   Liver Function Tests: No results found for this basename: AST, ALT, ALKPHOS, BILITOT, PROT, ALBUMIN,  in the last 168 hours No results found for this basename: LIPASE, AMYLASE,  in the last 168 hours No results found for this basename: AMMONIA,  in the last 168 hours CBC:  Recent Labs Lab 03/28/13 1434 03/28/13 1508 03/29/13 0128  WBC  --  12.7* 7.6  NEUTROABS  --  11.5*  --   HGB 16.7 14.3 13.1  HCT 49.0 45.0 41.0  MCV  --  84.3 84.2  PLT  --  201 209   Cardiac Enzymes:  Recent Labs Lab 03/28/13 2036 03/29/13 0128 03/29/13 0751  TROPONINI <0.30 <0.30 <0.30    BNP: BNP (last 3 results) No results found for this basename: PROBNP,  in the last 8760 hours CBG:  Recent Labs Lab 03/28/13 1702 03/29/13 1646 03/29/13 2008 03/30/13 0715  GLUCAP 76 76 85 93       Signed:  BLACK,KAREN M  [facility] 03/30/2013, 9:41 AM

## 2013-03-31 NOTE — Progress Notes (Signed)
UR chart review completed.  

## 2013-04-18 ENCOUNTER — Telehealth (HOSPITAL_COMMUNITY): Payer: Self-pay | Admitting: Dietician

## 2013-04-18 NOTE — Telephone Encounter (Signed)
Received referral via fax from Dr. Lowanda Foster for dx: obesity and renal insufficiency. Sent letter to pt home via [place] Mail in attempt to contact pt to schedule appointment.

## 2013-04-22 ENCOUNTER — Encounter (HOSPITAL_COMMUNITY): Payer: Self-pay | Admitting: Emergency Medicine

## 2013-04-22 ENCOUNTER — Emergency Department (HOSPITAL_COMMUNITY)
Admission: EM | Admit: 2013-04-22 | Discharge: 2013-04-22 | Disposition: A | Discharge status: Home / Self Care | Payer: Medicare Other | Attending: Emergency Medicine | Admitting: Emergency Medicine

## 2013-04-22 DIAGNOSIS — Z862 Personal history of diseases of the blood and blood-forming organs and certain disorders involving the immune mechanism: Secondary | ICD-10-CM | POA: Insufficient documentation

## 2013-04-22 DIAGNOSIS — I129 Hypertensive chronic kidney disease with stage 1 through stage 4 chronic kidney disease, or unspecified chronic kidney disease: Secondary | ICD-10-CM | POA: Insufficient documentation

## 2013-04-22 DIAGNOSIS — G8929 Other chronic pain: Secondary | ICD-10-CM | POA: Insufficient documentation

## 2013-04-22 DIAGNOSIS — N184 Chronic kidney disease, stage 4 (severe): Secondary | ICD-10-CM | POA: Insufficient documentation

## 2013-04-22 DIAGNOSIS — Z87448 Personal history of other diseases of urinary system: Secondary | ICD-10-CM | POA: Insufficient documentation

## 2013-04-22 DIAGNOSIS — E875 Hyperkalemia: Secondary | ICD-10-CM | POA: Insufficient documentation

## 2013-04-22 DIAGNOSIS — E1149 Type 2 diabetes mellitus with other diabetic neurological complication: Secondary | ICD-10-CM | POA: Insufficient documentation

## 2013-04-22 DIAGNOSIS — Z79899 Other long term (current) drug therapy: Secondary | ICD-10-CM | POA: Insufficient documentation

## 2013-04-22 DIAGNOSIS — M545 Low back pain, unspecified: Secondary | ICD-10-CM | POA: Insufficient documentation

## 2013-04-22 DIAGNOSIS — M549 Dorsalgia, unspecified: Secondary | ICD-10-CM

## 2013-04-22 DIAGNOSIS — Z872 Personal history of diseases of the skin and subcutaneous tissue: Secondary | ICD-10-CM | POA: Insufficient documentation

## 2013-04-22 DIAGNOSIS — Z8719 Personal history of other diseases of the digestive system: Secondary | ICD-10-CM | POA: Insufficient documentation

## 2013-04-22 DIAGNOSIS — Z86718 Personal history of other venous thrombosis and embolism: Secondary | ICD-10-CM | POA: Insufficient documentation

## 2013-04-22 DIAGNOSIS — E1142 Type 2 diabetes mellitus with diabetic polyneuropathy: Secondary | ICD-10-CM | POA: Insufficient documentation

## 2013-04-22 DIAGNOSIS — M109 Gout, unspecified: Secondary | ICD-10-CM | POA: Insufficient documentation

## 2013-04-22 LAB — CBC WITH DIFFERENTIAL/PLATELET
Basophils Absolute: 0 10*3/uL (ref 0.0–0.1)
Basophils Relative: 0 % (ref 0–1)
Eosinophils Absolute: 0.1 10*3/uL (ref 0.0–0.7)
Eosinophils Relative: 2 % (ref 0–5)
HCT: 42.8 % (ref 39.0–52.0)
Hemoglobin: 13.6 g/dL (ref 13.0–17.0)
Lymphocytes Relative: 23 % (ref 12–46)
Lymphs Abs: 1.8 10*3/uL (ref 0.7–4.0)
MCH: 26.7 pg (ref 26.0–34.0)
MCHC: 31.8 g/dL (ref 30.0–36.0)
MCV: 84.1 fL (ref 78.0–100.0)
Monocytes Absolute: 0.7 10*3/uL (ref 0.1–1.0)
Monocytes Relative: 10 % (ref 3–12)
Neutro Abs: 4.9 10*3/uL (ref 1.7–7.7)
Neutrophils Relative %: 66 % (ref 43–77)
Platelets: 176 10*3/uL (ref 150–400)
RBC: 5.09 MIL/uL (ref 4.22–5.81)
RDW: 16 % — ABNORMAL HIGH (ref 11.5–15.5)
WBC: 7.5 10*3/uL (ref 4.0–10.5)

## 2013-04-22 LAB — COMPREHENSIVE METABOLIC PANEL
ALBUMIN: 3.5 g/dL (ref 3.5–5.2)
ALK PHOS: 115 U/L (ref 39–117)
ALT: 14 U/L (ref 0–53)
AST: 30 U/L (ref 0–37)
BUN: 74 mg/dL — ABNORMAL HIGH (ref 6–23)
CALCIUM: 8.9 mg/dL (ref 8.4–10.5)
CO2: 20 mEq/L (ref 19–32)
Chloride: 107 mEq/L (ref 96–112)
Creatinine, Ser: 4.03 mg/dL — ABNORMAL HIGH (ref 0.50–1.35)
GFR calc Af Amer: 19 mL/min — ABNORMAL LOW (ref >90)
GFR calc non Af Amer: 17 mL/min — ABNORMAL LOW (ref >90)
GLUCOSE: 67 mg/dL — AB (ref 70–99)
Potassium: 5.6 mEq/L — ABNORMAL HIGH (ref 3.7–5.3)
SODIUM: 141 meq/L (ref 137–147)
Total Bilirubin: 0.8 mg/dL (ref 0.3–1.2)
Total Protein: 8 g/dL (ref 6.0–8.3)

## 2013-04-22 LAB — URINALYSIS, ROUTINE W REFLEX MICROSCOPIC
Bilirubin Urine: NEGATIVE
Glucose, UA: NEGATIVE mg/dL
Ketones, ur: NEGATIVE mg/dL
Leukocytes, UA: NEGATIVE
Nitrite: NEGATIVE
Protein, ur: 100 mg/dL — AB
Specific Gravity, Urine: 1.02 (ref 1.005–1.030)
Urobilinogen, UA: 0.2 mg/dL (ref 0.0–1.0)
pH: 6 (ref 5.0–8.0)

## 2013-04-22 LAB — URINE MICROSCOPIC-ADD ON

## 2013-04-22 MED ORDER — OXYCODONE-ACETAMINOPHEN 5-325 MG PO TABS: 1.0000 | ORAL_TABLET | Freq: Four times a day (QID) | ORAL | Status: DC | PRN

## 2013-04-22 MED ORDER — SODIUM POLYSTYRENE SULFONATE 15 GM/60ML PO SUSP
30.0000 g | Freq: Once | ORAL | Status: AC
  Administered 2013-04-22: 30 g via ORAL
  Filled 2013-04-22: qty 120

## 2013-04-22 MED ORDER — OXYCODONE-ACETAMINOPHEN 5-325 MG PO TABS
1.0000 | ORAL_TABLET | Freq: Once | ORAL | Status: AC
  Administered 2013-04-22: 1 via ORAL
  Filled 2013-04-22: qty 1

## 2013-04-22 NOTE — ED Provider Notes (Signed)
CSN: [id number]     Arrival date & time 04/22/13  1621 History   First MD Initiated Contact with Patient 04/22/13 1737     Chief Complaint  Patient presents with  . Back Pain     (Consider location/radiation/quality/duration/timing/severity/associated sxs/prior Treatment) Patient is a 48 y.o. male presenting with back pain. The history is provided by the patient (the pt complains of low back pain).  Back Pain Location:  Lumbar spine Quality:  Aching Radiates to:  Does not radiate Pain severity:  Moderate Onset quality:  Gradual Timing:  Constant Associated symptoms: no abdominal pain, no chest pain and no headaches     Past Medical History  Diagnosis Date  . Hypertension   . Diabetes mellitus   . Gout   . Chronic back pain   . BPH (benign prostatic hyperplasia)   . Anemia   . DVT (deep venous thrombosis)   . Neuropathy   . Decubitus ulcer     of 2nd toes of both feet.  . Edema leg   . Constipation   . Venous (peripheral) insufficiency   . Neuropathy, diabetic   . Physical deconditioning   . Poor balance   . Difficulty walking   . Lack of coordination   . Arthritis   . Renal insufficiency   . Chronic kidney disease   . Chronic kidney disease (CKD), stage IV (severe)    Past Surgical History  Procedure Laterality Date  . None     Family History  Problem Relation Age of Onset  . Diabetes Mother   . Hypertension Mother   . Heart failure Mother   . Hyperlipidemia Mother   . Cancer Father   . Diabetes Father   . Hypertension Father   . Hyperlipidemia Father    History  Substance Use Topics  . Smoking status: Never Smoker   . Smokeless tobacco: Never Used  . Alcohol Use: No    Review of Systems  Constitutional: Negative for appetite change and fatigue.  HENT: Negative for congestion, ear discharge and sinus pressure.   Eyes: Negative for discharge.  Respiratory: Negative for cough.   Cardiovascular: Negative for chest pain.  Gastrointestinal:  Negative for abdominal pain and diarrhea.  Genitourinary: Negative for frequency and hematuria.  Musculoskeletal: Positive for back pain.  Skin: Negative for rash.  Neurological: Negative for seizures and headaches.  Psychiatric/Behavioral: Negative for hallucinations.      Allergies  Review of patient's allergies indicates no known allergies.  Home Medications   Current Outpatient Rx  Name  Route  Sig  Dispense  Refill  . colchicine 0.6 MG tablet   Oral   Take 0.6 mg by mouth 2 (two) times daily as needed. For GOUT PAIN         . febuxostat (ULORIC) 40 MG tablet   Oral   Take 80 mg by mouth daily.         [name] HYDROcodone-acetaminophen (NORCO/VICODIN) 5-325 MG per tablet   Oral   Take 1-2 tablets by mouth every 4 (four) hours as needed for moderate pain.         . metoprolol (LOPRESSOR) 50 MG tablet   Oral   Take 50 mg by mouth 2 (two) times daily.         . ondansetron (ZOFRAN) 4 MG tablet   Oral   Take 1 tablet (4 mg total) by mouth every 6 (six) hours as needed for nausea.   20 tablet   0   .  oxyCODONE-acetaminophen (PERCOCET/ROXICET) 5-325 MG per tablet   Oral   Take 1 tablet by mouth every 6 (six) hours as needed for severe pain.   15 tablet   0    BP 137/92  Pulse 57  Temp(Src) 97.9 F (36.6 C)  Resp 22  Ht 5\' 11"  (1.803 m)  Wt 271 lb (122.925 kg)  BMI 37.81 kg/m2  SpO2 100% Physical Exam  Constitutional: He is oriented to person, place, and time. He appears well-developed.  HENT:  Head: Normocephalic.  Eyes: Conjunctivae and EOM are normal. No scleral icterus.  Neck: Neck supple. No thyromegaly present.  Cardiovascular: Normal rate and regular rhythm.  Exam reveals no gallop and no friction rub.   No murmur heard. Pulmonary/Chest: No stridor. He has no wheezes. He has no rales. He exhibits no tenderness.  Abdominal: He exhibits no distension. There is no tenderness. There is no rebound.  Musculoskeletal: Normal range of motion. He exhibits  no edema.  Tender lumbar spine  Lymphadenopathy:    He has no cervical adenopathy.  Neurological: He is oriented to person, place, and time. He exhibits normal muscle tone. Coordination normal.  Skin: No rash noted. No erythema.  Psychiatric: He has a normal mood and affect. His behavior is normal.    ED Course  Procedures (including critical care time) Labs Review Labs Reviewed  CBC WITH DIFFERENTIAL - Abnormal; Notable for the following:    RDW 16.0 (*)    All other components within normal limits  COMPREHENSIVE METABOLIC PANEL - Abnormal; Notable for the following:    Potassium 5.6 (*)    Glucose, Bld 67 (*)    BUN 74 (*)    Creatinine, Ser 4.03 (*)    GFR calc non Af Amer 17 (*)    GFR calc Af Amer 19 (*)    All other components within normal limits  URINALYSIS, ROUTINE W REFLEX MICROSCOPIC - Abnormal; Notable for the following:    Hgb urine dipstick SMALL (*)    Protein, ur 100 (*)    All other components within normal limits  URINE MICROSCOPIC-ADD ON   Imaging Review No results found.   EKG Interpretation None      MDM   Final diagnoses:  Back pain  Hyperkalemia        Maudry Diego, MD 04/22/13 [phone]

## 2013-04-22 NOTE — ED Notes (Addendum)
Pt c/o lower back pain x 2-3 hours.

## 2013-04-22 NOTE — Discharge Instructions (Signed)
Follow up with your md next week. °

## 2013-04-28 NOTE — Telephone Encounter (Signed)
Sent letter to pt home via [place] Mail in attempt to contact pt to schedule appointment.  

## 2013-05-02 NOTE — Telephone Encounter (Signed)
Received voicemail left at 1222. Called back at 1651; phone rang multiple times, unable to leave voicemail.

## 2013-05-03 NOTE — Telephone Encounter (Signed)
Pt has not responded to attempts to contact to schedule appointment. Referral filed.  

## 2013-05-22 IMAGING — CR DG CHEST 1V PORT
1 series · 1 of 1 positions shown · non-contrast
Comparison: 05/14/2011

CLINICAL DATA: Chest pain

PORTABLE CHEST - 1 VIEW

[view not recorded]
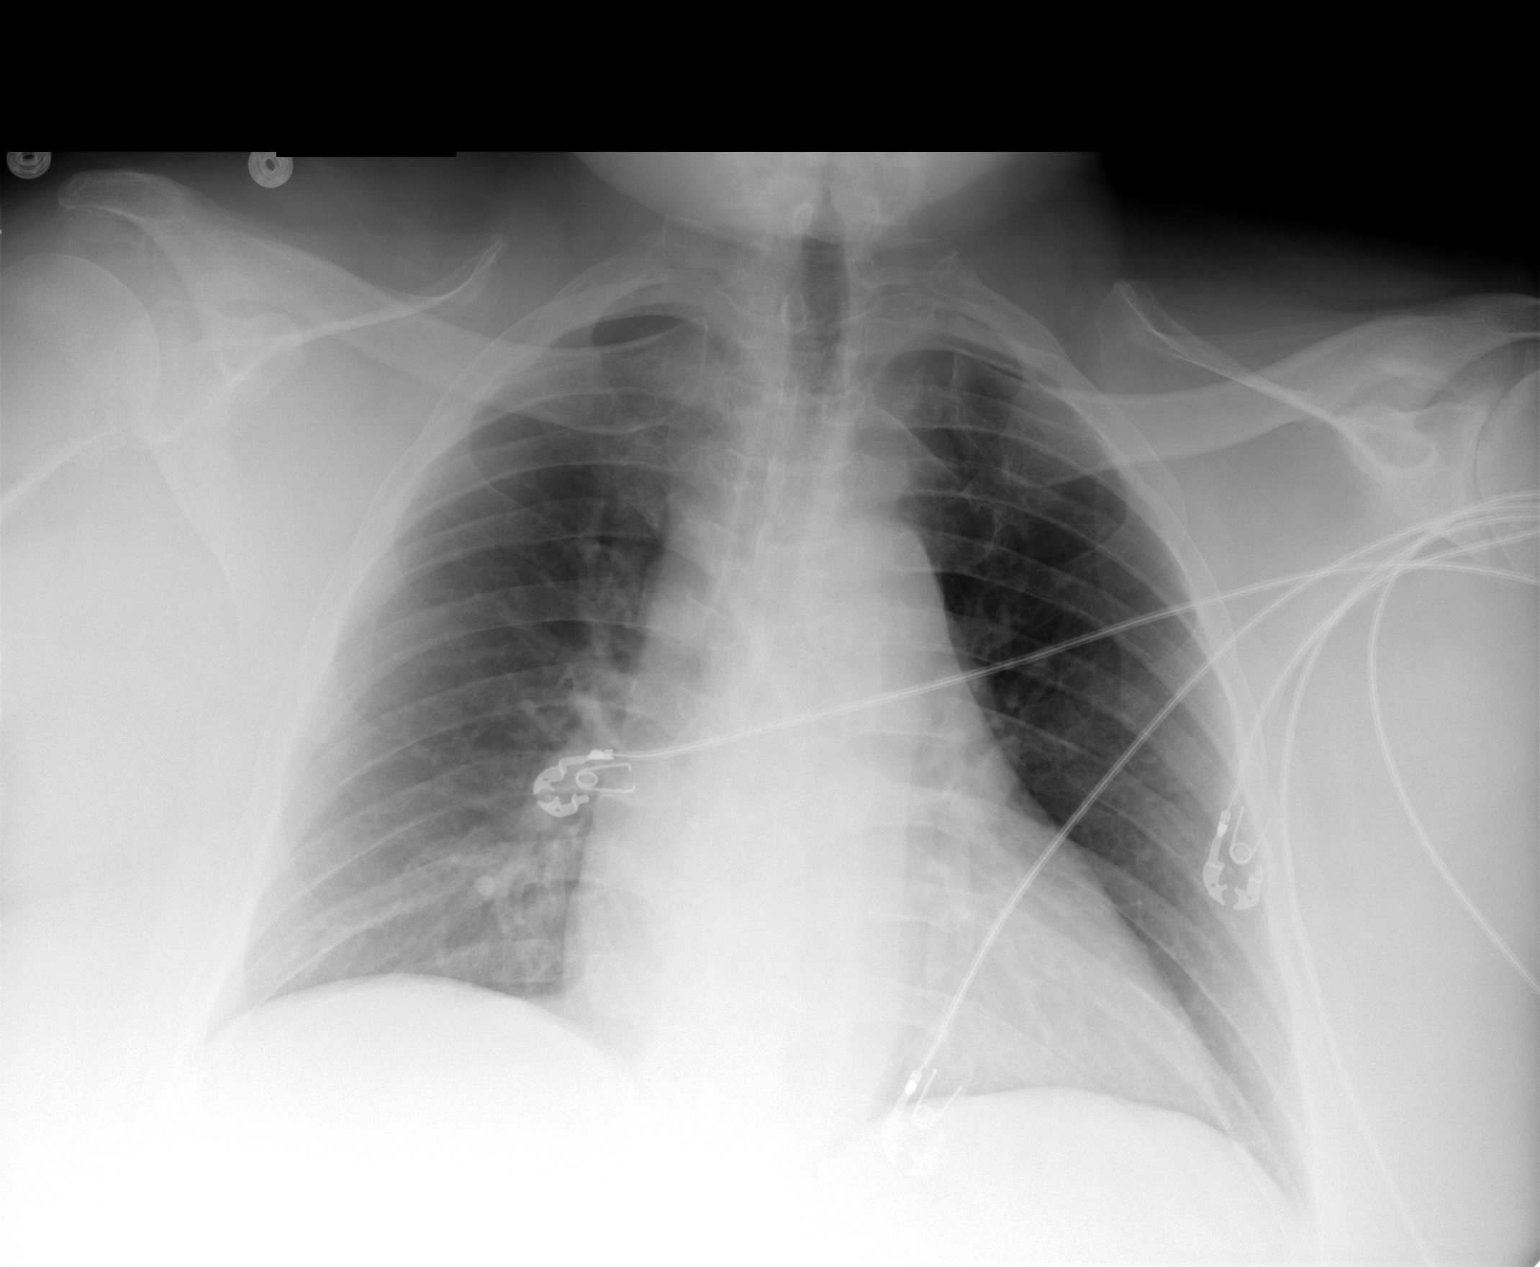

[1 of 1 positions shown; findings below may reference images not displayed]

FINDINGS: 8522 hours.  Lung volumes are low. The cardiopericardial
silhouette is enlarged. The lungs are clear without focal
infiltrate, edema, pneumothorax or pleural effusion. Imaged bony
structures of the thorax are intact. Telemetry leads overlie the
chest.
IMPRESSION: Low volume film with enlargement of the cardiopericardial
silhouette.

## 2013-06-04 IMAGING — CR DG CHEST 1V
1 series · 1 of 1 positions shown · non-contrast
Comparison: 11/19/2011

CLINICAL DATA: Chest pain

CHEST - 1 VIEW

[view not recorded]
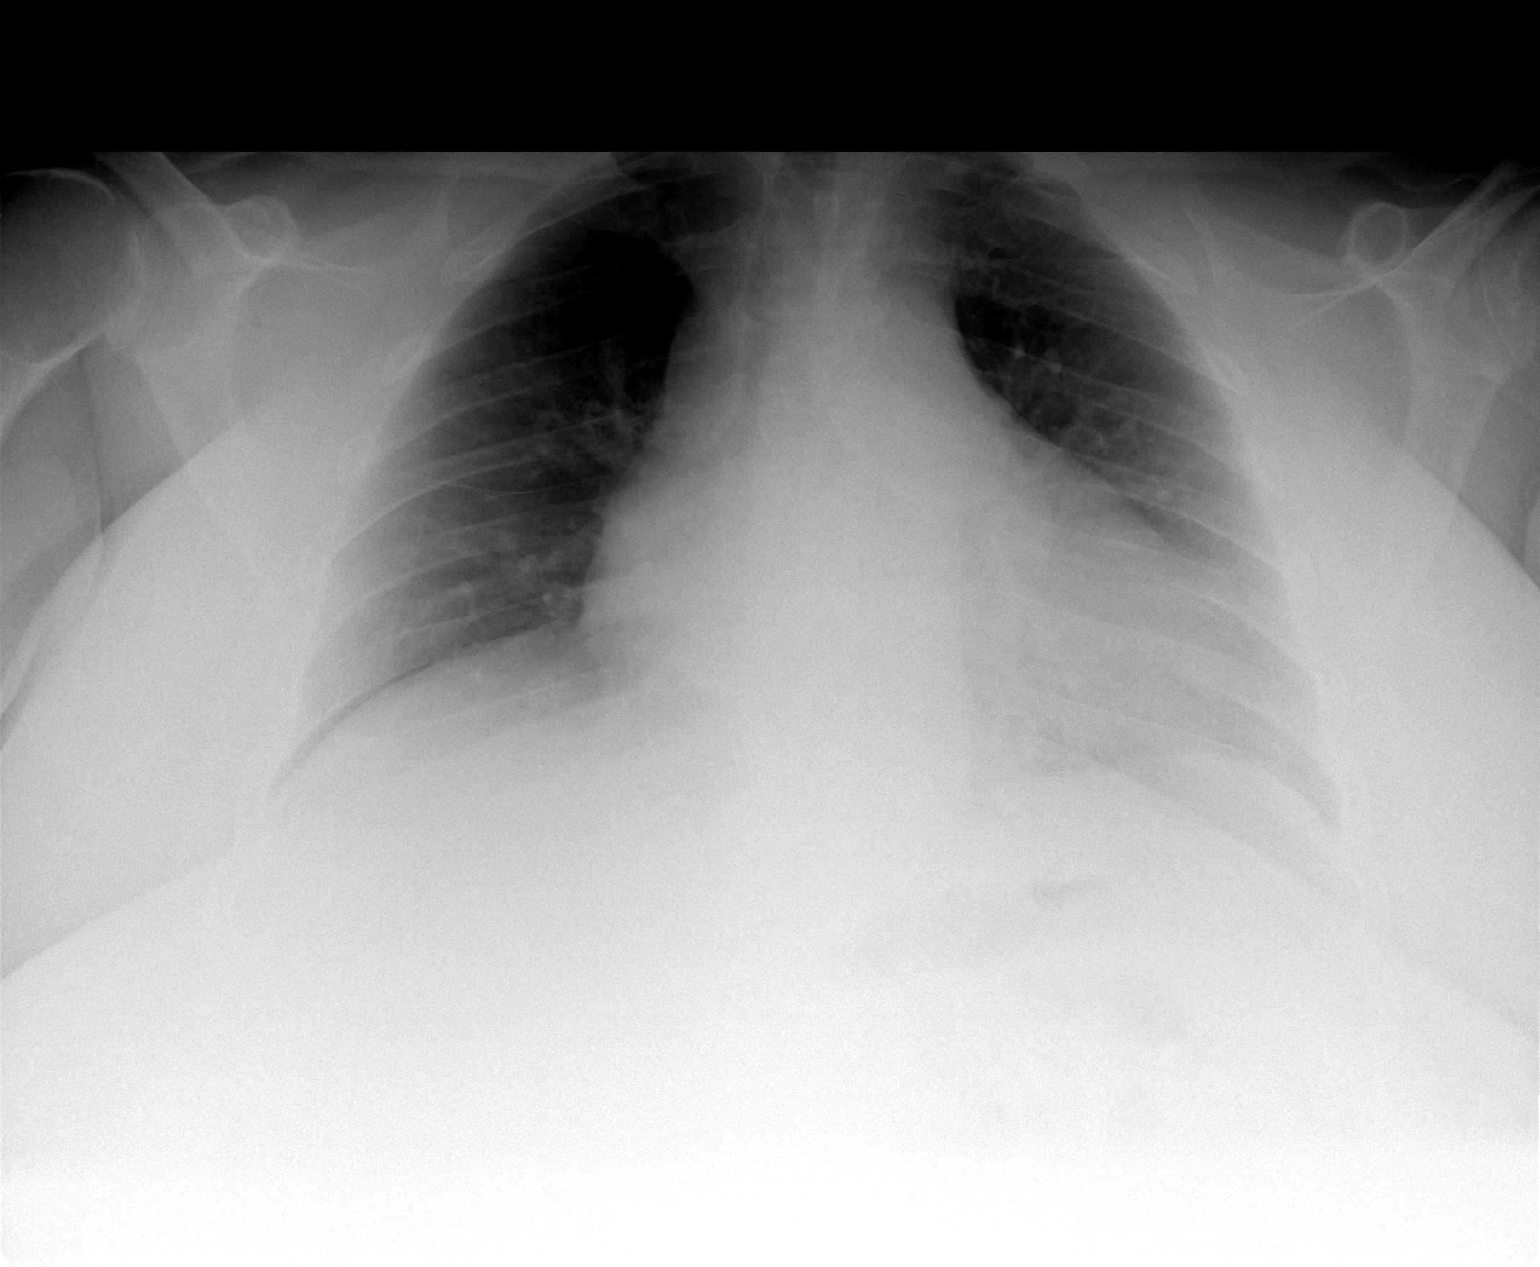

[1 of 1 positions shown; findings below may reference images not displayed]

FINDINGS: Stable cardiomegaly.  Lungs clear.  No effusion.
Relatively low lung volumes.  Regional bones grossly unremarkable.
Obesity.
IMPRESSION: 1.  Stable cardiomegaly.

## 2013-06-10 ENCOUNTER — Emergency Department (HOSPITAL_COMMUNITY): Payer: Medicare Other

## 2013-06-10 ENCOUNTER — Emergency Department (HOSPITAL_COMMUNITY)
Admission: EM | Admit: 2013-06-10 | Discharge: 2013-06-11 | Disposition: A | Discharge status: Home / Self Care | Payer: Medicare Other | Attending: Emergency Medicine | Admitting: Emergency Medicine

## 2013-06-10 ENCOUNTER — Encounter (HOSPITAL_COMMUNITY): Payer: Self-pay | Admitting: Emergency Medicine

## 2013-06-10 DIAGNOSIS — I129 Hypertensive chronic kidney disease with stage 1 through stage 4 chronic kidney disease, or unspecified chronic kidney disease: Secondary | ICD-10-CM | POA: Insufficient documentation

## 2013-06-10 DIAGNOSIS — M79673 Pain in unspecified foot: Secondary | ICD-10-CM

## 2013-06-10 DIAGNOSIS — R609 Edema, unspecified: Secondary | ICD-10-CM | POA: Insufficient documentation

## 2013-06-10 DIAGNOSIS — G8929 Other chronic pain: Secondary | ICD-10-CM | POA: Insufficient documentation

## 2013-06-10 DIAGNOSIS — M25476 Effusion, unspecified foot: Secondary | ICD-10-CM | POA: Insufficient documentation

## 2013-06-10 DIAGNOSIS — M25473 Effusion, unspecified ankle: Secondary | ICD-10-CM | POA: Insufficient documentation

## 2013-06-10 DIAGNOSIS — E1149 Type 2 diabetes mellitus with other diabetic neurological complication: Secondary | ICD-10-CM | POA: Insufficient documentation

## 2013-06-10 DIAGNOSIS — Z79899 Other long term (current) drug therapy: Secondary | ICD-10-CM | POA: Insufficient documentation

## 2013-06-10 DIAGNOSIS — M79609 Pain in unspecified limb: Secondary | ICD-10-CM | POA: Insufficient documentation

## 2013-06-10 DIAGNOSIS — Z872 Personal history of diseases of the skin and subcutaneous tissue: Secondary | ICD-10-CM | POA: Insufficient documentation

## 2013-06-10 DIAGNOSIS — N184 Chronic kidney disease, stage 4 (severe): Secondary | ICD-10-CM | POA: Insufficient documentation

## 2013-06-10 DIAGNOSIS — M129 Arthropathy, unspecified: Secondary | ICD-10-CM | POA: Insufficient documentation

## 2013-06-10 DIAGNOSIS — E1142 Type 2 diabetes mellitus with diabetic polyneuropathy: Secondary | ICD-10-CM | POA: Insufficient documentation

## 2013-06-10 DIAGNOSIS — M109 Gout, unspecified: Secondary | ICD-10-CM | POA: Insufficient documentation

## 2013-06-10 DIAGNOSIS — Z86718 Personal history of other venous thrombosis and embolism: Secondary | ICD-10-CM | POA: Insufficient documentation

## 2013-06-10 LAB — CBC WITH DIFFERENTIAL/PLATELET
BASOS PCT: 0 % (ref 0–1)
Basophils Absolute: 0 10*3/uL (ref 0.0–0.1)
Eosinophils Absolute: 0.2 10*3/uL (ref 0.0–0.7)
Eosinophils Relative: 2 % (ref 0–5)
HCT: 38.5 % — ABNORMAL LOW (ref 39.0–52.0)
Hemoglobin: 12.6 g/dL — ABNORMAL LOW (ref 13.0–17.0)
Lymphocytes Relative: 28 % (ref 12–46)
Lymphs Abs: 2.7 10*3/uL (ref 0.7–4.0)
MCH: 27 pg (ref 26.0–34.0)
MCHC: 32.7 g/dL (ref 30.0–36.0)
MCV: 82.6 fL (ref 78.0–100.0)
Monocytes Absolute: 0.8 10*3/uL (ref 0.1–1.0)
Monocytes Relative: 8 % (ref 3–12)
NEUTROS ABS: 6.1 10*3/uL (ref 1.7–7.7)
NEUTROS PCT: 62 % (ref 43–77)
PLATELETS: 218 10*3/uL (ref 150–400)
RBC: 4.66 MIL/uL (ref 4.22–5.81)
RDW: 16.8 % — ABNORMAL HIGH (ref 11.5–15.5)
WBC: 9.9 10*3/uL (ref 4.0–10.5)

## 2013-06-10 MED ORDER — HYDROCODONE-ACETAMINOPHEN 5-325 MG PO TABS: 1.0000 | ORAL_TABLET | Freq: Four times a day (QID) | ORAL | Status: DC | PRN

## 2013-06-10 MED ORDER — CEPHALEXIN 500 MG PO CAPS: 500.0000 mg | ORAL_CAPSULE | Freq: Four times a day (QID) | ORAL | Status: DC

## 2013-06-10 NOTE — ED Provider Notes (Signed)
CSN: [id number]     Arrival date & time 06/10/13  1938 History   First MD Initiated Contact with Patient 06/10/13 2237    This chart was scribed for Maudry Diego, MD by Terressa Koyanagi, ED Scribe. This patient was seen in room APA11/APA11 and the patient's care was started at 10:39 PM.  Chief Complaint  Patient presents with  . Foot Pain   Patient is a 48 y.o. male presenting with lower extremity pain. The history is provided by the patient. No language interpreter was used.  Foot Pain This is a new problem. The current episode started 2 days ago. The problem occurs constantly. The problem has not changed since onset.Pertinent negatives include no chest pain, no abdominal pain and no headaches. Nothing aggravates the symptoms. Nothing relieves the symptoms. He has tried nothing for the symptoms.   HPI Comments: Jack Irwin is a 48 y.o. male who presents to the Emergency Department complaining of right toe pain and associated swelling of the ankles, bilaterally.    Past Medical History  Diagnosis Date  . Hypertension   . Diabetes mellitus   . Gout   . Chronic back pain   . BPH (benign prostatic hyperplasia)   . Anemia   . DVT (deep venous thrombosis)   . Neuropathy   . Decubitus ulcer     of 2nd toes of both feet.  . Edema leg   . Constipation   . Venous (peripheral) insufficiency   . Neuropathy, diabetic   . Physical deconditioning   . Poor balance   . Difficulty walking   . Lack of coordination   . Arthritis   . Renal insufficiency   . Chronic kidney disease   . Chronic kidney disease (CKD), stage IV (severe)    Past Surgical History  Procedure Laterality Date  . None     Family History  Problem Relation Age of Onset  . Diabetes Mother   . Hypertension Mother   . Heart failure Mother   . Hyperlipidemia Mother   . Cancer Father   . Diabetes Father   . Hypertension Father   . Hyperlipidemia Father    History  Substance Use Topics  . Smoking status: Never  Smoker   . Smokeless tobacco: Never Used  . Alcohol Use: No    Review of Systems  Constitutional: Negative for appetite change and fatigue.  HENT: Negative for congestion, ear discharge and sinus pressure.   Eyes: Negative for discharge.  Respiratory: Negative for cough.   Cardiovascular: Negative for chest pain.  Gastrointestinal: Negative for abdominal pain and diarrhea.  Genitourinary: Negative for frequency and hematuria.  Musculoskeletal: Negative for back pain.       Swelling of both ankles; right toe pain  Skin: Negative for rash.       Discoloration of all toes, bilaterally   Neurological: Negative for seizures and headaches.  Psychiatric/Behavioral: Negative for hallucinations.   Allergies  Review of patient's allergies indicates no known allergies.  Home Medications   Prior to Admission medications   Medication Sig Start Date End Date Taking? Authorizing Provider  colchicine 0.6 MG tablet Take 0.6 mg by mouth 2 (two) times daily as needed. For GOUT PAIN    Historical Provider, MD  febuxostat (ULORIC) 40 MG tablet Take 80 mg by mouth daily.    Historical Provider, MD  HYDROcodone-acetaminophen (NORCO/VICODIN) 5-325 MG per tablet Take 1-2 tablets by mouth every 4 (four) hours as needed for moderate pain.  Historical Provider, MD  metoprolol (LOPRESSOR) 50 MG tablet Take 50 mg by mouth 2 (two) times daily. 11/02/12   Sharyon Cable, MD  ondansetron (ZOFRAN) 4 MG tablet Take 1 tablet (4 mg total) by mouth every 6 (six) hours as needed for nausea. 03/30/13   Radene Gunning, NP  oxyCODONE-acetaminophen (PERCOCET/ROXICET) 5-325 MG per tablet Take 1 tablet by mouth every 6 (six) hours as needed for severe pain. 04/22/13   Maudry Diego, MD   Triage Vitals: BP 157/107  Pulse 92  Temp(Src) 98.1 F (36.7 C) (Oral)  Resp 20  Ht 5\' 11"  (1.803 m)  Wt 270 lb (122.471 kg)  BMI 37.67 kg/m2  SpO2 96%  Physical Exam  Constitutional: He is oriented to person, place, and time.  He appears well-developed.  HENT:  Head: Normocephalic.  Eyes: Conjunctivae are normal.  Neck: No tracheal deviation present.  Cardiovascular:  No murmur heard. Musculoskeletal: Normal range of motion. He exhibits edema (1+ edema both ankles ).  Black discoloration of all toes, bilaterally.   Neurological: He is oriented to person, place, and time.  Skin: Skin is warm.  Psychiatric: He has a normal mood and affect.   ED Course  Procedures (including critical care time)  DIAGNOSTIC STUDIES: Oxygen Saturation is 96% on RA, adequate by my interpretation.    COORDINATION OF CARE:  10:41 PM-Discussed treatment plan which includes labs with pt at bedside and pt agreed to plan.   Labs Review Labs Reviewed - No data to display  Imaging Review No results found.   EKG Interpretation None      MDM   Final diagnoses:  None    The chart was scribed for me under my direct supervision.  I personally performed the history, physical, and medical decision making and all procedures in the evaluation of this patient.Maudry Diego, MD 06/11/13 1524

## 2013-06-10 NOTE — Discharge Instructions (Signed)
Follow up with Dr. Berline Lopes next week.

## 2013-06-10 NOTE — ED Notes (Signed)
bil foot pain. For 2 weeks,  Lt ankle swelling

## 2013-06-11 LAB — BASIC METABOLIC PANEL
BUN: 67 mg/dL — AB (ref 6–23)
CO2: 21 mEq/L (ref 19–32)
Calcium: 8.7 mg/dL (ref 8.4–10.5)
Chloride: 102 mEq/L (ref 96–112)
Creatinine, Ser: 4.3 mg/dL — ABNORMAL HIGH (ref 0.50–1.35)
GFR, EST AFRICAN AMERICAN: 18 mL/min — AB (ref >90)
GFR, EST NON AFRICAN AMERICAN: 15 mL/min — AB (ref >90)
Glucose, Bld: 96 mg/dL (ref 70–99)
POTASSIUM: 4.8 meq/L (ref 3.7–5.3)
SODIUM: 136 meq/L — AB (ref 137–147)

## 2013-06-14 ENCOUNTER — Ambulatory Visit (INDEPENDENT_AMBULATORY_CARE_PROVIDER_SITE_OTHER): Payer: Medicare Other | Admitting: Podiatry

## 2013-06-14 ENCOUNTER — Encounter: Payer: Self-pay | Admitting: Podiatry

## 2013-06-14 VITALS — BP 159/80 | HR 52 | Resp 16

## 2013-06-14 DIAGNOSIS — M79609 Pain in unspecified limb: Secondary | ICD-10-CM

## 2013-06-14 DIAGNOSIS — E1149 Type 2 diabetes mellitus with other diabetic neurological complication: Secondary | ICD-10-CM

## 2013-06-14 DIAGNOSIS — M2040 Other hammer toe(s) (acquired), unspecified foot: Secondary | ICD-10-CM

## 2013-06-14 DIAGNOSIS — B351 Tinea unguium: Secondary | ICD-10-CM

## 2013-06-14 MED ORDER — CLOTRIMAZOLE 1 % EX CREA: 1.0000 "application " | TOPICAL_CREAM | Freq: Two times a day (BID) | CUTANEOUS | Status: DC

## 2013-06-14 NOTE — Progress Notes (Signed)
He presents today after having visited the ER and is now taking antibiotics for cellulitis to the right foot. He states he has burning and tingling in his right foot as well as the left foot. And he also like to see about getting diabetic shoes.   Objective: Vital signs are stable he is alert and oriented x3 pulses are barely palpable bilateral. Neurologic sensorium is diminished per since once the monofilament. Deep tendon reflexes are in non-elicitable bilateral. Muscle strength is 5 over 5 dorsiflexors plantar flexors inverters everters all intrinsic musculature is intact. Orthopedic evaluation demonstrates all joints distal to the ankle a full range of motion without crepitation. He has cutaneous evaluation which demonstrates cellulitis to the second digit of the right foot with grossly elongated toenails that are cutting into the skin. This is more than likely with cellulitis coming from. He also has hammertoe deformities bilateral.  Assessment: Pain in limb secondary to grossly elongated toenails the cellulitis with diabetic peripheral neuropathy angiopathy and hammertoe deformities.  Plan: Size today for diabetic shoes he was also encouraged to continue his antibiotics I debrided nails 1 through 5 bilateral is cover service secondary to pain also wrote her prescription for Lotrimin cream and I will followup with him in 3 months.

## 2013-06-14 NOTE — Patient Instructions (Signed)
Diabetes and Foot Care Diabetes may cause you to have problems because of poor blood supply (circulation) to your feet and legs. This may cause the skin on your feet to become thinner, break easier, and heal more slowly. Your skin may become dry, and the skin may peel and crack. You may also have nerve damage in your legs and feet causing decreased feeling in them. You may not notice minor injuries to your feet that could lead to infections or more serious problems. Taking care of your feet is one of the most important things you can do for yourself.  HOME CARE INSTRUCTIONS  Wear shoes at all times, even in the house. Do not go barefoot. Bare feet are easily injured.  Check your feet daily for blisters, cuts, and redness. If you cannot see the bottom of your feet, use a mirror or ask someone for help.  Wash your feet with warm water (do not use hot water) and mild soap. Then pat your feet and the areas between your toes until they are completely dry. Do not soak your feet as this can dry your skin.  Apply a moisturizing lotion or petroleum jelly (that does not contain alcohol and is unscented) to the skin on your feet and to dry, brittle toenails. Do not apply lotion between your toes.  Trim your toenails straight across. Do not dig under them or around the cuticle. File the edges of your nails with an emery board or nail file.  Do not cut corns or calluses or try to remove them with medicine.  Wear clean socks or stockings every day. Make sure they are not too tight. Do not wear knee-high stockings since they may decrease blood flow to your legs.  Wear shoes that fit properly and have enough cushioning. To break in new shoes, wear them for just a few hours a day. This prevents you from injuring your feet. Always look in your shoes before you put them on to be sure there are no objects inside.  Do not cross your legs. This may decrease the blood flow to your feet.  If you find a minor scrape,  cut, or break in the skin on your feet, keep it and the skin around it clean and dry. These areas may be cleansed with mild soap and water. Do not cleanse the area with peroxide, alcohol, or iodine.  When you remove an adhesive bandage, be sure not to damage the skin around it.  If you have a wound, look at it several times a day to make sure it is healing.  Do not use heating pads or hot water bottles. They may burn your skin. If you have lost feeling in your feet or legs, you may not know it is happening until it is too late.  Make sure your health care provider performs a complete foot exam at least annually or more often if you have foot problems. Report any cuts, sores, or bruises to your health care provider immediately. SEEK MEDICAL CARE IF:   You have an injury that is not healing.  You have cuts or breaks in the skin.  You have an ingrown nail.  You notice redness on your legs or feet.  You feel burning or tingling in your legs or feet.  You have pain or cramps in your legs and feet.  Your legs or feet are numb.  Your feet always feel cold. SEEK IMMEDIATE MEDICAL CARE IF:   There is increasing redness,   swelling, or pain in or around a wound.  There is a red line that goes up your leg.  Pus is coming from a wound.  You develop a fever or as directed by your health care provider.  You notice a bad smell coming from an ulcer or wound. Document Released: 01/18/2000 Document Revised: 09/22/2012 Document Reviewed: 06/29/2012 [employer] Patient Information 2014 [employer].  

## 2013-06-20 ENCOUNTER — Encounter (HOSPITAL_COMMUNITY): Payer: Self-pay | Admitting: Dietician

## 2013-06-20 NOTE — Progress Notes (Signed)
Outpatient Initial Nutrition Assessment  Date:06/20/2013   Appt Start Time: 29  Referring Physician: Dr. Lowanda Foster Reason for Visit: obesity, renal insufficiency  Nutrition Assessment:  Height: 5\' 11"  (180.3 cm)   Weight: 268 lb (121.564 kg)   IBW: 172#  %IBW: 156% UBW: 300#  %UBW: 112% Body mass index is 37.39 kg/(m^2). Meets criteria for obesity, class II.  Goal Weight: 241# (10% loss of current wt) Weight hx: Wt Readings from Last 10 Encounters:  06/20/13 268 lb (121.564 kg)  06/10/13 270 lb (122.471 kg)  04/22/13 271 lb (122.925 kg)  03/30/13 265 lb 4.8 oz (120.339 kg)  02/03/13 270 lb (122.471 kg)  12/20/12 272 lb (123.378 kg)  12/17/12 272 lb (123.378 kg)  11/29/12 274 lb (124.286 kg)  11/02/12 278 lb (126.1 kg)  10/28/12 278 lb 9 oz (126.355 kg)    Estimated nutritional needs:  Kcals/ day: 2100-2500 Protein (grams)/day: 50-67 Fluid (L)/ day: 2.1-2.5  PMH:  Past Medical History  Diagnosis Date  . Hypertension   . Diabetes mellitus   . Gout   . Chronic back pain   . BPH (benign prostatic hyperplasia)   . Anemia   . DVT (deep venous thrombosis)   . Neuropathy   . Decubitus ulcer     of 2nd toes of both feet.  . Edema leg   . Constipation   . Venous (peripheral) insufficiency   . Neuropathy, diabetic   . Physical deconditioning   . Poor balance   . Difficulty walking   . Lack of coordination   . Arthritis   . Renal insufficiency   . Chronic kidney disease   . Chronic kidney disease (CKD), stage IV (severe)     Medications:  Current Outpatient Rx  Name  Route  Sig  Dispense  Refill  . cephALEXin (KEFLEX) 500 MG capsule   Oral   Take 1 capsule (500 mg total) by mouth 4 (four) times daily.   28 capsule   0   . clotrimazole (LOTRIMIN) 1 % cream   Topical   Apply 1 application topically 2 (two) times daily. Apply to the affected area twice daily.   60 g   11   . colchicine 0.6 MG tablet   Oral   Take 0.6 mg by mouth 2 (two) times daily as  needed. For GOUT PAIN         . febuxostat (ULORIC) 40 MG tablet   Oral   Take 80 mg by mouth daily.         [name] HYDROcodone-acetaminophen (NORCO/VICODIN) 5-325 MG per tablet   Oral   Take 1-2 tablets by mouth every 4 (four) hours as needed for moderate pain.         [name] HYDROcodone-acetaminophen (NORCO/VICODIN) 5-325 MG per tablet   Oral   Take 1 tablet by mouth every 6 (six) hours as needed for moderate pain.   20 tablet   0   . metoprolol (LOPRESSOR) 50 MG tablet   Oral   Take 50 mg by mouth 2 (two) times daily.         . ondansetron (ZOFRAN) 4 MG tablet   Oral   Take 1 tablet (4 mg total) by mouth every 6 (six) hours as needed for nausea.   20 tablet   0   . oxyCODONE-acetaminophen (PERCOCET/ROXICET) 5-325 MG per tablet   Oral   Take 1 tablet by mouth every 6 (six) hours as needed for severe pain.   15 tablet  0     Labs: CMP     Component Value Date/Time   NA 136* 06/10/2013 2318   K 4.8 06/10/2013 2318   CL 102 06/10/2013 2318   CO2 21 06/10/2013 2318   GLUCOSE 96 06/10/2013 2318   BUN 67* 06/10/2013 2318   CREATININE 4.30* 06/10/2013 2318   CALCIUM 8.7 06/10/2013 2318   CALCIUM 10.0 09/12/2009 0528   PROT 8.0 04/22/2013 1801   ALBUMIN 3.5 04/22/2013 1801   AST 30 04/22/2013 1801   ALT 14 04/22/2013 1801   ALKPHOS 115 04/22/2013 1801   BILITOT 0.8 04/22/2013 1801   GFRNONAA 15* 06/10/2013 2318   GFRAA 18* 06/10/2013 2318    Lipid Panel     Component Value Date/Time   CHOL  Value: 130        ATP III CLASSIFICATION:  <200     mg/dL   Desirable  200-239  mg/dL   Borderline High  >=240    mg/dL   High        09/12/2009 0527   TRIG 90 09/12/2009 0527   HDL 36* 09/12/2009 0527   CHOLHDL 3.6 09/12/2009 0527   VLDL 18 09/12/2009 0527   LDLCALC  Value: 76        Total Cholesterol/HDL:CHD Risk Coronary Heart Disease Risk Table                     Men   Women  1/2 Average Risk   3.4   3.3  Average Risk       5.0   4.4  2 X Average Risk   9.6   7.1  3 X Average Risk  23.4   11.0         Use the calculated Patient Ratio above and the CHD Risk Table to determine the patient's CHD Risk.        ATP III CLASSIFICATION (LDL):  <100     mg/dL   Optimal  100-129  mg/dL   Near or Above                    Optimal  130-159  mg/dL   Borderline  160-189  mg/dL   High  >190     mg/dL   Very High 09/12/2009 0527     Lab Results  Component Value Date   HGBA1C  Value: 5.9 (NOTE)                                                                       According to the ADA Clinical Practice Recommendations for 2011, when HbA1c is used as a screening test:   >=6.5%   Diagnostic of Diabetes Mellitus           (if abnormal result  is confirmed)  5.7-6.4%   Increased risk of developing Diabetes Mellitus  References:Diagnosis and Classification of Diabetes Mellitus,Diabetes Care,2011,34(Suppl 1):S62-S69 and Standards of Medical Care in         Diabetes - 2011,Diabetes [id number]  (Suppl 1):S11-S61.* 11/06/2009   HGBA1C  Value: 6.7 (NOTE) The ADA recommends the following therapeutic goal for glycemic control related to Hgb A1c measurement: Goal of therapy: <6.5 Hgb A1c  Reference: American Diabetes Association: Clinical Practice  Recommendations 2010, Diabetes Care, 2010, 33: (Suppl  1).* 02/19/2009   HGBA1C  Value: 6.2 (NOTE) The ADA recommends the following therapeutic goal for glycemic control related to Hgb A1c measurement: Goal of therapy: <6.5 Hgb A1c  Reference: American Diabetes Association: Clinical Practice Recommendations 2010, Diabetes Care, 2010, 33: (Suppl  1).* 06/22/2008   Lab Results  Component Value Date   LDLCALC  Value: 76        Total Cholesterol/HDL:CHD Risk Coronary Heart Disease Risk Table                     Men   Women  1/2 Average Risk   3.4   3.3  Average Risk       5.0   4.4  2 X Average Risk   9.6   7.1  3 X Average Risk  23.4   11.0        Use the calculated Patient Ratio above and the CHD Risk Table to determine the patient's CHD Risk.        ATP III CLASSIFICATION (LDL):  <100      mg/dL   Optimal  100-129  mg/dL   Near or Above                    Optimal  130-159  mg/dL   Borderline  160-189  mg/dL   High  >190     mg/dL   Very High 09/12/2009   CREATININE 4.30* 06/10/2013     Lifestyle/ social habits: Mr. Jack Irwin resides in [place] with his wife and 3 children (ages 17, 17, and 34). Occupation: disabled since 2011. Physical activity: limited. Pt reports it is hard to stand due to neuropathy and arthritis. He reports that he can walk only for 3 minutes at a time. He reports he has tried physical therapy for gout in the past, but would like to resume this service, stating it helped him tremendously. He also reports that he has contemplated going [facility] to use the pool, however, he does not currently have a membership.   Nutrition hx/habits: Mr. Wiant reports being overweight his entire life. He reports UBW of 300#. He has slowly lost weight over the past 4 years, due to disabled status. He reports he was in a nursing home in 2011, where he reports he was most successful with weight loss due to institutional environment.  He admits to poor eating habits, including not eating vegetables and excessive consuming sweets and soft drinks. He is most concerned about his renal disease and wants to lose weight to help control it.  Most recently, he has started drinking sugar free Hawaiian Punch, as he does not like water. He admits to drinking as much as 2 2 liter bottles of [place] daily.   Diet recall: Breakfast: gingerale, smoked sausage, grits, bacon, boiled egg; Lunch: chicken OR pasta OR chicken tenders; Dinner: same as lunch; Snacks: cakes, cookies, banana pudding; Beverages mainly consist of sugar free Hawaiian Punch and Gingerale  Nutrition Diagnosis: Excessive energy intake r/t undesirable food choices AEB diet recall, class II obesity.   Nutrition Intervention: Nutrition rx: 1800-2000 Kcal NAS, diabetic, renal diet; 3 meals per day; no snacks; low calorie beverages  only; Physical activity 30 minutes daily  Education/Counseling Provided: Educated pt on principles of weight management. Discussed principles of energy expenditure and how changes in diet and physical activity affect weight status. Discussed nutritional content of commonly eaten foods and suggested healthier alternatives.  Educated pt on plate method and a general, healthful diet that includes low fat dairy, lean meats, whole fruits and vegetables, and whole grains most often. Discussed importance of a healthy diet along with regular physical activity (at least 30 minutes 5 times per week) to achieve weight loss goals. Encouraged slow, moderate weight loss (0.5-2# weight loss per week) and adopting healthy lifestyle changes vs. obtaining a certain body type or weight. Encouraged weighing self weekly at a consistent day and time of choice. Showed pt functionality of MyFitnessPal and encouraged using a food diary to better track caloric intake. Provided "Weight Loss Tips" handout from [facility] Nutrition Care Manual. Also provided ;ist of high potassium foods, 1800 Calorie Diet Plan, and "Destination: Heart Healthy Eating Handouts per request. Also provided "Ture Lemon" samples and coupons per request. Used TeachBack to assess understanding.   Understanding, Motivation, Ability to Follow Recommendations: Expect fair compliance.   Monitoring and Evaluation: Goals: 1) 0.5-2# wt loss per week; 2) Physical activity 30 minutes daily  Recommendations: 1) For weight loss: 1800-2000 kcals daily, 2) Break physical activity into smaller, more frequent sessions; 3) Substitute low calorie beverages (sugar free drinks mixes, water) in place of soft drinks  F/U: 4-6 weeks. Scheduled for 07/19/13 at 1000.   Andoni Busch A. Jimmye Norman, RD, LDN 06/20/2013  Appt EndTime: [id number]

## 2013-06-25 ENCOUNTER — Emergency Department (HOSPITAL_COMMUNITY)
Admission: EM | Admit: 2013-06-25 | Discharge: 2013-06-25 | Disposition: A | Discharge status: Home / Self Care | Payer: Medicare Other | Attending: Emergency Medicine | Admitting: Emergency Medicine

## 2013-06-25 ENCOUNTER — Emergency Department (HOSPITAL_COMMUNITY): Payer: Medicare Other

## 2013-06-25 ENCOUNTER — Encounter (HOSPITAL_COMMUNITY): Payer: Self-pay | Admitting: Emergency Medicine

## 2013-06-25 DIAGNOSIS — M109 Gout, unspecified: Secondary | ICD-10-CM | POA: Insufficient documentation

## 2013-06-25 DIAGNOSIS — I129 Hypertensive chronic kidney disease with stage 1 through stage 4 chronic kidney disease, or unspecified chronic kidney disease: Secondary | ICD-10-CM | POA: Insufficient documentation

## 2013-06-25 DIAGNOSIS — Z872 Personal history of diseases of the skin and subcutaneous tissue: Secondary | ICD-10-CM | POA: Insufficient documentation

## 2013-06-25 DIAGNOSIS — Z862 Personal history of diseases of the blood and blood-forming organs and certain disorders involving the immune mechanism: Secondary | ICD-10-CM | POA: Insufficient documentation

## 2013-06-25 DIAGNOSIS — J40 Bronchitis, not specified as acute or chronic: Secondary | ICD-10-CM

## 2013-06-25 DIAGNOSIS — Z792 Long term (current) use of antibiotics: Secondary | ICD-10-CM | POA: Insufficient documentation

## 2013-06-25 DIAGNOSIS — M129 Arthropathy, unspecified: Secondary | ICD-10-CM | POA: Insufficient documentation

## 2013-06-25 DIAGNOSIS — Z86718 Personal history of other venous thrombosis and embolism: Secondary | ICD-10-CM | POA: Insufficient documentation

## 2013-06-25 DIAGNOSIS — G8929 Other chronic pain: Secondary | ICD-10-CM | POA: Insufficient documentation

## 2013-06-25 DIAGNOSIS — Z79899 Other long term (current) drug therapy: Secondary | ICD-10-CM | POA: Insufficient documentation

## 2013-06-25 DIAGNOSIS — E1129 Type 2 diabetes mellitus with other diabetic kidney complication: Secondary | ICD-10-CM | POA: Insufficient documentation

## 2013-06-25 DIAGNOSIS — J029 Acute pharyngitis, unspecified: Secondary | ICD-10-CM

## 2013-06-25 DIAGNOSIS — Z8719 Personal history of other diseases of the digestive system: Secondary | ICD-10-CM | POA: Insufficient documentation

## 2013-06-25 DIAGNOSIS — N184 Chronic kidney disease, stage 4 (severe): Secondary | ICD-10-CM | POA: Insufficient documentation

## 2013-06-25 LAB — CBG MONITORING, ED: GLUCOSE-CAPILLARY: 85 mg/dL (ref 70–99)

## 2013-06-25 LAB — RAPID STREP SCREEN (MED CTR MEBANE ONLY): STREPTOCOCCUS, GROUP A SCREEN (DIRECT): NEGATIVE

## 2013-06-25 MED ORDER — MAGIC MOUTHWASH W/LIDOCAINE: ORAL | Status: DC

## 2013-06-25 MED ORDER — ALBUTEROL SULFATE HFA 108 (90 BASE) MCG/ACT IN AERS
2.0000 | INHALATION_SPRAY | Freq: Once | RESPIRATORY_TRACT | Status: AC
  Administered 2013-06-25: 2 via RESPIRATORY_TRACT
  Filled 2013-06-25: qty 6.7

## 2013-06-25 NOTE — Discharge Instructions (Signed)
Bronchitis Bronchitis is inflammation of the airways that extend from the windpipe into the lungs (bronchi). The inflammation often causes mucus to develop, which leads to a cough. If the inflammation becomes severe, it may cause shortness of breath. CAUSES  Bronchitis may be caused by:   Viral infections.   Bacteria.   Cigarette smoke.   Allergens, pollutants, and other irritants.  SIGNS AND SYMPTOMS  The most common symptom of bronchitis is a frequent cough that produces mucus. Other symptoms include:  Fever.   Body aches.   Chest congestion.   Chills.   Shortness of breath.   Sore throat.  DIAGNOSIS  Bronchitis is usually diagnosed through a medical history and physical exam. Tests, such as chest X-rays, are sometimes done to rule out other conditions.  TREATMENT  You may need to avoid contact with whatever caused the problem (smoking, for example). Medicines are sometimes needed. These may include:  Antibiotics. These may be prescribed if the condition is caused by bacteria.  Cough suppressants. These may be prescribed for relief of cough symptoms.   Inhaled medicines. These may be prescribed to help open your airways and make it easier for you to breathe.   Steroid medicines. These may be prescribed for those with recurrent (chronic) bronchitis. HOME CARE INSTRUCTIONS  Get plenty of rest.   Drink enough fluids to keep your urine clear or pale yellow (unless you have a medical condition that requires fluid restriction). Increasing fluids may help thin your secretions and will prevent dehydration.   Only take over-the-counter or prescription medicines as directed by your health care provider.  Only take antibiotics as directed. Make sure you finish them even if you start to feel better.  Avoid secondhand smoke, irritating chemicals, and strong fumes. These will make bronchitis worse. If you are a smoker, quit smoking. Consider using nicotine gum or  skin patches to help control withdrawal symptoms. Quitting smoking will help your lungs heal faster.   Put a cool-mist humidifier in your bedroom at night to moisten the air. This may help loosen mucus. Change the water in the humidifier daily. You can also run the hot water in your shower and sit in the bathroom with the door closed for 5 10 minutes.   Follow up with your health care provider as directed.   Wash your hands frequently to avoid catching bronchitis again or spreading an infection to others.  SEEK MEDICAL CARE IF: Your symptoms do not improve after 1 week of treatment.  SEEK IMMEDIATE MEDICAL CARE IF:  Your fever increases.  You have chills.   You have chest pain.   You have worsening shortness of breath.   You have bloody sputum.  You faint.  You have lightheadedness.  You have a severe headache.   You vomit repeatedly. MAKE SURE YOU:   Understand these instructions.  Will watch your condition.  Will get help right away if you are not doing well or get worse. Document Released: 01/20/2005 Document Revised: 11/10/2012 Document Reviewed: 09/14/2012 [facility] Patient Information 2014 [place].  Viral Pharyngitis Viral pharyngitis is a viral infection that produces redness, pain, and swelling (inflammation) of the throat. It can spread from person to person (contagious). CAUSES Viral pharyngitis is caused by inhaling a large amount of certain germs called viruses. Many different viruses cause viral pharyngitis. SYMPTOMS Symptoms of viral pharyngitis include:  Sore throat.  Tiredness.  Stuffy nose.  Low-grade fever.  Congestion.  Cough. TREATMENT Treatment includes rest, drinking plenty of fluids,  and the use of over-the-counter medication (approved by your caregiver). HOME CARE INSTRUCTIONS   Drink enough fluids to keep your urine clear or pale yellow.  Eat soft, cold foods such as ice cream, frozen ice pops, or gelatin  dessert.  Gargle with warm salt water (1 tsp salt per 1 qt of water).  If over age 32, throat lozenges may be used safely.  Only take over-the-counter or prescription medicines for pain, discomfort, or fever as directed by your caregiver. Do not take aspirin. To help prevent spreading viral pharyngitis to others, avoid:  Mouth-to-mouth contact with others.  Sharing utensils for eating and drinking.  Coughing around others. SEEK MEDICAL CARE IF:   You are better in a few days, then become worse.  You have a fever or pain not helped by pain medicines.  There are any other changes that concern you. Document Released: 10/30/2004 Document Revised: 04/14/2011 Document Reviewed: 03/28/2010 [facility] Patient Information 2014 [place], [place].  Use the medicine prescribed for your throat pain.  Take 2 puffs with the albuterol inhaler given every 4 hours if you are wheezing or coughing.  Your chest x-ray is negative for any sign of acute infection today.  Drink plenty of fluids.  As discussed increase your metoprolol dose to twice daily as originally instructed for better blood pressure control.

## 2013-06-25 NOTE — ED Notes (Signed)
Pt c/o sore throat x 4 days. Pt also c/o some wheezing last night. Productive cough-yellow sputum.

## 2013-06-27 LAB — CULTURE, GROUP A STREP

## 2013-06-27 NOTE — ED Provider Notes (Signed)
Medical screening examination/treatment/procedure(s) were performed by non-physician practitioner and as supervising physician I was immediately available for consultation/collaboration.   EKG Interpretation None       Orlie Dakin, MD 06/27/13 1719

## 2013-06-27 NOTE — ED Provider Notes (Signed)
CSN: [id number]     Arrival date & time 06/25/13  [id number] History   First MD Initiated Contact with Patient 06/25/13 0802     Chief Complaint  Patient presents with  . Sore Throat     (Consider location/radiation/quality/duration/timing/severity/associated sxs/prior Treatment) HPI Comments: Jack Irwin is a 48 y.o. Male presenting with a 4 day history of uri type symptoms which includes nasal congestion with clear rhinorrhea, sore throat,and productive cough, occasional yellow sputum.  Symptoms due to not include headache, shortness of breath, chest pain,  Nausea, vomiting or diarrhea, although he reports wheezing last night. He denies fevers and denies history of asthma. The patient has taken robitussin cold, cough and chest formula and coricidin d without relief of symptoms..  He also reports he has not yet taken his home morning medications.      The history is provided by the patient.    Past Medical History  Diagnosis Date  . Hypertension   . Diabetes mellitus   . Gout   . Chronic back pain   . BPH (benign prostatic hyperplasia)   . Anemia   . DVT (deep venous thrombosis)   . Neuropathy   . Decubitus ulcer     of 2nd toes of both feet.  . Edema leg   . Constipation   . Venous (peripheral) insufficiency   . Neuropathy, diabetic   . Physical deconditioning   . Poor balance   . Difficulty walking   . Lack of coordination   . Arthritis   . Renal insufficiency   . Chronic kidney disease   . Chronic kidney disease (CKD), stage IV (severe)    Past Surgical History  Procedure Laterality Date  . None     Family History  Problem Relation Age of Onset  . Diabetes Mother   . Hypertension Mother   . Heart failure Mother   . Hyperlipidemia Mother   . Cancer Father   . Diabetes Father   . Hypertension Father   . Hyperlipidemia Father    History  Substance Use Topics  . Smoking status: Never Smoker   . Smokeless tobacco: Never Used  . Alcohol Use: No     Review of Systems  Constitutional: Negative for fever and chills.  HENT: Positive for congestion, rhinorrhea and sore throat. Negative for ear pain, sinus pressure, trouble swallowing and voice change.   Eyes: Negative for discharge.  Respiratory: Positive for cough and wheezing. Negative for shortness of breath and stridor.   Cardiovascular: Negative for chest pain.  Gastrointestinal: Negative for abdominal pain.  Genitourinary: Negative.       Allergies  Review of patient's allergies indicates no known allergies.  Home Medications   Prior to Admission medications   Medication Sig Start Date End Date Taking? Authorizing Provider  calcitRIOL (ROCALTROL) 0.25 MCG capsule Take 1 capsule by mouth every Monday, Wednesday, and Friday. 06/15/13  Yes Historical Provider, MD  calcium acetate (PHOSLO) 667 MG capsule Take 667 mg by mouth 3 (three) times daily with meals.   Yes Historical Provider, MD  Chlorphen-PE-Acetaminophen (CORICIDIN D COLD/FLU/SINUS PO) Take 1 tablet by mouth as needed (cold symptoms).   Yes Historical Provider, MD  dextromethorphan-guaiFENesin (ROBITUSSIN COLD COUGH+ CHEST) 10-100 MG/5ML liquid Take 5 mLs by mouth every 4 (four) hours as needed for cough.   Yes Historical Provider, MD  febuxostat (ULORIC) 40 MG tablet Take 120 mg by mouth daily.    Yes Historical Provider, MD  metoprolol (LOPRESSOR) 50 MG tablet Take  50 mg by mouth 2 (two) times daily. 11/02/12  Yes Sharyon Cable, MD  Alum & Mag Hydroxide-Simeth (MAGIC MOUTHWASH W/LIDOCAINE) SOLN Gargle and spit 10 mL every 4 hours prn throat pain.    Pharmacy - mix equal parts 06/25/13   Evalee Jefferson, PA-C  AMITRIPTYLINE HCL PO Take 1 tablet by mouth at bedtime as needed (burning  feet).    Historical Provider, MD  cephALEXin (KEFLEX) 500 MG capsule Take 1 capsule (500 mg total) by mouth 4 (four) times daily. 06/10/13   Maudry Diego, MD  colchicine 0.6 MG tablet Take 0.6 mg by mouth as needed (gout). For GOUT PAIN     Historical Provider, MD  furosemide (LASIX) 40 MG tablet Take 40 mg by mouth daily as needed for fluid.     Historical Provider, MD  HYDROcodone-acetaminophen (NORCO/VICODIN) 5-325 MG per tablet Take 1 tablet by mouth every 6 (six) hours as needed for moderate pain. 06/10/13   Maudry Diego, MD  sildenafil (VIAGRA) 100 MG tablet Take 100 mg by mouth daily as needed for erectile dysfunction.    Historical Provider, MD   BP 116/113  Pulse 59  Temp(Src) 98.2 F (36.8 C) (Oral)  Resp 18  Ht 5\' 11"  (1.803 m)  Wt 268 lb (121.564 kg)  BMI 37.39 kg/m2  SpO2 100% Physical Exam  Constitutional: He is oriented to person, place, and time. He appears well-developed and well-nourished.  HENT:  Head: Normocephalic and atraumatic.  Right Ear: Tympanic membrane and ear canal normal.  Left Ear: Tympanic membrane and ear canal normal.  Nose: Mucosal edema and rhinorrhea present.  Mouth/Throat: Uvula is midline and mucous membranes are normal. Posterior oropharyngeal erythema present. No oropharyngeal exudate, posterior oropharyngeal edema or tonsillar abscesses.  Eyes: Conjunctivae are normal.  Cardiovascular: Normal rate and normal heart sounds.   Pulmonary/Chest: Effort normal. No respiratory distress. He has no decreased breath sounds. He has no wheezes. He has no rhonchi. He has no rales.  Abdominal: Soft. There is no tenderness.  Musculoskeletal: Normal range of motion.  Neurological: He is alert and oriented to person, place, and time.  Skin: Skin is warm and dry. No rash noted.  Psychiatric: He has a normal mood and affect.    ED Course  Procedures (including critical care time) Labs Review Labs Reviewed  RAPID STREP SCREEN  CULTURE, GROUP A STREP  CBG MONITORING, ED    Imaging Review No results found.   EKG Interpretation None      MDM   Final diagnoses:  Bronchitis  Pharyngitis    Patients labs and/or radiological studies were viewed and considered during the medical  decision making and disposition process. Pt was given albuterol mdi for prn use,  Although no wheezing heard on todays exam.  cxr normal.  He was prescribed magic mouthwash for prn gargle for throat pain relief. Discussed need to take morning home meds including bp med once home.  Pt understands and agrees with plan.    Evalee Jefferson, PA-C 06/27/13 1325

## 2013-06-29 ENCOUNTER — Encounter (HOSPITAL_COMMUNITY): Payer: Self-pay | Admitting: Emergency Medicine

## 2013-06-29 ENCOUNTER — Emergency Department (HOSPITAL_COMMUNITY)
Admission: EM | Admit: 2013-06-29 | Discharge: 2013-06-29 | Disposition: A | Discharge status: Home / Self Care | Payer: Medicare Other | Attending: Emergency Medicine | Admitting: Emergency Medicine

## 2013-06-29 DIAGNOSIS — G8929 Other chronic pain: Secondary | ICD-10-CM | POA: Insufficient documentation

## 2013-06-29 DIAGNOSIS — J029 Acute pharyngitis, unspecified: Secondary | ICD-10-CM | POA: Insufficient documentation

## 2013-06-29 DIAGNOSIS — N184 Chronic kidney disease, stage 4 (severe): Secondary | ICD-10-CM | POA: Insufficient documentation

## 2013-06-29 DIAGNOSIS — Z872 Personal history of diseases of the skin and subcutaneous tissue: Secondary | ICD-10-CM | POA: Insufficient documentation

## 2013-06-29 DIAGNOSIS — E1149 Type 2 diabetes mellitus with other diabetic neurological complication: Secondary | ICD-10-CM | POA: Insufficient documentation

## 2013-06-29 DIAGNOSIS — N400 Enlarged prostate without lower urinary tract symptoms: Secondary | ICD-10-CM | POA: Insufficient documentation

## 2013-06-29 DIAGNOSIS — Z862 Personal history of diseases of the blood and blood-forming organs and certain disorders involving the immune mechanism: Secondary | ICD-10-CM | POA: Insufficient documentation

## 2013-06-29 DIAGNOSIS — Z86718 Personal history of other venous thrombosis and embolism: Secondary | ICD-10-CM | POA: Insufficient documentation

## 2013-06-29 DIAGNOSIS — M109 Gout, unspecified: Secondary | ICD-10-CM | POA: Insufficient documentation

## 2013-06-29 DIAGNOSIS — R634 Abnormal weight loss: Secondary | ICD-10-CM | POA: Insufficient documentation

## 2013-06-29 DIAGNOSIS — E1142 Type 2 diabetes mellitus with diabetic polyneuropathy: Secondary | ICD-10-CM | POA: Insufficient documentation

## 2013-06-29 DIAGNOSIS — Z79899 Other long term (current) drug therapy: Secondary | ICD-10-CM | POA: Insufficient documentation

## 2013-06-29 DIAGNOSIS — I129 Hypertensive chronic kidney disease with stage 1 through stage 4 chronic kidney disease, or unspecified chronic kidney disease: Secondary | ICD-10-CM | POA: Insufficient documentation

## 2013-06-29 DIAGNOSIS — I872 Venous insufficiency (chronic) (peripheral): Secondary | ICD-10-CM | POA: Insufficient documentation

## 2013-06-29 DIAGNOSIS — R51 Headache: Secondary | ICD-10-CM | POA: Insufficient documentation

## 2013-06-29 DIAGNOSIS — K5900 Constipation, unspecified: Secondary | ICD-10-CM | POA: Insufficient documentation

## 2013-06-29 LAB — CBG MONITORING, ED: GLUCOSE-CAPILLARY: 103 mg/dL — AB (ref 70–99)

## 2013-06-29 MED ORDER — AMOXICILLIN 250 MG PO CAPS
500.0000 mg | ORAL_CAPSULE | Freq: Once | ORAL | Status: AC
Start: 2013-06-29 — End: 2013-06-29
  Administered 2013-06-29: 500 mg via ORAL
  Filled 2013-06-29: qty 2

## 2013-06-29 MED ORDER — AMOXICILLIN 500 MG PO CAPS: 500.0000 mg | ORAL_CAPSULE | Freq: Three times a day (TID) | ORAL | Status: DC

## 2013-06-29 MED ORDER — METOPROLOL TARTRATE 50 MG PO TABS
50.0000 mg | ORAL_TABLET | Freq: Once | ORAL | Status: AC
  Administered 2013-06-29: 50 mg via ORAL
  Filled 2013-06-29: qty 1

## 2013-06-29 MED ORDER — METOPROLOL TARTRATE 50 MG PO TABS: 50.0000 mg | ORAL_TABLET | Freq: Two times a day (BID) | ORAL | Status: DC

## 2013-06-29 MED ORDER — ACETAMINOPHEN 500 MG PO TABS
1000.0000 mg | ORAL_TABLET | Freq: Once | ORAL | Status: AC
  Administered 2013-06-29: 1000 mg via ORAL
  Filled 2013-06-29: qty 2

## 2013-06-29 NOTE — ED Notes (Signed)
Pt c/o sore throat, headache, puffy eyes, and  Dizziness x 1 week.  Reports started taking theraflu but had a nose bleed shortly after.

## 2013-06-29 NOTE — Discharge Instructions (Signed)
Increase your metoprolol to 50 mg twice a day.  Prescription for antibiotic. Do not miss your appointment with the nephrologist. Tylenol for headache.

## 2013-06-29 NOTE — ED Provider Notes (Signed)
CSN: [id number]     Arrival date & time 06/29/13  1556 History  This chart was scribed for Nat Christen, MD by Ladene Artist, ED Scribe. The patient was seen in room APA11/APA11. Patient's care was started at 8:25 PM.    Chief Complaint  Patient presents with  . Sore Throat  . Headache    The history is provided by the patient. No language interpreter was used.   HPI Comments: Jack Irwin is a 48 y.o. male, with a h/o Stage IV Chronic Kidney Disease, who presents to the Emergency Department complaining of sore throat onset 5 days ago. He reports associated dizziness, nosebleed, HA, eye watering, puffy eyes, chills, sweats, body aches. Pt also reports 5 lb weight lost within 5 days. He denies fever. He has tried Theraflu with no relief but reports a nosebleed shortly after. Pt states that he was seen here 4 days ago and prescribed magic mouthwash with lidocaine that has not improved his symptoms. He admits to not taking his BP medication as directed. ED BP 150/97. Pt states that he needs a refill on his medication. He has not seen his PCP [facility] in a few months. Pt has an appointment with a nephrologist in June.   Past Medical History  Diagnosis Date  . Hypertension   . Diabetes mellitus   . Gout   . Chronic back pain   . BPH (benign prostatic hyperplasia)   . Anemia   . DVT (deep venous thrombosis)   . Neuropathy   . Decubitus ulcer     of 2nd toes of both feet.  . Edema leg   . Constipation   . Venous (peripheral) insufficiency   . Neuropathy, diabetic   . Physical deconditioning   . Poor balance   . Difficulty walking   . Lack of coordination   . Arthritis   . Renal insufficiency   . Chronic kidney disease   . Chronic kidney disease (CKD), stage IV (severe)    Past Surgical History  Procedure Laterality Date  . None     Family History  Problem Relation Age of Onset  . Diabetes Mother   . Hypertension Mother   . Heart failure Mother   . Hyperlipidemia Mother    . Cancer Father   . Diabetes Father   . Hypertension Father   . Hyperlipidemia Father    History  Substance Use Topics  . Smoking status: Never Smoker   . Smokeless tobacco: Never Used  . Alcohol Use: No    Review of Systems  Constitutional: Positive for chills, diaphoresis and unexpected weight change (mild). Negative for fever.  HENT: Positive for nosebleeds and sore throat.   Eyes: Positive for discharge (watery).  Musculoskeletal: Positive for myalgias.  Neurological: Positive for dizziness and headaches.  All other systems reviewed and are negative.  Allergies  Review of patient's allergies indicates no known allergies.  Home Medications   Prior to Admission medications   Medication Sig Start Date End Date Taking? Authorizing Provider  Alum & Mag Hydroxide-Simeth (MAGIC MOUTHWASH W/LIDOCAINE) SOLN Gargle and spit 10 mL every 4 hours prn throat pain.    Pharmacy - mix equal parts 06/25/13   Evalee Jefferson, PA-C  AMITRIPTYLINE HCL PO Take 1 tablet by mouth at bedtime as needed (burning  feet).    Historical Provider, MD  calcitRIOL (ROCALTROL) 0.25 MCG capsule Take 1 capsule by mouth every Monday, Wednesday, and Friday. 06/15/13   Historical Provider, MD  calcium acetate (PHOSLO)  667 MG capsule Take 667 mg by mouth 3 (three) times daily with meals.    Historical Provider, MD  cephALEXin (KEFLEX) 500 MG capsule Take 1 capsule (500 mg total) by mouth 4 (four) times daily. 06/10/13   Maudry Diego, MD  Chlorphen-PE-Acetaminophen (CORICIDIN D COLD/FLU/SINUS PO) Take 1 tablet by mouth as needed (cold symptoms).    Historical Provider, MD  colchicine 0.6 MG tablet Take 0.6 mg by mouth as needed (gout). For GOUT PAIN    Historical Provider, MD  dextromethorphan-guaiFENesin (ROBITUSSIN COLD COUGH+ CHEST) 10-100 MG/5ML liquid Take 5 mLs by mouth every 4 (four) hours as needed for cough.    Historical Provider, MD  febuxostat (ULORIC) 40 MG tablet Take 120 mg by mouth daily.     Historical  Provider, MD  furosemide (LASIX) 40 MG tablet Take 40 mg by mouth daily as needed for fluid.     Historical Provider, MD  HYDROcodone-acetaminophen (NORCO/VICODIN) 5-325 MG per tablet Take 1 tablet by mouth every 6 (six) hours as needed for moderate pain. 06/10/13   Maudry Diego, MD  metoprolol (LOPRESSOR) 50 MG tablet Take 50 mg by mouth 2 (two) times daily. 11/02/12   Sharyon Cable, MD  sildenafil (VIAGRA) 100 MG tablet Take 100 mg by mouth daily as needed for erectile dysfunction.    Historical Provider, MD   Kathi Der Vitals: BP 117/93  Pulse 106  Temp(Src) 98.4 F (36.9 C) (Oral)  Resp 18  SpO2 97% Physical Exam  Nursing note and vitals reviewed. Constitutional: He is oriented to person, place, and time. He appears well-developed and well-nourished.  HENT:  Head: Normocephalic and atraumatic.  Mouth/Throat: Posterior oropharyngeal erythema (mild) present.  Eyes: Conjunctivae and EOM are normal. Pupils are equal, round, and reactive to light.  Neck: Normal range of motion. Neck supple.  Cardiovascular: Normal rate, regular rhythm and normal heart sounds.   Pulmonary/Chest: Effort normal and breath sounds normal.  Abdominal: Soft. Bowel sounds are normal.  Musculoskeletal: Normal range of motion.  Neurological: He is alert and oriented to person, place, and time.  Skin: Skin is warm and dry.  Psychiatric: He has a normal mood and affect. His behavior is normal.   ED Course  Procedures (including critical care time) DIAGNOSTIC STUDIES: Oxygen Saturation is 98% on RA, normal by my interpretation.    COORDINATION OF CARE: 8:35 PM-Discussed treatment plan which includes antibiotics and BP medication with pt at bedside and pt agreed to plan.   Labs Review Results for orders placed during the hospital encounter of 06/29/13  CBG MONITORING, ED      Result Value Ref Range   Glucose-Capillary 103 (*) 70 - 99 mg/dL   Imaging Review No results found.   EKG Interpretation None       MDM   Final diagnoses:  Pharyngitis    Evidence of meningitis. Will start antibiotic because patient is immunocompromised. Also, I recommended increasing his metoprolol to 50 mg twice a day. Rx for same. Patient has followup visit with nephrologist in June.  I personally performed the services described in this documentation, which was scribed in my presence. The recorded information has been reviewed and is accurate.    Nat Christen, MD 06/29/13 2116

## 2013-07-19 ENCOUNTER — Encounter (HOSPITAL_COMMUNITY): Payer: Self-pay | Admitting: Dietician

## 2013-07-19 NOTE — Progress Notes (Signed)
Pt was a no-show for appointment scheduled for 07/19/2013 at 1000.

## 2013-08-27 IMAGING — CR DG ABDOMEN ACUTE W/ 1V CHEST
3 series · 3 of 3 positions shown · non-contrast
Comparison: none

CLINICAL DATA: Abdomen pain for 2 days

ACUTE ABDOMEN SERIES (ABDOMEN 2 VIEW & CHEST 1 VIEW)

[view not recorded (1 of 3)]
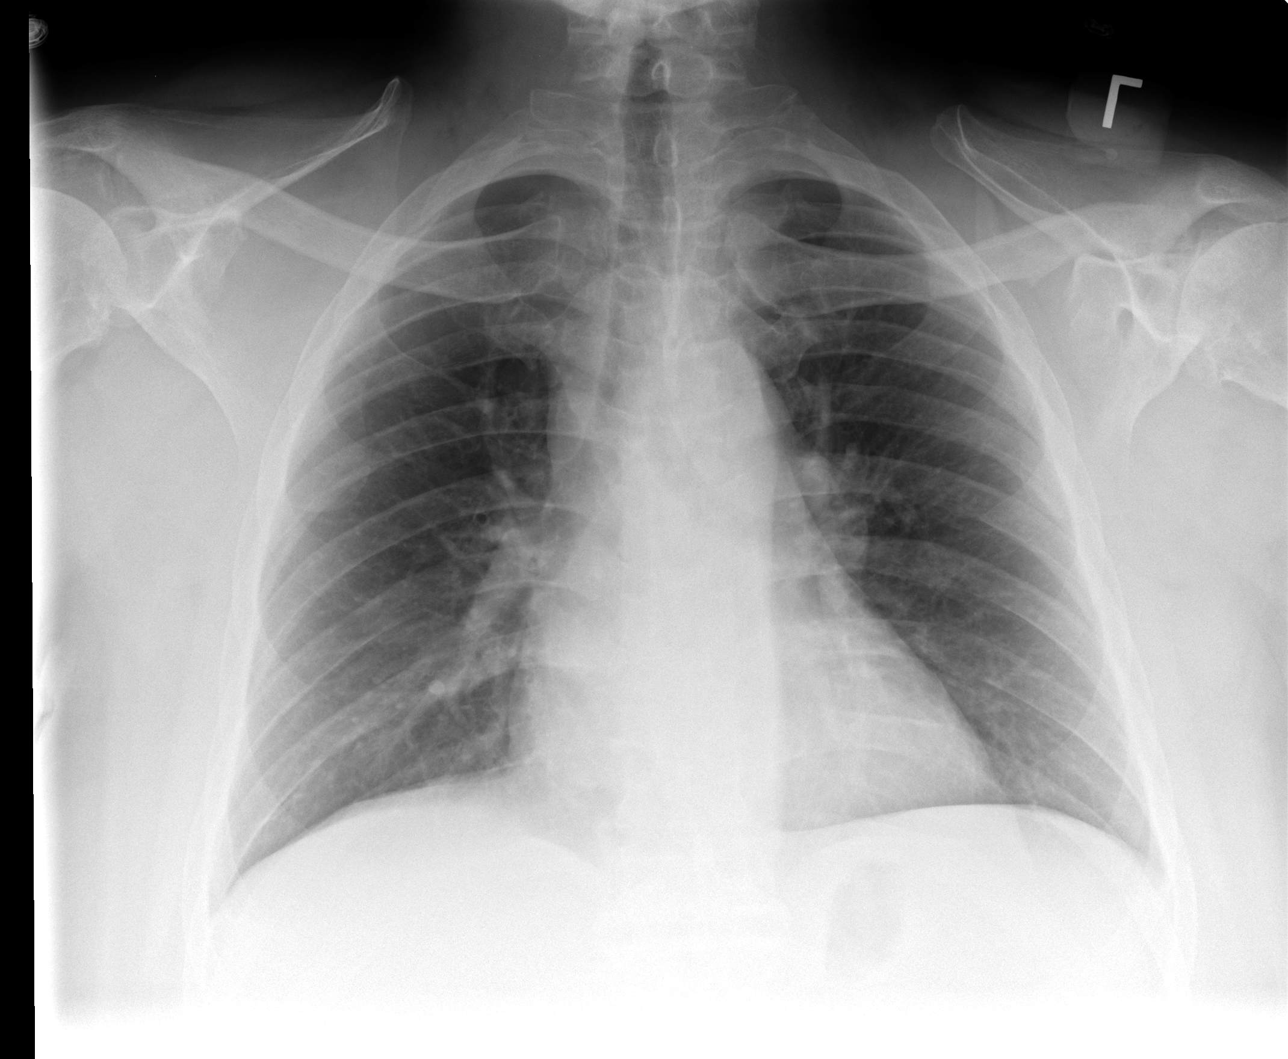

[view not recorded (2 of 3)]
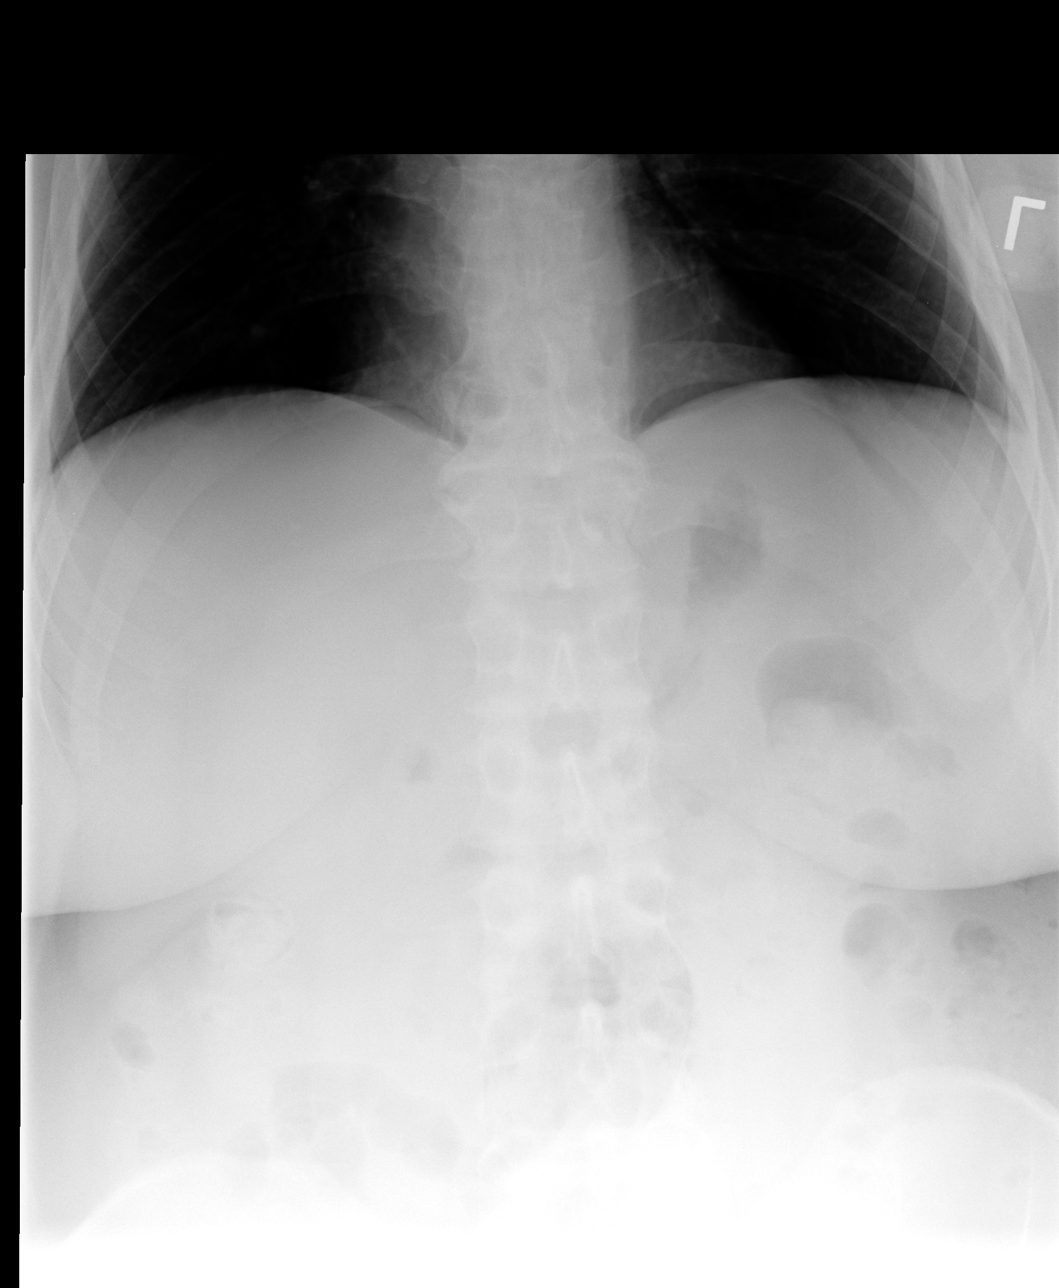

[view not recorded (3 of 3)]
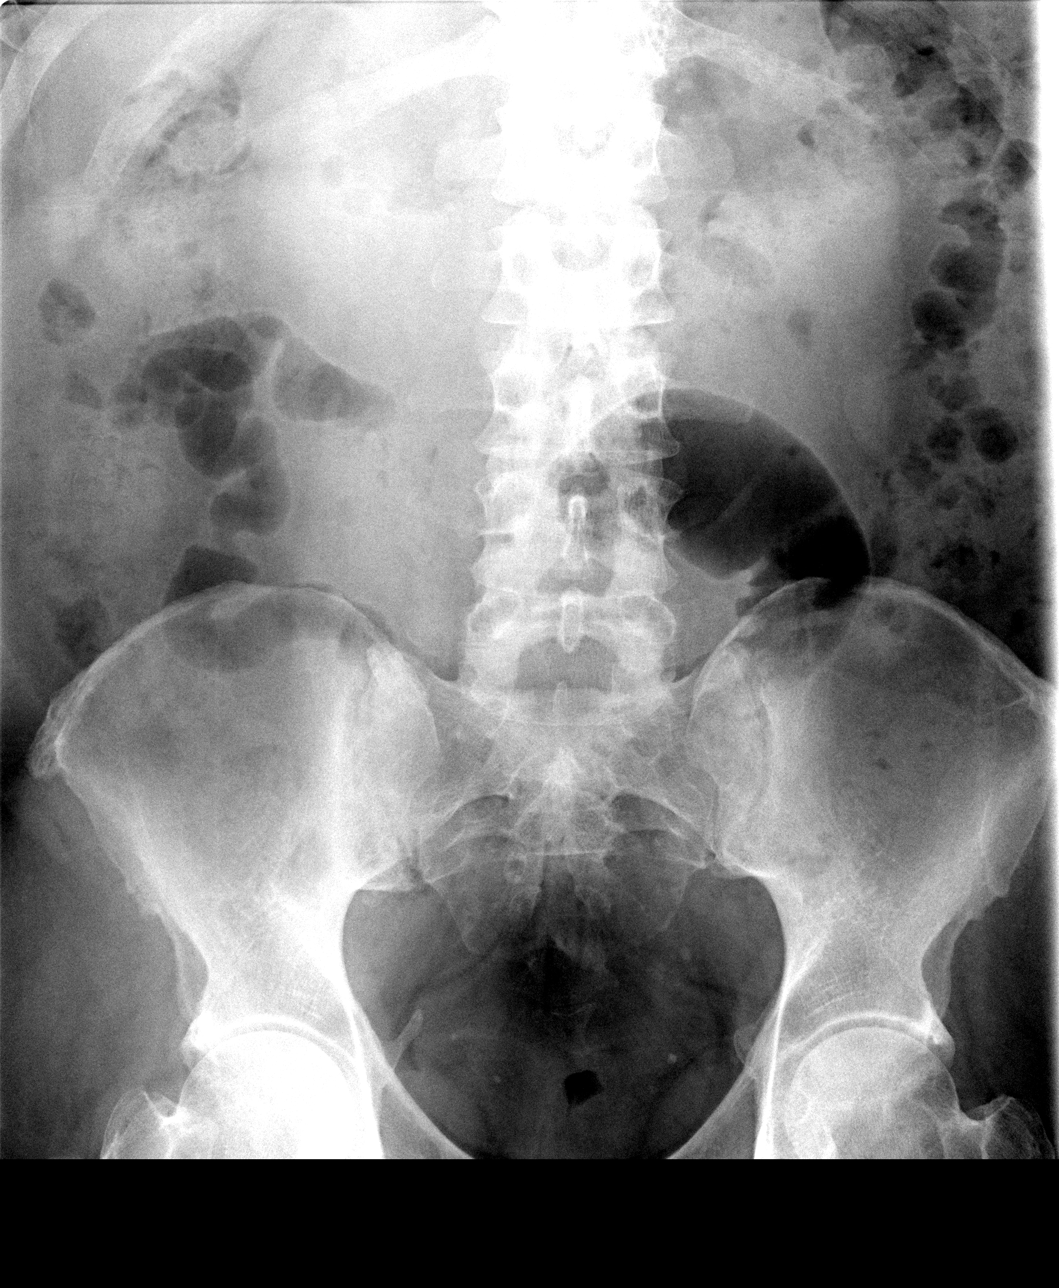

[3 of 3 positions shown; findings below may reference images not displayed]

FINDINGS: PA chest:

Compared December 02, 2011.
FINDINGS: No edema or consolidation.  Heart size and pulmonary
vascularity are normal.  No adenopathy.  No bone lesions.

Supine and upright abdomen:  Bowel gas pattern is unremarkable.
There is stool throughout the colon.  No obstruction or free air is
seen on this study.  There are phleboliths in the pelvis.
IMPRESSION: Nonspecific gas pattern.  No edema or consolidation in
the lungs.

## 2013-09-02 IMAGING — CR DG CHEST 2V
2 series · 2 of 2 positions shown · non-contrast
Comparison: 02/24/2012 and 12/02/2011.

CLINICAL DATA: Flu-like symptoms.

CHEST - 2 VIEW

[view not recorded (1 of 2)]
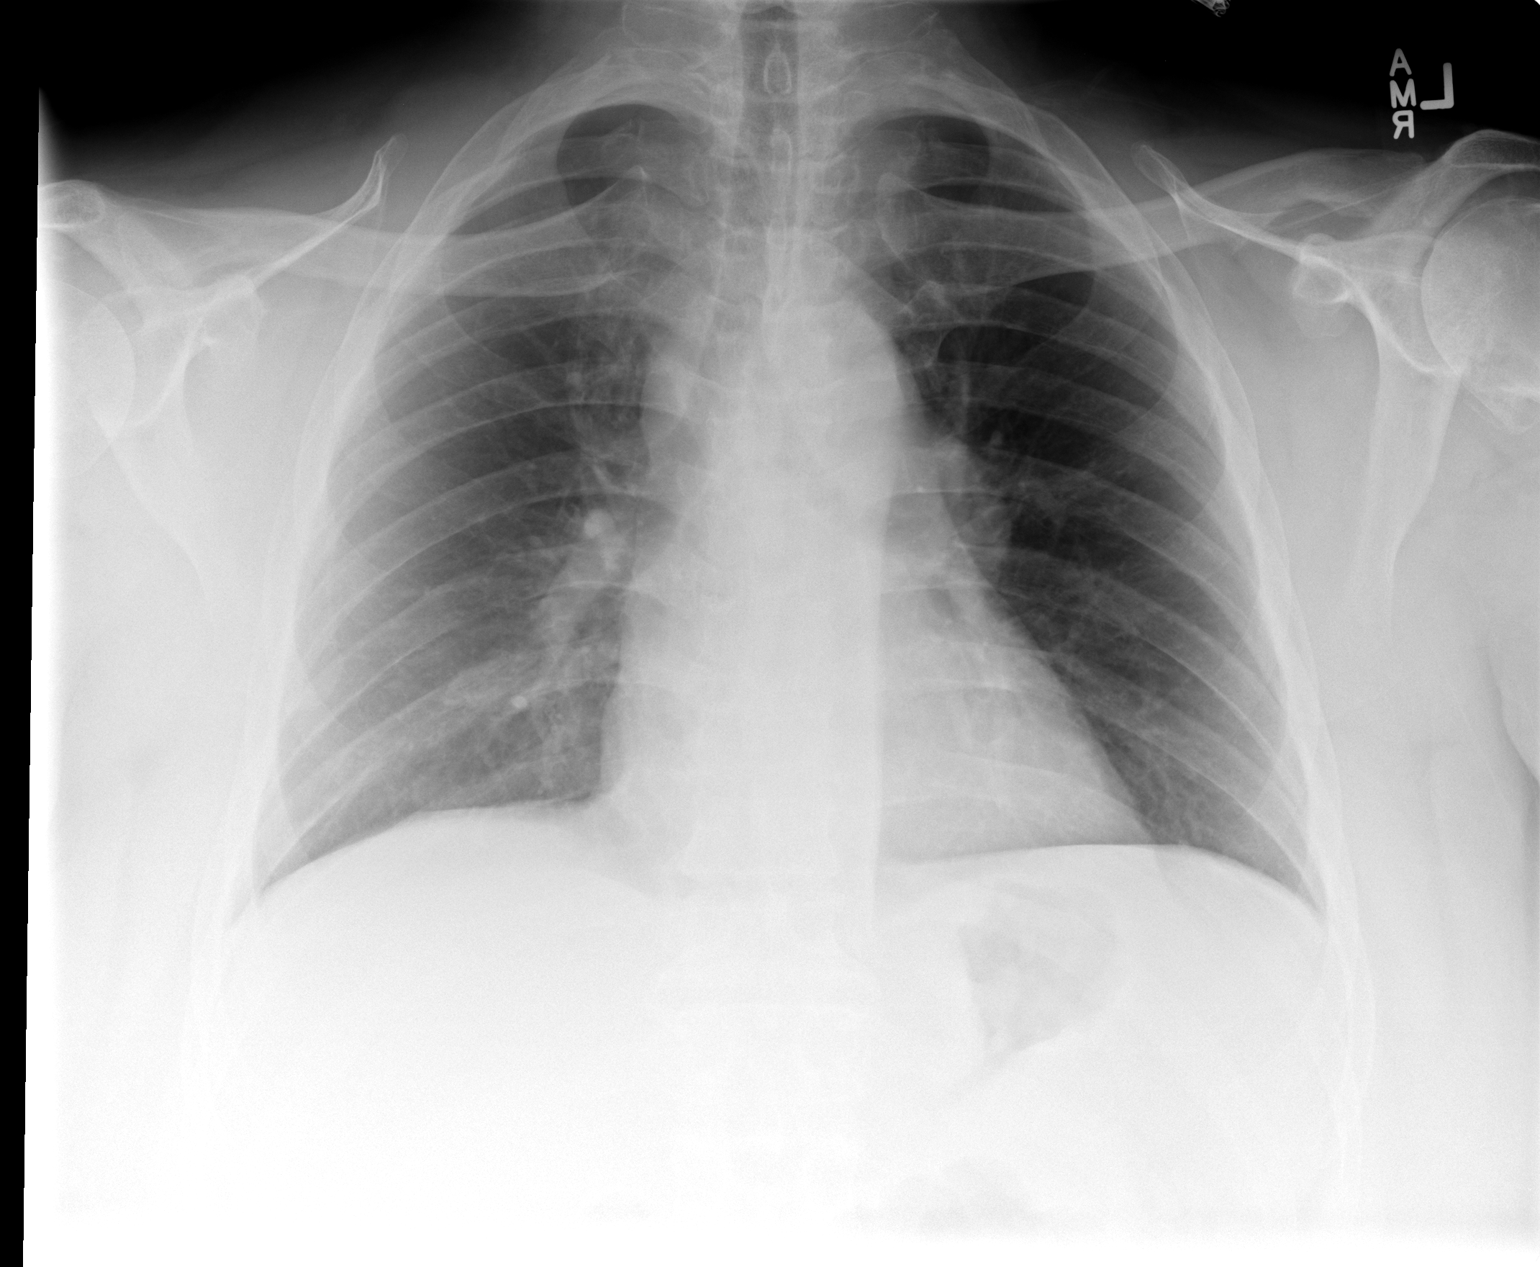

[view not recorded (2 of 2)]
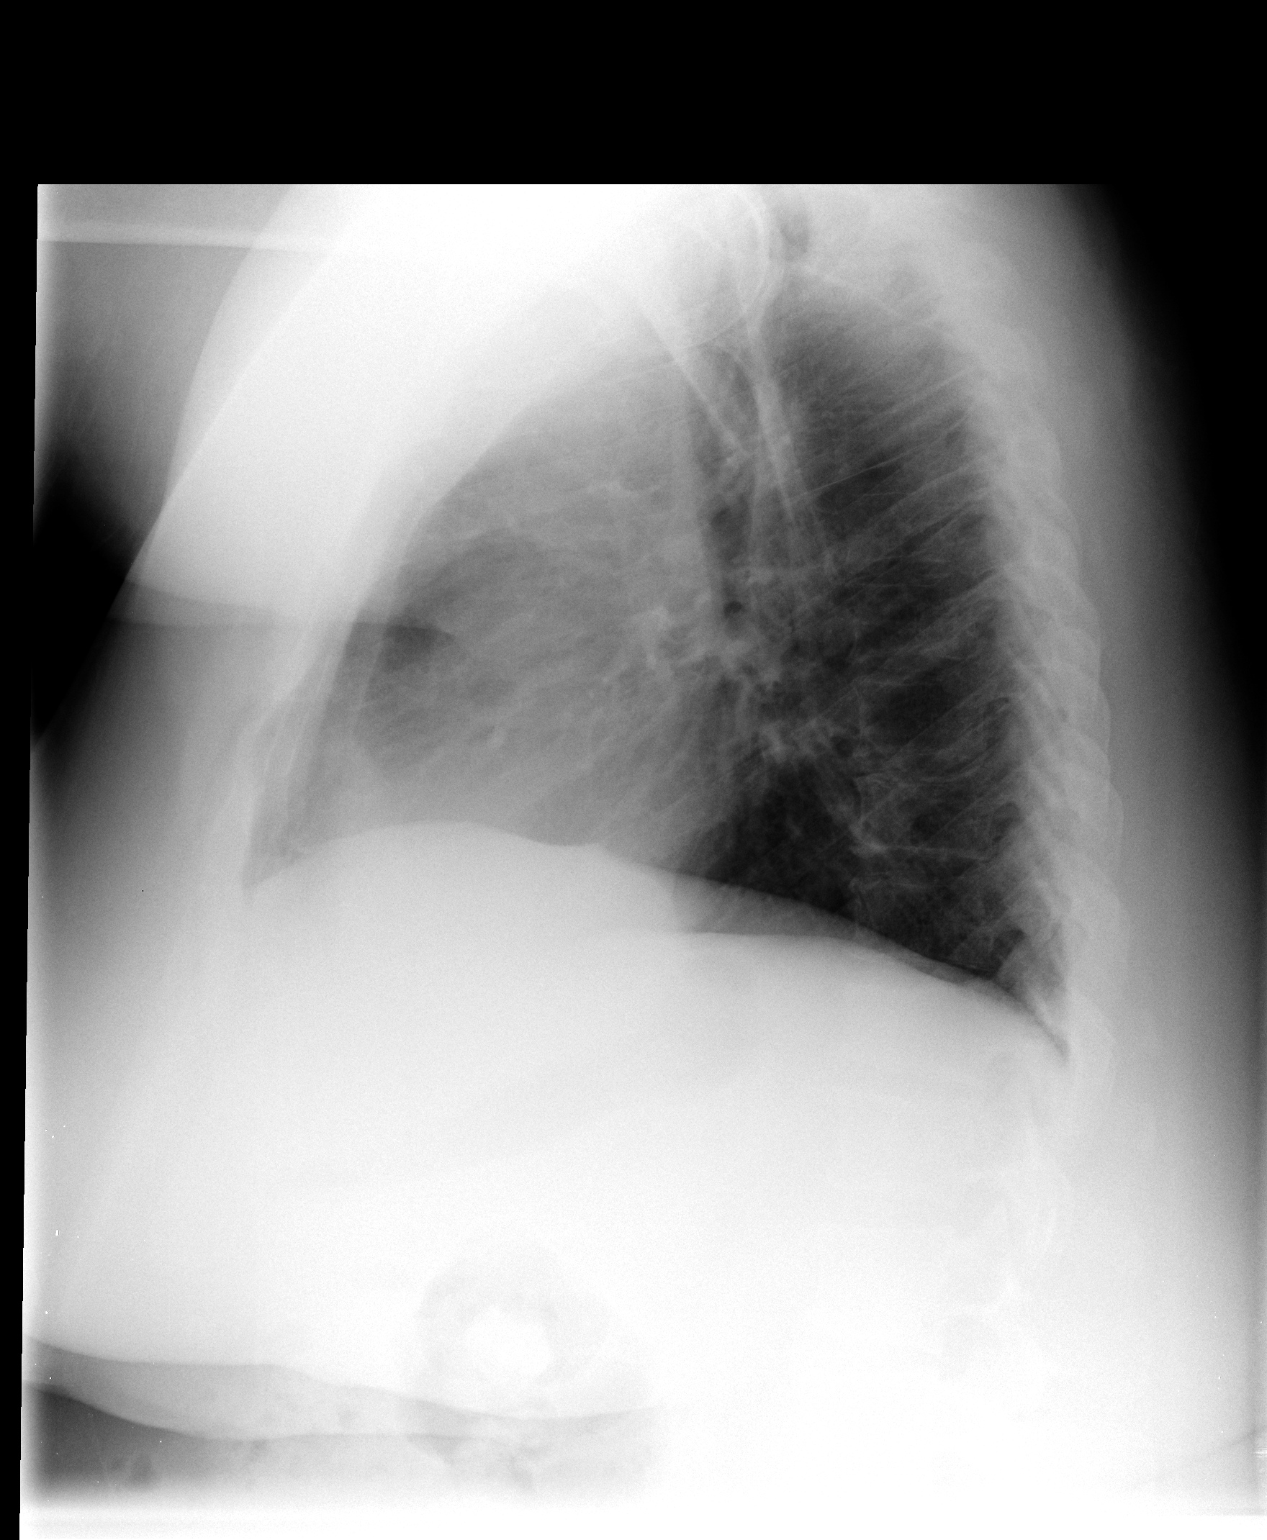

[2 of 2 positions shown; findings below may reference images not displayed]

FINDINGS: The heart size and mediastinal contours are stable.
There is mild chronic central airway thickening.  The lungs are
otherwise clear and are not significantly hyperinflated.  There is
no pleural effusion or pneumothorax.  No acute osseous
abnormalities are identified.  There are asymmetric left humeral
head osteophytes.
IMPRESSION: Stable examination.  No acute cardiopulmonary process.

## 2013-09-20 ENCOUNTER — Ambulatory Visit: Payer: Medicare Other | Admitting: Podiatry

## 2013-09-24 IMAGING — CR DG CHEST 2V
2 series · 2 of 2 positions shown · non-contrast
Comparison: Prior chest x-ray 03/01/2012

CLINICAL DATA: Will check, cough

CHEST - 2 VIEW

[view not recorded (1 of 2)]
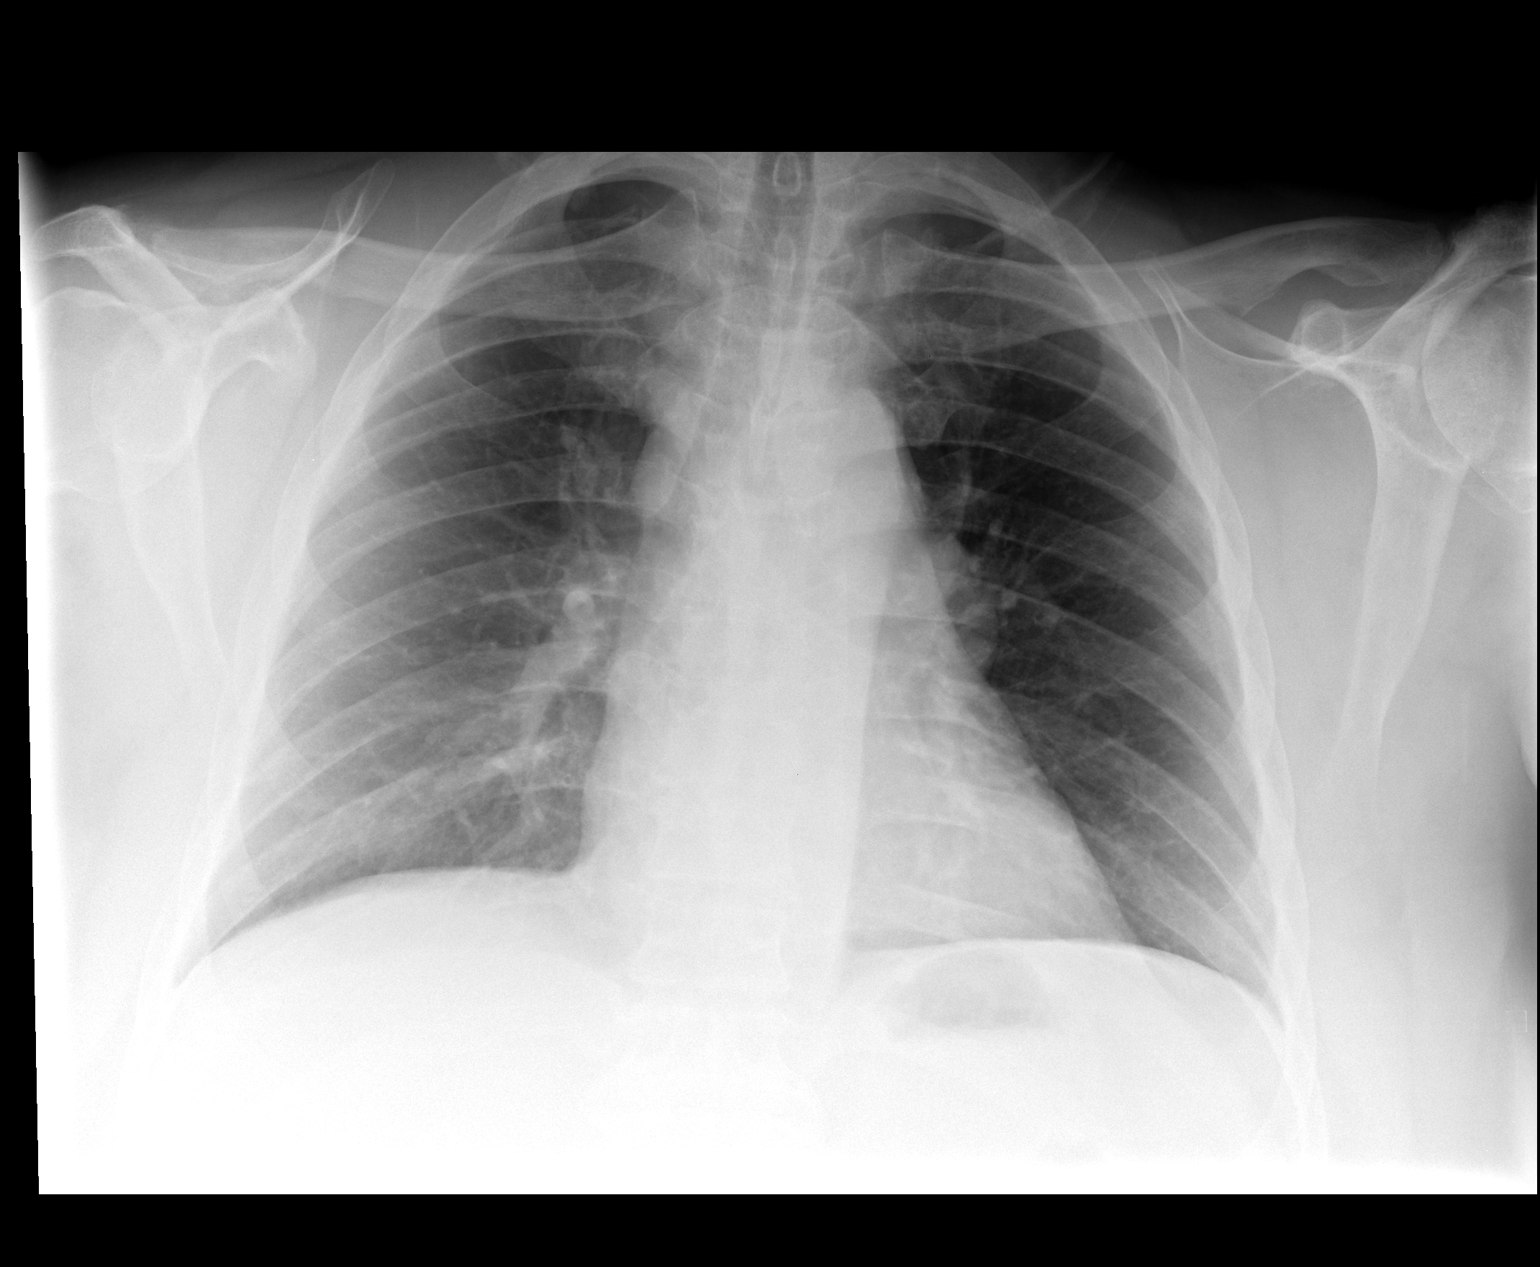

[view not recorded (2 of 2)]
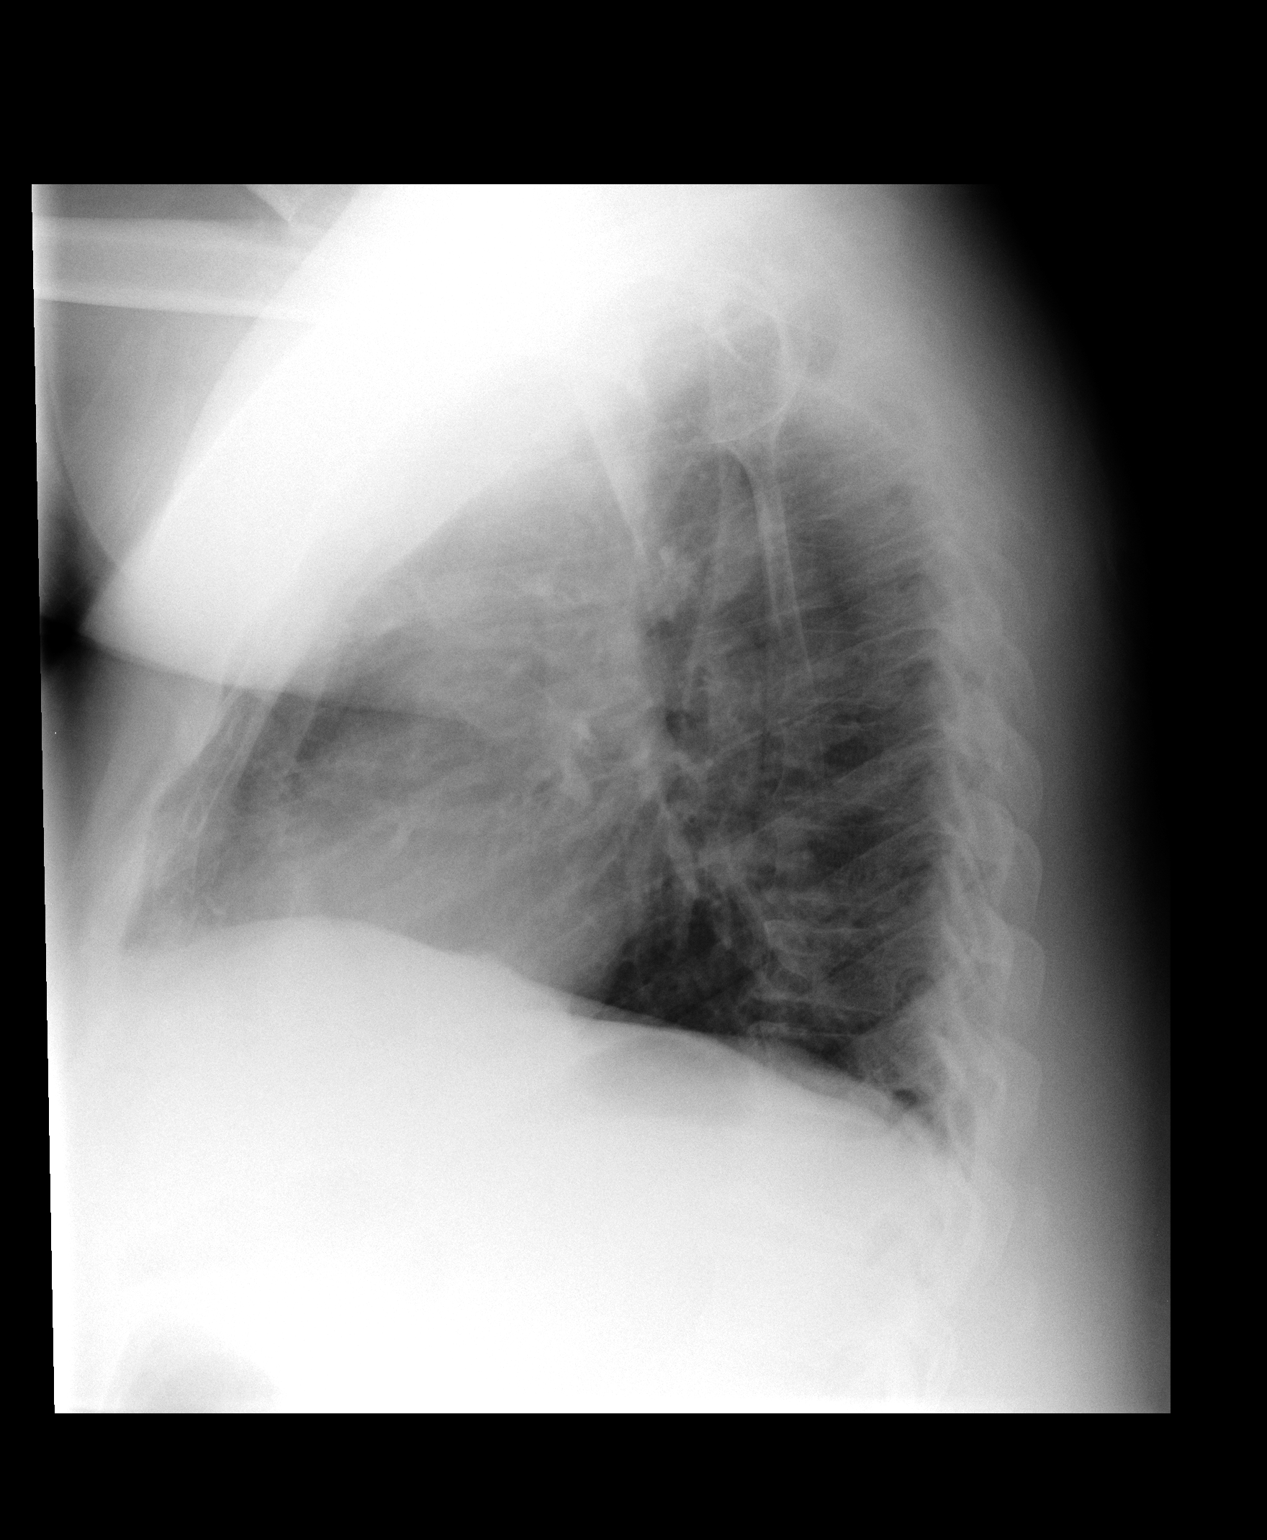

[2 of 2 positions shown; findings below may reference images not displayed]

FINDINGS: [The lungs are well-aerated and free from pulmonary
edema, focal airspace consolidation or pulmonary nodule.  Stable
mild pulmonary hyperinflation and central bronchitic changes.
Cardiac and mediastinal contours are within normal limits.  No
pneumothorax, or pleural effusion. No acute osseous findings.]
IMPRESSION: No acute cardiopulmonary disease.

## 2013-10-04 ENCOUNTER — Encounter (HOSPITAL_COMMUNITY): Payer: Self-pay | Admitting: Emergency Medicine

## 2013-10-04 ENCOUNTER — Emergency Department (HOSPITAL_COMMUNITY)
Admission: EM | Admit: 2013-10-04 | Discharge: 2013-10-04 | Disposition: A | Discharge status: Home / Self Care | Payer: Medicare Other | Attending: Emergency Medicine | Admitting: Emergency Medicine

## 2013-10-04 DIAGNOSIS — R52 Pain, unspecified: Secondary | ICD-10-CM | POA: Diagnosis not present

## 2013-10-04 DIAGNOSIS — Z872 Personal history of diseases of the skin and subcutaneous tissue: Secondary | ICD-10-CM | POA: Insufficient documentation

## 2013-10-04 DIAGNOSIS — N184 Chronic kidney disease, stage 4 (severe): Secondary | ICD-10-CM | POA: Insufficient documentation

## 2013-10-04 DIAGNOSIS — E1149 Type 2 diabetes mellitus with other diabetic neurological complication: Secondary | ICD-10-CM | POA: Diagnosis not present

## 2013-10-04 DIAGNOSIS — N189 Chronic kidney disease, unspecified: Secondary | ICD-10-CM

## 2013-10-04 DIAGNOSIS — M25561 Pain in right knee: Secondary | ICD-10-CM

## 2013-10-04 DIAGNOSIS — Z8739 Personal history of other diseases of the musculoskeletal system and connective tissue: Secondary | ICD-10-CM | POA: Diagnosis not present

## 2013-10-04 DIAGNOSIS — Z86718 Personal history of other venous thrombosis and embolism: Secondary | ICD-10-CM | POA: Diagnosis not present

## 2013-10-04 DIAGNOSIS — M109 Gout, unspecified: Secondary | ICD-10-CM | POA: Diagnosis not present

## 2013-10-04 DIAGNOSIS — G8929 Other chronic pain: Secondary | ICD-10-CM | POA: Insufficient documentation

## 2013-10-04 DIAGNOSIS — M7989 Other specified soft tissue disorders: Secondary | ICD-10-CM | POA: Insufficient documentation

## 2013-10-04 DIAGNOSIS — E669 Obesity, unspecified: Secondary | ICD-10-CM | POA: Diagnosis not present

## 2013-10-04 DIAGNOSIS — E1142 Type 2 diabetes mellitus with diabetic polyneuropathy: Secondary | ICD-10-CM | POA: Diagnosis not present

## 2013-10-04 DIAGNOSIS — F411 Generalized anxiety disorder: Secondary | ICD-10-CM | POA: Insufficient documentation

## 2013-10-04 DIAGNOSIS — I129 Hypertensive chronic kidney disease with stage 1 through stage 4 chronic kidney disease, or unspecified chronic kidney disease: Secondary | ICD-10-CM | POA: Diagnosis not present

## 2013-10-04 DIAGNOSIS — M25569 Pain in unspecified knee: Secondary | ICD-10-CM | POA: Insufficient documentation

## 2013-10-04 DIAGNOSIS — Z862 Personal history of diseases of the blood and blood-forming organs and certain disorders involving the immune mechanism: Secondary | ICD-10-CM | POA: Diagnosis not present

## 2013-10-04 DIAGNOSIS — Z79899 Other long term (current) drug therapy: Secondary | ICD-10-CM | POA: Insufficient documentation

## 2013-10-04 DIAGNOSIS — Z8719 Personal history of other diseases of the digestive system: Secondary | ICD-10-CM | POA: Insufficient documentation

## 2013-10-04 DIAGNOSIS — Z792 Long term (current) use of antibiotics: Secondary | ICD-10-CM | POA: Diagnosis not present

## 2013-10-04 LAB — I-STAT CHEM 8, ED
BUN: 66 mg/dL — AB (ref 6–23)
CALCIUM ION: 1 mmol/L — AB (ref 1.12–1.23)
CHLORIDE: 113 meq/L — AB (ref 96–112)
CREATININE: 5 mg/dL — AB (ref 0.50–1.35)
Glucose, Bld: 84 mg/dL (ref 70–99)
HCT: 43 % (ref 39.0–52.0)
Hemoglobin: 14.6 g/dL (ref 13.0–17.0)
Potassium: 5.3 mEq/L (ref 3.7–5.3)
Sodium: 137 mEq/L (ref 137–147)
TCO2: 17 mmol/L (ref 0–100)

## 2013-10-04 IMAGING — CR DG CHEST 2V
2 series · 2 of 2 positions shown · non-contrast
Comparison: 03/23/2012 and earlier.

CLINICAL DATA: 46-year-old male weakness cough shortness of breath
bilateral lower extremity swelling.

CHEST - 2 VIEW

[view not recorded (1 of 2)]
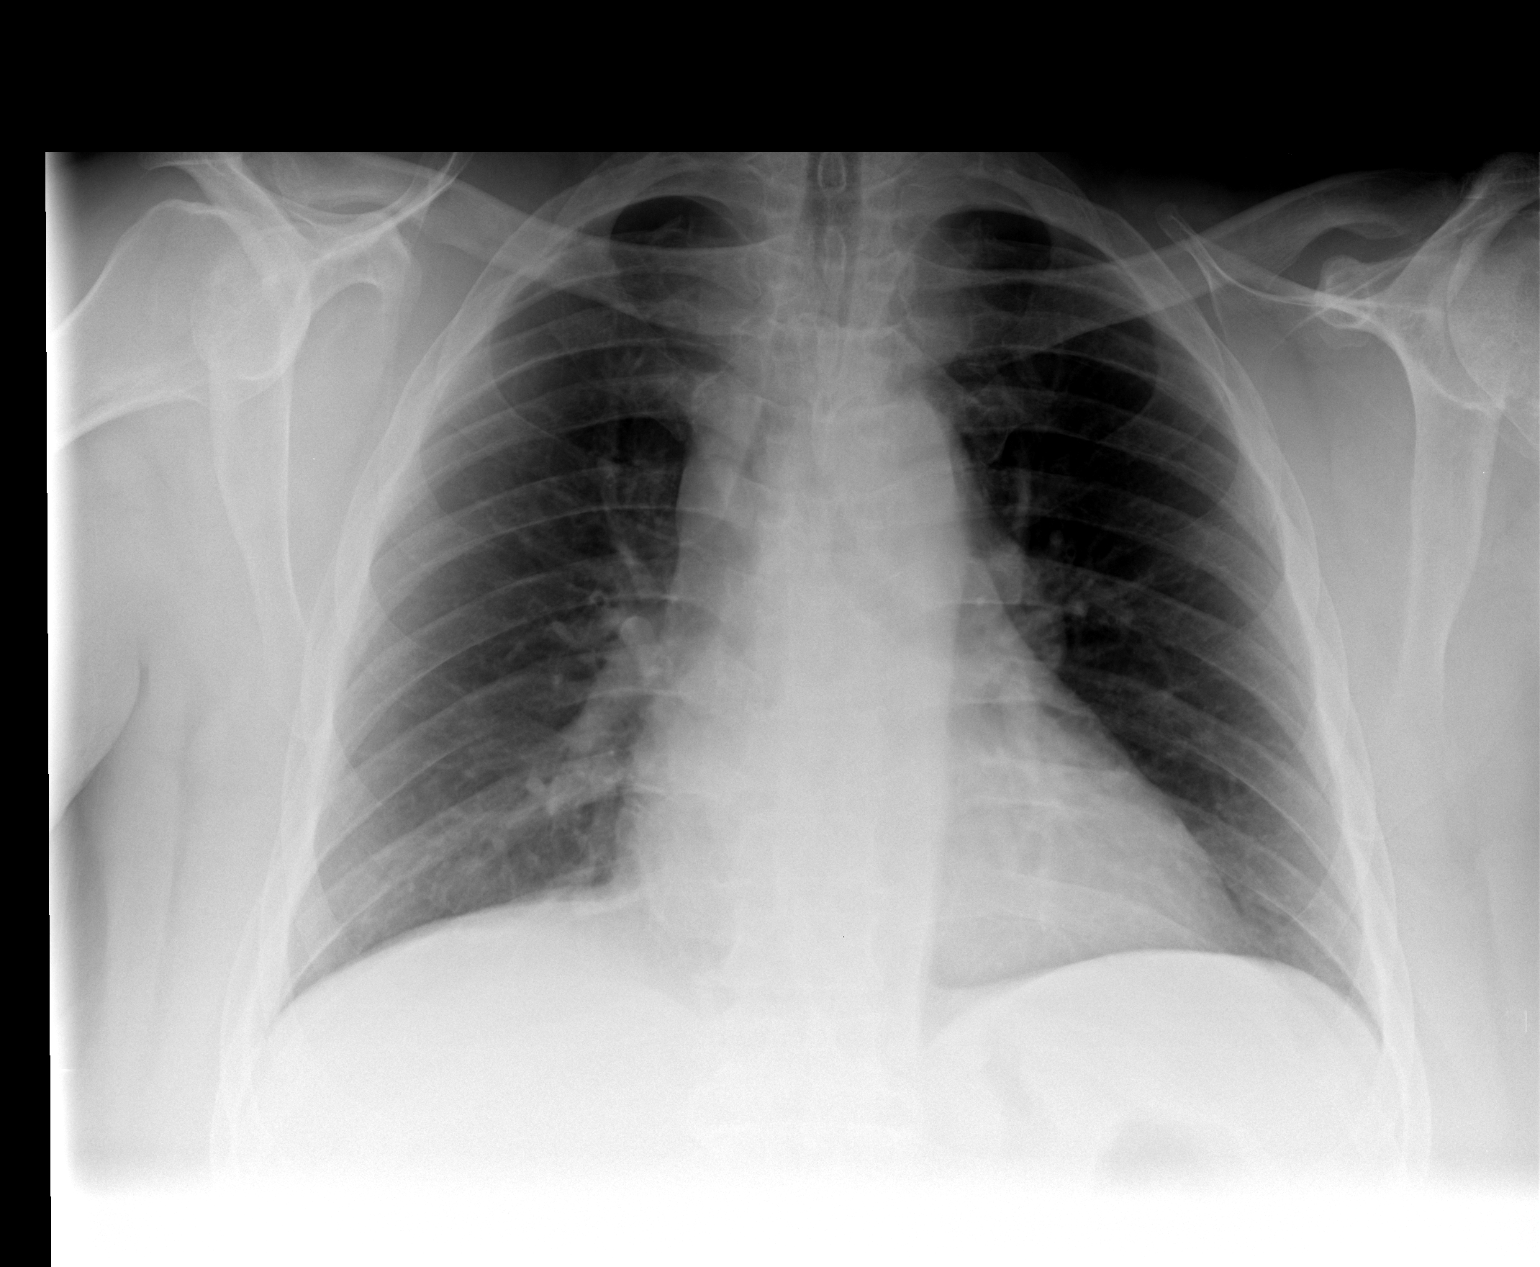

[view not recorded (2 of 2)]
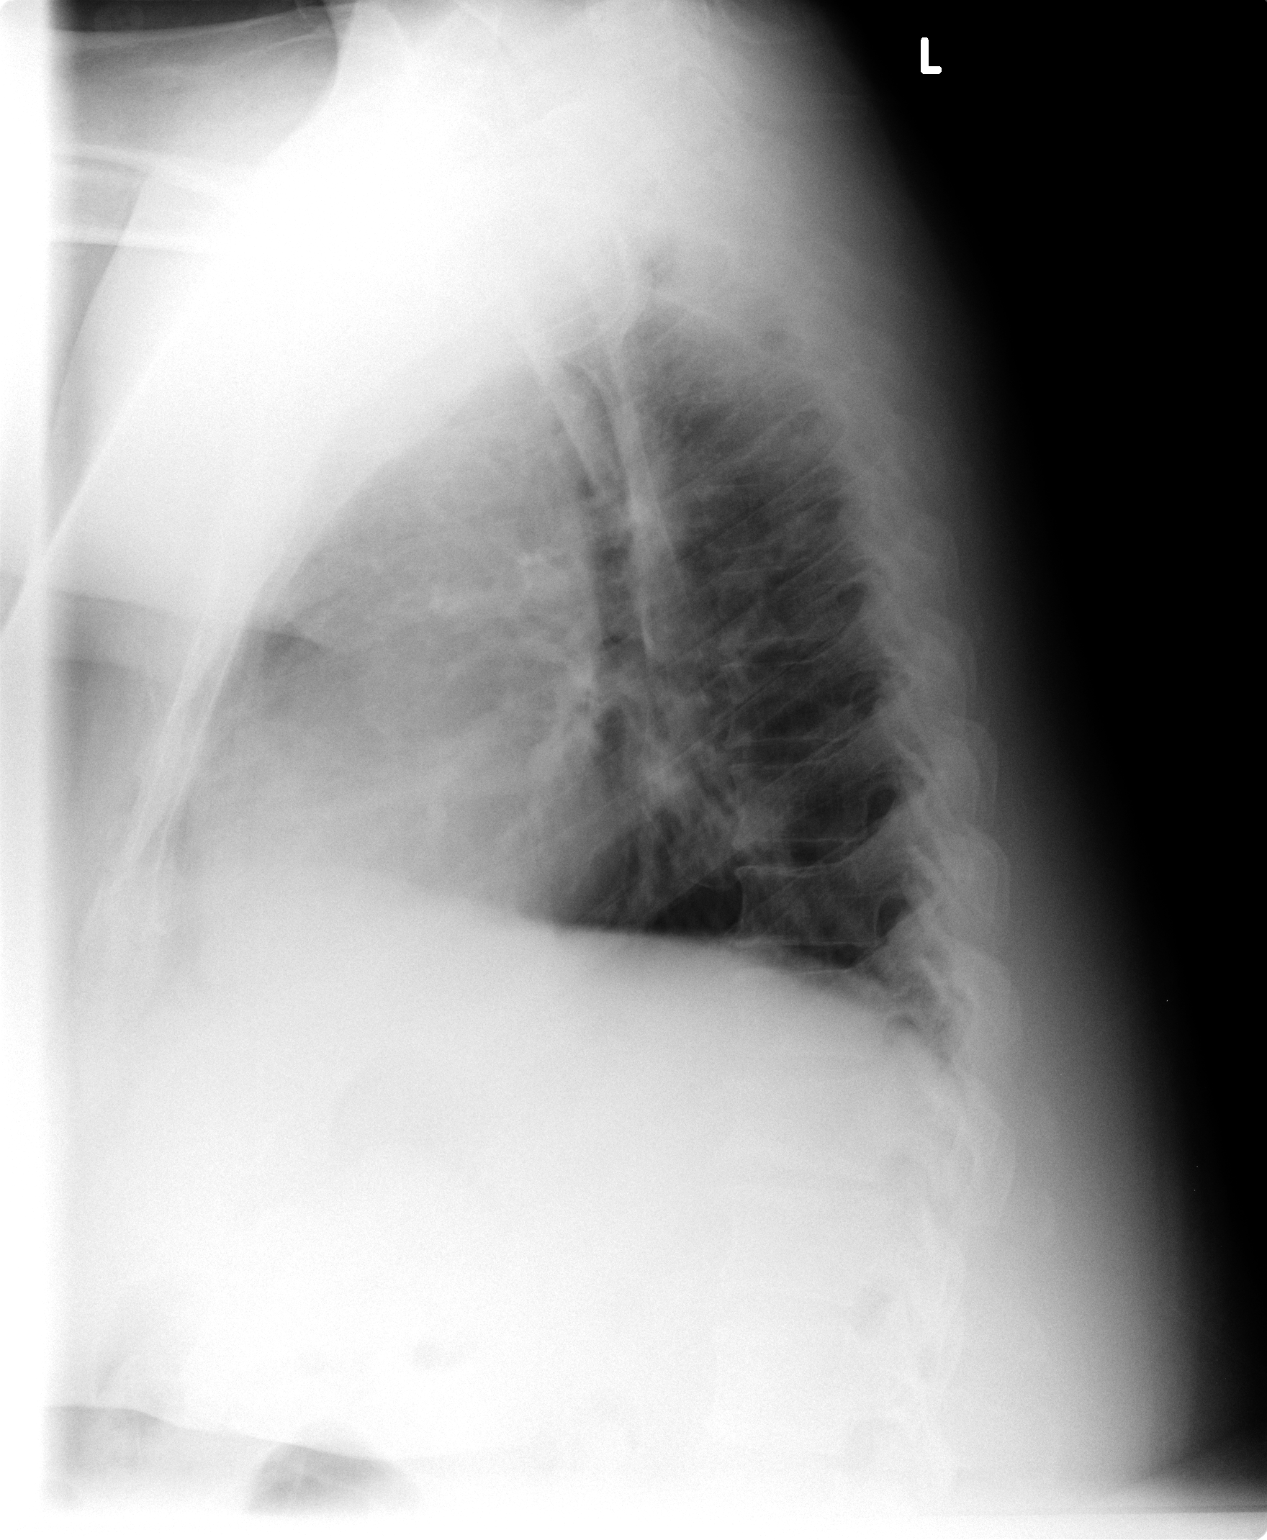

[2 of 2 positions shown; findings below may reference images not displayed]

FINDINGS: Lung volumes are stable and within normal limits.
Cardiac size at the upper limits of normal. Other mediastinal
contours are within normal limits.  Visualized tracheal air column
is within normal limits.  Lung parenchyma stable and clear.  No
pneumothorax or effusion. No acute osseous abnormality identified.
Mild respiratory motion artifact on the lateral view.
IMPRESSION: No acute cardiopulmonary abnormality.

## 2013-10-04 MED ORDER — COLCHICINE 0.6 MG PO TABS: 0.6000 mg | ORAL_TABLET | Freq: Every day | ORAL | Status: DC

## 2013-10-04 MED ORDER — FUROSEMIDE 40 MG PO TABS: 40.0000 mg | ORAL_TABLET | Freq: Every day | ORAL | Status: DC | PRN

## 2013-10-04 MED ORDER — METOPROLOL TARTRATE 50 MG PO TABS: 50.0000 mg | ORAL_TABLET | Freq: Two times a day (BID) | ORAL | Status: DC

## 2013-10-04 MED ORDER — TRAMADOL HCL 50 MG PO TABS: 100.0000 mg | ORAL_TABLET | Freq: Four times a day (QID) | ORAL | Status: DC | PRN

## 2013-10-04 MED ORDER — FEBUXOSTAT 40 MG PO TABS: 120.0000 mg | ORAL_TABLET | Freq: Every day | ORAL | Status: DC

## 2013-10-04 NOTE — ED Provider Notes (Signed)
CSN: [id number]     Arrival date & time 10/04/13  1600 History  This chart was scribed for [name], MD by [name], ED Scribe. This patient was seen in room APA18/APA18 and the patient's care was started at 4:34 PM.    Chief Complaint  Patient presents with  . Knee Pain      The history is provided by the patient. No language interpreter was used.    HPI Comments: MEKIAH Irwin is a 48 y.o. male who presents to the Emergency Department complaining of right knee pain onset 2 days ago. Patient states he woke up with this pain. He denies any injuries. Patient reports history of similar pain and was informed by Dr. [name] that there was fluid in his right knee. Patient states he is usually seen by his PCP (Dr. [name]), but has not been able to make an appointment due to his existing outstanding medical bill. He reports he has an appointment with a "new family practice in [place]" on October 18, 2013. Patient is anxious and there is associated swelling to the right knee and occasional urinary frequency. Patient currently takes Lasix as needed for swelling. He has taken colchicine one time 2 days ago and once yesterday since his pain began with no improvement. He reports ran out of his Metoprolol (2 times per day), and also his Uloric (last dose 2 weeks ago), and has been unable to obtain a prescription for a refill. Patient denies any tobacco use or EtOH consumption. Patient has history of HTN, DM, gout, chronic back pain, BPH, anemia, DVT, neuropathy, arthritis, leg edema, kidney disease. Patient is unemployed and is on disability of for his current health conditions. Patient denies polyuria or polydipsia. Patient states he is dizzy and weak and is concerned about his blood sugar.  PCP Dr [name], changing soon Nephrology Dr Lowanda Foster  Past Medical History  Diagnosis Date  . Hypertension   . Diabetes mellitus   . Gout   . Chronic back pain   . BPH (benign prostatic  hyperplasia)   . Anemia   . DVT (deep venous thrombosis)   . Neuropathy   . Decubitus ulcer     of 2nd toes of both feet.  . Edema leg   . Constipation   . Venous (peripheral) insufficiency   . Neuropathy, diabetic   . Physical deconditioning   . Poor balance   . Difficulty walking   . Lack of coordination   . Arthritis   . Renal insufficiency   . Chronic kidney disease   . Chronic kidney disease (CKD), stage IV (severe)    Past Surgical History  Procedure Laterality Date  . None     Family History  Problem Relation Age of Onset  . Diabetes Mother   . Hypertension Mother   . Heart failure Mother   . Hyperlipidemia Mother   . Cancer Father   . Diabetes Father   . Hypertension Father   . Hyperlipidemia Father    History  Substance Use Topics  . Smoking status: Never Smoker   . Smokeless tobacco: Never Used  . Alcohol Use: No  on disability  Review of Systems  Constitutional: Negative for chills.  Cardiovascular: Positive for leg swelling (right knee).  Genitourinary: Positive for frequency.  Musculoskeletal: Positive for arthralgias (right knee).  All other systems reviewed and are negative.     Allergies  Review of patient's allergies indicates no known allergies.  Home Medications   Prior  to Admission medications   Medication Sig Start Date End Date Taking? Authorizing Provider  Alum & Mag Hydroxide-Simeth (MAGIC MOUTHWASH W/LIDOCAINE) SOLN Gargle and spit 10 mL every 4 hours prn throat pain.    Pharmacy - mix equal parts 06/25/13   Evalee Jefferson, PA-C  AMITRIPTYLINE HCL PO Take 1 tablet by mouth at bedtime as needed (burning  feet).    Historical Provider, MD  amoxicillin (AMOXIL) 500 MG capsule Take 1 capsule (500 mg total) by mouth 3 (three) times daily. 06/29/13   Nat Christen, MD  calcitRIOL (ROCALTROL) 0.25 MCG capsule Take 1 capsule by mouth every Monday, Wednesday, and Friday. 06/15/13   Historical Provider, MD  calcium acetate (PHOSLO) 667 MG capsule  Take 667 mg by mouth 3 (three) times daily with meals.    Historical Provider, MD  cephALEXin (KEFLEX) 500 MG capsule Take 1 capsule (500 mg total) by mouth 4 (four) times daily. 06/10/13   Maudry Diego, MD  Chlorphen-PE-Acetaminophen (CORICIDIN D COLD/FLU/SINUS PO) Take 1 tablet by mouth as needed (cold symptoms).    Historical Provider, MD  colchicine 0.6 MG tablet Take 0.6 mg by mouth as needed (gout). For GOUT PAIN    Historical Provider, MD  dextromethorphan-guaiFENesin (ROBITUSSIN COLD COUGH+ CHEST) 10-100 MG/5ML liquid Take 5 mLs by mouth every 4 (four) hours as needed for cough.    Historical Provider, MD  febuxostat (ULORIC) 40 MG tablet Take 120 mg by mouth daily.     Historical Provider, MD  furosemide (LASIX) 40 MG tablet Take 40 mg by mouth daily as needed for fluid.     Historical Provider, MD  HYDROcodone-acetaminophen (NORCO/VICODIN) 5-325 MG per tablet Take 1 tablet by mouth every 6 (six) hours as needed for moderate pain. 06/10/13   Maudry Diego, MD  metoprolol (LOPRESSOR) 50 MG tablet Take 50 mg by mouth 2 (two) times daily. 11/02/12   Sharyon Cable, MD  metoprolol (LOPRESSOR) 50 MG tablet Take 1 tablet (50 mg total) by mouth 2 (two) times daily. 06/29/13   Nat Christen, MD  sildenafil (VIAGRA) 100 MG tablet Take 100 mg by mouth daily as needed for erectile dysfunction.    Historical Provider, MD   Triage Vitals: BP 160/100  Pulse 63  Temp(Src) 98.4 F (36.9 C) (Oral)  Resp 20  Ht 5\' 11"  (1.803 m)  Wt 277 lb (125.646 kg)  BMI 38.65 kg/m2  SpO2 100%  Vital signs normal except hypertension   Physical Exam  Nursing note and vitals reviewed. Constitutional: He is oriented to person, place, and time. He appears well-developed and well-nourished.  Non-toxic appearance. He does not appear ill. No distress.  obese  HENT:  Head: Normocephalic and atraumatic.  Right Ear: External ear normal.  Left Ear: External ear normal.  Nose: Nose normal. No mucosal edema or  rhinorrhea.  Mouth/Throat: Oropharynx is clear and moist and mucous membranes are normal. No dental abscesses or uvula swelling.  Eyes: Conjunctivae and EOM are normal. Pupils are equal, round, and reactive to light.  Neck: Normal range of motion and full passive range of motion without pain. Neck supple.  Cardiovascular: Normal rate, regular rhythm and normal heart sounds.  Exam reveals no gallop and no friction rub.   No murmur heard. Pulmonary/Chest: Effort normal and breath sounds normal. No respiratory distress. He has no wheezes. He has no rhonchi. He has no rales. He exhibits no tenderness and no crepitus.  Abdominal: Soft. Normal appearance and bowel sounds are normal. He exhibits no  distension. There is no tenderness. There is no rebound and no guarding.  Musculoskeletal: Normal range of motion. He exhibits edema. He exhibits no tenderness.  Moves all extremities well. Mild swelling of right knee. Minimal warmth. No redness.  Neurological: He is alert and oriented to person, place, and time. He has normal strength. No cranial nerve deficit.  Skin: Skin is warm, dry and intact. No rash noted. No erythema. No pallor.  Psychiatric: His behavior is normal. His mood appears anxious. His speech is rapid and/or pressured.  Pt is hard to get history from, he changes topics rapidly and asks multiple questions without allowing me to answer his questions    ED Course  Procedures (including critical care time)  DIAGNOSTIC STUDIES: Oxygen Saturation is 100% on room air, normal by my interpretation.    COORDINATION OF CARE: At [id number] Discussed treatment plan with patient which includes labs. Patient agrees.   Labs Review Results for orders placed during the hospital encounter of 10/04/13  I-STAT CHEM 8, ED      Result Value Ref Range   Sodium 137  137 - 147 mEq/L   Potassium 5.3  3.7 - 5.3 mEq/L   Chloride 113 (*) 96 - 112 mEq/L   BUN 66 (*) 6 - 23 mg/dL   Creatinine, Ser 5.00 (*) 0.50 -  1.35 mg/dL   Glucose, Bld 84  70 - 99 mg/dL   Calcium, Ion 1.00 (*) 1.12 - 1.23 mmol/L   TCO2 17  0 - 100 mmol/L   Hemoglobin 14.6  13.0 - 17.0 g/dL   HCT 43.0  39.0 - 52.0 %   Laboratory interpretation all normal except stable CRF     Imaging Review No results found.   EKG Interpretation None      MDM   Final diagnoses:  Knee pain, acute, right  Chronic renal insufficiency, unspecified stage    New Prescriptions   TRAMADOL (ULTRAM) 50 MG TABLET    Take 2 tablets (100 mg total) by mouth every 6 (six) hours as needed.  Pt given refills for his metoprolol, colchicine, uloric, lasix   Plan discharge    I personally performed the services described in this documentation, which was scribed in my presence. The recorded information has been reviewed and considered.  Rolland Porter, MD, FACEP    [name], MD 10/04/13 [phone]

## 2013-10-04 NOTE — Discharge Instructions (Signed)
Try ice and heat to your knee. You can call Dr Brooke Bonito office to discuss if you need a steroid injection in your knee. You need to get a primary care doctor. You should also be seeing your kidney doctor. You can take acetaminophen 1000 mg 4 times a day with the tramadol.

## 2013-10-04 NOTE — ED Notes (Signed)
Pain rt knee, swelling, No injury

## 2013-10-18 ENCOUNTER — Encounter: Payer: Self-pay | Admitting: Gastroenterology

## 2013-10-18 ENCOUNTER — Ambulatory Visit: Payer: Self-pay | Admitting: Gastroenterology

## 2013-10-25 IMAGING — CR DG CHEST 2V
2 series · 2 of 2 positions shown · non-contrast
Comparison: Chest x-ray 04/02/2012.

CLINICAL DATA: Right-sided chest pain.

CHEST - 2 VIEW

[view not recorded (1 of 2)]
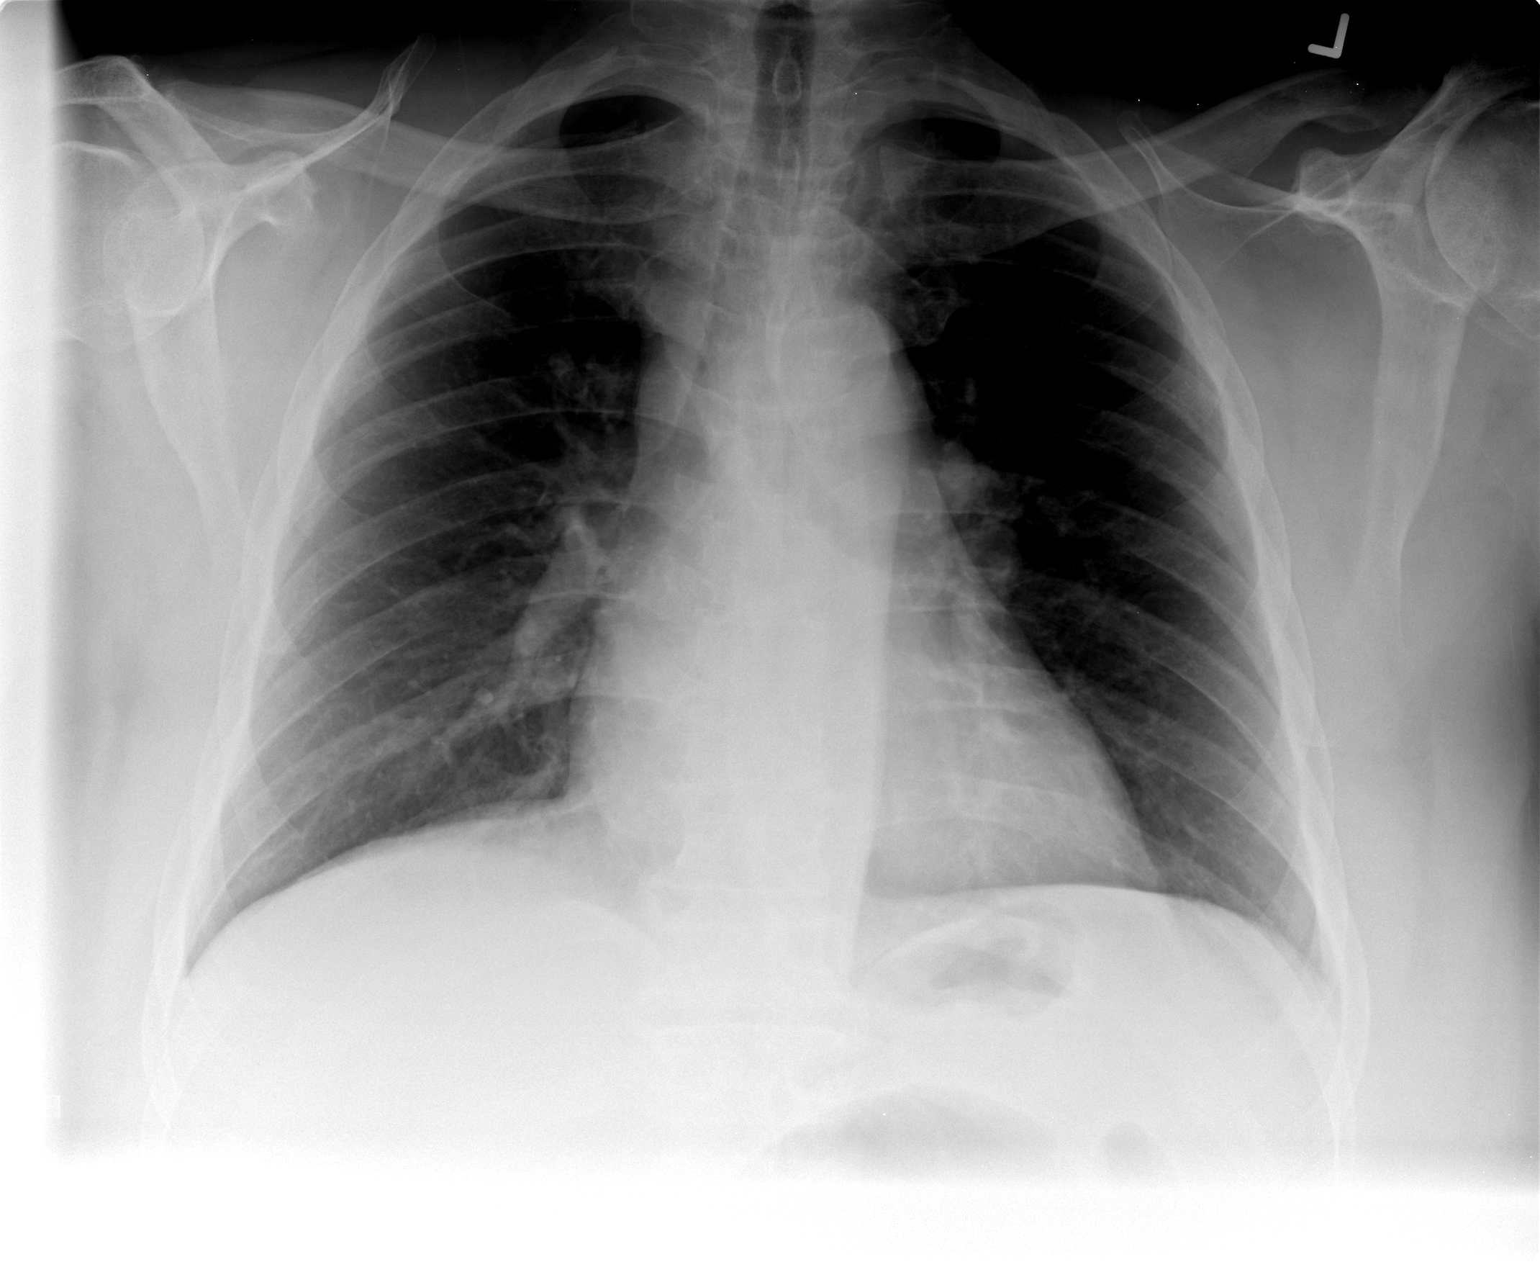

[view not recorded (2 of 2)]
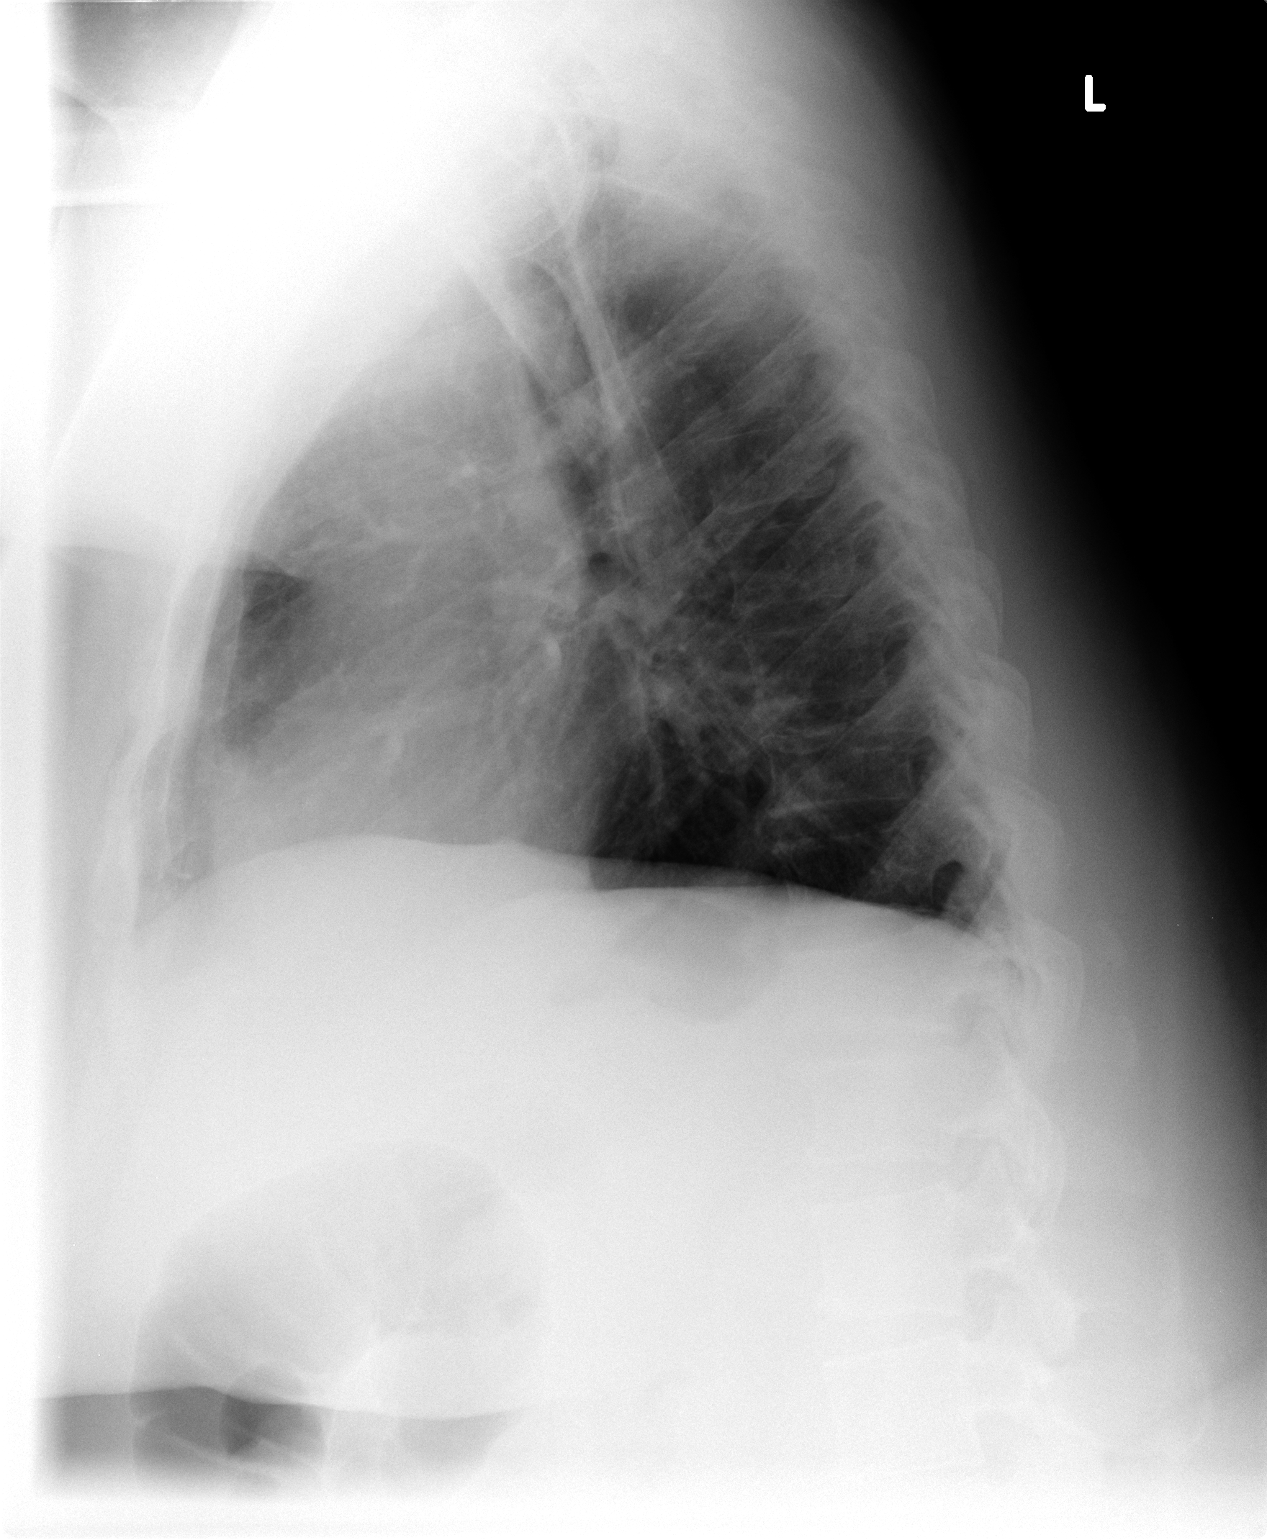

[2 of 2 positions shown; findings below may reference images not displayed]

FINDINGS: Lung volumes are normal.  No consolidative airspace
disease.  No pleural effusions.  No pneumothorax.  No pulmonary
nodule or mass noted.  Pulmonary vasculature and the
cardiomediastinal silhouette are within normal limits.
Atherosclerosis in the thoracic aorta.
IMPRESSION: 1. No radiographic evidence of acute cardiopulmonary disease.
2.  Atherosclerosis.

## 2013-11-04 IMAGING — CR DG CHEST 2V
2 series · 2 of 2 positions shown · non-contrast
Comparison: April 23, 2012.

CLINICAL DATA: Chest pain

CHEST - 2 VIEW

[view not recorded (1 of 2)]
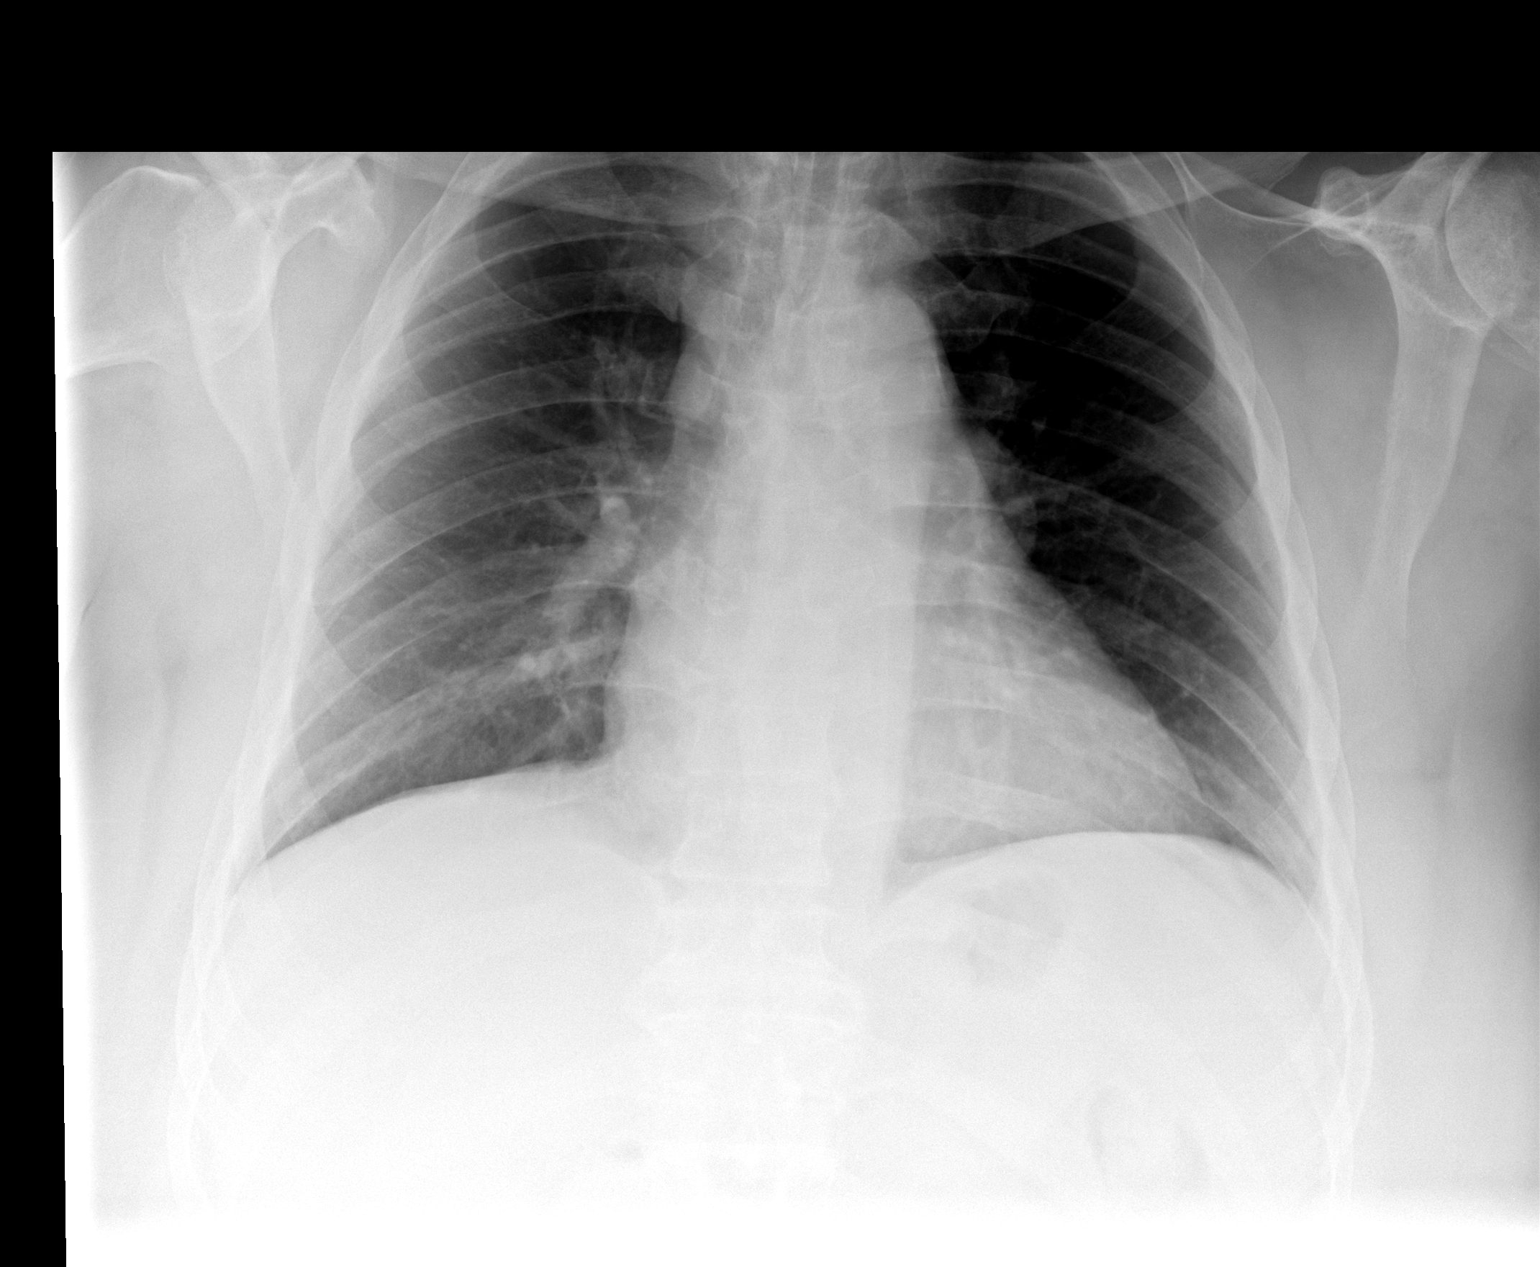

[view not recorded (2 of 2)]
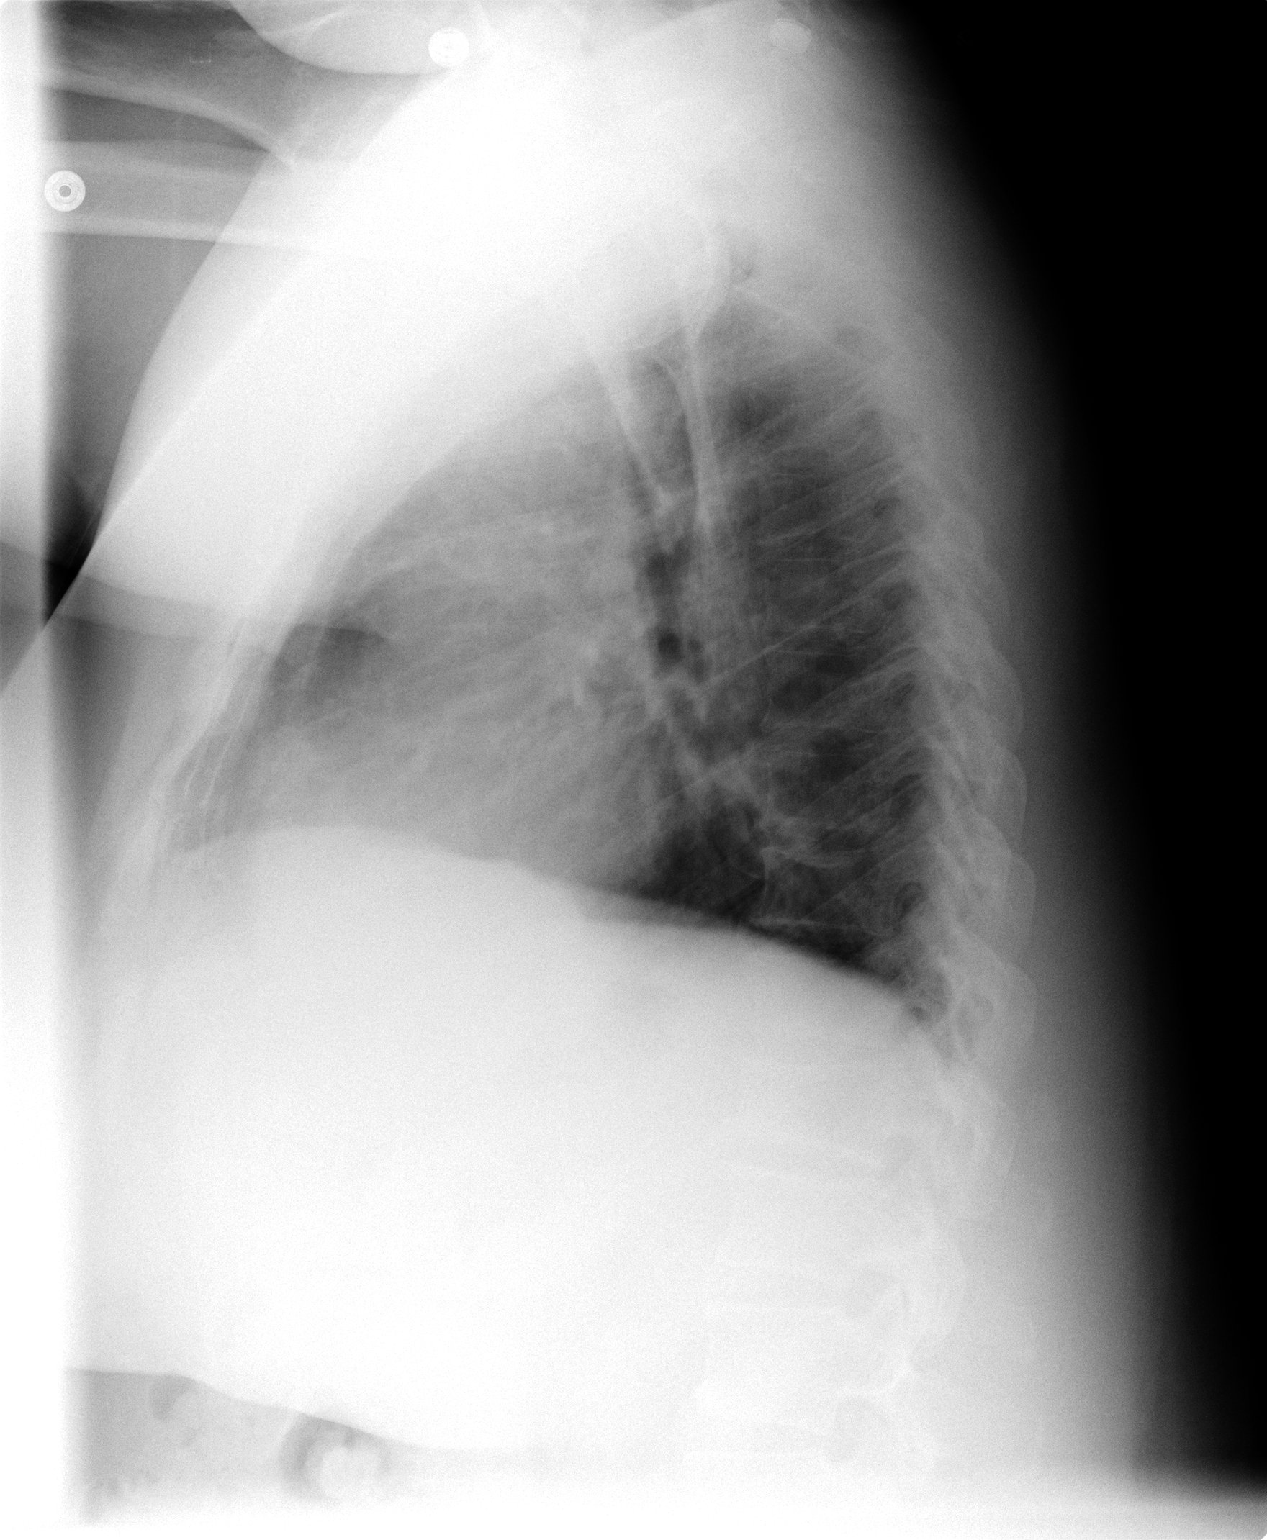

[2 of 2 positions shown; findings below may reference images not displayed]

FINDINGS: Cardiomediastinal silhouette appears normal.  No acute
pulmonary disease is noted.  Bony thorax is intact. No pneumothorax
or pleural effusion is noted.
IMPRESSION: No acute cardiopulmonary abnormality seen.

## 2013-11-17 ENCOUNTER — Ambulatory Visit: Payer: Medicare Other | Admitting: Podiatry

## 2013-11-22 ENCOUNTER — Ambulatory Visit: Payer: Medicare Other | Admitting: Podiatry

## 2013-11-24 ENCOUNTER — Ambulatory Visit: Payer: Medicare Other | Admitting: Podiatry

## 2013-11-29 ENCOUNTER — Ambulatory Visit (HOSPITAL_COMMUNITY): Payer: Medicare Other | Admitting: Physical Therapy

## 2013-12-01 ENCOUNTER — Ambulatory Visit (HOSPITAL_COMMUNITY)
Admission: RE | Admit: 2013-12-01 | Discharge: 2013-12-01 | Disposition: A | Discharge status: Home / Self Care | Payer: Medicare Other | Source: Ambulatory Visit | Attending: Nurse Practitioner | Admitting: Nurse Practitioner

## 2013-12-01 DIAGNOSIS — R269 Unspecified abnormalities of gait and mobility: Secondary | ICD-10-CM | POA: Diagnosis not present

## 2013-12-01 DIAGNOSIS — I10 Essential (primary) hypertension: Secondary | ICD-10-CM | POA: Insufficient documentation

## 2013-12-01 DIAGNOSIS — Z5189 Encounter for other specified aftercare: Secondary | ICD-10-CM | POA: Diagnosis present

## 2013-12-01 DIAGNOSIS — M25561 Pain in right knee: Secondary | ICD-10-CM | POA: Diagnosis not present

## 2013-12-01 NOTE — Evaluation (Signed)
Physical Therapy Evaluation  Patient Details  Name: HERBERT TIBBETTS MRN: [id number] Date of Birth: 06-10-1965  Today's Date: 12/01/2013 Time: 1530-1615 PT Time Calculation (min): 45 min Charge: evaluation             Visit#: 1 of 1  Re-eval:   Assessment Diagnosis: Gait abnormality  Authorization: medicare     Past Medical History:  Past Medical History  Diagnosis Date  . Hypertension   . Diabetes mellitus   . Gout   . Chronic back pain   . BPH (benign prostatic hyperplasia)   . Anemia   . DVT (deep venous thrombosis)   . Neuropathy   . Decubitus ulcer     of 2nd toes of both feet.  . Edema leg   . Constipation   . Venous (peripheral) insufficiency   . Neuropathy, diabetic   . Physical deconditioning   . Poor balance   . Difficulty walking   . Lack of coordination   . Arthritis   . Renal insufficiency   . Chronic kidney disease   . Chronic kidney disease (CKD), stage IV (severe)    Past Surgical History:  Past Surgical History  Procedure Laterality Date  . None      Subjective Symptoms/Limitations Symptoms: Mr. Magro is currently trying to gain employment but he feels that he is not able to secure a job due to the way he walks.  He states that he has not used a wheelchair for over a year. His goal is to be able to walk a mile.   Pertinent History: Mr. Vansomeren is well known to this clinic he has a hx of gout.    How long can you sit comfortably?: no problem  How long can you stand comfortably?: 7:00 How long can you walk comfortably?: 2: 17 seconds  Pain Assessment Currently in Pain?: Yes Pain Score: 7  Pain Location: Knee Pain Orientation: Right;Left Pain Type: Chronic pain Pain Onset: More than a month ago Pain Frequency: Constant Multiple Pain Sites: Yes  Precautions/Restrictions  Precautions Precautions: Fall  Balance Screening Balance Screen Has the patient fallen in the past 6 months: No  Prior Functioning  Prior Function Vocation:  Retired Leisure: Hobbies-no  Cognition/Observation    Sensation/Coordination/Flexibility/Functional Tests Flexibility Thomas: Positive Functional Tests Functional Tests: foto 28 Functional Tests: sit to stand in 30 seconds 4 x   Assessment RLE AROM (degrees) Right Knee Extension: 15 (was -12 in 2013) Right Knee Flexion: 115 RLE Strength Right Hip Flexion: 5/5 Right Hip Extension: 5/5 Right Hip ABduction: 5/5 Right Knee Flexion: 5/5 Right Knee Extension: 5/5 Right Ankle Dorsiflexion: 5/5 LLE AROM (degrees) Left Knee Extension: 25 (was 15 in 2013) Left Knee Flexion: 120 LLE Strength Left Hip Flexion: 5/5 Left Hip Extension: 5/5 Left Hip ABduction: 5/5 Left Knee Flexion: 5/5 Left Knee Extension:  (5-/5) Left Ankle Dorsiflexion: 5/5   Physical Therapy Assessment and Plan PT Assessment and Plan Clinical Impression Statement: Mr. Basciano is well known to this clinic.  He is basically at his prior level of function and therefore his insurance will not cover therapy.  Mr. Claycamp goal is to be able to walk for ten minutes.  His walking ability was tested and he is able to walk for 2:30 .  He was advised to begin walking 3:00 three times a day and every 5 days increase by one minute until he is able to walk ten minutes.   PT Plan: Discharge to [facility], pt given two week free  trial at the [facility]         Problem List Patient Active Problem List   Diagnosis Date Noted  . Morbid obesity 03/30/2013  . RBBB 03/29/2013  . Hyperkalemia 03/28/2013  . Leukocytosis 03/28/2013  . Nausea vomiting and diarrhea 03/28/2013  . BBB (bundle branch block) 03/28/2013  . Hypertension   . Chronic kidney disease (CKD), stage IV (severe)   . Lack of coordination 04/15/2012  . Muscle weakness (generalized) 04/15/2012  . Arthritis, gouty 04/15/2012  . Difficulty in walking 08/18/2011  . Weakness of both legs 08/18/2011  . Poor balance 08/18/2011    PT Plan of Care PT Home Exercise Plan: given    GP Functional Assessment Tool Used: foto Functional Limitation: Mobility: Walking and moving around Mobility: Walking and Moving Around Current Status [id number]): At least 60 percent but less than 80 percent impaired, limited or restricted Mobility: Walking and Moving Around Goal Status [phone]): At least 60 percent but less than 80 percent impaired, limited or restricted Mobility: Walking and Moving Around Discharge Status [phone]): At least 60 percent but less than 80 percent impaired, limited or restricted  Reyan Helle,CINDY 12/01/2013, 4:31 PM  Physician Documentation Your signature is required to indicate approval of the treatment plan as stated above.  Please sign and either send electronically or make a copy of this report for your files and return this physician signed original.   Please mark one 1.__approve of plan  2. ___approve of plan with the following conditions.   ______________________________                                                          _____________________ Physician Signature                                                                                                             Date

## 2013-12-04 IMAGING — CR DG ABDOMEN ACUTE W/ 1V CHEST
4 series · 4 of 4 positions shown · non-contrast
Comparison: Acute abdomen series 02/24/2012 and several intervening
chest x-rays.

CLINICAL DATA: Epigastric abdominal pain.  Current history of
hypertension, diabetes, and chronic kidney disease.

ACUTE ABDOMEN SERIES (ABDOMEN 2 VIEW & CHEST 1 VIEW)

[view not recorded (1 of 4)]
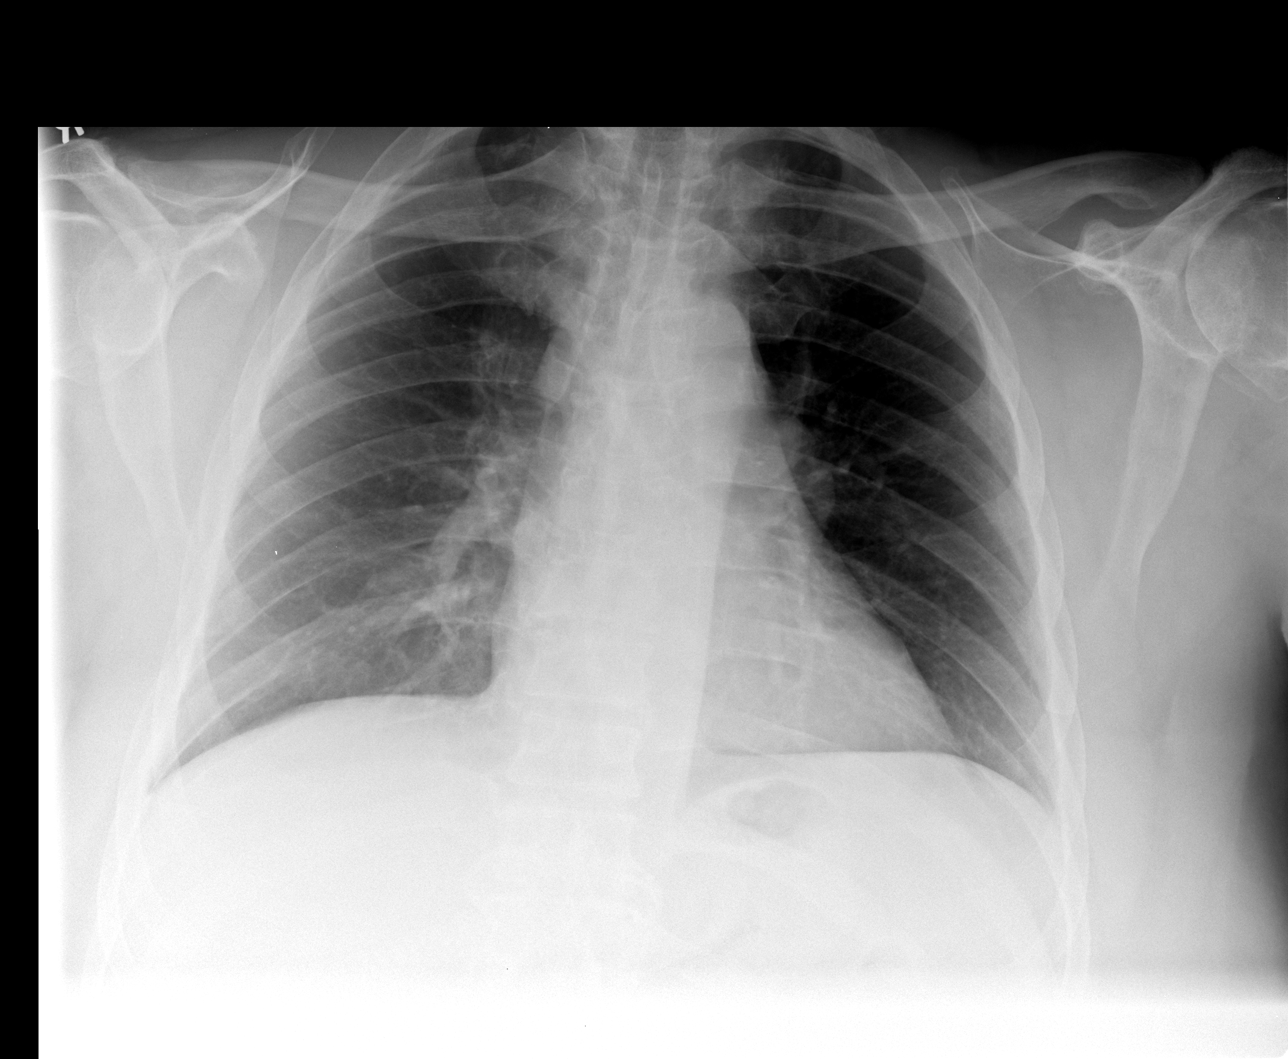

[view not recorded (2 of 4)]
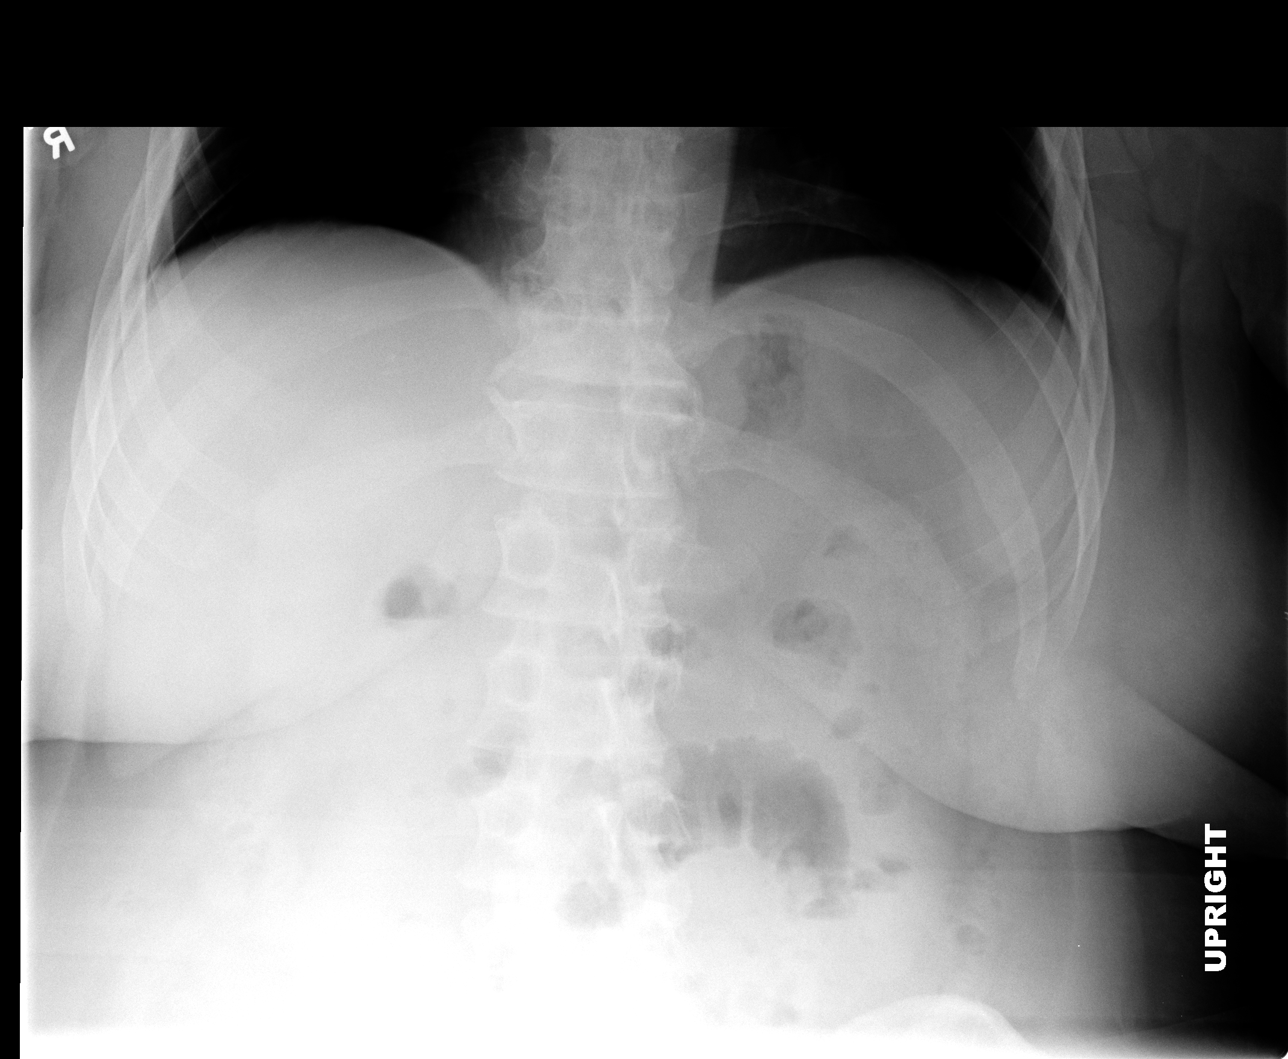

[view not recorded (3 of 4)]
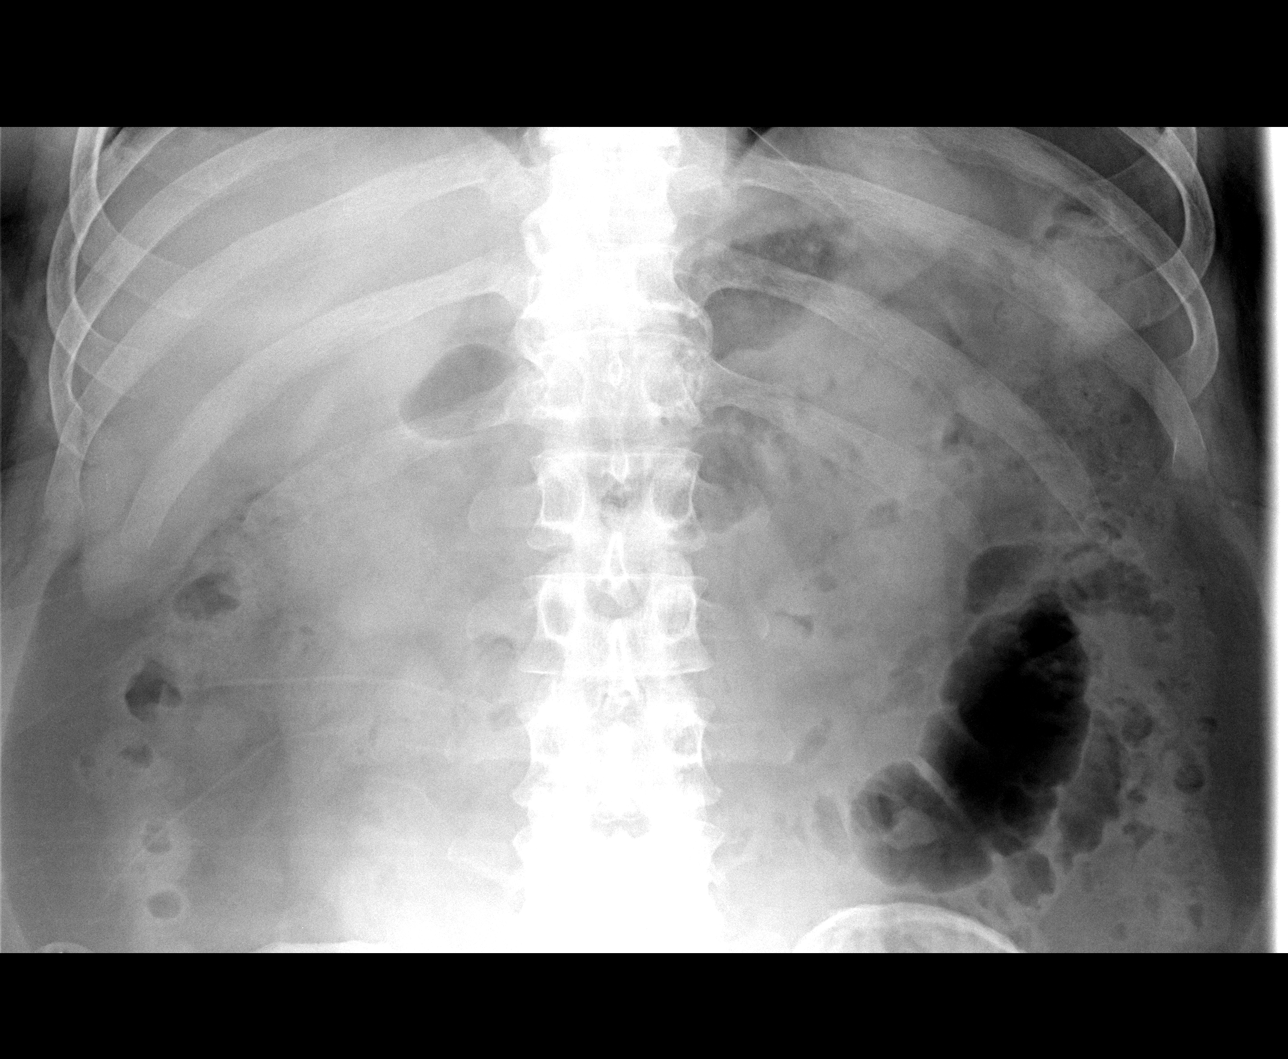

[view not recorded (4 of 4)]
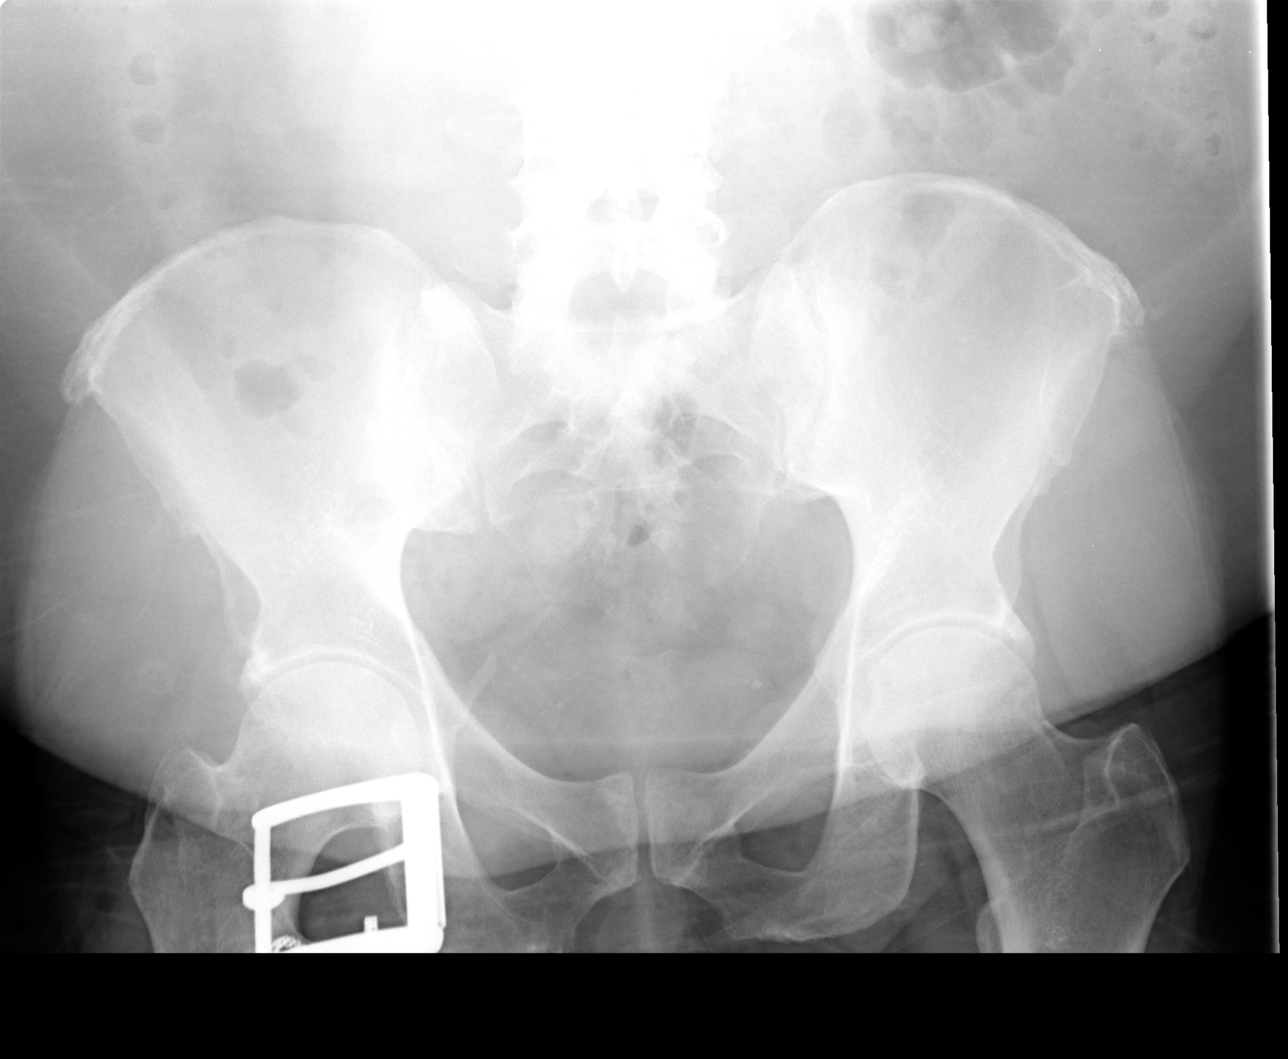

[4 of 4 positions shown; findings below may reference images not displayed]

FINDINGS: Moderate gaseous distention of a solitary loop of jejunum
in the left upper quadrant with air-fluid levels on the erect
image.  No bowel distention elsewhere.  No evidence of free
intraperitoneal air.  Phleboliths in the pelvis.  No visible opaque
urinary tract calculi.

Cardiomediastinal silhouette unremarkable, unchanged.  Lungs clear.
Bronchovascular markings normal.  Pulmonary vascularity normal.  No
pneumothorax.  No pleural effusions.
IMPRESSION: 1.  Early partial small bowel obstruction versus a localized ileus
(as can be seen with enteritis or localized inflammatory disease
such as pancreatitis), with dilation of a solitary loop of jejunum
in the left upper quadrant.  No free intraperitoneal air.
2.  No acute cardiopulmonary disease.

## 2013-12-05 ENCOUNTER — Ambulatory Visit (HOSPITAL_COMMUNITY): Admission: RE | Admit: 2013-12-05 | Payer: Medicare Other | Source: Ambulatory Visit

## 2013-12-08 ENCOUNTER — Ambulatory Visit (INDEPENDENT_AMBULATORY_CARE_PROVIDER_SITE_OTHER): Payer: Medicare Other | Admitting: Podiatry

## 2013-12-08 DIAGNOSIS — B351 Tinea unguium: Secondary | ICD-10-CM

## 2013-12-08 DIAGNOSIS — Q810 Epidermolysis bullosa simplex: Secondary | ICD-10-CM

## 2013-12-08 DIAGNOSIS — M79676 Pain in unspecified toe(s): Secondary | ICD-10-CM

## 2013-12-08 NOTE — Progress Notes (Signed)
Presents today chief complaint of painful elongated toenails.  Objective: Pulses are palpable bilateral nails are thick, yellow dystrophic onychomycosis and painful palpation. Painful callus to the plantar aspect of his second digit right foot.  Assessment: Onychomycosis with pain in limb.debridement of porokeratotic second right. History of diabetes.  Plan: Treatment of nails in thickness and length as covered service secondary to pain.the next time he is in we will get him size for a pair of diabetic shoes.debrided porokeratotic lesion.

## 2013-12-09 ENCOUNTER — Ambulatory Visit (HOSPITAL_COMMUNITY)
Admission: RE | Admit: 2013-12-09 | Discharge: 2013-12-09 | Disposition: A | Discharge status: Home / Self Care | Payer: Medicare Other | Source: Ambulatory Visit | Attending: Nurse Practitioner | Admitting: Nurse Practitioner

## 2013-12-09 DIAGNOSIS — R269 Unspecified abnormalities of gait and mobility: Secondary | ICD-10-CM | POA: Insufficient documentation

## 2013-12-09 DIAGNOSIS — M79603 Pain in arm, unspecified: Secondary | ICD-10-CM

## 2013-12-09 DIAGNOSIS — M25561 Pain in right knee: Secondary | ICD-10-CM | POA: Diagnosis not present

## 2013-12-09 DIAGNOSIS — Z5189 Encounter for other specified aftercare: Secondary | ICD-10-CM | POA: Insufficient documentation

## 2013-12-09 DIAGNOSIS — I10 Essential (primary) hypertension: Secondary | ICD-10-CM | POA: Diagnosis not present

## 2013-12-09 DIAGNOSIS — M109 Gout, unspecified: Secondary | ICD-10-CM

## 2013-12-09 NOTE — Patient Instructions (Addendum)
Gout Gout is when your joints become red, sore, and swell (inflamed). This is caused by the buildup of uric acid crystals in the joints. Uric acid is a chemical that is normally in the blood. If the level of uric acid gets too high in the blood, these crystals form in your joints and tissues. Over time, these crystals can form into masses near the joints and tissues. These masses can destroy bone and cause the bone to look misshapen (deformed). HOME CARE   Do not take aspirin for pain.  Only take medicine as told by your doctor.  Rest the joint as much as you can. When in bed, keep sheets and blankets off painful areas.  Keep the sore joints raised (elevated).  Put warm or cold packs on painful joints. Use of warm or cold packs depends on which works best for you.  Use crutches if the painful joint is in your leg.  Drink enough fluids to keep your pee (urine) clear or pale yellow. Limit alcohol, sugary drinks, and drinks with fructose in them.  Follow your diet instructions. Pay careful attention to how much protein you eat. Include fruits, vegetables, whole grains, and fat-free or low-fat milk products in your daily diet. Talk to your doctor or dietitian about the use of coffee, vitamin C, and cherries. These may help lower uric acid levels.  Keep a healthy body weight. GET HELP RIGHT AWAY IF:   You have watery poop (diarrhea), throw up (vomit), or have any side effects from medicines.  You do not feel better in 24 hours, or you are getting worse.  Your joint becomes suddenly more tender, and you have chills or a fever. MAKE SURE YOU:   Understand these instructions.  Will watch your condition.  Will get help right away if you are not doing well or get worse. Document Released: 10/30/2007 Document Revised: 06/06/2013 Document Reviewed: 09/03/2011 [facility] Patient Information 2015 [employer], [place]. This information is not intended to replace advice given to you by your health care  provider. Make sure you discuss any questions you have with your health care provider.               Home Exercises Program Theraputty Exercises  Do the following exercises 2 times a day using your affected hand.  1. Roll putty into a ball.  2. Make into a pancake.  3. Roll putty into a roll.  4. Pinch along log with first finger and thumb.   5. Make into a ball.  6. Roll it back into a log.   7. Pinch using thumb and side of first finger.  8. Roll into a ball, then flatten into a pancake.  9. Using your fingers, make putty into a mountain.   [name]

## 2013-12-09 NOTE — Therapy (Addendum)
Occupational Therapy Evaluation  Patient Details  Name: Jack Irwin MRN: [id number] Date of Birth: October 10, 1965  Encounter Date: 12/09/2013      OT End of Session - 12/09/13 1611    Visit Number 1   Number of Visits 1   Authorization Type Medicare part A and B   Authorization Time Period before 10th visit   Authorization - Visit Number 1   Authorization - Number of Visits 10   OT Start Time [id number]   OT Stop Time 1700   OT Time Calculation (min) 40 min   Activity Tolerance Patient tolerated treatment well      Past Medical History  Diagnosis Date  . Hypertension   . Diabetes mellitus   . Gout   . Chronic back pain   . BPH (benign prostatic hyperplasia)   . Anemia   . DVT (deep venous thrombosis)   . Neuropathy   . Decubitus ulcer     of 2nd toes of both feet.  . Edema leg   . Constipation   . Venous (peripheral) insufficiency   . Neuropathy, diabetic   . Physical deconditioning   . Poor balance   . Difficulty walking   . Lack of coordination   . Arthritis   . Renal insufficiency   . Chronic kidney disease   . Chronic kidney disease (CKD), stage IV (severe)     Past Surgical History  Procedure Laterality Date  . None      There were no vitals taken for this visit.  Visit Diagnosis:  Arthritis, gouty  Pain of upper extremity, unspecified laterality      Subjective Assessment - 12/09/13 1600    Symptoms S: I just want to be able to grip things in my hands. I want to be able to get a job and I feel like if they see my hands like this they won't think I can do the job.    Pertinent History Patient is a 48 y/o male s/p gout flair up in bilateral hands. Patient is well known to this clinic as he was seen for therapy previously for gout. Patient was discharged due to lack of carry over of HEP and poor attendance. Patient wants to be able to get a job and feels that he won't get a job do to how his hands look. He is looking to increase his ROM of hands. Dr.  Izola Price has referred patient to occupational therapy for evaluation and treatment.    Limitations Decreased ROM of bilateral hands.    Special Tests FOTO score: 38/100   Patient Stated Goals To increase AROM of bilateral hands.       12/09/13 1524  Pain Assessment  Currently in Pain? Yes  Pain Score 7  Pain Location Hand  Pain Orientation Right;Left        [facility] OT Assessment - 12/09/13 1604    Assessment   Diagnosis Gout of bilateral hands   Precautions   Precautions None   Balance Screen   Has the patient fallen in the past 6 months No   Home  Environment   Family/patient expects to be discharged to: Private residence   Living Arrangements Spouse/significant other   Lives With Spouse   Prior Function   Level of Independence Needs assistance with ADLs   Dressing --  unable to do buttons on clothing   Vocation Unemployed   ADL   ADL comments Pt reports that he is unable to do buttons on clothing, turn  the faucet on the sink, and has difficulty using tools and write due to gout.    Written Expression   Dominant Hand Right   Vision - History   Baseline Vision No visual deficits   Cognition   Overall Cognitive Status Within Functional Limits for tasks assessed   Sensation   Additional Comments Pt reports numbness in bilateral fingers.   Coordination   Gross Motor Movements are Fluid and Coordinated No   Fine Motor Movements are Fluid and Coordinated No   Strength   Overall Strength Within functional limits for tasks performed   Overall Strength Comments AROM WFL in all ranges.   Right Shoulder Flexion 5/5   Right Shoulder ABduction 5/5   Right Shoulder Internal Rotation 5/5   Right Shoulder External Rotation 5/5   Left Shoulder Flexion 5/5   Left Shoulder ABduction 5/5   Left Shoulder Internal Rotation 5/5   Left Shoulder External Rotation 5/5   Grip (lbs) 35   Right Hand Lateral Pinch 16 lbs   Right Hand 3 Point Pinch 11 lbs   Grip (lbs) 32   Left Hand  Lateral Pinch 15 lbs   Left Hand 3 Point Pinch 9 lbs            Education - 12/27/13 1611    Education provided Yes   Education Details theraputty exercises and gout information, rice sock DIY instructions.   Education Details Patient   Methods Explanation;Handout   Comprehension Verbalized understanding          OT Short Term Goals - 12/27/2013 1616    OT SHORT TERM GOAL #1   Title Patient will be educated on HEP.   Time 1   Period Days   Status Achieved            Plan - 12/27/2013 1612    Clinical Impression Statement A: Patient is a 48 y/o s/p gout flair up in bilateral hands causing decreased hand AROM and pain resulting in difficulty completing basic daily tasks. Pt was assessed and his strength in bilateral hands is better than at his previous discharge. Patient was educated on chronic gout and ways to help control it. Patient was given handout. Patient was given HEP of theraputty. Discussed that with chronic gout he will not gain back his normal hand ROM due to deformities. Disccused ways to adapt patient's environment to increase independence with daily tasks. Additional OT services not recommended at this time.    OT Frequency Min 1X/week   OT Duration --  1 week   OT Plan P: 1 time eval only.          G-Codes - 12-27-13 1618    Functional Assessment Tool Used FOTO score: 38/100 (62% impaired)   Functional Limitation Carrying, moving and handling objects   Carrying, Moving and Handling Objects Current Status [phone]) At least 60 percent but less than 80 percent impaired, limited or restricted   Carrying, Moving and Handling Objects Goal Status [id number]) At least 60 percent but less than 80 percent impaired, limited or restricted   Carrying, Moving and Handling Objects Discharge Status [phone]) At least 60 percent but less than 80 percent impaired, limited or restricted      Problem List Patient Active Problem List   Diagnosis Date Noted  . Morbid obesity  03/30/2013  . RBBB 03/29/2013  . Hyperkalemia 03/28/2013  . Leukocytosis 03/28/2013  . Nausea vomiting and diarrhea 03/28/2013  . BBB (bundle branch block) 03/28/2013  . Hypertension   .  Chronic kidney disease (CKD), stage IV (severe)   . Lack of coordination 04/15/2012  . Muscle weakness (generalized) 04/15/2012  . Arthritis, gouty 04/15/2012  . Difficulty in walking 08/18/2011  . Weakness of both legs 08/18/2011  . Poor balance 08/18/2011   Ailene Ravel, OTR/L,CBIS   12/09/2013, 4:19 PM

## 2013-12-13 ENCOUNTER — Emergency Department (HOSPITAL_COMMUNITY)
Admission: EM | Admit: 2013-12-13 | Discharge: 2013-12-13 | Disposition: A | Discharge status: Home / Self Care | Payer: Medicare Other | Attending: Emergency Medicine | Admitting: Emergency Medicine

## 2013-12-13 ENCOUNTER — Encounter (HOSPITAL_COMMUNITY): Payer: Self-pay | Admitting: Emergency Medicine

## 2013-12-13 DIAGNOSIS — Z79899 Other long term (current) drug therapy: Secondary | ICD-10-CM | POA: Diagnosis not present

## 2013-12-13 DIAGNOSIS — Z87448 Personal history of other diseases of urinary system: Secondary | ICD-10-CM | POA: Diagnosis not present

## 2013-12-13 DIAGNOSIS — Z86718 Personal history of other venous thrombosis and embolism: Secondary | ICD-10-CM | POA: Diagnosis not present

## 2013-12-13 DIAGNOSIS — M545 Low back pain, unspecified: Secondary | ICD-10-CM

## 2013-12-13 DIAGNOSIS — E1140 Type 2 diabetes mellitus with diabetic neuropathy, unspecified: Secondary | ICD-10-CM | POA: Diagnosis not present

## 2013-12-13 DIAGNOSIS — G8929 Other chronic pain: Secondary | ICD-10-CM | POA: Insufficient documentation

## 2013-12-13 DIAGNOSIS — N184 Chronic kidney disease, stage 4 (severe): Secondary | ICD-10-CM | POA: Diagnosis not present

## 2013-12-13 DIAGNOSIS — I129 Hypertensive chronic kidney disease with stage 1 through stage 4 chronic kidney disease, or unspecified chronic kidney disease: Secondary | ICD-10-CM | POA: Insufficient documentation

## 2013-12-13 MED ORDER — TRAMADOL HCL 50 MG PO TABS: 50.0000 mg | ORAL_TABLET | Freq: Four times a day (QID) | ORAL | Status: DC | PRN

## 2013-12-13 NOTE — Discharge Instructions (Signed)

## 2013-12-13 NOTE — ED Notes (Signed)
Patient c/o lower back pain. Patient denies any injury. Patient seen by PCP today for back pain and told to see chiropractor. Per patient has appointment for tomorrow but cant take the pain.

## 2013-12-13 NOTE — ED Provider Notes (Signed)
CSN: [id number]     Arrival date & time 12/13/13  1424 History   First MD Initiated Contact with Patient 12/13/13 1432     Chief Complaint  Patient presents with  . Back Pain     (Consider location/radiation/quality/duration/timing/severity/associated sxs/prior Treatment) HPI Comments: Pt c/o lower back pain that started 4 days ago. No injury. No numbness or weakness. Hasn't taken anything for the pain. Denies fever of dysuria. Was seen by his pcp today and was told that he needed to follow up with a chiropractor. Pt states that she wouldn't give him narcotic medication today.pt hasn't tried any medications at home today. d  The history is provided by the patient. No language interpreter was used.    Past Medical History  Diagnosis Date  . Hypertension   . Diabetes mellitus   . Gout   . Chronic back pain   . BPH (benign prostatic hyperplasia)   . Anemia   . DVT (deep venous thrombosis)   . Neuropathy   . Decubitus ulcer     of 2nd toes of both feet.  . Edema leg   . Constipation   . Venous (peripheral) insufficiency   . Neuropathy, diabetic   . Physical deconditioning   . Poor balance   . Difficulty walking   . Lack of coordination   . Arthritis   . Renal insufficiency   . Chronic kidney disease   . Chronic kidney disease (CKD), stage IV (severe)    Past Surgical History  Procedure Laterality Date  . None     Family History  Problem Relation Age of Onset  . Diabetes Mother   . Hypertension Mother   . Heart failure Mother   . Hyperlipidemia Mother   . Cancer Father   . Diabetes Father   . Hypertension Father   . Hyperlipidemia Father    History  Substance Use Topics  . Smoking status: Never Smoker   . Smokeless tobacco: Never Used  . Alcohol Use: No    Review of Systems  All other systems reviewed and are negative.     Allergies  Review of patient's allergies indicates no known allergies.  Home Medications   Prior to Admission medications    Medication Sig Start Date End Date Taking? Authorizing Provider  febuxostat (ULORIC) 40 MG tablet Take 3 tablets (120 mg total) by mouth daily. 10/04/13  Yes [name], MD  metoprolol tartrate (LOPRESSOR) 25 MG tablet Take 37.5 mg by mouth 2 (two) times daily.  11/22/13  Yes Historical Provider, MD  Alum & Mag Hydroxide-Simeth (MAGIC MOUTHWASH W/LIDOCAINE) SOLN Gargle and spit 10 mL every 4 hours prn throat pain.    Pharmacy - mix equal parts 06/25/13   Evalee Jefferson, PA-C  amitriptyline (ELAVIL) 25 MG tablet Take 25 mg by mouth at bedtime as needed (burning).    Historical Provider, MD  AMITRIPTYLINE HCL PO Take 1 tablet by mouth at bedtime as needed (burning  feet).    Historical Provider, MD  calcitRIOL (ROCALTROL) 0.25 MCG capsule Take 1 capsule by mouth daily.  06/15/13   Historical Provider, MD  calcium acetate (PHOSLO) 667 MG capsule Take 667 mg by mouth 2 (two) times daily.     Historical Provider, MD  colchicine (COLCRYS) 0.6 MG tablet Take 1 tablet (0.6 mg total) by mouth daily. Patient taking differently: Take 0.6 mg by mouth daily as needed. For gout 10/04/13   [name], MD  furosemide (LASIX) 40 MG tablet Take 1  tablet (40 mg total) by mouth daily as needed for fluid. 10/04/13   [name], MD  metoprolol (LOPRESSOR) 50 MG tablet Take 1 tablet (50 mg total) by mouth 2 (two) times daily. Patient not taking: Reported on 12/13/2013 10/04/13   [name], MD  sildenafil (VIAGRA) 100 MG tablet Take 100 mg by mouth daily as needed for erectile dysfunction.    Historical Provider, MD  traMADol (ULTRAM) 50 MG tablet Take 2 tablets (100 mg total) by mouth every 6 (six) hours as needed. Patient not taking: Reported on 12/13/2013 10/04/13   [name], MD   BP 155/107 mmHg  Pulse 66  Temp(Src) 98 F (36.7 C) (Oral)  Resp 16  Ht 5\' 11"  (1.803 m)  Wt 280 lb (127.007 kg)  BMI 39.07 kg/m2  SpO2 100% Physical Exam  Constitutional: He is oriented to person, place, and time. He appears  well-developed and well-nourished.  Cardiovascular: Normal rate and regular rhythm.   Pulmonary/Chest: Effort normal and breath sounds normal.  Abdominal: Soft. Bowel sounds are normal. There is no tenderness.  Musculoskeletal: Normal range of motion.  Right lumbar paraspinal tendernes  Neurological: He is alert and oriented to person, place, and time. He exhibits normal muscle tone. Coordination normal.  Skin: Skin is warm and dry.  Nursing note and vitals reviewed.   ED Course  Procedures (including critical care time) Labs Review Labs Reviewed - No data to display  Imaging Review No results found.   EKG Interpretation None      MDM   Final diagnoses:  Right-sided low back pain without sciatica   Will treat with ultram. Pt is okay to follow up with pcp and whomever they referred him to. Pt is neurologically intact. dont think any imaging is needed at this time    Glendell Docker, NP 12/13/13 [place], MD 12/13/13 1537

## 2013-12-14 NOTE — Addendum Note (Signed)
Encounter addended by: Debby Bud, OT on: 12/14/2013  8:28 AM<BR>     Documentation filed: Clinical Notes

## 2014-01-04 ENCOUNTER — Encounter (HOSPITAL_COMMUNITY): Payer: Self-pay | Admitting: *Deleted

## 2014-01-04 ENCOUNTER — Emergency Department (HOSPITAL_COMMUNITY)
Admission: EM | Admit: 2014-01-04 | Discharge: 2014-01-04 | Disposition: A | Discharge status: Home / Self Care | Payer: Medicare Other | Attending: Emergency Medicine | Admitting: Emergency Medicine

## 2014-01-04 DIAGNOSIS — N184 Chronic kidney disease, stage 4 (severe): Secondary | ICD-10-CM | POA: Insufficient documentation

## 2014-01-04 DIAGNOSIS — E669 Obesity, unspecified: Secondary | ICD-10-CM | POA: Diagnosis not present

## 2014-01-04 DIAGNOSIS — Z791 Long term (current) use of non-steroidal anti-inflammatories (NSAID): Secondary | ICD-10-CM | POA: Diagnosis not present

## 2014-01-04 DIAGNOSIS — Z872 Personal history of diseases of the skin and subcutaneous tissue: Secondary | ICD-10-CM | POA: Diagnosis not present

## 2014-01-04 DIAGNOSIS — M1990 Unspecified osteoarthritis, unspecified site: Secondary | ICD-10-CM | POA: Insufficient documentation

## 2014-01-04 DIAGNOSIS — Z79899 Other long term (current) drug therapy: Secondary | ICD-10-CM | POA: Insufficient documentation

## 2014-01-04 DIAGNOSIS — E1140 Type 2 diabetes mellitus with diabetic neuropathy, unspecified: Secondary | ICD-10-CM | POA: Diagnosis not present

## 2014-01-04 DIAGNOSIS — Z862 Personal history of diseases of the blood and blood-forming organs and certain disorders involving the immune mechanism: Secondary | ICD-10-CM | POA: Insufficient documentation

## 2014-01-04 DIAGNOSIS — I129 Hypertensive chronic kidney disease with stage 1 through stage 4 chronic kidney disease, or unspecified chronic kidney disease: Secondary | ICD-10-CM | POA: Insufficient documentation

## 2014-01-04 DIAGNOSIS — R2241 Localized swelling, mass and lump, right lower limb: Secondary | ICD-10-CM | POA: Diagnosis present

## 2014-01-04 DIAGNOSIS — G8929 Other chronic pain: Secondary | ICD-10-CM | POA: Diagnosis not present

## 2014-01-04 DIAGNOSIS — Z87448 Personal history of other diseases of urinary system: Secondary | ICD-10-CM | POA: Insufficient documentation

## 2014-01-04 DIAGNOSIS — Z86718 Personal history of other venous thrombosis and embolism: Secondary | ICD-10-CM | POA: Insufficient documentation

## 2014-01-04 DIAGNOSIS — M109 Gout, unspecified: Secondary | ICD-10-CM | POA: Diagnosis not present

## 2014-01-04 DIAGNOSIS — Z8719 Personal history of other diseases of the digestive system: Secondary | ICD-10-CM | POA: Insufficient documentation

## 2014-01-04 LAB — URINALYSIS, ROUTINE W REFLEX MICROSCOPIC
Bilirubin Urine: NEGATIVE
Glucose, UA: NEGATIVE mg/dL
Ketones, ur: NEGATIVE mg/dL
LEUKOCYTES UA: NEGATIVE
Nitrite: NEGATIVE
Protein, ur: 100 mg/dL — AB
Specific Gravity, Urine: 1.02 (ref 1.005–1.030)
Urobilinogen, UA: 1 mg/dL (ref 0.0–1.0)
pH: 6 (ref 5.0–8.0)

## 2014-01-04 LAB — BASIC METABOLIC PANEL
Anion gap: 14 (ref 5–15)
BUN: 68 mg/dL — AB (ref 6–23)
CO2: 20 mEq/L (ref 19–32)
Calcium: 8.5 mg/dL (ref 8.4–10.5)
Chloride: 100 mEq/L (ref 96–112)
Creatinine, Ser: 5.93 mg/dL — ABNORMAL HIGH (ref 0.50–1.35)
GFR calc non Af Amer: 10 mL/min — ABNORMAL LOW (ref >90)
GFR, EST AFRICAN AMERICAN: 12 mL/min — AB (ref >90)
Glucose, Bld: 85 mg/dL (ref 70–99)
POTASSIUM: 5.7 meq/L — AB (ref 3.7–5.3)
SODIUM: 134 meq/L — AB (ref 137–147)

## 2014-01-04 LAB — CBC WITH DIFFERENTIAL/PLATELET
BASOS ABS: 0 10*3/uL (ref 0.0–0.1)
Basophils Relative: 0 % (ref 0–1)
Eosinophils Absolute: 0.1 10*3/uL (ref 0.0–0.7)
Eosinophils Relative: 1 % (ref 0–5)
HEMATOCRIT: 33.8 % — AB (ref 39.0–52.0)
HEMOGLOBIN: 11.2 g/dL — AB (ref 13.0–17.0)
LYMPHS PCT: 20 % (ref 12–46)
Lymphs Abs: 2.5 10*3/uL (ref 0.7–4.0)
MCH: 27.8 pg (ref 26.0–34.0)
MCHC: 33.1 g/dL (ref 30.0–36.0)
MCV: 83.9 fL (ref 78.0–100.0)
MONO ABS: 1.1 10*3/uL — AB (ref 0.1–1.0)
Monocytes Relative: 9 % (ref 3–12)
Neutro Abs: 8.8 10*3/uL — ABNORMAL HIGH (ref 1.7–7.7)
Neutrophils Relative %: 70 % (ref 43–77)
Platelets: 196 10*3/uL (ref 150–400)
RBC: 4.03 MIL/uL — ABNORMAL LOW (ref 4.22–5.81)
RDW: 15.5 % (ref 11.5–15.5)
WBC: 12.6 10*3/uL — AB (ref 4.0–10.5)

## 2014-01-04 LAB — SEDIMENTATION RATE: SED RATE: 90 mm/h — AB (ref 0–16)

## 2014-01-04 LAB — URINE MICROSCOPIC-ADD ON

## 2014-01-04 MED ORDER — OXYCODONE-ACETAMINOPHEN 5-325 MG PO TABS: 1.0000 | ORAL_TABLET | ORAL | Status: DC | PRN

## 2014-01-04 MED ORDER — HYDROMORPHONE HCL 1 MG/ML IJ SOLN
1.0000 mg | Freq: Once | INTRAMUSCULAR | Status: AC
  Administered 2014-01-04: 1 mg via INTRAMUSCULAR
  Filled 2014-01-04: qty 1

## 2014-01-04 MED ORDER — PREDNISONE 50 MG PO TABS: ORAL_TABLET | ORAL | Status: DC

## 2014-01-04 NOTE — ED Notes (Signed)
Pt states he has multiple complaints. Co back pain, arm pain, swollen ankles, unable to urinate, and constipated. All symptoms started 2 days prior.

## 2014-01-04 NOTE — ED Provider Notes (Signed)
CSN: [id number]     Arrival date & time 01/04/14  1255 History  This chart was scribed for Nat Christen, MD by Rayfield Citizen, ED Scribe. This patient was seen in room APA06/[facility]06 and the patient's care was started at 2:32 PM.    Chief Complaint  Patient presents with  . Leg Swelling  . Back Pain   The history is provided by the patient. No language interpreter was used.     HPI Comments: Jack Irwin is a 48 y.o. male who presents to the Emergency Department complaining of approximately 2 days of dysuria and abdominal pain. Patient reports that he has not had a BM in three days. He also reports foot/ankle swelling and a tender area on his right forearm. No fever, chills, chest pain, dyspnea  He states that his nephrologist wants to put in a shunt in to begin dialysis; he states he has about "about 14% kidney function" at this time.   Past Medical History  Diagnosis Date  . Hypertension   . Diabetes mellitus   . Gout   . Chronic back pain   . BPH (benign prostatic hyperplasia)   . Anemia   . DVT (deep venous thrombosis)   . Neuropathy   . Decubitus ulcer     of 2nd toes of both feet.  . Edema leg   . Constipation   . Venous (peripheral) insufficiency   . Neuropathy, diabetic   . Physical deconditioning   . Poor balance   . Difficulty walking   . Lack of coordination   . Arthritis   . Renal insufficiency   . Chronic kidney disease   . Chronic kidney disease (CKD), stage IV (severe)    Past Surgical History  Procedure Laterality Date  . None     Family History  Problem Relation Age of Onset  . Diabetes Mother   . Hypertension Mother   . Heart failure Mother   . Hyperlipidemia Mother   . Cancer Father   . Diabetes Father   . Hypertension Father   . Hyperlipidemia Father    History  Substance Use Topics  . Smoking status: Never Smoker   . Smokeless tobacco: Never Used  . Alcohol Use: No    Review of Systems  A complete 10 system review of systems was  obtained and all systems are negative except as noted in the HPI and PMH.   Allergies  Review of patient's allergies indicates no known allergies.  Home Medications   Prior to Admission medications   Medication Sig Start Date End Date Taking? Authorizing Provider  calcitRIOL (ROCALTROL) 0.25 MCG capsule Take 1 capsule by mouth daily.  06/15/13  Yes Historical Provider, MD  calcium acetate (PHOSLO) 667 MG capsule Take 667 mg by mouth 3 (three) times daily with meals.    Yes Historical Provider, MD  colchicine (COLCRYS) 0.6 MG tablet Take 1 tablet (0.6 mg total) by mouth daily. Patient taking differently: Take 0.6 mg by mouth daily as needed. For gout 10/04/13  Yes [name], MD  febuxostat (ULORIC) 40 MG tablet Take 3 tablets (120 mg total) by mouth daily. Patient taking differently: Take 40 mg by mouth daily.  10/04/13  Yes [name], MD  furosemide (LASIX) 40 MG tablet Take 1 tablet (40 mg total) by mouth daily as needed for fluid. 10/04/13  Yes [name], MD  glucosamine-chondroitin 500-400 MG tablet Take 1 tablet by mouth 3 (three) times daily.   Yes Historical  Provider, MD  metoprolol tartrate (LOPRESSOR) 25 MG tablet Take 37.5 mg by mouth 2 (two) times daily.  11/22/13  Yes Historical Provider, MD  sildenafil (VIAGRA) 100 MG tablet Take 100 mg by mouth daily as needed for erectile dysfunction.   Yes Historical Provider, MD  sodium bicarbonate 650 MG tablet Take 650 mg by mouth 3 (three) times daily.   Yes Historical Provider, MD  traMADol (ULTRAM) 50 MG tablet Take 1 tablet (50 mg total) by mouth every 6 (six) hours as needed. Patient taking differently: Take 50 mg by mouth every 6 (six) hours as needed for moderate pain.  12/13/13  Yes Glendell Docker, NP  Alum & Mag Hydroxide-Simeth (MAGIC MOUTHWASH W/LIDOCAINE) SOLN Gargle and spit 10 mL every 4 hours prn throat pain.    Pharmacy - mix equal parts Patient not taking: Reported on 01/04/2014 06/25/13   Evalee Jefferson, PA-C   oxyCODONE-acetaminophen (PERCOCET) 5-325 MG per tablet Take 1-2 tablets by mouth every 4 (four) hours as needed. 01/04/14   Nat Christen, MD  predniSONE (DELTASONE) 50 MG tablet 1 tablet daily for 4 days, one half tablet daily for 4 days 01/04/14   Nat Christen, MD   BP 138/88 mmHg  Pulse 73  Temp(Src) 98.1 F (36.7 C) (Oral)  Resp 22  Ht 5\' 11"  (1.803 m)  Wt 275 lb (124.739 kg)  BMI 38.37 kg/m2  SpO2 100% Physical Exam  Constitutional: He is oriented to person, place, and time. He appears well-developed and well-nourished.  Obese  HENT:  Head: Normocephalic and atraumatic.  Eyes: Conjunctivae and EOM are normal. Pupils are equal, round, and reactive to light.  Neck: Normal range of motion. Neck supple.  Cardiovascular: Normal rate, regular rhythm and normal heart sounds.   Pulmonary/Chest: Effort normal and breath sounds normal.  Abdominal: Soft. Bowel sounds are normal.  Mild bilateral flank pain  Musculoskeletal: Normal range of motion. He exhibits edema (minimal bilateral ankle edema, 2+).  Tenderness to proximal, medial, posterior forearm   Neurological: He is alert and oriented to person, place, and time.  Skin: Skin is warm and dry.  Psychiatric: He has a normal mood and affect. His behavior is normal.  Nursing note and vitals reviewed.   ED Course  Procedures   DIAGNOSTIC STUDIES: Oxygen Saturation is 100% on RA, normal by my interpretation.    COORDINATION OF CARE: 2:35 PM Discussed treatment plan with pt at bedside and pt agreed to plan.   Labs Review Labs Reviewed  CBC WITH DIFFERENTIAL - Abnormal; Notable for the following:    WBC 12.6 (*)    RBC 4.03 (*)    Hemoglobin 11.2 (*)    HCT 33.8 (*)    Neutro Abs 8.8 (*)    Monocytes Absolute 1.1 (*)    All other components within normal limits  BASIC METABOLIC PANEL - Abnormal; Notable for the following:    Sodium 134 (*)    Potassium 5.7 (*)    BUN 68 (*)    Creatinine, Ser 5.93 (*)    GFR calc non Af Amer  10 (*)    GFR calc Af Amer 12 (*)    All other components within normal limits  URINALYSIS, ROUTINE W REFLEX MICROSCOPIC - Abnormal; Notable for the following:    Hgb urine dipstick MODERATE (*)    Protein, ur 100 (*)    All other components within normal limits  URINE MICROSCOPIC-ADD ON  SEDIMENTATION RATE    Imaging Review No results found.  EKG Interpretation None      MDM   Final diagnoses:  Chronic kidney disease (CKD), stage IV (severe)  Arthritis, gouty    I have seen Mr. Brinkworth several times in the past. He appears to be at baseline. Urinalysis shows no evidence of infection. Creatinine is still elevated. Recommend shunt placement for dialysis. Prescriptions for Percocet and prednisone 50 mg.     I personally performed the services described in this documentation, which was scribed in my presence. The recorded information has been reviewed and considered.     Nat Christen, MD 01/04/14 [phone]

## 2014-01-04 NOTE — ED Notes (Signed)
EDP at bedside  

## 2014-01-04 NOTE — ED Notes (Signed)
Pt called to room, no answer  

## 2014-01-04 NOTE — Discharge Instructions (Signed)
Prescription for prednisone and pain medication.  Your creatinine today is 5.93    Recommend making appointment for your shunt placement in the right arm.   Follow-up your doctor.

## 2014-01-06 ENCOUNTER — Encounter (HOSPITAL_COMMUNITY): Payer: Self-pay | Admitting: Cardiology

## 2014-01-06 ENCOUNTER — Emergency Department (HOSPITAL_COMMUNITY)
Admission: EM | Admit: 2014-01-06 | Discharge: 2014-01-06 | Disposition: A | Discharge status: Home / Self Care | Payer: Medicare Other | Attending: Emergency Medicine | Admitting: Emergency Medicine

## 2014-01-06 ENCOUNTER — Other Ambulatory Visit: Payer: Self-pay

## 2014-01-06 DIAGNOSIS — G8929 Other chronic pain: Secondary | ICD-10-CM | POA: Insufficient documentation

## 2014-01-06 DIAGNOSIS — Z86718 Personal history of other venous thrombosis and embolism: Secondary | ICD-10-CM | POA: Insufficient documentation

## 2014-01-06 DIAGNOSIS — Z862 Personal history of diseases of the blood and blood-forming organs and certain disorders involving the immune mechanism: Secondary | ICD-10-CM | POA: Insufficient documentation

## 2014-01-06 DIAGNOSIS — N184 Chronic kidney disease, stage 4 (severe): Secondary | ICD-10-CM

## 2014-01-06 DIAGNOSIS — M1990 Unspecified osteoarthritis, unspecified site: Secondary | ICD-10-CM | POA: Insufficient documentation

## 2014-01-06 DIAGNOSIS — Z79899 Other long term (current) drug therapy: Secondary | ICD-10-CM | POA: Insufficient documentation

## 2014-01-06 DIAGNOSIS — E1140 Type 2 diabetes mellitus with diabetic neuropathy, unspecified: Secondary | ICD-10-CM | POA: Diagnosis not present

## 2014-01-06 DIAGNOSIS — Z7952 Long term (current) use of systemic steroids: Secondary | ICD-10-CM | POA: Insufficient documentation

## 2014-01-06 DIAGNOSIS — Z8669 Personal history of other diseases of the nervous system and sense organs: Secondary | ICD-10-CM | POA: Diagnosis not present

## 2014-01-06 DIAGNOSIS — I129 Hypertensive chronic kidney disease with stage 1 through stage 4 chronic kidney disease, or unspecified chronic kidney disease: Secondary | ICD-10-CM | POA: Diagnosis not present

## 2014-01-06 DIAGNOSIS — Z8719 Personal history of other diseases of the digestive system: Secondary | ICD-10-CM | POA: Diagnosis not present

## 2014-01-06 DIAGNOSIS — R42 Dizziness and giddiness: Secondary | ICD-10-CM

## 2014-01-06 DIAGNOSIS — M549 Dorsalgia, unspecified: Secondary | ICD-10-CM | POA: Insufficient documentation

## 2014-01-06 DIAGNOSIS — R000 Tachycardia, unspecified: Secondary | ICD-10-CM | POA: Diagnosis not present

## 2014-01-06 LAB — BASIC METABOLIC PANEL
Anion gap: 14 (ref 5–15)
BUN: 70 mg/dL — ABNORMAL HIGH (ref 6–23)
CHLORIDE: 103 meq/L (ref 96–112)
CO2: 22 meq/L (ref 19–32)
CREATININE: 6.14 mg/dL — AB (ref 0.50–1.35)
Calcium: 8.9 mg/dL (ref 8.4–10.5)
GFR calc Af Amer: 11 mL/min — ABNORMAL LOW (ref >90)
GFR calc non Af Amer: 10 mL/min — ABNORMAL LOW (ref >90)
GLUCOSE: 79 mg/dL (ref 70–99)
POTASSIUM: 6 meq/L — AB (ref 3.7–5.3)
Sodium: 139 mEq/L (ref 137–147)

## 2014-01-06 LAB — CBG MONITORING, ED: Glucose-Capillary: 83 mg/dL (ref 70–99)

## 2014-01-06 MED ORDER — SODIUM POLYSTYRENE SULFONATE 15 GM/60ML PO SUSP
30.0000 g | Freq: Once | ORAL | Status: AC
  Administered 2014-01-06: 30 g via ORAL

## 2014-01-06 NOTE — ED Notes (Signed)
Dizziness times one day.

## 2014-01-06 NOTE — ED Notes (Signed)
Patient is resting comfortably. 

## 2014-01-06 NOTE — ED Notes (Addendum)
Unable to draw labs off IV stick.  Pt difficult stick.  Lily Kocher ok with holding off till he sees the patient to order lab work.

## 2014-01-06 NOTE — ED Provider Notes (Signed)
CSN: [id number]     Arrival date & time 01/06/14  1028 History   First MD Initiated Contact with Patient 01/06/14 1033     Chief Complaint  Patient presents with  . Dizziness     (Consider location/radiation/quality/duration/timing/severity/associated sxs/prior Treatment) HPI Comments: Patient is a 48 year old male who presents to the emergency department with a complaint of dizziness for 1 day. The patient has a history of diabetes mellitus, hypertension, stage IV renal disease, and poor balance. He has been diagnosed with diabetic neuropathies. The patient states that over the past day he has been having sensation of dizziness, lightheadedness, sensation of being off balance. He has not had any loss of consciousness. He has not been throwing up. There's been no vomiting and no diarrhea reported. Patient denies chest pain or excessive sweating. The patient states he was seen by his kidney doctor 2 days ago. He was reminded that he needs to have a shunt put in his arm to begin considering dialysis due to his stage IV renal disease. The patient states that he has been reluctant to do that because he is "afraid". Patient denies any recent injury or trauma. He has not taken any medications for the dizziness.  Patient is a 48 y.o. male presenting with dizziness. The history is provided by the patient.  Dizziness Quality:  Imbalance Severity:  Moderate Associated symptoms: no blood in stool, no chest pain, no palpitations and no shortness of breath     Past Medical History  Diagnosis Date  . Hypertension   . Diabetes mellitus   . Gout   . Chronic back pain   . BPH (benign prostatic hyperplasia)   . Anemia   . DVT (deep venous thrombosis)   . Neuropathy   . Decubitus ulcer     of 2nd toes of both feet.  . Edema leg   . Constipation   . Venous (peripheral) insufficiency   . Neuropathy, diabetic   . Physical deconditioning   . Poor balance   . Difficulty walking   . Lack of coordination    . Arthritis   . Renal insufficiency   . Chronic kidney disease   . Chronic kidney disease (CKD), stage IV (severe)    Past Surgical History  Procedure Laterality Date  . None     Family History  Problem Relation Age of Onset  . Diabetes Mother   . Hypertension Mother   . Heart failure Mother   . Hyperlipidemia Mother   . Cancer Father   . Diabetes Father   . Hypertension Father   . Hyperlipidemia Father    History  Substance Use Topics  . Smoking status: Never Smoker   . Smokeless tobacco: Never Used  . Alcohol Use: No    Review of Systems  Constitutional: Negative for activity change.       All ROS Neg except as noted in HPI  Eyes: Negative for photophobia and discharge.  Respiratory: Negative for cough, shortness of breath and wheezing.   Cardiovascular: Negative for chest pain and palpitations.  Gastrointestinal: Negative for abdominal pain and blood in stool.  Genitourinary: Negative for dysuria, frequency and hematuria.  Musculoskeletal: Positive for back pain and arthralgias. Negative for neck pain.  Skin: Negative.   Neurological: Positive for dizziness and light-headedness. Negative for seizures and speech difficulty.  Psychiatric/Behavioral: Negative for hallucinations and confusion.      Allergies  Review of patient's allergies indicates no known allergies.  Home Medications   Prior to Admission  medications   Medication Sig Start Date End Date Taking? Authorizing Provider  calcitRIOL (ROCALTROL) 0.25 MCG capsule Take 1 capsule by mouth daily.  06/15/13  Yes Historical Provider, MD  calcium acetate (PHOSLO) 667 MG capsule Take 667 mg by mouth 3 (three) times daily with meals.    Yes Historical Provider, MD  colchicine (COLCRYS) 0.6 MG tablet Take 1 tablet (0.6 mg total) by mouth daily. Patient taking differently: Take 0.6 mg by mouth daily as needed. For gout 10/04/13  Yes [name], MD  febuxostat (ULORIC) 40 MG tablet Take 3 tablets (120 mg total) by  mouth daily. Patient taking differently: Take 80 mg by mouth daily.  10/04/13  Yes [name], MD  furosemide (LASIX) 40 MG tablet Take 1 tablet (40 mg total) by mouth daily as needed for fluid. 10/04/13  Yes [name], MD  glucosamine-chondroitin 500-400 MG tablet Take 1 tablet by mouth 3 (three) times daily.   Yes Historical Provider, MD  metoprolol tartrate (LOPRESSOR) 25 MG tablet Take 37.5 mg by mouth 2 (two) times daily.  11/22/13  Yes Historical Provider, MD  oxyCODONE-acetaminophen (PERCOCET) 5-325 MG per tablet Take 1-2 tablets by mouth every 4 (four) hours as needed. 01/04/14  Yes Nat Christen, MD  sildenafil (VIAGRA) 100 MG tablet Take 100 mg by mouth daily as needed for erectile dysfunction.   Yes Historical Provider, MD  sodium bicarbonate 650 MG tablet Take 650 mg by mouth 3 (three) times daily.   Yes Historical Provider, MD  traMADol (ULTRAM) 50 MG tablet Take 1 tablet (50 mg total) by mouth every 6 (six) hours as needed. Patient taking differently: Take 50 mg by mouth every 6 (six) hours as needed for moderate pain.  12/13/13  Yes Glendell Docker, NP  Alum & Mag Hydroxide-Simeth (MAGIC MOUTHWASH W/LIDOCAINE) SOLN Gargle and spit 10 mL every 4 hours prn throat pain.    Pharmacy - mix equal parts Patient not taking: Reported on 01/04/2014 06/25/13   Evalee Jefferson, PA-C  predniSONE (DELTASONE) 50 MG tablet 1 tablet daily for 4 days, one half tablet daily for 4 days Patient taking differently: Take 50 mg by mouth daily with breakfast. 1 tablet daily for 4 days, one half tablet daily for 4 days 01/04/14   Nat Christen, MD   BP 141/85 mmHg  Pulse 64  Temp(Src) 97.6 F (36.4 C) (Oral)  Resp 13  Ht 5\' 11"  (1.803 m)  Wt 270 lb (122.471 kg)  BMI 37.67 kg/m2  SpO2 100% Physical Exam  Constitutional: He is oriented to person, place, and time. He appears well-developed and well-nourished.  Non-toxic appearance.  HENT:  Head: Normocephalic.  Right Ear: Tympanic membrane and external ear normal.   Left Ear: Tympanic membrane and external ear normal.  Eyes: EOM and lids are normal. Pupils are equal, round, and reactive to light.  Neck: Normal range of motion. Neck supple. Carotid bruit is not present.  Cardiovascular: Normal rate, regular rhythm, intact distal pulses and normal pulses.   Murmur heard. Pulmonary/Chest: Breath sounds normal. No respiratory distress.  Abdominal: Soft. Bowel sounds are normal. There is no tenderness. There is no guarding.  Musculoskeletal: Normal range of motion.  Lymphadenopathy:       Head (right side): No submandibular adenopathy present.       Head (left side): No submandibular adenopathy present.    He has no cervical adenopathy.  Neurological: He is alert and oriented to person, place, and time. He has normal strength. No  cranial nerve deficit or sensory deficit.  Skin: Skin is warm and dry.  Psychiatric: He has a normal mood and affect. His speech is normal.  Nursing note and vitals reviewed.   ED Course Upon arrival at the bedside, the patient states that his dizziness has resolved. He states that he thinks he may have gotten upset, and worried about his medical conditions. Patient requesting something to eat. Meal has been ordered.   Procedures (including critical care time) Labs Review Labs Reviewed  BASIC METABOLIC PANEL  CBG MONITORING, ED    Imaging Review No results found.   EKG Interpretation None      MDM  Vital signs are well within normal limits. Pulse oximetry is 100% on room air. Within normal limits by my interpretation. Dizziness has resolved while waiting in the exam room. I reviewed the chemistries from December 2 which were done at the patient's nephrology office. Patient had a repeat basic metabolic panel to rule out in acute exacerbation of his renal issues, given that he is a stage IV renal patient. The sodium is normal at 139, the potassium is elevated at 6.0, higher than it was 2 days ago. The BUN is 70 and the  creatinine is 6.14 both of which are also slightly higher than they were 2 days ago. The glucose is 79. The anion gap is 14, within normal limits.  Recheck of the patient reveals the patient to be sleeping comfortably. The patient is given Kayexalate, and referred back to the nephrologist for additional evaluation. Discussed also the importance of him having his arteriovenous shunt put in as his renal function is continuing to deteriorate. The patient acknowledges understanding of discharge instruction.    Final diagnoses:  None    *I have reviewed nursing notes, vital signs, and all appropriate lab and imaging results for this patient.**    Lenox Ahr, PA-C 01/06/14 [place], DO 01/08/14 1329

## 2014-01-06 NOTE — Discharge Instructions (Signed)
Your renal function is getting worse. It is very important that you discuss the shunt for dialysis with Dr.Befekadu. Dizziness  Dizziness means you feel unsteady or lightheaded. You might feel like you are going to pass out (faint). HOME CARE   Drink enough fluids to keep your pee (urine) clear or pale yellow.  Take your medicines exactly as told by your doctor. If you take blood pressure medicine, always stand up slowly from the lying or sitting position. Hold on to something to steady yourself.  If you need to stand in one place for a long time, move your legs often. Tighten and relax your leg muscles.  Have someone stay with you until you feel okay.  Do not drive or use heavy machinery if you feel dizzy.  Do not drink alcohol. GET HELP RIGHT AWAY IF:   You feel dizzy or lightheaded and it gets worse.  You feel sick to your stomach (nauseous), or you throw up (vomit).  You have trouble talking or walking.  You feel weak or have trouble using your arms, hands, or legs.  You cannot think clearly or have trouble forming sentences.  You have chest pain, belly (abdominal) pain, sweating, or you are short of breath.  Your vision changes.  You are bleeding.  You have problems from your medicine that seem to be getting worse. MAKE SURE YOU:   Understand these instructions.  Will watch your condition.  Will get help right away if you are not doing well or get worse. Document Released: 01/09/2011 Document Revised: 04/14/2011 Document Reviewed: 01/09/2011 [facility] Patient Information 2015 [place], [place]. This information is not intended to replace advice given to you by your health care provider. Make sure you discuss any questions you have with your health care provider.

## 2014-01-17 ENCOUNTER — Other Ambulatory Visit: Payer: Self-pay | Admitting: *Deleted

## 2014-01-17 DIAGNOSIS — N185 Chronic kidney disease, stage 5: Secondary | ICD-10-CM

## 2014-01-17 DIAGNOSIS — Z01810 Encounter for preprocedural cardiovascular examination: Secondary | ICD-10-CM

## 2014-02-07 ENCOUNTER — Encounter: Payer: Self-pay | Admitting: Vascular Surgery

## 2014-02-08 ENCOUNTER — Encounter: Payer: Self-pay | Admitting: Vascular Surgery

## 2014-02-08 ENCOUNTER — Ambulatory Visit (HOSPITAL_COMMUNITY)
Admission: RE | Admit: 2014-02-08 | Discharge: 2014-02-08 | Disposition: A | Discharge status: Home / Self Care | Payer: Medicare Other | Source: Ambulatory Visit | Attending: Vascular Surgery | Admitting: Vascular Surgery

## 2014-02-08 ENCOUNTER — Ambulatory Visit (INDEPENDENT_AMBULATORY_CARE_PROVIDER_SITE_OTHER): Payer: Medicare Other | Admitting: Vascular Surgery

## 2014-02-08 ENCOUNTER — Ambulatory Visit (INDEPENDENT_AMBULATORY_CARE_PROVIDER_SITE_OTHER)
Admission: RE | Admit: 2014-02-08 | Discharge: 2014-02-08 | Disposition: A | Discharge status: Home / Self Care | Payer: Medicare Other | Source: Ambulatory Visit | Attending: Vascular Surgery | Admitting: Vascular Surgery

## 2014-02-08 ENCOUNTER — Other Ambulatory Visit: Payer: Self-pay

## 2014-02-08 VITALS — BP 182/116 | HR 55 | Ht 71.0 in | Wt 281.0 lb

## 2014-02-08 DIAGNOSIS — Z01818 Encounter for other preprocedural examination: Secondary | ICD-10-CM | POA: Insufficient documentation

## 2014-02-08 DIAGNOSIS — Z01810 Encounter for preprocedural cardiovascular examination: Secondary | ICD-10-CM

## 2014-02-08 DIAGNOSIS — N185 Chronic kidney disease, stage 5: Secondary | ICD-10-CM

## 2014-02-08 DIAGNOSIS — N184 Chronic kidney disease, stage 4 (severe): Secondary | ICD-10-CM

## 2014-02-08 NOTE — Progress Notes (Signed)
Patient ID: Jack Irwin, male   DOB: [birth year], 49 y.o.   MRN: [id number]  Reason for Consult: To evaluate for hemodialysis access.   Referred by [name], MD  Subjective:     HPI:  Jack Irwin is a 49 y.o. male who presents for evaluation for new access. He is not yet on dialysis. He is right-handed. He denies any recent uremic symptoms. Specifically, he denies nausea, vomiting, fatigue, anorexia, or palpitations. He states that his most recent lab work showed that his GFR was 14%. He is not sure of the etiology of his renal insufficiency but I suspect it is related to his hypertension.  I have reviewed his records that were sent with him. He does have a history of hypertension which had been under good control although today in the office his blood pressure was somewhat elevated I suspect related to anxiety. His end-stage renal disease secondary to hypertension and long-term nonsteroidal anti-inflammatory use. The patient does have a history of congestive heart failure in the past although is currently well compensated. He also has a history of gout.  Past Medical History  Diagnosis Date  . Hypertension   . Diabetes mellitus   . Gout   . Chronic back pain   . BPH (benign prostatic hyperplasia)   . Anemia   . DVT (deep venous thrombosis)   . Neuropathy   . Decubitus ulcer     of 2nd toes of both feet.  . Edema leg   . Constipation   . Venous (peripheral) insufficiency   . Neuropathy, diabetic   . Physical deconditioning   . Poor balance   . Difficulty walking   . Lack of coordination   . Arthritis   . Renal insufficiency   . Chronic kidney disease   . Chronic kidney disease (CKD), stage IV (severe)    Family History  Problem Relation Age of Onset  . Diabetes Mother   . Hypertension Mother   . Heart failure Mother   . Hyperlipidemia Mother   . Heart attack Mother   . Cancer Father   . Diabetes Father   . Hypertension Father   .  Hyperlipidemia Father    Past Surgical History  Procedure Laterality Date  . None      Short Social History:  History  Substance Use Topics  . Smoking status: Never Smoker   . Smokeless tobacco: Never Used  . Alcohol Use: No    No Known Allergies  Current Outpatient Prescriptions  Medication Sig Dispense Refill  . Alum & Mag Hydroxide-Simeth (MAGIC MOUTHWASH W/LIDOCAINE) SOLN Gargle and spit 10 mL every 4 hours prn throat pain.    Pharmacy - mix equal parts 120 mL 0  . calcitRIOL (ROCALTROL) 0.25 MCG capsule Take 1 capsule by mouth daily.     . calcium acetate (PHOSLO) 667 MG capsule Take 667 mg by mouth 3 (three) times daily with meals.     . colchicine (COLCRYS) 0.6 MG tablet Take 1 tablet (0.6 mg total) by mouth daily. (Patient taking differently: Take 0.6 mg by mouth daily as needed. For gout) 10 tablet 0  . febuxostat (ULORIC) 40 MG tablet Take 3 tablets (120 mg total) by mouth daily. (Patient taking differently: Take 80 mg by mouth daily. ) 120 tablet 0  . furosemide (LASIX) 40 MG tablet Take 1 tablet (40 mg total) by mouth daily as needed for fluid. 30 tablet 0  . glucosamine-chondroitin 500-400 MG tablet Take 1 tablet by  mouth 3 (three) times daily.    . metoprolol tartrate (LOPRESSOR) 25 MG tablet Take 37.5 mg by mouth 2 (two) times daily.     [name] oxyCODONE-acetaminophen (PERCOCET) 5-325 MG per tablet Take 1-2 tablets by mouth every 4 (four) hours as needed. 30 tablet 0  . predniSONE (DELTASONE) 50 MG tablet 1 tablet daily for 4 days, one half tablet daily for 4 days (Patient taking differently: Take 50 mg by mouth daily with breakfast. 1 tablet daily for 4 days, one half tablet daily for 4 days) 6 tablet 1  . sildenafil (VIAGRA) 100 MG tablet Take 100 mg by mouth daily as needed for erectile dysfunction.    . sodium bicarbonate 650 MG tablet Take 650 mg by mouth 3 (three) times daily.    . traMADol (ULTRAM) 50 MG tablet Take 1 tablet (50 mg total) by mouth every 6 (six) hours  as needed. (Patient taking differently: Take 50 mg by mouth every 6 (six) hours as needed for moderate pain. ) 10 tablet 0   No current facility-administered medications for this visit.    Review of Systems  Constitutional: Negative for chills and fever.  Eyes: Negative for loss of vision.  Respiratory: Negative for cough and wheezing.  Cardiovascular: Positive for leg swelling. Negative for chest pain, chest tightness, claudication, dyspnea with exertion, orthopnea and palpitations.  GI: Negative for blood in stool and vomiting.  GU: Negative for dysuria and hematuria.  Musculoskeletal: Negative for leg pain, joint pain and myalgias.  Skin: Negative for rash and wound.  Neurological: Negative for dizziness and speech difficulty.  Hematologic: Negative for bruises/bleeds easily. Psychiatric: Negative for depressed mood.        Objective:  Objective  Filed Vitals:   02/08/14 1244  BP: 182/116  Pulse: 55  Height: 5\' 11"  (1.803 m)  Weight: 281 lb (127.461 kg)  SpO2: 100%   Body mass index is 39.21 kg/(m^2).  Physical Exam  Constitutional: He is oriented to person, place, and time. He appears well-developed and well-nourished.  HENT:  Head: Normocephalic and atraumatic.  Neck: Neck supple. No JVD present. No thyromegaly present.  Cardiovascular: Normal rate, regular rhythm and normal heart sounds.  Exam reveals no friction rub.   No murmur heard. Pulses:      Radial pulses are 2+ on the right side, and 2+ on the left side.  I do not detect carotid bruits.  Pulmonary/Chest: Breath sounds normal. He has no wheezes. He has no rales.  Abdominal: Soft. Bowel sounds are normal. There is no tenderness.  Musculoskeletal: Normal range of motion. He exhibits no edema.  Lymphadenopathy:    He has no cervical adenopathy.  Neurological: He is alert and oriented to person, place, and time. He has normal strength. No sensory deficit.  Skin: No lesion and no rash noted.  Psychiatric: He  has a normal mood and affect.    Data: I have independently interpreted his upper extremity vein mapping. He does not appear to have adequate cephalic vein in either arm. The basilic vein on the left appears to be reasonable in size although it empties into the brachial system early.  I have also independently interpreted his arterial Doppler study which shows that he has a high brachial artery bifurcation bilaterally. He does have triphasic Doppler signals in the radial and ulnar positions bilaterally.      Assessment/Plan:     Chronic kidney disease (CKD), stage IV (severe) It appears that his only option for a fistula would  be a basilic vein transposition on the left. If this is not adequate and we would place an AV graft. Given that he has a high bifurcation of his brachial artery he could potentially require an upper arm loop graft. I have explained the indications for placement of an AV fistula or AV graft. I've explained that if at all possible we will place an AV fistula.  I have reviewed the risks of placement of an AV fistula including but not limited to: failure of the fistula to mature, need for subsequent interventions, and thrombosis. In addition I have reviewed the potential complications of placement of an AV graft. These risks include, but are not limited to, graft thrombosis, graft infection, wound healing problems, bleeding, arm swelling, and steal syndrome. All the patient's questions were answered and they are agreeable to proceed with surgery. This surgery is tentatively scheduled for 02/23/2014.    Angelia Mould MD [facility]

## 2014-02-08 NOTE — Assessment & Plan Note (Signed)
It appears that his only option for a fistula would be a basilic vein transposition on the left. If this is not adequate and we would place an AV graft. Given that he has a high bifurcation of his brachial artery he could potentially require an upper arm loop graft. I have explained the indications for placement of an AV fistula or AV graft. I've explained that if at all possible we will place an AV fistula.  I have reviewed the risks of placement of an AV fistula including but not limited to: failure of the fistula to mature, need for subsequent interventions, and thrombosis. In addition I have reviewed the potential complications of placement of an AV graft. These risks include, but are not limited to, graft thrombosis, graft infection, wound healing problems, bleeding, arm swelling, and steal syndrome. All the patient's questions were answered and they are agreeable to proceed with surgery. This surgery is tentatively scheduled for 02/23/2014.

## 2014-02-10 ENCOUNTER — Encounter: Payer: Self-pay | Admitting: Nephrology

## 2014-02-16 ENCOUNTER — Encounter (HOSPITAL_COMMUNITY): Payer: Self-pay

## 2014-02-16 ENCOUNTER — Emergency Department (HOSPITAL_COMMUNITY): Payer: Medicare Other

## 2014-02-16 ENCOUNTER — Inpatient Hospital Stay (HOSPITAL_COMMUNITY)
Admission: EM | Admit: 2014-02-16 | Discharge: 2014-02-22 | DRG: 640 | Disposition: A | Discharge status: Home / Self Care | Payer: Medicare Other | Attending: Internal Medicine | Admitting: Internal Medicine

## 2014-02-16 DIAGNOSIS — N186 End stage renal disease: Secondary | ICD-10-CM

## 2014-02-16 DIAGNOSIS — Z8249 Family history of ischemic heart disease and other diseases of the circulatory system: Secondary | ICD-10-CM | POA: Diagnosis not present

## 2014-02-16 DIAGNOSIS — Z6838 Body mass index (BMI) 38.0-38.9, adult: Secondary | ICD-10-CM

## 2014-02-16 DIAGNOSIS — E11649 Type 2 diabetes mellitus with hypoglycemia without coma: Secondary | ICD-10-CM | POA: Diagnosis present

## 2014-02-16 DIAGNOSIS — E875 Hyperkalemia: Secondary | ICD-10-CM | POA: Diagnosis not present

## 2014-02-16 DIAGNOSIS — D696 Thrombocytopenia, unspecified: Secondary | ICD-10-CM | POA: Diagnosis present

## 2014-02-16 DIAGNOSIS — D649 Anemia, unspecified: Secondary | ICD-10-CM | POA: Diagnosis present

## 2014-02-16 DIAGNOSIS — M1990 Unspecified osteoarthritis, unspecified site: Secondary | ICD-10-CM | POA: Diagnosis present

## 2014-02-16 DIAGNOSIS — I10 Essential (primary) hypertension: Secondary | ICD-10-CM | POA: Diagnosis present

## 2014-02-16 DIAGNOSIS — Z833 Family history of diabetes mellitus: Secondary | ICD-10-CM | POA: Diagnosis not present

## 2014-02-16 DIAGNOSIS — N400 Enlarged prostate without lower urinary tract symptoms: Secondary | ICD-10-CM | POA: Diagnosis present

## 2014-02-16 DIAGNOSIS — N184 Chronic kidney disease, stage 4 (severe): Secondary | ICD-10-CM

## 2014-02-16 DIAGNOSIS — I120 Hypertensive chronic kidney disease with stage 5 chronic kidney disease or end stage renal disease: Secondary | ICD-10-CM | POA: Diagnosis present

## 2014-02-16 DIAGNOSIS — N179 Acute kidney failure, unspecified: Secondary | ICD-10-CM | POA: Diagnosis present

## 2014-02-16 DIAGNOSIS — I872 Venous insufficiency (chronic) (peripheral): Secondary | ICD-10-CM | POA: Diagnosis present

## 2014-02-16 DIAGNOSIS — E871 Hypo-osmolality and hyponatremia: Secondary | ICD-10-CM | POA: Diagnosis present

## 2014-02-16 DIAGNOSIS — E860 Dehydration: Secondary | ICD-10-CM

## 2014-02-16 DIAGNOSIS — R112 Nausea with vomiting, unspecified: Secondary | ICD-10-CM | POA: Diagnosis not present

## 2014-02-16 DIAGNOSIS — T829XXA Unspecified complication of cardiac and vascular prosthetic device, implant and graft, initial encounter: Secondary | ICD-10-CM

## 2014-02-16 DIAGNOSIS — Z809 Family history of malignant neoplasm, unspecified: Secondary | ICD-10-CM | POA: Diagnosis not present

## 2014-02-16 DIAGNOSIS — E1140 Type 2 diabetes mellitus with diabetic neuropathy, unspecified: Secondary | ICD-10-CM | POA: Diagnosis present

## 2014-02-16 DIAGNOSIS — K529 Noninfective gastroenteritis and colitis, unspecified: Secondary | ICD-10-CM

## 2014-02-16 DIAGNOSIS — Z992 Dependence on renal dialysis: Secondary | ICD-10-CM

## 2014-02-16 DIAGNOSIS — R52 Pain, unspecified: Secondary | ICD-10-CM

## 2014-02-16 LAB — BASIC METABOLIC PANEL
ANION GAP: 9 (ref 5–15)
BUN: 91 mg/dL — ABNORMAL HIGH (ref 6–23)
CHLORIDE: 109 meq/L (ref 96–112)
CO2: 19 mmol/L (ref 19–32)
Calcium: 7.7 mg/dL — ABNORMAL LOW (ref 8.4–10.5)
Creatinine, Ser: 5.89 mg/dL — ABNORMAL HIGH (ref 0.50–1.35)
GFR calc Af Amer: 12 mL/min — ABNORMAL LOW (ref >90)
GFR calc non Af Amer: 10 mL/min — ABNORMAL LOW (ref >90)
Glucose, Bld: 83 mg/dL (ref 70–99)
POTASSIUM: 5.3 mmol/L — AB (ref 3.5–5.1)
Sodium: 137 mmol/L (ref 135–145)

## 2014-02-16 LAB — I-STAT CHEM 8, ED
BUN: 125 mg/dL — ABNORMAL HIGH (ref 6–23)
CALCIUM ION: 1 mmol/L — AB (ref 1.12–1.23)
CHLORIDE: 112 meq/L (ref 96–112)
Creatinine, Ser: 6.5 mg/dL — ABNORMAL HIGH (ref 0.50–1.35)
Glucose, Bld: 82 mg/dL (ref 70–99)
HCT: 39 % (ref 39.0–52.0)
HEMOGLOBIN: 13.3 g/dL (ref 13.0–17.0)
Potassium: 8.2 mmol/L (ref 3.5–5.1)
Sodium: 137 mmol/L (ref 135–145)
TCO2: 17 mmol/L (ref 0–100)

## 2014-02-16 LAB — CBC WITH DIFFERENTIAL/PLATELET
Basophils Absolute: 0 10*3/uL (ref 0.0–0.1)
Basophils Relative: 0 % (ref 0–1)
EOS PCT: 1 % (ref 0–5)
Eosinophils Absolute: 0.1 10*3/uL (ref 0.0–0.7)
HEMATOCRIT: 39 % (ref 39.0–52.0)
Hemoglobin: 12.2 g/dL — ABNORMAL LOW (ref 13.0–17.0)
LYMPHS ABS: 1.1 10*3/uL (ref 0.7–4.0)
Lymphocytes Relative: 11 % — ABNORMAL LOW (ref 12–46)
MCH: 27.4 pg (ref 26.0–34.0)
MCHC: 31.3 g/dL (ref 30.0–36.0)
MCV: 87.6 fL (ref 78.0–100.0)
MONO ABS: 0.7 10*3/uL (ref 0.1–1.0)
MONOS PCT: 7 % (ref 3–12)
NEUTROS ABS: 8.4 10*3/uL — AB (ref 1.7–7.7)
NEUTROS PCT: 81 % — AB (ref 43–77)
Platelets: 190 10*3/uL (ref 150–400)
RBC: 4.45 MIL/uL (ref 4.22–5.81)
RDW: 17 % — AB (ref 11.5–15.5)
WBC: 10.3 10*3/uL (ref 4.0–10.5)

## 2014-02-16 LAB — LIPASE, BLOOD

## 2014-02-16 LAB — COMPREHENSIVE METABOLIC PANEL

## 2014-02-16 LAB — POTASSIUM: Potassium: 7.8 mmol/L (ref 3.5–5.1)

## 2014-02-16 MED ORDER — SODIUM CHLORIDE 0.9 % IJ SOLN
3.0000 mL | Freq: Two times a day (BID) | INTRAMUSCULAR | Status: DC
  Administered 2014-02-20 – 2014-02-21 (×2): 3 mL via INTRAVENOUS

## 2014-02-16 MED ORDER — LIDOCAINE-PRILOCAINE 2.5-2.5 % EX CREA
1.0000 "application " | TOPICAL_CREAM | CUTANEOUS | Status: DC | PRN
  Filled 2014-02-16: qty 5

## 2014-02-16 MED ORDER — INSULIN ASPART 100 UNIT/ML IV SOLN: 10.0000 [IU] | Freq: Once | INTRAVENOUS | Status: DC

## 2014-02-16 MED ORDER — CALCITRIOL 0.25 MCG PO CAPS
0.2500 ug | ORAL_CAPSULE | Freq: Every day | ORAL | Status: DC
  Administered 2014-02-17 – 2014-02-22 (×7): 0.25 ug via ORAL
  Filled 2014-02-16 (×7): qty 1

## 2014-02-16 MED ORDER — CALCIUM GLUCONATE 10 % IV SOLN
1.0000 g | Freq: Once | INTRAVENOUS | Status: DC
  Filled 2014-02-16: qty 10

## 2014-02-16 MED ORDER — SODIUM CHLORIDE 0.9 % IV BOLUS (SEPSIS)
500.0000 mL | Freq: Once | INTRAVENOUS | Status: AC
  Administered 2014-02-16: 500 mL via INTRAVENOUS

## 2014-02-16 MED ORDER — HEPARIN SODIUM (PORCINE) 1000 UNIT/ML IJ SOLN
INTRAMUSCULAR | Status: AC
  Filled 2014-02-16: qty 4

## 2014-02-16 MED ORDER — SODIUM CHLORIDE 0.9 % IV SOLN
INTRAVENOUS | Status: DC
Start: 2014-02-16 — End: 2014-02-22

## 2014-02-16 MED ORDER — PENTAFLUOROPROP-TETRAFLUOROETH EX AERO
1.0000 "application " | INHALATION_SPRAY | CUTANEOUS | Status: DC | PRN
  Filled 2014-02-16: qty 30

## 2014-02-16 MED ORDER — SODIUM CHLORIDE 0.9 % IV SOLN: 100.0000 mL | INTRAVENOUS | Status: DC | PRN

## 2014-02-16 MED ORDER — SODIUM POLYSTYRENE SULFONATE 15 GM/60ML PO SUSP: 30.0000 g | Freq: Once | ORAL | Status: DC

## 2014-02-16 MED ORDER — SODIUM BICARBONATE 650 MG PO TABS
650.0000 mg | ORAL_TABLET | Freq: Three times a day (TID) | ORAL | Status: DC
  Administered 2014-02-17 – 2014-02-19 (×9): 650 mg via ORAL
  Filled 2014-02-16 (×8): qty 1

## 2014-02-16 MED ORDER — ALTEPLASE 2 MG IJ SOLR
2.0000 mg | Freq: Once | INTRAMUSCULAR | Status: AC | PRN
  Filled 2014-02-16: qty 2

## 2014-02-16 MED ORDER — LIDOCAINE HCL (PF) 1 % IJ SOLN: 5.0000 mL | INTRAMUSCULAR | Status: DC | PRN

## 2014-02-16 MED ORDER — DEXTROSE 50 % IV SOLN: 1.0000 | Freq: Once | INTRAVENOUS | Status: DC

## 2014-02-16 MED ORDER — ALBUTEROL SULFATE (2.5 MG/3ML) 0.083% IN NEBU
10.0000 mg | INHALATION_SOLUTION | Freq: Once | RESPIRATORY_TRACT | Status: DC
  Filled 2014-02-16: qty 12

## 2014-02-16 MED ORDER — NEPRO/CARBSTEADY PO LIQD
237.0000 mL | ORAL | Status: DC | PRN
  Administered 2014-02-22: 237 mL via ORAL

## 2014-02-16 MED ORDER — ALTEPLASE 2 MG IJ SOLR
INTRAMUSCULAR | Status: AC
  Administered 2014-02-16: 2 mg
  Filled 2014-02-16: qty 4

## 2014-02-16 MED ORDER — ONDANSETRON HCL 4 MG/2ML IJ SOLN
4.0000 mg | Freq: Once | INTRAMUSCULAR | Status: AC
  Administered 2014-02-16: 4 mg via INTRAVENOUS
  Filled 2014-02-16: qty 2

## 2014-02-16 MED ORDER — ONDANSETRON 4 MG PO TBDP: ORAL_TABLET | ORAL | Status: DC

## 2014-02-16 MED ORDER — HYDROMORPHONE HCL 1 MG/ML IJ SOLN
0.5000 mg | Freq: Once | INTRAMUSCULAR | Status: AC
  Administered 2014-02-16: 0.5 mg via INTRAVENOUS
  Filled 2014-02-16: qty 1

## 2014-02-16 MED ORDER — TRAMADOL HCL 50 MG PO TABS: 50.0000 mg | ORAL_TABLET | Freq: Four times a day (QID) | ORAL | Status: DC | PRN

## 2014-02-16 MED ORDER — ENOXAPARIN SODIUM 30 MG/0.3ML ~~LOC~~ SOLN
30.0000 mg | SUBCUTANEOUS | Status: DC
  Administered 2014-02-17 – 2014-02-18 (×2): 30 mg via SUBCUTANEOUS
  Filled 2014-02-16 (×3): qty 0.3

## 2014-02-16 MED ORDER — OXYCODONE-ACETAMINOPHEN 5-325 MG PO TABS
1.0000 | ORAL_TABLET | ORAL | Status: DC | PRN
  Administered 2014-02-17 (×2): 1 via ORAL
  Filled 2014-02-16 (×2): qty 1

## 2014-02-16 MED ORDER — FEBUXOSTAT 40 MG PO TABS
80.0000 mg | ORAL_TABLET | Freq: Every day | ORAL | Status: DC
  Administered 2014-02-17 – 2014-02-22 (×7): 80 mg via ORAL
  Filled 2014-02-16 (×9): qty 2

## 2014-02-16 MED ORDER — METOPROLOL TARTRATE 25 MG PO TABS
37.5000 mg | ORAL_TABLET | Freq: Two times a day (BID) | ORAL | Status: DC
  Administered 2014-02-17 – 2014-02-22 (×11): 37.5 mg via ORAL
  Filled 2014-02-16 (×10): qty 2

## 2014-02-16 MED ORDER — FUROSEMIDE 10 MG/ML IJ SOLN
100.0000 mg | Freq: Two times a day (BID) | INTRAMUSCULAR | Status: DC
  Filled 2014-02-16 (×4): qty 10

## 2014-02-16 MED ORDER — CALCIUM ACETATE 667 MG PO CAPS
667.0000 mg | ORAL_CAPSULE | Freq: Three times a day (TID) | ORAL | Status: DC
  Administered 2014-02-17 – 2014-02-22 (×14): 667 mg via ORAL
  Filled 2014-02-16 (×14): qty 1

## 2014-02-16 MED ORDER — ALTEPLASE 100 MG IV SOLR
2.0000 mg | Freq: Once | INTRAVENOUS | Status: AC
  Administered 2014-02-16: 2 mg

## 2014-02-16 MED ORDER — HEPARIN SODIUM (PORCINE) 1000 UNIT/ML DIALYSIS
1000.0000 [IU] | INTRAMUSCULAR | Status: DC | PRN
  Filled 2014-02-16: qty 1

## 2014-02-16 NOTE — ED Notes (Signed)
Blood sent to lab by this nurse at this time with butterfly needle.

## 2014-02-16 NOTE — ED Notes (Signed)
CRITICAL VALUE ALERT  Critical value received: potassium  Date of notification:  02/16/14  Time of notification:  1803  Critical value read back: yes  Nurse who received alert:  Alinda Money RN  MD notified (1st page): P  Time of first page:  1804  MD notified (2nd page):  Time of second page:  Responding MD:  Betsey Holiday  Time MD responded:  [phone]

## 2014-02-16 NOTE — ED Notes (Signed)
MD at bedside. 

## 2014-02-16 NOTE — ED Notes (Signed)
Dr. Hinda Lenis at bedside.

## 2014-02-16 NOTE — ED Notes (Addendum)
Bedside report given to Ulga,RN in room ICU-12. Dr. Tyrone Sage made aware of no peripheral IV access available at this time.

## 2014-02-16 NOTE — ED Notes (Signed)
Lab called and made aware MD wants potassium results from lab work given to lab at [facility].

## 2014-02-16 NOTE — Procedures (Signed)
Dialysis Catheter Insertion Procedure Note Jack Irwin [id number] [birth year]  Procedure: Insertion of Dialysis Catheter Indications: Acute renal failure, hyperkalemia  Procedure Details Consent: Risks of procedure as well as the alternatives and risks of each were explained to the (patient/caregiver).  Consent for procedure obtained. Time Out: Verified patient identification, verified procedure, site/side was marked, verified correct patient position, special equipment/implants available, medications/allergies/relevent history reviewed, required imaging and test results available.  Performed  Maximum sterile technique was used including antiseptics, cap, gloves, gown, hand hygiene, mask and sheet. Skin prep: Chlorhexidine; local anesthetic administered A antimicrobial bonded/coated double lumen catheter was placed in the right femoral vein due to patient being a dialysis patient using the Seldinger technique.  Evaluation Blood flow good Complications: No apparent complications Patient did tolerate procedure well.   Jack Irwin A 02/16/2014, 7:10 PM

## 2014-02-16 NOTE — ED Notes (Signed)
Multiple unsuccessful attempts on another IV and MD made aware. Lab coming to draw blood at this time.

## 2014-02-16 NOTE — Consult Note (Signed)
Reason for Consult: Hyperkalemia and uremia Referring Physician: Dr. [name] is an 49 y.o. male.  HPI: He is a patient has history of diabetes, hypertension, morbid obesity, chronic renal failure stage V presently came with complaints of nausea, vomiting, diarrhea. When he was evaluated patient was found to have hyperkalemia of 8.2 and worsening of his renal failure with some uremia hence consult is called. Presently patient denies any difficulty breathing. He states that he is taking his medication on regular basis. He started having some nausea the last couple of weeks and has been getting worst. He has also some abdominal pain.  Past Medical History  Diagnosis Date  . Hypertension   . Diabetes mellitus   . Gout   . Chronic back pain   . BPH (benign prostatic hyperplasia)   . Anemia   . DVT (deep venous thrombosis)   . Neuropathy   . Decubitus ulcer     of 2nd toes of both feet.  . Edema leg   . Constipation   . Venous (peripheral) insufficiency   . Neuropathy, diabetic   . Physical deconditioning   . Poor balance   . Difficulty walking   . Lack of coordination   . Arthritis   . Renal insufficiency   . Chronic kidney disease   . Chronic kidney disease (CKD), stage IV (severe)     Past Surgical History  Procedure Laterality Date  . None      Family History  Problem Relation Age of Onset  . Diabetes Mother   . Hypertension Mother   . Heart failure Mother   . Hyperlipidemia Mother   . Heart attack Mother   . Cancer Father   . Diabetes Father   . Hypertension Father   . Hyperlipidemia Father     Social History:  reports that he has never smoked. He has never used smokeless tobacco. He reports that he does not drink alcohol or use illicit drugs.  Allergies: No Known Allergies  Medications: I have reviewed the [name] current medications.  Results for orders placed or performed during the hospital encounter of 02/16/14 (from the past 48  hour(Irwin))  CBC with Differential     Status: Abnormal   Collection Time: 02/16/14 12:20 PM  Result Value Ref Range   WBC 10.3 4.0 - 10.5 K/uL   RBC 4.45 4.22 - 5.81 MIL/uL   Hemoglobin 12.2 (L) 13.0 - 17.0 g/dL   HCT 39.0 39.0 - 52.0 %   MCV 87.6 78.0 - 100.0 fL   MCH 27.4 26.0 - 34.0 pg   MCHC 31.3 30.0 - 36.0 g/dL   RDW 17.0 (H) 11.5 - 15.5 %   Platelets 190 150 - 400 K/uL   Neutrophils Relative % 81 (H) 43 - 77 %   Neutro Abs 8.4 (H) 1.7 - 7.7 K/uL   Lymphocytes Relative 11 (L) 12 - 46 %   Lymphs Abs 1.1 0.7 - 4.0 K/uL   Monocytes Relative 7 3 - 12 %   Monocytes Absolute 0.7 0.1 - 1.0 K/uL   Eosinophils Relative 1 0 - 5 %   Eosinophils Absolute 0.1 0.0 - 0.7 K/uL   Basophils Relative 0 0 - 1 %   Basophils Absolute 0.0 0.0 - 0.1 K/uL  I-stat chem 8, ed     Status: Abnormal   Collection Time: 02/16/14  2:25 PM  Result Value Ref Range   Sodium 137 135 - 145 mmol/L   Potassium 8.2 (HH)  3.5 - 5.1 mmol/L   Chloride 112 96 - 112 mEq/L   BUN 125 (H) 6 - 23 mg/dL   Creatinine, Ser 6.50 (H) 0.50 - 1.35 mg/dL   Glucose, Bld 82 70 - 99 mg/dL   Calcium, Ion 1.00 (L) 1.12 - 1.23 mmol/L   TCO2 17 0 - 100 mmol/L   Hemoglobin 13.3 13.0 - 17.0 g/dL   HCT 39.0 39.0 - 52.0 %   Comment MD NOTIFIED, REPEAT TEST   Potassium     Status: Abnormal   Collection Time: 02/16/14  4:15 PM  Result Value Ref Range   Potassium 7.8 (HH) 3.5 - 5.1 mmol/L    Comment: Please note change in reference range. RESULT REPEATED AND VERIFIED CRITICAL RESULT CALLED TO, READ BACK BY AND VERIFIED WITH: ROUDS,T AT 1805 ON 02/16/2014 BY ISLEY,B     Dg Abd Acute W/chest  02/16/2014   CLINICAL DATA:  Generalized abdominal pain and nausea with vomiting since this morning.  EXAM: ACUTE ABDOMEN SERIES (ABDOMEN 2 VIEW & CHEST 1 VIEW)  COMPARISON:  Chest radiographs 06/25/2013 and abdominal series 03/28/2013  FINDINGS: Cardiac silhouette is mildly accentuated by hypoinflation but does not appear significantly changed from  the prior study and is likely upper limits of normal limits in size. No airspace consolidation, edema, pleural effusion, or pneumothorax is identified.  There is no evidence of intraperitoneal free air. Gas and stool are present throughout nondilated colon. A small amount of gas is seen in a few scattered loops of grossly nondilated small bowel, although evaluation for obstruction is limited by the overall paucity of small bowel. Small calcifications in the pelvis likely represent phleboliths. Thoracolumbar osteophytosis is noted.  IMPRESSION: 1. No evidence of acute airspace disease. 2. No definite evidence of bowel obstruction, however evaluation is somewhat limited by a paucity of small bowel gas.   Electronically Signed   By: Logan Bores   On: 02/16/2014 14:22    Review of Systems  Respiratory: Negative for shortness of breath.   Cardiovascular: Negative for orthopnea and leg swelling.  Gastrointestinal: Positive for nausea, vomiting, abdominal pain and diarrhea.   Blood pressure 171/110, pulse 70, temperature 98.1 F (36.7 C), temperature source Oral, resp. rate 18, height 5\' 11"  (1.803 m), weight 122.471 kg (270 lb), SpO2 100 %. Physical Exam  Constitutional: He appears distressed.  Eyes: No scleral icterus.  Neck: No JVD present.  Cardiovascular: Normal rate and regular rhythm.   No murmur heard. Respiratory: He has no wheezes. He has no rales.  GI: He exhibits no distension. There is no tenderness.  Musculoskeletal: He exhibits edema.    Assessment/Plan: Problem #1 hyperkalemia: Possibly combination of high potassium intake and worsening of renal failure. Problem #2 renal failure: Possibly natural progression of the disease however superimposed prerenal syndrome over chronic renal failure at this moment cannot ruled out. Patient seems to have some uremic signs and symptoms. Problem #3 chronic renal failure: Stage V. Etiology was thought to be secondary to  diabetes/hypertension/obesity related glomerulopathy/ischemic. Problem #4 history of diabetes Problem #5 metabolic bone disease: His calcium is range. Patient also with hyperparathyroidism on Rocaltrol. Problem #6 metabolism for disease: Patient on sodium bicarbonate as an outpatient Problem #7 diabetic neuropathy Problem #8 history of right bundle branch block Problem #9 morbid obesity Problem #10 hypertension: His blood pressure is high. Plan: We'll make arrangement for patient to get dialysis today. We'll use one K/2.5 calcium bath for 2 hours. We'll continue his IV fluid at 100 mL  per hour We'll put him on Lasix 100 mg IV twice a day We'll check his basic metabolic panel, phosphorus in the morning. If his renal function stabilizes possibly will continue with fistula placement otherwise we may need to put tunnel catheter for continuous dialysis.  Jack Irwin 02/16/2014, 6:59 PM

## 2014-02-16 NOTE — ED Provider Notes (Signed)
CSN: [id number]     Arrival date & time 02/16/14  1202 History  This chart was scribed for Maudry Diego, MD by Stephania Fragmin, ED Scribe. This patient was seen in room APA04/AP[facility]04 and the patient's care was started at 12:47 PM.    Chief Complaint  Patient presents with  . Abdominal Pain   Patient is a 49 y.o. male presenting with abdominal pain. The history is provided by the patient. No language interpreter was used.  Abdominal Pain Pain location:  Generalized Pain severity:  Mild Timing:  Constant Chronicity:  New Relieved by:  None tried Worsened by:  Nothing tried Ineffective treatments:  None tried Associated symptoms: diarrhea, nausea and vomiting   Associated symptoms: no chest pain, no cough, no fatigue and no hematuria      HPI Comments: Jack Irwin is a 49 y.o. male who presents to the Emergency Department complaining of generalized abdominal pain. He also complains of associated constant nausea, 4-5 episodes of vomiting, and several episodes of diarrhea. He reports some relief from anti-nausea medication. He states he hasn't taken his daily medications yet today.    Past Medical History  Diagnosis Date  . Hypertension   . Diabetes mellitus   . Gout   . Chronic back pain   . BPH (benign prostatic hyperplasia)   . Anemia   . DVT (deep venous thrombosis)   . Neuropathy   . Decubitus ulcer     of 2nd toes of both feet.  . Edema leg   . Constipation   . Venous (peripheral) insufficiency   . Neuropathy, diabetic   . Physical deconditioning   . Poor balance   . Difficulty walking   . Lack of coordination   . Arthritis   . Renal insufficiency   . Chronic kidney disease   . Chronic kidney disease (CKD), stage IV (severe)    Past Surgical History  Procedure Laterality Date  . None     Family History  Problem Relation Age of Onset  . Diabetes Mother   . Hypertension Mother   . Heart failure Mother   . Hyperlipidemia Mother   . Heart attack Mother   .  Cancer Father   . Diabetes Father   . Hypertension Father   . Hyperlipidemia Father    History  Substance Use Topics  . Smoking status: Never Smoker   . Smokeless tobacco: Never Used  . Alcohol Use: No    Review of Systems  Constitutional: Negative for fatigue.  HENT: Negative for congestion, ear discharge and sinus pressure.   Eyes: Negative for discharge.  Respiratory: Negative for cough.   Cardiovascular: Negative for chest pain.  Gastrointestinal: Positive for nausea, vomiting, abdominal pain and diarrhea.  Genitourinary: Negative for frequency and hematuria.  Musculoskeletal: Negative for back pain.  Skin: Negative for rash.  Neurological: Negative for seizures and headaches.  Psychiatric/Behavioral: Negative for hallucinations.      Allergies  Review of patient's allergies indicates no known allergies.  Home Medications   Prior to Admission medications   Medication Sig Start Date End Date Taking? Authorizing Provider  Alum & Mag Hydroxide-Simeth (MAGIC MOUTHWASH W/LIDOCAINE) SOLN Gargle and spit 10 mL every 4 hours prn throat pain.    Pharmacy - mix equal parts 06/25/13   Evalee Jefferson, PA-C  calcitRIOL (ROCALTROL) 0.25 MCG capsule Take 1 capsule by mouth daily.  06/15/13   Historical Provider, MD  calcium acetate (PHOSLO) 667 MG capsule Take 667 mg by mouth 3 (  three) times daily with meals.     Historical Provider, MD  colchicine (COLCRYS) 0.6 MG tablet Take 1 tablet (0.6 mg total) by mouth daily. Patient taking differently: Take 0.6 mg by mouth daily as needed. For gout 10/04/13   [name], MD  febuxostat (ULORIC) 40 MG tablet Take 3 tablets (120 mg total) by mouth daily. Patient taking differently: Take 80 mg by mouth daily.  10/04/13   [name], MD  furosemide (LASIX) 40 MG tablet Take 1 tablet (40 mg total) by mouth daily as needed for fluid. 10/04/13   [name], MD  glucosamine-chondroitin 500-400 MG tablet Take 1 tablet by mouth 3 (three) times daily.     Historical Provider, MD  metoprolol tartrate (LOPRESSOR) 25 MG tablet Take 37.5 mg by mouth 2 (two) times daily.  11/22/13   Historical Provider, MD  oxyCODONE-acetaminophen (PERCOCET) 5-325 MG per tablet Take 1-2 tablets by mouth every 4 (four) hours as needed. 01/04/14   Nat Christen, MD  predniSONE (DELTASONE) 50 MG tablet 1 tablet daily for 4 days, one half tablet daily for 4 days Patient taking differently: Take 50 mg by mouth daily with breakfast. 1 tablet daily for 4 days, one half tablet daily for 4 days 01/04/14   Nat Christen, MD  sildenafil (VIAGRA) 100 MG tablet Take 100 mg by mouth daily as needed for erectile dysfunction.    Historical Provider, MD  sodium bicarbonate 650 MG tablet Take 650 mg by mouth 3 (three) times daily.    Historical Provider, MD  traMADol (ULTRAM) 50 MG tablet Take 1 tablet (50 mg total) by mouth every 6 (six) hours as needed. Patient taking differently: Take 50 mg by mouth every 6 (six) hours as needed for moderate pain.  12/13/13   Glendell Docker, NP   BP 135/93 mmHg  Pulse 72  Temp(Src) 98.1 F (36.7 C) (Oral)  Resp 18  Ht 5\' 11"  (1.803 m)  Wt 270 lb (122.471 kg)  BMI 37.67 kg/m2  SpO2 100% Physical Exam  Constitutional: He is oriented to person, place, and time. He appears well-developed and well-nourished. No distress.  HENT:  Head: Normocephalic and atraumatic.  Eyes: Conjunctivae and EOM are normal. No scleral icterus.  Neck: Neck supple. No tracheal deviation present. No thyromegaly present.  Cardiovascular: Normal rate and regular rhythm.  Exam reveals no gallop and no friction rub.   No murmur heard. Pulmonary/Chest: Effort normal. No stridor. No respiratory distress. He has no wheezes. He has no rales. He exhibits no tenderness.  Abdominal: He exhibits no distension. There is tenderness. There is no rebound.  Mild diffuse tenderness to abdomen throughout.  Musculoskeletal: Normal range of motion. He exhibits no edema.  Lymphadenopathy:    He  has no cervical adenopathy.  Neurological: He is alert and oriented to person, place, and time. He exhibits normal muscle tone. Coordination normal.  Skin: Skin is warm and dry. No rash noted. No erythema.  Psychiatric: He has a normal mood and affect. His behavior is normal.  Nursing note and vitals reviewed.   ED Course  Procedures (including critical care time)  DIAGNOSTIC STUDIES: Oxygen Saturation is 100% on room air, normal by my interpretation.    COORDINATION OF CARE: 12:48 PM - Discussed treatment plan with pt at bedside which includes tests and XR and pt agreed to plan.   Labs Review Labs Reviewed  CBC WITH DIFFERENTIAL - Abnormal; Notable for the following:    Hemoglobin 12.2 (*)  RDW 17.0 (*)    Neutrophils Relative % 81 (*)    Neutro Abs 8.4 (*)    Lymphocytes Relative 11 (*)    All other components within normal limits  COMPREHENSIVE METABOLIC PANEL  LIPASE, BLOOD    Imaging Review No results found.   EKG Interpretation None      Date:1/14  Rate:63  Rhythm: normal sinus rhythm  QRS Axis: normal  Intervals: normal  ST/T Wave abnormalities: normal  Conduction Disutrbances:none  Narrative Interpretation:   Old EKG Reviewed: none available   MDM   Final diagnoses:  None    vomiting   Maudry Diego, MD 02/16/14 [phone]

## 2014-02-16 NOTE — H&P (Signed)
[facility] History and Physical  Jack Irwin [id number] DOB: Apr 15, 1965 DOA: 02/16/2014  Referring physician: ER PCP: [name], MD   Chief Complaint: Nausea and vomiting.  HPI: Jack Irwin is a 49 y.o. male  This is a 49 year old man, who has history of hypertension, obesity, chronic renal failure stage V who comes in with a history of nausea, vomiting and also diarrhea. He has no some abdominal pain but not severe. He has had nausea for the last couple weeks and it has been getting worse. Evaluation in the emergency room showed him to have a potassium of 8.2. He is being admitted for emergent hemodialysis and nephrology has been consulted.   Review of Systems:  Apart from symptoms above, other systems negative.  Past Medical History  Diagnosis Date  . Hypertension   . Diabetes mellitus   . Gout   . Chronic back pain   . BPH (benign prostatic hyperplasia)   . Anemia   . DVT (deep venous thrombosis)   . Neuropathy   . Decubitus ulcer     of 2nd toes of both feet.  . Edema leg   . Constipation   . Venous (peripheral) insufficiency   . Neuropathy, diabetic   . Physical deconditioning   . Poor balance   . Difficulty walking   . Lack of coordination   . Arthritis   . Renal insufficiency   . Chronic kidney disease   . Chronic kidney disease (CKD), stage IV (severe)    Past Surgical History  Procedure Laterality Date  . None     Social History:  reports that he has never smoked. He has never used smokeless tobacco. He reports that he does not drink alcohol or use illicit drugs.  No Known Allergies  Family History  Problem Relation Age of Onset  . Diabetes Mother   . Hypertension Mother   . Heart failure Mother   . Hyperlipidemia Mother   . Heart attack Mother   . Cancer Father   . Diabetes Father   . Hypertension Father   . Hyperlipidemia Father      Prior to Admission medications   Medication Sig Start Date End Date Taking?  Authorizing Provider  calcitRIOL (ROCALTROL) 0.25 MCG capsule Take 1 capsule by mouth daily.  06/15/13  Yes Historical Provider, MD  calcium acetate (PHOSLO) 667 MG capsule Take 667 mg by mouth 3 (three) times daily with meals.    Yes Historical Provider, MD  Cholecalciferol (VITAMIN D3) 1000 UNITS CAPS Take 1 capsule by mouth daily.   Yes Historical Provider, MD  COD LIVER OIL PO Take 1 capsule by mouth daily.   Yes Historical Provider, MD  colchicine (COLCRYS) 0.6 MG tablet Take 1 tablet (0.6 mg total) by mouth daily. Patient taking differently: Take 0.6 mg by mouth daily as needed. For gout 10/04/13  Yes [name], MD  febuxostat (ULORIC) 40 MG tablet Take 3 tablets (120 mg total) by mouth daily. Patient taking differently: Take 80 mg by mouth daily.  10/04/13  Yes [name], MD  furosemide (LASIX) 40 MG tablet Take 1 tablet (40 mg total) by mouth daily as needed for fluid. 10/04/13  Yes [name], MD  glucosamine-chondroitin 500-400 MG tablet Take 1 tablet by mouth daily as needed (bones).    Yes Historical Provider, MD  metoprolol tartrate (LOPRESSOR) 25 MG tablet Take 37.5 mg by mouth 2 (two) times daily.  11/22/13  Yes Historical Provider, MD  oxyCODONE-acetaminophen (  PERCOCET) 5-325 MG per tablet Take 1-2 tablets by mouth every 4 (four) hours as needed. Patient taking differently: Take 1-2 tablets by mouth every 4 (four) hours as needed for moderate pain.  01/04/14  Yes Nat Christen, MD  sildenafil (VIAGRA) 100 MG tablet Take 100 mg by mouth daily as needed for erectile dysfunction.   Yes Historical Provider, MD  sodium bicarbonate 650 MG tablet Take 650 mg by mouth 3 (three) times daily.   Yes Historical Provider, MD  traMADol (ULTRAM) 50 MG tablet Take 1 tablet (50 mg total) by mouth every 6 (six) hours as needed. Patient taking differently: Take 50 mg by mouth every 6 (six) hours as needed for moderate pain.  12/13/13  Yes Glendell Docker, NP  Alum & Mag Hydroxide-Simeth (MAGIC MOUTHWASH  W/LIDOCAINE) SOLN Gargle and spit 10 mL every 4 hours prn throat pain.    Pharmacy - mix equal parts Patient not taking: Reported on 02/16/2014 06/25/13   Evalee Jefferson, PA-C  ondansetron (ZOFRAN ODT) 4 MG disintegrating tablet 4mg  ODT q4 hours prn nausea/vomit 02/16/14   Maudry Diego, MD  predniSONE (DELTASONE) 50 MG tablet 1 tablet daily for 4 days, one half tablet daily for 4 days Patient taking differently: Take 50 mg by mouth daily with breakfast. 1 tablet daily for 4 days, one half tablet daily for 4 days 01/04/14   Nat Christen, MD   Physical Exam: Filed Vitals:   02/16/14 1846 02/16/14 1958 02/16/14 2002 02/16/14 2015  BP: 171/110 165/99 154/88 149/96  Pulse: 70 61 63 68  Temp:  98.4 F (36.9 C)    TempSrc:  Oral    Resp: 18 23  15   Height:  5\' 11"  (1.803 m)    Weight:  124.739 kg (275 lb)    SpO2: 100% 100% 99% 100%    Wt Readings from Last 3 Encounters:  02/16/14 124.739 kg (275 lb)  02/08/14 127.461 kg (281 lb)  01/06/14 122.471 kg (270 lb)    General:  Appears calm and comfortable. Morbidly obese. He is currently having hemodialysis already. Alert and orientated. Eyes: PERRL, normal lids, irises & conjunctiva ENT: grossly normal hearing, lips & tongue Neck: no LAD, masses or thyromegaly Cardiovascular: RRR, no m/r/g. No LE edema. Telemetry: SR, no arrhythmias  Respiratory: CTA bilaterally, no w/r/r. Normal respiratory effort. Abdomen: soft, ntnd Skin: no rash or induration seen on limited exam Musculoskeletal: grossly normal tone BUE/BLE Psychiatric: grossly normal mood and affect, speech fluent and appropriate Neurologic: grossly non-focal.          Labs on Admission:  Basic Metabolic Panel:  Recent Labs Lab 02/16/14 1220 02/16/14 1425 02/16/14 1615  NA CANCELLED BY LAB 137  --   K  --  8.2* 7.8*  CL  --  112  --   GLUCOSE  --  82  --   BUN  --  125*  --   CREATININE  --  6.50*  --    Liver Function Tests: No results for input(s): AST, ALT, ALKPHOS,  BILITOT, PROT, ALBUMIN in the last 168 hours.  Recent Labs Lab 02/16/14 1220  LIPASE CANCELLED BY LAB   No results for input(s): AMMONIA in the last 168 hours. CBC:  Recent Labs Lab 02/16/14 1220 02/16/14 1425  WBC 10.3  --   NEUTROABS 8.4*  --   HGB 12.2* 13.3  HCT 39.0 39.0  MCV 87.6  --   PLT 190  --    Cardiac Enzymes: No results for input(s):  CKTOTAL, CKMB, CKMBINDEX, TROPONINI in the last 168 hours.  BNP (last 3 results) No results for input(s): PROBNP in the last 8760 hours. CBG: No results for input(s): GLUCAP in the last 168 hours.  Radiological Exams on Admission: Dg Abd Acute W/chest  02/16/2014   CLINICAL DATA:  Generalized abdominal pain and nausea with vomiting since this morning.  EXAM: ACUTE ABDOMEN SERIES (ABDOMEN 2 VIEW & CHEST 1 VIEW)  COMPARISON:  Chest radiographs 06/25/2013 and abdominal series 03/28/2013  FINDINGS: Cardiac silhouette is mildly accentuated by hypoinflation but does not appear significantly changed from the prior study and is likely upper limits of normal limits in size. No airspace consolidation, edema, pleural effusion, or pneumothorax is identified.  There is no evidence of intraperitoneal free air. Gas and stool are present throughout nondilated colon. A small amount of gas is seen in a few scattered loops of grossly nondilated small bowel, although evaluation for obstruction is limited by the overall paucity of small bowel. Small calcifications in the pelvis likely represent phleboliths. Thoracolumbar osteophytosis is noted.  IMPRESSION: 1. No evidence of acute airspace disease. 2. No definite evidence of bowel obstruction, however evaluation is somewhat limited by a paucity of small bowel gas.   Electronically Signed   By: Logan Bores   On: 02/16/2014 14:22    EKG: Independently reviewed. Normal sinus rhythm. Possibility of peaked T waves.  Assessment/Plan   1. Hyperkalemia with acute on chronic renal failure. Potassium is 8.2.  Nephrology is also consulting [place] and has seen the patient already. He is having emergent hemodialysis currently. Nephrology will follow closely. Follow renal function closely. Admit to intensive care unit. 2. Hypertension, stable. 3. Morbid obesity.  Further recommendations will depend on patient's hospital progress.  Code Status: Full code.   DVT Prophylaxis: Lovenox.  Family Communication: I discussed the plan with the patient at the bedside.   Disposition Plan: Home when medically stable.   Time spent: 60 minutes.  Doree Albee [facility] Pager [phone].

## 2014-02-16 NOTE — Progress Notes (Signed)
Hemodialysis- Cath not functioning. System clotted after 1 hour 10 minutes. Dr. Hinda Lenis paged. Order to tpa catheter and draw stat BMET. Continue to monitor patient. Awaiting orders.

## 2014-02-16 NOTE — ED Notes (Signed)
Complain of abdominal pain and vomiting

## 2014-02-16 NOTE — ED Notes (Signed)
Lab called and will draw blood next on this pt.

## 2014-02-16 NOTE — Discharge Instructions (Signed)
Follow up with your md for recheck next week 

## 2014-02-16 NOTE — ED Notes (Signed)
Dialysis nurse stated to bring pt to ICU-12 to do emergent dialysis at this time.

## 2014-02-16 NOTE — ED Provider Notes (Signed)
Patient signed out to me with labs pending. Patient had i-STAT chem 8 which showed elevated potassium. It was thought that this might be secondary to hemolysis, however. Labs were redrawn. Patient does have a history of stage IV renal disease. He is scheduled for dialysis fistula surgery next week. He has not, however, had dialysis yet and does not have temporary access. Patient's BUN is significantly elevated above baseline. This is likely secondary to a gastroenteritis that he has been experiencing. Acute worsening of his renal failure secondary to dehydration, but does indeed have significant hyperkalemia. Repeat potassium was 7.8.  Patient was administered calcium, insulin, albuterol, Kayexalate. EKG was reviewed, he does have slight peaking of T waves but no QRS widening or arrhythmia.  Dr. Lowanda Foster, nephrology was contacted. Patient's presentation and labs were reviewed. He feels that the patient would not be adequately treated with medical management alone. He is recommending dialysis tonight. Dr. Arnoldo Morale, general surgery was consult. He will place a temporary dialysis catheter. Patient discussed with Dr. Anastasio Champion, hospitalist service. Patient will be admitted to step down unit.   EKG Interpretation  Date/Time:  Thursday February 16 2014 14:59:35 EST Ventricular Rate:  63 PR Interval:  138 QRS Duration: 144 QT Interval:  427 QTC Calculation: 437 R Axis:   -38 Text Interpretation:  Sinus rhythm Right bundle branch block Baseline wander in lead(s) V4 No significant change since last tracing Confirmed by Rodolphe Edmonston  MD, Shantoria Ellwood [phone]) on 02/16/2014 4:16:34 PM      Diagnosis: Acute on chronic renal failure; hyperkalemia  CRITICAL CARE Performed by: Orpah Greek.   Total critical care time: 64min  Critical care time was exclusive of separately billable procedures and treating other patients.  Critical care was necessary to treat or prevent imminent or life-threatening  deterioration.  Critical care was time spent personally by me on the following activities: development of treatment plan with patient and/or surrogate as well as nursing, discussions with consultants, evaluation of patient's response to treatment, examination of patient, obtaining history from patient or surrogate, ordering and performing treatments and interventions, ordering and review of laboratory studies, ordering and review of radiographic studies, pulse oximetry and re-evaluation of patient's condition.   Orpah Greek, MD 02/16/14 [phone]

## 2014-02-17 LAB — GLUCOSE, CAPILLARY: GLUCOSE-CAPILLARY: 89 mg/dL (ref 70–99)

## 2014-02-17 LAB — MRSA PCR SCREENING: MRSA by PCR: NEGATIVE

## 2014-02-17 LAB — CBC
HCT: 33.2 % — ABNORMAL LOW (ref 39.0–52.0)
HEMOGLOBIN: 10.7 g/dL — AB (ref 13.0–17.0)
MCH: 28.1 pg (ref 26.0–34.0)
MCHC: 32.2 g/dL (ref 30.0–36.0)
MCV: 87.1 fL (ref 78.0–100.0)
Platelets: 126 10*3/uL — ABNORMAL LOW (ref 150–400)
RBC: 3.81 MIL/uL — ABNORMAL LOW (ref 4.22–5.81)
RDW: 16.8 % — ABNORMAL HIGH (ref 11.5–15.5)
WBC: 6.7 10*3/uL (ref 4.0–10.5)

## 2014-02-17 LAB — COMPREHENSIVE METABOLIC PANEL
ALBUMIN: 3.1 g/dL — AB (ref 3.5–5.2)
ALK PHOS: 70 U/L (ref 39–117)
ALT: 18 U/L (ref 0–53)
AST: 26 U/L (ref 0–37)
Anion gap: 6 (ref 5–15)
BUN: 87 mg/dL — ABNORMAL HIGH (ref 6–23)
CHLORIDE: 110 meq/L (ref 96–112)
CO2: 23 mmol/L (ref 19–32)
CREATININE: 6.31 mg/dL — AB (ref 0.50–1.35)
Calcium: 8 mg/dL — ABNORMAL LOW (ref 8.4–10.5)
GFR calc Af Amer: 11 mL/min — ABNORMAL LOW (ref >90)
GFR calc non Af Amer: 10 mL/min — ABNORMAL LOW (ref >90)
Glucose, Bld: 93 mg/dL (ref 70–99)
Potassium: 5.3 mmol/L — ABNORMAL HIGH (ref 3.5–5.1)
Sodium: 139 mmol/L (ref 135–145)
Total Bilirubin: 1.1 mg/dL (ref 0.3–1.2)
Total Protein: 6.8 g/dL (ref 6.0–8.3)

## 2014-02-17 LAB — HEPATITIS B CORE ANTIBODY, TOTAL: HEP B C TOTAL AB: NONREACTIVE

## 2014-02-17 LAB — PHOSPHORUS: PHOSPHORUS: 5.6 mg/dL — AB (ref 2.3–4.6)

## 2014-02-17 LAB — HEPATITIS B SURFACE ANTIGEN: Hepatitis B Surface Ag: NEGATIVE

## 2014-02-17 MED ORDER — SODIUM CHLORIDE 0.9 % IJ SOLN
INTRAMUSCULAR | Status: AC
  Administered 2014-02-17: 10 mL via INTRAVENOUS
  Filled 2014-02-17: qty 18

## 2014-02-17 MED ORDER — HEPARIN SODIUM (PORCINE) 1000 UNIT/ML IJ SOLN
INTRAMUSCULAR | Status: AC
  Administered 2014-02-17: 2500 [IU] via INTRAVENOUS_CENTRAL
  Filled 2014-02-17: qty 3

## 2014-02-17 MED ORDER — OXYCODONE-ACETAMINOPHEN 5-325 MG PO TABS
1.0000 | ORAL_TABLET | ORAL | Status: DC | PRN
  Administered 2014-02-17: 1 via ORAL
  Administered 2014-02-17 – 2014-02-18 (×2): 2 via ORAL
  Filled 2014-02-17: qty 1
  Filled 2014-02-17 (×2): qty 2

## 2014-02-17 MED ORDER — HEPARIN SODIUM (PORCINE) 1000 UNIT/ML DIALYSIS
1000.0000 [IU] | INTRAMUSCULAR | Status: DC | PRN
  Administered 2014-02-18: 4100 [IU] via INTRAVENOUS_CENTRAL
  Filled 2014-02-17 (×2): qty 1

## 2014-02-17 MED ORDER — SODIUM CHLORIDE 0.9 % IJ SOLN
10.0000 mL | INTRAMUSCULAR | Status: DC | PRN
  Administered 2014-02-17 – 2014-02-21 (×6): 10 mL via INTRAVENOUS
  Filled 2014-02-17 (×6): qty 10

## 2014-02-17 MED ORDER — ALTEPLASE 2 MG IJ SOLR
2.0000 mg | Freq: Once | INTRAMUSCULAR | Status: AC | PRN
  Administered 2014-02-17: 4 mg
  Filled 2014-02-17: qty 2

## 2014-02-17 MED ORDER — HEPARIN SODIUM (PORCINE) 1000 UNIT/ML DIALYSIS
20.0000 [IU]/kg | Freq: Once | INTRAMUSCULAR | Status: AC
  Administered 2014-02-17: 2500 [IU] via INTRAVENOUS_CENTRAL
  Filled 2014-02-17: qty 3

## 2014-02-17 MED ORDER — ALTEPLASE 2 MG IJ SOLR
INTRAMUSCULAR | Status: AC
  Administered 2014-02-17: 4 mg
  Filled 2014-02-17: qty 4

## 2014-02-17 MED ORDER — CEFAZOLIN SODIUM-DEXTROSE 2-3 GM-% IV SOLR
2.0000 g | INTRAVENOUS | Status: AC
  Filled 2014-02-17: qty 50

## 2014-02-17 MED ORDER — STERILE WATER FOR INJECTION IJ SOLN
INTRAMUSCULAR | Status: AC
  Administered 2014-02-17: 10 mL
  Filled 2014-02-17: qty 10

## 2014-02-17 MED ORDER — SODIUM CHLORIDE 0.9 % IV SOLN: 100.0000 mL | INTRAVENOUS | Status: DC | PRN

## 2014-02-17 MED ORDER — SODIUM CHLORIDE 0.9 % IJ SOLN
10.0000 mL | INTRAMUSCULAR | Status: DC | PRN
  Administered 2014-02-17 – 2014-02-21 (×7): 10 mL via INTRAVENOUS
  Filled 2014-02-17 (×7): qty 10

## 2014-02-17 MED ORDER — COLCHICINE 0.6 MG PO TABS
0.6000 mg | ORAL_TABLET | Freq: Once | ORAL | Status: AC
  Administered 2014-02-17: 0.6 mg via ORAL
  Filled 2014-02-17: qty 1

## 2014-02-17 MED ORDER — TUBERCULIN PPD 5 UNIT/0.1ML ID SOLN
5.0000 [IU] | Freq: Once | INTRADERMAL | Status: AC
  Administered 2014-02-17: 5 [IU] via INTRADERMAL
  Filled 2014-02-17: qty 0.1

## 2014-02-17 NOTE — Care Management Utilization Note (Signed)
UR completed 

## 2014-02-17 NOTE — Progress Notes (Signed)
tpa removed, pt cvc still not functioning well, sluggish arterial port.  Treatment started post  cathflo with lines reversed. CVC running at 200 as prescribed.  Pt received 2.5 hour treatment with net 1.1 ltr removed  Hep status drawn 3 gold tops sent to lab.

## 2014-02-17 NOTE — Clinical Social Work Psychosocial (Signed)
Clinical Social Work Department BRIEF PSYCHOSOCIAL ASSESSMENT 02/17/2014  Patient:  Jack Irwin,Jack Irwin     Account Number:  [id number]     Admit date:  02/16/2014  Clinical Social Worker:  STULTZ,KARA, LCSW  Date/Time:  02/17/2014 04:00 PM  Referred by:  CSW  Date Referred:  02/17/2014 Referred for  Other - See comment   Other Referral:   outpatient dialysis   Interview type:  Patient Other interview type:   wife    PSYCHOSOCIAL DATA Living Status:  FAMILY Admitted from facility:   Level of care:   Primary support name:  Jack Irwin Primary support relationship to patient:  SPOUSE Degree of support available:   supportive    CURRENT CONCERNS Current Concerns  Other - See comment   Other Concerns:    SOCIAL WORK ASSESSMENT / PLAN CSW met with pt and pt's wife at bedside. Pt alert and oriented and known to CSW from previous admissions. He states he is doing much better at home than in the past and is independent with ADLs. Pt will now require dialysis. CSW discussed this with him. He states this is happening much sooner than he thought. CSW provided support regarding adjustment. Pt said that his wife is going back to work, but she plans to work her schedule around his dialysis. Pt requests [facility] and will have transportation.   Assessment/plan status:  Referral to Community Resources Other assessment/ plan:   Information/referral to community resources:   [facility]    PATIENT'S/FAMILY'S RESPONSE TO PLAN OF CARE: Pt plans to return home at [place]/c. CSW will initiate outpatient dialysis referral to [facility].       Kara Stultz, LCSW [phone] 

## 2014-02-17 NOTE — Procedures (Signed)
   HEMODIALYSIS TREATMENT NOTE:  1st ever emergent dialysis process performed last night, 02/16/14 by Joyce Copa, RN.  Reported access problems.  Catheter occluded after 1 hour of initial HD, required activase to restore flow.  Alteplase instilled in both catheter ports for 1.5 hours prior to HD today.  Both catheter ports aspirated and flushed without resistance at onset of treatment.  2500u of heparin was given with initiation of HD.  Max Qb 270 due to excessively negative arterial pressures.  Lines reversed with no improvement.  Circuit flushed with NS every 30 minutes.  After 45 minutes without issues, arterial pressures began fluctuating widely.  Every 15 minutes Qb was stopped, bloodlines were disconnected, catheter limbs were flushed with 10cc, bloodlines were reconnected, and Qb was resumed.  This pattern repeated until catheter eventually would not maintain any sustained blood flow.  All blood was reinfused and Dr. Lowanda Foster was paged.  PLAN:  Tunneled catheter placement tomorrow.  Dr. Lowanda Foster will decide tomorrow about when next HD should be.    Total HD time:  1.5 hours Net UF = 1.1 liters.  Report given to Penni Homans, RN.  Patryce Depriest L. Genella Bas, RN, CDN

## 2014-02-17 NOTE — Progress Notes (Signed)
Pt's temporary HD catheter is providing sluggish blood return which is making hemodialysis treatments difficult. The nephrologist following pt's case has ordered that pt have a new catheter inserted at [place] to the PA over at [facility] regarding pt getting [facility] placed at [facility] she states that the MD at [facility] will be able to place a new catheter tomorrow (02/18/14). Pt is to be transferred to [facility] by 0830 tomorrow morning (02/18/14) via [facility]. [facility] has been called and transportation has been set up. Will continue to monitor.

## 2014-02-17 NOTE — Progress Notes (Signed)
Subjective: Interval History: has no complaint of nausea or vomiting. Patient also denies any difficulty breathing. He said he is feeling much better this morning..  Objective: Vital signs in last 24 hours: Temp:  [97.9 F (36.6 C)-98.5 F (36.9 C)] 98.1 F (36.7 C) (01/15 0400) Pulse Rate:  [58-83] 59 (01/15 0800) Resp:  [10-24] 10 (01/15 0800) BP: (111-174)/(68-110) 142/84 mmHg (01/15 0800) SpO2:  [94 %-100 %] 100 % (01/15 0800) Weight:  [122.471 kg (270 lb)-125.9 kg (277 lb 9 oz)] 125.9 kg (277 lb 9 oz) (01/15 0500) Weight change:   Intake/Output from previous day: 01/14 0701 - 01/15 0700 In: -  Out: 875 [Urine:875] Intake/Output this shift:    General appearance: alert, cooperative and no distress Resp: diminished breath sounds bilaterally Cardio: regular rate and rhythm, S1, S2 normal, no murmur, click, rub or gallop GI: Obese, nontender and positive bowel sounds Extremities: edema 1-2+ edema  Lab Results:  Recent Labs  02/16/14 1220 02/16/14 1425 02/17/14 0524  WBC 10.3  --  6.7  HGB 12.2* 13.3 10.7*  HCT 39.0 39.0 33.2*  PLT 190  --  126*   BMET:  Recent Labs  02/16/14 2140 02/17/14 0524  NA 137 139  K 5.3* 5.3*  CL 109 110  CO2 19 23  GLUCOSE 83 93  BUN 91* 87*  CREATININE 5.89* 6.31*  CALCIUM 7.7* 8.0*   No results for input(s): PTH in the last 72 hours. Iron Studies: No results for input(s): IRON, TIBC, TRANSFERRIN, FERRITIN in the last 72 hours.  Studies/Results: Dg Abd Acute W/chest  02/16/2014   CLINICAL DATA:  Generalized abdominal pain and nausea with vomiting since this morning.  EXAM: ACUTE ABDOMEN SERIES (ABDOMEN 2 VIEW & CHEST 1 VIEW)  COMPARISON:  Chest radiographs 06/25/2013 and abdominal series 03/28/2013  FINDINGS: Cardiac silhouette is mildly accentuated by hypoinflation but does not appear significantly changed from the prior study and is likely upper limits of normal limits in size. No airspace consolidation, edema, pleural  effusion, or pneumothorax is identified.  There is no evidence of intraperitoneal free air. Gas and stool are present throughout nondilated colon. A small amount of gas is seen in a few scattered loops of grossly nondilated small bowel, although evaluation for obstruction is limited by the overall paucity of small bowel. Small calcifications in the pelvis likely represent phleboliths. Thoracolumbar osteophytosis is noted.  IMPRESSION: 1. No evidence of acute airspace disease. 2. No definite evidence of bowel obstruction, however evaluation is somewhat limited by a paucity of small bowel gas.   Electronically Signed   By: Logan Bores   On: 02/16/2014 14:22    I have reviewed the patient's current medications.  Assessment/Plan: Problem #1 end-stage renal disease: Patient status post hemodialysis last night. His BUN is 87 and creatinine 6.31. Presently patient is asymptomatic. Problem #2 hyperkalemia: Patient came with potassium of 8.2. Presently is 5.3: It has improved. Problem #3 hypertension: His blood pressure is reasonably controlled Problem #4 anemia: His hemoglobin and hematocrit is within our target range. Problem #5 history of diabetes Problem #6 metabolic bone disease: His calcium and phosphorus is range Problem #7 morbid obesity Problem #8 right bundle branch block. Plan: We'll make arrangements for patient to get dialysis today for 4 hours. We'll put PPD We'll make arrangement for patient to get another catheter placement. We'll check his basic metabolic panel and CBC in the morning.     LOS: 1 day   Kelsee Preslar S 02/17/2014,8:16 AM

## 2014-02-17 NOTE — Progress Notes (Signed)
[facility] PROGRESS NOTE  Jack Irwin [id number] DOB: 1965-08-23 DOA: 02/16/2014 PCP: [name], MD  Assessment/Plan: Hyperkalemia -Due to CKD Stage V. -Improved after HD yesterday.  ESRD -New HD start. -Temp catheter not functioning appropriately. -Will attempt HD again today thru temporary catheter. -?plans for permcath vs fistula: will discuss with Dr. Lowanda Foster.  HTN -Fair control. -Should continue to improve with HD.  Obesity  Code Status: Full Code Family Communication: Patient only  Disposition Plan: To be dtermined   Consultants:  Renal   Antibiotics:  None   Subjective: No complaints. Wants to know the exact plan regarding HD and catheter placement.  Objective: Filed Vitals:   02/17/14 1145 02/17/14 1200 02/17/14 1215 02/17/14 1230  BP: 142/87 158/89 140/86 133/80  Pulse: 71 65 74 68  Temp:      TempSrc:      Resp: 18 15 12 14   Height:      Weight:      SpO2: 100% 100% 100% 100%    Intake/Output Summary (Last 24 hours) at 02/17/14 1248 Last data filed at 02/17/14 1010  Gross per 24 hour  Intake    400 ml  Output    875 ml  Net   -475 ml   Filed Weights   02/16/14 1204 02/16/14 1958 02/17/14 0500  Weight: 122.471 kg (270 lb) 124.739 kg (275 lb) 125.9 kg (277 lb 9 oz)    Exam:   General:  AA Ox3  Cardiovascular: RRR  Respiratory: CTA B  Abdomen: obese/S/NT/ND/+BS  Extremities: 1+ edema bilaterally   Neurologic:  Non-focal  Data Reviewed: Basic Metabolic Panel:  Recent Labs Lab 02/16/14 1220 02/16/14 1425 02/16/14 1615 02/16/14 2140 02/17/14 0524  NA CANCELLED BY LAB 137  --  137 139  K  --  8.2* 7.8* 5.3* 5.3*  CL  --  112  --  109 110  CO2  --   --   --  19 23  GLUCOSE  --  82  --  83 93  BUN  --  125*  --  91* 87*  CREATININE  --  6.50*  --  5.89* 6.31*  CALCIUM  --   --   --  7.7* 8.0*  PHOS  --   --   --   --  5.6*   Liver Function Tests:  Recent Labs Lab 02/17/14 0524    AST 26  ALT 18  ALKPHOS 70  BILITOT 1.1  PROT 6.8  ALBUMIN 3.1*    Recent Labs Lab 02/16/14 1220  LIPASE CANCELLED BY LAB   No results for input(s): AMMONIA in the last 168 hours. CBC:  Recent Labs Lab 02/16/14 1220 02/16/14 1425 02/17/14 0524  WBC 10.3  --  6.7  NEUTROABS 8.4*  --   --   HGB 12.2* 13.3 10.7*  HCT 39.0 39.0 33.2*  MCV 87.6  --  87.1  PLT 190  --  126*   Cardiac Enzymes: No results for input(s): CKTOTAL, CKMB, CKMBINDEX, TROPONINI in the last 168 hours. BNP (last 3 results) No results for input(s): PROBNP in the last 8760 hours. CBG: No results for input(s): GLUCAP in the last 168 hours.  Recent Results (from the past 240 hour(s))  MRSA PCR Screening     Status: None   Collection Time: 02/16/14  7:30 PM  Result Value Ref Range Status   MRSA by PCR NEGATIVE NEGATIVE Final    Comment:        The  GeneXpert MRSA Assay (FDA approved for NASAL specimens only), is one component of a comprehensive MRSA colonization surveillance program. It is not intended to diagnose MRSA infection nor to guide or monitor treatment for MRSA infections.      Studies: Dg Abd Acute W/chest  02/16/2014   CLINICAL DATA:  Generalized abdominal pain and nausea with vomiting since this morning.  EXAM: ACUTE ABDOMEN SERIES (ABDOMEN 2 VIEW & CHEST 1 VIEW)  COMPARISON:  Chest radiographs 06/25/2013 and abdominal series 03/28/2013  FINDINGS: Cardiac silhouette is mildly accentuated by hypoinflation but does not appear significantly changed from the prior study and is likely upper limits of normal limits in size. No airspace consolidation, edema, pleural effusion, or pneumothorax is identified.  There is no evidence of intraperitoneal free air. Gas and stool are present throughout nondilated colon. A small amount of gas is seen in a few scattered loops of grossly nondilated small bowel, although evaluation for obstruction is limited by the overall paucity of small bowel. Small  calcifications in the pelvis likely represent phleboliths. Thoracolumbar osteophytosis is noted.  IMPRESSION: 1. No evidence of acute airspace disease. 2. No definite evidence of bowel obstruction, however evaluation is somewhat limited by a paucity of small bowel gas.   Electronically Signed   By: Logan Bores   On: 02/16/2014 14:22    Scheduled Meds: . alteplase      . calcitRIOL  0.25 mcg Oral Daily  . calcium acetate  667 mg Oral TID WC  . enoxaparin (LOVENOX) injection  30 mg Subcutaneous Q24H  . febuxostat  80 mg Oral Daily  . heparin      . heparin  20 Units/kg Dialysis Once in dialysis  . metoprolol tartrate  37.5 mg Oral BID  . sodium bicarbonate  650 mg Oral TID  . sodium chloride  3 mL Intravenous Q12H  . sodium chloride      . sterile water (preservative free)      . tuberculin  5 Units Intradermal Once   Continuous Infusions: . sodium chloride      Active Problems:   Hyperkalemia   Hypertension   Chronic kidney disease (CKD), stage IV (severe)   Morbid obesity    Time spent: 35 minutes. Greater than 50% of this time was spent in direct contact with the patient coordinating care.    Lelon Frohlich  [facility] Pager [phone]  If 7PM-7AM, please contact night-coverage at [email or web address], password [facility] 02/17/2014, 12:48 PM  LOS: 1 day

## 2014-02-17 NOTE — Progress Notes (Signed)
Tuberculin injection given to pt in right forearm, site marked with skin pen. Good wheel formation noted. Will continue to monitor over 48 hours.

## 2014-02-18 ENCOUNTER — Ambulatory Visit (HOSPITAL_COMMUNITY)
Admit: 2014-02-18 | Discharge: 2014-02-18 | Disposition: A | Discharge status: Home / Self Care | Payer: Medicare Other | Attending: Internal Medicine | Admitting: Internal Medicine

## 2014-02-18 DIAGNOSIS — N186 End stage renal disease: Secondary | ICD-10-CM

## 2014-02-18 DIAGNOSIS — Z4901 Encounter for fitting and adjustment of extracorporeal dialysis catheter: Secondary | ICD-10-CM

## 2014-02-18 DIAGNOSIS — E119 Type 2 diabetes mellitus without complications: Secondary | ICD-10-CM

## 2014-02-18 DIAGNOSIS — M109 Gout, unspecified: Secondary | ICD-10-CM

## 2014-02-18 DIAGNOSIS — Z7952 Long term (current) use of systemic steroids: Secondary | ICD-10-CM | POA: Insufficient documentation

## 2014-02-18 DIAGNOSIS — Z79899 Other long term (current) drug therapy: Secondary | ICD-10-CM | POA: Insufficient documentation

## 2014-02-18 DIAGNOSIS — Z992 Dependence on renal dialysis: Secondary | ICD-10-CM

## 2014-02-18 DIAGNOSIS — Z79891 Long term (current) use of opiate analgesic: Secondary | ICD-10-CM

## 2014-02-18 DIAGNOSIS — I120 Hypertensive chronic kidney disease with stage 5 chronic kidney disease or end stage renal disease: Secondary | ICD-10-CM | POA: Insufficient documentation

## 2014-02-18 LAB — GLUCOSE, CAPILLARY
GLUCOSE-CAPILLARY: 108 mg/dL — AB (ref 70–99)
GLUCOSE-CAPILLARY: 44 mg/dL — AB (ref 70–99)
Glucose-Capillary: 60 mg/dL — ABNORMAL LOW (ref 70–99)
Glucose-Capillary: 46 mg/dL — ABNORMAL LOW (ref 70–99)
Glucose-Capillary: 49 mg/dL — ABNORMAL LOW (ref 70–99)
Glucose-Capillary: 51 mg/dL — ABNORMAL LOW (ref 70–99)
Glucose-Capillary: 58 mg/dL — ABNORMAL LOW (ref 70–99)
Glucose-Capillary: 42 mg/dL — CL (ref 70–99)
Glucose-Capillary: 75 mg/dL (ref 70–99)

## 2014-02-18 LAB — HEPATITIS B SURFACE ANTIBODY, QUANTITATIVE: HEPATITIS B-POST: 3.1 m[IU]/mL — AB

## 2014-02-18 LAB — BASIC METABOLIC PANEL WITH GFR
Anion gap: 7 (ref 5–15)
BUN: 74 mg/dL — ABNORMAL HIGH (ref 6–23)
CO2: 24 mmol/L (ref 19–32)
Calcium: 8.2 mg/dL — ABNORMAL LOW (ref 8.4–10.5)
Chloride: 108 meq/L (ref 96–112)
Creatinine, Ser: 6.29 mg/dL — ABNORMAL HIGH (ref 0.50–1.35)
GFR calc Af Amer: 11 mL/min — ABNORMAL LOW (ref >90)
GFR calc non Af Amer: 10 mL/min — ABNORMAL LOW (ref >90)
Glucose, Bld: 85 mg/dL (ref 70–99)
Potassium: 5.4 mmol/L — ABNORMAL HIGH (ref 3.5–5.1)
Sodium: 139 mmol/L (ref 135–145)

## 2014-02-18 LAB — CBC
HCT: 34.6 % — ABNORMAL LOW (ref 39.0–52.0)
Hemoglobin: 11.1 g/dL — ABNORMAL LOW (ref 13.0–17.0)
MCH: 28.4 pg (ref 26.0–34.0)
MCHC: 32.1 g/dL (ref 30.0–36.0)
MCV: 88.5 fL (ref 78.0–100.0)
Platelets: 91 10*3/uL — ABNORMAL LOW (ref 150–400)
RBC: 3.91 MIL/uL — AB (ref 4.22–5.81)
RDW: 17 % — ABNORMAL HIGH (ref 11.5–15.5)
WBC: 5.7 10*3/uL (ref 4.0–10.5)

## 2014-02-18 LAB — PROTIME-INR
INR: 1.12 (ref 0.00–1.49)
PROTHROMBIN TIME: 14.5 s (ref 11.6–15.2)

## 2014-02-18 MED ORDER — GLUCOSE-VITAMIN C 4-6 GM-MG PO CHEW
CHEWABLE_TABLET | ORAL | Status: AC
  Filled 2014-02-18: qty 1

## 2014-02-18 MED ORDER — CEFAZOLIN SODIUM-DEXTROSE 2-3 GM-% IV SOLR
INTRAVENOUS | Status: AC
  Filled 2014-02-18: qty 50

## 2014-02-18 MED ORDER — HEPARIN SODIUM (PORCINE) 1000 UNIT/ML IJ SOLN
INTRAMUSCULAR | Status: AC
  Filled 2014-02-18: qty 1

## 2014-02-18 MED ORDER — MIDAZOLAM HCL 2 MG/2ML IJ SOLN
INTRAMUSCULAR | Status: AC
  Filled 2014-02-18: qty 4

## 2014-02-18 MED ORDER — ONDANSETRON HCL 4 MG/2ML IJ SOLN
4.0000 mg | Freq: Four times a day (QID) | INTRAMUSCULAR | Status: DC | PRN
  Administered 2014-02-18 – 2014-02-19 (×2): 4 mg via INTRAMUSCULAR
  Filled 2014-02-18 (×2): qty 2

## 2014-02-18 MED ORDER — FENTANYL CITRATE 0.05 MG/ML IJ SOLN
INTRAMUSCULAR | Status: AC
  Filled 2014-02-18: qty 4

## 2014-02-18 MED ORDER — FENTANYL CITRATE 0.05 MG/ML IJ SOLN
INTRAMUSCULAR | Status: DC | PRN
  Administered 2014-02-18: 50 ug via INTRAVENOUS

## 2014-02-18 MED ORDER — DEXTROSE 50 % IV SOLN
INTRAVENOUS | Status: AC
  Filled 2014-02-18: qty 50

## 2014-02-18 MED ORDER — SODIUM CHLORIDE 0.9 % IJ SOLN
INTRAMUSCULAR | Status: AC
  Administered 2014-02-18: 10 mL via INTRAVENOUS
  Filled 2014-02-18: qty 12

## 2014-02-18 MED ORDER — CEFAZOLIN SODIUM-DEXTROSE 2-3 GM-% IV SOLR
2.0000 g | Freq: Once | INTRAVENOUS | Status: AC
  Administered 2014-02-18: 2 g via INTRAVENOUS

## 2014-02-18 MED ORDER — ONDANSETRON HCL 4 MG/2ML IJ SOLN: 4.0000 mg | Freq: Four times a day (QID) | INTRAMUSCULAR | Status: DC | PRN

## 2014-02-18 MED ORDER — GLUCOSE 4 G PO CHEW
3.0000 | CHEWABLE_TABLET | Freq: Once | ORAL | Status: AC
  Administered 2014-02-18: 12 g via ORAL

## 2014-02-18 MED ORDER — LIDOCAINE HCL 1 % IJ SOLN
INTRAMUSCULAR | Status: AC
  Filled 2014-02-18: qty 20

## 2014-02-18 MED ORDER — GELATIN ABSORBABLE 12-7 MM EX MISC
CUTANEOUS | Status: AC
  Filled 2014-02-18: qty 1

## 2014-02-18 MED ORDER — MIDAZOLAM HCL 2 MG/2ML IJ SOLN
INTRAMUSCULAR | Status: DC | PRN
  Administered 2014-02-18: 1 mg via INTRAVENOUS

## 2014-02-18 MED ORDER — DEXTROSE 50 % IV SOLN
1.0000 | Freq: Once | INTRAVENOUS | Status: AC
  Administered 2014-02-18: 50 mL via INTRAVENOUS

## 2014-02-18 MED ORDER — HEPARIN SODIUM (PORCINE) 1000 UNIT/ML IJ SOLN
INTRAMUSCULAR | Status: AC
  Administered 2014-02-18: 4100 [IU] via INTRAVENOUS_CENTRAL
  Filled 2014-02-18: qty 6

## 2014-02-18 NOTE — Sedation Documentation (Signed)
O2 d/c'd 

## 2014-02-18 NOTE — Progress Notes (Signed)
Patient stated he is scheduled to have a fistula placed in his left arm on Tuesday 02/21/14, Wants to know if they can do both the temporary permacath and the fistula at the same time. Also wanted to know if he can stay awake for procedure.

## 2014-02-18 NOTE — Procedures (Signed)
Placement of right jugular Perm Cath.  Tip in right atrium.  Catheter is ready to use.  No immediate complication.

## 2014-02-18 NOTE — Progress Notes (Signed)
[facility] PROGRESS NOTE  Jack Irwin [id number] DOB: 28-Dec-1965 DOA: 02/16/2014 PCP: [name], MD  Assessment/Plan: Hyperkalemia -Due to CKD Stage V. -Improved after HD. -K is 5.4 today.  ESRD -New HD start. -S/p tunneled HD cath placement 1/16 by IR. -Awaiting CLIP process prior to DC.  HTN -Fair control. -Should continue to improve with HD.  Obesity  Hypoglycemia -No h/o DM. -Likely related to being NPO for procedure. -Check CBGs more frequently.  Code Status: Full Code Family Communication: Patient only  Disposition Plan: DC home once CLIP process complete and OP HD slot secured.   Consultants:  Renal   Antibiotics:  None   Subjective: No complaints.   Objective: Filed Vitals:   02/18/14 0800 02/18/14 0805 02/18/14 1300 02/18/14 1511  BP: 136/80  159/91 136/92  Pulse: 52  80 60  Temp:  97.7 F (36.5 C)  97.8 F (36.6 C)  TempSrc:  Oral    Resp: 13  16 18   Height:      Weight:      SpO2: 100%  100% 100%    Intake/Output Summary (Last 24 hours) at 02/18/14 1612 Last data filed at 02/18/14 1611  Gross per 24 hour  Intake    240 ml  Output    775 ml  Net   -535 ml   Filed Weights   02/16/14 1204 02/16/14 1958 02/17/14 0500  Weight: 122.471 kg (270 lb) 124.739 kg (275 lb) 125.9 kg (277 lb 9 oz)    Exam:   General:  AA Ox3  Cardiovascular: RRR  Respiratory: CTA B  Abdomen: obese/S/NT/ND/+BS  Extremities: 1+ edema bilaterally   Neurologic:  Non-focal  Data Reviewed: Basic Metabolic Panel:  Recent Labs Lab 02/16/14 1220 02/16/14 1425 02/16/14 1615 02/16/14 2140 02/17/14 0524 02/18/14 0444  NA CANCELLED BY LAB 137  --  137 139 139  K  --  8.2* 7.8* 5.3* 5.3* 5.4*  CL  --  112  --  109 110 108  CO2  --   --   --  19 23 24   GLUCOSE  --  82  --  83 93 85  BUN  --  125*  --  91* 87* 74*  CREATININE  --  6.50*  --  5.89* 6.31* 6.29*  CALCIUM  --   --   --  7.7* 8.0* 8.2*  PHOS  --   --   --    --  5.6*  --    Liver Function Tests:  Recent Labs Lab 02/17/14 0524  AST 26  ALT 18  ALKPHOS 70  BILITOT 1.1  PROT 6.8  ALBUMIN 3.1*    Recent Labs Lab 02/16/14 1220  LIPASE CANCELLED BY LAB   No results for input(s): AMMONIA in the last 168 hours. CBC:  Recent Labs Lab 02/16/14 1220 02/16/14 1425 02/17/14 0524 02/18/14 0444  WBC 10.3  --  6.7 5.7  NEUTROABS 8.4*  --   --   --   HGB 12.2* 13.3 10.7* 11.1*  HCT 39.0 39.0 33.2* 34.6*  MCV 87.6  --  87.1 88.5  PLT 190  --  126* 91*   Cardiac Enzymes: No results for input(s): CKTOTAL, CKMB, CKMBINDEX, TROPONINI in the last 168 hours. BNP (last 3 results) No results for input(s): PROBNP in the last 8760 hours. CBG:  Recent Labs Lab 02/18/14 1109 02/18/14 1115 02/18/14 1127 02/18/14 1132 02/18/14 1142  GLUCAP 44* 58* 51* 42* 75    Recent Results (  from the past 240 hour(s))  MRSA PCR Screening     Status: None   Collection Time: 02/16/14  7:30 PM  Result Value Ref Range Status   MRSA by PCR NEGATIVE NEGATIVE Final    Comment:        The GeneXpert MRSA Assay (FDA approved for NASAL specimens only), is one component of a comprehensive MRSA colonization surveillance program. It is not intended to diagnose MRSA infection nor to guide or monitor treatment for MRSA infections.      Studies: Ir Fluoro Guide Cv Line Right  02/18/2014   INDICATION: 49 year old with end-stage renal disease. Patient needs a tunneled dialysis catheter for hemodialysis. Patient currently has a non tunneled right groin catheter.  EXAM: FLUOROSCOPIC AND ULTRASOUND GUIDED PLACEMENT OF A TUNNELED DIALYSIS CATHETER  Physician: Stephan Minister. Henn, MD  FLUOROSCOPY TIME:  6 seconds  MEDICATIONS: 1 mg versed, 50 mcg fentanyl. A radiology nurse monitored the patient for moderate sedation. As antibiotic prophylaxis, Ancef was ordered pre-procedure and administered intravenously within one hour of incision.  ANESTHESIA/SEDATION: Moderate sedation  time: 28 minutes  PROCEDURE: Informed consent was obtained for placement of a tunneled dialysis catheter. Specifically, the risks of bleeding and infection were discussed with the patient. The patient was placed supine on the interventional table. Ultrasound confirmed a patent right internal jugularvein. Ultrasound images were obtained for documentation. The right side of the neck was prepped and draped in a sterile fashion. The right side of the neck was anesthetized with 1% lidocaine. Maximal barrier sterile technique was utilized including caps, mask, sterile gowns, sterile gloves, sterile drape, hand hygiene and skin antiseptic. A small incision was made with #11 blade scalpel. A 21 gauge needle directed into the right internal jugular vein with ultrasound guidance. A micropuncture dilator set was placed. A 23 cm tip to cuff HemoSplit catheter was selected. The skin below the right clavicle was anesthetized and a small incision was made with an #11 blade scalpel. A subcutaneous tunnel was formed to the vein dermatotomy site. The catheter was brought through the tunnel. The vein dermatotomy site was dilated to accommodate a peel-away sheath. The catheter was placed through the peel-away sheath and directed into the central venous structures. The tip of the catheter was placed in the right atrium with fluoroscopy. Fluoroscopic images were obtained for documentation. Both lumens were found to aspirate and flush well. The proper amount of heparin was flushed in both lumens. The vein dermatotomy site was closed using a single layer of absorbable suture and Dermabond. The catheter was secured to the skin using Prolene suture. Gel-Foam was placed in subcutaneous tunnel. Bandage placed over the catheter site.  FINDINGS: Catheter tip in the right atrium.  COMPLICATIONS: None  IMPRESSION: Successful placement of a right jugular tunneled dialysis catheter using ultrasound and fluoroscopic guidance.   Electronically Signed    By: Markus Daft M.D.   On: 02/18/2014 13:18   Ir US Guide Vasc Access Right  02/18/2014   INDICATION: 49 year old with end-stage renal disease. Patient needs a tunneled dialysis catheter for hemodialysis. Patient currently has a non tunneled right groin catheter.  EXAM: FLUOROSCOPIC AND ULTRASOUND GUIDED PLACEMENT OF A TUNNELED DIALYSIS CATHETER  Physician: Stephan Minister. Henn, MD  FLUOROSCOPY TIME:  6 seconds  MEDICATIONS: 1 mg versed, 50 mcg fentanyl. A radiology nurse monitored the patient for moderate sedation. As antibiotic prophylaxis, Ancef was ordered pre-procedure and administered intravenously within one hour of incision.  ANESTHESIA/SEDATION: Moderate sedation time: 28 minutes  PROCEDURE:  Informed consent was obtained for placement of a tunneled dialysis catheter. Specifically, the risks of bleeding and infection were discussed with the patient. The patient was placed supine on the interventional table. Ultrasound confirmed a patent right internal jugularvein. Ultrasound images were obtained for documentation. The right side of the neck was prepped and draped in a sterile fashion. The right side of the neck was anesthetized with 1% lidocaine. Maximal barrier sterile technique was utilized including caps, mask, sterile gowns, sterile gloves, sterile drape, hand hygiene and skin antiseptic. A small incision was made with #11 blade scalpel. A 21 gauge needle directed into the right internal jugular vein with ultrasound guidance. A micropuncture dilator set was placed. A 23 cm tip to cuff HemoSplit catheter was selected. The skin below the right clavicle was anesthetized and a small incision was made with an #11 blade scalpel. A subcutaneous tunnel was formed to the vein dermatotomy site. The catheter was brought through the tunnel. The vein dermatotomy site was dilated to accommodate a peel-away sheath. The catheter was placed through the peel-away sheath and directed into the central venous structures. The tip  of the catheter was placed in the right atrium with fluoroscopy. Fluoroscopic images were obtained for documentation. Both lumens were found to aspirate and flush well. The proper amount of heparin was flushed in both lumens. The vein dermatotomy site was closed using a single layer of absorbable suture and Dermabond. The catheter was secured to the skin using Prolene suture. Gel-Foam was placed in subcutaneous tunnel. Bandage placed over the catheter site.  FINDINGS: Catheter tip in the right atrium.  COMPLICATIONS: None  IMPRESSION: Successful placement of a right jugular tunneled dialysis catheter using ultrasound and fluoroscopic guidance.   Electronically Signed   By: Markus Daft M.D.   On: 02/18/2014 13:18    Scheduled Meds: . calcitRIOL  0.25 mcg Oral Daily  . calcium acetate  667 mg Oral TID WC  .  ceFAZolin (ANCEF) IV  2 g Intravenous On Call  . enoxaparin (LOVENOX) injection  30 mg Subcutaneous Q24H  . febuxostat  80 mg Oral Daily  . metoprolol tartrate  37.5 mg Oral BID  . sodium bicarbonate  650 mg Oral TID  . sodium chloride  3 mL Intravenous Q12H  . tuberculin  5 Units Intradermal Once   Continuous Infusions: . sodium chloride      Active Problems:   Hyperkalemia   Hypertension   Chronic kidney disease (CKD), stage IV (severe)   Morbid obesity    Time spent: 35 minutes. Greater than 50% of this time was spent in direct contact with the patient coordinating care.    Lelon Frohlich  [facility] Pager [phone]  If 7PM-7AM, please contact night-coverage at [email or web address], password [facility] 02/18/2014, 4:12 PM  LOS: 2 days

## 2014-02-18 NOTE — Sedation Documentation (Signed)
O2 2L/ started 

## 2014-02-18 NOTE — Consult Note (Signed)
Chief Complaint: Scheduled for tunneled dialysis catheter.  Referring Physician(s): Lelon Frohlich Y  History of Present Illness: Jack Irwin is a 49 y.o. male with renal failure and starting hemodialysis.  Patient is an inpatient at [facility] and transferred to [facility] for new dialysis access.  Currently, patient has a right groin temp cath and needs tunneled access for long term use.  Patient has no complaints at this time.    Past Medical History  Diagnosis Date  . Hypertension   . Diabetes mellitus   . Gout   . Chronic back pain   . BPH (benign prostatic hyperplasia)   . Anemia   . DVT (deep venous thrombosis)   . Neuropathy   . Decubitus ulcer     of 2nd toes of both feet.  . Edema leg   . Constipation   . Venous (peripheral) insufficiency   . Neuropathy, diabetic   . Physical deconditioning   . Poor balance   . Difficulty walking   . Lack of coordination   . Arthritis   . Renal insufficiency   . Chronic kidney disease   . Chronic kidney disease (CKD), stage IV (severe)     Past Surgical History  Procedure Laterality Date  . None      Allergies: Review of patient's allergies indicates no known allergies.  Medications: Prior to Admission medications   Medication Sig Start Date End Date Taking? Authorizing Provider  Alum & Mag Hydroxide-Simeth (MAGIC MOUTHWASH W/LIDOCAINE) SOLN Gargle and spit 10 mL every 4 hours prn throat pain.    Pharmacy - mix equal parts Patient not taking: Reported on 02/16/2014 06/25/13   Evalee Jefferson, PA-C  calcitRIOL (ROCALTROL) 0.25 MCG capsule Take 1 capsule by mouth daily.  06/15/13   Historical Provider, MD  calcium acetate (PHOSLO) 667 MG capsule Take 667 mg by mouth 3 (three) times daily with meals.     Historical Provider, MD  Cholecalciferol (VITAMIN D3) 1000 UNITS CAPS Take 1 capsule by mouth daily.    Historical Provider, MD  COD LIVER OIL PO Take 1 capsule by mouth daily.    Historical Provider, MD  colchicine  (COLCRYS) 0.6 MG tablet Take 1 tablet (0.6 mg total) by mouth daily. Patient taking differently: Take 0.6 mg by mouth daily as needed. For gout 10/04/13   [name], MD  febuxostat (ULORIC) 40 MG tablet Take 3 tablets (120 mg total) by mouth daily. Patient taking differently: Take 80 mg by mouth daily.  10/04/13   [name], MD  furosemide (LASIX) 40 MG tablet Take 1 tablet (40 mg total) by mouth daily as needed for fluid. 10/04/13   [name], MD  glucosamine-chondroitin 500-400 MG tablet Take 1 tablet by mouth daily as needed (bones).     Historical Provider, MD  metoprolol tartrate (LOPRESSOR) 25 MG tablet Take 37.5 mg by mouth 2 (two) times daily.  11/22/13   Historical Provider, MD  ondansetron (ZOFRAN ODT) 4 MG disintegrating tablet 4mg  ODT q4 hours prn nausea/vomit 02/16/14   Maudry Diego, MD  oxyCODONE-acetaminophen (PERCOCET) 5-325 MG per tablet Take 1-2 tablets by mouth every 4 (four) hours as needed. Patient taking differently: Take 1-2 tablets by mouth every 4 (four) hours as needed for moderate pain.  01/04/14   Nat Christen, MD  predniSONE (DELTASONE) 50 MG tablet 1 tablet daily for 4 days, one half tablet daily for 4 days Patient taking differently: Take 50 mg by mouth daily with breakfast. 1  tablet daily for 4 days, one half tablet daily for 4 days 01/04/14   Nat Christen, MD  sildenafil (VIAGRA) 100 MG tablet Take 100 mg by mouth daily as needed for erectile dysfunction.    Historical Provider, MD  sodium bicarbonate 650 MG tablet Take 650 mg by mouth 3 (three) times daily.    Historical Provider, MD  traMADol (ULTRAM) 50 MG tablet Take 1 tablet (50 mg total) by mouth every 6 (six) hours as needed. Patient taking differently: Take 50 mg by mouth every 6 (six) hours as needed for moderate pain.  12/13/13   Glendell Docker, NP    Family History  Problem Relation Age of Onset  . Diabetes Mother   . Hypertension Mother   . Heart failure Mother   . Hyperlipidemia Mother   . Heart  attack Mother   . Cancer Father   . Diabetes Father   . Hypertension Father   . Hyperlipidemia Father     History   Social History  . Marital Status: Married    Spouse Name: N/A    Number of Children: N/A  . Years of Education: N/A   Social History Main Topics  . Smoking status: Never Smoker   . Smokeless tobacco: Never Used  . Alcohol Use: No  . Drug Use: No  . Sexual Activity: Not on file   Other Topics Concern  . Not on file   Social History Narrative     Review of Systems  Constitutional: Negative.   Respiratory: Negative.   Cardiovascular: Negative.     Vital Signs: BP 165/94 mmHg  Pulse 56  Resp 15  SpO2 100%  Physical Exam  Constitutional: He appears well-developed and well-nourished.  Cardiovascular: Normal rate, regular rhythm and normal heart sounds.   Pulmonary/Chest: Breath sounds normal.    Imaging: Dg Abd Acute W/chest  02/16/2014   CLINICAL DATA:  Generalized abdominal pain and nausea with vomiting since this morning.  EXAM: ACUTE ABDOMEN SERIES (ABDOMEN 2 VIEW & CHEST 1 VIEW)  COMPARISON:  Chest radiographs 06/25/2013 and abdominal series 03/28/2013  FINDINGS: Cardiac silhouette is mildly accentuated by hypoinflation but does not appear significantly changed from the prior study and is likely upper limits of normal limits in size. No airspace consolidation, edema, pleural effusion, or pneumothorax is identified.  There is no evidence of intraperitoneal free air. Gas and stool are present throughout nondilated colon. A small amount of gas is seen in a few scattered loops of grossly nondilated small bowel, although evaluation for obstruction is limited by the overall paucity of small bowel. Small calcifications in the pelvis likely represent phleboliths. Thoracolumbar osteophytosis is noted.  IMPRESSION: 1. No evidence of acute airspace disease. 2. No definite evidence of bowel obstruction, however evaluation is somewhat limited by a paucity of small  bowel gas.   Electronically Signed   By: Logan Bores   On: 02/16/2014 14:22    Labs:  CBC:  Recent Labs  01/04/14 1446 02/16/14 1220 02/16/14 1425 02/17/14 0524 02/18/14 0444  WBC 12.6* 10.3  --  6.7 5.7  HGB 11.2* 12.2* 13.3 10.7* 11.1*  HCT 33.8* 39.0 39.0 33.2* 34.6*  PLT 196 190  --  126* 91*    COAGS:  Recent Labs  02/18/14 0444  INR 1.12    BMP:  Recent Labs  01/06/14 1146  02/16/14 1425 02/16/14 1615 02/16/14 2140 02/17/14 0524 02/18/14 0444  NA 139  < > 137  --  137 139 139  K  6.0*  --  8.2* 7.8* 5.3* 5.3* 5.4*  CL 103  --  112  --  109 110 108  CO2 22  --   --   --  19 23 24   GLUCOSE 79  --  82  --  83 93 85  BUN 70*  --  125*  --  91* 87* 74*  CALCIUM 8.9  --   --   --  7.7* 8.0* 8.2*  CREATININE 6.14*  --  6.50*  --  5.89* 6.31* 6.29*  GFRNONAA 10*  --   --   --  10* 10* 10*  GFRAA 11*  --   --   --  12* 11* 11*  < > = values in this interval not displayed.  LIVER FUNCTION TESTS:  Recent Labs  04/22/13 1801 02/17/14 0524  BILITOT 0.8 1.1  AST 30 26  ALT 14 18  ALKPHOS 115 70  PROT 8.0 6.8  ALBUMIN 3.5 3.1*    Assessment and Plan:  49 yo with ESRD and starting hemodialysis.  Plan for placement of chest tunneled dialysis catheter this morning.  The procedure benefits and risks discussed with patient and wife.  In particular, the risks of bleeding and infection were discussed.  Informed consent obtained.  Plan to give IV ancef prior to catheter insertion.         SignedCarylon Perches 02/18/2014, 9:34 AM

## 2014-02-18 NOTE — Sedation Documentation (Signed)
Continues to eat and take po's.  Completely asymptomatic with low CBG.  [facility] here awaiting higher sugar for transport.

## 2014-02-18 NOTE — Sedation Documentation (Signed)
Received to [facility] from [facility].  Here for [facility] HD cath.

## 2014-02-18 NOTE — Progress Notes (Signed)
Subjective: Interval History: Patient feels much better. His appetite is good and he doesn't have any nausea or vomiting. Patient also denies any difficulty breathing.  Objective: Vital signs in last 24 hours: Temp:  [97.7 F (36.5 C)-98.1 F (36.7 C)] 97.9 F (36.6 C) (01/15 2010) Pulse Rate:  [50-82] 63 (01/16 0600) Resp:  [10-21] 14 (01/16 0600) BP: (96-164)/(63-108) 162/98 mmHg (01/16 0600) SpO2:  [95 %-100 %] 95 % (01/16 0600) Weight change:   Intake/Output from previous day: 01/15 0701 - 01/16 0700 In: 760 [P.O.:760] Out: 1757 [Urine:650] Intake/Output this shift:    Generally he is alert and in no apparent distress. Chest is clear to auscultation no rales, or wheezing. His heart exam revealed regular rate and rhythm no murmur   abdomen obese, positive bowel sound Extremities no edema  Lab Results:  Recent Labs  02/17/14 0524 02/18/14 0444  WBC 6.7 5.7  HGB 10.7* 11.1*  HCT 33.2* 34.6*  PLT 126* 91*   BMET:   Recent Labs  02/16/14 2140 02/17/14 0524  NA 137 139  K 5.3* 5.3*  CL 109 110  CO2 19 23  GLUCOSE 83 93  BUN 91* 87*  CREATININE 5.89* 6.31*  CALCIUM 7.7* 8.0*   No results for input(s): PTH in the last 72 hours. Iron Studies: No results for input(s): IRON, TIBC, TRANSFERRIN, FERRITIN in the last 72 hours.  Studies/Results: Dg Abd Acute W/chest  02/16/2014   CLINICAL DATA:  Generalized abdominal pain and nausea with vomiting since this morning.  EXAM: ACUTE ABDOMEN SERIES (ABDOMEN 2 VIEW & CHEST 1 VIEW)  COMPARISON:  Chest radiographs 06/25/2013 and abdominal series 03/28/2013  FINDINGS: Cardiac silhouette is mildly accentuated by hypoinflation but does not appear significantly changed from the prior study and is likely upper limits of normal limits in size. No airspace consolidation, edema, pleural effusion, or pneumothorax is identified.  There is no evidence of intraperitoneal free air. Gas and stool are present throughout nondilated colon. A  small amount of gas is seen in a few scattered loops of grossly nondilated small bowel, although evaluation for obstruction is limited by the overall paucity of small bowel. Small calcifications in the pelvis likely represent phleboliths. Thoracolumbar osteophytosis is noted.  IMPRESSION: 1. No evidence of acute airspace disease. 2. No definite evidence of bowel obstruction, however evaluation is somewhat limited by a paucity of small bowel gas.   Electronically Signed   By: Logan Bores   On: 02/16/2014 14:22    I have reviewed the patient's current medications.  Assessment/Plan: Problem #1 end-stage renal disease: Status post short dialysis yesterday. Presently patient doesn't have any uremic signs and symptoms. Patient is for tunneled catheter placement Problem #2 hyperkalemia: As stated above patient was dialyzed yesterday. His potassium was 5.3. Blood work is pending from today. Problem #3 hypertension: His blood pressure is reasonably controlled Problem #4 anemia: His hemoglobin and hematocrit is within our target range. Stable. Problem #5 history of diabetes . This morning his blood sugar was 60. Patient however seems to be asymptomatic. Problem #6 metabolic bone disease: His calcium and phosphorus is range Problem #7 morbid obesity Problem #8 right bundle branch block. Plan: We'll give patient 1 amp of D50. Presently is nothing by mouth for his catheter placement. At this moment his basic metabolic panel is pending hence not sure whether his potassium is normal or high. We'll check his basic metabolic panel and CBC in the morning.     LOS: 2 days   [facility]  S 02/18/2014,7:57 AM

## 2014-02-18 NOTE — Progress Notes (Signed)
Pt returned from [facility] for placement of right jugular perm cath. Pt is alert and oriented with stable VS. Dressing on catheter is clean dry and intact, both lumens are capped off at this time. Pt has had trouble with low blood sugars earlier today, but when checked on arrival to floor CBG was 88. Pt received morning medications and is waiting on lunch tray to arrive. Pt complaining of pain in his right chest where the cath was placed, but otherwise has no new complaints. Will continue to monitor.

## 2014-02-18 NOTE — Sedation Documentation (Addendum)
Right femoral cath reheparinized 1000u/ml heparin 1.53ml  into proximal cath. By [facility].

## 2014-02-18 NOTE — Progress Notes (Signed)
Pt transferring to unit 300 today. Pt/family is aware and agreeable to transfer. Assessment is unchanged from this morning and receiving RN has been given report. Belongings sent with pt at bedside.  

## 2014-02-18 NOTE — Sedation Documentation (Signed)
Pt's glucose is 49.  Wide awake and alert, asymptomatic.  Taking apple juice and crackers and cookies.  [facility] here will check sugar again en route to [facility].  Dr Anselm Pancoast aware.

## 2014-02-18 NOTE — Progress Notes (Signed)
Checked pt's CBG this AM and he was found to be slightly hypoglycemic with a blood sugar of 60. Nephrologist was made aware and he ordered an AMP of D50 to be given to pt intervenously. PT's only IV access is he temporary hemodialysis catheter located in his right groin. MD is aware and states that nursing may give him the amp through the catheter. As pt's catheter uses heparin as an instilling agent 5cc of waste was pulled from proximal lumen. I then flushed proximal lumen with 10cc of sterile normal saline and then instilled the ampule of D50. I then flushed with 10cc of sterile normal saline after ampule was given. Will recheck pt's CBG in 15 minutes.

## 2014-02-18 NOTE — Plan of Care (Signed)
Problem: Phase I Progression Outcomes Goal: Initial discharge plan identified To home with spouse

## 2014-02-18 NOTE — Sedation Documentation (Addendum)
Dr Anselm Pancoast in to see pt.  Family at bedside.

## 2014-02-18 NOTE — Procedures (Signed)
   HEMODIALYSIS TREATMENT NOTE:  3.5 hour low-heparin dialysis (2K, 2.5Ca) completed via newly placed right IJ catheter (tips in SVC).  BFR decreased from 400 to 320 as treatment progressed, hopefully due to venospasm s/p cath insertion.  Tolerated removal of 2.5 liters without interruption in ultrafiltration.  All blood was reinfused.  C/o cramps in hands after dialysis; Heat packs given.  Report called to [place], Therapist, sports.  Tylene Quashie L. Shatasia Cutshaw, RN, CDN

## 2014-02-19 DIAGNOSIS — Z992 Dependence on renal dialysis: Secondary | ICD-10-CM

## 2014-02-19 DIAGNOSIS — N186 End stage renal disease: Secondary | ICD-10-CM

## 2014-02-19 LAB — CBC
HEMATOCRIT: 39.8 % (ref 39.0–52.0)
Hemoglobin: 12.5 g/dL — ABNORMAL LOW (ref 13.0–17.0)
MCH: 27.8 pg (ref 26.0–34.0)
MCHC: 31.4 g/dL (ref 30.0–36.0)
MCV: 88.6 fL (ref 78.0–100.0)
Platelets: 93 10*3/uL — ABNORMAL LOW (ref 150–400)
RBC: 4.49 MIL/uL (ref 4.22–5.81)
RDW: 16.9 % — ABNORMAL HIGH (ref 11.5–15.5)
WBC: 10.8 10*3/uL — ABNORMAL HIGH (ref 4.0–10.5)

## 2014-02-19 LAB — BASIC METABOLIC PANEL
Anion gap: 10 (ref 5–15)
BUN: 46 mg/dL — ABNORMAL HIGH (ref 6–23)
CALCIUM: 8.4 mg/dL (ref 8.4–10.5)
CHLORIDE: 101 meq/L (ref 96–112)
CO2: 26 mmol/L (ref 19–32)
Creatinine, Ser: 5.9 mg/dL — ABNORMAL HIGH (ref 0.50–1.35)
GFR, EST AFRICAN AMERICAN: 12 mL/min — AB (ref >90)
GFR, EST NON AFRICAN AMERICAN: 10 mL/min — AB (ref >90)
Glucose, Bld: 103 mg/dL — ABNORMAL HIGH (ref 70–99)
POTASSIUM: 4.8 mmol/L (ref 3.5–5.1)
Sodium: 137 mmol/L (ref 135–145)

## 2014-02-19 MED ORDER — ONDANSETRON 4 MG PO TBDP
8.0000 mg | ORAL_TABLET | Freq: Three times a day (TID) | ORAL | Status: DC | PRN
  Administered 2014-02-19 – 2014-02-21 (×2): 8 mg via ORAL
  Filled 2014-02-19 (×2): qty 2

## 2014-02-19 NOTE — Progress Notes (Signed)
Lunch time accucheck - 95 Dinner time accucheck - 77

## 2014-02-19 NOTE — Progress Notes (Signed)
Subjective: Interval History: Presently patient does not have any nausea or vomiting. Yesterday after he finished dialysis he has an episode of nausea and vomiting. Presently he denies any difficulty in breathing.  Objective: Vital signs in last 24 hours: Temp:  [97.7 F (36.5 C)-98.5 F (36.9 C)] 97.8 F (36.6 C) (01/17 0559) Pulse Rate:  [56-88] 86 (01/17 0559) Resp:  [14-21] 21 (01/17 0559) BP: (128-175)/(84-108) 128/94 mmHg (01/17 0559) SpO2:  [94 %-100 %] 100 % (01/17 0559) Weight:  [118.616 kg (261 lb 8 oz)-122.7 kg (270 lb 8.1 oz)] 118.616 kg (261 lb 8 oz) (01/17 0559) Weight change:   Intake/Output from previous day: 01/16 0701 - 01/17 0700 In: 120 [P.O.:120] Out: 3184 [Urine:375] Intake/Output this shift:    Generally he is alert and in no apparent distress. Chest is clear to auscultation no rales, or wheezing. His heart exam revealed regular rate and rhythm no murmur   abdomen obese, positive bowel sound Extremities no edema  Lab Results:  Recent Labs  02/18/14 0444 02/19/14 0505  WBC 5.7 10.8*  HGB 11.1* 12.5*  HCT 34.6* 39.8  PLT 91* 93*   BMET:   Recent Labs  02/18/14 0444 02/19/14 0505  NA 139 137  K 5.4* 4.8  CL 108 101  CO2 24 26  GLUCOSE 85 103*  BUN 74* 46*  CREATININE 6.29* 5.90*  CALCIUM 8.2* 8.4   No results for input(s): PTH in the last 72 hours. Iron Studies: No results for input(s): IRON, TIBC, TRANSFERRIN, FERRITIN in the last 72 hours.  Studies/Results: Ir Fluoro Guide Cv Line Right  02/18/2014   INDICATION: 49 year old with end-stage renal disease. Patient needs a tunneled dialysis catheter for hemodialysis. Patient currently has a non tunneled right groin catheter.  EXAM: FLUOROSCOPIC AND ULTRASOUND GUIDED PLACEMENT OF A TUNNELED DIALYSIS CATHETER  Physician: Stephan Minister. Henn, MD  FLUOROSCOPY TIME:  6 seconds  MEDICATIONS: 1 mg versed, 50 mcg fentanyl. A radiology nurse monitored the patient for moderate sedation. As antibiotic  prophylaxis, Ancef was ordered pre-procedure and administered intravenously within one hour of incision.  ANESTHESIA/SEDATION: Moderate sedation time: 28 minutes  PROCEDURE: Informed consent was obtained for placement of a tunneled dialysis catheter. Specifically, the risks of bleeding and infection were discussed with the patient. The patient was placed supine on the interventional table. Ultrasound confirmed a patent right internal jugularvein. Ultrasound images were obtained for documentation. The right side of the neck was prepped and draped in a sterile fashion. The right side of the neck was anesthetized with 1% lidocaine. Maximal barrier sterile technique was utilized including caps, mask, sterile gowns, sterile gloves, sterile drape, hand hygiene and skin antiseptic. A small incision was made with #11 blade scalpel. A 21 gauge needle directed into the right internal jugular vein with ultrasound guidance. A micropuncture dilator set was placed. A 23 cm tip to cuff HemoSplit catheter was selected. The skin below the right clavicle was anesthetized and a small incision was made with an #11 blade scalpel. A subcutaneous tunnel was formed to the vein dermatotomy site. The catheter was brought through the tunnel. The vein dermatotomy site was dilated to accommodate a peel-away sheath. The catheter was placed through the peel-away sheath and directed into the central venous structures. The tip of the catheter was placed in the right atrium with fluoroscopy. Fluoroscopic images were obtained for documentation. Both lumens were found to aspirate and flush well. The proper amount of heparin was flushed in both lumens. The vein dermatotomy site was  closed using a single layer of absorbable suture and Dermabond. The catheter was secured to the skin using Prolene suture. Gel-Foam was placed in subcutaneous tunnel. Bandage placed over the catheter site.  FINDINGS: Catheter tip in the right atrium.  COMPLICATIONS: None   IMPRESSION: Successful placement of a right jugular tunneled dialysis catheter using ultrasound and fluoroscopic guidance.   Electronically Signed   By: Markus Daft M.D.   On: 02/18/2014 13:18   Ir US Guide Vasc Access Right  02/18/2014   INDICATION: 49 year old with end-stage renal disease. Patient needs a tunneled dialysis catheter for hemodialysis. Patient currently has a non tunneled right groin catheter.  EXAM: FLUOROSCOPIC AND ULTRASOUND GUIDED PLACEMENT OF A TUNNELED DIALYSIS CATHETER  Physician: Stephan Minister. Henn, MD  FLUOROSCOPY TIME:  6 seconds  MEDICATIONS: 1 mg versed, 50 mcg fentanyl. A radiology nurse monitored the patient for moderate sedation. As antibiotic prophylaxis, Ancef was ordered pre-procedure and administered intravenously within one hour of incision.  ANESTHESIA/SEDATION: Moderate sedation time: 28 minutes  PROCEDURE: Informed consent was obtained for placement of a tunneled dialysis catheter. Specifically, the risks of bleeding and infection were discussed with the patient. The patient was placed supine on the interventional table. Ultrasound confirmed a patent right internal jugularvein. Ultrasound images were obtained for documentation. The right side of the neck was prepped and draped in a sterile fashion. The right side of the neck was anesthetized with 1% lidocaine. Maximal barrier sterile technique was utilized including caps, mask, sterile gowns, sterile gloves, sterile drape, hand hygiene and skin antiseptic. A small incision was made with #11 blade scalpel. A 21 gauge needle directed into the right internal jugular vein with ultrasound guidance. A micropuncture dilator set was placed. A 23 cm tip to cuff HemoSplit catheter was selected. The skin below the right clavicle was anesthetized and a small incision was made with an #11 blade scalpel. A subcutaneous tunnel was formed to the vein dermatotomy site. The catheter was brought through the tunnel. The vein dermatotomy site was  dilated to accommodate a peel-away sheath. The catheter was placed through the peel-away sheath and directed into the central venous structures. The tip of the catheter was placed in the right atrium with fluoroscopy. Fluoroscopic images were obtained for documentation. Both lumens were found to aspirate and flush well. The proper amount of heparin was flushed in both lumens. The vein dermatotomy site was closed using a single layer of absorbable suture and Dermabond. The catheter was secured to the skin using Prolene suture. Gel-Foam was placed in subcutaneous tunnel. Bandage placed over the catheter site.  FINDINGS: Catheter tip in the right atrium.  COMPLICATIONS: None  IMPRESSION: Successful placement of a right jugular tunneled dialysis catheter using ultrasound and fluoroscopic guidance.   Electronically Signed   By: Markus Daft M.D.   On: 02/18/2014 13:18    I have reviewed the patient's current medications.  Assessment/Plan: Problem #1 end-stage renal disease : Patient presently does not have any uremic sign and symptoms. He was dialyzed yesterday. Her postdialysis abdominal pain, nausea vomiting may be from fluid removal. Problem #2 hyperkalemia: His potassium has corrected. Problem #3 hypertension: His blood pressure is reasonably controlled Problem #4 anemia: His hemoglobin and hematocrit is within our target range. Stable. Problem #5 history of diabetes .  Problem #6 metabolic bone disease: His calcium and phosphorus is range Problem #7 morbid obesity Problem #8 right bundle branch block. Plan: Patient does not require dialysis today We'll check his basic metabolic panel in  the morning. We'll remove his femoral catheter Once an arrangement stent for outpatient dialysis patient will be followed as an outpatient.     LOS: 3 days   Liann Spaeth S 02/19/2014,9:22 AM

## 2014-02-19 NOTE — Progress Notes (Signed)
Patients blood sugar 73 this AM.  Glucometer did not read bracelet, done as an override.

## 2014-02-19 NOTE — Progress Notes (Signed)
[facility] PROGRESS NOTE  Jack Irwin [id number] DOB: [birth year] DOA: 02/16/2014 PCP: [name], MD  Assessment/Plan: Hyperkalemia -Due to CKD Stage V-->ESRD. -Improved after HD. -K is 4.8 today.  ESRD -New HD start. -S/p tunneled HD cath placement 1/16 by IR. -Awaiting CLIP process prior to DC.  HTN -Fair control. -Should continue to improve with HD.  Obesity  Hypoglycemia -No h/o DM. -Likely related to being NPO for procedure. -Check CBGs more frequently.  Code Status: Full Code Family Communication: Patient only  Disposition Plan: DC home once CLIP process complete and OP HD slot secured.   Consultants:  Renal   Antibiotics:  None   Subjective: No complaints.   Objective: Filed Vitals:   02/18/14 1930 02/18/14 1935 02/18/14 2228 02/19/14 0559  BP: 151/100 150/102 131/99 128/94  Pulse: 88 72 79 86  Temp:  98 F (36.7 C) 98.5 F (36.9 C) 97.8 F (36.6 C)  TempSrc:  Oral Oral Oral  Resp:  20 20 21   Height:      Weight:  120.1 kg (264 lb 12.4 oz)  118.616 kg (261 lb 8 oz)  SpO2:  99% 95% 100%    Intake/Output Summary (Last 24 hours) at 02/19/14 1412 Last data filed at 02/19/14 1000  Gross per 24 hour  Intake    360 ml  Output   2909 ml  Net  -2549 ml   Filed Weights   02/18/14 1550 02/18/14 1935 02/19/14 0559  Weight: 122.7 kg (270 lb 8.1 oz) 120.1 kg (264 lb 12.4 oz) 118.616 kg (261 lb 8 oz)    Exam:   General:  AA Ox3  Cardiovascular: RRR  Respiratory: CTA B  Abdomen: obese/S/NT/ND/+BS  Extremities: 1+ edema bilaterally   Neurologic:  Non-focal  Data Reviewed: Basic Metabolic Panel:  Recent Labs Lab 02/16/14 1425 02/16/14 1615 02/16/14 2140 02/17/14 0524 02/18/14 0444 02/19/14 0505  NA 137  --  137 139 139 137  K 8.2* 7.8* 5.3* 5.3* 5.4* 4.8  CL 112  --  109 110 108 101  CO2  --   --  19 23 24 26   GLUCOSE 82  --  83 93 85 103*  BUN 125*  --  91* 87* 74* 46*  CREATININE 6.50*  --   5.89* 6.31* 6.29* 5.90*  CALCIUM  --   --  7.7* 8.0* 8.2* 8.4  PHOS  --   --   --  5.6*  --   --    Liver Function Tests:  Recent Labs Lab 02/17/14 0524  AST 26  ALT 18  ALKPHOS 70  BILITOT 1.1  PROT 6.8  ALBUMIN 3.1*    Recent Labs Lab 02/16/14 1220  LIPASE CANCELLED BY LAB   No results for input(s): AMMONIA in the last 168 hours. CBC:  Recent Labs Lab 02/16/14 1220 02/16/14 1425 02/17/14 0524 02/18/14 0444 02/19/14 0505  WBC 10.3  --  6.7 5.7 10.8*  NEUTROABS 8.4*  --   --   --   --   HGB 12.2* 13.3 10.7* 11.1* 12.5*  HCT 39.0 39.0 33.2* 34.6* 39.8  MCV 87.6  --  87.1 88.5 88.6  PLT 190  --  126* 91* 93*   Cardiac Enzymes: No results for input(s): CKTOTAL, CKMB, CKMBINDEX, TROPONINI in the last 168 hours. BNP (last 3 results) No results for input(s): PROBNP in the last 8760 hours. CBG:  Recent Labs Lab 02/18/14 1109 02/18/14 1115 02/18/14 1127 02/18/14 1132 02/18/14 1142  GLUCAP 44*  65* 51* 42* 75    Recent Results (from the past 240 hour(s))  MRSA PCR Screening     Status: None   Collection Time: 02/16/14  7:30 PM  Result Value Ref Range Status   MRSA by PCR NEGATIVE NEGATIVE Final    Comment:        The GeneXpert MRSA Assay (FDA approved for NASAL specimens only), is one component of a comprehensive MRSA colonization surveillance program. It is not intended to diagnose MRSA infection nor to guide or monitor treatment for MRSA infections.      Studies: Ir Fluoro Guide Cv Line Right  02/18/2014   INDICATION: 49 year old with end-stage renal disease. Patient needs a tunneled dialysis catheter for hemodialysis. Patient currently has a non tunneled right groin catheter.  EXAM: FLUOROSCOPIC AND ULTRASOUND GUIDED PLACEMENT OF A TUNNELED DIALYSIS CATHETER  Physician: Stephan Minister. Henn, MD  FLUOROSCOPY TIME:  6 seconds  MEDICATIONS: 1 mg versed, 50 mcg fentanyl. A radiology nurse monitored the patient for moderate sedation. As antibiotic prophylaxis,  Ancef was ordered pre-procedure and administered intravenously within one hour of incision.  ANESTHESIA/SEDATION: Moderate sedation time: 28 minutes  PROCEDURE: Informed consent was obtained for placement of a tunneled dialysis catheter. Specifically, the risks of bleeding and infection were discussed with the patient. The patient was placed supine on the interventional table. Ultrasound confirmed a patent right internal jugularvein. Ultrasound images were obtained for documentation. The right side of the neck was prepped and draped in a sterile fashion. The right side of the neck was anesthetized with 1% lidocaine. Maximal barrier sterile technique was utilized including caps, mask, sterile gowns, sterile gloves, sterile drape, hand hygiene and skin antiseptic. A small incision was made with #11 blade scalpel. A 21 gauge needle directed into the right internal jugular vein with ultrasound guidance. A micropuncture dilator set was placed. A 23 cm tip to cuff HemoSplit catheter was selected. The skin below the right clavicle was anesthetized and a small incision was made with an #11 blade scalpel. A subcutaneous tunnel was formed to the vein dermatotomy site. The catheter was brought through the tunnel. The vein dermatotomy site was dilated to accommodate a peel-away sheath. The catheter was placed through the peel-away sheath and directed into the central venous structures. The tip of the catheter was placed in the right atrium with fluoroscopy. Fluoroscopic images were obtained for documentation. Both lumens were found to aspirate and flush well. The proper amount of heparin was flushed in both lumens. The vein dermatotomy site was closed using a single layer of absorbable suture and Dermabond. The catheter was secured to the skin using Prolene suture. Gel-Foam was placed in subcutaneous tunnel. Bandage placed over the catheter site.  FINDINGS: Catheter tip in the right atrium.  COMPLICATIONS: None  IMPRESSION:  Successful placement of a right jugular tunneled dialysis catheter using ultrasound and fluoroscopic guidance.   Electronically Signed   By: Markus Daft M.D.   On: 02/18/2014 13:18   Ir US Guide Vasc Access Right  02/18/2014   INDICATION: 49 year old with end-stage renal disease. Patient needs a tunneled dialysis catheter for hemodialysis. Patient currently has a non tunneled right groin catheter.  EXAM: FLUOROSCOPIC AND ULTRASOUND GUIDED PLACEMENT OF A TUNNELED DIALYSIS CATHETER  Physician: Stephan Minister. Henn, MD  FLUOROSCOPY TIME:  6 seconds  MEDICATIONS: 1 mg versed, 50 mcg fentanyl. A radiology nurse monitored the patient for moderate sedation. As antibiotic prophylaxis, Ancef was ordered pre-procedure and administered intravenously within one hour of incision.  ANESTHESIA/SEDATION: Moderate sedation time: 28 minutes  PROCEDURE: Informed consent was obtained for placement of a tunneled dialysis catheter. Specifically, the risks of bleeding and infection were discussed with the patient. The patient was placed supine on the interventional table. Ultrasound confirmed a patent right internal jugularvein. Ultrasound images were obtained for documentation. The right side of the neck was prepped and draped in a sterile fashion. The right side of the neck was anesthetized with 1% lidocaine. Maximal barrier sterile technique was utilized including caps, mask, sterile gowns, sterile gloves, sterile drape, hand hygiene and skin antiseptic. A small incision was made with #11 blade scalpel. A 21 gauge needle directed into the right internal jugular vein with ultrasound guidance. A micropuncture dilator set was placed. A 23 cm tip to cuff HemoSplit catheter was selected. The skin below the right clavicle was anesthetized and a small incision was made with an #11 blade scalpel. A subcutaneous tunnel was formed to the vein dermatotomy site. The catheter was brought through the tunnel. The vein dermatotomy site was dilated to  accommodate a peel-away sheath. The catheter was placed through the peel-away sheath and directed into the central venous structures. The tip of the catheter was placed in the right atrium with fluoroscopy. Fluoroscopic images were obtained for documentation. Both lumens were found to aspirate and flush well. The proper amount of heparin was flushed in both lumens. The vein dermatotomy site was closed using a single layer of absorbable suture and Dermabond. The catheter was secured to the skin using Prolene suture. Gel-Foam was placed in subcutaneous tunnel. Bandage placed over the catheter site.  FINDINGS: Catheter tip in the right atrium.  COMPLICATIONS: None  IMPRESSION: Successful placement of a right jugular tunneled dialysis catheter using ultrasound and fluoroscopic guidance.   Electronically Signed   By: Markus Daft M.D.   On: 02/18/2014 13:18    Scheduled Meds: . calcitRIOL  0.25 mcg Oral Daily  . calcium acetate  667 mg Oral TID WC  . enoxaparin (LOVENOX) injection  30 mg Subcutaneous Q24H  . febuxostat  80 mg Oral Daily  . metoprolol tartrate  37.5 mg Oral BID  . sodium bicarbonate  650 mg Oral TID  . sodium chloride  3 mL Intravenous Q12H   Continuous Infusions: . sodium chloride      Active Problems:   Hyperkalemia   Hypertension   ESRD on dialysis   Morbid obesity    Time spent: 35 minutes. Greater than 50% of this time was spent in direct contact with the patient coordinating care.    Lelon Frohlich  [facility] Pager [phone]  If 7PM-7AM, please contact night-coverage at [email or web address], password [facility] 02/19/2014, 2:12 PM  LOS: 3 days

## 2014-02-19 NOTE — Progress Notes (Signed)
Pt experienced heavy N/V spell shortly after returning from dialysis tx.  Md informed.

## 2014-02-20 LAB — GLUCOSE, CAPILLARY
GLUCOSE-CAPILLARY: 88 mg/dL (ref 70–99)
GLUCOSE-CAPILLARY: 114 mg/dL — AB (ref 70–99)
GLUCOSE-CAPILLARY: 101 mg/dL — AB (ref 70–99)
GLUCOSE-CAPILLARY: 119 mg/dL — AB (ref 70–99)
Glucose-Capillary: 73 mg/dL (ref 70–99)
Glucose-Capillary: 95 mg/dL (ref 70–99)
Glucose-Capillary: 73 mg/dL (ref 70–99)
Glucose-Capillary: 105 mg/dL — ABNORMAL HIGH (ref 70–99)
Glucose-Capillary: 73 mg/dL (ref 70–99)
Glucose-Capillary: 71 mg/dL (ref 70–99)
Glucose-Capillary: 103 mg/dL — ABNORMAL HIGH (ref 70–99)

## 2014-02-20 LAB — BASIC METABOLIC PANEL
ANION GAP: 11 (ref 5–15)
BUN: 65 mg/dL — AB (ref 6–23)
CO2: 25 mmol/L (ref 19–32)
CREATININE: 8.37 mg/dL — AB (ref 0.50–1.35)
Calcium: 8.4 mg/dL (ref 8.4–10.5)
Chloride: 98 mEq/L (ref 96–112)
GFR calc Af Amer: 8 mL/min — ABNORMAL LOW (ref >90)
GFR calc non Af Amer: 7 mL/min — ABNORMAL LOW (ref >90)
Glucose, Bld: 76 mg/dL (ref 70–99)
Potassium: 5 mmol/L (ref 3.5–5.1)
Sodium: 134 mmol/L — ABNORMAL LOW (ref 135–145)

## 2014-02-20 MED ORDER — CHLORHEXIDINE GLUCONATE CLOTH 2 % EX PADS: 6.0000 | MEDICATED_PAD | Freq: Once | CUTANEOUS | Status: DC

## 2014-02-20 MED ORDER — SODIUM CHLORIDE 0.9 % IV SOLN: INTRAVENOUS | Status: DC

## 2014-02-20 MED ORDER — DEXTROSE 5 % IV SOLN: 1.5000 g | INTRAVENOUS | Status: DC

## 2014-02-20 NOTE — Clinical Social Work Note (Addendum)
CSW sent additional information to [facility] early this morning. Spoke with Angela Nevin and [employer]. They report pt will not be able to be set up for outpatient dialysis tomorrow due to paperwork and orders needed. MD notified.   Benay Pike, [place]

## 2014-02-20 NOTE — Clinical Documentation Improvement (Signed)
Anemia documented in current record; patient has CKD which has progressed to ESRD requiring dialysis.  Please provide greater specificity of patient's anemia, if able, and document in your progress note and carry over to the discharge summary.    Possible Clinical Conditions: -Anemia in chronic kidney disease -Other anemia (please specify) -Unable to determine at present  Also - patient's platelets have dropped from 190 02/16/2014 to 93 on 02/19/2014.  Please identify any clinical conditions associated with the decreased platelets, if any, and also document in your progress note and discharge summary.  Possible Clinical Conditions: -Thrombocytopenia -Other condition (please specify) -Unable to determine  Thank you, Mateo Flow, RN [phone] Clinical Documentation Specialist

## 2014-02-20 NOTE — Progress Notes (Signed)
[facility] PROGRESS NOTE  Jack Irwin [id number] DOB: 04/05/1965 DOA: 02/16/2014 PCP: [name], MD  Assessment/Plan: Hyperkalemia -Due to CKD Stage V-->ESRD. -Improved after HD. -K is 5.0 today.  ESRD -New HD start. -S/p tunneled HD cath placement 1/16 by IR. -Awaiting CLIP process prior to DC. -For HD in am.  HTN -Fair control. -Should continue to improve with HD.  Obesity  Hypoglycemia -Resolved. -No h/o DM. -Likely related to being NPO for procedure.   Code Status: Full Code Family Communication: Patient only  Disposition Plan: DC home once CLIP process complete and OP HD slot secured.   Consultants:  Renal   Antibiotics:  None   Subjective: No complaints.   Objective: Filed Vitals:   02/19/14 1855 02/19/14 2147 02/20/14 0653 02/20/14 1307  BP: 117/92 115/81 97/68 107/73  Pulse: 81 88 65 66  Temp: 97.6 F (36.4 C) 98.4 F (36.9 C) 98.2 F (36.8 C) 98.5 F (36.9 C)  TempSrc:  Oral Oral Oral  Resp: 20 20 20 20   Height:      Weight:   123.832 kg (273 lb)   SpO2: 100% 96% 98% 100%    Intake/Output Summary (Last 24 hours) at 02/20/14 1650 Last data filed at 02/20/14 1308  Gross per 24 hour  Intake    720 ml  Output    100 ml  Net    620 ml   Filed Weights   02/18/14 1935 02/19/14 0559 02/20/14 0653  Weight: 120.1 kg (264 lb 12.4 oz) 118.616 kg (261 lb 8 oz) 123.832 kg (273 lb)    Exam:   General:  AA Ox3  Cardiovascular: RRR  Respiratory: CTA B  Abdomen: obese/S/NT/ND/+BS  Extremities: 1+ edema bilaterally   Neurologic:  Non-focal  Data Reviewed: Basic Metabolic Panel:  Recent Labs Lab 02/16/14 2140 02/17/14 0524 02/18/14 0444 02/19/14 0505 02/20/14 0514  NA 137 139 139 137 134*  K 5.3* 5.3* 5.4* 4.8 5.0  CL 109 110 108 101 98  CO2 19 23 24 26 25   GLUCOSE 83 93 85 103* 76  BUN 91* 87* 74* 46* 65*  CREATININE 5.89* 6.31* 6.29* 5.90* 8.37*  CALCIUM 7.7* 8.0* 8.2* 8.4 8.4  PHOS  --   5.6*  --   --   --    Liver Function Tests:  Recent Labs Lab 02/17/14 0524  AST 26  ALT 18  ALKPHOS 70  BILITOT 1.1  PROT 6.8  ALBUMIN 3.1*    Recent Labs Lab 02/16/14 1220  LIPASE CANCELLED BY LAB   No results for input(s): AMMONIA in the last 168 hours. CBC:  Recent Labs Lab 02/16/14 1220 02/16/14 1425 02/17/14 0524 02/18/14 0444 02/19/14 0505  WBC 10.3  --  6.7 5.7 10.8*  NEUTROABS 8.4*  --   --   --   --   HGB 12.2* 13.3 10.7* 11.1* 12.5*  HCT 39.0 39.0 33.2* 34.6* 39.8  MCV 87.6  --  87.1 88.5 88.6  PLT 190  --  126* 91* 93*   Cardiac Enzymes: No results for input(s): CKTOTAL, CKMB, CKMBINDEX, TROPONINI in the last 168 hours. BNP (last 3 results) No results for input(s): PROBNP in the last 8760 hours. CBG:  Recent Labs Lab 02/19/14 1156 02/19/14 1709 02/19/14 2131 02/20/14 0757 02/20/14 1127  GLUCAP 95 73 105* 73 119*    Recent Results (from the past 240 hour(s))  MRSA PCR Screening     Status: None   Collection Time: 02/16/14  7:30 PM  Result Value Ref Range Status   MRSA by PCR NEGATIVE NEGATIVE Final    Comment:        The GeneXpert MRSA Assay (FDA approved for NASAL specimens only), is one component of a comprehensive MRSA colonization surveillance program. It is not intended to diagnose MRSA infection nor to guide or monitor treatment for MRSA infections.      Studies: No results found.  Scheduled Meds: . calcitRIOL  0.25 mcg Oral Daily  . calcium acetate  667 mg Oral TID WC  . febuxostat  80 mg Oral Daily  . metoprolol tartrate  37.5 mg Oral BID  . sodium chloride  3 mL Intravenous Q12H   Continuous Infusions: . sodium chloride      Active Problems:   Hyperkalemia   Hypertension   ESRD on dialysis   Morbid obesity    Time spent: 25 minutes. Greater than 50% of this time was spent in direct contact with the patient coordinating care.    Lelon Frohlich  [facility] Pager [phone]  If 7PM-7AM,  please contact night-coverage at [email or web address], password [facility] 02/20/2014, 4:50 PM  LOS: 4 days

## 2014-02-20 NOTE — Care Management Note (Addendum)
    Page 1 of 1   02/22/2014     10:49:43 AM CARE MANAGEMENT NOTE 02/22/2014  Patient:  MCCLELLAN, POKORSKI   Account Number:  [id number]  Date Initiated:  02/17/2014  Documentation initiated by:  Vladimir Creeks  Subjective/Objective Assessment:   Admitted with [facility], and is started on HD here at [facility]. Pt is from home with spouse and will be returning home at D/C. CSW is setting up the OP HD for the pt, to start next week     Action/Plan:   Referred to CSW   Anticipated DC Date:  02/20/2014   Anticipated DC Plan:  HOME/SELF CARE  In-house referral  Clinical Social Worker      DC Planning Services  CM consult      Choice offered to / List presented to:             Status of service:  Completed, signed off Medicare Important Message given?  YES (If response is "NO", the following Medicare IM given date fields will be blank) Date Medicare IM given:  02/17/2014 Medicare IM given by:  Vladimir Creeks Date Additional Medicare IM given:  02/22/2014 Additional Medicare IM given by:  Theophilus Kinds  Discharge Disposition:  HOME/SELF CARE  Per UR Regulation:  Reviewed for med. necessity/level of care/duration of stay  If discussed at [place] of Stay Meetings, dates discussed:   02/21/2014    Comments:  02/22/14 [place], RN BSN CM Pt to be discharged home today. CSW has arranged pts outpt dialysis. No other CM needs noted.  02/20/14 [place], RN BSN CM Anticipate discharge within 24 hours once dialysis chair appt has been arranged by CSW. No CM needs noted.  02/17/14 1500 Vladimir Creeks RN/CM

## 2014-02-20 NOTE — Progress Notes (Signed)
Subjective: Interval History: Presently patient offers no complaints. He denies any difficulty breathing. His appetite is improving. Objective: Vital signs in last 24 hours: Temp:  [97.6 F (36.4 C)-98.4 F (36.9 C)] 98.2 F (36.8 C) (01/18 0653) Pulse Rate:  [65-88] 65 (01/18 0653) Resp:  [20] 20 (01/18 0653) BP: (97-117)/(68-92) 97/68 mmHg (01/18 0653) SpO2:  [96 %-100 %] 98 % (01/18 0653) Weight:  [123.832 kg (273 lb)] 123.832 kg (273 lb) (01/18 0653) Weight change: 1.132 kg (2 lb 7.9 oz)  Intake/Output from previous day: 01/17 0701 - 01/18 0700 In: 480 [P.O.:480] Out: 100 [Urine:100] Intake/Output this shift:    Generally he is alert and in no apparent distress. Chest is clear to auscultation no rales, or wheezing. His heart exam revealed regular rate and rhythm no murmur   abdomen obese, positive bowel sound Extremities no edema  Lab Results:  Recent Labs  02/18/14 0444 02/19/14 0505  WBC 5.7 10.8*  HGB 11.1* 12.5*  HCT 34.6* 39.8  PLT 91* 93*   BMET:   Recent Labs  02/19/14 0505 02/20/14 0514  NA 137 134*  K 4.8 5.0  CL 101 98  CO2 26 25  GLUCOSE 103* 76  BUN 46* 65*  CREATININE 5.90* 8.37*  CALCIUM 8.4 8.4   No results for input(s): PTH in the last 72 hours. Iron Studies: No results for input(s): IRON, TIBC, TRANSFERRIN, FERRITIN in the last 72 hours.  Studies/Results: Ir Fluoro Guide Cv Line Right  02/18/2014   INDICATION: 49 year old with end-stage renal disease. Patient needs a tunneled dialysis catheter for hemodialysis. Patient currently has a non tunneled right groin catheter.  EXAM: FLUOROSCOPIC AND ULTRASOUND GUIDED PLACEMENT OF A TUNNELED DIALYSIS CATHETER  Physician: Stephan Minister. Henn, MD  FLUOROSCOPY TIME:  6 seconds  MEDICATIONS: 1 mg versed, 50 mcg fentanyl. A radiology nurse monitored the patient for moderate sedation. As antibiotic prophylaxis, Ancef was ordered pre-procedure and administered intravenously within one hour of incision.   ANESTHESIA/SEDATION: Moderate sedation time: 28 minutes  PROCEDURE: Informed consent was obtained for placement of a tunneled dialysis catheter. Specifically, the risks of bleeding and infection were discussed with the patient. The patient was placed supine on the interventional table. Ultrasound confirmed a patent right internal jugularvein. Ultrasound images were obtained for documentation. The right side of the neck was prepped and draped in a sterile fashion. The right side of the neck was anesthetized with 1% lidocaine. Maximal barrier sterile technique was utilized including caps, mask, sterile gowns, sterile gloves, sterile drape, hand hygiene and skin antiseptic. A small incision was made with #11 blade scalpel. A 21 gauge needle directed into the right internal jugular vein with ultrasound guidance. A micropuncture dilator set was placed. A 23 cm tip to cuff HemoSplit catheter was selected. The skin below the right clavicle was anesthetized and a small incision was made with an #11 blade scalpel. A subcutaneous tunnel was formed to the vein dermatotomy site. The catheter was brought through the tunnel. The vein dermatotomy site was dilated to accommodate a peel-away sheath. The catheter was placed through the peel-away sheath and directed into the central venous structures. The tip of the catheter was placed in the right atrium with fluoroscopy. Fluoroscopic images were obtained for documentation. Both lumens were found to aspirate and flush well. The proper amount of heparin was flushed in both lumens. The vein dermatotomy site was closed using a single layer of absorbable suture and Dermabond. The catheter was secured to the skin using Prolene suture.  Gel-Foam was placed in subcutaneous tunnel. Bandage placed over the catheter site.  FINDINGS: Catheter tip in the right atrium.  COMPLICATIONS: None  IMPRESSION: Successful placement of a right jugular tunneled dialysis catheter using ultrasound and  fluoroscopic guidance.   Electronically Signed   By: Markus Daft M.D.   On: 02/18/2014 13:18   Ir US Guide Vasc Access Right  02/18/2014   INDICATION: 49 year old with end-stage renal disease. Patient needs a tunneled dialysis catheter for hemodialysis. Patient currently has a non tunneled right groin catheter.  EXAM: FLUOROSCOPIC AND ULTRASOUND GUIDED PLACEMENT OF A TUNNELED DIALYSIS CATHETER  Physician: Stephan Minister. Henn, MD  FLUOROSCOPY TIME:  6 seconds  MEDICATIONS: 1 mg versed, 50 mcg fentanyl. A radiology nurse monitored the patient for moderate sedation. As antibiotic prophylaxis, Ancef was ordered pre-procedure and administered intravenously within one hour of incision.  ANESTHESIA/SEDATION: Moderate sedation time: 28 minutes  PROCEDURE: Informed consent was obtained for placement of a tunneled dialysis catheter. Specifically, the risks of bleeding and infection were discussed with the patient. The patient was placed supine on the interventional table. Ultrasound confirmed a patent right internal jugularvein. Ultrasound images were obtained for documentation. The right side of the neck was prepped and draped in a sterile fashion. The right side of the neck was anesthetized with 1% lidocaine. Maximal barrier sterile technique was utilized including caps, mask, sterile gowns, sterile gloves, sterile drape, hand hygiene and skin antiseptic. A small incision was made with #11 blade scalpel. A 21 gauge needle directed into the right internal jugular vein with ultrasound guidance. A micropuncture dilator set was placed. A 23 cm tip to cuff HemoSplit catheter was selected. The skin below the right clavicle was anesthetized and a small incision was made with an #11 blade scalpel. A subcutaneous tunnel was formed to the vein dermatotomy site. The catheter was brought through the tunnel. The vein dermatotomy site was dilated to accommodate a peel-away sheath. The catheter was placed through the peel-away sheath and  directed into the central venous structures. The tip of the catheter was placed in the right atrium with fluoroscopy. Fluoroscopic images were obtained for documentation. Both lumens were found to aspirate and flush well. The proper amount of heparin was flushed in both lumens. The vein dermatotomy site was closed using a single layer of absorbable suture and Dermabond. The catheter was secured to the skin using Prolene suture. Gel-Foam was placed in subcutaneous tunnel. Bandage placed over the catheter site.  FINDINGS: Catheter tip in the right atrium.  COMPLICATIONS: None  IMPRESSION: Successful placement of a right jugular tunneled dialysis catheter using ultrasound and fluoroscopic guidance.   Electronically Signed   By: Markus Daft M.D.   On: 02/18/2014 13:18    I have reviewed the patient's current medications.  Assessment/Plan: Problem #1 end-stage renal disease : Status post hemodialysis on Saturday. Presently he doesn't have any uremic sinus symptoms. Problem #2 hyperkalemia: His potassium has corrected. Problem #3 hypertension: His blood pressure is reasonably controlled Problem #4 anemia: His hemoglobin and hematocrit is within our target range. Stable. Problem #5 history of diabetes .  Problem #6 metabolic bone disease: His calcium and phosphorus is range Problem #7 morbid obesity Problem #8 right bundle branch block. Problem #9 metabolic acidosis: Has corrected. Plan: Patient does not require dialysis today We'll DC sodium bicarbonate We'll make arrangements for patient to get dialysis tomorrow. Once outpatient arrangement is made patient will be discharged home and to be followed as an outpatient. We'll check his  basic metabolic panel in the morning. We'll remove his femoral catheter Once an arrangement stent for outpatient dialysis patient will be followed as an outpatient.     LOS: 4 days   Josefina Rynders S 02/20/2014,7:42 AM

## 2014-02-21 ENCOUNTER — Ambulatory Visit (HOSPITAL_COMMUNITY): Admission: RE | Admit: 2014-02-21 | Payer: Medicare Other | Source: Ambulatory Visit | Admitting: Vascular Surgery

## 2014-02-21 ENCOUNTER — Encounter (HOSPITAL_COMMUNITY): Admission: RE | Payer: Self-pay | Source: Ambulatory Visit

## 2014-02-21 ENCOUNTER — Ambulatory Visit: Payer: Medicare Other | Admitting: Podiatry

## 2014-02-21 DIAGNOSIS — D696 Thrombocytopenia, unspecified: Secondary | ICD-10-CM

## 2014-02-21 LAB — GLUCOSE, CAPILLARY
GLUCOSE-CAPILLARY: 66 mg/dL — AB (ref 70–99)
Glucose-Capillary: 112 mg/dL — ABNORMAL HIGH (ref 70–99)
Glucose-Capillary: 47 mg/dL — ABNORMAL LOW (ref 70–99)
Glucose-Capillary: 93 mg/dL (ref 70–99)

## 2014-02-21 LAB — BASIC METABOLIC PANEL
Anion gap: 13 (ref 5–15)
BUN: 87 mg/dL — ABNORMAL HIGH (ref 6–23)
CALCIUM: 8.8 mg/dL (ref 8.4–10.5)
CHLORIDE: 98 meq/L (ref 96–112)
CO2: 25 mmol/L (ref 19–32)
Creatinine, Ser: 9.93 mg/dL — ABNORMAL HIGH (ref 0.50–1.35)
GFR calc Af Amer: 6 mL/min — ABNORMAL LOW (ref >90)
GFR calc non Af Amer: 6 mL/min — ABNORMAL LOW (ref >90)
Glucose, Bld: 81 mg/dL (ref 70–99)
Potassium: 5.9 mmol/L — ABNORMAL HIGH (ref 3.5–5.1)
Sodium: 136 mmol/L (ref 135–145)

## 2014-02-21 LAB — CBC
HEMATOCRIT: 42.6 % (ref 39.0–52.0)
Hemoglobin: 13.1 g/dL (ref 13.0–17.0)
MCH: 27 pg (ref 26.0–34.0)
MCHC: 30.8 g/dL (ref 30.0–36.0)
MCV: 87.7 fL (ref 78.0–100.0)
PLATELETS: 122 10*3/uL — AB (ref 150–400)
RBC: 4.86 MIL/uL (ref 4.22–5.81)
RDW: 16.5 % — ABNORMAL HIGH (ref 11.5–15.5)
WBC: 10.3 10*3/uL (ref 4.0–10.5)

## 2014-02-21 LAB — PHOSPHORUS: Phosphorus: 7.3 mg/dL — ABNORMAL HIGH (ref 2.3–4.6)

## 2014-02-21 SURGERY — TRANSPOSITION, VEIN, BASILIC
Anesthesia: Monitor Anesthesia Care | Laterality: Left

## 2014-02-21 MED ORDER — HEPARIN SODIUM (PORCINE) 1000 UNIT/ML IJ SOLN
INTRAMUSCULAR | Status: AC
Start: 2014-02-21 — End: 2014-02-21
  Administered 2014-02-21: 12400 [IU] via INTRAVENOUS_CENTRAL
  Filled 2014-02-21: qty 7

## 2014-02-21 MED ORDER — POLYETHYLENE GLYCOL 3350 17 G PO PACK
17.0000 g | PACK | Freq: Every day | ORAL | Status: DC
  Administered 2014-02-21 – 2014-02-22 (×2): 17 g via ORAL
  Filled 2014-02-21 (×2): qty 1

## 2014-02-21 MED ORDER — SODIUM CHLORIDE 0.9 % IV SOLN: 100.0000 mL | INTRAVENOUS | Status: DC | PRN

## 2014-02-21 MED ORDER — DEXTROSE 50 % IV SOLN
INTRAVENOUS | Status: AC
  Administered 2014-02-21: 50 mL
  Filled 2014-02-21: qty 50

## 2014-02-21 MED ORDER — HEPARIN SODIUM (PORCINE) 1000 UNIT/ML IJ SOLN
INTRAMUSCULAR | Status: AC
  Filled 2014-02-21: qty 10

## 2014-02-21 MED ORDER — SODIUM CHLORIDE 0.9 % IJ SOLN
INTRAMUSCULAR | Status: AC
  Administered 2014-02-21: 10 mL via INTRAVENOUS
  Filled 2014-02-21: qty 18

## 2014-02-21 MED ORDER — ALTEPLASE 2 MG IJ SOLR
2.0000 mg | Freq: Once | INTRAMUSCULAR | Status: AC | PRN
  Filled 2014-02-21: qty 2

## 2014-02-21 MED ORDER — HEPARIN SODIUM (PORCINE) 1000 UNIT/ML DIALYSIS
100.0000 [IU]/kg | INTRAMUSCULAR | Status: DC | PRN
  Administered 2014-02-21: 12400 [IU] via INTRAVENOUS_CENTRAL
  Filled 2014-02-21 (×2): qty 13

## 2014-02-21 NOTE — Progress Notes (Addendum)
[facility] PROGRESS NOTE  Jack Irwin [id number] DOB: 1965-03-19 DOA: 02/16/2014 PCP: [name], MD  Assessment/Plan: Hyperkalemia -Due to CKD Stage V-->ESRD. -K is 5.9 today; should improve with HD.  ESRD -New HD start. -S/p tunneled HD cath placement 1/16 by IR. -OP HD slot secured. -Femoral dialysis cath to be removed in am and then can DC home.  HTN -Fair control. -Should continue to improve with HD.  Obesity  Hypoglycemia -Has this after HD typically. -No h/o DM. -Treat with D50.  Thrombocytopenia -122 today. -Likely heparin effect. -Monitor for now.   Code Status: Full Code Family Communication: Patient only  Disposition Plan: DC home likely in am.   Consultants:  Renal   Antibiotics:  None   Subjective: No complaints.   Objective: Filed Vitals:   02/21/14 1330 02/21/14 1400 02/21/14 1430 02/21/14 1500  BP: 111/73 123/76 119/71 119/84  Pulse: 85 76 80 88  Temp:      TempSrc:      Resp:      Height:      Weight:      SpO2:        Intake/Output Summary (Last 24 hours) at 02/21/14 1615 Last data filed at 02/21/14 1612  Gross per 24 hour  Intake    600 ml  Output    725 ml  Net   -125 ml   Filed Weights   02/18/14 1935 02/19/14 0559 02/20/14 0653  Weight: 120.1 kg (264 lb 12.4 oz) 118.616 kg (261 lb 8 oz) 123.832 kg (273 lb)    Exam:   General:  AA Ox3  Cardiovascular: RRR  Respiratory: CTA B  Abdomen: obese/S/NT/ND/+BS  Extremities: 1+ edema bilaterally   Neurologic:  Non-focal  Data Reviewed: Basic Metabolic Panel:  Recent Labs Lab 02/17/14 0524 02/18/14 0444 02/19/14 0505 02/20/14 0514 02/21/14 0726  NA 139 139 137 134* 136  K 5.3* 5.4* 4.8 5.0 5.9*  CL 110 108 101 98 98  CO2 23 24 26 25 25   GLUCOSE 93 85 103* 76 81  BUN 87* 74* 46* 65* 87*  CREATININE 6.31* 6.29* 5.90* 8.37* 9.93*  CALCIUM 8.0* 8.2* 8.4 8.4 8.8  PHOS 5.6*  --   --   --  7.3*   Liver Function  Tests:  Recent Labs Lab 02/17/14 0524  AST 26  ALT 18  ALKPHOS 70  BILITOT 1.1  PROT 6.8  ALBUMIN 3.1*    Recent Labs Lab 02/16/14 1220  LIPASE CANCELLED BY LAB   No results for input(s): AMMONIA in the last 168 hours. CBC:  Recent Labs Lab 02/16/14 1220 02/16/14 1425 02/17/14 0524 02/18/14 0444 02/19/14 0505 02/21/14 0726  WBC 10.3  --  6.7 5.7 10.8* 10.3  NEUTROABS 8.4*  --   --   --   --   --   HGB 12.2* 13.3 10.7* 11.1* 12.5* 13.1  HCT 39.0 39.0 33.2* 34.6* 39.8 42.6  MCV 87.6  --  87.1 88.5 88.6 87.7  PLT 190  --  126* 91* 93* 122*   Cardiac Enzymes: No results for input(s): CKTOTAL, CKMB, CKMBINDEX, TROPONINI in the last 168 hours. BNP (last 3 results) No results for input(s): PROBNP in the last 8760 hours. CBG:  Recent Labs Lab 02/20/14 0757 02/20/14 1127 02/20/14 1641 02/20/14 2030 02/21/14 0729  GLUCAP 73 119* 71 103* 66*    Recent Results (from the past 240 hour(s))  MRSA PCR Screening     Status: None   Collection Time:  02/16/14  7:30 PM  Result Value Ref Range Status   MRSA by PCR NEGATIVE NEGATIVE Final    Comment:        The GeneXpert MRSA Assay (FDA approved for NASAL specimens only), is one component of a comprehensive MRSA colonization surveillance program. It is not intended to diagnose MRSA infection nor to guide or monitor treatment for MRSA infections.      Studies: No results found.  Scheduled Meds: . calcitRIOL  0.25 mcg Oral Daily  . calcium acetate  667 mg Oral TID WC  . febuxostat  80 mg Oral Daily  . heparin      . metoprolol tartrate  37.5 mg Oral BID  . polyethylene glycol  17 g Oral Daily  . sodium chloride  3 mL Intravenous Q12H   Continuous Infusions: . sodium chloride      Active Problems:   Hyperkalemia   Hypertension   ESRD on dialysis   Morbid obesity   Thrombocytopenia    Time spent: 25 minutes. Greater than 50% of this time was spent in direct contact with the patient coordinating  care.    Lelon Frohlich  [facility] Pager [phone]  If 7PM-7AM, please contact night-coverage at [email or web address], password [facility] 02/21/2014, 4:15 PM  LOS: 5 days

## 2014-02-21 NOTE — Clinical Social Work Note (Signed)
CSW notified pt that outpatient dialysis has been arranged at [facility]. He will complete paperwork Thursday morning at 10:30 with dialysis to follow. Information on AVS as well. Anticipate d/c tomorrow. CSW will sign off.  Benay Pike, [place]

## 2014-02-21 NOTE — Progress Notes (Signed)
BARTT HA  MRN: [id number]  DOB/AGE: 1965/04/22 49 y.o.  Primary Care Physician:ROBERSON, Carmell Austria, MD  Admit date: 02/16/2014  Chief Complaint:  Chief Complaint  Patient presents with  . Abdominal Pain    S-Pt presented on  02/16/2014 with  Chief Complaint  Patient presents with  . Abdominal Pain  .    Pt today feels better  Meds . calcitRIOL  0.25 mcg Oral Daily  . calcium acetate  667 mg Oral TID WC  . febuxostat  80 mg Oral Daily  . metoprolol tartrate  37.5 mg Oral BID  . sodium chloride  3 mL Intravenous Q12H      Physical Exam: Vital signs in last 24 hours: Temp:  [98 F (36.7 C)-98.5 F (36.9 C)] 98.5 F (36.9 C) (01/19 0541) Pulse Rate:  [66-68] 68 (01/19 0541) Resp:  [20] 20 (01/19 0541) BP: (107-129)/(73-77) 129/77 mmHg (01/19 0541) SpO2:  [98 %-100 %] 99 % (01/19 0541) Weight change:  Last BM Date: 02/18/14  Intake/Output from previous day: 01/18 0701 - 01/19 0700 In: 77 [P.O.:960] Out: 550 [Urine:550] Total I/O In: 120 [P.O.:120] Out: -    Physical Exam: General- pt is awake,alert, oriented to time place and person Resp- No acute REsp distress, CTA B/L NO Rhonchi CVS- S1S2 regular ij rate and rhythm GIT- BS+, soft, NT, ND EXT- NO LE Edema, Cyanosis Access- PC in situ               Femoral in situ as well.  Lab Results: CBC  Recent Labs  02/19/14 0505 02/21/14 0726  WBC 10.8* 10.3  HGB 12.5* 13.1  HCT 39.8 42.6  PLT 93* 122*    BMET  Recent Labs  02/20/14 0514 02/21/14 0726  NA 134* 136  K 5.0 5.9*  CL 98 98  CO2 25 25  GLUCOSE 76 81  BUN 65* 87*  CREATININE 8.37* 9.93*  CALCIUM 8.4 8.8    MICRO Recent Results (from the past 240 hour(s))  MRSA PCR Screening     Status: None   Collection Time: 02/16/14  7:30 PM  Result Value Ref Range Status   MRSA by PCR NEGATIVE NEGATIVE Final    Comment:        The GeneXpert MRSA Assay (FDA approved for NASAL specimens only), is one component of a comprehensive  MRSA colonization surveillance program. It is not intended to diagnose MRSA infection nor to guide or monitor treatment for MRSA infections.       Lab Results  Component Value Date   PTH <2.5 Result repeated and verified.* 09/12/2009   CALCIUM 8.8 02/21/2014   CAION 1.00* 02/16/2014   PHOS 7.3* 02/21/2014         Impression: 1)Renal CKD stage 5=> Now progressed to ESRD               Initiated on HD since 02/16/14 .               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   5)DM-Primary MD following  6)Electrolytes Hyperkalemic   Sec to ESRD Hyponatremic    Now better    Sec to ESRD  7)Acid base Co2 at goal     Plan:  Will dialyze today  Will use 2k bath, this should help with high k Will d/c femoral cath       BHUTANI,MANPREET S 02/21/2014, 9:41 AM

## 2014-02-21 NOTE — Procedures (Signed)
   HEMODIALYSIS TREATMENT NOTE:  4.5 hour heparinized HD completed via right IJ tunneled catheter.  Exit site unremarkable.  Both catheter ports aspirated and flushed without resistance, however catheter unable to achieve Qb>300 without reversing lines.  Qb decreased to 350 as tx progressed d/t excessively negative arterial pressures.  Hemodynamically stable; goal met.  1.9 liters removed without interruption in ultrafiltration.  Pt had expressed anxiety about "too much fluid being pulled off and making me sick," referring to episode of N/V after last HD session.  Repeated explanations about how increasing treatment time decreases ultrafiltration rate were given.  Pt still expressing concerns about "being on the machine that long [4.5 hours]."  Orthostatic BPs taken after all blood was reinfused:    Lying:    133/80 - 84 Sitting:  107/75 - 94 Standing: 102/56 - 102  Differences between inpatient and incenter dialysis procedures were discussed at length.  Report called to Lonzo Cloud, RN.  Sera Hitsman L. Dorn Hartshorne, RN, CDN

## 2014-02-22 LAB — GLUCOSE, CAPILLARY
GLUCOSE-CAPILLARY: 74 mg/dL (ref 70–99)
Glucose-Capillary: 98 mg/dL (ref 70–99)

## 2014-02-22 NOTE — Discharge Summary (Signed)
Physician Discharge Summary  Jack Irwin [id number] DOB: Mar 11, 1965 DOA: 02/16/2014  PCP: [name], MD  Admit date: 02/16/2014 Discharge date: 02/22/2014  Time spent: 30 minutes  Recommendations for Outpatient Follow-up:  1. Follow up with HD as scheduled 2. Follow up with PCP in 1-2 weeks  Discharge Diagnoses:  Active Problems:   Hyperkalemia   Hypertension   ESRD on dialysis   Morbid obesity   Thrombocytopenia  Discharge Condition: Improved  Diet recommendation: Renal/Carb modified  Filed Weights   02/19/14 0559 02/20/14 0653  Weight: 118.616 kg (261 lb 8 oz) 123.832 kg (273 lb)    History of present illness:  Please review h and p from 1/14 for details. Briefly, pt presents with nausea/vomiting and diarrhea. Patient was found to be hyperkalemic with K of 8.2. Nephrology was consulted and hospitalist service was consulted for admission.  Hospital Course:  Hyperkalemia -Due to CKD Stage V-->ESRD. -K to be corrected on HD.  ESRD -New HD start. -S/p tunneled HD cath placement 1/16 by IR. -OP HD slot secured. -Femoral dialysis cath removed 1/20 with plans for discharge  HTN -Fair control. -Should continue to improve with HD.  Obesity  Hypoglycemia - No h/o DM. -Was treated with D50.  Thrombocytopenia -Platelets remained stable. -Likely heparin effect.  Consultations:  Nephrology  General Surgery  Discharge Exam: Filed Vitals:   02/21/14 1535 02/21/14 2136 02/22/14 0632 02/22/14 1024  BP: 133/80 129/90 111/77 133/84  Pulse: 84 96 79 85  Temp: 98 F (36.7 C) 97.6 F (36.4 C) 98.5 F (36.9 C) 98 F (36.7 C)  TempSrc: Oral Oral Oral Oral  Resp: 18 17 20 20   Height:      Weight:      SpO2: 98% 97% 100% 98%   General: Awake, in nad Cardiovascular: regular, s1, s2 Respiratory: normal resp effort, no wheezing  Discharge Instructions     Medication List    STOP taking these medications        furosemide 40 MG tablet   Commonly known as:  LASIX     predniSONE 50 MG tablet  Commonly known as:  DELTASONE      TAKE these medications        calcitRIOL 0.25 MCG capsule  Commonly known as:  ROCALTROL  Take 1 capsule by mouth daily.     calcium acetate 667 MG capsule  Commonly known as:  PHOSLO  Take 667 mg by mouth 3 (three) times daily with meals.     COD LIVER OIL PO  Take 1 capsule by mouth daily.     colchicine 0.6 MG tablet  Commonly known as:  COLCRYS  Take 1 tablet (0.6 mg total) by mouth daily.     febuxostat 40 MG tablet  Commonly known as:  ULORIC  Take 3 tablets (120 mg total) by mouth daily.     glucosamine-chondroitin 500-400 MG tablet  Take 1 tablet by mouth daily as needed (bones).     magic mouthwash w/lidocaine Soln  - Gargle and spit 10 mL every 4 hours prn throat pain.    -   - Pharmacy - mix equal parts     metoprolol tartrate 25 MG tablet  Commonly known as:  LOPRESSOR  Take 37.5 mg by mouth 2 (two) times daily.     ondansetron 4 MG disintegrating tablet  Commonly known as:  ZOFRAN ODT  4mg  ODT q4 hours prn nausea/vomit     oxyCODONE-acetaminophen 5-325 MG per tablet  Commonly known as:  PERCOCET  Take 1-2 tablets by mouth every 4 (four) hours as needed.     sildenafil 100 MG tablet  Commonly known as:  VIAGRA  Take 100 mg by mouth daily as needed for erectile dysfunction.     sodium bicarbonate 650 MG tablet  Take 650 mg by mouth 3 (three) times daily.     traMADol 50 MG tablet  Commonly known as:  ULTRAM  Take 1 tablet (50 mg total) by mouth every 6 (six) hours as needed.     Vitamin D3 1000 UNITS Caps  Take 1 capsule by mouth daily.       No Known Allergies Follow-up Information    Follow up with Theressa Stamps On 02/23/2014.   Why:  Thursday 1/21 dialysis at 10:30   Contact information:   [id number]      Follow up with [name], MD. Go in 1 week.   Specialty:  Nurse Practitioner   Contact information:   [address] [place] [address] [phone]        The results of significant diagnostics from this hospitalization (including imaging, microbiology, ancillary and laboratory) are listed below for reference.    Significant Diagnostic Studies: Ir Fluoro Guide Cv Line Right  02/18/2014   INDICATION: 49 year old with end-stage renal disease. Patient needs a tunneled dialysis catheter for hemodialysis. Patient currently has a non tunneled right groin catheter.  EXAM: FLUOROSCOPIC AND ULTRASOUND GUIDED PLACEMENT OF A TUNNELED DIALYSIS CATHETER  Physician: Stephan Minister. Henn, MD  FLUOROSCOPY TIME:  6 seconds  MEDICATIONS: 1 mg versed, 50 mcg fentanyl. A radiology nurse monitored the patient for moderate sedation. As antibiotic prophylaxis, Ancef was ordered pre-procedure and administered intravenously within one hour of incision.  ANESTHESIA/SEDATION: Moderate sedation time: 28 minutes  PROCEDURE: Informed consent was obtained for placement of a tunneled dialysis catheter. Specifically, the risks of bleeding and infection were discussed with the patient. The patient was placed supine on the interventional table. Ultrasound confirmed a patent right internal jugularvein. Ultrasound images were obtained for documentation. The right side of the neck was prepped and draped in a sterile fashion. The right side of the neck was anesthetized with 1% lidocaine. Maximal barrier sterile technique was utilized including caps, mask, sterile gowns, sterile gloves, sterile drape, hand hygiene and skin antiseptic. A small incision was made with #11 blade scalpel. A 21 gauge needle directed into the right internal jugular vein with ultrasound guidance. A micropuncture dilator set was placed. A 23 cm tip to cuff HemoSplit catheter was selected. The skin below the right clavicle was anesthetized and a small incision was made with an #11 blade scalpel. A subcutaneous tunnel was formed to the vein dermatotomy site. The catheter was brought  through the tunnel. The vein dermatotomy site was dilated to accommodate a peel-away sheath. The catheter was placed through the peel-away sheath and directed into the central venous structures. The tip of the catheter was placed in the right atrium with fluoroscopy. Fluoroscopic images were obtained for documentation. Both lumens were found to aspirate and flush well. The proper amount of heparin was flushed in both lumens. The vein dermatotomy site was closed using a single layer of absorbable suture and Dermabond. The catheter was secured to the skin using Prolene suture. Gel-Foam was placed in subcutaneous tunnel. Bandage placed over the catheter site.  FINDINGS: Catheter tip in the right atrium.  COMPLICATIONS: None  IMPRESSION: Successful placement of a right jugular tunneled dialysis catheter using ultrasound and fluoroscopic guidance.  Electronically Signed   By: Markus Daft M.D.   On: 02/18/2014 13:18   Ir US Guide Vasc Access Right  02/18/2014   INDICATION: 49 year old with end-stage renal disease. Patient needs a tunneled dialysis catheter for hemodialysis. Patient currently has a non tunneled right groin catheter.  EXAM: FLUOROSCOPIC AND ULTRASOUND GUIDED PLACEMENT OF A TUNNELED DIALYSIS CATHETER  Physician: Stephan Minister. Henn, MD  FLUOROSCOPY TIME:  6 seconds  MEDICATIONS: 1 mg versed, 50 mcg fentanyl. A radiology nurse monitored the patient for moderate sedation. As antibiotic prophylaxis, Ancef was ordered pre-procedure and administered intravenously within one hour of incision.  ANESTHESIA/SEDATION: Moderate sedation time: 28 minutes  PROCEDURE: Informed consent was obtained for placement of a tunneled dialysis catheter. Specifically, the risks of bleeding and infection were discussed with the patient. The patient was placed supine on the interventional table. Ultrasound confirmed a patent right internal jugularvein. Ultrasound images were obtained for documentation. The right side of the neck was  prepped and draped in a sterile fashion. The right side of the neck was anesthetized with 1% lidocaine. Maximal barrier sterile technique was utilized including caps, mask, sterile gowns, sterile gloves, sterile drape, hand hygiene and skin antiseptic. A small incision was made with #11 blade scalpel. A 21 gauge needle directed into the right internal jugular vein with ultrasound guidance. A micropuncture dilator set was placed. A 23 cm tip to cuff HemoSplit catheter was selected. The skin below the right clavicle was anesthetized and a small incision was made with an #11 blade scalpel. A subcutaneous tunnel was formed to the vein dermatotomy site. The catheter was brought through the tunnel. The vein dermatotomy site was dilated to accommodate a peel-away sheath. The catheter was placed through the peel-away sheath and directed into the central venous structures. The tip of the catheter was placed in the right atrium with fluoroscopy. Fluoroscopic images were obtained for documentation. Both lumens were found to aspirate and flush well. The proper amount of heparin was flushed in both lumens. The vein dermatotomy site was closed using a single layer of absorbable suture and Dermabond. The catheter was secured to the skin using Prolene suture. Gel-Foam was placed in subcutaneous tunnel. Bandage placed over the catheter site.  FINDINGS: Catheter tip in the right atrium.  COMPLICATIONS: None  IMPRESSION: Successful placement of a right jugular tunneled dialysis catheter using ultrasound and fluoroscopic guidance.   Electronically Signed   By: Markus Daft M.D.   On: 02/18/2014 13:18   Dg Abd Acute W/chest  02/16/2014   CLINICAL DATA:  Generalized abdominal pain and nausea with vomiting since this morning.  EXAM: ACUTE ABDOMEN SERIES (ABDOMEN 2 VIEW & CHEST 1 VIEW)  COMPARISON:  Chest radiographs 06/25/2013 and abdominal series 03/28/2013  FINDINGS: Cardiac silhouette is mildly accentuated by hypoinflation but does  not appear significantly changed from the prior study and is likely upper limits of normal limits in size. No airspace consolidation, edema, pleural effusion, or pneumothorax is identified.  There is no evidence of intraperitoneal free air. Gas and stool are present throughout nondilated colon. A small amount of gas is seen in a few scattered loops of grossly nondilated small bowel, although evaluation for obstruction is limited by the overall paucity of small bowel. Small calcifications in the pelvis likely represent phleboliths. Thoracolumbar osteophytosis is noted.  IMPRESSION: 1. No evidence of acute airspace disease. 2. No definite evidence of bowel obstruction, however evaluation is somewhat limited by a paucity of small bowel gas.   Electronically Signed  By: Logan Bores   On: 02/16/2014 14:22    Microbiology: Recent Results (from the past 240 hour(s))  MRSA PCR Screening     Status: None   Collection Time: 02/16/14  7:30 PM  Result Value Ref Range Status   MRSA by PCR NEGATIVE NEGATIVE Final    Comment:        The GeneXpert MRSA Assay (FDA approved for NASAL specimens only), is one component of a comprehensive MRSA colonization surveillance program. It is not intended to diagnose MRSA infection nor to guide or monitor treatment for MRSA infections.      Labs: Basic Metabolic Panel:  Recent Labs Lab 02/17/14 0524 02/18/14 0444 02/19/14 0505 02/20/14 0514 02/21/14 0726  NA 139 139 137 134* 136  K 5.3* 5.4* 4.8 5.0 5.9*  CL 110 108 101 98 98  CO2 23 24 26 25 25   GLUCOSE 93 85 103* 76 81  BUN 87* 74* 46* 65* 87*  CREATININE 6.31* 6.29* 5.90* 8.37* 9.93*  CALCIUM 8.0* 8.2* 8.4 8.4 8.8  PHOS 5.6*  --   --   --  7.3*   Liver Function Tests:  Recent Labs Lab 02/17/14 0524  AST 26  ALT 18  ALKPHOS 70  BILITOT 1.1  PROT 6.8  ALBUMIN 3.1*    Recent Labs Lab 02/16/14 1220  LIPASE CANCELLED BY LAB   No results for input(s): AMMONIA in the last 168  hours. CBC:  Recent Labs Lab 02/16/14 1220 02/16/14 1425 02/17/14 0524 02/18/14 0444 02/19/14 0505 02/21/14 0726  WBC 10.3  --  6.7 5.7 10.8* 10.3  NEUTROABS 8.4*  --   --   --   --   --   HGB 12.2* 13.3 10.7* 11.1* 12.5* 13.1  HCT 39.0 39.0 33.2* 34.6* 39.8 42.6  MCV 87.6  --  87.1 88.5 88.6 87.7  PLT 190  --  126* 91* 93* 122*   Cardiac Enzymes: No results for input(s): CKTOTAL, CKMB, CKMBINDEX, TROPONINI in the last 168 hours. BNP: BNP (last 3 results) No results for input(s): PROBNP in the last 8760 hours. CBG:  Recent Labs Lab 02/21/14 0729 02/21/14 1630 02/21/14 1701 02/21/14 2201 02/22/14 0750  GLUCAP 66* 47* 112* 93 74   Signed:  Mylei Brackeen K  [facility] 02/22/2014, 11:06 AM

## 2014-02-22 NOTE — Progress Notes (Signed)
Patient discharged home.  Femoral cath removed by Dr. Arnoldo Morale - WNL.  Reviewed medications with patient.  Dialysis scheduled for tomorrow - patient and wife aware.  Instructed and educated on Renal diet and on holding BP meds prior to dialysis treatment.  Verbalizes understanding.  No questions at this time.  Patient stable to DC home.

## 2014-02-22 NOTE — Progress Notes (Signed)
Jack Irwin  MRN: [id number]  DOB/AGE: Nov 16, 1965 49 y.o.  Primary Care Physician:ROBERSON, Carmell Austria, MD  Admit date: 02/16/2014  Chief Complaint:  Chief Complaint  Patient presents with  . Abdominal Pain    S-Pt presented on  02/16/2014 with  Chief Complaint  Patient presents with  . Abdominal Pain  .    Pt today feels better  Meds . calcitRIOL  0.25 mcg Oral Daily  . calcium acetate  667 mg Oral TID WC  . febuxostat  80 mg Oral Daily  . metoprolol tartrate  37.5 mg Oral BID  . polyethylene glycol  17 g Oral Daily  . sodium chloride  3 mL Intravenous Q12H      Physical Exam: Vital signs in last 24 hours: Temp:  [97.6 F (36.4 C)-98.5 F (36.9 C)] 98 F (36.7 C) (01/20 1024) Pulse Rate:  [79-96] 85 (01/20 1024) Resp:  [17-20] 20 (01/20 1024) BP: (111-133)/(77-90) 133/84 mmHg (01/20 1024) SpO2:  [97 %-100 %] 98 % (01/20 1024) Weight change:  Last BM Date: 02/22/14  Intake/Output from previous day: 01/19 0701 - 01/20 0700 In: 120 [P.O.:120] Out: 2344 [Urine:375] Total I/O In: 480 [P.O.:480] Out: -    Physical Exam: General- pt is awake,alert, oriented to time place and person Resp- No acute REsp distress, CTA B/L NO Rhonchi CVS- S1S2 regular ij rate and rhythm GIT- BS+, soft, NT, ND EXT- NO LE Edema, Cyanosis Access- PC in situ               Femoral in situ as well.  Lab Results: CBC  Recent Labs  02/21/14 0726  WBC 10.3  HGB 13.1  HCT 42.6  PLT 122*    BMET  Recent Labs  02/20/14 0514 02/21/14 0726  NA 134* 136  K 5.0 5.9*  CL 98 98  CO2 25 25  GLUCOSE 76 81  BUN 65* 87*  CREATININE 8.37* 9.93*  CALCIUM 8.4 8.8    MICRO Recent Results (from the past 240 hour(s))  MRSA PCR Screening     Status: None   Collection Time: 02/16/14  7:30 PM  Result Value Ref Range Status   MRSA by PCR NEGATIVE NEGATIVE Final    Comment:        The GeneXpert MRSA Assay (FDA approved for NASAL specimens only), is one component of  a comprehensive MRSA colonization surveillance program. It is not intended to diagnose MRSA infection nor to guide or monitor treatment for MRSA infections.       Lab Results  Component Value Date   PTH <2.5 Result repeated and verified.* 09/12/2009   CALCIUM 8.8 02/21/2014   CAION 1.00* 02/16/2014   PHOS 7.3* 02/21/2014         Impression: 1)Renal CKD stage 5=> Now progressed to ESRD               Initiated on HD since 02/16/14 .               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   5)DM-Primary MD following  6)Electrolytes Hyperkalemic   Sec to ESRD Hyponatremic    Now better    Sec to ESRD  7)Acid base Co2 at goal     Plan:   Will d/c femoral cath today Educated pt to please follow up with his outpt dialysis appt.     BHUTANI,MANPREET S 02/22/2014, 2:31 PM

## 2014-02-22 NOTE — Progress Notes (Signed)
Right femoral dialysis catheter removed without difficulty. Pressure held. Patient tolerated procedure well. Okay for discharge.

## 2014-02-23 ENCOUNTER — Other Ambulatory Visit: Payer: Self-pay

## 2014-02-23 NOTE — Care Management Utilization Note (Signed)
UR completed 

## 2014-03-03 ENCOUNTER — Emergency Department (HOSPITAL_COMMUNITY)
Admission: EM | Admit: 2014-03-03 | Discharge: 2014-03-03 | Disposition: A | Discharge status: Home / Self Care | Payer: Medicare Other | Attending: Emergency Medicine | Admitting: Emergency Medicine

## 2014-03-03 ENCOUNTER — Encounter (HOSPITAL_COMMUNITY): Payer: Self-pay

## 2014-03-03 DIAGNOSIS — J029 Acute pharyngitis, unspecified: Secondary | ICD-10-CM | POA: Diagnosis not present

## 2014-03-03 DIAGNOSIS — R1084 Generalized abdominal pain: Secondary | ICD-10-CM | POA: Insufficient documentation

## 2014-03-03 DIAGNOSIS — Z79899 Other long term (current) drug therapy: Secondary | ICD-10-CM | POA: Diagnosis not present

## 2014-03-03 DIAGNOSIS — Z862 Personal history of diseases of the blood and blood-forming organs and certain disorders involving the immune mechanism: Secondary | ICD-10-CM | POA: Diagnosis not present

## 2014-03-03 DIAGNOSIS — Z86718 Personal history of other venous thrombosis and embolism: Secondary | ICD-10-CM | POA: Diagnosis not present

## 2014-03-03 DIAGNOSIS — M1990 Unspecified osteoarthritis, unspecified site: Secondary | ICD-10-CM | POA: Diagnosis not present

## 2014-03-03 DIAGNOSIS — N184 Chronic kidney disease, stage 4 (severe): Secondary | ICD-10-CM | POA: Diagnosis not present

## 2014-03-03 DIAGNOSIS — K0889 Other specified disorders of teeth and supporting structures: Secondary | ICD-10-CM

## 2014-03-03 DIAGNOSIS — Z872 Personal history of diseases of the skin and subcutaneous tissue: Secondary | ICD-10-CM | POA: Insufficient documentation

## 2014-03-03 DIAGNOSIS — K088 Other specified disorders of teeth and supporting structures: Secondary | ICD-10-CM | POA: Diagnosis not present

## 2014-03-03 DIAGNOSIS — I129 Hypertensive chronic kidney disease with stage 1 through stage 4 chronic kidney disease, or unspecified chronic kidney disease: Secondary | ICD-10-CM | POA: Diagnosis not present

## 2014-03-03 DIAGNOSIS — E1140 Type 2 diabetes mellitus with diabetic neuropathy, unspecified: Secondary | ICD-10-CM | POA: Diagnosis not present

## 2014-03-03 DIAGNOSIS — Z792 Long term (current) use of antibiotics: Secondary | ICD-10-CM | POA: Insufficient documentation

## 2014-03-03 DIAGNOSIS — R51 Headache: Secondary | ICD-10-CM | POA: Insufficient documentation

## 2014-03-03 DIAGNOSIS — G8929 Other chronic pain: Secondary | ICD-10-CM | POA: Diagnosis not present

## 2014-03-03 LAB — CBG MONITORING, ED: Glucose-Capillary: 59 mg/dL — ABNORMAL LOW (ref 70–99)

## 2014-03-03 MED ORDER — OXYCODONE-ACETAMINOPHEN 5-325 MG PO TABS
1.0000 | ORAL_TABLET | Freq: Once | ORAL | Status: AC
  Administered 2014-03-03: 1 via ORAL
  Filled 2014-03-03: qty 1

## 2014-03-03 MED ORDER — PENICILLIN V POTASSIUM 500 MG PO TABS: 500.0000 mg | ORAL_TABLET | Freq: Three times a day (TID) | ORAL | Status: DC

## 2014-03-03 MED ORDER — OXYCODONE-ACETAMINOPHEN 5-325 MG PO TABS: 1.0000 | ORAL_TABLET | Freq: Three times a day (TID) | ORAL | Status: DC | PRN

## 2014-03-03 MED ORDER — PENICILLIN V POTASSIUM 250 MG PO TABS
500.0000 mg | ORAL_TABLET | Freq: Once | ORAL | Status: AC
  Administered 2014-03-03: 500 mg via ORAL
  Filled 2014-03-03: qty 2

## 2014-03-03 NOTE — ED Notes (Signed)
Pt given grahm crackers, peanut butter and drink. Pt is a/o x 4. EDP aware. Pt d/c

## 2014-03-03 NOTE — ED Notes (Signed)
Pt c/o toothache and abd pain with nausea.  Reports had dialysis yesterday and is scheduled for dialysis tomorrow. Denies any diarrhea, lbm was this morning.

## 2014-03-03 NOTE — Discharge Instructions (Signed)
For the tooth pain take the penicillin as directed for the next 7 days. Take pain medicine as needed. Make an appointment to follow-up with dentist to have the tooth taken care of. For the epigastric abdominal pain has been present for several weeks. Would recommend follow-up with your regular doctor. May require GI referral. Return for any new or worse symptoms. Continue dialysis as scheduled

## 2014-03-03 NOTE — ED Provider Notes (Signed)
CSN: [id number]     Arrival date & time 03/03/14  1349 History  This chart was scribed for Fredia Sorrow, MD by Tula Nakayama, ED Scribe. This patient was seen in room APA19/APA19 and the patient's care was started at 2:21 PM.    Chief Complaint  Patient presents with  . Abdominal Pain   Patient is a 49 y.o. male presenting with abdominal pain and tooth pain. The history is provided by the patient. No language interpreter was used.  Abdominal Pain Pain location:  Generalized Pain quality: aching   Pain radiates to:  Epigastric region Pain severity:  Moderate Onset quality:  Gradual Duration:  2 weeks Timing:  Constant Progression:  Unchanged Associated symptoms: nausea and sore throat   Associated symptoms: no chest pain, no chills, no cough, no diarrhea, no dysuria, no fever, no shortness of breath and no vomiting   Dental Pain Location:  Upper Quality:  Throbbing Severity:  Moderate Onset quality:  Unable to specify Timing:  Constant Progression:  Worsening Chronicity:  New Associated symptoms: headaches   Associated symptoms: no congestion, no fever and no neck pain    HPI Comments: Jack Irwin is a 49 y.o. male who presents to the Emergency Department complaining of constant epigastric abdominal pain that started 2 week ago. He was hospitalized on 1/14 for hyperkalemia and states abdominal pain was present during admission. Pt is on dialysis on Tuesday, Thursday and Saturdays. He has catheter in right chest currently, but is getting a fistula tomorrow.   Pt also complains of gradually worsening, 8/10, throbbing upper left tooth pain. He denies drainage.    Past Medical History  Diagnosis Date  . Hypertension   . Diabetes mellitus   . Gout   . Chronic back pain   . BPH (benign prostatic hyperplasia)   . Anemia   . DVT (deep venous thrombosis)   . Neuropathy   . Decubitus ulcer     of 2nd toes of both feet.  . Edema leg   . Constipation   . Venous  (peripheral) insufficiency   . Neuropathy, diabetic   . Physical deconditioning   . Poor balance   . Difficulty walking   . Lack of coordination   . Arthritis   . Renal insufficiency   . Chronic kidney disease   . Chronic kidney disease (CKD), stage IV (severe)    Past Surgical History  Procedure Laterality Date  . None    . Insertion of dialysis catheter     Family History  Problem Relation Age of Onset  . Diabetes Mother   . Hypertension Mother   . Heart failure Mother   . Hyperlipidemia Mother   . Heart attack Mother   . Cancer Father   . Diabetes Father   . Hypertension Father   . Hyperlipidemia Father    History  Substance Use Topics  . Smoking status: Never Smoker   . Smokeless tobacco: Never Used  . Alcohol Use: No    Review of Systems  Constitutional: Negative for fever and chills.  HENT: Positive for dental problem and sore throat. Negative for congestion and rhinorrhea.   Eyes: Negative for visual disturbance.  Respiratory: Negative for cough and shortness of breath.   Cardiovascular: Positive for leg swelling. Negative for chest pain.  Gastrointestinal: Positive for nausea and abdominal pain. Negative for vomiting and diarrhea.  Genitourinary: Negative for dysuria.  Musculoskeletal: Negative for back pain and neck pain.  Skin: Negative for rash.  Neurological:  Positive for headaches.  Hematological: Bruises/bleeds easily.  Psychiatric/Behavioral: Negative for confusion.      Allergies  Review of patient's allergies indicates no known allergies.  Home Medications   Prior to Admission medications   Medication Sig Start Date End Date Taking? Authorizing Provider  Alum & Mag Hydroxide-Simeth (MAGIC MOUTHWASH W/LIDOCAINE) SOLN Gargle and spit 10 mL every 4 hours prn throat pain.    Pharmacy - mix equal parts 06/25/13   Evalee Jefferson, PA-C  calcitRIOL (ROCALTROL) 0.25 MCG capsule Take 1 capsule by mouth daily.  06/15/13   Historical Provider, MD  calcium  acetate (PHOSLO) 667 MG capsule Take 667 mg by mouth 3 (three) times daily with meals.     Historical Provider, MD  Cholecalciferol (VITAMIN D3) 1000 UNITS CAPS Take 1 capsule by mouth daily.    Historical Provider, MD  COD LIVER OIL PO Take 1 capsule by mouth daily.    Historical Provider, MD  colchicine (COLCRYS) 0.6 MG tablet Take 1 tablet (0.6 mg total) by mouth daily. Patient taking differently: Take 0.6 mg by mouth daily as needed. For gout 10/04/13   [name], MD  febuxostat (ULORIC) 40 MG tablet Take 3 tablets (120 mg total) by mouth daily. Patient taking differently: Take 80 mg by mouth daily.  10/04/13   [name], MD  glucosamine-chondroitin 500-400 MG tablet Take 1 tablet by mouth daily as needed (bones).     Historical Provider, MD  metoprolol tartrate (LOPRESSOR) 25 MG tablet Take 37.5 mg by mouth 2 (two) times daily.  11/22/13   Historical Provider, MD  ondansetron (ZOFRAN ODT) 4 MG disintegrating tablet 4mg  ODT q4 hours prn nausea/vomit 02/16/14   Maudry Diego, MD  oxyCODONE-acetaminophen (PERCOCET) 5-325 MG per tablet Take 1-2 tablets by mouth every 4 (four) hours as needed. Patient taking differently: Take 1-2 tablets by mouth every 4 (four) hours as needed for moderate pain.  01/04/14   Nat Christen, MD  oxyCODONE-acetaminophen (PERCOCET/ROXICET) 5-325 MG per tablet Take 1-2 tablets by mouth every 8 (eight) hours as needed for moderate pain or severe pain. 03/03/14   Fredia Sorrow, MD  penicillin v potassium (VEETID) 500 MG tablet Take 1 tablet (500 mg total) by mouth 3 (three) times daily. 03/03/14   Fredia Sorrow, MD  sildenafil (VIAGRA) 100 MG tablet Take 100 mg by mouth daily as needed for erectile dysfunction.    Historical Provider, MD  sodium bicarbonate 650 MG tablet Take 650 mg by mouth 3 (three) times daily.    Historical Provider, MD  traMADol (ULTRAM) 50 MG tablet Take 1 tablet (50 mg total) by mouth every 6 (six) hours as needed. Patient taking differently: Take 50  mg by mouth every 6 (six) hours as needed for moderate pain.  12/13/13   Glendell Docker, NP   BP 100/68 mmHg  Pulse 66  Temp(Src) 97.6 F (36.4 C) (Oral)  Ht 5\' 11"  (1.803 m)  Wt 268 lb (121.564 kg)  BMI 37.39 kg/m2  SpO2 97% Physical Exam  Constitutional: He is oriented to person, place, and time. He appears well-developed and well-nourished. No distress.  HENT:  Head: Normocephalic and atraumatic.  Mouth/Throat: Oropharynx is clear and moist.  MMM; sclera clear; oropharynx normal; tender to left upper premolar; no purulent drainage; no major swelling to gum area  Eyes: Conjunctivae and EOM are normal.  Neck: Neck supple. No tracheal deviation present.  Cardiovascular: Normal rate, regular rhythm and normal heart sounds.   No murmur heard. Pulmonary/Chest: Effort  normal. No respiratory distress.  Lungs clear bilaterally  Abdominal: Soft. Bowel sounds are normal. There is no tenderness.  Non-tender to palpation  Neurological: He is alert and oriented to person, place, and time. No cranial nerve deficit. He exhibits normal muscle tone. Coordination normal.  Skin: Skin is warm and dry.  Psychiatric: He has a normal mood and affect. His behavior is normal.  Nursing note and vitals reviewed.   ED Course  Procedures (including critical care time) DIAGNOSTIC STUDIES: Oxygen Saturation is 97% on RA, normal by my interpretation.    COORDINATION OF CARE: 2:29 PM Discussed treatment plan with pt at bedside and pt agreed to plan.   Labs Review Labs Reviewed  CBG MONITORING, ED    Imaging Review No results found.   EKG Interpretation None      MDM   Final diagnoses:  Toothache    Patient with history of dialysis. Normally dialyzed Tuesdays Thursdays and Saturdays. Patient was dialyzed yesterday. Patient here with the main complaint is the left upper tooth pain. This can present for 2 days. Patient also still with epigastric discomfort and some nausea intermittently has  been present for several weeks. That was present during his last hospitalization without any significant findings. Patient does have primary care doctor to follow-up with. May require upper endoscopy. Patient states that that is baseline and there is no significant change there. For the treatment of the tooth patient be started on penicillin given pain medicine and recommendation to follow-up with dentist.    I personally performed the services described in this documentation, which was scribed in my presence. The recorded information has been reviewed and is accurate.     Fredia Sorrow, MD 03/03/14 1434

## 2014-03-08 ENCOUNTER — Encounter (HOSPITAL_COMMUNITY): Payer: Self-pay | Admitting: *Deleted

## 2014-03-08 ENCOUNTER — Other Ambulatory Visit: Payer: Self-pay

## 2014-03-08 NOTE — Progress Notes (Signed)
   03/08/14 1836  OBSTRUCTIVE SLEEP APNEA  Have you ever been diagnosed with sleep apnea through a sleep study? No  Do you snore loudly (loud enough to be heard through closed doors)?  1  Do you often feel tired, fatigued, or sleepy during the daytime? 1  Has anyone observed you stop breathing during your sleep? 0  Do you have, or are you being treated for high blood pressure? 1  BMI more than 35 kg/m2? 1  Age over 47 years old? 0  Neck circumference greater than 40 cm/16 inches? 1  Gender: 1  Obstructive Sleep Apnea Score 6  Score 4 or greater  Results sent to PCP

## 2014-03-09 ENCOUNTER — Ambulatory Visit (HOSPITAL_COMMUNITY): Payer: Medicare Other | Admitting: Certified Registered Nurse Anesthetist

## 2014-03-09 ENCOUNTER — Ambulatory Visit (HOSPITAL_COMMUNITY): Payer: Medicare Other

## 2014-03-09 ENCOUNTER — Ambulatory Visit (HOSPITAL_COMMUNITY)
Admission: RE | Admit: 2014-03-09 | Discharge: 2014-03-09 | Disposition: A | Discharge status: Home / Self Care | Payer: Medicare Other | Source: Ambulatory Visit | Attending: Vascular Surgery | Admitting: Vascular Surgery

## 2014-03-09 ENCOUNTER — Encounter (HOSPITAL_COMMUNITY)
Admission: RE | Disposition: A | Discharge status: Home / Self Care | Payer: Self-pay | Source: Ambulatory Visit | Attending: Vascular Surgery

## 2014-03-09 ENCOUNTER — Encounter (HOSPITAL_COMMUNITY): Payer: Self-pay | Admitting: *Deleted

## 2014-03-09 DIAGNOSIS — M549 Dorsalgia, unspecified: Secondary | ICD-10-CM | POA: Diagnosis not present

## 2014-03-09 DIAGNOSIS — T8241XD Breakdown (mechanical) of vascular dialysis catheter, subsequent encounter: Secondary | ICD-10-CM | POA: Diagnosis not present

## 2014-03-09 DIAGNOSIS — I739 Peripheral vascular disease, unspecified: Secondary | ICD-10-CM | POA: Insufficient documentation

## 2014-03-09 DIAGNOSIS — M109 Gout, unspecified: Secondary | ICD-10-CM | POA: Diagnosis not present

## 2014-03-09 DIAGNOSIS — N185 Chronic kidney disease, stage 5: Secondary | ICD-10-CM | POA: Diagnosis present

## 2014-03-09 DIAGNOSIS — G8929 Other chronic pain: Secondary | ICD-10-CM | POA: Diagnosis not present

## 2014-03-09 DIAGNOSIS — N186 End stage renal disease: Secondary | ICD-10-CM | POA: Diagnosis not present

## 2014-03-09 DIAGNOSIS — M1990 Unspecified osteoarthritis, unspecified site: Secondary | ICD-10-CM | POA: Diagnosis not present

## 2014-03-09 DIAGNOSIS — Z86718 Personal history of other venous thrombosis and embolism: Secondary | ICD-10-CM | POA: Diagnosis not present

## 2014-03-09 DIAGNOSIS — Z419 Encounter for procedure for purposes other than remedying health state, unspecified: Secondary | ICD-10-CM

## 2014-03-09 DIAGNOSIS — Z791 Long term (current) use of non-steroidal anti-inflammatories (NSAID): Secondary | ICD-10-CM | POA: Insufficient documentation

## 2014-03-09 DIAGNOSIS — E1140 Type 2 diabetes mellitus with diabetic neuropathy, unspecified: Secondary | ICD-10-CM | POA: Diagnosis not present

## 2014-03-09 DIAGNOSIS — T82898A Other specified complication of vascular prosthetic devices, implants and grafts, initial encounter: Secondary | ICD-10-CM | POA: Diagnosis not present

## 2014-03-09 DIAGNOSIS — Y838 Other surgical procedures as the cause of abnormal reaction of the patient, or of later complication, without mention of misadventure at the time of the procedure: Secondary | ICD-10-CM | POA: Diagnosis not present

## 2014-03-09 DIAGNOSIS — I120 Hypertensive chronic kidney disease with stage 5 chronic kidney disease or end stage renal disease: Secondary | ICD-10-CM | POA: Insufficient documentation

## 2014-03-09 DIAGNOSIS — I509 Heart failure, unspecified: Secondary | ICD-10-CM | POA: Diagnosis not present

## 2014-03-09 HISTORY — PX: INSERTION OF DIALYSIS CATHETER: SHX1324

## 2014-03-09 HISTORY — PX: AV FISTULA PLACEMENT: SHX1204

## 2014-03-09 LAB — POCT I-STAT 4, (NA,K, GLUC, HGB,HCT)
Glucose, Bld: 82 mg/dL (ref 70–99)
HCT: 38 % — ABNORMAL LOW (ref 39.0–52.0)
Hemoglobin: 12.9 g/dL — ABNORMAL LOW (ref 13.0–17.0)
Potassium: 4.9 mmol/L (ref 3.5–5.1)
SODIUM: 140 mmol/L (ref 135–145)

## 2014-03-09 LAB — GLUCOSE, CAPILLARY
GLUCOSE-CAPILLARY: 42 mg/dL — AB (ref 70–99)
GLUCOSE-CAPILLARY: 79 mg/dL (ref 70–99)
Glucose-Capillary: 52 mg/dL — ABNORMAL LOW (ref 70–99)
Glucose-Capillary: 75 mg/dL (ref 70–99)
Glucose-Capillary: 78 mg/dL (ref 70–99)
Glucose-Capillary: 97 mg/dL (ref 70–99)

## 2014-03-09 SURGERY — INSERTION OF DIALYSIS CATHETER
Anesthesia: Monitor Anesthesia Care | Site: Neck | Laterality: Left

## 2014-03-09 MED ORDER — LIDOCAINE HCL (PF) 1 % IJ SOLN
INTRAMUSCULAR | Status: DC | PRN
  Administered 2014-03-09: 23 mL

## 2014-03-09 MED ORDER — HEPARIN SODIUM (PORCINE) 1000 UNIT/ML IJ SOLN
INTRAMUSCULAR | Status: AC
  Filled 2014-03-09: qty 1

## 2014-03-09 MED ORDER — MIDAZOLAM HCL 2 MG/2ML IJ SOLN
INTRAMUSCULAR | Status: AC
  Filled 2014-03-09: qty 2

## 2014-03-09 MED ORDER — PAPAVERINE HCL 30 MG/ML IJ SOLN
INTRAMUSCULAR | Status: AC
  Filled 2014-03-09: qty 2

## 2014-03-09 MED ORDER — THROMBIN 20000 UNITS EX SOLR
CUTANEOUS | Status: AC
  Filled 2014-03-09: qty 20000

## 2014-03-09 MED ORDER — PROTAMINE SULFATE 10 MG/ML IV SOLN
INTRAVENOUS | Status: AC
  Filled 2014-03-09: qty 5

## 2014-03-09 MED ORDER — CEFAZOLIN SODIUM-DEXTROSE 2-3 GM-% IV SOLR
INTRAVENOUS | Status: AC
  Filled 2014-03-09: qty 100

## 2014-03-09 MED ORDER — PAPAVERINE HCL 30 MG/ML IJ SOLN
INTRAMUSCULAR | Status: DC | PRN
  Administered 2014-03-09: 60 mg

## 2014-03-09 MED ORDER — ONDANSETRON HCL 4 MG/2ML IJ SOLN: 4.0000 mg | Freq: Once | INTRAMUSCULAR | Status: DC | PRN

## 2014-03-09 MED ORDER — SODIUM CHLORIDE 0.9 % IV SOLN
INTRAVENOUS | Status: DC | PRN
  Administered 2014-03-09: 10:00:00 via INTRAVENOUS

## 2014-03-09 MED ORDER — HEPARIN SODIUM (PORCINE) 1000 UNIT/ML IJ SOLN
INTRAMUSCULAR | Status: DC | PRN
  Administered 2014-03-09: 4.8 [IU]

## 2014-03-09 MED ORDER — 0.9 % SODIUM CHLORIDE (POUR BTL) OPTIME
TOPICAL | Status: DC | PRN
  Administered 2014-03-09: 1000 mL

## 2014-03-09 MED ORDER — FENTANYL CITRATE 0.05 MG/ML IJ SOLN
INTRAMUSCULAR | Status: AC
  Filled 2014-03-09: qty 5

## 2014-03-09 MED ORDER — FENTANYL CITRATE 0.05 MG/ML IJ SOLN
INTRAMUSCULAR | Status: DC | PRN
  Administered 2014-03-09 (×3): 50 ug via INTRAVENOUS
  Administered 2014-03-09 (×2): 25 ug via INTRAVENOUS

## 2014-03-09 MED ORDER — LIDOCAINE HCL (PF) 1 % IJ SOLN
INTRAMUSCULAR | Status: AC
  Filled 2014-03-09: qty 30

## 2014-03-09 MED ORDER — MIDAZOLAM HCL 5 MG/5ML IJ SOLN
INTRAMUSCULAR | Status: DC | PRN
  Administered 2014-03-09: 2 mg via INTRAVENOUS

## 2014-03-09 MED ORDER — PROTAMINE SULFATE 10 MG/ML IV SOLN
INTRAVENOUS | Status: DC | PRN
  Administered 2014-03-09: 30 mg via INTRAVENOUS

## 2014-03-09 MED ORDER — HYDROMORPHONE HCL 1 MG/ML IJ SOLN
INTRAMUSCULAR | Status: AC
  Administered 2014-03-09: 0.5 mg via INTRAVENOUS
  Filled 2014-03-09: qty 1

## 2014-03-09 MED ORDER — HEPARIN SODIUM (PORCINE) 1000 UNIT/ML IJ SOLN
INTRAMUSCULAR | Status: DC | PRN
  Administered 2014-03-09: 8 mL via INTRAVENOUS

## 2014-03-09 MED ORDER — LIDOCAINE HCL (CARDIAC) 20 MG/ML IV SOLN
INTRAVENOUS | Status: DC | PRN
  Administered 2014-03-09: 70 mg via INTRAVENOUS

## 2014-03-09 MED ORDER — DEXTROSE 5 % IV SOLN
3.0000 g | INTRAVENOUS | Status: DC | PRN
  Administered 2014-03-09: 3 g via INTRAVENOUS

## 2014-03-09 MED ORDER — SODIUM CHLORIDE 0.9 % IR SOLN
Status: DC | PRN
  Administered 2014-03-09: 500 mL

## 2014-03-09 MED ORDER — LIDOCAINE HCL (PF) 1 % IJ SOLN
INTRAMUSCULAR | Status: DC | PRN
  Administered 2014-03-09: 30 mL

## 2014-03-09 MED ORDER — DEXTROSE 50 % IV SOLN
INTRAVENOUS | Status: AC
  Filled 2014-03-09: qty 50

## 2014-03-09 MED ORDER — DEXTROSE 50 % IV SOLN
INTRAVENOUS | Status: DC | PRN
  Administered 2014-03-09: 12.5 g via INTRAVENOUS

## 2014-03-09 MED ORDER — SODIUM CHLORIDE 0.9 % IV SOLN
INTRAVENOUS | Status: DC
  Administered 2014-03-09: 09:00:00 via INTRAVENOUS

## 2014-03-09 MED ORDER — HYDROMORPHONE HCL 1 MG/ML IJ SOLN
0.2500 mg | INTRAMUSCULAR | Status: DC | PRN
  Administered 2014-03-09 (×2): 0.5 mg via INTRAVENOUS

## 2014-03-09 MED ORDER — ONDANSETRON HCL 4 MG/2ML IJ SOLN
INTRAMUSCULAR | Status: DC | PRN
  Administered 2014-03-09: 4 mg via INTRAVENOUS

## 2014-03-09 MED ORDER — PROPOFOL 10 MG/ML IV BOLUS
INTRAVENOUS | Status: DC | PRN
  Administered 2014-03-09 (×7): 20 mg via INTRAVENOUS

## 2014-03-09 MED ORDER — OXYCODONE-ACETAMINOPHEN 5-325 MG PO TABS: 1.0000 | ORAL_TABLET | ORAL | Status: DC | PRN

## 2014-03-09 SURGICAL SUPPLY — 66 items
ADH SKN CLS APL DERMABOND .7 (GAUZE/BANDAGES/DRESSINGS) ×3
BAG DECANTER FOR FLEXI CONT (MISCELLANEOUS) ×4 IMPLANT
BIOPATCH RED 1 DISK 7.0 (GAUZE/BANDAGES/DRESSINGS) ×4 IMPLANT
CANISTER SUCTION 2500CC (MISCELLANEOUS) ×4 IMPLANT
CANNULA VESSEL 3MM 2 BLNT TIP (CANNULA) ×2 IMPLANT
CATH BEACON 5.038 65CM KMP-01 (CATHETERS) ×2 IMPLANT
CATH CANNON HEMO 15F 50CM (CATHETERS) IMPLANT
CATH CANNON HEMO 15FR 19 (HEMODIALYSIS SUPPLIES) IMPLANT
CATH CANNON HEMO 15FR 23CM (HEMODIALYSIS SUPPLIES) IMPLANT
CATH CANNON HEMO 15FR 31CM (HEMODIALYSIS SUPPLIES) IMPLANT
CATH CANNON HEMO 15FR 32 (HEMODIALYSIS SUPPLIES) IMPLANT
CATH CANNON HEMO 15FR 32CM (HEMODIALYSIS SUPPLIES) ×4 IMPLANT
CHLORAPREP W/TINT 26ML (MISCELLANEOUS) ×4 IMPLANT
CLIP TI MEDIUM 24 (CLIP) ×4 IMPLANT
CLIP TI WIDE RED SMALL 24 (CLIP) ×4 IMPLANT
COVER PROBE W GEL 5X96 (DRAPES) ×4 IMPLANT
COVER SURGICAL LIGHT HANDLE (MISCELLANEOUS) ×6 IMPLANT
DECANTER SPIKE VIAL GLASS SM (MISCELLANEOUS) ×4 IMPLANT
DERMABOND ADVANCED (GAUZE/BANDAGES/DRESSINGS) ×1
DERMABOND ADVANCED .7 DNX12 (GAUZE/BANDAGES/DRESSINGS) ×1 IMPLANT
DRAIN PENROSE 1/2X12 LTX STRL (WOUND CARE) IMPLANT
DRAPE C-ARM 42X72 X-RAY (DRAPES) ×4 IMPLANT
DRAPE CHEST BREAST 15X10 FENES (DRAPES) ×4 IMPLANT
ELECT REM PT RETURN 9FT ADLT (ELECTROSURGICAL) ×4
ELECTRODE REM PT RTRN 9FT ADLT (ELECTROSURGICAL) ×3 IMPLANT
GAUZE SPONGE 2X2 8PLY STRL LF (GAUZE/BANDAGES/DRESSINGS) ×4 IMPLANT
GAUZE SPONGE 4X4 16PLY XRAY LF (GAUZE/BANDAGES/DRESSINGS) ×4 IMPLANT
GLOVE BIO SURGEON STRL SZ 6.5 (GLOVE) ×4 IMPLANT
GLOVE BIO SURGEON STRL SZ7.5 (GLOVE) ×6 IMPLANT
GLOVE BIOGEL PI IND STRL 6.5 (GLOVE) ×2 IMPLANT
GLOVE BIOGEL PI IND STRL 7.0 (GLOVE) ×1 IMPLANT
GLOVE BIOGEL PI IND STRL 8 (GLOVE) ×4 IMPLANT
GLOVE BIOGEL PI INDICATOR 6.5 (GLOVE) ×2
GLOVE BIOGEL PI INDICATOR 7.0 (GLOVE) ×1
GLOVE BIOGEL PI INDICATOR 8 (GLOVE) ×2
GOWN STRL REUS W/ TWL LRG LVL3 (GOWN DISPOSABLE) ×10 IMPLANT
GOWN STRL REUS W/TWL LRG LVL3 (GOWN DISPOSABLE) ×16
GRAFT GORETEX STRT 4-7X45 (Vascular Products) ×2 IMPLANT
KIT BASIN OR (CUSTOM PROCEDURE TRAY) ×4 IMPLANT
KIT ROOM TURNOVER OR (KITS) ×4 IMPLANT
LIQUID BAND (GAUZE/BANDAGES/DRESSINGS) ×6 IMPLANT
NDL 18GX1X1/2 (RX/OR ONLY) (NEEDLE) ×2 IMPLANT
NDL HYPO 25GX1X1/2 BEV (NEEDLE) ×2 IMPLANT
NEEDLE 18GX1X1/2 (RX/OR ONLY) (NEEDLE) ×12 IMPLANT
NEEDLE 22X1 1/2 (OR ONLY) (NEEDLE) ×4 IMPLANT
NEEDLE HYPO 25GX1X1/2 BEV (NEEDLE) ×4 IMPLANT
NS IRRIG 1000ML POUR BTL (IV SOLUTION) ×4 IMPLANT
PACK CV ACCESS (CUSTOM PROCEDURE TRAY) ×4 IMPLANT
PACK SURGICAL SETUP 50X90 (CUSTOM PROCEDURE TRAY) ×4 IMPLANT
PAD ARMBOARD 7.5X6 YLW CONV (MISCELLANEOUS) ×8 IMPLANT
PROBE PENCIL 8 MHZ STRL DISP (MISCELLANEOUS) IMPLANT
SPONGE GAUZE 2X2 STER 10/PKG (GAUZE/BANDAGES/DRESSINGS) ×2
SPONGE SURGIFOAM ABS GEL 100 (HEMOSTASIS) IMPLANT
SUT ETHILON 3 0 PS 1 (SUTURE) ×6 IMPLANT
SUT PROLENE 6 0 BV (SUTURE) ×8 IMPLANT
SUT SILK 2 0 SH (SUTURE) ×4 IMPLANT
SUT VIC AB 3-0 SH 27 (SUTURE) ×4
SUT VIC AB 3-0 SH 27X BRD (SUTURE) ×3 IMPLANT
SUT VICRYL 4-0 PS2 18IN ABS (SUTURE) ×6 IMPLANT
SYR 20CC LL (SYRINGE) ×12 IMPLANT
SYR 5ML LL (SYRINGE) ×10 IMPLANT
SYR CONTROL 10ML LL (SYRINGE) ×4 IMPLANT
SYRINGE 10CC LL (SYRINGE) ×6 IMPLANT
TAPE CLOTH SURG 4X10 WHT LF (GAUZE/BANDAGES/DRESSINGS) ×4 IMPLANT
UNDERPAD 30X30 INCONTINENT (UNDERPADS AND DIAPERS) ×4 IMPLANT
WATER STERILE IRR 1000ML POUR (IV SOLUTION) ×4 IMPLANT

## 2014-03-09 NOTE — Anesthesia Postprocedure Evaluation (Signed)
  Anesthesia Post-op Note  Patient: Jack Irwin  Procedure(s) Performed: Procedure(s) (LRB): INSERTION OF DIALYSIS CATHETER (Left) INSERTION OF ARTERIOVENOUS (AV) GORE-TEX GRAFT ARM (Left)  Patient Location: PACU  Anesthesia Type: MAC  Level of Consciousness: awake and alert   Airway and Oxygen Therapy: Patient Spontanous Breathing  Post-op Pain: mild  Post-op Assessment: Post-op Vital signs reviewed, Patient's Cardiovascular Status Stable, Respiratory Function Stable, Patent Airway and No signs of Nausea or vomiting  Last Vitals:  Filed Vitals:   03/09/14 1227  BP:   Pulse: 77  Temp:   Resp: 12    Post-op Vital Signs: stable   Complications: No apparent anesthesia complications

## 2014-03-09 NOTE — H&P (View-Only) (Signed)
Patient ID: Jack Irwin, male   DOB: August 02, 1965, 49 y.o.   MRN: [id number]  Reason for Consult: To evaluate for hemodialysis access.   Referred by [name], MD  Subjective:     HPI:  Jack Irwin is a 49 y.o. male who presents for evaluation for new access. He is not yet on dialysis. He is right-handed. He denies any recent uremic symptoms. Specifically, he denies nausea, vomiting, fatigue, anorexia, or palpitations. He states that his most recent lab work showed that his GFR was 14%. He is not sure of the etiology of his renal insufficiency but I suspect it is related to his hypertension.  I have reviewed his records that were sent with him. He does have a history of hypertension which had been under good control although today in the office his blood pressure was somewhat elevated I suspect related to anxiety. His end-stage renal disease secondary to hypertension and long-term nonsteroidal anti-inflammatory use. The patient does have a history of congestive heart failure in the past although is currently well compensated. He also has a history of gout.  Past Medical History  Diagnosis Date  . Hypertension   . Diabetes mellitus   . Gout   . Chronic back pain   . BPH (benign prostatic hyperplasia)   . Anemia   . DVT (deep venous thrombosis)   . Neuropathy   . Decubitus ulcer     of 2nd toes of both feet.  . Edema leg   . Constipation   . Venous (peripheral) insufficiency   . Neuropathy, diabetic   . Physical deconditioning   . Poor balance   . Difficulty walking   . Lack of coordination   . Arthritis   . Renal insufficiency   . Chronic kidney disease   . Chronic kidney disease (CKD), stage IV (severe)    Family History  Problem Relation Age of Onset  . Diabetes Mother   . Hypertension Mother   . Heart failure Mother   . Hyperlipidemia Mother   . Heart attack Mother   . Cancer Father   . Diabetes Father   . Hypertension Father   .  Hyperlipidemia Father    Past Surgical History  Procedure Laterality Date  . None      Short Social History:  History  Substance Use Topics  . Smoking status: Never Smoker   . Smokeless tobacco: Never Used  . Alcohol Use: No    No Known Allergies  Current Outpatient Prescriptions  Medication Sig Dispense Refill  . Alum & Mag Hydroxide-Simeth (MAGIC MOUTHWASH W/LIDOCAINE) SOLN Gargle and spit 10 mL every 4 hours prn throat pain.    Pharmacy - mix equal parts 120 mL 0  . calcitRIOL (ROCALTROL) 0.25 MCG capsule Take 1 capsule by mouth daily.     . calcium acetate (PHOSLO) 667 MG capsule Take 667 mg by mouth 3 (three) times daily with meals.     . colchicine (COLCRYS) 0.6 MG tablet Take 1 tablet (0.6 mg total) by mouth daily. (Patient taking differently: Take 0.6 mg by mouth daily as needed. For gout) 10 tablet 0  . febuxostat (ULORIC) 40 MG tablet Take 3 tablets (120 mg total) by mouth daily. (Patient taking differently: Take 80 mg by mouth daily. ) 120 tablet 0  . furosemide (LASIX) 40 MG tablet Take 1 tablet (40 mg total) by mouth daily as needed for fluid. 30 tablet 0  . glucosamine-chondroitin 500-400 MG tablet Take 1 tablet by  mouth 3 (three) times daily.    . metoprolol tartrate (LOPRESSOR) 25 MG tablet Take 37.5 mg by mouth 2 (two) times daily.     [name] oxyCODONE-acetaminophen (PERCOCET) 5-325 MG per tablet Take 1-2 tablets by mouth every 4 (four) hours as needed. 30 tablet 0  . predniSONE (DELTASONE) 50 MG tablet 1 tablet daily for 4 days, one half tablet daily for 4 days (Patient taking differently: Take 50 mg by mouth daily with breakfast. 1 tablet daily for 4 days, one half tablet daily for 4 days) 6 tablet 1  . sildenafil (VIAGRA) 100 MG tablet Take 100 mg by mouth daily as needed for erectile dysfunction.    . sodium bicarbonate 650 MG tablet Take 650 mg by mouth 3 (three) times daily.    . traMADol (ULTRAM) 50 MG tablet Take 1 tablet (50 mg total) by mouth every 6 (six) hours  as needed. (Patient taking differently: Take 50 mg by mouth every 6 (six) hours as needed for moderate pain. ) 10 tablet 0   No current facility-administered medications for this visit.    Review of Systems  Constitutional: Negative for chills and fever.  Eyes: Negative for loss of vision.  Respiratory: Negative for cough and wheezing.  Cardiovascular: Positive for leg swelling. Negative for chest pain, chest tightness, claudication, dyspnea with exertion, orthopnea and palpitations.  GI: Negative for blood in stool and vomiting.  GU: Negative for dysuria and hematuria.  Musculoskeletal: Negative for leg pain, joint pain and myalgias.  Skin: Negative for rash and wound.  Neurological: Negative for dizziness and speech difficulty.  Hematologic: Negative for bruises/bleeds easily. Psychiatric: Negative for depressed mood.        Objective:  Objective  Filed Vitals:   02/08/14 1244  BP: 182/116  Pulse: 55  Height: 5\' 11"  (1.803 m)  Weight: 281 lb (127.461 kg)  SpO2: 100%   Body mass index is 39.21 kg/(m^2).  Physical Exam  Constitutional: He is oriented to person, place, and time. He appears well-developed and well-nourished.  HENT:  Head: Normocephalic and atraumatic.  Neck: Neck supple. No JVD present. No thyromegaly present.  Cardiovascular: Normal rate, regular rhythm and normal heart sounds.  Exam reveals no friction rub.   No murmur heard. Pulses:      Radial pulses are 2+ on the right side, and 2+ on the left side.  I do not detect carotid bruits.  Pulmonary/Chest: Breath sounds normal. He has no wheezes. He has no rales.  Abdominal: Soft. Bowel sounds are normal. There is no tenderness.  Musculoskeletal: Normal range of motion. He exhibits no edema.  Lymphadenopathy:    He has no cervical adenopathy.  Neurological: He is alert and oriented to person, place, and time. He has normal strength. No sensory deficit.  Skin: No lesion and no rash noted.  Psychiatric: He  has a normal mood and affect.    Data: I have independently interpreted his upper extremity vein mapping. He does not appear to have adequate cephalic vein in either arm. The basilic vein on the left appears to be reasonable in size although it empties into the brachial system early.  I have also independently interpreted his arterial Doppler study which shows that he has a high brachial artery bifurcation bilaterally. He does have triphasic Doppler signals in the radial and ulnar positions bilaterally.      Assessment/Plan:     Chronic kidney disease (CKD), stage IV (severe) It appears that his only option for a fistula would  be a basilic vein transposition on the left. If this is not adequate and we would place an AV graft. Given that he has a high bifurcation of his brachial artery he could potentially require an upper arm loop graft. I have explained the indications for placement of an AV fistula or AV graft. I've explained that if at all possible we will place an AV fistula.  I have reviewed the risks of placement of an AV fistula including but not limited to: failure of the fistula to mature, need for subsequent interventions, and thrombosis. In addition I have reviewed the potential complications of placement of an AV graft. These risks include, but are not limited to, graft thrombosis, graft infection, wound healing problems, bleeding, arm swelling, and steal syndrome. All the patient's questions were answered and they are agreeable to proceed with surgery. This surgery is tentatively scheduled for 02/23/2014.    Angelia Mould MD [facility]

## 2014-03-09 NOTE — Progress Notes (Signed)
Family called from waiting area, wife states she has complaint and is very upset that she did not get to speak with physician when procedure was completed and that no one notified her that her husband was finished with surgery.  Apologized to wife, notified MD in [place] and wife back to see pt in PACU.

## 2014-03-09 NOTE — Anesthesia Preprocedure Evaluation (Signed)
Anesthesia Evaluation  Patient identified by MRN, date of birth, ID band Patient awake    Reviewed: Allergy & Precautions, NPO status , Patient's Chart, lab work & pertinent test results  Airway        Dental   Pulmonary          Cardiovascular hypertension, + Peripheral Vascular Disease + dysrhythmias     Neuro/Psych  Neuromuscular disease    GI/Hepatic   Endo/Other  diabetes, Type 2Morbid obesity  Renal/GU CRF, ESRF and DialysisRenal disease     Musculoskeletal   Abdominal   Peds  Hematology  (+) anemia ,   Anesthesia Other Findings   Reproductive/Obstetrics                             Anesthesia Physical Anesthesia Plan  ASA: IV  Anesthesia Plan: MAC and General   Post-op Pain Management:    Induction: Intravenous  Airway Management Planned: Oral ETT  Additional Equipment:   Intra-op Plan:   Post-operative Plan: Extubation in OR  Informed Consent: I have reviewed the patients History and Physical, chart, labs and discussed the procedure including the risks, benefits and alternatives for the proposed anesthesia with the patient or authorized representative who has indicated his/her understanding and acceptance.     Plan Discussed with: CRNA, Anesthesiologist and Surgeon  Anesthesia Plan Comments:         Anesthesia Quick Evaluation

## 2014-03-09 NOTE — Transfer of Care (Signed)
Immediate Anesthesia Transfer of Care Note  Patient: Jack Irwin  Procedure(s) Performed: Procedure(s): INSERTION OF DIALYSIS CATHETER (Left) INSERTION OF ARTERIOVENOUS (AV) GORE-TEX GRAFT ARM (Left)  Patient Location: PACU  Anesthesia Type:MAC  Level of Consciousness: awake, alert , oriented and patient cooperative  Airway & Oxygen Therapy: Patient Spontanous Breathing  Post-op Assessment: Report given to RN, Post -op Vital signs reviewed and stable and Patient moving all extremities  Post vital signs: Reviewed and stable  Last Vitals:  Filed Vitals:   03/09/14 1224  BP:   Pulse:   Temp: 36.7 C  Resp:     Complications: No apparent anesthesia complications

## 2014-03-09 NOTE — Interval H&P Note (Signed)
History and Physical Interval Note:  03/09/2014 9:47 AM  Jack Irwin  has presented today for surgery, with the diagnosis of End Stage Renal Disease [id number] Mechanical Complication of Hemodialysis catheter T82.41D  The various methods of treatment have been discussed with the patient and family. After consideration of risks, benefits and other options for treatment, the patient has consented to  Procedure(s): BASILIC VEIN TRANSPOSITION VERSUS ARTERIOVENOUS GRAFT INSERTION (Left) INSERTION OF DIALYSIS CATHETER (N/A) as a surgical intervention .  The patient's history has been reviewed, patient examined, no change in status, stable for surgery.  I have reviewed the patient's chart and labs.  Questions were answered to the patient's satisfaction.     DICKSON,CHRISTOPHER S

## 2014-03-09 NOTE — Op Note (Signed)
NAME: Jack Irwin   MRN: [id number] DOB: October 09, 1965    DATE OF OPERATION: 03/09/2014  PREOP DIAGNOSIS: Stage V chronic kidney disease  POSTOP DIAGNOSIS: Same  PROCEDURE:  1. Ultrasound-guided placement of 27 cm tunneled dialysis catheter (L IJ) 2. Removal of right IJ tunneled dialysis catheter 3. Placement of left upper arm loop AV graft  SURGEON: Judeth Cornfield. Scot Dock, MD, FACS  ASSIST: Leontine Locket, PA  ANESTHESIA: local with sedation   EBL: minimal  INDICATIONS: Jack Irwin is a 49 y.o. male who presents for new access. I was also  Asked to change her tunneled dialysis catheter as her current catheter was not working well.  FINDINGS: the patient had a high bifurcation of the brachial artery and even the brachial artery at the axillary level was quite small. The high brachial vein was 4 mm.  TECHNIQUE:  The patient was taken to the operative room and sedated by anesthesia. The neck and upper chest were prepped and draped in usual sterile fashion. Under ultrasound guidance, after the skin was anesthetized, the left IJ was cannulated and a guidewire placed into the right atrium under fluoroscopic. The tract over the wire was dilated and then the dilator and peel-away sheath were advanced over the wire and the wire and dilator removed. A 27 cm catheter was passed through the peel-away sheath and positioned in the right atrium. The peel-away sheath was removed. The exit site for the cath was selected and the skin anesthetized between the 2 areas. The catheter was brought to the tunnel cut to the appropriate lengths and the distal ports were attached. Both ports with true easily within flushed with heparinized saline filled with concentrated heparin. The catheter was secured at its exit site with a 3-0 nylon suture. The IJ cannulation site was closed with a 40 separate stitch. Dermabond was applied.  Attention was then turned to removal of the right IJ catheter. After the  skin was anesthetized the cuff was dissected free bluntly and the catheter removed. Pressure was held for hemostasis over the right IJ. Dressing was applied.  The patient was then reprepped and draped with the left arm position for placement of a AV fistula or AV graft. I did look with the duplex scanner and the cephalic veins and basilic veins were not adequate. He had a high bifurcation of the brachial artery and therefore elected to place an upper arm loop graft. After the skin was anesthetized, a longitudinal incision was made beneath the axilla. Dissection was carried down to the brachial artery and brachial vein both of which were somewhat small. Using one incision distally after the skin was anesthetized, a 4-7 mm PTFE graft was tunneled in a loop fashion in the upper arm with the arterial aspect of the graft along the medial aspect of the upper arm. The venous aspect of the graft was laterally. The patient was heparinized. The brachial artery was clamped proximal and distally and a longitudinal arteriotomy was made. A short segment of the 4 mm and the graft was excised, graft slightly spatulated and sewn end to side to the artery using continuous 6-0 Prolene suture. Graft was then pulled to the appropriate point for anastomosis to the high brachial vein. The vein was ligated distally and spatulated proximally. The graft was cut to the appropriate length, spatulated, and sewn into into the high brachial vein using continuous 6-0 Prolene suture. At the completion was a palpable thrill in the graft. There was some spasm  in the brachial artery. It was a good radial and ulnar signal with the Doppler. Heparin was partially reversed with protamine. The counterincision was closed with a deep layer of 3-0 Vicryl and the skin closed with 4-0 Vicryl. Dermabond was applied. The axillary incision was closed with 2 deep layers of 3-0 Vicryl and the skin closed with 4-0 Vicryl. Dermabond was applied. Patient tolerated  the procedure well and transferred to the recovery room in stable condition. All needle and sponge counts were correct.  Deitra Mayo, MD, FACS [facility]  DATE OF DICTATION:   03/09/2014

## 2014-03-09 NOTE — Discharge Instructions (Signed)
03/09/2014 Jack Irwin [id number] 12-Dec-1965  Surgeon(s): Angelia Mould, MD  Procedure(s): INSERTION OF DIALYSIS CATHETER INSERTION OF ARTERIOVENOUS (AV) GORE-TEX GRAFT ARM-left upper arm  x Do not stick graft for 4 weeks   What to eat:  For your first meals, you should eat lightly; only small meals initially.  If you do not have nausea, you may eat larger meals.  Avoid spicy, greasy and heavy food.    General Anesthesia, Adult, Care After  Refer to this sheet in the next few weeks. These instructions provide you with information on caring for yourself after your procedure. Your health care provider may also give you more specific instructions. Your treatment has been planned according to current medical practices, but problems sometimes occur. Call your health care provider if you have any problems or questions after your procedure.  WHAT TO EXPECT AFTER THE PROCEDURE  After the procedure, it is typical to experience:  Sleepiness.  Nausea and vomiting. HOME CARE INSTRUCTIONS  For the first 24 hours after general anesthesia:  Have a responsible person with you.  Do not drive a car. If you are alone, do not take public transportation.  Do not drink alcohol.  Do not take medicine that has not been prescribed by your health care provider.  Do not sign important papers or make important decisions.  You may resume a normal diet and activities as directed by your health care provider.  Change bandages (dressings) as directed.  If you have questions or problems that seem related to general anesthesia, call the hospital and ask for the anesthetist or anesthesiologist on call. SEEK MEDICAL CARE IF:  You have nausea and vomiting that continue the day after anesthesia.  You develop a rash. SEEK IMMEDIATE MEDICAL CARE IF:  You have difficulty breathing.  You have chest pain.  You have any allergic problems. Document Released: 04/28/2000 Document Revised: 09/22/2012  Document Reviewed: 08/05/2012  [facility] Patient Information 2014 [place], [place].   Sore Throat    A sore throat is a painful, burning, sore, or scratchy feeling of the throat. There may be pain or tenderness when swallowing or talking. You may have other symptoms with a sore throat. These include coughing, sneezing, fever, or a swollen neck. A sore throat is often the first sign of another sickness. These sicknesses may include a cold, flu, strep throat, or an infection called mono. Most sore throats go away without medical treatment.  HOME CARE  Only take medicine as told by your doctor.  Drink enough fluids to keep your pee (urine) clear or pale yellow.  Rest as needed.  Try using throat sprays, lozenges, or suck on hard candy (if older than 4 years or as told).  Sip warm liquids, such as broth, herbal tea, or warm water with honey. Try sucking on frozen ice pops or drinking cold liquids.  Rinse the mouth (gargle) with salt water. Mix 1 teaspoon salt with 8 ounces of water.  Do not smoke. Avoid being around others when they are smoking.  Put a humidifier in your bedroom at night to moisten the air. You can also turn on a hot shower and sit in the bathroom for 5-10 minutes. Be sure the bathroom door is closed. GET HELP RIGHT AWAY IF:  You have trouble breathing.  You cannot swallow fluids, soft foods, or your spit (saliva).  You have more puffiness (swelling) in the throat.  Your sore throat does not get better in 7 days.  You  feel sick to your stomach (nauseous) and throw up (vomit).  You have a fever or lasting symptoms for more than 2-3 days.  You have a fever and your symptoms suddenly get worse. MAKE SURE YOU:  Understand these instructions.  Will watch your condition.  Will get help right away if you are not doing well or get worse. Document Released: 10/30/2007 Document Revised: 10/15/2011 Document Reviewed: 09/28/2011  [facility] Patient Information 2015 [place], [place]. This  information is not intended to replace advice given to you by your health care provider. Make sure you discuss any questions you have with your health care provider.

## 2014-03-13 ENCOUNTER — Encounter (HOSPITAL_COMMUNITY): Payer: Self-pay | Admitting: Vascular Surgery

## 2014-03-16 ENCOUNTER — Ambulatory Visit: Payer: Medicare Other

## 2014-03-20 ENCOUNTER — Encounter (HOSPITAL_COMMUNITY): Payer: Self-pay | Admitting: Emergency Medicine

## 2014-03-20 ENCOUNTER — Emergency Department (HOSPITAL_COMMUNITY)
Admission: EM | Admit: 2014-03-20 | Discharge: 2014-03-20 | Disposition: A | Discharge status: Home / Self Care | Payer: Medicare Other | Attending: Emergency Medicine | Admitting: Emergency Medicine

## 2014-03-20 DIAGNOSIS — M1990 Unspecified osteoarthritis, unspecified site: Secondary | ICD-10-CM | POA: Diagnosis not present

## 2014-03-20 DIAGNOSIS — N400 Enlarged prostate without lower urinary tract symptoms: Secondary | ICD-10-CM | POA: Diagnosis not present

## 2014-03-20 DIAGNOSIS — G8929 Other chronic pain: Secondary | ICD-10-CM | POA: Insufficient documentation

## 2014-03-20 DIAGNOSIS — E1165 Type 2 diabetes mellitus with hyperglycemia: Secondary | ICD-10-CM | POA: Diagnosis not present

## 2014-03-20 DIAGNOSIS — Z86718 Personal history of other venous thrombosis and embolism: Secondary | ICD-10-CM | POA: Insufficient documentation

## 2014-03-20 DIAGNOSIS — I129 Hypertensive chronic kidney disease with stage 1 through stage 4 chronic kidney disease, or unspecified chronic kidney disease: Secondary | ICD-10-CM | POA: Insufficient documentation

## 2014-03-20 DIAGNOSIS — M109 Gout, unspecified: Secondary | ICD-10-CM | POA: Insufficient documentation

## 2014-03-20 DIAGNOSIS — Z862 Personal history of diseases of the blood and blood-forming organs and certain disorders involving the immune mechanism: Secondary | ICD-10-CM | POA: Diagnosis not present

## 2014-03-20 DIAGNOSIS — Z792 Long term (current) use of antibiotics: Secondary | ICD-10-CM | POA: Diagnosis not present

## 2014-03-20 DIAGNOSIS — N184 Chronic kidney disease, stage 4 (severe): Secondary | ICD-10-CM | POA: Insufficient documentation

## 2014-03-20 DIAGNOSIS — Z79899 Other long term (current) drug therapy: Secondary | ICD-10-CM | POA: Diagnosis not present

## 2014-03-20 DIAGNOSIS — Z872 Personal history of diseases of the skin and subcutaneous tissue: Secondary | ICD-10-CM | POA: Insufficient documentation

## 2014-03-20 DIAGNOSIS — R42 Dizziness and giddiness: Secondary | ICD-10-CM

## 2014-03-20 LAB — COMPREHENSIVE METABOLIC PANEL
ALBUMIN: 3.1 g/dL — AB (ref 3.5–5.2)
ALT: 13 U/L (ref 0–53)
AST: 30 U/L (ref 0–37)
Alkaline Phosphatase: 54 U/L (ref 39–117)
Anion gap: 9 (ref 5–15)
BUN: 83 mg/dL — ABNORMAL HIGH (ref 6–23)
CALCIUM: 8.5 mg/dL (ref 8.4–10.5)
CO2: 27 mmol/L (ref 19–32)
Chloride: 102 mmol/L (ref 96–112)
Creatinine, Ser: 9.32 mg/dL — ABNORMAL HIGH (ref 0.50–1.35)
GFR calc non Af Amer: 6 mL/min — ABNORMAL LOW (ref >90)
GFR, EST AFRICAN AMERICAN: 7 mL/min — AB (ref >90)
GLUCOSE: 80 mg/dL (ref 70–99)
Potassium: 4.1 mmol/L (ref 3.5–5.1)
SODIUM: 138 mmol/L (ref 135–145)
TOTAL PROTEIN: 7.7 g/dL (ref 6.0–8.3)
Total Bilirubin: 0.7 mg/dL (ref 0.3–1.2)

## 2014-03-20 LAB — CBC WITH DIFFERENTIAL/PLATELET
Basophils Absolute: 0 10*3/uL (ref 0.0–0.1)
Basophils Relative: 0 % (ref 0–1)
Eosinophils Absolute: 0.1 10*3/uL (ref 0.0–0.7)
Eosinophils Relative: 1 % (ref 0–5)
HCT: 26.9 % — ABNORMAL LOW (ref 39.0–52.0)
HEMOGLOBIN: 8.4 g/dL — AB (ref 13.0–17.0)
Lymphocytes Relative: 22 % (ref 12–46)
Lymphs Abs: 2.5 10*3/uL (ref 0.7–4.0)
MCH: 27.5 pg (ref 26.0–34.0)
MCHC: 31.2 g/dL (ref 30.0–36.0)
MCV: 88.2 fL (ref 78.0–100.0)
Monocytes Absolute: 0.7 10*3/uL (ref 0.1–1.0)
Monocytes Relative: 6 % (ref 3–12)
NEUTROS ABS: 8 10*3/uL — AB (ref 1.7–7.7)
Neutrophils Relative %: 71 % (ref 43–77)
Platelets: 236 10*3/uL (ref 150–400)
RBC: 3.05 MIL/uL — ABNORMAL LOW (ref 4.22–5.81)
RDW: 15.4 % (ref 11.5–15.5)
WBC: 11.3 10*3/uL — ABNORMAL HIGH (ref 4.0–10.5)

## 2014-03-20 LAB — URINALYSIS, ROUTINE W REFLEX MICROSCOPIC
Bilirubin Urine: NEGATIVE
GLUCOSE, UA: NEGATIVE mg/dL
Ketones, ur: NEGATIVE mg/dL
Leukocytes, UA: NEGATIVE
Nitrite: NEGATIVE
Protein, ur: 100 mg/dL — AB
SPECIFIC GRAVITY, URINE: 1.025 (ref 1.005–1.030)
UROBILINOGEN UA: 0.2 mg/dL (ref 0.0–1.0)
pH: 5.5 (ref 5.0–8.0)

## 2014-03-20 LAB — URINE MICROSCOPIC-ADD ON

## 2014-03-20 LAB — POC OCCULT BLOOD, ED: Fecal Occult Bld: NEGATIVE

## 2014-03-20 LAB — TROPONIN I: Troponin I: <0.03 ng/mL (ref <0.031)

## 2014-03-20 LAB — CBG MONITORING, ED: GLUCOSE-CAPILLARY: 66 mg/dL — AB (ref 70–99)

## 2014-03-20 NOTE — ED Notes (Signed)
Pt states that he has been very thirsty and his sugar is high.  Has been dizzy.  States he does not know how he is going to get to dialysis tomorrow and was hoping we could help out or do it here.

## 2014-03-20 NOTE — Discharge Instructions (Signed)

## 2014-03-20 NOTE — ED Provider Notes (Signed)
CSN: [id number]     Arrival date & time 03/20/14  1726 History   First MD Initiated Contact with Patient 03/20/14 1727     Chief Complaint  Patient presents with  . Dizziness  . Hyperglycemia     (Consider location/radiation/quality/duration/timing/severity/associated sxs/prior Treatment) HPI Comments: Patient presents to the ER for evaluation of dizziness. Patient reports that he has been feeling dizzy all day today. He has not had any fever, nausea, vomiting, diarrhea. There is no headache or blurred vision. Patient denies chest pain and shortness of breath. Patient was found to be hyperglycemic but EMS. Patient reports that he has been a "borderline diabetic, which has been managed by diet. Blood sugar was over 400 for EMS today.  Patient is a 49 y.o. male presenting with dizziness and hyperglycemia.  Dizziness Hyperglycemia Associated symptoms: dizziness     Past Medical History  Diagnosis Date  . Hypertension   . Diabetes mellitus   . Gout   . Chronic back pain   . BPH (benign prostatic hyperplasia)   . Anemia   . DVT (deep venous thrombosis)   . Neuropathy   . Decubitus ulcer     of 2nd toes of both feet.  . Edema leg   . Constipation   . Venous (peripheral) insufficiency   . Neuropathy, diabetic   . Physical deconditioning   . Poor balance   . Difficulty walking   . Lack of coordination   . Arthritis   . Renal insufficiency   . Chronic kidney disease   . Chronic kidney disease (CKD), stage IV (severe)   . Constipation    Past Surgical History  Procedure Laterality Date  . None    . Insertion of dialysis catheter    . Cyst excision Right     cyst removed on thumb 2001  . Insertion of dialysis catheter Left 03/09/2014    Procedure: INSERTION OF DIALYSIS CATHETER;  Surgeon: Angelia Mould, MD;  Location: [place];  Service: Vascular;  Laterality: Left;  . Av fistula placement Left 03/09/2014    Procedure: INSERTION OF ARTERIOVENOUS (AV) GORE-TEX GRAFT ARM;   Surgeon: Angelia Mould, MD;  Location: [facility];  Service: Vascular;  Laterality: Left;   Family History  Problem Relation Age of Onset  . Diabetes Mother   . Hypertension Mother   . Heart failure Mother   . Hyperlipidemia Mother   . Heart attack Mother   . Cancer Father   . Diabetes Father   . Hypertension Father   . Hyperlipidemia Father    History  Substance Use Topics  . Smoking status: Never Smoker   . Smokeless tobacco: Never Used  . Alcohol Use: No    Review of Systems  Neurological: Positive for dizziness.  All other systems reviewed and are negative.     Allergies  Review of patient's allergies indicates no known allergies.  Home Medications   Prior to Admission medications   Medication Sig Start Date End Date Taking? Authorizing Provider  Cholecalciferol (VITAMIN D3) 1000 UNITS CAPS Take 1 capsule by mouth daily.   Yes Historical Provider, MD  COD LIVER OIL PO Take 1 capsule by mouth daily.   Yes Historical Provider, MD  colchicine (COLCRYS) 0.6 MG tablet Take 1 tablet (0.6 mg total) by mouth daily. Patient taking differently: Take 0.6 mg by mouth daily as needed. For gout 10/04/13  Yes [name], MD  febuxostat (ULORIC) 40 MG tablet Take 3 tablets (120 mg total) by  mouth daily. Patient taking differently: Take 80 mg by mouth daily.  10/04/13  Yes [name], MD  metoprolol tartrate (LOPRESSOR) 25 MG tablet Take 37.5 mg by mouth 2 (two) times daily.  11/22/13  Yes Historical Provider, MD  sodium bicarbonate 650 MG tablet Take 650 mg by mouth 3 (three) times daily.   Yes Historical Provider, MD  traMADol (ULTRAM) 50 MG tablet Take 1 tablet (50 mg total) by mouth every 6 (six) hours as needed. Patient taking differently: Take 50 mg by mouth every 6 (six) hours as needed for moderate pain.  12/13/13  Yes Glendell Docker, NP  ondansetron (ZOFRAN ODT) 4 MG disintegrating tablet 4mg  ODT q4 hours prn nausea/vomit Patient not taking: Reported on 03/20/2014 02/16/14    Maudry Diego, MD  oxyCODONE-acetaminophen (PERCOCET) 5-325 MG per tablet Take 1-2 tablets by mouth every 4 (four) hours as needed for moderate pain. Patient not taking: Reported on 03/20/2014 03/09/14   Hulen Shouts Rhyne, PA-C  penicillin v potassium (VEETID) 500 MG tablet Take 1 tablet (500 mg total) by mouth 3 (three) times daily. Patient not taking: Reported on 03/20/2014 03/03/14   Fredia Sorrow, MD   BP 91/53 mmHg  Pulse 82  Temp(Src) 98.4 F (36.9 C) (Oral)  Resp 20  Ht 5\' 11"  (1.803 m)  Wt 264 lb (119.75 kg)  BMI 36.84 kg/m2  SpO2 100% Physical Exam  Constitutional: He is oriented to person, place, and time. He appears well-developed and well-nourished. No distress.  HENT:  Head: Normocephalic and atraumatic.  Right Ear: Hearing normal.  Left Ear: Hearing normal.  Nose: Nose normal.  Mouth/Throat: Oropharynx is clear and moist and mucous membranes are normal.  Eyes: Conjunctivae and EOM are normal. Pupils are equal, round, and reactive to light.  Neck: Normal range of motion. Neck supple.  Cardiovascular: Regular rhythm, S1 normal and S2 normal.  Exam reveals no gallop and no friction rub.   No murmur heard. Pulmonary/Chest: Effort normal and breath sounds normal. No respiratory distress. He exhibits no tenderness.  Abdominal: Soft. Normal appearance and bowel sounds are normal. There is no hepatosplenomegaly. There is no tenderness. There is no rebound, no guarding, no tenderness at McBurney's point and negative Murphy's sign. No hernia.  Musculoskeletal: Normal range of motion.  Neurological: He is alert and oriented to person, place, and time. He has normal strength. No cranial nerve deficit or sensory deficit. Coordination normal. GCS eye subscore is 4. GCS verbal subscore is 5. GCS motor subscore is 6.  Skin: Skin is warm, dry and intact. No rash noted. No cyanosis.  Psychiatric: He has a normal mood and affect. His speech is normal and behavior is normal. Thought content  normal.  Nursing note and vitals reviewed.   ED Course  Procedures (including critical care time) Labs Review Labs Reviewed  CBC WITH DIFFERENTIAL/PLATELET - Abnormal; Notable for the following:    WBC 11.3 (*)    RBC 3.05 (*)    Hemoglobin 8.4 (*)    HCT 26.9 (*)    Neutro Abs 8.0 (*)    All other components within normal limits  COMPREHENSIVE METABOLIC PANEL - Abnormal; Notable for the following:    BUN 83 (*)    Creatinine, Ser 9.32 (*)    Albumin 3.1 (*)    GFR calc non Af Amer 6 (*)    GFR calc Af Amer 7 (*)    All other components within normal limits  URINALYSIS, ROUTINE W REFLEX MICROSCOPIC - Abnormal;  Notable for the following:    Hgb urine dipstick MODERATE (*)    Protein, ur 100 (*)    All other components within normal limits  URINE MICROSCOPIC-ADD ON - Abnormal; Notable for the following:    Bacteria, UA FEW (*)    Casts GRANULAR CAST (*)    All other components within normal limits  CBG MONITORING, ED - Abnormal; Notable for the following:    Glucose-Capillary 66 (*)    All other components within normal limits  TROPONIN I  POC OCCULT BLOOD, ED    Imaging Review No results found.   EKG Interpretation   Date/Time:  Monday March 20 2014 17:36:16 EST Ventricular Rate:  70 PR Interval:  122 QRS Duration: 131 QT Interval:  441 QTC Calculation: 476 R Axis:   52 Text Interpretation:  Sinus rhythm Right bundle branch block Baseline  wander in lead(s) V1 No significant change since last tracing Confirmed by  POLLINA  MD, Anna Maria [phone]) on 03/20/2014 6:04:42 PM      MDM   Final diagnoses:  None   dizziness  Patient presents to the ER for evaluation of dizziness. Patient reports that he has been feeling dizzy all day. Patient reportedly had a significantly elevated because for EMS. His labs here, however, do not indicate any hyperglycemia.  Patient's lab work does reveal evidence of anemia. Patient just started dialysis 3 weeks ago. His  hemoglobin has dropped from the 12 range to 8.4, but he has not had any bleeding. Rectal exam was heme-negative. This is likely dialysis affect.  Remainder of the patient's workup has been unremarkable. Electrolytes are unremarkable, no evidence of hyperkalemia. Cardiac evaluation negative. Patient appropriate for discharge, follow-up dialysis.    Orpah Greek, MD 03/20/14 [phone]

## 2014-04-02 DIAGNOSIS — I420 Dilated cardiomyopathy: Secondary | ICD-10-CM | POA: Insufficient documentation

## 2014-04-02 DIAGNOSIS — Z992 Dependence on renal dialysis: Secondary | ICD-10-CM | POA: Insufficient documentation

## 2014-04-02 DIAGNOSIS — M9080 Osteopathy in diseases classified elsewhere, unspecified site: Secondary | ICD-10-CM | POA: Insufficient documentation

## 2014-04-04 ENCOUNTER — Emergency Department (HOSPITAL_COMMUNITY)
Admission: EM | Admit: 2014-04-04 | Discharge: 2014-04-04 | Disposition: A | Discharge status: Home / Self Care | Payer: Medicare Other | Attending: Emergency Medicine | Admitting: Emergency Medicine

## 2014-04-04 ENCOUNTER — Encounter (HOSPITAL_COMMUNITY): Payer: Self-pay

## 2014-04-04 DIAGNOSIS — Z8719 Personal history of other diseases of the digestive system: Secondary | ICD-10-CM | POA: Diagnosis not present

## 2014-04-04 DIAGNOSIS — M109 Gout, unspecified: Secondary | ICD-10-CM | POA: Diagnosis not present

## 2014-04-04 DIAGNOSIS — G4489 Other headache syndrome: Secondary | ICD-10-CM | POA: Diagnosis not present

## 2014-04-04 DIAGNOSIS — Z87438 Personal history of other diseases of male genital organs: Secondary | ICD-10-CM | POA: Insufficient documentation

## 2014-04-04 DIAGNOSIS — M1990 Unspecified osteoarthritis, unspecified site: Secondary | ICD-10-CM | POA: Insufficient documentation

## 2014-04-04 DIAGNOSIS — I120 Hypertensive chronic kidney disease with stage 5 chronic kidney disease or end stage renal disease: Secondary | ICD-10-CM | POA: Insufficient documentation

## 2014-04-04 DIAGNOSIS — Z992 Dependence on renal dialysis: Secondary | ICD-10-CM | POA: Diagnosis not present

## 2014-04-04 DIAGNOSIS — Z86718 Personal history of other venous thrombosis and embolism: Secondary | ICD-10-CM | POA: Insufficient documentation

## 2014-04-04 DIAGNOSIS — G8929 Other chronic pain: Secondary | ICD-10-CM | POA: Insufficient documentation

## 2014-04-04 DIAGNOSIS — R42 Dizziness and giddiness: Secondary | ICD-10-CM

## 2014-04-04 DIAGNOSIS — R51 Headache: Secondary | ICD-10-CM | POA: Diagnosis present

## 2014-04-04 DIAGNOSIS — Z862 Personal history of diseases of the blood and blood-forming organs and certain disorders involving the immune mechanism: Secondary | ICD-10-CM | POA: Insufficient documentation

## 2014-04-04 DIAGNOSIS — N186 End stage renal disease: Secondary | ICD-10-CM | POA: Insufficient documentation

## 2014-04-04 DIAGNOSIS — Z792 Long term (current) use of antibiotics: Secondary | ICD-10-CM | POA: Insufficient documentation

## 2014-04-04 DIAGNOSIS — Z79899 Other long term (current) drug therapy: Secondary | ICD-10-CM | POA: Diagnosis not present

## 2014-04-04 DIAGNOSIS — Z872 Personal history of diseases of the skin and subcutaneous tissue: Secondary | ICD-10-CM | POA: Insufficient documentation

## 2014-04-04 DIAGNOSIS — E1140 Type 2 diabetes mellitus with diabetic neuropathy, unspecified: Secondary | ICD-10-CM | POA: Insufficient documentation

## 2014-04-04 LAB — BASIC METABOLIC PANEL
BUN: 28 mg/dL — ABNORMAL HIGH (ref 6–23)
CHLORIDE: 109 mmol/L (ref 96–112)
CO2: 29 mmol/L (ref 19–32)
Calcium: 8.4 mg/dL (ref 8.4–10.5)
Creatinine, Ser: 6.34 mg/dL — ABNORMAL HIGH (ref 0.50–1.35)
GFR calc non Af Amer: 9 mL/min — ABNORMAL LOW (ref >90)
GFR, EST AFRICAN AMERICAN: 11 mL/min — AB (ref >90)
GLUCOSE: 99 mg/dL (ref 70–99)
POTASSIUM: 3.9 mmol/L (ref 3.5–5.1)
SODIUM: 138 mmol/L (ref 135–145)

## 2014-04-04 LAB — CBC WITH DIFFERENTIAL/PLATELET
Basophils Absolute: 0 10*3/uL (ref 0.0–0.1)
Basophils Relative: 0 % (ref 0–1)
Eosinophils Absolute: 0.2 10*3/uL (ref 0.0–0.7)
Eosinophils Relative: 3 % (ref 0–5)
HCT: 30.9 % — ABNORMAL LOW (ref 39.0–52.0)
HEMOGLOBIN: 9.1 g/dL — AB (ref 13.0–17.0)
LYMPHS ABS: 1.5 10*3/uL (ref 0.7–4.0)
LYMPHS PCT: 23 % (ref 12–46)
MCH: 27.3 pg (ref 26.0–34.0)
MCHC: 29.4 g/dL — ABNORMAL LOW (ref 30.0–36.0)
MCV: 92.8 fL (ref 78.0–100.0)
Monocytes Absolute: 0.5 10*3/uL (ref 0.1–1.0)
Monocytes Relative: 7 % (ref 3–12)
NEUTROS PCT: 67 % (ref 43–77)
Neutro Abs: 4.5 10*3/uL (ref 1.7–7.7)
PLATELETS: 136 10*3/uL — AB (ref 150–400)
RBC: 3.33 MIL/uL — AB (ref 4.22–5.81)
RDW: 17.1 % — ABNORMAL HIGH (ref 11.5–15.5)
WBC: 6.7 10*3/uL (ref 4.0–10.5)

## 2014-04-04 LAB — CBG MONITORING, ED: GLUCOSE-CAPILLARY: 115 mg/dL — AB (ref 70–99)

## 2014-04-04 LAB — TROPONIN I: Troponin I: <0.03 ng/mL (ref <0.031)

## 2014-04-04 MED ORDER — ACETAMINOPHEN 325 MG PO TABS
650.0000 mg | ORAL_TABLET | Freq: Once | ORAL | Status: AC
  Administered 2014-04-04: 650 mg via ORAL
  Filled 2014-04-04: qty 2

## 2014-04-04 NOTE — ED Notes (Signed)
Pt c/o dizziness and headache x 2 days.  Reports has dialysis 3 days/week.  Last dialysis was yesterday.

## 2014-04-04 NOTE — ED Provider Notes (Signed)
CSN: [id number]     Arrival date & time 04/04/14  [id number] History  This chart was scribe for No att. providers found by Judithann Sauger, ED Scribe. The patient was seen in room APA06/APA06 and the patient's care was started at 11:05 AM.    Chief Complaint  Patient presents with  . Dizziness  . Headache   The history is provided by the patient. No language interpreter was used.   HPI Comments: Jack Irwin is a 49 y.o. male who presents to the Emergency Department complaining of HA and dizziness onset 2 days ago. He explains that he has dialysis 3 days a week and his last one was yesterday. He denies eating this morning. He denies coughing, fever, abdominal pain. He denies that he smokes.   Past Medical History  Diagnosis Date  . Hypertension   . Diabetes mellitus   . Gout   . Chronic back pain   . BPH (benign prostatic hyperplasia)   . Anemia   . DVT (deep venous thrombosis)   . Neuropathy   . Decubitus ulcer     of 2nd toes of both feet.  . Edema leg   . Constipation   . Venous (peripheral) insufficiency   . Neuropathy, diabetic   . Physical deconditioning   . Poor balance   . Difficulty walking   . Lack of coordination   . Arthritis   . Renal insufficiency   . Chronic kidney disease   . Chronic kidney disease (CKD), stage IV (severe)   . Constipation    Past Surgical History  Procedure Laterality Date  . None    . Insertion of dialysis catheter    . Cyst excision Right     cyst removed on thumb 2001  . Insertion of dialysis catheter Left 03/09/2014    Procedure: INSERTION OF DIALYSIS CATHETER;  Surgeon: Angelia Mould, MD;  Location: [facility];  Service: Vascular;  Laterality: Left;  . Av fistula placement Left 03/09/2014    Procedure: INSERTION OF ARTERIOVENOUS (AV) GORE-TEX GRAFT ARM;  Surgeon: Angelia Mould, MD;  Location: [facility] OR;  Service: Vascular;  Laterality: Left;   Family History  Problem Relation Age of Onset  . Diabetes Mother   .  Hypertension Mother   . Heart failure Mother   . Hyperlipidemia Mother   . Heart attack Mother   . Cancer Father   . Diabetes Father   . Hypertension Father   . Hyperlipidemia Father    History  Substance Use Topics  . Smoking status: Never Smoker   . Smokeless tobacco: Never Used  . Alcohol Use: No    Review of Systems  Constitutional: Negative for fever.  Respiratory: Negative for cough.   Gastrointestinal: Negative for abdominal pain.  Neurological: Positive for dizziness and headaches.  All other systems reviewed and are negative.     Allergies  Review of patient's allergies indicates no known allergies.  Home Medications   Prior to Admission medications   Medication Sig Start Date End Date Taking? Authorizing Provider  febuxostat (ULORIC) 40 MG tablet Take 3 tablets (120 mg total) by mouth daily. Patient taking differently: Take 80 mg by mouth daily.  10/04/13  Yes [name], MD  sildenafil (VIAGRA) 50 MG tablet Take 50 mg by mouth daily as needed for erectile dysfunction.   Yes Historical Provider, MD  colchicine (COLCRYS) 0.6 MG tablet Take 1 tablet (0.6 mg total) by mouth daily. Patient not taking: Reported on 04/04/2014 10/04/13  [name], MD  ondansetron (ZOFRAN ODT) 4 MG disintegrating tablet 4mg  ODT q4 hours prn nausea/vomit Patient not taking: Reported on 03/20/2014 02/16/14   Maudry Diego, MD  oxyCODONE-acetaminophen (PERCOCET) 5-325 MG per tablet Take 1-2 tablets by mouth every 4 (four) hours as needed for moderate pain. Patient not taking: Reported on 03/20/2014 03/09/14   Hulen Shouts Rhyne, PA-C  penicillin v potassium (VEETID) 500 MG tablet Take 1 tablet (500 mg total) by mouth 3 (three) times daily. Patient not taking: Reported on 03/20/2014 03/03/14   Fredia Sorrow, MD  traMADol (ULTRAM) 50 MG tablet Take 1 tablet (50 mg total) by mouth every 6 (six) hours as needed. Patient not taking: Reported on 04/04/2014 12/13/13   Glendell Docker, NP   BP 122/70  mmHg  Pulse 88  Temp(Src) 98.2 F (36.8 C) (Oral)  Resp 12  Ht 5\' 11"  (1.803 m)  Wt 259 lb (117.482 kg)  BMI 36.14 kg/m2  SpO2 100% Physical Exam  Constitutional: He is oriented to person, place, and time. He appears well-developed and well-nourished.  HENT:  Head: Normocephalic and atraumatic.  Right Ear: External ear normal.  Left Ear: External ear normal.  Eyes: Conjunctivae and EOM are normal. Pupils are equal, round, and reactive to light.  Neck: Normal range of motion and phonation normal. Neck supple.  Cardiovascular: Normal rate, regular rhythm and normal heart sounds.   Fistula on RU arm, no TTP  Pulmonary/Chest: Effort normal and breath sounds normal. He exhibits no bony tenderness.  Abdominal: Soft. There is no tenderness.  Musculoskeletal: Normal range of motion.  Normal ROM of neck Mild tenderness bilaterally  Neurological: He is alert and oriented to person, place, and time. No cranial nerve deficit or sensory deficit. He exhibits normal muscle tone. Coordination normal.  No ataxia   Skin: Skin is warm, dry and intact.  Psychiatric: He has a normal mood and affect. His behavior is normal. Judgment and thought content normal.  Nursing note and vitals reviewed.   ED Course  Procedures (including critical care time) DIAGNOSTIC STUDIES: Oxygen Saturation is 100% on RA, normal by my interpretation.    COORDINATION OF CARE: Medications  acetaminophen (TYLENOL) tablet 650 mg (650 mg Oral Given 04/04/14 1120)    Patient Vitals for the past 24 hrs:  BP Temp Temp src Pulse Resp SpO2 Height Weight  04/04/14 1159 - 98.2 F (36.8 C) Oral - - - - -  04/04/14 1150 122/70 mmHg - - 88 12 100 % - -  04/04/14 1054 122/83 mmHg 98.3 F (36.8 C) Oral 93 18 100 % - -  04/04/14 0949 117/75 mmHg 98.7 F (37.1 C) Oral 115 16 97 % - -  04/04/14 0944 - - - - - - 5\' 11"  (1.803 m) 259 lb (117.482 kg)   11:31 AM- Pt advised of plan for treatment and pt agrees.     Labs Review Labs  Reviewed  CBC WITH DIFFERENTIAL/PLATELET - Abnormal; Notable for the following:    RBC 3.33 (*)    Hemoglobin 9.1 (*)    HCT 30.9 (*)    MCHC 29.4 (*)    RDW 17.1 (*)    Platelets 136 (*)    All other components within normal limits  BASIC METABOLIC PANEL - Abnormal; Notable for the following:    BUN 28 (*)    Creatinine, Ser 6.34 (*)    GFR calc non Af Amer 9 (*)    GFR calc Af Amer 11 (*)  All other components within normal limits  CBG MONITORING, ED - Abnormal; Notable for the following:    Glucose-Capillary 115 (*)    All other components within normal limits  TROPONIN I    Imaging Review No results found.   EKG Interpretation   Date/Time:  Tuesday April 04 2014 09:48:47 EST Ventricular Rate:  113 PR Interval:  129 QRS Duration: 126 QT Interval:  340 QTC Calculation: 466 R Axis:   86 Text Interpretation:  Sinus tachycardia Right bundle branch block Baseline  wander in lead(s) V2 Since last tracing rate faster Confirmed by Enio Hornback   MD, Tashai Catino [id number]) on 04/04/2014 10:50:33 AM      MDM   Final diagnoses:  Dizziness  Other headache syndrome     Nonspecific dizziness and headache. Vision dialyze yesterday. Potassium is normal. Creatinine is mildly elevated. Patient tolerating fluids and food in the emergency department .There is no evidence for serious bacterial infection, metabolic instability or hemodynamic instability.  Nursing Notes Reviewed/ Care Coordinated Applicable Imaging Reviewed Interpretation of Laboratory Data incorporated into ED treatment  The patient appears reasonably screened and/or stabilized for discharge and I doubt any other medical condition or other [facility] requiring further screening, evaluation, or treatment in the ED at this time prior to discharge.  Plan: Home Medications- usual; Home Treatments- rest; return here if the recommended treatment, does not improve the symptoms; Recommended follow up- PCP prn. Dialysis tomorrow   I  personally performed the services described in this documentation, which was scribed in my presence. The recorded information has been reviewed and is accurate.      Richarda Blade, MD 04/04/14 [phone]

## 2014-04-04 NOTE — Discharge Instructions (Signed)
Use Tylenol as needed for headache. Drink plenty of fluids. Use the resource guide to help you find a new primary care doctor.   Dizziness Dizziness is a common problem. It is a feeling of unsteadiness or light-headedness. You may feel like you are about to faint. Dizziness can lead to injury if you stumble or fall. A person of any age group can suffer from dizziness, but dizziness is more common in older adults. CAUSES  Dizziness can be caused by many different things, including:  Middle ear problems.  Standing for too long.  Infections.  An allergic reaction.  Aging.  An emotional response to something, such as the sight of blood.  Side effects of medicines.  Tiredness.  Problems with circulation or blood pressure.  Excessive use of alcohol or medicines, or illegal drug use.  Breathing too fast (hyperventilation).  An irregular heart rhythm (arrhythmia).  A low red blood cell count (anemia).  Pregnancy.  Vomiting, diarrhea, fever, or other illnesses that cause body fluid loss (dehydration).  Diseases or conditions such as Parkinson's disease, high blood pressure (hypertension), diabetes, and thyroid problems.  Exposure to extreme heat. DIAGNOSIS  Your health care provider will ask about your symptoms, perform a physical exam, and perform an electrocardiogram (ECG) to record the electrical activity of your heart. Your health care provider may also perform other heart or blood tests to determine the cause of your dizziness. These may include:  Transthoracic echocardiogram (TTE). During echocardiography, sound waves are used to evaluate how blood flows through your heart.  Transesophageal echocardiogram (TEE).  Cardiac monitoring. This allows your health care provider to monitor your heart rate and rhythm in real time.  Holter monitor. This is a portable device that records your heartbeat and can help diagnose heart arrhythmias. It allows your health care provider  to track your heart activity for several days if needed.  Stress tests by exercise or by giving medicine that makes the heart beat faster. TREATMENT  Treatment of dizziness depends on the cause of your symptoms and can vary greatly. HOME CARE INSTRUCTIONS   Drink enough fluids to keep your urine clear or pale yellow. This is especially important in very hot weather. In older adults, it is also important in cold weather.  Take your medicine exactly as directed if your dizziness is caused by medicines. When taking blood pressure medicines, it is especially important to get up slowly.  Rise slowly from chairs and steady yourself until you feel okay.  In the morning, first sit up on the side of the bed. When you feel okay, stand slowly while holding onto something until you know your balance is fine.  Move your legs often if you need to stand in one place for a long time. Tighten and relax your muscles in your legs while standing.  Have someone stay with you for 1-2 days if dizziness continues to be a problem. Do this until you feel you are well enough to stay alone. Have the person call your health care provider if he or she notices changes in you that are concerning.  Do not drive or use heavy machinery if you feel dizzy.  Do not drink alcohol. SEEK IMMEDIATE MEDICAL CARE IF:   Your dizziness or light-headedness gets worse.  You feel nauseous or vomit.  You have problems talking, walking, or using your arms, hands, or legs.  You feel weak.  You are not thinking clearly or you have trouble forming sentences. It may take a friend  or family member to notice this.  You have chest pain, abdominal pain, shortness of breath, or sweating.  Your vision changes.  You notice any bleeding.  You have side effects from medicine that seems to be getting worse rather than better. MAKE SURE YOU:   Understand these instructions.  Will watch your condition.  Will get help right away if you  are not doing well or get worse. Document Released: 07/16/2000 Document Revised: 01/25/2013 Document Reviewed: 08/09/2010 [facility] Patient Information 2015 [place], [place]. This information is not intended to replace advice given to you by your health care provider. Make sure you discuss any questions you have with your health care provider.  General Headache Without Cause A headache is pain or discomfort felt around the head or neck area. The specific cause of a headache may not be found. There are many causes and types of headaches. A few common ones are:  Tension headaches.  Migraine headaches.  Cluster headaches.  Chronic daily headaches. HOME CARE INSTRUCTIONS   Keep all follow-up appointments with your caregiver or any specialist referral.  Only take over-the-counter or prescription medicines for pain or discomfort as directed by your caregiver.  Lie down in a dark, quiet room when you have a headache.  Keep a headache journal to find out what may trigger your migraine headaches. For example, write down:  What you eat and drink.  How much sleep you get.  Any change to your diet or medicines.  Try massage or other relaxation techniques.  Put ice packs or heat on the head and neck. Use these 3 to 4 times per day for 15 to 20 minutes each time, or as needed.  Limit stress.  Sit up straight, and do not tense your muscles.  Quit smoking if you smoke.  Limit alcohol use.  Decrease the amount of caffeine you drink, or stop drinking caffeine.  Eat and sleep on a regular schedule.  Get 7 to 9 hours of sleep, or as recommended by your caregiver.  Keep lights dim if bright lights bother you and make your headaches worse. SEEK MEDICAL CARE IF:   You have problems with the medicines you were prescribed.  Your medicines are not working.  You have a change from the usual headache.  You have nausea or vomiting. SEEK IMMEDIATE MEDICAL CARE IF:   Your headache becomes  severe.  You have a fever.  You have a stiff neck.  You have loss of vision.  You have muscular weakness or loss of muscle control.  You start losing your balance or have trouble walking.  You feel faint or pass out.  You have severe symptoms that are different from your first symptoms. MAKE SURE YOU:   Understand these instructions.  Will watch your condition.  Will get help right away if you are not doing well or get worse. Document Released: 01/20/2005 Document Revised: 04/14/2011 Document Reviewed: 02/05/2011 [facility] Patient Information 2015 [place], [place]. This information is not intended to replace advice given to you by your health care provider. Make sure you discuss any questions you have with your health care provider.     Emergency Department Resource Guide 1) Find a Doctor and Pay Out of Pocket Although you won't have to find out who is covered by your insurance plan, it is a good idea to ask around and get recommendations. You will then need to call the office and see if the doctor you have chosen will accept you as a new  patient and what types of options they offer for patients who are self-pay. Some doctors offer discounts or will set up payment plans for their patients who do not have insurance, but you will need to ask so you aren't surprised when you get to your appointment.  2) Contact Your Local Health Department Not all health departments have doctors that can see patients for sick visits, but many do, so it is worth a call to see if yours does. If you don't know where your local health department is, you can check in your phone book. The CDC also has a tool to help you locate your state's health department, and many state websites also have listings of all of their local health departments.  3) Find a [facility] If your illness is not likely to be very severe or complicated, you may want to try a walk in clinic. These are popping up all over the country  in pharmacies, drugstores, and shopping centers. They're usually staffed by nurse practitioners or physician assistants that have been trained to treat common illnesses and complaints. They're usually fairly quick and inexpensive. However, if you have serious medical issues or chronic medical problems, these are probably not your best option.  No Primary Care Doctor: - Call Health Connect at  [phone] - they can help you locate a primary care doctor that  accepts your insurance, provides certain services, etc. - Physician Referral Service- [phone]  Chronic Pain Problems: Organization         Address  Phone   Notes  [facility]  [phone] Patients need to be referred by their primary care doctor.   Medication Assistance: Organization         Address  Phone   Notes  [facility] Medication [facility] [address] [place], [place] [address] [phone] --Must be a resident of [facility] -- Must have NO insurance coverage whatsoever (no Medicaid/ Medicare, etc.) -- The pt. MUST have a primary care doctor that directs their care regularly and follows them in the community   [facility]ist  [phone]   [employer]  [phone]    Agencies that provide inexpensive medical care: Organization         Address  Phone   Notes  [place]  [phone]   [facility]    [phone]   [facility] [place], [place] [address] [phone]   [place] [address], [place] [phone]   Planned Parenthood    [phone]   [facility]    [phone]   [place] and [place] [address], [place] Phone:  [phone], Fax:  [phone] Hours of Operation:  9 am - 6 pm, M-F.  Also accepts Medicaid/Medicare and self-pay.  [facility] for [address], [address],  [place] Phone: [phone], Fax: [phone]. Hours of Operation:  8:30 am - 5:30 pm, M-F.  Also accepts Medicaid and self-pay.  [facility] [place] [address] [place] Phone: [phone]   [place], [place], [place] [phone], Ext. [phone] Mondays & Thursdays: 7-9 AM.  First 15 patients are seen on a first come, first serve basis.    [place] Providers:  Organization         Address  Phone   Notes  [facility] [address], [place] [phone] Also accepts self-pay patients.  [facility] [id number] [place], [place]  [phone]   [place], [address], [place] [phone]   [facility] [address] [place] [phone]   Lucianne Lei [address], [place]   [phone] Only accepts [place] Access [place] patients after they have their name applied to their card.   Self-Pay (no insurance) in [facility]:  Organization         Address  Phone   Notes  Sickle Cell Patients, [facility] [place] [phone]   [facility] [place] [phone]   [facility] [place]  [place], [place], [place] [phone]   [facility]/Dr. Osei-Bonsu  [address], [place] or [address], [place] [phone] Phone number for both [place] and [place] locations is the same.  [facility]gent [facility] [address], [place] [phone]   [facility] [address], [place] or [address] [phone]   [facility] [address], [place] [phone], phone; [phone], fax Sees patients 1st and 3rd Saturday of every month.  Must not  qualify for public or private insurance (i.e. Medicaid, Medicare, [place] Health Choice, Veterans' Benefits)  Household income should be no more than 200% of the poverty level The clinic cannot treat you if you are pregnant or think you are pregnant  Sexually transmitted diseases are not treated at the clinic.    Dental Care: Organization         Address  Phone  Notes  [facility] Department of [facility] [place] [phone] Accepts children up to age 12 who are enrolled in [place] or [place]; pregnant women with a Medicaid card; and children who have applied for Medicaid or [place] Health Choice, but were declined, whose parents can pay a reduced fee at time of service.  [facility] Department of [facility]  [address], [place] [phone] Accepts children up to age 50 who are enrolled in [place] or [place]; pregnant women with a Medicaid card; and children who have applied for Medicaid or [place] Health Choice, but were declined, whose parents can pay a reduced fee at time of service.  [facility] Adult Dental Access PROGRAM  [place] [phone] Patients are seen by appointment only. Walk-ins are not accepted. [place] will see patients 75 years of age and older. Monday - Tuesday (8am-5pm) Most Wednesdays (8:30-5pm) $30 per visit, cash only  [facility] Adult Dental Access PROGRAM  [address], [facility] [phone] Patients are seen by appointment only. Walk-ins are not accepted. [place] will see patients 28 years of age and older. One Wednesday Evening (Monthly: Volunteer Based).  $30 per visit, cash only  [place]  [phone] for adults; Children under age 54, call Graduate Pediatric Dentistry at [phone]. Children aged 65-14, please call [phone] to request a pediatric application.  Dental services are provided  in all areas of dental care including fillings, crowns  and bridges, complete and partial dentures, implants, gum treatment, root canals, and extractions. Preventive care is also provided. Treatment is provided to both adults and children. Patients are selected via a lottery and there is often a waiting list.   [facility] [address], [place]  [phone] [email or web address]   [facility] [address] [place], [place] [phone], Ext. [phone] Second and Fourth Thursday of each month, opens at 6:30 AM; Clinic ends at 9 AM.  Patients are seen on a first-come first-served basis, and a limited number are seen during each clinic.   [facility]  [address] [place]ard Danker [place], [place] [phone]   Eligibility Requirements You must have lived in [place], [place], or [place] counties for at least the last three months.   You cannot be eligible for state or federal sponsored [employer], including [employer], [place], or [employer].   You generally cannot be eligible for healthcare insurance through your employer.    How to apply: Eligibility screenings are held every Tuesday and Wednesday afternoon from 1:00 pm until 4:00 pm. You do not need an appointment for the interview!  [facility] [address], [place], [place]   [place]  [place] Department  [place]  [phone]    Behavioral Health Resources in the Community: Intensive Outpatient Programs Organization         Address  Phone  Notes  [place]. [address] [place], [place] [phone]   [facility] Outpatient [address], [place], [place]   ADS: Alcohol [facility] Drug S[facility] [address], [place], [place]   [place] [address],  [place], [place] or [phone]   Substance Abuse Resources Organization         Address  Phone  Notes  Alcohol and Drug Services  [phone]   [place]  [phone]   [facility]   Chinita Pester  [phone]   Residential & Outpatient Substance Abuse Program  [phone]   Psychological Services Organization         Address  Phone  Notes  [facility] [place]  [place]  [phone]   [place] [address] [place] or [phone]    Mobile Crisis Teams Organization         Address  Phone  Notes  [facility], Mobile Crisis Care Unit  [phone]   [facility]  [address] [place], [place]   [place] [address], [place] [phone]    Self-Help/Support Groups Organization         Address  Phone             Notes  [place]. of [place] - variety of support groups  [place] Call for more information  Narcotics Anonymous (NA), [facility] [address], [employer] [place]  2 meetings at this location   Special educational needs teacher         Address  Phone  Notes  [facility] Residential Treatment [place],    [place]  [place]  [address] [place], [place], [place]   [place], [place] [phone]  Admissions: 8am-3pm M-F  Incentives Substance [place] [address],    [place], [place] [phone]   [facility] [address] [place], [place], [place]   [facility] [address],  [place], [place]   [facility] - Intensive Outpatient [address], [place], [place]   [facility] ([place].) [place].,  [place], [place] [phone] or  [phone]   [facility] Services (RTS) [address], [place], [place] Accepts Medicaid  [facility] [place] [address],  [place] [phone] Substance Abuse/Addiction Treatment   [facility] Organization         Address  Phone  Notes  [facility]  [phone]   Domenic Schwab, PhD [address] Arlis Porta [place], [place]   [phone] or [phone]   [facility] [place], [place] [phone]   [facility] [address], [place], [place] [phone] Insurance/Medicaid/sponsorship through [facility] and Families [address], [address]                                    [place], [place] [phone] [place] [address], [place] [phone]    Dr. Adele Schilder  [phone]   [facility] [place] Dept. 1) [address], [place] 2) [place] 3)  [place] [address], [place] [phone]  [phone]   [place] [phone] or [phone] (After Hours)

## 2014-04-05 ENCOUNTER — Encounter: Payer: Self-pay | Admitting: *Deleted

## 2014-04-05 ENCOUNTER — Emergency Department (HOSPITAL_COMMUNITY): Payer: Medicare Other

## 2014-04-05 ENCOUNTER — Telehealth: Payer: Self-pay | Admitting: *Deleted

## 2014-04-05 ENCOUNTER — Encounter (HOSPITAL_COMMUNITY): Payer: Self-pay | Admitting: Emergency Medicine

## 2014-04-05 ENCOUNTER — Emergency Department (HOSPITAL_COMMUNITY)
Admission: EM | Admit: 2014-04-05 | Discharge: 2014-04-05 | Disposition: A | Discharge status: Home / Self Care | Payer: Medicare Other | Attending: Emergency Medicine | Admitting: Emergency Medicine

## 2014-04-05 DIAGNOSIS — Z79899 Other long term (current) drug therapy: Secondary | ICD-10-CM | POA: Insufficient documentation

## 2014-04-05 DIAGNOSIS — R0602 Shortness of breath: Secondary | ICD-10-CM | POA: Diagnosis not present

## 2014-04-05 DIAGNOSIS — Z862 Personal history of diseases of the blood and blood-forming organs and certain disorders involving the immune mechanism: Secondary | ICD-10-CM | POA: Diagnosis not present

## 2014-04-05 DIAGNOSIS — Z86718 Personal history of other venous thrombosis and embolism: Secondary | ICD-10-CM | POA: Diagnosis not present

## 2014-04-05 DIAGNOSIS — Z992 Dependence on renal dialysis: Secondary | ICD-10-CM | POA: Insufficient documentation

## 2014-04-05 DIAGNOSIS — Z872 Personal history of diseases of the skin and subcutaneous tissue: Secondary | ICD-10-CM | POA: Diagnosis not present

## 2014-04-05 DIAGNOSIS — Z792 Long term (current) use of antibiotics: Secondary | ICD-10-CM | POA: Insufficient documentation

## 2014-04-05 DIAGNOSIS — R079 Chest pain, unspecified: Secondary | ICD-10-CM | POA: Diagnosis present

## 2014-04-05 DIAGNOSIS — M1990 Unspecified osteoarthritis, unspecified site: Secondary | ICD-10-CM | POA: Insufficient documentation

## 2014-04-05 DIAGNOSIS — M109 Gout, unspecified: Secondary | ICD-10-CM | POA: Diagnosis not present

## 2014-04-05 DIAGNOSIS — R1010 Upper abdominal pain, unspecified: Secondary | ICD-10-CM | POA: Insufficient documentation

## 2014-04-05 DIAGNOSIS — E1140 Type 2 diabetes mellitus with diabetic neuropathy, unspecified: Secondary | ICD-10-CM | POA: Diagnosis not present

## 2014-04-05 DIAGNOSIS — I120 Hypertensive chronic kidney disease with stage 5 chronic kidney disease or end stage renal disease: Secondary | ICD-10-CM | POA: Insufficient documentation

## 2014-04-05 DIAGNOSIS — Z8719 Personal history of other diseases of the digestive system: Secondary | ICD-10-CM | POA: Diagnosis not present

## 2014-04-05 DIAGNOSIS — N186 End stage renal disease: Secondary | ICD-10-CM | POA: Insufficient documentation

## 2014-04-05 DIAGNOSIS — G8929 Other chronic pain: Secondary | ICD-10-CM | POA: Diagnosis not present

## 2014-04-05 LAB — CBC WITH DIFFERENTIAL/PLATELET
Basophils Absolute: 0 10*3/uL (ref 0.0–0.1)
Basophils Relative: 0 % (ref 0–1)
EOS PCT: 2 % (ref 0–5)
Eosinophils Absolute: 0.2 10*3/uL (ref 0.0–0.7)
HCT: 35.8 % — ABNORMAL LOW (ref 39.0–52.0)
HEMOGLOBIN: 10.8 g/dL — AB (ref 13.0–17.0)
LYMPHS ABS: 1.9 10*3/uL (ref 0.7–4.0)
Lymphocytes Relative: 22 % (ref 12–46)
MCH: 27.9 pg (ref 26.0–34.0)
MCHC: 30.2 g/dL (ref 30.0–36.0)
MCV: 92.5 fL (ref 78.0–100.0)
MONOS PCT: 9 % (ref 3–12)
Monocytes Absolute: 0.8 10*3/uL (ref 0.1–1.0)
NEUTROS PCT: 67 % (ref 43–77)
Neutro Abs: 6.1 10*3/uL (ref 1.7–7.7)
Platelets: 147 10*3/uL — ABNORMAL LOW (ref 150–400)
RBC: 3.87 MIL/uL — AB (ref 4.22–5.81)
RDW: 17.2 % — ABNORMAL HIGH (ref 11.5–15.5)
WBC: 9 10*3/uL (ref 4.0–10.5)

## 2014-04-05 LAB — BASIC METABOLIC PANEL
Anion gap: 10 (ref 5–15)
BUN: 18 mg/dL (ref 6–23)
CHLORIDE: 100 mmol/L (ref 96–112)
CO2: 31 mmol/L (ref 19–32)
Calcium: 8.8 mg/dL (ref 8.4–10.5)
Creatinine, Ser: 4.42 mg/dL — ABNORMAL HIGH (ref 0.50–1.35)
GFR calc non Af Amer: 15 mL/min — ABNORMAL LOW (ref >90)
GFR, EST AFRICAN AMERICAN: 17 mL/min — AB (ref >90)
Glucose, Bld: 82 mg/dL (ref 70–99)
POTASSIUM: 3.4 mmol/L — AB (ref 3.5–5.1)
Sodium: 141 mmol/L (ref 135–145)

## 2014-04-05 LAB — TROPONIN I: Troponin I: <0.03 ng/mL (ref <0.031)

## 2014-04-05 MED ORDER — TECHNETIUM TC 99M DIETHYLENETRIAME-PENTAACETIC ACID
40.0000 | Freq: Once | INTRAVENOUS | Status: AC | PRN
  Administered 2014-04-05: 40 via RESPIRATORY_TRACT

## 2014-04-05 MED ORDER — TECHNETIUM TO 99M ALBUMIN AGGREGATED
6.0000 | Freq: Once | INTRAVENOUS | Status: AC | PRN
  Administered 2014-04-05: 6 via INTRAVENOUS

## 2014-04-05 NOTE — Discharge Instructions (Signed)

## 2014-04-05 NOTE — ED Provider Notes (Signed)
CSN: [id number]     Arrival date & time 04/05/14  1435 History  This chart was scribed for Tanna Furry, MD by Einar Pheasant, ED Scribe. This patient was seen in room APA12/APA12 and the patient's care was started at 3:55 PM.    Chief Complaint  Patient presents with  . Chest Pain   HPI HPI Comments: Jack Irwin is a 49 y.o. male with PMhx of HTN, DVT, leg edema, and chronic kidney diesease presents to the Emergency Department complaining of sudden onset central chest pain that started at 12:30PM right after being unhooked from dialysis machine.  He states that he had to get extra fluid removed at dialysis. Pt is also complaining of associated SOB upon onset. Pt states that the pain is exacerbated by coughing which started earlier today. He endorses generalized myalgia. Pt denies any  fever, neck pain, sore throat, visual disturbance, abdominal pain, nausea, emesis, diarrhea, urinary symptoms, back pain, HA, weakness, numbness and rash as associated symptoms.     Past Medical History  Diagnosis Date  . Hypertension   . Diabetes mellitus   . Gout   . Chronic back pain   . BPH (benign prostatic hyperplasia)   . Anemia   . DVT (deep venous thrombosis)   . Neuropathy   . Decubitus ulcer     of 2nd toes of both feet.  . Edema leg   . Constipation   . Venous (peripheral) insufficiency   . Neuropathy, diabetic   . Physical deconditioning   . Poor balance   . Difficulty walking   . Lack of coordination   . Arthritis   . Renal insufficiency   . Chronic kidney disease   . Chronic kidney disease (CKD), stage IV (severe)   . Constipation    Past Surgical History  Procedure Laterality Date  . None    . Insertion of dialysis catheter    . Cyst excision Right     cyst removed on thumb 2001  . Insertion of dialysis catheter Left 03/09/2014    Procedure: INSERTION OF DIALYSIS CATHETER;  Surgeon: Angelia Mould, MD;  [facility];  Service: Vascular;  Laterality: Left;  . Av  fistula placement Left 03/09/2014    Procedure: INSERTION OF ARTERIOVENOUS (AV) GORE-TEX GRAFT ARM;  Surgeon: Angelia Mould, MD;  Location: [facility] OR;  Service: Vascular;  Laterality: Left;   Family History  Problem Relation Age of Onset  . Diabetes Mother   . Hypertension Mother   . Heart failure Mother   . Hyperlipidemia Mother   . Heart attack Mother   . Cancer Father   . Diabetes Father   . Hypertension Father   . Hyperlipidemia Father    History  Substance Use Topics  . Smoking status: Never Smoker   . Smokeless tobacco: Never Used  . Alcohol Use: No    Review of Systems    Allergies  Review of patient's allergies indicates no known allergies.  Home Medications   Prior to Admission medications   Medication Sig Start Date End Date Taking? Authorizing Provider  febuxostat (ULORIC) 40 MG tablet Take 3 tablets (120 mg total) by mouth daily. Patient taking differently: Take 80 mg by mouth daily.  10/04/13  Yes [name], MD  sildenafil (VIAGRA) 50 MG tablet Take 50 mg by mouth daily as needed for erectile dysfunction.   Yes Historical Provider, MD  colchicine (COLCRYS) 0.6 MG tablet Take 1 tablet (0.6 mg total) by mouth daily.  Patient not taking: Reported on 04/04/2014 10/04/13   [name], MD  ondansetron (ZOFRAN ODT) 4 MG disintegrating tablet 4mg  ODT q4 hours prn nausea/vomit Patient not taking: Reported on 03/20/2014 02/16/14   Maudry Diego, MD  oxyCODONE-acetaminophen (PERCOCET) 5-325 MG per tablet Take 1-2 tablets by mouth every 4 (four) hours as needed for moderate pain. Patient not taking: Reported on 03/20/2014 03/09/14   Hulen Shouts Rhyne, PA-C  penicillin v potassium (VEETID) 500 MG tablet Take 1 tablet (500 mg total) by mouth 3 (three) times daily. Patient not taking: Reported on 03/20/2014 03/03/14   Fredia Sorrow, MD  traMADol (ULTRAM) 50 MG tablet Take 1 tablet (50 mg total) by mouth every 6 (six) hours as needed. Patient not taking: Reported on 04/04/2014  12/13/13   Glendell Docker, NP   BP 98/76 mmHg  Pulse 116  Temp(Src) 99.1 F (37.3 C) (Oral)  Resp 18  Ht 5\' 11"  (1.803 m)  Wt 259 lb (117.482 kg)  BMI 36.14 kg/m2  SpO2 100%  Physical Exam  Constitutional: He is oriented to person, place, and time. He appears well-developed and well-nourished. No distress.  HENT:  Head: Normocephalic.  Eyes: Conjunctivae are normal. Pupils are equal, round, and reactive to light. No scleral icterus.  Neck: Normal range of motion. Neck supple. No thyromegaly present.  Cardiovascular: Normal rate and regular rhythm.  Exam reveals no gallop and no friction rub.   No murmur heard. Heart rate 105.  Pulmonary/Chest: Effort normal and breath sounds normal. No respiratory distress. He has no wheezes. He has no rales.  Tenderness to lower sternum but pt unsure if this reproduces pain.   Abdominal: Soft. Bowel sounds are normal. He exhibits no distension. There is tenderness. There is no rebound.  Tenderness to upper abdomen.   Musculoskeletal: Normal range of motion.  Neurological: He is alert and oriented to person, place, and time.  Skin: Skin is warm and dry. No rash noted.  Psychiatric: He has a normal mood and affect. His behavior is normal.    ED Course  Procedures (including critical care time)  DIAGNOSTIC STUDIES: Oxygen Saturation is 100% on RA, normal by my interpretation.    COORDINATION OF CARE: 8:47 PM- Pt advised of plan for treatment and pt agrees.  Labs Review Labs Reviewed  CBC WITH DIFFERENTIAL/PLATELET - Abnormal; Notable for the following:    RBC 3.87 (*)    Hemoglobin 10.8 (*)    HCT 35.8 (*)    RDW 17.2 (*)    Platelets 147 (*)    All other components within normal limits  BASIC METABOLIC PANEL - Abnormal; Notable for the following:    Potassium 3.4 (*)    Creatinine, Ser 4.42 (*)    GFR calc non Af Amer 15 (*)    GFR calc Af Amer 17 (*)    All other components within normal limits  TROPONIN I    Imaging  Review Dg Chest 2 View  04/05/2014   CLINICAL DATA:  Mid chest pain and shortness of breath following hemodialysis today.  EXAM: CHEST - 2 VIEW  COMPARISON:  02/16/2014  FINDINGS: Left jugular dialysis catheter extends into SVC. There is no evidence of pulmonary edema, consolidation, pneumothorax, nodule or pleural fluid. The heart size is at the upper limits of normal. The bony thorax is unremarkable.  IMPRESSION: No active disease.   Electronically Signed   By: Aletta Edouard M.D.   On: 04/05/2014 18:41   Nm Pulmonary Perf And Vent  04/05/2014   CLINICAL DATA:  Shortness of breath and chest pain for 1 day.  EXAM: NUCLEAR MEDICINE VENTILATION - PERFUSION LUNG SCAN  TECHNIQUE: Ventilation images were obtained in multiple projections using inhaled aerosol technetium 99 M DTPA. Perfusion images were obtained in multiple projections after intravenous injection of Tc-62m MAA.  RADIOPHARMACEUTICALS:  40 mCi Tc-84m DTPA aerosol and 6 mCi Tc-59m MAA  COMPARISON:  V/Q scan 05/03/2012. PA and lateral chest this same day.  FINDINGS: Ventilation: No focal ventilation defect.  Perfusion: No wedge shaped peripheral perfusion defects to suggest acute pulmonary embolism.  IMPRESSION: Negative exam.   Electronically Signed   By: Inge Rise M.D.   On: 04/05/2014 20:26     EKG Interpretation   Date/Time:  Wednesday April 05 2014 14:44:51 EST Ventricular Rate:  114 PR Interval:  130 QRS Duration: 130 QT Interval:  366 QTC Calculation: 504 R Axis:   14 Text Interpretation:  Sinus tachycardia Right bundle branch block Abnormal  ECG Confirmed by Jeneen Rinks  MD, [place] ([id number]) on 04/05/2014 3:45:32 PM      MDM   Final diagnoses:  Chest pain  SOB (shortness of breath)    Normal VQ scan. Patient had no additional complaints throughout the rest of his department stay. Almost consult asking for "someone to come me hot meal ". Appropriate for discharge at this time.  I personally performed the services described in  this documentation, which was scribed in my presence. The recorded information has been reviewed and is accurate.    Tanna Furry, MD 04/05/14 2048

## 2014-04-05 NOTE — ED Notes (Signed)
Pt reports onset of chest pain and SOB around 1300. Pt states he finished dialysis about 1230.

## 2014-04-05 NOTE — Telephone Encounter (Signed)
Heather at [facility] at [facility] [phone]) called to ask if this patient's graft can be accessed.  Per Dr. Scot Dock, left upper arm loop AV graft may be used since it is 4 weeks since placement on 03-09-14. They understand that patient must have 2 successful accesses prior to his right IJ TDC being removed. A letter was faxed to the [facility] at [phone].

## 2014-04-05 NOTE — ED Notes (Signed)
Discharge instructions reviewed with pt, questions answered. Pt verbalized understanding.  

## 2014-04-12 ENCOUNTER — Emergency Department (HOSPITAL_COMMUNITY)
Admission: EM | Admit: 2014-04-12 | Discharge: 2014-04-12 | Disposition: A | Discharge status: Home / Self Care | Payer: Medicare Other | Attending: Emergency Medicine | Admitting: Emergency Medicine

## 2014-04-12 ENCOUNTER — Encounter (HOSPITAL_COMMUNITY): Payer: Self-pay

## 2014-04-12 DIAGNOSIS — M79642 Pain in left hand: Secondary | ICD-10-CM | POA: Diagnosis present

## 2014-04-12 DIAGNOSIS — Z8719 Personal history of other diseases of the digestive system: Secondary | ICD-10-CM | POA: Insufficient documentation

## 2014-04-12 DIAGNOSIS — Z862 Personal history of diseases of the blood and blood-forming organs and certain disorders involving the immune mechanism: Secondary | ICD-10-CM | POA: Insufficient documentation

## 2014-04-12 DIAGNOSIS — Z8669 Personal history of other diseases of the nervous system and sense organs: Secondary | ICD-10-CM | POA: Insufficient documentation

## 2014-04-12 DIAGNOSIS — G8929 Other chronic pain: Secondary | ICD-10-CM | POA: Insufficient documentation

## 2014-04-12 DIAGNOSIS — Z79899 Other long term (current) drug therapy: Secondary | ICD-10-CM | POA: Diagnosis not present

## 2014-04-12 DIAGNOSIS — M109 Gout, unspecified: Secondary | ICD-10-CM | POA: Diagnosis not present

## 2014-04-12 DIAGNOSIS — Z86718 Personal history of other venous thrombosis and embolism: Secondary | ICD-10-CM | POA: Diagnosis not present

## 2014-04-12 DIAGNOSIS — I120 Hypertensive chronic kidney disease with stage 5 chronic kidney disease or end stage renal disease: Secondary | ICD-10-CM | POA: Diagnosis not present

## 2014-04-12 DIAGNOSIS — N186 End stage renal disease: Secondary | ICD-10-CM | POA: Insufficient documentation

## 2014-04-12 DIAGNOSIS — Z992 Dependence on renal dialysis: Secondary | ICD-10-CM | POA: Insufficient documentation

## 2014-04-12 DIAGNOSIS — E119 Type 2 diabetes mellitus without complications: Secondary | ICD-10-CM | POA: Insufficient documentation

## 2014-04-12 MED ORDER — COLCHICINE 0.6 MG PO TABS: 0.3000 mg | ORAL_TABLET | ORAL | Status: DC

## 2014-04-12 MED ORDER — OXYCODONE-ACETAMINOPHEN 5-325 MG PO TABS
2.0000 | ORAL_TABLET | Freq: Once | ORAL | Status: AC
  Administered 2014-04-12: 2 via ORAL
  Filled 2014-04-12: qty 2

## 2014-04-12 NOTE — ED Provider Notes (Signed)
CSN: [id number]     Arrival date & time 04/12/14  1523 History   First MD Initiated Contact with Patient 04/12/14 1719     Chief Complaint  Patient presents with  . hand pain      (Consider location/radiation/quality/duration/timing/severity/associated sxs/prior Treatment) HPI Comments: The pt is a pleasant 49 y/o male on dialysis m,w,f, he had dialysis today - noted that he has had swelling in the L hand and wrist and the L foot since yesterday - worse with palpation, and ROM, no assocaited fevers, chills or redness over teh area - no injury.  He has a hx of gout, is on colchicine PRN but is out.  No other pain meds pta.  Sx are constant.    The history is provided by the patient and medical records.    Past Medical History  Diagnosis Date  . Hypertension   . Diabetes mellitus   . Gout   . Chronic back pain   . BPH (benign prostatic hyperplasia)   . Anemia   . DVT (deep venous thrombosis)   . Neuropathy   . Decubitus ulcer     of 2nd toes of both feet.  . Edema leg   . Constipation   . Venous (peripheral) insufficiency   . Neuropathy, diabetic   . Physical deconditioning   . Poor balance   . Difficulty walking   . Lack of coordination   . Arthritis   . Renal insufficiency   . Chronic kidney disease   . Chronic kidney disease (CKD), stage IV (severe)   . Constipation    Past Surgical History  Procedure Laterality Date  . None    . Insertion of dialysis catheter    . Cyst excision Right     cyst removed on thumb 2001  . Insertion of dialysis catheter Left 03/09/2014    Procedure: INSERTION OF DIALYSIS CATHETER;  Surgeon: Angelia Mould, MD;  Location: [place];  Service: Vascular;  Laterality: Left;  . Av fistula placement Left 03/09/2014    Procedure: INSERTION OF ARTERIOVENOUS (AV) GORE-TEX GRAFT ARM;  Surgeon: Angelia Mould, MD;  Location: [facility] OR;  Service: Vascular;  Laterality: Left;   Family History  Problem Relation Age of Onset  . Diabetes Mother    . Hypertension Mother   . Heart failure Mother   . Hyperlipidemia Mother   . Heart attack Mother   . Cancer Father   . Diabetes Father   . Hypertension Father   . Hyperlipidemia Father    History  Substance Use Topics  . Smoking status: Never Smoker   . Smokeless tobacco: Never Used  . Alcohol Use: No    Review of Systems  Constitutional: Negative for fever and chills.  Musculoskeletal: Positive for joint swelling.  Skin: Negative for rash.      Allergies  Review of patient's allergies indicates no known allergies.  Home Medications   Prior to Admission medications   Medication Sig Start Date End Date Taking? Authorizing Provider  colchicine 0.6 MG tablet Take 0.5 tablets (0.3 mg total) by mouth 2 (two) times a week. Take 0.6mg  (one tablet) by mouth every 1-2 hours until one of the following occurs: 1.  The pain is gone 2.  The maximum dose has been given ( no more than 3 tabs in 3 hours or 10 tabs in 24 hours) 3.  The side effects outweight the benefits 04/12/14   Noemi Chapel, MD  febuxostat (ULORIC) 40 MG tablet Take 3  tablets (120 mg total) by mouth daily. Patient taking differently: Take 80 mg by mouth daily.  10/04/13   Rolland Porter, MD  ondansetron (ZOFRAN ODT) 4 MG disintegrating tablet 4mg  ODT q4 hours prn nausea/vomit Patient not taking: Reported on 03/20/2014 02/16/14   Milton Ferguson, MD  oxyCODONE-acetaminophen (PERCOCET) 5-325 MG per tablet Take 1-2 tablets by mouth every 4 (four) hours as needed for moderate pain. Patient not taking: Reported on 03/20/2014 03/09/14   Hulen Shouts Rhyne, PA-C  penicillin v potassium (VEETID) 500 MG tablet Take 1 tablet (500 mg total) by mouth 3 (three) times daily. Patient not taking: Reported on 03/20/2014 03/03/14   Fredia Sorrow, MD  sildenafil (VIAGRA) 50 MG tablet Take 50 mg by mouth daily as needed for erectile dysfunction.    Historical Provider, MD  traMADol (ULTRAM) 50 MG tablet Take 1 tablet (50 mg total) by mouth every 6 (six)  hours as needed. Patient not taking: Reported on 04/04/2014 12/13/13   Glendell Docker, NP   BP 112/78 mmHg  Pulse 115  Temp(Src) 98.5 F (36.9 C) (Oral)  Resp 20  Ht 5\' 11"  (1.803 m)  Wt 260 lb (117.935 kg)  BMI 36.28 kg/m2  SpO2 99% Physical Exam  Constitutional: He appears well-developed and well-nourished. No distress.  HENT:  Head: Normocephalic and atraumatic.  Eyes: Conjunctivae are normal. No scleral icterus.  Cardiovascular: Normal rate, regular rhythm and intact distal pulses.   Pulmonary/Chest: Effort normal and breath sounds normal.  Musculoskeletal: He exhibits tenderness ( L wrist and L ankle - mild swelling, no redness or warmth.). He exhibits no edema.  Neurological: He is alert.  Skin: Skin is warm and dry. No rash noted. He is not diaphoretic.  Nursing note and vitals reviewed.   ED Course  Procedures (including critical care time) Labs Review Labs Reviewed - No data to display  Imaging Review No results found.    MDM   Final diagnoses:  Acute gouty arthritis    Discussed with the pharmacist who recommends 0.3 mg of colchicine dose to twice a week.  Doubt setpic arthritis - no tachyc on my exam - pulse of 100.  Normal BP, no fever.  Pt informed of plan and is in agreement.   Noemi Chapel, MD 04/12/14 1739

## 2014-04-12 NOTE — ED Notes (Signed)
Pt not in waiting room

## 2014-04-12 NOTE — Discharge Instructions (Signed)
Gout °Gout is when your joints become red, sore, and swell (inflamed). This is caused by the buildup of uric acid crystals in the joints. Uric acid is a chemical that is normally in the blood. If the level of uric acid gets too high in the blood, these crystals form in your joints and tissues. Over time, these crystals can form into masses near the joints and tissues. These masses can destroy bone and cause the bone to look misshapen (deformed). °HOME CARE  °· Do not take aspirin for pain. °· Only take medicine as told by your doctor. °· Rest the joint as much as you can. When in bed, keep sheets and blankets off painful areas. °· Keep the sore joints raised (elevated). °· Put warm or cold packs on painful joints. Use of warm or cold packs depends on which works best for you. °· Use crutches if the painful joint is in your leg. °· Drink enough fluids to keep your pee (urine) clear or pale yellow. Limit alcohol, sugary drinks, and drinks with fructose in them. °· Follow your diet instructions. Pay careful attention to how much protein you eat. Include fruits, vegetables, whole grains, and fat-free or low-fat milk products in your daily diet. Talk to your doctor or dietitian about the use of coffee, vitamin C, and cherries. These may help lower uric acid levels. °· Keep a healthy body weight. °GET HELP RIGHT AWAY IF:  °· You have watery poop (diarrhea), throw up (vomit), or have any side effects from medicines. °· You do not feel better in 24 hours, or you are getting worse. °· Your joint becomes suddenly more tender, and you have chills or a fever. °MAKE SURE YOU:  °· Understand these instructions. °· Will watch your condition. °· Will get help right away if you are not doing well or get worse. °Document Released: 10/30/2007 Document Revised: 06/06/2013 Document Reviewed: 09/03/2011 °ExitCare® Patient Information ©2015 [employer]. This information is not intended to replace advice given to you by your health care  provider. Make sure you discuss any questions you have with your health care provider. ° °

## 2014-04-12 NOTE — ED Notes (Signed)
Pt c/o pain and swelling in left hand since yesterday.  Reports is out of his gout medication.  Radial pulse present.

## 2014-04-12 NOTE — ED Notes (Signed)
Pt called to treatment area. No answer. Pt not in [place]

## 2014-04-13 ENCOUNTER — Encounter (HOSPITAL_COMMUNITY): Payer: Self-pay | Admitting: Emergency Medicine

## 2014-04-13 ENCOUNTER — Emergency Department (HOSPITAL_COMMUNITY)
Admission: EM | Admit: 2014-04-13 | Discharge: 2014-04-13 | Disposition: A | Discharge status: Home / Self Care | Payer: Medicare Other | Attending: Emergency Medicine | Admitting: Emergency Medicine

## 2014-04-13 DIAGNOSIS — N184 Chronic kidney disease, stage 4 (severe): Secondary | ICD-10-CM | POA: Diagnosis not present

## 2014-04-13 DIAGNOSIS — R000 Tachycardia, unspecified: Secondary | ICD-10-CM | POA: Insufficient documentation

## 2014-04-13 DIAGNOSIS — K5900 Constipation, unspecified: Secondary | ICD-10-CM | POA: Diagnosis not present

## 2014-04-13 DIAGNOSIS — M79642 Pain in left hand: Secondary | ICD-10-CM | POA: Diagnosis present

## 2014-04-13 DIAGNOSIS — G8929 Other chronic pain: Secondary | ICD-10-CM | POA: Diagnosis not present

## 2014-04-13 DIAGNOSIS — I129 Hypertensive chronic kidney disease with stage 1 through stage 4 chronic kidney disease, or unspecified chronic kidney disease: Secondary | ICD-10-CM | POA: Insufficient documentation

## 2014-04-13 DIAGNOSIS — M109 Gout, unspecified: Secondary | ICD-10-CM | POA: Diagnosis not present

## 2014-04-13 DIAGNOSIS — M25532 Pain in left wrist: Secondary | ICD-10-CM | POA: Diagnosis not present

## 2014-04-13 DIAGNOSIS — Z872 Personal history of diseases of the skin and subcutaneous tissue: Secondary | ICD-10-CM | POA: Insufficient documentation

## 2014-04-13 DIAGNOSIS — Z86718 Personal history of other venous thrombosis and embolism: Secondary | ICD-10-CM | POA: Insufficient documentation

## 2014-04-13 DIAGNOSIS — E119 Type 2 diabetes mellitus without complications: Secondary | ICD-10-CM | POA: Diagnosis not present

## 2014-04-13 MED ORDER — DICLOFENAC SODIUM 1 % TD GEL: TRANSDERMAL | Status: DC

## 2014-04-13 MED ORDER — HYDROCODONE-ACETAMINOPHEN 5-325 MG PO TABS: 1.0000 | ORAL_TABLET | ORAL | Status: DC | PRN

## 2014-04-13 MED ORDER — ONDANSETRON HCL 4 MG PO TABS
4.0000 mg | ORAL_TABLET | Freq: Once | ORAL | Status: AC
  Administered 2014-04-13: 4 mg via ORAL
  Filled 2014-04-13: qty 1

## 2014-04-13 MED ORDER — HYDROCODONE-ACETAMINOPHEN 5-325 MG PO TABS
2.0000 | ORAL_TABLET | Freq: Once | ORAL | Status: AC
  Administered 2014-04-13: 2 via ORAL
  Filled 2014-04-13: qty 2

## 2014-04-13 NOTE — ED Notes (Addendum)
Patient c/o left hand pain with swelling. Per patient seen here last night in ED and diagnosed with gout. Per patient taking colchicine with no relief.

## 2014-04-13 NOTE — ED Provider Notes (Signed)
CSN: [id number]     Arrival date & time 04/13/14  1154 History   First MD Initiated Contact with Patient 04/13/14 1404     Chief Complaint  Patient presents with  . Hand Pain     (Consider location/radiation/quality/duration/timing/severity/associated sxs/prior Treatment) Patient is a 49 y.o. male presenting with hand pain. The history is provided by the patient.  Hand Pain This is a chronic problem. The problem occurs intermittently. The problem has been gradually worsening. Associated symptoms include arthralgias and fatigue. Pertinent negatives include no chills, fever, numbness, rash or weakness. Exacerbated by: movement and palpation. Treatments tried: colchicine. The treatment provided no relief.    Past Medical History  Diagnosis Date  . Hypertension   . Diabetes mellitus   . Gout   . Chronic back pain   . BPH (benign prostatic hyperplasia)   . Anemia   . DVT (deep venous thrombosis)   . Neuropathy   . Decubitus ulcer     of 2nd toes of both feet.  . Edema leg   . Constipation   . Venous (peripheral) insufficiency   . Neuropathy, diabetic   . Physical deconditioning   . Poor balance   . Difficulty walking   . Lack of coordination   . Arthritis   . Renal insufficiency   . Chronic kidney disease   . Chronic kidney disease (CKD), stage IV (severe)   . Constipation    Past Surgical History  Procedure Laterality Date  . None    . Insertion of dialysis catheter    . Cyst excision Right     cyst removed on thumb 2001  . Insertion of dialysis catheter Left 03/09/2014    Procedure: INSERTION OF DIALYSIS CATHETER;  Surgeon: Angelia Mould, MD;  Location: [place];  Service: Vascular;  Laterality: Left;  . Av fistula placement Left 03/09/2014    Procedure: INSERTION OF ARTERIOVENOUS (AV) GORE-TEX GRAFT ARM;  Surgeon: Angelia Mould, MD;  Location: [facility] OR;  Service: Vascular;  Laterality: Left;   Family History  Problem Relation Age of Onset  . Diabetes Mother    . Hypertension Mother   . Heart failure Mother   . Hyperlipidemia Mother   . Heart attack Mother   . Cancer Father   . Diabetes Father   . Hypertension Father   . Hyperlipidemia Father    History  Substance Use Topics  . Smoking status: Never Smoker   . Smokeless tobacco: Never Used  . Alcohol Use: No    Review of Systems  Constitutional: Positive for fatigue. Negative for fever and chills.  Gastrointestinal: Positive for constipation.  Musculoskeletal: Positive for back pain and arthralgias.  Skin: Negative for rash.  Neurological: Negative for weakness and numbness.  All other systems reviewed and are negative.     Allergies  Review of patient's allergies indicates no known allergies.  Home Medications   Prior to Admission medications   Medication Sig Start Date End Date Taking? Authorizing Provider  calcium acetate (PHOSLO) 667 MG capsule Take 1 capsule by mouth 3 (three) times daily. 04/11/14  Yes Historical Provider, MD  colchicine 0.6 MG tablet Take 0.5 tablets (0.3 mg total) by mouth 2 (two) times a week. Take 0.6mg  (one tablet) by mouth every 1-2 hours until one of the following occurs: 1.  The pain is gone 2.  The maximum dose has been given ( no more than 3 tabs in 3 hours or 10 tabs in 24 hours) 3.  The side  effects outweight the benefits 04/12/14  Yes Noemi Chapel, MD  febuxostat (ULORIC) 40 MG tablet Take 3 tablets (120 mg total) by mouth daily. Patient taking differently: Take 80 mg by mouth daily.  10/04/13  Yes Rolland Porter, MD  sildenafil (VIAGRA) 50 MG tablet Take 50 mg by mouth daily as needed for erectile dysfunction.   Yes Historical Provider, MD  ondansetron (ZOFRAN ODT) 4 MG disintegrating tablet 4mg  ODT q4 hours prn nausea/vomit Patient not taking: Reported on 03/20/2014 02/16/14   Milton Ferguson, MD  oxyCODONE-acetaminophen (PERCOCET) 5-325 MG per tablet Take 1-2 tablets by mouth every 4 (four) hours as needed for moderate pain. Patient not taking: Reported  on 03/20/2014 03/09/14   Hulen Shouts Rhyne, PA-C  penicillin v potassium (VEETID) 500 MG tablet Take 1 tablet (500 mg total) by mouth 3 (three) times daily. Patient not taking: Reported on 03/20/2014 03/03/14   Fredia Sorrow, MD  traMADol (ULTRAM) 50 MG tablet Take 1 tablet (50 mg total) by mouth every 6 (six) hours as needed. Patient not taking: Reported on 04/04/2014 12/13/13   Glendell Docker, NP   BP 126/84 mmHg  Pulse 109  Temp(Src) 99 F (37.2 C) (Oral)  Ht 5\' 11"  (1.803 m)  Wt 260 lb (117.935 kg)  BMI 36.28 kg/m2  SpO2 99% Physical Exam  Constitutional: He is oriented to person, place, and time. He appears well-developed and well-nourished.  Non-toxic appearance.  HENT:  Head: Normocephalic.  Right Ear: Tympanic membrane and external ear normal.  Left Ear: Tympanic membrane and external ear normal.  Eyes: EOM and lids are normal. Pupils are equal, round, and reactive to light.  Neck: Normal range of motion. Neck supple. Carotid bruit is not present.  Cardiovascular: Regular rhythm, normal heart sounds, intact distal pulses and normal pulses.  Tachycardia present.   Pulmonary/Chest: Breath sounds normal. No respiratory distress.  Abdominal: Soft. Bowel sounds are normal. There is no tenderness. There is no guarding.  Musculoskeletal:       Left wrist: He exhibits decreased range of motion and tenderness.       Left hand: He exhibits decreased range of motion, tenderness and swelling. He exhibits normal capillary refill and no deformity. Normal sensation noted. Normal strength noted.  Lymphadenopathy:       Head (right side): No submandibular adenopathy present.       Head (left side): No submandibular adenopathy present.    He has no cervical adenopathy.  Neurological: He is alert and oriented to person, place, and time. He has normal strength. No cranial nerve deficit or sensory deficit.  Skin: Skin is warm and dry.  Psychiatric: He has a normal mood and affect. His speech is  normal.  Nursing note and vitals reviewed.   ED Course  Procedures (including critical care time) Labs Review Labs Reviewed - No data to display  Imaging Review No results found.   EKG Interpretation None      MDM  Patient was evaluated for similar pain and problem on yesterday March 9. The patient was given a renal dose of colchicine. The patient states that this dose is not helping him, and his pain is actually more uncomfortable today than it was on yesterday. The examination does not support a septic joint or signs of infection. There no gross neurovascular changes appreciated about the upper extremity. The patient will be treated with diclofenac gel, and Norco every 6 hours if needed for pain. I have encouraged the patient to see his primary physician Mr.  Alford Highland to assist him with his breakthrough pain, and coordination of his care.    Final diagnoses:  None    *I have reviewed nursing notes, vital signs, and all appropriate lab and imaging results for this patient.[address], PA-C 04/13/14 1448  Milton Ferguson, MD 04/13/14 [phone]

## 2014-04-13 NOTE — Discharge Instructions (Signed)
Your examination is consistent with arthritis/gout flareup. Please apply diclofenac gel 3 times daily to your hands and wrist. May use Norco every 4-6 hours for pain if needed. Norco may cause drowsiness, please use with caution. Please call Jack Irwin today for an office appointment to manage your bouts of pain, and to coordinate your care. Gout Gout is when your joints become red, sore, and swell (inflamed). This is caused by the buildup of uric acid crystals in the joints. Uric acid is a chemical that is normally in the blood. If the level of uric acid gets too high in the blood, these crystals form in your joints and tissues. Over time, these crystals can form into masses near the joints and tissues. These masses can destroy bone and cause the bone to look misshapen (deformed). HOME CARE   Do not take aspirin for pain.  Only take medicine as told by your doctor.  Rest the joint as much as you can. When in bed, keep sheets and blankets off painful areas.  Keep the sore joints raised (elevated).  Put warm or cold packs on painful joints. Use of warm or cold packs depends on which works best for you.  Use crutches if the painful joint is in your leg.  Drink enough fluids to keep your pee (urine) clear or pale yellow. Limit alcohol, sugary drinks, and drinks with fructose in them.  Follow your diet instructions. Pay careful attention to how much protein you eat. Include fruits, vegetables, whole grains, and fat-free or low-fat milk products in your daily diet. Talk to your doctor or dietitian about the use of coffee, vitamin C, and cherries. These may help lower uric acid levels.  Keep a healthy body weight. GET HELP RIGHT AWAY IF:   You have watery poop (diarrhea), throw up (vomit), or have any side effects from medicines.  You do not feel better in 24 hours, or you are getting worse.  Your joint becomes suddenly more tender, and you have chills or a fever. MAKE SURE YOU:    Understand these instructions.  Will watch your condition.  Will get help right away if you are not doing well or get worse. Document Released: 10/30/2007 Document Revised: 06/06/2013 Document Reviewed: 09/03/2011 [facility] Patient Information 2015 [place], [place]. This information is not intended to replace advice given to you by your health care provider. Make sure you discuss any questions you have with your health care provider.

## 2014-05-01 IMAGING — CR DG ABDOMEN ACUTE W/ 1V CHEST
3 series · 3 of 3 positions shown · non-contrast
Comparison: 06/02/2012.

CLINICAL DATA: Abdominal pain and nausea.

EXAM:
ACUTE ABDOMEN SERIES (ABDOMEN 2 VIEW & CHEST 1 VIEW)

[view not recorded (1 of 3)]
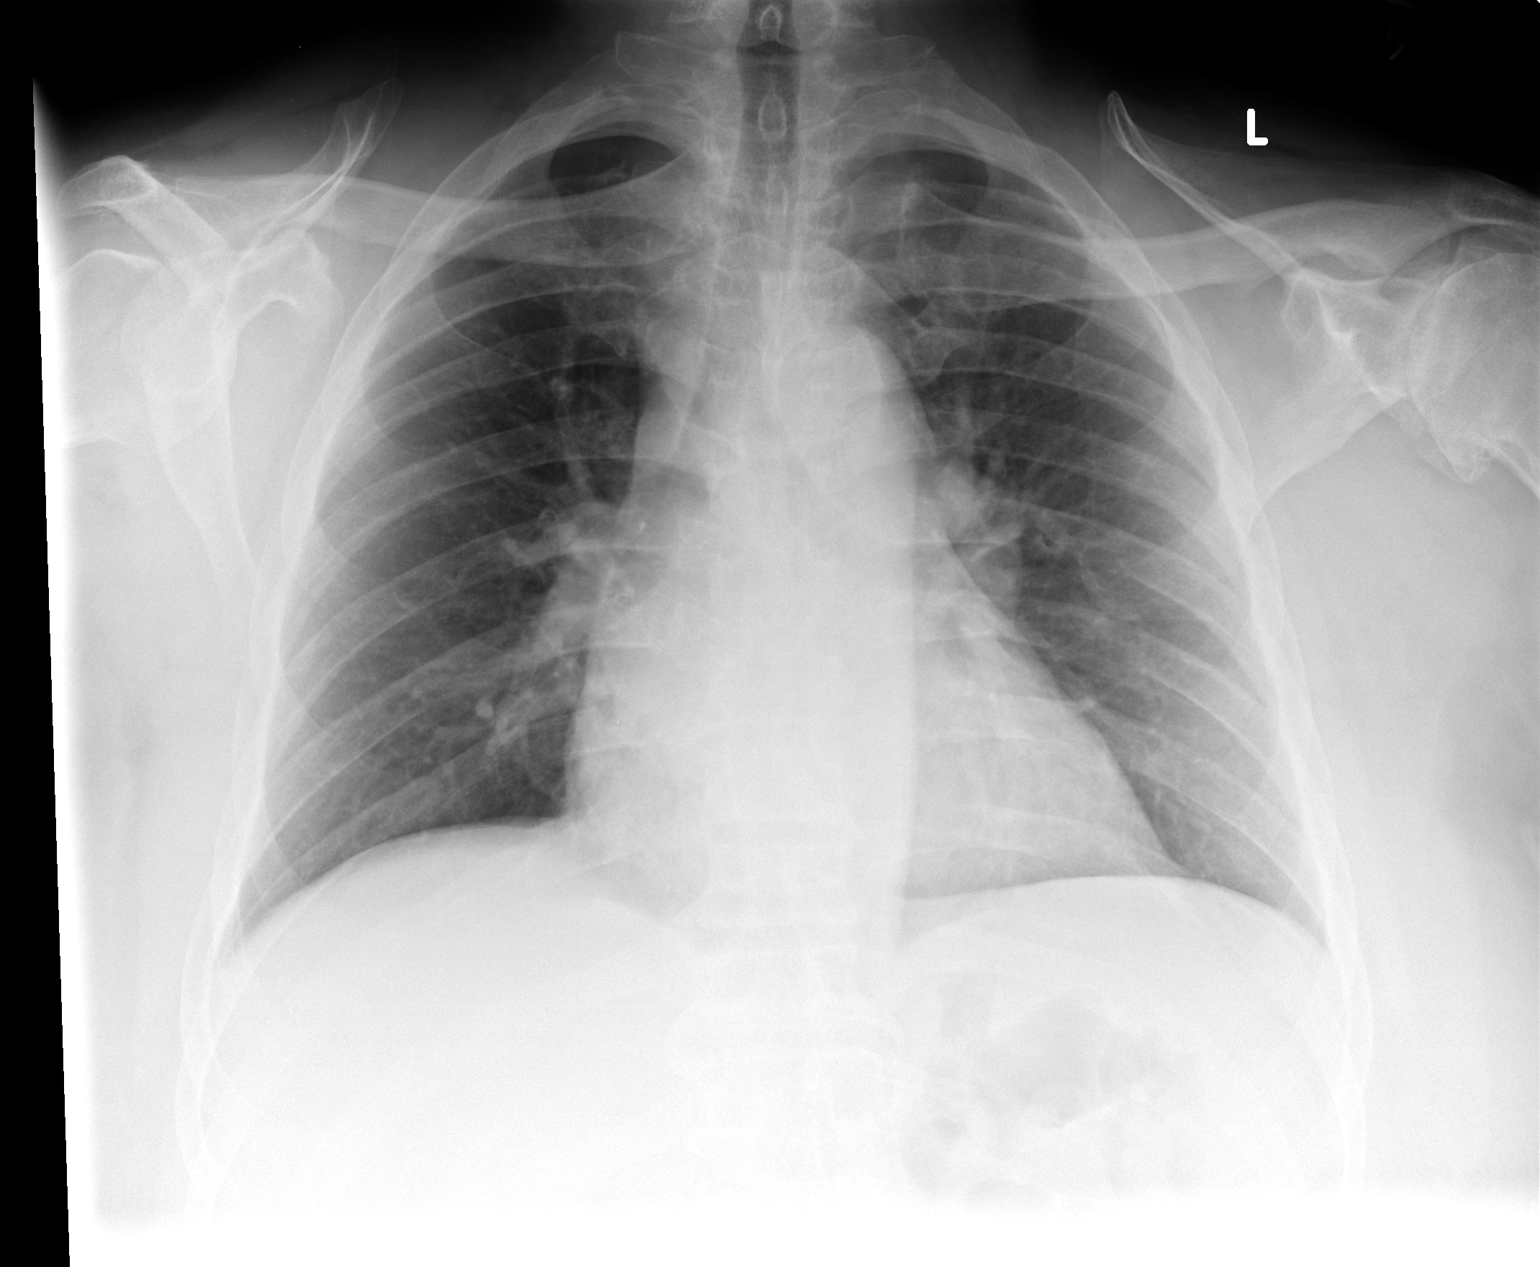

[view not recorded (2 of 3)]
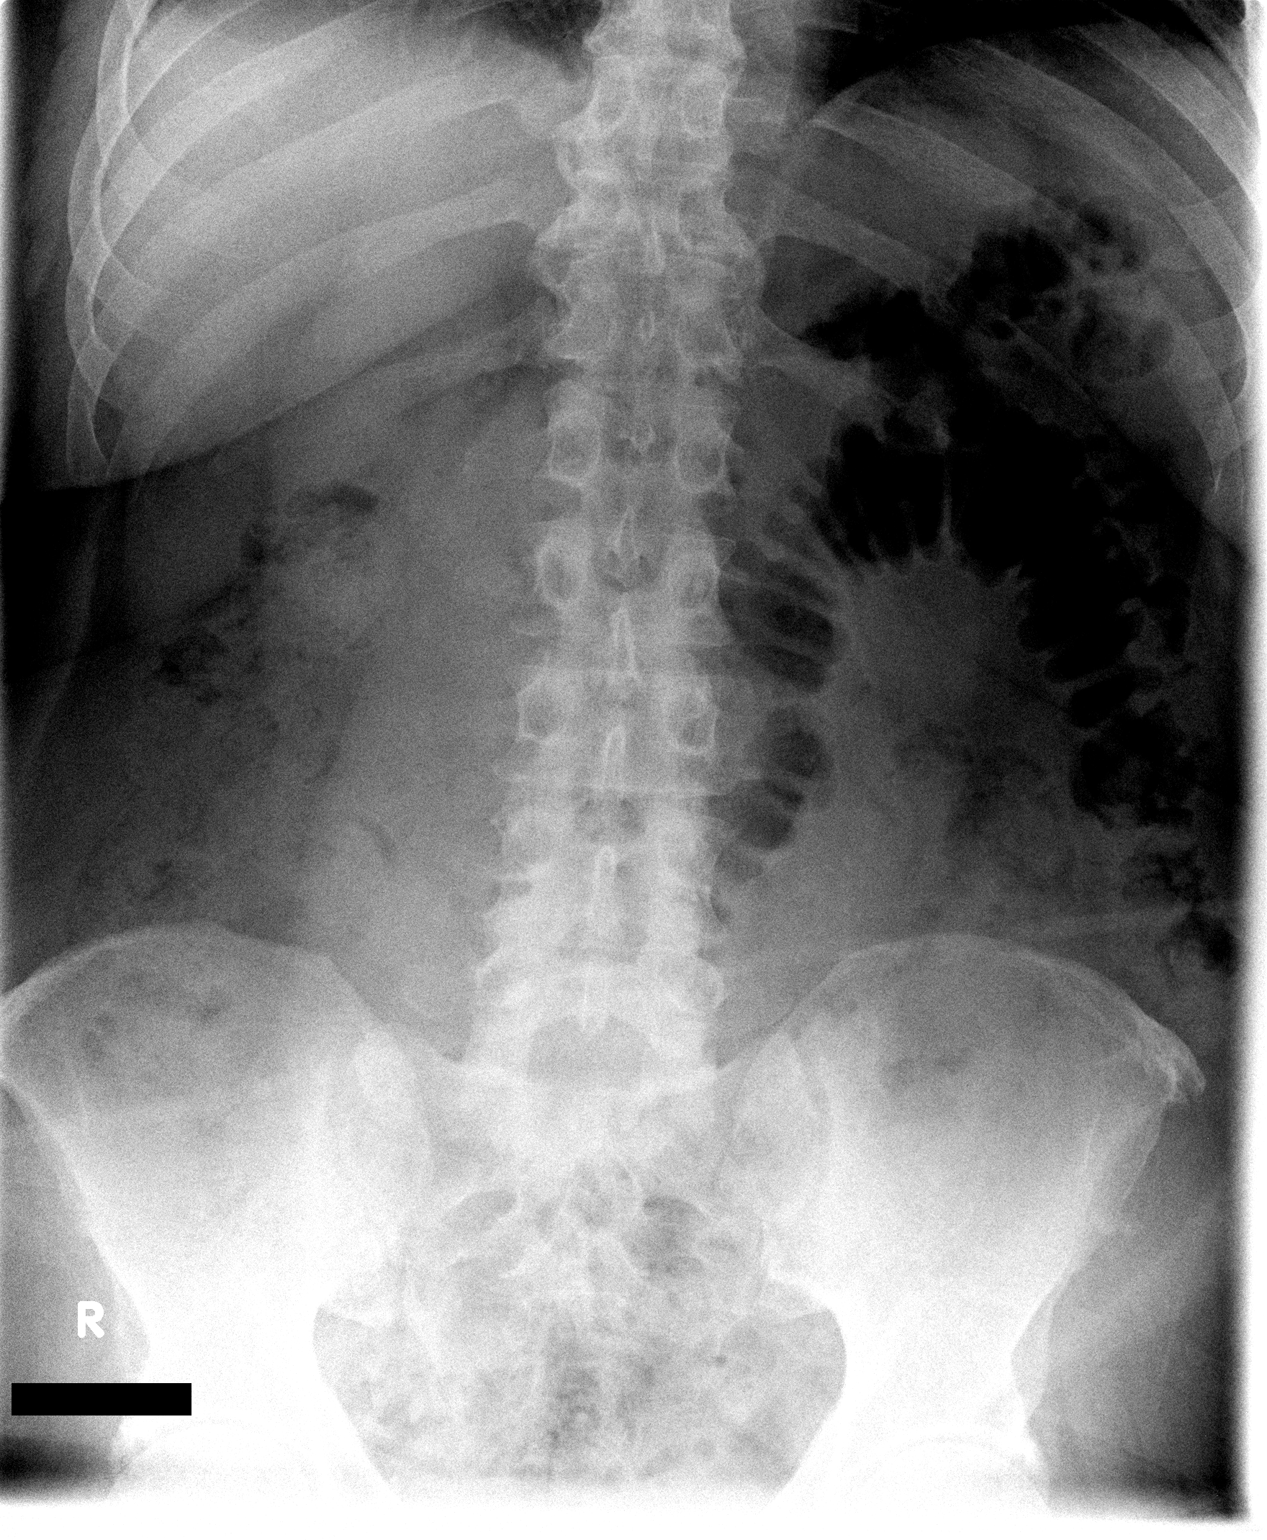

[view not recorded (3 of 3)]
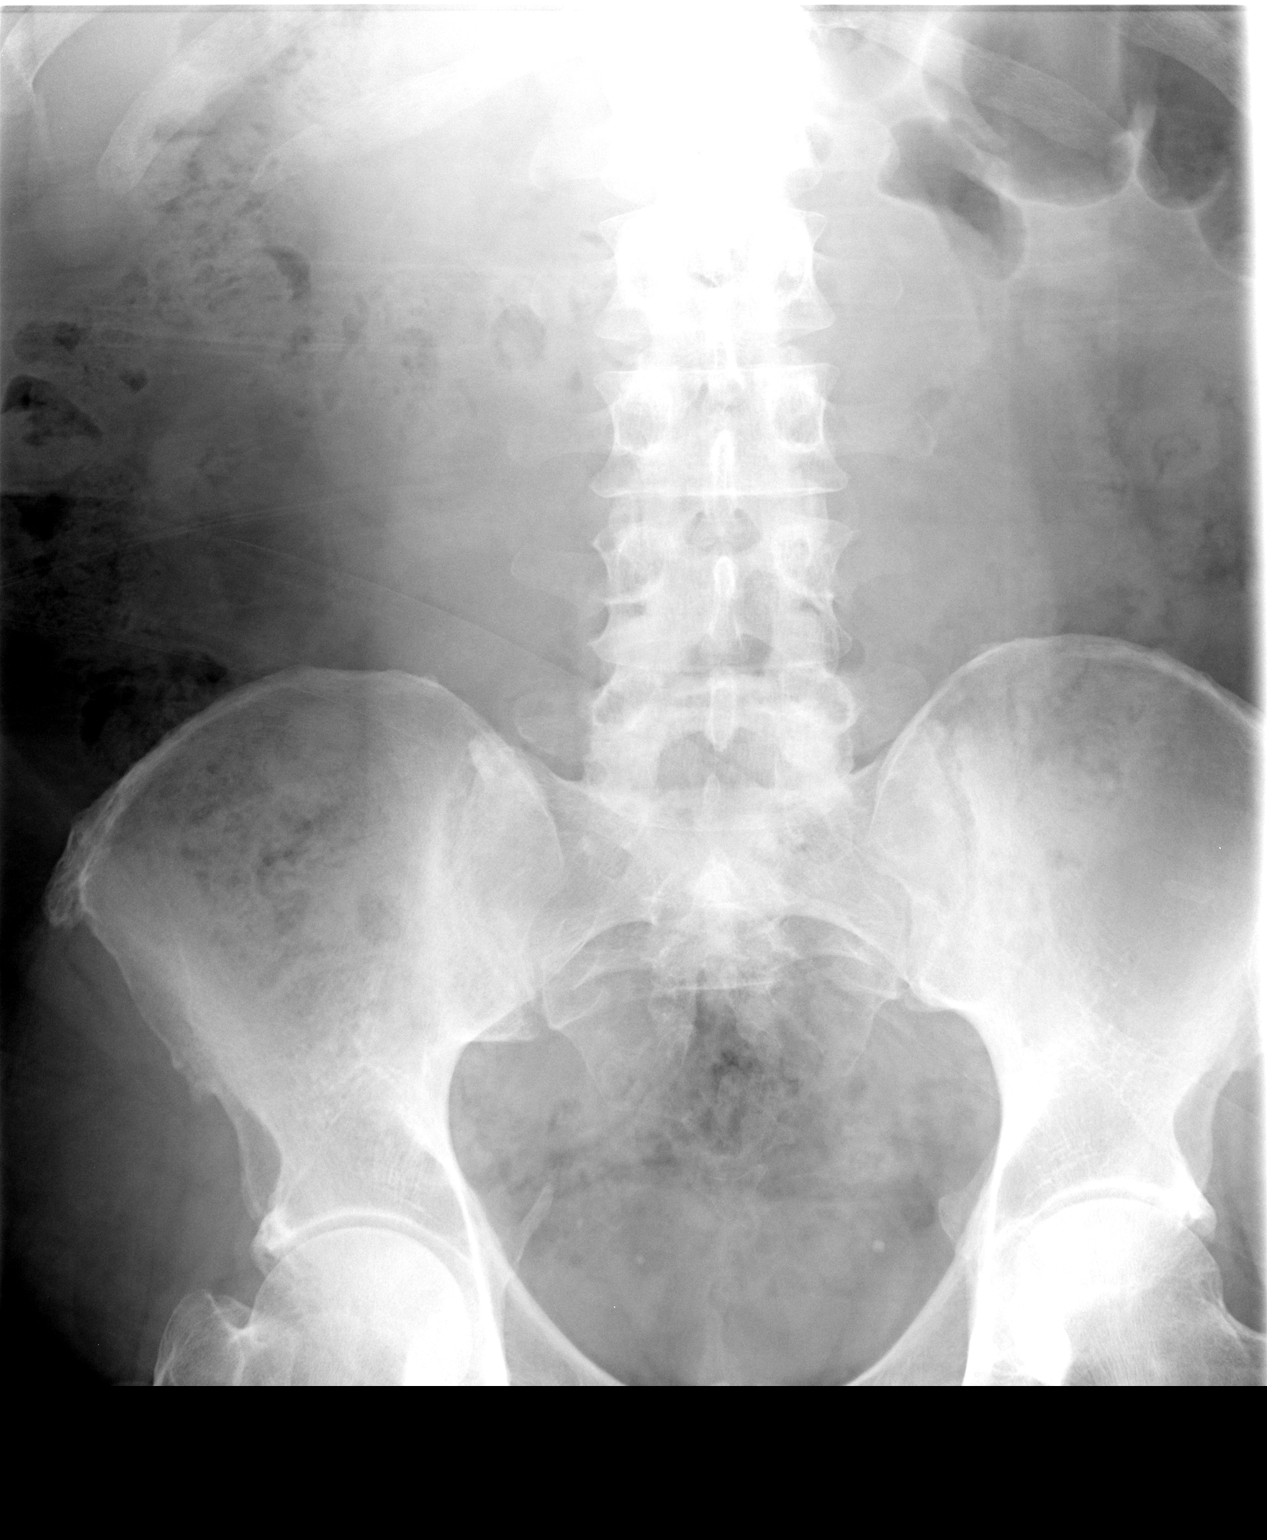

[3 of 3 positions shown; findings below may reference images not displayed]

FINDINGS: Normal sized heart. Clear lungs. Normal bowel gas pattern without
free peritoneal air. Lower lumbar and thoracic spine degenerative
changes. Marked left shoulder degenerative changes.
IMPRESSION: No acute abnormality

## 2014-05-11 ENCOUNTER — Ambulatory Visit: Payer: Medicare Other

## 2014-06-01 ENCOUNTER — Encounter (HOSPITAL_COMMUNITY): Payer: Self-pay

## 2014-06-01 ENCOUNTER — Ambulatory Visit (INDEPENDENT_AMBULATORY_CARE_PROVIDER_SITE_OTHER): Payer: Medicare Other

## 2014-06-01 ENCOUNTER — Ambulatory Visit (HOSPITAL_COMMUNITY): Payer: Medicare Other | Attending: Nurse Practitioner

## 2014-06-01 ENCOUNTER — Ambulatory Visit (HOSPITAL_COMMUNITY): Payer: Medicare Other | Admitting: Physical Therapy

## 2014-06-01 DIAGNOSIS — R29898 Other symptoms and signs involving the musculoskeletal system: Secondary | ICD-10-CM

## 2014-06-01 DIAGNOSIS — M25642 Stiffness of left hand, not elsewhere classified: Secondary | ICD-10-CM | POA: Insufficient documentation

## 2014-06-01 DIAGNOSIS — B351 Tinea unguium: Secondary | ICD-10-CM

## 2014-06-01 DIAGNOSIS — R293 Abnormal posture: Secondary | ICD-10-CM

## 2014-06-01 DIAGNOSIS — I10 Essential (primary) hypertension: Secondary | ICD-10-CM | POA: Insufficient documentation

## 2014-06-01 DIAGNOSIS — M25641 Stiffness of right hand, not elsewhere classified: Secondary | ICD-10-CM | POA: Diagnosis not present

## 2014-06-01 DIAGNOSIS — M79676 Pain in unspecified toe(s): Secondary | ICD-10-CM | POA: Diagnosis not present

## 2014-06-01 DIAGNOSIS — M1A0420 Idiopathic chronic gout, left hand, without tophus (tophi): Secondary | ICD-10-CM | POA: Diagnosis not present

## 2014-06-01 DIAGNOSIS — M109 Gout, unspecified: Secondary | ICD-10-CM

## 2014-06-01 DIAGNOSIS — M1A0410 Idiopathic chronic gout, right hand, without tophus (tophi): Secondary | ICD-10-CM | POA: Diagnosis not present

## 2014-06-01 DIAGNOSIS — E119 Type 2 diabetes mellitus without complications: Secondary | ICD-10-CM | POA: Insufficient documentation

## 2014-06-01 DIAGNOSIS — R262 Difficulty in walking, not elsewhere classified: Secondary | ICD-10-CM

## 2014-06-01 DIAGNOSIS — M6281 Muscle weakness (generalized): Secondary | ICD-10-CM | POA: Insufficient documentation

## 2014-06-01 NOTE — Patient Instructions (Addendum)
ROM: Flexion - Wand   Bring wand directly over head, leading with right side. Reach back until stretch is felt.  Repeat _10-12___ times per set. Do __1__ sets per session. Do __2__ sessions per day.    ROM: Abduction - Wand   Holding wand with left hand palm up, push wand directly out to side, leading with other hand palm down, until stretch is felt. Repeat _10-12___ times per set. Do __1__ sets per session. Do __2__ sessions per day.    ROM: Horizontal Abduction / Adduction - Wand   Keeping both palms down, push right hand across body with other hand. Then pull back across body, keeping arms parallel to floor. Do not allow trunk to twist.  Repeat _10-12___ times per set. Do _1___ sets per session. Do __2__ sessions per day.     ROM: External / Internal Rotation - Wand   Holding wand with left hand palm up, push out from body with other hand, palm down. Keep both elbows bent. . Repeat to other side, leading with same hand. Keep elbows bent. Repeat _10-12___ times per set. Do _1___ sets per session. Do 2____ sessions per day.      Towel Roll Squeeze   With right forearm resting on surface, gently squeeze towel. Repeat 10____ times per set. Do __1__ sets per session. Do __2__ sessions per day.  Copyright  VHI. All rights reserved.   Abduction / Adduction (Active)    With hand flat on table, spread all fingers apart, then bring them together as close as possible. Repeat 10____ times. Do ___2_ sessions per day.  Home Exercises Program Theraputty Exercises  Do the following exercises 2 times a day using your affected hand.  1. Roll putty into a ball.  2. Make into a pancake.  3. Roll putty into a roll.  4. Pinch along log with first finger and thumb.   5. Make into a ball.  6. Roll it back into a log.   7. Pinch using thumb and side of first finger.  8. Roll into a ball, then flatten into a pancake.  9. Using your fingers, make putty into a  mountain.    Coordination Activities  Perform the following activities for 30 minutes 2 times per day with both hand(s).  Rotate ball in fingertips (clockwise and counter-clockwise). Flip cards 1 at a time as fast as you can. Pick up coins, buttons, marbles, dried beans/pasta of different sizes and place in container. Pick up coins and place in container or coin bank.

## 2014-06-01 NOTE — Patient Instructions (Addendum)
Functional Quadriceps: Chair Squat   Keeping feet flat on floor, shoulder width apart, squat as low as is comfortable. Use support as necessary. Repeat __10__ times per set. Do ____1 sets per session. Do ___3_ sessions per day.  http://orth.exer.us/736   Copyright  VHI. All rights reserved.  Knee Extension (Sitting)   Place _3___ pound weight on left ankle and straighten knee fully, lower slowly. Repeat _10___ times per set. Do _1___ sets per session. Do ___2_ sessions per day.  http://orth.exer.us/732   Copyright  VHI. All rights reserved.  Strengthening: Quadriceps Set   Tighten muscles on top of thighs by pushing knees down into surface. Hold _3-5___ seconds. Repeat _10___ times per set. Do __1__ sets per session. Do 3____ sessions per day.  http://orth.exer.us/602   Copyright  VHI. All rights reserved.  Strengthening: Straight Leg Raise (Phase 1)   Tighten muscles on front of right thigh, then lift leg _15___ inches from surface, keeping knee locked.  Repeat _10___ times per set. Do _1___ sets per session. Do __2__ sessions per day. Repeat to left  http://orth.exer.us/614   Copyright  VHI. All rights reserved.  Bridging   Slowly raise buttocks from floor, keeping stomach tight. Repeat __10__ times per set. Do _1___ sets per session. Do ____2 sessions per day.  http://orth.exer.us/1096   Copyright  VHI. All rights reserved.  Straight Leg Raise (Prone)   Abdomen and head supported, keep left knee locked and raise leg at hip. Avoid arching low back. Repeat _10___ times per set. Do _1___ sets per session. Do __2__ sessions per day.  http://orth.exer.us/1112   Copyright  VHI. All rights reserved.

## 2014-06-01 NOTE — Therapy (Signed)
[place], [place], [address] Phone: [phone]   Fax:  [phone]  Occupational Therapy Evaluation  Patient Details  Name: Jack Irwin MRN: [id number] Date of Birth: 14-Apr-1965 Referring Provider:  [name], MD  Encounter Date: 06/01/2014      OT End of Session - 06/01/14 1203    Visit Number 1   Number of Visits 1   Authorization Type Medicare Part A and B primary and Medicaid secondary   Authorization Time Period before 10th visit.    Authorization - Visit Number 1   Authorization - Number of Visits 10   OT Start Time [phone]   OT Stop Time 1017   OT Time Calculation (min) 39 min   Activity Tolerance Patient tolerated treatment well   Behavior During Therapy WFL for tasks assessed/performed      Past Medical History  Diagnosis Date  . Hypertension   . Diabetes mellitus   . Gout   . Chronic back pain   . BPH (benign prostatic hyperplasia)   . Anemia   . DVT (deep venous thrombosis)   . Neuropathy   . Decubitus ulcer     of 2nd toes of both feet.  . Edema leg   . Constipation   . Venous (peripheral) insufficiency   . Neuropathy, diabetic   . Physical deconditioning   . Poor balance   . Difficulty walking   . Lack of coordination   . Arthritis   . Renal insufficiency   . Chronic kidney disease   . Chronic kidney disease (CKD), stage IV (severe)   . Constipation     Past Surgical History  Procedure Laterality Date  . None    . Insertion of dialysis catheter    . Cyst excision Right     cyst removed on thumb 2001  . Insertion of dialysis catheter Left 03/09/2014    Procedure: INSERTION OF DIALYSIS CATHETER;  Surgeon: Angelia Mould, MD;  Location: [facility];  Service: Vascular;  Laterality: Left;  . Av fistula placement Left 03/09/2014    Procedure: INSERTION OF ARTERIOVENOUS (AV) GORE-TEX GRAFT ARM;  Surgeon: Angelia Mould, MD;  Location: [facility];  Service: Vascular;   Laterality: Left;    There were no vitals filed for this visit.  Visit Diagnosis:  Arthritis, gouty - Plan: Ot plan of care cert/re-cert      Subjective Assessment - 06/01/14 1144    Subjective  S: I just want to be able to grip things and hold onto things without dropping them.    Pertinent History Patient is a 49 y/o male that is well known to this clinic. Patient presents to therapy clinic for bilateral hand weakness due to gout. Pt reports that he has started diaylisis approx. 3 months ago. Dr. Izola Price has referred patient to  occupational therapy for evaluation and treatment.    Patient Stated Goals To increase grip strength in hands.    Currently in Pain? Yes   Pain Score 7    Pain Location Hand   Pain Orientation Right;Left   Pain Descriptors / Indicators Aching   Pain Type Chronic pain   Pain Onset More than a month ago   Pain Frequency Constant           [facility] OT Assessment - 06/01/14 0942    Assessment   Diagnosis Bilateral hand weakness   Prior Therapy AP OP   Precautions   Precautions None  Restrictions   Weight Bearing Restrictions No   Balance Screen   Has the patient fallen in the past 6 months No   Home  Environment   Family/patient expects to be discharged to: Private residence   Prior Function   Level of Independence Needs assistance with ADLs;Needs assistance with homemaking   Vocation On disability   Leisure Would like to start walking.   ADL   ADL comments Pt needs assistance with buttons and zippers. Patient has difficulty turning door knobs, sink faucets, and writing due to gout.    Written Expression   Dominant Hand Right   Vision - History   Baseline Vision No visual deficits   Cognition   Overall Cognitive Status Within Functional Limits for tasks assessed   Observation/Other Assessments   Observations Patient with severe joint deformities in bilateral hands from gout.    Sensation   Additional Comments Pt reports numbness in both  hands.    Coordination   Fine Motor Movements are Fluid and Coordinated No   Coordination and Movement Description Pt has increased difficulty with fine motor activities due to bilateral hand deformities resulting from gout.    9 Hole Peg Test Right;Left   Right 9 Hole Peg Test 28.5"   Left 9 Hole Peg Test 1'13"   ROM / Strength   AROM / PROM / Strength AROM;Strength   AROM   Overall AROM Comments AROM of bilateral shoulder limited to approx. 45% range.   AROM Assessment Site Shoulder;Elbow   Right/Left Shoulder Right;Left   Strength   Overall Strength Comments Assessed seated. IR/ER adducted.   Strength Assessment Site Shoulder   Right/Left Shoulder Right;Left   Right Shoulder Flexion 3+/5   Right Shoulder ABduction 3+/5   Right Shoulder Internal Rotation 4-/5   Right Shoulder External Rotation 4-/5   Left Shoulder Flexion 3-/5   Left Shoulder ABduction 3-/5   Left Shoulder Internal Rotation 3+/5   Left Shoulder External Rotation 3+/5   Hand Function   Right Hand Grip (lbs) 20   Right Hand Lateral Pinch 15 lbs   Right Hand 3 Point Pinch 10 lbs   Left Hand Grip (lbs) 15   Left Hand Lateral Pinch 12 lbs   Left 3 point pinch 4 lbs                         OT Education - 06/01/14 1202    Education provided Yes   Education Details pink theraputty exercises, coordination activities, AAROM shoulder exercises, Gout education, small square of shelf liner to increase ability to turn sink faucets.    Person(s) Educated Patient   Methods Explanation;Demonstration;Handout   Comprehension Verbalized understanding;Returned demonstration          OT Short Term Goals - 06/01/14 1212    OT SHORT TERM GOAL #1   Title Patient will be educated on HEP.   Time 1   Period Days   Status Achieved                  Plan - 06/01/14 1206    Clinical Impression Statement A: Patient is a 49 y/o male presenting to occupational therapy for bilateral hand weakness  secondary to gout causing increased weakness, numbness, and ROM. Patient's hand strength was assessed and his his grip strength was decreased from last time he was seen for therapy. Unfortunately, due to his severe gout and hand deformity I do not see patient making any significant  gains with grip strength. Pinch strength remains the same for bilateral hands. Pt did show signs up LUE weakness and decreased ROM in which he was given AAROM shoulder exercises to complete as an HEP. Patient was given reprints of theraputty exercises and coordination activities that he was given the last time at clinic. Additional OT services not recommended at this time.    Pt will benefit from skilled therapeutic intervention in order to improve on the following deficits (Retired) Decreased strength   Rehab Potential Excellent   OT Frequency 1x / week   OT Duration --  1 week   OT Treatment/Interventions Patient/family education   Plan P: 1 time eval with HEP.   Consulted and Agree with Plan of Care Patient          G-Codes - 15-Jun-2014 1213    Functional Assessment Tool Used Grip strength    Functional Limitation Carrying, moving and handling objects   Carrying, Moving and Handling Objects Current Status [phone]) At least 80 percent but less than 100 percent impaired, limited or restricted   Carrying, Moving and Handling Objects Goal Status [id number]) At least 80 percent but less than 100 percent impaired, limited or restricted   Carrying, Moving and Handling Objects Discharge Status [phone]) At least 80 percent but less than 100 percent impaired, limited or restricted      Problem List Patient Active Problem List   Diagnosis Date Noted  . Thrombocytopenia 02/21/2014  . Morbid obesity 03/30/2013  . RBBB 03/29/2013  . Hyperkalemia 03/28/2013  . Leukocytosis 03/28/2013  . Nausea vomiting and diarrhea 03/28/2013  . BBB (bundle branch block) 03/28/2013  . Hypertension   . ESRD on dialysis   . Lack of  coordination 04/15/2012  . Muscle weakness (generalized) 04/15/2012  . Arthritis, gouty 04/15/2012  . Difficulty in walking(719.7) 08/18/2011  . Weakness of both legs 08/18/2011  . Poor balance 08/18/2011    Ailene Ravel, OTR/L,CBIS  [phone]  06/15/2014, 12:21 PM  [facility] [address] [place], [place], [address] Phone: [phone]   Fax:  [phone]

## 2014-06-01 NOTE — Therapy (Signed)
[name], [place], [address] Phone: [phone]   Fax:  [phone]  Physical Therapy Evaluation  Patient Details  Name: Jack Irwin MRN: [id number] Date of Birth: [birth year] Referring Provider:  [name], MD  Encounter Date: 06/01/2014      PT End of Session - 06/01/14 1205    Visit Number 1   Number of Visits 12   Date for PT Re-Evaluation 07/31/14   Authorization - Visit Number 1   Authorization - Number of Visits 10   PT Start Time [id number]   PT Stop Time 1100   PT Time Calculation (min) 37 min   Activity Tolerance Patient tolerated treatment well      Past Medical History  Diagnosis Date  . Hypertension   . Diabetes mellitus   . Gout   . Chronic back pain   . BPH (benign prostatic hyperplasia)   . Anemia   . DVT (deep venous thrombosis)   . Neuropathy   . Decubitus ulcer     of 2nd toes of both feet.  . Edema leg   . Constipation   . Venous (peripheral) insufficiency   . Neuropathy, diabetic   . Physical deconditioning   . Poor balance   . Difficulty walking   . Lack of coordination   . Arthritis   . Renal insufficiency   . Chronic kidney disease   . Chronic kidney disease (CKD), stage IV (severe)   . Constipation     Past Surgical History  Procedure Laterality Date  . None    . Insertion of dialysis catheter    . Cyst excision Right     cyst removed on thumb 2001  . Insertion of dialysis catheter Left 03/09/2014    Procedure: INSERTION OF DIALYSIS CATHETER;  Surgeon: Angelia Mould, MD;  Location: [facility];  Service: Vascular;  Laterality: Left;  . Av fistula placement Left 03/09/2014    Procedure: INSERTION OF ARTERIOVENOUS (AV) GORE-TEX GRAFT ARM;  Surgeon: Angelia Mould, MD;  Location: [facility];  Service: Vascular;  Laterality: Left;    There were no vitals filed for this visit.  Visit Diagnosis:  Difficulty walking  Leg weakness, bilateral  Abnormal  posture      Subjective Assessment - 06/01/14 1050    Subjective Mr. Nevius states that he would like to be able to walk longer without giving out.  He walks with a forward bent position,.  He has been referred to therapy to help improve his gait so that he can have increased activity tolerance. Pt states he has a wound on his Rt second toe which has been therer for the past four months,(pt is seeing a podiatrist for this)             [facility] PT Assessment - 06/01/14 0958    Assessment   Medical Diagnosis Difficulty walking   Onset Date 03/02/14  when pt started dialysis    Prior Therapy 2014   Precautions   Precautions Other (comment)   Precaution Comments --  avoid heel raises pt has a wound on second toe on Rt foot.   Balance Screen   Has the patient fallen in the past 6 months No   Has the patient had a decrease in activity level because of a fear of falling?  No   Is the patient reluctant to leave their home because of a fear of falling?  No   Home Environment  Living Enviornment Private residence   Type of [place] to enter   Entrance Stairs-Number of Steps 7  walks down steps backwards    Entrance Stairs-Rails Can reach both   Home Layout One level   Prior Function   Level of Independence Needs assistance with homemaking   Vocation On disability   Leisure none now but would like to start walking    Observation/Other Assessments   Observations Pt ambulates with approximate 30 degree forward bend from waist.    Focus on Therapeutic Outcomes (FOTO)  54   Single Leg Stance   Comments 2 seconds Rt; 3 seconds Lt  normal for age and sex is 30 seconds    Sit to Stand   Comments only able to complete in 28 seconds without UE assist. with UE assist abel to complete 5 in 23.6 seconds.  normal 5 sit to stand for age 25-69 is 8.4 seconds    AROM   Right Knee Extension 23   Left Knee Extension 12   Strength   Right Hip Flexion 3/5   Right Hip  ABduction 4/5   Left Hip Flexion 5/5   Left Hip ABduction 4/5   Right Knee Extension 3+/5   Left Knee Extension 4+/5   Right Ankle Dorsiflexion 5/5   Right Ankle Plantar Flexion 3-/5   Left Ankle Dorsiflexion 5/5   Left Ankle Plantar Flexion 3-/5                   [facility] Adult PT Treatment/Exercise - 06/01/14 0001    Exercises   Exercises Knee/Hip   Knee/Hip Exercises: Seated   Long Arc Quad Strengthening;Both;5 reps   Knee/Hip Exercises: Supine   Quad Sets Both;10 reps   Bridges 10 reps   Straight Leg Raises Strengthening;Both;5 reps   Knee/Hip Exercises: Prone   Hip Extension Both;10 reps                PT Education - 06/01/14 1205    Education provided Yes   Education Details HEP for strengthening    Person(s) Educated Patient   Methods Explanation   Comprehension Verbalized understanding;Returned demonstration          PT Short Term Goals - 06/01/14 1207    PT SHORT TERM GOAL #1   Title I HEP   Time 1   Period Weeks   Status New   PT SHORT TERM GOAL #2   Title Pt to be able to stand for 2 minutes without sitting down without UE support    Time 2   Period Weeks   PT SHORT TERM GOAL #3   Title Pt to be able to walk for five minutes without resting    Time 2   Period Weeks   PT SHORT TERM GOAL #4   Title Pt to be able to stand  with no greater than a 20 degree forward bend    Time 2   Period Weeks   PT SHORT TERM GOAL #5   Title SLS to be 8 seconds or greater to reduce risk of falling    Time 3   Period Weeks           PT Long Term Goals - 06/01/14 1225    PT LONG TERM GOAL #1   Title I in advance HEP   Time 6   Period Weeks   Status New   PT LONG TERM GOAL #2   Title Pt  to be able to stand for ten minutes to be able to make a small meal    Time 6   Period Weeks   Status New   PT LONG TERM GOAL #3   Title Pt to be able to walk for 15 minutes for a healthy life style   Time 6   Period Weeks   Status New   PT LONG TERM  GOAL #4   Title Pt to be walking with no more than a 15 degree forward bend for better balance   Time 6   Period Weeks   Status New   PT LONG TERM GOAL #5   Title Pt to be able to SLS for 12 seconds or greater to reduce risk of falling    Time 6   Period Weeks   Status New   Additional Long Term Goals   Additional Long Term Goals Yes   PT LONG TERM GOAL #6   Title Pt to be able to go down steps forward safely   Time 6   Period Weeks               Plan - Jul 01, 2014 1218    Clinical Impression Statement Mr. Paduano is a 49 yo male who has gout in B feet, stage IV kidney failure and DM with a chronic wound on his Rt second toe,(pt is seeing a podiatrist for toe wound.  Therapist told pt to tell podiatrist that we could see pt for wound care on the toe if the podiatrist so desires).  Pt main concern and has been referred to physcial therapy at this time is that ever since he started dialysis in January he feels that he has been getting weaker.  He notes that  he is unable to stand or walk as long as he use to and he is unable to stand up as erect as he once could;(pt has not been able to stand totally erect for many years but was doing better than he is at this time).  Examination shows, decrased core and LE strength, decreased balance.  Mr. Hibdon will bnefit from skilled therapy to address these issures and maximze his functional potential.    Pt will benefit from skilled therapeutic intervention in order to improve on the following deficits Decreased activity tolerance;Decreased balance;Decreased endurance;Decreased range of motion;Decreased strength;Difficulty walking;Pain   Rehab Potential Good   PT Frequency 2x / week   PT Duration 6 weeks   PT Treatment/Interventions Gait training;Functional mobility training;Therapeutic activities;Stair training;Therapeutic exercise;Balance training;Patient/family education   PT Next Visit Plan Pt to begin functional squat; standing with no UE  assist, Marching, forward and lateral step ups, standing at wall pulling shoulders back, progress to sidestepping and lunging    PT Home Exercise Plan given   Consulted and Agree with Plan of Care Patient          G-Codes - Jul 01, 2014 1215    Functional Assessment Tool Used foto   Functional Limitation Mobility: Walking and moving around   Mobility: Walking and Moving Around Current Status [phone]) At least 40 percent but less than 60 percent impaired, limited or restricted   Mobility: Walking and Moving Around Goal Status [phone]) At least 40 percent but less than 60 percent impaired, limited or restricted       Problem List Patient Active Problem List   Diagnosis Date Noted  . Thrombocytopenia 02/21/2014  . Morbid obesity 03/30/2013  . RBBB 03/29/2013  . Hyperkalemia 03/28/2013  . Leukocytosis  03/28/2013  . Nausea vomiting and diarrhea 03/28/2013  . BBB (bundle branch block) 03/28/2013  . Hypertension   . ESRD on dialysis   . Lack of coordination 04/15/2012  . Muscle weakness (generalized) 04/15/2012  . Arthritis, gouty 04/15/2012  . Difficulty in walking(719.7) 08/18/2011  . Weakness of both legs 08/18/2011  . Poor balance 08/18/2011   Rayetta Humphrey, PT CLT [phone] 06/01/2014, 12:26 PM  [facility] [address] [place], [place], [address] Phone: [phone]   Fax:  [phone]

## 2014-06-06 NOTE — Addendum Note (Signed)
Addended by: Leeroy Cha on: 06/06/2014 03:52 PM   Modules accepted: Orders

## 2014-06-06 NOTE — Progress Notes (Signed)
HPI Presents today chief complaint of painful elongated toenails.  Objective: Pulses are palpable bilateral nails are thick, yellow dystrophic onychomycosis and painful palpation.   Assessment: Onychomycosis with pain in limb.  Plan: Treatment of nails in thickness and length as covered service secondary to pain.  

## 2014-06-19 ENCOUNTER — Telehealth (HOSPITAL_COMMUNITY): Payer: Self-pay

## 2014-06-19 NOTE — Telephone Encounter (Signed)
Patient canceled his appt. He think he may have a blood clot in his arm and is going to see his MD.

## 2014-06-20 ENCOUNTER — Telehealth (HOSPITAL_COMMUNITY): Payer: Self-pay | Admitting: Physical Therapy

## 2014-06-20 ENCOUNTER — Ambulatory Visit (HOSPITAL_COMMUNITY): Payer: Medicare Other | Attending: Nurse Practitioner | Admitting: Physical Therapy

## 2014-06-20 DIAGNOSIS — I10 Essential (primary) hypertension: Secondary | ICD-10-CM | POA: Insufficient documentation

## 2014-06-20 DIAGNOSIS — M25641 Stiffness of right hand, not elsewhere classified: Secondary | ICD-10-CM | POA: Insufficient documentation

## 2014-06-20 DIAGNOSIS — E119 Type 2 diabetes mellitus without complications: Secondary | ICD-10-CM | POA: Insufficient documentation

## 2014-06-20 DIAGNOSIS — M6281 Muscle weakness (generalized): Secondary | ICD-10-CM | POA: Insufficient documentation

## 2014-06-20 DIAGNOSIS — M25642 Stiffness of left hand, not elsewhere classified: Secondary | ICD-10-CM | POA: Insufficient documentation

## 2014-06-20 DIAGNOSIS — M1A0410 Idiopathic chronic gout, right hand, without tophus (tophi): Secondary | ICD-10-CM | POA: Insufficient documentation

## 2014-06-20 DIAGNOSIS — M1A0420 Idiopathic chronic gout, left hand, without tophus (tophi): Secondary | ICD-10-CM | POA: Insufficient documentation

## 2014-06-20 NOTE — Telephone Encounter (Signed)
Patient called by therapist due to missed appointment. Patient did not answer phone and no voicemail was left due to patient not having voice mail or answering machine.

## 2014-06-22 ENCOUNTER — Ambulatory Visit (HOSPITAL_COMMUNITY): Payer: Medicare Other

## 2014-06-29 ENCOUNTER — Ambulatory Visit (HOSPITAL_COMMUNITY): Payer: Medicare Other | Admitting: Physical Therapy

## 2014-07-04 ENCOUNTER — Encounter (HOSPITAL_COMMUNITY): Payer: Self-pay | Admitting: Physical Therapy

## 2014-07-06 ENCOUNTER — Encounter (HOSPITAL_COMMUNITY): Payer: Self-pay | Admitting: Physical Therapy

## 2014-07-11 ENCOUNTER — Emergency Department (HOSPITAL_COMMUNITY)
Admission: EM | Admit: 2014-07-11 | Discharge: 2014-07-11 | Disposition: A | Discharge status: Home / Self Care | Payer: Medicare Other | Attending: Emergency Medicine | Admitting: Emergency Medicine

## 2014-07-11 ENCOUNTER — Encounter (HOSPITAL_COMMUNITY): Payer: Self-pay | Admitting: Emergency Medicine

## 2014-07-11 ENCOUNTER — Encounter (HOSPITAL_COMMUNITY): Payer: Self-pay | Admitting: Physical Therapy

## 2014-07-11 DIAGNOSIS — I120 Hypertensive chronic kidney disease with stage 5 chronic kidney disease or end stage renal disease: Secondary | ICD-10-CM | POA: Insufficient documentation

## 2014-07-11 DIAGNOSIS — Z792 Long term (current) use of antibiotics: Secondary | ICD-10-CM | POA: Insufficient documentation

## 2014-07-11 DIAGNOSIS — Z79899 Other long term (current) drug therapy: Secondary | ICD-10-CM | POA: Insufficient documentation

## 2014-07-11 DIAGNOSIS — Z86718 Personal history of other venous thrombosis and embolism: Secondary | ICD-10-CM | POA: Diagnosis not present

## 2014-07-11 DIAGNOSIS — Z862 Personal history of diseases of the blood and blood-forming organs and certain disorders involving the immune mechanism: Secondary | ICD-10-CM | POA: Insufficient documentation

## 2014-07-11 DIAGNOSIS — Z791 Long term (current) use of non-steroidal anti-inflammatories (NSAID): Secondary | ICD-10-CM | POA: Insufficient documentation

## 2014-07-11 DIAGNOSIS — E1140 Type 2 diabetes mellitus with diabetic neuropathy, unspecified: Secondary | ICD-10-CM | POA: Diagnosis not present

## 2014-07-11 DIAGNOSIS — E11649 Type 2 diabetes mellitus with hypoglycemia without coma: Secondary | ICD-10-CM | POA: Diagnosis not present

## 2014-07-11 DIAGNOSIS — Z872 Personal history of diseases of the skin and subcutaneous tissue: Secondary | ICD-10-CM | POA: Insufficient documentation

## 2014-07-11 DIAGNOSIS — M109 Gout, unspecified: Secondary | ICD-10-CM | POA: Insufficient documentation

## 2014-07-11 DIAGNOSIS — M1990 Unspecified osteoarthritis, unspecified site: Secondary | ICD-10-CM | POA: Diagnosis not present

## 2014-07-11 DIAGNOSIS — Z992 Dependence on renal dialysis: Secondary | ICD-10-CM | POA: Diagnosis not present

## 2014-07-11 DIAGNOSIS — N186 End stage renal disease: Secondary | ICD-10-CM | POA: Insufficient documentation

## 2014-07-11 DIAGNOSIS — G8929 Other chronic pain: Secondary | ICD-10-CM | POA: Diagnosis not present

## 2014-07-11 DIAGNOSIS — E162 Hypoglycemia, unspecified: Secondary | ICD-10-CM

## 2014-07-11 DIAGNOSIS — R5383 Other fatigue: Secondary | ICD-10-CM | POA: Diagnosis present

## 2014-07-11 LAB — CBC WITH DIFFERENTIAL/PLATELET
Basophils Absolute: 0 10*3/uL (ref 0.0–0.1)
Basophils Relative: 0 % (ref 0–1)
Eosinophils Absolute: 0.1 10*3/uL (ref 0.0–0.7)
Eosinophils Relative: 1 % (ref 0–5)
HCT: 41.1 % (ref 39.0–52.0)
Hemoglobin: 13.3 g/dL (ref 13.0–17.0)
Lymphocytes Relative: 34 % (ref 12–46)
Lymphs Abs: 3.6 10*3/uL (ref 0.7–4.0)
MCH: 27.7 pg (ref 26.0–34.0)
MCHC: 32.4 g/dL (ref 30.0–36.0)
MCV: 85.6 fL (ref 78.0–100.0)
MONOS PCT: 8 % (ref 3–12)
Monocytes Absolute: 0.9 10*3/uL (ref 0.1–1.0)
Neutro Abs: 6 10*3/uL (ref 1.7–7.7)
Neutrophils Relative %: 57 % (ref 43–77)
Platelets: 150 10*3/uL (ref 150–400)
RBC: 4.8 MIL/uL (ref 4.22–5.81)
RDW: 19 % — ABNORMAL HIGH (ref 11.5–15.5)
WBC: 10.6 10*3/uL — ABNORMAL HIGH (ref 4.0–10.5)

## 2014-07-11 LAB — CBG MONITORING, ED
GLUCOSE-CAPILLARY: 84 mg/dL (ref 65–99)
Glucose-Capillary: 64 mg/dL — ABNORMAL LOW (ref 65–99)

## 2014-07-11 LAB — COMPREHENSIVE METABOLIC PANEL
ALT: 18 U/L (ref 17–63)
AST: 27 U/L (ref 15–41)
Albumin: 4 g/dL (ref 3.5–5.0)
Alkaline Phosphatase: 73 U/L (ref 38–126)
Anion gap: 15 (ref 5–15)
BILIRUBIN TOTAL: 0.8 mg/dL (ref 0.3–1.2)
BUN: 66 mg/dL — ABNORMAL HIGH (ref 6–20)
CO2: 27 mmol/L (ref 22–32)
Calcium: 9.2 mg/dL (ref 8.9–10.3)
Chloride: 97 mmol/L — ABNORMAL LOW (ref 101–111)
Creatinine, Ser: 9.76 mg/dL — ABNORMAL HIGH (ref 0.61–1.24)
GFR calc Af Amer: 7 mL/min — ABNORMAL LOW (ref >60)
GFR calc non Af Amer: 6 mL/min — ABNORMAL LOW (ref >60)
Glucose, Bld: 77 mg/dL (ref 65–99)
Potassium: 4.5 mmol/L (ref 3.5–5.1)
Sodium: 139 mmol/L (ref 135–145)
TOTAL PROTEIN: 8.2 g/dL — AB (ref 6.5–8.1)

## 2014-07-11 NOTE — ED Notes (Signed)
Pt given ginger ale and crackers with peanut butter. 

## 2014-07-11 NOTE — ED Notes (Addendum)
PT c/o generalized weakness, fatigue, nausea and upper abdominal pain starting this am. PT on dialysis M/W/F at [facility] in [place] and reports full treatment on Monday.

## 2014-07-11 NOTE — Discharge Instructions (Signed)
Eat 3 meals a day.  Follow up with your md if any problems

## 2014-07-11 NOTE — ED Notes (Signed)
Lab at bedside

## 2014-07-11 NOTE — ED Provider Notes (Signed)
CSN: [id number]     Arrival date & time 07/11/14  1630 History   First MD Initiated Contact with Patient 07/11/14 1649     Chief Complaint  Patient presents with  . Fatigue     (Consider location/radiation/quality/duration/timing/severity/associated sxs/prior Treatment) Patient is a 49 y.o. male presenting with weakness. The history is provided by the patient (the pt complains of some weakness with low glucose.  he has not eaten lunch today).  Weakness This is a new problem. The current episode started 6 to 12 hours ago. The problem occurs rarely. The problem has been resolved. Pertinent negatives include no chest pain, no abdominal pain and no headaches. Nothing aggravates the symptoms. Nothing relieves the symptoms.    Past Medical History  Diagnosis Date  . Hypertension   . Diabetes mellitus   . Gout   . Chronic back pain   . BPH (benign prostatic hyperplasia)   . Anemia   . DVT (deep venous thrombosis)   . Neuropathy   . Decubitus ulcer     of 2nd toes of both feet.  . Edema leg   . Constipation   . Venous (peripheral) insufficiency   . Neuropathy, diabetic   . Physical deconditioning   . Poor balance   . Difficulty walking   . Lack of coordination   . Arthritis   . Renal insufficiency   . Chronic kidney disease   . Chronic kidney disease (CKD), stage IV (severe)   . Constipation    Past Surgical History  Procedure Laterality Date  . None    . Insertion of dialysis catheter    . Cyst excision Right     cyst removed on thumb 2001  . Insertion of dialysis catheter Left 03/09/2014    Procedure: INSERTION OF DIALYSIS CATHETER;  Surgeon: Angelia Mould, MD;  Location: [facility];  Service: Vascular;  Laterality: Left;  . Av fistula placement Left 03/09/2014    Procedure: INSERTION OF ARTERIOVENOUS (AV) GORE-TEX GRAFT ARM;  Surgeon: Angelia Mould, MD;  Location: [facility] OR;  Service: Vascular;  Laterality: Left;   Family History  Problem Relation Age of Onset  .  Diabetes Mother   . Hypertension Mother   . Heart failure Mother   . Hyperlipidemia Mother   . Heart attack Mother   . Cancer Father   . Diabetes Father   . Hypertension Father   . Hyperlipidemia Father    History  Substance Use Topics  . Smoking status: Never Smoker   . Smokeless tobacco: Never Used  . Alcohol Use: No    Review of Systems  Constitutional: Negative for appetite change and fatigue.  HENT: Negative for congestion, ear discharge and sinus pressure.   Eyes: Negative for discharge.  Respiratory: Negative for cough.   Cardiovascular: Negative for chest pain.  Gastrointestinal: Negative for abdominal pain and diarrhea.  Genitourinary: Negative for frequency and hematuria.  Musculoskeletal: Negative for back pain.  Skin: Negative for rash.  Neurological: Positive for weakness. Negative for seizures and headaches.  Psychiatric/Behavioral: Negative for hallucinations.      Allergies  Review of patient's allergies indicates no known allergies.  Home Medications   Prior to Admission medications   Medication Sig Start Date End Date Taking? Authorizing Provider  calcium acetate (PHOSLO) 667 MG capsule Take 1 capsule by mouth 3 (three) times daily. 04/11/14   Historical Provider, MD  colchicine 0.6 MG tablet Take 0.5 tablets (0.3 mg total) by mouth 2 (two) times a week. Take  0.6mg  (one tablet) by mouth every 1-2 hours until one of the following occurs: 1.  The pain is gone 2.  The maximum dose has been given ( no more than 3 tabs in 3 hours or 10 tabs in 24 hours) 3.  The side effects outweight the benefits 04/12/14   Noemi Chapel, MD  diclofenac sodium (VOLTAREN) 1 % GEL Apply to left wrist and hand three times daily 04/13/14   Lily Kocher, PA-C  febuxostat (ULORIC) 40 MG tablet Take 3 tablets (120 mg total) by mouth daily. 10/04/13   Rolland Porter, MD  HYDROcodone-acetaminophen (NORCO/VICODIN) 5-325 MG per tablet Take 1-2 tablets by mouth every 4 (four) hours as needed.  04/13/14   Lily Kocher, PA-C  ondansetron (ZOFRAN ODT) 4 MG disintegrating tablet 4mg  ODT q4 hours prn nausea/vomit 02/16/14   Milton Ferguson, MD  oxyCODONE-acetaminophen (PERCOCET) 5-325 MG per tablet Take 1-2 tablets by mouth every 4 (four) hours as needed for moderate pain. 03/09/14   Samantha J Rhyne, PA-C  penicillin v potassium (VEETID) 500 MG tablet Take 1 tablet (500 mg total) by mouth 3 (three) times daily. 03/03/14   Fredia Sorrow, MD  sildenafil (VIAGRA) 50 MG tablet Take 50 mg by mouth daily as needed for erectile dysfunction.    Historical Provider, MD  traMADol (ULTRAM) 50 MG tablet Take 1 tablet (50 mg total) by mouth every 6 (six) hours as needed. 12/13/13   Glendell Docker, NP   BP 108/88 mmHg  Pulse 107  Temp(Src) 98.1 F (36.7 C)  Resp 16  Ht 5\' 11"  (1.803 m)  Wt 260 lb (117.935 kg)  BMI 36.28 kg/m2  SpO2 100% Physical Exam  Constitutional: He is oriented to person, place, and time. He appears well-developed.  HENT:  Head: Normocephalic.  Eyes: Conjunctivae and EOM are normal. No scleral icterus.  Neck: Neck supple. No thyromegaly present.  Cardiovascular: Normal rate and regular rhythm.  Exam reveals no gallop and no friction rub.   No murmur heard. Pulmonary/Chest: No stridor. He has no wheezes. He has no rales. He exhibits no tenderness.  Abdominal: He exhibits no distension. There is no tenderness. There is no rebound.  Musculoskeletal: Normal range of motion. He exhibits no edema.  Lymphadenopathy:    He has no cervical adenopathy.  Neurological: He is oriented to person, place, and time. He exhibits normal muscle tone. Coordination normal.  Skin: No rash noted. No erythema.  Psychiatric: He has a normal mood and affect. His behavior is normal.    ED Course  Procedures (including critical care time) Labs Review Labs Reviewed  CBC WITH DIFFERENTIAL/PLATELET - Abnormal; Notable for the following:    WBC 10.6 (*)    RDW 19.0 (*)    All other components  within normal limits  COMPREHENSIVE METABOLIC PANEL - Abnormal; Notable for the following:    Chloride 97 (*)    BUN 66 (*)    Creatinine, Ser 9.76 (*)    Total Protein 8.2 (*)    GFR calc non Af Amer 6 (*)    GFR calc Af Amer 7 (*)    All other components within normal limits  CBG MONITORING, ED - Abnormal; Notable for the following:    Glucose-Capillary 64 (*)    All other components within normal limits  CBG MONITORING, ED    Imaging Review No results found.   EKG Interpretation None      MDM   Final diagnoses:  Hypoglycemia    Pt improved after eating  a meal.  He is to make sure he eats 3 times a day and follow up with his md    Milton Ferguson, MD 07/11/14 [phone]

## 2014-07-13 ENCOUNTER — Encounter (HOSPITAL_COMMUNITY): Payer: Self-pay | Admitting: Physical Therapy

## 2014-07-24 ENCOUNTER — Ambulatory Visit (HOSPITAL_COMMUNITY): Payer: Medicare Other | Admitting: Physical Therapy

## 2014-07-24 ENCOUNTER — Telehealth (HOSPITAL_COMMUNITY): Payer: Self-pay | Admitting: Physical Therapy

## 2014-07-24 ENCOUNTER — Emergency Department (HOSPITAL_COMMUNITY)
Admission: EM | Admit: 2014-07-24 | Discharge: 2014-07-25 | Disposition: A | Discharge status: Home / Self Care | Payer: Medicare Other | Attending: Emergency Medicine | Admitting: Emergency Medicine

## 2014-07-24 ENCOUNTER — Encounter (HOSPITAL_COMMUNITY): Payer: Self-pay | Admitting: *Deleted

## 2014-07-24 ENCOUNTER — Emergency Department (HOSPITAL_COMMUNITY): Payer: Medicare Other

## 2014-07-24 DIAGNOSIS — R531 Weakness: Secondary | ICD-10-CM | POA: Diagnosis present

## 2014-07-24 DIAGNOSIS — Z8719 Personal history of other diseases of the digestive system: Secondary | ICD-10-CM | POA: Diagnosis not present

## 2014-07-24 DIAGNOSIS — I120 Hypertensive chronic kidney disease with stage 5 chronic kidney disease or end stage renal disease: Secondary | ICD-10-CM | POA: Diagnosis not present

## 2014-07-24 DIAGNOSIS — R000 Tachycardia, unspecified: Secondary | ICD-10-CM | POA: Insufficient documentation

## 2014-07-24 DIAGNOSIS — Z86718 Personal history of other venous thrombosis and embolism: Secondary | ICD-10-CM | POA: Insufficient documentation

## 2014-07-24 DIAGNOSIS — N186 End stage renal disease: Secondary | ICD-10-CM | POA: Diagnosis not present

## 2014-07-24 DIAGNOSIS — Z872 Personal history of diseases of the skin and subcutaneous tissue: Secondary | ICD-10-CM | POA: Insufficient documentation

## 2014-07-24 DIAGNOSIS — G8929 Other chronic pain: Secondary | ICD-10-CM | POA: Diagnosis not present

## 2014-07-24 DIAGNOSIS — Z791 Long term (current) use of non-steroidal anti-inflammatories (NSAID): Secondary | ICD-10-CM | POA: Insufficient documentation

## 2014-07-24 DIAGNOSIS — E1140 Type 2 diabetes mellitus with diabetic neuropathy, unspecified: Secondary | ICD-10-CM | POA: Diagnosis not present

## 2014-07-24 DIAGNOSIS — Z792 Long term (current) use of antibiotics: Secondary | ICD-10-CM | POA: Diagnosis not present

## 2014-07-24 DIAGNOSIS — Z79899 Other long term (current) drug therapy: Secondary | ICD-10-CM | POA: Diagnosis not present

## 2014-07-24 DIAGNOSIS — M1990 Unspecified osteoarthritis, unspecified site: Secondary | ICD-10-CM | POA: Insufficient documentation

## 2014-07-24 DIAGNOSIS — N400 Enlarged prostate without lower urinary tract symptoms: Secondary | ICD-10-CM | POA: Insufficient documentation

## 2014-07-24 DIAGNOSIS — I951 Orthostatic hypotension: Secondary | ICD-10-CM

## 2014-07-24 DIAGNOSIS — R630 Anorexia: Secondary | ICD-10-CM | POA: Insufficient documentation

## 2014-07-24 DIAGNOSIS — Z862 Personal history of diseases of the blood and blood-forming organs and certain disorders involving the immune mechanism: Secondary | ICD-10-CM | POA: Insufficient documentation

## 2014-07-24 DIAGNOSIS — M109 Gout, unspecified: Secondary | ICD-10-CM | POA: Diagnosis not present

## 2014-07-24 DIAGNOSIS — Z992 Dependence on renal dialysis: Secondary | ICD-10-CM | POA: Insufficient documentation

## 2014-07-24 LAB — CBC WITH DIFFERENTIAL/PLATELET
BASOS PCT: 0 % (ref 0–1)
Basophils Absolute: 0 10*3/uL (ref 0.0–0.1)
EOS ABS: 0.1 10*3/uL (ref 0.0–0.7)
EOS PCT: 1 % (ref 0–5)
HCT: 44.2 % (ref 39.0–52.0)
HEMOGLOBIN: 14.4 g/dL (ref 13.0–17.0)
LYMPHS ABS: 1.1 10*3/uL (ref 0.7–4.0)
Lymphocytes Relative: 11 % — ABNORMAL LOW (ref 12–46)
MCH: 28.5 pg (ref 26.0–34.0)
MCHC: 32.6 g/dL (ref 30.0–36.0)
MCV: 87.5 fL (ref 78.0–100.0)
MONOS PCT: 7 % (ref 3–12)
Monocytes Absolute: 0.7 10*3/uL (ref 0.1–1.0)
NEUTROS ABS: 8.1 10*3/uL — AB (ref 1.7–7.7)
NEUTROS PCT: 81 % — AB (ref 43–77)
Platelets: 158 10*3/uL (ref 150–400)
RBC: 5.05 MIL/uL (ref 4.22–5.81)
RDW: 19.5 % — AB (ref 11.5–15.5)
WBC: 9.9 10*3/uL (ref 4.0–10.5)

## 2014-07-24 LAB — COMPREHENSIVE METABOLIC PANEL
ALK PHOS: 71 U/L (ref 38–126)
ALT: 17 U/L (ref 17–63)
AST: 28 U/L (ref 15–41)
Albumin: 3.9 g/dL (ref 3.5–5.0)
Anion gap: 13 (ref 5–15)
BILIRUBIN TOTAL: 0.9 mg/dL (ref 0.3–1.2)
BUN: 26 mg/dL — AB (ref 6–20)
CHLORIDE: 96 mmol/L — AB (ref 101–111)
CO2: 29 mmol/L (ref 22–32)
Calcium: 8.8 mg/dL — ABNORMAL LOW (ref 8.9–10.3)
Creatinine, Ser: 7.41 mg/dL — ABNORMAL HIGH (ref 0.61–1.24)
GFR calc Af Amer: 9 mL/min — ABNORMAL LOW (ref >60)
GFR calc non Af Amer: 8 mL/min — ABNORMAL LOW (ref >60)
Glucose, Bld: 84 mg/dL (ref 65–99)
POTASSIUM: 4.2 mmol/L (ref 3.5–5.1)
Sodium: 138 mmol/L (ref 135–145)
Total Protein: 8.4 g/dL — ABNORMAL HIGH (ref 6.5–8.1)

## 2014-07-24 LAB — CBG MONITORING, ED: Glucose-Capillary: 92 mg/dL (ref 65–99)

## 2014-07-24 LAB — TROPONIN I: Troponin I: <0.03 ng/mL (ref <0.031)

## 2014-07-24 MED ORDER — SODIUM CHLORIDE 0.9 % IV BOLUS (SEPSIS)
500.0000 mL | Freq: Once | INTRAVENOUS | Status: AC
  Administered 2014-07-24: 500 mL via INTRAVENOUS

## 2014-07-24 NOTE — Telephone Encounter (Signed)
Patient going to ER.

## 2014-07-24 NOTE — ED Notes (Signed)
Unable to obtain IV access after several attempts.  MD made aware.

## 2014-07-24 NOTE — Therapy (Signed)
[facility] [place], [place], [address] Phone: [phone]   Fax:  [phone]  Patient Details  Name: ITAI BARBIAN MRN: [id number] Date of Birth: [birth year] Referring Provider:  [name], MD  Encounter Date: 06/01/2014  PHYSICAL THERAPY DISCHARGE SUMMARY  Visits from Start of Care: 1   Education / Equipment:HEP Plan: Patient agrees to discharge.  Patient goals were not met. Patient is being discharged due to not returning since the last visit.  ?????     Rayetta Humphrey, PT CLT [phone] 07/24/2014, 4:30 PM  [facility] [address] [place], [place], [address] Phone: [phone]   Fax:  [phone]

## 2014-07-24 NOTE — ED Notes (Signed)
Pt states that he feels weak because his dialysis nurse told him his albumin was low and that he needed to eat more protein.  Pt states he even got a letter stating this from the nurse.  Pt states that he has not been able to eat his protein due to his food stamps being cut off a few weeks ago and that he needs a doctor to tell them that not having food stamps is being detrimental to his health.  Pt denies any complaints at this time.

## 2014-07-24 NOTE — ED Notes (Signed)
Onset weakness after dialysis  today

## 2014-07-24 NOTE — Addendum Note (Signed)
Addended by: Leeroy Cha on: 07/24/2014 04:32 PM   Modules accepted: Orders

## 2014-07-24 NOTE — ED Provider Notes (Signed)
CSN: [id number]     Arrival date & time 07/24/14  1524 History   First MD Initiated Contact with Patient 07/24/14 1715     Chief Complaint  Patient presents with  . Weakness     (Consider location/radiation/quality/duration/timing/severity/associated sxs/prior Treatment) HPI Comments: Patient presents with generalized weakness and fatigue after completing dialysis today. He states he felt well prior to dialysis 8 breakfast but did not eat lunch. He is a diet-controlled diabetic. He does not take insulin. He states he took off more fluid than usual at dialysis. He feels dizzy with standing and lightheaded. Denies any chest pain or shortness of breath. Denies any vertigo. Denies abdominal pain, fever, nausea or vomiting. He does not make any urine. Reports no recent missed dialysis sessions. He's been told by his dialysis center that he does not eat enough protein and he states his food stamps have been cut off and he is not getting enough protein. Patient with history of hypertension, diabetes, DVT no longer on anticoagulation, CK D and physical deconditioning.  The history is provided by the patient. The history is limited by the condition of the patient.    Past Medical History  Diagnosis Date  . Hypertension   . Diabetes mellitus   . Gout   . Chronic back pain   . BPH (benign prostatic hyperplasia)   . Anemia   . DVT (deep venous thrombosis)   . Neuropathy   . Decubitus ulcer     of 2nd toes of both feet.  . Edema leg   . Constipation   . Venous (peripheral) insufficiency   . Neuropathy, diabetic   . Physical deconditioning   . Poor balance   . Difficulty walking   . Lack of coordination   . Arthritis   . Renal insufficiency   . Chronic kidney disease   . Chronic kidney disease (CKD), stage IV (severe)   . Constipation    Past Surgical History  Procedure Laterality Date  . None    . Insertion of dialysis catheter    . Cyst excision Right     cyst removed on thumb 2001   . Insertion of dialysis catheter Left 03/09/2014    Procedure: INSERTION OF DIALYSIS CATHETER;  Surgeon: Angelia Mould, MD;  Location: [facility];  Service: Vascular;  Laterality: Left;  . Av fistula placement Left 03/09/2014    Procedure: INSERTION OF ARTERIOVENOUS (AV) GORE-TEX GRAFT ARM;  Surgeon: Angelia Mould, MD;  Location: [facility] OR;  Service: Vascular;  Laterality: Left;   Family History  Problem Relation Age of Onset  . Diabetes Mother   . Hypertension Mother   . Heart failure Mother   . Hyperlipidemia Mother   . Heart attack Mother   . Cancer Father   . Diabetes Father   . Hypertension Father   . Hyperlipidemia Father    History  Substance Use Topics  . Smoking status: Never Smoker   . Smokeless tobacco: Never Used  . Alcohol Use: No    Review of Systems  Constitutional: Positive for activity change, appetite change and fatigue. Negative for fever.  Eyes: Negative for visual disturbance.  Respiratory: Negative for cough, chest tightness and shortness of breath.   Cardiovascular: Negative for chest pain.  Gastrointestinal: Negative for nausea, vomiting and abdominal pain.  Genitourinary: Negative for dysuria, hematuria and testicular pain.  Musculoskeletal: Negative for myalgias, arthralgias, neck pain and neck stiffness.  Skin: Negative for wound.  Neurological: Positive for dizziness, weakness and light-headedness.  Negative for headaches.  A complete 10 system review of systems was obtained and all systems are negative except as noted in the HPI and PMH.      Allergies  Review of patient's allergies indicates no known allergies.  Home Medications   Prior to Admission medications   Medication Sig Start Date End Date Taking? Authorizing Provider  calcium acetate (PHOSLO) 667 MG capsule Take 1 capsule by mouth 3 (three) times daily. 04/11/14   Historical Provider, MD  colchicine 0.6 MG tablet Take 0.5 tablets (0.3 mg total) by mouth 2 (two) times a week. Take  0.6mg  (one tablet) by mouth every 1-2 hours until one of the following occurs: 1.  The pain is gone 2.  The maximum dose has been given ( no more than 3 tabs in 3 hours or 10 tabs in 24 hours) 3.  The side effects outweight the benefits 04/12/14   Noemi Chapel, MD  diclofenac sodium (VOLTAREN) 1 % GEL Apply to left wrist and hand three times daily 04/13/14   Lily Kocher, PA-C  febuxostat (ULORIC) 40 MG tablet Take 3 tablets (120 mg total) by mouth daily. 10/04/13   Rolland Porter, MD  gabapentin (NEURONTIN) 100 MG capsule  07/11/14   Historical Provider, MD  HYDROcodone-acetaminophen (NORCO/VICODIN) 5-325 MG per tablet Take 1-2 tablets by mouth every 4 (four) hours as needed. 04/13/14   Lily Kocher, PA-C  ondansetron (ZOFRAN ODT) 4 MG disintegrating tablet 4mg  ODT q4 hours prn nausea/vomit 02/16/14   Milton Ferguson, MD  oxyCODONE-acetaminophen (PERCOCET) 5-325 MG per tablet Take 1-2 tablets by mouth every 4 (four) hours as needed for moderate pain. 03/09/14   Samantha J Rhyne, PA-C  penicillin v potassium (VEETID) 500 MG tablet Take 1 tablet (500 mg total) by mouth 3 (three) times daily. 03/03/14   Fredia Sorrow, MD  RENVELA 800 MG tablet  07/18/14   Historical Provider, MD  sildenafil (VIAGRA) 50 MG tablet Take 50 mg by mouth daily as needed for erectile dysfunction.    Historical Provider, MD  traMADol (ULTRAM) 50 MG tablet Take 1 tablet (50 mg total) by mouth every 6 (six) hours as needed. 12/13/13   Glendell Docker, NP  ULORIC 80 MG TABS  06/30/14   Historical Provider, MD   BP 110/74 mmHg  Pulse 103  Temp(Src) 98.8 F (37.1 C) (Oral)  Resp 18  Ht 5\' 11"  (1.803 m)  Wt 262 lb (118.842 kg)  BMI 36.56 kg/m2  SpO2 99% Physical Exam  Constitutional: He is oriented to person, place, and time. He appears well-developed and well-nourished. No distress.  HENT:  Head: Normocephalic and atraumatic.  Mouth/Throat: Oropharynx is clear and moist. No oropharyngeal exudate.  Eyes: Conjunctivae and EOM are  normal. Pupils are equal, round, and reactive to light.  Neck: Normal range of motion. Neck supple.  No meningismus.  Cardiovascular: Normal rate, normal heart sounds and intact distal pulses.   No murmur heard. Tachycardia to 105  Pulmonary/Chest: Effort normal and breath sounds normal. No respiratory distress.  Abdominal: Soft. There is no tenderness. There is no rebound and no guarding.  Musculoskeletal: Normal range of motion. He exhibits no edema or tenderness.  Neurological: He is alert and oriented to person, place, and time. No cranial nerve deficit. He exhibits normal muscle tone. Coordination normal.  No ataxia on finger to nose bilaterally. No pronator drift. 5/5 strength throughout. CN 2-12 intact. Equal grip strength. Sensation intact.  Global weakness  Skin: Skin is warm.  Psychiatric: He has  a normal mood and affect. His behavior is normal.  Nursing note and vitals reviewed.   ED Course  Procedures (including critical care time) Labs Review Labs Reviewed  CBC WITH DIFFERENTIAL/PLATELET - Abnormal; Notable for the following:    RDW 19.5 (*)    Neutrophils Relative % 81 (*)    Neutro Abs 8.1 (*)    Lymphocytes Relative 11 (*)    All other components within normal limits  COMPREHENSIVE METABOLIC PANEL - Abnormal; Notable for the following:    Chloride 96 (*)    BUN 26 (*)    Creatinine, Ser 7.41 (*)    Calcium 8.8 (*)    Total Protein 8.4 (*)    GFR calc non Af Amer 8 (*)    GFR calc Af Amer 9 (*)    All other components within normal limits  TROPONIN I  CBG MONITORING, ED  CBG MONITORING, ED    Imaging Review Dg Chest 2 View  07/24/2014   CLINICAL DATA:  Weakness following dialysis  EXAM: CHEST - 2 VIEW  COMPARISON:  04/05/2014  FINDINGS: Cardiac shadow is enlarged but stable. The overall inspiratory effort is poor although no focal infiltrate is seen. No sizable effusion is noted.  IMPRESSION: Poor inspiratory effort without acute abnormality.   Electronically  Signed   By: Inez Catalina M.D.   On: 07/24/2014 18:15     EKG Interpretation   Date/Time:  Monday July 24 2014 17:35:43 EDT Ventricular Rate:  97 PR Interval:  127 QRS Duration: 125 QT Interval:  376 QTC Calculation: 478 R Axis:   95 Text Interpretation:  Sinus rhythm Right bundle branch block ST elev,  probable normal early repol pattern No significant change was found  Confirmed by Wyvonnia Dusky  MD, Aldridge Krzyzanowski [phone]) on 07/24/2014 5:56:09 PM      MDM   Final diagnoses:  Orthostasis   6 generalized weakness with dizziness and lightheadedness after dialysis. No chest pain or shortness of breath. States completed session normally. Admits to poor by mouth intake and poor appetite.  EKG is unchanged. Orthostatics positive heart rate elevates to 120 with standing. 99 at rest. Patient given IV and by mouth fluids.  Labs appear to be at baseline. Potassium is normal. Patient given 500 mL NS bolus.  He is tolerating PO well.  Denies CP, SOB, nausea, vomiting, dizziness.  He is ambulatory. HR improved to 90s.   Suspect orthostasis after dialysis. He has been resting comfortably after prolonged ED stay. Patient feels improved and wishing to go home.  Return precautions discussed.  Ezequiel Essex, MD 07/25/14 1544

## 2014-07-25 ENCOUNTER — Emergency Department (HOSPITAL_COMMUNITY)
Admission: EM | Admit: 2014-07-25 | Discharge: 2014-07-25 | Disposition: A | Discharge status: Home / Self Care | Payer: Medicare Other | Attending: Emergency Medicine | Admitting: Emergency Medicine

## 2014-07-25 ENCOUNTER — Encounter (HOSPITAL_COMMUNITY): Payer: Self-pay

## 2014-07-25 DIAGNOSIS — Z862 Personal history of diseases of the blood and blood-forming organs and certain disorders involving the immune mechanism: Secondary | ICD-10-CM | POA: Insufficient documentation

## 2014-07-25 DIAGNOSIS — M1990 Unspecified osteoarthritis, unspecified site: Secondary | ICD-10-CM | POA: Diagnosis not present

## 2014-07-25 DIAGNOSIS — Z79899 Other long term (current) drug therapy: Secondary | ICD-10-CM | POA: Diagnosis not present

## 2014-07-25 DIAGNOSIS — I120 Hypertensive chronic kidney disease with stage 5 chronic kidney disease or end stage renal disease: Secondary | ICD-10-CM | POA: Insufficient documentation

## 2014-07-25 DIAGNOSIS — G629 Polyneuropathy, unspecified: Secondary | ICD-10-CM | POA: Insufficient documentation

## 2014-07-25 DIAGNOSIS — M109 Gout, unspecified: Secondary | ICD-10-CM | POA: Insufficient documentation

## 2014-07-25 DIAGNOSIS — E119 Type 2 diabetes mellitus without complications: Secondary | ICD-10-CM | POA: Insufficient documentation

## 2014-07-25 DIAGNOSIS — Z791 Long term (current) use of non-steroidal anti-inflammatories (NSAID): Secondary | ICD-10-CM | POA: Insufficient documentation

## 2014-07-25 DIAGNOSIS — Z8719 Personal history of other diseases of the digestive system: Secondary | ICD-10-CM | POA: Insufficient documentation

## 2014-07-25 DIAGNOSIS — Z872 Personal history of diseases of the skin and subcutaneous tissue: Secondary | ICD-10-CM | POA: Insufficient documentation

## 2014-07-25 DIAGNOSIS — N186 End stage renal disease: Secondary | ICD-10-CM | POA: Diagnosis not present

## 2014-07-25 DIAGNOSIS — Z86718 Personal history of other venous thrombosis and embolism: Secondary | ICD-10-CM | POA: Diagnosis not present

## 2014-07-25 DIAGNOSIS — R531 Weakness: Secondary | ICD-10-CM | POA: Diagnosis present

## 2014-07-25 DIAGNOSIS — Z992 Dependence on renal dialysis: Secondary | ICD-10-CM | POA: Insufficient documentation

## 2014-07-25 DIAGNOSIS — G8929 Other chronic pain: Secondary | ICD-10-CM | POA: Diagnosis not present

## 2014-07-25 LAB — I-STAT CHEM 8, ED
BUN: 42 mg/dL — ABNORMAL HIGH (ref 6–20)
CHLORIDE: 99 mmol/L — AB (ref 101–111)
Calcium, Ion: 1.11 mmol/L — ABNORMAL LOW (ref 1.12–1.23)
Creatinine, Ser: 10.6 mg/dL — ABNORMAL HIGH (ref 0.61–1.24)
Glucose, Bld: 71 mg/dL (ref 65–99)
HCT: 41 % (ref 39.0–52.0)
Hemoglobin: 13.9 g/dL (ref 13.0–17.0)
Potassium: 4.3 mmol/L (ref 3.5–5.1)
Sodium: 139 mmol/L (ref 135–145)
TCO2: 26 mmol/L (ref 0–100)

## 2014-07-25 MED ORDER — ONDANSETRON 4 MG PO TBDP: ORAL_TABLET | ORAL | Status: DC

## 2014-07-25 MED ORDER — ACETAMINOPHEN 325 MG PO TABS
650.0000 mg | ORAL_TABLET | Freq: Once | ORAL | Status: AC
  Administered 2014-07-25: 650 mg via ORAL
  Filled 2014-07-25: qty 2

## 2014-07-25 MED ORDER — ONDANSETRON 4 MG PO TBDP
4.0000 mg | ORAL_TABLET | Freq: Once | ORAL | Status: AC
  Administered 2014-07-25: 4 mg via ORAL
  Filled 2014-07-25: qty 1

## 2014-07-25 NOTE — ED Notes (Signed)
MD at bedside. 

## 2014-07-25 NOTE — ED Notes (Signed)
Pt reports woke up this morning c/o chest pain, dizziness, nausea, and generalized weakness.  Reports had dialysis yesterday.

## 2014-07-25 NOTE — ED Notes (Signed)
Pt states he feels about the same.  Pt no distress.

## 2014-07-25 NOTE — Discharge Instructions (Signed)

## 2014-07-25 NOTE — ED Provider Notes (Signed)
CSN: [id number]     Arrival date & time 07/25/14  1458 History   First MD Initiated Contact with Patient 07/25/14 1738     Chief Complaint  Patient presents with  . Weakness  . Nausea     (Consider location/radiation/quality/duration/timing/severity/associated sxs/prior Treatment) Patient is a 49 y.o. male presenting with weakness. The history is provided by the patient (pt complains of weakness and being [place]).  Weakness This is a recurrent problem. The current episode started 3 to 5 hours ago. The problem occurs constantly. The problem has not changed since onset.Pertinent negatives include no chest pain, no abdominal pain and no headaches. Nothing aggravates the symptoms. Nothing relieves the symptoms.    Past Medical History  Diagnosis Date  . Hypertension   . Diabetes mellitus   . Gout   . Chronic back pain   . BPH (benign prostatic hyperplasia)   . Anemia   . DVT (deep venous thrombosis)   . Neuropathy   . Decubitus ulcer     of 2nd toes of both feet.  . Edema leg   . Constipation   . Venous (peripheral) insufficiency   . Neuropathy, diabetic   . Physical deconditioning   . Poor balance   . Difficulty walking   . Lack of coordination   . Arthritis   . Renal insufficiency   . Chronic kidney disease   . Chronic kidney disease (CKD), stage IV (severe)   . Constipation    Past Surgical History  Procedure Laterality Date  . None    . Insertion of dialysis catheter    . Cyst excision Right     cyst removed on thumb 2001  . Insertion of dialysis catheter Left 03/09/2014    Procedure: INSERTION OF DIALYSIS CATHETER;  Surgeon: Angelia Mould, MD;  Location: [place];  Service: Vascular;  Laterality: Left;  . Av fistula placement Left 03/09/2014    Procedure: INSERTION OF ARTERIOVENOUS (AV) GORE-TEX GRAFT ARM;  Surgeon: Angelia Mould, MD;  Location: [facility] OR;  Service: Vascular;  Laterality: Left;   Family History  Problem Relation Age of Onset  . Diabetes  Mother   . Hypertension Mother   . Heart failure Mother   . Hyperlipidemia Mother   . Heart attack Mother   . Cancer Father   . Diabetes Father   . Hypertension Father   . Hyperlipidemia Father    History  Substance Use Topics  . Smoking status: Never Smoker   . Smokeless tobacco: Never Used  . Alcohol Use: No    Review of Systems  Constitutional: Negative for appetite change and fatigue.  HENT: Negative for congestion, ear discharge and sinus pressure.   Eyes: Negative for discharge.  Respiratory: Negative for cough.   Cardiovascular: Negative for chest pain.  Gastrointestinal: Negative for abdominal pain and diarrhea.  Genitourinary: Negative for frequency and hematuria.  Musculoskeletal: Negative for back pain.  Skin: Negative for rash.  Neurological: Positive for weakness. Negative for seizures and headaches.  Psychiatric/Behavioral: Negative for hallucinations.      Allergies  Review of patient's allergies indicates no known allergies.  Home Medications   Prior to Admission medications   Medication Sig Start Date End Date Taking? Authorizing Provider  calcium acetate (PHOSLO) 667 MG capsule Take 1 capsule by mouth 3 (three) times daily. 04/11/14   Historical Provider, MD  colchicine 0.6 MG tablet Take 0.5 tablets (0.3 mg total) by mouth 2 (two) times a week. Take 0.6mg  (one tablet) by mouth  every 1-2 hours until one of the following occurs: 1.  The pain is gone 2.  The maximum dose has been given ( no more than 3 tabs in 3 hours or 10 tabs in 24 hours) 3.  The side effects outweight the benefits 04/12/14   Noemi Chapel, MD  diclofenac sodium (VOLTAREN) 1 % GEL Apply to left wrist and hand three times daily 04/13/14   Lily Kocher, PA-C  febuxostat (ULORIC) 40 MG tablet Take 3 tablets (120 mg total) by mouth daily. 10/04/13   Rolland Porter, MD  gabapentin (NEURONTIN) 100 MG capsule  07/11/14   Historical Provider, MD  RENVELA 800 MG tablet  07/18/14   Historical Provider, MD   sildenafil (VIAGRA) 50 MG tablet Take 50 mg by mouth daily as needed for erectile dysfunction.    Historical Provider, MD   BP 122/79 mmHg  Pulse 89  Temp(Src) 98.1 F (36.7 C) (Oral)  Resp 20  Ht 5\' 11"  (1.803 m)  Wt 262 lb (118.842 kg)  BMI 36.56 kg/m2  SpO2 100% Physical Exam  Constitutional: He is oriented to person, place, and time. He appears well-developed.  HENT:  Head: Normocephalic.  Eyes: Conjunctivae and EOM are normal. No scleral icterus.  Neck: Neck supple. No thyromegaly present.  Cardiovascular: Normal rate and regular rhythm.  Exam reveals no gallop and no friction rub.   No murmur heard. Pulmonary/Chest: No stridor. He has no wheezes. He has no rales. He exhibits no tenderness.  Abdominal: He exhibits no distension. There is no tenderness. There is no rebound.  Musculoskeletal: Normal range of motion. He exhibits no edema.  Lymphadenopathy:    He has no cervical adenopathy.  Neurological: He is oriented to person, place, and time. He exhibits normal muscle tone. Coordination normal.  Skin: No rash noted. No erythema.  Psychiatric: He has a normal mood and affect. His behavior is normal.    ED Course  Procedures (including critical care time) Labs Review Labs Reviewed  I-STAT CHEM 8, ED - Abnormal; Notable for the following:    Chloride 99 (*)    BUN 42 (*)    Creatinine, Ser 10.60 (*)    Calcium, Ion 1.11 (*)    All other components within normal limits    Imaging Review Dg Chest 2 View  07/24/2014   CLINICAL DATA:  Weakness following dialysis  EXAM: CHEST - 2 VIEW  COMPARISON:  04/05/2014  FINDINGS: Cardiac shadow is enlarged but stable. The overall inspiratory effort is poor although no focal infiltrate is seen. No sizable effusion is noted.  IMPRESSION: Poor inspiratory effort without acute abnormality.   Electronically Signed   By: Inez Catalina M.D.   On: 07/24/2014 18:15     EKG Interpretation None      MDM   Final diagnoses:  Weakness     Weakness,  Dialysis,  Pt to go to dialysis tomorrow    Milton Ferguson, MD 07/25/14 2020

## 2014-07-25 NOTE — Discharge Instructions (Signed)
Follow up with your family md in one week

## 2014-07-28 NOTE — Patient Outreach (Signed)
[facility]) Care Management  07/28/2014  Jack Irwin 1965/04/28 [id number]   Referral from [place] [facility], assigned Sherrin Daisy, RN to outreach.  [name]. [name], [name] Management [place] Assistant Phone: [phone] Fax: [phone]

## 2014-08-07 IMAGING — CR DG CHEST 2V
2 series · 2 of 2 positions shown · non-contrast
Comparison: 10/28/2012

CLINICAL DATA: Chest pain.  Cough.  Hypertension.

EXAM:
CHEST  2 VIEW

[view not recorded (1 of 2)]
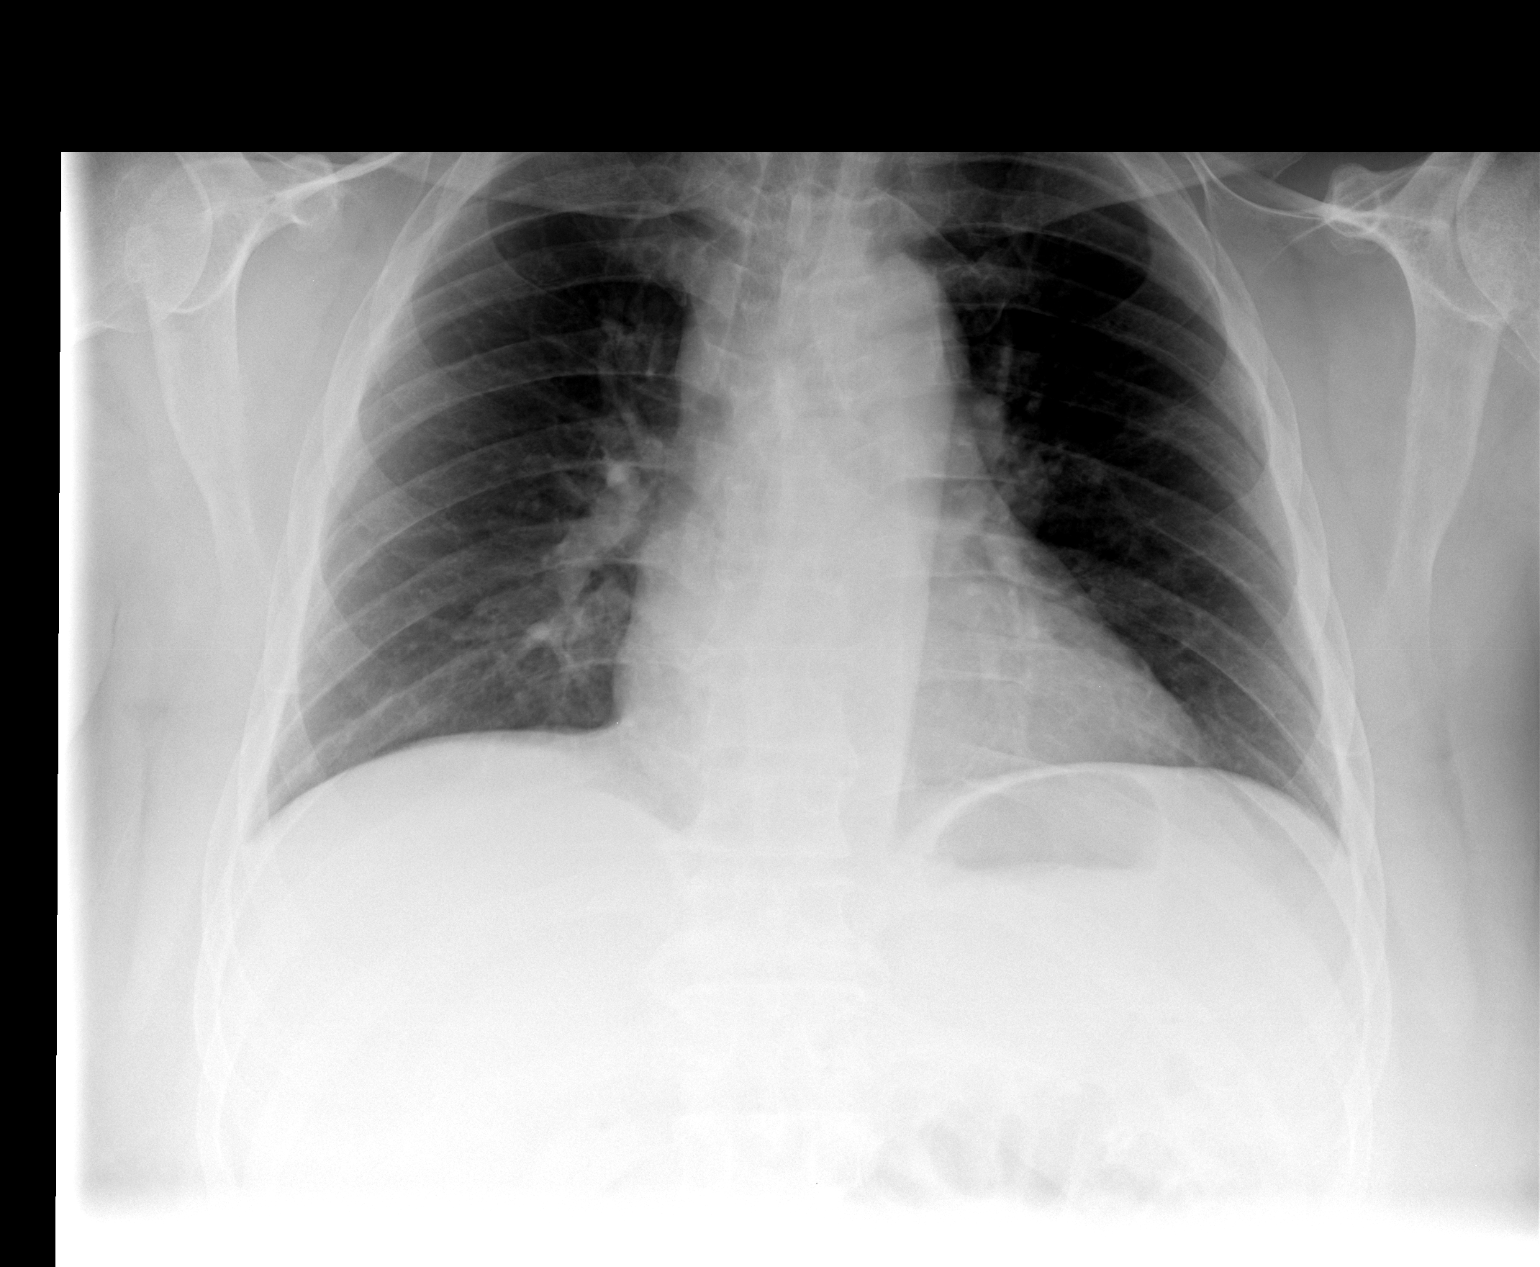

[view not recorded (2 of 2)]
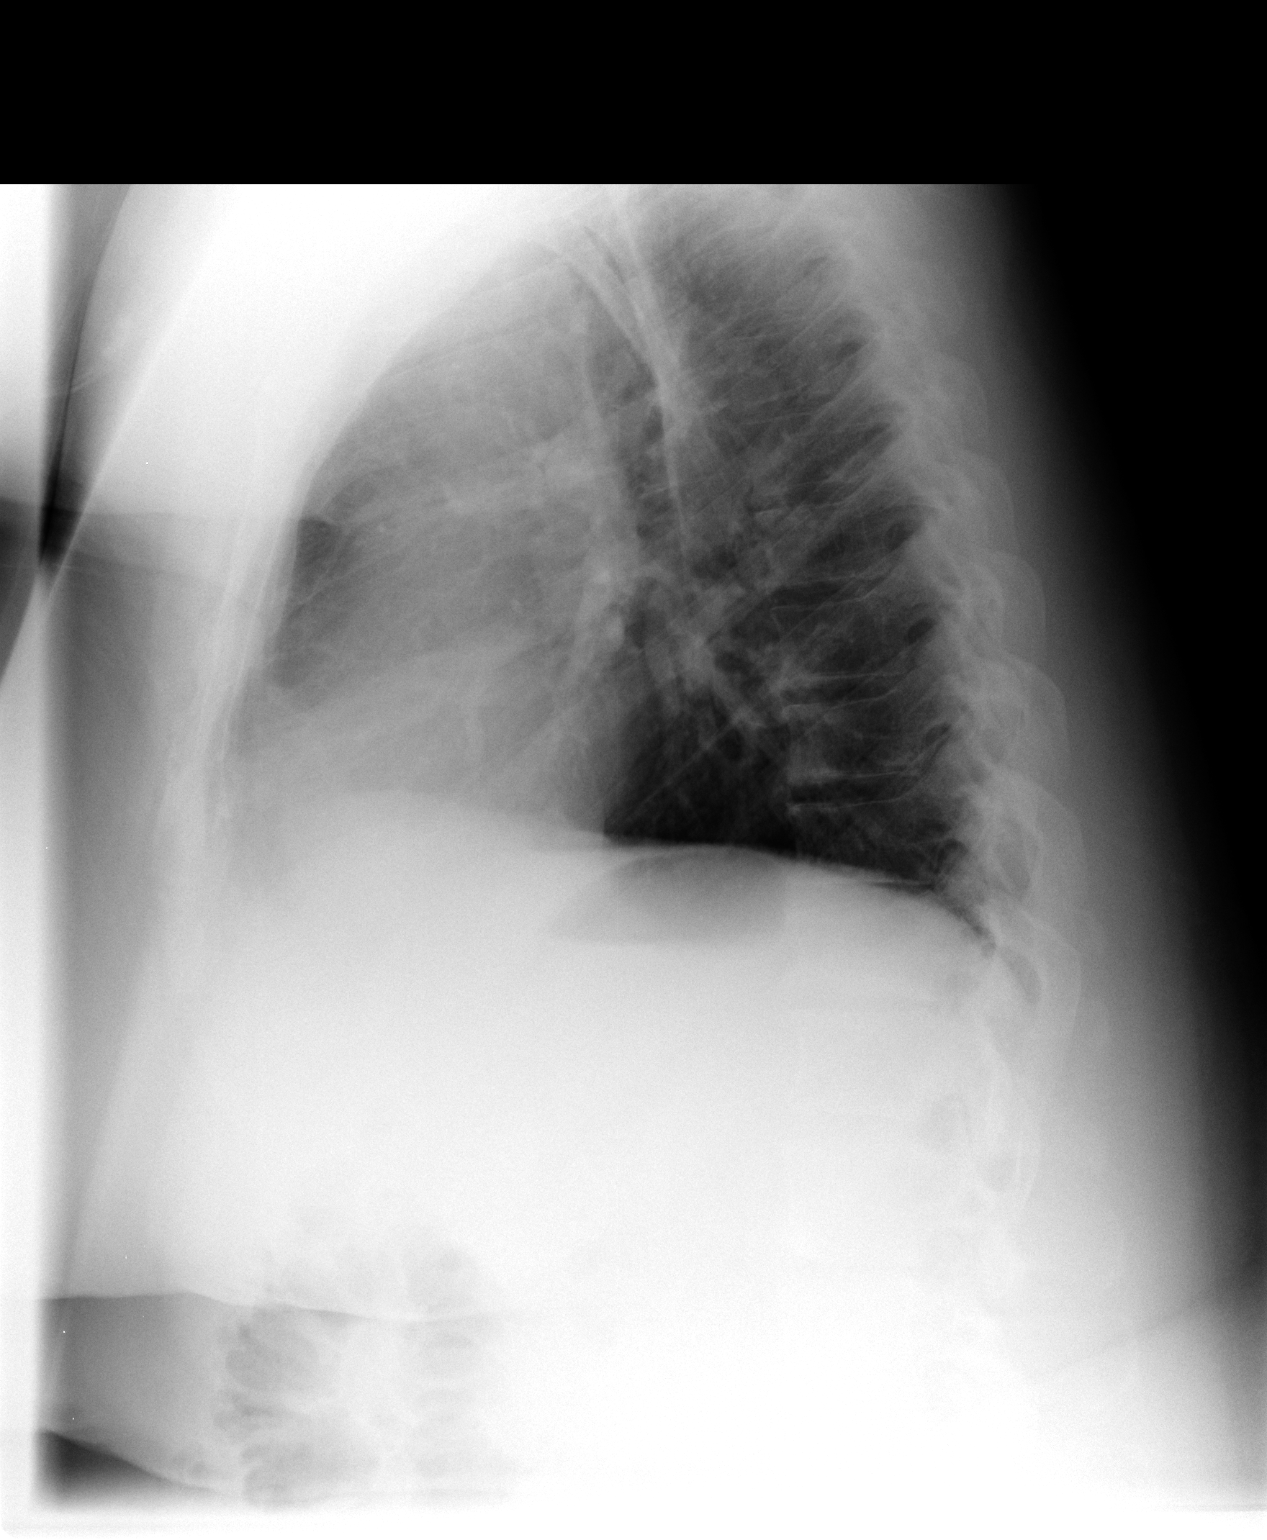

[2 of 2 positions shown; findings below may reference images not displayed]

FINDINGS: The heart size and mediastinal contours are within normal limits.
Both lungs are clear. The visualized skeletal structures are
unremarkable.
IMPRESSION: No active cardiopulmonary disease.

## 2014-08-08 ENCOUNTER — Other Ambulatory Visit: Payer: Self-pay | Admitting: *Deleted

## 2014-08-08 NOTE — Patient Outreach (Signed)
[facility]) Care Management  08/08/2014  Jack Irwin August 02, 1965 [id number]  Referral from High risk list:  Telephone call to patient; no answer, unable to leave message.  Plan: will follow up.   Plan: will follow up with repeat call. Sherrin Daisy, RN BSN [place] Management Coordinator [facility] Care Management  [phone]

## 2014-08-10 ENCOUNTER — Other Ambulatory Visit: Payer: Self-pay | Admitting: *Deleted

## 2014-08-10 NOTE — Patient Outreach (Signed)
[place] [facility]) Care Management  08/10/2014  F…LIX RIVERS 03/14/1965 [id number]   High Risk referral: Telephone call to patient; states unable to talk now; request call back at another time.Patient agrees with set appointment for recall.   Plan: will follow up with call. Sherrin Daisy, RN BSN [place] Management Coordinator [facility] Care Management  [phone]

## 2014-08-11 ENCOUNTER — Encounter: Payer: Self-pay | Admitting: *Deleted

## 2014-08-11 ENCOUNTER — Other Ambulatory Visit: Payer: Self-pay | Admitting: *Deleted

## 2014-08-11 NOTE — Patient Outreach (Signed)
[place] [facility]) Care Management  08/11/2014  Jack Irwin [birth year] [id number]  Telephone to patient; left message with person who answered call requesting call back.  Plan: will send outreach letter and follow up in 10 business days. Sherrin Daisy, RN BSN [place] Management Coordinator [facility] Care Management  [phone]

## 2014-08-28 ENCOUNTER — Emergency Department (HOSPITAL_COMMUNITY)
Admission: EM | Admit: 2014-08-28 | Discharge: 2014-08-28 | Disposition: A | Discharge status: Home / Self Care | Payer: Medicare Other | Attending: Emergency Medicine | Admitting: Emergency Medicine

## 2014-08-28 ENCOUNTER — Encounter (HOSPITAL_COMMUNITY): Payer: Self-pay | Admitting: *Deleted

## 2014-08-28 DIAGNOSIS — N186 End stage renal disease: Secondary | ICD-10-CM | POA: Insufficient documentation

## 2014-08-28 DIAGNOSIS — I120 Hypertensive chronic kidney disease with stage 5 chronic kidney disease or end stage renal disease: Secondary | ICD-10-CM | POA: Diagnosis not present

## 2014-08-28 DIAGNOSIS — Z872 Personal history of diseases of the skin and subcutaneous tissue: Secondary | ICD-10-CM | POA: Insufficient documentation

## 2014-08-28 DIAGNOSIS — Z8719 Personal history of other diseases of the digestive system: Secondary | ICD-10-CM | POA: Diagnosis not present

## 2014-08-28 DIAGNOSIS — E1140 Type 2 diabetes mellitus with diabetic neuropathy, unspecified: Secondary | ICD-10-CM | POA: Diagnosis not present

## 2014-08-28 DIAGNOSIS — Z862 Personal history of diseases of the blood and blood-forming organs and certain disorders involving the immune mechanism: Secondary | ICD-10-CM | POA: Insufficient documentation

## 2014-08-28 DIAGNOSIS — Z86718 Personal history of other venous thrombosis and embolism: Secondary | ICD-10-CM | POA: Diagnosis not present

## 2014-08-28 DIAGNOSIS — E119 Type 2 diabetes mellitus without complications: Secondary | ICD-10-CM | POA: Diagnosis not present

## 2014-08-28 DIAGNOSIS — M109 Gout, unspecified: Secondary | ICD-10-CM | POA: Diagnosis not present

## 2014-08-28 DIAGNOSIS — R531 Weakness: Secondary | ICD-10-CM | POA: Diagnosis present

## 2014-08-28 DIAGNOSIS — M1990 Unspecified osteoarthritis, unspecified site: Secondary | ICD-10-CM | POA: Insufficient documentation

## 2014-08-28 DIAGNOSIS — G8929 Other chronic pain: Secondary | ICD-10-CM | POA: Diagnosis not present

## 2014-08-28 DIAGNOSIS — Z791 Long term (current) use of non-steroidal anti-inflammatories (NSAID): Secondary | ICD-10-CM | POA: Diagnosis not present

## 2014-08-28 DIAGNOSIS — Z79899 Other long term (current) drug therapy: Secondary | ICD-10-CM | POA: Insufficient documentation

## 2014-08-28 DIAGNOSIS — Z87438 Personal history of other diseases of male genital organs: Secondary | ICD-10-CM | POA: Diagnosis not present

## 2014-08-28 DIAGNOSIS — Z992 Dependence on renal dialysis: Secondary | ICD-10-CM | POA: Insufficient documentation

## 2014-08-28 LAB — URINALYSIS, ROUTINE W REFLEX MICROSCOPIC
Bilirubin Urine: NEGATIVE
GLUCOSE, UA: NEGATIVE mg/dL
KETONES UR: NEGATIVE mg/dL
NITRITE: NEGATIVE
PH: 5.5 (ref 5.0–8.0)
Specific Gravity, Urine: 1.02 (ref 1.005–1.030)
Urobilinogen, UA: 0.2 mg/dL (ref 0.0–1.0)

## 2014-08-28 LAB — URINE MICROSCOPIC-ADD ON

## 2014-08-28 LAB — CBG MONITORING, ED: GLUCOSE-CAPILLARY: 94 mg/dL (ref 65–99)

## 2014-08-28 NOTE — ED Notes (Signed)
Pt just came from hemodialysis, stated that over 4 liters pulled off.  Pt c/o HA, generalized weakness and dizziness

## 2014-08-28 NOTE — ED Notes (Signed)
Pt alert & oriented x4, stable gait. Patient given discharge instructions, paperwork & prescription(s). Patient  instructed to stop at the registration desk to finish any additional paperwork. Patient  verbalized understanding. Pt left department in wheelchair w/ no further questions. 

## 2014-08-28 NOTE — ED Notes (Signed)
MD at bedside. 

## 2014-08-28 NOTE — ED Notes (Signed)
Pt given drink & nabs ok by EDP.

## 2014-08-28 NOTE — ED Notes (Signed)
Pt states he is ready to go. Advised waiting on lab results. Pt asking for a sandwich.

## 2014-08-28 NOTE — ED Notes (Signed)
Instructed patient to give urine sample when able. Verbalizes understanding. Urinal provided at bedside.

## 2014-08-28 NOTE — ED Provider Notes (Signed)
CSN: [id number]     Arrival date & time 08/28/14  1411 History   First MD Initiated Contact with Patient 08/28/14 1713     Chief Complaint  Patient presents with  . Weakness     (Consider location/radiation/quality/duration/timing/severity/associated sxs/prior Treatment) HPI Comments: The patient is a 49 year old male, he is a dialysis patient who also has a history of hypertension. He presents from dialysis after having 4 L of fluid pulled off. He states that he was at the beach over the weekend and missed his last dialysis appointment. He also reports that he is not getting very much to eat at home because "they cut my food stamps". He reports that he developed generalized dizziness described as a lightheadedness which occurred immediately after dialysis, it was gradually getting better and has now resolved. He had no associated fevers chills nausea vomiting diarrhea abdominal pain coughing shortness of breath or chest pain. He denies headache, changes in vision, or focal weakness. At this time the patient states "can I have a sandwich before I go". He states that he is back to normal.  Patient is a 49 y.o. male presenting with weakness. The history is provided by the patient.  Weakness    Past Medical History  Diagnosis Date  . Hypertension   . Diabetes mellitus   . Gout   . Chronic back pain   . BPH (benign prostatic hyperplasia)   . Anemia   . DVT (deep venous thrombosis)   . Neuropathy   . Decubitus ulcer     of 2nd toes of both feet.  . Edema leg   . Constipation   . Venous (peripheral) insufficiency   . Neuropathy, diabetic   . Physical deconditioning   . Poor balance   . Difficulty walking   . Lack of coordination   . Arthritis   . Renal insufficiency   . Chronic kidney disease   . Chronic kidney disease (CKD), stage IV (severe)   . Constipation    Past Surgical History  Procedure Laterality Date  . None    . Insertion of dialysis catheter    . Cyst excision  Right     cyst removed on thumb 2001  . Insertion of dialysis catheter Left 03/09/2014    Procedure: INSERTION OF DIALYSIS CATHETER;  Surgeon: Angelia Mould, MD;  Location: [place];  Service: Vascular;  Laterality: Left;  . Av fistula placement Left 03/09/2014    Procedure: INSERTION OF ARTERIOVENOUS (AV) GORE-TEX GRAFT ARM;  Surgeon: Angelia Mould, MD;  Location: [facility] OR;  Service: Vascular;  Laterality: Left;   Family History  Problem Relation Age of Onset  . Diabetes Mother   . Hypertension Mother   . Heart failure Mother   . Hyperlipidemia Mother   . Heart attack Mother   . Cancer Father   . Diabetes Father   . Hypertension Father   . Hyperlipidemia Father    History  Substance Use Topics  . Smoking status: Never Smoker   . Smokeless tobacco: Never Used  . Alcohol Use: No    Review of Systems  Neurological: Positive for weakness.  All other systems reviewed and are negative.     Allergies  Review of patient's allergies indicates no known allergies.  Home Medications   Prior to Admission medications   Medication Sig Start Date End Date Taking? Authorizing Provider  calcium acetate (PHOSLO) 667 MG capsule Take 1 capsule by mouth 3 (three) times daily. 04/11/14   Historical Provider,  MD  colchicine 0.6 MG tablet Take 0.5 tablets (0.3 mg total) by mouth 2 (two) times a week. Take 0.6mg  (one tablet) by mouth every 1-2 hours until one of the following occurs: 1.  The pain is gone 2.  The maximum dose has been given ( no more than 3 tabs in 3 hours or 10 tabs in 24 hours) 3.  The side effects outweight the benefits 04/12/14   Noemi Chapel, MD  diclofenac sodium (VOLTAREN) 1 % GEL Apply to left wrist and hand three times daily 04/13/14   Lily Kocher, PA-C  febuxostat (ULORIC) 40 MG tablet Take 3 tablets (120 mg total) by mouth daily. 10/04/13   Rolland Porter, MD  gabapentin (NEURONTIN) 100 MG capsule  07/11/14   Historical Provider, MD  ondansetron (ZOFRAN ODT) 4 MG  disintegrating tablet 4mg  ODT q4 hours prn nausea/vomit 07/25/14   Milton Ferguson, MD  RENVELA 800 MG tablet  07/18/14   Historical Provider, MD  sildenafil (VIAGRA) 50 MG tablet Take 50 mg by mouth daily as needed for erectile dysfunction.    Historical Provider, MD   BP 106/73 mmHg  Pulse 75  Temp(Src) 98.2 F (36.8 C) (Oral)  Resp 21  Ht 5\' 11"  (1.803 m)  Wt 260 lb (117.935 kg)  BMI 36.28 kg/m2  SpO2 96% Physical Exam  Constitutional: He appears well-developed and well-nourished. No distress.  HENT:  Head: Normocephalic and atraumatic.  Mouth/Throat: Oropharynx is clear and moist. No oropharyngeal exudate.  Eyes: Conjunctivae and EOM are normal. Pupils are equal, round, and reactive to light. Right eye exhibits no discharge. Left eye exhibits no discharge. No scleral icterus.  Neck: Normal range of motion. Neck supple. No JVD present. No thyromegaly present.  Cardiovascular: Normal rate, regular rhythm, normal heart sounds and intact distal pulses.  Exam reveals no gallop and no friction rub.   No murmur heard. Dialysis access to the left upper arm with good thrill, no redness or tenderness, no bleeding  Pulmonary/Chest: Effort normal and breath sounds normal. No respiratory distress. He has no wheezes. He has no rales.  Clear lung sounds, no rales, speaks in full sentences  Abdominal: Soft. Bowel sounds are normal. He exhibits no distension and no mass. There is no tenderness.  Musculoskeletal: Normal range of motion. He exhibits no edema or tenderness.  Scant peripheral edema of the lower extremities, no asymmetry  Lymphadenopathy:    He has no cervical adenopathy.  Neurological: He is alert. Coordination normal.  Neurologic exam:  Speech clear, pupils equal round reactive to light, extraocular movements intact  Normal peripheral visual fields Cranial nerves III through XII normal including no facial droop Follows commands, moves all extremities x4, normal strength to bilateral  upper and lower extremities at all major muscle groups including grip Sensation normal to light touch and pinprick Coordination intact, no limb ataxia, finger-nose-finger normal Rapid alternating movements normal No pronator drift  Skin: Skin is warm and dry. No rash noted. No erythema.  Psychiatric: He has a normal mood and affect. His behavior is normal.  Nursing note and vitals reviewed.   ED Course  Procedures (including critical care time) Labs Review Labs Reviewed  URINALYSIS, ROUTINE W REFLEX MICROSCOPIC (NOT AT [facility]) - Abnormal; Notable for the following:    Hgb urine dipstick SMALL (*)    Protein, ur TRACE (*)    Leukocytes, UA TRACE (*)    All other components within normal limits  URINE MICROSCOPIC-ADD ON  CBG MONITORING, ED  Imaging Review No results found.   EKG Interpretation   Date/Time:  Monday August 28 2014 15:41:44 EDT Ventricular Rate:  83 PR Interval:  136 QRS Duration: 130 QT Interval:  414 QTC Calculation: 486 R Axis:   10 Text Interpretation:  Sinus rhythm with Premature supraventricular  complexes Right bundle branch block Abnormal ECG since last tracing no  significant change Confirmed by Tariyah Pendry  MD, Verbena Boeding ([id number]) on 08/28/2014  4:57:29 PM      MDM   Final diagnoses:  Weakness    At this time the patient has normal exam, he is able to move both arms without difficulty, normal coordination of the arms and the legs, normal strength in all 4 extremities, cranial nerves III through XII appear intact, vital signs are significant only for mild hypotension with a blood pressure of 98/78. He has no symptoms, I will check a urinalysis, his EKG is unremarkable, anticipate discharge. The patient will be given some food prior to discharge  Normal BP - has tolerated PO, no sx at this time, no fevers, BP improved.  Filed Vitals:   08/28/14 1800 08/28/14 1830 08/28/14 1900 08/28/14 1930  BP: 105/78 104/78 116/83 106/73  Pulse: 72 74 74 75  Temp:       TempSrc:      Resp: 21 17 14 21   Height:      Weight:      SpO2: 100% 96% 99% 96%     Noemi Chapel, MD 08/28/14 1949

## 2014-08-30 ENCOUNTER — Encounter: Payer: Self-pay | Admitting: *Deleted

## 2014-08-30 NOTE — Patient Outreach (Signed)
[place] [facility]) Care Management  08/30/2014  KHAEL LINO [birth year] [id number]   Notification from Sherrin Daisy, RN to close case due to unable to contact patient for [place] Management services.  [name]. [place], [place] Management [place] Assistant Phone: [phone] Fax: [phone]

## 2014-08-30 NOTE — Patient Outreach (Signed)
[place] [facility]) Care Management  08/30/2014  KRISTION DEFORGE 1965-04-12 [id number]   No response to outreach attempts from patient.  Plan: will close case and send MD closure letter.   Sherrin Daisy, RN BSN [place] Management Coordinator [facility] Care Management  [phone]

## 2014-08-31 ENCOUNTER — Ambulatory Visit: Payer: Medicare Other | Admitting: Podiatry

## 2014-09-29 IMAGING — CR DG ABDOMEN ACUTE W/ 1V CHEST
4 series · 4 of 4 positions shown · non-contrast
Comparison: 02/03/2013 chest radiograph and 10/28/2012 and
abdominal radiographs

CLINICAL DATA: Abdominal pain.

EXAM:
ACUTE ABDOMEN SERIES (ABDOMEN 2 VIEW & CHEST 1 VIEW)

[view not recorded (1 of 4)]
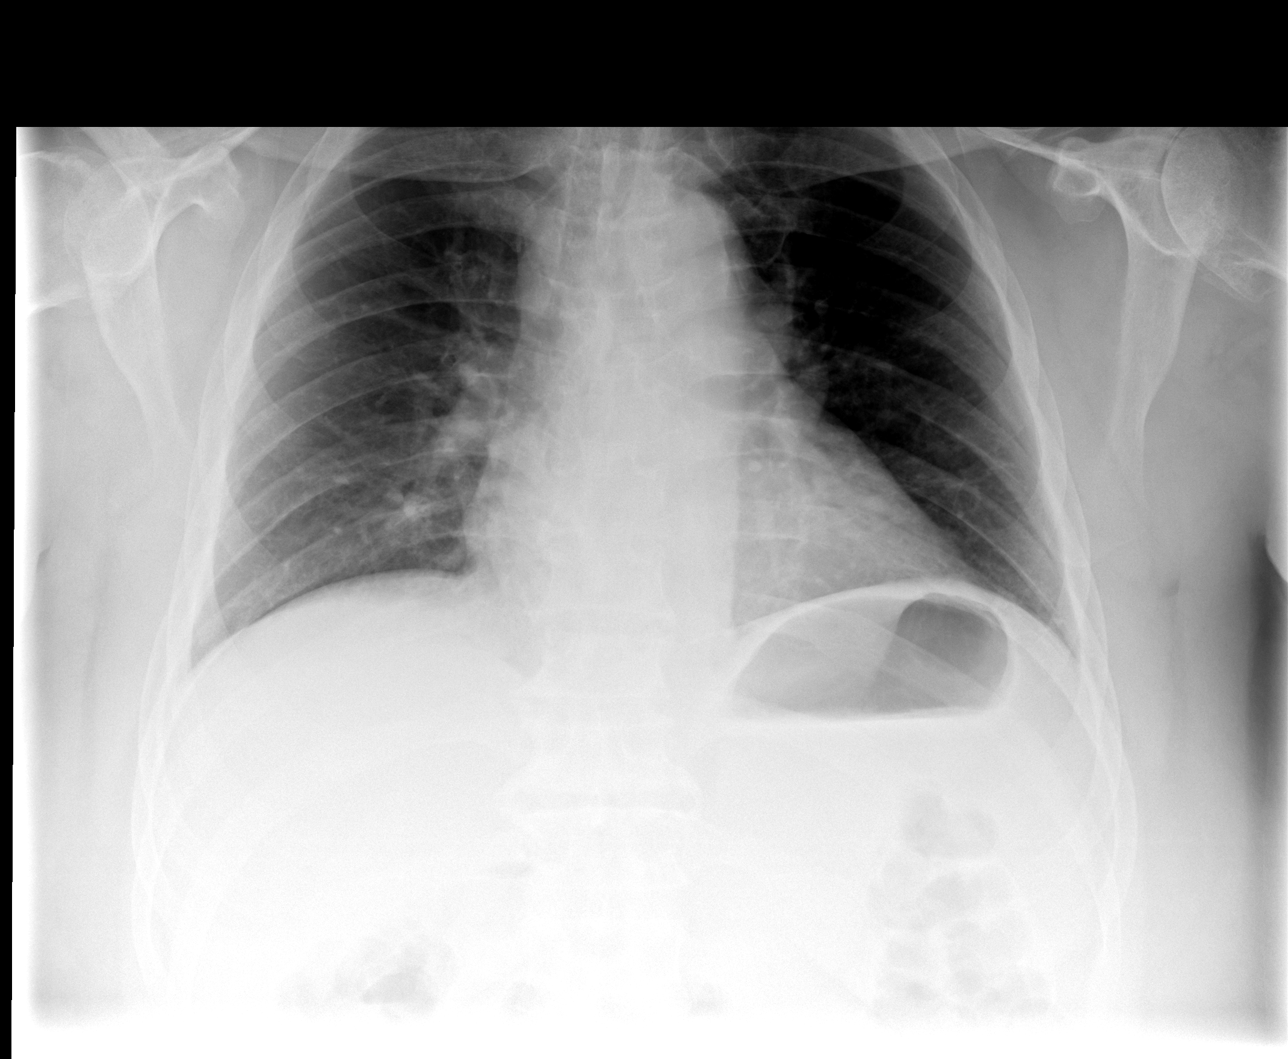

[view not recorded (2 of 4)]
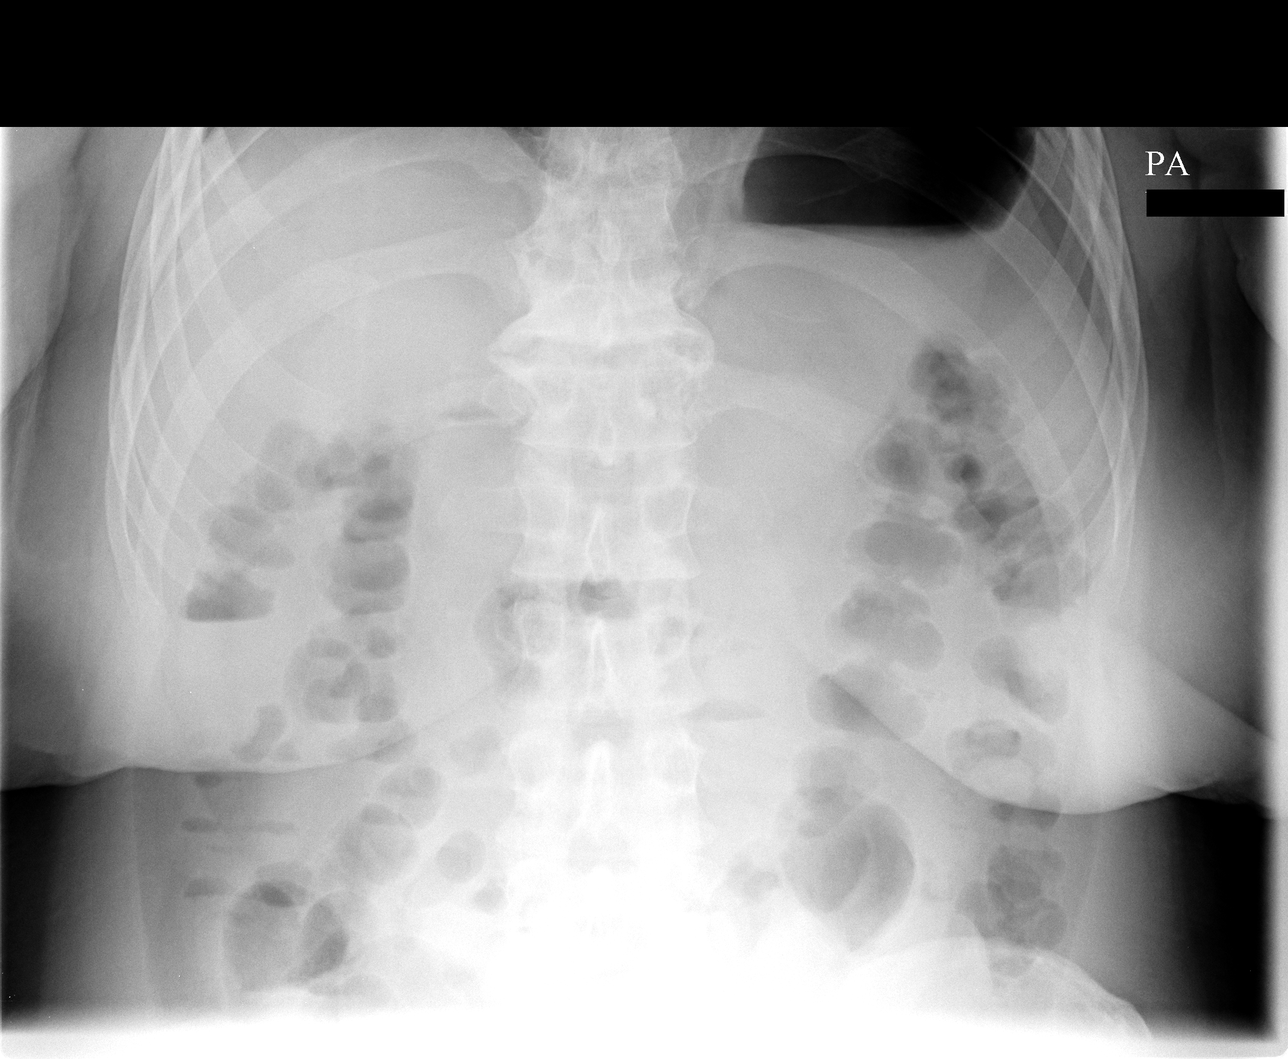

[view not recorded (3 of 4)]
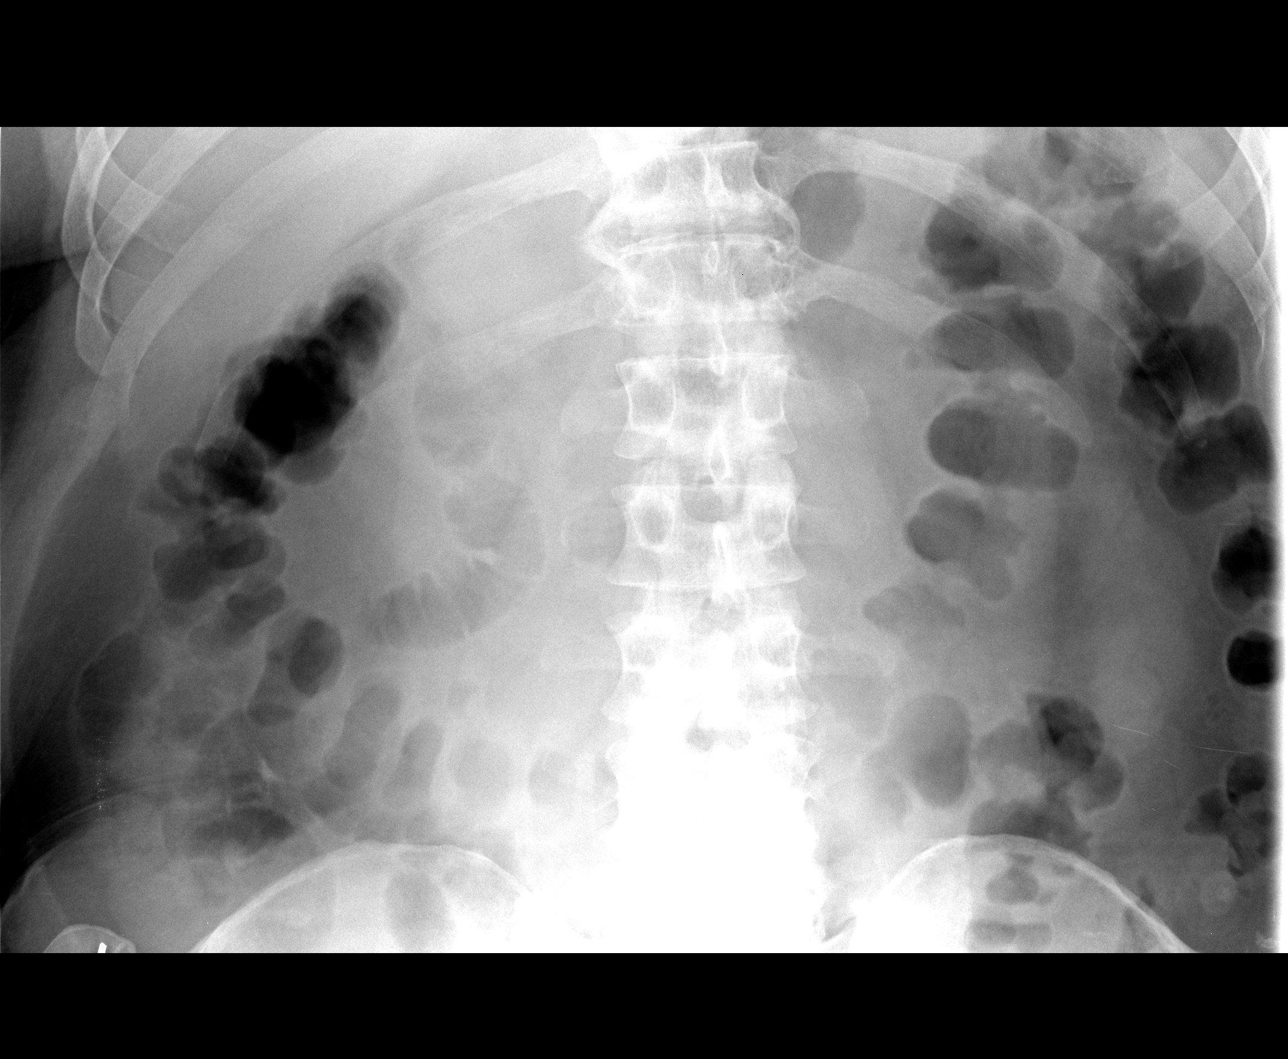

[view not recorded (4 of 4)]
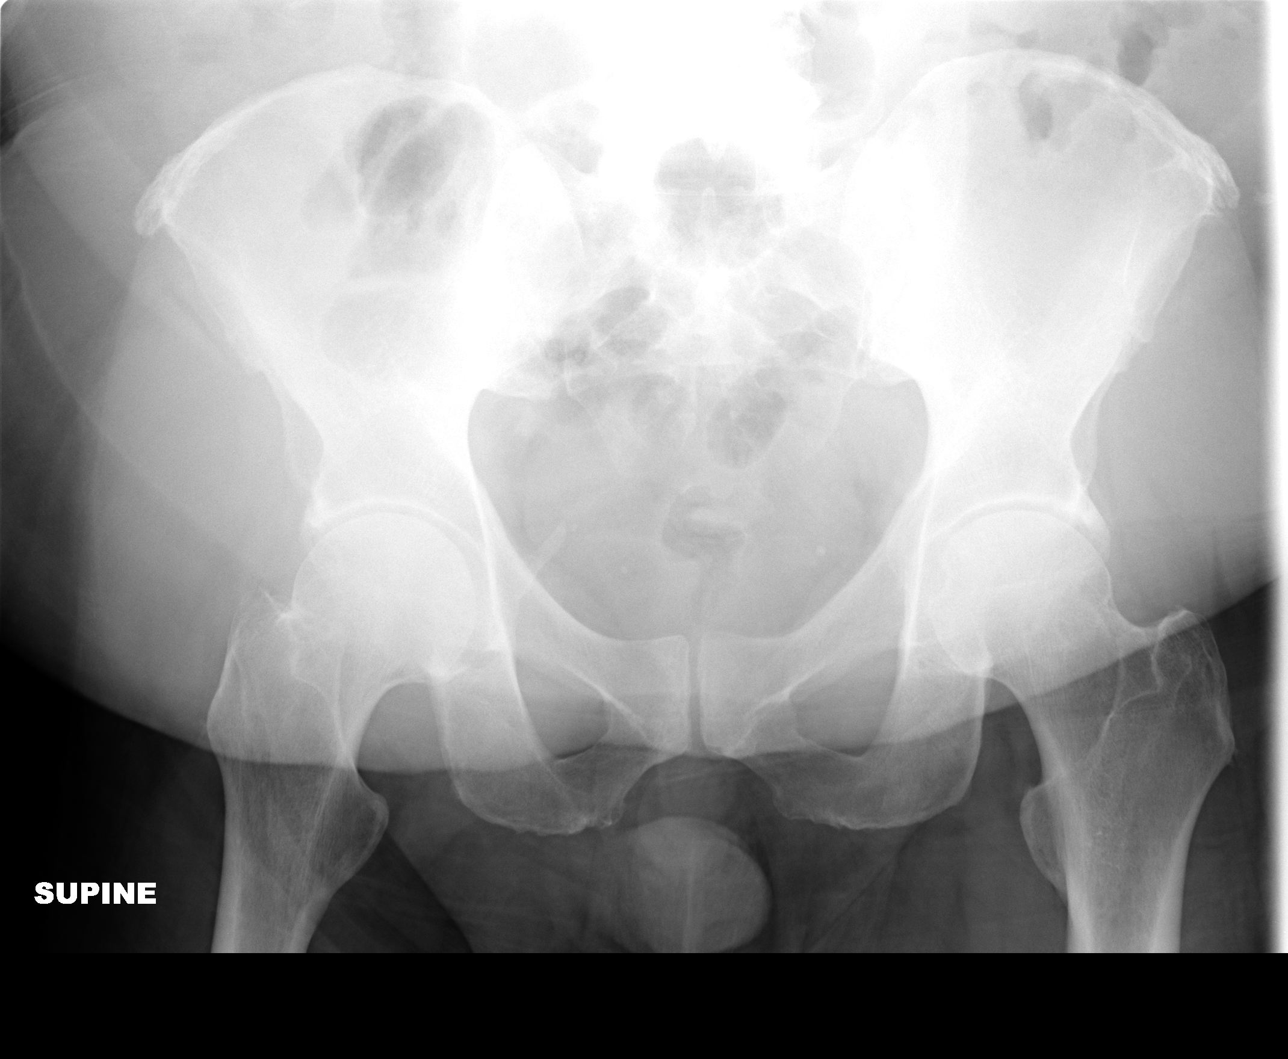

[4 of 4 positions shown; findings below may reference images not displayed]

FINDINGS: Mild cardiomegaly again noted.

The lungs are clear.

Nondistended gas-filled loops of colon and small bowel are
identified.

No dilated bowel loops or pneumoperitoneum noted.

No suspicious calcifications are present.

No acute bony abnormalities are identified.
IMPRESSION: Nonspecific nonobstructive bowel gas pattern - no evidence of
pneumoperitoneum.

No evidence of acute cardiopulmonary disease.

## 2014-10-03 ENCOUNTER — Emergency Department (HOSPITAL_COMMUNITY)
Admission: EM | Admit: 2014-10-03 | Discharge: 2014-10-03 | Disposition: A | Discharge status: Home / Self Care | Payer: Medicare Other | Attending: Emergency Medicine | Admitting: Emergency Medicine

## 2014-10-03 ENCOUNTER — Encounter (HOSPITAL_COMMUNITY): Payer: Self-pay | Admitting: Emergency Medicine

## 2014-10-03 DIAGNOSIS — E1140 Type 2 diabetes mellitus with diabetic neuropathy, unspecified: Secondary | ICD-10-CM | POA: Insufficient documentation

## 2014-10-03 DIAGNOSIS — Z79899 Other long term (current) drug therapy: Secondary | ICD-10-CM | POA: Diagnosis not present

## 2014-10-03 DIAGNOSIS — Z872 Personal history of diseases of the skin and subcutaneous tissue: Secondary | ICD-10-CM | POA: Insufficient documentation

## 2014-10-03 DIAGNOSIS — Z8719 Personal history of other diseases of the digestive system: Secondary | ICD-10-CM | POA: Diagnosis not present

## 2014-10-03 DIAGNOSIS — R42 Dizziness and giddiness: Secondary | ICD-10-CM | POA: Insufficient documentation

## 2014-10-03 DIAGNOSIS — G8929 Other chronic pain: Secondary | ICD-10-CM | POA: Insufficient documentation

## 2014-10-03 DIAGNOSIS — Z791 Long term (current) use of non-steroidal anti-inflammatories (NSAID): Secondary | ICD-10-CM | POA: Diagnosis not present

## 2014-10-03 DIAGNOSIS — R12 Heartburn: Secondary | ICD-10-CM | POA: Insufficient documentation

## 2014-10-03 DIAGNOSIS — R5381 Other malaise: Secondary | ICD-10-CM | POA: Insufficient documentation

## 2014-10-03 DIAGNOSIS — I129 Hypertensive chronic kidney disease with stage 1 through stage 4 chronic kidney disease, or unspecified chronic kidney disease: Secondary | ICD-10-CM | POA: Diagnosis not present

## 2014-10-03 DIAGNOSIS — N184 Chronic kidney disease, stage 4 (severe): Secondary | ICD-10-CM | POA: Insufficient documentation

## 2014-10-03 DIAGNOSIS — M109 Gout, unspecified: Secondary | ICD-10-CM | POA: Diagnosis not present

## 2014-10-03 DIAGNOSIS — M1990 Unspecified osteoarthritis, unspecified site: Secondary | ICD-10-CM | POA: Diagnosis not present

## 2014-10-03 DIAGNOSIS — H578 Other specified disorders of eye and adnexa: Secondary | ICD-10-CM | POA: Diagnosis not present

## 2014-10-03 DIAGNOSIS — Z87438 Personal history of other diseases of male genital organs: Secondary | ICD-10-CM | POA: Insufficient documentation

## 2014-10-03 DIAGNOSIS — E669 Obesity, unspecified: Secondary | ICD-10-CM | POA: Diagnosis not present

## 2014-10-03 DIAGNOSIS — Z862 Personal history of diseases of the blood and blood-forming organs and certain disorders involving the immune mechanism: Secondary | ICD-10-CM | POA: Diagnosis not present

## 2014-10-03 DIAGNOSIS — R51 Headache: Secondary | ICD-10-CM | POA: Diagnosis present

## 2014-10-03 NOTE — ED Notes (Signed)
MD at bedside. 

## 2014-10-03 NOTE — ED Notes (Signed)
Pt given urinal.

## 2014-10-03 NOTE — ED Notes (Addendum)
Pt c/o head pain with lower back pain. NAD noted. Pt states his eyes were red yesterday but they have cleared up today.

## 2014-10-03 NOTE — ED Provider Notes (Signed)
CSN: [id number]     Arrival date & time 10/03/14  1627 History   First MD Initiated Contact with Patient 10/03/14 1824     Chief Complaint  Patient presents with  . Headache  . Dizziness  . Heartburn     (Consider location/radiation/quality/duration/timing/severity/associated sxs/prior Treatment) HPI   Jack Irwin is a 49 y.o. male who states that he has headache, red eyes and dizziness, since not eating 2 days ago. He is not anything to eat and is very hungry now. He states that he has no money to buy food. He denies cough, fever, chills, chest pain, or focal weakness. There are no other known modifying factors.   Past Medical History  Diagnosis Date  . Hypertension   . Diabetes mellitus   . Gout   . Chronic back pain   . BPH (benign prostatic hyperplasia)   . Anemia   . DVT (deep venous thrombosis)   . Neuropathy   . Decubitus ulcer     of 2nd toes of both feet.  . Edema leg   . Constipation   . Venous (peripheral) insufficiency   . Neuropathy, diabetic   . Physical deconditioning   . Poor balance   . Difficulty walking   . Lack of coordination   . Arthritis   . Renal insufficiency   . Chronic kidney disease   . Chronic kidney disease (CKD), stage IV (severe)   . Constipation    Past Surgical History  Procedure Laterality Date  . None    . Insertion of dialysis catheter    . Cyst excision Right     cyst removed on thumb 2001  . Insertion of dialysis catheter Left 03/09/2014    Procedure: INSERTION OF DIALYSIS CATHETER;  Surgeon: Angelia Mould, MD;  Location: [place];  Service: Vascular;  Laterality: Left;  . Av fistula placement Left 03/09/2014    Procedure: INSERTION OF ARTERIOVENOUS (AV) GORE-TEX GRAFT ARM;  Surgeon: Angelia Mould, MD;  Location: [facility] OR;  Service: Vascular;  Laterality: Left;   Family History  Problem Relation Age of Onset  . Diabetes Mother   . Hypertension Mother   . Heart failure Mother   . Hyperlipidemia Mother   .  Heart attack Mother   . Cancer Father   . Diabetes Father   . Hypertension Father   . Hyperlipidemia Father    Social History  Substance Use Topics  . Smoking status: Never Smoker   . Smokeless tobacco: Never Used  . Alcohol Use: No    Review of Systems  All other systems reviewed and are negative.     Allergies  Review of patient's allergies indicates no known allergies.  Home Medications   Prior to Admission medications   Medication Sig Start Date End Date Taking? Authorizing Provider  calcium acetate (PHOSLO) 667 MG capsule Take 1 capsule by mouth 3 (three) times daily. 04/11/14   Historical Provider, MD  colchicine 0.6 MG tablet Take 0.5 tablets (0.3 mg total) by mouth 2 (two) times a week. Take 0.6mg  (one tablet) by mouth every 1-2 hours until one of the following occurs: 1.  The pain is gone 2.  The maximum dose has been given ( no more than 3 tabs in 3 hours or 10 tabs in 24 hours) 3.  The side effects outweight the benefits 04/12/14   Noemi Chapel, MD  diclofenac sodium (VOLTAREN) 1 % GEL Apply to left wrist and hand three times daily 04/13/14  Lily Kocher, PA-C  febuxostat (ULORIC) 40 MG tablet Take 3 tablets (120 mg total) by mouth daily. 10/04/13   Rolland Porter, MD  gabapentin (NEURONTIN) 100 MG capsule  07/11/14   Historical Provider, MD  ondansetron (ZOFRAN ODT) 4 MG disintegrating tablet 4mg  ODT q4 hours prn nausea/vomit 07/25/14   Milton Ferguson, MD  RENVELA 800 MG tablet  07/18/14   Historical Provider, MD  sildenafil (VIAGRA) 50 MG tablet Take 50 mg by mouth daily as needed for erectile dysfunction.    Historical Provider, MD   BP 111/77 mmHg  Pulse 80  Temp(Src) 97.6 F (36.4 C) (Oral)  Resp 18  Ht 5\' 11"  (1.803 m)  Wt 260 lb (117.935 kg)  BMI 36.28 kg/m2  SpO2 98% Physical Exam  Constitutional: He is oriented to person, place, and time. He appears well-developed.  Obese  HENT:  Head: Normocephalic and atraumatic.  Right Ear: External ear normal.  Left Ear:  External ear normal.  Mouth/Throat: Oropharynx is clear and moist.  Eyes: Conjunctivae and EOM are normal. Pupils are equal, round, and reactive to light. Right eye exhibits no discharge. Left eye exhibits no discharge. No scleral icterus.  Neck: Normal range of motion and phonation normal. Neck supple.  Cardiovascular: Normal rate, regular rhythm and normal heart sounds.   Pulmonary/Chest: Effort normal and breath sounds normal. He exhibits no bony tenderness.  Abdominal: Soft. There is no tenderness.  Musculoskeletal: Normal range of motion.  Neurological: He is alert and oriented to person, place, and time. No cranial nerve deficit or sensory deficit. He exhibits normal muscle tone. Coordination normal.  Skin: Skin is warm, dry and intact.  Psychiatric: He has a normal mood and affect. His behavior is normal. Judgment and thought content normal.  Nursing note and vitals reviewed.   ED Course  Procedures (including critical care time) Medications - No data to display  Patient Vitals for the past 24 hrs:  BP Temp Temp src Pulse Resp SpO2 Height Weight  10/03/14 1643 111/77 mmHg 97.6 F (36.4 C) Oral 80 18 98 % 5\' 11"  (1.803 m) 260 lb (117.935 kg)    8:17 PM Reevaluation with update and discussion. After initial assessment and treatment, an updated evaluation reveals he was able to eat, and feels better. No further complaints. Findings discussed with the patient, all questions were answered.. [place] Review Labs Reviewed - No data to display  Imaging Review No results found. I have personally reviewed and evaluated these images and lab results as part of my medical decision-making.   EKG Interpretation None      MDM   Final diagnoses:  Malaise    Patient with complaint of red eyes. No signs of acute conjunctivitis, allergic reaction, or metabolic instability.   Nursing Notes Reviewed/ Care Coordinated Applicable Imaging Reviewed Interpretation of  Laboratory Data incorporated into ED treatment  The patient appears reasonably screened and/or stabilized for discharge and I doubt any other medical condition or other [facility] requiring further screening, evaluation, or treatment in the ED at this time prior to discharge.  Plan: Home Medications- usual; Home Treatments- rest; return here if the recommended treatment, does not improve the symptoms; Recommended follow up- PCP prn     Daleen Bo, MD 10/03/14 2017

## 2014-10-03 NOTE — ED Notes (Signed)
C/oheadache and heartburn for last 2 days.  Rates pain 7/10.

## 2014-10-11 ENCOUNTER — Emergency Department (HOSPITAL_COMMUNITY)
Admission: EM | Admit: 2014-10-11 | Discharge: 2014-10-11 | Disposition: A | Discharge status: Home / Self Care | Payer: Medicare Other | Attending: Emergency Medicine | Admitting: Emergency Medicine

## 2014-10-11 ENCOUNTER — Encounter (HOSPITAL_COMMUNITY): Payer: Self-pay | Admitting: Emergency Medicine

## 2014-10-11 DIAGNOSIS — Z862 Personal history of diseases of the blood and blood-forming organs and certain disorders involving the immune mechanism: Secondary | ICD-10-CM | POA: Insufficient documentation

## 2014-10-11 DIAGNOSIS — E1140 Type 2 diabetes mellitus with diabetic neuropathy, unspecified: Secondary | ICD-10-CM | POA: Diagnosis not present

## 2014-10-11 DIAGNOSIS — M109 Gout, unspecified: Secondary | ICD-10-CM | POA: Insufficient documentation

## 2014-10-11 DIAGNOSIS — G8929 Other chronic pain: Secondary | ICD-10-CM | POA: Diagnosis not present

## 2014-10-11 DIAGNOSIS — K088 Other specified disorders of teeth and supporting structures: Secondary | ICD-10-CM | POA: Diagnosis present

## 2014-10-11 DIAGNOSIS — K029 Dental caries, unspecified: Secondary | ICD-10-CM | POA: Insufficient documentation

## 2014-10-11 DIAGNOSIS — M1990 Unspecified osteoarthritis, unspecified site: Secondary | ICD-10-CM | POA: Diagnosis not present

## 2014-10-11 DIAGNOSIS — K5900 Constipation, unspecified: Secondary | ICD-10-CM | POA: Diagnosis not present

## 2014-10-11 DIAGNOSIS — K047 Periapical abscess without sinus: Secondary | ICD-10-CM | POA: Diagnosis not present

## 2014-10-11 DIAGNOSIS — N184 Chronic kidney disease, stage 4 (severe): Secondary | ICD-10-CM | POA: Diagnosis not present

## 2014-10-11 DIAGNOSIS — Z872 Personal history of diseases of the skin and subcutaneous tissue: Secondary | ICD-10-CM | POA: Insufficient documentation

## 2014-10-11 DIAGNOSIS — Z86718 Personal history of other venous thrombosis and embolism: Secondary | ICD-10-CM | POA: Insufficient documentation

## 2014-10-11 DIAGNOSIS — I129 Hypertensive chronic kidney disease with stage 1 through stage 4 chronic kidney disease, or unspecified chronic kidney disease: Secondary | ICD-10-CM | POA: Diagnosis not present

## 2014-10-11 MED ORDER — HYDROCODONE-ACETAMINOPHEN 5-325 MG PO TABS: 1.0000 | ORAL_TABLET | Freq: Four times a day (QID) | ORAL | Status: DC | PRN

## 2014-10-11 MED ORDER — AMOXICILLIN 250 MG PO CAPS
500.0000 mg | ORAL_CAPSULE | Freq: Once | ORAL | Status: AC
  Administered 2014-10-11: 500 mg via ORAL
  Filled 2014-10-11: qty 2

## 2014-10-11 MED ORDER — HYDROCODONE-ACETAMINOPHEN 5-325 MG PO TABS
1.0000 | ORAL_TABLET | Freq: Once | ORAL | Status: AC
  Administered 2014-10-11: 1 via ORAL
  Filled 2014-10-11: qty 1

## 2014-10-11 MED ORDER — AMOXICILLIN 500 MG PO CAPS: 500.0000 mg | ORAL_CAPSULE | Freq: Three times a day (TID) | ORAL | Status: AC

## 2014-10-11 NOTE — ED Notes (Signed)
Patient complaining of dental pain to left upper. States has been bothering him for about 3 days and is causing him to have a headache.

## 2014-10-11 NOTE — Discharge Instructions (Signed)

## 2014-10-13 ENCOUNTER — Encounter (HOSPITAL_COMMUNITY): Payer: Self-pay | Admitting: Emergency Medicine

## 2014-10-13 ENCOUNTER — Emergency Department (HOSPITAL_COMMUNITY)
Admission: EM | Admit: 2014-10-13 | Discharge: 2014-10-13 | Disposition: A | Discharge status: Home / Self Care | Payer: Medicare Other | Attending: Emergency Medicine | Admitting: Emergency Medicine

## 2014-10-13 DIAGNOSIS — M1990 Unspecified osteoarthritis, unspecified site: Secondary | ICD-10-CM | POA: Diagnosis not present

## 2014-10-13 DIAGNOSIS — Z872 Personal history of diseases of the skin and subcutaneous tissue: Secondary | ICD-10-CM | POA: Insufficient documentation

## 2014-10-13 DIAGNOSIS — I129 Hypertensive chronic kidney disease with stage 1 through stage 4 chronic kidney disease, or unspecified chronic kidney disease: Secondary | ICD-10-CM | POA: Insufficient documentation

## 2014-10-13 DIAGNOSIS — Z862 Personal history of diseases of the blood and blood-forming organs and certain disorders involving the immune mechanism: Secondary | ICD-10-CM | POA: Diagnosis not present

## 2014-10-13 DIAGNOSIS — Z79899 Other long term (current) drug therapy: Secondary | ICD-10-CM | POA: Diagnosis not present

## 2014-10-13 DIAGNOSIS — Z86718 Personal history of other venous thrombosis and embolism: Secondary | ICD-10-CM | POA: Diagnosis not present

## 2014-10-13 DIAGNOSIS — G8929 Other chronic pain: Secondary | ICD-10-CM | POA: Insufficient documentation

## 2014-10-13 DIAGNOSIS — Z792 Long term (current) use of antibiotics: Secondary | ICD-10-CM | POA: Diagnosis not present

## 2014-10-13 DIAGNOSIS — K088 Other specified disorders of teeth and supporting structures: Secondary | ICD-10-CM | POA: Insufficient documentation

## 2014-10-13 DIAGNOSIS — E1140 Type 2 diabetes mellitus with diabetic neuropathy, unspecified: Secondary | ICD-10-CM | POA: Diagnosis not present

## 2014-10-13 DIAGNOSIS — Z8719 Personal history of other diseases of the digestive system: Secondary | ICD-10-CM | POA: Insufficient documentation

## 2014-10-13 DIAGNOSIS — Z791 Long term (current) use of non-steroidal anti-inflammatories (NSAID): Secondary | ICD-10-CM | POA: Insufficient documentation

## 2014-10-13 DIAGNOSIS — N184 Chronic kidney disease, stage 4 (severe): Secondary | ICD-10-CM | POA: Diagnosis not present

## 2014-10-13 DIAGNOSIS — G629 Polyneuropathy, unspecified: Secondary | ICD-10-CM | POA: Insufficient documentation

## 2014-10-13 DIAGNOSIS — K0889 Other specified disorders of teeth and supporting structures: Secondary | ICD-10-CM

## 2014-10-13 MED ORDER — HYDROCODONE-ACETAMINOPHEN 5-325 MG PO TABS
2.0000 | ORAL_TABLET | Freq: Once | ORAL | Status: AC
  Administered 2014-10-13: 2 via ORAL
  Filled 2014-10-13: qty 2

## 2014-10-13 MED ORDER — PREDNISONE 50 MG PO TABS
60.0000 mg | ORAL_TABLET | Freq: Once | ORAL | Status: AC
  Administered 2014-10-13: 60 mg via ORAL
  Filled 2014-10-13 (×2): qty 1

## 2014-10-13 MED ORDER — PREDNISONE 10 MG PO TABS: ORAL_TABLET | ORAL | Status: DC

## 2014-10-13 MED ORDER — ONDANSETRON HCL 4 MG PO TABS
4.0000 mg | ORAL_TABLET | Freq: Once | ORAL | Status: AC
  Administered 2014-10-13: 4 mg via ORAL
  Filled 2014-10-13: qty 1

## 2014-10-13 NOTE — ED Notes (Signed)
Patient with c/o dental pain/abscess. Seen several days ago for same. RX for amoxicillin and oxycodone per patient. Patient repeatedly stating "I need something stronger for pain, maybe percocet or something".

## 2014-10-13 NOTE — ED Notes (Signed)
In to give prescribed medications, patient eating hamburger, drinking and taking prescription pills from a prescription bottle. Patient upset over wait time and already stating he will need "good pain medication" before he leaves. [place] informed

## 2014-10-13 NOTE — ED Provider Notes (Signed)
CSN: [id number]     Arrival date & time 10/11/14  1914 History   First MD Initiated Contact with Patient 10/11/14 1928     Chief Complaint  Patient presents with  . Dental Pain  . Headache     (Consider location/radiation/quality/duration/timing/severity/associated sxs/prior Treatment) The history is provided by the patient.   KOLTIN MUZIO is a 49 y.o. male presenting with a 3 day history of left upper dental pain and gingival swelling.     The patient has a history of decay in the  tooth involved which has recently started to cause increased  Pain and swelling.  There has been no fevers, chills, nausea or vomiting, also no complaint of difficulty swallowing, although chewing makes pain worse. He describes shooting pain into his forehead from this toothache. The patient has tried tylenol without relief of symptoms. He does not see a dentist routinely.       Past Medical History  Diagnosis Date  . Hypertension   . Diabetes mellitus   . Gout   . Chronic back pain   . BPH (benign prostatic hyperplasia)   . Anemia   . DVT (deep venous thrombosis)   . Neuropathy   . Decubitus ulcer     of 2nd toes of both feet.  . Edema leg   . Constipation   . Venous (peripheral) insufficiency   . Neuropathy, diabetic   . Physical deconditioning   . Poor balance   . Difficulty walking   . Lack of coordination   . Arthritis   . Renal insufficiency   . Chronic kidney disease   . Chronic kidney disease (CKD), stage IV (severe)   . Constipation    Past Surgical History  Procedure Laterality Date  . None    . Insertion of dialysis catheter    . Cyst excision Right     cyst removed on thumb 2001  . Insertion of dialysis catheter Left 03/09/2014    Procedure: INSERTION OF DIALYSIS CATHETER;  Surgeon: Angelia Mould, MD;  Location: [place];  Service: Vascular;  Laterality: Left;  . Av fistula placement Left 03/09/2014    Procedure: INSERTION OF ARTERIOVENOUS (AV) GORE-TEX GRAFT ARM;   Surgeon: Angelia Mould, MD;  Location[facility] OR;  Service: Vascular;  Laterality: Left;   Family History  Problem Relation Age of Onset  . Diabetes Mother   . Hypertension Mother   . Heart failure Mother   . Hyperlipidemia Mother   . Heart attack Mother   . Cancer Father   . Diabetes Father   . Hypertension Father   . Hyperlipidemia Father    Social History  Substance Use Topics  . Smoking status: Never Smoker   . Smokeless tobacco: Never Used  . Alcohol Use: No    Review of Systems  Constitutional: Negative for fever.  HENT: Positive for dental problem. Negative for facial swelling and sore throat.   Respiratory: Negative for shortness of breath.   Musculoskeletal: Negative for neck pain and neck stiffness.      Allergies  Review of patient's allergies indicates no known allergies.  Home Medications   Prior to Admission medications   Medication Sig Start Date End Date Taking? Authorizing Provider  amoxicillin (AMOXIL) 500 MG capsule Take 1 capsule (500 mg total) by mouth 3 (three) times daily. 10/11/14 10/21/14  Evalee Jefferson, PA-C  calcium acetate (PHOSLO) 667 MG capsule Take 1 capsule by mouth 3 (three) times daily. 04/11/14   Historical Provider, MD  colchicine 0.6 MG tablet Take 0.5 tablets (0.3 mg total) by mouth 2 (two) times a week. Take 0.6mg  (one tablet) by mouth every 1-2 hours until one of the following occurs: 1.  The pain is gone 2.  The maximum dose has been given ( no more than 3 tabs in 3 hours or 10 tabs in 24 hours) 3.  The side effects outweight the benefits 04/12/14   Noemi Chapel, MD  diclofenac sodium (VOLTAREN) 1 % GEL Apply to left wrist and hand three times daily 04/13/14   Lily Kocher, PA-C  gabapentin (NEURONTIN) 100 MG capsule  07/11/14   Historical Provider, MD  HYDROcodone-acetaminophen (NORCO/VICODIN) 5-325 MG per tablet Take 1 tablet by mouth every 6 (six) hours as needed. 10/11/14   Evalee Jefferson, PA-C  ondansetron (ZOFRAN ODT) 4 MG disintegrating  tablet 4mg  ODT q4 hours prn nausea/vomit 07/25/14   Milton Ferguson, MD  predniSONE (DELTASONE) 10 MG tablet 5,4,3,2,1 - take with food 10/13/14   Lily Kocher, PA-C  RENVELA 800 MG tablet Take 800 mg by mouth 3 (three) times daily with meals.  07/18/14   Historical Provider, MD  sildenafil (VIAGRA) 50 MG tablet Take 50 mg by mouth daily as needed for erectile dysfunction.    Historical Provider, MD  ULORIC 80 MG TABS Take 80 mg by mouth daily. 09/26/14   Historical Provider, MD   BP 111/77 mmHg  Pulse 90  Temp(Src) 98.2 F (36.8 C) (Oral)  Resp 18  Ht 5\' 11"  (1.803 m)  Wt 262 lb (118.842 kg)  BMI 36.56 kg/m2  SpO2 97% Physical Exam  Constitutional: He is oriented to person, place, and time. He appears well-developed and well-nourished. No distress.  HENT:  Head: Normocephalic and atraumatic.  Right Ear: Tympanic membrane and external ear normal.  Left Ear: Tympanic membrane and external ear normal.  Mouth/Throat: Oropharynx is clear and moist and mucous membranes are normal. No oral lesions. No trismus in the jaw. Dental caries present.  Multiple areas of dental decay.  Poor dentition.  TTP with edema left upper gingiva along the base of the 1st and 2nd molar areas.  No fluctuance or palpable pus pocket.  No facial erythema or edema.  Eyes: Conjunctivae are normal.  Neck: Normal range of motion. Neck supple.  Cardiovascular: Normal rate and normal heart sounds.   Pulmonary/Chest: Effort normal.  Abdominal: He exhibits no distension.  Musculoskeletal: Normal range of motion.  Lymphadenopathy:    He has no cervical adenopathy.  Neurological: He is alert and oriented to person, place, and time.  Skin: Skin is warm and dry. No erythema.  Psychiatric: He has a normal mood and affect.    ED Course  Procedures (including critical care time) Labs Review Labs Reviewed - No data to display  Imaging Review No results found. I have personally reviewed and evaluated these images and lab  results as part of my medical decision-making.   EKG Interpretation None      MDM   Final diagnoses:  Dental abscess    Dental/gingival infection, probable early abscess in setting or multiple dental caries. Probable early abscess. Pt was placed on amoxil.  Given hydrocodone #8 for use until abx become effective. Dentist referrals given.    Evalee Jefferson, PA-C 10/13/14 2222  Daleen Bo, MD 10/22/14 [phone]

## 2014-10-13 NOTE — ED Provider Notes (Signed)
CSN: [id number]     Arrival date & time 10/13/14  1543 History   None    Chief Complaint  Patient presents with  . Dental Pain     (Consider location/radiation/quality/duration/timing/severity/associated sxs/prior Treatment) Patient is a 49 y.o. male presenting with tooth pain. The history is provided by the patient.  Dental Pain Location:  Upper Upper teeth location:  3/RU 1st molar Quality:  Aching Severity:  Moderate Onset quality:  Gradual Duration: "several days ago" Timing:  Intermittent Progression:  Worsening Chronicity:  New Context: dental caries   Relieved by:  Nothing Worsened by:  Cold food/drink Ineffective treatments:  Acetaminophen Associated symptoms: gum swelling   Associated symptoms: no difficulty swallowing, no drooling, no fever and no trismus     Past Medical History  Diagnosis Date  . Hypertension   . Diabetes mellitus   . Gout   . Chronic back pain   . BPH (benign prostatic hyperplasia)   . Anemia   . DVT (deep venous thrombosis)   . Neuropathy   . Decubitus ulcer     of 2nd toes of both feet.  . Edema leg   . Constipation   . Venous (peripheral) insufficiency   . Neuropathy, diabetic   . Physical deconditioning   . Poor balance   . Difficulty walking   . Lack of coordination   . Arthritis   . Renal insufficiency   . Chronic kidney disease   . Chronic kidney disease (CKD), stage IV (severe)   . Constipation    Past Surgical History  Procedure Laterality Date  . None    . Insertion of dialysis catheter    . Cyst excision Right     cyst removed on thumb 2001  . Insertion of dialysis catheter Left 03/09/2014    Procedure: INSERTION OF DIALYSIS CATHETER;  Surgeon: Angelia Mould, MD;  Location: [place];  Service: Vascular;  Laterality: Left;  . Av fistula placement Left 03/09/2014    Procedure: INSERTION OF ARTERIOVENOUS (AV) GORE-TEX GRAFT ARM;  Surgeon: Angelia Mould, MD;  Location: [facility] OR;  Service: Vascular;  Laterality:  Left;   Family History  Problem Relation Age of Onset  . Diabetes Mother   . Hypertension Mother   . Heart failure Mother   . Hyperlipidemia Mother   . Heart attack Mother   . Cancer Father   . Diabetes Father   . Hypertension Father   . Hyperlipidemia Father    Social History  Substance Use Topics  . Smoking status: Never Smoker   . Smokeless tobacco: Never Used  . Alcohol Use: No    Review of Systems  Constitutional: Negative for fever.  HENT: Positive for dental problem. Negative for drooling.   Musculoskeletal: Positive for back pain and arthralgias.  All other systems reviewed and are negative.     Allergies  Review of patient's allergies indicates no known allergies.  Home Medications   Prior to Admission medications   Medication Sig Start Date End Date Taking? Authorizing Provider  amoxicillin (AMOXIL) 500 MG capsule Take 1 capsule (500 mg total) by mouth 3 (three) times daily. 10/11/14 10/21/14  Evalee Jefferson, PA-C  calcium acetate (PHOSLO) 667 MG capsule Take 1 capsule by mouth 3 (three) times daily. 04/11/14   Historical Provider, MD  colchicine 0.6 MG tablet Take 0.5 tablets (0.3 mg total) by mouth 2 (two) times a week. Take 0.6mg  (one tablet) by mouth every 1-2 hours until one of the following occurs: 1.  The pain is gone 2.  The maximum dose has been given ( no more than 3 tabs in 3 hours or 10 tabs in 24 hours) 3.  The side effects outweight the benefits 04/12/14   Noemi Chapel, MD  diclofenac sodium (VOLTAREN) 1 % GEL Apply to left wrist and hand three times daily 04/13/14   Lily Kocher, PA-C  gabapentin (NEURONTIN) 100 MG capsule  07/11/14   Historical Provider, MD  HYDROcodone-acetaminophen (NORCO/VICODIN) 5-325 MG per tablet Take 1 tablet by mouth every 6 (six) hours as needed. 10/11/14   Evalee Jefferson, PA-C  ondansetron (ZOFRAN ODT) 4 MG disintegrating tablet 4mg  ODT q4 hours prn nausea/vomit 07/25/14   Milton Ferguson, MD  RENVELA 800 MG tablet  07/18/14   Historical  Provider, MD  sildenafil (VIAGRA) 50 MG tablet Take 50 mg by mouth daily as needed for erectile dysfunction.    Historical Provider, MD  ULORIC 80 MG TABS Take 80 mg by mouth daily. 09/26/14   Historical Provider, MD   BP 162/99 mmHg  Pulse 92  Temp(Src) 98.2 F (36.8 C) (Oral)  Resp 20  Ht 5\' 11"  (1.803 m)  Wt 262 lb (118.842 kg)  BMI 36.56 kg/m2  SpO2 100% Physical Exam  Constitutional: He is oriented to person, place, and time. He appears well-developed and well-nourished.  Non-toxic appearance.  HENT:  Head: Normocephalic.  Right Ear: Tympanic membrane and external ear normal.  Left Ear: Tympanic membrane and external ear normal.  Multiple dental caries noted. There is swelling about the right upper gum. No visible abscess noted. The airway is patent. There is no swelling under the tongue.  Eyes: EOM and lids are normal. Pupils are equal, round, and reactive to light.  Neck: Normal range of motion. Neck supple. Carotid bruit is not present.  Cardiovascular: Normal rate, regular rhythm, normal heart sounds, intact distal pulses and normal pulses.   Pulmonary/Chest: Breath sounds normal. No respiratory distress.  Abdominal: Soft. Bowel sounds are normal. There is no tenderness. There is no guarding.  Musculoskeletal: Normal range of motion.  Lymphadenopathy:       Head (right side): No submandibular adenopathy present.       Head (left side): No submandibular adenopathy present.    He has no cervical adenopathy.  Neurological: He is alert and oriented to person, place, and time. He has normal strength. No cranial nerve deficit or sensory deficit.  Skin: Skin is warm and dry.  Psychiatric: He has a normal mood and affect. His speech is normal.  Nursing note and vitals reviewed.   ED Course  Procedures (including critical care time) Labs Review Labs Reviewed - No data to display  Imaging Review No results found. I have personally reviewed and evaluated these images and lab  results as part of my medical decision-making.   EKG Interpretation None      MDM  Patient presents to the emergency department requesting additional medications for pain. I discussed with the patient that he would need to have this discussion with his primary physician. The patient is given to Norco tablets here in the emergency department. He requests to have some prednisone temp with the swelling. He states this has helped him in the past. We discussed the need for him to follow this closely as he is a diabetic patient. The patient is further advised to follow-up with his primary physician as sone as possible. He will continue his Amoxil for now.    Final diagnoses:  None    **  I have reviewed nursing notes, vital signs, and all appropriate lab and imaging results for this patient.Lily Kocher, PA-C 10/13/14 1901  Fredia Sorrow, MD 10/14/14 [phone]

## 2014-10-13 NOTE — Discharge Instructions (Signed)
Please continue your Amoxil. Please monitor your glucose closely while you are taking the prednisone taper. Please discuss your pain management with Dr.Avbuere. It is important that she see a dentist as sone as possible. Dental Pain Toothache is pain in or around a tooth. It may get worse with chewing or with cold or heat.  HOME CARE  Your dentist may use a numbing medicine during treatment. If so, you may need to avoid eating until the medicine wears off. Ask your dentist about this.  Only take medicine as told by your dentist or doctor.  Avoid chewing food near the painful tooth until after all treatment is done. Ask your dentist about this. GET HELP RIGHT AWAY IF:   The problem gets worse or new problems appear.  You have a fever.  There is redness and puffiness (swelling) of the face, jaw, or neck.  You cannot open your mouth.  There is pain in the jaw.  There is very bad pain that is not helped by medicine. MAKE SURE YOU:   Understand these instructions.  Will watch your condition.  Will get help right away if you are not doing well or get worse. Document Released: 07/09/2007 Document Revised: 04/14/2011 Document Reviewed: 07/09/2007 [facility] Patient Information 2015 [place], [place]. This information is not intended to replace advice given to you by your health care provider. Make sure you discuss any questions you have with your health care provider.

## 2014-10-13 NOTE — ED Notes (Signed)
Discharge instructions including prescriptions reviewed with patient. Patient aware he can not drive due to receiving Hydrocodone, patient calling wife to come pick him up.

## 2014-10-28 ENCOUNTER — Encounter (HOSPITAL_COMMUNITY): Payer: Self-pay | Admitting: *Deleted

## 2014-10-28 ENCOUNTER — Emergency Department (HOSPITAL_COMMUNITY)
Admission: EM | Admit: 2014-10-28 | Discharge: 2014-10-28 | Disposition: A | Discharge status: Home / Self Care | Payer: Medicare Other | Attending: Emergency Medicine | Admitting: Emergency Medicine

## 2014-10-28 DIAGNOSIS — M109 Gout, unspecified: Secondary | ICD-10-CM | POA: Insufficient documentation

## 2014-10-28 DIAGNOSIS — G8929 Other chronic pain: Secondary | ICD-10-CM | POA: Insufficient documentation

## 2014-10-28 DIAGNOSIS — Z862 Personal history of diseases of the blood and blood-forming organs and certain disorders involving the immune mechanism: Secondary | ICD-10-CM | POA: Insufficient documentation

## 2014-10-28 DIAGNOSIS — N184 Chronic kidney disease, stage 4 (severe): Secondary | ICD-10-CM | POA: Diagnosis not present

## 2014-10-28 DIAGNOSIS — Z87438 Personal history of other diseases of male genital organs: Secondary | ICD-10-CM | POA: Insufficient documentation

## 2014-10-28 DIAGNOSIS — Z86718 Personal history of other venous thrombosis and embolism: Secondary | ICD-10-CM | POA: Insufficient documentation

## 2014-10-28 DIAGNOSIS — Z79899 Other long term (current) drug therapy: Secondary | ICD-10-CM | POA: Diagnosis not present

## 2014-10-28 DIAGNOSIS — Z791 Long term (current) use of non-steroidal anti-inflammatories (NSAID): Secondary | ICD-10-CM | POA: Insufficient documentation

## 2014-10-28 DIAGNOSIS — M7989 Other specified soft tissue disorders: Secondary | ICD-10-CM | POA: Diagnosis not present

## 2014-10-28 DIAGNOSIS — K0889 Other specified disorders of teeth and supporting structures: Secondary | ICD-10-CM

## 2014-10-28 DIAGNOSIS — K088 Other specified disorders of teeth and supporting structures: Secondary | ICD-10-CM | POA: Diagnosis present

## 2014-10-28 DIAGNOSIS — Z872 Personal history of diseases of the skin and subcutaneous tissue: Secondary | ICD-10-CM | POA: Diagnosis not present

## 2014-10-28 DIAGNOSIS — I129 Hypertensive chronic kidney disease with stage 1 through stage 4 chronic kidney disease, or unspecified chronic kidney disease: Secondary | ICD-10-CM | POA: Insufficient documentation

## 2014-10-28 DIAGNOSIS — E1140 Type 2 diabetes mellitus with diabetic neuropathy, unspecified: Secondary | ICD-10-CM | POA: Diagnosis not present

## 2014-10-28 DIAGNOSIS — M1990 Unspecified osteoarthritis, unspecified site: Secondary | ICD-10-CM | POA: Insufficient documentation

## 2014-10-28 DIAGNOSIS — M2540 Effusion, unspecified joint: Secondary | ICD-10-CM

## 2014-10-28 DIAGNOSIS — R21 Rash and other nonspecific skin eruption: Secondary | ICD-10-CM | POA: Diagnosis not present

## 2014-10-28 MED ORDER — PREDNISONE 50 MG PO TABS: ORAL_TABLET | ORAL | Status: DC

## 2014-10-28 NOTE — ED Notes (Signed)
Pt states he no longer has PCP. States he needs prednisone and pain medication for gout. States swelling to hands. States dental pain. Rash to bottom.

## 2014-10-28 NOTE — ED Provider Notes (Signed)
CSN: [id number]     Arrival date & time 10/28/14  1241 History   First MD Initiated Contact with Patient 10/28/14 1248     Chief Complaint  Patient presents with  . multiple complaints      The history is provided by the patient.  pt here for multiple complaints:  1. He reports dental pain for several days, he has taken antibiotics without improvement.  No fever/vomiting  2. He reports pain/swelling to both hands- report it feels like gout but does not have medicine due to losing PCP.  He reports his pain/swelling are worsening  3.  He also report rash to buttocks, reports it feels like "dry skin"  Past Medical History  Diagnosis Date  . Hypertension   . Diabetes mellitus   . Gout   . Chronic back pain   . BPH (benign prostatic hyperplasia)   . Anemia   . DVT (deep venous thrombosis)   . Neuropathy   . Decubitus ulcer     of 2nd toes of both feet.  . Edema leg   . Constipation   . Venous (peripheral) insufficiency   . Neuropathy, diabetic   . Physical deconditioning   . Poor balance   . Difficulty walking   . Lack of coordination   . Arthritis   . Renal insufficiency   . Chronic kidney disease   . Chronic kidney disease (CKD), stage IV (severe)   . Constipation    Past Surgical History  Procedure Laterality Date  . None    . Insertion of dialysis catheter    . Cyst excision Right     cyst removed on thumb 2001  . Insertion of dialysis catheter Left 03/09/2014    Procedure: INSERTION OF DIALYSIS CATHETER;  Surgeon: Angelia Mould, MD;  Location: [facility];  Service: Vascular;  Laterality: Left;  . Av fistula placement Left 03/09/2014    Procedure: INSERTION OF ARTERIOVENOUS (AV) GORE-TEX GRAFT ARM;  Surgeon: Angelia Mould, MD;  Location: [facility] OR;  Service: Vascular;  Laterality: Left;   Family History  Problem Relation Age of Onset  . Diabetes Mother   . Hypertension Mother   . Heart failure Mother   . Hyperlipidemia Mother   . Heart attack Mother   .  Cancer Father   . Diabetes Father   . Hypertension Father   . Hyperlipidemia Father    Social History  Substance Use Topics  . Smoking status: Never Smoker   . Smokeless tobacco: Never Used  . Alcohol Use: No    Review of Systems  Constitutional: Negative for fever.  Musculoskeletal: Positive for arthralgias.      Allergies  Review of patient's allergies indicates no known allergies.  Home Medications   Prior to Admission medications   Medication Sig Start Date End Date Taking? Authorizing Provider  calcium acetate (PHOSLO) 667 MG capsule Take 1 capsule by mouth 3 (three) times daily. 04/11/14  Yes Historical Provider, MD  clotrimazole (LOTRIMIN) 1 % cream Apply 1 application topically daily as needed. rash 10/03/14  Yes Historical Provider, MD  diclofenac sodium (VOLTAREN) 1 % GEL Apply to left wrist and hand three times daily 04/13/14  Yes Lily Kocher, PA-C  gabapentin (NEURONTIN) 100 MG capsule Take 100 mg by mouth daily.  07/11/14  Yes Historical Provider, MD  RENVELA 800 MG tablet Take 800 mg by mouth 3 (three) times daily with meals.  07/18/14  Yes Historical Provider, MD  sildenafil (VIAGRA) 50 MG tablet Take  50 mg by mouth daily as needed for erectile dysfunction.   Yes Historical Provider, MD  ULORIC 80 MG TABS Take 80 mg by mouth daily. 09/26/14  Yes Historical Provider, MD  colchicine 0.6 MG tablet Take 0.5 tablets (0.3 mg total) by mouth 2 (two) times a week. Take 0.6mg  (one tablet) by mouth every 1-2 hours until one of the following occurs: 1.  The pain is gone 2.  The maximum dose has been given ( no more than 3 tabs in 3 hours or 10 tabs in 24 hours) 3.  The side effects outweight the benefits 04/12/14   Noemi Chapel, MD  predniSONE (DELTASONE) 50 MG tablet One tablet PO daily 4 days 10/28/14   Ripley Fraise, MD   BP 124/83 mmHg  Pulse 115  Temp(Src) 98.2 F (36.8 C) (Oral)  Resp 18  Ht 5\' 11"  (1.803 m)  Wt 260 lb (117.935 kg)  BMI 36.28 kg/m2  SpO2  99% Physical Exam CONSTITUTIONAL: Well developed/well nourished HEAD: Normocephalic/atraumatic EYES: EOMI ENMT: Mucous membranes moist, no dental abscess noted, no trismus, poor dentition NECK: supple no meningeal signs CV: S1/S2 noted, no murmurs/rubs/gallops noted LUNGS: Lungs are clear to auscultation bilaterally, no apparent distress ABDOMEN: soft, nontender, no rebound or guarding, bowel sounds noted throughout abdomen NEURO: Pt is awake/alert/appropriate, moves all extremitiesx4.  EXTREMITIES: pulses normal/equal, full ROM.  Mild tenderness/edema to both hands.  No erythema noted. SKIN: warm, color normal, dry skin noted to buttocks, no abscess/erythema noted PSYCH: no abnormalities of mood noted, alert and oriented to situation  ED Course  Procedures   Pt reports hand symptoms similar to prior flares of gout Short course of prednisone given PCP referral info given to patient  MDM   Final diagnoses:  Joint swelling  Pain, dental  Rash    Nursing notes including past medical history and social history reviewed and considered in documentation     Ripley Fraise, MD 10/28/14 1320

## 2014-11-01 ENCOUNTER — Telehealth (HOSPITAL_COMMUNITY): Payer: Self-pay | Admitting: Physical Therapy

## 2014-11-01 NOTE — Telephone Encounter (Signed)
He has to move and can not come in today rescheduled for Oct 13, please call to remind pt the day before

## 2014-11-02 ENCOUNTER — Ambulatory Visit (HOSPITAL_COMMUNITY): Payer: Medicare Other | Admitting: Physical Therapy

## 2014-11-11 ENCOUNTER — Emergency Department (HOSPITAL_COMMUNITY)
Admission: EM | Admit: 2014-11-11 | Discharge: 2014-11-11 | Disposition: A | Discharge status: Home / Self Care | Payer: Medicare Other | Attending: Emergency Medicine | Admitting: Emergency Medicine

## 2014-11-11 ENCOUNTER — Encounter (HOSPITAL_COMMUNITY): Payer: Self-pay | Admitting: Emergency Medicine

## 2014-11-11 DIAGNOSIS — N184 Chronic kidney disease, stage 4 (severe): Secondary | ICD-10-CM | POA: Diagnosis not present

## 2014-11-11 DIAGNOSIS — E1140 Type 2 diabetes mellitus with diabetic neuropathy, unspecified: Secondary | ICD-10-CM | POA: Diagnosis not present

## 2014-11-11 DIAGNOSIS — Z862 Personal history of diseases of the blood and blood-forming organs and certain disorders involving the immune mechanism: Secondary | ICD-10-CM | POA: Diagnosis not present

## 2014-11-11 DIAGNOSIS — M1990 Unspecified osteoarthritis, unspecified site: Secondary | ICD-10-CM | POA: Diagnosis not present

## 2014-11-11 DIAGNOSIS — I129 Hypertensive chronic kidney disease with stage 1 through stage 4 chronic kidney disease, or unspecified chronic kidney disease: Secondary | ICD-10-CM | POA: Diagnosis not present

## 2014-11-11 DIAGNOSIS — K0889 Other specified disorders of teeth and supporting structures: Secondary | ICD-10-CM

## 2014-11-11 DIAGNOSIS — Z79899 Other long term (current) drug therapy: Secondary | ICD-10-CM | POA: Insufficient documentation

## 2014-11-11 DIAGNOSIS — Z791 Long term (current) use of non-steroidal anti-inflammatories (NSAID): Secondary | ICD-10-CM | POA: Diagnosis not present

## 2014-11-11 DIAGNOSIS — L729 Follicular cyst of the skin and subcutaneous tissue, unspecified: Secondary | ICD-10-CM

## 2014-11-11 DIAGNOSIS — Z992 Dependence on renal dialysis: Secondary | ICD-10-CM | POA: Diagnosis not present

## 2014-11-11 DIAGNOSIS — Z872 Personal history of diseases of the skin and subcutaneous tissue: Secondary | ICD-10-CM | POA: Diagnosis not present

## 2014-11-11 DIAGNOSIS — K5900 Constipation, unspecified: Secondary | ICD-10-CM | POA: Diagnosis not present

## 2014-11-11 DIAGNOSIS — G8929 Other chronic pain: Secondary | ICD-10-CM | POA: Insufficient documentation

## 2014-11-11 DIAGNOSIS — G629 Polyneuropathy, unspecified: Secondary | ICD-10-CM | POA: Insufficient documentation

## 2014-11-11 DIAGNOSIS — M109 Gout, unspecified: Secondary | ICD-10-CM | POA: Insufficient documentation

## 2014-11-11 MED ORDER — DOXYCYCLINE HYCLATE 100 MG PO CAPS: 100.0000 mg | ORAL_CAPSULE | Freq: Two times a day (BID) | ORAL | Status: DC

## 2014-11-11 MED ORDER — PREDNISONE 20 MG PO TABS
40.0000 mg | ORAL_TABLET | Freq: Once | ORAL | Status: AC
  Administered 2014-11-11: 40 mg via ORAL
  Filled 2014-11-11: qty 2

## 2014-11-11 MED ORDER — HYDROCODONE-ACETAMINOPHEN 5-325 MG PO TABS
1.0000 | ORAL_TABLET | Freq: Once | ORAL | Status: AC
  Administered 2014-11-11: 1 via ORAL
  Filled 2014-11-11: qty 1

## 2014-11-11 MED ORDER — HYDROCODONE-ACETAMINOPHEN 5-325 MG PO TABS: 1.0000 | ORAL_TABLET | Freq: Two times a day (BID) | ORAL | Status: DC | PRN

## 2014-11-11 MED ORDER — PREDNISONE 10 MG PO TABS: 40.0000 mg | ORAL_TABLET | Freq: Every day | ORAL | Status: DC

## 2014-11-11 MED ORDER — DOXYCYCLINE HYCLATE 100 MG PO TABS
100.0000 mg | ORAL_TABLET | Freq: Once | ORAL | Status: AC
  Administered 2014-11-11: 100 mg via ORAL
  Filled 2014-11-11: qty 1

## 2014-11-11 NOTE — ED Notes (Signed)
Upon patient assessment, pt states that his tooth hurts too bad to talk. Will not remove pants for exam.  States that the doctor can see what he needs to with him just pulling them down.  Keeps repeating that he needs antibiotics, steroids, and pain pills.

## 2014-11-11 NOTE — ED Notes (Signed)
Pt reports left sided dental pain x2 days and a "bump" on scrotum x1 week. nad noted.

## 2014-11-11 NOTE — ED Provider Notes (Signed)
CSN: [id number]     Arrival date & time 11/11/14  1555 History   First MD Initiated Contact with Patient 11/11/14 1627     Chief Complaint  Patient presents with  . Abscess     (Consider location/radiation/quality/duration/timing/severity/associated sxs/prior Treatment) Patient is a 49 y.o. male presenting with abscess. The history is provided by the patient.  Abscess Associated symptoms: no fever and no headaches    patient is a dialysis patient. Normally dialyzed Monday Wednesdays and Fridays but missed yesterday so was dialyzed today. Patient here with complaint of left upper tooth pain for 2 days and a bump on the left scrotum for a week. Patient receiving dialysis for 6 months. Patient has frequent dental pain problems. Normally treated with penicillin and prednisone. Patient states that the left scrotal swelling or bump was 3 times the size 2 days ago and now it's starting to shrink in size is much smaller. Patient states that the tooth pain is 10 out of 10. Scrotal discomfort is about 4 out of 10.  Past Medical History  Diagnosis Date  . Hypertension   . Diabetes mellitus   . Gout   . Chronic back pain   . BPH (benign prostatic hyperplasia)   . Anemia   . DVT (deep venous thrombosis) ([place])   . Neuropathy ([place])   . Decubitus ulcer     of 2nd toes of both feet.  . Edema leg   . Constipation   . Venous (peripheral) insufficiency   . Neuropathy, diabetic ([place])   . Physical deconditioning   . Poor balance   . Difficulty walking   . Lack of coordination   . Arthritis   . Renal insufficiency   . Chronic kidney disease   . Chronic kidney disease (CKD), stage IV (severe) ([place])   . Constipation    Past Surgical History  Procedure Laterality Date  . None    . Insertion of dialysis catheter    . Cyst excision Right     cyst removed on thumb 2001  . Insertion of dialysis catheter Left 03/09/2014    Procedure: INSERTION OF DIALYSIS CATHETER;  Surgeon: Angelia Mould, MD;   Location: [facility];  Service: Vascular;  Laterality: Left;  . Av fistula placement Left 03/09/2014    Procedure: INSERTION OF ARTERIOVENOUS (AV) GORE-TEX GRAFT ARM;  Surgeon: Angelia Mould, MD;  Location: [facility] OR;  Service: Vascular;  Laterality: Left;   Family History  Problem Relation Age of Onset  . Diabetes Mother   . Hypertension Mother   . Heart failure Mother   . Hyperlipidemia Mother   . Heart attack Mother   . Cancer Father   . Diabetes Father   . Hypertension Father   . Hyperlipidemia Father    Social History  Substance Use Topics  . Smoking status: Never Smoker   . Smokeless tobacco: Never Used  . Alcohol Use: No    Review of Systems  Constitutional: Negative for fever.  HENT: Positive for dental problem. Negative for congestion.   Eyes: Negative for redness.  Respiratory: Negative for shortness of breath.   Cardiovascular: Negative for chest pain.  Gastrointestinal: Negative for abdominal pain.  Genitourinary: Positive for scrotal swelling. Negative for dysuria.  Musculoskeletal: Negative for back pain.  Skin: Negative for rash.  Neurological: Negative for headaches.  Hematological: Does not bruise/bleed easily.  Psychiatric/Behavioral: Negative for confusion.      Allergies  Review of patient's allergies indicates no known allergies.  Home Medications  Prior to Admission medications   Medication Sig Start Date End Date Taking? Authorizing Provider  calcium acetate (PHOSLO) 667 MG capsule Take 1 capsule by mouth 3 (three) times daily. 04/11/14   Historical Provider, MD  clotrimazole (LOTRIMIN) 1 % cream Apply 1 application topically daily as needed. rash 10/03/14   Historical Provider, MD  colchicine 0.6 MG tablet Take 0.5 tablets (0.3 mg total) by mouth 2 (two) times a week. Take 0.6mg  (one tablet) by mouth every 1-2 hours until one of the following occurs: 1.  The pain is gone 2.  The maximum dose has been given ( no more than 3 tabs in 3 hours or 10  tabs in 24 hours) 3.  The side effects outweight the benefits 04/12/14   Noemi Chapel, MD  diclofenac sodium (VOLTAREN) 1 % GEL Apply to left wrist and hand three times daily 04/13/14   Lily Kocher, PA-C  doxycycline (VIBRAMYCIN) 100 MG capsule Take 1 capsule (100 mg total) by mouth 2 (two) times daily. 11/11/14   Fredia Sorrow, MD  gabapentin (NEURONTIN) 100 MG capsule Take 100 mg by mouth daily.  07/11/14   Historical Provider, MD  HYDROcodone-acetaminophen (NORCO/VICODIN) 5-325 MG tablet Take 1-2 tablets by mouth 2 (two) times daily as needed for moderate pain. 11/11/14   Fredia Sorrow, MD  predniSONE (DELTASONE) 10 MG tablet Take 4 tablets (40 mg total) by mouth daily. 11/11/14   Fredia Sorrow, MD  predniSONE (DELTASONE) 50 MG tablet One tablet PO daily 4 days 10/28/14   Ripley Fraise, MD  RENVELA 800 MG tablet Take 800 mg by mouth 3 (three) times daily with meals.  07/18/14   Historical Provider, MD  sildenafil (VIAGRA) 50 MG tablet Take 50 mg by mouth daily as needed for erectile dysfunction.    Historical Provider, MD  ULORIC 80 MG TABS Take 80 mg by mouth daily. 09/26/14   Historical Provider, MD   BP 149/96 mmHg  Pulse 87  Temp(Src) 98.2 F (36.8 C) (Oral)  Resp 20  Ht 5\' 11"  (1.803 m)  Wt 262 lb (118.842 kg)  BMI 36.56 kg/m2  SpO2 100% Physical Exam  Constitutional: He is oriented to person, place, and time. He appears well-developed and well-nourished. No distress.  HENT:  Head: Normocephalic and atraumatic.  Mouth/Throat: Oropharynx is clear and moist.  Tenderness to palpation of the left upper premolar. No gum swelling no discharge. No facial swelling.  Eyes: Conjunctivae and EOM are normal. Pupils are equal, round, and reactive to light.  Neck: Normal range of motion. Neck supple.  Cardiovascular: Normal rate, regular rhythm and normal heart sounds.   No murmur heard. Pulmonary/Chest: Effort normal and breath sounds normal. No respiratory distress.  Abdominal: Soft. Bowel  sounds are normal. There is no tenderness.  Genitourinary: Penis normal.  Left scrotum with a spherical cyst 1 cm in size induration no fluctuance not pointing to ahead. No scrotal swelling. Testicle nontender.  Musculoskeletal: Normal range of motion.  Neurological: He is alert and oriented to person, place, and time. No cranial nerve deficit. He exhibits normal muscle tone. Coordination normal.  Nursing note and vitals reviewed.   ED Course  Procedures (including critical care time) Labs Review Labs Reviewed - No data to display  Imaging Review No results found. I have personally reviewed and evaluated these images and lab results as part of my medical decision-making.   EKG Interpretation None      MDM   Final diagnoses:  Scrotal cyst  Toothache  Patient here for 2 complaints. Patient had dialysis today, although he is normally dialyzed Monday Wednesdays and Fridays. Patient with complaint of left upper premolar tooth pain. No evidence of any significant swelling or infection there. Patient also with complaint of a cyst on his left scrotal area that currently is measuring about a centimeter. He said it was measuring about 3 cm a few days ago so is been going down in size significantly. Patient without any other significant complaints. Seems to be nontoxic no acute distress. Patients receiving dialysis for the past 6 months.  Patient will be treated with prednisone and hydrocodone for the tooth pain. An antibody choice was doxycycline to try to cover both tooth and the scrotal area. Patient will return if the cyst on scrotum starts to enlarge in size.    Fredia Sorrow, MD 11/11/14 1728

## 2014-11-11 NOTE — Discharge Instructions (Signed)
Take the antibody take as directed take pain medicine as directed. Take prednisone as directed return if the scrotal cyst starts to increase in size. Based on your description is been getting significant smaller over the last several days. Continue her dialysis schedule.

## 2014-11-16 ENCOUNTER — Encounter (HOSPITAL_COMMUNITY): Payer: Self-pay

## 2014-11-16 ENCOUNTER — Observation Stay (HOSPITAL_COMMUNITY)
Admission: EM | Admit: 2014-11-16 | Discharge: 2014-11-17 | Disposition: A | Discharge status: Home / Self Care | Payer: Medicare Other | Attending: Internal Medicine | Admitting: Internal Medicine

## 2014-11-16 ENCOUNTER — Ambulatory Visit (HOSPITAL_COMMUNITY): Payer: Medicare Other | Attending: Nurse Practitioner | Admitting: Physical Therapy

## 2014-11-16 DIAGNOSIS — K5900 Constipation, unspecified: Secondary | ICD-10-CM | POA: Insufficient documentation

## 2014-11-16 DIAGNOSIS — R112 Nausea with vomiting, unspecified: Principal | ICD-10-CM | POA: Insufficient documentation

## 2014-11-16 DIAGNOSIS — M109 Gout, unspecified: Secondary | ICD-10-CM | POA: Diagnosis not present

## 2014-11-16 DIAGNOSIS — Z872 Personal history of diseases of the skin and subcutaneous tissue: Secondary | ICD-10-CM | POA: Diagnosis not present

## 2014-11-16 DIAGNOSIS — R61 Generalized hyperhidrosis: Secondary | ICD-10-CM | POA: Diagnosis not present

## 2014-11-16 DIAGNOSIS — N184 Chronic kidney disease, stage 4 (severe): Secondary | ICD-10-CM | POA: Diagnosis not present

## 2014-11-16 DIAGNOSIS — Z79899 Other long term (current) drug therapy: Secondary | ICD-10-CM | POA: Insufficient documentation

## 2014-11-16 DIAGNOSIS — G8929 Other chronic pain: Secondary | ICD-10-CM | POA: Insufficient documentation

## 2014-11-16 DIAGNOSIS — D649 Anemia, unspecified: Secondary | ICD-10-CM | POA: Diagnosis not present

## 2014-11-16 DIAGNOSIS — E1140 Type 2 diabetes mellitus with diabetic neuropathy, unspecified: Secondary | ICD-10-CM | POA: Insufficient documentation

## 2014-11-16 DIAGNOSIS — Z86718 Personal history of other venous thrombosis and embolism: Secondary | ICD-10-CM | POA: Insufficient documentation

## 2014-11-16 DIAGNOSIS — I129 Hypertensive chronic kidney disease with stage 1 through stage 4 chronic kidney disease, or unspecified chronic kidney disease: Secondary | ICD-10-CM | POA: Diagnosis not present

## 2014-11-16 DIAGNOSIS — N19 Unspecified kidney failure: Secondary | ICD-10-CM | POA: Diagnosis present

## 2014-11-16 DIAGNOSIS — N400 Enlarged prostate without lower urinary tract symptoms: Secondary | ICD-10-CM | POA: Diagnosis not present

## 2014-11-16 DIAGNOSIS — R109 Unspecified abdominal pain: Secondary | ICD-10-CM | POA: Diagnosis not present

## 2014-11-16 DIAGNOSIS — E875 Hyperkalemia: Secondary | ICD-10-CM | POA: Diagnosis present

## 2014-11-16 DIAGNOSIS — I10 Essential (primary) hypertension: Secondary | ICD-10-CM | POA: Diagnosis present

## 2014-11-16 DIAGNOSIS — I4510 Unspecified right bundle-branch block: Secondary | ICD-10-CM | POA: Diagnosis present

## 2014-11-16 MED ORDER — HYDROCODONE-ACETAMINOPHEN 5-325 MG PO TABS
2.0000 | ORAL_TABLET | Freq: Once | ORAL | Status: AC
  Administered 2014-11-16: 2 via ORAL
  Filled 2014-11-16: qty 2

## 2014-11-16 MED ORDER — ONDANSETRON HCL 4 MG/2ML IJ SOLN
4.0000 mg | Freq: Once | INTRAMUSCULAR | Status: AC
  Administered 2014-11-16: 4 mg via INTRAVENOUS
  Filled 2014-11-16: qty 2

## 2014-11-16 NOTE — ED Notes (Signed)
Patient states he is on Dialysis M,W,F-states he missed yesterday, and plans to go in the morning at regular scheduled time.

## 2014-11-16 NOTE — ED Notes (Signed)
Patient states he has had emesis X15 minutes.

## 2014-11-16 NOTE — ED Provider Notes (Signed)
CSN: [id number]     Arrival date & time 11/16/14  2218 History  By signing my name below, I, Helane Gunther, attest that this documentation has been prepared under the direction and in the presence of No att. providers found. Electronically Signed: Helane Gunther, ED Scribe. 11/17/2014. 9:26 PM.    Chief Complaint  Patient presents with  . Emesis   The history is provided by the patient. No language interpreter was used.   HPI Comments: Jack Irwin is a 49 y.o. male with a PMHx of HTN, renal insufficiency, and gout who presents to the Emergency Department complaining of emesis (6x) onset earlier today. He reports his last episode occurred 15 minutes ago. He notes he only ate a chicken sandwich today. He reports associated nausea, diaphoresis, hot flashes, and diffuse abdominal pain. Pt states he missed his dialysis appointment yesterday. He states he is scheduled for dialysis tomorrow. Pt denies recent travel or sick contacts.    Past Medical History  Diagnosis Date  . Hypertension   . Diabetes mellitus   . Gout   . Chronic back pain   . BPH (benign prostatic hyperplasia)   . Anemia   . DVT (deep venous thrombosis) ([place])   . Neuropathy ([place])   . Decubitus ulcer     of 2nd toes of both feet.  . Edema leg   . Constipation   . Venous (peripheral) insufficiency   . Neuropathy, diabetic ([place])   . Physical deconditioning   . Poor balance   . Difficulty walking   . Lack of coordination   . Arthritis   . Renal insufficiency   . Chronic kidney disease   . Chronic kidney disease (CKD), stage IV (severe) ([place])   . Constipation    Past Surgical History  Procedure Laterality Date  . None    . Insertion of dialysis catheter    . Cyst excision Right     cyst removed on thumb 2001  . Insertion of dialysis catheter Left 03/09/2014    Procedure: INSERTION OF DIALYSIS CATHETER;  Surgeon: Angelia Mould, MD;  Location: [facility];  Service: Vascular;  Laterality: Left;  . Av  fistula placement Left 03/09/2014    Procedure: INSERTION OF ARTERIOVENOUS (AV) GORE-TEX GRAFT ARM;  Surgeon: Angelia Mould, MD;  Location: [facility] OR;  Service: Vascular;  Laterality: Left;   Family History  Problem Relation Age of Onset  . Diabetes Mother   . Hypertension Mother   . Heart failure Mother   . Hyperlipidemia Mother   . Heart attack Mother   . Cancer Father   . Diabetes Father   . Hypertension Father   . Hyperlipidemia Father    Social History  Substance Use Topics  . Smoking status: Never Smoker   . Smokeless tobacco: Never Used  . Alcohol Use: No    Review of Systems  Constitutional: Positive for diaphoresis.  Gastrointestinal: Positive for nausea, vomiting and abdominal pain.    Allergies  Review of patient's allergies indicates no known allergies.  Home Medications   Prior to Admission medications   Medication Sig Start Date End Date Taking? Authorizing Provider  calcium acetate (PHOSLO) 667 MG capsule Take 1 capsule by mouth 3 (three) times daily. 04/11/14  Yes Historical Provider, MD  diclofenac sodium (VOLTAREN) 1 % GEL Apply to left wrist and hand three times daily 04/13/14  Yes Lily Kocher, PA-C  doxycycline (VIBRAMYCIN) 100 MG capsule Take 1 capsule (100 mg total) by mouth 2 (two)  times daily. 11/11/14  Yes Fredia Sorrow, MD  gabapentin (NEURONTIN) 100 MG capsule Take 100 mg by mouth daily.  07/11/14  Yes Historical Provider, MD  HYDROcodone-acetaminophen (NORCO/VICODIN) 5-325 MG tablet Take 1-2 tablets by mouth 2 (two) times daily as needed for moderate pain. 11/11/14  Yes Fredia Sorrow, MD  predniSONE (DELTASONE) 10 MG tablet Take 4 tablets (40 mg total) by mouth daily. 11/11/14  Yes Fredia Sorrow, MD  RENVELA 800 MG tablet Take 800 mg by mouth 3 (three) times daily with meals.  07/18/14  Yes Historical Provider, MD  sildenafil (VIAGRA) 50 MG tablet Take 50 mg by mouth daily as needed for erectile dysfunction.   Yes Historical Provider, MD  ULORIC  80 MG TABS Take 80 mg by mouth daily. 09/26/14  Yes Historical Provider, MD  clotrimazole (LOTRIMIN) 1 % cream Apply 1 application topically daily as needed. rash 10/03/14   Historical Provider, MD  colchicine 0.6 MG tablet Take 0.5 tablets (0.3 mg total) by mouth 2 (two) times a week. Take 0.6mg  (one tablet) by mouth every 1-2 hours until one of the following occurs: 1.  The pain is gone 2.  The maximum dose has been given ( no more than 3 tabs in 3 hours or 10 tabs in 24 hours) 3.  The side effects outweight the benefits 04/12/14   Noemi Chapel, MD   BP 144/79 mmHg  Pulse 61  Temp(Src) 98.2 F (36.8 C) (Oral)  Resp 16  Ht 5\' 11"  (1.803 m)  Wt 254 lb 3.1 oz (115.3 kg)  BMI 35.47 kg/m2  SpO2 99% Physical Exam  Constitutional: He is oriented to person, place, and time. He appears well-developed and well-nourished.  HENT:  Head: Normocephalic.  Mouth/Throat: Oropharynx is clear and moist.  Eyes: EOM are normal.  Neck: Normal range of motion.  Cardiovascular: Normal rate, regular rhythm and normal heart sounds.   Pulmonary/Chest: Effort normal.  Abdominal: Soft. He exhibits no distension. There is no tenderness.  Hyperactive bowel sounds  Musculoskeletal: Normal range of motion.  Neurological: He is alert and oriented to person, place, and time.  Psychiatric: He has a normal mood and affect.  Nursing note and vitals reviewed.   ED Course  Procedures  DIAGNOSTIC STUDIES: Oxygen Saturation is 100% on RA, normal by my interpretation.    COORDINATION OF CARE: 11:40 PM - Discussed possible gastroenteritis. Discussed plans to order diagnostic studies and anti-nausea medication. Pt advised of plan for treatment and pt agrees.  Labs Review Labs Reviewed  COMPREHENSIVE METABOLIC PANEL - Abnormal; Notable for the following:    Potassium 5.2 (*)    Chloride 99 (*)    CO2 21 (*)    Glucose, Bld 108 (*)    BUN 121 (*)    Creatinine, Ser 13.96 (*)    Calcium 8.1 (*)    GFR calc non Af  Amer 4 (*)    GFR calc Af Amer 4 (*)    All other components within normal limits  CBC - Abnormal; Notable for the following:    WBC 14.9 (*)    RBC 4.09 (*)    Hemoglobin 12.1 (*)    HCT 36.6 (*)    RDW 17.1 (*)    Platelets 116 (*)    All other components within normal limits  CREATININE, SERUM - Abnormal; Notable for the following:    Creatinine, Ser 14.05 (*)    GFR calc non Af Amer 4 (*)    GFR calc Af Amer 4 (*)  All other components within normal limits  LIPASE, BLOOD  HEPATITIS B SURFACE ANTIGEN  CBC    Imaging Review No results found. I have personally reviewed and evaluated these images and lab results as part of my medical decision-making.   EKG Interpretation None      MDM   Final diagnoses:  Non-intractable vomiting with nausea, vomiting of unspecified type  Uremia syndrome   49 year old male with a seizure disease here with multiple episodes of vomiting prior to arrival. Exam is consistent with slight dehydration. Labs with slight hyperkalemia possibly peaked T waves on his EKG but not all the leads. His BUN was >110. 2/2 to the uremia with  Nausea and vomiting earlier today I spoke with medicine about management who consulted and felt obs admission for dialysis 2/2 uremia was warranted. Patient stable upon admission to the floor.   I personally performed the services described in this documentation, which was scribed in my presence. The recorded information has been reviewed and is accurate.   Merrily Pew, MD 11/17/14 2126

## 2014-11-17 ENCOUNTER — Encounter (HOSPITAL_COMMUNITY): Payer: Self-pay | Admitting: Internal Medicine

## 2014-11-17 ENCOUNTER — Other Ambulatory Visit: Payer: Self-pay

## 2014-11-17 DIAGNOSIS — R112 Nausea with vomiting, unspecified: Secondary | ICD-10-CM | POA: Diagnosis present

## 2014-11-17 DIAGNOSIS — I4510 Unspecified right bundle-branch block: Secondary | ICD-10-CM | POA: Diagnosis not present

## 2014-11-17 DIAGNOSIS — N19 Unspecified kidney failure: Secondary | ICD-10-CM | POA: Diagnosis not present

## 2014-11-17 DIAGNOSIS — I10 Essential (primary) hypertension: Secondary | ICD-10-CM

## 2014-11-17 LAB — COMPREHENSIVE METABOLIC PANEL
ALK PHOS: 50 U/L (ref 38–126)
ALT: 22 U/L (ref 17–63)
ANION GAP: 15 (ref 5–15)
AST: 22 U/L (ref 15–41)
Albumin: 3.6 g/dL (ref 3.5–5.0)
BUN: 121 mg/dL — ABNORMAL HIGH (ref 6–20)
CALCIUM: 8.1 mg/dL — AB (ref 8.9–10.3)
CO2: 21 mmol/L — ABNORMAL LOW (ref 22–32)
CREATININE: 13.96 mg/dL — AB (ref 0.61–1.24)
Chloride: 99 mmol/L — ABNORMAL LOW (ref 101–111)
GFR, EST AFRICAN AMERICAN: 4 mL/min — AB (ref >60)
GFR, EST NON AFRICAN AMERICAN: 4 mL/min — AB (ref >60)
Glucose, Bld: 108 mg/dL — ABNORMAL HIGH (ref 65–99)
Potassium: 5.2 mmol/L — ABNORMAL HIGH (ref 3.5–5.1)
Sodium: 135 mmol/L (ref 135–145)
TOTAL PROTEIN: 6.9 g/dL (ref 6.5–8.1)
Total Bilirubin: 0.9 mg/dL (ref 0.3–1.2)

## 2014-11-17 LAB — CREATININE, SERUM
CREATININE: 14.05 mg/dL — AB (ref 0.61–1.24)
GFR, EST AFRICAN AMERICAN: 4 mL/min — AB (ref >60)
GFR, EST NON AFRICAN AMERICAN: 4 mL/min — AB (ref >60)

## 2014-11-17 LAB — CBC
HCT: 36.6 % — ABNORMAL LOW (ref 39.0–52.0)
Hemoglobin: 12.1 g/dL — ABNORMAL LOW (ref 13.0–17.0)
MCH: 29.6 pg (ref 26.0–34.0)
MCHC: 33.1 g/dL (ref 30.0–36.0)
MCV: 89.5 fL (ref 78.0–100.0)
PLATELETS: 116 10*3/uL — AB (ref 150–400)
RBC: 4.09 MIL/uL — ABNORMAL LOW (ref 4.22–5.81)
RDW: 17.1 % — AB (ref 11.5–15.5)
WBC: 14.9 10*3/uL — AB (ref 4.0–10.5)

## 2014-11-17 LAB — LIPASE, BLOOD: LIPASE: 41 U/L (ref 22–51)

## 2014-11-17 MED ORDER — LIDOCAINE-PRILOCAINE 2.5-2.5 % EX CREA: 1.0000 "application " | TOPICAL_CREAM | CUTANEOUS | Status: DC | PRN

## 2014-11-17 MED ORDER — SODIUM CHLORIDE 0.9 % IV SOLN: 100.0000 mL | INTRAVENOUS | Status: DC | PRN

## 2014-11-17 MED ORDER — HEPARIN SODIUM (PORCINE) 5000 UNIT/ML IJ SOLN
5000.0000 [IU] | Freq: Three times a day (TID) | INTRAMUSCULAR | Status: DC
  Administered 2014-11-17: 5000 [IU] via SUBCUTANEOUS
  Filled 2014-11-17: qty 1

## 2014-11-17 MED ORDER — ALTEPLASE 2 MG IJ SOLR
2.0000 mg | Freq: Once | INTRAMUSCULAR | Status: DC | PRN
  Filled 2014-11-17: qty 2

## 2014-11-17 MED ORDER — HEPARIN SODIUM (PORCINE) 1000 UNIT/ML DIALYSIS
1000.0000 [IU] | INTRAMUSCULAR | Status: DC | PRN
  Filled 2014-11-17: qty 1

## 2014-11-17 MED ORDER — SODIUM CHLORIDE 0.9 % IJ SOLN
3.0000 mL | Freq: Two times a day (BID) | INTRAMUSCULAR | Status: DC
  Administered 2014-11-17: 3 mL via INTRAVENOUS

## 2014-11-17 MED ORDER — HEPARIN SODIUM (PORCINE) 1000 UNIT/ML IJ SOLN
INTRAMUSCULAR | Status: AC
  Administered 2014-11-17: 2400 [IU] via INTRAVENOUS_CENTRAL
  Filled 2014-11-17: qty 3

## 2014-11-17 MED ORDER — ONDANSETRON HCL 4 MG PO TABS
4.0000 mg | ORAL_TABLET | Freq: Four times a day (QID) | ORAL | Status: DC | PRN
Start: 2014-11-17 — End: 2014-11-17
  Administered 2014-11-17: 4 mg via ORAL
  Filled 2014-11-17: qty 1

## 2014-11-17 MED ORDER — LIDOCAINE HCL (PF) 1 % IJ SOLN: 5.0000 mL | INTRAMUSCULAR | Status: DC | PRN

## 2014-11-17 MED ORDER — HEPARIN SODIUM (PORCINE) 1000 UNIT/ML DIALYSIS
20.0000 [IU]/kg | INTRAMUSCULAR | Status: DC | PRN
  Administered 2014-11-17: 2400 [IU] via INTRAVENOUS_CENTRAL
  Filled 2014-11-17 (×2): qty 3

## 2014-11-17 MED ORDER — ONDANSETRON HCL 4 MG/2ML IJ SOLN
4.0000 mg | Freq: Four times a day (QID) | INTRAMUSCULAR | Status: DC | PRN
  Administered 2014-11-17: 4 mg via INTRAVENOUS
  Filled 2014-11-17: qty 2

## 2014-11-17 MED ORDER — GABAPENTIN 100 MG PO CAPS
100.0000 mg | ORAL_CAPSULE | Freq: Every day | ORAL | Status: DC
  Administered 2014-11-17: 100 mg via ORAL
  Filled 2014-11-17: qty 1

## 2014-11-17 MED ORDER — PENTAFLUOROPROP-TETRAFLUOROETH EX AERO: 1.0000 "application " | INHALATION_SPRAY | CUTANEOUS | Status: DC | PRN

## 2014-11-17 MED ORDER — SEVELAMER CARBONATE 800 MG PO TABS
800.0000 mg | ORAL_TABLET | Freq: Three times a day (TID) | ORAL | Status: DC
  Administered 2014-11-17 (×2): 800 mg via ORAL
  Filled 2014-11-17 (×2): qty 1

## 2014-11-17 NOTE — Procedures (Signed)
   HEMODIALYSIS TREATMENT NOTE:  4.5 hour low-heparin dialysis completed via left upper arm AVF (15g ante/retrogradre). Goal NOT met: Ultrafiltration was interrupted x 30 minutes at pt's request in an effort to relieve cramping. Hemodynamically stable. Net UF 3L (goal 3.5L). All blood was reinfused and hemostasis was achieved within 15 minutes. Report called to Charlynn Court, RN.  Jorene Kaylor L. Brazen Domangue, RN, CDN

## 2014-11-17 NOTE — Discharge Summary (Signed)
Physician Discharge Summary  NIKE NEWHART [id number] DOB: October 14, 1965 DOA: 11/16/2014  PCP: No PCP Per Patient  Admit date: 11/16/2014 Discharge date: 11/17/2014  Time spent: 20 minutes  Recommendations for Outpatient Follow-up:  1. Follow up with PCP in 1-2 weeks 2. Follow up with scheduled HD  Discharge Diagnoses:  Principal Problem:   Uremia syndrome Active Problems:   Hyperkalemia   Hypertension   RBBB   Morbid obesity (HCC)   Nausea & vomiting   Uremia   Discharge Condition: Improved  Diet recommendation: Renal  Filed Weights   11/17/14 0527 11/17/14 1045  Weight: 118.343 kg (260 lb 14.4 oz) 118.2 kg (260 lb 9.3 oz)    History of present illness:  Please review h and p from 10/14. Briefly, pt is a 49yo who presented with nausea and vomiting after missing recent HD session. Patient was admitted for concerns for uremia  Hospital Course:  The patient was admitted to the floor. Nephrology was consulted and the patient underwent HD on 10/14. Patient was seen in the AM eating well and tolerating diet without any difficulty. Case was discussed with Dr. Lowanda Foster who states pt is stable for d/c after completing HD session. Patient remained medically stable for close outpatient follow up.  Procedures:  Hemodialysis 10/14  Consultations:  Nephrology  Discharge Exam: Filed Vitals:   11/17/14 1230 11/17/14 1300 11/17/14 1330 11/17/14 1400  BP: 116/64 136/74 130/74 130/80  Pulse: 58 70 60 59  Temp:      TempSrc:      Resp:      Height:      Weight:      SpO2:        General: awake, in nad Cardiovascular: regular, s1, s2 Respiratory: normal resp effort, no wheezing  Discharge Instructions     Medication List    TAKE these medications        calcium acetate 667 MG capsule  Commonly known as:  PHOSLO  Take 1 capsule by mouth 3 (three) times daily.     clotrimazole 1 % cream  Commonly known as:  LOTRIMIN  Apply 1 application topically daily as  needed. rash     colchicine 0.6 MG tablet  Take 0.5 tablets (0.3 mg total) by mouth 2 (two) times a week. Take 0.6mg  (one tablet) by mouth every 1-2 hours until one of the following occurs: 1.  The pain is gone 2.  The maximum dose has been given ( no more than 3 tabs in 3 hours or 10 tabs in 24 hours) 3.  The side effects outweight the benefits     diclofenac sodium 1 % Gel  Commonly known as:  VOLTAREN  Apply to left wrist and hand three times daily     doxycycline 100 MG capsule  Commonly known as:  VIBRAMYCIN  Take 1 capsule (100 mg total) by mouth 2 (two) times daily.     gabapentin 100 MG capsule  Commonly known as:  NEURONTIN  Take 100 mg by mouth daily.     HYDROcodone-acetaminophen 5-325 MG tablet  Commonly known as:  NORCO/VICODIN  Take 1-2 tablets by mouth 2 (two) times daily as needed for moderate pain.     predniSONE 10 MG tablet  Commonly known as:  DELTASONE  Take 4 tablets (40 mg total) by mouth daily.     RENVELA 800 MG tablet  Generic drug:  sevelamer carbonate  Take 800 mg by mouth 3 (three) times daily with meals.  sildenafil 50 MG tablet  Commonly known as:  VIAGRA  Take 50 mg by mouth daily as needed for erectile dysfunction.     ULORIC 80 MG Tabs  Generic drug:  Febuxostat  Take 80 mg by mouth daily.       No Known Allergies Follow-up Information    Schedule an appointment as soon as possible for a visit with Follow up with your PCP in 1-2 weeks.   Why:  Hospital follow up       The results of significant diagnostics from this hospitalization (including imaging, microbiology, ancillary and laboratory) are listed below for reference.    Significant Diagnostic Studies: No results found.  Microbiology: No results found for this or any previous visit (from the past 240 hour(s)).   Labs: Basic Metabolic Panel:  Recent Labs Lab 11/17/14 0008 11/17/14 0748  NA 135  --   K 5.2*  --   CL 99*  --   CO2 21*  --   GLUCOSE 108*  --   BUN  121*  --   CREATININE 13.96* 14.05*  CALCIUM 8.1*  --    Liver Function Tests:  Recent Labs Lab 11/17/14 0008  AST 22  ALT 22  ALKPHOS 50  BILITOT 0.9  PROT 6.9  ALBUMIN 3.6    Recent Labs Lab 11/17/14 0008  LIPASE 41   No results for input(s): AMMONIA in the last 168 hours. CBC:  Recent Labs Lab 11/17/14 0748  WBC 14.9*  HGB 12.1*  HCT 36.6*  MCV 89.5  PLT 116*   Cardiac Enzymes: No results for input(s): CKTOTAL, CKMB, CKMBINDEX, TROPONINI in the last 168 hours. BNP: BNP (last 3 results) No results for input(s): BNP in the last 8760 hours.  ProBNP (last 3 results) No results for input(s): PROBNP in the last 8760 hours.  CBG: No results for input(s): GLUCAP in the last 168 hours.   Signed:  Sally Reimers K  [facility] 11/17/2014, 2:56 PM

## 2014-11-17 NOTE — Discharge Planning (Addendum)
Pt IV to be removed.  DC papers, given, explained and educated.  Pt told of suggested FU appts. Pt will be walked to front and family to be transporting home via car.

## 2014-11-17 NOTE — Consult Note (Signed)
Reason for Consult: End-stage renal disease Referring Physician: Dr. Joneen Roach is an 49 y.o. male.  HPI: He is a patient with history of diabetes, DVT, hypertension, end-stage renal disease on maintenance hemodialysis presently came with complaints of nausea, vomiting and unable to keep anything down. Patient had his dialysis on Monday and missed Wednesday. When he was evaluated his BUN was very high hence thought to be secondary to uremia. Presently patient denies any difficulty in breathing. He has poor appetite. His nausea and vomiting is better this morning.  Past Medical History  Diagnosis Date  . Hypertension   . Diabetes mellitus   . Gout   . Chronic back pain   . BPH (benign prostatic hyperplasia)   . Anemia   . DVT (deep venous thrombosis) ([place])   . Neuropathy ([place])   . Decubitus ulcer     of 2nd toes of both feet.  . Edema leg   . Constipation   . Venous (peripheral) insufficiency   . Neuropathy, diabetic ([place])   . Physical deconditioning   . Poor balance   . Difficulty walking   . Lack of coordination   . Arthritis   . Renal insufficiency   . Chronic kidney disease   . Chronic kidney disease (CKD), stage IV (severe) ([place])   . Constipation     Past Surgical History  Procedure Laterality Date  . None    . Insertion of dialysis catheter    . Cyst excision Right     cyst removed on thumb 2001  . Insertion of dialysis catheter Left 03/09/2014    Procedure: INSERTION OF DIALYSIS CATHETER;  Surgeon: Angelia Mould, MD;  Location: [place];  Service: Vascular;  Laterality: Left;  . Av fistula placement Left 03/09/2014    Procedure: INSERTION OF ARTERIOVENOUS (AV) GORE-TEX GRAFT ARM;  Surgeon: Angelia Mould, MD;  Location: [facility] OR;  Service: Vascular;  Laterality: Left;    Family History  Problem Relation Age of Onset  . Diabetes Mother   . Hypertension Mother   . Heart failure Mother   . Hyperlipidemia Mother   . Heart attack Mother   .  Cancer Father   . Diabetes Father   . Hypertension Father   . Hyperlipidemia Father     Social History:  reports that he has never smoked. He has never used smokeless tobacco. He reports that he does not drink alcohol or use illicit drugs.  Allergies: No Known Allergies  Medications: I have reviewed the patient's current medications.  Results for orders placed or performed during the hospital encounter of 11/16/14 (from the past 48 hour(s))  Comprehensive metabolic panel     Status: Abnormal   Collection Time: 11/17/14 12:08 AM  Result Value Ref Range   Sodium 135 135 - 145 mmol/L   Potassium 5.2 (H) 3.5 - 5.1 mmol/L   Chloride 99 (L) 101 - 111 mmol/L   CO2 21 (L) 22 - 32 mmol/L   Glucose, Bld 108 (H) 65 - 99 mg/dL   BUN 121 (H) 6 - 20 mg/dL    Comment: RESULTS CONFIRMED BY MANUAL DILUTION   Creatinine, Ser 13.96 (H) 0.61 - 1.24 mg/dL   Calcium 8.1 (L) 8.9 - 10.3 mg/dL   Total Protein 6.9 6.5 - 8.1 g/dL   Albumin 3.6 3.5 - 5.0 g/dL   AST 22 15 - 41 U/L   ALT 22 17 - 63 U/L   Alkaline Phosphatase 50 38 - 126 U/L  Total Bilirubin 0.9 0.3 - 1.2 mg/dL   GFR calc non Af Amer 4 (L) >60 mL/min   GFR calc Af Amer 4 (L) >60 mL/min    Comment: (NOTE) The eGFR has been calculated using the CKD EPI equation. This calculation has not been validated in all clinical situations. eGFR's persistently <60 mL/min signify possible Chronic Kidney Disease.    Anion gap 15 5 - 15  Lipase, blood     Status: None   Collection Time: 11/17/14 12:08 AM  Result Value Ref Range   Lipase 41 22 - 51 U/L  CBC     Status: Abnormal   Collection Time: 11/17/14  7:48 AM  Result Value Ref Range   WBC 14.9 (H) 4.0 - 10.5 K/uL   RBC 4.09 (L) 4.22 - 5.81 MIL/uL   Hemoglobin 12.1 (L) 13.0 - 17.0 g/dL   HCT 36.6 (L) 39.0 - 52.0 %   MCV 89.5 78.0 - 100.0 fL   MCH 29.6 26.0 - 34.0 pg   MCHC 33.1 30.0 - 36.0 g/dL   RDW 17.1 (H) 11.5 - 15.5 %   Platelets 116 (L) 150 - 400 K/uL    Comment: SPECIMEN CHECKED  FOR CLOTS PLATELET COUNT CONFIRMED BY SMEAR   Creatinine, serum     Status: Abnormal   Collection Time: 11/17/14  7:48 AM  Result Value Ref Range   Creatinine, Ser 14.05 (H) 0.61 - 1.24 mg/dL   GFR calc non Af Amer 4 (L) >60 mL/min   GFR calc Af Amer 4 (L) >60 mL/min    Comment: (NOTE) The eGFR has been calculated using the CKD EPI equation. This calculation has not been validated in all clinical situations. eGFR's persistently <60 mL/min signify possible Chronic Kidney Disease.     No results found.  Review of Systems  Constitutional: Positive for malaise/fatigue. Negative for fever and chills.  Respiratory: Negative for shortness of breath.   Cardiovascular: Negative for orthopnea.  Gastrointestinal: Positive for nausea and vomiting. Negative for abdominal pain and diarrhea.   Blood pressure 119/85, pulse 78, temperature 98.5 F (36.9 C), temperature source Oral, resp. rate 15, height 5' 11"  (1.803 m), weight 260 lb 14.4 oz (118.343 kg), SpO2 98 %. Physical Exam  Constitutional: He is oriented to person, place, and time. No distress.  Eyes: No scleral icterus.  Neck: No JVD present.  Cardiovascular: Normal rate and regular rhythm.  Exam reveals gallop.   Respiratory: He has no wheezes. He has no rales.  GI: He exhibits no distension. There is no tenderness. There is no rebound.  Musculoskeletal: He exhibits edema.  Neurological: He is alert and oriented to person, place, and time.    Assessment/Plan: Problem #1 nausea and vomiting: Possibly related to his high BUN. Problem #2 end-stage renal disease: His last dialysis was on Monday. As stated above his BUN is 121 with creatinine of 14. His potassium is 5.2. Problem #3 morbid obesity Problem #4 hypertension: His blood pressure is reasonably controlled. Occasionally patient had episode of hypotension on dialysis. Problem #5 hypertensive cardiomyopathy Problem #6 anemia: His hemoglobin is above our target goal Problem #7  metabolic bone disease: His calcium is range Problem #8 fluid management: Patient without significant side of fluid overload. Plan: We'll make arrangements for patient to get dialysis today We'll hold Epogen We'll try to remove about 3-1/2 L with dialysis. We'll check his basic metabolic panel in the morning.  Vijay Durflinger S 11/17/2014, 8:28 AM

## 2014-11-17 NOTE — H&P (Signed)
[facility] History and Physical  SEBASHTIAN ZWEIG [id number] DOB: 1965/12/02    PCP:   No PCP Per Patient   Chief Complaint: nausea, vomiting and malaise.   HPI: Jack Irwin is an 49 y.o. male with hx of HTN, hypertensive and diabetic nephropathy and ESRD on HD ([facility], Dr [facility]), hx of gout, MD, obesity, DM and diabetic neuropathy, presented to the ER after missing his dialysis on Wednesday, having feeling malaise, nausea, vomiting with no diarrhea.  He said he was moving and wasn't able to make it to his dialysis session.  He has not been able to take anything orally.  Evaluation in the ER showed BUN of 121 with Cr of 13.4, K of 5.2.  EKG showed RBBB with QTc of 43mc, no symmetrical peaked Ts. His CBC was not performed, and his Lipase was normal.  He denied distant travel or ill contacts.  Hospitalist was asked to admit him for urgent dialysis.   Rewiew of Systems:  Constitutional: Negative for malaise, fever and chills. No significant weight loss or weight gain Eyes: Negative for eye pain, redness and discharge, diplopia, visual changes, or flashes of light. ENMT: Negative for ear pain, hoarseness, nasal congestion, sinus pressure and sore throat. No headaches; tinnitus, drooling, or problem swallowing. Cardiovascular: Negative for chest pain, palpitations, diaphoresis, dyspnea and peripheral edema. ; No orthopnea, PND Respiratory: Negative for cough, hemoptysis, wheezing and stridor. No pleuritic chestpain. Gastrointestinal: Negative for diarrhea, constipation, abdominal pain, melena, blood in stool, hematemesis, jaundice and rectal bleeding.    Genitourinary: Negative for frequency, dysuria, incontinence,flank pain and hematuria; Musculoskeletal: Negative for back pain and neck pain. Negative for swelling and trauma.;  Skin: . Negative for pruritus, rash, abrasions, bruising and skin lesion.; ulcerations Neuro: Negative for headache, lightheadedness and neck  stiffness. Negative for weakness, altered level of consciousness , altered mental status, extremity  burning feet, involuntary movement, seizure and syncope.  Psych: negative for anxiety, depression, insomnia, tearfulness, panic attacks, hallucinations, paranoia, suicidal or homicidal ideation    Past Medical History  Diagnosis Date  . Hypertension   . Diabetes mellitus   . Gout   . Chronic back pain   . BPH (benign prostatic hyperplasia)   . Anemia   . DVT (deep venous thrombosis) ([place])   . Neuropathy ([place])   . Decubitus ulcer     of 2nd toes of both feet.  . Edema leg   . Constipation   . Venous (peripheral) insufficiency   . Neuropathy, diabetic ([place])   . Physical deconditioning   . Poor balance   . Difficulty walking   . Lack of coordination   . Arthritis   . Renal insufficiency   . Chronic kidney disease   . Chronic kidney disease (CKD), stage IV (severe) ([place])   . Constipation     Past Surgical History  Procedure Laterality Date  . None    . Insertion of dialysis catheter    . Cyst excision Right     cyst removed on thumb 2001  . Insertion of dialysis catheter Left 03/09/2014    Procedure: INSERTION OF DIALYSIS CATHETER;  Surgeon: Angelia Mould, MD;  Location: [place];  Service: Vascular;  Laterality: Left;  . Av fistula placement Left 03/09/2014    Procedure: INSERTION OF ARTERIOVENOUS (AV) GORE-TEX GRAFT ARM;  Surgeon: Angelia Mould, MD;  Location: [facility] OR;  Service: Vascular;  Laterality: Left;    Medications:  HOME MEDS: Prior to Admission medications  Medication Sig Start Date End Date Taking? Authorizing Provider  calcium acetate (PHOSLO) 667 MG capsule Take 1 capsule by mouth 3 (three) times daily. 04/11/14  Yes Historical Provider, MD  diclofenac sodium (VOLTAREN) 1 % GEL Apply to left wrist and hand three times daily 04/13/14  Yes Lily Kocher, PA-C  doxycycline (VIBRAMYCIN) 100 MG capsule Take 1 capsule (100 mg total) by mouth 2 (two) times  daily. 11/11/14  Yes Fredia Sorrow, MD  gabapentin (NEURONTIN) 100 MG capsule Take 100 mg by mouth daily.  07/11/14  Yes Historical Provider, MD  HYDROcodone-acetaminophen (NORCO/VICODIN) 5-325 MG tablet Take 1-2 tablets by mouth 2 (two) times daily as needed for moderate pain. 11/11/14  Yes Fredia Sorrow, MD  predniSONE (DELTASONE) 10 MG tablet Take 4 tablets (40 mg total) by mouth daily. 11/11/14  Yes Fredia Sorrow, MD  RENVELA 800 MG tablet Take 800 mg by mouth 3 (three) times daily with meals.  07/18/14  Yes Historical Provider, MD  sildenafil (VIAGRA) 50 MG tablet Take 50 mg by mouth daily as needed for erectile dysfunction.   Yes Historical Provider, MD  ULORIC 80 MG TABS Take 80 mg by mouth daily. 09/26/14  Yes Historical Provider, MD  clotrimazole (LOTRIMIN) 1 % cream Apply 1 application topically daily as needed. rash 10/03/14   Historical Provider, MD  colchicine 0.6 MG tablet Take 0.5 tablets (0.3 mg total) by mouth 2 (two) times a week. Take 0.6mg  (one tablet) by mouth every 1-2 hours until one of the following occurs: 1.  The pain is gone 2.  The maximum dose has been given ( no more than 3 tabs in 3 hours or 10 tabs in 24 hours) 3.  The side effects outweight the benefits 04/12/14   Noemi Chapel, MD  predniSONE (DELTASONE) 50 MG tablet One tablet PO daily 4 days Patient not taking: Reported on 11/16/2014 10/28/14   Ripley Fraise, MD     Allergies:  No Known Allergies  Social History:   reports that he has never smoked. He has never used smokeless tobacco. He reports that he does not drink alcohol or use illicit drugs.  Family History: Family History  Problem Relation Age of Onset  . Diabetes Mother   . Hypertension Mother   . Heart failure Mother   . Hyperlipidemia Mother   . Heart attack Mother   . Cancer Father   . Diabetes Father   . Hypertension Father   . Hyperlipidemia Father      Physical Exam: Filed Vitals:   11/16/14 2225 11/17/14 0207  BP: 177/106 142/93   Pulse: 90 73  Temp: 97.6 F (36.4 C)   TempSrc: Oral   Resp: 18 21  SpO2: 100% 98%   Blood pressure 142/93, pulse 73, temperature 97.6 F (36.4 C), temperature source Oral, resp. rate 21, SpO2 98 %.  GEN:  Pleasant patient lying in the stretcher in no acute distress; cooperative with exam. PSYCH:  alert and oriented x4; does not appear anxious or depressed; affect is appropriate. HEENT: Mucous membranes pink and anicteric; PERRLA; EOM intact; no cervical lymphadenopathy nor thyromegaly or carotid bruit; no JVD; There were no stridor. Neck is very supple. Breasts:: Not examined CHEST WALL: No tenderness CHEST: Normal respiration, clear to auscultation bilaterally.  HEART: Regular rate and rhythm.  There are no murmur, rub, or gallops.   BACK: No kyphosis or scoliosis; no CVA tenderness ABDOMEN: soft and non-tender; no masses, no organomegaly, normal abdominal bowel sounds; no pannus; no intertriginous candida. There  is no rebound and no distention. Rectal Exam: Not done EXTREMITIES: No bone or joint deformity; age-appropriate arthropathy of the hands and knees; no edema; no ulcerations.  There is no calf tenderness.  Fistula on left arm with thrill.  Genitalia: not examined PULSES: 2+ and symmetric SKIN: Normal hydration no rash or ulceration CNS: Cranial nerves 2-12 grossly intact no focal lateralizing neurologic deficit.  Speech is fluent; uvula elevated with phonation, facial symmetry and tongue midline. DTR are normal bilaterally, cerebella exam is intact, barbinski is negative and strengths are equaled bilaterally.  No sensory loss.  No asterexis.     Labs on Admission:  Basic Metabolic Panel:  Recent Labs Lab 11/17/14 0008  NA 135  K 5.2*  CL 99*  CO2 21*  GLUCOSE 108*  BUN 121*  CREATININE 13.96*  CALCIUM 8.1*   Liver Function Tests:  Recent Labs Lab 11/17/14 0008  AST 22  ALT 22  ALKPHOS 50  BILITOT 0.9  PROT 6.9  ALBUMIN 3.6    Recent Labs Lab  11/17/14 0008  LIPASE 41   EKG: Independently reviewed.    Assessment/Plan Present on Admission:  . Nausea & vomiting . Hyperkalemia . Hypertension . Morbid obesity ([place]) . RBBB . Uremia  PLAN:  I think he has some uremia, with feeling malaise, nausea, vomiting, and no appetite, though no asterixis found on exam.  Will admit him and consult nephrology for urgent dialysis today.   His Hyperkalemia is mild, and EKG was not particularly impressive.  We will continue with his home meds.  Will admit him to [facility] service.  He is a full code.   Other plans as per orders.  Code Status: FULL CODE>    Orvan Falconer, MD. [facility] Pager [phone] 7pm to 7am.  11/17/2014, 2:53 AM

## 2014-11-18 LAB — HEPATITIS B SURFACE ANTIGEN: HEP B S AG: NEGATIVE

## 2014-12-12 IMAGING — CR DG FOOT COMPLETE 3+V*R*
1 series · 3 of 3 positions shown · non-contrast
Comparison: DG KNEE 1-2V BILAT dated 05/07/2012; DG FOOT COMPLETE*R*
dated 03/23/2012

CLINICAL DATA: Right great toe pain.  Dorsal foot pain.

EXAM:
RIGHT FOOT COMPLETE - 3+ VIEW

[Series 1: ap · 0.17mm/px · 3 of 3 slices shown]
[im 1/3]
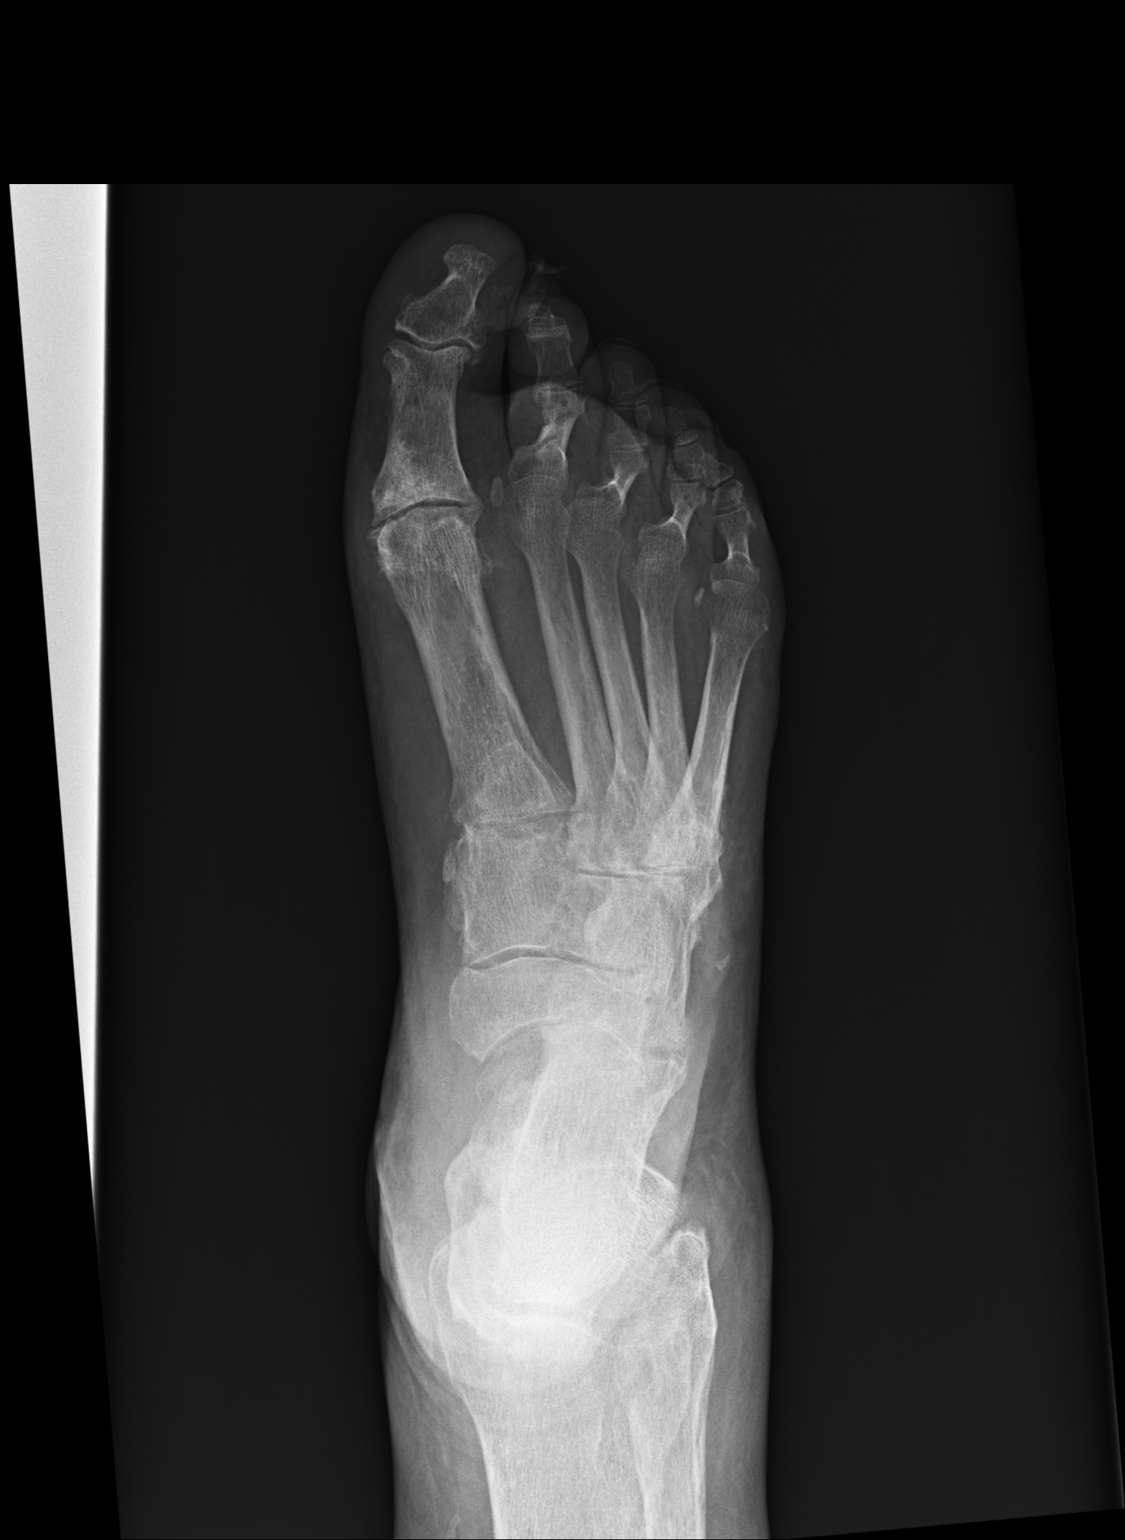
[im 2/3]
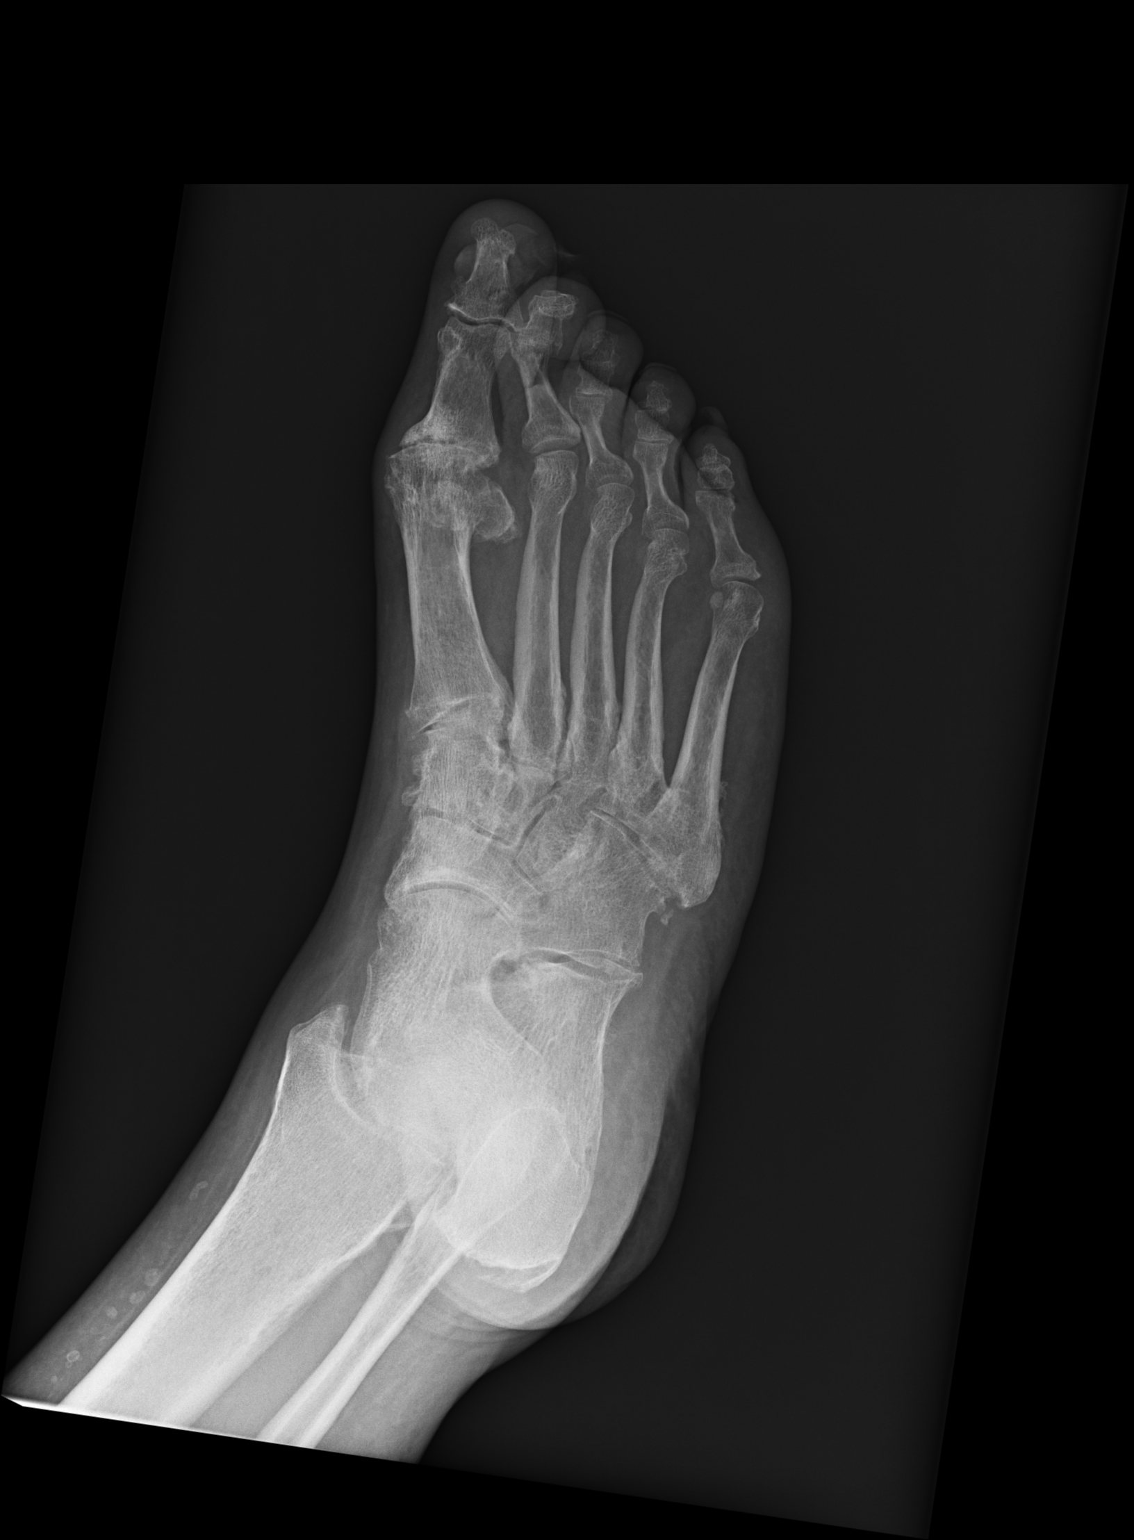
[im 3/3]
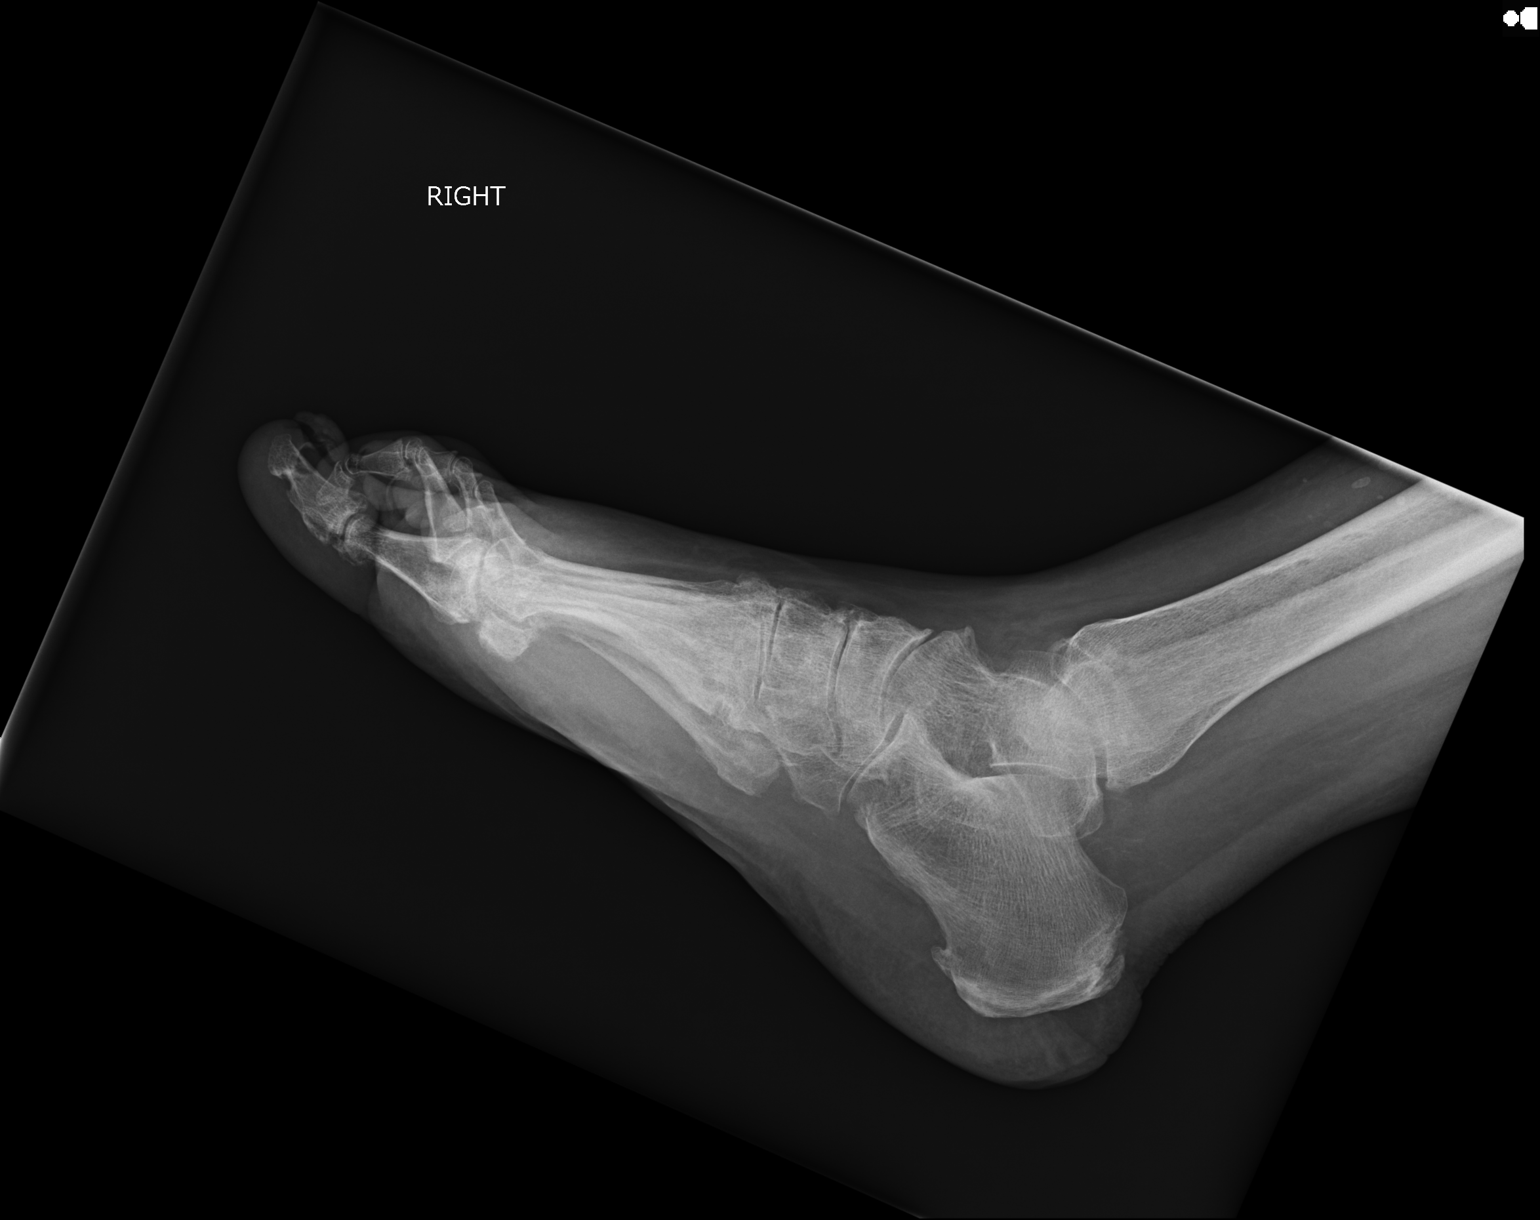

[3 of 3 positions shown; findings below may reference images not displayed]

FINDINGS: Severe first MTP joint osteoarthritis is present subchondral
sclerosis on both sides of the joint. Flexion of the second toe
appears to account for the unusual appearance on the frontal view.
Midfoot degenerative changes are present. Forefoot soft tissue
swelling. There appear to be cystic changes in the cuneiform bones,
raising the possibility of gout arthritis.
IMPRESSION: Degenerative changes of the foot, most pronounced at the first MTP
joint. No acute osseous abnormality. Distribution of disease suggest
gout arthritis.

## 2014-12-20 ENCOUNTER — Encounter (HOSPITAL_COMMUNITY): Payer: Self-pay | Admitting: Emergency Medicine

## 2014-12-20 ENCOUNTER — Emergency Department (HOSPITAL_COMMUNITY)
Admission: EM | Admit: 2014-12-20 | Discharge: 2014-12-20 | Disposition: A | Discharge status: Home / Self Care | Payer: Medicare Other | Attending: Emergency Medicine | Admitting: Emergency Medicine

## 2014-12-20 ENCOUNTER — Emergency Department (HOSPITAL_COMMUNITY): Payer: Medicare Other

## 2014-12-20 DIAGNOSIS — Z792 Long term (current) use of antibiotics: Secondary | ICD-10-CM | POA: Diagnosis not present

## 2014-12-20 DIAGNOSIS — N186 End stage renal disease: Secondary | ICD-10-CM

## 2014-12-20 DIAGNOSIS — G8929 Other chronic pain: Secondary | ICD-10-CM | POA: Insufficient documentation

## 2014-12-20 DIAGNOSIS — M1990 Unspecified osteoarthritis, unspecified site: Secondary | ICD-10-CM | POA: Insufficient documentation

## 2014-12-20 DIAGNOSIS — R079 Chest pain, unspecified: Secondary | ICD-10-CM | POA: Diagnosis not present

## 2014-12-20 DIAGNOSIS — Z79899 Other long term (current) drug therapy: Secondary | ICD-10-CM | POA: Diagnosis not present

## 2014-12-20 DIAGNOSIS — Z992 Dependence on renal dialysis: Secondary | ICD-10-CM | POA: Diagnosis not present

## 2014-12-20 DIAGNOSIS — Z7952 Long term (current) use of systemic steroids: Secondary | ICD-10-CM | POA: Diagnosis not present

## 2014-12-20 DIAGNOSIS — Z86718 Personal history of other venous thrombosis and embolism: Secondary | ICD-10-CM | POA: Diagnosis not present

## 2014-12-20 DIAGNOSIS — E1140 Type 2 diabetes mellitus with diabetic neuropathy, unspecified: Secondary | ICD-10-CM | POA: Diagnosis not present

## 2014-12-20 DIAGNOSIS — E119 Type 2 diabetes mellitus without complications: Secondary | ICD-10-CM | POA: Diagnosis not present

## 2014-12-20 DIAGNOSIS — Z862 Personal history of diseases of the blood and blood-forming organs and certain disorders involving the immune mechanism: Secondary | ICD-10-CM | POA: Insufficient documentation

## 2014-12-20 DIAGNOSIS — Z791 Long term (current) use of non-steroidal anti-inflammatories (NSAID): Secondary | ICD-10-CM | POA: Diagnosis not present

## 2014-12-20 DIAGNOSIS — M109 Gout, unspecified: Secondary | ICD-10-CM | POA: Diagnosis not present

## 2014-12-20 DIAGNOSIS — K047 Periapical abscess without sinus: Secondary | ICD-10-CM | POA: Insufficient documentation

## 2014-12-20 DIAGNOSIS — N400 Enlarged prostate without lower urinary tract symptoms: Secondary | ICD-10-CM | POA: Insufficient documentation

## 2014-12-20 DIAGNOSIS — I120 Hypertensive chronic kidney disease with stage 5 chronic kidney disease or end stage renal disease: Secondary | ICD-10-CM | POA: Insufficient documentation

## 2014-12-20 DIAGNOSIS — K05219 Aggressive periodontitis, localized, unspecified severity: Secondary | ICD-10-CM

## 2014-12-20 LAB — CBC WITH DIFFERENTIAL/PLATELET
Basophils Absolute: 0 10*3/uL (ref 0.0–0.1)
Basophils Relative: 0 %
EOS ABS: 0.1 10*3/uL (ref 0.0–0.7)
Eosinophils Relative: 1 %
HCT: 36.9 % — ABNORMAL LOW (ref 39.0–52.0)
Hemoglobin: 11.7 g/dL — ABNORMAL LOW (ref 13.0–17.0)
LYMPHS ABS: 1.8 10*3/uL (ref 0.7–4.0)
Lymphocytes Relative: 20 %
MCH: 29.2 pg (ref 26.0–34.0)
MCHC: 31.7 g/dL (ref 30.0–36.0)
MCV: 92 fL (ref 78.0–100.0)
MONO ABS: 0.8 10*3/uL (ref 0.1–1.0)
MONOS PCT: 9 %
Neutro Abs: 7 10*3/uL (ref 1.7–7.7)
Neutrophils Relative %: 72 %
PLATELETS: 162 10*3/uL (ref 150–400)
RBC: 4.01 MIL/uL — AB (ref 4.22–5.81)
RDW: 16.5 % — AB (ref 11.5–15.5)
WBC: 8.9 10*3/uL (ref 4.0–10.5)

## 2014-12-20 LAB — BASIC METABOLIC PANEL
Anion gap: 13 (ref 5–15)
BUN: 86 mg/dL — AB (ref 6–20)
CALCIUM: 8.9 mg/dL (ref 8.9–10.3)
CO2: 25 mmol/L (ref 22–32)
CREATININE: 15.95 mg/dL — AB (ref 0.61–1.24)
Chloride: 104 mmol/L (ref 101–111)
GFR calc Af Amer: 4 mL/min — ABNORMAL LOW (ref >60)
GFR, EST NON AFRICAN AMERICAN: 3 mL/min — AB (ref >60)
GLUCOSE: 95 mg/dL (ref 65–99)
Potassium: 5.3 mmol/L — ABNORMAL HIGH (ref 3.5–5.1)
SODIUM: 142 mmol/L (ref 135–145)

## 2014-12-20 MED ORDER — PENICILLIN V POTASSIUM 500 MG PO TABS: 500.0000 mg | ORAL_TABLET | Freq: Four times a day (QID) | ORAL | Status: DC

## 2014-12-20 NOTE — ED Provider Notes (Signed)
CSN: [id number]     Arrival date & time 12/20/14  0533 History   First MD Initiated Contact with Patient 12/20/14 0550     Chief Complaint  Patient presents with  . Chest Pain     (Consider location/radiation/quality/duration/timing/severity/associated sxs/prior Treatment) HPI patient has end-stage renal disease and is on dialysis on Monday, Wednesday (today improved disease, and Fridays. He is not clear however it seems like he last went to dialysis on Friday, 5 days ago. He states he's been having some chest pain off and on for a few days. He states he only last a few seconds. States it is in his left upper chest. He states it sharp and it does not radiate. He started getting short of breath 2 days ago. He states he has chronic swelling of his legs. Patient states he missed dialysis cath as he is moving and he has pain in his mouth and thinks he has an abscess. He states he doesn't have a dentist.   PCP None Nephrology Dr Hinda Lenis  Past Medical History  Diagnosis Date  . Hypertension   . Diabetes mellitus   . Gout   . Chronic back pain   . BPH (benign prostatic hyperplasia)   . Anemia   . DVT (deep venous thrombosis) ([place])   . Neuropathy ([place])   . Decubitus ulcer     of 2nd toes of both feet.  . Edema leg   . Constipation   . Venous (peripheral) insufficiency   . Neuropathy, diabetic ([place])   . Physical deconditioning   . Poor balance   . Difficulty walking   . Lack of coordination   . Arthritis   . Renal insufficiency   . Chronic kidney disease   . Chronic kidney disease (CKD), stage IV (severe) ([place])   . Constipation    Past Surgical History  Procedure Laterality Date  . None    . Insertion of dialysis catheter    . Cyst excision Right     cyst removed on thumb 2001  . Insertion of dialysis catheter Left 03/09/2014    Procedure: INSERTION OF DIALYSIS CATHETER;  Surgeon: Angelia Mould, MD;  Location: [facility];  Service: Vascular;  Laterality: Left;  . Av  fistula placement Left 03/09/2014    Procedure: INSERTION OF ARTERIOVENOUS (AV) GORE-TEX GRAFT ARM;  Surgeon: Angelia Mould, MD;  Location: [facility] OR;  Service: Vascular;  Laterality: Left;   Family History  Problem Relation Age of Onset  . Diabetes Mother   . Hypertension Mother   . Heart failure Mother   . Hyperlipidemia Mother   . Heart attack Mother   . Cancer Father   . Diabetes Father   . Hypertension Father   . Hyperlipidemia Father    Social History  Substance Use Topics  . Smoking status: Never Smoker   . Smokeless tobacco: Never Used  . Alcohol Use: No  lives with spouse  Review of Systems  All other systems reviewed and are negative.     Allergies  Review of patient's allergies indicates no known allergies.  Home Medications   Prior to Admission medications   Medication Sig Start Date End Date Taking? Authorizing Provider  calcium acetate (PHOSLO) 667 MG capsule Take 1 capsule by mouth 3 (three) times daily. 04/11/14   Historical Provider, MD  clotrimazole (LOTRIMIN) 1 % cream Apply 1 application topically daily as needed. rash 10/03/14   Historical Provider, MD  colchicine 0.6 MG tablet Take 0.5 tablets (0.3  mg total) by mouth 2 (two) times a week. Take 0.6mg  (one tablet) by mouth every 1-2 hours until one of the following occurs: 1.  The pain is gone 2.  The maximum dose has been given ( no more than 3 tabs in 3 hours or 10 tabs in 24 hours) 3.  The side effects outweight the benefits 04/12/14   Noemi Chapel, MD  diclofenac sodium (VOLTAREN) 1 % GEL Apply to left wrist and hand three times daily 04/13/14   Lily Kocher, PA-C  doxycycline (VIBRAMYCIN) 100 MG capsule Take 1 capsule (100 mg total) by mouth 2 (two) times daily. 11/11/14   Fredia Sorrow, MD  gabapentin (NEURONTIN) 100 MG capsule Take 100 mg by mouth daily.  07/11/14   Historical Provider, MD  HYDROcodone-acetaminophen (NORCO/VICODIN) 5-325 MG tablet Take 1-2 tablets by mouth 2 (two) times daily as  needed for moderate pain. 11/11/14   Fredia Sorrow, MD  penicillin v potassium (VEETID) 500 MG tablet Take 1 tablet (500 mg total) by mouth 4 (four) times daily. 12/20/14   Rolland Porter, MD  predniSONE (DELTASONE) 10 MG tablet Take 4 tablets (40 mg total) by mouth daily. 11/11/14   Fredia Sorrow, MD  RENVELA 800 MG tablet Take 800 mg by mouth 3 (three) times daily with meals.  07/18/14   Historical Provider, MD  sildenafil (VIAGRA) 50 MG tablet Take 50 mg by mouth daily as needed for erectile dysfunction.    Historical Provider, MD  ULORIC 80 MG TABS Take 80 mg by mouth daily. 09/26/14   Historical Provider, MD   BP 118/100 mmHg  Pulse 100  Temp(Src) 97.9 F (36.6 C)  Resp 20  Ht 5\' 11"  (1.803 m)  Wt 260 lb (117.935 kg)  BMI 36.28 kg/m2  SpO2 99%  Vital signs normal   Physical Exam  Constitutional: He is oriented to person, place, and time. He appears well-developed and well-nourished.  Non-toxic appearance. He does not appear ill. No distress.  HENT:  Head: Normocephalic and atraumatic.  Right Ear: External ear normal.  Left Ear: External ear normal.  Nose: Nose normal. No mucosal edema or rhinorrhea.  Mouth/Throat: Oropharynx is clear and moist and mucous membranes are normal. No dental abscesses or uvula swelling.  Patient has several missing left upper premolars. There is some swelling of the upper gum consistent with abscess. This is in the area where there is no tooth.  Eyes: Conjunctivae and EOM are normal. Pupils are equal, round, and reactive to light.  Neck: Normal range of motion and full passive range of motion without pain. Neck supple.  Cardiovascular: Normal rate, regular rhythm and normal heart sounds.  Exam reveals no gallop and no friction rub.   No murmur heard. Pulmonary/Chest: Effort normal and breath sounds normal. No respiratory distress. He has no wheezes. He has no rhonchi. He has no rales. He exhibits no tenderness and no crepitus.  Abdominal: Soft. Normal  appearance and bowel sounds are normal. He exhibits no distension. There is no tenderness. There is no rebound and no guarding.  Musculoskeletal: Normal range of motion. He exhibits no edema or tenderness.  Moves all extremities well.   Neurological: He is alert and oriented to person, place, and time. He has normal strength. No cranial nerve deficit.  Skin: Skin is warm, dry and intact. No rash noted. No erythema. No pallor.  Psychiatric: He has a normal mood and affect. His speech is normal and behavior is normal. His mood appears not anxious.  Nursing  note and vitals reviewed.   ED Course  Procedures (including critical care time)  Patient wants me to call Dr. Hinda Lenis at this point which is 6 AM and the time patient should be at his own outpatient dialysis center. I've explained to him a do not call the doctors until his test results are back.   0642 AM Dr Hinda Lenis pt can still go to his dialysis this morning.    Labs Review Results for orders placed or performed during the hospital encounter of [id number]  Basic metabolic panel  Result Value Ref Range   Sodium 142 135 - 145 mmol/L   Potassium 5.3 (H) 3.5 - 5.1 mmol/L   Chloride 104 101 - 111 mmol/L   CO2 25 22 - 32 mmol/L   Glucose, Bld 95 65 - 99 mg/dL   BUN 86 (H) 6 - 20 mg/dL   Creatinine, Ser 15.95 (H) 0.61 - 1.24 mg/dL   Calcium 8.9 8.9 - 10.3 mg/dL   GFR calc non Af Amer 3 (L) >60 mL/min   GFR calc Af Amer 4 (L) >60 mL/min   Anion gap 13 5 - 15  CBC with Differential  Result Value Ref Range   WBC 8.9 4.0 - 10.5 K/uL   RBC 4.01 (L) 4.22 - 5.81 MIL/uL   Hemoglobin 11.7 (L) 13.0 - 17.0 g/dL   HCT 36.9 (L) 39.0 - 52.0 %   MCV 92.0 78.0 - 100.0 fL   MCH 29.2 26.0 - 34.0 pg   MCHC 31.7 30.0 - 36.0 g/dL   RDW 16.5 (H) 11.5 - 15.5 %   Platelets 162 150 - 400 K/uL   Neutrophils Relative % PENDING %   Neutro Abs PENDING 1.7 - 7.7 K/uL   Basophils Relative PENDING %   Basophils Absolute PENDING 0.0 - 0.1 K/uL    Lymphocytes Relative 20 %   Lymphs Abs 1.8 0.7 - 4.0 K/uL   Monocytes Relative 9 %   Monocytes Absolute 0.8 0.1 - 1.0 K/uL   Eosinophils Relative 1 %   Eosinophils Absolute 0.1 0.0 - 0.7 K/uL   Laboratory interpretation all normal except mild hyperkalemia, ESRD, mild anemia     Imaging Review No results found. I have personally reviewed and evaluated these images and lab results as part of my medical decision-making.   EKG Interpretation   Date/Time:  Wednesday December 20 2014 05:48:24 EST Ventricular Rate:  97 PR Interval:  130 QRS Duration: 127 QT Interval:  374 QTC Calculation: 475 R Axis:   -5 Text Interpretation:  Sinus rhythm Right bundle branch block ST elev,  probable normal early repol pattern No significant change since last  tracing 17 Nov 2014 Confirmed by Jackson North  MD-I, Elver Stadler ([id number]) on 12/20/2014  5:56:23 AM      MDM   Final diagnoses:  ESRD on hemodialysis (HCC)  Gum abscess   New Prescriptions   PENICILLIN V POTASSIUM (VEETID) 500 MG TABLET    Take 1 tablet (500 mg total) by mouth 4 (four) times daily.    Plan discharge  Rolland Porter, MD, Barbette Or, MD 12/20/14 [phone]

## 2014-12-20 NOTE — ED Notes (Signed)
Pt states he has missed last 5 dialysis appt. Pt now c/o chest pain.

## 2014-12-20 NOTE — Discharge Instructions (Signed)
GO TO DIALYSIS NOW!!!! Take the Pen VK for the abscess in your gum. Use heat on the area until it drains. You need to see a dentist if it isn't improving.

## 2014-12-27 IMAGING — CR DG CHEST 2V
2 series · 2 of 2 positions shown · non-contrast
Comparison: March 28, 2013.

CLINICAL DATA: Sore throat.

EXAM:
CHEST  2 VIEW

[view not recorded (1 of 2)]
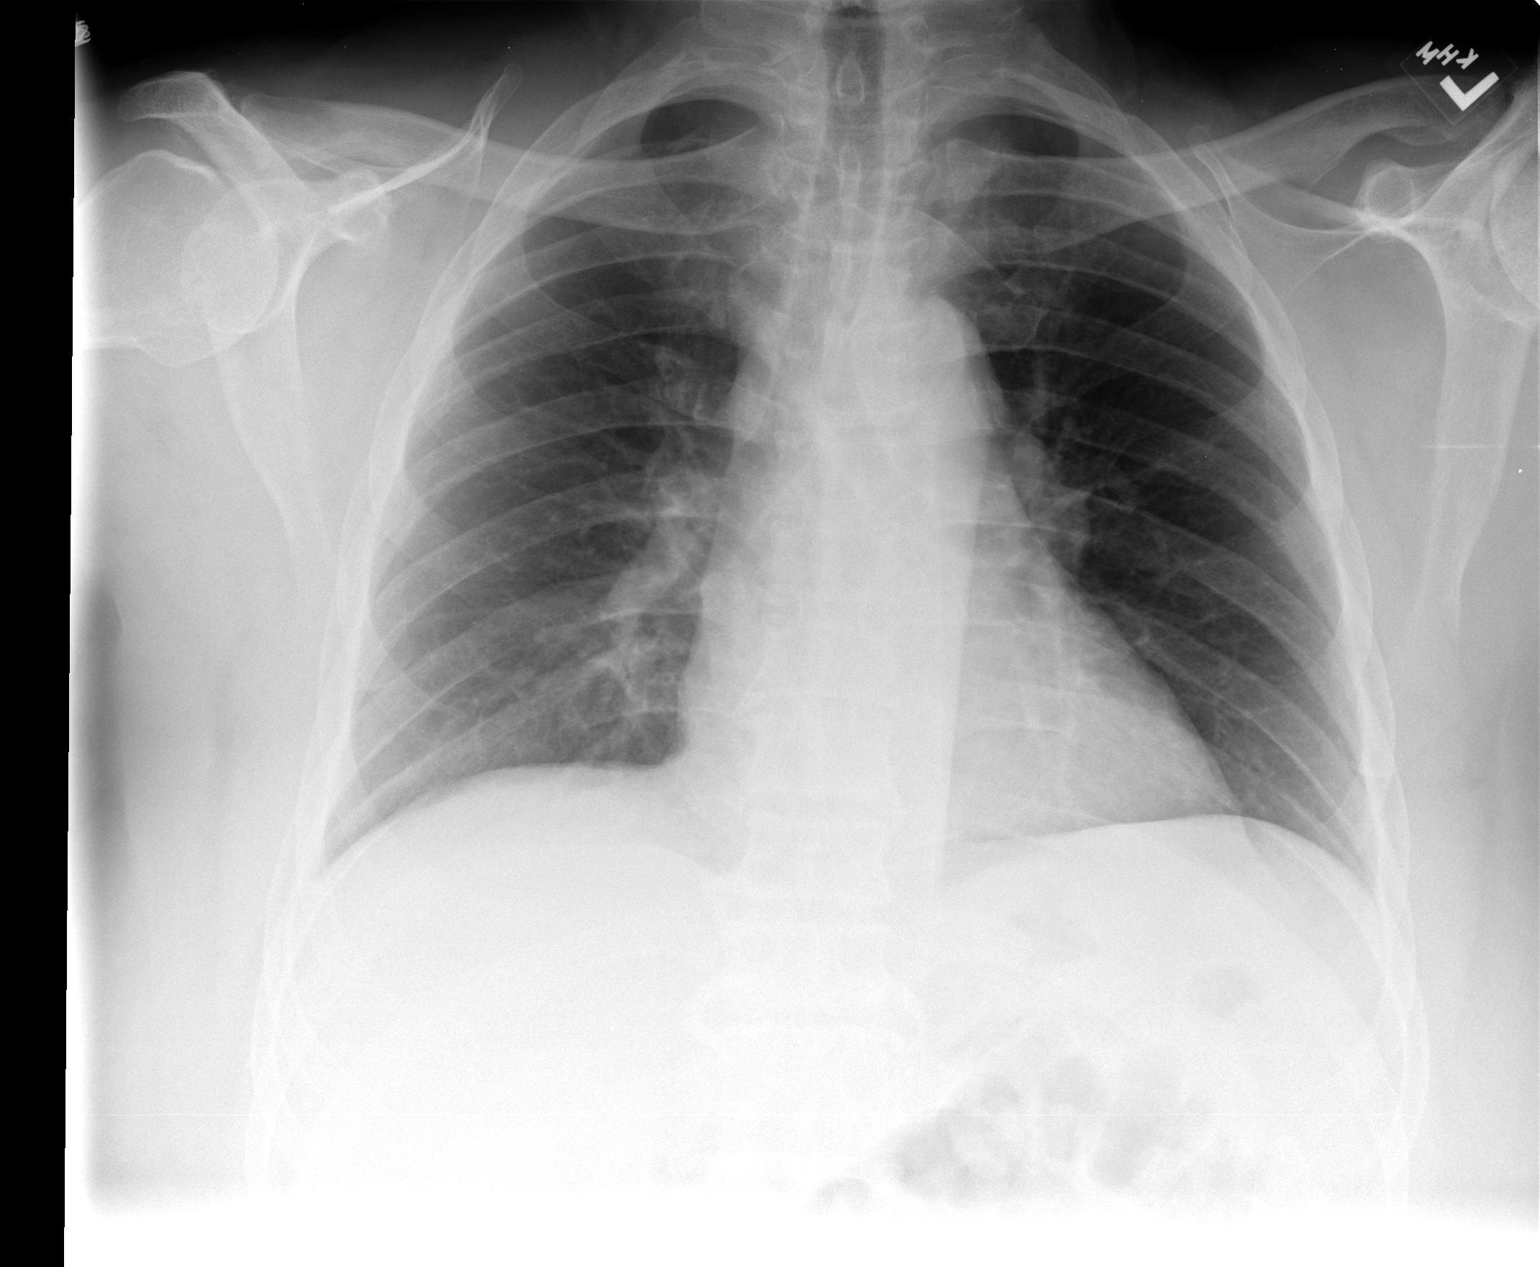

[view not recorded (2 of 2)]
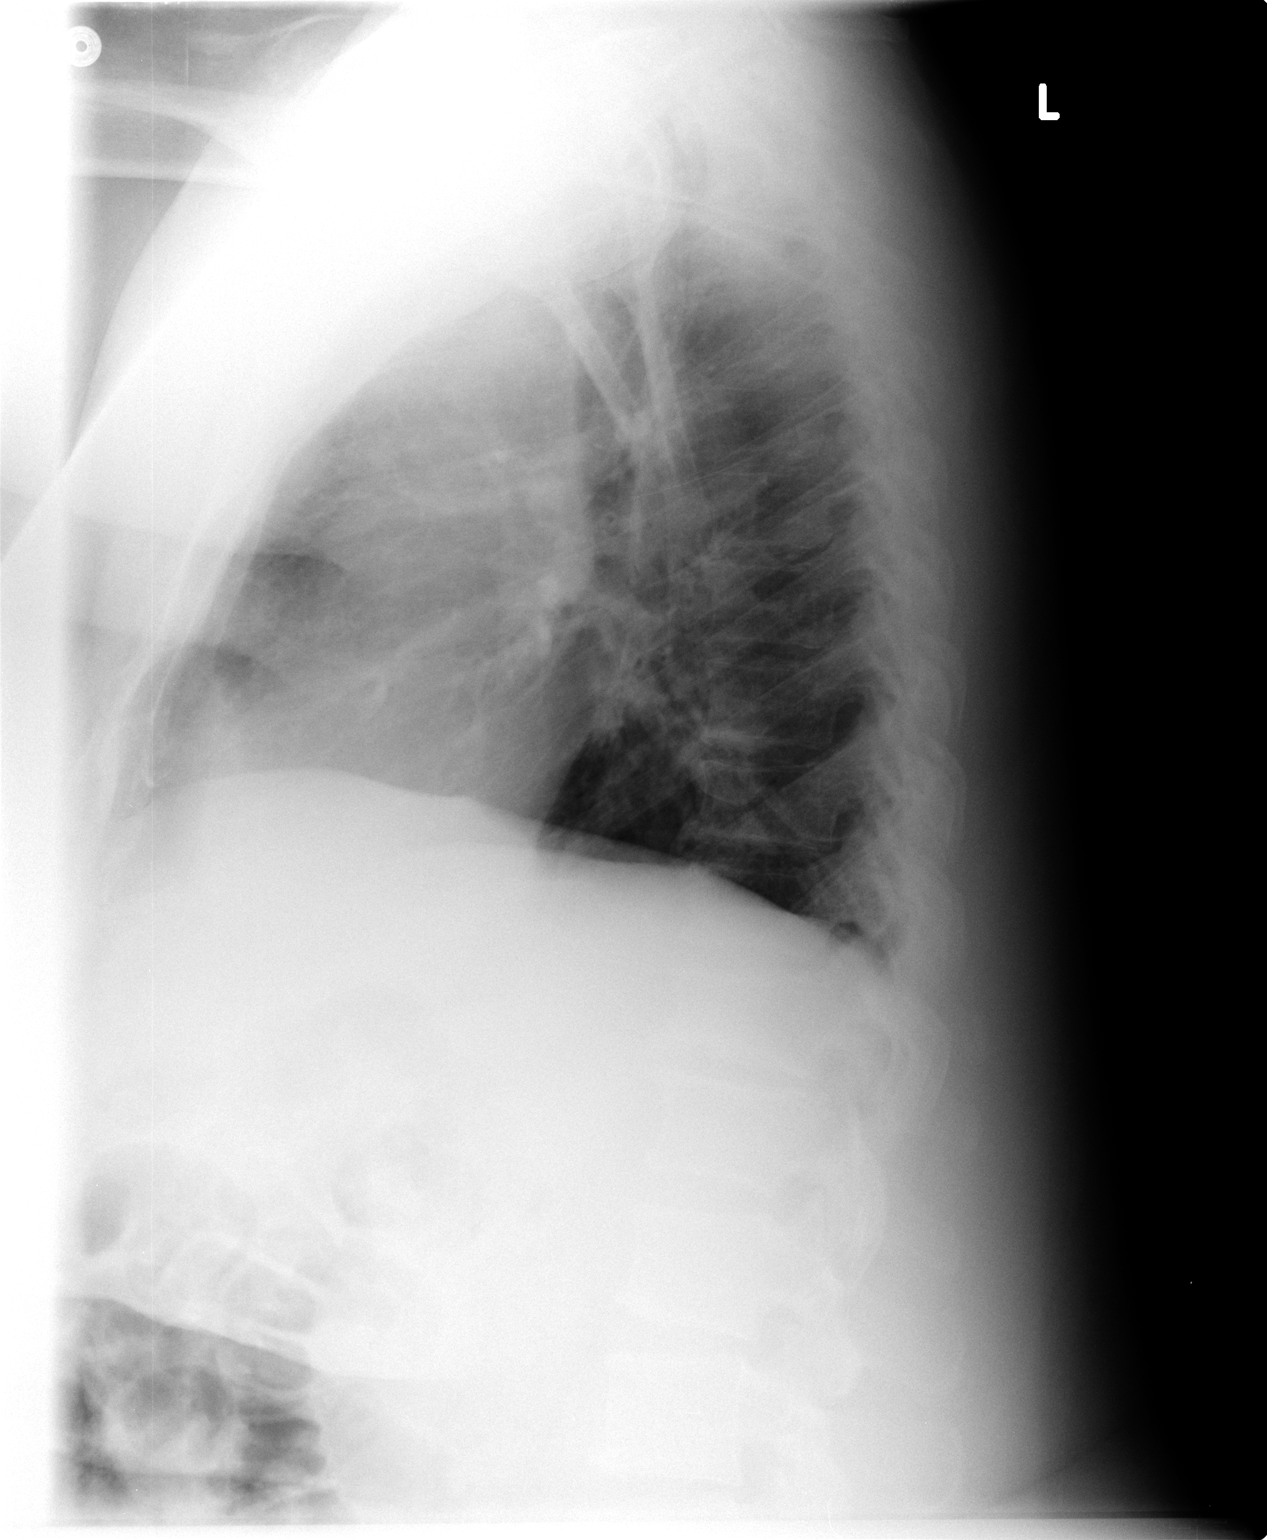

[2 of 2 positions shown; findings below may reference images not displayed]

FINDINGS: The heart size and mediastinal contours are within normal limits.
Both lungs are clear. No pneumothorax or pleural effusion is noted.
The visualized skeletal structures are unremarkable.
IMPRESSION: No acute cardiopulmonary abnormality seen.

## 2015-02-05 DIAGNOSIS — D509 Iron deficiency anemia, unspecified: Secondary | ICD-10-CM | POA: Diagnosis not present

## 2015-02-05 DIAGNOSIS — N186 End stage renal disease: Secondary | ICD-10-CM | POA: Diagnosis not present

## 2015-02-05 DIAGNOSIS — N2581 Secondary hyperparathyroidism of renal origin: Secondary | ICD-10-CM | POA: Diagnosis not present

## 2015-02-05 DIAGNOSIS — Z992 Dependence on renal dialysis: Secondary | ICD-10-CM | POA: Diagnosis not present

## 2015-02-08 DIAGNOSIS — N186 End stage renal disease: Secondary | ICD-10-CM | POA: Diagnosis not present

## 2015-02-08 DIAGNOSIS — I871 Compression of vein: Secondary | ICD-10-CM | POA: Diagnosis not present

## 2015-02-08 DIAGNOSIS — Z992 Dependence on renal dialysis: Secondary | ICD-10-CM | POA: Diagnosis not present

## 2015-02-08 DIAGNOSIS — T82858D Stenosis of vascular prosthetic devices, implants and grafts, subsequent encounter: Secondary | ICD-10-CM | POA: Diagnosis not present

## 2015-02-09 DIAGNOSIS — I10 Essential (primary) hypertension: Secondary | ICD-10-CM | POA: Diagnosis not present

## 2015-02-09 DIAGNOSIS — N529 Male erectile dysfunction, unspecified: Secondary | ICD-10-CM | POA: Diagnosis not present

## 2015-02-09 DIAGNOSIS — D509 Iron deficiency anemia, unspecified: Secondary | ICD-10-CM | POA: Diagnosis not present

## 2015-02-09 DIAGNOSIS — Z992 Dependence on renal dialysis: Secondary | ICD-10-CM | POA: Diagnosis not present

## 2015-02-09 DIAGNOSIS — N2581 Secondary hyperparathyroidism of renal origin: Secondary | ICD-10-CM | POA: Diagnosis not present

## 2015-02-09 DIAGNOSIS — N184 Chronic kidney disease, stage 4 (severe): Secondary | ICD-10-CM | POA: Diagnosis not present

## 2015-02-09 DIAGNOSIS — E119 Type 2 diabetes mellitus without complications: Secondary | ICD-10-CM | POA: Diagnosis not present

## 2015-02-09 DIAGNOSIS — N186 End stage renal disease: Secondary | ICD-10-CM | POA: Diagnosis not present

## 2015-02-12 DIAGNOSIS — D509 Iron deficiency anemia, unspecified: Secondary | ICD-10-CM | POA: Diagnosis not present

## 2015-02-12 DIAGNOSIS — Z992 Dependence on renal dialysis: Secondary | ICD-10-CM | POA: Diagnosis not present

## 2015-02-12 DIAGNOSIS — N186 End stage renal disease: Secondary | ICD-10-CM | POA: Diagnosis not present

## 2015-02-12 DIAGNOSIS — N2581 Secondary hyperparathyroidism of renal origin: Secondary | ICD-10-CM | POA: Diagnosis not present

## 2015-02-13 DIAGNOSIS — N50812 Left testicular pain: Secondary | ICD-10-CM | POA: Diagnosis not present

## 2015-02-13 DIAGNOSIS — R1030 Lower abdominal pain, unspecified: Secondary | ICD-10-CM | POA: Diagnosis not present

## 2015-02-14 DIAGNOSIS — N186 End stage renal disease: Secondary | ICD-10-CM | POA: Diagnosis not present

## 2015-02-14 DIAGNOSIS — D509 Iron deficiency anemia, unspecified: Secondary | ICD-10-CM | POA: Diagnosis not present

## 2015-02-14 DIAGNOSIS — Z992 Dependence on renal dialysis: Secondary | ICD-10-CM | POA: Diagnosis not present

## 2015-02-14 DIAGNOSIS — N2581 Secondary hyperparathyroidism of renal origin: Secondary | ICD-10-CM | POA: Diagnosis not present

## 2015-02-16 DIAGNOSIS — N186 End stage renal disease: Secondary | ICD-10-CM | POA: Diagnosis not present

## 2015-02-16 DIAGNOSIS — N2581 Secondary hyperparathyroidism of renal origin: Secondary | ICD-10-CM | POA: Diagnosis not present

## 2015-02-16 DIAGNOSIS — D509 Iron deficiency anemia, unspecified: Secondary | ICD-10-CM | POA: Diagnosis not present

## 2015-02-16 DIAGNOSIS — Z992 Dependence on renal dialysis: Secondary | ICD-10-CM | POA: Diagnosis not present

## 2015-02-19 DIAGNOSIS — N2581 Secondary hyperparathyroidism of renal origin: Secondary | ICD-10-CM | POA: Diagnosis not present

## 2015-02-19 DIAGNOSIS — D509 Iron deficiency anemia, unspecified: Secondary | ICD-10-CM | POA: Diagnosis not present

## 2015-02-19 DIAGNOSIS — N186 End stage renal disease: Secondary | ICD-10-CM | POA: Diagnosis not present

## 2015-02-19 DIAGNOSIS — Z992 Dependence on renal dialysis: Secondary | ICD-10-CM | POA: Diagnosis not present

## 2015-02-21 DIAGNOSIS — Z992 Dependence on renal dialysis: Secondary | ICD-10-CM | POA: Diagnosis not present

## 2015-02-21 DIAGNOSIS — N186 End stage renal disease: Secondary | ICD-10-CM | POA: Diagnosis not present

## 2015-02-21 DIAGNOSIS — N2581 Secondary hyperparathyroidism of renal origin: Secondary | ICD-10-CM | POA: Diagnosis not present

## 2015-02-21 DIAGNOSIS — D509 Iron deficiency anemia, unspecified: Secondary | ICD-10-CM | POA: Diagnosis not present

## 2015-02-23 DIAGNOSIS — N2581 Secondary hyperparathyroidism of renal origin: Secondary | ICD-10-CM | POA: Diagnosis not present

## 2015-02-23 DIAGNOSIS — Z992 Dependence on renal dialysis: Secondary | ICD-10-CM | POA: Diagnosis not present

## 2015-02-23 DIAGNOSIS — N186 End stage renal disease: Secondary | ICD-10-CM | POA: Diagnosis not present

## 2015-02-23 DIAGNOSIS — D509 Iron deficiency anemia, unspecified: Secondary | ICD-10-CM | POA: Diagnosis not present

## 2015-02-28 DIAGNOSIS — Z992 Dependence on renal dialysis: Secondary | ICD-10-CM | POA: Diagnosis not present

## 2015-02-28 DIAGNOSIS — D509 Iron deficiency anemia, unspecified: Secondary | ICD-10-CM | POA: Diagnosis not present

## 2015-02-28 DIAGNOSIS — N186 End stage renal disease: Secondary | ICD-10-CM | POA: Diagnosis not present

## 2015-02-28 DIAGNOSIS — N2581 Secondary hyperparathyroidism of renal origin: Secondary | ICD-10-CM | POA: Diagnosis not present

## 2015-03-02 DIAGNOSIS — D509 Iron deficiency anemia, unspecified: Secondary | ICD-10-CM | POA: Diagnosis not present

## 2015-03-02 DIAGNOSIS — N2581 Secondary hyperparathyroidism of renal origin: Secondary | ICD-10-CM | POA: Diagnosis not present

## 2015-03-02 DIAGNOSIS — N186 End stage renal disease: Secondary | ICD-10-CM | POA: Diagnosis not present

## 2015-03-02 DIAGNOSIS — Z992 Dependence on renal dialysis: Secondary | ICD-10-CM | POA: Diagnosis not present

## 2015-03-03 DIAGNOSIS — D509 Iron deficiency anemia, unspecified: Secondary | ICD-10-CM | POA: Diagnosis not present

## 2015-03-03 DIAGNOSIS — N2581 Secondary hyperparathyroidism of renal origin: Secondary | ICD-10-CM | POA: Diagnosis not present

## 2015-03-03 DIAGNOSIS — N186 End stage renal disease: Secondary | ICD-10-CM | POA: Diagnosis not present

## 2015-03-03 DIAGNOSIS — Z992 Dependence on renal dialysis: Secondary | ICD-10-CM | POA: Diagnosis not present

## 2015-03-06 DIAGNOSIS — Z992 Dependence on renal dialysis: Secondary | ICD-10-CM | POA: Diagnosis not present

## 2015-03-06 DIAGNOSIS — N186 End stage renal disease: Secondary | ICD-10-CM | POA: Diagnosis not present

## 2015-03-07 DIAGNOSIS — N2581 Secondary hyperparathyroidism of renal origin: Secondary | ICD-10-CM | POA: Diagnosis not present

## 2015-03-07 DIAGNOSIS — Z992 Dependence on renal dialysis: Secondary | ICD-10-CM | POA: Diagnosis not present

## 2015-03-07 DIAGNOSIS — N186 End stage renal disease: Secondary | ICD-10-CM | POA: Diagnosis not present

## 2015-03-07 DIAGNOSIS — D509 Iron deficiency anemia, unspecified: Secondary | ICD-10-CM | POA: Diagnosis not present

## 2015-03-09 DIAGNOSIS — D509 Iron deficiency anemia, unspecified: Secondary | ICD-10-CM | POA: Diagnosis not present

## 2015-03-09 DIAGNOSIS — N2581 Secondary hyperparathyroidism of renal origin: Secondary | ICD-10-CM | POA: Diagnosis not present

## 2015-03-09 DIAGNOSIS — Z992 Dependence on renal dialysis: Secondary | ICD-10-CM | POA: Diagnosis not present

## 2015-03-09 DIAGNOSIS — N186 End stage renal disease: Secondary | ICD-10-CM | POA: Diagnosis not present

## 2015-03-12 DIAGNOSIS — Z992 Dependence on renal dialysis: Secondary | ICD-10-CM | POA: Diagnosis not present

## 2015-03-12 DIAGNOSIS — T82858D Stenosis of vascular prosthetic devices, implants and grafts, subsequent encounter: Secondary | ICD-10-CM | POA: Diagnosis not present

## 2015-03-12 DIAGNOSIS — I871 Compression of vein: Secondary | ICD-10-CM | POA: Diagnosis not present

## 2015-03-12 DIAGNOSIS — N186 End stage renal disease: Secondary | ICD-10-CM | POA: Diagnosis not present

## 2015-03-14 DIAGNOSIS — Z992 Dependence on renal dialysis: Secondary | ICD-10-CM | POA: Diagnosis not present

## 2015-03-14 DIAGNOSIS — N2581 Secondary hyperparathyroidism of renal origin: Secondary | ICD-10-CM | POA: Diagnosis not present

## 2015-03-14 DIAGNOSIS — N186 End stage renal disease: Secondary | ICD-10-CM | POA: Diagnosis not present

## 2015-03-14 DIAGNOSIS — D509 Iron deficiency anemia, unspecified: Secondary | ICD-10-CM | POA: Diagnosis not present

## 2015-03-16 DIAGNOSIS — N186 End stage renal disease: Secondary | ICD-10-CM | POA: Diagnosis not present

## 2015-03-16 DIAGNOSIS — N2581 Secondary hyperparathyroidism of renal origin: Secondary | ICD-10-CM | POA: Diagnosis not present

## 2015-03-16 DIAGNOSIS — D509 Iron deficiency anemia, unspecified: Secondary | ICD-10-CM | POA: Diagnosis not present

## 2015-03-16 DIAGNOSIS — Z992 Dependence on renal dialysis: Secondary | ICD-10-CM | POA: Diagnosis not present

## 2015-03-19 DIAGNOSIS — N2581 Secondary hyperparathyroidism of renal origin: Secondary | ICD-10-CM | POA: Diagnosis not present

## 2015-03-19 DIAGNOSIS — D509 Iron deficiency anemia, unspecified: Secondary | ICD-10-CM | POA: Diagnosis not present

## 2015-03-19 DIAGNOSIS — Z992 Dependence on renal dialysis: Secondary | ICD-10-CM | POA: Diagnosis not present

## 2015-03-19 DIAGNOSIS — N186 End stage renal disease: Secondary | ICD-10-CM | POA: Diagnosis not present

## 2015-03-21 DIAGNOSIS — N186 End stage renal disease: Secondary | ICD-10-CM | POA: Diagnosis not present

## 2015-03-21 DIAGNOSIS — N2581 Secondary hyperparathyroidism of renal origin: Secondary | ICD-10-CM | POA: Diagnosis not present

## 2015-03-21 DIAGNOSIS — D509 Iron deficiency anemia, unspecified: Secondary | ICD-10-CM | POA: Diagnosis not present

## 2015-03-21 DIAGNOSIS — Z992 Dependence on renal dialysis: Secondary | ICD-10-CM | POA: Diagnosis not present

## 2015-03-23 DIAGNOSIS — N186 End stage renal disease: Secondary | ICD-10-CM | POA: Diagnosis not present

## 2015-03-23 DIAGNOSIS — N2581 Secondary hyperparathyroidism of renal origin: Secondary | ICD-10-CM | POA: Diagnosis not present

## 2015-03-23 DIAGNOSIS — D509 Iron deficiency anemia, unspecified: Secondary | ICD-10-CM | POA: Diagnosis not present

## 2015-03-23 DIAGNOSIS — Z992 Dependence on renal dialysis: Secondary | ICD-10-CM | POA: Diagnosis not present

## 2015-03-27 DIAGNOSIS — N186 End stage renal disease: Secondary | ICD-10-CM | POA: Diagnosis not present

## 2015-03-27 DIAGNOSIS — N2581 Secondary hyperparathyroidism of renal origin: Secondary | ICD-10-CM | POA: Diagnosis not present

## 2015-03-27 DIAGNOSIS — Z992 Dependence on renal dialysis: Secondary | ICD-10-CM | POA: Diagnosis not present

## 2015-03-27 DIAGNOSIS — D509 Iron deficiency anemia, unspecified: Secondary | ICD-10-CM | POA: Diagnosis not present

## 2015-03-30 DIAGNOSIS — N186 End stage renal disease: Secondary | ICD-10-CM | POA: Diagnosis not present

## 2015-03-30 DIAGNOSIS — Z992 Dependence on renal dialysis: Secondary | ICD-10-CM | POA: Diagnosis not present

## 2015-03-30 DIAGNOSIS — N2581 Secondary hyperparathyroidism of renal origin: Secondary | ICD-10-CM | POA: Diagnosis not present

## 2015-03-30 DIAGNOSIS — D509 Iron deficiency anemia, unspecified: Secondary | ICD-10-CM | POA: Diagnosis not present

## 2015-04-03 DIAGNOSIS — Z992 Dependence on renal dialysis: Secondary | ICD-10-CM | POA: Diagnosis not present

## 2015-04-03 DIAGNOSIS — N186 End stage renal disease: Secondary | ICD-10-CM | POA: Diagnosis not present

## 2015-04-03 DIAGNOSIS — T82858D Stenosis of vascular prosthetic devices, implants and grafts, subsequent encounter: Secondary | ICD-10-CM | POA: Diagnosis not present

## 2015-04-03 DIAGNOSIS — I871 Compression of vein: Secondary | ICD-10-CM | POA: Diagnosis not present

## 2015-04-04 DIAGNOSIS — N186 End stage renal disease: Secondary | ICD-10-CM | POA: Diagnosis not present

## 2015-04-04 DIAGNOSIS — N2581 Secondary hyperparathyroidism of renal origin: Secondary | ICD-10-CM | POA: Diagnosis not present

## 2015-04-04 DIAGNOSIS — D509 Iron deficiency anemia, unspecified: Secondary | ICD-10-CM | POA: Diagnosis not present

## 2015-04-04 DIAGNOSIS — Z992 Dependence on renal dialysis: Secondary | ICD-10-CM | POA: Diagnosis not present

## 2015-04-06 DIAGNOSIS — Z992 Dependence on renal dialysis: Secondary | ICD-10-CM | POA: Diagnosis not present

## 2015-04-06 DIAGNOSIS — N2581 Secondary hyperparathyroidism of renal origin: Secondary | ICD-10-CM | POA: Diagnosis not present

## 2015-04-06 DIAGNOSIS — D509 Iron deficiency anemia, unspecified: Secondary | ICD-10-CM | POA: Diagnosis not present

## 2015-04-06 DIAGNOSIS — N186 End stage renal disease: Secondary | ICD-10-CM | POA: Diagnosis not present

## 2015-04-09 DIAGNOSIS — D509 Iron deficiency anemia, unspecified: Secondary | ICD-10-CM | POA: Diagnosis not present

## 2015-04-09 DIAGNOSIS — N2581 Secondary hyperparathyroidism of renal origin: Secondary | ICD-10-CM | POA: Diagnosis not present

## 2015-04-09 DIAGNOSIS — N186 End stage renal disease: Secondary | ICD-10-CM | POA: Diagnosis not present

## 2015-04-09 DIAGNOSIS — Z992 Dependence on renal dialysis: Secondary | ICD-10-CM | POA: Diagnosis not present

## 2015-04-11 DIAGNOSIS — N2581 Secondary hyperparathyroidism of renal origin: Secondary | ICD-10-CM | POA: Diagnosis not present

## 2015-04-11 DIAGNOSIS — D509 Iron deficiency anemia, unspecified: Secondary | ICD-10-CM | POA: Diagnosis not present

## 2015-04-11 DIAGNOSIS — Z992 Dependence on renal dialysis: Secondary | ICD-10-CM | POA: Diagnosis not present

## 2015-04-11 DIAGNOSIS — N186 End stage renal disease: Secondary | ICD-10-CM | POA: Diagnosis not present

## 2015-04-13 DIAGNOSIS — Z992 Dependence on renal dialysis: Secondary | ICD-10-CM | POA: Diagnosis not present

## 2015-04-13 DIAGNOSIS — D509 Iron deficiency anemia, unspecified: Secondary | ICD-10-CM | POA: Diagnosis not present

## 2015-04-13 DIAGNOSIS — N2581 Secondary hyperparathyroidism of renal origin: Secondary | ICD-10-CM | POA: Diagnosis not present

## 2015-04-13 DIAGNOSIS — N186 End stage renal disease: Secondary | ICD-10-CM | POA: Diagnosis not present

## 2015-04-16 DIAGNOSIS — N2581 Secondary hyperparathyroidism of renal origin: Secondary | ICD-10-CM | POA: Diagnosis not present

## 2015-04-16 DIAGNOSIS — Z992 Dependence on renal dialysis: Secondary | ICD-10-CM | POA: Diagnosis not present

## 2015-04-16 DIAGNOSIS — D509 Iron deficiency anemia, unspecified: Secondary | ICD-10-CM | POA: Diagnosis not present

## 2015-04-16 DIAGNOSIS — N186 End stage renal disease: Secondary | ICD-10-CM | POA: Diagnosis not present

## 2015-04-18 DIAGNOSIS — N2581 Secondary hyperparathyroidism of renal origin: Secondary | ICD-10-CM | POA: Diagnosis not present

## 2015-04-18 DIAGNOSIS — N186 End stage renal disease: Secondary | ICD-10-CM | POA: Diagnosis not present

## 2015-04-18 DIAGNOSIS — Z992 Dependence on renal dialysis: Secondary | ICD-10-CM | POA: Diagnosis not present

## 2015-04-18 DIAGNOSIS — D509 Iron deficiency anemia, unspecified: Secondary | ICD-10-CM | POA: Diagnosis not present

## 2015-04-20 DIAGNOSIS — N2581 Secondary hyperparathyroidism of renal origin: Secondary | ICD-10-CM | POA: Diagnosis not present

## 2015-04-20 DIAGNOSIS — N186 End stage renal disease: Secondary | ICD-10-CM | POA: Diagnosis not present

## 2015-04-20 DIAGNOSIS — D509 Iron deficiency anemia, unspecified: Secondary | ICD-10-CM | POA: Diagnosis not present

## 2015-04-20 DIAGNOSIS — Z992 Dependence on renal dialysis: Secondary | ICD-10-CM | POA: Diagnosis not present

## 2015-04-24 DIAGNOSIS — N2581 Secondary hyperparathyroidism of renal origin: Secondary | ICD-10-CM | POA: Diagnosis not present

## 2015-04-24 DIAGNOSIS — Z992 Dependence on renal dialysis: Secondary | ICD-10-CM | POA: Diagnosis not present

## 2015-04-24 DIAGNOSIS — N186 End stage renal disease: Secondary | ICD-10-CM | POA: Diagnosis not present

## 2015-04-24 DIAGNOSIS — Z09 Encounter for follow-up examination after completed treatment for conditions other than malignant neoplasm: Secondary | ICD-10-CM | POA: Diagnosis not present

## 2015-04-24 DIAGNOSIS — Z6836 Body mass index (BMI) 36.0-36.9, adult: Secondary | ICD-10-CM | POA: Diagnosis not present

## 2015-04-24 DIAGNOSIS — K0889 Other specified disorders of teeth and supporting structures: Secondary | ICD-10-CM | POA: Diagnosis not present

## 2015-04-24 DIAGNOSIS — D509 Iron deficiency anemia, unspecified: Secondary | ICD-10-CM | POA: Diagnosis not present

## 2015-04-25 DIAGNOSIS — D509 Iron deficiency anemia, unspecified: Secondary | ICD-10-CM | POA: Diagnosis not present

## 2015-04-25 DIAGNOSIS — N2581 Secondary hyperparathyroidism of renal origin: Secondary | ICD-10-CM | POA: Diagnosis not present

## 2015-04-25 DIAGNOSIS — Z992 Dependence on renal dialysis: Secondary | ICD-10-CM | POA: Diagnosis not present

## 2015-04-25 DIAGNOSIS — N186 End stage renal disease: Secondary | ICD-10-CM | POA: Diagnosis not present

## 2015-04-27 DIAGNOSIS — Z992 Dependence on renal dialysis: Secondary | ICD-10-CM | POA: Diagnosis not present

## 2015-04-27 DIAGNOSIS — D509 Iron deficiency anemia, unspecified: Secondary | ICD-10-CM | POA: Diagnosis not present

## 2015-04-27 DIAGNOSIS — N186 End stage renal disease: Secondary | ICD-10-CM | POA: Diagnosis not present

## 2015-04-27 DIAGNOSIS — N2581 Secondary hyperparathyroidism of renal origin: Secondary | ICD-10-CM | POA: Diagnosis not present

## 2015-04-30 DIAGNOSIS — N2581 Secondary hyperparathyroidism of renal origin: Secondary | ICD-10-CM | POA: Diagnosis not present

## 2015-04-30 DIAGNOSIS — Z992 Dependence on renal dialysis: Secondary | ICD-10-CM | POA: Diagnosis not present

## 2015-04-30 DIAGNOSIS — N186 End stage renal disease: Secondary | ICD-10-CM | POA: Diagnosis not present

## 2015-04-30 DIAGNOSIS — D509 Iron deficiency anemia, unspecified: Secondary | ICD-10-CM | POA: Diagnosis not present

## 2015-05-02 DIAGNOSIS — N2581 Secondary hyperparathyroidism of renal origin: Secondary | ICD-10-CM | POA: Diagnosis not present

## 2015-05-02 DIAGNOSIS — Z992 Dependence on renal dialysis: Secondary | ICD-10-CM | POA: Diagnosis not present

## 2015-05-02 DIAGNOSIS — D509 Iron deficiency anemia, unspecified: Secondary | ICD-10-CM | POA: Diagnosis not present

## 2015-05-02 DIAGNOSIS — N186 End stage renal disease: Secondary | ICD-10-CM | POA: Diagnosis not present

## 2015-05-04 DIAGNOSIS — N186 End stage renal disease: Secondary | ICD-10-CM | POA: Diagnosis not present

## 2015-05-04 DIAGNOSIS — Z992 Dependence on renal dialysis: Secondary | ICD-10-CM | POA: Diagnosis not present

## 2015-05-04 DIAGNOSIS — N2581 Secondary hyperparathyroidism of renal origin: Secondary | ICD-10-CM | POA: Diagnosis not present

## 2015-05-04 DIAGNOSIS — D509 Iron deficiency anemia, unspecified: Secondary | ICD-10-CM | POA: Diagnosis not present

## 2015-05-07 DIAGNOSIS — N186 End stage renal disease: Secondary | ICD-10-CM | POA: Diagnosis not present

## 2015-05-07 DIAGNOSIS — D509 Iron deficiency anemia, unspecified: Secondary | ICD-10-CM | POA: Diagnosis not present

## 2015-05-07 DIAGNOSIS — Z992 Dependence on renal dialysis: Secondary | ICD-10-CM | POA: Diagnosis not present

## 2015-05-07 DIAGNOSIS — N2581 Secondary hyperparathyroidism of renal origin: Secondary | ICD-10-CM | POA: Diagnosis not present

## 2015-05-11 DIAGNOSIS — N186 End stage renal disease: Secondary | ICD-10-CM | POA: Diagnosis not present

## 2015-05-11 DIAGNOSIS — D509 Iron deficiency anemia, unspecified: Secondary | ICD-10-CM | POA: Diagnosis not present

## 2015-05-11 DIAGNOSIS — Z992 Dependence on renal dialysis: Secondary | ICD-10-CM | POA: Diagnosis not present

## 2015-05-11 DIAGNOSIS — N2581 Secondary hyperparathyroidism of renal origin: Secondary | ICD-10-CM | POA: Diagnosis not present

## 2015-05-14 DIAGNOSIS — N186 End stage renal disease: Secondary | ICD-10-CM | POA: Diagnosis not present

## 2015-05-14 DIAGNOSIS — N2581 Secondary hyperparathyroidism of renal origin: Secondary | ICD-10-CM | POA: Diagnosis not present

## 2015-05-14 DIAGNOSIS — Z992 Dependence on renal dialysis: Secondary | ICD-10-CM | POA: Diagnosis not present

## 2015-05-14 DIAGNOSIS — D509 Iron deficiency anemia, unspecified: Secondary | ICD-10-CM | POA: Diagnosis not present

## 2015-05-16 DIAGNOSIS — N2581 Secondary hyperparathyroidism of renal origin: Secondary | ICD-10-CM | POA: Diagnosis not present

## 2015-05-16 DIAGNOSIS — N186 End stage renal disease: Secondary | ICD-10-CM | POA: Diagnosis not present

## 2015-05-16 DIAGNOSIS — Z992 Dependence on renal dialysis: Secondary | ICD-10-CM | POA: Diagnosis not present

## 2015-05-16 DIAGNOSIS — D509 Iron deficiency anemia, unspecified: Secondary | ICD-10-CM | POA: Diagnosis not present

## 2015-05-18 DIAGNOSIS — D509 Iron deficiency anemia, unspecified: Secondary | ICD-10-CM | POA: Diagnosis not present

## 2015-05-18 DIAGNOSIS — N186 End stage renal disease: Secondary | ICD-10-CM | POA: Diagnosis not present

## 2015-05-18 DIAGNOSIS — Z992 Dependence on renal dialysis: Secondary | ICD-10-CM | POA: Diagnosis not present

## 2015-05-18 DIAGNOSIS — N2581 Secondary hyperparathyroidism of renal origin: Secondary | ICD-10-CM | POA: Diagnosis not present

## 2015-05-21 ENCOUNTER — Encounter (HOSPITAL_COMMUNITY): Payer: Self-pay

## 2015-05-23 DIAGNOSIS — Z992 Dependence on renal dialysis: Secondary | ICD-10-CM | POA: Diagnosis not present

## 2015-05-23 DIAGNOSIS — N186 End stage renal disease: Secondary | ICD-10-CM | POA: Diagnosis not present

## 2015-05-23 DIAGNOSIS — N2581 Secondary hyperparathyroidism of renal origin: Secondary | ICD-10-CM | POA: Diagnosis not present

## 2015-05-23 DIAGNOSIS — D509 Iron deficiency anemia, unspecified: Secondary | ICD-10-CM | POA: Diagnosis not present

## 2015-05-25 DIAGNOSIS — Z992 Dependence on renal dialysis: Secondary | ICD-10-CM | POA: Diagnosis not present

## 2015-05-25 DIAGNOSIS — N2581 Secondary hyperparathyroidism of renal origin: Secondary | ICD-10-CM | POA: Diagnosis not present

## 2015-05-25 DIAGNOSIS — D509 Iron deficiency anemia, unspecified: Secondary | ICD-10-CM | POA: Diagnosis not present

## 2015-05-25 DIAGNOSIS — N186 End stage renal disease: Secondary | ICD-10-CM | POA: Diagnosis not present

## 2015-05-29 ENCOUNTER — Ambulatory Visit: Payer: Self-pay | Admitting: Family Medicine

## 2015-05-30 DIAGNOSIS — D509 Iron deficiency anemia, unspecified: Secondary | ICD-10-CM | POA: Diagnosis not present

## 2015-05-30 DIAGNOSIS — Z992 Dependence on renal dialysis: Secondary | ICD-10-CM | POA: Diagnosis not present

## 2015-05-30 DIAGNOSIS — N2581 Secondary hyperparathyroidism of renal origin: Secondary | ICD-10-CM | POA: Diagnosis not present

## 2015-05-30 DIAGNOSIS — T82868A Thrombosis of vascular prosthetic devices, implants and grafts, initial encounter: Secondary | ICD-10-CM | POA: Diagnosis not present

## 2015-05-30 DIAGNOSIS — N186 End stage renal disease: Secondary | ICD-10-CM | POA: Diagnosis not present

## 2015-06-01 DIAGNOSIS — D509 Iron deficiency anemia, unspecified: Secondary | ICD-10-CM | POA: Diagnosis not present

## 2015-06-01 DIAGNOSIS — N186 End stage renal disease: Secondary | ICD-10-CM | POA: Diagnosis not present

## 2015-06-01 DIAGNOSIS — Z992 Dependence on renal dialysis: Secondary | ICD-10-CM | POA: Diagnosis not present

## 2015-06-01 DIAGNOSIS — N2581 Secondary hyperparathyroidism of renal origin: Secondary | ICD-10-CM | POA: Diagnosis not present

## 2015-06-03 DIAGNOSIS — Z992 Dependence on renal dialysis: Secondary | ICD-10-CM | POA: Diagnosis not present

## 2015-06-03 DIAGNOSIS — N186 End stage renal disease: Secondary | ICD-10-CM | POA: Diagnosis not present

## 2015-06-04 DIAGNOSIS — R112 Nausea with vomiting, unspecified: Secondary | ICD-10-CM | POA: Diagnosis not present

## 2015-06-04 DIAGNOSIS — D509 Iron deficiency anemia, unspecified: Secondary | ICD-10-CM | POA: Diagnosis not present

## 2015-06-04 DIAGNOSIS — N2581 Secondary hyperparathyroidism of renal origin: Secondary | ICD-10-CM | POA: Diagnosis not present

## 2015-06-04 DIAGNOSIS — N186 End stage renal disease: Secondary | ICD-10-CM | POA: Diagnosis not present

## 2015-06-04 DIAGNOSIS — Z992 Dependence on renal dialysis: Secondary | ICD-10-CM | POA: Diagnosis not present

## 2015-06-06 DIAGNOSIS — R112 Nausea with vomiting, unspecified: Secondary | ICD-10-CM | POA: Diagnosis not present

## 2015-06-06 DIAGNOSIS — Z992 Dependence on renal dialysis: Secondary | ICD-10-CM | POA: Diagnosis not present

## 2015-06-06 DIAGNOSIS — D509 Iron deficiency anemia, unspecified: Secondary | ICD-10-CM | POA: Diagnosis not present

## 2015-06-06 DIAGNOSIS — N186 End stage renal disease: Secondary | ICD-10-CM | POA: Diagnosis not present

## 2015-06-06 DIAGNOSIS — N2581 Secondary hyperparathyroidism of renal origin: Secondary | ICD-10-CM | POA: Diagnosis not present

## 2015-06-08 DIAGNOSIS — N186 End stage renal disease: Secondary | ICD-10-CM | POA: Diagnosis not present

## 2015-06-08 DIAGNOSIS — R112 Nausea with vomiting, unspecified: Secondary | ICD-10-CM | POA: Diagnosis not present

## 2015-06-08 DIAGNOSIS — D509 Iron deficiency anemia, unspecified: Secondary | ICD-10-CM | POA: Diagnosis not present

## 2015-06-08 DIAGNOSIS — Z992 Dependence on renal dialysis: Secondary | ICD-10-CM | POA: Diagnosis not present

## 2015-06-08 DIAGNOSIS — N2581 Secondary hyperparathyroidism of renal origin: Secondary | ICD-10-CM | POA: Diagnosis not present

## 2015-06-12 DIAGNOSIS — N186 End stage renal disease: Secondary | ICD-10-CM | POA: Diagnosis not present

## 2015-06-12 DIAGNOSIS — I871 Compression of vein: Secondary | ICD-10-CM | POA: Diagnosis not present

## 2015-06-12 DIAGNOSIS — D509 Iron deficiency anemia, unspecified: Secondary | ICD-10-CM | POA: Diagnosis not present

## 2015-06-12 DIAGNOSIS — T82868A Thrombosis of vascular prosthetic devices, implants and grafts, initial encounter: Secondary | ICD-10-CM | POA: Diagnosis not present

## 2015-06-12 DIAGNOSIS — Z0000 Encounter for general adult medical examination without abnormal findings: Secondary | ICD-10-CM | POA: Diagnosis not present

## 2015-06-12 DIAGNOSIS — N2581 Secondary hyperparathyroidism of renal origin: Secondary | ICD-10-CM | POA: Diagnosis not present

## 2015-06-12 DIAGNOSIS — Z992 Dependence on renal dialysis: Secondary | ICD-10-CM | POA: Diagnosis not present

## 2015-06-12 DIAGNOSIS — I771 Stricture of artery: Secondary | ICD-10-CM | POA: Diagnosis not present

## 2015-06-12 DIAGNOSIS — R112 Nausea with vomiting, unspecified: Secondary | ICD-10-CM | POA: Diagnosis not present

## 2015-06-13 DIAGNOSIS — D509 Iron deficiency anemia, unspecified: Secondary | ICD-10-CM | POA: Diagnosis not present

## 2015-06-13 DIAGNOSIS — N2581 Secondary hyperparathyroidism of renal origin: Secondary | ICD-10-CM | POA: Diagnosis not present

## 2015-06-13 DIAGNOSIS — Z992 Dependence on renal dialysis: Secondary | ICD-10-CM | POA: Diagnosis not present

## 2015-06-13 DIAGNOSIS — N186 End stage renal disease: Secondary | ICD-10-CM | POA: Diagnosis not present

## 2015-06-13 DIAGNOSIS — R112 Nausea with vomiting, unspecified: Secondary | ICD-10-CM | POA: Diagnosis not present

## 2015-06-15 DIAGNOSIS — Z992 Dependence on renal dialysis: Secondary | ICD-10-CM | POA: Diagnosis not present

## 2015-06-15 DIAGNOSIS — N2581 Secondary hyperparathyroidism of renal origin: Secondary | ICD-10-CM | POA: Diagnosis not present

## 2015-06-15 DIAGNOSIS — N186 End stage renal disease: Secondary | ICD-10-CM | POA: Diagnosis not present

## 2015-06-15 DIAGNOSIS — D509 Iron deficiency anemia, unspecified: Secondary | ICD-10-CM | POA: Diagnosis not present

## 2015-06-15 DIAGNOSIS — R112 Nausea with vomiting, unspecified: Secondary | ICD-10-CM | POA: Diagnosis not present

## 2015-06-18 DIAGNOSIS — R112 Nausea with vomiting, unspecified: Secondary | ICD-10-CM | POA: Diagnosis not present

## 2015-06-18 DIAGNOSIS — Z992 Dependence on renal dialysis: Secondary | ICD-10-CM | POA: Diagnosis not present

## 2015-06-18 DIAGNOSIS — N2581 Secondary hyperparathyroidism of renal origin: Secondary | ICD-10-CM | POA: Diagnosis not present

## 2015-06-18 DIAGNOSIS — N186 End stage renal disease: Secondary | ICD-10-CM | POA: Diagnosis not present

## 2015-06-18 DIAGNOSIS — D509 Iron deficiency anemia, unspecified: Secondary | ICD-10-CM | POA: Diagnosis not present

## 2015-06-20 DIAGNOSIS — D509 Iron deficiency anemia, unspecified: Secondary | ICD-10-CM | POA: Diagnosis not present

## 2015-06-20 DIAGNOSIS — N2581 Secondary hyperparathyroidism of renal origin: Secondary | ICD-10-CM | POA: Diagnosis not present

## 2015-06-20 DIAGNOSIS — Z992 Dependence on renal dialysis: Secondary | ICD-10-CM | POA: Diagnosis not present

## 2015-06-20 DIAGNOSIS — R112 Nausea with vomiting, unspecified: Secondary | ICD-10-CM | POA: Diagnosis not present

## 2015-06-20 DIAGNOSIS — N186 End stage renal disease: Secondary | ICD-10-CM | POA: Diagnosis not present

## 2015-06-22 DIAGNOSIS — N2581 Secondary hyperparathyroidism of renal origin: Secondary | ICD-10-CM | POA: Diagnosis not present

## 2015-06-22 DIAGNOSIS — D509 Iron deficiency anemia, unspecified: Secondary | ICD-10-CM | POA: Diagnosis not present

## 2015-06-22 DIAGNOSIS — R112 Nausea with vomiting, unspecified: Secondary | ICD-10-CM | POA: Diagnosis not present

## 2015-06-22 DIAGNOSIS — N186 End stage renal disease: Secondary | ICD-10-CM | POA: Diagnosis not present

## 2015-06-22 DIAGNOSIS — Z992 Dependence on renal dialysis: Secondary | ICD-10-CM | POA: Diagnosis not present

## 2015-06-25 DIAGNOSIS — Z992 Dependence on renal dialysis: Secondary | ICD-10-CM | POA: Diagnosis not present

## 2015-06-25 DIAGNOSIS — N186 End stage renal disease: Secondary | ICD-10-CM | POA: Diagnosis not present

## 2015-06-25 DIAGNOSIS — D509 Iron deficiency anemia, unspecified: Secondary | ICD-10-CM | POA: Diagnosis not present

## 2015-06-25 DIAGNOSIS — R112 Nausea with vomiting, unspecified: Secondary | ICD-10-CM | POA: Diagnosis not present

## 2015-06-25 DIAGNOSIS — N2581 Secondary hyperparathyroidism of renal origin: Secondary | ICD-10-CM | POA: Diagnosis not present

## 2015-06-27 DIAGNOSIS — R112 Nausea with vomiting, unspecified: Secondary | ICD-10-CM | POA: Diagnosis not present

## 2015-06-27 DIAGNOSIS — D509 Iron deficiency anemia, unspecified: Secondary | ICD-10-CM | POA: Diagnosis not present

## 2015-06-27 DIAGNOSIS — N2581 Secondary hyperparathyroidism of renal origin: Secondary | ICD-10-CM | POA: Diagnosis not present

## 2015-06-27 DIAGNOSIS — N186 End stage renal disease: Secondary | ICD-10-CM | POA: Diagnosis not present

## 2015-06-27 DIAGNOSIS — Z992 Dependence on renal dialysis: Secondary | ICD-10-CM | POA: Diagnosis not present

## 2015-06-29 DIAGNOSIS — N186 End stage renal disease: Secondary | ICD-10-CM | POA: Diagnosis not present

## 2015-06-29 DIAGNOSIS — D509 Iron deficiency anemia, unspecified: Secondary | ICD-10-CM | POA: Diagnosis not present

## 2015-06-29 DIAGNOSIS — Z992 Dependence on renal dialysis: Secondary | ICD-10-CM | POA: Diagnosis not present

## 2015-06-29 DIAGNOSIS — N2581 Secondary hyperparathyroidism of renal origin: Secondary | ICD-10-CM | POA: Diagnosis not present

## 2015-06-29 DIAGNOSIS — R112 Nausea with vomiting, unspecified: Secondary | ICD-10-CM | POA: Diagnosis not present

## 2015-07-02 DIAGNOSIS — N2581 Secondary hyperparathyroidism of renal origin: Secondary | ICD-10-CM | POA: Diagnosis not present

## 2015-07-02 DIAGNOSIS — N186 End stage renal disease: Secondary | ICD-10-CM | POA: Diagnosis not present

## 2015-07-02 DIAGNOSIS — Z992 Dependence on renal dialysis: Secondary | ICD-10-CM | POA: Diagnosis not present

## 2015-07-02 DIAGNOSIS — R112 Nausea with vomiting, unspecified: Secondary | ICD-10-CM | POA: Diagnosis not present

## 2015-07-02 DIAGNOSIS — D509 Iron deficiency anemia, unspecified: Secondary | ICD-10-CM | POA: Diagnosis not present

## 2015-07-04 DIAGNOSIS — N186 End stage renal disease: Secondary | ICD-10-CM | POA: Diagnosis not present

## 2015-07-04 DIAGNOSIS — N2581 Secondary hyperparathyroidism of renal origin: Secondary | ICD-10-CM | POA: Diagnosis not present

## 2015-07-04 DIAGNOSIS — Z992 Dependence on renal dialysis: Secondary | ICD-10-CM | POA: Diagnosis not present

## 2015-07-04 DIAGNOSIS — D509 Iron deficiency anemia, unspecified: Secondary | ICD-10-CM | POA: Diagnosis not present

## 2015-07-04 DIAGNOSIS — R112 Nausea with vomiting, unspecified: Secondary | ICD-10-CM | POA: Diagnosis not present

## 2015-07-05 ENCOUNTER — Other Ambulatory Visit (HOSPITAL_COMMUNITY)
Admission: RE | Admit: 2015-07-05 | Discharge: 2015-07-05 | Disposition: A | Discharge status: Home / Self Care | Payer: Medicare Other | Source: Ambulatory Visit | Attending: Internal Medicine | Admitting: Internal Medicine

## 2015-07-05 DIAGNOSIS — E1140 Type 2 diabetes mellitus with diabetic neuropathy, unspecified: Secondary | ICD-10-CM | POA: Diagnosis not present

## 2015-07-05 DIAGNOSIS — M109 Gout, unspecified: Secondary | ICD-10-CM | POA: Diagnosis not present

## 2015-07-05 DIAGNOSIS — N184 Chronic kidney disease, stage 4 (severe): Secondary | ICD-10-CM | POA: Insufficient documentation

## 2015-07-05 DIAGNOSIS — N521 Erectile dysfunction due to diseases classified elsewhere: Secondary | ICD-10-CM | POA: Diagnosis not present

## 2015-07-05 LAB — CBC WITH DIFFERENTIAL/PLATELET
BASOS ABS: 0 10*3/uL (ref 0.0–0.1)
Basophils Relative: 0 %
EOS ABS: 0.2 10*3/uL (ref 0.0–0.7)
EOS PCT: 2 %
HCT: 36.6 % — ABNORMAL LOW (ref 39.0–52.0)
Hemoglobin: 11.7 g/dL — ABNORMAL LOW (ref 13.0–17.0)
LYMPHS PCT: 21 %
Lymphs Abs: 2.1 10*3/uL (ref 0.7–4.0)
MCH: 29.5 pg (ref 26.0–34.0)
MCHC: 32 g/dL (ref 30.0–36.0)
MCV: 92.4 fL (ref 78.0–100.0)
MONO ABS: 0.9 10*3/uL (ref 0.1–1.0)
Monocytes Relative: 9 %
Neutro Abs: 6.8 10*3/uL (ref 1.7–7.7)
Neutrophils Relative %: 68 %
PLATELETS: 159 10*3/uL (ref 150–400)
RBC: 3.96 MIL/uL — AB (ref 4.22–5.81)
RDW: 16.9 % — AB (ref 11.5–15.5)
WBC: 10 10*3/uL (ref 4.0–10.5)

## 2015-07-05 LAB — COMPREHENSIVE METABOLIC PANEL
ALT: 48 U/L (ref 17–63)
AST: 63 U/L — AB (ref 15–41)
Albumin: 3.9 g/dL (ref 3.5–5.0)
Alkaline Phosphatase: 52 U/L (ref 38–126)
Anion gap: 8 (ref 5–15)
BUN: 30 mg/dL — ABNORMAL HIGH (ref 6–20)
CHLORIDE: 101 mmol/L (ref 101–111)
CO2: 29 mmol/L (ref 22–32)
CREATININE: 9.54 mg/dL — AB (ref 0.61–1.24)
Calcium: 9 mg/dL (ref 8.9–10.3)
GFR, EST AFRICAN AMERICAN: 7 mL/min — AB (ref >60)
GFR, EST NON AFRICAN AMERICAN: 6 mL/min — AB (ref >60)
Glucose, Bld: 84 mg/dL (ref 65–99)
POTASSIUM: 4.2 mmol/L (ref 3.5–5.1)
SODIUM: 138 mmol/L (ref 135–145)
TOTAL PROTEIN: 7.7 g/dL (ref 6.5–8.1)
Total Bilirubin: 1.2 mg/dL (ref 0.3–1.2)

## 2015-07-05 LAB — LIPID PANEL
CHOLESTEROL: 101 mg/dL (ref 0–200)
HDL: 31 mg/dL — ABNORMAL LOW (ref >40)
LDL Cholesterol: 55 mg/dL (ref 0–99)
Total CHOL/HDL Ratio: 3.3 RATIO
Triglycerides: 74 mg/dL (ref <150)
VLDL: 15 mg/dL (ref 0–40)

## 2015-07-05 LAB — URIC ACID: URIC ACID, SERUM: 4.9 mg/dL (ref 4.4–7.6)

## 2015-07-05 LAB — PSA: PSA: 0.86 ng/mL (ref 0.00–4.00)

## 2015-07-06 DIAGNOSIS — N2581 Secondary hyperparathyroidism of renal origin: Secondary | ICD-10-CM | POA: Diagnosis not present

## 2015-07-06 DIAGNOSIS — Z992 Dependence on renal dialysis: Secondary | ICD-10-CM | POA: Diagnosis not present

## 2015-07-06 DIAGNOSIS — R112 Nausea with vomiting, unspecified: Secondary | ICD-10-CM | POA: Diagnosis not present

## 2015-07-06 DIAGNOSIS — D509 Iron deficiency anemia, unspecified: Secondary | ICD-10-CM | POA: Diagnosis not present

## 2015-07-06 DIAGNOSIS — N186 End stage renal disease: Secondary | ICD-10-CM | POA: Diagnosis not present

## 2015-07-06 LAB — VITAMIN D 25 HYDROXY (VIT D DEFICIENCY, FRACTURES): VIT D 25 HYDROXY: 15.1 ng/mL — AB (ref 30.0–100.0)

## 2015-07-06 LAB — HEMOGLOBIN A1C
HEMOGLOBIN A1C: 5.3 % (ref 4.8–5.6)
MEAN PLASMA GLUCOSE: 105 mg/dL

## 2015-07-09 DIAGNOSIS — D509 Iron deficiency anemia, unspecified: Secondary | ICD-10-CM | POA: Diagnosis not present

## 2015-07-09 DIAGNOSIS — N2581 Secondary hyperparathyroidism of renal origin: Secondary | ICD-10-CM | POA: Diagnosis not present

## 2015-07-09 DIAGNOSIS — N186 End stage renal disease: Secondary | ICD-10-CM | POA: Diagnosis not present

## 2015-07-09 DIAGNOSIS — Z992 Dependence on renal dialysis: Secondary | ICD-10-CM | POA: Diagnosis not present

## 2015-07-09 DIAGNOSIS — R112 Nausea with vomiting, unspecified: Secondary | ICD-10-CM | POA: Diagnosis not present

## 2015-07-11 DIAGNOSIS — N2581 Secondary hyperparathyroidism of renal origin: Secondary | ICD-10-CM | POA: Diagnosis not present

## 2015-07-11 DIAGNOSIS — Z992 Dependence on renal dialysis: Secondary | ICD-10-CM | POA: Diagnosis not present

## 2015-07-11 DIAGNOSIS — R112 Nausea with vomiting, unspecified: Secondary | ICD-10-CM | POA: Diagnosis not present

## 2015-07-11 DIAGNOSIS — D509 Iron deficiency anemia, unspecified: Secondary | ICD-10-CM | POA: Diagnosis not present

## 2015-07-11 DIAGNOSIS — N186 End stage renal disease: Secondary | ICD-10-CM | POA: Diagnosis not present

## 2015-07-13 DIAGNOSIS — N2581 Secondary hyperparathyroidism of renal origin: Secondary | ICD-10-CM | POA: Diagnosis not present

## 2015-07-13 DIAGNOSIS — Z992 Dependence on renal dialysis: Secondary | ICD-10-CM | POA: Diagnosis not present

## 2015-07-13 DIAGNOSIS — D509 Iron deficiency anemia, unspecified: Secondary | ICD-10-CM | POA: Diagnosis not present

## 2015-07-13 DIAGNOSIS — R112 Nausea with vomiting, unspecified: Secondary | ICD-10-CM | POA: Diagnosis not present

## 2015-07-13 DIAGNOSIS — N186 End stage renal disease: Secondary | ICD-10-CM | POA: Diagnosis not present

## 2015-07-16 DIAGNOSIS — N186 End stage renal disease: Secondary | ICD-10-CM | POA: Diagnosis not present

## 2015-07-16 DIAGNOSIS — Z992 Dependence on renal dialysis: Secondary | ICD-10-CM | POA: Diagnosis not present

## 2015-07-16 DIAGNOSIS — N2581 Secondary hyperparathyroidism of renal origin: Secondary | ICD-10-CM | POA: Diagnosis not present

## 2015-07-16 DIAGNOSIS — R112 Nausea with vomiting, unspecified: Secondary | ICD-10-CM | POA: Diagnosis not present

## 2015-07-16 DIAGNOSIS — D509 Iron deficiency anemia, unspecified: Secondary | ICD-10-CM | POA: Diagnosis not present

## 2015-07-18 DIAGNOSIS — R112 Nausea with vomiting, unspecified: Secondary | ICD-10-CM | POA: Diagnosis not present

## 2015-07-18 DIAGNOSIS — D509 Iron deficiency anemia, unspecified: Secondary | ICD-10-CM | POA: Diagnosis not present

## 2015-07-18 DIAGNOSIS — Z992 Dependence on renal dialysis: Secondary | ICD-10-CM | POA: Diagnosis not present

## 2015-07-18 DIAGNOSIS — N2581 Secondary hyperparathyroidism of renal origin: Secondary | ICD-10-CM | POA: Diagnosis not present

## 2015-07-18 DIAGNOSIS — N186 End stage renal disease: Secondary | ICD-10-CM | POA: Diagnosis not present

## 2015-07-20 DIAGNOSIS — R112 Nausea with vomiting, unspecified: Secondary | ICD-10-CM | POA: Diagnosis not present

## 2015-07-20 DIAGNOSIS — N186 End stage renal disease: Secondary | ICD-10-CM | POA: Diagnosis not present

## 2015-07-20 DIAGNOSIS — N2581 Secondary hyperparathyroidism of renal origin: Secondary | ICD-10-CM | POA: Diagnosis not present

## 2015-07-20 DIAGNOSIS — D509 Iron deficiency anemia, unspecified: Secondary | ICD-10-CM | POA: Diagnosis not present

## 2015-07-20 DIAGNOSIS — Z992 Dependence on renal dialysis: Secondary | ICD-10-CM | POA: Diagnosis not present

## 2015-07-23 DIAGNOSIS — N186 End stage renal disease: Secondary | ICD-10-CM | POA: Diagnosis not present

## 2015-07-23 DIAGNOSIS — R112 Nausea with vomiting, unspecified: Secondary | ICD-10-CM | POA: Diagnosis not present

## 2015-07-23 DIAGNOSIS — N2581 Secondary hyperparathyroidism of renal origin: Secondary | ICD-10-CM | POA: Diagnosis not present

## 2015-07-23 DIAGNOSIS — Z992 Dependence on renal dialysis: Secondary | ICD-10-CM | POA: Diagnosis not present

## 2015-07-23 DIAGNOSIS — D509 Iron deficiency anemia, unspecified: Secondary | ICD-10-CM | POA: Diagnosis not present

## 2015-07-25 DIAGNOSIS — R112 Nausea with vomiting, unspecified: Secondary | ICD-10-CM | POA: Diagnosis not present

## 2015-07-25 DIAGNOSIS — D509 Iron deficiency anemia, unspecified: Secondary | ICD-10-CM | POA: Diagnosis not present

## 2015-07-25 DIAGNOSIS — N186 End stage renal disease: Secondary | ICD-10-CM | POA: Diagnosis not present

## 2015-07-25 DIAGNOSIS — Z992 Dependence on renal dialysis: Secondary | ICD-10-CM | POA: Diagnosis not present

## 2015-07-25 DIAGNOSIS — N2581 Secondary hyperparathyroidism of renal origin: Secondary | ICD-10-CM | POA: Diagnosis not present

## 2015-07-27 DIAGNOSIS — N186 End stage renal disease: Secondary | ICD-10-CM | POA: Diagnosis not present

## 2015-07-27 DIAGNOSIS — N2581 Secondary hyperparathyroidism of renal origin: Secondary | ICD-10-CM | POA: Diagnosis not present

## 2015-07-27 DIAGNOSIS — Z992 Dependence on renal dialysis: Secondary | ICD-10-CM | POA: Diagnosis not present

## 2015-07-27 DIAGNOSIS — R112 Nausea with vomiting, unspecified: Secondary | ICD-10-CM | POA: Diagnosis not present

## 2015-07-27 DIAGNOSIS — D509 Iron deficiency anemia, unspecified: Secondary | ICD-10-CM | POA: Diagnosis not present

## 2015-07-30 DIAGNOSIS — R112 Nausea with vomiting, unspecified: Secondary | ICD-10-CM | POA: Diagnosis not present

## 2015-07-30 DIAGNOSIS — N186 End stage renal disease: Secondary | ICD-10-CM | POA: Diagnosis not present

## 2015-07-30 DIAGNOSIS — Z992 Dependence on renal dialysis: Secondary | ICD-10-CM | POA: Diagnosis not present

## 2015-07-30 DIAGNOSIS — D509 Iron deficiency anemia, unspecified: Secondary | ICD-10-CM | POA: Diagnosis not present

## 2015-07-30 DIAGNOSIS — N2581 Secondary hyperparathyroidism of renal origin: Secondary | ICD-10-CM | POA: Diagnosis not present

## 2015-08-01 DIAGNOSIS — D509 Iron deficiency anemia, unspecified: Secondary | ICD-10-CM | POA: Diagnosis not present

## 2015-08-01 DIAGNOSIS — R112 Nausea with vomiting, unspecified: Secondary | ICD-10-CM | POA: Diagnosis not present

## 2015-08-01 DIAGNOSIS — N186 End stage renal disease: Secondary | ICD-10-CM | POA: Diagnosis not present

## 2015-08-01 DIAGNOSIS — N2581 Secondary hyperparathyroidism of renal origin: Secondary | ICD-10-CM | POA: Diagnosis not present

## 2015-08-01 DIAGNOSIS — Z992 Dependence on renal dialysis: Secondary | ICD-10-CM | POA: Diagnosis not present

## 2015-08-03 DIAGNOSIS — R112 Nausea with vomiting, unspecified: Secondary | ICD-10-CM | POA: Diagnosis not present

## 2015-08-03 DIAGNOSIS — D509 Iron deficiency anemia, unspecified: Secondary | ICD-10-CM | POA: Diagnosis not present

## 2015-08-03 DIAGNOSIS — N2581 Secondary hyperparathyroidism of renal origin: Secondary | ICD-10-CM | POA: Diagnosis not present

## 2015-08-03 DIAGNOSIS — N186 End stage renal disease: Secondary | ICD-10-CM | POA: Diagnosis not present

## 2015-08-03 DIAGNOSIS — Z992 Dependence on renal dialysis: Secondary | ICD-10-CM | POA: Diagnosis not present

## 2015-08-06 DIAGNOSIS — R112 Nausea with vomiting, unspecified: Secondary | ICD-10-CM | POA: Diagnosis not present

## 2015-08-06 DIAGNOSIS — Z992 Dependence on renal dialysis: Secondary | ICD-10-CM | POA: Diagnosis not present

## 2015-08-06 DIAGNOSIS — E162 Hypoglycemia, unspecified: Secondary | ICD-10-CM | POA: Diagnosis not present

## 2015-08-06 DIAGNOSIS — E11649 Type 2 diabetes mellitus with hypoglycemia without coma: Secondary | ICD-10-CM | POA: Diagnosis not present

## 2015-08-06 DIAGNOSIS — N2581 Secondary hyperparathyroidism of renal origin: Secondary | ICD-10-CM | POA: Diagnosis not present

## 2015-08-06 DIAGNOSIS — N186 End stage renal disease: Secondary | ICD-10-CM | POA: Diagnosis not present

## 2015-08-06 DIAGNOSIS — D509 Iron deficiency anemia, unspecified: Secondary | ICD-10-CM | POA: Diagnosis not present

## 2015-08-08 DIAGNOSIS — E162 Hypoglycemia, unspecified: Secondary | ICD-10-CM | POA: Diagnosis not present

## 2015-08-08 DIAGNOSIS — N2581 Secondary hyperparathyroidism of renal origin: Secondary | ICD-10-CM | POA: Diagnosis not present

## 2015-08-08 DIAGNOSIS — Z992 Dependence on renal dialysis: Secondary | ICD-10-CM | POA: Diagnosis not present

## 2015-08-08 DIAGNOSIS — R112 Nausea with vomiting, unspecified: Secondary | ICD-10-CM | POA: Diagnosis not present

## 2015-08-08 DIAGNOSIS — N186 End stage renal disease: Secondary | ICD-10-CM | POA: Diagnosis not present

## 2015-08-08 DIAGNOSIS — D509 Iron deficiency anemia, unspecified: Secondary | ICD-10-CM | POA: Diagnosis not present

## 2015-08-10 DIAGNOSIS — N2581 Secondary hyperparathyroidism of renal origin: Secondary | ICD-10-CM | POA: Diagnosis not present

## 2015-08-10 DIAGNOSIS — Z992 Dependence on renal dialysis: Secondary | ICD-10-CM | POA: Diagnosis not present

## 2015-08-10 DIAGNOSIS — D509 Iron deficiency anemia, unspecified: Secondary | ICD-10-CM | POA: Diagnosis not present

## 2015-08-10 DIAGNOSIS — N186 End stage renal disease: Secondary | ICD-10-CM | POA: Diagnosis not present

## 2015-08-10 DIAGNOSIS — E162 Hypoglycemia, unspecified: Secondary | ICD-10-CM | POA: Diagnosis not present

## 2015-08-10 DIAGNOSIS — R112 Nausea with vomiting, unspecified: Secondary | ICD-10-CM | POA: Diagnosis not present

## 2015-08-13 DIAGNOSIS — E119 Type 2 diabetes mellitus without complications: Secondary | ICD-10-CM | POA: Diagnosis not present

## 2015-08-13 DIAGNOSIS — D509 Iron deficiency anemia, unspecified: Secondary | ICD-10-CM | POA: Diagnosis not present

## 2015-08-13 DIAGNOSIS — N186 End stage renal disease: Secondary | ICD-10-CM | POA: Diagnosis not present

## 2015-08-13 DIAGNOSIS — N2581 Secondary hyperparathyroidism of renal origin: Secondary | ICD-10-CM | POA: Diagnosis not present

## 2015-08-13 DIAGNOSIS — E162 Hypoglycemia, unspecified: Secondary | ICD-10-CM | POA: Diagnosis not present

## 2015-08-13 DIAGNOSIS — Z992 Dependence on renal dialysis: Secondary | ICD-10-CM | POA: Diagnosis not present

## 2015-08-13 DIAGNOSIS — R112 Nausea with vomiting, unspecified: Secondary | ICD-10-CM | POA: Diagnosis not present

## 2015-08-15 ENCOUNTER — Emergency Department (HOSPITAL_COMMUNITY)
Admission: EM | Admit: 2015-08-15 | Discharge: 2015-08-15 | Disposition: A | Discharge status: Home / Self Care | Payer: Medicare Other | Attending: Emergency Medicine | Admitting: Emergency Medicine

## 2015-08-15 ENCOUNTER — Encounter (HOSPITAL_COMMUNITY): Payer: Self-pay

## 2015-08-15 DIAGNOSIS — M109 Gout, unspecified: Secondary | ICD-10-CM | POA: Diagnosis not present

## 2015-08-15 DIAGNOSIS — Q279 Congenital malformation of peripheral vascular system, unspecified: Secondary | ICD-10-CM | POA: Insufficient documentation

## 2015-08-15 DIAGNOSIS — N186 End stage renal disease: Secondary | ICD-10-CM | POA: Diagnosis not present

## 2015-08-15 DIAGNOSIS — N184 Chronic kidney disease, stage 4 (severe): Secondary | ICD-10-CM | POA: Insufficient documentation

## 2015-08-15 DIAGNOSIS — N2581 Secondary hyperparathyroidism of renal origin: Secondary | ICD-10-CM | POA: Diagnosis not present

## 2015-08-15 DIAGNOSIS — E119 Type 2 diabetes mellitus without complications: Secondary | ICD-10-CM | POA: Insufficient documentation

## 2015-08-15 DIAGNOSIS — L02414 Cutaneous abscess of left upper limb: Secondary | ICD-10-CM | POA: Diagnosis present

## 2015-08-15 DIAGNOSIS — M1990 Unspecified osteoarthritis, unspecified site: Secondary | ICD-10-CM | POA: Diagnosis not present

## 2015-08-15 DIAGNOSIS — R112 Nausea with vomiting, unspecified: Secondary | ICD-10-CM | POA: Diagnosis not present

## 2015-08-15 DIAGNOSIS — M549 Dorsalgia, unspecified: Secondary | ICD-10-CM | POA: Insufficient documentation

## 2015-08-15 DIAGNOSIS — D509 Iron deficiency anemia, unspecified: Secondary | ICD-10-CM | POA: Diagnosis not present

## 2015-08-15 DIAGNOSIS — K5900 Constipation, unspecified: Secondary | ICD-10-CM | POA: Insufficient documentation

## 2015-08-15 DIAGNOSIS — I129 Hypertensive chronic kidney disease with stage 1 through stage 4 chronic kidney disease, or unspecified chronic kidney disease: Secondary | ICD-10-CM | POA: Diagnosis not present

## 2015-08-15 DIAGNOSIS — M1000 Idiopathic gout, unspecified site: Secondary | ICD-10-CM

## 2015-08-15 DIAGNOSIS — M1009 Idiopathic gout, multiple sites: Secondary | ICD-10-CM | POA: Diagnosis not present

## 2015-08-15 DIAGNOSIS — Z79899 Other long term (current) drug therapy: Secondary | ICD-10-CM | POA: Insufficient documentation

## 2015-08-15 DIAGNOSIS — Z992 Dependence on renal dialysis: Secondary | ICD-10-CM | POA: Diagnosis not present

## 2015-08-15 DIAGNOSIS — E162 Hypoglycemia, unspecified: Secondary | ICD-10-CM | POA: Diagnosis not present

## 2015-08-15 MED ORDER — COLCHICINE 0.6 MG PO TABS: ORAL_TABLET | ORAL | Status: DC

## 2015-08-15 NOTE — ED Notes (Signed)
C/o "bump" under left armpit

## 2015-08-15 NOTE — Discharge Instructions (Signed)
The knot you are feeling is probably related to a small aneurysm associated with your dialysis cath. Please see your vascular surgeon who put the cath in for your as soon as possible.

## 2015-08-15 NOTE — ED Provider Notes (Signed)
CSN: [id number]     Arrival date & time 08/15/15  1533 History   First MD Initiated Contact with Patient 08/15/15 1610     Chief Complaint  Patient presents with  . Abscess     (Consider location/radiation/quality/duration/timing/severity/associated sxs/prior Treatment) HPI Comments: Pt states he noted a knot in the left bicep area. This is close to his dialysis cath and he is concerned about it. He ask the dialysis nurse to examine it, but she referred him to the ED because she could not give him a firm diagnosis. No fever, no chills. No swelling of the arm. No loss of power or control of the left upper extremity.  The history is provided by the patient.    Past Medical History  Diagnosis Date  . Hypertension   . Diabetes mellitus   . Gout   . Chronic back pain   . BPH (benign prostatic hyperplasia)   . Anemia   . DVT (deep venous thrombosis) ([place])   . Neuropathy ([place])   . Decubitus ulcer     of 2nd toes of both feet.  . Edema leg   . Constipation   . Venous (peripheral) insufficiency   . Neuropathy, diabetic ([place])   . Physical deconditioning   . Poor balance   . Difficulty walking   . Lack of coordination   . Arthritis   . Renal insufficiency   . Chronic kidney disease   . Chronic kidney disease (CKD), stage IV (severe) ([place])   . Constipation    Past Surgical History  Procedure Laterality Date  . None    . Insertion of dialysis catheter    . Cyst excision Right     cyst removed on thumb 2001  . Insertion of dialysis catheter Left 03/09/2014    Procedure: INSERTION OF DIALYSIS CATHETER;  Surgeon: Angelia Mould, MD;  Location: [facility];  Service: Vascular;  Laterality: Left;  . Av fistula placement Left 03/09/2014    Procedure: INSERTION OF ARTERIOVENOUS (AV) GORE-TEX GRAFT ARM;  Surgeon: Angelia Mould, MD;  Location: [facility] OR;  Service: Vascular;  Laterality: Left;   Family History  Problem Relation Age of Onset  . Diabetes Mother   . Hypertension Mother    . Heart failure Mother   . Hyperlipidemia Mother   . Heart attack Mother   . Cancer Father   . Diabetes Father   . Hypertension Father   . Hyperlipidemia Father    Social History  Substance Use Topics  . Smoking status: Never Smoker   . Smokeless tobacco: Never Used  . Alcohol Use: No    Review of Systems  Gastrointestinal: Positive for constipation.  Musculoskeletal: Positive for back pain and arthralgias.  All other systems reviewed and are negative.     Allergies  Review of patient's allergies indicates no known allergies.  Home Medications   Prior to Admission medications   Medication Sig Start Date End Date Taking? Authorizing Provider  calcium acetate (PHOSLO) 667 MG capsule Take 1 capsule by mouth 3 (three) times daily. 04/11/14  Yes Historical Provider, MD  colchicine 0.6 MG tablet Take 0.5 tablets (0.3 mg total) by mouth 2 (two) times a week. Take 0.6mg  (one tablet) by mouth every 1-2 hours until one of the following occurs: 1.  The pain is gone 2.  The maximum dose has been given ( no more than 3 tabs in 3 hours or 10 tabs in 24 hours) 3.  The side effects outweight the benefits  04/12/14  Yes Noemi Chapel, MD  gabapentin (NEURONTIN) 100 MG capsule Take 100 mg by mouth daily.  07/11/14  Yes Historical Provider, MD  multivitamin (RENA-VIT) TABS tablet Take 1 tablet by mouth daily.   Yes Historical Provider, MD  RENVELA 800 MG tablet Take 800 mg by mouth 3 (three) times daily with meals.  07/18/14  Yes Historical Provider, MD  sildenafil (VIAGRA) 50 MG tablet Take 50 mg by mouth daily as needed for erectile dysfunction.   Yes Historical Provider, MD  Skin Protectants, Misc. (EUCERIN) cream Apply 1 application topically 2 (two) times daily.   Yes Historical Provider, MD  triamcinolone cream (KENALOG) 0.1 % Apply 1 application topically 2 (two) times daily.   Yes Historical Provider, MD  ULORIC 80 MG TABS Take 80 mg by mouth daily. 09/26/14  Yes Historical Provider, MD   diclofenac sodium (VOLTAREN) 1 % GEL Apply to left wrist and hand three times daily Patient not taking: Reported on 08/15/2015 04/13/14   Lily Kocher, PA-C   BP 129/75 mmHg  Pulse 98  Temp(Src) 98.3 F (36.8 C) (Oral)  Resp 18  Ht 5\' 11"  (1.803 m)  Wt 119.296 kg  BMI 36.70 kg/m2  SpO2 97% Physical Exam  Constitutional: He is oriented to person, place, and time. He appears well-developed and well-nourished.  Non-toxic appearance.  HENT:  Head: Normocephalic.  Right Ear: Tympanic membrane and external ear normal.  Left Ear: Tympanic membrane and external ear normal.  Eyes: EOM and lids are normal. Pupils are equal, round, and reactive to light.  Neck: Normal range of motion. Neck supple. Carotid bruit is not present.  Cardiovascular: Normal rate, regular rhythm, intact distal pulses and normal pulses.   Murmur heard. Pulmonary/Chest: Breath sounds normal. No respiratory distress.  Abdominal: Soft. Bowel sounds are normal. There is no tenderness. There is no guarding.  Musculoskeletal: Normal range of motion.  The dialysis cath is in the left upper arm. Pt has a small aneurysm of in the brachial area. No swelling of the arm. FROM of the left shoulder, elbow, and wrist. Pain with ROM of the fingers and wrist of the right greater than left. Pt states he is having a gout flare.  Lymphadenopathy:       Head (right side): No submandibular adenopathy present.       Head (left side): No submandibular adenopathy present.    He has no cervical adenopathy.  Neurological: He is alert and oriented to person, place, and time. He has normal strength. No cranial nerve deficit or sensory deficit.  Skin: Skin is warm and dry.  Psychiatric: He has a normal mood and affect. His speech is normal.  Nursing note and vitals reviewed.   ED Course  Procedures (including critical care time) Labs Review Labs Reviewed - No data to display  Imaging Review No results found. I have personally reviewed and  evaluated these images and lab results as part of my medical decision-making.   EKG Interpretation None      MDM The knot the patient c/o is related to his dialysis cath. I have ask him to see the vascular surgeon concerning this. No evidence for infection or leak at this time.   Final diagnoses:  None    **I have reviewed nursing notes, vital signs, and all appropriate lab and imaging results for this patient.Lily Kocher, PA-C 08/15/15 [place], MD 08/15/15 [phone]

## 2015-08-16 DIAGNOSIS — M1990 Unspecified osteoarthritis, unspecified site: Secondary | ICD-10-CM | POA: Diagnosis not present

## 2015-08-16 DIAGNOSIS — I10 Essential (primary) hypertension: Secondary | ICD-10-CM | POA: Diagnosis not present

## 2015-08-16 DIAGNOSIS — E119 Type 2 diabetes mellitus without complications: Secondary | ICD-10-CM | POA: Diagnosis not present

## 2015-08-16 DIAGNOSIS — N186 End stage renal disease: Secondary | ICD-10-CM | POA: Diagnosis not present

## 2015-08-16 DIAGNOSIS — M79641 Pain in right hand: Secondary | ICD-10-CM | POA: Diagnosis not present

## 2015-08-16 DIAGNOSIS — Z992 Dependence on renal dialysis: Secondary | ICD-10-CM | POA: Diagnosis not present

## 2015-08-16 DIAGNOSIS — I120 Hypertensive chronic kidney disease with stage 5 chronic kidney disease or end stage renal disease: Secondary | ICD-10-CM | POA: Diagnosis not present

## 2015-08-17 DIAGNOSIS — N186 End stage renal disease: Secondary | ICD-10-CM | POA: Diagnosis not present

## 2015-08-17 DIAGNOSIS — Z992 Dependence on renal dialysis: Secondary | ICD-10-CM | POA: Diagnosis not present

## 2015-08-17 DIAGNOSIS — R112 Nausea with vomiting, unspecified: Secondary | ICD-10-CM | POA: Diagnosis not present

## 2015-08-17 DIAGNOSIS — D509 Iron deficiency anemia, unspecified: Secondary | ICD-10-CM | POA: Diagnosis not present

## 2015-08-17 DIAGNOSIS — N2581 Secondary hyperparathyroidism of renal origin: Secondary | ICD-10-CM | POA: Diagnosis not present

## 2015-08-17 DIAGNOSIS — E162 Hypoglycemia, unspecified: Secondary | ICD-10-CM | POA: Diagnosis not present

## 2015-08-20 IMAGING — CR DG ABDOMEN ACUTE W/ 1V CHEST
4 series · 4 of 4 positions shown · non-contrast
Comparison: Chest radiographs 06/25/2013 and abdominal series
03/28/2013

CLINICAL DATA: Generalized abdominal pain and nausea with vomiting
since this morning.

EXAM:
ACUTE ABDOMEN SERIES (ABDOMEN 2 VIEW & CHEST 1 VIEW)

[view not recorded (1 of 4)]
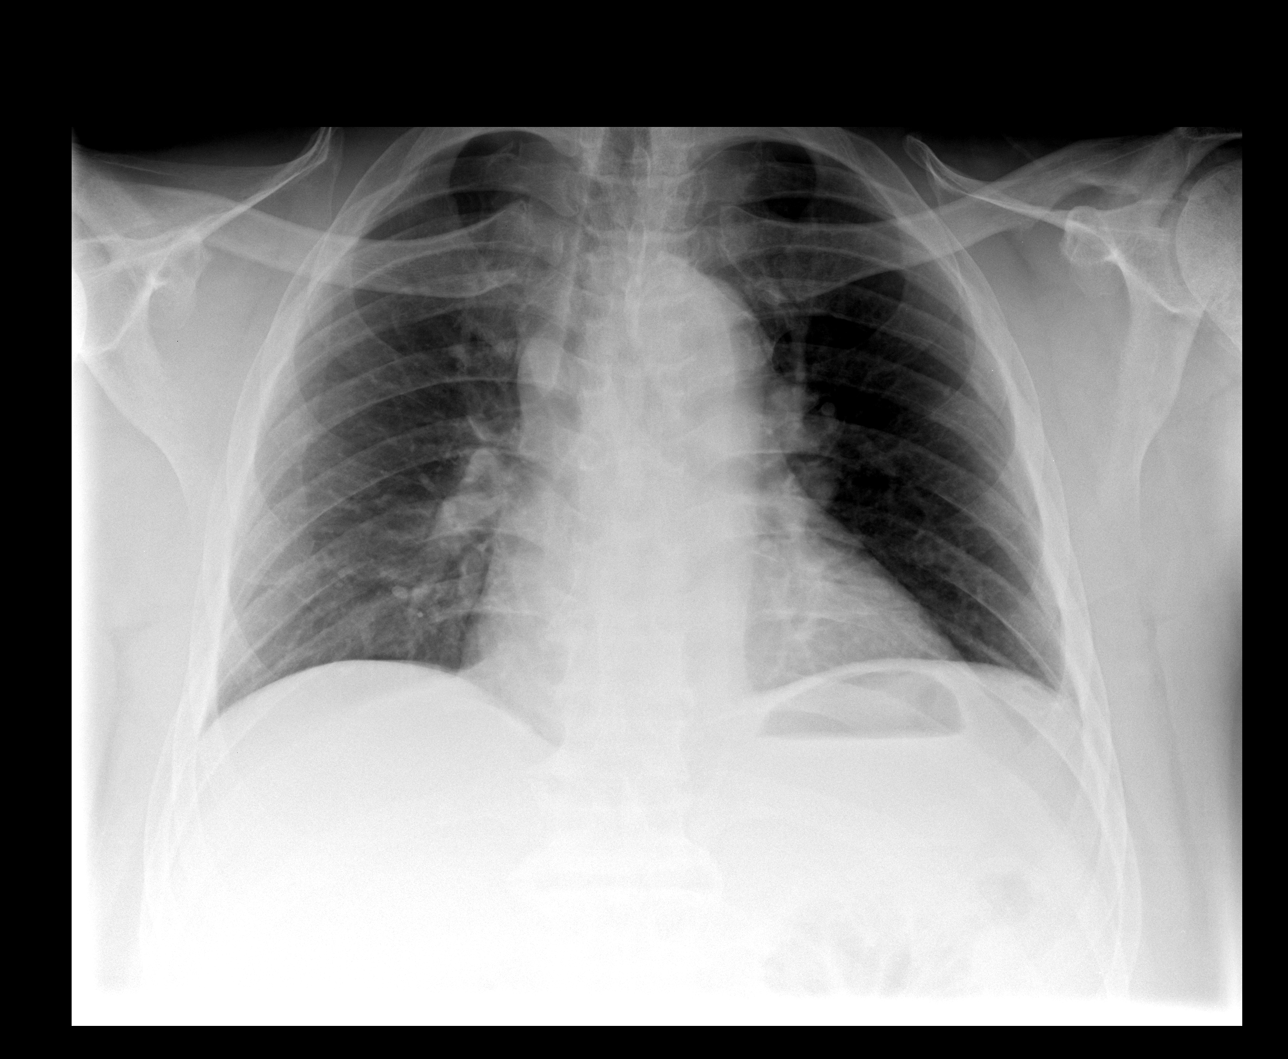

[view not recorded (2 of 4)]
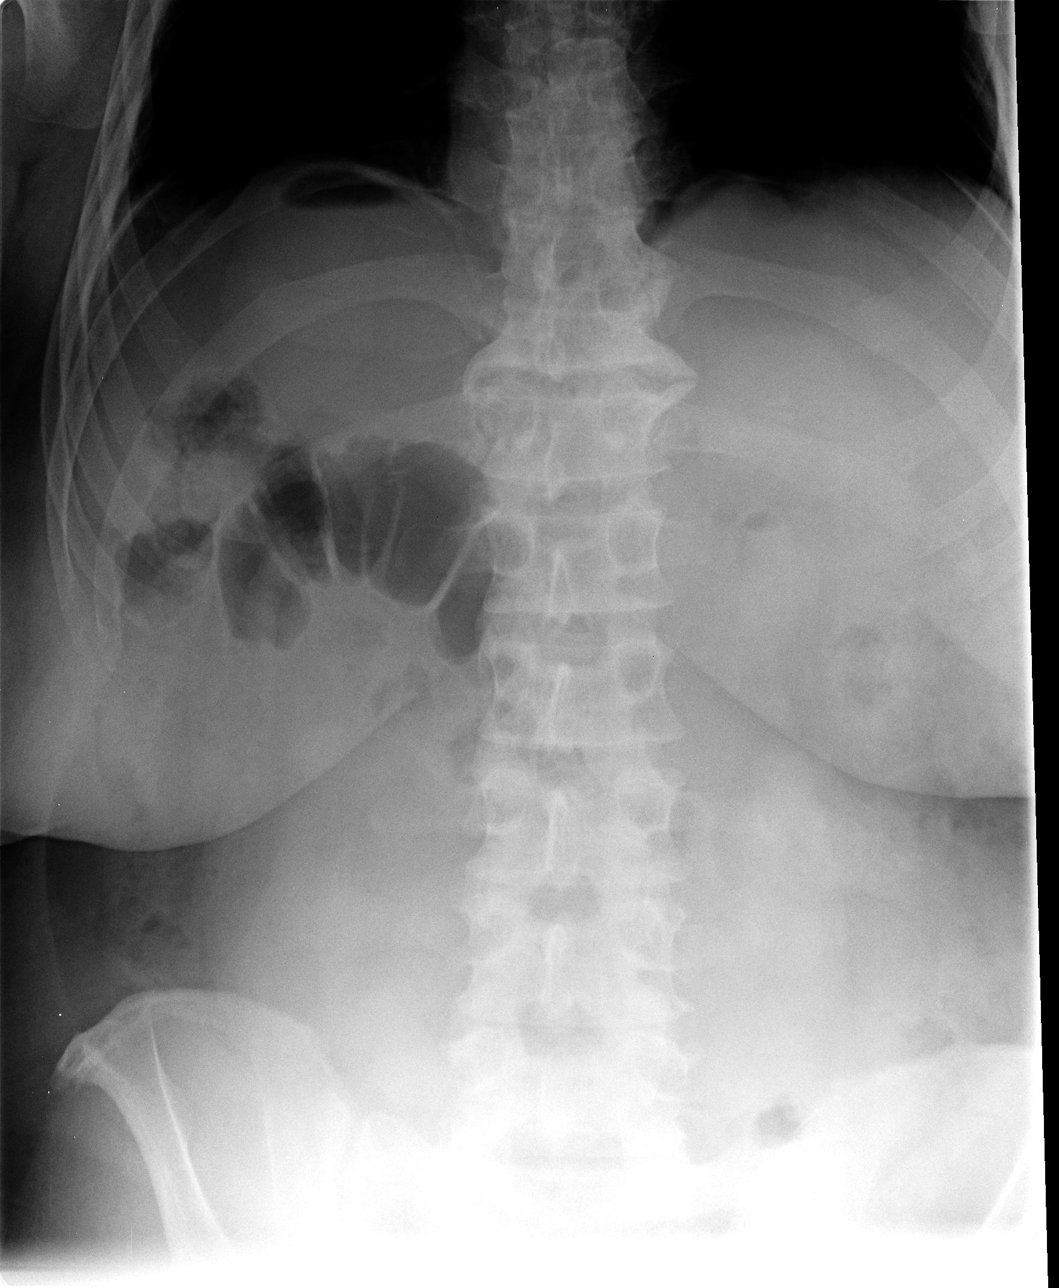

[view not recorded (3 of 4)]
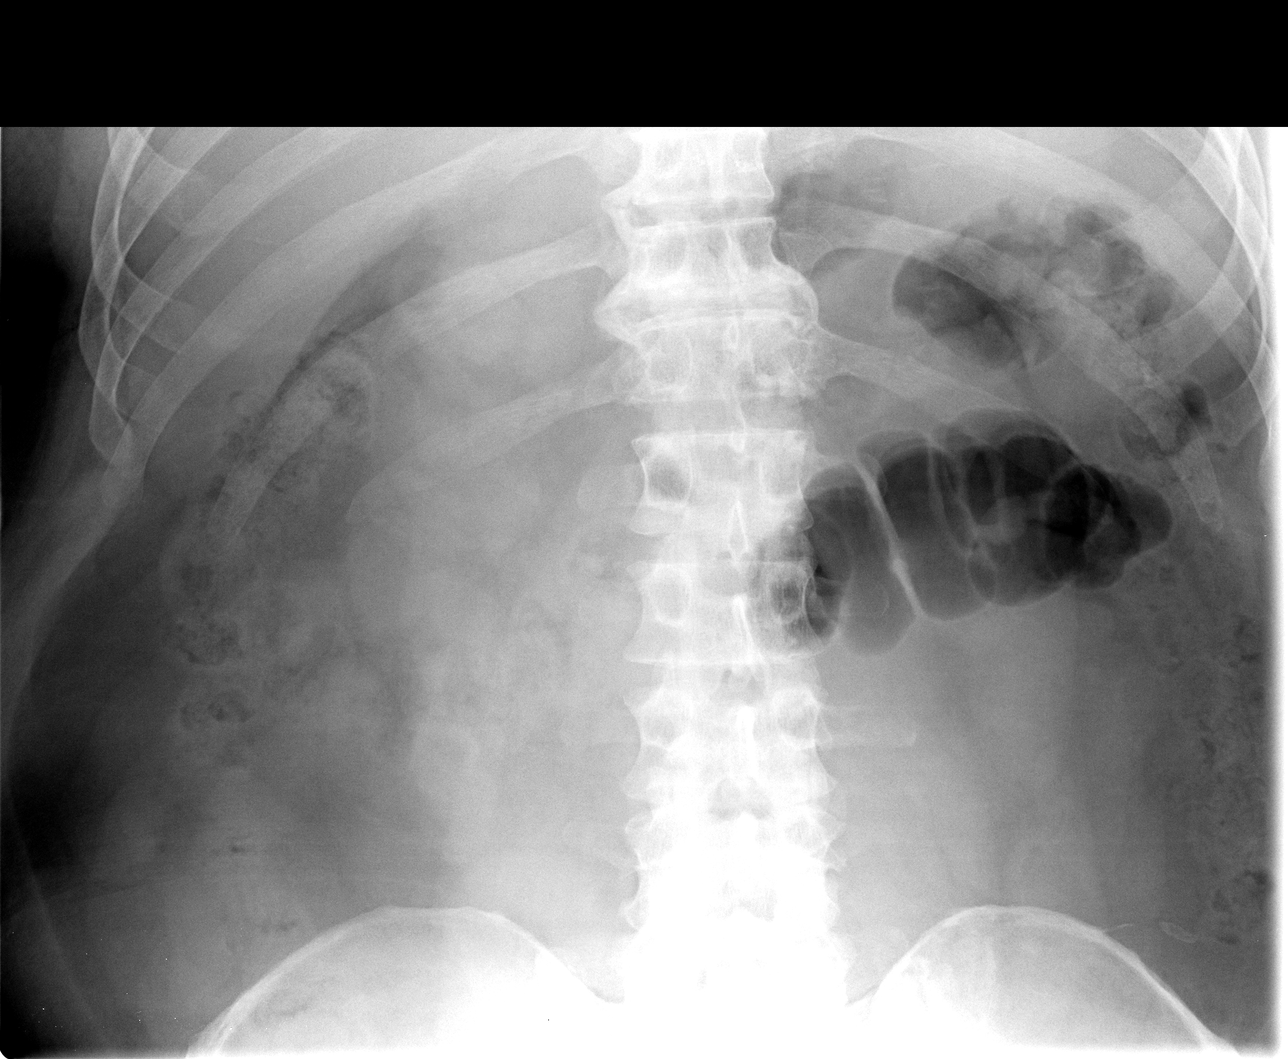

[view not recorded (4 of 4)]
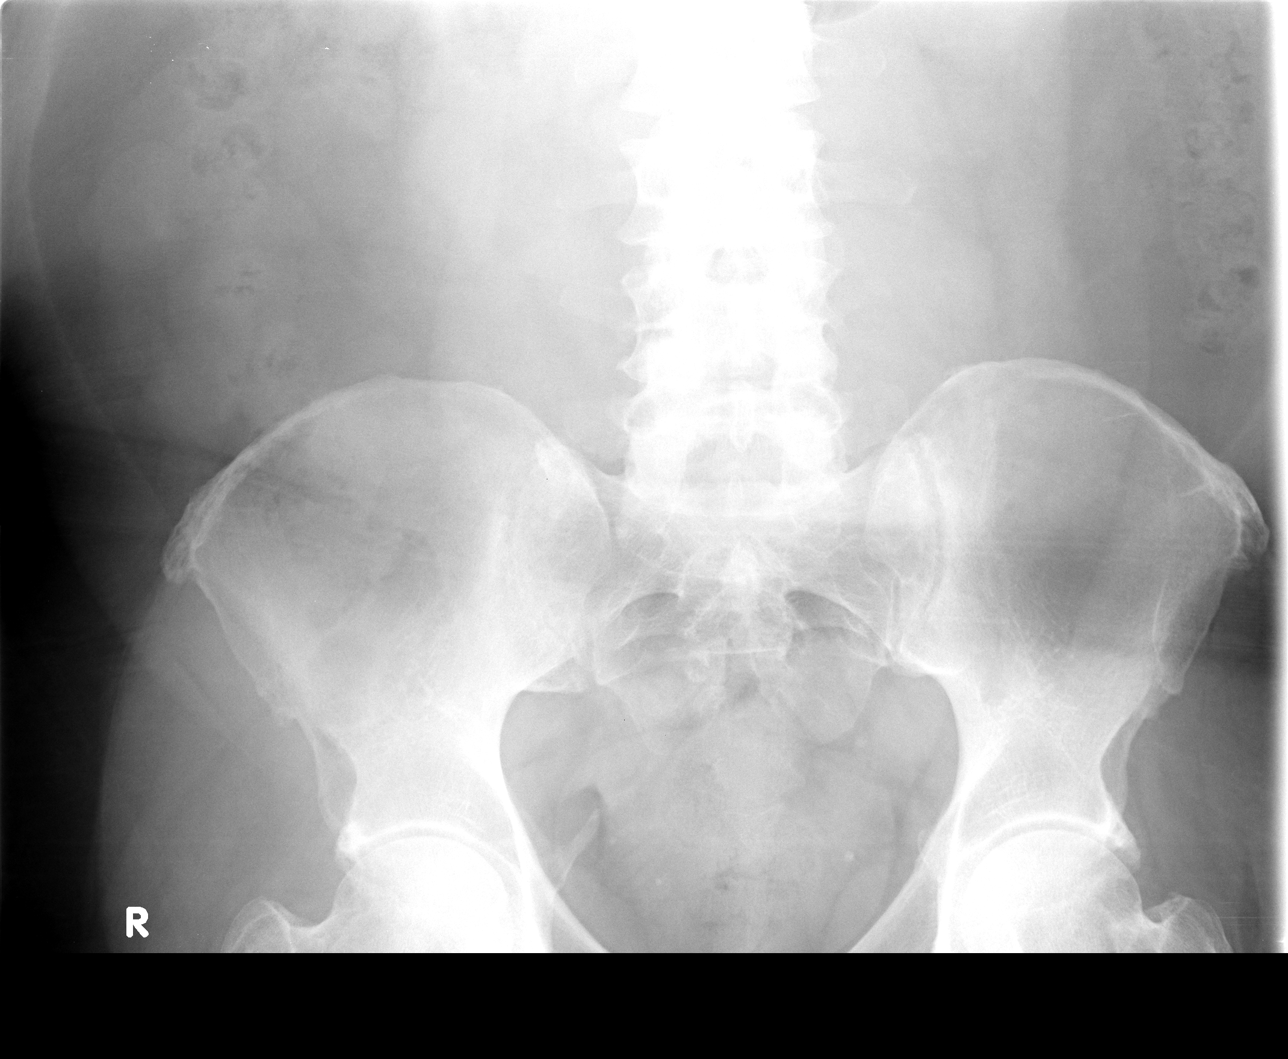

[4 of 4 positions shown; findings below may reference images not displayed]

FINDINGS: Cardiac silhouette is mildly accentuated by hypoinflation but does
not appear significantly changed from the prior study and is likely
upper limits of normal limits in size. No airspace consolidation,
edema, pleural effusion, or pneumothorax is identified.

There is no evidence of intraperitoneal free air. Gas and stool are
present throughout nondilated colon. A small amount of gas is seen
in a few scattered loops of grossly nondilated small bowel, although
evaluation for obstruction is limited by the overall paucity of
small bowel. Small calcifications in the pelvis likely represent
phleboliths. Thoracolumbar osteophytosis is noted.
IMPRESSION: 1. No evidence of acute airspace disease.
2. No definite evidence of bowel obstruction, however evaluation is
somewhat limited by a paucity of small bowel gas.

## 2015-08-21 DIAGNOSIS — R112 Nausea with vomiting, unspecified: Secondary | ICD-10-CM | POA: Diagnosis not present

## 2015-08-21 DIAGNOSIS — D631 Anemia in chronic kidney disease: Secondary | ICD-10-CM | POA: Diagnosis not present

## 2015-08-21 DIAGNOSIS — D509 Iron deficiency anemia, unspecified: Secondary | ICD-10-CM | POA: Diagnosis not present

## 2015-08-21 DIAGNOSIS — N186 End stage renal disease: Secondary | ICD-10-CM | POA: Diagnosis not present

## 2015-08-21 DIAGNOSIS — N2581 Secondary hyperparathyroidism of renal origin: Secondary | ICD-10-CM | POA: Diagnosis not present

## 2015-08-22 IMAGING — US IR US GUIDE VASC ACCESS RIGHT
1 series · 1 of 1 positions shown · non-contrast
Comparison: none

INDICATION: 48-year-old with end-stage renal disease. Patient needs a tunneled
dialysis catheter for hemodialysis. Patient currently has a non
tunneled right groin catheter.

[Series 1: ir fluoro guide cv line*right* · 1 of 1 slices shown]
[im 1/1]
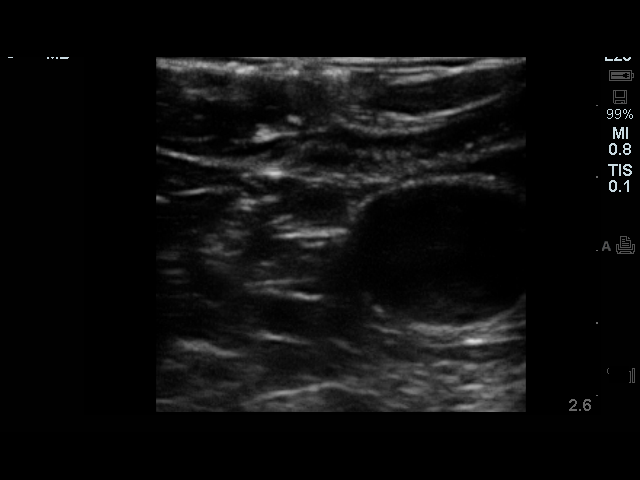

[1 of 1 positions shown; findings below may reference images not displayed]

EXAM:
FLUOROSCOPIC AND ULTRASOUND GUIDED PLACEMENT OF A TUNNELED DIALYSIS
CATHETER

FLUOROSCOPY TIME:  6 seconds

MEDICATIONS:
1 mg versed, 50 mcg fentanyl. A radiology nurse monitored the
patient for moderate sedation. As antibiotic prophylaxis, Ancef was
ordered pre-procedure and administered intravenously within one hour
of incision.

ANESTHESIA/SEDATION:
Moderate sedation time: 28 minutes

PROCEDURE:
Informed consent was obtained for placement of a tunneled dialysis
catheter. Specifically, the risks of bleeding and infection were
discussed with the patient. The patient was placed supine on the
interventional table. Ultrasound confirmed a patent right internal
jugularvein. Ultrasound images were obtained for documentation. The
right side of the neck was prepped and draped in a sterile fashion.
The right side of the neck was anesthetized with 1% lidocaine.
Maximal barrier sterile technique was utilized including caps, mask,
sterile gowns, sterile gloves, sterile drape, hand hygiene and skin
antiseptic. A small incision was made with #11 blade scalpel. A 21
gauge needle directed into the right internal jugular vein with
ultrasound guidance. A micropuncture dilator set was placed. A 23 cm
tip to cuff HemoSplit catheter was selected. The skin below the
right clavicle was anesthetized and a small incision was made with
an #11 blade scalpel. A subcutaneous tunnel was formed to the vein
dermatotomy site. The catheter was brought through the tunnel. The
vein dermatotomy site was dilated to accommodate a peel-away sheath.
The catheter was placed through the peel-away sheath and directed
into the central venous structures. The tip of the catheter was
placed in the right atrium with fluoroscopy. Fluoroscopic images
were obtained for documentation. Both lumens were found to aspirate
and flush well. The proper amount of heparin was flushed in both
lumens. The vein dermatotomy site was closed using a single layer of
absorbable suture and Dermabond. The catheter was secured to the
skin using Prolene suture. Gel-Foam was placed in subcutaneous
tunnel. Bandage placed over the catheter site.
FINDINGS: Catheter tip in the right atrium.

COMPLICATIONS:
None
IMPRESSION: Successful placement of a right jugular tunneled dialysis catheter
using ultrasound and fluoroscopic guidance.

## 2015-08-23 DIAGNOSIS — N186 End stage renal disease: Secondary | ICD-10-CM | POA: Diagnosis not present

## 2015-08-23 DIAGNOSIS — D509 Iron deficiency anemia, unspecified: Secondary | ICD-10-CM | POA: Diagnosis not present

## 2015-08-23 DIAGNOSIS — N2581 Secondary hyperparathyroidism of renal origin: Secondary | ICD-10-CM | POA: Diagnosis not present

## 2015-08-23 DIAGNOSIS — D631 Anemia in chronic kidney disease: Secondary | ICD-10-CM | POA: Diagnosis not present

## 2015-08-25 DIAGNOSIS — N2581 Secondary hyperparathyroidism of renal origin: Secondary | ICD-10-CM | POA: Diagnosis not present

## 2015-08-25 DIAGNOSIS — D631 Anemia in chronic kidney disease: Secondary | ICD-10-CM | POA: Diagnosis not present

## 2015-08-25 DIAGNOSIS — N186 End stage renal disease: Secondary | ICD-10-CM | POA: Diagnosis not present

## 2015-08-25 DIAGNOSIS — D509 Iron deficiency anemia, unspecified: Secondary | ICD-10-CM | POA: Diagnosis not present

## 2015-08-29 DIAGNOSIS — R112 Nausea with vomiting, unspecified: Secondary | ICD-10-CM | POA: Diagnosis not present

## 2015-08-29 DIAGNOSIS — N186 End stage renal disease: Secondary | ICD-10-CM | POA: Diagnosis not present

## 2015-08-29 DIAGNOSIS — D509 Iron deficiency anemia, unspecified: Secondary | ICD-10-CM | POA: Diagnosis not present

## 2015-08-29 DIAGNOSIS — N2581 Secondary hyperparathyroidism of renal origin: Secondary | ICD-10-CM | POA: Diagnosis not present

## 2015-08-29 DIAGNOSIS — E162 Hypoglycemia, unspecified: Secondary | ICD-10-CM | POA: Diagnosis not present

## 2015-08-29 DIAGNOSIS — Z992 Dependence on renal dialysis: Secondary | ICD-10-CM | POA: Diagnosis not present

## 2015-08-31 DIAGNOSIS — D509 Iron deficiency anemia, unspecified: Secondary | ICD-10-CM | POA: Diagnosis not present

## 2015-08-31 DIAGNOSIS — N186 End stage renal disease: Secondary | ICD-10-CM | POA: Diagnosis not present

## 2015-08-31 DIAGNOSIS — R112 Nausea with vomiting, unspecified: Secondary | ICD-10-CM | POA: Diagnosis not present

## 2015-08-31 DIAGNOSIS — N2581 Secondary hyperparathyroidism of renal origin: Secondary | ICD-10-CM | POA: Diagnosis not present

## 2015-08-31 DIAGNOSIS — E162 Hypoglycemia, unspecified: Secondary | ICD-10-CM | POA: Diagnosis not present

## 2015-08-31 DIAGNOSIS — Z992 Dependence on renal dialysis: Secondary | ICD-10-CM | POA: Diagnosis not present

## 2015-09-03 DIAGNOSIS — N2581 Secondary hyperparathyroidism of renal origin: Secondary | ICD-10-CM | POA: Diagnosis not present

## 2015-09-03 DIAGNOSIS — D509 Iron deficiency anemia, unspecified: Secondary | ICD-10-CM | POA: Diagnosis not present

## 2015-09-03 DIAGNOSIS — T82868A Thrombosis of vascular prosthetic devices, implants and grafts, initial encounter: Secondary | ICD-10-CM | POA: Diagnosis not present

## 2015-09-03 DIAGNOSIS — I871 Compression of vein: Secondary | ICD-10-CM | POA: Diagnosis not present

## 2015-09-03 DIAGNOSIS — N186 End stage renal disease: Secondary | ICD-10-CM | POA: Diagnosis not present

## 2015-09-03 DIAGNOSIS — Z992 Dependence on renal dialysis: Secondary | ICD-10-CM | POA: Diagnosis not present

## 2015-09-03 DIAGNOSIS — E162 Hypoglycemia, unspecified: Secondary | ICD-10-CM | POA: Diagnosis not present

## 2015-09-03 DIAGNOSIS — R112 Nausea with vomiting, unspecified: Secondary | ICD-10-CM | POA: Diagnosis not present

## 2015-09-05 DIAGNOSIS — D509 Iron deficiency anemia, unspecified: Secondary | ICD-10-CM | POA: Diagnosis not present

## 2015-09-05 DIAGNOSIS — N186 End stage renal disease: Secondary | ICD-10-CM | POA: Diagnosis not present

## 2015-09-05 DIAGNOSIS — Z992 Dependence on renal dialysis: Secondary | ICD-10-CM | POA: Diagnosis not present

## 2015-09-05 DIAGNOSIS — E162 Hypoglycemia, unspecified: Secondary | ICD-10-CM | POA: Diagnosis not present

## 2015-09-05 DIAGNOSIS — E11649 Type 2 diabetes mellitus with hypoglycemia without coma: Secondary | ICD-10-CM | POA: Diagnosis not present

## 2015-09-05 DIAGNOSIS — N2581 Secondary hyperparathyroidism of renal origin: Secondary | ICD-10-CM | POA: Diagnosis not present

## 2015-09-07 DIAGNOSIS — N186 End stage renal disease: Secondary | ICD-10-CM | POA: Diagnosis not present

## 2015-09-07 DIAGNOSIS — Z992 Dependence on renal dialysis: Secondary | ICD-10-CM | POA: Diagnosis not present

## 2015-09-07 DIAGNOSIS — E11649 Type 2 diabetes mellitus with hypoglycemia without coma: Secondary | ICD-10-CM | POA: Diagnosis not present

## 2015-09-07 DIAGNOSIS — D509 Iron deficiency anemia, unspecified: Secondary | ICD-10-CM | POA: Diagnosis not present

## 2015-09-07 DIAGNOSIS — E162 Hypoglycemia, unspecified: Secondary | ICD-10-CM | POA: Diagnosis not present

## 2015-09-07 DIAGNOSIS — N2581 Secondary hyperparathyroidism of renal origin: Secondary | ICD-10-CM | POA: Diagnosis not present

## 2015-09-10 DIAGNOSIS — Z992 Dependence on renal dialysis: Secondary | ICD-10-CM | POA: Diagnosis not present

## 2015-09-10 DIAGNOSIS — N186 End stage renal disease: Secondary | ICD-10-CM | POA: Diagnosis not present

## 2015-09-10 DIAGNOSIS — E11649 Type 2 diabetes mellitus with hypoglycemia without coma: Secondary | ICD-10-CM | POA: Diagnosis not present

## 2015-09-10 DIAGNOSIS — N2581 Secondary hyperparathyroidism of renal origin: Secondary | ICD-10-CM | POA: Diagnosis not present

## 2015-09-10 DIAGNOSIS — E162 Hypoglycemia, unspecified: Secondary | ICD-10-CM | POA: Diagnosis not present

## 2015-09-10 DIAGNOSIS — D509 Iron deficiency anemia, unspecified: Secondary | ICD-10-CM | POA: Diagnosis not present

## 2015-09-12 DIAGNOSIS — N186 End stage renal disease: Secondary | ICD-10-CM | POA: Diagnosis not present

## 2015-09-12 DIAGNOSIS — D509 Iron deficiency anemia, unspecified: Secondary | ICD-10-CM | POA: Diagnosis not present

## 2015-09-12 DIAGNOSIS — N2581 Secondary hyperparathyroidism of renal origin: Secondary | ICD-10-CM | POA: Diagnosis not present

## 2015-09-12 DIAGNOSIS — E162 Hypoglycemia, unspecified: Secondary | ICD-10-CM | POA: Diagnosis not present

## 2015-09-12 DIAGNOSIS — E11649 Type 2 diabetes mellitus with hypoglycemia without coma: Secondary | ICD-10-CM | POA: Diagnosis not present

## 2015-09-12 DIAGNOSIS — Z992 Dependence on renal dialysis: Secondary | ICD-10-CM | POA: Diagnosis not present

## 2015-09-14 DIAGNOSIS — E11649 Type 2 diabetes mellitus with hypoglycemia without coma: Secondary | ICD-10-CM | POA: Diagnosis not present

## 2015-09-14 DIAGNOSIS — E162 Hypoglycemia, unspecified: Secondary | ICD-10-CM | POA: Diagnosis not present

## 2015-09-14 DIAGNOSIS — D509 Iron deficiency anemia, unspecified: Secondary | ICD-10-CM | POA: Diagnosis not present

## 2015-09-14 DIAGNOSIS — Z992 Dependence on renal dialysis: Secondary | ICD-10-CM | POA: Diagnosis not present

## 2015-09-14 DIAGNOSIS — N2581 Secondary hyperparathyroidism of renal origin: Secondary | ICD-10-CM | POA: Diagnosis not present

## 2015-09-14 DIAGNOSIS — N186 End stage renal disease: Secondary | ICD-10-CM | POA: Diagnosis not present

## 2015-09-17 DIAGNOSIS — N186 End stage renal disease: Secondary | ICD-10-CM | POA: Diagnosis not present

## 2015-09-17 DIAGNOSIS — N2581 Secondary hyperparathyroidism of renal origin: Secondary | ICD-10-CM | POA: Diagnosis not present

## 2015-09-17 DIAGNOSIS — Z992 Dependence on renal dialysis: Secondary | ICD-10-CM | POA: Diagnosis not present

## 2015-09-17 DIAGNOSIS — D509 Iron deficiency anemia, unspecified: Secondary | ICD-10-CM | POA: Diagnosis not present

## 2015-09-17 DIAGNOSIS — E162 Hypoglycemia, unspecified: Secondary | ICD-10-CM | POA: Diagnosis not present

## 2015-09-17 DIAGNOSIS — E11649 Type 2 diabetes mellitus with hypoglycemia without coma: Secondary | ICD-10-CM | POA: Diagnosis not present

## 2015-09-19 DIAGNOSIS — N186 End stage renal disease: Secondary | ICD-10-CM | POA: Diagnosis not present

## 2015-09-19 DIAGNOSIS — N2581 Secondary hyperparathyroidism of renal origin: Secondary | ICD-10-CM | POA: Diagnosis not present

## 2015-09-19 DIAGNOSIS — Z992 Dependence on renal dialysis: Secondary | ICD-10-CM | POA: Diagnosis not present

## 2015-09-19 DIAGNOSIS — E162 Hypoglycemia, unspecified: Secondary | ICD-10-CM | POA: Diagnosis not present

## 2015-09-19 DIAGNOSIS — D509 Iron deficiency anemia, unspecified: Secondary | ICD-10-CM | POA: Diagnosis not present

## 2015-09-19 DIAGNOSIS — E11649 Type 2 diabetes mellitus with hypoglycemia without coma: Secondary | ICD-10-CM | POA: Diagnosis not present

## 2015-09-20 DIAGNOSIS — R208 Other disturbances of skin sensation: Secondary | ICD-10-CM | POA: Diagnosis not present

## 2015-09-20 DIAGNOSIS — I10 Essential (primary) hypertension: Secondary | ICD-10-CM | POA: Diagnosis not present

## 2015-09-20 DIAGNOSIS — S30810A Abrasion of lower back and pelvis, initial encounter: Secondary | ICD-10-CM | POA: Diagnosis not present

## 2015-09-21 DIAGNOSIS — E11649 Type 2 diabetes mellitus with hypoglycemia without coma: Secondary | ICD-10-CM | POA: Diagnosis not present

## 2015-09-21 DIAGNOSIS — N2581 Secondary hyperparathyroidism of renal origin: Secondary | ICD-10-CM | POA: Diagnosis not present

## 2015-09-21 DIAGNOSIS — N186 End stage renal disease: Secondary | ICD-10-CM | POA: Diagnosis not present

## 2015-09-21 DIAGNOSIS — E162 Hypoglycemia, unspecified: Secondary | ICD-10-CM | POA: Diagnosis not present

## 2015-09-21 DIAGNOSIS — Z992 Dependence on renal dialysis: Secondary | ICD-10-CM | POA: Diagnosis not present

## 2015-09-21 DIAGNOSIS — D509 Iron deficiency anemia, unspecified: Secondary | ICD-10-CM | POA: Diagnosis not present

## 2015-09-24 DIAGNOSIS — Z992 Dependence on renal dialysis: Secondary | ICD-10-CM | POA: Diagnosis not present

## 2015-09-24 DIAGNOSIS — N2581 Secondary hyperparathyroidism of renal origin: Secondary | ICD-10-CM | POA: Diagnosis not present

## 2015-09-24 DIAGNOSIS — D509 Iron deficiency anemia, unspecified: Secondary | ICD-10-CM | POA: Diagnosis not present

## 2015-09-24 DIAGNOSIS — E162 Hypoglycemia, unspecified: Secondary | ICD-10-CM | POA: Diagnosis not present

## 2015-09-24 DIAGNOSIS — N186 End stage renal disease: Secondary | ICD-10-CM | POA: Diagnosis not present

## 2015-09-24 DIAGNOSIS — E11649 Type 2 diabetes mellitus with hypoglycemia without coma: Secondary | ICD-10-CM | POA: Diagnosis not present

## 2015-09-26 DIAGNOSIS — D509 Iron deficiency anemia, unspecified: Secondary | ICD-10-CM | POA: Diagnosis not present

## 2015-09-26 DIAGNOSIS — N186 End stage renal disease: Secondary | ICD-10-CM | POA: Diagnosis not present

## 2015-09-26 DIAGNOSIS — Z992 Dependence on renal dialysis: Secondary | ICD-10-CM | POA: Diagnosis not present

## 2015-09-26 DIAGNOSIS — E162 Hypoglycemia, unspecified: Secondary | ICD-10-CM | POA: Diagnosis not present

## 2015-09-26 DIAGNOSIS — E11649 Type 2 diabetes mellitus with hypoglycemia without coma: Secondary | ICD-10-CM | POA: Diagnosis not present

## 2015-09-26 DIAGNOSIS — N2581 Secondary hyperparathyroidism of renal origin: Secondary | ICD-10-CM | POA: Diagnosis not present

## 2015-09-28 DIAGNOSIS — N2581 Secondary hyperparathyroidism of renal origin: Secondary | ICD-10-CM | POA: Diagnosis not present

## 2015-09-28 DIAGNOSIS — E162 Hypoglycemia, unspecified: Secondary | ICD-10-CM | POA: Diagnosis not present

## 2015-09-28 DIAGNOSIS — N186 End stage renal disease: Secondary | ICD-10-CM | POA: Diagnosis not present

## 2015-09-28 DIAGNOSIS — Z992 Dependence on renal dialysis: Secondary | ICD-10-CM | POA: Diagnosis not present

## 2015-09-28 DIAGNOSIS — D509 Iron deficiency anemia, unspecified: Secondary | ICD-10-CM | POA: Diagnosis not present

## 2015-09-28 DIAGNOSIS — E11649 Type 2 diabetes mellitus with hypoglycemia without coma: Secondary | ICD-10-CM | POA: Diagnosis not present

## 2015-10-01 DIAGNOSIS — E11649 Type 2 diabetes mellitus with hypoglycemia without coma: Secondary | ICD-10-CM | POA: Diagnosis not present

## 2015-10-01 DIAGNOSIS — N2581 Secondary hyperparathyroidism of renal origin: Secondary | ICD-10-CM | POA: Diagnosis not present

## 2015-10-01 DIAGNOSIS — E162 Hypoglycemia, unspecified: Secondary | ICD-10-CM | POA: Diagnosis not present

## 2015-10-01 DIAGNOSIS — D509 Iron deficiency anemia, unspecified: Secondary | ICD-10-CM | POA: Diagnosis not present

## 2015-10-01 DIAGNOSIS — N186 End stage renal disease: Secondary | ICD-10-CM | POA: Diagnosis not present

## 2015-10-01 DIAGNOSIS — Z992 Dependence on renal dialysis: Secondary | ICD-10-CM | POA: Diagnosis not present

## 2015-10-03 DIAGNOSIS — N186 End stage renal disease: Secondary | ICD-10-CM | POA: Diagnosis not present

## 2015-10-03 DIAGNOSIS — N2581 Secondary hyperparathyroidism of renal origin: Secondary | ICD-10-CM | POA: Diagnosis not present

## 2015-10-03 DIAGNOSIS — D509 Iron deficiency anemia, unspecified: Secondary | ICD-10-CM | POA: Diagnosis not present

## 2015-10-03 DIAGNOSIS — Z992 Dependence on renal dialysis: Secondary | ICD-10-CM | POA: Diagnosis not present

## 2015-10-03 DIAGNOSIS — E162 Hypoglycemia, unspecified: Secondary | ICD-10-CM | POA: Diagnosis not present

## 2015-10-03 DIAGNOSIS — E11649 Type 2 diabetes mellitus with hypoglycemia without coma: Secondary | ICD-10-CM | POA: Diagnosis not present

## 2015-10-04 DIAGNOSIS — Z992 Dependence on renal dialysis: Secondary | ICD-10-CM | POA: Diagnosis not present

## 2015-10-04 DIAGNOSIS — N186 End stage renal disease: Secondary | ICD-10-CM | POA: Diagnosis not present

## 2015-10-05 DIAGNOSIS — R112 Nausea with vomiting, unspecified: Secondary | ICD-10-CM | POA: Diagnosis not present

## 2015-10-05 DIAGNOSIS — Z23 Encounter for immunization: Secondary | ICD-10-CM | POA: Diagnosis not present

## 2015-10-05 DIAGNOSIS — N186 End stage renal disease: Secondary | ICD-10-CM | POA: Diagnosis not present

## 2015-10-05 DIAGNOSIS — E162 Hypoglycemia, unspecified: Secondary | ICD-10-CM | POA: Diagnosis not present

## 2015-10-05 DIAGNOSIS — N2581 Secondary hyperparathyroidism of renal origin: Secondary | ICD-10-CM | POA: Diagnosis not present

## 2015-10-05 DIAGNOSIS — D509 Iron deficiency anemia, unspecified: Secondary | ICD-10-CM | POA: Diagnosis not present

## 2015-10-05 DIAGNOSIS — Z992 Dependence on renal dialysis: Secondary | ICD-10-CM | POA: Diagnosis not present

## 2015-10-05 DIAGNOSIS — E11649 Type 2 diabetes mellitus with hypoglycemia without coma: Secondary | ICD-10-CM | POA: Diagnosis not present

## 2015-10-07 IMAGING — DX DG CHEST 2V
2 series · 2 of 2 positions shown · non-contrast
Comparison: 02/16/2014

CLINICAL DATA: Mid chest pain and shortness of breath following
hemodialysis today.

EXAM:
CHEST - 2 VIEW

[chest pa]
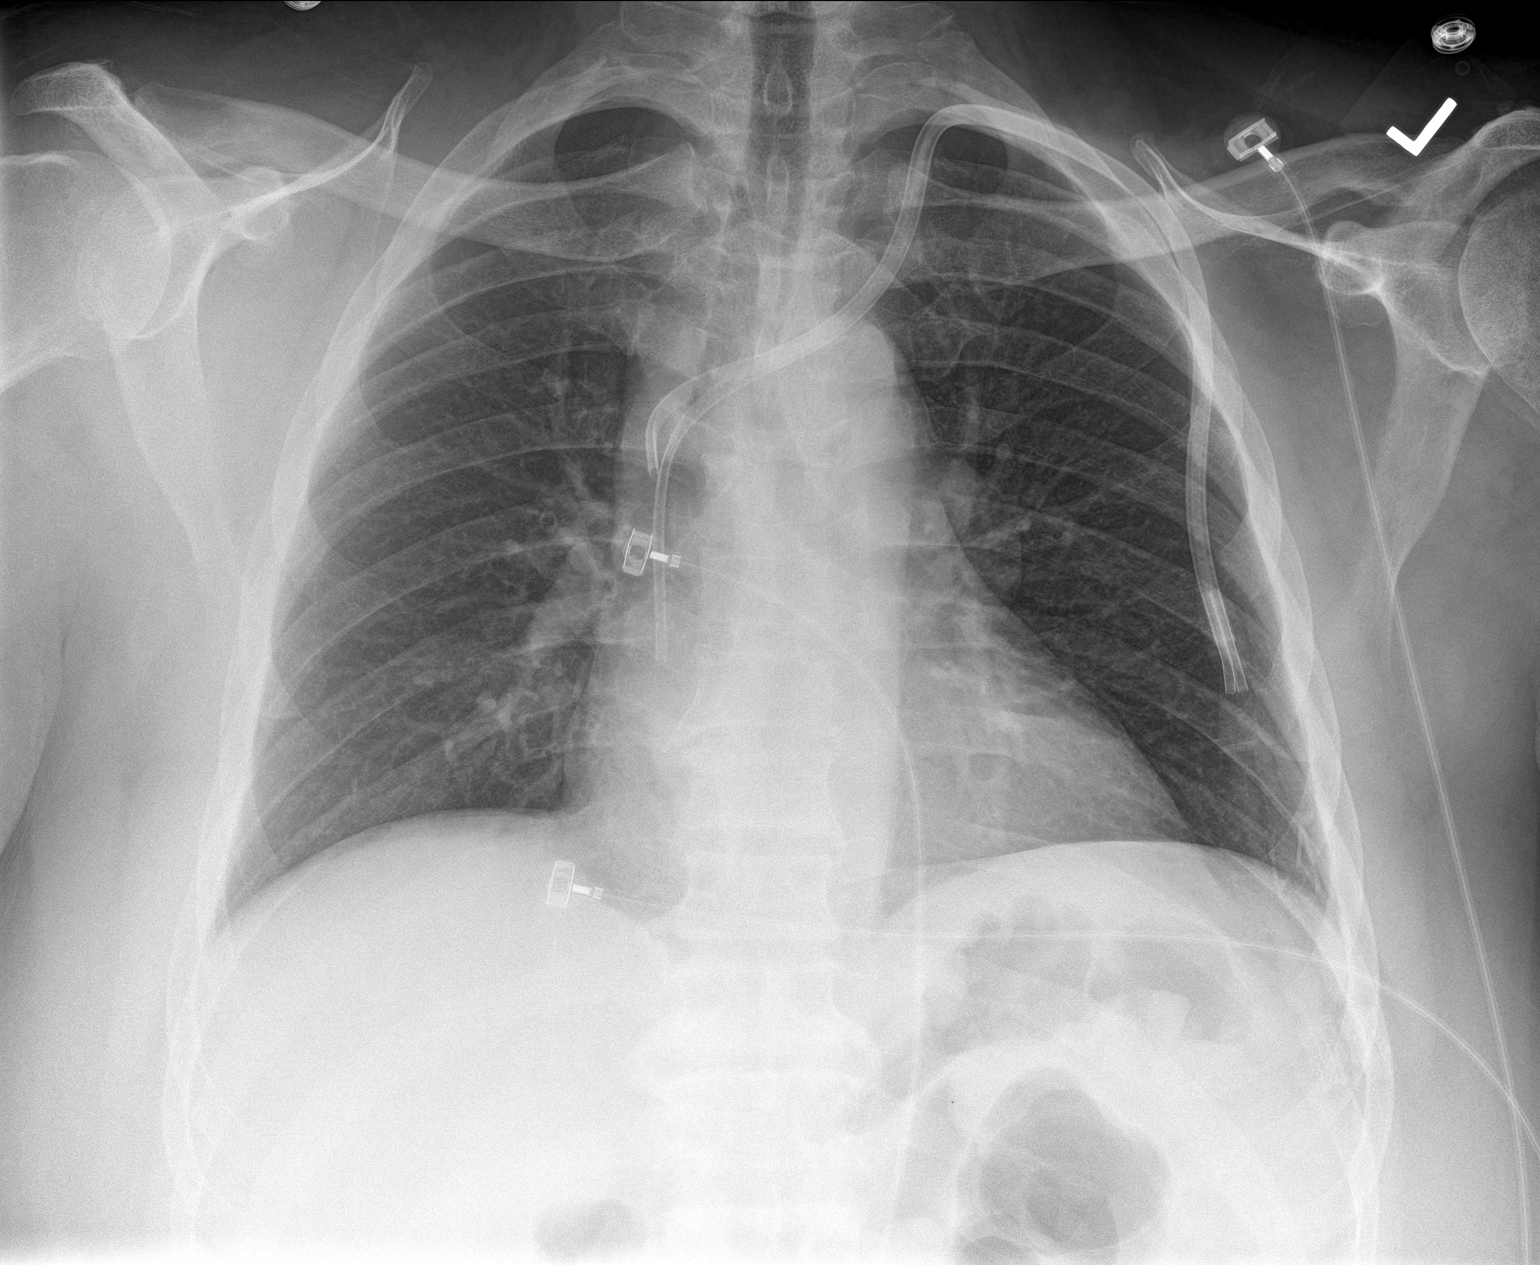

[chest lat]
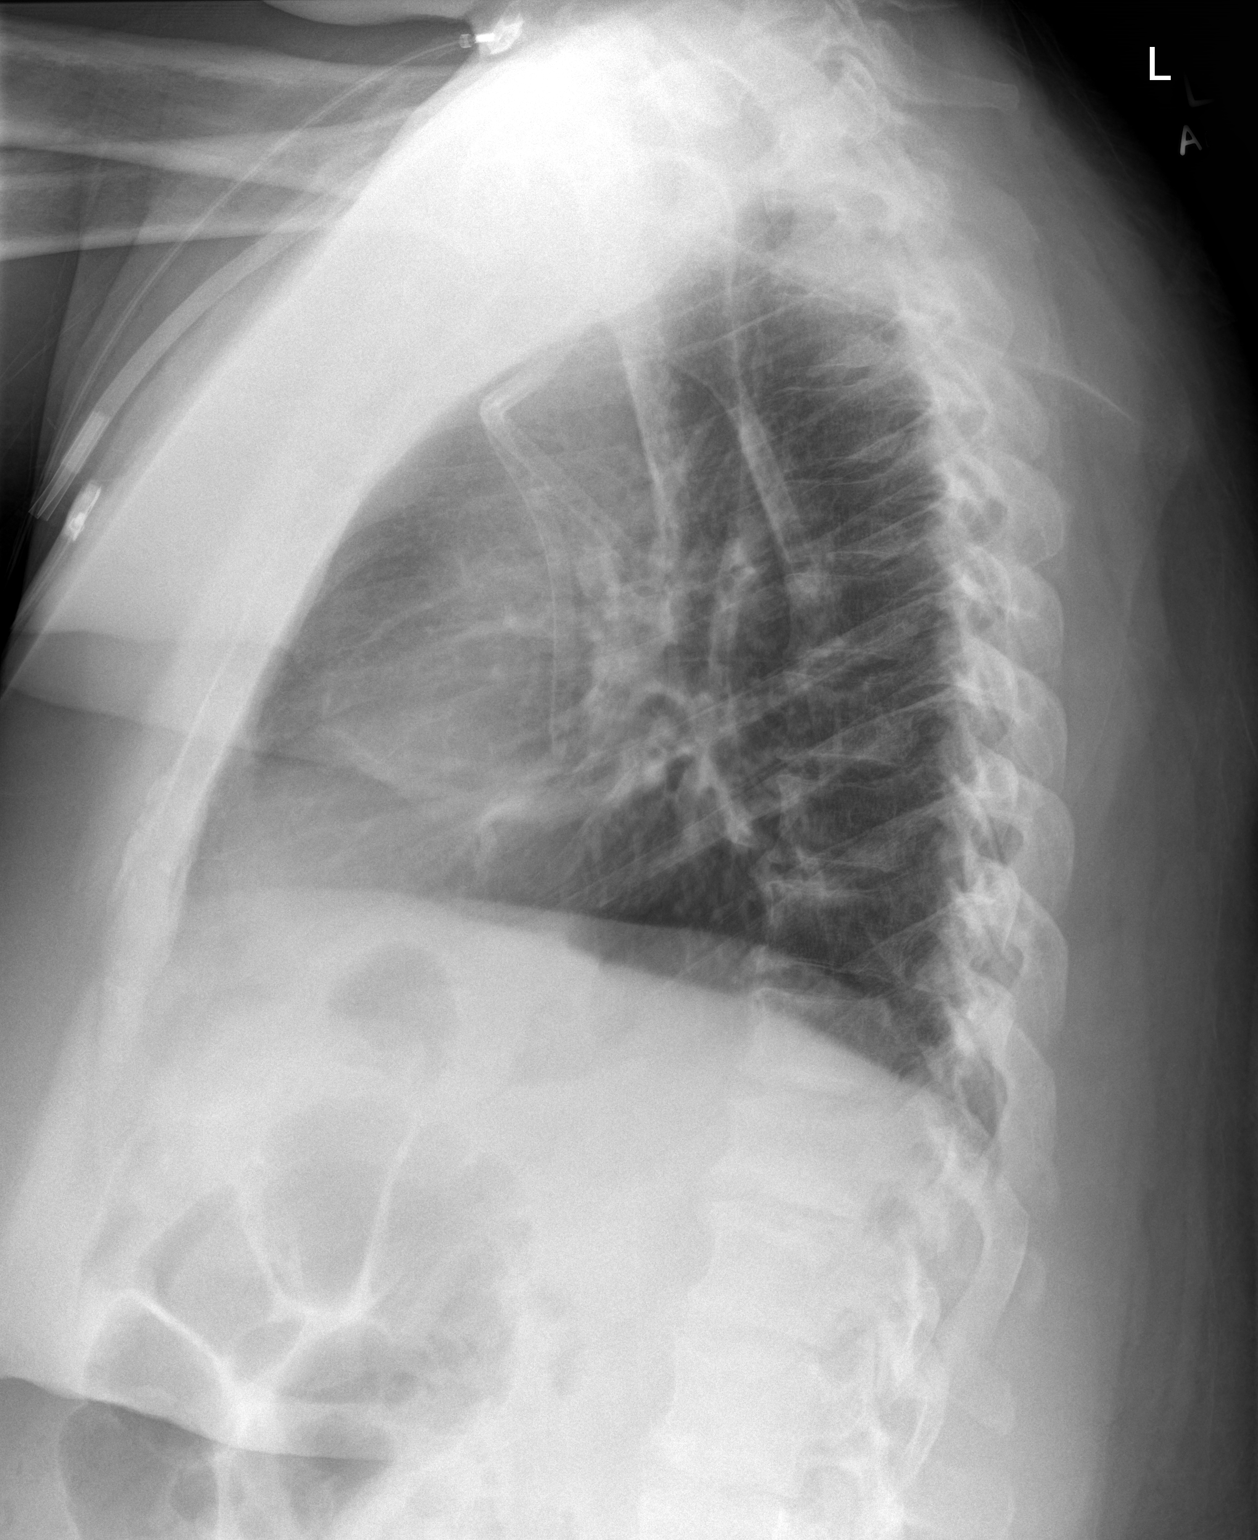

[2 of 2 positions shown; findings below may reference images not displayed]

FINDINGS: Left jugular dialysis catheter extends into SVC. There is no
evidence of pulmonary edema, consolidation, pneumothorax, nodule or
pleural fluid. The heart size is at the upper limits of normal. The
bony thorax is unremarkable.
IMPRESSION: No active disease.

## 2015-10-09 DIAGNOSIS — E162 Hypoglycemia, unspecified: Secondary | ICD-10-CM | POA: Diagnosis not present

## 2015-10-09 DIAGNOSIS — N186 End stage renal disease: Secondary | ICD-10-CM | POA: Diagnosis not present

## 2015-10-09 DIAGNOSIS — Z23 Encounter for immunization: Secondary | ICD-10-CM | POA: Diagnosis not present

## 2015-10-09 DIAGNOSIS — D509 Iron deficiency anemia, unspecified: Secondary | ICD-10-CM | POA: Diagnosis not present

## 2015-10-09 DIAGNOSIS — N2581 Secondary hyperparathyroidism of renal origin: Secondary | ICD-10-CM | POA: Diagnosis not present

## 2015-10-09 DIAGNOSIS — R112 Nausea with vomiting, unspecified: Secondary | ICD-10-CM | POA: Diagnosis not present

## 2015-10-10 DIAGNOSIS — R112 Nausea with vomiting, unspecified: Secondary | ICD-10-CM | POA: Diagnosis not present

## 2015-10-10 DIAGNOSIS — Z23 Encounter for immunization: Secondary | ICD-10-CM | POA: Diagnosis not present

## 2015-10-10 DIAGNOSIS — N186 End stage renal disease: Secondary | ICD-10-CM | POA: Diagnosis not present

## 2015-10-10 DIAGNOSIS — E162 Hypoglycemia, unspecified: Secondary | ICD-10-CM | POA: Diagnosis not present

## 2015-10-10 DIAGNOSIS — D509 Iron deficiency anemia, unspecified: Secondary | ICD-10-CM | POA: Diagnosis not present

## 2015-10-10 DIAGNOSIS — N2581 Secondary hyperparathyroidism of renal origin: Secondary | ICD-10-CM | POA: Diagnosis not present

## 2015-10-12 DIAGNOSIS — R112 Nausea with vomiting, unspecified: Secondary | ICD-10-CM | POA: Diagnosis not present

## 2015-10-12 DIAGNOSIS — N186 End stage renal disease: Secondary | ICD-10-CM | POA: Diagnosis not present

## 2015-10-12 DIAGNOSIS — L602 Onychogryphosis: Secondary | ICD-10-CM | POA: Diagnosis not present

## 2015-10-12 DIAGNOSIS — N2581 Secondary hyperparathyroidism of renal origin: Secondary | ICD-10-CM | POA: Diagnosis not present

## 2015-10-12 DIAGNOSIS — M216X1 Other acquired deformities of right foot: Secondary | ICD-10-CM | POA: Diagnosis not present

## 2015-10-12 DIAGNOSIS — D509 Iron deficiency anemia, unspecified: Secondary | ICD-10-CM | POA: Diagnosis not present

## 2015-10-12 DIAGNOSIS — M216X2 Other acquired deformities of left foot: Secondary | ICD-10-CM | POA: Diagnosis not present

## 2015-10-12 DIAGNOSIS — E1140 Type 2 diabetes mellitus with diabetic neuropathy, unspecified: Secondary | ICD-10-CM | POA: Diagnosis not present

## 2015-10-12 DIAGNOSIS — L84 Corns and callosities: Secondary | ICD-10-CM | POA: Diagnosis not present

## 2015-10-12 DIAGNOSIS — Z23 Encounter for immunization: Secondary | ICD-10-CM | POA: Diagnosis not present

## 2015-10-12 DIAGNOSIS — E162 Hypoglycemia, unspecified: Secondary | ICD-10-CM | POA: Diagnosis not present

## 2015-10-14 DIAGNOSIS — N2581 Secondary hyperparathyroidism of renal origin: Secondary | ICD-10-CM | POA: Diagnosis not present

## 2015-10-14 DIAGNOSIS — Z23 Encounter for immunization: Secondary | ICD-10-CM | POA: Diagnosis not present

## 2015-10-14 DIAGNOSIS — N186 End stage renal disease: Secondary | ICD-10-CM | POA: Diagnosis not present

## 2015-10-14 DIAGNOSIS — R112 Nausea with vomiting, unspecified: Secondary | ICD-10-CM | POA: Diagnosis not present

## 2015-10-14 DIAGNOSIS — D509 Iron deficiency anemia, unspecified: Secondary | ICD-10-CM | POA: Diagnosis not present

## 2015-10-14 DIAGNOSIS — E162 Hypoglycemia, unspecified: Secondary | ICD-10-CM | POA: Diagnosis not present

## 2015-10-17 DIAGNOSIS — N186 End stage renal disease: Secondary | ICD-10-CM | POA: Diagnosis not present

## 2015-10-17 DIAGNOSIS — Z23 Encounter for immunization: Secondary | ICD-10-CM | POA: Diagnosis not present

## 2015-10-17 DIAGNOSIS — D509 Iron deficiency anemia, unspecified: Secondary | ICD-10-CM | POA: Diagnosis not present

## 2015-10-17 DIAGNOSIS — R112 Nausea with vomiting, unspecified: Secondary | ICD-10-CM | POA: Diagnosis not present

## 2015-10-17 DIAGNOSIS — N2581 Secondary hyperparathyroidism of renal origin: Secondary | ICD-10-CM | POA: Diagnosis not present

## 2015-10-17 DIAGNOSIS — E162 Hypoglycemia, unspecified: Secondary | ICD-10-CM | POA: Diagnosis not present

## 2015-10-19 DIAGNOSIS — R112 Nausea with vomiting, unspecified: Secondary | ICD-10-CM | POA: Diagnosis not present

## 2015-10-19 DIAGNOSIS — Z23 Encounter for immunization: Secondary | ICD-10-CM | POA: Diagnosis not present

## 2015-10-19 DIAGNOSIS — D509 Iron deficiency anemia, unspecified: Secondary | ICD-10-CM | POA: Diagnosis not present

## 2015-10-19 DIAGNOSIS — N186 End stage renal disease: Secondary | ICD-10-CM | POA: Diagnosis not present

## 2015-10-19 DIAGNOSIS — N2581 Secondary hyperparathyroidism of renal origin: Secondary | ICD-10-CM | POA: Diagnosis not present

## 2015-10-19 DIAGNOSIS — E162 Hypoglycemia, unspecified: Secondary | ICD-10-CM | POA: Diagnosis not present

## 2015-10-24 DIAGNOSIS — R112 Nausea with vomiting, unspecified: Secondary | ICD-10-CM | POA: Diagnosis not present

## 2015-10-24 DIAGNOSIS — N186 End stage renal disease: Secondary | ICD-10-CM | POA: Diagnosis not present

## 2015-10-24 DIAGNOSIS — N2581 Secondary hyperparathyroidism of renal origin: Secondary | ICD-10-CM | POA: Diagnosis not present

## 2015-10-24 DIAGNOSIS — Z23 Encounter for immunization: Secondary | ICD-10-CM | POA: Diagnosis not present

## 2015-10-24 DIAGNOSIS — D509 Iron deficiency anemia, unspecified: Secondary | ICD-10-CM | POA: Diagnosis not present

## 2015-10-24 DIAGNOSIS — E162 Hypoglycemia, unspecified: Secondary | ICD-10-CM | POA: Diagnosis not present

## 2015-10-26 DIAGNOSIS — R112 Nausea with vomiting, unspecified: Secondary | ICD-10-CM | POA: Diagnosis not present

## 2015-10-26 DIAGNOSIS — N2581 Secondary hyperparathyroidism of renal origin: Secondary | ICD-10-CM | POA: Diagnosis not present

## 2015-10-26 DIAGNOSIS — Z23 Encounter for immunization: Secondary | ICD-10-CM | POA: Diagnosis not present

## 2015-10-26 DIAGNOSIS — D509 Iron deficiency anemia, unspecified: Secondary | ICD-10-CM | POA: Diagnosis not present

## 2015-10-26 DIAGNOSIS — E162 Hypoglycemia, unspecified: Secondary | ICD-10-CM | POA: Diagnosis not present

## 2015-10-26 DIAGNOSIS — N186 End stage renal disease: Secondary | ICD-10-CM | POA: Diagnosis not present

## 2015-10-30 DIAGNOSIS — D509 Iron deficiency anemia, unspecified: Secondary | ICD-10-CM | POA: Diagnosis not present

## 2015-10-30 DIAGNOSIS — R112 Nausea with vomiting, unspecified: Secondary | ICD-10-CM | POA: Diagnosis not present

## 2015-10-30 DIAGNOSIS — E162 Hypoglycemia, unspecified: Secondary | ICD-10-CM | POA: Diagnosis not present

## 2015-10-30 DIAGNOSIS — N2581 Secondary hyperparathyroidism of renal origin: Secondary | ICD-10-CM | POA: Diagnosis not present

## 2015-10-30 DIAGNOSIS — Z23 Encounter for immunization: Secondary | ICD-10-CM | POA: Diagnosis not present

## 2015-10-30 DIAGNOSIS — N186 End stage renal disease: Secondary | ICD-10-CM | POA: Diagnosis not present

## 2015-11-01 ENCOUNTER — Emergency Department (HOSPITAL_COMMUNITY)
Admission: EM | Admit: 2015-11-01 | Discharge: 2015-11-02 | Disposition: A | Discharge status: Home / Self Care | Payer: Medicare Other | Attending: Emergency Medicine | Admitting: Emergency Medicine

## 2015-11-01 ENCOUNTER — Emergency Department (HOSPITAL_COMMUNITY): Payer: Medicare Other

## 2015-11-01 ENCOUNTER — Encounter (HOSPITAL_COMMUNITY): Payer: Self-pay | Admitting: Emergency Medicine

## 2015-11-01 DIAGNOSIS — S71001A Unspecified open wound, right hip, initial encounter: Secondary | ICD-10-CM

## 2015-11-01 DIAGNOSIS — S7001XA Contusion of right hip, initial encounter: Secondary | ICD-10-CM | POA: Diagnosis not present

## 2015-11-01 DIAGNOSIS — L0231 Cutaneous abscess of buttock: Secondary | ICD-10-CM | POA: Insufficient documentation

## 2015-11-01 DIAGNOSIS — E1122 Type 2 diabetes mellitus with diabetic chronic kidney disease: Secondary | ICD-10-CM | POA: Insufficient documentation

## 2015-11-01 DIAGNOSIS — N184 Chronic kidney disease, stage 4 (severe): Secondary | ICD-10-CM | POA: Insufficient documentation

## 2015-11-01 DIAGNOSIS — I120 Hypertensive chronic kidney disease with stage 5 chronic kidney disease or end stage renal disease: Secondary | ICD-10-CM | POA: Diagnosis not present

## 2015-11-01 DIAGNOSIS — K6289 Other specified diseases of anus and rectum: Secondary | ICD-10-CM | POA: Insufficient documentation

## 2015-11-01 DIAGNOSIS — S71002A Unspecified open wound, left hip, initial encounter: Secondary | ICD-10-CM | POA: Insufficient documentation

## 2015-11-01 DIAGNOSIS — Y999 Unspecified external cause status: Secondary | ICD-10-CM | POA: Diagnosis not present

## 2015-11-01 DIAGNOSIS — K625 Hemorrhage of anus and rectum: Secondary | ICD-10-CM

## 2015-11-01 DIAGNOSIS — I129 Hypertensive chronic kidney disease with stage 1 through stage 4 chronic kidney disease, or unspecified chronic kidney disease: Secondary | ICD-10-CM | POA: Diagnosis not present

## 2015-11-01 DIAGNOSIS — N186 End stage renal disease: Secondary | ICD-10-CM | POA: Insufficient documentation

## 2015-11-01 DIAGNOSIS — Y939 Activity, unspecified: Secondary | ICD-10-CM | POA: Diagnosis not present

## 2015-11-01 DIAGNOSIS — X58XXXA Exposure to other specified factors, initial encounter: Secondary | ICD-10-CM | POA: Diagnosis not present

## 2015-11-01 DIAGNOSIS — Y929 Unspecified place or not applicable: Secondary | ICD-10-CM | POA: Diagnosis not present

## 2015-11-01 DIAGNOSIS — Z79899 Other long term (current) drug therapy: Secondary | ICD-10-CM | POA: Insufficient documentation

## 2015-11-01 DIAGNOSIS — R05 Cough: Secondary | ICD-10-CM | POA: Diagnosis not present

## 2015-11-01 LAB — COMPREHENSIVE METABOLIC PANEL
ALT: 35 U/L (ref 17–63)
AST: 36 U/L (ref 15–41)
Albumin: 3.9 g/dL (ref 3.5–5.0)
Alkaline Phosphatase: 58 U/L (ref 38–126)
Anion gap: 12 (ref 5–15)
BUN: 90 mg/dL — AB (ref 6–20)
CHLORIDE: 101 mmol/L (ref 101–111)
CO2: 22 mmol/L (ref 22–32)
CREATININE: 22.6 mg/dL — AB (ref 0.61–1.24)
Calcium: 8.8 mg/dL — ABNORMAL LOW (ref 8.9–10.3)
GFR calc Af Amer: 2 mL/min — ABNORMAL LOW (ref >60)
GFR, EST NON AFRICAN AMERICAN: 2 mL/min — AB (ref >60)
Glucose, Bld: 93 mg/dL (ref 65–99)
Potassium: 4.8 mmol/L (ref 3.5–5.1)
SODIUM: 135 mmol/L (ref 135–145)
Total Bilirubin: 1 mg/dL (ref 0.3–1.2)
Total Protein: 8 g/dL (ref 6.5–8.1)

## 2015-11-01 LAB — I-STAT CHEM 8, ED
BUN: 81 mg/dL — ABNORMAL HIGH (ref 6–20)
Calcium, Ion: 1.1 mmol/L — ABNORMAL LOW (ref 1.15–1.40)
Chloride: 100 mmol/L — ABNORMAL LOW (ref 101–111)
Creatinine, Ser: >18 mg/dL — ABNORMAL HIGH (ref 0.61–1.24)
Glucose, Bld: 89 mg/dL (ref 65–99)
HCT: 42 % (ref 39.0–52.0)
Hemoglobin: 14.3 g/dL (ref 13.0–17.0)
Potassium: 4.8 mmol/L (ref 3.5–5.1)
Sodium: 139 mmol/L (ref 135–145)
TCO2: 25 mmol/L (ref 0–100)

## 2015-11-01 LAB — POC OCCULT BLOOD, ED: FECAL OCCULT BLD: POSITIVE — AB

## 2015-11-01 LAB — CBC WITH DIFFERENTIAL/PLATELET
Basophils Absolute: 0 10*3/uL (ref 0.0–0.1)
Basophils Relative: 0 %
Eosinophils Absolute: 0.1 10*3/uL (ref 0.0–0.7)
Eosinophils Relative: 1 %
HCT: 38.3 % — ABNORMAL LOW (ref 39.0–52.0)
Hemoglobin: 12.9 g/dL — ABNORMAL LOW (ref 13.0–17.0)
Lymphocytes Relative: 18 %
Lymphs Abs: 2.4 10*3/uL (ref 0.7–4.0)
MCH: 30.8 pg (ref 26.0–34.0)
MCHC: 33.7 g/dL (ref 30.0–36.0)
MCV: 91.4 fL (ref 78.0–100.0)
Monocytes Absolute: 1 10*3/uL (ref 0.1–1.0)
Monocytes Relative: 7 %
Neutro Abs: 9.9 10*3/uL — ABNORMAL HIGH (ref 1.7–7.7)
Neutrophils Relative %: 74 %
Platelets: 156 10*3/uL (ref 150–400)
RBC: 4.19 MIL/uL — ABNORMAL LOW (ref 4.22–5.81)
RDW: 15.3 % (ref 11.5–15.5)
WBC: 13.4 10*3/uL — ABNORMAL HIGH (ref 4.0–10.5)

## 2015-11-01 LAB — PROTIME-INR
INR: 1.09
Prothrombin Time: 14.1 seconds (ref 11.4–15.2)

## 2015-11-01 LAB — I-STAT CG4 LACTIC ACID, ED: Lactic Acid, Venous: 1.32 mmol/L (ref 0.5–1.9)

## 2015-11-01 NOTE — ED Triage Notes (Signed)
Pt reports rectal pain x 2 days, states he thinks he's having rectal bleeding. Pt also reports ingrown hair in his pubic area.

## 2015-11-01 NOTE — ED Provider Notes (Signed)
[facility] DEPT Provider Note   CSN: [id number] Arrival date & time: 11/01/15  1948 By signing my name below, I, Dyke Brackett, attest that this documentation has been prepared under the direction and in the presence of Ezequiel Essex, MD . Electronically Signed: Dyke Brackett, Scribe. 11/01/2015. 9:29 PM.   History   Chief Complaint Chief Complaint  Patient presents with  . Rectal Pain    HPI Jack Irwin is a 50 y.o. male who presents to the Emergency Department complaining of moderate rectal pain onset two days. He notes a lesion to his rectum with bleeding and draining as swell as a lesion to his pubic area. Last sexual activity 2-3 weeks ago. Pt also complains of associated fever and cough. Pt is a dialysis pt; last dialysis 6 days ago. He missed his last two dialysis treatments. He denies any SOB, CP, abdominal pain, or hematochezia. He denies any use of blood thinners.   The history is provided by the patient. No language interpreter was used.   Past Medical History:  Diagnosis Date  . Anemia   . Arthritis   . BPH (benign prostatic hyperplasia)   . Chronic back pain   . Chronic kidney disease   . Chronic kidney disease (CKD), stage IV (severe) ([place])   . Constipation   . Constipation   . Decubitus ulcer    of 2nd toes of both feet.  . Diabetes mellitus   . Difficulty walking   . DVT (deep venous thrombosis) ([place])   . Edema leg   . Gout   . Hypertension   . Lack of coordination   . Neuropathy ([place])   . Neuropathy, diabetic ([place])   . Physical deconditioning   . Poor balance   . Renal insufficiency   . Venous (peripheral) insufficiency     Patient Active Problem List   Diagnosis Date Noted  . Nausea & vomiting 11/17/2014  . Uremia syndrome 11/17/2014  . Uremia 11/17/2014  . Thrombocytopenia ([place]) 02/21/2014  . Morbid obesity ([place]) 03/30/2013  . RBBB 03/29/2013  . Hyperkalemia 03/28/2013  . Leukocytosis 03/28/2013  . Nausea vomiting and diarrhea  03/28/2013  . BBB (bundle branch block) 03/28/2013  . Hypertension   . ESRD on dialysis ([place])   . Lack of coordination 04/15/2012  . Muscle weakness (generalized) 04/15/2012  . Arthritis, gouty 04/15/2012  . Difficulty in walking(719.7) 08/18/2011  . Weakness of both legs 08/18/2011  . Poor balance 08/18/2011    Past Surgical History:  Procedure Laterality Date  . AV FISTULA PLACEMENT Left 03/09/2014   Procedure: INSERTION OF ARTERIOVENOUS (AV) GORE-TEX GRAFT ARM;  Surgeon: Angelia Mould, MD;  Location: [facility];  Service: Vascular;  Laterality: Left;  . CYST EXCISION Right    cyst removed on thumb 2001  . INSERTION OF DIALYSIS CATHETER    . INSERTION OF DIALYSIS CATHETER Left 03/09/2014   Procedure: INSERTION OF DIALYSIS CATHETER;  Surgeon: Angelia Mould, MD;  Location: [facility];  Service: Vascular;  Laterality: Left;  . none       Home Medications    Prior to Admission medications   Medication Sig Start Date End Date Taking? Authorizing Provider  calcium acetate (PHOSLO) 667 MG capsule Take 1 capsule by mouth 3 (three) times daily. 04/11/14   Historical Provider, MD  colchicine 0.6 MG tablet 1/2 tab po 2 times /week 08/15/15   Lily Kocher, PA-C  diclofenac sodium (VOLTAREN) 1 % GEL Apply to left wrist and hand three times daily  Patient not taking: Reported on 08/15/2015 04/13/14   Lily Kocher, PA-C  gabapentin (NEURONTIN) 100 MG capsule Take 100 mg by mouth daily.  07/11/14   Historical Provider, MD  multivitamin (RENA-VIT) TABS tablet Take 1 tablet by mouth daily.    Historical Provider, MD  RENVELA 800 MG tablet Take 800 mg by mouth 3 (three) times daily with meals.  07/18/14   Historical Provider, MD  sildenafil (VIAGRA) 50 MG tablet Take 50 mg by mouth daily as needed for erectile dysfunction.    Historical Provider, MD  Skin Protectants, Misc. (EUCERIN) cream Apply 1 application topically 2 (two) times daily.    Historical Provider, MD  triamcinolone cream (KENALOG) 0.1  % Apply 1 application topically 2 (two) times daily.    Historical Provider, MD  ULORIC 80 MG TABS Take 80 mg by mouth daily. 09/26/14   Historical Provider, MD    Family History Family History  Problem Relation Age of Onset  . Diabetes Mother   . Hypertension Mother   . Heart failure Mother   . Hyperlipidemia Mother   . Heart attack Mother   . Cancer Father   . Diabetes Father   . Hypertension Father   . Hyperlipidemia Father     Social History Social History  Substance Use Topics  . Smoking status: Never Smoker  . Smokeless tobacco: Never Used  . Alcohol use No    Allergies   Review of patient's allergies indicates no known allergies.  Review of Systems Review of Systems 10 systems reviewed and all are negative for acute change except as noted in the HPI.  Physical Exam Updated Vital Signs BP 120/73 (BP Location: Right Arm)   Pulse 111   Temp 98.8 F (37.1 C) (Oral)   Resp 18   Ht 5\' 11"  (1.803 m)   Wt 263 lb (119.3 kg)   SpO2 95%   BMI 36.68 kg/m   Physical Exam  Constitutional: He is oriented to person, place, and time. He appears well-developed and well-nourished. No distress.  Feels warm  HENT:  Head: Normocephalic and atraumatic.  Mouth/Throat: Oropharynx is clear and moist. No oropharyngeal exudate.  Eyes: Conjunctivae and EOM are normal. Pupils are equal, round, and reactive to light.  Neck: Normal range of motion. Neck supple.  No meningismus.  Cardiovascular: Normal rate, regular rhythm, normal heart sounds and intact distal pulses.   No murmur heard. Pulmonary/Chest: Effort normal and breath sounds normal. No respiratory distress.  Abdominal: Soft. There is no tenderness. There is no rebound and no guarding.  Genitourinary:  Genitourinary Comments: Rectal exam shows pink stool; no gross blood Chaperone (scribe) was present for exam which was performed with no discomfort or complications.       Musculoskeletal: Normal range of motion. He  exhibits no edema or tenderness.  Neurological: He is alert and oriented to person, place, and time. No cranial nerve deficit. He exhibits normal muscle tone. Coordination normal.   5/5 strength throughout. CN 2-12 intact.Equal grip strength.   Skin: Skin is warm.  Av graft left upper arm, intact thrill Suprapubic ulceration, no fluctuance or erythema Right posterior thigh has open wound that is draining blood about 0.5 cm; no surrounding erythema or fluctuance. Appears to be a possible open abscess   Psychiatric: He has a normal mood and affect. His behavior is normal.  Nursing note and vitals reviewed.  ED Treatments / Results  DIAGNOSTIC STUDIES:  Oxygen Saturation is 95% on RA, adequate by my interpretation.  COORDINATION OF CARE:  9:25 PM Will order Protime-INR, CBC, I-stat CG4 lactic acid, I-stat chem 8, and CMP. Discussed treatment plan with pt at bedside and pt agreed to plan.  Labs (all labs ordered are listed, but only abnormal results are displayed) Labs Reviewed  CBC WITH DIFFERENTIAL/PLATELET - Abnormal; Notable for the following:       Result Value   WBC 13.4 (*)    RBC 4.19 (*)    Hemoglobin 12.9 (*)    HCT 38.3 (*)    Neutro Abs 9.9 (*)    All other components within normal limits  COMPREHENSIVE METABOLIC PANEL - Abnormal; Notable for the following:    BUN 90 (*)    Creatinine, Ser 22.60 (*)    Calcium 8.8 (*)    GFR calc non Af Amer 2 (*)    GFR calc Af Amer 2 (*)    All other components within normal limits  POC OCCULT BLOOD, ED - Abnormal; Notable for the following:    Fecal Occult Bld POSITIVE (*)    All other components within normal limits  I-STAT CHEM 8, ED - Abnormal; Notable for the following:    Chloride 100 (*)    BUN 81 (*)    Creatinine, Ser >18.00 (*)    Calcium, Ion 1.10 (*)    All other components within normal limits  CULTURE, BLOOD (ROUTINE X 2)  CULTURE, BLOOD (ROUTINE X 2)  PROTIME-INR  RPR  I-STAT CG4 LACTIC ACID, ED    EKG   EKG Interpretation  Date/Time:  Thursday November 01 2015 21:35:10 EDT Ventricular Rate:  94 PR Interval:    QRS Duration: 130 QT Interval:  363 QTC Calculation: 454 R Axis:   143 Text Interpretation:  Sinus rhythm Right bundle branch block ST elevation suggests acute pericarditis No significant change was found Confirmed by Wyvonnia Dusky  MD, Saagar Tortorella [phone]) on 11/01/2015 9:40:22 PM       Radiology Dg Chest 2 View  Result Date: 11/01/2015 CLINICAL DATA:  Rectal pain for 2 days.  Fever and cough. EXAM: CHEST  2 VIEW COMPARISON:  12/20/2014 FINDINGS: Normal heart size. No pleural effusion or edema identified. No airspace consolidation noted. Spondylosis noted within the thoracic spine. There is osteoarthritis involving the glenohumeral joints. IMPRESSION: 1. No acute cardiopulmonary abnormalities. Electronically Signed   By: Kerby Moors M.D.   On: 11/01/2015 22:53    Procedures Procedures (including critical care time)  Medications Ordered in ED Medications - No data to display   Initial Impression / Assessment and Plan / ED Course  I have reviewed the triage vital signs and the nursing notes.  Pertinent labs & imaging results that were available during my care of the patient were reviewed by me and considered in my medical decision making (see chart for details).  Clinical Course   Dialysis patient with "rectal bleeding" that onset today. Patient also has not had dialysis for 6 days and is due tomorrow. Denies any chest pain or shortness of breath.  On exam patient appears to be bleeding from a wound to his right posterior thigh. He also however has pink tinged stool in rectal exam.  Hemoglobin is stable. Potassium is normal. No EKG changes. Chest x-ray is negative. RPR sent for pubic ulceration. No indication for emergent dialysis tonight.  "rectal bleeding" appears to be from wound on R buttock. There is no fluctuance or surrounding erythema.   CT scan will be obtained to  evaluate for possible fistula or deep  abscess.  Patient has dialysis in the morning and he is strongly encouraged to go.  He has missed his last 2 sessions but he has no significant fluid overload, hypoxia, SOB or hyperkalemia.  CT pending at time of sign out to Dr. Betsey Holiday.  BP 118/76   Pulse 92   Temp 97.7 F (36.5 C) (Rectal)   Resp 15   Ht 5\' 11"  (1.803 m)   Wt 263 lb (119.3 kg)   SpO2 98%   BMI 36.68 kg/m    Final Clinical Impressions(s) / ED Diagnoses   Final diagnoses:  Rectal bleeding  ESRD (end stage renal disease) ([place])  Bleeding from right hip wound, initial encounter ([place])  Cutaneous abscess of buttock    New Prescriptions New Prescriptions   No medications on file  I personally performed the services described in this documentation, which was scribed in my presence. The recorded information has been reviewed and is accurate.    Ezequiel Essex, MD 11/02/15 [phone]

## 2015-11-01 NOTE — ED Notes (Signed)
Pt c/o weakness all over, rectal bleeding,

## 2015-11-01 NOTE — ED Notes (Signed)
Pt in ct at present time,

## 2015-11-01 NOTE — ED Notes (Signed)
Pt returned from ct

## 2015-11-01 NOTE — ED Notes (Signed)
Pt updated, given ginger ale and crackers per request,

## 2015-11-02 DIAGNOSIS — R112 Nausea with vomiting, unspecified: Secondary | ICD-10-CM | POA: Diagnosis not present

## 2015-11-02 DIAGNOSIS — E162 Hypoglycemia, unspecified: Secondary | ICD-10-CM | POA: Diagnosis not present

## 2015-11-02 DIAGNOSIS — D509 Iron deficiency anemia, unspecified: Secondary | ICD-10-CM | POA: Diagnosis not present

## 2015-11-02 DIAGNOSIS — N2581 Secondary hyperparathyroidism of renal origin: Secondary | ICD-10-CM | POA: Diagnosis not present

## 2015-11-02 DIAGNOSIS — N186 End stage renal disease: Secondary | ICD-10-CM | POA: Diagnosis not present

## 2015-11-02 DIAGNOSIS — Z23 Encounter for immunization: Secondary | ICD-10-CM | POA: Diagnosis not present

## 2015-11-02 MED ORDER — DOXYCYCLINE HYCLATE 100 MG PO CAPS: 100.0000 mg | ORAL_CAPSULE | Freq: Two times a day (BID) | ORAL | 0 refills | Status: DC

## 2015-11-02 NOTE — ED Notes (Signed)
Pt updated on plan of care, denies any complaints,  

## 2015-11-02 NOTE — ED Notes (Signed)
Dressing applied to right buttock region, pt tolerated well,

## 2015-11-02 NOTE — ED Notes (Signed)
MD at bedside. 

## 2015-11-02 NOTE — ED Provider Notes (Signed)
Patient signed out to me to follow-up on CT scan. Patient thought he was experiencing rectal bleeding, however, examination revealed a lesion on his gluteal area. CT scan does not show any evidence of fistula or perirectal abscess. This appears to be an isolated soft tissue skin lesion that is actively draining. He will be placed on antibiotics and is appropriate for outpatient monitoring. Patient has not gone to dialysis for 6 days but does not appear to be volume overloaded and electrolytes are not abnormal. He is scheduled for dialysis later today.   Orpah Greek, MD 11/02/15 [phone]

## 2015-11-03 DIAGNOSIS — N186 End stage renal disease: Secondary | ICD-10-CM | POA: Diagnosis not present

## 2015-11-03 DIAGNOSIS — Z992 Dependence on renal dialysis: Secondary | ICD-10-CM | POA: Diagnosis not present

## 2015-11-03 LAB — RPR: RPR: NONREACTIVE

## 2015-11-05 DIAGNOSIS — Z992 Dependence on renal dialysis: Secondary | ICD-10-CM | POA: Diagnosis not present

## 2015-11-05 DIAGNOSIS — N186 End stage renal disease: Secondary | ICD-10-CM | POA: Diagnosis not present

## 2015-11-05 DIAGNOSIS — D509 Iron deficiency anemia, unspecified: Secondary | ICD-10-CM | POA: Diagnosis not present

## 2015-11-05 DIAGNOSIS — T82898A Other specified complication of vascular prosthetic devices, implants and grafts, initial encounter: Secondary | ICD-10-CM | POA: Diagnosis not present

## 2015-11-05 DIAGNOSIS — N2581 Secondary hyperparathyroidism of renal origin: Secondary | ICD-10-CM | POA: Diagnosis not present

## 2015-11-05 DIAGNOSIS — E11649 Type 2 diabetes mellitus with hypoglycemia without coma: Secondary | ICD-10-CM | POA: Diagnosis not present

## 2015-11-05 DIAGNOSIS — E162 Hypoglycemia, unspecified: Secondary | ICD-10-CM | POA: Diagnosis not present

## 2015-11-06 LAB — CULTURE, BLOOD (ROUTINE X 2)
Culture: NO GROWTH
Culture: NO GROWTH

## 2015-11-07 DIAGNOSIS — E162 Hypoglycemia, unspecified: Secondary | ICD-10-CM | POA: Diagnosis not present

## 2015-11-07 DIAGNOSIS — N186 End stage renal disease: Secondary | ICD-10-CM | POA: Diagnosis not present

## 2015-11-07 DIAGNOSIS — D509 Iron deficiency anemia, unspecified: Secondary | ICD-10-CM | POA: Diagnosis not present

## 2015-11-07 DIAGNOSIS — N2581 Secondary hyperparathyroidism of renal origin: Secondary | ICD-10-CM | POA: Diagnosis not present

## 2015-11-07 DIAGNOSIS — Z992 Dependence on renal dialysis: Secondary | ICD-10-CM | POA: Diagnosis not present

## 2015-11-07 DIAGNOSIS — T82898A Other specified complication of vascular prosthetic devices, implants and grafts, initial encounter: Secondary | ICD-10-CM | POA: Diagnosis not present

## 2015-11-09 DIAGNOSIS — D509 Iron deficiency anemia, unspecified: Secondary | ICD-10-CM | POA: Diagnosis not present

## 2015-11-09 DIAGNOSIS — N186 End stage renal disease: Secondary | ICD-10-CM | POA: Diagnosis not present

## 2015-11-09 DIAGNOSIS — T82898A Other specified complication of vascular prosthetic devices, implants and grafts, initial encounter: Secondary | ICD-10-CM | POA: Diagnosis not present

## 2015-11-09 DIAGNOSIS — E162 Hypoglycemia, unspecified: Secondary | ICD-10-CM | POA: Diagnosis not present

## 2015-11-09 DIAGNOSIS — N2581 Secondary hyperparathyroidism of renal origin: Secondary | ICD-10-CM | POA: Diagnosis not present

## 2015-11-09 DIAGNOSIS — Z992 Dependence on renal dialysis: Secondary | ICD-10-CM | POA: Diagnosis not present

## 2015-11-12 DIAGNOSIS — E119 Type 2 diabetes mellitus without complications: Secondary | ICD-10-CM | POA: Diagnosis not present

## 2015-11-12 DIAGNOSIS — N2581 Secondary hyperparathyroidism of renal origin: Secondary | ICD-10-CM | POA: Diagnosis not present

## 2015-11-12 DIAGNOSIS — T82898A Other specified complication of vascular prosthetic devices, implants and grafts, initial encounter: Secondary | ICD-10-CM | POA: Diagnosis not present

## 2015-11-12 DIAGNOSIS — D509 Iron deficiency anemia, unspecified: Secondary | ICD-10-CM | POA: Diagnosis not present

## 2015-11-12 DIAGNOSIS — Z992 Dependence on renal dialysis: Secondary | ICD-10-CM | POA: Diagnosis not present

## 2015-11-12 DIAGNOSIS — E162 Hypoglycemia, unspecified: Secondary | ICD-10-CM | POA: Diagnosis not present

## 2015-11-12 DIAGNOSIS — N186 End stage renal disease: Secondary | ICD-10-CM | POA: Diagnosis not present

## 2015-11-14 DIAGNOSIS — T82898A Other specified complication of vascular prosthetic devices, implants and grafts, initial encounter: Secondary | ICD-10-CM | POA: Diagnosis not present

## 2015-11-14 DIAGNOSIS — D509 Iron deficiency anemia, unspecified: Secondary | ICD-10-CM | POA: Diagnosis not present

## 2015-11-14 DIAGNOSIS — Z992 Dependence on renal dialysis: Secondary | ICD-10-CM | POA: Diagnosis not present

## 2015-11-14 DIAGNOSIS — N186 End stage renal disease: Secondary | ICD-10-CM | POA: Diagnosis not present

## 2015-11-14 DIAGNOSIS — N2581 Secondary hyperparathyroidism of renal origin: Secondary | ICD-10-CM | POA: Diagnosis not present

## 2015-11-14 DIAGNOSIS — E162 Hypoglycemia, unspecified: Secondary | ICD-10-CM | POA: Diagnosis not present

## 2015-11-16 DIAGNOSIS — N186 End stage renal disease: Secondary | ICD-10-CM | POA: Diagnosis not present

## 2015-11-16 DIAGNOSIS — E162 Hypoglycemia, unspecified: Secondary | ICD-10-CM | POA: Diagnosis not present

## 2015-11-16 DIAGNOSIS — N2581 Secondary hyperparathyroidism of renal origin: Secondary | ICD-10-CM | POA: Diagnosis not present

## 2015-11-16 DIAGNOSIS — T82898A Other specified complication of vascular prosthetic devices, implants and grafts, initial encounter: Secondary | ICD-10-CM | POA: Diagnosis not present

## 2015-11-16 DIAGNOSIS — Z992 Dependence on renal dialysis: Secondary | ICD-10-CM | POA: Diagnosis not present

## 2015-11-16 DIAGNOSIS — D509 Iron deficiency anemia, unspecified: Secondary | ICD-10-CM | POA: Diagnosis not present

## 2015-11-19 DIAGNOSIS — E162 Hypoglycemia, unspecified: Secondary | ICD-10-CM | POA: Diagnosis not present

## 2015-11-19 DIAGNOSIS — D509 Iron deficiency anemia, unspecified: Secondary | ICD-10-CM | POA: Diagnosis not present

## 2015-11-19 DIAGNOSIS — N2581 Secondary hyperparathyroidism of renal origin: Secondary | ICD-10-CM | POA: Diagnosis not present

## 2015-11-19 DIAGNOSIS — Z992 Dependence on renal dialysis: Secondary | ICD-10-CM | POA: Diagnosis not present

## 2015-11-19 DIAGNOSIS — N186 End stage renal disease: Secondary | ICD-10-CM | POA: Diagnosis not present

## 2015-11-19 DIAGNOSIS — T82898A Other specified complication of vascular prosthetic devices, implants and grafts, initial encounter: Secondary | ICD-10-CM | POA: Diagnosis not present

## 2015-11-20 DIAGNOSIS — M545 Low back pain: Secondary | ICD-10-CM | POA: Diagnosis not present

## 2015-11-20 DIAGNOSIS — N289 Disorder of kidney and ureter, unspecified: Secondary | ICD-10-CM | POA: Diagnosis not present

## 2015-11-20 DIAGNOSIS — R938 Abnormal findings on diagnostic imaging of other specified body structures: Secondary | ICD-10-CM | POA: Diagnosis not present

## 2015-11-20 DIAGNOSIS — Z760 Encounter for issue of repeat prescription: Secondary | ICD-10-CM | POA: Diagnosis not present

## 2015-11-21 ENCOUNTER — Other Ambulatory Visit (HOSPITAL_COMMUNITY): Payer: Self-pay | Admitting: Family

## 2015-11-21 DIAGNOSIS — N289 Disorder of kidney and ureter, unspecified: Secondary | ICD-10-CM

## 2015-11-22 DIAGNOSIS — Z992 Dependence on renal dialysis: Secondary | ICD-10-CM | POA: Diagnosis not present

## 2015-11-22 DIAGNOSIS — N2581 Secondary hyperparathyroidism of renal origin: Secondary | ICD-10-CM | POA: Diagnosis not present

## 2015-11-22 DIAGNOSIS — E162 Hypoglycemia, unspecified: Secondary | ICD-10-CM | POA: Diagnosis not present

## 2015-11-22 DIAGNOSIS — D509 Iron deficiency anemia, unspecified: Secondary | ICD-10-CM | POA: Diagnosis not present

## 2015-11-22 DIAGNOSIS — T82898A Other specified complication of vascular prosthetic devices, implants and grafts, initial encounter: Secondary | ICD-10-CM | POA: Diagnosis not present

## 2015-11-22 DIAGNOSIS — N186 End stage renal disease: Secondary | ICD-10-CM | POA: Diagnosis not present

## 2015-11-22 DIAGNOSIS — T82868A Thrombosis of vascular prosthetic devices, implants and grafts, initial encounter: Secondary | ICD-10-CM | POA: Diagnosis not present

## 2015-11-23 DIAGNOSIS — I871 Compression of vein: Secondary | ICD-10-CM | POA: Diagnosis not present

## 2015-11-23 DIAGNOSIS — E162 Hypoglycemia, unspecified: Secondary | ICD-10-CM | POA: Diagnosis not present

## 2015-11-23 DIAGNOSIS — N186 End stage renal disease: Secondary | ICD-10-CM | POA: Diagnosis not present

## 2015-11-23 DIAGNOSIS — D509 Iron deficiency anemia, unspecified: Secondary | ICD-10-CM | POA: Diagnosis not present

## 2015-11-23 DIAGNOSIS — T82858D Stenosis of vascular prosthetic devices, implants and grafts, subsequent encounter: Secondary | ICD-10-CM | POA: Diagnosis not present

## 2015-11-23 DIAGNOSIS — N2581 Secondary hyperparathyroidism of renal origin: Secondary | ICD-10-CM | POA: Diagnosis not present

## 2015-11-23 DIAGNOSIS — Z992 Dependence on renal dialysis: Secondary | ICD-10-CM | POA: Diagnosis not present

## 2015-11-23 DIAGNOSIS — T82898A Other specified complication of vascular prosthetic devices, implants and grafts, initial encounter: Secondary | ICD-10-CM | POA: Diagnosis not present

## 2015-11-24 DIAGNOSIS — N186 End stage renal disease: Secondary | ICD-10-CM | POA: Diagnosis not present

## 2015-11-24 DIAGNOSIS — Z992 Dependence on renal dialysis: Secondary | ICD-10-CM | POA: Diagnosis not present

## 2015-11-24 DIAGNOSIS — E162 Hypoglycemia, unspecified: Secondary | ICD-10-CM | POA: Diagnosis not present

## 2015-11-24 DIAGNOSIS — T82898A Other specified complication of vascular prosthetic devices, implants and grafts, initial encounter: Secondary | ICD-10-CM | POA: Diagnosis not present

## 2015-11-24 DIAGNOSIS — N2581 Secondary hyperparathyroidism of renal origin: Secondary | ICD-10-CM | POA: Diagnosis not present

## 2015-11-24 DIAGNOSIS — D509 Iron deficiency anemia, unspecified: Secondary | ICD-10-CM | POA: Diagnosis not present

## 2015-11-26 ENCOUNTER — Ambulatory Visit (HOSPITAL_COMMUNITY)
Admission: RE | Admit: 2015-11-26 | Discharge: 2015-11-26 | Disposition: A | Discharge status: Home / Self Care | Payer: Medicare Other | Source: Ambulatory Visit | Attending: Family | Admitting: Family

## 2015-11-26 DIAGNOSIS — D509 Iron deficiency anemia, unspecified: Secondary | ICD-10-CM | POA: Diagnosis not present

## 2015-11-26 DIAGNOSIS — T82898A Other specified complication of vascular prosthetic devices, implants and grafts, initial encounter: Secondary | ICD-10-CM | POA: Diagnosis not present

## 2015-11-26 DIAGNOSIS — N289 Disorder of kidney and ureter, unspecified: Secondary | ICD-10-CM

## 2015-11-26 DIAGNOSIS — N2581 Secondary hyperparathyroidism of renal origin: Secondary | ICD-10-CM | POA: Diagnosis not present

## 2015-11-26 DIAGNOSIS — E162 Hypoglycemia, unspecified: Secondary | ICD-10-CM | POA: Diagnosis not present

## 2015-11-26 DIAGNOSIS — Z992 Dependence on renal dialysis: Secondary | ICD-10-CM | POA: Diagnosis not present

## 2015-11-26 DIAGNOSIS — N186 End stage renal disease: Secondary | ICD-10-CM | POA: Diagnosis not present

## 2015-11-27 ENCOUNTER — Ambulatory Visit (HOSPITAL_COMMUNITY): Payer: Medicare Other

## 2015-11-27 ENCOUNTER — Other Ambulatory Visit: Payer: Self-pay | Admitting: Vascular Surgery

## 2015-11-27 DIAGNOSIS — N186 End stage renal disease: Secondary | ICD-10-CM

## 2015-11-27 DIAGNOSIS — Z01810 Encounter for preprocedural cardiovascular examination: Secondary | ICD-10-CM

## 2015-11-30 ENCOUNTER — Emergency Department (HOSPITAL_COMMUNITY)
Admission: EM | Admit: 2015-11-30 | Discharge: 2015-11-30 | Disposition: A | Discharge status: Home / Self Care | Payer: Medicare Other | Attending: Emergency Medicine | Admitting: Emergency Medicine

## 2015-11-30 ENCOUNTER — Other Ambulatory Visit (HOSPITAL_COMMUNITY): Payer: Self-pay | Admitting: Family

## 2015-11-30 ENCOUNTER — Encounter (HOSPITAL_COMMUNITY): Payer: Self-pay | Admitting: Emergency Medicine

## 2015-11-30 ENCOUNTER — Ambulatory Visit (HOSPITAL_COMMUNITY)
Admission: RE | Admit: 2015-11-30 | Discharge: 2015-11-30 | Disposition: A | Discharge status: Home / Self Care | Payer: Medicare Other | Source: Ambulatory Visit | Attending: Family | Admitting: Family

## 2015-11-30 ENCOUNTER — Other Ambulatory Visit: Payer: Self-pay

## 2015-11-30 ENCOUNTER — Encounter (HOSPITAL_COMMUNITY): Payer: Self-pay

## 2015-11-30 ENCOUNTER — Ambulatory Visit (HOSPITAL_COMMUNITY): Admission: RE | Admit: 2015-11-30 | Payer: Medicare Other | Source: Ambulatory Visit

## 2015-11-30 DIAGNOSIS — Z992 Dependence on renal dialysis: Secondary | ICD-10-CM | POA: Diagnosis not present

## 2015-11-30 DIAGNOSIS — E162 Hypoglycemia, unspecified: Secondary | ICD-10-CM | POA: Diagnosis not present

## 2015-11-30 DIAGNOSIS — I951 Orthostatic hypotension: Secondary | ICD-10-CM | POA: Diagnosis not present

## 2015-11-30 DIAGNOSIS — D509 Iron deficiency anemia, unspecified: Secondary | ICD-10-CM | POA: Diagnosis not present

## 2015-11-30 DIAGNOSIS — N289 Disorder of kidney and ureter, unspecified: Secondary | ICD-10-CM | POA: Insufficient documentation

## 2015-11-30 DIAGNOSIS — I129 Hypertensive chronic kidney disease with stage 1 through stage 4 chronic kidney disease, or unspecified chronic kidney disease: Secondary | ICD-10-CM | POA: Diagnosis not present

## 2015-11-30 DIAGNOSIS — N184 Chronic kidney disease, stage 4 (severe): Secondary | ICD-10-CM | POA: Diagnosis not present

## 2015-11-30 DIAGNOSIS — Z792 Long term (current) use of antibiotics: Secondary | ICD-10-CM | POA: Diagnosis not present

## 2015-11-30 DIAGNOSIS — Z79899 Other long term (current) drug therapy: Secondary | ICD-10-CM | POA: Insufficient documentation

## 2015-11-30 DIAGNOSIS — N2889 Other specified disorders of kidney and ureter: Secondary | ICD-10-CM | POA: Diagnosis not present

## 2015-11-30 DIAGNOSIS — N261 Atrophy of kidney (terminal): Secondary | ICD-10-CM | POA: Insufficient documentation

## 2015-11-30 DIAGNOSIS — N2581 Secondary hyperparathyroidism of renal origin: Secondary | ICD-10-CM | POA: Diagnosis not present

## 2015-11-30 DIAGNOSIS — R42 Dizziness and giddiness: Secondary | ICD-10-CM | POA: Diagnosis not present

## 2015-11-30 DIAGNOSIS — T82898A Other specified complication of vascular prosthetic devices, implants and grafts, initial encounter: Secondary | ICD-10-CM | POA: Diagnosis not present

## 2015-11-30 DIAGNOSIS — E1122 Type 2 diabetes mellitus with diabetic chronic kidney disease: Secondary | ICD-10-CM | POA: Diagnosis not present

## 2015-11-30 DIAGNOSIS — N186 End stage renal disease: Secondary | ICD-10-CM | POA: Diagnosis not present

## 2015-11-30 LAB — CBC WITH DIFFERENTIAL/PLATELET
BASOS ABS: 0 10*3/uL (ref 0.0–0.1)
BASOS PCT: 0 %
EOS PCT: 1 %
Eosinophils Absolute: 0.1 10*3/uL (ref 0.0–0.7)
HCT: 43.9 % (ref 39.0–52.0)
Hemoglobin: 14.9 g/dL (ref 13.0–17.0)
LYMPHS PCT: 21 %
Lymphs Abs: 2.3 10*3/uL (ref 0.7–4.0)
MCH: 30.7 pg (ref 26.0–34.0)
MCHC: 33.9 g/dL (ref 30.0–36.0)
MCV: 90.5 fL (ref 78.0–100.0)
MONO ABS: 0.9 10*3/uL (ref 0.1–1.0)
Monocytes Relative: 8 %
NEUTROS ABS: 7.7 10*3/uL (ref 1.7–7.7)
Neutrophils Relative %: 70 %
Platelets: 153 10*3/uL (ref 150–400)
RBC: 4.85 MIL/uL (ref 4.22–5.81)
RDW: 15.5 % (ref 11.5–15.5)
WBC: 11 10*3/uL — AB (ref 4.0–10.5)

## 2015-11-30 LAB — BASIC METABOLIC PANEL
Anion gap: 12 (ref 5–15)
BUN: 44 mg/dL — AB (ref 6–20)
CHLORIDE: 95 mmol/L — AB (ref 101–111)
CO2: 25 mmol/L (ref 22–32)
CREATININE: 14.53 mg/dL — AB (ref 0.61–1.24)
Calcium: 9.1 mg/dL (ref 8.9–10.3)
GFR calc Af Amer: 4 mL/min — ABNORMAL LOW (ref >60)
GFR calc non Af Amer: 3 mL/min — ABNORMAL LOW (ref >60)
GLUCOSE: 80 mg/dL (ref 65–99)
Potassium: 4.7 mmol/L (ref 3.5–5.1)
SODIUM: 132 mmol/L — AB (ref 135–145)

## 2015-11-30 MED ORDER — SODIUM CHLORIDE 0.9 % IV BOLUS (SEPSIS): 500.0000 mL | Freq: Once | INTRAVENOUS | Status: DC

## 2015-11-30 MED ORDER — SODIUM CHLORIDE 0.9 % IV SOLN
Freq: Once | INTRAVENOUS | Status: AC
  Administered 2015-11-30: 20:00:00 via INTRAVENOUS

## 2015-11-30 NOTE — ED Notes (Signed)
Pt given water and crackers as no meals available

## 2015-11-30 NOTE — ED Notes (Signed)
Clarified with PA regarding NS bolus

## 2015-11-30 NOTE — ED Notes (Signed)
Pt had dialysis today, followed by a MRI at [facility]-  After MRI, pt reported that he was dizzy and was encouraged to go to the ED-  He chose to come to ED here and left [facility] due to ride issues He is conversant, NAD and request TV control that he might watch TV

## 2015-11-30 NOTE — ED Notes (Signed)
Pt reports that he would like his IV removed

## 2015-11-30 NOTE — ED Notes (Signed)
Pt reports that he needs  More to eat and needs a pain pill. MD informed- She reports that he is ready to be discharged.

## 2015-11-30 NOTE — ED Notes (Signed)
IV established by ultrasound by Ron, RN- pt laughing and requesting meal

## 2015-11-30 NOTE — ED Provider Notes (Signed)
[facility] DEPT Provider Note   CSN: [id number] Arrival date & time: 11/30/15  [name]     History   Chief Complaint Chief Complaint  Patient presents with  . Dizziness    HPI Jack Irwin is a 50 y.o. male who presents To the emergency department for lightheadedness. He is a dialysis patient and was dialyzed today. He states that they took off more fluid than normal and he has been very dizzy ever since. He states it is worse with standing. He denies vertiginous symptoms. Patient denies abdominal pain currently. Patient states that he is very hungry and has not eaten today. Review of the patient's flow sheet shows that his systolic pressures are usually between 120 and 130.  HPI  Past Medical History:  Diagnosis Date  . Anemia   . Arthritis   . BPH (benign prostatic hyperplasia)   . Chronic back pain   . Chronic kidney disease   . Chronic kidney disease (CKD), stage IV (severe) ([place])   . Constipation   . Constipation   . Decubitus ulcer    of 2nd toes of both feet.  . Diabetes mellitus   . Difficulty walking   . DVT (deep venous thrombosis) ([place])   . Edema leg   . Gout   . Hypertension   . Lack of coordination   . Neuropathy ([place])   . Neuropathy, diabetic ([place])   . Physical deconditioning   . Poor balance   . Renal insufficiency   . Venous (peripheral) insufficiency     Patient Active Problem List   Diagnosis Date Noted  . Nausea & vomiting 11/17/2014  . Uremia syndrome 11/17/2014  . Uremia 11/17/2014  . Thrombocytopenia ([place]) 02/21/2014  . Morbid obesity ([place]) 03/30/2013  . RBBB 03/29/2013  . Hyperkalemia 03/28/2013  . Leukocytosis 03/28/2013  . Nausea vomiting and diarrhea 03/28/2013  . BBB (bundle branch block) 03/28/2013  . Hypertension   . ESRD on dialysis ([place])   . Lack of coordination 04/15/2012  . Muscle weakness (generalized) 04/15/2012  . Arthritis, gouty 04/15/2012  . Difficulty in walking(719.7) 08/18/2011  . Weakness of both legs  08/18/2011  . Poor balance 08/18/2011    Past Surgical History:  Procedure Laterality Date  . AV FISTULA PLACEMENT Left 03/09/2014   Procedure: INSERTION OF ARTERIOVENOUS (AV) GORE-TEX GRAFT ARM;  Surgeon: Angelia Mould, MD;  Location: [facility];  Service: Vascular;  Laterality: Left;  . CYST EXCISION Right    cyst removed on thumb 2001  . INSERTION OF DIALYSIS CATHETER    . INSERTION OF DIALYSIS CATHETER Left 03/09/2014   Procedure: INSERTION OF DIALYSIS CATHETER;  Surgeon: Angelia Mould, MD;  Location: [facility];  Service: Vascular;  Laterality: Left;  . none         Home Medications    Prior to Admission medications   Medication Sig Start Date End Date Taking? Authorizing Provider  calcium acetate (PHOSLO) 667 MG capsule Take 1 capsule by mouth 3 (three) times daily. 04/11/14   Historical Provider, MD  colchicine 0.6 MG tablet 1/2 tab po 2 times /week 08/15/15   Lily Kocher, PA-C  diclofenac sodium (VOLTAREN) 1 % GEL Apply to left wrist and hand three times daily Patient not taking: Reported on 08/15/2015 04/13/14   Lily Kocher, PA-C  doxycycline (VIBRAMYCIN) 100 MG capsule Take 1 capsule (100 mg total) by mouth 2 (two) times daily. 11/02/15   Orpah Greek, MD  gabapentin (NEURONTIN) 100 MG capsule Take 100 mg  by mouth daily.  07/11/14   Historical Provider, MD  multivitamin (RENA-VIT) TABS tablet Take 1 tablet by mouth daily.    Historical Provider, MD  RENVELA 800 MG tablet Take 800 mg by mouth 3 (three) times daily with meals.  07/18/14   Historical Provider, MD  sildenafil (VIAGRA) 50 MG tablet Take 50 mg by mouth daily as needed for erectile dysfunction.    Historical Provider, MD  Skin Protectants, Misc. (EUCERIN) cream Apply 1 application topically 2 (two) times daily.    Historical Provider, MD  triamcinolone cream (KENALOG) 0.1 % Apply 1 application topically 2 (two) times daily.    Historical Provider, MD  ULORIC 80 MG TABS Take 80 mg by mouth daily. 09/26/14    Historical Provider, MD    Family History Family History  Problem Relation Age of Onset  . Diabetes Mother   . Hypertension Mother   . Heart failure Mother   . Hyperlipidemia Mother   . Heart attack Mother   . Cancer Father   . Diabetes Father   . Hypertension Father   . Hyperlipidemia Father     Social History Social History  Substance Use Topics  . Smoking status: Never Smoker  . Smokeless tobacco: Never Used  . Alcohol use No     Allergies   Review of patient's allergies indicates no known allergies.   Review of Systems Review of Systems Ten systems reviewed and are negative for acute change, except as noted in the HPI.    Physical Exam Updated Vital Signs BP (!) 120/101 (BP Location: Right Arm)   Pulse 93   Temp 97.5 F (36.4 C) (Oral)   Resp 23   Ht 5\' 11"  (1.803 m)   Wt 119.3 kg   SpO2 98%   BMI 36.68 kg/m   Physical Exam  Constitutional: He appears well-developed and well-nourished. No distress.  HENT:  Head: Normocephalic and atraumatic.  Eyes: Conjunctivae are normal. No scleral icterus.  Neck: Normal range of motion. Neck supple.  Cardiovascular: Normal rate, regular rhythm and normal heart sounds.   Pulmonary/Chest: Effort normal and breath sounds normal. No respiratory distress.  Abdominal: Soft. There is no tenderness.  Musculoskeletal: He exhibits no edema.  Neurological: He is alert.  Skin: Skin is warm and dry. He is not diaphoretic.  Psychiatric: His behavior is normal.  Nursing note and vitals reviewed.    ED Treatments / Results  Labs (all labs ordered are listed, but only abnormal results are displayed) Labs Reviewed  BASIC METABOLIC PANEL - Abnormal; Notable for the following:       Result Value   Sodium 132 (*)    Chloride 95 (*)    BUN 44 (*)    Creatinine, Ser 14.53 (*)    GFR calc non Af Amer 3 (*)    GFR calc Af Amer 4 (*)    All other components within normal limits  CBC WITH DIFFERENTIAL/PLATELET - Abnormal;  Notable for the following:    WBC 11.0 (*)    All other components within normal limits    EKG  EKG Interpretation None     ED ECG REPORT   Date: 12/04/2015  Rate: 88  Rhythm: normal sinus rhythm  QRS Axis: normal  Intervals: normal  ST/T Wave abnormalities: early repolarization  Conduction Disutrbances:right bundle branch block  Narrative Interpretation:   Old EKG Reviewed: unchanged  I have personally reviewed the EKG tracing and agree with the computerized printout as noted.   Radiology  No results found.  Procedures Procedures (including critical care time)  Medications Ordered in ED Medications  0.9 %  sodium chloride infusion ( Intravenous Stopped 11/30/15 2035)     Initial Impression / Assessment and Plan / ED Course  I have reviewed the triage vital signs and the nursing notes.  Pertinent labs & imaging results that were available during my care of the patient were reviewed by me and considered in my medical decision making (see chart for details).  Clinical Course   Patient with Pos orthostatic VS. He is given 500 ml normal saline with improvement of his orthstasis and ambulatory in the ED. He states that his dialysis center did take off more fluid than normal. His ekg is unchanged and labs at basline. The patient appears reasonably screened and/or stabilized for discharge and I doubt any other medical condition or other [facility] requiring further screening, evaluation, or treatment in the ED at this time prior to discharge.    Final Clinical Impressions(s) / ED Diagnoses   Final diagnoses:  Orthostatic hypotension    New Prescriptions Discharge Medication List as of 11/30/2015  9:57 PM       Margarita Mail, PA-C 12/04/15 [place] Liu, MD 12/04/15 1747

## 2015-11-30 NOTE — ED Triage Notes (Signed)
Pt c/o dizziness since leaving dialysis today at 12 noon. Pt also c/o abd pain. Denies n/v/d.

## 2015-11-30 NOTE — Discharge Instructions (Signed)
Drink a glass or 2 of water when you get home.   SEEK IMMEDIATE MEDICAL CARE IF:  You faint after standing. You have chest pain.  You have difficulty breathing.   You lose feeling or movement in your arms or legs.   You have slurred speech or difficulty talking, or you are unable to talk.

## 2015-12-03 DIAGNOSIS — N2581 Secondary hyperparathyroidism of renal origin: Secondary | ICD-10-CM | POA: Diagnosis not present

## 2015-12-03 DIAGNOSIS — E162 Hypoglycemia, unspecified: Secondary | ICD-10-CM | POA: Diagnosis not present

## 2015-12-03 DIAGNOSIS — D509 Iron deficiency anemia, unspecified: Secondary | ICD-10-CM | POA: Diagnosis not present

## 2015-12-03 DIAGNOSIS — Z992 Dependence on renal dialysis: Secondary | ICD-10-CM | POA: Diagnosis not present

## 2015-12-03 DIAGNOSIS — T82898A Other specified complication of vascular prosthetic devices, implants and grafts, initial encounter: Secondary | ICD-10-CM | POA: Diagnosis not present

## 2015-12-03 DIAGNOSIS — N186 End stage renal disease: Secondary | ICD-10-CM | POA: Diagnosis not present

## 2015-12-04 DIAGNOSIS — G8929 Other chronic pain: Secondary | ICD-10-CM | POA: Diagnosis not present

## 2015-12-04 DIAGNOSIS — N186 End stage renal disease: Secondary | ICD-10-CM | POA: Diagnosis not present

## 2015-12-04 DIAGNOSIS — M25561 Pain in right knee: Secondary | ICD-10-CM | POA: Diagnosis not present

## 2015-12-04 DIAGNOSIS — Z7189 Other specified counseling: Secondary | ICD-10-CM | POA: Diagnosis not present

## 2015-12-04 DIAGNOSIS — Z992 Dependence on renal dialysis: Secondary | ICD-10-CM | POA: Diagnosis not present

## 2015-12-04 DIAGNOSIS — R031 Nonspecific low blood-pressure reading: Secondary | ICD-10-CM | POA: Diagnosis not present

## 2015-12-04 DIAGNOSIS — M25562 Pain in left knee: Secondary | ICD-10-CM | POA: Diagnosis not present

## 2015-12-06 DIAGNOSIS — R112 Nausea with vomiting, unspecified: Secondary | ICD-10-CM | POA: Diagnosis not present

## 2015-12-06 DIAGNOSIS — N2581 Secondary hyperparathyroidism of renal origin: Secondary | ICD-10-CM | POA: Diagnosis not present

## 2015-12-06 DIAGNOSIS — Z992 Dependence on renal dialysis: Secondary | ICD-10-CM | POA: Diagnosis not present

## 2015-12-06 DIAGNOSIS — D509 Iron deficiency anemia, unspecified: Secondary | ICD-10-CM | POA: Diagnosis not present

## 2015-12-06 DIAGNOSIS — N186 End stage renal disease: Secondary | ICD-10-CM | POA: Diagnosis not present

## 2015-12-06 DIAGNOSIS — E11649 Type 2 diabetes mellitus with hypoglycemia without coma: Secondary | ICD-10-CM | POA: Diagnosis not present

## 2015-12-06 DIAGNOSIS — E162 Hypoglycemia, unspecified: Secondary | ICD-10-CM | POA: Diagnosis not present

## 2015-12-07 DIAGNOSIS — E162 Hypoglycemia, unspecified: Secondary | ICD-10-CM | POA: Diagnosis not present

## 2015-12-07 DIAGNOSIS — D509 Iron deficiency anemia, unspecified: Secondary | ICD-10-CM | POA: Diagnosis not present

## 2015-12-07 DIAGNOSIS — R112 Nausea with vomiting, unspecified: Secondary | ICD-10-CM | POA: Diagnosis not present

## 2015-12-07 DIAGNOSIS — N186 End stage renal disease: Secondary | ICD-10-CM | POA: Diagnosis not present

## 2015-12-07 DIAGNOSIS — N2581 Secondary hyperparathyroidism of renal origin: Secondary | ICD-10-CM | POA: Diagnosis not present

## 2015-12-07 DIAGNOSIS — Z992 Dependence on renal dialysis: Secondary | ICD-10-CM | POA: Diagnosis not present

## 2015-12-12 ENCOUNTER — Other Ambulatory Visit (HOSPITAL_COMMUNITY)
Admission: RE | Admit: 2015-12-12 | Discharge: 2015-12-12 | Disposition: A | Discharge status: Home / Self Care | Payer: Medicare Other | Source: Ambulatory Visit | Attending: Nephrology | Admitting: Nephrology

## 2015-12-12 DIAGNOSIS — R112 Nausea with vomiting, unspecified: Secondary | ICD-10-CM | POA: Diagnosis not present

## 2015-12-12 DIAGNOSIS — Z992 Dependence on renal dialysis: Secondary | ICD-10-CM | POA: Diagnosis not present

## 2015-12-12 DIAGNOSIS — N2581 Secondary hyperparathyroidism of renal origin: Secondary | ICD-10-CM | POA: Diagnosis not present

## 2015-12-12 DIAGNOSIS — N186 End stage renal disease: Secondary | ICD-10-CM | POA: Diagnosis not present

## 2015-12-12 DIAGNOSIS — D509 Iron deficiency anemia, unspecified: Secondary | ICD-10-CM | POA: Diagnosis not present

## 2015-12-12 DIAGNOSIS — E162 Hypoglycemia, unspecified: Secondary | ICD-10-CM | POA: Diagnosis not present

## 2015-12-12 LAB — POTASSIUM: POTASSIUM: 5.7 mmol/L — AB (ref 3.5–5.1)

## 2015-12-13 DIAGNOSIS — D509 Iron deficiency anemia, unspecified: Secondary | ICD-10-CM | POA: Diagnosis not present

## 2015-12-13 DIAGNOSIS — N2581 Secondary hyperparathyroidism of renal origin: Secondary | ICD-10-CM | POA: Diagnosis not present

## 2015-12-13 DIAGNOSIS — Z992 Dependence on renal dialysis: Secondary | ICD-10-CM | POA: Diagnosis not present

## 2015-12-13 DIAGNOSIS — T82858A Stenosis of vascular prosthetic devices, implants and grafts, initial encounter: Secondary | ICD-10-CM | POA: Diagnosis not present

## 2015-12-13 DIAGNOSIS — T82868A Thrombosis of vascular prosthetic devices, implants and grafts, initial encounter: Secondary | ICD-10-CM | POA: Diagnosis not present

## 2015-12-13 DIAGNOSIS — E162 Hypoglycemia, unspecified: Secondary | ICD-10-CM | POA: Diagnosis not present

## 2015-12-13 DIAGNOSIS — N186 End stage renal disease: Secondary | ICD-10-CM | POA: Diagnosis not present

## 2015-12-13 DIAGNOSIS — R112 Nausea with vomiting, unspecified: Secondary | ICD-10-CM | POA: Diagnosis not present

## 2015-12-14 ENCOUNTER — Encounter: Payer: Self-pay | Admitting: Vascular Surgery

## 2015-12-14 DIAGNOSIS — Z992 Dependence on renal dialysis: Secondary | ICD-10-CM | POA: Diagnosis not present

## 2015-12-14 DIAGNOSIS — E162 Hypoglycemia, unspecified: Secondary | ICD-10-CM | POA: Diagnosis not present

## 2015-12-14 DIAGNOSIS — N186 End stage renal disease: Secondary | ICD-10-CM | POA: Diagnosis not present

## 2015-12-14 DIAGNOSIS — N2581 Secondary hyperparathyroidism of renal origin: Secondary | ICD-10-CM | POA: Diagnosis not present

## 2015-12-14 DIAGNOSIS — R112 Nausea with vomiting, unspecified: Secondary | ICD-10-CM | POA: Diagnosis not present

## 2015-12-14 DIAGNOSIS — D509 Iron deficiency anemia, unspecified: Secondary | ICD-10-CM | POA: Diagnosis not present

## 2015-12-17 DIAGNOSIS — R112 Nausea with vomiting, unspecified: Secondary | ICD-10-CM | POA: Diagnosis not present

## 2015-12-17 DIAGNOSIS — E162 Hypoglycemia, unspecified: Secondary | ICD-10-CM | POA: Diagnosis not present

## 2015-12-17 DIAGNOSIS — Z992 Dependence on renal dialysis: Secondary | ICD-10-CM | POA: Diagnosis not present

## 2015-12-17 DIAGNOSIS — N186 End stage renal disease: Secondary | ICD-10-CM | POA: Diagnosis not present

## 2015-12-17 DIAGNOSIS — D509 Iron deficiency anemia, unspecified: Secondary | ICD-10-CM | POA: Diagnosis not present

## 2015-12-17 DIAGNOSIS — N2581 Secondary hyperparathyroidism of renal origin: Secondary | ICD-10-CM | POA: Diagnosis not present

## 2015-12-19 DIAGNOSIS — N2581 Secondary hyperparathyroidism of renal origin: Secondary | ICD-10-CM | POA: Diagnosis not present

## 2015-12-19 DIAGNOSIS — T82858A Stenosis of vascular prosthetic devices, implants and grafts, initial encounter: Secondary | ICD-10-CM | POA: Diagnosis not present

## 2015-12-19 DIAGNOSIS — E162 Hypoglycemia, unspecified: Secondary | ICD-10-CM | POA: Diagnosis not present

## 2015-12-19 DIAGNOSIS — Z992 Dependence on renal dialysis: Secondary | ICD-10-CM | POA: Diagnosis not present

## 2015-12-19 DIAGNOSIS — N186 End stage renal disease: Secondary | ICD-10-CM | POA: Diagnosis not present

## 2015-12-19 DIAGNOSIS — R112 Nausea with vomiting, unspecified: Secondary | ICD-10-CM | POA: Diagnosis not present

## 2015-12-19 DIAGNOSIS — D509 Iron deficiency anemia, unspecified: Secondary | ICD-10-CM | POA: Diagnosis not present

## 2015-12-19 DIAGNOSIS — I871 Compression of vein: Secondary | ICD-10-CM | POA: Diagnosis not present

## 2015-12-20 ENCOUNTER — Ambulatory Visit (HOSPITAL_COMMUNITY)
Admission: RE | Admit: 2015-12-20 | Discharge: 2015-12-20 | Disposition: A | Discharge status: Home / Self Care | Payer: Medicare Other | Source: Ambulatory Visit | Attending: Vascular Surgery | Admitting: Vascular Surgery

## 2015-12-20 ENCOUNTER — Ambulatory Visit (INDEPENDENT_AMBULATORY_CARE_PROVIDER_SITE_OTHER)
Admission: RE | Admit: 2015-12-20 | Discharge: 2015-12-20 | Disposition: A | Discharge status: Home / Self Care | Payer: Medicare Other | Source: Ambulatory Visit | Attending: Vascular Surgery | Admitting: Vascular Surgery

## 2015-12-20 ENCOUNTER — Encounter: Payer: Self-pay | Admitting: Vascular Surgery

## 2015-12-20 ENCOUNTER — Ambulatory Visit (INDEPENDENT_AMBULATORY_CARE_PROVIDER_SITE_OTHER): Payer: Medicare Other | Admitting: Vascular Surgery

## 2015-12-20 VITALS — BP 131/89 | HR 81 | Temp 98.3°F | Resp 18 | Ht 71.0 in | Wt 266.0 lb

## 2015-12-20 DIAGNOSIS — N186 End stage renal disease: Secondary | ICD-10-CM

## 2015-12-20 DIAGNOSIS — Z01810 Encounter for preprocedural cardiovascular examination: Secondary | ICD-10-CM

## 2015-12-20 DIAGNOSIS — Z992 Dependence on renal dialysis: Secondary | ICD-10-CM | POA: Diagnosis not present

## 2015-12-20 NOTE — Progress Notes (Signed)
HISTORY AND PHYSICAL     CC:  In need of new access Referring Provider:  [facility]iance, Veronia Beets*  HPI: This is a 50 y.o. male who had a LUA AVG on  03/09/14 by Dr. Scot Dock.  The pt states that the dialysis center kept sticking it in the same place and made it clot.  The graft lasted for about 18 months. He states he has been to [facility] as well as [facility] for declot, but was unsuccessful.  He did have a tunneled dialysis catheter placed in [facility].  He presents today for evaluation for new access.  He dialyzes M/W/F in [place].  He is not on any blood thinners.  Past Medical History:  Diagnosis Date  . Anemia   . Arthritis   . BPH (benign prostatic hyperplasia)   . Chronic back pain   . Chronic kidney disease   . Chronic kidney disease (CKD), stage IV (severe) ([place])   . Constipation   . Constipation   . Decubitus ulcer    of 2nd toes of both feet.  . Diabetes mellitus   . Difficulty walking   . DVT (deep venous thrombosis) ([place])   . Edema leg   . Gout   . Hypertension   . Lack of coordination   . Neuropathy ([place])   . Neuropathy, diabetic ([place])   . Physical deconditioning   . Poor balance   . Renal insufficiency   . Venous (peripheral) insufficiency     Past Surgical History:  Procedure Laterality Date  . AV FISTULA PLACEMENT Left 03/09/2014   Procedure: INSERTION OF ARTERIOVENOUS (AV) GORE-TEX GRAFT ARM;  Surgeon: Angelia Mould, MD;  Location: [place];  Service: Vascular;  Laterality: Left;  . CYST EXCISION Right    cyst removed on thumb 2001  . INSERTION OF DIALYSIS CATHETER    . INSERTION OF DIALYSIS CATHETER Left 03/09/2014   Procedure: INSERTION OF DIALYSIS CATHETER;  Surgeon: Angelia Mould, MD;  Location: [facility] OR;  Service: Vascular;  Laterality: Left;  . none      No Known Allergies  Current Outpatient Prescriptions  Medication Sig Dispense Refill  . calcium acetate (PHOSLO) 667 MG capsule Take 1 capsule by mouth 3 (three) times  daily.    . colchicine 0.6 MG tablet 1/2 tab po 2 times /week 15 tablet 0  . gabapentin (NEURONTIN) 100 MG capsule Take 100 mg by mouth daily as needed.     . multivitamin (RENA-VIT) TABS tablet Take 1 tablet by mouth daily.    [name] RENVELA 800 MG tablet Take 800 mg by mouth 3 (three) times daily with meals.     . Skin Protectants, Misc. (EUCERIN) cream Apply 1 application topically 2 (two) times daily.    [name] triamcinolone cream (KENALOG) 0.1 % Apply 1 application topically 2 (two) times daily.    [name] ULORIC 80 MG TABS Take 80 mg by mouth daily.    . diclofenac sodium (VOLTAREN) 1 % GEL Apply to left wrist and hand three times daily (Patient not taking: Reported on 12/20/2015) 100 g 0  . doxycycline (VIBRAMYCIN) 100 MG capsule Take 1 capsule (100 mg total) by mouth 2 (two) times daily. (Patient not taking: Reported on 12/20/2015) 20 capsule 0  . sildenafil (VIAGRA) 50 MG tablet Take 50 mg by mouth daily as needed for erectile dysfunction.     No current facility-administered medications for this visit.     Family History  Problem Relation Age of Onset  .  Diabetes Mother   . Hypertension Mother   . Heart failure Mother   . Hyperlipidemia Mother   . Heart attack Mother   . Cancer Father   . Diabetes Father   . Hypertension Father   . Hyperlipidemia Father     Social History   Social History  . Marital status: Married    Spouse name: N/A  . Number of children: N/A  . Years of education: N/A   Occupational History  . Not on file.   Social History Main Topics  . Smoking status: Never Smoker  . Smokeless tobacco: Never Used  . Alcohol use No  . Drug use: No  . Sexual activity: Not on file   Other Topics Concern  . Not on file   Social History Narrative  . No narrative on file     REVIEW OF SYSTEMS:   [X]  denotes positive finding, [ ]  denotes negative finding Cardiac  Comments:  Chest pain or chest pressure:    Shortness of breath upon exertion:    Short of breath when  lying flat:    Irregular heart rhythm:        Vascular    Pain in calf, thigh, or hip brought on by ambulation:    Pain in feet at night that wakes you up from your sleep:  x   Blood clot in your veins: x Referring to his clotted AVG  Leg swelling:         Pulmonary    Oxygen at home:    Productive cough:     Wheezing:         Neurologic    Sudden weakness in arms or legs:     Sudden numbness in arms or legs:     Sudden onset of difficulty speaking or slurred speech:    Temporary loss of vision in one eye:     Problems with dizziness:         Gastrointestinal    Blood in stool:     Vomited blood:         Genitourinary    Burning when urinating:     Blood in urine:        Psychiatric    Major depression:         Hematologic    Bleeding problems:    Problems with blood clotting too easily:        Skin    Rashes or ulcers:        Constitutional    Fever or chills:      PHYSICAL EXAMINATION:  Vitals:   12/20/15 1543  BP: 131/89  Pulse: 81  Resp: 18  Temp: 98.3 F (36.8 C)   Body mass index is 37.1 kg/m.  General:  WDWN obese male in NAD; vital signs documented above Gait: Not observed HENT: WNL, normocephalic Pulmonary: normal non-labored breathing , without Rales, rhonchi,  wheezing Cardiac: regular HR, without  Murmurs, rubs or gallops; without carotid bruits Abdomen: obese Skin: without rashes Vascular Exam/Pulses:  Right Left  Radial 2+ (normal) Unable to palpate   Ulnar Unable to palpate Unable to palpate    Extremities: without ischemic changes, without Gangrene , without cellulitis; without open wounds;  Musculoskeletal: no muscle wasting or atrophy; LUA AVG without thrill or bruit Neurologic: A&O X 3;  No focal weakness or paresthesias are detected Psychiatric:  The pt has Normal affect.   Non-Invasive Vascular Imaging:    Upper extremity vein mapping 27/51/70: Left basilic  is not adequate.    Upper extremity arterial duplex for  dialysis access creation 12/20/15: Right Brachial:  0.39cm (T) Right Radial:  0.20cm (T) Right Ulnar:  0.21cm (T)  Pt meds includes: Statin:  No. Beta Blocker:  No. Aspirin:  No. ACEI:  No. ARB:  No. Other Antiplatelet/Anticoagulant:  No.    ASSESSMENT/PLAN:: 50 y.o. male with ESRD who dialyzes M/W/F in [place] with a clotted LUA AVG in need of new access   -pt will be scheduled for a new right arm fistula vs graft on January 22, 2016.  Will evaluate in the OR and if his basilic vein is adequate, will proceed with a right basilic vein transposition.  If it is not adequate, will proceed with an AV graft. -will not return to the left arm.  Intraoperative findings in 02/2014 revealed a high bifurcation of the brachial artery and even the brachial artery at the axillary level ws quite small.  The high brachial vein was 72mm.    Leontine Locket, PA-C [facility] [phone]  Clinic MD:  Pt seen and examined in conjunction with Dr. Oneida Alar  History and exam findings as above. Patient has a marginal but potentially usable right basilic vein for possible creation of a fistula. If at the time of operation the vein is inadequate we'll place a right arm AV graft. Risks benefits possible complications and procedure details were discussed with the patient regarding both procedures today. He currently is dialyzing via left sided catheter. He is scheduled for operation 01/22/2016.  Ruta Hinds, MD [facility] [place] Office: [phone] Pager: [phone]

## 2015-12-21 DIAGNOSIS — N2581 Secondary hyperparathyroidism of renal origin: Secondary | ICD-10-CM | POA: Diagnosis not present

## 2015-12-21 DIAGNOSIS — E162 Hypoglycemia, unspecified: Secondary | ICD-10-CM | POA: Diagnosis not present

## 2015-12-21 DIAGNOSIS — D509 Iron deficiency anemia, unspecified: Secondary | ICD-10-CM | POA: Diagnosis not present

## 2015-12-21 DIAGNOSIS — N186 End stage renal disease: Secondary | ICD-10-CM | POA: Diagnosis not present

## 2015-12-21 DIAGNOSIS — Z992 Dependence on renal dialysis: Secondary | ICD-10-CM | POA: Diagnosis not present

## 2015-12-21 DIAGNOSIS — R112 Nausea with vomiting, unspecified: Secondary | ICD-10-CM | POA: Diagnosis not present

## 2015-12-24 ENCOUNTER — Other Ambulatory Visit: Payer: Self-pay

## 2015-12-24 DIAGNOSIS — D509 Iron deficiency anemia, unspecified: Secondary | ICD-10-CM | POA: Diagnosis not present

## 2015-12-24 DIAGNOSIS — R112 Nausea with vomiting, unspecified: Secondary | ICD-10-CM | POA: Diagnosis not present

## 2015-12-24 DIAGNOSIS — Z992 Dependence on renal dialysis: Secondary | ICD-10-CM | POA: Diagnosis not present

## 2015-12-24 DIAGNOSIS — N2581 Secondary hyperparathyroidism of renal origin: Secondary | ICD-10-CM | POA: Diagnosis not present

## 2015-12-24 DIAGNOSIS — E162 Hypoglycemia, unspecified: Secondary | ICD-10-CM | POA: Diagnosis not present

## 2015-12-24 DIAGNOSIS — N186 End stage renal disease: Secondary | ICD-10-CM | POA: Diagnosis not present

## 2015-12-26 ENCOUNTER — Encounter: Payer: Self-pay | Admitting: Nephrology

## 2015-12-26 DIAGNOSIS — N2581 Secondary hyperparathyroidism of renal origin: Secondary | ICD-10-CM | POA: Diagnosis not present

## 2015-12-26 DIAGNOSIS — R112 Nausea with vomiting, unspecified: Secondary | ICD-10-CM | POA: Diagnosis not present

## 2015-12-26 DIAGNOSIS — D509 Iron deficiency anemia, unspecified: Secondary | ICD-10-CM | POA: Diagnosis not present

## 2015-12-26 DIAGNOSIS — E162 Hypoglycemia, unspecified: Secondary | ICD-10-CM | POA: Diagnosis not present

## 2015-12-26 DIAGNOSIS — Z992 Dependence on renal dialysis: Secondary | ICD-10-CM | POA: Diagnosis not present

## 2015-12-26 DIAGNOSIS — N186 End stage renal disease: Secondary | ICD-10-CM | POA: Diagnosis not present

## 2015-12-28 DIAGNOSIS — E162 Hypoglycemia, unspecified: Secondary | ICD-10-CM | POA: Diagnosis not present

## 2015-12-28 DIAGNOSIS — N2581 Secondary hyperparathyroidism of renal origin: Secondary | ICD-10-CM | POA: Diagnosis not present

## 2015-12-28 DIAGNOSIS — N186 End stage renal disease: Secondary | ICD-10-CM | POA: Diagnosis not present

## 2015-12-28 DIAGNOSIS — R112 Nausea with vomiting, unspecified: Secondary | ICD-10-CM | POA: Diagnosis not present

## 2015-12-28 DIAGNOSIS — Z992 Dependence on renal dialysis: Secondary | ICD-10-CM | POA: Diagnosis not present

## 2015-12-28 DIAGNOSIS — D509 Iron deficiency anemia, unspecified: Secondary | ICD-10-CM | POA: Diagnosis not present

## 2015-12-31 DIAGNOSIS — D509 Iron deficiency anemia, unspecified: Secondary | ICD-10-CM | POA: Diagnosis not present

## 2015-12-31 DIAGNOSIS — Z992 Dependence on renal dialysis: Secondary | ICD-10-CM | POA: Diagnosis not present

## 2015-12-31 DIAGNOSIS — N2581 Secondary hyperparathyroidism of renal origin: Secondary | ICD-10-CM | POA: Diagnosis not present

## 2015-12-31 DIAGNOSIS — E162 Hypoglycemia, unspecified: Secondary | ICD-10-CM | POA: Diagnosis not present

## 2015-12-31 DIAGNOSIS — N186 End stage renal disease: Secondary | ICD-10-CM | POA: Diagnosis not present

## 2015-12-31 DIAGNOSIS — R112 Nausea with vomiting, unspecified: Secondary | ICD-10-CM | POA: Diagnosis not present

## 2016-01-02 DIAGNOSIS — N2581 Secondary hyperparathyroidism of renal origin: Secondary | ICD-10-CM | POA: Diagnosis not present

## 2016-01-02 DIAGNOSIS — N186 End stage renal disease: Secondary | ICD-10-CM | POA: Diagnosis not present

## 2016-01-02 DIAGNOSIS — D509 Iron deficiency anemia, unspecified: Secondary | ICD-10-CM | POA: Diagnosis not present

## 2016-01-02 DIAGNOSIS — E162 Hypoglycemia, unspecified: Secondary | ICD-10-CM | POA: Diagnosis not present

## 2016-01-02 DIAGNOSIS — Z992 Dependence on renal dialysis: Secondary | ICD-10-CM | POA: Diagnosis not present

## 2016-01-02 DIAGNOSIS — R112 Nausea with vomiting, unspecified: Secondary | ICD-10-CM | POA: Diagnosis not present

## 2016-01-03 DIAGNOSIS — Z992 Dependence on renal dialysis: Secondary | ICD-10-CM | POA: Diagnosis not present

## 2016-01-03 DIAGNOSIS — N186 End stage renal disease: Secondary | ICD-10-CM | POA: Diagnosis not present

## 2016-01-04 DIAGNOSIS — E162 Hypoglycemia, unspecified: Secondary | ICD-10-CM | POA: Diagnosis not present

## 2016-01-04 DIAGNOSIS — E11649 Type 2 diabetes mellitus with hypoglycemia without coma: Secondary | ICD-10-CM | POA: Diagnosis not present

## 2016-01-04 DIAGNOSIS — D509 Iron deficiency anemia, unspecified: Secondary | ICD-10-CM | POA: Diagnosis not present

## 2016-01-04 DIAGNOSIS — Z992 Dependence on renal dialysis: Secondary | ICD-10-CM | POA: Diagnosis not present

## 2016-01-04 DIAGNOSIS — R112 Nausea with vomiting, unspecified: Secondary | ICD-10-CM | POA: Diagnosis not present

## 2016-01-04 DIAGNOSIS — N186 End stage renal disease: Secondary | ICD-10-CM | POA: Diagnosis not present

## 2016-01-04 DIAGNOSIS — N2581 Secondary hyperparathyroidism of renal origin: Secondary | ICD-10-CM | POA: Diagnosis not present

## 2016-01-07 DIAGNOSIS — D509 Iron deficiency anemia, unspecified: Secondary | ICD-10-CM | POA: Diagnosis not present

## 2016-01-07 DIAGNOSIS — R112 Nausea with vomiting, unspecified: Secondary | ICD-10-CM | POA: Diagnosis not present

## 2016-01-07 DIAGNOSIS — E162 Hypoglycemia, unspecified: Secondary | ICD-10-CM | POA: Diagnosis not present

## 2016-01-07 DIAGNOSIS — N2581 Secondary hyperparathyroidism of renal origin: Secondary | ICD-10-CM | POA: Diagnosis not present

## 2016-01-07 DIAGNOSIS — N186 End stage renal disease: Secondary | ICD-10-CM | POA: Diagnosis not present

## 2016-01-07 DIAGNOSIS — Z992 Dependence on renal dialysis: Secondary | ICD-10-CM | POA: Diagnosis not present

## 2016-01-11 DIAGNOSIS — Z992 Dependence on renal dialysis: Secondary | ICD-10-CM | POA: Diagnosis not present

## 2016-01-11 DIAGNOSIS — N2581 Secondary hyperparathyroidism of renal origin: Secondary | ICD-10-CM | POA: Diagnosis not present

## 2016-01-11 DIAGNOSIS — N186 End stage renal disease: Secondary | ICD-10-CM | POA: Diagnosis not present

## 2016-01-11 DIAGNOSIS — E162 Hypoglycemia, unspecified: Secondary | ICD-10-CM | POA: Diagnosis not present

## 2016-01-11 DIAGNOSIS — R112 Nausea with vomiting, unspecified: Secondary | ICD-10-CM | POA: Diagnosis not present

## 2016-01-11 DIAGNOSIS — D509 Iron deficiency anemia, unspecified: Secondary | ICD-10-CM | POA: Diagnosis not present

## 2016-01-14 DIAGNOSIS — Z992 Dependence on renal dialysis: Secondary | ICD-10-CM | POA: Diagnosis not present

## 2016-01-14 DIAGNOSIS — D509 Iron deficiency anemia, unspecified: Secondary | ICD-10-CM | POA: Diagnosis not present

## 2016-01-14 DIAGNOSIS — E162 Hypoglycemia, unspecified: Secondary | ICD-10-CM | POA: Diagnosis not present

## 2016-01-14 DIAGNOSIS — N186 End stage renal disease: Secondary | ICD-10-CM | POA: Diagnosis not present

## 2016-01-14 DIAGNOSIS — N2581 Secondary hyperparathyroidism of renal origin: Secondary | ICD-10-CM | POA: Diagnosis not present

## 2016-01-14 DIAGNOSIS — R112 Nausea with vomiting, unspecified: Secondary | ICD-10-CM | POA: Diagnosis not present

## 2016-01-16 DIAGNOSIS — D509 Iron deficiency anemia, unspecified: Secondary | ICD-10-CM | POA: Diagnosis not present

## 2016-01-16 DIAGNOSIS — Z992 Dependence on renal dialysis: Secondary | ICD-10-CM | POA: Diagnosis not present

## 2016-01-16 DIAGNOSIS — R112 Nausea with vomiting, unspecified: Secondary | ICD-10-CM | POA: Diagnosis not present

## 2016-01-16 DIAGNOSIS — N2581 Secondary hyperparathyroidism of renal origin: Secondary | ICD-10-CM | POA: Diagnosis not present

## 2016-01-16 DIAGNOSIS — E162 Hypoglycemia, unspecified: Secondary | ICD-10-CM | POA: Diagnosis not present

## 2016-01-16 DIAGNOSIS — N186 End stage renal disease: Secondary | ICD-10-CM | POA: Diagnosis not present

## 2016-01-18 DIAGNOSIS — N186 End stage renal disease: Secondary | ICD-10-CM | POA: Diagnosis not present

## 2016-01-18 DIAGNOSIS — E162 Hypoglycemia, unspecified: Secondary | ICD-10-CM | POA: Diagnosis not present

## 2016-01-18 DIAGNOSIS — N2581 Secondary hyperparathyroidism of renal origin: Secondary | ICD-10-CM | POA: Diagnosis not present

## 2016-01-18 DIAGNOSIS — Z992 Dependence on renal dialysis: Secondary | ICD-10-CM | POA: Diagnosis not present

## 2016-01-18 DIAGNOSIS — D509 Iron deficiency anemia, unspecified: Secondary | ICD-10-CM | POA: Diagnosis not present

## 2016-01-18 DIAGNOSIS — R112 Nausea with vomiting, unspecified: Secondary | ICD-10-CM | POA: Diagnosis not present

## 2016-01-21 ENCOUNTER — Encounter (HOSPITAL_COMMUNITY): Payer: Self-pay | Admitting: Emergency Medicine

## 2016-01-21 ENCOUNTER — Other Ambulatory Visit: Payer: Self-pay | Admitting: *Deleted

## 2016-01-21 ENCOUNTER — Ambulatory Visit (HOSPITAL_COMMUNITY)
Admission: RE | Admit: 2016-01-21 | Discharge: 2016-01-21 | Disposition: A | Discharge status: Home / Self Care | Payer: Medicare Other | Source: Ambulatory Visit | Attending: Internal Medicine | Admitting: Internal Medicine

## 2016-01-21 ENCOUNTER — Other Ambulatory Visit (HOSPITAL_COMMUNITY)
Admission: RE | Admit: 2016-01-21 | Discharge: 2016-01-21 | Disposition: A | Discharge status: Home / Self Care | Payer: Medicare Other | Source: Ambulatory Visit | Attending: Family | Admitting: Family

## 2016-01-21 ENCOUNTER — Emergency Department (HOSPITAL_COMMUNITY)
Admission: EM | Admit: 2016-01-21 | Discharge: 2016-01-21 | Discharge status: Against Medical Advice | Payer: Medicare Other

## 2016-01-21 DIAGNOSIS — Z992 Dependence on renal dialysis: Secondary | ICD-10-CM | POA: Diagnosis not present

## 2016-01-21 DIAGNOSIS — R112 Nausea with vomiting, unspecified: Secondary | ICD-10-CM | POA: Diagnosis not present

## 2016-01-21 DIAGNOSIS — D509 Iron deficiency anemia, unspecified: Secondary | ICD-10-CM | POA: Diagnosis not present

## 2016-01-21 DIAGNOSIS — E60 Dietary zinc deficiency: Secondary | ICD-10-CM | POA: Insufficient documentation

## 2016-01-21 DIAGNOSIS — R600 Localized edema: Secondary | ICD-10-CM | POA: Insufficient documentation

## 2016-01-21 DIAGNOSIS — N2581 Secondary hyperparathyroidism of renal origin: Secondary | ICD-10-CM | POA: Diagnosis not present

## 2016-01-21 DIAGNOSIS — I82431 Acute embolism and thrombosis of right popliteal vein: Secondary | ICD-10-CM | POA: Diagnosis not present

## 2016-01-21 DIAGNOSIS — N186 End stage renal disease: Secondary | ICD-10-CM | POA: Diagnosis not present

## 2016-01-21 DIAGNOSIS — E162 Hypoglycemia, unspecified: Secondary | ICD-10-CM | POA: Diagnosis not present

## 2016-01-21 DIAGNOSIS — M7989 Other specified soft tissue disorders: Secondary | ICD-10-CM

## 2016-01-21 LAB — CBC WITH DIFFERENTIAL/PLATELET
BASOS PCT: 0 %
Basophils Absolute: 0 10*3/uL (ref 0.0–0.1)
EOS ABS: 0.1 10*3/uL (ref 0.0–0.7)
EOS PCT: 1 %
HCT: 41.7 % (ref 39.0–52.0)
HEMOGLOBIN: 13.7 g/dL (ref 13.0–17.0)
LYMPHS ABS: 1.6 10*3/uL (ref 0.7–4.0)
Lymphocytes Relative: 11 %
MCH: 30.1 pg (ref 26.0–34.0)
MCHC: 32.9 g/dL (ref 30.0–36.0)
MCV: 91.6 fL (ref 78.0–100.0)
MONO ABS: 1.8 10*3/uL — AB (ref 0.1–1.0)
MONOS PCT: 13 %
NEUTROS PCT: 75 %
Neutro Abs: 10.4 10*3/uL — ABNORMAL HIGH (ref 1.7–7.7)
Platelets: 123 10*3/uL — ABNORMAL LOW (ref 150–400)
RBC: 4.55 MIL/uL (ref 4.22–5.81)
RDW: 16.2 % — AB (ref 11.5–15.5)
WBC: 13.8 10*3/uL — ABNORMAL HIGH (ref 4.0–10.5)

## 2016-01-21 LAB — D-DIMER, QUANTITATIVE (NOT AT ARMC): D DIMER QUANT: 3.74 ug{FEU}/mL — AB (ref 0.00–0.50)

## 2016-01-21 NOTE — ED Triage Notes (Signed)
Di Kindle, NP states that they had sent pt up here for a [place] for a blood clot. Di Kindle, NP states that she called this person while in the waiting area and she was unsure if the pt understood her. Di Kindle, NP from [facility] states that they have confirmed a dvt in the right leg. She states that the pt is to go in for surgery tomorrow for a graft and that they would start the patient on blood thinners after surgery. Pt made aware and understands.

## 2016-01-21 NOTE — Progress Notes (Signed)
Pt was not sure if he was having surgery due to blood clots. Pre-op instructions given to pt only. Please complete assessment on DOS. Pt made aware to stop taking vitamins, fish oil, herbal medications and NSAID's. Pt verbalized understanding of all pre-op instructions.

## 2016-01-22 ENCOUNTER — Ambulatory Visit (HOSPITAL_COMMUNITY)
Admission: RE | Admit: 2016-01-22 | Discharge: 2016-01-22 | Disposition: A | Discharge status: Home / Self Care | Payer: Medicare Other | Source: Ambulatory Visit | Attending: Vascular Surgery | Admitting: Vascular Surgery

## 2016-01-22 ENCOUNTER — Encounter (HOSPITAL_COMMUNITY)
Admission: RE | Disposition: A | Discharge status: Home / Self Care | Payer: Self-pay | Source: Ambulatory Visit | Attending: Vascular Surgery

## 2016-01-22 ENCOUNTER — Encounter (HOSPITAL_COMMUNITY): Payer: Self-pay | Admitting: Anesthesiology

## 2016-01-22 ENCOUNTER — Other Ambulatory Visit: Payer: Self-pay | Admitting: *Deleted

## 2016-01-22 DIAGNOSIS — Z539 Procedure and treatment not carried out, unspecified reason: Secondary | ICD-10-CM | POA: Insufficient documentation

## 2016-01-22 DIAGNOSIS — I82401 Acute embolism and thrombosis of unspecified deep veins of right lower extremity: Secondary | ICD-10-CM

## 2016-01-22 DIAGNOSIS — I82431 Acute embolism and thrombosis of right popliteal vein: Secondary | ICD-10-CM | POA: Diagnosis not present

## 2016-01-22 SURGERY — ARTERIOVENOUS (AV) FISTULA CREATION
Anesthesia: Monitor Anesthesia Care

## 2016-01-22 MED ORDER — FENTANYL CITRATE (PF) 100 MCG/2ML IJ SOLN
INTRAMUSCULAR | Status: AC
  Filled 2016-01-22: qty 2

## 2016-01-22 MED ORDER — MIDAZOLAM HCL 2 MG/2ML IJ SOLN
INTRAMUSCULAR | Status: AC
  Filled 2016-01-22: qty 2

## 2016-01-22 MED ORDER — LIDOCAINE HCL (PF) 1 % IJ SOLN
INTRAMUSCULAR | Status: AC
  Filled 2016-01-22: qty 30

## 2016-01-22 MED ORDER — SODIUM CHLORIDE 0.9 % IV SOLN: INTRAVENOUS | Status: DC

## 2016-01-22 MED ORDER — CHLORHEXIDINE GLUCONATE CLOTH 2 % EX PADS: 6.0000 | MEDICATED_PAD | Freq: Once | CUTANEOUS | Status: DC

## 2016-01-22 MED ORDER — ENOXAPARIN SODIUM 120 MG/0.8ML ~~LOC~~ SOLN
1.0000 mg/kg | SUBCUTANEOUS | Status: AC
  Administered 2016-01-22: 120 mg via SUBCUTANEOUS
  Filled 2016-01-22: qty 0.8

## 2016-01-22 MED ORDER — DEXTROSE 5 % IV SOLN
1.5000 g | INTRAVENOUS | Status: DC
  Filled 2016-01-22: qty 1.5

## 2016-01-22 MED ORDER — PROPOFOL 10 MG/ML IV BOLUS
INTRAVENOUS | Status: AC
  Filled 2016-01-22: qty 20

## 2016-01-22 NOTE — Progress Notes (Signed)
Pt with acute popliteal DVT right leg.  Operation cancelled for today.  We will arrange follow up appt in 1 month to consider access at that point.  He has functioning catheter.  We will give him 1 mg/kg Lovenox dose today.  He will f/u with his primary for 6 month anticoagulation for treatment of his DVT  Ruta Hinds, MD [facility] [place] Office: [phone] Pager: [phone]

## 2016-01-22 NOTE — Anesthesia Preprocedure Evaluation (Signed)
Anesthesia Evaluation  Patient identified by MRN, date of birth, ID band Patient awake    Reviewed: Allergy & Precautions, Patient's Chart, lab work & pertinent test results  History of Anesthesia Complications Negative for: history of anesthetic complications  Airway        Dental   Pulmonary           Cardiovascular hypertension, + dysrhythmias      Neuro/Psych negative neurological ROS     GI/Hepatic   Endo/Other  diabetesMorbid obesity  Renal/GU ESRFRenal disease     Musculoskeletal   Abdominal   Peds  Hematology   Anesthesia Other Findings   Reproductive/Obstetrics                             Anesthesia Physical Anesthesia Plan  ASA: III  Anesthesia Plan: MAC   Post-op Pain Management:    Induction: Intravenous  Airway Management Planned: Natural Airway and Simple Face Mask  Additional Equipment:   Intra-op Plan:   Post-operative Plan:   Informed Consent: I have reviewed the patients History and Physical, chart, labs and discussed the procedure including the risks, benefits and alternatives for the proposed anesthesia with the patient or authorized representative who has indicated his/her understanding and acceptance.     Plan Discussed with: CRNA  Anesthesia Plan Comments:         Anesthesia Quick Evaluation

## 2016-01-23 DIAGNOSIS — D509 Iron deficiency anemia, unspecified: Secondary | ICD-10-CM | POA: Diagnosis not present

## 2016-01-23 DIAGNOSIS — R112 Nausea with vomiting, unspecified: Secondary | ICD-10-CM | POA: Diagnosis not present

## 2016-01-23 DIAGNOSIS — Z992 Dependence on renal dialysis: Secondary | ICD-10-CM | POA: Diagnosis not present

## 2016-01-23 DIAGNOSIS — N2581 Secondary hyperparathyroidism of renal origin: Secondary | ICD-10-CM | POA: Diagnosis not present

## 2016-01-23 DIAGNOSIS — E162 Hypoglycemia, unspecified: Secondary | ICD-10-CM | POA: Diagnosis not present

## 2016-01-23 DIAGNOSIS — N186 End stage renal disease: Secondary | ICD-10-CM | POA: Diagnosis not present

## 2016-01-24 ENCOUNTER — Encounter (HOSPITAL_COMMUNITY): Payer: Self-pay | Admitting: Emergency Medicine

## 2016-01-24 ENCOUNTER — Emergency Department (HOSPITAL_COMMUNITY)
Admission: EM | Admit: 2016-01-24 | Discharge: 2016-01-24 | Disposition: A | Discharge status: Home / Self Care | Payer: Medicare Other | Attending: Emergency Medicine | Admitting: Emergency Medicine

## 2016-01-24 ENCOUNTER — Emergency Department (HOSPITAL_COMMUNITY): Payer: Medicare Other

## 2016-01-24 ENCOUNTER — Other Ambulatory Visit (HOSPITAL_COMMUNITY)
Admission: RE | Admit: 2016-01-24 | Discharge: 2016-01-24 | Disposition: A | Discharge status: Home / Self Care | Payer: Medicare Other | Source: Ambulatory Visit | Attending: Family | Admitting: Family

## 2016-01-24 DIAGNOSIS — I82409 Acute embolism and thrombosis of unspecified deep veins of unspecified lower extremity: Secondary | ICD-10-CM

## 2016-01-24 DIAGNOSIS — E1122 Type 2 diabetes mellitus with diabetic chronic kidney disease: Secondary | ICD-10-CM | POA: Diagnosis not present

## 2016-01-24 DIAGNOSIS — Z79899 Other long term (current) drug therapy: Secondary | ICD-10-CM | POA: Diagnosis not present

## 2016-01-24 DIAGNOSIS — L03115 Cellulitis of right lower limb: Secondary | ICD-10-CM | POA: Insufficient documentation

## 2016-01-24 DIAGNOSIS — Z7901 Long term (current) use of anticoagulants: Secondary | ICD-10-CM | POA: Diagnosis not present

## 2016-01-24 DIAGNOSIS — I824Z1 Acute embolism and thrombosis of unspecified deep veins of right distal lower extremity: Secondary | ICD-10-CM | POA: Diagnosis not present

## 2016-01-24 DIAGNOSIS — M79604 Pain in right leg: Secondary | ICD-10-CM | POA: Diagnosis not present

## 2016-01-24 DIAGNOSIS — I129 Hypertensive chronic kidney disease with stage 1 through stage 4 chronic kidney disease, or unspecified chronic kidney disease: Secondary | ICD-10-CM | POA: Diagnosis not present

## 2016-01-24 DIAGNOSIS — N184 Chronic kidney disease, stage 4 (severe): Secondary | ICD-10-CM | POA: Diagnosis not present

## 2016-01-24 DIAGNOSIS — M79661 Pain in right lower leg: Secondary | ICD-10-CM | POA: Diagnosis present

## 2016-01-24 LAB — CBC WITH DIFFERENTIAL/PLATELET
Basophils Absolute: 0 10*3/uL (ref 0.0–0.1)
Basophils Relative: 0 %
Eosinophils Absolute: 0.2 10*3/uL (ref 0.0–0.7)
Eosinophils Relative: 1 %
HEMATOCRIT: 35.3 % — AB (ref 39.0–52.0)
HEMOGLOBIN: 11.4 g/dL — AB (ref 13.0–17.0)
LYMPHS ABS: 1.8 10*3/uL (ref 0.7–4.0)
Lymphocytes Relative: 11 %
MCH: 29.7 pg (ref 26.0–34.0)
MCHC: 32.3 g/dL (ref 30.0–36.0)
MCV: 91.9 fL (ref 78.0–100.0)
Monocytes Absolute: 1.5 10*3/uL — ABNORMAL HIGH (ref 0.1–1.0)
Monocytes Relative: 9 %
NEUTROS ABS: 13.5 10*3/uL — AB (ref 1.7–7.7)
NEUTROS PCT: 79 %
Platelets: 129 10*3/uL — ABNORMAL LOW (ref 150–400)
RBC: 3.84 MIL/uL — AB (ref 4.22–5.81)
RDW: 16.1 % — ABNORMAL HIGH (ref 11.5–15.5)
WBC: 16.9 10*3/uL — ABNORMAL HIGH (ref 4.0–10.5)

## 2016-01-24 LAB — BASIC METABOLIC PANEL
Anion gap: 10 (ref 5–15)
BUN: 43 mg/dL — AB (ref 6–20)
CHLORIDE: 102 mmol/L (ref 101–111)
CO2: 26 mmol/L (ref 22–32)
Calcium: 9 mg/dL (ref 8.9–10.3)
Creatinine, Ser: 12.08 mg/dL — ABNORMAL HIGH (ref 0.61–1.24)
GFR calc Af Amer: 5 mL/min — ABNORMAL LOW (ref >60)
GFR calc non Af Amer: 4 mL/min — ABNORMAL LOW (ref >60)
GLUCOSE: 83 mg/dL (ref 65–99)
Potassium: 4.2 mmol/L (ref 3.5–5.1)
Sodium: 138 mmol/L (ref 135–145)

## 2016-01-24 LAB — PROTIME-INR
INR: 1.35
INR: 1.37
Prothrombin Time: 16.8 seconds — ABNORMAL HIGH (ref 11.4–15.2)
Prothrombin Time: 16.9 seconds — ABNORMAL HIGH (ref 11.4–15.2)

## 2016-01-24 LAB — LACTIC ACID, PLASMA
Lactic Acid, Venous: 1.1 mmol/L (ref 0.5–1.9)
Lactic Acid, Venous: 1.1 mmol/L (ref 0.5–1.9)

## 2016-01-24 MED ORDER — CLINDAMYCIN PHOSPHATE 600 MG/50ML IV SOLN: 600.0000 mg | Freq: Once | INTRAVENOUS | Status: DC

## 2016-01-24 MED ORDER — DOXYCYCLINE HYCLATE 100 MG PO TABS: 100.0000 mg | ORAL_TABLET | Freq: Two times a day (BID) | ORAL | 0 refills | Status: DC

## 2016-01-24 MED ORDER — DOXYCYCLINE HYCLATE 100 MG PO TABS
100.0000 mg | ORAL_TABLET | Freq: Once | ORAL | Status: AC
  Administered 2016-01-24: 100 mg via ORAL
  Filled 2016-01-24: qty 1

## 2016-01-24 MED ORDER — ENOXAPARIN SODIUM 120 MG/0.8ML ~~LOC~~ SOLN
120.0000 mg | SUBCUTANEOUS | Status: DC
  Administered 2016-01-24: 120 mg via SUBCUTANEOUS
  Filled 2016-01-24: qty 0.8

## 2016-01-24 MED ORDER — ACETAMINOPHEN 500 MG PO TABS
1000.0000 mg | ORAL_TABLET | Freq: Once | ORAL | Status: AC
  Administered 2016-01-24: 1000 mg via ORAL
  Filled 2016-01-24: qty 2

## 2016-01-24 NOTE — Progress Notes (Signed)
ANTICOAGULATION CONSULT NOTE - Follow Up Consult  Pharmacy Consult for lovenox Indication: DVT  No Known Allergies  Patient Measurements: Height: 5\' 11"  (180.3 cm) Weight: 260 lb (117.9 kg) IBW/kg (Calculated) : 75.3   Vital Signs: Temp: 98.5 F (36.9 C) (12/21 1118) Temp Source: Oral (12/21 1118) BP: 108/87 (12/21 1337) Pulse Rate: 92 (12/21 1337)  Labs:  Recent Labs  01/21/16 1800 01/24/16 1035 01/24/16 1318  HGB 13.7  --  11.4*  HCT 41.7  --  35.3*  PLT 123*  --  129*  LABPROT  --  16.8* 16.9*  INR  --  1.35 1.37  CREATININE  --   --  12.08*    Estimated Creatinine Clearance: 9.8 mL/min (by C-G formula based on SCr of 12.08 mg/dL (H)).   Medications:  See medication history Coumadin 5mg  daily  Assessment: 50 yo man diagnosed with DVT 12/18 and placed on coumadin. He presents today with pain and edema in leg Goal of Therapy:  Anti-Xa level 0.6-1 units/ml 4hrs after LMWH dose given Monitor platelets by anticoagulation protocol: Yes   Plan:  Lovenox 120 mg sq q24 hours F/u CBC.  Lloyd Cullinan, [place] 01/24/2016,4:39 PM

## 2016-01-24 NOTE — ED Notes (Signed)
Patient reported he was "hurting too bad to stand" for the standing portion of the orthostatic vital signs.

## 2016-01-24 NOTE — Discharge Instructions (Signed)
Take the prescription as directed.  Call your regular medical doctor tomorrow to schedule a follow up appointment within the next 2 days for a re-check.  Return to the Emergency Department immediately sooner if worsening or you cannot follow up with your regular medical doctor.

## 2016-01-24 NOTE — ED Provider Notes (Signed)
[facility] DEPT Provider Note   CSN: [id number] Arrival date & time: 01/24/16  1059     History   Chief Complaint Chief Complaint  Patient presents with  . Leg Pain    HPI Jack Irwin is a 50 y.o. male.   Leg Pain      Pt was seen at 1250.  Per pt, c/o gradual onset and worsening of persistent right lower leg "pain" for the past 2 to 3 days. Pt states he "just noticed" a reddened area on his right lower leg today. Pt states he "felt lightheaded" and "felt like I had a fever" today, so he came to the ED for evaluation. Pt states he was dx with RLE DVT 3 days ago, rx coumadin. Denies CP/palpitations, no SOB/cough, no abd pain, no N/V/D, no injury, no focal motor weakness, no tingling/numbness in extremities.   Past Medical History:  Diagnosis Date  . Anemia   . Arthritis   . BPH (benign prostatic hyperplasia)   . Chronic back pain   . Chronic kidney disease   . Chronic kidney disease (CKD), stage IV (severe) ([place])   . Constipation   . Constipation   . Decubitus ulcer    of 2nd toes of both feet.  . Diabetes mellitus   . Difficulty walking   . DVT (deep venous thrombosis) ([place])   . Edema leg   . Gout   . Hypertension   . Lack of coordination   . Neuropathy ([place])   . Neuropathy, diabetic ([place])   . Physical deconditioning   . Poor balance   . Renal insufficiency   . Venous (peripheral) insufficiency     Patient Active Problem List   Diagnosis Date Noted  . Nausea & vomiting 11/17/2014  . Uremia syndrome 11/17/2014  . Uremia 11/17/2014  . Thrombocytopenia ([place]) 02/21/2014  . Morbid obesity ([place]) 03/30/2013  . RBBB 03/29/2013  . Hyperkalemia 03/28/2013  . Leukocytosis 03/28/2013  . Nausea vomiting and diarrhea 03/28/2013  . BBB (bundle branch block) 03/28/2013  . Hypertension   . ESRD on dialysis ([place])   . Lack of coordination 04/15/2012  . Muscle weakness (generalized) 04/15/2012  . Arthritis, gouty 04/15/2012  . Difficulty in walking(719.7)  08/18/2011  . Weakness of both legs 08/18/2011  . Poor balance 08/18/2011    Past Surgical History:  Procedure Laterality Date  . AV FISTULA PLACEMENT Left 03/09/2014   Procedure: INSERTION OF ARTERIOVENOUS (AV) GORE-TEX GRAFT ARM;  Surgeon: Angelia Mould, MD;  Location: [facility];  Service: Vascular;  Laterality: Left;  . CYST EXCISION Right    cyst removed on thumb 2001  . INSERTION OF DIALYSIS CATHETER    . INSERTION OF DIALYSIS CATHETER Left 03/09/2014   Procedure: INSERTION OF DIALYSIS CATHETER;  Surgeon: Angelia Mould, MD;  Location: [facility];  Service: Vascular;  Laterality: Left;  . none         Home Medications    Prior to Admission medications   Medication Sig Start Date End Date Taking? Authorizing Provider  colchicine 0.6 MG tablet 1/2 tab po 2 times /week Patient taking differently: Take 0.3 mg by mouth daily as needed (for gout).  08/15/15   Lily Kocher, PA-C  diclofenac sodium (VOLTAREN) 1 % GEL Apply to left wrist and hand three times daily Patient not taking: Reported on 01/22/2016 04/13/14   Lily Kocher, PA-C  doxycycline (VIBRAMYCIN) 100 MG capsule Take 1 capsule (100 mg total) by mouth 2 (two) times daily. Patient not  taking: Reported on 01/22/2016 11/02/15   Orpah Greek, MD  gabapentin (NEURONTIN) 100 MG capsule Take 100 mg by mouth daily.  07/11/14   Historical Provider, MD  Multiple Vitamin (DAILY VITE PO) Take 1 tablet by mouth daily.    Historical Provider, MD  Pseudoeph-CPM-DM-APAP (SEVERE COLD/FLU PO) Take 2 tablets by mouth as needed (for cold and flu like symptoms).    Historical Provider, MD  RENVELA 800 MG tablet Take 800 mg by mouth 3 (three) times daily with meals.  07/18/14   Historical Provider, MD  sildenafil (REVATIO) 20 MG tablet Take 20 mg by mouth as needed.    Historical Provider, MD  ULORIC 80 MG TABS Take 80 mg by mouth daily. 09/26/14   Historical Provider, MD    Family History Family History  Problem Relation Age of  Onset  . Diabetes Mother   . Hypertension Mother   . Heart failure Mother   . Hyperlipidemia Mother   . Heart attack Mother   . Cancer Father   . Diabetes Father   . Hypertension Father   . Hyperlipidemia Father     Social History Social History  Substance Use Topics  . Smoking status: Never Smoker  . Smokeless tobacco: Never Used  . Alcohol use No     Allergies   Patient has no known allergies.   Review of Systems Review of Systems ROS: Statement: All systems negative except as marked or noted in the HPI; Constitutional: Negative for fever and chills. +lightheadedness.; ; Eyes: Negative for eye pain, redness and discharge. ; ; ENMT: Negative for ear pain, hoarseness, nasal congestion, sinus pressure and sore throat. ; ; Cardiovascular: Negative for chest pain, palpitations, diaphoresis, dyspnea and peripheral edema. ; ; Respiratory: Negative for cough, wheezing and stridor. ; ; Gastrointestinal: Negative for nausea, vomiting, diarrhea, abdominal pain, blood in stool, hematemesis, jaundice and rectal bleeding. . ; ; Genitourinary: Negative for dysuria, flank pain and hematuria. ; ; Musculoskeletal: +right leg pain. Negative for back pain and neck pain. Negative for trauma.; ; Skin: +rash. Negative for pruritus, abrasions, blisters, bruising and skin lesion.; ; Neuro: Negative for headache and neck stiffness. Negative for weakness, altered level of consciousness, altered mental status, extremity weakness, paresthesias, involuntary movement, seizure and syncope.      Physical Exam Updated Vital Signs BP 108/87 (BP Location: Left Arm)   Pulse 92   Temp 98.5 F (36.9 C) (Oral)   Resp 16   Ht 5\' 11"  (1.803 m)   Wt 260 lb (117.9 kg)   SpO2 98%   BMI 36.26 kg/m   13:33 Orthostatic Vital Signs KS  Orthostatic Lying   BP- Lying: 115/82  Pulse- Lying: 91      Orthostatic Sitting  BP- Sitting: 108/87  Pulse- Sitting: 95     Physical Exam 1255: Physical examination:   Nursing notes reviewed; Vital signs and O2 SAT reviewed;  Constitutional: Well developed, Well nourished, Well hydrated, In no acute distress; Head:  Normocephalic, atraumatic; Eyes: EOMI, PERRL, No scleral icterus; ENMT: Mouth and pharynx normal, Mucous membranes moist; Neck: Supple, Full range of motion, No lymphadenopathy; Cardiovascular: Regular rate and rhythm, No gallop; Respiratory: Breath sounds clear & equal bilaterally, No wheezes.  Speaking full sentences with ease, Normal respiratory effort/excursion; Chest: Nontender, Movement normal; Abdomen: Soft, Nontender, Nondistended, Normal bowel sounds; Genitourinary: No CVA tenderness; Extremities: Pulses normal, +right lower anterior tibial area erythematous with localized tenderness. No soft tissue crepitus, no fluctuance, no open wounds. +tr pedal edema  bilat. No calf edema or asymmetry.; Neuro: AA&Ox3, Major CN grossly intact.  Speech clear. No gross focal motor or sensory deficits in extremities.; Skin: Color normal, Warm, Dry.   ED Treatments / Results  Labs (all labs ordered are listed, but only abnormal results are displayed)   EKG  EKG Interpretation None       Radiology   Procedures Procedures (including critical care time)  Medications Ordered in ED Medications - No data to display   Initial Impression / Assessment and Plan / ED Course  I have reviewed the triage vital signs and the nursing notes.  Pertinent labs & imaging results that were available during my care of the patient were reviewed by me and considered in my medical decision making (see chart for details).  MDM Reviewed: previous chart, nursing note and vitals Reviewed previous: labs and ultrasound Interpretation: labs and ultrasound   Results for orders placed or performed during the hospital encounter of 37/16/96  Basic metabolic panel  Result Value Ref Range   Sodium 138 135 - 145 mmol/L   Potassium 4.2 3.5 - 5.1 mmol/L   Chloride 102 101 - 111  mmol/L   CO2 26 22 - 32 mmol/L   Glucose, Bld 83 65 - 99 mg/dL   BUN 43 (H) 6 - 20 mg/dL   Creatinine, Ser 12.08 (H) 0.61 - 1.24 mg/dL   Calcium 9.0 8.9 - 10.3 mg/dL   GFR calc non Af Amer 4 (L) >60 mL/min   GFR calc Af Amer 5 (L) >60 mL/min   Anion gap 10 5 - 15  Lactic acid, plasma  Result Value Ref Range   Lactic Acid, Venous 1.1 0.5 - 1.9 mmol/L  CBC with Differential  Result Value Ref Range   WBC 16.9 (H) 4.0 - 10.5 K/uL   RBC 3.84 (L) 4.22 - 5.81 MIL/uL   Hemoglobin 11.4 (L) 13.0 - 17.0 g/dL   HCT 35.3 (L) 39.0 - 52.0 %   MCV 91.9 78.0 - 100.0 fL   MCH 29.7 26.0 - 34.0 pg   MCHC 32.3 30.0 - 36.0 g/dL   RDW 16.1 (H) 11.5 - 15.5 %   Platelets 129 (L) 150 - 400 K/uL   Neutrophils Relative % 79 %   Neutro Abs 13.5 (H) 1.7 - 7.7 K/uL   Lymphocytes Relative 11 %   Lymphs Abs 1.8 0.7 - 4.0 K/uL   Monocytes Relative 9 %   Monocytes Absolute 1.5 (H) 0.1 - 1.0 K/uL   Eosinophils Relative 1 %   Eosinophils Absolute 0.2 0.0 - 0.7 K/uL   Basophils Relative 0 %   Basophils Absolute 0.0 0.0 - 0.1 K/uL  Protime-INR  Result Value Ref Range   Prothrombin Time 16.9 (H) 11.4 - 15.2 seconds   INR 1.37    US Venous Img Lower Unilateral Right Result Date: 01/24/2016 CLINICAL DATA:  Right lower extremity increasing pain, history of nonocclusive right popliteal DVT extending into the peroneal vein. EXAM: RIGHT LOWER EXTREMITY VENOUS DOPPLER ULTRASOUND TECHNIQUE: Gray-scale sonography with graded compression, as well as color Doppler and duplex ultrasound were performed to evaluate the lower extremity deep venous systems from the level of the common femoral vein and including the common femoral, femoral, profunda femoral, popliteal and calf veins including the posterior tibial, peroneal and gastrocnemius veins when visible. The superficial great saphenous vein was also interrogated. Spectral Doppler was utilized to evaluate flow at rest and with distal augmentation maneuvers in the common  femoral, femoral and popliteal  veins. COMPARISON:  01/21/2016 FINDINGS: Contralateral Common Femoral Vein: Respiratory phasicity is normal and symmetric with the symptomatic side. No evidence of thrombus. Normal compressibility. Common Femoral Vein: No evidence of thrombus. Normal compressibility, respiratory phasicity and response to augmentation. Saphenofemoral Junction: No evidence of thrombus. Normal compressibility and flow on color Doppler imaging. Profunda Femoral Vein: No evidence of thrombus. Normal compressibility and flow on color Doppler imaging. Femoral Vein: No evidence of thrombus. Normal compressibility, respiratory phasicity and response to augmentation. Popliteal Vein: Similar nonocclusive thrombus in the right popliteal vein appearing partially compressible. No propagation into the femoral vein. Phasic flow remains. Calf Veins: Posterior tibial vein remains patent. Nonocclusive thrombus suspected in the right peroneal vein. Superficial Great Saphenous Vein: No evidence of thrombus. Normal compressibility and flow on color Doppler imaging. Venous Reflux:  None. Other Findings: Subcutaneous calf edema evident. Fatty replaced mildly prominent right inguinal lymph node measures 2.7 x 1.1 x 1.5 cm. IMPRESSION: No significant change in nonocclusive popliteal DVT extending into peroneal vein. Electronically Signed   By: Jerilynn Mages.  Shick M.D.   On: 01/24/2016 15:33   US Venous Img Lower Unilateral Right Result Date: 01/21/2016 CLINICAL DATA:  50 year old male with a history of right lower extremity pain and swelling EXAM: RIGHT LOWER EXTREMITY VENOUS DOPPLER ULTRASOUND TECHNIQUE: Gray-scale sonography with graded compression, as well as color Doppler and duplex ultrasound were performed to evaluate the lower extremity deep venous systems from the level of the common femoral vein and including the common femoral, femoral, profunda femoral, popliteal and calf veins including the posterior tibial, peroneal and  gastrocnemius veins when visible. The superficial great saphenous vein was also interrogated. Spectral Doppler was utilized to evaluate flow at rest and with distal augmentation maneuvers in the common femoral, femoral and popliteal veins. COMPARISON:  None. FINDINGS: Contralateral Common Femoral Vein: Respiratory phasicity is normal and symmetric with the symptomatic side. No evidence of thrombus. Normal compressibility. Common Femoral Vein: No evidence of thrombus. Normal compressibility, respiratory phasicity and response to augmentation. Saphenofemoral Junction: No evidence of thrombus. Normal compressibility and flow on color Doppler imaging. Profunda Femoral Vein: No evidence of thrombus. Normal compressibility and flow on color Doppler imaging. Femoral Vein: No evidence of thrombus. Normal compressibility, respiratory phasicity and response to augmentation. Popliteal Vein: Nonocclusive thrombus of the popliteal vein which likely extends into the peroneal vein. Calf Veins: No thrombus identified within the posterior tibial vein. Peroneal vein is likely and vault with DVT. Other Findings:  None. IMPRESSION: Sonographic survey positive for nonocclusive DVT of the popliteal vein, likely extending into the peroneal vein. These results were called to the referring physician at the time of interpretation by the technologist performing the study. Signed, Dulcy Fanny. Earleen Newport, DO [facility] Electronically Signed   By: Corrie Mckusick D.O.   On: 01/21/2016 17:10    1645:  SQ lovenox given in ED. I offered admission for RLE cellulitis. Pt states he wants to go home. Pt then spoke with his wife on the phone, reiterates that he does not want to be admitted to the hospital, and wants to go home. Will dose PO doxycycline (Pharmacist called to verify dosing) before discharge and rx same. Pt also requesting a work note for tomorrow. Strict return precautions given. Dx  and testing d/w pt.  Questions answered.  Verb understanding, agreeable to d/c home with outpt f/u in HD tomorrow and by PMD in the next 2 days (or return to ED for re-check).  Final Clinical Impressions(s) / ED Diagnoses   Final diagnoses:  None    New Prescriptions New Prescriptions   No medications on file      Francine Graven, DO 01/28/16 1651

## 2016-01-24 NOTE — ED Notes (Signed)
Attempted IV access x 2. Unsuccessful. 

## 2016-01-24 NOTE — ED Triage Notes (Signed)
Pt reports pain and edema in his R leg. Pt reports he was dx with DVT on Monday. Pt was scheduled for surgery on Tues for dialysis catheter but surgery was postponed. Pt reports dizziness and "feeling flush." Pt states he thought he might have a fever so he just wanted to come in and get his temperature checked.

## 2016-01-24 NOTE — ED Notes (Signed)
Pt dressed and assisted to wheelchair out to meet wife.Patient given discharge instruction, verbalized understand.  Patient wheelchair out of the department.

## 2016-01-25 DIAGNOSIS — R112 Nausea with vomiting, unspecified: Secondary | ICD-10-CM | POA: Diagnosis not present

## 2016-01-25 DIAGNOSIS — D509 Iron deficiency anemia, unspecified: Secondary | ICD-10-CM | POA: Diagnosis not present

## 2016-01-25 DIAGNOSIS — N186 End stage renal disease: Secondary | ICD-10-CM | POA: Diagnosis not present

## 2016-01-25 DIAGNOSIS — E162 Hypoglycemia, unspecified: Secondary | ICD-10-CM | POA: Diagnosis not present

## 2016-01-25 DIAGNOSIS — N2581 Secondary hyperparathyroidism of renal origin: Secondary | ICD-10-CM | POA: Diagnosis not present

## 2016-01-25 DIAGNOSIS — Z992 Dependence on renal dialysis: Secondary | ICD-10-CM | POA: Diagnosis not present

## 2016-01-25 IMAGING — DX DG CHEST 2V
2 series · 2 of 2 positions shown · non-contrast
Comparison: 04/05/2014

CLINICAL DATA: Weakness following dialysis

EXAM:
CHEST - 2 VIEW

[chest lat]
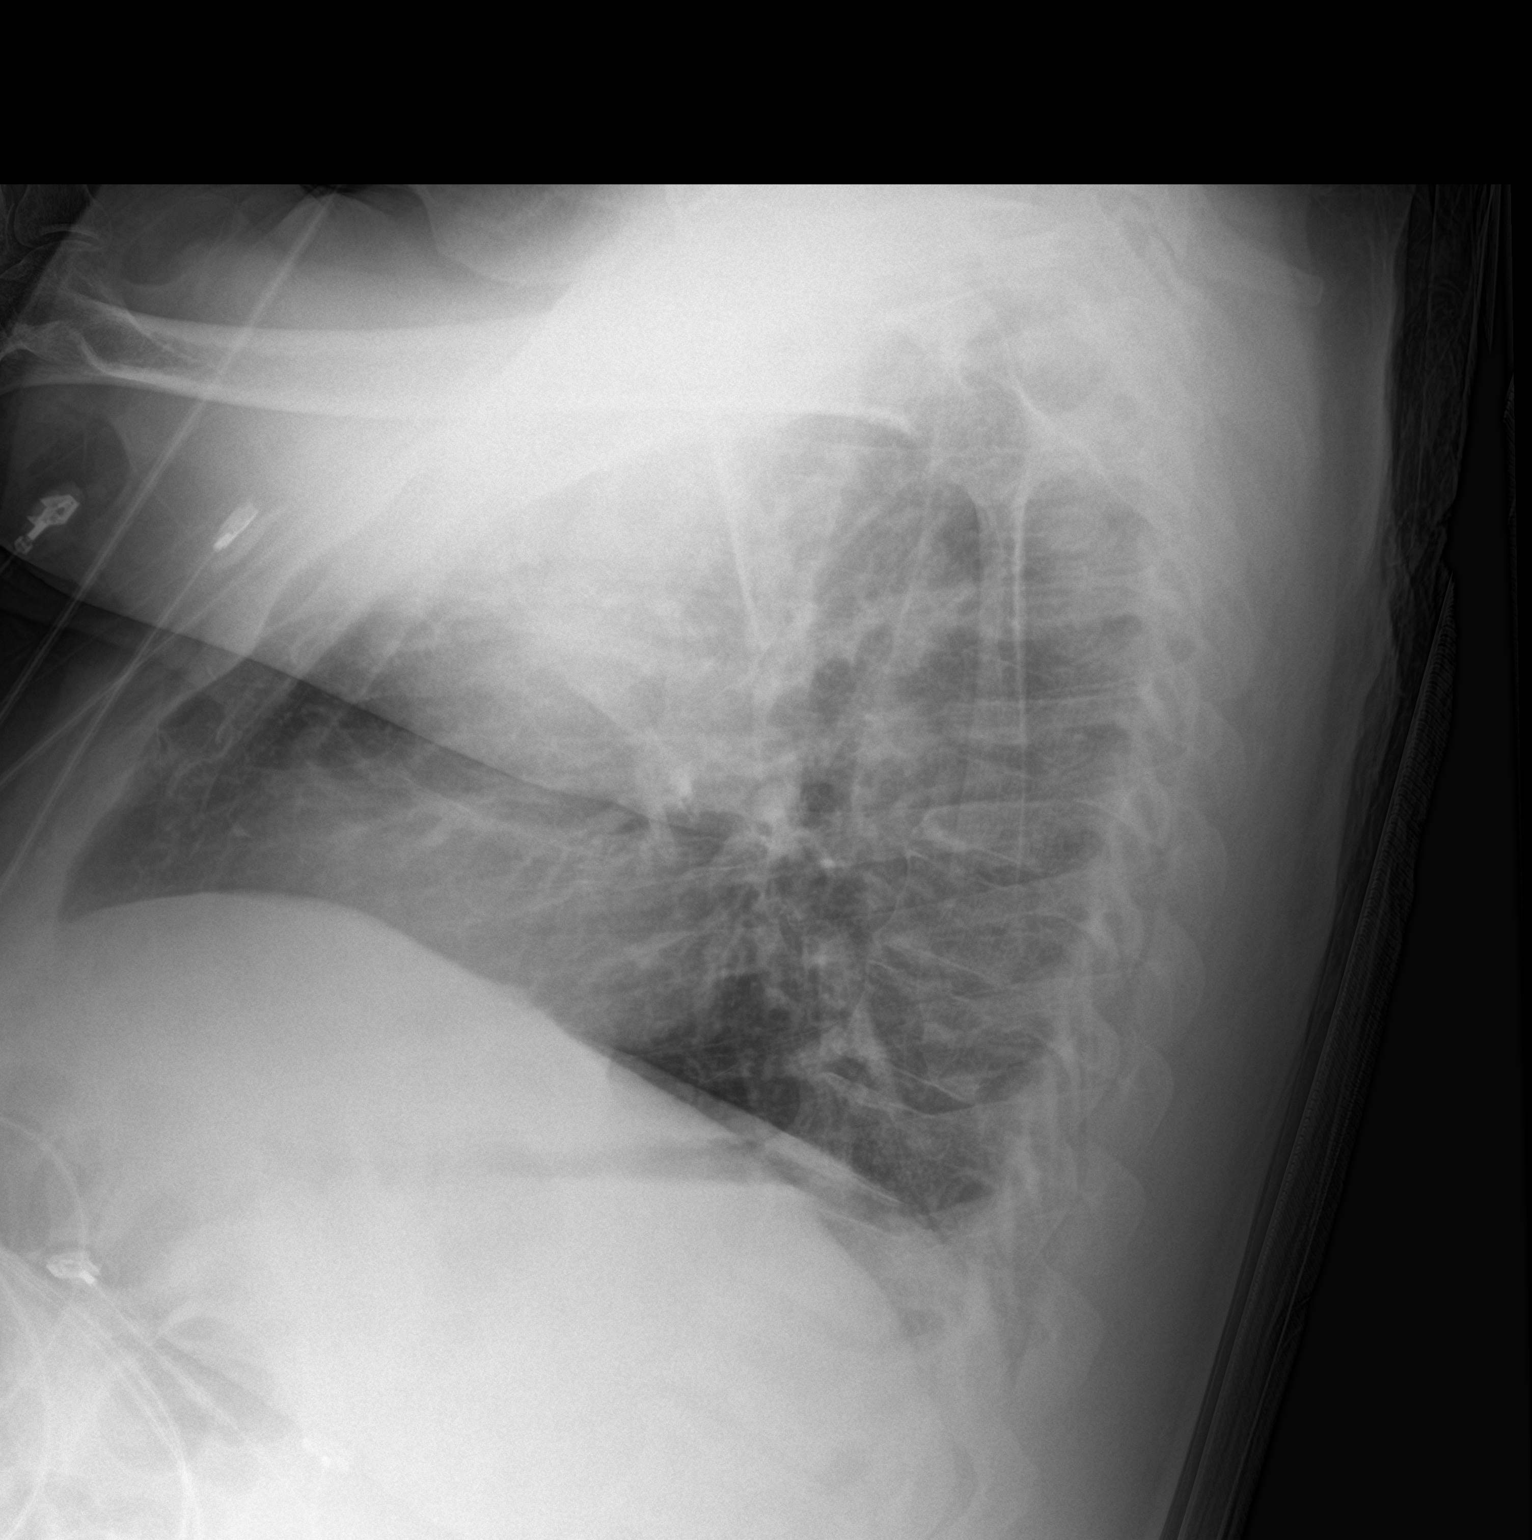

[chest ap strecther]
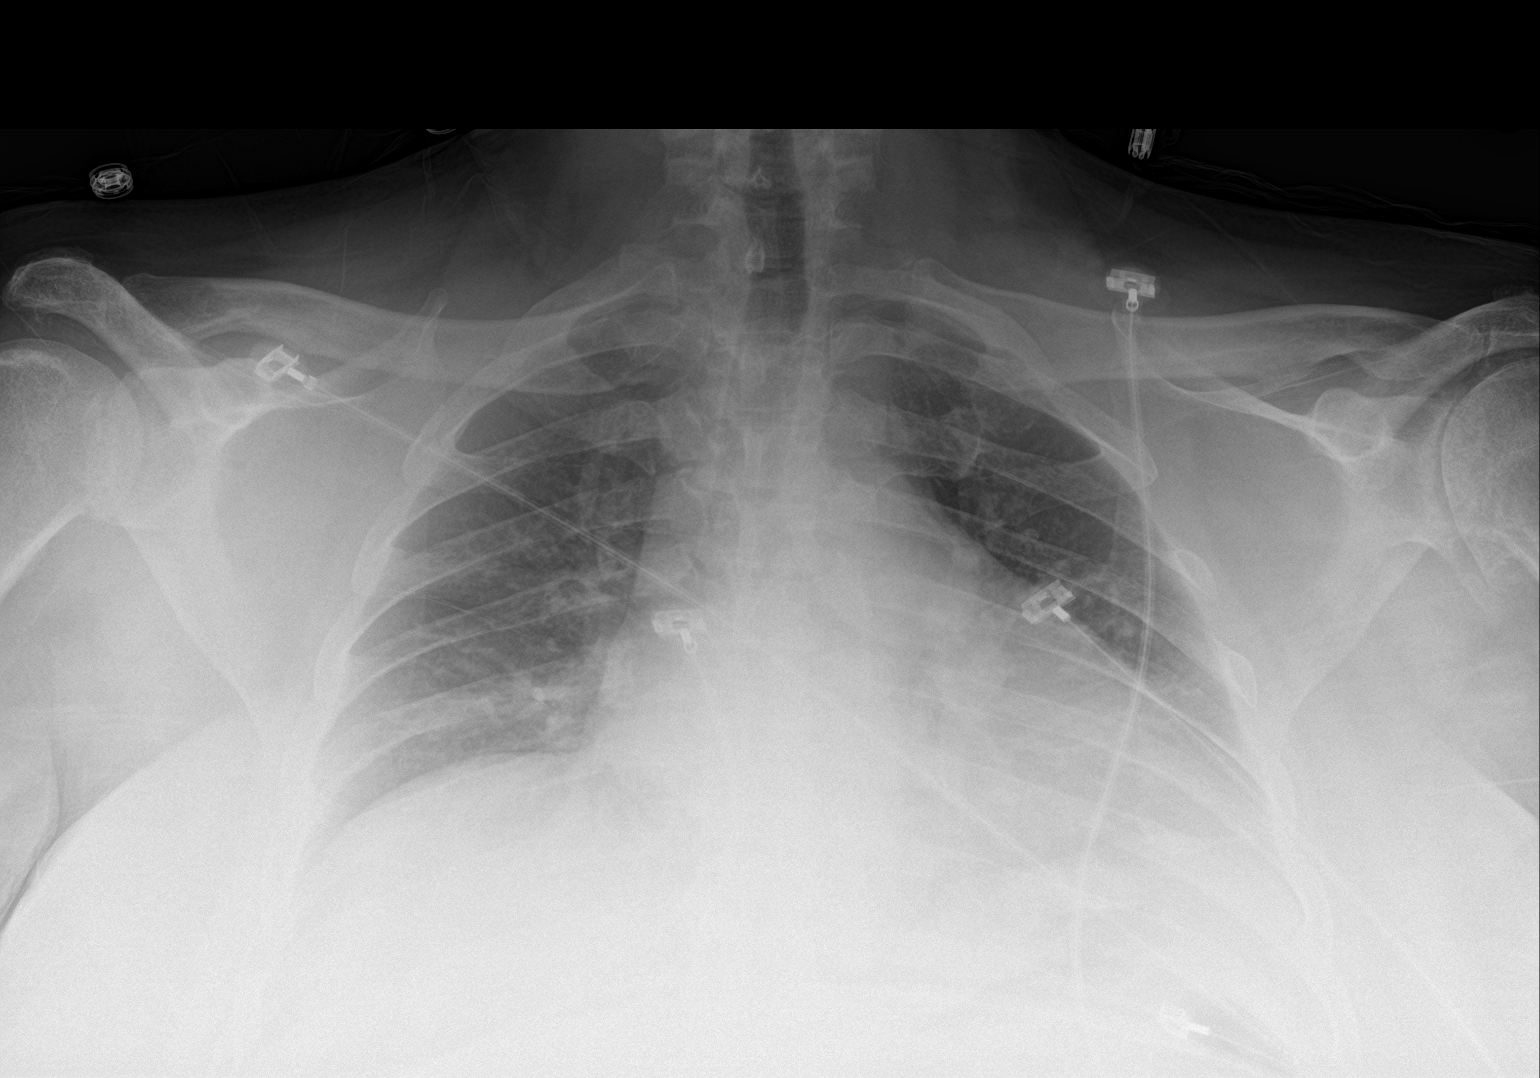

[2 of 2 positions shown; findings below may reference images not displayed]

FINDINGS: Cardiac shadow is enlarged but stable. The overall inspiratory
effort is poor although no focal infiltrate is seen. No sizable
effusion is noted.
IMPRESSION: Poor inspiratory effort without acute abnormality.

## 2016-01-26 DIAGNOSIS — I82431 Acute embolism and thrombosis of right popliteal vein: Secondary | ICD-10-CM | POA: Diagnosis not present

## 2016-01-27 DIAGNOSIS — N2581 Secondary hyperparathyroidism of renal origin: Secondary | ICD-10-CM | POA: Diagnosis not present

## 2016-01-27 DIAGNOSIS — D509 Iron deficiency anemia, unspecified: Secondary | ICD-10-CM | POA: Diagnosis not present

## 2016-01-27 DIAGNOSIS — N186 End stage renal disease: Secondary | ICD-10-CM | POA: Diagnosis not present

## 2016-01-27 DIAGNOSIS — R112 Nausea with vomiting, unspecified: Secondary | ICD-10-CM | POA: Diagnosis not present

## 2016-01-27 DIAGNOSIS — Z992 Dependence on renal dialysis: Secondary | ICD-10-CM | POA: Diagnosis not present

## 2016-01-27 DIAGNOSIS — E162 Hypoglycemia, unspecified: Secondary | ICD-10-CM | POA: Diagnosis not present

## 2016-01-30 DIAGNOSIS — N186 End stage renal disease: Secondary | ICD-10-CM | POA: Diagnosis not present

## 2016-01-30 DIAGNOSIS — I82431 Acute embolism and thrombosis of right popliteal vein: Secondary | ICD-10-CM | POA: Diagnosis not present

## 2016-01-30 DIAGNOSIS — N2581 Secondary hyperparathyroidism of renal origin: Secondary | ICD-10-CM | POA: Diagnosis not present

## 2016-01-30 DIAGNOSIS — E162 Hypoglycemia, unspecified: Secondary | ICD-10-CM | POA: Diagnosis not present

## 2016-01-30 DIAGNOSIS — Z992 Dependence on renal dialysis: Secondary | ICD-10-CM | POA: Diagnosis not present

## 2016-01-30 DIAGNOSIS — D509 Iron deficiency anemia, unspecified: Secondary | ICD-10-CM | POA: Diagnosis not present

## 2016-01-30 DIAGNOSIS — R112 Nausea with vomiting, unspecified: Secondary | ICD-10-CM | POA: Diagnosis not present

## 2016-01-30 LAB — PROTIME-INR

## 2016-02-01 DIAGNOSIS — R112 Nausea with vomiting, unspecified: Secondary | ICD-10-CM | POA: Diagnosis not present

## 2016-02-01 DIAGNOSIS — D509 Iron deficiency anemia, unspecified: Secondary | ICD-10-CM | POA: Diagnosis not present

## 2016-02-01 DIAGNOSIS — Z992 Dependence on renal dialysis: Secondary | ICD-10-CM | POA: Diagnosis not present

## 2016-02-01 DIAGNOSIS — N2581 Secondary hyperparathyroidism of renal origin: Secondary | ICD-10-CM | POA: Diagnosis not present

## 2016-02-01 DIAGNOSIS — E162 Hypoglycemia, unspecified: Secondary | ICD-10-CM | POA: Diagnosis not present

## 2016-02-01 DIAGNOSIS — N186 End stage renal disease: Secondary | ICD-10-CM | POA: Diagnosis not present

## 2016-02-03 DIAGNOSIS — N186 End stage renal disease: Secondary | ICD-10-CM | POA: Diagnosis not present

## 2016-02-03 DIAGNOSIS — Z992 Dependence on renal dialysis: Secondary | ICD-10-CM | POA: Diagnosis not present

## 2016-02-04 DIAGNOSIS — D509 Iron deficiency anemia, unspecified: Secondary | ICD-10-CM | POA: Diagnosis not present

## 2016-02-04 DIAGNOSIS — N186 End stage renal disease: Secondary | ICD-10-CM | POA: Diagnosis not present

## 2016-02-04 DIAGNOSIS — R112 Nausea with vomiting, unspecified: Secondary | ICD-10-CM | POA: Diagnosis not present

## 2016-02-04 DIAGNOSIS — Z992 Dependence on renal dialysis: Secondary | ICD-10-CM | POA: Diagnosis not present

## 2016-02-04 DIAGNOSIS — I82431 Acute embolism and thrombosis of right popliteal vein: Secondary | ICD-10-CM | POA: Diagnosis not present

## 2016-02-04 DIAGNOSIS — N2581 Secondary hyperparathyroidism of renal origin: Secondary | ICD-10-CM | POA: Diagnosis not present

## 2016-02-06 DIAGNOSIS — N2581 Secondary hyperparathyroidism of renal origin: Secondary | ICD-10-CM | POA: Diagnosis not present

## 2016-02-06 DIAGNOSIS — Z992 Dependence on renal dialysis: Secondary | ICD-10-CM | POA: Diagnosis not present

## 2016-02-06 DIAGNOSIS — N186 End stage renal disease: Secondary | ICD-10-CM | POA: Diagnosis not present

## 2016-02-06 DIAGNOSIS — D509 Iron deficiency anemia, unspecified: Secondary | ICD-10-CM | POA: Diagnosis not present

## 2016-02-06 DIAGNOSIS — R112 Nausea with vomiting, unspecified: Secondary | ICD-10-CM | POA: Diagnosis not present

## 2016-02-08 DIAGNOSIS — Z992 Dependence on renal dialysis: Secondary | ICD-10-CM | POA: Diagnosis not present

## 2016-02-08 DIAGNOSIS — N2581 Secondary hyperparathyroidism of renal origin: Secondary | ICD-10-CM | POA: Diagnosis not present

## 2016-02-08 DIAGNOSIS — D509 Iron deficiency anemia, unspecified: Secondary | ICD-10-CM | POA: Diagnosis not present

## 2016-02-08 DIAGNOSIS — R112 Nausea with vomiting, unspecified: Secondary | ICD-10-CM | POA: Diagnosis not present

## 2016-02-08 DIAGNOSIS — N186 End stage renal disease: Secondary | ICD-10-CM | POA: Diagnosis not present

## 2016-02-11 ENCOUNTER — Encounter: Payer: Self-pay | Admitting: Cardiology

## 2016-02-11 ENCOUNTER — Ambulatory Visit (INDEPENDENT_AMBULATORY_CARE_PROVIDER_SITE_OTHER): Payer: Medicare Other | Admitting: Cardiology

## 2016-02-11 ENCOUNTER — Ambulatory Visit (INDEPENDENT_AMBULATORY_CARE_PROVIDER_SITE_OTHER): Payer: Medicare Other | Admitting: *Deleted

## 2016-02-11 VITALS — BP 136/97 | HR 89 | Ht 71.0 in | Wt 268.0 lb

## 2016-02-11 DIAGNOSIS — E119 Type 2 diabetes mellitus without complications: Secondary | ICD-10-CM | POA: Diagnosis not present

## 2016-02-11 DIAGNOSIS — Z992 Dependence on renal dialysis: Secondary | ICD-10-CM | POA: Diagnosis not present

## 2016-02-11 DIAGNOSIS — N529 Male erectile dysfunction, unspecified: Secondary | ICD-10-CM

## 2016-02-11 DIAGNOSIS — I824Y9 Acute embolism and thrombosis of unspecified deep veins of unspecified proximal lower extremity: Secondary | ICD-10-CM

## 2016-02-11 DIAGNOSIS — D509 Iron deficiency anemia, unspecified: Secondary | ICD-10-CM | POA: Diagnosis not present

## 2016-02-11 DIAGNOSIS — R112 Nausea with vomiting, unspecified: Secondary | ICD-10-CM | POA: Diagnosis not present

## 2016-02-11 DIAGNOSIS — I82431 Acute embolism and thrombosis of right popliteal vein: Secondary | ICD-10-CM

## 2016-02-11 DIAGNOSIS — I10 Essential (primary) hypertension: Secondary | ICD-10-CM

## 2016-02-11 DIAGNOSIS — Z5181 Encounter for therapeutic drug level monitoring: Secondary | ICD-10-CM

## 2016-02-11 DIAGNOSIS — N186 End stage renal disease: Secondary | ICD-10-CM

## 2016-02-11 DIAGNOSIS — N2581 Secondary hyperparathyroidism of renal origin: Secondary | ICD-10-CM | POA: Diagnosis not present

## 2016-02-11 LAB — POCT INR: INR: 1.7

## 2016-02-11 NOTE — Patient Instructions (Addendum)
Your physician wants you to follow-up in: 6 months Dr Ferne Reus will receive a reminder letter in the mail two months in advance. If you don't receive a letter, please call our office to schedule the follow-up appointment.    Your INR today is 1.7   We will schedule you with Edrick Oh Coumadin nurse ASAP      Thank you for choosing [facility] !

## 2016-02-11 NOTE — Progress Notes (Signed)
Cardiology Office Note  Date: 02/11/2016   ID: Jack Irwin, DOB [birth year], MRN [id number]  PCP: [facility]  Consulting Cardiologist: Rozann Lesches, MD   Chief Complaint  Patient presents with  . Establish anticoagulation clinic    History of Present Illness: Jack Irwin is a 51 y.o. male referred for cardiology consultation by Ms. Eulas Post NP to establish follow-up in our anticoagulation clinic. Patient was diagnosed with an acute right popliteal DVT by ultrasound in mid December 2017 and was placed on Coumadin. He has been followed recently by Dr. Oneida Alar for placement of a new hemodialysis access, surgery was put on hold pending further treatment of his DVT.  I reviewed his records and updated the chart. Patient states that he did not have any injury of his right leg prior to diagnosis of DVT, but had been relatively inactive, sitting for long periods of time. Although records are not clear, he states that he had a DVT several years ago.  He reports improvement in swelling and pain in his right leg since being on Coumadin. States that he has had INRs checked through his PCP, I do not have the results.  I reviewed his medications which are outlined below.  He states that he is due to see Dr. Oneida Alar back later this month to discuss planned for hemodialysis access again.  Past Medical History:  Diagnosis Date  . Anemia   . Arthritis   . BPH (benign prostatic hyperplasia)   . Chronic back pain   . Difficulty walking   . DVT (deep venous thrombosis) ([place])    Right popliteal DVT December 2017  . End-stage renal disease on hemodialysis ([place])    Dr. Lowanda Foster  . Erectile dysfunction   . Essential hypertension   . Gout   . Neuropathy, diabetic ([place])   . Type 2 diabetes mellitus ([place])   . Venous (peripheral) insufficiency     Past Surgical History:  Procedure Laterality Date  . AV FISTULA PLACEMENT Left 03/09/2014   Procedure: INSERTION OF  ARTERIOVENOUS (AV) GORE-TEX GRAFT ARM;  Surgeon: Angelia Mould, MD;  Location: [facility];  Service: Vascular;  Laterality: Left;  . CYST EXCISION Right    cyst removed on thumb 2001  . INSERTION OF DIALYSIS CATHETER    . INSERTION OF DIALYSIS CATHETER Left 03/09/2014   Procedure: INSERTION OF DIALYSIS CATHETER;  Surgeon: Angelia Mould, MD;  Location: [facility];  Service: Vascular;  Laterality: Left;    Current Outpatient Prescriptions  Medication Sig Dispense Refill  . colchicine 0.6 MG tablet 1/2 tab po 2 times /week (Patient taking differently: Take 0.3 mg by mouth daily as needed (for gout). ) 15 tablet 0  . diclofenac sodium (VOLTAREN) 1 % GEL Apply to left wrist and hand three times daily 100 g 0  . doxycycline (VIBRA-TABS) 100 MG tablet Take 1 tablet (100 mg total) by mouth 2 (two) times daily. 20 tablet 0  . gabapentin (NEURONTIN) 100 MG capsule Take 100 mg by mouth daily.     . metoprolol tartrate (LOPRESSOR) 25 MG tablet Take 25 mg by mouth 2 (two) times daily.    . Multiple Vitamin (DAILY VITE PO) Take 1 tablet by mouth daily.    . Pseudoeph-CPM-DM-APAP (SEVERE COLD/FLU PO) Take 2 tablets by mouth as needed (for cold and flu like symptoms).    . RENVELA 800 MG tablet Take 800 mg by mouth 3 (three) times daily with meals.     [name]  sildenafil (REVATIO) 20 MG tablet Take 20 mg by mouth as needed.    [name] ULORIC 80 MG TABS Take 80 mg by mouth daily.    [name] warfarin (COUMADIN) 2.5 MG tablet Take 2.5 mg by mouth daily. Take 1/2 tablet daily     No current facility-administered medications for this visit.    Allergies:  Patient has no known allergies.   Social History: The patient  reports that he has never smoked. He has never used smokeless tobacco. He reports that he does not drink alcohol or use drugs.   Family History: The patient's family history includes Cancer in his father; Diabetes in his father and mother; Heart attack in his mother; Heart failure in his mother; Hyperlipidemia  in his father and mother; Hypertension in his father and mother.   ROS:  Please see the history of present illness. Otherwise, complete review of systems is positive for chronic fatigue with activity, pain related to gout as well as chronic lower back pain.  All other systems are reviewed and negative.   Physical Exam: VS:  BP (!) 136/97   Pulse 89   Ht 5\' 11"  (1.803 m)   Wt 268 lb (121.6 kg)   SpO2 94%   BMI 37.38 kg/m , BMI Body mass index is 37.38 kg/m.  Wt Readings from Last 3 Encounters:  02/11/16 268 lb (121.6 kg)  01/24/16 260 lb (117.9 kg)  01/21/16 260 lb (117.9 kg)    General: Morbidly obese male, appears comfortable at rest. HEENT: Conjunctiva and lids normal, oropharynx clear with poor dentition. Neck: Supple, no elevated JVP or carotid bruits, no thyromegaly. Lungs: Clear to auscultation, nonlabored breathing at rest. Cardiac: Regular rate and rhythm, no S3 soft systolic murmur, no pericardial rub. Thorax: Left-sided venous hemodialysis access catheter dressed. Abdomen: Soft, nontender, bowel sounds present, no guarding or rebound. Extremities: Trace lower leg edema, distal pulses 2+. AV fistula left upper arm without thrill or bruit. Skin: Warm and dry. Musculoskeletal: No kyphosis. Neuropsychiatric: Alert and oriented x3, affect grossly appropriate.  ECG: I personally reviewed the tracing from 11/30/2015 which showed sinus rhythm with right bundle branch block and nonspecific ST changes.   Recent Labwork: 11/01/2015: ALT 35; AST 36 01/24/2016: BUN 43; Creatinine, Ser 12.08; Hemoglobin 11.4; Platelets 129; Potassium 4.2; Sodium 138     Component Value Date/Time   CHOL 101 07/05/2015 1129   TRIG 74 07/05/2015 1129   HDL 31 (L) 07/05/2015 1129   CHOLHDL 3.3 07/05/2015 1129   VLDL 15 07/05/2015 1129   LDLCALC 55 07/05/2015 1129   Other Studies Reviewed Today:  Lower extremity ultrasound 01/24/2016: FINDINGS: Contralateral Common Femoral Vein: Respiratory  phasicity is normal and symmetric with the symptomatic side. No evidence of thrombus. Normal compressibility.  Common Femoral Vein: No evidence of thrombus. Normal compressibility, respiratory phasicity and response to augmentation.  Saphenofemoral Junction: No evidence of thrombus. Normal compressibility and flow on color Doppler imaging.  Profunda Femoral Vein: No evidence of thrombus. Normal compressibility and flow on color Doppler imaging.  Femoral Vein: No evidence of thrombus. Normal compressibility, respiratory phasicity and response to augmentation.  Popliteal Vein: Similar nonocclusive thrombus in the right popliteal vein appearing partially compressible. No propagation into the femoral vein. Phasic flow remains.  Calf Veins: Posterior tibial vein remains patent. Nonocclusive thrombus suspected in the right peroneal vein.  Superficial Great Saphenous Vein: No evidence of thrombus. Normal compressibility and flow on color Doppler imaging.  Venous Reflux:  None.  Other Findings: Subcutaneous calf  edema evident. Fatty replaced mildly prominent right inguinal lymph node measures 2.7 x 1.1 x 1.5 cm.  IMPRESSION: No significant change in nonocclusive popliteal DVT extending into peroneal vein.  Assessment and Plan:  1. Diagnosis of acute right popliteal DVT extending into the peroneal vein back in December 2017. He has been on Coumadin, and is now being established in our anticoagulation clinic for follow-up INR levels. As far as anticipated duration of anticoagulation, this is being guided by his PCP and Dr. Oneida Alar, presumably in the range of 3-6 months. Plan to get PT/INR checked and then establish a follow-up plan for surveillance.  2. End-stage renal disease on hemodialysis, currently using a temporary hemodialysis catheter with occluded left arm AV fistula. He is due to see Dr. Oneida Alar later this month to discuss options for new hemodialysis access once safe  to discontinue Coumadin.  3. Essential hypertension, currently on Lopressor.  4. Erectile dysfunction, using sildenafil when necessary. Reports no problems with this medication. He requested refills however I asked him to get this taken care of by his PCP who will be following him regularly.  Current medicines were reviewed with the patient today.  Disposition: Follow-up in 6 months.  Signed, Satira Sark, MD, [facility] 02/11/2016 8:38 AM    [facility]. [address], [place], [place] [address] Phone: [phone]; Fax: [phone]

## 2016-02-13 ENCOUNTER — Ambulatory Visit (INDEPENDENT_AMBULATORY_CARE_PROVIDER_SITE_OTHER): Payer: Medicare Other | Admitting: Urology

## 2016-02-13 DIAGNOSIS — N186 End stage renal disease: Secondary | ICD-10-CM

## 2016-02-13 DIAGNOSIS — N2581 Secondary hyperparathyroidism of renal origin: Secondary | ICD-10-CM | POA: Diagnosis not present

## 2016-02-13 DIAGNOSIS — Z992 Dependence on renal dialysis: Secondary | ICD-10-CM | POA: Diagnosis not present

## 2016-02-13 DIAGNOSIS — R112 Nausea with vomiting, unspecified: Secondary | ICD-10-CM | POA: Diagnosis not present

## 2016-02-13 DIAGNOSIS — D509 Iron deficiency anemia, unspecified: Secondary | ICD-10-CM | POA: Diagnosis not present

## 2016-02-14 ENCOUNTER — Emergency Department (HOSPITAL_COMMUNITY)
Admission: EM | Admit: 2016-02-14 | Discharge: 2016-02-14 | Disposition: A | Discharge status: Home / Self Care | Payer: Medicare Other | Attending: Emergency Medicine | Admitting: Emergency Medicine

## 2016-02-14 ENCOUNTER — Encounter (HOSPITAL_COMMUNITY): Payer: Self-pay | Admitting: *Deleted

## 2016-02-14 DIAGNOSIS — L0291 Cutaneous abscess, unspecified: Secondary | ICD-10-CM

## 2016-02-14 DIAGNOSIS — M791 Myalgia: Secondary | ICD-10-CM | POA: Diagnosis not present

## 2016-02-14 DIAGNOSIS — Z7901 Long term (current) use of anticoagulants: Secondary | ICD-10-CM | POA: Insufficient documentation

## 2016-02-14 DIAGNOSIS — Z79899 Other long term (current) drug therapy: Secondary | ICD-10-CM | POA: Diagnosis not present

## 2016-02-14 DIAGNOSIS — I120 Hypertensive chronic kidney disease with stage 5 chronic kidney disease or end stage renal disease: Secondary | ICD-10-CM | POA: Diagnosis not present

## 2016-02-14 DIAGNOSIS — E1122 Type 2 diabetes mellitus with diabetic chronic kidney disease: Secondary | ICD-10-CM | POA: Insufficient documentation

## 2016-02-14 DIAGNOSIS — L0231 Cutaneous abscess of buttock: Secondary | ICD-10-CM | POA: Diagnosis not present

## 2016-02-14 DIAGNOSIS — N186 End stage renal disease: Secondary | ICD-10-CM | POA: Diagnosis not present

## 2016-02-14 DIAGNOSIS — Z992 Dependence on renal dialysis: Secondary | ICD-10-CM | POA: Insufficient documentation

## 2016-02-14 LAB — CBC WITH DIFFERENTIAL/PLATELET
Basophils Absolute: 0 10*3/uL (ref 0.0–0.1)
Basophils Relative: 0 %
EOS ABS: 0.1 10*3/uL (ref 0.0–0.7)
Eosinophils Relative: 1 %
HCT: 42 % (ref 39.0–52.0)
Hemoglobin: 13.5 g/dL (ref 13.0–17.0)
LYMPHS ABS: 2.2 10*3/uL (ref 0.7–4.0)
LYMPHS PCT: 17 %
MCH: 29.2 pg (ref 26.0–34.0)
MCHC: 32.1 g/dL (ref 30.0–36.0)
MCV: 90.7 fL (ref 78.0–100.0)
Monocytes Absolute: 0.8 10*3/uL (ref 0.1–1.0)
Monocytes Relative: 6 %
Neutro Abs: 10.1 10*3/uL — ABNORMAL HIGH (ref 1.7–7.7)
Neutrophils Relative %: 76 %
PLATELETS: 169 10*3/uL (ref 150–400)
RBC: 4.63 MIL/uL (ref 4.22–5.81)
RDW: 16.2 % — AB (ref 11.5–15.5)
WBC: 13.2 10*3/uL — AB (ref 4.0–10.5)

## 2016-02-14 LAB — BASIC METABOLIC PANEL
Anion gap: 12 (ref 5–15)
BUN: 47 mg/dL — AB (ref 6–20)
CALCIUM: 9.3 mg/dL (ref 8.9–10.3)
CO2: 25 mmol/L (ref 22–32)
Chloride: 96 mmol/L — ABNORMAL LOW (ref 101–111)
Creatinine, Ser: 11.73 mg/dL — ABNORMAL HIGH (ref 0.61–1.24)
GFR calc Af Amer: 5 mL/min — ABNORMAL LOW (ref >60)
GFR, EST NON AFRICAN AMERICAN: 4 mL/min — AB (ref >60)
GLUCOSE: 96 mg/dL (ref 65–99)
Potassium: 4.9 mmol/L (ref 3.5–5.1)
SODIUM: 133 mmol/L — AB (ref 135–145)

## 2016-02-14 MED ORDER — LIDOCAINE HCL (PF) 2 % IJ SOLN
INTRAMUSCULAR | Status: AC
  Filled 2016-02-14: qty 10

## 2016-02-14 MED ORDER — POVIDONE-IODINE 10 % EX SOLN
CUTANEOUS | Status: AC
  Filled 2016-02-14: qty 118

## 2016-02-14 NOTE — ED Triage Notes (Signed)
Pt states he has a abscess on his buttocks and on his groin. This started 7 days ago. Denies any n/v/d. Was told to come here by his doctor.

## 2016-02-14 NOTE — ED Provider Notes (Signed)
[facility] DEPT Provider Note   CSN: [id number] Arrival date & time: 02/14/16  1350    By signing my name below, I, Jack Irwin, attest that this documentation has been prepared under the direction and in the presence of [name], MD. Electronically Signed: Macon Irwin, ED Scribe. 02/14/16. 8:11 PM.  History   Chief Complaint Chief Complaint  Patient presents with  . Abscess   The history is provided by the patient. No language interpreter was used.   HPI Comments: Jack Irwin is a 51 y.o. male with PMHx of gout, DVT, Type II DM, HTN who presents to the Emergency Department complaining of a moderate, gradually worsening area of pain and swelling to the gluteal cleft onset two days ago. He also notes having an area of swelling to his groin area. Pt states pain is exacerbated with palpation and direct pressure when sitting on his bottom. No alleviating factors noted. Per pt, he states his PCP told him to come to the ED for further evaluation. Denies fever, vomiting, drainage from the area.    Past Medical History:  Diagnosis Date  . Anemia   . Arthritis   . BPH (benign prostatic hyperplasia)   . Chronic back pain   . Difficulty walking   . DVT (deep venous thrombosis) ([place])    Right popliteal DVT December 2017  . End-stage renal disease on hemodialysis ([place])    Dr. Lowanda Foster  . Erectile dysfunction   . Essential hypertension   . Gout   . Neuropathy, diabetic ([place])   . Type 2 diabetes mellitus ([place])   . Venous (peripheral) insufficiency     Patient Active Problem List   Diagnosis Date Noted  . Acute venous embolism and thrombosis of deep vessels of proximal lower extremity ([place]) [I82.4Y9] 02/11/2016  . Encounter for therapeutic drug monitoring 02/11/2016  . Nausea & vomiting 11/17/2014  . Uremia syndrome 11/17/2014  . Uremia 11/17/2014  . Thrombocytopenia ([place]) 02/21/2014  . Morbid obesity ([place]) 03/30/2013  . RBBB 03/29/2013  . Hyperkalemia  03/28/2013  . Leukocytosis 03/28/2013  . Nausea vomiting and diarrhea 03/28/2013  . BBB (bundle branch block) 03/28/2013  . Hypertension   . ESRD on dialysis ([place])   . Lack of coordination 04/15/2012  . Muscle weakness (generalized) 04/15/2012  . Arthritis, gouty 04/15/2012  . Difficulty in walking(719.7) 08/18/2011  . Weakness of both legs 08/18/2011  . Poor balance 08/18/2011    Past Surgical History:  Procedure Laterality Date  . AV FISTULA PLACEMENT Left 03/09/2014   Procedure: INSERTION OF ARTERIOVENOUS (AV) GORE-TEX GRAFT ARM;  Surgeon: Angelia Mould, MD;  Location: [facility];  Service: Vascular;  Laterality: Left;  . CYST EXCISION Right    cyst removed on thumb 2001  . INSERTION OF DIALYSIS CATHETER    . INSERTION OF DIALYSIS CATHETER Left 03/09/2014   Procedure: INSERTION OF DIALYSIS CATHETER;  Surgeon: Angelia Mould, MD;  Location: [facility];  Service: Vascular;  Laterality: Left;       Home Medications    Prior to Admission medications   Medication Sig Start Date End Date Taking? Authorizing Provider  colchicine 0.6 MG tablet 1/2 tab po 2 times /week Patient taking differently: Take 0.3 mg by mouth daily as needed (for gout).  08/15/15   Lily Kocher, PA-C  diclofenac sodium (VOLTAREN) 1 % GEL Apply to left wrist and hand three times daily 04/13/14   Lily Kocher, PA-C  doxycycline (VIBRA-TABS) 100 MG tablet Take 1 tablet (100 mg  total) by mouth 2 (two) times daily. 01/24/16   Francine Graven, DO  gabapentin (NEURONTIN) 100 MG capsule Take 100 mg by mouth daily.  07/11/14   Historical Provider, MD  metoprolol tartrate (LOPRESSOR) 25 MG tablet Take 25 mg by mouth 2 (two) times daily.    Historical Provider, MD  Multiple Vitamin (DAILY VITE PO) Take 1 tablet by mouth daily.    Historical Provider, MD  Pseudoeph-CPM-DM-APAP (SEVERE COLD/FLU PO) Take 2 tablets by mouth as needed (for cold and flu like symptoms).    Historical Provider, MD  RENVELA 800 MG tablet Take  800 mg by mouth 3 (three) times daily with meals.  07/18/14   Historical Provider, MD  sildenafil (REVATIO) 20 MG tablet Take 20 mg by mouth as needed.    Historical Provider, MD  ULORIC 80 MG TABS Take 80 mg by mouth daily. 09/26/14   Historical Provider, MD  warfarin (COUMADIN) 2.5 MG tablet Take 2.5 mg by mouth daily. Take 1/2 tablet daily    Historical Provider, MD    Family History Family History  Problem Relation Age of Onset  . Diabetes Mother   . Hypertension Mother   . Heart failure Mother   . Hyperlipidemia Mother   . Heart attack Mother   . Cancer Father   . Diabetes Father   . Hypertension Father   . Hyperlipidemia Father     Social History Social History  Substance Use Topics  . Smoking status: Never Smoker  . Smokeless tobacco: Never Used  . Alcohol use No     Allergies   Patient has no known allergies.   Review of Systems Review of Systems  Constitutional: Negative for fever.  Gastrointestinal: Negative for vomiting.  Musculoskeletal: Positive for myalgias.  Skin: Positive for color change (redness).  All other systems reviewed and are negative.    Physical Exam Updated Vital Signs BP 104/75   Pulse 90   Temp 98.5 F (36.9 C) (Oral)   Resp 20   Ht 5\' 11"  (1.803 m)   Wt 121.6 kg   SpO2 99%   BMI 37.38 kg/m   Physical Exam  Constitutional: He appears well-developed and well-nourished. No distress.  HENT:  Head: Normocephalic and atraumatic.  Right Ear: External ear normal.  Left Ear: External ear normal.  Eyes: Conjunctivae are normal. Right eye exhibits no discharge. Left eye exhibits no discharge. No scleral icterus.  Neck: Neck supple. No tracheal deviation present.  Cardiovascular: Normal rate.   Pulmonary/Chest: Effort normal. No stridor. No respiratory distress.  Abdominal: He exhibits no distension.  Genitourinary:  Genitourinary Comments: No abnormality to scrotum or inguinal region. Small area of induration proximal aspect of the  gluteal cleft with tenderness. No erythema or fluctuance.   Musculoskeletal: He exhibits no edema.  Neurological: He is alert. Cranial nerve deficit: no gross deficits.  Skin: Skin is warm and dry. No rash noted.  Psychiatric: He has a normal mood and affect.  Nursing note and vitals reviewed.    ED Treatments / Results   DIAGNOSTIC STUDIES: Oxygen Saturation is 100% on RA, normal by my interpretation.    COORDINATION OF CARE: 8:12 PM Discussed treatment plan with pt at bedside which includes [place] and pt agreed to plan.   Labs (all labs ordered are listed, but only abnormal results are displayed) Labs Reviewed  CBC WITH DIFFERENTIAL/PLATELET - Abnormal; Notable for the following:       Result Value   WBC 13.2 (*)    RDW  16.2 (*)    Neutro Abs 10.1 (*)    All other components within normal limits  BASIC METABOLIC PANEL - Abnormal; Notable for the following:    Sodium 133 (*)    Chloride 96 (*)    BUN 47 (*)    Creatinine, Ser 11.73 (*)    GFR calc non Af Amer 4 (*)    GFR calc Af Amer 5 (*)    All other components within normal limits    EKG  EKG Interpretation None       Radiology No results found.  Procedures .Marland KitchenIncision and Drainage Date/Time: 02/14/2016 9:09 PM Performed by: [name] Authorized by: [name]   Consent:    Consent obtained:  Verbal   Consent given by:  Patient   Risks discussed:  Bleeding, incomplete drainage, pain and damage to other organs   Alternatives discussed:  No treatment Location:    Type:  Abscess   Location: buttock. Pre-procedure details:    Skin preparation:  Chloraprep Anesthesia (see MAR for exact dosages):    Anesthesia method:  Local infiltration   Local anesthetic:  Lidocaine 2% w/o epi Procedure type:    Complexity:  Complex Procedure details:    Incision types:  Single straight   Incision depth:  Subcutaneous   Scalpel blade:  11   Wound management:  Probed and deloculated, irrigated with saline and  extensive cleaning   Drainage:  Purulent   Drainage amount:  Scant   Packing materials:  1/4 in gauze Post-procedure details:    Patient tolerance of procedure:  Tolerated well, no immediate complications   (including critical care time)  Medications Ordered in ED Medications  lidocaine (XYLOCAINE) 2 % injection (not administered)  povidone-iodine (BETADINE) 10 % external solution (not administered)     Initial Impression / Assessment and Plan / ED Course  I have reviewed the triage vital signs and the nursing notes.  Pertinent labs & imaging results that were available during my care of the patient were reviewed by me and considered in my medical decision making (see chart for details).  Clinical Course   Bedside US to assist with abscess location.  Small pocket identified.  I*D performed without difficulty.  Continue warm soak.  Packing removal in 48 hours  Final Clinical Impressions(s) / ED Diagnoses   Final diagnoses:  Abscess    New Prescriptions New Prescriptions   No medications on file    I personally performed the services described in this documentation, which was scribed in my presence.  The recorded information has been reviewed and is accurate.     [name], MD 02/14/16 2111

## 2016-02-14 NOTE — Discharge Instructions (Signed)
Follow-up with your primary care doctor, apply warm compresses or soak in a sitz bath, packing removal in 48 hours, monitor for worsening signs of infection, fever, drainage

## 2016-02-16 DIAGNOSIS — Z992 Dependence on renal dialysis: Secondary | ICD-10-CM | POA: Diagnosis not present

## 2016-02-16 DIAGNOSIS — N186 End stage renal disease: Secondary | ICD-10-CM | POA: Diagnosis not present

## 2016-02-16 DIAGNOSIS — R112 Nausea with vomiting, unspecified: Secondary | ICD-10-CM | POA: Diagnosis not present

## 2016-02-16 DIAGNOSIS — N2581 Secondary hyperparathyroidism of renal origin: Secondary | ICD-10-CM | POA: Diagnosis not present

## 2016-02-16 DIAGNOSIS — D509 Iron deficiency anemia, unspecified: Secondary | ICD-10-CM | POA: Diagnosis not present

## 2016-02-18 ENCOUNTER — Ambulatory Visit (INDEPENDENT_AMBULATORY_CARE_PROVIDER_SITE_OTHER): Payer: Medicare Other | Admitting: *Deleted

## 2016-02-18 DIAGNOSIS — R112 Nausea with vomiting, unspecified: Secondary | ICD-10-CM | POA: Diagnosis not present

## 2016-02-18 DIAGNOSIS — Z5181 Encounter for therapeutic drug level monitoring: Secondary | ICD-10-CM

## 2016-02-18 DIAGNOSIS — Z992 Dependence on renal dialysis: Secondary | ICD-10-CM | POA: Diagnosis not present

## 2016-02-18 DIAGNOSIS — N2581 Secondary hyperparathyroidism of renal origin: Secondary | ICD-10-CM | POA: Diagnosis not present

## 2016-02-18 DIAGNOSIS — D509 Iron deficiency anemia, unspecified: Secondary | ICD-10-CM | POA: Diagnosis not present

## 2016-02-18 DIAGNOSIS — I824Y9 Acute embolism and thrombosis of unspecified deep veins of unspecified proximal lower extremity: Secondary | ICD-10-CM | POA: Diagnosis not present

## 2016-02-18 DIAGNOSIS — N186 End stage renal disease: Secondary | ICD-10-CM | POA: Diagnosis not present

## 2016-02-18 LAB — POCT INR: INR: 1.7

## 2016-02-20 DIAGNOSIS — R112 Nausea with vomiting, unspecified: Secondary | ICD-10-CM | POA: Diagnosis not present

## 2016-02-20 DIAGNOSIS — N2581 Secondary hyperparathyroidism of renal origin: Secondary | ICD-10-CM | POA: Diagnosis not present

## 2016-02-20 DIAGNOSIS — Z992 Dependence on renal dialysis: Secondary | ICD-10-CM | POA: Diagnosis not present

## 2016-02-20 DIAGNOSIS — N186 End stage renal disease: Secondary | ICD-10-CM | POA: Diagnosis not present

## 2016-02-20 DIAGNOSIS — D509 Iron deficiency anemia, unspecified: Secondary | ICD-10-CM | POA: Diagnosis not present

## 2016-02-21 ENCOUNTER — Encounter: Payer: Self-pay | Admitting: Vascular Surgery

## 2016-02-22 DIAGNOSIS — N186 End stage renal disease: Secondary | ICD-10-CM | POA: Diagnosis not present

## 2016-02-22 DIAGNOSIS — D509 Iron deficiency anemia, unspecified: Secondary | ICD-10-CM | POA: Diagnosis not present

## 2016-02-22 DIAGNOSIS — R112 Nausea with vomiting, unspecified: Secondary | ICD-10-CM | POA: Diagnosis not present

## 2016-02-22 DIAGNOSIS — N2581 Secondary hyperparathyroidism of renal origin: Secondary | ICD-10-CM | POA: Diagnosis not present

## 2016-02-22 DIAGNOSIS — Z992 Dependence on renal dialysis: Secondary | ICD-10-CM | POA: Diagnosis not present

## 2016-02-25 ENCOUNTER — Inpatient Hospital Stay (HOSPITAL_COMMUNITY)
Admission: EM | Admit: 2016-02-25 | Discharge: 2016-02-29 | DRG: 727 | Disposition: A | Discharge status: Home / Self Care | Payer: Medicare Other | Attending: Internal Medicine | Admitting: Internal Medicine

## 2016-02-25 ENCOUNTER — Encounter (HOSPITAL_COMMUNITY)
Admission: EM | Disposition: A | Discharge status: Home / Self Care | Payer: Self-pay | Source: Home / Self Care | Attending: Internal Medicine

## 2016-02-25 ENCOUNTER — Encounter (HOSPITAL_COMMUNITY): Payer: Self-pay | Admitting: Emergency Medicine

## 2016-02-25 ENCOUNTER — Observation Stay (HOSPITAL_COMMUNITY): Payer: Medicare Other | Admitting: Certified Registered"

## 2016-02-25 ENCOUNTER — Emergency Department (HOSPITAL_COMMUNITY): Payer: Medicare Other

## 2016-02-25 DIAGNOSIS — N400 Enlarged prostate without lower urinary tract symptoms: Secondary | ICD-10-CM | POA: Diagnosis present

## 2016-02-25 DIAGNOSIS — N454 Abscess of epididymis or testis: Secondary | ICD-10-CM | POA: Diagnosis not present

## 2016-02-25 DIAGNOSIS — M549 Dorsalgia, unspecified: Secondary | ICD-10-CM | POA: Diagnosis present

## 2016-02-25 DIAGNOSIS — M109 Gout, unspecified: Secondary | ICD-10-CM | POA: Diagnosis present

## 2016-02-25 DIAGNOSIS — R531 Weakness: Secondary | ICD-10-CM | POA: Diagnosis not present

## 2016-02-25 DIAGNOSIS — L0292 Furuncle, unspecified: Secondary | ICD-10-CM | POA: Diagnosis not present

## 2016-02-25 DIAGNOSIS — N492 Inflammatory disorders of scrotum: Secondary | ICD-10-CM | POA: Diagnosis present

## 2016-02-25 DIAGNOSIS — Z8249 Family history of ischemic heart disease and other diseases of the circulatory system: Secondary | ICD-10-CM

## 2016-02-25 DIAGNOSIS — Z992 Dependence on renal dialysis: Secondary | ICD-10-CM | POA: Diagnosis not present

## 2016-02-25 DIAGNOSIS — Z86718 Personal history of other venous thrombosis and embolism: Secondary | ICD-10-CM

## 2016-02-25 DIAGNOSIS — I10 Essential (primary) hypertension: Secondary | ICD-10-CM | POA: Diagnosis present

## 2016-02-25 DIAGNOSIS — G8929 Other chronic pain: Secondary | ICD-10-CM | POA: Diagnosis present

## 2016-02-25 DIAGNOSIS — M1990 Unspecified osteoarthritis, unspecified site: Secondary | ICD-10-CM | POA: Diagnosis present

## 2016-02-25 DIAGNOSIS — Z809 Family history of malignant neoplasm, unspecified: Secondary | ICD-10-CM

## 2016-02-25 DIAGNOSIS — E119 Type 2 diabetes mellitus without complications: Secondary | ICD-10-CM

## 2016-02-25 DIAGNOSIS — E875 Hyperkalemia: Secondary | ICD-10-CM | POA: Diagnosis present

## 2016-02-25 DIAGNOSIS — E11649 Type 2 diabetes mellitus with hypoglycemia without coma: Secondary | ICD-10-CM | POA: Diagnosis present

## 2016-02-25 DIAGNOSIS — I120 Hypertensive chronic kidney disease with stage 5 chronic kidney disease or end stage renal disease: Secondary | ICD-10-CM | POA: Diagnosis present

## 2016-02-25 DIAGNOSIS — E1140 Type 2 diabetes mellitus with diabetic neuropathy, unspecified: Secondary | ICD-10-CM | POA: Diagnosis present

## 2016-02-25 DIAGNOSIS — L02214 Cutaneous abscess of groin: Secondary | ICD-10-CM | POA: Diagnosis present

## 2016-02-25 DIAGNOSIS — N186 End stage renal disease: Secondary | ICD-10-CM | POA: Diagnosis not present

## 2016-02-25 DIAGNOSIS — Z6836 Body mass index (BMI) 36.0-36.9, adult: Secondary | ICD-10-CM

## 2016-02-25 DIAGNOSIS — Z7901 Long term (current) use of anticoagulants: Secondary | ICD-10-CM

## 2016-02-25 DIAGNOSIS — D72829 Elevated white blood cell count, unspecified: Secondary | ICD-10-CM | POA: Diagnosis not present

## 2016-02-25 DIAGNOSIS — E1122 Type 2 diabetes mellitus with diabetic chronic kidney disease: Secondary | ICD-10-CM | POA: Diagnosis present

## 2016-02-25 DIAGNOSIS — Z833 Family history of diabetes mellitus: Secondary | ICD-10-CM | POA: Diagnosis not present

## 2016-02-25 DIAGNOSIS — E118 Type 2 diabetes mellitus with unspecified complications: Secondary | ICD-10-CM | POA: Diagnosis not present

## 2016-02-25 DIAGNOSIS — L0291 Cutaneous abscess, unspecified: Secondary | ICD-10-CM | POA: Diagnosis not present

## 2016-02-25 DIAGNOSIS — I4510 Unspecified right bundle-branch block: Secondary | ICD-10-CM | POA: Diagnosis present

## 2016-02-25 HISTORY — PX: IRRIGATION AND DEBRIDEMENT ABSCESS: SHX5252

## 2016-02-25 LAB — PROTIME-INR
INR: 2.78
PROTHROMBIN TIME: 29.9 s — AB (ref 11.4–15.2)

## 2016-02-25 LAB — GLUCOSE, CAPILLARY
Glucose-Capillary: 128 mg/dL — ABNORMAL HIGH (ref 65–99)
Glucose-Capillary: 55 mg/dL — ABNORMAL LOW (ref 65–99)
Glucose-Capillary: 83 mg/dL (ref 65–99)
Glucose-Capillary: <10 mg/dL — CL (ref 65–99)

## 2016-02-25 LAB — BASIC METABOLIC PANEL
ANION GAP: 13 (ref 5–15)
BUN: 64 mg/dL — ABNORMAL HIGH (ref 6–20)
CALCIUM: 8.8 mg/dL — AB (ref 8.9–10.3)
CO2: 24 mmol/L (ref 22–32)
Chloride: 98 mmol/L — ABNORMAL LOW (ref 101–111)
Creatinine, Ser: 16.86 mg/dL — ABNORMAL HIGH (ref 0.61–1.24)
GFR calc non Af Amer: 3 mL/min — ABNORMAL LOW (ref >60)
GFR, EST AFRICAN AMERICAN: 3 mL/min — AB (ref >60)
Glucose, Bld: 88 mg/dL (ref 65–99)
Potassium: 4.9 mmol/L (ref 3.5–5.1)
SODIUM: 135 mmol/L (ref 135–145)

## 2016-02-25 LAB — CBC WITH DIFFERENTIAL/PLATELET
BASOS ABS: 0 10*3/uL (ref 0.0–0.1)
BASOS PCT: 0 %
EOS ABS: 0.1 10*3/uL (ref 0.0–0.7)
Eosinophils Relative: 1 %
HEMATOCRIT: 32.8 % — AB (ref 39.0–52.0)
HEMOGLOBIN: 10.8 g/dL — AB (ref 13.0–17.0)
Lymphocytes Relative: 8 %
Lymphs Abs: 1.6 10*3/uL (ref 0.7–4.0)
MCH: 29.5 pg (ref 26.0–34.0)
MCHC: 32.9 g/dL (ref 30.0–36.0)
MCV: 89.6 fL (ref 78.0–100.0)
MONOS PCT: 7 %
Monocytes Absolute: 1.4 10*3/uL — ABNORMAL HIGH (ref 0.1–1.0)
NEUTROS ABS: 17.3 10*3/uL — AB (ref 1.7–7.7)
NEUTROS PCT: 84 %
Platelets: 143 10*3/uL — ABNORMAL LOW (ref 150–400)
RBC: 3.66 MIL/uL — AB (ref 4.22–5.81)
RDW: 16.4 % — ABNORMAL HIGH (ref 11.5–15.5)
WBC: 20.4 10*3/uL — AB (ref 4.0–10.5)

## 2016-02-25 LAB — CBG MONITORING, ED
GLUCOSE-CAPILLARY: 40 mg/dL — AB (ref 65–99)
GLUCOSE-CAPILLARY: 75 mg/dL (ref 65–99)

## 2016-02-25 SURGERY — IRRIGATION AND DEBRIDEMENT ABSCESS
Anesthesia: General

## 2016-02-25 MED ORDER — ONDANSETRON HCL 4 MG/2ML IJ SOLN
INTRAMUSCULAR | Status: DC | PRN
  Administered 2016-02-25: 4 mg via INTRAVENOUS

## 2016-02-25 MED ORDER — MORPHINE SULFATE (PF) 4 MG/ML IV SOLN
4.0000 mg | INTRAVENOUS | Status: DC | PRN
  Administered 2016-02-26 – 2016-02-27 (×6): 4 mg via INTRAVENOUS
  Filled 2016-02-25 (×7): qty 1

## 2016-02-25 MED ORDER — DEXTROSE 50 % IV SOLN: 25.0000 mL | Freq: Once | INTRAVENOUS | Status: DC

## 2016-02-25 MED ORDER — MIDAZOLAM HCL 5 MG/5ML IJ SOLN
INTRAMUSCULAR | Status: DC | PRN
  Administered 2016-02-25: 2 mg via INTRAVENOUS

## 2016-02-25 MED ORDER — PROPOFOL 10 MG/ML IV BOLUS
INTRAVENOUS | Status: DC | PRN
  Administered 2016-02-25: 180 mg via INTRAVENOUS

## 2016-02-25 MED ORDER — IBUPROFEN 800 MG PO TABS
800.0000 mg | ORAL_TABLET | Freq: Once | ORAL | Status: DC
  Filled 2016-02-25: qty 1

## 2016-02-25 MED ORDER — SODIUM CHLORIDE 0.9 % IV SOLN
INTRAVENOUS | Status: DC | PRN
  Administered 2016-02-25: 20:00:00 via INTRAVENOUS

## 2016-02-25 MED ORDER — MORPHINE SULFATE (PF) 4 MG/ML IV SOLN
4.0000 mg | Freq: Once | INTRAVENOUS | Status: AC
  Administered 2016-02-25: 4 mg via INTRAVENOUS
  Filled 2016-02-25: qty 1

## 2016-02-25 MED ORDER — COLCHICINE 0.6 MG PO TABS: 0.3000 mg | ORAL_TABLET | Freq: Every day | ORAL | Status: DC | PRN

## 2016-02-25 MED ORDER — ONDANSETRON HCL 4 MG/2ML IJ SOLN
INTRAMUSCULAR | Status: AC
  Filled 2016-02-25: qty 2

## 2016-02-25 MED ORDER — DEXTROSE 10 % IV SOLN
INTRAVENOUS | Status: DC
  Administered 2016-02-26 – 2016-02-28 (×2): via INTRAVENOUS
  Filled 2016-02-25: qty 1000

## 2016-02-25 MED ORDER — DEXTROSE 50 % IV SOLN
INTRAVENOUS | Status: AC
  Filled 2016-02-25: qty 50

## 2016-02-25 MED ORDER — FENTANYL CITRATE (PF) 250 MCG/5ML IJ SOLN
INTRAMUSCULAR | Status: AC
  Filled 2016-02-25: qty 5

## 2016-02-25 MED ORDER — MORPHINE SULFATE (PF) 4 MG/ML IV SOLN
4.0000 mg | Freq: Once | INTRAVENOUS | Status: DC
  Filled 2016-02-25: qty 1

## 2016-02-25 MED ORDER — NEOSTIGMINE METHYLSULFATE 5 MG/5ML IV SOSY
PREFILLED_SYRINGE | INTRAVENOUS | Status: DC | PRN
  Administered 2016-02-25: 5 mg via INTRAVENOUS

## 2016-02-25 MED ORDER — FENTANYL CITRATE (PF) 100 MCG/2ML IJ SOLN
INTRAMUSCULAR | Status: DC | PRN
  Administered 2016-02-25: 50 ug via INTRAVENOUS
  Administered 2016-02-25 (×2): 100 ug via INTRAVENOUS

## 2016-02-25 MED ORDER — MIDAZOLAM HCL 2 MG/2ML IJ SOLN
INTRAMUSCULAR | Status: AC
  Filled 2016-02-25: qty 2

## 2016-02-25 MED ORDER — PROPOFOL 10 MG/ML IV BOLUS
INTRAVENOUS | Status: AC
  Filled 2016-02-25: qty 20

## 2016-02-25 MED ORDER — GLYCOPYRROLATE 0.2 MG/ML IV SOSY
PREFILLED_SYRINGE | INTRAVENOUS | Status: AC
  Filled 2016-02-25: qty 9

## 2016-02-25 MED ORDER — FENTANYL CITRATE (PF) 100 MCG/2ML IJ SOLN
INTRAMUSCULAR | Status: AC
  Filled 2016-02-25: qty 2

## 2016-02-25 MED ORDER — DEXTROSE 50 % IV SOLN
50.0000 mL | Freq: Once | INTRAVENOUS | Status: AC
  Administered 2016-02-25: 50 mL via INTRAVENOUS

## 2016-02-25 MED ORDER — DEXTROSE 50 % IV SOLN
INTRAVENOUS | Status: AC
  Administered 2016-02-25: 50 mL via INTRAVENOUS
  Filled 2016-02-25: qty 50

## 2016-02-25 MED ORDER — ONDANSETRON HCL 4 MG/2ML IJ SOLN: 4.0000 mg | Freq: Four times a day (QID) | INTRAMUSCULAR | Status: DC | PRN

## 2016-02-25 MED ORDER — PROMETHAZINE HCL 25 MG/ML IJ SOLN: 6.2500 mg | INTRAMUSCULAR | Status: DC | PRN

## 2016-02-25 MED ORDER — PIPERACILLIN-TAZOBACTAM IN DEX 2-0.25 GM/50ML IV SOLN
2.2500 g | Freq: Three times a day (TID) | INTRAVENOUS | Status: DC
  Administered 2016-02-26 (×2): 2.25 g via INTRAVENOUS
  Filled 2016-02-25 (×3): qty 50

## 2016-02-25 MED ORDER — PIPERACILLIN SOD-TAZOBACTAM SO 2.25 (2-0.25) G IV SOLR
2.2500 g | Freq: Once | INTRAVENOUS | Status: AC
  Administered 2016-02-25: 2.25 g via INTRAVENOUS
  Filled 2016-02-25: qty 2.25

## 2016-02-25 MED ORDER — SUCCINYLCHOLINE CHLORIDE 200 MG/10ML IV SOSY
PREFILLED_SYRINGE | INTRAVENOUS | Status: AC
  Filled 2016-02-25: qty 10

## 2016-02-25 MED ORDER — FENTANYL CITRATE (PF) 100 MCG/2ML IJ SOLN
25.0000 ug | INTRAMUSCULAR | Status: DC | PRN
  Administered 2016-02-25: 25 ug via INTRAVENOUS

## 2016-02-25 MED ORDER — IOPAMIDOL (ISOVUE-300) INJECTION 61%
100.0000 mL | Freq: Once | INTRAVENOUS | Status: AC | PRN
  Administered 2016-02-25: 100 mL via INTRAVENOUS

## 2016-02-25 MED ORDER — SODIUM CHLORIDE 0.9 % IV SOLN: INTRAVENOUS | Status: DC

## 2016-02-25 MED ORDER — ROCURONIUM BROMIDE 50 MG/5ML IV SOSY
PREFILLED_SYRINGE | INTRAVENOUS | Status: AC
  Filled 2016-02-25: qty 5

## 2016-02-25 MED ORDER — HYDROMORPHONE HCL 1 MG/ML IJ SOLN: 0.2500 mg | INTRAMUSCULAR | Status: DC | PRN

## 2016-02-25 MED ORDER — FEBUXOSTAT 40 MG PO TABS
80.0000 mg | ORAL_TABLET | Freq: Every day | ORAL | Status: DC
  Administered 2016-02-26 – 2016-02-27 (×2): 80 mg via ORAL
  Filled 2016-02-25 (×3): qty 2

## 2016-02-25 MED ORDER — PIPERACILLIN-TAZOBACTAM IN DEX 2-0.25 GM/50ML IV SOLN
2.2500 g | Freq: Once | INTRAVENOUS | Status: DC
  Filled 2016-02-25: qty 50

## 2016-02-25 MED ORDER — GLYCOPYRROLATE 0.2 MG/ML IV SOSY
PREFILLED_SYRINGE | INTRAVENOUS | Status: DC | PRN
  Administered 2016-02-25: 0.6 mg via INTRAVENOUS

## 2016-02-25 MED ORDER — LIDOCAINE 2% (20 MG/ML) 5 ML SYRINGE
INTRAMUSCULAR | Status: DC | PRN
  Administered 2016-02-25: 80 mg via INTRAVENOUS

## 2016-02-25 MED ORDER — SUCCINYLCHOLINE CHLORIDE 200 MG/10ML IV SOSY
PREFILLED_SYRINGE | INTRAVENOUS | Status: DC | PRN
  Administered 2016-02-25: 160 mg via INTRAVENOUS

## 2016-02-25 MED ORDER — VANCOMYCIN HCL 10 G IV SOLR
2000.0000 mg | Freq: Once | INTRAVENOUS | Status: AC
  Administered 2016-02-25: 2000 mg via INTRAVENOUS
  Filled 2016-02-25: qty 2000

## 2016-02-25 MED ORDER — FENTANYL CITRATE (PF) 100 MCG/2ML IJ SOLN: 25.0000 ug | INTRAMUSCULAR | Status: DC | PRN

## 2016-02-25 MED ORDER — ROCURONIUM BROMIDE 10 MG/ML (PF) SYRINGE
PREFILLED_SYRINGE | INTRAVENOUS | Status: DC | PRN
  Administered 2016-02-25: 30 mg via INTRAVENOUS

## 2016-02-25 MED ORDER — GABAPENTIN 100 MG PO CAPS
100.0000 mg | ORAL_CAPSULE | Freq: Every day | ORAL | Status: DC
  Administered 2016-02-26 – 2016-02-29 (×4): 100 mg via ORAL
  Filled 2016-02-25 (×4): qty 1

## 2016-02-25 MED ORDER — LIDOCAINE 2% (20 MG/ML) 5 ML SYRINGE
INTRAMUSCULAR | Status: AC
  Filled 2016-02-25: qty 5

## 2016-02-25 MED ORDER — NEOSTIGMINE METHYLSULFATE 5 MG/5ML IV SOSY
PREFILLED_SYRINGE | INTRAVENOUS | Status: AC
  Filled 2016-02-25: qty 5

## 2016-02-25 SURGICAL SUPPLY — 20 items
BLADE HEX COATED 2.75 (ELECTRODE) ×1 IMPLANT
BNDG GAUZE ELAST 4 BULKY (GAUZE/BANDAGES/DRESSINGS) ×1 IMPLANT
COVER SURGICAL LIGHT HANDLE (MISCELLANEOUS) ×1 IMPLANT
DRAPE LAPAROTOMY T 98X78 PEDS (DRAPES) ×2 IMPLANT
ELECT REM PT RETURN 9FT ADLT (ELECTROSURGICAL) ×2
ELECTRODE REM PT RTRN 9FT ADLT (ELECTROSURGICAL) ×1 IMPLANT
GLOVE BIO SURGEON STRL SZ7.5 (GLOVE) ×5 IMPLANT
GOWN STRL REUS W/TWL LRG LVL3 (GOWN DISPOSABLE) ×3 IMPLANT
KIT BASIN OR (CUSTOM PROCEDURE TRAY) ×2 IMPLANT
NEEDLE HYPO 22GX1.5 SAFETY (NEEDLE) IMPLANT
NS IRRIG 1000ML POUR BTL (IV SOLUTION) ×2 IMPLANT
PACK GENERAL/GYN (CUSTOM PROCEDURE TRAY) ×2 IMPLANT
SUPPORT SCROTAL LG STRP (MISCELLANEOUS) ×1 IMPLANT
SUT CHROMIC 3 0 SH 27 (SUTURE) ×2 IMPLANT
SUT VIC AB 2-0 UR5 27 (SUTURE) IMPLANT
SUT VICRYL 0 TIES 12 18 (SUTURE) IMPLANT
SYR CONTROL 10ML LL (SYRINGE) IMPLANT
TOWEL OR 17X26 10 PK STRL BLUE (TOWEL DISPOSABLE) ×3 IMPLANT
TOWEL OR NON WOVEN STRL DISP B (DISPOSABLE) ×2 IMPLANT
WATER STERILE IRR 1500ML POUR (IV SOLUTION) ×1 IMPLANT

## 2016-02-25 NOTE — ED Provider Notes (Signed)
18:20 - He has arrived from [facility] emergency department to see the urologist here, Dr. Pilar Jarvis. He is here for evaluation of a scrotal abscess. He has been seen by urology and they plan to take him to the operating room. He had a low blood sugar, which was treated with graham crackers and peanut butter, about 1 hour ago. Will repeat CBG, and this patient nothing by mouth from this point forward. The patient states he last dialyzed, on 02/22/2016.  At this time, the patient is alert, cooperative.He states he's having pain in his scrotum, but denies other problems at this time.    Medical decision making: genital/scrotum infection. Patient not compliant with dialysis, missed the procedure today, but has normal potassium and normal oxygenation. Patient appears very stable for a urologic procedure to be done essentially under observation, and can likely be dialyzed tomorrow at his usual facility.  Repeat CBG, at 18:28- 75 mg/dl  6:28 PM-Consult complete with hospitalist. Patient case explained and discussed. He agrees to admit patient for further evaluation and treatment. Call ended at 18:43  Medications  morphine 4 MG/ML injection 4 mg (4 mg Intravenous Given 02/25/16 1101)  piperacillin-tazobactam (ZOSYN) 2.25 g in dextrose 5 % 50 mL IVPB (0 g Intravenous Stopped 02/25/16 1802)  vancomycin (VANCOCIN) 2,000 mg in sodium chloride 0.9 % 500 mL IVPB (2,000 mg Intravenous New Bag/Given 02/25/16 1151)  iopamidol (ISOVUE-300) 61 % injection 100 mL (100 mLs Intravenous Contrast Given 02/25/16 1445)  morphine 4 MG/ML injection 4 mg (4 mg Intravenous Given 02/25/16 1604)  dextrose 50 % solution 50 mL (50 mLs Intravenous Given 02/25/16 1637)    Patient Vitals for the past 24 hrs:  BP Temp Temp src Pulse Resp SpO2 Height Weight  02/25/16 1827 112/72 - - 61 15 95 % - -  02/25/16 1608 116/81 98.5 F (36.9 C) - 94 18 97 % - -  02/25/16 1416 97/74 98.2 F (36.8 C) Oral 84 18 99 % - -  02/25/16 0822 128/80  98.2 F (36.8 C) - 113 20 96 % 5\' 11"  (1.803 m) 262 lb (118.8 kg)       BUN  Date Value Ref Range Status  02/25/2016 64 (H) 6 - 20 mg/dL Final  02/14/2016 47 (H) 6 - 20 mg/dL Final  01/24/2016 43 (H) 6 - 20 mg/dL Final  11/30/2015 44 (H) 6 - 20 mg/dL Final   Creatinine, Ser  Date Value Ref Range Status  02/25/2016 16.86 (H) 0.61 - 1.24 mg/dL Final  02/14/2016 11.73 (H) 0.61 - 1.24 mg/dL Final  01/24/2016 12.08 (H) 0.61 - 1.24 mg/dL Final  11/30/2015 14.53 (H) 0.61 - 1.24 mg/dL Final   Results for orders placed or performed during the hospital encounter of 47/82/95  Basic metabolic panel  Result Value Ref Range   Sodium 135 135 - 145 mmol/L   Potassium 4.9 3.5 - 5.1 mmol/L   Chloride 98 (L) 101 - 111 mmol/L   CO2 24 22 - 32 mmol/L   Glucose, Bld 88 65 - 99 mg/dL   BUN 64 (H) 6 - 20 mg/dL   Creatinine, Ser 16.86 (H) 0.61 - 1.24 mg/dL   Calcium 8.8 (L) 8.9 - 10.3 mg/dL   GFR calc non Af Amer 3 (L) >60 mL/min   GFR calc Af Amer 3 (L) >60 mL/min   Anion gap 13 5 - 15  CBC with Differential  Result Value Ref Range   WBC 20.4 (H) 4.0 - 10.5 K/uL  RBC 3.66 (L) 4.22 - 5.81 MIL/uL   Hemoglobin 10.8 (L) 13.0 - 17.0 g/dL   HCT 32.8 (L) 39.0 - 52.0 %   MCV 89.6 78.0 - 100.0 fL   MCH 29.5 26.0 - 34.0 pg   MCHC 32.9 30.0 - 36.0 g/dL   RDW 16.4 (H) 11.5 - 15.5 %   Platelets 143 (L) 150 - 400 K/uL   Neutrophils Relative % 84 %   Neutro Abs 17.3 (H) 1.7 - 7.7 K/uL   Lymphocytes Relative 8 %   Lymphs Abs 1.6 0.7 - 4.0 K/uL   Monocytes Relative 7 %   Monocytes Absolute 1.4 (H) 0.1 - 1.0 K/uL   Eosinophils Relative 1 %   Eosinophils Absolute 0.1 0.0 - 0.7 K/uL   Basophils Relative 0 %   Basophils Absolute 0.0 0.0 - 0.1 K/uL  Protime-INR  Result Value Ref Range   Prothrombin Time 29.9 (H) 11.4 - 15.2 seconds   INR 2.78   CBG monitoring, ED  Result Value Ref Range   Glucose-Capillary 40 (LL) 65 - 99 mg/dL      Daleen Bo, MD 02/25/16 1843

## 2016-02-25 NOTE — ED Notes (Signed)
OR called and advised room was ready for pt.

## 2016-02-25 NOTE — Transfer of Care (Signed)
Immediate Anesthesia Transfer of Care Note  Patient: Jack Irwin  Procedure(s) Performed: Procedure(s): IRRIGATION AND DEBRIDEMENT SCROTAL ABSCESS (N/A)  Patient Location: PACU  Anesthesia Type:General  Level of Consciousness: awake, alert  and oriented  Airway & Oxygen Therapy: Patient Spontanous Breathing and Patient connected to face mask oxygen  Post-op Assessment: Report given to RN and Post -op Vital signs reviewed and stable  Post vital signs: Reviewed and stable  Last Vitals:  Vitals:   02/25/16 1608 02/25/16 1827  BP: 116/81 112/72  Pulse: 94 61  Resp: 18 15  Temp: 36.9 C     Last Pain:  Vitals:   02/25/16 1838  TempSrc:   PainSc: 8          Complications: No apparent anesthesia complications

## 2016-02-25 NOTE — ED Notes (Signed)
Received report from [place], [place].

## 2016-02-25 NOTE — ED Notes (Signed)
Pt CBG=40.  Pt given orange juice, graham crackers and ginger ale.

## 2016-02-25 NOTE — Consult Note (Signed)
6:22 PM   Jack Irwin 12-05-1965 [id number]  Referring provider: Dr. Waynetta Pean  Chief Complaint  Patient presents with  . Abscess    HPI: The patient is a 51 year old gentleman with multiple medical comorbidities including end-stage renal disease on dialysis who presented to the hospital with swelling and pain of his right scrotum. This started 2 days ago. It is very tender to the touch. It has not drained anything. He denies fevers. A CT scan showed a large abscess in the right hemiscrotum with induration of the right inguinal canal. It showed no crepitus. His white blood cell count currently is 20. He has received Vanco and Zosyn. He has never had a scrotal abscess before though he has had 2 abscesses drained on his buttocks. He denies history of diabetes. He is due for dialysis tomorrow.   PMH: Past Medical History:  Diagnosis Date  . Anemia   . Arthritis   . BPH (benign prostatic hyperplasia)   . Chronic back pain   . Difficulty walking   . DVT (deep venous thrombosis) ([place])    Right popliteal DVT December 2017  . End-stage renal disease on hemodialysis ([place])    Dr. Lowanda Foster  . Erectile dysfunction   . Essential hypertension   . Gout   . Neuropathy, diabetic ([place])   . Type 2 diabetes mellitus ([place])   . Venous (peripheral) insufficiency     Surgical History: Past Surgical History:  Procedure Laterality Date  . AV FISTULA PLACEMENT Left 03/09/2014   Procedure: INSERTION OF ARTERIOVENOUS (AV) GORE-TEX GRAFT ARM;  Surgeon: Angelia Mould, MD;  Location: [facility];  Service: Vascular;  Laterality: Left;  . CYST EXCISION Right    cyst removed on thumb 2001  . INSERTION OF DIALYSIS CATHETER    . INSERTION OF DIALYSIS CATHETER Left 03/09/2014   Procedure: INSERTION OF DIALYSIS CATHETER;  Surgeon: Angelia Mould, MD;  Location: [facility];  Service: Vascular;  Laterality: Left;    Home Medications:    Allergies: No Known Allergies  Family History: Family  History  Problem Relation Age of Onset  . Diabetes Mother   . Hypertension Mother   . Heart failure Mother   . Hyperlipidemia Mother   . Heart attack Mother   . Cancer Father   . Diabetes Father   . Hypertension Father   . Hyperlipidemia Father     Social History:  reports that he has never smoked. He has never used smokeless tobacco. He reports that he does not drink alcohol or use drugs.  ROS: 12 point ROS negative except for above                                        Physical Exam: BP 116/81   Pulse 94   Temp 98.5 F (36.9 C)   Resp 18   Ht 5\' 11"  (1.803 m)   Wt 262 lb (118.8 kg)   SpO2 97%   BMI 36.54 kg/m   Constitutional:  Alert and oriented, No acute distress. HEENT: [place] AT, moist mucus membranes.  Trachea midline, no masses. Cardiovascular: No clubbing, cyanosis, or edema. Respiratory: Normal respiratory effort, no increased work of breathing. GI: Abdomen is soft, nontender, nondistended, no abdominal masses GU: No CVA tenderness. Normal phallus. The right hemiscrotum has an abscess that is at least 6-8 cm in width tracking up his inguinal canal. There is no  crepitus or signs of Fournier's gangrene. There is no drainage. Skin: No rashes, bruises or suspicious lesions. Lymph: No cervical or inguinal adenopathy. Neurologic: Grossly intact, no focal deficits, moving all 4 extremities. Psychiatric: Normal mood and affect.  Laboratory Data: Lab Results  Component Value Date   WBC 20.4 (H) 02/25/2016   HGB 10.8 (L) 02/25/2016   HCT 32.8 (L) 02/25/2016   MCV 89.6 02/25/2016   PLT 143 (L) 02/25/2016    Lab Results  Component Value Date   CREATININE 16.86 (H) 02/25/2016    Lab Results  Component Value Date   PSA 0.86 07/05/2015    No results found for: TESTOSTERONE  Lab Results  Component Value Date   HGBA1C 5.3 07/05/2015    Urinalysis    Component Value Date/Time   COLORURINE YELLOW 08/28/2014 1728   APPEARANCEUR CLEAR  08/28/2014 1728   LABSPEC 1.020 08/28/2014 1728   PHURINE 5.5 08/28/2014 1728   GLUCOSEU NEGATIVE 08/28/2014 1728   HGBUR SMALL (A) 08/28/2014 1728   BILIRUBINUR NEGATIVE 08/28/2014 1728   KETONESUR NEGATIVE 08/28/2014 1728   PROTEINUR TRACE (A) 08/28/2014 1728   UROBILINOGEN 0.2 08/28/2014 1728   NITRITE NEGATIVE 08/28/2014 1728   LEUKOCYTESUR TRACE (A) 08/28/2014 1728    Pertinent Imaging: CLINICAL DATA:  51 year old diabetic hypertensive male with abscess and testicles for 2 days. Initial encounter.  EXAM: CT PELVIS WITH CONTRAST  TECHNIQUE: Multidetector CT imaging of the pelvis was performed using the standard protocol following the bolus administration of intravenous contrast.  CONTRAST:  186mL ISOVUE-300 IOPAMIDOL (ISOVUE-300) INJECTION 61%  COMPARISON:  11/01/2015 CT.  FINDINGS: Urinary Tract: Contracted urinary bladder with circumferential thickening. Slight impression upon the bladder base by prostate gland.  Bowel: No extra luminal bowel inflammatory process. Left colon diverticula.  Vascular/Lymphatic: Calcification left common iliac artery.  Reproductive: Abnormal appearance of the scrotum with diffuse inflammation. Within the scrotum are ill-defined structures which may represent combination of abscess and testicle. No gas however this may represent Fournier's gangrene. Inflammatory process extends to the undersurface of the penis. Inflammation extends along the inferior aspect of the inguinal canal greater on the right. Adjacent reactive adenopathy.  Other:  None.  Musculoskeletal: Schmorl's node deformity inferior endplate L4 and L5.  IMPRESSION: Abnormal appearance of the scrotum with diffuse inflammation. Within the scrotum are ill-defined structures which may represent combination of abscess and testicle. No gas however this may represent Fournier's gangrene. Inflammatory process extends to the undersurface of the penis.  Inflammation extends along the inferior aspect of the inguinal canal greater on the right. Adjacent reactive adenopathy.  These results were called by telephone at the time of interpretation on 02/25/2016 at 3:10 pm to Dr. Stark Jock, who verbally acknowledged these results.  Assessment & Plan:    1. Scrotal abscess I discussed with the patient that his scrotal abscess needs emergent drainage. I feel it is too large to drain bedside. We discussed performing an incision and drainage of a scrotal abscess in the operating room. We discussed the risks, benefits, and indications of this procedure. He has elected to proceed. He is currently on Vanco and Zosyn. We will send cultures at the time of surgery. It has multiple medical comorbidities which include dialysis, he will be admitted to the hospitalist service postoperatively.   Nickie Retort, MD

## 2016-02-25 NOTE — Anesthesia Procedure Notes (Signed)
Procedure Name: Intubation Date/Time: 02/25/2016 8:26 PM Performed by: Noralyn Pick D Pre-anesthesia Checklist: Patient identified, Emergency Drugs available, Suction available and Patient being monitored Patient Re-evaluated:Patient Re-evaluated prior to inductionOxygen Delivery Method: Circle system utilized Preoxygenation: Pre-oxygenation with 100% oxygen Intubation Type: IV induction, Cricoid Pressure applied and Rapid sequence Laryngoscope Size: Mac and 4 Grade View: Grade II Tube type: Oral Tube size: 7.5 mm Number of attempts: 1 Airway Equipment and Method: Stylet Placement Confirmation: ETT inserted through vocal cords under direct vision,  positive ETCO2 and breath sounds checked- equal and bilateral Tube secured with: Tape Dental Injury: Teeth and Oropharynx as per pre-operative assessment

## 2016-02-25 NOTE — ED Notes (Signed)
Ilena RN report to [place], RN that patient needed to go to surgery. NT transported patient to OR. No report given.

## 2016-02-25 NOTE — Op Note (Signed)
Date of procedure: 02/25/16  Preoperative diagnosis:  1. Scrotal abscess   Postoperative diagnosis:  1. Scrotal abscess   Procedure: 1. Incision and drainage of scrotal abscess  Surgeon: Baruch Gouty, MD  Anesthesia: General  Complications: None  Intraoperative findings: The patient had a scrotal abscess on the right hemiscrotum as well as approximately 6 cm in size. All purulent areas were evacuated. There is no sign of crepitus or deformities screening. There is no skin necrosis.  EBL: 50 cc  Specimens: Anaerobic and aerobic wound culture  Drains: Iodoform gauze packing in scrotal abscess cavity  Disposition: Stable to the postanesthesia care unit  Indication for procedure: The patient is a 51 y.o. male with a large scrotal abscess and leukocytosis he presents today for emergent scrotal incision and drainage.  After reviewing the management options for treatment, the patient elected to proceed with the above surgical procedure(s). We have discussed the potential benefits and risks of the procedure, side effects of the proposed treatment, the likelihood of the patient achieving the goals of the procedure, and any potential problems that might occur during the procedure or recuperation. Informed consent has been obtained.  Description of procedure: The patient was met in the preoperative area. All risks, benefits, and indications of the procedure were described in great detail. The patient consented to the procedure. Preoperative antibiotics were given. The patient was taken to the operative theater. General anesthesia was induced per the anesthesia service. The patient was then placed in the dorsal lithotomy position and prepped and draped in the usual sterile fashion. A preoperative timeout was called.   A 5 cm incision was made in the lower right lateral hemiscrotum where the abscess cavity was. Anaerobic and aerobic cultures were sent after there is immediate drainage of purulent  discharge. Using both a hemostat and digital dissection all loculations were broken up and the right hemiscrotum. There was some edema in the direction of the right inguinal area. It did not track up past inguinal ring however. There is no crepitus. There is no skin necrosis. All loculations were broken up. The scrotal abscess cavity was then packed with iodoform gauze. The patient was then woken from anesthesia and transferred in stable condition post secured.  Plan: The patient will be admitted to the floor under the hospitalist service for IV antibiotics pending culture and sensitivity results. He will need to have his packing changed in his scrotal abscess daily.   Baruch Gouty, M.D.

## 2016-02-25 NOTE — Progress Notes (Signed)
Pharmacy Antibiotic Note  Jack Irwin is a 51 y.o. male admitted on 02/25/2016 with scrotal abscess.  Pharmacy has been consulted for vancomycin dosing. Patient also being continued on zosyn.  He is s/o surgical I&D in OR 1/22.  He has ESRD and HD dependent.  Last HD was 1/19 (missed HD 1/22).    Plan:  Vancomycin 2gm x 1 given at [facility],  Follow up HD schedule to determine timing of next dose  Zosyn 2.25gm IV q8h appropriate for HD  Check trough if remains on vancomycin   Follow culture result  Height: 5\' 11"  (180.3 cm) Weight: 262 lb (118.8 kg) IBW/kg (Calculated) : 75.3  Temp (24hrs), Avg:98.3 F (36.8 C), Min:98.2 F (36.8 C), Max:98.5 F (36.9 C)   Recent Labs Lab 02/25/16 1046  WBC 20.4*  CREATININE 16.86*    Estimated Creatinine Clearance: 7 mL/min (by C-G formula based on SCr of 16.86 mg/dL (H)).    No Known Allergies  Antimicrobials this admission:  1/22 vanco >> 1/22 zosyn >>  Dose adjustments this admission:   Microbiology results:  1/22 wound (OR) cx:  Thank you for allowing pharmacy to be a part of this patient's care.  Jack Irwin 02/25/2016 9:14 PM

## 2016-02-25 NOTE — Progress Notes (Signed)
PACU Note: [facility] Team given pts two pt belonging bags along with Security Inventory Sheet and lock box key

## 2016-02-25 NOTE — ED Triage Notes (Signed)
Pt c/o abscess to testicles x 2 days.

## 2016-02-25 NOTE — Anesthesia Preprocedure Evaluation (Addendum)
Anesthesia Evaluation  Patient identified by MRN, date of birth, ID band Patient awake    Reviewed: Allergy & Precautions, NPO status , Patient's Chart, lab work & pertinent test results  Airway Mallampati: II  TM Distance: >3 FB     Dental   Pulmonary neg pulmonary ROS,    breath sounds clear to auscultation       Cardiovascular hypertension, + Peripheral Vascular Disease  + dysrhythmias  Rhythm:Regular Rate:Normal     Neuro/Psych    GI/Hepatic Neg liver ROS, GI history noted. CG   Endo/Other  diabetes  Renal/GU Renal disease     Musculoskeletal  (+) Arthritis ,   Abdominal   Peds  Hematology  (+) anemia ,   Anesthesia Other Findings   Reproductive/Obstetrics                            Anesthesia Physical Anesthesia Plan  ASA: III  Anesthesia Plan: General   Post-op Pain Management:    Induction: Intravenous, Rapid sequence and Cricoid pressure planned  Airway Management Planned: Oral ETT  Additional Equipment:   Intra-op Plan:   Post-operative Plan:   Informed Consent: I have reviewed the patients History and Physical, chart, labs and discussed the procedure including the risks, benefits and alternatives for the proposed anesthesia with the patient or authorized representative who has indicated his/her understanding and acceptance.   Dental advisory given  Plan Discussed with: CRNA and Anesthesiologist  Anesthesia Plan Comments:         Anesthesia Quick Evaluation

## 2016-02-25 NOTE — ED Provider Notes (Signed)
[facility] DEPT Provider Note   CSN: [id number] Arrival date & time: 02/25/16  2542     History   Chief Complaint Chief Complaint  Patient presents with  . Abscess    HPI Jack Irwin is a 51 y.o. male.  Jack Irwin is a 51 y.o. Male with a history of ESRD on dialysis who presents to the ED complaining of an abscess to his right scrotum for the past two days. He reports pain and swelling to the area, but no discharge. He reports using warm towels with out relief.  He makes a small amount of urine. He is due for dialysis today and last dialysis Friday. He denies fevers, abdominal pain, nausea, vomiting or diarrhea.   The history is provided by the patient and medical records. No language interpreter was used.  Abscess  Associated symptoms: no fever, no headaches, no nausea and no vomiting     Past Medical History:  Diagnosis Date  . Anemia   . Arthritis   . BPH (benign prostatic hyperplasia)   . Chronic back pain   . Difficulty walking   . DVT (deep venous thrombosis) ([place])    Right popliteal DVT December 2017  . End-stage renal disease on hemodialysis ([place])    Dr. Lowanda Foster  . Erectile dysfunction   . Essential hypertension   . Gout   . Neuropathy, diabetic ([place])   . Type 2 diabetes mellitus ([place])   . Venous (peripheral) insufficiency     Patient Active Problem List   Diagnosis Date Noted  . Acute venous embolism and thrombosis of deep vessels of proximal lower extremity ([place]) [I82.4Y9] 02/11/2016  . Encounter for therapeutic drug monitoring 02/11/2016  . Nausea & vomiting 11/17/2014  . Uremia syndrome 11/17/2014  . Uremia 11/17/2014  . Thrombocytopenia ([place]) 02/21/2014  . Morbid obesity ([place]) 03/30/2013  . RBBB 03/29/2013  . Hyperkalemia 03/28/2013  . Leukocytosis 03/28/2013  . Nausea vomiting and diarrhea 03/28/2013  . BBB (bundle branch block) 03/28/2013  . Hypertension   . ESRD on dialysis ([place])   . Lack of coordination 04/15/2012  .  Muscle weakness (generalized) 04/15/2012  . Arthritis, gouty 04/15/2012  . Difficulty in walking(719.7) 08/18/2011  . Weakness of both legs 08/18/2011  . Poor balance 08/18/2011    Past Surgical History:  Procedure Laterality Date  . AV FISTULA PLACEMENT Left 03/09/2014   Procedure: INSERTION OF ARTERIOVENOUS (AV) GORE-TEX GRAFT ARM;  Surgeon: Angelia Mould, MD;  Location: [facility];  Service: Vascular;  Laterality: Left;  . CYST EXCISION Right    cyst removed on thumb 2001  . INSERTION OF DIALYSIS CATHETER    . INSERTION OF DIALYSIS CATHETER Left 03/09/2014   Procedure: INSERTION OF DIALYSIS CATHETER;  Surgeon: Angelia Mould, MD;  Location: [facility];  Service: Vascular;  Laterality: Left;       Home Medications    Prior to Admission medications   Medication Sig Start Date End Date Taking? Authorizing Provider  colchicine 0.6 MG tablet 1/2 tab po 2 times /week Patient taking differently: Take 0.3 mg by mouth daily as needed (for gout).  08/15/15  Yes Lily Kocher, PA-C  diclofenac sodium (VOLTAREN) 1 % GEL Apply to left wrist and hand three times daily 04/13/14  Yes Lily Kocher, PA-C  gabapentin (NEURONTIN) 100 MG capsule Take 100 mg by mouth daily.  07/11/14  Yes Historical Provider, MD  Multiple Vitamin (DAILY VITE PO) Take 1 tablet by mouth daily.   Yes Historical Provider,  MD  sildenafil (REVATIO) 20 MG tablet Take 20 mg by mouth as needed.   Yes Historical Provider, MD  ULORIC 80 MG TABS Take 80 mg by mouth daily. 09/26/14  Yes Historical Provider, MD  warfarin (COUMADIN) 2.5 MG tablet Take 2.5 mg by mouth daily. Take 1/2 tablet daily   Yes Historical Provider, MD  doxycycline (VIBRA-TABS) 100 MG tablet Take 1 tablet (100 mg total) by mouth 2 (two) times daily. Patient not taking: Reported on 02/25/2016 01/24/16   Francine Graven, DO    Family History Family History  Problem Relation Age of Onset  . Diabetes Mother   . Hypertension Mother   . Heart failure Mother     . Hyperlipidemia Mother   . Heart attack Mother   . Cancer Father   . Diabetes Father   . Hypertension Father   . Hyperlipidemia Father     Social History Social History  Substance Use Topics  . Smoking status: Never Smoker  . Smokeless tobacco: Never Used  . Alcohol use No     Allergies   Patient has no known allergies.   Review of Systems Review of Systems  Constitutional: Negative for chills and fever.  HENT: Negative for sore throat.   Eyes: Negative for visual disturbance.  Respiratory: Negative for cough and shortness of breath.   Cardiovascular: Negative for chest pain.  Gastrointestinal: Negative for abdominal pain, diarrhea, nausea and vomiting.  Genitourinary: Positive for scrotal swelling. Negative for discharge, penile pain and testicular pain.  Musculoskeletal: Negative for back pain.  Skin: Negative for rash.  Neurological: Negative for headaches.     Physical Exam Updated Vital Signs BP 116/81   Pulse 94   Temp 98.5 F (36.9 C)   Resp 18   Ht 5\' 11"  (1.803 m)   Wt 118.8 kg   SpO2 97%   BMI 36.54 kg/m   Physical Exam  Constitutional: He is oriented to person, place, and time. He appears well-developed and well-nourished. No distress.  Nontoxic appearing.  HENT:  Head: Normocephalic and atraumatic.  Eyes: Right eye exhibits no discharge. Left eye exhibits no discharge.  Cardiovascular: Normal rate, regular rhythm and intact distal pulses.   Pulmonary/Chest: Effort normal. No respiratory distress.  Abdominal: Soft. He exhibits no mass. There is no tenderness. There is no guarding.  Genitourinary: Penis normal.  Genitourinary Comments: Large right-sided scrotal mass that extends from the top of the scrotum into the bottom of the scrotum. No crepitus. No discharge. Mild tenderness to his right inguinal area as well. No penile tenderness. No penile discharge.  Neurological: He is alert and oriented to person, place, and time. Coordination normal.   Skin: Skin is warm and dry. Capillary refill takes less than 2 seconds. No rash noted. He is not diaphoretic. No pallor.  Psychiatric: He has a normal mood and affect. His behavior is normal.  Nursing note and vitals reviewed.    ED Treatments / Results  Labs (all labs ordered are listed, but only abnormal results are displayed) Labs Reviewed  BASIC METABOLIC PANEL - Abnormal; Notable for the following:       Result Value   Chloride 98 (*)    BUN 64 (*)    Creatinine, Ser 16.86 (*)    Calcium 8.8 (*)    GFR calc non Af Amer 3 (*)    GFR calc Af Amer 3 (*)    All other components within normal limits  CBC WITH DIFFERENTIAL/PLATELET - Abnormal; Notable for the  following:    WBC 20.4 (*)    RBC 3.66 (*)    Hemoglobin 10.8 (*)    HCT 32.8 (*)    RDW 16.4 (*)    Platelets 143 (*)    Neutro Abs 17.3 (*)    Monocytes Absolute 1.4 (*)    All other components within normal limits  PROTIME-INR - Abnormal; Notable for the following:    Prothrombin Time 29.9 (*)    All other components within normal limits    EKG  EKG Interpretation None       Radiology Ct Pelvis W Contrast  Result Date: 02/25/2016 CLINICAL DATA:  51 year old diabetic hypertensive male with abscess and testicles for 2 days. Initial encounter. EXAM: CT PELVIS WITH CONTRAST TECHNIQUE: Multidetector CT imaging of the pelvis was performed using the standard protocol following the bolus administration of intravenous contrast. CONTRAST:  182mL ISOVUE-300 IOPAMIDOL (ISOVUE-300) INJECTION 61% COMPARISON:  11/01/2015 CT. FINDINGS: Urinary Tract: Contracted urinary bladder with circumferential thickening. Slight impression upon the bladder base by prostate gland. Bowel: No extra luminal bowel inflammatory process. Left colon diverticula. Vascular/Lymphatic: Calcification left common iliac artery. Reproductive: Abnormal appearance of the scrotum with diffuse inflammation. Within the scrotum are ill-defined structures which may  represent combination of abscess and testicle. No gas however this may represent Fournier's gangrene. Inflammatory process extends to the undersurface of the penis. Inflammation extends along the inferior aspect of the inguinal canal greater on the right. Adjacent reactive adenopathy. Other:  None. Musculoskeletal: Schmorl's node deformity inferior endplate L4 and L5. IMPRESSION: Abnormal appearance of the scrotum with diffuse inflammation. Within the scrotum are ill-defined structures which may represent combination of abscess and testicle. No gas however this may represent Fournier's gangrene. Inflammatory process extends to the undersurface of the penis. Inflammation extends along the inferior aspect of the inguinal canal greater on the right. Adjacent reactive adenopathy. These results were called by telephone at the time of interpretation on 02/25/2016 at 3:10 pm to Dr. Stark Jock, who verbally acknowledged these results. Electronically Signed   By: Genia Del M.D.   On: 02/25/2016 15:14    Procedures Procedures (including critical care time)  Medications Ordered in ED Medications  morphine 4 MG/ML injection 4 mg (4 mg Intravenous Given 02/25/16 1101)  piperacillin-tazobactam (ZOSYN) 2.25 g in dextrose 5 % 50 mL IVPB (2.25 g Intravenous New Bag/Given 02/25/16 1102)  vancomycin (VANCOCIN) 2,000 mg in sodium chloride 0.9 % 500 mL IVPB (2,000 mg Intravenous New Bag/Given 02/25/16 1151)  iopamidol (ISOVUE-300) 61 % injection 100 mL (100 mLs Intravenous Contrast Given 02/25/16 1445)  morphine 4 MG/ML injection 4 mg (4 mg Intravenous Given 02/25/16 1604)     Initial Impression / Assessment and Plan / ED Course  I have reviewed the triage vital signs and the nursing notes.  Pertinent labs & imaging results that were available during my care of the patient were reviewed by me and considered in my medical decision making (see chart for details).   This is a 51 y.o. Male with a history of ESRD on dialysis who  presents to the ED complaining of an abscess to his right scrotum for the past two days. He reports pain and swelling to the area, but no discharge. On exam the patient is afebrile nontoxic appearing. He has a large scrotal abscess on the right without drainage. No crepitus. Will start the patient on vancomycin and Zosyn, check blood work and obtain CT pelvis with contrast.  CBC is remarkable for white count of  20,400. BMP shows a sodium of 135 and a potassium of 4.9. Creatinine is consistent with the patient in end-stage renal disease. INR is 2.78.  CT pelvis with contrast shows abnormal appearance of the scrotum with diffuse inflammation. No gas, however radiologist fears this may represent Fournier's gangrene. Inflammatory processes extends to the undersurface of the penis.  I consulted with urologist Dr. Pilar Jarvis who reviewed the images. He would like the patient transferred to [facility] ER so he can evaluate the patient and likely take to the OR. He would like medicine to admit, but does not want this to delay his transfer. If hospitalist service could be consulted on his arrival to [facility] please. Will transfer to [facility] by [employer] . I spoke with Dr. Lita Mains at [facility] who accepted the patient in transfer.   Please contact Dr. Pilar Jarvis on his arrival to [facility].   Patient agrees with plan for transfer.    This patient was discussed with and evaluated by Dr. Stark Jock who agrees with assessment and plan.   Final Clinical Impressions(s) / ED Diagnoses   Final diagnoses:  Abscess of scrotum    New Prescriptions New Prescriptions   No medications on file     Waynetta Pean, PA-C 02/25/16 [place], MD 02/28/16 0700

## 2016-02-25 NOTE — ED Notes (Signed)
Pt to CT at this time.

## 2016-02-25 NOTE — Progress Notes (Signed)
PACU nursing note: CBG ck'd upon pts arrival into PACU, resulted I 55mg /dl, Anesthesiologist aware, 1/2 amp of D50 administered IV push. Repeat CBG indicated "Low" reading, repeated the 1/2 amp of D50 IV, will reck CBG in 15 min per protocol

## 2016-02-25 NOTE — H&P (Signed)
History and Physical    Jack Irwin ION:[id number] DOB: 01-05-1966 DOA: 02/25/2016  PCP: Kingsley Callander, FNP   Patient coming from: Home.  Chief Complaint: Scrotal swelling and pain.  HPI: Jack Irwin is a 51 y.o. male with medical history significant of anemia, osteoarthritis, BPH, chronic back pain, DVT, ESRD on hemodialysis, erectile dysfunction, essential hypertension, gout, diabetic neuropathy, type 2 diabetes who is coming to the emergency department with complaints of pain and swelling on his right scrotal area. He denies fever, but feels chills and fatigue. He denies trauma to the area. He denies dysuria or hematuria.  ED Course: The patient was given analgesics, IV antibiotics and subsequently taken to the OR by urology for I&D of the area. His WBC 20.4, hemoglobin 10.8, weight was 143 and INR 2.79. His BUN was 64 creatinine 16.86. CABG in was 63 and the patient received an amp of D50. CT scan of the pelvis with contrast showed diffuse inflammation and abscess of the area.  Review of Systems: As per HPI otherwise 10 point review of systems negative.    Past Medical History:  Diagnosis Date  . Anemia   . Arthritis   . BPH (benign prostatic hyperplasia)   . Chronic back pain   . Difficulty walking   . DVT (deep venous thrombosis) ([place])    Right popliteal DVT December 2017  . End-stage renal disease on hemodialysis ([place])    Dr. Lowanda Foster  . Erectile dysfunction   . Essential hypertension   . Gout   . Neuropathy, diabetic ([place])   . Type 2 diabetes mellitus ([place])   . Venous (peripheral) insufficiency     Past Surgical History:  Procedure Laterality Date  . AV FISTULA PLACEMENT Left 03/09/2014   Procedure: INSERTION OF ARTERIOVENOUS (AV) GORE-TEX GRAFT ARM;  Surgeon: Angelia Mould, MD;  Location: [employer];  Service: Vascular;  Laterality: Left;  . CYST EXCISION Right    cyst removed on thumb 2001  . INSERTION OF DIALYSIS CATHETER    . INSERTION OF  DIALYSIS CATHETER Left 03/09/2014   Procedure: INSERTION OF DIALYSIS CATHETER;  Surgeon: Angelia Mould, MD;  Location: [facility];  Service: Vascular;  Laterality: Left;     reports that he has never smoked. He has never used smokeless tobacco. He reports that he does not drink alcohol or use drugs.  No Known Allergies  Family History  Problem Relation Age of Onset  . Diabetes Mother   . Hypertension Mother   . Heart failure Mother   . Hyperlipidemia Mother   . Heart attack Mother   . Cancer Father   . Diabetes Father   . Hypertension Father   . Hyperlipidemia Father     Prior to Admission medications   Medication Sig Start Date End Date Taking? Authorizing Provider  colchicine 0.6 MG tablet 1/2 tab po 2 times /week Patient taking differently: Take 0.3 mg by mouth daily as needed (for gout).  08/15/15  Yes Lily Kocher, PA-C  diclofenac sodium (VOLTAREN) 1 % GEL Apply to left wrist and hand three times daily 04/13/14  Yes Lily Kocher, PA-C  gabapentin (NEURONTIN) 100 MG capsule Take 100 mg by mouth daily.  07/11/14  Yes Historical Provider, MD  Multiple Vitamin (DAILY VITE PO) Take 1 tablet by mouth daily.   Yes Historical Provider, MD  sildenafil (REVATIO) 20 MG tablet Take 20 mg by mouth as needed.   Yes Historical Provider, MD  ULORIC 80 MG TABS Take  80 mg by mouth daily. 09/26/14  Yes Historical Provider, MD  warfarin (COUMADIN) 2.5 MG tablet Take 2.5 mg by mouth daily. Take 1/2 tablet daily   Yes Historical Provider, MD  doxycycline (VIBRA-TABS) 100 MG tablet Take 1 tablet (100 mg total) by mouth 2 (two) times daily. Patient not taking: Reported on 02/25/2016 01/24/16   Francine Graven, DO    Physical Exam:  Constitutional: NAD, calm, comfortable Vitals:   02/25/16 0822 02/25/16 1416 02/25/16 1608 02/25/16 1827  BP: 128/80 97/74 116/81 112/72  Pulse: 113 84 94 61  Resp: 20 18 18 15   Temp: 98.2 F (36.8 C) 98.2 F (36.8 C) 98.5 F (36.9 C)   TempSrc:  Oral    SpO2:  96% 99% 97% 95%  Weight: 118.8 kg (262 lb)     Height: 5\' 11"  (1.803 m)      Eyes: PERRL, lids and conjunctivae normal ENMT: Mucous membranes are moist. Posterior pharynx clear of any exudate or lesions. Neck: normal, supple, no masses, no thyromegaly Respiratory: clear to auscultation bilaterally, no wheezing, no crackles. Normal respiratory effort. No accessory muscle use.  Chest: Positive dialysis catheter in left upper chest area. Cardiovascular: Regular rate and rhythm, no murmurs / rubs / gallops. No extremity edema. 2+ pedal pulses. No carotid bruits.  Abdomen: soft, no tenderness, no masses palpated. No hepatosplenomegaly. Bowel sounds positive.  Genitalia: Significant edema and tenderness of right scrotal area. Musculoskeletal: no clubbing / cyanosis. No joint deformity upper and lower extremities. Good ROM, no contractures. Normal muscle tone.  Skin: Erythema and calor in the right scrotal area. Neurologic: CN 2-12 grossly intact. Sensation intact, DTR normal. Strength 5/5 in all 4.  Psychiatric: Alert and oriented x 3. Normal mood.    Labs on Admission: I have personally reviewed following labs and imaging studies  CBC:  Recent Labs Lab 02/25/16 1046  WBC 20.4*  NEUTROABS 17.3*  HGB 10.8*  HCT 32.8*  MCV 89.6  PLT 253*   Basic Metabolic Panel:  Recent Labs Lab 02/25/16 1046  NA 135  K 4.9  CL 98*  CO2 24  GLUCOSE 88  BUN 64*  CREATININE 16.86*  CALCIUM 8.8*   GFR: Estimated Creatinine Clearance: 7 mL/min (by C-G formula based on SCr of 16.86 mg/dL (H)). Liver Function Tests: No results for input(s): AST, ALT, ALKPHOS, BILITOT, PROT, ALBUMIN in the last 168 hours. No results for input(s): LIPASE, AMYLASE in the last 168 hours. No results for input(s): AMMONIA in the last 168 hours. Coagulation Profile:  Recent Labs Lab 02/25/16 1046  INR 2.78   Cardiac Enzymes: No results for input(s): CKTOTAL, CKMB, CKMBINDEX, TROPONINI in the last 168  hours. BNP (last 3 results) No results for input(s): PROBNP in the last 8760 hours. HbA1C: No results for input(s): HGBA1C in the last 72 hours. CBG:  Recent Labs Lab 02/25/16 1624 02/25/16 1828  GLUCAP 40* 75   Lipid Profile: No results for input(s): CHOL, HDL, LDLCALC, TRIG, CHOLHDL, LDLDIRECT in the last 72 hours. Thyroid Function Tests: No results for input(s): TSH, T4TOTAL, FREET4, T3FREE, THYROIDAB in the last 72 hours. Anemia Panel: No results for input(s): VITAMINB12, FOLATE, FERRITIN, TIBC, IRON, RETICCTPCT in the last 72 hours. Urine analysis:    Component Value Date/Time   COLORURINE YELLOW 08/28/2014 1728   APPEARANCEUR CLEAR 08/28/2014 1728   LABSPEC 1.020 08/28/2014 1728   PHURINE 5.5 08/28/2014 1728   GLUCOSEU NEGATIVE 08/28/2014 1728   HGBUR SMALL (A) 08/28/2014 1728   BILIRUBINUR NEGATIVE 08/28/2014  [place] 08/28/2014 1728   PROTEINUR TRACE (A) 08/28/2014 1728   UROBILINOGEN 0.2 08/28/2014 1728   NITRITE NEGATIVE 08/28/2014 1728   LEUKOCYTESUR TRACE (A) 08/28/2014 1728    Radiological Exams on Admission: Ct Pelvis W Contrast  Result Date: 02/25/2016 CLINICAL DATA:  51 year old diabetic hypertensive male with abscess and testicles for 2 days. Initial encounter. EXAM: CT PELVIS WITH CONTRAST TECHNIQUE: Multidetector CT imaging of the pelvis was performed using the standard protocol following the bolus administration of intravenous contrast. CONTRAST:  174mL ISOVUE-300 IOPAMIDOL (ISOVUE-300) INJECTION 61% COMPARISON:  11/01/2015 CT. FINDINGS: Urinary Tract: Contracted urinary bladder with circumferential thickening. Slight impression upon the bladder base by prostate gland. Bowel: No extra luminal bowel inflammatory process. Left colon diverticula. Vascular/Lymphatic: Calcification left common iliac artery. Reproductive: Abnormal appearance of the scrotum with diffuse inflammation. Within the scrotum are ill-defined structures which may represent  combination of abscess and testicle. No gas however this may represent Fournier's gangrene. Inflammatory process extends to the undersurface of the penis. Inflammation extends along the inferior aspect of the inguinal canal greater on the right. Adjacent reactive adenopathy. Other:  None. Musculoskeletal: Schmorl's node deformity inferior endplate L4 and L5. IMPRESSION: Abnormal appearance of the scrotum with diffuse inflammation. Within the scrotum are ill-defined structures which may represent combination of abscess and testicle. No gas however this may represent Fournier's gangrene. Inflammatory process extends to the undersurface of the penis. Inflammation extends along the inferior aspect of the inguinal canal greater on the right. Adjacent reactive adenopathy. These results were called by telephone at the time of interpretation on 02/25/2016 at 3:10 pm to Dr. Stark Jock, who verbally acknowledged these results. Electronically Signed   By: Genia Del M.D.   On: 02/25/2016 15:14    Assessment/Plan Principal Problem:   Scrotal abscess Admit to telemetry/observation. Continue analgesics as needed. Continue vancomycin per pharmacy Continue Zosyn per pharmacy I would like to thank urology for their input.  Active Problems:   Arthritis, gouty Continue colchicine and Uloric.    Hypertension Antihypertensives at this time. Monitor blood pressure closely.    ESRD on dialysis [facility]) Will be transferred to [facility] for hemodialysis in a.m.    Type 2 diabetes mellitus (HCC) Hold insulin for now due to hypoglycemia. The patient is currently nothing by mouth, so will start dextrose 10% at 50 mL an hour. CBG monitoring every 6 hours and as needed.   DVT prophylaxis:  Code Status: Full code. Family Communication:  Disposition Plan: Admit for incision and drainage of scrotal area and IV antibiotics. Consults called: Urology and nephrology. Admission status:  Observation/telemetry.   Reubin Milan MD [facility] Pager [phone]  If 7PM-7AM, please contact night-coverage [email or web address] Password [facility]  02/25/2016, 8:24 PM

## 2016-02-25 NOTE — Anesthesia Postprocedure Evaluation (Signed)
Anesthesia Post Note  Patient: Jack Irwin  Procedure(s) Performed: Procedure(s) (LRB): IRRIGATION AND DEBRIDEMENT SCROTAL ABSCESS (N/A)  Patient location during evaluation: PACU Anesthesia Type: General Level of consciousness: awake Pain management: pain level controlled Vital Signs Assessment: post-procedure vital signs reviewed and stable Respiratory status: spontaneous breathing Cardiovascular status: stable Anesthetic complications: no       Last Vitals:  Vitals:   02/25/16 2145 02/25/16 2215  BP:  (!) 140/2  Pulse: (!) 109   Resp: (!) 22   Temp:      Last Pain:  Vitals:   02/25/16 2215  TempSrc:   PainSc: Asleep                 Verlie Hellenbrand

## 2016-02-26 ENCOUNTER — Encounter (HOSPITAL_COMMUNITY): Payer: Self-pay | Admitting: Urology

## 2016-02-26 LAB — CBC WITH DIFFERENTIAL/PLATELET
BASOS ABS: 0 10*3/uL (ref 0.0–0.1)
Basophils Relative: 0 %
Eosinophils Absolute: 0 10*3/uL (ref 0.0–0.7)
Eosinophils Relative: 0 %
HEMATOCRIT: 31.9 % — AB (ref 39.0–52.0)
Hemoglobin: 10.3 g/dL — ABNORMAL LOW (ref 13.0–17.0)
LYMPHS PCT: 7 %
Lymphs Abs: 1.4 10*3/uL (ref 0.7–4.0)
MCH: 28.9 pg (ref 26.0–34.0)
MCHC: 32.3 g/dL (ref 30.0–36.0)
MCV: 89.4 fL (ref 78.0–100.0)
MONO ABS: 1 10*3/uL (ref 0.1–1.0)
Monocytes Relative: 5 %
NEUTROS ABS: 17.6 10*3/uL — AB (ref 1.7–7.7)
Neutrophils Relative %: 88 %
Platelets: 141 10*3/uL — ABNORMAL LOW (ref 150–400)
RBC: 3.57 MIL/uL — AB (ref 4.22–5.81)
RDW: 16.6 % — ABNORMAL HIGH (ref 11.5–15.5)
WBC: 20.1 10*3/uL — AB (ref 4.0–10.5)

## 2016-02-26 LAB — GLUCOSE, CAPILLARY
GLUCOSE-CAPILLARY: 83 mg/dL (ref 65–99)
GLUCOSE-CAPILLARY: 102 mg/dL — AB (ref 65–99)
GLUCOSE-CAPILLARY: 115 mg/dL — AB (ref 65–99)
Glucose-Capillary: 97 mg/dL (ref 65–99)
Glucose-Capillary: 65 mg/dL (ref 65–99)
Glucose-Capillary: 86 mg/dL (ref 65–99)

## 2016-02-26 LAB — BASIC METABOLIC PANEL
ANION GAP: 15 (ref 5–15)
BUN: 70 mg/dL — ABNORMAL HIGH (ref 6–20)
CALCIUM: 8.8 mg/dL — AB (ref 8.9–10.3)
CO2: 20 mmol/L — ABNORMAL LOW (ref 22–32)
CREATININE: 18.28 mg/dL — AB (ref 0.61–1.24)
Chloride: 98 mmol/L — ABNORMAL LOW (ref 101–111)
GFR calc Af Amer: 3 mL/min — ABNORMAL LOW (ref >60)
GFR calc non Af Amer: 3 mL/min — ABNORMAL LOW (ref >60)
GLUCOSE: 91 mg/dL (ref 65–99)
Potassium: 6.3 mmol/L (ref 3.5–5.1)
Sodium: 133 mmol/L — ABNORMAL LOW (ref 135–145)

## 2016-02-26 LAB — COMPREHENSIVE METABOLIC PANEL
ALK PHOS: 62 U/L (ref 38–126)
ALT: 19 U/L (ref 17–63)
AST: 22 U/L (ref 15–41)
Albumin: 3 g/dL — ABNORMAL LOW (ref 3.5–5.0)
Anion gap: 11 (ref 5–15)
BUN: 68 mg/dL — AB (ref 6–20)
CO2: 24 mmol/L (ref 22–32)
CREATININE: 18.2 mg/dL — AB (ref 0.61–1.24)
Calcium: 8.8 mg/dL — ABNORMAL LOW (ref 8.9–10.3)
Chloride: 100 mmol/L — ABNORMAL LOW (ref 101–111)
GFR calc Af Amer: 3 mL/min — ABNORMAL LOW (ref >60)
GFR, EST NON AFRICAN AMERICAN: 3 mL/min — AB (ref >60)
Glucose, Bld: 99 mg/dL (ref 65–99)
Potassium: 6.5 mmol/L (ref 3.5–5.1)
Sodium: 135 mmol/L (ref 135–145)
TOTAL PROTEIN: 7.4 g/dL (ref 6.5–8.1)
Total Bilirubin: 0.8 mg/dL (ref 0.3–1.2)

## 2016-02-26 LAB — MRSA PCR SCREENING: MRSA by PCR: NEGATIVE

## 2016-02-26 LAB — HEPATITIS B SURFACE ANTIGEN: HEP B S AG: NEGATIVE

## 2016-02-26 LAB — PROTIME-INR
INR: 3.2
PROTHROMBIN TIME: 33.4 s — AB (ref 11.4–15.2)

## 2016-02-26 MED ORDER — ORAL CARE MOUTH RINSE
15.0000 mL | Freq: Two times a day (BID) | OROMUCOSAL | Status: DC
  Administered 2016-02-26 – 2016-02-27 (×3): 15 mL via OROMUCOSAL

## 2016-02-26 MED ORDER — SODIUM CHLORIDE 0.9 % IV SOLN
125.0000 mg | Freq: Once | INTRAVENOUS | Status: AC
  Administered 2016-02-26: 125 mg via INTRAVENOUS
  Filled 2016-02-26: qty 10

## 2016-02-26 MED ORDER — DOXERCALCIFEROL 4 MCG/2ML IV SOLN
INTRAVENOUS | Status: AC
  Administered 2016-02-26: 1.5 ug via INTRAVENOUS
  Filled 2016-02-26: qty 2

## 2016-02-26 MED ORDER — VANCOMYCIN HCL IN DEXTROSE 1-5 GM/200ML-% IV SOLN
INTRAVENOUS | Status: AC
  Filled 2016-02-26: qty 200

## 2016-02-26 MED ORDER — SODIUM CHLORIDE 0.9 % IV SOLN
1.0000 g | Freq: Once | INTRAVENOUS | Status: AC
  Administered 2016-02-26: 1 g via INTRAVENOUS
  Filled 2016-02-26: qty 10

## 2016-02-26 MED ORDER — DOXERCALCIFEROL 4 MCG/2ML IV SOLN
1.5000 ug | Freq: Once | INTRAVENOUS | Status: AC
  Administered 2016-02-26: 1.5 ug via INTRAVENOUS

## 2016-02-26 MED ORDER — VANCOMYCIN HCL IN DEXTROSE 1-5 GM/200ML-% IV SOLN
1000.0000 mg | INTRAVENOUS | Status: DC
  Administered 2016-02-27: 1000 mg via INTRAVENOUS
  Filled 2016-02-26: qty 200

## 2016-02-26 MED ORDER — WARFARIN - PHARMACIST DOSING INPATIENT
Freq: Every day | Status: DC
  Administered 2016-02-27 – 2016-02-28 (×2)

## 2016-02-26 MED ORDER — VANCOMYCIN HCL IN DEXTROSE 1-5 GM/200ML-% IV SOLN
1000.0000 mg | INTRAVENOUS | Status: AC
  Administered 2016-02-26: 1000 mg via INTRAVENOUS

## 2016-02-26 MED ORDER — PIPERACILLIN-TAZOBACTAM 3.375 G IVPB
3.3750 g | Freq: Two times a day (BID) | INTRAVENOUS | Status: DC
  Administered 2016-02-26 – 2016-02-28 (×4): 3.375 g via INTRAVENOUS
  Filled 2016-02-26 (×5): qty 50

## 2016-02-26 NOTE — Progress Notes (Addendum)
Noticed that patient has some of the home medications with him. Educated patient that medications should be counted and send to pharmacy, and that medications will be returned prior discharge. Pt refused, stated that he will send medication tomorrow home. Will continue to monitor.   Gwendolyne Welford, RN

## 2016-02-26 NOTE — Progress Notes (Signed)
PROGRESS NOTE    Jack Irwin  ZOX:[id number] DOB: 1965-09-15 DOA: 02/25/2016 PCP: Kingsley Callander, FNP    Brief Narrative:  Jack Irwin is a 51 y.o. male with medical history significant of anemia, osteoarthritis, BPH, chronic back pain, DVT, ESRD on hemodialysis, erectile dysfunction, essential hypertension, gout, diabetic neuropathy, type 2 diabetes who is coming to the emergency department with complaints of pain and swelling on his right scrotal area.  CT scan of the pelvis with contrast showed diffuse inflammation and abscess of the area.  The patient was given analgesics, IV antibiotics and subsequently taken to the OR by urology for I&D of the area. His WBC 20.4, hemoglobin 10.8, weight was 143 and INR 2.79. His BUN was 64 creatinine 16.86.  Assessment & Plan:   Principal Problem:   Scrotal abscess Active Problems:   Arthritis, gouty   Hypertension   ESRD on dialysis ([place])   Type 2 diabetes mellitus ([place])   1-Scrotal abscess;  Underwent I and D by Dr Darnelle Going 1-22. Continue with IV antibiotics.  Daily dressing changes of the wound.    2-ESRD hemodialysis, ([facility], Dr Lieutenant Diego) Potasium at 6.5  Nephrology consulted.  Calcium gluconate ordered. Stat EKG reviewed.   3-DM type II;  Hold insulin , patient with episodes of hypoglycemia.   4-HTN; Arthritis Gouty;  Continue with colchicine and Uloric.   5-DVT, history; resume coumadin if ok by urology ,  pharmacy to dose.   DVT prophylaxis: on coumadin.  Code Status: full code.  Family Communication: care discussed with patient  Disposition Plan: remain inpatient for HD and IV antibiotics.   Consultants:   Urology  nephrology   Procedures:   I and D of scrotal abscess 1-22   Antimicrobials:  Vancomycin and zosyn 1-22.   Subjective: He is complaining of pain, scrotum.  Just ate breakfast , no other complaints.   Objective: Vitals:   02/25/16 2236 02/25/16 2245 02/25/16 2357 02/26/16  0509  BP:  (!) 137/92 132/85 119/84  Pulse:  (!) 105 96 87  Resp:  (!) 24 20 20   Temp:  98 F (36.7 C) 98 F (36.7 C) 98 F (36.7 C)  TempSrc:   Oral Oral  SpO2: 94% 96% 100% 100%  Weight:   122.9 kg (270 lb 14.4 oz) 124 kg (273 lb 6.4 oz)  Height:   5\' 11"  (1.803 m)     Intake/Output Summary (Last 24 hours) at 02/26/16 0718 Last data filed at 02/26/16 0656  Gross per 24 hour  Intake          1110.41 ml  Output                0 ml  Net          1110.41 ml   Filed Weights   02/25/16 0822 02/25/16 2357 02/26/16 0509  Weight: 118.8 kg (262 lb) 122.9 kg (270 lb 14.4 oz) 124 kg (273 lb 6.4 oz)    Examination:  General exam: Appears calm and comfortable  Respiratory system: Clear to auscultation. Respiratory effort normal. Cardiovascular system: S1 & S2 heard, RRR. No JVD, murmurs, rubs, gallops or clicks. No pedal edema. Gastrointestinal system: Abdomen is nondistended, soft and nontender. No organomegaly or masses felt. Normal bowel sounds heard. Central nervous system: Alert and oriented. No focal neurological deficits. Extremities: Symmetric 5 x 5 power. Skin: No rashes, lesions or ulcers Genital; didn't allow me to examined him.     Data Reviewed: I have  personally reviewed following labs and imaging studies  CBC:  Recent Labs Lab 02/25/16 1046 02/26/16 0449  WBC 20.4* 20.1*  NEUTROABS 17.3* 17.6*  HGB 10.8* 10.3*  HCT 32.8* 31.9*  MCV 89.6 89.4  PLT 143* 536*   Basic Metabolic Panel:  Recent Labs Lab 02/25/16 1046 02/26/16 0449  NA 135 135  K 4.9 6.5*  CL 98* 100*  CO2 24 24  GLUCOSE 88 99  BUN 64* 68*  CREATININE 16.86* 18.20*  CALCIUM 8.8* 8.8*   GFR: Estimated Creatinine Clearance: 6.7 mL/min (by C-G formula based on SCr of 18.2 mg/dL (H)). Liver Function Tests:  Recent Labs Lab 02/26/16 0449  AST 22  ALT 19  ALKPHOS 62  BILITOT 0.8  PROT 7.4  ALBUMIN 3.0*   No results for input(s): LIPASE, AMYLASE in the last 168 hours. No  results for input(s): AMMONIA in the last 168 hours. Coagulation Profile:  Recent Labs Lab 02/25/16 1046 02/26/16 0449  INR 2.78 3.20   Cardiac Enzymes: No results for input(s): CKTOTAL, CKMB, CKMBINDEX, TROPONINI in the last 168 hours. BNP (last 3 results) No results for input(s): PROBNP in the last 8760 hours. HbA1C: No results for input(s): HGBA1C in the last 72 hours. CBG:  Recent Labs Lab 02/25/16 2207 02/25/16 2232 02/25/16 2353 02/26/16 0648 02/26/16 0710  GLUCAP <10* 83 128* 65 83   Lipid Profile: No results for input(s): CHOL, HDL, LDLCALC, TRIG, CHOLHDL, LDLDIRECT in the last 72 hours. Thyroid Function Tests: No results for input(s): TSH, T4TOTAL, FREET4, T3FREE, THYROIDAB in the last 72 hours. Anemia Panel: No results for input(s): VITAMINB12, FOLATE, FERRITIN, TIBC, IRON, RETICCTPCT in the last 72 hours. Sepsis Labs: No results for input(s): PROCALCITON, LATICACIDVEN in the last 168 hours.  Recent Results (from the past 240 hour(s))  MRSA PCR Screening     Status: None   Collection Time: 02/25/16 11:47 PM  Result Value Ref Range Status   MRSA by PCR NEGATIVE NEGATIVE Final    Comment:        The GeneXpert MRSA Assay (FDA approved for NASAL specimens only), is one component of a comprehensive MRSA colonization surveillance program. It is not intended to diagnose MRSA infection nor to guide or monitor treatment for MRSA infections.          Radiology Studies: Ct Pelvis W Contrast  Result Date: 02/25/2016 CLINICAL DATA:  51 year old diabetic hypertensive male with abscess and testicles for 2 days. Initial encounter. EXAM: CT PELVIS WITH CONTRAST TECHNIQUE: Multidetector CT imaging of the pelvis was performed using the standard protocol following the bolus administration of intravenous contrast. CONTRAST:  178mL ISOVUE-300 IOPAMIDOL (ISOVUE-300) INJECTION 61% COMPARISON:  11/01/2015 CT. FINDINGS: Urinary Tract: Contracted urinary bladder with  circumferential thickening. Slight impression upon the bladder base by prostate gland. Bowel: No extra luminal bowel inflammatory process. Left colon diverticula. Vascular/Lymphatic: Calcification left common iliac artery. Reproductive: Abnormal appearance of the scrotum with diffuse inflammation. Within the scrotum are ill-defined structures which may represent combination of abscess and testicle. No gas however this may represent Fournier's gangrene. Inflammatory process extends to the undersurface of the penis. Inflammation extends along the inferior aspect of the inguinal canal greater on the right. Adjacent reactive adenopathy. Other:  None. Musculoskeletal: Schmorl's node deformity inferior endplate L4 and L5. IMPRESSION: Abnormal appearance of the scrotum with diffuse inflammation. Within the scrotum are ill-defined structures which may represent combination of abscess and testicle. No gas however this may represent Fournier's gangrene. Inflammatory process extends to the undersurface of  the penis. Inflammation extends along the inferior aspect of the inguinal canal greater on the right. Adjacent reactive adenopathy. These results were called by telephone at the time of interpretation on 02/25/2016 at 3:10 pm to Dr. Stark Jock, who verbally acknowledged these results. Electronically Signed   By: Genia Del M.D.   On: 02/25/2016 15:14        Scheduled Meds: . dextrose      . febuxostat  80 mg Oral Daily  . fentaNYL      . gabapentin  100 mg Oral Daily  . mouth rinse  15 mL Mouth Rinse BID  .  morphine injection  4 mg Intravenous Once  . piperacillin-tazobactam (ZOSYN)  IV  2.25 g Intravenous Q8H   Continuous Infusions: . dextrose 25 mL/hr at 02/26/16 0055     LOS: 1 day    Time spent: 35 minutes.     Elmarie Shiley, MD [facility] Pager [phone]  If 7PM-7AM, please contact night-coverage [email or web address] Password [facility] 02/26/2016, 7:18 AM

## 2016-02-26 NOTE — Progress Notes (Signed)
CRITICAL VALUE ALERT  Critical value received:  Potassium  Date of notification:  02/26/2016  Time of notification:  6:05  Critical value read back:Yes.    Nurse who received alert:  Amor, RN  MD notified (1st page):  Schorr K  Time of first page:  6:08  MD notified (2nd page):Schorr K  Time of second page:6:35  Responding MD:  Schorr K  Time MD responded: 6:40  No new orders at this time. Will inform day shift.  Saladin Petrelli, RN

## 2016-02-26 NOTE — Progress Notes (Signed)
Hypoglycemic Event  CBG: 65  Treatment: 15 GM carbohydrate snack  Symptoms: None  Follow-up CBG: Time:15 CBG Result:83  Possible Reasons for Event: Unknown  Comments/MD notified:per protocol     Jack Irwin

## 2016-02-26 NOTE — Progress Notes (Signed)
[facility] brief note (full note to follow)  [facility] pt of Dr. Hinda Lenis transferred here for drainage of scrotal abscess (done last night) PMH ESRD on HD, OA, BPH, DVT, DM, HT, gout, peripheral neuropathy  Usually MWF HD but did not attend treatment yesterday  HD Rx:  MWF 4.5 hours 400/600 2K 2.5 Ca EDW 119.5 (usually gets to) 1.g hectorol TIW Venofer 50/week [facility] - [place] for HD today  Will get back on schedule prior to discharge  Jamal Maes, MD [place] Pager 02/26/2016, 7:55 AM

## 2016-02-26 NOTE — Progress Notes (Signed)
ANTICOAGULATION CONSULT NOTE - Initial Consult  Pharmacy Consult for Warfarin Indication: h/o DVT  No Known Allergies  Patient Measurements: Height: 5\' 11"  (180.3 cm) Weight: 273 lb 6.4 oz (124 kg) IBW/kg (Calculated) : 75.3  Vital Signs: Temp: 98 F (36.7 C) (01/23 0509) Temp Source: Oral (01/23 0509) BP: 119/84 (01/23 0509) Pulse Rate: 87 (01/23 0509)  Labs:  Recent Labs  02/25/16 1046 02/26/16 0449  HGB 10.8* 10.3*  HCT 32.8* 31.9*  PLT 143* 141*  LABPROT 29.9* 33.4*  INR 2.78 3.20  CREATININE 16.86* 18.20*    Estimated Creatinine Clearance: 6.7 mL/min (by C-G formula based on SCr of 18.2 mg/dL (H)).   Medical History: Past Medical History:  Diagnosis Date  . Anemia   . Arthritis   . BPH (benign prostatic hyperplasia)   . Chronic back pain   . Difficulty walking   . DVT (deep venous thrombosis) ([place])    Right popliteal DVT December 2017  . End-stage renal disease on hemodialysis ([place])    Dr. Lowanda Foster  . Erectile dysfunction   . Essential hypertension   . Gout   . Neuropathy, diabetic ([place])   . Type 2 diabetes mellitus ([place])   . Venous (peripheral) insufficiency     Medications:  Prescriptions Prior to Admission  Medication Sig Dispense Refill Last Dose  . colchicine 0.6 MG tablet 1/2 tab po 2 times /week (Patient taking differently: Take 0.3 mg by mouth daily as needed (for gout). ) 15 tablet 0 02/24/2016 at Unknown time  . diclofenac sodium (VOLTAREN) 1 % GEL Apply to left wrist and hand three times daily 100 g 0 02/24/2016 at Unknown time  . gabapentin (NEURONTIN) 100 MG capsule Take 100 mg by mouth daily.    02/24/2016 at Unknown time  . Multiple Vitamin (DAILY VITE PO) Take 1 tablet by mouth daily.   02/24/2016 at Unknown time  . sildenafil (REVATIO) 20 MG tablet Take 20 mg by mouth as needed.   unknown  . ULORIC 80 MG TABS Take 80 mg by mouth daily.   02/24/2016 at Unknown time  . warfarin (COUMADIN) 2.5 MG tablet Take 2.5 mg by mouth daily. Take  1/2 tablet daily   02/24/2016 at 0700  . doxycycline (VIBRA-TABS) 100 MG tablet Take 1 tablet (100 mg total) by mouth 2 (two) times daily. (Patient not taking: Reported on 02/25/2016) 20 tablet 0 Not Taking at Unknown time   Scheduled:  . dextrose      . febuxostat  80 mg Oral Daily  . fentaNYL      . gabapentin  100 mg Oral Daily  . mouth rinse  15 mL Mouth Rinse BID  .  morphine injection  4 mg Intravenous Once  . piperacillin-tazobactam (ZOSYN)  IV  2.25 g Intravenous Q8H   Infusions:  . dextrose 25 mL/hr at 02/26/16 0055    Assessment: 51yo male with history of DVT on warfarin PTA presents with scrotal pain and swelling. Pharmacy is consulted to dose warfarin for history of DVT. Patient was taken to OR by urology for I&D 02/25/16. INR is currently supratherapeutic at 3.2 with last dose taken 1/21.  PTA Warfarin Dose: 2.5mg /d  Goal of Therapy:  INR 2-3 Monitor platelets by anticoagulation protocol: Yes   Plan:  Hold warfarin tonight Daily INR Monitor s/sx of bleeding  Andrey Cota. Diona Foley, PharmD, BCPS Clinical Pharmacist Pager [phone] 02/26/2016,7:50 AM

## 2016-02-26 NOTE — Progress Notes (Signed)
Critical lab result called from lab: Potassium = 6.3.  Patient's RN, Shirlee Limerick given lab result.

## 2016-02-26 NOTE — Procedures (Signed)
I personally attended this patient's dialysis session.   Pre weight was 123.9 (EDW 119.5) Only allowed 3 kg goal Used 1K bath for 30 min, then 2K for K 6.3  Jamal Maes, MD [place] Pager 02/26/2016, 6:13 PM

## 2016-02-26 NOTE — Progress Notes (Signed)
Moved to [facility] for HD overnight Pain improved Packing changed this AM WBC still elevated  Vitals:   02/25/16 2236 02/25/16 2245 02/25/16 2357 02/26/16 0509  BP:  (!) 137/92 132/85 119/84  Pulse:  (!) 105 96 87  Resp:  (!) 24 20 20   Temp:  98 F (36.7 C) 98 F (36.7 C) 98 F (36.7 C)  TempSrc:   Oral Oral  SpO2: 94% 96% 100% 100%  Weight:   270 lb 14.4 oz (122.9 kg) 273 lb 6.4 oz (124 kg)  Height:   5\' 11"  (1.803 m)    I/O last 3 completed shifts: In: 1110.4 [P.O.:240; I.V.:750.4; Other:20; IV Piggyback:100] Out: 0  No intake/output data recorded.   NAD Soft NT ND Scrotum with packing in place. No purulent drainage, crepitus, or necrosis  CBC    Component Value Date/Time   WBC 20.1 (H) 02/26/2016 0449   RBC 3.57 (L) 02/26/2016 0449   HGB 10.3 (L) 02/26/2016 0449   HCT 31.9 (L) 02/26/2016 0449   PLT 141 (L) 02/26/2016 0449   MCV 89.4 02/26/2016 0449   MCH 28.9 02/26/2016 0449   MCHC 32.3 02/26/2016 0449   RDW 16.6 (H) 02/26/2016 0449   LYMPHSABS 1.4 02/26/2016 0449   MONOABS 1.0 02/26/2016 0449   EOSABS 0.0 02/26/2016 0449   BASOSABS 0.0 02/26/2016 0449   BMP Latest Ref Rng & Units 02/26/2016 02/25/2016 02/14/2016  Glucose 65 - 99 mg/dL 99 88 96  BUN 6 - 20 mg/dL 68(H) 64(H) 47(H)  Creatinine 0.61 - 1.24 mg/dL 18.20(H) 16.86(H) 11.73(H)  Sodium 135 - 145 mmol/L 135 135 133(L)  Potassium 3.5 - 5.1 mmol/L 6.5(HH) 4.9 4.9  Chloride 101 - 111 mmol/L 100(L) 98(L) 96(L)  CO2 22 - 32 mmol/L 24 24 25   Calcium 8.9 - 10.3 mg/dL 8.8(L) 8.8(L) 9.3    POD 1 s/p I and D scrotal abscess -continue abx pending c/s results -daily packing changes by nursing staff. Will need to continue as an outpatient until cavity closes -no further urologic intervention indicated. Urology will sign off. -f/u in one month for wound check as outpatient

## 2016-02-26 NOTE — Consult Note (Addendum)
[facility] Consultation Note Requesting Physician:  [place]al[place] Primary Nephrologist: Befakadu Reason for Consult:  Provision of HD and ESRD related services  HPI: The patient is a 51 y.o. year-old AAM with DM, HTN, ESRD on MWF HD ([facility]), H/O DVT.  Went to ED with 2 days of scrotal pain, found to have scrotal abscess by CT, had surgery at [facility] by Urology and was transferred here so could receive HD. Getting ATB's for the abscess. Missed HD yesterday - K today >6. Biggest c/o today scrotal pain and swelling.  Past Medical History:  Diagnosis Date  . Anemia   . Arthritis   . BPH (benign prostatic hyperplasia)   . Chronic back pain   . Difficulty walking   . DVT (deep venous thrombosis) ([place])    Right popliteal DVT December 2017  . End-stage renal disease on hemodialysis ([place])    Dr. Lowanda Foster  . Erectile dysfunction   . Essential hypertension   . Gout   . Neuropathy, diabetic ([place])   . Type 2 diabetes mellitus ([place])   . Venous (peripheral) insufficiency     Past Surgical History:  Procedure Laterality Date  . AV FISTULA PLACEMENT Left 03/09/2014   Procedure: INSERTION OF ARTERIOVENOUS (AV) GORE-TEX GRAFT ARM;  Surgeon: Angelia Mould, MD;  Location: [place];  Service: Vascular;  Laterality: Left;  . CYST EXCISION Right    cyst removed on thumb 2001  . INSERTION OF DIALYSIS CATHETER    . INSERTION OF DIALYSIS CATHETER Left 03/09/2014   Procedure: INSERTION OF DIALYSIS CATHETER;  Surgeon: Angelia Mould, MD;  Location: [place];  Service: Vascular;  Laterality: Left;  . IRRIGATION AND DEBRIDEMENT ABSCESS N/A 02/25/2016   Procedure: IRRIGATION AND DEBRIDEMENT SCROTAL ABSCESS;  Surgeon: Nickie Retort, MD;  Location: [facility];  Service: Urology;  Laterality: N/A;    Family History  Problem Relation Age of Onset  . Diabetes Mother   . Hypertension Mother   . Heart failure Mother   . Hyperlipidemia Mother   . Heart attack Mother   . Cancer Father   . Diabetes Father   .  Hypertension Father   . Hyperlipidemia Father    Social History:  reports that he has never smoked. He has never used smokeless tobacco. He reports that he does not drink alcohol or use drugs.  Allergies: No Known Allergies  Home medications: Prior to Admission medications   Medication Sig Start Date End Date Taking? Authorizing Provider  colchicine 0.6 MG tablet 1/2 tab po 2 times /week Patient taking differently: Take 0.3 mg by mouth daily as needed (for gout).  08/15/15  Yes Lily Kocher, PA-C  diclofenac sodium (VOLTAREN) 1 % GEL Apply to left wrist and hand three times daily 04/13/14  Yes Lily Kocher, PA-C  gabapentin (NEURONTIN) 100 MG capsule Take 100 mg by mouth daily.  07/11/14  Yes Historical Provider, MD  Multiple Vitamin (DAILY VITE PO) Take 1 tablet by mouth daily.   Yes Historical Provider, MD  sildenafil (REVATIO) 20 MG tablet Take 20 mg by mouth as needed.   Yes Historical Provider, MD  ULORIC 80 MG TABS Take 80 mg by mouth daily. 09/26/14  Yes Historical Provider, MD  warfarin (COUMADIN) 2.5 MG tablet Take 2.5 mg by mouth daily. Take 1/2 tablet daily   Yes Historical Provider, MD  doxycycline (VIBRA-TABS) 100 MG tablet Take 1 tablet (100 mg total) by mouth 2 (two) times daily. Patient not taking: Reported on 02/25/2016 01/24/16   Nunzio Cory  Thurnell Garbe, DO    Inpatient medications: . febuxostat  80 mg Oral Daily  . gabapentin  100 mg Oral Daily  . mouth rinse  15 mL Mouth Rinse BID  .  morphine injection  4 mg Intravenous Once  . piperacillin-tazobactam (ZOSYN)  IV  3.375 g Intravenous Q12H  . [START ON 02/27/2016] vancomycin  1,000 mg Intravenous Q M,W,F-HD  . [START ON 02/27/2016] Warfarin - Pharmacist Dosing Inpatient   Does not apply q1800    Review of Systems See HPI  Physical Exam:  Blood pressure (!) 134/99, pulse 96, temperature 98.8 F (37.1 C), temperature source Oral, resp. rate 18, height 5\' 11"  (1.803 m), weight 120.9 kg (266 lb 8.6 oz), SpO2 97 %.  Gen:  Middle aged AAM, seen in the dialysis unit TDC running at 400 Ant lung fields clear S1S2 No S3 no rub Soft NT abdomen Dressings in place scrotal area  Labs:  Recent Labs Lab 02/25/16 1046 02/26/16 0449 02/26/16 0845  NA 135 135 133*  K 4.9 6.5* 6.3*  CL 98* 100* 98*  CO2 24 24 20*  GLUCOSE 88 99 91  BUN 64* 68* 70*  CREATININE 16.86* 18.20* 18.28*  CALCIUM 8.8* 8.8* 8.8*    Recent Labs Lab 02/26/16 0449  AST 22  ALT 19  ALKPHOS 62  BILITOT 0.8  PROT 7.4  ALBUMIN 3.0*    Recent Labs Lab 02/25/16 1046 02/26/16 0449  WBC 20.4* 20.1*  NEUTROABS 17.3* 17.6*  HGB 10.8* 10.3*  HCT 32.8* 31.9*  MCV 89.6 89.4  PLT 143* 141*    Recent Labs Lab 02/25/16 2353 02/26/16 0648 02/26/16 0710 02/26/16 1102 02/26/16 1753  GLUCAP 128* 65 83 86 102*   Xrays/Other Studies: Ct Pelvis W Contrast  Result Date: 02/25/2016 CLINICAL DATA:  51 year old diabetic hypertensive male with abscess and testicles for 2 days. Initial encounter. EXAM: CT PELVIS WITH CONTRAST TECHNIQUE: Multidetector CT imaging of the pelvis was performed using the standard protocol following the bolus administration of intravenous contrast. CONTRAST:  122mL ISOVUE-300 IOPAMIDOL (ISOVUE-300) INJECTION 61% COMPARISON:  11/01/2015 CT. FINDINGS: Urinary Tract: Contracted urinary bladder with circumferential thickening. Slight impression upon the bladder base by prostate gland. Bowel: No extra luminal bowel inflammatory process. Left colon diverticula. Vascular/Lymphatic: Calcification left common iliac artery. Reproductive: Abnormal appearance of the scrotum with diffuse inflammation. Within the scrotum are ill-defined structures which may represent combination of abscess and testicle. No gas however this may represent Fournier's gangrene. Inflammatory process extends to the undersurface of the penis. Inflammation extends along the inferior aspect of the inguinal canal greater on the right. Adjacent reactive  adenopathy. Other:  None. Musculoskeletal: Schmorl's node deformity inferior endplate L4 and L5. IMPRESSION: Abnormal appearance of the scrotum with diffuse inflammation. Within the scrotum are ill-defined structures which may represent combination of abscess and testicle. No gas however this may represent Fournier's gangrene. Inflammatory process extends to the undersurface of the penis. Inflammation extends along the inferior aspect of the inguinal canal greater on the right. Adjacent reactive adenopathy. These results were called by telephone at the time of interpretation on 02/25/2016 at 3:10 pm to Dr. Stark Jock, who verbally acknowledged these results. Electronically Signed   By: Genia Del M.D.   On: 02/25/2016 15:14   Dialysis Rx MWF [facility] 4.5 hours 400/600 2K 2.5 Ca EDW 119.5 kg 1.5 hectorol Venofer 50/week No heparin  Assessment/Plan  1. ESRD - MWF [facility]. Missed Monday. Hyperkalemic. HD today then again tomorrow to get back on schedule 2. S/p  I and D scrotal abscess 1/22 - ATB's (V/Z)/dressing changes 3. DM2 - per primary 4. Gout - usual meds 5. H/o DVT - resume coumadin   Jamal Maes,  MD [facility] [phone] pager 02/26/2016, 6:01 PM

## 2016-02-26 NOTE — Progress Notes (Signed)
Received post HD, report given by Cory Munch RN. Patient alert and oriented, wife at bedside.HD cath site, clean dry and intact.

## 2016-02-26 NOTE — Progress Notes (Addendum)
Pharmacy Antibiotic Note  Jack Irwin is a 51 y.o. male admitted on 02/25/2016 with scrotal abscess.  Pharmacy has been consulted for vancomycin dosing. Patient also being continued on zosyn. Patient has history of ESRD on HD MWF with last HD session 1/19.   Patient was started on zosyn 2.25g q8h and vancomycin 2g IV load. Will adjust zosyn based on HD dosing.  Plan: Vancomycin 1g IV qHD- give one time dose today (1/23) as going for HD, then qHD-MWF starting 1/24 Adjust zosyn to 3.375g IV q12h Monitor culture data, HD plans and clinical course VT at SS prn  Height: 5\' 11"  (180.3 cm) Weight: 273 lb 6.4 oz (124 kg) IBW/kg (Calculated) : 75.3  Temp (24hrs), Avg:98 F (36.7 C), Min:97.5 F (36.4 C), Max:98.5 F (36.9 C)   Recent Labs Lab 02/25/16 1046 02/26/16 0449  WBC 20.4* 20.1*  CREATININE 16.86* 18.20*    Estimated Creatinine Clearance: 6.7 mL/min (by C-G formula based on SCr of 18.2 mg/dL (H)).    No Known Allergies   Andrey Cota. Diona Foley, PharmD, BCPS Clinical Pharmacist [phone] 02/26/2016 7:54 AM

## 2016-02-27 DIAGNOSIS — E118 Type 2 diabetes mellitus with unspecified complications: Secondary | ICD-10-CM

## 2016-02-27 DIAGNOSIS — N186 End stage renal disease: Secondary | ICD-10-CM

## 2016-02-27 DIAGNOSIS — Z992 Dependence on renal dialysis: Secondary | ICD-10-CM

## 2016-02-27 DIAGNOSIS — M109 Gout, unspecified: Secondary | ICD-10-CM

## 2016-02-27 LAB — CBC
HCT: 28.7 % — ABNORMAL LOW (ref 39.0–52.0)
Hemoglobin: 9.3 g/dL — ABNORMAL LOW (ref 13.0–17.0)
MCH: 28.7 pg (ref 26.0–34.0)
MCHC: 32.4 g/dL (ref 30.0–36.0)
MCV: 88.6 fL (ref 78.0–100.0)
Platelets: 144 10*3/uL — ABNORMAL LOW (ref 150–400)
RBC: 3.24 MIL/uL — ABNORMAL LOW (ref 4.22–5.81)
RDW: 16.1 % — AB (ref 11.5–15.5)
WBC: 16.5 10*3/uL — ABNORMAL HIGH (ref 4.0–10.5)

## 2016-02-27 LAB — PROTIME-INR
INR: 3.48
PROTHROMBIN TIME: 35.8 s — AB (ref 11.4–15.2)

## 2016-02-27 LAB — RENAL FUNCTION PANEL
Albumin: 2.6 g/dL — ABNORMAL LOW (ref 3.5–5.0)
Anion gap: 10 (ref 5–15)
BUN: 30 mg/dL — AB (ref 6–20)
CHLORIDE: 96 mmol/L — AB (ref 101–111)
CO2: 27 mmol/L (ref 22–32)
CREATININE: 11.54 mg/dL — AB (ref 0.61–1.24)
Calcium: 8.5 mg/dL — ABNORMAL LOW (ref 8.9–10.3)
GFR calc Af Amer: 5 mL/min — ABNORMAL LOW (ref >60)
GFR calc non Af Amer: 5 mL/min — ABNORMAL LOW (ref >60)
Glucose, Bld: 95 mg/dL (ref 65–99)
Phosphorus: 6.6 mg/dL — ABNORMAL HIGH (ref 2.5–4.6)
Potassium: 4.8 mmol/L (ref 3.5–5.1)
Sodium: 133 mmol/L — ABNORMAL LOW (ref 135–145)

## 2016-02-27 LAB — GLUCOSE, CAPILLARY
Glucose-Capillary: 96 mg/dL (ref 65–99)
Glucose-Capillary: 86 mg/dL (ref 65–99)

## 2016-02-27 MED ORDER — MORPHINE SULFATE (PF) 4 MG/ML IV SOLN
2.0000 mg | INTRAVENOUS | Status: DC | PRN
  Administered 2016-02-27 – 2016-02-29 (×5): 2 mg via INTRAVENOUS
  Filled 2016-02-27 (×5): qty 1

## 2016-02-27 MED ORDER — VANCOMYCIN HCL IN DEXTROSE 1-5 GM/200ML-% IV SOLN
INTRAVENOUS | Status: AC
  Administered 2016-02-27: 1000 mg via INTRAVENOUS
  Filled 2016-02-27: qty 200

## 2016-02-27 MED ORDER — COLCHICINE 0.6 MG PO TABS
0.3000 mg | ORAL_TABLET | Freq: Every day | ORAL | Status: DC
  Administered 2016-02-27 – 2016-02-29 (×3): 0.3 mg via ORAL
  Filled 2016-02-27 (×3): qty 1

## 2016-02-27 MED ORDER — PHENOL 1.4 % MT LIQD
1.0000 | OROMUCOSAL | Status: DC | PRN
  Filled 2016-02-27: qty 177

## 2016-02-27 MED ORDER — VANCOMYCIN HCL IN DEXTROSE 1-5 GM/200ML-% IV SOLN
INTRAVENOUS | Status: AC
  Filled 2016-02-27: qty 200

## 2016-02-27 NOTE — Progress Notes (Signed)
ANTICOAGULATION CONSULT NOTE  Pharmacy Consult for Warfarin Indication: h/o DVT  No Known Allergies  Patient Measurements: Height: 5\' 11"  (180.3 cm) Weight: 263 lb 7.2 oz (119.5 kg) IBW/kg (Calculated) : 75.3  Vital Signs: Temp: 97.8 F (36.6 C) (01/24 1229) Temp Source: Oral (01/24 1229) BP: 127/83 (01/24 1229) Pulse Rate: 103 (01/24 1229)  Labs:  Recent Labs  02/25/16 1046 02/26/16 0449 02/26/16 0845 02/27/16 0538 02/27/16 0726  HGB 10.8* 10.3*  --   --  9.3*  HCT 32.8* 31.9*  --   --  28.7*  PLT 143* 141*  --   --  144*  LABPROT 29.9* 33.4*  --  35.8*  --   INR 2.78 3.20  --  3.48  --   CREATININE 16.86* 18.20* 18.28*  --  11.54*    Estimated Creatinine Clearance: 10.3 mL/min (by C-G formula based on SCr of 11.54 mg/dL (H)).   Medical History: Past Medical History:  Diagnosis Date  . Anemia   . Arthritis   . BPH (benign prostatic hyperplasia)   . Chronic back pain   . Difficulty walking   . DVT (deep venous thrombosis) ([place])    Right popliteal DVT December 2017  . End-stage renal disease on hemodialysis ([place])    Dr. Lowanda Foster  . Erectile dysfunction   . Essential hypertension   . Gout   . Neuropathy, diabetic ([place])   . Type 2 diabetes mellitus ([place])   . Venous (peripheral) insufficiency     Medications:  Prescriptions Prior to Admission  Medication Sig Dispense Refill Last Dose  . colchicine 0.6 MG tablet 1/2 tab po 2 times /week (Patient taking differently: Take 0.3 mg by mouth daily as needed (for gout). ) 15 tablet 0 02/24/2016 at Unknown time  . diclofenac sodium (VOLTAREN) 1 % GEL Apply to left wrist and hand three times daily 100 g 0 02/24/2016 at Unknown time  . gabapentin (NEURONTIN) 100 MG capsule Take 100 mg by mouth daily.    02/24/2016 at Unknown time  . Multiple Vitamin (DAILY VITE PO) Take 1 tablet by mouth daily.   02/24/2016 at Unknown time  . sildenafil (REVATIO) 20 MG tablet Take 20 mg by mouth as needed.   unknown  . ULORIC 80 MG  TABS Take 80 mg by mouth daily.   02/24/2016 at Unknown time  . warfarin (COUMADIN) 2.5 MG tablet Take 2.5 mg by mouth daily. Take 1/2 tablet daily   02/24/2016 at 0700  . doxycycline (VIBRA-TABS) 100 MG tablet Take 1 tablet (100 mg total) by mouth 2 (two) times daily. (Patient not taking: Reported on 02/25/2016) 20 tablet 0 Not Taking at Unknown time   Scheduled:  . febuxostat  80 mg Oral Daily  . gabapentin  100 mg Oral Daily  . mouth rinse  15 mL Mouth Rinse BID  .  morphine injection  4 mg Intravenous Once  . piperacillin-tazobactam (ZOSYN)  IV  3.375 g Intravenous Q12H  . vancomycin  1,000 mg Intravenous Q M,W,F-HD  . Warfarin - Pharmacist Dosing Inpatient   Does not apply q1800   Infusions:  . dextrose 25 mL/hr at 02/26/16 0055    Assessment: 51yo male with history of DVT on warfarin PTA presents with scrotal pain and swelling. Pharmacy is consulted to dose warfarin for history of DVT. Patient was taken to OR by urology for I&D 02/25/16. INR remains supratherapeutic at 3.48 with last dose taken 1/21.  PTA Warfarin Dose: 2.5mg /d  Goal of Therapy:  INR  2-3 Monitor platelets by anticoagulation protocol: Yes   Plan:  Hold warfarin tonight Daily INR Monitor s/sx of bleeding  Andrey Cota. Diona Foley, PharmD, [place] Clinical Pharmacist Pager [phone] 02/27/2016,1:38 PM

## 2016-02-27 NOTE — Progress Notes (Signed)
Pt requested to be pre-medicated before his dressing change. Then refused to have his dressing changed this morning. Pt stated that he will have his day shift nurse change his dressing later.

## 2016-02-27 NOTE — Progress Notes (Signed)
PROGRESS NOTE    Jack Irwin  HBZ:[id number] DOB: 1965/05/24 DOA: 02/25/2016 PCP: Kingsley Callander, FNP   Chief Complaint  Patient presents with  . Abscess     Brief Narrative:  HPI on 02/25/2016 by Dr. Tennis Must Jack Irwin is a 51 y.o. male with medical history significant of anemia, osteoarthritis, BPH, chronic back pain, DVT, ESRD on hemodialysis, erectile dysfunction, essential hypertension, gout, diabetic neuropathy, type 2 diabetes who is coming to the emergency department with complaints of pain and swelling on his right scrotal area. He denies fever, but feels chills and fatigue. He denies trauma to the area. He denies dysuria or hematuria. Assessment & Plan   Scrotal Abscess -S/p I&D by Dr. Pilar Jarvis, urology on 02/25/2016 -continue daily dressing changes/packing, antibiotics, and follow up in one month -Continue vanc/zosyn -Continue pain control -leukocytosis improving   ESRD -dialyzes MWF, missed Monday session -nephrology consulted and appreciated  Diabetes mellitus,type II complicated by hypoglycemia -medications held due to hypoglycemia  Gout -Continue uloric, colchicine  History of DVT -Continue coumadin  DVT Prophylaxis  Coumadin   Code Status: Full  Family Communication: None at bedside  Disposition Plan: Admitted.   Consultants Nephrology Urology  Procedures  I&D of scrotal abscess  Antibiotics   Anti-infectives    Start     Dose/Rate Route Frequency Ordered Stop   02/27/16 1200  vancomycin (VANCOCIN) IVPB 1000 mg/200 mL premix     1,000 mg 200 mL/hr over 60 Minutes Intravenous Every M-W-F (Hemodialysis) 02/26/16 0759     02/27/16 1042  vancomycin (VANCOCIN) 1-5 GM/200ML-% IVPB    Comments:  Jack Irwin, Jack Irwin   : cabinet override      02/27/16 1042 02/27/16 1100   02/26/16 1400  piperacillin-tazobactam (ZOSYN) IVPB 3.375 g     3.375 g 12.5 mL/hr over 240 Minutes Intravenous Every 12 hours 02/26/16 0759     02/26/16 1300   vancomycin (VANCOCIN) IVPB 1000 mg/200 mL premix     1,000 mg 200 mL/hr over 60 Minutes Intravenous Every T-Th-Sa (Hemodialysis) 02/26/16 1259 02/26/16 1753   02/26/16 0000  piperacillin-tazobactam (ZOSYN) IVPB 2.25 g  Status:  Discontinued     2.25 g 100 mL/hr over 30 Minutes Intravenous Every 8 hours 02/25/16 2345 02/26/16 0759   02/25/16 1145  vancomycin (VANCOCIN) 2,000 mg in sodium chloride 0.9 % 500 mL IVPB     2,000 mg 250 mL/hr over 120 Minutes Intravenous  Once 02/25/16 1138 02/25/16 1837   02/25/16 1045  piperacillin-tazobactam (ZOSYN) 2.25 g in dextrose 5 % 50 mL IVPB     2.25 g 100 mL/hr over 30 Minutes Intravenous  Once 02/25/16 1038 02/25/16 1802   02/25/16 1030  piperacillin-tazobactam (ZOSYN) IVPB 2.25 g  Status:  Discontinued     2.25 g 100 mL/hr over 30 Minutes Intravenous  Once 02/25/16 1025 02/25/16 1038      Subjective:   Jack Irwin seen and examined today.  Complains of scrotal pain and swelling.  Denies chest pain, shortness of breath, abdominal pain, N/V/D/C.   Objective:   Vitals:   02/27/16 1100 02/27/16 1130 02/27/16 1151 02/27/16 1229  BP: 122/77 117/80 107/84 127/83  Pulse: 84 75 (!) 101 (!) 103  Resp:   16 18  Temp:   98.1 F (36.7 C) 97.8 F (36.6 C)  TempSrc:   Oral Oral  SpO2:   95% 100%  Weight:   119.5 kg (263 lb 7.2 oz)   Height:  Intake/Output Summary (Last 24 hours) at 02/27/16 1503 Last data filed at 02/27/16 1411  Gross per 24 hour  Intake           891.67 ml  Output             4500 ml  Net         -3608.33 ml   Filed Weights   02/27/16 0534 02/27/16 0715 02/27/16 1151  Weight: 121.5 kg (267 lb 13.7 oz) 120.8 kg (266 lb 5.1 oz) 119.5 kg (263 lb 7.2 oz)    Exam  General: Well developed, well nourished, NAD, appears stated age  61: NCAT, mucous membranes moist.   Cardiovascular: S1 S2 auscultated,no murmurs, RRR  Respiratory: Clear to auscultation bilaterally with equal chest rise  Abdomen: Soft, obese,  nontender, nondistended, + bowel sounds  GU: scrotal edema, dressing in place.   Extremities: warm dry without cyanosis clubbing or edema  Neuro: AAOx3, nonfocal  Psych: Normal affect and demeanor    Data Reviewed: I have personally reviewed following labs and imaging studies  CBC:  Recent Labs Lab 02/25/16 1046 02/26/16 0449 02/27/16 0726  WBC 20.4* 20.1* 16.5*  NEUTROABS 17.3* 17.6*  --   HGB 10.8* 10.3* 9.3*  HCT 32.8* 31.9* 28.7*  MCV 89.6 89.4 88.6  PLT 143* 141* 944*   Basic Metabolic Panel:  Recent Labs Lab 02/25/16 1046 02/26/16 0449 02/26/16 0845 02/27/16 0726  NA 135 135 133* 133*  K 4.9 6.5* 6.3* 4.8  CL 98* 100* 98* 96*  CO2 24 24 20* 27  GLUCOSE 88 99 91 95  BUN 64* 68* 70* 30*  CREATININE 16.86* 18.20* 18.28* 11.54*  CALCIUM 8.8* 8.8* 8.8* 8.5*  PHOS  --   --   --  6.6*   GFR: Estimated Creatinine Clearance: 10.3 mL/min (by C-G formula based on SCr of 11.54 mg/dL (H)). Liver Function Tests:  Recent Labs Lab 02/26/16 0449 02/27/16 0726  AST 22  --   ALT 19  --   ALKPHOS 62  --   BILITOT 0.8  --   PROT 7.4  --   ALBUMIN 3.0* 2.6*   No results for input(s): LIPASE, AMYLASE in the last 168 hours. No results for input(s): AMMONIA in the last 168 hours. Coagulation Profile:  Recent Labs Lab 02/25/16 1046 02/26/16 0449 02/27/16 0538  INR 2.78 3.20 3.48   Cardiac Enzymes: No results for input(s): CKTOTAL, CKMB, CKMBINDEX, TROPONINI in the last 168 hours. BNP (last 3 results) No results for input(s): PROBNP in the last 8760 hours. HbA1C: No results for input(s): HGBA1C in the last 72 hours. CBG:  Recent Labs Lab 02/26/16 1102 02/26/16 1753 02/26/16 2131 02/27/16 0616 02/27/16 1233  GLUCAP 86 102* 115* 96 86   Lipid Profile: No results for input(s): CHOL, HDL, LDLCALC, TRIG, CHOLHDL, LDLDIRECT in the last 72 hours. Thyroid Function Tests: No results for input(s): TSH, T4TOTAL, FREET4, T3FREE, THYROIDAB in the last 72  hours. Anemia Panel: No results for input(s): VITAMINB12, FOLATE, FERRITIN, TIBC, IRON, RETICCTPCT in the last 72 hours. Urine analysis:    Component Value Date/Time   COLORURINE YELLOW 08/28/2014 1728   APPEARANCEUR CLEAR 08/28/2014 1728   LABSPEC 1.020 08/28/2014 1728   PHURINE 5.5 08/28/2014 1728   GLUCOSEU NEGATIVE 08/28/2014 1728   HGBUR SMALL (A) 08/28/2014 1728   BILIRUBINUR NEGATIVE 08/28/2014 1728   KETONESUR NEGATIVE 08/28/2014 1728   PROTEINUR TRACE (A) 08/28/2014 1728   UROBILINOGEN 0.2 08/28/2014 1728   NITRITE NEGATIVE 08/28/2014 1728  LEUKOCYTESUR TRACE (A) 08/28/2014 1728   Sepsis Labs: @LABRCNTIP (procalcitonin:4,lacticidven:4)  ) Recent Results (from the past 240 hour(s))  Aerobic/Anaerobic Culture (surgical/deep wound)     Status: None (Preliminary result)   Collection Time: 02/25/16  9:00 PM  Result Value Ref Range Status   Specimen Description ABSCESS SCROTUM  Final   Special Requests NONE  Final   Gram Stain   Final    FEW WBC PRESENT,BOTH PMN AND MONONUCLEAR FEW GRAM POSITIVE COCCI IN PAIRS    Culture   Final    TOO YOUNG TO READ Performed at [facility], [place] [address], [place], [place] [address]    Report Status PENDING  Incomplete  MRSA PCR Screening     Status: None   Collection Time: 02/25/16 11:47 PM  Result Value Ref Range Status   MRSA by PCR NEGATIVE NEGATIVE Final    Comment:        The GeneXpert MRSA Assay (FDA approved for NASAL specimens only), is one component of a comprehensive MRSA colonization surveillance program. It is not intended to diagnose MRSA infection nor to guide or monitor treatment for MRSA infections.       Radiology Studies: No results found.   Scheduled Meds: . colchicine  0.3 mg Oral Daily  . febuxostat  80 mg Oral Daily  . gabapentin  100 mg Oral Daily  . mouth rinse  15 mL Mouth Rinse BID  .  morphine injection  4 mg Intravenous Once  . piperacillin-tazobactam (ZOSYN)  IV  3.375 g  Intravenous Q12H  . vancomycin  1,000 mg Intravenous Q M,W,F-HD  . Warfarin - Pharmacist Dosing Inpatient   Does not apply q1800   Continuous Infusions: . dextrose 25 mL/hr at 02/26/16 0055     LOS: 2 days   Time Spent in minutes   30 minutes  Jack Irwin D.O. on 02/27/2016 at 3:03 PM  Between 7am to 7pm - Pager - [phone]  After 7pm go to [email or web address] - password [email or web address]  And look for the night coverage person covering for me after hours  [facility] Office  [phone]

## 2016-02-27 NOTE — Progress Notes (Signed)
Patient refuses to have scrotal dressing changed at this time. States he will wait until his wife comes to the hospital to help him bathe. He requests dressing to be changed after his bath this evening.

## 2016-02-27 NOTE — ED Notes (Signed)
CRITICAL VALUE ALERT  Critical value received:  POC CBG = 40  Date of notification:  02/25/16  Time of notification:  1464  Critical value read back:Yes.    Nurse who received alert:  Rosealee Albee  MD notified (1st page):  Dr. Roderic Palau  Time of first page:  38  MD notified (2nd page):  Time of second page:  Responding MD:  Dr. Roderic Palau  Time MD responded:  [phone]

## 2016-02-27 NOTE — Procedures (Signed)
I have personally attended this patient's dialysis session.   2K bath (pre K down to 4.8 today) Using TDC 400 Pre weight 120.8 EDW 119.5 BP stable Now back on usual MWF schedule  Jamal Maes, MD [place] Pager 02/27/2016, 9:53 AM

## 2016-02-27 NOTE — Progress Notes (Signed)
[facility] Rounding Note  Subjective/Interval History:  Quiet night  Worried about the swelling in his scrotum Still "real sore" Had HD yest off schedule (missed Monday) Today getting back on schedule  Objective Vital signs in last 24 hours: Vitals:   02/27/16 0800 02/27/16 0830 02/27/16 0900 02/27/16 0930  BP: 125/75 112/81 102/78 114/79  Pulse: 99 98 100 86  Resp:      Temp:      TempSrc:      SpO2:      Weight:      Height:       Weight change: 5.058 kg (11 lb 2.4 oz)  Intake/Output Summary (Last 24 hours) at 02/27/16 0945 Last data filed at 02/27/16 0700  Gross per 24 hour  Intake           651.67 ml  Output             2800 ml  Net         -2148.33 ml   Physical Exam:  Blood pressure 114/79, pulse 86, temperature 98.4 F (36.9 C), temperature source Oral, resp. rate (!) 22, height 5\' 11"  (1.803 m), weight 120.8 kg (266 lb 5.1 oz), SpO2 95 %.  Middle aged AAM, seen in the dialysis unit TDC running at 400 Ant lung fields clear S1S2 No S3 no rub Soft NT abdomen Dressing (and I presume packing) in place scrotum with fair amount of swelling, no def induration  Recent Labs Lab 02/25/16 1046 02/26/16 0449 02/26/16 0845 02/27/16 0726  NA 135 135 133* 133*  K 4.9 6.5* 6.3* 4.8  CL 98* 100* 98* 96*  CO2 24 24 20* 27  GLUCOSE 88 99 91 95  BUN 64* 68* 70* 30*  CREATININE 16.86* 18.20* 18.28* 11.54*  CALCIUM 8.8* 8.8* 8.8* 8.5*  PHOS  --   --   --  6.6*    Recent Labs Lab 02/26/16 0449 02/27/16 0726  AST 22  --   ALT 19  --   ALKPHOS 62  --   BILITOT 0.8  --   PROT 7.4  --   ALBUMIN 3.0* 2.6*    Recent Labs Lab 02/25/16 1046 02/26/16 0449 02/27/16 0726  WBC 20.4* 20.1* 16.5*  NEUTROABS 17.3* 17.6*  --   HGB 10.8* 10.3* 9.3*  HCT 32.8* 31.9* 28.7*  MCV 89.6 89.4 88.6  PLT 143* 141* 144*   Lab Results  Component Value Date   INR 3.48 02/27/2016   INR 3.20 02/26/2016   INR 2.78 02/25/2016    Recent Labs Lab 02/26/16 0710 02/26/16 1102  02/26/16 1753 02/26/16 2131 02/27/16 0616  GLUCAP 83 86 102* 115* 96   1/22 Culture - pending (gm stain GPC pairs, WBC's)  Studies/Results: Ct Pelvis W Contrast  Result Date: 02/25/2016 CLINICAL DATA:  51 year old diabetic hypertensive male with abscess and testicles for 2 days. Initial encounter. EXAM: CT PELVIS WITH CONTRAST TECHNIQUE: Multidetector CT imaging of the pelvis was performed using the standard protocol following the bolus administration of intravenous contrast. CONTRAST:  163mL ISOVUE-300 IOPAMIDOL (ISOVUE-300) INJECTION 61% COMPARISON:  11/01/2015 CT. FINDINGS: Urinary Tract: Contracted urinary bladder with circumferential thickening. Slight impression upon the bladder base by prostate gland. Bowel: No extra luminal bowel inflammatory process. Left colon diverticula. Vascular/Lymphatic: Calcification left common iliac artery. Reproductive: Abnormal appearance of the scrotum with diffuse inflammation. Within the scrotum are ill-defined structures which may represent combination of abscess and testicle. No gas however this may represent Fournier's gangrene. Inflammatory process extends to the  undersurface of the penis. Inflammation extends along the inferior aspect of the inguinal canal greater on the right. Adjacent reactive adenopathy. Other:  None. Musculoskeletal: Schmorl's node deformity inferior endplate L4 and L5. IMPRESSION: Abnormal appearance of the scrotum with diffuse inflammation. Within the scrotum are ill-defined structures which may represent combination of abscess and testicle. No gas however this may represent Fournier's gangrene. Inflammatory process extends to the undersurface of the penis. Inflammation extends along the inferior aspect of the inguinal canal greater on the right. Adjacent reactive adenopathy. These results were called by telephone at the time of interpretation on 02/25/2016 at 3:10 pm to Dr. Stark Jock, who verbally acknowledged these results. Electronically  Signed   By: Genia Del M.D.   On: 02/25/2016 15:14   Medications: . dextrose 25 mL/hr at 02/26/16 0055   . febuxostat  80 mg Oral Daily  . gabapentin  100 mg Oral Daily  . mouth rinse  15 mL Mouth Rinse BID  .  morphine injection  4 mg Intravenous Once  . piperacillin-tazobactam (ZOSYN)  IV  3.375 g Intravenous Q12H  . vancomycin  1,000 mg Intravenous Q M,W,F-HD  . Warfarin - Pharmacist Dosing Inpatient   Does not apply q1800   Dialysis Rx MWF [facility] 4.5 hours 400/600 2K 2.5 Ca EDW 119.5 kg 1.5 hectorol Venofer 50/week No heparin  Background:  51 y.o. year-old AAM with DM, HTN, ESRD on MWF HD ([facility]), H/O DVT.  Went to ED with 2 days of scrotal pain, found to have scrotal abscess by CT, had surgery at [facility] by Urology and was transferred here so could receive HD. Getting ATB's for the abscess.  Assessment/Recommendations  1. ESRD - MWF [facility]. Missed Monday. Hyperkalemic on adm. Had HD yesterday off schedule, and again today to get back on usual MWF schedule.  2. S/P I and D scrotal abscess 1/22 - ATB's (V/Z)/dressing changes. WBC coming down. 3. DM2 - per primary 4. Gout - usual meds 5. H/o DVT - INR 3.48 today. Pharmacy dosing.  Jamal Maes, MD [facility] [phone] pager 02/27/2016, 9:45 AM

## 2016-02-28 ENCOUNTER — Ambulatory Visit (HOSPITAL_COMMUNITY): Payer: Medicare Other

## 2016-02-28 ENCOUNTER — Ambulatory Visit: Payer: Medicare Other | Admitting: Vascular Surgery

## 2016-02-28 DIAGNOSIS — I10 Essential (primary) hypertension: Secondary | ICD-10-CM

## 2016-02-28 LAB — RENAL FUNCTION PANEL
ALBUMIN: 2.6 g/dL — AB (ref 3.5–5.0)
Anion gap: 13 (ref 5–15)
BUN: 24 mg/dL — AB (ref 6–20)
CHLORIDE: 97 mmol/L — AB (ref 101–111)
CO2: 25 mmol/L (ref 22–32)
Calcium: 8.9 mg/dL (ref 8.9–10.3)
Creatinine, Ser: 8.44 mg/dL — ABNORMAL HIGH (ref 0.61–1.24)
GFR calc Af Amer: 8 mL/min — ABNORMAL LOW (ref >60)
GFR calc non Af Amer: 7 mL/min — ABNORMAL LOW (ref >60)
GLUCOSE: 100 mg/dL — AB (ref 65–99)
PHOSPHORUS: 6.1 mg/dL — AB (ref 2.5–4.6)
POTASSIUM: 4 mmol/L (ref 3.5–5.1)
Sodium: 135 mmol/L (ref 135–145)

## 2016-02-28 LAB — GLUCOSE, CAPILLARY
GLUCOSE-CAPILLARY: 112 mg/dL — AB (ref 65–99)
GLUCOSE-CAPILLARY: 96 mg/dL (ref 65–99)
GLUCOSE-CAPILLARY: 84 mg/dL (ref 65–99)
Glucose-Capillary: 128 mg/dL — ABNORMAL HIGH (ref 65–99)
Glucose-Capillary: 109 mg/dL — ABNORMAL HIGH (ref 65–99)
Glucose-Capillary: 114 mg/dL — ABNORMAL HIGH (ref 65–99)

## 2016-02-28 LAB — CBC
HEMATOCRIT: 29.5 % — AB (ref 39.0–52.0)
Hemoglobin: 9.5 g/dL — ABNORMAL LOW (ref 13.0–17.0)
MCH: 28.7 pg (ref 26.0–34.0)
MCHC: 32.2 g/dL (ref 30.0–36.0)
MCV: 89.1 fL (ref 78.0–100.0)
Platelets: 166 10*3/uL (ref 150–400)
RBC: 3.31 MIL/uL — ABNORMAL LOW (ref 4.22–5.81)
RDW: 16.3 % — AB (ref 11.5–15.5)
WBC: 13.7 10*3/uL — ABNORMAL HIGH (ref 4.0–10.5)

## 2016-02-28 LAB — PROTIME-INR
INR: 2.65
PROTHROMBIN TIME: 28.8 s — AB (ref 11.4–15.2)

## 2016-02-28 MED ORDER — WARFARIN SODIUM 2.5 MG PO TABS
2.5000 mg | ORAL_TABLET | Freq: Once | ORAL | Status: AC
  Administered 2016-02-28: 2.5 mg via ORAL
  Filled 2016-02-28: qty 1

## 2016-02-28 MED ORDER — SEVELAMER CARBONATE 800 MG PO TABS
3200.0000 mg | ORAL_TABLET | Freq: Three times a day (TID) | ORAL | Status: DC
  Administered 2016-02-28 – 2016-02-29 (×2): 3200 mg via ORAL
  Filled 2016-02-28 (×2): qty 4

## 2016-02-28 MED ORDER — CLINDAMYCIN HCL 300 MG PO CAPS
300.0000 mg | ORAL_CAPSULE | Freq: Three times a day (TID) | ORAL | Status: DC
  Administered 2016-02-28 – 2016-02-29 (×3): 300 mg via ORAL
  Filled 2016-02-28 (×4): qty 1

## 2016-02-28 MED ORDER — SEVELAMER CARBONATE 800 MG PO TABS
800.0000 mg | ORAL_TABLET | Freq: Two times a day (BID) | ORAL | Status: DC
  Filled 2016-02-28: qty 1

## 2016-02-28 NOTE — Progress Notes (Signed)
PROGRESS NOTE    Jack Irwin  PFX:[id number] DOB: Nov 01, 1965 DOA: 02/25/2016 PCP: Kingsley Callander, FNP   Chief Complaint  Patient presents with  . Abscess     Brief Narrative:  HPI on 02/25/2016 by Dr. Tennis Must Jack Irwin is a 51 y.o. male with medical history significant of anemia, osteoarthritis, BPH, chronic back pain, DVT, ESRD on hemodialysis, erectile dysfunction, essential hypertension, gout, diabetic neuropathy, type 2 diabetes who is coming to the emergency department with complaints of pain and swelling on his right scrotal area. He denies fever, but feels chills and fatigue. He denies trauma to the area. He denies dysuria or hematuria. Assessment & Plan   Scrotal Abscess -S/p I&D by Dr. Pilar Jarvis, urology on 02/25/2016 -continue daily dressing changes/packing, antibiotics, and follow up in one month -Initially placed on vanc/zosyn -Continue pain control -leukocytosis improving  -Continues to have scrotal edema, have ordered for scrotal support.  Instructed patient on placing a towel underneath his scrotum for elevation.  -Will attempt to reach urology for any further recommendations.   ESRD -dialyzes MWF, missed Monday session -nephrology consulted and appreciated  Diabetes mellitus,type II complicated by hypoglycemia -medications held due to hypoglycemia  Gout -Continue uloric, colchicine  History of DVT -Continue coumadin  DVT Prophylaxis  Coumadin   Code Status: Full  Family Communication: None at bedside  Disposition Plan: Admitted. Possible discharge to home on 02/29/2016.   Consultants Nephrology Urology  Procedures  I&D of scrotal abscess  Antibiotics   Anti-infectives    Start     Dose/Rate Route Frequency Ordered Stop   02/27/16 1200  vancomycin (VANCOCIN) IVPB 1000 mg/200 mL premix     1,000 mg 200 mL/hr over 60 Minutes Intravenous Every M-W-F (Hemodialysis) 02/26/16 0759     02/27/16 1042  vancomycin (VANCOCIN) 1-5  GM/200ML-% IVPB    Comments:  [place], [place]   : cabinet override      02/27/16 1042 02/27/16 1100   02/26/16 1400  piperacillin-tazobactam (ZOSYN) IVPB 3.375 g     3.375 g 12.5 mL/hr over 240 Minutes Intravenous Every 12 hours 02/26/16 0759     02/26/16 1300  vancomycin (VANCOCIN) IVPB 1000 mg/200 mL premix     1,000 mg 200 mL/hr over 60 Minutes Intravenous Every T-Th-Sa (Hemodialysis) 02/26/16 1259 02/26/16 1753   02/26/16 0000  piperacillin-tazobactam (ZOSYN) IVPB 2.25 g  Status:  Discontinued     2.25 g 100 mL/hr over 30 Minutes Intravenous Every 8 hours 02/25/16 2345 02/26/16 0759   02/25/16 1145  vancomycin (VANCOCIN) 2,000 mg in sodium chloride 0.9 % 500 mL IVPB     2,000 mg 250 mL/hr over 120 Minutes Intravenous  Once 02/25/16 1138 02/25/16 1837   02/25/16 1045  piperacillin-tazobactam (ZOSYN) 2.25 g in dextrose 5 % 50 mL IVPB     2.25 g 100 mL/hr over 30 Minutes Intravenous  Once 02/25/16 1038 02/25/16 1802   02/25/16 1030  piperacillin-tazobactam (ZOSYN) IVPB 2.25 g  Status:  Discontinued     2.25 g 100 mL/hr over 30 Minutes Intravenous  Once 02/25/16 1025 02/25/16 1038      Subjective:   Jack Irwin seen and examined today.  Complains of scrotal pain and swelling. Does not feel he or his wife can chang his dressings and would like home health. Denies chest pain, shortness of breath, abdominal pain, N/V/D/C.   Objective:   Vitals:   02/27/16 1229 02/27/16 1556 02/27/16 2212 02/28/16 0531  BP: 127/83 (!) 98/57 98/69 108/65  Pulse: (!) 103 (!) 108 (!) 110 (!) 115  Resp: 18 20 20 20   Temp: 97.8 F (36.6 C) 98.6 F (37 C) 98.4 F (36.9 C) 97.8 F (36.6 C)  TempSrc: Oral Oral Oral Oral  SpO2: 100% 97% 94% 97%  Weight:    119.3 kg (263 lb)  Height:        Intake/Output Summary (Last 24 hours) at 02/28/16 1119 Last data filed at 02/28/16 1100  Gross per 24 hour  Intake             1640 ml  Output             1700 ml  Net              -60 ml   Filed  Weights   02/27/16 0715 02/27/16 1151 02/28/16 0531  Weight: 120.8 kg (266 lb 5.1 oz) 119.5 kg (263 lb 7.2 oz) 119.3 kg (263 lb)    Exam  General: Well developed, well nourished, NAD, appears stated age  HEENT: NCAT, mucous membranes moist.   Cardiovascular: S1 S2 auscultated,no murmurs, RRR  Respiratory: Clear to auscultation bilaterally  Abdomen: Soft, obese, nontender, nondistended, + bowel sounds  GU: scrotal edema, dressing in place.   Extremities: warm dry without cyanosis clubbing or edema  Neuro: AAOx3, nonfocal  Psych: Normal affect and demeanor    Data Reviewed: I have personally reviewed following labs and imaging studies  CBC:  Recent Labs Lab 02/25/16 1046 02/26/16 0449 02/27/16 0726 02/28/16 0404  WBC 20.4* 20.1* 16.5* 13.7*  NEUTROABS 17.3* 17.6*  --   --   HGB 10.8* 10.3* 9.3* 9.5*  HCT 32.8* 31.9* 28.7* 29.5*  MCV 89.6 89.4 88.6 89.1  PLT 143* 141* 144* 086   Basic Metabolic Panel:  Recent Labs Lab 02/25/16 1046 02/26/16 0449 02/26/16 0845 02/27/16 0726 02/28/16 0404  NA 135 135 133* 133* 135  K 4.9 6.5* 6.3* 4.8 4.0  CL 98* 100* 98* 96* 97*  CO2 24 24 20* 27 25  GLUCOSE 88 99 91 95 100*  BUN 64* 68* 70* 30* 24*  CREATININE 16.86* 18.20* 18.28* 11.54* 8.44*  CALCIUM 8.8* 8.8* 8.8* 8.5* 8.9  PHOS  --   --   --  6.6* 6.1*   GFR: Estimated Creatinine Clearance: 14.1 mL/min (by C-G formula based on SCr of 8.44 mg/dL (H)). Liver Function Tests:  Recent Labs Lab 02/26/16 0449 02/27/16 0726 02/28/16 0404  AST 22  --   --   ALT 19  --   --   ALKPHOS 62  --   --   BILITOT 0.8  --   --   PROT 7.4  --   --   ALBUMIN 3.0* 2.6* 2.6*   No results for input(s): LIPASE, AMYLASE in the last 168 hours. No results for input(s): AMMONIA in the last 168 hours. Coagulation Profile:  Recent Labs Lab 02/25/16 1046 02/26/16 0449 02/27/16 0538 02/28/16 0404  INR 2.78 3.20 3.48 2.65   Cardiac Enzymes: No results for input(s): CKTOTAL,  CKMB, CKMBINDEX, TROPONINI in the last 168 hours. BNP (last 3 results) No results for input(s): PROBNP in the last 8760 hours. HbA1C: No results for input(s): HGBA1C in the last 72 hours. CBG:  Recent Labs Lab 02/26/16 2131 02/27/16 0616 02/27/16 1233 02/27/16 2214 02/28/16 0623  GLUCAP 115* 96 86 128* 109*   Lipid Profile: No results for input(s): CHOL, HDL, LDLCALC, TRIG, CHOLHDL, LDLDIRECT in the last 72 hours. Thyroid Function Tests: No  results for input(s): TSH, T4TOTAL, FREET4, T3FREE, THYROIDAB in the last 72 hours. Anemia Panel: No results for input(s): VITAMINB12, FOLATE, FERRITIN, TIBC, IRON, RETICCTPCT in the last 72 hours. Urine analysis:    Component Value Date/Time   COLORURINE YELLOW 08/28/2014 1728   APPEARANCEUR CLEAR 08/28/2014 1728   LABSPEC 1.020 08/28/2014 1728   PHURINE 5.5 08/28/2014 1728   GLUCOSEU NEGATIVE 08/28/2014 1728   HGBUR SMALL (A) 08/28/2014 1728   BILIRUBINUR NEGATIVE 08/28/2014 1728   KETONESUR NEGATIVE 08/28/2014 1728   PROTEINUR TRACE (A) 08/28/2014 1728   UROBILINOGEN 0.2 08/28/2014 1728   NITRITE NEGATIVE 08/28/2014 1728   LEUKOCYTESUR TRACE (A) 08/28/2014 1728   Sepsis Labs: @LABRCNTIP (procalcitonin:4,lacticidven:4)  ) Recent Results (from the past 240 hour(s))  Aerobic/Anaerobic Culture (surgical/deep wound)     Status: None (Preliminary result)   Collection Time: 02/25/16  9:00 PM  Result Value Ref Range Status   Specimen Description ABSCESS SCROTUM  Final   Special Requests NONE  Final   Gram Stain   Final    FEW WBC PRESENT,BOTH PMN AND MONONUCLEAR FEW GRAM POSITIVE COCCI IN PAIRS    Culture   Final    TOO YOUNG TO READ Performed at [facility], [place] [address], [place], [place] [address]    Report Status PENDING  Incomplete  MRSA PCR Screening     Status: None   Collection Time: 02/25/16 11:47 PM  Result Value Ref Range Status   MRSA by PCR NEGATIVE NEGATIVE Final    Comment:        The GeneXpert MRSA  Assay (FDA approved for NASAL specimens only), is one component of a comprehensive MRSA colonization surveillance program. It is not intended to diagnose MRSA infection nor to guide or monitor treatment for MRSA infections.       Radiology Studies: No results found.   Scheduled Meds: . colchicine  0.3 mg Oral Daily  . febuxostat  80 mg Oral Daily  . gabapentin  100 mg Oral Daily  . mouth rinse  15 mL Mouth Rinse BID  .  morphine injection  4 mg Intravenous Once  . piperacillin-tazobactam (ZOSYN)  IV  3.375 g Intravenous Q12H  . vancomycin  1,000 mg Intravenous Q M,W,F-HD  . warfarin  2.5 mg Oral ONCE-1800  . Warfarin - Pharmacist Dosing Inpatient   Does not apply q1800   Continuous Infusions: . dextrose 25 mL/hr at 02/26/16 0055     LOS: 3 days   Time Spent in minutes   30 minutes  Sophira Rumler D.O. on 02/28/2016 at 11:19 AM  Between 7am to 7pm - Pager - [phone]  After 7pm go to [email or web address] - password [email or web address]  And look for the night coverage person covering for me after hours  [facility] Office  [phone]

## 2016-02-28 NOTE — Progress Notes (Signed)
[facility] Rounding Note  Subjective/Interval History:  Still "sore and swollen" Waiting for a "sling" for his scrotum Wife in w/him Asked about his K (wife says not compliant with diet) Says not getting his binders (sounds like he takes Renvela)  Objective Vital signs in last 24 hours: Vitals:   02/27/16 1229 02/27/16 1556 02/27/16 2212 02/28/16 0531  BP: 127/83 (!) 98/57 98/69 108/65  Pulse: (!) 103 (!) 108 (!) 110 (!) 115  Resp: 18 20 20 20   Temp: 97.8 F (36.6 C) 98.6 F (37 C) 98.4 F (36.9 C) 97.8 F (36.6 C)  TempSrc: Oral Oral Oral Oral  SpO2: 100% 97% 94% 97%  Weight:    119.3 kg (263 lb)  Height:       Weight change: -3.1 kg (-6 lb 13.4 oz)  Intake/Output Summary (Last 24 hours) at 02/28/16 1308 Last data filed at 02/28/16 1100  Gross per 24 hour  Intake             1640 ml  Output                0 ml  Net             1640 ml   Physical Exam:  Blood pressure 108/65, pulse (!) 115, temperature 97.8 F (36.6 C), temperature source Oral, resp. rate 20, height 5\' 11"  (1.803 m), weight 119.3 kg (263 lb), SpO2 97 %.  Middle aged AAM NAD lying in bed TDC clean dry dressing Ant lung fields clear S1S2 No S3 no rub Soft NT abdomen Dressing (and I presume packing) in place scrotum with fair amount of swelling  Recent Labs Lab 02/25/16 1046 02/26/16 0449 02/26/16 0845 02/27/16 0726 02/28/16 0404  NA 135 135 133* 133* 135  K 4.9 6.5* 6.3* 4.8 4.0  CL 98* 100* 98* 96* 97*  CO2 24 24 20* 27 25  GLUCOSE 88 99 91 95 100*  BUN 64* 68* 70* 30* 24*  CREATININE 16.86* 18.20* 18.28* 11.54* 8.44*  CALCIUM 8.8* 8.8* 8.8* 8.5* 8.9  PHOS  --   --   --  6.6* 6.1*    Recent Labs Lab 02/26/16 0449 02/27/16 0726 02/28/16 0404  AST 22  --   --   ALT 19  --   --   ALKPHOS 62  --   --   BILITOT 0.8  --   --   PROT 7.4  --   --   ALBUMIN 3.0* 2.6* 2.6*    Recent Labs Lab 02/25/16 1046 02/26/16 0449 02/27/16 0726 02/28/16 0404  WBC 20.4* 20.1* 16.5* 13.7*   NEUTROABS 17.3* 17.6*  --   --   HGB 10.8* 10.3* 9.3* 9.5*  HCT 32.8* 31.9* 28.7* 29.5*  MCV 89.6 89.4 88.6 89.1  PLT 143* 141* 144* 166   Lab Results  Component Value Date   INR 2.65 02/28/2016   INR 3.48 02/27/2016   INR 3.20 02/26/2016    Recent Labs Lab 02/27/16 1233 02/27/16 1647 02/27/16 2214 02/28/16 0623 02/28/16 1122  GLUCAP 86 112* 128* 109* 96   1/22 Culture - pending (gm stain GPC pairs, WBC's)"reincubated"  Studies/Results: Ct Pelvis W Contrast  Result Date: 02/25/2016 CLINICAL DATA:  51 year old diabetic hypertensive male with abscess and testicles for 2 days. Initial encounter. EXAM: CT PELVIS WITH CONTRAST TECHNIQUE: Multidetector CT imaging of the pelvis was performed using the standard protocol following the bolus administration of intravenous contrast. CONTRAST:  140mL ISOVUE-300 IOPAMIDOL (ISOVUE-300) INJECTION 61% COMPARISON:  11/01/2015  CT. FINDINGS: Urinary Tract: Contracted urinary bladder with circumferential thickening. Slight impression upon the bladder base by prostate gland. Bowel: No extra luminal bowel inflammatory process. Left colon diverticula. Vascular/Lymphatic: Calcification left common iliac artery. Reproductive: Abnormal appearance of the scrotum with diffuse inflammation. Within the scrotum are ill-defined structures which may represent combination of abscess and testicle. No gas however this may represent Fournier's gangrene. Inflammatory process extends to the undersurface of the penis. Inflammation extends along the inferior aspect of the inguinal canal greater on the right. Adjacent reactive adenopathy. Other:  None. Musculoskeletal: Schmorl's node deformity inferior endplate L4 and L5. IMPRESSION: Abnormal appearance of the scrotum with diffuse inflammation. Within the scrotum are ill-defined structures which may represent combination of abscess and testicle. No gas however this may represent Fournier's gangrene. Inflammatory process extends  to the undersurface of the penis. Inflammation extends along the inferior aspect of the inguinal canal greater on the right. Adjacent reactive adenopathy. These results were called by telephone at the time of interpretation on 02/25/2016 at 3:10 pm to Dr. Stark Jock, who verbally acknowledged these results. Electronically Signed   By: Genia Del M.D.   On: 02/25/2016 15:14   .Medications: . dextrose 25 mL/hr at 02/26/16 0055   . clindamycin  300 mg Oral Q8H  . colchicine  0.3 mg Oral Daily  . febuxostat  80 mg Oral Daily  . gabapentin  100 mg Oral Daily  . mouth rinse  15 mL Mouth Rinse BID  .  morphine injection  4 mg Intravenous Once  . warfarin  2.5 mg Oral ONCE-1800  . Warfarin - Pharmacist Dosing Inpatient   Does not apply q1800   Dialysis Rx MWF [facility] 4.5 hours 400/600 2K 2.5 Ca EDW 119.5 kg 1.5 hectorol Venofer 50/week No heparin  Background:  51 y.o. year-old AAM with DM, HTN, ESRD on MWF HD ([facility]), H/O DVT.  Went to ED with 2 days of scrotal pain, found to have scrotal abscess by CT, had surgery at [facility] by Urology and was transferred here so could receive HD. Getting ATB's for the abscess.  Assessment/Recommendations  1. ESRD - MWF [facility]. Missed Monday. Hyperkalemic on adm. S/p HD Tuesday and Wednesday, back on schedule, Next HD 1/26. 2. S/P I and D scrotal abscess 1/22 - ATB's (V/Z)/dressing changes. 3. DM2 - per primary 4. Gout - usual meds 5. H/o DVT - INR 2.65 today. Pharmacy dosing. 6. CKD-MBD - resume binders 4ac, 2 snacks (Renvela)  Jamal Maes, MD [facility] [phone] pager 02/28/2016, 1:08 PM

## 2016-02-28 NOTE — Progress Notes (Signed)
ANTICOAGULATION CONSULT NOTE  Pharmacy Consult for Warfarin Indication: h/o DVT  No Known Allergies  Patient Measurements: Height: 5\' 11"  (180.3 cm) Weight: 263 lb (119.3 kg) (Pt unable to stand. ) IBW/kg (Calculated) : 75.3  Vital Signs: Temp: 97.8 F (36.6 C) (01/25 0531) Temp Source: Oral (01/25 0531) BP: 108/65 (01/25 0531) Pulse Rate: 115 (01/25 0531)  Labs:  Recent Labs  02/26/16 0449 02/26/16 0845 02/27/16 0538 02/27/16 0726 02/28/16 0404  HGB 10.3*  --   --  9.3* 9.5*  HCT 31.9*  --   --  28.7* 29.5*  PLT 141*  --   --  144* 166  LABPROT 33.4*  --  35.8*  --  28.8*  INR 3.20  --  3.48  --  2.65  CREATININE 18.20* 18.28*  --  11.54* 8.44*    Estimated Creatinine Clearance: 14.1 mL/min (by C-G formula based on SCr of 8.44 mg/dL (H)).   Medical History: Past Medical History:  Diagnosis Date  . Anemia   . Arthritis   . BPH (benign prostatic hyperplasia)   . Chronic back pain   . Difficulty walking   . DVT (deep venous thrombosis) ([place])    Right popliteal DVT December 2017  . End-stage renal disease on hemodialysis ([place])    Dr. Lowanda Foster  . Erectile dysfunction   . Essential hypertension   . Gout   . Neuropathy, diabetic ([place])   . Type 2 diabetes mellitus ([place])   . Venous (peripheral) insufficiency     Medications:  Prescriptions Prior to Admission  Medication Sig Dispense Refill Last Dose  . colchicine 0.6 MG tablet 1/2 tab po 2 times /week (Patient taking differently: Take 0.3 mg by mouth daily as needed (for gout). ) 15 tablet 0 02/24/2016 at Unknown time  . diclofenac sodium (VOLTAREN) 1 % GEL Apply to left wrist and hand three times daily 100 g 0 02/24/2016 at Unknown time  . gabapentin (NEURONTIN) 100 MG capsule Take 100 mg by mouth daily.    02/24/2016 at Unknown time  . Multiple Vitamin (DAILY VITE PO) Take 1 tablet by mouth daily.   02/24/2016 at Unknown time  . sildenafil (REVATIO) 20 MG tablet Take 20 mg by mouth as needed.   unknown  .  ULORIC 80 MG TABS Take 80 mg by mouth daily.   02/24/2016 at Unknown time  . warfarin (COUMADIN) 2.5 MG tablet Take 2.5 mg by mouth daily. Take 1/2 tablet daily   02/24/2016 at 0700  . doxycycline (VIBRA-TABS) 100 MG tablet Take 1 tablet (100 mg total) by mouth 2 (two) times daily. (Patient not taking: Reported on 02/25/2016) 20 tablet 0 Not Taking at Unknown time   Scheduled:  . colchicine  0.3 mg Oral Daily  . febuxostat  80 mg Oral Daily  . gabapentin  100 mg Oral Daily  . mouth rinse  15 mL Mouth Rinse BID  .  morphine injection  4 mg Intravenous Once  . piperacillin-tazobactam (ZOSYN)  IV  3.375 g Intravenous Q12H  . vancomycin  1,000 mg Intravenous Q M,W,F-HD  . Warfarin - Pharmacist Dosing Inpatient   Does not apply q1800   Infusions:  . dextrose 25 mL/hr at 02/26/16 0055    Assessment: 51yo male with history of DVT on warfarin PTA presents with scrotal pain and swelling. Pharmacy is consulted to dose warfarin for history of DVT. Patient was taken to OR by urology for I&D 02/25/16. INR is now therapeutic at 2.65 with last dose taken  1/21.  PTA Warfarin Dose: 2.5mg /d  Goal of Therapy:  INR 2-3 Monitor platelets by anticoagulation protocol: Yes   Plan:  Warfarin 2.5mg  tonight x1 Daily INR Monitor s/sx of bleeding  Andrey Cota. Diona Foley, PharmD, BCPS Clinical Pharmacist Pager [phone] 02/28/2016,8:46 AM

## 2016-02-29 LAB — RENAL FUNCTION PANEL
ALBUMIN: 2.2 g/dL — AB (ref 3.5–5.0)
ANION GAP: 12 (ref 5–15)
BUN: 47 mg/dL — ABNORMAL HIGH (ref 6–20)
CO2: 27 mmol/L (ref 22–32)
Calcium: 8.6 mg/dL — ABNORMAL LOW (ref 8.9–10.3)
Chloride: 95 mmol/L — ABNORMAL LOW (ref 101–111)
Creatinine, Ser: 12.47 mg/dL — ABNORMAL HIGH (ref 0.61–1.24)
GFR calc Af Amer: 5 mL/min — ABNORMAL LOW (ref >60)
GFR calc non Af Amer: 4 mL/min — ABNORMAL LOW (ref >60)
GLUCOSE: 107 mg/dL — AB (ref 65–99)
PHOSPHORUS: 8.4 mg/dL — AB (ref 2.5–4.6)
POTASSIUM: 4.7 mmol/L (ref 3.5–5.1)
Sodium: 134 mmol/L — ABNORMAL LOW (ref 135–145)

## 2016-02-29 LAB — CBC
HEMATOCRIT: 26.9 % — AB (ref 39.0–52.0)
HEMOGLOBIN: 8.7 g/dL — AB (ref 13.0–17.0)
MCH: 28.4 pg (ref 26.0–34.0)
MCHC: 32.3 g/dL (ref 30.0–36.0)
MCV: 87.9 fL (ref 78.0–100.0)
Platelets: 182 10*3/uL (ref 150–400)
RBC: 3.06 MIL/uL — ABNORMAL LOW (ref 4.22–5.81)
RDW: 15.7 % — ABNORMAL HIGH (ref 11.5–15.5)
WBC: 15.7 10*3/uL — ABNORMAL HIGH (ref 4.0–10.5)

## 2016-02-29 LAB — GLUCOSE, CAPILLARY: GLUCOSE-CAPILLARY: 111 mg/dL — AB (ref 65–99)

## 2016-02-29 LAB — PROTIME-INR
INR: 2.66
Prothrombin Time: 28.9 seconds — ABNORMAL HIGH (ref 11.4–15.2)

## 2016-02-29 MED ORDER — CLINDAMYCIN HCL 300 MG PO CAPS: 300.0000 mg | ORAL_CAPSULE | Freq: Three times a day (TID) | ORAL | 0 refills | Status: DC

## 2016-02-29 MED ORDER — TRAMADOL HCL 50 MG PO TABS: 50.0000 mg | ORAL_TABLET | Freq: Every day | ORAL | 0 refills | Status: DC | PRN

## 2016-02-29 MED ORDER — SEVELAMER CARBONATE 800 MG PO TABS: 3200.0000 mg | ORAL_TABLET | Freq: Three times a day (TID) | ORAL | 0 refills | Status: DC

## 2016-02-29 MED ORDER — SEVELAMER CARBONATE 800 MG PO TABS
800.0000 mg | ORAL_TABLET | Freq: Two times a day (BID) | ORAL | Status: DC | PRN
  Filled 2016-02-29: qty 1

## 2016-02-29 MED ORDER — WARFARIN SODIUM 2.5 MG PO TABS
2.5000 mg | ORAL_TABLET | Freq: Once | ORAL | Status: DC
  Filled 2016-02-29: qty 1

## 2016-02-29 MED ORDER — SEVELAMER CARBONATE 800 MG PO TABS: 800.0000 mg | ORAL_TABLET | Freq: Two times a day (BID) | ORAL | 0 refills | Status: DC | PRN

## 2016-02-29 NOTE — Procedures (Signed)
I have personally attended this patient's dialysis session.  Back on usual MWF schedule  Only 0.4 kg over EDW so min vol to remove Using TDC 2K bath (K 4.5)  Looks well though BP a little soft (low 100's) Lungs clear Still w/scrotal edema ("too much to wear the sling") On oral clindamycin now WBC still elevated 15.7K today  Phosphate binders restarted yesterday (Renvela w/meals and snacks) Discharge planning and post op care per primary and urology  Jamal Maes, MD [place] Pager 02/29/2016, 7:21 AM

## 2016-02-29 NOTE — Discharge Instructions (Signed)

## 2016-02-29 NOTE — Progress Notes (Addendum)
CM talked to patient about DC home and he requested ambulance transportation; he stated that he has a wheelchair at home and express difficulty walking; Home Address verified and he told me that someone would be there to unlock the door on his arrival; [place] ambulance called for pick up at 5 pm as requested by pt; Aneta Mins [phone]  1448 pm- Received message that his wife was in his room; (patient informed CM that his wife had to be at work at 3pm/ no transportation home); CM talked to spouse at the bedside, pt still wants to be transported via ambulance ( scrotal abscess/ difficulty walking/ wheelchair at home). Patient is agreeable for ambulance to transport him home now. [facility] called. Spouse will follow the ambulance home to unlock the door. Mindi Slicker [facility] [phone]

## 2016-02-29 NOTE — Progress Notes (Signed)
Patient has order to discharge home. Patient requested to be sent home by ambulance. Discharge instruction reviewed with patient and wife at bedside. Both parties verbalized understanding. Demonstrated to wife how to change scrotal dressing. Wife demonstrates ability. IV and tele removed. Patient stable and awaiting transport.

## 2016-02-29 NOTE — Progress Notes (Signed)
[facility] for Warfarin Indication: h/o DVT  No Known Allergies  Patient Measurements: Height: 5\' 11"  (180.3 cm) Weight: 264 lb 5.3 oz (119.9 kg) IBW/kg (Calculated) : 75.3  Vital Signs: Temp: 98.9 F (37.2 C) (01/26 0718) Temp Source: Oral (01/26 0718) BP: 95/69 (01/26 0830) Pulse Rate: 88 (01/26 0830)  Labs:  Recent Labs  02/27/16 0538  02/27/16 0726 02/28/16 0404 02/29/16 0412  HGB  --   < > 9.3* 9.5* 8.7*  HCT  --   --  28.7* 29.5* 26.9*  PLT  --   --  144* 166 182  LABPROT 35.8*  --   --  28.8* 28.9*  INR 3.48  --   --  2.65 2.66  CREATININE  --   --  11.54* 8.44* 12.47*  < > = values in this interval not displayed.  Estimated Creatinine Clearance: 9.5 mL/min (by C-G formula based on SCr of 12.47 mg/dL (H)).   Assessment: 6 YOM with history of DVT on warfarin PTA presents with scrotal pain and swelling. Pharmacy is consulted to dose warfarin for history of DVT. Patient was taken to OR by urology for I&D 02/25/16.   INR today remains therapeutic after resuming PTA dose. INR 2.66 << 2.65, Hgb/Hct slight drop, plts wnl. No bleeding noted.   PTA Warfarin Dose: 2.5mg /d  Goal of Therapy:  INR 2-3 Monitor platelets by anticoagulation protocol: Yes   Plan:  1. Warfarin 2.5 mg x 1 dose at 1800 today 2. Will continue to monitor for any signs/symptoms of bleeding and will follow up with PT/INR in the a.m.   Thank you for allowing pharmacy to be a part of this patient's care.  Alycia Rossetti, PharmD, BCPS Clinical Pharmacist Pager: [phone] Clinical phone for 02/29/2016 from 7a-3:30p: [phone] If after 3:30p, please call main pharmacy at: [phone] 02/29/2016 8:49 AM

## 2016-02-29 NOTE — Discharge Summary (Signed)
Physician Discharge Summary  JATHAN BALLING OIZ:[id number] DOB: 04/26/1965 DOA: 02/25/2016  PCP: Kingsley Callander, FNP  Admit date: 02/25/2016 Discharge date: 02/29/2016  Time spent: 45 minutes  Recommendations for Outpatient Follow-up:  Patient will be discharged to home.  Patient will need to follow up with primary care provider within one week of discharge.  Follow up with Dr. Pilar Jarvis, urology. Continue dressing changes. Patient should continue medications as prescribed.  Patient should follow a renal/carb modified diet.   Discharge Diagnoses:  Principal Problem:   Scrotal abscess Active Problems:   Arthritis, gouty   Hypertension   ESRD on dialysis ([place])   Type 2 diabetes mellitus ([place])   Discharge Condition: Stable  Diet recommendation: Renal/carb modified  Filed Weights   02/28/16 0531 02/29/16 0650 02/29/16 0718  Weight: 119.3 kg (263 lb) 119.1 kg (262 lb 9.6 oz) 119.9 kg (264 lb 5.3 oz)    History of present illness:  on 02/25/2016 by Dr. Dannielle Burn Stephensis a 51 y.o.malewith medical history significant of anemia, osteoarthritis, BPH, chronic back pain, DVT, ESRD on hemodialysis, erectile dysfunction, essential hypertension, gout, diabetic neuropathy, type 2 diabetes who is coming to the emergency department with complaints of pain and swelling on his right scrotal area. He denies fever, but feels chills and fatigue. He denies trauma to the area. He denies dysuria or hematuria.  Hospital Course:  Scrotal Abscess -S/p I&D by Dr. Pilar Jarvis, urology on 02/25/2016 -continue daily dressing changes/packing, antibiotics, and follow up in one month -Initially placed on vanc/zosyn -Continue pain control -leukocytosis improving since admission -Continues to have scrotal edema, have ordered for scrotal support.  Instructed patient on placing a towel underneath his scrotum for elevation.  -Spoke with Dr. Alyson Ingles on 1/25. Recommended clindamycin and outpatient  followup.  Agreed with elevation of the scrotum. Noted that patient may have some darkening/blackening of the skin, recommended that if that occurred to let the skin slough off.   ESRD -dialyzes MWF, missed Monday session -nephrology consulted and appreciated  Diabetes mellitus,type II complicated by hypoglycemia -medications held due to hypoglycemia -Restart home meds on discharge  Gout -Continue uloric, colchicine  History of DVT -Continue coumadin  Consultants Nephrology Urology  Procedures  I&D of scrotal abscess  Discharge Exam: Vitals:   02/29/16 1130 02/29/16 1200  BP: 110/78 119/83  Pulse: (!) 105 (!) 107  Resp: 20 12  Temp:  98.4 F (36.9 C)   Complains of scrotal pain and swelling. Does not feel he or his wife can chang his dressings and would like home health. Denies chest pain, shortness of breath, abdominal pain, N/V/D/C.   Exam  General: Well developed, well nourished, NAD, appears stated age  HEENT: NCAT, mucous membranes moist.   Cardiovascular: S1 S2 auscultated,no murmurs, RRR  Respiratory: Clear to auscultation bilaterally  Abdomen: Soft, obese, nontender, nondistended, + bowel sounds  GU: scrotal edema, dressing in place.   Extremities: warm dry without cyanosis clubbing or edema  Neuro: AAOx3, nonfocal  Psych: Appropriate  Discharge Instructions Discharge Instructions    Discharge instructions    Complete by:  As directed    Patient will be discharged to home.  Patient will need to follow up with primary care provider within one week of discharge.  Follow up with Dr. Pilar Jarvis, urology. Continue dressing changes. Patient should continue medications as prescribed.  Patient should follow a renal/carb modified diet.     Current Discharge Medication List    START taking these medications   Details  clindamycin (CLEOCIN) 300 MG capsule Take 1 capsule (300 mg total) by mouth every 8 (eight) hours. Qty: 30 capsule, Refills: 0    !!  sevelamer carbonate (RENVELA) 800 MG tablet Take 4 tablets (3,200 mg total) by mouth 3 (three) times daily with meals. Qty: 90 tablet, Refills: 0    !! sevelamer carbonate (RENVELA) 800 MG tablet Take 1 tablet (800 mg total) by mouth 2 (two) times daily between meals as needed (With snacks). Qty: 60 tablet, Refills: 0     !! - Potential duplicate medications found. Please discuss with provider.    CONTINUE these medications which have NOT CHANGED   Details  colchicine 0.6 MG tablet 1/2 tab po 2 times /week Qty: 15 tablet, Refills: 0    diclofenac sodium (VOLTAREN) 1 % GEL Apply to left wrist and hand three times daily Qty: 100 g, Refills: 0    gabapentin (NEURONTIN) 100 MG capsule Take 100 mg by mouth daily.     Multiple Vitamin (DAILY VITE PO) Take 1 tablet by mouth daily.    ULORIC 80 MG TABS Take 80 mg by mouth daily.    warfarin (COUMADIN) 2.5 MG tablet Take 2.5 mg by mouth daily. Take 1/2 tablet daily      STOP taking these medications     sildenafil (REVATIO) 20 MG tablet      doxycycline (VIBRA-TABS) 100 MG tablet        No Known Allergies Follow-up Information    Nickie Retort, MD Follow up in 1 month(s).   Specialty:  Urology Why:  scrotal abscess follow up with urology Contact information: [place] [address] [phone]        Kingsley Callander, FNP. Schedule an appointment as soon as possible for a visit in 1 week(s).   Specialty:  Family Medicine Why:  Hospital follow up Contact information: [place] [address] [phone]            The results of significant diagnostics from this hospitalization (including imaging, microbiology, ancillary and laboratory) are listed below for reference.    Significant Diagnostic Studies: Ct Pelvis W Contrast  Result Date: 02/25/2016 CLINICAL DATA:  51 year old diabetic hypertensive male with abscess and testicles for 2 days. Initial encounter. EXAM: CT PELVIS WITH  CONTRAST TECHNIQUE: Multidetector CT imaging of the pelvis was performed using the standard protocol following the bolus administration of intravenous contrast. CONTRAST:  159mL ISOVUE-300 IOPAMIDOL (ISOVUE-300) INJECTION 61% COMPARISON:  11/01/2015 CT. FINDINGS: Urinary Tract: Contracted urinary bladder with circumferential thickening. Slight impression upon the bladder base by prostate gland. Bowel: No extra luminal bowel inflammatory process. Left colon diverticula. Vascular/Lymphatic: Calcification left common iliac artery. Reproductive: Abnormal appearance of the scrotum with diffuse inflammation. Within the scrotum are ill-defined structures which may represent combination of abscess and testicle. No gas however this may represent Fournier's gangrene. Inflammatory process extends to the undersurface of the penis. Inflammation extends along the inferior aspect of the inguinal canal greater on the right. Adjacent reactive adenopathy. Other:  None. Musculoskeletal: Schmorl's node deformity inferior endplate L4 and L5. IMPRESSION: Abnormal appearance of the scrotum with diffuse inflammation. Within the scrotum are ill-defined structures which may represent combination of abscess and testicle. No gas however this may represent Fournier's gangrene. Inflammatory process extends to the undersurface of the penis. Inflammation extends along the inferior aspect of the inguinal canal greater on the right. Adjacent reactive adenopathy. These results were called by telephone at the time of  interpretation on 02/25/2016 at 3:10 pm to Dr. Stark Jock, who verbally acknowledged these results. Electronically Signed   By: Genia Del M.D.   On: 02/25/2016 15:14    Microbiology: Recent Results (from the past 240 hour(s))  Aerobic/Anaerobic Culture (surgical/deep wound)     Status: None (Preliminary result)   Collection Time: 02/25/16  9:00 PM  Result Value Ref Range Status   Specimen Description ABSCESS SCROTUM  Final   Special  Requests NONE  Final   Gram Stain   Final    FEW WBC PRESENT,BOTH PMN AND MONONUCLEAR FEW GRAM POSITIVE COCCI IN PAIRS    Culture   Final    CULTURE REINCUBATED FOR BETTER GROWTH Performed at [facility], [address] [place], [place] [address]    Report Status PENDING  Incomplete  MRSA PCR Screening     Status: None   Collection Time: 02/25/16 11:47 PM  Result Value Ref Range Status   MRSA by PCR NEGATIVE NEGATIVE Final    Comment:        The GeneXpert MRSA Assay (FDA approved for NASAL specimens only), is one component of a comprehensive MRSA colonization surveillance program. It is not intended to diagnose MRSA infection nor to guide or monitor treatment for MRSA infections.      Labs: Basic Metabolic Panel:  Recent Labs Lab 02/26/16 0449 02/26/16 0845 02/27/16 0726 02/28/16 0404 02/29/16 0412  NA 135 133* 133* 135 134*  K 6.5* 6.3* 4.8 4.0 4.7  CL 100* 98* 96* 97* 95*  CO2 24 20* 27 25 27   GLUCOSE 99 91 95 100* 107*  BUN 68* 70* 30* 24* 47*  CREATININE 18.20* 18.28* 11.54* 8.44* 12.47*  CALCIUM 8.8* 8.8* 8.5* 8.9 8.6*  PHOS  --   --  6.6* 6.1* 8.4*   Liver Function Tests:  Recent Labs Lab 02/26/16 0449 02/27/16 0726 02/28/16 0404 02/29/16 0412  AST 22  --   --   --   ALT 19  --   --   --   ALKPHOS 62  --   --   --   BILITOT 0.8  --   --   --   PROT 7.4  --   --   --   ALBUMIN 3.0* 2.6* 2.6* 2.2*   No results for input(s): LIPASE, AMYLASE in the last 168 hours. No results for input(s): AMMONIA in the last 168 hours. CBC:  Recent Labs Lab 02/25/16 1046 02/26/16 0449 02/27/16 0726 02/28/16 0404 02/29/16 0412  WBC 20.4* 20.1* 16.5* 13.7* 15.7*  NEUTROABS 17.3* 17.6*  --   --   --   HGB 10.8* 10.3* 9.3* 9.5* 8.7*  HCT 32.8* 31.9* 28.7* 29.5* 26.9*  MCV 89.6 89.4 88.6 89.1 87.9  PLT 143* 141* 144* 166 182   Cardiac Enzymes: No results for input(s): CKTOTAL, CKMB, CKMBINDEX, TROPONINI in the last 168 hours. BNP: BNP (last 3  results) No results for input(s): BNP in the last 8760 hours.  ProBNP (last 3 results) No results for input(s): PROBNP in the last 8760 hours.  CBG:  Recent Labs Lab 02/28/16 0623 02/28/16 1122 02/28/16 1638 02/28/16 2134 02/29/16 0644  GLUCAP 109* 96 84 114* 111*       Signed:  Syenna Nazir  [facility] 02/29/2016, 12:56 PM

## 2016-02-29 NOTE — Progress Notes (Signed)
Pt's B/P is 93/63. He requested for Morphine to be held, no pain meds given during the night, pt stated he felt good this morning,although he didn't get any pain meds.

## 2016-03-03 ENCOUNTER — Encounter (HOSPITAL_COMMUNITY): Payer: Self-pay | Admitting: Emergency Medicine

## 2016-03-03 ENCOUNTER — Encounter: Payer: Self-pay | Admitting: Vascular Surgery

## 2016-03-03 ENCOUNTER — Inpatient Hospital Stay (HOSPITAL_COMMUNITY)
Admission: EM | Admit: 2016-03-03 | Discharge: 2016-03-13 | DRG: 919 | Disposition: A | Discharge status: Home / Self Care | Payer: Medicare Other | Attending: Internal Medicine | Admitting: Internal Medicine

## 2016-03-03 DIAGNOSIS — N5089 Other specified disorders of the male genital organs: Secondary | ICD-10-CM | POA: Diagnosis present

## 2016-03-03 DIAGNOSIS — I4510 Unspecified right bundle-branch block: Secondary | ICD-10-CM | POA: Diagnosis present

## 2016-03-03 DIAGNOSIS — E1122 Type 2 diabetes mellitus with diabetic chronic kidney disease: Secondary | ICD-10-CM | POA: Diagnosis present

## 2016-03-03 DIAGNOSIS — M109 Gout, unspecified: Secondary | ICD-10-CM | POA: Diagnosis present

## 2016-03-03 DIAGNOSIS — Z79899 Other long term (current) drug therapy: Secondary | ICD-10-CM

## 2016-03-03 DIAGNOSIS — I429 Cardiomyopathy, unspecified: Secondary | ICD-10-CM | POA: Diagnosis present

## 2016-03-03 DIAGNOSIS — N186 End stage renal disease: Secondary | ICD-10-CM | POA: Diagnosis not present

## 2016-03-03 DIAGNOSIS — T148XXA Other injury of unspecified body region, initial encounter: Secondary | ICD-10-CM | POA: Diagnosis present

## 2016-03-03 DIAGNOSIS — R29898 Other symptoms and signs involving the musculoskeletal system: Secondary | ICD-10-CM | POA: Diagnosis present

## 2016-03-03 DIAGNOSIS — N529 Male erectile dysfunction, unspecified: Secondary | ICD-10-CM | POA: Diagnosis present

## 2016-03-03 DIAGNOSIS — N189 Chronic kidney disease, unspecified: Secondary | ICD-10-CM

## 2016-03-03 DIAGNOSIS — I120 Hypertensive chronic kidney disease with stage 5 chronic kidney disease or end stage renal disease: Secondary | ICD-10-CM | POA: Diagnosis not present

## 2016-03-03 DIAGNOSIS — Z6835 Body mass index (BMI) 35.0-35.9, adult: Secondary | ICD-10-CM

## 2016-03-03 DIAGNOSIS — E1140 Type 2 diabetes mellitus with diabetic neuropathy, unspecified: Secondary | ICD-10-CM | POA: Diagnosis not present

## 2016-03-03 DIAGNOSIS — Z992 Dependence on renal dialysis: Secondary | ICD-10-CM

## 2016-03-03 DIAGNOSIS — E119 Type 2 diabetes mellitus without complications: Secondary | ICD-10-CM

## 2016-03-03 DIAGNOSIS — I10 Essential (primary) hypertension: Secondary | ICD-10-CM | POA: Diagnosis present

## 2016-03-03 DIAGNOSIS — M6281 Muscle weakness (generalized): Secondary | ICD-10-CM

## 2016-03-03 DIAGNOSIS — E669 Obesity, unspecified: Secondary | ICD-10-CM | POA: Diagnosis present

## 2016-03-03 DIAGNOSIS — N400 Enlarged prostate without lower urinary tract symptoms: Secondary | ICD-10-CM | POA: Diagnosis present

## 2016-03-03 DIAGNOSIS — I872 Venous insufficiency (chronic) (peripheral): Secondary | ICD-10-CM | POA: Diagnosis present

## 2016-03-03 DIAGNOSIS — Z86718 Personal history of other venous thrombosis and embolism: Secondary | ICD-10-CM

## 2016-03-03 DIAGNOSIS — E875 Hyperkalemia: Secondary | ICD-10-CM | POA: Diagnosis present

## 2016-03-03 DIAGNOSIS — N492 Inflammatory disorders of scrotum: Secondary | ICD-10-CM | POA: Diagnosis present

## 2016-03-03 DIAGNOSIS — K5900 Constipation, unspecified: Secondary | ICD-10-CM

## 2016-03-03 DIAGNOSIS — R791 Abnormal coagulation profile: Secondary | ICD-10-CM | POA: Diagnosis present

## 2016-03-03 DIAGNOSIS — T8189XA Other complications of procedures, not elsewhere classified, initial encounter: Secondary | ICD-10-CM | POA: Diagnosis not present

## 2016-03-03 DIAGNOSIS — T8131XA Disruption of external operation (surgical) wound, not elsewhere classified, initial encounter: Secondary | ICD-10-CM | POA: Diagnosis not present

## 2016-03-03 DIAGNOSIS — Z7901 Long term (current) use of anticoagulants: Secondary | ICD-10-CM

## 2016-03-03 DIAGNOSIS — E1151 Type 2 diabetes mellitus with diabetic peripheral angiopathy without gangrene: Secondary | ICD-10-CM | POA: Diagnosis present

## 2016-03-03 DIAGNOSIS — N99821 Postprocedural hemorrhage and hematoma of a genitourinary system organ or structure following other procedure: Principal | ICD-10-CM | POA: Diagnosis present

## 2016-03-03 DIAGNOSIS — D72829 Elevated white blood cell count, unspecified: Secondary | ICD-10-CM | POA: Diagnosis present

## 2016-03-03 DIAGNOSIS — E871 Hypo-osmolality and hyponatremia: Secondary | ICD-10-CM | POA: Diagnosis present

## 2016-03-03 DIAGNOSIS — D62 Acute posthemorrhagic anemia: Secondary | ICD-10-CM | POA: Diagnosis present

## 2016-03-03 DIAGNOSIS — D631 Anemia in chronic kidney disease: Secondary | ICD-10-CM | POA: Diagnosis present

## 2016-03-03 LAB — GLUCOSE, CAPILLARY
GLUCOSE-CAPILLARY: 114 mg/dL — AB (ref 65–99)
Glucose-Capillary: 68 mg/dL (ref 65–99)

## 2016-03-03 LAB — BASIC METABOLIC PANEL
ANION GAP: 18 — AB (ref 5–15)
BUN: 98 mg/dL — ABNORMAL HIGH (ref 6–20)
CALCIUM: 9 mg/dL (ref 8.9–10.3)
CO2: 22 mmol/L (ref 22–32)
Chloride: 93 mmol/L — ABNORMAL LOW (ref 101–111)
Creatinine, Ser: 15.5 mg/dL — ABNORMAL HIGH (ref 0.61–1.24)
GFR calc Af Amer: 4 mL/min — ABNORMAL LOW (ref >60)
GFR, EST NON AFRICAN AMERICAN: 3 mL/min — AB (ref >60)
GLUCOSE: 77 mg/dL (ref 65–99)
Potassium: 4.9 mmol/L (ref 3.5–5.1)
Sodium: 133 mmol/L — ABNORMAL LOW (ref 135–145)

## 2016-03-03 LAB — CBC
HCT: 29.2 % — ABNORMAL LOW (ref 39.0–52.0)
HEMOGLOBIN: 9.6 g/dL — AB (ref 13.0–17.0)
MCH: 28.9 pg (ref 26.0–34.0)
MCHC: 32.9 g/dL (ref 30.0–36.0)
MCV: 88 fL (ref 78.0–100.0)
Platelets: 270 10*3/uL (ref 150–400)
RBC: 3.32 MIL/uL — ABNORMAL LOW (ref 4.22–5.81)
RDW: 16.2 % — ABNORMAL HIGH (ref 11.5–15.5)
WBC: 12.5 10*3/uL — ABNORMAL HIGH (ref 4.0–10.5)

## 2016-03-03 LAB — PROTIME-INR
INR: 3.47
PROTHROMBIN TIME: 35.7 s — AB (ref 11.4–15.2)

## 2016-03-03 MED ORDER — NEPRO/CARBSTEADY PO LIQD: 237.0000 mL | Freq: Three times a day (TID) | ORAL | Status: DC | PRN

## 2016-03-03 MED ORDER — ACETAMINOPHEN 325 MG PO TABS: 650.0000 mg | ORAL_TABLET | Freq: Four times a day (QID) | ORAL | Status: DC | PRN

## 2016-03-03 MED ORDER — HYDROXYZINE HCL 25 MG PO TABS: 25.0000 mg | ORAL_TABLET | Freq: Three times a day (TID) | ORAL | Status: DC | PRN

## 2016-03-03 MED ORDER — ONDANSETRON HCL 4 MG/2ML IJ SOLN
4.0000 mg | Freq: Four times a day (QID) | INTRAMUSCULAR | Status: DC | PRN
  Administered 2016-03-05 – 2016-03-10 (×3): 4 mg via INTRAVENOUS
  Filled 2016-03-03 (×3): qty 2

## 2016-03-03 MED ORDER — DOCUSATE SODIUM 100 MG PO CAPS
100.0000 mg | ORAL_CAPSULE | Freq: Two times a day (BID) | ORAL | Status: DC
  Administered 2016-03-03 – 2016-03-04 (×3): 100 mg via ORAL
  Filled 2016-03-03 (×3): qty 1

## 2016-03-03 MED ORDER — POLYETHYLENE GLYCOL 3350 17 G PO PACK: 17.0000 g | PACK | Freq: Every day | ORAL | Status: DC | PRN

## 2016-03-03 MED ORDER — FEBUXOSTAT 40 MG PO TABS
80.0000 mg | ORAL_TABLET | Freq: Every day | ORAL | Status: DC
  Administered 2016-03-04 – 2016-03-13 (×8): 80 mg via ORAL
  Filled 2016-03-03: qty 1
  Filled 2016-03-03 (×4): qty 2
  Filled 2016-03-03: qty 1
  Filled 2016-03-03 (×5): qty 2

## 2016-03-03 MED ORDER — ONDANSETRON HCL 4 MG PO TABS
4.0000 mg | ORAL_TABLET | Freq: Four times a day (QID) | ORAL | Status: DC | PRN
  Administered 2016-03-11: 4 mg via ORAL
  Filled 2016-03-03: qty 1

## 2016-03-03 MED ORDER — CAMPHOR-MENTHOL 0.5-0.5 % EX LOTN: 1.0000 "application " | TOPICAL_LOTION | Freq: Three times a day (TID) | CUTANEOUS | Status: DC | PRN

## 2016-03-03 MED ORDER — CALCIUM CARBONATE ANTACID 1250 MG/5ML PO SUSP
500.0000 mg | Freq: Four times a day (QID) | ORAL | Status: DC | PRN
  Filled 2016-03-03: qty 5

## 2016-03-03 MED ORDER — TRAMADOL HCL 50 MG PO TABS
50.0000 mg | ORAL_TABLET | Freq: Every day | ORAL | Status: DC | PRN
  Administered 2016-03-04 – 2016-03-13 (×8): 50 mg via ORAL
  Filled 2016-03-03 (×9): qty 1

## 2016-03-03 MED ORDER — TRAMADOL HCL 50 MG PO TABS
50.0000 mg | ORAL_TABLET | Freq: Once | ORAL | Status: AC
  Administered 2016-03-03: 50 mg via ORAL
  Filled 2016-03-03: qty 1

## 2016-03-03 MED ORDER — SORBITOL 70 % SOLN: 30.0000 mL | Status: DC | PRN

## 2016-03-03 MED ORDER — DOCUSATE SODIUM 283 MG RE ENEM: 1.0000 | ENEMA | RECTAL | Status: DC | PRN

## 2016-03-03 MED ORDER — ACETAMINOPHEN 650 MG RE SUPP: 650.0000 mg | Freq: Four times a day (QID) | RECTAL | Status: DC | PRN

## 2016-03-03 MED ORDER — SEVELAMER CARBONATE 800 MG PO TABS
800.0000 mg | ORAL_TABLET | Freq: Two times a day (BID) | ORAL | Status: DC | PRN
Start: 2016-03-03 — End: 2016-03-10

## 2016-03-03 MED ORDER — SEVELAMER CARBONATE 800 MG PO TABS
3200.0000 mg | ORAL_TABLET | Freq: Three times a day (TID) | ORAL | Status: DC
  Administered 2016-03-04 – 2016-03-13 (×15): 3200 mg via ORAL
  Filled 2016-03-03 (×21): qty 4

## 2016-03-03 MED ORDER — CLINDAMYCIN HCL 150 MG PO CAPS
300.0000 mg | ORAL_CAPSULE | Freq: Three times a day (TID) | ORAL | Status: DC
  Administered 2016-03-03 – 2016-03-08 (×12): 300 mg via ORAL
  Filled 2016-03-03 (×12): qty 2

## 2016-03-03 MED ORDER — GABAPENTIN 100 MG PO CAPS
100.0000 mg | ORAL_CAPSULE | Freq: Every day | ORAL | Status: DC
  Administered 2016-03-04 – 2016-03-13 (×10): 100 mg via ORAL
  Filled 2016-03-03 (×9): qty 1

## 2016-03-03 MED ORDER — INSULIN ASPART 100 UNIT/ML ~~LOC~~ SOLN
0.0000 [IU] | Freq: Three times a day (TID) | SUBCUTANEOUS | Status: DC
  Administered 2016-03-10: 2 [IU] via SUBCUTANEOUS

## 2016-03-03 NOTE — H&P (Signed)
History and Physical    Jack Irwin CNO:[id number] DOB: 11-18-1965 DOA: 03/03/2016  PCP: Kingsley Callander, FNP Consultants:  Lowanda Foster - nephrology; Fields - GI; Pilar Jarvis - urology; cardiology - ?[place] Patient coming from: home - lives with wife; NOK: wife, [phone]  Chief Complaint: scrotal swelling, bleeding  HPI: Jack Irwin is a 51 y.o. male with medical history significant of ESRD on hemodialysis, anemia of CKD, chronic back pain, DVT on anticoagulation, HTN, gout, and DM presenting with persistent scrotal swelling after I&D.  He was hospitalized at [facility] from 1/22-26 for scrotal abscess and was discharged on Clindamycin with daily dressing changes/packing.  Has had ongoing swelling.  Today he was sleeping and he felt something wet, it had burst open and was bleeding.  He is on Coumadin for prior DVT.  Bled "a whole lot" - wet the whole bed, mattress, cover.  Not light-headed/dizzy.  Scheduled for dialysis today but he was hurting and swiollen so didn't go.  No BM since last Saturday.  +constipation.  ED Course: Per Dr. Jeanell Sparrow: 1- post op bleeding- packing in place and bleeding controlled after pressure applied. Elevated inr contributing  2- elevated inr- hold coumadin vs vit k  3- abscess with packing at scrotum- discussed with Dr. Diona Fanti - he will see in am and remove packing, continue home antibiotics, he does not think imaging indicated at this time.  4- renal failure- on dialysis- will need dialysis   Review of Systems: As per HPI; otherwise 10 point review of systems reviewed and negative.   Ambulatory Status:  ambulates without difficulty but has not been mobile since surgery due to swelling  Past Medical History:  Diagnosis Date  . Anemia   . Arthritis   . BPH (benign prostatic hyperplasia)   . Chronic back pain   . Difficulty walking   . DVT (deep venous thrombosis) ([place])    Right popliteal DVT December 2017  . End-stage renal disease on hemodialysis ([place])     Dr. Lowanda Foster  . Erectile dysfunction   . Essential hypertension   . Gout   . Neuropathy, diabetic ([place])   . Type 2 diabetes mellitus ([place])   . Venous (peripheral) insufficiency     Past Surgical History:  Procedure Laterality Date  . AV FISTULA PLACEMENT Left 03/09/2014   Procedure: INSERTION OF ARTERIOVENOUS (AV) GORE-TEX GRAFT ARM;  Surgeon: Angelia Mould, MD;  Location: [facility];  Service: Vascular;  Laterality: Left;  . CYST EXCISION Right    cyst removed on thumb 2001  . INSERTION OF DIALYSIS CATHETER    . INSERTION OF DIALYSIS CATHETER Left 03/09/2014   Procedure: INSERTION OF DIALYSIS CATHETER;  Surgeon: Angelia Mould, MD;  Location: [facility];  Service: Vascular;  Laterality: Left;  . IRRIGATION AND DEBRIDEMENT ABSCESS N/A 02/25/2016   Procedure: IRRIGATION AND DEBRIDEMENT SCROTAL ABSCESS;  Surgeon: Nickie Retort, MD;  Location: [facility];  Service: Urology;  Laterality: N/A;    Social History   Social History  . Marital status: Married    Spouse name: N/A  . Number of children: N/A  . Years of education: N/A   Occupational History  . Not on file.   Social History Main Topics  . Smoking status: Never Smoker  . Smokeless tobacco: Never Used  . Alcohol use No  . Drug use: No  . Sexual activity: Not on file   Other Topics Concern  . Not on file   Social History Narrative  . No  narrative on file    No Known Allergies  Family History  Problem Relation Age of Onset  . Diabetes Mother   . Hypertension Mother   . Heart failure Mother   . Hyperlipidemia Mother   . Heart attack Mother   . Cancer Father   . Diabetes Father   . Hypertension Father   . Hyperlipidemia Father     Prior to Admission medications   Medication Sig Start Date End Date Taking? Authorizing Provider  clindamycin (CLEOCIN) 300 MG capsule Take 1 capsule (300 mg total) by mouth every 8 (eight) hours. 02/29/16   Maryann Mikhail, DO  colchicine 0.6 MG tablet 1/2 tab po 2 times  /week Patient taking differently: Take 0.3 mg by mouth daily as needed (for gout).  08/15/15   Lily Kocher, PA-C  diclofenac sodium (VOLTAREN) 1 % GEL Apply to left wrist and hand three times daily 04/13/14   Lily Kocher, PA-C  gabapentin (NEURONTIN) 100 MG capsule Take 100 mg by mouth daily.  07/11/14   Historical Provider, MD  Multiple Vitamin (DAILY VITE PO) Take 1 tablet by mouth daily.    Historical Provider, MD  sevelamer carbonate (RENVELA) 800 MG tablet Take 4 tablets (3,200 mg total) by mouth 3 (three) times daily with meals. 02/29/16   Maryann Mikhail, DO  sevelamer carbonate (RENVELA) 800 MG tablet Take 1 tablet (800 mg total) by mouth 2 (two) times daily between meals as needed (With snacks). 02/29/16   Maryann Mikhail, DO  traMADol (ULTRAM) 50 MG tablet Take 1 tablet (50 mg total) by mouth daily as needed (with dressing changes). 02/29/16   Maryann Mikhail, DO  ULORIC 80 MG TABS Take 80 mg by mouth daily. 09/26/14   Historical Provider, MD  warfarin (COUMADIN) 2.5 MG tablet Take 2.5 mg by mouth daily. Take 1/2 tablet daily    Historical Provider, MD    Physical Exam: Vitals:   03/03/16 1704 03/03/16 1730 03/03/16 1930 03/03/16 2151  BP: 130/86 (!) 150/106 (!) 136/102 133/89  Pulse: 102 90 93 87  Resp: 22  16 16   Temp:    97.5 F (36.4 C)  TempSrc:    Oral  SpO2: 98% 98% 98% 97%  Weight:    118.9 kg (262 lb 2 oz)  Height:    5\' 11"  (1.803 m)     General:  Appears calm and comfortable and is NAD Eyes:  PERRL, EOMI, normal lids, iris ENT:  grossly normal hearing, lips & tongue, mmm Neck:  no LAD, masses or thyromegaly Cardiovascular:  RRR, no m/r/g. No LE edema.  Respiratory:  CTA bilaterally, no w/r/r. Normal respiratory effort. Abdomen:  soft, TTP due to constipation, nd, NABS Skin:  no rash or induration seen on limited exam GU: Marked scrotal edema with bright red fresh and clotted blood in abundance underneath scrotum, packing present with a clot adjacent to the  packing Musculoskeletal:  grossly normal tone BUE/BLE, good ROM, no bony abnormality Psychiatric:  grossly normal mood and affect, speech fluent and appropriate, AOx3 Neurologic:  CN 2-12 grossly intact, moves all extremities in coordinated fashion, sensation intact  Labs on Admission: I have personally reviewed following labs and imaging studies  CBC:  Recent Labs Lab 02/26/16 0449 02/27/16 0726 02/28/16 0404 02/29/16 0412 03/03/16 1426  WBC 20.1* 16.5* 13.7* 15.7* 12.5*  NEUTROABS 17.6*  --   --   --   --   HGB 10.3* 9.3* 9.5* 8.7* 9.6*  HCT 31.9* 28.7* 29.5* 26.9* 29.2*  MCV  89.4 88.6 89.1 87.9 88.0  PLT 141* 144* 166 182 734   Basic Metabolic Panel:  Recent Labs Lab 02/26/16 0845 02/27/16 0726 02/28/16 0404 02/29/16 0412 03/03/16 1426  NA 133* 133* 135 134* 133*  K 6.3* 4.8 4.0 4.7 4.9  CL 98* 96* 97* 95* 93*  CO2 20* 27 25 27 22   GLUCOSE 91 95 100* 107* 77  BUN 70* 30* 24* 47* 98*  CREATININE 18.28* 11.54* 8.44* 12.47* 15.50*  CALCIUM 8.8* 8.5* 8.9 8.6* 9.0  PHOS  --  6.6* 6.1* 8.4*  --    GFR: Estimated Creatinine Clearance: 7.6 mL/min (by C-G formula based on SCr of 15.5 mg/dL (H)). Liver Function Tests:  Recent Labs Lab 02/26/16 0449 02/27/16 0726 02/28/16 0404 02/29/16 0412  AST 22  --   --   --   ALT 19  --   --   --   ALKPHOS 62  --   --   --   BILITOT 0.8  --   --   --   PROT 7.4  --   --   --   ALBUMIN 3.0* 2.6* 2.6* 2.2*   No results for input(s): LIPASE, AMYLASE in the last 168 hours. No results for input(s): AMMONIA in the last 168 hours. Coagulation Profile:  Recent Labs Lab 02/26/16 0449 02/27/16 0538 02/28/16 0404 02/29/16 0412 03/03/16 1426  INR 3.20 3.48 2.65 2.66 3.47   Cardiac Enzymes: No results for input(s): CKTOTAL, CKMB, CKMBINDEX, TROPONINI in the last 168 hours. BNP (last 3 results) No results for input(s): PROBNP in the last 8760 hours. HbA1C: No results for input(s): HGBA1C in the last 72  hours. CBG:  Recent Labs Lab 02/28/16 1122 02/28/16 1638 02/28/16 2134 02/29/16 0644 03/03/16 2157  GLUCAP 96 84 114* 111* 68   Lipid Profile: No results for input(s): CHOL, HDL, LDLCALC, TRIG, CHOLHDL, LDLDIRECT in the last 72 hours. Thyroid Function Tests: No results for input(s): TSH, T4TOTAL, FREET4, T3FREE, THYROIDAB in the last 72 hours. Anemia Panel: No results for input(s): VITAMINB12, FOLATE, FERRITIN, TIBC, IRON, RETICCTPCT in the last 72 hours. Urine analysis:    Component Value Date/Time   COLORURINE YELLOW 08/28/2014 1728   APPEARANCEUR CLEAR 08/28/2014 1728   LABSPEC 1.020 08/28/2014 1728   PHURINE 5.5 08/28/2014 1728   GLUCOSEU NEGATIVE 08/28/2014 1728   HGBUR SMALL (A) 08/28/2014 1728   BILIRUBINUR NEGATIVE 08/28/2014 1728   KETONESUR NEGATIVE 08/28/2014 1728   PROTEINUR TRACE (A) 08/28/2014 1728   UROBILINOGEN 0.2 08/28/2014 1728   NITRITE NEGATIVE 08/28/2014 1728   LEUKOCYTESUR TRACE (A) 08/28/2014 1728    Creatinine Clearance: Estimated Creatinine Clearance: 7.6 mL/min (by C-G formula based on SCr of 15.5 mg/dL (H)).  Sepsis Labs: @LABRCNTIP (procalcitonin:4,lacticidven:4) ) Recent Results (from the past 240 hour(s))  Aerobic/Anaerobic Culture (surgical/deep wound)     Status: None (Preliminary result)   Collection Time: 02/25/16  9:00 PM  Result Value Ref Range Status   Specimen Description ABSCESS SCROTUM  Final   Special Requests NONE  Final   Gram Stain   Final    FEW WBC PRESENT,BOTH PMN AND MONONUCLEAR FEW GRAM POSITIVE COCCI IN PAIRS    Culture   Final    MODERATE VIRIDANS STREPTOCOCCUS HOLDING FOR POSSIBLE ANAEROBE Performed at [facility], [place] [address] [place], [place] [address]    Report Status PENDING  Incomplete  MRSA PCR Screening     Status: None   Collection Time: 02/25/16 11:47 PM  Result Value Ref Range  Status   MRSA by PCR NEGATIVE NEGATIVE Final    Comment:        The GeneXpert MRSA Assay (FDA approved for  NASAL specimens only), is one component of a comprehensive MRSA colonization surveillance program. It is not intended to diagnose MRSA infection nor to guide or monitor treatment for MRSA infections.      Radiological Exams on Admission: No results found.  EKG: Not done  Assessment/Plan Principal Problem:   Bleeding from wound Active Problems:   Hypertension   ESRD on dialysis [facility])   Scrotal abscess   Type 2 diabetes mellitus (H[facility])   Chronic anticoagulation   Bleeding scrotal wound -Patient with prior scrotal abscess requiring I&D -Marked ongoing edema -Area of I&D with daily dressing changes -Now with supratherapeutic INR, this area is bleeding -Bleeding was extensive but appears to be relatively controlled so INR reversal does not seem warranted at this time -Will attempt to place pressure dressing in place for now -Continue oral Clindamycin -Urology to see patient in AM -Apply ice as needed for edema -NPO after midnight in case a procedure is needed  ESRD -Patient on MWF HD -Did not feel up to attending HD today -Dr. Jeanell Sparrow spoke with Dr. Lowanda Foster to request consult for HD -Continue Sevelamer -Nephrology prn order set utilized  H/o DVT on anticoagulation -INR 3.47 -Hold Coumadin for now -Repeat INR in AM -Will not reverse with vitamin K at this time, but if bleeding worsens then this will have to be considered  DM -Appears to be diet controlled at this time -Cover with SSI  HTN -Does not appear to be taking medications for this at this time -Consider hydralazine prn if needed (not needed at this time)  DVT prophylaxis: SCDs Code Status:  Full - confirmed with patient Family Communication: None present Disposition Plan:  Home once clinically improved Consults called: Urology, nephrology  Admission status: It is my clinical opinion that referral for OBSERVATION is reasonable and necessary in this patient based on the above information provided. The  aforementioned taken together are felt to place the patient at high risk for further clinical deterioration. However it is anticipated that the patient may be medically stable for discharge from the hospital within 24 to 48 hours.    Karmen Bongo MD [facility]  If 7PM-7AM, please contact night-coverage [email or web address] Password [email or web address]  03/03/2016, 10:15 PM

## 2016-03-03 NOTE — ED Provider Notes (Signed)
[facility] DEPT Provider Note   CSN: [id number] Arrival date & time: 03/03/16  1325   By signing my name below, I, Jack Irwin, attest that this documentation has been prepared under the direction and in the presence of Jack Boss, MD. Electronically Signed: Hilbert Irwin, Scribe. 03/03/16. 2:19 PM. History   Chief Complaint Chief Complaint  Patient presents with  . Groin Pain  . Wound Check      The history is provided by the patient and a relative. No language interpreter was used.   HPI Comments: Jack Irwin is a 51 y.o. male brought in by ambulance, who presents to the Emergency Department for a wound check since being released from [facility] 3 days ago. Patient had an abscess I and D 1/22 by Dr. Pilar Jarvis at that time. He also reports associated groin pain which he has taken tramadol. Today the patient noticed that his wound began to bleed so he decided to come to the ED. He states that he is unable to go to this normal dialysis today because they refused to pick him up in the condition that he is in.  States unable to get up and unable to sit in chair at dialysis Patient reports that he is currently on Coumadin for a blood clot in his leg. Past Medical History:  Diagnosis Date  . Anemia   . Arthritis   . BPH (benign prostatic hyperplasia)   . Chronic back pain   . Difficulty walking   . DVT (deep venous thrombosis) ([place])    Right popliteal DVT December 2017  . End-stage renal disease on hemodialysis ([place])    Dr. Lowanda Foster  . Erectile dysfunction   . Essential hypertension   . Gout   . Neuropathy, diabetic ([place])   . Type 2 diabetes mellitus ([place])   . Venous (peripheral) insufficiency     Patient Active Problem List   Diagnosis Date Noted  . Scrotal abscess 02/25/2016  . Type 2 diabetes mellitus ([place]) 02/25/2016  . Acute venous embolism and thrombosis of deep vessels of proximal lower extremity ([place]) [I82.4Y9] 02/11/2016  . Encounter for therapeutic  drug monitoring 02/11/2016  . Nausea & vomiting 11/17/2014  . Uremia syndrome 11/17/2014  . Uremia 11/17/2014  . Thrombocytopenia ([place]) 02/21/2014  . Morbid obesity ([place]) 03/30/2013  . RBBB 03/29/2013  . Hyperkalemia 03/28/2013  . Leukocytosis 03/28/2013  . Nausea vomiting and diarrhea 03/28/2013  . BBB (bundle branch block) 03/28/2013  . Hypertension   . ESRD on dialysis ([place])   . Lack of coordination 04/15/2012  . Muscle weakness (generalized) 04/15/2012  . Arthritis, gouty 04/15/2012  . Difficulty in walking(719.7) 08/18/2011  . Weakness of both legs 08/18/2011  . Poor balance 08/18/2011    Past Surgical History:  Procedure Laterality Date  . AV FISTULA PLACEMENT Left 03/09/2014   Procedure: INSERTION OF ARTERIOVENOUS (AV) GORE-TEX GRAFT ARM;  Surgeon: Angelia Mould, MD;  Location: [facility];  Service: Vascular;  Laterality: Left;  . CYST EXCISION Right    cyst removed on thumb 2001  . INSERTION OF DIALYSIS CATHETER    . INSERTION OF DIALYSIS CATHETER Left 03/09/2014   Procedure: INSERTION OF DIALYSIS CATHETER;  Surgeon: Angelia Mould, MD;  Location: [facility];  Service: Vascular;  Laterality: Left;  . IRRIGATION AND DEBRIDEMENT ABSCESS N/A 02/25/2016   Procedure: IRRIGATION AND DEBRIDEMENT SCROTAL ABSCESS;  Surgeon: Nickie Retort, MD;  Location: [facility];  Service: Urology;  Laterality: N/A;  Home Medications    Prior to Admission medications   Medication Sig Start Date End Date Taking? Authorizing Provider  clindamycin (CLEOCIN) 300 MG capsule Take 1 capsule (300 mg total) by mouth every 8 (eight) hours. 02/29/16   Maryann Mikhail, DO  colchicine 0.6 MG tablet 1/2 tab po 2 times /week Patient taking differently: Take 0.3 mg by mouth daily as needed (for gout).  08/15/15   Lily Kocher, PA-C  diclofenac sodium (VOLTAREN) 1 % GEL Apply to left wrist and hand three times daily 04/13/14   Lily Kocher, PA-C  gabapentin (NEURONTIN) 100 MG capsule Take 100 mg by  mouth daily.  07/11/14   Historical Provider, MD  Multiple Vitamin (DAILY VITE PO) Take 1 tablet by mouth daily.    Historical Provider, MD  sevelamer carbonate (RENVELA) 800 MG tablet Take 4 tablets (3,200 mg total) by mouth 3 (three) times daily with meals. 02/29/16   Maryann Mikhail, DO  sevelamer carbonate (RENVELA) 800 MG tablet Take 1 tablet (800 mg total) by mouth 2 (two) times daily between meals as needed (With snacks). 02/29/16   Maryann Mikhail, DO  traMADol (ULTRAM) 50 MG tablet Take 1 tablet (50 mg total) by mouth daily as needed (with dressing changes). 02/29/16   Maryann Mikhail, DO  ULORIC 80 MG TABS Take 80 mg by mouth daily. 09/26/14   Historical Provider, MD  warfarin (COUMADIN) 2.5 MG tablet Take 2.5 mg by mouth daily. Take 1/2 tablet daily    Historical Provider, MD    Family History Family History  Problem Relation Age of Onset  . Diabetes Mother   . Hypertension Mother   . Heart failure Mother   . Hyperlipidemia Mother   . Heart attack Mother   . Cancer Father   . Diabetes Father   . Hypertension Father   . Hyperlipidemia Father     Social History Social History  Substance Use Topics  . Smoking status: Never Smoker  . Smokeless tobacco: Never Used  . Alcohol use No     Allergies   Patient has no known allergies.   Review of Systems Review of Systems  Constitutional: Negative for fever.  Cardiovascular: Negative for chest pain.  Gastrointestinal: Negative for abdominal pain and nausea.  Genitourinary:       Groin pain  All other systems reviewed and are negative.    Physical Exam Updated Vital Signs BP 154/94 (BP Location: Right Arm)   Pulse 91   Temp 97.7 F (36.5 C) (Oral)   Resp 18   Ht 5\' 11"  (1.803 m)   Wt 263 lb (119.3 kg)   SpO2 100%   BMI 36.68 kg/m   Physical Exam  Constitutional: He is oriented to person, place, and time. He appears well-developed and well-nourished.  HENT:  Head: Normocephalic.  Eyes: EOM are normal.  Neck:  Normal range of motion.  Pulmonary/Chest: Effort normal.  Abdominal: He exhibits no distension.  Genitourinary: Penis normal.  Genitourinary Comments: Packing in place right scrotum, scrotum diffusely swollen, indurated, and ttp Clot present behind packing on right.   Musculoskeletal: Normal range of motion.  Neurological: He is alert and oriented to person, place, and time.  Skin: Skin is warm. Capillary refill takes less than 2 seconds.  Psychiatric: He has a normal mood and affect.  Nursing note and vitals reviewed.    ED Treatments / Results  DIAGNOSTIC STUDIES: Oxygen Saturation is 100% on RA, normal by my interpretation.    COORDINATION OF CARE: 2:05 PM  Discussed treatment plan with pt at bedside and pt agreed to plan.  Labs (all labs ordered are listed, but only abnormal results are displayed) Labs Reviewed  CBC  PROTIME-INR  BASIC METABOLIC PANEL    EKG  EKG Interpretation None       Radiology No results found.  Procedures Procedures (including critical care time)  Medications Ordered in ED Medications - No data to display   Initial Impression / Assessment and Plan / ED Course  I have reviewed the triage vital signs and the nursing notes.  Pertinent labs & imaging results that were available during my care of the patient were reviewed by me and considered in my medical decision making (see chart for details).   1- post op bleeding- packing in place and bleeding controlled after pressure applied. Elevated inr contributing  2- elevated inr- hold coumadin vs vit k  3- abscess with packing at scrotum- discussed with Dr. Diona Fanti - he will see in am and remove packing, continue home antibiotics, he does not think imaging indicated at this time.  4- renal failure- on dialysis- will need dialysis   I personally performed the services described in this documentation, which was scribed in my presence. The recorded information has been reviewed and  considered.    Jack Boss, MD 03/05/16 [phone]

## 2016-03-03 NOTE — ED Triage Notes (Signed)
Patient states he was released from [facility] hospital Friday due to infection from abscess removal. States today wound started bleeding. Patient states he takes Coumadin and was placed back on it Tuesday.  Bleeding under control.

## 2016-03-03 NOTE — ED Notes (Signed)
Gave patient warm blanket and ginger ale as requested and approved by Dr Jeanell Sparrow.

## 2016-03-03 NOTE — Clinical Social Work Note (Signed)
CSW received consult as pt states he cannot sit for transportation or dialysis. CSW met with pt and pt's wife in ED. Pt requesting pain medication multiple times during visit. He reports that he is unable to sit due to pain from recent surgery. Pt's wife called [facility] and they report that chair cannot be laid flat, but does recline almost all the way. Pt feels that this may be okay then. Discussed transport as pt said there is no way he can sit in a wheelchair or car. CSW called EMS while in room and asked if transport could be arranged to and from dialysis. Pt's wife's number given for follow up at her request. CSW received call back from EMS that this is not done. ED RN aware and will notify wife. Discussed with EDP.   Benay Pike, [place]

## 2016-03-03 NOTE — ED Notes (Signed)
Patient's wound still bleeding.

## 2016-03-04 ENCOUNTER — Ambulatory Visit (HOSPITAL_COMMUNITY): Payer: Medicare Other

## 2016-03-04 DIAGNOSIS — D62 Acute posthemorrhagic anemia: Secondary | ICD-10-CM | POA: Diagnosis present

## 2016-03-04 DIAGNOSIS — E1122 Type 2 diabetes mellitus with diabetic chronic kidney disease: Secondary | ICD-10-CM | POA: Diagnosis present

## 2016-03-04 DIAGNOSIS — N5089 Other specified disorders of the male genital organs: Secondary | ICD-10-CM | POA: Diagnosis present

## 2016-03-04 DIAGNOSIS — E118 Type 2 diabetes mellitus with unspecified complications: Secondary | ICD-10-CM

## 2016-03-04 DIAGNOSIS — Z86718 Personal history of other venous thrombosis and embolism: Secondary | ICD-10-CM | POA: Diagnosis not present

## 2016-03-04 DIAGNOSIS — R29898 Other symptoms and signs involving the musculoskeletal system: Secondary | ICD-10-CM | POA: Diagnosis not present

## 2016-03-04 DIAGNOSIS — I429 Cardiomyopathy, unspecified: Secondary | ICD-10-CM | POA: Diagnosis present

## 2016-03-04 DIAGNOSIS — E871 Hypo-osmolality and hyponatremia: Secondary | ICD-10-CM | POA: Diagnosis present

## 2016-03-04 DIAGNOSIS — E875 Hyperkalemia: Secondary | ICD-10-CM | POA: Diagnosis present

## 2016-03-04 DIAGNOSIS — Z743 Need for continuous supervision: Secondary | ICD-10-CM | POA: Diagnosis not present

## 2016-03-04 DIAGNOSIS — M109 Gout, unspecified: Secondary | ICD-10-CM | POA: Diagnosis not present

## 2016-03-04 DIAGNOSIS — Z992 Dependence on renal dialysis: Secondary | ICD-10-CM | POA: Diagnosis not present

## 2016-03-04 DIAGNOSIS — I4510 Unspecified right bundle-branch block: Secondary | ICD-10-CM | POA: Diagnosis not present

## 2016-03-04 DIAGNOSIS — K5900 Constipation, unspecified: Secondary | ICD-10-CM | POA: Diagnosis not present

## 2016-03-04 DIAGNOSIS — Z79899 Other long term (current) drug therapy: Secondary | ICD-10-CM | POA: Diagnosis not present

## 2016-03-04 DIAGNOSIS — N186 End stage renal disease: Secondary | ICD-10-CM | POA: Diagnosis not present

## 2016-03-04 DIAGNOSIS — D631 Anemia in chronic kidney disease: Secondary | ICD-10-CM | POA: Diagnosis not present

## 2016-03-04 DIAGNOSIS — E1129 Type 2 diabetes mellitus with other diabetic kidney complication: Secondary | ICD-10-CM | POA: Diagnosis not present

## 2016-03-04 DIAGNOSIS — D72829 Elevated white blood cell count, unspecified: Secondary | ICD-10-CM | POA: Diagnosis not present

## 2016-03-04 DIAGNOSIS — N99821 Postprocedural hemorrhage and hematoma of a genitourinary system organ or structure following other procedure: Secondary | ICD-10-CM | POA: Diagnosis present

## 2016-03-04 DIAGNOSIS — E1151 Type 2 diabetes mellitus with diabetic peripheral angiopathy without gangrene: Secondary | ICD-10-CM | POA: Diagnosis present

## 2016-03-04 DIAGNOSIS — R791 Abnormal coagulation profile: Secondary | ICD-10-CM | POA: Diagnosis present

## 2016-03-04 DIAGNOSIS — Z7901 Long term (current) use of anticoagulants: Secondary | ICD-10-CM | POA: Diagnosis not present

## 2016-03-04 DIAGNOSIS — N529 Male erectile dysfunction, unspecified: Secondary | ICD-10-CM | POA: Diagnosis present

## 2016-03-04 DIAGNOSIS — R279 Unspecified lack of coordination: Secondary | ICD-10-CM | POA: Diagnosis not present

## 2016-03-04 DIAGNOSIS — I10 Essential (primary) hypertension: Secondary | ICD-10-CM | POA: Diagnosis not present

## 2016-03-04 DIAGNOSIS — E1140 Type 2 diabetes mellitus with diabetic neuropathy, unspecified: Secondary | ICD-10-CM | POA: Diagnosis present

## 2016-03-04 DIAGNOSIS — N400 Enlarged prostate without lower urinary tract symptoms: Secondary | ICD-10-CM | POA: Diagnosis present

## 2016-03-04 DIAGNOSIS — T148XXA Other injury of unspecified body region, initial encounter: Secondary | ICD-10-CM | POA: Diagnosis not present

## 2016-03-04 DIAGNOSIS — I120 Hypertensive chronic kidney disease with stage 5 chronic kidney disease or end stage renal disease: Secondary | ICD-10-CM | POA: Diagnosis present

## 2016-03-04 DIAGNOSIS — N492 Inflammatory disorders of scrotum: Secondary | ICD-10-CM | POA: Diagnosis not present

## 2016-03-04 LAB — BASIC METABOLIC PANEL
ANION GAP: 18 — AB (ref 5–15)
BUN: 112 mg/dL — ABNORMAL HIGH (ref 6–20)
CALCIUM: 8.8 mg/dL — AB (ref 8.9–10.3)
CO2: 22 mmol/L (ref 22–32)
Chloride: 94 mmol/L — ABNORMAL LOW (ref 101–111)
Creatinine, Ser: 17.23 mg/dL — ABNORMAL HIGH (ref 0.61–1.24)
GFR calc Af Amer: 3 mL/min — ABNORMAL LOW (ref >60)
GFR calc non Af Amer: 3 mL/min — ABNORMAL LOW (ref >60)
GLUCOSE: 92 mg/dL (ref 65–99)
POTASSIUM: 5.4 mmol/L — AB (ref 3.5–5.1)
Sodium: 134 mmol/L — ABNORMAL LOW (ref 135–145)

## 2016-03-04 LAB — GLUCOSE, CAPILLARY
GLUCOSE-CAPILLARY: 85 mg/dL (ref 65–99)
Glucose-Capillary: 97 mg/dL (ref 65–99)
Glucose-Capillary: 89 mg/dL (ref 65–99)
Glucose-Capillary: 91 mg/dL (ref 65–99)
Glucose-Capillary: 86 mg/dL (ref 65–99)

## 2016-03-04 LAB — CBC
HCT: 23 % — ABNORMAL LOW (ref 39.0–52.0)
HEMATOCRIT: 23.5 % — AB (ref 39.0–52.0)
HEMATOCRIT: 22.8 % — AB (ref 39.0–52.0)
HEMOGLOBIN: 7.6 g/dL — AB (ref 13.0–17.0)
HEMOGLOBIN: 7.8 g/dL — AB (ref 13.0–17.0)
HEMOGLOBIN: 7.8 g/dL — AB (ref 13.0–17.0)
MCH: 28.5 pg (ref 26.0–34.0)
MCH: 29.7 pg (ref 26.0–34.0)
MCH: 29.4 pg (ref 26.0–34.0)
MCHC: 32.3 g/dL (ref 30.0–36.0)
MCHC: 34.2 g/dL (ref 30.0–36.0)
MCHC: 33.9 g/dL (ref 30.0–36.0)
MCV: 88 fL (ref 78.0–100.0)
MCV: 86.7 fL (ref 78.0–100.0)
MCV: 86.8 fL (ref 78.0–100.0)
Platelets: 266 10*3/uL (ref 150–400)
Platelets: 258 10*3/uL (ref 150–400)
Platelets: 294 10*3/uL (ref 150–400)
RBC: 2.67 MIL/uL — ABNORMAL LOW (ref 4.22–5.81)
RBC: 2.63 MIL/uL — AB (ref 4.22–5.81)
RBC: 2.65 MIL/uL — ABNORMAL LOW (ref 4.22–5.81)
RDW: 15.5 % (ref 11.5–15.5)
RDW: 16.4 % — ABNORMAL HIGH (ref 11.5–15.5)
RDW: 16.3 % — AB (ref 11.5–15.5)
WBC: 12.2 10*3/uL — ABNORMAL HIGH (ref 4.0–10.5)
WBC: 13.4 10*3/uL — ABNORMAL HIGH (ref 4.0–10.5)
WBC: 15 10*3/uL — AB (ref 4.0–10.5)

## 2016-03-04 LAB — AEROBIC/ANAEROBIC CULTURE (SURGICAL/DEEP WOUND)

## 2016-03-04 LAB — AEROBIC/ANAEROBIC CULTURE W GRAM STAIN (SURGICAL/DEEP WOUND)

## 2016-03-04 LAB — PROTIME-INR
INR: 3.68
Prothrombin Time: 37.4 seconds — ABNORMAL HIGH (ref 11.4–15.2)

## 2016-03-04 LAB — PREPARE RBC (CROSSMATCH)

## 2016-03-04 MED ORDER — EPOETIN ALFA 10000 UNIT/ML IJ SOLN
INTRAMUSCULAR | Status: AC
  Administered 2016-03-04: 10000 [IU] via INTRAVENOUS
  Filled 2016-03-04: qty 1

## 2016-03-04 MED ORDER — PENTAFLUOROPROP-TETRAFLUOROETH EX AERO: 1.0000 "application " | INHALATION_SPRAY | CUTANEOUS | Status: DC | PRN

## 2016-03-04 MED ORDER — PHYTONADIONE 1 MG/0.5 ML ORAL SOLUTION
1.0000 mg | Freq: Once | ORAL | Status: AC
  Administered 2016-03-04: 1 mg via ORAL
  Filled 2016-03-04: qty 0.5

## 2016-03-04 MED ORDER — HEPARIN SODIUM (PORCINE) 1000 UNIT/ML DIALYSIS
1000.0000 [IU] | INTRAMUSCULAR | Status: DC | PRN
  Administered 2016-03-04 – 2016-03-05 (×2): 1000 [IU] via INTRAVENOUS_CENTRAL
  Filled 2016-03-04 (×3): qty 1

## 2016-03-04 MED ORDER — ALTEPLASE 2 MG IJ SOLR
2.0000 mg | Freq: Once | INTRAMUSCULAR | Status: DC | PRN
  Filled 2016-03-04: qty 2

## 2016-03-04 MED ORDER — SODIUM CHLORIDE 0.9 % IV SOLN: Freq: Once | INTRAVENOUS | Status: DC

## 2016-03-04 MED ORDER — SODIUM CHLORIDE 0.9 % IV SOLN: 100.0000 mL | INTRAVENOUS | Status: DC | PRN

## 2016-03-04 MED ORDER — HEPARIN SODIUM (PORCINE) 1000 UNIT/ML IJ SOLN
INTRAMUSCULAR | Status: AC
  Administered 2016-03-04: 1000 [IU] via INTRAVENOUS_CENTRAL
  Filled 2016-03-04: qty 1

## 2016-03-04 MED ORDER — EPOETIN ALFA 10000 UNIT/ML IJ SOLN
10000.0000 [IU] | INTRAMUSCULAR | Status: DC
  Administered 2016-03-04 – 2016-03-07 (×2): 10000 [IU] via INTRAVENOUS
  Filled 2016-03-04 (×3): qty 1

## 2016-03-04 MED ORDER — LIDOCAINE-PRILOCAINE 2.5-2.5 % EX CREA
1.0000 "application " | TOPICAL_CREAM | CUTANEOUS | Status: DC | PRN
  Filled 2016-03-04: qty 5

## 2016-03-04 MED ORDER — GUAIFENESIN-DM 100-10 MG/5ML PO SYRP
5.0000 mL | ORAL_SOLUTION | ORAL | Status: DC | PRN
  Administered 2016-03-04: 5 mL via ORAL
  Filled 2016-03-04: qty 5

## 2016-03-04 MED ORDER — LIDOCAINE HCL (PF) 1 % IJ SOLN: 5.0000 mL | INTRAMUSCULAR | Status: DC | PRN

## 2016-03-04 NOTE — Progress Notes (Signed)
pharmacy aware of vit K needed.

## 2016-03-04 NOTE — Progress Notes (Signed)
I was asked by Dr. Sarajane Jews to evaluate the patient for bleeding.  It was reported by staff that patient had soaked through dressings on scrotum and continued to bleed. On my arrival, patient was resting comfortably in bed. His vitals have been stable. He is not hypotensive. Last hemoglobin was 7.8 at noon, which is stable since this morning. INR elevated at 3.6. He received a dose of vit K today.  On exam, patient had soaked through gauze dressings that were in place and there was some blood on the pad under patient. On removing gauze, there did not appear to be any signs of active bleeding. He had very minimal oozing from incision site. This was cleaned and fresh gauze was applied. Staff was asked to inform urology of bleeding. Repeat CBC has been ordered along with type and cross. Patient will beg going to dialysis at this time. He appears stable. He can be transfused prbc if hemoglobin has dropped from prior level.  Chonita Gadea

## 2016-03-04 NOTE — Consult Note (Signed)
Urology Consult  Consulting MD: Sarajane Jews  CC: Scrotal abscess, status post I and D with recent bleeding  HPI: This is a 51 year old male with multiple medical issues including end-stage renal disease, on hemodialysis, history of DVT, on Coumadin, diabetes, diabetic neuropathy, status post recent I and D of a scrotal abscess by Dr. Bosie Clos practice.  That was performed on 02/25/2016.  Culture grew strep viridans, possible anaerobes (still pending).  The patient presented to the emergency room here yesterday with significant bleeding from his incision and drainage site.  The bleeding was stopped in the emergency room.  He is admitted for management of this as well as for hemodialysis, which was not performed per usual yesterday due to the patient not being able to be transported.  His hematocrit on 1/29 was 29.2.  This morning, 22.8.  INR was 3.68 this morning.  He has had no bleeding since being admitted.  His dressing was being changed at home.  He is still having some scrotal discomfort.  PMH: Past Medical History:  Diagnosis Date  . Anemia   . Arthritis   . BPH (benign prostatic hyperplasia)   . Chronic back pain   . Difficulty walking   . DVT (deep venous thrombosis) ([place])    Right popliteal DVT December 2017  . End-stage renal disease on hemodialysis ([place])    Dr. Lowanda Foster  . Erectile dysfunction   . Essential hypertension   . Gout   . Neuropathy, diabetic ([place])   . Type 2 diabetes mellitus ([place])   . Venous (peripheral) insufficiency     PSH: Past Surgical History:  Procedure Laterality Date  . AV FISTULA PLACEMENT Left 03/09/2014   Procedure: INSERTION OF ARTERIOVENOUS (AV) GORE-TEX GRAFT ARM;  Surgeon: Angelia Mould, MD;  Location: [facility];  Service: Vascular;  Laterality: Left;  . CYST EXCISION Right    cyst removed on thumb 2001  . INSERTION OF DIALYSIS CATHETER    . INSERTION OF DIALYSIS CATHETER Left 03/09/2014   Procedure: INSERTION OF DIALYSIS  CATHETER;  Surgeon: Angelia Mould, MD;  Location: [facility];  Service: Vascular;  Laterality: Left;  . IRRIGATION AND DEBRIDEMENT ABSCESS N/A 02/25/2016   Procedure: IRRIGATION AND DEBRIDEMENT SCROTAL ABSCESS;  Surgeon: Nickie Retort, MD;  [facility];  Service: Urology;  Laterality: N/A;    Allergies: No Known Allergies  Medications: Prescriptions Prior to Admission  Medication Sig Dispense Refill Last Dose  . clindamycin (CLEOCIN) 300 MG capsule Take 1 capsule (300 mg total) by mouth every 8 (eight) hours. 30 capsule 0 03/03/2016 at Unknown time  . colchicine 0.6 MG tablet 1/2 tab po 2 times /week (Patient taking differently: Take 0.3 mg by mouth daily as needed (for gout). ) 15 tablet 0 unknown  . gabapentin (NEURONTIN) 100 MG capsule Take 100 mg by mouth daily.    03/04/2016 at Unknown time  . Multiple Vitamin (DAILY VITE PO) Take 1 tablet by mouth daily.   unknown  . sevelamer carbonate (RENVELA) 800 MG tablet Take 4 tablets (3,200 mg total) by mouth 3 (three) times daily with meals. 90 tablet 0 03/03/2016 at Unknown time  . sevelamer carbonate (RENVELA) 800 MG tablet Take 1 tablet (800 mg total) by mouth 2 (two) times daily between meals as needed (With snacks). 60 tablet 0 03/03/2016 at Unknown time  . sildenafil (REVATIO) 20 MG tablet Take 20 mg by mouth daily as needed. Dissolved under tongue (TROCHE) 60 minutes prior to sexual encounter  unknown  . traMADol (ULTRAM) 50 MG tablet Take 1 tablet (50 mg total) by mouth daily as needed (with dressing changes). 30 tablet 0 03/03/2016 at Unknown time  . ULORIC 80 MG TABS Take 80 mg by mouth daily.   03/03/2016 at Unknown time  . warfarin (COUMADIN) 5 MG tablet Take 2.5 mg by mouth daily. Take 1/2 tablet daily   Past Week at 1900     Social History: Social History   Social History  . Marital status: Married    Spouse name: N/A  . Number of children: N/A  . Years of education: N/A   Occupational History  . Not on file.    Social History Main Topics  . Smoking status: Never Smoker  . Smokeless tobacco: Never Used  . Alcohol use No  . Drug use: No  . Sexual activity: Not on file   Other Topics Concern  . Not on file   Social History Narrative  . No narrative on file    Family History: Family History  Problem Relation Age of Onset  . Diabetes Mother   . Hypertension Mother   . Heart failure Mother   . Hyperlipidemia Mother   . Heart attack Mother   . Cancer Father   . Diabetes Father   . Hypertension Father   . Hyperlipidemia Father     Review of Systems: Positive: Scrotal pain, scrotal swelling, scrotal bleeding Negative:   A further 10 point review of systems was negative except what is listed in the HPI.  Physical Exam: @VITALS2 @ General: No acute distress.  Awake. Head:  Normocephalic.  Atraumatic. ENT:  EOMI.  Mucous membranes moist Neck:  Supple.  No lymphadenopathy. CV:  S1 present. S2 present. Regular rate. Pulmonary: Equal effort bilaterally.  Clear to auscultation bilaterally. Abdomen: Obese, soft, non- tender to palpation. Skin:  Normal turgor.  No visible rash. Extremity: No gross deformity of bilateral upper extremities.  No gross deformity of bilateral lower extremities. Neurologic: Alert. Appropriate mood.  Penis:    No lesions. Urethra            Orthotopic meatus. Scrotum: Significant right scrotal edema.  There is notable fluctuance in the lateral scrotum near his right thigh.  This is mildly tender.  There is no surrounding erythema.  There is a small amount of bleeding from his incision and drainage site.  The dressing was removed.  It was replaced with saline/cause wet-to-dry.   Studies:  Recent Labs     03/04/16  0604  03/04/16  1158  HGB  7.6*  7.8*  WBC  12.2*  13.4*  PLT  266  258    Recent Labs     03/03/16  1426  03/04/16  0604  NA  133*  134*  K  4.9  5.4*  CL  93*  94*  CO2  22  22  BUN  98*  112*  CREATININE  15.50*  17.23*  CALCIUM   9.0  8.8*  GFRNONAA  3*  3*  GFRAA  4*  3*     Recent Labs     03/03/16  1426  03/04/16  0604  INR  3.47  3.68     Invalid input(s): ABG    Assessment:  Scrotal abscess, status post incision and drainage on 02/25/2016.  It looks like he might still have an undrained area in his right lateral scrotum.  His bleeding from episode yesterday.  Has stopped.  At this point, he is  not septic, and his scrotum needs to be drained.  However, with his INR.  3.68, I do not think he is a candidate for drainage at this time, especially if this is not an emergent process.  Plan: 1.  I think it's fine for him to eat at the present time.  2.  We will need to follow him up carefully, and, hopefully, provide repeat incision and drainage later this week, when his INR normalizes.  I don't think we should do this at the present time, seeing the amount of bleeding that he had yesterday.  3.  Dr. Alyson Ingles will be covering tomorrow, Dr. Jeffie Pollock on Friday.  Depending on his INR, we can plan on doing, hopefully, repeat incision and drainage later this week.  I will speak with both of them regarding this.    Pager:[phone]

## 2016-03-04 NOTE — Care Management Note (Signed)
Case Management Note  Patient Details  Name: Jack Irwin MRN: [id number] Date of Birth: [birth year]  Subjective/Objective:    Patient adm with scotal wound and new bleeding from wound. He missed dialysis yesterday due to scrotal pain and being in ER. He has dialysis at [facility]. SW consulted yesterday regarding dialysis issues due to not being able to sit for dialysis due to pain. Patient report ind with ADL' usually, does have WC at home if needed. No HH PTA.   Action/Plan: Anticipate DC home with self care. Urology consult pending.    Expected Discharge Date:       03/05/2016           Expected Discharge Plan:  Home/Self Care  In-House Referral:  Clinical Social Work  Discharge planning Services  CM Consult  Post Acute Care Choice:  NA Choice offered to:  NA  DME Arranged:    DME Agency:     HH Arranged:    HH Agency:     Status of Service:  Completed, signed off  If discussed at [employer], dates discussed:    Additional Comments:  Aoki Wedemeyer, Chauncey Reading, RN 03/04/2016, 11:59 AM

## 2016-03-04 NOTE — Progress Notes (Addendum)
PROGRESS NOTE  Jack Irwin HFW:[id number] DOB: [birth year] DOA: 03/03/2016 PCP: Kingsley Callander, FNP  Brief Narrative: 51 year old male with multiple medical problems status post scrotal abscess incision and drainage with discharge January 26, with oral antibiotics and daily dressing changes/packing. Presented to the ER 1/29 with an episode of significant bleeding from the wound in the context of ongoing anticoagulation for DVT. Plans were made for observation, trending of hemoglobin, monitoring of INR and urology follow-up anticipated 1/30 in the morning.  Assessment/Plan 1. Bleeding from scrotal wound. No ongoing bleeding at the present moment hemoglobin down 2 g consistent with significant bleeding by history. Complicated by elevated INR. 2. Acute blood loss anemia secondary to above. 3. Scrotal abscess status post incision and drainage prior to admission. Continue oral clindamycin. 4. Right lower extremity DVT December 2017. On warfarin. 5. End-stage renal disease. Management per nephrology. 6. Diabetes mellitus type 2 with diabetic neuropathy.   Appears stable on medical floor. Hemoglobin down 2 g but no evidence of ongoing bleeding. INR slightly higher today but given recent DVT and lack of ongoing bleeding, will not reverse anticoagulation at this time. We will continue daily INR and trend hemoglobin. Discussed with patient, if hemoglobin drops further will proceed with transfusion. Await urology evaluation and recommendations. Continue oral antibiotics.  Likely home 1/31 if cleared by urology and hemoglobin stable.  ADDENDUM Per Dr. Eulogio Ditch, pt will need I&D of scrotal abscess and thus will need INR reveresed. However, not urgent and surgery could be delayed until as long as Friday. Will give vitamin K po x1 and recheck in AM. Start heparin when INR <2.  DVT prophylaxis: SCDs Code Status: full code Family Communication: none Disposition Plan: home   Murray Hodgkins,  MD  [facility] Direct contact: [phone] --[place]ia [place]  --[email or web address]; password [email or web address]  7PM-7AM contact night coverage as above 03/04/2016, 11:23 AM  LOS: 0 days   Consultants:  Neurology  Procedures:    Antimicrobials:  Clindamycin (on prior to admission)  Interval history/Subjective: Feels okay. Has some indigestion. Breathing okay. Scrotum is swollen although this was present prior to admission. No gross bleeding since admission.  Objective: Vitals:   03/03/16 1730 03/03/16 1930 03/03/16 2151 03/04/16 0520  BP: (!) 150/106 (!) 136/102 133/89 134/82  Pulse: 90 93 87 93  Resp:  16 16 18   Temp:   97.5 F (36.4 C) 97.5 F (36.4 C)  TempSrc:   Oral Oral  SpO2: 98% 98% 97% 99%  Weight:   118.9 kg (262 lb 2 oz) 119.4 kg (263 lb 3.7 oz)  Height:   5\' 11"  (1.803 m)     Intake/Output Summary (Last 24 hours) at 03/04/16 1123 Last data filed at 03/04/16 0607  Gross per 24 hour  Intake              480 ml  Output              100 ml  Net              380 ml     Filed Weights   03/03/16 1339 03/03/16 2151 03/04/16 0520  Weight: 119.3 kg (263 lb) 118.9 kg (262 lb 2 oz) 119.4 kg (263 lb 3.7 oz)    Exam:    Constitutional: Appears calm, comfortable. Cardiovascular regular rate and rhythm. No murmur, rub or gallop. Respiratory. Clear to auscultation bilaterally. No wheezes, rales or rhonchi. Normal respiratory effort. Scrotum appears swollen.  I have personally reviewed following labs  and imaging studies:  BMP with modest hyperkalemia.   WBC without significant change, 12.2.  Hemoglobin 9.6 >> 7.6  Scheduled Meds: . clindamycin  300 mg Oral Q8H  . docusate sodium  100 mg Oral BID  . epoetin (EPOGEN/PROCRIT) injection  10,000 Units Intravenous Q T,Th,Sa-HD  . febuxostat  80 mg Oral Daily  . gabapentin  100 mg Oral Daily  . insulin aspart  0-15 Units Subcutaneous TID WC  . sevelamer carbonate  3,200 mg Oral TID WC   Continuous  Infusions:  Principal Problem:   Bleeding from wound Active Problems:   Hypertension   ESRD on dialysis ([place])   Scrotal abscess   Type 2 diabetes mellitus (HCC)   Chronic anticoagulation   LOS: 0 days

## 2016-03-04 NOTE — Consult Note (Signed)
Reason for Consult: End-stage renal disease Referring Physician: Dr. Sandrea Hughs is an 51 y.o. male.  HPI: He is a patient with history of hypertension, diabetes, DVT, end-stage renal disease on maintenance hemodialysis presently came with complaints of bleeding from the scrotal area. Patient was recently admitted to [facility] for scrotal abscess. Abscess was drained and patient was put on oral antibiotics and discharged home about 3 days ago. According to the patient yesterday when he wake her from sleep he found that he has bleeding from scrotal area hence decided to come to the emergency room. Patient presently denies any difficulty breathing. He has some back pain. Denies any fever.  Past Medical History:  Diagnosis Date  . Anemia   . Arthritis   . BPH (benign prostatic hyperplasia)   . Chronic back pain   . Difficulty walking   . DVT (deep venous thrombosis) ([place])    Right popliteal DVT December 2017  . End-stage renal disease on hemodialysis ([place])    Dr. Lowanda Foster  . Erectile dysfunction   . Essential hypertension   . Gout   . Neuropathy, diabetic ([place])   . Type 2 diabetes mellitus ([place])   . Venous (peripheral) insufficiency     Past Surgical History:  Procedure Laterality Date  . AV FISTULA PLACEMENT Left 03/09/2014   Procedure: INSERTION OF ARTERIOVENOUS (AV) GORE-TEX GRAFT ARM;  Surgeon: Angelia Mould, MD;  Location: [facility];  Service: Vascular;  Laterality: Left;  . CYST EXCISION Right    cyst removed on thumb 2001  . INSERTION OF DIALYSIS CATHETER    . INSERTION OF DIALYSIS CATHETER Left 03/09/2014   Procedure: INSERTION OF DIALYSIS CATHETER;  Surgeon: Angelia Mould, MD;  Location: [facility];  Service: Vascular;  Laterality: Left;  . IRRIGATION AND DEBRIDEMENT ABSCESS N/A 02/25/2016   Procedure: IRRIGATION AND DEBRIDEMENT SCROTAL ABSCESS;  Surgeon: Nickie Retort, MD;  Location: [facility];  Service: Urology;  Laterality: N/A;    Family History   Problem Relation Age of Onset  . Diabetes Mother   . Hypertension Mother   . Heart failure Mother   . Hyperlipidemia Mother   . Heart attack Mother   . Cancer Father   . Diabetes Father   . Hypertension Father   . Hyperlipidemia Father     Social History:  reports that he has never smoked. He has never used smokeless tobacco. He reports that he does not drink alcohol or use drugs.  Allergies: No Known Allergies  Medications: I have reviewed the patient's current medications.  Results for orders placed or performed during the hospital encounter of 03/03/16 (from the past 48 hour(s))  CBC     Status: Abnormal   Collection Time: 03/03/16  2:26 PM  Result Value Ref Range   WBC 12.5 (H) 4.0 - 10.5 K/uL   RBC 3.32 (L) 4.22 - 5.81 MIL/uL   Hemoglobin 9.6 (L) 13.0 - 17.0 g/dL   HCT 29.2 (L) 39.0 - 52.0 %   MCV 88.0 78.0 - 100.0 fL   MCH 28.9 26.0 - 34.0 pg   MCHC 32.9 30.0 - 36.0 g/dL   RDW 16.2 (H) 11.5 - 15.5 %   Platelets 270 150 - 400 K/uL  Protime-INR     Status: Abnormal   Collection Time: 03/03/16  2:26 PM  Result Value Ref Range   Prothrombin Time 35.7 (H) 11.4 - 15.2 seconds   INR 3.97   Basic metabolic panel     Status:  Abnormal   Collection Time: 03/03/16  2:26 PM  Result Value Ref Range   Sodium 133 (L) 135 - 145 mmol/L   Potassium 4.9 3.5 - 5.1 mmol/L   Chloride 93 (L) 101 - 111 mmol/L   CO2 22 22 - 32 mmol/L   Glucose, Bld 77 65 - 99 mg/dL   BUN 98 (H) 6 - 20 mg/dL   Creatinine, Ser 15.50 (H) 0.61 - 1.24 mg/dL   Calcium 9.0 8.9 - 10.3 mg/dL   GFR calc non Af Amer 3 (L) >60 mL/min   GFR calc Af Amer 4 (L) >60 mL/min    Comment: (NOTE) The eGFR has been calculated using the CKD EPI equation. This calculation has not been validated in all clinical situations. eGFR's persistently <60 mL/min signify possible Chronic Kidney Disease.    Anion gap 18 (H) 5 - 15  Glucose, capillary     Status: None   Collection Time: 03/03/16  9:57 PM  Result Value Ref Range    Glucose-Capillary 68 65 - 99 mg/dL   Comment 1 Notify RN    Comment 2 Document in Chart   Glucose, capillary     Status: Abnormal   Collection Time: 03/03/16 11:55 PM  Result Value Ref Range   Glucose-Capillary 114 (H) 65 - 99 mg/dL   Comment 1 Notify RN    Comment 2 Document in Chart   Glucose, capillary     Status: None   Collection Time: 03/04/16  4:00 AM  Result Value Ref Range   Glucose-Capillary 97 65 - 99 mg/dL   Comment 1 Notify RN    Comment 2 Document in Chart   Basic metabolic panel     Status: Abnormal   Collection Time: 03/04/16  6:04 AM  Result Value Ref Range   Sodium 134 (L) 135 - 145 mmol/L   Potassium 5.4 (H) 3.5 - 5.1 mmol/L   Chloride 94 (L) 101 - 111 mmol/L   CO2 22 22 - 32 mmol/L   Glucose, Bld 92 65 - 99 mg/dL   BUN 112 (H) 6 - 20 mg/dL    Comment: RESULTS CONFIRMED BY MANUAL DILUTION   Creatinine, Ser 17.23 (H) 0.61 - 1.24 mg/dL   Calcium 8.8 (L) 8.9 - 10.3 mg/dL   GFR calc non Af Amer 3 (L) >60 mL/min   GFR calc Af Amer 3 (L) >60 mL/min    Comment: (NOTE) The eGFR has been calculated using the CKD EPI equation. This calculation has not been validated in all clinical situations. eGFR's persistently <60 mL/min signify possible Chronic Kidney Disease.    Anion gap 18 (H) 5 - 15  CBC     Status: Abnormal   Collection Time: 03/04/16  6:04 AM  Result Value Ref Range   WBC 12.2 (H) 4.0 - 10.5 K/uL   RBC 2.67 (L) 4.22 - 5.81 MIL/uL   Hemoglobin 7.6 (L) 13.0 - 17.0 g/dL    Comment: DELTA CHECK NOTED   HCT 23.5 (L) 39.0 - 52.0 %   MCV 88.0 78.0 - 100.0 fL   MCH 28.5 26.0 - 34.0 pg   MCHC 32.3 30.0 - 36.0 g/dL   RDW 15.5 11.5 - 15.5 %   Platelets 266 150 - 400 K/uL  Protime-INR     Status: Abnormal   Collection Time: 03/04/16  6:04 AM  Result Value Ref Range   Prothrombin Time 37.4 (H) 11.4 - 15.2 seconds   INR 3.68     No results found.  Review of Systems  Constitutional: Positive for chills. Negative for fever.  Respiratory: Negative for  sputum production and shortness of breath.   Cardiovascular: Positive for leg swelling. Negative for chest pain, orthopnea and claudication.  Gastrointestinal: Negative for abdominal pain, nausea and vomiting.  Musculoskeletal: Positive for back pain.   Blood pressure 134/82, pulse 93, temperature 97.5 F (36.4 C), temperature source Oral, resp. rate 18, height 5' 11" (1.803 m), weight 119.4 kg (263 lb 3.7 oz), SpO2 99 %. Physical Exam  Constitutional: He is oriented to person, place, and time. No distress.  Eyes: No scleral icterus.  Neck: No JVD present.  Respiratory: No respiratory distress. He has no wheezes.  Decrease breath sound bilaterally  GI: He exhibits no distension. There is no tenderness.  Musculoskeletal: Normal range of motion.  Neurological: He is alert and oriented to person, place, and time.    Assessment/Plan: Problem #1 scrotal abscess: Status post I&D. Presently came because of bleeding. Presently patient still with swelling and has some bleeding. Problem #2 end-stage renal disease: His status post hemodialysis on Friday. Because of bleeding he didn't go to his dialysis yesterday. His BUN and creatinine seems to be very high. Patient however denies any nausea or vomiting. His potassium is slightly high. Problem #3 anemia: His hemoglobin is low most likely from bleeding. Problem #4 history of DVT: Presently is on antibiotics. Problem #5 history of hypertension: His blood pressure is reasonably controlled. Since his blood pressure has improved on dialysis his antihypertensive medication has been discontinued. Problem #6 metabolic bone disease: His calcium is range Problem #7 obesity Plan: 1]We'll make a regimen for patient to get dialysis today          2]We'll try to remove about 2 and half liters if systolic blood pressure is above 90          3]We'll hold heparin          4]We'll use Epogen 10,000 units IV after each dialysis          5]We'll check his CBC and  renal panel in the morning.  Kimbree Casanas S 03/04/2016, 7:59 AM

## 2016-03-04 NOTE — Care Management Obs Status (Signed)
[facility] NOTIFICATION   Patient Details  Name: BURNIS HALLING MRN: [id number] Date of Birth: [birth year]   Medicare Observation Status Notification Given:  Yes    Brevin Mcfadden, Chauncey Reading, RN 03/04/2016, 11:56 AM

## 2016-03-04 NOTE — Progress Notes (Signed)
Pt aware I&D tomorrow. Aware meal tray ordered. Ginger ale given. Cough med given due to pt c/o cough

## 2016-03-05 ENCOUNTER — Encounter (HOSPITAL_COMMUNITY): Payer: Self-pay | Admitting: Family Medicine

## 2016-03-05 DIAGNOSIS — D631 Anemia in chronic kidney disease: Secondary | ICD-10-CM | POA: Diagnosis present

## 2016-03-05 DIAGNOSIS — N186 End stage renal disease: Secondary | ICD-10-CM | POA: Diagnosis not present

## 2016-03-05 DIAGNOSIS — I10 Essential (primary) hypertension: Secondary | ICD-10-CM

## 2016-03-05 DIAGNOSIS — Z992 Dependence on renal dialysis: Secondary | ICD-10-CM | POA: Diagnosis not present

## 2016-03-05 DIAGNOSIS — N189 Chronic kidney disease, unspecified: Secondary | ICD-10-CM

## 2016-03-05 DIAGNOSIS — K5900 Constipation, unspecified: Secondary | ICD-10-CM | POA: Diagnosis present

## 2016-03-05 LAB — CBC
HCT: 22.5 % — ABNORMAL LOW (ref 39.0–52.0)
Hemoglobin: 7.6 g/dL — ABNORMAL LOW (ref 13.0–17.0)
MCH: 29.7 pg (ref 26.0–34.0)
MCHC: 33.8 g/dL (ref 30.0–36.0)
MCV: 87.9 fL (ref 78.0–100.0)
PLATELETS: 275 10*3/uL (ref 150–400)
RBC: 2.56 MIL/uL — ABNORMAL LOW (ref 4.22–5.81)
RDW: 16.4 % — AB (ref 11.5–15.5)
WBC: 14.2 10*3/uL — AB (ref 4.0–10.5)

## 2016-03-05 LAB — GLUCOSE, CAPILLARY
GLUCOSE-CAPILLARY: 103 mg/dL — AB (ref 65–99)
GLUCOSE-CAPILLARY: 107 mg/dL — AB (ref 65–99)
Glucose-Capillary: 110 mg/dL — ABNORMAL HIGH (ref 65–99)

## 2016-03-05 LAB — RENAL FUNCTION PANEL
Albumin: 2.6 g/dL — ABNORMAL LOW (ref 3.5–5.0)
Anion gap: 11 (ref 5–15)
BUN: 50 mg/dL — AB (ref 6–20)
CHLORIDE: 95 mmol/L — AB (ref 101–111)
CO2: 27 mmol/L (ref 22–32)
Calcium: 8.8 mg/dL — ABNORMAL LOW (ref 8.9–10.3)
Creatinine, Ser: 10.16 mg/dL — ABNORMAL HIGH (ref 0.61–1.24)
GFR calc Af Amer: 6 mL/min — ABNORMAL LOW (ref >60)
GFR, EST NON AFRICAN AMERICAN: 5 mL/min — AB (ref >60)
GLUCOSE: 132 mg/dL — AB (ref 65–99)
Phosphorus: 6.7 mg/dL — ABNORMAL HIGH (ref 2.5–4.6)
Potassium: 4.7 mmol/L (ref 3.5–5.1)
Sodium: 133 mmol/L — ABNORMAL LOW (ref 135–145)

## 2016-03-05 LAB — PROTIME-INR
INR: 2.18
Prothrombin Time: 24.6 seconds — ABNORMAL HIGH (ref 11.4–15.2)

## 2016-03-05 MED ORDER — POLYETHYLENE GLYCOL 3350 17 G PO PACK
34.0000 g | PACK | Freq: Two times a day (BID) | ORAL | Status: DC
  Administered 2016-03-05 – 2016-03-07 (×3): 34 g via ORAL
  Filled 2016-03-05 (×4): qty 2

## 2016-03-05 MED ORDER — HEPARIN SODIUM (PORCINE) 1000 UNIT/ML IJ SOLN
INTRAMUSCULAR | Status: AC
  Administered 2016-03-05: 1000 [IU] via INTRAVENOUS_CENTRAL
  Filled 2016-03-05: qty 1

## 2016-03-05 MED ORDER — SENNOSIDES-DOCUSATE SODIUM 8.6-50 MG PO TABS
2.0000 | ORAL_TABLET | Freq: Two times a day (BID) | ORAL | Status: DC
  Administered 2016-03-05 – 2016-03-13 (×14): 2 via ORAL
  Filled 2016-03-05 (×14): qty 2

## 2016-03-05 MED ORDER — SORBITOL 70 % SOLN
960.0000 mL | TOPICAL_OIL | Freq: Once | ORAL | Status: DC | PRN
  Filled 2016-03-05: qty 240

## 2016-03-05 NOTE — Progress Notes (Signed)
Pt refused his uloric and refused enema at this time. He states he feels really tired and wants to rest at this time. Other night time medications given at this time.

## 2016-03-05 NOTE — Progress Notes (Signed)
Patient reported feeling nauseous after dialysis.  Patient vomited approximately 300 cc.  Zofran given.  Patient reports feeling better but refuses enema.  Patient wants to wait and take later tonight.  Patient's wife at bedside.

## 2016-03-05 NOTE — Progress Notes (Signed)
Jack Irwin  MRN: [id number]  DOB/AGE: [birth year] 51 y.o.  Primary Care Physician:CARTER, Candiss Norse, FNP  Admit date: 03/03/2016  Chief Complaint:  Chief Complaint  Patient presents with  . Groin Pain  . Wound Check    Irwin-Pt presented on  03/03/2016 with  Chief Complaint  Patient presents with  . Groin Pain  . Wound Check  .    Pt today feels better. Pt says " When can I go home? I have not had any bleeding since last night"   Meds . sodium chloride   Intravenous Once  . clindamycin  300 mg Oral Q8H  . epoetin (EPOGEN/PROCRIT) injection  10,000 Units Intravenous Q T,Th,Sa-HD  . febuxostat  80 mg Oral Daily  . gabapentin  100 mg Oral Daily  . insulin aspart  0-15 Units Subcutaneous TID WC  . polyethylene glycol  34 g Oral BID  . senna-docusate  2 tablet Oral BID  . sevelamer carbonate  3,200 mg Oral TID WC      Physical Exam: Vital signs in last 24 hours: Temp:  [98.3 F (36.8 C)-98.8 F (37.1 C)] 98.4 F (36.9 C) (01/31 0645) Pulse Rate:  [94-119] 119 (01/31 0645) Resp:  [17-20] 17 (01/31 0645) BP: (90-157)/(45-95) 101/75 (01/31 0645) SpO2:  [95 %-100 %] 97 % (01/31 0645) Weight:  [252 lb 10.4 oz (114.6 kg)-263 lb 3.7 oz (119.4 kg)] 252 lb 10.4 oz (114.6 kg) (01/31 0645) Weight change: 3.7 oz (0.104 kg) Last BM Date: 02/27/16  Intake/Output from previous day: 01/30 0701 - 01/31 0700 In: -  Out: 2500  No intake/output data recorded.   Physical Exam: General- pt is awake,alert, oriented to time place and person Resp- No acute REsp distress, CTA B/L NO Rhonchi CVS- S1S2 regular in rate and rhythm GIT- BS+, soft, NT, ND EXT- NO LE Edema, Cyanosis Access- PC in situ   Lab Results: CBC  Recent Labs  03/04/16 1851 03/05/16 0458  WBC 15.0* 14.2*  HGB 7.8* 7.6*  HCT 23.0* 22.5*  PLT 294 275    BMET  Recent Labs  03/04/16 0604 03/05/16 0458  NA 134* 133*  K 5.4* 4.7  CL 94* 95*  CO2 22 27  GLUCOSE 92 132*  BUN 112* 50*  CREATININE  17.23* 10.16*  CALCIUM 8.8* 8.8*    MICRO Recent Results (from the past 240 hour(Irwin))  Aerobic/Anaerobic Culture (surgical/deep wound)     Status: None   Collection Time: 02/25/16  9:00 PM  Result Value Ref Range Status   Specimen Description ABSCESS SCROTUM  Final   Special Requests NONE  Final   Gram Stain   Final    FEW WBC PRESENT,BOTH PMN AND MONONUCLEAR FEW GRAM POSITIVE COCCI IN PAIRS    Culture   Final    MODERATE VIRIDANS STREPTOCOCCUS MODERATE PEPTOSTREPTOCOCCUS SPECIES MODERATE PREVOTELLA BIVIA BETA LACTAMASE POSITIVE Performed at [facility], [place] [address], [place], [place] [address]    Report Status 03/04/2016 FINAL  Final  MRSA PCR Screening     Status: None   Collection Time: 02/25/16 11:47 PM  Result Value Ref Range Status   MRSA by PCR NEGATIVE NEGATIVE Final    Comment:        The GeneXpert MRSA Assay (FDA approved for NASAL specimens only), is one component of a comprehensive MRSA colonization surveillance program. It is not intended to diagnose MRSA infection nor to guide or monitor treatment for MRSA infections.       Lab Results  Component Value Date   PTH <2.5 Result repeated and verified. (L) 09/12/2009   CALCIUM 8.8 (L) 03/05/2016   CAION 1.10 (L) 11/01/2015   PHOS 6.7 (H) 03/05/2016         Impression: 1)Renal  ESRD               Initiated on HD since 02/16/14 .               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   5)DM-Primary MD following  6)Electrolytes Hyperkalemic   Sec to ESRD Hyponatremic     Sec to  ESRD  7)Acid base Co2 at goal     Plan:  Will dialyze today Will not give any heparin Will use 2 k bath Will give short tx today as pt was done yesterday but pt may be going to procedure tomorrow and today is pt regular day.    Jack Irwin 03/05/2016, 9:36 AM

## 2016-03-05 NOTE — Progress Notes (Signed)
Late entry:  Dr. Wynetta Emery notified via text page of EKG in EPIC.

## 2016-03-05 NOTE — Progress Notes (Signed)
**Note De-identified Jack Irwin Obfuscation** EKG completed and placed in patient chart 

## 2016-03-05 NOTE — Progress Notes (Signed)
PROGRESS NOTE  Jack Irwin WUJ:[id number] DOB: 09-27-1965 DOA: 03/03/2016 PCP: Kingsley Callander, FNP  Brief Narrative: 51 year old male with multiple medical problems status post scrotal abscess incision and drainage with discharge January 26, with oral antibiotics and daily dressing changes/packing. Presented to the ER 1/29 with an episode of significant bleeding from the wound in the context of ongoing anticoagulation for DVT. Plans were made for observation, trending of hemoglobin, monitoring of INR and urology follow-up anticipated 1/30 in the morning.  Assessment/Plan 1. Bleeding from scrotal wound - improving now that INR is down.  Hg has been stable at 7.6, following, I changed dressing personally this morning and no bleeding seen.  INR down to 2 now s/p Vit K given 1/30.  Repeat in AM.  Likely INR will be less than 2 tomorrow.  Pt to go for I&D per urology when INR less than 2.   2. Acute blood loss anemia secondary to above. 3. Scrotal abscess status post incision and drainage prior to admission. Continue oral clindamycin. 4. Right lower extremity DVT December 2017. Holding warfarin for now.  Start heparin drip when INR less than 2 so can be rapidly stopped prior to surgery.   5. End-stage renal disease. Management per nephrology.  HD per nephrology team.  6. Diabetes mellitus type 2 with diabetic neuropathy.  BS well controlled, check 5x per day given his ESRD and concern for hypoglycemia.  7. Severe Constipation - Pt reporting no BM in last 9 days, will intensify laxatives, give enema now, repeat if needed, see orders.  8. Tachycardia - suspect sinus tach secondary to infection, check 12 lead EKG  DVT prophylaxis: SCDs Code Status: full code Family Communication: wife at bedside Disposition Plan: home   03/05/2016, 9:00 AM  LOS: 1 day   Consultants:  Nephrology  Urology  Procedures:   I&D pending  Antimicrobials:  Clindamycin (on prior to admission)  Interval  history/Subjective: Pt is really concerned about his swollen scrotum but reports that the bleeding has slightly improved.    Objective: Vitals:   03/04/16 2130 03/04/16 2135 03/04/16 2145 03/05/16 0645  BP: (!) 90/45 (!) 102/58 96/63 101/75  Pulse: (!) 112 (!) 107 (!) 117 (!) 119  Resp:  20 20 17   Temp:   98.8 F (37.1 C) 98.4 F (36.9 C)  TempSrc:   Oral Oral  SpO2:   100% 97%  Weight:    114.6 kg (252 lb 10.4 oz)  Height:        Intake/Output Summary (Last 24 hours) at 03/05/16 0900 Last data filed at 03/04/16 2130  Gross per 24 hour  Intake                0 ml  Output             2500 ml  Net            -2500 ml     Filed Weights   03/04/16 0520 03/04/16 1650 03/05/16 0645  Weight: 119.4 kg (263 lb 3.7 oz) 119.4 kg (263 lb 3.7 oz) 114.6 kg (252 lb 10.4 oz)    Exam:    Constitutional: Appears calm, comfortable. Cardiovascular regular rate and rhythm. No murmur, rub or gallop. Respiratory. Clear to auscultation bilaterally. No wheezes, rales or rhonchi. Normal respiratory effort. Scrotum appears swollen but no active bleeding seen.  I have personally reviewed following labs and imaging studies:  BMP with modest hyperkalemia.   WBC without significant change, 12.2.  Hemoglobin 9.6 >>  7.6  Scheduled Meds: . sodium chloride   Intravenous Once  . clindamycin  300 mg Oral Q8H  . docusate sodium  100 mg Oral BID  . epoetin (EPOGEN/PROCRIT) injection  10,000 Units Intravenous Q T,Th,Sa-HD  . febuxostat  80 mg Oral Daily  . gabapentin  100 mg Oral Daily  . insulin aspart  0-15 Units Subcutaneous TID WC  . sevelamer carbonate  3,200 mg Oral TID WC   Continuous Infusions:  Principal Problem:   Bleeding from wound Active Problems:   Hypertension   ESRD on dialysis [facility])   Scrotal abscess   Type 2 diabetes mellitus ([place])   Chronic anticoagulation   LOS: 1 day   Clanford Johnson 03/05/2016 9:18 AM

## 2016-03-05 NOTE — Progress Notes (Signed)
  Subjective: Pt report no pain from scrotal incision. Incision was not draining this morning but after dialysis he began to have bloody drainage. Afebrile. Hemoglobin stable  Objective: Vital signs in last 24 hours: Temp:  [98.3 F (36.8 C)-98.8 F (37.1 C)] 98.3 F (36.8 C) (01/31 1312) Pulse Rate:  [104-125] 104 (01/31 1312) Resp:  [17-20] 20 (01/31 1312) BP: (93-119)/(47-75) 105/69 (01/31 1312) SpO2:  [94 %-100 %] 94 % (01/31 1927) Weight:  [114.6 kg (252 lb 10.4 oz)] 114.6 kg (252 lb 10.4 oz) (01/31 1000)  Intake/Output from previous day: 01/30 0701 - 01/31 0700 In: -  Out: 2500  Intake/Output this shift: No intake/output data recorded.  Physical Exam:  General:alert, cooperative and appears older than stated age GI: soft, non tender, normal bowel sounds, no palpable masses, no organomegaly, no inguinal hernia Male genitalia: Penis: normal, no lesions Urethral Meatus: normal Testicles: normal, no masses Scrotum: abscess: right and edema: right Epididymis: normal Extremities: extremities normal, atraumatic, no cyanosis or edema  Lab Results:  Recent Labs  03/04/16 1158 03/04/16 1851 03/05/16 0458  HGB 7.8* 7.8* 7.6*  HCT 22.8* 23.0* 22.5*   BMET  Recent Labs  03/04/16 0604 03/05/16 0458  NA 134* 133*  K 5.4* 4.7  CL 94* 95*  CO2 22 27  GLUCOSE 92 132*  BUN 112* 50*  CREATININE 17.23* 10.16*  CALCIUM 8.8* 8.8*    Recent Labs  03/03/16 1426 03/04/16 0604 03/05/16 0458  INR 3.47 3.68 2.18   No results for input(s): LABURIN in the last 72 hours. Results for orders placed or performed during the hospital encounter of 02/25/16  Aerobic/Anaerobic Culture (surgical/deep wound)     Status: None   Collection Time: 02/25/16  9:00 PM  Result Value Ref Range Status   Specimen Description ABSCESS SCROTUM  Final   Special Requests NONE  Final   Gram Stain   Final    FEW WBC PRESENT,BOTH PMN AND MONONUCLEAR FEW GRAM POSITIVE COCCI IN PAIRS    Culture    Final    MODERATE VIRIDANS STREPTOCOCCUS MODERATE PEPTOSTREPTOCOCCUS SPECIES MODERATE PREVOTELLA BIVIA BETA LACTAMASE POSITIVE Performed at [facility], [place] [address], [place], [place] [address]    Report Status 03/04/2016 FINAL  Final  MRSA PCR Screening     Status: None   Collection Time: 02/25/16 11:47 PM  Result Value Ref Range Status   MRSA by PCR NEGATIVE NEGATIVE Final    Comment:        The GeneXpert MRSA Assay (FDA approved for NASAL specimens only), is one component of a comprehensive MRSA colonization surveillance program. It is not intended to diagnose MRSA infection nor to guide or monitor treatment for MRSA infections.     Studies/Results: No results found.  Assessment/Plan: 51yo with scrotal abscess s/p ID with bloody drainage from incision  1. Small area opened on right hemiscotrum and hematoma drained. Continue daily packing changes. Continue clindamycin. Continue to hold coumadin. If INR is below 2 and edema worsens, the patient will require further I&D of his scrotum   LOS: 1 day   Nicolette Bang 03/05/2016, 9:43 PM

## 2016-03-06 ENCOUNTER — Ambulatory Visit: Payer: Medicare Other

## 2016-03-06 ENCOUNTER — Inpatient Hospital Stay (HOSPITAL_COMMUNITY): Payer: Medicare Other

## 2016-03-06 DIAGNOSIS — R29898 Other symptoms and signs involving the musculoskeletal system: Secondary | ICD-10-CM

## 2016-03-06 LAB — GLUCOSE, CAPILLARY
GLUCOSE-CAPILLARY: 90 mg/dL (ref 65–99)
Glucose-Capillary: 85 mg/dL (ref 65–99)
Glucose-Capillary: 90 mg/dL (ref 65–99)
Glucose-Capillary: 90 mg/dL (ref 65–99)
Glucose-Capillary: 106 mg/dL — ABNORMAL HIGH (ref 65–99)
Glucose-Capillary: 107 mg/dL — ABNORMAL HIGH (ref 65–99)

## 2016-03-06 LAB — CBC
HEMATOCRIT: 22.4 % — AB (ref 39.0–52.0)
HEMOGLOBIN: 7.3 g/dL — AB (ref 13.0–17.0)
MCH: 29.2 pg (ref 26.0–34.0)
MCHC: 32.6 g/dL (ref 30.0–36.0)
MCV: 89.6 fL (ref 78.0–100.0)
Platelets: 312 10*3/uL (ref 150–400)
RBC: 2.5 MIL/uL — AB (ref 4.22–5.81)
RDW: 16.1 % — ABNORMAL HIGH (ref 11.5–15.5)
WBC: 18.5 10*3/uL — ABNORMAL HIGH (ref 4.0–10.5)

## 2016-03-06 LAB — PROTIME-INR
INR: 1.42
Prothrombin Time: 17.5 seconds — ABNORMAL HIGH (ref 11.4–15.2)

## 2016-03-06 LAB — RENAL FUNCTION PANEL
ALBUMIN: 2.8 g/dL — AB (ref 3.5–5.0)
ANION GAP: 10 (ref 5–15)
BUN: 42 mg/dL — ABNORMAL HIGH (ref 6–20)
CALCIUM: 9.3 mg/dL (ref 8.9–10.3)
CO2: 28 mmol/L (ref 22–32)
Chloride: 96 mmol/L — ABNORMAL LOW (ref 101–111)
Creatinine, Ser: 9.08 mg/dL — ABNORMAL HIGH (ref 0.61–1.24)
GFR calc non Af Amer: 6 mL/min — ABNORMAL LOW (ref >60)
GFR, EST AFRICAN AMERICAN: 7 mL/min — AB (ref >60)
Glucose, Bld: 91 mg/dL (ref 65–99)
PHOSPHORUS: 7.3 mg/dL — AB (ref 2.5–4.6)
Potassium: 4.5 mmol/L (ref 3.5–5.1)
SODIUM: 134 mmol/L — AB (ref 135–145)

## 2016-03-06 LAB — HEPARIN LEVEL (UNFRACTIONATED)

## 2016-03-06 MED ORDER — HEPARIN BOLUS VIA INFUSION
3000.0000 [IU] | Freq: Once | INTRAVENOUS | Status: AC
  Administered 2016-03-06: 3000 [IU] via INTRAVENOUS
  Filled 2016-03-06: qty 3000

## 2016-03-06 MED ORDER — HEPARIN (PORCINE) IN NACL 100-0.45 UNIT/ML-% IJ SOLN
2050.0000 [IU]/h | INTRAMUSCULAR | Status: DC
  Administered 2016-03-06: 2000 [IU]/h via INTRAVENOUS
  Administered 2016-03-06: 1600 [IU]/h via INTRAVENOUS
  Administered 2016-03-07: 2200 [IU]/h via INTRAVENOUS
  Administered 2016-03-08: 1900 [IU]/h via INTRAVENOUS
  Administered 2016-03-09: 2050 [IU]/h via INTRAVENOUS
  Administered 2016-03-09: 1900 [IU]/h via INTRAVENOUS
  Administered 2016-03-10 – 2016-03-12 (×4): 2050 [IU]/h via INTRAVENOUS
  Filled 2016-03-06 (×13): qty 250

## 2016-03-06 MED ORDER — HEPARIN BOLUS VIA INFUSION
4000.0000 [IU] | Freq: Once | INTRAVENOUS | Status: AC
  Administered 2016-03-06: 4000 [IU] via INTRAVENOUS
  Filled 2016-03-06: qty 4000

## 2016-03-06 NOTE — Progress Notes (Signed)
ANTICOAGULATION CONSULT NOTE - Initial Consult  Pharmacy Consult for Heparin Indication: VTE treatment  No Known Allergies  Patient Measurements: Height: 5\' 11"  (180.3 cm) Weight: 252 lb 10.4 oz (114.6 kg) IBW/kg (Calculated) : 75.3 HEPARIN DW (KG): 101.6  Vital Signs: Temp: 98.4 F (36.9 C) (02/01 1348) Temp Source: Oral (02/01 1348) BP: 101/72 (02/01 1348) Pulse Rate: 116 (02/01 1348)  Labs:  Recent Labs  03/04/16 0604  03/04/16 1851 03/05/16 0458 03/06/16 0606 03/06/16 1951  HGB 7.6*  < > 7.8* 7.6* 7.3*  --   HCT 23.5*  < > 23.0* 22.5* 22.4*  --   PLT 266  < > 294 275 312  --   LABPROT 37.4*  --   --  24.6* 17.5*  --   INR 3.68  --   --  2.18 1.42  --   HEPARINUNFRC  --   --   --   --   --  <0.10*  CREATININE 17.23*  --   --  10.16* 9.08*  --   < > = values in this interval not displayed.  Estimated Creatinine Clearance: 12.8 mL/min (by C-G formula based on SCr of 9.08 mg/dL (H)).   Medical History: Past Medical History:  Diagnosis Date  . Anemia   . Arthritis   . BPH (benign prostatic hyperplasia)   . Chronic back pain   . Difficulty walking   . DVT (deep venous thrombosis) ([place])    Right popliteal DVT December 2017  . End-stage renal disease on hemodialysis ([place])    Dr. Lowanda Foster  . Erectile dysfunction   . Essential hypertension   . Gout   . Neuropathy, diabetic ([place])   . Type 2 diabetes mellitus ([place])   . Venous (peripheral) insufficiency     Medications:  Prescriptions Prior to Admission  Medication Sig Dispense Refill Last Dose  . clindamycin (CLEOCIN) 300 MG capsule Take 1 capsule (300 mg total) by mouth every 8 (eight) hours. 30 capsule 0 03/03/2016 at Unknown time  . colchicine 0.6 MG tablet 1/2 tab po 2 times /week (Patient taking differently: Take 0.3 mg by mouth daily as needed (for gout). ) 15 tablet 0 unknown  . gabapentin (NEURONTIN) 100 MG capsule Take 100 mg by mouth daily.    03/04/2016 at Unknown time  .  Multiple Vitamin (DAILY VITE PO) Take 1 tablet by mouth daily.   unknown  . sevelamer carbonate (RENVELA) 800 MG tablet Take 4 tablets (3,200 mg total) by mouth 3 (three) times daily with meals. 90 tablet 0 03/03/2016 at Unknown time  . sevelamer carbonate (RENVELA) 800 MG tablet Take 1 tablet (800 mg total) by mouth 2 (two) times daily between meals as needed (With snacks). 60 tablet 0 03/03/2016 at Unknown time  . sildenafil (REVATIO) 20 MG tablet Take 20 mg by mouth daily as needed. Dissolved under tongue (TROCHE) 60 minutes prior to sexual encounter   unknown  . traMADol (ULTRAM) 50 MG tablet Take 1 tablet (50 mg total) by mouth daily as needed (with dressing changes). 30 tablet 0 03/03/2016 at Unknown time  . ULORIC 80 MG TABS Take 80 mg by mouth daily.   03/03/2016 at Unknown time  . warfarin (COUMADIN) 5 MG tablet Take 2.5 mg by mouth daily. Take 1/2 tablet daily   Past Week at 1900    Assessment: 51 year old male with multiple medical problems including ESRD on HD,  recently discharge January 26 for scrotal abscess that was I/D. Presented to the ER 1/29  with an episode of significant bleeding from the wound in the context of ongoing anticoagulation for DVT.There is no ongoing bleeding. He was transfused and given Vit. K 1mg  on 1/30. Asked to restart anticoagulation today with heparin. Heparin level is subtherapeutic.   Goal of Therapy:  Anti-Xa level 0.3-7  Monitor platelets by anticoagulation protocol: Yes   Plan:  Give 3000 units bolus x 1 Increase heparin infusion to 2000 units/hr Check anti-Xa level in 8 hours and daily while on heparin Continue to monitor H&H and platelets  Isac Sarna, BS Vena [place], BCPS Clinical Pharmacist Pager [phone] 03/06/2016,8:28 PM

## 2016-03-06 NOTE — Progress Notes (Signed)
Patient received tap water enema per Dr. Wynetta Emery.  Patient had large bowel movement after enema.  Dr. Wynetta Emery on unit and notified.

## 2016-03-06 NOTE — Progress Notes (Signed)
Subjective: Interval History: has no complaint of  . No nausea or vomiting. Patient also denies any difficulty in breathing.  Objective: Vital signs in last 24 hours: Temp:  [98.1 F (36.7 C)-98.7 F (37.1 C)] 98.1 F (36.7 C) (02/01 0650) Pulse Rate:  [102-125] 102 (02/01 0650) Resp:  [18-20] 20 (02/01 0650) BP: (93-119)/(47-70) 94/52 (02/01 0650) SpO2:  [94 %-97 %] 96 % (02/01 0650) Weight:  [114.6 kg (252 lb 10.4 oz)] 114.6 kg (252 lb 10.4 oz) (01/31 1000) Weight change: -4.8 kg (-10 lb 9.3 oz)  Intake/Output from previous day: 01/31 0701 - 02/01 0700 In: -  Out: 1200  Intake/Output this shift: No intake/output data recorded.  General appearance: alert, cooperative and no distress Resp: clear to auscultation bilaterally Cardio: regular rate and rhythm Male genitalia: Patient with scrotal swelling and edema. Extremities: venous stasis dermatitis noted  Lab Results:  Recent Labs  03/05/16 0458 03/06/16 0606  WBC 14.2* 18.5*  HGB 7.6* 7.3*  HCT 22.5* 22.4*  PLT 275 312   BMET:  Recent Labs  03/05/16 0458 03/06/16 0606  NA 133* 134*  K 4.7 4.5  CL 95* 96*  CO2 27 28  GLUCOSE 132* 91  BUN 50* 42*  CREATININE 10.16* 9.08*  CALCIUM 8.8* 9.3   No results for input(s): PTH in the last 72 hours. Iron Studies: No results for input(s): IRON, TIBC, TRANSFERRIN, FERRITIN in the last 72 hours.  Studies/Results: Dg Abd Portable 1v  Result Date: 03/06/2016 CLINICAL DATA:  Constipation . EXAM: PORTABLE ABDOMEN - 1 VIEW COMPARISON:  CT 02/25/2016.  CT 11/01/2015. FINDINGS: Soft tissue structures are unremarkable. Prominent amount of stool noted throughout the colon suggesting constipation. No prominent bowel distention. No free air. Degenerative changes thoracolumbar spine IMPRESSION: 1. Prominent amount of stool noted throughout the colon consistent with constipation. 2.  Degenerative changes thoracolumbar spine. Electronically Signed   By: Marcello Moores  Register   On:  03/06/2016 09:18    I have reviewed the patient's current medications.  Assessment/Plan: Problem #1 scrotal abscess: Status post drainage. Presently admitted because of bleeding. Problem #2 end-stage renal disease: Status post hemodialysis yesterday. Patient has this moment doesn't have any uremic signs and symptoms. His potassium is normal.  Problem #3 anemia: His hemoglobin is low but stable. This is secondary to bleeding. Problem #4 metabolic bone disease: His calcium is range but phosphorus is high Problem #5 fluid management: Presently he doesn't have any sign of fluid overload. Patient was dialyzed for 2 consecutive days with ultrafiltration. Problem #6 history of cardiomyopathy Problem #7 history of obesity Plan: 1]Patient doesn't require dialysis today 2]We'll start patient on Renvela 800 mg 3 tablets by mouth 3 times a day with meals 3] will DC calcium supplement 4] will make arrangements for patient to get dialysis tomorrow 5] will check his renal panel and CBC in the morning 6] will continue with Epogen during dialysis.   LOS: 2 days   Carrol Bondar S 03/06/2016,9:39 AM

## 2016-03-06 NOTE — Progress Notes (Signed)
Pt states he will do enema and dressing change at a later time. He says he is sleepy.

## 2016-03-06 NOTE — Progress Notes (Signed)
Pt refused enema, dressing change, and blood sugar this a.m.

## 2016-03-06 NOTE — Progress Notes (Signed)
ANTICOAGULATION CONSULT NOTE - Initial Consult  Pharmacy Consult for Heparin Indication: VTE treatment  No Known Allergies  Patient Measurements: Height: 5\' 11"  (180.3 cm) Weight: 252 lb 10.4 oz (114.6 kg) IBW/kg (Calculated) : 75.3 HEPARIN DW (KG): 101.6  Vital Signs: Temp: 98.1 F (36.7 C) (02/01 0650) Temp Source: Oral (02/01 0650) BP: 94/52 (02/01 0650) Pulse Rate: 102 (02/01 0650)  Labs:  Recent Labs  03/04/16 0604  03/04/16 1851 03/05/16 0458 03/06/16 0606  HGB 7.6*  < > 7.8* 7.6* 7.3*  HCT 23.5*  < > 23.0* 22.5* 22.4*  PLT 266  < > 294 275 312  LABPROT 37.4*  --   --  24.6* 17.5*  INR 3.68  --   --  2.18 1.42  CREATININE 17.23*  --   --  10.16* 9.08*  < > = values in this interval not displayed.  Estimated Creatinine Clearance: 12.8 mL/min (by C-G formula based on SCr of 9.08 mg/dL (H)).   Medical History: Past Medical History:  Diagnosis Date  . Anemia   . Arthritis   . BPH (benign prostatic hyperplasia)   . Chronic back pain   . Difficulty walking   . DVT (deep venous thrombosis) ([place])    Right popliteal DVT December 2017  . End-stage renal disease on hemodialysis ([place])    Dr. Lowanda Foster  . Erectile dysfunction   . Essential hypertension   . Gout   . Neuropathy, diabetic ([place])   . Type 2 diabetes mellitus ([place])   . Venous (peripheral) insufficiency     Medications:  Prescriptions Prior to Admission  Medication Sig Dispense Refill Last Dose  . clindamycin (CLEOCIN) 300 MG capsule Take 1 capsule (300 mg total) by mouth every 8 (eight) hours. 30 capsule 0 03/03/2016 at Unknown time  . colchicine 0.6 MG tablet 1/2 tab po 2 times /week (Patient taking differently: Take 0.3 mg by mouth daily as needed (for gout). ) 15 tablet 0 unknown  . gabapentin (NEURONTIN) 100 MG capsule Take 100 mg by mouth daily.    03/04/2016 at Unknown time  . Multiple Vitamin (DAILY VITE PO) Take 1 tablet by mouth daily.   unknown  . sevelamer carbonate (RENVELA) 800 MG tablet  Take 4 tablets (3,200 mg total) by mouth 3 (three) times daily with meals. 90 tablet 0 03/03/2016 at Unknown time  . sevelamer carbonate (RENVELA) 800 MG tablet Take 1 tablet (800 mg total) by mouth 2 (two) times daily between meals as needed (With snacks). 60 tablet 0 03/03/2016 at Unknown time  . sildenafil (REVATIO) 20 MG tablet Take 20 mg by mouth daily as needed. Dissolved under tongue (TROCHE) 60 minutes prior to sexual encounter   unknown  . traMADol (ULTRAM) 50 MG tablet Take 1 tablet (50 mg total) by mouth daily as needed (with dressing changes). 30 tablet 0 03/03/2016 at Unknown time  . ULORIC 80 MG TABS Take 80 mg by mouth daily.   03/03/2016 at Unknown time  . warfarin (COUMADIN) 5 MG tablet Take 2.5 mg by mouth daily. Take 1/2 tablet daily   Past Week at 1900    Assessment: 51 year old male with multiple medical problems including ESRD on HD,  recently discharge January 26 for scrotal abscess that was I/D. Presented to the ER 1/29 with an episode of significant bleeding from the wound in the context of ongoing anticoagulation for DVT.There is no ongoing bleeding. He was transfused and given Vit. K 1mg  on 1/30. Asked to restart anticoagulation today with heparin.  Goal of Therapy:  Anti-Xa level 0.3-7  Monitor platelets by anticoagulation protocol: Yes   Plan:  Give 4000 units bolus x 1 Start heparin infusion at 1600 units/hr Check anti-Xa level in 8 hours and daily while on heparin Continue to monitor H&H and platelets  Isac Sarna, BS Vena [place], BCPS Clinical Pharmacist Pager [phone] 03/06/2016,11:36 AM

## 2016-03-06 NOTE — Progress Notes (Signed)
PROGRESS NOTE  Jack Irwin OJJ:[id number] DOB: [birth year] DOA: 03/03/2016 PCP: Kingsley Callander, FNP  Brief Narrative: 51 year old male with multiple medical problems status post scrotal abscess incision and drainage with discharge January 26, with oral antibiotics and daily dressing changes/packing. Presented to the ER 1/29 with an episode of significant bleeding from the wound in the context of ongoing anticoagulation for DVT. Plans were made for observation, trending of hemoglobin, monitoring of INR and urology follow-up anticipated 1/30 in the morning.  Assessment/Plan 1. Bleeding from scrotal wound - improving now that INR is down.  Hg has been stable at 7.6, following, I changed dressing personally this morning and no bleeding seen.  INR now less than 2.  Start heparin for anticoagulation. Follow INR.    Pt to go for I&D per urology when INR less than 2 likely 2/2.   2. Acute blood loss anemia secondary to above. Hemoglobin has remained fairly stable.  Following.  3. Scrotal abscess status post incision and drainage prior to admission. Continue oral clindamycin. 4. Right lower extremity DVT December 2017. Holding warfarin for now.  Start heparin drip now that INR less than 2.   5. End-stage renal disease. Management per nephrology.  HD per nephrology team.  6. Diabetes mellitus type 2 with diabetic neuropathy.  BS well controlled.  7. Severe Constipation - Pt reporting no BM in last 9 days, He has refused enemas but he tells me he will do it today, I ordered KUB of abdomen to investigate further.  intensify laxatives, give enema now, repeat if needed, see orders.  8. Tachycardia - sinus tach suspect secondary to infection, following.    DVT prophylaxis: SCDs Code Status: full code Family Communication: wife at bedside Disposition Plan: home   03/06/2016, 2:03 PM  LOS: 2 days   Consultants:  Nephrology  Urology  Procedures:   I&D pending  Antimicrobials:  Clindamycin (on  prior to admission)  Interval history/Subjective: Pt refused the enemas yesterday and still has not had a BM.     Objective: Vitals:   03/05/16 1927 03/05/16 2225 03/06/16 0650 03/06/16 1348  BP:  96/60 (!) 94/52 101/72  Pulse:  (!) 116 (!) 102 (!) 116  Resp:  20 20 20   Temp:  98.7 F (37.1 C) 98.1 F (36.7 C) 98.4 F (36.9 C)  TempSrc:  Oral Oral Oral  SpO2: 94% 96% 96% 99%  Weight:      Height:       No intake or output data in the 24 hours ending 03/06/16 1403   Filed Weights   03/04/16 1650 03/05/16 0645 03/05/16 1000  Weight: 119.4 kg (263 lb 3.7 oz) 114.6 kg (252 lb 10.4 oz) 114.6 kg (252 lb 10.4 oz)    Exam:    Constitutional: Appears calm, comfortable. Cardiovascular regular rate and rhythm. No murmur, rub or gallop. Respiratory. Clear to auscultation bilaterally. No wheezes, rales or rhonchi. Normal respiratory effort. Abdomen: soft, no masses palpated, normal BS.  Scrotum appears swollen but no active bleeding seen.  Results for orders placed or performed during the hospital encounter of 03/03/16  CBC  Result Value Ref Range   WBC 12.5 (H) 4.0 - 10.5 K/uL   RBC 3.32 (L) 4.22 - 5.81 MIL/uL   Hemoglobin 9.6 (L) 13.0 - 17.0 g/dL   HCT 29.2 (L) 39.0 - 52.0 %   MCV 88.0 78.0 - 100.0 fL   MCH 28.9 26.0 - 34.0 pg   MCHC 32.9 30.0 - 36.0 g/dL  RDW 16.2 (H) 11.5 - 15.5 %   Platelets 270 150 - 400 K/uL  Protime-INR  Result Value Ref Range   Prothrombin Time 35.7 (H) 11.4 - 15.2 seconds   INR 6.30   Basic metabolic panel  Result Value Ref Range   Sodium 133 (L) 135 - 145 mmol/L   Potassium 4.9 3.5 - 5.1 mmol/L   Chloride 93 (L) 101 - 111 mmol/L   CO2 22 22 - 32 mmol/L   Glucose, Bld 77 65 - 99 mg/dL   BUN 98 (H) 6 - 20 mg/dL   Creatinine, Ser 15.50 (H) 0.61 - 1.24 mg/dL   Calcium 9.0 8.9 - 10.3 mg/dL   GFR calc non Af Amer 3 (L) >60 mL/min   GFR calc Af Amer 4 (L) >60 mL/min   Anion gap 18 (H) 5 - 15  Glucose, capillary  Result Value Ref Range    Glucose-Capillary 68 65 - 99 mg/dL   Comment 1 Notify RN    Comment 2 Document in Chart   Basic metabolic panel  Result Value Ref Range   Sodium 134 (L) 135 - 145 mmol/L   Potassium 5.4 (H) 3.5 - 5.1 mmol/L   Chloride 94 (L) 101 - 111 mmol/L   CO2 22 22 - 32 mmol/L   Glucose, Bld 92 65 - 99 mg/dL   BUN 112 (H) 6 - 20 mg/dL   Creatinine, Ser 17.23 (H) 0.61 - 1.24 mg/dL   Calcium 8.8 (L) 8.9 - 10.3 mg/dL   GFR calc non Af Amer 3 (L) >60 mL/min   GFR calc Af Amer 3 (L) >60 mL/min   Anion gap 18 (H) 5 - 15  CBC  Result Value Ref Range   WBC 12.2 (H) 4.0 - 10.5 K/uL   RBC 2.67 (L) 4.22 - 5.81 MIL/uL   Hemoglobin 7.6 (L) 13.0 - 17.0 g/dL   HCT 23.5 (L) 39.0 - 52.0 %   MCV 88.0 78.0 - 100.0 fL   MCH 28.5 26.0 - 34.0 pg   MCHC 32.3 30.0 - 36.0 g/dL   RDW 15.5 11.5 - 15.5 %   Platelets 266 150 - 400 K/uL  Protime-INR  Result Value Ref Range   Prothrombin Time 37.4 (H) 11.4 - 15.2 seconds   INR 3.68   Glucose, capillary  Result Value Ref Range   Glucose-Capillary 114 (H) 65 - 99 mg/dL   Comment 1 Notify RN    Comment 2 Document in Chart   Glucose, capillary  Result Value Ref Range   Glucose-Capillary 97 65 - 99 mg/dL   Comment 1 Notify RN    Comment 2 Document in Chart   CBC  Result Value Ref Range   WBC 13.4 (H) 4.0 - 10.5 K/uL   RBC 2.63 (L) 4.22 - 5.81 MIL/uL   Hemoglobin 7.8 (L) 13.0 - 17.0 g/dL   HCT 22.8 (L) 39.0 - 52.0 %   MCV 86.7 78.0 - 100.0 fL   MCH 29.7 26.0 - 34.0 pg   MCHC 34.2 30.0 - 36.0 g/dL   RDW 16.4 (H) 11.5 - 15.5 %   Platelets 258 150 - 400 K/uL  Glucose, capillary  Result Value Ref Range   Glucose-Capillary 89 65 - 99 mg/dL   Comment 1 Notify RN    Comment 2 Document in Chart   CBC  Result Value Ref Range   WBC 15.0 (H) 4.0 - 10.5 K/uL   RBC 2.65 (L) 4.22 - 5.81 MIL/uL   Hemoglobin  7.8 (L) 13.0 - 17.0 g/dL   HCT 23.0 (L) 39.0 - 52.0 %   MCV 86.8 78.0 - 100.0 fL   MCH 29.4 26.0 - 34.0 pg   MCHC 33.9 30.0 - 36.0 g/dL   RDW 16.3 (H) 11.5 -  15.5 %   Platelets 294 150 - 400 K/uL  Glucose, capillary  Result Value Ref Range   Glucose-Capillary 85 65 - 99 mg/dL   Comment 1 Notify RN    Comment 2 Document in Chart   Glucose, capillary  Result Value Ref Range   Glucose-Capillary 91 65 - 99 mg/dL   Comment 1 Notify RN    Comment 2 Document in Chart   CBC  Result Value Ref Range   WBC 14.2 (H) 4.0 - 10.5 K/uL   RBC 2.56 (L) 4.22 - 5.81 MIL/uL   Hemoglobin 7.6 (L) 13.0 - 17.0 g/dL   HCT 22.5 (L) 39.0 - 52.0 %   MCV 87.9 78.0 - 100.0 fL   MCH 29.7 26.0 - 34.0 pg   MCHC 33.8 30.0 - 36.0 g/dL   RDW 16.4 (H) 11.5 - 15.5 %   Platelets 275 150 - 400 K/uL  Renal function panel  Result Value Ref Range   Sodium 133 (L) 135 - 145 mmol/L   Potassium 4.7 3.5 - 5.1 mmol/L   Chloride 95 (L) 101 - 111 mmol/L   CO2 27 22 - 32 mmol/L   Glucose, Bld 132 (H) 65 - 99 mg/dL   BUN 50 (H) 6 - 20 mg/dL   Creatinine, Ser 10.16 (H) 0.61 - 1.24 mg/dL   Calcium 8.8 (L) 8.9 - 10.3 mg/dL   Phosphorus 6.7 (H) 2.5 - 4.6 mg/dL   Albumin 2.6 (L) 3.5 - 5.0 g/dL   GFR calc non Af Amer 5 (L) >60 mL/min   GFR calc Af Amer 6 (L) >60 mL/min   Anion gap 11 5 - 15  Protime-INR  Result Value Ref Range   Prothrombin Time 24.6 (H) 11.4 - 15.2 seconds   INR 2.18   Glucose, capillary  Result Value Ref Range   Glucose-Capillary 86 65 - 99 mg/dL   Comment 1 Notify RN    Comment 2 Document in Chart   Glucose, capillary  Result Value Ref Range   Glucose-Capillary 103 (H) 65 - 99 mg/dL  Glucose, capillary  Result Value Ref Range   Glucose-Capillary 107 (H) 65 - 99 mg/dL  CBC  Result Value Ref Range   WBC 18.5 (H) 4.0 - 10.5 K/uL   RBC 2.50 (L) 4.22 - 5.81 MIL/uL   Hemoglobin 7.3 (L) 13.0 - 17.0 g/dL   HCT 22.4 (L) 39.0 - 52.0 %   MCV 89.6 78.0 - 100.0 fL   MCH 29.2 26.0 - 34.0 pg   MCHC 32.6 30.0 - 36.0 g/dL   RDW 16.1 (H) 11.5 - 15.5 %   Platelets 312 150 - 400 K/uL  Renal function panel  Result Value Ref Range   Sodium 134 (L) 135 - 145 mmol/L     Potassium 4.5 3.5 - 5.1 mmol/L   Chloride 96 (L) 101 - 111 mmol/L   CO2 28 22 - 32 mmol/L   Glucose, Bld 91 65 - 99 mg/dL   BUN 42 (H) 6 - 20 mg/dL   Creatinine, Ser 9.08 (H) 0.61 - 1.24 mg/dL   Calcium 9.3 8.9 - 10.3 mg/dL   Phosphorus 7.3 (H) 2.5 - 4.6 mg/dL   Albumin 2.8 (L) 3.5 - 5.0  g/dL   GFR calc non Af Amer 6 (L) >60 mL/min   GFR calc Af Amer 7 (L) >60 mL/min   Anion gap 10 5 - 15  Protime-INR  Result Value Ref Range   Prothrombin Time 17.5 (H) 11.4 - 15.2 seconds   INR 1.42   Glucose, capillary  Result Value Ref Range   Glucose-Capillary 110 (H) 65 - 99 mg/dL  Glucose, capillary  Result Value Ref Range   Glucose-Capillary 90 65 - 99 mg/dL   Comment 1 Notify RN    Comment 2 Document in Chart   Glucose, capillary  Result Value Ref Range   Glucose-Capillary 85 65 - 99 mg/dL   Comment 1 Notify RN    Comment 2 Document in Chart   Glucose, capillary  Result Value Ref Range   Glucose-Capillary 90 65 - 99 mg/dL  Glucose, capillary  Result Value Ref Range   Glucose-Capillary 90 65 - 99 mg/dL  Type and screen [facility]  Result Value Ref Range   ABO/RH(D) A POS    Antibody Screen NEG    Sample Expiration 03/07/2016    Unit Number [id number]    Blood Component Type RED CELLS,LR    Unit division 00    Status of Unit ALLOCATED    Transfusion Status OK TO TRANSFUSE    Crossmatch Result Compatible    Unit Number [id number]    Blood Component Type RED CELLS,LR    Unit division 00    Status of Unit ALLOCATED    Transfusion Status OK TO TRANSFUSE    Crossmatch Result Compatible    Unit Number [id number]    Blood Component Type RED CELLS,LR    Unit division 00    Status of Unit ALLOCATED    Transfusion Status OK TO TRANSFUSE    Crossmatch Result Compatible    Unit Number [id number]    Blood Component Type RED CELLS,LR    Unit division 00    Status of Unit ALLOCATED    Transfusion Status OK TO TRANSFUSE    Crossmatch Result Compatible    Prepare RBC  Result Value Ref Range   Order Confirmation ORDER PROCESSED BY BLOOD BANK      Scheduled Meds: . sodium chloride   Intravenous Once  . clindamycin  300 mg Oral Q8H  . epoetin (EPOGEN/PROCRIT) injection  10,000 Units Intravenous Q T,Th,Sa-HD  . febuxostat  80 mg Oral Daily  . gabapentin  100 mg Oral Daily  . insulin aspart  0-15 Units Subcutaneous TID WC  . polyethylene glycol  34 g Oral BID  . senna-docusate  2 tablet Oral BID  . sevelamer carbonate  3,200 mg Oral TID WC   Continuous Infusions: . heparin 1,600 Units/hr (03/06/16 1249)   Principal Problem:   Bleeding from wound Active Problems:   Weakness of both legs   Arthritis, gouty   Hypertension   ESRD on dialysis ([place])   RBBB   Scrotal abscess   Type 2 diabetes mellitus (HCC)   Chronic anticoagulation   Constipation   Anemia in chronic kidney disease (CKD)   LOS: 2 days   Joncarlo Friberg 03/06/2016 2:03 PM

## 2016-03-07 LAB — PROTIME-INR
INR: 1.47
PROTHROMBIN TIME: 18 s — AB (ref 11.4–15.2)

## 2016-03-07 LAB — RENAL FUNCTION PANEL
Albumin: 3 g/dL — ABNORMAL LOW (ref 3.5–5.0)
Anion gap: 15 (ref 5–15)
BUN: 64 mg/dL — AB (ref 6–20)
CHLORIDE: 94 mmol/L — AB (ref 101–111)
CO2: 25 mmol/L (ref 22–32)
Calcium: 9.3 mg/dL (ref 8.9–10.3)
Creatinine, Ser: 12.39 mg/dL — ABNORMAL HIGH (ref 0.61–1.24)
GFR calc Af Amer: 5 mL/min — ABNORMAL LOW (ref >60)
GFR, EST NON AFRICAN AMERICAN: 4 mL/min — AB (ref >60)
Glucose, Bld: 96 mg/dL (ref 65–99)
POTASSIUM: 4.6 mmol/L (ref 3.5–5.1)
Phosphorus: 9.4 mg/dL — ABNORMAL HIGH (ref 2.5–4.6)
Sodium: 134 mmol/L — ABNORMAL LOW (ref 135–145)

## 2016-03-07 LAB — CBC
HEMATOCRIT: 22.2 % — AB (ref 39.0–52.0)
Hemoglobin: 7.2 g/dL — ABNORMAL LOW (ref 13.0–17.0)
MCH: 29.1 pg (ref 26.0–34.0)
MCHC: 32.4 g/dL (ref 30.0–36.0)
MCV: 89.9 fL (ref 78.0–100.0)
Platelets: 363 10*3/uL (ref 150–400)
RBC: 2.47 MIL/uL — ABNORMAL LOW (ref 4.22–5.81)
RDW: 16.3 % — ABNORMAL HIGH (ref 11.5–15.5)
WBC: 21.3 10*3/uL — AB (ref 4.0–10.5)

## 2016-03-07 LAB — GLUCOSE, CAPILLARY
GLUCOSE-CAPILLARY: 119 mg/dL — AB (ref 65–99)
GLUCOSE-CAPILLARY: 104 mg/dL — AB (ref 65–99)
Glucose-Capillary: 72 mg/dL (ref 65–99)

## 2016-03-07 LAB — HEPARIN LEVEL (UNFRACTIONATED): HEPARIN UNFRACTIONATED: 0.85 [IU]/mL — AB (ref 0.30–0.70)

## 2016-03-07 MED ORDER — SODIUM CHLORIDE 0.9 % IV SOLN
Freq: Once | INTRAVENOUS | Status: AC
  Administered 2016-03-07: 17:00:00 via INTRAVENOUS

## 2016-03-07 MED ORDER — ALTEPLASE 2 MG IJ SOLR
2.0000 mg | Freq: Once | INTRAMUSCULAR | Status: DC | PRN
  Filled 2016-03-07: qty 2

## 2016-03-07 MED ORDER — HEPARIN SODIUM (PORCINE) 1000 UNIT/ML DIALYSIS
1000.0000 [IU] | INTRAMUSCULAR | Status: DC | PRN
  Administered 2016-03-07: 4800 [IU] via INTRAVENOUS_CENTRAL
  Filled 2016-03-07 (×2): qty 1

## 2016-03-07 MED ORDER — EPOETIN ALFA 10000 UNIT/ML IJ SOLN
INTRAMUSCULAR | Status: AC
  Administered 2016-03-07: 10000 [IU] via INTRAVENOUS
  Filled 2016-03-07: qty 1

## 2016-03-07 MED ORDER — SODIUM CHLORIDE 0.9 % IV SOLN: 100.0000 mL | INTRAVENOUS | Status: DC | PRN

## 2016-03-07 MED ORDER — HEPARIN SODIUM (PORCINE) 1000 UNIT/ML IJ SOLN
INTRAMUSCULAR | Status: AC
  Administered 2016-03-07: 4800 [IU] via INTRAVENOUS_CENTRAL
  Filled 2016-03-07: qty 7

## 2016-03-07 MED ORDER — HEPARIN BOLUS VIA INFUSION
2000.0000 [IU] | Freq: Once | INTRAVENOUS | Status: AC
  Administered 2016-03-07: 2000 [IU] via INTRAVENOUS
  Filled 2016-03-07: qty 2000

## 2016-03-07 NOTE — Progress Notes (Addendum)
ANTICOAGULATION CONSULT NOTE   Pharmacy Consult for Heparin Indication: VTE treatment  No Known Allergies  Patient Measurements: Height: 5\' 11"  (180.3 cm) Weight: 252 lb 10.4 oz (114.6 kg) IBW/kg (Calculated) : 75.3 HEPARIN DW (KG): 101.6  Vital Signs: Temp: 98.4 F (36.9 C) (02/02 0654) Temp Source: Oral (02/02 0654) BP: 102/74 (02/02 0654) Pulse Rate: 108 (02/02 0654)  Labs:  Recent Labs  03/05/16 0458 03/06/16 0606 03/06/16 1951 03/07/16 0544  HGB 7.6* 7.3*  --  7.2*  HCT 22.5* 22.4*  --  22.2*  PLT 275 312  --  363  LABPROT 24.6* 17.5*  --  18.0*  INR 2.18 1.42  --  1.47  HEPARINUNFRC  --   --  <0.10* <0.10*  CREATININE 10.16* 9.08*  --  12.39*    Estimated Creatinine Clearance: 9.4 mL/min (by C-G formula based on SCr of 12.39 mg/dL (H)).   Medical History: Past Medical History:  Diagnosis Date  . Anemia   . Arthritis   . BPH (benign prostatic hyperplasia)   . Chronic back pain   . Difficulty walking   . DVT (deep venous thrombosis) ([place])    Right popliteal DVT December 2017  . End-stage renal disease on hemodialysis ([place])    Dr. Lowanda Foster  . Erectile dysfunction   . Essential hypertension   . Gout   . Neuropathy, diabetic ([place])   . Type 2 diabetes mellitus ([place])   . Venous (peripheral) insufficiency     Medications:  Prescriptions Prior to Admission  Medication Sig Dispense Refill Last Dose  . clindamycin (CLEOCIN) 300 MG capsule Take 1 capsule (300 mg total) by mouth every 8 (eight) hours. 30 capsule 0 03/03/2016 at Unknown time  . colchicine 0.6 MG tablet 1/2 tab po 2 times /week (Patient taking differently: Take 0.3 mg by mouth daily as needed (for gout). ) 15 tablet 0 unknown  . gabapentin (NEURONTIN) 100 MG capsule Take 100 mg by mouth daily.    03/04/2016 at Unknown time  . Multiple Vitamin (DAILY VITE PO) Take 1 tablet by mouth daily.   unknown  . sevelamer carbonate (RENVELA) 800 MG tablet Take 4 tablets (3,200 mg total)  by mouth 3 (three) times daily with meals. 90 tablet 0 03/03/2016 at Unknown time  . sevelamer carbonate (RENVELA) 800 MG tablet Take 1 tablet (800 mg total) by mouth 2 (two) times daily between meals as needed (With snacks). 60 tablet 0 03/03/2016 at Unknown time  . sildenafil (REVATIO) 20 MG tablet Take 20 mg by mouth daily as needed. Dissolved under tongue (TROCHE) 60 minutes prior to sexual encounter   unknown  . traMADol (ULTRAM) 50 MG tablet Take 1 tablet (50 mg total) by mouth daily as needed (with dressing changes). 30 tablet 0 03/03/2016 at Unknown time  . ULORIC 80 MG TABS Take 80 mg by mouth daily.   03/03/2016 at Unknown time  . warfarin (COUMADIN) 5 MG tablet Take 2.5 mg by mouth daily. Take 1/2 tablet daily   Past Week at 1900    Assessment: 51 year old male with multiple medical problems including ESRD on HD,  recently discharge January 26 for scrotal abscess that was I/D. Presented to the ER 1/29 with an episode of significant bleeding from the wound in the context of ongoing anticoagulation for DVT.There is no ongoing bleeding. He was transfused and given Vit. K 1mg  on 1/30. Asked to restart anticoagulation with heparin.  Heparin level remains <0.1 units/ml Goal of Therapy:  Anti-Xa level 0.3-7  Monitor platelets by anticoagulation protocol: Yes   Plan:  Give 2000 units bolus x 1 Increase heparin infusion to 2200 units/hr Check anti-Xa level in 8 hours and daily while on heparin Continue to monitor H&H and platelets  Thanks for allowing pharmacy to be a part of this patient's care.  Excell Seltzer, PharmD Clinical Pharmacist 03/07/2016,8:08 AM   Addum:  Heparin level now 0.84 units/ml.  Wil decrease drip to 2100 units/hr.  F/u am labs

## 2016-03-07 NOTE — Progress Notes (Signed)
Assessment and Plan: 1.  Scrotal abscess with bleeding on anticoagulation.    The bleeding has improved.    I probed the wound deeply and there was minimal residual pus, but he has a prominent phlegmon.   I packed the wound and that will need to be done daily.  I don't think he needs to go back to the OR at this time but will follow closely over the weekend.   Continue antibiotic therapy.   Subjective: Jack Irwin has a right scrotal abscess that was drained on 1/26.  He was readmitted with anemia from scrotal bleeding in the face of anticoagulation.  He reports persistent swelling but not much pain.  The bleeding has resolved with normalization of the INR.   He continues to have fever and his WBC is up.    ROS:  Review of Systems  Constitutional: Positive for fever.  All other systems reviewed and are negative.   Anti-infectives: Anti-infectives    Start     Dose/Rate Route Frequency Ordered Stop   03/03/16 2215  clindamycin (CLEOCIN) capsule 300 mg     300 mg Oral Every 8 hours 03/03/16 2204        Current Facility-Administered Medications  Medication Dose Route Frequency Provider Last Rate Last Dose  . 0.9 %  sodium chloride infusion  100 mL Intravenous PRN Fran Lowes, MD      . 0.9 %  sodium chloride infusion  100 mL Intravenous PRN Fran Lowes, MD      . 0.9 %  sodium chloride infusion   Intravenous Once Samuella Cota, MD      . 0.9 %  sodium chloride infusion  100 mL Intravenous PRN Fran Lowes, MD      . 0.9 %  sodium chloride infusion  100 mL Intravenous PRN Fran Lowes, MD      . acetaminophen (TYLENOL) tablet 650 mg  650 mg Oral Q6H PRN Karmen Bongo, MD       Or  . acetaminophen (TYLENOL) suppository 650 mg  650 mg Rectal Q6H PRN Karmen Bongo, MD      . alteplase (CATHFLO ACTIVASE) injection 2 mg  2 mg Intracatheter Once PRN Fran Lowes, MD      . alteplase (CATHFLO ACTIVASE) injection 2 mg  2 mg Intracatheter Once PRN Fran Lowes, MD      . camphor-menthol Timoteo Ace) lotion 1 application  1 application Topical L8X PRN Karmen Bongo, MD       And  . hydrOXYzine (ATARAX/VISTARIL) tablet 25 mg  25 mg Oral Q8H PRN Karmen Bongo, MD      . clindamycin (CLEOCIN) capsule 300 mg  300 mg Oral Q8H Karmen Bongo, MD   300 mg at 03/07/16 1700  . epoetin alfa (EPOGEN,PROCRIT) injection 10,000 Units  10,000 Units Intravenous Q T,Th,Sa-HD Fran Lowes, MD   10,000 Units at 03/07/16 1203  . febuxostat (ULORIC) tablet 80 mg  80 mg Oral Daily Samuella Cota, MD   80 mg at 03/07/16 1659  . feeding supplement (NEPRO CARB STEADY) liquid 237 mL  237 mL Oral TID PRN Karmen Bongo, MD      . gabapentin (NEURONTIN) capsule 100 mg  100 mg Oral Daily Karmen Bongo, MD   100 mg at 03/07/16 1700  . guaiFENesin-dextromethorphan (ROBITUSSIN DM) 100-10 MG/5ML syrup 5 mL  5 mL Oral Q4H PRN Samuella Cota, MD   5 mL at 03/04/16 1248  . heparin ADULT infusion 100 units/mL (25000 units/25mL sodium chloride  0.45%)  2,100 Units/hr Intravenous Continuous Clanford Marisa Hua, MD 21 mL/hr at 03/07/16 1821 2,100 Units/hr at 03/07/16 1821  . heparin injection 1,000 Units  1,000 Units Dialysis PRN Fran Lowes, MD   1,000 Units at 03/05/16 1229  . heparin injection 1,000 Units  1,000 Units Dialysis PRN Fran Lowes, MD   4,800 Units at 03/07/16 1400  . insulin aspart (novoLOG) injection 0-15 Units  0-15 Units Subcutaneous TID WC Karmen Bongo, MD      . lidocaine (PF) (XYLOCAINE) 1 % injection 5 mL  5 mL Intradermal PRN Fran Lowes, MD      . lidocaine-prilocaine (EMLA) cream 1 application  1 application Topical PRN Fran Lowes, MD      . ondansetron (ZOFRAN) tablet 4 mg  4 mg Oral Q6H PRN Karmen Bongo, MD       Or  . ondansetron [facility]) injection 4 mg  4 mg Intravenous Q6H PRN Karmen Bongo, MD   4 mg at 03/06/16 6440  . pentafluoroprop-tetrafluoroeth (GEBAUERS) aerosol 1 application  1 application Topical PRN  Fran Lowes, MD      . polyethylene glycol (MIRALAX / GLYCOLAX) packet 34 g  34 g Oral BID Clanford Marisa Hua, MD   34 g at 03/06/16 1810  . senna-docusate (Senokot-S) tablet 2 tablet  2 tablet Oral BID Clanford Marisa Hua, MD   2 tablet at 03/07/16 1659  . sevelamer carbonate (RENVELA) tablet 3,200 mg  3,200 mg Oral TID WC Karmen Bongo, MD   3,200 mg at 03/07/16 1700  . sevelamer carbonate (RENVELA) tablet 800 mg  800 mg Oral BID BM PRN Karmen Bongo, MD      . sorbitol 70 % solution 30 mL  30 mL Oral PRN Karmen Bongo, MD      . sorbitol, milk of mag, mineral oil, glycerin (SMOG) enema  960 mL Rectal Once PRN Clanford Marisa Hua, MD      . traMADol Veatrice Bourbon) tablet 50 mg  50 mg Oral Daily PRN Karmen Bongo, MD   50 mg at 03/07/16 1700     Objective: Vital signs in last 24 hours: Temp:  [98.3 F (36.8 C)-98.9 F (37.2 C)] 98.8 F (37.1 C) (02/02 1530) Pulse Rate:  [96-112] 102 (02/02 1530) Resp:  [16-20] 20 (02/02 1530) BP: (88-128)/(33-74) 112/55 (02/02 1530) SpO2:  [92 %-100 %] 100 % (02/02 1120) Weight:  [113.9 kg (251 lb 1.7 oz)] 113.9 kg (251 lb 1.7 oz) (02/02 1120)  Intake/Output from previous day: 02/01 0701 - 02/02 0700 In: 240 [P.O.:240] Out: 100 [Urine:100] Intake/Output this shift: No intake/output data recorded.   Physical Exam  Constitutional: He is oriented to person, place, and time and well-developed, well-nourished, and in no distress.  Abdominal: Soft. There is no tenderness.  Genitourinary:  Genitourinary Comments: He has a persistent phlegmon in the right scrotum and inguinal area without fluctuance or active bleeding from the prior I& D site.   The left testicle and epididymis are normal.   There is no streaking on the surrounding skin.    I probed the left I&D incision with a gloved finger up into the inguinal area and opened the tract, but there was no residual pus, only foul smelling dark blood.    A 2 inch saline soaked kling was used as a  packing and a dressing with 4 X 4's was applied.   Musculoskeletal: Normal range of motion.  Neurological: He is alert and oriented to person, place, and time.  Skin: Skin is  warm and dry.    Lab Results:   Recent Labs  03/06/16 0606 03/07/16 0544  WBC 18.5* 21.3*  HGB 7.3* 7.2*  HCT 22.4* 22.2*  PLT 312 363   BMET  Recent Labs  03/06/16 0606 03/07/16 0544  NA 134* 134*  K 4.5 4.6  CL 96* 94*  CO2 28 25  GLUCOSE 91 96  BUN 42* 64*  CREATININE 9.08* 12.39*  CALCIUM 9.3 9.3   PT/INR  Recent Labs  03/06/16 0606 03/07/16 0544  LABPROT 17.5* 18.0*  INR 1.42 1.47   ABG No results for input(s): PHART, HCO3 in the last 72 hours.  Invalid input(s): PCO2, PO2  Studies/Results: Dg Abd Portable 1v  Result Date: 03/06/2016 CLINICAL DATA:  Constipation . EXAM: PORTABLE ABDOMEN - 1 VIEW COMPARISON:  CT 02/25/2016.  CT 11/01/2015. FINDINGS: Soft tissue structures are unremarkable. Prominent amount of stool noted throughout the colon suggesting constipation. No prominent bowel distention. No free air. Degenerative changes thoracolumbar spine IMPRESSION: 1. Prominent amount of stool noted throughout the colon consistent with constipation. 2.  Degenerative changes thoracolumbar spine. Electronically Signed   By: Marcello Moores  Register   On: 03/06/2016 09:18           LOS: 3 days    Irine Seal J 03/07/2016 [id number] ID: Jack Irwin, male   DOB: April 19, 1965, 51 y.o.   MRN: [id number]

## 2016-03-07 NOTE — Progress Notes (Signed)
PROGRESS NOTE  Jack Irwin XBD:[id number] DOB: 10-Sep-1965 DOA: 03/03/2016 PCP: Kingsley Callander, FNP  Brief Narrative: 51 year old male with multiple medical problems status post scrotal abscess incision and drainage with discharge January 26, with oral antibiotics and daily dressing changes/packing. Presented to the ER 1/29 with an episode of significant bleeding from the wound in the context of ongoing anticoagulation for DVT. Plans were made for observation, trending of hemoglobin, monitoring of INR and urology follow-up anticipated 1/30 in the morning.  Assessment/Plan 1. Bleeding from scrotal wound - improving now that INR is down.  Hg has been stable at 7, following, I changed dressing personally this morning and no bleeding seen.  INR now less than 2.  On heparin for anticoagulation. Follow INR.    Pt to go for I&D per urology when INR less than 2 likely 2/2.   2. Acute blood loss anemia secondary to above. Hemoglobin has remained fairly stable.  Following.  3. Scrotal abscess status post incision and drainage prior to admission. Continue oral clindamycin. 4. Right lower extremity DVT December 2017. Holding warfarin for now.  Start heparin drip now that INR less than 2.   5. End-stage renal disease. Management per nephrology.  HD per nephrology team.  6. Diabetes mellitus type 2 with diabetic neuropathy.  BS well controlled.  7. Severe Constipation - Pt had large BMs after enema yesterday, continue laxatives.  8. Tachycardia - sinus tach suspect secondary to infection, following. 9. IV infiltration RUE - continue warm compresses and elevation.    10. Leukocytosis - likely secondary to infection, pending I&D per urology possibly today.   DVT prophylaxis: SCDs heparin Code Status: full code Family Communication: wife at bedside Disposition Plan: home   03/07/2016, 9:46 AM  LOS: 3 days   Consultants:  Nephrology  Urology  Procedures:   I&D  ?  Antimicrobials:  Clindamycin (on prior to admission)  Interval history/Subjective: Pt has had large BM yesterday.   Complaining of pain RUE from IV infiltration  Objective: Vitals:   03/06/16 1348 03/06/16 1938 03/06/16 2235 03/07/16 0654  BP: 101/72  101/65 102/74  Pulse: (!) 116  (!) 107 (!) 108  Resp: 20  20 20   Temp: 98.4 F (36.9 C)  98.8 F (37.1 C) 98.4 F (36.9 C)  TempSrc: Oral  Oral Oral  SpO2: 99% 92% 95% 100%  Weight:      Height:        Intake/Output Summary (Last 24 hours) at 03/07/16 0946 Last data filed at 03/07/16 0654  Gross per 24 hour  Intake              240 ml  Output              100 ml  Net              140 ml     Filed Weights   03/04/16 1650 03/05/16 0645 03/05/16 1000  Weight: 119.4 kg (263 lb 3.7 oz) 114.6 kg (252 lb 10.4 oz) 114.6 kg (252 lb 10.4 oz)    Exam:    Constitutional: Appears calm, comfortable. Cardiovascular regular rate and rhythm. No murmur, rub or gallop. Respiratory. Clear to auscultation bilaterally. No wheezes, rales or rhonchi. Normal respiratory effort. Abdomen: soft, no masses palpated, normal BS.  Scrotum appears swollen but no active bleeding seen. Ext: swollen RUE from IV infiltration, tender to palpation.  Results for orders placed or performed during the hospital encounter of 03/03/16  CBC  Result  Value Ref Range   WBC 12.5 (H) 4.0 - 10.5 K/uL   RBC 3.32 (L) 4.22 - 5.81 MIL/uL   Hemoglobin 9.6 (L) 13.0 - 17.0 g/dL   HCT 29.2 (L) 39.0 - 52.0 %   MCV 88.0 78.0 - 100.0 fL   MCH 28.9 26.0 - 34.0 pg   MCHC 32.9 30.0 - 36.0 g/dL   RDW 16.2 (H) 11.5 - 15.5 %   Platelets 270 150 - 400 K/uL  Protime-INR  Result Value Ref Range   Prothrombin Time 35.7 (H) 11.4 - 15.2 seconds   INR 7.67   Basic metabolic panel  Result Value Ref Range   Sodium 133 (L) 135 - 145 mmol/L   Potassium 4.9 3.5 - 5.1 mmol/L   Chloride 93 (L) 101 - 111 mmol/L   CO2 22 22 - 32 mmol/L   Glucose, Bld 77 65 - 99 mg/dL   BUN 98 (H)  6 - 20 mg/dL   Creatinine, Ser 15.50 (H) 0.61 - 1.24 mg/dL   Calcium 9.0 8.9 - 10.3 mg/dL   GFR calc non Af Amer 3 (L) >60 mL/min   GFR calc Af Amer 4 (L) >60 mL/min   Anion gap 18 (H) 5 - 15  Glucose, capillary  Result Value Ref Range   Glucose-Capillary 68 65 - 99 mg/dL   Comment 1 Notify RN    Comment 2 Document in Chart   Basic metabolic panel  Result Value Ref Range   Sodium 134 (L) 135 - 145 mmol/L   Potassium 5.4 (H) 3.5 - 5.1 mmol/L   Chloride 94 (L) 101 - 111 mmol/L   CO2 22 22 - 32 mmol/L   Glucose, Bld 92 65 - 99 mg/dL   BUN 112 (H) 6 - 20 mg/dL   Creatinine, Ser 17.23 (H) 0.61 - 1.24 mg/dL   Calcium 8.8 (L) 8.9 - 10.3 mg/dL   GFR calc non Af Amer 3 (L) >60 mL/min   GFR calc Af Amer 3 (L) >60 mL/min   Anion gap 18 (H) 5 - 15  CBC  Result Value Ref Range   WBC 12.2 (H) 4.0 - 10.5 K/uL   RBC 2.67 (L) 4.22 - 5.81 MIL/uL   Hemoglobin 7.6 (L) 13.0 - 17.0 g/dL   HCT 23.5 (L) 39.0 - 52.0 %   MCV 88.0 78.0 - 100.0 fL   MCH 28.5 26.0 - 34.0 pg   MCHC 32.3 30.0 - 36.0 g/dL   RDW 15.5 11.5 - 15.5 %   Platelets 266 150 - 400 K/uL  Protime-INR  Result Value Ref Range   Prothrombin Time 37.4 (H) 11.4 - 15.2 seconds   INR 3.68   Glucose, capillary  Result Value Ref Range   Glucose-Capillary 114 (H) 65 - 99 mg/dL   Comment 1 Notify RN    Comment 2 Document in Chart   Glucose, capillary  Result Value Ref Range   Glucose-Capillary 97 65 - 99 mg/dL   Comment 1 Notify RN    Comment 2 Document in Chart   CBC  Result Value Ref Range   WBC 13.4 (H) 4.0 - 10.5 K/uL   RBC 2.63 (L) 4.22 - 5.81 MIL/uL   Hemoglobin 7.8 (L) 13.0 - 17.0 g/dL   HCT 22.8 (L) 39.0 - 52.0 %   MCV 86.7 78.0 - 100.0 fL   MCH 29.7 26.0 - 34.0 pg   MCHC 34.2 30.0 - 36.0 g/dL   RDW 16.4 (H) 11.5 - 15.5 %  Platelets 258 150 - 400 K/uL  Glucose, capillary  Result Value Ref Range   Glucose-Capillary 89 65 - 99 mg/dL   Comment 1 Notify RN    Comment 2 Document in Chart   CBC  Result Value Ref Range    WBC 15.0 (H) 4.0 - 10.5 K/uL   RBC 2.65 (L) 4.22 - 5.81 MIL/uL   Hemoglobin 7.8 (L) 13.0 - 17.0 g/dL   HCT 23.0 (L) 39.0 - 52.0 %   MCV 86.8 78.0 - 100.0 fL   MCH 29.4 26.0 - 34.0 pg   MCHC 33.9 30.0 - 36.0 g/dL   RDW 16.3 (H) 11.5 - 15.5 %   Platelets 294 150 - 400 K/uL  Glucose, capillary  Result Value Ref Range   Glucose-Capillary 85 65 - 99 mg/dL   Comment 1 Notify RN    Comment 2 Document in Chart   Glucose, capillary  Result Value Ref Range   Glucose-Capillary 91 65 - 99 mg/dL   Comment 1 Notify RN    Comment 2 Document in Chart   CBC  Result Value Ref Range   WBC 14.2 (H) 4.0 - 10.5 K/uL   RBC 2.56 (L) 4.22 - 5.81 MIL/uL   Hemoglobin 7.6 (L) 13.0 - 17.0 g/dL   HCT 22.5 (L) 39.0 - 52.0 %   MCV 87.9 78.0 - 100.0 fL   MCH 29.7 26.0 - 34.0 pg   MCHC 33.8 30.0 - 36.0 g/dL   RDW 16.4 (H) 11.5 - 15.5 %   Platelets 275 150 - 400 K/uL  Renal function panel  Result Value Ref Range   Sodium 133 (L) 135 - 145 mmol/L   Potassium 4.7 3.5 - 5.1 mmol/L   Chloride 95 (L) 101 - 111 mmol/L   CO2 27 22 - 32 mmol/L   Glucose, Bld 132 (H) 65 - 99 mg/dL   BUN 50 (H) 6 - 20 mg/dL   Creatinine, Ser 10.16 (H) 0.61 - 1.24 mg/dL   Calcium 8.8 (L) 8.9 - 10.3 mg/dL   Phosphorus 6.7 (H) 2.5 - 4.6 mg/dL   Albumin 2.6 (L) 3.5 - 5.0 g/dL   GFR calc non Af Amer 5 (L) >60 mL/min   GFR calc Af Amer 6 (L) >60 mL/min   Anion gap 11 5 - 15  Protime-INR  Result Value Ref Range   Prothrombin Time 24.6 (H) 11.4 - 15.2 seconds   INR 2.18   Glucose, capillary  Result Value Ref Range   Glucose-Capillary 86 65 - 99 mg/dL   Comment 1 Notify RN    Comment 2 Document in Chart   Glucose, capillary  Result Value Ref Range   Glucose-Capillary 103 (H) 65 - 99 mg/dL  Glucose, capillary  Result Value Ref Range   Glucose-Capillary 107 (H) 65 - 99 mg/dL  CBC  Result Value Ref Range   WBC 18.5 (H) 4.0 - 10.5 K/uL   RBC 2.50 (L) 4.22 - 5.81 MIL/uL   Hemoglobin 7.3 (L) 13.0 - 17.0 g/dL   HCT 22.4 (L)  39.0 - 52.0 %   MCV 89.6 78.0 - 100.0 fL   MCH 29.2 26.0 - 34.0 pg   MCHC 32.6 30.0 - 36.0 g/dL   RDW 16.1 (H) 11.5 - 15.5 %   Platelets 312 150 - 400 K/uL  Renal function panel  Result Value Ref Range   Sodium 134 (L) 135 - 145 mmol/L   Potassium 4.5 3.5 - 5.1 mmol/L   Chloride 96 (L) 101 -  111 mmol/L   CO2 28 22 - 32 mmol/L   Glucose, Bld 91 65 - 99 mg/dL   BUN 42 (H) 6 - 20 mg/dL   Creatinine, Ser 9.08 (H) 0.61 - 1.24 mg/dL   Calcium 9.3 8.9 - 10.3 mg/dL   Phosphorus 7.3 (H) 2.5 - 4.6 mg/dL   Albumin 2.8 (L) 3.5 - 5.0 g/dL   GFR calc non Af Amer 6 (L) >60 mL/min   GFR calc Af Amer 7 (L) >60 mL/min   Anion gap 10 5 - 15  Protime-INR  Result Value Ref Range   Prothrombin Time 17.5 (H) 11.4 - 15.2 seconds   INR 1.42   Glucose, capillary  Result Value Ref Range   Glucose-Capillary 110 (H) 65 - 99 mg/dL  Glucose, capillary  Result Value Ref Range   Glucose-Capillary 90 65 - 99 mg/dL   Comment 1 Notify RN    Comment 2 Document in Chart   Glucose, capillary  Result Value Ref Range   Glucose-Capillary 85 65 - 99 mg/dL   Comment 1 Notify RN    Comment 2 Document in Chart   Glucose, capillary  Result Value Ref Range   Glucose-Capillary 90 65 - 99 mg/dL  Glucose, capillary  Result Value Ref Range   Glucose-Capillary 90 65 - 99 mg/dL  Heparin level (unfractionated)  Result Value Ref Range   Heparin Unfractionated <0.10 (L) 0.30 - 0.70 IU/mL  Glucose, capillary  Result Value Ref Range   Glucose-Capillary 106 (H) 65 - 99 mg/dL  CBC  Result Value Ref Range   WBC 21.3 (H) 4.0 - 10.5 K/uL   RBC 2.47 (L) 4.22 - 5.81 MIL/uL   Hemoglobin 7.2 (L) 13.0 - 17.0 g/dL   HCT 22.2 (L) 39.0 - 52.0 %   MCV 89.9 78.0 - 100.0 fL   MCH 29.1 26.0 - 34.0 pg   MCHC 32.4 30.0 - 36.0 g/dL   RDW 16.3 (H) 11.5 - 15.5 %   Platelets 363 150 - 400 K/uL  Renal function panel  Result Value Ref Range   Sodium 134 (L) 135 - 145 mmol/L   Potassium 4.6 3.5 - 5.1 mmol/L   Chloride 94 (L) 101 - 111  mmol/L   CO2 25 22 - 32 mmol/L   Glucose, Bld 96 65 - 99 mg/dL   BUN 64 (H) 6 - 20 mg/dL   Creatinine, Ser 12.39 (H) 0.61 - 1.24 mg/dL   Calcium 9.3 8.9 - 10.3 mg/dL   Phosphorus 9.4 (H) 2.5 - 4.6 mg/dL   Albumin 3.0 (L) 3.5 - 5.0 g/dL   GFR calc non Af Amer 4 (L) >60 mL/min   GFR calc Af Amer 5 (L) >60 mL/min   Anion gap 15 5 - 15  Protime-INR  Result Value Ref Range   Prothrombin Time 18.0 (H) 11.4 - 15.2 seconds   INR 1.47   Heparin level (unfractionated)  Result Value Ref Range   Heparin Unfractionated <0.10 (L) 0.30 - 0.70 IU/mL  Glucose, capillary  Result Value Ref Range   Glucose-Capillary 107 (H) 65 - 99 mg/dL   Comment 1 Notify RN    Comment 2 Document in Chart   Glucose, capillary  Result Value Ref Range   Glucose-Capillary 72 65 - 99 mg/dL  Type and screen [facility]  Result Value Ref Range   ABO/RH(D) A POS    Antibody Screen NEG    Sample Expiration 03/07/2016    Unit Number [id number]  Blood Component Type RED CELLS,LR    Unit division 00    Status of Unit ALLOCATED    Transfusion Status OK TO TRANSFUSE    Crossmatch Result Compatible    Unit Number [id number]    Blood Component Type RED CELLS,LR    Unit division 00    Status of Unit ALLOCATED    Transfusion Status OK TO TRANSFUSE    Crossmatch Result Compatible    Unit Number [id number]    Blood Component Type RED CELLS,LR    Unit division 00    Status of Unit ALLOCATED    Transfusion Status OK TO TRANSFUSE    Crossmatch Result Compatible    Unit Number [id number]    Blood Component Type RED CELLS,LR    Unit division 00    Status of Unit ALLOCATED    Transfusion Status OK TO TRANSFUSE    Crossmatch Result Compatible   Prepare RBC  Result Value Ref Range   Order Confirmation ORDER PROCESSED BY BLOOD BANK      Scheduled Meds: . sodium chloride   Intravenous Once  . sodium chloride   Intravenous Once  . clindamycin  300 mg Oral Q8H  . epoetin (EPOGEN/PROCRIT)  injection  10,000 Units Intravenous Q T,Th,Sa-HD  . febuxostat  80 mg Oral Daily  . gabapentin  100 mg Oral Daily  . insulin aspart  0-15 Units Subcutaneous TID WC  . polyethylene glycol  34 g Oral BID  . senna-docusate  2 tablet Oral BID  . sevelamer carbonate  3,200 mg Oral TID WC   Continuous Infusions: . heparin 2,200 Units/hr (03/07/16 7169)   Principal Problem:   Bleeding from wound Active Problems:   Weakness of both legs   Arthritis, gouty   Hypertension   ESRD on dialysis [facility])   RBBB   Scrotal abscess   Type 2 diabetes mellitus (HCC)   Chronic anticoagulation   Constipation   Anemia in chronic kidney disease (CKD)   LOS: 3 days   Jack Irwin 03/07/2016 9:46 AM

## 2016-03-07 NOTE — Progress Notes (Signed)
Subjective: Interval History: The patient offers no complaints. He denies any nausea or vomiting. Denies also any difficulty breathing.  Objective: Vital signs in last 24 hours: Temp:  [98.4 F (36.9 C)-98.8 F (37.1 C)] 98.4 F (36.9 C) (02/02 0654) Pulse Rate:  [107-116] 108 (02/02 0654) Resp:  [20] 20 (02/02 0654) BP: (101-102)/(65-74) 102/74 (02/02 0654) SpO2:  [92 %-100 %] 100 % (02/02 0654) Weight change:   Intake/Output from previous day: 02/01 0701 - 02/02 0700 In: 240 [P.O.:240] Out: 100 [Urine:100] Intake/Output this shift: No intake/output data recorded.  General appearance: alert, cooperative and no distress Resp: clear to auscultation bilaterally Cardio: regular rate and rhythm Male genitalia: Patient with scrotal swelling and edema. Extremities: venous stasis dermatitis noted  Lab Results:  Recent Labs  03/06/16 0606 03/07/16 0544  WBC 18.5* 21.3*  HGB 7.3* 7.2*  HCT 22.4* 22.2*  PLT 312 363   BMET:   Recent Labs  03/06/16 0606 03/07/16 0544  NA 134* 134*  K 4.5 4.6  CL 96* 94*  CO2 28 25  GLUCOSE 91 96  BUN 42* 64*  CREATININE 9.08* 12.39*  CALCIUM 9.3 9.3   No results for input(s): PTH in the last 72 hours. Iron Studies: No results for input(s): IRON, TIBC, TRANSFERRIN, FERRITIN in the last 72 hours.  Studies/Results: Dg Abd Portable 1v  Result Date: 03/06/2016 CLINICAL DATA:  Constipation . EXAM: PORTABLE ABDOMEN - 1 VIEW COMPARISON:  CT 02/25/2016.  CT 11/01/2015. FINDINGS: Soft tissue structures are unremarkable. Prominent amount of stool noted throughout the colon suggesting constipation. No prominent bowel distention. No free air. Degenerative changes thoracolumbar spine IMPRESSION: 1. Prominent amount of stool noted throughout the colon consistent with constipation. 2.  Degenerative changes thoracolumbar spine. Electronically Signed   By: Marcello Moores  Register   On: 03/06/2016 09:18    I have reviewed the patient's current  medications.  Assessment/Plan: Problem #1 scrotal abscess: Status D&C. Patient afebrile but his white blood cell count is high. Presently is on antibiotics. Problem #2 end-stage renal disease: Status post hemodialysis on Wednesday. Presently he doesn't have any uremic signs and symptoms. Problem #3 anemia: Possibly a combination of bleeding and anemia of chronic disease. His hemoglobin is low but stable. Problem #4 metabolic bone disease: His calcium is range but phosphorus is high. Patient is started on Epogen. Problem #5 fluid management: Presently he doesn't have any sign of fluid overload. Problem #6 history of cardiomyopathy Problem #7 history of obesity Plan: 1] we'll make arrangements for patient to get dialysis today 2] we'll hold heparin 3] we'll try to remove 2 liters if sbp >00 systolic blood pressure remains above 90 4] we'll continue his Epogen.   LOS: 3 days   Donae Kueker S 03/07/2016,8:13 AM

## 2016-03-07 NOTE — Progress Notes (Signed)
PT Cancellation Note  Patient Details Name: CLOYCE BLANKENHORN MRN: [id number] DOB: [birth year]   Cancelled Treatment:    Reason Eval/Treat Not Completed: Patient at procedure or test/unavailable   Beth Annalucia Laino, PT, DPT X: [phone]

## 2016-03-07 NOTE — Procedures (Signed)
   HEMODIALYSIS TREATMENT NOTE:  4 hour heparin-free dialysis completed via left IJ tunneled PC. Exit site unremarkable. Goal NOT met: Unable to tolerate removal of 2L as ordered due to hypotension.  UF was interrupted for 2 hours for SBP<90.  Net UF 1.1 liters. One unit PRBCs was transfused with HD.  All blood was returned.  Report given to Janace Aris, RN.  Rockwell Alexandria, RN, CDN

## 2016-03-08 DIAGNOSIS — D72829 Elevated white blood cell count, unspecified: Secondary | ICD-10-CM

## 2016-03-08 LAB — RENAL FUNCTION PANEL
ALBUMIN: 2.7 g/dL — AB (ref 3.5–5.0)
Anion gap: 10 (ref 5–15)
BUN: 37 mg/dL — AB (ref 6–20)
CO2: 28 mmol/L (ref 22–32)
Calcium: 8.8 mg/dL — ABNORMAL LOW (ref 8.9–10.3)
Chloride: 96 mmol/L — ABNORMAL LOW (ref 101–111)
Creatinine, Ser: 9.45 mg/dL — ABNORMAL HIGH (ref 0.61–1.24)
GFR calc Af Amer: 7 mL/min — ABNORMAL LOW (ref >60)
GFR calc non Af Amer: 6 mL/min — ABNORMAL LOW (ref >60)
GLUCOSE: 91 mg/dL (ref 65–99)
PHOSPHORUS: 7 mg/dL — AB (ref 2.5–4.6)
POTASSIUM: 4.1 mmol/L (ref 3.5–5.1)
Sodium: 134 mmol/L — ABNORMAL LOW (ref 135–145)

## 2016-03-08 LAB — CBC
HEMATOCRIT: 23.6 % — AB (ref 39.0–52.0)
Hemoglobin: 7.7 g/dL — ABNORMAL LOW (ref 13.0–17.0)
MCH: 29.3 pg (ref 26.0–34.0)
MCHC: 32.6 g/dL (ref 30.0–36.0)
MCV: 89.7 fL (ref 78.0–100.0)
Platelets: 353 10*3/uL (ref 150–400)
RBC: 2.63 MIL/uL — ABNORMAL LOW (ref 4.22–5.81)
RDW: 16.4 % — AB (ref 11.5–15.5)
WBC: 31.8 10*3/uL — ABNORMAL HIGH (ref 4.0–10.5)

## 2016-03-08 LAB — TYPE AND SCREEN
ABO/RH(D): A POS
ANTIBODY SCREEN: NEGATIVE
UNIT DIVISION: 0
UNIT DIVISION: 0
UNIT DIVISION: 0
Unit division: 0

## 2016-03-08 LAB — GLUCOSE, CAPILLARY
GLUCOSE-CAPILLARY: 117 mg/dL — AB (ref 65–99)
GLUCOSE-CAPILLARY: 101 mg/dL — AB (ref 65–99)
GLUCOSE-CAPILLARY: 110 mg/dL — AB (ref 65–99)
GLUCOSE-CAPILLARY: 97 mg/dL (ref 65–99)
Glucose-Capillary: 109 mg/dL — ABNORMAL HIGH (ref 65–99)

## 2016-03-08 LAB — PROTIME-INR
INR: 1.56
Prothrombin Time: 18.9 seconds — ABNORMAL HIGH (ref 11.4–15.2)

## 2016-03-08 LAB — HEPARIN LEVEL (UNFRACTIONATED)
Heparin Unfractionated: 0.92 IU/mL — ABNORMAL HIGH (ref 0.30–0.70)
Heparin Unfractionated: 0.34 IU/mL (ref 0.30–0.70)

## 2016-03-08 MED ORDER — PIPERACILLIN SOD-TAZOBACTAM SO 2.25 (2-0.25) G IV SOLR
2.2500 g | Freq: Four times a day (QID) | INTRAVENOUS | Status: DC
  Administered 2016-03-08 – 2016-03-10 (×8): 2.25 g via INTRAVENOUS
  Filled 2016-03-08 (×9): qty 2.25

## 2016-03-08 MED ORDER — VANCOMYCIN HCL 10 G IV SOLR
2000.0000 mg | Freq: Once | INTRAVENOUS | Status: AC
  Administered 2016-03-08: 2000 mg via INTRAVENOUS
  Filled 2016-03-08: qty 2000

## 2016-03-08 MED ORDER — VANCOMYCIN HCL IN DEXTROSE 1-5 GM/200ML-% IV SOLN
1000.0000 mg | INTRAVENOUS | Status: DC
  Administered 2016-03-10 – 2016-03-12 (×2): 1000 mg via INTRAVENOUS
  Filled 2016-03-08 (×2): qty 200

## 2016-03-08 MED ORDER — POLYETHYLENE GLYCOL 3350 17 G PO PACK: 34.0000 g | PACK | Freq: Two times a day (BID) | ORAL | Status: DC | PRN

## 2016-03-08 NOTE — Progress Notes (Signed)
Pharmacy Antibiotic Note  Jack Irwin is a 51 y.o. male admitted on 03/03/2016 with wound infection.  Pharmacy has been consulted for vancomycin and zosyn dosing.  Plan: Vancomycin 2 gm IV X 1 then 1 gm after each HD Zosyn 2.25 gm IV q6 hours F/u cultures and clinical course  Height: 5\' 11"  (180.3 cm) Weight: 251 lb 1.7 oz (113.9 kg) IBW/kg (Calculated) : 75.3  Temp (24hrs), Avg:98.8 F (37.1 C), Min:98.3 F (36.8 C), Max:99.1 F (37.3 C)   Recent Labs Lab 03/04/16 0604  03/04/16 1851 03/05/16 0458 03/06/16 0606 03/07/16 0544 03/08/16 0632  WBC 12.2*  < > 15.0* 14.2* 18.5* 21.3* 31.8*  CREATININE 17.23*  --   --  10.16* 9.08* 12.39* 9.45*  < > = values in this interval not displayed.  Estimated Creatinine Clearance: 12.3 mL/min (by C-G formula based on SCr of 9.45 mg/dL (H)).    No Known Allergies  Antimicrobials this admission: clinda 1/29 >> 2/3 vanc  2/3 >>  Zosyn  2/3>>  Microbiology results: 2/3 BCx: pending   Thank you for allowing pharmacy to be a part of this patient's care.  Excell Seltzer Poteet 03/08/2016 9:53 AM

## 2016-03-08 NOTE — Progress Notes (Signed)
PROGRESS NOTE  Jack Irwin KYH:[id number] DOB: 02-16-1965 DOA: 03/03/2016 PCP: Kingsley Callander, FNP  Brief Narrative: 51 year old male with multiple medical problems status post scrotal abscess incision and drainage with discharge January 26, with oral antibiotics and daily dressing changes/packing. Presented to the ER 1/29 with an episode of significant bleeding from the wound in the context of ongoing anticoagulation for DVT. Plans were made for observation, trending of hemoglobin, monitoring of INR and urology follow-up anticipated 1/30 in the morning.  Assessment/Plan 1. Bleeding from scrotal wound - improving now that INR is down.  Hg improved to 7.7 after 1 unit PRBC during HD, following,   INR now less than 2.  On heparin for anticoagulation. Follow INR.    Pt to go for I&D per urology when INR less than 2 likely 2/2.   2. Acute blood loss anemia secondary to above. Hemoglobin 7.7 after 1 unit PRBC 2/2.  Following.  3. Leukocytosis - I'm going to broaden antibiotic coverage today, urologist didn't feel that patient needed to go to back to OR. See notes.   4. Scrotal abscess status post incision and drainage prior to admission. Broaden antibiotic coverage zosyn and vanc IV.  5. Right lower extremity DVT December 2017. Holding warfarin for now.  Started heparin drip now that INR less than 2.   6. End-stage renal disease. Management per nephrology.  HD per nephrology team  Had HD 2/2.  7. Diabetes mellitus type 2 with diabetic neuropathy.  BS well controlled.  8. Severe Constipation - Pt had large BMs after enema yesterday, continue laxatives.  9. Tachycardia - sinus tach suspect secondary to infection, following.  HR 88 this morning. 10. IV infiltration RUE - continue warm compresses and elevation.     DVT prophylaxis: SCDs heparin Code Status: full code Family Communication: wife at bedside Disposition Plan: home   03/08/2016, 9:22 AM  LOS: 4 days    Consultants:  Nephrology  Urology  Procedures:   I&D ?  Antimicrobials:  Clindamycin (on prior to admission)  Zosyn/vanc 2/3  Interval history/Subjective: Pt reports concern with the groin induration and swelling but it has not been bleeding.  Objective: Vitals:   03/07/16 1525 03/07/16 1530 03/07/16 2300 03/08/16 0500  BP: (!) 104/58 (!) 112/55 130/79 (!) 92/57  Pulse: 100 (!) 102 (!) 127 88  Resp:  20 (!) 22 19  Temp:  98.8 F (37.1 C) 98.6 F (37 C) 99.1 F (37.3 C)  TempSrc:  Oral Oral Oral  SpO2:   97% 97%  Weight:      Height:        Intake/Output Summary (Last 24 hours) at 03/08/16 1517 Last data filed at 03/07/16 1800  Gross per 24 hour  Intake            906.2 ml  Output             1124 ml  Net           -217.8 ml     Filed Weights   03/05/16 0645 03/05/16 1000 03/07/16 1120  Weight: 114.6 kg (252 lb 10.4 oz) 114.6 kg (252 lb 10.4 oz) 113.9 kg (251 lb 1.7 oz)    Exam:    Constitutional: Appears calm, comfortable. Cardiovascular regular rate and rhythm. No murmur, rub or gallop. Respiratory. Clear to auscultation bilaterally. No wheezes, rales or rhonchi. Normal respiratory effort. Abdomen: soft, no masses palpated, normal BS.  Scrotum continues to be swollen with indurated area in pubic area  but no active bleeding seen. Ext: swollen RUE from IV infiltration improving from prior exam.  Results for orders placed or performed during the hospital encounter of 03/03/16  CBC  Result Value Ref Range   WBC 12.5 (H) 4.0 - 10.5 K/uL   RBC 3.32 (L) 4.22 - 5.81 MIL/uL   Hemoglobin 9.6 (L) 13.0 - 17.0 g/dL   HCT 29.2 (L) 39.0 - 52.0 %   MCV 88.0 78.0 - 100.0 fL   MCH 28.9 26.0 - 34.0 pg   MCHC 32.9 30.0 - 36.0 g/dL   RDW 16.2 (H) 11.5 - 15.5 %   Platelets 270 150 - 400 K/uL  Protime-INR  Result Value Ref Range   Prothrombin Time 35.7 (H) 11.4 - 15.2 seconds   INR 4.09   Basic metabolic panel  Result Value Ref Range   Sodium 133 (L) 135 -  145 mmol/L   Potassium 4.9 3.5 - 5.1 mmol/L   Chloride 93 (L) 101 - 111 mmol/L   CO2 22 22 - 32 mmol/L   Glucose, Bld 77 65 - 99 mg/dL   BUN 98 (H) 6 - 20 mg/dL   Creatinine, Ser 15.50 (H) 0.61 - 1.24 mg/dL   Calcium 9.0 8.9 - 10.3 mg/dL   GFR calc non Af Amer 3 (L) >60 mL/min   GFR calc Af Amer 4 (L) >60 mL/min   Anion gap 18 (H) 5 - 15  Glucose, capillary  Result Value Ref Range   Glucose-Capillary 68 65 - 99 mg/dL   Comment 1 Notify RN    Comment 2 Document in Chart   Basic metabolic panel  Result Value Ref Range   Sodium 134 (L) 135 - 145 mmol/L   Potassium 5.4 (H) 3.5 - 5.1 mmol/L   Chloride 94 (L) 101 - 111 mmol/L   CO2 22 22 - 32 mmol/L   Glucose, Bld 92 65 - 99 mg/dL   BUN 112 (H) 6 - 20 mg/dL   Creatinine, Ser 17.23 (H) 0.61 - 1.24 mg/dL   Calcium 8.8 (L) 8.9 - 10.3 mg/dL   GFR calc non Af Amer 3 (L) >60 mL/min   GFR calc Af Amer 3 (L) >60 mL/min   Anion gap 18 (H) 5 - 15  CBC  Result Value Ref Range   WBC 12.2 (H) 4.0 - 10.5 K/uL   RBC 2.67 (L) 4.22 - 5.81 MIL/uL   Hemoglobin 7.6 (L) 13.0 - 17.0 g/dL   HCT 23.5 (L) 39.0 - 52.0 %   MCV 88.0 78.0 - 100.0 fL   MCH 28.5 26.0 - 34.0 pg   MCHC 32.3 30.0 - 36.0 g/dL   RDW 15.5 11.5 - 15.5 %   Platelets 266 150 - 400 K/uL  Protime-INR  Result Value Ref Range   Prothrombin Time 37.4 (H) 11.4 - 15.2 seconds   INR 3.68   Glucose, capillary  Result Value Ref Range   Glucose-Capillary 114 (H) 65 - 99 mg/dL   Comment 1 Notify RN    Comment 2 Document in Chart   Glucose, capillary  Result Value Ref Range   Glucose-Capillary 97 65 - 99 mg/dL   Comment 1 Notify RN    Comment 2 Document in Chart   CBC  Result Value Ref Range   WBC 13.4 (H) 4.0 - 10.5 K/uL   RBC 2.63 (L) 4.22 - 5.81 MIL/uL   Hemoglobin 7.8 (L) 13.0 - 17.0 g/dL   HCT 22.8 (L) 39.0 - 52.0 %   MCV  86.7 78.0 - 100.0 fL   MCH 29.7 26.0 - 34.0 pg   MCHC 34.2 30.0 - 36.0 g/dL   RDW 16.4 (H) 11.5 - 15.5 %   Platelets 258 150 - 400 K/uL  Glucose,  capillary  Result Value Ref Range   Glucose-Capillary 89 65 - 99 mg/dL   Comment 1 Notify RN    Comment 2 Document in Chart   CBC  Result Value Ref Range   WBC 15.0 (H) 4.0 - 10.5 K/uL   RBC 2.65 (L) 4.22 - 5.81 MIL/uL   Hemoglobin 7.8 (L) 13.0 - 17.0 g/dL   HCT 23.0 (L) 39.0 - 52.0 %   MCV 86.8 78.0 - 100.0 fL   MCH 29.4 26.0 - 34.0 pg   MCHC 33.9 30.0 - 36.0 g/dL   RDW 16.3 (H) 11.5 - 15.5 %   Platelets 294 150 - 400 K/uL  Glucose, capillary  Result Value Ref Range   Glucose-Capillary 85 65 - 99 mg/dL   Comment 1 Notify RN    Comment 2 Document in Chart   Glucose, capillary  Result Value Ref Range   Glucose-Capillary 91 65 - 99 mg/dL   Comment 1 Notify RN    Comment 2 Document in Chart   CBC  Result Value Ref Range   WBC 14.2 (H) 4.0 - 10.5 K/uL   RBC 2.56 (L) 4.22 - 5.81 MIL/uL   Hemoglobin 7.6 (L) 13.0 - 17.0 g/dL   HCT 22.5 (L) 39.0 - 52.0 %   MCV 87.9 78.0 - 100.0 fL   MCH 29.7 26.0 - 34.0 pg   MCHC 33.8 30.0 - 36.0 g/dL   RDW 16.4 (H) 11.5 - 15.5 %   Platelets 275 150 - 400 K/uL  Renal function panel  Result Value Ref Range   Sodium 133 (L) 135 - 145 mmol/L   Potassium 4.7 3.5 - 5.1 mmol/L   Chloride 95 (L) 101 - 111 mmol/L   CO2 27 22 - 32 mmol/L   Glucose, Bld 132 (H) 65 - 99 mg/dL   BUN 50 (H) 6 - 20 mg/dL   Creatinine, Ser 10.16 (H) 0.61 - 1.24 mg/dL   Calcium 8.8 (L) 8.9 - 10.3 mg/dL   Phosphorus 6.7 (H) 2.5 - 4.6 mg/dL   Albumin 2.6 (L) 3.5 - 5.0 g/dL   GFR calc non Af Amer 5 (L) >60 mL/min   GFR calc Af Amer 6 (L) >60 mL/min   Anion gap 11 5 - 15  Protime-INR  Result Value Ref Range   Prothrombin Time 24.6 (H) 11.4 - 15.2 seconds   INR 2.18   Glucose, capillary  Result Value Ref Range   Glucose-Capillary 86 65 - 99 mg/dL   Comment 1 Notify RN    Comment 2 Document in Chart   Glucose, capillary  Result Value Ref Range   Glucose-Capillary 103 (H) 65 - 99 mg/dL  Glucose, capillary  Result Value Ref Range   Glucose-Capillary 107 (H) 65 - 99  mg/dL  CBC  Result Value Ref Range   WBC 18.5 (H) 4.0 - 10.5 K/uL   RBC 2.50 (L) 4.22 - 5.81 MIL/uL   Hemoglobin 7.3 (L) 13.0 - 17.0 g/dL   HCT 22.4 (L) 39.0 - 52.0 %   MCV 89.6 78.0 - 100.0 fL   MCH 29.2 26.0 - 34.0 pg   MCHC 32.6 30.0 - 36.0 g/dL   RDW 16.1 (H) 11.5 - 15.5 %   Platelets 312 150 - 400 K/uL  Renal function panel  Result Value Ref Range   Sodium 134 (L) 135 - 145 mmol/L   Potassium 4.5 3.5 - 5.1 mmol/L   Chloride 96 (L) 101 - 111 mmol/L   CO2 28 22 - 32 mmol/L   Glucose, Bld 91 65 - 99 mg/dL   BUN 42 (H) 6 - 20 mg/dL   Creatinine, Ser 9.08 (H) 0.61 - 1.24 mg/dL   Calcium 9.3 8.9 - 10.3 mg/dL   Phosphorus 7.3 (H) 2.5 - 4.6 mg/dL   Albumin 2.8 (L) 3.5 - 5.0 g/dL   GFR calc non Af Amer 6 (L) >60 mL/min   GFR calc Af Amer 7 (L) >60 mL/min   Anion gap 10 5 - 15  Protime-INR  Result Value Ref Range   Prothrombin Time 17.5 (H) 11.4 - 15.2 seconds   INR 1.42   Glucose, capillary  Result Value Ref Range   Glucose-Capillary 110 (H) 65 - 99 mg/dL  Glucose, capillary  Result Value Ref Range   Glucose-Capillary 90 65 - 99 mg/dL   Comment 1 Notify RN    Comment 2 Document in Chart   Glucose, capillary  Result Value Ref Range   Glucose-Capillary 85 65 - 99 mg/dL   Comment 1 Notify RN    Comment 2 Document in Chart   Glucose, capillary  Result Value Ref Range   Glucose-Capillary 90 65 - 99 mg/dL  Glucose, capillary  Result Value Ref Range   Glucose-Capillary 90 65 - 99 mg/dL  Heparin level (unfractionated)  Result Value Ref Range   Heparin Unfractionated <0.10 (L) 0.30 - 0.70 IU/mL  Glucose, capillary  Result Value Ref Range   Glucose-Capillary 106 (H) 65 - 99 mg/dL  CBC  Result Value Ref Range   WBC 21.3 (H) 4.0 - 10.5 K/uL   RBC 2.47 (L) 4.22 - 5.81 MIL/uL   Hemoglobin 7.2 (L) 13.0 - 17.0 g/dL   HCT 22.2 (L) 39.0 - 52.0 %   MCV 89.9 78.0 - 100.0 fL   MCH 29.1 26.0 - 34.0 pg   MCHC 32.4 30.0 - 36.0 g/dL   RDW 16.3 (H) 11.5 - 15.5 %   Platelets 363  150 - 400 K/uL  Renal function panel  Result Value Ref Range   Sodium 134 (L) 135 - 145 mmol/L   Potassium 4.6 3.5 - 5.1 mmol/L   Chloride 94 (L) 101 - 111 mmol/L   CO2 25 22 - 32 mmol/L   Glucose, Bld 96 65 - 99 mg/dL   BUN 64 (H) 6 - 20 mg/dL   Creatinine, Ser 12.39 (H) 0.61 - 1.24 mg/dL   Calcium 9.3 8.9 - 10.3 mg/dL   Phosphorus 9.4 (H) 2.5 - 4.6 mg/dL   Albumin 3.0 (L) 3.5 - 5.0 g/dL   GFR calc non Af Amer 4 (L) >60 mL/min   GFR calc Af Amer 5 (L) >60 mL/min   Anion gap 15 5 - 15  Protime-INR  Result Value Ref Range   Prothrombin Time 18.0 (H) 11.4 - 15.2 seconds   INR 1.47   Heparin level (unfractionated)  Result Value Ref Range   Heparin Unfractionated <0.10 (L) 0.30 - 0.70 IU/mL  Glucose, capillary  Result Value Ref Range   Glucose-Capillary 107 (H) 65 - 99 mg/dL   Comment 1 Notify RN    Comment 2 Document in Chart   Heparin level (unfractionated)  Result Value Ref Range   Heparin Unfractionated 0.85 (H) 0.30 - 0.70 IU/mL  Glucose, capillary  Result Value Ref Range   Glucose-Capillary 72 65 - 99 mg/dL  Glucose, capillary  Result Value Ref Range   Glucose-Capillary 119 (H) 65 - 99 mg/dL  CBC  Result Value Ref Range   WBC 31.8 (H) 4.0 - 10.5 K/uL   RBC 2.63 (L) 4.22 - 5.81 MIL/uL   Hemoglobin 7.7 (L) 13.0 - 17.0 g/dL   HCT 23.6 (L) 39.0 - 52.0 %   MCV 89.7 78.0 - 100.0 fL   MCH 29.3 26.0 - 34.0 pg   MCHC 32.6 30.0 - 36.0 g/dL   RDW 16.4 (H) 11.5 - 15.5 %   Platelets 353 150 - 400 K/uL  Renal function panel  Result Value Ref Range   Sodium 134 (L) 135 - 145 mmol/L   Potassium 4.1 3.5 - 5.1 mmol/L   Chloride 96 (L) 101 - 111 mmol/L   CO2 28 22 - 32 mmol/L   Glucose, Bld 91 65 - 99 mg/dL   BUN 37 (H) 6 - 20 mg/dL   Creatinine, Ser 9.45 (H) 0.61 - 1.24 mg/dL   Calcium 8.8 (L) 8.9 - 10.3 mg/dL   Phosphorus 7.0 (H) 2.5 - 4.6 mg/dL   Albumin 2.7 (L) 3.5 - 5.0 g/dL   GFR calc non Af Amer 6 (L) >60 mL/min   GFR calc Af Amer 7 (L) >60 mL/min   Anion gap 10 5  - 15  Protime-INR  Result Value Ref Range   Prothrombin Time 18.9 (H) 11.4 - 15.2 seconds   INR 1.56   Heparin level (unfractionated)  Result Value Ref Range   Heparin Unfractionated 0.92 (H) 0.30 - 0.70 IU/mL  Glucose, capillary  Result Value Ref Range   Glucose-Capillary 104 (H) 65 - 99 mg/dL  Glucose, capillary  Result Value Ref Range   Glucose-Capillary 117 (H) 65 - 99 mg/dL   Comment 1 Notify RN    Comment 2 Document in Chart   Glucose, capillary  Result Value Ref Range   Glucose-Capillary 101 (H) 65 - 99 mg/dL   Comment 1 Notify RN    Comment 2 Document in Chart   Type and screen [facility]  Result Value Ref Range   ABO/RH(D) A POS    Antibody Screen NEG    Sample Expiration 03/07/2016    Unit Number [id number]    Blood Component Type RED CELLS,LR    Unit division 00    Status of Unit ISSUED,FINAL    Transfusion Status OK TO TRANSFUSE    Crossmatch Result Compatible    Unit Number [id number]    Blood Component Type RED CELLS,LR    Unit division 00    Status of Unit REL FROM [facility]    Transfusion Status OK TO TRANSFUSE    Crossmatch Result Compatible    Unit Number [id number]    Blood Component Type RED CELLS,LR    Unit division 00    Status of Unit REL FROM [facility]    Transfusion Status OK TO TRANSFUSE    Crossmatch Result Compatible    Unit Number [id number]    Blood Component Type RED CELLS,LR    Unit division 00    Status of Unit REL FROM [facility]    Transfusion Status OK TO TRANSFUSE    Crossmatch Result Compatible   Prepare RBC  Result Value Ref Range   Order Confirmation ORDER PROCESSED BY BLOOD BANK    Scheduled Meds: . sodium chloride   Intravenous Once  . clindamycin  300 mg Oral Q8H  .  epoetin (EPOGEN/PROCRIT) injection  10,000 Units Intravenous Q T,Th,Sa-HD  . febuxostat  80 mg Oral Daily  . gabapentin  100 mg Oral Daily  . insulin aspart  0-15 Units Subcutaneous TID WC  . polyethylene glycol  34 g Oral BID  .  senna-docusate  2 tablet Oral BID  . sevelamer carbonate  3,200 mg Oral TID WC   Continuous Infusions: . heparin 1,900 Units/hr (03/08/16 7371)   Principal Problem:   Bleeding from wound Active Problems:   Weakness of both legs   Arthritis, gouty   Hypertension   ESRD on dialysis ([place])   RBBB   Scrotal abscess   Type 2 diabetes mellitus (HCC)   Chronic anticoagulation   Constipation   Anemia in chronic kidney disease (CKD)   LOS: 4 days   Jaritza Duignan 03/08/2016 9:22 AM

## 2016-03-08 NOTE — Progress Notes (Signed)
Assessment and Plan: 1.  Scrotal abscess with worsening leukocytosis but no fever.   There is persistent swelling but there is less tenderness and no fluctuant areas.   I don't see anything obvious to drain and probed the wound deeply yesterday without return of pus.   If he doesn't respond with the broadened coverage, then a pelvic CT including the genitalia may be worthwhile to assess for a persistent deep abscess.   Subjective: Jack Irwin has no significant complaints today other than mild discomfort related the the scrotal infection.   He has been afebrile for the last 24 hours but his leukocytosis has worsened.   The dressing was changed earlier today.   He has no associated signs or symptoms.  Vancomycin has been added to his coverage. His wound culture grew predominantly Gram positive organisms and was beta lactamase positive.  ROS:  Review of Systems  Constitutional: Negative for chills and fever.  Gastrointestinal: Negative for abdominal pain.    Anti-infectives: Anti-infectives    Start     Dose/Rate Route Frequency Ordered Stop   03/10/16 1200  vancomycin (VANCOCIN) IVPB 1000 mg/200 mL premix     1,000 mg 200 mL/hr over 60 Minutes Intravenous Every M-W-F (Hemodialysis) 03/08/16 0957     03/08/16 1200  piperacillin-tazobactam (ZOSYN) 2.25 g in dextrose 5 % 50 mL IVPB     2.25 g 100 mL/hr over 30 Minutes Intravenous Every 6 hours 03/08/16 0944     03/08/16 0945  vancomycin (VANCOCIN) 2,000 mg in sodium chloride 0.9 % 500 mL IVPB     2,000 mg 250 mL/hr over 120 Minutes Intravenous  Once 03/08/16 0944 03/08/16 1344   03/03/16 2215  clindamycin (CLEOCIN) capsule 300 mg  Status:  Discontinued     300 mg Oral Every 8 hours 03/03/16 2204 03/08/16 0925      Current Facility-Administered Medications  Medication Dose Route Frequency Provider Last Rate Last Dose  . 0.9 %  sodium chloride infusion  100 mL Intravenous PRN Fran Lowes, MD      . 0.9 %  sodium chloride infusion  100  mL Intravenous PRN Fran Lowes, MD      . 0.9 %  sodium chloride infusion   Intravenous Once Samuella Cota, MD      . 0.9 %  sodium chloride infusion  100 mL Intravenous PRN Fran Lowes, MD      . 0.9 %  sodium chloride infusion  100 mL Intravenous PRN Fran Lowes, MD      . acetaminophen (TYLENOL) tablet 650 mg  650 mg Oral Q6H PRN Karmen Bongo, MD       Or  . acetaminophen (TYLENOL) suppository 650 mg  650 mg Rectal Q6H PRN Karmen Bongo, MD      . alteplase (CATHFLO ACTIVASE) injection 2 mg  2 mg Intracatheter Once PRN Fran Lowes, MD      . alteplase (CATHFLO ACTIVASE) injection 2 mg  2 mg Intracatheter Once PRN Fran Lowes, MD      . camphor-menthol Timoteo Ace) lotion 1 application  1 application Topical Z6X PRN Karmen Bongo, MD       And  . hydrOXYzine (ATARAX/VISTARIL) tablet 25 mg  25 mg Oral Q8H PRN Karmen Bongo, MD      . epoetin alfa (EPOGEN,PROCRIT) injection 10,000 Units  10,000 Units Intravenous Q T,Th,Sa-HD Fran Lowes, MD   10,000 Units at 03/07/16 1203  . febuxostat (ULORIC) tablet 80 mg  80 mg Oral Daily Samuella Cota, MD  80 mg at 03/08/16 0848  . feeding supplement (NEPRO CARB STEADY) liquid 237 mL  237 mL Oral TID PRN Karmen Bongo, MD      . gabapentin (NEURONTIN) capsule 100 mg  100 mg Oral Daily Karmen Bongo, MD   100 mg at 03/08/16 0849  . guaiFENesin-dextromethorphan (ROBITUSSIN DM) 100-10 MG/5ML syrup 5 mL  5 mL Oral Q4H PRN Samuella Cota, MD   5 mL at 03/04/16 1248  . heparin ADULT infusion 100 units/mL (25000 units/228mL sodium chloride 0.45%)  1,900 Units/hr Intravenous Continuous Clanford Marisa Hua, MD 19 mL/hr at 03/08/16 0838 1,900 Units/hr at 03/08/16 0838  . heparin injection 1,000 Units  1,000 Units Dialysis PRN Fran Lowes, MD   1,000 Units at 03/05/16 1229  . heparin injection 1,000 Units  1,000 Units Dialysis PRN Fran Lowes, MD   4,800 Units at 03/07/16 1400  . insulin aspart (novoLOG)  injection 0-15 Units  0-15 Units Subcutaneous TID WC Karmen Bongo, MD      . lidocaine (PF) (XYLOCAINE) 1 % injection 5 mL  5 mL Intradermal PRN Fran Lowes, MD      . lidocaine-prilocaine (EMLA) cream 1 application  1 application Topical PRN Fran Lowes, MD      . ondansetron (ZOFRAN) tablet 4 mg  4 mg Oral Q6H PRN Karmen Bongo, MD       Or  . ondansetron [facility]) injection 4 mg  4 mg Intravenous Q6H PRN Karmen Bongo, MD   4 mg at 03/06/16 0539  . pentafluoroprop-tetrafluoroeth (GEBAUERS) aerosol 1 application  1 application Topical PRN Fran Lowes, MD      . piperacillin-tazobactam (ZOSYN) 2.25 g in dextrose 5 % 50 mL IVPB  2.25 g Intravenous Q6H Clanford L Johnson, MD   2.25 g at 03/08/16 1425  . polyethylene glycol (MIRALAX / GLYCOLAX) packet 34 g  34 g Oral BID PRN Clanford Marisa Hua, MD      . senna-docusate (Senokot-S) tablet 2 tablet  2 tablet Oral BID Clanford Marisa Hua, MD   2 tablet at 03/08/16 0849  . sevelamer carbonate (RENVELA) tablet 3,200 mg  3,200 mg Oral TID WC Karmen Bongo, MD   3,200 mg at 03/08/16 1153  . sevelamer carbonate (RENVELA) tablet 800 mg  800 mg Oral BID BM PRN Karmen Bongo, MD      . sorbitol 70 % solution 30 mL  30 mL Oral PRN Karmen Bongo, MD      . sorbitol, milk of mag, mineral oil, glycerin (SMOG) enema  960 mL Rectal Once PRN Clanford Marisa Hua, MD      . traMADol Veatrice Bourbon) tablet 50 mg  50 mg Oral Daily PRN Karmen Bongo, MD   50 mg at 03/07/16 1700  . [START ON 03/10/2016] vancomycin (VANCOCIN) IVPB 1000 mg/200 mL premix  1,000 mg Intravenous Q M,W,F-HD Clanford Marisa Hua, MD         Objective: Vital signs in last 24 hours: Temp:  [97.9 F (36.6 C)-99.1 F (37.3 C)] 97.9 F (36.6 C) (02/03 1453) Pulse Rate:  [88-127] 99 (02/03 1453) Resp:  [19-22] 20 (02/03 1453) BP: (92-130)/(55-79) 108/58 (02/03 1453) SpO2:  [97 %-98 %] 98 % (02/03 1453)  Intake/Output from previous day: 02/02 0701 - 02/03 0700 In: 906.2  [I.V.:571.2; Blood:335] Out: 1124  Intake/Output this shift: Total I/O In: 480 [P.O.:480] Out: -    Physical Exam  Constitutional: He is oriented to person, place, and time and well-developed, well-nourished, and in no distress.  Cardiovascular:  He has had variable tachycardia.  Pulmonary/Chest: Effort normal.  Abdominal: Soft. There is no tenderness.  Genitourinary:  Genitourinary Comments: He has persistent swelling of the right scrotum with swelling and induration in the right inguinal area but no erythema or streaking beyond the area of swelling.  The left scrotum and testicle are unremarkable, but the right testicle is not discretely palpable.  Penis is normal.   I examined the anus and perineum since he has had prior abscess in this area but the anus and perineum are normal.  Musculoskeletal: Normal range of motion.  Neurological: He is alert and oriented to person, place, and time.  Skin: Skin is warm and dry.  Psychiatric: Mood and affect normal.  Vitals reviewed.   Lab Results:   Recent Labs  03/07/16 0544 03/08/16 0632  WBC 21.3* 31.8*  HGB 7.2* 7.7*  HCT 22.2* 23.6*  PLT 363 353   BMET  Recent Labs  03/07/16 0544 03/08/16 0632  NA 134* 134*  K 4.6 4.1  CL 94* 96*  CO2 25 28  GLUCOSE 96 91  BUN 64* 37*  CREATININE 12.39* 9.45*  CALCIUM 9.3 8.8*   PT/INR  Recent Labs  03/07/16 0544 03/08/16 0632  LABPROT 18.0* 18.9*  INR 1.47 1.56   ABG No results for input(s): PHART, HCO3 in the last 72 hours.  Invalid input(s): PCO2, PO2  Studies/Results: No results found.  Labs reviewed.   Notes from Dr. Hulan Saas reviewed.        LOS: 4 days    Malka So 03/08/2016 [phone] ID: [name], male   DOB: 04-17-1965, 51 y.o.   MRN: [id number]

## 2016-03-08 NOTE — Progress Notes (Addendum)
ANTICOAGULATION CONSULT NOTE   Pharmacy Consult for Heparin Indication: VTE treatment  No Known Allergies  Patient Measurements: Height: 5\' 11"  (180.3 cm) Weight: 251 lb 1.7 oz (113.9 kg) IBW/kg (Calculated) : 75.3 HEPARIN DW (KG): 101.6  Vital Signs: Temp: 99.1 F (37.3 C) (02/03 0500) Temp Source: Oral (02/03 0500) BP: 92/57 (02/03 0500) Pulse Rate: 88 (02/03 0500)  Labs:  Recent Labs  03/06/16 0606  03/07/16 0544 03/07/16 1635 03/08/16 0632  HGB 7.3*  --  7.2*  --  7.7*  HCT 22.4*  --  22.2*  --  23.6*  PLT 312  --  363  --  353  LABPROT 17.5*  --  18.0*  --  18.9*  INR 1.42  --  1.47  --  1.56  HEPARINUNFRC  --   < > <0.10* 0.85* 0.92*  CREATININE 9.08*  --  12.39*  --   --   < > = values in this interval not displayed.  Estimated Creatinine Clearance: 9.4 mL/min (by C-G formula based on SCr of 12.39 mg/dL (H)).   Medical History: Past Medical History:  Diagnosis Date  . Anemia   . Arthritis   . BPH (benign prostatic hyperplasia)   . Chronic back pain   . Difficulty walking   . DVT (deep venous thrombosis) ([place])    Right popliteal DVT December 2017  . End-stage renal disease on hemodialysis ([place])    Dr. Lowanda Foster  . Erectile dysfunction   . Essential hypertension   . Gout   . Neuropathy, diabetic ([place])   . Type 2 diabetes mellitus ([place])   . Venous (peripheral) insufficiency     Medications:  Prescriptions Prior to Admission  Medication Sig Dispense Refill Last Dose  . clindamycin (CLEOCIN) 300 MG capsule Take 1 capsule (300 mg total) by mouth every 8 (eight) hours. 30 capsule 0 03/03/2016 at Unknown time  . colchicine 0.6 MG tablet 1/2 tab po 2 times /week (Patient taking differently: Take 0.3 mg by mouth daily as needed (for gout). ) 15 tablet 0 unknown  . gabapentin (NEURONTIN) 100 MG capsule Take 100 mg by mouth daily.    03/04/2016 at Unknown time  . Multiple Vitamin (DAILY VITE PO) Take 1 tablet by mouth daily.   unknown  .  sevelamer carbonate (RENVELA) 800 MG tablet Take 4 tablets (3,200 mg total) by mouth 3 (three) times daily with meals. 90 tablet 0 03/03/2016 at Unknown time  . sevelamer carbonate (RENVELA) 800 MG tablet Take 1 tablet (800 mg total) by mouth 2 (two) times daily between meals as needed (With snacks). 60 tablet 0 03/03/2016 at Unknown time  . sildenafil (REVATIO) 20 MG tablet Take 20 mg by mouth daily as needed. Dissolved under tongue (TROCHE) 60 minutes prior to sexual encounter   unknown  . traMADol (ULTRAM) 50 MG tablet Take 1 tablet (50 mg total) by mouth daily as needed (with dressing changes). 30 tablet 0 03/03/2016 at Unknown time  . ULORIC 80 MG TABS Take 80 mg by mouth daily.   03/03/2016 at Unknown time  . warfarin (COUMADIN) 5 MG tablet Take 2.5 mg by mouth daily. Take 1/2 tablet daily   Past Week at 1900    Assessment: 51 year old male with multiple medical problems including ESRD on HD,  recently discharge January 26 for scrotal abscess that was I/D. Presented to the ER 1/29 with an episode of significant bleeding from the wound in the context of ongoing anticoagulation for DVT.There is no ongoing bleeding.  He was transfused and given Vit. K 1mg  on 1/30. Asked to restart anticoagulation with heparin.  Heparin level now supratherapeutic Goal of Therapy:  Anti-Xa level 0.3-7  Monitor platelets by anticoagulation protocol: Yes   Plan:  Decrease heparin infusion to 1900 units/hr Check anti-Xa level in 8 hours and daily while on heparin Continue to monitor H&H and platelets  Thanks for allowing pharmacy to be a part of this patient's care.  Excell Seltzer, PharmD Clinical Pharmacist 03/08/2016,8:40 AM   Addum:  Heparin level therapeutic.  Cont drip at 1900 units/hr.  F/u am labs Excell Seltzer, PharmD

## 2016-03-08 NOTE — Progress Notes (Signed)
Subjective: Interval History: He is feeling slightly better. He denies any difficulty breathing. Potassium weakness  Objective: Vital signs in last 24 hours: Temp:  [98.3 F (36.8 C)-99.1 F (37.3 C)] 99.1 F (37.3 C) (02/03 0500) Pulse Rate:  [88-127] 88 (02/03 0500) Resp:  [16-22] 19 (02/03 0500) BP: (88-130)/(33-79) 92/57 (02/03 0500) SpO2:  [97 %-100 %] 97 % (02/03 0500) Weight:  [113.9 kg (251 lb 1.7 oz)] 113.9 kg (251 lb 1.7 oz) (02/02 1120) Weight change:   Intake/Output from previous day: 02/02 0701 - 02/03 0700 In: 906.2 [I.V.:571.2; Blood:335] Out: 1124  Intake/Output this shift: No intake/output data recorded.  General appearance: alert, cooperative and no distress Resp: clear to auscultation bilaterally Cardio: regular rate and rhythm Male genitalia: Patient with scrotal swelling and edema. Extremities: venous stasis dermatitis noted  Lab Results:  Recent Labs  03/07/16 0544 03/08/16 0632  WBC 21.3* 31.8*  HGB 7.2* 7.7*  HCT 22.2* 23.6*  PLT 363 353   BMET:   Recent Labs  03/07/16 0544 03/08/16 0632  NA 134* 134*  K 4.6 4.1  CL 94* 96*  CO2 25 28  GLUCOSE 96 91  BUN 64* 37*  CREATININE 12.39* 9.45*  CALCIUM 9.3 8.8*   No results for input(s): PTH in the last 72 hours. Iron Studies: No results for input(s): IRON, TIBC, TRANSFERRIN, FERRITIN in the last 72 hours.  Studies/Results: Dg Abd Portable 1v  Result Date: 03/06/2016 CLINICAL DATA:  Constipation . EXAM: PORTABLE ABDOMEN - 1 VIEW COMPARISON:  CT 02/25/2016.  CT 11/01/2015. FINDINGS: Soft tissue structures are unremarkable. Prominent amount of stool noted throughout the colon suggesting constipation. No prominent bowel distention. No free air. Degenerative changes thoracolumbar spine IMPRESSION: 1. Prominent amount of stool noted throughout the colon consistent with constipation. 2.  Degenerative changes thoracolumbar spine. Electronically Signed   By: Marcello Moores  Register   On: 03/06/2016 09:18     I have reviewed the patient's current medications.  Assessment/Plan: Problem #1 scrotal abscess: Status D&C. Patient is on antibiotics. His white blood cell count seems to be getting worse. Patient presently is afebrile. Problem #2 end-stage renal disease: Status post hemodialysis yesterday. Presently is asymptomatic with normal potassium. Problem #3 anemia: Possibly a combination of bleeding and anemia of chronic disease. His hemoglobin is improving Problem #4 metabolic bone disease: His calcium is range but phosphorus is high. Patient is started on binder. His phosphorus is improving. Problem #5 fluid management: Presently he doesn't have any sign of fluid overload. Where able to remove about a liter yesterday because of recurrent hypotension. Problem #6 history of cardiomyopathy Problem #7 history of obesity Plan: 1] patient doesn't require dialysis today 2] we'll check his renal panel and CBC in the morning. 3] his next dialysis will be on Monday   LOS: 4 days   Jack Irwin S 03/08/2016,9:12 AM

## 2016-03-09 DIAGNOSIS — I4510 Unspecified right bundle-branch block: Secondary | ICD-10-CM

## 2016-03-09 LAB — HEPARIN LEVEL (UNFRACTIONATED)
Heparin Unfractionated: 0.24 IU/mL — ABNORMAL LOW (ref 0.30–0.70)
Heparin Unfractionated: 0.44 IU/mL (ref 0.30–0.70)

## 2016-03-09 LAB — CBC WITH DIFFERENTIAL/PLATELET
BASOS PCT: 0 %
Basophils Absolute: 0 10*3/uL (ref 0.0–0.1)
EOS ABS: 0.2 10*3/uL (ref 0.0–0.7)
Eosinophils Relative: 1 %
HCT: 20.6 % — ABNORMAL LOW (ref 39.0–52.0)
HEMOGLOBIN: 6.8 g/dL — AB (ref 13.0–17.0)
Lymphocytes Relative: 9 %
Lymphs Abs: 2.2 10*3/uL (ref 0.7–4.0)
MCH: 29.6 pg (ref 26.0–34.0)
MCHC: 33 g/dL (ref 30.0–36.0)
MCV: 89.6 fL (ref 78.0–100.0)
Monocytes Absolute: 1.5 10*3/uL — ABNORMAL HIGH (ref 0.1–1.0)
Monocytes Relative: 6 %
NEUTROS PCT: 84 %
Neutro Abs: 20.7 10*3/uL — ABNORMAL HIGH (ref 1.7–7.7)
Platelets: 284 10*3/uL (ref 150–400)
RBC: 2.3 MIL/uL — AB (ref 4.22–5.81)
RDW: 16.5 % — ABNORMAL HIGH (ref 11.5–15.5)
WBC: 24.6 10*3/uL — AB (ref 4.0–10.5)

## 2016-03-09 LAB — PROTIME-INR
INR: 1.46
PROTHROMBIN TIME: 17.8 s — AB (ref 11.4–15.2)

## 2016-03-09 LAB — GLUCOSE, CAPILLARY
GLUCOSE-CAPILLARY: 96 mg/dL (ref 65–99)
GLUCOSE-CAPILLARY: 86 mg/dL (ref 65–99)
GLUCOSE-CAPILLARY: 95 mg/dL (ref 65–99)
GLUCOSE-CAPILLARY: 71 mg/dL (ref 65–99)
GLUCOSE-CAPILLARY: 108 mg/dL — AB (ref 65–99)
Glucose-Capillary: 99 mg/dL (ref 65–99)

## 2016-03-09 LAB — PREPARE RBC (CROSSMATCH)

## 2016-03-09 MED ORDER — SODIUM CHLORIDE 0.9 % IV SOLN: Freq: Once | INTRAVENOUS | Status: DC

## 2016-03-09 MED ORDER — PIPERACILLIN-TAZOBACTAM IN DEX 2-0.25 GM/50ML IV SOLN
INTRAVENOUS | Status: AC
Start: 2016-03-09 — End: ?
  Filled 2016-03-09: qty 50

## 2016-03-09 MED ORDER — PIPERACILLIN-TAZOBACTAM IN DEX 2-0.25 GM/50ML IV SOLN
INTRAVENOUS | Status: AC
Start: 2016-03-09 — End: 2016-03-09
  Filled 2016-03-09: qty 50

## 2016-03-09 NOTE — Progress Notes (Signed)
Subjective: Interval History: Patient offers no complaints. He denies any nausea or vomiting. Denies also any difficulty breathing.  Objective: Vital signs in last 24 hours: Temp:  [97.9 F (36.6 C)-99 F (37.2 C)] 99 F (37.2 C) (02/04 0451) Pulse Rate:  [99-103] 102 (02/04 0451) Resp:  [19-20] 19 (02/04 0451) BP: (82-108)/(45-58) 103/56 (02/04 0451) SpO2:  [95 %-98 %] 95 % (02/04 0451) Weight change:   Intake/Output from previous day: 02/03 0701 - 02/04 0700 In: 1217.6 [P.O.:720; I.V.:447.6; IV Piggyback:50] Out: -  Intake/Output this shift: Total I/O In: -  Out: 250 [Urine:250]  General appearance: alert, cooperative and no distress Resp: clear to auscultation bilaterally Cardio: regular rate and rhythm Male genitalia: Patient with scrotal swelling and edema. Extremities: venous stasis dermatitis noted  Lab Results:  Recent Labs  03/08/16 0632 03/09/16 0701  WBC 31.8* 24.6*  HGB 7.7* 6.8*  HCT 23.6* 20.6*  PLT 353 284   BMET:   Recent Labs  03/07/16 0544 03/08/16 0632  NA 134* 134*  K 4.6 4.1  CL 94* 96*  CO2 25 28  GLUCOSE 96 91  BUN 64* 37*  CREATININE 12.39* 9.45*  CALCIUM 9.3 8.8*   No results for input(s): PTH in the last 72 hours. Iron Studies: No results for input(s): IRON, TIBC, TRANSFERRIN, FERRITIN in the last 72 hours.  Studies/Results: No results found.  I have reviewed the patient's current medications.  Assessment/Plan: Problem #1 scrotal abscess: Status D&C. Patient is on antibiotics. His white blood cell count Is still high but improving. Patient presently is afebrile. Problem #2 end-stage renal disease: Status post hemodialysis on Friday. Presently patient doesn't have any uremic signs and symptoms. Problem #3 anemia: Possibly a combination of bleeding and anemia of chronic disease. His hemoglobin is declining. Problem #4 metabolic bone disease: His calcium is range but phosphorus is high but improving. Presently patient is on a  binder. Problem #5 fluid management: Patient denies any difficulty breathing. Problem #6 history of cardiomyopathy Problem #7 history of obesity Plan: 1] will dialyze patient tomorrow for 4 hours. 2] we'll check his renal panel and CBC in the morning. 3] we'll continue with Epogen   LOS: 5 days   Despina Boan S 03/09/2016,9:51 AM

## 2016-03-09 NOTE — Progress Notes (Signed)
PROGRESS NOTE  Jack Irwin EQA:[id number] DOB: 1965-04-03 DOA: 03/03/2016 PCP: Kingsley Callander, FNP  Brief Narrative: 51 year old male with multiple medical problems status post scrotal abscess incision and drainage with discharge January 26, with oral antibiotics and daily dressing changes/packing. Presented to the ER 1/29 with an episode of significant bleeding from the wound in the context of ongoing anticoagulation for DVT. Plans were made for observation, trending of hemoglobin, monitoring of INR and urology follow-up anticipated 1/30 in the morning.  Assessment/Plan 1. Bleeding from scrotal wound - improving.  Hg continuing to trend down. Hemoglobin now 6.8.  INR now less than 2.  On heparin for anticoagulation. Follow INR.    Pt to go for I&D per urology when INR less than 2 likely 2/2.   2. Anemia of chronic disease renal failure. Plan to transfuse 1 unit packed red blood cells during hemodialysis 2/5.  Following CBC.  3. Leukocytosis - WBC slightly improved with broader spectrum antibiotic coverage will continue.   4. Scrotal abscess status post incision and drainage prior to admission. Broaden antibiotic coverage zosyn and vanc IV.  5. Right lower extremity DVT December 2017. Holding warfarin for now.  Started heparin drip now that INR less than 2.   6. End-stage renal disease. Management per nephrology.  HD per nephrology team  Had HD 2/2.  Plan for HD 2/5 with 1 unit PRBC. 7. Diabetes mellitus type 2 with diabetic neuropathy.  BS well controlled.  8. Severe Constipation - Pt had large BMs, continue laxatives.  9. Tachycardia - sinus tach suspect secondary to infection, following.  10. IV infiltration RUE - continue warm compresses and elevation.     DVT prophylaxis: SCDs heparin Code Status: full code Family Communication: wife Disposition Plan: home   03/09/2016, 7:51 AM  LOS: 5 days   Consultants:  Nephrology  Urology  Procedures:   I&D  ?  Antimicrobials:  Clindamycin (on prior to admission)  Zosyn/vanc 2/3  Interval history/Subjective: Pt without complaints today  Objective: Vitals:   03/08/16 1453 03/08/16 1939 03/08/16 1940 03/09/16 0451  BP: (!) 108/58 (!) 82/45 (!) 108/55 (!) 103/56  Pulse: 99 (!) 103  (!) 102  Resp: 20 20  19   Temp: 97.9 F (36.6 C) 98.6 F (37 C)  99 F (37.2 C)  TempSrc: Oral Oral  Oral  SpO2: 98% 98%  95%  Weight:      Height:        Intake/Output Summary (Last 24 hours) at 03/09/16 0751 Last data filed at 03/08/16 1800  Gross per 24 hour  Intake          1217.62 ml  Output                0 ml  Net          1217.62 ml     Filed Weights   03/05/16 0645 03/05/16 1000 03/07/16 1120  Weight: 114.6 kg (252 lb 10.4 oz) 114.6 kg (252 lb 10.4 oz) 113.9 kg (251 lb 1.7 oz)    Exam:    Constitutional: Appears calm, comfortable. Cardiovascular regular rate and rhythm. No murmur, rub or gallop. Respiratory. Clear to auscultation bilaterally. No wheezes, rales or rhonchi. Normal respiratory effort. Abdomen: soft, no masses palpated, normal BS.  Scrotum continues to be swollen with indurated area in pubic area but no active bleeding seen. Ext: swollen RUE from IV infiltration improving from prior exam.  Results for orders placed or performed during the hospital encounter of  03/03/16  Culture, blood (Routine X 2) w Reflex to ID Panel  Result Value Ref Range   Specimen Description BLOOD BLOOD RIGHT HAND    Special Requests BOTTLES DRAWN AEROBIC AND ANAEROBIC 5CC    Culture NO GROWTH < 12 HOURS    Report Status PENDING   Culture, blood (Routine X 2) w Reflex to ID Panel  Result Value Ref Range   Specimen Description BLOOD BLOOD RIGHT HAND    Special Requests BOTTLES DRAWN AEROBIC AND ANAEROBIC 6CC    Culture PENDING    Report Status PENDING   CBC  Result Value Ref Range   WBC 12.5 (H) 4.0 - 10.5 K/uL   RBC 3.32 (L) 4.22 - 5.81 MIL/uL   Hemoglobin 9.6 (L) 13.0 - 17.0 g/dL    HCT 29.2 (L) 39.0 - 52.0 %   MCV 88.0 78.0 - 100.0 fL   MCH 28.9 26.0 - 34.0 pg   MCHC 32.9 30.0 - 36.0 g/dL   RDW 16.2 (H) 11.5 - 15.5 %   Platelets 270 150 - 400 K/uL  Protime-INR  Result Value Ref Range   Prothrombin Time 35.7 (H) 11.4 - 15.2 seconds   INR 8.45   Basic metabolic panel  Result Value Ref Range   Sodium 133 (L) 135 - 145 mmol/L   Potassium 4.9 3.5 - 5.1 mmol/L   Chloride 93 (L) 101 - 111 mmol/L   CO2 22 22 - 32 mmol/L   Glucose, Bld 77 65 - 99 mg/dL   BUN 98 (H) 6 - 20 mg/dL   Creatinine, Ser 15.50 (H) 0.61 - 1.24 mg/dL   Calcium 9.0 8.9 - 10.3 mg/dL   GFR calc non Af Amer 3 (L) >60 mL/min   GFR calc Af Amer 4 (L) >60 mL/min   Anion gap 18 (H) 5 - 15  Glucose, capillary  Result Value Ref Range   Glucose-Capillary 68 65 - 99 mg/dL   Comment 1 Notify RN    Comment 2 Document in Chart   Basic metabolic panel  Result Value Ref Range   Sodium 134 (L) 135 - 145 mmol/L   Potassium 5.4 (H) 3.5 - 5.1 mmol/L   Chloride 94 (L) 101 - 111 mmol/L   CO2 22 22 - 32 mmol/L   Glucose, Bld 92 65 - 99 mg/dL   BUN 112 (H) 6 - 20 mg/dL   Creatinine, Ser 17.23 (H) 0.61 - 1.24 mg/dL   Calcium 8.8 (L) 8.9 - 10.3 mg/dL   GFR calc non Af Amer 3 (L) >60 mL/min   GFR calc Af Amer 3 (L) >60 mL/min   Anion gap 18 (H) 5 - 15  CBC  Result Value Ref Range   WBC 12.2 (H) 4.0 - 10.5 K/uL   RBC 2.67 (L) 4.22 - 5.81 MIL/uL   Hemoglobin 7.6 (L) 13.0 - 17.0 g/dL   HCT 23.5 (L) 39.0 - 52.0 %   MCV 88.0 78.0 - 100.0 fL   MCH 28.5 26.0 - 34.0 pg   MCHC 32.3 30.0 - 36.0 g/dL   RDW 15.5 11.5 - 15.5 %   Platelets 266 150 - 400 K/uL  Protime-INR  Result Value Ref Range   Prothrombin Time 37.4 (H) 11.4 - 15.2 seconds   INR 3.68   Glucose, capillary  Result Value Ref Range   Glucose-Capillary 114 (H) 65 - 99 mg/dL   Comment 1 Notify RN    Comment 2 Document in Chart   Glucose, capillary  Result Value  Ref Range   Glucose-Capillary 97 65 - 99 mg/dL   Comment 1 Notify RN    Comment 2  Document in Chart   CBC  Result Value Ref Range   WBC 13.4 (H) 4.0 - 10.5 K/uL   RBC 2.63 (L) 4.22 - 5.81 MIL/uL   Hemoglobin 7.8 (L) 13.0 - 17.0 g/dL   HCT 22.8 (L) 39.0 - 52.0 %   MCV 86.7 78.0 - 100.0 fL   MCH 29.7 26.0 - 34.0 pg   MCHC 34.2 30.0 - 36.0 g/dL   RDW 16.4 (H) 11.5 - 15.5 %   Platelets 258 150 - 400 K/uL  Glucose, capillary  Result Value Ref Range   Glucose-Capillary 89 65 - 99 mg/dL   Comment 1 Notify RN    Comment 2 Document in Chart   CBC  Result Value Ref Range   WBC 15.0 (H) 4.0 - 10.5 K/uL   RBC 2.65 (L) 4.22 - 5.81 MIL/uL   Hemoglobin 7.8 (L) 13.0 - 17.0 g/dL   HCT 23.0 (L) 39.0 - 52.0 %   MCV 86.8 78.0 - 100.0 fL   MCH 29.4 26.0 - 34.0 pg   MCHC 33.9 30.0 - 36.0 g/dL   RDW 16.3 (H) 11.5 - 15.5 %   Platelets 294 150 - 400 K/uL  Glucose, capillary  Result Value Ref Range   Glucose-Capillary 85 65 - 99 mg/dL   Comment 1 Notify RN    Comment 2 Document in Chart   Glucose, capillary  Result Value Ref Range   Glucose-Capillary 91 65 - 99 mg/dL   Comment 1 Notify RN    Comment 2 Document in Chart   CBC  Result Value Ref Range   WBC 14.2 (H) 4.0 - 10.5 K/uL   RBC 2.56 (L) 4.22 - 5.81 MIL/uL   Hemoglobin 7.6 (L) 13.0 - 17.0 g/dL   HCT 22.5 (L) 39.0 - 52.0 %   MCV 87.9 78.0 - 100.0 fL   MCH 29.7 26.0 - 34.0 pg   MCHC 33.8 30.0 - 36.0 g/dL   RDW 16.4 (H) 11.5 - 15.5 %   Platelets 275 150 - 400 K/uL  Renal function panel  Result Value Ref Range   Sodium 133 (L) 135 - 145 mmol/L   Potassium 4.7 3.5 - 5.1 mmol/L   Chloride 95 (L) 101 - 111 mmol/L   CO2 27 22 - 32 mmol/L   Glucose, Bld 132 (H) 65 - 99 mg/dL   BUN 50 (H) 6 - 20 mg/dL   Creatinine, Ser 10.16 (H) 0.61 - 1.24 mg/dL   Calcium 8.8 (L) 8.9 - 10.3 mg/dL   Phosphorus 6.7 (H) 2.5 - 4.6 mg/dL   Albumin 2.6 (L) 3.5 - 5.0 g/dL   GFR calc non Af Amer 5 (L) >60 mL/min   GFR calc Af Amer 6 (L) >60 mL/min   Anion gap 11 5 - 15  Protime-INR  Result Value Ref Range   Prothrombin Time 24.6 (H)  11.4 - 15.2 seconds   INR 2.18   Glucose, capillary  Result Value Ref Range   Glucose-Capillary 86 65 - 99 mg/dL   Comment 1 Notify RN    Comment 2 Document in Chart   Glucose, capillary  Result Value Ref Range   Glucose-Capillary 103 (H) 65 - 99 mg/dL  Glucose, capillary  Result Value Ref Range   Glucose-Capillary 107 (H) 65 - 99 mg/dL  CBC  Result Value Ref Range   WBC 18.5 (H) 4.0 -  10.5 K/uL   RBC 2.50 (L) 4.22 - 5.81 MIL/uL   Hemoglobin 7.3 (L) 13.0 - 17.0 g/dL   HCT 22.4 (L) 39.0 - 52.0 %   MCV 89.6 78.0 - 100.0 fL   MCH 29.2 26.0 - 34.0 pg   MCHC 32.6 30.0 - 36.0 g/dL   RDW 16.1 (H) 11.5 - 15.5 %   Platelets 312 150 - 400 K/uL  Renal function panel  Result Value Ref Range   Sodium 134 (L) 135 - 145 mmol/L   Potassium 4.5 3.5 - 5.1 mmol/L   Chloride 96 (L) 101 - 111 mmol/L   CO2 28 22 - 32 mmol/L   Glucose, Bld 91 65 - 99 mg/dL   BUN 42 (H) 6 - 20 mg/dL   Creatinine, Ser 9.08 (H) 0.61 - 1.24 mg/dL   Calcium 9.3 8.9 - 10.3 mg/dL   Phosphorus 7.3 (H) 2.5 - 4.6 mg/dL   Albumin 2.8 (L) 3.5 - 5.0 g/dL   GFR calc non Af Amer 6 (L) >60 mL/min   GFR calc Af Amer 7 (L) >60 mL/min   Anion gap 10 5 - 15  Protime-INR  Result Value Ref Range   Prothrombin Time 17.5 (H) 11.4 - 15.2 seconds   INR 1.42   Glucose, capillary  Result Value Ref Range   Glucose-Capillary 110 (H) 65 - 99 mg/dL  Glucose, capillary  Result Value Ref Range   Glucose-Capillary 90 65 - 99 mg/dL   Comment 1 Notify RN    Comment 2 Document in Chart   Glucose, capillary  Result Value Ref Range   Glucose-Capillary 85 65 - 99 mg/dL   Comment 1 Notify RN    Comment 2 Document in Chart   Glucose, capillary  Result Value Ref Range   Glucose-Capillary 90 65 - 99 mg/dL  Glucose, capillary  Result Value Ref Range   Glucose-Capillary 90 65 - 99 mg/dL  Heparin level (unfractionated)  Result Value Ref Range   Heparin Unfractionated <0.10 (L) 0.30 - 0.70 IU/mL  Glucose, capillary  Result Value Ref Range    Glucose-Capillary 106 (H) 65 - 99 mg/dL  CBC  Result Value Ref Range   WBC 21.3 (H) 4.0 - 10.5 K/uL   RBC 2.47 (L) 4.22 - 5.81 MIL/uL   Hemoglobin 7.2 (L) 13.0 - 17.0 g/dL   HCT 22.2 (L) 39.0 - 52.0 %   MCV 89.9 78.0 - 100.0 fL   MCH 29.1 26.0 - 34.0 pg   MCHC 32.4 30.0 - 36.0 g/dL   RDW 16.3 (H) 11.5 - 15.5 %   Platelets 363 150 - 400 K/uL  Renal function panel  Result Value Ref Range   Sodium 134 (L) 135 - 145 mmol/L   Potassium 4.6 3.5 - 5.1 mmol/L   Chloride 94 (L) 101 - 111 mmol/L   CO2 25 22 - 32 mmol/L   Glucose, Bld 96 65 - 99 mg/dL   BUN 64 (H) 6 - 20 mg/dL   Creatinine, Ser 12.39 (H) 0.61 - 1.24 mg/dL   Calcium 9.3 8.9 - 10.3 mg/dL   Phosphorus 9.4 (H) 2.5 - 4.6 mg/dL   Albumin 3.0 (L) 3.5 - 5.0 g/dL   GFR calc non Af Amer 4 (L) >60 mL/min   GFR calc Af Amer 5 (L) >60 mL/min   Anion gap 15 5 - 15  Protime-INR  Result Value Ref Range   Prothrombin Time 18.0 (H) 11.4 - 15.2 seconds   INR 1.47   Heparin  level (unfractionated)  Result Value Ref Range   Heparin Unfractionated <0.10 (L) 0.30 - 0.70 IU/mL  Glucose, capillary  Result Value Ref Range   Glucose-Capillary 107 (H) 65 - 99 mg/dL   Comment 1 Notify RN    Comment 2 Document in Chart   Heparin level (unfractionated)  Result Value Ref Range   Heparin Unfractionated 0.85 (H) 0.30 - 0.70 IU/mL  Glucose, capillary  Result Value Ref Range   Glucose-Capillary 72 65 - 99 mg/dL  Glucose, capillary  Result Value Ref Range   Glucose-Capillary 119 (H) 65 - 99 mg/dL  CBC  Result Value Ref Range   WBC 31.8 (H) 4.0 - 10.5 K/uL   RBC 2.63 (L) 4.22 - 5.81 MIL/uL   Hemoglobin 7.7 (L) 13.0 - 17.0 g/dL   HCT 23.6 (L) 39.0 - 52.0 %   MCV 89.7 78.0 - 100.0 fL   MCH 29.3 26.0 - 34.0 pg   MCHC 32.6 30.0 - 36.0 g/dL   RDW 16.4 (H) 11.5 - 15.5 %   Platelets 353 150 - 400 K/uL  Renal function panel  Result Value Ref Range   Sodium 134 (L) 135 - 145 mmol/L   Potassium 4.1 3.5 - 5.1 mmol/L   Chloride 96 (L) 101 - 111  mmol/L   CO2 28 22 - 32 mmol/L   Glucose, Bld 91 65 - 99 mg/dL   BUN 37 (H) 6 - 20 mg/dL   Creatinine, Ser 9.45 (H) 0.61 - 1.24 mg/dL   Calcium 8.8 (L) 8.9 - 10.3 mg/dL   Phosphorus 7.0 (H) 2.5 - 4.6 mg/dL   Albumin 2.7 (L) 3.5 - 5.0 g/dL   GFR calc non Af Amer 6 (L) >60 mL/min   GFR calc Af Amer 7 (L) >60 mL/min   Anion gap 10 5 - 15  Protime-INR  Result Value Ref Range   Prothrombin Time 18.9 (H) 11.4 - 15.2 seconds   INR 1.56   Heparin level (unfractionated)  Result Value Ref Range   Heparin Unfractionated 0.92 (H) 0.30 - 0.70 IU/mL  Glucose, capillary  Result Value Ref Range   Glucose-Capillary 104 (H) 65 - 99 mg/dL  Glucose, capillary  Result Value Ref Range   Glucose-Capillary 117 (H) 65 - 99 mg/dL   Comment 1 Notify RN    Comment 2 Document in Chart   Glucose, capillary  Result Value Ref Range   Glucose-Capillary 101 (H) 65 - 99 mg/dL   Comment 1 Notify RN    Comment 2 Document in Chart   Heparin level (unfractionated)  Result Value Ref Range   Heparin Unfractionated 0.34 0.30 - 0.70 IU/mL  Glucose, capillary  Result Value Ref Range   Glucose-Capillary 110 (H) 65 - 99 mg/dL   Comment 1 Notify RN    Comment 2 Document in Chart   Glucose, capillary  Result Value Ref Range   Glucose-Capillary 97 65 - 99 mg/dL   Comment 1 Notify RN    Comment 2 Document in Chart   Protime-INR  Result Value Ref Range   Prothrombin Time 17.8 (H) 11.4 - 15.2 seconds   INR 1.46   CBC with Differential/Platelet  Result Value Ref Range   WBC 24.6 (H) 4.0 - 10.5 K/uL   RBC 2.30 (L) 4.22 - 5.81 MIL/uL   Hemoglobin 6.8 (LL) 13.0 - 17.0 g/dL   HCT 20.6 (L) 39.0 - 52.0 %   MCV 89.6 78.0 - 100.0 fL   MCH 29.6 26.0 - 34.0 pg  MCHC 33.0 30.0 - 36.0 g/dL   RDW 16.5 (H) 11.5 - 15.5 %   Platelets 284 150 - 400 K/uL   Neutrophils Relative % 84 %   Neutro Abs 20.7 (H) 1.7 - 7.7 K/uL   Lymphocytes Relative 9 %   Lymphs Abs 2.2 0.7 - 4.0 K/uL   Monocytes Relative 6 %   Monocytes  Absolute 1.5 (H) 0.1 - 1.0 K/uL   Eosinophils Relative 1 %   Eosinophils Absolute 0.2 0.0 - 0.7 K/uL   Basophils Relative 0 %   Basophils Absolute 0.0 0.0 - 0.1 K/uL  Glucose, capillary  Result Value Ref Range   Glucose-Capillary 109 (H) 65 - 99 mg/dL  Glucose, capillary  Result Value Ref Range   Glucose-Capillary 96 65 - 99 mg/dL  Glucose, capillary  Result Value Ref Range   Glucose-Capillary 86 65 - 99 mg/dL  Glucose, capillary  Result Value Ref Range   Glucose-Capillary 99 65 - 99 mg/dL  Type and screen [facility]  Result Value Ref Range   ABO/RH(D) A POS    Antibody Screen NEG    Sample Expiration 03/07/2016    Unit Number [id number]    Blood Component Type RED CELLS,LR    Unit division 00    Status of Unit ISSUED,FINAL    Transfusion Status OK TO TRANSFUSE    Crossmatch Result Compatible    Unit Number [id number]    Blood Component Type RED CELLS,LR    Unit division 00    Status of Unit REL FROM [facility]    Transfusion Status OK TO TRANSFUSE    Crossmatch Result Compatible    Unit Number [id number]    Blood Component Type RED CELLS,LR    Unit division 00    Status of Unit REL FROM [facility]    Transfusion Status OK TO TRANSFUSE    Crossmatch Result Compatible    Unit Number [id number]    Blood Component Type RED CELLS,LR    Unit division 00    Status of Unit REL FROM [facility]    Transfusion Status OK TO TRANSFUSE    Crossmatch Result Compatible   Prepare RBC  Result Value Ref Range   Order Confirmation ORDER PROCESSED BY BLOOD BANK    Scheduled Meds: . sodium chloride   Intravenous Once  . sodium chloride   Intravenous Once  . epoetin (EPOGEN/PROCRIT) injection  10,000 Units Intravenous Q T,Th,Sa-HD  . febuxostat  80 mg Oral Daily  . gabapentin  100 mg Oral Daily  . insulin aspart  0-15 Units Subcutaneous TID WC  . piperacillin-tazobactam (ZOSYN) IVPB  2.25 g Intravenous Q6H  . senna-docusate  2 tablet Oral BID  . sevelamer carbonate  3,200  mg Oral TID WC  . [START ON 03/10/2016] vancomycin  1,000 mg Intravenous Q M,W,F-HD   Continuous Infusions: . heparin 1,900 Units/hr (03/09/16 0630)   Principal Problem:   Bleeding from wound Active Problems:   Weakness of both legs   Arthritis, gouty   Hypertension   ESRD on dialysis ([place])   RBBB   Scrotal abscess   Type 2 diabetes mellitus (HCC)   Chronic anticoagulation   Constipation   Anemia in chronic kidney disease (CKD)   LOS: 5 days   Clanford Johnson 03/09/2016 7:51 AM

## 2016-03-09 NOTE — Progress Notes (Addendum)
ANTICOAGULATION CONSULT NOTE   Pharmacy Consult for Heparin Indication: VTE treatment  No Known Allergies  Patient Measurements: Height: 5\' 11"  (180.3 cm) Weight: 251 lb 1.7 oz (113.9 kg) IBW/kg (Calculated) : 75.3 HEPARIN DW (KG): 101.6  Vital Signs: Temp: 99 F (37.2 C) (02/04 0451) Temp Source: Oral (02/04 0451) BP: 103/56 (02/04 0451) Pulse Rate: 102 (02/04 0451)  Labs:  Recent Labs  03/07/16 0544  03/08/16 2878 03/08/16 1626 03/09/16 0701  HGB 7.2*  --  7.7*  --  6.8*  HCT 22.2*  --  23.6*  --  20.6*  PLT 363  --  353  --  284  LABPROT 18.0*  --  18.9*  --  17.8*  INR 1.47  --  1.56  --  1.46  HEPARINUNFRC <0.10*  < > 0.92* 0.34 0.24*  CREATININE 12.39*  --  9.45*  --   --   < > = values in this interval not displayed.  Estimated Creatinine Clearance: 12.3 mL/min (by C-G formula based on SCr of 9.45 mg/dL (H)).   Medical History: Past Medical History:  Diagnosis Date  . Anemia   . Arthritis   . BPH (benign prostatic hyperplasia)   . Chronic back pain   . Difficulty walking   . DVT (deep venous thrombosis) ([place])    Right popliteal DVT December 2017  . End-stage renal disease on hemodialysis ([place])    Dr. Lowanda Foster  . Erectile dysfunction   . Essential hypertension   . Gout   . Neuropathy, diabetic ([place])   . Type 2 diabetes mellitus ([place])   . Venous (peripheral) insufficiency     Medications:  Prescriptions Prior to Admission  Medication Sig Dispense Refill Last Dose  . clindamycin (CLEOCIN) 300 MG capsule Take 1 capsule (300 mg total) by mouth every 8 (eight) hours. 30 capsule 0 03/03/2016 at Unknown time  . colchicine 0.6 MG tablet 1/2 tab po 2 times /week (Patient taking differently: Take 0.3 mg by mouth daily as needed (for gout). ) 15 tablet 0 unknown  . gabapentin (NEURONTIN) 100 MG capsule Take 100 mg by mouth daily.    03/04/2016 at Unknown time  . Multiple Vitamin (DAILY VITE PO) Take 1 tablet by mouth daily.   unknown  .  sevelamer carbonate (RENVELA) 800 MG tablet Take 4 tablets (3,200 mg total) by mouth 3 (three) times daily with meals. 90 tablet 0 03/03/2016 at Unknown time  . sevelamer carbonate (RENVELA) 800 MG tablet Take 1 tablet (800 mg total) by mouth 2 (two) times daily between meals as needed (With snacks). 60 tablet 0 03/03/2016 at Unknown time  . sildenafil (REVATIO) 20 MG tablet Take 20 mg by mouth daily as needed. Dissolved under tongue (TROCHE) 60 minutes prior to sexual encounter   unknown  . traMADol (ULTRAM) 50 MG tablet Take 1 tablet (50 mg total) by mouth daily as needed (with dressing changes). 30 tablet 0 03/03/2016 at Unknown time  . ULORIC 80 MG TABS Take 80 mg by mouth daily.   03/03/2016 at Unknown time  . warfarin (COUMADIN) 5 MG tablet Take 2.5 mg by mouth daily. Take 1/2 tablet daily   Past Week at 1900    Assessment: 51 year old male with multiple medical problems including ESRD on HD,  recently discharge January 26 for scrotal abscess that was I/D. Presented to the ER 1/29 with an episode of significant bleeding from the wound in the context of ongoing anticoagulation for DVT.There is no ongoing bleeding. He was  transfused and given Vit. K 1mg  on 1/30. Asked to restart anticoagulation with heparin.  Heparin level this am 0.24 units/ml Goal of Therapy:  Anti-Xa level 0.3-7  Monitor platelets by anticoagulation protocol: Yes   Plan:  Increase heparin infusion to 2050 units/hr Check anti-Xa level in 8 hours and daily while on heparin Continue to monitor H&H and platelets  Thanks for allowing pharmacy to be a part of this patient's care.  Excell Seltzer, PharmD Clinical Pharmacist 03/09/2016,8:53 AM   Addum:  Heparin level therapeutic.  Noted Hg drop and getting blood transfusion.  Can we restart coumadin?   Cont heparin at present rate. F/u am labs

## 2016-03-09 NOTE — Progress Notes (Signed)
CRITICAL VALUE ALERT  Critical value received:  Hg 6.8  Date of notification:  03/09/16  Time of notification:  0730  Critical value read back:Yes.    Nurse who received alert:  Janace Aris RN  MD notified (1st page):  Dr. Wynetta Emery  Time of first page:  0732  Responding MD:  Dr Wynetta Emery, new orders to transfuse 1 unit RBBs

## 2016-03-09 NOTE — Progress Notes (Signed)
Patient has one IV access, receiving heparin continuously. Unable to stop heparin. Attempted to start a second IV access without success. Dr. Wynetta Emery notified. Ordered to wait and give blood tomorrow 03/10/16 during dialysis.

## 2016-03-10 DIAGNOSIS — M109 Gout, unspecified: Secondary | ICD-10-CM

## 2016-03-10 LAB — RENAL FUNCTION PANEL
ANION GAP: 16 — AB (ref 5–15)
Albumin: 2.3 g/dL — ABNORMAL LOW (ref 3.5–5.0)
BUN: 83 mg/dL — ABNORMAL HIGH (ref 6–20)
CALCIUM: 8.7 mg/dL — AB (ref 8.9–10.3)
CO2: 24 mmol/L (ref 22–32)
Chloride: 94 mmol/L — ABNORMAL LOW (ref 101–111)
Creatinine, Ser: 15.7 mg/dL — ABNORMAL HIGH (ref 0.61–1.24)
GFR calc non Af Amer: 3 mL/min — ABNORMAL LOW (ref >60)
GFR, EST AFRICAN AMERICAN: 4 mL/min — AB (ref >60)
Glucose, Bld: 98 mg/dL (ref 65–99)
PHOSPHORUS: 8.8 mg/dL — AB (ref 2.5–4.6)
Potassium: 4.5 mmol/L (ref 3.5–5.1)
SODIUM: 134 mmol/L — AB (ref 135–145)

## 2016-03-10 LAB — CBC WITH DIFFERENTIAL/PLATELET
Basophils Absolute: 0 10*3/uL (ref 0.0–0.1)
Basophils Relative: 0 %
EOS ABS: 0.3 10*3/uL (ref 0.0–0.7)
Eosinophils Relative: 1 %
HCT: 20.5 % — ABNORMAL LOW (ref 39.0–52.0)
HEMOGLOBIN: 6.8 g/dL — AB (ref 13.0–17.0)
LYMPHS ABS: 1.7 10*3/uL (ref 0.7–4.0)
LYMPHS PCT: 8 %
MCH: 29.7 pg (ref 26.0–34.0)
MCHC: 33.2 g/dL (ref 30.0–36.0)
MCV: 89.5 fL (ref 78.0–100.0)
Monocytes Absolute: 1.2 10*3/uL — ABNORMAL HIGH (ref 0.1–1.0)
Monocytes Relative: 5 %
NEUTROS ABS: 19.6 10*3/uL — AB (ref 1.7–7.7)
NEUTROS PCT: 86 %
Platelets: 316 10*3/uL (ref 150–400)
RBC: 2.29 MIL/uL — AB (ref 4.22–5.81)
RDW: 16.5 % — ABNORMAL HIGH (ref 11.5–15.5)
WBC: 22.8 10*3/uL — AB (ref 4.0–10.5)

## 2016-03-10 LAB — GLUCOSE, CAPILLARY
GLUCOSE-CAPILLARY: 110 mg/dL — AB (ref 65–99)
Glucose-Capillary: 102 mg/dL — ABNORMAL HIGH (ref 65–99)
Glucose-Capillary: 103 mg/dL — ABNORMAL HIGH (ref 65–99)
Glucose-Capillary: 127 mg/dL — ABNORMAL HIGH (ref 65–99)
Glucose-Capillary: 99 mg/dL (ref 65–99)

## 2016-03-10 LAB — HEPARIN LEVEL (UNFRACTIONATED): Heparin Unfractionated: 0.33 IU/mL (ref 0.30–0.70)

## 2016-03-10 LAB — PROTIME-INR
INR: 1.4
Prothrombin Time: 17.3 seconds — ABNORMAL HIGH (ref 11.4–15.2)

## 2016-03-10 MED ORDER — SODIUM CHLORIDE 0.9 % IV SOLN: 100.0000 mL | INTRAVENOUS | Status: DC | PRN

## 2016-03-10 MED ORDER — HEPARIN SODIUM (PORCINE) 1000 UNIT/ML DIALYSIS
1000.0000 [IU] | INTRAMUSCULAR | Status: DC | PRN
  Administered 2016-03-10: 1000 [IU] via INTRAVENOUS_CENTRAL
  Filled 2016-03-10 (×2): qty 1

## 2016-03-10 MED ORDER — HEPARIN SODIUM (PORCINE) 1000 UNIT/ML IJ SOLN
INTRAMUSCULAR | Status: AC
  Administered 2016-03-10: 1000 [IU] via INTRAVENOUS_CENTRAL
  Filled 2016-03-10: qty 1

## 2016-03-10 MED ORDER — ALTEPLASE 2 MG IJ SOLR
2.0000 mg | Freq: Once | INTRAMUSCULAR | Status: DC | PRN
  Filled 2016-03-10: qty 2

## 2016-03-10 MED ORDER — EPOETIN ALFA 10000 UNIT/ML IJ SOLN
INTRAMUSCULAR | Status: AC
  Administered 2016-03-10: 10000 [IU] via INTRAVENOUS
  Filled 2016-03-10: qty 1

## 2016-03-10 MED ORDER — EPOETIN ALFA 10000 UNIT/ML IJ SOLN
10000.0000 [IU] | INTRAMUSCULAR | Status: DC
  Administered 2016-03-10 – 2016-03-12 (×2): 10000 [IU] via INTRAVENOUS
  Filled 2016-03-10 (×3): qty 1

## 2016-03-10 MED ORDER — PIPERACILLIN-TAZOBACTAM 3.375 G IVPB
3.3750 g | Freq: Two times a day (BID) | INTRAVENOUS | Status: DC
  Administered 2016-03-10 – 2016-03-11 (×4): 3.375 g via INTRAVENOUS
  Filled 2016-03-10 (×6): qty 50

## 2016-03-10 MED ORDER — LIDOCAINE HCL (PF) 1 % IJ SOLN: 5.0000 mL | INTRAMUSCULAR | Status: DC | PRN

## 2016-03-10 MED ORDER — SEVELAMER CARBONATE 800 MG PO TABS: 1600.0000 mg | ORAL_TABLET | Freq: Two times a day (BID) | ORAL | Status: DC | PRN

## 2016-03-10 MED ORDER — PIPERACILLIN SOD-TAZOBACTAM SO 2.25 (2-0.25) G IV SOLR: 3.3750 g | Freq: Two times a day (BID) | INTRAVENOUS | Status: DC

## 2016-03-10 NOTE — Progress Notes (Signed)
PROGRESS NOTE  Jack Irwin HKV:[id number] DOB: 1965-09-03 DOA: 03/03/2016 PCP: Kingsley Callander, FNP  Brief Narrative: 51 year old male with multiple medical problems status post scrotal abscess incision and drainage with discharge January 26, with oral antibiotics and daily dressing changes/packing. Presented to the ER 1/29 with an episode of significant bleeding from the wound in the context of ongoing anticoagulation for DVT. Plans were made for observation, trending of hemoglobin, monitoring of INR and urology follow-up anticipated 1/30 in the morning.  Assessment/Plan 1. Bleeding from scrotal wound - improving.  Hg continuing to trend down. Hemoglobin now 6.8.  Ordered for 1 unit PRBC during HD 2/5.   INR now less than 2.  On heparin for anticoagulation. Follow INR.     2. Anemia of chronic disease renal failure. Transfuse 1 unit packed red blood cells during hemodialysis 2/5.  Following CBC.  3. Leukocytosis - WBC slightly improved with broader spectrum antibiotic coverage but still high at 22. Following.  No fever and blood cultures no growth to date.     4. Scrotal abscess status post incision and drainage prior to admission. Broaden antibiotic coverage zosyn and vanc IV.  5. Right lower extremity DVT December 2017. Holding warfarin for now.  Started heparin drip now that INR less than 2.   6. End-stage renal disease. Management per nephrology.  HD per nephrology team  Had HD 2/2.  Plan for HD 2/5 with 1 unit PRBC. 7. Diabetes mellitus type 2 with diabetic neuropathy.  BS well controlled.  8. Severe Constipation - Pt had large BMs, continue laxatives.  9. Tachycardia - sinus tach suspect secondary to infection, following. No signs of sepsis physiology at this time.  10. IV infiltration RUE - continue warm compresses and elevation.     DVT prophylaxis: SCDs heparin Code Status: full code Family Communication: wife Disposition Plan: home   03/10/2016, 1:08 PM  LOS: 6 days    Consultants:  Nephrology  Urology  Procedures:   I&D ?  Antimicrobials:  Clindamycin (on prior to admission)  Zosyn/vanc 2/3  Interval history/Subjective: Pt is wanting to go home.  He says that the scrotal swelling has not changed and wanting to try ice packs to the perineum.  Objective: Vitals:   03/10/16 1100 03/10/16 1130 03/10/16 1200 03/10/16 1205  BP: (!) 109/58 124/60 (!) 106/53 124/68  Pulse: 98 96 90 72  Resp:    20  Temp:    98.5 F (36.9 C)  TempSrc:    Oral  SpO2:      Weight:      Height:        Intake/Output Summary (Last 24 hours) at 03/10/16 1308 Last data filed at 03/10/16 1200  Gross per 24 hour  Intake          1664.73 ml  Output             1465 ml  Net           199.73 ml     Filed Weights   03/05/16 1000 03/07/16 1120 03/10/16 0745  Weight: 114.6 kg (252 lb 10.4 oz) 113.9 kg (251 lb 1.7 oz) 113.9 kg (251 lb 1.7 oz)    Exam:    Constitutional: Appears calm, comfortable. Cardiovascular regular rate and rhythm. No murmur, rub or gallop. Respiratory. Clear to auscultation bilaterally. No wheezes, rales or rhonchi. Normal respiratory effort. Abdomen: soft, no masses palpated, normal BS.  Scrotum continues to be swollen with indurated area in pubic area but  no active bleeding seen. Ext: swollen RUE from IV infiltration improving from prior exam.  Results for orders placed or performed during the hospital encounter of 03/03/16  Culture, blood (Routine X 2) w Reflex to ID Panel  Result Value Ref Range   Specimen Description BLOOD BLOOD RIGHT HAND    Special Requests BOTTLES DRAWN AEROBIC AND ANAEROBIC 5CC    Culture NO GROWTH 2 DAYS    Report Status PENDING   Culture, blood (Routine X 2) w Reflex to ID Panel  Result Value Ref Range   Specimen Description BLOOD BLOOD RIGHT HAND    Special Requests BOTTLES DRAWN AEROBIC AND ANAEROBIC 6CC    Culture NO GROWTH 2 DAYS    Report Status PENDING   CBC  Result Value Ref Range   WBC 12.5  (H) 4.0 - 10.5 K/uL   RBC 3.32 (L) 4.22 - 5.81 MIL/uL   Hemoglobin 9.6 (L) 13.0 - 17.0 g/dL   HCT 29.2 (L) 39.0 - 52.0 %   MCV 88.0 78.0 - 100.0 fL   MCH 28.9 26.0 - 34.0 pg   MCHC 32.9 30.0 - 36.0 g/dL   RDW 16.2 (H) 11.5 - 15.5 %   Platelets 270 150 - 400 K/uL  Protime-INR  Result Value Ref Range   Prothrombin Time 35.7 (H) 11.4 - 15.2 seconds   INR 0.94   Basic metabolic panel  Result Value Ref Range   Sodium 133 (L) 135 - 145 mmol/L   Potassium 4.9 3.5 - 5.1 mmol/L   Chloride 93 (L) 101 - 111 mmol/L   CO2 22 22 - 32 mmol/L   Glucose, Bld 77 65 - 99 mg/dL   BUN 98 (H) 6 - 20 mg/dL   Creatinine, Ser 15.50 (H) 0.61 - 1.24 mg/dL   Calcium 9.0 8.9 - 10.3 mg/dL   GFR calc non Af Amer 3 (L) >60 mL/min   GFR calc Af Amer 4 (L) >60 mL/min   Anion gap 18 (H) 5 - 15  Glucose, capillary  Result Value Ref Range   Glucose-Capillary 68 65 - 99 mg/dL   Comment 1 Notify RN    Comment 2 Document in Chart   Basic metabolic panel  Result Value Ref Range   Sodium 134 (L) 135 - 145 mmol/L   Potassium 5.4 (H) 3.5 - 5.1 mmol/L   Chloride 94 (L) 101 - 111 mmol/L   CO2 22 22 - 32 mmol/L   Glucose, Bld 92 65 - 99 mg/dL   BUN 112 (H) 6 - 20 mg/dL   Creatinine, Ser 17.23 (H) 0.61 - 1.24 mg/dL   Calcium 8.8 (L) 8.9 - 10.3 mg/dL   GFR calc non Af Amer 3 (L) >60 mL/min   GFR calc Af Amer 3 (L) >60 mL/min   Anion gap 18 (H) 5 - 15  CBC  Result Value Ref Range   WBC 12.2 (H) 4.0 - 10.5 K/uL   RBC 2.67 (L) 4.22 - 5.81 MIL/uL   Hemoglobin 7.6 (L) 13.0 - 17.0 g/dL   HCT 23.5 (L) 39.0 - 52.0 %   MCV 88.0 78.0 - 100.0 fL   MCH 28.5 26.0 - 34.0 pg   MCHC 32.3 30.0 - 36.0 g/dL   RDW 15.5 11.5 - 15.5 %   Platelets 266 150 - 400 K/uL  Protime-INR  Result Value Ref Range   Prothrombin Time 37.4 (H) 11.4 - 15.2 seconds   INR 3.68   Glucose, capillary  Result Value Ref Range  Glucose-Capillary 114 (H) 65 - 99 mg/dL   Comment 1 Notify RN    Comment 2 Document in Chart   Glucose, capillary    Result Value Ref Range   Glucose-Capillary 97 65 - 99 mg/dL   Comment 1 Notify RN    Comment 2 Document in Chart   CBC  Result Value Ref Range   WBC 13.4 (H) 4.0 - 10.5 K/uL   RBC 2.63 (L) 4.22 - 5.81 MIL/uL   Hemoglobin 7.8 (L) 13.0 - 17.0 g/dL   HCT 22.8 (L) 39.0 - 52.0 %   MCV 86.7 78.0 - 100.0 fL   MCH 29.7 26.0 - 34.0 pg   MCHC 34.2 30.0 - 36.0 g/dL   RDW 16.4 (H) 11.5 - 15.5 %   Platelets 258 150 - 400 K/uL  Glucose, capillary  Result Value Ref Range   Glucose-Capillary 89 65 - 99 mg/dL   Comment 1 Notify RN    Comment 2 Document in Chart   CBC  Result Value Ref Range   WBC 15.0 (H) 4.0 - 10.5 K/uL   RBC 2.65 (L) 4.22 - 5.81 MIL/uL   Hemoglobin 7.8 (L) 13.0 - 17.0 g/dL   HCT 23.0 (L) 39.0 - 52.0 %   MCV 86.8 78.0 - 100.0 fL   MCH 29.4 26.0 - 34.0 pg   MCHC 33.9 30.0 - 36.0 g/dL   RDW 16.3 (H) 11.5 - 15.5 %   Platelets 294 150 - 400 K/uL  Glucose, capillary  Result Value Ref Range   Glucose-Capillary 85 65 - 99 mg/dL   Comment 1 Notify RN    Comment 2 Document in Chart   Glucose, capillary  Result Value Ref Range   Glucose-Capillary 91 65 - 99 mg/dL   Comment 1 Notify RN    Comment 2 Document in Chart   CBC  Result Value Ref Range   WBC 14.2 (H) 4.0 - 10.5 K/uL   RBC 2.56 (L) 4.22 - 5.81 MIL/uL   Hemoglobin 7.6 (L) 13.0 - 17.0 g/dL   HCT 22.5 (L) 39.0 - 52.0 %   MCV 87.9 78.0 - 100.0 fL   MCH 29.7 26.0 - 34.0 pg   MCHC 33.8 30.0 - 36.0 g/dL   RDW 16.4 (H) 11.5 - 15.5 %   Platelets 275 150 - 400 K/uL  Renal function panel  Result Value Ref Range   Sodium 133 (L) 135 - 145 mmol/L   Potassium 4.7 3.5 - 5.1 mmol/L   Chloride 95 (L) 101 - 111 mmol/L   CO2 27 22 - 32 mmol/L   Glucose, Bld 132 (H) 65 - 99 mg/dL   BUN 50 (H) 6 - 20 mg/dL   Creatinine, Ser 10.16 (H) 0.61 - 1.24 mg/dL   Calcium 8.8 (L) 8.9 - 10.3 mg/dL   Phosphorus 6.7 (H) 2.5 - 4.6 mg/dL   Albumin 2.6 (L) 3.5 - 5.0 g/dL   GFR calc non Af Amer 5 (L) >60 mL/min   GFR calc Af Amer 6 (L) >60  mL/min   Anion gap 11 5 - 15  Protime-INR  Result Value Ref Range   Prothrombin Time 24.6 (H) 11.4 - 15.2 seconds   INR 2.18   Glucose, capillary  Result Value Ref Range   Glucose-Capillary 86 65 - 99 mg/dL   Comment 1 Notify RN    Comment 2 Document in Chart   Glucose, capillary  Result Value Ref Range   Glucose-Capillary 103 (H) 65 - 99 mg/dL  Glucose, capillary  Result Value Ref Range   Glucose-Capillary 107 (H) 65 - 99 mg/dL  CBC  Result Value Ref Range   WBC 18.5 (H) 4.0 - 10.5 K/uL   RBC 2.50 (L) 4.22 - 5.81 MIL/uL   Hemoglobin 7.3 (L) 13.0 - 17.0 g/dL   HCT 22.4 (L) 39.0 - 52.0 %   MCV 89.6 78.0 - 100.0 fL   MCH 29.2 26.0 - 34.0 pg   MCHC 32.6 30.0 - 36.0 g/dL   RDW 16.1 (H) 11.5 - 15.5 %   Platelets 312 150 - 400 K/uL  Renal function panel  Result Value Ref Range   Sodium 134 (L) 135 - 145 mmol/L   Potassium 4.5 3.5 - 5.1 mmol/L   Chloride 96 (L) 101 - 111 mmol/L   CO2 28 22 - 32 mmol/L   Glucose, Bld 91 65 - 99 mg/dL   BUN 42 (H) 6 - 20 mg/dL   Creatinine, Ser 9.08 (H) 0.61 - 1.24 mg/dL   Calcium 9.3 8.9 - 10.3 mg/dL   Phosphorus 7.3 (H) 2.5 - 4.6 mg/dL   Albumin 2.8 (L) 3.5 - 5.0 g/dL   GFR calc non Af Amer 6 (L) >60 mL/min   GFR calc Af Amer 7 (L) >60 mL/min   Anion gap 10 5 - 15  Protime-INR  Result Value Ref Range   Prothrombin Time 17.5 (H) 11.4 - 15.2 seconds   INR 1.42   Glucose, capillary  Result Value Ref Range   Glucose-Capillary 110 (H) 65 - 99 mg/dL  Glucose, capillary  Result Value Ref Range   Glucose-Capillary 90 65 - 99 mg/dL   Comment 1 Notify RN    Comment 2 Document in Chart   Glucose, capillary  Result Value Ref Range   Glucose-Capillary 85 65 - 99 mg/dL   Comment 1 Notify RN    Comment 2 Document in Chart   Glucose, capillary  Result Value Ref Range   Glucose-Capillary 90 65 - 99 mg/dL  Glucose, capillary  Result Value Ref Range   Glucose-Capillary 90 65 - 99 mg/dL  Heparin level (unfractionated)  Result Value Ref Range     Heparin Unfractionated <0.10 (L) 0.30 - 0.70 IU/mL  Glucose, capillary  Result Value Ref Range   Glucose-Capillary 106 (H) 65 - 99 mg/dL  CBC  Result Value Ref Range   WBC 21.3 (H) 4.0 - 10.5 K/uL   RBC 2.47 (L) 4.22 - 5.81 MIL/uL   Hemoglobin 7.2 (L) 13.0 - 17.0 g/dL   HCT 22.2 (L) 39.0 - 52.0 %   MCV 89.9 78.0 - 100.0 fL   MCH 29.1 26.0 - 34.0 pg   MCHC 32.4 30.0 - 36.0 g/dL   RDW 16.3 (H) 11.5 - 15.5 %   Platelets 363 150 - 400 K/uL  Renal function panel  Result Value Ref Range   Sodium 134 (L) 135 - 145 mmol/L   Potassium 4.6 3.5 - 5.1 mmol/L   Chloride 94 (L) 101 - 111 mmol/L   CO2 25 22 - 32 mmol/L   Glucose, Bld 96 65 - 99 mg/dL   BUN 64 (H) 6 - 20 mg/dL   Creatinine, Ser 12.39 (H) 0.61 - 1.24 mg/dL   Calcium 9.3 8.9 - 10.3 mg/dL   Phosphorus 9.4 (H) 2.5 - 4.6 mg/dL   Albumin 3.0 (L) 3.5 - 5.0 g/dL   GFR calc non Af Amer 4 (L) >60 mL/min   GFR calc Af Amer 5 (L) >60 mL/min  Anion gap 15 5 - 15  Protime-INR  Result Value Ref Range   Prothrombin Time 18.0 (H) 11.4 - 15.2 seconds   INR 1.47   Heparin level (unfractionated)  Result Value Ref Range   Heparin Unfractionated <0.10 (L) 0.30 - 0.70 IU/mL  Glucose, capillary  Result Value Ref Range   Glucose-Capillary 107 (H) 65 - 99 mg/dL   Comment 1 Notify RN    Comment 2 Document in Chart   Heparin level (unfractionated)  Result Value Ref Range   Heparin Unfractionated 0.85 (H) 0.30 - 0.70 IU/mL  Glucose, capillary  Result Value Ref Range   Glucose-Capillary 72 65 - 99 mg/dL  Glucose, capillary  Result Value Ref Range   Glucose-Capillary 119 (H) 65 - 99 mg/dL  CBC  Result Value Ref Range   WBC 31.8 (H) 4.0 - 10.5 K/uL   RBC 2.63 (L) 4.22 - 5.81 MIL/uL   Hemoglobin 7.7 (L) 13.0 - 17.0 g/dL   HCT 23.6 (L) 39.0 - 52.0 %   MCV 89.7 78.0 - 100.0 fL   MCH 29.3 26.0 - 34.0 pg   MCHC 32.6 30.0 - 36.0 g/dL   RDW 16.4 (H) 11.5 - 15.5 %   Platelets 353 150 - 400 K/uL  Renal function panel  Result Value Ref  Range   Sodium 134 (L) 135 - 145 mmol/L   Potassium 4.1 3.5 - 5.1 mmol/L   Chloride 96 (L) 101 - 111 mmol/L   CO2 28 22 - 32 mmol/L   Glucose, Bld 91 65 - 99 mg/dL   BUN 37 (H) 6 - 20 mg/dL   Creatinine, Ser 9.45 (H) 0.61 - 1.24 mg/dL   Calcium 8.8 (L) 8.9 - 10.3 mg/dL   Phosphorus 7.0 (H) 2.5 - 4.6 mg/dL   Albumin 2.7 (L) 3.5 - 5.0 g/dL   GFR calc non Af Amer 6 (L) >60 mL/min   GFR calc Af Amer 7 (L) >60 mL/min   Anion gap 10 5 - 15  Protime-INR  Result Value Ref Range   Prothrombin Time 18.9 (H) 11.4 - 15.2 seconds   INR 1.56   Heparin level (unfractionated)  Result Value Ref Range   Heparin Unfractionated 0.92 (H) 0.30 - 0.70 IU/mL  Glucose, capillary  Result Value Ref Range   Glucose-Capillary 104 (H) 65 - 99 mg/dL  Glucose, capillary  Result Value Ref Range   Glucose-Capillary 117 (H) 65 - 99 mg/dL   Comment 1 Notify RN    Comment 2 Document in Chart   Glucose, capillary  Result Value Ref Range   Glucose-Capillary 101 (H) 65 - 99 mg/dL   Comment 1 Notify RN    Comment 2 Document in Chart   Heparin level (unfractionated)  Result Value Ref Range   Heparin Unfractionated 0.34 0.30 - 0.70 IU/mL  Glucose, capillary  Result Value Ref Range   Glucose-Capillary 110 (H) 65 - 99 mg/dL   Comment 1 Notify RN    Comment 2 Document in Chart   Glucose, capillary  Result Value Ref Range   Glucose-Capillary 97 65 - 99 mg/dL   Comment 1 Notify RN    Comment 2 Document in Chart   Protime-INR  Result Value Ref Range   Prothrombin Time 17.8 (H) 11.4 - 15.2 seconds   INR 1.46   Heparin level (unfractionated)  Result Value Ref Range   Heparin Unfractionated 0.24 (L) 0.30 - 0.70 IU/mL  CBC with Differential/Platelet  Result Value Ref Range   WBC 24.6 (  H) 4.0 - 10.5 K/uL   RBC 2.30 (L) 4.22 - 5.81 MIL/uL   Hemoglobin 6.8 (LL) 13.0 - 17.0 g/dL   HCT 20.6 (L) 39.0 - 52.0 %   MCV 89.6 78.0 - 100.0 fL   MCH 29.6 26.0 - 34.0 pg   MCHC 33.0 30.0 - 36.0 g/dL   RDW 16.5 (H) 11.5 -  15.5 %   Platelets 284 150 - 400 K/uL   Neutrophils Relative % 84 %   Neutro Abs 20.7 (H) 1.7 - 7.7 K/uL   Lymphocytes Relative 9 %   Lymphs Abs 2.2 0.7 - 4.0 K/uL   Monocytes Relative 6 %   Monocytes Absolute 1.5 (H) 0.1 - 1.0 K/uL   Eosinophils Relative 1 %   Eosinophils Absolute 0.2 0.0 - 0.7 K/uL   Basophils Relative 0 %   Basophils Absolute 0.0 0.0 - 0.1 K/uL  Glucose, capillary  Result Value Ref Range   Glucose-Capillary 109 (H) 65 - 99 mg/dL  Glucose, capillary  Result Value Ref Range   Glucose-Capillary 96 65 - 99 mg/dL  Glucose, capillary  Result Value Ref Range   Glucose-Capillary 86 65 - 99 mg/dL  Glucose, capillary  Result Value Ref Range   Glucose-Capillary 99 65 - 99 mg/dL  Heparin level (unfractionated)  Result Value Ref Range   Heparin Unfractionated 0.44 0.30 - 0.70 IU/mL  Glucose, capillary  Result Value Ref Range   Glucose-Capillary 95 65 - 99 mg/dL   Comment 1 Notify RN    Comment 2 Document in Chart   Glucose, capillary  Result Value Ref Range   Glucose-Capillary 71 65 - 99 mg/dL  Protime-INR  Result Value Ref Range   Prothrombin Time 17.3 (H) 11.4 - 15.2 seconds   INR 1.40   Heparin level (unfractionated)  Result Value Ref Range   Heparin Unfractionated 0.33 0.30 - 0.70 IU/mL  CBC with Differential/Platelet  Result Value Ref Range   WBC 22.8 (H) 4.0 - 10.5 K/uL   RBC 2.29 (L) 4.22 - 5.81 MIL/uL   Hemoglobin 6.8 (LL) 13.0 - 17.0 g/dL   HCT 20.5 (L) 39.0 - 52.0 %   MCV 89.5 78.0 - 100.0 fL   MCH 29.7 26.0 - 34.0 pg   MCHC 33.2 30.0 - 36.0 g/dL   RDW 16.5 (H) 11.5 - 15.5 %   Platelets 316 150 - 400 K/uL   Neutrophils Relative % 86 %   Neutro Abs 19.6 (H) 1.7 - 7.7 K/uL   Lymphocytes Relative 8 %   Lymphs Abs 1.7 0.7 - 4.0 K/uL   Monocytes Relative 5 %   Monocytes Absolute 1.2 (H) 0.1 - 1.0 K/uL   Eosinophils Relative 1 %   Eosinophils Absolute 0.3 0.0 - 0.7 K/uL   Basophils Relative 0 %   Basophils Absolute 0.0 0.0 - 0.1 K/uL  Renal  function panel  Result Value Ref Range   Sodium 134 (L) 135 - 145 mmol/L   Potassium 4.5 3.5 - 5.1 mmol/L   Chloride 94 (L) 101 - 111 mmol/L   CO2 24 22 - 32 mmol/L   Glucose, Bld 98 65 - 99 mg/dL   BUN 83 (H) 6 - 20 mg/dL   Creatinine, Ser 15.70 (H) 0.61 - 1.24 mg/dL   Calcium 8.7 (L) 8.9 - 10.3 mg/dL   Phosphorus 8.8 (H) 2.5 - 4.6 mg/dL   Albumin 2.3 (L) 3.5 - 5.0 g/dL   GFR calc non Af Amer 3 (L) >60 mL/min   GFR calc  Af Amer 4 (L) >60 mL/min   Anion gap 16 (H) 5 - 15  Glucose, capillary  Result Value Ref Range   Glucose-Capillary 108 (H) 65 - 99 mg/dL   Comment 1 Notify RN    Comment 2 Document in Chart   Glucose, capillary  Result Value Ref Range   Glucose-Capillary 102 (H) 65 - 99 mg/dL   Comment 1 Notify RN    Comment 2 Document in Chart   Glucose, capillary  Result Value Ref Range   Glucose-Capillary 103 (H) 65 - 99 mg/dL  Type and screen [facility]  Result Value Ref Range   ABO/RH(D) A POS    Antibody Screen NEG    Sample Expiration 03/07/2016    Unit Number [id number]    Blood Component Type RED CELLS,LR    Unit division 00    Status of Unit ISSUED,FINAL    Transfusion Status OK TO TRANSFUSE    Crossmatch Result Compatible    Unit Number [id number]    Blood Component Type RED CELLS,LR    Unit division 00    Status of Unit REL FROM [facility]    Transfusion Status OK TO TRANSFUSE    Crossmatch Result Compatible    Unit Number [id number]    Blood Component Type RED CELLS,LR    Unit division 00    Status of Unit REL FROM [facility]    Transfusion Status OK TO TRANSFUSE    Crossmatch Result Compatible    Unit Number [id number]    Blood Component Type RED CELLS,LR    Unit division 00    Status of Unit REL FROM [facility]    Transfusion Status OK TO TRANSFUSE    Crossmatch Result Compatible   Prepare RBC  Result Value Ref Range   Order Confirmation ORDER PROCESSED BY BLOOD BANK   Type and screen [facility]  Result Value Ref Range    ABO/RH(D) A POS    Antibody Screen NEG    Sample Expiration 03/12/2016    Unit Number [id number]    Blood Component Type RED CELLS,LR    Unit division 00    Status of Unit ISSUED    Transfusion Status OK TO TRANSFUSE    Crossmatch Result Compatible   Prepare RBC  Result Value Ref Range   Order Confirmation ORDER PROCESSED BY BLOOD BANK    Scheduled Meds: . sodium chloride   Intravenous Once  . sodium chloride   Intravenous Once  . epoetin (EPOGEN/PROCRIT) injection  10,000 Units Intravenous Q M,W,F-HD  . febuxostat  80 mg Oral Daily  . gabapentin  100 mg Oral Daily  . insulin aspart  0-15 Units Subcutaneous TID WC  . piperacillin-tazobactam (ZOSYN)  IV  3.375 g Intravenous Q12H  . senna-docusate  2 tablet Oral BID  . sevelamer carbonate  3,200 mg Oral TID WC  . vancomycin  1,000 mg Intravenous Q M,W,F-HD   Continuous Infusions: . heparin 2,050 Units/hr (03/10/16 0826)   Principal Problem:   Bleeding from wound Active Problems:   Weakness of both legs   Arthritis, gouty   Hypertension   ESRD on dialysis [facility])   RBBB   Scrotal abscess   Type 2 diabetes mellitus (HCC)   Chronic anticoagulation   Constipation   Anemia in chronic kidney disease (CKD)   LOS: 6 days   Clanford Johnson 03/10/2016 1:08 PM

## 2016-03-10 NOTE — Progress Notes (Signed)
Subjective: Interval History: Patient is seen on hemodialysis. He offers no complaints. Patient denies any difficulty breathing. He denies also any weakness.  Objective: Vital signs in last 24 hours: Temp:  [98.3 F (36.8 C)-98.6 F (37 C)] 98.6 F (37 C) (02/05 0745) Pulse Rate:  [94-111] 101 (02/05 0930) Resp:  [18] 18 (02/05 0745) BP: (103-144)/(58-68) 127/66 (02/05 0930) SpO2:  [95 %-100 %] 95 % (02/05 0745) Weight:  [113.9 kg (251 lb 1.7 oz)] 113.9 kg (251 lb 1.7 oz) (02/05 0745) Weight change:   Intake/Output from previous day: 02/04 0701 - 02/05 0700 In: 1674.7 [P.O.:720; I.V.:754.7; IV Piggyback:200] Out: 250 [Urine:250] Intake/Output this shift: No intake/output data recorded.  General appearance: alert, cooperative and no distress Resp: clear to auscultation bilaterally Cardio: regular rate and rhythm Male genitalia: Patient with scrotal swelling and edema. Extremities: venous stasis dermatitis noted  Lab Results:  Recent Labs  03/09/16 0701 03/10/16 0616  WBC 24.6* 22.8*  HGB 6.8* 6.8*  HCT 20.6* 20.5*  PLT 284 316   BMET:   Recent Labs  03/08/16 0632 03/10/16 0616  NA 134* 134*  K 4.1 4.5  CL 96* 94*  CO2 28 24  GLUCOSE 91 98  BUN 37* 83*  CREATININE 9.45* 15.70*  CALCIUM 8.8* 8.7*   No results for input(s): PTH in the last 72 hours. Iron Studies: No results for input(s): IRON, TIBC, TRANSFERRIN, FERRITIN in the last 72 hours.  Studies/Results: No results found.  I have reviewed the patient's current medications.  Assessment/Plan: Problem #1 scrotal abscess: Status D&C. Patient is on antibiotics.  Problem #2 end-stage renal disease: Patient presently on dialysis. His potassium is normal. Patient offers no complaints. Problem #3 anemia: Possibly a combination of bleeding and anemia of chronic disease. His hemoglobin is low and remains stable. Problem #4 metabolic bone disease: His calcium is range but phosphorus is high And worsening. He  is on Renvela 800 mg 4 tablets by mouth 3 times a day with meals and 800 with snack. Problem #5 fluid management: Patient denies any difficulty breathing. Problem #6 history of cardiomyopathy Problem #7 history of obesity Plan: 1] will transfuse packed red blood cells on dialysis. 2] we'll check his renal panel and CBC in the morning. 3] we'll continue with Epogen 4] we'll try to move about 2 L his blood pressure remains stable. 5] we'll change Renvela to 800 mg 4 tablets by mouth 3 times a day with meals and 2 with snack.  LOS: 6 days   Arlon Bleier S 03/10/2016,10:04 AM

## 2016-03-10 NOTE — Evaluation (Signed)
Physical Therapy Evaluation Patient Details Name: Jack Irwin MRN: [id number] DOB: [birth year] Today's Date: 03/10/2016   History of Present Illness  51 year old male with multiple medical problems status post scrotal abscess incision and drainage with discharge January 26, with oral antibiotics and daily dressing changes/packing. Presented to the ER 1/29 with an episode of significant bleeding from the wound in the context of ongoing anticoagulation for DVT. Plans were made for observation, trending of hemoglobin, monitoring of INR and urology follow-up  Clinical Impression  Pt received in bed, and was agreeable to PT evaluation.  Pt expressed that he is normally independent for ambulation, but he is only able to ambulate for 5 min at a time.  Pt reports that he is independent with dressing, bathing, driving and limited community ambulation.  During PT evaluation, he required Min A for supine<>sit, and Min A for sit<>stand.  He was able to ambulate 39ft with RW and min guard.  At this point, he would benefit from OPPT to improve strength, balance, and endurance required to return to PLOF.    Follow Up Recommendations Outpatient PT    Equipment Recommendations  Rolling walker with 5" wheels;3in1 (PT)    Recommendations for Other Services       Precautions / Restrictions Precautions Precautions: None Restrictions Weight Bearing Restrictions: No      Mobility  Bed Mobility Overal bed mobility: Needs Assistance Bed Mobility: Supine to Sit     Supine to sit: Min assist     General bed mobility comments: assist to lift trunk up off the bed, and increased time.   Transfers Overall transfer level: Needs assistance Equipment used: Rolling walker (2 wheeled) Transfers: Sit to/from Stand Sit to Stand: From elevated surface;Min assist            Ambulation/Gait Ambulation/Gait assistance: Min guard Ambulation Distance (Feet): 25 Feet Assistive device: Rolling walker (2  wheeled) Gait Pattern/deviations: Step-to pattern;Trunk flexed   Gait velocity interpretation: <1.8 ft/sec, indicative of risk for recurrent falls General Gait Details: Pt demonstrates very flexed posture during gait - likely due to pain.  He required 2 standing rest breaks due to fatigue.    Stairs            Wheelchair Mobility    Modified Rankin (Stroke Patients Only)       Balance Overall balance assessment: Needs assistance Sitting-balance support: Feet supported;No upper extremity supported Sitting balance-Leahy Scale: Normal     Standing balance support: Bilateral upper extremity supported Standing balance-Leahy Scale: Fair Standing balance comment: B UE support on  RW                             Pertinent Vitals/Pain Pain Assessment: 0-10 Pain Score: 10-Worst pain ever Pain Location: Feet, and under abdomen Pain Descriptors / Indicators: Aching Pain Intervention(s): Limited activity within patient's tolerance;Monitored during session;Repositioned    Home Living   Living Arrangements: Spouse/significant other (wife works 3rd shift.  )   Type of Home: House Home Access: Stairs to enter;Ramped entrance   [employer] of Steps: front - 5-6 steps.  Home Layout: One level Home Equipment: None      Prior Function     Gait / Transfers Assistance Needed: pt reports he was independent with ambulation, but reports that he has not walked in 2 weeks.  Pt states he was able to ambulate limited community distances - used Web designer.  Pt states he could walk  for ~5 min before needing to rest.    ADL's / Homemaking Assistance Needed: independent with dressing, and bathing.  driving        Hand Dominance   Dominant Hand: Right    Extremity/Trunk Assessment   Upper Extremity Assessment Upper Extremity Assessment: Overall WFL for tasks assessed    Lower Extremity Assessment Lower Extremity Assessment: Generalized weakness        Communication      Cognition Arousal/Alertness: Awake/alert Behavior During Therapy: WFL for tasks assessed/performed Overall Cognitive Status: Within Functional Limits for tasks assessed                      General Comments      Exercises     Assessment/Plan    PT Assessment Patient needs continued PT services  PT Problem List Decreased strength;Decreased activity tolerance;Decreased balance;Decreased mobility;Decreased knowledge of use of DME;Decreased safety awareness;Pain;Decreased skin integrity          PT Treatment Interventions DME instruction;Gait training;Functional mobility training;Therapeutic activities;Therapeutic exercise;Balance training;Patient/family education    PT Goals (Current goals can be found in the Care Plan section)  Acute Rehab PT Goals Patient Stated Goal: To go home and get stronger PT Goal Formulation: With patient Time For Goal Achievement: 03/17/16 Potential to Achieve Goals: Good    Frequency Min 2X/week   Barriers to discharge        Co-evaluation               End of Session Equipment Utilized During Treatment: Gait belt Activity Tolerance: Patient limited by fatigue Patient left: with call bell/phone within reach (Sitting on the EOB) Nurse Communication: Mobility status (mobility sheet left hanging in the room.)    Functional Assessment Tool Used: [employer] "6-clicks"  Functional Limitation: Mobility: Walking and moving around Mobility: Walking and Moving Around Current Status [phone]): At least 20 percent but less than 40 percent impaired, limited or restricted Mobility: Walking and Moving Around Goal Status [phone]): At least 1 percent but less than 20 percent impaired, limited or restricted    Time: 1349-1425 PT Time Calculation (min) (ACUTE ONLY): 36 min   Charges:   PT Evaluation $PT Eval Low Complexity: 1 Procedure PT Treatments $Gait Training: 8-22 mins   PT G Codes:   PT G-Codes  **NOT FOR INPATIENT CLASS** Functional Assessment Tool Used: [employer] "6-clicks"  Functional Limitation: Mobility: Walking and moving around Mobility: Walking and Moving Around Current Status [phone]): At least 20 percent but less than 40 percent impaired, limited or restricted Mobility: Walking and Moving Around Goal Status [phone]): At least 1 percent but less than 20 percent impaired, limited or restricted    Beth Piper [place], PT, DPT X: [phone]

## 2016-03-10 NOTE — Progress Notes (Signed)
ANTICOAGULATION CONSULT NOTE   Pharmacy Consult for Heparin Indication: VTE treatment  No Known Allergies  Patient Measurements: Height: 5\' 11"  (180.3 cm) Weight: 251 lb 1.7 oz (113.9 kg) IBW/kg (Calculated) : 75.3 HEPARIN DW (KG): 101.6  Vital Signs: Temp: 98.3 F (36.8 C) (02/05 0500) Temp Source: Oral (02/05 0500) BP: 103/64 (02/05 0500) Pulse Rate: 111 (02/05 0500)  Labs:  Recent Labs  03/08/16 0632  03/09/16 0701 03/09/16 1457 03/10/16 0616  HGB 7.7*  --  6.8*  --  6.8*  HCT 23.6*  --  20.6*  --  20.5*  PLT 353  --  284  --  316  LABPROT 18.9*  --  17.8*  --  17.3*  INR 1.56  --  1.46  --  1.40  HEPARINUNFRC 0.92*  < > 0.24* 0.44 0.33  CREATININE 9.45*  --   --   --  15.70*  < > = values in this interval not displayed.  Estimated Creatinine Clearance: 7.4 mL/min (by C-G formula based on SCr of 15.7 mg/dL (H)).   Medical History: Past Medical History:  Diagnosis Date  . Anemia   . Arthritis   . BPH (benign prostatic hyperplasia)   . Chronic back pain   . Difficulty walking   . DVT (deep venous thrombosis) ([place])    Right popliteal DVT December 2017  . End-stage renal disease on hemodialysis ([place])    Dr. Lowanda Foster  . Erectile dysfunction   . Essential hypertension   . Gout   . Neuropathy, diabetic ([place])   . Type 2 diabetes mellitus ([place])   . Venous (peripheral) insufficiency     Medications:  Prescriptions Prior to Admission  Medication Sig Dispense Refill Last Dose  . clindamycin (CLEOCIN) 300 MG capsule Take 1 capsule (300 mg total) by mouth every 8 (eight) hours. 30 capsule 0 03/03/2016 at Unknown time  . colchicine 0.6 MG tablet 1/2 tab po 2 times /week (Patient taking differently: Take 0.3 mg by mouth daily as needed (for gout). ) 15 tablet 0 unknown  . gabapentin (NEURONTIN) 100 MG capsule Take 100 mg by mouth daily.    03/04/2016 at Unknown time  . Multiple Vitamin (DAILY VITE PO) Take 1 tablet by mouth daily.   unknown  .  sevelamer carbonate (RENVELA) 800 MG tablet Take 4 tablets (3,200 mg total) by mouth 3 (three) times daily with meals. 90 tablet 0 03/03/2016 at Unknown time  . sevelamer carbonate (RENVELA) 800 MG tablet Take 1 tablet (800 mg total) by mouth 2 (two) times daily between meals as needed (With snacks). 60 tablet 0 03/03/2016 at Unknown time  . sildenafil (REVATIO) 20 MG tablet Take 20 mg by mouth daily as needed. Dissolved under tongue (TROCHE) 60 minutes prior to sexual encounter   unknown  . traMADol (ULTRAM) 50 MG tablet Take 1 tablet (50 mg total) by mouth daily as needed (with dressing changes). 30 tablet 0 03/03/2016 at Unknown time  . ULORIC 80 MG TABS Take 80 mg by mouth daily.   03/03/2016 at Unknown time  . warfarin (COUMADIN) 5 MG tablet Take 2.5 mg by mouth daily. Take 1/2 tablet daily   Past Week at 1900    Assessment: 51 year old male with multiple medical problems including ESRD on HD,  recently discharge January 26 for scrotal abscess that was I/D. Presented to the ER 1/29 with an episode of significant bleeding from the wound in the context of ongoing anticoagulation for DVT.There is no ongoing bleeding. He was  transfused and given Vit. K 1mg  on 1/30. Asked to restart anticoagulation with heparin.  Heparin level this am 0.33 units/ml. F/U in regards to restarting coumadin. Hgb remains at 6.8.  Goal of Therapy:  Anti-Xa level 0.3-7  Monitor platelets by anticoagulation protocol: Yes   Plan:  Continue heparin infusion to 2050 units/hr Check anti-Xa level daily while on heparin Continue to monitor H&H and platelets  Thanks for allowing pharmacy to be a part of this patient's care.  Isac Sarna, BS [place], BCPS Clinical Pharmacist Pager [phone] 03/10/2016,8:07 AM

## 2016-03-11 LAB — TYPE AND SCREEN
ABO/RH(D): A POS
Antibody Screen: NEGATIVE
Unit division: 0

## 2016-03-11 LAB — CBC WITH DIFFERENTIAL/PLATELET
BASOS PCT: 0 %
Basophils Absolute: 0 10*3/uL (ref 0.0–0.1)
Eosinophils Absolute: 0.3 10*3/uL (ref 0.0–0.7)
Eosinophils Relative: 2 %
HEMATOCRIT: 22.6 % — AB (ref 39.0–52.0)
Hemoglobin: 7.4 g/dL — ABNORMAL LOW (ref 13.0–17.0)
LYMPHS ABS: 1.6 10*3/uL (ref 0.7–4.0)
LYMPHS PCT: 10 %
MCH: 29.6 pg (ref 26.0–34.0)
MCHC: 32.7 g/dL (ref 30.0–36.0)
MCV: 90.4 fL (ref 78.0–100.0)
MONO ABS: 1.6 10*3/uL — AB (ref 0.1–1.0)
MONOS PCT: 10 %
NEUTROS ABS: 13.1 10*3/uL — AB (ref 1.7–7.7)
Neutrophils Relative %: 78 %
Platelets: 308 10*3/uL (ref 150–400)
RBC: 2.5 MIL/uL — ABNORMAL LOW (ref 4.22–5.81)
RDW: 16.3 % — AB (ref 11.5–15.5)
WBC: 16.6 10*3/uL — ABNORMAL HIGH (ref 4.0–10.5)

## 2016-03-11 LAB — HEPARIN LEVEL (UNFRACTIONATED)
HEPARIN UNFRACTIONATED: 0.78 [IU]/mL — AB (ref 0.30–0.70)
Heparin Unfractionated: 0.28 IU/mL — ABNORMAL LOW (ref 0.30–0.70)

## 2016-03-11 LAB — PROTIME-INR
INR: 1.31
Prothrombin Time: 16.4 seconds — ABNORMAL HIGH (ref 11.4–15.2)

## 2016-03-11 LAB — GLUCOSE, CAPILLARY
GLUCOSE-CAPILLARY: 86 mg/dL (ref 65–99)
Glucose-Capillary: 117 mg/dL — ABNORMAL HIGH (ref 65–99)
Glucose-Capillary: 99 mg/dL (ref 65–99)
Glucose-Capillary: 89 mg/dL (ref 65–99)
Glucose-Capillary: 103 mg/dL — ABNORMAL HIGH (ref 65–99)

## 2016-03-11 MED ORDER — NAPHAZOLINE-GLYCERIN 0.012-0.2 % OP SOLN
2.0000 [drp] | Freq: Four times a day (QID) | OPHTHALMIC | Status: DC | PRN
  Filled 2016-03-11: qty 15

## 2016-03-11 MED ORDER — COLCHICINE 0.6 MG PO TABS
0.3000 mg | ORAL_TABLET | Freq: Every day | ORAL | Status: DC | PRN
  Administered 2016-03-11 – 2016-03-12 (×2): 0.3 mg via ORAL
  Filled 2016-03-11 (×2): qty 1

## 2016-03-11 MED ORDER — HYPROMELLOSE (GONIOSCOPIC) 2.5 % OP SOLN
1.0000 [drp] | OPHTHALMIC | Status: DC | PRN
  Administered 2016-03-12 (×2): 1 [drp] via OPHTHALMIC
  Filled 2016-03-11: qty 15

## 2016-03-11 NOTE — Progress Notes (Signed)
Subjective: Interval History: Patient complains of weakness other wise feels ok  Objective: Vital signs in last 24 hours: Temp:  [97.6 F (36.4 C)-98.8 F (37.1 C)] 97.9 F (36.6 C) (02/06 0500) Pulse Rate:  [72-115] 93 (02/06 0500) Resp:  [18-20] 18 (02/06 0500) BP: (99-144)/(53-71) 99/63 (02/06 0500) SpO2:  [99 %-100 %] 100 % (02/06 0500) Weight change:   Intake/Output from previous day: 02/05 0701 - 02/06 0700 In: 1502 [P.O.:240; I.V.:492; Blood:670; IV Piggyback:100] Out: 0086 [Urine:100] Intake/Output this shift: No intake/output data recorded.  General appearance: alert, cooperative and no distress Resp: clear to auscultation bilaterally Cardio: regular rate and rhythm Male genitalia: Patient with scrotal swelling and edema. Extremities: venous stasis dermatitis noted  Lab Results:  Recent Labs  03/10/16 0616 03/11/16 0610  WBC 22.8* 16.6*  HGB 6.8* 7.4*  HCT 20.5* 22.6*  PLT 316 308   BMET:   Recent Labs  03/10/16 0616  NA 134*  K 4.5  CL 94*  CO2 24  GLUCOSE 98  BUN 83*  CREATININE 15.70*  CALCIUM 8.7*   No results for input(s): PTH in the last 72 hours. Iron Studies: No results for input(s): IRON, TIBC, TRANSFERRIN, FERRITIN in the last 72 hours.  Studies/Results: No results found.  I have reviewed the patient's current medications.  Assessment/Plan: Problem #1 scrotal abscess: Status D&C. Patient is on antibiotics. He is afebrile and seems to improving Problem #2 end-stage renal disease: Patient Status post hemodialysis yesterday. Presently he didn't have any nausea or vomiting. His potassium is normal. Problem #3 anemia: Possibly a combination of bleeding and anemia of chronic disease. His status post blood transfusion on dialysis yesterday. His hemoglobin is better but remains low.. Problem #4 metabolic bone disease: His calcium is range but phosphorus is high . Patient is on a binder. Problem #5 fluid management: Patient denies any  difficulty breathing. Problem #6 history of cardiomyopathy Problem #7 history of obesity Plan: 1] patient doesn't require dialysis today 2] we'll make arrangement for dialysis in the morning. 3] we'll check CBC and renal panel in the morning.  LOS: 7 days   Freddie Dymek S 03/11/2016,8:31 AM

## 2016-03-11 NOTE — Progress Notes (Signed)
PROGRESS NOTE  Jack Irwin:[id number] DOB: January 05, 1966 DOA: 03/03/2016 PCP: Jack Callander, FNP  Brief Narrative: 51 year old male with multiple medical problems status post scrotal abscess incision and drainage with discharge January 26, with oral antibiotics and daily dressing changes/packing. Presented to the ER 1/29 with an episode of significant bleeding from the wound in the context of ongoing anticoagulation for DVT. Plans were made for observation, trending of hemoglobin, monitoring of INR and urology follow-up anticipated 1/30 in the morning.  Assessment/Plan 1. Bleeding from scrotal wound - improving.  Hg continuing to trend down. Hemoglobin now 6.8.  Ordered for 1 unit PRBC during HD 2/5.   INR now less than 2.  On heparin for anticoagulation. Follow INR.     2. Anemia of chronic disease renal failure. Transfused 1 unit packed red blood cells during hemodialysis 2/5.  Following CBC. Hg improved at 7.4.  3. Leukocytosis - WBC trending down with broader spectrum antibiotic coverage. Following.  No fever and blood cultures no growth to date.     4. Scrotal abscess status post incision and drainage prior to admission. Pt still has hard indurated area in groin that is unchanged from exam 3 days ago, slightly larger from last exam.  Broadened antibiotic coverage with zosyn and vanc IV with no real improvement.  Await for urology evaluation.  5. Right lower extremity DVT December 2017. Holding warfarin for now.  Started heparin drip now that INR less than 2.   6. End-stage renal disease. Management per nephrology.  HD per nephrology team  Had HD 2/5 and should have HD 2/7.  7. Diabetes mellitus type 2 with diabetic neuropathy.  BS well controlled.  8. Severe Constipation - Pt had several large BMs, continue laxatives.  9. Tachycardia - improving, sinus tach suspect secondary to infection, following. No signs of sepsis physiology at this time.  10. IV infiltration RUE - continue warm  compresses and elevation.   Improving.   DVT prophylaxis: SCDs heparin Code Status: full code Family Communication: wife Disposition Plan: home   03/11/2016, 8:08 AM  LOS: 7 days   Consultants:  Nephrology  Urology  Procedures:   I&D ?  Antimicrobials:  Clindamycin (on prior to admission)  Zosyn/vanc 2/3  Interval history/Subjective: Pt is wanting to go home.   Objective: Vitals:   03/10/16 1205 03/10/16 1425 03/10/16 2108 03/11/16 0500  BP: 124/68 112/64 119/65 99/63  Pulse: 72 (!) 115 98 93  Resp: 20 18 18 18   Temp: 98.5 F (36.9 C) 97.6 F (36.4 C) 97.9 F (36.6 C) 97.9 F (36.6 C)  TempSrc: Oral Oral Oral Oral  SpO2:  100% 99% 100%  Weight:      Height:        Intake/Output Summary (Last 24 hours) at 03/11/16 0808 Last data filed at 03/11/16 0600  Gross per 24 hour  Intake             1502 ml  Output             1565 ml  Net              -63 ml     Filed Weights   03/05/16 1000 03/07/16 1120 03/10/16 0745  Weight: 114.6 kg (252 lb 10.4 oz) 113.9 kg (251 lb 1.7 oz) 113.9 kg (251 lb 1.7 oz)    Exam:    Constitutional: Appears calm, comfortable. Cardiovascular regular rate and rhythm. No murmur, rub or gallop. Respiratory. Clear to auscultation bilaterally. No  wheezes, rales or rhonchi. Normal respiratory effort. Abdomen: soft, no masses palpated, normal BS.  Scrotum continues to be swollen with hard indurated area in pubic area but no active bleeding seen. Ext: swollen RUE from IV infiltration improved from prior exam.  Results for orders placed or performed during the hospital encounter of 03/03/16  Culture, blood (Routine X 2) w Reflex to ID Panel  Result Value Ref Range   Specimen Description BLOOD BLOOD RIGHT HAND    Special Requests BOTTLES DRAWN AEROBIC AND ANAEROBIC 5CC    Culture NO GROWTH 2 DAYS    Report Status PENDING   Culture, blood (Routine X 2) w Reflex to ID Panel  Result Value Ref Range   Specimen Description BLOOD BLOOD  RIGHT HAND    Special Requests BOTTLES DRAWN AEROBIC AND ANAEROBIC 6CC    Culture NO GROWTH 2 DAYS    Report Status PENDING   CBC  Result Value Ref Range   WBC 12.5 (H) 4.0 - 10.5 K/uL   RBC 3.32 (L) 4.22 - 5.81 MIL/uL   Hemoglobin 9.6 (L) 13.0 - 17.0 g/dL   HCT 29.2 (L) 39.0 - 52.0 %   MCV 88.0 78.0 - 100.0 fL   MCH 28.9 26.0 - 34.0 pg   MCHC 32.9 30.0 - 36.0 g/dL   RDW 16.2 (H) 11.5 - 15.5 %   Platelets 270 150 - 400 K/uL  Protime-INR  Result Value Ref Range   Prothrombin Time 35.7 (H) 11.4 - 15.2 seconds   INR 1.63   Basic metabolic panel  Result Value Ref Range   Sodium 133 (L) 135 - 145 mmol/L   Potassium 4.9 3.5 - 5.1 mmol/L   Chloride 93 (L) 101 - 111 mmol/L   CO2 22 22 - 32 mmol/L   Glucose, Bld 77 65 - 99 mg/dL   BUN 98 (H) 6 - 20 mg/dL   Creatinine, Ser 15.50 (H) 0.61 - 1.24 mg/dL   Calcium 9.0 8.9 - 10.3 mg/dL   GFR calc non Af Amer 3 (L) >60 mL/min   GFR calc Af Amer 4 (L) >60 mL/min   Anion gap 18 (H) 5 - 15  Glucose, capillary  Result Value Ref Range   Glucose-Capillary 68 65 - 99 mg/dL   Comment 1 Notify RN    Comment 2 Document in Chart   Basic metabolic panel  Result Value Ref Range   Sodium 134 (L) 135 - 145 mmol/L   Potassium 5.4 (H) 3.5 - 5.1 mmol/L   Chloride 94 (L) 101 - 111 mmol/L   CO2 22 22 - 32 mmol/L   Glucose, Bld 92 65 - 99 mg/dL   BUN 112 (H) 6 - 20 mg/dL   Creatinine, Ser 17.23 (H) 0.61 - 1.24 mg/dL   Calcium 8.8 (L) 8.9 - 10.3 mg/dL   GFR calc non Af Amer 3 (L) >60 mL/min   GFR calc Af Amer 3 (L) >60 mL/min   Anion gap 18 (H) 5 - 15  CBC  Result Value Ref Range   WBC 12.2 (H) 4.0 - 10.5 K/uL   RBC 2.67 (L) 4.22 - 5.81 MIL/uL   Hemoglobin 7.6 (L) 13.0 - 17.0 g/dL   HCT 23.5 (L) 39.0 - 52.0 %   MCV 88.0 78.0 - 100.0 fL   MCH 28.5 26.0 - 34.0 pg   MCHC 32.3 30.0 - 36.0 g/dL   RDW 15.5 11.5 - 15.5 %   Platelets 266 150 - 400 K/uL  Protime-INR  Result  Value Ref Range   Prothrombin Time 37.4 (H) 11.4 - 15.2 seconds   INR 3.68     Glucose, capillary  Result Value Ref Range   Glucose-Capillary 114 (H) 65 - 99 mg/dL   Comment 1 Notify RN    Comment 2 Document in Chart   Glucose, capillary  Result Value Ref Range   Glucose-Capillary 97 65 - 99 mg/dL   Comment 1 Notify RN    Comment 2 Document in Chart   CBC  Result Value Ref Range   WBC 13.4 (H) 4.0 - 10.5 K/uL   RBC 2.63 (L) 4.22 - 5.81 MIL/uL   Hemoglobin 7.8 (L) 13.0 - 17.0 g/dL   HCT 22.8 (L) 39.0 - 52.0 %   MCV 86.7 78.0 - 100.0 fL   MCH 29.7 26.0 - 34.0 pg   MCHC 34.2 30.0 - 36.0 g/dL   RDW 16.4 (H) 11.5 - 15.5 %   Platelets 258 150 - 400 K/uL  Glucose, capillary  Result Value Ref Range   Glucose-Capillary 89 65 - 99 mg/dL   Comment 1 Notify RN    Comment 2 Document in Chart   CBC  Result Value Ref Range   WBC 15.0 (H) 4.0 - 10.5 K/uL   RBC 2.65 (L) 4.22 - 5.81 MIL/uL   Hemoglobin 7.8 (L) 13.0 - 17.0 g/dL   HCT 23.0 (L) 39.0 - 52.0 %   MCV 86.8 78.0 - 100.0 fL   MCH 29.4 26.0 - 34.0 pg   MCHC 33.9 30.0 - 36.0 g/dL   RDW 16.3 (H) 11.5 - 15.5 %   Platelets 294 150 - 400 K/uL  Glucose, capillary  Result Value Ref Range   Glucose-Capillary 85 65 - 99 mg/dL   Comment 1 Notify RN    Comment 2 Document in Chart   Glucose, capillary  Result Value Ref Range   Glucose-Capillary 91 65 - 99 mg/dL   Comment 1 Notify RN    Comment 2 Document in Chart   CBC  Result Value Ref Range   WBC 14.2 (H) 4.0 - 10.5 K/uL   RBC 2.56 (L) 4.22 - 5.81 MIL/uL   Hemoglobin 7.6 (L) 13.0 - 17.0 g/dL   HCT 22.5 (L) 39.0 - 52.0 %   MCV 87.9 78.0 - 100.0 fL   MCH 29.7 26.0 - 34.0 pg   MCHC 33.8 30.0 - 36.0 g/dL   RDW 16.4 (H) 11.5 - 15.5 %   Platelets 275 150 - 400 K/uL  Renal function panel  Result Value Ref Range   Sodium 133 (L) 135 - 145 mmol/L   Potassium 4.7 3.5 - 5.1 mmol/L   Chloride 95 (L) 101 - 111 mmol/L   CO2 27 22 - 32 mmol/L   Glucose, Bld 132 (H) 65 - 99 mg/dL   BUN 50 (H) 6 - 20 mg/dL   Creatinine, Ser 10.16 (H) 0.61 - 1.24 mg/dL   Calcium  8.8 (L) 8.9 - 10.3 mg/dL   Phosphorus 6.7 (H) 2.5 - 4.6 mg/dL   Albumin 2.6 (L) 3.5 - 5.0 g/dL   GFR calc non Af Amer 5 (L) >60 mL/min   GFR calc Af Amer 6 (L) >60 mL/min   Anion gap 11 5 - 15  Protime-INR  Result Value Ref Range   Prothrombin Time 24.6 (H) 11.4 - 15.2 seconds   INR 2.18   Glucose, capillary  Result Value Ref Range   Glucose-Capillary 86 65 - 99 mg/dL   Comment 1 Notify  RN    Comment 2 Document in Chart   Glucose, capillary  Result Value Ref Range   Glucose-Capillary 103 (H) 65 - 99 mg/dL  Glucose, capillary  Result Value Ref Range   Glucose-Capillary 107 (H) 65 - 99 mg/dL  CBC  Result Value Ref Range   WBC 18.5 (H) 4.0 - 10.5 K/uL   RBC 2.50 (L) 4.22 - 5.81 MIL/uL   Hemoglobin 7.3 (L) 13.0 - 17.0 g/dL   HCT 22.4 (L) 39.0 - 52.0 %   MCV 89.6 78.0 - 100.0 fL   MCH 29.2 26.0 - 34.0 pg   MCHC 32.6 30.0 - 36.0 g/dL   RDW 16.1 (H) 11.5 - 15.5 %   Platelets 312 150 - 400 K/uL  Renal function panel  Result Value Ref Range   Sodium 134 (L) 135 - 145 mmol/L   Potassium 4.5 3.5 - 5.1 mmol/L   Chloride 96 (L) 101 - 111 mmol/L   CO2 28 22 - 32 mmol/L   Glucose, Bld 91 65 - 99 mg/dL   BUN 42 (H) 6 - 20 mg/dL   Creatinine, Ser 9.08 (H) 0.61 - 1.24 mg/dL   Calcium 9.3 8.9 - 10.3 mg/dL   Phosphorus 7.3 (H) 2.5 - 4.6 mg/dL   Albumin 2.8 (L) 3.5 - 5.0 g/dL   GFR calc non Af Amer 6 (L) >60 mL/min   GFR calc Af Amer 7 (L) >60 mL/min   Anion gap 10 5 - 15  Protime-INR  Result Value Ref Range   Prothrombin Time 17.5 (H) 11.4 - 15.2 seconds   INR 1.42   Glucose, capillary  Result Value Ref Range   Glucose-Capillary 110 (H) 65 - 99 mg/dL  Glucose, capillary  Result Value Ref Range   Glucose-Capillary 90 65 - 99 mg/dL   Comment 1 Notify RN    Comment 2 Document in Chart   Glucose, capillary  Result Value Ref Range   Glucose-Capillary 85 65 - 99 mg/dL   Comment 1 Notify RN    Comment 2 Document in Chart   Glucose, capillary  Result Value Ref Range    Glucose-Capillary 90 65 - 99 mg/dL  Glucose, capillary  Result Value Ref Range   Glucose-Capillary 90 65 - 99 mg/dL  Heparin level (unfractionated)  Result Value Ref Range   Heparin Unfractionated <0.10 (L) 0.30 - 0.70 IU/mL  Glucose, capillary  Result Value Ref Range   Glucose-Capillary 106 (H) 65 - 99 mg/dL  CBC  Result Value Ref Range   WBC 21.3 (H) 4.0 - 10.5 K/uL   RBC 2.47 (L) 4.22 - 5.81 MIL/uL   Hemoglobin 7.2 (L) 13.0 - 17.0 g/dL   HCT 22.2 (L) 39.0 - 52.0 %   MCV 89.9 78.0 - 100.0 fL   MCH 29.1 26.0 - 34.0 pg   MCHC 32.4 30.0 - 36.0 g/dL   RDW 16.3 (H) 11.5 - 15.5 %   Platelets 363 150 - 400 K/uL  Renal function panel  Result Value Ref Range   Sodium 134 (L) 135 - 145 mmol/L   Potassium 4.6 3.5 - 5.1 mmol/L   Chloride 94 (L) 101 - 111 mmol/L   CO2 25 22 - 32 mmol/L   Glucose, Bld 96 65 - 99 mg/dL   BUN 64 (H) 6 - 20 mg/dL   Creatinine, Ser 12.39 (H) 0.61 - 1.24 mg/dL   Calcium 9.3 8.9 - 10.3 mg/dL   Phosphorus 9.4 (H) 2.5 - 4.6 mg/dL   Albumin 3.0 (  L) 3.5 - 5.0 g/dL   GFR calc non Af Amer 4 (L) >60 mL/min   GFR calc Af Amer 5 (L) >60 mL/min   Anion gap 15 5 - 15  Protime-INR  Result Value Ref Range   Prothrombin Time 18.0 (H) 11.4 - 15.2 seconds   INR 1.47   Heparin level (unfractionated)  Result Value Ref Range   Heparin Unfractionated <0.10 (L) 0.30 - 0.70 IU/mL  Glucose, capillary  Result Value Ref Range   Glucose-Capillary 107 (H) 65 - 99 mg/dL   Comment 1 Notify RN    Comment 2 Document in Chart   Heparin level (unfractionated)  Result Value Ref Range   Heparin Unfractionated 0.85 (H) 0.30 - 0.70 IU/mL  Glucose, capillary  Result Value Ref Range   Glucose-Capillary 72 65 - 99 mg/dL  Glucose, capillary  Result Value Ref Range   Glucose-Capillary 119 (H) 65 - 99 mg/dL  CBC  Result Value Ref Range   WBC 31.8 (H) 4.0 - 10.5 K/uL   RBC 2.63 (L) 4.22 - 5.81 MIL/uL   Hemoglobin 7.7 (L) 13.0 - 17.0 g/dL   HCT 23.6 (L) 39.0 - 52.0 %   MCV 89.7 78.0  - 100.0 fL   MCH 29.3 26.0 - 34.0 pg   MCHC 32.6 30.0 - 36.0 g/dL   RDW 16.4 (H) 11.5 - 15.5 %   Platelets 353 150 - 400 K/uL  Renal function panel  Result Value Ref Range   Sodium 134 (L) 135 - 145 mmol/L   Potassium 4.1 3.5 - 5.1 mmol/L   Chloride 96 (L) 101 - 111 mmol/L   CO2 28 22 - 32 mmol/L   Glucose, Bld 91 65 - 99 mg/dL   BUN 37 (H) 6 - 20 mg/dL   Creatinine, Ser 9.45 (H) 0.61 - 1.24 mg/dL   Calcium 8.8 (L) 8.9 - 10.3 mg/dL   Phosphorus 7.0 (H) 2.5 - 4.6 mg/dL   Albumin 2.7 (L) 3.5 - 5.0 g/dL   GFR calc non Af Amer 6 (L) >60 mL/min   GFR calc Af Amer 7 (L) >60 mL/min   Anion gap 10 5 - 15  Protime-INR  Result Value Ref Range   Prothrombin Time 18.9 (H) 11.4 - 15.2 seconds   INR 1.56   Heparin level (unfractionated)  Result Value Ref Range   Heparin Unfractionated 0.92 (H) 0.30 - 0.70 IU/mL  Glucose, capillary  Result Value Ref Range   Glucose-Capillary 104 (H) 65 - 99 mg/dL  Glucose, capillary  Result Value Ref Range   Glucose-Capillary 117 (H) 65 - 99 mg/dL   Comment 1 Notify RN    Comment 2 Document in Chart   Glucose, capillary  Result Value Ref Range   Glucose-Capillary 101 (H) 65 - 99 mg/dL   Comment 1 Notify RN    Comment 2 Document in Chart   Heparin level (unfractionated)  Result Value Ref Range   Heparin Unfractionated 0.34 0.30 - 0.70 IU/mL  Glucose, capillary  Result Value Ref Range   Glucose-Capillary 110 (H) 65 - 99 mg/dL   Comment 1 Notify RN    Comment 2 Document in Chart   Glucose, capillary  Result Value Ref Range   Glucose-Capillary 97 65 - 99 mg/dL   Comment 1 Notify RN    Comment 2 Document in Chart   Protime-INR  Result Value Ref Range   Prothrombin Time 17.8 (H) 11.4 - 15.2 seconds   INR 1.46   Heparin level (unfractionated)  Result Value Ref Range   Heparin Unfractionated 0.24 (L) 0.30 - 0.70 IU/mL  CBC with Differential/Platelet  Result Value Ref Range   WBC 24.6 (H) 4.0 - 10.5 K/uL   RBC 2.30 (L) 4.22 - 5.81 MIL/uL    Hemoglobin 6.8 (LL) 13.0 - 17.0 g/dL   HCT 20.6 (L) 39.0 - 52.0 %   MCV 89.6 78.0 - 100.0 fL   MCH 29.6 26.0 - 34.0 pg   MCHC 33.0 30.0 - 36.0 g/dL   RDW 16.5 (H) 11.5 - 15.5 %   Platelets 284 150 - 400 K/uL   Neutrophils Relative % 84 %   Neutro Abs 20.7 (H) 1.7 - 7.7 K/uL   Lymphocytes Relative 9 %   Lymphs Abs 2.2 0.7 - 4.0 K/uL   Monocytes Relative 6 %   Monocytes Absolute 1.5 (H) 0.1 - 1.0 K/uL   Eosinophils Relative 1 %   Eosinophils Absolute 0.2 0.0 - 0.7 K/uL   Basophils Relative 0 %   Basophils Absolute 0.0 0.0 - 0.1 K/uL  Glucose, capillary  Result Value Ref Range   Glucose-Capillary 109 (H) 65 - 99 mg/dL  Glucose, capillary  Result Value Ref Range   Glucose-Capillary 96 65 - 99 mg/dL  Glucose, capillary  Result Value Ref Range   Glucose-Capillary 86 65 - 99 mg/dL  Glucose, capillary  Result Value Ref Range   Glucose-Capillary 99 65 - 99 mg/dL  Heparin level (unfractionated)  Result Value Ref Range   Heparin Unfractionated 0.44 0.30 - 0.70 IU/mL  Glucose, capillary  Result Value Ref Range   Glucose-Capillary 95 65 - 99 mg/dL   Comment 1 Notify RN    Comment 2 Document in Chart   Glucose, capillary  Result Value Ref Range   Glucose-Capillary 71 65 - 99 mg/dL  Protime-INR  Result Value Ref Range   Prothrombin Time 17.3 (H) 11.4 - 15.2 seconds   INR 1.40   Heparin level (unfractionated)  Result Value Ref Range   Heparin Unfractionated 0.33 0.30 - 0.70 IU/mL  CBC with Differential/Platelet  Result Value Ref Range   WBC 22.8 (H) 4.0 - 10.5 K/uL   RBC 2.29 (L) 4.22 - 5.81 MIL/uL   Hemoglobin 6.8 (LL) 13.0 - 17.0 g/dL   HCT 20.5 (L) 39.0 - 52.0 %   MCV 89.5 78.0 - 100.0 fL   MCH 29.7 26.0 - 34.0 pg   MCHC 33.2 30.0 - 36.0 g/dL   RDW 16.5 (H) 11.5 - 15.5 %   Platelets 316 150 - 400 K/uL   Neutrophils Relative % 86 %   Neutro Abs 19.6 (H) 1.7 - 7.7 K/uL   Lymphocytes Relative 8 %   Lymphs Abs 1.7 0.7 - 4.0 K/uL   Monocytes Relative 5 %   Monocytes  Absolute 1.2 (H) 0.1 - 1.0 K/uL   Eosinophils Relative 1 %   Eosinophils Absolute 0.3 0.0 - 0.7 K/uL   Basophils Relative 0 %   Basophils Absolute 0.0 0.0 - 0.1 K/uL  Renal function panel  Result Value Ref Range   Sodium 134 (L) 135 - 145 mmol/L   Potassium 4.5 3.5 - 5.1 mmol/L   Chloride 94 (L) 101 - 111 mmol/L   CO2 24 22 - 32 mmol/L   Glucose, Bld 98 65 - 99 mg/dL   BUN 83 (H) 6 - 20 mg/dL   Creatinine, Ser 15.70 (H) 0.61 - 1.24 mg/dL   Calcium 8.7 (L) 8.9 - 10.3 mg/dL   Phosphorus 8.8 (H) 2.5 -  4.6 mg/dL   Albumin 2.3 (L) 3.5 - 5.0 g/dL   GFR calc non Af Amer 3 (L) >60 mL/min   GFR calc Af Amer 4 (L) >60 mL/min   Anion gap 16 (H) 5 - 15  Glucose, capillary  Result Value Ref Range   Glucose-Capillary 108 (H) 65 - 99 mg/dL   Comment 1 Notify RN    Comment 2 Document in Chart   Glucose, capillary  Result Value Ref Range   Glucose-Capillary 102 (H) 65 - 99 mg/dL   Comment 1 Notify RN    Comment 2 Document in Chart   Glucose, capillary  Result Value Ref Range   Glucose-Capillary 103 (H) 65 - 99 mg/dL  Glucose, capillary  Result Value Ref Range   Glucose-Capillary 127 (H) 65 - 99 mg/dL  Glucose, capillary  Result Value Ref Range   Glucose-Capillary 110 (H) 65 - 99 mg/dL  Protime-INR  Result Value Ref Range   Prothrombin Time 16.4 (H) 11.4 - 15.2 seconds   INR 1.31   Heparin level (unfractionated)  Result Value Ref Range   Heparin Unfractionated 0.28 (L) 0.30 - 0.70 IU/mL  CBC with Differential/Platelet  Result Value Ref Range   WBC 16.6 (H) 4.0 - 10.5 K/uL   RBC 2.50 (L) 4.22 - 5.81 MIL/uL   Hemoglobin 7.4 (L) 13.0 - 17.0 g/dL   HCT 22.6 (L) 39.0 - 52.0 %   MCV 90.4 78.0 - 100.0 fL   MCH 29.6 26.0 - 34.0 pg   MCHC 32.7 30.0 - 36.0 g/dL   RDW 16.3 (H) 11.5 - 15.5 %   Platelets 308 150 - 400 K/uL   Neutrophils Relative % 78 %   Neutro Abs 13.1 (H) 1.7 - 7.7 K/uL   Lymphocytes Relative 10 %   Lymphs Abs 1.6 0.7 - 4.0 K/uL   Monocytes Relative 10 %   Monocytes  Absolute 1.6 (H) 0.1 - 1.0 K/uL   Eosinophils Relative 2 %   Eosinophils Absolute 0.3 0.0 - 0.7 K/uL   Basophils Relative 0 %   Basophils Absolute 0.0 0.0 - 0.1 K/uL  Glucose, capillary  Result Value Ref Range   Glucose-Capillary 99 65 - 99 mg/dL   Comment 1 Notify RN    Comment 2 Document in Chart   Glucose, capillary  Result Value Ref Range   Glucose-Capillary 117 (H) 65 - 99 mg/dL  Glucose, capillary  Result Value Ref Range   Glucose-Capillary 99 65 - 99 mg/dL  Type and screen [facility]  Result Value Ref Range   ABO/RH(D) A POS    Antibody Screen NEG    Sample Expiration 03/07/2016    Unit Number [id number]    Blood Component Type RED CELLS,LR    Unit division 00    Status of Unit ISSUED,FINAL    Transfusion Status OK TO TRANSFUSE    Crossmatch Result Compatible    Unit Number [id number]    Blood Component Type RED CELLS,LR    Unit division 00    Status of Unit REL FROM [facility]    Transfusion Status OK TO TRANSFUSE    Crossmatch Result Compatible    Unit Number [id number]    Blood Component Type RED CELLS,LR    Unit division 00    Status of Unit REL FROM [facility]    Transfusion Status OK TO TRANSFUSE    Crossmatch Result Compatible    Unit Number [id number]    Blood Component Type RED CELLS,LR    Unit  division 00    Status of Unit REL FROM [facility]    Transfusion Status OK TO TRANSFUSE    Crossmatch Result Compatible   Prepare RBC  Result Value Ref Range   Order Confirmation ORDER PROCESSED BY BLOOD BANK   Type and screen [facility]  Result Value Ref Range   ABO/RH(D) A POS    Antibody Screen NEG    Sample Expiration 03/12/2016    Unit Number [id number]    Blood Component Type RED CELLS,LR    Unit division 00    Status of Unit ISSUED,FINAL    Transfusion Status OK TO TRANSFUSE    Crossmatch Result Compatible   Prepare RBC  Result Value Ref Range   Order Confirmation ORDER PROCESSED BY BLOOD BANK    Scheduled Meds: . sodium  chloride   Intravenous Once  . sodium chloride   Intravenous Once  . epoetin (EPOGEN/PROCRIT) injection  10,000 Units Intravenous Q M,W,F-HD  . febuxostat  80 mg Oral Daily  . gabapentin  100 mg Oral Daily  . insulin aspart  0-15 Units Subcutaneous TID WC  . piperacillin-tazobactam (ZOSYN)  IV  3.375 g Intravenous Q12H  . senna-docusate  2 tablet Oral BID  . sevelamer carbonate  3,200 mg Oral TID WC  . vancomycin  1,000 mg Intravenous Q M,W,F-HD   Continuous Infusions: . heparin 2,050 Units/hr (03/11/16 0600)   Principal Problem:   Bleeding from wound Active Problems:   Weakness of both legs   Arthritis, gouty   Hypertension   ESRD on dialysis [facility])   RBBB   Scrotal abscess   Type 2 diabetes mellitus (HCC)   Chronic anticoagulation   Constipation   Anemia in chronic kidney disease (CKD)   LOS: 7 days   Jack Irwin 03/11/2016 8:08 AM

## 2016-03-11 NOTE — Progress Notes (Signed)
ANTICOAGULATION CONSULT NOTE :  Follow up  Pharmacy Consult for Heparin Indication: VTE treatment  No Known Allergies  Patient Measurements: Height: 5\' 11"  (180.3 cm) Weight: 251 lb 1.7 oz (113.9 kg) IBW/kg (Calculated) : 75.3 HEPARIN DW (KG): 101.6  Vital Signs: Temp: 98 F (36.7 C) (02/06 1429) Temp Source: Oral (02/06 1429) BP: 105/74 (02/06 1429) Pulse Rate: 94 (02/06 1429)  Labs:  Recent Labs  03/09/16 0701  03/10/16 0616 03/11/16 0610 03/11/16 1257  HGB 6.8*  --  6.8* 7.4*  --   HCT 20.6*  --  20.5* 22.6*  --   PLT 284  --  316 308  --   LABPROT 17.8*  --  17.3* 16.4*  --   INR 1.46  --  1.40 1.31  --   HEPARINUNFRC 0.24*  < > 0.33 0.28* 0.78*  CREATININE  --   --  15.70*  --   --   < > = values in this interval not displayed.  Estimated Creatinine Clearance: 7.4 mL/min (by C-G formula based on SCr of 15.7 mg/dL (H)).  Medical History: Past Medical History:  Diagnosis Date  . Anemia   . Arthritis   . BPH (benign prostatic hyperplasia)   . Chronic back pain   . Difficulty walking   . DVT (deep venous thrombosis) ([place])    Right popliteal DVT December 2017  . End-stage renal disease on hemodialysis ([place])    Dr. Lowanda Foster  . Erectile dysfunction   . Essential hypertension   . Gout   . Neuropathy, diabetic ([place])   . Type 2 diabetes mellitus ([place])   . Venous (peripheral) insufficiency    Medications:  Prescriptions Prior to Admission  Medication Sig Dispense Refill Last Dose  . clindamycin (CLEOCIN) 300 MG capsule Take 1 capsule (300 mg total) by mouth every 8 (eight) hours. 30 capsule 0 03/03/2016 at Unknown time  . colchicine 0.6 MG tablet 1/2 tab po 2 times /week (Patient taking differently: Take 0.3 mg by mouth daily as needed (for gout). ) 15 tablet 0 unknown  . gabapentin (NEURONTIN) 100 MG capsule Take 100 mg by mouth daily.    03/04/2016 at Unknown time  . Multiple Vitamin (DAILY VITE PO) Take 1 tablet by mouth daily.   unknown  . sevelamer  carbonate (RENVELA) 800 MG tablet Take 4 tablets (3,200 mg total) by mouth 3 (three) times daily with meals. 90 tablet 0 03/03/2016 at Unknown time  . sevelamer carbonate (RENVELA) 800 MG tablet Take 1 tablet (800 mg total) by mouth 2 (two) times daily between meals as needed (With snacks). 60 tablet 0 03/03/2016 at Unknown time  . sildenafil (REVATIO) 20 MG tablet Take 20 mg by mouth daily as needed. Dissolved under tongue (TROCHE) 60 minutes prior to sexual encounter   unknown  . traMADol (ULTRAM) 50 MG tablet Take 1 tablet (50 mg total) by mouth daily as needed (with dressing changes). 30 tablet 0 03/03/2016 at Unknown time  . ULORIC 80 MG TABS Take 80 mg by mouth daily.   03/03/2016 at Unknown time  . warfarin (COUMADIN) 5 MG tablet Take 2.5 mg by mouth daily. Take 1/2 tablet daily   Past Week at 1900   Assessment: 51 year old male with multiple medical problems including ESRD on HD,  recently discharge January 26 for scrotal abscess that was I/D. Presented to the ER 1/29 with an episode of significant bleeding from the wound in the context of ongoing anticoagulation for DVT.There is no ongoing bleeding.  He was transfused and given Vit. K 1mg  on 1/30. Asked to restart anticoagulation with heparin.  Heparin level this am 0.28 units/ml >> Heparin was increased to 2100 units/hr then heparin level trended too high to 0.78.  Heparin reduced back to 2050 units/hr.  Hgb 7.4  Heparin rate had previously been stable on 2050 units/hr.    Goal of Therapy:  Anti-Xa level 0.3-7  Monitor platelets by anticoagulation protocol: Yes   Plan:  heparin infusion  2050 units/hr Check anti-Xa level daily while on heparin Continue to monitor H&H and platelets  Thanks for allowing pharmacy to be a part of this patient's care.  Hart Robinsons, PharmD Clinical Pharmacist Pager:  [phone] 03/11/2016   03/11/2016,2:54 PM

## 2016-03-12 DIAGNOSIS — D631 Anemia in chronic kidney disease: Secondary | ICD-10-CM

## 2016-03-12 DIAGNOSIS — N492 Inflammatory disorders of scrotum: Secondary | ICD-10-CM

## 2016-03-12 DIAGNOSIS — T148XXA Other injury of unspecified body region, initial encounter: Secondary | ICD-10-CM

## 2016-03-12 DIAGNOSIS — Z992 Dependence on renal dialysis: Secondary | ICD-10-CM

## 2016-03-12 DIAGNOSIS — N186 End stage renal disease: Secondary | ICD-10-CM

## 2016-03-12 LAB — CBC
HCT: 24.3 % — ABNORMAL LOW (ref 39.0–52.0)
HEMOGLOBIN: 7.9 g/dL — AB (ref 13.0–17.0)
MCH: 29.4 pg (ref 26.0–34.0)
MCHC: 32.5 g/dL (ref 30.0–36.0)
MCV: 90.3 fL (ref 78.0–100.0)
PLATELETS: 309 10*3/uL (ref 150–400)
RBC: 2.69 MIL/uL — AB (ref 4.22–5.81)
RDW: 16 % — ABNORMAL HIGH (ref 11.5–15.5)
WBC: 13.4 10*3/uL — AB (ref 4.0–10.5)

## 2016-03-12 LAB — GLUCOSE, CAPILLARY
GLUCOSE-CAPILLARY: 74 mg/dL (ref 65–99)
GLUCOSE-CAPILLARY: 81 mg/dL (ref 65–99)
Glucose-Capillary: 73 mg/dL (ref 65–99)
Glucose-Capillary: 105 mg/dL — ABNORMAL HIGH (ref 65–99)

## 2016-03-12 LAB — RENAL FUNCTION PANEL
ANION GAP: 15 (ref 5–15)
Albumin: 2.4 g/dL — ABNORMAL LOW (ref 3.5–5.0)
BUN: 56 mg/dL — ABNORMAL HIGH (ref 6–20)
CALCIUM: 8.9 mg/dL (ref 8.9–10.3)
CHLORIDE: 93 mmol/L — AB (ref 101–111)
CO2: 25 mmol/L (ref 22–32)
CREATININE: 12.52 mg/dL — AB (ref 0.61–1.24)
GFR, EST AFRICAN AMERICAN: 5 mL/min — AB (ref >60)
GFR, EST NON AFRICAN AMERICAN: 4 mL/min — AB (ref >60)
Glucose, Bld: 104 mg/dL — ABNORMAL HIGH (ref 65–99)
Phosphorus: 10.1 mg/dL — ABNORMAL HIGH (ref 2.5–4.6)
Potassium: 3.9 mmol/L (ref 3.5–5.1)
Sodium: 133 mmol/L — ABNORMAL LOW (ref 135–145)

## 2016-03-12 LAB — PROTIME-INR
INR: 1.35
Prothrombin Time: 16.8 seconds — ABNORMAL HIGH (ref 11.4–15.2)

## 2016-03-12 LAB — HEPARIN LEVEL (UNFRACTIONATED): HEPARIN UNFRACTIONATED: 0.35 [IU]/mL (ref 0.30–0.70)

## 2016-03-12 MED ORDER — EPOETIN ALFA 10000 UNIT/ML IJ SOLN
INTRAMUSCULAR | Status: AC
  Administered 2016-03-12: 10000 [IU] via INTRAVENOUS
  Filled 2016-03-12: qty 1

## 2016-03-12 MED ORDER — SODIUM CHLORIDE 0.9 % IV SOLN: 100.0000 mL | INTRAVENOUS | Status: DC | PRN

## 2016-03-12 MED ORDER — DOXYCYCLINE HYCLATE 100 MG PO TABS
100.0000 mg | ORAL_TABLET | Freq: Two times a day (BID) | ORAL | Status: DC
  Administered 2016-03-12 – 2016-03-13 (×2): 100 mg via ORAL
  Filled 2016-03-12 (×2): qty 1

## 2016-03-12 MED ORDER — HEPARIN SODIUM (PORCINE) 1000 UNIT/ML DIALYSIS
1000.0000 [IU] | INTRAMUSCULAR | Status: DC | PRN
  Filled 2016-03-12: qty 1

## 2016-03-12 MED ORDER — HEPARIN SODIUM (PORCINE) 1000 UNIT/ML IJ SOLN
INTRAMUSCULAR | Status: AC
  Administered 2016-03-12: 1000 [IU]
  Filled 2016-03-12: qty 1

## 2016-03-12 NOTE — Progress Notes (Signed)
Jack Irwin  MRN: [id number]  DOB/AGE: 1965-03-04 51 y.o.  Primary Care Physician:Jack Irwin  Admit date: 03/03/2016  Chief Complaint:  Chief Complaint  Patient presents with  . Groin Pain  . Wound Check    Irwin-Pt presented on  03/03/2016 with  Chief Complaint  Patient presents with  . Groin Pain  . Wound Check  .    Pt today feels better..  Meds . epoetin (EPOGEN/PROCRIT) injection  10,000 Units Intravenous Q M,W,F-HD  . febuxostat  80 mg Oral Daily  . gabapentin  100 mg Oral Daily  . insulin aspart  0-15 Units Subcutaneous TID WC  . piperacillin-tazobactam (ZOSYN)  IV  3.375 g Intravenous Q12H  . senna-docusate  2 tablet Oral BID  . sevelamer carbonate  3,200 mg Oral TID WC  . vancomycin  1,000 mg Intravenous Q M,W,F-HD      Physical Exam: Vital signs in last 24 hours: Temp:  [98 F (36.7 C)] 98 F (36.7 C) (02/06 2122) Pulse Rate:  [75-94] 75 (02/07 0600) Resp:  [18-20] 19 (02/07 0600) BP: (105-116)/(64-76) 110/73 (02/07 0600) SpO2:  [96 %-98 %] 97 % (02/07 0600) Weight change:  Last BM Date: 03/06/16  Intake/Output from previous day: 02/06 0701 - 02/07 0700 In: 1275.3 [P.O.:480; I.V.:495.3; IV Piggyback:300] Out: -  No intake/output data recorded.   Physical Exam: General- pt is awake,alert, oriented to time place and person Resp- No acute REsp distress, CTA B/L NO Rhonchi CVS- S1S2 regular in rate and rhythm GIT- BS+, soft, NT, ND EXT- NO LE Edema, Cyanosis Access- PC in situ   Lab Results: CBC  Recent Labs  03/11/16 0610 03/12/16 0531  WBC 16.6* 13.4*  HGB 7.4* 7.9*  HCT 22.6* 24.3*  PLT 308 309    BMET  Recent Labs  03/10/16 0616 03/12/16 0534  NA 134* 133*  K 4.5 3.9  CL 94* 93*  CO2 24 25  GLUCOSE 98 104*  BUN 83* 56*  CREATININE 15.70* 12.52*  CALCIUM 8.7* 8.9    MICRO Recent Results (from the past 240 hour(Irwin))  Culture, blood (Routine X 2) w Reflex to ID Panel     Status: None (Preliminary result)    Collection Time: 03/08/16 10:43 AM  Result Value Ref Range Status   Specimen Description BLOOD BLOOD RIGHT HAND  Final   Special Requests BOTTLES DRAWN AEROBIC AND ANAEROBIC 5CC  Final   Culture NO GROWTH 4 DAYS  Final   Report Status PENDING  Incomplete  Culture, blood (Routine X 2) w Reflex to ID Panel     Status: None (Preliminary result)   Collection Time: 03/08/16  4:26 PM  Result Value Ref Range Status   Specimen Description BLOOD BLOOD RIGHT HAND  Final   Special Requests BOTTLES DRAWN AEROBIC AND ANAEROBIC 6CC  Final   Culture NO GROWTH 4 DAYS  Final   Report Status PENDING  Incomplete      Lab Results  Component Value Date   PTH <2.5 Result repeated and verified. (L) 09/12/2009   CALCIUM 8.9 03/12/2016   CAION 1.10 (L) 11/01/2015   PHOS 10.1 (H) 03/12/2016         Impression: 1)Renal  ESRD               Initiated on HD since 02/16/14 .               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   5)DM-Primary MD following  6)Electrolytes Hyperkalemic   Sec to ESRD Hyponatremic     Sec to ESRD  7)Acid base Co2 at goal     Plan:  Will dialyze today Will use 2 k bath     Jack Irwin 03/12/2016, 10:22 AM

## 2016-03-12 NOTE — Progress Notes (Signed)
PROGRESS NOTE  Jack Irwin:[id number] DOB: [birth year] DOA: 03/03/2016 PCP: Kingsley Callander, FNP  Brief Narrative: 51 year old male with multiple medical problems status post scrotal abscess incision and drainage with discharge January 26, with oral antibiotics and daily dressing changes/packing. Presented to the ER 1/29 with an episode of significant bleeding from the wound in the context of ongoing anticoagulation for DVT. Plans were made for observation, trending of hemoglobin, monitoring of INR and urology follow-up anticipated 1/30 in the morning.  Assessment/Plan 1. Bleeding from scrotal wound - has worsened now that he was started on heparin drip, believe we have no choice but to discontinue anticoagulation at this time despite recent DVT. Discussed with patient, he agrees.  He received 1 unit of PRBCs on 2/5 for hemoglobin of 6.8, hemoglobin has been remaining around 7.9.  2. Acute blood loss anemia on top of Anemia of chronic disease due to renal failure. Transfused 1 unit packed red blood cells during hemodialysis 2/5.  Following CBC. Hg improved at 7.9. Plans for transfusion at present.  3. Leukocytosis - WBC trending down with broader spectrum antibiotic coverage. Following.  No fever and blood cultures no growth to date.    Since planning on discharge home soon, will transition antibiotics over to by mouth doxycycline. 4. Scrotal abscess status post incision and drainage prior to admission. Pt still has hard indurated area in groin . Per urology no signs of identifiable abscess, no need for intervention at this time.  5. Right lower extremity DVT December 2017. Holding anticoagulation for now given significant bleeding at scrotal wound site.  6. End-stage renal disease. Management per nephrology.  HD per nephrology team  Had HD 2/5 and should have HD 2/7.  7. Diabetes mellitus type 2 with diabetic neuropathy.  BS well controlled.  8. Severe Constipation - Pt had several large  BMs, continue laxatives.  9. Tachycardia - improving, sinus tach suspect secondary to infection, following. No signs of sepsis physiology at this time.   DVT prophylaxis: SCDs  Code Status: full code Family Communication: Patient only Disposition Plan: home in 24-48 hours  03/12/2016, 4:20 PM  LOS: 8 days   Consultants:  Nephrology  Urology  Procedures:   I&D ?  Antimicrobials: Doxycycline  Interval history/Subjective: No complaints. Now wants to go to SNF.  Objective: Vitals:   03/12/16 1430 03/12/16 1500 03/12/16 1530 03/12/16 1600  BP: (!) 130/91 137/88 131/85 (!) 128/98  Pulse: 82 72 60 71  Resp:      Temp:      TempSrc:      SpO2:      Weight:      Height:        Intake/Output Summary (Last 24 hours) at 03/12/16 1620 Last data filed at 03/12/16 1500  Gross per 24 hour  Intake           785.42 ml  Output                0 ml  Net           785.42 ml     Filed Weights   03/07/16 1120 03/10/16 0745 03/12/16 1345  Weight: 113.9 kg (251 lb 1.7 oz) 113.9 kg (251 lb 1.7 oz) 113.9 kg (251 lb 1.7 oz)    Exam:    Constitutional: Appears calm, comfortable. Cardiovascular regular rate and rhythm. No murmur, rub or gallop. Respiratory. Clear to auscultation bilaterally. No wheezes, rales or rhonchi. Normal respiratory effort. Abdomen: soft, no masses  palpated, normal BS.  Scrotum continues to be swollen with hard indurated area in pubic area but no active bleeding seen. Ext: swollen RUE from IV infiltration improved from prior exam.  Results for orders placed or performed during the hospital encounter of 03/03/16  Culture, blood (Routine X 2) w Reflex to ID Panel  Result Value Ref Range   Specimen Description BLOOD BLOOD RIGHT HAND    Special Requests BOTTLES DRAWN AEROBIC AND ANAEROBIC 5CC    Culture NO GROWTH 4 DAYS    Report Status PENDING   Culture, blood (Routine X 2) w Reflex to ID Panel  Result Value Ref Range   Specimen Description BLOOD BLOOD RIGHT  HAND    Special Requests BOTTLES DRAWN AEROBIC AND ANAEROBIC 6CC    Culture NO GROWTH 4 DAYS    Report Status PENDING   CBC  Result Value Ref Range   WBC 12.5 (H) 4.0 - 10.5 K/uL   RBC 3.32 (L) 4.22 - 5.81 MIL/uL   Hemoglobin 9.6 (L) 13.0 - 17.0 g/dL   HCT 29.2 (L) 39.0 - 52.0 %   MCV 88.0 78.0 - 100.0 fL   MCH 28.9 26.0 - 34.0 pg   MCHC 32.9 30.0 - 36.0 g/dL   RDW 16.2 (H) 11.5 - 15.5 %   Platelets 270 150 - 400 K/uL  Protime-INR  Result Value Ref Range   Prothrombin Time 35.7 (H) 11.4 - 15.2 seconds   INR 8.41   Basic metabolic panel  Result Value Ref Range   Sodium 133 (L) 135 - 145 mmol/L   Potassium 4.9 3.5 - 5.1 mmol/L   Chloride 93 (L) 101 - 111 mmol/L   CO2 22 22 - 32 mmol/L   Glucose, Bld 77 65 - 99 mg/dL   BUN 98 (H) 6 - 20 mg/dL   Creatinine, Ser 15.50 (H) 0.61 - 1.24 mg/dL   Calcium 9.0 8.9 - 10.3 mg/dL   GFR calc non Af Amer 3 (L) >60 mL/min   GFR calc Af Amer 4 (L) >60 mL/min   Anion gap 18 (H) 5 - 15  Glucose, capillary  Result Value Ref Range   Glucose-Capillary 68 65 - 99 mg/dL   Comment 1 Notify RN    Comment 2 Document in Chart   Basic metabolic panel  Result Value Ref Range   Sodium 134 (L) 135 - 145 mmol/L   Potassium 5.4 (H) 3.5 - 5.1 mmol/L   Chloride 94 (L) 101 - 111 mmol/L   CO2 22 22 - 32 mmol/L   Glucose, Bld 92 65 - 99 mg/dL   BUN 112 (H) 6 - 20 mg/dL   Creatinine, Ser 17.23 (H) 0.61 - 1.24 mg/dL   Calcium 8.8 (L) 8.9 - 10.3 mg/dL   GFR calc non Af Amer 3 (L) >60 mL/min   GFR calc Af Amer 3 (L) >60 mL/min   Anion gap 18 (H) 5 - 15  CBC  Result Value Ref Range   WBC 12.2 (H) 4.0 - 10.5 K/uL   RBC 2.67 (L) 4.22 - 5.81 MIL/uL   Hemoglobin 7.6 (L) 13.0 - 17.0 g/dL   HCT 23.5 (L) 39.0 - 52.0 %   MCV 88.0 78.0 - 100.0 fL   MCH 28.5 26.0 - 34.0 pg   MCHC 32.3 30.0 - 36.0 g/dL   RDW 15.5 11.5 - 15.5 %   Platelets 266 150 - 400 K/uL  Protime-INR  Result Value Ref Range   Prothrombin Time 37.4 (H) 11.4 -  15.2 seconds   INR 3.68     Glucose, capillary  Result Value Ref Range   Glucose-Capillary 114 (H) 65 - 99 mg/dL   Comment 1 Notify RN    Comment 2 Document in Chart   Glucose, capillary  Result Value Ref Range   Glucose-Capillary 97 65 - 99 mg/dL   Comment 1 Notify RN    Comment 2 Document in Chart   CBC  Result Value Ref Range   WBC 13.4 (H) 4.0 - 10.5 K/uL   RBC 2.63 (L) 4.22 - 5.81 MIL/uL   Hemoglobin 7.8 (L) 13.0 - 17.0 g/dL   HCT 22.8 (L) 39.0 - 52.0 %   MCV 86.7 78.0 - 100.0 fL   MCH 29.7 26.0 - 34.0 pg   MCHC 34.2 30.0 - 36.0 g/dL   RDW 16.4 (H) 11.5 - 15.5 %   Platelets 258 150 - 400 K/uL  Glucose, capillary  Result Value Ref Range   Glucose-Capillary 89 65 - 99 mg/dL   Comment 1 Notify RN    Comment 2 Document in Chart   CBC  Result Value Ref Range   WBC 15.0 (H) 4.0 - 10.5 K/uL   RBC 2.65 (L) 4.22 - 5.81 MIL/uL   Hemoglobin 7.8 (L) 13.0 - 17.0 g/dL   HCT 23.0 (L) 39.0 - 52.0 %   MCV 86.8 78.0 - 100.0 fL   MCH 29.4 26.0 - 34.0 pg   MCHC 33.9 30.0 - 36.0 g/dL   RDW 16.3 (H) 11.5 - 15.5 %   Platelets 294 150 - 400 K/uL  Glucose, capillary  Result Value Ref Range   Glucose-Capillary 85 65 - 99 mg/dL   Comment 1 Notify RN    Comment 2 Document in Chart   Glucose, capillary  Result Value Ref Range   Glucose-Capillary 91 65 - 99 mg/dL   Comment 1 Notify RN    Comment 2 Document in Chart   CBC  Result Value Ref Range   WBC 14.2 (H) 4.0 - 10.5 K/uL   RBC 2.56 (L) 4.22 - 5.81 MIL/uL   Hemoglobin 7.6 (L) 13.0 - 17.0 g/dL   HCT 22.5 (L) 39.0 - 52.0 %   MCV 87.9 78.0 - 100.0 fL   MCH 29.7 26.0 - 34.0 pg   MCHC 33.8 30.0 - 36.0 g/dL   RDW 16.4 (H) 11.5 - 15.5 %   Platelets 275 150 - 400 K/uL  Renal function panel  Result Value Ref Range   Sodium 133 (L) 135 - 145 mmol/L   Potassium 4.7 3.5 - 5.1 mmol/L   Chloride 95 (L) 101 - 111 mmol/L   CO2 27 22 - 32 mmol/L   Glucose, Bld 132 (H) 65 - 99 mg/dL   BUN 50 (H) 6 - 20 mg/dL   Creatinine, Ser 10.16 (H) 0.61 - 1.24 mg/dL   Calcium  8.8 (L) 8.9 - 10.3 mg/dL   Phosphorus 6.7 (H) 2.5 - 4.6 mg/dL   Albumin 2.6 (L) 3.5 - 5.0 g/dL   GFR calc non Af Amer 5 (L) >60 mL/min   GFR calc Af Amer 6 (L) >60 mL/min   Anion gap 11 5 - 15  Protime-INR  Result Value Ref Range   Prothrombin Time 24.6 (H) 11.4 - 15.2 seconds   INR 2.18   Glucose, capillary  Result Value Ref Range   Glucose-Capillary 86 65 - 99 mg/dL   Comment 1 Notify RN    Comment 2 Document in Chart  Glucose, capillary  Result Value Ref Range   Glucose-Capillary 103 (H) 65 - 99 mg/dL  Glucose, capillary  Result Value Ref Range   Glucose-Capillary 107 (H) 65 - 99 mg/dL  CBC  Result Value Ref Range   WBC 18.5 (H) 4.0 - 10.5 K/uL   RBC 2.50 (L) 4.22 - 5.81 MIL/uL   Hemoglobin 7.3 (L) 13.0 - 17.0 g/dL   HCT 22.4 (L) 39.0 - 52.0 %   MCV 89.6 78.0 - 100.0 fL   MCH 29.2 26.0 - 34.0 pg   MCHC 32.6 30.0 - 36.0 g/dL   RDW 16.1 (H) 11.5 - 15.5 %   Platelets 312 150 - 400 K/uL  Renal function panel  Result Value Ref Range   Sodium 134 (L) 135 - 145 mmol/L   Potassium 4.5 3.5 - 5.1 mmol/L   Chloride 96 (L) 101 - 111 mmol/L   CO2 28 22 - 32 mmol/L   Glucose, Bld 91 65 - 99 mg/dL   BUN 42 (H) 6 - 20 mg/dL   Creatinine, Ser 9.08 (H) 0.61 - 1.24 mg/dL   Calcium 9.3 8.9 - 10.3 mg/dL   Phosphorus 7.3 (H) 2.5 - 4.6 mg/dL   Albumin 2.8 (L) 3.5 - 5.0 g/dL   GFR calc non Af Amer 6 (L) >60 mL/min   GFR calc Af Amer 7 (L) >60 mL/min   Anion gap 10 5 - 15  Protime-INR  Result Value Ref Range   Prothrombin Time 17.5 (H) 11.4 - 15.2 seconds   INR 1.42   Glucose, capillary  Result Value Ref Range   Glucose-Capillary 110 (H) 65 - 99 mg/dL  Glucose, capillary  Result Value Ref Range   Glucose-Capillary 90 65 - 99 mg/dL   Comment 1 Notify RN    Comment 2 Document in Chart   Glucose, capillary  Result Value Ref Range   Glucose-Capillary 85 65 - 99 mg/dL   Comment 1 Notify RN    Comment 2 Document in Chart   Glucose, capillary  Result Value Ref Range    Glucose-Capillary 90 65 - 99 mg/dL  Glucose, capillary  Result Value Ref Range   Glucose-Capillary 90 65 - 99 mg/dL  Heparin level (unfractionated)  Result Value Ref Range   Heparin Unfractionated <0.10 (L) 0.30 - 0.70 IU/mL  Glucose, capillary  Result Value Ref Range   Glucose-Capillary 106 (H) 65 - 99 mg/dL  CBC  Result Value Ref Range   WBC 21.3 (H) 4.0 - 10.5 K/uL   RBC 2.47 (L) 4.22 - 5.81 MIL/uL   Hemoglobin 7.2 (L) 13.0 - 17.0 g/dL   HCT 22.2 (L) 39.0 - 52.0 %   MCV 89.9 78.0 - 100.0 fL   MCH 29.1 26.0 - 34.0 pg   MCHC 32.4 30.0 - 36.0 g/dL   RDW 16.3 (H) 11.5 - 15.5 %   Platelets 363 150 - 400 K/uL  Renal function panel  Result Value Ref Range   Sodium 134 (L) 135 - 145 mmol/L   Potassium 4.6 3.5 - 5.1 mmol/L   Chloride 94 (L) 101 - 111 mmol/L   CO2 25 22 - 32 mmol/L   Glucose, Bld 96 65 - 99 mg/dL   BUN 64 (H) 6 - 20 mg/dL   Creatinine, Ser 12.39 (H) 0.61 - 1.24 mg/dL   Calcium 9.3 8.9 - 10.3 mg/dL   Phosphorus 9.4 (H) 2.5 - 4.6 mg/dL   Albumin 3.0 (L) 3.5 - 5.0 g/dL   GFR calc non Af  Amer 4 (L) >60 mL/min   GFR calc Af Amer 5 (L) >60 mL/min   Anion gap 15 5 - 15  Protime-INR  Result Value Ref Range   Prothrombin Time 18.0 (H) 11.4 - 15.2 seconds   INR 1.47   Heparin level (unfractionated)  Result Value Ref Range   Heparin Unfractionated <0.10 (L) 0.30 - 0.70 IU/mL  Glucose, capillary  Result Value Ref Range   Glucose-Capillary 107 (H) 65 - 99 mg/dL   Comment 1 Notify RN    Comment 2 Document in Chart   Heparin level (unfractionated)  Result Value Ref Range   Heparin Unfractionated 0.85 (H) 0.30 - 0.70 IU/mL  Glucose, capillary  Result Value Ref Range   Glucose-Capillary 72 65 - 99 mg/dL  Glucose, capillary  Result Value Ref Range   Glucose-Capillary 119 (H) 65 - 99 mg/dL  CBC  Result Value Ref Range   WBC 31.8 (H) 4.0 - 10.5 K/uL   RBC 2.63 (L) 4.22 - 5.81 MIL/uL   Hemoglobin 7.7 (L) 13.0 - 17.0 g/dL   HCT 23.6 (L) 39.0 - 52.0 %   MCV 89.7 78.0  - 100.0 fL   MCH 29.3 26.0 - 34.0 pg   MCHC 32.6 30.0 - 36.0 g/dL   RDW 16.4 (H) 11.5 - 15.5 %   Platelets 353 150 - 400 K/uL  Renal function panel  Result Value Ref Range   Sodium 134 (L) 135 - 145 mmol/L   Potassium 4.1 3.5 - 5.1 mmol/L   Chloride 96 (L) 101 - 111 mmol/L   CO2 28 22 - 32 mmol/L   Glucose, Bld 91 65 - 99 mg/dL   BUN 37 (H) 6 - 20 mg/dL   Creatinine, Ser 9.45 (H) 0.61 - 1.24 mg/dL   Calcium 8.8 (L) 8.9 - 10.3 mg/dL   Phosphorus 7.0 (H) 2.5 - 4.6 mg/dL   Albumin 2.7 (L) 3.5 - 5.0 g/dL   GFR calc non Af Amer 6 (L) >60 mL/min   GFR calc Af Amer 7 (L) >60 mL/min   Anion gap 10 5 - 15  Protime-INR  Result Value Ref Range   Prothrombin Time 18.9 (H) 11.4 - 15.2 seconds   INR 1.56   Heparin level (unfractionated)  Result Value Ref Range   Heparin Unfractionated 0.92 (H) 0.30 - 0.70 IU/mL  Glucose, capillary  Result Value Ref Range   Glucose-Capillary 104 (H) 65 - 99 mg/dL  Glucose, capillary  Result Value Ref Range   Glucose-Capillary 117 (H) 65 - 99 mg/dL   Comment 1 Notify RN    Comment 2 Document in Chart   Glucose, capillary  Result Value Ref Range   Glucose-Capillary 101 (H) 65 - 99 mg/dL   Comment 1 Notify RN    Comment 2 Document in Chart   Heparin level (unfractionated)  Result Value Ref Range   Heparin Unfractionated 0.34 0.30 - 0.70 IU/mL  Glucose, capillary  Result Value Ref Range   Glucose-Capillary 110 (H) 65 - 99 mg/dL   Comment 1 Notify RN    Comment 2 Document in Chart   Glucose, capillary  Result Value Ref Range   Glucose-Capillary 97 65 - 99 mg/dL   Comment 1 Notify RN    Comment 2 Document in Chart   Protime-INR  Result Value Ref Range   Prothrombin Time 17.8 (H) 11.4 - 15.2 seconds   INR 1.46   Heparin level (unfractionated)  Result Value Ref Range   Heparin Unfractionated 0.24 (L)  0.30 - 0.70 IU/mL  CBC with Differential/Platelet  Result Value Ref Range   WBC 24.6 (H) 4.0 - 10.5 K/uL   RBC 2.30 (L) 4.22 - 5.81 MIL/uL    Hemoglobin 6.8 (LL) 13.0 - 17.0 g/dL   HCT 20.6 (L) 39.0 - 52.0 %   MCV 89.6 78.0 - 100.0 fL   MCH 29.6 26.0 - 34.0 pg   MCHC 33.0 30.0 - 36.0 g/dL   RDW 16.5 (H) 11.5 - 15.5 %   Platelets 284 150 - 400 K/uL   Neutrophils Relative % 84 %   Neutro Abs 20.7 (H) 1.7 - 7.7 K/uL   Lymphocytes Relative 9 %   Lymphs Abs 2.2 0.7 - 4.0 K/uL   Monocytes Relative 6 %   Monocytes Absolute 1.5 (H) 0.1 - 1.0 K/uL   Eosinophils Relative 1 %   Eosinophils Absolute 0.2 0.0 - 0.7 K/uL   Basophils Relative 0 %   Basophils Absolute 0.0 0.0 - 0.1 K/uL  Glucose, capillary  Result Value Ref Range   Glucose-Capillary 109 (H) 65 - 99 mg/dL  Glucose, capillary  Result Value Ref Range   Glucose-Capillary 96 65 - 99 mg/dL  Glucose, capillary  Result Value Ref Range   Glucose-Capillary 86 65 - 99 mg/dL  Glucose, capillary  Result Value Ref Range   Glucose-Capillary 99 65 - 99 mg/dL  Heparin level (unfractionated)  Result Value Ref Range   Heparin Unfractionated 0.44 0.30 - 0.70 IU/mL  Glucose, capillary  Result Value Ref Range   Glucose-Capillary 95 65 - 99 mg/dL   Comment 1 Notify RN    Comment 2 Document in Chart   Glucose, capillary  Result Value Ref Range   Glucose-Capillary 71 65 - 99 mg/dL  Protime-INR  Result Value Ref Range   Prothrombin Time 17.3 (H) 11.4 - 15.2 seconds   INR 1.40   Heparin level (unfractionated)  Result Value Ref Range   Heparin Unfractionated 0.33 0.30 - 0.70 IU/mL  CBC with Differential/Platelet  Result Value Ref Range   WBC 22.8 (H) 4.0 - 10.5 K/uL   RBC 2.29 (L) 4.22 - 5.81 MIL/uL   Hemoglobin 6.8 (LL) 13.0 - 17.0 g/dL   HCT 20.5 (L) 39.0 - 52.0 %   MCV 89.5 78.0 - 100.0 fL   MCH 29.7 26.0 - 34.0 pg   MCHC 33.2 30.0 - 36.0 g/dL   RDW 16.5 (H) 11.5 - 15.5 %   Platelets 316 150 - 400 K/uL   Neutrophils Relative % 86 %   Neutro Abs 19.6 (H) 1.7 - 7.7 K/uL   Lymphocytes Relative 8 %   Lymphs Abs 1.7 0.7 - 4.0 K/uL   Monocytes Relative 5 %   Monocytes  Absolute 1.2 (H) 0.1 - 1.0 K/uL   Eosinophils Relative 1 %   Eosinophils Absolute 0.3 0.0 - 0.7 K/uL   Basophils Relative 0 %   Basophils Absolute 0.0 0.0 - 0.1 K/uL  Renal function panel  Result Value Ref Range   Sodium 134 (L) 135 - 145 mmol/L   Potassium 4.5 3.5 - 5.1 mmol/L   Chloride 94 (L) 101 - 111 mmol/L   CO2 24 22 - 32 mmol/L   Glucose, Bld 98 65 - 99 mg/dL   BUN 83 (H) 6 - 20 mg/dL   Creatinine, Ser 15.70 (H) 0.61 - 1.24 mg/dL   Calcium 8.7 (L) 8.9 - 10.3 mg/dL   Phosphorus 8.8 (H) 2.5 - 4.6 mg/dL   Albumin 2.3 (L) 3.5 -  5.0 g/dL   GFR calc non Af Amer 3 (L) >60 mL/min   GFR calc Af Amer 4 (L) >60 mL/min   Anion gap 16 (H) 5 - 15  Glucose, capillary  Result Value Ref Range   Glucose-Capillary 108 (H) 65 - 99 mg/dL   Comment 1 Notify RN    Comment 2 Document in Chart   Glucose, capillary  Result Value Ref Range   Glucose-Capillary 102 (H) 65 - 99 mg/dL   Comment 1 Notify RN    Comment 2 Document in Chart   Glucose, capillary  Result Value Ref Range   Glucose-Capillary 103 (H) 65 - 99 mg/dL  Glucose, capillary  Result Value Ref Range   Glucose-Capillary 127 (H) 65 - 99 mg/dL  Glucose, capillary  Result Value Ref Range   Glucose-Capillary 110 (H) 65 - 99 mg/dL  Protime-INR  Result Value Ref Range   Prothrombin Time 16.4 (H) 11.4 - 15.2 seconds   INR 1.31   Heparin level (unfractionated)  Result Value Ref Range   Heparin Unfractionated 0.28 (L) 0.30 - 0.70 IU/mL  CBC with Differential/Platelet  Result Value Ref Range   WBC 16.6 (H) 4.0 - 10.5 K/uL   RBC 2.50 (L) 4.22 - 5.81 MIL/uL   Hemoglobin 7.4 (L) 13.0 - 17.0 g/dL   HCT 22.6 (L) 39.0 - 52.0 %   MCV 90.4 78.0 - 100.0 fL   MCH 29.6 26.0 - 34.0 pg   MCHC 32.7 30.0 - 36.0 g/dL   RDW 16.3 (H) 11.5 - 15.5 %   Platelets 308 150 - 400 K/uL   Neutrophils Relative % 78 %   Neutro Abs 13.1 (H) 1.7 - 7.7 K/uL   Lymphocytes Relative 10 %   Lymphs Abs 1.6 0.7 - 4.0 K/uL   Monocytes Relative 10 %   Monocytes  Absolute 1.6 (H) 0.1 - 1.0 K/uL   Eosinophils Relative 2 %   Eosinophils Absolute 0.3 0.0 - 0.7 K/uL   Basophils Relative 0 %   Basophils Absolute 0.0 0.0 - 0.1 K/uL  Glucose, capillary  Result Value Ref Range   Glucose-Capillary 99 65 - 99 mg/dL   Comment 1 Notify RN    Comment 2 Document in Chart   Glucose, capillary  Result Value Ref Range   Glucose-Capillary 117 (H) 65 - 99 mg/dL  Glucose, capillary  Result Value Ref Range   Glucose-Capillary 99 65 - 99 mg/dL  Heparin level (unfractionated)  Result Value Ref Range   Heparin Unfractionated 0.78 (H) 0.30 - 0.70 IU/mL  Glucose, capillary  Result Value Ref Range   Glucose-Capillary 89 65 - 99 mg/dL  Glucose, capillary  Result Value Ref Range   Glucose-Capillary 86 65 - 99 mg/dL   Comment 1 Notify RN    Comment 2 Document in Chart   Protime-INR  Result Value Ref Range   Prothrombin Time 16.8 (H) 11.4 - 15.2 seconds   INR 1.35   CBC  Result Value Ref Range   WBC 13.4 (H) 4.0 - 10.5 K/uL   RBC 2.69 (L) 4.22 - 5.81 MIL/uL   Hemoglobin 7.9 (L) 13.0 - 17.0 g/dL   HCT 24.3 (L) 39.0 - 52.0 %   MCV 90.3 78.0 - 100.0 fL   MCH 29.4 26.0 - 34.0 pg   MCHC 32.5 30.0 - 36.0 g/dL   RDW 16.0 (H) 11.5 - 15.5 %   Platelets 309 150 - 400 K/uL  Renal function panel  Result Value Ref Range  Sodium 133 (L) 135 - 145 mmol/L   Potassium 3.9 3.5 - 5.1 mmol/L   Chloride 93 (L) 101 - 111 mmol/L   CO2 25 22 - 32 mmol/L   Glucose, Bld 104 (H) 65 - 99 mg/dL   BUN 56 (H) 6 - 20 mg/dL   Creatinine, Ser 12.52 (H) 0.61 - 1.24 mg/dL   Calcium 8.9 8.9 - 10.3 mg/dL   Phosphorus 10.1 (H) 2.5 - 4.6 mg/dL   Albumin 2.4 (L) 3.5 - 5.0 g/dL   GFR calc non Af Amer 4 (L) >60 mL/min   GFR calc Af Amer 5 (L) >60 mL/min   Anion gap 15 5 - 15  Glucose, capillary  Result Value Ref Range   Glucose-Capillary 103 (H) 65 - 99 mg/dL   Comment 1 Notify RN    Comment 2 Document in Chart   Glucose, capillary  Result Value Ref Range   Glucose-Capillary 74 65 -  99 mg/dL  Heparin level (unfractionated)  Result Value Ref Range   Heparin Unfractionated 0.35 0.30 - 0.70 IU/mL  Glucose, capillary  Result Value Ref Range   Glucose-Capillary 81 65 - 99 mg/dL  Glucose, capillary  Result Value Ref Range   Glucose-Capillary 73 65 - 99 mg/dL  Type and screen [facility]  Result Value Ref Range   ABO/RH(D) A POS    Antibody Screen NEG    Sample Expiration 03/07/2016    Unit Number [id number]    Blood Component Type RED CELLS,LR    Unit division 00    Status of Unit ISSUED,FINAL    Transfusion Status OK TO TRANSFUSE    Crossmatch Result Compatible    Unit Number [id number]    Blood Component Type RED CELLS,LR    Unit division 00    Status of Unit REL FROM [facility]    Transfusion Status OK TO TRANSFUSE    Crossmatch Result Compatible    Unit Number [id number]    Blood Component Type RED CELLS,LR    Unit division 00    Status of Unit REL FROM [facility]    Transfusion Status OK TO TRANSFUSE    Crossmatch Result Compatible    Unit Number [id number]    Blood Component Type RED CELLS,LR    Unit division 00    Status of Unit REL FROM [facility]    Transfusion Status OK TO TRANSFUSE    Crossmatch Result Compatible   Prepare RBC  Result Value Ref Range   Order Confirmation ORDER PROCESSED BY BLOOD BANK   Type and screen [facility]  Result Value Ref Range   ABO/RH(D) A POS    Antibody Screen NEG    Sample Expiration 03/12/2016    Unit Number [id number]    Blood Component Type RED CELLS,LR    Unit division 00    Status of Unit ISSUED,FINAL    Transfusion Status OK TO TRANSFUSE    Crossmatch Result Compatible   Prepare RBC  Result Value Ref Range   Order Confirmation ORDER PROCESSED BY BLOOD BANK    Scheduled Meds: . epoetin (EPOGEN/PROCRIT) injection  10,000 Units Intravenous Q M,W,F-HD  . febuxostat  80 mg Oral Daily  . gabapentin  100 mg Oral Daily  . heparin      . insulin aspart  0-15 Units Subcutaneous TID WC    . piperacillin-tazobactam (ZOSYN)  IV  3.375 g Intravenous Q12H  . senna-docusate  2 tablet Oral BID  . sevelamer carbonate  3,200 mg Oral TID WC  .  vancomycin  1,000 mg Intravenous Q M,W,F-HD   Continuous Infusions: . heparin 2,050 Units/hr (03/12/16 0843)   Principal Problem:   Bleeding from wound Active Problems:   Weakness of both legs   Arthritis, gouty   Hypertension   ESRD on dialysis ([place])   RBBB   Scrotal abscess   Type 2 diabetes mellitus (HCC)   Chronic anticoagulation   Constipation   Anemia in chronic kidney disease (CKD)   LOS: 8 days   HERNANDEZ ACOSTA,ESTELA 03/12/2016 4:20 PM

## 2016-03-12 NOTE — Care Management Note (Signed)
Case Management Note  Patient Details  Name: Jack Irwin MRN: [id number] Date of Birth: 1965/06/30  Expected Discharge Date:                  Expected Discharge Plan:  [place]  In-House Referral:  Clinical Social Work  Discharge planning Services  CM Consult  Post Acute Care Choice:  Home Health Choice offered to:  Patient  DME Arranged:  3-N-1, [place] rolling DME Agency:  [place]:  RN, PT [facility] Agency:  [place]  Status of Service:  Completed, signed off  If discussed at [place] of Stay Meetings, dates discussed:    Additional Comments: Patient will need [place] RN at discharge for wound care. Will add PT to [facility] services as well. Patient will need RW and 3 in 1. Offered choice of HH/DME agencies. [place] [facility] notified of orders and will obtain from chart. Will update [facility] when patient discharges.   Rebeccah Ivins, Chauncey [place], RN 03/12/2016, 9:49 AM

## 2016-03-12 NOTE — Care Management (Signed)
CM spoke with patient's wife regarding discharge plan. She is agreeable to [facility] and DME ordered. [facility] will call patient regarding possible delivery time of DME.

## 2016-03-12 NOTE — Progress Notes (Signed)
ANTICOAGULATION CONSULT NOTE :  Follow up  Pharmacy Consult for Heparin Indication: VTE treatment  No Known Allergies  Patient Measurements: Height: 5\' 11"  (180.3 cm) Weight: 251 lb 1.7 oz (113.9 kg) IBW/kg (Calculated) : 75.3 HEPARIN DW (KG): 101.6  Vital Signs: Temp: 98 F (36.7 C) (02/06 2122) Temp Source: Oral (02/06 2122) BP: 110/73 (02/07 0600) Pulse Rate: 75 (02/07 0600)  Labs:  Recent Labs  03/10/16 0616 03/11/16 0610 03/11/16 1257 03/12/16 0531 03/12/16 0534  HGB 6.8* 7.4*  --  7.9*  --   HCT 20.5* 22.6*  --  24.3*  --   PLT 316 308  --  309  --   LABPROT 17.3* 16.4*  --  16.8*  --   INR 1.40 1.31  --  1.35  --   HEPARINUNFRC 0.33 0.28* 0.78* 0.35  --   CREATININE 15.70*  --   --   --  12.52*   Estimated Creatinine Clearance: 9.3 mL/min (by C-G formula based on SCr of 12.52 mg/dL (H)).  Medical History: Past Medical History:  Diagnosis Date  . Anemia   . Arthritis   . BPH (benign prostatic hyperplasia)   . Chronic back pain   . Difficulty walking   . DVT (deep venous thrombosis) ([place])    Right popliteal DVT December 2017  . End-stage renal disease on hemodialysis ([place])    Dr. Lowanda Foster  . Erectile dysfunction   . Essential hypertension   . Gout   . Neuropathy, diabetic ([place])   . Type 2 diabetes mellitus ([place])   . Venous (peripheral) insufficiency    Medications:  Prescriptions Prior to Admission  Medication Sig Dispense Refill Last Dose  . clindamycin (CLEOCIN) 300 MG capsule Take 1 capsule (300 mg total) by mouth every 8 (eight) hours. 30 capsule 0 03/03/2016 at Unknown time  . colchicine 0.6 MG tablet 1/2 tab po 2 times /week (Patient taking differently: Take 0.3 mg by mouth daily as needed (for gout). ) 15 tablet 0 unknown  . gabapentin (NEURONTIN) 100 MG capsule Take 100 mg by mouth daily.    03/04/2016 at Unknown time  . Multiple Vitamin (DAILY VITE PO) Take 1 tablet by mouth daily.   unknown  . sevelamer carbonate (RENVELA) 800 MG tablet  Take 4 tablets (3,200 mg total) by mouth 3 (three) times daily with meals. 90 tablet 0 03/03/2016 at Unknown time  . sevelamer carbonate (RENVELA) 800 MG tablet Take 1 tablet (800 mg total) by mouth 2 (two) times daily between meals as needed (With snacks). 60 tablet 0 03/03/2016 at Unknown time  . sildenafil (REVATIO) 20 MG tablet Take 20 mg by mouth daily as needed. Dissolved under tongue (TROCHE) 60 minutes prior to sexual encounter   unknown  . traMADol (ULTRAM) 50 MG tablet Take 1 tablet (50 mg total) by mouth daily as needed (with dressing changes). 30 tablet 0 03/03/2016 at Unknown time  . ULORIC 80 MG TABS Take 80 mg by mouth daily.   03/03/2016 at Unknown time  . warfarin (COUMADIN) 5 MG tablet Take 2.5 mg by mouth daily. Take 1/2 tablet daily   Past Week at 1900   Assessment: 51 year old male with multiple medical problems including ESRD on HD,  recently discharge January 26 for scrotal abscess that was I/D. Presented to the ER 1/29 with an episode of significant bleeding from the wound in the context of ongoing anticoagulation for DVT.There is no ongoing bleeding. He was transfused and given Vit. K 1mg  on 1/30.  Asked to restart anticoagulation with heparin.  Heparin level this am is therapeutic.  CBC appears stable.  Goal of Therapy:  Anti-Xa level 0.3-7  Monitor platelets by anticoagulation protocol: Yes   Plan:  Continue heparin infusion at 2050 units/hr Check anti-Xa level daily while on heparin Continue to monitor H&H and platelets  Thanks for allowing pharmacy to be a part of this patient's care.  Hart Robinsons, PharmD Clinical Pharmacist Pager:  [phone] 03/12/2016   03/12/2016,7:49 AM

## 2016-03-12 NOTE — Care Management Important Message (Signed)
Important Message  Patient Details  Name: Jack Irwin MRN: [id number] Date of Birth: February 25, 1965   Medicare Important Message Given:  Yes    Dianelly Ferran, [place]un[place]y [place], RN 03/12/2016, 4:27 PM

## 2016-03-13 DIAGNOSIS — Z7901 Long term (current) use of anticoagulants: Secondary | ICD-10-CM

## 2016-03-13 LAB — CBC
HCT: 26.1 % — ABNORMAL LOW (ref 39.0–52.0)
Hemoglobin: 8.3 g/dL — ABNORMAL LOW (ref 13.0–17.0)
MCH: 28.9 pg (ref 26.0–34.0)
MCHC: 31.8 g/dL (ref 30.0–36.0)
MCV: 90.9 fL (ref 78.0–100.0)
PLATELETS: 280 10*3/uL (ref 150–400)
RBC: 2.87 MIL/uL — ABNORMAL LOW (ref 4.22–5.81)
RDW: 15.9 % — AB (ref 11.5–15.5)
WBC: 12 10*3/uL — AB (ref 4.0–10.5)

## 2016-03-13 LAB — CULTURE, BLOOD (ROUTINE X 2)
Culture: NO GROWTH
Culture: NO GROWTH

## 2016-03-13 LAB — BASIC METABOLIC PANEL
Anion gap: 11 (ref 5–15)
BUN: 39 mg/dL — AB (ref 6–20)
CALCIUM: 9.4 mg/dL (ref 8.9–10.3)
CO2: 29 mmol/L (ref 22–32)
CREATININE: 10.21 mg/dL — AB (ref 0.61–1.24)
Chloride: 94 mmol/L — ABNORMAL LOW (ref 101–111)
GFR calc Af Amer: 6 mL/min — ABNORMAL LOW (ref >60)
GFR, EST NON AFRICAN AMERICAN: 5 mL/min — AB (ref >60)
GLUCOSE: 79 mg/dL (ref 65–99)
Potassium: 4.3 mmol/L (ref 3.5–5.1)
SODIUM: 134 mmol/L — AB (ref 135–145)

## 2016-03-13 LAB — GLUCOSE, CAPILLARY
GLUCOSE-CAPILLARY: 90 mg/dL (ref 65–99)
Glucose-Capillary: 76 mg/dL (ref 65–99)
Glucose-Capillary: 102 mg/dL — ABNORMAL HIGH (ref 65–99)

## 2016-03-13 LAB — HEPARIN LEVEL (UNFRACTIONATED)

## 2016-03-13 LAB — PROTIME-INR
INR: 1.17
PROTHROMBIN TIME: 15 s (ref 11.4–15.2)

## 2016-03-13 MED ORDER — DOXYCYCLINE HYCLATE 100 MG PO TABS: 100.0000 mg | ORAL_TABLET | Freq: Two times a day (BID) | ORAL | 0 refills | Status: DC

## 2016-03-13 NOTE — Care Management (Signed)
EMS transport arranged for 4 pm per patient request. Wife updated and states she will be at house to receive patient.

## 2016-03-13 NOTE — Progress Notes (Signed)
Subjective: Interval History: The patient presently offers no complaints. He denies any difficulty breathing. Weakness also has improved.  Objective: Vital signs in last 24 hours: Temp:  [98 F (36.7 C)-98.3 F (36.8 C)] 98 F (36.7 C) (02/07 2121) Pulse Rate:  [60-89] 77 (02/07 2121) Resp:  [20] 20 (02/07 2121) BP: (128-143)/(63-98) 139/69 (02/07 2121) SpO2:  [97 %] 97 % (02/07 2121) Weight:  [113.9 kg (251 lb 1.7 oz)] 113.9 kg (251 lb 1.7 oz) (02/07 1345) Weight change:   Intake/Output from previous day: 02/07 0701 - 02/08 0700 In: 424.5 [P.O.:240; I.V.:184.5] Out: 1350  Intake/Output this shift: No intake/output data recorded.  General appearance: alert, cooperative and no distress Resp: clear to auscultation bilaterally Cardio: regular rate and rhythm Male genitalia: Patient with scrotal swelling and edema. Extremities: venous stasis dermatitis noted  Lab Results:  Recent Labs  03/12/16 0531 03/13/16 0604  WBC 13.4* 12.0*  HGB 7.9* 8.3*  HCT 24.3* 26.1*  PLT 309 280   BMET:   Recent Labs  03/12/16 0534 03/13/16 0604  NA 133* 134*  K 3.9 4.3  CL 93* 94*  CO2 25 29  GLUCOSE 104* 79  BUN 56* 39*  CREATININE 12.52* 10.21*  CALCIUM 8.9 9.4   No results for input(s): PTH in the last 72 hours. Iron Studies: No results for input(s): IRON, TIBC, TRANSFERRIN, FERRITIN in the last 72 hours.  Studies/Results: No results found.  I have reviewed the patient's current medications.  Assessment/Plan: Problem #1 scrotal abscess: Status D&C. Patient is on antibiotics. He is afebrile But his white blood cell count remains elevated. Problem #2 end-stage renal disease: Patient Status post hemodialysis yesterday. Patient at this moment doesn't have any uremic sinus symptoms. Problem #3 anemia: Possibly a combination of bleeding and anemia of chronic disease. He status post blood transfusion. His hemoglobin is low but is much better. Problem #4 metabolic bone disease:  His calcium is range but phosphorus is high . Patient is on a binder. Problem #5 fluid management: Patient denies any difficulty breathing. Presently he doesn't have any significant sign of fluid overload. Problem #6 history of cardiomyopathy Problem #7 history of obesity Plan: 1] patient doesn't require dialysis today 2] we'll make arrangement for dialysis in the morning. 3] we'll check CBC and renal panel in the morning.  LOS: 9 days   Kameryn Davern S 03/13/2016,11:07 AM

## 2016-03-13 NOTE — Clinical Social Work Note (Signed)
CSW met with pt for transportation concerns to dialysis. CSW reminded pt of previous conversation that EMS cannot transport him to dialysis and he would have to arrange something else. He is aware to contact dialysis social worker if needed. Pt requesting EMS to get home. CM notified.   Benay Pike, [place]

## 2016-03-13 NOTE — Discharge Summary (Signed)
Physician Discharge Summary  MADDUX VANSCYOC RKY:[id number] DOB: 07/07/1965 DOA: 03/03/2016  PCP: Kingsley Callander, FNP  Admit date: 03/03/2016 Discharge date: 03/13/2016  Time spent: 45 minutes  Recommendations for Outpatient Follow-up:  -To be discharged home today. -Advised to follow-up with PCP in 2 weeks, further discussions about anticoagulation need to be had.   Discharge Diagnoses:  Principal Problem:   Bleeding from wound Active Problems:   Weakness of both legs   Arthritis, gouty   Hypertension   ESRD on dialysis ([place])   RBBB   Scrotal abscess   Type 2 diabetes mellitus (HCC)   Chronic anticoagulation   Constipation   Anemia in chronic kidney disease (CKD)   Discharge Condition: Stable and improved  Filed Weights   03/07/16 1120 03/10/16 0745 03/12/16 1345  Weight: 113.9 kg (251 lb 1.7 oz) 113.9 kg (251 lb 1.7 oz) 113.9 kg (251 lb 1.7 oz)    History of present illness:  As per Dr. Lorin Mercy on 1/29:  Jack Irwin is a 51 y.o. male with medical history significant of ESRD on hemodialysis, anemia of CKD, chronic back pain, DVT on anticoagulation, HTN, gout, and DM presenting with persistent scrotal swelling after I&D.  He was hospitalized at [facility] from 1/22-26 for scrotal abscess and was discharged on Clindamycin with daily dressing changes/packing.  Has had ongoing swelling.  Today he was sleeping and he felt something wet, it had burst open and was bleeding.  He is on Coumadin for prior DVT.  Bled "a whole lot" - wet the whole bed, mattress, cover.  Not light-headed/dizzy.  Scheduled for dialysis today but he was hurting and swiollen so didn't go.  No BM since last Saturday.   Hospital Course:   1. Bleeding from scrotal wound - has worsened now that he was started on heparin drip, believe we have no choice but to discontinue anticoagulation at this time despite recent DVT. Discussed with patient, he agrees.  He received 1 unit of PRBCs on 2/5 for hemoglobin  of 6.8, hemoglobin has been remaining around 7.9.  2. Acute blood loss anemia on top of Anemia of chronic disease due to renal failure. Transfused 1 unit packed red blood cells during hemodialysis 2/5.  Following CBC. Hg improved at 7.9. no plans for transfusion at present.  3. Leukocytosis - WBC trending down.  No fever and blood cultures no growth to date.    continue oral doxycycline for 14 days on discharge.  4. Scrotal abscess status post incision and drainage prior to admission. Pt still has hard indurated area in groin . Per urology no signs of identifiable abscess, no need for intervention at this time.  5. Right lower extremity DVT December 2017. Holding anticoagulation for now given significant bleeding at scrotal wound site.  6. End-stage renal disease. Management per nephrology.  HD per nephrology team   has been dialyzed on schedule, last HD session was on 2/7.  7. Diabetes mellitus type 2 with diabetic neuropathy.  BS well controlled.  8. Severe Constipation - Pt had several large BMs, continue laxatives.  9. Tachycardia - improving, sinus tach suspect secondary to infection, following. No signs of sepsis physiology at this time.  Procedures:  None   Consultations:  Nephrology  Urology  Discharge Instructions  Discharge Instructions    Diet - low sodium heart healthy    Complete by:  As directed    Increase activity slowly    Complete by:  As directed  Allergies as of 03/13/2016   No Known Allergies     Medication List    STOP taking these medications   clindamycin 300 MG capsule Commonly known as:  CLEOCIN   warfarin 5 MG tablet Commonly known as:  COUMADIN     TAKE these medications   colchicine 0.6 MG tablet 1/2 tab po 2 times /week What changed:  how much to take  how to take this  when to take this  reasons to take this  additional instructions   DAILY VITE PO Take 1 tablet by mouth daily.   doxycycline 100 MG tablet Commonly known  as:  VIBRA-TABS Take 1 tablet (100 mg total) by mouth every 12 (twelve) hours.   gabapentin 100 MG capsule Commonly known as:  NEURONTIN Take 100 mg by mouth daily.   sevelamer carbonate 800 MG tablet Commonly known as:  RENVELA Take 4 tablets (3,200 mg total) by mouth 3 (three) times daily with meals.   sevelamer carbonate 800 MG tablet Commonly known as:  RENVELA Take 1 tablet (800 mg total) by mouth 2 (two) times daily between meals as needed (With snacks).   sildenafil 20 MG tablet Commonly known as:  REVATIO Take 20 mg by mouth daily as needed. Dissolved under tongue (TROCHE) 60 minutes prior to sexual encounter   traMADol 50 MG tablet Commonly known as:  ULTRAM Take 1 tablet (50 mg total) by mouth daily as needed (with dressing changes).   ULORIC 80 MG Tabs Generic drug:  Febuxostat Take 80 mg by mouth daily.            Durable Medical Equipment        Start     Ordered   03/12/16 [phone]  For home use only DME 3 n 1  Once     03/12/16 0954   03/12/16 0955  For home use only DME Walker rolling  Once    Question:  Patient needs a walker to treat with the following condition  Answer:  Weakness   03/12/16 0954     No Known Allergies Follow-up Information    Kingsley Callander, FNP. Schedule an appointment as soon as possible for a visit in 2 week(s).   Specialty:  Family Medicine Contact information: [place] [address] [phone]            The results of significant diagnostics from this hospitalization (including imaging, microbiology, ancillary and laboratory) are listed below for reference.    Significant Diagnostic Studies: Ct Pelvis W Contrast  Result Date: 02/25/2016 CLINICAL DATA:  51 year old diabetic hypertensive male with abscess and testicles for 2 days. Initial encounter. EXAM: CT PELVIS WITH CONTRAST TECHNIQUE: Multidetector CT imaging of the pelvis was performed using the standard protocol following the bolus administration  of intravenous contrast. CONTRAST:  165mL ISOVUE-300 IOPAMIDOL (ISOVUE-300) INJECTION 61% COMPARISON:  11/01/2015 CT. FINDINGS: Urinary Tract: Contracted urinary bladder with circumferential thickening. Slight impression upon the bladder base by prostate gland. Bowel: No extra luminal bowel inflammatory process. Left colon diverticula. Vascular/Lymphatic: Calcification left common iliac artery. Reproductive: Abnormal appearance of the scrotum with diffuse inflammation. Within the scrotum are ill-defined structures which may represent combination of abscess and testicle. No gas however this may represent Fournier's gangrene. Inflammatory process extends to the undersurface of the penis. Inflammation extends along the inferior aspect of the inguinal canal greater on the right. Adjacent reactive adenopathy. Other:  None. Musculoskeletal: Schmorl's node deformity inferior endplate L4 and L5. IMPRESSION: Abnormal  appearance of the scrotum with diffuse inflammation. Within the scrotum are ill-defined structures which may represent combination of abscess and testicle. No gas however this may represent Fournier's gangrene. Inflammatory process extends to the undersurface of the penis. Inflammation extends along the inferior aspect of the inguinal canal greater on the right. Adjacent reactive adenopathy. These results were called by telephone at the time of interpretation on 02/25/2016 at 3:10 pm to Dr. Stark Jock, who verbally acknowledged these results. Electronically Signed   By: Genia Del M.D.   On: 02/25/2016 15:14   Dg Abd Portable 1v  Result Date: 03/06/2016 CLINICAL DATA:  Constipation . EXAM: PORTABLE ABDOMEN - 1 VIEW COMPARISON:  CT 02/25/2016.  CT 11/01/2015. FINDINGS: Soft tissue structures are unremarkable. Prominent amount of stool noted throughout the colon suggesting constipation. No prominent bowel distention. No free air. Degenerative changes thoracolumbar spine IMPRESSION: 1. Prominent amount of stool  noted throughout the colon consistent with constipation. 2.  Degenerative changes thoracolumbar spine. Electronically Signed   By: Marcello Moores  Register   On: 03/06/2016 09:18    Microbiology: Recent Results (from the past 240 hour(s))  Culture, blood (Routine X 2) w Reflex to ID Panel     Status: None   Collection Time: 03/08/16 10:43 AM  Result Value Ref Range Status   Specimen Description BLOOD BLOOD RIGHT HAND  Final   Special Requests BOTTLES DRAWN AEROBIC AND ANAEROBIC 5CC  Final   Culture NO GROWTH 5 DAYS  Final   Report Status 03/13/2016 FINAL  Final  Culture, blood (Routine X 2) w Reflex to ID Panel     Status: None   Collection Time: 03/08/16  4:26 PM  Result Value Ref Range Status   Specimen Description BLOOD BLOOD RIGHT HAND  Final   Special Requests BOTTLES DRAWN AEROBIC AND ANAEROBIC [facility]  Final   Culture NO GROWTH 5 DAYS  Final   Report Status 03/13/2016 FINAL  Final     Labs: Basic Metabolic Panel:  Recent Labs Lab 03/07/16 0544 03/08/16 0632 03/10/16 0616 03/12/16 0534 03/13/16 0604  NA 134* 134* 134* 133* 134*  K 4.6 4.1 4.5 3.9 4.3  CL 94* 96* 94* 93* 94*  CO2 25 28 24 25 29   GLUCOSE 96 91 98 104* 79  BUN 64* 37* 83* 56* 39*  CREATININE 12.39* 9.45* 15.70* 12.52* 10.21*  CALCIUM 9.3 8.8* 8.7* 8.9 9.4  PHOS 9.4* 7.0* 8.8* 10.1*  --    Liver Function Tests:  Recent Labs Lab 03/07/16 0544 03/08/16 0632 03/10/16 0616 03/12/16 0534  ALBUMIN 3.0* 2.7* 2.3* 2.4*   No results for input(s): LIPASE, AMYLASE in the last 168 hours. No results for input(s): AMMONIA in the last 168 hours. CBC:  Recent Labs Lab 03/09/16 0701 03/10/16 0616 03/11/16 0610 03/12/16 0531 03/13/16 0604  WBC 24.6* 22.8* 16.6* 13.4* 12.0*  NEUTROABS 20.7* 19.6* 13.1*  --   --   HGB 6.8* 6.8* 7.4* 7.9* 8.3*  HCT 20.6* 20.5* 22.6* 24.3* 26.1*  MCV 89.6 89.5 90.4 90.3 90.9  PLT 284 316 308 309 280   Cardiac Enzymes: No results for input(s): CKTOTAL, CKMB, CKMBINDEX, TROPONINI  in the last 168 hours. BNP: BNP (last 3 results) No results for input(s): BNP in the last 8760 hours.  ProBNP (last 3 results) No results for input(s): PROBNP in the last 8760 hours.  CBG:  Recent Labs Lab 03/12/16 1153 03/12/16 2011 03/13/16 0622 03/13/16 0742 03/13/16 1127  GLUCAP 73 105* 76 102* 90  SignedLelon Frohlich  [facility] Pager: [phone] 03/13/2016, 3:44 PM

## 2016-03-14 DIAGNOSIS — N186 End stage renal disease: Secondary | ICD-10-CM | POA: Diagnosis not present

## 2016-03-14 DIAGNOSIS — E11649 Type 2 diabetes mellitus with hypoglycemia without coma: Secondary | ICD-10-CM | POA: Diagnosis not present

## 2016-03-14 DIAGNOSIS — D509 Iron deficiency anemia, unspecified: Secondary | ICD-10-CM | POA: Diagnosis not present

## 2016-03-14 DIAGNOSIS — I120 Hypertensive chronic kidney disease with stage 5 chronic kidney disease or end stage renal disease: Secondary | ICD-10-CM | POA: Diagnosis not present

## 2016-03-14 DIAGNOSIS — Z86718 Personal history of other venous thrombosis and embolism: Secondary | ICD-10-CM | POA: Diagnosis not present

## 2016-03-14 DIAGNOSIS — E1122 Type 2 diabetes mellitus with diabetic chronic kidney disease: Secondary | ICD-10-CM | POA: Diagnosis not present

## 2016-03-14 DIAGNOSIS — Z992 Dependence on renal dialysis: Secondary | ICD-10-CM | POA: Diagnosis not present

## 2016-03-14 DIAGNOSIS — E162 Hypoglycemia, unspecified: Secondary | ICD-10-CM | POA: Diagnosis not present

## 2016-03-14 DIAGNOSIS — Z7901 Long term (current) use of anticoagulants: Secondary | ICD-10-CM | POA: Diagnosis not present

## 2016-03-14 DIAGNOSIS — D631 Anemia in chronic kidney disease: Secondary | ICD-10-CM | POA: Diagnosis not present

## 2016-03-14 DIAGNOSIS — N2581 Secondary hyperparathyroidism of renal origin: Secondary | ICD-10-CM | POA: Diagnosis not present

## 2016-03-14 DIAGNOSIS — Z794 Long term (current) use of insulin: Secondary | ICD-10-CM | POA: Diagnosis not present

## 2016-03-14 DIAGNOSIS — N492 Inflammatory disorders of scrotum: Secondary | ICD-10-CM | POA: Diagnosis not present

## 2016-03-14 DIAGNOSIS — K5900 Constipation, unspecified: Secondary | ICD-10-CM | POA: Diagnosis not present

## 2016-03-15 DIAGNOSIS — N492 Inflammatory disorders of scrotum: Secondary | ICD-10-CM | POA: Diagnosis not present

## 2016-03-15 DIAGNOSIS — N186 End stage renal disease: Secondary | ICD-10-CM | POA: Diagnosis not present

## 2016-03-15 DIAGNOSIS — I120 Hypertensive chronic kidney disease with stage 5 chronic kidney disease or end stage renal disease: Secondary | ICD-10-CM | POA: Diagnosis not present

## 2016-03-15 DIAGNOSIS — E1122 Type 2 diabetes mellitus with diabetic chronic kidney disease: Secondary | ICD-10-CM | POA: Diagnosis not present

## 2016-03-15 DIAGNOSIS — D631 Anemia in chronic kidney disease: Secondary | ICD-10-CM | POA: Diagnosis not present

## 2016-03-15 DIAGNOSIS — K5900 Constipation, unspecified: Secondary | ICD-10-CM | POA: Diagnosis not present

## 2016-03-17 ENCOUNTER — Ambulatory Visit (INDEPENDENT_AMBULATORY_CARE_PROVIDER_SITE_OTHER): Payer: Medicare Other | Admitting: *Deleted

## 2016-03-17 DIAGNOSIS — D631 Anemia in chronic kidney disease: Secondary | ICD-10-CM | POA: Diagnosis not present

## 2016-03-17 DIAGNOSIS — K5900 Constipation, unspecified: Secondary | ICD-10-CM | POA: Diagnosis not present

## 2016-03-17 DIAGNOSIS — E1122 Type 2 diabetes mellitus with diabetic chronic kidney disease: Secondary | ICD-10-CM | POA: Diagnosis not present

## 2016-03-17 DIAGNOSIS — Z5181 Encounter for therapeutic drug level monitoring: Secondary | ICD-10-CM | POA: Diagnosis not present

## 2016-03-17 DIAGNOSIS — D509 Iron deficiency anemia, unspecified: Secondary | ICD-10-CM | POA: Diagnosis not present

## 2016-03-17 DIAGNOSIS — Z992 Dependence on renal dialysis: Secondary | ICD-10-CM | POA: Diagnosis not present

## 2016-03-17 DIAGNOSIS — E162 Hypoglycemia, unspecified: Secondary | ICD-10-CM | POA: Diagnosis not present

## 2016-03-17 DIAGNOSIS — I824Y9 Acute embolism and thrombosis of unspecified deep veins of unspecified proximal lower extremity: Secondary | ICD-10-CM

## 2016-03-17 DIAGNOSIS — N2581 Secondary hyperparathyroidism of renal origin: Secondary | ICD-10-CM | POA: Diagnosis not present

## 2016-03-17 DIAGNOSIS — I120 Hypertensive chronic kidney disease with stage 5 chronic kidney disease or end stage renal disease: Secondary | ICD-10-CM | POA: Diagnosis not present

## 2016-03-17 DIAGNOSIS — N186 End stage renal disease: Secondary | ICD-10-CM | POA: Diagnosis not present

## 2016-03-17 DIAGNOSIS — N492 Inflammatory disorders of scrotum: Secondary | ICD-10-CM | POA: Diagnosis not present

## 2016-03-17 LAB — POCT INR: INR: 1.3

## 2016-03-18 DIAGNOSIS — N492 Inflammatory disorders of scrotum: Secondary | ICD-10-CM | POA: Diagnosis not present

## 2016-03-19 DIAGNOSIS — E162 Hypoglycemia, unspecified: Secondary | ICD-10-CM | POA: Diagnosis not present

## 2016-03-19 DIAGNOSIS — N186 End stage renal disease: Secondary | ICD-10-CM | POA: Diagnosis not present

## 2016-03-19 DIAGNOSIS — D631 Anemia in chronic kidney disease: Secondary | ICD-10-CM | POA: Diagnosis not present

## 2016-03-19 DIAGNOSIS — D509 Iron deficiency anemia, unspecified: Secondary | ICD-10-CM | POA: Diagnosis not present

## 2016-03-19 DIAGNOSIS — N2581 Secondary hyperparathyroidism of renal origin: Secondary | ICD-10-CM | POA: Diagnosis not present

## 2016-03-19 DIAGNOSIS — Z992 Dependence on renal dialysis: Secondary | ICD-10-CM | POA: Diagnosis not present

## 2016-03-20 DIAGNOSIS — D631 Anemia in chronic kidney disease: Secondary | ICD-10-CM | POA: Diagnosis not present

## 2016-03-20 DIAGNOSIS — N492 Inflammatory disorders of scrotum: Secondary | ICD-10-CM | POA: Diagnosis not present

## 2016-03-20 DIAGNOSIS — N186 End stage renal disease: Secondary | ICD-10-CM | POA: Diagnosis not present

## 2016-03-20 DIAGNOSIS — I120 Hypertensive chronic kidney disease with stage 5 chronic kidney disease or end stage renal disease: Secondary | ICD-10-CM | POA: Diagnosis not present

## 2016-03-20 DIAGNOSIS — K5900 Constipation, unspecified: Secondary | ICD-10-CM | POA: Diagnosis not present

## 2016-03-20 DIAGNOSIS — E1122 Type 2 diabetes mellitus with diabetic chronic kidney disease: Secondary | ICD-10-CM | POA: Diagnosis not present

## 2016-03-21 DIAGNOSIS — N186 End stage renal disease: Secondary | ICD-10-CM | POA: Diagnosis not present

## 2016-03-21 DIAGNOSIS — D509 Iron deficiency anemia, unspecified: Secondary | ICD-10-CM | POA: Diagnosis not present

## 2016-03-21 DIAGNOSIS — D631 Anemia in chronic kidney disease: Secondary | ICD-10-CM | POA: Diagnosis not present

## 2016-03-21 DIAGNOSIS — E162 Hypoglycemia, unspecified: Secondary | ICD-10-CM | POA: Diagnosis not present

## 2016-03-21 DIAGNOSIS — N2581 Secondary hyperparathyroidism of renal origin: Secondary | ICD-10-CM | POA: Diagnosis not present

## 2016-03-21 DIAGNOSIS — Z992 Dependence on renal dialysis: Secondary | ICD-10-CM | POA: Diagnosis not present

## 2016-03-24 DIAGNOSIS — E1122 Type 2 diabetes mellitus with diabetic chronic kidney disease: Secondary | ICD-10-CM | POA: Diagnosis not present

## 2016-03-24 DIAGNOSIS — N492 Inflammatory disorders of scrotum: Secondary | ICD-10-CM | POA: Diagnosis not present

## 2016-03-24 DIAGNOSIS — N186 End stage renal disease: Secondary | ICD-10-CM | POA: Diagnosis not present

## 2016-03-24 DIAGNOSIS — D509 Iron deficiency anemia, unspecified: Secondary | ICD-10-CM | POA: Diagnosis not present

## 2016-03-24 DIAGNOSIS — N2581 Secondary hyperparathyroidism of renal origin: Secondary | ICD-10-CM | POA: Diagnosis not present

## 2016-03-24 DIAGNOSIS — I120 Hypertensive chronic kidney disease with stage 5 chronic kidney disease or end stage renal disease: Secondary | ICD-10-CM | POA: Diagnosis not present

## 2016-03-24 DIAGNOSIS — Z992 Dependence on renal dialysis: Secondary | ICD-10-CM | POA: Diagnosis not present

## 2016-03-24 DIAGNOSIS — E162 Hypoglycemia, unspecified: Secondary | ICD-10-CM | POA: Diagnosis not present

## 2016-03-24 DIAGNOSIS — D631 Anemia in chronic kidney disease: Secondary | ICD-10-CM | POA: Diagnosis not present

## 2016-03-24 DIAGNOSIS — K5900 Constipation, unspecified: Secondary | ICD-10-CM | POA: Diagnosis not present

## 2016-03-26 DIAGNOSIS — N2581 Secondary hyperparathyroidism of renal origin: Secondary | ICD-10-CM | POA: Diagnosis not present

## 2016-03-26 DIAGNOSIS — D631 Anemia in chronic kidney disease: Secondary | ICD-10-CM | POA: Diagnosis not present

## 2016-03-26 DIAGNOSIS — E162 Hypoglycemia, unspecified: Secondary | ICD-10-CM | POA: Diagnosis not present

## 2016-03-26 DIAGNOSIS — N186 End stage renal disease: Secondary | ICD-10-CM | POA: Diagnosis not present

## 2016-03-26 DIAGNOSIS — D509 Iron deficiency anemia, unspecified: Secondary | ICD-10-CM | POA: Diagnosis not present

## 2016-03-26 DIAGNOSIS — Z992 Dependence on renal dialysis: Secondary | ICD-10-CM | POA: Diagnosis not present

## 2016-03-27 DIAGNOSIS — I120 Hypertensive chronic kidney disease with stage 5 chronic kidney disease or end stage renal disease: Secondary | ICD-10-CM | POA: Diagnosis not present

## 2016-03-27 DIAGNOSIS — E1122 Type 2 diabetes mellitus with diabetic chronic kidney disease: Secondary | ICD-10-CM | POA: Diagnosis not present

## 2016-03-27 DIAGNOSIS — D631 Anemia in chronic kidney disease: Secondary | ICD-10-CM | POA: Diagnosis not present

## 2016-03-27 DIAGNOSIS — N186 End stage renal disease: Secondary | ICD-10-CM | POA: Diagnosis not present

## 2016-03-27 DIAGNOSIS — N492 Inflammatory disorders of scrotum: Secondary | ICD-10-CM | POA: Diagnosis not present

## 2016-03-27 DIAGNOSIS — K5900 Constipation, unspecified: Secondary | ICD-10-CM | POA: Diagnosis not present

## 2016-03-28 DIAGNOSIS — Z992 Dependence on renal dialysis: Secondary | ICD-10-CM | POA: Diagnosis not present

## 2016-03-28 DIAGNOSIS — D509 Iron deficiency anemia, unspecified: Secondary | ICD-10-CM | POA: Diagnosis not present

## 2016-03-28 DIAGNOSIS — N186 End stage renal disease: Secondary | ICD-10-CM | POA: Diagnosis not present

## 2016-03-28 DIAGNOSIS — E162 Hypoglycemia, unspecified: Secondary | ICD-10-CM | POA: Diagnosis not present

## 2016-03-28 DIAGNOSIS — N2581 Secondary hyperparathyroidism of renal origin: Secondary | ICD-10-CM | POA: Diagnosis not present

## 2016-03-28 DIAGNOSIS — D631 Anemia in chronic kidney disease: Secondary | ICD-10-CM | POA: Diagnosis not present

## 2016-03-31 DIAGNOSIS — D509 Iron deficiency anemia, unspecified: Secondary | ICD-10-CM | POA: Diagnosis not present

## 2016-03-31 DIAGNOSIS — N2581 Secondary hyperparathyroidism of renal origin: Secondary | ICD-10-CM | POA: Diagnosis not present

## 2016-03-31 DIAGNOSIS — N186 End stage renal disease: Secondary | ICD-10-CM | POA: Diagnosis not present

## 2016-03-31 DIAGNOSIS — Z992 Dependence on renal dialysis: Secondary | ICD-10-CM | POA: Diagnosis not present

## 2016-03-31 DIAGNOSIS — E162 Hypoglycemia, unspecified: Secondary | ICD-10-CM | POA: Diagnosis not present

## 2016-03-31 DIAGNOSIS — D631 Anemia in chronic kidney disease: Secondary | ICD-10-CM | POA: Diagnosis not present

## 2016-04-01 DIAGNOSIS — N186 End stage renal disease: Secondary | ICD-10-CM | POA: Diagnosis not present

## 2016-04-01 DIAGNOSIS — K5900 Constipation, unspecified: Secondary | ICD-10-CM | POA: Diagnosis not present

## 2016-04-01 DIAGNOSIS — E1122 Type 2 diabetes mellitus with diabetic chronic kidney disease: Secondary | ICD-10-CM | POA: Diagnosis not present

## 2016-04-01 DIAGNOSIS — D631 Anemia in chronic kidney disease: Secondary | ICD-10-CM | POA: Diagnosis not present

## 2016-04-01 DIAGNOSIS — N492 Inflammatory disorders of scrotum: Secondary | ICD-10-CM | POA: Diagnosis not present

## 2016-04-01 DIAGNOSIS — I120 Hypertensive chronic kidney disease with stage 5 chronic kidney disease or end stage renal disease: Secondary | ICD-10-CM | POA: Diagnosis not present

## 2016-04-02 ENCOUNTER — Ambulatory Visit (INDEPENDENT_AMBULATORY_CARE_PROVIDER_SITE_OTHER): Payer: Medicare Other | Admitting: Urology

## 2016-04-02 DIAGNOSIS — N499 Inflammatory disorder of unspecified male genital organ: Secondary | ICD-10-CM

## 2016-04-02 DIAGNOSIS — N186 End stage renal disease: Secondary | ICD-10-CM | POA: Diagnosis not present

## 2016-04-02 DIAGNOSIS — Z992 Dependence on renal dialysis: Secondary | ICD-10-CM | POA: Diagnosis not present

## 2016-04-04 ENCOUNTER — Encounter: Payer: Self-pay | Admitting: Vascular Surgery

## 2016-04-04 DIAGNOSIS — D631 Anemia in chronic kidney disease: Secondary | ICD-10-CM | POA: Diagnosis not present

## 2016-04-04 DIAGNOSIS — D509 Iron deficiency anemia, unspecified: Secondary | ICD-10-CM | POA: Diagnosis not present

## 2016-04-04 DIAGNOSIS — N186 End stage renal disease: Secondary | ICD-10-CM | POA: Diagnosis not present

## 2016-04-04 DIAGNOSIS — N2581 Secondary hyperparathyroidism of renal origin: Secondary | ICD-10-CM | POA: Diagnosis not present

## 2016-04-04 DIAGNOSIS — Z992 Dependence on renal dialysis: Secondary | ICD-10-CM | POA: Diagnosis not present

## 2016-04-07 DIAGNOSIS — N2581 Secondary hyperparathyroidism of renal origin: Secondary | ICD-10-CM | POA: Diagnosis not present

## 2016-04-07 DIAGNOSIS — E1122 Type 2 diabetes mellitus with diabetic chronic kidney disease: Secondary | ICD-10-CM | POA: Diagnosis not present

## 2016-04-07 DIAGNOSIS — I120 Hypertensive chronic kidney disease with stage 5 chronic kidney disease or end stage renal disease: Secondary | ICD-10-CM | POA: Diagnosis not present

## 2016-04-07 DIAGNOSIS — D631 Anemia in chronic kidney disease: Secondary | ICD-10-CM | POA: Diagnosis not present

## 2016-04-07 DIAGNOSIS — N492 Inflammatory disorders of scrotum: Secondary | ICD-10-CM | POA: Diagnosis not present

## 2016-04-07 DIAGNOSIS — N186 End stage renal disease: Secondary | ICD-10-CM | POA: Diagnosis not present

## 2016-04-07 DIAGNOSIS — D509 Iron deficiency anemia, unspecified: Secondary | ICD-10-CM | POA: Diagnosis not present

## 2016-04-07 DIAGNOSIS — Z992 Dependence on renal dialysis: Secondary | ICD-10-CM | POA: Diagnosis not present

## 2016-04-07 DIAGNOSIS — K5900 Constipation, unspecified: Secondary | ICD-10-CM | POA: Diagnosis not present

## 2016-04-09 DIAGNOSIS — D631 Anemia in chronic kidney disease: Secondary | ICD-10-CM | POA: Diagnosis not present

## 2016-04-09 DIAGNOSIS — Z992 Dependence on renal dialysis: Secondary | ICD-10-CM | POA: Diagnosis not present

## 2016-04-09 DIAGNOSIS — D509 Iron deficiency anemia, unspecified: Secondary | ICD-10-CM | POA: Diagnosis not present

## 2016-04-09 DIAGNOSIS — N2581 Secondary hyperparathyroidism of renal origin: Secondary | ICD-10-CM | POA: Diagnosis not present

## 2016-04-09 DIAGNOSIS — N186 End stage renal disease: Secondary | ICD-10-CM | POA: Diagnosis not present

## 2016-04-11 DIAGNOSIS — N2581 Secondary hyperparathyroidism of renal origin: Secondary | ICD-10-CM | POA: Diagnosis not present

## 2016-04-11 DIAGNOSIS — D509 Iron deficiency anemia, unspecified: Secondary | ICD-10-CM | POA: Diagnosis not present

## 2016-04-11 DIAGNOSIS — D631 Anemia in chronic kidney disease: Secondary | ICD-10-CM | POA: Diagnosis not present

## 2016-04-11 DIAGNOSIS — Z992 Dependence on renal dialysis: Secondary | ICD-10-CM | POA: Diagnosis not present

## 2016-04-11 DIAGNOSIS — N186 End stage renal disease: Secondary | ICD-10-CM | POA: Diagnosis not present

## 2016-04-14 DIAGNOSIS — D509 Iron deficiency anemia, unspecified: Secondary | ICD-10-CM | POA: Diagnosis not present

## 2016-04-14 DIAGNOSIS — D631 Anemia in chronic kidney disease: Secondary | ICD-10-CM | POA: Diagnosis not present

## 2016-04-14 DIAGNOSIS — N2581 Secondary hyperparathyroidism of renal origin: Secondary | ICD-10-CM | POA: Diagnosis not present

## 2016-04-14 DIAGNOSIS — Z992 Dependence on renal dialysis: Secondary | ICD-10-CM | POA: Diagnosis not present

## 2016-04-14 DIAGNOSIS — N186 End stage renal disease: Secondary | ICD-10-CM | POA: Diagnosis not present

## 2016-04-15 DIAGNOSIS — N492 Inflammatory disorders of scrotum: Secondary | ICD-10-CM | POA: Diagnosis not present

## 2016-04-15 DIAGNOSIS — D631 Anemia in chronic kidney disease: Secondary | ICD-10-CM | POA: Diagnosis not present

## 2016-04-15 DIAGNOSIS — N186 End stage renal disease: Secondary | ICD-10-CM | POA: Diagnosis not present

## 2016-04-15 DIAGNOSIS — E1122 Type 2 diabetes mellitus with diabetic chronic kidney disease: Secondary | ICD-10-CM | POA: Diagnosis not present

## 2016-04-15 DIAGNOSIS — I120 Hypertensive chronic kidney disease with stage 5 chronic kidney disease or end stage renal disease: Secondary | ICD-10-CM | POA: Diagnosis not present

## 2016-04-15 DIAGNOSIS — K5900 Constipation, unspecified: Secondary | ICD-10-CM | POA: Diagnosis not present

## 2016-04-16 DIAGNOSIS — D509 Iron deficiency anemia, unspecified: Secondary | ICD-10-CM | POA: Diagnosis not present

## 2016-04-16 DIAGNOSIS — N186 End stage renal disease: Secondary | ICD-10-CM | POA: Diagnosis not present

## 2016-04-16 DIAGNOSIS — Z992 Dependence on renal dialysis: Secondary | ICD-10-CM | POA: Diagnosis not present

## 2016-04-16 DIAGNOSIS — D631 Anemia in chronic kidney disease: Secondary | ICD-10-CM | POA: Diagnosis not present

## 2016-04-16 DIAGNOSIS — N2581 Secondary hyperparathyroidism of renal origin: Secondary | ICD-10-CM | POA: Diagnosis not present

## 2016-04-17 ENCOUNTER — Ambulatory Visit (HOSPITAL_COMMUNITY): Admission: RE | Admit: 2016-04-17 | Payer: Medicare Other | Source: Ambulatory Visit

## 2016-04-17 ENCOUNTER — Ambulatory Visit: Payer: Medicare Other | Admitting: Vascular Surgery

## 2016-04-18 DIAGNOSIS — Z992 Dependence on renal dialysis: Secondary | ICD-10-CM | POA: Diagnosis not present

## 2016-04-18 DIAGNOSIS — N186 End stage renal disease: Secondary | ICD-10-CM | POA: Diagnosis not present

## 2016-04-18 DIAGNOSIS — D631 Anemia in chronic kidney disease: Secondary | ICD-10-CM | POA: Diagnosis not present

## 2016-04-18 DIAGNOSIS — N2581 Secondary hyperparathyroidism of renal origin: Secondary | ICD-10-CM | POA: Diagnosis not present

## 2016-04-18 DIAGNOSIS — D509 Iron deficiency anemia, unspecified: Secondary | ICD-10-CM | POA: Diagnosis not present

## 2016-04-21 DIAGNOSIS — N2581 Secondary hyperparathyroidism of renal origin: Secondary | ICD-10-CM | POA: Diagnosis not present

## 2016-04-21 DIAGNOSIS — D509 Iron deficiency anemia, unspecified: Secondary | ICD-10-CM | POA: Diagnosis not present

## 2016-04-21 DIAGNOSIS — N186 End stage renal disease: Secondary | ICD-10-CM | POA: Diagnosis not present

## 2016-04-21 DIAGNOSIS — D631 Anemia in chronic kidney disease: Secondary | ICD-10-CM | POA: Diagnosis not present

## 2016-04-21 DIAGNOSIS — Z992 Dependence on renal dialysis: Secondary | ICD-10-CM | POA: Diagnosis not present

## 2016-04-22 ENCOUNTER — Ambulatory Visit (HOSPITAL_COMMUNITY)
Admission: RE | Admit: 2016-04-22 | Discharge: 2016-04-22 | Disposition: A | Discharge status: Home / Self Care | Payer: Medicare Other | Source: Ambulatory Visit | Attending: Vascular Surgery | Admitting: Vascular Surgery

## 2016-04-22 DIAGNOSIS — I82401 Acute embolism and thrombosis of unspecified deep veins of right lower extremity: Secondary | ICD-10-CM | POA: Insufficient documentation

## 2016-04-23 ENCOUNTER — Encounter (HOSPITAL_COMMUNITY): Payer: Self-pay

## 2016-04-23 ENCOUNTER — Ambulatory Visit (INDEPENDENT_AMBULATORY_CARE_PROVIDER_SITE_OTHER): Payer: Medicare Other | Admitting: *Deleted

## 2016-04-23 DIAGNOSIS — D631 Anemia in chronic kidney disease: Secondary | ICD-10-CM | POA: Diagnosis not present

## 2016-04-23 DIAGNOSIS — Z992 Dependence on renal dialysis: Secondary | ICD-10-CM | POA: Diagnosis not present

## 2016-04-23 DIAGNOSIS — N492 Inflammatory disorders of scrotum: Secondary | ICD-10-CM | POA: Diagnosis not present

## 2016-04-23 DIAGNOSIS — K5900 Constipation, unspecified: Secondary | ICD-10-CM | POA: Diagnosis not present

## 2016-04-23 DIAGNOSIS — N2581 Secondary hyperparathyroidism of renal origin: Secondary | ICD-10-CM | POA: Diagnosis not present

## 2016-04-23 DIAGNOSIS — I120 Hypertensive chronic kidney disease with stage 5 chronic kidney disease or end stage renal disease: Secondary | ICD-10-CM | POA: Diagnosis not present

## 2016-04-23 DIAGNOSIS — D509 Iron deficiency anemia, unspecified: Secondary | ICD-10-CM | POA: Diagnosis not present

## 2016-04-23 DIAGNOSIS — Z5181 Encounter for therapeutic drug level monitoring: Secondary | ICD-10-CM

## 2016-04-23 DIAGNOSIS — I824Y9 Acute embolism and thrombosis of unspecified deep veins of unspecified proximal lower extremity: Secondary | ICD-10-CM

## 2016-04-23 DIAGNOSIS — N186 End stage renal disease: Secondary | ICD-10-CM | POA: Diagnosis not present

## 2016-04-23 DIAGNOSIS — E1122 Type 2 diabetes mellitus with diabetic chronic kidney disease: Secondary | ICD-10-CM | POA: Diagnosis not present

## 2016-04-23 LAB — POCT INR: INR: 1.1

## 2016-04-24 ENCOUNTER — Encounter: Payer: Self-pay | Admitting: Vascular Surgery

## 2016-04-24 ENCOUNTER — Other Ambulatory Visit: Payer: Self-pay

## 2016-04-24 ENCOUNTER — Ambulatory Visit (INDEPENDENT_AMBULATORY_CARE_PROVIDER_SITE_OTHER): Payer: Medicare Other | Admitting: Vascular Surgery

## 2016-04-24 VITALS — BP 100/80 | HR 107 | Temp 97.5°F | Resp 20 | Ht 71.0 in | Wt 252.0 lb

## 2016-04-24 DIAGNOSIS — Z992 Dependence on renal dialysis: Secondary | ICD-10-CM

## 2016-04-24 DIAGNOSIS — N186 End stage renal disease: Secondary | ICD-10-CM

## 2016-04-24 NOTE — Progress Notes (Signed)
Patient is a 51 year old male who returns for follow-up today. He was recently scheduled to have a right upper arm graft placed several weeks ago. However that time he had an acute DVT. He was on Coumadin for about a month. He subsequently stopped this after bleeding from an abscess in his groin. He currently is dialyzing via a catheter. His dialysis today is Monday Wednesday Friday. He previously had a left arm AV graft which failed after about a year.  Past Medical History:  Diagnosis Date  . Anemia   . Arthritis   . BPH (benign prostatic hyperplasia)   . Chronic back pain   . Difficulty walking   . DVT (deep venous thrombosis) ([place])    Right popliteal DVT December 2017  . End-stage renal disease on hemodialysis ([place])    Dr. Lowanda Foster  . Erectile dysfunction   . Essential hypertension   . Gout   . Neuropathy, diabetic ([place])   . Type 2 diabetes mellitus ([place])   . Venous (peripheral) insufficiency      Review of systems: He denies shortness of breath. He denies chest pain. He states that the infection in his groin has now resolved.   Data: Patient had duplex ultrasound yesterday which showed resolution of the top popliteal DVT in his right leg. Prior vein mapping reviewed marginal veins bilateral upper extremities normal arterial anatomy right arm  Physical exam:  Vitals:   04/24/16 1044  BP: 100/80  Pulse: (!) 107  Resp: 20  Temp: 97.5 F (36.4 C)  TempSrc: Oral  SpO2: 96%  Weight: 252 lb (114.3 kg)  Height: 5\' 11"  (1.803 m)    Extremities: Absent radial ulnar pulse right arm no obvious surface veins  Chest: Clear to auscultation bilaterally  Cardiac: Regular rate and rhythm  Assessment: #1 patient with end-stage renal disease needs long-term hemodialysis access. The patient is now off his Coumadin. His DVT is resolved. Plan will be for right arm AV graft on 05/06/2016. Risks benefits possible complications and procedure details were discussed the patient today  including but not limited to bleeding infection graft thrombosis ischemic steal patient wishes to proceed.  Chronic medical problems of hypertension diabetes and neuropathy are all currently stable.  Ruta Hinds, MD [facility] Office: [phone] Pager: [phone]

## 2016-04-25 DIAGNOSIS — D509 Iron deficiency anemia, unspecified: Secondary | ICD-10-CM | POA: Diagnosis not present

## 2016-04-25 DIAGNOSIS — Z992 Dependence on renal dialysis: Secondary | ICD-10-CM | POA: Diagnosis not present

## 2016-04-25 DIAGNOSIS — D631 Anemia in chronic kidney disease: Secondary | ICD-10-CM | POA: Diagnosis not present

## 2016-04-25 DIAGNOSIS — N2581 Secondary hyperparathyroidism of renal origin: Secondary | ICD-10-CM | POA: Diagnosis not present

## 2016-04-25 DIAGNOSIS — N186 End stage renal disease: Secondary | ICD-10-CM | POA: Diagnosis not present

## 2016-04-28 ENCOUNTER — Encounter: Payer: Self-pay | Admitting: Family Medicine

## 2016-04-28 ENCOUNTER — Ambulatory Visit (INDEPENDENT_AMBULATORY_CARE_PROVIDER_SITE_OTHER): Payer: Medicare Other | Admitting: Family Medicine

## 2016-04-28 VITALS — BP 97/56 | HR 111 | Temp 97.8°F | Resp 16 | Ht 71.0 in | Wt 255.0 lb

## 2016-04-28 DIAGNOSIS — I10 Essential (primary) hypertension: Secondary | ICD-10-CM | POA: Diagnosis not present

## 2016-04-28 DIAGNOSIS — M109 Gout, unspecified: Secondary | ICD-10-CM | POA: Diagnosis not present

## 2016-04-28 DIAGNOSIS — N186 End stage renal disease: Secondary | ICD-10-CM | POA: Diagnosis not present

## 2016-04-28 DIAGNOSIS — Z992 Dependence on renal dialysis: Secondary | ICD-10-CM

## 2016-04-28 DIAGNOSIS — T148XXA Other injury of unspecified body region, initial encounter: Secondary | ICD-10-CM

## 2016-04-28 DIAGNOSIS — E118 Type 2 diabetes mellitus with unspecified complications: Secondary | ICD-10-CM

## 2016-04-28 DIAGNOSIS — D509 Iron deficiency anemia, unspecified: Secondary | ICD-10-CM | POA: Diagnosis not present

## 2016-04-28 DIAGNOSIS — N2581 Secondary hyperparathyroidism of renal origin: Secondary | ICD-10-CM | POA: Diagnosis not present

## 2016-04-28 DIAGNOSIS — D631 Anemia in chronic kidney disease: Secondary | ICD-10-CM | POA: Diagnosis not present

## 2016-04-28 DIAGNOSIS — M20039 Swan-neck deformity of unspecified finger(s): Secondary | ICD-10-CM | POA: Diagnosis not present

## 2016-04-28 NOTE — Progress Notes (Signed)
Subjective:    Patient ID: Jack Irwin, male    DOB: 1965/09/27, 51 y.o.   MRN: [id number]  HPI [name], a 51 year old male with a history of end stage renal disease, type 2 diabetes mellitus, gouty arthritis, and hypertension presents accompanied by wife to establish care.  Patient has not had a primary provider and has been using Dr. Carlota Raspberry, nephrologist for all primary needs. [name] has end stage renal disease with dialysis on Monday, Wednesday, and Friday at [facility]. He is scheduled to have a right upper arm graft placed    [name] reports a history of gouty arthritis and is requesting a referral rheumatology. Patient has chronic pain primarily to lower extremities. He previously had a left arm AV graft that previously failed. Pain is located in multiple joints, is described as aching, and is intermittent .  Associated symptoms include: decreased range of motion, tenderness and warmth.    [name] has a history of type 2 diabetes mellitus. His last hemoglobin a1C was 5.4 once week ago. He has been controlling blood sugar with diet.  Patient denies foot ulcerations, increase appetite, nausea, polydipsia, polyuria, visual disturbances, vomitting and weight loss Patient also has a history of hypertension. [name] He is not exercising and is adherent to low salt diet.  Blood pressure is well controlled at home. He is currently not on antihypertensive medication.  Cardiovascular risk factors include: diabetes mellitus, obesity (BMI >= 30 kg/m2) and sedentary lifestyle.  Past Medical History:  Diagnosis Date  . Anemia   . Arthritis   . BPH (benign prostatic hyperplasia)   . Chronic back pain   . Difficulty walking   . DVT (deep venous thrombosis) ([place])    Right popliteal DVT December 2017  . End-stage renal disease on hemodialysis ([name])    Dr. Lowanda Foster  . Erectile dysfunction   . Essential hypertension   . Gout   . Neuropathy,  diabetic ([name])   . Type 2 diabetes mellitus ([name])   . Venous (peripheral) insufficiency    Social History   Social History  . Marital status: Married    Spouse name: Jack Irwin  . Number of children: Jack Irwin  . Years of education: Jack Irwin   Occupational History  . Not on file.   Social History Main Topics  . Smoking status: Never Smoker  . Smokeless tobacco: Never Used  . Alcohol use No  . Drug use: No  . Sexual activity: Not on file   Other Topics Concern  . Not on file   Social History Narrative  . No narrative on file   Immunization History  Administered Date(s) Administered  . PPD Test 02/17/2014   Allergies  Allergen Reactions  . Doxycycline Itching   Review of Systems  Constitutional: Negative.  Negative for unexpected weight change.  HENT: Negative.   Eyes: Negative.  Negative for visual disturbance.  Respiratory: Negative.   Cardiovascular: Negative.  Negative for chest pain, palpitations and leg swelling.  Gastrointestinal: Negative.   Genitourinary: Positive for difficulty urinating.       End stage renal failure with dialysis  Musculoskeletal: Positive for arthralgias.  Skin: Negative.   Allergic/Immunologic: Negative.   Neurological: Positive for numbness.  Hematological: Negative.   Psychiatric/Behavioral: Negative.        Objective:   Physical Exam  Constitutional: He is oriented to person, place, and time.  HENT:  Head: Normocephalic and atraumatic.  Right Ear: External ear normal.  Left Ear: External ear normal.  Nose: Nose normal.  Mouth/Throat: Oropharynx is clear and moist.  Eyes: Conjunctivae and EOM are normal. Pupils are equal, round, and reactive to light.  Neck: Normal range of motion and full passive range of motion without pain. Neck supple.  Pulmonary/Chest: Effort normal and breath sounds normal.  Abdominal: Soft. Bowel sounds are normal.  Musculoskeletal:       Right wrist: He exhibits decreased range of motion and tenderness.        Left wrist: He exhibits decreased range of motion and tenderness.  Swan neck deformities to phalanges  Neurological: He is alert and oriented to person, place, and time. He has normal reflexes.  Skin: Skin is warm and dry.  Left chest port.    Generalized dryness and flaking  Psychiatric: He has a normal mood and affect. His behavior is normal. Judgment and thought content normal.      BP (!) 97/56 (BP Location: Left Arm, Patient Position: Sitting, Cuff Size: Large)   Pulse (!) 111   Temp 97.8 F (36.6 C) (Oral)   Resp 16   Ht 5\' 11"  (1.803 m)   Wt 255 lb (115.7 kg)   BMI 35.57 kg/m  Assessment & Plan:  1. Arthritis, gouty - Ambulatory referral to Rheumatology - Uric Acid  2. Swan-neck deformity of finger, unspecified laterality - Ambulatory referral to Rheumatology  3. Essential hypertension Blood pressure is at goal. Patient is not currently on anti-hypertension medications - Ambulatory referral to Ophthalmology  4. Type 2 diabetes mellitus with complication, unspecified long term insulin use status ([place]) - Ambulatory referral to Podiatry - Ambulatory referral to Ophthalmology  5. ESRD on dialysis [facility]  Continue dialysis on M, W, F as previously scheduled.  6. Skin excoriation Continue triamcinolone cream. Discussed using vaseline as an emollient.      Preventative maintenance:   Prostate exam in 3 months.  Will review [facility] for vaccinations   Jack Pounds  MSN, FNP-C [facility] [address] [place], [place] [address] [phone]

## 2016-04-28 NOTE — Patient Instructions (Signed)
Skin excoriation: Refrain from scratching. Wash with Dial Antibacterial soap Diabetes: Continue carbohydrate modified diet. Sent referrals to podiatry and opthalmology.  Hypertension: At goal without antihypertensive medication  Swan Neck Deformity and gouty arthritis: Sent referral to rheumatology   Follow up in 3 months for PSA and Prostate examination.

## 2016-04-29 DIAGNOSIS — N186 End stage renal disease: Secondary | ICD-10-CM | POA: Diagnosis not present

## 2016-04-29 DIAGNOSIS — K5900 Constipation, unspecified: Secondary | ICD-10-CM | POA: Diagnosis not present

## 2016-04-29 DIAGNOSIS — I120 Hypertensive chronic kidney disease with stage 5 chronic kidney disease or end stage renal disease: Secondary | ICD-10-CM | POA: Diagnosis not present

## 2016-04-29 DIAGNOSIS — N492 Inflammatory disorders of scrotum: Secondary | ICD-10-CM | POA: Diagnosis not present

## 2016-04-29 DIAGNOSIS — E1122 Type 2 diabetes mellitus with diabetic chronic kidney disease: Secondary | ICD-10-CM | POA: Diagnosis not present

## 2016-04-29 DIAGNOSIS — D631 Anemia in chronic kidney disease: Secondary | ICD-10-CM | POA: Diagnosis not present

## 2016-04-29 LAB — URIC ACID

## 2016-04-30 DIAGNOSIS — Z992 Dependence on renal dialysis: Secondary | ICD-10-CM | POA: Diagnosis not present

## 2016-04-30 DIAGNOSIS — D509 Iron deficiency anemia, unspecified: Secondary | ICD-10-CM | POA: Diagnosis not present

## 2016-04-30 DIAGNOSIS — N186 End stage renal disease: Secondary | ICD-10-CM | POA: Diagnosis not present

## 2016-04-30 DIAGNOSIS — N2581 Secondary hyperparathyroidism of renal origin: Secondary | ICD-10-CM | POA: Diagnosis not present

## 2016-04-30 DIAGNOSIS — D631 Anemia in chronic kidney disease: Secondary | ICD-10-CM | POA: Diagnosis not present

## 2016-05-01 DIAGNOSIS — N186 End stage renal disease: Secondary | ICD-10-CM | POA: Diagnosis not present

## 2016-05-01 DIAGNOSIS — D631 Anemia in chronic kidney disease: Secondary | ICD-10-CM | POA: Diagnosis not present

## 2016-05-01 DIAGNOSIS — Z992 Dependence on renal dialysis: Secondary | ICD-10-CM | POA: Diagnosis not present

## 2016-05-01 DIAGNOSIS — N2581 Secondary hyperparathyroidism of renal origin: Secondary | ICD-10-CM | POA: Diagnosis not present

## 2016-05-01 DIAGNOSIS — D509 Iron deficiency anemia, unspecified: Secondary | ICD-10-CM | POA: Diagnosis not present

## 2016-05-02 ENCOUNTER — Encounter (HOSPITAL_COMMUNITY): Payer: Self-pay | Admitting: *Deleted

## 2016-05-03 DIAGNOSIS — N186 End stage renal disease: Secondary | ICD-10-CM | POA: Diagnosis not present

## 2016-05-03 DIAGNOSIS — Z992 Dependence on renal dialysis: Secondary | ICD-10-CM | POA: Diagnosis not present

## 2016-05-05 DIAGNOSIS — D631 Anemia in chronic kidney disease: Secondary | ICD-10-CM | POA: Diagnosis not present

## 2016-05-05 DIAGNOSIS — Z992 Dependence on renal dialysis: Secondary | ICD-10-CM | POA: Diagnosis not present

## 2016-05-05 DIAGNOSIS — N186 End stage renal disease: Secondary | ICD-10-CM | POA: Diagnosis not present

## 2016-05-05 DIAGNOSIS — T82898A Other specified complication of vascular prosthetic devices, implants and grafts, initial encounter: Secondary | ICD-10-CM | POA: Diagnosis not present

## 2016-05-05 DIAGNOSIS — N2581 Secondary hyperparathyroidism of renal origin: Secondary | ICD-10-CM | POA: Diagnosis not present

## 2016-05-05 DIAGNOSIS — D509 Iron deficiency anemia, unspecified: Secondary | ICD-10-CM | POA: Diagnosis not present

## 2016-05-06 ENCOUNTER — Ambulatory Visit (HOSPITAL_COMMUNITY): Payer: Medicare Other | Admitting: Emergency Medicine

## 2016-05-06 ENCOUNTER — Telehealth: Payer: Self-pay | Admitting: Vascular Surgery

## 2016-05-06 ENCOUNTER — Encounter (HOSPITAL_COMMUNITY): Payer: Self-pay | Admitting: Urology

## 2016-05-06 ENCOUNTER — Encounter (HOSPITAL_COMMUNITY)
Admission: RE | Disposition: A | Discharge status: Home / Self Care | Payer: Self-pay | Source: Ambulatory Visit | Attending: Vascular Surgery

## 2016-05-06 ENCOUNTER — Ambulatory Visit (HOSPITAL_COMMUNITY)
Admission: RE | Admit: 2016-05-06 | Discharge: 2016-05-06 | Disposition: A | Discharge status: Home / Self Care | Payer: Medicare Other | Source: Ambulatory Visit | Attending: Vascular Surgery | Admitting: Vascular Surgery

## 2016-05-06 DIAGNOSIS — Z7984 Long term (current) use of oral hypoglycemic drugs: Secondary | ICD-10-CM | POA: Insufficient documentation

## 2016-05-06 DIAGNOSIS — Z86718 Personal history of other venous thrombosis and embolism: Secondary | ICD-10-CM | POA: Insufficient documentation

## 2016-05-06 DIAGNOSIS — Z6835 Body mass index (BMI) 35.0-35.9, adult: Secondary | ICD-10-CM | POA: Diagnosis not present

## 2016-05-06 DIAGNOSIS — D631 Anemia in chronic kidney disease: Secondary | ICD-10-CM | POA: Diagnosis not present

## 2016-05-06 DIAGNOSIS — Z992 Dependence on renal dialysis: Secondary | ICD-10-CM | POA: Diagnosis not present

## 2016-05-06 DIAGNOSIS — N186 End stage renal disease: Secondary | ICD-10-CM | POA: Insufficient documentation

## 2016-05-06 DIAGNOSIS — M109 Gout, unspecified: Secondary | ICD-10-CM | POA: Insufficient documentation

## 2016-05-06 DIAGNOSIS — I120 Hypertensive chronic kidney disease with stage 5 chronic kidney disease or end stage renal disease: Secondary | ICD-10-CM | POA: Diagnosis not present

## 2016-05-06 DIAGNOSIS — E1122 Type 2 diabetes mellitus with diabetic chronic kidney disease: Secondary | ICD-10-CM | POA: Diagnosis not present

## 2016-05-06 DIAGNOSIS — E1151 Type 2 diabetes mellitus with diabetic peripheral angiopathy without gangrene: Secondary | ICD-10-CM | POA: Insufficient documentation

## 2016-05-06 DIAGNOSIS — E119 Type 2 diabetes mellitus without complications: Secondary | ICD-10-CM | POA: Diagnosis not present

## 2016-05-06 HISTORY — PX: AV FISTULA PLACEMENT: SHX1204

## 2016-05-06 LAB — GLUCOSE, CAPILLARY
GLUCOSE-CAPILLARY: 66 mg/dL (ref 65–99)
Glucose-Capillary: 109 mg/dL — ABNORMAL HIGH (ref 65–99)

## 2016-05-06 LAB — POCT I-STAT 4, (NA,K, GLUC, HGB,HCT)
GLUCOSE: 92 mg/dL (ref 65–99)
HCT: 33 % — ABNORMAL LOW (ref 39.0–52.0)
HEMOGLOBIN: 11.2 g/dL — AB (ref 13.0–17.0)
POTASSIUM: 4.2 mmol/L (ref 3.5–5.1)
SODIUM: 139 mmol/L (ref 135–145)

## 2016-05-06 SURGERY — INSERTION OF ARTERIOVENOUS (AV) GORE-TEX GRAFT ARM
Anesthesia: Monitor Anesthesia Care | Site: Arm Upper | Laterality: Right

## 2016-05-06 MED ORDER — PHENYLEPHRINE HCL 10 MG/ML IJ SOLN
INTRAMUSCULAR | Status: DC | PRN
  Administered 2016-05-06 (×2): 80 ug via INTRAVENOUS

## 2016-05-06 MED ORDER — HEPARIN SODIUM (PORCINE) 1000 UNIT/ML IJ SOLN
INTRAMUSCULAR | Status: DC | PRN
  Administered 2016-05-06: 7 mL via INTRAVENOUS

## 2016-05-06 MED ORDER — PROPOFOL 10 MG/ML IV BOLUS
INTRAVENOUS | Status: AC
  Filled 2016-05-06: qty 20

## 2016-05-06 MED ORDER — PROMETHAZINE HCL 25 MG/ML IJ SOLN: 6.2500 mg | INTRAMUSCULAR | Status: DC | PRN

## 2016-05-06 MED ORDER — FENTANYL CITRATE (PF) 100 MCG/2ML IJ SOLN
INTRAMUSCULAR | Status: AC
  Filled 2016-05-06: qty 2

## 2016-05-06 MED ORDER — LIDOCAINE HCL (PF) 1 % IJ SOLN
INTRAMUSCULAR | Status: DC | PRN
  Administered 2016-05-06: 9 mL
  Administered 2016-05-06: 30 mL

## 2016-05-06 MED ORDER — LIDOCAINE HCL (PF) 1 % IJ SOLN
INTRAMUSCULAR | Status: AC
  Filled 2016-05-06: qty 30

## 2016-05-06 MED ORDER — MIDAZOLAM HCL 2 MG/2ML IJ SOLN
INTRAMUSCULAR | Status: AC
  Filled 2016-05-06: qty 2

## 2016-05-06 MED ORDER — FENTANYL CITRATE (PF) 100 MCG/2ML IJ SOLN
25.0000 ug | INTRAMUSCULAR | Status: DC | PRN
  Administered 2016-05-06: 25 ug via INTRAVENOUS

## 2016-05-06 MED ORDER — COLCHICINE 0.6 MG PO TABS: 0.3000 mg | ORAL_TABLET | Freq: Every day | ORAL | Status: DC | PRN

## 2016-05-06 MED ORDER — ONDANSETRON HCL 4 MG/2ML IJ SOLN
INTRAMUSCULAR | Status: AC
  Filled 2016-05-06: qty 2

## 2016-05-06 MED ORDER — DEXTROSE 5 % IV SOLN
1.5000 g | INTRAVENOUS | Status: AC
  Administered 2016-05-06: 1.5 g via INTRAVENOUS
  Filled 2016-05-06: qty 1.5

## 2016-05-06 MED ORDER — PHENYLEPHRINE HCL 10 MG/ML IJ SOLN
INTRAVENOUS | Status: DC | PRN
  Administered 2016-05-06: 20 ug/min via INTRAVENOUS

## 2016-05-06 MED ORDER — FENTANYL CITRATE (PF) 250 MCG/5ML IJ SOLN
INTRAMUSCULAR | Status: AC
  Filled 2016-05-06: qty 5

## 2016-05-06 MED ORDER — SODIUM CHLORIDE 0.9 % IV SOLN
INTRAVENOUS | Status: DC
  Administered 2016-05-06: 07:00:00 via INTRAVENOUS

## 2016-05-06 MED ORDER — EPHEDRINE 5 MG/ML INJ
INTRAVENOUS | Status: AC
  Filled 2016-05-06: qty 10

## 2016-05-06 MED ORDER — MIDAZOLAM HCL 5 MG/5ML IJ SOLN
INTRAMUSCULAR | Status: DC | PRN
  Administered 2016-05-06: 2 mg via INTRAVENOUS

## 2016-05-06 MED ORDER — OXYCODONE-ACETAMINOPHEN 5-325 MG PO TABS: 1.0000 | ORAL_TABLET | Freq: Four times a day (QID) | ORAL | 0 refills | Status: DC | PRN

## 2016-05-06 MED ORDER — SODIUM CHLORIDE 0.9 % IV SOLN
INTRAVENOUS | Status: DC | PRN
  Administered 2016-05-06: 500 mL

## 2016-05-06 MED ORDER — SUCCINYLCHOLINE CHLORIDE 200 MG/10ML IV SOSY
PREFILLED_SYRINGE | INTRAVENOUS | Status: AC
  Filled 2016-05-06: qty 10

## 2016-05-06 MED ORDER — CHLORHEXIDINE GLUCONATE CLOTH 2 % EX PADS: 6.0000 | MEDICATED_PAD | Freq: Once | CUTANEOUS | Status: DC

## 2016-05-06 MED ORDER — 0.9 % SODIUM CHLORIDE (POUR BTL) OPTIME
TOPICAL | Status: DC | PRN
  Administered 2016-05-06: 1000 mL

## 2016-05-06 MED ORDER — PROPOFOL 500 MG/50ML IV EMUL
INTRAVENOUS | Status: DC | PRN
  Administered 2016-05-06: 75 ug/kg/min via INTRAVENOUS

## 2016-05-06 SURGICAL SUPPLY — 37 items
ADH SKN CLS APL DERMABOND .7 (GAUZE/BANDAGES/DRESSINGS) ×1
AGENT HMST SPONGE THK3/8 (HEMOSTASIS)
ARMBAND PINK RESTRICT EXTREMIT (MISCELLANEOUS) ×6 IMPLANT
CANISTER SUCT 3000ML PPV (MISCELLANEOUS) ×3 IMPLANT
CANNULA VESSEL 3MM 2 BLNT TIP (CANNULA) IMPLANT
CLIP TI MEDIUM 6 (CLIP) ×3 IMPLANT
CLIP TI WIDE RED SMALL 6 (CLIP) ×3 IMPLANT
DECANTER SPIKE VIAL GLASS SM (MISCELLANEOUS) ×3 IMPLANT
DERMABOND ADVANCED (GAUZE/BANDAGES/DRESSINGS) ×2
DERMABOND ADVANCED .7 DNX12 (GAUZE/BANDAGES/DRESSINGS) ×1 IMPLANT
ELECT REM PT RETURN 9FT ADLT (ELECTROSURGICAL) ×3
ELECTRODE REM PT RTRN 9FT ADLT (ELECTROSURGICAL) ×1 IMPLANT
GLOVE BIO SURGEON STRL SZ 6.5 (GLOVE) ×2 IMPLANT
GLOVE BIO SURGEON STRL SZ7.5 (GLOVE) ×3 IMPLANT
GLOVE BIO SURGEONS STRL SZ 6.5 (GLOVE) ×2
GLOVE BIOGEL PI IND STRL 6.5 (GLOVE) IMPLANT
GLOVE BIOGEL PI IND STRL 7.0 (GLOVE) IMPLANT
GLOVE BIOGEL PI INDICATOR 6.5 (GLOVE) ×12
GLOVE BIOGEL PI INDICATOR 7.0 (GLOVE) ×2
GLOVE SKINSENSE NS SZ7.0 (GLOVE) ×2
GLOVE SKINSENSE STRL SZ6.0 (GLOVE) ×2 IMPLANT
GLOVE SKINSENSE STRL SZ7.0 (GLOVE) IMPLANT
GOWN STRL REUS W/ TWL LRG LVL3 (GOWN DISPOSABLE) ×3 IMPLANT
GOWN STRL REUS W/TWL LRG LVL3 (GOWN DISPOSABLE) ×18
GRAFT GORETEX STRT 6X50 (Vascular Products) ×2 IMPLANT
HEMOSTAT SPONGE AVITENE ULTRA (HEMOSTASIS) IMPLANT
KIT BASIN OR (CUSTOM PROCEDURE TRAY) ×3 IMPLANT
KIT ROOM TURNOVER OR (KITS) ×3 IMPLANT
NS IRRIG 1000ML POUR BTL (IV SOLUTION) ×3 IMPLANT
PACK CV ACCESS (CUSTOM PROCEDURE TRAY) ×3 IMPLANT
PAD ARMBOARD 7.5X6 YLW CONV (MISCELLANEOUS) ×6 IMPLANT
SUT PROLENE 6 0 CC (SUTURE) ×8 IMPLANT
SUT VIC AB 3-0 SH 27 (SUTURE) ×6
SUT VIC AB 3-0 SH 27X BRD (SUTURE) ×2 IMPLANT
SUT VICRYL 4-0 PS2 18IN ABS (SUTURE) ×6 IMPLANT
UNDERPAD 30X30 (UNDERPADS AND DIAPERS) ×3 IMPLANT
WATER STERILE IRR 1000ML POUR (IV SOLUTION) ×3 IMPLANT

## 2016-05-06 NOTE — Progress Notes (Signed)
Dialysis access record faxed to [facility]

## 2016-05-06 NOTE — Anesthesia Preprocedure Evaluation (Addendum)
Anesthesia Evaluation  Patient identified by MRN, date of birth, ID band Patient awake    Reviewed: Allergy & Precautions, NPO status , Patient's Chart, lab work & pertinent test results  Airway Mallampati: II  TM Distance: >3 FB Neck ROM: Full    Dental  (+) Teeth Intact, Dental Advisory Given   Pulmonary neg pulmonary ROS,    Pulmonary exam normal breath sounds clear to auscultation       Cardiovascular hypertension, + Peripheral Vascular Disease and + DVT  Normal cardiovascular exam+ dysrhythmias  Rhythm:Regular Rate:Normal     Neuro/Psych negative neurological ROS  negative psych ROS   GI/Hepatic negative GI ROS, Neg liver ROS,   Endo/Other  diabetes, Type 2, Oral Hypoglycemic AgentsMorbid obesityObesity   Renal/GU Dialysis and ESRFRenal disease     Musculoskeletal  (+) Arthritis , Osteoarthritis,    Abdominal   Peds  Hematology negative hematology ROS (+) anemia ,   Anesthesia Other Findings Day of surgery medications reviewed with the patient.  Reproductive/Obstetrics                            Anesthesia Physical Anesthesia Plan  ASA: III  Anesthesia Plan: General   Post-op Pain Management:    Induction: Intravenous  Airway Management Planned: LMA  Additional Equipment:   Intra-op Plan:   Post-operative Plan: Extubation in OR  Informed Consent: I have reviewed the patients History and Physical, chart, labs and discussed the procedure including the risks, benefits and alternatives for the proposed anesthesia with the patient or authorized representative who has indicated his/her understanding and acceptance.   Dental advisory given  Plan Discussed with: CRNA, Anesthesiologist and Surgeon  Anesthesia Plan Comments: (Risks/benefits of general anesthesia discussed with patient including risk of damage to teeth, lips, gum, and tongue, nausea/vomiting, allergic reactions to  medications, and the possibility of heart attack, stroke and death.  All patient questions answered.  Patient wishes to proceed.)       Anesthesia Quick Evaluation

## 2016-05-06 NOTE — Anesthesia Postprocedure Evaluation (Signed)
Anesthesia Post Note  Patient: Jack Irwin  Procedure(s) Performed: Procedure(s) (LRB): INSERTION OF ARTERIOVENOUS (AV) GORE-TEX GRAFT  Right ARM (Right)  Patient location during evaluation: PACU Anesthesia Type: MAC Level of consciousness: awake and alert Pain management: pain level controlled Vital Signs Assessment: post-procedure vital signs reviewed and stable Respiratory status: spontaneous breathing, nonlabored ventilation, respiratory function stable and patient connected to nasal cannula oxygen Cardiovascular status: stable and blood pressure returned to baseline Anesthetic complications: no       Last Vitals:  Vitals:   05/06/16 1045 05/06/16 1100  BP: 108/66 108/77  Pulse: (!) 105 (!) 105  Resp: (!) 21 20  Temp: 36.5 C     Last Pain:  Vitals:   05/06/16 1045  TempSrc:   PainSc: [place]

## 2016-05-06 NOTE — Op Note (Signed)
Procedure: Right Upper Arm AV graft  Preop: ESRD  Postop: ESRD  Anesthesia: General  Assistant: Silva Bandy, PA-C  Findings: 6 mm PTFE end to side to axillary vein   Procedure Details: The left upper extremity was prepped and draped in usual sterile fashion.  A longitudinal incision was then made near the antecubital crease the right arm. The incision was carried into the subcutaneous tissues down to level of the brachial artery. There appeared to be a high brachial bifurcation as the vessel was quite small. The artery was  2 mm in diameter. The vessel loops were placed proximal and distal to the planned site of arteriotomy. At this point, a longitudinal incision was made in the axilla and carried through the subcutaneous tissues and fascia to expose the axillary vein.  The nerves were protected.   The vein was approximately 4 mm in diameter.  It was dissected free and small side branches controlled with vessel loops.  The adjacent artery was then dissected free circumferentially it was 3 mm.  Next, a subcutaneous tunnel was created making an upper arm loop over the biceps muscle.  A 6 mm PTFE graft was then brought through this subcutaneous tunnel. The patient was given 7000 units of intravenous heparin. After appropriate circulation time, the vessel loops were used to control the artery. A longitudinal opening was made in the left brachial artery in the axilla.  The end of the graft was beveled and sewn end to side to the artery using a 6 0 prolene.  At completion of the anastomosis the artery was forward bled, backbled and thoroughly flushed.  The anastomosis was secured, vessel loops were released and there was palpable pulse in the graft.  The graft was clamped just above the arterial anastomosis with a fistula clamp. The graft was then pulled taut to length at the axillary incision.  The axillary vein was controlled with a fine bulldog clamp proximally and distally in the upper axilla.  The vein  was opened longitudinally.  The distal end of the graft was then beveled and sewn end to side to the vein using a running 6 0 prolene.  Just prior to completion of the anastomosis, everything was forward bled, back bled and thoroughly flushed.  The anastomosis was secured and the fistula clamp removed from the proximal graft.  A thrill was immediately palpable in the graft.  After hemostasis was obtained, the subcutaneous tissues were reapproximated using a running 3-0 Vicryl suture. The skin was then closed with a 4 0 Vicryl subcuticular stitch. Dermabond was applied to the skin incisions.  The patient tolerated the procedure well and there were no complications.  Instrument sponge and needle count was correct at the end of the case.  The patient was taken to the recovery room in stable condition.  Ruta Hinds, MD [facility] Office: [phone] Pager: [phone]

## 2016-05-06 NOTE — H&P (View-Only) (Signed)
Patient is a 51 year old male who returns for follow-up today. He was recently scheduled to have a right upper arm graft placed several weeks ago. However that time he had an acute DVT. He was on Coumadin for about a month. He subsequently stopped this after bleeding from an abscess in his groin. He currently is dialyzing via a catheter. His dialysis today is Monday Wednesday Friday. He previously had a left arm AV graft which failed after about a year.  Past Medical History:  Diagnosis Date  . Anemia   . Arthritis   . BPH (benign prostatic hyperplasia)   . Chronic back pain   . Difficulty walking   . DVT (deep venous thrombosis) ([place])    Right popliteal DVT December 2017  . End-stage renal disease on hemodialysis ([place])    Dr. Lowanda Foster  . Erectile dysfunction   . Essential hypertension   . Gout   . Neuropathy, diabetic ([place])   . Type 2 diabetes mellitus ([place])   . Venous (peripheral) insufficiency      Review of systems: He denies shortness of breath. He denies chest pain. He states that the infection in his groin has now resolved.   Data: Patient had duplex ultrasound yesterday which showed resolution of the top popliteal DVT in his right leg. Prior vein mapping reviewed marginal veins bilateral upper extremities normal arterial anatomy right arm  Physical exam:  Vitals:   04/24/16 1044  BP: 100/80  Pulse: (!) 107  Resp: 20  Temp: 97.5 F (36.4 C)  TempSrc: Oral  SpO2: 96%  Weight: 252 lb (114.3 kg)  Height: 5\' 11"  (1.803 m)    Extremities: Absent radial ulnar pulse right arm no obvious surface veins  Chest: Clear to auscultation bilaterally  Cardiac: Regular rate and rhythm  Assessment: #1 patient with end-stage renal disease needs long-term hemodialysis access. The patient is now off his Coumadin. His DVT is resolved. Plan will be for right arm AV graft on 05/06/2016. Risks benefits possible complications and procedure details were discussed the patient today  including but not limited to bleeding infection graft thrombosis ischemic steal patient wishes to proceed.  Chronic medical problems of hypertension diabetes and neuropathy are all currently stable.  Ruta Hinds, MD [facility] [place] Office: [phone] Pager: [phone]

## 2016-05-06 NOTE — Transfer of Care (Signed)
Immediate Anesthesia Transfer of Care Note  Patient: Jack Irwin  Procedure(s) Performed: Procedure(s): INSERTION OF ARTERIOVENOUS (AV) GORE-TEX GRAFT  Right ARM (Right)  Patient Location: PACU  Anesthesia Type:MAC  Level of Consciousness: awake, alert , oriented and patient cooperative  Airway & Oxygen Therapy: Patient Spontanous Breathing and Patient connected to nasal cannula oxygen  Post-op Assessment: Report given to RN, Post -op Vital signs reviewed and stable and Patient moving all extremities X 4  Post vital signs: Reviewed and stable  Last Vitals:  Vitals:   05/06/16 0625 05/06/16 0941  BP: (!) 114/48   Pulse: 94   Resp:    Temp:  36.6 C    Last Pain:  Vitals:   05/06/16 0622  TempSrc:   PainSc: 6       Patients Stated Pain Goal: 3 (30/16/01 0932)  Complications: No apparent anesthesia complications and Pt Blood Glucose = 66, Dr Gifford Shave at bedside and notified, orders given to pacu rn

## 2016-05-06 NOTE — Anesthesia Procedure Notes (Signed)
Procedure Name: MAC Date/Time: 05/06/2016 7:42 AM Performed by: Carney Living Pre-anesthesia Checklist: Patient identified, Emergency Drugs available, Suction available, Patient being monitored and Timeout performed Patient Re-evaluated:Patient Re-evaluated prior to inductionOxygen Delivery Method: Simple face mask

## 2016-05-06 NOTE — Interval H&P Note (Signed)
History and Physical Interval Note:  05/06/2016 7:25 AM  Jack Irwin  has presented today for surgery, with the diagnosis of End stage renal disease N18.6  The various methods of treatment have been discussed with the patient and family. After consideration of risks, benefits and other options for treatment, the patient has consented to  Procedure(s): INSERTION OF ARTERIOVENOUS (AV) GORE-TEX GRAFT ARM (Right) as a surgical intervention .  The patient's history has been reviewed, patient examined, no change in status, stable for surgery.  I have reviewed the patient's chart and labs.  Questions were answered to the patient's satisfaction.     Ruta Hinds

## 2016-05-06 NOTE — Telephone Encounter (Signed)
-----   Message from Mena Goes, RN sent at 05/06/2016  9:43 AM EDT ----- Regarding: 2 weeks with PA   ----- Message ----- From: Alvia Grove, PA-C Sent: 05/06/2016   9:29 AM To: Vvs Charge Pool  s/p right upper arm loop graft 05/06/16  f/u in 2 weeks with PA per patient request. No studies.   Thanks Maudie Mercury

## 2016-05-06 NOTE — Progress Notes (Signed)
Mr Weant was present at [facility] [place] for an operation on 05/06/16.  Please excuse him from work.  Ruta Hinds, MD [facility] [place] Office: [phone] Pager: [phone]

## 2016-05-06 NOTE — Telephone Encounter (Signed)
Scheduled 4/19 @ 1:30pm. Confirmed appt with patient's wife.

## 2016-05-07 ENCOUNTER — Encounter (HOSPITAL_COMMUNITY): Payer: Self-pay | Admitting: Vascular Surgery

## 2016-05-07 DIAGNOSIS — N2581 Secondary hyperparathyroidism of renal origin: Secondary | ICD-10-CM | POA: Diagnosis not present

## 2016-05-07 DIAGNOSIS — D631 Anemia in chronic kidney disease: Secondary | ICD-10-CM | POA: Diagnosis not present

## 2016-05-07 DIAGNOSIS — Z992 Dependence on renal dialysis: Secondary | ICD-10-CM | POA: Diagnosis not present

## 2016-05-07 DIAGNOSIS — N186 End stage renal disease: Secondary | ICD-10-CM | POA: Diagnosis not present

## 2016-05-07 DIAGNOSIS — D509 Iron deficiency anemia, unspecified: Secondary | ICD-10-CM | POA: Diagnosis not present

## 2016-05-07 DIAGNOSIS — T82898A Other specified complication of vascular prosthetic devices, implants and grafts, initial encounter: Secondary | ICD-10-CM | POA: Diagnosis not present

## 2016-05-09 DIAGNOSIS — D631 Anemia in chronic kidney disease: Secondary | ICD-10-CM | POA: Diagnosis not present

## 2016-05-09 DIAGNOSIS — Z992 Dependence on renal dialysis: Secondary | ICD-10-CM | POA: Diagnosis not present

## 2016-05-09 DIAGNOSIS — N2581 Secondary hyperparathyroidism of renal origin: Secondary | ICD-10-CM | POA: Diagnosis not present

## 2016-05-09 DIAGNOSIS — T82898A Other specified complication of vascular prosthetic devices, implants and grafts, initial encounter: Secondary | ICD-10-CM | POA: Diagnosis not present

## 2016-05-09 DIAGNOSIS — D509 Iron deficiency anemia, unspecified: Secondary | ICD-10-CM | POA: Diagnosis not present

## 2016-05-09 DIAGNOSIS — N186 End stage renal disease: Secondary | ICD-10-CM | POA: Diagnosis not present

## 2016-05-12 ENCOUNTER — Ambulatory Visit (INDEPENDENT_AMBULATORY_CARE_PROVIDER_SITE_OTHER): Payer: Medicare Other | Admitting: Family Medicine

## 2016-05-12 ENCOUNTER — Encounter: Payer: Self-pay | Admitting: Vascular Surgery

## 2016-05-12 ENCOUNTER — Encounter: Payer: Self-pay | Admitting: Family Medicine

## 2016-05-12 VITALS — BP 119/77 | HR 116 | Temp 97.8°F | Resp 16 | Ht 71.0 in | Wt 260.0 lb

## 2016-05-12 DIAGNOSIS — N529 Male erectile dysfunction, unspecified: Secondary | ICD-10-CM | POA: Diagnosis not present

## 2016-05-12 DIAGNOSIS — E119 Type 2 diabetes mellitus without complications: Secondary | ICD-10-CM | POA: Diagnosis not present

## 2016-05-12 DIAGNOSIS — N186 End stage renal disease: Secondary | ICD-10-CM | POA: Diagnosis not present

## 2016-05-12 DIAGNOSIS — Z9109 Other allergy status, other than to drugs and biological substances: Secondary | ICD-10-CM | POA: Diagnosis not present

## 2016-05-12 DIAGNOSIS — D631 Anemia in chronic kidney disease: Secondary | ICD-10-CM | POA: Diagnosis not present

## 2016-05-12 DIAGNOSIS — H1013 Acute atopic conjunctivitis, bilateral: Secondary | ICD-10-CM

## 2016-05-12 DIAGNOSIS — D509 Iron deficiency anemia, unspecified: Secondary | ICD-10-CM | POA: Diagnosis not present

## 2016-05-12 DIAGNOSIS — T82898A Other specified complication of vascular prosthetic devices, implants and grafts, initial encounter: Secondary | ICD-10-CM | POA: Diagnosis not present

## 2016-05-12 DIAGNOSIS — N2581 Secondary hyperparathyroidism of renal origin: Secondary | ICD-10-CM | POA: Diagnosis not present

## 2016-05-12 DIAGNOSIS — Z992 Dependence on renal dialysis: Secondary | ICD-10-CM | POA: Diagnosis not present

## 2016-05-12 MED ORDER — OLOPATADINE HCL 0.2 % OP SOLN: 1.0000 [drp] | Freq: Every day | OPHTHALMIC | 0 refills | Status: DC

## 2016-05-12 MED ORDER — CETIRIZINE HCL 10 MG PO TABS: 10.0000 mg | ORAL_TABLET | Freq: Every day | ORAL | 11 refills | Status: DC

## 2016-05-12 MED ORDER — SILDENAFIL CITRATE 20 MG PO TABS: 20.0000 mg | ORAL_TABLET | Freq: Every day | ORAL | 5 refills | Status: DC | PRN

## 2016-05-12 NOTE — Progress Notes (Signed)
Subjective:    Patient ID: Jack Irwin, male    DOB: 1965-04-12, 51 y.o.   MRN: [id number]  Eye Problem   The left eye is affected. The current episode started in the past 7 days. The problem occurs constantly. The pain is at a severity of 3/10. The pain is mild. There is known exposure to pink eye. He does not wear contacts. Associated symptoms include an eye discharge, eye redness and itching. Pertinent negatives include no double vision, fever, nausea or photophobia.  Erectile Dysfunction  This is a chronic problem. The current episode started more than 1 year ago. The problem is unchanged. The nature of his difficulty is achieving erection, maintaining erection and penetration. Irritative symptoms do not include nocturia or urgency. Obstructive symptoms do not include dribbling, incomplete emptying, a slower stream, straining or a weak stream. Pertinent negatives include no chills, dysuria, genital pain, hematuria, hesitancy or inability to urinate.   Past Medical History:  Diagnosis Date  . Anemia   . Arthritis   . BPH (benign prostatic hyperplasia)   . Chronic back pain   . Difficulty walking   . DVT (deep venous thrombosis) ([place])    Right popliteal DVT December 2017  . End-stage renal disease on hemodialysis ([place])    Dr. Lowanda Foster  . Erectile dysfunction   . Essential hypertension   . Gout   . Neuropathy, diabetic ([place])   . Type 2 diabetes mellitus ([place])   . Venous (peripheral) insufficiency    Social History   Social History  . Marital status: Married    Spouse name: N/A  . Number of children: N/A  . Years of education: N/A   Occupational History  . Not on file.   Social History Main Topics  . Smoking status: Never Smoker  . Smokeless tobacco: Never Used  . Alcohol use No  . Drug use: No  . Sexual activity: Not on file   Other Topics Concern  . Not on file   Social History Narrative  . No narrative on file   Immunization History  Administered  Date(s) Administered  . PPD Test 02/17/2014    Review of Systems  Constitutional: Negative.  Negative for chills and fever.  HENT: Negative.   Eyes: Positive for discharge, redness and itching. Negative for double vision and photophobia.  Respiratory: Negative.   Cardiovascular: Negative.   Gastrointestinal: Negative.  Negative for nausea.  Endocrine: Negative for polydipsia, polyphagia and polyuria.  Genitourinary: Negative.  Negative for dysuria, hematuria, hesitancy, incomplete emptying, nocturia and urgency.  Musculoskeletal: Negative.   Allergic/Immunologic: Negative.   Neurological: Negative.   Hematological: Negative.        Objective:   Physical Exam  Constitutional: He is oriented to person, place, and time.  HENT:  Head: Normocephalic and atraumatic.  Right Ear: External ear normal. No drainage. Tympanic membrane is not erythematous.  Left Ear: External ear normal. No drainage. Tympanic membrane is not erythematous.  Nose: Nose normal.  Mouth/Throat: Oropharynx is clear and moist.  Eyes: Left eye exhibits discharge (Rope like discharge). Left conjunctiva is not injected.  Eye reddness Clear drainage   Cardiovascular: Normal rate, regular rhythm, normal heart sounds and intact distal pulses.   Pulmonary/Chest: Effort normal and breath sounds normal.  Neurological: He is alert and oriented to person, place, and time. He has normal reflexes.  Skin: Skin is warm and dry.  Psychiatric: He has a normal mood and affect. His behavior is normal. Judgment and thought content  normal.     BP 119/77 (BP Location: Left Arm, Patient Position: Sitting, Cuff Size: Large)   Pulse (!) 116   Temp 97.8 F (36.6 C) (Oral)   Resp 16   Ht 5\' 11"  (1.803 m)   Wt 260 lb (117.9 kg)   SpO2 100%   BMI 36.26 kg/m  Assessment & Plan:  1. Allergic conjunctivitis of both eyes Avoiding the allergen: Keeping the house clean, minimizing soft furnishings, and staying indoors when the pollen  count is high can help. Avoiding contact lenses: These should not be used until symptoms have completely disappeared. After using any medication on the eye, wait 24 hours after treatment has ended before wearing contact lenses.  Refraining from rubbing the eyes: Rubbing can make the inflammation worse.  This can be difficult, as it is tempting to rub itchy eyes.  Cold compresses: Holding a wad of cotton wool soaked in cold water on the eyelid can soothe the eyes. - cetirizine (ZYRTEC) 10 MG tablet; Take 1 tablet (10 mg total) by mouth daily.  Dispense: 30 tablet; Refill: 11 - Olopatadine HCl 0.2 % SOLN; Apply 1 drop to eye daily.  Dispense: 1 Bottle; Refill: 0  2. Environmental allergies  - cetirizine (ZYRTEC) 10 MG tablet; Take 1 tablet (10 mg total) by mouth daily.  Dispense: 30 tablet; Refill: 11  3. Erectile dysfunction, unspecified erectile dysfunction type  - sildenafil (REVATIO) 20 MG tablet; Take 1 tablet (20 mg total) by mouth daily as needed. Dissolved under tongue (TROCHE) 60 minutes prior to sexual encounter  Dispense: 10 tablet; Refill: 5   RTC: As previously scheduled   [name]  MSN, FNP-C [facility] [place], [place] [address] [phone]

## 2016-05-12 NOTE — Patient Instructions (Addendum)
Allergic conjunctivitis Avoiding the allergen: Keeping the house clean, minimizing soft furnishings, and staying indoors when the pollen count is high can help.  [place] allergy drops: These eye drops dilute the allergen and help remove it.  Avoiding contact lenses: These should not be used until symptoms have completely disappeared. After using any medication on the eye, wait 24 hours after treatment has ended before wearing contact lenses. Refraining from rubbing the eyes:   Rubbing can make the inflammation worse. This can be difficult, as it is tempting to rub itchy eyes.   Cold compresses: Holding a wad of cotton wool soaked in cold water on the eyelid can soothe the eyes.  Allergic Conjunctivitis A clear membrane (conjunctiva) covers the white part of your eye and the inner surface of your eyelid. Allergic conjunctivitis happens when this membrane has inflammation. This is caused by allergies. Common causes of allergic reactions (allergens)include:  Outdoor allergens, such as:  Pollen.  Grass and weeds.  Mold spores.  Indoor allergens, such as:  Dust.  Smoke.  Mold.  Pet dander.  Animal hair. This condition can make your eye red or pink. It can also make your eye feel itchy. This condition cannot be spread from one person to another person (is not contagious). Follow these instructions at home:  Try not to be around things that you are allergic to.  Take or apply over-the-counter and prescription medicines only as told by your doctor. These include any eye drops.  Place a cool, clean washcloth on your eye for 10-20 minutes. Do this 3-4 times a day.  Do not touch or rub your eyes.  Do not wear contact lenses until the inflammation is gone. Wear glasses instead.  Do not wear eye makeup until the inflammation is gone.  Keep all follow-up visits as told by your doctor. This is important. Contact a doctor if:  Your symptoms get worse.  Your symptoms do not get  better with treatment.  You have mild eye pain.  You are sensitive to light,  You have spots or blisters on your eyes.  You have pus coming from your eye.  You have a fever. Get help right away if:  You have redness, swelling, or other symptoms in only one eye.  Your vision is blurry.  You have vision changes.  You have very bad eye pain. Summary  Allergic conjunctivitis is caused by allergies. It can make your eye red or pink, and it can make your eye feel itchy.  This condition cannot be spread from one person to another person (is not contagious).  Try not to be around things that you are allergic to.  Take or apply over-the-counter and prescription medicines only as told by your doctor. These include any eye drops.  Contact your doctor if your symptoms get worse or they do not get better with treatment. This information is not intended to replace advice given to you by your health care provider. Make sure you discuss any questions you have with your health care provider. Document Released: 07/10/2009 Document Revised: 09/14/2015 Document Reviewed: 09/14/2015 Elsevier Interactive Patient Education  2017 [employer].  Erectile Dysfunction Erectile dysfunction (ED) is the inability to get or keep an erection in order to have sexual intercourse. Erectile dysfunction may include:  Inability to get an erection.  Lack of enough hardness of the erection to allow penetration.  Loss of the erection before sex is finished. What are the causes? This condition may be caused by:  Certain  medicines, such as:  Pain relievers.  Antihistamines.  Antidepressants.  Blood pressure medicines.  Water pills (diuretics).  Ulcer medicines.  Muscle relaxants.  Drugs.  Excessive drinking.  Psychological causes, such as:  Anxiety.  Depression.  Sadness.  Exhaustion.  Performance fear.  Stress.  Physical causes, such as:  Artery problems. This may include  diabetes, smoking, liver disease, or atherosclerosis.  High blood pressure.  Hormonal problems, such as low testosterone.  Obesity.  Nerve problems. This may include back or pelvic injuries, diabetes mellitus, multiple sclerosis, or Parkinson disease. What are the signs or symptoms? Symptoms of this condition include:  Inability to get an erection.  Lack of enough hardness of the erection to allow penetration.  Loss of the erection before sex is finished.  Normal erections at some times, but with frequent unsatisfactory episodes.  Low sexual satisfaction in either partner due to erection problems.  A curved penis occurring with erection. The curve may cause pain or the penis may be too curved to allow for intercourse.  Never having nighttime erections. How is this diagnosed? This condition is often diagnosed by:  Performing a physical exam to find other diseases or specific problems with the penis.  Asking you detailed questions about the problem.  Performing blood tests to check for diabetes mellitus or to measure hormone levels.  Performing other tests to check for underlying health conditions.  Performing an ultrasound exam to check for scarring.  Performing a test to check blood flow to the penis.  Doing a sleep study at home to measure nighttime erections. How is this treated? This condition may be treated by:  Medicine taken by mouth to help you achieve an erection (oral medicine).  Hormone replacement therapy to replace low testosterone levels.  Medicine that is injected into the penis. Your health care provider may instruct you how to give yourself these injections at home.  Vacuum pump. This is a pump with a ring on it. The pump and ring are placed on the penis and used to create pressure that helps the penis become erect.  Penile implant surgery. In this procedure, you may receive:  An inflatable implant. This consists of cylinders, a pump, and a  reservoir. The cylinders can be inflated with a fluid that helps to create an erection, and they can be deflated after intercourse.  A semi-rigid implant. This consists of two silicone rubber rods. The rods provide some rigidity. They are also flexible, so the penis can both curve downward in its normal position and become straight for sexual intercourse.  Blood vessel surgery, to improve blood flow to the penis. During this procedure, a blood vessel from a different part of the body is placed into the penis to allow blood to flow around (bypass) damaged or blocked blood vessels.  Lifestyle changes, such as exercising more, losing weight, and quitting smoking. Follow these instructions at home: Medicines   Take over-the-counter and prescription medicines only as told by your health care provider. Do not increase the dosage without first discussing it with your health care provider.  If you are using self-injections, perform injections as directed by your health care provider. Make sure to avoid any veins that are on the surface of the penis. After giving an injection, apply pressure to the injection site for 5 minutes. General instructions   Exercise regularly, as directed by your health care provider. Work with your health care provider to lose weight, if needed.  Do not use any products that  contain nicotine or tobacco, such as cigarettes and e-cigarettes. If you need help quitting, ask your health care provider.  Before using a vacuum pump, read the instructions that come with the pump and discuss any questions with your health care provider.  Keep all follow-up visits as told by your health care provider. This is important. Contact a health care provider if:  You feel nauseous.  You vomit. Get help right away if:  You are taking oral or injectable medicines and you have an erection that lasts longer than 4 hours. If your health care provider is unavailable, go to the nearest  emergency room for evaluation. An erection that lasts much longer than 4 hours can result in permanent damage to your penis.  You have severe pain in your groin or abdomen.  You develop redness or severe swelling of your penis.  You have redness spreading up into your groin or lower abdomen.  You are unable to urinate.  You experience chest pain or a rapid heart beat (palpitations) after taking oral medicines. Summary  Erectile dysfunction (ED) is the inability to get or keep an erection during sexual intercourse. This problem can usually be treated successfully.  This condition is diagnosed based on a physical exam, your symptoms, and tests to determine the cause. Treatment varies depending on the cause, and may include medicines, hormone therapy, surgery, or vacuum pump.  You may need follow-up visits to make sure that you are using your medicines or devices correctly.  Get help right away if you are taking or injecting medicines and you have an erection that lasts longer than 4 hours. This information is not intended to replace advice given to you by your health care provider. Make sure you discuss any questions you have with your health care provider. Document Released: 01/18/2000 Document Revised: 02/06/2016 Document Reviewed: 02/06/2016 Elsevier Interactive Patient Education  2017 [employer].

## 2016-05-14 ENCOUNTER — Encounter (HOSPITAL_COMMUNITY): Payer: Self-pay

## 2016-05-14 DIAGNOSIS — N2581 Secondary hyperparathyroidism of renal origin: Secondary | ICD-10-CM | POA: Diagnosis not present

## 2016-05-14 DIAGNOSIS — D631 Anemia in chronic kidney disease: Secondary | ICD-10-CM | POA: Diagnosis not present

## 2016-05-14 DIAGNOSIS — T82898A Other specified complication of vascular prosthetic devices, implants and grafts, initial encounter: Secondary | ICD-10-CM | POA: Diagnosis not present

## 2016-05-14 DIAGNOSIS — Z992 Dependence on renal dialysis: Secondary | ICD-10-CM | POA: Diagnosis not present

## 2016-05-14 DIAGNOSIS — N186 End stage renal disease: Secondary | ICD-10-CM | POA: Diagnosis not present

## 2016-05-14 DIAGNOSIS — D509 Iron deficiency anemia, unspecified: Secondary | ICD-10-CM | POA: Diagnosis not present

## 2016-05-15 ENCOUNTER — Ambulatory Visit: Payer: Self-pay | Admitting: Vascular Surgery

## 2016-05-15 ENCOUNTER — Other Ambulatory Visit: Payer: Self-pay | Admitting: Family Medicine

## 2016-05-15 ENCOUNTER — Telehealth: Payer: Self-pay

## 2016-05-15 DIAGNOSIS — H109 Unspecified conjunctivitis: Secondary | ICD-10-CM

## 2016-05-15 MED ORDER — AZITHROMYCIN 1 % OP SOLN: 1.0000 [drp] | Freq: Two times a day (BID) | OPHTHALMIC | 0 refills | Status: AC

## 2016-05-15 NOTE — Progress Notes (Signed)
Meds ordered this encounter  Medications  . azithromycin (AZASITE) 1 % ophthalmic solution    Sig: Place 1 drop into both eyes 2 (two) times daily. For 5 days    Dispense:  2.5 mL    Refill:  0    [place]  MSN, FNP-C [facility] [address] [place], [place] [address] [phone]

## 2016-05-15 NOTE — Telephone Encounter (Signed)
[place] Please advise. Thanks!

## 2016-05-16 DIAGNOSIS — N186 End stage renal disease: Secondary | ICD-10-CM | POA: Diagnosis not present

## 2016-05-16 DIAGNOSIS — D509 Iron deficiency anemia, unspecified: Secondary | ICD-10-CM | POA: Diagnosis not present

## 2016-05-16 DIAGNOSIS — Z992 Dependence on renal dialysis: Secondary | ICD-10-CM | POA: Diagnosis not present

## 2016-05-16 DIAGNOSIS — T82898A Other specified complication of vascular prosthetic devices, implants and grafts, initial encounter: Secondary | ICD-10-CM | POA: Diagnosis not present

## 2016-05-16 DIAGNOSIS — N2581 Secondary hyperparathyroidism of renal origin: Secondary | ICD-10-CM | POA: Diagnosis not present

## 2016-05-16 DIAGNOSIS — D631 Anemia in chronic kidney disease: Secondary | ICD-10-CM | POA: Diagnosis not present

## 2016-05-19 DIAGNOSIS — D631 Anemia in chronic kidney disease: Secondary | ICD-10-CM | POA: Diagnosis not present

## 2016-05-19 DIAGNOSIS — D509 Iron deficiency anemia, unspecified: Secondary | ICD-10-CM | POA: Diagnosis not present

## 2016-05-19 DIAGNOSIS — N2581 Secondary hyperparathyroidism of renal origin: Secondary | ICD-10-CM | POA: Diagnosis not present

## 2016-05-19 DIAGNOSIS — T82898A Other specified complication of vascular prosthetic devices, implants and grafts, initial encounter: Secondary | ICD-10-CM | POA: Diagnosis not present

## 2016-05-19 DIAGNOSIS — N186 End stage renal disease: Secondary | ICD-10-CM | POA: Diagnosis not present

## 2016-05-19 DIAGNOSIS — Z992 Dependence on renal dialysis: Secondary | ICD-10-CM | POA: Diagnosis not present

## 2016-05-21 DIAGNOSIS — D509 Iron deficiency anemia, unspecified: Secondary | ICD-10-CM | POA: Diagnosis not present

## 2016-05-21 DIAGNOSIS — D631 Anemia in chronic kidney disease: Secondary | ICD-10-CM | POA: Diagnosis not present

## 2016-05-21 DIAGNOSIS — T82898A Other specified complication of vascular prosthetic devices, implants and grafts, initial encounter: Secondary | ICD-10-CM | POA: Diagnosis not present

## 2016-05-21 DIAGNOSIS — N186 End stage renal disease: Secondary | ICD-10-CM | POA: Diagnosis not present

## 2016-05-21 DIAGNOSIS — N2581 Secondary hyperparathyroidism of renal origin: Secondary | ICD-10-CM | POA: Diagnosis not present

## 2016-05-21 DIAGNOSIS — Z992 Dependence on renal dialysis: Secondary | ICD-10-CM | POA: Diagnosis not present

## 2016-05-22 ENCOUNTER — Ambulatory Visit (INDEPENDENT_AMBULATORY_CARE_PROVIDER_SITE_OTHER): Payer: Self-pay | Admitting: Physician Assistant

## 2016-05-22 VITALS — BP 115/82 | HR 98 | Temp 97.2°F | Resp 20 | Ht 71.0 in | Wt 262.5 lb

## 2016-05-22 DIAGNOSIS — Z992 Dependence on renal dialysis: Secondary | ICD-10-CM

## 2016-05-22 DIAGNOSIS — N186 End stage renal disease: Secondary | ICD-10-CM

## 2016-05-22 NOTE — Progress Notes (Signed)
  POST OPERATIVE OFFICE NOTE    CC:  F/u for surgery  HPI:  This is a 51 y.o. male who is s/p Right UE av graft 05/06/2016.  No signs of steal.  No numbness, weakness or skin discoloration.  He has known arthritis in his right hand.    Allergies  Allergen Reactions  . Doxycycline Itching    Current Outpatient Prescriptions  Medication Sig Dispense Refill  . cetirizine (ZYRTEC) 10 MG tablet Take 1 tablet (10 mg total) by mouth daily. 30 tablet 11  . colchicine 0.6 MG tablet Take 0.5 tablets (0.3 mg total) by mouth daily as needed (for gout).    [name] gabapentin (NEURONTIN) 100 MG capsule Take 100 mg by mouth daily.     . Multiple Vitamin (DAILY VITE PO) Take 1 tablet by mouth daily.    . Olopatadine HCl 0.2 % SOLN Apply 1 drop to eye daily. 1 Bottle 0  . sevelamer carbonate (RENVELA) 800 MG tablet Take 4 tablets (3,200 mg total) by mouth 3 (three) times daily with meals. 90 tablet 0  . sevelamer carbonate (RENVELA) 800 MG tablet Take 1 tablet (800 mg total) by mouth 2 (two) times daily between meals as needed (With snacks). 60 tablet 0  . sildenafil (REVATIO) 20 MG tablet Take 1 tablet (20 mg total) by mouth daily as needed. Dissolved under tongue (TROCHE) 60 minutes prior to sexual encounter 10 tablet 5  . triamcinolone cream (KENALOG) 0.1 % Apply 1 application topically 2 (two) times daily as needed.     [name] ULORIC 80 MG TABS Take 80 mg by mouth daily.    [name] oxyCODONE-acetaminophen (PERCOCET/ROXICET) 5-325 MG tablet Take 1 tablet by mouth every 6 (six) hours as needed. (Patient not taking: Reported on 05/22/2016) 20 tablet 0   No current facility-administered medications for this visit.      ROS:  See HPI  Physical Exam:  Vitals:   05/22/16 1330  BP: 115/82  Pulse: 98  Resp: 20  Temp: 97.2 F (36.2 C)    Incision:  Well healed without hematoma or drainage Extremities:  Grip 4/5 no change since prior to procedure Palpable thrill throughout the graft    Assessment/Plan:  This is  a 50 y.o. male who is s/p: Right UE av graft  The graft can be used 4 weeks from surgery 05/06/2016. F/U PRN   Roxy Horseman PA-C [facility] [phone]  Clinic MD:  Oneida Alar

## 2016-05-23 ENCOUNTER — Telehealth: Payer: Self-pay

## 2016-05-23 DIAGNOSIS — T82898A Other specified complication of vascular prosthetic devices, implants and grafts, initial encounter: Secondary | ICD-10-CM | POA: Diagnosis not present

## 2016-05-23 DIAGNOSIS — D509 Iron deficiency anemia, unspecified: Secondary | ICD-10-CM | POA: Diagnosis not present

## 2016-05-23 DIAGNOSIS — N186 End stage renal disease: Secondary | ICD-10-CM | POA: Diagnosis not present

## 2016-05-23 DIAGNOSIS — D631 Anemia in chronic kidney disease: Secondary | ICD-10-CM | POA: Diagnosis not present

## 2016-05-23 DIAGNOSIS — N2581 Secondary hyperparathyroidism of renal origin: Secondary | ICD-10-CM | POA: Diagnosis not present

## 2016-05-23 DIAGNOSIS — Z992 Dependence on renal dialysis: Secondary | ICD-10-CM | POA: Diagnosis not present

## 2016-05-23 NOTE — Telephone Encounter (Signed)
Called patient and advised he has appointment with Dr Prudence Davidson at [facility] 05/30/2016.

## 2016-05-26 DIAGNOSIS — D509 Iron deficiency anemia, unspecified: Secondary | ICD-10-CM | POA: Diagnosis not present

## 2016-05-26 DIAGNOSIS — N186 End stage renal disease: Secondary | ICD-10-CM | POA: Diagnosis not present

## 2016-05-26 DIAGNOSIS — Z992 Dependence on renal dialysis: Secondary | ICD-10-CM | POA: Diagnosis not present

## 2016-05-26 DIAGNOSIS — T82898A Other specified complication of vascular prosthetic devices, implants and grafts, initial encounter: Secondary | ICD-10-CM | POA: Diagnosis not present

## 2016-05-26 DIAGNOSIS — D631 Anemia in chronic kidney disease: Secondary | ICD-10-CM | POA: Diagnosis not present

## 2016-05-26 DIAGNOSIS — N2581 Secondary hyperparathyroidism of renal origin: Secondary | ICD-10-CM | POA: Diagnosis not present

## 2016-05-28 ENCOUNTER — Other Ambulatory Visit: Payer: Self-pay | Admitting: Urology

## 2016-05-28 DIAGNOSIS — C649 Malignant neoplasm of unspecified kidney, except renal pelvis: Secondary | ICD-10-CM

## 2016-05-28 DIAGNOSIS — N2581 Secondary hyperparathyroidism of renal origin: Secondary | ICD-10-CM | POA: Diagnosis not present

## 2016-05-28 DIAGNOSIS — D631 Anemia in chronic kidney disease: Secondary | ICD-10-CM | POA: Diagnosis not present

## 2016-05-28 DIAGNOSIS — D509 Iron deficiency anemia, unspecified: Secondary | ICD-10-CM | POA: Diagnosis not present

## 2016-05-28 DIAGNOSIS — Z992 Dependence on renal dialysis: Secondary | ICD-10-CM | POA: Diagnosis not present

## 2016-05-28 DIAGNOSIS — T82898A Other specified complication of vascular prosthetic devices, implants and grafts, initial encounter: Secondary | ICD-10-CM | POA: Diagnosis not present

## 2016-05-28 DIAGNOSIS — N186 End stage renal disease: Secondary | ICD-10-CM | POA: Diagnosis not present

## 2016-05-29 DIAGNOSIS — D509 Iron deficiency anemia, unspecified: Secondary | ICD-10-CM | POA: Diagnosis not present

## 2016-05-29 DIAGNOSIS — N2581 Secondary hyperparathyroidism of renal origin: Secondary | ICD-10-CM | POA: Diagnosis not present

## 2016-05-29 DIAGNOSIS — D631 Anemia in chronic kidney disease: Secondary | ICD-10-CM | POA: Diagnosis not present

## 2016-05-29 DIAGNOSIS — Z992 Dependence on renal dialysis: Secondary | ICD-10-CM | POA: Diagnosis not present

## 2016-05-29 DIAGNOSIS — N186 End stage renal disease: Secondary | ICD-10-CM | POA: Diagnosis not present

## 2016-05-29 DIAGNOSIS — T82898A Other specified complication of vascular prosthetic devices, implants and grafts, initial encounter: Secondary | ICD-10-CM | POA: Diagnosis not present

## 2016-05-30 ENCOUNTER — Ambulatory Visit (INDEPENDENT_AMBULATORY_CARE_PROVIDER_SITE_OTHER): Payer: Medicare Other | Admitting: Podiatry

## 2016-05-30 ENCOUNTER — Encounter: Payer: Self-pay | Admitting: Podiatry

## 2016-05-30 DIAGNOSIS — I10 Essential (primary) hypertension: Secondary | ICD-10-CM

## 2016-05-30 DIAGNOSIS — M79676 Pain in unspecified toe(s): Secondary | ICD-10-CM

## 2016-05-30 DIAGNOSIS — E11621 Type 2 diabetes mellitus with foot ulcer: Secondary | ICD-10-CM

## 2016-05-30 DIAGNOSIS — I152 Hypertension secondary to endocrine disorders: Secondary | ICD-10-CM

## 2016-05-30 DIAGNOSIS — L97511 Non-pressure chronic ulcer of other part of right foot limited to breakdown of skin: Secondary | ICD-10-CM | POA: Diagnosis not present

## 2016-05-30 DIAGNOSIS — B351 Tinea unguium: Secondary | ICD-10-CM

## 2016-05-30 DIAGNOSIS — E1159 Type 2 diabetes mellitus with other circulatory complications: Secondary | ICD-10-CM

## 2016-05-30 NOTE — Progress Notes (Signed)
This patient presents the office with chief complaint of long thick painful nails. He states the nails are painful walking and wearing his shoes. He states he is a diabetic with kidney disease.  He desires to obtain a pair of diabetic shoes and requests an evaluation of his diabetic feet fto determine if he qualifies.   GENERAL APPEARANCE: Alert, conversant. Appropriately groomed. No acute distress.  VASCULAR: Pedal pulses are not  palpable at  [facility] and PT bilateral.  Capillary refill time is immediate to all digits,  Normal temperature gradient.   NEUROLOGIC: sensation is absent  to 5.07 monofilament at 5/5 sites bilateral.  Light touch is intact bilateral, Muscle strength normal.  MUSCULOSKELETAL: acceptable muscle strength, tone and stability bilateral.  Intrinsic muscluature intact bilateral.  Rectus appearance of foot and digits noted bilateral.   DERMATOLOGIC: skin color, texture, and turgor are within normal limits.  No preulcerative lesions or ulcers  are seen, no interdigital maceration noted.  No open lesions present.  No drainage noted.  NAILS  thick disfigured discolored nails all 10 toes on both feet   Onychomycosis  B/L  Diabetes with angiopathy and neuropathy.   ROV  Debridement of nails.  Initiate paperwork for diabetic shoes for DPN and DPA.   RTC 3 months.  Patient will be called to be measured for her shoes.   Gardiner Barefoot DPM

## 2016-05-31 DIAGNOSIS — T82898A Other specified complication of vascular prosthetic devices, implants and grafts, initial encounter: Secondary | ICD-10-CM | POA: Diagnosis not present

## 2016-05-31 DIAGNOSIS — N2581 Secondary hyperparathyroidism of renal origin: Secondary | ICD-10-CM | POA: Diagnosis not present

## 2016-05-31 DIAGNOSIS — D509 Iron deficiency anemia, unspecified: Secondary | ICD-10-CM | POA: Diagnosis not present

## 2016-05-31 DIAGNOSIS — N186 End stage renal disease: Secondary | ICD-10-CM | POA: Diagnosis not present

## 2016-05-31 DIAGNOSIS — D631 Anemia in chronic kidney disease: Secondary | ICD-10-CM | POA: Diagnosis not present

## 2016-05-31 DIAGNOSIS — Z992 Dependence on renal dialysis: Secondary | ICD-10-CM | POA: Diagnosis not present

## 2016-06-02 DIAGNOSIS — D631 Anemia in chronic kidney disease: Secondary | ICD-10-CM | POA: Diagnosis not present

## 2016-06-02 DIAGNOSIS — Z992 Dependence on renal dialysis: Secondary | ICD-10-CM | POA: Diagnosis not present

## 2016-06-02 DIAGNOSIS — T82898A Other specified complication of vascular prosthetic devices, implants and grafts, initial encounter: Secondary | ICD-10-CM | POA: Diagnosis not present

## 2016-06-02 DIAGNOSIS — N186 End stage renal disease: Secondary | ICD-10-CM | POA: Diagnosis not present

## 2016-06-02 DIAGNOSIS — N2581 Secondary hyperparathyroidism of renal origin: Secondary | ICD-10-CM | POA: Diagnosis not present

## 2016-06-02 DIAGNOSIS — D509 Iron deficiency anemia, unspecified: Secondary | ICD-10-CM | POA: Diagnosis not present

## 2016-06-04 ENCOUNTER — Other Ambulatory Visit: Payer: Medicare Other

## 2016-06-04 DIAGNOSIS — N186 End stage renal disease: Secondary | ICD-10-CM | POA: Diagnosis not present

## 2016-06-04 DIAGNOSIS — R112 Nausea with vomiting, unspecified: Secondary | ICD-10-CM | POA: Diagnosis not present

## 2016-06-04 DIAGNOSIS — D509 Iron deficiency anemia, unspecified: Secondary | ICD-10-CM | POA: Diagnosis not present

## 2016-06-04 DIAGNOSIS — Z992 Dependence on renal dialysis: Secondary | ICD-10-CM | POA: Diagnosis not present

## 2016-06-04 DIAGNOSIS — N2581 Secondary hyperparathyroidism of renal origin: Secondary | ICD-10-CM | POA: Diagnosis not present

## 2016-06-05 ENCOUNTER — Ambulatory Visit: Payer: Self-pay | Admitting: Family Medicine

## 2016-06-06 DIAGNOSIS — N2581 Secondary hyperparathyroidism of renal origin: Secondary | ICD-10-CM | POA: Diagnosis not present

## 2016-06-06 DIAGNOSIS — R112 Nausea with vomiting, unspecified: Secondary | ICD-10-CM | POA: Diagnosis not present

## 2016-06-06 DIAGNOSIS — Z992 Dependence on renal dialysis: Secondary | ICD-10-CM | POA: Diagnosis not present

## 2016-06-06 DIAGNOSIS — D509 Iron deficiency anemia, unspecified: Secondary | ICD-10-CM | POA: Diagnosis not present

## 2016-06-06 DIAGNOSIS — N186 End stage renal disease: Secondary | ICD-10-CM | POA: Diagnosis not present

## 2016-06-09 DIAGNOSIS — Z992 Dependence on renal dialysis: Secondary | ICD-10-CM | POA: Diagnosis not present

## 2016-06-09 DIAGNOSIS — R112 Nausea with vomiting, unspecified: Secondary | ICD-10-CM | POA: Diagnosis not present

## 2016-06-09 DIAGNOSIS — D509 Iron deficiency anemia, unspecified: Secondary | ICD-10-CM | POA: Diagnosis not present

## 2016-06-09 DIAGNOSIS — N186 End stage renal disease: Secondary | ICD-10-CM | POA: Diagnosis not present

## 2016-06-09 DIAGNOSIS — N2581 Secondary hyperparathyroidism of renal origin: Secondary | ICD-10-CM | POA: Diagnosis not present

## 2016-06-10 ENCOUNTER — Ambulatory Visit (HOSPITAL_COMMUNITY)
Admission: RE | Admit: 2016-06-10 | Discharge: 2016-06-10 | Disposition: A | Discharge status: Home / Self Care | Payer: Medicare Other | Source: Ambulatory Visit | Attending: Urology | Admitting: Urology

## 2016-06-10 DIAGNOSIS — C649 Malignant neoplasm of unspecified kidney, except renal pelvis: Secondary | ICD-10-CM | POA: Insufficient documentation

## 2016-06-10 DIAGNOSIS — D3501 Benign neoplasm of right adrenal gland: Secondary | ICD-10-CM | POA: Insufficient documentation

## 2016-06-10 DIAGNOSIS — N289 Disorder of kidney and ureter, unspecified: Secondary | ICD-10-CM | POA: Diagnosis not present

## 2016-06-11 ENCOUNTER — Telehealth: Payer: Self-pay

## 2016-06-11 ENCOUNTER — Other Ambulatory Visit: Payer: Medicare Other

## 2016-06-11 ENCOUNTER — Other Ambulatory Visit: Payer: Self-pay | Admitting: Family Medicine

## 2016-06-11 DIAGNOSIS — E118 Type 2 diabetes mellitus with unspecified complications: Secondary | ICD-10-CM

## 2016-06-11 NOTE — Telephone Encounter (Signed)
fyi

## 2016-06-12 DIAGNOSIS — D509 Iron deficiency anemia, unspecified: Secondary | ICD-10-CM | POA: Diagnosis not present

## 2016-06-12 DIAGNOSIS — N186 End stage renal disease: Secondary | ICD-10-CM | POA: Diagnosis not present

## 2016-06-12 DIAGNOSIS — R112 Nausea with vomiting, unspecified: Secondary | ICD-10-CM | POA: Diagnosis not present

## 2016-06-12 DIAGNOSIS — Z992 Dependence on renal dialysis: Secondary | ICD-10-CM | POA: Diagnosis not present

## 2016-06-12 DIAGNOSIS — N2581 Secondary hyperparathyroidism of renal origin: Secondary | ICD-10-CM | POA: Diagnosis not present

## 2016-06-13 DIAGNOSIS — N2581 Secondary hyperparathyroidism of renal origin: Secondary | ICD-10-CM | POA: Diagnosis not present

## 2016-06-13 DIAGNOSIS — D509 Iron deficiency anemia, unspecified: Secondary | ICD-10-CM | POA: Diagnosis not present

## 2016-06-13 DIAGNOSIS — Z992 Dependence on renal dialysis: Secondary | ICD-10-CM | POA: Diagnosis not present

## 2016-06-13 DIAGNOSIS — R112 Nausea with vomiting, unspecified: Secondary | ICD-10-CM | POA: Diagnosis not present

## 2016-06-13 DIAGNOSIS — N186 End stage renal disease: Secondary | ICD-10-CM | POA: Diagnosis not present

## 2016-06-16 DIAGNOSIS — N2581 Secondary hyperparathyroidism of renal origin: Secondary | ICD-10-CM | POA: Diagnosis not present

## 2016-06-16 DIAGNOSIS — Z992 Dependence on renal dialysis: Secondary | ICD-10-CM | POA: Diagnosis not present

## 2016-06-16 DIAGNOSIS — N186 End stage renal disease: Secondary | ICD-10-CM | POA: Diagnosis not present

## 2016-06-16 DIAGNOSIS — D509 Iron deficiency anemia, unspecified: Secondary | ICD-10-CM | POA: Diagnosis not present

## 2016-06-16 DIAGNOSIS — R112 Nausea with vomiting, unspecified: Secondary | ICD-10-CM | POA: Diagnosis not present

## 2016-06-17 ENCOUNTER — Ambulatory Visit (INDEPENDENT_AMBULATORY_CARE_PROVIDER_SITE_OTHER): Payer: Medicare Other | Admitting: Family Medicine

## 2016-06-17 ENCOUNTER — Ambulatory Visit: Payer: Self-pay | Admitting: Family Medicine

## 2016-06-17 VITALS — BP 123/74 | HR 115 | Temp 98.4°F | Resp 16 | Ht 71.0 in | Wt 266.0 lb

## 2016-06-17 DIAGNOSIS — E118 Type 2 diabetes mellitus with unspecified complications: Secondary | ICD-10-CM

## 2016-06-17 DIAGNOSIS — L02412 Cutaneous abscess of left axilla: Secondary | ICD-10-CM | POA: Diagnosis not present

## 2016-06-17 DIAGNOSIS — B351 Tinea unguium: Secondary | ICD-10-CM

## 2016-06-17 DIAGNOSIS — M25561 Pain in right knee: Secondary | ICD-10-CM | POA: Diagnosis not present

## 2016-06-17 DIAGNOSIS — M25562 Pain in left knee: Secondary | ICD-10-CM

## 2016-06-17 DIAGNOSIS — G8929 Other chronic pain: Secondary | ICD-10-CM

## 2016-06-17 MED ORDER — CEPHALEXIN 250 MG PO CAPS: 250.0000 mg | ORAL_CAPSULE | Freq: Two times a day (BID) | ORAL | 0 refills | Status: AC

## 2016-06-17 MED ORDER — BETAMETHASONE DIPROPIONATE 0.05 % EX CREA: TOPICAL_CREAM | Freq: Two times a day (BID) | CUTANEOUS | 0 refills | Status: DC

## 2016-06-17 MED ORDER — DICLOFENAC SODIUM 1 % TD GEL
4.0000 g | Freq: Four times a day (QID) | TRANSDERMAL | 5 refills | Status: DC
Start: 2016-06-17 — End: 2017-03-12

## 2016-06-17 NOTE — Patient Instructions (Addendum)
Cutaneous abscess of left axilla: Keflex 250 mg every 12 hours per renal adjustments.  Apply warm, moist compresses to left axilla 4 times daily as needed.   Diabetic shoes: Will get forms from podiatry for diabetic shoes.   Onychomycosis: Will start betamethasone twice daily to nailbeds    Will follow up in 1 week for abscess only.  Skin Abscess A skin abscess is an infected area on or under your skin that contains pus and other material. An abscess can happen almost anywhere on your body. Some abscesses break open (rupture) on their own. Most continue to get worse unless they are treated. The infection can spread deeper into the body and into your blood, which can make you feel sick. Treatment usually involves draining the abscess. Follow these instructions at home: Abscess Care   If you have an abscess that has not drained, place a warm, clean, wet washcloth over the abscess several times a day. Do this as told by your doctor.  Follow instructions from your doctor about how to take care of your abscess. Make sure you:  Cover the abscess with a bandage (dressing).  Change your bandage or gauze as told by your doctor.  Wash your hands with soap and water before you change the bandage or gauze. If you cannot use soap and water, use hand sanitizer.  Check your abscess every day for signs that the infection is getting worse. Check for:  More redness, swelling, or pain.  More fluid or blood.  Warmth.  More pus or a bad smell. Medicines    Take over-the-counter and prescription medicines only as told by your doctor.  If you were prescribed an antibiotic medicine, take it as told by your doctor. Do not stop taking the antibiotic even if you start to feel better. General instructions   To avoid spreading the infection:  Do not share personal care items, towels, or hot tubs with others.  Avoid making skin-to-skin contact with other people.  Keep all follow-up visits as told by  your doctor. This is important. Contact a doctor if:  You have more redness, swelling, or pain around your abscess.  You have more fluid or blood coming from your abscess.  Your abscess feels warm when you touch it.  You have more pus or a bad smell coming from your abscess.  You have a fever.  Your muscles ache.  You have chills.  You feel sick. Get help right away if:  You have very bad (severe) pain.  You see red streaks on your skin spreading away from the abscess. This information is not intended to replace advice given to you by your health care provider. Make sure you discuss any questions you have with your health care provider. Document Released: 07/09/2007 Document Revised: 09/16/2015 Document Reviewed: 11/29/2014 Elsevier Interactive Patient Education  2017 [employer].

## 2016-06-18 ENCOUNTER — Ambulatory Visit (INDEPENDENT_AMBULATORY_CARE_PROVIDER_SITE_OTHER): Payer: Medicare Other | Admitting: Urology

## 2016-06-18 ENCOUNTER — Encounter (HOSPITAL_COMMUNITY): Payer: Self-pay | Admitting: Emergency Medicine

## 2016-06-18 ENCOUNTER — Emergency Department (HOSPITAL_COMMUNITY)
Admission: EM | Admit: 2016-06-18 | Discharge: 2016-06-18 | Disposition: A | Discharge status: Home / Self Care | Payer: Medicare Other | Attending: Emergency Medicine | Admitting: Emergency Medicine

## 2016-06-18 DIAGNOSIS — Y69 Unspecified misadventure during surgical and medical care: Secondary | ICD-10-CM | POA: Insufficient documentation

## 2016-06-18 DIAGNOSIS — Z992 Dependence on renal dialysis: Secondary | ICD-10-CM | POA: Diagnosis not present

## 2016-06-18 DIAGNOSIS — E119 Type 2 diabetes mellitus without complications: Secondary | ICD-10-CM | POA: Insufficient documentation

## 2016-06-18 DIAGNOSIS — Z79899 Other long term (current) drug therapy: Secondary | ICD-10-CM | POA: Insufficient documentation

## 2016-06-18 DIAGNOSIS — T8249XA Other complication of vascular dialysis catheter, initial encounter: Secondary | ICD-10-CM | POA: Insufficient documentation

## 2016-06-18 DIAGNOSIS — I120 Hypertensive chronic kidney disease with stage 5 chronic kidney disease or end stage renal disease: Secondary | ICD-10-CM | POA: Diagnosis not present

## 2016-06-18 DIAGNOSIS — N186 End stage renal disease: Secondary | ICD-10-CM | POA: Insufficient documentation

## 2016-06-18 DIAGNOSIS — N499 Inflammatory disorder of unspecified male genital organ: Secondary | ICD-10-CM

## 2016-06-18 DIAGNOSIS — Z789 Other specified health status: Secondary | ICD-10-CM

## 2016-06-18 DIAGNOSIS — Z452 Encounter for adjustment and management of vascular access device: Secondary | ICD-10-CM | POA: Diagnosis not present

## 2016-06-18 NOTE — Discharge Instructions (Signed)
Call your vascular doctor to let them know that your dialysis catheter spontaneously came out.  Return here or see your doctor as needed for problems.  Go to dialysis tomorrow as scheduled.

## 2016-06-18 NOTE — ED Notes (Signed)
Patient reports of chest hurting at port site. MD made aware.

## 2016-06-18 NOTE — ED Notes (Signed)
Patient given water and cackers.

## 2016-06-18 NOTE — Progress Notes (Signed)
Subjective:    Patient ID: Jack Irwin, male    DOB: [birth year], 51 y.o.   MRN: [id number]  HPI Mr. Jack Irwin, a 51 year old male with a history of end stage renal disease, type 2 diabetes mellitus, gouty arthritis, and hypertension presents for a follow up of chronic conditions.  Mr. Jack Irwin has end stage renal disease with dialysis on Monday, Wednesday, and Friday at [facility]. Mr. Jack Irwin has a history of type 2 diabetes mellitus. He has been controlling blood sugar with diet.  Patient denies increase appetite, nausea, polydipsia, polyuria, visual disturbances, vomitting and weight loss. He was recently evaluated in podiatry for onychomychosis and toenail thickening. He also endorses bilateral foot pain. Mr. Jack Irwin is requesting diabetic shoes. He says that the majority of his shoes are ill fitting and he typically experiences foot pain after wearing shoes all day.  He is also complaining of an abscess to left axilla for 2 weeks. He describes abscess as tender to touch and non draining.  He has not attempted any OTC interventions to alleviate symptoms.  Past Medical History:  Diagnosis Date  . Anemia   . Arthritis   . BPH (benign prostatic hyperplasia)   . Chronic back pain   . Difficulty walking   . DVT (deep venous thrombosis) ([place])    Right popliteal DVT December 2017  . End-stage renal disease on hemodialysis ([place])    Dr. Lowanda Foster  . Erectile dysfunction   . Essential hypertension   . Gout   . Neuropathy, diabetic ([place])   . Type 2 diabetes mellitus ([place])   . Venous (peripheral) insufficiency    Social History   Social History  . Marital status: Married    Spouse name: N/A  . Number of children: N/A  . Years of education: N/A   Occupational History  . Not on file.   Social History Main Topics  . Smoking status: Never Smoker  . Smokeless tobacco: Never Used  . Alcohol use No  . Drug use: No  . Sexual activity: Not on file   Other  Topics Concern  . Not on file   Social History Narrative  . No narrative on file   Immunization History  Administered Date(s) Administered  . PPD Test 02/17/2014   Allergies  Allergen Reactions  . Doxycycline Itching   Review of Systems  Constitutional: Negative.  Negative for unexpected weight change.  HENT: Negative.   Eyes: Negative.  Negative for visual disturbance.  Respiratory: Negative.   Cardiovascular: Negative.  Negative for chest pain, palpitations and leg swelling.  Gastrointestinal: Negative.   Genitourinary:       End stage renal failure with dialysis  Musculoskeletal: Positive for arthralgias.       Bilateral foot pain  Skin: Negative.        Toe nail thickening  Abscess to left axilla  Allergic/Immunologic: Negative.   Neurological: Positive for numbness.  Hematological: Negative.   Psychiatric/Behavioral: Negative.        Objective:   Physical Exam  Constitutional: He is oriented to person, place, and time.  HENT:  Head: Normocephalic and atraumatic.  Right Ear: External ear normal.  Left Ear: External ear normal.  Nose: Nose normal.  Mouth/Throat: Oropharynx is clear and moist.  Eyes: Conjunctivae and EOM are normal. Pupils are equal, round, and reactive to light.  Neck: Normal range of motion and full passive range of motion without pain. Neck supple.  Pulmonary/Chest: Effort normal and breath sounds  normal.  Abdominal: Soft. Bowel sounds are normal.  Musculoskeletal:       Right wrist: He exhibits decreased range of motion and tenderness.       Left wrist: He exhibits decreased range of motion and tenderness.  Swan neck deformities to phalanges  Neurological: He is alert and oriented to person, place, and time. He has normal reflexes.  Skin: Skin is warm and dry.  Left chest port.    Generalized dryness and flaking   Left axilla:  Abscess, induration, tender to palpation, movable, nondraining.   Psychiatric: He has a normal mood and  affect. His behavior is normal. Judgment and thought content normal.      BP 123/74 (BP Location: Left Arm, Patient Position: Sitting, Cuff Size: Large)   Pulse (!) 115   Temp 98.4 F (36.9 C) (Oral)   Resp 16   Ht 5\' 11"  (1.803 m)   Wt 266 lb (120.7 kg)   SpO2 98%   BMI 37.10 kg/m  Assessment & Plan:  1. Type 2 diabetes mellitus with complication, unspecified whether long term insulin use ([place]) Patient is requesting diabetic shoes.  2. Onychomycosis - betamethasone dipropionate (DIPROLENE) 0.05 % cream; Apply topically 2 (two) times daily.  Dispense: 30 g; Refill: 0  3. Chronic pain of both knees - diclofenac sodium (VOLTAREN) 1 % GEL; Apply 4 g topically 4 (four) times daily.  Dispense: 100 g; Refill: 5  4. Cutaneous abscess of left axilla Patient has ESRD, medication renally adjusted.  - cephALEXin (KEFLEX) 250 MG capsule; Take 1 capsule (250 mg total) by mouth 2 (two) times daily. (Patient not taking: Reported on 06/18/2016)  Dispense: 14 capsule; Refill: 0    Note: I will defer to podiatry for diabetic shoes. Notes indicate that patient will be contacted to measure for shoes.     RTC: Follow up in clinic as previously scheduled    [name]  MSN, FNP-C [facility] [address] [place], [place] [address] [phone]

## 2016-06-18 NOTE — ED Provider Notes (Signed)
[facility] DEPT Provider Note   CSN: [id number] Arrival date & time: 06/18/16  1238     History   Chief Complaint Chief Complaint  Patient presents with  . Vascular Access Problem    HPI Jack Irwin is a 51 y.o. male.  He presents for evaluation of dialysis catheter falling out.  He was due to have it removed tomorrow.  Today while he sat up, from supine position, he noticed that the catheter had fallen out.  There was blood from the catheter ostomy.  He put some pressure on it and it helped.  He last dialyzed 2 days ago and plans on dialyzing tomorrow.  They are using a fistula, and his right upper arm.  He skipped his appointment today to go to a urologist appointment at 830 this morning.  He denies shortness of breath, chest pain, weakness or dizziness.  There are no other known modifying factors.  HPI  Past Medical History:  Diagnosis Date  . Anemia   . Arthritis   . BPH (benign prostatic hyperplasia)   . Chronic back pain   . Difficulty walking   . DVT (deep venous thrombosis) ([place])    Right popliteal DVT December 2017  . End-stage renal disease on hemodialysis ([place])    Dr. Lowanda Foster  . Erectile dysfunction   . Essential hypertension   . Gout   . Neuropathy, diabetic ([place])   . Type 2 diabetes mellitus ([place])   . Venous (peripheral) insufficiency     Patient Active Problem List   Diagnosis Date Noted  . Onychomycosis 06/17/2016  . Constipation 03/05/2016  . Anemia in chronic kidney disease (CKD) 03/05/2016  . Bleeding from wound 03/03/2016  . Chronic anticoagulation 03/03/2016  . Scrotal abscess 02/25/2016  . Type 2 diabetes mellitus ([place]) 02/25/2016  . Acute venous embolism and thrombosis of deep vessels of proximal lower extremity ([place]) [I82.4Y9] 02/11/2016  . Encounter for therapeutic drug monitoring 02/11/2016  . Nausea & vomiting 11/17/2014  . Thrombocytopenia ([employer]) 02/21/2014  . Morbid obesity ([place]) 03/30/2013  . RBBB 03/29/2013  .  Hyperkalemia 03/28/2013  . Leukocytosis 03/28/2013  . Nausea vomiting and diarrhea 03/28/2013  . BBB (bundle branch block) 03/28/2013  . Hypertension   . ESRD on dialysis ([place])   . Lack of coordination 04/15/2012  . Muscle weakness (generalized) 04/15/2012  . Arthritis, gouty 04/15/2012  . Difficulty in walking(719.7) 08/18/2011  . Weakness of both legs 08/18/2011    Past Surgical History:  Procedure Laterality Date  . AV FISTULA PLACEMENT Left 03/09/2014   Procedure: INSERTION OF ARTERIOVENOUS (AV) GORE-TEX GRAFT ARM;  Surgeon: Angelia Mould, MD;  Location: [facility] OR;  Service: Vascular;  Laterality: Left;  . AV FISTULA PLACEMENT Right 05/06/2016   Procedure: INSERTION OF ARTERIOVENOUS (AV) GORE-TEX GRAFT  Right ARM;  Surgeon: Elam Dutch, MD;  Location: [facility];  Service: Vascular;  Laterality: Right;  . CYST EXCISION Right    cyst removed on thumb 2001  . INSERTION OF DIALYSIS CATHETER    . INSERTION OF DIALYSIS CATHETER Left 03/09/2014   Procedure: INSERTION OF DIALYSIS CATHETER;  Surgeon: Angelia Mould, MD;  Location: [facility];  Service: Vascular;  Laterality: Left;  . IRRIGATION AND DEBRIDEMENT ABSCESS N/A 02/25/2016   Procedure: IRRIGATION AND DEBRIDEMENT SCROTAL ABSCESS;  Surgeon: Nickie Retort, MD;  Location: [facility]S;  Service: Urology;  Laterality: N/A;       Home Medications    Prior to Admission medications   Medication Sig  Start Date End Date Taking? Authorizing Provider  betamethasone dipropionate (DIPROLENE) 0.05 % cream Apply topically 2 (two) times daily. 06/17/16  Yes Dorena Dew, FNP  colchicine 0.6 MG tablet Take 0.5 tablets (0.3 mg total) by mouth daily as needed (for gout). 05/06/16  Yes Virgina Jock A, PA-C  diclofenac sodium (VOLTAREN) 1 % GEL Apply 4 g topically 4 (four) times daily. 06/17/16  Yes Dorena Dew, FNP  gabapentin (NEURONTIN) 100 MG capsule Take 100 mg by mouth daily.  07/11/14  Yes [provider]  Multiple  Vitamin (DAILY VITE PO) Take 1 tablet by mouth daily.   Yes [provider]  Olopatadine HCl 0.2 % SOLN Apply 1 drop to eye daily. 05/12/16  Yes Dorena Dew, FNP  oxyCODONE-acetaminophen (PERCOCET/ROXICET) 5-325 MG tablet Take 1 tablet by mouth every 6 (six) hours as needed. 05/06/16  Yes Virgina Jock A, PA-C  sildenafil (REVATIO) 20 MG tablet Take 1 tablet (20 mg total) by mouth daily as needed. Dissolved under tongue (TROCHE) 60 minutes prior to sexual encounter 05/12/16  Yes Dorena Dew, FNP  triamcinolone cream (KENALOG) 0.1 % Apply 1 application topically 2 (two) times daily as needed.    Yes [provider]  ULORIC 80 MG TABS Take 80 mg by mouth daily. 09/26/14  Yes [provider]  cephALEXin (KEFLEX) 250 MG capsule Take 1 capsule (250 mg total) by mouth 2 (two) times daily. Patient not taking: Reported on 06/18/2016 06/17/16 06/24/16  Dorena Dew, FNP  cetirizine (ZYRTEC) 10 MG tablet Take 1 tablet (10 mg total) by mouth daily. Patient not taking: Reported on 06/18/2016 05/12/16   Dorena Dew, FNP  sevelamer carbonate (RENVELA) 800 MG tablet Take 1 tablet (800 mg total) by mouth 2 (two) times daily between meals as needed (With snacks). 02/29/16   Cristal Ford, DO    Family History Family History  Problem Relation Age of Onset  . Diabetes Mother   . Hypertension Mother   . Heart failure Mother   . Hyperlipidemia Mother   . Heart attack Mother   . Cancer Father   . Diabetes Father   . Hypertension Father   . Hyperlipidemia Father     Social History Social History  Substance Use Topics  . Smoking status: Never Smoker  . Smokeless tobacco: Never Used  . Alcohol use No     Allergies   Doxycycline   Review of Systems Review of Systems   Physical Exam Updated Vital Signs BP 101/73 (BP Location: Left Wrist)   Pulse 90   Temp 98 F (36.7 C) (Oral)   Resp 15   Ht 5\' 11"  (1.803 m)   Wt 266 lb (120.7 kg)   SpO2 97%   BMI  37.10 kg/m   Physical Exam  Constitutional: He is oriented to person, place, and time. He appears well-developed and well-nourished. No distress.  HENT:  Head: Normocephalic and atraumatic.  Right Ear: External ear normal.  Left Ear: External ear normal.  Eyes: Conjunctivae and EOM are normal. Pupils are equal, round, and reactive to light.  Neck: Normal range of motion and phonation normal. Neck supple.  Cardiovascular: Normal rate, regular rhythm and normal heart sounds.   Catheter site left upper chest wall, not actively bleeding and there is no associated swelling deformity or tenderness.  Catheter which fell out was examined and appears to be fully intact, from head to tail.  There is a small amount of blood in the catheter  tip, but no apparent exudate, or tissue adherent.  Pulmonary/Chest: Effort normal and breath sounds normal. He exhibits no bony tenderness.  Musculoskeletal: Normal range of motion.  Neurological: He is alert and oriented to person, place, and time. No cranial nerve deficit or sensory deficit. He exhibits normal muscle tone. Coordination normal.  Skin: Skin is warm, dry and intact.  Psychiatric: He has a normal mood and affect. His behavior is normal. Judgment and thought content normal.  Nursing note and vitals reviewed.    ED Treatments / Results  Labs (all labs ordered are listed, but only abnormal results are displayed) Labs Reviewed - No data to display  EKG  EKG Interpretation None       Radiology No results found.  Procedures Procedures (including critical care time)  Medications Ordered in ED Medications - No data to display   Initial Impression / Assessment and Plan / ED Course  I have reviewed the triage vital signs and the nursing notes.  Pertinent labs & imaging results that were available during my care of the patient were reviewed by me and considered in my medical decision making (see chart for details).     Medications - No  data to display  Patient Vitals for the past 24 hrs:  BP Temp Temp src Pulse Resp SpO2 Height Weight  06/18/16 1626 101/73 - - 90 15 97 % - -  06/18/16 1600 104/82 - - 77 - 98 % - -  06/18/16 1530 102/76 - - 87 - 96 % - -  06/18/16 1430 119/69 - - 88 - 97 % - -  06/18/16 1415 108/82 - - 93 - 96 % - -  06/18/16 1330 96/77 - - 89 - 97 % - -  06/18/16 1248 103/77 98 F (36.7 C) Oral 97 20 96 % 5\' 11"  (1.803 m) 266 lb (120.7 kg)    5:29 PM Reevaluation with update and discussion. After initial assessment and treatment, an updated evaluation reveals he is comfortable, without pain with reassuring vitals.  Findings discussed with the patient and all questions answered. Francessca Friis L    Final Clinical Impressions(s) / ED Diagnoses   Final diagnoses:  Problem with vascular access   Dialysis catheter, left chest wall, spontaneously expelled.  No significant bleeding observed after 90 minutes from it falling out.  Patient is hemodynamically stable.  Doubt pulmonary edema, or impending vascular collapse.  Nursing Notes Reviewed/ Care Coordinated Applicable Imaging Reviewed Interpretation of Laboratory Data incorporated into ED treatment  The patient appears reasonably screened and/or stabilized for discharge and I doubt any other medical condition or other [facility] requiring further screening, evaluation, or treatment in the ED at this time prior to discharge.  Plan: Home Medications-continue usual medications; Home Treatments-rest; return here if the recommended treatment, does not improve the symptoms; Recommended follow up-dialysis tomorrow as scheduled, PCP, as needed  New Prescriptions New Prescriptions   No medications on file     Daleen Bo, MD 06/18/16 1730

## 2016-06-18 NOTE — ED Triage Notes (Signed)
Pt reports dialysis catheter from L chest fell out today appx 10 minutes ago and has small amount of bleeding from site. Pt is currently holding pressure to site.  Pt was supposed to have it removed tomorrow. Pt alert and oriented.

## 2016-06-19 DIAGNOSIS — R112 Nausea with vomiting, unspecified: Secondary | ICD-10-CM | POA: Diagnosis not present

## 2016-06-19 DIAGNOSIS — N186 End stage renal disease: Secondary | ICD-10-CM | POA: Diagnosis not present

## 2016-06-19 DIAGNOSIS — N2581 Secondary hyperparathyroidism of renal origin: Secondary | ICD-10-CM | POA: Diagnosis not present

## 2016-06-19 DIAGNOSIS — D509 Iron deficiency anemia, unspecified: Secondary | ICD-10-CM | POA: Diagnosis not present

## 2016-06-19 DIAGNOSIS — Z992 Dependence on renal dialysis: Secondary | ICD-10-CM | POA: Diagnosis not present

## 2016-06-20 DIAGNOSIS — Z992 Dependence on renal dialysis: Secondary | ICD-10-CM | POA: Diagnosis not present

## 2016-06-20 DIAGNOSIS — N2581 Secondary hyperparathyroidism of renal origin: Secondary | ICD-10-CM | POA: Diagnosis not present

## 2016-06-20 DIAGNOSIS — D509 Iron deficiency anemia, unspecified: Secondary | ICD-10-CM | POA: Diagnosis not present

## 2016-06-20 DIAGNOSIS — N186 End stage renal disease: Secondary | ICD-10-CM | POA: Diagnosis not present

## 2016-06-20 DIAGNOSIS — R112 Nausea with vomiting, unspecified: Secondary | ICD-10-CM | POA: Diagnosis not present

## 2016-06-22 IMAGING — CR DG CHEST 1V PORT
1 series · 1 of 1 positions shown · non-contrast
Comparison: 07/24/2014

CLINICAL DATA: Chest pain.  Missed 5 dialysis sessions.

EXAM:
PORTABLE CHEST 1 VIEW

[ap]
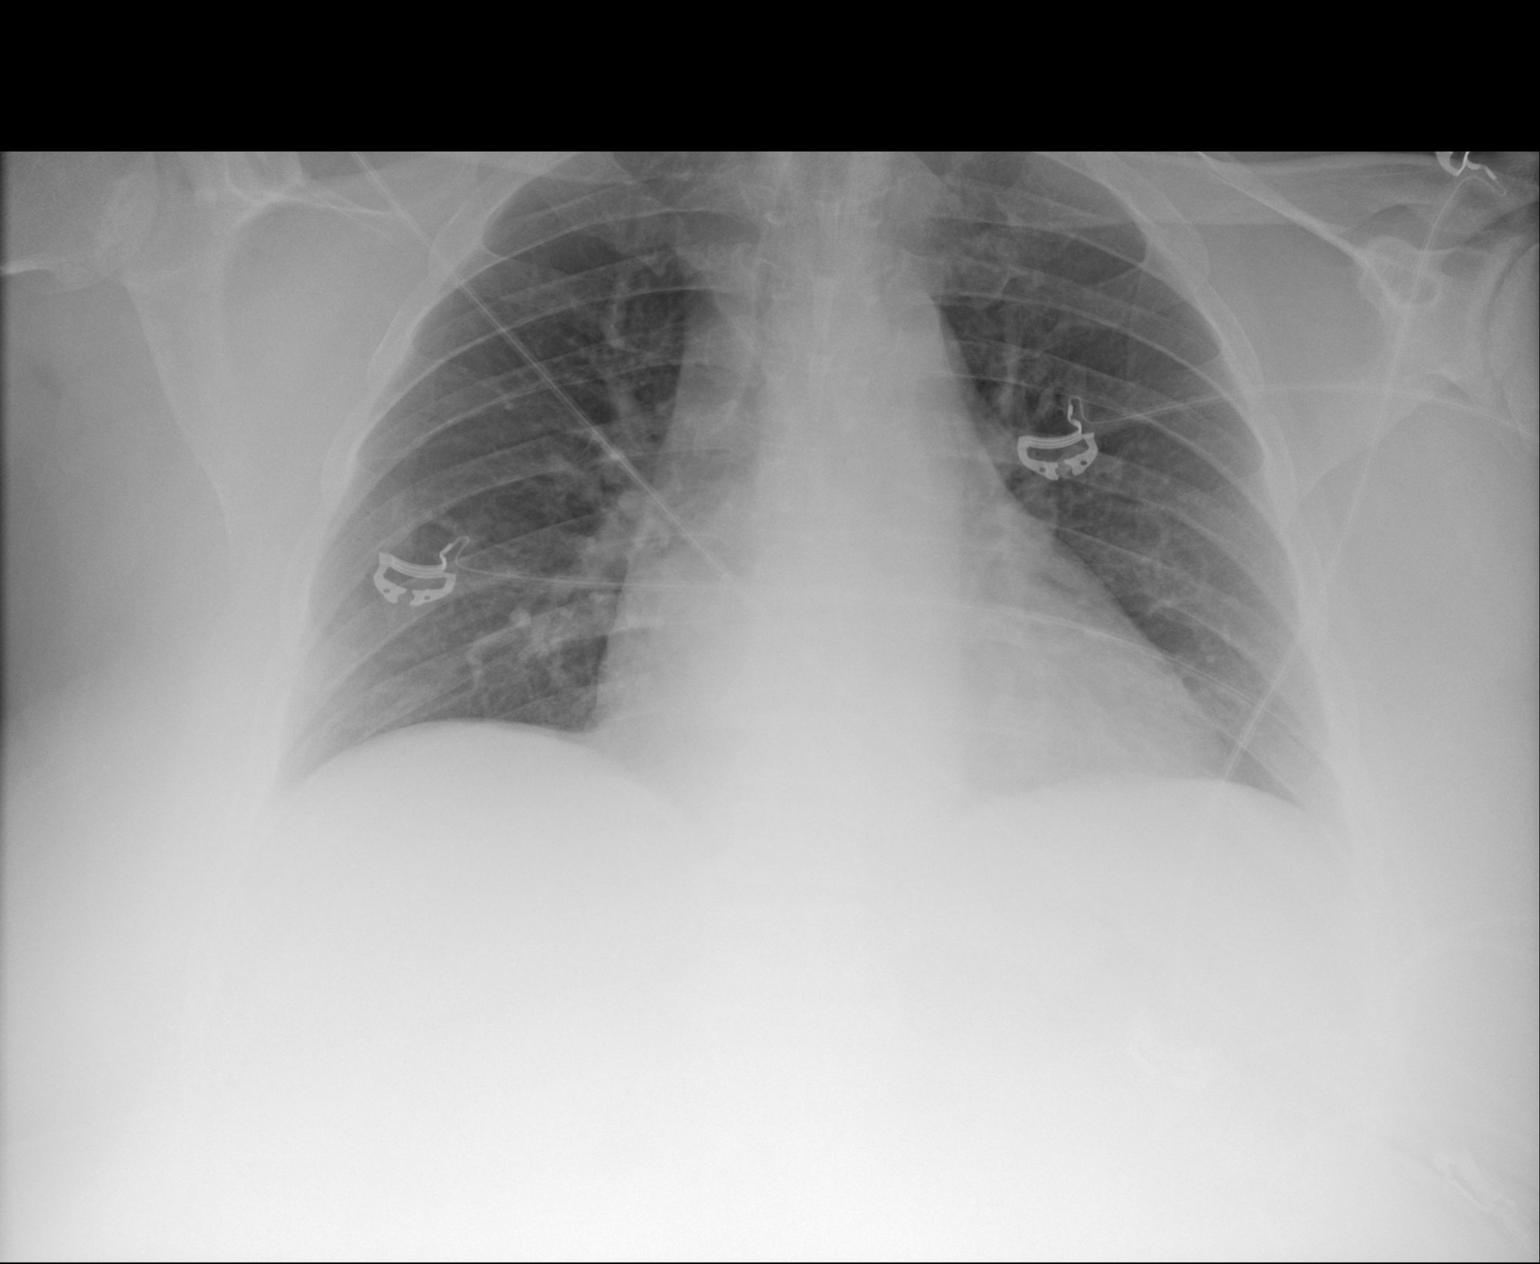

[1 of 1 positions shown; findings below may reference images not displayed]

FINDINGS: Heart at the upper limits normal in size. Suspect vascular
congestion but no overt pulmonary edema. No consolidation, pleural
effusion, or pneumothorax. No acute osseous abnormalities are seen.
Degenerative change in the shoulders, left greater than right,
partially included.
IMPRESSION: Borderline cardiomegaly with vascular congestion.

## 2016-06-23 ENCOUNTER — Encounter: Payer: Self-pay | Admitting: Family Medicine

## 2016-06-23 DIAGNOSIS — R112 Nausea with vomiting, unspecified: Secondary | ICD-10-CM | POA: Diagnosis not present

## 2016-06-23 DIAGNOSIS — Z992 Dependence on renal dialysis: Secondary | ICD-10-CM | POA: Diagnosis not present

## 2016-06-23 DIAGNOSIS — D509 Iron deficiency anemia, unspecified: Secondary | ICD-10-CM | POA: Diagnosis not present

## 2016-06-23 DIAGNOSIS — N2581 Secondary hyperparathyroidism of renal origin: Secondary | ICD-10-CM | POA: Diagnosis not present

## 2016-06-23 DIAGNOSIS — N186 End stage renal disease: Secondary | ICD-10-CM | POA: Diagnosis not present

## 2016-06-25 DIAGNOSIS — N186 End stage renal disease: Secondary | ICD-10-CM | POA: Diagnosis not present

## 2016-06-25 DIAGNOSIS — D509 Iron deficiency anemia, unspecified: Secondary | ICD-10-CM | POA: Diagnosis not present

## 2016-06-25 DIAGNOSIS — R112 Nausea with vomiting, unspecified: Secondary | ICD-10-CM | POA: Diagnosis not present

## 2016-06-25 DIAGNOSIS — Z992 Dependence on renal dialysis: Secondary | ICD-10-CM | POA: Diagnosis not present

## 2016-06-25 DIAGNOSIS — N2581 Secondary hyperparathyroidism of renal origin: Secondary | ICD-10-CM | POA: Diagnosis not present

## 2016-06-26 ENCOUNTER — Ambulatory Visit: Payer: Self-pay | Admitting: Family Medicine

## 2016-06-27 ENCOUNTER — Telehealth: Payer: Self-pay | Admitting: *Deleted

## 2016-06-27 DIAGNOSIS — N2581 Secondary hyperparathyroidism of renal origin: Secondary | ICD-10-CM | POA: Diagnosis not present

## 2016-06-27 DIAGNOSIS — Z992 Dependence on renal dialysis: Secondary | ICD-10-CM | POA: Diagnosis not present

## 2016-06-27 DIAGNOSIS — N186 End stage renal disease: Secondary | ICD-10-CM | POA: Diagnosis not present

## 2016-06-27 DIAGNOSIS — R112 Nausea with vomiting, unspecified: Secondary | ICD-10-CM | POA: Diagnosis not present

## 2016-06-27 DIAGNOSIS — D509 Iron deficiency anemia, unspecified: Secondary | ICD-10-CM | POA: Diagnosis not present

## 2016-06-27 NOTE — Telephone Encounter (Addendum)
Pt asked for information concerning his diabetic shoes. I told pt I would send a message to the man that took care of that area and he would call again next week.07/14/2016-Pt left name and phone number, but was difficult to understand the message. I left message to call again so I could help. Pt left name and request for call back. I left message that I was returning his call. 07/15/2016-I spoke with pt and he states he was calling about his shoes. I told him I would forward a message to the man that took care of the shoes. Routed message to R. Puckett for diabetic shoe status.

## 2016-06-30 DIAGNOSIS — R112 Nausea with vomiting, unspecified: Secondary | ICD-10-CM | POA: Diagnosis not present

## 2016-06-30 DIAGNOSIS — D509 Iron deficiency anemia, unspecified: Secondary | ICD-10-CM | POA: Diagnosis not present

## 2016-06-30 DIAGNOSIS — N2581 Secondary hyperparathyroidism of renal origin: Secondary | ICD-10-CM | POA: Diagnosis not present

## 2016-06-30 DIAGNOSIS — Z992 Dependence on renal dialysis: Secondary | ICD-10-CM | POA: Diagnosis not present

## 2016-06-30 DIAGNOSIS — N186 End stage renal disease: Secondary | ICD-10-CM | POA: Diagnosis not present

## 2016-07-01 ENCOUNTER — Ambulatory Visit: Payer: Self-pay | Admitting: Family Medicine

## 2016-07-02 DIAGNOSIS — Z992 Dependence on renal dialysis: Secondary | ICD-10-CM | POA: Diagnosis not present

## 2016-07-02 DIAGNOSIS — N186 End stage renal disease: Secondary | ICD-10-CM | POA: Diagnosis not present

## 2016-07-02 DIAGNOSIS — D509 Iron deficiency anemia, unspecified: Secondary | ICD-10-CM | POA: Diagnosis not present

## 2016-07-02 DIAGNOSIS — N2581 Secondary hyperparathyroidism of renal origin: Secondary | ICD-10-CM | POA: Diagnosis not present

## 2016-07-02 DIAGNOSIS — R112 Nausea with vomiting, unspecified: Secondary | ICD-10-CM | POA: Diagnosis not present

## 2016-07-03 DIAGNOSIS — N186 End stage renal disease: Secondary | ICD-10-CM | POA: Diagnosis not present

## 2016-07-03 DIAGNOSIS — Z992 Dependence on renal dialysis: Secondary | ICD-10-CM | POA: Diagnosis not present

## 2016-07-04 DIAGNOSIS — R112 Nausea with vomiting, unspecified: Secondary | ICD-10-CM | POA: Diagnosis not present

## 2016-07-04 DIAGNOSIS — N2581 Secondary hyperparathyroidism of renal origin: Secondary | ICD-10-CM | POA: Diagnosis not present

## 2016-07-04 DIAGNOSIS — Z992 Dependence on renal dialysis: Secondary | ICD-10-CM | POA: Diagnosis not present

## 2016-07-04 DIAGNOSIS — N186 End stage renal disease: Secondary | ICD-10-CM | POA: Diagnosis not present

## 2016-07-04 DIAGNOSIS — D631 Anemia in chronic kidney disease: Secondary | ICD-10-CM | POA: Diagnosis not present

## 2016-07-04 DIAGNOSIS — D509 Iron deficiency anemia, unspecified: Secondary | ICD-10-CM | POA: Diagnosis not present

## 2016-07-07 DIAGNOSIS — R112 Nausea with vomiting, unspecified: Secondary | ICD-10-CM | POA: Diagnosis not present

## 2016-07-07 DIAGNOSIS — N2581 Secondary hyperparathyroidism of renal origin: Secondary | ICD-10-CM | POA: Diagnosis not present

## 2016-07-07 DIAGNOSIS — N186 End stage renal disease: Secondary | ICD-10-CM | POA: Diagnosis not present

## 2016-07-07 DIAGNOSIS — D509 Iron deficiency anemia, unspecified: Secondary | ICD-10-CM | POA: Diagnosis not present

## 2016-07-07 DIAGNOSIS — Z992 Dependence on renal dialysis: Secondary | ICD-10-CM | POA: Diagnosis not present

## 2016-07-07 DIAGNOSIS — D631 Anemia in chronic kidney disease: Secondary | ICD-10-CM | POA: Diagnosis not present

## 2016-07-09 DIAGNOSIS — N2581 Secondary hyperparathyroidism of renal origin: Secondary | ICD-10-CM | POA: Diagnosis not present

## 2016-07-09 DIAGNOSIS — D631 Anemia in chronic kidney disease: Secondary | ICD-10-CM | POA: Diagnosis not present

## 2016-07-09 DIAGNOSIS — D509 Iron deficiency anemia, unspecified: Secondary | ICD-10-CM | POA: Diagnosis not present

## 2016-07-09 DIAGNOSIS — N186 End stage renal disease: Secondary | ICD-10-CM | POA: Diagnosis not present

## 2016-07-09 DIAGNOSIS — R112 Nausea with vomiting, unspecified: Secondary | ICD-10-CM | POA: Diagnosis not present

## 2016-07-09 DIAGNOSIS — Z992 Dependence on renal dialysis: Secondary | ICD-10-CM | POA: Diagnosis not present

## 2016-07-10 ENCOUNTER — Ambulatory Visit: Payer: Self-pay | Admitting: Family Medicine

## 2016-07-11 DIAGNOSIS — Z992 Dependence on renal dialysis: Secondary | ICD-10-CM | POA: Diagnosis not present

## 2016-07-11 DIAGNOSIS — N2581 Secondary hyperparathyroidism of renal origin: Secondary | ICD-10-CM | POA: Diagnosis not present

## 2016-07-11 DIAGNOSIS — R112 Nausea with vomiting, unspecified: Secondary | ICD-10-CM | POA: Diagnosis not present

## 2016-07-11 DIAGNOSIS — D509 Iron deficiency anemia, unspecified: Secondary | ICD-10-CM | POA: Diagnosis not present

## 2016-07-11 DIAGNOSIS — N186 End stage renal disease: Secondary | ICD-10-CM | POA: Diagnosis not present

## 2016-07-11 DIAGNOSIS — D631 Anemia in chronic kidney disease: Secondary | ICD-10-CM | POA: Diagnosis not present

## 2016-07-14 DIAGNOSIS — N186 End stage renal disease: Secondary | ICD-10-CM | POA: Diagnosis not present

## 2016-07-14 DIAGNOSIS — R112 Nausea with vomiting, unspecified: Secondary | ICD-10-CM | POA: Diagnosis not present

## 2016-07-14 DIAGNOSIS — Z992 Dependence on renal dialysis: Secondary | ICD-10-CM | POA: Diagnosis not present

## 2016-07-14 DIAGNOSIS — D509 Iron deficiency anemia, unspecified: Secondary | ICD-10-CM | POA: Diagnosis not present

## 2016-07-14 DIAGNOSIS — N2581 Secondary hyperparathyroidism of renal origin: Secondary | ICD-10-CM | POA: Diagnosis not present

## 2016-07-14 DIAGNOSIS — D631 Anemia in chronic kidney disease: Secondary | ICD-10-CM | POA: Diagnosis not present

## 2016-07-15 ENCOUNTER — Ambulatory Visit: Payer: Self-pay | Admitting: Family Medicine

## 2016-07-16 DIAGNOSIS — D631 Anemia in chronic kidney disease: Secondary | ICD-10-CM | POA: Diagnosis not present

## 2016-07-16 DIAGNOSIS — N186 End stage renal disease: Secondary | ICD-10-CM | POA: Diagnosis not present

## 2016-07-16 DIAGNOSIS — Z992 Dependence on renal dialysis: Secondary | ICD-10-CM | POA: Diagnosis not present

## 2016-07-16 DIAGNOSIS — R112 Nausea with vomiting, unspecified: Secondary | ICD-10-CM | POA: Diagnosis not present

## 2016-07-16 DIAGNOSIS — D509 Iron deficiency anemia, unspecified: Secondary | ICD-10-CM | POA: Diagnosis not present

## 2016-07-16 DIAGNOSIS — N2581 Secondary hyperparathyroidism of renal origin: Secondary | ICD-10-CM | POA: Diagnosis not present

## 2016-07-18 DIAGNOSIS — R112 Nausea with vomiting, unspecified: Secondary | ICD-10-CM | POA: Diagnosis not present

## 2016-07-18 DIAGNOSIS — N2581 Secondary hyperparathyroidism of renal origin: Secondary | ICD-10-CM | POA: Diagnosis not present

## 2016-07-18 DIAGNOSIS — Z992 Dependence on renal dialysis: Secondary | ICD-10-CM | POA: Diagnosis not present

## 2016-07-18 DIAGNOSIS — D509 Iron deficiency anemia, unspecified: Secondary | ICD-10-CM | POA: Diagnosis not present

## 2016-07-18 DIAGNOSIS — D631 Anemia in chronic kidney disease: Secondary | ICD-10-CM | POA: Diagnosis not present

## 2016-07-18 DIAGNOSIS — N186 End stage renal disease: Secondary | ICD-10-CM | POA: Diagnosis not present

## 2016-07-21 ENCOUNTER — Telehealth: Payer: Self-pay

## 2016-07-21 NOTE — Telephone Encounter (Signed)
[place] have you received anything regarding diabetic shoes for this patient? Please advise. Thanks!

## 2016-07-22 DIAGNOSIS — N2581 Secondary hyperparathyroidism of renal origin: Secondary | ICD-10-CM | POA: Diagnosis not present

## 2016-07-22 DIAGNOSIS — D631 Anemia in chronic kidney disease: Secondary | ICD-10-CM | POA: Diagnosis not present

## 2016-07-22 DIAGNOSIS — Z992 Dependence on renal dialysis: Secondary | ICD-10-CM | POA: Diagnosis not present

## 2016-07-22 DIAGNOSIS — N186 End stage renal disease: Secondary | ICD-10-CM | POA: Diagnosis not present

## 2016-07-22 DIAGNOSIS — R112 Nausea with vomiting, unspecified: Secondary | ICD-10-CM | POA: Diagnosis not present

## 2016-07-22 DIAGNOSIS — D509 Iron deficiency anemia, unspecified: Secondary | ICD-10-CM | POA: Diagnosis not present

## 2016-07-23 DIAGNOSIS — Z992 Dependence on renal dialysis: Secondary | ICD-10-CM | POA: Diagnosis not present

## 2016-07-23 DIAGNOSIS — R112 Nausea with vomiting, unspecified: Secondary | ICD-10-CM | POA: Diagnosis not present

## 2016-07-23 DIAGNOSIS — N2581 Secondary hyperparathyroidism of renal origin: Secondary | ICD-10-CM | POA: Diagnosis not present

## 2016-07-23 DIAGNOSIS — N186 End stage renal disease: Secondary | ICD-10-CM | POA: Diagnosis not present

## 2016-07-23 DIAGNOSIS — D631 Anemia in chronic kidney disease: Secondary | ICD-10-CM | POA: Diagnosis not present

## 2016-07-23 DIAGNOSIS — D509 Iron deficiency anemia, unspecified: Secondary | ICD-10-CM | POA: Diagnosis not present

## 2016-07-25 DIAGNOSIS — D509 Iron deficiency anemia, unspecified: Secondary | ICD-10-CM | POA: Diagnosis not present

## 2016-07-25 DIAGNOSIS — N2581 Secondary hyperparathyroidism of renal origin: Secondary | ICD-10-CM | POA: Diagnosis not present

## 2016-07-25 DIAGNOSIS — Z992 Dependence on renal dialysis: Secondary | ICD-10-CM | POA: Diagnosis not present

## 2016-07-25 DIAGNOSIS — D631 Anemia in chronic kidney disease: Secondary | ICD-10-CM | POA: Diagnosis not present

## 2016-07-25 DIAGNOSIS — N186 End stage renal disease: Secondary | ICD-10-CM | POA: Diagnosis not present

## 2016-07-25 DIAGNOSIS — R112 Nausea with vomiting, unspecified: Secondary | ICD-10-CM | POA: Diagnosis not present

## 2016-07-28 DIAGNOSIS — R112 Nausea with vomiting, unspecified: Secondary | ICD-10-CM | POA: Diagnosis not present

## 2016-07-28 DIAGNOSIS — D631 Anemia in chronic kidney disease: Secondary | ICD-10-CM | POA: Diagnosis not present

## 2016-07-28 DIAGNOSIS — N2581 Secondary hyperparathyroidism of renal origin: Secondary | ICD-10-CM | POA: Diagnosis not present

## 2016-07-28 DIAGNOSIS — Z992 Dependence on renal dialysis: Secondary | ICD-10-CM | POA: Diagnosis not present

## 2016-07-28 DIAGNOSIS — D509 Iron deficiency anemia, unspecified: Secondary | ICD-10-CM | POA: Diagnosis not present

## 2016-07-28 DIAGNOSIS — N186 End stage renal disease: Secondary | ICD-10-CM | POA: Diagnosis not present

## 2016-07-30 DIAGNOSIS — N2581 Secondary hyperparathyroidism of renal origin: Secondary | ICD-10-CM | POA: Diagnosis not present

## 2016-07-30 DIAGNOSIS — N186 End stage renal disease: Secondary | ICD-10-CM | POA: Diagnosis not present

## 2016-07-30 DIAGNOSIS — D509 Iron deficiency anemia, unspecified: Secondary | ICD-10-CM | POA: Diagnosis not present

## 2016-07-30 DIAGNOSIS — D631 Anemia in chronic kidney disease: Secondary | ICD-10-CM | POA: Diagnosis not present

## 2016-07-30 DIAGNOSIS — Z992 Dependence on renal dialysis: Secondary | ICD-10-CM | POA: Diagnosis not present

## 2016-07-30 DIAGNOSIS — R112 Nausea with vomiting, unspecified: Secondary | ICD-10-CM | POA: Diagnosis not present

## 2016-07-31 ENCOUNTER — Ambulatory Visit: Payer: Self-pay | Admitting: Family Medicine

## 2016-08-01 DIAGNOSIS — N2581 Secondary hyperparathyroidism of renal origin: Secondary | ICD-10-CM | POA: Diagnosis not present

## 2016-08-01 DIAGNOSIS — N186 End stage renal disease: Secondary | ICD-10-CM | POA: Diagnosis not present

## 2016-08-01 DIAGNOSIS — Z992 Dependence on renal dialysis: Secondary | ICD-10-CM | POA: Diagnosis not present

## 2016-08-01 DIAGNOSIS — D631 Anemia in chronic kidney disease: Secondary | ICD-10-CM | POA: Diagnosis not present

## 2016-08-01 DIAGNOSIS — R112 Nausea with vomiting, unspecified: Secondary | ICD-10-CM | POA: Diagnosis not present

## 2016-08-01 DIAGNOSIS — D509 Iron deficiency anemia, unspecified: Secondary | ICD-10-CM | POA: Diagnosis not present

## 2016-08-02 DIAGNOSIS — Z992 Dependence on renal dialysis: Secondary | ICD-10-CM | POA: Diagnosis not present

## 2016-08-02 DIAGNOSIS — N186 End stage renal disease: Secondary | ICD-10-CM | POA: Diagnosis not present

## 2016-08-04 DIAGNOSIS — R6889 Other general symptoms and signs: Secondary | ICD-10-CM | POA: Diagnosis not present

## 2016-08-04 DIAGNOSIS — N2581 Secondary hyperparathyroidism of renal origin: Secondary | ICD-10-CM | POA: Diagnosis not present

## 2016-08-04 DIAGNOSIS — N186 End stage renal disease: Secondary | ICD-10-CM | POA: Diagnosis not present

## 2016-08-04 DIAGNOSIS — D631 Anemia in chronic kidney disease: Secondary | ICD-10-CM | POA: Diagnosis not present

## 2016-08-06 DIAGNOSIS — D631 Anemia in chronic kidney disease: Secondary | ICD-10-CM | POA: Diagnosis not present

## 2016-08-06 DIAGNOSIS — N186 End stage renal disease: Secondary | ICD-10-CM | POA: Diagnosis not present

## 2016-08-06 DIAGNOSIS — R6889 Other general symptoms and signs: Secondary | ICD-10-CM | POA: Diagnosis not present

## 2016-08-06 DIAGNOSIS — N2581 Secondary hyperparathyroidism of renal origin: Secondary | ICD-10-CM | POA: Diagnosis not present

## 2016-08-07 DIAGNOSIS — N186 End stage renal disease: Secondary | ICD-10-CM | POA: Diagnosis not present

## 2016-08-07 DIAGNOSIS — D631 Anemia in chronic kidney disease: Secondary | ICD-10-CM | POA: Diagnosis not present

## 2016-08-07 DIAGNOSIS — N2581 Secondary hyperparathyroidism of renal origin: Secondary | ICD-10-CM | POA: Diagnosis not present

## 2016-08-07 DIAGNOSIS — R6889 Other general symptoms and signs: Secondary | ICD-10-CM | POA: Diagnosis not present

## 2016-08-08 DIAGNOSIS — D631 Anemia in chronic kidney disease: Secondary | ICD-10-CM | POA: Diagnosis not present

## 2016-08-08 DIAGNOSIS — N186 End stage renal disease: Secondary | ICD-10-CM | POA: Diagnosis not present

## 2016-08-08 DIAGNOSIS — N2581 Secondary hyperparathyroidism of renal origin: Secondary | ICD-10-CM | POA: Diagnosis not present

## 2016-08-08 DIAGNOSIS — R6889 Other general symptoms and signs: Secondary | ICD-10-CM | POA: Diagnosis not present

## 2016-08-11 DIAGNOSIS — Z992 Dependence on renal dialysis: Secondary | ICD-10-CM | POA: Diagnosis not present

## 2016-08-11 DIAGNOSIS — E119 Type 2 diabetes mellitus without complications: Secondary | ICD-10-CM | POA: Diagnosis not present

## 2016-08-11 DIAGNOSIS — N2581 Secondary hyperparathyroidism of renal origin: Secondary | ICD-10-CM | POA: Diagnosis not present

## 2016-08-11 DIAGNOSIS — D509 Iron deficiency anemia, unspecified: Secondary | ICD-10-CM | POA: Diagnosis not present

## 2016-08-11 DIAGNOSIS — N186 End stage renal disease: Secondary | ICD-10-CM | POA: Diagnosis not present

## 2016-08-13 DIAGNOSIS — N186 End stage renal disease: Secondary | ICD-10-CM | POA: Diagnosis not present

## 2016-08-13 DIAGNOSIS — N2581 Secondary hyperparathyroidism of renal origin: Secondary | ICD-10-CM | POA: Diagnosis not present

## 2016-08-13 DIAGNOSIS — D509 Iron deficiency anemia, unspecified: Secondary | ICD-10-CM | POA: Diagnosis not present

## 2016-08-13 DIAGNOSIS — Z992 Dependence on renal dialysis: Secondary | ICD-10-CM | POA: Diagnosis not present

## 2016-08-15 DIAGNOSIS — N2581 Secondary hyperparathyroidism of renal origin: Secondary | ICD-10-CM | POA: Diagnosis not present

## 2016-08-15 DIAGNOSIS — N186 End stage renal disease: Secondary | ICD-10-CM | POA: Diagnosis not present

## 2016-08-15 DIAGNOSIS — D509 Iron deficiency anemia, unspecified: Secondary | ICD-10-CM | POA: Diagnosis not present

## 2016-08-15 DIAGNOSIS — Z992 Dependence on renal dialysis: Secondary | ICD-10-CM | POA: Diagnosis not present

## 2016-08-18 DIAGNOSIS — N186 End stage renal disease: Secondary | ICD-10-CM | POA: Diagnosis not present

## 2016-08-18 DIAGNOSIS — D509 Iron deficiency anemia, unspecified: Secondary | ICD-10-CM | POA: Diagnosis not present

## 2016-08-18 DIAGNOSIS — Z992 Dependence on renal dialysis: Secondary | ICD-10-CM | POA: Diagnosis not present

## 2016-08-18 DIAGNOSIS — N2581 Secondary hyperparathyroidism of renal origin: Secondary | ICD-10-CM | POA: Diagnosis not present

## 2016-08-20 DIAGNOSIS — N2581 Secondary hyperparathyroidism of renal origin: Secondary | ICD-10-CM | POA: Diagnosis not present

## 2016-08-20 DIAGNOSIS — D509 Iron deficiency anemia, unspecified: Secondary | ICD-10-CM | POA: Diagnosis not present

## 2016-08-20 DIAGNOSIS — Z992 Dependence on renal dialysis: Secondary | ICD-10-CM | POA: Diagnosis not present

## 2016-08-20 DIAGNOSIS — N186 End stage renal disease: Secondary | ICD-10-CM | POA: Diagnosis not present

## 2016-08-21 DIAGNOSIS — N2581 Secondary hyperparathyroidism of renal origin: Secondary | ICD-10-CM | POA: Diagnosis not present

## 2016-08-21 DIAGNOSIS — N186 End stage renal disease: Secondary | ICD-10-CM | POA: Diagnosis not present

## 2016-08-21 DIAGNOSIS — Z992 Dependence on renal dialysis: Secondary | ICD-10-CM | POA: Diagnosis not present

## 2016-08-21 DIAGNOSIS — D509 Iron deficiency anemia, unspecified: Secondary | ICD-10-CM | POA: Diagnosis not present

## 2016-08-25 DIAGNOSIS — N186 End stage renal disease: Secondary | ICD-10-CM | POA: Diagnosis not present

## 2016-08-25 DIAGNOSIS — Z992 Dependence on renal dialysis: Secondary | ICD-10-CM | POA: Diagnosis not present

## 2016-08-25 DIAGNOSIS — D509 Iron deficiency anemia, unspecified: Secondary | ICD-10-CM | POA: Diagnosis not present

## 2016-08-25 DIAGNOSIS — N2581 Secondary hyperparathyroidism of renal origin: Secondary | ICD-10-CM | POA: Diagnosis not present

## 2016-08-26 ENCOUNTER — Ambulatory Visit: Payer: Self-pay | Admitting: Family Medicine

## 2016-08-27 ENCOUNTER — Emergency Department (HOSPITAL_COMMUNITY)
Admission: EM | Admit: 2016-08-27 | Discharge: 2016-08-27 | Disposition: A | Discharge status: Home / Self Care | Payer: Medicare Other | Attending: Emergency Medicine | Admitting: Emergency Medicine

## 2016-08-27 ENCOUNTER — Ambulatory Visit: Payer: Self-pay | Admitting: *Deleted

## 2016-08-27 ENCOUNTER — Encounter (HOSPITAL_COMMUNITY): Payer: Self-pay | Admitting: Emergency Medicine

## 2016-08-27 ENCOUNTER — Emergency Department (HOSPITAL_COMMUNITY): Payer: Medicare Other

## 2016-08-27 DIAGNOSIS — E1122 Type 2 diabetes mellitus with diabetic chronic kidney disease: Secondary | ICD-10-CM | POA: Insufficient documentation

## 2016-08-27 DIAGNOSIS — I82401 Acute embolism and thrombosis of unspecified deep veins of right lower extremity: Secondary | ICD-10-CM | POA: Diagnosis not present

## 2016-08-27 DIAGNOSIS — O2230 Deep phlebothrombosis in pregnancy, unspecified trimester: Secondary | ICD-10-CM

## 2016-08-27 DIAGNOSIS — I120 Hypertensive chronic kidney disease with stage 5 chronic kidney disease or end stage renal disease: Secondary | ICD-10-CM | POA: Diagnosis not present

## 2016-08-27 DIAGNOSIS — M79604 Pain in right leg: Secondary | ICD-10-CM | POA: Diagnosis not present

## 2016-08-27 DIAGNOSIS — Z79899 Other long term (current) drug therapy: Secondary | ICD-10-CM | POA: Diagnosis not present

## 2016-08-27 DIAGNOSIS — N186 End stage renal disease: Secondary | ICD-10-CM | POA: Diagnosis not present

## 2016-08-27 DIAGNOSIS — Z992 Dependence on renal dialysis: Secondary | ICD-10-CM | POA: Diagnosis not present

## 2016-08-27 DIAGNOSIS — Z5181 Encounter for therapeutic drug level monitoring: Secondary | ICD-10-CM

## 2016-08-27 MED ORDER — TRAMADOL HCL 50 MG PO TABS
50.0000 mg | ORAL_TABLET | Freq: Once | ORAL | Status: AC
  Administered 2016-08-27: 50 mg via ORAL
  Filled 2016-08-27: qty 1

## 2016-08-27 MED ORDER — SULFAMETHOXAZOLE-TRIMETHOPRIM 800-160 MG PO TABS: 1.0000 | ORAL_TABLET | Freq: Two times a day (BID) | ORAL | 0 refills | Status: AC

## 2016-08-27 MED ORDER — APIXABAN 2.5 MG PO TABS
2.5000 mg | ORAL_TABLET | Freq: Two times a day (BID) | ORAL | Status: DC
  Administered 2016-08-27: 2.5 mg via ORAL
  Filled 2016-08-27 (×5): qty 1

## 2016-08-27 MED ORDER — TRAMADOL HCL 50 MG PO TABS: 50.0000 mg | ORAL_TABLET | Freq: Four times a day (QID) | ORAL | 0 refills | Status: DC | PRN

## 2016-08-27 MED ORDER — SULFAMETHOXAZOLE-TRIMETHOPRIM 800-160 MG PO TABS
1.0000 | ORAL_TABLET | Freq: Once | ORAL | Status: AC
  Administered 2016-08-27: 1 via ORAL
  Filled 2016-08-27: qty 1

## 2016-08-27 MED ORDER — APIXABAN 2.5 MG PO TABS: 2.5000 mg | ORAL_TABLET | Freq: Two times a day (BID) | ORAL | 0 refills | Status: DC

## 2016-08-27 NOTE — Discharge Instructions (Signed)
Follow up with Dr. Talbert Cage in [place] in 2 weeks.  Call [phone] for an appointment.    Follow up with your md in a week for your skin infections  Information on my medicine - ELIQUIS (apixaban)  This medication education was reviewed with me or my healthcare representative as part of my discharge preparation.  The pharmacist that spoke with me during my hospital stay was:  Ena Dawley, [facility]  Why was Eliquis prescribed for you? Eliquis was prescribed for you to reduce the risk of a blood clot forming that can cause a stroke if you have a medical condition called atrial fibrillation (a type of irregular heartbeat).  What do You need to know about Eliquis ? Take your Eliquis TWICE DAILY - one tablet in the morning and one tablet in the evening with or without food. If you have difficulty swallowing the tablet whole please discuss with your pharmacist how to take the medication safely.  Take Eliquis exactly as prescribed by your doctor and DO NOT stop taking Eliquis without talking to the doctor who prescribed the medication.  Stopping may increase your risk of developing a stroke.  Refill your prescription before you run out.  After discharge, you should have regular check-up appointments with your healthcare provider that is prescribing your Eliquis.  In the future your dose may need to be changed if your kidney function or weight changes by a significant amount or as you get older.  What do you do if you miss a dose? If you miss a dose, take it as soon as you remember on the same day and resume taking twice daily.  Do not take more than one dose of ELIQUIS at the same time to make up a missed dose.  Important Safety Information A possible side effect of Eliquis is bleeding. You should call your healthcare provider right away if you experience any of the following: ? Bleeding from an injury or your nose that does not stop. ? Unusual colored urine (red or dark brown) or unusual colored  stools (red or black). ? Unusual bruising for unknown reasons. ? A serious fall or if you hit your head (even if there is no bleeding).  Some medicines may interact with Eliquis and might increase your risk of bleeding or clotting while on Eliquis. To help avoid this, consult your healthcare provider or pharmacist prior to using any new prescription or non-prescription medications, including herbals, vitamins, non-steroidal anti-inflammatory drugs (NSAIDs) and supplements.  This website has more information on Eliquis (apixaban): http://www.eliquis.com/eliquis/home

## 2016-08-27 NOTE — ED Triage Notes (Signed)
Pt c/o drainage to lower abd and bilateral buttocks.

## 2016-08-27 NOTE — ED Provider Notes (Signed)
[facility] DEPT Provider Note   CSN: [id number] Arrival date & time: 08/27/16  8546     History   Chief Complaint Chief Complaint  Patient presents with  . Abscess    HPI KEYON LILLER is a 51 y.o. male.  Patient complains of some sores on his buttocks and his lower abdomen and he also complains of pain in his right leg where he has had a DVT before   The history is provided by the patient.  Leg Pain   This is a new problem. The current episode started 2 days ago. The problem occurs constantly. The problem has not changed since onset.The pain is present in the right lower leg. The quality of the pain is described as aching. The pain is at a severity of 7/10. The pain is moderate. Pertinent negatives include no stiffness. He has tried nothing for the symptoms.    Past Medical History:  Diagnosis Date  . Anemia   . Arthritis   . BPH (benign prostatic hyperplasia)   . Chronic back pain   . Difficulty walking   . DVT (deep venous thrombosis) ([place])    Right popliteal DVT December 2017  . End-stage renal disease on hemodialysis ([place])    Dr. Lowanda Foster  . Erectile dysfunction   . Essential hypertension   . Gout   . Neuropathy, diabetic ([place])   . Type 2 diabetes mellitus ([place])   . Venous (peripheral) insufficiency     Patient Active Problem List   Diagnosis Date Noted  . Onychomycosis 06/17/2016  . Constipation 03/05/2016  . Anemia in chronic kidney disease (CKD) 03/05/2016  . Bleeding from wound 03/03/2016  . Chronic anticoagulation 03/03/2016  . Scrotal abscess 02/25/2016  . Type 2 diabetes mellitus ([place]) 02/25/2016  . Acute venous embolism and thrombosis of deep vessels of proximal lower extremity ([place]) [I82.4Y9] 02/11/2016  . Encounter for therapeutic drug monitoring 02/11/2016  . Nausea & vomiting 11/17/2014  . Thrombocytopenia ([place]) 02/21/2014  . Morbid obesity ([place]) 03/30/2013  . RBBB 03/29/2013  . Hyperkalemia 03/28/2013  . Leukocytosis 03/28/2013    . Nausea vomiting and diarrhea 03/28/2013  . BBB (bundle branch block) 03/28/2013  . Hypertension   . ESRD on dialysis ([place])   . Lack of coordination 04/15/2012  . Muscle weakness (generalized) 04/15/2012  . Arthritis, gouty 04/15/2012  . Difficulty in walking(719.7) 08/18/2011  . Weakness of both legs 08/18/2011    Past Surgical History:  Procedure Laterality Date  . AV FISTULA PLACEMENT Left 03/09/2014   Procedure: INSERTION OF ARTERIOVENOUS (AV) GORE-TEX GRAFT ARM;  Surgeon: Angelia Mould, MD;  Location: [facility] OR;  Service: Vascular;  Laterality: Left;  . AV FISTULA PLACEMENT Right 05/06/2016   Procedure: INSERTION OF ARTERIOVENOUS (AV) GORE-TEX GRAFT  Right ARM;  Surgeon: Elam Dutch, MD;  Location: [facility];  Service: Vascular;  Laterality: Right;  . CYST EXCISION Right    cyst removed on thumb 2001  . INSERTION OF DIALYSIS CATHETER    . INSERTION OF DIALYSIS CATHETER Left 03/09/2014   Procedure: INSERTION OF DIALYSIS CATHETER;  Surgeon: Angelia Mould, MD;  Location: [facility];  Service: Vascular;  Laterality: Left;  . IRRIGATION AND DEBRIDEMENT ABSCESS N/A 02/25/2016   Procedure: IRRIGATION AND DEBRIDEMENT SCROTAL ABSCESS;  Surgeon: Nickie Retort, MD;  Location: [facility] ORS;  Service: Urology;  Laterality: N/A;       Home Medications    Prior to Admission medications   Medication Sig Start Date End Date Taking?  Authorizing Provider  apixaban (ELIQUIS) 2.5 MG TABS tablet Take 1 tablet (2.5 mg total) by mouth 2 (two) times daily. 08/27/16   Milton Ferguson, MD  betamethasone dipropionate (DIPROLENE) 0.05 % cream Apply topically 2 (two) times daily. 06/17/16   Dorena Dew, FNP  cetirizine (ZYRTEC) 10 MG tablet Take 1 tablet (10 mg total) by mouth daily. Patient not taking: Reported on 06/18/2016 05/12/16   Dorena Dew, FNP  colchicine 0.6 MG tablet Take 0.5 tablets (0.3 mg total) by mouth daily as needed (for gout). 05/06/16   Alvia Grove, PA-C  diclofenac  sodium (VOLTAREN) 1 % GEL Apply 4 g topically 4 (four) times daily. 06/17/16   Dorena Dew, FNP  gabapentin (NEURONTIN) 100 MG capsule Take 100 mg by mouth daily.  07/11/14   [provider]  Multiple Vitamin (DAILY VITE PO) Take 1 tablet by mouth daily.    [provider]  Olopatadine HCl 0.2 % SOLN Apply 1 drop to eye daily. 05/12/16   Dorena Dew, FNP  oxyCODONE-acetaminophen (PERCOCET/ROXICET) 5-325 MG tablet Take 1 tablet by mouth every 6 (six) hours as needed. 05/06/16   Alvia Grove, PA-C  sevelamer carbonate (RENVELA) 800 MG tablet Take 1 tablet (800 mg total) by mouth 2 (two) times daily between meals as needed (With snacks). 02/29/16   Mikhail, Velta Addison, DO  sildenafil (REVATIO) 20 MG tablet Take 1 tablet (20 mg total) by mouth daily as needed. Dissolved under tongue (TROCHE) 60 minutes prior to sexual encounter 05/12/16   Dorena Dew, FNP  sulfamethoxazole-trimethoprim (BACTRIM DS,SEPTRA DS) 800-160 MG tablet Take 1 tablet by mouth 2 (two) times daily. 08/27/16 09/06/16  Milton Ferguson, MD  traMADol (ULTRAM) 50 MG tablet Take 1 tablet (50 mg total) by mouth every 6 (six) hours as needed. 08/27/16   Milton Ferguson, MD  triamcinolone cream (KENALOG) 0.1 % Apply 1 application topically 2 (two) times daily as needed.     [provider]  ULORIC 80 MG TABS Take 80 mg by mouth daily. 09/26/14   [provider]    Family History Family History  Problem Relation Age of Onset  . Diabetes Mother   . Hypertension Mother   . Heart failure Mother   . Hyperlipidemia Mother   . Heart attack Mother   . Cancer Father   . Diabetes Father   . Hypertension Father   . Hyperlipidemia Father     Social History Social History  Substance Use Topics  . Smoking status: Never Smoker  . Smokeless tobacco: Never Used  . Alcohol use No     Allergies   Doxycycline   Review of Systems Review of Systems  Constitutional: Negative for appetite change and  fatigue.  HENT: Negative for congestion, ear discharge and sinus pressure.   Eyes: Negative for discharge.  Respiratory: Negative for cough.   Cardiovascular: Negative for chest pain.  Gastrointestinal: Negative for abdominal pain and diarrhea.  Genitourinary: Negative for frequency and hematuria.  Musculoskeletal: Negative for back pain and stiffness.       Right lower leg pain  Skin: Negative for rash.       Skin lesions on buttocks and lower abdomen  Neurological: Negative for seizures and headaches.  Psychiatric/Behavioral: Negative for hallucinations.     Physical Exam Updated Vital Signs BP 124/90   Pulse (!) 30   Temp 97.7 F (36.5 C)   Resp 18   Ht 5\' 11"  (1.803 m)   Wt 118.8 kg (  262 lb)   SpO2 (!) 78%   BMI 36.54 kg/m   Physical Exam  Constitutional: He is oriented to person, place, and time. He appears well-developed.  HENT:  Head: Normocephalic.  Eyes: Conjunctivae and EOM are normal. No scleral icterus.  Neck: Neck supple. No thyromegaly present.  Cardiovascular: Normal rate and regular rhythm.  Exam reveals no gallop and no friction rub.   No murmur heard. Pulmonary/Chest: No stridor. He has no wheezes. He has no rales. He exhibits no tenderness.  Abdominal: He exhibits no distension. There is no tenderness. There is no rebound.  Musculoskeletal: Normal range of motion. He exhibits no edema.  Mild tenderness to popliteal area of right leg  Lymphadenopathy:    He has no cervical adenopathy.  Neurological: He is oriented to person, place, and time. He exhibits normal muscle tone. Coordination normal.  Skin: No rash noted. There is erythema.  Patient had skin lesions that look like superficial infections on his buttocks and lower abdomen.  Psychiatric: He has a normal mood and affect. His behavior is normal.     ED Treatments / Results  Labs (all labs ordered are listed, but only abnormal results are displayed) Labs Reviewed - No data to display  EKG   EKG Interpretation None       Radiology US Venous Img Lower Unilateral Right  Result Date: 08/27/2016 CLINICAL DATA:  Right leg pain, 3 days duration. EXAM: Right LOWER EXTREMITY VENOUS DOPPLER ULTRASOUND TECHNIQUE: Gray-scale sonography with graded compression, as well as color Doppler and duplex ultrasound were performed to evaluate the lower extremity deep venous systems from the level of the common femoral vein and including the common femoral, femoral, profunda femoral, popliteal and calf veins including the posterior tibial, peroneal and gastrocnemius veins when visible. The superficial great saphenous vein was also interrogated. Spectral Doppler was utilized to evaluate flow at rest and with distal augmentation maneuvers in the common femoral, femoral and popliteal veins. COMPARISON:  01/24/2016 FINDINGS: Contralateral Common Femoral Vein: Respiratory phasicity is normal and symmetric with the symptomatic side. No evidence of thrombus. Normal compressibility. Common Femoral Vein: No evidence of thrombus. Normal compressibility, respiratory phasicity and response to augmentation. Saphenofemoral Junction: No evidence of thrombus. Normal compressibility and flow on color Doppler imaging. Profunda Femoral Vein: No evidence of thrombus. Normal compressibility and flow on color Doppler imaging. Femoral Vein: No evidence of thrombus. Normal compressibility, respiratory phasicity and response to augmentation. Popliteal Vein: Deep venous thrombosis with nonocclusive thrombus. Whereas there has been previous thrombosis of this vessel and some of the findings may be chronic, there does appear to be some intraluminal thrombus that represents at least some acute component. Calf Veins: No evidence of thrombus. Normal compressibility and flow on color Doppler imaging. Superficial Great Saphenous Vein: No evidence of thrombus. Normal compressibility and flow on color Doppler imaging. Venous Reflux:  None. Other  Findings:  None. IMPRESSION: History of previous DVT of the popliteal vein. Today's study demonstrates what probably represents a combination of chronic changes as well as some acute nonocclusive thrombus in the popliteal vein. The rest of the exam is negative. Electronically Signed   By: Nelson Chimes M.D.   On: 08/27/2016 08:55    Procedures Procedures (including critical care time)  Medications Ordered in ED Medications  sulfamethoxazole-trimethoprim (BACTRIM DS,SEPTRA DS) 800-160 MG per tablet 1 tablet (not administered)  traMADol (ULTRAM) tablet 50 mg (not administered)  apixaban (ELIQUIS) tablet 2.5 mg (not administered)     Initial Impression /  Assessment and Plan / ED Course  I have reviewed the triage vital signs and the nursing notes.  Pertinent labs & imaging results that were available during my care of the patient were reviewed by me and considered in my medical decision making (see chart for details).     Patient has superficial skin lesions that he will be treated with Bactrim for. He also has a beginning of a DVT in his right leg. He will be placed on Eliquis 2.5 mg twice a day I have consult with nephrology and hematology. They both agree with the treatment and he will follow-up with hematology in 2 weeks. He will follow-up with his family doctor for skin infections in the next couple weeks also  F hematoinal Clinical Impressions(s) / ED Diagnoses   Final diagnoses:  DVT (deep vein thrombosis) in pregnancy [facility])    New Prescriptions New Prescriptions   APIXABAN (ELIQUIS) 2.5 MG TABS TABLET    Take 1 tablet (2.5 mg total) by mouth 2 (two) times daily.   SULFAMETHOXAZOLE-TRIMETHOPRIM (BACTRIM DS,SEPTRA DS) 800-160 MG TABLET    Take 1 tablet by mouth 2 (two) times daily.   TRAMADOL (ULTRAM) 50 MG TABLET    Take 1 tablet (50 mg total) by mouth every 6 (six) hours as needed.     Milton Ferguson, MD 08/27/16 1037

## 2016-08-28 ENCOUNTER — Ambulatory Visit (INDEPENDENT_AMBULATORY_CARE_PROVIDER_SITE_OTHER): Payer: Medicare Other | Admitting: Family Medicine

## 2016-08-28 ENCOUNTER — Encounter: Payer: Self-pay | Admitting: Family Medicine

## 2016-08-28 VITALS — BP 125/73 | HR 94 | Temp 98.2°F | Resp 16 | Ht 71.0 in | Wt 270.0 lb

## 2016-08-28 DIAGNOSIS — E118 Type 2 diabetes mellitus with unspecified complications: Secondary | ICD-10-CM

## 2016-08-28 DIAGNOSIS — Z7901 Long term (current) use of anticoagulants: Secondary | ICD-10-CM | POA: Diagnosis not present

## 2016-08-28 DIAGNOSIS — L304 Erythema intertrigo: Secondary | ICD-10-CM | POA: Diagnosis not present

## 2016-08-28 LAB — POCT GLYCOSYLATED HEMOGLOBIN (HGB A1C): HEMOGLOBIN A1C: 5

## 2016-08-28 MED ORDER — NYSTATIN 100000 UNIT/GM EX POWD: Freq: Two times a day (BID) | CUTANEOUS | 5 refills | Status: DC

## 2016-08-28 MED ORDER — APIXABAN 2.5 MG PO TABS: 2.5000 mg | ORAL_TABLET | Freq: Two times a day (BID) | ORAL | 5 refills | Status: DC

## 2016-08-28 NOTE — Patient Instructions (Signed)
Chronic anticoagulation therapy or DVT:  Continue Eliquis 2.5 mg twice daily.   Intertrigo or moisture to skin folds:  Apply Nystatin powder to skin folds twice daily  Bring printout from dialysis for review.    Follow up in office for prostate exam in October   Diabetes mellitus:  Recommend a lowfat, low carbohydrate diet divided over 5-6 small meals, increase water intake  Intertrigo Intertrigo is skin irritation (inflammation) that happens in warm, moist areas of the body. The irritation can cause a rash and make skin raw and itchy. The rash is usually pink or red. It happens mostly between folds of skin or where skin rubs together, such as:  Toes.  Armpits.  Groin.  Belly.  Breasts.  Buttocks.  This condition is not passed from person to person (is not contagious). Follow these instructions at home:  Keep the affected area clean and dry.  Do not scratch your skin.  Stay cool as much as possible. Use an air conditioner or fan, if you can.  Apply over-the-counter and prescription medicines only as told by your doctor.  If you were prescribed an antibiotic medicine, use it as told by your doctor. Do not stop using the antibiotic even if your condition starts to get better.  Keep all follow-up visits as told by your doctor. This is important. How is this prevented?  Stay at a healthy weight.  Keep your feet dry. This is very important if you have diabetes. Wear cotton or wool socks.  Take care of and protect the skin in your groin and butt area as told by your doctor.  Do not wear tight clothes. Wear clothes that: ? Are loose. ? Take away moisture from your body. ? Are made of cotton.  Wear a bra that gives good support, if needed.  Shower and dry yourself fully after being active.  Keep your blood sugar under control if you have diabetes. Contact a doctor if:  Your symptoms do not get better with treatment.  Your symptoms get worse or they  spread.  You notice more redness and warmth.  You have a fever. This information is not intended to replace advice given to you by your health care provider. Make sure you discuss any questions you have with your health care provider. Document Released: 02/22/2010 Document Revised: 06/28/2015 Document Reviewed: 07/24/2014 Elsevier Interactive Patient Education  [employer].

## 2016-08-28 NOTE — Progress Notes (Signed)
Subjective:     Jack Irwin is a 51 y.o. male with end stage renal disease presents for a follow up of chronic conditions.  K[facility] has dialysis scheduled on Monday, Wednesday, and Fridays. Diabetes has been controlled without medications. His most recent hemoglobin a1C was 5.4. Current symptoms include neuropathy to lower extremities. Patient denies hyperglycemia, hypoglycemia , increased appetite, polydipsia, polyuria, visual disturbances, vomiting and weight loss.    Patient was evaluated in the emergency department on 08/27/2016 due to pain in is right leg. He was found to have a DVT in the lower leg. He was started on Eliquis 2.5 mg twice daily. He is also scheduled to follow up with hematology. He has had a DVT in the past. He recently traveled to [place] by car.   He is also complaining of a rash to skin folds. He says that rash has been present over the past several weeks. He describes rash as occasional itching and moist. He has not attempted any OTC interventions to alleviate symptoms.   Past Medical History:  Diagnosis Date  . Anemia   . Arthritis   . BPH (benign prostatic hyperplasia)   . Chronic back pain   . Difficulty walking   . DVT (deep venous thrombosis) ([place])    Right popliteal DVT December 2017  . End-stage renal disease on hemodialysis ([place])    Dr. Lowanda Foster  . Erectile dysfunction   . Essential hypertension   . Gout   . Neuropathy, diabetic ([place])   . Type 2 diabetes mellitus ([place])   . Venous (peripheral) insufficiency    Social History   Social History  . Marital status: Married    Spouse name: N/A  . Number of children: N/A  . Years of education: N/A   Occupational History  . Not on file.   Social History Main Topics  . Smoking status: Never Smoker  . Smokeless tobacco: Never Used  . Alcohol use No  . Drug use: No  . Sexual activity: Not on file   Other Topics Concern  . Not on file   Social History Narrative  . No narrative on file    Review of Systems Review of Systems  Constitutional: Negative.  Negative for malaise/fatigue and weight loss.  HENT: Negative.   Eyes: Negative.   Respiratory: Negative.   Cardiovascular: Negative for chest pain.  Gastrointestinal: Negative.   Genitourinary: Negative.   Musculoskeletal: Positive for myalgias (right leg pain).  Skin: Positive for itching and rash.  Neurological: Negative.   Endo/Heme/Allergies: Negative.   Psychiatric/Behavioral: Negative.      Objective:  Physical Exam  Constitutional: He is well-developed, well-nourished, and in no distress.  HENT:  Head: Normocephalic.  Right Ear: External ear normal.  Eyes: Pupils are equal, round, and reactive to light.  Neck: Normal range of motion. Neck supple.  Cardiovascular: Normal rate and normal heart sounds.   Pulmonary/Chest: Effort normal and breath sounds normal.  Abdominal: Soft. Bowel sounds are normal.  Musculoskeletal: Normal range of motion.  Neurological: He is alert. Gait normal.  Skin:  Moisture and hyperpigmentation to abdominal folds   Psychiatric: Mood, memory, affect and judgment normal.   Assessment:    Diabetes mellitus Type II, under good control.    Plan:  1. Type 2 diabetes mellitus with complication, unspecified whether long term insulin use ([place]) Diabetes is controlled without medications. Recommend a lowfat, low carbohydrate diet divided over 5-6 small meals, fluid intake to be determined by dialysis, and remain active.  Patient recently referred to podiatry and is scheduled at Level 4 orthotic to be fitted for diabetic shoes.    - HgB A1c  2. Chronic anticoagulation Discussed potential risk of bleeding on anticoagulation therapy.  Indication: DVT - apixaban (ELIQUIS) 2.5 MG TABS tablet; Take 1 tablet (2.5 mg total) by mouth 2 (two) times daily.  Dispense: 60 tablet; Refill: 5  3. Intertrigo - nystatin (NYSTATIN) powder; Apply topically 2 (two) times daily.  Dispense: 15 g;  Refill: 5   RTC: 3 months for chronic conditions   [name]  MSN, FNP-C [place] [address] [place], [place] [address] [phone]

## 2016-08-29 DIAGNOSIS — N2581 Secondary hyperparathyroidism of renal origin: Secondary | ICD-10-CM | POA: Diagnosis not present

## 2016-08-29 DIAGNOSIS — D509 Iron deficiency anemia, unspecified: Secondary | ICD-10-CM | POA: Diagnosis not present

## 2016-08-29 DIAGNOSIS — Z992 Dependence on renal dialysis: Secondary | ICD-10-CM | POA: Diagnosis not present

## 2016-08-29 DIAGNOSIS — N186 End stage renal disease: Secondary | ICD-10-CM | POA: Diagnosis not present

## 2016-09-01 ENCOUNTER — Emergency Department (HOSPITAL_COMMUNITY)
Admission: EM | Admit: 2016-09-01 | Discharge: 2016-09-01 | Disposition: A | Discharge status: Home / Self Care | Payer: Medicare Other | Attending: Emergency Medicine | Admitting: Emergency Medicine

## 2016-09-01 ENCOUNTER — Emergency Department (HOSPITAL_COMMUNITY): Payer: Medicare Other

## 2016-09-01 ENCOUNTER — Encounter (HOSPITAL_COMMUNITY): Payer: Self-pay | Admitting: Emergency Medicine

## 2016-09-01 DIAGNOSIS — N509 Disorder of male genital organs, unspecified: Secondary | ICD-10-CM | POA: Insufficient documentation

## 2016-09-01 DIAGNOSIS — N492 Inflammatory disorders of scrotum: Secondary | ICD-10-CM | POA: Diagnosis not present

## 2016-09-01 DIAGNOSIS — I120 Hypertensive chronic kidney disease with stage 5 chronic kidney disease or end stage renal disease: Secondary | ICD-10-CM | POA: Insufficient documentation

## 2016-09-01 DIAGNOSIS — N5089 Other specified disorders of the male genital organs: Secondary | ICD-10-CM

## 2016-09-01 DIAGNOSIS — N186 End stage renal disease: Secondary | ICD-10-CM | POA: Insufficient documentation

## 2016-09-01 DIAGNOSIS — Z992 Dependence on renal dialysis: Secondary | ICD-10-CM | POA: Diagnosis not present

## 2016-09-01 DIAGNOSIS — E1140 Type 2 diabetes mellitus with diabetic neuropathy, unspecified: Secondary | ICD-10-CM | POA: Insufficient documentation

## 2016-09-01 DIAGNOSIS — E1122 Type 2 diabetes mellitus with diabetic chronic kidney disease: Secondary | ICD-10-CM | POA: Insufficient documentation

## 2016-09-01 DIAGNOSIS — D509 Iron deficiency anemia, unspecified: Secondary | ICD-10-CM | POA: Diagnosis not present

## 2016-09-01 DIAGNOSIS — N2581 Secondary hyperparathyroidism of renal origin: Secondary | ICD-10-CM | POA: Diagnosis not present

## 2016-09-01 DIAGNOSIS — Z79899 Other long term (current) drug therapy: Secondary | ICD-10-CM | POA: Diagnosis not present

## 2016-09-01 LAB — CBC WITH DIFFERENTIAL/PLATELET
Basophils Absolute: 0 10*3/uL (ref 0.0–0.1)
Basophils Relative: 0 %
Eosinophils Absolute: 0.1 10*3/uL (ref 0.0–0.7)
Eosinophils Relative: 2 %
HCT: 34.3 % — ABNORMAL LOW (ref 39.0–52.0)
HEMOGLOBIN: 8.8 g/dL — AB (ref 13.0–17.0)
LYMPHS ABS: 1.3 10*3/uL (ref 0.7–4.0)
Lymphocytes Relative: 19 %
MCH: 22.4 pg — AB (ref 26.0–34.0)
MCHC: 25.7 g/dL — AB (ref 30.0–36.0)
MCV: 87.3 fL (ref 78.0–100.0)
MONO ABS: 0.9 10*3/uL (ref 0.1–1.0)
MONOS PCT: 12 %
NEUTROS ABS: 4.8 10*3/uL (ref 1.7–7.7)
NEUTROS PCT: 68 %
Platelets: 55 10*3/uL — ABNORMAL LOW (ref 150–400)
RBC: 3.93 MIL/uL — ABNORMAL LOW (ref 4.22–5.81)
RDW: 18 % — AB (ref 11.5–15.5)
WBC: 7.1 10*3/uL (ref 4.0–10.5)

## 2016-09-01 LAB — BASIC METABOLIC PANEL
Anion gap: 14 (ref 5–15)
BUN: 36 mg/dL — AB (ref 6–20)
CHLORIDE: 100 mmol/L — AB (ref 101–111)
CO2: 23 mmol/L (ref 22–32)
CREATININE: 13.03 mg/dL — AB (ref 0.61–1.24)
Calcium: 9 mg/dL (ref 8.9–10.3)
GFR calc non Af Amer: 4 mL/min — ABNORMAL LOW (ref >60)
GFR, EST AFRICAN AMERICAN: 5 mL/min — AB (ref >60)
GLUCOSE: 87 mg/dL (ref 65–99)
Potassium: 5.7 mmol/L — ABNORMAL HIGH (ref 3.5–5.1)
Sodium: 137 mmol/L (ref 135–145)

## 2016-09-01 MED ORDER — LEVOFLOXACIN 500 MG PO TABS
500.0000 mg | ORAL_TABLET | Freq: Once | ORAL | Status: AC
  Administered 2016-09-01: 500 mg via ORAL
  Filled 2016-09-01: qty 1

## 2016-09-01 MED ORDER — LEVOFLOXACIN 500 MG PO TABS: 500.0000 mg | ORAL_TABLET | Freq: Every day | ORAL | 0 refills | Status: DC

## 2016-09-01 MED ORDER — SODIUM POLYSTYRENE SULFONATE 15 GM/60ML PO SUSP
30.0000 g | Freq: Once | ORAL | Status: DC
  Filled 2016-09-01: qty 120

## 2016-09-01 NOTE — ED Notes (Signed)
Patient transported to Ultrasound 

## 2016-09-01 NOTE — Discharge Instructions (Signed)
Make sure you go to dialysis on Wednesday. And follow-up tomorrow with the urologist the secretary will give you the appointment time

## 2016-09-01 NOTE — ED Provider Notes (Signed)
[facility] DEPT Provider Note   CSN: [id number] Arrival date & time: 09/01/16  1226     History   Chief Complaint Chief Complaint  Patient presents with  . Abscess    HPI Jack Irwin is a 51 y.o. male.  Patient complains of swelling to right scrotum.   The history is provided by the patient.  Testicle Pain  This is a new problem. The current episode started 1 to 2 hours ago. The problem occurs constantly. The problem has not changed since onset.Pertinent negatives include no chest pain, no abdominal pain and no headaches. Nothing aggravates the symptoms. Nothing relieves the symptoms.    Past Medical History:  Diagnosis Date  . Anemia   . Arthritis   . BPH (benign prostatic hyperplasia)   . Chronic back pain   . Difficulty walking   . DVT (deep venous thrombosis) ([place])    Right popliteal DVT December 2017  . End-stage renal disease on hemodialysis ([place])    Dr. Lowanda Foster  . Erectile dysfunction   . Essential hypertension   . Gout   . Neuropathy, diabetic ([place])   . Type 2 diabetes mellitus ([place])   . Venous (peripheral) insufficiency     Patient Active Problem List   Diagnosis Date Noted  . Onychomycosis 06/17/2016  . Constipation 03/05/2016  . Anemia in chronic kidney disease (CKD) 03/05/2016  . Bleeding from wound 03/03/2016  . Chronic anticoagulation 03/03/2016  . Scrotal abscess 02/25/2016  . Type 2 diabetes mellitus ([place]) 02/25/2016  . Acute venous embolism and thrombosis of deep vessels of proximal lower extremity ([place]) [I82.4Y9] 02/11/2016  . Encounter for therapeutic drug monitoring 02/11/2016  . Nausea & vomiting 11/17/2014  . Thrombocytopenia ([place]) 02/21/2014  . Morbid obesity ([place]) 03/30/2013  . RBBB 03/29/2013  . Hyperkalemia 03/28/2013  . Leukocytosis 03/28/2013  . Nausea vomiting and diarrhea 03/28/2013  . BBB (bundle branch block) 03/28/2013  . Hypertension   . ESRD on dialysis ([place])   . Lack of coordination 04/15/2012  . Muscle  weakness (generalized) 04/15/2012  . Arthritis, gouty 04/15/2012  . Difficulty in walking(719.7) 08/18/2011  . Weakness of both legs 08/18/2011    Past Surgical History:  Procedure Laterality Date  . AV FISTULA PLACEMENT Left 03/09/2014   Procedure: INSERTION OF ARTERIOVENOUS (AV) GORE-TEX GRAFT ARM;  Surgeon: Angelia Mould, MD;  Location[facility] OR;  Service: Vascular;  Laterality: Left;  . AV FISTULA PLACEMENT Right 05/06/2016   Procedure: INSERTION OF ARTERIOVENOUS (AV) GORE-TEX GRAFT  Right ARM;  Surgeon: Elam Dutch, MD;  Location: [facility];  Service: Vascular;  Laterality: Right;  . CYST EXCISION Right    cyst removed on thumb 2001  . INSERTION OF DIALYSIS CATHETER    . INSERTION OF DIALYSIS CATHETER Left 03/09/2014   Procedure: INSERTION OF DIALYSIS CATHETER;  Surgeon: Angelia Mould, MD;  Location: [facility];  Service: Vascular;  Laterality: Left;  . IRRIGATION AND DEBRIDEMENT ABSCESS N/A 02/25/2016   Procedure: IRRIGATION AND DEBRIDEMENT SCROTAL ABSCESS;  Surgeon: Nickie Retort, MD;  Location: [facility];  Service: Urology;  Laterality: N/A;       Home Medications    Prior to Admission medications   Medication Sig Start Date End Date Taking? Authorizing Provider  apixaban (ELIQUIS) 2.5 MG TABS tablet Take 1 tablet (2.5 mg total) by mouth 2 (two) times daily. 08/28/16  Yes Dorena Dew, FNP  betamethasone dipropionate (DIPROLENE) 0.05 % cream Apply topically 2 (two) times daily. 06/17/16  Yes  Dorena Dew, FNP  colchicine 0.6 MG tablet Take 0.5 tablets (0.3 mg total) by mouth daily as needed (for gout). 05/06/16  Yes Virgina Jock A, PA-C  diclofenac sodium (VOLTAREN) 1 % GEL Apply 4 g topically 4 (four) times daily. 06/17/16  Yes Dorena Dew, FNP  gabapentin (NEURONTIN) 100 MG capsule Take 100 mg by mouth daily.  07/11/14  Yes [provider]  sevelamer carbonate (RENVELA) 800 MG tablet Take 1 tablet (800 mg total) by mouth 2 (two) times daily between  meals as needed (With snacks). Patient taking differently: Take 800-3,200 mg by mouth 5 (five) times daily. Take 3200mg  three times a day with meals and take 800mg  twice a day with snacks 02/29/16  Yes Mikhail, [place], DO  sildenafil (REVATIO) 20 MG tablet Take 1 tablet (20 mg total) by mouth daily as needed. Dissolved under tongue (TROCHE) 60 minutes prior to sexual encounter 05/12/16  Yes Dorena Dew, FNP  sulfamethoxazole-trimethoprim (BACTRIM DS,SEPTRA DS) 800-160 MG tablet Take 1 tablet by mouth 2 (two) times daily. 08/27/16 09/06/16 Yes Milton Ferguson, MD  traMADol (ULTRAM) 50 MG tablet Take 1 tablet (50 mg total) by mouth every 6 (six) hours as needed. 08/27/16  Yes Milton Ferguson, MD  triamcinolone cream (KENALOG) 0.1 % Apply 1 application topically 2 (two) times daily as needed.    Yes [provider]  ULORIC 40 MG tablet Take 40 mg by mouth daily.  09/26/14  Yes [provider]  levofloxacin (LEVAQUIN) 500 MG tablet Take 1 tablet (500 mg total) by mouth daily. 09/01/16   Milton Ferguson, MD  Multiple Vitamin (DAILY VITE PO) Take 1 tablet by mouth daily.    [provider]  nystatin (NYSTATIN) powder Apply topically 2 (two) times daily. Patient not taking: Reported on 09/01/2016 08/28/16   Dorena Dew, FNP    Family History Family History  Problem Relation Age of Onset  . Diabetes Mother   . Hypertension Mother   . Heart failure Mother   . Hyperlipidemia Mother   . Heart attack Mother   . Cancer Father   . Diabetes Father   . Hypertension Father   . Hyperlipidemia Father     Social History Social History  Substance Use Topics  . Smoking status: Never Smoker  . Smokeless tobacco: Never Used  . Alcohol use No     Allergies   Doxycycline   Review of Systems Review of Systems  Constitutional: Negative for appetite change and fatigue.  HENT: Negative for congestion, ear discharge and sinus pressure.   Eyes: Negative for discharge.    Respiratory: Negative for cough.   Cardiovascular: Negative for chest pain.  Gastrointestinal: Negative for abdominal pain and diarrhea.  Genitourinary: Positive for scrotal swelling. Negative for frequency and hematuria.  Musculoskeletal: Negative for back pain.  Skin: Negative for rash.  Neurological: Negative for seizures and headaches.  Psychiatric/Behavioral: Negative for hallucinations.     Physical Exam Updated Vital Signs BP 103/87   Pulse 87   Temp (!) 97.5 F (36.4 C) (Oral)   Resp 16   Ht 5\' 11"  (1.803 m)   Wt 122.5 kg (270 lb)   SpO2 99%   BMI 37.66 kg/m   Physical Exam  Constitutional: He is oriented to person, place, and time. He appears well-developed.  HENT:  Head: Normocephalic.  Eyes: Conjunctivae and EOM are normal. No scleral icterus.  Neck: Neck supple. No thyromegaly present.  Cardiovascular: Normal rate and regular rhythm.  Exam reveals  no gallop and no friction rub.   No murmur heard. Pulmonary/Chest: No stridor. He has no wheezes. He has no rales. He exhibits no tenderness.  Abdominal: He exhibits no distension. There is no tenderness. There is no rebound.  Genitourinary:  Genitourinary Comments: Patient has scrotal swelling but no obvious tenderness  Musculoskeletal: Normal range of motion. He exhibits no edema.  Lymphadenopathy:    He has no cervical adenopathy.  Neurological: He is oriented to person, place, and time. He exhibits normal muscle tone. Coordination normal.  Skin: No rash noted. No erythema.  Psychiatric: He has a normal mood and affect. His behavior is normal.     ED Treatments / Results  Labs (all labs ordered are listed, but only abnormal results are displayed) Labs Reviewed  CBC WITH DIFFERENTIAL/PLATELET - Abnormal; Notable for the following:       Result Value   RBC 3.93 (*)    Hemoglobin 8.8 (*)    HCT 34.3 (*)    MCH 22.4 (*)    MCHC 25.7 (*)    RDW 18.0 (*)    Platelets 55 (*)    All other components within  normal limits  BASIC METABOLIC PANEL - Abnormal; Notable for the following:    Potassium 5.7 (*)    Chloride 100 (*)    BUN 36 (*)    Creatinine, Ser 13.03 (*)    GFR calc non Af Amer 4 (*)    GFR calc Af Amer 5 (*)    All other components within normal limits    EKG  EKG Interpretation None       Radiology US Scrotum  Result Date: 09/01/2016 CLINICAL DATA:  Right scrotal region soft tissue swelling EXAM: ULTRASOUND OF SCROTUM TECHNIQUE: Complete ultrasound examination of the testicles, epididymis, and other scrotal structures was performed. COMPARISON:  None. FINDINGS: Right testicle Measurements: 3.5 x 1.3 x 2.3 cm. No mass or microlithiasis visualized. Color flow is seen in the right testis. Left testicle Measurements: 3.5 x 1.6 x 3.0 cm. No mass or microlithiasis visualized. Color flow is seen in the left testis. Right epididymis: Right epididymis appears mildly edematous. No well-defined mass or hyperemia. Left epididymis:  Normal in size and appearance. Hydrocele: Small right hydrocele. There loculations within this hydrocele. Varicocele:  None visualized. Within the right scrotum, there is wall thickening. There is an apparent somewhat irregular area of decreased attenuation in the right scrotum laterally measuring 2.7 x 1.0 x 1.8 cm. This area appears to represent loculated fluid. There is mild thickening along the periphery of this area. No air is seen in this structure. IMPRESSION: 1. Apparent loculated fluid in the right scrotum with septation and peripheral thickening, likely thickening of fluid collection wall. Suspect residua of prior abscess or possibly liquified hematoma. Infected hematoma cannot be excluded. There is nonspecific soft tissue prominence in this area. There is no well-defined mass in this area. 2. Suspect mild epididymitis with mild edematous appearing epididymis on the right. No epididymal mass or hyperemia. 3. No intratesticular mass on either side. Color flow  seen in each testis. No demonstrable orchitis. 4.  Small mildly loculated right hydrocele. Electronically Signed   By: Lowella Grip III M.D.   On: 09/01/2016 16:23    Procedures Procedures (including critical care time)  Medications Ordered in ED Medications  sodium polystyrene (KAYEXALATE) 15 GM/60ML suspension 30 g (not administered)  levofloxacin (LEVAQUIN) tablet 500 mg (not administered)     Initial Impression / Assessment and Plan /  ED Course  I have reviewed the triage vital signs and the nursing notes.  Pertinent labs & imaging results that were available during my care of the patient were reviewed by me and considered in my medical decision making (see chart for details).   patient with fluid collection in scrotum. Possible infection he is already on Bactrim. This does not look like abscess that needs to be drained now. Patient is to see urology tomorrow    Final Clinical Impressions(s) / ED Diagnoses   Final diagnoses:  Scrotal mass    New Prescriptions New Prescriptions   LEVOFLOXACIN (LEVAQUIN) 500 MG TABLET    Take 1 tablet (500 mg total) by mouth daily.     Milton Ferguson, MD 09/01/16 [facility]

## 2016-09-01 NOTE — ED Triage Notes (Signed)
Patient states he had surgery for abscess on groin in January and states "I believe it's back." Complaining of swelling to groin area last night.

## 2016-09-02 ENCOUNTER — Other Ambulatory Visit (HOSPITAL_COMMUNITY): Payer: Self-pay | Admitting: Emergency Medicine

## 2016-09-02 DIAGNOSIS — N186 End stage renal disease: Secondary | ICD-10-CM | POA: Diagnosis not present

## 2016-09-02 DIAGNOSIS — Z992 Dependence on renal dialysis: Secondary | ICD-10-CM | POA: Diagnosis not present

## 2016-09-02 DIAGNOSIS — N5089 Other specified disorders of the male genital organs: Secondary | ICD-10-CM

## 2016-09-03 ENCOUNTER — Ambulatory Visit: Payer: Medicare Other | Admitting: Podiatry

## 2016-09-04 DIAGNOSIS — N499 Inflammatory disorder of unspecified male genital organ: Secondary | ICD-10-CM | POA: Diagnosis not present

## 2016-09-04 DIAGNOSIS — N5089 Other specified disorders of the male genital organs: Secondary | ICD-10-CM | POA: Diagnosis not present

## 2016-09-05 DIAGNOSIS — D509 Iron deficiency anemia, unspecified: Secondary | ICD-10-CM | POA: Diagnosis not present

## 2016-09-05 DIAGNOSIS — N186 End stage renal disease: Secondary | ICD-10-CM | POA: Diagnosis not present

## 2016-09-05 DIAGNOSIS — Z992 Dependence on renal dialysis: Secondary | ICD-10-CM | POA: Diagnosis not present

## 2016-09-05 DIAGNOSIS — N2581 Secondary hyperparathyroidism of renal origin: Secondary | ICD-10-CM | POA: Diagnosis not present

## 2016-09-05 DIAGNOSIS — R112 Nausea with vomiting, unspecified: Secondary | ICD-10-CM | POA: Diagnosis not present

## 2016-09-08 DIAGNOSIS — D631 Anemia in chronic kidney disease: Secondary | ICD-10-CM | POA: Diagnosis not present

## 2016-09-08 DIAGNOSIS — Z86718 Personal history of other venous thrombosis and embolism: Secondary | ICD-10-CM | POA: Diagnosis not present

## 2016-09-08 DIAGNOSIS — D509 Iron deficiency anemia, unspecified: Secondary | ICD-10-CM | POA: Diagnosis not present

## 2016-09-08 DIAGNOSIS — Z792 Long term (current) use of antibiotics: Secondary | ICD-10-CM | POA: Diagnosis not present

## 2016-09-08 DIAGNOSIS — N186 End stage renal disease: Secondary | ICD-10-CM | POA: Diagnosis not present

## 2016-09-08 DIAGNOSIS — G8929 Other chronic pain: Secondary | ICD-10-CM | POA: Diagnosis not present

## 2016-09-08 DIAGNOSIS — Z992 Dependence on renal dialysis: Secondary | ICD-10-CM | POA: Diagnosis not present

## 2016-09-08 DIAGNOSIS — E1151 Type 2 diabetes mellitus with diabetic peripheral angiopathy without gangrene: Secondary | ICD-10-CM | POA: Diagnosis not present

## 2016-09-08 DIAGNOSIS — M549 Dorsalgia, unspecified: Secondary | ICD-10-CM | POA: Diagnosis not present

## 2016-09-08 DIAGNOSIS — R112 Nausea with vomiting, unspecified: Secondary | ICD-10-CM | POA: Diagnosis not present

## 2016-09-08 DIAGNOSIS — E1142 Type 2 diabetes mellitus with diabetic polyneuropathy: Secondary | ICD-10-CM | POA: Diagnosis not present

## 2016-09-08 DIAGNOSIS — N492 Inflammatory disorders of scrotum: Secondary | ICD-10-CM | POA: Diagnosis not present

## 2016-09-08 DIAGNOSIS — Z7901 Long term (current) use of anticoagulants: Secondary | ICD-10-CM | POA: Diagnosis not present

## 2016-09-08 DIAGNOSIS — Z4800 Encounter for change or removal of nonsurgical wound dressing: Secondary | ICD-10-CM | POA: Diagnosis not present

## 2016-09-08 DIAGNOSIS — N2581 Secondary hyperparathyroidism of renal origin: Secondary | ICD-10-CM | POA: Diagnosis not present

## 2016-09-08 DIAGNOSIS — E1122 Type 2 diabetes mellitus with diabetic chronic kidney disease: Secondary | ICD-10-CM | POA: Diagnosis not present

## 2016-09-08 DIAGNOSIS — I120 Hypertensive chronic kidney disease with stage 5 chronic kidney disease or end stage renal disease: Secondary | ICD-10-CM | POA: Diagnosis not present

## 2016-09-10 DIAGNOSIS — Z4800 Encounter for change or removal of nonsurgical wound dressing: Secondary | ICD-10-CM | POA: Diagnosis not present

## 2016-09-10 DIAGNOSIS — E1142 Type 2 diabetes mellitus with diabetic polyneuropathy: Secondary | ICD-10-CM | POA: Diagnosis not present

## 2016-09-10 DIAGNOSIS — E1122 Type 2 diabetes mellitus with diabetic chronic kidney disease: Secondary | ICD-10-CM | POA: Diagnosis not present

## 2016-09-10 DIAGNOSIS — E1151 Type 2 diabetes mellitus with diabetic peripheral angiopathy without gangrene: Secondary | ICD-10-CM | POA: Diagnosis not present

## 2016-09-10 DIAGNOSIS — N492 Inflammatory disorders of scrotum: Secondary | ICD-10-CM | POA: Diagnosis not present

## 2016-09-10 DIAGNOSIS — I120 Hypertensive chronic kidney disease with stage 5 chronic kidney disease or end stage renal disease: Secondary | ICD-10-CM | POA: Diagnosis not present

## 2016-09-11 DIAGNOSIS — N499 Inflammatory disorder of unspecified male genital organ: Secondary | ICD-10-CM | POA: Diagnosis not present

## 2016-09-12 DIAGNOSIS — R112 Nausea with vomiting, unspecified: Secondary | ICD-10-CM | POA: Diagnosis not present

## 2016-09-12 DIAGNOSIS — N2581 Secondary hyperparathyroidism of renal origin: Secondary | ICD-10-CM | POA: Diagnosis not present

## 2016-09-12 DIAGNOSIS — Z992 Dependence on renal dialysis: Secondary | ICD-10-CM | POA: Diagnosis not present

## 2016-09-12 DIAGNOSIS — N186 End stage renal disease: Secondary | ICD-10-CM | POA: Diagnosis not present

## 2016-09-12 DIAGNOSIS — D509 Iron deficiency anemia, unspecified: Secondary | ICD-10-CM | POA: Diagnosis not present

## 2016-09-17 DIAGNOSIS — R112 Nausea with vomiting, unspecified: Secondary | ICD-10-CM | POA: Diagnosis not present

## 2016-09-17 DIAGNOSIS — N2581 Secondary hyperparathyroidism of renal origin: Secondary | ICD-10-CM | POA: Diagnosis not present

## 2016-09-17 DIAGNOSIS — N186 End stage renal disease: Secondary | ICD-10-CM | POA: Diagnosis not present

## 2016-09-17 DIAGNOSIS — Z992 Dependence on renal dialysis: Secondary | ICD-10-CM | POA: Diagnosis not present

## 2016-09-17 DIAGNOSIS — D509 Iron deficiency anemia, unspecified: Secondary | ICD-10-CM | POA: Diagnosis not present

## 2016-09-19 DIAGNOSIS — N2581 Secondary hyperparathyroidism of renal origin: Secondary | ICD-10-CM | POA: Diagnosis not present

## 2016-09-19 DIAGNOSIS — R112 Nausea with vomiting, unspecified: Secondary | ICD-10-CM | POA: Diagnosis not present

## 2016-09-19 DIAGNOSIS — D509 Iron deficiency anemia, unspecified: Secondary | ICD-10-CM | POA: Diagnosis not present

## 2016-09-19 DIAGNOSIS — N186 End stage renal disease: Secondary | ICD-10-CM | POA: Diagnosis not present

## 2016-09-19 DIAGNOSIS — Z992 Dependence on renal dialysis: Secondary | ICD-10-CM | POA: Diagnosis not present

## 2016-09-22 DIAGNOSIS — D509 Iron deficiency anemia, unspecified: Secondary | ICD-10-CM | POA: Diagnosis not present

## 2016-09-22 DIAGNOSIS — Z992 Dependence on renal dialysis: Secondary | ICD-10-CM | POA: Diagnosis not present

## 2016-09-22 DIAGNOSIS — R112 Nausea with vomiting, unspecified: Secondary | ICD-10-CM | POA: Diagnosis not present

## 2016-09-22 DIAGNOSIS — N2581 Secondary hyperparathyroidism of renal origin: Secondary | ICD-10-CM | POA: Diagnosis not present

## 2016-09-22 DIAGNOSIS — N186 End stage renal disease: Secondary | ICD-10-CM | POA: Diagnosis not present

## 2016-09-24 ENCOUNTER — Ambulatory Visit: Payer: Self-pay | Admitting: Urology

## 2016-09-26 ENCOUNTER — Telehealth: Payer: Self-pay

## 2016-09-26 ENCOUNTER — Other Ambulatory Visit: Payer: Self-pay

## 2016-09-26 DIAGNOSIS — D509 Iron deficiency anemia, unspecified: Secondary | ICD-10-CM | POA: Diagnosis not present

## 2016-09-26 DIAGNOSIS — Z7901 Long term (current) use of anticoagulants: Secondary | ICD-10-CM

## 2016-09-26 DIAGNOSIS — Z992 Dependence on renal dialysis: Secondary | ICD-10-CM | POA: Diagnosis not present

## 2016-09-26 DIAGNOSIS — N186 End stage renal disease: Secondary | ICD-10-CM | POA: Diagnosis not present

## 2016-09-26 DIAGNOSIS — R112 Nausea with vomiting, unspecified: Secondary | ICD-10-CM | POA: Diagnosis not present

## 2016-09-26 DIAGNOSIS — N2581 Secondary hyperparathyroidism of renal origin: Secondary | ICD-10-CM | POA: Diagnosis not present

## 2016-09-26 MED ORDER — APIXABAN 2.5 MG PO TABS: 2.5000 mg | ORAL_TABLET | Freq: Two times a day (BID) | ORAL | 5 refills | Status: DC

## 2016-09-26 NOTE — Telephone Encounter (Signed)
Refill for eliquis sent in. Called to clarify paper work request. Left message for patient to call back. Thanks!

## 2016-09-27 DIAGNOSIS — D509 Iron deficiency anemia, unspecified: Secondary | ICD-10-CM | POA: Diagnosis not present

## 2016-09-27 DIAGNOSIS — N2581 Secondary hyperparathyroidism of renal origin: Secondary | ICD-10-CM | POA: Diagnosis not present

## 2016-09-27 DIAGNOSIS — R112 Nausea with vomiting, unspecified: Secondary | ICD-10-CM | POA: Diagnosis not present

## 2016-09-27 DIAGNOSIS — N186 End stage renal disease: Secondary | ICD-10-CM | POA: Diagnosis not present

## 2016-09-27 DIAGNOSIS — Z992 Dependence on renal dialysis: Secondary | ICD-10-CM | POA: Diagnosis not present

## 2016-09-29 DIAGNOSIS — N2581 Secondary hyperparathyroidism of renal origin: Secondary | ICD-10-CM | POA: Diagnosis not present

## 2016-09-29 DIAGNOSIS — Z992 Dependence on renal dialysis: Secondary | ICD-10-CM | POA: Diagnosis not present

## 2016-09-29 DIAGNOSIS — D509 Iron deficiency anemia, unspecified: Secondary | ICD-10-CM | POA: Diagnosis not present

## 2016-09-29 DIAGNOSIS — R112 Nausea with vomiting, unspecified: Secondary | ICD-10-CM | POA: Diagnosis not present

## 2016-09-29 DIAGNOSIS — N186 End stage renal disease: Secondary | ICD-10-CM | POA: Diagnosis not present

## 2016-10-01 ENCOUNTER — Ambulatory Visit (INDEPENDENT_AMBULATORY_CARE_PROVIDER_SITE_OTHER): Payer: Medicare Other | Admitting: Urology

## 2016-10-01 DIAGNOSIS — N186 End stage renal disease: Secondary | ICD-10-CM | POA: Diagnosis not present

## 2016-10-01 DIAGNOSIS — N499 Inflammatory disorder of unspecified male genital organ: Secondary | ICD-10-CM

## 2016-10-01 DIAGNOSIS — R112 Nausea with vomiting, unspecified: Secondary | ICD-10-CM | POA: Diagnosis not present

## 2016-10-01 DIAGNOSIS — N2581 Secondary hyperparathyroidism of renal origin: Secondary | ICD-10-CM | POA: Diagnosis not present

## 2016-10-01 DIAGNOSIS — D509 Iron deficiency anemia, unspecified: Secondary | ICD-10-CM | POA: Diagnosis not present

## 2016-10-01 DIAGNOSIS — Z992 Dependence on renal dialysis: Secondary | ICD-10-CM | POA: Diagnosis not present

## 2016-10-03 ENCOUNTER — Other Ambulatory Visit: Payer: Self-pay

## 2016-10-03 DIAGNOSIS — R112 Nausea with vomiting, unspecified: Secondary | ICD-10-CM | POA: Diagnosis not present

## 2016-10-03 DIAGNOSIS — Z992 Dependence on renal dialysis: Secondary | ICD-10-CM | POA: Diagnosis not present

## 2016-10-03 DIAGNOSIS — N2581 Secondary hyperparathyroidism of renal origin: Secondary | ICD-10-CM | POA: Diagnosis not present

## 2016-10-03 DIAGNOSIS — D509 Iron deficiency anemia, unspecified: Secondary | ICD-10-CM | POA: Diagnosis not present

## 2016-10-03 DIAGNOSIS — N529 Male erectile dysfunction, unspecified: Secondary | ICD-10-CM

## 2016-10-03 DIAGNOSIS — N186 End stage renal disease: Secondary | ICD-10-CM | POA: Diagnosis not present

## 2016-10-03 MED ORDER — SILDENAFIL CITRATE 20 MG PO TABS: 20.0000 mg | ORAL_TABLET | Freq: Every day | ORAL | 5 refills | Status: DC | PRN

## 2016-10-03 NOTE — Telephone Encounter (Signed)
Refill for sildenafil sent into pharmacy.Thanks!

## 2016-10-08 DIAGNOSIS — N2581 Secondary hyperparathyroidism of renal origin: Secondary | ICD-10-CM | POA: Diagnosis not present

## 2016-10-08 DIAGNOSIS — N186 End stage renal disease: Secondary | ICD-10-CM | POA: Diagnosis not present

## 2016-10-08 DIAGNOSIS — D509 Iron deficiency anemia, unspecified: Secondary | ICD-10-CM | POA: Diagnosis not present

## 2016-10-08 DIAGNOSIS — Z992 Dependence on renal dialysis: Secondary | ICD-10-CM | POA: Diagnosis not present

## 2016-10-10 DIAGNOSIS — Z992 Dependence on renal dialysis: Secondary | ICD-10-CM | POA: Diagnosis not present

## 2016-10-10 DIAGNOSIS — D509 Iron deficiency anemia, unspecified: Secondary | ICD-10-CM | POA: Diagnosis not present

## 2016-10-10 DIAGNOSIS — N186 End stage renal disease: Secondary | ICD-10-CM | POA: Diagnosis not present

## 2016-10-10 DIAGNOSIS — N2581 Secondary hyperparathyroidism of renal origin: Secondary | ICD-10-CM | POA: Diagnosis not present

## 2016-10-13 ENCOUNTER — Telehealth: Payer: Self-pay

## 2016-10-13 DIAGNOSIS — Z992 Dependence on renal dialysis: Secondary | ICD-10-CM | POA: Diagnosis not present

## 2016-10-13 DIAGNOSIS — N186 End stage renal disease: Secondary | ICD-10-CM | POA: Diagnosis not present

## 2016-10-13 DIAGNOSIS — N2581 Secondary hyperparathyroidism of renal origin: Secondary | ICD-10-CM | POA: Diagnosis not present

## 2016-10-13 DIAGNOSIS — D509 Iron deficiency anemia, unspecified: Secondary | ICD-10-CM | POA: Diagnosis not present

## 2016-10-13 MED ORDER — GABAPENTIN 100 MG PO CAPS: 100.0000 mg | ORAL_CAPSULE | Freq: Every day | ORAL | 3 refills | Status: DC

## 2016-10-13 NOTE — Telephone Encounter (Signed)
Refill for gabapentin sent into pharmacy. Thanks!  

## 2016-10-15 DIAGNOSIS — Z992 Dependence on renal dialysis: Secondary | ICD-10-CM | POA: Diagnosis not present

## 2016-10-15 DIAGNOSIS — N2581 Secondary hyperparathyroidism of renal origin: Secondary | ICD-10-CM | POA: Diagnosis not present

## 2016-10-15 DIAGNOSIS — N186 End stage renal disease: Secondary | ICD-10-CM | POA: Diagnosis not present

## 2016-10-15 DIAGNOSIS — D509 Iron deficiency anemia, unspecified: Secondary | ICD-10-CM | POA: Diagnosis not present

## 2016-10-17 DIAGNOSIS — N2581 Secondary hyperparathyroidism of renal origin: Secondary | ICD-10-CM | POA: Diagnosis not present

## 2016-10-17 DIAGNOSIS — Z992 Dependence on renal dialysis: Secondary | ICD-10-CM | POA: Diagnosis not present

## 2016-10-17 DIAGNOSIS — D509 Iron deficiency anemia, unspecified: Secondary | ICD-10-CM | POA: Diagnosis not present

## 2016-10-17 DIAGNOSIS — N186 End stage renal disease: Secondary | ICD-10-CM | POA: Diagnosis not present

## 2016-10-20 DIAGNOSIS — Z992 Dependence on renal dialysis: Secondary | ICD-10-CM | POA: Diagnosis not present

## 2016-10-20 DIAGNOSIS — N2581 Secondary hyperparathyroidism of renal origin: Secondary | ICD-10-CM | POA: Diagnosis not present

## 2016-10-20 DIAGNOSIS — D509 Iron deficiency anemia, unspecified: Secondary | ICD-10-CM | POA: Diagnosis not present

## 2016-10-20 DIAGNOSIS — N186 End stage renal disease: Secondary | ICD-10-CM | POA: Diagnosis not present

## 2016-10-22 DIAGNOSIS — N2581 Secondary hyperparathyroidism of renal origin: Secondary | ICD-10-CM | POA: Diagnosis not present

## 2016-10-22 DIAGNOSIS — N186 End stage renal disease: Secondary | ICD-10-CM | POA: Diagnosis not present

## 2016-10-22 DIAGNOSIS — D509 Iron deficiency anemia, unspecified: Secondary | ICD-10-CM | POA: Diagnosis not present

## 2016-10-22 DIAGNOSIS — Z992 Dependence on renal dialysis: Secondary | ICD-10-CM | POA: Diagnosis not present

## 2016-10-24 DIAGNOSIS — N186 End stage renal disease: Secondary | ICD-10-CM | POA: Diagnosis not present

## 2016-10-24 DIAGNOSIS — Z992 Dependence on renal dialysis: Secondary | ICD-10-CM | POA: Diagnosis not present

## 2016-10-24 DIAGNOSIS — N2581 Secondary hyperparathyroidism of renal origin: Secondary | ICD-10-CM | POA: Diagnosis not present

## 2016-10-24 DIAGNOSIS — D509 Iron deficiency anemia, unspecified: Secondary | ICD-10-CM | POA: Diagnosis not present

## 2016-10-27 DIAGNOSIS — Z992 Dependence on renal dialysis: Secondary | ICD-10-CM | POA: Diagnosis not present

## 2016-10-27 DIAGNOSIS — N2581 Secondary hyperparathyroidism of renal origin: Secondary | ICD-10-CM | POA: Diagnosis not present

## 2016-10-27 DIAGNOSIS — D509 Iron deficiency anemia, unspecified: Secondary | ICD-10-CM | POA: Diagnosis not present

## 2016-10-27 DIAGNOSIS — N186 End stage renal disease: Secondary | ICD-10-CM | POA: Diagnosis not present

## 2016-10-31 ENCOUNTER — Other Ambulatory Visit (HOSPITAL_COMMUNITY): Payer: Self-pay | Admitting: Medical

## 2016-10-31 ENCOUNTER — Other Ambulatory Visit: Payer: Self-pay | Admitting: Student

## 2016-10-31 DIAGNOSIS — N186 End stage renal disease: Secondary | ICD-10-CM

## 2016-10-31 DIAGNOSIS — Z992 Dependence on renal dialysis: Principal | ICD-10-CM

## 2016-11-01 ENCOUNTER — Other Ambulatory Visit (HOSPITAL_COMMUNITY): Payer: Self-pay | Admitting: Medical

## 2016-11-01 ENCOUNTER — Ambulatory Visit (HOSPITAL_COMMUNITY)
Admission: RE | Admit: 2016-11-01 | Discharge: 2016-11-01 | Disposition: A | Discharge status: Home / Self Care | Payer: Medicare Other | Source: Ambulatory Visit | Attending: Medical | Admitting: Medical

## 2016-11-01 ENCOUNTER — Encounter (HOSPITAL_COMMUNITY): Payer: Self-pay | Admitting: Interventional Radiology

## 2016-11-01 DIAGNOSIS — Z86718 Personal history of other venous thrombosis and embolism: Secondary | ICD-10-CM | POA: Diagnosis not present

## 2016-11-01 DIAGNOSIS — Y832 Surgical operation with anastomosis, bypass or graft as the cause of abnormal reaction of the patient, or of later complication, without mention of misadventure at the time of the procedure: Secondary | ICD-10-CM | POA: Diagnosis not present

## 2016-11-01 DIAGNOSIS — I120 Hypertensive chronic kidney disease with stage 5 chronic kidney disease or end stage renal disease: Secondary | ICD-10-CM | POA: Insufficient documentation

## 2016-11-01 DIAGNOSIS — Z992 Dependence on renal dialysis: Secondary | ICD-10-CM | POA: Diagnosis not present

## 2016-11-01 DIAGNOSIS — T82868A Thrombosis of vascular prosthetic devices, implants and grafts, initial encounter: Secondary | ICD-10-CM | POA: Diagnosis not present

## 2016-11-01 DIAGNOSIS — Z7901 Long term (current) use of anticoagulants: Secondary | ICD-10-CM | POA: Diagnosis not present

## 2016-11-01 DIAGNOSIS — E1140 Type 2 diabetes mellitus with diabetic neuropathy, unspecified: Secondary | ICD-10-CM | POA: Diagnosis not present

## 2016-11-01 DIAGNOSIS — M1990 Unspecified osteoarthritis, unspecified site: Secondary | ICD-10-CM | POA: Insufficient documentation

## 2016-11-01 DIAGNOSIS — M549 Dorsalgia, unspecified: Secondary | ICD-10-CM | POA: Diagnosis not present

## 2016-11-01 DIAGNOSIS — E1122 Type 2 diabetes mellitus with diabetic chronic kidney disease: Secondary | ICD-10-CM | POA: Insufficient documentation

## 2016-11-01 DIAGNOSIS — M109 Gout, unspecified: Secondary | ICD-10-CM | POA: Diagnosis not present

## 2016-11-01 DIAGNOSIS — N186 End stage renal disease: Secondary | ICD-10-CM

## 2016-11-01 DIAGNOSIS — T82858A Stenosis of vascular prosthetic devices, implants and grafts, initial encounter: Secondary | ICD-10-CM | POA: Diagnosis not present

## 2016-11-01 DIAGNOSIS — I872 Venous insufficiency (chronic) (peripheral): Secondary | ICD-10-CM | POA: Diagnosis not present

## 2016-11-01 DIAGNOSIS — N400 Enlarged prostate without lower urinary tract symptoms: Secondary | ICD-10-CM | POA: Insufficient documentation

## 2016-11-01 DIAGNOSIS — G8929 Other chronic pain: Secondary | ICD-10-CM | POA: Diagnosis not present

## 2016-11-01 DIAGNOSIS — N2581 Secondary hyperparathyroidism of renal origin: Secondary | ICD-10-CM | POA: Diagnosis not present

## 2016-11-01 DIAGNOSIS — D509 Iron deficiency anemia, unspecified: Secondary | ICD-10-CM | POA: Diagnosis not present

## 2016-11-01 HISTORY — PX: IR THROMBECTOMY AV FISTULA W/THROMBOLYSIS/PTA INC/SHUNT/IMG RIGHT: IMG6119

## 2016-11-01 HISTORY — PX: IR US GUIDE VASC ACCESS RIGHT: IMG2390

## 2016-11-01 MED ORDER — MIDAZOLAM HCL 2 MG/2ML IJ SOLN
INTRAMUSCULAR | Status: AC | PRN
  Administered 2016-11-01: 1 mg via INTRAVENOUS

## 2016-11-01 MED ORDER — HEPARIN SODIUM (PORCINE) 1000 UNIT/ML IJ SOLN
INTRAMUSCULAR | Status: AC
  Filled 2016-11-01: qty 1

## 2016-11-01 MED ORDER — FENTANYL CITRATE (PF) 100 MCG/2ML IJ SOLN
INTRAMUSCULAR | Status: AC | PRN
  Administered 2016-11-01 (×2): 50 ug via INTRAVENOUS

## 2016-11-01 MED ORDER — ALTEPLASE 2 MG IJ SOLR
INTRAMUSCULAR | Status: AC | PRN
  Administered 2016-11-01: 4 mg

## 2016-11-01 MED ORDER — FENTANYL CITRATE (PF) 100 MCG/2ML IJ SOLN
INTRAMUSCULAR | Status: AC
  Filled 2016-11-01: qty 4

## 2016-11-01 MED ORDER — MIDAZOLAM HCL 2 MG/2ML IJ SOLN
INTRAMUSCULAR | Status: AC
  Filled 2016-11-01: qty 4

## 2016-11-01 MED ORDER — HEPARIN BOLUS VIA INFUSION
INTRAVENOUS | Status: AC | PRN
  Administered 2016-11-01: 4000 [IU] via INTRAVENOUS

## 2016-11-01 MED ORDER — LIDOCAINE HCL (PF) 1 % IJ SOLN
INTRAMUSCULAR | Status: DC | PRN
  Administered 2016-11-01: 10 mL

## 2016-11-01 MED ORDER — LIDOCAINE HCL 1 % IJ SOLN
INTRAMUSCULAR | Status: AC
  Filled 2016-11-01: qty 20

## 2016-11-01 MED ORDER — CEFAZOLIN SODIUM-DEXTROSE 2-4 GM/100ML-% IV SOLN
INTRAVENOUS | Status: AC
  Administered 2016-11-01: 2 g via INTRAVENOUS
  Filled 2016-11-01: qty 100

## 2016-11-01 MED ORDER — CEFAZOLIN SODIUM-DEXTROSE 2-4 GM/100ML-% IV SOLN
2.0000 g | Freq: Once | INTRAVENOUS | Status: AC
  Administered 2016-11-01: 2 g via INTRAVENOUS

## 2016-11-01 MED ORDER — ALTEPLASE 2 MG IJ SOLR
INTRAMUSCULAR | Status: AC
  Filled 2016-11-01: qty 4

## 2016-11-01 MED ORDER — IOPAMIDOL (ISOVUE-300) INJECTION 61%
INTRAVENOUS | Status: AC
  Administered 2016-11-01: 50 mL
  Filled 2016-11-01: qty 100

## 2016-11-01 NOTE — Procedures (Signed)
Pre procedural Dx: ESRD Post procedural Dx: Same  Technically successful declot of right upper arm dialysis graft.    EBL: Trace  No immediate post procedural complications. Access is ready for immediate use.  Ronny Bacon, MD Pager #: [phone]

## 2016-11-01 NOTE — Consult Note (Signed)
Chief Complaint: Clotted dialysis graft  Referring Physician(s): Lyles,Charles  Patient Status: [facility] - Out-pt  History of Present Illness: Jack Irwin is a 51 y.o. male with past medical history significant for type 2 diabetes, DVT, hypertension, gout, neuropathy, anemia and end-stage renal disease, currently receiving hemodialysis via a right upper extremity dialysis graft.  The graft was created in April of this year and has been utilized since May of this year.   The patient states that he has been receiving dialysis via this graft without incident. He has not received any interventions since the time of initial usage of the dialysis graft.  Patient received dialysis this past Monday though missed his Wednesday's session secondary to an appointment. When he presented to dialysis on Friday, his graft was clotted.  As such, patient presents today for attempted fluoroscopic guided dialysis graft declot procedure with potential stent and/or hemodialysis catheter placement.  The patient is currently on Eliquis secondary to history of DVT.  Patient is otherwise without complaint. No fever or chills. No chest pain or shortness of breath. Patient is accompanied by his wife though serves as his primary historian.  Past Medical History:  Diagnosis Date  . Anemia   . Arthritis   . BPH (benign prostatic hyperplasia)   . Chronic back pain   . Difficulty walking   . DVT (deep venous thrombosis) ([place])    Right popliteal DVT December 2017  . End-stage renal disease on hemodialysis ([place])    Dr. Lowanda Foster  . Erectile dysfunction   . Essential hypertension   . Gout   . Neuropathy, diabetic ([place])   . Type 2 diabetes mellitus ([place])   . Venous (peripheral) insufficiency     Past Surgical History:  Procedure Laterality Date  . AV FISTULA PLACEMENT Left 03/09/2014   Procedure: INSERTION OF ARTERIOVENOUS (AV) GORE-TEX GRAFT ARM;  Surgeon: Angelia Mould, MD;  Location: [facility] OR;   Service: Vascular;  Laterality: Left;  . AV FISTULA PLACEMENT Right 05/06/2016   Procedure: INSERTION OF ARTERIOVENOUS (AV) GORE-TEX GRAFT  Right ARM;  Surgeon: Elam Dutch, MD;  Location: [facility];  Service: Vascular;  Laterality: Right;  . CYST EXCISION Right    cyst removed on thumb 2001  . INSERTION OF DIALYSIS CATHETER    . INSERTION OF DIALYSIS CATHETER Left 03/09/2014   Procedure: INSERTION OF DIALYSIS CATHETER;  Surgeon: Angelia Mould, MD;  Location: [facility];  Service: Vascular;  Laterality: Left;  . IRRIGATION AND DEBRIDEMENT ABSCESS N/A 02/25/2016   Procedure: IRRIGATION AND DEBRIDEMENT SCROTAL ABSCESS;  Surgeon: Nickie Retort, MD;  Location: [facility];  Service: Urology;  Laterality: N/A;    Allergies: Doxycycline  Medications: Prior to Admission medications   Medication Sig Start Date End Date Taking? Authorizing Provider  apixaban (ELIQUIS) 2.5 MG TABS tablet Take 1 tablet (2.5 mg total) by mouth 2 (two) times daily. 09/26/16   Dorena Dew, FNP  betamethasone dipropionate (DIPROLENE) 0.05 % cream Apply topically 2 (two) times daily. 06/17/16   Dorena Dew, FNP  colchicine 0.6 MG tablet Take 0.5 tablets (0.3 mg total) by mouth daily as needed (for gout). 05/06/16   Alvia Grove, PA-C  diclofenac sodium (VOLTAREN) 1 % GEL Apply 4 g topically 4 (four) times daily. 06/17/16   Dorena Dew, FNP  gabapentin (NEURONTIN) 100 MG capsule Take 1 capsule (100 mg total) by mouth daily. 10/13/16   Dorena Dew, FNP  levofloxacin (LEVAQUIN) 500 MG tablet Take 1  tablet (500 mg total) by mouth daily. 09/01/16   Milton Ferguson, MD  Multiple Vitamin (DAILY VITE PO) Take 1 tablet by mouth daily.    [provider]  nystatin (NYSTATIN) powder Apply topically 2 (two) times daily. Patient not taking: Reported on 09/01/2016 08/28/16   Dorena Dew, FNP  sevelamer carbonate (RENVELA) 800 MG tablet Take 1 tablet (800 mg total) by mouth 2 (two) times daily between  meals as needed (With snacks). Patient taking differently: Take 800-3,200 mg by mouth 5 (five) times daily. Take 3200mg  three times a day with meals and take 800mg  twice a day with snacks 02/29/16   Cristal Ford, DO  sildenafil (REVATIO) 20 MG tablet Take 1 tablet (20 mg total) by mouth daily as needed. Dissolved under tongue (TROCHE) 60 minutes prior to sexual encounter 10/03/16   Dorena Dew, FNP  traMADol (ULTRAM) 50 MG tablet Take 1 tablet (50 mg total) by mouth every 6 (six) hours as needed. 08/27/16   Milton Ferguson, MD  triamcinolone cream (KENALOG) 0.1 % Apply 1 application topically 2 (two) times daily as needed.     [provider]  ULORIC 40 MG tablet Take 40 mg by mouth daily.  09/26/14   [provider]     Family History  Problem Relation Age of Onset  . Diabetes Mother   . Hypertension Mother   . Heart failure Mother   . Hyperlipidemia Mother   . Heart attack Mother   . Cancer Father   . Diabetes Father   . Hypertension Father   . Hyperlipidemia Father     Social History   Social History  . Marital status: Married    Spouse name: N/A  . Number of children: N/A  . Years of education: N/A   Social History Main Topics  . Smoking status: Never Smoker  . Smokeless tobacco: Never Used  . Alcohol use No  . Drug use: No  . Sexual activity: Not on file   Other Topics Concern  . Not on file   Social History Narrative  . No narrative on file    ECOG Status: 1 - Symptomatic but completely ambulatory  Review of Systems: A 12 point ROS discussed and pertinent positives are indicated in the HPI above.  All other systems are negative.  Review of Systems  Constitutional: Negative for activity change, appetite change, fatigue and fever.  Respiratory: Negative.   Cardiovascular: Negative.   Gastrointestinal: Negative.     Vital Signs: There were no vitals taken for this visit.  Physical Exam  Constitutional: He appears well-developed and  well-nourished.  Cardiovascular: Normal rate and regular rhythm.   Pulmonary/Chest: Effort normal and breath sounds normal.  Nursing note and vitals reviewed.   Imaging: No results found.  Labs:  CBC:  Recent Labs  03/11/16 0610 03/12/16 0531 03/13/16 0604 05/06/16 0636 09/01/16 1628  WBC 16.6* 13.4* 12.0*  --  7.1  HGB 7.4* 7.9* 8.3* 11.2* 8.8*  HCT 22.6* 24.3* 26.1* 33.0* 34.3*  PLT 308 309 280  --  55*    COAGS:  Recent Labs  03/12/16 0531 03/13/16 0604 03/17/16 1552 04/23/16 1450  INR 1.35 1.17 1.3 1.1    BMP:  Recent Labs  03/10/16 0616 03/12/16 0534 03/13/16 0604 05/06/16 0636 09/01/16 1628  NA 134* 133* 134* 139 137  K 4.5 3.9 4.3 4.2 5.7*  CL 94* 93* 94*  --  100*  CO2 24 25 29   --  23  GLUCOSE  98 104* 79 92 87  BUN 83* 56* 39*  --  36*  CALCIUM 8.7* 8.9 9.4  --  9.0  CREATININE 15.70* 12.52* 10.21*  --  13.03*  GFRNONAA 3* 4* 5*  --  4*  GFRAA 4* 5* 6*  --  5*    LIVER FUNCTION TESTS:  Recent Labs  02/26/16 0449  03/07/16 0544 03/08/16 0632 03/10/16 0616 03/12/16 0534  BILITOT 0.8  --   --   --   --   --   AST 22  --   --   --   --   --   ALT 19  --   --   --   --   --   ALKPHOS 62  --   --   --   --   --   PROT 7.4  --   --   --   --   --   ALBUMIN 3.0*  < > 3.0* 2.7* 2.3* 2.4*  < > = values in this interval not displayed.  TUMOR MARKERS: No results for input(s): AFPTM, CEA, CA199, CHROMGRNA in the last 8760 hours.  Assessment and Plan:  Jack Irwin is a 51 y.o. male with past medical history significant for type 2 diabetes, DVT, hypertension, gout, neuropathy, anemia and end-stage renal disease, currently receiving hemodialysis via a right upper extremity dialysis graft.    Patient received dialysis this past Monday though missed his Wednesday's session secondary to an appointment. When he presented to dialysis on Friday, his graft was clotted.   Risks and benefits of attempted fluoro guided declot were discussed  with the patient including, but not limited to bleeding, infection, vascular injury, pulmonary embolism, need for tunneled HD catheter placement or even death.  All of the patient's questions were answered, patient is agreeable to proceed.  Consent signed and in chart.  Thank you for this interesting consult.  I greatly enjoyed meeting Jack Irwin and look forward to participating in their care.  A copy of this report was sent to the requesting provider on this date.  Electronically Signed: Sandi Mariscal, MD 11/01/2016, 8:51 AM   I spent a total of 30 Minutes in face to face in clinical consultation, greater than 50% of which was counseling/coordinating care for clotted dialysis graft.

## 2016-11-01 NOTE — Sedation Documentation (Signed)
L and R radial pulses 2+. Palpable graft site thrill.

## 2016-11-02 DIAGNOSIS — N186 End stage renal disease: Secondary | ICD-10-CM | POA: Diagnosis not present

## 2016-11-02 DIAGNOSIS — Z992 Dependence on renal dialysis: Secondary | ICD-10-CM | POA: Diagnosis not present

## 2016-11-03 ENCOUNTER — Encounter (HOSPITAL_COMMUNITY): Payer: Self-pay | Admitting: Interventional Radiology

## 2016-11-03 ENCOUNTER — Other Ambulatory Visit: Payer: Self-pay

## 2016-11-03 ENCOUNTER — Other Ambulatory Visit (HOSPITAL_COMMUNITY): Payer: Self-pay | Admitting: Medical

## 2016-11-03 DIAGNOSIS — Z992 Dependence on renal dialysis: Principal | ICD-10-CM

## 2016-11-03 DIAGNOSIS — Z7901 Long term (current) use of anticoagulants: Secondary | ICD-10-CM

## 2016-11-03 DIAGNOSIS — N2581 Secondary hyperparathyroidism of renal origin: Secondary | ICD-10-CM | POA: Diagnosis not present

## 2016-11-03 DIAGNOSIS — N186 End stage renal disease: Secondary | ICD-10-CM

## 2016-11-03 DIAGNOSIS — D509 Iron deficiency anemia, unspecified: Secondary | ICD-10-CM | POA: Diagnosis not present

## 2016-11-03 MED ORDER — APIXABAN 2.5 MG PO TABS: 2.5000 mg | ORAL_TABLET | Freq: Two times a day (BID) | ORAL | 5 refills | Status: DC

## 2016-11-03 NOTE — Telephone Encounter (Signed)
Patient called and said he had lost his bottle of eliquis, I have refilled this and sent into pharmacy. Thanks!

## 2016-11-05 DIAGNOSIS — N186 End stage renal disease: Secondary | ICD-10-CM | POA: Diagnosis not present

## 2016-11-05 DIAGNOSIS — D509 Iron deficiency anemia, unspecified: Secondary | ICD-10-CM | POA: Diagnosis not present

## 2016-11-05 DIAGNOSIS — Z992 Dependence on renal dialysis: Secondary | ICD-10-CM | POA: Diagnosis not present

## 2016-11-05 DIAGNOSIS — N2581 Secondary hyperparathyroidism of renal origin: Secondary | ICD-10-CM | POA: Diagnosis not present

## 2016-11-06 ENCOUNTER — Ambulatory Visit: Payer: Self-pay | Admitting: Family Medicine

## 2016-11-06 ENCOUNTER — Encounter: Payer: Self-pay | Admitting: Family Medicine

## 2016-11-06 ENCOUNTER — Ambulatory Visit (INDEPENDENT_AMBULATORY_CARE_PROVIDER_SITE_OTHER): Payer: Medicare Other | Admitting: Family Medicine

## 2016-11-06 VITALS — BP 106/78 | HR 116 | Temp 97.9°F | Resp 18 | Ht 71.0 in | Wt 260.0 lb

## 2016-11-06 DIAGNOSIS — M109 Gout, unspecified: Secondary | ICD-10-CM | POA: Diagnosis not present

## 2016-11-06 DIAGNOSIS — N186 End stage renal disease: Secondary | ICD-10-CM | POA: Diagnosis not present

## 2016-11-06 DIAGNOSIS — E1122 Type 2 diabetes mellitus with diabetic chronic kidney disease: Secondary | ICD-10-CM

## 2016-11-06 DIAGNOSIS — N400 Enlarged prostate without lower urinary tract symptoms: Secondary | ICD-10-CM

## 2016-11-06 DIAGNOSIS — Z992 Dependence on renal dialysis: Secondary | ICD-10-CM

## 2016-11-06 DIAGNOSIS — N529 Male erectile dysfunction, unspecified: Secondary | ICD-10-CM

## 2016-11-06 DIAGNOSIS — Z125 Encounter for screening for malignant neoplasm of prostate: Secondary | ICD-10-CM | POA: Diagnosis not present

## 2016-11-06 MED ORDER — SILDENAFIL CITRATE 20 MG PO TABS: 20.0000 mg | ORAL_TABLET | Freq: Every day | ORAL | 5 refills | Status: DC | PRN

## 2016-11-06 MED ORDER — ULORIC 40 MG PO TABS: 40.0000 mg | ORAL_TABLET | Freq: Every day | ORAL | 5 refills | Status: DC

## 2016-11-06 NOTE — Patient Instructions (Addendum)
Your prostate is enlarged.  Will follow up by phone with any abnormal results Bring laboratory results from dialysis to your next appointment   Skin is dry and flaking. Recommend oil based lotion for your skin   The patient desires Viagra to treat his erectile dysfunction. History and physical exam has not disclosed any obvious treatable cause of this complaint. He is informed that Viagra is sometimes not covered by insurance. It is available on a fee-for-service cost basis, and is relatively expensive. He can start with 20 mg. The method of use 1 hour prior to anticipated intercourse is explained. He should not use any more than one tablet in a 24 hour period. The side effects of possible headache, flushing, dyspepsia and transient changes in vision have been explained.   The patient is not taking nitrates, and denies he has access to nitrates in any form at any time. I have counseled him that taking Sildenifil with nitrates of any form can cause death. Additionally, Viagra serum concentrations can be increased by the following: cimetidine, erythromycin, itraconazole or ketoconazole. This patient does not take these drugs, but I have counseled him to avoid Viagra if he does take any of these.  We have also discussed the fact that there have been some deaths in patients after taking Sildenifil, felt due to the exertion of intercourse rather than the drug itself. The patient is aware of this, and accepts whatever unknown degree of risk there is in this aspect. Benign Prostatic Hypertrophy The prostate gland is part of the reproductive system of men. A normal prostate is about the size and shape of a walnut. The prostate gland produces a fluid that is mixed with sperm to make semen. This gland surrounds the urethra and is located in front of the rectum and just below the bladder. The bladder is where urine is stored. The urethra is the tube through which urine passes from the bladder to get out of the  body. The prostate grows as a man ages. An enlarged prostate not caused by cancer is called benign prostatic hypertrophy (BPH). An enlarged prostate can press on the urethra. This can make it harder to pass urine. In the early stages of enlargement, the bladder can get by with a narrowed urethra by forcing the urine through. If the problem gets worse, medical or surgical treatment may be required. This condition should be followed by your health care provider. The accumulation of urine in the bladder can cause infection. Back pressure and infection can progress to bladder damage and kidney (renal) failure. If needed, your health care provider may refer you to a specialist in kidney and prostate disease (urologist). What are the causes? BPH is a common health problem in men older than 50 years. This condition is a normal part of aging. However, not all men will develop problems from this condition. If the enlargement grows away from the urethra, then there will not be any compression of the urethra and resistance to urine flow.If the growth is toward the urethra and compresses it, you will experience difficulty urinating. What are the signs or symptoms?  Not able to completely empty your bladder.  Getting up often during the night to urinate.  Need to urinate frequently during the day.  Difficultly starting urine flow.  Decrease in size and strength of your urine stream.  Dribbling after urination.  Pain on urination (more common with infection).  Inability to pass urine. This needs immediate treatment.  The development of a  urinary tract infection. How is this diagnosed? These tests will help your health care provider understand your problem:  A thorough history and physical examination.  A urination history, with the number of times you urinate, the amounts of urine, the strength of the urine stream, and the feeling of emptiness or fullness after urinating.  A postvoid bladder scan  that measures any amount of urine that may remain in your bladder after you finish urinating.  Digital rectal exam. In a rectal exam, your health care provider checks your prostate by putting a gloved, lubricated finger into your rectum to feel the back of your prostate gland. This exam detects the size of your gland and abnormal lumps or growths.  Exam of your urine (urinalysis).  Prostate specific antigen (PSA) screening. This is a blood test used to screen for prostate cancer.  Rectal ultrasonography. This test uses sound waves to electronically produce a picture of your prostate gland.  How is this treated? Once symptoms begin, your health care provider will monitor your condition. Of the men with this condition, one third will have symptoms that stabilize, one third will have symptoms that improve, and one third will have symptoms that progress in the first year. Mild symptoms may not need treatment. Simple observation and yearly exams may be all that is required. Medicines and surgery are options for more severe problems. Your health care provider can help you make an informed decision for what is best. Two classes of medicines are available for relief of prostate symptoms:  Medicines that shrink the prostate. This helps relieve symptoms. These medicines take time to work, and it may be months before any improvement is seen. ? Uncommon side effects include problems with sexual function.  Medicines to relax the muscle of the prostate. This also relieves the obstruction by reducing any compression on the urethra.This group of medicines work much faster than those that reduce the size of the prostate gland. Usually, one can experience improvement in days to weeks.. ? Side effects can include dizziness, fatigue, lightheadedness, and retrograde ejaculation (diminished volume of ejaculate).  Several types of surgical treatments are available for relief of prostate symptoms:  Transurethral  resection of the prostate (TURP)-In this treatment, an instrument is inserted through opening at the tip of the penis. It is used to cut away pieces of the inner core of the prostate. The pieces are removed through the same opening of the penis. This removes the obstruction and helps get rid of the symptoms.  Transurethral incision (TUIP)-In this procedure, small cuts are made in the prostate. This lessens the prostates pressure on the urethra.  Transurethral microwave thermotherapy (TUMT)-This procedure uses microwaves to create heat. The heat destroys and removes a small amount of prostate tissue.  Transurethral needle ablation (TUNA)-This is a procedure that uses radio frequencies to do the same as TUMT.  Interstitial laser coagulation (ILC)-This is a procedure that uses a laser to do the same as TUMT and TUNA.  Transurethral electrovaporization (TUVP)-This is a procedure that uses electrodes to do the same as the procedures listed above.  Contact a health care provider if:  You develop a fever.  There is unexplained back pain.  Symptoms are not helped by medicines prescribed.  You develop side effects from the medicine you are taking.  Your urine becomes very dark or has a bad smell.  Your lower abdomen becomes distended and you have difficulty passing your urine. Get help right away if:  You are suddenly unable  to urinate. This is an emergency. You should be seen immediately.  There are large amounts of blood or clots in the urine.  Your urinary problems become unmanageable.  You develop lightheadedness, severe dizziness, or you feel faint.  You develop moderate to severe low back or flank pain.  You develop chills or fever. This information is not intended to replace advice given to you by your health care provider. Make sure you discuss any questions you have with your health care provider. Document Released: 01/20/2005 Document Revised: 07/04/2015 Document Reviewed:  08/05/2012 Elsevier Interactive Patient Education  2017 [employer].

## 2016-11-07 ENCOUNTER — Ambulatory Visit: Payer: Self-pay | Admitting: Family Medicine

## 2016-11-07 ENCOUNTER — Telehealth: Payer: Self-pay

## 2016-11-07 DIAGNOSIS — N186 End stage renal disease: Secondary | ICD-10-CM | POA: Diagnosis not present

## 2016-11-07 DIAGNOSIS — N2581 Secondary hyperparathyroidism of renal origin: Secondary | ICD-10-CM | POA: Diagnosis not present

## 2016-11-07 DIAGNOSIS — D509 Iron deficiency anemia, unspecified: Secondary | ICD-10-CM | POA: Diagnosis not present

## 2016-11-07 DIAGNOSIS — Z992 Dependence on renal dialysis: Secondary | ICD-10-CM | POA: Diagnosis not present

## 2016-11-07 LAB — LIPID PANEL
Cholesterol: 127 mg/dL (ref <200)
HDL: 35 mg/dL — ABNORMAL LOW (ref >40)
LDL Cholesterol (Calc): 74 mg/dL (calc)
NON-HDL CHOLESTEROL (CALC): 92 mg/dL (ref <130)
TRIGLYCERIDES: 99 mg/dL (ref <150)
Total CHOL/HDL Ratio: 3.6 (calc) (ref <5.0)

## 2016-11-07 LAB — EXTRA LAV TOP TUBE

## 2016-11-07 LAB — PSA: PSA: 0.8 ng/mL (ref <=4.0)

## 2016-11-07 NOTE — Telephone Encounter (Signed)
Called and left a message for Mr. Lisenbee that PSA is negative and that we weill re-check in 1 year. Asked that patient keep next scheduled follow up as scheduled. Thanks!

## 2016-11-07 NOTE — Telephone Encounter (Signed)
-----   Message from Dorena Dew, [place] sent at 11/07/2016  9:39 AM EDT ----- Regarding: lab results Please inform Mr. Sample that PSA is negative. Will recheck prostate in 1 year. Please follow up in clinic as scheduled.   Thanks ----- Message ----- From: Cheyenne Adas Lab Results In Sent: 11/06/2016   6:38 PM To: Dorena Dew, FNP

## 2016-11-09 NOTE — Progress Notes (Signed)
Jack Irwin, biehl  51 year old male with a history of end stage renal disease presents for is annual prostate exam.  Patient also complains of erectile dysfunction.  Onset of dysfunction was greater than 1 year ago. Patient has taken sildenafil with maximum relief. Patient mostly has partial erections without sildenafil. Patient is currently undergoing dialysis on Monday, Wednesday, and Fridays. Type 2 diabetes mellitus has been controlled without antidiabetic medications.  Past Medical History:  Diagnosis Date  . Anemia   . Arthritis   . BPH (benign prostatic hyperplasia)   . Chronic back pain   . Difficulty walking   . DVT (deep venous thrombosis) ([place])    Right popliteal DVT December 2017  . End-stage renal disease on hemodialysis ([place])    Dr. Lowanda Foster  . Erectile dysfunction   . Essential hypertension   . Gout   . Neuropathy, diabetic ([place])   . Type 2 diabetes mellitus ([place])   . Venous (peripheral) insufficiency    Social History   Social History  . Marital status: Married    Spouse name: N/A  . Number of children: N/A  . Years of education: N/A   Occupational History  . Not on file.   Social History Main Topics  . Smoking status: Never Smoker  . Smokeless tobacco: Never Used  . Alcohol use No  . Drug use: No  . Sexual activity: Not on file   Other Topics Concern  . Not on file   Social History Narrative  . No narrative on file   Immunization History  Administered Date(s) Administered  . PPD Test 02/17/2014   Allergies  Allergen Reactions  . Doxycycline Itching   Review of Systems  Constitutional: Negative for diaphoresis and malaise/fatigue.  HENT: Negative.   Eyes: Negative.   Respiratory: Negative.   Cardiovascular: Negative for chest pain.  Gastrointestinal: Negative.   Genitourinary: Negative.        Erectile dysfunction  Musculoskeletal: Negative.   Skin: Negative.   Neurological: Negative.  Negative for weakness.  Endo/Heme/Allergies:  Negative.   Psychiatric/Behavioral: Negative.   Physical Exam  HENT:  Head: Normocephalic.  Eyes: Pupils are equal, round, and reactive to light.  Neck: Normal range of motion. Neck supple.  Cardiovascular: Normal rate, regular rhythm and normal heart sounds.   Pulmonary/Chest: Effort normal and breath sounds normal.  Abdominal: Soft. Bowel sounds are normal.  Genitourinary: Testes/scrotum normal and penis normal. Rectal exam shows no external hemorrhoid, no internal hemorrhoid, no fissure, no laceration, no mass, no tenderness and guaiac negative stool. Prostate is enlarged. Prostate is not tender.  Neurological: He is alert. Gait normal.  Skin: Skin is warm and dry.  Skin dry, flaky, and ashen  Psychiatric: Mood, memory, affect and judgment normal.  BP 106/78 (BP Location: Left Arm, Patient Position: Sitting, Cuff Size: Large)   Pulse (!) 116   Temp 97.9 F (36.6 C) (Oral)   Resp 18   Ht 5\' 11"  (1.803 m)   Wt 260 lb (117.9 kg)   SpO2 98%   BMI 36.26 kg/m    1. Arthritis, gouty - ULORIC 40 MG tablet; Take 1 tablet (40 mg total) by mouth daily.  Dispense: 30 tablet; Refill: 5  2. Prostate cancer screening Enlarged prostate, no tenderness noted   3. ESRD on dialysis [facility]) Continue dialysis Monday, Wednesday, and Friday.   4. Enlarged prostate - PSA  5. Type 2 diabetes mellitus with chronic kidney disease on chronic dialysis, without long-term current use of insulin ([place] -  HgB A1c - Lipid Panel    6. Erectile dysfunction, unspecified erectile dysfunction type - sildenafil (REVATIO) 20 MG tablet; Take 1 tablet (20 mg total) by mouth daily as needed. Dissolved under tongue (TROCHE) 60 minutes prior to sexual encounter  Dispense: 10 tablet; Refill: [place]  MSN, FNP-C Patient [place] [address] [place], [place] [address] [phone]  .

## 2016-11-12 ENCOUNTER — Telehealth: Payer: Self-pay | Admitting: Family Medicine

## 2016-11-12 DIAGNOSIS — N2581 Secondary hyperparathyroidism of renal origin: Secondary | ICD-10-CM | POA: Diagnosis not present

## 2016-11-12 DIAGNOSIS — Z992 Dependence on renal dialysis: Secondary | ICD-10-CM | POA: Diagnosis not present

## 2016-11-12 DIAGNOSIS — N186 End stage renal disease: Secondary | ICD-10-CM | POA: Diagnosis not present

## 2016-11-12 DIAGNOSIS — D509 Iron deficiency anemia, unspecified: Secondary | ICD-10-CM | POA: Diagnosis not present

## 2016-11-12 DIAGNOSIS — T82868A Thrombosis of vascular prosthetic devices, implants and grafts, initial encounter: Secondary | ICD-10-CM | POA: Diagnosis not present

## 2016-11-12 DIAGNOSIS — I871 Compression of vein: Secondary | ICD-10-CM | POA: Diagnosis not present

## 2016-11-12 NOTE — Telephone Encounter (Signed)
Jack Irwin, a 51 year old male called requesting an order for a walker. Patient advised to schedule an appointment for a functionality assessment and possible physical therapy referral.   [name]  MSN, FNP-C Patient [facility] [address] [place], [place] [address] [phone]

## 2016-11-15 DIAGNOSIS — N186 End stage renal disease: Secondary | ICD-10-CM | POA: Diagnosis not present

## 2016-11-15 DIAGNOSIS — D509 Iron deficiency anemia, unspecified: Secondary | ICD-10-CM | POA: Diagnosis not present

## 2016-11-15 DIAGNOSIS — Z992 Dependence on renal dialysis: Secondary | ICD-10-CM | POA: Diagnosis not present

## 2016-11-15 DIAGNOSIS — N2581 Secondary hyperparathyroidism of renal origin: Secondary | ICD-10-CM | POA: Diagnosis not present

## 2016-11-17 DIAGNOSIS — N2581 Secondary hyperparathyroidism of renal origin: Secondary | ICD-10-CM | POA: Diagnosis not present

## 2016-11-17 DIAGNOSIS — Z992 Dependence on renal dialysis: Secondary | ICD-10-CM | POA: Diagnosis not present

## 2016-11-17 DIAGNOSIS — D509 Iron deficiency anemia, unspecified: Secondary | ICD-10-CM | POA: Diagnosis not present

## 2016-11-17 DIAGNOSIS — N186 End stage renal disease: Secondary | ICD-10-CM | POA: Diagnosis not present

## 2016-11-19 DIAGNOSIS — N2581 Secondary hyperparathyroidism of renal origin: Secondary | ICD-10-CM | POA: Diagnosis not present

## 2016-11-19 DIAGNOSIS — D509 Iron deficiency anemia, unspecified: Secondary | ICD-10-CM | POA: Diagnosis not present

## 2016-11-19 DIAGNOSIS — Z992 Dependence on renal dialysis: Secondary | ICD-10-CM | POA: Diagnosis not present

## 2016-11-19 DIAGNOSIS — N186 End stage renal disease: Secondary | ICD-10-CM | POA: Diagnosis not present

## 2016-11-21 ENCOUNTER — Other Ambulatory Visit: Payer: Self-pay | Admitting: Urology

## 2016-11-21 DIAGNOSIS — D3000 Benign neoplasm of unspecified kidney: Secondary | ICD-10-CM

## 2016-11-21 DIAGNOSIS — N186 End stage renal disease: Secondary | ICD-10-CM | POA: Diagnosis not present

## 2016-11-21 DIAGNOSIS — N2581 Secondary hyperparathyroidism of renal origin: Secondary | ICD-10-CM | POA: Diagnosis not present

## 2016-11-21 DIAGNOSIS — N200 Calculus of kidney: Secondary | ICD-10-CM

## 2016-11-21 DIAGNOSIS — Z992 Dependence on renal dialysis: Secondary | ICD-10-CM | POA: Diagnosis not present

## 2016-11-21 DIAGNOSIS — D509 Iron deficiency anemia, unspecified: Secondary | ICD-10-CM | POA: Diagnosis not present

## 2016-11-24 DIAGNOSIS — Z992 Dependence on renal dialysis: Secondary | ICD-10-CM | POA: Diagnosis not present

## 2016-11-24 DIAGNOSIS — N2581 Secondary hyperparathyroidism of renal origin: Secondary | ICD-10-CM | POA: Diagnosis not present

## 2016-11-24 DIAGNOSIS — D509 Iron deficiency anemia, unspecified: Secondary | ICD-10-CM | POA: Diagnosis not present

## 2016-11-24 DIAGNOSIS — N186 End stage renal disease: Secondary | ICD-10-CM | POA: Diagnosis not present

## 2016-11-26 DIAGNOSIS — Z992 Dependence on renal dialysis: Secondary | ICD-10-CM | POA: Diagnosis not present

## 2016-11-26 DIAGNOSIS — D509 Iron deficiency anemia, unspecified: Secondary | ICD-10-CM | POA: Diagnosis not present

## 2016-11-26 DIAGNOSIS — N186 End stage renal disease: Secondary | ICD-10-CM | POA: Diagnosis not present

## 2016-11-26 DIAGNOSIS — N2581 Secondary hyperparathyroidism of renal origin: Secondary | ICD-10-CM | POA: Diagnosis not present

## 2016-11-27 ENCOUNTER — Encounter: Payer: Self-pay | Admitting: Family Medicine

## 2016-11-27 ENCOUNTER — Ambulatory Visit (INDEPENDENT_AMBULATORY_CARE_PROVIDER_SITE_OTHER): Payer: Medicare Other | Admitting: Family Medicine

## 2016-11-27 VITALS — BP 123/61 | HR 110 | Temp 98.5°F | Resp 16 | Ht 71.0 in | Wt 261.0 lb

## 2016-11-27 DIAGNOSIS — R531 Weakness: Secondary | ICD-10-CM

## 2016-11-27 DIAGNOSIS — G629 Polyneuropathy, unspecified: Secondary | ICD-10-CM

## 2016-11-27 DIAGNOSIS — R220 Localized swelling, mass and lump, head: Secondary | ICD-10-CM | POA: Diagnosis not present

## 2016-11-27 DIAGNOSIS — K0889 Other specified disorders of teeth and supporting structures: Secondary | ICD-10-CM

## 2016-11-27 MED ORDER — AMOXICILLIN 250 MG PO CAPS: 250.0000 mg | ORAL_CAPSULE | Freq: Two times a day (BID) | ORAL | 0 refills | Status: AC

## 2016-11-27 NOTE — Patient Instructions (Addendum)
Recommend a physical therapy evaluation due to worsening weakness.  On observation patient has an abnormal gait.  Patient is definitely a high fall risk.  I recommend a physical therapy evaluation.  Patient may benefit from a walker but will more than likely benefit more from ongoing physical therapy sessions.  Patient appears to be deconditioned from sedentary lifestyle.  I will defer to physical therapy for further treatment and evaluation. Will not send order for a walker on today will await physical therapy's recommendations    Dental pain and gum inflammation.  Complete Amoxicillin as prescribed Tylenol 500 mg every 6 hours as needed for mild to moderate dental pain  Schedule a dental appointment Dr. Lutricia Feil [phone]

## 2016-11-28 DIAGNOSIS — N50811 Right testicular pain: Secondary | ICD-10-CM | POA: Diagnosis not present

## 2016-11-28 NOTE — Progress Notes (Signed)
[facility]  Subjective:    Patient ID: Jack Irwin, male    DOB: [birth year], 51 y.o.   MRN: [id number]  HPI Jack Irwin, a 51 year old male with a history of rheumatoid arthritis and end stage renal disease presents requesting a walker due to increased weakness. He states that it has been difficult to stand and walk long distances due to increasing weakness. He also has a history of neuropathy related to diabetes. He no longer feels comfortable walking long distances without assistance because he is afraid of falling. Weakness is primarily in hands and lower extremities. He can no longer lift items greater than 20 pounds. He denies any recent falls.   [name] is also complaining of dental pain that has been increasing over the past several weeks. He endorses swelling to face and gums. He has not attempted any OTC medication to alleviate pain due to ESRD. Current pain intensity is 3/10 described as aching. .   Past Medical History:  Diagnosis Date  . Anemia   . Arthritis   . BPH (benign prostatic hyperplasia)   . Chronic back pain   . Difficulty walking   . DVT (deep venous thrombosis) ([place])    Right popliteal DVT December 2017  . End-stage renal disease on hemodialysis ([place])    Dr. Lowanda Foster  . Erectile dysfunction   . Essential hypertension   . Gout   . Neuropathy, diabetic ([place])   . Type 2 diabetes mellitus ([place])   . Venous (peripheral) insufficiency    Review of Systems  HENT: Positive for dental problem.   Cardiovascular: Negative.   Gastrointestinal: Negative.   Endocrine: Negative for polydipsia, polyphagia and polyuria.  Genitourinary: Negative.   Musculoskeletal: Positive for gait problem and myalgias.  Skin: Negative.   Neurological: Positive for weakness and numbness (lower extremities). Negative for dizziness and speech difficulty.       Abnormal gait  Hematological: Negative.   Psychiatric/Behavioral: Negative.        Objective:   Physical Exam  Constitutional: He appears well-developed.  HENT:  Head: Normocephalic and atraumatic.  Right Ear: External ear normal.  Left Ear: External ear normal.  Nose: Nose normal.  Mouth/Throat: Oropharynx is clear and moist. Abnormal dentition. Dental caries present.  Swelling and erythema to right upper gumline  Eyes: Pupils are equal, round, and reactive to light.  Neck: Normal range of motion.  Cardiovascular: Normal rate, regular rhythm, normal heart sounds and intact distal pulses.   Pulmonary/Chest: Effort normal and breath sounds normal.  Abdominal: Soft. Bowel sounds are normal.  Increased abdominal girth, pendulous.   Musculoskeletal:  3/5 weakness to upper extremities   Neurological: No cranial nerve deficit. He exhibits abnormal muscle tone. Coordination and gait abnormal.  Skin:  Dry, ashen     BP 123/61 (BP Location: Left Arm, Patient Position: Sitting, Cuff Size: Large)   Pulse (!) 110   Temp 98.5 F (36.9 C) (Oral)   Resp 16   Ht 5\' 11"  (1.803 m)   Wt 261 lb (118.4 kg)   SpO2 98%   BMI 36.40 kg/m  Assessment & Plan:  1. Weakness Recommend a physical therapy evaluation due to worsening weakness.  On observation patient has an abnormal gait.  Patient is definitely a high fall risk.  I recommend a physical therapy evaluation.  Patient may benefit from a walker but will more than likely benefit more from ongoing physical therapy sessions.  Patient appears to be deconditioned from sedentary lifestyle.  I will defer to physical therapy for further treatment and evaluation.  Will not send order for a walker on today will await physical therapy's recommendations. - Ambulatory referral to Physical Therapy  2. Neuropathy  - Ambulatory referral to Physical Therapy  3. Swelling of right side of face Patient has increased swelling and erythema along the right gumline.  I will start amoxicillin 250 mg by mouth twice daily which is renally adjusted due to end-stage  kidney disease.  Recommend that patient follows up with dentist within 1 week. Given information for Dr. Lutricia Feil, general dentistry. Recommend Tylenol 325 mg every 6 hours as needed for mild to moderate pain. Apply warm moist compresses to right face as needed - amoxicillin (AMOXIL) 250 MG capsule; Take 1 capsule (250 mg total) by mouth 2 (two) times daily.  Dispense: 20 capsule; Refill: 0  4. Pain, dental Tylenol 325 mg every 6 hours as needed for mild to moderate pain. - amoxicillin (AMOXIL) 250 MG capsule; Take 1 capsule (250 mg total) by mouth 2 (two) times daily.  Dispense: 20 capsule; Refill: 0   RTC: As previously scheduled   [name]  MSN, FNP-C Patient [facility] [address] [place], [place] [address] [phone]

## 2016-11-29 DIAGNOSIS — N186 End stage renal disease: Secondary | ICD-10-CM | POA: Diagnosis not present

## 2016-11-29 DIAGNOSIS — D509 Iron deficiency anemia, unspecified: Secondary | ICD-10-CM | POA: Diagnosis not present

## 2016-11-29 DIAGNOSIS — N2581 Secondary hyperparathyroidism of renal origin: Secondary | ICD-10-CM | POA: Diagnosis not present

## 2016-11-29 DIAGNOSIS — Z992 Dependence on renal dialysis: Secondary | ICD-10-CM | POA: Diagnosis not present

## 2016-12-01 DIAGNOSIS — N2581 Secondary hyperparathyroidism of renal origin: Secondary | ICD-10-CM | POA: Diagnosis not present

## 2016-12-01 DIAGNOSIS — Z992 Dependence on renal dialysis: Secondary | ICD-10-CM | POA: Diagnosis not present

## 2016-12-01 DIAGNOSIS — D509 Iron deficiency anemia, unspecified: Secondary | ICD-10-CM | POA: Diagnosis not present

## 2016-12-01 DIAGNOSIS — N186 End stage renal disease: Secondary | ICD-10-CM | POA: Diagnosis not present

## 2016-12-02 ENCOUNTER — Telehealth: Payer: Self-pay

## 2016-12-02 NOTE — Telephone Encounter (Signed)
Spoke with patient. Asked him to call pharmacy since this was sent in on 11/06/2016 with 5 refills. Thanks!

## 2016-12-03 DIAGNOSIS — N186 End stage renal disease: Secondary | ICD-10-CM | POA: Diagnosis not present

## 2016-12-03 DIAGNOSIS — D509 Iron deficiency anemia, unspecified: Secondary | ICD-10-CM | POA: Diagnosis not present

## 2016-12-03 DIAGNOSIS — N2581 Secondary hyperparathyroidism of renal origin: Secondary | ICD-10-CM | POA: Diagnosis not present

## 2016-12-03 DIAGNOSIS — Z992 Dependence on renal dialysis: Secondary | ICD-10-CM | POA: Diagnosis not present

## 2016-12-04 ENCOUNTER — Ambulatory Visit (HOSPITAL_COMMUNITY): Payer: Medicare Other

## 2016-12-05 DIAGNOSIS — N186 End stage renal disease: Secondary | ICD-10-CM | POA: Diagnosis not present

## 2016-12-05 DIAGNOSIS — R112 Nausea with vomiting, unspecified: Secondary | ICD-10-CM | POA: Diagnosis not present

## 2016-12-05 DIAGNOSIS — D509 Iron deficiency anemia, unspecified: Secondary | ICD-10-CM | POA: Diagnosis not present

## 2016-12-05 DIAGNOSIS — N2581 Secondary hyperparathyroidism of renal origin: Secondary | ICD-10-CM | POA: Diagnosis not present

## 2016-12-05 DIAGNOSIS — Z992 Dependence on renal dialysis: Secondary | ICD-10-CM | POA: Diagnosis not present

## 2016-12-08 DIAGNOSIS — Z992 Dependence on renal dialysis: Secondary | ICD-10-CM | POA: Diagnosis not present

## 2016-12-08 DIAGNOSIS — N186 End stage renal disease: Secondary | ICD-10-CM | POA: Diagnosis not present

## 2016-12-08 DIAGNOSIS — N2581 Secondary hyperparathyroidism of renal origin: Secondary | ICD-10-CM | POA: Diagnosis not present

## 2016-12-08 DIAGNOSIS — R112 Nausea with vomiting, unspecified: Secondary | ICD-10-CM | POA: Diagnosis not present

## 2016-12-08 DIAGNOSIS — D509 Iron deficiency anemia, unspecified: Secondary | ICD-10-CM | POA: Diagnosis not present

## 2016-12-09 ENCOUNTER — Ambulatory Visit (HOSPITAL_COMMUNITY): Admission: RE | Admit: 2016-12-09 | Payer: Medicare Other | Source: Ambulatory Visit

## 2016-12-10 DIAGNOSIS — Z992 Dependence on renal dialysis: Secondary | ICD-10-CM | POA: Diagnosis not present

## 2016-12-10 DIAGNOSIS — N186 End stage renal disease: Secondary | ICD-10-CM | POA: Diagnosis not present

## 2016-12-10 DIAGNOSIS — D509 Iron deficiency anemia, unspecified: Secondary | ICD-10-CM | POA: Diagnosis not present

## 2016-12-10 DIAGNOSIS — N2581 Secondary hyperparathyroidism of renal origin: Secondary | ICD-10-CM | POA: Diagnosis not present

## 2016-12-10 DIAGNOSIS — R112 Nausea with vomiting, unspecified: Secondary | ICD-10-CM | POA: Diagnosis not present

## 2016-12-11 ENCOUNTER — Ambulatory Visit (HOSPITAL_COMMUNITY)
Admission: RE | Admit: 2016-12-11 | Discharge: 2016-12-11 | Disposition: A | Discharge status: Home / Self Care | Payer: Medicare Other | Source: Ambulatory Visit | Attending: Urology | Admitting: Urology

## 2016-12-11 DIAGNOSIS — D3000 Benign neoplasm of unspecified kidney: Secondary | ICD-10-CM | POA: Diagnosis not present

## 2016-12-11 DIAGNOSIS — N200 Calculus of kidney: Secondary | ICD-10-CM | POA: Diagnosis not present

## 2016-12-11 DIAGNOSIS — N261 Atrophy of kidney (terminal): Secondary | ICD-10-CM | POA: Insufficient documentation

## 2016-12-11 DIAGNOSIS — C649 Malignant neoplasm of unspecified kidney, except renal pelvis: Secondary | ICD-10-CM | POA: Diagnosis not present

## 2016-12-12 DIAGNOSIS — D509 Iron deficiency anemia, unspecified: Secondary | ICD-10-CM | POA: Diagnosis not present

## 2016-12-12 DIAGNOSIS — N186 End stage renal disease: Secondary | ICD-10-CM | POA: Diagnosis not present

## 2016-12-12 DIAGNOSIS — R112 Nausea with vomiting, unspecified: Secondary | ICD-10-CM | POA: Diagnosis not present

## 2016-12-12 DIAGNOSIS — N2581 Secondary hyperparathyroidism of renal origin: Secondary | ICD-10-CM | POA: Diagnosis not present

## 2016-12-12 DIAGNOSIS — Z992 Dependence on renal dialysis: Secondary | ICD-10-CM | POA: Diagnosis not present

## 2016-12-15 ENCOUNTER — Telehealth: Payer: Self-pay

## 2016-12-15 DIAGNOSIS — Z7901 Long term (current) use of anticoagulants: Secondary | ICD-10-CM

## 2016-12-15 DIAGNOSIS — Z992 Dependence on renal dialysis: Secondary | ICD-10-CM | POA: Diagnosis not present

## 2016-12-15 DIAGNOSIS — D509 Iron deficiency anemia, unspecified: Secondary | ICD-10-CM | POA: Diagnosis not present

## 2016-12-15 DIAGNOSIS — N2581 Secondary hyperparathyroidism of renal origin: Secondary | ICD-10-CM | POA: Diagnosis not present

## 2016-12-15 DIAGNOSIS — N186 End stage renal disease: Secondary | ICD-10-CM | POA: Diagnosis not present

## 2016-12-15 DIAGNOSIS — R112 Nausea with vomiting, unspecified: Secondary | ICD-10-CM | POA: Diagnosis not present

## 2016-12-16 MED ORDER — APIXABAN 2.5 MG PO TABS: 2.5000 mg | ORAL_TABLET | Freq: Two times a day (BID) | ORAL | 5 refills | Status: DC

## 2016-12-16 NOTE — Telephone Encounter (Signed)
Refill for eliquis sent into pharmacy. Thanks!

## 2016-12-17 ENCOUNTER — Ambulatory Visit (INDEPENDENT_AMBULATORY_CARE_PROVIDER_SITE_OTHER): Payer: Medicare Other | Admitting: Urology

## 2016-12-17 ENCOUNTER — Other Ambulatory Visit: Payer: Self-pay | Admitting: Family Medicine

## 2016-12-17 DIAGNOSIS — R102 Pelvic and perineal pain: Secondary | ICD-10-CM

## 2016-12-17 DIAGNOSIS — N186 End stage renal disease: Secondary | ICD-10-CM | POA: Diagnosis not present

## 2016-12-17 DIAGNOSIS — R112 Nausea with vomiting, unspecified: Secondary | ICD-10-CM | POA: Diagnosis not present

## 2016-12-17 DIAGNOSIS — Z992 Dependence on renal dialysis: Secondary | ICD-10-CM | POA: Diagnosis not present

## 2016-12-17 DIAGNOSIS — N2581 Secondary hyperparathyroidism of renal origin: Secondary | ICD-10-CM | POA: Diagnosis not present

## 2016-12-17 DIAGNOSIS — D509 Iron deficiency anemia, unspecified: Secondary | ICD-10-CM | POA: Diagnosis not present

## 2016-12-19 ENCOUNTER — Telehealth: Payer: Self-pay

## 2016-12-19 DIAGNOSIS — M109 Gout, unspecified: Secondary | ICD-10-CM

## 2016-12-19 DIAGNOSIS — Z992 Dependence on renal dialysis: Secondary | ICD-10-CM | POA: Diagnosis not present

## 2016-12-19 DIAGNOSIS — R112 Nausea with vomiting, unspecified: Secondary | ICD-10-CM | POA: Diagnosis not present

## 2016-12-19 DIAGNOSIS — N186 End stage renal disease: Secondary | ICD-10-CM | POA: Diagnosis not present

## 2016-12-19 DIAGNOSIS — N2581 Secondary hyperparathyroidism of renal origin: Secondary | ICD-10-CM | POA: Diagnosis not present

## 2016-12-19 DIAGNOSIS — D509 Iron deficiency anemia, unspecified: Secondary | ICD-10-CM | POA: Diagnosis not present

## 2016-12-22 DIAGNOSIS — R112 Nausea with vomiting, unspecified: Secondary | ICD-10-CM | POA: Diagnosis not present

## 2016-12-22 DIAGNOSIS — N2581 Secondary hyperparathyroidism of renal origin: Secondary | ICD-10-CM | POA: Diagnosis not present

## 2016-12-22 DIAGNOSIS — D509 Iron deficiency anemia, unspecified: Secondary | ICD-10-CM | POA: Diagnosis not present

## 2016-12-22 DIAGNOSIS — N186 End stage renal disease: Secondary | ICD-10-CM | POA: Diagnosis not present

## 2016-12-22 DIAGNOSIS — Z992 Dependence on renal dialysis: Secondary | ICD-10-CM | POA: Diagnosis not present

## 2016-12-22 MED ORDER — ULORIC 40 MG PO TABS: 40.0000 mg | ORAL_TABLET | Freq: Every day | ORAL | 5 refills | Status: DC

## 2016-12-22 NOTE — Telephone Encounter (Signed)
Refill for uloric sent into pharmacy. Thanks!  

## 2016-12-23 ENCOUNTER — Encounter (HOSPITAL_COMMUNITY): Payer: Self-pay

## 2016-12-23 ENCOUNTER — Ambulatory Visit (HOSPITAL_COMMUNITY): Payer: Medicare Other | Attending: Family Medicine

## 2016-12-23 DIAGNOSIS — R2689 Other abnormalities of gait and mobility: Secondary | ICD-10-CM | POA: Insufficient documentation

## 2016-12-23 DIAGNOSIS — M6281 Muscle weakness (generalized): Secondary | ICD-10-CM | POA: Insufficient documentation

## 2016-12-23 DIAGNOSIS — Z7409 Other reduced mobility: Secondary | ICD-10-CM

## 2016-12-23 IMAGING — US US EXTREM LOW VENOUS*R*
1 series · 13 of 24 positions shown · non-contrast
Comparison: None.

CLINICAL DATA: 50-year-old male with a history of right lower
extremity pain and swelling



[Series 1: us extrem low venous*right* · 0.08mm/px · 13 of 37 slices shown]
[im 1/37]
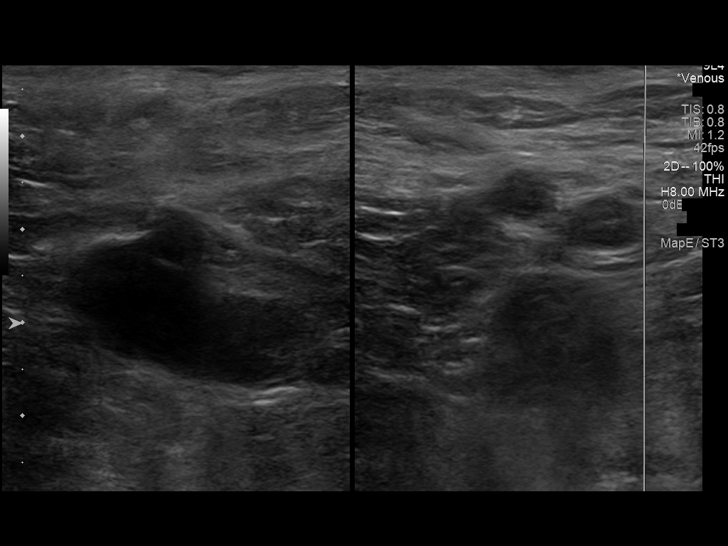
[im 4/37]
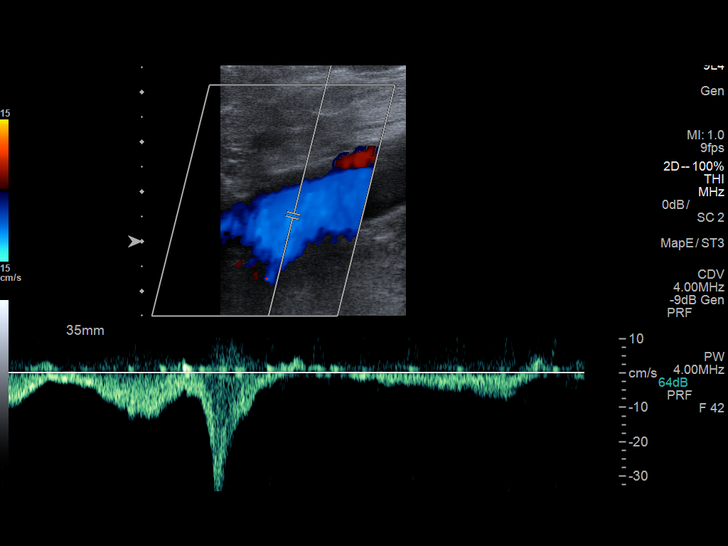
[im 7/37]
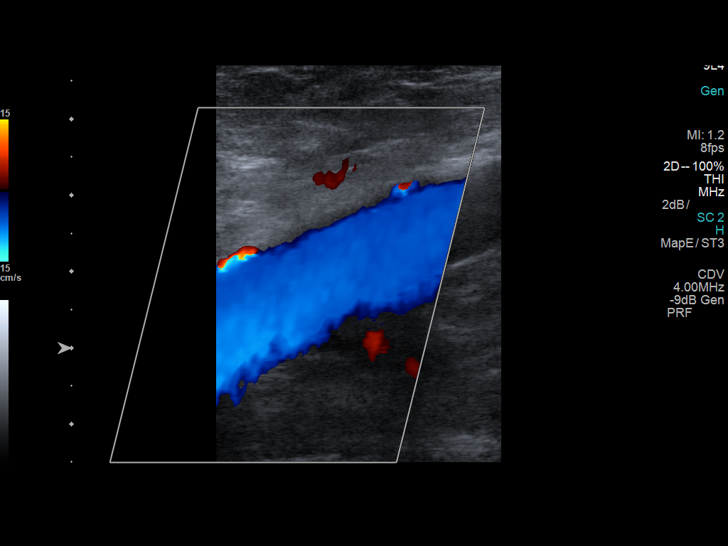
[im 10/37]
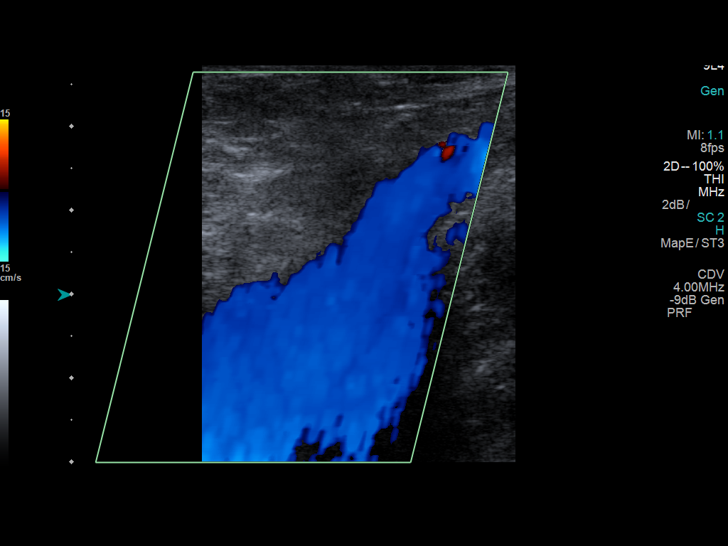
[im 13/37]
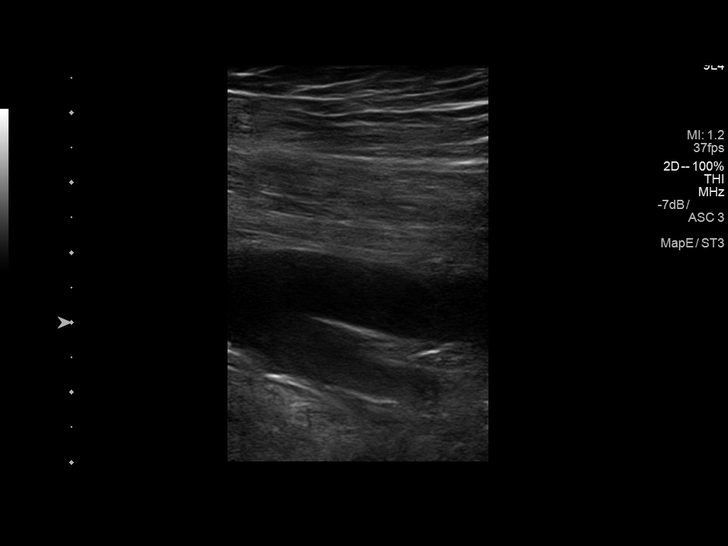
[im 16/37]
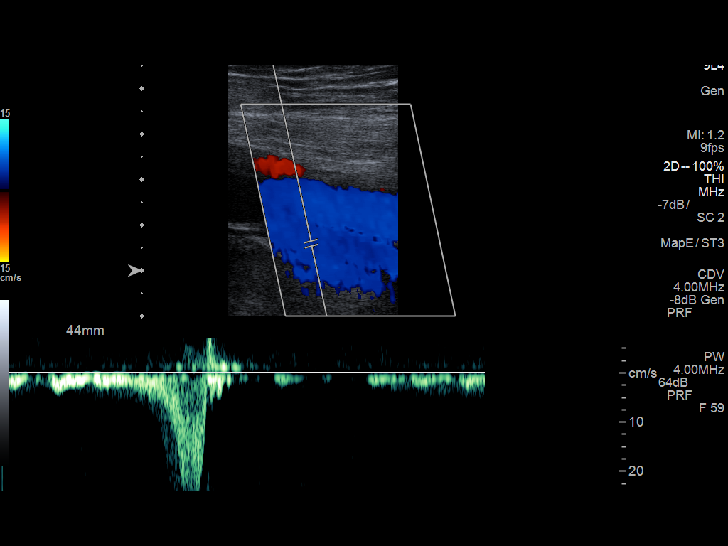
[im 19/37]
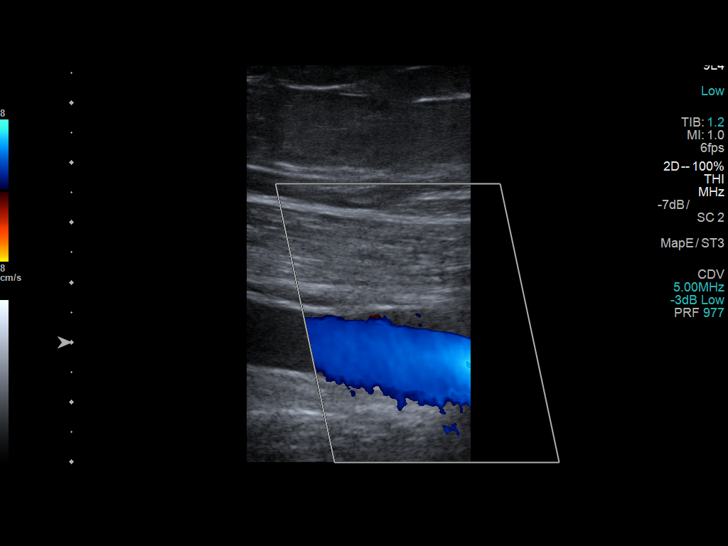
[im 21/37]
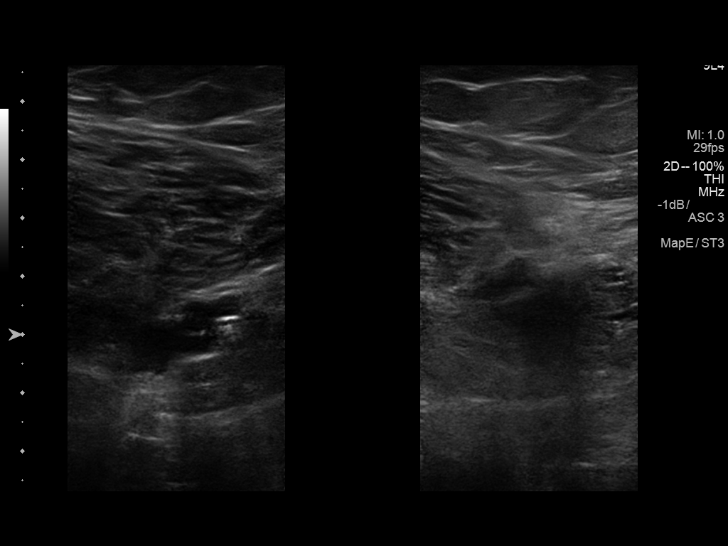
[im 24/37]
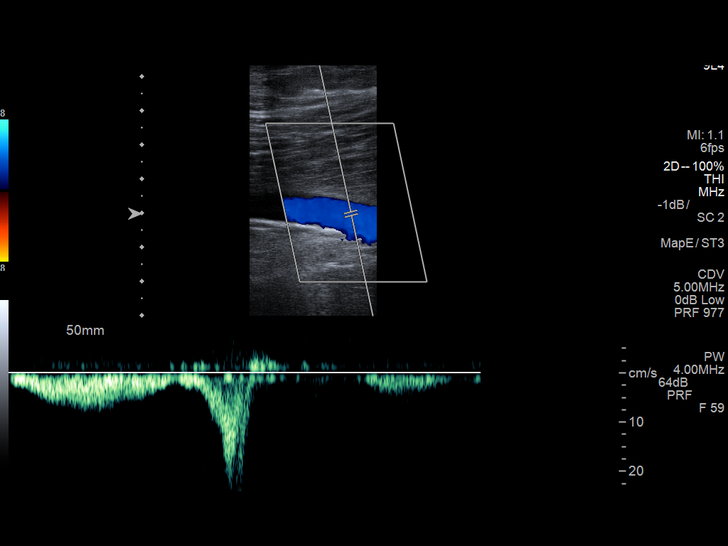
[im 27/37]
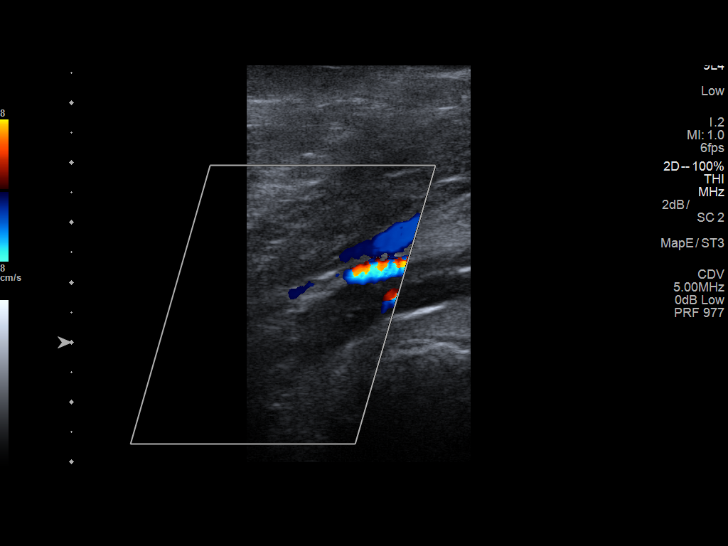
[im 30/37]
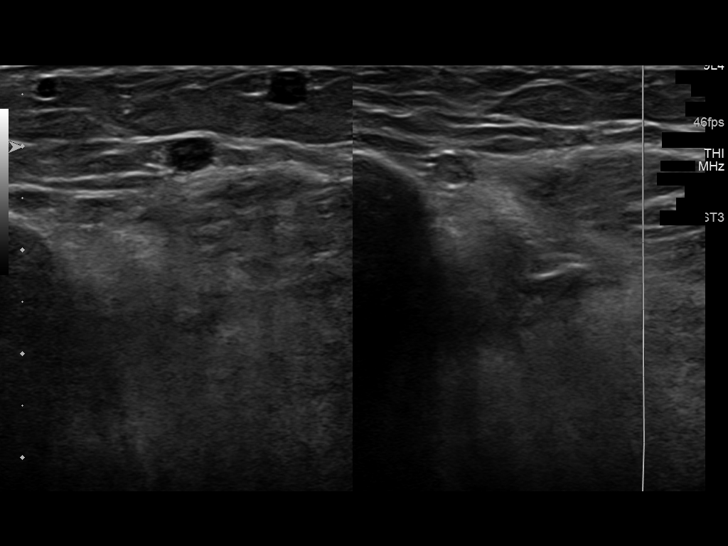
[im 33/37]
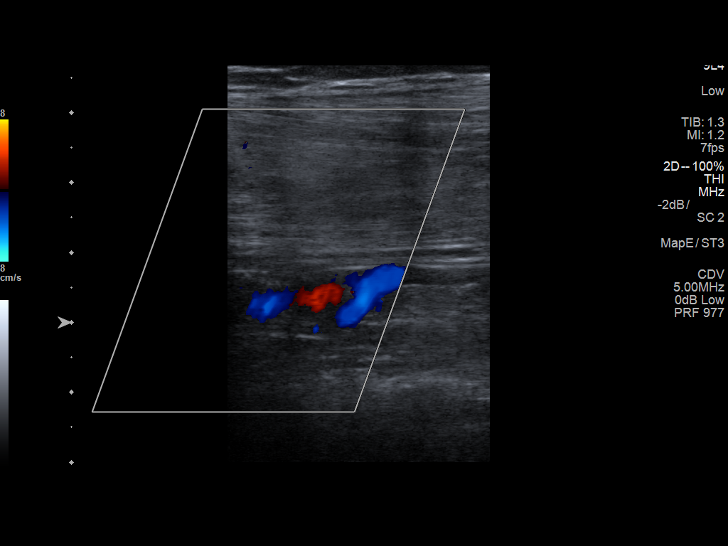
[im 37/37]
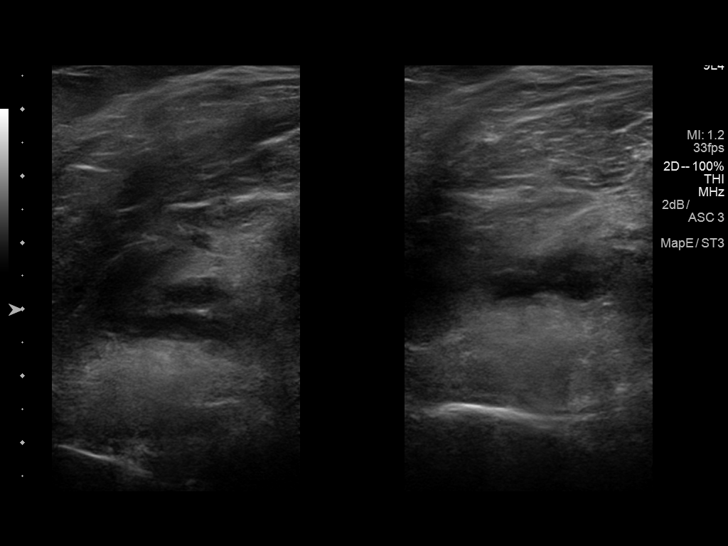

[13 of 24 positions shown; findings below may reference images not displayed]

FINDINGS: Contralateral Common Femoral Vein: Respiratory phasicity is normal
and symmetric with the symptomatic side. No evidence of thrombus.
Normal compressibility.

Common Femoral Vein: No evidence of thrombus. Normal
compressibility, respiratory phasicity and response to augmentation.

Saphenofemoral Junction: No evidence of thrombus. Normal
compressibility and flow on color Doppler imaging.

Profunda Femoral Vein: No evidence of thrombus. Normal
compressibility and flow on color Doppler imaging.

Femoral Vein: No evidence of thrombus. Normal compressibility,
respiratory phasicity and response to augmentation.

Popliteal Vein: Nonocclusive thrombus of the popliteal vein which
likely extends into the peroneal vein.

Calf Veins: No thrombus identified within the posterior tibial vein.
Peroneal vein is likely and vault with DVT.

Other Findings:  None.
IMPRESSION: Sonographic survey positive for nonocclusive DVT of the popliteal
vein, likely extending into the peroneal vein.

These results were called to the referring physician at the time of
interpretation by the technologist performing the study.

## 2016-12-23 NOTE — Therapy (Signed)
[place], [place], [address] Phone: [phone]   Fax:  [phone]  Physical Therapy Evaluation  Patient Details  Name: Jack Irwin MRN: [id number] Date of Birth: 05/21/1965 Referring Provider: Dorena Dew FNP   Encounter Date: 12/23/2016  PT End of Session - 12/23/16 1156    Visit Number  1    Number of Visits  11    Date for PT Re-Evaluation  01/20/17    Authorization Type  Medicare Part A     Authorization Time Period  12/23/2016-12/31/2016    Authorization - Visit Number  1    Authorization - Number of Visits  10    PT Start Time  0945    PT Stop Time  1025    PT Time Calculation (min)  40 min    Activity Tolerance  Patient tolerated treatment well    Behavior During Therapy  [facility] for tasks assessed/performed       Past Medical History:  Diagnosis Date  . Anemia   . Arthritis   . BPH (benign prostatic hyperplasia)   . Chronic back pain   . Difficulty walking   . DVT (deep venous thrombosis) ([place])    Right popliteal DVT December 2017  . End-stage renal disease on hemodialysis ([place])    Dr. Lowanda Foster  . Erectile dysfunction   . Essential hypertension   . Gout   . Neuropathy, diabetic ([place])   . Type 2 diabetes mellitus ([place])   . Venous (peripheral) insufficiency     Past Surgical History:  Procedure Laterality Date  . AV FISTULA PLACEMENT Left 03/09/2014   Procedure: INSERTION OF ARTERIOVENOUS (AV) GORE-TEX GRAFT ARM;  Surgeon: Angelia Mould, MD;  Location: [facility] OR;  Service: Vascular;  Laterality: Left;  . AV FISTULA PLACEMENT Right 05/06/2016   Procedure: INSERTION OF ARTERIOVENOUS (AV) GORE-TEX GRAFT  Right ARM;  Surgeon: Elam Dutch, MD;  Location: [facility];  Service: Vascular;  Laterality: Right;  . CYST EXCISION Right    cyst removed on thumb 2001  . INSERTION OF DIALYSIS CATHETER    . INSERTION OF DIALYSIS CATHETER Left 03/09/2014   Procedure: INSERTION OF DIALYSIS  CATHETER;  Surgeon: Angelia Mould, MD;  Location: [place];  Service: Vascular;  Laterality: Left;  . IR THROMBECTOMY AV FISTULA W/THROMBOLYSIS/PTA INC/SHUNT/IMG RIGHT Right 11/01/2016  . IR US GUIDE VASC ACCESS RIGHT  11/01/2016  . IRRIGATION AND DEBRIDEMENT ABSCESS N/A 02/25/2016   Procedure: IRRIGATION AND DEBRIDEMENT SCROTAL ABSCESS;  Surgeon: Nickie Retort, MD;  Location: [facility];  Service: Urology;  Laterality: N/A;    There were no vitals filed for this visit.   Subjective Assessment - 12/23/16 0949    Subjective  Patient notes he is walking bent over and just not working good. He reports he is getting out of breath with walking and is stiff all the time. He reports burning of the calf and feet also.     Pertinent History  Bilateral neuropathy, Rhematoid arthritis and end stage renal disease    Limitations  House hold activities    Patient Stated Goals  To improve posture during ambulation and strength    Currently in Pain?  Yes    Pain Score  5     Pain Location  Back    Pain Orientation  Right    Pain Descriptors / Indicators  Sharp    Pain Type  Chronic pain    Pain Onset  More than a month ago    Pain Frequency  Intermittent    Aggravating Factors   Walking          [facility] PT Assessment - 12/23/16 0001      Assessment   Medical Diagnosis  Neuropathy, weakness    Referring Provider  Dorena Dew FNP    Prior Therapy  yes      Balance Screen   Has the patient fallen in the past 6 months  No    Has the patient had a decrease in activity level because of a fear of falling?   No    Is the patient reluctant to leave their home because of a fear of falling?   No      Cognition   Overall Cognitive Status  Within Functional Limits for tasks assessed      Posture/Postural Control   Posture/Postural Control  Postural limitations    Postural Limitations  Rounded Shoulders;Forward head;Flexed trunk      AROM   Overall AROM   Within functional limits for tasks  performed      Strength   Overall Strength Comments  Overall 4+/5 bilateral LE    Right Hip Flexion  4/5    Right Hip ABduction  3+/5    Left Hip Flexion  4/5    Left Hip ABduction  3+/5      Flexibility   Hamstrings  minimal limitation      Transfers   Five time sit to stand comments   2 reps, no UE; 13.88 5 reps with UE use    Comments  inability to complete without UE use, requires momentum to complete. Unable to complete eccentric control       Ambulation/Gait   Ambulation Distance (Feet)  448 Feet 3MWT    Assistive device  None    Gait Pattern  Trunk flexed;Decreased weight shift to left      Static Standing Balance   Static Standing - Comment/# of Minutes  1-2 seconds Lt., 3 seconds Rt.       Standardized Balance Assessment   Standardized Balance Assessment  Timed Up and Go Test      Timed Up and Go Test   Normal TUG (seconds)  10.3             Objective measurements completed on examination: See above findings.      [place] Adult PT Treatment/Exercise - 12/23/16 0001      Knee/Hip Exercises: Stretches   Active Hamstring Stretch  3 reps;10 seconds    Active Hamstring Stretch Limitations  seated      Knee/Hip Exercises: Supine   Bridges  10 reps    Straight Leg Raises  10 reps;Both      Knee/Hip Exercises: Sidelying   Hip ABduction  10 reps    Hip ABduction Limitations  max for form      Knee/Hip Exercises: Prone   Hip Extension  10 reps;Both             PT Education - 12/23/16 1118    Education provided  Yes    Education Details  Educated on examination findings, focus for Plan of care to address findings, HEP     Person(s) Educated  Patient    Methods  Explanation;Demonstration;Handout    Comprehension  Verbalized understanding       PT Short Term Goals - 12/23/16 1201      PT SHORT TERM GOAL #1   Title  Patient will be independent with HEP to improve functional mobility and safety during ambulation    Time  2    Period  Weeks     Status  New      PT SHORT TERM GOAL #2   Title  Patient will present with at least 1/2 grade increase with MMT testing to improve strength for functional mobility in community with decreased fall risk.     Time  3    Period  Weeks    Status  New      PT SHORT TERM GOAL #3   Title  Patient will complete 3MWT ambulation of 530 feet to promote improved community ambulation safety.     Baseline  3    Period  Weeks    Status  New        PT Long Term Goals - 12/23/16 1202      PT LONG TERM GOAL #1   Title  Patient will present with at least 1 grade increase with MMT testing to improve strength for functional mobility in community with decreased fall risk.     Time  5    Period  Weeks    Status  New    Target Date  01/27/17      PT LONG TERM GOAL #2   Title  Patient will complete 3MWT ambulation of > 650 feet to promote improved community ambulation safety.    Time  5    Period  Weeks    Status  New      PT LONG TERM GOAL #3   Title  Patient will complete TUG functional mobility testing in < 9 seconds to indicate improve gait speed increase functional independence with mobility.     Time  5    Period  Weeks    Status  New      PT LONG TERM GOAL #4   Title  Patient will complete 5 time STS testing in < 13 seconds indicating improved functional LE strength and increased endurance without use of UE.     Time  5    Period  Weeks    Status  New      PT LONG TERM GOAL #5   Title  Pt to be able to SLS for 12 seconds or greater to reduce risk of falling     Time  5    Period  Weeks    Status  New             Plan - 12/23/16 1157    Clinical Impression Statement  Patient is a 51 year old male presenting to PT with history of neuropathy bilateral LEs and has functional weakness with presence of anterior trunk lean during ambulation. Patient has decreased in strength with MMT testing, decreased flexibility, decreased balance and decreased endurance with functional outcome  measure testing. Patient will benefit from skilled PT to address postural weakness and above mentioned impairments to improve ambulation safety and quality of daily living. Patient was encouraged to consider utilizing a walker for ambulation of longer distances, as with increased time he moves anteriorly even more during 3MWT. Patient asked about [employer] swimming voucher and PT is to follow up with him next session.     History and Personal Factors relevant to plan of care:  End stage renal failure, RA, overweight     Clinical Presentation  Stable    Clinical Decision Making  Low    Rehab Potential  Fair  Clinical Impairments Affecting Rehab Potential  PMHx, chronicity of symptoms     PT Frequency  2x / week    PT Duration  Other (comment) 5 weeks    PT Treatment/Interventions  ADLs/Self Care Home Management;Gait training;Stair training;Functional mobility training;Therapeutic exercise;Therapeutic activities;Manual techniques;Patient/family education;Neuromuscular re-education;Balance training;Energy conservation    PT Next Visit Plan  Review initial examination and HEP; focus on postural control during functional LE strength, endurance and balance; continue to follow up with pt in regards to walker for longer distances     PT Home Exercise Plan  Eval: sit to stand, seated hamstring stretch, supine SLR, bridge, prone hip extension     Recommended Other Services  None at this time.        Patient will benefit from skilled therapeutic intervention in order to improve the following deficits and impairments:  Abnormal gait, Decreased endurance, Cardiopulmonary status limiting activity, Decreased activity tolerance, Decreased balance, Decreased mobility, Difficulty walking, Decreased strength, Obesity, Pain, Improper body mechanics, Postural dysfunction, Impaired flexibility  Visit Diagnosis: Abnormality of gait due to impairment of balance - Plan: PT plan of care cert/re-cert  Muscle weakness  (generalized) - Plan: PT plan of care cert/re-cert  Impaired functional mobility, balance, and endurance - Plan: PT plan of care cert/re-cert  G-Codes - 67/67/20 1211    Functional Assessment Tool Used (Outpatient Only)  Based on TUG, 5xSTS, and 3MWT per therapist visual assessment of functional mobility.     Functional Limitation  Mobility: Walking and moving around    Mobility: Walking and Moving Around Current Status [phone])  At least 60 percent but less than 80 percent impaired, limited or restricted    Mobility: Walking and Moving Around Goal Status [phone])  At least 40 percent but less than 60 percent impaired, limited or restricted        Problem List Patient Active Problem List   Diagnosis Date Noted  . Onychomycosis 06/17/2016  . Constipation 03/05/2016  . Anemia in chronic kidney disease (CKD) 03/05/2016  . Bleeding from wound 03/03/2016  . Chronic anticoagulation 03/03/2016  . Scrotal abscess 02/25/2016  . Type 2 diabetes mellitus ([place]) 02/25/2016  . Acute venous embolism and thrombosis of deep vessels of proximal lower extremity ([place]) [I82.4Y9] 02/11/2016  . Encounter for therapeutic drug monitoring 02/11/2016  . Nausea & vomiting 11/17/2014  . Thrombocytopenia ([place]) 02/21/2014  . Morbid obesity ([place]) 03/30/2013  . RBBB 03/29/2013  . Hyperkalemia 03/28/2013  . Leukocytosis 03/28/2013  . Nausea vomiting and diarrhea 03/28/2013  . BBB (bundle branch block) 03/28/2013  . Hypertension   . ESRD on dialysis ([place])   . Lack of coordination 04/15/2012  . Muscle weakness (generalized) 04/15/2012  . Arthritis, gouty 04/15/2012  . Difficulty in walking(719.7) 08/18/2011  . Weakness of both legs 08/18/2011   Starr Lake PT, DPT 12:14 PM, 12/23/16 [place], [place], [address] Phone: [phone]   Fax:  [phone]  Name: Jack Irwin MRN: [id number] Date of Birth: 1965/06/04

## 2016-12-23 NOTE — Patient Instructions (Addendum)
Sit to Stand: Phase 1    Sitting, squeeze pelvic floor and hold. Lean trunk forward. Repeat 10 times. Do 2 times a day.  Copyright  VHI. All rights reserved. Flexion and Extension - Supine    Raise straight uninvolved leg toward ceiling 10 times. Repeat with other leg and hip. Do 2 times per day.  Copyright  VHI. All rights reserved.  Hip Extension (Prone)    Lift left leg 8 inches from floor, keeping knee locked. Repeat 10 times per set. Do 2 sets per session. Do 2 sessions per day.  http://orth.exer.us/98   Copyright  VHI. All rights reserved.    SEATED HAMSTRING STRETCH 3x30 seconds While seated, rest your heel on the floor with your knee straight and gently lean forward until a stretch is felt behind your knee/thigh.Bridge    Lie back, legs bent. Inhale, pressing hips up. Keeping ribs in, lengthen lower back. Exhale, rolling down along spine from top. Repeat 10 times. Do 2 sessions per day.  http://pm.exer.us/54   Copyright  VHI. All rights reserved.

## 2016-12-24 DIAGNOSIS — N186 End stage renal disease: Secondary | ICD-10-CM | POA: Diagnosis not present

## 2016-12-24 DIAGNOSIS — R112 Nausea with vomiting, unspecified: Secondary | ICD-10-CM | POA: Diagnosis not present

## 2016-12-24 DIAGNOSIS — N2581 Secondary hyperparathyroidism of renal origin: Secondary | ICD-10-CM | POA: Diagnosis not present

## 2016-12-24 DIAGNOSIS — Z992 Dependence on renal dialysis: Secondary | ICD-10-CM | POA: Diagnosis not present

## 2016-12-24 DIAGNOSIS — D509 Iron deficiency anemia, unspecified: Secondary | ICD-10-CM | POA: Diagnosis not present

## 2016-12-26 DIAGNOSIS — R112 Nausea with vomiting, unspecified: Secondary | ICD-10-CM | POA: Diagnosis not present

## 2016-12-26 DIAGNOSIS — D509 Iron deficiency anemia, unspecified: Secondary | ICD-10-CM | POA: Diagnosis not present

## 2016-12-26 DIAGNOSIS — N2581 Secondary hyperparathyroidism of renal origin: Secondary | ICD-10-CM | POA: Diagnosis not present

## 2016-12-26 DIAGNOSIS — N186 End stage renal disease: Secondary | ICD-10-CM | POA: Diagnosis not present

## 2016-12-26 DIAGNOSIS — Z992 Dependence on renal dialysis: Secondary | ICD-10-CM | POA: Diagnosis not present

## 2016-12-26 IMAGING — US US EXTREM LOW VENOUS*R*
1 series · 13 of 24 positions shown · non-contrast
Comparison: 01/21/2016

CLINICAL DATA: Right lower extremity increasing pain, history of
nonocclusive right popliteal DVT extending into the peroneal vein.



[Series 1: us extrem low venous*right* · 0.08mm/px · 13 of 67 slices shown]
[im 1/67]
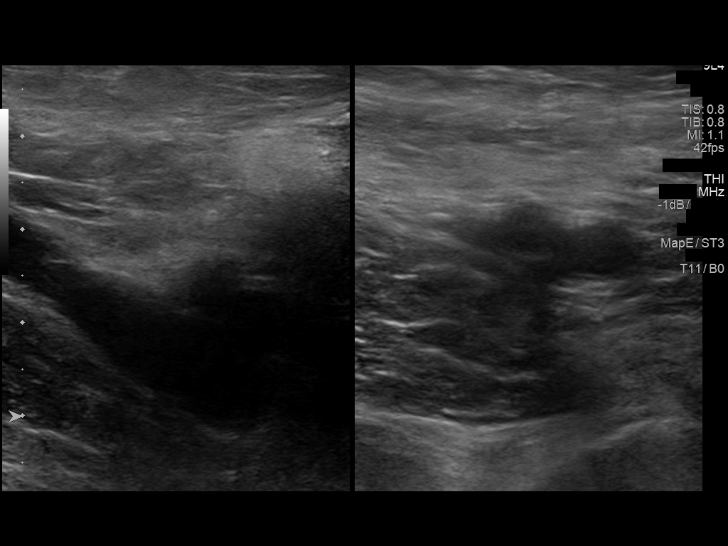
[im 6/67]
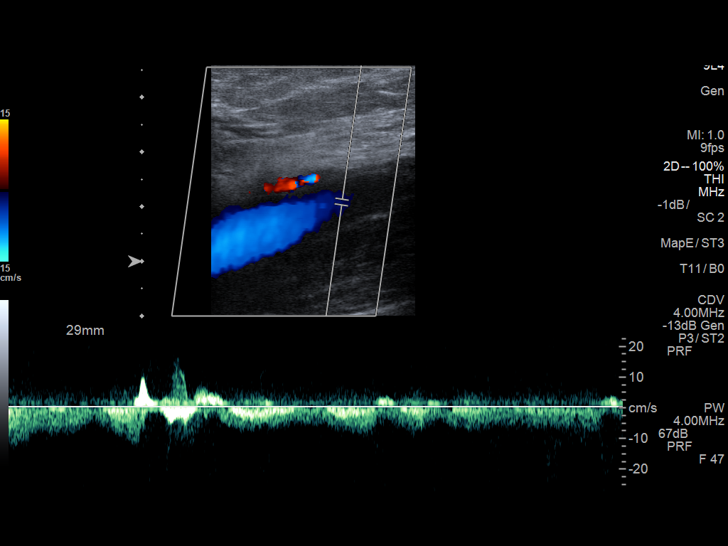
[im 12/67]
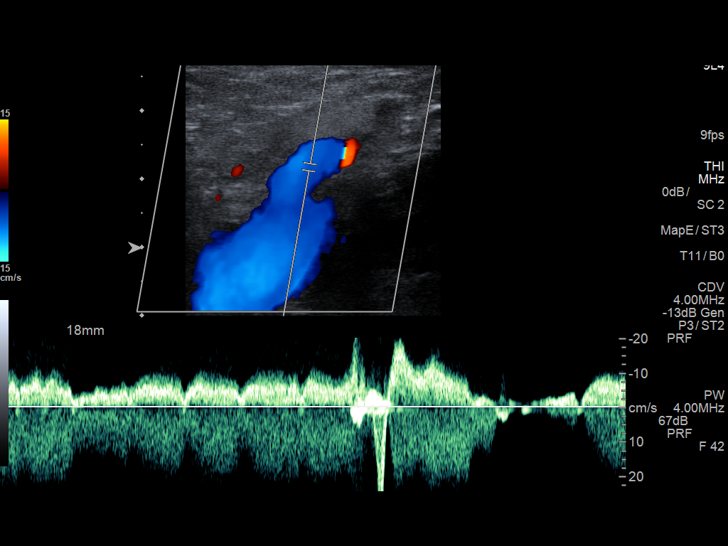
[im 18/67]
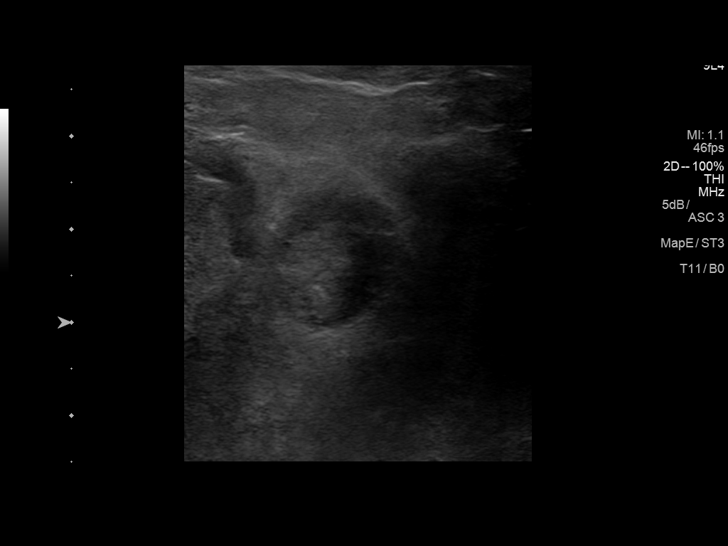
[im 23/67]
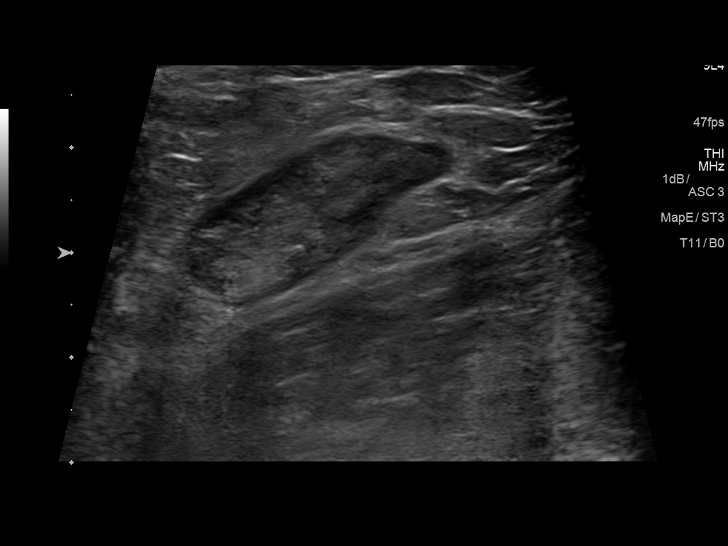
[im 29/67]
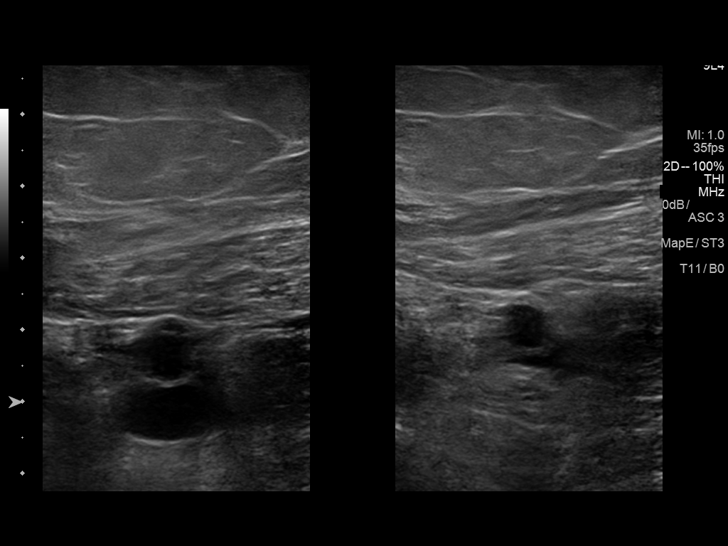
[im 35/67]
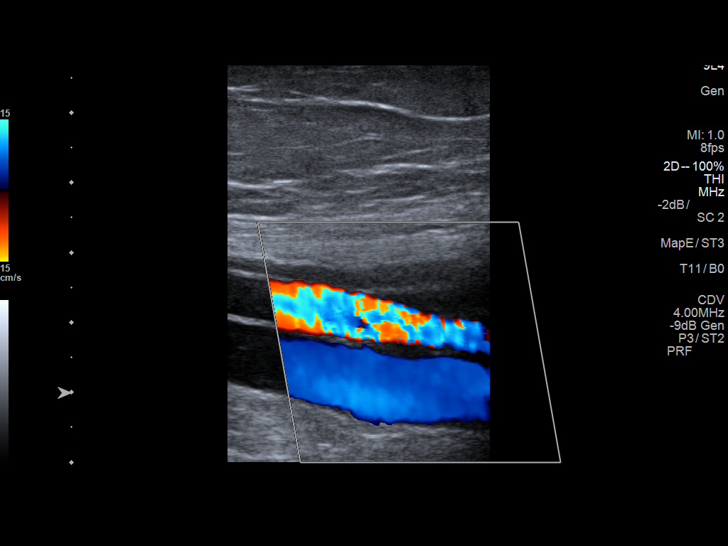
[im 38/67]
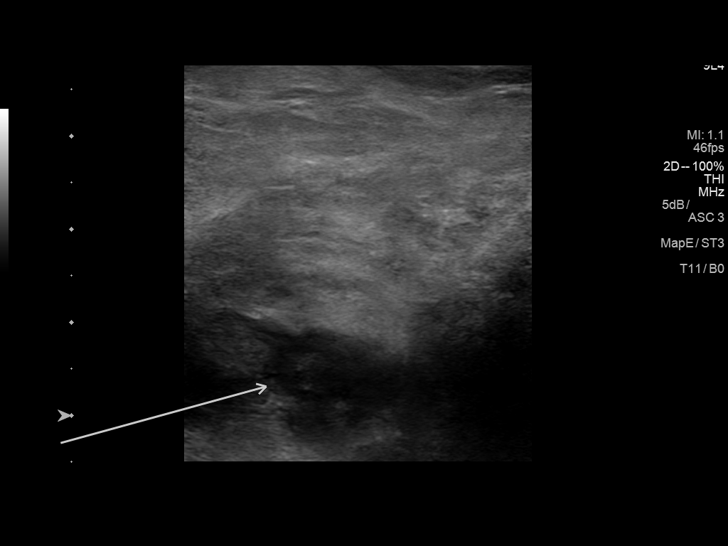
[im 44/67]
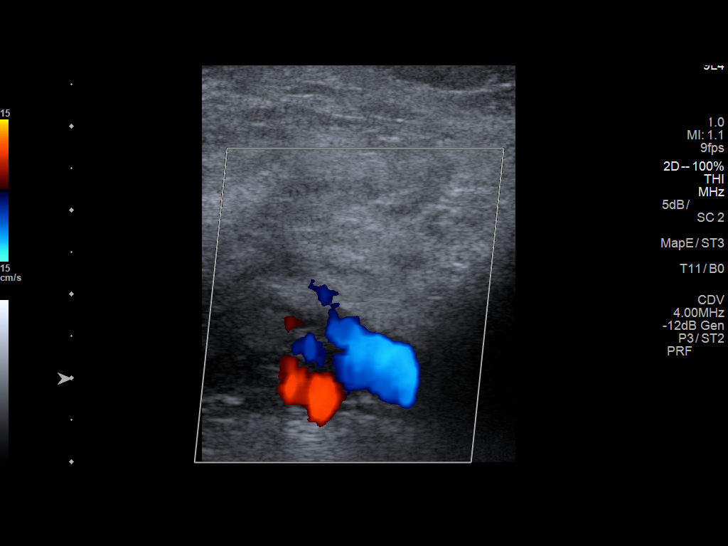
[im 49/67]
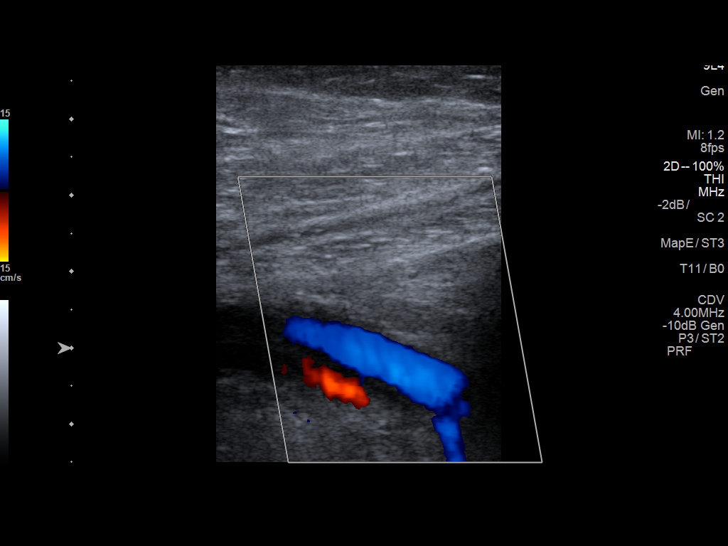
[im 55/67]
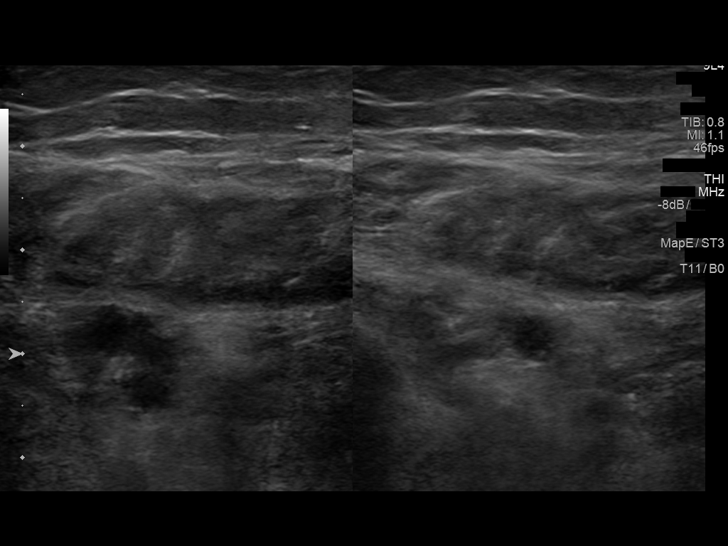
[im 61/67]
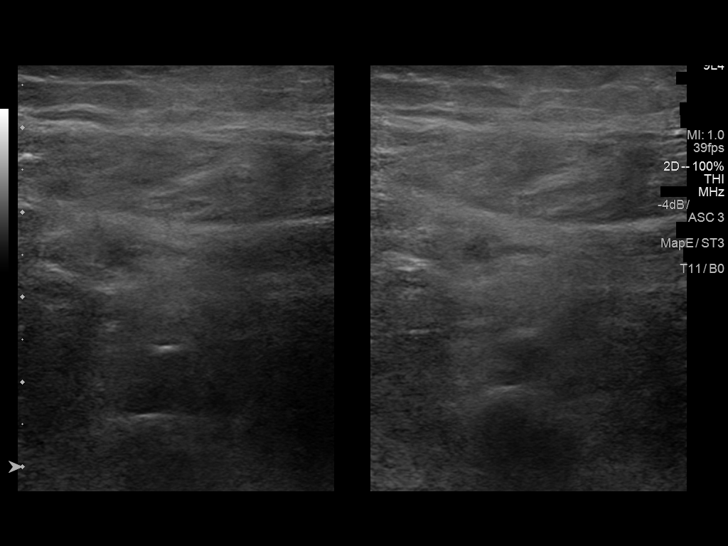
[im 67/67]
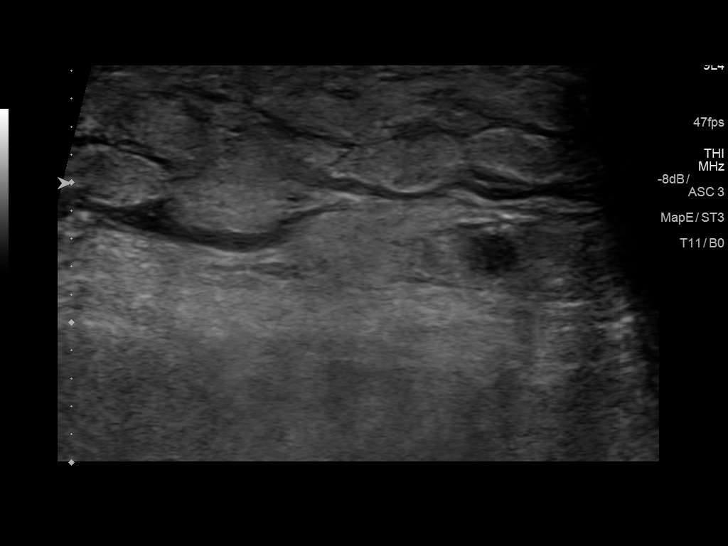

[13 of 24 positions shown; findings below may reference images not displayed]

FINDINGS: Contralateral Common Femoral Vein: Respiratory phasicity is normal
and symmetric with the symptomatic side. No evidence of thrombus.
Normal compressibility.

Common Femoral Vein: No evidence of thrombus. Normal
compressibility, respiratory phasicity and response to augmentation.

Saphenofemoral Junction: No evidence of thrombus. Normal
compressibility and flow on color Doppler imaging.

Profunda Femoral Vein: No evidence of thrombus. Normal
compressibility and flow on color Doppler imaging.

Femoral Vein: No evidence of thrombus. Normal compressibility,
respiratory phasicity and response to augmentation.

Popliteal Vein: Similar nonocclusive thrombus in the right popliteal
vein appearing partially compressible. No propagation into the
femoral vein. Phasic flow remains.

Calf Veins: Posterior tibial vein remains patent. Nonocclusive
thrombus suspected in the right peroneal vein.

Superficial Great Saphenous Vein: No evidence of thrombus. Normal
compressibility and flow on color Doppler imaging.

Venous Reflux:  None.

Other Findings: Subcutaneous calf edema evident. Fatty replaced
mildly prominent right inguinal lymph node measures 2.7 x 1.1 x
cm.
IMPRESSION: No significant change in nonocclusive popliteal DVT extending into
peroneal vein.

## 2016-12-29 DIAGNOSIS — N2581 Secondary hyperparathyroidism of renal origin: Secondary | ICD-10-CM | POA: Diagnosis not present

## 2016-12-29 DIAGNOSIS — R112 Nausea with vomiting, unspecified: Secondary | ICD-10-CM | POA: Diagnosis not present

## 2016-12-29 DIAGNOSIS — D509 Iron deficiency anemia, unspecified: Secondary | ICD-10-CM | POA: Diagnosis not present

## 2016-12-29 DIAGNOSIS — N186 End stage renal disease: Secondary | ICD-10-CM | POA: Diagnosis not present

## 2016-12-29 DIAGNOSIS — Z992 Dependence on renal dialysis: Secondary | ICD-10-CM | POA: Diagnosis not present

## 2016-12-30 ENCOUNTER — Telehealth (HOSPITAL_COMMUNITY): Payer: Self-pay | Admitting: Family Medicine

## 2016-12-30 ENCOUNTER — Emergency Department (HOSPITAL_COMMUNITY)
Admission: EM | Admit: 2016-12-30 | Discharge: 2016-12-30 | Disposition: A | Discharge status: Home / Self Care | Payer: Medicare Other | Attending: Emergency Medicine | Admitting: Emergency Medicine

## 2016-12-30 ENCOUNTER — Ambulatory Visit (HOSPITAL_COMMUNITY): Payer: Medicare Other | Admitting: Physical Therapy

## 2016-12-30 ENCOUNTER — Encounter (HOSPITAL_COMMUNITY): Payer: Self-pay | Admitting: *Deleted

## 2016-12-30 DIAGNOSIS — M79605 Pain in left leg: Secondary | ICD-10-CM | POA: Diagnosis not present

## 2016-12-30 DIAGNOSIS — Z86718 Personal history of other venous thrombosis and embolism: Secondary | ICD-10-CM | POA: Insufficient documentation

## 2016-12-30 DIAGNOSIS — E1122 Type 2 diabetes mellitus with diabetic chronic kidney disease: Secondary | ICD-10-CM | POA: Diagnosis not present

## 2016-12-30 DIAGNOSIS — M79604 Pain in right leg: Secondary | ICD-10-CM | POA: Diagnosis not present

## 2016-12-30 DIAGNOSIS — Z7901 Long term (current) use of anticoagulants: Secondary | ICD-10-CM | POA: Diagnosis not present

## 2016-12-30 DIAGNOSIS — I120 Hypertensive chronic kidney disease with stage 5 chronic kidney disease or end stage renal disease: Secondary | ICD-10-CM | POA: Diagnosis not present

## 2016-12-30 DIAGNOSIS — N186 End stage renal disease: Secondary | ICD-10-CM | POA: Diagnosis not present

## 2016-12-30 DIAGNOSIS — Z79899 Other long term (current) drug therapy: Secondary | ICD-10-CM | POA: Diagnosis not present

## 2016-12-30 DIAGNOSIS — Z992 Dependence on renal dialysis: Secondary | ICD-10-CM | POA: Diagnosis not present

## 2016-12-30 DIAGNOSIS — M79602 Pain in left arm: Secondary | ICD-10-CM | POA: Diagnosis not present

## 2016-12-30 LAB — BASIC METABOLIC PANEL
ANION GAP: 13 (ref 5–15)
BUN: 44 mg/dL — ABNORMAL HIGH (ref 6–20)
CALCIUM: 9.8 mg/dL (ref 8.9–10.3)
CHLORIDE: 99 mmol/L — AB (ref 101–111)
CO2: 28 mmol/L (ref 22–32)
Creatinine, Ser: 14.37 mg/dL — ABNORMAL HIGH (ref 0.61–1.24)
GFR calc non Af Amer: 3 mL/min — ABNORMAL LOW (ref >60)
GFR, EST AFRICAN AMERICAN: 4 mL/min — AB (ref >60)
Glucose, Bld: 84 mg/dL (ref 65–99)
Potassium: 4.8 mmol/L (ref 3.5–5.1)
Sodium: 140 mmol/L (ref 135–145)

## 2016-12-30 LAB — CBC
HEMATOCRIT: 45.4 % (ref 39.0–52.0)
HEMOGLOBIN: 14.2 g/dL (ref 13.0–17.0)
MCH: 29.5 pg (ref 26.0–34.0)
MCHC: 31.3 g/dL (ref 30.0–36.0)
MCV: 94.2 fL (ref 78.0–100.0)
Platelets: 114 10*3/uL — ABNORMAL LOW (ref 150–400)
RBC: 4.82 MIL/uL (ref 4.22–5.81)
RDW: 17.3 % — AB (ref 11.5–15.5)
WBC: 9.2 10*3/uL (ref 4.0–10.5)

## 2016-12-30 LAB — CBG MONITORING, ED: GLUCOSE-CAPILLARY: 81 mg/dL (ref 65–99)

## 2016-12-30 NOTE — Discharge Instructions (Signed)
Follow-up tomorrow to have your ultrasound study.  Continue your current medications

## 2016-12-30 NOTE — ED Provider Notes (Signed)
[facility] EMERGENCY DEPARTMENT Provider Note   CSN: [id number] Arrival date & time: 12/30/16  1221     History   Chief Complaint Chief Complaint  Patient presents with  . Leg Pain    HPI Jack Irwin is a 51 y.o. male.  HPI To the emergency room for evaluation of bilateral lower extremity pain.  Patient states he has a history of DVT.  He currently is taking Eliquis.  Patient states that he started having cramping sensation in both of his calves.  He has not noticed any swelling.  No trouble with any fevers chest pain or shortness of breath.  He spoke to his doctor today who suggested he come to the emergency room for evaluation. Past Medical History:  Diagnosis Date  . Anemia   . Arthritis   . BPH (benign prostatic hyperplasia)   . Chronic back pain   . Difficulty walking   . DVT (deep venous thrombosis) ([place])    Right popliteal DVT December 2017  . End-stage renal disease on hemodialysis ([place])    Dr. Lowanda Foster  . Erectile dysfunction   . Essential hypertension   . Gout   . Neuropathy, diabetic ([place])   . Type 2 diabetes mellitus ([place])   . Venous (peripheral) insufficiency     Patient Active Problem List   Diagnosis Date Noted  . Onychomycosis 06/17/2016  . Constipation 03/05/2016  . Anemia in chronic kidney disease (CKD) 03/05/2016  . Bleeding from wound 03/03/2016  . Chronic anticoagulation 03/03/2016  . Scrotal abscess 02/25/2016  . Type 2 diabetes mellitus ([place]) 02/25/2016  . Acute venous embolism and thrombosis of deep vessels of proximal lower extremity ([place]) [I82.4Y9] 02/11/2016  . Encounter for therapeutic drug monitoring 02/11/2016  . Nausea & vomiting 11/17/2014  . Thrombocytopenia ([place]) 02/21/2014  . Morbid obesity ([place]) 03/30/2013  . RBBB 03/29/2013  . Hyperkalemia 03/28/2013  . Leukocytosis 03/28/2013  . Nausea vomiting and diarrhea 03/28/2013  . BBB (bundle branch block) 03/28/2013  . Hypertension   . ESRD on dialysis ([place])   . Lack  of coordination 04/15/2012  . Muscle weakness (generalized) 04/15/2012  . Arthritis, gouty 04/15/2012  . Difficulty in walking(719.7) 08/18/2011  . Weakness of both legs 08/18/2011    Past Surgical History:  Procedure Laterality Date  . AV FISTULA PLACEMENT Left 03/09/2014   Procedure: INSERTION OF ARTERIOVENOUS (AV) GORE-TEX GRAFT ARM;  Surgeon: Angelia Mould, MD;  Location: [facility] OR;  Service: Vascular;  Laterality: Left;  . AV FISTULA PLACEMENT Right 05/06/2016   Procedure: INSERTION OF ARTERIOVENOUS (AV) GORE-TEX GRAFT  Right ARM;  Surgeon: Elam Dutch, MD;  Location: [facility];  Service: Vascular;  Laterality: Right;  . CYST EXCISION Right    cyst removed on thumb 2001  . INSERTION OF DIALYSIS CATHETER    . INSERTION OF DIALYSIS CATHETER Left 03/09/2014   Procedure: INSERTION OF DIALYSIS CATHETER;  Surgeon: Angelia Mould, MD;  Location: [facility];  Service: Vascular;  Laterality: Left;  . IR THROMBECTOMY AV FISTULA W/THROMBOLYSIS/PTA INC/SHUNT/IMG RIGHT Right 11/01/2016  . IR US GUIDE VASC ACCESS RIGHT  11/01/2016  . IRRIGATION AND DEBRIDEMENT ABSCESS N/A 02/25/2016   Procedure: IRRIGATION AND DEBRIDEMENT SCROTAL ABSCESS;  Surgeon: Nickie Retort, MD;  [facility];  Service: Urology;  Laterality: N/A;       Home Medications    Prior to Admission medications   Medication Sig Start Date End Date Taking? Authorizing Provider  apixaban (ELIQUIS) 2.5 MG TABS tablet Take  1 tablet (2.5 mg total) 2 (two) times daily by mouth. 12/16/16   Dorena Dew, FNP  betamethasone dipropionate (DIPROLENE) 0.05 % cream Apply topically 2 (two) times daily. 06/17/16   Dorena Dew, FNP  colchicine 0.6 MG tablet Take 0.5 tablets (0.3 mg total) by mouth daily as needed (for gout). 05/06/16   Alvia Grove, PA-C  diclofenac sodium (VOLTAREN) 1 % GEL Apply 4 g topically 4 (four) times daily. 06/17/16   Dorena Dew, FNP  gabapentin (NEURONTIN) 100 MG capsule Take 1 capsule (100  mg total) by mouth daily. 10/13/16   Dorena Dew, FNP  Multiple Vitamin (DAILY VITE PO) Take 1 tablet by mouth daily.    [provider]  nystatin (NYSTATIN) powder Apply topically 2 (two) times daily. Patient not taking: Reported on 09/01/2016 08/28/16   Dorena Dew, FNP  sevelamer carbonate (RENVELA) 800 MG tablet Take 1 tablet (800 mg total) by mouth 2 (two) times daily between meals as needed (With snacks). Patient taking differently: Take 800-3,200 mg by mouth 5 (five) times daily. Take 3200mg  three times a day with meals and take 800mg  twice a day with snacks 02/29/16   Cristal Ford, DO  sildenafil (REVATIO) 20 MG tablet Take 1 tablet (20 mg total) by mouth daily as needed. Dissolved under tongue (TROCHE) 60 minutes prior to sexual encounter 11/06/16   Dorena Dew, FNP  traMADol (ULTRAM) 50 MG tablet Take 1 tablet (50 mg total) by mouth every 6 (six) hours as needed. Patient not taking: Reported on 11/01/2016 08/27/16   Milton Ferguson, MD  triamcinolone cream (KENALOG) 0.1 % Apply 1 application topically 2 (two) times daily as needed.     [provider]  ULORIC 40 MG tablet Take 1 tablet (40 mg total) daily by mouth. 12/22/16   Dorena Dew, FNP    Family History Family History  Problem Relation Age of Onset  . Diabetes Mother   . Hypertension Mother   . Heart failure Mother   . Hyperlipidemia Mother   . Heart attack Mother   . Cancer Father   . Diabetes Father   . Hypertension Father   . Hyperlipidemia Father     Social History Social History   Tobacco Use  . Smoking status: Never Smoker  . Smokeless tobacco: Never Used  Substance Use Topics  . Alcohol use: No    Alcohol/week: 0.0 oz  . Drug use: No     Allergies   Doxycycline   Review of Systems Review of Systems  All other systems reviewed and are negative.    Physical Exam Updated Vital Signs BP 105/77 (BP Location: Left Arm)   Pulse 81   Temp 97.7 F (36.5 C)  (Oral)   Resp 18   Ht 1.803 m (5\' 11" )   Wt 118.8 kg (262 lb)   SpO2 100%   BMI 36.54 kg/m   Physical Exam  Constitutional: He appears well-developed and well-nourished. No distress.  HENT:  Head: Normocephalic and atraumatic.  Right Ear: External ear normal.  Left Ear: External ear normal.  Eyes: Conjunctivae are normal. Right eye exhibits no discharge. Left eye exhibits no discharge. No scleral icterus.  Neck: Neck supple. No tracheal deviation present.  Cardiovascular: Normal rate, regular rhythm and intact distal pulses.  Pulmonary/Chest: Effort normal and breath sounds normal. No stridor. No respiratory distress. He has no wheezes. He has no rales.  Abdominal: Soft. Bowel sounds are normal. He exhibits no distension. There  is no tenderness. There is no rebound and no guarding.  Musculoskeletal: He exhibits no edema or tenderness.  Neurological: He is alert. He has normal strength. No cranial nerve deficit (no facial droop, extraocular movements intact, no slurred speech) or sensory deficit. He exhibits normal muscle tone. He displays no seizure activity. Coordination normal.  Skin: Skin is warm and dry. No rash noted.  Psychiatric: He has a normal mood and affect.  Nursing note and vitals reviewed.    ED Treatments / Results  Labs (all labs ordered are listed, but only abnormal results are displayed) Labs Reviewed  CBC - Abnormal; Notable for the following components:      Result Value   RDW 17.3 (*)    Platelets 114 (*)    All other components within normal limits  BASIC METABOLIC PANEL - Abnormal; Notable for the following components:   Chloride 99 (*)    BUN 44 (*)    Creatinine, Ser 14.37 (*)    GFR calc non Af Amer 3 (*)    GFR calc Af Amer 4 (*)    All other components within normal limits  CBG MONITORING, ED     Radiology No results found.  Procedures Procedures (including critical care time)  Medications Ordered in ED Medications - No data to  display   Initial Impression / Assessment and Plan / ED Course  I have reviewed the triage vital signs and the nursing notes.  Pertinent labs & imaging results that were available during my care of the patient were reviewed by me and considered in my medical decision making (see chart for details).  Clinical Course as of Dec 31 1999  Tue Dec 30, 2016  1818 Presents to the emergency room for evaluation of calf pain.  On exam he does not have any significant erythema, or edema.  He is currently on anticoagulation.  Ultrasound is not available at this time of the evening.  I will check some basic labs and arrange for him to have ultrasound in the morning  [JK]  1957 Laboratory tests are not significantly changed from his baseline.  Patient is not having any trouble with chest pain or shortness of breath.  No signs of infection.  I will have the patient return in the morning for a Doppler study.  [JK]    Clinical Course User Index [JK] [name], MD    Patient presents with bilateral leg pain.  He is concerned about the possibility of recurrent DVT.  His physical exam is reassuring.  Laboratory tests are unremarkable.  I will have him return tomorrow to have a Doppler study as unfortunately that is not available at this time  Final Clinical Impressions(s) / ED Diagnoses   Final diagnoses:  Pain in both lower extremities    ED Discharge Orders        Ordered    US Venous Img Lower Bilateral     12/30/16 1959       [name], MD 12/30/16 2001

## 2016-12-30 NOTE — Telephone Encounter (Signed)
12/30/16  pt left a message that he was cancelling because he was in the ER

## 2016-12-30 NOTE — ED Triage Notes (Signed)
Pt with bilateral leg pain 2 days ago, concerned about DVT's in them and here for evaluation.  Pt with hx of DVT.

## 2016-12-31 ENCOUNTER — Ambulatory Visit: Payer: Self-pay | Admitting: Family Medicine

## 2016-12-31 ENCOUNTER — Ambulatory Visit (HOSPITAL_COMMUNITY)
Admission: RE | Admit: 2016-12-31 | Discharge: 2016-12-31 | Disposition: A | Discharge status: Home / Self Care | Payer: Medicare Other | Source: Ambulatory Visit | Attending: Emergency Medicine | Admitting: Emergency Medicine

## 2016-12-31 ENCOUNTER — Ambulatory Visit (HOSPITAL_COMMUNITY): Payer: Medicare Other

## 2016-12-31 DIAGNOSIS — M79605 Pain in left leg: Secondary | ICD-10-CM | POA: Diagnosis not present

## 2016-12-31 DIAGNOSIS — M7989 Other specified soft tissue disorders: Secondary | ICD-10-CM | POA: Insufficient documentation

## 2016-12-31 DIAGNOSIS — M79604 Pain in right leg: Secondary | ICD-10-CM | POA: Diagnosis not present

## 2016-12-31 NOTE — ED Provider Notes (Signed)
Outpatient ultrasounds of bilateral lower extremities showed no evidence of DVT.  Patient is comfortable.  Denies complaint.  I have advised him to follow-up with his primary care physician or return to the emergency department if concern for any reason.   Orlie Dakin, MD 12/31/16 1414

## 2017-01-01 ENCOUNTER — Telehealth (HOSPITAL_COMMUNITY): Payer: Self-pay | Admitting: Family Medicine

## 2017-01-01 ENCOUNTER — Ambulatory Visit (HOSPITAL_COMMUNITY): Payer: Medicare Other | Admitting: Physical Therapy

## 2017-01-01 NOTE — Telephone Encounter (Signed)
01/01/17  he came in and said to cx he had to go for more testing

## 2017-01-02 DIAGNOSIS — N2581 Secondary hyperparathyroidism of renal origin: Secondary | ICD-10-CM | POA: Diagnosis not present

## 2017-01-02 DIAGNOSIS — N186 End stage renal disease: Secondary | ICD-10-CM | POA: Diagnosis not present

## 2017-01-02 DIAGNOSIS — Z992 Dependence on renal dialysis: Secondary | ICD-10-CM | POA: Diagnosis not present

## 2017-01-02 DIAGNOSIS — R112 Nausea with vomiting, unspecified: Secondary | ICD-10-CM | POA: Diagnosis not present

## 2017-01-02 DIAGNOSIS — D509 Iron deficiency anemia, unspecified: Secondary | ICD-10-CM | POA: Diagnosis not present

## 2017-01-05 DIAGNOSIS — N2581 Secondary hyperparathyroidism of renal origin: Secondary | ICD-10-CM | POA: Diagnosis not present

## 2017-01-05 DIAGNOSIS — Z992 Dependence on renal dialysis: Secondary | ICD-10-CM | POA: Diagnosis not present

## 2017-01-05 DIAGNOSIS — N186 End stage renal disease: Secondary | ICD-10-CM | POA: Diagnosis not present

## 2017-01-05 DIAGNOSIS — D509 Iron deficiency anemia, unspecified: Secondary | ICD-10-CM | POA: Diagnosis not present

## 2017-01-06 ENCOUNTER — Ambulatory Visit (HOSPITAL_COMMUNITY): Payer: Medicare Other

## 2017-01-06 ENCOUNTER — Telehealth (HOSPITAL_COMMUNITY): Payer: Self-pay | Admitting: Family Medicine

## 2017-01-06 NOTE — Telephone Encounter (Signed)
01/06/17  Pt left a message to cx today's appt.

## 2017-01-07 ENCOUNTER — Ambulatory Visit: Payer: Self-pay | Admitting: Family Medicine

## 2017-01-07 DIAGNOSIS — D509 Iron deficiency anemia, unspecified: Secondary | ICD-10-CM | POA: Diagnosis not present

## 2017-01-07 DIAGNOSIS — N186 End stage renal disease: Secondary | ICD-10-CM | POA: Diagnosis not present

## 2017-01-07 DIAGNOSIS — N2581 Secondary hyperparathyroidism of renal origin: Secondary | ICD-10-CM | POA: Diagnosis not present

## 2017-01-07 DIAGNOSIS — Z992 Dependence on renal dialysis: Secondary | ICD-10-CM | POA: Diagnosis not present

## 2017-01-08 ENCOUNTER — Ambulatory Visit (HOSPITAL_COMMUNITY): Payer: Medicare Other | Attending: Family Medicine | Admitting: Physical Therapy

## 2017-01-08 DIAGNOSIS — M6281 Muscle weakness (generalized): Secondary | ICD-10-CM | POA: Diagnosis not present

## 2017-01-08 DIAGNOSIS — Z7409 Other reduced mobility: Secondary | ICD-10-CM | POA: Diagnosis not present

## 2017-01-08 DIAGNOSIS — R2689 Other abnormalities of gait and mobility: Secondary | ICD-10-CM | POA: Diagnosis not present

## 2017-01-08 NOTE — Therapy (Signed)
[place], [place], [address] Phone: [phone]   Fax:  [phone]  Physical Therapy Treatment  Patient Details  Name: Jack Irwin MRN: [id number] Date of Birth: 1965-12-14 Referring Provider: Dorena Dew FNP   Encounter Date: 01/08/2017  PT End of Session - 01/08/17 1542    Visit Number  2    Number of Visits  11    Date for PT Re-Evaluation  01/20/17    Authorization Type  Medicare Part A     Authorization Time Period  12/23/2016-12/31/2016    Authorization - Visit Number  2    Authorization - Number of Visits  10    PT Start Time  1510    PT Stop Time  1540    PT Time Calculation (min)  30 min    Activity Tolerance  Patient tolerated treatment well    Behavior During Therapy  [facility] for tasks assessed/performed       Past Medical History:  Diagnosis Date  . Anemia   . Arthritis   . BPH (benign prostatic hyperplasia)   . Chronic back pain   . Difficulty walking   . DVT (deep venous thrombosis) ([place])    Right popliteal DVT December 2017  . End-stage renal disease on hemodialysis ([place])    Dr. Lowanda Foster  . Erectile dysfunction   . Essential hypertension   . Gout   . Neuropathy, diabetic ([place])   . Type 2 diabetes mellitus ([place])   . Venous (peripheral) insufficiency     Past Surgical History:  Procedure Laterality Date  . AV FISTULA PLACEMENT Left 03/09/2014   Procedure: INSERTION OF ARTERIOVENOUS (AV) GORE-TEX GRAFT ARM;  Surgeon: Angelia Mould, MD;  Location: [facility] OR;  Service: Vascular;  Laterality: Left;  . AV FISTULA PLACEMENT Right 05/06/2016   Procedure: INSERTION OF ARTERIOVENOUS (AV) GORE-TEX GRAFT  Right ARM;  Surgeon: Elam Dutch, MD;  Location: [facility];  Service: Vascular;  Laterality: Right;  . CYST EXCISION Right    cyst removed on thumb 2001  . INSERTION OF DIALYSIS CATHETER    . INSERTION OF DIALYSIS CATHETER Left 03/09/2014   Procedure: INSERTION OF DIALYSIS  CATHETER;  Surgeon: Angelia Mould, MD;  Location: [facility];  Service: Vascular;  Laterality: Left;  . IR THROMBECTOMY AV FISTULA W/THROMBOLYSIS/PTA INC/SHUNT/IMG RIGHT Right 11/01/2016  . IR US GUIDE VASC ACCESS RIGHT  11/01/2016  . IRRIGATION AND DEBRIDEMENT ABSCESS N/A 02/25/2016   Procedure: IRRIGATION AND DEBRIDEMENT SCROTAL ABSCESS;  Surgeon: Nickie Retort, MD;  Location: [facility];  Service: Urology;  Laterality: N/A;    There were no vitals filed for this visit.  Subjective Assessment - 01/08/17 1525    Subjective  PT states he always has pain in his leg, thighs and feet.  Burning stinging pain.  STates he has been busy and has not been here due to thinking he had blood clots in his LE's.  States he has been to the ED and several MD appointments for this.  PT also reports he can only stay here for 30 minutes today and has to be somewhere.     Currently in Pain?  Yes    Pain Score  3     Pain Location  Leg    Pain Orientation  Left;Right    Pain Descriptors / Indicators  Aching  [facility] Adult PT Treatment/Exercise - 01/08/17 0001      Knee/Hip Exercises: Stretches   Other Knee/Hip Stretches  UE flexion/wall slides 10 reps      Knee/Hip Exercises: Seated   Sit to Sand  5 reps;with UE support in 9.4 seconds      Knee/Hip Exercises: Supine   Bridges  2 sets;10 reps    Straight Leg Raises  10 reps;Both      Knee/Hip Exercises: Sidelying   Hip ABduction  10 reps    Hip ABduction Limitations  max for form      Knee/Hip Exercises: Prone   Hip Extension  10 reps;Both             PT Education - 01/08/17 1541    Education provided  Yes    Education Details  reviewed HEP and goals per intial evaluation.  Discussed importance of being compliant with sessions and walking for exercise.  Given 2 week [employer] sheet to encourage membership/participation    Person(s) Educated  Patient    Methods  Explanation;Demonstration;Tactile cues;Verbal  cues;Handout    Comprehension  Verbalized understanding       PT Short Term Goals - 12/23/16 1201      PT SHORT TERM GOAL #1   Title  Patient will be independent with HEP to improve functional mobility and safety during ambulation    Time  2    Period  Weeks    Status  New      PT SHORT TERM GOAL #2   Title  Patient will present with at least 1/2 grade increase with MMT testing to improve strength for functional mobility in community with decreased fall risk.     Time  3    Period  Weeks    Status  New      PT SHORT TERM GOAL #3   Title  Patient will complete 3MWT ambulation of 530 feet to promote improved community ambulation safety.     Baseline  3    Period  Weeks    Status  New        PT Long Term Goals - 12/23/16 1202      PT LONG TERM GOAL #1   Title  Patient will present with at least 1 grade increase with MMT testing to improve strength for functional mobility in community with decreased fall risk.     Time  5    Period  Weeks    Status  New    Target Date  01/27/17      PT LONG TERM GOAL #2   Title  Patient will complete 3MWT ambulation of > 650 feet to promote improved community ambulation safety.    Time  5    Period  Weeks    Status  New      PT LONG TERM GOAL #3   Title  Patient will complete TUG functional mobility testing in < 9 seconds to indicate improve gait speed increase functional independence with mobility.     Time  5    Period  Weeks    Status  New      PT LONG TERM GOAL #4   Title  Patient will complete 5 time STS testing in < 13 seconds indicating improved functional LE strength and increased endurance without use of UE.     Time  5    Period  Weeks    Status  New      PT LONG TERM GOAL #5  Title  Pt to be able to SLS for 12 seconds or greater to reduce risk of falling     Time  5    Period  Weeks    Status  New            Plan - 01/08/17 1543    Clinical Impression Statement  Reviewed HEP and goals per intial evaluation.   Discussed importance of attending therapy regularly and staying for entirety of session for maximum benefits.  Also encouraged to complete his exercises more and walk daily.  Pt verbalized understanding.  PT requested 2 week trial membership form for [employer] that was issued to him.  Added postural UE flexion against door and sit to stand activity this session.  Unable to complete full session as patient requested to leave after 30 minutes (no reason given).  Cues given to walk with more upright posturing.     Rehab Potential  Fair    Clinical Impairments Affecting Rehab Potential  PMHx, chronicity of symptoms     PT Frequency  2x / week    PT Duration  Other (comment) 5 weeks    PT Treatment/Interventions  ADLs/Self Care Home Management;Gait training;Stair training;Functional mobility training;Therapeutic exercise;Therapeutic activities;Manual techniques;Patient/family education;Neuromuscular re-education;Balance training;Energy conservation    PT Next Visit Plan  Conitnue with focus on postural control during functional LE strength, endurance and balance.  Encouarge participation    PT Home Exercise Plan  Eval: sit to stand, seated hamstring stretch, supine SLR, bridge, prone hip extension        Patient will benefit from skilled therapeutic intervention in order to improve the following deficits and impairments:  Abnormal gait, Decreased endurance, Cardiopulmonary status limiting activity, Decreased activity tolerance, Decreased balance, Decreased mobility, Difficulty walking, Decreased strength, Obesity, Pain, Improper body mechanics, Postural dysfunction, Impaired flexibility  Visit Diagnosis: Abnormality of gait due to impairment of balance  Muscle weakness (generalized)  Impaired functional mobility, balance, and endurance     Problem List Patient Active Problem List   Diagnosis Date Noted  . Onychomycosis 06/17/2016  . Constipation 03/05/2016  . Anemia in chronic kidney disease (CKD)  03/05/2016  . Bleeding from wound 03/03/2016  . Chronic anticoagulation 03/03/2016  . Scrotal abscess 02/25/2016  . Type 2 diabetes mellitus ([place]) 02/25/2016  . Acute venous embolism and thrombosis of deep vessels of proximal lower extremity ([place]) [I82.4Y9] 02/11/2016  . Encounter for therapeutic drug monitoring 02/11/2016  . Nausea & vomiting 11/17/2014  . Thrombocytopenia ([place]) 02/21/2014  . Morbid obesity ([place]) 03/30/2013  . RBBB 03/29/2013  . Hyperkalemia 03/28/2013  . Leukocytosis 03/28/2013  . Nausea vomiting and diarrhea 03/28/2013  . BBB (bundle branch block) 03/28/2013  . Hypertension   . ESRD on dialysis ([place])   . Lack of coordination 04/15/2012  . Muscle weakness (generalized) 04/15/2012  . Arthritis, gouty 04/15/2012  . Difficulty in walking(719.7) 08/18/2011  . Weakness of both legs 08/18/2011   Teena Irani, PTA/CLT [phone]  Teena Irani 01/08/2017, 3:54 PM  [facility] [address] [place], [place], [address] Phone: [phone]   Fax:  [phone]  Name: Jack Irwin MRN: [id number] Date of Birth: 1965/05/31

## 2017-01-09 DIAGNOSIS — N2581 Secondary hyperparathyroidism of renal origin: Secondary | ICD-10-CM | POA: Diagnosis not present

## 2017-01-09 DIAGNOSIS — N186 End stage renal disease: Secondary | ICD-10-CM | POA: Diagnosis not present

## 2017-01-09 DIAGNOSIS — D509 Iron deficiency anemia, unspecified: Secondary | ICD-10-CM | POA: Diagnosis not present

## 2017-01-09 DIAGNOSIS — Z992 Dependence on renal dialysis: Secondary | ICD-10-CM | POA: Diagnosis not present

## 2017-01-13 ENCOUNTER — Ambulatory Visit (HOSPITAL_COMMUNITY): Payer: Medicare Other | Admitting: Physical Therapy

## 2017-01-13 ENCOUNTER — Telehealth (HOSPITAL_COMMUNITY): Payer: Self-pay | Admitting: Physical Therapy

## 2017-01-13 DIAGNOSIS — Z992 Dependence on renal dialysis: Secondary | ICD-10-CM | POA: Diagnosis not present

## 2017-01-13 DIAGNOSIS — D509 Iron deficiency anemia, unspecified: Secondary | ICD-10-CM | POA: Diagnosis not present

## 2017-01-13 DIAGNOSIS — N186 End stage renal disease: Secondary | ICD-10-CM | POA: Diagnosis not present

## 2017-01-13 DIAGNOSIS — N2581 Secondary hyperparathyroidism of renal origin: Secondary | ICD-10-CM | POA: Diagnosis not present

## 2017-01-13 NOTE — Telephone Encounter (Signed)
Pt did not show for appointment (NS #1) unable to reach or leave message Teena Irani, PTA/CLT [phone]

## 2017-01-14 DIAGNOSIS — Z992 Dependence on renal dialysis: Secondary | ICD-10-CM | POA: Diagnosis not present

## 2017-01-14 DIAGNOSIS — D509 Iron deficiency anemia, unspecified: Secondary | ICD-10-CM | POA: Diagnosis not present

## 2017-01-14 DIAGNOSIS — N2581 Secondary hyperparathyroidism of renal origin: Secondary | ICD-10-CM | POA: Diagnosis not present

## 2017-01-14 DIAGNOSIS — N186 End stage renal disease: Secondary | ICD-10-CM | POA: Diagnosis not present

## 2017-01-15 ENCOUNTER — Ambulatory Visit (HOSPITAL_COMMUNITY): Payer: Medicare Other

## 2017-01-15 ENCOUNTER — Encounter (HOSPITAL_COMMUNITY): Payer: Self-pay

## 2017-01-15 DIAGNOSIS — M6281 Muscle weakness (generalized): Secondary | ICD-10-CM

## 2017-01-15 DIAGNOSIS — Z7409 Other reduced mobility: Secondary | ICD-10-CM

## 2017-01-15 DIAGNOSIS — D3101 Benign neoplasm of right conjunctiva: Secondary | ICD-10-CM | POA: Diagnosis not present

## 2017-01-15 DIAGNOSIS — R2689 Other abnormalities of gait and mobility: Secondary | ICD-10-CM | POA: Diagnosis not present

## 2017-01-15 NOTE — Therapy (Signed)
[place], [place], [address] Phone: [phone]   Fax:  [phone]  Physical Therapy Treatment  Patient Details  Name: Jack Irwin MRN: [id number] Date of Birth: 01-May-1965 Referring Provider: Dorena Dew FNP   Encounter Date: 01/15/2017  PT End of Session - 01/15/17 1018    Visit Number  3    Number of Visits  11    Date for PT Re-Evaluation  01/20/17    Authorization Type  Medicare Part A     Authorization Time Period  12/23/2016-01/30/2017    Authorization - Visit Number  3    Authorization - Number of Visits  10    PT Start Time  0946    PT Stop Time  1030    PT Time Calculation (min)  44 min    Activity Tolerance  Patient tolerated treatment well    Behavior During Therapy  [facility] for tasks assessed/performed       Past Medical History:  Diagnosis Date  . Anemia   . Arthritis   . BPH (benign prostatic hyperplasia)   . Chronic back pain   . Difficulty walking   . DVT (deep venous thrombosis) ([place])    Right popliteal DVT December 2017  . End-stage renal disease on hemodialysis ([place])    Dr. Lowanda Foster  . Erectile dysfunction   . Essential hypertension   . Gout   . Neuropathy, diabetic ([place])   . Type 2 diabetes mellitus ([place])   . Venous (peripheral) insufficiency     Past Surgical History:  Procedure Laterality Date  . AV FISTULA PLACEMENT Left 03/09/2014   Procedure: INSERTION OF ARTERIOVENOUS (AV) GORE-TEX GRAFT ARM;  Surgeon: Angelia Mould, MD;  Location: [facility] OR;  Service: Vascular;  Laterality: Left;  . AV FISTULA PLACEMENT Right 05/06/2016   Procedure: INSERTION OF ARTERIOVENOUS (AV) GORE-TEX GRAFT  Right ARM;  Surgeon: Elam Dutch, MD;  Location: [facility];  Service: Vascular;  Laterality: Right;  . CYST EXCISION Right    cyst removed on thumb 2001  . INSERTION OF DIALYSIS CATHETER    . INSERTION OF DIALYSIS CATHETER Left 03/09/2014   Procedure: INSERTION OF DIALYSIS  CATHETER;  Surgeon: Angelia Mould, MD;  Location: [facility];  Service: Vascular;  Laterality: Left;  . IR THROMBECTOMY AV FISTULA W/THROMBOLYSIS/PTA INC/SHUNT/IMG RIGHT Right 11/01/2016  . IR US GUIDE VASC ACCESS RIGHT  11/01/2016  . IRRIGATION AND DEBRIDEMENT ABSCESS N/A 02/25/2016   Procedure: IRRIGATION AND DEBRIDEMENT SCROTAL ABSCESS;  Surgeon: Nickie Retort, MD;  [facility];  Service: Urology;  Laterality: N/A;    There were no vitals filed for this visit.  Subjective Assessment - 01/15/17 0948    Subjective  Pt notes he is feeling pretty stiff today. Otherwise, he note he thinks he'll need more PT after these.     Currently in Pain?  No/denies                      [facility] Adult PT Treatment/Exercise - 01/15/17 0001      Knee/Hip Exercises: Standing   Heel Raises  20 reps Pt required cueing to not use UE    Hip Flexion  Both;10 reps    Forward Lunges  Both;10 reps 4" step    Hip Abduction  Both;10 reps    Hip Extension  Both;10 reps    Lateral Step Up  Both;10 reps;Step Height: 4"    Forward Step  Up  Both;10 reps;Step Height: 4"    Functional Squat  10 reps bilateral UE assist     Rocker Board  1 minute B UE use    Rebounder  seated horizontal abduction blue tband x 15 posture    Gait Training  modified tandem in // bars x 3 RT               PT Short Term Goals - 12/23/16 1201      PT SHORT TERM GOAL #1   Title  Patient will be independent with HEP to improve functional mobility and safety during ambulation    Time  2    Period  Weeks    Status  New      PT SHORT TERM GOAL #2   Title  Patient will present with at least 1/2 grade increase with MMT testing to improve strength for functional mobility in community with decreased fall risk.     Time  3    Period  Weeks    Status  New      PT SHORT TERM GOAL #3   Title  Patient will complete 3MWT ambulation of 530 feet to promote improved community ambulation safety.     Baseline  3     Period  Weeks    Status  New        PT Long Term Goals - 12/23/16 1202      PT LONG TERM GOAL #1   Title  Patient will present with at least 1 grade increase with MMT testing to improve strength for functional mobility in community with decreased fall risk.     Time  5    Period  Weeks    Status  New    Target Date  01/27/17      PT LONG TERM GOAL #2   Title  Patient will complete 3MWT ambulation of > 650 feet to promote improved community ambulation safety.    Time  5    Period  Weeks    Status  New      PT LONG TERM GOAL #3   Title  Patient will complete TUG functional mobility testing in < 9 seconds to indicate improve gait speed increase functional independence with mobility.     Time  5    Period  Weeks    Status  New      PT LONG TERM GOAL #4   Title  Patient will complete 5 time STS testing in < 13 seconds indicating improved functional LE strength and increased endurance without use of UE.     Time  5    Period  Weeks    Status  New      PT LONG TERM GOAL #5   Title  Pt to be able to SLS for 12 seconds or greater to reduce risk of falling     Time  5    Period  Weeks    Status  New            Plan - 01/15/17 1020    Clinical Impression Statement  Today's session focused on functional strengthening in standing. Patient verbalized wanting to improve and get stronger so he can keep on coming. He was very compliant and willing to complete all exercises. He reports a good challenge with step exercises. Pt continues to demonstrate trunk flexion throughout exercises but is able to correct with some verbal cueing. Pt continues to have increased difficulty and is challenged  by additional functional strength exercises. Pt required frequent rest breaks throughout session. End of session bike for LE endurance x 5 (no charge).    Rehab Potential  Fair    Clinical Impairments Affecting Rehab Potential  PMHx, chronicity of symptoms     PT Frequency  2x / week    PT  Duration  Other (comment) 5 weeks    PT Treatment/Interventions  ADLs/Self Care Home Management;Gait training;Stair training;Functional mobility training;Therapeutic exercise;Therapeutic activities;Manual techniques;Patient/family education;Neuromuscular re-education;Balance training;Energy conservation    PT Next Visit Plan  Conitnue with focus on postural control during functional LE strength, endurance and balance.  Encouarge participation with HEP    PT Home Exercise Plan  Eval: sit to stand, seated hamstring stretch, supine SLR, bridge, prone hip extension        Patient will benefit from skilled therapeutic intervention in order to improve the following deficits and impairments:  Abnormal gait, Decreased endurance, Cardiopulmonary status limiting activity, Decreased activity tolerance, Decreased balance, Decreased mobility, Difficulty walking, Decreased strength, Obesity, Pain, Improper body mechanics, Postural dysfunction, Impaired flexibility  Visit Diagnosis: Abnormality of gait due to impairment of balance  Muscle weakness (generalized)  Impaired functional mobility, balance, and endurance     Problem List Patient Active Problem List   Diagnosis Date Noted  . Onychomycosis 06/17/2016  . Constipation 03/05/2016  . Anemia in chronic kidney disease (CKD) 03/05/2016  . Bleeding from wound 03/03/2016  . Chronic anticoagulation 03/03/2016  . Scrotal abscess 02/25/2016  . Type 2 diabetes mellitus ([place]) 02/25/2016  . Acute venous embolism and thrombosis of deep vessels of proximal lower extremity ([place]) [I82.4Y9] 02/11/2016  . Encounter for therapeutic drug monitoring 02/11/2016  . Nausea & vomiting 11/17/2014  . Thrombocytopenia ([place]) 02/21/2014  . Morbid obesity ([place]) 03/30/2013  . RBBB 03/29/2013  . Hyperkalemia 03/28/2013  . Leukocytosis 03/28/2013  . Nausea vomiting and diarrhea 03/28/2013  . BBB (bundle branch block) 03/28/2013  . Hypertension   . ESRD on dialysis ([place])    . Lack of coordination 04/15/2012  . Muscle weakness (generalized) 04/15/2012  . Arthritis, gouty 04/15/2012  . Difficulty in walking(719.7) 08/18/2011  . Weakness of both legs 08/18/2011   [place]arr [place] PT, DPT 10:25 AM, 01/15/17 [facility] [address] [place], [place], [address] Phone: [phone]   Fax:  [phone]  Name: Jack Irwin MRN: [id number] Date of Birth: [birth year]

## 2017-01-16 DIAGNOSIS — Z992 Dependence on renal dialysis: Secondary | ICD-10-CM | POA: Diagnosis not present

## 2017-01-16 DIAGNOSIS — N2581 Secondary hyperparathyroidism of renal origin: Secondary | ICD-10-CM | POA: Diagnosis not present

## 2017-01-16 DIAGNOSIS — N186 End stage renal disease: Secondary | ICD-10-CM | POA: Diagnosis not present

## 2017-01-16 DIAGNOSIS — D509 Iron deficiency anemia, unspecified: Secondary | ICD-10-CM | POA: Diagnosis not present

## 2017-01-19 DIAGNOSIS — N186 End stage renal disease: Secondary | ICD-10-CM | POA: Diagnosis not present

## 2017-01-19 DIAGNOSIS — Z992 Dependence on renal dialysis: Secondary | ICD-10-CM | POA: Diagnosis not present

## 2017-01-19 DIAGNOSIS — N2581 Secondary hyperparathyroidism of renal origin: Secondary | ICD-10-CM | POA: Diagnosis not present

## 2017-01-19 DIAGNOSIS — D509 Iron deficiency anemia, unspecified: Secondary | ICD-10-CM | POA: Diagnosis not present

## 2017-01-20 ENCOUNTER — Ambulatory Visit (HOSPITAL_COMMUNITY): Payer: Medicare Other

## 2017-01-21 DIAGNOSIS — Z992 Dependence on renal dialysis: Secondary | ICD-10-CM | POA: Diagnosis not present

## 2017-01-21 DIAGNOSIS — N2581 Secondary hyperparathyroidism of renal origin: Secondary | ICD-10-CM | POA: Diagnosis not present

## 2017-01-21 DIAGNOSIS — N186 End stage renal disease: Secondary | ICD-10-CM | POA: Diagnosis not present

## 2017-01-21 DIAGNOSIS — D509 Iron deficiency anemia, unspecified: Secondary | ICD-10-CM | POA: Diagnosis not present

## 2017-01-22 ENCOUNTER — Ambulatory Visit (HOSPITAL_COMMUNITY): Payer: Self-pay | Admitting: Physical Therapy

## 2017-01-23 DIAGNOSIS — N2581 Secondary hyperparathyroidism of renal origin: Secondary | ICD-10-CM | POA: Diagnosis not present

## 2017-01-23 DIAGNOSIS — N186 End stage renal disease: Secondary | ICD-10-CM | POA: Diagnosis not present

## 2017-01-23 DIAGNOSIS — D509 Iron deficiency anemia, unspecified: Secondary | ICD-10-CM | POA: Diagnosis not present

## 2017-01-23 DIAGNOSIS — Z992 Dependence on renal dialysis: Secondary | ICD-10-CM | POA: Diagnosis not present

## 2017-01-25 DIAGNOSIS — D509 Iron deficiency anemia, unspecified: Secondary | ICD-10-CM | POA: Diagnosis not present

## 2017-01-25 DIAGNOSIS — N2581 Secondary hyperparathyroidism of renal origin: Secondary | ICD-10-CM | POA: Diagnosis not present

## 2017-01-25 DIAGNOSIS — Z992 Dependence on renal dialysis: Secondary | ICD-10-CM | POA: Diagnosis not present

## 2017-01-25 DIAGNOSIS — N186 End stage renal disease: Secondary | ICD-10-CM | POA: Diagnosis not present

## 2017-01-26 ENCOUNTER — Ambulatory Visit (HOSPITAL_COMMUNITY): Payer: Self-pay | Admitting: Physical Therapy

## 2017-01-28 DIAGNOSIS — D509 Iron deficiency anemia, unspecified: Secondary | ICD-10-CM | POA: Diagnosis not present

## 2017-01-28 DIAGNOSIS — N186 End stage renal disease: Secondary | ICD-10-CM | POA: Diagnosis not present

## 2017-01-28 DIAGNOSIS — N2581 Secondary hyperparathyroidism of renal origin: Secondary | ICD-10-CM | POA: Diagnosis not present

## 2017-01-28 DIAGNOSIS — Z992 Dependence on renal dialysis: Secondary | ICD-10-CM | POA: Diagnosis not present

## 2017-01-29 ENCOUNTER — Telehealth (HOSPITAL_COMMUNITY): Payer: Self-pay | Admitting: Family Medicine

## 2017-01-29 ENCOUNTER — Ambulatory Visit (HOSPITAL_COMMUNITY): Payer: Medicare Other

## 2017-01-29 NOTE — Telephone Encounter (Signed)
01/29/17  pt called but didn't give a reason he just wanted to move the appt to next week

## 2017-01-30 DIAGNOSIS — N186 End stage renal disease: Secondary | ICD-10-CM | POA: Diagnosis not present

## 2017-01-30 DIAGNOSIS — D509 Iron deficiency anemia, unspecified: Secondary | ICD-10-CM | POA: Diagnosis not present

## 2017-01-30 DIAGNOSIS — N2581 Secondary hyperparathyroidism of renal origin: Secondary | ICD-10-CM | POA: Diagnosis not present

## 2017-01-30 DIAGNOSIS — Z992 Dependence on renal dialysis: Secondary | ICD-10-CM | POA: Diagnosis not present

## 2017-02-02 DIAGNOSIS — Z992 Dependence on renal dialysis: Secondary | ICD-10-CM | POA: Diagnosis not present

## 2017-02-02 DIAGNOSIS — N2581 Secondary hyperparathyroidism of renal origin: Secondary | ICD-10-CM | POA: Diagnosis not present

## 2017-02-02 DIAGNOSIS — D509 Iron deficiency anemia, unspecified: Secondary | ICD-10-CM | POA: Diagnosis not present

## 2017-02-02 DIAGNOSIS — N186 End stage renal disease: Secondary | ICD-10-CM | POA: Diagnosis not present

## 2017-02-03 ENCOUNTER — Other Ambulatory Visit: Payer: Self-pay

## 2017-02-03 ENCOUNTER — Encounter (HOSPITAL_COMMUNITY): Payer: Self-pay | Admitting: *Deleted

## 2017-02-03 ENCOUNTER — Emergency Department (HOSPITAL_COMMUNITY)
Admission: EM | Admit: 2017-02-03 | Discharge: 2017-02-03 | Disposition: A | Discharge status: Home / Self Care | Payer: Medicare Other | Attending: Emergency Medicine | Admitting: Emergency Medicine

## 2017-02-03 DIAGNOSIS — E0822 Diabetes mellitus due to underlying condition with diabetic chronic kidney disease: Secondary | ICD-10-CM | POA: Diagnosis not present

## 2017-02-03 DIAGNOSIS — N186 End stage renal disease: Secondary | ICD-10-CM | POA: Insufficient documentation

## 2017-02-03 DIAGNOSIS — Z79899 Other long term (current) drug therapy: Secondary | ICD-10-CM | POA: Insufficient documentation

## 2017-02-03 DIAGNOSIS — Z992 Dependence on renal dialysis: Secondary | ICD-10-CM | POA: Insufficient documentation

## 2017-02-03 DIAGNOSIS — Z7901 Long term (current) use of anticoagulants: Secondary | ICD-10-CM | POA: Diagnosis not present

## 2017-02-03 DIAGNOSIS — I120 Hypertensive chronic kidney disease with stage 5 chronic kidney disease or end stage renal disease: Secondary | ICD-10-CM | POA: Diagnosis not present

## 2017-02-03 DIAGNOSIS — K0889 Other specified disorders of teeth and supporting structures: Secondary | ICD-10-CM | POA: Insufficient documentation

## 2017-02-03 MED ORDER — AMOXICILLIN 500 MG PO CAPS: 500.0000 mg | ORAL_CAPSULE | Freq: Three times a day (TID) | ORAL | 0 refills | Status: DC

## 2017-02-03 MED ORDER — TRAMADOL HCL 50 MG PO TABS: 50.0000 mg | ORAL_TABLET | Freq: Four times a day (QID) | ORAL | 0 refills | Status: DC | PRN

## 2017-02-03 NOTE — ED Triage Notes (Addendum)
Toothache left upper jaw

## 2017-02-03 NOTE — ED Provider Notes (Signed)
[facility] EMERGENCY DEPARTMENT Provider Note   CSN: [id number] Arrival date & time: 02/03/17  1023     History   Chief Complaint Chief Complaint  Patient presents with  . Dental Pain    HPI Jack Irwin is a 52 y.o. male.  HPI   Jack Irwin is a 52 y.o. male with history of end-stage renal disease and on dialysis, and chronically anticoagulated on Eliquis, who presents to the Emergency Department complaining of left upper dental pain for several days.  He describes a throbbing pain to his left upper jaw that radiates to his ear.  Pain is worse with chewing.  He states that 1 of his upper left teeth feels loose.  He has not taken any medications for symptom relief.  He has not contacted a dentist.  He denies facial swelling, fever, chills, shortness of breath, bleeding of his gums and neck pain.  He states the pain is constant and keeping him from sleeping.   Past Medical History:  Diagnosis Date  . Anemia   . Arthritis   . BPH (benign prostatic hyperplasia)   . Chronic back pain   . Difficulty walking   . DVT (deep venous thrombosis) ([place])    Right popliteal DVT December 2017  . End-stage renal disease on hemodialysis ([place])    Dr. Lowanda Foster  . Erectile dysfunction   . Essential hypertension   . Gout   . Neuropathy, diabetic ([place])   . Type 2 diabetes mellitus ([place])   . Venous (peripheral) insufficiency     Patient Active Problem List   Diagnosis Date Noted  . Onychomycosis 06/17/2016  . Constipation 03/05/2016  . Anemia in chronic kidney disease (CKD) 03/05/2016  . Bleeding from wound 03/03/2016  . Chronic anticoagulation 03/03/2016  . Scrotal abscess 02/25/2016  . Type 2 diabetes mellitus ([place]) 02/25/2016  . Acute venous embolism and thrombosis of deep vessels of proximal lower extremity ([place]) [I82.4Y9] 02/11/2016  . Encounter for therapeutic drug monitoring 02/11/2016  . Nausea & vomiting 11/17/2014  . Thrombocytopenia ([place]) 02/21/2014  . Morbid  obesity ([place]) 03/30/2013  . RBBB 03/29/2013  . Hyperkalemia 03/28/2013  . Leukocytosis 03/28/2013  . Nausea vomiting and diarrhea 03/28/2013  . BBB (bundle branch block) 03/28/2013  . Hypertension   . ESRD on dialysis ([place])   . Lack of coordination 04/15/2012  . Muscle weakness (generalized) 04/15/2012  . Arthritis, gouty 04/15/2012  . Difficulty in walking(719.7) 08/18/2011  . Weakness of both legs 08/18/2011    Past Surgical History:  Procedure Laterality Date  . AV FISTULA PLACEMENT Left 03/09/2014   Procedure: INSERTION OF ARTERIOVENOUS (AV) GORE-TEX GRAFT ARM;  Surgeon: Angelia Mould, MD;  Location: [facility] OR;  Service: Vascular;  Laterality: Left;  . AV FISTULA PLACEMENT Right 05/06/2016   Procedure: INSERTION OF ARTERIOVENOUS (AV) GORE-TEX GRAFT  Right ARM;  Surgeon: Elam Dutch, MD;  Location: [place];  Service: Vascular;  Laterality: Right;  . CYST EXCISION Right    cyst removed on thumb 2001  . INSERTION OF DIALYSIS CATHETER    . INSERTION OF DIALYSIS CATHETER Left 03/09/2014   Procedure: INSERTION OF DIALYSIS CATHETER;  Surgeon: Angelia Mould, MD;  Location: [place];  Service: Vascular;  Laterality: Left;  . IR THROMBECTOMY AV FISTULA W/THROMBOLYSIS/PTA INC/SHUNT/IMG RIGHT Right 11/01/2016  . IR US GUIDE VASC ACCESS RIGHT  11/01/2016  . IRRIGATION AND DEBRIDEMENT ABSCESS N/A 02/25/2016   Procedure: IRRIGATION AND DEBRIDEMENT SCROTAL ABSCESS;  Surgeon: Nickie Retort,  MD;  Location: [facility];  Service: Urology;  Laterality: N/A;       Home Medications    Prior to Admission medications   Medication Sig Start Date End Date Taking? Authorizing Provider  apixaban (ELIQUIS) 2.5 MG TABS tablet Take 1 tablet (2.5 mg total) 2 (two) times daily by mouth. 12/16/16   Dorena Dew, FNP  betamethasone dipropionate (DIPROLENE) 0.05 % cream Apply topically 2 (two) times daily. 06/17/16   Dorena Dew, FNP  colchicine 0.6 MG tablet Take 0.5 tablets (0.3 mg total) by  mouth daily as needed (for gout). 05/06/16   Alvia [place], PA-C  diclofenac sodium (VOLTAREN) 1 % GEL Apply 4 g topically 4 (four) times daily. 06/17/16   Dorena Dew, FNP  gabapentin (NEURONTIN) 100 MG capsule Take 1 capsule (100 mg total) by mouth daily. 10/13/16   Dorena Dew, FNP  Multiple Vitamin (DAILY VITE PO) Take 1 tablet by mouth daily.    [provider]  nystatin (NYSTATIN) powder Apply topically 2 (two) times daily. Patient not taking: Reported on 09/01/2016 08/28/16   Dorena Dew, FNP  sevelamer carbonate (RENVELA) 800 MG tablet Take 1 tablet (800 mg total) by mouth 2 (two) times daily between meals as needed (With snacks). Patient taking differently: Take 800-3,200 mg by mouth 5 (five) times daily. Take 3200mg  three times a day with meals and take 800mg  twice a day with snacks 02/29/16   Cristal Ford, DO  sildenafil (REVATIO) 20 MG tablet Take 1 tablet (20 mg total) by mouth daily as needed. Dissolved under tongue (TROCHE) 60 minutes prior to sexual encounter 11/06/16   Dorena Dew, FNP  traMADol (ULTRAM) 50 MG tablet Take 1 tablet (50 mg total) by mouth every 6 (six) hours as needed. Patient not taking: Reported on 11/01/2016 08/27/16   Milton Ferguson, MD  triamcinolone cream (KENALOG) 0.1 % Apply 1 application topically 2 (two) times daily as needed.     [provider]  ULORIC 40 MG tablet Take 1 tablet (40 mg total) daily by mouth. 12/22/16   Dorena Dew, FNP    Family History Family History  Problem Relation Age of Onset  . Diabetes Mother   . Hypertension Mother   . Heart failure Mother   . Hyperlipidemia Mother   . Heart attack Mother   . Cancer Father   . Diabetes Father   . Hypertension Father   . Hyperlipidemia Father     Social History Social History   Tobacco Use  . Smoking status: Never Smoker  . Smokeless tobacco: Never Used  Substance Use Topics  . Alcohol use: No    Alcohol/week: 0.0 oz  . Drug use: No      Allergies   Doxycycline   Review of Systems Review of Systems  Constitutional: Negative for appetite change and fever.  HENT: Positive for dental problem. Negative for congestion, facial swelling, sore throat and trouble swallowing.   Eyes: Negative for pain and visual disturbance.  Musculoskeletal: Negative for neck pain and neck stiffness.  Neurological: Negative for dizziness, facial asymmetry and headaches.  Hematological: Negative for adenopathy.  All other systems reviewed and are negative.    Physical Exam Updated Vital Signs BP 95/79   Pulse (!) 111   Temp 98.4 F (36.9 C)   Resp 20   Ht 5\' 11"  (1.803 m)   Wt 118.8 kg (262 lb)   SpO2 97%   BMI 36.54 kg/m   Physical Exam  Constitutional: He is oriented to person, place, and time. He appears well-developed and well-nourished. No distress.  HENT:  Head: Normocephalic and atraumatic.  Right Ear: Tympanic membrane and ear canal normal.  Left Ear: Tympanic membrane and ear canal normal.  Mouth/Throat: Uvula is midline, oropharynx is clear and moist and mucous membranes are normal. No trismus in the jaw. Dental caries present. No dental abscesses or uvula swelling.  ttp and dental caries of the left upper second premolar, mild erythema of the surrounding gingiva. Partially endentoulous.  No facial swelling, obvious dental abscess, trismus, or sublingual abnml.    Neck: Normal range of motion. Neck supple.  Cardiovascular: Normal rate, regular rhythm and normal heart sounds.  No murmur heard. Pulmonary/Chest: Effort normal and breath sounds normal.  Musculoskeletal: Normal range of motion.  Lymphadenopathy:    He has no cervical adenopathy.  Neurological: He is alert and oriented to person, place, and time. He exhibits normal muscle tone. Coordination normal.  Skin: Skin is warm and dry.  Nursing note and vitals reviewed.    ED Treatments / Results  Labs (all labs ordered are listed, but only abnormal  results are displayed) Labs Reviewed - No data to display  EKG  EKG Interpretation None       Radiology No results found.  Procedures Procedures (including critical care time)  Medications Ordered in ED Medications - No data to display   Initial Impression / Assessment and Plan / ED Course  I have reviewed the triage vital signs and the nursing notes.  Pertinent labs & imaging results that were available during my care of the patient were reviewed by me and considered in my medical decision making (see chart for details).     Pt well appearing, smiling and talking with nursing staff.  Airway patent. no concerning sx's for dental abscess or Ludwig's angina.  Patient advised that he will need to arrange follow-up with dentistry, referral information provided.  Pt reviewed on the narcotic database.  No rx's on file since July  Final Clinical Impressions(s) / ED Diagnoses   Final diagnoses:  Pain, dental    ED Discharge Orders    None       Kem Parkinson, PA-C 02/03/17 1118    Isla Pence, MD 02/03/17 1321

## 2017-02-03 NOTE — Discharge Instructions (Signed)
Contact one of the dentist on the list provided to arrange a follow-up appointment.  Warm salt water rinses to your mouth.

## 2017-02-04 DIAGNOSIS — N186 End stage renal disease: Secondary | ICD-10-CM | POA: Diagnosis not present

## 2017-02-04 DIAGNOSIS — D509 Iron deficiency anemia, unspecified: Secondary | ICD-10-CM | POA: Diagnosis not present

## 2017-02-04 DIAGNOSIS — N2581 Secondary hyperparathyroidism of renal origin: Secondary | ICD-10-CM | POA: Diagnosis not present

## 2017-02-04 DIAGNOSIS — Z992 Dependence on renal dialysis: Secondary | ICD-10-CM | POA: Diagnosis not present

## 2017-02-05 ENCOUNTER — Other Ambulatory Visit: Payer: Self-pay

## 2017-02-05 ENCOUNTER — Encounter (HOSPITAL_COMMUNITY): Payer: Self-pay

## 2017-02-05 ENCOUNTER — Ambulatory Visit (HOSPITAL_COMMUNITY): Payer: Medicare Other | Attending: Family Medicine

## 2017-02-05 DIAGNOSIS — M6281 Muscle weakness (generalized): Secondary | ICD-10-CM | POA: Diagnosis not present

## 2017-02-05 DIAGNOSIS — R2689 Other abnormalities of gait and mobility: Secondary | ICD-10-CM | POA: Diagnosis not present

## 2017-02-05 DIAGNOSIS — Z7409 Other reduced mobility: Secondary | ICD-10-CM | POA: Diagnosis not present

## 2017-02-05 NOTE — Therapy (Addendum)
[place], [place], [address] Phone: [phone]   Fax:  [phone]  Physical Therapy Treatment/Re-Assessment  Patient Details  Name: Jack Irwin MRN: [id number] Date of Birth: December 06, 1965 Referring Provider: Dorena Dew FNP   Encounter Date: 02/05/2017  PT End of Session - 02/05/17 0956    Visit Number  1    Number of Visits  9    Date for PT Re-Evaluation  01/20/17    Authorization Type  Medicare Part A     Authorization Time Period  02/05/2017 - 03/06/2017    Authorization - Visit Number  --    Authorization - Number of Visits  --    PT Start Time  0954    PT Stop Time  1032    PT Time Calculation (min)  38 min    Activity Tolerance  Patient tolerated treatment well    Behavior During Therapy  [facility] for tasks assessed/performed       Past Medical History:  Diagnosis Date  . Anemia   . Arthritis   . BPH (benign prostatic hyperplasia)   . Chronic back pain   . Difficulty walking   . DVT (deep venous thrombosis) ([place])    Right popliteal DVT December 2017  . End-stage renal disease on hemodialysis ([place])    Dr. Lowanda Foster  . Erectile dysfunction   . Essential hypertension   . Gout   . Neuropathy, diabetic ([place])   . Type 2 diabetes mellitus ([place])   . Venous (peripheral) insufficiency     Past Surgical History:  Procedure Laterality Date  . AV FISTULA PLACEMENT Left 03/09/2014   Procedure: INSERTION OF ARTERIOVENOUS (AV) GORE-TEX GRAFT ARM;  Surgeon: Angelia Mould, MD;  Location: [facility] OR;  Service: Vascular;  Laterality: Left;  . AV FISTULA PLACEMENT Right 05/06/2016   Procedure: INSERTION OF ARTERIOVENOUS (AV) GORE-TEX GRAFT  Right ARM;  Surgeon: Elam Dutch, MD;  Location: [place];  Service: Vascular;  Laterality: Right;  . CYST EXCISION Right    cyst removed on thumb 2001  . INSERTION OF DIALYSIS CATHETER    . INSERTION OF DIALYSIS CATHETER Left 03/09/2014   Procedure: INSERTION OF  DIALYSIS CATHETER;  Surgeon: Angelia Mould, MD;  Location: [place];  Service: Vascular;  Laterality: Left;  . IR THROMBECTOMY AV FISTULA W/THROMBOLYSIS/PTA INC/SHUNT/IMG RIGHT Right 11/01/2016  . IR US GUIDE VASC ACCESS RIGHT  11/01/2016  . IRRIGATION AND DEBRIDEMENT ABSCESS N/A 02/25/2016   Procedure: IRRIGATION AND DEBRIDEMENT SCROTAL ABSCESS;  Surgeon: Nickie Retort, MD;  Location: [facility];  Service: Urology;  Laterality: N/A;    There were no vitals filed for this visit.  Subjective Assessment - 02/05/17 0957    Subjective  Patient reports having had a lot going on in last month with the holidays and recent deaths in his family. He has been trying to keep up with HEP and reports he is praticipating in it every other day. He would like to continue with therapy as he feels it has really helped him before.   Pertinent History  Bilateral neuropathy, Rhematoid arthritis and end stage renal disease    Limitations  House hold activities    How long can you sit comfortably?  unlimited    How long can you stand comfortably?  5-10 minutes    How long can you walk comfortably?  walking helps a litt bit , 1-2 mimutes    Patient Stated Goals  To  improve posture during ambulation and strength, want to stand up 30-40 minutes    Currently in Pain?  Yes    Pain Score  3     Pain Location  Foot toes    Pain Orientation  Right;Left    Pain Descriptors / Indicators  Burning    Pain Type  Chronic pain    Pain Onset  More than a month ago    Pain Frequency  Constant    Aggravating Factors   weight bearing    Pain Relieving Factors  rest, tylenol    Effect of Pain on Daily Activities  greatr difficulty    Multiple Pain Sites  No       [facility] PT Assessment - 02/05/17 0001      Assessment   Medical Diagnosis  Neuropathy, weakness    Referring Provider  Dorena Dew FNP    Next MD Visit  unsure    Prior Therapy  yes      Precautions   Precautions  None      Restrictions   Weight  Bearing Restrictions  No      Balance Screen   Has the patient fallen in the past 6 months  No    Has the patient had a decrease in activity level because of a fear of falling?   No    Is the patient reluctant to leave their home because of a fear of falling?   No      Cognition   Overall Cognitive Status  Within Functional Limits for tasks assessed      AROM   Overall AROM   Within functional limits for tasks performed      Strength   Right Hip Flexion  5/5    Right Hip ABduction  5/5 use of psoas/TFL    Left Hip Flexion  5/5    Left Hip ABduction  5/5 use of psoas/TFL      Flexibility   Hamstrings  WFL      Transfers   Five time sit to stand comments   12.4    Comments  completed withou use of UE, however required momentum thoughout      Ambulation/Gait   Ambulation Distance (Feet)  390 Feet 3MWT    Gait Pattern  Trunk flexed;Decreased weight shift to left;Decreased hip/knee flexion - right;Decreased hip/knee flexion - left;Step-through pattern;Wide base of support    Ambulation Surface  Level    Gait Comments  patient overall unsteady with trunk flexion increasing as patient gets fatigued throughout      Static Standing Balance   Static Standing - Comment/# of Minutes  LLE: 4-5 seconds; RLE: 5-7 seconds      Standardized Balance Assessment   Standardized Balance Assessment  Timed Up and Go Test      Timed Up and Go Test   Normal TUG (seconds)  9.7        [facility] Adult PT Treatment/Exercise - 02/05/17 0001      Ambulation/Gait   Assistive device  None      Posture/Postural Control   Posture/Postural Control  Postural limitations    Postural Limitations  Rounded Shoulders;Forward head;Flexed trunk      Knee/Hip Exercises: Aerobic   Stationary Bike  EOS x 5 minutes  not billed      Knee/Hip Exercises: Standing   Lateral Step Up  Step Height: 4";Both;15 reps intermittent UE support    Forward Step Up  Step Height: 4";15 reps;Both intermittent UE  support         PT Education - 02/05/17 1047    Education provided  Yes    Education Details  Reviewed goals and overall progress as well as remaining deficits. Discussed continuing POC and importance of patient compliance with attendence for therapy to see greater improvements. Provided handout and educated on benefit of [employer] membership/participation to increase activity level and overall activity tolerance.     Person(s) Educated  Patient    Methods  Explanation;Handout;Demonstration    Comprehension  Verbalized understanding;Need further instruction       PT Short Term Goals - 02/05/17 1018      PT SHORT TERM GOAL #1   Title  Patient will be independent with HEP to improve functional mobility and safety during ambulation    Baseline  02/05/17 - pateitn is participating in every other day, self report    Time  2    Period  Weeks    Status  Partially Met    Target Date  02/19/17      PT SHORT TERM GOAL #2   Title  Patient will present with at least 1/2 grade increase with MMT testing to improve strength for functional mobility in community with decreased fall risk.     Baseline  02/05/17 - improved by 1 grade for gorups tested     Time  2    Period  Weeks    Status  Achieved      PT SHORT TERM GOAL #3   Title  Patient will complete 3MWT ambulation of 530 feet to promote improved community ambulation safety.     Baseline  02/05/17 - 390 feet with no device, trunk flexion increaed as patient fatigued    Time  2    Period  Weeks    Status  On-going        PT Long Term Goals - 02/05/17 1021      PT LONG TERM GOAL #1   Title  Patient will present with at least 1 grade increase with MMT testing to improve strength for functional mobility in community with decreased fall risk.     Baseline  02/05/17 - all groups tested improved by 1 grade     Time  4    Period  Weeks    Status  Achieved    Target Date  03/05/17      PT LONG TERM GOAL #2   Title  Patient will complete 3MWT ambulation of > 650  feet to promote improved community ambulation safety.    Baseline  02/05/17 - 390 feet with no device, trunk flexion increaed as patient fatigued    Time  4    Period  Weeks    Status  On-going      PT LONG TERM GOAL #3   Title  Patient will complete TUG functional mobility testing in < 9 seconds to indicate improve gait speed increase functional independence with mobility.     Baseline  02/05/17 - 9.7 seconds, patient appeared unsteady throughout test    Time  4    Period  Weeks    Status  On-going      PT LONG TERM GOAL #4   Title  Patient will complete 5 time STS testing in < 13 seconds indicating improved functional LE strength and increased endurance without use of UE.     Baseline  02/05/17 - 12.4 seconds, patient performed without UE use, continues to use body/momentum to initiate standing  Time  4    Period  Weeks    Status  Achieved      PT LONG TERM GOAL #5   Title  Pt to be able to SLS for 12 seconds or greater to reduce risk of falling     Baseline  02/05/17 - RLE: 5-7 seconds; LLE: 4-5 seconds    Time  4    Period  Weeks    Status  On-going        Plan - 02/05/17 0957    Clinical Impression Statement  Re-assessment was performed today and the patient has met/partially met 2/3 short term goals, and 2/5 long term goals. He has demonstrated improved BLE strength and mild improvement to balance in singe limb stance. He continues to be limited with ambulation and functional mobility due to postural deficits and unsteadiness with dynamic gait. Time was spent today educating patient on importance of compliance with therapy attendance to see greater improvements, he verbalized his understanding and demonstrated interest in participating at a local fitness facility. He will continue to benefit from skilled PT to address ongoing impairments and progress towards remaining goals to improve independence with mobility and overall QOL.     Rehab Potential  Fair    Clinical Impairments  Affecting Rehab Potential  PMHx, chronicity of symptoms     PT Frequency  2x / week    PT Duration  4 weeks    PT Treatment/Interventions  ADLs/Self Care Home Management;Gait training;Stair training;Functional mobility training;Therapeutic exercise;Therapeutic activities;Manual techniques;Patient/family education;Neuromuscular re-education;Balance training;Energy conservation;DME Instruction    PT Next Visit Plan  Introduce postural control exercises to target lumbar extensors for endurance. Continue with functional LE strength, endurance and balance.  Encouarge participation with HE and participation at local fitness facility. Update HEP next session.    PT Home Exercise Plan  Eval: sit to stand, seated hamstring stretch, supine SLR, bridge, prone hip extension     Consulted and Agree with Plan of Care  Patient       Patient will benefit from skilled therapeutic intervention in order to improve the following deficits and impairments:  Abnormal gait, Decreased endurance, Cardiopulmonary status limiting activity, Decreased activity tolerance, Decreased balance, Decreased mobility, Difficulty walking, Decreased strength, Obesity, Pain, Improper body mechanics, Postural dysfunction, Impaired flexibility  Visit Diagnosis: Abnormality of gait due to impairment of balance - Plan: PT plan of care cert/re-cert  Muscle weakness (generalized) - Plan: PT plan of care cert/re-cert  Impaired functional mobility, balance, and endurance - Plan: PT plan of care cert/re-cert     Problem List Patient Active Problem List   Diagnosis Date Noted  . Onychomycosis 06/17/2016  . Constipation 03/05/2016  . Anemia in chronic kidney disease (CKD) 03/05/2016  . Bleeding from wound 03/03/2016  . Chronic anticoagulation 03/03/2016  . Scrotal abscess 02/25/2016  . Type 2 diabetes mellitus ([place]) 02/25/2016  . Acute venous embolism and thrombosis of deep vessels of proximal lower extremity ([place]) [I82.4Y9] 02/11/2016   . Encounter for therapeutic drug monitoring 02/11/2016  . Nausea & vomiting 11/17/2014  . Thrombocytopenia ([place]) 02/21/2014  . Morbid obesity ([place]) 03/30/2013  . RBBB 03/29/2013  . Hyperkalemia 03/28/2013  . Leukocytosis 03/28/2013  . Nausea vomiting and diarrhea 03/28/2013  . BBB (bundle branch block) 03/28/2013  . Hypertension   . ESRD on dialysis ([place])   . Lack of coordination 04/15/2012  . Muscle weakness (generalized) 04/15/2012  . Arthritis, gouty 04/15/2012  . Difficulty in walking(719.7) 08/18/2011  . Weakness of both legs  08/18/2011    Kipp Brood, PT, DPT Physical Therapist with [facility]  02/05/2017 11:38 AM    [place], [place], [address] Phone: [phone]   Fax:  [phone]  Name: Jack Irwin MRN: [id number] Date of Birth: Jul 21, 1965

## 2017-02-06 DIAGNOSIS — D509 Iron deficiency anemia, unspecified: Secondary | ICD-10-CM | POA: Diagnosis not present

## 2017-02-06 DIAGNOSIS — Z992 Dependence on renal dialysis: Secondary | ICD-10-CM | POA: Diagnosis not present

## 2017-02-06 DIAGNOSIS — N186 End stage renal disease: Secondary | ICD-10-CM | POA: Diagnosis not present

## 2017-02-06 DIAGNOSIS — N2581 Secondary hyperparathyroidism of renal origin: Secondary | ICD-10-CM | POA: Diagnosis not present

## 2017-02-09 DIAGNOSIS — N186 End stage renal disease: Secondary | ICD-10-CM | POA: Diagnosis not present

## 2017-02-09 DIAGNOSIS — Z992 Dependence on renal dialysis: Secondary | ICD-10-CM | POA: Diagnosis not present

## 2017-02-09 DIAGNOSIS — N2581 Secondary hyperparathyroidism of renal origin: Secondary | ICD-10-CM | POA: Diagnosis not present

## 2017-02-09 DIAGNOSIS — D509 Iron deficiency anemia, unspecified: Secondary | ICD-10-CM | POA: Diagnosis not present

## 2017-02-10 ENCOUNTER — Ambulatory Visit (HOSPITAL_COMMUNITY): Payer: Medicare Other

## 2017-02-10 DIAGNOSIS — M6281 Muscle weakness (generalized): Secondary | ICD-10-CM | POA: Diagnosis not present

## 2017-02-10 DIAGNOSIS — Z7409 Other reduced mobility: Secondary | ICD-10-CM

## 2017-02-10 DIAGNOSIS — R2689 Other abnormalities of gait and mobility: Secondary | ICD-10-CM | POA: Diagnosis not present

## 2017-02-10 NOTE — Therapy (Signed)
[place], [place], [address] Phone: [phone]   Fax:  [phone]  Physical Therapy Treatment  Patient Details  Name: Jack Irwin MRN: [id number] Date of Birth: 1965-02-09 Referring Provider: Dorena Dew FNP   Encounter Date: 02/10/2017  PT End of Session - 02/10/17 1153    Visit Number  2    Number of Visits  9    Date for PT Re-Evaluation  02/26/17    Authorization Type  Medicare Part A     Authorization Time Period  02/05/2017 - 03/06/2017    Authorization - Visit Number  4    Authorization - Number of Visits  10    PT Start Time  1120    PT Stop Time  3748    PT Time Calculation (min)  32 min    Activity Tolerance  Patient tolerated treatment well;No increased pain    Behavior During Therapy  WFL for tasks assessed/performed       Past Medical History:  Diagnosis Date  . Anemia   . Arthritis   . BPH (benign prostatic hyperplasia)   . Chronic back pain   . Difficulty walking   . DVT (deep venous thrombosis) ([place])    Right popliteal DVT December 2017  . End-stage renal disease on hemodialysis ([place])    Dr. Lowanda Foster  . Erectile dysfunction   . Essential hypertension   . Gout   . Neuropathy, diabetic ([place])   . Type 2 diabetes mellitus ([place])   . Venous (peripheral) insufficiency     Past Surgical History:  Procedure Laterality Date  . AV FISTULA PLACEMENT Left 03/09/2014   Procedure: INSERTION OF ARTERIOVENOUS (AV) GORE-TEX GRAFT ARM;  Surgeon: Angelia Mould, MD;  Location: [facility] OR;  Service: Vascular;  Laterality: Left;  . AV FISTULA PLACEMENT Right 05/06/2016   Procedure: INSERTION OF ARTERIOVENOUS (AV) GORE-TEX GRAFT  Right ARM;  Surgeon: Elam Dutch, MD;  Location: [facility];  Service: Vascular;  Laterality: Right;  . CYST EXCISION Right    cyst removed on thumb 2001  . INSERTION OF DIALYSIS CATHETER    . INSERTION OF DIALYSIS CATHETER Left 03/09/2014   Procedure: INSERTION OF  DIALYSIS CATHETER;  Surgeon: Angelia Mould, MD;  Location: [facility];  Service: Vascular;  Laterality: Left;  . IR THROMBECTOMY AV FISTULA W/THROMBOLYSIS/PTA INC/SHUNT/IMG RIGHT Right 11/01/2016  . IR US GUIDE VASC ACCESS RIGHT  11/01/2016  . IRRIGATION AND DEBRIDEMENT ABSCESS N/A 02/25/2016   Procedure: IRRIGATION AND DEBRIDEMENT SCROTAL ABSCESS;  Surgeon: Nickie Retort, MD;  Location: [facility];  Service: Urology;  Laterality: N/A;    There were no vitals filed for this visit.  Subjective Assessment - 02/10/17 1124    Subjective  Pt reports hes doing ok today. Trying to work on posture and walking tall at home.     Pertinent History  Bilateral neuropathy, Rhematoid arthritis and end stage renal disease    Limitations  House hold activities    Currently in Pain?  Yes    Pain Score  -- -45/10 in hands, knees, feet             [facility] Adult PT Treatment/Exercise - 02/10/17 0001      Knee/Hip Exercises: Seated   Long Arc Quad  1 set;10 reps;Both 3sHold    Sit to [employer]  1 set 1x8 elevated surface      Knee/Hip Exercises: Supine   Bridges  2 sets;10 reps  Other Supine Knee/Hip Exercises  blue TB clams: 2x15    Other Supine Knee/Hip Exercises  reverse curl-up: 2x10              PT Education - 02/10/17 1153    Education provided  Yes    Education Details  HEP educaiton    Person(s) Educated  Patient    Methods  Explanation    Comprehension  Verbalized understanding       PT Short Term Goals - 02/05/17 1018      PT SHORT TERM GOAL #1   Title  Patient will be independent with HEP to improve functional mobility and safety during ambulation    Baseline  02/05/17 - pateitn is participating in every other day, self report    Time  2    Period  Weeks    Status  Partially Met      PT SHORT TERM GOAL #2   Title  Patient will present with at least 1/2 grade increase with MMT testing to improve strength for functional mobility in community with decreased fall risk.      Baseline  02/05/17 - improved by 1 grade for gorups tested     Time  3    Period  Weeks    Status  Achieved      PT SHORT TERM GOAL #3   Title  Patient will complete 3MWT ambulation of 530 feet to promote improved community ambulation safety.     Baseline  02/05/17 - 390 feet with no device, trunk flexion increaed as patient fatigued    Period  Weeks    Status  On-going        PT Long Term Goals - 02/05/17 1021      PT LONG TERM GOAL #1   Title  Patient will present with at least 1 grade increase with MMT testing to improve strength for functional mobility in community with decreased fall risk.     Baseline  02/05/17 - all groups tested improved by 1 grade     Time  5    Period  Weeks    Status  Achieved      PT LONG TERM GOAL #2   Title  Patient will complete 3MWT ambulation of > 650 feet to promote improved community ambulation safety.    Baseline  02/05/17 - 390 feet with no device, trunk flexion increaed as patient fatigued    Time  5    Period  Weeks    Status  On-going      PT LONG TERM GOAL #3   Title  Patient will complete TUG functional mobility testing in < 9 seconds to indicate improve gait speed increase functional independence with mobility.     Baseline  02/05/17 - 9.7 seconds, patient appeared unsteady throughout test    Time  5    Period  Weeks    Status  On-going      PT LONG TERM GOAL #4   Title  Patient will complete 5 time STS testing in < 13 seconds indicating improved functional LE strength and increased endurance without use of UE.     Baseline  02/05/17 - 12.4 seconds, patient performed without UE use, continues to use body/momentum to initiate standing    Time  5    Period  Weeks    Status  Achieved      PT LONG TERM GOAL #5   Title  Pt to be able to SLS for 12 seconds  or greater to reduce risk of falling     Baseline  02/05/17 - RLE: 5-7 seconds; LLE: 4-5 seconds    Time  5    Period  Weeks    Status  On-going            Plan - 02/10/17 1158     Clinical Impression Statement  SESSION ENDED EARLY AS PATIENT REPORTED NEEDING TO LEAVE EARLY TODAY. rEVIEWED TREATMENT GOALS AND EXAMINATION FINDINGS WITH PATIENT. pT REPORTS HE NO LONGER HAS HIS hep SHEET, THUS hep IS REESTABLISHED FROM OLD SHEET ADNNEW FINDINGS FROM REASSESSMENT. PT TOLERATES TREATMENT SESSION WELL, AND hep HANDOUT IS REVIEWED IN FULL WITH INSTRUCTIONS FOR COMPLETION. NO INCREASED PAIN THIS VISIT. PT REPORTS HIS WIFE WILL ASSIST WITH THERABAND MANAGEMENT. PT MAKING GOOD PROGRESS TOWARD GOALS OVERALL.     Clinical Impairments Affecting Rehab Potential  PMHx, chronicity of symptoms     PT Frequency  2x / week    PT Duration  4 weeks    PT Treatment/Interventions  ADLs/Self Care Home Management;Gait training;Stair training;Functional mobility training;Therapeutic exercise;Therapeutic activities;Manual techniques;Patient/family education;Neuromuscular re-education;Balance training;Energy conservation;DME Instruction    PT Next Visit Plan  Continue with functional LE strength, endurance and balance.  Encouarge participation with HEP and participation at local fitness facility.     PT Home Exercise Plan  Eval: sit to stand, seated hamstring stretch, supine SLR, bridge, prone hip extension; UPDATED 02/10/17: BRIDGE, BAND CLAM, REVERSE CURL UP, LAQ, STS    Consulted and Agree with Plan of Care  Patient       Patient will benefit from skilled therapeutic intervention in order to improve the following deficits and impairments:  Abnormal gait, Decreased endurance, Cardiopulmonary status limiting activity, Decreased activity tolerance, Decreased balance, Decreased mobility, Difficulty walking, Decreased strength, Obesity, Pain, Improper body mechanics, Postural dysfunction, Impaired flexibility  Visit Diagnosis: Abnormality of gait due to impairment of balance  Muscle weakness (generalized)  Impaired functional mobility, balance, and endurance     Problem List Patient Active Problem  List   Diagnosis Date Noted  . Onychomycosis 06/17/2016  . Constipation 03/05/2016  . Anemia in chronic kidney disease (CKD) 03/05/2016  . Bleeding from wound 03/03/2016  . Chronic anticoagulation 03/03/2016  . Scrotal abscess 02/25/2016  . Type 2 diabetes mellitus ([place]) 02/25/2016  . Acute venous embolism and thrombosis of deep vessels of proximal lower extremity ([place]) [I82.4Y9] 02/11/2016  . Encounter for therapeutic drug monitoring 02/11/2016  . Nausea & vomiting 11/17/2014  . Thrombocytopenia ([place]) 02/21/2014  . Morbid obesity ([place]) 03/30/2013  . RBBB 03/29/2013  . Hyperkalemia 03/28/2013  . Leukocytosis 03/28/2013  . Nausea vomiting and diarrhea 03/28/2013  . BBB (bundle branch block) 03/28/2013  . Hypertension   . ESRD on dialysis ([place])   . Lack of coordination 04/15/2012  . Muscle weakness (generalized) 04/15/2012  . Arthritis, gouty 04/15/2012  . Difficulty in walking(719.7) 08/18/2011  . Weakness of both legs 08/18/2011   12:05 PM, 02/10/17 Etta Grandchild, PT, DPT Physical Therapist at [facility] [phone] (office)      Etta Grandchild 02/10/2017, 12:04 PM  [place], [place], [address] Phone: [phone]   Fax:  [phone]  Name: HAMDAN TOSCANO MRN: [id number] Date of Birth: 1965/04/01

## 2017-02-11 ENCOUNTER — Ambulatory Visit: Payer: Self-pay | Admitting: Family Medicine

## 2017-02-11 ENCOUNTER — Ambulatory Visit: Payer: Self-pay | Admitting: Urology

## 2017-02-12 ENCOUNTER — Telehealth (HOSPITAL_COMMUNITY): Payer: Self-pay | Admitting: Family Medicine

## 2017-02-12 ENCOUNTER — Ambulatory Visit (HOSPITAL_COMMUNITY): Payer: Medicare Other

## 2017-02-12 NOTE — Telephone Encounter (Signed)
02/12/17  pt left a message to cx app he said he got tied up with some business

## 2017-02-13 DIAGNOSIS — Z992 Dependence on renal dialysis: Secondary | ICD-10-CM | POA: Diagnosis not present

## 2017-02-13 DIAGNOSIS — D509 Iron deficiency anemia, unspecified: Secondary | ICD-10-CM | POA: Diagnosis not present

## 2017-02-13 DIAGNOSIS — N186 End stage renal disease: Secondary | ICD-10-CM | POA: Diagnosis not present

## 2017-02-13 DIAGNOSIS — N2581 Secondary hyperparathyroidism of renal origin: Secondary | ICD-10-CM | POA: Diagnosis not present

## 2017-02-16 DIAGNOSIS — E119 Type 2 diabetes mellitus without complications: Secondary | ICD-10-CM | POA: Diagnosis not present

## 2017-02-16 DIAGNOSIS — Z992 Dependence on renal dialysis: Secondary | ICD-10-CM | POA: Diagnosis not present

## 2017-02-16 DIAGNOSIS — N186 End stage renal disease: Secondary | ICD-10-CM | POA: Diagnosis not present

## 2017-02-16 DIAGNOSIS — N2581 Secondary hyperparathyroidism of renal origin: Secondary | ICD-10-CM | POA: Diagnosis not present

## 2017-02-16 DIAGNOSIS — D509 Iron deficiency anemia, unspecified: Secondary | ICD-10-CM | POA: Diagnosis not present

## 2017-02-17 ENCOUNTER — Telehealth (HOSPITAL_COMMUNITY): Payer: Self-pay | Admitting: Family Medicine

## 2017-02-17 ENCOUNTER — Ambulatory Visit (HOSPITAL_COMMUNITY): Payer: Medicare Other

## 2017-02-17 NOTE — Telephone Encounter (Signed)
02/17/17  When patient called to cx today he said he wanted to come in Thursday and I made him an appt at 3:15 but then looked and saw he was already scheduled for 2:30.  I called him back and left him a message to come at 2:30 instead.

## 2017-02-17 NOTE — Telephone Encounter (Signed)
02/17/17  pt called and said something came up and he can't come in

## 2017-02-18 DIAGNOSIS — N2581 Secondary hyperparathyroidism of renal origin: Secondary | ICD-10-CM | POA: Diagnosis not present

## 2017-02-18 DIAGNOSIS — D509 Iron deficiency anemia, unspecified: Secondary | ICD-10-CM | POA: Diagnosis not present

## 2017-02-18 DIAGNOSIS — Z992 Dependence on renal dialysis: Secondary | ICD-10-CM | POA: Diagnosis not present

## 2017-02-18 DIAGNOSIS — N186 End stage renal disease: Secondary | ICD-10-CM | POA: Diagnosis not present

## 2017-02-19 ENCOUNTER — Ambulatory Visit (HOSPITAL_COMMUNITY): Payer: Medicare Other

## 2017-02-19 ENCOUNTER — Encounter (HOSPITAL_COMMUNITY): Payer: Self-pay

## 2017-02-19 ENCOUNTER — Ambulatory Visit: Payer: Self-pay | Admitting: Family Medicine

## 2017-02-19 ENCOUNTER — Other Ambulatory Visit: Payer: Self-pay

## 2017-02-19 DIAGNOSIS — Z7409 Other reduced mobility: Secondary | ICD-10-CM | POA: Diagnosis not present

## 2017-02-19 DIAGNOSIS — M6281 Muscle weakness (generalized): Secondary | ICD-10-CM | POA: Diagnosis not present

## 2017-02-19 DIAGNOSIS — R2689 Other abnormalities of gait and mobility: Secondary | ICD-10-CM

## 2017-02-19 NOTE — Therapy (Signed)
[place], [place], [address] Phone: [phone]   Fax:  [phone]  Physical Therapy Treatment  Patient Details  Name: Jack Irwin MRN: [id number] Date of Birth: 1965/05/02 Referring Provider: Dorena Dew FNP   Encounter Date: 02/19/2017  PT End of Session - 02/19/17 1448    Visit Number  3    Number of Visits  9    Date for PT Re-Evaluation  02/26/17    Authorization Type  Medicare Part A     Authorization Time Period  02/05/2017 - 03/06/2017    PT Start Time  1433    PT Stop Time  1515    PT Time Calculation (min)  42 min    Activity Tolerance  Patient tolerated treatment well;No increased pain    Behavior During Therapy  WFL for tasks assessed/performed       Past Medical History:  Diagnosis Date  . Anemia   . Arthritis   . BPH (benign prostatic hyperplasia)   . Chronic back pain   . Difficulty walking   . DVT (deep venous thrombosis) ([place])    Right popliteal DVT December 2017  . End-stage renal disease on hemodialysis ([place])    Dr. Lowanda Foster  . Erectile dysfunction   . Essential hypertension   . Gout   . Neuropathy, diabetic ([place])   . Type 2 diabetes mellitus ([place])   . Venous (peripheral) insufficiency     Past Surgical History:  Procedure Laterality Date  . AV FISTULA PLACEMENT Left 03/09/2014   Procedure: INSERTION OF ARTERIOVENOUS (AV) GORE-TEX GRAFT ARM;  Surgeon: Angelia Mould, MD;  Location: [facility] OR;  Service: Vascular;  Laterality: Left;  . AV FISTULA PLACEMENT Right 05/06/2016   Procedure: INSERTION OF ARTERIOVENOUS (AV) GORE-TEX GRAFT  Right ARM;  Surgeon: Elam Dutch, MD;  Location: [facility];  Service: Vascular;  Laterality: Right;  . CYST EXCISION Right    cyst removed on thumb 2001  . INSERTION OF DIALYSIS CATHETER    . INSERTION OF DIALYSIS CATHETER Left 03/09/2014   Procedure: INSERTION OF DIALYSIS CATHETER;  Surgeon: Angelia Mould, MD;  Location: [facility];   Service: Vascular;  Laterality: Left;  . IR THROMBECTOMY AV FISTULA W/THROMBOLYSIS/PTA INC/SHUNT/IMG RIGHT Right 11/01/2016  . IR US GUIDE VASC ACCESS RIGHT  11/01/2016  . IRRIGATION AND DEBRIDEMENT ABSCESS N/A 02/25/2016   Procedure: IRRIGATION AND DEBRIDEMENT SCROTAL ABSCESS;  Surgeon: Nickie Retort, MD;  Location: [facility];  Service: Urology;  Laterality: N/A;    There were no vitals filed for this visit.  Subjective Assessment - 02/19/17 1439    Subjective  Patient states he is doing all right today. He states he is trying to walk tall with good posture. He reports he cannot perform his bridges on his bed at home because it is too soft. He states he is doing his HEP about every other day and that he can't do them before dialysis because he needs to be there at 5 AM and then he is too tired after.    Pertinent History  Bilateral neuropathy, Rhematoid arthritis and end stage renal disease    Limitations  House hold activities    Patient Stated Goals  To improve posture during ambulation and strength, want to stand up 30-40 minutes    Currently in Pain?  Yes    Pain Score  -- neuropathy pain in feet    Pain Location  Foot  Pain Orientation  Right;Left    Pain Descriptors / Indicators  Burning    Pain Type  Chronic pain    Pain Onset  More than a month ago    Pain Frequency  Constant    Multiple Pain Sites  No         [facility] Adult PT Treatment/Exercise - 02/19/17 0001      Bed Mobility   Bed Mobility  Supine to Sit;Sit to Supine    Supine to Sit  5: Supervision    Supine to Sit Details (indicate cue type and reason)  4 repetitions; verbal cues for sequencing    Sit to Supine  5: Supervision    Sit to Supine - Details (indicate cue type and reason)  4 repetitions; verbal cues for sequencing      Knee/Hip Exercises: Aerobic   Nustep  5 minutes on level 3, LE only for muscular endurance strengthening      Knee/Hip Exercises: Standing   Other Standing Knee Exercises  wall walk  with lift off for postural strengthening, 1x 10 reps, 3 second holds      Knee/Hip Exercises: Seated   Long Arc Quad  Strengthening;Both;2 sets;10 reps;Weights    Long Arc Quad Weight  4 lbs.    Sit to Sand  1 set;10 reps;without UE support cues for forward trunk lean      Knee/Hip Exercises: Supine   Bridges  AROM;Strengthening;Both;1 set;20 reps;Limitations    Bridges Limitations  3 second holds      Knee/Hip Exercises: Sidelying   Clams  BLE, with red tehraband, 1x 15 BLE        PT Education - 02/19/17 1447    Education provided  Yes    Education Details  Educated on form/technique throughout session. encouraged and educated on importance of contacting nutritionist for dietary recommendations regarding recent lab results for elevated phosphorus and high potassium.     Person(s) Educated  Patient    Methods  Explanation    Comprehension  Verbalized understanding;Need further instruction       PT Short Term Goals - 02/05/17 1018      PT SHORT TERM GOAL #1   Title  Patient will be independent with HEP to improve functional mobility and safety during ambulation    Baseline  02/05/17 - pateitn is participating in every other day, self report    Time  2    Period  Weeks    Status  Partially Met    Target Date  02/19/17      PT SHORT TERM GOAL #2   Title  Patient will present with at least 1/2 grade increase with MMT testing to improve strength for functional mobility in community with decreased fall risk.     Baseline  02/05/17 - improved by 1 grade for gorups tested     Time  2    Period  Weeks    Status  Achieved      PT SHORT TERM GOAL #3   Title  Patient will complete 3MWT ambulation of 530 feet to promote improved community ambulation safety.     Baseline  02/05/17 - 390 feet with no device, trunk flexion increaed as patient fatigued    Time  2    Period  Weeks    Status  On-going        PT Long Term Goals - 02/05/17 1021      PT LONG TERM GOAL #1   Title  Patient  will present with at least 1 grade increase with MMT testing to improve strength for functional mobility in community with decreased fall risk.     Baseline  02/05/17 - all groups tested improved by 1 grade     Time  4    Period  Weeks    Status  Achieved    Target Date  03/05/17      PT LONG TERM GOAL #2   Title  Patient will complete 3MWT ambulation of > 650 feet to promote improved community ambulation safety.    Baseline  02/05/17 - 390 feet with no device, trunk flexion increaed as patient fatigued    Time  4    Period  Weeks    Status  On-going      PT LONG TERM GOAL #3   Title  Patient will complete TUG functional mobility testing in < 9 seconds to indicate improve gait speed increase functional independence with mobility.     Baseline  02/05/17 - 9.7 seconds, patient appeared unsteady throughout test    Time  4    Period  Weeks    Status  On-going      PT LONG TERM GOAL #4   Title  Patient will complete 5 time STS testing in < 13 seconds indicating improved functional LE strength and increased endurance without use of UE.     Baseline  02/05/17 - 12.4 seconds, patient performed without UE use, continues to use body/momentum to initiate standing    Time  4    Period  Weeks    Status  Achieved      PT LONG TERM GOAL #5   Title  Pt to be able to SLS for 12 seconds or greater to reduce risk of falling     Baseline  02/05/17 - RLE: 5-7 seconds; LLE: 4-5 seconds    Time  4    Period  Weeks    Status  On-going        Plan - 02/19/17 1437    Clinical Impression Statement  Patient is progressing slowly in therapy and continues to require reminders to participate in HEP. He continues to demonstrate trunk flexion throughout exercises and ambulation and requires verbal cues to correct. He remains limited but lower extremity weakness and poor endurance with postural muscles. He remains challenged by functional exercises and requires frequent rest breaks throughout session. Patient asked  repeated questions about nutrition and lab results for electrolyte levels throughout session. I encouraged him to contact his nutritionist to discuss an appropriate nutrition plan. He will continue to benefit from skilled PT to address ongoing impairments and progress towards remaining goals to improve independence with mobility and overall QOL.    Clinical Impairments Affecting Rehab Potential  PMHx, chronicity of symptoms     PT Frequency  2x / week    PT Duration  4 weeks    PT Treatment/Interventions  ADLs/Self Care Home Management;Gait training;Stair training;Functional mobility training;Therapeutic exercise;Therapeutic activities;Manual techniques;Patient/family education;Neuromuscular re-education;Balance training;Energy conservation;DME Instruction    PT Next Visit Plan  Continue with functional LE strength, endurance and balance.  Encouarge participation with HEP and participation at local fitness facility.     PT Home Exercise Plan  Eval: sit to stand, seated hamstring stretch, supine SLR, bridge, prone hip extension; UPDATED 02/10/17: BRIDGE, BAND CLAM, REVERSE CURL UP, LAQ, STS    Consulted and Agree with Plan of Care  Patient       Patient will benefit from skilled therapeutic  intervention in order to improve the following deficits and impairments:  Abnormal gait, Decreased endurance, Cardiopulmonary status limiting activity, Decreased activity tolerance, Decreased balance, Decreased mobility, Difficulty walking, Decreased strength, Obesity, Pain, Improper body mechanics, Postural dysfunction, Impaired flexibility  Visit Diagnosis: Abnormality of gait due to impairment of balance  Muscle weakness (generalized)  Impaired functional mobility, balance, and endurance     Problem List Patient Active Problem List   Diagnosis Date Noted  . Onychomycosis 06/17/2016  . Constipation 03/05/2016  . Anemia in chronic kidney disease (CKD) 03/05/2016  . Bleeding from wound 03/03/2016  .  Chronic anticoagulation 03/03/2016  . Scrotal abscess 02/25/2016  . Type 2 diabetes mellitus ([place]) 02/25/2016  . Acute venous embolism and thrombosis of deep vessels of proximal lower extremity ([place]) [I82.4Y9] 02/11/2016  . Encounter for therapeutic drug monitoring 02/11/2016  . Nausea & vomiting 11/17/2014  . Thrombocytopenia ([place]) 02/21/2014  . Morbid obesity ([place]) 03/30/2013  . RBBB 03/29/2013  . Hyperkalemia 03/28/2013  . Leukocytosis 03/28/2013  . Nausea vomiting and diarrhea 03/28/2013  . BBB (bundle branch block) 03/28/2013  . Hypertension   . ESRD on dialysis ([place])   . Lack of coordination 04/15/2012  . Muscle weakness (generalized) 04/15/2012  . Arthritis, gouty 04/15/2012  . Difficulty in walking(719.7) 08/18/2011  . Weakness of both legs 08/18/2011    Kipp Brood, PT, DPT Physical Therapist with [facility]  02/19/2017 4:16 PM    [place], [place], [address] Phone: [phone]   Fax:  [phone]  Name: Jack Irwin MRN: [id number] Date of Birth: Feb 06, 1965

## 2017-02-20 DIAGNOSIS — D509 Iron deficiency anemia, unspecified: Secondary | ICD-10-CM | POA: Diagnosis not present

## 2017-02-20 DIAGNOSIS — N2581 Secondary hyperparathyroidism of renal origin: Secondary | ICD-10-CM | POA: Diagnosis not present

## 2017-02-20 DIAGNOSIS — Z992 Dependence on renal dialysis: Secondary | ICD-10-CM | POA: Diagnosis not present

## 2017-02-20 DIAGNOSIS — N186 End stage renal disease: Secondary | ICD-10-CM | POA: Diagnosis not present

## 2017-02-23 DIAGNOSIS — Z992 Dependence on renal dialysis: Secondary | ICD-10-CM | POA: Diagnosis not present

## 2017-02-23 DIAGNOSIS — N2581 Secondary hyperparathyroidism of renal origin: Secondary | ICD-10-CM | POA: Diagnosis not present

## 2017-02-23 DIAGNOSIS — D509 Iron deficiency anemia, unspecified: Secondary | ICD-10-CM | POA: Diagnosis not present

## 2017-02-23 DIAGNOSIS — N186 End stage renal disease: Secondary | ICD-10-CM | POA: Diagnosis not present

## 2017-02-24 ENCOUNTER — Ambulatory Visit (HOSPITAL_COMMUNITY): Payer: Medicare Other

## 2017-02-24 ENCOUNTER — Telehealth (HOSPITAL_COMMUNITY): Payer: Self-pay

## 2017-02-24 NOTE — Telephone Encounter (Signed)
Pt called states he is having chest pain and his arm is hurting -he will go to the ED now with his wife.

## 2017-02-24 NOTE — Telephone Encounter (Signed)
Pt called to confrim apptment time.NF 02/24/17

## 2017-02-25 ENCOUNTER — Telehealth (HOSPITAL_COMMUNITY): Payer: Self-pay | Admitting: Family Medicine

## 2017-02-25 NOTE — Telephone Encounter (Signed)
1/22/pt called to cx says he will going to dialysis and can't come here19

## 2017-02-26 ENCOUNTER — Ambulatory Visit (HOSPITAL_COMMUNITY): Payer: Medicare Other

## 2017-02-26 ENCOUNTER — Ambulatory Visit: Payer: Self-pay | Admitting: Family Medicine

## 2017-02-26 DIAGNOSIS — N2581 Secondary hyperparathyroidism of renal origin: Secondary | ICD-10-CM | POA: Diagnosis not present

## 2017-02-26 DIAGNOSIS — T82858A Stenosis of vascular prosthetic devices, implants and grafts, initial encounter: Secondary | ICD-10-CM | POA: Diagnosis not present

## 2017-02-26 DIAGNOSIS — T82868A Thrombosis of vascular prosthetic devices, implants and grafts, initial encounter: Secondary | ICD-10-CM | POA: Diagnosis not present

## 2017-02-26 DIAGNOSIS — D509 Iron deficiency anemia, unspecified: Secondary | ICD-10-CM | POA: Diagnosis not present

## 2017-02-26 DIAGNOSIS — N186 End stage renal disease: Secondary | ICD-10-CM | POA: Diagnosis not present

## 2017-02-26 DIAGNOSIS — Z992 Dependence on renal dialysis: Secondary | ICD-10-CM | POA: Diagnosis not present

## 2017-02-27 DIAGNOSIS — Z992 Dependence on renal dialysis: Secondary | ICD-10-CM | POA: Diagnosis not present

## 2017-02-27 DIAGNOSIS — N186 End stage renal disease: Secondary | ICD-10-CM | POA: Diagnosis not present

## 2017-02-27 DIAGNOSIS — N2581 Secondary hyperparathyroidism of renal origin: Secondary | ICD-10-CM | POA: Diagnosis not present

## 2017-02-27 DIAGNOSIS — D509 Iron deficiency anemia, unspecified: Secondary | ICD-10-CM | POA: Diagnosis not present

## 2017-03-02 DIAGNOSIS — Z992 Dependence on renal dialysis: Secondary | ICD-10-CM | POA: Diagnosis not present

## 2017-03-02 DIAGNOSIS — N2581 Secondary hyperparathyroidism of renal origin: Secondary | ICD-10-CM | POA: Diagnosis not present

## 2017-03-02 DIAGNOSIS — D509 Iron deficiency anemia, unspecified: Secondary | ICD-10-CM | POA: Diagnosis not present

## 2017-03-02 DIAGNOSIS — N186 End stage renal disease: Secondary | ICD-10-CM | POA: Diagnosis not present

## 2017-03-03 ENCOUNTER — Ambulatory Visit (HOSPITAL_COMMUNITY): Payer: Medicare Other

## 2017-03-03 ENCOUNTER — Other Ambulatory Visit: Payer: Self-pay

## 2017-03-03 ENCOUNTER — Encounter (HOSPITAL_COMMUNITY): Payer: Self-pay

## 2017-03-03 DIAGNOSIS — R2689 Other abnormalities of gait and mobility: Secondary | ICD-10-CM

## 2017-03-03 DIAGNOSIS — Z7409 Other reduced mobility: Secondary | ICD-10-CM | POA: Diagnosis not present

## 2017-03-03 DIAGNOSIS — M6281 Muscle weakness (generalized): Secondary | ICD-10-CM | POA: Diagnosis not present

## 2017-03-03 NOTE — Therapy (Signed)
[place], [place], [address] Phone: [phone]   Fax:  [phone]  Physical Therapy Treatment/Re-assessment  Patient Details  Name: Jack Irwin MRN: [id number] Date of Birth: 15-Jul-1965 Referring Provider: Dorena Dew, FNP   Encounter Date: 03/03/2017  PT End of Session - 03/03/17 1448    Visit Number  4    Number of Visits  9    Date for PT Re-Evaluation  02/26/17    Authorization Type  Medicare Part A     Authorization Time Period  02/05/2017 - 03/06/2017    PT Start Time  1441 patient late    PT Stop Time  1515    PT Time Calculation (min)  34 min    Equipment Utilized During Treatment  Gait belt    Activity Tolerance  Patient tolerated treatment well;No increased pain    Behavior During Therapy  WFL for tasks assessed/performed       Past Medical History:  Diagnosis Date  . Anemia   . Arthritis   . BPH (benign prostatic hyperplasia)   . Chronic back pain   . Difficulty walking   . DVT (deep venous thrombosis) ([place])    Right popliteal DVT December 2017  . End-stage renal disease on hemodialysis ([place])    Dr. Lowanda Foster  . Erectile dysfunction   . Essential hypertension   . Gout   . Neuropathy, diabetic ([place])   . Type 2 diabetes mellitus ([place])   . Venous (peripheral) insufficiency     Past Surgical History:  Procedure Laterality Date  . AV FISTULA PLACEMENT Left 03/09/2014   Procedure: INSERTION OF ARTERIOVENOUS (AV) GORE-TEX GRAFT ARM;  Surgeon: Angelia Mould, MD;  Location: [facility] OR;  Service: Vascular;  Laterality: Left;  . AV FISTULA PLACEMENT Right 05/06/2016   Procedure: INSERTION OF ARTERIOVENOUS (AV) GORE-TEX GRAFT  Right ARM;  Surgeon: Elam Dutch, MD;  Location: [facility];  Service: Vascular;  Laterality: Right;  . CYST EXCISION Right    cyst removed on thumb 2001  . INSERTION OF DIALYSIS CATHETER    . INSERTION OF DIALYSIS CATHETER Left 03/09/2014   Procedure: INSERTION OF  DIALYSIS CATHETER;  Surgeon: Angelia Mould, MD;  Location: [facility];  Service: Vascular;  Laterality: Left;  . IR THROMBECTOMY AV FISTULA W/THROMBOLYSIS/PTA INC/SHUNT/IMG RIGHT Right 11/01/2016  . IR US GUIDE VASC ACCESS RIGHT  11/01/2016  . IRRIGATION AND DEBRIDEMENT ABSCESS N/A 02/25/2016   Procedure: IRRIGATION AND DEBRIDEMENT SCROTAL ABSCESS;  Surgeon: Nickie Retort, MD;  Location: [facility];  Service: Urology;  Laterality: N/A;    There were no vitals filed for this visit.  Subjective Assessment - 03/03/17 1449    Subjective  --    Pertinent History  Bilateral neuropathy, Rhematoid arthritis and end stage renal disease    Limitations  House hold activities    Patient Stated Goals  To improve posture during ambulation and strength, want to stand up 30-40 minutes    Currently in Pain?  No/denies    Pain Onset  --         [facility] PT Assessment - 03/03/17 0001      Assessment   Medical Diagnosis  Neuropathy, weakness    Referring Provider  Dorena Dew, FNP    Next MD Visit  unsure    Prior Therapy  yes      Precautions   Precautions  None      Restrictions   Weight  Bearing Restrictions  No      Balance Screen   Has the patient fallen in the past 6 months  No    Has the patient had a decrease in activity level because of a fear of falling?   No    Is the patient reluctant to leave their home because of a fear of falling?   No      Cognition   Behaviors  Restless;Lability      Observation/Other Assessments   Focus on Therapeutic Outcomes (FOTO)   -- not taken at intake      Functional Tests   Functional tests  Single leg stance      Single Leg Stance   Comments  Bil LE: less than 5 seconds      Posture/Postural Control   Postural Limitations  Rounded Shoulders;Forward head;Flexed trunk      Ambulation/Gait   Ambulation/Gait  Yes    Ambulation Distance (Feet)  532 Feet    Assistive device  None    Gait Pattern  Step-through pattern;Decreased stride  length;Decreased hip/knee flexion - right;Decreased hip/knee flexion - left;Trunk flexed;Trendelenburg;Wide base of support    Ambulation Surface  Level        PT Education - 03/03/17 1524    Education provided  Yes    Education Details  Extensive time spent educating patient about purpose of Physical Therapy and appropriate transition /importance of patient becoming independent when he has met his goals. Reviewed patient's missed appointments and discussed lack of compliance with participation in therapy and the need for him to demonstrate greater independence to progress further. Discussed importance of compliance with participation in HEP on daily basis and on importance of independence with joining local fitness facility. Patient provided additional handout for [facility] membership and encouraged to join walking program. Discussed final therapy session being this Thursday for patient to graduate from PT to go over a new comprehensive HEP. Patient agreeable to this by end of session.    Person(s) Educated  Patient    Methods  Explanation;Handout    Comprehension  Verbalized understanding       PT Short Term Goals - 03/03/17 1448      PT SHORT TERM GOAL #1   Title  Patient will be independent with HEP to improve functional mobility and safety during ambulation    Baseline  02/05/17 - pateint is participating in every other day, self report; 03/03/17 - pateint continues to report limited participation to every other day at most    Time  2    Period  Weeks    Status  Partially Met      PT SHORT TERM GOAL #2   Title  Patient will present with at least 1/2 grade increase with MMT testing to improve strength for functional mobility in community with decreased fall risk.     Baseline  02/05/17 - improved by 1 grade for gorups tested     Time  2    Period  Weeks    Status  Achieved      PT SHORT TERM GOAL #3   Title  Patient will complete 3MWT ambulation of 530 feet to promote improved community  ambulation safety.     Baseline  02/05/17 - 390 feet with no device, trunk flexion increaed as patient fatigued; 03/03/17 - pateint ambulated 532 feet today, however with very unsteady gait pattern    Time  2    Period  Weeks    Status  Partially  Met        PT Long Term Goals - 03/03/17 1449      PT LONG TERM GOAL #1   Title  Patient will present with at least 1 grade increase with MMT testing to improve strength for functional mobility in community with decreased fall risk.     Baseline  02/05/17 - all groups tested improved by 1 grade     Time  4    Period  Weeks    Status  Achieved      PT LONG TERM GOAL #2   Title  Patient will complete 3MWT ambulation of > 650 feet to promote improved community ambulation safety.    Baseline  02/05/17 - 390 feet with no device, trunk flexion increaed as patient fatigued; 03/03/17 - 532 feet, unsteagdy gait    Time  4    Period  Weeks    Status  On-going      PT LONG TERM GOAL #3   Title  Patient will complete TUG functional mobility testing in < 9 seconds to indicate improve gait speed increase functional independence with mobility.     Baseline  02/05/17 - 9.7 seconds, patient appeared unsteady throughout test    Time  4    Period  Weeks    Status  On-going      PT LONG TERM GOAL #4   Title  Patient will complete 5 time STS testing in < 13 seconds indicating improved functional LE strength and increased endurance without use of UE.     Baseline  02/05/17 - 12.4 seconds, patient performed without UE use, continues to use body/momentum to initiate standing    Time  4    Period  Weeks    Status  Achieved      PT LONG TERM GOAL #5   Title  Pt to be able to SLS for 12 seconds or greater to reduce risk of falling     Baseline  02/05/17 - RLE: 5-7 seconds; LLE: 4-5 seconds; 03/03/17 - 5 secodns or less bilaterally    Time  4    Period  Weeks    Status  On-going        Plan - 03/03/17 1448    Clinical Impression Statement  Re-assessment performed  today and patient has met/partially met 3/3 short term goals and 2/5 long term goals. He has made limited progress in last 4 weeks and has continued to have low compliance with participation in HEP and attendance of therapy sessions. Patient continues to be limited during ambulation by excessive trunk flexion throughout and requires verbal cues to correct. I discussed slow progress made with patient and educated him on largest limitation to therapy being decreased compliance with attendance. I educated patient that final step for therapy is to perform exercises with HEP and to initiate independent walking program/exercise program at fitness facility. He agreed that he needs to participate in regular fitness program and after lengthy discussion agreed that he needs to attempt independence. I encouraged him that graduating from PT is a positive thing and an achievement that he has met many of his goals. Patient will benefit from additional PT session to review HEP and educate him on safe independence with fitness program participation in preparation for discharge from therapy next session.     Clinical Impairments Affecting Rehab Potential  PMHx, chronicity of symptoms     PT Frequency  2x / week    PT Duration  4 weeks  PT Treatment/Interventions  ADLs/Self Care Home Management;Gait training;Stair training;Functional mobility training;Therapeutic exercise;Therapeutic activities;Manual techniques;Patient/family education;Neuromuscular re-education;Balance training;Energy conservation;DME Instruction    PT Next Visit Plan  Patient will discharge on 03/05/17 and this session should focus on reviewing HEP and updating for optimal exercises. Also review safe initiation of gym/fitness facilty program for patient to become more independent. Encourage patient and re-inforce taht graduating therapy is a positive accomplishment. Continue with functional LE strength, endurance and balance.  Encouarge participation with  HEP and participation at local fitness facility.     PT Home Exercise Plan  Eval: sit to stand, seated hamstring stretch, supine SLR, bridge, prone hip extension; UPDATED 02/10/17: BRIDGE, BAND CLAM, REVERSE CURL UP, LAQ, STS    Consulted and Agree with Plan of Care  Patient       Patient will benefit from skilled therapeutic intervention in order to improve the following deficits and impairments:  Abnormal gait, Decreased endurance, Cardiopulmonary status limiting activity, Decreased activity tolerance, Decreased balance, Decreased mobility, Difficulty walking, Decreased strength, Obesity, Pain, Improper body mechanics, Postural dysfunction, Impaired flexibility  Visit Diagnosis: Abnormality of gait due to impairment of balance  Muscle weakness (generalized)  Impaired functional mobility, balance, and endurance     Problem List Patient Active Problem List   Diagnosis Date Noted  . Onychomycosis 06/17/2016  . Constipation 03/05/2016  . Anemia in chronic kidney disease (CKD) 03/05/2016  . Bleeding from wound 03/03/2016  . Chronic anticoagulation 03/03/2016  . Scrotal abscess 02/25/2016  . Type 2 diabetes mellitus ([place]) 02/25/2016  . Acute venous embolism and thrombosis of deep vessels of proximal lower extremity ([place]) [I82.4Y9] 02/11/2016  . Encounter for therapeutic drug monitoring 02/11/2016  . Nausea & vomiting 11/17/2014  . Thrombocytopenia ([place]) 02/21/2014  . Morbid obesity ([place]) 03/30/2013  . RBBB 03/29/2013  . Hyperkalemia 03/28/2013  . Leukocytosis 03/28/2013  . Nausea vomiting and diarrhea 03/28/2013  . BBB (bundle branch block) 03/28/2013  . Hypertension   . ESRD on dialysis ([place])   . Lack of coordination 04/15/2012  . Muscle weakness (generalized) 04/15/2012  . Arthritis, gouty 04/15/2012  . Difficulty in walking(719.7) 08/18/2011  . Weakness of both legs 08/18/2011    Kipp Brood, PT, DPT Physical Therapist with [facility]  03/03/2017 5:53 PM    [place], [place], [address] Phone: [phone]   Fax:  [phone]  Name: Jack Irwin MRN: [id number] Date of Birth: [birth year]

## 2017-03-03 NOTE — Patient Instructions (Addendum)
Sit to Stand: Phase 1    Sitting, squeeze pelvic floor and hold. Lean trunk forward. Repeat 10 times. Do 2 times a day.  Copyright  VHI. All rights reserved. Flexion and Extension - Supine    Raise straight uninvolved leg toward ceiling 10 times. Repeat with other leg and hip. Do 2 times per day.  Copyright  VHI. All rights reserved.  Hip Extension (Prone)    Lift left leg 8 inches from floor, keeping knee locked. Repeat 10 times per set. Do 2 sets per session. Do 2 sessions per day.  http://orth.exer.us/98   Copyright  VHI. All rights reserved.    SEATED HAMSTRING STRETCH 3x30 seconds While seated, rest your heel on the floor with your knee straight and gently lean forward until a stretch is felt behind your knee/thigh.Bridge    Lie back, legs bent. Inhale, pressing hips up. Keeping ribs in, lengthen lower back. Exhale, rolling down along spine from top. Repeat 10 times. Do 2 sessions per day.

## 2017-03-05 ENCOUNTER — Ambulatory Visit (HOSPITAL_COMMUNITY): Payer: Medicare Other

## 2017-03-05 ENCOUNTER — Ambulatory Visit: Payer: Self-pay | Admitting: Family Medicine

## 2017-03-05 ENCOUNTER — Telehealth (HOSPITAL_COMMUNITY): Payer: Self-pay | Admitting: Family Medicine

## 2017-03-05 DIAGNOSIS — N186 End stage renal disease: Secondary | ICD-10-CM | POA: Diagnosis not present

## 2017-03-05 DIAGNOSIS — Z992 Dependence on renal dialysis: Secondary | ICD-10-CM | POA: Diagnosis not present

## 2017-03-05 NOTE — Telephone Encounter (Signed)
03/05/17  pt called and said to cx today and we rescheduled for 2/5

## 2017-03-06 DIAGNOSIS — D509 Iron deficiency anemia, unspecified: Secondary | ICD-10-CM | POA: Diagnosis not present

## 2017-03-06 DIAGNOSIS — D631 Anemia in chronic kidney disease: Secondary | ICD-10-CM | POA: Diagnosis not present

## 2017-03-06 DIAGNOSIS — Z992 Dependence on renal dialysis: Secondary | ICD-10-CM | POA: Diagnosis not present

## 2017-03-06 DIAGNOSIS — N2581 Secondary hyperparathyroidism of renal origin: Secondary | ICD-10-CM | POA: Diagnosis not present

## 2017-03-06 DIAGNOSIS — N186 End stage renal disease: Secondary | ICD-10-CM | POA: Diagnosis not present

## 2017-03-09 DIAGNOSIS — D509 Iron deficiency anemia, unspecified: Secondary | ICD-10-CM | POA: Diagnosis not present

## 2017-03-09 DIAGNOSIS — N186 End stage renal disease: Secondary | ICD-10-CM | POA: Diagnosis not present

## 2017-03-09 DIAGNOSIS — Z992 Dependence on renal dialysis: Secondary | ICD-10-CM | POA: Diagnosis not present

## 2017-03-09 DIAGNOSIS — D631 Anemia in chronic kidney disease: Secondary | ICD-10-CM | POA: Diagnosis not present

## 2017-03-09 DIAGNOSIS — N2581 Secondary hyperparathyroidism of renal origin: Secondary | ICD-10-CM | POA: Diagnosis not present

## 2017-03-10 ENCOUNTER — Ambulatory Visit (HOSPITAL_COMMUNITY): Payer: Medicare Other | Admitting: Physical Therapy

## 2017-03-10 ENCOUNTER — Telehealth (HOSPITAL_COMMUNITY): Payer: Self-pay | Admitting: Family Medicine

## 2017-03-10 NOTE — Telephone Encounter (Signed)
03/10/17  Pt said he wouldn't be here today

## 2017-03-11 DIAGNOSIS — D509 Iron deficiency anemia, unspecified: Secondary | ICD-10-CM | POA: Diagnosis not present

## 2017-03-11 DIAGNOSIS — D631 Anemia in chronic kidney disease: Secondary | ICD-10-CM | POA: Diagnosis not present

## 2017-03-11 DIAGNOSIS — N2581 Secondary hyperparathyroidism of renal origin: Secondary | ICD-10-CM | POA: Diagnosis not present

## 2017-03-11 DIAGNOSIS — Z992 Dependence on renal dialysis: Secondary | ICD-10-CM | POA: Diagnosis not present

## 2017-03-11 DIAGNOSIS — N186 End stage renal disease: Secondary | ICD-10-CM | POA: Diagnosis not present

## 2017-03-12 ENCOUNTER — Ambulatory Visit (INDEPENDENT_AMBULATORY_CARE_PROVIDER_SITE_OTHER): Payer: Medicare Other | Admitting: Family Medicine

## 2017-03-12 ENCOUNTER — Encounter: Payer: Self-pay | Admitting: Family Medicine

## 2017-03-12 VITALS — BP 117/74 | HR 102 | Temp 98.2°F | Resp 16 | Ht 71.0 in | Wt 261.0 lb

## 2017-03-12 DIAGNOSIS — R079 Chest pain, unspecified: Secondary | ICD-10-CM

## 2017-03-12 DIAGNOSIS — R262 Difficulty in walking, not elsewhere classified: Secondary | ICD-10-CM

## 2017-03-12 DIAGNOSIS — M25562 Pain in left knee: Secondary | ICD-10-CM

## 2017-03-12 DIAGNOSIS — B351 Tinea unguium: Secondary | ICD-10-CM | POA: Diagnosis not present

## 2017-03-12 DIAGNOSIS — E118 Type 2 diabetes mellitus with unspecified complications: Secondary | ICD-10-CM

## 2017-03-12 DIAGNOSIS — Z7901 Long term (current) use of anticoagulants: Secondary | ICD-10-CM

## 2017-03-12 DIAGNOSIS — R21 Rash and other nonspecific skin eruption: Secondary | ICD-10-CM

## 2017-03-12 DIAGNOSIS — G8929 Other chronic pain: Secondary | ICD-10-CM

## 2017-03-12 DIAGNOSIS — M25561 Pain in right knee: Secondary | ICD-10-CM

## 2017-03-12 LAB — POCT GLYCOSYLATED HEMOGLOBIN (HGB A1C): Hemoglobin A1C: 5.1

## 2017-03-12 MED ORDER — DICLOFENAC SODIUM 1 % TD GEL: 4.0000 g | Freq: Four times a day (QID) | TRANSDERMAL | 5 refills | Status: DC

## 2017-03-12 MED ORDER — BACITRACIN 500 UNIT/GM EX OINT: 1.0000 "application " | TOPICAL_OINTMENT | Freq: Two times a day (BID) | CUTANEOUS | 0 refills | Status: AC

## 2017-03-12 NOTE — Patient Instructions (Signed)
Your hemoglobin A1c is 5.1, your type 2 diabetes mellitus has resolved.  You do not warrant any medications.  Continue a low-fat, low carbohydrate diet. Continue to follow-up with dialysis as scheduled. For localize skin eruption to left breast wash with Dial antibacterial soap twice daily.  And apply bacitracin ointment twice daily for 7 days.  For toenail fungus, will send a referral to podiatry for further workup and evaluation. I will call physical therapy to schedule appointment.

## 2017-03-12 NOTE — Progress Notes (Signed)
[facility]  Subjective:    Patient ID: Jack Irwin, male    DOB: August 03, 1965, 51 y.o.   MRN: [id number]  HPI Jack Irwin, a 52 year old male with a history of rheumatoid arthritis, type 2 diabetes mellitus, and  end stage renal disease presents for a follow up of chronic conditions.  Patient is complaining of chest discomfort over the past several days. Patient describes pain as intermittent and sharp. Pain in not radiating. Pain is characterized as pressure like. Current pain intensity is 2-3/10.  Associated symptoms are chest pressure/discomfort. Patient's cardiac risk factors are obesity (BMI >= 30 kg/m2) and sedentary lifestyle. Patient is on chronic anticoagulation therapy. Previous cardiac tests include EKG.   Patient is complaining foot pain over the past several months. Patient was previously under the care of podiatry, but has been lost to follow up. Patient states that toenails are thick and painful. He says that foot pain is worsened by applying shoes. Current pain intensity is 5/10.   Patient had a history of type 2 DM. Patient is following a renal diet per nephrologist. He does not exercise routinely. He is not taking antidiabetic medications. Patient denies headache, dizziness, polydipsia, or polyphagia.   Past Medical History:  Diagnosis Date  . Anemia   . Arthritis   . BPH (benign prostatic hyperplasia)   . Chronic back pain   . Difficulty walking   . DVT (deep venous thrombosis) ([place])    Right popliteal DVT December 2017  . End-stage renal disease on hemodialysis ([place])    Dr. Lowanda Foster  . Erectile dysfunction   . Essential hypertension   . Gout   . Neuropathy, diabetic ([place])   . Type 2 diabetes mellitus ([place])   . Venous (peripheral) insufficiency    Review of Systems  HENT: Positive for dental problem (dental extraction 1 week ago).   Cardiovascular: Positive for chest pain.  Gastrointestinal: Negative.   Endocrine: Negative for polydipsia,  polyphagia and polyuria.  Genitourinary: Negative.   Musculoskeletal: Positive for gait problem and myalgias.  Skin: Negative.   Neurological: Positive for weakness and numbness (lower extremities). Negative for dizziness and speech difficulty.       Abnormal gait  Hematological: Negative.   Psychiatric/Behavioral: Negative.        Objective:   Physical Exam  Constitutional: He appears well-developed.  HENT:  Head: Normocephalic and atraumatic.  Right Ear: External ear normal.  Left Ear: External ear normal.  Nose: Nose normal.  Mouth/Throat: Oropharynx is clear and moist. Abnormal dentition. Dental caries present.  Swelling and erythema to right upper gumline  Eyes: Pupils are equal, round, and reactive to light.  Neck: Normal range of motion.  Cardiovascular: Normal rate, regular rhythm, normal heart sounds and intact distal pulses.  Pulmonary/Chest: Effort normal and breath sounds normal.  Abdominal: Soft. Bowel sounds are normal.  Increased abdominal girth, pendulous.   Musculoskeletal:  3/5 weakness to upper extremities   Neurological: No cranial nerve deficit. He exhibits abnormal muscle tone. Coordination and gait abnormal.  Skin:  Dry, ashen  Toenail thickening, discoloration, peeling  Skin eruption to right left breast- 1 cm, no draining, tender to touch, dry     BP 117/74 (BP Location: Left Arm, Patient Position: Sitting, Cuff Size: Large)   Pulse (!) 102   Temp 98.2 F (36.8 C) (Oral)   Resp 16   Ht 5\' 11"  (1.803 m)   Wt 261 lb (118.4 kg)   SpO2 97%   BMI 36.40  kg/m  Assessment & Plan:  1. Chest pain, unspecified type Reviewed EKG, no changes from previous.   - EKG 12-Lead  2. Difficulty walking Continue scheduled physical therapy sessions.   3. Onychomycosis of toenail - Ambulatory referral to Podiatry  4. Chronic anticoagulation Continue Eliquis 2.5 mg BID. No signs of bleeding. Will continue to monitor serum creatinine.   5. Type 2 diabetes  mellitus with complication, unspecified whether long term insulin use (HCC) Type 2 diabetes has resolved, current hemoglobin a1c is 5.1 without medications. Continue renal diet per nephrology.   6. Localized skin eruption - bacitracin 500 UNIT/GM ointment; Apply 1 application topically 2 (two) times daily for 7 days.  Dispense: 15 g; Refill: 0  7. Chronic pain of both knees - diclofenac sodium (VOLTAREN) 1 % GEL; Apply 4 g topically 4 (four) times daily.  Dispense: 100 g; Refill: 5    RTC: 6 months for chronic conditions. Will check serum creatinine level at that time.    Jack Pounds  MSN, FNP-C Patient [facility] [address] [place], [place] [address] [phone]

## 2017-03-13 DIAGNOSIS — Z992 Dependence on renal dialysis: Secondary | ICD-10-CM | POA: Diagnosis not present

## 2017-03-13 DIAGNOSIS — N2581 Secondary hyperparathyroidism of renal origin: Secondary | ICD-10-CM | POA: Diagnosis not present

## 2017-03-13 DIAGNOSIS — D631 Anemia in chronic kidney disease: Secondary | ICD-10-CM | POA: Diagnosis not present

## 2017-03-13 DIAGNOSIS — D509 Iron deficiency anemia, unspecified: Secondary | ICD-10-CM | POA: Diagnosis not present

## 2017-03-13 DIAGNOSIS — N186 End stage renal disease: Secondary | ICD-10-CM | POA: Diagnosis not present

## 2017-03-18 DIAGNOSIS — Z1159 Encounter for screening for other viral diseases: Secondary | ICD-10-CM | POA: Diagnosis not present

## 2017-03-18 DIAGNOSIS — D631 Anemia in chronic kidney disease: Secondary | ICD-10-CM | POA: Diagnosis not present

## 2017-03-18 DIAGNOSIS — N186 End stage renal disease: Secondary | ICD-10-CM | POA: Diagnosis not present

## 2017-03-18 DIAGNOSIS — D509 Iron deficiency anemia, unspecified: Secondary | ICD-10-CM | POA: Diagnosis not present

## 2017-03-18 DIAGNOSIS — N2581 Secondary hyperparathyroidism of renal origin: Secondary | ICD-10-CM | POA: Diagnosis not present

## 2017-03-18 DIAGNOSIS — Z992 Dependence on renal dialysis: Secondary | ICD-10-CM | POA: Diagnosis not present

## 2017-03-20 DIAGNOSIS — N186 End stage renal disease: Secondary | ICD-10-CM | POA: Diagnosis not present

## 2017-03-20 DIAGNOSIS — D631 Anemia in chronic kidney disease: Secondary | ICD-10-CM | POA: Diagnosis not present

## 2017-03-20 DIAGNOSIS — D509 Iron deficiency anemia, unspecified: Secondary | ICD-10-CM | POA: Diagnosis not present

## 2017-03-20 DIAGNOSIS — N2581 Secondary hyperparathyroidism of renal origin: Secondary | ICD-10-CM | POA: Diagnosis not present

## 2017-03-20 DIAGNOSIS — Z992 Dependence on renal dialysis: Secondary | ICD-10-CM | POA: Diagnosis not present

## 2017-03-23 DIAGNOSIS — D631 Anemia in chronic kidney disease: Secondary | ICD-10-CM | POA: Diagnosis not present

## 2017-03-23 DIAGNOSIS — D509 Iron deficiency anemia, unspecified: Secondary | ICD-10-CM | POA: Diagnosis not present

## 2017-03-23 DIAGNOSIS — N2581 Secondary hyperparathyroidism of renal origin: Secondary | ICD-10-CM | POA: Diagnosis not present

## 2017-03-23 DIAGNOSIS — Z992 Dependence on renal dialysis: Secondary | ICD-10-CM | POA: Diagnosis not present

## 2017-03-23 DIAGNOSIS — N186 End stage renal disease: Secondary | ICD-10-CM | POA: Diagnosis not present

## 2017-03-25 ENCOUNTER — Ambulatory Visit: Payer: Self-pay | Admitting: Urology

## 2017-03-25 DIAGNOSIS — N186 End stage renal disease: Secondary | ICD-10-CM | POA: Diagnosis not present

## 2017-03-25 DIAGNOSIS — N2581 Secondary hyperparathyroidism of renal origin: Secondary | ICD-10-CM | POA: Diagnosis not present

## 2017-03-25 DIAGNOSIS — Z992 Dependence on renal dialysis: Secondary | ICD-10-CM | POA: Diagnosis not present

## 2017-03-25 DIAGNOSIS — D631 Anemia in chronic kidney disease: Secondary | ICD-10-CM | POA: Diagnosis not present

## 2017-03-25 DIAGNOSIS — D509 Iron deficiency anemia, unspecified: Secondary | ICD-10-CM | POA: Diagnosis not present

## 2017-03-27 DIAGNOSIS — N2581 Secondary hyperparathyroidism of renal origin: Secondary | ICD-10-CM | POA: Diagnosis not present

## 2017-03-27 DIAGNOSIS — N186 End stage renal disease: Secondary | ICD-10-CM | POA: Diagnosis not present

## 2017-03-27 DIAGNOSIS — Z992 Dependence on renal dialysis: Secondary | ICD-10-CM | POA: Diagnosis not present

## 2017-03-27 DIAGNOSIS — D631 Anemia in chronic kidney disease: Secondary | ICD-10-CM | POA: Diagnosis not present

## 2017-03-27 DIAGNOSIS — D509 Iron deficiency anemia, unspecified: Secondary | ICD-10-CM | POA: Diagnosis not present

## 2017-03-30 ENCOUNTER — Other Ambulatory Visit: Payer: Self-pay | Admitting: Family Medicine

## 2017-03-30 DIAGNOSIS — D631 Anemia in chronic kidney disease: Secondary | ICD-10-CM | POA: Diagnosis not present

## 2017-03-30 DIAGNOSIS — N2581 Secondary hyperparathyroidism of renal origin: Secondary | ICD-10-CM | POA: Diagnosis not present

## 2017-03-30 DIAGNOSIS — Z992 Dependence on renal dialysis: Secondary | ICD-10-CM | POA: Diagnosis not present

## 2017-03-30 DIAGNOSIS — D509 Iron deficiency anemia, unspecified: Secondary | ICD-10-CM | POA: Diagnosis not present

## 2017-03-30 DIAGNOSIS — N186 End stage renal disease: Secondary | ICD-10-CM | POA: Diagnosis not present

## 2017-03-30 MED ORDER — MENTHOL (TOPICAL ANALGESIC) 4 % EX GEL: 1.0000 | Freq: Three times a day (TID) | CUTANEOUS | 2 refills | Status: DC | PRN

## 2017-03-30 NOTE — Progress Notes (Signed)
Meds ordered this encounter  Medications  . Menthol, Topical Analgesic, (BIOFREEZE) 4 % GEL    Sig: Apply 1 each topically 3 (three) times daily as needed.    Dispense:  1 Tube    Refill:  [place]  MSN, FNP-C Patient [facility] [address] [place], [place] [address] [phone]

## 2017-04-01 ENCOUNTER — Encounter: Payer: Self-pay | Admitting: Family Medicine

## 2017-04-01 ENCOUNTER — Ambulatory Visit (INDEPENDENT_AMBULATORY_CARE_PROVIDER_SITE_OTHER): Payer: Medicare Other | Admitting: Family Medicine

## 2017-04-01 VITALS — BP 136/82 | HR 88 | Temp 97.8°F | Resp 16 | Ht 71.0 in | Wt 265.0 lb

## 2017-04-01 DIAGNOSIS — R109 Unspecified abdominal pain: Secondary | ICD-10-CM | POA: Diagnosis not present

## 2017-04-01 DIAGNOSIS — R319 Hematuria, unspecified: Secondary | ICD-10-CM | POA: Diagnosis not present

## 2017-04-01 LAB — POCT URINALYSIS DIP (DEVICE)
Glucose, UA: 100 mg/dL — AB
Ketones, ur: NEGATIVE mg/dL
Nitrite: NEGATIVE
Protein, ur: >=300 mg/dL — AB
SPECIFIC GRAVITY, URINE: 1.02 (ref 1.005–1.030)
Urobilinogen, UA: 0.2 mg/dL (ref 0.0–1.0)
pH: 7.5 (ref 5.0–8.0)

## 2017-04-01 NOTE — Patient Instructions (Signed)
Your urinalysis shows hematuria.  Will send urine for culture and follow-up by phone with any abnormal results.  We will also evaluate further with a CT of the abdomen and pelvis.  You have been scheduled for CT on March 8.  We will follow-up by phone after reviewing CT results.

## 2017-04-02 DIAGNOSIS — N186 End stage renal disease: Secondary | ICD-10-CM | POA: Diagnosis not present

## 2017-04-02 DIAGNOSIS — Z992 Dependence on renal dialysis: Secondary | ICD-10-CM | POA: Diagnosis not present

## 2017-04-02 DIAGNOSIS — N2581 Secondary hyperparathyroidism of renal origin: Secondary | ICD-10-CM | POA: Diagnosis not present

## 2017-04-02 DIAGNOSIS — D509 Iron deficiency anemia, unspecified: Secondary | ICD-10-CM | POA: Diagnosis not present

## 2017-04-02 DIAGNOSIS — D631 Anemia in chronic kidney disease: Secondary | ICD-10-CM | POA: Diagnosis not present

## 2017-04-02 LAB — CBC WITH DIFFERENTIAL/PLATELET
BASOS ABS: 0 10*3/uL (ref 0.0–0.2)
Basos: 0 %
EOS (ABSOLUTE): 0.1 10*3/uL (ref 0.0–0.4)
Eos: 1 %
HEMOGLOBIN: 13.9 g/dL (ref 13.0–17.7)
Hematocrit: 43.2 % (ref 37.5–51.0)
Immature Grans (Abs): 0 10*3/uL (ref 0.0–0.1)
Immature Granulocytes: 0 %
LYMPHS ABS: 2 10*3/uL (ref 0.7–3.1)
Lymphs: 29 %
MCH: 29.1 pg (ref 26.6–33.0)
MCHC: 32.2 g/dL (ref 31.5–35.7)
MCV: 90 fL (ref 79–97)
MONOCYTES: 9 %
Monocytes Absolute: 0.6 10*3/uL (ref 0.1–0.9)
NEUTROS ABS: 4.2 10*3/uL (ref 1.4–7.0)
Neutrophils: 61 %
PLATELETS: 121 10*3/uL — AB (ref 150–379)
RBC: 4.78 x10E6/uL (ref 4.14–5.80)
RDW: 17.2 % — ABNORMAL HIGH (ref 12.3–15.4)
WBC: 7 10*3/uL (ref 3.4–10.8)

## 2017-04-03 ENCOUNTER — Ambulatory Visit (HOSPITAL_COMMUNITY)
Admission: RE | Admit: 2017-04-03 | Discharge: 2017-04-03 | Disposition: A | Discharge status: Home / Self Care | Payer: Medicare Other | Source: Ambulatory Visit | Attending: Family Medicine | Admitting: Family Medicine

## 2017-04-03 DIAGNOSIS — N186 End stage renal disease: Secondary | ICD-10-CM | POA: Diagnosis not present

## 2017-04-03 DIAGNOSIS — Z992 Dependence on renal dialysis: Secondary | ICD-10-CM | POA: Diagnosis not present

## 2017-04-03 DIAGNOSIS — R109 Unspecified abdominal pain: Secondary | ICD-10-CM

## 2017-04-03 DIAGNOSIS — N261 Atrophy of kidney (terminal): Secondary | ICD-10-CM | POA: Diagnosis not present

## 2017-04-03 DIAGNOSIS — N2889 Other specified disorders of kidney and ureter: Secondary | ICD-10-CM | POA: Diagnosis not present

## 2017-04-03 DIAGNOSIS — R319 Hematuria, unspecified: Secondary | ICD-10-CM | POA: Diagnosis not present

## 2017-04-03 DIAGNOSIS — N400 Enlarged prostate without lower urinary tract symptoms: Secondary | ICD-10-CM | POA: Diagnosis not present

## 2017-04-03 DIAGNOSIS — N200 Calculus of kidney: Secondary | ICD-10-CM | POA: Insufficient documentation

## 2017-04-03 DIAGNOSIS — N289 Disorder of kidney and ureter, unspecified: Secondary | ICD-10-CM | POA: Diagnosis not present

## 2017-04-04 LAB — URINE CULTURE

## 2017-04-05 NOTE — Progress Notes (Signed)
Chief Complaint  Patient presents with  . Hematuria    started this morning    Jack Irwin, a 52 year old male with a history of end stage renal disease, type 2 diabetes mellitus, and hypertension presents complaining of blood in urine that started this am. Patient has a history of kidney disease. He undergoes dialysis 3 days per week. There is not a history of nephrolithiasis.  Patient denies history of chronic Foley catheter, occupational exposure, sexually transmitted diseases, tobacco use, trauma and urolithiasis.  Past Medical History:  Diagnosis Date  . Anemia   . Arthritis   . BPH (benign prostatic hyperplasia)   . Chronic back pain   . Difficulty walking   . DVT (deep venous thrombosis) ([place])    Right popliteal DVT December 2017  . End-stage renal disease on hemodialysis ([place])    Dr. Lowanda Foster  . Erectile dysfunction   . Essential hypertension   . Gout   . Neuropathy, diabetic ([place])   . Type 2 diabetes mellitus ([place])   . Venous (peripheral) insufficiency    Immunization History  Administered Date(s) Administered  . PPD Test 02/17/2014   Social History   Socioeconomic History  . Marital status: Married    Spouse name: Not on file  . Number of children: Not on file  . Years of education: Not on file  . Highest education level: Not on file  Social Needs  . Financial resource strain: Not on file  . Food insecurity - worry: Not on file  . Food insecurity - inability: Not on file  . Transportation needs - medical: Not on file  . Transportation needs - non-medical: Not on file  Occupational History  . Not on file  Tobacco Use  . Smoking status: Never Smoker  . Smokeless tobacco: Never Used  Substance and Sexual Activity  . Alcohol use: No    Alcohol/week: 0.0 oz  . Drug use: No  . Sexual activity: Not on file  Other Topics Concern  . Not on file  Social History Narrative  . Not on file   Allergies as of 04/01/2017      Reactions   Doxycycline Itching      Medication List        Accurate as of 04/01/17 11:59 PM. Always use your most recent med list.          apixaban 2.5 MG Tabs tablet Commonly known as:  ELIQUIS Take 1 tablet (2.5 mg total) 2 (two) times daily by mouth.   betamethasone dipropionate 0.05 % cream Commonly known as:  DIPROLENE Apply topically 2 (two) times daily.   colchicine 0.6 MG tablet Take 0.5 tablets (0.3 mg total) by mouth daily as needed (for gout).   DAILY VITE PO Take 1 tablet by mouth daily.   diclofenac sodium 1 % Gel Commonly known as:  VOLTAREN Apply 4 g topically 4 (four) times daily.   gabapentin 100 MG capsule Commonly known as:  NEURONTIN Take 1 capsule (100 mg total) by mouth daily.   Menthol (Topical Analgesic) 4 % Gel Commonly known as:  BIOFREEZE Apply 1 each topically 3 (three) times daily as needed.   nystatin powder Commonly known as:  nystatin Apply topically 2 (two) times daily.   sevelamer carbonate 800 MG tablet Commonly known as:  RENVELA Take 1 tablet (800 mg total) by mouth 2 (two) times daily between meals as needed (With snacks).   sildenafil 20 MG tablet Commonly known as:  REVATIO Take 1  tablet (20 mg total) by mouth daily as needed. Dissolved under tongue (TROCHE) 60 minutes prior to sexual encounter   traMADol 50 MG tablet Commonly known as:  ULTRAM Take 1 tablet (50 mg total) by mouth every 6 (six) hours as needed.   triamcinolone cream 0.1 % Commonly known as:  KENALOG Apply 1 application topically 2 (two) times daily as needed.   ULORIC 40 MG tablet Generic drug:  febuxostat Take 1 tablet (40 mg total) daily by mouth.     Review of Systems  Constitutional: Negative.   HENT: Negative.   Eyes: Negative.   Respiratory: Negative.   Cardiovascular: Negative.   Gastrointestinal: Negative.  Negative for blood in stool.  Genitourinary: Positive for hematuria. Negative for dysuria.  Skin: Negative.   Neurological: Negative.   Endo/Heme/Allergies:  Negative.   Psychiatric/Behavioral: Negative.    Physical Exam  Constitutional: He is oriented to person, place, and time. He appears well-developed and well-nourished.  Cardiovascular: Normal rate, regular rhythm and normal heart sounds.  Pulmonary/Chest: Effort normal and breath sounds normal.  Abdominal: There is generalized tenderness. There is no CVA tenderness.  Neurological: He is alert and oriented to person, place, and time.   Plan   Hematuria, unspecified type Will follow up by phone after reviewing CT of abdomen/pelvis.  - POCT urinalysis dip (device) - CBC with Differential - Urine Culture - CT Abdomen Pelvis W Contrast; Future - Urinalysis, microscopic only - CT Abdomen Pelvis Wo Contrast; Future  Abdominal pain, unspecified abdominal location - POCT urinalysis dip (device) - CT Abdomen Pelvis Wo Contrast; Future   RTC: Will follow up by phone with any abnormal lab results  [name]  MSN, FNP-C Patient [facility] [address] [place], [place] [address] [phone]

## 2017-04-06 ENCOUNTER — Telehealth: Payer: Self-pay | Admitting: Family Medicine

## 2017-04-06 ENCOUNTER — Other Ambulatory Visit: Payer: Self-pay | Admitting: Family Medicine

## 2017-04-06 ENCOUNTER — Telehealth: Payer: Self-pay

## 2017-04-06 DIAGNOSIS — N2581 Secondary hyperparathyroidism of renal origin: Secondary | ICD-10-CM | POA: Diagnosis not present

## 2017-04-06 DIAGNOSIS — D509 Iron deficiency anemia, unspecified: Secondary | ICD-10-CM | POA: Diagnosis not present

## 2017-04-06 DIAGNOSIS — Z992 Dependence on renal dialysis: Secondary | ICD-10-CM | POA: Diagnosis not present

## 2017-04-06 DIAGNOSIS — N400 Enlarged prostate without lower urinary tract symptoms: Secondary | ICD-10-CM

## 2017-04-06 DIAGNOSIS — R93429 Abnormal radiologic findings on diagnostic imaging of unspecified kidney: Secondary | ICD-10-CM

## 2017-04-06 DIAGNOSIS — N186 End stage renal disease: Secondary | ICD-10-CM

## 2017-04-06 DIAGNOSIS — N3001 Acute cystitis with hematuria: Secondary | ICD-10-CM

## 2017-04-06 DIAGNOSIS — R112 Nausea with vomiting, unspecified: Secondary | ICD-10-CM | POA: Diagnosis not present

## 2017-04-06 MED ORDER — SULFAMETHOXAZOLE-TRIMETHOPRIM 400-80 MG PO TABS: 1.0000 | ORAL_TABLET | Freq: Every day | ORAL | 0 refills | Status: AC

## 2017-04-06 NOTE — Progress Notes (Signed)
Meds ordered this encounter  Medications  . sulfamethoxazole-trimethoprim (BACTRIM) 400-80 MG tablet    Sig: Take 1 tablet by mouth daily for 7 days.    Dispense:  7 tablet    Refill:  0    [name]  MSN, FNP-C Patient [facility] [address] [place], [place] [address] [phone]

## 2017-04-06 NOTE — Telephone Encounter (Signed)
Called, no answer. Left a message for patient to call back. Thanks!  

## 2017-04-06 NOTE — Telephone Encounter (Signed)
Jack Irwin, a 52 year old male with a history of ESRD presented with hematuria on 04/02/2017. Urine culture yielded strep species that is resistant to most antibiotics. Will start Bactrim 400-80, which is renally adjusted. Will follow up in 2 weeks.   Reviewed CT scan, which shows the following.   IMPRESSION: 1. Apparent slightly enlarging complex lesion within the UPPER RIGHT kidney. Given prior findings, it is difficult to determine if this represents a benign complex cyst or slightly enlarging cystic neoplasm. 2. Unchanged 1.8 cm indeterminate lesion within the anterior LEFT mid kidney. 3. If the patient is on dialysis and no other contraindications to receiving CT IV contrast, strongly consider abdominal CT with and without contrast to evaluate any enhancement of the above renal lesions. 4. Atrophic kidneys with nonobstructing LEFT renal calculi. 5. Prostate enlargement   Will send a referral to urology for prostate enlargement. Will also scheduled follow up with nephrology for abnormal CT.    [name]  MSN, FNP-C [facility] [id number] [place], [place] [address] [phone]

## 2017-04-06 NOTE — Telephone Encounter (Signed)
-----   Message from Dorena Dew, [place] sent at 04/06/2017  5:35 AM EST ----- Regarding: lab results and referrals Please inform patient that he has a urinary tract infection. Will start Bactrim 400-80 mg daily for 7 days.  He has an enlarged prostate, will send a referral to urology for further work up and evaluation.  Will also schedule follow up with nephrologist.   Please call office of Fran Lowes, nephrologist to inquire whether patient is established. If he has not established, I have placed a referral. Please fax CT scan to both nephrology and urology.  Schedule lab appointment in 1 week for urinalysis.    Thanks

## 2017-04-07 NOTE — Telephone Encounter (Signed)
Called and spoke with patient, advised he has a urinary tract infection and we have sent in Bactrim for 7 days he should take as directed. Advised that his prostate is enlarged and we will send to urology for further work up. He states he does not see a nephrologist so we will also send to nephrology to establish. Asked that we make a lab appointment in 1 week for follow up urinalysis, patient states he will have to call back because he doesn't know his wife's scheduled when she can bring him back. Thanks!

## 2017-04-08 ENCOUNTER — Ambulatory Visit (HOSPITAL_COMMUNITY): Payer: Medicare Other

## 2017-04-08 DIAGNOSIS — Z992 Dependence on renal dialysis: Secondary | ICD-10-CM | POA: Diagnosis not present

## 2017-04-08 DIAGNOSIS — D509 Iron deficiency anemia, unspecified: Secondary | ICD-10-CM | POA: Diagnosis not present

## 2017-04-08 DIAGNOSIS — N2581 Secondary hyperparathyroidism of renal origin: Secondary | ICD-10-CM | POA: Diagnosis not present

## 2017-04-08 DIAGNOSIS — N186 End stage renal disease: Secondary | ICD-10-CM | POA: Diagnosis not present

## 2017-04-08 DIAGNOSIS — R112 Nausea with vomiting, unspecified: Secondary | ICD-10-CM | POA: Diagnosis not present

## 2017-04-10 DIAGNOSIS — R112 Nausea with vomiting, unspecified: Secondary | ICD-10-CM | POA: Diagnosis not present

## 2017-04-10 DIAGNOSIS — N186 End stage renal disease: Secondary | ICD-10-CM | POA: Diagnosis not present

## 2017-04-10 DIAGNOSIS — Z992 Dependence on renal dialysis: Secondary | ICD-10-CM | POA: Diagnosis not present

## 2017-04-10 DIAGNOSIS — N2581 Secondary hyperparathyroidism of renal origin: Secondary | ICD-10-CM | POA: Diagnosis not present

## 2017-04-10 DIAGNOSIS — D509 Iron deficiency anemia, unspecified: Secondary | ICD-10-CM | POA: Diagnosis not present

## 2017-04-13 DIAGNOSIS — Z992 Dependence on renal dialysis: Secondary | ICD-10-CM | POA: Diagnosis not present

## 2017-04-13 DIAGNOSIS — N2581 Secondary hyperparathyroidism of renal origin: Secondary | ICD-10-CM | POA: Diagnosis not present

## 2017-04-13 DIAGNOSIS — R112 Nausea with vomiting, unspecified: Secondary | ICD-10-CM | POA: Diagnosis not present

## 2017-04-13 DIAGNOSIS — D509 Iron deficiency anemia, unspecified: Secondary | ICD-10-CM | POA: Diagnosis not present

## 2017-04-13 DIAGNOSIS — N186 End stage renal disease: Secondary | ICD-10-CM | POA: Diagnosis not present

## 2017-04-15 DIAGNOSIS — R112 Nausea with vomiting, unspecified: Secondary | ICD-10-CM | POA: Diagnosis not present

## 2017-04-15 DIAGNOSIS — Z992 Dependence on renal dialysis: Secondary | ICD-10-CM | POA: Diagnosis not present

## 2017-04-15 DIAGNOSIS — N186 End stage renal disease: Secondary | ICD-10-CM | POA: Diagnosis not present

## 2017-04-15 DIAGNOSIS — N2581 Secondary hyperparathyroidism of renal origin: Secondary | ICD-10-CM | POA: Diagnosis not present

## 2017-04-15 DIAGNOSIS — D509 Iron deficiency anemia, unspecified: Secondary | ICD-10-CM | POA: Diagnosis not present

## 2017-04-16 ENCOUNTER — Other Ambulatory Visit: Payer: Self-pay

## 2017-04-16 DIAGNOSIS — N281 Cyst of kidney, acquired: Secondary | ICD-10-CM | POA: Diagnosis not present

## 2017-04-16 DIAGNOSIS — R310 Gross hematuria: Secondary | ICD-10-CM | POA: Diagnosis not present

## 2017-04-17 DIAGNOSIS — Z992 Dependence on renal dialysis: Secondary | ICD-10-CM | POA: Diagnosis not present

## 2017-04-17 DIAGNOSIS — N186 End stage renal disease: Secondary | ICD-10-CM | POA: Diagnosis not present

## 2017-04-17 DIAGNOSIS — N2581 Secondary hyperparathyroidism of renal origin: Secondary | ICD-10-CM | POA: Diagnosis not present

## 2017-04-17 DIAGNOSIS — R112 Nausea with vomiting, unspecified: Secondary | ICD-10-CM | POA: Diagnosis not present

## 2017-04-17 DIAGNOSIS — D509 Iron deficiency anemia, unspecified: Secondary | ICD-10-CM | POA: Diagnosis not present

## 2017-04-20 DIAGNOSIS — N186 End stage renal disease: Secondary | ICD-10-CM | POA: Diagnosis not present

## 2017-04-20 DIAGNOSIS — Z992 Dependence on renal dialysis: Secondary | ICD-10-CM | POA: Diagnosis not present

## 2017-04-20 DIAGNOSIS — D509 Iron deficiency anemia, unspecified: Secondary | ICD-10-CM | POA: Diagnosis not present

## 2017-04-20 DIAGNOSIS — N2581 Secondary hyperparathyroidism of renal origin: Secondary | ICD-10-CM | POA: Diagnosis not present

## 2017-04-20 DIAGNOSIS — R112 Nausea with vomiting, unspecified: Secondary | ICD-10-CM | POA: Diagnosis not present

## 2017-04-22 DIAGNOSIS — N186 End stage renal disease: Secondary | ICD-10-CM | POA: Diagnosis not present

## 2017-04-22 DIAGNOSIS — N2581 Secondary hyperparathyroidism of renal origin: Secondary | ICD-10-CM | POA: Diagnosis not present

## 2017-04-22 DIAGNOSIS — Z992 Dependence on renal dialysis: Secondary | ICD-10-CM | POA: Diagnosis not present

## 2017-04-22 DIAGNOSIS — R112 Nausea with vomiting, unspecified: Secondary | ICD-10-CM | POA: Diagnosis not present

## 2017-04-22 DIAGNOSIS — D509 Iron deficiency anemia, unspecified: Secondary | ICD-10-CM | POA: Diagnosis not present

## 2017-04-24 DIAGNOSIS — R112 Nausea with vomiting, unspecified: Secondary | ICD-10-CM | POA: Diagnosis not present

## 2017-04-24 DIAGNOSIS — N186 End stage renal disease: Secondary | ICD-10-CM | POA: Diagnosis not present

## 2017-04-24 DIAGNOSIS — N2581 Secondary hyperparathyroidism of renal origin: Secondary | ICD-10-CM | POA: Diagnosis not present

## 2017-04-24 DIAGNOSIS — Z992 Dependence on renal dialysis: Secondary | ICD-10-CM | POA: Diagnosis not present

## 2017-04-24 DIAGNOSIS — D509 Iron deficiency anemia, unspecified: Secondary | ICD-10-CM | POA: Diagnosis not present

## 2017-04-26 ENCOUNTER — Emergency Department (HOSPITAL_COMMUNITY)
Admission: EM | Admit: 2017-04-26 | Discharge: 2017-04-26 | Disposition: A | Discharge status: Home / Self Care | Payer: No Typology Code available for payment source | Attending: Emergency Medicine | Admitting: Emergency Medicine

## 2017-04-26 ENCOUNTER — Encounter (HOSPITAL_COMMUNITY): Payer: Self-pay | Admitting: Emergency Medicine

## 2017-04-26 ENCOUNTER — Emergency Department (HOSPITAL_COMMUNITY): Payer: No Typology Code available for payment source

## 2017-04-26 DIAGNOSIS — M545 Low back pain: Secondary | ICD-10-CM | POA: Diagnosis not present

## 2017-04-26 DIAGNOSIS — S4992XA Unspecified injury of left shoulder and upper arm, initial encounter: Secondary | ICD-10-CM | POA: Diagnosis not present

## 2017-04-26 DIAGNOSIS — Y929 Unspecified place or not applicable: Secondary | ICD-10-CM | POA: Insufficient documentation

## 2017-04-26 DIAGNOSIS — E1122 Type 2 diabetes mellitus with diabetic chronic kidney disease: Secondary | ICD-10-CM | POA: Insufficient documentation

## 2017-04-26 DIAGNOSIS — Z992 Dependence on renal dialysis: Secondary | ICD-10-CM | POA: Diagnosis not present

## 2017-04-26 DIAGNOSIS — M79662 Pain in left lower leg: Secondary | ICD-10-CM | POA: Diagnosis not present

## 2017-04-26 DIAGNOSIS — N186 End stage renal disease: Secondary | ICD-10-CM | POA: Diagnosis not present

## 2017-04-26 DIAGNOSIS — M542 Cervicalgia: Secondary | ICD-10-CM | POA: Insufficient documentation

## 2017-04-26 DIAGNOSIS — Y999 Unspecified external cause status: Secondary | ICD-10-CM | POA: Diagnosis not present

## 2017-04-26 DIAGNOSIS — I120 Hypertensive chronic kidney disease with stage 5 chronic kidney disease or end stage renal disease: Secondary | ICD-10-CM | POA: Insufficient documentation

## 2017-04-26 DIAGNOSIS — Z7901 Long term (current) use of anticoagulants: Secondary | ICD-10-CM | POA: Insufficient documentation

## 2017-04-26 DIAGNOSIS — Z79899 Other long term (current) drug therapy: Secondary | ICD-10-CM | POA: Insufficient documentation

## 2017-04-26 DIAGNOSIS — M79605 Pain in left leg: Secondary | ICD-10-CM | POA: Diagnosis not present

## 2017-04-26 DIAGNOSIS — Y939 Activity, unspecified: Secondary | ICD-10-CM | POA: Diagnosis not present

## 2017-04-26 DIAGNOSIS — M791 Myalgia, unspecified site: Secondary | ICD-10-CM

## 2017-04-26 DIAGNOSIS — M25512 Pain in left shoulder: Secondary | ICD-10-CM | POA: Diagnosis not present

## 2017-04-26 MED ORDER — METHOCARBAMOL 500 MG PO TABS: 500.0000 mg | ORAL_TABLET | Freq: Two times a day (BID) | ORAL | 0 refills | Status: DC | PRN

## 2017-04-26 MED ORDER — HYDROCODONE-ACETAMINOPHEN 5-325 MG PO TABS
1.0000 | ORAL_TABLET | Freq: Once | ORAL | Status: AC
  Administered 2017-04-26: 1 via ORAL
  Filled 2017-04-26 (×2): qty 1
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
## 2017-04-26 NOTE — ED Notes (Signed)
Pt refused discharge vital signs
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
## 2017-04-27 DIAGNOSIS — N2581 Secondary hyperparathyroidism of renal origin: Secondary | ICD-10-CM | POA: Diagnosis not present

## 2017-04-27 DIAGNOSIS — R112 Nausea with vomiting, unspecified: Secondary | ICD-10-CM | POA: Diagnosis not present

## 2017-04-27 DIAGNOSIS — D509 Iron deficiency anemia, unspecified: Secondary | ICD-10-CM | POA: Diagnosis not present

## 2017-04-27 DIAGNOSIS — Z992 Dependence on renal dialysis: Secondary | ICD-10-CM | POA: Diagnosis not present

## 2017-04-27 DIAGNOSIS — N186 End stage renal disease: Secondary | ICD-10-CM | POA: Diagnosis not present

## 2017-04-29 ENCOUNTER — Ambulatory Visit (INDEPENDENT_AMBULATORY_CARE_PROVIDER_SITE_OTHER): Payer: Medicare Other | Admitting: Urology

## 2017-04-29 DIAGNOSIS — R310 Gross hematuria: Secondary | ICD-10-CM

## 2017-04-30 ENCOUNTER — Emergency Department (HOSPITAL_COMMUNITY)
Admission: EM | Admit: 2017-04-30 | Discharge: 2017-04-30 | Disposition: A | Discharge status: Home / Self Care | Payer: No Typology Code available for payment source

## 2017-04-30 NOTE — ED Notes (Signed)
Pt stated he did not want to be seen. We have a wait time and he states, "I'll come back later,"

## 2017-05-01 DIAGNOSIS — R112 Nausea with vomiting, unspecified: Secondary | ICD-10-CM | POA: Diagnosis not present

## 2017-05-01 DIAGNOSIS — Z992 Dependence on renal dialysis: Secondary | ICD-10-CM | POA: Diagnosis not present

## 2017-05-01 DIAGNOSIS — N186 End stage renal disease: Secondary | ICD-10-CM | POA: Diagnosis not present

## 2017-05-01 DIAGNOSIS — N2581 Secondary hyperparathyroidism of renal origin: Secondary | ICD-10-CM | POA: Diagnosis not present

## 2017-05-01 DIAGNOSIS — D509 Iron deficiency anemia, unspecified: Secondary | ICD-10-CM | POA: Diagnosis not present

## 2017-05-03 DIAGNOSIS — N186 End stage renal disease: Secondary | ICD-10-CM | POA: Diagnosis not present

## 2017-05-03 DIAGNOSIS — Z992 Dependence on renal dialysis: Secondary | ICD-10-CM | POA: Diagnosis not present

## 2017-05-04 IMAGING — DX DG CHEST 2V
2 series · 2 of 2 positions shown · non-contrast
Comparison: 12/20/2014

CLINICAL DATA: Rectal pain for 2 days.  Fever and cough.

EXAM:
CHEST  2 VIEW

[chest pa]
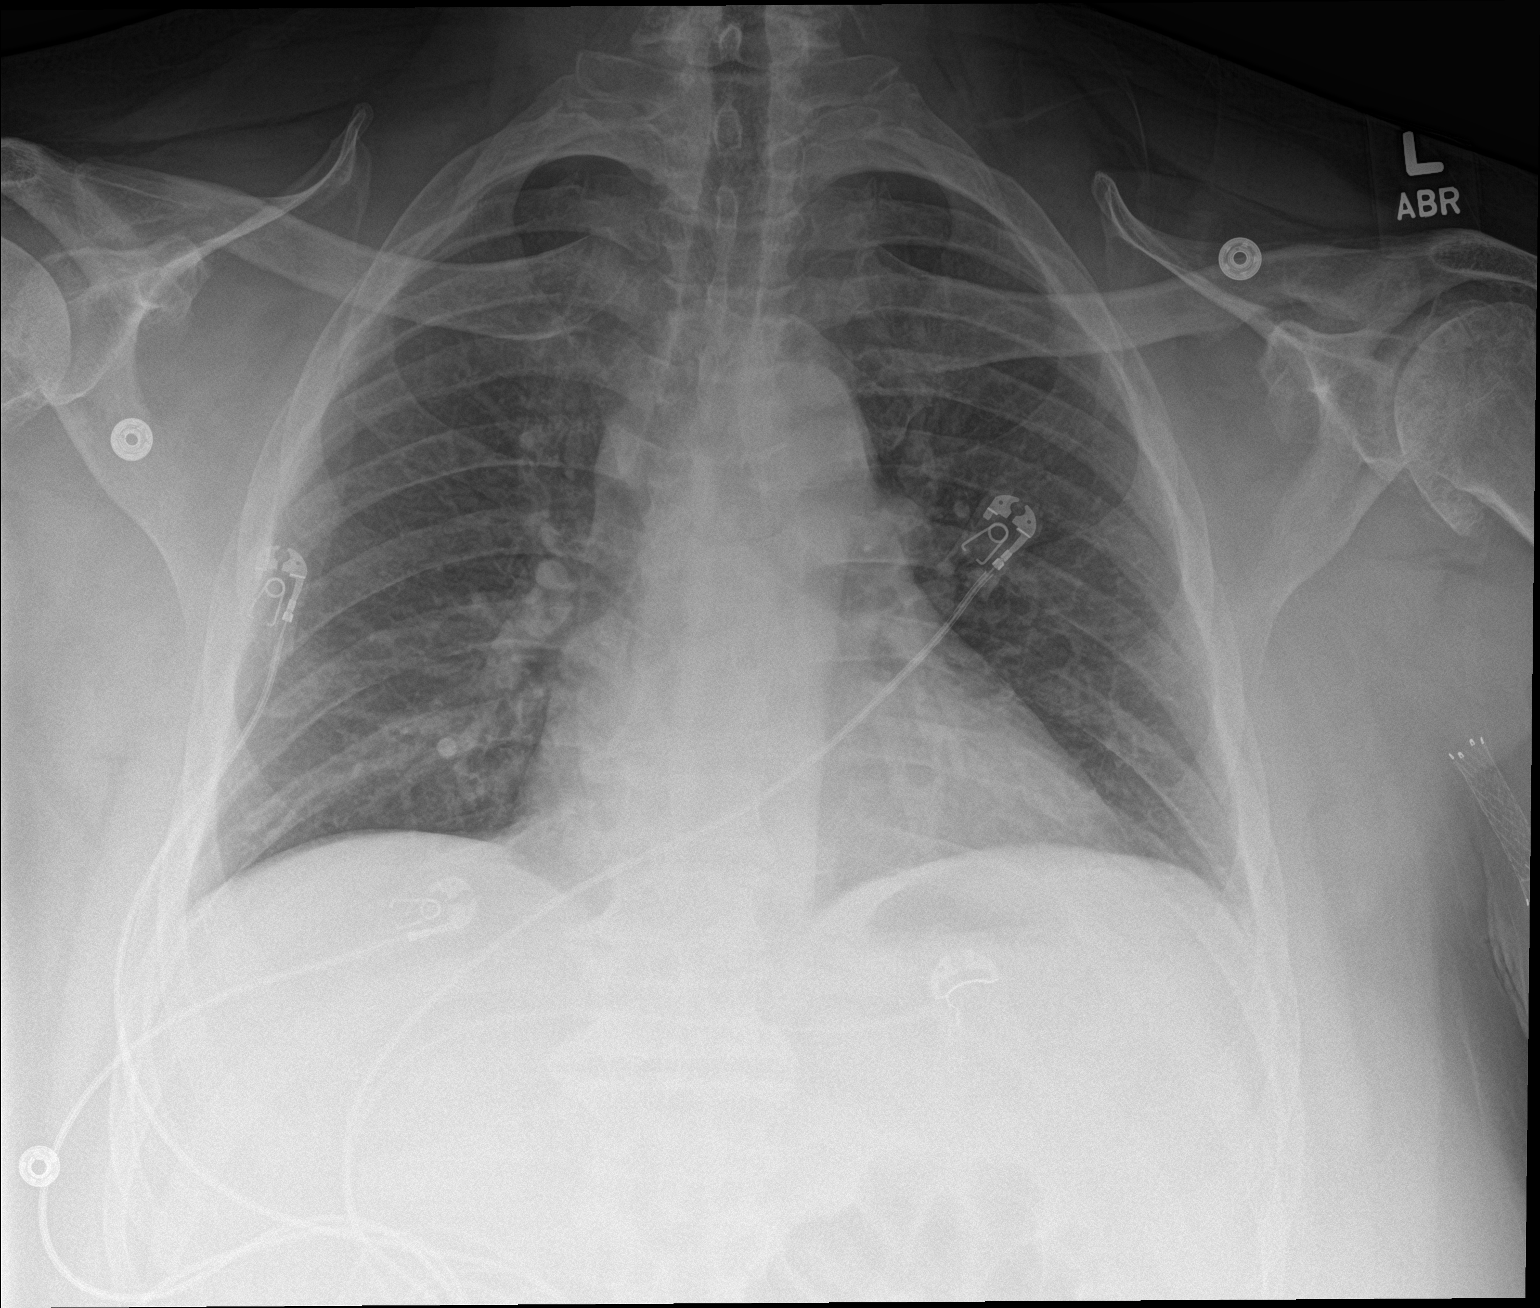

[chest lat]
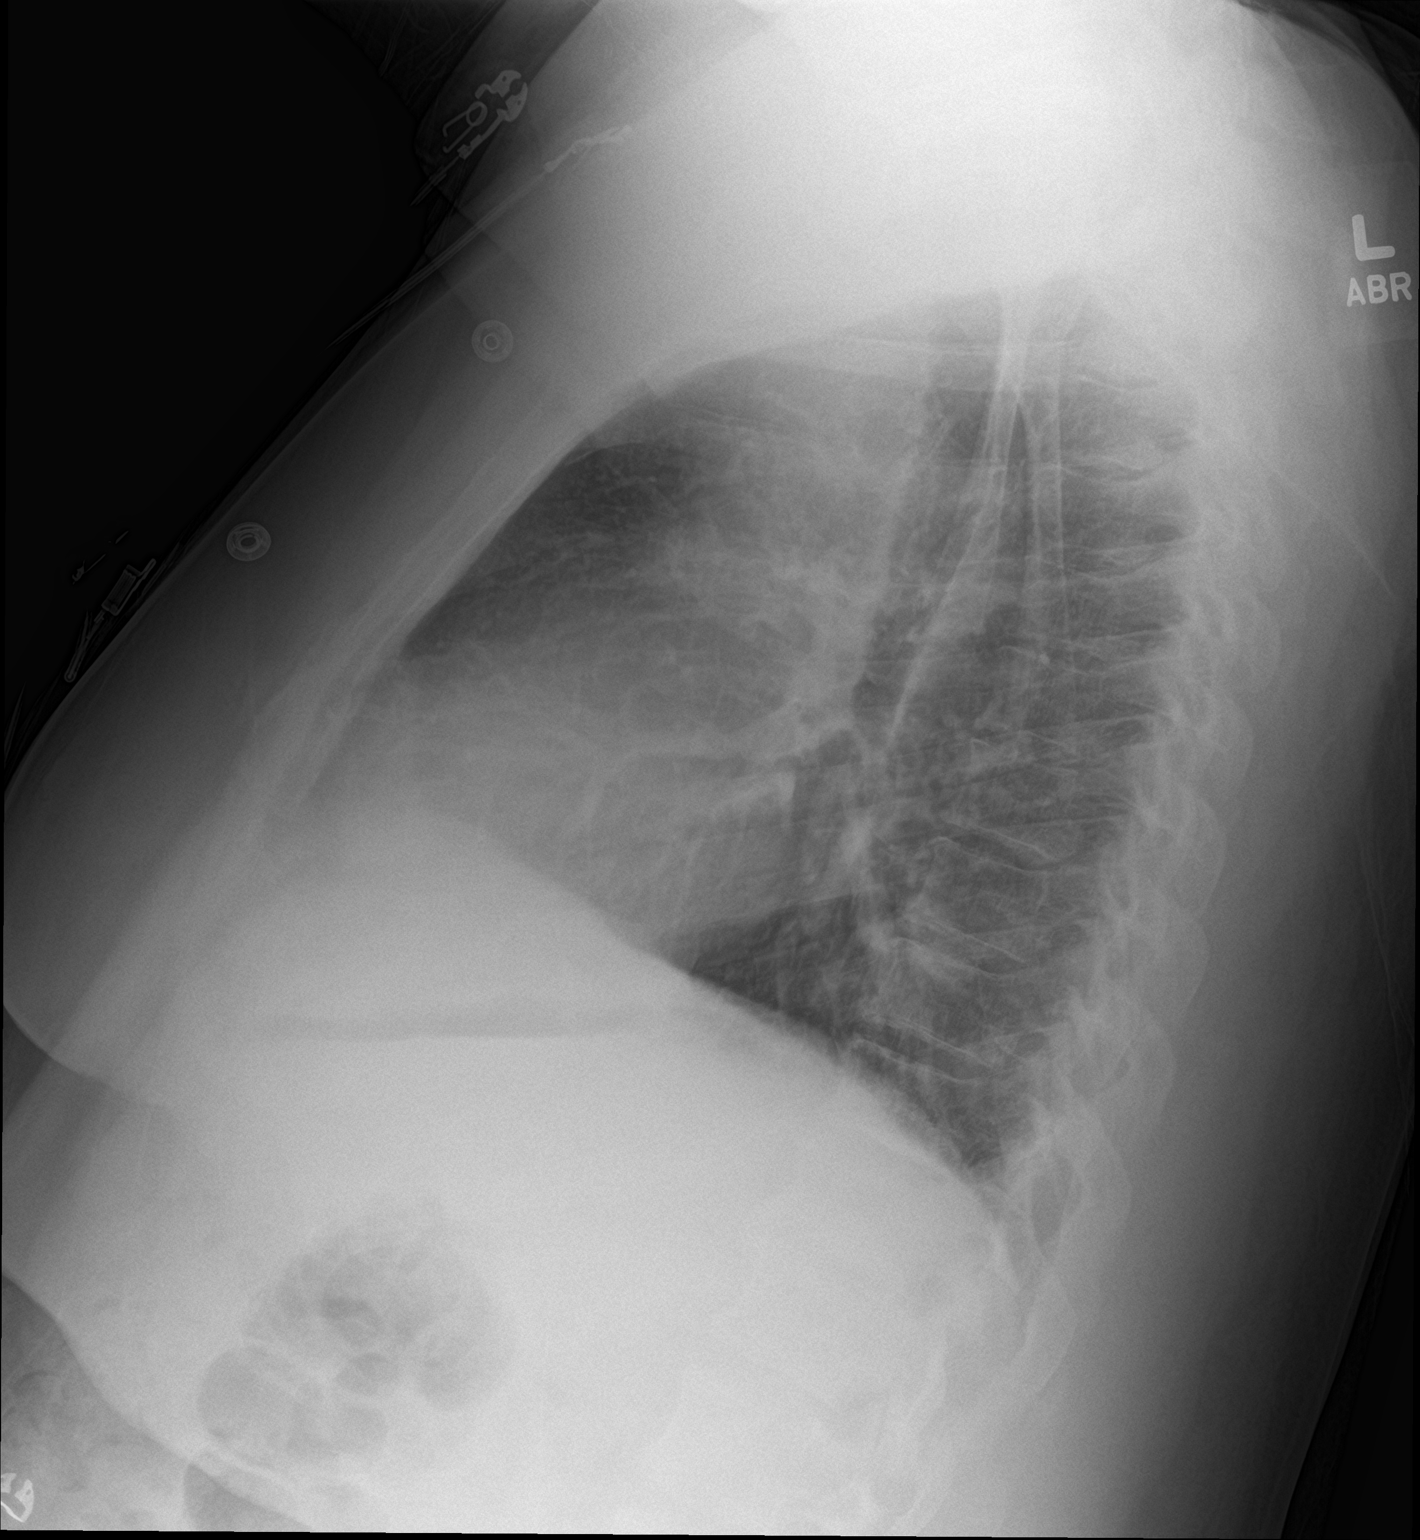

[2 of 2 positions shown; findings below may reference images not displayed]

FINDINGS: Normal heart size. No pleural effusion or edema identified. No
airspace consolidation noted. Spondylosis noted within the thoracic
spine. There is osteoarthritis involving the glenohumeral joints.
IMPRESSION: 1. No acute cardiopulmonary abnormalities.

## 2017-05-04 IMAGING — CT CT RENAL STONE PROTOCOL
2 of 4 series · 15 of 46 positions shown, 17 images · non-contrast
Comparison: CT of the abdomen pelvis dated 05/07/2010

CLINICAL DATA: 50-year-old male with rectal pain and bleeding.

EXAM:
CT ABDOMEN AND PELVIS WITHOUT CONTRAST
TECHNIQUE: Multidetector CT imaging of the abdomen and pelvis was performed
following the standard protocol without IV contrast.

[Series 2: axial st · axial · 0.83mm/px · z∈[+474,+914]mm · 12 of 102 slices shown, 14 images]
[im 9/102  soft-tissue]
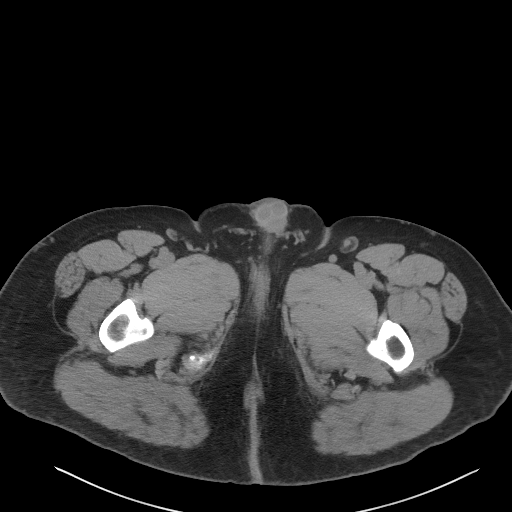
[im 9/102  bone]
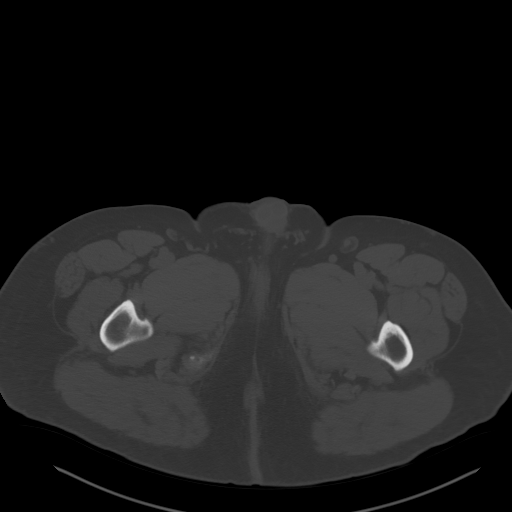
[im 17/102  soft-tissue]
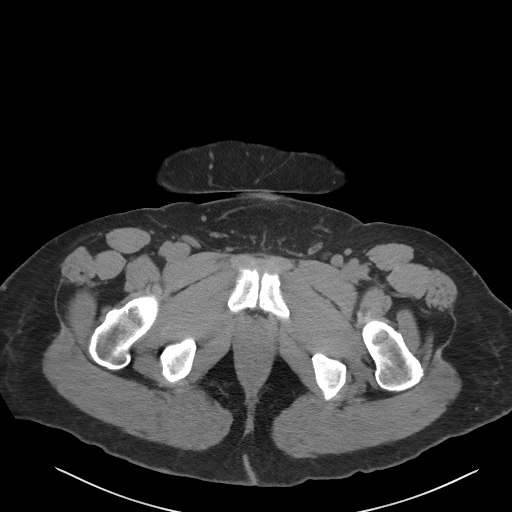
[im 25/102  soft-tissue]
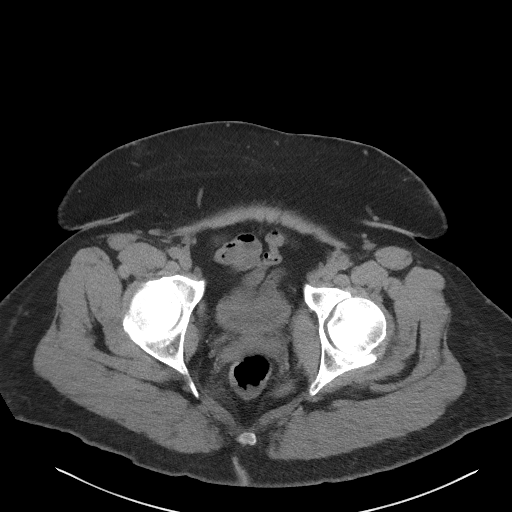
[im 33/102  soft-tissue]
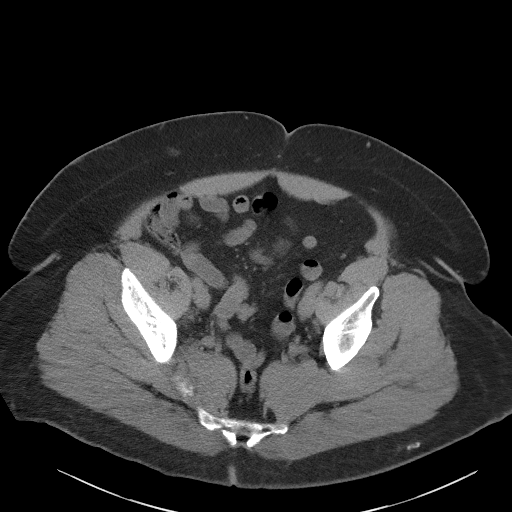
[im 41/102  soft-tissue]
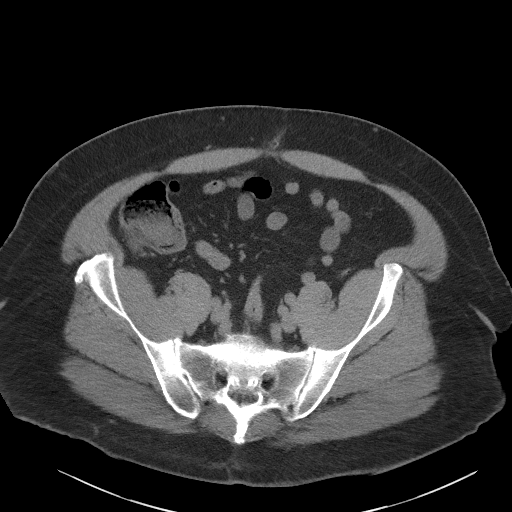
[im 49/102  soft-tissue]
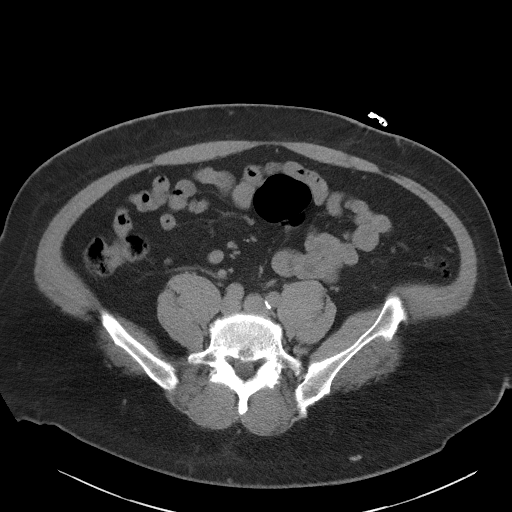
[im 57/102  soft-tissue]
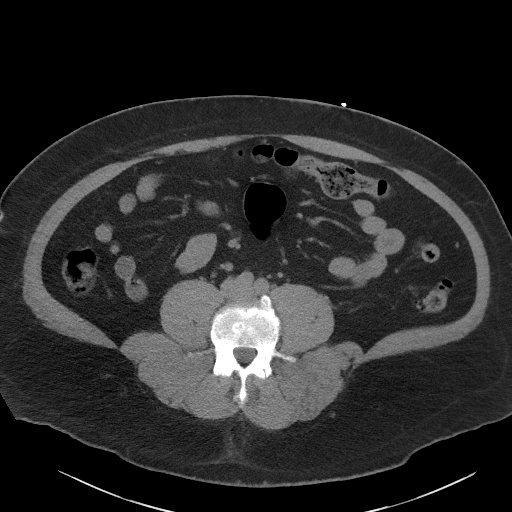
[im 65/102  soft-tissue]
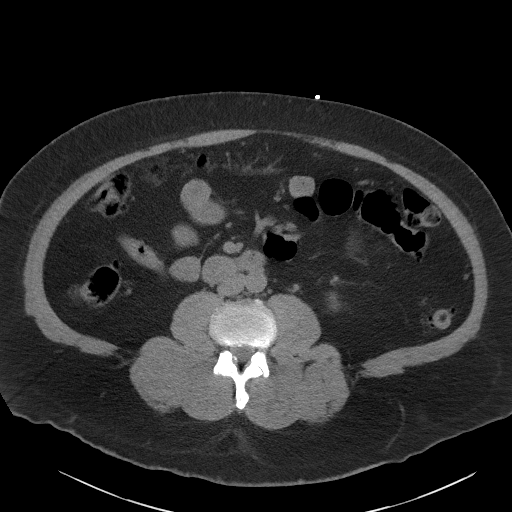
[im 73/102  soft-tissue]
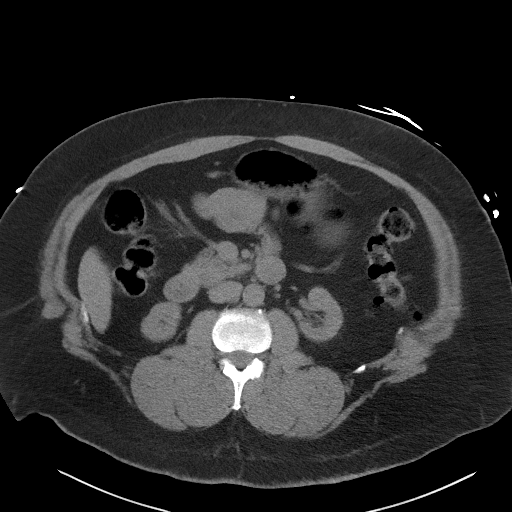
[im 73/102  bone]
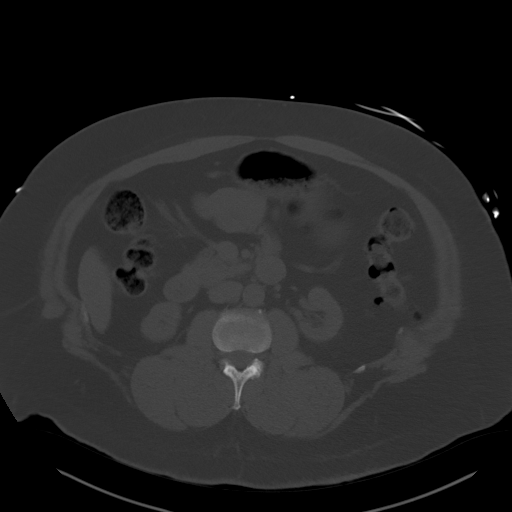
[im 81/102  soft-tissue]
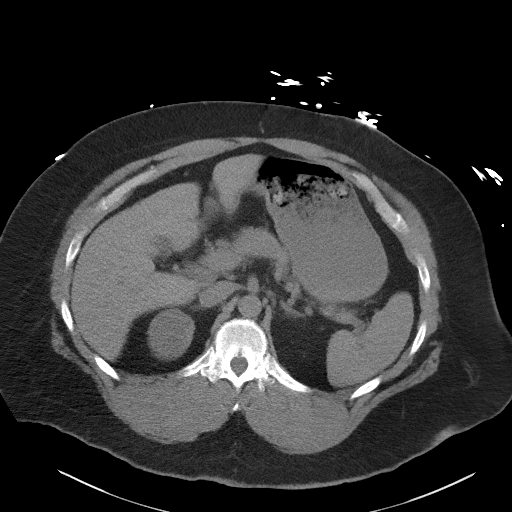
[im 89/102  soft-tissue]
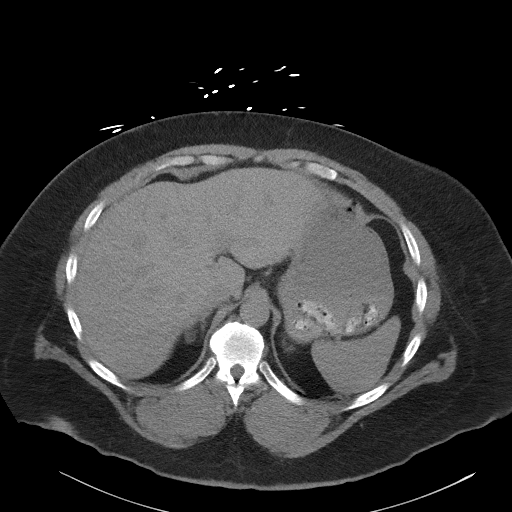
[im 97/102  soft-tissue]
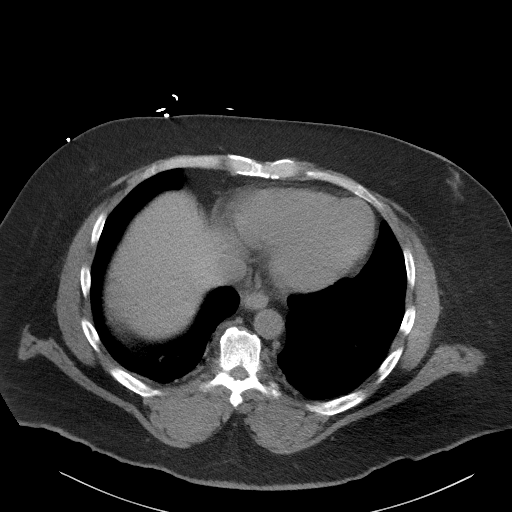

[Series 4: coronal st · coronal · 1.05mm/px · 3 of 120 slices shown]
[im 40/120  soft-tissue]
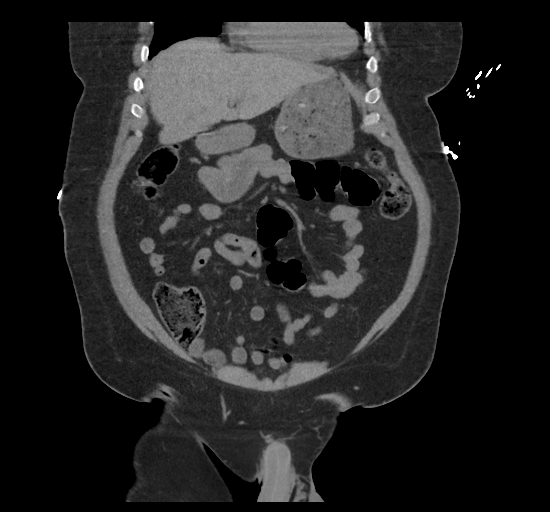
[im 53/120  soft-tissue]
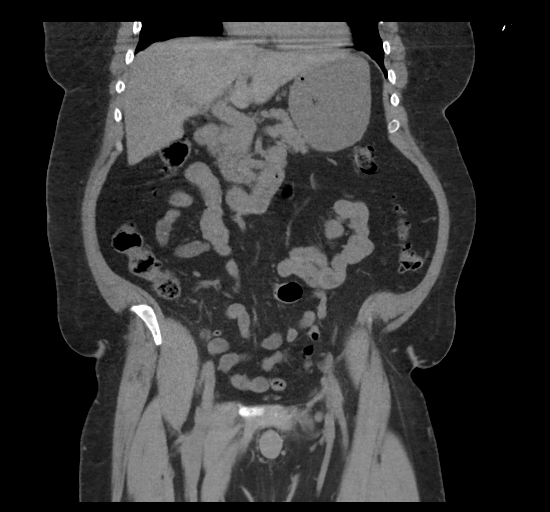
[im 67/120  soft-tissue]
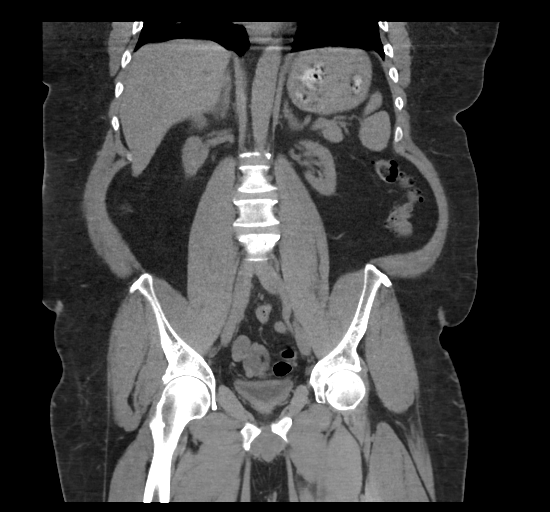

[15 of 46 positions shown; findings below may reference images not displayed]

FINDINGS: Evaluation of this exam is limited in the absence of intravenous
contrast.

Lower chest: The visualized lung bases are clear. No intra-abdominal
free air or free fluid.

Hepatobiliary: No focal liver abnormality is seen. No gallstones,
gallbladder wall thickening, or biliary dilatation.

Pancreas: Unremarkable. No pancreatic ductal dilatation or
surrounding inflammatory changes.

Spleen: Normal in size without focal abnormality.

Adrenals/Urinary Tract: There is a 9 mm right adrenal adenoma. The
left adrenal gland appears unremarkable. There is mild moderate
bilateral renal atrophy. There is a 3.3 x 2.2 cm partially exophytic
right renal upper pole hypodense lesion which is incompletely
characterized but appears new or increased in size since the prior
CT. This may represent a somewhat complex cyst, however the lesion
with solid components not excluded. A 1.3 x 1.4 cm exophytic lesion
noted from the anterior interpolar aspect of the left kidney.
Further evaluation of the kidneys with nonemergent MRI without and
with contrast is recommended. There is no hydronephrosis or
nephrolithiasis on either side. The visualized ureters and urinary
bladder appear unremarkable.

Stomach/Bowel: There is moderate stool throughout the colon. There
is colonic diverticulosis without active inflammatory changes. No
evidence of bowel obstruction or active inflammation. No perirectal
fluid collection or inflammatory changes identified. The appendix
appears unremarkable.

Vascular/Lymphatic: No significant vascular findings are present. No
enlarged abdominal or pelvic lymph nodes.

Reproductive: Prostate gland is grossly unremarkable.

Other: There is mild thickened appearance of the right gluteal skin
along the gluteal fissure (series 2, image 96). Minimal fluid
collection measuring 2.7 x 1.0 cm (series 4, image 106) noted along
this region. Correlation with clinical exam recommended. Ultrasound
may provide additional information.

Musculoskeletal: There is degenerative changes of the spine. L3-L4
disc desiccation with vacuum phenomena. No acute fracture. There is
mild intubated appearance of the paraspinal fat at T8 (series 2,
image 1). This area is only partially visualized and incompletely
evaluated. Although this may be related to chronic changes or
paraspinal vessels an infectious process is not excluded.
Correlation with clinical exam recommended to evaluate for point
tenderness over the lower thoracic spine.
IMPRESSION: Mild thickening of the skin of the medial aspect of the right
gluteal region along the gluteal fissure with a probable small
subcutaneous fluid collection. Clinical correlation is recommended.

No perirectal inflammation or abscess.

**An incidental finding of potential clinical significance has been
found. Bilateral renal hypodense lesions as described. Further
evaluation with nonemergent MRI without and with contrast
recommended.**

Mild induration of the paraspinal soft tissues at T8, likely chronic
or related to paraspinal vessels. An infectious process is less
likely. Correlation with clinical exam recommended to evaluate for
focal tenderness.

## 2017-05-06 DIAGNOSIS — N186 End stage renal disease: Secondary | ICD-10-CM | POA: Diagnosis not present

## 2017-05-06 DIAGNOSIS — D509 Iron deficiency anemia, unspecified: Secondary | ICD-10-CM | POA: Diagnosis not present

## 2017-05-06 DIAGNOSIS — R112 Nausea with vomiting, unspecified: Secondary | ICD-10-CM | POA: Diagnosis not present

## 2017-05-06 DIAGNOSIS — Z992 Dependence on renal dialysis: Secondary | ICD-10-CM | POA: Diagnosis not present

## 2017-05-06 DIAGNOSIS — N2581 Secondary hyperparathyroidism of renal origin: Secondary | ICD-10-CM | POA: Diagnosis not present

## 2017-05-07 ENCOUNTER — Ambulatory Visit: Payer: Self-pay | Admitting: Family Medicine

## 2017-05-08 DIAGNOSIS — N2581 Secondary hyperparathyroidism of renal origin: Secondary | ICD-10-CM | POA: Diagnosis not present

## 2017-05-08 DIAGNOSIS — D509 Iron deficiency anemia, unspecified: Secondary | ICD-10-CM | POA: Diagnosis not present

## 2017-05-08 DIAGNOSIS — Z992 Dependence on renal dialysis: Secondary | ICD-10-CM | POA: Diagnosis not present

## 2017-05-08 DIAGNOSIS — N186 End stage renal disease: Secondary | ICD-10-CM | POA: Diagnosis not present

## 2017-05-08 DIAGNOSIS — R112 Nausea with vomiting, unspecified: Secondary | ICD-10-CM | POA: Diagnosis not present

## 2017-05-12 DIAGNOSIS — R112 Nausea with vomiting, unspecified: Secondary | ICD-10-CM | POA: Diagnosis not present

## 2017-05-12 DIAGNOSIS — N2581 Secondary hyperparathyroidism of renal origin: Secondary | ICD-10-CM | POA: Diagnosis not present

## 2017-05-12 DIAGNOSIS — N186 End stage renal disease: Secondary | ICD-10-CM | POA: Diagnosis not present

## 2017-05-12 DIAGNOSIS — E119 Type 2 diabetes mellitus without complications: Secondary | ICD-10-CM | POA: Diagnosis not present

## 2017-05-12 DIAGNOSIS — D509 Iron deficiency anemia, unspecified: Secondary | ICD-10-CM | POA: Diagnosis not present

## 2017-05-12 DIAGNOSIS — Z992 Dependence on renal dialysis: Secondary | ICD-10-CM | POA: Diagnosis not present

## 2017-05-13 DIAGNOSIS — R112 Nausea with vomiting, unspecified: Secondary | ICD-10-CM | POA: Diagnosis not present

## 2017-05-13 DIAGNOSIS — D509 Iron deficiency anemia, unspecified: Secondary | ICD-10-CM | POA: Diagnosis not present

## 2017-05-13 DIAGNOSIS — Z992 Dependence on renal dialysis: Secondary | ICD-10-CM | POA: Diagnosis not present

## 2017-05-13 DIAGNOSIS — N186 End stage renal disease: Secondary | ICD-10-CM | POA: Diagnosis not present

## 2017-05-13 DIAGNOSIS — N2581 Secondary hyperparathyroidism of renal origin: Secondary | ICD-10-CM | POA: Diagnosis not present

## 2017-05-14 ENCOUNTER — Ambulatory Visit: Payer: Self-pay | Admitting: Family Medicine

## 2017-05-15 DIAGNOSIS — Z992 Dependence on renal dialysis: Secondary | ICD-10-CM | POA: Diagnosis not present

## 2017-05-15 DIAGNOSIS — N2581 Secondary hyperparathyroidism of renal origin: Secondary | ICD-10-CM | POA: Diagnosis not present

## 2017-05-15 DIAGNOSIS — N186 End stage renal disease: Secondary | ICD-10-CM | POA: Diagnosis not present

## 2017-05-15 DIAGNOSIS — D509 Iron deficiency anemia, unspecified: Secondary | ICD-10-CM | POA: Diagnosis not present

## 2017-05-15 DIAGNOSIS — R112 Nausea with vomiting, unspecified: Secondary | ICD-10-CM | POA: Diagnosis not present

## 2017-05-18 DIAGNOSIS — Z992 Dependence on renal dialysis: Secondary | ICD-10-CM | POA: Diagnosis not present

## 2017-05-18 DIAGNOSIS — N2581 Secondary hyperparathyroidism of renal origin: Secondary | ICD-10-CM | POA: Diagnosis not present

## 2017-05-18 DIAGNOSIS — D509 Iron deficiency anemia, unspecified: Secondary | ICD-10-CM | POA: Diagnosis not present

## 2017-05-18 DIAGNOSIS — N186 End stage renal disease: Secondary | ICD-10-CM | POA: Diagnosis not present

## 2017-05-18 DIAGNOSIS — R112 Nausea with vomiting, unspecified: Secondary | ICD-10-CM | POA: Diagnosis not present

## 2017-05-20 DIAGNOSIS — N186 End stage renal disease: Secondary | ICD-10-CM | POA: Diagnosis not present

## 2017-05-20 DIAGNOSIS — R112 Nausea with vomiting, unspecified: Secondary | ICD-10-CM | POA: Diagnosis not present

## 2017-05-20 DIAGNOSIS — D509 Iron deficiency anemia, unspecified: Secondary | ICD-10-CM | POA: Diagnosis not present

## 2017-05-20 DIAGNOSIS — Z992 Dependence on renal dialysis: Secondary | ICD-10-CM | POA: Diagnosis not present

## 2017-05-20 DIAGNOSIS — N2581 Secondary hyperparathyroidism of renal origin: Secondary | ICD-10-CM | POA: Diagnosis not present

## 2017-05-22 DIAGNOSIS — N186 End stage renal disease: Secondary | ICD-10-CM | POA: Diagnosis not present

## 2017-05-22 DIAGNOSIS — N2581 Secondary hyperparathyroidism of renal origin: Secondary | ICD-10-CM | POA: Diagnosis not present

## 2017-05-22 DIAGNOSIS — R112 Nausea with vomiting, unspecified: Secondary | ICD-10-CM | POA: Diagnosis not present

## 2017-05-22 DIAGNOSIS — D509 Iron deficiency anemia, unspecified: Secondary | ICD-10-CM | POA: Diagnosis not present

## 2017-05-22 DIAGNOSIS — Z992 Dependence on renal dialysis: Secondary | ICD-10-CM | POA: Diagnosis not present

## 2017-05-25 ENCOUNTER — Encounter: Payer: Self-pay | Admitting: Family Medicine

## 2017-05-25 DIAGNOSIS — N2581 Secondary hyperparathyroidism of renal origin: Secondary | ICD-10-CM | POA: Diagnosis not present

## 2017-05-25 DIAGNOSIS — Z992 Dependence on renal dialysis: Secondary | ICD-10-CM | POA: Diagnosis not present

## 2017-05-25 DIAGNOSIS — N186 End stage renal disease: Secondary | ICD-10-CM | POA: Diagnosis not present

## 2017-05-25 DIAGNOSIS — D509 Iron deficiency anemia, unspecified: Secondary | ICD-10-CM | POA: Diagnosis not present

## 2017-05-25 DIAGNOSIS — R112 Nausea with vomiting, unspecified: Secondary | ICD-10-CM | POA: Diagnosis not present

## 2017-05-28 ENCOUNTER — Ambulatory Visit (INDEPENDENT_AMBULATORY_CARE_PROVIDER_SITE_OTHER): Payer: Medicare Other | Admitting: Family Medicine

## 2017-05-28 ENCOUNTER — Encounter: Payer: Self-pay | Admitting: Family Medicine

## 2017-05-28 VITALS — BP 140/86 | HR 89 | Temp 97.6°F | Resp 16 | Ht 71.0 in | Wt 269.0 lb

## 2017-05-28 DIAGNOSIS — M62830 Muscle spasm of back: Secondary | ICD-10-CM | POA: Diagnosis not present

## 2017-05-28 DIAGNOSIS — R8290 Unspecified abnormal findings in urine: Secondary | ICD-10-CM

## 2017-05-28 DIAGNOSIS — N186 End stage renal disease: Secondary | ICD-10-CM

## 2017-05-28 DIAGNOSIS — M546 Pain in thoracic spine: Secondary | ICD-10-CM | POA: Diagnosis not present

## 2017-05-28 DIAGNOSIS — M542 Cervicalgia: Secondary | ICD-10-CM

## 2017-05-28 DIAGNOSIS — Z992 Dependence on renal dialysis: Secondary | ICD-10-CM

## 2017-05-28 LAB — POCT URINALYSIS DIPSTICK
BILIRUBIN UA: NEGATIVE
GLUCOSE UA: NEGATIVE
KETONES UA: NEGATIVE
Nitrite, UA: NEGATIVE
Spec Grav, UA: 1.02 (ref 1.010–1.025)
Urobilinogen, UA: 0.2 E.U./dL
pH, UA: 7.5 (ref 5.0–8.0)

## 2017-05-28 MED ORDER — METHOCARBAMOL 500 MG PO TABS: 500.0000 mg | ORAL_TABLET | Freq: Two times a day (BID) | ORAL | 0 refills | Status: DC | PRN

## 2017-05-28 MED ORDER — SEVELAMER CARBONATE 800 MG PO TABS: 800.0000 mg | ORAL_TABLET | Freq: Two times a day (BID) | ORAL | 5 refills | Status: DC | PRN
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
## 2017-05-29 ENCOUNTER — Other Ambulatory Visit: Payer: Self-pay | Admitting: Urology

## 2017-05-29 DIAGNOSIS — Z992 Dependence on renal dialysis: Secondary | ICD-10-CM | POA: Diagnosis not present

## 2017-05-29 DIAGNOSIS — D3000 Benign neoplasm of unspecified kidney: Secondary | ICD-10-CM

## 2017-05-29 DIAGNOSIS — N186 End stage renal disease: Secondary | ICD-10-CM | POA: Diagnosis not present

## 2017-05-29 DIAGNOSIS — R112 Nausea with vomiting, unspecified: Secondary | ICD-10-CM | POA: Diagnosis not present

## 2017-05-29 DIAGNOSIS — D509 Iron deficiency anemia, unspecified: Secondary | ICD-10-CM | POA: Diagnosis not present

## 2017-05-29 DIAGNOSIS — N2581 Secondary hyperparathyroidism of renal origin: Secondary | ICD-10-CM | POA: Diagnosis not present

## 2017-05-30 LAB — URINE CULTURE: ORGANISM ID, BACTERIA: NO GROWTH

## 2017-06-01 ENCOUNTER — Other Ambulatory Visit: Payer: Self-pay | Admitting: Family Medicine

## 2017-06-01 ENCOUNTER — Telehealth: Payer: Self-pay

## 2017-06-01 DIAGNOSIS — Z992 Dependence on renal dialysis: Secondary | ICD-10-CM | POA: Diagnosis not present

## 2017-06-01 DIAGNOSIS — N186 End stage renal disease: Secondary | ICD-10-CM | POA: Diagnosis not present

## 2017-06-01 DIAGNOSIS — M62830 Muscle spasm of back: Secondary | ICD-10-CM

## 2017-06-01 DIAGNOSIS — R112 Nausea with vomiting, unspecified: Secondary | ICD-10-CM | POA: Diagnosis not present

## 2017-06-01 DIAGNOSIS — D509 Iron deficiency anemia, unspecified: Secondary | ICD-10-CM | POA: Diagnosis not present

## 2017-06-01 DIAGNOSIS — N2581 Secondary hyperparathyroidism of renal origin: Secondary | ICD-10-CM | POA: Diagnosis not present

## 2017-06-01 MED ORDER — TIZANIDINE HCL 4 MG PO TABS: 4.0000 mg | ORAL_TABLET | Freq: Three times a day (TID) | ORAL | 0 refills | Status: DC | PRN

## 2017-06-01 NOTE — Progress Notes (Signed)
Robaxin is not covered by patient's insurance. Will order tizanidine 4 mg every 8 hours as needed.   [name]  MSN, FNP-C [facility] [address] [place], [place] [address] [phone]

## 2017-06-01 NOTE — Telephone Encounter (Signed)
-----   Message from Dorena Dew, [place] sent at 06/01/2017  5:33 AM EDT ----- Regarding: lab results Please inform patient that urine culture is negative. No further work up warranted at this time.   [name]  MSN, FNP-C [facility] [address] [place], [place] [address] [phone]

## 2017-06-01 NOTE — Telephone Encounter (Signed)
Patient notified

## 2017-06-02 ENCOUNTER — Ambulatory Visit (HOSPITAL_COMMUNITY)
Admission: RE | Admit: 2017-06-02 | Discharge: 2017-06-02 | Disposition: A | Discharge status: Home / Self Care | Payer: Medicare Other | Source: Ambulatory Visit | Attending: Urology | Admitting: Urology

## 2017-06-02 DIAGNOSIS — D3002 Benign neoplasm of left kidney: Secondary | ICD-10-CM | POA: Insufficient documentation

## 2017-06-02 DIAGNOSIS — D3000 Benign neoplasm of unspecified kidney: Secondary | ICD-10-CM

## 2017-06-02 DIAGNOSIS — Z992 Dependence on renal dialysis: Secondary | ICD-10-CM | POA: Diagnosis not present

## 2017-06-02 DIAGNOSIS — D3001 Benign neoplasm of right kidney: Secondary | ICD-10-CM | POA: Diagnosis not present

## 2017-06-02 DIAGNOSIS — N186 End stage renal disease: Secondary | ICD-10-CM | POA: Diagnosis not present

## 2017-06-02 DIAGNOSIS — N281 Cyst of kidney, acquired: Secondary | ICD-10-CM | POA: Diagnosis not present

## 2017-06-02 IMAGING — MR MR ABDOMEN W/O CM
5 of 10 series · 22 of 48 positions shown · non-contrast
Comparison: 11/01/2015 CT abdomen/pelvis.

CLINICAL DATA: Incidental indeterminate hypodense renal lesions on
recent unenhanced CT study performed in the setting of rectal pain
and bleeding. Dialysis patient.

EXAM:
MRI ABDOMEN WITHOUT CONTRAST
TECHNIQUE: Multiplanar multisequence MR imaging was performed without the
administration of intravenous contrast.

[Series 3: cor ssfse · coronal · 6.0mm · 0.90mm/px · 4 of 42 slices shown]
[im 1/42]
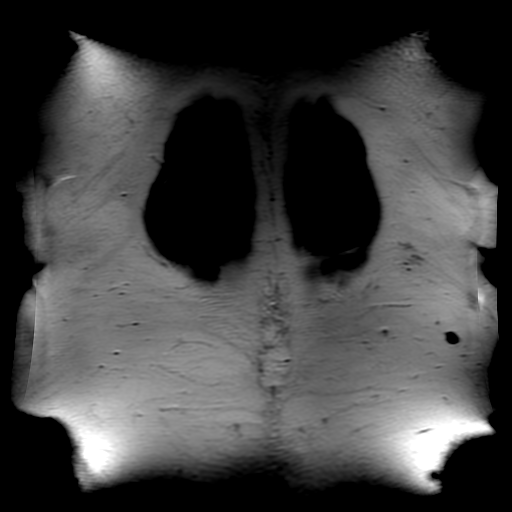
[im 14/42]
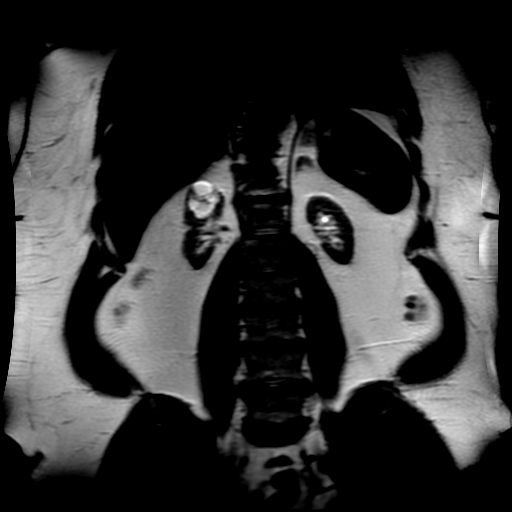
[im 28/42]
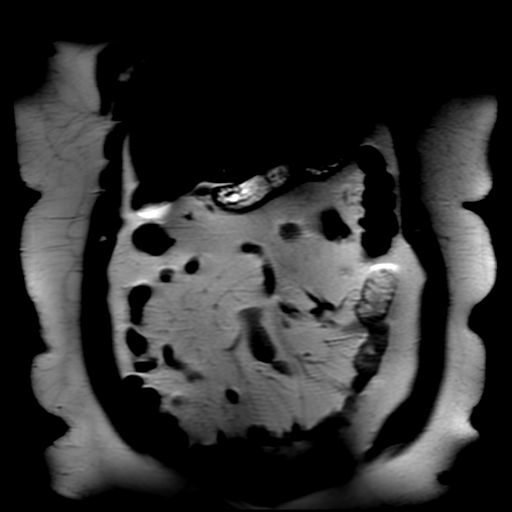
[im 42/42]
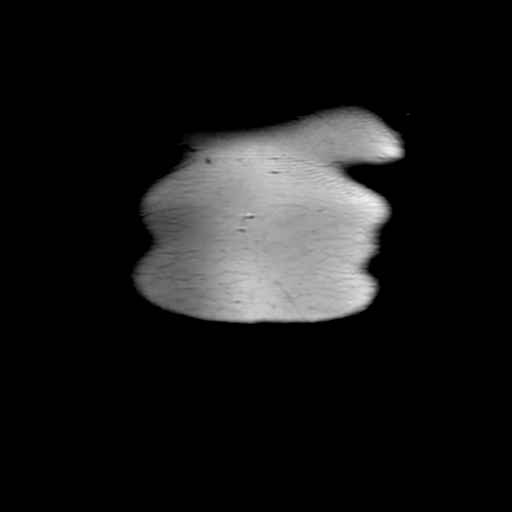

[Series 5: DWI b500 · axial · 8.0mm · 1.95mm/px · z∈[-147,+163]mm · 6 of 64 slices shown]
[im 1/64]
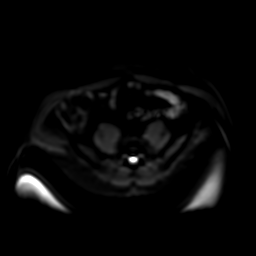
[im 13/64]
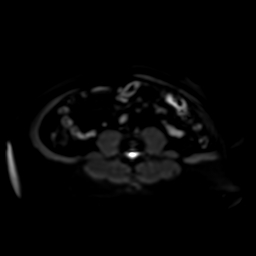
[im 26/64]
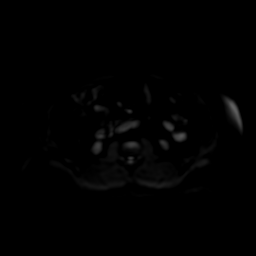
[im 38/64]
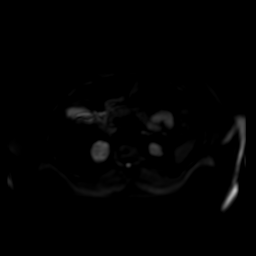
[im 51/64]
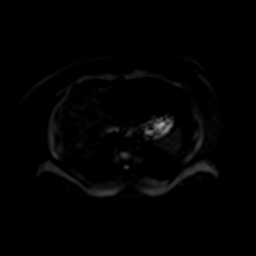
[im 64/64]
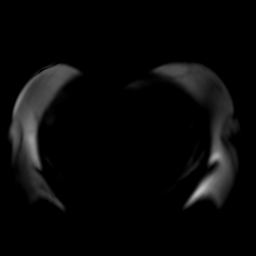

[Series 6: T2 fat-sat · axial · 6.0mm · 0.90mm/px · z∈[-115,+161]mm · 4 of 47 slices shown]
[im 1/47]
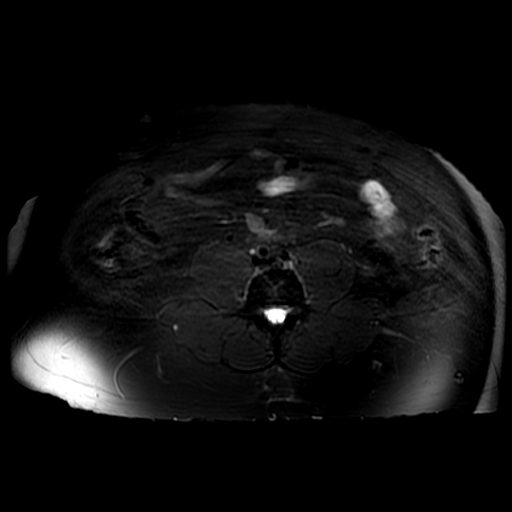
[im 16/47]
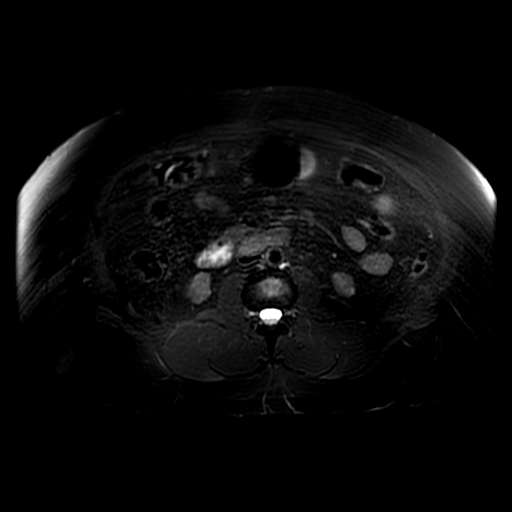
[im 31/47]
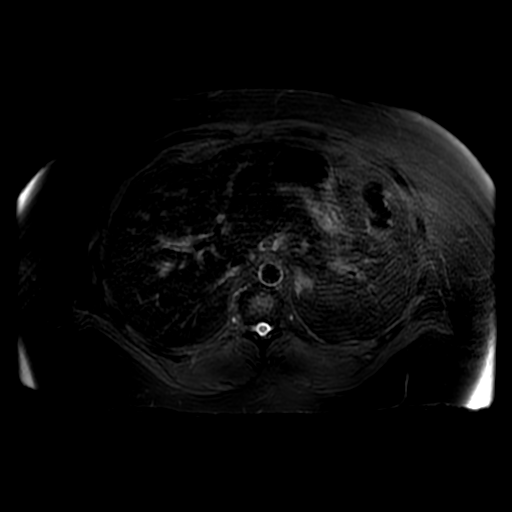
[im 47/47]
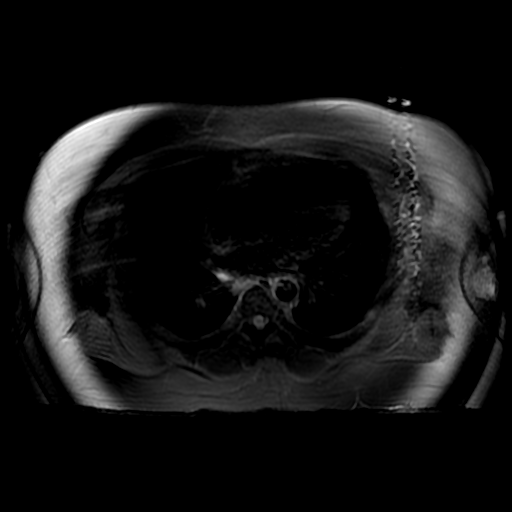

[Series 7: ax ssfse · axial · 6.0mm · 0.90mm/px · z∈[-115,+161]mm · 4 of 47 slices shown]
[im 1/47]
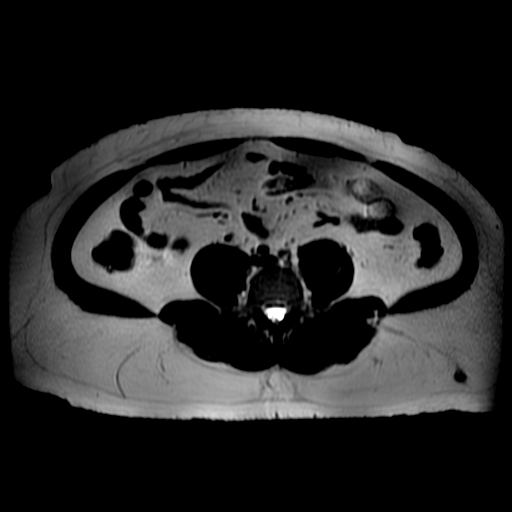
[im 16/47]
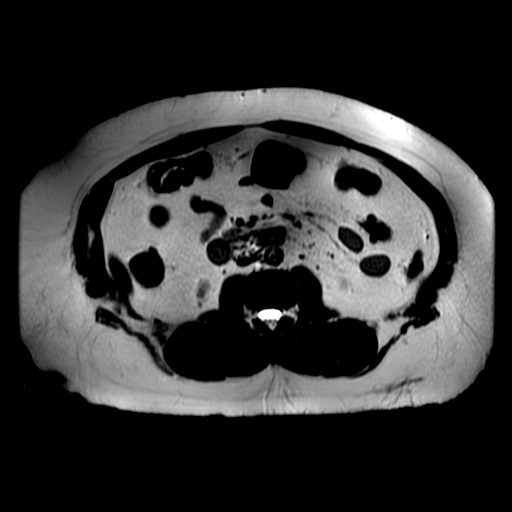
[im 31/47]
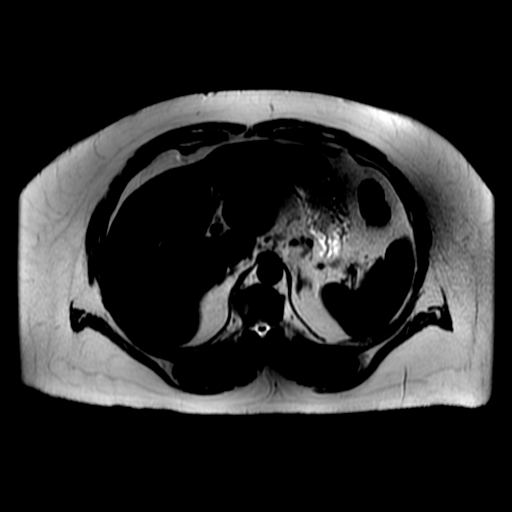
[im 47/47]
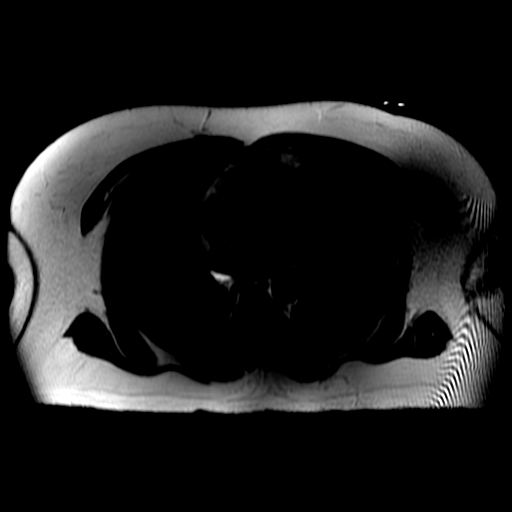

[Series 11: bSSFP · axial · 6.0mm · 0.94mm/px · z∈[-115,+155]mm · 4 of 46 slices shown]
[im 1/46]
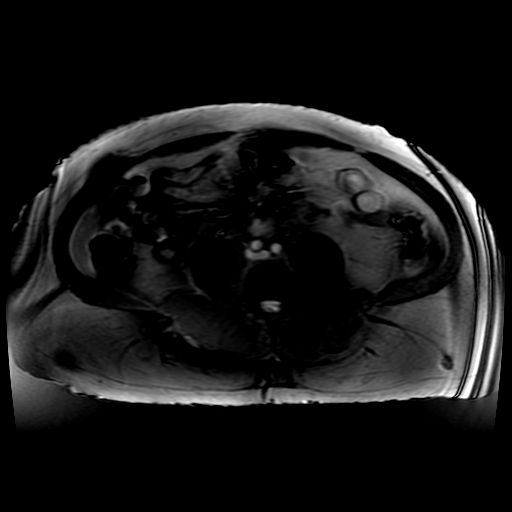
[im 16/46]
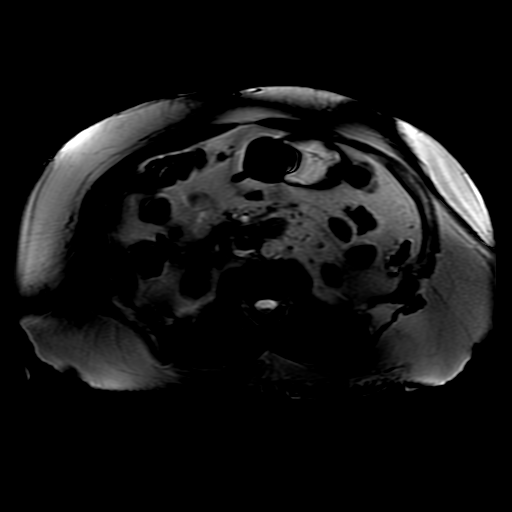
[im 31/46]
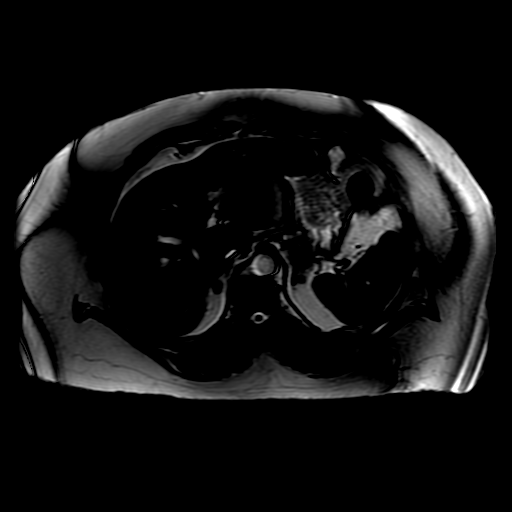
[im 46/46]
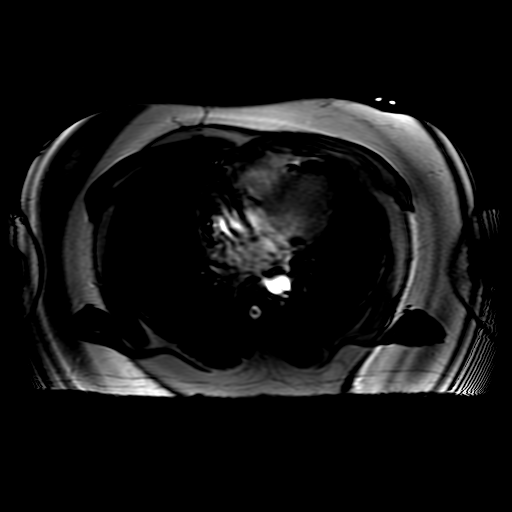

[22 of 48 positions shown; findings below may reference images not displayed]

FINDINGS: Lower chest: Clear lung bases.

Hepatobiliary: Normal liver size and configuration. No hepatic
steatosis. The T2 liver parenchymal signal intensity is low, which
may indicate diffuse hepatic iron deposition. There is a 0.6 cm
segment 4A left liver lobe T2 hyperintense T1 hyperintense lesion,
incompletely characterized on this noncontrast study, stable since
11/01/2015. No additional liver lesions. Normal gallbladder with no
cholelithiasis. No biliary ductal dilatation. Common bile duct
diameter 3 mm. No choledocholithiasis.

Pancreas: No pancreatic mass or duct dilation.  No pancreas divisum.

Spleen: Normal size. No mass.

Adrenals/Urinary Tract: Normal adrenals. No hydronephrosis. There is
symmetric atrophy of the kidneys. There are three similar-appearing
complex renal cortical masses, each demonstrating mixed T2 hypo and
hyperintensity and mixed T1 hypo and hyperintensity, as follows:

- upper right kidney 3.4 x 2.8 cm mass (series 7/image 23)

- exophytic medial far upper right kidney 1.8 x 1.8 cm mass (series
7/image 20)

- anterior interpolar left kidney 1.7 x 1.6 cm mass (series 7/image
26)

There are two additional subcentimeter T1 hyperintense renal
cortical lesions in the posterior lower right kidney and anterior
lower left kidney.

Stomach/Bowel: Grossly normal stomach. Visualized small and large
bowel is normal caliber, with no bowel wall thickening.

Vascular/Lymphatic: Normal caliber abdominal aorta. No
pathologically enlarged lymph nodes in the abdomen.

Other: No abdominal ascites or focal fluid collection.

Musculoskeletal: No aggressive appearing focal osseous lesions.
IMPRESSION: 1. Atrophic kidneys. Indeterminate similar-appearing complex
bilateral renal cortical masses demonstrating mixed signal
intensity, largest 3.4 cm in the upper right kidney, incompletely
evaluated on this noncontrast study, which appear new since the
noncontrast 05/07/2010 unenhanced CT abdomen/pelvis study.
Differential includes benign hemorrhagic/proteinaceous renal cysts
and/or renal cell carcinoma. Consider further evaluation with renal
protocol CT of the abdomen without and with IV contrast arranged in
conjunction with dialysis schedule. If this is not clinically
feasible, a follow-up MRI of the abdomen is recommended in 3-6
months to assess stability.
2. No evidence of metastatic disease in the abdomen.
3. Suggestion of hepatic iron deposition, which could be
transfusion-related.

## 2017-06-03 ENCOUNTER — Ambulatory Visit: Payer: Self-pay | Admitting: Urology

## 2017-06-03 DIAGNOSIS — N2581 Secondary hyperparathyroidism of renal origin: Secondary | ICD-10-CM | POA: Diagnosis not present

## 2017-06-03 DIAGNOSIS — N186 End stage renal disease: Secondary | ICD-10-CM | POA: Diagnosis not present

## 2017-06-03 DIAGNOSIS — D509 Iron deficiency anemia, unspecified: Secondary | ICD-10-CM | POA: Diagnosis not present

## 2017-06-03 DIAGNOSIS — Z992 Dependence on renal dialysis: Secondary | ICD-10-CM | POA: Diagnosis not present

## 2017-06-03 DIAGNOSIS — R112 Nausea with vomiting, unspecified: Secondary | ICD-10-CM | POA: Diagnosis not present

## 2017-06-05 ENCOUNTER — Other Ambulatory Visit: Payer: Self-pay | Admitting: Family Medicine

## 2017-06-05 DIAGNOSIS — R110 Nausea: Secondary | ICD-10-CM

## 2017-06-05 MED ORDER — ONDANSETRON HCL 4 MG PO TABS: 4.0000 mg | ORAL_TABLET | Freq: Three times a day (TID) | ORAL | 0 refills | Status: DC | PRN

## 2017-06-05 NOTE — Progress Notes (Signed)
Meds ordered this encounter  Medications  . ondansetron (ZOFRAN) 4 MG tablet    Sig: Take 1 tablet (4 mg total) by mouth every 8 (eight) hours as needed for nausea or vomiting.    Dispense:  20 tablet    Refill:  0    [name]  MSN, FNP-C [facility] [address] [place], [place] [address] [phone]

## 2017-06-06 DIAGNOSIS — N2581 Secondary hyperparathyroidism of renal origin: Secondary | ICD-10-CM | POA: Diagnosis not present

## 2017-06-06 DIAGNOSIS — N186 End stage renal disease: Secondary | ICD-10-CM | POA: Diagnosis not present

## 2017-06-06 DIAGNOSIS — Z992 Dependence on renal dialysis: Secondary | ICD-10-CM | POA: Diagnosis not present

## 2017-06-06 DIAGNOSIS — R112 Nausea with vomiting, unspecified: Secondary | ICD-10-CM | POA: Diagnosis not present

## 2017-06-06 DIAGNOSIS — D509 Iron deficiency anemia, unspecified: Secondary | ICD-10-CM | POA: Diagnosis not present

## 2017-06-08 ENCOUNTER — Telehealth (HOSPITAL_COMMUNITY): Payer: Self-pay | Admitting: Family Medicine

## 2017-06-08 DIAGNOSIS — N186 End stage renal disease: Secondary | ICD-10-CM | POA: Diagnosis not present

## 2017-06-08 DIAGNOSIS — D509 Iron deficiency anemia, unspecified: Secondary | ICD-10-CM | POA: Diagnosis not present

## 2017-06-08 DIAGNOSIS — N2581 Secondary hyperparathyroidism of renal origin: Secondary | ICD-10-CM | POA: Diagnosis not present

## 2017-06-08 DIAGNOSIS — R112 Nausea with vomiting, unspecified: Secondary | ICD-10-CM | POA: Diagnosis not present

## 2017-06-08 DIAGNOSIS — Z992 Dependence on renal dialysis: Secondary | ICD-10-CM | POA: Diagnosis not present

## 2017-06-08 NOTE — Telephone Encounter (Signed)
06/08/17  pt left a message to cx and said he did want to reschedule

## 2017-06-09 ENCOUNTER — Ambulatory Visit (HOSPITAL_COMMUNITY): Payer: Medicare Other

## 2017-06-12 DIAGNOSIS — N186 End stage renal disease: Secondary | ICD-10-CM | POA: Diagnosis not present

## 2017-06-12 DIAGNOSIS — Z992 Dependence on renal dialysis: Secondary | ICD-10-CM | POA: Diagnosis not present

## 2017-06-12 DIAGNOSIS — D509 Iron deficiency anemia, unspecified: Secondary | ICD-10-CM | POA: Diagnosis not present

## 2017-06-12 DIAGNOSIS — R112 Nausea with vomiting, unspecified: Secondary | ICD-10-CM | POA: Diagnosis not present

## 2017-06-12 DIAGNOSIS — N2581 Secondary hyperparathyroidism of renal origin: Secondary | ICD-10-CM | POA: Diagnosis not present

## 2017-06-15 DIAGNOSIS — Z992 Dependence on renal dialysis: Secondary | ICD-10-CM | POA: Diagnosis not present

## 2017-06-15 DIAGNOSIS — N186 End stage renal disease: Secondary | ICD-10-CM | POA: Diagnosis not present

## 2017-06-15 DIAGNOSIS — N2581 Secondary hyperparathyroidism of renal origin: Secondary | ICD-10-CM | POA: Diagnosis not present

## 2017-06-15 DIAGNOSIS — D509 Iron deficiency anemia, unspecified: Secondary | ICD-10-CM | POA: Diagnosis not present

## 2017-06-15 DIAGNOSIS — R112 Nausea with vomiting, unspecified: Secondary | ICD-10-CM | POA: Diagnosis not present

## 2017-06-17 ENCOUNTER — Ambulatory Visit (INDEPENDENT_AMBULATORY_CARE_PROVIDER_SITE_OTHER): Payer: Medicare Other | Admitting: Urology

## 2017-06-17 DIAGNOSIS — R310 Gross hematuria: Secondary | ICD-10-CM | POA: Diagnosis not present

## 2017-06-17 DIAGNOSIS — N281 Cyst of kidney, acquired: Secondary | ICD-10-CM | POA: Diagnosis not present

## 2017-06-17 DIAGNOSIS — R102 Pelvic and perineal pain: Secondary | ICD-10-CM

## 2017-06-17 DIAGNOSIS — N186 End stage renal disease: Secondary | ICD-10-CM | POA: Diagnosis not present

## 2017-06-17 DIAGNOSIS — N2581 Secondary hyperparathyroidism of renal origin: Secondary | ICD-10-CM | POA: Diagnosis not present

## 2017-06-17 DIAGNOSIS — D509 Iron deficiency anemia, unspecified: Secondary | ICD-10-CM | POA: Diagnosis not present

## 2017-06-17 DIAGNOSIS — R112 Nausea with vomiting, unspecified: Secondary | ICD-10-CM | POA: Diagnosis not present

## 2017-06-17 DIAGNOSIS — Z992 Dependence on renal dialysis: Secondary | ICD-10-CM | POA: Diagnosis not present

## 2017-06-18 ENCOUNTER — Other Ambulatory Visit: Payer: Self-pay

## 2017-06-18 ENCOUNTER — Ambulatory Visit (HOSPITAL_COMMUNITY): Payer: Medicare Other | Attending: Family Medicine | Admitting: Physical Therapy

## 2017-06-18 ENCOUNTER — Encounter (HOSPITAL_COMMUNITY): Payer: Self-pay | Admitting: Physical Therapy

## 2017-06-18 DIAGNOSIS — M542 Cervicalgia: Secondary | ICD-10-CM | POA: Diagnosis not present
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
## 2017-06-18 NOTE — Patient Instructions (Addendum)
Strengthening: Shoulder Shrug (Phase 1)    Shrug shoulders up and down, forward and backward. Repeat __5__ times per set. Do __1__ sets per session. Do ___2_ sessions per day.  http://orth.exer.us/336   Copyright  VHI. All rights reserved.  Scapular Retraction (Standing)    With arms at sides, pinch shoulder blades together. Repeat ___10_ times per set. Do _1___ sets per session. Do _2___ sessions per day.  http://orth.exer.us/944   Copyright  VHI. All rights reserved.

## 2017-06-19 DIAGNOSIS — N186 End stage renal disease: Secondary | ICD-10-CM | POA: Diagnosis not present

## 2017-06-19 DIAGNOSIS — D509 Iron deficiency anemia, unspecified: Secondary | ICD-10-CM | POA: Diagnosis not present

## 2017-06-19 DIAGNOSIS — R112 Nausea with vomiting, unspecified: Secondary | ICD-10-CM | POA: Diagnosis not present

## 2017-06-19 DIAGNOSIS — N2581 Secondary hyperparathyroidism of renal origin: Secondary | ICD-10-CM | POA: Diagnosis not present

## 2017-06-19 DIAGNOSIS — Z992 Dependence on renal dialysis: Secondary | ICD-10-CM | POA: Diagnosis not present

## 2017-06-22 DIAGNOSIS — T82868A Thrombosis of vascular prosthetic devices, implants and grafts, initial encounter: Secondary | ICD-10-CM | POA: Diagnosis not present

## 2017-06-22 DIAGNOSIS — N186 End stage renal disease: Secondary | ICD-10-CM | POA: Diagnosis not present

## 2017-06-22 DIAGNOSIS — Z992 Dependence on renal dialysis: Secondary | ICD-10-CM | POA: Diagnosis not present

## 2017-06-22 DIAGNOSIS — I871 Compression of vein: Secondary | ICD-10-CM | POA: Diagnosis not present

## 2017-06-23 DIAGNOSIS — N2581 Secondary hyperparathyroidism of renal origin: Secondary | ICD-10-CM | POA: Diagnosis not present

## 2017-06-23 DIAGNOSIS — Z992 Dependence on renal dialysis: Secondary | ICD-10-CM | POA: Diagnosis not present

## 2017-06-23 DIAGNOSIS — D509 Iron deficiency anemia, unspecified: Secondary | ICD-10-CM | POA: Diagnosis not present

## 2017-06-23 DIAGNOSIS — N186 End stage renal disease: Secondary | ICD-10-CM | POA: Diagnosis not present

## 2017-06-23 DIAGNOSIS — R112 Nausea with vomiting, unspecified: Secondary | ICD-10-CM | POA: Diagnosis not present

## 2017-06-24 DIAGNOSIS — N2581 Secondary hyperparathyroidism of renal origin: Secondary | ICD-10-CM | POA: Diagnosis not present

## 2017-06-24 DIAGNOSIS — N186 End stage renal disease: Secondary | ICD-10-CM | POA: Diagnosis not present

## 2017-06-24 DIAGNOSIS — R112 Nausea with vomiting, unspecified: Secondary | ICD-10-CM | POA: Diagnosis not present

## 2017-06-24 DIAGNOSIS — Z992 Dependence on renal dialysis: Secondary | ICD-10-CM | POA: Diagnosis not present

## 2017-06-24 DIAGNOSIS — D509 Iron deficiency anemia, unspecified: Secondary | ICD-10-CM | POA: Diagnosis not present

## 2017-06-25 ENCOUNTER — Ambulatory Visit (HOSPITAL_COMMUNITY): Payer: Medicare Other

## 2017-06-25 ENCOUNTER — Telehealth (HOSPITAL_COMMUNITY): Payer: Self-pay

## 2017-06-25 NOTE — Telephone Encounter (Signed)
He has anotehr apptment today

## 2017-06-26 DIAGNOSIS — Z992 Dependence on renal dialysis: Secondary | ICD-10-CM | POA: Diagnosis not present

## 2017-06-26 DIAGNOSIS — R112 Nausea with vomiting, unspecified: Secondary | ICD-10-CM | POA: Diagnosis not present

## 2017-06-26 DIAGNOSIS — N2581 Secondary hyperparathyroidism of renal origin: Secondary | ICD-10-CM | POA: Diagnosis not present

## 2017-06-26 DIAGNOSIS — N186 End stage renal disease: Secondary | ICD-10-CM | POA: Diagnosis not present

## 2017-06-26 DIAGNOSIS — D509 Iron deficiency anemia, unspecified: Secondary | ICD-10-CM | POA: Diagnosis not present

## 2017-06-29 DIAGNOSIS — N186 End stage renal disease: Secondary | ICD-10-CM | POA: Diagnosis not present

## 2017-06-29 DIAGNOSIS — D509 Iron deficiency anemia, unspecified: Secondary | ICD-10-CM | POA: Diagnosis not present

## 2017-06-29 DIAGNOSIS — R112 Nausea with vomiting, unspecified: Secondary | ICD-10-CM | POA: Diagnosis not present

## 2017-06-29 DIAGNOSIS — Z992 Dependence on renal dialysis: Secondary | ICD-10-CM | POA: Diagnosis not present

## 2017-06-29 DIAGNOSIS — N2581 Secondary hyperparathyroidism of renal origin: Secondary | ICD-10-CM | POA: Diagnosis not present

## 2017-07-01 DIAGNOSIS — R112 Nausea with vomiting, unspecified: Secondary | ICD-10-CM | POA: Diagnosis not present

## 2017-07-01 DIAGNOSIS — N2581 Secondary hyperparathyroidism of renal origin: Secondary | ICD-10-CM | POA: Diagnosis not present

## 2017-07-01 DIAGNOSIS — N186 End stage renal disease: Secondary | ICD-10-CM | POA: Diagnosis not present

## 2017-07-01 DIAGNOSIS — Z992 Dependence on renal dialysis: Secondary | ICD-10-CM | POA: Diagnosis not present

## 2017-07-01 DIAGNOSIS — D509 Iron deficiency anemia, unspecified: Secondary | ICD-10-CM | POA: Diagnosis not present

## 2017-07-02 ENCOUNTER — Telehealth (HOSPITAL_COMMUNITY): Payer: Self-pay | Admitting: Family Medicine

## 2017-07-02 ENCOUNTER — Ambulatory Visit (HOSPITAL_COMMUNITY): Payer: Medicare Other

## 2017-07-02 NOTE — Telephone Encounter (Signed)
07/02/17  called and cx said something came up today

## 2017-07-03 DIAGNOSIS — R112 Nausea with vomiting, unspecified: Secondary | ICD-10-CM | POA: Diagnosis not present

## 2017-07-03 DIAGNOSIS — N2581 Secondary hyperparathyroidism of renal origin: Secondary | ICD-10-CM | POA: Diagnosis not present

## 2017-07-03 DIAGNOSIS — N186 End stage renal disease: Secondary | ICD-10-CM | POA: Diagnosis not present

## 2017-07-03 DIAGNOSIS — Z992 Dependence on renal dialysis: Secondary | ICD-10-CM | POA: Diagnosis not present

## 2017-07-03 DIAGNOSIS — D509 Iron deficiency anemia, unspecified: Secondary | ICD-10-CM | POA: Diagnosis not present

## 2017-07-06 DIAGNOSIS — N186 End stage renal disease: Secondary | ICD-10-CM | POA: Diagnosis not present

## 2017-07-06 DIAGNOSIS — D509 Iron deficiency anemia, unspecified: Secondary | ICD-10-CM | POA: Diagnosis not present

## 2017-07-06 DIAGNOSIS — Z992 Dependence on renal dialysis: Secondary | ICD-10-CM | POA: Diagnosis not present

## 2017-07-06 DIAGNOSIS — N2581 Secondary hyperparathyroidism of renal origin: Secondary | ICD-10-CM | POA: Diagnosis not present

## 2017-07-07 ENCOUNTER — Encounter (HOSPITAL_COMMUNITY): Payer: Self-pay

## 2017-07-07 ENCOUNTER — Ambulatory Visit (HOSPITAL_COMMUNITY): Payer: Medicare Other | Attending: Family Medicine

## 2017-07-07 DIAGNOSIS — M542 Cervicalgia: Secondary | ICD-10-CM

## 2017-07-07 NOTE — Therapy (Signed)
[place], [place], [address] Phone: [phone]   Fax:  [phone]  Physical Therapy Treatment  Patient Details  Name: ROBB SIBAL MRN: [id number] Date of Birth: 1965-04-01 Referring Provider: Cammie Sickle    Encounter Date: 07/07/2017  PT End of Session - 07/07/17 1444    Visit Number  2    Number of Visits  6    Date for PT Re-Evaluation  07/18/17    Authorization Type  medicare/caid    Authorization Time Period  5/16-6/15/19    PT Start Time  1440    PT Stop Time  1504 pt requested to leave early    PT Time Calculation (min)  24 min    Activity Tolerance  Patient tolerated treatment well    Behavior During Therapy  [facility] for tasks assessed/performed pt easily distracted required cueing to stay on task       Past Medical History:  Diagnosis Date  . Anemia   . Arthritis   . BPH (benign prostatic hyperplasia)   . Chronic back pain   . Difficulty walking   . DVT (deep venous thrombosis) ([place])    Right popliteal DVT December 2017  . End-stage renal disease on hemodialysis ([place])    Dr. Lowanda Foster  . Erectile dysfunction   . Essential hypertension   . Gout   . Neuropathy, diabetic ([place])   . Type 2 diabetes mellitus ([place])   . Venous (peripheral) insufficiency     Past Surgical History:  Procedure Laterality Date  . AV FISTULA PLACEMENT Left 03/09/2014   Procedure: INSERTION OF ARTERIOVENOUS (AV) GORE-TEX GRAFT ARM;  Surgeon: Angelia Mould, MD;  Location: [facility] OR;  Service: Vascular;  Laterality: Left;  . AV FISTULA PLACEMENT Right 05/06/2016   Procedure: INSERTION OF ARTERIOVENOUS (AV) GORE-TEX GRAFT  Right ARM;  Surgeon: Elam Dutch, MD;  Location: [place];  Service: Vascular;  Laterality: Right;  . CYST EXCISION Right    cyst removed on thumb 2001  . INSERTION OF DIALYSIS CATHETER    . INSERTION OF DIALYSIS CATHETER Left 03/09/2014   Procedure: INSERTION OF DIALYSIS CATHETER;  Surgeon:  Angelia Mould, MD;  Location: [place];  Service: Vascular;  Laterality: Left;  . IR THROMBECTOMY AV FISTULA W/THROMBOLYSIS/PTA INC/SHUNT/IMG RIGHT Right 11/01/2016  . IR US GUIDE VASC ACCESS RIGHT  11/01/2016  . IRRIGATION AND DEBRIDEMENT ABSCESS N/A 02/25/2016   Procedure: IRRIGATION AND DEBRIDEMENT SCROTAL ABSCESS;  Surgeon: Nickie Retort, MD;  Location: [facility];  Service: Urology;  Laterality: N/A;    There were no vitals filed for this visit.  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  Pt stated he has some cervical pain posterior, pain scale 5-6/10.    Pertinent History  End stage renal failure, DM, HTN     Currently in Pain?  Yes    Pain Score  5     Pain Location  Neck    Pain Orientation  Posterior    Pain Descriptors / Indicators  Sore    Pain Type  Acute pain    Pain Onset  More than a month ago    Pain Frequency  Constant    Aggravating Factors   Pt states the mornings are worse as well as lying down    Pain Relieving Factors  rest    Effect  of Pain on Daily Activities  limits                       [facility] Adult PT Treatment/Exercise - 07/07/17 0001      Posture/Postural Control   Postural Limitations  Rounded Shoulders;Forward head;Flexed trunk      Neck Exercises: Seated   W Back  10 reps    Shoulder Rolls  Backwards;10 reps    Other Seated Exercise  scapular retraction 2`x 10     Other Seated Exercise  3 D cervical excursion x 5             PT Education - 07/07/17 1448    Education provided  Yes    Education Details  Reviewed goals and copy of eval given to pt.  Pt educated on importance of compliance with HEP for maximal benefits.    Person(s) Educated  Patient    Methods  Explanation;Demonstration;Handout    Comprehension  Verbalized understanding;Returned demonstration;Need further instruction          PT Long  Term Goals - 06/18/17 1614      PT LONG TERM GOAL #1   Title  PT to be able to rotate his neck 60 degrees to allow pt to freely turn his head in conversations without pain     Time  3    Period  Weeks    Status  New    Target Date  07/09/17      PT LONG TERM GOAL #2   Title  PT neck pain to be no greater than a 3/10 to allow patient to sleep without difficulty     Time  3    Period  Weeks    Status  New      PT LONG TERM GOAL #3   Title  Pt to be I in advance HEP to allow pt to return to a pain free level ( this will be completed on own)    Time  6    Period  Weeks    Status  New    Target Date  07/30/17            Plan - 07/07/17 1530    Clinical Impression Statement  Reviewed goals and copy of eval given to pt.  Pt unable to recall and reports he has not began HEP, pt educated on importance of compliance with HEP for maximal benefits.  Session focus on cervical mobility and postural strengthening.  Therapist facilitation to assure correct form with majority of exericses to utilize correct musculature.  Pt easily distracted and required cueing to stay on task.  Pt educated on importance of posture to reduce stress on cervical muscualture, verbalized understanding.      Rehab Potential  Good    PT Frequency  2x / week    PT Duration  3 weeks    PT Treatment/Interventions  ADLs/Self Care Home Management;Therapeutic activities;Therapeutic exercise;Manual techniques    PT Next Visit Plan  F/U with HEP compliance.  Continue to progress cervical and thoracic mobility.  Progressed to wall arch and V lift offs    PT Home Exercise Plan  backward shoulder rolls and scapular retraction       Patient will benefit from skilled therapeutic intervention in order to improve the following deficits and impairments:  Increased fascial restricitons, Pain, Postural dysfunction, Decreased range of motion  Visit Diagnosis: Cervicalgia     Problem List Patient Active Problem List  Diagnosis Date Noted  . Onychomycosis 06/17/2016  . Constipation 03/05/2016  . Anemia in chronic kidney disease (CKD) 03/05/2016  . Bleeding from wound 03/03/2016  . Chronic anticoagulation 03/03/2016  . Scrotal abscess 02/25/2016  . Type 2 diabetes mellitus ([place]) 02/25/2016  . Acute venous embolism and thrombosis of deep vessels of proximal lower extremity ([place]) [I82.4Y9] 02/11/2016  . Encounter for therapeutic drug monitoring 02/11/2016  . Nausea & vomiting 11/17/2014  . Thrombocytopenia ([place]) 02/21/2014  . Morbid obesity ([place]) 03/30/2013  . RBBB 03/29/2013  . Hyperkalemia 03/28/2013  . Leukocytosis 03/28/2013  . Nausea vomiting and diarrhea 03/28/2013  . BBB (bundle branch block) 03/28/2013  . Hypertension   . ESRD on dialysis ([place])   . Lack of coordination 04/15/2012  . Muscle weakness (generalized) 04/15/2012  . Arthritis, gouty 04/15/2012  . Difficulty walking 08/18/2011  . Weakness of both legs 08/18/2011   Ihor Austin, LPTA; CBIS [phone]  Aldona Lento 07/07/2017, 3:40 PM  [place] [address] [place], [place], [address] Phone: [phone]   Fax:  [phone]  Name: DARTANYAN DEASIS MRN: [id number] Date of Birth: 31-Jul-1965

## 2017-07-08 DIAGNOSIS — N186 End stage renal disease: Secondary | ICD-10-CM | POA: Diagnosis not present

## 2017-07-08 DIAGNOSIS — N2581 Secondary hyperparathyroidism of renal origin: Secondary | ICD-10-CM | POA: Diagnosis not present

## 2017-07-08 DIAGNOSIS — D509 Iron deficiency anemia, unspecified: Secondary | ICD-10-CM | POA: Diagnosis not present

## 2017-07-08 DIAGNOSIS — Z992 Dependence on renal dialysis: Secondary | ICD-10-CM | POA: Diagnosis not present

## 2017-07-09 ENCOUNTER — Encounter (HOSPITAL_COMMUNITY): Payer: Self-pay | Admitting: Physical Therapy

## 2017-07-10 DIAGNOSIS — D509 Iron deficiency anemia, unspecified: Secondary | ICD-10-CM | POA: Diagnosis not present

## 2017-07-10 DIAGNOSIS — N186 End stage renal disease: Secondary | ICD-10-CM | POA: Diagnosis not present

## 2017-07-10 DIAGNOSIS — N2581 Secondary hyperparathyroidism of renal origin: Secondary | ICD-10-CM | POA: Diagnosis not present

## 2017-07-10 DIAGNOSIS — Z992 Dependence on renal dialysis: Secondary | ICD-10-CM | POA: Diagnosis not present

## 2017-07-13 DIAGNOSIS — N186 End stage renal disease: Secondary | ICD-10-CM | POA: Diagnosis not present

## 2017-07-13 DIAGNOSIS — D509 Iron deficiency anemia, unspecified: Secondary | ICD-10-CM | POA: Diagnosis not present

## 2017-07-13 DIAGNOSIS — N2581 Secondary hyperparathyroidism of renal origin: Secondary | ICD-10-CM | POA: Diagnosis not present

## 2017-07-13 DIAGNOSIS — Z992 Dependence on renal dialysis: Secondary | ICD-10-CM | POA: Diagnosis not present

## 2017-07-14 ENCOUNTER — Ambulatory Visit (HOSPITAL_COMMUNITY): Payer: Medicare Other

## 2017-07-14 DIAGNOSIS — M542 Cervicalgia: Secondary | ICD-10-CM | POA: Diagnosis not present

## 2017-07-14 NOTE — Therapy (Signed)
[place], [place], [address] Phone: [phone]   Fax:  [phone]  Physical Therapy Treatment  Patient Details  Name: Jack Irwin MRN: [id number] Date of Birth: [birth year] Referring Provider: Cammie Sickle    Encounter Date: 07/14/2017  PT End of Session - 07/14/17 1438    Visit Number  3    Number of Visits  6    Date for PT Re-Evaluation  07/18/17    Authorization Type  medicare/caid    Authorization Time Period  5/16-6/15/19    PT Start Time  1433    PT Stop Time  1508 pt requested to leave early today    PT Time Calculation (min)  35 min    Activity Tolerance  Patient tolerated treatment well;Patient limited by fatigue    Behavior During Therapy  [facility] for tasks assessed/performed pt easily distracted required cueing to stay on task       Past Medical History:  Diagnosis Date  . Anemia   . Arthritis   . BPH (benign prostatic hyperplasia)   . Chronic back pain   . Difficulty walking   . DVT (deep venous thrombosis) ([place])    Right popliteal DVT December 2017  . End-stage renal disease on hemodialysis ([place])    Dr. Lowanda Foster  . Erectile dysfunction   . Essential hypertension   . Gout   . Neuropathy, diabetic ([place])   . Type 2 diabetes mellitus ([place])   . Venous (peripheral) insufficiency     Past Surgical History:  Procedure Laterality Date  . AV FISTULA PLACEMENT Left 03/09/2014   Procedure: INSERTION OF ARTERIOVENOUS (AV) GORE-TEX GRAFT ARM;  Surgeon: Angelia Mould, MD;  Location: [facility] OR;  Service: Vascular;  Laterality: Left;  . AV FISTULA PLACEMENT Right 05/06/2016   Procedure: INSERTION OF ARTERIOVENOUS (AV) GORE-TEX GRAFT  Right ARM;  Surgeon: Elam Dutch, MD;  Location: [place];  Service: Vascular;  Laterality: Right;  . CYST EXCISION Right    cyst removed on thumb 2001  . INSERTION OF DIALYSIS CATHETER    . INSERTION OF DIALYSIS CATHETER Left 03/09/2014   Procedure: INSERTION  OF DIALYSIS CATHETER;  Surgeon: Angelia Mould, MD;  Location: [place];  Service: Vascular;  Laterality: Left;  . IR THROMBECTOMY AV FISTULA W/THROMBOLYSIS/PTA INC/SHUNT/IMG RIGHT Right 11/01/2016  . IR US GUIDE VASC ACCESS RIGHT  11/01/2016  . IRRIGATION AND DEBRIDEMENT ABSCESS N/A 02/25/2016   Procedure: IRRIGATION AND DEBRIDEMENT SCROTAL ABSCESS;  Surgeon: Nickie Retort, MD;  Location[facility];  Service: Urology;  Laterality: N/A;    There were no vitals filed for this visit.  Subjective Assessment - 07/14/17 1433    Subjective  Pt stated he is feeling good today, pain scale 3-4/10 tightness, achey sore pain.  Reports compliance with HEP.    Currently in Pain?  Yes    Pain Score  4     Pain Location  Neck    Pain Descriptors / Indicators  Sore    Pain Type  Acute pain    Pain Onset  More than a month ago    Pain Frequency  Constant    Aggravating Factors   Pt states the morning are worse as well as lying down    Pain Relieving Factors  rest    Effect of Pain on Daily Activities  limits  [facility] Adult PT Treatment/Exercise - 07/14/17 0001      Neck Exercises: Theraband   Shoulder Extension  10 reps;Red    Rows  10 reps;Red      Neck Exercises: Standing   Other Standing Exercises  Wall arch 10x    Other Standing Exercises  V arch 10x      Neck Exercises: Seated   W Back  15 reps    Money  5 reps    Shoulder Rolls  Backwards;10 reps    Other Seated Exercise  scapular retraction 2`x 10 2nd set w/ RTB    Other Seated Exercise  3 D cervical excursion x 5; thoracic excursion with arms crossed on chest 5x                  PT Long Term Goals - 06/18/17 1614      PT LONG TERM GOAL #1   Title  PT to be able to rotate his neck 60 degrees to allow pt to freely turn his head in conversations without pain     Time  3    Period  Weeks    Status  New    Target Date  07/09/17      PT LONG TERM GOAL #2   Title  PT neck pain to be  no greater than a 3/10 to allow patient to sleep without difficulty     Time  3    Period  Weeks    Status  New      PT LONG TERM GOAL #3   Title  Pt to be I in advance HEP to allow pt to return to a pain free level ( this will be completed on own)    Time  6    Period  Weeks    Status  New    Target Date  07/30/17            Plan - 07/14/17 1510    Clinical Impression Statement  Session focus on postural strengthening.  Added wall arch and theraband postural strengthening.  Therapist facilitation for proper form and cueing to reduce posterior lean with rest breaks.  No reports of pain through session, was limited by fatigue requiring seated rest breaks through session.  Pt was easily distracted, required cueing to stay on task.      Rehab Potential  Good    PT Frequency  2x / week    PT Duration  3 weeks    PT Treatment/Interventions  ADLs/Self Care Home Management;Therapeutic activities;Therapeutic exercise;Manual techniques    PT Next Visit Plan  Reassess next session.  Continue to progress cervical and thoracic mobility.  Add UBE next session.      PT Home Exercise Plan  backward shoulder rolls and scapular retraction; theraband rows and shoulder extension.         Patient will benefit from skilled therapeutic intervention in order to improve the following deficits and impairments:  Increased fascial restricitons, Pain, Postural dysfunction, Decreased range of motion  Visit Diagnosis: Cervicalgia     Problem List Patient Active Problem List   Diagnosis Date Noted  . Onychomycosis 06/17/2016  . Constipation 03/05/2016  . Anemia in chronic kidney disease (CKD) 03/05/2016  . Bleeding from wound 03/03/2016  . Chronic anticoagulation 03/03/2016  . Scrotal abscess 02/25/2016  . Type 2 diabetes mellitus ([place]) 02/25/2016  . Acute venous embolism and thrombosis of deep vessels of proximal lower extremity ([place]) [I82.4Y9] 02/11/2016  . Encounter for  therapeutic drug  monitoring 02/11/2016  . Nausea & vomiting 11/17/2014  . Thrombocytopenia ([place]) 02/21/2014  . Morbid obesity ([place]) 03/30/2013  . RBBB 03/29/2013  . Hyperkalemia 03/28/2013  . Leukocytosis 03/28/2013  . Nausea vomiting and diarrhea 03/28/2013  . BBB (bundle branch block) 03/28/2013  . Hypertension   . ESRD on dialysis ([place])   . Lack of coordination 04/15/2012  . Muscle weakness (generalized) 04/15/2012  . Arthritis, gouty 04/15/2012  . Difficulty walking 08/18/2011  . Weakness of both legs 08/18/2011   Ihor Austin, LPTA; CBIS [phone]  Aldona Lento 07/14/2017, 3:15 PM  [place], [place], [address] Phone: [phone]   Fax:  [phone]  Name: Jack Irwin MRN: [id number] Date of Birth: July 23, 1965

## 2017-07-15 DIAGNOSIS — N2581 Secondary hyperparathyroidism of renal origin: Secondary | ICD-10-CM | POA: Diagnosis not present

## 2017-07-15 DIAGNOSIS — Z992 Dependence on renal dialysis: Secondary | ICD-10-CM | POA: Diagnosis not present

## 2017-07-15 DIAGNOSIS — N186 End stage renal disease: Secondary | ICD-10-CM | POA: Diagnosis not present

## 2017-07-15 DIAGNOSIS — D509 Iron deficiency anemia, unspecified: Secondary | ICD-10-CM | POA: Diagnosis not present

## 2017-07-17 DIAGNOSIS — Z992 Dependence on renal dialysis: Secondary | ICD-10-CM | POA: Diagnosis not present

## 2017-07-17 DIAGNOSIS — N2581 Secondary hyperparathyroidism of renal origin: Secondary | ICD-10-CM | POA: Diagnosis not present

## 2017-07-17 DIAGNOSIS — D509 Iron deficiency anemia, unspecified: Secondary | ICD-10-CM | POA: Diagnosis not present

## 2017-07-17 DIAGNOSIS — N186 End stage renal disease: Secondary | ICD-10-CM | POA: Diagnosis not present

## 2017-07-20 ENCOUNTER — Other Ambulatory Visit: Payer: Self-pay

## 2017-07-20 DIAGNOSIS — N186 End stage renal disease: Secondary | ICD-10-CM | POA: Diagnosis not present

## 2017-07-20 DIAGNOSIS — N2581 Secondary hyperparathyroidism of renal origin: Secondary | ICD-10-CM | POA: Diagnosis not present

## 2017-07-20 DIAGNOSIS — D509 Iron deficiency anemia, unspecified: Secondary | ICD-10-CM | POA: Diagnosis not present

## 2017-07-20 DIAGNOSIS — M109 Gout, unspecified: Secondary | ICD-10-CM

## 2017-07-20 DIAGNOSIS — Z992 Dependence on renal dialysis: Secondary | ICD-10-CM | POA: Diagnosis not present

## 2017-07-20 MED ORDER — COLCHICINE 0.6 MG PO TABS: 0.3000 mg | ORAL_TABLET | Freq: Every day | ORAL | Status: DC | PRN

## 2017-07-20 MED ORDER — ULORIC 40 MG PO TABS: 40.0000 mg | ORAL_TABLET | Freq: Every day | ORAL | 5 refills | Status: DC

## 2017-07-22 DIAGNOSIS — N2581 Secondary hyperparathyroidism of renal origin: Secondary | ICD-10-CM | POA: Diagnosis not present

## 2017-07-22 DIAGNOSIS — Z992 Dependence on renal dialysis: Secondary | ICD-10-CM | POA: Diagnosis not present

## 2017-07-22 DIAGNOSIS — N186 End stage renal disease: Secondary | ICD-10-CM | POA: Diagnosis not present

## 2017-07-22 DIAGNOSIS — D509 Iron deficiency anemia, unspecified: Secondary | ICD-10-CM | POA: Diagnosis not present

## 2017-07-23 ENCOUNTER — Ambulatory Visit (HOSPITAL_COMMUNITY): Payer: Medicare Other | Admitting: Physical Therapy

## 2017-07-25 DIAGNOSIS — N2581 Secondary hyperparathyroidism of renal origin: Secondary | ICD-10-CM | POA: Diagnosis not present

## 2017-07-25 DIAGNOSIS — D509 Iron deficiency anemia, unspecified: Secondary | ICD-10-CM | POA: Diagnosis not present

## 2017-07-25 DIAGNOSIS — N186 End stage renal disease: Secondary | ICD-10-CM | POA: Diagnosis not present

## 2017-07-25 DIAGNOSIS — Z992 Dependence on renal dialysis: Secondary | ICD-10-CM | POA: Diagnosis not present

## 2017-07-28 ENCOUNTER — Telehealth: Payer: Self-pay | Admitting: Family Medicine

## 2017-07-28 ENCOUNTER — Other Ambulatory Visit: Payer: Self-pay

## 2017-07-28 MED ORDER — COLCHICINE 0.6 MG PO TABS: 0.3000 mg | ORAL_TABLET | Freq: Every day | ORAL | 3 refills | Status: DC | PRN

## 2017-07-28 NOTE — Telephone Encounter (Signed)
Pt called and requests information about authorization for medications and states that one medication has not been prescribed at the pharmacy; requests a call back to clarify medication refills

## 2017-07-29 ENCOUNTER — Ambulatory Visit: Payer: Self-pay | Admitting: Urology

## 2017-07-29 DIAGNOSIS — Z992 Dependence on renal dialysis: Secondary | ICD-10-CM | POA: Diagnosis not present

## 2017-07-29 DIAGNOSIS — D509 Iron deficiency anemia, unspecified: Secondary | ICD-10-CM | POA: Diagnosis not present

## 2017-07-29 DIAGNOSIS — N2581 Secondary hyperparathyroidism of renal origin: Secondary | ICD-10-CM | POA: Diagnosis not present

## 2017-07-29 DIAGNOSIS — N186 End stage renal disease: Secondary | ICD-10-CM | POA: Diagnosis not present

## 2017-07-29 NOTE — Telephone Encounter (Signed)
This was sent yesterday and uloric prior authorization has been submitted. Thanks!

## 2017-07-30 IMAGING — US US EXTREM LOW VENOUS*R*
1 series · 13 of 24 positions shown · non-contrast
Comparison: 01/24/2016

CLINICAL DATA: Right leg pain, 3 days duration.



[Series 1: us extrem low venous*right* · 0.08mm/px · 13 of 35 slices shown]
[im 1/35]
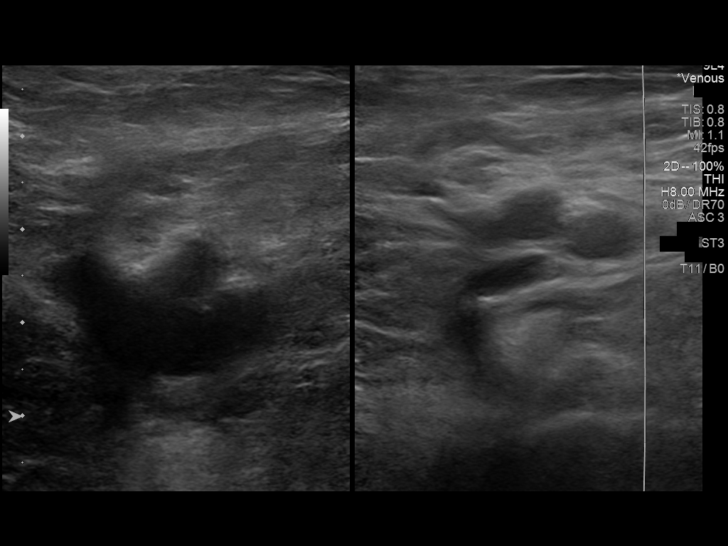
[im 3/35]
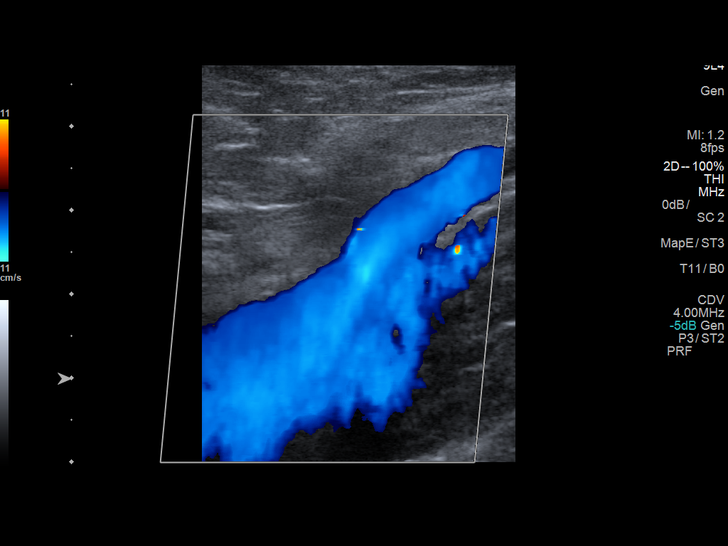
[im 6/35]
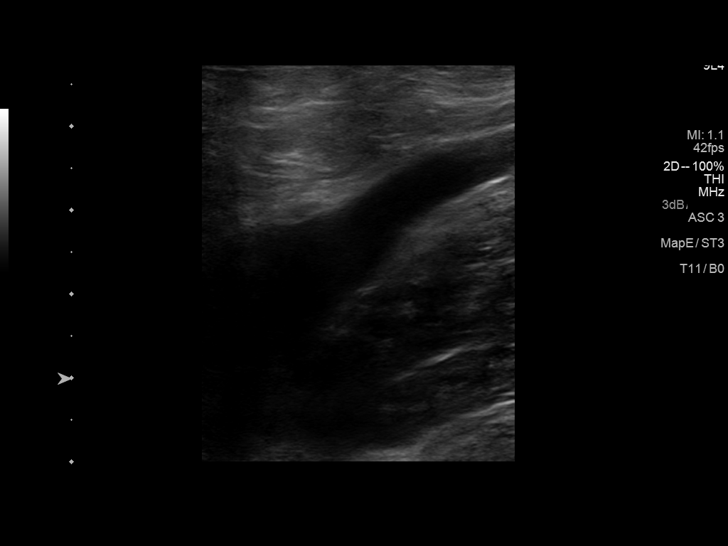
[im 9/35]
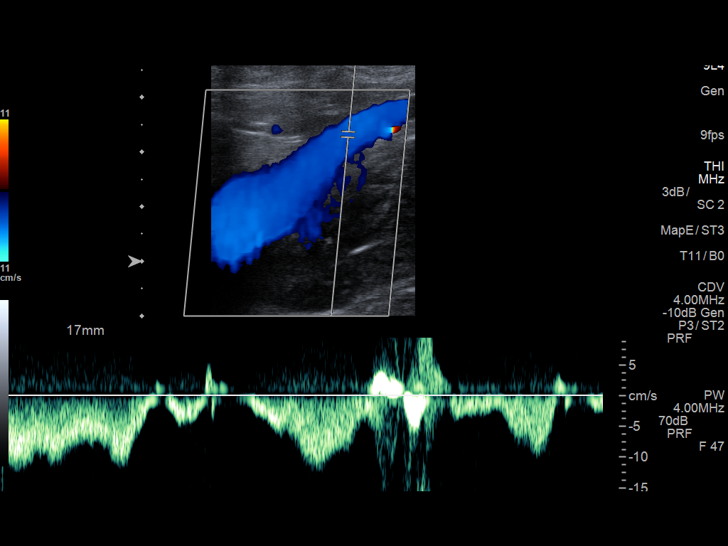
[im 12/35]
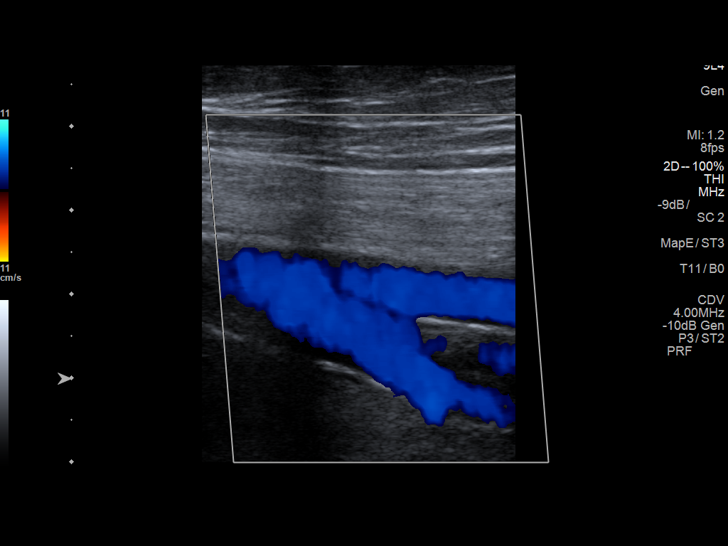
[im 15/35]
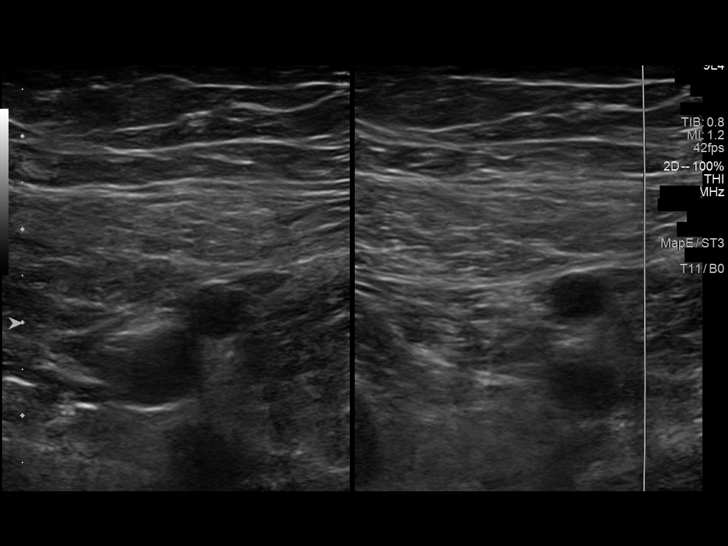
[im 18/35]
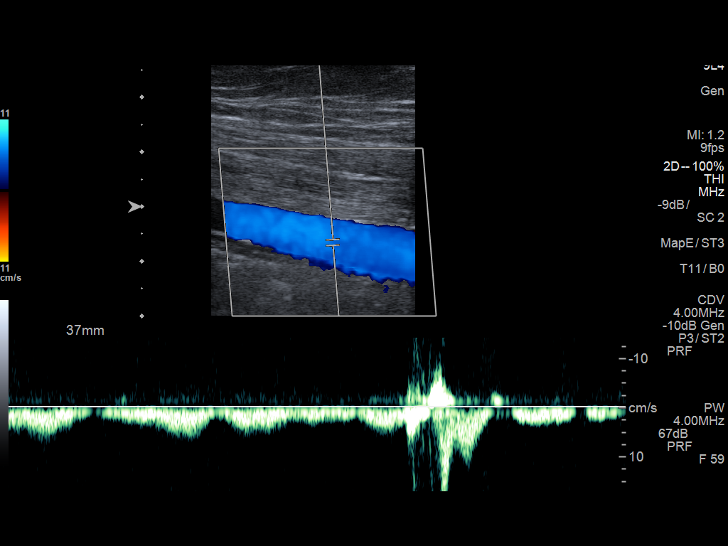
[im 20/35]
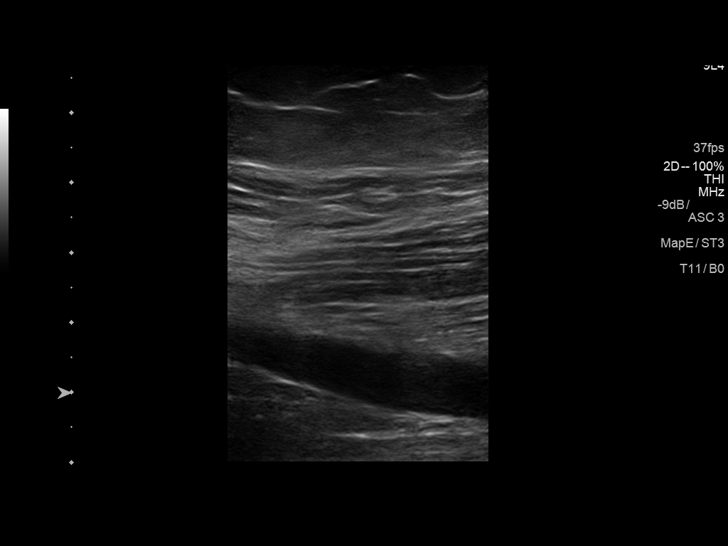
[im 23/35]
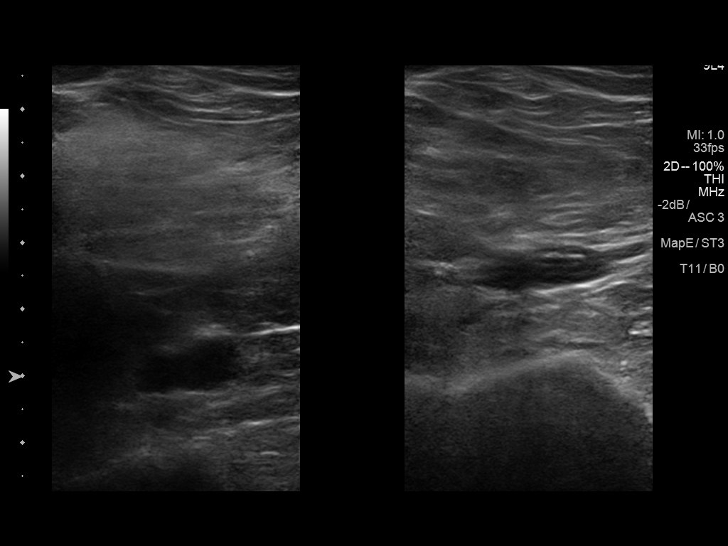
[im 26/35]
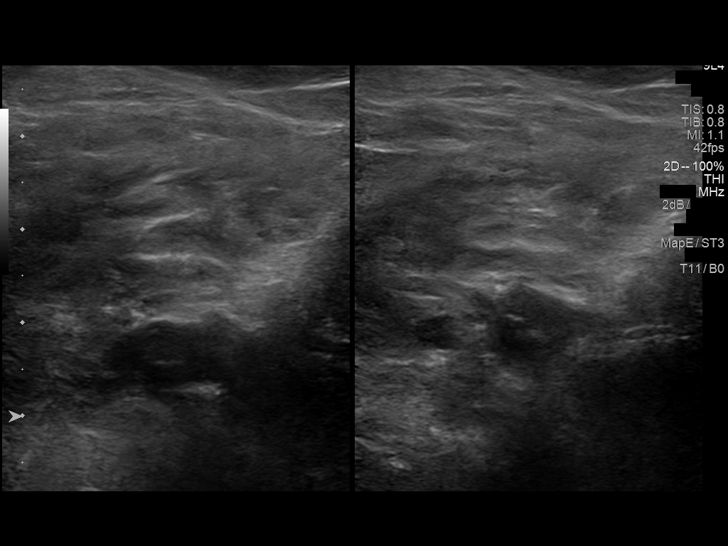
[im 29/35]
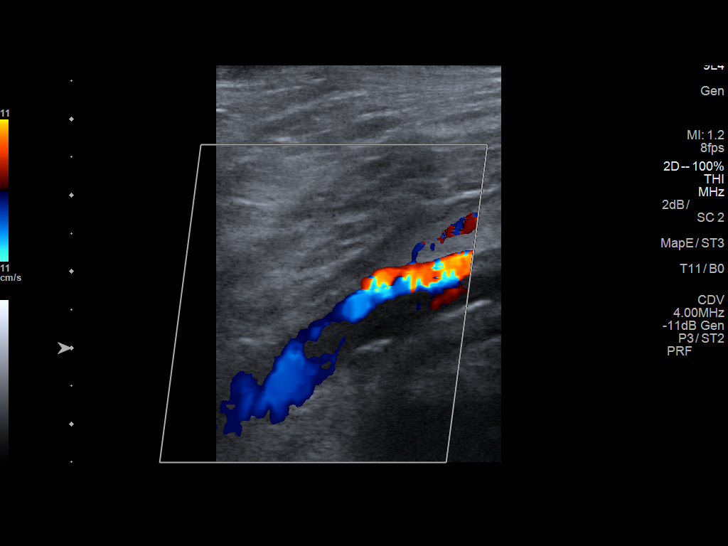
[im 32/35]
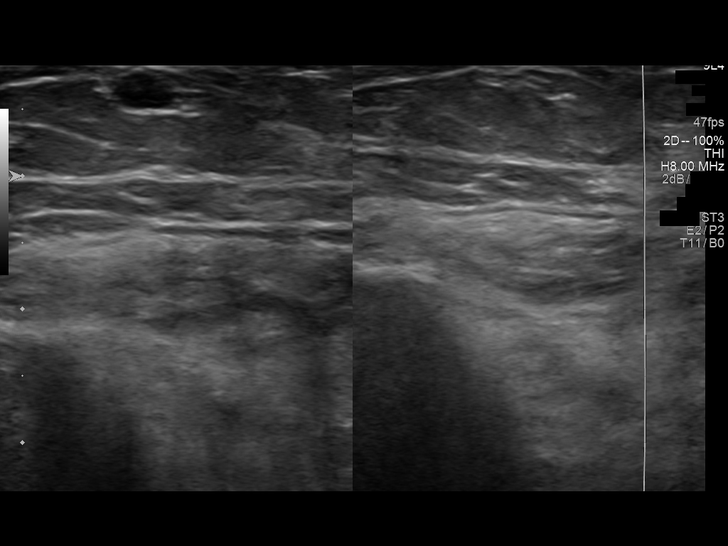
[im 35/35]
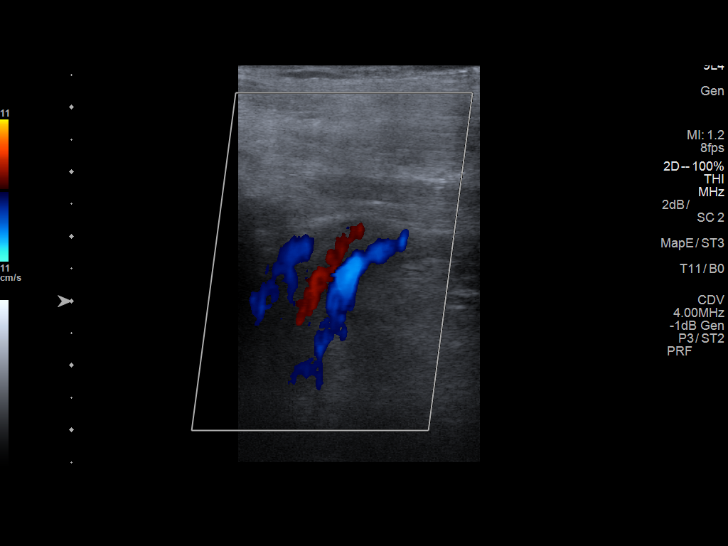

[13 of 24 positions shown; findings below may reference images not displayed]

FINDINGS: Contralateral Common Femoral Vein: Respiratory phasicity is normal
and symmetric with the symptomatic side. No evidence of thrombus.
Normal compressibility.

Common Femoral Vein: No evidence of thrombus. Normal
compressibility, respiratory phasicity and response to augmentation.

Saphenofemoral Junction: No evidence of thrombus. Normal
compressibility and flow on color Doppler imaging.

Profunda Femoral Vein: No evidence of thrombus. Normal
compressibility and flow on color Doppler imaging.

Femoral Vein: No evidence of thrombus. Normal compressibility,
respiratory phasicity and response to augmentation.

Popliteal Vein: Deep venous thrombosis with nonocclusive thrombus.
Whereas there has been previous thrombosis of this vessel and some
of the findings may be chronic, there does appear to be some
intraluminal thrombus that represents at least some acute component.

Calf Veins: No evidence of thrombus. Normal compressibility and flow
on color Doppler imaging.

Superficial Great Saphenous Vein: No evidence of thrombus. Normal
compressibility and flow on color Doppler imaging.

Venous Reflux:  None.

Other Findings:  None.
IMPRESSION: History of previous DVT of the popliteal vein. Today's study
demonstrates what probably represents a combination of chronic
changes as well as some acute nonocclusive thrombus in the popliteal
vein. The rest of the exam is negative.

## 2017-07-31 ENCOUNTER — Ambulatory Visit (HOSPITAL_COMMUNITY): Payer: Medicare Other

## 2017-07-31 ENCOUNTER — Telehealth (HOSPITAL_COMMUNITY): Payer: Self-pay | Admitting: Family Medicine

## 2017-07-31 DIAGNOSIS — Z992 Dependence on renal dialysis: Secondary | ICD-10-CM | POA: Diagnosis not present

## 2017-07-31 DIAGNOSIS — N186 End stage renal disease: Secondary | ICD-10-CM | POA: Diagnosis not present

## 2017-07-31 DIAGNOSIS — N2581 Secondary hyperparathyroidism of renal origin: Secondary | ICD-10-CM | POA: Diagnosis not present

## 2017-07-31 DIAGNOSIS — D509 Iron deficiency anemia, unspecified: Secondary | ICD-10-CM | POA: Diagnosis not present

## 2017-07-31 NOTE — Telephone Encounter (Signed)
07/31/17  pt called to cx and rescheduled for 7/1

## 2017-08-01 ENCOUNTER — Other Ambulatory Visit: Payer: Self-pay

## 2017-08-01 ENCOUNTER — Encounter (HOSPITAL_COMMUNITY): Payer: Self-pay | Admitting: Emergency Medicine

## 2017-08-01 ENCOUNTER — Emergency Department (HOSPITAL_COMMUNITY)
Admission: EM | Admit: 2017-08-01 | Discharge: 2017-08-01 | Disposition: A | Discharge status: Home / Self Care | Payer: Medicare Other | Attending: Emergency Medicine | Admitting: Emergency Medicine

## 2017-08-01 DIAGNOSIS — N492 Inflammatory disorders of scrotum: Secondary | ICD-10-CM | POA: Insufficient documentation

## 2017-08-01 DIAGNOSIS — Z992 Dependence on renal dialysis: Secondary | ICD-10-CM | POA: Insufficient documentation

## 2017-08-01 DIAGNOSIS — E1140 Type 2 diabetes mellitus with diabetic neuropathy, unspecified: Secondary | ICD-10-CM | POA: Diagnosis not present

## 2017-08-01 DIAGNOSIS — I120 Hypertensive chronic kidney disease with stage 5 chronic kidney disease or end stage renal disease: Secondary | ICD-10-CM | POA: Diagnosis not present

## 2017-08-01 DIAGNOSIS — E1122 Type 2 diabetes mellitus with diabetic chronic kidney disease: Secondary | ICD-10-CM | POA: Insufficient documentation

## 2017-08-01 DIAGNOSIS — R197 Diarrhea, unspecified: Secondary | ICD-10-CM | POA: Diagnosis not present

## 2017-08-01 DIAGNOSIS — N186 End stage renal disease: Secondary | ICD-10-CM | POA: Diagnosis not present

## 2017-08-01 DIAGNOSIS — Z79899 Other long term (current) drug therapy: Secondary | ICD-10-CM | POA: Diagnosis not present

## 2017-08-01 DIAGNOSIS — Z7901 Long term (current) use of anticoagulants: Secondary | ICD-10-CM | POA: Diagnosis not present

## 2017-08-01 LAB — COMPREHENSIVE METABOLIC PANEL
ALBUMIN: 3.9 g/dL (ref 3.5–5.0)
ALT: 44 U/L (ref 0–44)
ANION GAP: 13 (ref 5–15)
AST: 54 U/L — AB (ref 15–41)
Alkaline Phosphatase: 77 U/L (ref 38–126)
BUN: 37 mg/dL — AB (ref 6–20)
CHLORIDE: 99 mmol/L (ref 98–111)
CO2: 27 mmol/L (ref 22–32)
Calcium: 9.8 mg/dL (ref 8.9–10.3)
Creatinine, Ser: 13.25 mg/dL — ABNORMAL HIGH (ref 0.61–1.24)
GFR calc Af Amer: 4 mL/min — ABNORMAL LOW (ref >60)
GFR calc non Af Amer: 4 mL/min — ABNORMAL LOW (ref >60)
GLUCOSE: 100 mg/dL — AB (ref 70–99)
POTASSIUM: 3.8 mmol/L (ref 3.5–5.1)
Sodium: 139 mmol/L (ref 135–145)
Total Bilirubin: 1.2 mg/dL (ref 0.3–1.2)
Total Protein: 8 g/dL (ref 6.5–8.1)

## 2017-08-01 LAB — CBC
HEMATOCRIT: 47.5 % (ref 39.0–52.0)
HEMOGLOBIN: 15.5 g/dL (ref 13.0–17.0)
MCH: 32.3 pg (ref 26.0–34.0)
MCHC: 32.6 g/dL (ref 30.0–36.0)
MCV: 99 fL (ref 78.0–100.0)
Platelets: 108 10*3/uL — ABNORMAL LOW (ref 150–400)
RBC: 4.8 MIL/uL (ref 4.22–5.81)
RDW: 16.5 % — ABNORMAL HIGH (ref 11.5–15.5)
WBC: 11.1 10*3/uL — ABNORMAL HIGH (ref 4.0–10.5)

## 2017-08-01 LAB — LIPASE, BLOOD: Lipase: 50 U/L (ref 11–51)

## 2017-08-01 MED ORDER — SULFAMETHOXAZOLE-TRIMETHOPRIM 800-160 MG PO TABS: 1.0000 | ORAL_TABLET | Freq: Every day | ORAL | 0 refills | Status: AC

## 2017-08-01 MED ORDER — SULFAMETHOXAZOLE-TRIMETHOPRIM 800-160 MG PO TABS
1.0000 | ORAL_TABLET | Freq: Once | ORAL | Status: AC
  Administered 2017-08-01: 1 via ORAL
  Filled 2017-08-01: qty 1

## 2017-08-01 NOTE — ED Notes (Signed)
Pt given diet Danai Gotto-Ale to drink

## 2017-08-01 NOTE — ED Triage Notes (Signed)
Pt reports D/ started yesterday, had his full dialysis tx yesterday. Dialysis RN did not know pt had D/ prior to dialysis. D/ X4 today. Also c/o abscess, not draining, on his R groin. Previous abscess in same area.

## 2017-08-01 NOTE — ED Notes (Signed)
Attempted to give urine sample. Could not.

## 2017-08-01 NOTE — ED Provider Notes (Signed)
[facility] EMERGENCY DEPARTMENT Provider Note   CSN: [id number] Arrival date & time: 08/01/17  1910     History   Chief Complaint Chief Complaint  Patient presents with  . Diarrhea  . Abscess    HPI Jack Irwin is a 52 y.o. male.  HPI Patient presents with diarrhea.  Has had since yesterday.  Somewhat watery.  No recent antibiotics.  He is a dialysis patient was dialyzed yesterday.  No nausea or vomiting.  Slight abdominal pain.  No blood in the diarrhea.  No recent sick contacts. Patient also has a swelling on his scrotum.  States he has had an abscess there previously.  No drainage.  No fevers.  States he had previously been on antibiotics. Past Medical History:  Diagnosis Date  . Anemia   . Arthritis   . BPH (benign prostatic hyperplasia)   . Chronic back pain   . Difficulty walking   . DVT (deep venous thrombosis) ([place])    Right popliteal DVT December 2017  . End-stage renal disease on hemodialysis ([place])    Dr. Lowanda Foster  . Erectile dysfunction   . Essential hypertension   . Gout   . Neuropathy, diabetic ([place])   . Type 2 diabetes mellitus ([place])   . Venous (peripheral) insufficiency     Patient Active Problem List   Diagnosis Date Noted  . Onychomycosis 06/17/2016  . Constipation 03/05/2016  . Anemia in chronic kidney disease (CKD) 03/05/2016  . Bleeding from wound 03/03/2016  . Chronic anticoagulation 03/03/2016  . Scrotal abscess 02/25/2016  . Type 2 diabetes mellitus ([place]) 02/25/2016  . Acute venous embolism and thrombosis of deep vessels of proximal lower extremity ([place]) [I82.4Y9] 02/11/2016  . Encounter for therapeutic drug monitoring 02/11/2016  . Nausea & vomiting 11/17/2014  . Thrombocytopenia ([place]) 02/21/2014  . Morbid obesity ([place]) 03/30/2013  . RBBB 03/29/2013  . Hyperkalemia 03/28/2013  . Leukocytosis 03/28/2013  . Nausea vomiting and diarrhea 03/28/2013  . BBB (bundle branch block) 03/28/2013  . Hypertension   . ESRD on dialysis  ([place])   . Lack of coordination 04/15/2012  . Muscle weakness (generalized) 04/15/2012  . Arthritis, gouty 04/15/2012  . Difficulty walking 08/18/2011  . Weakness of both legs 08/18/2011    Past Surgical History:  Procedure Laterality Date  . AV FISTULA PLACEMENT Left 03/09/2014   Procedure: INSERTION OF ARTERIOVENOUS (AV) GORE-TEX GRAFT ARM;  Surgeon: Angelia Mould, MD;  Location: [facility] OR;  Service: Vascular;  Laterality: Left;  . AV FISTULA PLACEMENT Right 05/06/2016   Procedure: INSERTION OF ARTERIOVENOUS (AV) GORE-TEX GRAFT  Right ARM;  Surgeon: Elam Dutch, MD;  Location: [place];  Service: Vascular;  Laterality: Right;  . CYST EXCISION Right    cyst removed on thumb 2001  . INSERTION OF DIALYSIS CATHETER    . INSERTION OF DIALYSIS CATHETER Left 03/09/2014   Procedure: INSERTION OF DIALYSIS CATHETER;  Surgeon: Angelia Mould, MD;  Location: [place];  Service: Vascular;  Laterality: Left;  . IR THROMBECTOMY AV FISTULA W/THROMBOLYSIS/PTA INC/SHUNT/IMG RIGHT Right 11/01/2016  . IR US GUIDE VASC ACCESS RIGHT  11/01/2016  . IRRIGATION AND DEBRIDEMENT ABSCESS N/A 02/25/2016   Procedure: IRRIGATION AND DEBRIDEMENT SCROTAL ABSCESS;  Surgeon: Nickie Retort, MD;  Location: [facility] ORS;  Service: Urology;  Laterality: N/A;        Home Medications    Prior to Admission medications   Medication Sig Start Date End Date Taking? Authorizing Provider  apixaban (ELIQUIS) 2.5 MG TABS  tablet Take 1 tablet (2.5 mg total) 2 (two) times daily by mouth. 12/16/16  Yes Dorena Dew, FNP  colchicine 0.6 MG tablet Take 0.5 tablets (0.3 mg total) by mouth daily as needed (for gout). 07/28/17  Yes Dorena Dew, FNP  gabapentin (NEURONTIN) 100 MG capsule Take 1 capsule (100 mg total) by mouth daily. 10/13/16  Yes Dorena Dew, FNP  Menthol, Topical Analgesic, (BIOFREEZE) 4 % GEL Apply 1 each topically 3 (three) times daily as needed. 03/30/17  Yes Dorena Dew, FNP  Multiple Vitamin  (DAILY VITE PO) Take 1 tablet by mouth daily.   Yes [provider]  sevelamer carbonate (RENVELA) 800 MG tablet Take 1 tablet (800 mg total) by mouth 2 (two) times daily between meals as needed (With snacks). 05/28/17  Yes Dorena Dew, FNP  sildenafil (REVATIO) 20 MG tablet Take 1 tablet (20 mg total) by mouth daily as needed. Dissolved under tongue (TROCHE) 60 minutes prior to sexual encounter 11/06/16  Yes Dorena Dew, FNP  traMADol (ULTRAM) 50 MG tablet Take 1 tablet (50 mg total) by mouth every 6 (six) hours as needed. 02/03/17  Yes Triplett, Tammy, PA-C  betamethasone dipropionate (DIPROLENE) 0.05 % cream Apply topically 2 (two) times daily. 06/17/16   Dorena Dew, FNP  diclofenac sodium (VOLTAREN) 1 % GEL Apply 4 g topically 4 (four) times daily. 03/12/17   Dorena Dew, FNP  nystatin (NYSTATIN) powder Apply topically 2 (two) times daily. 08/28/16   Dorena Dew, FNP  ondansetron (ZOFRAN) 4 MG tablet Take 1 tablet (4 mg total) by mouth every 8 (eight) hours as needed for nausea or vomiting. 06/05/17   Dorena Dew, FNP  sulfamethoxazole-trimethoprim (BACTRIM DS,SEPTRA DS) 800-160 MG tablet Take 1 tablet by mouth daily for 7 days. 08/01/17 08/08/17  Davonna Belling, MD  tiZANidine (ZANAFLEX) 4 MG tablet Take 1 tablet (4 mg total) by mouth every 8 (eight) hours as needed for muscle spasms. 06/01/17 06/01/18  Dorena Dew, FNP  triamcinolone cream (KENALOG) 0.1 % Apply 1 application topically 2 (two) times daily as needed.     [provider]  ULORIC 40 MG tablet Take 1 tablet (40 mg total) by mouth daily. 07/20/17   Dorena Dew, FNP    Family History Family History  Problem Relation Age of Onset  . Diabetes Mother   . Hypertension Mother   . Heart failure Mother   . Hyperlipidemia Mother   . Heart attack Mother   . Cancer Father   . Diabetes Father   . Hypertension Father   . Hyperlipidemia Father     Social History Social History    Tobacco Use  . Smoking status: Never Smoker  . Smokeless tobacco: Never Used  Substance Use Topics  . Alcohol use: No    Alcohol/week: 0.0 oz  . Drug use: No     Allergies   Doxycycline   Review of Systems Review of Systems  Constitutional: Negative for appetite change.  HENT: Negative for congestion.   Respiratory: Negative for shortness of breath.   Cardiovascular: Negative for chest pain.  Gastrointestinal: Positive for diarrhea.  Genitourinary: Negative for discharge and penile pain.  Musculoskeletal: Negative for back pain.  Skin: Negative for pallor.  Neurological: Negative for seizures.  Hematological: Negative for adenopathy.  Psychiatric/Behavioral: Negative for confusion.     Physical Exam Updated Vital Signs BP 100/67   Pulse (!) 103   Temp 98.8 F (37.1 C) (Oral)  Resp 17   Ht 5\' 11"  (1.803 m)   Wt 118.8 kg (262 lb)   SpO2 96%   BMI 36.54 kg/m   Physical Exam  Constitutional: He appears well-developed.  Eyes: Pupils are equal, round, and reactive to light.  Neck: Neck supple.  Cardiovascular: Normal rate.  Pulmonary/Chest: He exhibits no tenderness.  Abdominal: There is no tenderness.  Genitourinary:  Genitourinary Comments: Approximately 2 cm tender swollen mass to anterior scrotum.  With 18-gauge needle a stab wound was made and purulent and bloody drainage came out.  Still some firmness after.  Musculoskeletal: He exhibits no edema.  Dialysis graft right upper arm.  Neurological: He is alert.  Skin: Skin is warm.     ED Treatments / Results  Labs (all labs ordered are listed, but only abnormal results are displayed) Labs Reviewed  COMPREHENSIVE METABOLIC PANEL - Abnormal; Notable for the following components:      Result Value   Glucose, Bld 100 (*)    BUN 37 (*)    Creatinine, Ser 13.25 (*)    AST 54 (*)    GFR calc non Af Amer 4 (*)    GFR calc Af Amer 4 (*)    All other components within normal limits  CBC - Abnormal;  Notable for the following components:   WBC 11.1 (*)    RDW 16.5 (*)    Platelets 108 (*)    All other components within normal limits  LIPASE, BLOOD  URINALYSIS, ROUTINE W REFLEX MICROSCOPIC    EKG None  Radiology No results found.  Procedures Procedures (including critical care time)  Medications Ordered in ED Medications  sulfamethoxazole-trimethoprim (BACTRIM DS,SEPTRA DS) 800-160 MG per tablet 1 tablet (1 tablet Oral Given 08/01/17 2229)     Initial Impression / Assessment and Plan / ED Course  I have reviewed the triage vital signs and the nursing notes.  Pertinent labs & imaging results that were available during my care of the patient were reviewed by me and considered in my medical decision making (see chart for details).     Patient with diarrhea.  No recent antibiotics although he does go to dialysis.  Lab work reassuring.  Has been tolerating orals. Also small scrotal abscess.  Will give antibiotics.  Urology follow-up if symptoms do not improve.  Bactrim for MRSA coverage after discussion with pharmacy.  Final Clinical Impressions(s) / ED Diagnoses   Final diagnoses:  Diarrhea, unspecified type  Scrotal abscess    ED Discharge Orders        Ordered    sulfamethoxazole-trimethoprim (BACTRIM DS,SEPTRA DS) 800-160 MG tablet  Daily     08/01/17 2237       Davonna Belling, MD 08/01/17 2238

## 2017-08-02 DIAGNOSIS — Z992 Dependence on renal dialysis: Secondary | ICD-10-CM | POA: Diagnosis not present

## 2017-08-02 DIAGNOSIS — N186 End stage renal disease: Secondary | ICD-10-CM | POA: Diagnosis not present

## 2017-08-03 ENCOUNTER — Ambulatory Visit (HOSPITAL_COMMUNITY): Payer: Medicare Other | Admitting: Physical Therapy

## 2017-08-03 ENCOUNTER — Telehealth (HOSPITAL_COMMUNITY): Payer: Self-pay | Admitting: Physical Therapy

## 2017-08-03 DIAGNOSIS — Z992 Dependence on renal dialysis: Secondary | ICD-10-CM | POA: Diagnosis not present

## 2017-08-03 DIAGNOSIS — N186 End stage renal disease: Secondary | ICD-10-CM | POA: Diagnosis not present

## 2017-08-03 DIAGNOSIS — N2581 Secondary hyperparathyroidism of renal origin: Secondary | ICD-10-CM | POA: Diagnosis not present

## 2017-08-03 DIAGNOSIS — D509 Iron deficiency anemia, unspecified: Secondary | ICD-10-CM | POA: Diagnosis not present

## 2017-08-04 ENCOUNTER — Encounter (HOSPITAL_COMMUNITY): Payer: Self-pay | Admitting: Physical Therapy

## 2017-08-04 IMAGING — US US SCROTUM
1 series · 13 of 25 positions shown · non-contrast
Comparison: None.

CLINICAL DATA: Right scrotal region soft tissue swelling

EXAM:
ULTRASOUND OF SCROTUM
TECHNIQUE: Complete ultrasound examination of the testicles, epididymis, and
other scrotal structures was performed.

[Series 1: us scrotum · 0.07mm/px · 68 acquisitions, 13 frames shown]
[im 1/68]
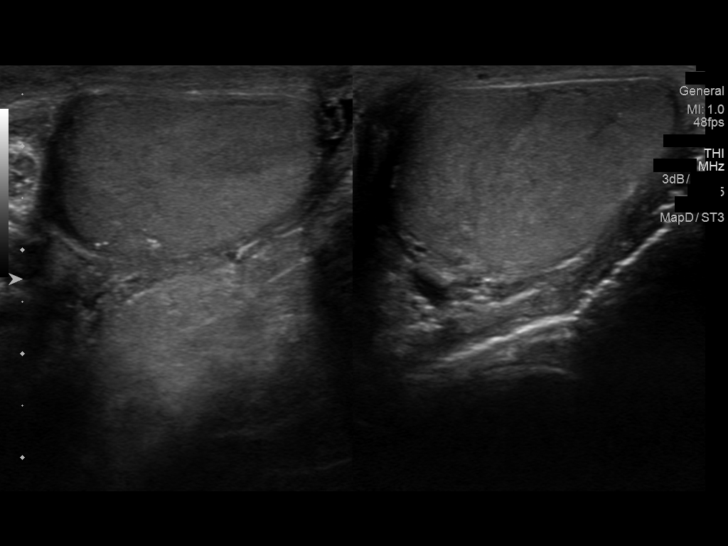
[im 6/68]
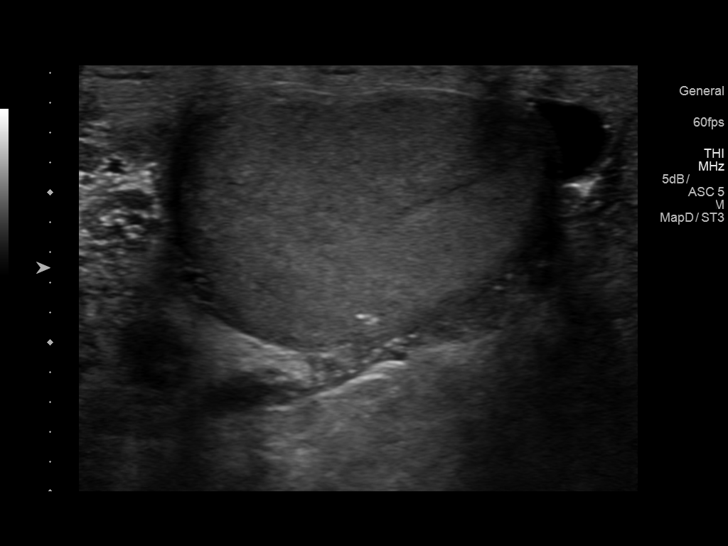
[im 12/68]
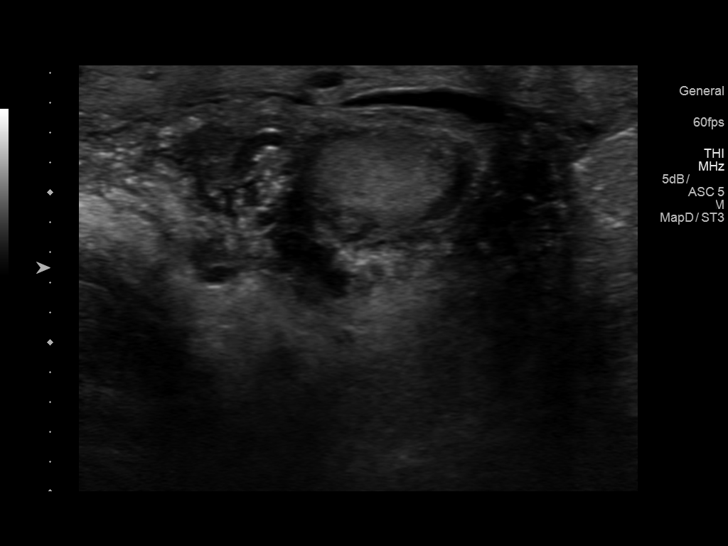
[im 17/68]
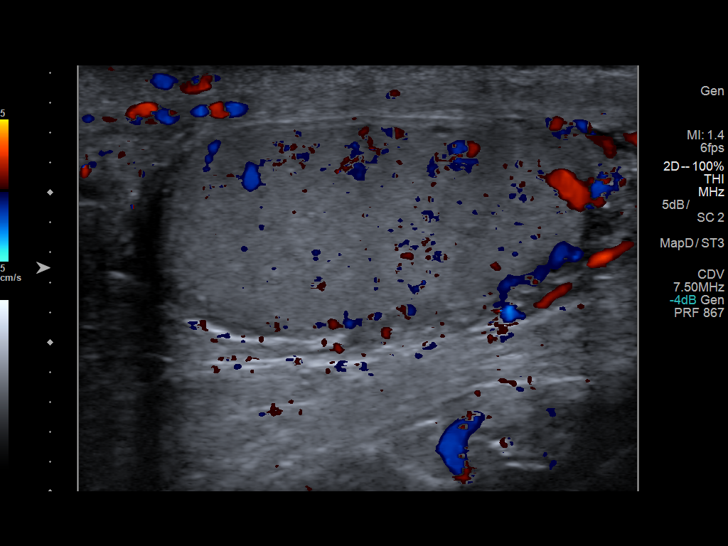
[im 23/68]
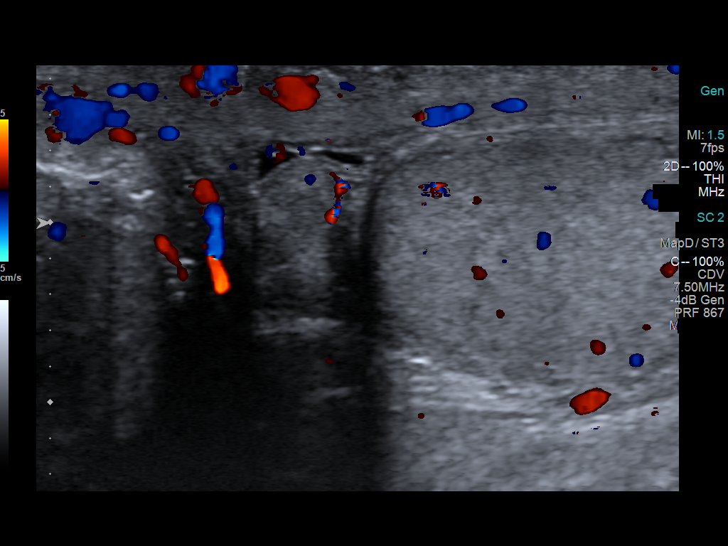
[im 28/68]
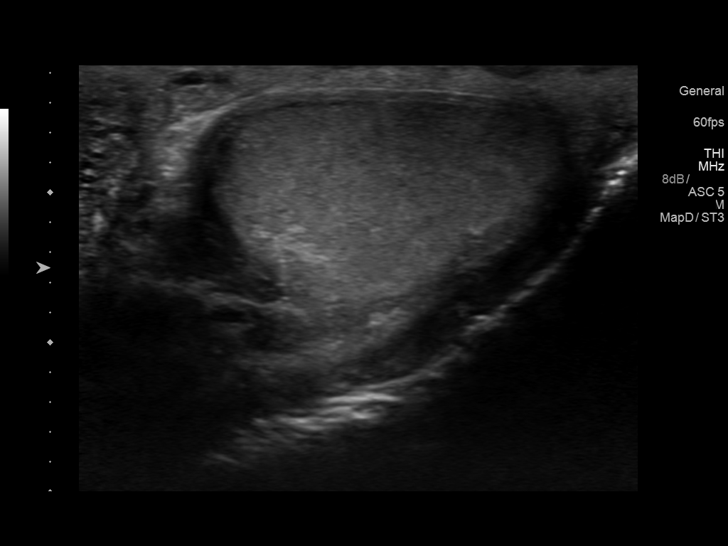
[im 34/68]
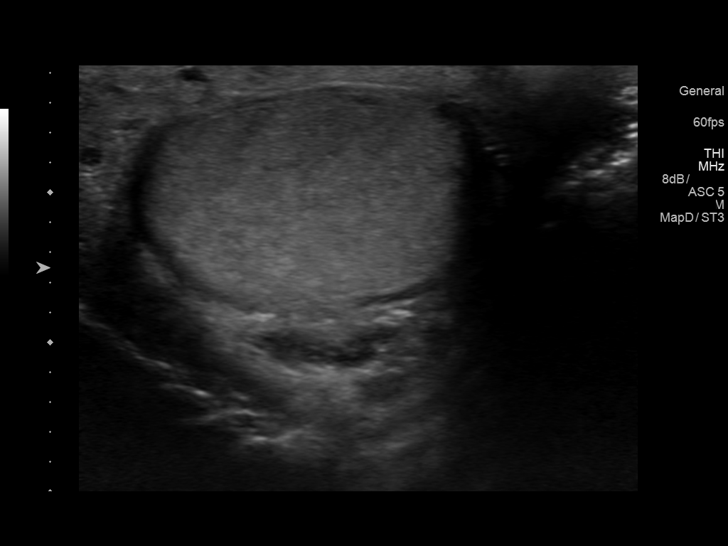
[im 40/68]
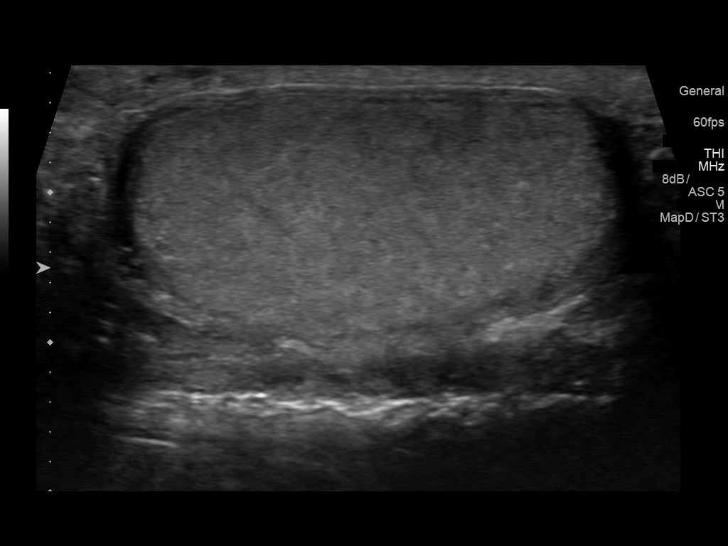
[im 45/68]
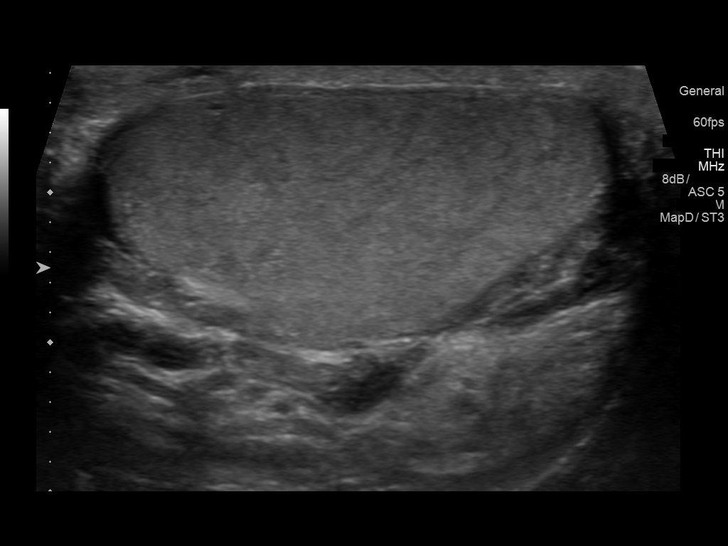
[im 51/68]
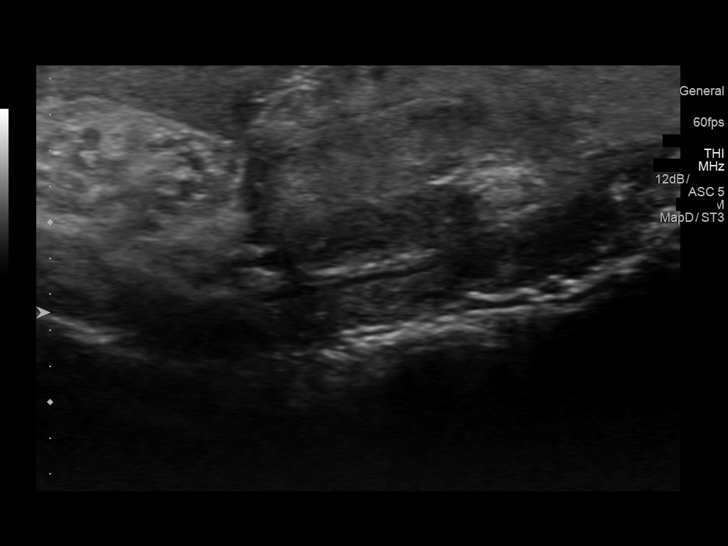
[im 56/68]
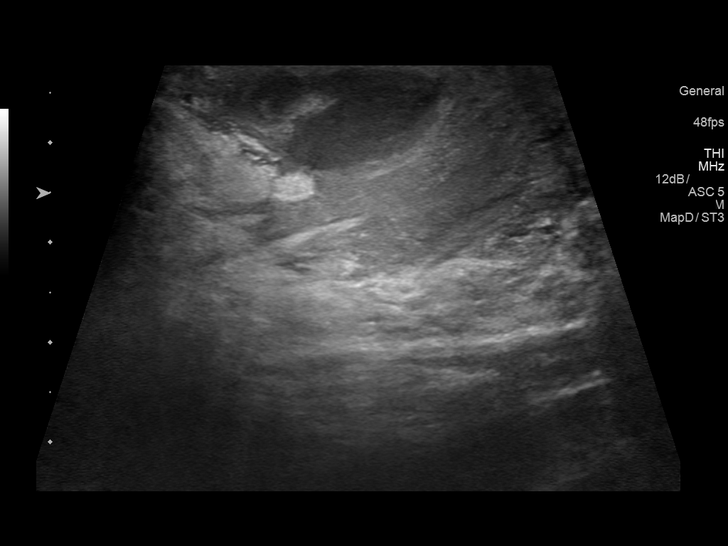
[im 62/68]
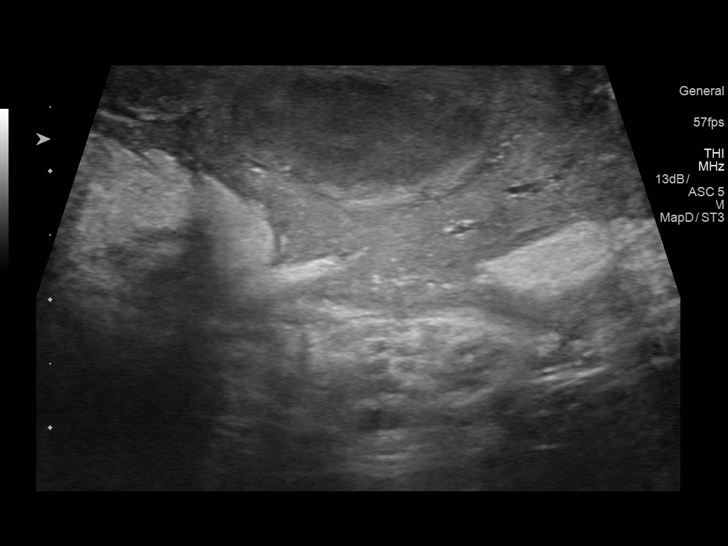
[im 68/68]
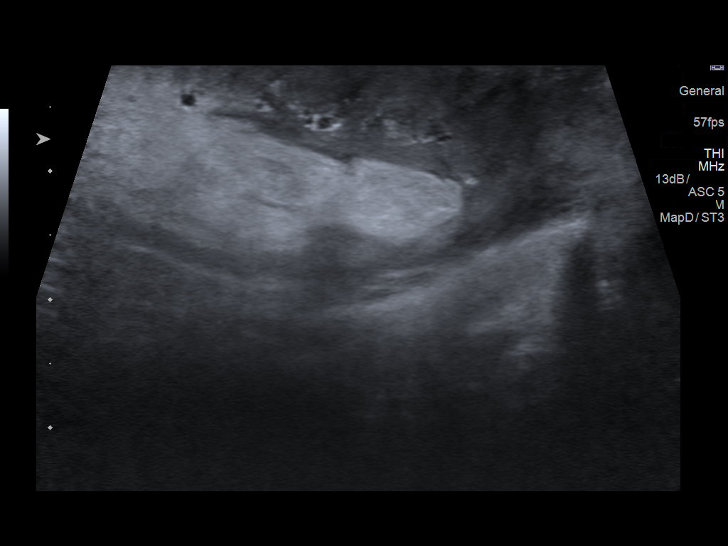

[13 of 25 positions shown; findings below may reference images not displayed]

FINDINGS: Right testicle

Measurements: 3.5 x 1.3 x 2.3 cm. No mass or microlithiasis
visualized. Color flow is seen in the right testis.

Left testicle

Measurements: 3.5 x 1.6 x 3.0 cm. No mass or microlithiasis
visualized. Color flow is seen in the left testis.

Right epididymis: Right epididymis appears mildly edematous. No
well-defined mass or hyperemia.

Left epididymis:  Normal in size and appearance.

Hydrocele: Small right hydrocele. There loculations within this
hydrocele.

Varicocele:  None visualized.

Within the right scrotum, there is wall thickening. There is an
apparent somewhat irregular area of decreased attenuation in the
right scrotum laterally measuring 2.7 x 1.0 x 1.8 cm. This area
appears to represent loculated fluid. There is mild thickening along
the periphery of this area. No air is seen in this structure.
IMPRESSION: 1. Apparent loculated fluid in the right scrotum with septation and
peripheral thickening, likely thickening of fluid collection wall.
Suspect residua of prior abscess or possibly liquified hematoma.
Infected hematoma cannot be excluded. There is nonspecific soft
tissue prominence in this area. There is no well-defined mass in
this area.

2. Suspect mild epididymitis with mild edematous appearing
epididymis on the right. No epididymal mass or hyperemia.

3. No intratesticular mass on either side. Color flow seen in each
testis. No demonstrable orchitis.

4.  Small mildly loculated right hydrocele.

## 2017-08-07 DIAGNOSIS — N186 End stage renal disease: Secondary | ICD-10-CM | POA: Diagnosis not present

## 2017-08-07 DIAGNOSIS — N2581 Secondary hyperparathyroidism of renal origin: Secondary | ICD-10-CM | POA: Diagnosis not present

## 2017-08-07 DIAGNOSIS — T82868A Thrombosis of vascular prosthetic devices, implants and grafts, initial encounter: Secondary | ICD-10-CM | POA: Diagnosis not present

## 2017-08-07 DIAGNOSIS — I871 Compression of vein: Secondary | ICD-10-CM | POA: Diagnosis not present

## 2017-08-07 DIAGNOSIS — Z992 Dependence on renal dialysis: Secondary | ICD-10-CM | POA: Diagnosis not present

## 2017-08-07 DIAGNOSIS — D509 Iron deficiency anemia, unspecified: Secondary | ICD-10-CM | POA: Diagnosis not present

## 2017-08-10 DIAGNOSIS — Z992 Dependence on renal dialysis: Secondary | ICD-10-CM | POA: Diagnosis not present

## 2017-08-10 DIAGNOSIS — N2581 Secondary hyperparathyroidism of renal origin: Secondary | ICD-10-CM | POA: Diagnosis not present

## 2017-08-10 DIAGNOSIS — D509 Iron deficiency anemia, unspecified: Secondary | ICD-10-CM | POA: Diagnosis not present

## 2017-08-10 DIAGNOSIS — E119 Type 2 diabetes mellitus without complications: Secondary | ICD-10-CM | POA: Diagnosis not present

## 2017-08-10 DIAGNOSIS — N186 End stage renal disease: Secondary | ICD-10-CM | POA: Diagnosis not present

## 2017-08-11 ENCOUNTER — Ambulatory Visit (HOSPITAL_COMMUNITY): Payer: Medicare Other | Admitting: Physical Therapy

## 2017-08-11 ENCOUNTER — Telehealth (HOSPITAL_COMMUNITY): Payer: Self-pay | Admitting: Family Medicine

## 2017-08-11 NOTE — Telephone Encounter (Signed)
08/11/17  said his neck was feeling better... cx this and RS for re-eval for 7/16

## 2017-08-12 DIAGNOSIS — N186 End stage renal disease: Secondary | ICD-10-CM | POA: Diagnosis not present

## 2017-08-12 DIAGNOSIS — Z992 Dependence on renal dialysis: Secondary | ICD-10-CM | POA: Diagnosis not present

## 2017-08-12 DIAGNOSIS — N2581 Secondary hyperparathyroidism of renal origin: Secondary | ICD-10-CM | POA: Diagnosis not present

## 2017-08-12 DIAGNOSIS — D509 Iron deficiency anemia, unspecified: Secondary | ICD-10-CM | POA: Diagnosis not present

## 2017-08-14 DIAGNOSIS — D509 Iron deficiency anemia, unspecified: Secondary | ICD-10-CM | POA: Diagnosis not present

## 2017-08-14 DIAGNOSIS — N2581 Secondary hyperparathyroidism of renal origin: Secondary | ICD-10-CM | POA: Diagnosis not present

## 2017-08-14 DIAGNOSIS — N186 End stage renal disease: Secondary | ICD-10-CM | POA: Diagnosis not present

## 2017-08-14 DIAGNOSIS — Z992 Dependence on renal dialysis: Secondary | ICD-10-CM | POA: Diagnosis not present

## 2017-08-17 DIAGNOSIS — Z992 Dependence on renal dialysis: Secondary | ICD-10-CM | POA: Diagnosis not present

## 2017-08-17 DIAGNOSIS — N2581 Secondary hyperparathyroidism of renal origin: Secondary | ICD-10-CM | POA: Diagnosis not present

## 2017-08-17 DIAGNOSIS — D509 Iron deficiency anemia, unspecified: Secondary | ICD-10-CM | POA: Diagnosis not present

## 2017-08-17 DIAGNOSIS — N186 End stage renal disease: Secondary | ICD-10-CM | POA: Diagnosis not present

## 2017-08-18 ENCOUNTER — Other Ambulatory Visit: Payer: Self-pay | Admitting: Urology

## 2017-08-18 ENCOUNTER — Telehealth (HOSPITAL_COMMUNITY): Payer: Self-pay | Admitting: Physical Therapy

## 2017-08-18 ENCOUNTER — Ambulatory Visit (HOSPITAL_COMMUNITY): Payer: Medicare Other | Admitting: Physical Therapy

## 2017-08-18 NOTE — Telephone Encounter (Signed)
He is busy today and feeling better - he r/s for next week

## 2017-08-19 DIAGNOSIS — D509 Iron deficiency anemia, unspecified: Secondary | ICD-10-CM | POA: Diagnosis not present

## 2017-08-19 DIAGNOSIS — Z992 Dependence on renal dialysis: Secondary | ICD-10-CM | POA: Diagnosis not present

## 2017-08-19 DIAGNOSIS — N186 End stage renal disease: Secondary | ICD-10-CM | POA: Diagnosis not present

## 2017-08-19 DIAGNOSIS — N2581 Secondary hyperparathyroidism of renal origin: Secondary | ICD-10-CM | POA: Diagnosis not present

## 2017-08-20 DIAGNOSIS — D509 Iron deficiency anemia, unspecified: Secondary | ICD-10-CM | POA: Diagnosis not present

## 2017-08-20 DIAGNOSIS — N186 End stage renal disease: Secondary | ICD-10-CM | POA: Diagnosis not present

## 2017-08-20 DIAGNOSIS — Z992 Dependence on renal dialysis: Secondary | ICD-10-CM | POA: Diagnosis not present

## 2017-08-20 DIAGNOSIS — N2581 Secondary hyperparathyroidism of renal origin: Secondary | ICD-10-CM | POA: Diagnosis not present

## 2017-08-24 DIAGNOSIS — Z992 Dependence on renal dialysis: Secondary | ICD-10-CM | POA: Diagnosis not present

## 2017-08-24 DIAGNOSIS — D509 Iron deficiency anemia, unspecified: Secondary | ICD-10-CM | POA: Diagnosis not present

## 2017-08-24 DIAGNOSIS — N2581 Secondary hyperparathyroidism of renal origin: Secondary | ICD-10-CM | POA: Diagnosis not present

## 2017-08-24 DIAGNOSIS — N186 End stage renal disease: Secondary | ICD-10-CM | POA: Diagnosis not present

## 2017-08-25 ENCOUNTER — Ambulatory Visit (HOSPITAL_COMMUNITY): Payer: Medicare Other | Admitting: Physical Therapy

## 2017-08-25 ENCOUNTER — Ambulatory Visit: Payer: Medicare Other | Admitting: Podiatry

## 2017-08-25 ENCOUNTER — Telehealth (HOSPITAL_COMMUNITY): Payer: Self-pay | Admitting: Physical Therapy

## 2017-08-25 NOTE — Telephone Encounter (Signed)
Pt is feeling better and wants to come in next week- r/s for Wed. 09/02/17. NF 08/25/17

## 2017-08-26 DIAGNOSIS — I871 Compression of vein: Secondary | ICD-10-CM | POA: Diagnosis not present

## 2017-08-26 DIAGNOSIS — Z992 Dependence on renal dialysis: Secondary | ICD-10-CM | POA: Diagnosis not present

## 2017-08-26 DIAGNOSIS — T82868A Thrombosis of vascular prosthetic devices, implants and grafts, initial encounter: Secondary | ICD-10-CM | POA: Diagnosis not present

## 2017-08-26 DIAGNOSIS — N186 End stage renal disease: Secondary | ICD-10-CM | POA: Diagnosis not present

## 2017-08-27 ENCOUNTER — Ambulatory Visit: Payer: Self-pay | Admitting: Family Medicine

## 2017-08-28 DIAGNOSIS — D509 Iron deficiency anemia, unspecified: Secondary | ICD-10-CM | POA: Diagnosis not present

## 2017-08-28 DIAGNOSIS — Z992 Dependence on renal dialysis: Secondary | ICD-10-CM | POA: Diagnosis not present

## 2017-08-28 DIAGNOSIS — N2581 Secondary hyperparathyroidism of renal origin: Secondary | ICD-10-CM | POA: Diagnosis not present

## 2017-08-28 DIAGNOSIS — N186 End stage renal disease: Secondary | ICD-10-CM | POA: Diagnosis not present

## 2017-08-28 IMAGING — CT CT PELVIS W/ CM
2 of 4 series · 16 of 46 positions shown, 18 images · IV contrast (Isovue)
Comparison: 11/01/2015 CT.

CLINICAL DATA: 50-year-old diabetic hypertensive male with abscess
and testicles for 2 days. Initial encounter.

EXAM:
CT PELVIS WITH CONTRAST
TECHNIQUE: Multidetector CT imaging of the pelvis was performed using the
standard protocol following the bolus administration of intravenous
contrast.
CONTRAST:  100mL 4EB11M-MRR IOPAMIDOL (4EB11M-MRR) INJECTION 61%

[Series 2: axial st · axial · 0.98mm/px · z∈[+652,+972]mm · 13 of 74 slices shown, 15 images]
[im 5/74  soft-tissue]
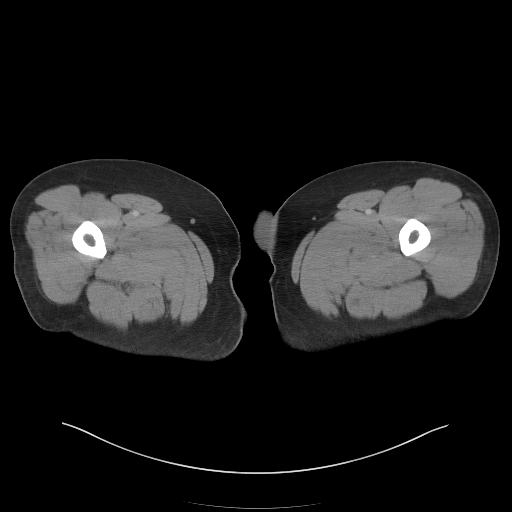
[im 5/74  bone]
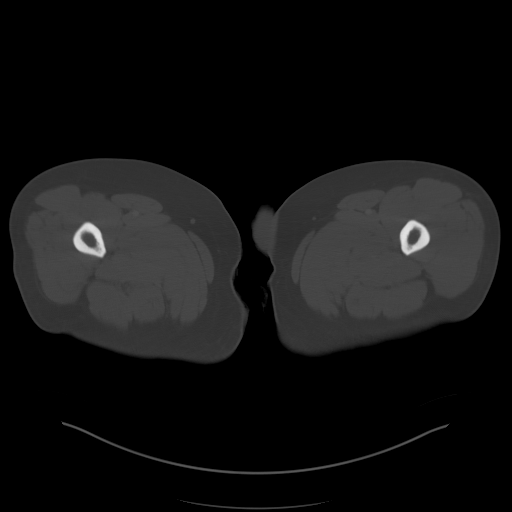
[im 10/74  soft-tissue]
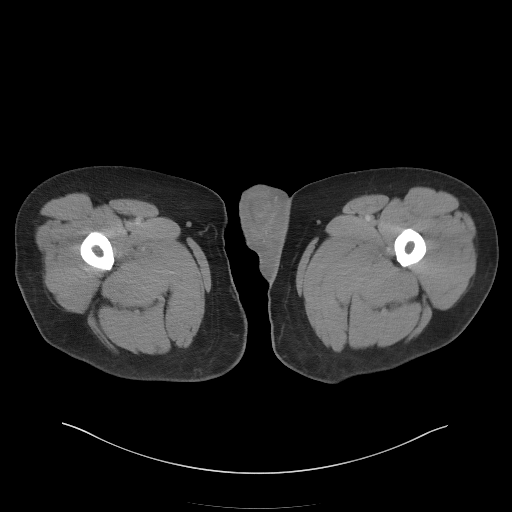
[im 15/74  soft-tissue]
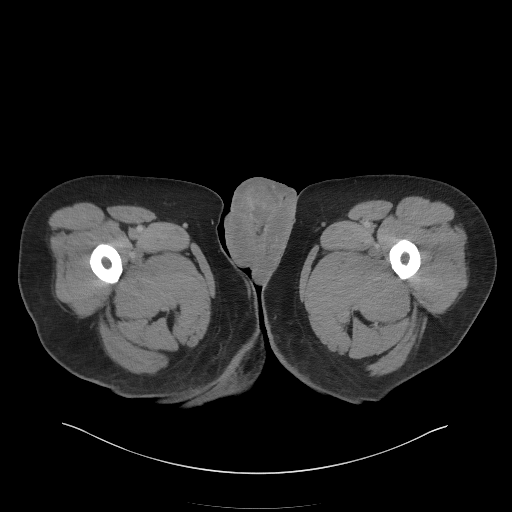
[im 20/74  soft-tissue]
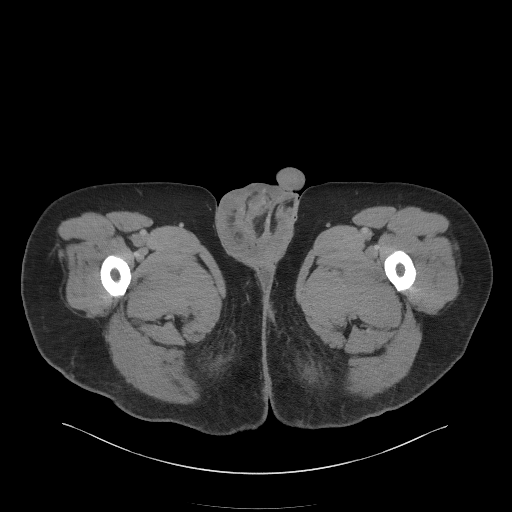
[im 25/74  soft-tissue]
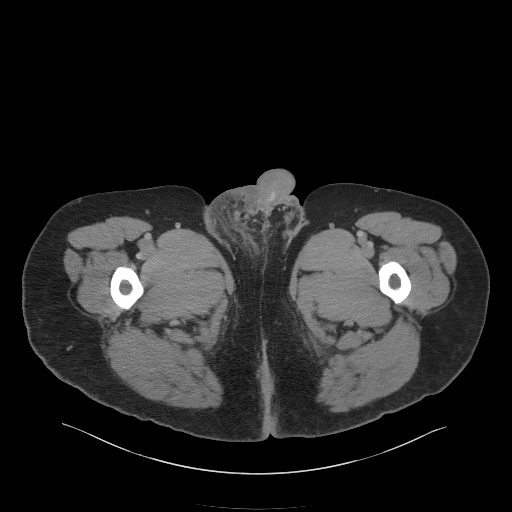
[im 30/74  soft-tissue]
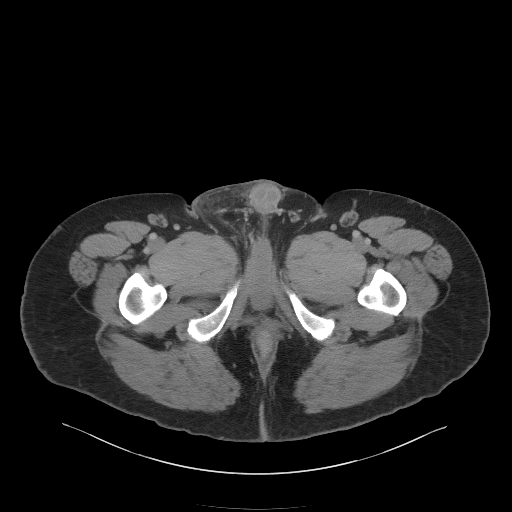
[im 39/74  soft-tissue]
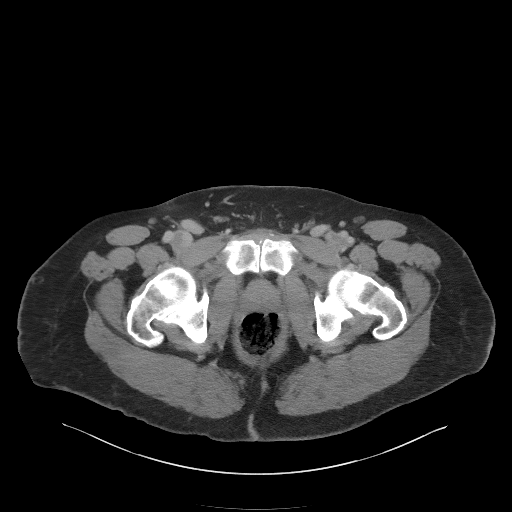
[im 44/74  soft-tissue]
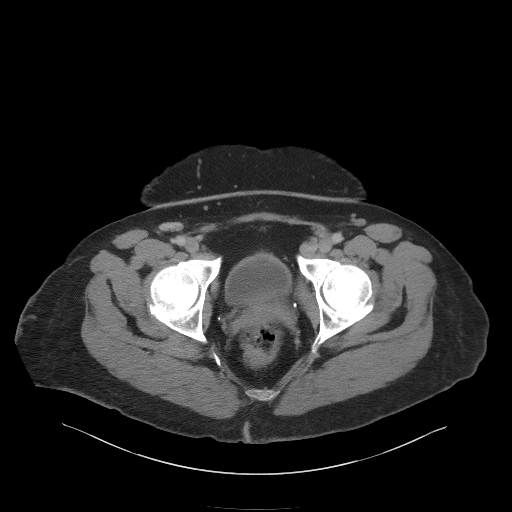
[im 49/74  soft-tissue]
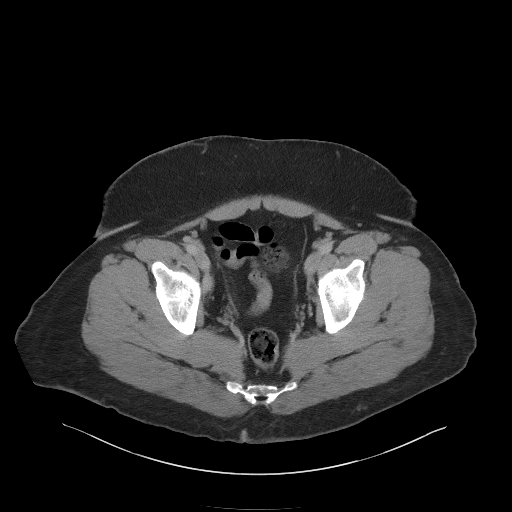
[im 49/74  bone]
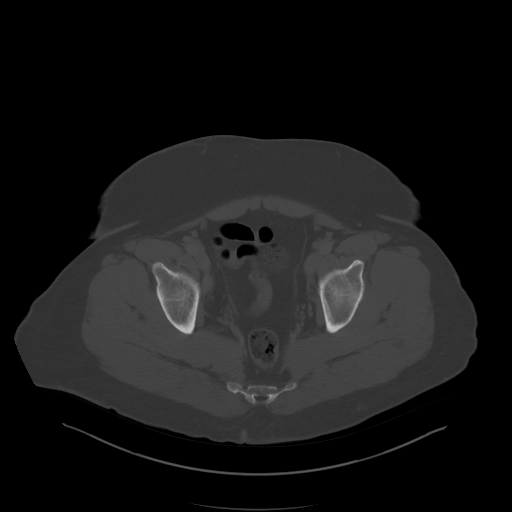
[im 54/74  soft-tissue]
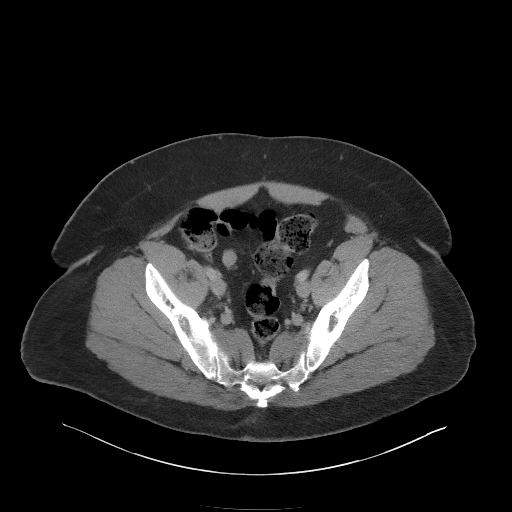
[im 59/74  soft-tissue]
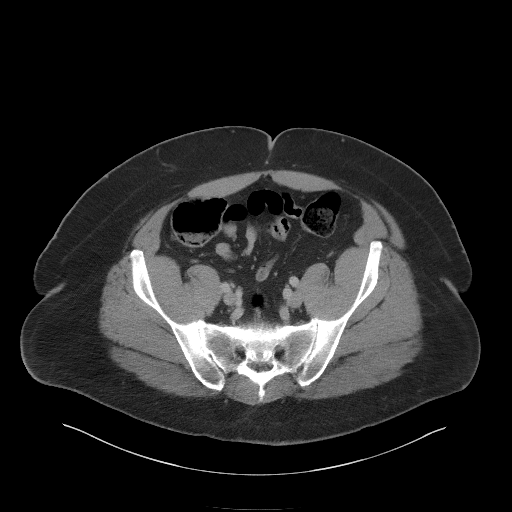
[im 64/74  soft-tissue]
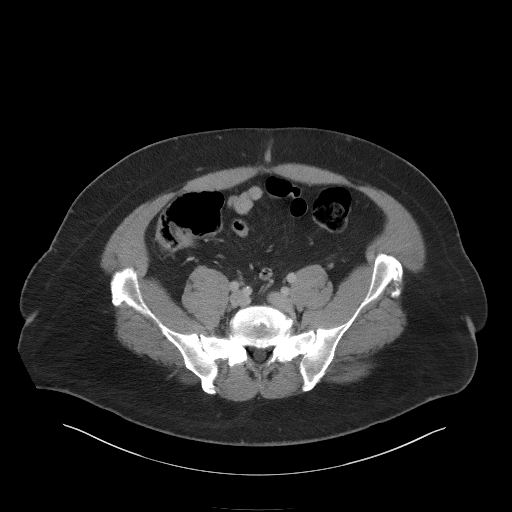
[im 69/74  soft-tissue]
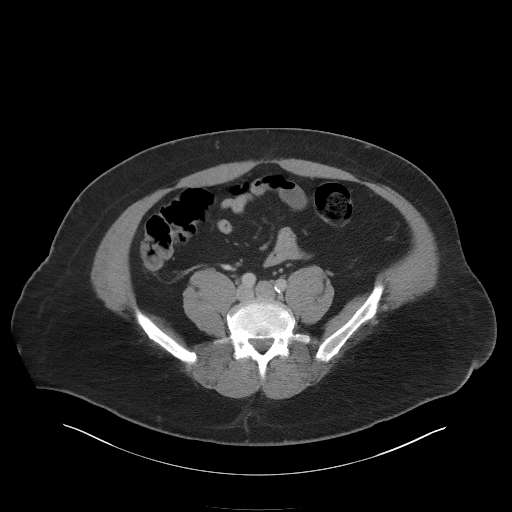

[Series 4: coronal st · coronal · 0.77mm/px · 3 of 101 slices shown]
[im 34/101  soft-tissue]
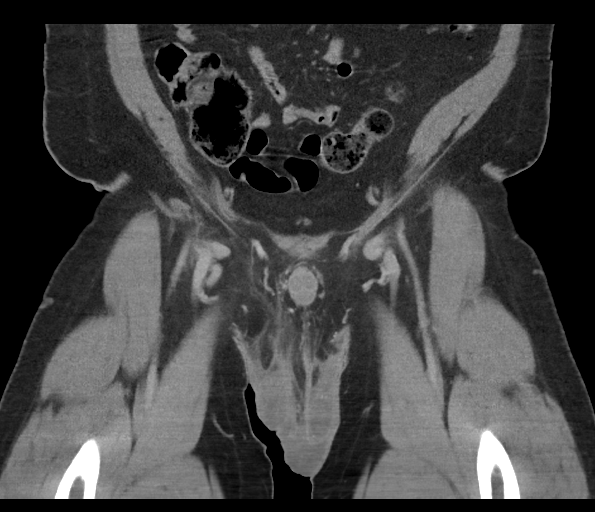
[im 45/101  soft-tissue]
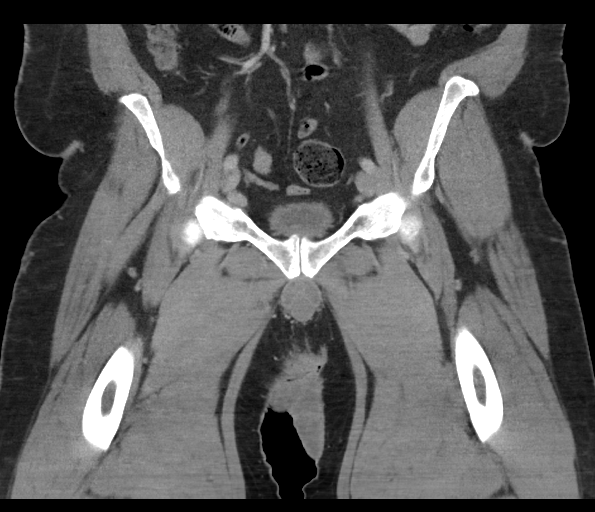
[im 56/101  soft-tissue]
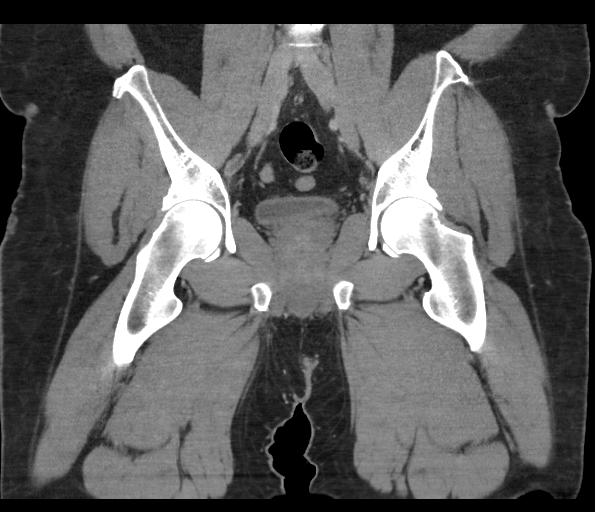

[16 of 46 positions shown; findings below may reference images not displayed]

FINDINGS: Urinary Tract: Contracted urinary bladder with circumferential
thickening. Slight impression upon the bladder base by prostate
gland.

Bowel: No extra luminal bowel inflammatory process. Left colon
diverticula.

Vascular/Lymphatic: Calcification left common iliac artery.

Reproductive: Abnormal appearance of the scrotum with diffuse
inflammation. Within the scrotum are ill-defined structures which
may represent combination of abscess and testicle. No gas however
this may represent Itagui gangrene. Inflammatory process extends
to the undersurface of the penis. Inflammation extends along the
inferior aspect of the inguinal canal greater on the right. Adjacent
reactive adenopathy.

Other:  None.

Musculoskeletal: Schmorl's node deformity inferior endplate L4 and
L5.
IMPRESSION: Abnormal appearance of the scrotum with diffuse inflammation. Within
the scrotum are ill-defined structures which may represent
combination of abscess and testicle. No gas however this may
represent Itagui gangrene. Inflammatory process extends to the
undersurface of the penis. Inflammation extends along the inferior
aspect of the inguinal canal greater on the right. Adjacent reactive
adenopathy.

These results were called by telephone at the time of interpretation
on 02/25/2016 at [DATE] to Dr. Qaq, who verbally acknowledged these
results.

## 2017-08-31 DIAGNOSIS — N2581 Secondary hyperparathyroidism of renal origin: Secondary | ICD-10-CM | POA: Diagnosis not present

## 2017-08-31 DIAGNOSIS — N186 End stage renal disease: Secondary | ICD-10-CM | POA: Diagnosis not present

## 2017-08-31 DIAGNOSIS — Z992 Dependence on renal dialysis: Secondary | ICD-10-CM | POA: Diagnosis not present

## 2017-08-31 DIAGNOSIS — D509 Iron deficiency anemia, unspecified: Secondary | ICD-10-CM | POA: Diagnosis not present

## 2017-09-02 ENCOUNTER — Ambulatory Visit: Payer: Self-pay | Admitting: Family Medicine

## 2017-09-02 ENCOUNTER — Ambulatory Visit (HOSPITAL_COMMUNITY): Payer: Medicare Other | Admitting: Physical Therapy

## 2017-09-02 DIAGNOSIS — D509 Iron deficiency anemia, unspecified: Secondary | ICD-10-CM | POA: Diagnosis not present

## 2017-09-02 DIAGNOSIS — N2581 Secondary hyperparathyroidism of renal origin: Secondary | ICD-10-CM | POA: Diagnosis not present

## 2017-09-02 DIAGNOSIS — N186 End stage renal disease: Secondary | ICD-10-CM | POA: Diagnosis not present

## 2017-09-02 DIAGNOSIS — Z992 Dependence on renal dialysis: Secondary | ICD-10-CM | POA: Diagnosis not present

## 2017-09-02 NOTE — Congregational Nurse Program (Signed)
Congregational Nurse Program Note  Date of Encounter: 09/02/2017  Past Medical History: Past Medical History:  Diagnosis Date  . Anemia   . Arthritis   . BPH (benign prostatic hyperplasia)   . Chronic back pain   . Difficulty walking   . DVT (deep venous thrombosis) ([place])    Right popliteal DVT December 2017  . End-stage renal disease on hemodialysis ([place])    Dr. Lowanda Foster  . Erectile dysfunction   . Essential hypertension   . Gout   . Neuropathy, diabetic ([place])   . Type 2 diabetes mellitus ([place])   . 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 Stated just came from dialysis. No problems or concerns [address] [place], [place] Program [phone]

## 2017-09-04 ENCOUNTER — Telehealth (HOSPITAL_COMMUNITY): Payer: Self-pay | Admitting: Physical Therapy

## 2017-09-04 DIAGNOSIS — D509 Iron deficiency anemia, unspecified: Secondary | ICD-10-CM | POA: Diagnosis not present

## 2017-09-04 DIAGNOSIS — Z992 Dependence on renal dialysis: Secondary | ICD-10-CM | POA: Diagnosis not present

## 2017-09-04 DIAGNOSIS — N186 End stage renal disease: Secondary | ICD-10-CM | POA: Diagnosis not present

## 2017-09-04 DIAGNOSIS — N2581 Secondary hyperparathyroidism of renal origin: Secondary | ICD-10-CM | POA: Diagnosis not present

## 2017-09-04 DIAGNOSIS — R112 Nausea with vomiting, unspecified: Secondary | ICD-10-CM | POA: Diagnosis not present

## 2017-09-04 NOTE — Telephone Encounter (Signed)
L/m with pt that all apptments have been cx and he has been d/c per [facility]. If he needs to come back please get another referral.

## 2017-09-07 DIAGNOSIS — N2581 Secondary hyperparathyroidism of renal origin: Secondary | ICD-10-CM | POA: Diagnosis not present

## 2017-09-07 DIAGNOSIS — R112 Nausea with vomiting, unspecified: Secondary | ICD-10-CM | POA: Diagnosis not present

## 2017-09-07 DIAGNOSIS — D509 Iron deficiency anemia, unspecified: Secondary | ICD-10-CM | POA: Diagnosis not present

## 2017-09-07 DIAGNOSIS — N186 End stage renal disease: Secondary | ICD-10-CM | POA: Diagnosis not present

## 2017-09-07 DIAGNOSIS — Z992 Dependence on renal dialysis: Secondary | ICD-10-CM | POA: Diagnosis not present

## 2017-09-07 IMAGING — CR DG ABD PORTABLE 1V
1 series · 2 of 2 positions shown · non-contrast
Comparison: CT 02/25/2016.  CT 11/01/2015.

CLINICAL DATA: Constipation .

EXAM:
PORTABLE ABDOMEN - 1 VIEW

[Series 1: supine ap · 0.17mm/px · 2 of 2 slices shown]
[im 1/2]
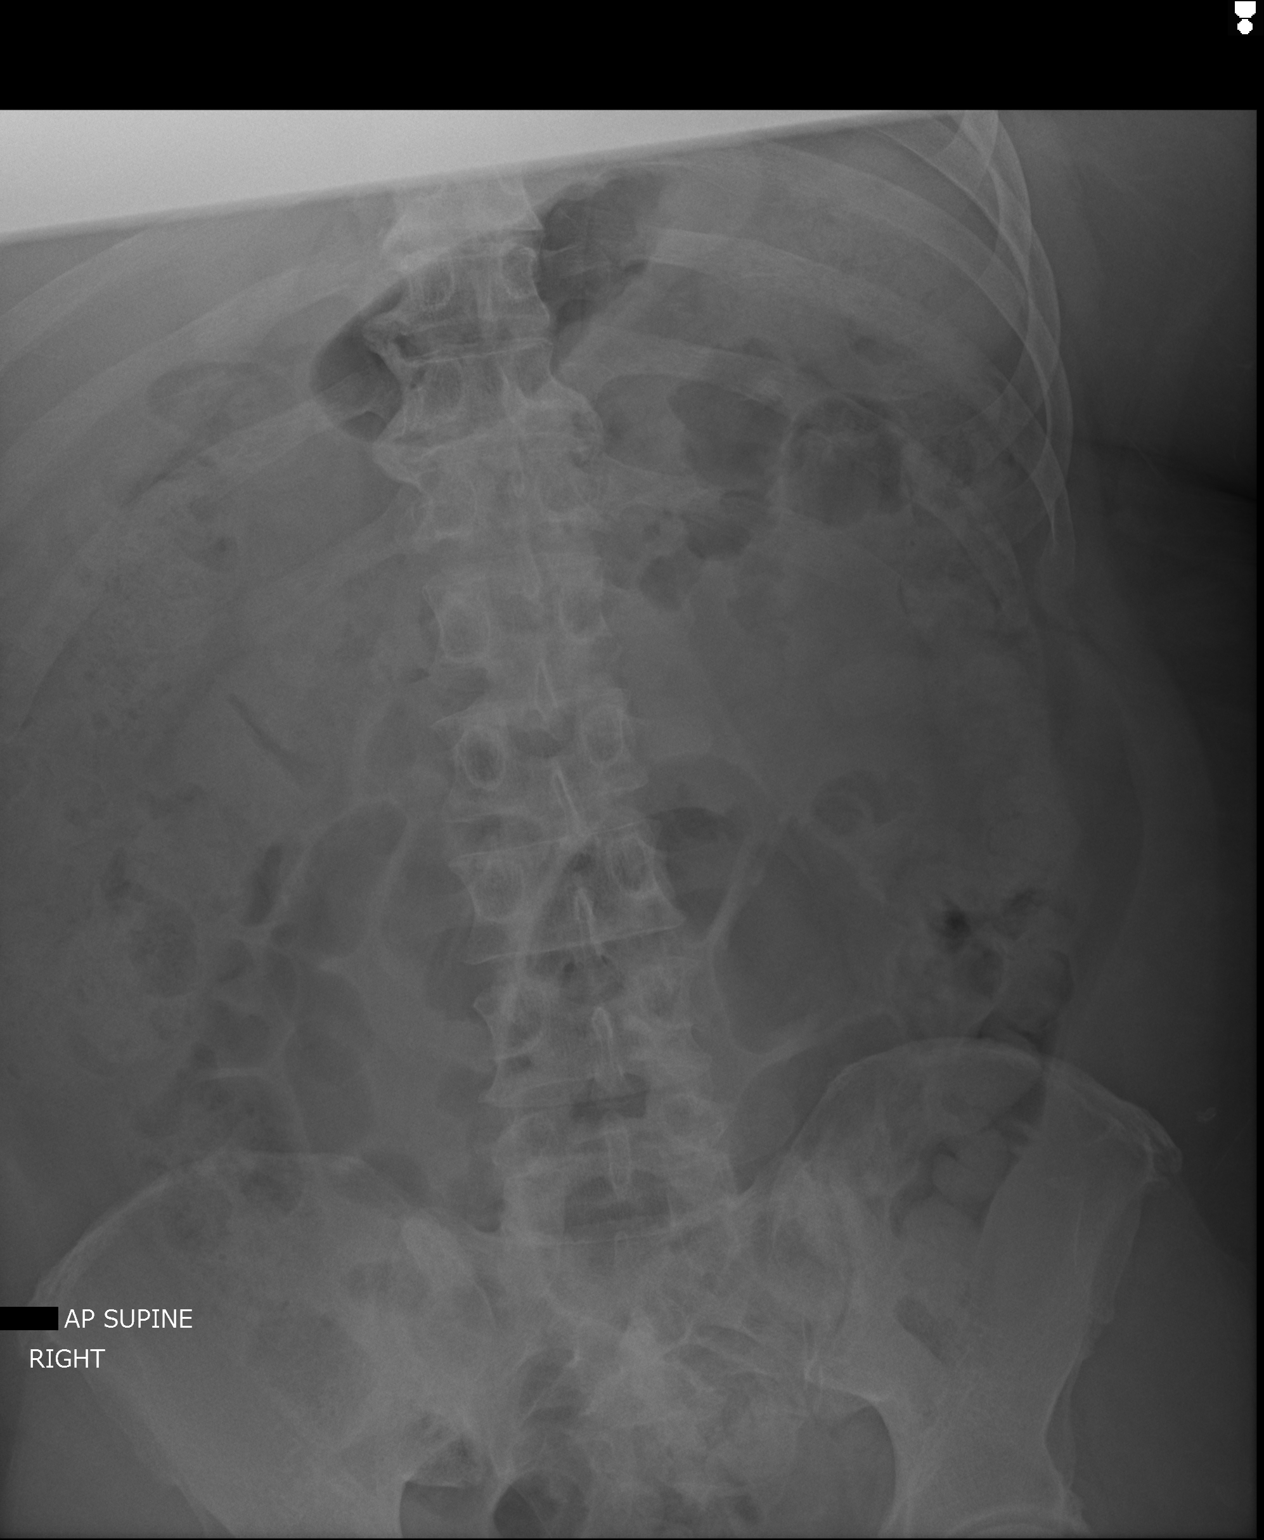
[im 2/2]
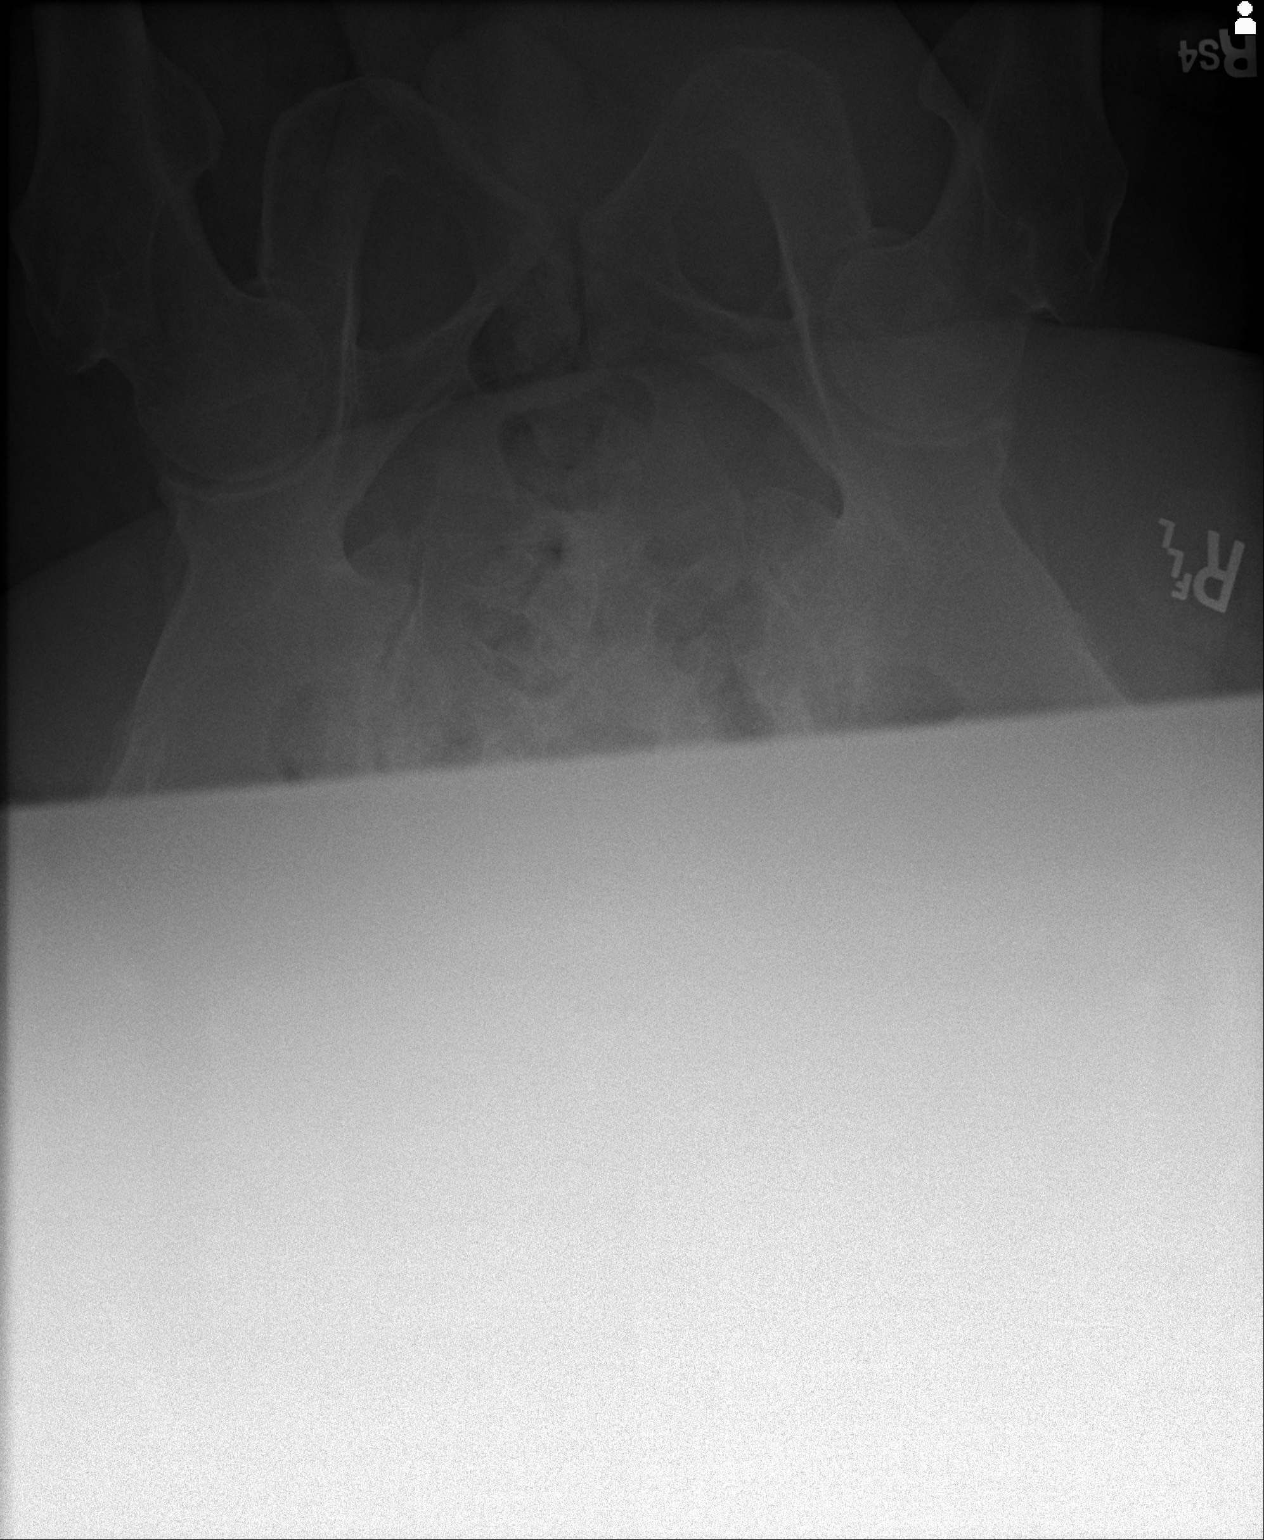

[2 of 2 positions shown; findings below may reference images not displayed]

FINDINGS: Soft tissue structures are unremarkable. Prominent amount of stool
noted throughout the colon suggesting constipation. No prominent
bowel distention. No free air. Degenerative changes thoracolumbar
spine
IMPRESSION: 1. Prominent amount of stool noted throughout the colon consistent
with constipation.

2.  Degenerative changes thoracolumbar spine.

## 2017-09-10 ENCOUNTER — Ambulatory Visit: Payer: Self-pay | Admitting: Family Medicine

## 2017-09-10 DIAGNOSIS — N186 End stage renal disease: Secondary | ICD-10-CM | POA: Diagnosis not present

## 2017-09-10 DIAGNOSIS — Z992 Dependence on renal dialysis: Secondary | ICD-10-CM | POA: Diagnosis not present

## 2017-09-10 DIAGNOSIS — N2581 Secondary hyperparathyroidism of renal origin: Secondary | ICD-10-CM | POA: Diagnosis not present

## 2017-09-14 DIAGNOSIS — N186 End stage renal disease: Secondary | ICD-10-CM | POA: Diagnosis not present

## 2017-09-14 DIAGNOSIS — R112 Nausea with vomiting, unspecified: Secondary | ICD-10-CM | POA: Diagnosis not present

## 2017-09-14 DIAGNOSIS — T82868A Thrombosis of vascular prosthetic devices, implants and grafts, initial encounter: Secondary | ICD-10-CM | POA: Diagnosis not present

## 2017-09-14 DIAGNOSIS — I871 Compression of vein: Secondary | ICD-10-CM | POA: Diagnosis not present

## 2017-09-14 DIAGNOSIS — D509 Iron deficiency anemia, unspecified: Secondary | ICD-10-CM | POA: Diagnosis not present

## 2017-09-14 DIAGNOSIS — Z992 Dependence on renal dialysis: Secondary | ICD-10-CM | POA: Diagnosis not present

## 2017-09-14 DIAGNOSIS — N2581 Secondary hyperparathyroidism of renal origin: Secondary | ICD-10-CM | POA: Diagnosis not present

## 2017-09-15 DIAGNOSIS — Z992 Dependence on renal dialysis: Secondary | ICD-10-CM | POA: Diagnosis not present

## 2017-09-15 DIAGNOSIS — N186 End stage renal disease: Secondary | ICD-10-CM | POA: Diagnosis not present

## 2017-09-15 DIAGNOSIS — N2581 Secondary hyperparathyroidism of renal origin: Secondary | ICD-10-CM | POA: Diagnosis not present

## 2017-09-15 DIAGNOSIS — D509 Iron deficiency anemia, unspecified: Secondary | ICD-10-CM | POA: Diagnosis not present

## 2017-09-15 DIAGNOSIS — R112 Nausea with vomiting, unspecified: Secondary | ICD-10-CM | POA: Diagnosis not present

## 2017-09-16 DIAGNOSIS — R112 Nausea with vomiting, unspecified: Secondary | ICD-10-CM | POA: Diagnosis not present

## 2017-09-16 DIAGNOSIS — Z992 Dependence on renal dialysis: Secondary | ICD-10-CM | POA: Diagnosis not present

## 2017-09-16 DIAGNOSIS — N2581 Secondary hyperparathyroidism of renal origin: Secondary | ICD-10-CM | POA: Diagnosis not present

## 2017-09-16 DIAGNOSIS — D509 Iron deficiency anemia, unspecified: Secondary | ICD-10-CM | POA: Diagnosis not present

## 2017-09-16 DIAGNOSIS — N186 End stage renal disease: Secondary | ICD-10-CM | POA: Diagnosis not present

## 2017-09-17 ENCOUNTER — Encounter (HOSPITAL_COMMUNITY)
Admission: RE | Admit: 2017-09-17 | Discharge: 2017-09-17 | Disposition: A | Discharge status: Home / Self Care | Payer: Medicare Other | Source: Ambulatory Visit | Attending: Urology | Admitting: Urology

## 2017-09-17 ENCOUNTER — Ambulatory Visit: Payer: Self-pay | Admitting: Family Medicine

## 2017-09-18 DIAGNOSIS — N2581 Secondary hyperparathyroidism of renal origin: Secondary | ICD-10-CM | POA: Diagnosis not present

## 2017-09-18 DIAGNOSIS — Z992 Dependence on renal dialysis: Secondary | ICD-10-CM | POA: Diagnosis not present

## 2017-09-18 DIAGNOSIS — D509 Iron deficiency anemia, unspecified: Secondary | ICD-10-CM | POA: Diagnosis not present

## 2017-09-18 DIAGNOSIS — N186 End stage renal disease: Secondary | ICD-10-CM | POA: Diagnosis not present

## 2017-09-18 DIAGNOSIS — R112 Nausea with vomiting, unspecified: Secondary | ICD-10-CM | POA: Diagnosis not present

## 2017-09-21 ENCOUNTER — Ambulatory Visit (HOSPITAL_COMMUNITY): Payer: Medicare Other | Admitting: Physical Therapy

## 2017-09-21 DIAGNOSIS — Z992 Dependence on renal dialysis: Secondary | ICD-10-CM | POA: Diagnosis not present

## 2017-09-21 DIAGNOSIS — N186 End stage renal disease: Secondary | ICD-10-CM | POA: Diagnosis not present

## 2017-09-21 DIAGNOSIS — N2581 Secondary hyperparathyroidism of renal origin: Secondary | ICD-10-CM | POA: Diagnosis not present

## 2017-09-21 DIAGNOSIS — R112 Nausea with vomiting, unspecified: Secondary | ICD-10-CM | POA: Diagnosis not present

## 2017-09-21 DIAGNOSIS — D509 Iron deficiency anemia, unspecified: Secondary | ICD-10-CM | POA: Diagnosis not present

## 2017-09-22 ENCOUNTER — Encounter (HOSPITAL_COMMUNITY): Payer: Self-pay

## 2017-09-23 ENCOUNTER — Ambulatory Visit (HOSPITAL_COMMUNITY): Payer: Medicare Other | Admitting: Anesthesiology

## 2017-09-23 ENCOUNTER — Encounter (HOSPITAL_COMMUNITY)
Admission: RE | Disposition: A | Discharge status: Home / Self Care | Payer: Self-pay | Source: Ambulatory Visit | Attending: Urology

## 2017-09-23 ENCOUNTER — Ambulatory Visit (HOSPITAL_COMMUNITY)
Admission: RE | Admit: 2017-09-23 | Discharge: 2017-09-23 | Disposition: A | Discharge status: Home / Self Care | Payer: Medicare Other | Source: Ambulatory Visit | Attending: Urology | Admitting: Urology

## 2017-09-23 DIAGNOSIS — N401 Enlarged prostate with lower urinary tract symptoms: Secondary | ICD-10-CM | POA: Diagnosis not present

## 2017-09-23 DIAGNOSIS — Z992 Dependence on renal dialysis: Secondary | ICD-10-CM | POA: Insufficient documentation

## 2017-09-23 DIAGNOSIS — Z8249 Family history of ischemic heart disease and other diseases of the circulatory system: Secondary | ICD-10-CM | POA: Insufficient documentation

## 2017-09-23 DIAGNOSIS — I120 Hypertensive chronic kidney disease with stage 5 chronic kidney disease or end stage renal disease: Secondary | ICD-10-CM | POA: Insufficient documentation

## 2017-09-23 DIAGNOSIS — I10 Essential (primary) hypertension: Secondary | ICD-10-CM | POA: Diagnosis not present

## 2017-09-23 DIAGNOSIS — N3289 Other specified disorders of bladder: Secondary | ICD-10-CM | POA: Diagnosis not present

## 2017-09-23 DIAGNOSIS — Z86718 Personal history of other venous thrombosis and embolism: Secondary | ICD-10-CM | POA: Insufficient documentation

## 2017-09-23 DIAGNOSIS — Z881 Allergy status to other antibiotic agents status: Secondary | ICD-10-CM | POA: Diagnosis not present

## 2017-09-23 DIAGNOSIS — R310 Gross hematuria: Secondary | ICD-10-CM | POA: Insufficient documentation

## 2017-09-23 DIAGNOSIS — I872 Venous insufficiency (chronic) (peripheral): Secondary | ICD-10-CM | POA: Insufficient documentation

## 2017-09-23 DIAGNOSIS — N2889 Other specified disorders of kidney and ureter: Secondary | ICD-10-CM | POA: Diagnosis not present

## 2017-09-23 DIAGNOSIS — E1122 Type 2 diabetes mellitus with diabetic chronic kidney disease: Secondary | ICD-10-CM | POA: Insufficient documentation

## 2017-09-23 DIAGNOSIS — E1140 Type 2 diabetes mellitus with diabetic neuropathy, unspecified: Secondary | ICD-10-CM | POA: Insufficient documentation

## 2017-09-23 DIAGNOSIS — E119 Type 2 diabetes mellitus without complications: Secondary | ICD-10-CM | POA: Diagnosis not present

## 2017-09-23 DIAGNOSIS — N186 End stage renal disease: Secondary | ICD-10-CM | POA: Insufficient documentation

## 2017-09-23 DIAGNOSIS — M1990 Unspecified osteoarthritis, unspecified site: Secondary | ICD-10-CM | POA: Insufficient documentation

## 2017-09-23 DIAGNOSIS — D649 Anemia, unspecified: Secondary | ICD-10-CM | POA: Diagnosis not present

## 2017-09-23 HISTORY — PX: CYSTOSCOPY: SHX5120

## 2017-09-23 LAB — POCT I-STAT, CHEM 8
BUN: 50 mg/dL — AB (ref 6–20)
CHLORIDE: 103 mmol/L (ref 98–111)
CREATININE: 16.7 mg/dL — AB (ref 0.61–1.24)
Calcium, Ion: 1.02 mmol/L — ABNORMAL LOW (ref 1.15–1.40)
GLUCOSE: 78 mg/dL (ref 70–99)
HCT: 48 % (ref 39.0–52.0)
HEMOGLOBIN: 16.3 g/dL (ref 13.0–17.0)
POTASSIUM: 4.9 mmol/L (ref 3.5–5.1)
Sodium: 137 mmol/L (ref 135–145)
TCO2: 25 mmol/L (ref 22–32)

## 2017-09-23 LAB — GLUCOSE, CAPILLARY: Glucose-Capillary: 84 mg/dL (ref 70–99)

## 2017-09-23 SURGERY — CYSTOSCOPY
Anesthesia: Monitor Anesthesia Care | Site: Penis

## 2017-09-23 MED ORDER — PROPOFOL 10 MG/ML IV BOLUS
INTRAVENOUS | Status: DC | PRN
  Administered 2017-09-23 (×2): 20 mg via INTRAVENOUS
  Administered 2017-09-23: 40 mg via INTRAVENOUS
  Administered 2017-09-23: 20 mg via INTRAVENOUS

## 2017-09-23 MED ORDER — MIDAZOLAM HCL 2 MG/2ML IJ SOLN
INTRAMUSCULAR | Status: AC
  Filled 2017-09-23: qty 2

## 2017-09-23 MED ORDER — PROPOFOL 10 MG/ML IV BOLUS
INTRAVENOUS | Status: AC
  Filled 2017-09-23: qty 20

## 2017-09-23 MED ORDER — PROMETHAZINE HCL 25 MG/ML IJ SOLN: 6.2500 mg | INTRAMUSCULAR | Status: DC | PRN

## 2017-09-23 MED ORDER — FENTANYL CITRATE (PF) 100 MCG/2ML IJ SOLN
INTRAMUSCULAR | Status: DC | PRN
  Administered 2017-09-23: 25 ug via INTRAVENOUS

## 2017-09-23 MED ORDER — LIDOCAINE HCL URETHRAL/MUCOSAL 2 % EX GEL
CUTANEOUS | Status: DC | PRN
  Administered 2017-09-23: 1 via URETHRAL

## 2017-09-23 MED ORDER — STERILE WATER FOR IRRIGATION IR SOLN
Status: DC | PRN
  Administered 2017-09-23: 1000 mL
  Administered 2017-09-23: 3000 mL

## 2017-09-23 MED ORDER — HYDROCODONE-ACETAMINOPHEN 7.5-325 MG PO TABS: 1.0000 | ORAL_TABLET | Freq: Once | ORAL | Status: DC | PRN

## 2017-09-23 MED ORDER — MEPERIDINE HCL 50 MG/ML IJ SOLN: 6.2500 mg | INTRAMUSCULAR | Status: DC | PRN

## 2017-09-23 MED ORDER — FENTANYL CITRATE (PF) 100 MCG/2ML IJ SOLN
INTRAMUSCULAR | Status: AC
  Filled 2017-09-23: qty 2

## 2017-09-23 MED ORDER — MIDAZOLAM HCL 5 MG/5ML IJ SOLN
INTRAMUSCULAR | Status: DC | PRN
  Administered 2017-09-23: 0.5 mg via INTRAVENOUS

## 2017-09-23 MED ORDER — PROPOFOL 10 MG/ML IV BOLUS
INTRAVENOUS | Status: AC
  Filled 2017-09-23: qty 40

## 2017-09-23 MED ORDER — PROPOFOL 500 MG/50ML IV EMUL
INTRAVENOUS | Status: DC | PRN
  Administered 2017-09-23: 125 ug/kg/min via INTRAVENOUS
  Administered 2017-09-23: 100 ug/kg/min via INTRAVENOUS

## 2017-09-23 MED ORDER — LACTATED RINGERS IV SOLN: INTRAVENOUS | Status: DC

## 2017-09-23 MED ORDER — SODIUM CHLORIDE 0.9% FLUSH
INTRAVENOUS | Status: AC
  Filled 2017-09-23: qty 20

## 2017-09-23 MED ORDER — CEFAZOLIN SODIUM-DEXTROSE 2-4 GM/100ML-% IV SOLN
2.0000 g | INTRAVENOUS | Status: AC
  Administered 2017-09-23: 2 g via INTRAVENOUS
  Filled 2017-09-23: qty 100

## 2017-09-23 MED ORDER — TRAMADOL HCL 50 MG PO TABS: 50.0000 mg | ORAL_TABLET | Freq: Four times a day (QID) | ORAL | 0 refills | Status: DC | PRN

## 2017-09-23 MED ORDER — LACTATED RINGERS IV SOLN
INTRAVENOUS | Status: DC
  Administered 2017-09-23: 12:00:00 via INTRAVENOUS

## 2017-09-23 MED ORDER — LIDOCAINE HCL URETHRAL/MUCOSAL 2 % EX GEL
CUTANEOUS | Status: AC
  Filled 2017-09-23: qty 10

## 2017-09-23 MED ORDER — HYDROMORPHONE HCL 1 MG/ML IJ SOLN: 0.2500 mg | INTRAMUSCULAR | Status: DC | PRN

## 2017-09-23 SURGICAL SUPPLY — 19 items
BAG DRAIN URO TABLE W/ADPT NS (BAG) ×3 IMPLANT
BAG DRN 8 ADPR NS SKTRN CSTL (BAG) ×2
CATH INTERMIT  6FR 70CM (CATHETERS) IMPLANT
CLOTH BEACON ORANGE TIMEOUT ST (SAFETY) ×3 IMPLANT
GLOVE BIO SURGEON STRL SZ7.5 (GLOVE) ×3 IMPLANT
GOWN STRL REUS W/ TWL LRG LVL3 (GOWN DISPOSABLE) ×4 IMPLANT
GOWN STRL REUS W/ TWL XL LVL3 (GOWN DISPOSABLE) ×2 IMPLANT
GOWN STRL REUS W/TWL LRG LVL3 (GOWN DISPOSABLE) ×6
GOWN STRL REUS W/TWL XL LVL3 (GOWN DISPOSABLE) ×3
IV NS IRRIG 3000ML ARTHROMATIC (IV SOLUTION) ×3 IMPLANT
KIT TURNOVER CYSTO (KITS) ×3 IMPLANT
MANIFOLD NEPTUNE II (INSTRUMENTS) ×2 IMPLANT
NDL HYPO 18GX1.5 BLUNT FILL (NEEDLE) ×1 IMPLANT
NEEDLE HYPO 18GX1.5 BLUNT FILL (NEEDLE) ×3 IMPLANT
NS IRRIG 1000ML POUR BTL (IV SOLUTION) ×2 IMPLANT
PACK CYSTO (CUSTOM PROCEDURE TRAY) ×3 IMPLANT
PAD ARMBOARD 7.5X6 YLW CONV (MISCELLANEOUS) ×3 IMPLANT
PAD TELFA 3X4 1S STER (GAUZE/BANDAGES/DRESSINGS) ×3 IMPLANT
WATER STERILE IRR 3000ML UROMA (IV SOLUTION) ×3 IMPLANT

## 2017-09-23 NOTE — Anesthesia Procedure Notes (Signed)
Procedure Name: MAC Date/Time: 09/23/2017 1:05 PM Performed by: Vista Deck, CRNA Pre-anesthesia Checklist: Patient identified, Emergency Drugs available, Suction available, Timeout performed and Patient being monitored Patient Re-evaluated:Patient Re-evaluated prior to induction Oxygen Delivery Method: Nasal Cannula

## 2017-09-23 NOTE — Anesthesia Preprocedure Evaluation (Signed)
Anesthesia Evaluation  Patient identified by MRN, date of birth, ID band Patient awake    Reviewed: Allergy & Precautions, H&P , NPO status , Patient's Chart, lab work & pertinent test results, reviewed documented beta blocker date and time   Airway Mallampati: IV  TM Distance: >3 FB Neck ROM: full    Dental no notable dental hx. (+) Teeth Intact, Dental Advidsory Given   Pulmonary neg pulmonary ROS,    Pulmonary exam normal breath sounds clear to auscultation       Cardiovascular Exercise Tolerance: Good hypertension, On Medications negative cardio ROS   Rhythm:regular Rate:Normal     Neuro/Psych negative neurological ROS  negative psych ROS   GI/Hepatic negative GI ROS, Neg liver ROS,   Endo/Other  negative endocrine ROSdiabetes, Type 2  Renal/GU Dialysis and ESRFnegative Renal ROS  negative genitourinary   Musculoskeletal   Abdominal   Peds  Hematology negative hematology ROS (+) anemia ,   Anesthesia Other Findings HD pt DM without neuropathy Denies SZ/CVA hx   Reproductive/Obstetrics negative OB ROS                             Anesthesia Physical Anesthesia Plan  ASA: IV  Anesthesia Plan: MAC   Post-op Pain Management:    Induction:   PONV Risk Score and Plan:   Airway Management Planned:   Additional Equipment:   Intra-op Plan:   Post-operative Plan:   Informed Consent: I have reviewed the patients History and Physical, chart, labs and discussed the procedure including the risks, benefits and alternatives for the proposed anesthesia with the patient or authorized representative who has indicated his/her understanding and acceptance.   Dental Advisory Given  Plan Discussed with: CRNA and Anesthesiologist  Anesthesia Plan Comments:         Anesthesia Quick Evaluation

## 2017-09-23 NOTE — Anesthesia Postprocedure Evaluation (Signed)
Anesthesia Post Note  Patient: Jack Irwin  Procedure(s) Performed: CYSTOSCOPY (N/A Penis)  Patient location during evaluation: Short Stay Anesthesia Type: MAC Level of consciousness: awake and alert and patient cooperative Pain management: satisfactory to patient Vital Signs Assessment: post-procedure vital signs reviewed and stable Respiratory status: spontaneous breathing Cardiovascular status: stable Postop Assessment: no apparent nausea or vomiting Anesthetic complications: no     Last Vitals:  Vitals:   09/23/17 1400 09/23/17 1409  BP: (!) 111/92 119/79  Pulse: 96 88  Resp: (!) 24 (!) 24  Temp:  36.6 C  SpO2: 97% 98%    Last Pain:  Vitals:   09/23/17 1409  TempSrc: Oral  PainSc: 0-No pain                 Elver Stadler

## 2017-09-23 NOTE — H&P (Signed)
Urology Admission H&P  Chief Complaint: gross hematuria  History of Present Illness: [name] is a 52yo with a hx of ESRD, and gross hematuria. He was unable to tolerate office cystoscopy. He had mild LUTS at baseline. No fevers/chills/sweats. No UTIs.  Past Medical History:  Diagnosis Date  . Anemia   . Arthritis   . BPH (benign prostatic hyperplasia)   . Chronic back pain   . Difficulty walking   . DVT (deep venous thrombosis) ([place])    Right popliteal DVT December 2017  . End-stage renal disease on hemodialysis ([place])    Dr. Lowanda Foster  . Erectile dysfunction   . Essential hypertension   . Gout   . Neuropathy, diabetic ([place])   . Type 2 diabetes mellitus ([place])   . Venous (peripheral) insufficiency    Past Surgical History:  Procedure Laterality Date  . AV FISTULA PLACEMENT Left 03/09/2014   Procedure: INSERTION OF ARTERIOVENOUS (AV) GORE-TEX GRAFT ARM;  Surgeon: Angelia Mould, MD;  Location: [facility] OR;  Service: Vascular;  Laterality: Left;  . AV FISTULA PLACEMENT Right 05/06/2016   Procedure: INSERTION OF ARTERIOVENOUS (AV) GORE-TEX GRAFT  Right ARM;  Surgeon: Elam Dutch, MD;  Location: [place];  Service: Vascular;  Laterality: Right;  . CYST EXCISION Right    cyst removed on thumb 2001  . INSERTION OF DIALYSIS CATHETER    . INSERTION OF DIALYSIS CATHETER Left 03/09/2014   Procedure: INSERTION OF DIALYSIS CATHETER;  Surgeon: Angelia Mould, MD;  Location: [place];  Service: Vascular;  Laterality: Left;  . IR THROMBECTOMY AV FISTULA W/THROMBOLYSIS/PTA INC/SHUNT/IMG RIGHT Right 11/01/2016  . IR US GUIDE VASC ACCESS RIGHT  11/01/2016  . IRRIGATION AND DEBRIDEMENT ABSCESS N/A 02/25/2016   Procedure: IRRIGATION AND DEBRIDEMENT SCROTAL ABSCESS;  Surgeon: Nickie Retort, MD;  [facility];  Service: Urology;  Laterality: N/A;    Home Medications:  Current Facility-Administered Medications  Medication Dose Route Frequency Provider Last Rate Last Dose  . ceFAZolin  (ANCEF) IVPB 2g/100 mL premix  2 g Intravenous 30 min Pre-Op Cleon Gustin, MD      . lactated ringers infusion   Intravenous Continuous Vena Rua, MD 50 mL/hr at 09/23/17 1207     Allergies:  Allergies  Allergen Reactions  . Doxycycline Itching    Family History  Problem Relation Age of Onset  . Diabetes Mother   . Hypertension Mother   . Heart failure Mother   . Hyperlipidemia Mother   . Heart attack Mother   . Cancer Father   . Diabetes Father   . Hypertension Father   . Hyperlipidemia Father    Social History:  reports that he has never smoked. He has never used smokeless tobacco. He reports that he does not drink alcohol or use drugs.  Review of Systems  Genitourinary: Positive for hematuria.  All other systems reviewed and are negative.   Physical Exam:  Vital signs in last 24 hours: Temp:  [98.6 F (37 C)] 98.6 F (37 C) (08/21 1136) Pulse Rate:  [85] 85 (08/21 1136) Resp:  [18] 18 (08/21 1137) BP: (138)/(97) 138/97 (08/21 1140) SpO2:  [97 %] 97 % (08/21 1137) Weight:  [154.0 kg] 118.8 kg (08/21 1137) Physical Exam  Constitutional: He is oriented to person, place, and time. He appears well-developed and well-nourished.  HENT:  Head: Normocephalic and atraumatic.  Eyes: Pupils are equal, round, and reactive to light. EOM are normal.  Neck: Normal range of motion. No thyromegaly  present.  Cardiovascular: Normal rate and regular rhythm.  Respiratory: Effort normal. No respiratory distress.  GI: Soft. He exhibits no distension.  Musculoskeletal: Normal range of motion. He exhibits no edema.  Neurological: He is alert and oriented to person, place, and time.  Skin: Skin is warm and dry.  Psychiatric: He has a normal mood and affect. His behavior is normal. Judgment and thought content normal.    Laboratory Data:  Results for orders placed or performed during the hospital encounter of 09/23/17 (from the past 24 hour(s))  I-STAT, chem 8      Status: Abnormal   Collection Time: 09/23/17 11:26 AM  Result Value Ref Range   Sodium 137 135 - 145 mmol/L   Potassium 4.9 3.5 - 5.1 mmol/L   Chloride 103 98 - 111 mmol/L   BUN 50 (H) 6 - 20 mg/dL   Creatinine, Ser 16.70 (H) 0.61 - 1.24 mg/dL   Glucose, Bld 78 70 - 99 mg/dL   Calcium, Ion 1.02 (L) 1.15 - 1.40 mmol/L   TCO2 25 22 - 32 mmol/L   Hemoglobin 16.3 13.0 - 17.0 g/dL   HCT 48.0 39.0 - 52.0 %   No results found for this or any previous visit (from the past 240 hour(s)). Creatinine: Recent Labs    09/23/17 1126  CREATININE 16.70*   Baseline Creatinine: NA  Impression/Assessment:  52yo with gross hematuria  Plan:  The risks/benefits/alternatives to cystoscopy with possible bladder biopsy was explained to the patient and he understands and wishes to proceed with surgery  Nicolette Bang 09/23/2017, 1:00 PM

## 2017-09-23 NOTE — Discharge Instructions (Signed)
Cystoscopy, Care After Refer to this sheet in the next few weeks. These instructions provide you with information about caring for yourself after your procedure. Your health care provider may also give you more specific instructions. Your treatment has been planned according to current medical practices, but problems sometimes occur. Call your health care provider if you have any problems or questions after your procedure. What can I expect after the procedure? After the procedure, it is common to have:  Mild pain when you urinate. Pain should stop within a few minutes after you urinate. This may last for up to 1 week.  A small amount of blood in your urine for several days.  Feeling like you need to urinate but producing only a small amount of urine.  Follow these instructions at home:  Medicines  Take over-the-counter and prescription medicines only as told by your health care provider.  If you were prescribed an antibiotic medicine, take it as told by your health care provider. Do not stop taking the antibiotic even if you start to feel better. General instructions   Return to your normal activities as told by your health care provider. Ask your health care provider what activities are safe for you.  Do not drive for 24 hours if you received a sedative.  Watch for any blood in your urine. If the amount of blood in your urine increases, call your health care provider.  Follow instructions from your health care provider about eating or drinking restrictions.  If a tissue sample was removed for testing (biopsy) during your procedure, it is your responsibility to get your test results. Ask your health care provider or the department performing the test when your results will be ready.  Drink enough fluid to keep your urine clear or pale yellow.  Keep all follow-up visits as told by your health care provider. This is important. Contact a health care provider if:  You have pain that  gets worse or does not get better with medicine, especially pain when you urinate.  You have difficulty urinating. Get help right away if:  You have more blood in your urine.  You have blood clots in your urine.  You have abdominal pain.  You have a fever or chills.  You are unable to urinate. This information is not intended to replace advice given to you by your health care provider. Make sure you discuss any questions you have with your health care provider. Document Released: 08/09/2004 Document Revised: 06/28/2015 Document Reviewed: 12/07/2014 Elsevier Interactive Patient Education  2018 [employer] Anesthesia, Adult, Care After These instructions provide you with information about caring for yourself after your procedure. Your health care provider may also give you more specific instructions. Your treatment has been planned according to current medical practices, but problems sometimes occur. Call your health care provider if you have any problems or questions after your procedure. What can I expect after the procedure? After the procedure, it is common to have:  Vomiting.  A sore throat.  Mental slowness.  It is common to feel:  Nauseous.  Cold or shivery.  Sleepy.  Tired.  Sore or achy, even in parts of your body where you did not have surgery.  Follow these instructions at home: For at least 24 hours after the procedure:  Do not: ? Participate in activities where you could fall or become injured. ? Drive. ? Use heavy machinery. ? Drink alcohol. ? Take sleeping pills or medicines  that cause drowsiness. ? Make important decisions or sign legal documents. ? Take care of children on your own.  Rest. Eating and drinking  If you vomit, drink water, juice, or soup when you can drink without vomiting.  Drink enough fluid to keep your urine clear or pale yellow.  Make sure you have little or no nausea before eating solid foods.  Follow  the diet recommended by your health care provider. General instructions  Have a responsible adult stay with you until you are awake and alert.  Return to your normal activities as told by your health care provider. Ask your health care provider what activities are safe for you.  Take over-the-counter and prescription medicines only as told by your health care provider.  If you smoke, do not smoke without supervision.  Keep all follow-up visits as told by your health care provider. This is important. Contact a health care provider if:  You continue to have nausea or vomiting at home, and medicines are not helpful.  You cannot drink fluids or start eating again.  You cannot urinate after 8-12 hours.  You develop a skin rash.  You have fever.  You have increasing redness at the site of your procedure. Get help right away if:  You have difficulty breathing.  You have chest pain.  You have unexpected bleeding.  You feel that you are having a life-threatening or urgent problem. This information is not intended to replace advice given to you by your health care provider. Make sure you discuss any questions you have with your health care provider. Document Released: 04/28/2000 Document Revised: 06/25/2015 Document Reviewed: 01/04/2015 Elsevier Interactive Patient Education  [employer].

## 2017-09-23 NOTE — Op Note (Signed)
Preoperative diagnosis: gross hematuria Postoperative diagnosis: same  Procedure: 1 cystoscopy  Attending: Nicolette Bang  Anesthesia: MAC  Estimated blood loss: Minimal  Drains: none  Specimens: none Antibiotics: ancef  Findings:  Ureteral orifices in normal anatomic location. Right hutch diverticulum. Friable prostatic urethra. Multiple glomerulations in the bladder    Indications: Patient is a 52 year old male with a history of gross hematuria who did not tolerate office cystoscopy.  After discussing treatment options, they decided proceed with cystoscopy with possible bladder biopsy  Procedure her in detail: The patient was brought to the operating room and a brief timeout was done to ensure correct patient, correct procedure, correct site.  General anesthesia was administered patient was placed in dorsal lithotomy position.  Their genitalia was then prepped and draped in usual sterile fashion.  A rigid 52 French cystoscope was passed in the urethra and the bladder.  Bladder was inspected and we noted no masses or lesions.  the ureteral orifices were in the normal orthotopic locations. The bladder was then drained and this concluded the procedure which was well tolerated by patient.  Complications: None  Condition: Stable, extubated, transferred to PACU  Plan: Patient is to be discharge home. He will followup in 1 week

## 2017-09-23 NOTE — Transfer of Care (Signed)
Immediate Anesthesia Transfer of Care Note  Patient: Jack Irwin  Procedure(s) Performed: CYSTOSCOPY (N/A Penis)  Patient Location: PACU  Anesthesia Type:MAC  Level of Consciousness: awake and patient cooperative  Airway & Oxygen Therapy: Patient Spontanous Breathing and Patient connected to nasal cannula oxygen  Post-op Assessment: Report given to RN and Post -op Vital signs reviewed and stable  Post vital signs: Reviewed and stable  Last Vitals:  Vitals Value Taken Time  BP    Temp    Pulse    Resp    SpO2      Last Pain:  Vitals:   09/23/17 1137  TempSrc:   PainSc: 0-No pain      Patients Stated Pain Goal: 5 (64/15/83 0940)  Complications: No apparent anesthesia complications

## 2017-09-24 ENCOUNTER — Other Ambulatory Visit: Payer: Self-pay

## 2017-09-24 ENCOUNTER — Ambulatory Visit (INDEPENDENT_AMBULATORY_CARE_PROVIDER_SITE_OTHER): Payer: Medicare Other | Admitting: Family Medicine

## 2017-09-24 ENCOUNTER — Encounter: Payer: Self-pay | Admitting: Family Medicine

## 2017-09-24 VITALS — BP 145/98 | HR 82 | Temp 97.7°F | Resp 16 | Ht 71.0 in | Wt 266.0 lb

## 2017-09-24 DIAGNOSIS — E118 Type 2 diabetes mellitus with unspecified complications: Secondary | ICD-10-CM | POA: Diagnosis not present

## 2017-09-24 DIAGNOSIS — R110 Nausea: Secondary | ICD-10-CM

## 2017-09-24 DIAGNOSIS — Z7901 Long term (current) use of anticoagulants: Secondary | ICD-10-CM

## 2017-09-24 DIAGNOSIS — N186 End stage renal disease: Secondary | ICD-10-CM

## 2017-09-24 MED ORDER — PROMETHAZINE HCL 25 MG PO TABS: 25.0000 mg | ORAL_TABLET | Freq: Three times a day (TID) | ORAL | 0 refills | Status: DC | PRN

## 2017-09-24 MED ORDER — APIXABAN 2.5 MG PO TABS: 2.5000 mg | ORAL_TABLET | Freq: Two times a day (BID) | ORAL | 5 refills | Status: DC

## 2017-09-24 NOTE — Patient Instructions (Signed)
Nausea, Adult Feeling sick to your stomach (nausea) means that your stomach is upset or you feel like you have to throw up (vomit). Feeling sick to your stomach is usually not serious, but it may be an early sign of a more serious medical problem. As you feel sicker to your stomach, it can lead to throwing up (vomiting). If you throw up, or if you are not able to drink enough fluids, there is a risk of dehydration. Dehydration can make you feel tired and thirsty, have a dry mouth, and pee (urinate) less often. Older adults and people who have other diseases or a weak defense (immune) system have a higher risk of dehydration. The main goal of treating this condition is to:  Limit how often you feel sick to your stomach.  Prevent throwing up and dehydration.  Follow these instructions at home: Follow instructions from your doctor about how to care for yourself at home. Eating and drinking Follow these recommendations as told by your doctor:  Take an oral rehydration solution (ORS). This is a drink that is sold at pharmacies and stores.  Drink clear fluids in small amounts as you are able, such as: ? Water. ? Ice chips. ? Fruit juice that has water added (diluted fruit juice). ? Low-calorie sports drinks.  Eat bland, easy to digest foods in small amounts as you are able, such as: ? Bananas. ? Applesauce. ? Rice. ? Lean meats. ? Toast. ? Crackers.  Avoid drinking fluids that contain a lot of sugar or caffeine.  Avoid alcohol.  Avoid spicy or fatty foods.  General instructions  Drink enough fluid to keep your pee (urine) clear or pale yellow.  Wash your hands often. If you cannot use soap and water, use hand sanitizer.  Make sure that all people in your household wash their hands well and often.  Rest at home while you get better.  Take over-the-counter and prescription medicines only as told by your doctor.  Breathe slowly and deeply when you feel sick to your  stomach.  Watch your condition for any changes.  Keep all follow-up visits as told by your doctor. This is important. Contact a doctor if:  You have a headache.  You have new symptoms.  You feel sicker to your stomach.  You have a fever.  You feel light-headed or dizzy.  You throw up.  You are not able to keep fluids down. Get help right away if:  You have pain in your chest, neck, arm, or jaw.  You feel very weak or you pass out (faint).  You have throw up that is bright red or looks like coffee grounds.  You have bloody or black poop (stools), or poop that looks like tar.  You have a very bad headache, a stiff neck, or both.  You have very bad pain, cramping, or bloating in your belly.  You have a rash.  You have trouble breathing or you are breathing very quickly.  Your heart is beating very quickly.  Your skin feels cold and clammy.  You feel confused.  You have pain while peeing.  You have signs of dehydration, such as: ? Dark pee, or very little or no pee. ? Cracked lips. ? Dry mouth. ? Sunken eyes. ? Sleepiness. ? Weakness. These symptoms may be an emergency. Do not wait to see if the symptoms will go away. Get medical help right away. Call your local emergency services (911 in the [place] Do not drive yourself to   the hospital. This information is not intended to replace advice given to you by your health care provider. Make sure you discuss any questions you have with your health care provider. Document Released: 01/09/2011 Document Revised: 06/28/2015 Document Reviewed: 09/26/2014 Elsevier Interactive Patient Education  2018 [employer] Inc[employer]  

## 2017-09-24 NOTE — Progress Notes (Signed)
[facility]   Progress Note: General Provider: Lanae Boast, FNP  SUBJECTIVE:   Chief Complaint  Patient presents with  . Nausea  . Medication Refill    blood thinners     Patient presents for refill on Eloquis. Patient states that he had a cystoscope yesterday and has blood in his urine today. Patient also endorses nausea with taking pain medications. Denies chest pain, SOB, dizziness or leg swelling.  Patient is on dialysis 3 days per week. Lab work to be scanned. Patient would like referral to podiatry for diabetic exam and shoes.    Past Medical History:  Diagnosis Date  . Anemia   . Arthritis   . BPH (benign prostatic hyperplasia)   . Chronic back pain   . Difficulty walking   . DVT (deep venous thrombosis) ([place])    Right popliteal DVT December 2017  . End-stage renal disease on hemodialysis ([place])    Dr. Lowanda Foster  . Erectile dysfunction   . Essential hypertension   . Gout   . Neuropathy, diabetic ([place])   . Type 2 diabetes mellitus ([place])   . Venous (peripheral) insufficiency     Social History   Socioeconomic History  . Marital status: Married    Spouse name: Not on file  . Number of children: Not on file  . Years of education: Not on file  . Highest education level: Not on file  Occupational History  . Not on file  Social Needs  . Financial resource strain: Not on file  . Food insecurity:    Worry: Not on file    Inability: Not on file  . Transportation needs:    Medical: Not on file    Non-medical: Not on file  Tobacco Use  . Smoking status: Never Smoker  . Smokeless tobacco: Never Used  Substance and Sexual Activity  . Alcohol use: No    Alcohol/week: 0.0 standard drinks  . Drug use: No  . Sexual activity: Not on file  Lifestyle  . Physical activity:    Days per week: Not on file    Minutes per session: Not on file  . Stress: Not on file  Relationships  . Social connections:    Talks on  phone: Not on file    Gets together: Not on file    Attends religious service: Not on file    Active member of club or organization: Not on file    Attends meetings of clubs or organizations: Not on file    Relationship status: Not on file  . Intimate partner violence:    Fear of current or ex partner: Not on file    Emotionally abused: Not on file    Physically abused: Not on file    Forced sexual activity: Not on file  Other Topics Concern  . Not on file  Social History Narrative  . Not on file      Review of Systems  Constitutional: Negative.   Respiratory: Negative.   Cardiovascular: Negative.   Gastrointestinal: Positive for nausea. Negative for vomiting.  Genitourinary: Positive for hematuria.  Neurological: Negative.   Psychiatric/Behavioral: Negative.     OBJECTIVE: BP (!) 145/98 (BP Location: Left Arm, Patient Position: Sitting, Cuff Size: Large)   Pulse 82   Temp 97.7 F (36.5 C) (Oral)   Resp 16   Ht 5\' 11"  (1.803 m)   Wt 266 lb (120.7 kg)   SpO2 99%   BMI 37.10 kg/m  Physical Exam  Constitutional: He is oriented to person, place, and time. He appears well-developed and well-nourished.  HENT:  Head: Normocephalic and atraumatic.  Eyes: Pupils are equal, round, and reactive to light. Conjunctivae and EOM are normal.  Neck: Normal range of motion. Neck supple. No tracheal deviation present. No thyromegaly present.  Cardiovascular: Normal rate, regular rhythm, normal heart sounds and intact distal pulses. Exam reveals no friction rub.  No murmur heard. Pulmonary/Chest: Effort normal and breath sounds normal. No stridor. No respiratory distress. He has no wheezes.  Abdominal: Soft. Bowel sounds are normal. He exhibits no distension and no mass. There is no tenderness.  Lymphadenopathy:    He has no cervical adenopathy.  Neurological: He is alert and oriented to person, place, and time.  Skin: Skin is warm and dry.  Psychiatric: He has a normal mood and  affect. His behavior is normal. Judgment and thought content normal.  Nursing note and vitals reviewed.   ASSESSMENT/PLAN:  1. Chronic anticoagulation Refilled - apixaban (ELIQUIS) 2.5 MG TABS tablet; Take 1 tablet (2.5 mg total) by mouth 2 (two) times daily.  Dispense: 60 tablet; Refill: 5  2. Nausea Most likely due to anesthesia and pain medications.  - promethazine (PHENERGAN) 25 MG tablet; Take 1 tablet (25 mg total) by mouth every 8 (eight) hours as needed for nausea or vomiting.  Dispense: 20 tablet; Refill: 0  3. Type 2 diabetes mellitus with complication, unspecified whether long term insulin use (HCC) - Ambulatory referral to Podiatry - CBC With Differential; Future - Comprehensive metabolic panel; Future - TSH; Future - Lipid Panel With LDL/HDL Ratio; Future        The patient was given clear instructions to go to ER or return to medical center if symptoms do not improve, worsen or new problems develop. The patient verbalized understanding and agreed with plan of care.   Ms. Doug Sou. Nathaneil Canary, FNP-BC [facility] [address] [place], [place] [address] [phone]     This note has been created with Dragon speech recognition software and smart phrase technology. Any transcriptional errors are unintentional.

## 2017-09-25 DIAGNOSIS — N2581 Secondary hyperparathyroidism of renal origin: Secondary | ICD-10-CM | POA: Diagnosis not present

## 2017-09-25 DIAGNOSIS — Z992 Dependence on renal dialysis: Secondary | ICD-10-CM | POA: Diagnosis not present

## 2017-09-25 DIAGNOSIS — R112 Nausea with vomiting, unspecified: Secondary | ICD-10-CM | POA: Diagnosis not present

## 2017-09-25 DIAGNOSIS — D509 Iron deficiency anemia, unspecified: Secondary | ICD-10-CM | POA: Diagnosis not present

## 2017-09-25 DIAGNOSIS — N186 End stage renal disease: Secondary | ICD-10-CM | POA: Diagnosis not present

## 2017-09-28 DIAGNOSIS — D509 Iron deficiency anemia, unspecified: Secondary | ICD-10-CM | POA: Diagnosis not present

## 2017-09-28 DIAGNOSIS — R112 Nausea with vomiting, unspecified: Secondary | ICD-10-CM | POA: Diagnosis not present

## 2017-09-28 DIAGNOSIS — Z992 Dependence on renal dialysis: Secondary | ICD-10-CM | POA: Diagnosis not present

## 2017-09-28 DIAGNOSIS — N2581 Secondary hyperparathyroidism of renal origin: Secondary | ICD-10-CM | POA: Diagnosis not present

## 2017-09-28 DIAGNOSIS — N186 End stage renal disease: Secondary | ICD-10-CM | POA: Diagnosis not present

## 2017-09-30 ENCOUNTER — Ambulatory Visit (INDEPENDENT_AMBULATORY_CARE_PROVIDER_SITE_OTHER): Payer: Medicare Other | Admitting: Urology

## 2017-09-30 DIAGNOSIS — R310 Gross hematuria: Secondary | ICD-10-CM | POA: Diagnosis not present

## 2017-09-30 DIAGNOSIS — N186 End stage renal disease: Secondary | ICD-10-CM | POA: Diagnosis not present

## 2017-09-30 DIAGNOSIS — R112 Nausea with vomiting, unspecified: Secondary | ICD-10-CM | POA: Diagnosis not present

## 2017-09-30 DIAGNOSIS — D509 Iron deficiency anemia, unspecified: Secondary | ICD-10-CM | POA: Diagnosis not present

## 2017-09-30 DIAGNOSIS — Z992 Dependence on renal dialysis: Secondary | ICD-10-CM | POA: Diagnosis not present

## 2017-09-30 DIAGNOSIS — R102 Pelvic and perineal pain: Secondary | ICD-10-CM | POA: Diagnosis not present

## 2017-09-30 DIAGNOSIS — N2581 Secondary hyperparathyroidism of renal origin: Secondary | ICD-10-CM | POA: Diagnosis not present

## 2017-10-02 DIAGNOSIS — N2581 Secondary hyperparathyroidism of renal origin: Secondary | ICD-10-CM | POA: Diagnosis not present

## 2017-10-02 DIAGNOSIS — Z992 Dependence on renal dialysis: Secondary | ICD-10-CM | POA: Diagnosis not present

## 2017-10-02 DIAGNOSIS — N186 End stage renal disease: Secondary | ICD-10-CM | POA: Diagnosis not present

## 2017-10-02 DIAGNOSIS — D509 Iron deficiency anemia, unspecified: Secondary | ICD-10-CM | POA: Diagnosis not present

## 2017-10-02 DIAGNOSIS — R112 Nausea with vomiting, unspecified: Secondary | ICD-10-CM | POA: Diagnosis not present

## 2017-10-03 DIAGNOSIS — N186 End stage renal disease: Secondary | ICD-10-CM | POA: Diagnosis not present

## 2017-10-03 DIAGNOSIS — Z992 Dependence on renal dialysis: Secondary | ICD-10-CM | POA: Diagnosis not present

## 2017-10-05 DIAGNOSIS — R112 Nausea with vomiting, unspecified: Secondary | ICD-10-CM | POA: Diagnosis not present

## 2017-10-05 DIAGNOSIS — Z992 Dependence on renal dialysis: Secondary | ICD-10-CM | POA: Diagnosis not present

## 2017-10-05 DIAGNOSIS — N2581 Secondary hyperparathyroidism of renal origin: Secondary | ICD-10-CM | POA: Diagnosis not present

## 2017-10-05 DIAGNOSIS — N186 End stage renal disease: Secondary | ICD-10-CM | POA: Diagnosis not present

## 2017-10-05 DIAGNOSIS — D509 Iron deficiency anemia, unspecified: Secondary | ICD-10-CM | POA: Diagnosis not present

## 2017-10-09 DIAGNOSIS — R112 Nausea with vomiting, unspecified: Secondary | ICD-10-CM | POA: Diagnosis not present

## 2017-10-09 DIAGNOSIS — Z992 Dependence on renal dialysis: Secondary | ICD-10-CM | POA: Diagnosis not present

## 2017-10-09 DIAGNOSIS — D509 Iron deficiency anemia, unspecified: Secondary | ICD-10-CM | POA: Diagnosis not present

## 2017-10-09 DIAGNOSIS — N2581 Secondary hyperparathyroidism of renal origin: Secondary | ICD-10-CM | POA: Diagnosis not present

## 2017-10-09 DIAGNOSIS — N186 End stage renal disease: Secondary | ICD-10-CM | POA: Diagnosis not present

## 2017-10-12 DIAGNOSIS — N2581 Secondary hyperparathyroidism of renal origin: Secondary | ICD-10-CM | POA: Diagnosis not present

## 2017-10-12 DIAGNOSIS — Z992 Dependence on renal dialysis: Secondary | ICD-10-CM | POA: Diagnosis not present

## 2017-10-12 DIAGNOSIS — N186 End stage renal disease: Secondary | ICD-10-CM | POA: Diagnosis not present

## 2017-10-12 DIAGNOSIS — R112 Nausea with vomiting, unspecified: Secondary | ICD-10-CM | POA: Diagnosis not present

## 2017-10-12 DIAGNOSIS — D509 Iron deficiency anemia, unspecified: Secondary | ICD-10-CM | POA: Diagnosis not present

## 2017-10-14 DIAGNOSIS — N2581 Secondary hyperparathyroidism of renal origin: Secondary | ICD-10-CM | POA: Diagnosis not present

## 2017-10-14 DIAGNOSIS — N186 End stage renal disease: Secondary | ICD-10-CM | POA: Diagnosis not present

## 2017-10-14 DIAGNOSIS — R112 Nausea with vomiting, unspecified: Secondary | ICD-10-CM | POA: Diagnosis not present

## 2017-10-14 DIAGNOSIS — Z992 Dependence on renal dialysis: Secondary | ICD-10-CM | POA: Diagnosis not present

## 2017-10-14 DIAGNOSIS — D509 Iron deficiency anemia, unspecified: Secondary | ICD-10-CM | POA: Diagnosis not present

## 2017-10-16 DIAGNOSIS — N2581 Secondary hyperparathyroidism of renal origin: Secondary | ICD-10-CM | POA: Diagnosis not present

## 2017-10-16 DIAGNOSIS — Z992 Dependence on renal dialysis: Secondary | ICD-10-CM | POA: Diagnosis not present

## 2017-10-16 DIAGNOSIS — N186 End stage renal disease: Secondary | ICD-10-CM | POA: Diagnosis not present

## 2017-10-16 DIAGNOSIS — D509 Iron deficiency anemia, unspecified: Secondary | ICD-10-CM | POA: Diagnosis not present

## 2017-10-16 DIAGNOSIS — R112 Nausea with vomiting, unspecified: Secondary | ICD-10-CM | POA: Diagnosis not present

## 2017-10-19 DIAGNOSIS — D509 Iron deficiency anemia, unspecified: Secondary | ICD-10-CM | POA: Diagnosis not present

## 2017-10-19 DIAGNOSIS — N186 End stage renal disease: Secondary | ICD-10-CM | POA: Diagnosis not present

## 2017-10-19 DIAGNOSIS — N2581 Secondary hyperparathyroidism of renal origin: Secondary | ICD-10-CM | POA: Diagnosis not present

## 2017-10-19 DIAGNOSIS — R112 Nausea with vomiting, unspecified: Secondary | ICD-10-CM | POA: Diagnosis not present

## 2017-10-19 DIAGNOSIS — Z992 Dependence on renal dialysis: Secondary | ICD-10-CM | POA: Diagnosis not present

## 2017-10-21 DIAGNOSIS — N2581 Secondary hyperparathyroidism of renal origin: Secondary | ICD-10-CM | POA: Diagnosis not present

## 2017-10-21 DIAGNOSIS — N186 End stage renal disease: Secondary | ICD-10-CM | POA: Diagnosis not present

## 2017-10-21 DIAGNOSIS — D509 Iron deficiency anemia, unspecified: Secondary | ICD-10-CM | POA: Diagnosis not present

## 2017-10-21 DIAGNOSIS — Z992 Dependence on renal dialysis: Secondary | ICD-10-CM | POA: Diagnosis not present

## 2017-10-21 DIAGNOSIS — R112 Nausea with vomiting, unspecified: Secondary | ICD-10-CM | POA: Diagnosis not present

## 2017-10-22 ENCOUNTER — Ambulatory Visit: Payer: Self-pay | Admitting: Family Medicine

## 2017-10-23 DIAGNOSIS — D509 Iron deficiency anemia, unspecified: Secondary | ICD-10-CM | POA: Diagnosis not present

## 2017-10-23 DIAGNOSIS — Z992 Dependence on renal dialysis: Secondary | ICD-10-CM | POA: Diagnosis not present

## 2017-10-23 DIAGNOSIS — R112 Nausea with vomiting, unspecified: Secondary | ICD-10-CM | POA: Diagnosis not present

## 2017-10-23 DIAGNOSIS — N2581 Secondary hyperparathyroidism of renal origin: Secondary | ICD-10-CM | POA: Diagnosis not present

## 2017-10-23 DIAGNOSIS — N186 End stage renal disease: Secondary | ICD-10-CM | POA: Diagnosis not present

## 2017-10-26 DIAGNOSIS — R112 Nausea with vomiting, unspecified: Secondary | ICD-10-CM | POA: Diagnosis not present

## 2017-10-26 DIAGNOSIS — N186 End stage renal disease: Secondary | ICD-10-CM | POA: Diagnosis not present

## 2017-10-26 DIAGNOSIS — N2581 Secondary hyperparathyroidism of renal origin: Secondary | ICD-10-CM | POA: Diagnosis not present

## 2017-10-26 DIAGNOSIS — D509 Iron deficiency anemia, unspecified: Secondary | ICD-10-CM | POA: Diagnosis not present

## 2017-10-26 DIAGNOSIS — Z992 Dependence on renal dialysis: Secondary | ICD-10-CM | POA: Diagnosis not present

## 2017-10-28 ENCOUNTER — Other Ambulatory Visit (HOSPITAL_COMMUNITY): Payer: Self-pay | Admitting: Nephrology

## 2017-10-28 DIAGNOSIS — Z992 Dependence on renal dialysis: Principal | ICD-10-CM

## 2017-10-28 DIAGNOSIS — N186 End stage renal disease: Secondary | ICD-10-CM

## 2017-10-29 ENCOUNTER — Other Ambulatory Visit: Payer: Self-pay | Admitting: Radiology

## 2017-10-29 DIAGNOSIS — Z992 Dependence on renal dialysis: Secondary | ICD-10-CM | POA: Diagnosis not present

## 2017-10-29 DIAGNOSIS — N186 End stage renal disease: Secondary | ICD-10-CM | POA: Diagnosis not present

## 2017-10-29 DIAGNOSIS — T82868A Thrombosis of vascular prosthetic devices, implants and grafts, initial encounter: Secondary | ICD-10-CM | POA: Diagnosis not present

## 2017-10-30 ENCOUNTER — Inpatient Hospital Stay (HOSPITAL_COMMUNITY): Admission: RE | Admit: 2017-10-30 | Payer: Self-pay | Source: Ambulatory Visit

## 2017-10-30 DIAGNOSIS — N2581 Secondary hyperparathyroidism of renal origin: Secondary | ICD-10-CM | POA: Diagnosis not present

## 2017-10-30 DIAGNOSIS — Z992 Dependence on renal dialysis: Secondary | ICD-10-CM | POA: Diagnosis not present

## 2017-10-30 DIAGNOSIS — D509 Iron deficiency anemia, unspecified: Secondary | ICD-10-CM | POA: Diagnosis not present

## 2017-10-30 DIAGNOSIS — N186 End stage renal disease: Secondary | ICD-10-CM | POA: Diagnosis not present

## 2017-10-30 DIAGNOSIS — R112 Nausea with vomiting, unspecified: Secondary | ICD-10-CM | POA: Diagnosis not present

## 2017-11-02 DIAGNOSIS — N186 End stage renal disease: Secondary | ICD-10-CM | POA: Diagnosis not present

## 2017-11-02 DIAGNOSIS — D509 Iron deficiency anemia, unspecified: Secondary | ICD-10-CM | POA: Diagnosis not present

## 2017-11-02 DIAGNOSIS — N2581 Secondary hyperparathyroidism of renal origin: Secondary | ICD-10-CM | POA: Diagnosis not present

## 2017-11-02 DIAGNOSIS — Z992 Dependence on renal dialysis: Secondary | ICD-10-CM | POA: Diagnosis not present

## 2017-11-02 DIAGNOSIS — R112 Nausea with vomiting, unspecified: Secondary | ICD-10-CM | POA: Diagnosis not present

## 2017-11-04 ENCOUNTER — Ambulatory Visit (INDEPENDENT_AMBULATORY_CARE_PROVIDER_SITE_OTHER): Payer: Medicare Other | Admitting: Urology

## 2017-11-04 ENCOUNTER — Ambulatory Visit: Payer: Self-pay | Admitting: Family Medicine

## 2017-11-04 DIAGNOSIS — R102 Pelvic and perineal pain: Secondary | ICD-10-CM

## 2017-11-06 DIAGNOSIS — N2581 Secondary hyperparathyroidism of renal origin: Secondary | ICD-10-CM | POA: Diagnosis not present

## 2017-11-06 DIAGNOSIS — N186 End stage renal disease: Secondary | ICD-10-CM | POA: Diagnosis not present

## 2017-11-06 DIAGNOSIS — D509 Iron deficiency anemia, unspecified: Secondary | ICD-10-CM | POA: Diagnosis not present

## 2017-11-06 DIAGNOSIS — R112 Nausea with vomiting, unspecified: Secondary | ICD-10-CM | POA: Diagnosis not present

## 2017-11-06 DIAGNOSIS — Z23 Encounter for immunization: Secondary | ICD-10-CM | POA: Diagnosis not present

## 2017-11-06 DIAGNOSIS — Z992 Dependence on renal dialysis: Secondary | ICD-10-CM | POA: Diagnosis not present

## 2017-11-09 ENCOUNTER — Encounter (HOSPITAL_COMMUNITY): Payer: Self-pay | Admitting: Physical Therapy

## 2017-11-09 DIAGNOSIS — Z992 Dependence on renal dialysis: Secondary | ICD-10-CM | POA: Diagnosis not present

## 2017-11-09 DIAGNOSIS — D509 Iron deficiency anemia, unspecified: Secondary | ICD-10-CM | POA: Diagnosis not present

## 2017-11-09 DIAGNOSIS — R112 Nausea with vomiting, unspecified: Secondary | ICD-10-CM | POA: Diagnosis not present

## 2017-11-09 DIAGNOSIS — N2581 Secondary hyperparathyroidism of renal origin: Secondary | ICD-10-CM | POA: Diagnosis not present

## 2017-11-09 DIAGNOSIS — Z23 Encounter for immunization: Secondary | ICD-10-CM | POA: Diagnosis not present

## 2017-11-09 DIAGNOSIS — N186 End stage renal disease: Secondary | ICD-10-CM | POA: Diagnosis not present

## 2017-11-09 NOTE — Therapy (Signed)
[facility] [place], [place], [address] Phone: [phone]   Fax:  [phone]  Patient Details  Name: Jack Irwin MRN: [id number] Date of Birth: [birth year] Referring Provider:  No ref. provider found  Encounter Date: 11/09/2017   PHYSICAL THERAPY DISCHARGE SUMMARY  Visits from Start of Care:3  Current functional level related to goals / functional outcomes: PT stated he was feeling better no reassessment as pt canceled 3 treatments between 5/16 and 6/11; then cancelled 4 visits between 7/9 and 7/23.  Therapist called pt an informed that we were going to discharge and if he felt that he still needed treatment he would need to get a new referral.  Remaining deficits: unknown   Education / Equipment: HEP Plan: Patient agrees to discharge.  Patient goals were not met. Patient is being discharged due to not returning since the last visit.  ?????     Rayetta Humphrey, PT CLT [phone] 11/09/2017, 4:19 PM  [facility] [address] [place], [place], [address] Phone: [phone]   Fax:  [phone]

## 2017-11-11 ENCOUNTER — Ambulatory Visit (INDEPENDENT_AMBULATORY_CARE_PROVIDER_SITE_OTHER): Payer: Medicare Other | Admitting: Urology

## 2017-11-11 DIAGNOSIS — R102 Pelvic and perineal pain: Secondary | ICD-10-CM | POA: Diagnosis not present

## 2017-11-12 ENCOUNTER — Other Ambulatory Visit: Payer: Self-pay

## 2017-11-12 DIAGNOSIS — N529 Male erectile dysfunction, unspecified: Secondary | ICD-10-CM

## 2017-11-12 MED ORDER — SILDENAFIL CITRATE 20 MG PO TABS: 20.0000 mg | ORAL_TABLET | Freq: Every day | ORAL | 5 refills | Status: DC | PRN

## 2017-11-13 DIAGNOSIS — D509 Iron deficiency anemia, unspecified: Secondary | ICD-10-CM | POA: Diagnosis not present

## 2017-11-13 DIAGNOSIS — R112 Nausea with vomiting, unspecified: Secondary | ICD-10-CM | POA: Diagnosis not present

## 2017-11-13 DIAGNOSIS — Z992 Dependence on renal dialysis: Secondary | ICD-10-CM | POA: Diagnosis not present

## 2017-11-13 DIAGNOSIS — N186 End stage renal disease: Secondary | ICD-10-CM | POA: Diagnosis not present

## 2017-11-13 DIAGNOSIS — N2581 Secondary hyperparathyroidism of renal origin: Secondary | ICD-10-CM | POA: Diagnosis not present

## 2017-11-13 DIAGNOSIS — Z23 Encounter for immunization: Secondary | ICD-10-CM | POA: Diagnosis not present

## 2017-11-13 IMAGING — US US RENAL
1 series · 13 of 25 positions shown · non-contrast
Comparison: MRI abdomen dated 06/10/2016.

CLINICAL DATA: Renal neoplasm, kidney stone follow-up.

EXAM:
RENAL / URINARY TRACT ULTRASOUND COMPLETE

[Series 1: us renal · 0.30mm/px · 13 of 53 slices shown]
[im 1/53]
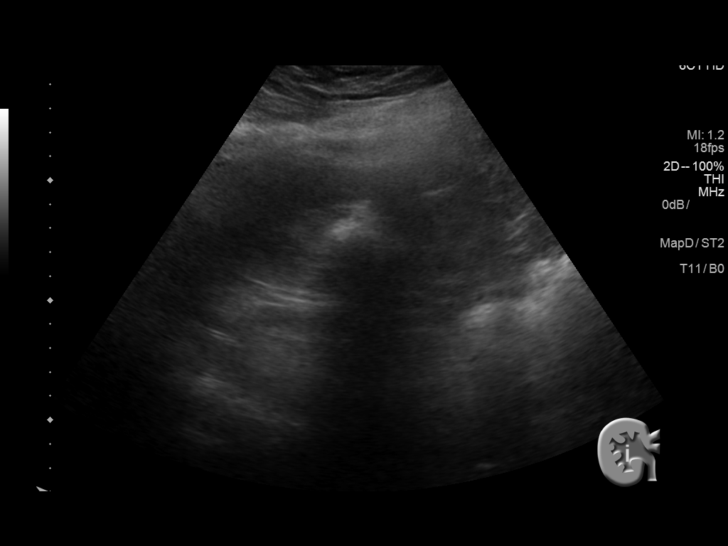
[im 5/53]
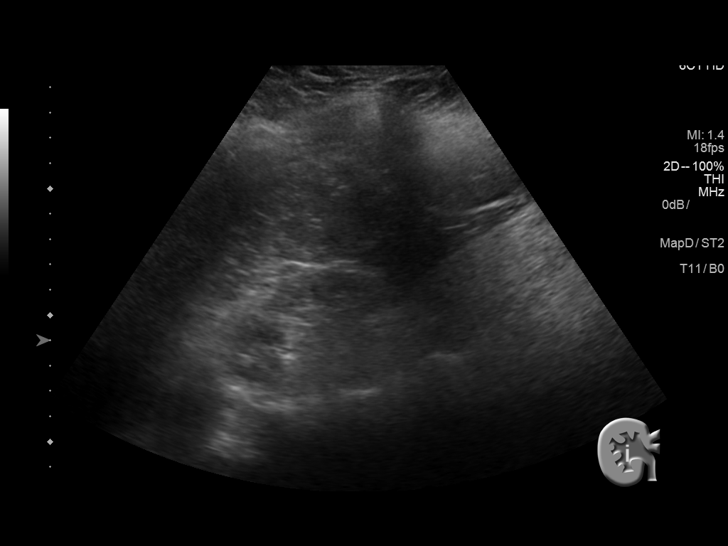
[im 9/53]
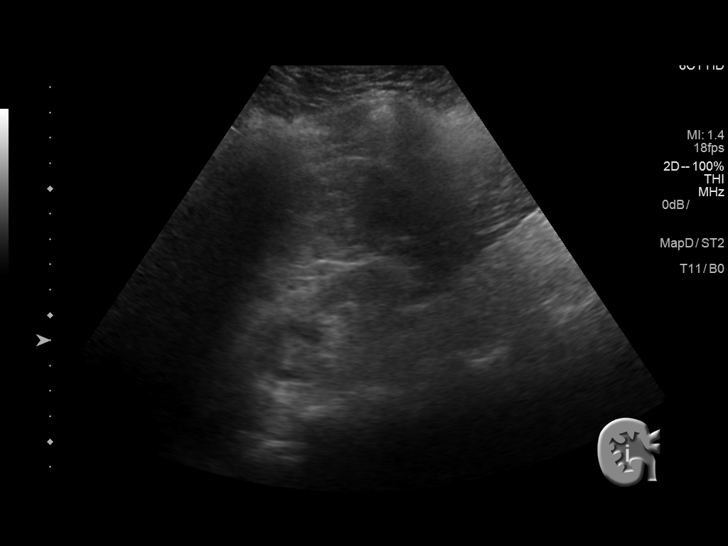
[im 14/53]
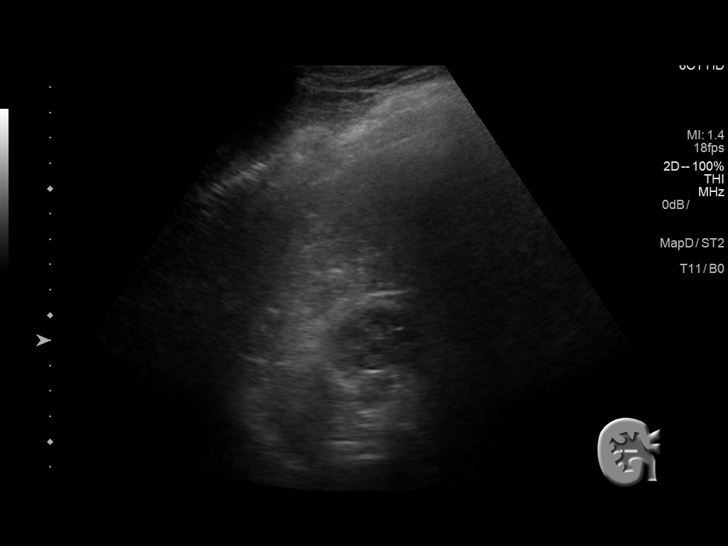
[im 18/53]
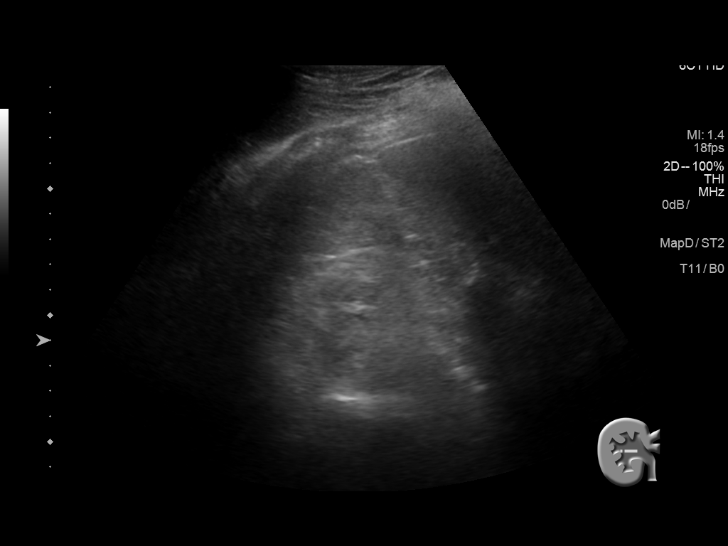
[im 22/53]
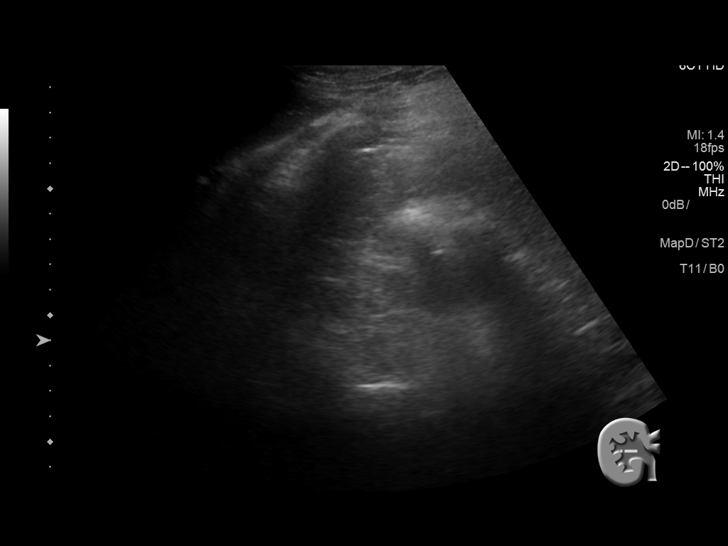
[im 27/53]
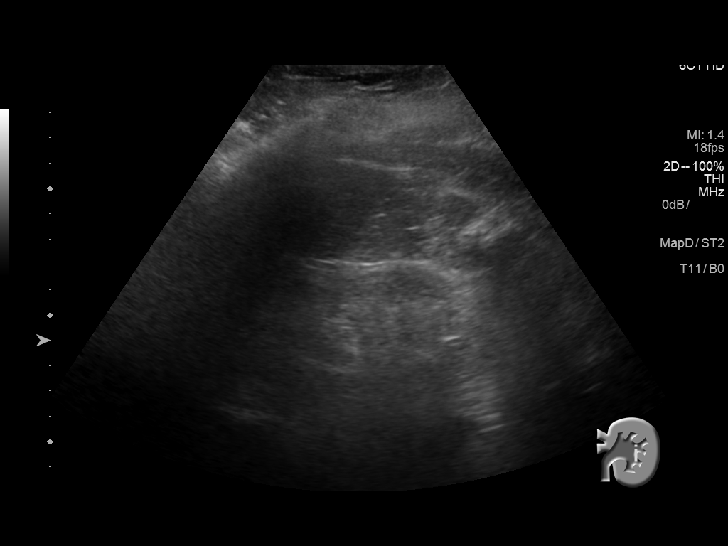
[im 31/53]
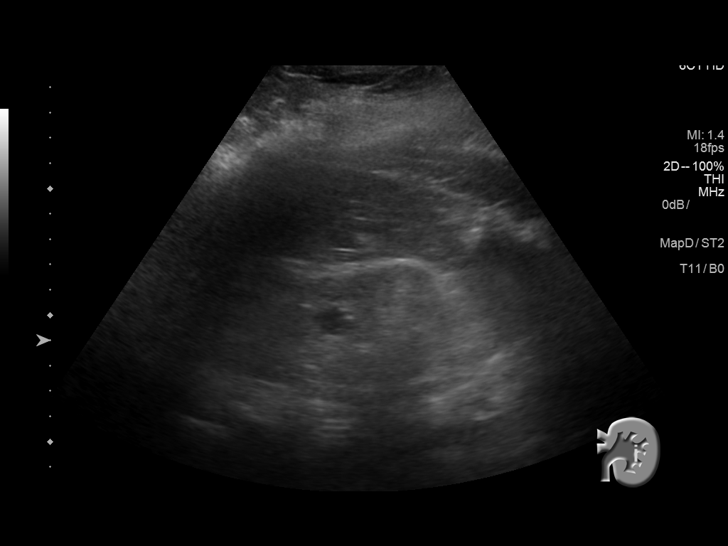
[im 35/53]
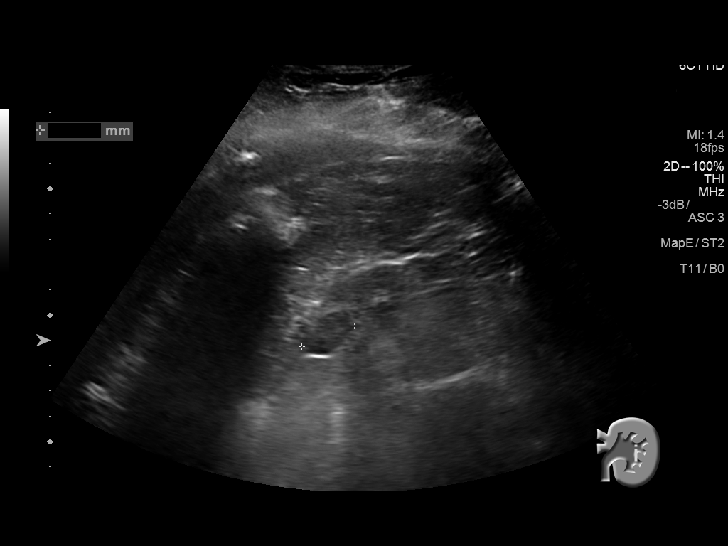
[im 40/53]
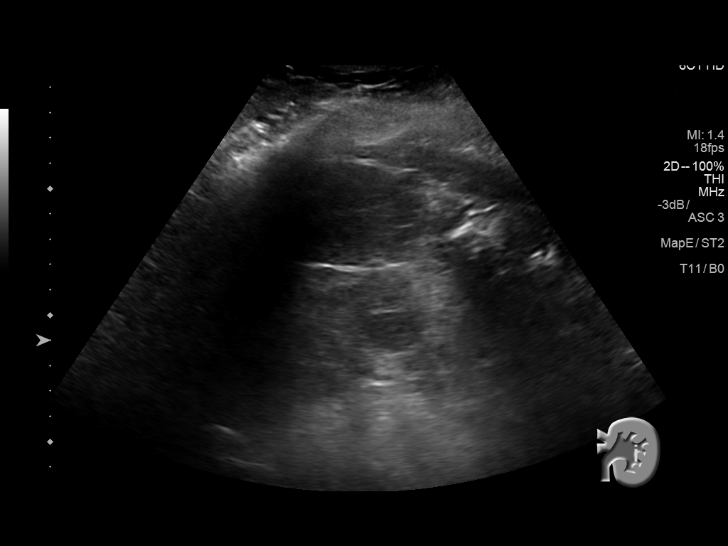
[im 44/53]
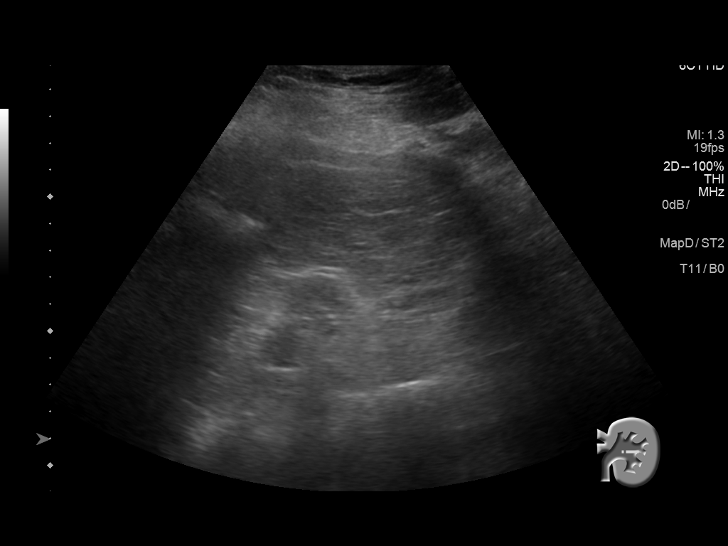
[im 48/53]
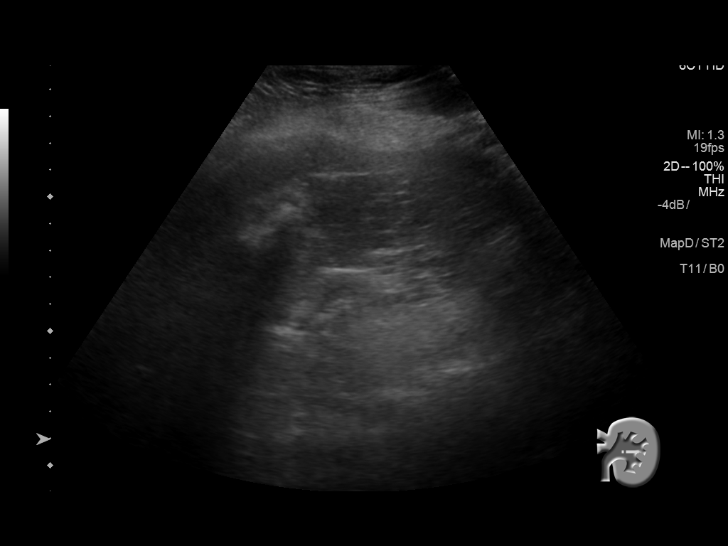
[im 53/53]
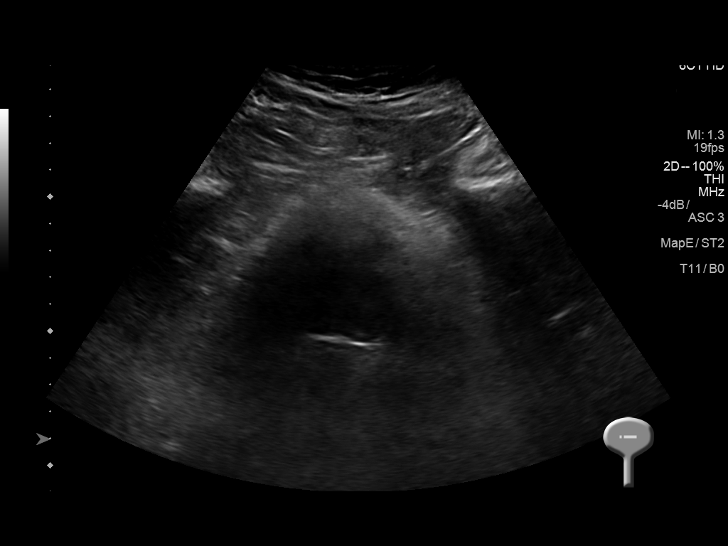

[13 of 25 positions shown; findings below may reference images not displayed]

FINDINGS: Right Kidney:

Length: 8.6 cm. Thin renal cortex throughout. Hypoechoic mass within
the upper pole measures 4 x 3.2 x 3.9 cm, compatible with the 3.5 cm
complex cystic lesion described on previous MRI. No additional mass
or lesion identified. No hydronephrosis.

Left Kidney:

Length: 7.4 cm. Thin renal cortex throughout. Hypoechoic mass within
the midpole region measures 2.4 x 1.6 x 2.3 cm, correlate for the
1.9 cm complex cystic mass described on earlier MRI. No additional
mass or lesion identified. No hydronephrosis.

Bladder:

Decompressed limiting characterization.
IMPRESSION: 1. Both kidneys are atrophic with thin renal cortices throughout,
compatible with the bilateral renal parenchymal atrophy described on
previous MRI of 06/10/2016.
[DATE]. Hypoechoic masses within each kidney, corresponding to the
complex cystic lesions described within each kidney on previous MRI.
No additional masses or lesions are identified today. As indicated
on the previous MRI report, per consensus guidelines, follow-up MRI
recommended 6 months from date of earlier MRI for more definitive
characterization of stability.
3. No acute findings.  No hydronephrosis.

## 2017-11-16 DIAGNOSIS — Z992 Dependence on renal dialysis: Secondary | ICD-10-CM | POA: Diagnosis not present

## 2017-11-16 DIAGNOSIS — N186 End stage renal disease: Secondary | ICD-10-CM | POA: Diagnosis not present

## 2017-11-16 DIAGNOSIS — N2581 Secondary hyperparathyroidism of renal origin: Secondary | ICD-10-CM | POA: Diagnosis not present

## 2017-11-16 DIAGNOSIS — Z23 Encounter for immunization: Secondary | ICD-10-CM | POA: Diagnosis not present

## 2017-11-16 DIAGNOSIS — R112 Nausea with vomiting, unspecified: Secondary | ICD-10-CM | POA: Diagnosis not present

## 2017-11-16 DIAGNOSIS — E119 Type 2 diabetes mellitus without complications: Secondary | ICD-10-CM | POA: Diagnosis not present

## 2017-11-16 DIAGNOSIS — D509 Iron deficiency anemia, unspecified: Secondary | ICD-10-CM | POA: Diagnosis not present

## 2017-11-18 DIAGNOSIS — N2581 Secondary hyperparathyroidism of renal origin: Secondary | ICD-10-CM | POA: Diagnosis not present

## 2017-11-18 DIAGNOSIS — D509 Iron deficiency anemia, unspecified: Secondary | ICD-10-CM | POA: Diagnosis not present

## 2017-11-18 DIAGNOSIS — Z23 Encounter for immunization: Secondary | ICD-10-CM | POA: Diagnosis not present

## 2017-11-18 DIAGNOSIS — Z992 Dependence on renal dialysis: Secondary | ICD-10-CM | POA: Diagnosis not present

## 2017-11-18 DIAGNOSIS — R112 Nausea with vomiting, unspecified: Secondary | ICD-10-CM | POA: Diagnosis not present

## 2017-11-18 DIAGNOSIS — N186 End stage renal disease: Secondary | ICD-10-CM | POA: Diagnosis not present

## 2017-11-19 ENCOUNTER — Ambulatory Visit (INDEPENDENT_AMBULATORY_CARE_PROVIDER_SITE_OTHER)
Admission: RE | Admit: 2017-11-19 | Discharge: 2017-11-19 | Disposition: A | Discharge status: Home / Self Care | Payer: Medicare Other | Source: Ambulatory Visit | Attending: Vascular Surgery | Admitting: Vascular Surgery

## 2017-11-19 ENCOUNTER — Ambulatory Visit (INDEPENDENT_AMBULATORY_CARE_PROVIDER_SITE_OTHER): Payer: Medicare Other | Admitting: Vascular Surgery

## 2017-11-19 ENCOUNTER — Encounter: Payer: Self-pay | Admitting: Vascular Surgery

## 2017-11-19 ENCOUNTER — Ambulatory Visit (HOSPITAL_COMMUNITY)
Admission: RE | Admit: 2017-11-19 | Discharge: 2017-11-19 | Disposition: A | Discharge status: Home / Self Care | Payer: Medicare Other | Source: Ambulatory Visit | Attending: Vascular Surgery | Admitting: Vascular Surgery

## 2017-11-19 ENCOUNTER — Other Ambulatory Visit: Payer: Self-pay | Admitting: *Deleted

## 2017-11-19 ENCOUNTER — Encounter: Payer: Self-pay | Admitting: *Deleted

## 2017-11-19 VITALS — BP 102/58 | HR 105 | Temp 96.9°F | Resp 16 | Ht 71.0 in | Wt 263.0 lb

## 2017-11-19 DIAGNOSIS — Z992 Dependence on renal dialysis: Secondary | ICD-10-CM | POA: Diagnosis not present

## 2017-11-19 DIAGNOSIS — N186 End stage renal disease: Secondary | ICD-10-CM | POA: Diagnosis not present

## 2017-11-19 NOTE — H&P (View-Only) (Signed)
Patient is a 52 year old male sent for evaluation for placement of a new hemodialysis access.  He has previously had a failed fistula on the left arm.  He now has an occluded left upper arm graft.  He also has an occluded right upper arm AV graft.  He is currently dialyzing via right sided catheter.  He is on chronic anticoagulation for prior DVT.  View of systems: He denies chest pain.  He denies shortness of breath.  Past Surgical History:  Procedure Laterality Date  . AV FISTULA PLACEMENT Left 03/09/2014   Procedure: INSERTION OF ARTERIOVENOUS (AV) GORE-TEX GRAFT ARM;  Surgeon: Angelia Mould, MD;  Location: [facility] OR;  Service: Vascular;  Laterality: Left;  . AV FISTULA PLACEMENT Right 05/06/2016   Procedure: INSERTION OF ARTERIOVENOUS (AV) GORE-TEX GRAFT  Right ARM;  Surgeon: Elam Dutch, MD;  Location: [place];  Service: Vascular;  Laterality: Right;  . CYST EXCISION Right    cyst removed on thumb 2001  . CYSTOSCOPY N/A 09/23/2017   Procedure: CYSTOSCOPY;  Surgeon: Cleon Gustin, MD;  Location: [facility];  Service: Urology;  Laterality: N/A;  . INSERTION OF DIALYSIS CATHETER    . INSERTION OF DIALYSIS CATHETER Left 03/09/2014   Procedure: INSERTION OF DIALYSIS CATHETER;  Surgeon: Angelia Mould, MD;  Location: [place];  Service: Vascular;  Laterality: Left;  . IR THROMBECTOMY AV FISTULA W/THROMBOLYSIS/PTA INC/SHUNT/IMG RIGHT Right 11/01/2016  . IR US GUIDE VASC ACCESS RIGHT  11/01/2016  . IRRIGATION AND DEBRIDEMENT ABSCESS N/A 02/25/2016   Procedure: IRRIGATION AND DEBRIDEMENT SCROTAL ABSCESS;  Surgeon: Nickie Retort, MD;  Location: [facility];  Service: Urology;  Laterality: N/A;     Past Medical History:  Diagnosis Date  . Anemia   . Arthritis   . BPH (benign prostatic hyperplasia)   . Chronic back pain   . Difficulty walking   . DVT (deep venous thrombosis) ([place])    Right popliteal DVT December 2017  . End-stage renal disease on hemodialysis ([place])    Dr. Lowanda Foster  .  Erectile dysfunction   . Essential hypertension   . Gout   . Neuropathy, diabetic ([place])   . Type 2 diabetes mellitus ([place])   . Venous (peripheral) insufficiency    Current Outpatient Medications on File Prior to Visit  Medication Sig Dispense Refill  . acetaminophen (TYLENOL) 500 MG tablet Take 500 mg by mouth every 6 (six) hours as needed for mild pain.    [name] apixaban (ELIQUIS) 2.5 MG TABS tablet Take 1 tablet (2.5 mg total) by mouth 2 (two) times daily. 60 tablet 5  . colchicine 0.6 MG tablet Take 0.5 tablets (0.3 mg total) by mouth daily as needed (for gout). 30 tablet 3  . finasteride (PROSCAR) 5 MG tablet     . gabapentin (NEURONTIN) 100 MG capsule Take 1 capsule (100 mg total) by mouth daily. 30 capsule 3  . promethazine (PHENERGAN) 25 MG tablet Take 1 tablet (25 mg total) by mouth every 8 (eight) hours as needed for nausea or vomiting. 20 tablet 0  . sevelamer carbonate (RENVELA) 800 MG tablet Take 1 tablet (800 mg total) by mouth 2 (two) times daily between meals as needed (With snacks). 60 tablet 5  . sildenafil (REVATIO) 20 MG tablet Take 1 tablet (20 mg total) by mouth daily as needed. Dissolved under tongue (TROCHE) 60 minutes prior to sexual encounter 10 tablet 5  . traMADol (ULTRAM) 50 MG tablet Take 1 tablet (50 mg total) by mouth  every 6 (six) hours as needed. 15 tablet 0  . ULORIC 40 MG tablet Take 1 tablet (40 mg total) by mouth daily. 30 tablet 5   No current facility-administered medications on file prior to visit.     Allergies  Allergen Reactions  . Doxycycline Itching    Social History   Socioeconomic History  . Marital status: Married    Spouse name: Not on file  . Number of children: Not on file  . Years of education: Not on file  . Highest education level: Not on file  Occupational History  . Not on file  Social Needs  . Financial resource strain: Not on file  . Food insecurity:    Worry: Not on file    Inability: Not on file  . Transportation  needs:    Medical: Not on file    Non-medical: Not on file  Tobacco Use  . Smoking status: Never Smoker  . Smokeless tobacco: Never Used  Substance and Sexual Activity  . Alcohol use: No    Alcohol/week: 0.0 standard drinks  . Drug use: No  . Sexual activity: Not on file  Lifestyle  . Physical activity:    Days per week: Not on file    Minutes per session: Not on file  . Stress: Not on file  Relationships  . Social connections:    Talks on phone: Not on file    Gets together: Not on file    Attends religious service: Not on file    Active member of club or organization: Not on file    Attends meetings of clubs or organizations: Not on file    Relationship status: Not on file  . Intimate partner violence:    Fear of current or ex partner: Not on file    Emotionally abused: Not on file    Physically abused: Not on file    Forced sexual activity: Not on file  Other Topics Concern  . Not on file  Social History Narrative  . Not on file    Physical exam:  Vitals:   11/19/17 1552  BP: (!) 102/58  Pulse: (!) 105  Resp: 16  Temp: (!) 96.9 F (36.1 C)  TempSrc: Oral  SpO2: 95%  Weight: 263 lb (119.3 kg)  Height: 5\' 11"  (1.803 m)   Extremities: Occluded grafts left and right upper arm 2+ brachial radial pulse bilaterally  Chest: Clear to auscultation bilaterally, right side dialysis catheter  Neuro: Symmetric upper extremity lower extreme motor strength 5/5 no facial asymmetry  Data: Patient had a duplex ultrasound of his right upper extremity today which showed a 3 mm cephalic vein fairly deep in the arm and adjacent to his prior AV graft he also had a 3 mm basilic vein.  The left cephalic vein is thrombosed.  The left basilic vein was smaller about 2 to 3 mm.  He also had an arterial duplex exam which showed a high brachial bifurcation in the upper arm  The bifurcation appeared below the antecubital fossa  Assessment: Patient has a high brachial bifurcation on the  right side.  However, he may have a reasonable cephalic vein for creation of an upper arm fistula.  Hopefully he has adequate arterial inflow to create this.  Plan: We will attempt to place a right brachiocephalic AV fistula on December 15, 2017.  We will stop his Eliquis 24 hours before the procedure.  Risk benefits possible complications of procedure details including but not limited to bleeding  infection fistula thrombosis non-maturation ischemic steal were all discussed with patient today.  He understands and agrees to proceed.  Ruta Hinds, MD [facility] Office: [phone] Pager: [phone]

## 2017-11-19 NOTE — Progress Notes (Signed)
Patient is a 52 year old male sent for evaluation for placement of a new hemodialysis access.  He has previously had a failed fistula on the left arm.  He now has an occluded left upper arm graft.  He also has an occluded right upper arm AV graft.  He is currently dialyzing via right sided catheter.  He is on chronic anticoagulation for prior DVT.  View of systems: He denies chest pain.  He denies shortness of breath.  Past Surgical History:  Procedure Laterality Date  . AV FISTULA PLACEMENT Left 03/09/2014   Procedure: INSERTION OF ARTERIOVENOUS (AV) GORE-TEX GRAFT ARM;  Surgeon: Angelia Mould, MD;  Location: [facility] OR;  Service: Vascular;  Laterality: Left;  . AV FISTULA PLACEMENT Right 05/06/2016   Procedure: INSERTION OF ARTERIOVENOUS (AV) GORE-TEX GRAFT  Right ARM;  Surgeon: Elam Dutch, MD;  Location: [place];  Service: Vascular;  Laterality: Right;  . CYST EXCISION Right    cyst removed on thumb 2001  . CYSTOSCOPY N/A 09/23/2017   Procedure: CYSTOSCOPY;  Surgeon: Cleon Gustin, MD;  Location: [facility];  Service: Urology;  Laterality: N/A;  . INSERTION OF DIALYSIS CATHETER    . INSERTION OF DIALYSIS CATHETER Left 03/09/2014   Procedure: INSERTION OF DIALYSIS CATHETER;  Surgeon: Angelia Mould, MD;  Location: [place];  Service: Vascular;  Laterality: Left;  . IR THROMBECTOMY AV FISTULA W/THROMBOLYSIS/PTA INC/SHUNT/IMG RIGHT Right 11/01/2016  . IR US GUIDE VASC ACCESS RIGHT  11/01/2016  . IRRIGATION AND DEBRIDEMENT ABSCESS N/A 02/25/2016   Procedure: IRRIGATION AND DEBRIDEMENT SCROTAL ABSCESS;  Surgeon: Nickie Retort, MD;  Location: [facility];  Service: Urology;  Laterality: N/A;     Past Medical History:  Diagnosis Date  . Anemia   . Arthritis   . BPH (benign prostatic hyperplasia)   . Chronic back pain   . Difficulty walking   . DVT (deep venous thrombosis) ([place])    Right popliteal DVT December 2017  . End-stage renal disease on hemodialysis ([place])    Dr. Lowanda Foster  .  Erectile dysfunction   . Essential hypertension   . Gout   . Neuropathy, diabetic ([place])   . Type 2 diabetes mellitus ([place])   . Venous (peripheral) insufficiency    Current Outpatient Medications on File Prior to Visit  Medication Sig Dispense Refill  . acetaminophen (TYLENOL) 500 MG tablet Take 500 mg by mouth every 6 (six) hours as needed for mild pain.    [name] apixaban (ELIQUIS) 2.5 MG TABS tablet Take 1 tablet (2.5 mg total) by mouth 2 (two) times daily. 60 tablet 5  . colchicine 0.6 MG tablet Take 0.5 tablets (0.3 mg total) by mouth daily as needed (for gout). 30 tablet 3  . finasteride (PROSCAR) 5 MG tablet     . gabapentin (NEURONTIN) 100 MG capsule Take 1 capsule (100 mg total) by mouth daily. 30 capsule 3  . promethazine (PHENERGAN) 25 MG tablet Take 1 tablet (25 mg total) by mouth every 8 (eight) hours as needed for nausea or vomiting. 20 tablet 0  . sevelamer carbonate (RENVELA) 800 MG tablet Take 1 tablet (800 mg total) by mouth 2 (two) times daily between meals as needed (With snacks). 60 tablet 5  . sildenafil (REVATIO) 20 MG tablet Take 1 tablet (20 mg total) by mouth daily as needed. Dissolved under tongue (TROCHE) 60 minutes prior to sexual encounter 10 tablet 5  . traMADol (ULTRAM) 50 MG tablet Take 1 tablet (50 mg total) by mouth  every 6 (six) hours as needed. 15 tablet 0  . ULORIC 40 MG tablet Take 1 tablet (40 mg total) by mouth daily. 30 tablet 5   No current facility-administered medications on file prior to visit.     Allergies  Allergen Reactions  . Doxycycline Itching    Social History   Socioeconomic History  . Marital status: Married    Spouse name: Not on file  . Number of children: Not on file  . Years of education: Not on file  . Highest education level: Not on file  Occupational History  . Not on file  Social Needs  . Financial resource strain: Not on file  . Food insecurity:    Worry: Not on file    Inability: Not on file  . Transportation  needs:    Medical: Not on file    Non-medical: Not on file  Tobacco Use  . Smoking status: Never Smoker  . Smokeless tobacco: Never Used  Substance and Sexual Activity  . Alcohol use: No    Alcohol/week: 0.0 standard drinks  . Drug use: No  . Sexual activity: Not on file  Lifestyle  . Physical activity:    Days per week: Not on file    Minutes per session: Not on file  . Stress: Not on file  Relationships  . Social connections:    Talks on phone: Not on file    Gets together: Not on file    Attends religious service: Not on file    Active member of club or organization: Not on file    Attends meetings of clubs or organizations: Not on file    Relationship status: Not on file  . Intimate partner violence:    Fear of current or ex partner: Not on file    Emotionally abused: Not on file    Physically abused: Not on file    Forced sexual activity: Not on file  Other Topics Concern  . Not on file  Social History Narrative  . Not on file    Physical exam:  Vitals:   11/19/17 1552  BP: (!) 102/58  Pulse: (!) 105  Resp: 16  Temp: (!) 96.9 F (36.1 C)  TempSrc: Oral  SpO2: 95%  Weight: 263 lb (119.3 kg)  Height: 5\' 11"  (1.803 m)   Extremities: Occluded grafts left and right upper arm 2+ brachial radial pulse bilaterally  Chest: Clear to auscultation bilaterally, right side dialysis catheter  Neuro: Symmetric upper extremity lower extreme motor strength 5/5 no facial asymmetry  Data: Patient had a duplex ultrasound of his right upper extremity today which showed a 3 mm cephalic vein fairly deep in the arm and adjacent to his prior AV graft he also had a 3 mm basilic vein.  The left cephalic vein is thrombosed.  The left basilic vein was smaller about 2 to 3 mm.  He also had an arterial duplex exam which showed a high brachial bifurcation in the upper arm  The bifurcation appeared below the antecubital fossa  Assessment: Patient has a high brachial bifurcation on the  right side.  However, he may have a reasonable cephalic vein for creation of an upper arm fistula.  Hopefully he has adequate arterial inflow to create this.  Plan: We will attempt to place a right brachiocephalic AV fistula on December 15, 2017.  We will stop his Eliquis 24 hours before the procedure.  Risk benefits possible complications of procedure details including but not limited to bleeding  infection fistula thrombosis non-maturation ischemic steal were all discussed with patient today.  He understands and agrees to proceed.  Ruta Hinds, MD [facility] [place] Office: [phone] Pager: [phone]

## 2017-11-20 DIAGNOSIS — Z23 Encounter for immunization: Secondary | ICD-10-CM | POA: Diagnosis not present

## 2017-11-20 DIAGNOSIS — N186 End stage renal disease: Secondary | ICD-10-CM | POA: Diagnosis not present

## 2017-11-20 DIAGNOSIS — N2581 Secondary hyperparathyroidism of renal origin: Secondary | ICD-10-CM | POA: Diagnosis not present

## 2017-11-20 DIAGNOSIS — D509 Iron deficiency anemia, unspecified: Secondary | ICD-10-CM | POA: Diagnosis not present

## 2017-11-20 DIAGNOSIS — R112 Nausea with vomiting, unspecified: Secondary | ICD-10-CM | POA: Diagnosis not present

## 2017-11-20 DIAGNOSIS — Z992 Dependence on renal dialysis: Secondary | ICD-10-CM | POA: Diagnosis not present

## 2017-11-23 DIAGNOSIS — N2581 Secondary hyperparathyroidism of renal origin: Secondary | ICD-10-CM | POA: Diagnosis not present

## 2017-11-23 DIAGNOSIS — Z992 Dependence on renal dialysis: Secondary | ICD-10-CM | POA: Diagnosis not present

## 2017-11-23 DIAGNOSIS — N186 End stage renal disease: Secondary | ICD-10-CM | POA: Diagnosis not present

## 2017-11-23 DIAGNOSIS — D509 Iron deficiency anemia, unspecified: Secondary | ICD-10-CM | POA: Diagnosis not present

## 2017-11-23 DIAGNOSIS — Z23 Encounter for immunization: Secondary | ICD-10-CM | POA: Diagnosis not present

## 2017-11-23 DIAGNOSIS — R112 Nausea with vomiting, unspecified: Secondary | ICD-10-CM | POA: Diagnosis not present

## 2017-11-25 DIAGNOSIS — Z23 Encounter for immunization: Secondary | ICD-10-CM | POA: Diagnosis not present

## 2017-11-25 DIAGNOSIS — Z992 Dependence on renal dialysis: Secondary | ICD-10-CM | POA: Diagnosis not present

## 2017-11-25 DIAGNOSIS — N186 End stage renal disease: Secondary | ICD-10-CM | POA: Diagnosis not present

## 2017-11-25 DIAGNOSIS — N2581 Secondary hyperparathyroidism of renal origin: Secondary | ICD-10-CM | POA: Diagnosis not present

## 2017-11-25 DIAGNOSIS — D509 Iron deficiency anemia, unspecified: Secondary | ICD-10-CM | POA: Diagnosis not present

## 2017-11-25 DIAGNOSIS — R112 Nausea with vomiting, unspecified: Secondary | ICD-10-CM | POA: Diagnosis not present

## 2017-11-26 ENCOUNTER — Ambulatory Visit: Payer: Medicare Other | Admitting: Podiatry

## 2017-11-27 ENCOUNTER — Ambulatory Visit: Payer: Self-pay | Admitting: Family Medicine

## 2017-11-27 DIAGNOSIS — N2581 Secondary hyperparathyroidism of renal origin: Secondary | ICD-10-CM | POA: Diagnosis not present

## 2017-11-27 DIAGNOSIS — Z23 Encounter for immunization: Secondary | ICD-10-CM | POA: Diagnosis not present

## 2017-11-27 DIAGNOSIS — R112 Nausea with vomiting, unspecified: Secondary | ICD-10-CM | POA: Diagnosis not present

## 2017-11-27 DIAGNOSIS — D509 Iron deficiency anemia, unspecified: Secondary | ICD-10-CM | POA: Diagnosis not present

## 2017-11-27 DIAGNOSIS — N186 End stage renal disease: Secondary | ICD-10-CM | POA: Diagnosis not present

## 2017-11-27 DIAGNOSIS — Z992 Dependence on renal dialysis: Secondary | ICD-10-CM | POA: Diagnosis not present

## 2017-11-30 DIAGNOSIS — N2581 Secondary hyperparathyroidism of renal origin: Secondary | ICD-10-CM | POA: Diagnosis not present

## 2017-11-30 DIAGNOSIS — R112 Nausea with vomiting, unspecified: Secondary | ICD-10-CM | POA: Diagnosis not present

## 2017-11-30 DIAGNOSIS — D509 Iron deficiency anemia, unspecified: Secondary | ICD-10-CM | POA: Diagnosis not present

## 2017-11-30 DIAGNOSIS — N186 End stage renal disease: Secondary | ICD-10-CM | POA: Diagnosis not present

## 2017-11-30 DIAGNOSIS — Z23 Encounter for immunization: Secondary | ICD-10-CM | POA: Diagnosis not present

## 2017-11-30 DIAGNOSIS — Z992 Dependence on renal dialysis: Secondary | ICD-10-CM | POA: Diagnosis not present

## 2017-12-03 DIAGNOSIS — N186 End stage renal disease: Secondary | ICD-10-CM | POA: Diagnosis not present

## 2017-12-03 DIAGNOSIS — Z992 Dependence on renal dialysis: Secondary | ICD-10-CM | POA: Diagnosis not present

## 2017-12-03 IMAGING — US US EXTREM LOW VENOUS BILAT
1 series · 13 of 24 positions shown · non-contrast
Comparison: None.

CLINICAL DATA: Bilateral lower extremity pain.  Evaluate for DVT.



[Series 1: us extrem low venous bilat · 0.07mm/px · 13 of 55 slices shown]
[im 1/55]
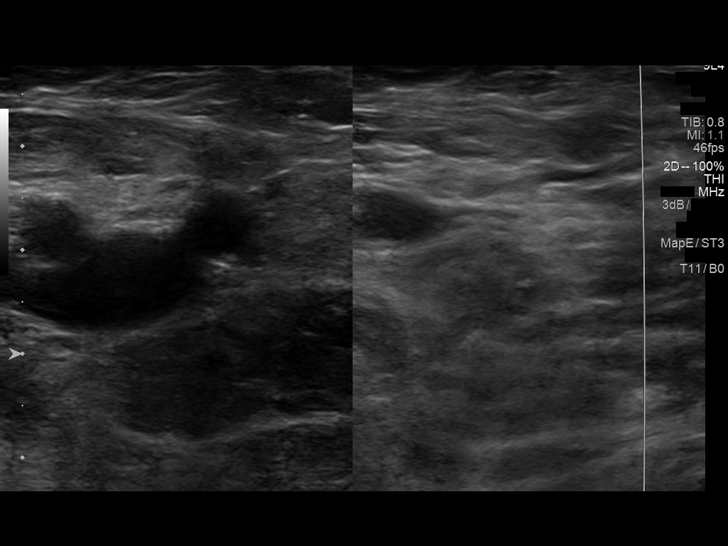
[im 5/55]
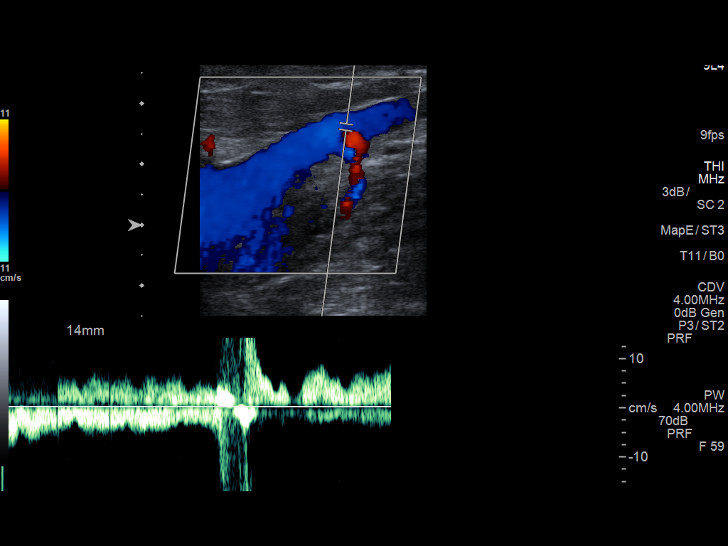
[im 10/55]
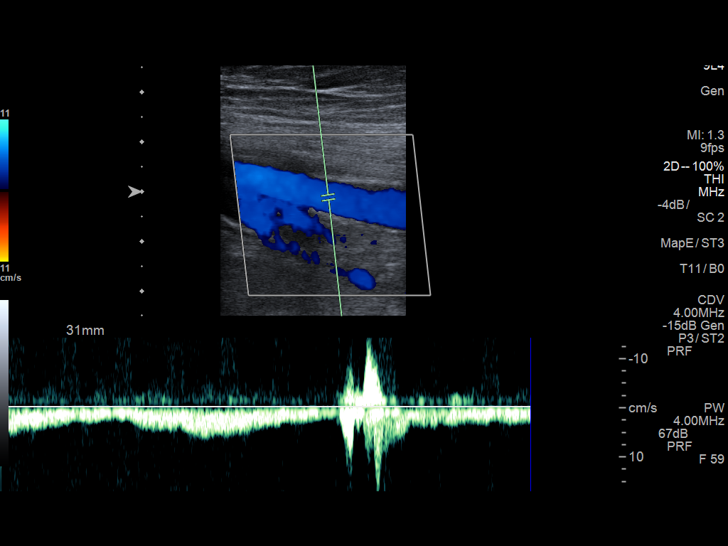
[im 15/55]
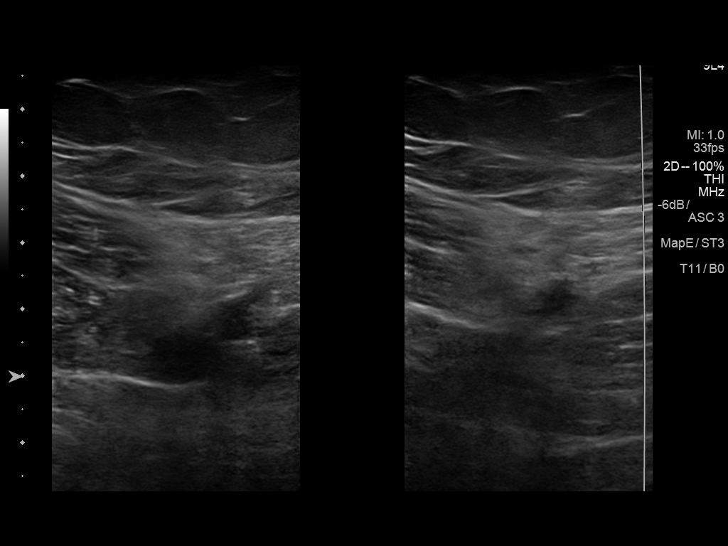
[im 19/55]
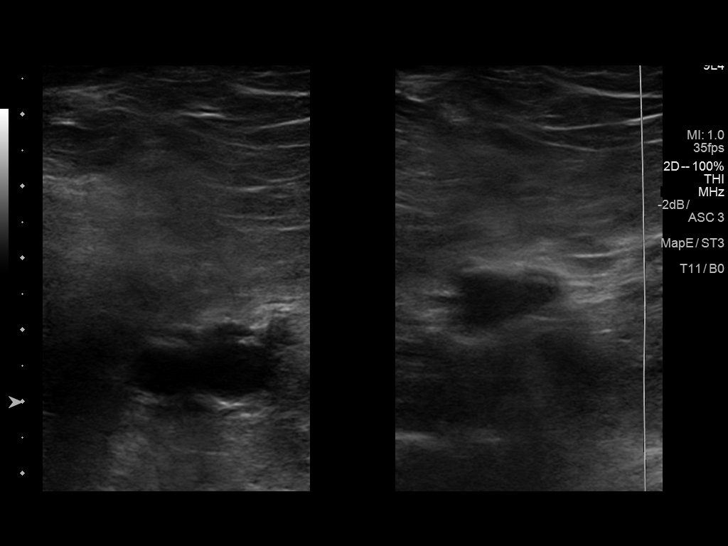
[im 24/55]
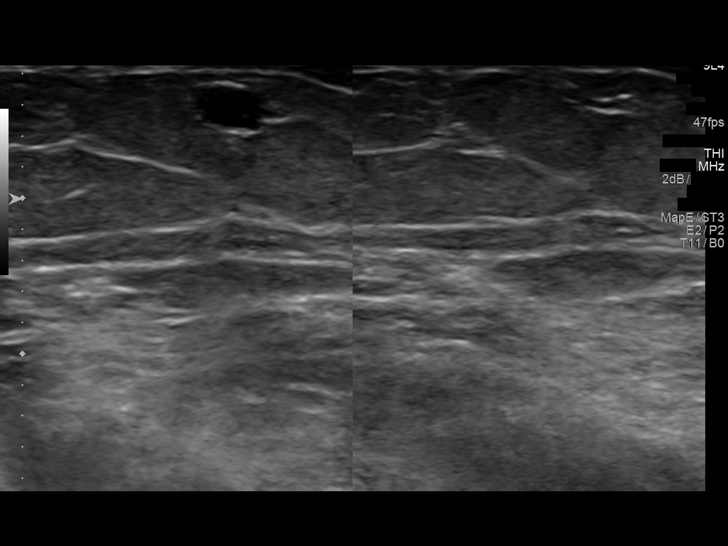
[im 29/55]
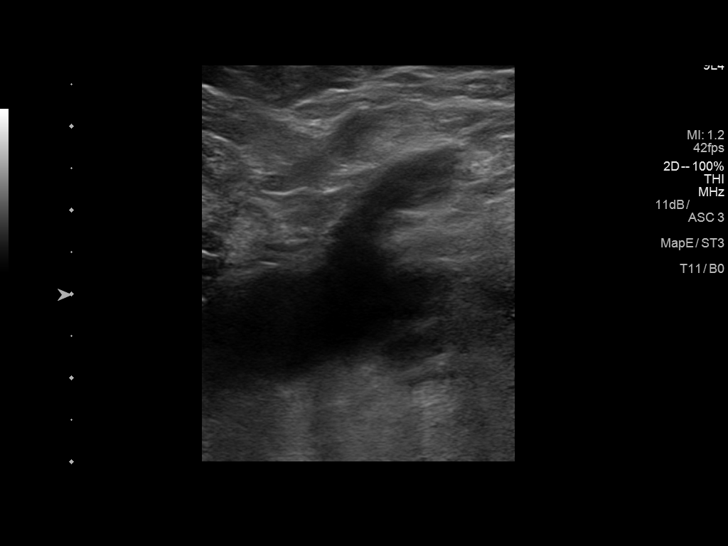
[im 31/55]
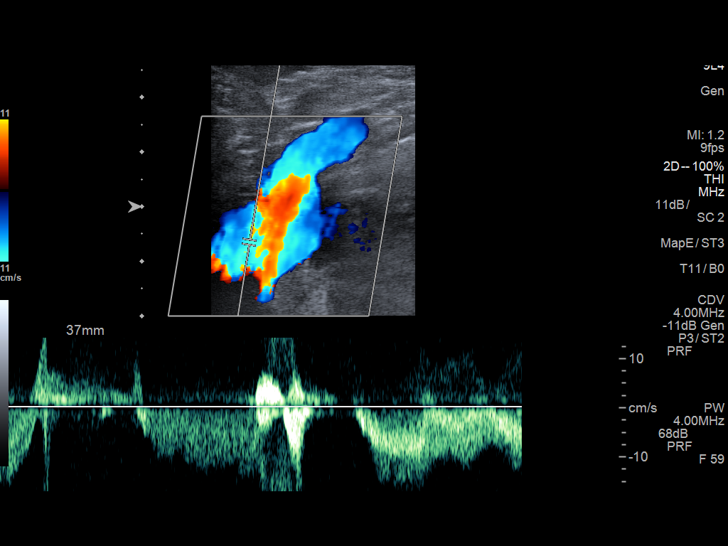
[im 36/55]
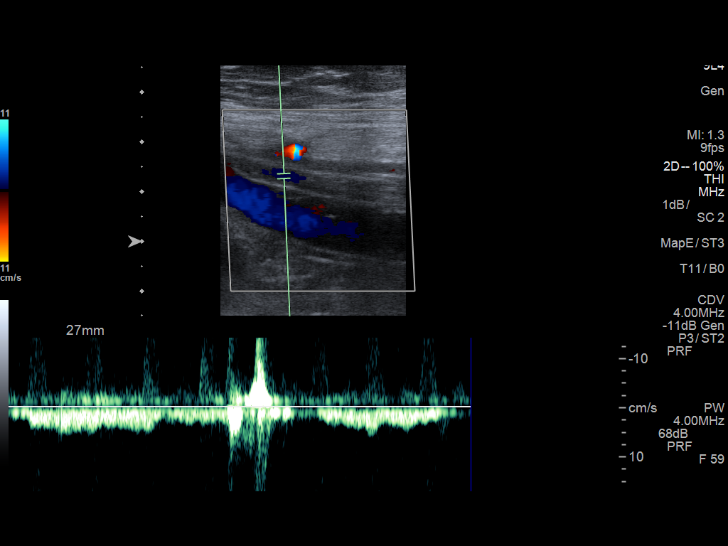
[im 40/55]
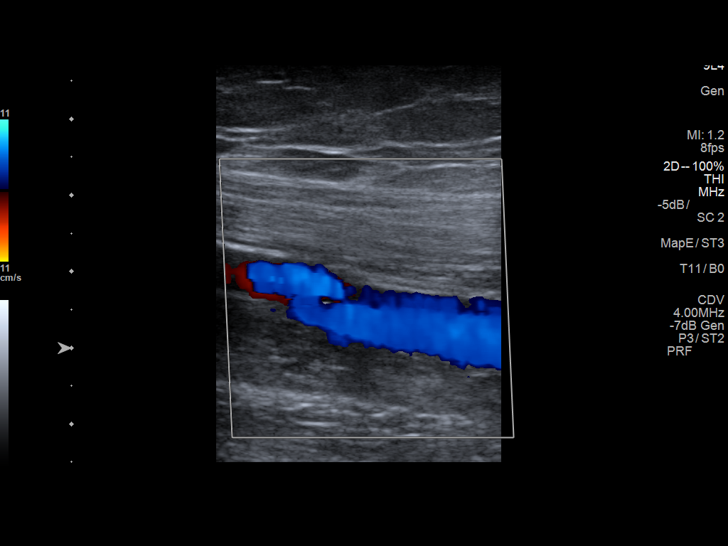
[im 45/55]
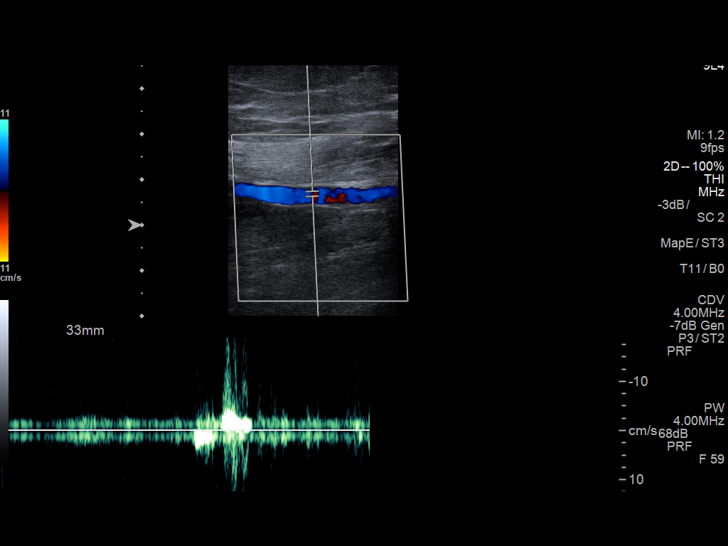
[im 50/55]
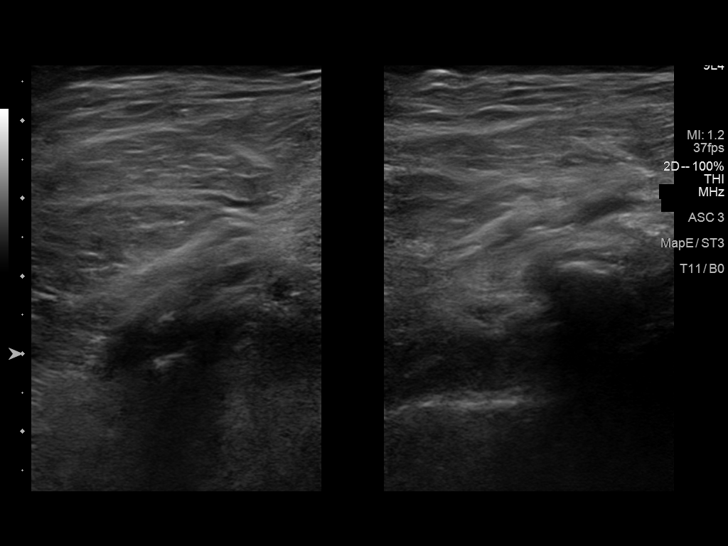
[im 55/55]
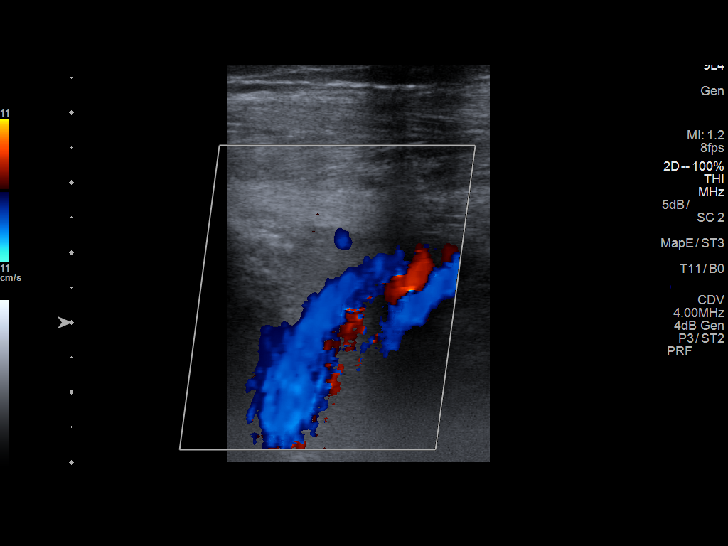

[13 of 24 positions shown; findings below may reference images not displayed]

FINDINGS: RIGHT LOWER EXTREMITY

Common Femoral Vein: No evidence of thrombus. Normal
compressibility, respiratory phasicity and response to augmentation.

Saphenofemoral Junction: No evidence of thrombus. Normal
compressibility and flow on color Doppler imaging.

Profunda Femoral Vein: No evidence of thrombus. Normal
compressibility and flow on color Doppler imaging.

Femoral Vein: No evidence of thrombus. Normal compressibility,
respiratory phasicity and response to augmentation.

Popliteal Vein: No evidence of thrombus. Normal compressibility,
respiratory phasicity and response to augmentation.

Calf Veins: No evidence of thrombus. Normal compressibility and flow
on color Doppler imaging.

Superficial Great Saphenous Vein: No evidence of thrombus. Normal
compressibility.

Venous Reflux:  None.

Other Findings:  None.

LEFT LOWER EXTREMITY

Common Femoral Vein: No evidence of thrombus. Normal
compressibility, respiratory phasicity and response to augmentation.

Saphenofemoral Junction: No evidence of thrombus. Normal
compressibility and flow on color Doppler imaging.

Profunda Femoral Vein: No evidence of thrombus. Normal
compressibility and flow on color Doppler imaging.

Femoral Vein: No evidence of thrombus. Normal compressibility,
respiratory phasicity and response to augmentation.

Popliteal Vein: No evidence of thrombus. Normal compressibility,
respiratory phasicity and response to augmentation.

Calf Veins: No evidence of thrombus. Normal compressibility and flow
on color Doppler imaging.

Superficial Great Saphenous Vein: No evidence of thrombus. Normal
compressibility.

Venous Reflux:  None.

Other Findings:  None.
IMPRESSION: No evidence of DVT within either lower extremity.

## 2017-12-04 DIAGNOSIS — D509 Iron deficiency anemia, unspecified: Secondary | ICD-10-CM | POA: Diagnosis not present

## 2017-12-04 DIAGNOSIS — N2581 Secondary hyperparathyroidism of renal origin: Secondary | ICD-10-CM | POA: Diagnosis not present

## 2017-12-04 DIAGNOSIS — R112 Nausea with vomiting, unspecified: Secondary | ICD-10-CM | POA: Diagnosis not present

## 2017-12-04 DIAGNOSIS — Z992 Dependence on renal dialysis: Secondary | ICD-10-CM | POA: Diagnosis not present

## 2017-12-04 DIAGNOSIS — N186 End stage renal disease: Secondary | ICD-10-CM | POA: Diagnosis not present

## 2017-12-07 DIAGNOSIS — D509 Iron deficiency anemia, unspecified: Secondary | ICD-10-CM | POA: Diagnosis not present

## 2017-12-07 DIAGNOSIS — Z992 Dependence on renal dialysis: Secondary | ICD-10-CM | POA: Diagnosis not present

## 2017-12-07 DIAGNOSIS — N2581 Secondary hyperparathyroidism of renal origin: Secondary | ICD-10-CM | POA: Diagnosis not present

## 2017-12-07 DIAGNOSIS — N186 End stage renal disease: Secondary | ICD-10-CM | POA: Diagnosis not present

## 2017-12-07 DIAGNOSIS — R112 Nausea with vomiting, unspecified: Secondary | ICD-10-CM | POA: Diagnosis not present

## 2017-12-08 ENCOUNTER — Other Ambulatory Visit: Payer: Self-pay | Admitting: Urology

## 2017-12-08 DIAGNOSIS — R310 Gross hematuria: Secondary | ICD-10-CM

## 2017-12-09 DIAGNOSIS — R112 Nausea with vomiting, unspecified: Secondary | ICD-10-CM | POA: Diagnosis not present

## 2017-12-09 DIAGNOSIS — Z992 Dependence on renal dialysis: Secondary | ICD-10-CM | POA: Diagnosis not present

## 2017-12-09 DIAGNOSIS — N186 End stage renal disease: Secondary | ICD-10-CM | POA: Diagnosis not present

## 2017-12-09 DIAGNOSIS — D509 Iron deficiency anemia, unspecified: Secondary | ICD-10-CM | POA: Diagnosis not present

## 2017-12-09 DIAGNOSIS — N2581 Secondary hyperparathyroidism of renal origin: Secondary | ICD-10-CM | POA: Diagnosis not present

## 2017-12-11 ENCOUNTER — Ambulatory Visit: Payer: Medicare Other | Admitting: Podiatry

## 2017-12-11 ENCOUNTER — Encounter (HOSPITAL_COMMUNITY): Payer: Self-pay | Admitting: *Deleted

## 2017-12-11 ENCOUNTER — Other Ambulatory Visit: Payer: Self-pay

## 2017-12-11 DIAGNOSIS — N186 End stage renal disease: Secondary | ICD-10-CM | POA: Diagnosis not present

## 2017-12-11 DIAGNOSIS — N2581 Secondary hyperparathyroidism of renal origin: Secondary | ICD-10-CM | POA: Diagnosis not present

## 2017-12-11 DIAGNOSIS — Z992 Dependence on renal dialysis: Secondary | ICD-10-CM | POA: Diagnosis not present

## 2017-12-11 DIAGNOSIS — D509 Iron deficiency anemia, unspecified: Secondary | ICD-10-CM | POA: Diagnosis not present

## 2017-12-11 DIAGNOSIS — R112 Nausea with vomiting, unspecified: Secondary | ICD-10-CM | POA: Diagnosis not present

## 2017-12-11 NOTE — Progress Notes (Signed)
Spoke with pt for pre-op call. Pt denies cardiac history. Pt is a type 2 diabetic, no longer on medications. Pt's last A1C in Epic was 5.1 on 03/12/17. Pt states he does not have a CBG meter.

## 2017-12-12 IMAGING — MR MR ABDOMEN W/O CM
6 of 9 series · 23 of 48 positions shown · non-contrast
Comparison: None.

CLINICAL DATA: Followup complex cystic lesions of both kidneys.
Renal insufficiency.

EXAM:
MRI ABDOMEN WITHOUT CONTRAST
TECHNIQUE: Multiplanar multisequence MR imaging was performed without the
administration of intravenous contrast.

[Series 4: cor ssfse nav · coronal · 6.0mm · 0.78mm/px · 3 of 37 slices shown]
[im 1/37]
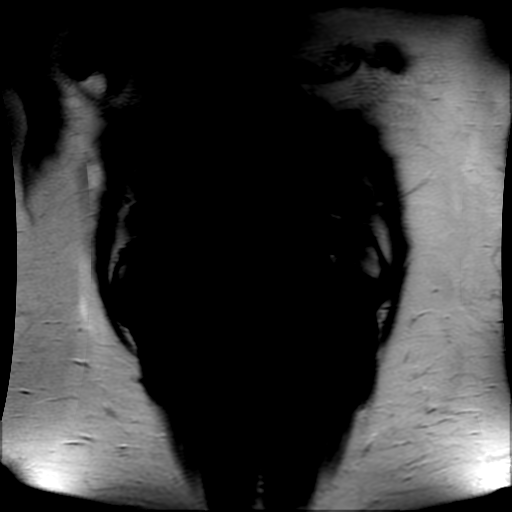
[im 19/37]
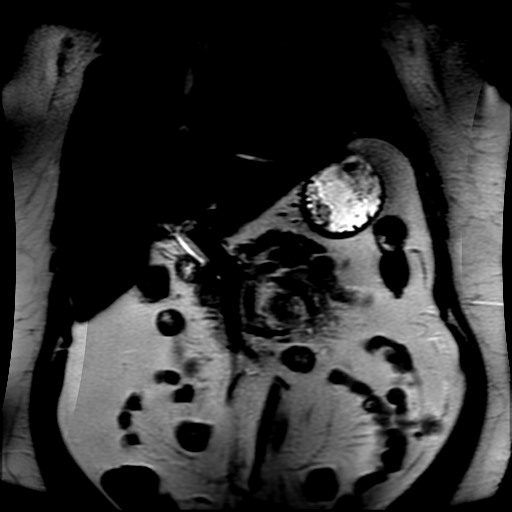
[im 37/37]
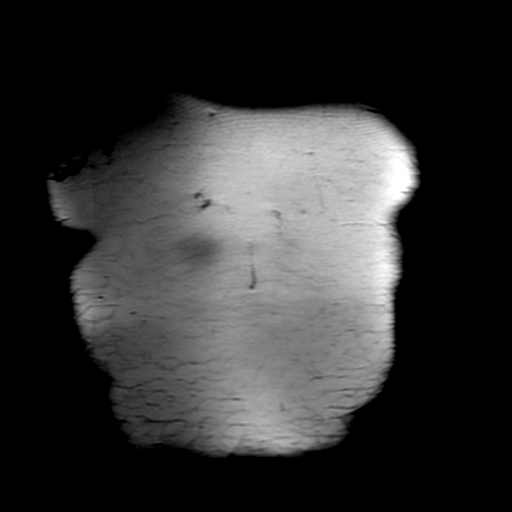

[Series 6: ax ssfse nav · axial · 6.0mm · 0.74mm/px · z∈[+13,+223]mm · 3 of 36 slices shown]
[im 1/36]
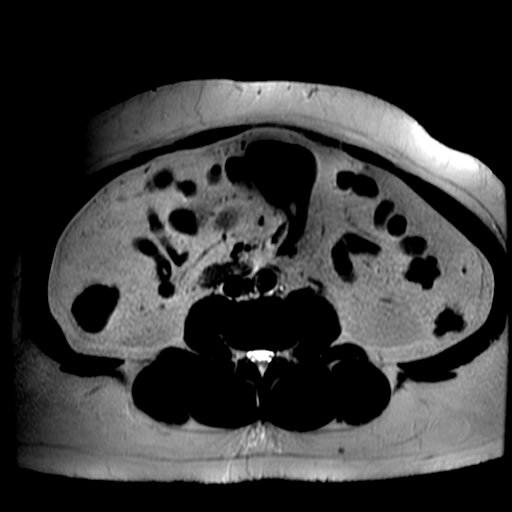
[im 18/36]
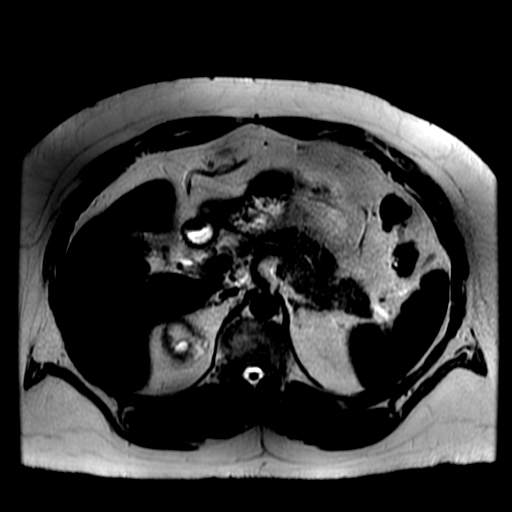
[im 36/36]
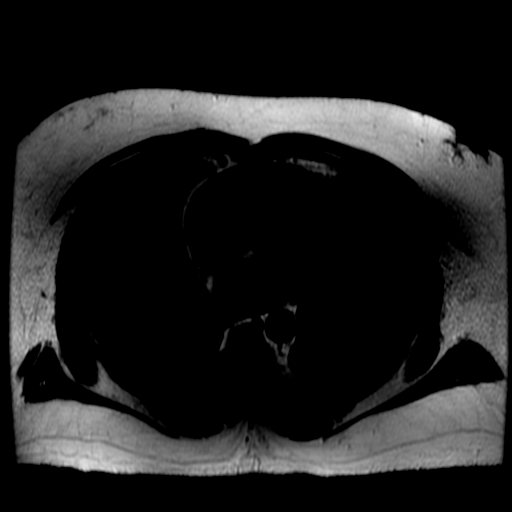

[Series 7: T2 fat-sat · axial · 6.0mm · 0.74mm/px · z∈[+13,+223]mm · 4 of 36 slices shown]
[im 1/36]
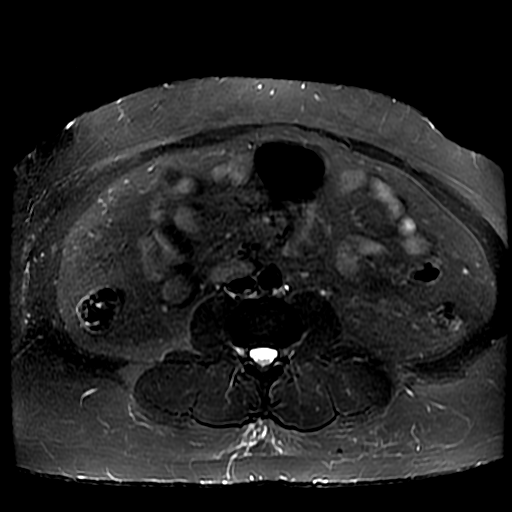
[im 12/36]
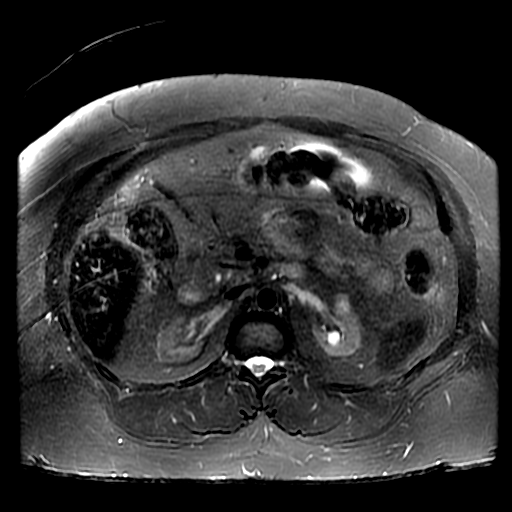
[im 24/36]
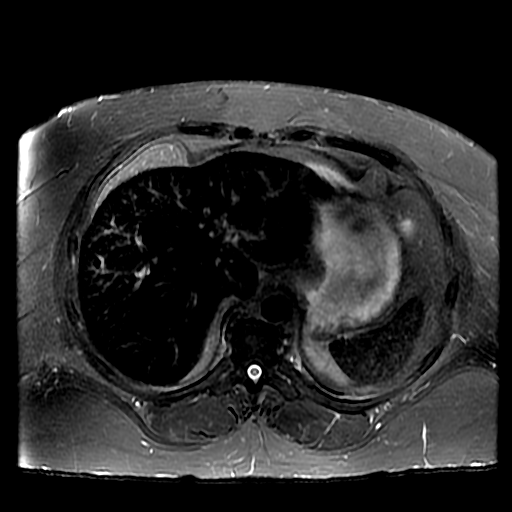
[im 36/36]
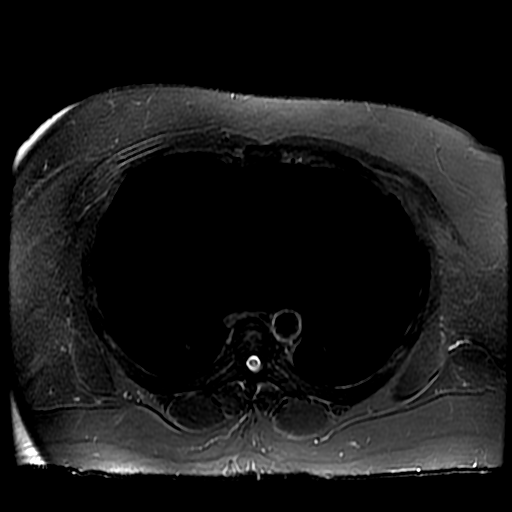

[Series 9: DWI b500 · axial · 6.0mm · 1.48mm/px · z∈[+13,+223]mm · 7 of 72 slices shown]
[im 1/72]
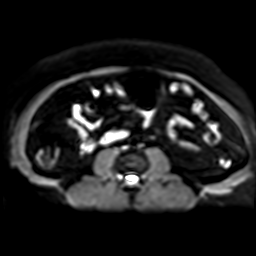
[im 12/72]
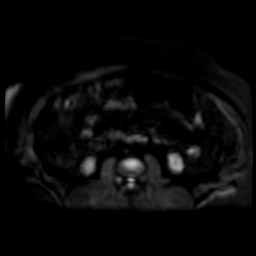
[im 24/72]
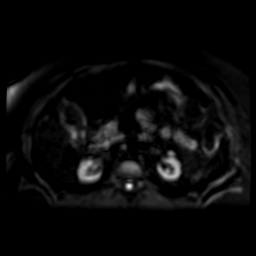
[im 36/72]
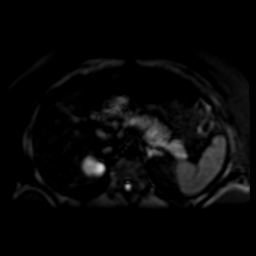
[im 48/72]
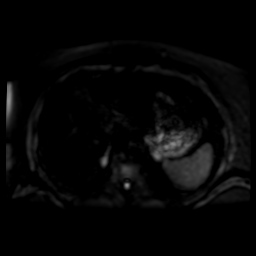
[im 60/72]
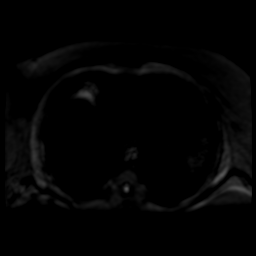
[im 72/72]
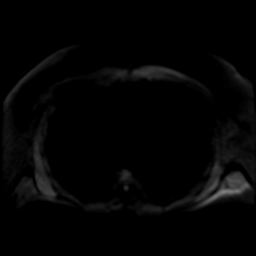

[Series 12: bSSFP fat-sat · axial · 6.0mm · 0.74mm/px · z∈[+13,+223]mm · 4 of 36 slices shown]
[im 1/36]
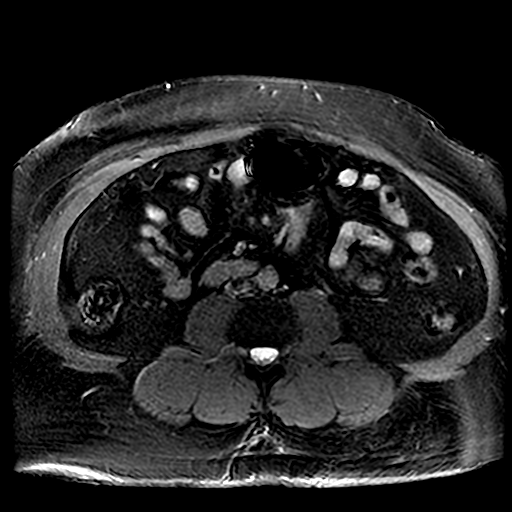
[im 12/36]
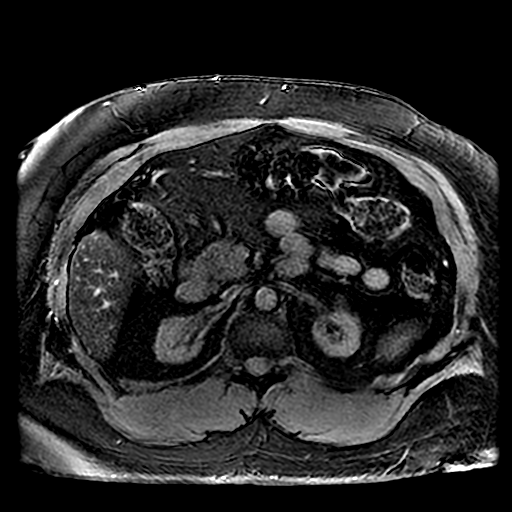
[im 24/36]
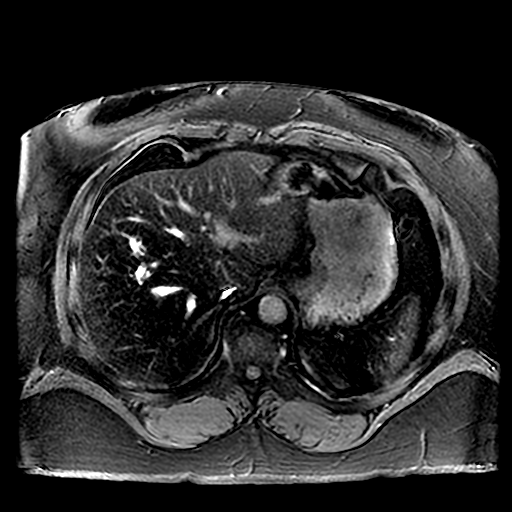
[im 36/36]
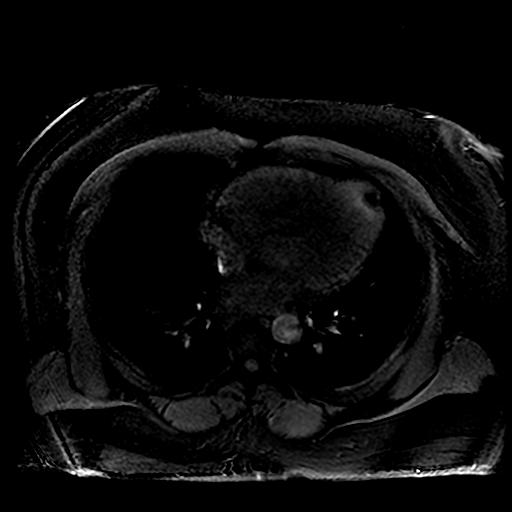

[Series 801: T1 dynamic · axial · 3.8mm · 0.82mm/px · z∈[+10,+52]mm · 2 of 58 slices shown]
[im 1/58]
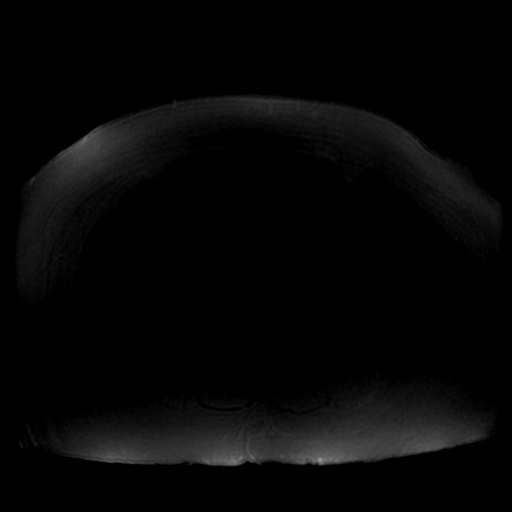
[im 12/58]
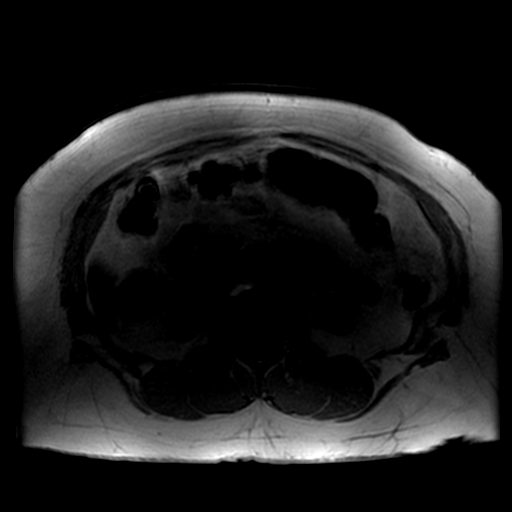

[23 of 48 positions shown; findings below may reference images not displayed]

FINDINGS: Lower chest: No acute findings.

Hepatobiliary: No masses visualized on this unenhanced exam. Tiny
sub-cm cyst again seen near the junction of the right and left
hepatic lobes which is stable. Diffuse T2 hypointensity of hepatic
parenchyma is consistent with hemosiderosis. Gallbladder is
unremarkable.

Pancreas: No mass or inflammatory process visualized on this
unenhanced exam.

Spleen: Within normal limits in size. No focal splenic lesions
identified, however diffuse T2 hypointensity splenic parenchyma is
consistent with hemosiderosis.

Adrenals/Urinary tract: Stable tiny approximately 1 cm right adrenal
nodule shafts signal dropout on chemical shift imaging, consistent
with a benign adrenal adenoma.

Bilateral diffuse renal parenchymal atrophy noted. A complex cystic
lesion is again seen in upper pole of right kidney which shows
multiple thin internal septations and measures 3.5 x 3.1 cm on image
[DATE]. No definite solid component visualized. A smaller complex cyst
with mild heterogeneous T1 and T2 hyperintensity is seen in the
anterior midpole the left kidney measuring 1.9 x 1.8 cm on image
[DATE]. The show no significant change since prior study allowing for
lack of IV contrast, these lesions are best characterized as
indeterminate Bosniak category 2F lesions. No evidence
hydronephrosis.

Stomach/Bowel: No evidence of obstruction, inflammatory process, or
abnormal fluid collections. Diverticulosis seen involving the
descending colon, without evidence of diverticulitis in this region.

Vascular/Lymphatic: No pathologically enlarged lymph nodes
identified. No evidence of abdominal aortic aneurysm.

Other:  None.

Musculoskeletal:  No suspicious bone lesions identified.
IMPRESSION: Stable complex cystic lesions in both kidneys, with incomplete
characterization due to lack of IV contrast. These are best
categorized as indeterminate Bosniak category 2F cystic lesions, and
follow-up by MRI is recommended again in 6 months. This
recommendation follows ACR consensus guidelines: Management of the
Incidental Renal Mass on CT: A White Paper of the ACR Incidental
Findings Committee. [HOSPITAL] 3768;[DATE].

Stable small benign right adrenal adenoma.

Stable findings consistent with transfusional hemosiderosis.

## 2017-12-14 ENCOUNTER — Ambulatory Visit (HOSPITAL_COMMUNITY)
Admission: RE | Admit: 2017-12-14 | Discharge: 2017-12-14 | Disposition: A | Discharge status: Home / Self Care | Payer: Medicare Other | Source: Ambulatory Visit | Attending: Urology | Admitting: Urology

## 2017-12-14 DIAGNOSIS — R310 Gross hematuria: Secondary | ICD-10-CM

## 2017-12-14 DIAGNOSIS — Z992 Dependence on renal dialysis: Secondary | ICD-10-CM | POA: Diagnosis not present

## 2017-12-14 DIAGNOSIS — N2889 Other specified disorders of kidney and ureter: Secondary | ICD-10-CM | POA: Insufficient documentation

## 2017-12-14 DIAGNOSIS — N186 End stage renal disease: Secondary | ICD-10-CM | POA: Diagnosis not present

## 2017-12-14 DIAGNOSIS — N2581 Secondary hyperparathyroidism of renal origin: Secondary | ICD-10-CM | POA: Diagnosis not present

## 2017-12-14 DIAGNOSIS — D509 Iron deficiency anemia, unspecified: Secondary | ICD-10-CM | POA: Diagnosis not present

## 2017-12-14 DIAGNOSIS — R112 Nausea with vomiting, unspecified: Secondary | ICD-10-CM | POA: Diagnosis not present

## 2017-12-14 MED ORDER — DEXTROSE 5 % IV SOLN
3.0000 g | INTRAVENOUS | Status: AC
  Administered 2017-12-15: 3 g via INTRAVENOUS
  Filled 2017-12-14: qty 3

## 2017-12-14 NOTE — Anesthesia Preprocedure Evaluation (Addendum)
Anesthesia Evaluation  Patient identified by MRN, date of birth, ID band Patient awake    Reviewed: Allergy & Precautions, NPO status , Patient's Chart, lab work & pertinent test results  Airway Mallampati: III  TM Distance: >3 FB Neck ROM: Full    Dental   Pulmonary neg pulmonary ROS,    breath sounds clear to auscultation       Cardiovascular hypertension, + DVT  + dysrhythmias  Rhythm:Regular Rate:Normal     Neuro/Psych negative neurological ROS     GI/Hepatic negative GI ROS, Neg liver ROS,   Endo/Other  diabetes, Type 2  Renal/GU ESRF and DialysisRenal disease     Musculoskeletal  (+) Arthritis ,   Abdominal   Peds  Hematology  (+) Blood dyscrasia, anemia ,   Anesthesia Other Findings   Reproductive/Obstetrics                            Lab Results  Component Value Date   WBC 11.1 (H) 08/01/2017   HGB 16.3 09/23/2017   HCT 48.0 09/23/2017   MCV 99.0 08/01/2017   PLT 108 (L) 08/01/2017   Lab Results  Component Value Date   CREATININE 16.70 (H) 09/23/2017   BUN 50 (H) 09/23/2017   NA 137 09/23/2017   K 4.9 09/23/2017   CL 103 09/23/2017   CO2 27 08/01/2017    Anesthesia Physical Anesthesia Plan  ASA: III  Anesthesia Plan: MAC   Post-op Pain Management:    Induction: Intravenous  PONV Risk Score and Plan: 1 and Propofol infusion, Ondansetron and Treatment may vary due to age or medical condition  Airway Management Planned: Natural Airway and Simple Face Mask  Additional Equipment:   Intra-op Plan:   Post-operative Plan:   Informed Consent: I have reviewed the patients History and Physical, chart, labs and discussed the procedure including the risks, benefits and alternatives for the proposed anesthesia with the patient or authorized representative who has indicated his/her understanding and acceptance.   Dental advisory given  Plan Discussed with:  CRNA  Anesthesia Plan Comments:        Anesthesia Quick Evaluation

## 2017-12-15 ENCOUNTER — Ambulatory Visit (HOSPITAL_COMMUNITY): Payer: Medicare Other | Admitting: Anesthesiology

## 2017-12-15 ENCOUNTER — Encounter (HOSPITAL_COMMUNITY): Payer: Self-pay | Admitting: Surgery

## 2017-12-15 ENCOUNTER — Ambulatory Visit (HOSPITAL_COMMUNITY)
Admission: RE | Admit: 2017-12-15 | Discharge: 2017-12-15 | Disposition: A | Discharge status: Home / Self Care | Payer: Medicare Other | Source: Ambulatory Visit | Attending: Vascular Surgery | Admitting: Vascular Surgery

## 2017-12-15 ENCOUNTER — Encounter (HOSPITAL_COMMUNITY)
Admission: RE | Disposition: A | Discharge status: Home / Self Care | Payer: Self-pay | Source: Ambulatory Visit | Attending: Vascular Surgery

## 2017-12-15 ENCOUNTER — Other Ambulatory Visit: Payer: Self-pay

## 2017-12-15 DIAGNOSIS — Z79899 Other long term (current) drug therapy: Secondary | ICD-10-CM | POA: Insufficient documentation

## 2017-12-15 DIAGNOSIS — N529 Male erectile dysfunction, unspecified: Secondary | ICD-10-CM | POA: Insufficient documentation

## 2017-12-15 DIAGNOSIS — Z9889 Other specified postprocedural states: Secondary | ICD-10-CM | POA: Diagnosis not present

## 2017-12-15 DIAGNOSIS — Z86718 Personal history of other venous thrombosis and embolism: Secondary | ICD-10-CM | POA: Insufficient documentation

## 2017-12-15 DIAGNOSIS — Z7901 Long term (current) use of anticoagulants: Secondary | ICD-10-CM | POA: Diagnosis not present

## 2017-12-15 DIAGNOSIS — N186 End stage renal disease: Secondary | ICD-10-CM | POA: Diagnosis not present

## 2017-12-15 DIAGNOSIS — I120 Hypertensive chronic kidney disease with stage 5 chronic kidney disease or end stage renal disease: Secondary | ICD-10-CM | POA: Insufficient documentation

## 2017-12-15 DIAGNOSIS — Z992 Dependence on renal dialysis: Secondary | ICD-10-CM | POA: Insufficient documentation

## 2017-12-15 DIAGNOSIS — E1140 Type 2 diabetes mellitus with diabetic neuropathy, unspecified: Secondary | ICD-10-CM | POA: Diagnosis not present

## 2017-12-15 DIAGNOSIS — E1122 Type 2 diabetes mellitus with diabetic chronic kidney disease: Secondary | ICD-10-CM | POA: Diagnosis not present

## 2017-12-15 DIAGNOSIS — M109 Gout, unspecified: Secondary | ICD-10-CM | POA: Insufficient documentation

## 2017-12-15 DIAGNOSIS — M1990 Unspecified osteoarthritis, unspecified site: Secondary | ICD-10-CM | POA: Diagnosis not present

## 2017-12-15 DIAGNOSIS — N185 Chronic kidney disease, stage 5: Secondary | ICD-10-CM | POA: Diagnosis not present

## 2017-12-15 DIAGNOSIS — N400 Enlarged prostate without lower urinary tract symptoms: Secondary | ICD-10-CM | POA: Insufficient documentation

## 2017-12-15 DIAGNOSIS — D631 Anemia in chronic kidney disease: Secondary | ICD-10-CM | POA: Diagnosis not present

## 2017-12-15 DIAGNOSIS — Z881 Allergy status to other antibiotic agents status: Secondary | ICD-10-CM | POA: Diagnosis not present

## 2017-12-15 HISTORY — PX: AV FISTULA PLACEMENT: SHX1204

## 2017-12-15 LAB — GLUCOSE, CAPILLARY
Glucose-Capillary: 77 mg/dL (ref 70–99)
Glucose-Capillary: 66 mg/dL — ABNORMAL LOW (ref 70–99)
Glucose-Capillary: 70 mg/dL (ref 70–99)
Glucose-Capillary: 82 mg/dL (ref 70–99)

## 2017-12-15 LAB — POCT I-STAT 4, (NA,K, GLUC, HGB,HCT)
Glucose, Bld: 86 mg/dL (ref 70–99)
HEMATOCRIT: 43 % (ref 39.0–52.0)
HEMOGLOBIN: 14.6 g/dL (ref 13.0–17.0)
Potassium: 4 mmol/L (ref 3.5–5.1)
Sodium: 138 mmol/L (ref 135–145)

## 2017-12-15 LAB — PROTIME-INR
INR: 1.1
Prothrombin Time: 14.1 seconds (ref 11.4–15.2)

## 2017-12-15 SURGERY — ARTERIOVENOUS (AV) FISTULA CREATION
Anesthesia: Monitor Anesthesia Care | Laterality: Right

## 2017-12-15 MED ORDER — HEPARIN SODIUM (PORCINE) 1000 UNIT/ML IJ SOLN
INTRAMUSCULAR | Status: AC
  Filled 2017-12-15: qty 1

## 2017-12-15 MED ORDER — 0.9 % SODIUM CHLORIDE (POUR BTL) OPTIME
TOPICAL | Status: DC | PRN
  Administered 2017-12-15: 1000 mL

## 2017-12-15 MED ORDER — FENTANYL CITRATE (PF) 100 MCG/2ML IJ SOLN
25.0000 ug | INTRAMUSCULAR | Status: DC | PRN
  Administered 2017-12-15: 25 ug via INTRAVENOUS

## 2017-12-15 MED ORDER — FENTANYL CITRATE (PF) 250 MCG/5ML IJ SOLN
INTRAMUSCULAR | Status: DC | PRN
  Administered 2017-12-15: 50 ug via INTRAVENOUS

## 2017-12-15 MED ORDER — ONDANSETRON HCL 4 MG/2ML IJ SOLN
INTRAMUSCULAR | Status: AC
  Filled 2017-12-15: qty 2

## 2017-12-15 MED ORDER — PROPOFOL 500 MG/50ML IV EMUL
INTRAVENOUS | Status: DC | PRN
  Administered 2017-12-15: 85 ug/kg/min via INTRAVENOUS

## 2017-12-15 MED ORDER — SODIUM CHLORIDE 0.9 % IV SOLN
INTRAVENOUS | Status: DC
  Administered 2017-12-15: 07:00:00 via INTRAVENOUS

## 2017-12-15 MED ORDER — SODIUM CHLORIDE 0.9 % IV SOLN
INTRAVENOUS | Status: DC | PRN
  Administered 2017-12-15: 08:00:00

## 2017-12-15 MED ORDER — MIDAZOLAM HCL 2 MG/2ML IJ SOLN
INTRAMUSCULAR | Status: DC | PRN
  Administered 2017-12-15: 2 mg via INTRAVENOUS

## 2017-12-15 MED ORDER — FENTANYL CITRATE (PF) 250 MCG/5ML IJ SOLN
INTRAMUSCULAR | Status: AC
  Filled 2017-12-15: qty 5

## 2017-12-15 MED ORDER — LIDOCAINE HCL (PF) 1 % IJ SOLN
INTRAMUSCULAR | Status: DC | PRN
  Administered 2017-12-15: 30 mL

## 2017-12-15 MED ORDER — PROPOFOL 1000 MG/100ML IV EMUL
INTRAVENOUS | Status: AC
  Filled 2017-12-15: qty 100

## 2017-12-15 MED ORDER — SODIUM CHLORIDE 0.9 % IV SOLN
INTRAVENOUS | Status: AC
  Filled 2017-12-15: qty 1.2

## 2017-12-15 MED ORDER — MIDAZOLAM HCL 2 MG/2ML IJ SOLN
INTRAMUSCULAR | Status: AC
  Filled 2017-12-15: qty 2

## 2017-12-15 MED ORDER — PHENYLEPHRINE 40 MCG/ML (10ML) SYRINGE FOR IV PUSH (FOR BLOOD PRESSURE SUPPORT)
PREFILLED_SYRINGE | INTRAVENOUS | Status: AC
  Filled 2017-12-15: qty 10

## 2017-12-15 MED ORDER — HEPARIN SODIUM (PORCINE) 1000 UNIT/ML IJ SOLN
1900.0000 [IU] | Freq: Once | INTRAMUSCULAR | Status: AC
  Administered 2017-12-15: 1900 [IU] via INTRAVENOUS

## 2017-12-15 MED ORDER — LIDOCAINE HCL (CARDIAC) PF 100 MG/5ML IV SOSY
PREFILLED_SYRINGE | INTRAVENOUS | Status: DC | PRN
  Administered 2017-12-15: 100 mg via INTRATRACHEAL

## 2017-12-15 MED ORDER — TRAMADOL HCL 50 MG PO TABS: 50.0000 mg | ORAL_TABLET | Freq: Four times a day (QID) | ORAL | 0 refills | Status: DC | PRN

## 2017-12-15 MED ORDER — LIDOCAINE HCL (PF) 1 % IJ SOLN
INTRAMUSCULAR | Status: AC
  Filled 2017-12-15: qty 30

## 2017-12-15 MED ORDER — SODIUM CHLORIDE 0.9 % IV SOLN
INTRAVENOUS | Status: DC | PRN
  Administered 2017-12-15: 35 ug/min via INTRAVENOUS

## 2017-12-15 MED ORDER — PROPOFOL 10 MG/ML IV BOLUS
INTRAVENOUS | Status: AC
  Filled 2017-12-15: qty 20

## 2017-12-15 MED ORDER — PHENYLEPHRINE HCL 10 MG/ML IJ SOLN
INTRAMUSCULAR | Status: DC | PRN
  Administered 2017-12-15: 40 ug via INTRAVENOUS
  Administered 2017-12-15 (×2): 120 ug via INTRAVENOUS
  Administered 2017-12-15: 80 ug via INTRAVENOUS
  Administered 2017-12-15: 120 ug via INTRAVENOUS

## 2017-12-15 MED ORDER — HEPARIN SODIUM (PORCINE) 1000 UNIT/ML IJ SOLN
INTRAMUSCULAR | Status: DC | PRN
  Administered 2017-12-15: 5000 [IU] via INTRAVENOUS

## 2017-12-15 MED ORDER — FENTANYL CITRATE (PF) 100 MCG/2ML IJ SOLN
INTRAMUSCULAR | Status: AC
  Filled 2017-12-15: qty 2

## 2017-12-15 SURGICAL SUPPLY — 40 items
ADH SKN CLS APL DERMABOND .7 (GAUZE/BANDAGES/DRESSINGS) ×1
ARMBAND PINK RESTRICT EXTREMIT (MISCELLANEOUS) ×4 IMPLANT
CANISTER SUCT 3000ML PPV (MISCELLANEOUS) ×2 IMPLANT
CANNULA VESSEL 3MM 2 BLNT TIP (CANNULA) ×2 IMPLANT
CLIP VESOCCLUDE MED 6/CT (CLIP) ×2 IMPLANT
CLIP VESOCCLUDE SM WIDE 6/CT (CLIP) ×2 IMPLANT
COVER PROBE W GEL 5X96 (DRAPES) ×1 IMPLANT
COVER WAND RF STERILE (DRAPES) ×2 IMPLANT
DECANTER SPIKE VIAL GLASS SM (MISCELLANEOUS) ×2 IMPLANT
DERMABOND ADVANCED (GAUZE/BANDAGES/DRESSINGS) ×1
DERMABOND ADVANCED .7 DNX12 (GAUZE/BANDAGES/DRESSINGS) ×1 IMPLANT
DRAIN PENROSE 1/4X12 LTX STRL (WOUND CARE) ×2 IMPLANT
ELECT REM PT RETURN 9FT ADLT (ELECTROSURGICAL) ×2
ELECTRODE REM PT RTRN 9FT ADLT (ELECTROSURGICAL) ×1 IMPLANT
GLOVE BIO SURGEON STRL SZ 6.5 (GLOVE) ×2 IMPLANT
GLOVE BIO SURGEON STRL SZ7.5 (GLOVE) ×2 IMPLANT
GLOVE BIOGEL PI IND STRL 6.5 (GLOVE) IMPLANT
GLOVE BIOGEL PI IND STRL 7.5 (GLOVE) IMPLANT
GLOVE BIOGEL PI INDICATOR 6.5 (GLOVE) ×4
GLOVE BIOGEL PI INDICATOR 7.5 (GLOVE) ×1
GLOVE ECLIPSE 6.5 STRL STRAW (GLOVE) ×2 IMPLANT
GLOVE ECLIPSE 7.0 STRL STRAW (GLOVE) ×2 IMPLANT
GLOVE SURG SS PI 6.5 STRL IVOR (GLOVE) ×2 IMPLANT
GOWN STRL REUS W/ TWL LRG LVL3 (GOWN DISPOSABLE) ×3 IMPLANT
GOWN STRL REUS W/TWL LRG LVL3 (GOWN DISPOSABLE) ×14
KIT BASIN OR (CUSTOM PROCEDURE TRAY) ×2 IMPLANT
KIT TURNOVER KIT B (KITS) ×2 IMPLANT
LOOP VESSEL MINI RED (MISCELLANEOUS) ×1 IMPLANT
NS IRRIG 1000ML POUR BTL (IV SOLUTION) ×2 IMPLANT
PACK CV ACCESS (CUSTOM PROCEDURE TRAY) ×2 IMPLANT
PAD ARMBOARD 7.5X6 YLW CONV (MISCELLANEOUS) ×4 IMPLANT
SPONGE SURGIFOAM ABS GEL 100 (HEMOSTASIS) IMPLANT
SUT PROLENE 6 0 BV (SUTURE) ×2 IMPLANT
SUT PROLENE 7 0 BV 1 (SUTURE) ×5 IMPLANT
SUT VIC AB 3-0 SH 27 (SUTURE) ×2
SUT VIC AB 3-0 SH 27X BRD (SUTURE) ×1 IMPLANT
SUT VICRYL 4-0 PS2 18IN ABS (SUTURE) ×2 IMPLANT
TOWEL GREEN STERILE (TOWEL DISPOSABLE) ×2 IMPLANT
UNDERPAD 30X30 (UNDERPADS AND DIAPERS) ×2 IMPLANT
WATER STERILE IRR 1000ML POUR (IV SOLUTION) ×2 IMPLANT

## 2017-12-15 NOTE — Interval H&P Note (Signed)
History and Physical Interval Note:  12/15/2017 7:25 AM  Jack Irwin  has presented today for surgery, with the diagnosis of end stage renal disease  The various methods of treatment have been discussed with the patient and family. After consideration of risks, benefits and other options for treatment, the patient has consented to  Procedure(s): ARTERIOVENOUS (AV) BRACHIOCEPHALIC FISTULA CREATION (Right) as a surgical intervention .  The patient's history has been reviewed, patient examined, no change in status, stable for surgery.  I have reviewed the patient's chart and labs.  Questions were answered to the patient's satisfaction.     Ruta Hinds

## 2017-12-15 NOTE — Anesthesia Postprocedure Evaluation (Signed)
Anesthesia Post Note  Patient: Jack Irwin  Procedure(s) Performed: ARTERIOVENOUS (AV) BRACHIOCEPHALIC FISTULA CREATION (Right )     Patient location during evaluation: PACU Anesthesia Type: MAC Level of consciousness: awake and alert Pain management: pain level controlled Vital Signs Assessment: post-procedure vital signs reviewed and stable Respiratory status: spontaneous breathing, nonlabored ventilation, respiratory function stable and patient connected to nasal cannula oxygen Cardiovascular status: stable and blood pressure returned to baseline Postop Assessment: no apparent nausea or vomiting Anesthetic complications: no    Last Vitals:  Vitals:   12/15/17 1023 12/15/17 1036  BP:  (!) 116/91  Pulse: 81   Resp:    Temp:    SpO2: 100%     Last Pain:  Vitals:   12/15/17 1021  TempSrc:   PainSc: 0-No pain                 Tiajuana Amass

## 2017-12-15 NOTE — Transfer of Care (Signed)
Immediate Anesthesia Transfer of Care Note  Patient: Jack Irwin  Procedure(s) Performed: ARTERIOVENOUS (AV) BRACHIOCEPHALIC FISTULA CREATION (Right )  Patient Location: PACU  Anesthesia Type:MAC  Level of Consciousness: awake, alert , oriented and patient cooperative  Airway & Oxygen Therapy: Patient Spontanous Breathing and Patient connected to nasal cannula oxygen  Post-op Assessment: Report given to RN and Post -op Vital signs reviewed and stable  Post vital signs: Reviewed and stable  Last Vitals:  Vitals Value Taken Time  BP 99/87 12/15/2017  9:35 AM  Temp    Pulse 86 12/15/2017  9:37 AM  Resp 24 12/15/2017  9:37 AM  SpO2 100 % 12/15/2017  9:37 AM  Vitals shown include unvalidated device data.  Last Pain:  Vitals:   12/15/17 0639  TempSrc:   PainSc: 0-No pain      Patients Stated Pain Goal: 2 (44/96/75 [id number])  Complications: No apparent anesthesia complications

## 2017-12-15 NOTE — Discharge Instructions (Signed)
° °  [facility] ° °Discharge Instructions ° °AV Fistula or Graft Surgery for Dialysis Access ° °Please refer to the following instructions for your post-procedure care. Your surgeon or physician assistant will discuss any changes with you. ° °Activity ° °You may drive the day following your surgery, if you are comfortable and no longer taking prescription pain medication. Resume full activity as the soreness in your incision resolves. ° °Bathing/Showering ° °You may shower after you go home. Keep your incision dry for 48 hours. Do not soak in a bathtub, hot tub, or swim until the incision heals completely. You may not shower if you have a hemodialysis catheter. ° °Incision Care ° °Clean your incision with mild soap and water after 48 hours. Pat the area dry with a clean towel. You do not need a bandage unless otherwise instructed. Do not apply any ointments or creams to your incision. You may have skin glue on your incision. Do not peel it off. It will come off on its own in about one week. Your arm may swell a bit after surgery. To reduce swelling use pillows to elevate your arm so it is above your heart. Your doctor will tell you if you need to lightly wrap your arm with an ACE bandage. ° °Diet ° °Resume your normal diet. There are not special food restrictions following this procedure. In order to heal from your surgery, it is CRITICAL to get adequate nutrition. Your body requires vitamins, minerals, and protein. Vegetables are the best source of vitamins and minerals. Vegetables also provide the perfect balance of protein. Processed food has little nutritional value, so try to avoid this. ° °Medications ° °Resume taking all of your medications. If your incision is causing pain, you may take over-the counter pain relievers such as acetaminophen (Tylenol). If you were prescribed a stronger pain medication, please be aware these medications can cause nausea and constipation. Prevent  nausea by taking the medication with a snack or meal. Avoid constipation by drinking plenty of fluids and eating foods with high amount of fiber, such as fruits, vegetables, and grains. Do not take Tylenol if you are taking prescription pain medications. ° ° ° ° °Follow up °Your surgeon may want to see you in the office following your access surgery. If so, this will be arranged at the time of your surgery. ° °Please call us immediately for any of the following conditions: ° °Increased pain, redness, drainage (pus) from your incision site °Fever of 101 degrees or higher °Severe or worsening pain at your incision site °Hand pain or numbness. ° °Reduce your risk of vascular disease: ° °Stop smoking. If you would like help, call [facility] at 1-800-QUIT-NOW (1-800-784-8669) or [place] at 336-586-4000 ° °Manage your cholesterol °Maintain a desired weight °Control your diabetes °Keep your blood pressure down ° °Dialysis ° °It will take several weeks to several months for your new dialysis access to be ready for use. Your surgeon will determine when it is OK to use it. Your nephrologist will continue to direct your dialysis. You can continue to use your Permcath until your new access is ready for use. ° °If you have any questions, please call the office at [phone]. ° °

## 2017-12-15 NOTE — Op Note (Signed)
Procedure: Right Brachial Cephalic AV fistula  Preop: ESRD  Postop: ESRD  Anesthesia: Local with sedation  Assistant: Gerri Lins, PA-C  Findings: 9.6-2 mm cephalic vein, 80mm artery  Procedure: After obtaining informed consent, the patient was taken to the operating room.  After adequate sedation, the right upper extremity was prepped and draped in usual sterile fashion.  A transverse incision was then made near the antecubital crease the right arm. The incision was carried into the subcutaneous tissues down to level of the cephalic vein. The cephalic vein was approximately 2.5-3 mm in diameter. This was dissected free circumferentially and small side branches ligated and divided between silk ties or clips. Next the brachial artery was dissected free in the medial portion of the incision. The artery was  3 mm in diameter. The vessel loops were placed proximal and distal to the planned site of arteriotomy. The patient was given 5000 units of intravenous heparin. After appropriate circulation time, the vessel loops were used to control the artery. A longitudinal opening was made in the brachial artery.  The vein was ligated distally with a 2-0 silk tie. The vein was controlled proximally with a fine bulldog clamp. It was slightly spatulated.  The vein was then swung over to the artery and sewn end of vein to side of artery using a running 7-0 Prolene suture. Just prior to completion of the anastomosis, everything was fore bled back bled and thoroughly flushed. The anastomosis was secured, vessel loops released, and there was a palpable thrill in the fistula immediately. After hemostasis was obtained, the subcutaneous tissues were reapproximated using a running 3-0 Vicryl suture. The skin was then closed with a 4 0 Vicryl subcuticular stitch. Dermabond was applied to the skin incision.    Ruta Hinds, MD [facility] Office: [phone] Pager: [phone]

## 2017-12-16 ENCOUNTER — Encounter (HOSPITAL_COMMUNITY): Payer: Self-pay | Admitting: Vascular Surgery

## 2017-12-16 ENCOUNTER — Telehealth: Payer: Self-pay | Admitting: Vascular Surgery

## 2017-12-16 DIAGNOSIS — N2581 Secondary hyperparathyroidism of renal origin: Secondary | ICD-10-CM | POA: Diagnosis not present

## 2017-12-16 DIAGNOSIS — D509 Iron deficiency anemia, unspecified: Secondary | ICD-10-CM | POA: Diagnosis not present

## 2017-12-16 DIAGNOSIS — R112 Nausea with vomiting, unspecified: Secondary | ICD-10-CM | POA: Diagnosis not present

## 2017-12-16 DIAGNOSIS — Z992 Dependence on renal dialysis: Secondary | ICD-10-CM | POA: Diagnosis not present

## 2017-12-16 DIAGNOSIS — N186 End stage renal disease: Secondary | ICD-10-CM | POA: Diagnosis not present

## 2017-12-16 NOTE — Telephone Encounter (Signed)
-----   Message from Mena Goes, RN sent at 12/15/2017  9:42 AM EST ----- Regarding: appt in addition to previous postop wound check   ----- Message ----- From: Ulyses Amor, PA-C Sent: 12/15/2017   9:28 AM EST To: [facility] Admin Pool, [facility] Charge Pool   S/P creation of right UE AV fistula f/u on a Thursday with Dr. Oneida Alar or [facility] in 2 weeks for incisional check.  6 weeks f/u for fistula duplex in [facility].

## 2017-12-16 NOTE — Telephone Encounter (Signed)
sch appt spk to pt 01/07/18 115pm wound check

## 2017-12-18 ENCOUNTER — Telehealth: Payer: Self-pay

## 2017-12-18 DIAGNOSIS — N186 End stage renal disease: Secondary | ICD-10-CM | POA: Diagnosis not present

## 2017-12-18 DIAGNOSIS — R112 Nausea with vomiting, unspecified: Secondary | ICD-10-CM | POA: Diagnosis not present

## 2017-12-18 DIAGNOSIS — N2581 Secondary hyperparathyroidism of renal origin: Secondary | ICD-10-CM | POA: Diagnosis not present

## 2017-12-18 DIAGNOSIS — D509 Iron deficiency anemia, unspecified: Secondary | ICD-10-CM | POA: Diagnosis not present

## 2017-12-18 DIAGNOSIS — Z992 Dependence on renal dialysis: Secondary | ICD-10-CM | POA: Diagnosis not present

## 2017-12-18 NOTE — Telephone Encounter (Signed)
Called, no answer. Left a message for patient to call back. Thanks!  

## 2017-12-21 ENCOUNTER — Other Ambulatory Visit: Payer: Self-pay

## 2017-12-21 DIAGNOSIS — N186 End stage renal disease: Secondary | ICD-10-CM

## 2017-12-21 DIAGNOSIS — Z992 Dependence on renal dialysis: Principal | ICD-10-CM

## 2017-12-21 DIAGNOSIS — R112 Nausea with vomiting, unspecified: Secondary | ICD-10-CM | POA: Diagnosis not present

## 2017-12-21 DIAGNOSIS — D509 Iron deficiency anemia, unspecified: Secondary | ICD-10-CM | POA: Diagnosis not present

## 2017-12-21 DIAGNOSIS — N2581 Secondary hyperparathyroidism of renal origin: Secondary | ICD-10-CM | POA: Diagnosis not present

## 2017-12-23 ENCOUNTER — Ambulatory Visit (INDEPENDENT_AMBULATORY_CARE_PROVIDER_SITE_OTHER): Payer: Medicare Other | Admitting: Urology

## 2017-12-23 DIAGNOSIS — R102 Pelvic and perineal pain: Secondary | ICD-10-CM | POA: Diagnosis not present

## 2017-12-25 DIAGNOSIS — D509 Iron deficiency anemia, unspecified: Secondary | ICD-10-CM | POA: Diagnosis not present

## 2017-12-25 DIAGNOSIS — R112 Nausea with vomiting, unspecified: Secondary | ICD-10-CM | POA: Diagnosis not present

## 2017-12-25 DIAGNOSIS — N2581 Secondary hyperparathyroidism of renal origin: Secondary | ICD-10-CM | POA: Diagnosis not present

## 2017-12-25 DIAGNOSIS — N186 End stage renal disease: Secondary | ICD-10-CM | POA: Diagnosis not present

## 2017-12-25 DIAGNOSIS — Z992 Dependence on renal dialysis: Secondary | ICD-10-CM | POA: Diagnosis not present

## 2017-12-28 DIAGNOSIS — Z992 Dependence on renal dialysis: Secondary | ICD-10-CM | POA: Diagnosis not present

## 2017-12-28 DIAGNOSIS — N186 End stage renal disease: Secondary | ICD-10-CM | POA: Diagnosis not present

## 2017-12-28 DIAGNOSIS — R112 Nausea with vomiting, unspecified: Secondary | ICD-10-CM | POA: Diagnosis not present

## 2017-12-28 DIAGNOSIS — D509 Iron deficiency anemia, unspecified: Secondary | ICD-10-CM | POA: Diagnosis not present

## 2017-12-28 DIAGNOSIS — N2581 Secondary hyperparathyroidism of renal origin: Secondary | ICD-10-CM | POA: Diagnosis not present

## 2017-12-30 ENCOUNTER — Ambulatory Visit: Payer: Self-pay

## 2017-12-30 DIAGNOSIS — D509 Iron deficiency anemia, unspecified: Secondary | ICD-10-CM | POA: Diagnosis not present

## 2017-12-30 DIAGNOSIS — N2581 Secondary hyperparathyroidism of renal origin: Secondary | ICD-10-CM | POA: Diagnosis not present

## 2017-12-30 DIAGNOSIS — Z992 Dependence on renal dialysis: Secondary | ICD-10-CM | POA: Diagnosis not present

## 2017-12-30 DIAGNOSIS — R112 Nausea with vomiting, unspecified: Secondary | ICD-10-CM | POA: Diagnosis not present

## 2017-12-30 DIAGNOSIS — N186 End stage renal disease: Secondary | ICD-10-CM | POA: Diagnosis not present

## 2018-01-01 DIAGNOSIS — Z992 Dependence on renal dialysis: Secondary | ICD-10-CM | POA: Diagnosis not present

## 2018-01-01 DIAGNOSIS — N186 End stage renal disease: Secondary | ICD-10-CM | POA: Diagnosis not present

## 2018-01-01 DIAGNOSIS — R112 Nausea with vomiting, unspecified: Secondary | ICD-10-CM | POA: Diagnosis not present

## 2018-01-01 DIAGNOSIS — D509 Iron deficiency anemia, unspecified: Secondary | ICD-10-CM | POA: Diagnosis not present

## 2018-01-01 DIAGNOSIS — N2581 Secondary hyperparathyroidism of renal origin: Secondary | ICD-10-CM | POA: Diagnosis not present

## 2018-01-02 DIAGNOSIS — N186 End stage renal disease: Secondary | ICD-10-CM | POA: Diagnosis not present

## 2018-01-02 DIAGNOSIS — Z992 Dependence on renal dialysis: Secondary | ICD-10-CM | POA: Diagnosis not present

## 2018-01-04 DIAGNOSIS — N2581 Secondary hyperparathyroidism of renal origin: Secondary | ICD-10-CM | POA: Diagnosis not present

## 2018-01-04 DIAGNOSIS — D509 Iron deficiency anemia, unspecified: Secondary | ICD-10-CM | POA: Diagnosis not present

## 2018-01-04 DIAGNOSIS — Z992 Dependence on renal dialysis: Secondary | ICD-10-CM | POA: Diagnosis not present

## 2018-01-04 DIAGNOSIS — N186 End stage renal disease: Secondary | ICD-10-CM | POA: Diagnosis not present

## 2018-01-06 ENCOUNTER — Telehealth: Payer: Self-pay

## 2018-01-06 DIAGNOSIS — D509 Iron deficiency anemia, unspecified: Secondary | ICD-10-CM | POA: Diagnosis not present

## 2018-01-06 DIAGNOSIS — N2581 Secondary hyperparathyroidism of renal origin: Secondary | ICD-10-CM | POA: Diagnosis not present

## 2018-01-06 DIAGNOSIS — Z992 Dependence on renal dialysis: Secondary | ICD-10-CM | POA: Diagnosis not present

## 2018-01-06 DIAGNOSIS — N186 End stage renal disease: Secondary | ICD-10-CM | POA: Diagnosis not present

## 2018-01-06 NOTE — Telephone Encounter (Signed)
Called, no answer. Left a message for patient to call back. Thanks!  

## 2018-01-07 ENCOUNTER — Other Ambulatory Visit: Payer: Self-pay

## 2018-01-07 ENCOUNTER — Ambulatory Visit: Payer: Self-pay | Admitting: Physician Assistant

## 2018-01-07 MED ORDER — GABAPENTIN 100 MG PO CAPS: 100.0000 mg | ORAL_CAPSULE | Freq: Every day | ORAL | 3 refills | Status: DC

## 2018-01-08 ENCOUNTER — Encounter: Payer: Self-pay | Admitting: General Practice

## 2018-01-08 DIAGNOSIS — N186 End stage renal disease: Secondary | ICD-10-CM | POA: Diagnosis not present

## 2018-01-08 DIAGNOSIS — D509 Iron deficiency anemia, unspecified: Secondary | ICD-10-CM | POA: Diagnosis not present

## 2018-01-08 DIAGNOSIS — Z992 Dependence on renal dialysis: Secondary | ICD-10-CM | POA: Diagnosis not present

## 2018-01-08 DIAGNOSIS — N2581 Secondary hyperparathyroidism of renal origin: Secondary | ICD-10-CM | POA: Diagnosis not present

## 2018-01-11 ENCOUNTER — Other Ambulatory Visit: Payer: Self-pay | Admitting: Family Medicine

## 2018-01-11 DIAGNOSIS — Z992 Dependence on renal dialysis: Secondary | ICD-10-CM | POA: Diagnosis not present

## 2018-01-11 DIAGNOSIS — N529 Male erectile dysfunction, unspecified: Secondary | ICD-10-CM

## 2018-01-11 DIAGNOSIS — D509 Iron deficiency anemia, unspecified: Secondary | ICD-10-CM | POA: Diagnosis not present

## 2018-01-11 DIAGNOSIS — N186 End stage renal disease: Secondary | ICD-10-CM | POA: Diagnosis not present

## 2018-01-11 DIAGNOSIS — N2581 Secondary hyperparathyroidism of renal origin: Secondary | ICD-10-CM | POA: Diagnosis not present

## 2018-01-13 DIAGNOSIS — D509 Iron deficiency anemia, unspecified: Secondary | ICD-10-CM | POA: Diagnosis not present

## 2018-01-13 DIAGNOSIS — N2581 Secondary hyperparathyroidism of renal origin: Secondary | ICD-10-CM | POA: Diagnosis not present

## 2018-01-13 DIAGNOSIS — Z992 Dependence on renal dialysis: Secondary | ICD-10-CM | POA: Diagnosis not present

## 2018-01-13 DIAGNOSIS — N186 End stage renal disease: Secondary | ICD-10-CM | POA: Diagnosis not present

## 2018-01-15 DIAGNOSIS — N2581 Secondary hyperparathyroidism of renal origin: Secondary | ICD-10-CM | POA: Diagnosis not present

## 2018-01-15 DIAGNOSIS — N186 End stage renal disease: Secondary | ICD-10-CM | POA: Diagnosis not present

## 2018-01-15 DIAGNOSIS — D509 Iron deficiency anemia, unspecified: Secondary | ICD-10-CM | POA: Diagnosis not present

## 2018-01-15 DIAGNOSIS — Z992 Dependence on renal dialysis: Secondary | ICD-10-CM | POA: Diagnosis not present

## 2018-01-18 ENCOUNTER — Ambulatory Visit: Payer: Self-pay | Admitting: Family Medicine

## 2018-01-18 DIAGNOSIS — Z992 Dependence on renal dialysis: Secondary | ICD-10-CM | POA: Diagnosis not present

## 2018-01-18 DIAGNOSIS — D509 Iron deficiency anemia, unspecified: Secondary | ICD-10-CM | POA: Diagnosis not present

## 2018-01-18 DIAGNOSIS — N186 End stage renal disease: Secondary | ICD-10-CM | POA: Diagnosis not present

## 2018-01-18 DIAGNOSIS — N2581 Secondary hyperparathyroidism of renal origin: Secondary | ICD-10-CM | POA: Diagnosis not present

## 2018-01-22 DIAGNOSIS — D509 Iron deficiency anemia, unspecified: Secondary | ICD-10-CM | POA: Diagnosis not present

## 2018-01-22 DIAGNOSIS — Z992 Dependence on renal dialysis: Secondary | ICD-10-CM | POA: Diagnosis not present

## 2018-01-22 DIAGNOSIS — N2581 Secondary hyperparathyroidism of renal origin: Secondary | ICD-10-CM | POA: Diagnosis not present

## 2018-01-22 DIAGNOSIS — N186 End stage renal disease: Secondary | ICD-10-CM | POA: Diagnosis not present

## 2018-01-24 DIAGNOSIS — D509 Iron deficiency anemia, unspecified: Secondary | ICD-10-CM | POA: Diagnosis not present

## 2018-01-24 DIAGNOSIS — Z992 Dependence on renal dialysis: Secondary | ICD-10-CM | POA: Diagnosis not present

## 2018-01-24 DIAGNOSIS — N186 End stage renal disease: Secondary | ICD-10-CM | POA: Diagnosis not present

## 2018-01-24 DIAGNOSIS — N2581 Secondary hyperparathyroidism of renal origin: Secondary | ICD-10-CM | POA: Diagnosis not present

## 2018-01-25 ENCOUNTER — Encounter (HOSPITAL_COMMUNITY): Payer: Self-pay

## 2018-01-25 NOTE — Progress Notes (Deleted)
  POST OPERATIVE OFFICE NOTE    HX:  He has previously had a failed fistula on the left arm.  He now has an occluded left upper arm graft.  He also has an occluded right upper arm AV graft.  He is currently dialyzing via right sided catheter.  He is on chronic anticoagulation for prior DVT.  CC:  F/u for surgery  HPI:  This is a 52 y.o. male who is s/p Right Brachial Cephalic AV fistula.  He is here today for follow up fistula duplex to check for fistula maturity.  Allergies  Allergen Reactions  . Doxycycline Itching    Current Outpatient Medications  Medication Sig Dispense Refill  . acetaminophen (TYLENOL) 500 MG tablet Take 500 mg by mouth every 6 (six) hours as needed for mild pain.    [name] apixaban (ELIQUIS) 2.5 MG TABS tablet Take 1 tablet (2.5 mg total) by mouth 2 (two) times daily. 60 tablet 5  . colchicine 0.6 MG tablet Take 0.5 tablets (0.3 mg total) by mouth daily as needed (for gout). 30 tablet 3  . finasteride (PROSCAR) 5 MG tablet Take 5 mg by mouth daily.     [name] gabapentin (NEURONTIN) 100 MG capsule Take 1 capsule (100 mg total) by mouth daily. 30 capsule 3  . promethazine (PHENERGAN) 25 MG tablet Take 1 tablet (25 mg total) by mouth every 8 (eight) hours as needed for nausea or vomiting. (Patient not taking: Reported on 12/11/2017) 20 tablet 0  . sevelamer carbonate (RENVELA) 800 MG tablet Take 1 tablet (800 mg total) by mouth 2 (two) times daily between meals as needed (With snacks). 60 tablet 5  . sildenafil (REVATIO) 20 MG tablet DISSOLVE 1/2 TROCHE UNDER THE TONGUE 60 MINUTES PRIOR TO SEXUAL ENCOUNTER. 10 tablet 98  . traMADol (ULTRAM) 50 MG tablet Take 1 tablet (50 mg total) by mouth every 6 (six) hours as needed. 8 tablet 0  . ULORIC 40 MG tablet Take 1 tablet (40 mg total) by mouth daily. 30 tablet 5   No current facility-administered medications for this visit.      ROS:  See HPI  Physical Exam:  There were no vitals filed for this visit.  Incision:   *** Extremities:  *** Neuro: *** Abdomen:  ***  Assessment/Plan:  This is a 52 y.o. male who is s/p: ***  -***   Roxy Horseman PA-C [facility] [phone]  Clinic MD:  ***

## 2018-01-26 ENCOUNTER — Encounter: Payer: Self-pay | Admitting: General Practice

## 2018-01-26 DIAGNOSIS — N2581 Secondary hyperparathyroidism of renal origin: Secondary | ICD-10-CM | POA: Diagnosis not present

## 2018-01-26 DIAGNOSIS — D509 Iron deficiency anemia, unspecified: Secondary | ICD-10-CM | POA: Diagnosis not present

## 2018-01-26 DIAGNOSIS — N186 End stage renal disease: Secondary | ICD-10-CM | POA: Diagnosis not present

## 2018-01-26 DIAGNOSIS — Z992 Dependence on renal dialysis: Secondary | ICD-10-CM | POA: Diagnosis not present

## 2018-01-29 ENCOUNTER — Encounter: Payer: Self-pay | Admitting: Vascular Surgery

## 2018-01-29 DIAGNOSIS — D509 Iron deficiency anemia, unspecified: Secondary | ICD-10-CM | POA: Diagnosis not present

## 2018-01-29 DIAGNOSIS — N186 End stage renal disease: Secondary | ICD-10-CM | POA: Diagnosis not present

## 2018-01-29 DIAGNOSIS — Z992 Dependence on renal dialysis: Secondary | ICD-10-CM | POA: Diagnosis not present

## 2018-01-29 DIAGNOSIS — N2581 Secondary hyperparathyroidism of renal origin: Secondary | ICD-10-CM | POA: Diagnosis not present

## 2018-02-01 DIAGNOSIS — N2581 Secondary hyperparathyroidism of renal origin: Secondary | ICD-10-CM | POA: Diagnosis not present

## 2018-02-01 DIAGNOSIS — N186 End stage renal disease: Secondary | ICD-10-CM | POA: Diagnosis not present

## 2018-02-01 DIAGNOSIS — D509 Iron deficiency anemia, unspecified: Secondary | ICD-10-CM | POA: Diagnosis not present

## 2018-02-01 DIAGNOSIS — Z992 Dependence on renal dialysis: Secondary | ICD-10-CM | POA: Diagnosis not present

## 2018-02-02 DIAGNOSIS — N186 End stage renal disease: Secondary | ICD-10-CM | POA: Diagnosis not present

## 2018-02-02 DIAGNOSIS — Z992 Dependence on renal dialysis: Secondary | ICD-10-CM | POA: Diagnosis not present

## 2018-02-03 DIAGNOSIS — D509 Iron deficiency anemia, unspecified: Secondary | ICD-10-CM | POA: Diagnosis not present

## 2018-02-03 DIAGNOSIS — Z992 Dependence on renal dialysis: Secondary | ICD-10-CM | POA: Diagnosis not present

## 2018-02-03 DIAGNOSIS — N2581 Secondary hyperparathyroidism of renal origin: Secondary | ICD-10-CM | POA: Diagnosis not present

## 2018-02-03 DIAGNOSIS — N186 End stage renal disease: Secondary | ICD-10-CM | POA: Diagnosis not present

## 2018-02-05 DIAGNOSIS — N186 End stage renal disease: Secondary | ICD-10-CM | POA: Diagnosis not present

## 2018-02-05 DIAGNOSIS — Z992 Dependence on renal dialysis: Secondary | ICD-10-CM | POA: Diagnosis not present

## 2018-02-05 DIAGNOSIS — D509 Iron deficiency anemia, unspecified: Secondary | ICD-10-CM | POA: Diagnosis not present

## 2018-02-05 DIAGNOSIS — N2581 Secondary hyperparathyroidism of renal origin: Secondary | ICD-10-CM | POA: Diagnosis not present

## 2018-02-09 DIAGNOSIS — Z992 Dependence on renal dialysis: Secondary | ICD-10-CM | POA: Diagnosis not present

## 2018-02-09 DIAGNOSIS — D509 Iron deficiency anemia, unspecified: Secondary | ICD-10-CM | POA: Diagnosis not present

## 2018-02-09 DIAGNOSIS — N186 End stage renal disease: Secondary | ICD-10-CM | POA: Diagnosis not present

## 2018-02-09 DIAGNOSIS — N2581 Secondary hyperparathyroidism of renal origin: Secondary | ICD-10-CM | POA: Diagnosis not present

## 2018-02-11 DIAGNOSIS — H25093 Other age-related incipient cataract, bilateral: Secondary | ICD-10-CM | POA: Diagnosis not present

## 2018-02-12 DIAGNOSIS — Z992 Dependence on renal dialysis: Secondary | ICD-10-CM | POA: Diagnosis not present

## 2018-02-12 DIAGNOSIS — N2581 Secondary hyperparathyroidism of renal origin: Secondary | ICD-10-CM | POA: Diagnosis not present

## 2018-02-12 DIAGNOSIS — D509 Iron deficiency anemia, unspecified: Secondary | ICD-10-CM | POA: Diagnosis not present

## 2018-02-12 DIAGNOSIS — N186 End stage renal disease: Secondary | ICD-10-CM | POA: Diagnosis not present

## 2018-02-15 DIAGNOSIS — D509 Iron deficiency anemia, unspecified: Secondary | ICD-10-CM | POA: Diagnosis not present

## 2018-02-15 DIAGNOSIS — E119 Type 2 diabetes mellitus without complications: Secondary | ICD-10-CM | POA: Diagnosis not present

## 2018-02-15 DIAGNOSIS — N186 End stage renal disease: Secondary | ICD-10-CM | POA: Diagnosis not present

## 2018-02-15 DIAGNOSIS — N2581 Secondary hyperparathyroidism of renal origin: Secondary | ICD-10-CM | POA: Diagnosis not present

## 2018-02-15 DIAGNOSIS — Z992 Dependence on renal dialysis: Secondary | ICD-10-CM | POA: Diagnosis not present

## 2018-02-17 DIAGNOSIS — Z992 Dependence on renal dialysis: Secondary | ICD-10-CM | POA: Diagnosis not present

## 2018-02-17 DIAGNOSIS — D509 Iron deficiency anemia, unspecified: Secondary | ICD-10-CM | POA: Diagnosis not present

## 2018-02-17 DIAGNOSIS — N186 End stage renal disease: Secondary | ICD-10-CM | POA: Diagnosis not present

## 2018-02-17 DIAGNOSIS — N2581 Secondary hyperparathyroidism of renal origin: Secondary | ICD-10-CM | POA: Diagnosis not present

## 2018-02-19 DIAGNOSIS — N2581 Secondary hyperparathyroidism of renal origin: Secondary | ICD-10-CM | POA: Diagnosis not present

## 2018-02-19 DIAGNOSIS — D509 Iron deficiency anemia, unspecified: Secondary | ICD-10-CM | POA: Diagnosis not present

## 2018-02-19 DIAGNOSIS — Z992 Dependence on renal dialysis: Secondary | ICD-10-CM | POA: Diagnosis not present

## 2018-02-19 DIAGNOSIS — N186 End stage renal disease: Secondary | ICD-10-CM | POA: Diagnosis not present

## 2018-02-23 DIAGNOSIS — Z992 Dependence on renal dialysis: Secondary | ICD-10-CM | POA: Diagnosis not present

## 2018-02-23 DIAGNOSIS — N186 End stage renal disease: Secondary | ICD-10-CM | POA: Diagnosis not present

## 2018-02-23 DIAGNOSIS — N2581 Secondary hyperparathyroidism of renal origin: Secondary | ICD-10-CM | POA: Diagnosis not present

## 2018-02-23 DIAGNOSIS — D509 Iron deficiency anemia, unspecified: Secondary | ICD-10-CM | POA: Diagnosis not present

## 2018-02-24 DIAGNOSIS — Z992 Dependence on renal dialysis: Secondary | ICD-10-CM | POA: Diagnosis not present

## 2018-02-24 DIAGNOSIS — D509 Iron deficiency anemia, unspecified: Secondary | ICD-10-CM | POA: Diagnosis not present

## 2018-02-24 DIAGNOSIS — N2581 Secondary hyperparathyroidism of renal origin: Secondary | ICD-10-CM | POA: Diagnosis not present

## 2018-02-24 DIAGNOSIS — N186 End stage renal disease: Secondary | ICD-10-CM | POA: Diagnosis not present

## 2018-02-27 DIAGNOSIS — D509 Iron deficiency anemia, unspecified: Secondary | ICD-10-CM | POA: Diagnosis not present

## 2018-02-27 DIAGNOSIS — N2581 Secondary hyperparathyroidism of renal origin: Secondary | ICD-10-CM | POA: Diagnosis not present

## 2018-02-27 DIAGNOSIS — N186 End stage renal disease: Secondary | ICD-10-CM | POA: Diagnosis not present

## 2018-02-27 DIAGNOSIS — Z992 Dependence on renal dialysis: Secondary | ICD-10-CM | POA: Diagnosis not present

## 2018-03-01 DIAGNOSIS — D509 Iron deficiency anemia, unspecified: Secondary | ICD-10-CM | POA: Diagnosis not present

## 2018-03-01 DIAGNOSIS — N2581 Secondary hyperparathyroidism of renal origin: Secondary | ICD-10-CM | POA: Diagnosis not present

## 2018-03-01 DIAGNOSIS — N186 End stage renal disease: Secondary | ICD-10-CM | POA: Diagnosis not present

## 2018-03-01 DIAGNOSIS — Z992 Dependence on renal dialysis: Secondary | ICD-10-CM | POA: Diagnosis not present

## 2018-03-03 DIAGNOSIS — N186 End stage renal disease: Secondary | ICD-10-CM | POA: Diagnosis not present

## 2018-03-03 DIAGNOSIS — Z992 Dependence on renal dialysis: Secondary | ICD-10-CM | POA: Diagnosis not present

## 2018-03-03 DIAGNOSIS — N2581 Secondary hyperparathyroidism of renal origin: Secondary | ICD-10-CM | POA: Diagnosis not present

## 2018-03-03 DIAGNOSIS — D509 Iron deficiency anemia, unspecified: Secondary | ICD-10-CM | POA: Diagnosis not present

## 2018-03-04 ENCOUNTER — Encounter (HOSPITAL_COMMUNITY): Payer: Self-pay

## 2018-03-05 ENCOUNTER — Other Ambulatory Visit: Payer: Self-pay

## 2018-03-05 ENCOUNTER — Encounter: Payer: Self-pay | Admitting: Family

## 2018-03-05 ENCOUNTER — Other Ambulatory Visit: Payer: Self-pay | Admitting: Vascular Surgery

## 2018-03-05 ENCOUNTER — Encounter: Payer: Self-pay | Admitting: Vascular Surgery

## 2018-03-05 ENCOUNTER — Ambulatory Visit (INDEPENDENT_AMBULATORY_CARE_PROVIDER_SITE_OTHER): Payer: Self-pay | Admitting: Physician Assistant

## 2018-03-05 ENCOUNTER — Ambulatory Visit (HOSPITAL_COMMUNITY)
Admission: RE | Admit: 2018-03-05 | Discharge: 2018-03-05 | Disposition: A | Discharge status: Home / Self Care | Payer: Medicare Other | Source: Ambulatory Visit | Attending: Vascular Surgery | Admitting: Vascular Surgery

## 2018-03-05 VITALS — BP 117/84 | HR 108 | Temp 98.7°F | Resp 18 | Ht 71.0 in | Wt 262.0 lb

## 2018-03-05 DIAGNOSIS — N186 End stage renal disease: Secondary | ICD-10-CM

## 2018-03-05 DIAGNOSIS — Z992 Dependence on renal dialysis: Secondary | ICD-10-CM | POA: Diagnosis not present

## 2018-03-05 DIAGNOSIS — N2581 Secondary hyperparathyroidism of renal origin: Secondary | ICD-10-CM | POA: Diagnosis not present

## 2018-03-05 DIAGNOSIS — D509 Iron deficiency anemia, unspecified: Secondary | ICD-10-CM | POA: Diagnosis not present

## 2018-03-05 NOTE — Progress Notes (Signed)
    Postoperative Access Visit   History of Present Illness   Jack Irwin is a 53 y.o. year old male who presents for postoperative follow-up for: right brachiocephalic arteriovenous fistula by Dr. Oneida Alar (Date: 12/15/17).  The patient's wounds are well healed.  The patient denies steal symptoms.  The patient is able to complete their activities of daily living.  He is currently dialyzing via R IJ TDC.  Prior dialysis access includes failed fistula L arm, occluded L upper arm graft, occluded R upper arm graft.  He is on Eliquis for history of DVT.  He denies chest pain or SOB.   Physical Examination   Vitals:   03/05/18 1336  BP: 117/84  Pulse: (!) 108  Resp: 18  Temp: 98.7 F (37.1 C)  TempSrc: Oral  SpO2: 96%  Weight: 262 lb (118.8 kg)  Height: 5\' 11"  (1.803 m)   Body mass index is 36.54 kg/m.  right arm Incision is healed, hand grip is 5/5, sensation in digits is intact, thrombosed R arm AVF     Medical Decision Making   MAUDE GLOOR is a 53 y.o. year old male who presents s/p right brachiocephalic arteriovenous fistula   Thrombosed R arm AVF  Patient has now failed fistula and upper arm grafts on B UE  Check B UE venogram for potential future access and rule out central venous occlusion  Patient would prefer Dr. Oneida Alar performs his venograms  Risks, benefits were discussed with the patient and he agrees to proceed   Dagoberto Ligas PA-C [facility] Office: [phone]

## 2018-03-08 DIAGNOSIS — N186 End stage renal disease: Secondary | ICD-10-CM | POA: Diagnosis not present

## 2018-03-08 DIAGNOSIS — R112 Nausea with vomiting, unspecified: Secondary | ICD-10-CM | POA: Diagnosis not present

## 2018-03-08 DIAGNOSIS — Z992 Dependence on renal dialysis: Secondary | ICD-10-CM | POA: Diagnosis not present

## 2018-03-08 DIAGNOSIS — N2581 Secondary hyperparathyroidism of renal origin: Secondary | ICD-10-CM | POA: Diagnosis not present

## 2018-03-08 DIAGNOSIS — D509 Iron deficiency anemia, unspecified: Secondary | ICD-10-CM | POA: Diagnosis not present

## 2018-03-10 ENCOUNTER — Encounter: Payer: Self-pay | Admitting: Family Medicine

## 2018-03-10 ENCOUNTER — Ambulatory Visit (INDEPENDENT_AMBULATORY_CARE_PROVIDER_SITE_OTHER): Payer: Medicare Other | Admitting: Family Medicine

## 2018-03-10 VITALS — BP 131/74 | HR 73 | Temp 97.4°F | Resp 16 | Ht 71.0 in | Wt 260.0 lb

## 2018-03-10 DIAGNOSIS — E1122 Type 2 diabetes mellitus with diabetic chronic kidney disease: Secondary | ICD-10-CM

## 2018-03-10 DIAGNOSIS — N186 End stage renal disease: Secondary | ICD-10-CM | POA: Diagnosis not present

## 2018-03-10 DIAGNOSIS — N492 Inflammatory disorders of scrotum: Secondary | ICD-10-CM

## 2018-03-10 DIAGNOSIS — Z992 Dependence on renal dialysis: Secondary | ICD-10-CM

## 2018-03-10 DIAGNOSIS — K0889 Other specified disorders of teeth and supporting structures: Secondary | ICD-10-CM

## 2018-03-10 LAB — POCT GLYCOSYLATED HEMOGLOBIN (HGB A1C): Hemoglobin A1C: 5 % (ref 4.0–5.6)

## 2018-03-10 MED ORDER — AMMONIUM LACTATE 12 % EX LOTN: 1.0000 "application " | TOPICAL_LOTION | CUTANEOUS | 0 refills | Status: AC | PRN

## 2018-03-10 MED ORDER — AMOXICILLIN-POT CLAVULANATE 875-125 MG PO TABS: 1.0000 | ORAL_TABLET | Freq: Two times a day (BID) | ORAL | 0 refills | Status: DC

## 2018-03-10 NOTE — Progress Notes (Signed)
Patient [facility]   Progress Note: General Provider: Lanae Boast, FNP  SUBJECTIVE:   Jack Irwin is a 53 y.o. male who  has a past medical history of Anemia, Arthritis, BPH (benign prostatic hyperplasia), Chronic back pain, Difficulty walking, DVT (deep venous thrombosis) ([place]), End-stage renal disease on hemodialysis ([place]), Erectile dysfunction, Essential hypertension, Gout, Neuropathy, diabetic ([place]), Type 2 diabetes mellitus ([place]), and Venous (peripheral) insufficiency.. Patient presents today for Recurrent Skin Infections (on upper thigh ) Patient presents today with multiple complaints.  He states that he has had tooth pain for 1 week due to a broken tooth.  He has not taken anything for this.  He also complains of a boil that occurs in the scrotum that waxes and wanes over the past few months.  He states that these are recurrent boils and daily use blood and pus.  He also complains of dry skin to the feet with thick started toenails.  He would like a referral to podiatry.  Patient denies fevers chills or night sweats.  He denies taking medications for the previously mentioned problems.  Patient reports that he continues to do hemodialysis 3 times a week.  He does not eat a heart healthy diet.  He does not exercise on a daily basis. Review of Systems  Constitutional: Negative.   HENT:       Tooth pain  Eyes: Negative.   Respiratory: Negative.   Cardiovascular: Negative.   Gastrointestinal: Negative.   Genitourinary: Negative.   Musculoskeletal: Negative.   Skin: Positive for rash.  Neurological: Negative.   Psychiatric/Behavioral: Negative.      OBJECTIVE: BP 131/74 (BP Location: Left Arm, Patient Position: Sitting, Cuff Size: Large)   Pulse 73   Temp (!) 97.4 F (36.3 C) (Oral)   Resp 16   Ht 5\' 11"  (1.803 m)   Wt 260 lb (117.9 kg)   SpO2 99%   BMI 36.26 kg/m   Wt Readings from Last 3 Encounters:  03/10/18 260 lb (117.9  kg)  03/05/18 262 lb (118.8 kg)  12/15/17 262 lb (118.8 kg)     Physical Exam Vitals signs and nursing note reviewed. Exam conducted with a chaperone present.  Constitutional:      General: He is not in acute distress.    Appearance: He is well-developed.  HENT:     Head: Normocephalic and atraumatic.     Mouth/Throat:     Dentition: Dental tenderness, gingival swelling, dental caries and gum lesions present.  Eyes:     Conjunctiva/sclera: Conjunctivae normal.     Pupils: Pupils are equal, round, and reactive to light.  Neck:     Musculoskeletal: Normal range of motion.  Cardiovascular:     Rate and Rhythm: Normal rate and regular rhythm.     Heart sounds: Normal heart sounds.  Pulmonary:     Effort: Pulmonary effort is normal. No respiratory distress.     Breath sounds: Normal breath sounds.  Abdominal:     General: Bowel sounds are normal. There is no distension.     Palpations: Abdomen is soft.  Musculoskeletal: Normal range of motion.  Skin:    General: Skin is warm and dry.     Findings: Lesion present.          Comments: Patient with dry skin noted to bilateral feet.  The toenails are thickened and discolored.  Toenails also misshapen.  Neurological:     Mental Status: He is alert and oriented  to person, place, and time.  Psychiatric:        Behavior: Behavior normal.        Thought Content: Thought content normal.                ASSESSMENT/PLAN:   2. ESRD on dialysis [facility]) Continue with dialysis 3 times a week.  Patient is followed by [facility] for this.  3. Type 2 diabetes mellitus with chronic kidney disease on chronic dialysis, without long-term current use of insulin ([place]) Patient to continue with current medications.  Referral to podiatry placed. - HgB A1c - Ambulatory referral to Podiatry - ammonium lactate (AMLACTIN) 12 % lotion; Apply 1 application topically as needed for dry skin.  Dispense: 400 g; Refill: 0  4. Scrotum,  abscess Instructed patient to keep the area clean and dry.  Started on Augmentin twice a day for 10 days.  Discussed hygiene practices. - amoxicillin-clavulanate (AUGMENTIN) 875-125 MG tablet; Take 1 tablet by mouth 2 (two) times daily.  Dispense: 20 tablet; Refill: 0  5. Tooth pain Patient advised to make appointment with dentist prior to completing antibiotic therapy.  Discussed oral hygiene. - amoxicillin-clavulanate (AUGMENTIN) 875-125 MG tablet; Take 1 tablet by mouth 2 (two) times daily.  Dispense: 20 tablet; Refill: 0    Return in about 3 months (around 06/08/2018), or if symptoms worsen or fail to improve.    The patient was given clear instructions to go to ER or return to medical center if symptoms do not improve, worsen or new problems develop. The patient verbalized understanding and agreed with plan of care.   Ms. Doug Sou. Nathaneil Canary, FNP-BC [facility] [address] [place], [place] [address] [phone]

## 2018-03-10 NOTE — Patient Instructions (Signed)
Dialysis Dialysis is a procedure that is done when the kidneys have stopped working properly (kidney failure). It may also be done earlier if it may help improve symptoms. During dialysis, wastes, salt, and extra water are removed from the blood, and the levels of certain minerals in the blood are maintained. Dialysis is done in sessions which are continued until the kidneys get better. If the kidneys cannot get better, such as in end-stage kidney disease, dialysis is continued for life or until you receive a new kidney from a donor (kidney transplant). There are two types of dialysis: hemodialysis and peritoneal dialysis. What is hemodialysis?        Hemodialysis is when a machine called a dialyzer is used to filter the blood. Before starting hemodialysis, you will have surgery to create a site where blood can be removed from the body and returned to the body (vascular access). There are three types of vascular accesses:  Arteriovenous fistula. This type of access is created when an artery and a vein (usually in the arm) are connected during surgery. The arteriovenous fistula usually takes 1-6 months to develop after surgery. It may last longer than the other types of vascular accesses and is less likely to become infected or cause blood clots.  Arteriovenous graft. This type of access is created when an artery and a vein in the arm are connected during surgery with a tube. An arteriovenous graft can usually be used within 2-3 weeks of surgery.  A venous catheter. To create this type of access, a thin tube (catheter) is placed in a large vein in your neck, chest, or groin. A venous catheter can be used right away. It is usually used as a temporary access when dialysis needs to begin immediately. During hemodialysis, blood leaves your body through your access site. It travels through a tube to the dialyzer, where it is filtered. The blood then returns to your body through another tube. Hemodialysis  is usually done at a hospital or dialysis center three times a week. Visits last about 3-5 hours. With special training, it may also be done at home with the help of another person. What is peritoneal dialysis? Peritoneal dialysis is when the thin lining of the abdomen (peritoneum) and a fluid called dialysate are used to filter the blood. Before starting peritoneal dialysis, you will have surgery to place a catheter in your abdomen. The catheter will be used to transfer dialysate to and from your abdomen. At the start of a session, your abdomen is filled with dialysate. During the session, wastes, salt, and extra water in the blood pass through the peritoneum and into the dialysate. The dialysate is drained from the body at the end of the session. The process of filling and draining the dialysate is called an exchange. Exchanges are repeated until you have used up all the dialysate for the day. You may do peritoneal dialysis at home or at almost any other location. It is done every day. You may need up to five exchanges a day. Each exchange takes about 30-40 minutes. The amount of time the dialysate is in your body between exchanges is called a dwell. The dwell usually lasts 1.5-3 hours and can vary with each person. You may choose to do exchanges at night while you sleep, using a machine called a cycler. Which type of dialysis should I choose? Both types of dialysis have advantages and disadvantages. Talk with your health care provider about which type of dialysis is best  for you. Your lifestyle, preferences, and medical condition should be considered. In some cases, only one type of dialysis can be chosen. Advantages of hemodialysis  It is done less often than peritoneal dialysis.  Someone else can do the dialysis for you.  If you go to a dialysis center: ? Your health care provider can recognize any problems you may be having. ? You can interact with others who are having dialysis. This can  provide you with emotional support. Disadvantages of hemodialysis  Hemodialysis may cause cramps and low blood pressure. It may leave you feeling tired on the days you have the treatment.  If you go to a dialysis center, you will need to make weekly appointments and work around the center's schedule.  You will need to take extra care when traveling. If you usually get treatment in a dialysis center, you will need to arrange to visit a dialysis center near your destination. If you are having treatments at home, you will need to take the dialyzer with you when traveling.  There are more eating restrictions than with peritoneal dialysis. Advantages of peritoneal dialysis  It is less likely than hemodialysis to cause cramps and low blood pressure.  There are fewer eating restrictions than with hemodialysis.  You may do exchanges on your own wherever you are, including when you travel. Disadvantages of peritoneal dialysis  It is done more often than hemodialysis.  Doing peritoneal dialysis requires you to have a good use (dexterity) of your hands. You must also be able to lift bags.  You must learn how to make your equipment free of germs (sterilization techniques). You will need to use these techniques every day to prevent infection. What changes will I need to make to my diet during dialysis? Both types of dialysis require you to make some changes to your diet. For example, you will need to limit your intake of foods that contain a lot of phosphorus and potassium. You will also need to limit your fluid intake. A diet and nutrition specialist (dietitian) can help you make a meal plan that can help improve your dialysis and your health. What should I expect when starting dialysis? Adjusting to the dialysis treatment, schedule, and diet can take some time. You may need to stop working and may not be able to do some of your normal activities. You may feel anxious or depressed when starting  dialysis. Over time, many people feel better overall because of dialysis. You may be able to return to work after making some changes, such as reducing work intensity. Where to find more information  [place]: www.kidney.org  American Association of Kidney Patients: BombTimer.gl  American Kid[employer] Fund: www.kidneyfund.org Summary  During dialysis, wastes, salt, and extra water are removed from the blood, and the levels of certain minerals in the blood are maintained. There are two types of dialysis: hemodialysis and peritoneal dialysis.  Hemodialysis is when a machine called a dialyzer is used to filter the blood.  Hemodialysis is usually done by a health care provider at a hospital or dialysis center three times a week.  Peritoneal dialysis is when the peritoneum is used as a filter. You may do peritoneal dialysis at home or at almost any other location.  Both types of dialysis have advantages and disadvantages. Talk with your health care provider about which type of dialysis is best for you. This information is not intended to replace advice given to you by your health care provider. Make sure you  discuss any questions you have with your health care provider. Document Released: 04/12/2002 Document Revised: 03/18/2016 Document Reviewed: 03/18/2016 Elsevier Interactive Patient Education  2019 [employer].

## 2018-03-11 ENCOUNTER — Ambulatory Visit: Payer: Self-pay | Admitting: Family Medicine

## 2018-03-12 DIAGNOSIS — R112 Nausea with vomiting, unspecified: Secondary | ICD-10-CM | POA: Diagnosis not present

## 2018-03-12 DIAGNOSIS — Z992 Dependence on renal dialysis: Secondary | ICD-10-CM | POA: Diagnosis not present

## 2018-03-12 DIAGNOSIS — D509 Iron deficiency anemia, unspecified: Secondary | ICD-10-CM | POA: Diagnosis not present

## 2018-03-12 DIAGNOSIS — N2581 Secondary hyperparathyroidism of renal origin: Secondary | ICD-10-CM | POA: Diagnosis not present

## 2018-03-12 DIAGNOSIS — N186 End stage renal disease: Secondary | ICD-10-CM | POA: Diagnosis not present

## 2018-03-15 DIAGNOSIS — Z992 Dependence on renal dialysis: Secondary | ICD-10-CM | POA: Diagnosis not present

## 2018-03-15 DIAGNOSIS — N2581 Secondary hyperparathyroidism of renal origin: Secondary | ICD-10-CM | POA: Diagnosis not present

## 2018-03-15 DIAGNOSIS — D509 Iron deficiency anemia, unspecified: Secondary | ICD-10-CM | POA: Diagnosis not present

## 2018-03-15 DIAGNOSIS — N186 End stage renal disease: Secondary | ICD-10-CM | POA: Diagnosis not present

## 2018-03-15 DIAGNOSIS — R112 Nausea with vomiting, unspecified: Secondary | ICD-10-CM | POA: Diagnosis not present

## 2018-03-17 DIAGNOSIS — D509 Iron deficiency anemia, unspecified: Secondary | ICD-10-CM | POA: Diagnosis not present

## 2018-03-17 DIAGNOSIS — R112 Nausea with vomiting, unspecified: Secondary | ICD-10-CM | POA: Diagnosis not present

## 2018-03-17 DIAGNOSIS — N2581 Secondary hyperparathyroidism of renal origin: Secondary | ICD-10-CM | POA: Diagnosis not present

## 2018-03-17 DIAGNOSIS — Z992 Dependence on renal dialysis: Secondary | ICD-10-CM | POA: Diagnosis not present

## 2018-03-17 DIAGNOSIS — N186 End stage renal disease: Secondary | ICD-10-CM | POA: Diagnosis not present

## 2018-03-18 DIAGNOSIS — D509 Iron deficiency anemia, unspecified: Secondary | ICD-10-CM | POA: Diagnosis not present

## 2018-03-18 DIAGNOSIS — R112 Nausea with vomiting, unspecified: Secondary | ICD-10-CM | POA: Diagnosis not present

## 2018-03-18 DIAGNOSIS — Z992 Dependence on renal dialysis: Secondary | ICD-10-CM | POA: Diagnosis not present

## 2018-03-18 DIAGNOSIS — N2581 Secondary hyperparathyroidism of renal origin: Secondary | ICD-10-CM | POA: Diagnosis not present

## 2018-03-18 DIAGNOSIS — N186 End stage renal disease: Secondary | ICD-10-CM | POA: Diagnosis not present

## 2018-03-22 DIAGNOSIS — D509 Iron deficiency anemia, unspecified: Secondary | ICD-10-CM | POA: Diagnosis not present

## 2018-03-22 DIAGNOSIS — R112 Nausea with vomiting, unspecified: Secondary | ICD-10-CM | POA: Diagnosis not present

## 2018-03-22 DIAGNOSIS — N186 End stage renal disease: Secondary | ICD-10-CM | POA: Diagnosis not present

## 2018-03-22 DIAGNOSIS — Z992 Dependence on renal dialysis: Secondary | ICD-10-CM | POA: Diagnosis not present

## 2018-03-22 DIAGNOSIS — N2581 Secondary hyperparathyroidism of renal origin: Secondary | ICD-10-CM | POA: Diagnosis not present

## 2018-03-24 DIAGNOSIS — N2581 Secondary hyperparathyroidism of renal origin: Secondary | ICD-10-CM | POA: Diagnosis not present

## 2018-03-24 DIAGNOSIS — Z992 Dependence on renal dialysis: Secondary | ICD-10-CM | POA: Diagnosis not present

## 2018-03-24 DIAGNOSIS — R112 Nausea with vomiting, unspecified: Secondary | ICD-10-CM | POA: Diagnosis not present

## 2018-03-24 DIAGNOSIS — D509 Iron deficiency anemia, unspecified: Secondary | ICD-10-CM | POA: Diagnosis not present

## 2018-03-24 DIAGNOSIS — N186 End stage renal disease: Secondary | ICD-10-CM | POA: Diagnosis not present

## 2018-03-25 ENCOUNTER — Encounter: Payer: Self-pay | Admitting: *Deleted

## 2018-03-25 ENCOUNTER — Telehealth: Payer: Self-pay | Admitting: *Deleted

## 2018-03-25 NOTE — Telephone Encounter (Signed)
Phone call to patient to reschedule procedure due to weather. Venogram rescheduled for 04/02/2018. To be at [facility] admitting department at 5:30 am. NPO past MN. To continue Eliquis per Dr. Oneida Alar and patient is going to change HD days to accommodate this procedure on a Friday. Must have a driver for home. Letter of instructions faxed to [facility].

## 2018-03-26 DIAGNOSIS — Z992 Dependence on renal dialysis: Secondary | ICD-10-CM | POA: Diagnosis not present

## 2018-03-26 DIAGNOSIS — D509 Iron deficiency anemia, unspecified: Secondary | ICD-10-CM | POA: Diagnosis not present

## 2018-03-26 DIAGNOSIS — N186 End stage renal disease: Secondary | ICD-10-CM | POA: Diagnosis not present

## 2018-03-26 DIAGNOSIS — N2581 Secondary hyperparathyroidism of renal origin: Secondary | ICD-10-CM | POA: Diagnosis not present

## 2018-03-26 DIAGNOSIS — R112 Nausea with vomiting, unspecified: Secondary | ICD-10-CM | POA: Diagnosis not present

## 2018-03-29 DIAGNOSIS — R112 Nausea with vomiting, unspecified: Secondary | ICD-10-CM | POA: Diagnosis not present

## 2018-03-29 DIAGNOSIS — N186 End stage renal disease: Secondary | ICD-10-CM | POA: Diagnosis not present

## 2018-03-29 DIAGNOSIS — D509 Iron deficiency anemia, unspecified: Secondary | ICD-10-CM | POA: Diagnosis not present

## 2018-03-29 DIAGNOSIS — N2581 Secondary hyperparathyroidism of renal origin: Secondary | ICD-10-CM | POA: Diagnosis not present

## 2018-03-29 DIAGNOSIS — Z992 Dependence on renal dialysis: Secondary | ICD-10-CM | POA: Diagnosis not present

## 2018-03-30 ENCOUNTER — Ambulatory Visit: Payer: Medicare Other | Admitting: Sports Medicine

## 2018-03-31 DIAGNOSIS — R112 Nausea with vomiting, unspecified: Secondary | ICD-10-CM | POA: Diagnosis not present

## 2018-03-31 DIAGNOSIS — Z992 Dependence on renal dialysis: Secondary | ICD-10-CM | POA: Diagnosis not present

## 2018-03-31 DIAGNOSIS — N186 End stage renal disease: Secondary | ICD-10-CM | POA: Diagnosis not present

## 2018-03-31 DIAGNOSIS — N2581 Secondary hyperparathyroidism of renal origin: Secondary | ICD-10-CM | POA: Diagnosis not present

## 2018-03-31 DIAGNOSIS — D509 Iron deficiency anemia, unspecified: Secondary | ICD-10-CM | POA: Diagnosis not present

## 2018-04-01 ENCOUNTER — Other Ambulatory Visit: Payer: Self-pay | Admitting: *Deleted

## 2018-04-01 NOTE — Progress Notes (Signed)
Rescheduled per patient for 04/08/2018 with Dr. Carlis Abbott. Instructed to be at [facility] admitting at 5:30 am. All other instructions unchanged per pre-procedure letter.

## 2018-04-02 DIAGNOSIS — R112 Nausea with vomiting, unspecified: Secondary | ICD-10-CM | POA: Diagnosis not present

## 2018-04-02 DIAGNOSIS — D509 Iron deficiency anemia, unspecified: Secondary | ICD-10-CM | POA: Diagnosis not present

## 2018-04-02 DIAGNOSIS — Z992 Dependence on renal dialysis: Secondary | ICD-10-CM | POA: Diagnosis not present

## 2018-04-02 DIAGNOSIS — N186 End stage renal disease: Secondary | ICD-10-CM | POA: Diagnosis not present

## 2018-04-02 DIAGNOSIS — N2581 Secondary hyperparathyroidism of renal origin: Secondary | ICD-10-CM | POA: Diagnosis not present

## 2018-04-03 DIAGNOSIS — Z992 Dependence on renal dialysis: Secondary | ICD-10-CM | POA: Diagnosis not present

## 2018-04-03 DIAGNOSIS — N186 End stage renal disease: Secondary | ICD-10-CM | POA: Diagnosis not present

## 2018-04-05 DIAGNOSIS — D509 Iron deficiency anemia, unspecified: Secondary | ICD-10-CM | POA: Diagnosis not present

## 2018-04-05 DIAGNOSIS — Z992 Dependence on renal dialysis: Secondary | ICD-10-CM | POA: Diagnosis not present

## 2018-04-05 DIAGNOSIS — N186 End stage renal disease: Secondary | ICD-10-CM | POA: Diagnosis not present

## 2018-04-05 DIAGNOSIS — N2581 Secondary hyperparathyroidism of renal origin: Secondary | ICD-10-CM | POA: Diagnosis not present

## 2018-04-05 DIAGNOSIS — R112 Nausea with vomiting, unspecified: Secondary | ICD-10-CM | POA: Diagnosis not present

## 2018-04-07 ENCOUNTER — Ambulatory Visit: Payer: Medicare Other | Admitting: Podiatrist

## 2018-04-07 DIAGNOSIS — D509 Iron deficiency anemia, unspecified: Secondary | ICD-10-CM | POA: Diagnosis not present

## 2018-04-07 DIAGNOSIS — R112 Nausea with vomiting, unspecified: Secondary | ICD-10-CM | POA: Diagnosis not present

## 2018-04-07 DIAGNOSIS — N186 End stage renal disease: Secondary | ICD-10-CM | POA: Diagnosis not present

## 2018-04-07 DIAGNOSIS — Z992 Dependence on renal dialysis: Secondary | ICD-10-CM | POA: Diagnosis not present

## 2018-04-07 DIAGNOSIS — N2581 Secondary hyperparathyroidism of renal origin: Secondary | ICD-10-CM | POA: Diagnosis not present

## 2018-04-08 ENCOUNTER — Ambulatory Visit (HOSPITAL_COMMUNITY)
Admission: RE | Admit: 2018-04-08 | Discharge: 2018-04-08 | Disposition: A | Discharge status: Home / Self Care | Payer: Medicare Other | Attending: Vascular Surgery | Admitting: Vascular Surgery

## 2018-04-08 ENCOUNTER — Encounter (HOSPITAL_COMMUNITY)
Admission: RE | Disposition: A | Discharge status: Home / Self Care | Payer: Self-pay | Source: Home / Self Care | Attending: Vascular Surgery

## 2018-04-08 ENCOUNTER — Other Ambulatory Visit: Payer: Self-pay

## 2018-04-08 ENCOUNTER — Encounter (HOSPITAL_COMMUNITY): Payer: Self-pay | Admitting: Vascular Surgery

## 2018-04-08 DIAGNOSIS — Z7901 Long term (current) use of anticoagulants: Secondary | ICD-10-CM | POA: Diagnosis not present

## 2018-04-08 DIAGNOSIS — Z992 Dependence on renal dialysis: Secondary | ICD-10-CM | POA: Diagnosis not present

## 2018-04-08 DIAGNOSIS — T82868A Thrombosis of vascular prosthetic devices, implants and grafts, initial encounter: Secondary | ICD-10-CM | POA: Diagnosis not present

## 2018-04-08 DIAGNOSIS — N186 End stage renal disease: Secondary | ICD-10-CM | POA: Diagnosis not present

## 2018-04-08 DIAGNOSIS — Z86718 Personal history of other venous thrombosis and embolism: Secondary | ICD-10-CM | POA: Insufficient documentation

## 2018-04-08 DIAGNOSIS — N185 Chronic kidney disease, stage 5: Secondary | ICD-10-CM | POA: Diagnosis not present

## 2018-04-08 DIAGNOSIS — Y841 Kidney dialysis as the cause of abnormal reaction of the patient, or of later complication, without mention of misadventure at the time of the procedure: Secondary | ICD-10-CM | POA: Insufficient documentation

## 2018-04-08 HISTORY — PX: UPPER EXTREMITY VENOGRAPHY: CATH118272

## 2018-04-08 LAB — POCT I-STAT 4, (NA,K, GLUC, HGB,HCT)
Glucose, Bld: 82 mg/dL (ref 70–99)
HCT: 41 % (ref 39.0–52.0)
Hemoglobin: 13.9 g/dL (ref 13.0–17.0)
Potassium: 4.4 mmol/L (ref 3.5–5.1)
Sodium: 137 mmol/L (ref 135–145)

## 2018-04-08 SURGERY — UPPER EXTREMITY VENOGRAPHY
Anesthesia: LOCAL | Laterality: Left

## 2018-04-08 MED ORDER — SODIUM CHLORIDE 0.9% FLUSH: 3.0000 mL | Freq: Two times a day (BID) | INTRAVENOUS | Status: DC

## 2018-04-08 MED ORDER — IODIXANOL 320 MG/ML IV SOLN
INTRAVENOUS | Status: DC | PRN
  Administered 2018-04-08: 50 mL via INTRAVENOUS

## 2018-04-08 MED ORDER — SODIUM CHLORIDE 0.9% FLUSH: 3.0000 mL | INTRAVENOUS | Status: DC | PRN

## 2018-04-08 MED ORDER — SODIUM CHLORIDE 0.9 % IV SOLN: 250.0000 mL | INTRAVENOUS | Status: DC | PRN

## 2018-04-08 SURGICAL SUPPLY — 2 items
STOPCOCK MORSE 400PSI 3WAY (MISCELLANEOUS) ×1 IMPLANT
TUBING CIL FLEX 10 FLL-RA (TUBING) ×1 IMPLANT

## 2018-04-08 NOTE — Discharge Instructions (Signed)
Venogram, Care After This sheet gives you information about how to care for yourself after your procedure. Your health care provider may also give you more specific instructions. If you have problems or questions, contact your health care provider. What can I expect after the procedure? After the procedure, it is common to have:  Bruising or mild discomfort in the area where the IV was inserted (insertion site). Follow these instructions at home: Eating and drinking   Follow instructions from your health care provider about eating or drinking restrictions.  Drink a lot of fluids for the first several days after the procedure, as directed by your health care provider. This helps to wash (flush) the contrast out of your body. Examples of healthy fluids include water or low-calorie drinks. General instructions  Check your IV insertion area every day for signs of infection. Check for: ? Redness, swelling, or pain. ? Fluid or blood. ? Warmth. ? Pus or a bad smell.  Take over-the-counter and prescription medicines only as told by your health care provider.  Rest and return to your normal activities as told by your health care provider. Ask your health care provider what activities are safe for you.  Do not drive for 24 hours if you were given a medicine to help you relax (sedative), or until your health care provider approves.  Keep all follow-up visits as told by your health care provider. This is important. Contact a health care provider if:  Your skin becomes itchy or you develop a rash or hives.  You have a fever that does not get better with medicine.  You feel nauseous.  You vomit.  You have redness, swelling, or pain around the insertion site.  You have fluid or blood coming from the insertion site.  Your insertion area feels warm to the touch.  You have pus or a bad smell coming from the insertion site. Get help right away if:  You have difficulty breathing or  shortness of breath.  You develop chest pain.  You faint.  You feel very dizzy. These symptoms may represent a serious problem that is an emergency. Do not wait to see if the symptoms will go away. Get medical help right away. Call your local emergency services (911 in the [place] Do not drive yourself to the hospital. Summary  After your procedure, it is common to have bruising or mild discomfort in the area where the IV was inserted.  You should check your IV insertion area every day for signs of infection.  Take over-the-counter and prescription medicines only as told by your health care provider.  You should drink a lot of fluids for the first several days after the procedure to help flush the contrast from your body. This information is not intended to replace advice given to you by your health care provider. Make sure you discuss any questions you have with your health care provider. Document Released: 11/10/2012 Document Revised: 12/15/2015 Document Reviewed: 12/15/2015 Elsevier Interactive Patient Education  2019 [employer].

## 2018-04-08 NOTE — Op Note (Signed)
    Patient name: Jack Irwin MRN: [id number] DOB: Aug 18, 1965 Sex: male  04/08/2018 Pre-operative Diagnosis: Failed bilateral upper extremity access Post-operative diagnosis:  Same Surgeon:  Marty Heck, MD Procedure Performed: 1.  Left upper venogram including central venogram  Indications: Patient is a 53 year old male who has had failed bilateral upper extremity access including failed bilateral upper extremity brachiocephalic fistulas and upper arm grafts.  He presents today for bilateral upper extremity venogram to rule out any evidence of central occlusion before any additional accesses placed in the future.  He is currently dialyzing via right IJ tunnel catheter.  Risks and benefits discussed with patient.  Findings: Unfortunately no usable IV could successfully be placed in the right upper extremity after multiple attempts.  However this is the same extremity that he has a chronic indwelling right IJ tunneled catheter for current dialysis needs.  A left upper extremity venogram was obtained that showed no overt evidence of a central stenosis.  It should be noted the imaging was limited due to his body habitus and breathing during the venogram but to the best of our ability contrast did empty through the subclavian innominate and vena cava without any focal occlusion.  His left upper arm venogram shows a small basilic vein that does not appear usable but he does have a fairly robust brachial vein/axillary vein that can be used as outflow of a new graft.   Procedure:  The patient was identified in the holding area and taken to room 8.  Prior to taking him to room 8 a working IV was placed at the left wrist. Unfortunately the pre-op team and IV team could not get a working IV in the right arm.  The patient was then placed supine on the table and prepped and draped in the usual sterile fashion.  A time out was called.  We also attempted to place a right arm IV under ultrasound guidance  with no succecss.  We decided to proceed with just left arm venogram.  Contrast was used to inject his IV at the left wrist and a left central venogram as well as a left upper arm venogram were obtained.  After evaluating the images it appears the patient would benefit from a new left upper arm AV graft.  Patient tolerated the procedure without any complications taken to PACU in stable condition.     Marty Heck, MD [facility] Office: [phone] Pager: [place]

## 2018-04-08 NOTE — H&P (Signed)
History and Physical Interval Note:  04/08/2018 7:28 AM  Jack Irwin  has presented today for surgery, with the diagnosis of End stage renal  The various methods of treatment have been discussed with the patient and family. After consideration of risks, benefits and other options for treatment, the patient has consented to  Procedure(s): UPPER EXTREMITY VENOGRAPHY (Bilateral) as a surgical intervention .  The patient's history has been reviewed, patient examined, no change in status, stable for surgery.  I have reviewed the patient's chart and labs.  Questions were answered to the patient's satisfaction.    Bilateral upper extremity venogram  Marty Heck    History of Present Illness   Jack Irwin is a 53 y.o. year old male who presents for postoperative follow-up for: right brachiocephalic arteriovenous fistula by Dr. Oneida Alar (Date: 12/15/17).  The patient's wounds are well healed.  The patient denies steal symptoms.  The patient is able to complete their activities of daily living.  He is currently dialyzing via R IJ TDC.  Prior dialysis access includes failed fistula L arm, occluded L upper arm graft, occluded R upper arm graft.  He is on Eliquis for history of DVT.  He denies chest pain or SOB.   Physical Examination      Vitals:   03/05/18 1336  BP: 117/84  Pulse: (!) 108  Resp: 18  Temp: 98.7 F (37.1 C)  TempSrc: Oral  SpO2: 96%  Weight: 262 lb (118.8 kg)  Height: 5\' 11"  (1.803 m)   Body mass index is 36.54 kg/m.  right arm Incision is healed, hand grip is 5/5, sensation in digits is intact, thrombosed R arm AVF     Medical Decision Making   Jack Irwin is a 53 y.o. year old male who presents s/p right brachiocephalic arteriovenous fistula   Thrombosed R arm AVF  Patient has now failed fistula and upper arm grafts on B UE  Check B UE venogram for potential future access and rule out central venous occlusion  Patient  would prefer Dr. Oneida Alar performs his venograms  Risks, benefits were discussed with the patient and he agrees to proceed   Dagoberto Ligas PA-C [facility] Office: [phone]

## 2018-04-09 ENCOUNTER — Telehealth: Payer: Self-pay | Admitting: *Deleted

## 2018-04-09 DIAGNOSIS — R112 Nausea with vomiting, unspecified: Secondary | ICD-10-CM | POA: Diagnosis not present

## 2018-04-09 DIAGNOSIS — N186 End stage renal disease: Secondary | ICD-10-CM | POA: Diagnosis not present

## 2018-04-09 DIAGNOSIS — Z992 Dependence on renal dialysis: Secondary | ICD-10-CM | POA: Diagnosis not present

## 2018-04-09 DIAGNOSIS — D509 Iron deficiency anemia, unspecified: Secondary | ICD-10-CM | POA: Diagnosis not present

## 2018-04-09 DIAGNOSIS — N2581 Secondary hyperparathyroidism of renal origin: Secondary | ICD-10-CM | POA: Diagnosis not present

## 2018-04-09 NOTE — Telephone Encounter (Signed)
Left msg. On voice mail to call this office to schedule surgery for A/V graft/general anesthesia with Dr. Oneida Alar.

## 2018-04-09 NOTE — Telephone Encounter (Signed)
Patient returned call to this office to discuss scheduling of A/V graft with Dr. Oneida Alar. He states he is not going to have surgery if General anesthesia. I told him this decision would have to be made by the surgeon and I would speak to him next week. Patient continued to argue that he was "not going to do it". I told him that was fine I would notify the doctor and [facility]. The patient was rude, argumentative, and hung up on me as he has every time I speak with him. Spoke with Water quality scientist at [employer].

## 2018-04-12 DIAGNOSIS — N186 End stage renal disease: Secondary | ICD-10-CM | POA: Diagnosis not present

## 2018-04-12 DIAGNOSIS — Z992 Dependence on renal dialysis: Secondary | ICD-10-CM | POA: Diagnosis not present

## 2018-04-12 DIAGNOSIS — N2581 Secondary hyperparathyroidism of renal origin: Secondary | ICD-10-CM | POA: Diagnosis not present

## 2018-04-12 DIAGNOSIS — R112 Nausea with vomiting, unspecified: Secondary | ICD-10-CM | POA: Diagnosis not present

## 2018-04-12 DIAGNOSIS — D509 Iron deficiency anemia, unspecified: Secondary | ICD-10-CM | POA: Diagnosis not present

## 2018-04-14 ENCOUNTER — Ambulatory Visit: Payer: Medicare Other | Admitting: Podiatry

## 2018-04-15 ENCOUNTER — Telehealth: Payer: Self-pay | Admitting: *Deleted

## 2018-04-15 ENCOUNTER — Encounter (HOSPITAL_COMMUNITY): Payer: Self-pay | Admitting: Rehabilitation

## 2018-04-15 NOTE — Telephone Encounter (Signed)
Front office Phineas Real) has left message on voice mail x 2 for patient to call to schedule appointment to come in to speak with Dr. Oneida Alar about surgery for HD access. Spoke to [facility] to speak to patient on HD day and instruct patient to call this office and make appt.

## 2018-04-16 DIAGNOSIS — N2581 Secondary hyperparathyroidism of renal origin: Secondary | ICD-10-CM | POA: Diagnosis not present

## 2018-04-16 DIAGNOSIS — Z992 Dependence on renal dialysis: Secondary | ICD-10-CM | POA: Diagnosis not present

## 2018-04-16 DIAGNOSIS — D509 Iron deficiency anemia, unspecified: Secondary | ICD-10-CM | POA: Diagnosis not present

## 2018-04-16 DIAGNOSIS — N186 End stage renal disease: Secondary | ICD-10-CM | POA: Diagnosis not present

## 2018-04-16 DIAGNOSIS — R112 Nausea with vomiting, unspecified: Secondary | ICD-10-CM | POA: Diagnosis not present

## 2018-04-19 DIAGNOSIS — R112 Nausea with vomiting, unspecified: Secondary | ICD-10-CM | POA: Diagnosis not present

## 2018-04-19 DIAGNOSIS — N2581 Secondary hyperparathyroidism of renal origin: Secondary | ICD-10-CM | POA: Diagnosis not present

## 2018-04-19 DIAGNOSIS — Z992 Dependence on renal dialysis: Secondary | ICD-10-CM | POA: Diagnosis not present

## 2018-04-19 DIAGNOSIS — N186 End stage renal disease: Secondary | ICD-10-CM | POA: Diagnosis not present

## 2018-04-19 DIAGNOSIS — D509 Iron deficiency anemia, unspecified: Secondary | ICD-10-CM | POA: Diagnosis not present

## 2018-04-21 DIAGNOSIS — Z992 Dependence on renal dialysis: Secondary | ICD-10-CM | POA: Diagnosis not present

## 2018-04-21 DIAGNOSIS — N2581 Secondary hyperparathyroidism of renal origin: Secondary | ICD-10-CM | POA: Diagnosis not present

## 2018-04-21 DIAGNOSIS — D509 Iron deficiency anemia, unspecified: Secondary | ICD-10-CM | POA: Diagnosis not present

## 2018-04-21 DIAGNOSIS — R112 Nausea with vomiting, unspecified: Secondary | ICD-10-CM | POA: Diagnosis not present

## 2018-04-21 DIAGNOSIS — N186 End stage renal disease: Secondary | ICD-10-CM | POA: Diagnosis not present

## 2018-04-23 DIAGNOSIS — R112 Nausea with vomiting, unspecified: Secondary | ICD-10-CM | POA: Diagnosis not present

## 2018-04-23 DIAGNOSIS — N2581 Secondary hyperparathyroidism of renal origin: Secondary | ICD-10-CM | POA: Diagnosis not present

## 2018-04-23 DIAGNOSIS — Z992 Dependence on renal dialysis: Secondary | ICD-10-CM | POA: Diagnosis not present

## 2018-04-23 DIAGNOSIS — N186 End stage renal disease: Secondary | ICD-10-CM | POA: Diagnosis not present

## 2018-04-23 DIAGNOSIS — D509 Iron deficiency anemia, unspecified: Secondary | ICD-10-CM | POA: Diagnosis not present

## 2018-04-26 DIAGNOSIS — R112 Nausea with vomiting, unspecified: Secondary | ICD-10-CM | POA: Diagnosis not present

## 2018-04-26 DIAGNOSIS — N2581 Secondary hyperparathyroidism of renal origin: Secondary | ICD-10-CM | POA: Diagnosis not present

## 2018-04-26 DIAGNOSIS — D509 Iron deficiency anemia, unspecified: Secondary | ICD-10-CM | POA: Diagnosis not present

## 2018-04-26 DIAGNOSIS — Z992 Dependence on renal dialysis: Secondary | ICD-10-CM | POA: Diagnosis not present

## 2018-04-26 DIAGNOSIS — N186 End stage renal disease: Secondary | ICD-10-CM | POA: Diagnosis not present

## 2018-04-28 DIAGNOSIS — N2581 Secondary hyperparathyroidism of renal origin: Secondary | ICD-10-CM | POA: Diagnosis not present

## 2018-04-28 DIAGNOSIS — R112 Nausea with vomiting, unspecified: Secondary | ICD-10-CM | POA: Diagnosis not present

## 2018-04-28 DIAGNOSIS — D509 Iron deficiency anemia, unspecified: Secondary | ICD-10-CM | POA: Diagnosis not present

## 2018-04-28 DIAGNOSIS — N186 End stage renal disease: Secondary | ICD-10-CM | POA: Diagnosis not present

## 2018-04-28 DIAGNOSIS — Z992 Dependence on renal dialysis: Secondary | ICD-10-CM | POA: Diagnosis not present

## 2018-04-30 DIAGNOSIS — D509 Iron deficiency anemia, unspecified: Secondary | ICD-10-CM | POA: Diagnosis not present

## 2018-04-30 DIAGNOSIS — N2581 Secondary hyperparathyroidism of renal origin: Secondary | ICD-10-CM | POA: Diagnosis not present

## 2018-04-30 DIAGNOSIS — R112 Nausea with vomiting, unspecified: Secondary | ICD-10-CM | POA: Diagnosis not present

## 2018-04-30 DIAGNOSIS — Z992 Dependence on renal dialysis: Secondary | ICD-10-CM | POA: Diagnosis not present

## 2018-04-30 DIAGNOSIS — N186 End stage renal disease: Secondary | ICD-10-CM | POA: Diagnosis not present

## 2018-05-03 DIAGNOSIS — Z992 Dependence on renal dialysis: Secondary | ICD-10-CM | POA: Diagnosis not present

## 2018-05-03 DIAGNOSIS — D509 Iron deficiency anemia, unspecified: Secondary | ICD-10-CM | POA: Diagnosis not present

## 2018-05-03 DIAGNOSIS — N2581 Secondary hyperparathyroidism of renal origin: Secondary | ICD-10-CM | POA: Diagnosis not present

## 2018-05-03 DIAGNOSIS — N186 End stage renal disease: Secondary | ICD-10-CM | POA: Diagnosis not present

## 2018-05-03 DIAGNOSIS — R112 Nausea with vomiting, unspecified: Secondary | ICD-10-CM | POA: Diagnosis not present

## 2018-05-04 DIAGNOSIS — N186 End stage renal disease: Secondary | ICD-10-CM | POA: Diagnosis not present

## 2018-05-04 DIAGNOSIS — Z992 Dependence on renal dialysis: Secondary | ICD-10-CM | POA: Diagnosis not present

## 2018-05-05 ENCOUNTER — Other Ambulatory Visit: Payer: Self-pay | Admitting: Family Medicine

## 2018-05-05 DIAGNOSIS — N186 End stage renal disease: Secondary | ICD-10-CM | POA: Diagnosis not present

## 2018-05-05 DIAGNOSIS — D509 Iron deficiency anemia, unspecified: Secondary | ICD-10-CM | POA: Diagnosis not present

## 2018-05-05 DIAGNOSIS — Z7901 Long term (current) use of anticoagulants: Secondary | ICD-10-CM

## 2018-05-05 DIAGNOSIS — Z992 Dependence on renal dialysis: Secondary | ICD-10-CM | POA: Diagnosis not present

## 2018-05-05 DIAGNOSIS — N2581 Secondary hyperparathyroidism of renal origin: Secondary | ICD-10-CM | POA: Diagnosis not present

## 2018-05-05 IMAGING — US US RENAL
1 series · 14 of 25 positions shown · non-contrast
Comparison: 04/03/2017

CLINICAL DATA: Follow-up kidney cyst

EXAM:
RENAL / URINARY TRACT ULTRASOUND COMPLETE

[Series 1: us renal · 0.25mm/px · 14 of 36 slices shown]
[im 1/36]
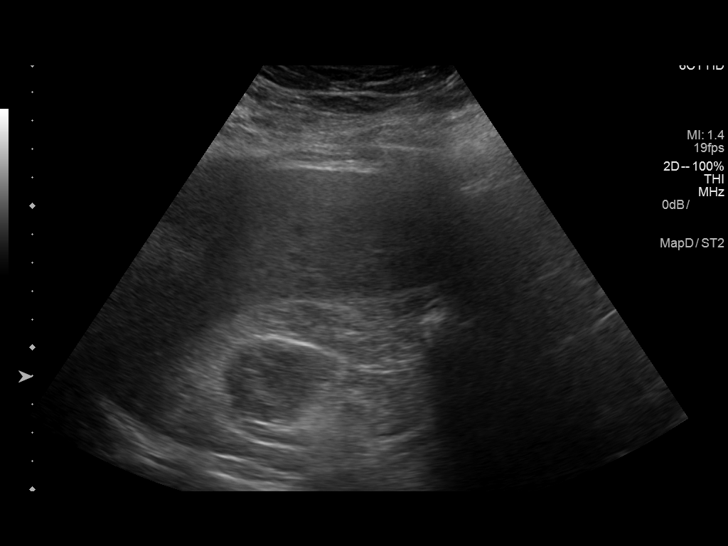
[im 3/36]
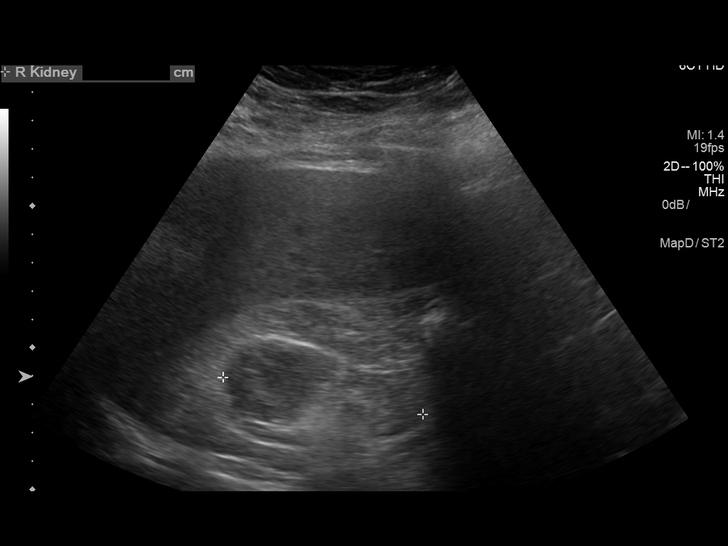
[im 6/36]
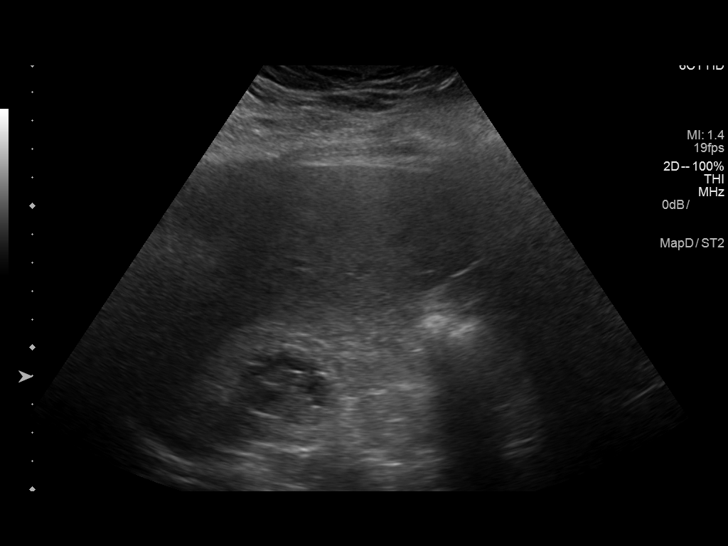
[im 9/36]
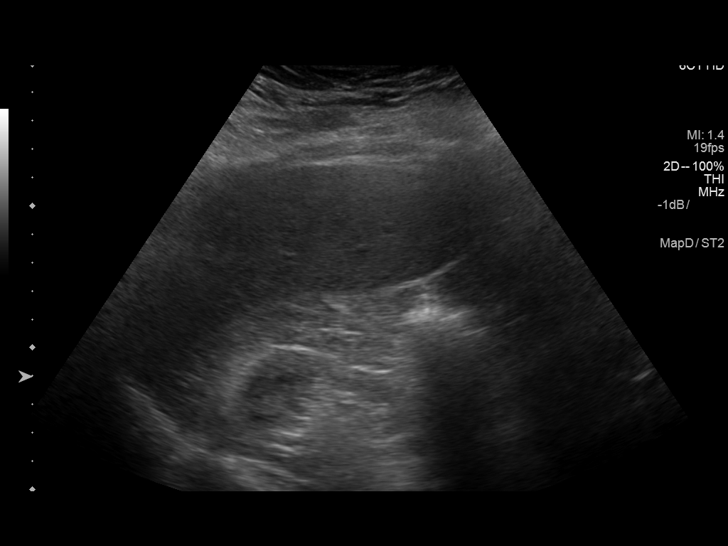
[im 12/36]
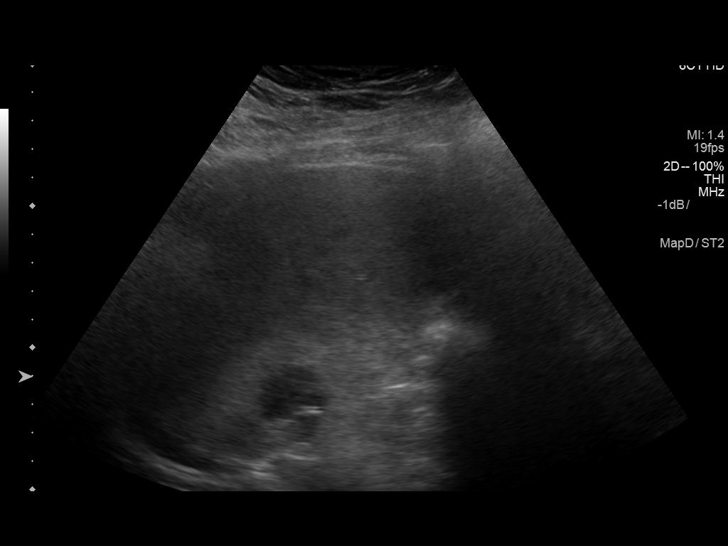
[im 14/36]
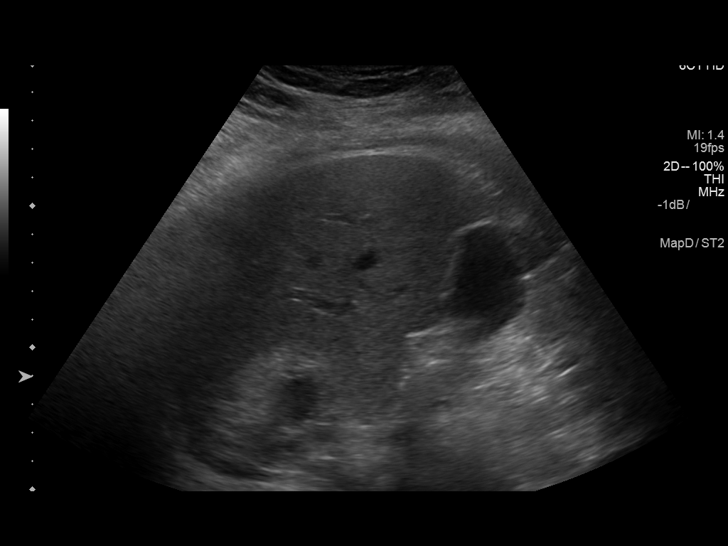
[im 17/36]
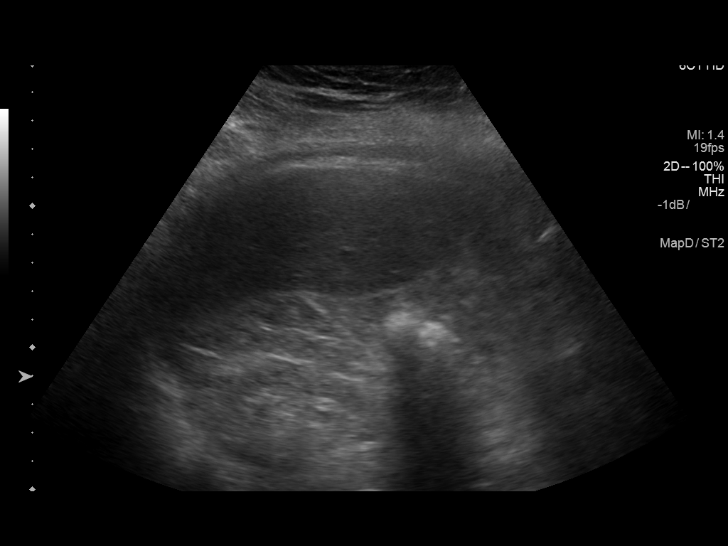
[im 19/36]
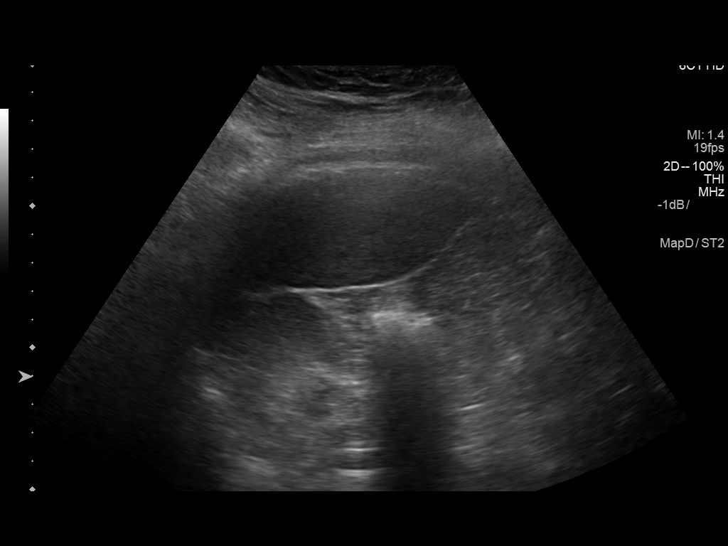
[im 22/36]
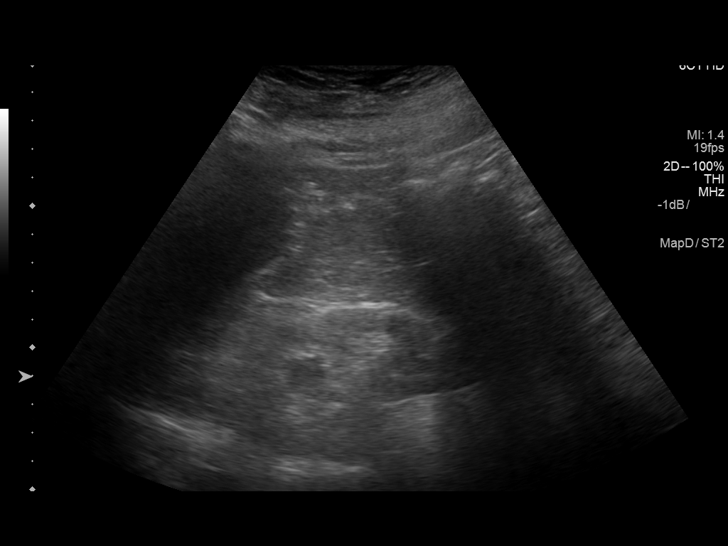
[im 24/36]
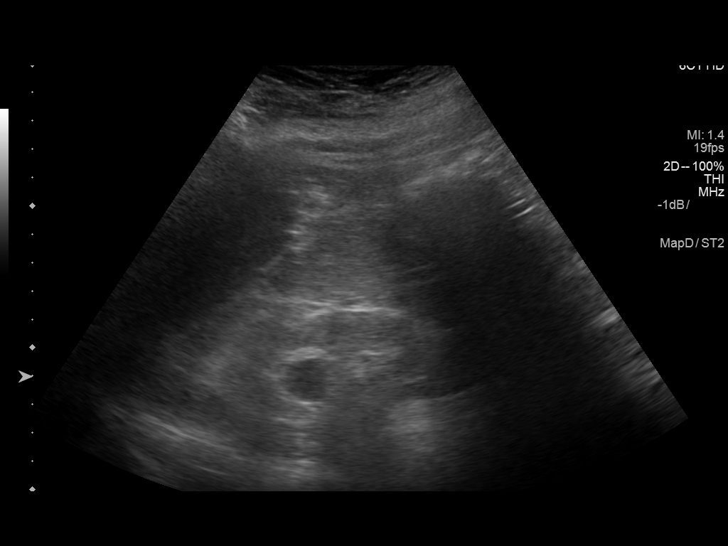
[im 27/36]
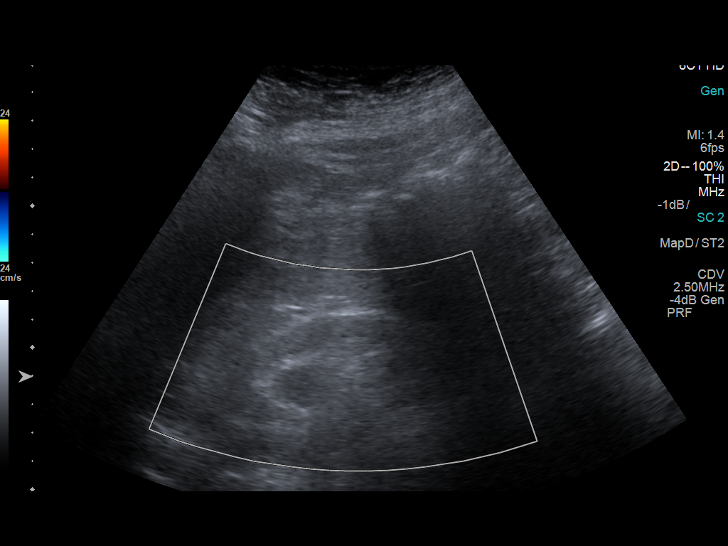
[im 30/36]
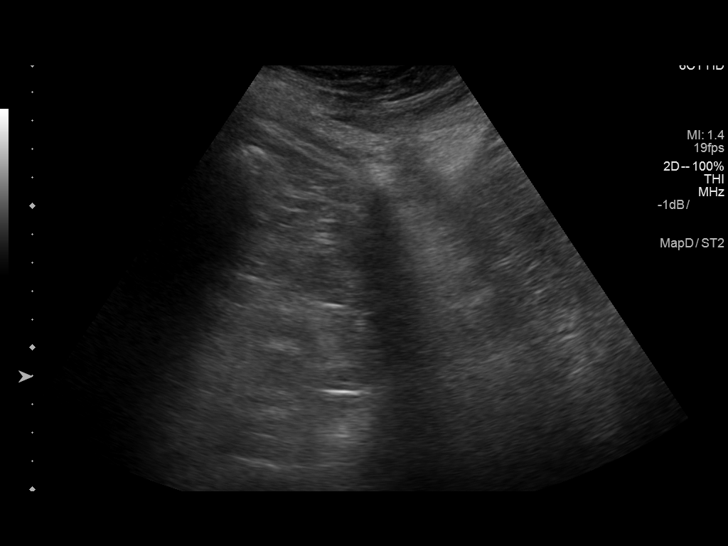
[im 33/36]
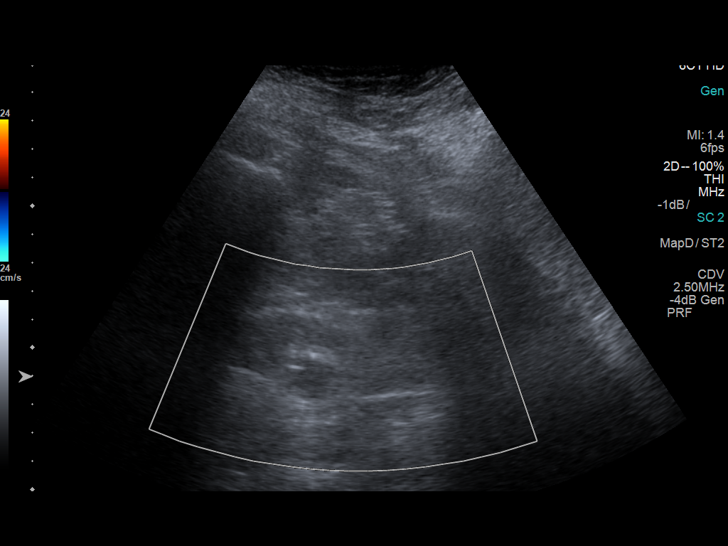
[im 36/36]
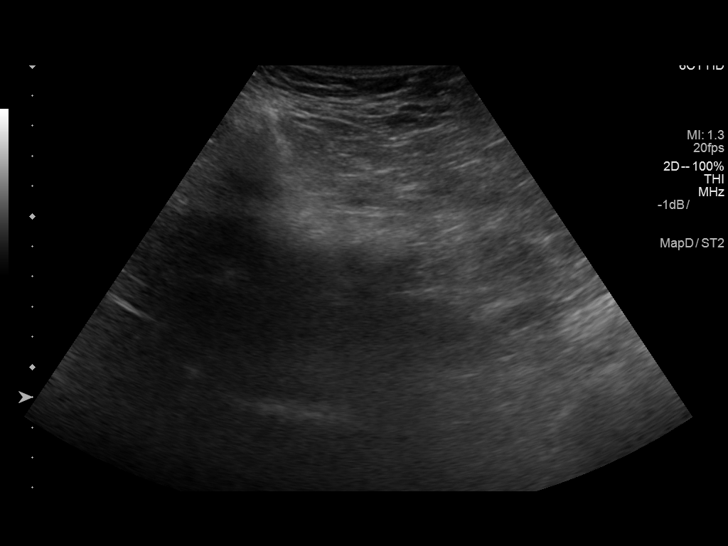

[14 of 25 positions shown; findings below may reference images not displayed]

FINDINGS: Right Kidney:

Length: 7.2 cm. Right upper pole complex renal mass measures 3.6 x
2.8 x 3.2 cm and previously measured 3.5 x 4.0 x 4.5 cm. No
hydronephrosis.

Left Kidney:

Length: 7.6 cm. Left mid complex renal mass measures 1.7 x 1.6 x
cm and previously measured 1.8 x 1.7 x 1.8 cm. No hydronephrosis.

Bladder:

Decompressed.
IMPRESSION: Stable bilateral renal masses.  Malignancy is not excluded.

## 2018-05-05 IMAGING — US IR THROMBECTOMY AV FISTULA W/THROMBOLYSIS/PTA INC/SHUNT/IMG*R*
1 series · 2 of 2 positions shown · non-contrast
Comparison: None.

INDICATION: Clotted graft. The patient's right upper arm dialysis graft was
creating this past Tiger and has been utilized since [REDACTED] The patient has not undergone any interventions on this right
upper arm dialysis graft.

EXAM:
1. FISTULALYSIS
2. ANGIOPLASTY OF VENOUS LIMB AND VENOUS ANASTOMOSIS
3. ULTRASOUND GUIDANCE FOR VENOUS ACCESS
TECHNIQUE: Informed written consent was obtained from the patient after a
discussion of the risks, benefits and alternatives to treatment.
Questions regarding the procedure were encouraged and answered. A
timeout was performed prior to the initiation of the procedure.

[Series 1: ir thrombectomy av fistula w/thrombolysis/pta inc/ · 2 of 2 slices shown]
[im 1/2]
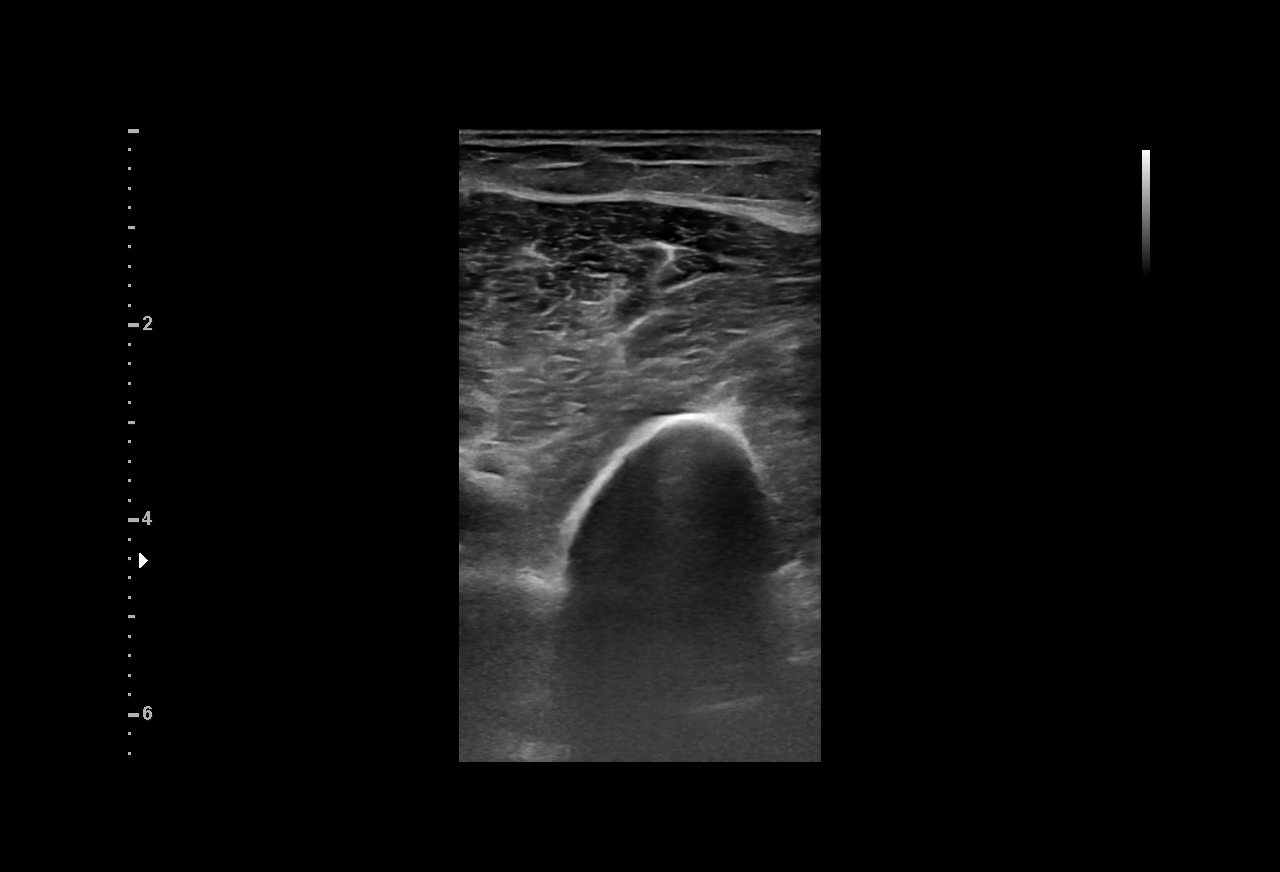
[im 2/2]
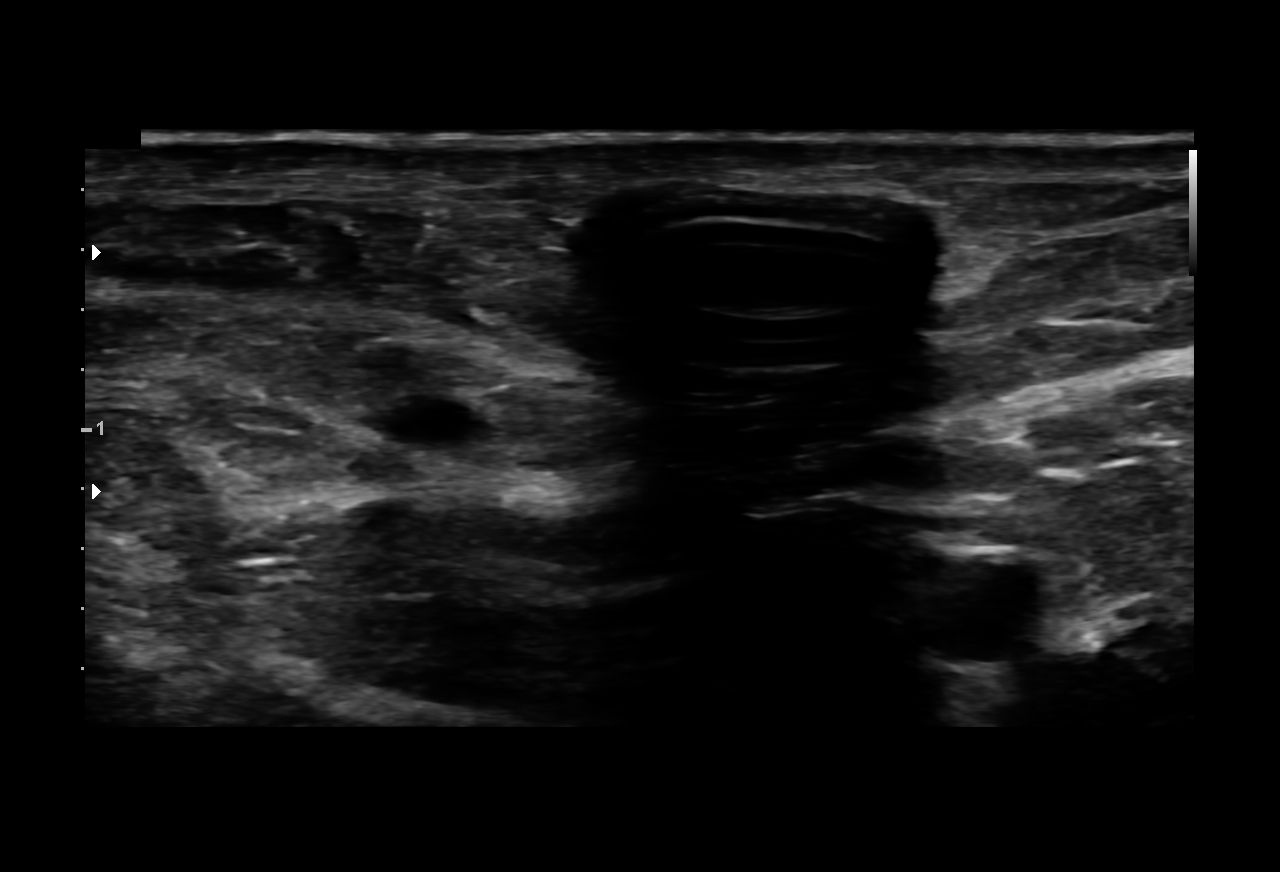

[2 of 2 positions shown; findings below may reference images not displayed]

MEDICATIONS:
Heparin 4,000 units IV; TPA 4 mg into graft.

CONTRAST:  50 cc [id number]

ANESTHESIA/SEDATION:
Moderate (conscious) sedation was employed during this procedure. A
total of Versed 2 mg and Fentanyl 100 mcg was administered
intravenously.

Moderate Sedation Time: 56 minutes. The patient's level of
consciousness and vital signs were monitored continuously by
radiology nursing throughout the procedure under my direct
supervision.

FLUOROSCOPY TIME:  7 minutes 42 seconds (92 mGy)

COMPLICATIONS:
None immediate.
On physical examination, the existing right upper arm dialysis graft
was negative for palpable pulse or thrill. The skin overlying the
graft was prepped and draped in the usual sterile fashion, and a
sterile drape was applied covering the operative field. Maximum
barrier sterile technique with sterile gowns and gloves were used
for the procedure.

Under ultrasound guidance, the dialysis graft was accessed directed
towards the venous anastomosis with a micropuncture kit after the
overlying soft tissues were anesthetized with 1% lidocaine. An
ultrasound image was saved for documentation purposes. The
micropuncture sheath was exchange for a 7-French vascular sheath
over a guidewire. Over a Benson wire, a Kumpe catheter was advanced
centrally and a central venogram was performed. Pull back venogram
was performed with the Kumpe catheter. Heparin was administered
systemically and TPA was administered via the Kumpe catheter
throughout near the entirety of the venous limb.

The venous anastomosis and majority of the venous limb was
angioplastied with a 6 mm x 4 cm Conquest balloon. An additional
access was obtained directed towards the arterial anastomoses with a
micropuncture kit after the overlying soft tissues anesthetized with
1% lidocaine. This allowed for placement of a 6-French vascular
sheath. The graft was thrombectomized with several rounds of
push-pull mechanical thrombectomy with an occlusion balloon. Flow
was restored to the graft as evidenced by restored pulsatility of
the graft and brisk blood return from the side arm of the vascular
sheath. Shuntograms were performed.

Av residual focal moderate (approximate 50%) narrowing of the venous
anastomosis was then sequentially angioplastied in multiple stations
with a 7 mm x 4 cm Conquest balloon. Completion shuntogram images
were obtained in the procedure was terminated.

All wires, catheters and sheaths were removed from the patient.
Hemostasis was achieved at both access sites with deployment of a
swizzle sutures which will be removed at the patient's next dialysis
session. Dressings were placed. The patient tolerated the procedure
without immediate postprocedural complication.
FINDINGS: The existing right upper arm AV graft is thrombosed. The venous
anastomosis and near the entirety of the venous limb was
angioplastied to 6 mm diameter. The graft was successfully
thrombectomized using mechanical and pharmacologic means as above.

Initial post slices shuntogram demonstrates a focal moderate
(approximate 50%) luminal narrowing of the venous anastomosis was
successfully treated with prolonged balloon angioplasty to 7 mm
diameter. Completion shuntogram images demonstrate wide patency of
the venous limb and venous anastomosis without evidence of
complication, specifically, no evidence of contrast extravasation or
vessel dissection.

Reflux shuntogram demonstrates wide patency of the arterial limb and
anastomosis.

The central venous system of the right upper extremity is widely
patent to the level the superior aspect the right atrium.
IMPRESSION: 1. Technically successful right upper arm AV graft thrombolysis.
2. Successful angioplasty of the venous anastomosis stenosis to 7 mm
diameter. Completion shuntogram demonstrates the venous limb and
anastomosis are widely patent.
3. The arterial anastomosis and arterial limb are widely patent.
4. The central venous system is widely patent.

ACCESS:
This graft would be amenable to future percutaneous intervention as
clinically indicated.

## 2018-05-06 ENCOUNTER — Ambulatory Visit: Payer: Medicare Other | Admitting: Podiatry

## 2018-05-06 ENCOUNTER — Ambulatory Visit: Payer: Self-pay | Admitting: Vascular Surgery

## 2018-05-07 DIAGNOSIS — Z992 Dependence on renal dialysis: Secondary | ICD-10-CM | POA: Diagnosis not present

## 2018-05-07 DIAGNOSIS — N2581 Secondary hyperparathyroidism of renal origin: Secondary | ICD-10-CM | POA: Diagnosis not present

## 2018-05-07 DIAGNOSIS — N186 End stage renal disease: Secondary | ICD-10-CM | POA: Diagnosis not present

## 2018-05-07 DIAGNOSIS — D509 Iron deficiency anemia, unspecified: Secondary | ICD-10-CM | POA: Diagnosis not present

## 2018-05-10 DIAGNOSIS — N186 End stage renal disease: Secondary | ICD-10-CM | POA: Diagnosis not present

## 2018-05-10 DIAGNOSIS — N2581 Secondary hyperparathyroidism of renal origin: Secondary | ICD-10-CM | POA: Diagnosis not present

## 2018-05-10 DIAGNOSIS — D509 Iron deficiency anemia, unspecified: Secondary | ICD-10-CM | POA: Diagnosis not present

## 2018-05-10 DIAGNOSIS — Z992 Dependence on renal dialysis: Secondary | ICD-10-CM | POA: Diagnosis not present

## 2018-05-11 ENCOUNTER — Ambulatory Visit: Payer: Medicare Other | Admitting: Podiatry

## 2018-05-12 DIAGNOSIS — N186 End stage renal disease: Secondary | ICD-10-CM | POA: Diagnosis not present

## 2018-05-12 DIAGNOSIS — D509 Iron deficiency anemia, unspecified: Secondary | ICD-10-CM | POA: Diagnosis not present

## 2018-05-12 DIAGNOSIS — N2581 Secondary hyperparathyroidism of renal origin: Secondary | ICD-10-CM | POA: Diagnosis not present

## 2018-05-12 DIAGNOSIS — Z992 Dependence on renal dialysis: Secondary | ICD-10-CM | POA: Diagnosis not present

## 2018-05-13 ENCOUNTER — Other Ambulatory Visit: Payer: Self-pay

## 2018-05-13 ENCOUNTER — Encounter: Payer: Self-pay | Admitting: Vascular Surgery

## 2018-05-13 ENCOUNTER — Ambulatory Visit (INDEPENDENT_AMBULATORY_CARE_PROVIDER_SITE_OTHER): Payer: Medicare Other | Admitting: Vascular Surgery

## 2018-05-13 ENCOUNTER — Ambulatory Visit: Payer: Self-pay | Admitting: Vascular Surgery

## 2018-05-13 VITALS — BP 118/68 | HR 70 | Temp 97.8°F | Resp 20 | Ht 71.0 in | Wt 259.0 lb

## 2018-05-13 DIAGNOSIS — Z992 Dependence on renal dialysis: Secondary | ICD-10-CM | POA: Diagnosis not present

## 2018-05-13 DIAGNOSIS — N186 End stage renal disease: Secondary | ICD-10-CM | POA: Diagnosis not present

## 2018-05-13 NOTE — Progress Notes (Signed)
Patient name: Jack Irwin MRN: [id number] DOB: [birth year] Sex: male  REASON FOR CONSULT: Hemodialysis access  HPI: Jack Irwin is a 53 y.o. male has had multiple prior failed upper extremity access procedures.  He has previously had bilateral upper arm grafts as well as a failed right brachiocephalic AV fistula.  He recently underwent a central venogram on the left side which showed he had a patent axillary and central system.  He has previously had a left upper arm AV graft placed by Dr. Doren Custard in 2016.  This is been occluded for quite some time.  There is a stent in the outflow.  Dr. Doren Custard placed an upper arm loop because at the time of his graft placement he had a high brachial bifurcation.  The artery was also noted to be small in the axilla.  The patient is currently dialyzing via right sided catheter.  His dialysis today is Monday Wednesday Friday.  Other medical problems include neuropathy, diabetes, hypertension all of which are currently stable.  He is on Eliquis for a prior DVT.  Past Medical History:  Diagnosis Date  . Anemia   . Arthritis   . BPH (benign prostatic hyperplasia)   . Chronic back pain   . Difficulty walking   . DVT (deep venous thrombosis) ([place])    Right popliteal DVT December 2017  . End-stage renal disease on hemodialysis ([place])    Dr. Lowanda Foster  - dialysis M/WF  . Erectile dysfunction   . Essential hypertension   . Gout   . Neuropathy, diabetic ([place])   . Type 2 diabetes mellitus ([place])   . Venous (peripheral) insufficiency    Past Surgical History:  Procedure Laterality Date  . AV FISTULA PLACEMENT Left 03/09/2014   Procedure: INSERTION OF ARTERIOVENOUS (AV) GORE-TEX GRAFT ARM;  Surgeon: Angelia Mould, MD;  Location: [facility] OR;  Service: Vascular;  Laterality: Left;  . AV FISTULA PLACEMENT Right 05/06/2016   Procedure: INSERTION OF ARTERIOVENOUS (AV) GORE-TEX GRAFT  Right ARM;  Surgeon: Elam Dutch, MD;  Location: [place];  Service:  Vascular;  Laterality: Right;  . AV FISTULA PLACEMENT Right 12/15/2017   Procedure: ARTERIOVENOUS (AV) BRACHIOCEPHALIC FISTULA CREATION;  Surgeon: Elam Dutch, MD;  Location: [place];  Service: Vascular;  Laterality: Right;  . CYST EXCISION Right    cyst removed on thumb 2001  . CYSTOSCOPY N/A 09/23/2017   Procedure: CYSTOSCOPY;  Surgeon: Cleon Gustin, MD;  Location: [facility];  Service: Urology;  Laterality: N/A;  . INSERTION OF DIALYSIS CATHETER    . INSERTION OF DIALYSIS CATHETER Left 03/09/2014   Procedure: INSERTION OF DIALYSIS CATHETER;  Surgeon: Angelia Mould, MD;  Location: [place];  Service: Vascular;  Laterality: Left;  . IR THROMBECTOMY AV FISTULA W/THROMBOLYSIS/PTA INC/SHUNT/IMG RIGHT Right 11/01/2016  . IR US GUIDE VASC ACCESS RIGHT  11/01/2016  . IRRIGATION AND DEBRIDEMENT ABSCESS N/A 02/25/2016   Procedure: IRRIGATION AND DEBRIDEMENT SCROTAL ABSCESS;  Surgeon: Nickie Retort, MD;  Location: [facility];  Service: Urology;  Laterality: N/A;  . UPPER EXTREMITY VENOGRAPHY Left 04/08/2018   Procedure: UPPER EXTREMITY VENOGRAPHY;  Surgeon: Marty Heck, MD;  Location: [place] [facility];  Service: Cardiovascular;  Laterality: Left;    Family History  Problem Relation Age of Onset  . Diabetes Mother   . Hypertension Mother   . Heart failure Mother   . Hyperlipidemia Mother   . Heart attack Mother   . Cancer Father   .  Diabetes Father   . Hypertension Father   . Hyperlipidemia Father     SOCIAL HISTORY: Social History   Socioeconomic History  . Marital status: Married    Spouse name: Not on file  . Number of children: Not on file  . Years of education: Not on file  . Highest education level: Not on file  Occupational History  . Not on file  Social Needs  . Financial resource strain: Not on file  . Food insecurity:    Worry: Not on file    Inability: Not on file  . Transportation needs:    Medical: Not on file    Non-medical: Not on file  Tobacco  Use  . Smoking status: Never Smoker  . Smokeless tobacco: Never Used  Substance and Sexual Activity  . Alcohol use: No    Alcohol/week: 0.0 standard drinks  . Drug use: No  . Sexual activity: Not on file  Lifestyle  . Physical activity:    Days per week: Not on file    Minutes per session: Not on file  . Stress: Not on file  Relationships  . Social connections:    Talks on phone: Not on file    Gets together: Not on file    Attends religious service: Not on file    Active member of club or organization: Not on file    Attends meetings of clubs or organizations: Not on file    Relationship status: Not on file  . Intimate partner violence:    Fear of current or ex partner: Not on file    Emotionally abused: Not on file    Physically abused: Not on file    Forced sexual activity: Not on file  Other Topics Concern  . Not on file  Social History Narrative  . Not on file    Allergies  Allergen Reactions  . Doxycycline Itching    Current Outpatient Medications  Medication Sig Dispense Refill  . acetaminophen (TYLENOL) 500 MG tablet Take 1,000 mg by mouth every 6 (six) hours as needed for mild pain.     [name] ammonium lactate (AMLACTIN) 12 % lotion Apply 1 application topically as needed for dry skin. 400 g 0  . clotrimazole-betamethasone (LOTRISONE) cream Apply 1 application topically 2 (two) times daily as needed (boils).    . colchicine 0.6 MG tablet Take 0.5 tablets (0.3 mg total) by mouth daily as needed (for gout). 30 tablet 3  . ELIQUIS 2.5 MG TABS tablet TAKE ONE TABLET BY MOUTH 2 TIMES A DAY. 60 tablet 0  . finasteride (PROSCAR) 5 MG tablet Take 5 mg by mouth daily.     [name] gabapentin (NEURONTIN) 100 MG capsule Take 1 capsule (100 mg total) by mouth daily. 30 capsule 3  . Pseudoephedrine-APAP-DM (DAYQUIL PO) Take 1 tablet by mouth every 6 (six) hours as needed (cold symptoms).    . sevelamer carbonate (RENVELA) 800 MG tablet Take 1 tablet (800 mg total) by mouth 2 (two)  times daily between meals as needed (With snacks). (Patient taking differently: Take 1,600-3,200 mg by mouth See admin instructions. Take 3200 mg with each meal and take 1600 mg with each snack) 60 tablet 5  . sildenafil (REVATIO) 20 MG tablet DISSOLVE 1/2 TROCHE UNDER THE TONGUE 60 MINUTES PRIOR TO SEXUAL ENCOUNTER. (Patient taking differently: Take 20 mg by mouth See admin instructions. DISSOLVE 1/2 TROCHE UNDER THE TONGUE 60 MINUTES PRIOR TO SEXUAL ENCOUNTER.) 10 tablet 98  . traMADol (ULTRAM) 50 MG  tablet Take 1 tablet (50 mg total) by mouth every 6 (six) hours as needed. 8 tablet 0  . ULORIC 40 MG tablet Take 1 tablet (40 mg total) by mouth daily. 30 tablet 5   No current facility-administered medications for this visit.     ROS:   General:  No weight loss, Fever, chills  Cardiac: No recent episodes of chest pain/pressure, no shortness of breath at rest.  No shortness of breath with exertion.  Denies history of atrial fibrillation or irregular heartbeat  Pulmonary: No home oxygen, no productive cough, no hemoptysis,  No asthma or wheezing   Physical Examination  Vitals:   05/13/18 0954  BP: 118/68  Pulse: 70  Resp: 20  Temp: 97.8 F (36.6 C)  SpO2: 96%  Weight: 259 lb (117.5 kg)  Height: _0  (1.803 m)    Body mass index is 36.12 kg/m.  General:  Alert and oriented, no acute distress Pulmonary: + Tunneled dialysis catheter Cardiac: Regular Rate and Rhythm Skin: No rash Extremity Pulses: Absent radial, 1+ high brachial pulses bilaterally, occluded bilateral upper arm grafts Musculoskeletal: No deformity or edema  Neurologic: Upper and lower extremity motor 5/5 and symmetric  DATA:  I reviewed the patient's recent central venogram.  He does have a patent axillary vein in the high axilla that would be a reasonable outflow for an AV access.  ASSESSMENT: Left upper arm loop graft when we are allowed to schedule elective procedures in light of the recent COVID.    PLAN: Risk benefits possible complications and procedure details were discussed with the patient today about left upper arm AV graft placement.  This may be of limited durability if the artery is definitely small in the axilla.  However I believe this would be a better option then proceeding to a thigh graft first.  Patient was informed today that we are not currently scheduling elective procedures as long as someone has a functioning access.  He understands this.  As soon as we are allowed to schedule elective procedures at [facility] we will get him on the schedule for a left upper arm loop graft.  Patient prefers local with MAC anesthesia for this procedure due to concerns of his underlying cardiac status.  Ruta Hinds, MD [facility] Office: [phone] Pager: [phone]

## 2018-05-14 DIAGNOSIS — Z992 Dependence on renal dialysis: Secondary | ICD-10-CM | POA: Diagnosis not present

## 2018-05-14 DIAGNOSIS — N2581 Secondary hyperparathyroidism of renal origin: Secondary | ICD-10-CM | POA: Diagnosis not present

## 2018-05-14 DIAGNOSIS — D509 Iron deficiency anemia, unspecified: Secondary | ICD-10-CM | POA: Diagnosis not present

## 2018-05-14 DIAGNOSIS — N186 End stage renal disease: Secondary | ICD-10-CM | POA: Diagnosis not present

## 2018-05-17 ENCOUNTER — Other Ambulatory Visit: Payer: Self-pay | Admitting: Family Medicine

## 2018-05-17 DIAGNOSIS — N2581 Secondary hyperparathyroidism of renal origin: Secondary | ICD-10-CM | POA: Diagnosis not present

## 2018-05-17 DIAGNOSIS — D509 Iron deficiency anemia, unspecified: Secondary | ICD-10-CM | POA: Diagnosis not present

## 2018-05-17 DIAGNOSIS — N186 End stage renal disease: Secondary | ICD-10-CM | POA: Diagnosis not present

## 2018-05-17 DIAGNOSIS — E119 Type 2 diabetes mellitus without complications: Secondary | ICD-10-CM | POA: Diagnosis not present

## 2018-05-17 DIAGNOSIS — Z992 Dependence on renal dialysis: Secondary | ICD-10-CM | POA: Diagnosis not present

## 2018-05-19 DIAGNOSIS — N186 End stage renal disease: Secondary | ICD-10-CM | POA: Diagnosis not present

## 2018-05-19 DIAGNOSIS — N2581 Secondary hyperparathyroidism of renal origin: Secondary | ICD-10-CM | POA: Diagnosis not present

## 2018-05-19 DIAGNOSIS — Z992 Dependence on renal dialysis: Secondary | ICD-10-CM | POA: Diagnosis not present

## 2018-05-19 DIAGNOSIS — D509 Iron deficiency anemia, unspecified: Secondary | ICD-10-CM | POA: Diagnosis not present

## 2018-05-21 DIAGNOSIS — N186 End stage renal disease: Secondary | ICD-10-CM | POA: Diagnosis not present

## 2018-05-21 DIAGNOSIS — Z992 Dependence on renal dialysis: Secondary | ICD-10-CM | POA: Diagnosis not present

## 2018-05-21 DIAGNOSIS — D509 Iron deficiency anemia, unspecified: Secondary | ICD-10-CM | POA: Diagnosis not present

## 2018-05-21 DIAGNOSIS — N2581 Secondary hyperparathyroidism of renal origin: Secondary | ICD-10-CM | POA: Diagnosis not present

## 2018-05-24 ENCOUNTER — Other Ambulatory Visit: Payer: Self-pay | Admitting: Family Medicine

## 2018-05-24 DIAGNOSIS — Z992 Dependence on renal dialysis: Secondary | ICD-10-CM | POA: Diagnosis not present

## 2018-05-24 DIAGNOSIS — N186 End stage renal disease: Secondary | ICD-10-CM | POA: Diagnosis not present

## 2018-05-24 DIAGNOSIS — Z7901 Long term (current) use of anticoagulants: Secondary | ICD-10-CM

## 2018-05-24 DIAGNOSIS — N2581 Secondary hyperparathyroidism of renal origin: Secondary | ICD-10-CM | POA: Diagnosis not present

## 2018-05-24 DIAGNOSIS — D509 Iron deficiency anemia, unspecified: Secondary | ICD-10-CM | POA: Diagnosis not present

## 2018-05-26 DIAGNOSIS — Z992 Dependence on renal dialysis: Secondary | ICD-10-CM | POA: Diagnosis not present

## 2018-05-26 DIAGNOSIS — D509 Iron deficiency anemia, unspecified: Secondary | ICD-10-CM | POA: Diagnosis not present

## 2018-05-26 DIAGNOSIS — N186 End stage renal disease: Secondary | ICD-10-CM | POA: Diagnosis not present

## 2018-05-26 DIAGNOSIS — N2581 Secondary hyperparathyroidism of renal origin: Secondary | ICD-10-CM | POA: Diagnosis not present

## 2018-05-27 DIAGNOSIS — Z992 Dependence on renal dialysis: Secondary | ICD-10-CM | POA: Diagnosis not present

## 2018-05-27 DIAGNOSIS — T8249XA Other complication of vascular dialysis catheter, initial encounter: Secondary | ICD-10-CM | POA: Diagnosis not present

## 2018-05-27 DIAGNOSIS — N186 End stage renal disease: Secondary | ICD-10-CM | POA: Diagnosis not present

## 2018-05-28 DIAGNOSIS — N186 End stage renal disease: Secondary | ICD-10-CM | POA: Diagnosis not present

## 2018-05-28 DIAGNOSIS — N2581 Secondary hyperparathyroidism of renal origin: Secondary | ICD-10-CM | POA: Diagnosis not present

## 2018-05-28 DIAGNOSIS — D509 Iron deficiency anemia, unspecified: Secondary | ICD-10-CM | POA: Diagnosis not present

## 2018-05-28 DIAGNOSIS — Z992 Dependence on renal dialysis: Secondary | ICD-10-CM | POA: Diagnosis not present

## 2018-05-31 DIAGNOSIS — N2581 Secondary hyperparathyroidism of renal origin: Secondary | ICD-10-CM | POA: Diagnosis not present

## 2018-05-31 DIAGNOSIS — N186 End stage renal disease: Secondary | ICD-10-CM | POA: Diagnosis not present

## 2018-05-31 DIAGNOSIS — D509 Iron deficiency anemia, unspecified: Secondary | ICD-10-CM | POA: Diagnosis not present

## 2018-05-31 DIAGNOSIS — Z992 Dependence on renal dialysis: Secondary | ICD-10-CM | POA: Diagnosis not present

## 2018-06-01 ENCOUNTER — Ambulatory Visit: Payer: Medicare Other | Admitting: Podiatry

## 2018-06-02 DIAGNOSIS — Z992 Dependence on renal dialysis: Secondary | ICD-10-CM | POA: Diagnosis not present

## 2018-06-02 DIAGNOSIS — D509 Iron deficiency anemia, unspecified: Secondary | ICD-10-CM | POA: Diagnosis not present

## 2018-06-02 DIAGNOSIS — N2581 Secondary hyperparathyroidism of renal origin: Secondary | ICD-10-CM | POA: Diagnosis not present

## 2018-06-02 DIAGNOSIS — N186 End stage renal disease: Secondary | ICD-10-CM | POA: Diagnosis not present

## 2018-06-03 DIAGNOSIS — Z992 Dependence on renal dialysis: Secondary | ICD-10-CM | POA: Diagnosis not present

## 2018-06-03 DIAGNOSIS — N186 End stage renal disease: Secondary | ICD-10-CM | POA: Diagnosis not present

## 2018-06-04 DIAGNOSIS — R112 Nausea with vomiting, unspecified: Secondary | ICD-10-CM | POA: Diagnosis not present

## 2018-06-04 DIAGNOSIS — N186 End stage renal disease: Secondary | ICD-10-CM | POA: Diagnosis not present

## 2018-06-04 DIAGNOSIS — N2581 Secondary hyperparathyroidism of renal origin: Secondary | ICD-10-CM | POA: Diagnosis not present

## 2018-06-04 DIAGNOSIS — Z992 Dependence on renal dialysis: Secondary | ICD-10-CM | POA: Diagnosis not present

## 2018-06-04 DIAGNOSIS — D509 Iron deficiency anemia, unspecified: Secondary | ICD-10-CM | POA: Diagnosis not present

## 2018-06-07 DIAGNOSIS — R112 Nausea with vomiting, unspecified: Secondary | ICD-10-CM | POA: Diagnosis not present

## 2018-06-07 DIAGNOSIS — N2581 Secondary hyperparathyroidism of renal origin: Secondary | ICD-10-CM | POA: Diagnosis not present

## 2018-06-07 DIAGNOSIS — N186 End stage renal disease: Secondary | ICD-10-CM | POA: Diagnosis not present

## 2018-06-07 DIAGNOSIS — Z992 Dependence on renal dialysis: Secondary | ICD-10-CM | POA: Diagnosis not present

## 2018-06-07 DIAGNOSIS — D509 Iron deficiency anemia, unspecified: Secondary | ICD-10-CM | POA: Diagnosis not present

## 2018-06-08 ENCOUNTER — Ambulatory Visit (INDEPENDENT_AMBULATORY_CARE_PROVIDER_SITE_OTHER): Payer: Medicare Other | Admitting: Podiatry

## 2018-06-08 ENCOUNTER — Encounter: Payer: Self-pay | Admitting: Podiatry

## 2018-06-08 ENCOUNTER — Other Ambulatory Visit: Payer: Self-pay

## 2018-06-08 VITALS — BP 75/56 | Temp 97.9°F

## 2018-06-08 DIAGNOSIS — E1142 Type 2 diabetes mellitus with diabetic polyneuropathy: Secondary | ICD-10-CM

## 2018-06-08 DIAGNOSIS — M79676 Pain in unspecified toe(s): Secondary | ICD-10-CM

## 2018-06-08 DIAGNOSIS — B353 Tinea pedis: Secondary | ICD-10-CM | POA: Diagnosis not present

## 2018-06-08 DIAGNOSIS — L84 Corns and callosities: Secondary | ICD-10-CM

## 2018-06-08 DIAGNOSIS — B351 Tinea unguium: Secondary | ICD-10-CM | POA: Diagnosis not present

## 2018-06-08 MED ORDER — CLOTRIMAZOLE 1 % EX CREA: TOPICAL_CREAM | CUTANEOUS | 1 refills | Status: AC

## 2018-06-08 NOTE — Patient Instructions (Signed)
Diabetes Mellitus and Foot Care  Foot care is an important part of your health, especially when you have diabetes. Diabetes may cause you to have problems because of poor blood flow (circulation) to your feet and legs, which can cause your skin to:   Become thinner and drier.   Break more easily.   Heal more slowly.   Peel and crack.  You may also have nerve damage (neuropathy) in your legs and feet, causing decreased feeling in them. This means that you may not notice minor injuries to your feet that could lead to more serious problems. Noticing and addressing any potential problems early is the best way to prevent future foot problems.  How to care for your feet  Foot hygiene   Wash your feet daily with warm water and mild soap. Do not use hot water. Then, pat your feet and the areas between your toes until they are completely dry. Do not soak your feet as this can dry your skin.   Trim your toenails straight across. Do not dig under them or around the cuticle. File the edges of your nails with an emery board or nail file.   Apply a moisturizing lotion or petroleum jelly to the skin on your feet and to dry, brittle toenails. Use lotion that does not contain alcohol and is unscented. Do not apply lotion between your toes.  Shoes and socks   Wear clean socks or stockings every day. Make sure they are not too tight. Do not wear knee-high stockings since they may decrease blood flow to your legs.   Wear shoes that fit properly and have enough cushioning. Always look in your shoes before you put them on to be sure there are no objects inside.   To break in new shoes, wear them for just a few hours a day. This prevents injuries on your feet.  Wounds, scrapes, corns, and calluses   Check your feet daily for blisters, cuts, bruises, sores, and redness. If you cannot see the bottom of your feet, use a mirror or ask someone for help.   Do not cut corns or calluses or try to remove them with medicine.   If you  find a minor scrape, cut, or break in the skin on your feet, keep it and the skin around it clean and dry. You may clean these areas with mild soap and water. Do not clean the area with peroxide, alcohol, or iodine.   If you have a wound, scrape, corn, or callus on your foot, look at it several times a day to make sure it is healing and not infected. Check for:  ? Redness, swelling, or pain.  ? Fluid or blood.  ? Warmth.  ? Pus or a bad smell.  General instructions   Do not cross your legs. This may decrease blood flow to your feet.   Do not use heating pads or hot water bottles on your feet. They may burn your skin. If you have lost feeling in your feet or legs, you may not know this is happening until it is too late.   Protect your feet from hot and cold by wearing shoes, such as at the beach or on hot pavement.   Schedule a complete foot exam at least once a year (annually) or more often if you have foot problems. If you have foot problems, report any cuts, sores, or bruises to your health care provider immediately.  Contact a health care provider if:     You have a medical condition that increases your risk of infection and you have any cuts, sores, or bruises on your feet.   You have an injury that is not healing.   You have redness on your legs or feet.   You feel burning or tingling in your legs or feet.   You have pain or cramps in your legs and feet.   Your legs or feet are numb.   Your feet always feel cold.   You have pain around a toenail.  Get help right away if:   You have a wound, scrape, corn, or callus on your foot and:  ? You have pain, swelling, or redness that gets worse.  ? You have fluid or blood coming from the wound, scrape, corn, or callus.  ? Your wound, scrape, corn, or callus feels warm to the touch.  ? You have pus or a bad smell coming from the wound, scrape, corn, or callus.  ? You have a fever.  ? You have a red line going up your leg.  Summary   Check your feet every day  for cuts, sores, red spots, swelling, and blisters.   Moisturize feet and legs daily.   Wear shoes that fit properly and have enough cushioning.   If you have foot problems, report any cuts, sores, or bruises to your health care provider immediately.   Schedule a complete foot exam at least once a year (annually) or more often if you have foot problems.  This information is not intended to replace advice given to you by your health care provider. Make sure you discuss any questions you have with your health care provider.  Document Released: 01/18/2000 Document Revised: 03/04/2017 Document Reviewed: 02/22/2016  Elsevier Interactive Patient Education  2019 [employer]

## 2018-06-09 DIAGNOSIS — Z992 Dependence on renal dialysis: Secondary | ICD-10-CM | POA: Diagnosis not present

## 2018-06-09 DIAGNOSIS — N2581 Secondary hyperparathyroidism of renal origin: Secondary | ICD-10-CM | POA: Diagnosis not present

## 2018-06-09 DIAGNOSIS — R112 Nausea with vomiting, unspecified: Secondary | ICD-10-CM | POA: Diagnosis not present

## 2018-06-09 DIAGNOSIS — D509 Iron deficiency anemia, unspecified: Secondary | ICD-10-CM | POA: Diagnosis not present

## 2018-06-09 DIAGNOSIS — N186 End stage renal disease: Secondary | ICD-10-CM | POA: Diagnosis not present

## 2018-06-10 ENCOUNTER — Ambulatory Visit: Payer: Self-pay | Admitting: Family Medicine

## 2018-06-11 DIAGNOSIS — Z992 Dependence on renal dialysis: Secondary | ICD-10-CM | POA: Diagnosis not present

## 2018-06-11 DIAGNOSIS — R112 Nausea with vomiting, unspecified: Secondary | ICD-10-CM | POA: Diagnosis not present

## 2018-06-11 DIAGNOSIS — D509 Iron deficiency anemia, unspecified: Secondary | ICD-10-CM | POA: Diagnosis not present

## 2018-06-11 DIAGNOSIS — N2581 Secondary hyperparathyroidism of renal origin: Secondary | ICD-10-CM | POA: Diagnosis not present

## 2018-06-11 DIAGNOSIS — N186 End stage renal disease: Secondary | ICD-10-CM | POA: Diagnosis not present

## 2018-06-14 DIAGNOSIS — N2581 Secondary hyperparathyroidism of renal origin: Secondary | ICD-10-CM | POA: Diagnosis not present

## 2018-06-14 DIAGNOSIS — N186 End stage renal disease: Secondary | ICD-10-CM | POA: Diagnosis not present

## 2018-06-14 DIAGNOSIS — R112 Nausea with vomiting, unspecified: Secondary | ICD-10-CM | POA: Diagnosis not present

## 2018-06-14 DIAGNOSIS — D509 Iron deficiency anemia, unspecified: Secondary | ICD-10-CM | POA: Diagnosis not present

## 2018-06-14 DIAGNOSIS — Z992 Dependence on renal dialysis: Secondary | ICD-10-CM | POA: Diagnosis not present

## 2018-06-15 ENCOUNTER — Other Ambulatory Visit: Payer: Self-pay | Admitting: *Deleted

## 2018-06-15 ENCOUNTER — Encounter: Payer: Self-pay | Admitting: *Deleted

## 2018-06-15 NOTE — Progress Notes (Signed)
Surgery scheduled with patient.07/06/2018. Instructions per instruction letter. Reviewed instructions and letter faxed to [facility] for patient.

## 2018-06-15 NOTE — Progress Notes (Signed)
Subjective: Jack Irwin presents to clinic for preventative diabetic foot care on today. Patient presents with history of diabetes, diabetic neuropathy and cc of painful, discolored, thick toenails and calluses b/l feet.  Toenail pain is getting progressively worse and relieved with periodic professional debridement.   Jack Boast, FNP is his PCP and last visit was 4-5 months ago.  Jack Irwin is also on blood thinner, Eliquis.   Allergies  Allergen Reactions  . Doxycycline Itching    Objective:  Vascular Examination: Capillary refill time <3 seconds x 10 digits.  Dorsalis pedis pulses absent b/l.  Posterior tibial pulses absent b/l.  No digital hair x 10 digits.  Skin temperature gradient WNL b/l.  Dermatological Examination: Skin with normal turgor, texture and tone b/l.  Toenails 1-5 b/l discolored, thick, dystrophic with subungual debris and pain with palpation to nailbeds due to thickness of nails.  Hyperkeratotic lesion submet head 5 left foot. No erythema, no edema, no drainage, no flocculence noted.   Diffuse scaling noted peripherally and plantarly b/l feet with mild foot odor.  No interdigital macerations.  No blisters, no weeping. No signs of secondary bacterial infection noted.  No open wounds.    No interdigital macerations noted.  Musculoskeletal: Muscle strength 5/5 to all LE muscle groups.  No gross bony deformities b/l.  Neurological: Sensation absent 5/5 b/l with 10 gram monofilament.  Vibratory sensation diminished b/l.  Assessment: 1. Painful onychomycosis toenails 1-5 b/l 2. Callus submet head 5 left foot 3. Tinea pedis b/l 4. NIDDM with Peripheral arterial disease and neuropathy  Plan: 1. Discuss diabetic foot care principles. Literature dispensed. 2. Toenails 1-5 b/l were debrided in length and girth without iatrogenic bleeding 3. Rx written for nonformulary compounding topical antifungal: [facility]: Antifungal  cream - Terbinafine 3%, Fluconazole 2%, Tea Tree Oil 5%, Urea 10%, Ibuprofen 2% in DMSO Suspension #28ml. Apply to the affected nail(s) at bedtime. Rx for Clotrimazole Cream 1% to be applied to both feet and between toes bid x 4 weeks. 4. Calluses pared submetatarsal head(s) 5 left foot utilizing sterile scalpel blade without incident.  5. Patient to continue soft, supportive shoe gear daily. Explained procedure for him acquiring diabetic shoes. Per Medicare guidelines, patient's feet need to be evaluated by an MD/DO managing patient's diabetes and diabetic shoe certification form needs to be signed by the MD/DO. If patient's diabetes is being managed by an Endocrinologist, the Endocrinologist must evaluate patient's feet and sign the Medicare diabetic shoe certification form. Patient related understanding. 6. Patient to report any pedal injuries to medical professional immediately. 7. Follow up 3 months.  8. Patient/POA to call should there be a concern in the interim.

## 2018-06-16 ENCOUNTER — Encounter: Payer: Self-pay | Admitting: Podiatry

## 2018-06-16 DIAGNOSIS — R112 Nausea with vomiting, unspecified: Secondary | ICD-10-CM | POA: Diagnosis not present

## 2018-06-16 DIAGNOSIS — N186 End stage renal disease: Secondary | ICD-10-CM | POA: Diagnosis not present

## 2018-06-16 DIAGNOSIS — Z992 Dependence on renal dialysis: Secondary | ICD-10-CM | POA: Diagnosis not present

## 2018-06-16 DIAGNOSIS — D509 Iron deficiency anemia, unspecified: Secondary | ICD-10-CM | POA: Diagnosis not present

## 2018-06-16 DIAGNOSIS — N2581 Secondary hyperparathyroidism of renal origin: Secondary | ICD-10-CM | POA: Diagnosis not present

## 2018-06-17 ENCOUNTER — Telehealth: Payer: Self-pay | Admitting: Cardiology

## 2018-06-17 ENCOUNTER — Encounter: Payer: Self-pay | Admitting: Family Medicine

## 2018-06-17 ENCOUNTER — Ambulatory Visit (INDEPENDENT_AMBULATORY_CARE_PROVIDER_SITE_OTHER): Payer: Medicare Other | Admitting: Family Medicine

## 2018-06-17 ENCOUNTER — Other Ambulatory Visit: Payer: Self-pay

## 2018-06-17 VITALS — BP 92/70 | HR 91 | Temp 97.8°F | Resp 16 | Ht 71.0 in | Wt 261.0 lb

## 2018-06-17 DIAGNOSIS — N186 End stage renal disease: Secondary | ICD-10-CM

## 2018-06-17 DIAGNOSIS — E1122 Type 2 diabetes mellitus with diabetic chronic kidney disease: Secondary | ICD-10-CM

## 2018-06-17 DIAGNOSIS — I953 Hypotension of hemodialysis: Secondary | ICD-10-CM

## 2018-06-17 DIAGNOSIS — Z992 Dependence on renal dialysis: Secondary | ICD-10-CM | POA: Diagnosis not present

## 2018-06-17 MED ORDER — SEVELAMER CARBONATE 800 MG PO TABS: 1600.0000 mg | ORAL_TABLET | ORAL | 5 refills | Status: AC

## 2018-06-17 NOTE — Telephone Encounter (Signed)
Patient is etablished with Dr Domenic Polite already Called patient to set Virtual appt up and he prefers to be seen in office. Will send phone note to clinical to address to see if he needs to be seen in office or if he can do Phone visit.

## 2018-06-17 NOTE — Progress Notes (Signed)
Patient [facility]   Progress Note: General Provider: Lanae Boast, FNP  SUBJECTIVE:   Jack Irwin is a 53 y.o. male who  has a past medical history of Anemia, Arthritis, BPH (benign prostatic hyperplasia), Chronic back pain, Difficulty walking, DVT (deep venous thrombosis) ([place]), End-stage renal disease on hemodialysis ([place]), Erectile dysfunction, Essential hypertension, Gout, Neuropathy, diabetic ([place]), Type 2 diabetes mellitus ([place]), and Venous (peripheral) insufficiency.. Patient presents today for Follow-up (needs a referral for cardiology for bp being low ) and Diabetes Patient reports several episodes of low BP while at dialysis. Was placed on midodrine 10 mg 30 minutes prior to dialysis and continues to have low BP. He states that his dry weight has been changed from 117 kg to 118 kg. Since this change, he states that he feels dizzy. Patient states that he has a prescription for sildenafil that he takes as needed.  He states that he would like to have a referral to cardiology for this. History of DM2, last A1c 3 months ago -5.0.  Patient states that he was seen by podiatry and will send report to this clinic in order to get diabetic shoes.     Review of Systems  Constitutional: Negative.   HENT: Negative.   Eyes: Negative.   Respiratory: Negative.   Cardiovascular: Negative.   Gastrointestinal: Negative.   Genitourinary: Negative.   Musculoskeletal: Negative.   Skin: Negative.   Neurological: Negative.   Psychiatric/Behavioral: Negative.      OBJECTIVE: BP 92/70 (BP Location: Right Arm, Patient Position: Sitting, Cuff Size: Large)   Pulse 91   Temp 97.8 F (36.6 C) (Oral)   Resp 16   Ht 5\' 11"  (1.803 m)   Wt 261 lb (118.4 kg)   SpO2 98%   BMI 36.40 kg/m   Wt Readings from Last 3 Encounters:  06/17/18 261 lb (118.4 kg)  05/13/18 259 lb (117.5 kg)  04/08/18 260 lb (117.9 kg)     Physical Exam Vitals signs and  nursing note reviewed.  Constitutional:      General: He is not in acute distress.    Appearance: Normal appearance.  HENT:     Head: Normocephalic and atraumatic.  Eyes:     Extraocular Movements: Extraocular movements intact.     Conjunctiva/sclera: Conjunctivae normal.     Pupils: Pupils are equal, round, and reactive to light.  Cardiovascular:     Rate and Rhythm: Normal rate and regular rhythm.     Heart sounds: No murmur.  Pulmonary:     Effort: Pulmonary effort is normal.     Breath sounds: Normal breath sounds.  Musculoskeletal: Normal range of motion.  Skin:    General: Skin is warm and dry.  Neurological:     Mental Status: He is alert and oriented to person, place, and time.  Psychiatric:        Mood and Affect: Mood normal.        Behavior: Behavior normal.        Thought Content: Thought content normal.        Judgment: Judgment normal.     ASSESSMENT/PLAN:   1. Type 2 diabetes mellitus with chronic kidney disease on chronic dialysis, without long-term current use of insulin (HCC) Continue with current treatment - Urinalysis Dipstick - Microalbumin/Creatinine Ratio, Urine  2. ESRD on dialysis [facility]) Continue with current treatement.  - sevelamer carbonate (RENVELA) 800 MG tablet; Take 2-4 tablets (1,600-3,200 mg total) by mouth See admin  instructions. Take 3200 mg with each meal and take 1600 mg with each snack  Dispense: 420 tablet; Refill: 5  3. Hemodialysis-associated hypotension D/c Revatio as this can cause a decrease in BP. Patient requesting referral to cardiology for further testing.  - Ambulatory referral to Cardiology    Return in about 3 months (around 09/17/2018) for HTN.    The patient was given clear instructions to go to ER or return to medical center if symptoms do not improve, worsen or new problems develop. The patient verbalized understanding and agreed with plan of care.   Ms. Doug Sou. Nathaneil Canary, FNP-BC [facility] [address] [place], [place] [address] [phone]

## 2018-06-18 ENCOUNTER — Telehealth: Payer: Self-pay | Admitting: Cardiology

## 2018-06-18 DIAGNOSIS — R112 Nausea with vomiting, unspecified: Secondary | ICD-10-CM | POA: Diagnosis not present

## 2018-06-18 DIAGNOSIS — Z992 Dependence on renal dialysis: Secondary | ICD-10-CM | POA: Diagnosis not present

## 2018-06-18 DIAGNOSIS — N186 End stage renal disease: Secondary | ICD-10-CM | POA: Diagnosis not present

## 2018-06-18 DIAGNOSIS — N2581 Secondary hyperparathyroidism of renal origin: Secondary | ICD-10-CM | POA: Diagnosis not present

## 2018-06-18 DIAGNOSIS — D509 Iron deficiency anemia, unspecified: Secondary | ICD-10-CM | POA: Diagnosis not present

## 2018-06-18 NOTE — Telephone Encounter (Signed)
Returned pt call. No answer, left message for pt to call back.

## 2018-06-18 NOTE — Telephone Encounter (Signed)
Called pt. No answer, left detailed message on pt's private voicemail.

## 2018-06-18 NOTE — Telephone Encounter (Signed)
Mr. Elahi returned call to Debbora Lacrosse

## 2018-06-18 NOTE — Telephone Encounter (Signed)
Virtual Visit Pre-Appointment Phone Call  "(Name), I am calling you today to discuss your upcoming appointment. We are currently trying to limit exposure to the virus that causes COVID-19 by seeing patients at home rather than in the office."  1. "What is the BEST phone number to call the day of the visit?" - include this in appointment notes  2. Do you have or have access to (through a family member/friend) a smartphone with video capability that we can use for your visit?" a. If yes - list this number in appt notes as cell (if different from BEST phone #) and list the appointment type as a VIDEO visit in appointment notes b. If no - list the appointment type as a PHONE visit in appointment notes  3. Confirm consent - "In the setting of the current Covid19 crisis, you are scheduled for a (phone or video) visit with your provider on (date) at (time).  Just as we do with many in-office visits, in order for you to participate in this visit, we must obtain consent.  If you'd like, I can send this to your mychart (if signed up) or email for you to review.  Otherwise, I can obtain your verbal consent now.  All virtual visits are billed to your insurance company just like a normal visit would be.  By agreeing to a virtual visit, we'd like you to understand that the technology does not allow for your provider to perform an examination, and thus may limit your provider's ability to fully assess your condition. If your provider identifies any concerns that need to be evaluated in person, we will make arrangements to do so.  Finally, though the technology is pretty good, we cannot assure that it will always work on either your or our end, and in the setting of a video visit, we may have to convert it to a phone-only visit.  In either situation, we cannot ensure that we have a secure connection.  Are you willing to proceed?" STAFF: Did the patient verbally acknowledge consent to telehealth visit? Document  YES/NO here: Yes  4. Advise patient to be prepared - "Two hours prior to your appointment, go ahead and check your blood pressure, pulse, oxygen saturation, and your weight (if you have the equipment to check those) and write them all down. When your visit starts, your provider will ask you for this information. If you have an Apple Watch or Kardia device, please plan to have heart rate information ready on the day of your appointment. Please have a pen and paper handy nearby the day of the visit as well."  5. Give patient instructions for MyChart download to smartphone OR [email or web address] as below if video visit (depending on what platform provider is using)  6. Inform patient they will receive a phone call 15 minutes prior to their appointment time (may be from unknown caller ID) so they should be prepared to answer    TELEPHONE CALL NOTE  AJAX SCHROLL has been deemed a candidate for a follow-up tele-health visit to limit community exposure during the Covid-19 pandemic. I spoke with the patient via phone to ensure availability of phone/video source, confirm preferred email & phone number, and discuss instructions and expectations.  I reminded KOBY PICKUP to be prepared with any vital sign and/or heart rhythm information that could potentially be obtained via home monitoring, at the time of his visit. I reminded MARQUAL MI to expect a phone call prior to  his visit.  Orinda Kenner 06/18/2018 2:55 PM

## 2018-06-18 NOTE — Telephone Encounter (Signed)
Pt last seen in 2018 by cardiology. I will forward to Dr. Domenic Polite to determine if he would rather do telehealth visit or office visit at patient's request. Pt doesn't have anything urgent, just does not like doing "phone visits."

## 2018-06-18 NOTE — Telephone Encounter (Signed)
I reviewed the chart.  Actually, we only saw him because he was following in our anticoagulation on Coumadin at the time with history of DVT.  It looks like he has since been switched to Eliquis and is being followed by PCP and Vascular.  No clear indication for [place] to continue cardiology specific follow-up at this time.  He can maintain follow-up with his PCP at this point.

## 2018-06-21 ENCOUNTER — Telehealth: Payer: Self-pay

## 2018-06-21 ENCOUNTER — Other Ambulatory Visit: Payer: Self-pay

## 2018-06-21 DIAGNOSIS — R112 Nausea with vomiting, unspecified: Secondary | ICD-10-CM | POA: Diagnosis not present

## 2018-06-21 DIAGNOSIS — Z992 Dependence on renal dialysis: Secondary | ICD-10-CM | POA: Diagnosis not present

## 2018-06-21 DIAGNOSIS — D509 Iron deficiency anemia, unspecified: Secondary | ICD-10-CM | POA: Diagnosis not present

## 2018-06-21 DIAGNOSIS — I10 Essential (primary) hypertension: Secondary | ICD-10-CM

## 2018-06-21 DIAGNOSIS — N186 End stage renal disease: Secondary | ICD-10-CM | POA: Diagnosis not present

## 2018-06-21 DIAGNOSIS — N2581 Secondary hyperparathyroidism of renal origin: Secondary | ICD-10-CM | POA: Diagnosis not present

## 2018-06-21 MED ORDER — GABAPENTIN 100 MG PO CAPS: 100.0000 mg | ORAL_CAPSULE | Freq: Every day | ORAL | 0 refills | Status: DC

## 2018-06-21 NOTE — Telephone Encounter (Signed)
Patient is asking for a Prescription for a Blood pressure monitor. Can you write this for him?

## 2018-06-22 MED ORDER — BLOOD PRESSURE MONITOR DEVI: 1.0000 | Freq: Every day | 0 refills | Status: DC

## 2018-06-23 ENCOUNTER — Encounter: Payer: Self-pay | Admitting: Vascular Surgery

## 2018-06-24 DIAGNOSIS — N186 End stage renal disease: Secondary | ICD-10-CM | POA: Diagnosis not present

## 2018-06-24 DIAGNOSIS — R112 Nausea with vomiting, unspecified: Secondary | ICD-10-CM | POA: Diagnosis not present

## 2018-06-24 DIAGNOSIS — D509 Iron deficiency anemia, unspecified: Secondary | ICD-10-CM | POA: Diagnosis not present

## 2018-06-24 DIAGNOSIS — Z992 Dependence on renal dialysis: Secondary | ICD-10-CM | POA: Diagnosis not present

## 2018-06-24 DIAGNOSIS — N2581 Secondary hyperparathyroidism of renal origin: Secondary | ICD-10-CM | POA: Diagnosis not present

## 2018-06-25 DIAGNOSIS — N2581 Secondary hyperparathyroidism of renal origin: Secondary | ICD-10-CM | POA: Diagnosis not present

## 2018-06-25 DIAGNOSIS — D509 Iron deficiency anemia, unspecified: Secondary | ICD-10-CM | POA: Diagnosis not present

## 2018-06-25 DIAGNOSIS — R112 Nausea with vomiting, unspecified: Secondary | ICD-10-CM | POA: Diagnosis not present

## 2018-06-25 DIAGNOSIS — Z992 Dependence on renal dialysis: Secondary | ICD-10-CM | POA: Diagnosis not present

## 2018-06-25 DIAGNOSIS — N186 End stage renal disease: Secondary | ICD-10-CM | POA: Diagnosis not present

## 2018-06-25 NOTE — Progress Notes (Signed)
Virtual Visit via Telephone Note   This visit type was conducted due to national recommendations for restrictions regarding the COVID-19 Pandemic (e.g. social distancing) in an effort to limit this patient's exposure and mitigate transmission in our community.  Due to his co-morbid illnesses, this patient is at least at moderate risk for complications without adequate follow up.  This format is felt to be most appropriate for this patient at this time.  The patient did not have access to video technology/had technical difficulties with video requiring transitioning to audio format only (telephone).  All issues noted in this document were discussed and addressed.  No physical exam could be performed with this format.  Please refer to the patient's chart for his  consent to telehealth for [facility].   Date:  06/29/2018   ID:  Jack Irwin, DOB 1965/08/23, MRN [id number]  Patient Location: Home Provider Location: Home  PCP:  Lanae Boast, [place]  Cardiologist: Satira Sark, MD  Evaluation Performed:  Follow-Up Visit  Chief Complaint:   Low blood pressures with dialysis  History of Present Illness:    Jack Irwin is a 53 y.o. male seen in consultation back in January 2018 to establish follow-up in our anticoagulation clinic at a point when he was on Coumadin for treatment of right popliteal DVT.  He has been followed by PCP and also Dr. Oneida Alar, was switched to Eliquis which he is tolerating under the direction of his PCP.  He has no cardiac specific diagnoses that we have been following otherwise.  He is referred back to the office reportedly with intermittent low blood pressures dialysis sessions.  He has already been placed on midodrine by nephrology and states that he is tolerating this better.  He is otherwise not on any antihypertensive medications.  He is scheduled to undergo a left upper arm loop graft for hemodialysis access by Dr. Oneida Alar in early June.  No  specific indication for cardiac testing preoperatively.  He denies any angina symptoms, no palpitations or syncope.  I reviewed his recent tracing from March.  We went over his medications which are listed below.  Report any bleeding problems on Eliquis.  The patient does not have symptoms concerning for COVID-19 infection (fever, chills, cough, or new shortness of breath).    Past Medical History:  Diagnosis Date  . Anemia   . Arthritis   . BPH (benign prostatic hyperplasia)   . Chronic back pain   . Difficulty walking   . DVT (deep venous thrombosis) ([place])    Right popliteal DVT December 2017  . End-stage renal disease on hemodialysis (Jack Irwin)    Dr. Lowanda Foster  - dialysis M/WF  . Erectile dysfunction   . Essential hypertension   . Gout   . Neuropathy, diabetic ([place])   . Type 2 diabetes mellitus ([place])   . Venous (peripheral) insufficiency    Past Surgical History:  Procedure Laterality Date  . AV FISTULA PLACEMENT Left 03/09/2014   Procedure: INSERTION OF ARTERIOVENOUS (AV) GORE-TEX GRAFT ARM;  Surgeon: Angelia Mould, MD;  Location: [facility] OR;  Service: Vascular;  Laterality: Left;  . AV FISTULA PLACEMENT Right 05/06/2016   Procedure: INSERTION OF ARTERIOVENOUS (AV) GORE-TEX GRAFT  Right ARM;  Surgeon: Elam Dutch, MD;  Location: [place];  Service: Vascular;  Laterality: Right;  . AV FISTULA PLACEMENT Right 12/15/2017   Procedure: ARTERIOVENOUS (AV) BRACHIOCEPHALIC FISTULA CREATION;  Surgeon: Elam Dutch, MD;  Location: [place];  Service: Vascular;  Laterality: Right;  . CYST EXCISION Right    cyst removed on thumb 2001  . CYSTOSCOPY N/A 09/23/2017   Procedure: CYSTOSCOPY;  Surgeon: Cleon Gustin, MD;  Location: [facility];  Service: Urology;  Laterality: N/A;  . INSERTION OF DIALYSIS CATHETER    . INSERTION OF DIALYSIS CATHETER Left 03/09/2014   Procedure: INSERTION OF DIALYSIS CATHETER;  Surgeon: Angelia Mould, MD;  Location: [place];  Service: Vascular;  Laterality:  Left;  . IR THROMBECTOMY AV FISTULA W/THROMBOLYSIS/PTA INC/SHUNT/IMG RIGHT Right 11/01/2016  . IR US GUIDE VASC ACCESS RIGHT  11/01/2016  . IRRIGATION AND DEBRIDEMENT ABSCESS N/A 02/25/2016   Procedure: IRRIGATION AND DEBRIDEMENT SCROTAL ABSCESS;  Surgeon: Nickie Retort, MD;  Location: [facility];  Service: Urology;  Laterality: N/A;  . UPPER EXTREMITY VENOGRAPHY Left 04/08/2018   Procedure: UPPER EXTREMITY VENOGRAPHY;  Surgeon: Marty Heck, MD;  Location: [place] [facility];  Service: Cardiovascular;  Laterality: Left;     Current Meds  Medication Sig  . acetaminophen (TYLENOL) 500 MG tablet Take 1,000 mg by mouth every 6 (six) hours as needed for mild pain.   [name] ammonium lactate (AMLACTIN) 12 % lotion Apply 1 application topically as needed for dry skin.  . clotrimazole (LOTRIMIN) 1 % cream Apply to both feet and between toes twice daily  . clotrimazole-betamethasone (LOTRISONE) cream Apply 1 application topically 2 (two) times daily as needed (boils).  . colchicine 0.6 MG tablet Take 0.5 tablets (0.3 mg total) by mouth daily as needed (for gout).  [name] ELIQUIS 2.5 MG TABS tablet TAKE ONE TABLET BY MOUTH 2 TIMES A DAY.  . finasteride (PROSCAR) 5 MG tablet Take 5 mg by mouth daily.   [name] gabapentin (NEURONTIN) 100 MG capsule Take 1 capsule (100 mg total) by mouth daily.  . midodrine (PROAMATINE) 10 MG tablet 10 mg. 1 tablet by mouth 45 minutes before dialysis  . sevelamer carbonate (RENVELA) 800 MG tablet Take 2-4 tablets (1,600-3,200 mg total) by mouth See admin instructions. Take 3200 mg with each meal and take 1600 mg with each snack  . sildenafil (REVATIO) 20 MG tablet DISSOLVE 1/2 TROCHE UNDER THE TONGUE 60 MINUTES PRIOR TO SEXUAL ENCOUNTER. (Patient taking differently: Take 20 mg by mouth See admin instructions. DISSOLVE 1/2 TROCHE UNDER THE TONGUE 60 MINUTES PRIOR TO SEXUAL ENCOUNTER.)  . ULORIC 40 MG tablet Take 1 tablet (40 mg total) by mouth daily.     Allergies:   Doxycycline    Social History   Tobacco Use  . Smoking status: Never Smoker  . Smokeless tobacco: Never Used  Substance Use Topics  . Alcohol use: No    Alcohol/week: 0.0 standard drinks  . Drug use: No     Family Hx: The patient's family history includes Cancer in his father; Diabetes in his father and mother; Heart attack in his mother; Heart failure in his mother; Hyperlipidemia in his father and mother; Hypertension in his father and mother.  ROS:   Please see the history of present illness.    All other systems reviewed and are negative.   Prior CV studies:   The following studies were reviewed today:  No recent cardiac testing.  Labs/Other Tests and Data Reviewed:    EKG:  An ECG dated 04/08/2018 was personally reviewed today and demonstrated:  Sinus arrhythmia with right bundle branch block.  Recent Labs: 08/01/2017: ALT 44; Platelets 108 09/23/2017: BUN 50; Creatinine, Ser 16.70 04/08/2018: Hemoglobin 13.9; Potassium 4.4; Sodium 137   Recent  Lipid Panel Lab Results  Component Value Date/Time   CHOL 127 11/06/2016 09:24 AM   TRIG 99 11/06/2016 09:24 AM   HDL 35 (L) 11/06/2016 09:24 AM   CHOLHDL 3.6 11/06/2016 09:24 AM   LDLCALC 74 11/06/2016 09:24 AM    Wt Readings from Last 3 Encounters:  06/29/18 261 lb (118.4 kg)  06/17/18 261 lb (118.4 kg)  05/13/18 259 lb (117.5 kg)     Objective:    Vital Signs:  Ht 5\' 11"  (1.803 m)   Wt 261 lb (118.4 kg)   BMI 36.40 kg/m    Patient did not have a way to check his vital signs today.  I note that his recent blood pressure was 92/70 recorded on May 14. Patient spoke in full sentences on the phone.  Not short of breath. No audible wheezing. Speech pattern normal.  ASSESSMENT & PLAN:    1.  Reported low blood pressures with hemodialysis, unlikely to be cardiac in etiology.  Agree with addition of midodrine as per nephrology.  He is currently not on any antihypertensive medications.  If this remains a persistent issue,  echocardiogram can be obtained.  2.  History of recurrent DVT, he is on renally dosed Eliquis which is followed by PCP.  3.  ESRD on hemodialysis.  He follows with Dr. Lowanda Foster.  4.  Plan for left upper arm loop graft for hemodialysis access, surgery scheduled soon by Dr. Oneida Alar.  No specific cardiac contraindication to proceed at this point.  COVID-19 Education: The signs and symptoms of COVID-19 were discussed with the patient and how to seek care for testing (follow up with PCP or arrange E-visit).  The importance of social distancing was discussed today.  Time:   Today, I have spent 6 minutes with the patient with telehealth technology discussing the above problems.     Medication Adjustments/Labs and Tests Ordered: Current medicines are reviewed at length with the patient today.  Concerns regarding medicines are outlined above.   Tests Ordered: No orders of the defined types were placed in this encounter.   Medication Changes: No orders of the defined types were placed in this encounter.   Disposition:  Follow up 3 months in the [place] office.  Signed, Rozann Lesches, MD  06/29/2018 10:05 AM    [place]

## 2018-06-28 DIAGNOSIS — D509 Iron deficiency anemia, unspecified: Secondary | ICD-10-CM | POA: Diagnosis not present

## 2018-06-28 DIAGNOSIS — N2581 Secondary hyperparathyroidism of renal origin: Secondary | ICD-10-CM | POA: Diagnosis not present

## 2018-06-28 DIAGNOSIS — N186 End stage renal disease: Secondary | ICD-10-CM | POA: Diagnosis not present

## 2018-06-28 DIAGNOSIS — R112 Nausea with vomiting, unspecified: Secondary | ICD-10-CM | POA: Diagnosis not present

## 2018-06-28 DIAGNOSIS — Z992 Dependence on renal dialysis: Secondary | ICD-10-CM | POA: Diagnosis not present

## 2018-06-29 ENCOUNTER — Telehealth (INDEPENDENT_AMBULATORY_CARE_PROVIDER_SITE_OTHER): Payer: Medicare Other | Admitting: Cardiology

## 2018-06-29 ENCOUNTER — Encounter: Payer: Self-pay | Admitting: Cardiology

## 2018-06-29 VITALS — Ht 71.0 in | Wt 261.0 lb

## 2018-06-29 DIAGNOSIS — Z01810 Encounter for preprocedural cardiovascular examination: Secondary | ICD-10-CM | POA: Diagnosis not present

## 2018-06-29 DIAGNOSIS — I959 Hypotension, unspecified: Secondary | ICD-10-CM

## 2018-06-29 DIAGNOSIS — N186 End stage renal disease: Secondary | ICD-10-CM

## 2018-06-29 DIAGNOSIS — Z7189 Other specified counseling: Secondary | ICD-10-CM | POA: Diagnosis not present

## 2018-06-29 DIAGNOSIS — I82409 Acute embolism and thrombosis of unspecified deep veins of unspecified lower extremity: Secondary | ICD-10-CM

## 2018-06-29 DIAGNOSIS — Z992 Dependence on renal dialysis: Secondary | ICD-10-CM

## 2018-06-29 DIAGNOSIS — Z86718 Personal history of other venous thrombosis and embolism: Secondary | ICD-10-CM

## 2018-06-29 NOTE — Patient Instructions (Signed)
Medication Instructions: Your physician recommends that you continue on your current medications as directed. Please refer to the Current Medication list given to you today.   Labwork: None today  Procedures/Testing: None today  Follow-Up: 3 months with [place] PA-C  Any Additional Special Instructions Will Be Listed Below (If Applicable).     If you need a refill on your cardiac medications before your next appointment, please call your pharmacy.

## 2018-06-30 DIAGNOSIS — R112 Nausea with vomiting, unspecified: Secondary | ICD-10-CM | POA: Diagnosis not present

## 2018-06-30 DIAGNOSIS — D509 Iron deficiency anemia, unspecified: Secondary | ICD-10-CM | POA: Diagnosis not present

## 2018-06-30 DIAGNOSIS — N2581 Secondary hyperparathyroidism of renal origin: Secondary | ICD-10-CM | POA: Diagnosis not present

## 2018-06-30 DIAGNOSIS — Z992 Dependence on renal dialysis: Secondary | ICD-10-CM | POA: Diagnosis not present

## 2018-06-30 DIAGNOSIS — N186 End stage renal disease: Secondary | ICD-10-CM | POA: Diagnosis not present

## 2018-07-02 DIAGNOSIS — Z992 Dependence on renal dialysis: Secondary | ICD-10-CM | POA: Diagnosis not present

## 2018-07-02 DIAGNOSIS — R112 Nausea with vomiting, unspecified: Secondary | ICD-10-CM | POA: Diagnosis not present

## 2018-07-02 DIAGNOSIS — D509 Iron deficiency anemia, unspecified: Secondary | ICD-10-CM | POA: Diagnosis not present

## 2018-07-02 DIAGNOSIS — N2581 Secondary hyperparathyroidism of renal origin: Secondary | ICD-10-CM | POA: Diagnosis not present

## 2018-07-02 DIAGNOSIS — N186 End stage renal disease: Secondary | ICD-10-CM | POA: Diagnosis not present

## 2018-07-04 DIAGNOSIS — Z992 Dependence on renal dialysis: Secondary | ICD-10-CM | POA: Diagnosis not present

## 2018-07-04 DIAGNOSIS — N186 End stage renal disease: Secondary | ICD-10-CM | POA: Diagnosis not present

## 2018-07-05 ENCOUNTER — Encounter (HOSPITAL_COMMUNITY): Payer: Self-pay | Admitting: *Deleted

## 2018-07-05 ENCOUNTER — Other Ambulatory Visit: Payer: Self-pay

## 2018-07-05 DIAGNOSIS — D509 Iron deficiency anemia, unspecified: Secondary | ICD-10-CM | POA: Diagnosis not present

## 2018-07-05 DIAGNOSIS — N2581 Secondary hyperparathyroidism of renal origin: Secondary | ICD-10-CM | POA: Diagnosis not present

## 2018-07-05 DIAGNOSIS — N186 End stage renal disease: Secondary | ICD-10-CM | POA: Diagnosis not present

## 2018-07-05 DIAGNOSIS — Z992 Dependence on renal dialysis: Secondary | ICD-10-CM | POA: Diagnosis not present

## 2018-07-05 DIAGNOSIS — D631 Anemia in chronic kidney disease: Secondary | ICD-10-CM | POA: Diagnosis not present

## 2018-07-05 DIAGNOSIS — R112 Nausea with vomiting, unspecified: Secondary | ICD-10-CM | POA: Diagnosis not present

## 2018-07-05 MED ORDER — DEXTROSE 5 % IV SOLN
3.0000 g | INTRAVENOUS | Status: AC
  Administered 2018-07-06: 08:00:00 3 g via INTRAVENOUS
  Filled 2018-07-05: qty 3
  Filled 2018-07-05: qty 3000

## 2018-07-05 NOTE — Anesthesia Preprocedure Evaluation (Addendum)
Anesthesia Evaluation  Patient identified by MRN, date of birth, ID band Patient awake    Reviewed: Allergy & Precautions, NPO status , Patient's Chart, lab work & pertinent test results  Airway Mallampati: III  TM Distance: >3 FB Neck ROM: Full    Dental  (+) Missing,    Pulmonary neg pulmonary ROS,    Pulmonary exam normal        Cardiovascular hypertension, + DVT  Normal cardiovascular exam  ECG: SR, rate 68. Normal sinus rhythm with sinus arrhythmia. Right bundle branch block   Neuro/Psych Neuropathy, diabetic negative psych ROS   GI/Hepatic negative GI ROS, Neg liver ROS,   Endo/Other  negative endocrine ROSdiabetes  Renal/GU Dialysis and ESRFRenal diseaseOn HD M, W, F     Musculoskeletal Gout Chronic back pain   Abdominal (+) + obese,   Peds  Hematology negative hematology ROS (+)   Anesthesia Other Findings END STAGE RENAL DISEASE FOR HEMODIALYSIS ACCESS  Reproductive/Obstetrics                          Anesthesia Physical Anesthesia Plan  ASA: IV  Anesthesia Plan: MAC   Post-op Pain Management:    Induction:   PONV Risk Score and Plan: 1 and Propofol infusion and Treatment may vary due to age or medical condition  Airway Management Planned: Simple Face Mask  Additional Equipment:   Intra-op Plan:   Post-operative Plan:   Informed Consent: I have reviewed the patients History and Physical, chart, labs and discussed the procedure including the risks, benefits and alternatives for the proposed anesthesia with the patient or authorized representative who has indicated his/her understanding and acceptance.     Dental advisory given  Plan Discussed with: CRNA  Anesthesia Plan Comments: (Per PA: Saw cardiology recently for eval of hypotension with HD, treated by nephrology with midodrine. Per telehealth visit with Dr. Domenic Polite 06/29/18 "He is scheduled to undergo a left  upper arm loop graft for hemodialysis access by Dr. Oneida Alar in early June.  No specific indication for cardiac testing preoperatively.  He denies any angina symptoms, no palpitations or syncope.  I reviewed his recent tracing from March.Marland KitchenMarland KitchenNo specific cardiac contraindication to proceed at this point.")     Anesthesia Quick Evaluation

## 2018-07-05 NOTE — Progress Notes (Signed)
Jack Irwin reports "shortness of breath at times, when walking a lot".  Jack Irwin states that he has chest pain at times, "sometimes the right side, pain score of 5, it last a few minutes up to a minute."  Jack Irwin reports that pain is sharp and it can start when he is setting. Jack Irwin reports that pain goes away on its own. Jack Irwin has low pressure on dialysis he has been started on Midodrine on dialysis days. Jack Irwin is followed by Dr. Domenic Polite, cardiologist and PCP is at [facility].  Jack Necaise reports that he does not have a CBG machine.  Last A1C was 5.0- 03/10/2018

## 2018-07-06 ENCOUNTER — Encounter (HOSPITAL_COMMUNITY)
Admission: RE | Disposition: A | Discharge status: Home / Self Care | Payer: Self-pay | Source: Home / Self Care | Attending: Vascular Surgery

## 2018-07-06 ENCOUNTER — Ambulatory Visit (HOSPITAL_COMMUNITY): Payer: Medicare Other | Admitting: Physician Assistant

## 2018-07-06 ENCOUNTER — Ambulatory Visit (HOSPITAL_COMMUNITY)
Admission: RE | Admit: 2018-07-06 | Discharge: 2018-07-06 | Disposition: A | Discharge status: Home / Self Care | Payer: Medicare Other | Attending: Vascular Surgery | Admitting: Vascular Surgery

## 2018-07-06 ENCOUNTER — Other Ambulatory Visit: Payer: Self-pay

## 2018-07-06 ENCOUNTER — Encounter (HOSPITAL_COMMUNITY): Payer: Self-pay | Admitting: Certified Registered"

## 2018-07-06 DIAGNOSIS — Z86718 Personal history of other venous thrombosis and embolism: Secondary | ICD-10-CM | POA: Diagnosis not present

## 2018-07-06 DIAGNOSIS — M1990 Unspecified osteoarthritis, unspecified site: Secondary | ICD-10-CM | POA: Insufficient documentation

## 2018-07-06 DIAGNOSIS — Z881 Allergy status to other antibiotic agents status: Secondary | ICD-10-CM | POA: Insufficient documentation

## 2018-07-06 DIAGNOSIS — N186 End stage renal disease: Secondary | ICD-10-CM | POA: Insufficient documentation

## 2018-07-06 DIAGNOSIS — E1140 Type 2 diabetes mellitus with diabetic neuropathy, unspecified: Secondary | ICD-10-CM | POA: Insufficient documentation

## 2018-07-06 DIAGNOSIS — I120 Hypertensive chronic kidney disease with stage 5 chronic kidney disease or end stage renal disease: Secondary | ICD-10-CM | POA: Diagnosis not present

## 2018-07-06 DIAGNOSIS — M109 Gout, unspecified: Secondary | ICD-10-CM | POA: Insufficient documentation

## 2018-07-06 DIAGNOSIS — N185 Chronic kidney disease, stage 5: Secondary | ICD-10-CM | POA: Diagnosis not present

## 2018-07-06 DIAGNOSIS — Z79899 Other long term (current) drug therapy: Secondary | ICD-10-CM | POA: Insufficient documentation

## 2018-07-06 DIAGNOSIS — Z7901 Long term (current) use of anticoagulants: Secondary | ICD-10-CM | POA: Diagnosis not present

## 2018-07-06 DIAGNOSIS — E1122 Type 2 diabetes mellitus with diabetic chronic kidney disease: Secondary | ICD-10-CM | POA: Insufficient documentation

## 2018-07-06 DIAGNOSIS — N400 Enlarged prostate without lower urinary tract symptoms: Secondary | ICD-10-CM | POA: Diagnosis not present

## 2018-07-06 DIAGNOSIS — Z992 Dependence on renal dialysis: Secondary | ICD-10-CM | POA: Insufficient documentation

## 2018-07-06 DIAGNOSIS — Z1159 Encounter for screening for other viral diseases: Secondary | ICD-10-CM | POA: Diagnosis not present

## 2018-07-06 HISTORY — PX: AV FISTULA PLACEMENT: SHX1204

## 2018-07-06 HISTORY — DX: Personal history of other medical treatment: Z92.89

## 2018-07-06 LAB — SARS CORONAVIRUS 2 BY RT PCR (HOSPITAL ORDER, PERFORMED IN ~~LOC~~ HOSPITAL LAB): SARS Coronavirus 2: NEGATIVE

## 2018-07-06 LAB — POCT I-STAT 4, (NA,K, GLUC, HGB,HCT)
Glucose, Bld: 78 mg/dL (ref 70–99)
HCT: 48 % (ref 39.0–52.0)
Hemoglobin: 16.3 g/dL (ref 13.0–17.0)
Potassium: 4 mmol/L (ref 3.5–5.1)
Sodium: 133 mmol/L — ABNORMAL LOW (ref 135–145)

## 2018-07-06 LAB — GLUCOSE, CAPILLARY
Glucose-Capillary: 89 mg/dL (ref 70–99)
Glucose-Capillary: 97 mg/dL (ref 70–99)

## 2018-07-06 SURGERY — INSERTION OF ARTERIOVENOUS (AV) GORE-TEX GRAFT ARM
Anesthesia: Monitor Anesthesia Care | Laterality: Left

## 2018-07-06 MED ORDER — PROTAMINE SULFATE 10 MG/ML IV SOLN
INTRAVENOUS | Status: DC | PRN
  Administered 2018-07-06: 50 mg via INTRAVENOUS

## 2018-07-06 MED ORDER — OXYCODONE HCL 5 MG PO TABS
ORAL_TABLET | ORAL | Status: AC
  Filled 2018-07-06: qty 1

## 2018-07-06 MED ORDER — OXYCODONE HCL 5 MG PO TABS: 5.0000 mg | ORAL_TABLET | Freq: Four times a day (QID) | ORAL | 0 refills | Status: AC | PRN

## 2018-07-06 MED ORDER — SODIUM CHLORIDE 0.9 % IV SOLN
INTRAVENOUS | Status: AC
  Filled 2018-07-06: qty 1.2

## 2018-07-06 MED ORDER — SODIUM CHLORIDE 0.9 % IV SOLN
INTRAVENOUS | Status: DC | PRN
  Administered 2018-07-06: 08:00:00 via INTRAVENOUS

## 2018-07-06 MED ORDER — FENTANYL CITRATE (PF) 100 MCG/2ML IJ SOLN
INTRAMUSCULAR | Status: AC
  Filled 2018-07-06: qty 2

## 2018-07-06 MED ORDER — OXYCODONE HCL 5 MG PO TABS
5.0000 mg | ORAL_TABLET | Freq: Four times a day (QID) | ORAL | Status: DC | PRN
  Administered 2018-07-06: 5 mg via ORAL

## 2018-07-06 MED ORDER — PROPOFOL 10 MG/ML IV BOLUS
INTRAVENOUS | Status: AC
  Filled 2018-07-06: qty 20

## 2018-07-06 MED ORDER — PROTAMINE SULFATE 10 MG/ML IV SOLN
INTRAVENOUS | Status: AC
  Filled 2018-07-06: qty 5

## 2018-07-06 MED ORDER — MIDAZOLAM HCL 2 MG/2ML IJ SOLN
INTRAMUSCULAR | Status: AC
  Filled 2018-07-06: qty 2

## 2018-07-06 MED ORDER — SODIUM CHLORIDE 0.9 % IV SOLN: INTRAVENOUS | Status: DC

## 2018-07-06 MED ORDER — SODIUM CHLORIDE 0.9 % IV SOLN
INTRAVENOUS | Status: DC | PRN
  Administered 2018-07-06: 500 mL

## 2018-07-06 MED ORDER — PROPOFOL 1000 MG/100ML IV EMUL
INTRAVENOUS | Status: AC
  Filled 2018-07-06: qty 100

## 2018-07-06 MED ORDER — LIDOCAINE HCL (PF) 1 % IJ SOLN
INTRAMUSCULAR | Status: AC
  Filled 2018-07-06: qty 30

## 2018-07-06 MED ORDER — FENTANYL CITRATE (PF) 100 MCG/2ML IJ SOLN
INTRAMUSCULAR | Status: DC | PRN
  Administered 2018-07-06: 50 ug via INTRAVENOUS
  Administered 2018-07-06 (×2): 25 ug via INTRAVENOUS
  Administered 2018-07-06: 50 ug via INTRAVENOUS
  Administered 2018-07-06 (×2): 25 ug via INTRAVENOUS

## 2018-07-06 MED ORDER — MIDAZOLAM HCL 2 MG/2ML IJ SOLN
INTRAMUSCULAR | Status: DC | PRN
  Administered 2018-07-06: 2 mg via INTRAVENOUS

## 2018-07-06 MED ORDER — HEPARIN SODIUM (PORCINE) 1000 UNIT/ML IJ SOLN
INTRAMUSCULAR | Status: AC
  Filled 2018-07-06: qty 1

## 2018-07-06 MED ORDER — ACETAMINOPHEN 500 MG PO TABS
500.0000 mg | ORAL_TABLET | Freq: Once | ORAL | Status: AC
  Administered 2018-07-06: 500 mg via ORAL
  Filled 2018-07-06: qty 1

## 2018-07-06 MED ORDER — FENTANYL CITRATE (PF) 250 MCG/5ML IJ SOLN
INTRAMUSCULAR | Status: AC
  Filled 2018-07-06: qty 5

## 2018-07-06 MED ORDER — ONDANSETRON HCL 4 MG/2ML IJ SOLN: 4.0000 mg | Freq: Once | INTRAMUSCULAR | Status: DC | PRN

## 2018-07-06 MED ORDER — CHLORHEXIDINE GLUCONATE 4 % EX LIQD: 60.0000 mL | Freq: Once | CUTANEOUS | Status: DC

## 2018-07-06 MED ORDER — MICROFIBRILLAR COLL HEMOSTAT EX PADS
MEDICATED_PAD | CUTANEOUS | Status: DC | PRN
  Administered 2018-07-06: 1 via TOPICAL

## 2018-07-06 MED ORDER — FENTANYL CITRATE (PF) 100 MCG/2ML IJ SOLN: 25.0000 ug | INTRAMUSCULAR | Status: DC | PRN

## 2018-07-06 MED ORDER — LIDOCAINE HCL (PF) 1 % IJ SOLN
INTRAMUSCULAR | Status: DC | PRN
  Administered 2018-07-06: 21 mL
  Administered 2018-07-06: 30 mL

## 2018-07-06 MED ORDER — 0.9 % SODIUM CHLORIDE (POUR BTL) OPTIME
TOPICAL | Status: DC | PRN
  Administered 2018-07-06: 1000 mL

## 2018-07-06 MED ORDER — HEPARIN SODIUM (PORCINE) 1000 UNIT/ML IJ SOLN
INTRAMUSCULAR | Status: DC | PRN
  Administered 2018-07-06: 7000 [IU] via INTRAVENOUS

## 2018-07-06 MED ORDER — LIDOCAINE 2% (20 MG/ML) 5 ML SYRINGE
INTRAMUSCULAR | Status: DC | PRN
  Administered 2018-07-06: 40 mg via INTRAVENOUS

## 2018-07-06 MED ORDER — PROPOFOL 500 MG/50ML IV EMUL
INTRAVENOUS | Status: DC | PRN
  Administered 2018-07-06: 50 ug/kg/min via INTRAVENOUS

## 2018-07-06 SURGICAL SUPPLY — 38 items
ADH SKN CLS APL DERMABOND .7 (GAUZE/BANDAGES/DRESSINGS) ×1
AGENT HMST SPONGE THK3/8 (HEMOSTASIS) ×1
ARMBAND PINK RESTRICT EXTREMIT (MISCELLANEOUS) ×4 IMPLANT
CANISTER SUCT 3000ML PPV (MISCELLANEOUS) ×2 IMPLANT
CANNULA VESSEL 3MM 2 BLNT TIP (CANNULA) ×2 IMPLANT
CLIP VESOCCLUDE MED 6/CT (CLIP) ×2 IMPLANT
CLIP VESOCCLUDE SM WIDE 6/CT (CLIP) ×2 IMPLANT
COVER WAND RF STERILE (DRAPES) ×1 IMPLANT
DECANTER SPIKE VIAL GLASS SM (MISCELLANEOUS) ×3 IMPLANT
DERMABOND ADVANCED (GAUZE/BANDAGES/DRESSINGS) ×1
DERMABOND ADVANCED .7 DNX12 (GAUZE/BANDAGES/DRESSINGS) ×1 IMPLANT
ELECT REM PT RETURN 9FT ADLT (ELECTROSURGICAL) ×2
ELECTRODE REM PT RTRN 9FT ADLT (ELECTROSURGICAL) ×1 IMPLANT
GLOVE BIO SURGEON STRL SZ 6 (GLOVE) ×1 IMPLANT
GLOVE BIO SURGEON STRL SZ7.5 (GLOVE) ×2 IMPLANT
GLOVE BIOGEL PI IND STRL 6.5 (GLOVE) IMPLANT
GLOVE BIOGEL PI INDICATOR 6.5 (GLOVE) ×1
GOWN STRL REUS W/ TWL LRG LVL3 (GOWN DISPOSABLE) ×3 IMPLANT
GOWN STRL REUS W/TWL LRG LVL3 (GOWN DISPOSABLE) ×8
GRAFT GORETEX STRT 4-7X45 (Vascular Products) IMPLANT
GRAFT GORETEX STRT 6X50 (Vascular Products) ×1 IMPLANT
HEMOSTAT SPONGE AVITENE ULTRA (HEMOSTASIS) ×1 IMPLANT
KIT BASIN OR (CUSTOM PROCEDURE TRAY) ×2 IMPLANT
KIT TURNOVER KIT B (KITS) ×2 IMPLANT
NS IRRIG 1000ML POUR BTL (IV SOLUTION) ×2 IMPLANT
PACK CV ACCESS (CUSTOM PROCEDURE TRAY) ×2 IMPLANT
PAD ARMBOARD 7.5X6 YLW CONV (MISCELLANEOUS) ×4 IMPLANT
SUT MNCRL AB 4-0 PS2 18 (SUTURE) IMPLANT
SUT PROLENE 6 0 CC (SUTURE) ×4 IMPLANT
SUT PROLENE 7 0 BV 1 (SUTURE) ×1 IMPLANT
SUT VIC AB 3-0 SH 27 (SUTURE) ×4
SUT VIC AB 3-0 SH 27X BRD (SUTURE) ×2 IMPLANT
SUT VIC AB 4-0 PS2 18 (SUTURE) ×1 IMPLANT
SUT VICRYL 4-0 PS2 18IN ABS (SUTURE) ×3 IMPLANT
SYR TOOMEY 50ML (SYRINGE) IMPLANT
TOWEL GREEN STERILE (TOWEL DISPOSABLE) ×2 IMPLANT
UNDERPAD 30X30 (UNDERPADS AND DIAPERS) ×2 IMPLANT
WATER STERILE IRR 1000ML POUR (IV SOLUTION) ×2 IMPLANT

## 2018-07-06 NOTE — Transfer of Care (Signed)
Immediate Anesthesia Transfer of Care Note  Patient: JD MCCASTER  Procedure(s) Performed: INSERTION OF ARTERIOVENOUS LOOP GRAFT LEFT UPPER ARM (Left )  Patient Location: PACU  Anesthesia Type:MAC  Level of Consciousness: drowsy and patient cooperative  Airway & Oxygen Therapy: Patient Spontanous Breathing  Post-op Assessment: Report given to RN  Post vital signs: Reviewed and stable  Last Vitals:  Vitals Value Taken Time  BP    Temp    Pulse    Resp 17 07/06/2018 10:41 AM  SpO2    Vitals shown include unvalidated device data.  Last Pain:  Vitals:   07/06/18 0703  TempSrc:   PainSc: 0-No pain      Patients Stated Pain Goal: 0 (95/32/02 3343)  Complications: No apparent anesthesia complications

## 2018-07-06 NOTE — Op Note (Signed)
Procedure: Left upper arm loop graft  Preoperative diagnosis: End-stage renal disease  Postoperative diagnosis: Same  Anesthesia: Local with IV sedation  Assistant: Liana Crocker, PA-C  Operative findings: 7 mm axillary vein and the side  2-3 mm artery high brachial bifurcation  Operative details: After obtaining form consent, patient taken the operating.  The patient was placed supine position operating table.  After adequate sedation patient's entire left upper extremities prepped and draped in usual sterile fashion.  Local anesthesia was infiltrated up near the left axilla.  Longitudinal incision was made in this location carried down through subcutaneous tissues down the level of a pre-existing vein which had a stent within it.  I was able to have dissection of 2 other veins that I found in the axilla.  1 of these was about 4 mm in diameter.  There was a deeper larger caliber vein which appeared to be the axillary vein and was about 7 mm in diameter.  Dissection was fairly tedious due to previous scar tissue and multiple nerves in the upper arm which had to be teased away from the vessels.  The patient had a known high brachial bifurcation.  I dissected out the artery and it was about 3 mm in diameter shrinking to about 2 mm in diameter with spasm.  I was unable to find any other large arterial branch.  Next a subcutaneous tunnel was created in a loop configuration in the upper arm with the venous limb of the graft over the biceps and the arterial limb of the graft on the inner aspect.  A transverse incision was made in the distal upper arm for assistance and tunneling.  A 6 mm PTFE stretch standard wall graft was used.  Patient was given 7000 units of intravenous heparin.  The artery was controlled proximally distally with Vesseloops.  Longitudinal opening was made in the artery the graft was beveled and sewn endograft to side of artery using a running 6-0 Prolene suture.  Prior to completion  anastomosis it was for blood backbled and thoroughly flushed reanastomosed was secured Vesseloops released there is pulsatile flow in the graft immediately.  7-0 Prolene was used to place one repair stitch.  The graft was then clamped on its distal end.  The vein was controlled proximally distally with fine bulldog clamps.  Longitudinal opening was made in the vein.  The graft was cut to length and beveled.  It was then sewn endograft to side of vein using a running 6-0 Prolene suture.  Despite completion anastomosis it was for blood backbled and thoroughly flushed.  Anastomosis was secured clamps released there is palpable thrill in the graft immediately.  Hemostasis was obtained with Avitene direct pressure and 50 mg of protamine.  Patient did not have a palpable radial pulse at the conclusion the procedure.  He did have biphasic to triphasic Doppler flow in the radial area of his wrist with compression of the graft.  After hemostasis was obtained, both incisions were closed with running 3-0 Vicryl followed by 4-0 Vicryl subcuticular stitch.  Dermabond was applied.  Ruta Hinds, MD [facility] Office: [phone] Pager: [phone]

## 2018-07-06 NOTE — Progress Notes (Signed)
Per Dr. Roanna Banning, PT-INR does not need to be collected at this time.

## 2018-07-06 NOTE — Anesthesia Postprocedure Evaluation (Signed)
Anesthesia Post Note  Patient: Jack Irwin  Procedure(s) Performed: INSERTION OF ARTERIOVENOUS LOOP GRAFT LEFT UPPER ARM (Left )     Patient location during evaluation: PACU Anesthesia Type: MAC Level of consciousness: awake and alert Pain management: pain level controlled Vital Signs Assessment: post-procedure vital signs reviewed and stable Respiratory status: spontaneous breathing, nonlabored ventilation, respiratory function stable and patient connected to nasal cannula oxygen Cardiovascular status: stable and blood pressure returned to baseline Postop Assessment: no apparent nausea or vomiting Anesthetic complications: no    Last Vitals:  Vitals:   07/06/18 1107 07/06/18 1115  BP:  (!) 143/94  Pulse:  91  Resp: 19 20  Temp:  36.8 C  SpO2:  99%    Last Pain:  Vitals:   07/06/18 1115  TempSrc:   PainSc: 8                  Ryan P Ellender

## 2018-07-06 NOTE — H&P (Signed)
Patient name: Jack Irwin   MRN: [id number]        DOB: 04/24/1965          Sex: male  REASON FOR CONSULT: Hemodialysis access  HPI: Jack Irwin is a 53 y.o. male has had multiple prior failed upper extremity access procedures.  He has previously had bilateral upper arm grafts as well as a failed right brachiocephalic AV fistula.  He recently underwent a central venogram on the left side which showed he had a patent axillary and central system.  He has previously had a left upper arm AV graft placed by Dr. Doren Custard in 2016.  This is been occluded for quite some time.  There is a stent in the outflow.  Dr. Doren Custard placed an upper arm loop because at the time of his graft placement he had a high brachial bifurcation.  The artery was also noted to be small in the axilla.  The patient is currently dialyzing via right sided catheter.  His dialysis today is Monday Wednesday Friday.  Other medical problems include neuropathy, diabetes, hypertension all of which are currently stable.  He is on Eliquis for a prior DVT.      Past Medical History:  Diagnosis Date  . Anemia   . Arthritis   . BPH (benign prostatic hyperplasia)   . Chronic back pain   . Difficulty walking   . DVT (deep venous thrombosis) ([place])    Right popliteal DVT December 2017  . End-stage renal disease on hemodialysis ([place])    Dr. Lowanda Foster  - dialysis M/WF  . Erectile dysfunction   . Essential hypertension   . Gout   . Neuropathy, diabetic ([place])   . Type 2 diabetes mellitus ([place])   . Venous (peripheral) insufficiency         Past Surgical History:  Procedure Laterality Date  . AV FISTULA PLACEMENT Left 03/09/2014   Procedure: INSERTION OF ARTERIOVENOUS (AV) GORE-TEX GRAFT ARM;  Surgeon: Angelia Mould, MD;  Location: [facility] OR;  Service: Vascular;  Laterality: Left;  . AV FISTULA PLACEMENT Right 05/06/2016   Procedure: INSERTION OF ARTERIOVENOUS (AV) GORE-TEX GRAFT  Right ARM;  Surgeon:  Elam Dutch, MD;  Location: [facility];  Service: Vascular;  Laterality: Right;  . AV FISTULA PLACEMENT Right 12/15/2017   Procedure: ARTERIOVENOUS (AV) BRACHIOCEPHALIC FISTULA CREATION;  Surgeon: Elam Dutch, MD;  Location: [facility];  Service: Vascular;  Laterality: Right;  . CYST EXCISION Right    cyst removed on thumb 2001  . CYSTOSCOPY N/A 09/23/2017   Procedure: CYSTOSCOPY;  Surgeon: Cleon Gustin, MD;  Location: [facility];  Service: Urology;  Laterality: N/A;  . INSERTION OF DIALYSIS CATHETER    . INSERTION OF DIALYSIS CATHETER Left 03/09/2014   Procedure: INSERTION OF DIALYSIS CATHETER;  Surgeon: Angelia Mould, MD;  Location: [facility];  Service: Vascular;  Laterality: Left;  . IR THROMBECTOMY AV FISTULA W/THROMBOLYSIS/PTA INC/SHUNT/IMG RIGHT Right 11/01/2016  . IR US GUIDE VASC ACCESS RIGHT  11/01/2016  . IRRIGATION AND DEBRIDEMENT ABSCESS N/A 02/25/2016   Procedure: IRRIGATION AND DEBRIDEMENT SCROTAL ABSCESS;  Surgeon: Nickie Retort, MD;  Location: [facility];  Service: Urology;  Laterality: N/A;  . UPPER EXTREMITY VENOGRAPHY Left 04/08/2018   Procedure: UPPER EXTREMITY VENOGRAPHY;  Surgeon: Marty Heck, MD;  Location: [facility];  Service: Cardiovascular;  Laterality: Left;         Family History  Problem Relation Age of Onset  .  Diabetes Mother   . Hypertension Mother   . Heart failure Mother   . Hyperlipidemia Mother   . Heart attack Mother   . Cancer Father   . Diabetes Father   . Hypertension Father   . Hyperlipidemia Father     SOCIAL HISTORY: Social History        Socioeconomic History  . Marital status: Married    Spouse name: Not on file  . Number of children: Not on file  . Years of education: Not on file  . Highest education level: Not on file  Occupational History  . Not on file  Social Needs  . Financial resource strain: Not on file  . Food insecurity:    Worry: Not on file    Inability: Not on  file  . Transportation needs:    Medical: Not on file    Non-medical: Not on file  Tobacco Use  . Smoking status: Never Smoker  . Smokeless tobacco: Never Used  Substance and Sexual Activity  . Alcohol use: No    Alcohol/week: 0.0 standard drinks  . Drug use: No  . Sexual activity: Not on file  Lifestyle  . Physical activity:    Days per week: Not on file    Minutes per session: Not on file  . Stress: Not on file  Relationships  . Social connections:    Talks on phone: Not on file    Gets together: Not on file    Attends religious service: Not on file    Active member of club or organization: Not on file    Attends meetings of clubs or organizations: Not on file    Relationship status: Not on file  . Intimate partner violence:    Fear of current or ex partner: Not on file    Emotionally abused: Not on file    Physically abused: Not on file    Forced sexual activity: Not on file  Other Topics Concern  . Not on file  Social History Narrative  . Not on file        Allergies  Allergen Reactions  . Doxycycline Itching          Current Outpatient Medications  Medication Sig Dispense Refill  . acetaminophen (TYLENOL) 500 MG tablet Take 1,000 mg by mouth every 6 (six) hours as needed for mild pain.     [name] ammonium lactate (AMLACTIN) 12 % lotion Apply 1 application topically as needed for dry skin. 400 g 0  . clotrimazole-betamethasone (LOTRISONE) cream Apply 1 application topically 2 (two) times daily as needed (boils).    . colchicine 0.6 MG tablet Take 0.5 tablets (0.3 mg total) by mouth daily as needed (for gout). 30 tablet 3  . ELIQUIS 2.5 MG TABS tablet TAKE ONE TABLET BY MOUTH 2 TIMES A DAY. 60 tablet 0  . finasteride (PROSCAR) 5 MG tablet Take 5 mg by mouth daily.     [name] gabapentin (NEURONTIN) 100 MG capsule Take 1 capsule (100 mg total) by mouth daily. 30 capsule 3  . Pseudoephedrine-APAP-DM (DAYQUIL PO) Take 1 tablet by mouth  every 6 (six) hours as needed (cold symptoms).    . sevelamer carbonate (RENVELA) 800 MG tablet Take 1 tablet (800 mg total) by mouth 2 (two) times daily between meals as needed (With snacks). (Patient taking differently: Take 1,600-3,200 mg by mouth See admin instructions. Take 3200 mg with each meal and take 1600 mg with each snack) 60 tablet 5  . sildenafil (REVATIO)  20 MG tablet DISSOLVE 1/2 TROCHE UNDER THE TONGUE 60 MINUTES PRIOR TO SEXUAL ENCOUNTER. (Patient taking differently: Take 20 mg by mouth See admin instructions. DISSOLVE 1/2 TROCHE UNDER THE TONGUE 60 MINUTES PRIOR TO SEXUAL ENCOUNTER.) 10 tablet 98  . traMADol (ULTRAM) 50 MG tablet Take 1 tablet (50 mg total) by mouth every 6 (six) hours as needed. 8 tablet 0  . ULORIC 40 MG tablet Take 1 tablet (40 mg total) by mouth daily. 30 tablet 5   No current facility-administered medications for this visit.     ROS:   General:  No weight loss, Fever, chills  Cardiac: No recent episodes of chest pain/pressure, no shortness of breath at rest.  No shortness of breath with exertion.  Denies history of atrial fibrillation or irregular heartbeat  Pulmonary: No home oxygen, no productive cough, no hemoptysis,  No asthma or wheezing   Physical Examination  Vitals:   07/06/18 0608 07/06/18 0705  BP: (!) 157/107 116/75  Pulse: 87   Temp: 97.7 F (36.5 C)   TempSrc: Oral   SpO2: 100%   Weight: 118.8 kg   Height: _0  (1.803 m)     General:  Alert and oriented, no acute distress Pulmonary: + Tunneled dialysis catheter Cardiac: Regular Rate and Rhythm Skin: No rash Extremity Pulses: Absent radial, 1+ high brachial pulses bilaterally, occluded bilateral upper arm grafts Musculoskeletal: No deformity or edema      Neurologic: Upper and lower extremity motor 5/5 and symmetric  DATA:  I reviewed the patient's recent central venogram.  He does have a patent axillary vein in the high axilla that would be a reasonable  outflow for an AV access.  ASSESSMENT: Left upper arm loop graft when we are allowed to schedule elective procedures in light of the recent COVID.   PLAN: Risk benefits possible complications and procedure details were discussed with the patient today about left upper arm AV graft placement.  This may be of limited durability if the artery is definitely small in the axilla.  However I believe this would be a better option then proceeding to a thigh graft first.  Patient was informed today that we are not currently scheduling elective procedures as long as someone has a functioning access.  He understands this.  As soon as we are allowed to schedule elective procedures at [facility] we will get him on the schedule for a left upper arm loop graft.  Patient prefers local with MAC anesthesia for this procedure due to concerns of his underlying cardiac status.  Ruta Hinds, MD [facility] Office: [phone] Pager: [phone]

## 2018-07-06 NOTE — Discharge Instructions (Signed)
° °  [facility]  Discharge Instructions  AV Fistula or Graft Surgery for Dialysis Access  Please refer to the following instructions for your post-procedure care. Your surgeon or physician assistant will discuss any changes with you.  Activity  You may drive the day following your surgery, if you are comfortable and no longer taking prescription pain medication. Resume full activity as the soreness in your incision resolves.  Bathing/Showering  You may shower after you go home. Keep your incision dry for 48 hours. Do not soak in a bathtub, hot tub, or swim until the incision heals completely. You may not shower if you have a hemodialysis catheter.  Incision Care  Clean your incision with mild soap and water after 48 hours. Pat the area dry with a clean towel. You do not need a bandage unless otherwise instructed. Do not apply any ointments or creams to your incision. You may have skin glue on your incision. Do not peel it off. It will come off on its own in about one week. Your arm may swell a bit after surgery. To reduce swelling use pillows to elevate your arm so it is above your heart. Your doctor will tell you if you need to lightly wrap your arm with an ACE bandage.  Diet  Resume your normal diet. There are not special food restrictions following this procedure. In order to heal from your surgery, it is CRITICAL to get adequate nutrition. Your body requires vitamins, minerals, and protein. Vegetables are the best source of vitamins and minerals. Vegetables also provide the perfect balance of protein. Processed food has little nutritional value, so try to avoid this.  Medications  Resume taking all of your medications. If your incision is causing pain, you may take over-the counter pain relievers such as acetaminophen (Tylenol). If you were prescribed a stronger pain medication, please be aware these medications can cause nausea and constipation. Prevent  nausea by taking the medication with a snack or meal. Avoid constipation by drinking plenty of fluids and eating foods with high amount of fiber, such as fruits, vegetables, and grains.  Do not take Tylenol if you are taking prescription pain medications.  Follow up Your surgeon may want to see you in the office following your access surgery. If so, this will be arranged at the time of your surgery.  Please call us immediately for any of the following conditions:  Increased pain, redness, drainage (pus) from your incision site Fever of 101 degrees or higher Severe or worsening pain at your incision site Hand pain or numbness.  Reduce your risk of vascular disease:  Stop smoking. If you would like help, call [facility] at 1-800-QUIT-NOW 909-260-7924) or [place] at [place] your cholesterol Maintain a desired weight Control your diabetes Keep your blood pressure down  Dialysis  It will take several weeks to several months for your new dialysis access to be ready for use. Your surgeon will determine when it is okay to use it. Your nephrologist will continue to direct your dialysis. You can continue to use your Permcath until your new access is ready for use.   07/06/2018 Jack Irwin [id number] 02/28/1965  Surgeon(s): Fields, Jessy Oto, MD  Procedure(s): INSERTION OF ARTERIOVENOUS LOOP GRAFT LEFT UPPER ARM  x Do not stick graft for 4 weeks    If you have any questions, please call the office at [phone].

## 2018-07-06 NOTE — Anesthesia Procedure Notes (Signed)
Procedure Name: MAC Date/Time: 07/06/2018 7:47 AM Performed by: Barrington Ellison, CRNA Pre-anesthesia Checklist: Patient identified, Emergency Drugs available, Suction available and Patient being monitored Patient Re-evaluated:Patient Re-evaluated prior to induction Oxygen Delivery Method: Simple face mask

## 2018-07-07 ENCOUNTER — Encounter (HOSPITAL_COMMUNITY): Payer: Self-pay | Admitting: Vascular Surgery

## 2018-07-07 DIAGNOSIS — D509 Iron deficiency anemia, unspecified: Secondary | ICD-10-CM | POA: Diagnosis not present

## 2018-07-07 DIAGNOSIS — Z992 Dependence on renal dialysis: Secondary | ICD-10-CM | POA: Diagnosis not present

## 2018-07-07 DIAGNOSIS — N2581 Secondary hyperparathyroidism of renal origin: Secondary | ICD-10-CM | POA: Diagnosis not present

## 2018-07-07 DIAGNOSIS — D631 Anemia in chronic kidney disease: Secondary | ICD-10-CM | POA: Diagnosis not present

## 2018-07-07 DIAGNOSIS — N186 End stage renal disease: Secondary | ICD-10-CM | POA: Diagnosis not present

## 2018-07-07 DIAGNOSIS — R112 Nausea with vomiting, unspecified: Secondary | ICD-10-CM | POA: Diagnosis not present

## 2018-07-08 ENCOUNTER — Other Ambulatory Visit (HOSPITAL_COMMUNITY): Payer: Self-pay | Admitting: Urology

## 2018-07-08 ENCOUNTER — Other Ambulatory Visit: Payer: Self-pay | Admitting: Urology

## 2018-07-08 DIAGNOSIS — D49511 Neoplasm of unspecified behavior of right kidney: Secondary | ICD-10-CM

## 2018-07-09 DIAGNOSIS — N186 End stage renal disease: Secondary | ICD-10-CM | POA: Diagnosis not present

## 2018-07-09 DIAGNOSIS — R112 Nausea with vomiting, unspecified: Secondary | ICD-10-CM | POA: Diagnosis not present

## 2018-07-09 DIAGNOSIS — Z992 Dependence on renal dialysis: Secondary | ICD-10-CM | POA: Diagnosis not present

## 2018-07-09 DIAGNOSIS — N2581 Secondary hyperparathyroidism of renal origin: Secondary | ICD-10-CM | POA: Diagnosis not present

## 2018-07-09 DIAGNOSIS — D509 Iron deficiency anemia, unspecified: Secondary | ICD-10-CM | POA: Diagnosis not present

## 2018-07-09 DIAGNOSIS — D631 Anemia in chronic kidney disease: Secondary | ICD-10-CM | POA: Diagnosis not present

## 2018-07-12 ENCOUNTER — Emergency Department (HOSPITAL_COMMUNITY): Payer: Medicare Other

## 2018-07-12 ENCOUNTER — Telehealth: Payer: Self-pay

## 2018-07-12 ENCOUNTER — Emergency Department (HOSPITAL_COMMUNITY)
Admission: EM | Admit: 2018-07-12 | Discharge: 2018-07-12 | Disposition: A | Discharge status: Home / Self Care | Payer: Medicare Other | Attending: Emergency Medicine | Admitting: Emergency Medicine

## 2018-07-12 ENCOUNTER — Encounter (HOSPITAL_COMMUNITY): Payer: Self-pay | Admitting: Emergency Medicine

## 2018-07-12 ENCOUNTER — Other Ambulatory Visit: Payer: Self-pay

## 2018-07-12 DIAGNOSIS — R112 Nausea with vomiting, unspecified: Secondary | ICD-10-CM

## 2018-07-12 DIAGNOSIS — I120 Hypertensive chronic kidney disease with stage 5 chronic kidney disease or end stage renal disease: Secondary | ICD-10-CM | POA: Insufficient documentation

## 2018-07-12 DIAGNOSIS — N2889 Other specified disorders of kidney and ureter: Secondary | ICD-10-CM | POA: Diagnosis not present

## 2018-07-12 DIAGNOSIS — Z992 Dependence on renal dialysis: Secondary | ICD-10-CM | POA: Diagnosis not present

## 2018-07-12 DIAGNOSIS — E119 Type 2 diabetes mellitus without complications: Secondary | ICD-10-CM | POA: Diagnosis not present

## 2018-07-12 DIAGNOSIS — N186 End stage renal disease: Secondary | ICD-10-CM

## 2018-07-12 DIAGNOSIS — Z7901 Long term (current) use of anticoagulants: Secondary | ICD-10-CM | POA: Insufficient documentation

## 2018-07-12 DIAGNOSIS — R109 Unspecified abdominal pain: Secondary | ICD-10-CM | POA: Insufficient documentation

## 2018-07-12 DIAGNOSIS — R0600 Dyspnea, unspecified: Secondary | ICD-10-CM | POA: Diagnosis not present

## 2018-07-12 DIAGNOSIS — K5900 Constipation, unspecified: Secondary | ICD-10-CM | POA: Diagnosis not present

## 2018-07-12 DIAGNOSIS — E278 Other specified disorders of adrenal gland: Secondary | ICD-10-CM | POA: Diagnosis not present

## 2018-07-12 DIAGNOSIS — R000 Tachycardia, unspecified: Secondary | ICD-10-CM | POA: Diagnosis not present

## 2018-07-12 DIAGNOSIS — R0689 Other abnormalities of breathing: Secondary | ICD-10-CM | POA: Diagnosis not present

## 2018-07-12 DIAGNOSIS — I4510 Unspecified right bundle-branch block: Secondary | ICD-10-CM | POA: Diagnosis not present

## 2018-07-12 DIAGNOSIS — I959 Hypotension, unspecified: Secondary | ICD-10-CM | POA: Diagnosis not present

## 2018-07-12 DIAGNOSIS — Z79899 Other long term (current) drug therapy: Secondary | ICD-10-CM | POA: Insufficient documentation

## 2018-07-12 DIAGNOSIS — N200 Calculus of kidney: Secondary | ICD-10-CM | POA: Diagnosis not present

## 2018-07-12 LAB — COMPREHENSIVE METABOLIC PANEL
ALT: 9 U/L (ref 0–44)
AST: 36 U/L (ref 15–41)
Albumin: 3.8 g/dL (ref 3.5–5.0)
Alkaline Phosphatase: 65 U/L (ref 38–126)
Anion gap: 16 — ABNORMAL HIGH (ref 5–15)
BUN: 65 mg/dL — ABNORMAL HIGH (ref 6–20)
CO2: 27 mmol/L (ref 22–32)
Calcium: 11 mg/dL — ABNORMAL HIGH (ref 8.9–10.3)
Chloride: 96 mmol/L — ABNORMAL LOW (ref 98–111)
Creatinine, Ser: 17.69 mg/dL — ABNORMAL HIGH (ref 0.61–1.24)
GFR calc Af Amer: 3 mL/min — ABNORMAL LOW (ref >60)
GFR calc non Af Amer: 3 mL/min — ABNORMAL LOW (ref >60)
Glucose, Bld: 103 mg/dL — ABNORMAL HIGH (ref 70–99)
Potassium: 3.8 mmol/L (ref 3.5–5.1)
Sodium: 139 mmol/L (ref 135–145)
Total Bilirubin: 0.7 mg/dL (ref 0.3–1.2)
Total Protein: 8.1 g/dL (ref 6.5–8.1)

## 2018-07-12 LAB — TROPONIN I
Troponin I: <0.03 ng/mL (ref <0.03)
Troponin I: <0.03 ng/mL (ref <0.03)

## 2018-07-12 LAB — CBC WITH DIFFERENTIAL/PLATELET
Abs Immature Granulocytes: 0.04 10*3/uL (ref 0.00–0.07)
Basophils Absolute: 0 10*3/uL (ref 0.0–0.1)
Basophils Relative: 0 %
Eosinophils Absolute: 0.1 10*3/uL (ref 0.0–0.5)
Eosinophils Relative: 1 %
HCT: 45.9 % (ref 39.0–52.0)
Hemoglobin: 14.7 g/dL (ref 13.0–17.0)
Immature Granulocytes: 0 %
Lymphocytes Relative: 7 %
Lymphs Abs: 0.6 10*3/uL — ABNORMAL LOW (ref 0.7–4.0)
MCH: 29.9 pg (ref 26.0–34.0)
MCHC: 32 g/dL (ref 30.0–36.0)
MCV: 93.5 fL (ref 80.0–100.0)
Monocytes Absolute: 0.9 10*3/uL (ref 0.1–1.0)
Monocytes Relative: 10 %
Neutro Abs: 7.5 10*3/uL (ref 1.7–7.7)
Neutrophils Relative %: 82 %
Platelets: 124 10*3/uL — ABNORMAL LOW (ref 150–400)
RBC: 4.91 MIL/uL (ref 4.22–5.81)
RDW: 15.7 % — ABNORMAL HIGH (ref 11.5–15.5)
WBC: 9.1 10*3/uL (ref 4.0–10.5)
nRBC: 0 % (ref 0.0–0.2)

## 2018-07-12 LAB — BRAIN NATRIURETIC PEPTIDE: B Natriuretic Peptide: 28 pg/mL (ref 0.0–100.0)

## 2018-07-12 LAB — LIPASE, BLOOD: Lipase: 44 U/L (ref 11–51)

## 2018-07-12 MED ORDER — ONDANSETRON HCL 4 MG/2ML IJ SOLN
INTRAMUSCULAR | Status: AC
  Administered 2018-07-12: 4 mg via INTRAVENOUS
  Filled 2018-07-12: qty 2

## 2018-07-12 MED ORDER — APIXABAN 2.5 MG PO TABS: ORAL_TABLET | ORAL | 0 refills | Status: DC

## 2018-07-12 MED ORDER — ONDANSETRON HCL 4 MG/2ML IJ SOLN
4.0000 mg | Freq: Once | INTRAMUSCULAR | Status: AC
  Administered 2018-07-12: 4 mg via INTRAVENOUS

## 2018-07-12 MED ORDER — SODIUM CHLORIDE 0.9 % IV BOLUS
500.0000 mL | Freq: Once | INTRAVENOUS | Status: AC
  Administered 2018-07-12: 500 mL via INTRAVENOUS

## 2018-07-12 MED ORDER — ONDANSETRON 4 MG PO TBDP: 4.0000 mg | ORAL_TABLET | Freq: Three times a day (TID) | ORAL | 0 refills | Status: DC | PRN

## 2018-07-12 NOTE — Discharge Instructions (Signed)
Take the prescription as directed. Eat a bland diet and advance to your regular diet slowly as you can tolerate it. Call your regular dialysis center today to schedule a follow up appointment to have dialysis either today or go there tomorrow.  Call your regular medical doctor today to schedule a follow up appointment within the week. Return to the Emergency Department immediately sooner if worsening.

## 2018-07-12 NOTE — ED Triage Notes (Addendum)
Pt C/O nausea and vomiting that began upon waking this AM. Pt is due for dialysis this morning. Pt had new dialysis fistula placed Tuesday.

## 2018-07-12 NOTE — ED Notes (Signed)
Pt tolerated PO liquids.

## 2018-07-12 NOTE — Telephone Encounter (Signed)
Refill sent into pharmacy. Thanks!  

## 2018-07-12 NOTE — ED Provider Notes (Signed)
[facility] EMERGENCY DEPARTMENT Provider Note   CSN: [id number] Arrival date & time: 07/12/18  0533    History   Chief Complaint Chief Complaint  Patient presents with  . Emesis    HPI Jack Irwin is a 53 y.o. male.     Patient with history of ESRD on dialysis Monday Wednesday Friday, DVT on Eliquis, hypertension, gout, diabetes presenting from home with nausea vomiting abdominal pain onset this morning.  States he was due for dialysis this morning and denies any missed sessions.  He felt well when he went to bed last night.  States he vomited about 4-5 times it was nonbilious and nonbloody.  Has diffuse abdominal pain that is crampy.  Denies any diarrhea, fever.  No chest pain or shortness of breath.  Denies any missed dialysis sessions.  No previous history of abdominal pain.  No focal weakness, numbness or tingling.  He had surgery on his left arm graft last week by Dr. Oneida Alar  The history is provided by the patient.  Emesis  Associated symptoms: abdominal pain   Associated symptoms: no arthralgias, no cough, no diarrhea, no headaches and no myalgias     Past Medical History:  Diagnosis Date  . Anemia   . Arthritis   . BPH (benign prostatic hyperplasia)   . Chronic back pain   . Difficulty walking   . DVT (deep venous thrombosis) ([place])    Right popliteal DVT December 2017  . Dyspnea    with exertion  . End-stage renal disease on hemodialysis ([place])    Dr. Lowanda Foster  - dialysis M/WF  . Erectile dysfunction   . Essential hypertension    history of, now- hypotenison.  . Gout   . History of blood transfusion   . Neuropathy, diabetic ([place])   . Type 2 diabetes mellitus ([place])   . Venous (peripheral) insufficiency     Patient Active Problem List   Diagnosis Date Noted  . Onychomycosis 06/17/2016  . Constipation 03/05/2016  . Anemia in chronic kidney disease (CKD) 03/05/2016  . Bleeding from wound 03/03/2016  . Chronic anticoagulation 03/03/2016  . Scrotal  abscess 02/25/2016  . Type 2 diabetes mellitus ([place]) 02/25/2016  . Acute venous embolism and thrombosis of deep vessels of proximal lower extremity ([place]) [I82.4Y9] 02/11/2016  . Encounter for therapeutic drug monitoring 02/11/2016  . Nausea & vomiting 11/17/2014  . Dependence on renal dialysis ([place]) 04/02/2014  . Dilated cardiomyopathy ([place]) 04/02/2014  . Osteopathy in diseases classified elsewhere, unspecified site 04/02/2014  . Thrombocytopenia ([place]) 02/21/2014  . Morbid obesity ([place]) 03/30/2013  . RBBB 03/29/2013  . Hyperkalemia 03/28/2013  . Leukocytosis 03/28/2013  . Nausea vomiting and diarrhea 03/28/2013  . BBB (bundle branch block) 03/28/2013  . Hypertension   . ESRD on dialysis ([place])   . Lack of coordination 04/15/2012  . Muscle weakness (generalized) 04/15/2012  . Arthritis, gouty 04/15/2012  . Difficulty walking 08/18/2011  . Weakness of both legs 08/18/2011  . Gout 01/22/2011  . Heart failure ([place]) 01/22/2011  . Paroxysmal tachycardia ([place]) 01/22/2011    Past Surgical History:  Procedure Laterality Date  . AV FISTULA PLACEMENT Left 03/09/2014   Procedure: INSERTION OF ARTERIOVENOUS (AV) GORE-TEX GRAFT ARM;  Surgeon: Angelia Mould, MD;  Location: [facility] OR;  Service: Vascular;  Laterality: Left;  . AV FISTULA PLACEMENT Right 05/06/2016   Procedure: INSERTION OF ARTERIOVENOUS (AV) GORE-TEX GRAFT  Right ARM;  Surgeon: Elam Dutch, MD;  Location: [facility];  Service: Vascular;  Laterality: Right;  . AV FISTULA PLACEMENT Right 12/15/2017   Procedure: ARTERIOVENOUS (AV) BRACHIOCEPHALIC FISTULA CREATION;  Surgeon: Elam Dutch, MD;  Location: [facility] OR;  Service: Vascular;  Laterality: Right;  . AV FISTULA PLACEMENT Left 07/06/2018   Procedure: INSERTION OF ARTERIOVENOUS LOOP GRAFT LEFT UPPER ARM;  Surgeon: Elam Dutch, MD;  Location: [facility];  Service: Vascular;  Laterality: Left;  . CYST EXCISION Right    cyst removed on thumb 2001  . CYSTOSCOPY N/A 09/23/2017    Procedure: CYSTOSCOPY;  Surgeon: Cleon Gustin, MD;  Location: [facility];  Service: Urology;  Laterality: N/A;  . INSERTION OF DIALYSIS CATHETER    . INSERTION OF DIALYSIS CATHETER Left 03/09/2014   Procedure: INSERTION OF DIALYSIS CATHETER;  Surgeon: Angelia Mould, MD;  Location: [facility];  Service: Vascular;  Laterality: Left;  . IR THROMBECTOMY AV FISTULA W/THROMBOLYSIS/PTA INC/SHUNT/IMG RIGHT Right 11/01/2016  . IR US GUIDE VASC ACCESS RIGHT  11/01/2016  . IRRIGATION AND DEBRIDEMENT ABSCESS N/A 02/25/2016   Procedure: IRRIGATION AND DEBRIDEMENT SCROTAL ABSCESS;  Surgeon: Nickie Retort, MD;  Location: [facility];  Service: Urology;  Laterality: N/A;  . UPPER EXTREMITY VENOGRAPHY Left 04/08/2018   Procedure: UPPER EXTREMITY VENOGRAPHY;  Surgeon: Marty Heck, MD;  Location: [facility] LAB;  Service: Cardiovascular;  Laterality: Left;        Home Medications    Prior to Admission medications   Medication Sig Start Date End Date Taking? Authorizing Provider  acetaminophen (TYLENOL) 500 MG tablet Take 1,000 mg by mouth every 6 (six) hours as needed for mild pain.     [provider]  ammonium lactate (AMLACTIN) 12 % lotion Apply 1 application topically as needed for dry skin. 03/10/18   Lanae Boast, FNP  Blood Pressure Monitor DEVI 1 Device by Does not apply route daily. Patient not taking: Reported on 06/29/2018 06/22/18   Lanae Boast, FNP  clotrimazole (LOTRIMIN) 1 % cream Apply to both feet and between toes twice daily Patient taking differently: Apply 1 application topically 2 (two) times daily as needed. Apply to both feet and between toes twice daily 06/08/18   Marzetta Board, DPM  clotrimazole-betamethasone (LOTRISONE) cream Apply 1 application topically 2 (two) times daily as needed (boils).    [provider]  colchicine 0.6 MG tablet Take 0.5 tablets (0.3 mg total) by mouth daily as needed (for gout). 07/28/17   Dorena Dew, FNP  ELIQUIS  2.5 MG TABS tablet TAKE ONE TABLET BY MOUTH 2 TIMES A DAY. Patient taking differently: Take 2.5 mg by mouth 2 (two) times daily.  05/24/18   Lanae Boast, FNP  finasteride (PROSCAR) 5 MG tablet Take 5 mg by mouth daily.  09/30/17   [provider]  gabapentin (NEURONTIN) 100 MG capsule Take 1 capsule (100 mg total) by mouth daily. Patient taking differently: Take 100 mg by mouth every evening.  06/21/18   Lanae Boast, FNP  midodrine (PROAMATINE) 10 MG tablet Take 10 mg by mouth See admin instructions. 1 tablet by mouth 45 minutes before dialysis 06/07/18   [provider]  oxyCODONE (ROXICODONE) 5 MG immediate release tablet Take 1 tablet (5 mg total) by mouth every 6 (six) hours as needed. 07/06/18   Rhyne, Samantha J, PA-C  Pseudoephedrine-APAP-DM (DAYQUIL PO) Take 1 tablet by mouth every 6 (six) hours as needed (cold symptoms).    [provider]  sevelamer carbonate (RENVELA) 800 MG tablet Take 2-4 tablets (1,600-3,200 mg total) by  mouth See admin instructions. Take 3200 mg with each meal and take 1600 mg with each snack 06/17/18   Lanae Boast, FNP  sildenafil (REVATIO) 20 MG tablet DISSOLVE 1/2 TROCHE UNDER THE TONGUE 60 MINUTES PRIOR TO SEXUAL ENCOUNTER. Patient taking differently: Take 20 mg by mouth See admin instructions. DISSOLVE 1/2 TROCHE UNDER THE TONGUE 60 MINUTES PRIOR TO SEXUAL ENCOUNTER. 01/11/18   Lanae Boast, FNP  ULORIC 40 MG tablet Take 1 tablet (40 mg total) by mouth daily. 07/20/17   Dorena Dew, FNP    Family History Family History  Problem Relation Age of Onset  . Diabetes Mother   . Hypertension Mother   . Heart failure Mother   . Hyperlipidemia Mother   . Heart attack Mother   . Cancer Father   . Diabetes Father   . Hypertension Father   . Hyperlipidemia Father     Social History Social History   Tobacco Use  . Smoking status: Never Smoker  . Smokeless tobacco: Never Used  Substance Use Topics  . Alcohol use: No     Alcohol/week: 0.0 standard drinks  . Drug use: No     Allergies   Doxycycline   Review of Systems Review of Systems  Constitutional: Positive for activity change and appetite change.  HENT: Negative for congestion.   Eyes: Negative for visual disturbance.  Respiratory: Negative for cough, chest tightness and shortness of breath.   Gastrointestinal: Positive for abdominal pain, nausea and vomiting. Negative for diarrhea.  Genitourinary: Negative for dysuria and hematuria.  Musculoskeletal: Negative for arthralgias and myalgias.  Skin: Negative for rash.  Neurological: Negative for dizziness and headaches.    all other systems are negative except as noted in the HPI and PMH.    Physical Exam Updated Vital Signs BP 94/73 (BP Location: Right Arm)   Pulse (!) 101   Temp 97.9 F (36.6 C) (Oral)   Resp 18   Wt 118.8 kg   SpO2 99%   BMI 36.54 kg/m   Physical Exam Vitals signs and nursing note reviewed.  Constitutional:      General: He is not in acute distress.    Appearance: He is well-developed. He is obese. He is ill-appearing.     Comments: Chronically ill-appearing  HENT:     Head: Normocephalic and atraumatic.     Mouth/Throat:     Mouth: Mucous membranes are moist.     Pharynx: No oropharyngeal exudate.  Eyes:     Conjunctiva/sclera: Conjunctivae normal.     Pupils: Pupils are equal, round, and reactive to light.  Neck:     Musculoskeletal: Normal range of motion and neck supple.     Comments: No meningismus. Cardiovascular:     Rate and Rhythm: Normal rate and regular rhythm.     Heart sounds: Normal heart sounds. No murmur.     Comments: Dialysis catheter right chest Pulmonary:     Effort: Pulmonary effort is normal. No respiratory distress.     Breath sounds: Normal breath sounds.  Abdominal:     Palpations: Abdomen is soft.     Tenderness: There is abdominal tenderness. There is no guarding or rebound.     Comments: Soft, mild diffuse tenderness  without guarding or rebound  Musculoskeletal: Normal range of motion.        General: No tenderness.     Comments: Fistula left upper extremity with positive thrill  Skin:    General: Skin is warm.     Capillary Refill:  Capillary refill takes less than 2 seconds.  Neurological:     General: No focal deficit present.     Mental Status: He is alert and oriented to person, place, and time. Mental status is at baseline.     Cranial Nerves: No cranial nerve deficit.     Motor: No abnormal muscle tone.     Coordination: Coordination normal.     Comments: No ataxia on finger to nose bilaterally. No pronator drift. 5/5 strength throughout. CN 2-12 intact.Equal grip strength. Sensation intact.   Psychiatric:        Behavior: Behavior normal.      ED Treatments / Results  Labs (all labs ordered are listed, but only abnormal results are displayed) Labs Reviewed  CBC WITH DIFFERENTIAL/PLATELET - Abnormal; Notable for the following components:      Result Value   RDW 15.7 (*)    Platelets 124 (*)    Lymphs Abs 0.6 (*)    All other components within normal limits  COMPREHENSIVE METABOLIC PANEL - Abnormal; Notable for the following components:   Chloride 96 (*)    Glucose, Bld 103 (*)    BUN 65 (*)    Creatinine, Ser 17.69 (*)    Calcium 11.0 (*)    GFR calc non Af Amer 3 (*)    GFR calc Af Amer 3 (*)    Anion gap 16 (*)    All other components within normal limits  TROPONIN I  BRAIN NATRIURETIC PEPTIDE  LIPASE, BLOOD  URINALYSIS, ROUTINE W REFLEX MICROSCOPIC    EKG EKG Interpretation  Date/Time:  Monday July 12 2018 05:37:35 EDT Ventricular Rate:  102 PR Interval:    QRS Duration: 140 QT Interval:  372 QTC Calculation: 485 R Axis:   -20 Text Interpretation:  Sinus tachycardia Right bundle branch block No significant change was found Confirmed by Ezequiel Essex [phone]) on 07/12/2018 6:05:19 AM   Radiology Dg Chest Portable 1 View  Result Date: 07/12/2018 CLINICAL DATA:   Nausea and vomiting EXAM: PORTABLE CHEST 1 VIEW COMPARISON:  11/01/15 FINDINGS: Cardiac shadow is mildly prominent but stable. Right-sided jugular dialysis catheter is noted in satisfactory position. The lungs are clear bilaterally. No bony abnormality is noted. IMPRESSION: No acute abnormality noted. Electronically Signed   By: Inez Catalina M.D.   On: 07/12/2018 07:11   Dg Abd Portable 2 Views  Result Date: 07/12/2018 CLINICAL DATA:  Shortness of breath EXAM: PORTABLE ABDOMEN - 2 VIEW COMPARISON:  None. FINDINGS: Scattered large and small bowel gas is noted. No free air is noted. No obstructive changes are seen. Mild constipation is noted. No bony abnormality is noted. IMPRESSION: Mild constipation. Electronically Signed   By: Inez Catalina M.D.   On: 07/12/2018 07:11    Procedures Procedures (including critical care time)  Medications Ordered in ED Medications - No data to display   Initial Impression / Assessment and Plan / ED Course  I have reviewed the triage vital signs and the nursing notes.  Pertinent labs & imaging results that were available during my care of the patient were reviewed by me and considered in my medical decision making (see chart for details).        Patient with sudden onset nausea and vomiting this morning along with crampy lower abdominal pain.  EKG is a right bundle branch block which is unchanged.  Blood pressure is soft in the 86P systolic.  Patient states he takes Midrin on his dialysis days which she did take  this morning.  X-ray is negative but does show some constipation.  Labs are reassuring with normal potassium.  Patient denies any shortness of breath or chest pain.  He is soft blood pressure in the 90s which he states is typical for him on dialysis days.  Denies fever.  CT scan will be obtained to further assess his abdominal pain and vomiting. Lipase and LFTs are normal.  Care to be transferred at shift change pending CT scan.  Patient may be  able to go to dialysis today if his CT scan is reassuring and he is tolerating p.o.  Final Clinical Impressions(s) / ED Diagnoses   Final diagnoses:  None    ED Discharge Orders    None       Lauralie Blacksher, Annie Main, MD 07/12/18 [phone]

## 2018-07-12 NOTE — ED Provider Notes (Signed)
Pt received at sign out with CT A/P pending. Please see previous EDP note for full HPI/H&P/MDM. CT reassuring. Labs and VS per baseline. Pt has tol PO well while in the ED without N/V.  No stooling while in the ED.  Abd benign, resps easy, VSS. Feels better and wants to go home now.  1135: T/C returned from Renal Dr. Marval Regal, case discussed, including:  HPI, pertinent PM/SHx, VS/PE, dx testing, ED course and treatment:  pt does not need admission at this time, pt can call his HD center today to obtain appt for HD today or tomorrow. Dx and testing, as well as d/w Renal MD, d/w pt.  Questions answered.  Verb understanding, agreeable to d/c home with outpt f/u.   Patient Vitals for the past 24 hrs:  BP Temp Temp src Pulse Resp SpO2 Weight  07/12/18 1217 - - - - - 99 % -  07/12/18 1211 107/72 97.8 F (36.6 C) Oral 90 12 - -  07/12/18 1130 106/72 - - - 17 - -  07/12/18 1100 107/72 - - - (!) 0 - -  07/12/18 0733 110/79 - - 89 16 100 % -  07/12/18 0544 94/73 97.9 F (36.6 C) Oral (!) 101 18 99 % -  07/12/18 0536 - - - - - - 118.8 kg     Ct Abdomen Pelvis Wo Contrast Result Date: 07/12/2018 CLINICAL DATA:  Nausea vomiting for several hours EXAM: CT ABDOMEN AND PELVIS WITHOUT CONTRAST TECHNIQUE: Multidetector CT imaging of the abdomen and pelvis was performed following the standard protocol without IV contrast. COMPARISON:  Plain film from earlier in the same day, MRI from 06/10/2016 as well as ultrasounds from April and November of 2019. FINDINGS: Lower chest: Lung bases demonstrate some minimal atelectatic change. Hepatobiliary: No focal liver abnormality is seen. No gallstones, gallbladder wall thickening, or biliary dilatation. Pancreas: Unremarkable. No pancreatic ductal dilatation or surrounding inflammatory changes. Spleen: Normal in size without focal abnormality. Adrenals/Urinary Tract: Left adrenal gland is within normal limits. The right adrenal gland demonstrates a focal 1.6 cm adenoma which  is stable dating back to prior MRI from 2018. The left kidney demonstrates multiple renal calculi without obstructive change. Small calculi are noted on the right as well. A hypodense lesion is noted in the upper pole of the right kidney which measures approximately 3.8 cm in greatest dimension although incompletely evaluated due to the lack of IV contrast. A small hypodense lesion and more isodense lesion are seen within the left kidney stable from the prior exam. Ureters are within normal limits. The bladder is decompressed. Stomach/Bowel: The appendix is within normal limits. Diverticular change of the colon is noted. Mild retained fecal material is seen similar to that noted on prior plain film examination. No obstructive changes are noted. Vascular/Lymphatic: Aortic atherosclerosis. No enlarged abdominal or pelvic lymph nodes. Reproductive: Prostate is unremarkable. Other: No abdominal wall hernia or abnormality. No abdominopelvic ascites. Musculoskeletal: Degenerative changes of lumbar spine are noted. IMPRESSION: Stable right adrenal lesion and bilateral renal lesions similar to that seen on prior MRI examination from 2018. Although their stability suggests a benign etiology, nonemergent MRI may be helpful for further evaluation. Diverticulosis without diverticulitis. Nonobstructing bilateral renal calculi. Electronically Signed   By: Inez Catalina M.D.   On: 07/12/2018 10:40        Francine Graven, DO 07/12/18 1227

## 2018-07-13 DIAGNOSIS — Z992 Dependence on renal dialysis: Secondary | ICD-10-CM | POA: Diagnosis not present

## 2018-07-13 DIAGNOSIS — N2581 Secondary hyperparathyroidism of renal origin: Secondary | ICD-10-CM | POA: Diagnosis not present

## 2018-07-13 DIAGNOSIS — N186 End stage renal disease: Secondary | ICD-10-CM | POA: Diagnosis not present

## 2018-07-13 DIAGNOSIS — D509 Iron deficiency anemia, unspecified: Secondary | ICD-10-CM | POA: Diagnosis not present

## 2018-07-13 DIAGNOSIS — D631 Anemia in chronic kidney disease: Secondary | ICD-10-CM | POA: Diagnosis not present

## 2018-07-13 DIAGNOSIS — Z1159 Encounter for screening for other viral diseases: Secondary | ICD-10-CM | POA: Diagnosis not present

## 2018-07-13 DIAGNOSIS — R112 Nausea with vomiting, unspecified: Secondary | ICD-10-CM | POA: Diagnosis not present

## 2018-07-14 DIAGNOSIS — Z992 Dependence on renal dialysis: Secondary | ICD-10-CM | POA: Diagnosis not present

## 2018-07-14 DIAGNOSIS — D631 Anemia in chronic kidney disease: Secondary | ICD-10-CM | POA: Diagnosis not present

## 2018-07-14 DIAGNOSIS — R112 Nausea with vomiting, unspecified: Secondary | ICD-10-CM | POA: Diagnosis not present

## 2018-07-14 DIAGNOSIS — D509 Iron deficiency anemia, unspecified: Secondary | ICD-10-CM | POA: Diagnosis not present

## 2018-07-14 DIAGNOSIS — N2581 Secondary hyperparathyroidism of renal origin: Secondary | ICD-10-CM | POA: Diagnosis not present

## 2018-07-14 DIAGNOSIS — N186 End stage renal disease: Secondary | ICD-10-CM | POA: Diagnosis not present

## 2018-07-16 DIAGNOSIS — N186 End stage renal disease: Secondary | ICD-10-CM | POA: Diagnosis not present

## 2018-07-16 DIAGNOSIS — N2581 Secondary hyperparathyroidism of renal origin: Secondary | ICD-10-CM | POA: Diagnosis not present

## 2018-07-16 DIAGNOSIS — D631 Anemia in chronic kidney disease: Secondary | ICD-10-CM | POA: Diagnosis not present

## 2018-07-16 DIAGNOSIS — D509 Iron deficiency anemia, unspecified: Secondary | ICD-10-CM | POA: Diagnosis not present

## 2018-07-16 DIAGNOSIS — Z992 Dependence on renal dialysis: Secondary | ICD-10-CM | POA: Diagnosis not present

## 2018-07-16 DIAGNOSIS — R112 Nausea with vomiting, unspecified: Secondary | ICD-10-CM | POA: Diagnosis not present

## 2018-07-19 DIAGNOSIS — N186 End stage renal disease: Secondary | ICD-10-CM | POA: Diagnosis not present

## 2018-07-19 DIAGNOSIS — D509 Iron deficiency anemia, unspecified: Secondary | ICD-10-CM | POA: Diagnosis not present

## 2018-07-19 DIAGNOSIS — N2581 Secondary hyperparathyroidism of renal origin: Secondary | ICD-10-CM | POA: Diagnosis not present

## 2018-07-19 DIAGNOSIS — Z992 Dependence on renal dialysis: Secondary | ICD-10-CM | POA: Diagnosis not present

## 2018-07-19 DIAGNOSIS — R112 Nausea with vomiting, unspecified: Secondary | ICD-10-CM | POA: Diagnosis not present

## 2018-07-19 DIAGNOSIS — D631 Anemia in chronic kidney disease: Secondary | ICD-10-CM | POA: Diagnosis not present

## 2018-07-20 ENCOUNTER — Encounter (HOSPITAL_COMMUNITY): Payer: Self-pay

## 2018-07-20 ENCOUNTER — Ambulatory Visit (HOSPITAL_COMMUNITY): Payer: Medicare Other

## 2018-07-20 ENCOUNTER — Other Ambulatory Visit: Payer: Self-pay

## 2018-07-20 DIAGNOSIS — N529 Male erectile dysfunction, unspecified: Secondary | ICD-10-CM

## 2018-07-20 MED ORDER — SILDENAFIL CITRATE 20 MG PO TABS: ORAL_TABLET | ORAL | 98 refills | Status: AC

## 2018-07-21 DIAGNOSIS — N2581 Secondary hyperparathyroidism of renal origin: Secondary | ICD-10-CM | POA: Diagnosis not present

## 2018-07-21 DIAGNOSIS — R112 Nausea with vomiting, unspecified: Secondary | ICD-10-CM | POA: Diagnosis not present

## 2018-07-21 DIAGNOSIS — D631 Anemia in chronic kidney disease: Secondary | ICD-10-CM | POA: Diagnosis not present

## 2018-07-21 DIAGNOSIS — Z992 Dependence on renal dialysis: Secondary | ICD-10-CM | POA: Diagnosis not present

## 2018-07-21 DIAGNOSIS — D509 Iron deficiency anemia, unspecified: Secondary | ICD-10-CM | POA: Diagnosis not present

## 2018-07-21 DIAGNOSIS — N186 End stage renal disease: Secondary | ICD-10-CM | POA: Diagnosis not present

## 2018-07-22 ENCOUNTER — Ambulatory Visit: Payer: Self-pay | Admitting: Family Medicine

## 2018-07-22 ENCOUNTER — Telehealth: Payer: Self-pay

## 2018-07-22 MED ORDER — ONDANSETRON 4 MG PO TBDP: 4.0000 mg | ORAL_TABLET | Freq: Three times a day (TID) | ORAL | 0 refills | Status: DC | PRN

## 2018-07-22 NOTE — Telephone Encounter (Signed)
Sent to pharmacy 

## 2018-07-23 DIAGNOSIS — D509 Iron deficiency anemia, unspecified: Secondary | ICD-10-CM | POA: Diagnosis not present

## 2018-07-23 DIAGNOSIS — Z992 Dependence on renal dialysis: Secondary | ICD-10-CM | POA: Diagnosis not present

## 2018-07-23 DIAGNOSIS — N186 End stage renal disease: Secondary | ICD-10-CM | POA: Diagnosis not present

## 2018-07-23 DIAGNOSIS — D631 Anemia in chronic kidney disease: Secondary | ICD-10-CM | POA: Diagnosis not present

## 2018-07-23 DIAGNOSIS — R112 Nausea with vomiting, unspecified: Secondary | ICD-10-CM | POA: Diagnosis not present

## 2018-07-23 DIAGNOSIS — N2581 Secondary hyperparathyroidism of renal origin: Secondary | ICD-10-CM | POA: Diagnosis not present

## 2018-07-26 ENCOUNTER — Telehealth: Payer: Self-pay | Admitting: Radiology

## 2018-07-26 ENCOUNTER — Other Ambulatory Visit (HOSPITAL_COMMUNITY): Payer: Self-pay | Admitting: Urology

## 2018-07-26 ENCOUNTER — Other Ambulatory Visit: Payer: Self-pay | Admitting: Urology

## 2018-07-26 DIAGNOSIS — N186 End stage renal disease: Secondary | ICD-10-CM | POA: Diagnosis not present

## 2018-07-26 DIAGNOSIS — R112 Nausea with vomiting, unspecified: Secondary | ICD-10-CM | POA: Diagnosis not present

## 2018-07-26 DIAGNOSIS — N2581 Secondary hyperparathyroidism of renal origin: Secondary | ICD-10-CM | POA: Diagnosis not present

## 2018-07-26 DIAGNOSIS — R102 Pelvic and perineal pain: Secondary | ICD-10-CM

## 2018-07-26 DIAGNOSIS — D509 Iron deficiency anemia, unspecified: Secondary | ICD-10-CM | POA: Diagnosis not present

## 2018-07-26 DIAGNOSIS — Z992 Dependence on renal dialysis: Secondary | ICD-10-CM | POA: Diagnosis not present

## 2018-07-26 DIAGNOSIS — D631 Anemia in chronic kidney disease: Secondary | ICD-10-CM | POA: Diagnosis not present

## 2018-07-28 DIAGNOSIS — D509 Iron deficiency anemia, unspecified: Secondary | ICD-10-CM | POA: Diagnosis not present

## 2018-07-28 DIAGNOSIS — N186 End stage renal disease: Secondary | ICD-10-CM | POA: Diagnosis not present

## 2018-07-28 DIAGNOSIS — D631 Anemia in chronic kidney disease: Secondary | ICD-10-CM | POA: Diagnosis not present

## 2018-07-28 DIAGNOSIS — R112 Nausea with vomiting, unspecified: Secondary | ICD-10-CM | POA: Diagnosis not present

## 2018-07-28 DIAGNOSIS — Z992 Dependence on renal dialysis: Secondary | ICD-10-CM | POA: Diagnosis not present

## 2018-07-28 DIAGNOSIS — N2581 Secondary hyperparathyroidism of renal origin: Secondary | ICD-10-CM | POA: Diagnosis not present

## 2018-07-29 ENCOUNTER — Telehealth (HOSPITAL_COMMUNITY): Payer: Self-pay | Admitting: Rehabilitation

## 2018-07-29 NOTE — Telephone Encounter (Signed)

## 2018-07-30 ENCOUNTER — Encounter: Payer: Self-pay | Admitting: Family Medicine

## 2018-07-30 ENCOUNTER — Ambulatory Visit (INDEPENDENT_AMBULATORY_CARE_PROVIDER_SITE_OTHER): Payer: Medicare Other | Admitting: Family Medicine

## 2018-07-30 ENCOUNTER — Ambulatory Visit (INDEPENDENT_AMBULATORY_CARE_PROVIDER_SITE_OTHER): Payer: Self-pay | Admitting: Physician Assistant

## 2018-07-30 ENCOUNTER — Other Ambulatory Visit: Payer: Self-pay

## 2018-07-30 VITALS — BP 97/70 | HR 82 | Temp 97.8°F | Resp 18 | Ht 71.0 in | Wt 257.0 lb

## 2018-07-30 DIAGNOSIS — N2581 Secondary hyperparathyroidism of renal origin: Secondary | ICD-10-CM | POA: Diagnosis not present

## 2018-07-30 DIAGNOSIS — Z992 Dependence on renal dialysis: Secondary | ICD-10-CM

## 2018-07-30 DIAGNOSIS — N521 Erectile dysfunction due to diseases classified elsewhere: Secondary | ICD-10-CM | POA: Diagnosis not present

## 2018-07-30 DIAGNOSIS — D631 Anemia in chronic kidney disease: Secondary | ICD-10-CM | POA: Diagnosis not present

## 2018-07-30 DIAGNOSIS — D509 Iron deficiency anemia, unspecified: Secondary | ICD-10-CM | POA: Diagnosis not present

## 2018-07-30 DIAGNOSIS — N186 End stage renal disease: Secondary | ICD-10-CM | POA: Diagnosis not present

## 2018-07-30 DIAGNOSIS — R112 Nausea with vomiting, unspecified: Secondary | ICD-10-CM | POA: Diagnosis not present

## 2018-07-30 NOTE — Progress Notes (Signed)
[facility]   Progress Note: Sick Visit Provider: Lanae Boast, FNP  SUBJECTIVE:   Jack Irwin is a 53 y.o. male who  has a past medical history of Anemia, Arthritis, BPH (benign prostatic hyperplasia), Chronic back pain, Difficulty walking, DVT (deep venous thrombosis) ([place]), Dyspnea, End-stage renal disease on hemodialysis ([place]), Erectile dysfunction, Essential hypertension, Gout, History of blood transfusion, Neuropathy, diabetic ([place]), Type 2 diabetes mellitus ([place]), and Venous (peripheral) insufficiency.. Patient presents today for Follow-up (wants to know if he should continue blood thinners ) and Erectile Dysfunction (previous ED ) Patient states that he is concerned that his blood thinner is causing his calcium to increase. He would like to know if he can stop it. He is a dialysis patient and recently had left upper arm loop graft. Has a follow.he reports that his erectile dysfunction medication is not working. He would like to have further evaluation of ED.  Review of Systems  Constitutional: Negative.   HENT: Negative.   Eyes: Negative.   Respiratory: Negative.   Cardiovascular: Negative.   Gastrointestinal: Negative.   Genitourinary: Negative.   Musculoskeletal: Positive for back pain and joint pain.  Skin: Negative.   Neurological: Negative.   Psychiatric/Behavioral: Negative.      OBJECTIVE: BP 102/62 (BP Location: Right Arm, Patient Position: Sitting, Cuff Size: Large)   Pulse 94   Temp 98.8 F (37.1 C) (Oral)   Resp 16   Ht 5\' 11"  (1.803 m)   Wt 259 lb (117.5 kg)   SpO2 99%   BMI 36.12 kg/m   Wt Readings from Last 3 Encounters:  07/30/18 259 lb (117.5 kg)  07/12/18 262 lb (118.8 kg)  07/06/18 262 lb (118.8 kg)     Physical Exam Vitals signs and nursing note reviewed.  Constitutional:      General: He is not in acute distress.    Appearance: Normal appearance.  HENT:     Head: Normocephalic and  atraumatic.  Eyes:     Extraocular Movements: Extraocular movements intact.     Conjunctiva/sclera: Conjunctivae normal.     Pupils: Pupils are equal, round, and reactive to light.  Cardiovascular:     Rate and Rhythm: Normal rate and regular rhythm.     Heart sounds: No murmur.  Pulmonary:     Effort: Pulmonary effort is normal.     Breath sounds: Normal breath sounds.  Musculoskeletal: Normal range of motion.  Skin:    General: Skin is warm and dry.  Neurological:     Mental Status: He is alert and oriented to person, place, and time.  Psychiatric:        Mood and Affect: Mood normal.        Behavior: Behavior normal.        Thought Content: Thought content normal.        Judgment: Judgment normal.     ASSESSMENT/PLAN:  1. Hypercalcemia Continue with current medications at this time. Will order labs and adjust accordingly.  - PTH, Intact and Calcium  2. Erectile disorder due to medical condition in male Patient educated on the mechanism of action with ED. Patient with multiple chronic conditions that can cause this. He is interested in discussing options other than medications and is inquiring about a penile pump.  - Ambulatory referral to Urology   Time Spent: 25 minutes face-to-face with this patient discussing problems, treatments, and answering patient's questions.       The patient was given  clear instructions to go to ER or return to medical center if symptoms do not improve, worsen or new problems develop. The patient verbalized understanding and agreed with plan of care.   Ms. Doug Sou. Nathaneil Canary, FNP-BC [facility] [address] [place], [place] [address] [phone]     This note has been created with Dragon speech recognition software and smart phrase technology. Any transcriptional errors are unintentional.

## 2018-07-30 NOTE — Progress Notes (Signed)
    Postoperative Access Visit   History of Present Illness   Jack Irwin is a 53 y.o. year old male who presents for postoperative follow-up for: left upper arm loop graft (Date: 07/07/18).  The patient's wounds are well healed.  The patient denies steal symptoms.  The patient is able to complete their activities of daily living.  He is currently dialyzing via right IJ tunneled dialysis catheter on a Monday Wednesday Friday schedule in [place].   Physical Examination   Vitals:   07/30/18 1458  BP: 97/70  Pulse: 82  Resp: 18  Temp: 97.8 F (36.6 C)  TempSrc: Temporal  SpO2: 97%  Weight: 257 lb (116.6 kg)  Height: 5\' 11"  (1.803 m)   Body mass index is 35.84 kg/m.  left arm Incisions are healed, hand grip is 5/5, sensation in digits is intact, palpable thrill, bruit can be auscultated, palpable left radial pulse    Medical Decision Making   Jack Irwin is a 53 y.o. year old male who presents s/p left upper arm loop graft   Patent AVG without signs or symptoms of steal syndrome  The patient's access will be ready for use 08/03/18  The patient's tunneled dialysis catheter can be removed when Nephrology is comfortable with the performance of the graft  The patient may follow up on a prn basis   Dagoberto Ligas PA-C [facility] Office: [phone]  Clinic MD: Dr. Donzetta Matters

## 2018-07-30 NOTE — Patient Instructions (Signed)
I referred you to the urologist for further evaluation of the erectile dysfunction. I am doing labs to check your calcium

## 2018-07-31 LAB — PTH, INTACT AND CALCIUM
Calcium: 10.1 mg/dL (ref 8.7–10.2)
PTH: 207 pg/mL — ABNORMAL HIGH (ref 15–65)

## 2018-08-02 ENCOUNTER — Other Ambulatory Visit: Payer: Self-pay | Admitting: Family Medicine

## 2018-08-02 DIAGNOSIS — Z7901 Long term (current) use of anticoagulants: Secondary | ICD-10-CM

## 2018-08-02 DIAGNOSIS — D509 Iron deficiency anemia, unspecified: Secondary | ICD-10-CM | POA: Diagnosis not present

## 2018-08-02 DIAGNOSIS — N2581 Secondary hyperparathyroidism of renal origin: Secondary | ICD-10-CM | POA: Diagnosis not present

## 2018-08-02 DIAGNOSIS — R112 Nausea with vomiting, unspecified: Secondary | ICD-10-CM | POA: Diagnosis not present

## 2018-08-02 DIAGNOSIS — D631 Anemia in chronic kidney disease: Secondary | ICD-10-CM | POA: Diagnosis not present

## 2018-08-02 DIAGNOSIS — N186 End stage renal disease: Secondary | ICD-10-CM | POA: Diagnosis not present

## 2018-08-02 DIAGNOSIS — Z992 Dependence on renal dialysis: Secondary | ICD-10-CM | POA: Diagnosis not present

## 2018-08-02 NOTE — Progress Notes (Signed)
Patient's parathyroid is elevated. Usually, this is treated with a vitamin d supplement. He has been prescribed Calcitrol in the past. Please verify if he is taking this medication. He can have an increased PTH due to him being on dialysis.

## 2018-08-03 ENCOUNTER — Telehealth: Payer: Self-pay

## 2018-08-03 DIAGNOSIS — Z992 Dependence on renal dialysis: Secondary | ICD-10-CM | POA: Diagnosis not present

## 2018-08-03 DIAGNOSIS — N186 End stage renal disease: Secondary | ICD-10-CM | POA: Diagnosis not present

## 2018-08-03 NOTE — Telephone Encounter (Signed)
-----   Message from Lanae Boast, [place] sent at 08/02/2018  6:07 PM EDT ----- Patient's parathyroid is elevated. Usually, this is treated with a vitamin d supplement. He has been prescribed Calcitrol in the past. Please verify if he is taking this medication. He can have an increased PTH due to him being on dialysis.

## 2018-08-03 NOTE — Telephone Encounter (Signed)
Called and spoke with patient, advised that parathyroid is elevated and that this could be due to dialysis. He states he is not taking Calcitrol at this time and is asking if we can send this in. I have sent message to provider. Thanks!

## 2018-08-04 ENCOUNTER — Telehealth: Payer: Self-pay

## 2018-08-04 DIAGNOSIS — R112 Nausea with vomiting, unspecified: Secondary | ICD-10-CM | POA: Diagnosis not present

## 2018-08-04 DIAGNOSIS — D509 Iron deficiency anemia, unspecified: Secondary | ICD-10-CM | POA: Diagnosis not present

## 2018-08-04 DIAGNOSIS — N2581 Secondary hyperparathyroidism of renal origin: Secondary | ICD-10-CM | POA: Diagnosis not present

## 2018-08-04 DIAGNOSIS — Z992 Dependence on renal dialysis: Secondary | ICD-10-CM | POA: Diagnosis not present

## 2018-08-04 DIAGNOSIS — N186 End stage renal disease: Secondary | ICD-10-CM | POA: Diagnosis not present

## 2018-08-04 MED ORDER — DICLOFENAC SODIUM 1 % TD GEL: 2.0000 g | Freq: Four times a day (QID) | TRANSDERMAL | 0 refills | Status: AC

## 2018-08-04 MED ORDER — CALCITRIOL 0.25 MCG PO CAPS: 0.2500 ug | ORAL_CAPSULE | Freq: Every day | ORAL | 0 refills | Status: AC

## 2018-08-04 NOTE — Addendum Note (Signed)
Addended by: Genelle Bal on: 08/04/2018 02:47 PM   Modules accepted: Orders

## 2018-08-04 NOTE — Progress Notes (Signed)
Please let patient know that I sent in a prescription for Calcitrol. He will need to follow up with his nephrologist.

## 2018-08-04 NOTE — Telephone Encounter (Signed)
Patient called and is asking for some cream for gout??? He said it starts with a V and he has used it before. Please advise.

## 2018-08-05 ENCOUNTER — Ambulatory Visit (HOSPITAL_COMMUNITY)
Admission: RE | Admit: 2018-08-05 | Discharge: 2018-08-05 | Disposition: A | Discharge status: Home / Self Care | Payer: Medicare Other | Source: Ambulatory Visit | Attending: Urology | Admitting: Urology

## 2018-08-05 ENCOUNTER — Other Ambulatory Visit: Payer: Self-pay

## 2018-08-05 DIAGNOSIS — R102 Pelvic and perineal pain: Secondary | ICD-10-CM | POA: Diagnosis not present

## 2018-08-05 DIAGNOSIS — N182 Chronic kidney disease, stage 2 (mild): Secondary | ICD-10-CM | POA: Diagnosis not present

## 2018-08-05 DIAGNOSIS — M109 Gout, unspecified: Secondary | ICD-10-CM

## 2018-08-05 DIAGNOSIS — Z7901 Long term (current) use of anticoagulants: Secondary | ICD-10-CM

## 2018-08-05 MED ORDER — APIXABAN 2.5 MG PO TABS: 2.5000 mg | ORAL_TABLET | Freq: Two times a day (BID) | ORAL | 0 refills | Status: DC

## 2018-08-05 MED ORDER — ULORIC 40 MG PO TABS: 40.0000 mg | ORAL_TABLET | Freq: Every day | ORAL | 5 refills | Status: DC

## 2018-08-06 DIAGNOSIS — D509 Iron deficiency anemia, unspecified: Secondary | ICD-10-CM | POA: Diagnosis not present

## 2018-08-06 DIAGNOSIS — R112 Nausea with vomiting, unspecified: Secondary | ICD-10-CM | POA: Diagnosis not present

## 2018-08-06 DIAGNOSIS — N2581 Secondary hyperparathyroidism of renal origin: Secondary | ICD-10-CM | POA: Diagnosis not present

## 2018-08-06 DIAGNOSIS — N186 End stage renal disease: Secondary | ICD-10-CM | POA: Diagnosis not present

## 2018-08-06 DIAGNOSIS — Z992 Dependence on renal dialysis: Secondary | ICD-10-CM | POA: Diagnosis not present

## 2018-08-09 DIAGNOSIS — N2581 Secondary hyperparathyroidism of renal origin: Secondary | ICD-10-CM | POA: Diagnosis not present

## 2018-08-09 DIAGNOSIS — R112 Nausea with vomiting, unspecified: Secondary | ICD-10-CM | POA: Diagnosis not present

## 2018-08-09 DIAGNOSIS — Z992 Dependence on renal dialysis: Secondary | ICD-10-CM | POA: Diagnosis not present

## 2018-08-09 DIAGNOSIS — N186 End stage renal disease: Secondary | ICD-10-CM | POA: Diagnosis not present

## 2018-08-09 DIAGNOSIS — D509 Iron deficiency anemia, unspecified: Secondary | ICD-10-CM | POA: Diagnosis not present

## 2018-08-10 ENCOUNTER — Ambulatory Visit: Payer: Medicaid Other | Admitting: Orthotics

## 2018-08-10 ENCOUNTER — Telehealth: Payer: Self-pay

## 2018-08-10 DIAGNOSIS — Z452 Encounter for adjustment and management of vascular access device: Secondary | ICD-10-CM | POA: Diagnosis not present

## 2018-08-10 NOTE — Telephone Encounter (Signed)
Patient called here and said his podiatrist can not do the diabetic shoes for him. He has to have a visit with MD. Please advise what we need to tell him. Thanks !

## 2018-08-11 ENCOUNTER — Ambulatory Visit (INDEPENDENT_AMBULATORY_CARE_PROVIDER_SITE_OTHER): Payer: Medicare Other | Admitting: Urology

## 2018-08-11 ENCOUNTER — Telehealth: Payer: Self-pay | Admitting: Podiatry

## 2018-08-11 DIAGNOSIS — R102 Pelvic and perineal pain: Secondary | ICD-10-CM | POA: Diagnosis not present

## 2018-08-11 DIAGNOSIS — N186 End stage renal disease: Secondary | ICD-10-CM | POA: Diagnosis not present

## 2018-08-11 DIAGNOSIS — Z992 Dependence on renal dialysis: Secondary | ICD-10-CM | POA: Diagnosis not present

## 2018-08-11 DIAGNOSIS — D509 Iron deficiency anemia, unspecified: Secondary | ICD-10-CM | POA: Diagnosis not present

## 2018-08-11 DIAGNOSIS — R112 Nausea with vomiting, unspecified: Secondary | ICD-10-CM | POA: Diagnosis not present

## 2018-08-11 DIAGNOSIS — N2581 Secondary hyperparathyroidism of renal origin: Secondary | ICD-10-CM | POA: Diagnosis not present

## 2018-08-11 NOTE — Telephone Encounter (Signed)
Agree with this plan.

## 2018-08-11 NOTE — Telephone Encounter (Signed)
Called and spoke with patient advised that he will need to see MD for diabetic shoes. Advised him he will need to [facility] provider[facility]

## 2018-08-11 NOTE — Telephone Encounter (Signed)
Received call from pcp office and pt called them stating he needed paperwork for his diabetic shoes to be signed off on.She said that they have not received any paperwork on this pt or several other pts.  I explained that pt is currently seen by the Fnp and per medicare guidelines pt has to be seen by MD/DO and pt was told that he needed to call and make an appt with the MD/DO and let us know so we can get him scheduled to pick out his shoes and get paperwork to the MD/DO.

## 2018-08-13 DIAGNOSIS — R112 Nausea with vomiting, unspecified: Secondary | ICD-10-CM | POA: Diagnosis not present

## 2018-08-13 DIAGNOSIS — D509 Iron deficiency anemia, unspecified: Secondary | ICD-10-CM | POA: Diagnosis not present

## 2018-08-13 DIAGNOSIS — N186 End stage renal disease: Secondary | ICD-10-CM | POA: Diagnosis not present

## 2018-08-13 DIAGNOSIS — N2581 Secondary hyperparathyroidism of renal origin: Secondary | ICD-10-CM | POA: Diagnosis not present

## 2018-08-13 DIAGNOSIS — Z992 Dependence on renal dialysis: Secondary | ICD-10-CM | POA: Diagnosis not present

## 2018-08-16 ENCOUNTER — Telehealth: Payer: Self-pay

## 2018-08-16 DIAGNOSIS — R112 Nausea with vomiting, unspecified: Secondary | ICD-10-CM | POA: Diagnosis not present

## 2018-08-16 DIAGNOSIS — E119 Type 2 diabetes mellitus without complications: Secondary | ICD-10-CM | POA: Diagnosis not present

## 2018-08-16 DIAGNOSIS — Z992 Dependence on renal dialysis: Secondary | ICD-10-CM | POA: Diagnosis not present

## 2018-08-16 DIAGNOSIS — D509 Iron deficiency anemia, unspecified: Secondary | ICD-10-CM | POA: Diagnosis not present

## 2018-08-16 DIAGNOSIS — N186 End stage renal disease: Secondary | ICD-10-CM | POA: Diagnosis not present

## 2018-08-16 DIAGNOSIS — N2581 Secondary hyperparathyroidism of renal origin: Secondary | ICD-10-CM | POA: Diagnosis not present

## 2018-08-16 NOTE — Telephone Encounter (Signed)
Called, no answer. Left a message. Thanks!

## 2018-08-18 DIAGNOSIS — Z992 Dependence on renal dialysis: Secondary | ICD-10-CM | POA: Diagnosis not present

## 2018-08-18 DIAGNOSIS — N186 End stage renal disease: Secondary | ICD-10-CM | POA: Diagnosis not present

## 2018-08-18 DIAGNOSIS — N2581 Secondary hyperparathyroidism of renal origin: Secondary | ICD-10-CM | POA: Diagnosis not present

## 2018-08-18 DIAGNOSIS — D509 Iron deficiency anemia, unspecified: Secondary | ICD-10-CM | POA: Diagnosis not present

## 2018-08-18 DIAGNOSIS — R112 Nausea with vomiting, unspecified: Secondary | ICD-10-CM | POA: Diagnosis not present

## 2018-08-20 ENCOUNTER — Other Ambulatory Visit: Payer: Self-pay | Admitting: Internal Medicine

## 2018-08-20 ENCOUNTER — Other Ambulatory Visit: Payer: Self-pay

## 2018-08-20 DIAGNOSIS — Z992 Dependence on renal dialysis: Secondary | ICD-10-CM | POA: Diagnosis not present

## 2018-08-20 DIAGNOSIS — N2581 Secondary hyperparathyroidism of renal origin: Secondary | ICD-10-CM | POA: Diagnosis not present

## 2018-08-20 DIAGNOSIS — N186 End stage renal disease: Secondary | ICD-10-CM | POA: Diagnosis not present

## 2018-08-20 DIAGNOSIS — D509 Iron deficiency anemia, unspecified: Secondary | ICD-10-CM | POA: Diagnosis not present

## 2018-08-20 DIAGNOSIS — R112 Nausea with vomiting, unspecified: Secondary | ICD-10-CM | POA: Diagnosis not present

## 2018-08-20 MED ORDER — ONDANSETRON 4 MG PO TBDP: 4.0000 mg | ORAL_TABLET | Freq: Three times a day (TID) | ORAL | 0 refills | Status: DC | PRN

## 2018-08-24 DIAGNOSIS — D509 Iron deficiency anemia, unspecified: Secondary | ICD-10-CM | POA: Diagnosis not present

## 2018-08-24 DIAGNOSIS — N186 End stage renal disease: Secondary | ICD-10-CM | POA: Diagnosis not present

## 2018-08-24 DIAGNOSIS — N2581 Secondary hyperparathyroidism of renal origin: Secondary | ICD-10-CM | POA: Diagnosis not present

## 2018-08-24 DIAGNOSIS — R112 Nausea with vomiting, unspecified: Secondary | ICD-10-CM | POA: Diagnosis not present

## 2018-08-24 DIAGNOSIS — Z992 Dependence on renal dialysis: Secondary | ICD-10-CM | POA: Diagnosis not present

## 2018-08-25 DIAGNOSIS — R112 Nausea with vomiting, unspecified: Secondary | ICD-10-CM | POA: Diagnosis not present

## 2018-08-25 DIAGNOSIS — N186 End stage renal disease: Secondary | ICD-10-CM | POA: Diagnosis not present

## 2018-08-25 DIAGNOSIS — N2581 Secondary hyperparathyroidism of renal origin: Secondary | ICD-10-CM | POA: Diagnosis not present

## 2018-08-25 DIAGNOSIS — D509 Iron deficiency anemia, unspecified: Secondary | ICD-10-CM | POA: Diagnosis not present

## 2018-08-25 DIAGNOSIS — Z992 Dependence on renal dialysis: Secondary | ICD-10-CM | POA: Diagnosis not present

## 2018-08-27 DIAGNOSIS — N186 End stage renal disease: Secondary | ICD-10-CM | POA: Diagnosis not present

## 2018-08-27 DIAGNOSIS — D509 Iron deficiency anemia, unspecified: Secondary | ICD-10-CM | POA: Diagnosis not present

## 2018-08-27 DIAGNOSIS — R112 Nausea with vomiting, unspecified: Secondary | ICD-10-CM | POA: Diagnosis not present

## 2018-08-27 DIAGNOSIS — Z992 Dependence on renal dialysis: Secondary | ICD-10-CM | POA: Diagnosis not present

## 2018-08-27 DIAGNOSIS — N2581 Secondary hyperparathyroidism of renal origin: Secondary | ICD-10-CM | POA: Diagnosis not present

## 2018-08-30 DIAGNOSIS — N2581 Secondary hyperparathyroidism of renal origin: Secondary | ICD-10-CM | POA: Diagnosis not present

## 2018-08-30 DIAGNOSIS — Z992 Dependence on renal dialysis: Secondary | ICD-10-CM | POA: Diagnosis not present

## 2018-08-30 DIAGNOSIS — D509 Iron deficiency anemia, unspecified: Secondary | ICD-10-CM | POA: Diagnosis not present

## 2018-08-30 DIAGNOSIS — N186 End stage renal disease: Secondary | ICD-10-CM | POA: Diagnosis not present

## 2018-08-30 DIAGNOSIS — R112 Nausea with vomiting, unspecified: Secondary | ICD-10-CM | POA: Diagnosis not present

## 2018-08-31 ENCOUNTER — Other Ambulatory Visit: Payer: Self-pay

## 2018-08-31 MED ORDER — COLCHICINE 0.6 MG PO TABS: 0.3000 mg | ORAL_TABLET | Freq: Every day | ORAL | 3 refills | Status: AC | PRN

## 2018-09-01 DIAGNOSIS — R112 Nausea with vomiting, unspecified: Secondary | ICD-10-CM | POA: Diagnosis not present

## 2018-09-01 DIAGNOSIS — N2581 Secondary hyperparathyroidism of renal origin: Secondary | ICD-10-CM | POA: Diagnosis not present

## 2018-09-01 DIAGNOSIS — N186 End stage renal disease: Secondary | ICD-10-CM | POA: Diagnosis not present

## 2018-09-01 DIAGNOSIS — D509 Iron deficiency anemia, unspecified: Secondary | ICD-10-CM | POA: Diagnosis not present

## 2018-09-01 DIAGNOSIS — Z992 Dependence on renal dialysis: Secondary | ICD-10-CM | POA: Diagnosis not present

## 2018-09-03 DIAGNOSIS — N186 End stage renal disease: Secondary | ICD-10-CM | POA: Diagnosis not present

## 2018-09-03 DIAGNOSIS — N2581 Secondary hyperparathyroidism of renal origin: Secondary | ICD-10-CM | POA: Diagnosis not present

## 2018-09-03 DIAGNOSIS — D509 Iron deficiency anemia, unspecified: Secondary | ICD-10-CM | POA: Diagnosis not present

## 2018-09-03 DIAGNOSIS — Z992 Dependence on renal dialysis: Secondary | ICD-10-CM | POA: Diagnosis not present

## 2018-09-03 DIAGNOSIS — R112 Nausea with vomiting, unspecified: Secondary | ICD-10-CM | POA: Diagnosis not present

## 2018-09-06 DIAGNOSIS — N2581 Secondary hyperparathyroidism of renal origin: Secondary | ICD-10-CM | POA: Diagnosis not present

## 2018-09-06 DIAGNOSIS — D509 Iron deficiency anemia, unspecified: Secondary | ICD-10-CM | POA: Diagnosis not present

## 2018-09-06 DIAGNOSIS — R112 Nausea with vomiting, unspecified: Secondary | ICD-10-CM | POA: Diagnosis not present

## 2018-09-06 DIAGNOSIS — Z992 Dependence on renal dialysis: Secondary | ICD-10-CM | POA: Diagnosis not present

## 2018-09-06 DIAGNOSIS — N186 End stage renal disease: Secondary | ICD-10-CM | POA: Diagnosis not present

## 2018-09-07 ENCOUNTER — Other Ambulatory Visit: Payer: Self-pay

## 2018-09-07 ENCOUNTER — Ambulatory Visit: Payer: Medicare Other | Admitting: Orthotics

## 2018-09-07 DIAGNOSIS — L84 Corns and callosities: Secondary | ICD-10-CM

## 2018-09-07 DIAGNOSIS — E1142 Type 2 diabetes mellitus with diabetic polyneuropathy: Secondary | ICD-10-CM

## 2018-09-07 MED ORDER — ONDANSETRON 4 MG PO TBDP: 4.0000 mg | ORAL_TABLET | Freq: Three times a day (TID) | ORAL | 0 refills | Status: DC | PRN

## 2018-09-07 NOTE — Progress Notes (Signed)
Being seen by FNP, will get visit with doc and call Dawn with info.  He was cast today and chose [id number]

## 2018-09-08 ENCOUNTER — Ambulatory Visit: Payer: Self-pay | Admitting: Family Medicine

## 2018-09-08 DIAGNOSIS — D509 Iron deficiency anemia, unspecified: Secondary | ICD-10-CM | POA: Diagnosis not present

## 2018-09-08 DIAGNOSIS — R112 Nausea with vomiting, unspecified: Secondary | ICD-10-CM | POA: Diagnosis not present

## 2018-09-08 DIAGNOSIS — N186 End stage renal disease: Secondary | ICD-10-CM | POA: Diagnosis not present

## 2018-09-08 DIAGNOSIS — Z992 Dependence on renal dialysis: Secondary | ICD-10-CM | POA: Diagnosis not present

## 2018-09-08 DIAGNOSIS — N2581 Secondary hyperparathyroidism of renal origin: Secondary | ICD-10-CM | POA: Diagnosis not present

## 2018-09-10 DIAGNOSIS — D509 Iron deficiency anemia, unspecified: Secondary | ICD-10-CM | POA: Diagnosis not present

## 2018-09-10 DIAGNOSIS — Z992 Dependence on renal dialysis: Secondary | ICD-10-CM | POA: Diagnosis not present

## 2018-09-10 DIAGNOSIS — N186 End stage renal disease: Secondary | ICD-10-CM | POA: Diagnosis not present

## 2018-09-10 DIAGNOSIS — N2581 Secondary hyperparathyroidism of renal origin: Secondary | ICD-10-CM | POA: Diagnosis not present

## 2018-09-10 DIAGNOSIS — R112 Nausea with vomiting, unspecified: Secondary | ICD-10-CM | POA: Diagnosis not present

## 2018-09-13 DIAGNOSIS — Z992 Dependence on renal dialysis: Secondary | ICD-10-CM | POA: Diagnosis not present

## 2018-09-13 DIAGNOSIS — N2581 Secondary hyperparathyroidism of renal origin: Secondary | ICD-10-CM | POA: Diagnosis not present

## 2018-09-13 DIAGNOSIS — R112 Nausea with vomiting, unspecified: Secondary | ICD-10-CM | POA: Diagnosis not present

## 2018-09-13 DIAGNOSIS — D509 Iron deficiency anemia, unspecified: Secondary | ICD-10-CM | POA: Diagnosis not present

## 2018-09-13 DIAGNOSIS — N186 End stage renal disease: Secondary | ICD-10-CM | POA: Diagnosis not present

## 2018-09-14 ENCOUNTER — Other Ambulatory Visit: Payer: Self-pay | Admitting: Family Medicine

## 2018-09-14 ENCOUNTER — Ambulatory Visit: Payer: Medicaid Other | Admitting: Podiatry

## 2018-09-15 DIAGNOSIS — D509 Iron deficiency anemia, unspecified: Secondary | ICD-10-CM | POA: Diagnosis not present

## 2018-09-15 DIAGNOSIS — Z992 Dependence on renal dialysis: Secondary | ICD-10-CM | POA: Diagnosis not present

## 2018-09-15 DIAGNOSIS — N2581 Secondary hyperparathyroidism of renal origin: Secondary | ICD-10-CM | POA: Diagnosis not present

## 2018-09-15 DIAGNOSIS — R112 Nausea with vomiting, unspecified: Secondary | ICD-10-CM | POA: Diagnosis not present

## 2018-09-15 DIAGNOSIS — N186 End stage renal disease: Secondary | ICD-10-CM | POA: Diagnosis not present

## 2018-09-16 ENCOUNTER — Other Ambulatory Visit: Payer: Self-pay

## 2018-09-16 ENCOUNTER — Ambulatory Visit (INDEPENDENT_AMBULATORY_CARE_PROVIDER_SITE_OTHER): Payer: Medicare Other | Admitting: Family Medicine

## 2018-09-16 ENCOUNTER — Encounter: Payer: Self-pay | Admitting: Family Medicine

## 2018-09-16 VITALS — BP 124/81 | HR 91 | Temp 97.6°F | Resp 16 | Ht 71.0 in | Wt 262.0 lb

## 2018-09-16 DIAGNOSIS — R110 Nausea: Secondary | ICD-10-CM

## 2018-09-16 DIAGNOSIS — K0889 Other specified disorders of teeth and supporting structures: Secondary | ICD-10-CM | POA: Diagnosis not present

## 2018-09-16 DIAGNOSIS — I10 Essential (primary) hypertension: Secondary | ICD-10-CM | POA: Diagnosis not present

## 2018-09-16 DIAGNOSIS — M109 Gout, unspecified: Secondary | ICD-10-CM

## 2018-09-16 DIAGNOSIS — N186 End stage renal disease: Secondary | ICD-10-CM | POA: Diagnosis not present

## 2018-09-16 DIAGNOSIS — Z992 Dependence on renal dialysis: Secondary | ICD-10-CM | POA: Diagnosis not present

## 2018-09-16 DIAGNOSIS — E1122 Type 2 diabetes mellitus with diabetic chronic kidney disease: Secondary | ICD-10-CM | POA: Diagnosis not present

## 2018-09-16 LAB — POCT GLYCOSYLATED HEMOGLOBIN (HGB A1C): Hemoglobin A1C: 5.4 % (ref 4.0–5.6)

## 2018-09-16 LAB — GLUCOSE, POCT (MANUAL RESULT ENTRY): POC Glucose: 93 mg/dl (ref 70–99)

## 2018-09-16 MED ORDER — ULORIC 40 MG PO TABS: 40.0000 mg | ORAL_TABLET | Freq: Every day | ORAL | 5 refills | Status: AC

## 2018-09-16 MED ORDER — ONDANSETRON 4 MG PO TBDP: 4.0000 mg | ORAL_TABLET | Freq: Three times a day (TID) | ORAL | 0 refills | Status: DC | PRN

## 2018-09-16 MED ORDER — GABAPENTIN 100 MG PO CAPS: 100.0000 mg | ORAL_CAPSULE | Freq: Every day | ORAL | 2 refills | Status: AC

## 2018-09-16 NOTE — Progress Notes (Signed)
Patient [facility]   Progress Note: General Provider: Lanae Boast, FNP  SUBJECTIVE:   Jack Irwin is a 53 y.o. male who  has a past medical history of Anemia, Arthritis, BPH (benign prostatic hyperplasia), Chronic back pain, Difficulty walking, DVT (deep venous thrombosis) ([place]), Dyspnea, End-stage renal disease on hemodialysis ([place]), Erectile dysfunction, Essential hypertension, Gout, History of blood transfusion, Neuropathy, diabetic ([place]), Type 2 diabetes mellitus ([place]), and Venous (peripheral) insufficiency.. Patient presents today for Hypertension, Nausea (wants something for nausea ), Diabetes, and Medication Problem (can't take calcitiol ) Patient brought in last labs. His his calcium and PTH have normalized. He was instructed to d/c calcitrol for now. Continues with dialysis 3 days per week.  His last A1C was 5.2. Labs are being scanned in the medical records. He states that he is having nausea and would like for his zofran to be refilled. He states that he needs to have MD appointment for diabetic foot exam for shoes. Also states that he is having upper right tooth pain. Has not used or taken anything for pain.    Review of Systems  Constitutional: Negative.   HENT: Negative.        Tooth pain  Eyes: Negative.   Respiratory: Negative.   Cardiovascular: Negative.   Gastrointestinal: Negative.   Genitourinary: Negative.   Musculoskeletal: Negative.   Skin: Negative.   Neurological: Negative.   Psychiatric/Behavioral: Negative.      OBJECTIVE: BP 124/81 (BP Location: Right Arm, Patient Position: Sitting, Cuff Size: Large)   Pulse 91   Temp 97.6 F (36.4 C) (Oral)   Resp 16   Ht 5\' 11"  (1.803 m)   Wt 262 lb (118.8 kg)   SpO2 99%   BMI 36.54 kg/m   Wt Readings from Last 3 Encounters:  09/16/18 262 lb (118.8 kg)  07/30/18 257 lb (116.6 kg)  07/30/18 259 lb (117.5 kg)     Physical Exam Vitals signs and nursing note  reviewed.  Constitutional:      General: He is not in acute distress.    Appearance: Normal appearance.  HENT:     Head: Normocephalic and atraumatic.     Mouth/Throat:     Lips: Pink.     Mouth: Mucous membranes are moist.     Dentition: No gingival swelling or dental abscesses.     Pharynx: Oropharynx is clear. Uvula midline.   Eyes:     Extraocular Movements: Extraocular movements intact.     Conjunctiva/sclera: Conjunctivae normal.     Pupils: Pupils are equal, round, and reactive to light.  Cardiovascular:     Rate and Rhythm: Normal rate and regular rhythm.     Heart sounds: No murmur.  Pulmonary:     Effort: Pulmonary effort is normal.     Breath sounds: Normal breath sounds.  Musculoskeletal: Normal range of motion.  Skin:    General: Skin is warm and dry.  Neurological:     Mental Status: He is alert and oriented to person, place, and time.  Psychiatric:        Mood and Affect: Mood normal.        Behavior: Behavior normal.        Thought Content: Thought content normal.        Judgment: Judgment normal.     ASSESSMENT/PLAN: Refilled requested medications.   1. Essential hypertension - Urinalysis Dipstick  2. Type 2 diabetes mellitus with chronic kidney disease on chronic dialysis, without  long-term current use of insulin (HCC) - HgB A1c - Urinalysis Dipstick - Glucose (CBG)  3. Arthritis, gouty - ULORIC 40 MG tablet; Take 1 tablet (40 mg total) by mouth daily.  Dispense: 30 tablet; Refill: 5  4. Nausea - ondansetron (ZOFRAN ODT) 4 MG disintegrating tablet; Take 1 tablet (4 mg total) by mouth every 8 (eight) hours as needed for nausea or vomiting.  Dispense: 6 tablet; Refill: 0  5. Pain, dental   No medication changes. Patient advised to rinse mouth with warm salt water after every meal and to follow up with a dentist. Advised to call number on back of his insurance card for in-network providers. Patient also instructed to set up appt with Dr. Doreene Burke,  MD for diabetic foot exam. Call podiatry to send paperwork to this office.       Return in about 3 months (around 12/17/2018), or appt with Dr. Doreene Burke for DM foot exam and shoes, for DM2.    The patient was given clear instructions to go to ER or return to medical center if symptoms do not improve, worsen or new problems develop. The patient verbalized understanding and agreed with plan of care.   Ms. Doug Sou. Nathaneil Canary, FNP-BC [facility] [address] [place], [place] [address] [phone]

## 2018-09-16 NOTE — Patient Instructions (Signed)

## 2018-09-17 DIAGNOSIS — R112 Nausea with vomiting, unspecified: Secondary | ICD-10-CM | POA: Diagnosis not present

## 2018-09-17 DIAGNOSIS — N186 End stage renal disease: Secondary | ICD-10-CM | POA: Diagnosis not present

## 2018-09-17 DIAGNOSIS — Z992 Dependence on renal dialysis: Secondary | ICD-10-CM | POA: Diagnosis not present

## 2018-09-17 DIAGNOSIS — N2581 Secondary hyperparathyroidism of renal origin: Secondary | ICD-10-CM | POA: Diagnosis not present

## 2018-09-17 DIAGNOSIS — D509 Iron deficiency anemia, unspecified: Secondary | ICD-10-CM | POA: Diagnosis not present

## 2018-09-20 DIAGNOSIS — N186 End stage renal disease: Secondary | ICD-10-CM | POA: Diagnosis not present

## 2018-09-20 DIAGNOSIS — Z992 Dependence on renal dialysis: Secondary | ICD-10-CM | POA: Diagnosis not present

## 2018-09-20 DIAGNOSIS — N2581 Secondary hyperparathyroidism of renal origin: Secondary | ICD-10-CM | POA: Diagnosis not present

## 2018-09-20 DIAGNOSIS — D509 Iron deficiency anemia, unspecified: Secondary | ICD-10-CM | POA: Diagnosis not present

## 2018-09-20 DIAGNOSIS — R112 Nausea with vomiting, unspecified: Secondary | ICD-10-CM | POA: Diagnosis not present

## 2018-09-23 ENCOUNTER — Ambulatory Visit: Payer: Self-pay | Admitting: Family Medicine

## 2018-09-24 DIAGNOSIS — D509 Iron deficiency anemia, unspecified: Secondary | ICD-10-CM | POA: Diagnosis not present

## 2018-09-24 DIAGNOSIS — Z992 Dependence on renal dialysis: Secondary | ICD-10-CM | POA: Diagnosis not present

## 2018-09-24 DIAGNOSIS — R112 Nausea with vomiting, unspecified: Secondary | ICD-10-CM | POA: Diagnosis not present

## 2018-09-24 DIAGNOSIS — N2581 Secondary hyperparathyroidism of renal origin: Secondary | ICD-10-CM | POA: Diagnosis not present

## 2018-09-24 DIAGNOSIS — N186 End stage renal disease: Secondary | ICD-10-CM | POA: Diagnosis not present

## 2018-09-27 DIAGNOSIS — N186 End stage renal disease: Secondary | ICD-10-CM | POA: Diagnosis not present

## 2018-09-27 DIAGNOSIS — N2581 Secondary hyperparathyroidism of renal origin: Secondary | ICD-10-CM | POA: Diagnosis not present

## 2018-09-27 DIAGNOSIS — Z992 Dependence on renal dialysis: Secondary | ICD-10-CM | POA: Diagnosis not present

## 2018-09-27 DIAGNOSIS — D509 Iron deficiency anemia, unspecified: Secondary | ICD-10-CM | POA: Diagnosis not present

## 2018-09-27 DIAGNOSIS — R112 Nausea with vomiting, unspecified: Secondary | ICD-10-CM | POA: Diagnosis not present

## 2018-09-29 ENCOUNTER — Telehealth: Payer: Self-pay | Admitting: Family Medicine

## 2018-09-29 DIAGNOSIS — N2581 Secondary hyperparathyroidism of renal origin: Secondary | ICD-10-CM | POA: Diagnosis not present

## 2018-09-29 DIAGNOSIS — R112 Nausea with vomiting, unspecified: Secondary | ICD-10-CM | POA: Diagnosis not present

## 2018-09-29 DIAGNOSIS — N186 End stage renal disease: Secondary | ICD-10-CM | POA: Diagnosis not present

## 2018-09-29 DIAGNOSIS — Z992 Dependence on renal dialysis: Secondary | ICD-10-CM | POA: Diagnosis not present

## 2018-09-29 DIAGNOSIS — D509 Iron deficiency anemia, unspecified: Secondary | ICD-10-CM | POA: Diagnosis not present

## 2018-09-29 NOTE — Telephone Encounter (Signed)
Can you give something for constipation?

## 2018-09-29 NOTE — Telephone Encounter (Signed)
la 

## 2018-09-29 NOTE — Telephone Encounter (Signed)
error 

## 2018-09-30 MED ORDER — DOCUSATE SODIUM 100 MG PO CAPS: 100.0000 mg | ORAL_CAPSULE | Freq: Two times a day (BID) | ORAL | 0 refills | Status: DC

## 2018-09-30 NOTE — Addendum Note (Signed)
Addended by: Genelle Bal on: 09/30/2018 03:40 PM   Modules accepted: Orders

## 2018-10-01 ENCOUNTER — Emergency Department (HOSPITAL_COMMUNITY)
Admission: EM | Admit: 2018-10-01 | Discharge: 2018-10-01 | Disposition: A | Discharge status: Home / Self Care | Payer: Medicare Other | Attending: Emergency Medicine | Admitting: Emergency Medicine

## 2018-10-01 ENCOUNTER — Encounter (HOSPITAL_COMMUNITY): Payer: Self-pay

## 2018-10-01 ENCOUNTER — Other Ambulatory Visit: Payer: Self-pay

## 2018-10-01 DIAGNOSIS — Z7901 Long term (current) use of anticoagulants: Secondary | ICD-10-CM | POA: Insufficient documentation

## 2018-10-01 DIAGNOSIS — K0889 Other specified disorders of teeth and supporting structures: Secondary | ICD-10-CM | POA: Diagnosis present

## 2018-10-01 DIAGNOSIS — I120 Hypertensive chronic kidney disease with stage 5 chronic kidney disease or end stage renal disease: Secondary | ICD-10-CM | POA: Insufficient documentation

## 2018-10-01 DIAGNOSIS — Z992 Dependence on renal dialysis: Secondary | ICD-10-CM | POA: Insufficient documentation

## 2018-10-01 DIAGNOSIS — E119 Type 2 diabetes mellitus without complications: Secondary | ICD-10-CM | POA: Insufficient documentation

## 2018-10-01 DIAGNOSIS — S025XXA Fracture of tooth (traumatic), initial encounter for closed fracture: Secondary | ICD-10-CM

## 2018-10-01 DIAGNOSIS — Y939 Activity, unspecified: Secondary | ICD-10-CM | POA: Diagnosis not present

## 2018-10-01 DIAGNOSIS — N186 End stage renal disease: Secondary | ICD-10-CM | POA: Insufficient documentation

## 2018-10-01 DIAGNOSIS — Y929 Unspecified place or not applicable: Secondary | ICD-10-CM | POA: Diagnosis not present

## 2018-10-01 DIAGNOSIS — X58XXXA Exposure to other specified factors, initial encounter: Secondary | ICD-10-CM | POA: Insufficient documentation

## 2018-10-01 DIAGNOSIS — N2581 Secondary hyperparathyroidism of renal origin: Secondary | ICD-10-CM | POA: Diagnosis not present

## 2018-10-01 DIAGNOSIS — R112 Nausea with vomiting, unspecified: Secondary | ICD-10-CM | POA: Diagnosis not present

## 2018-10-01 DIAGNOSIS — Z79899 Other long term (current) drug therapy: Secondary | ICD-10-CM | POA: Insufficient documentation

## 2018-10-01 DIAGNOSIS — Y999 Unspecified external cause status: Secondary | ICD-10-CM | POA: Insufficient documentation

## 2018-10-01 DIAGNOSIS — D509 Iron deficiency anemia, unspecified: Secondary | ICD-10-CM | POA: Diagnosis not present

## 2018-10-01 MED ORDER — AMOXICILLIN 500 MG PO CAPS: 500.0000 mg | ORAL_CAPSULE | Freq: Three times a day (TID) | ORAL | 0 refills | Status: DC

## 2018-10-01 MED ORDER — LIDOCAINE-EPINEPHRINE (PF) 2 %-1:200000 IJ SOLN
10.0000 mL | Freq: Once | INTRAMUSCULAR | Status: AC
  Administered 2018-10-01: 10 mL
  Filled 2018-10-01: qty 10

## 2018-10-01 MED ORDER — HYDROCODONE-ACETAMINOPHEN 5-325 MG PO TABS: 1.0000 | ORAL_TABLET | ORAL | 0 refills | Status: AC | PRN

## 2018-10-01 NOTE — ED Provider Notes (Signed)
[facility] EMERGENCY DEPARTMENT Provider Note   CSN: [id number] Arrival date & time: 10/01/18  0016     History   Chief Complaint Chief Complaint  Patient presents with  . Dental Pain    HPI Jack Irwin is a 53 y.o. male.     Patient presents to the emergency department for evaluation of a broken tooth.  He reports that he was chewing on ice and the tooth broke.  He does take Eliquis, did not take his nighttime dose.  There has not been any bleeding.  He is not experiencing much pain, but the fractured fragment is dangling in his mouth and he thinks it should be removed.     Past Medical History:  Diagnosis Date  . Anemia   . Arthritis   . BPH (benign prostatic hyperplasia)   . Chronic back pain   . Difficulty walking   . DVT (deep venous thrombosis) ([place])    Right popliteal DVT December 2017  . Dyspnea    with exertion  . End-stage renal disease on hemodialysis ([place])    Dr. Lowanda Foster  - dialysis M/WF  . Erectile dysfunction   . Essential hypertension    history of, now- hypotenison.  . Gout   . History of blood transfusion   . Neuropathy, diabetic ([place])   . Type 2 diabetes mellitus ([place])   . Venous (peripheral) insufficiency     Patient Active Problem List   Diagnosis Date Noted  . Onychomycosis 06/17/2016  . Constipation 03/05/2016  . Anemia in chronic kidney disease (CKD) 03/05/2016  . Bleeding from wound 03/03/2016  . Chronic anticoagulation 03/03/2016  . Scrotal abscess 02/25/2016  . Type 2 diabetes mellitus ([place]) 02/25/2016  . Acute venous embolism and thrombosis of deep vessels of proximal lower extremity ([place]) [I82.4Y9] 02/11/2016  . Encounter for therapeutic drug monitoring 02/11/2016  . Nausea & vomiting 11/17/2014  . Dependence on renal dialysis ([place]) 04/02/2014  . Dilated cardiomyopathy ([place]) 04/02/2014  . Osteopathy in diseases classified elsewhere, unspecified site 04/02/2014  . Thrombocytopenia ([place]) 02/21/2014  . Morbid obesity  ([place]) 03/30/2013  . RBBB 03/29/2013  . Hyperkalemia 03/28/2013  . Leukocytosis 03/28/2013  . Nausea vomiting and diarrhea 03/28/2013  . BBB (bundle branch block) 03/28/2013  . Hypertension   . ESRD on dialysis ([place])   . Lack of coordination 04/15/2012  . Muscle weakness (generalized) 04/15/2012  . Arthritis, gouty 04/15/2012  . Difficulty walking 08/18/2011  . Weakness of both legs 08/18/2011  . Gout 01/22/2011  . Heart failure ([place]) 01/22/2011  . Paroxysmal tachycardia ([place]) 01/22/2011    Past Surgical History:  Procedure Laterality Date  . AV FISTULA PLACEMENT Left 03/09/2014   Procedure: INSERTION OF ARTERIOVENOUS (AV) GORE-TEX GRAFT ARM;  Surgeon: Angelia Mould, MD;  Location[facility];  Service: Vascular;  Laterality: Left;  . AV FISTULA PLACEMENT Right 05/06/2016   Procedure: INSERTION OF ARTERIOVENOUS (AV) GORE-TEX GRAFT  Right ARM;  Surgeon: Elam Dutch, MD;  Location: [facility];  Service: Vascular;  Laterality: Right;  . AV FISTULA PLACEMENT Right 12/15/2017   Procedure: ARTERIOVENOUS (AV) BRACHIOCEPHALIC FISTULA CREATION;  Surgeon: Elam Dutch, MD;  Location: [facility];  Service: Vascular;  Laterality: Right;  . AV FISTULA PLACEMENT Left 07/06/2018   Procedure: INSERTION OF ARTERIOVENOUS LOOP GRAFT LEFT UPPER ARM;  Surgeon: Elam Dutch, MD;  Location: [facility];  Service: Vascular;  Laterality: Left;  . CYST EXCISION Right    cyst removed on thumb 2001  .  CYSTOSCOPY N/A 09/23/2017   Procedure: CYSTOSCOPY;  Surgeon: Cleon Gustin, MD;  Location: [facility];  Service: Urology;  Laterality: N/A;  . INSERTION OF DIALYSIS CATHETER    . INSERTION OF DIALYSIS CATHETER Left 03/09/2014   Procedure: INSERTION OF DIALYSIS CATHETER;  Surgeon: Angelia Mould, MD;  Location: [facility];  Service: Vascular;  Laterality: Left;  . IR THROMBECTOMY AV FISTULA W/THROMBOLYSIS/PTA INC/SHUNT/IMG RIGHT Right 11/01/2016  . IR US GUIDE VASC ACCESS RIGHT  11/01/2016  . IRRIGATION AND  DEBRIDEMENT ABSCESS N/A 02/25/2016   Procedure: IRRIGATION AND DEBRIDEMENT SCROTAL ABSCESS;  Surgeon: Nickie Retort, MD;  Location: [facility];  Service: Urology;  Laterality: N/A;  . UPPER EXTREMITY VENOGRAPHY Left 04/08/2018   Procedure: UPPER EXTREMITY VENOGRAPHY;  Surgeon: Marty Heck, MD;  Location: [facility];  Service: Cardiovascular;  Laterality: Left;        Home Medications    Prior to Admission medications   Medication Sig Start Date End Date Taking? Authorizing Provider  acetaminophen (TYLENOL) 500 MG tablet Take 1,000 mg by mouth every 6 (six) hours as needed for mild pain.     [provider]  ammonium lactate (AMLACTIN) 12 % lotion Apply 1 application topically as needed for dry skin. 03/10/18   Lanae Boast, FNP  amoxicillin (AMOXIL) 500 MG capsule Take 1 capsule (500 mg total) by mouth 3 (three) times daily. 10/01/18   Orpah Greek, MD  apixaban (ELIQUIS) 2.5 MG TABS tablet Take 1 tablet (2.5 mg total) by mouth 2 (two) times daily. 08/05/18   Lanae Boast, FNP  Blood Pressure Monitor DEVI 1 Device by Does not apply route daily. 06/22/18   Lanae Boast, FNP  clotrimazole (LOTRIMIN) 1 % cream Apply to both feet and between toes twice daily Patient taking differently: Apply 1 application topically 2 (two) times daily as needed. Apply to both feet and between toes twice daily 06/08/18   Marzetta Board, DPM  clotrimazole-betamethasone (LOTRISONE) cream Apply 1 application topically 2 (two) times daily as needed (boils).    [provider]  colchicine 0.6 MG tablet Take 0.5 tablets (0.3 mg total) by mouth daily as needed (for gout). 08/31/18   Lanae Boast, FNP  diclofenac sodium (VOLTAREN) 1 % GEL Apply 2 g topically 4 (four) times daily. 08/04/18   Lanae Boast, FNP  docusate sodium (COLACE) 100 MG capsule Take 1 capsule (100 mg total) by mouth 2 (two) times daily. 09/30/18   Lanae Boast, FNP  finasteride (PROSCAR) 5 MG tablet Take  5 mg by mouth daily.  09/30/17   [provider]  gabapentin (NEURONTIN) 100 MG capsule Take 1 capsule (100 mg total) by mouth daily. 09/16/18   Lanae Boast, FNP  HYDROcodone-acetaminophen (NORCO/VICODIN) 5-325 MG tablet Take 1-2 tablets by mouth every 4 (four) hours as needed. 10/01/18   Orpah Greek, MD  midodrine (PROAMATINE) 10 MG tablet Take 10 mg by mouth See admin instructions. 1 tablet by mouth 45 minutes before dialysis 06/07/18   [provider]  ondansetron (ZOFRAN ODT) 4 MG disintegrating tablet Take 1 tablet (4 mg total) by mouth every 8 (eight) hours as needed for nausea or vomiting. 09/16/18   Lanae Boast, FNP  oxyCODONE (ROXICODONE) 5 MG immediate release tablet Take 1 tablet (5 mg total) by mouth every 6 (six) hours as needed. Patient not taking: Reported on 07/30/2018 07/06/18   Gabriel Earing, PA-C  Pseudoephedrine-APAP-DM (DAYQUIL PO) Take 1 tablet by mouth every 6 (six) hours  as needed (cold symptoms).    [provider]  sevelamer carbonate (RENVELA) 800 MG tablet Take 2-4 tablets (1,600-3,200 mg total) by mouth See admin instructions. Take 3200 mg with each meal and take 1600 mg with each snack 06/17/18   Lanae Boast, FNP  sildenafil (REVATIO) 20 MG tablet DISSOLVE 1/2 TROCHE UNDER THE TONGUE 60 MINUTES PRIOR TO SEXUAL ENCOUNTER. 07/20/18   Lanae Boast, FNP  ULORIC 40 MG tablet Take 1 tablet (40 mg total) by mouth daily. 09/16/18   Lanae Boast, FNP    Family History Family History  Problem Relation Age of Onset  . Diabetes Mother   . Hypertension Mother   . Heart failure Mother   . Hyperlipidemia Mother   . Heart attack Mother   . Cancer Father   . Diabetes Father   . Hypertension Father   . Hyperlipidemia Father     Social History Social History   Tobacco Use  . Smoking status: Never Smoker  . Smokeless tobacco: Never Used  Substance Use Topics  . Alcohol use: No    Alcohol/week: 0.0 standard drinks  . Drug use: No      Allergies   Doxycycline   Review of Systems Review of Systems  HENT: Positive for dental problem.      Physical Exam Updated Vital Signs BP 120/85 (BP Location: Right Arm)   Pulse 96   Temp 97.9 F (36.6 C) (Oral)   Resp (!) 21   Ht 5\' 11"  (1.803 m)   Wt 118.8 kg   SpO2 99%   BMI 36.54 kg/m   Physical Exam Vitals signs and nursing note reviewed.  Constitutional:      Appearance: Normal appearance.  HENT:     Mouth/Throat:   Cardiovascular:     Rate and Rhythm: Normal rate and regular rhythm.  Pulmonary:     Effort: Pulmonary effort is normal.     Breath sounds: Normal breath sounds.  Musculoskeletal: Normal range of motion.  Skin:    General: Skin is warm and dry.  Neurological:     General: No focal deficit present.     Mental Status: He is alert and oriented to person, place, and time.      ED Treatments / Results  Labs (all labs ordered are listed, but only abnormal results are displayed) Labs Reviewed - No data to display  EKG None  Radiology No results found.  Procedures Dental Block  Date/Time: 10/01/2018 2:09 AM Performed by: Orpah Greek, MD Authorized by: Orpah Greek, MD   Consent:    Consent obtained:  Verbal   Consent given by:  Patient   Risks discussed:  Allergic reaction and infection Universal protocol:    Procedure explained and questions answered to patient or proxy's satisfaction: yes     Site/side marked: yes     Immediately prior to procedure, a time out was called: yes     Patient identity confirmed:  Verbally with patient Indications:    Indications: procedural anesthesia   Location:    Block type:  Supraperiosteal   Supraperiosteal location:  Upper teeth   Upper teeth location:  3/RU 1st molar Procedure details (see MAR for exact dosages):    Needle gauge:  25 G   Anesthetic injected:  Lidocaine 2% WITH epi   Injection procedure:  Anatomic landmarks identified, introduced needle,  incremental injection, anatomic landmarks palpated and negative aspiration for blood Post-procedure details:    Outcome:  Anesthesia achieved   Patient  tolerance of procedure:  Tolerated well, no immediate complications Comments:     After anesthesia was achieved, the tooth fragment was grasped with hemostat and removed with traction.  No bleeding.   (including critical care time)  Medications Ordered in ED Medications  lidocaine-EPINEPHrine (XYLOCAINE W/EPI) 2 %-1:200000 (PF) injection 10 mL (has no administration in time range)     Initial Impression / Assessment and Plan / ED Course  I have reviewed the triage vital signs and the nursing notes.  Pertinent labs & imaging results that were available during my care of the patient were reviewed by me and considered in my medical decision making (see chart for details).        Patient presents to the ER for dental fracture.  Right upper premolar broke earlier tonight when he was chewing on ice.  There was a small fragment still attached to the gingiva.  Patient is on Eliquis.  Discussed with him the fact that there could be bleeding if we tried to remove it but he felt that it was too uncomfortable to leave in place.  Area was injected directly with lidocaine 2% with epinephrine and then dental fragment grasped with hemostat and removed.  No bleeding noted after procedure.  Patient tolerated without difficulty.  Final Clinical Impressions(s) / ED Diagnoses   Final diagnoses:  Closed fracture of tooth, initial encounter    ED Discharge Orders         Ordered    HYDROcodone-acetaminophen (NORCO/VICODIN) 5-325 MG tablet  Every 4 hours PRN     10/01/18 0211    amoxicillin (AMOXIL) 500 MG capsule  3 times daily     10/01/18 0211           Orpah Greek, MD 10/01/18 [phone]

## 2018-10-01 NOTE — ED Triage Notes (Signed)
Pt states he chipped a tooth while chewing on ice

## 2018-10-04 DIAGNOSIS — R112 Nausea with vomiting, unspecified: Secondary | ICD-10-CM | POA: Diagnosis not present

## 2018-10-04 DIAGNOSIS — D509 Iron deficiency anemia, unspecified: Secondary | ICD-10-CM | POA: Diagnosis not present

## 2018-10-04 DIAGNOSIS — Z992 Dependence on renal dialysis: Secondary | ICD-10-CM | POA: Diagnosis not present

## 2018-10-04 DIAGNOSIS — N2581 Secondary hyperparathyroidism of renal origin: Secondary | ICD-10-CM | POA: Diagnosis not present

## 2018-10-04 DIAGNOSIS — N186 End stage renal disease: Secondary | ICD-10-CM | POA: Diagnosis not present

## 2018-10-04 MED FILL — Hydrocodone-Acetaminophen Tab 5-325 MG: ORAL | Qty: 6 | Status: AC

## 2018-10-05 ENCOUNTER — Other Ambulatory Visit: Payer: Self-pay | Admitting: Family Medicine

## 2018-10-05 ENCOUNTER — Telehealth: Payer: Self-pay | Admitting: Family Medicine

## 2018-10-05 DIAGNOSIS — R112 Nausea with vomiting, unspecified: Secondary | ICD-10-CM | POA: Diagnosis not present

## 2018-10-05 DIAGNOSIS — Z992 Dependence on renal dialysis: Secondary | ICD-10-CM | POA: Diagnosis not present

## 2018-10-05 DIAGNOSIS — D509 Iron deficiency anemia, unspecified: Secondary | ICD-10-CM | POA: Diagnosis not present

## 2018-10-05 DIAGNOSIS — Z7901 Long term (current) use of anticoagulants: Secondary | ICD-10-CM

## 2018-10-05 DIAGNOSIS — R110 Nausea: Secondary | ICD-10-CM

## 2018-10-05 DIAGNOSIS — N2581 Secondary hyperparathyroidism of renal origin: Secondary | ICD-10-CM | POA: Diagnosis not present

## 2018-10-05 DIAGNOSIS — N186 End stage renal disease: Secondary | ICD-10-CM | POA: Diagnosis not present

## 2018-10-05 IMAGING — CT CT ABD-PELV W/O CM
2 of 4 series · 16 of 46 positions shown, 18 images · non-contrast
Comparison: 12/09/2016 ultrasound, 06/10/2016 MR, 08/01/2015 CT and
other studies.

CLINICAL DATA: 51-year-old male with hematuria for 3 days. Patient
with end-stage renal disease dialysis.

EXAM:
CT ABDOMEN AND PELVIS WITHOUT CONTRAST
TECHNIQUE: Multidetector CT imaging of the abdomen and pelvis was performed
following the standard protocol without IV contrast.

[Series 2: axial st · axial · 0.91mm/px · z∈[-512,-76]mm · 13 of 99 slices shown, 15 images]
[im 6/99  soft-tissue]
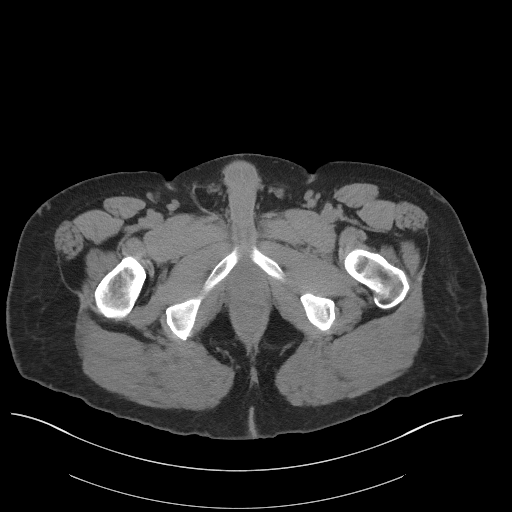
[im 6/99  bone]
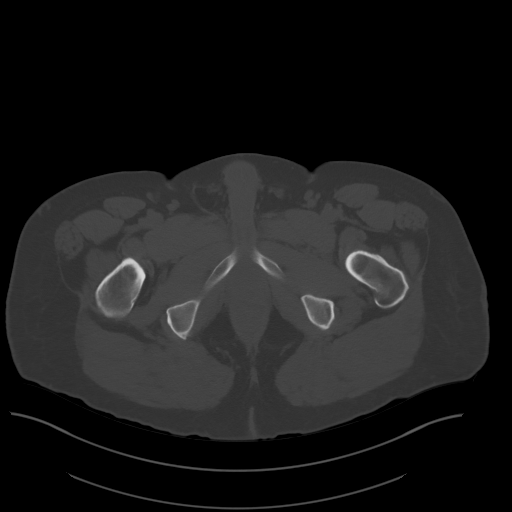
[im 12/99  soft-tissue]
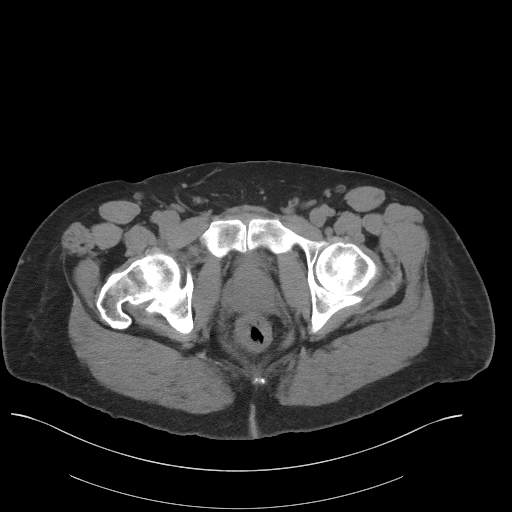
[im 24/99  soft-tissue]
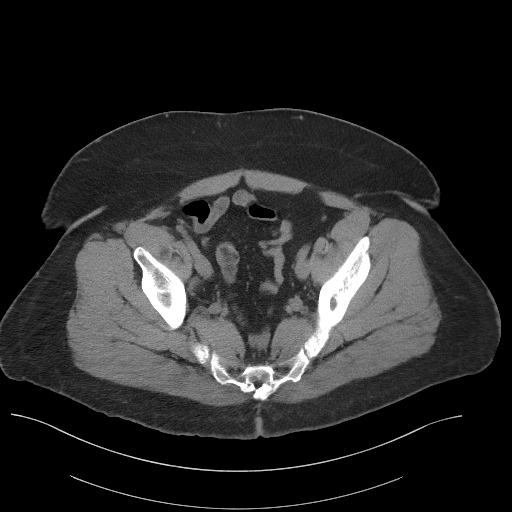
[im 29/99  soft-tissue]
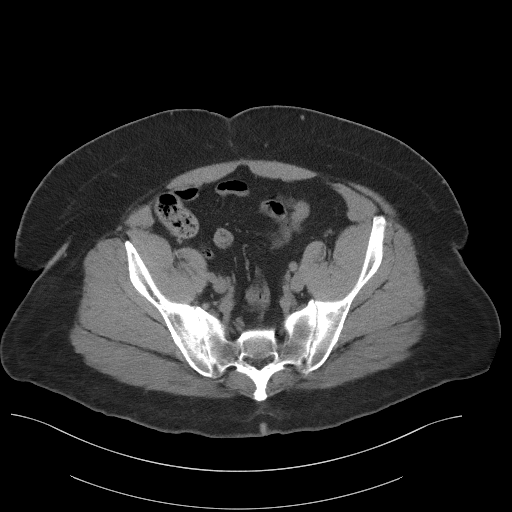
[im 35/99  soft-tissue]
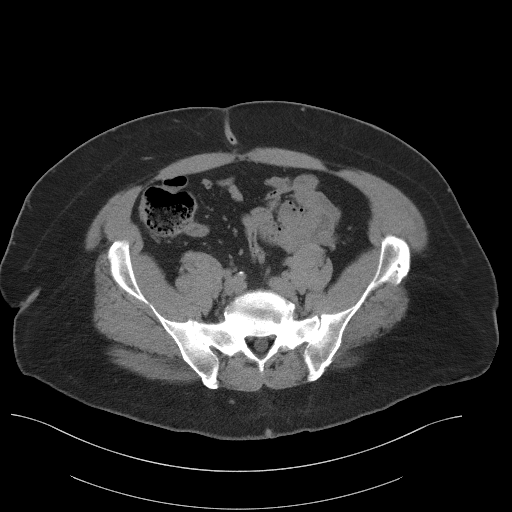
[im 41/99  soft-tissue]
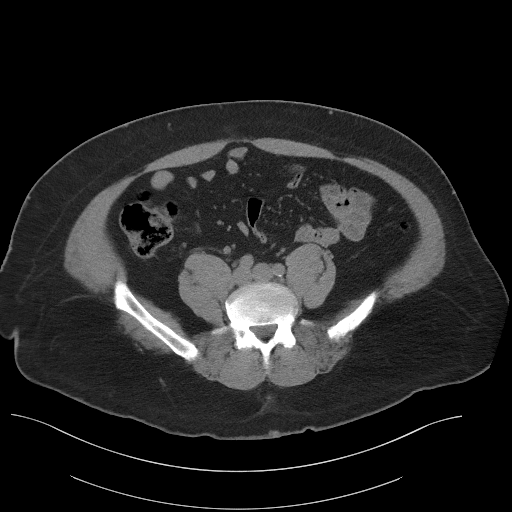
[im 52/99  soft-tissue]
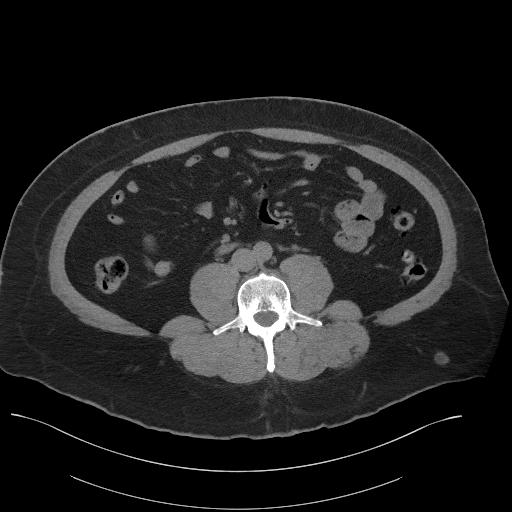
[im 58/99  soft-tissue]
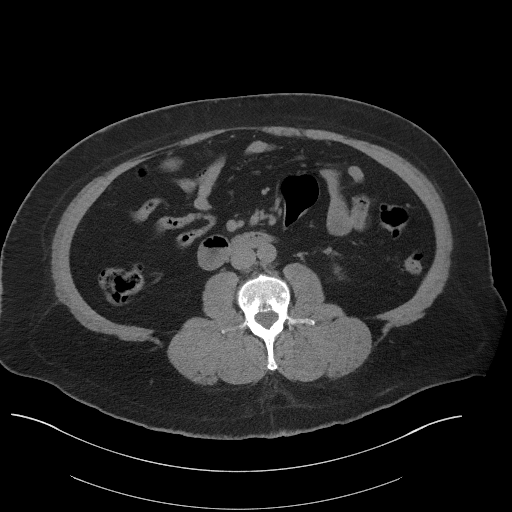
[im 64/99  soft-tissue]
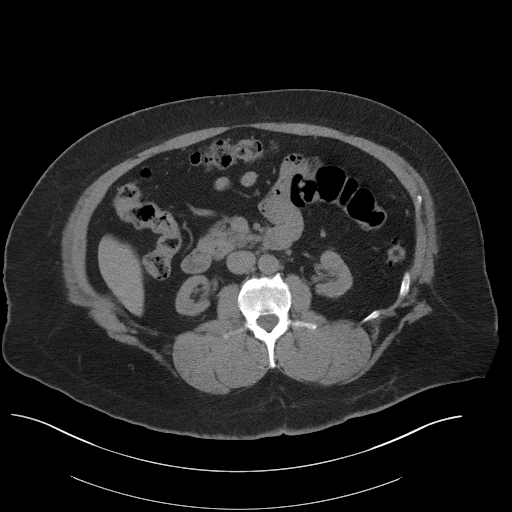
[im 64/99  bone]
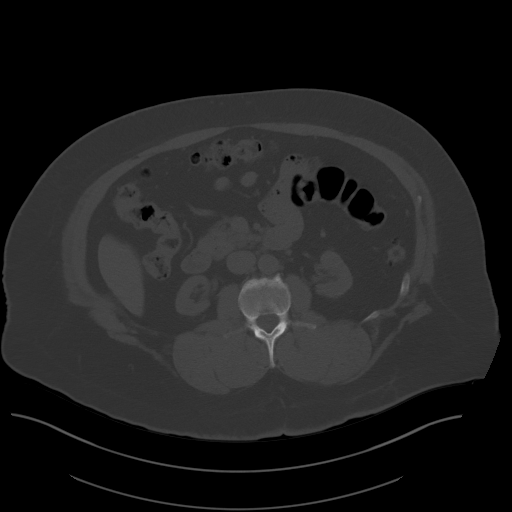
[im 70/99  soft-tissue]
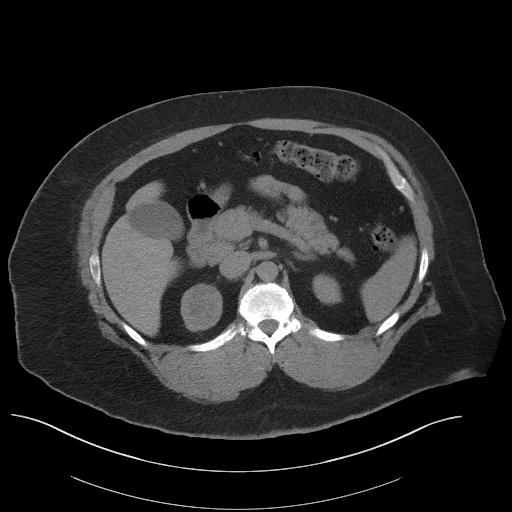
[im 75/99  soft-tissue]
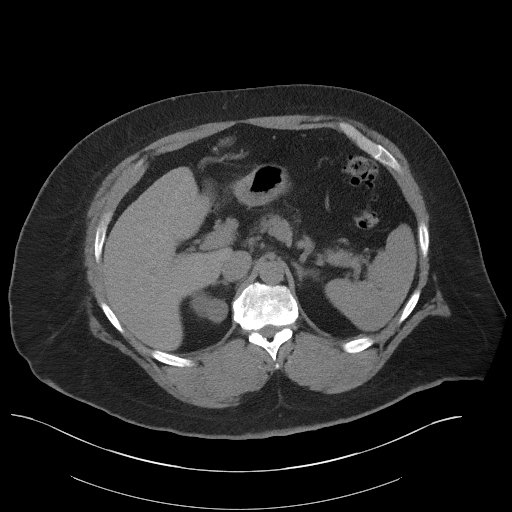
[im 87/99  soft-tissue]
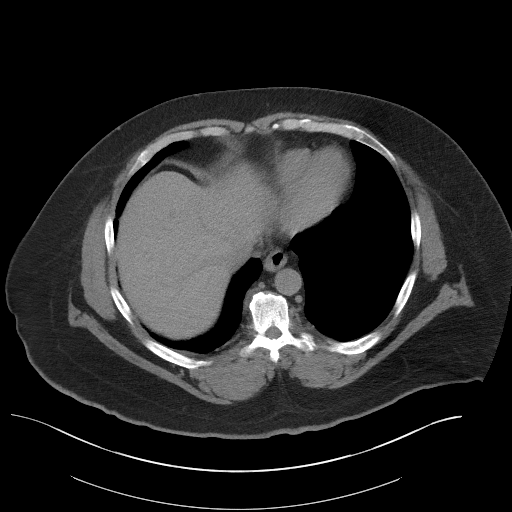
[im 93/99  soft-tissue]
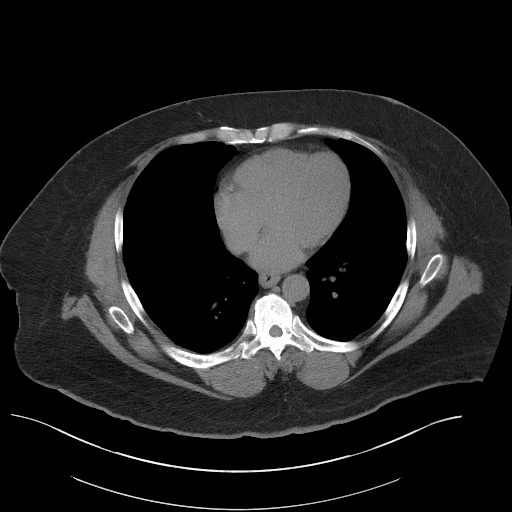

[Series 4: coronal st · coronal · 0.99mm/px · 3 of 133 slices shown]
[im 45/133  soft-tissue]
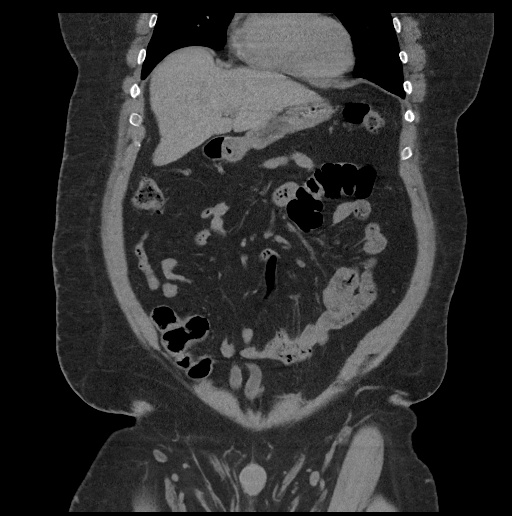
[im 59/133  soft-tissue]
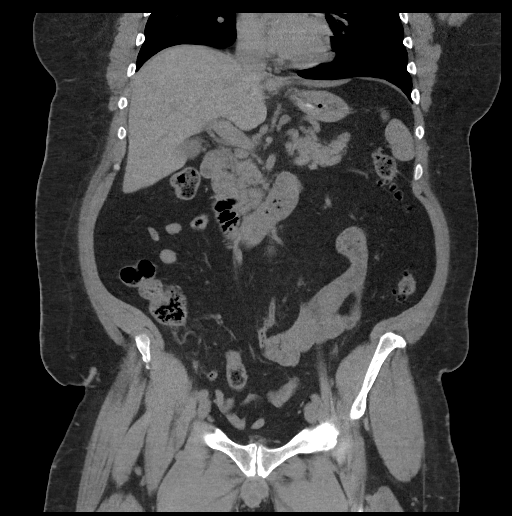
[im 74/133  soft-tissue]
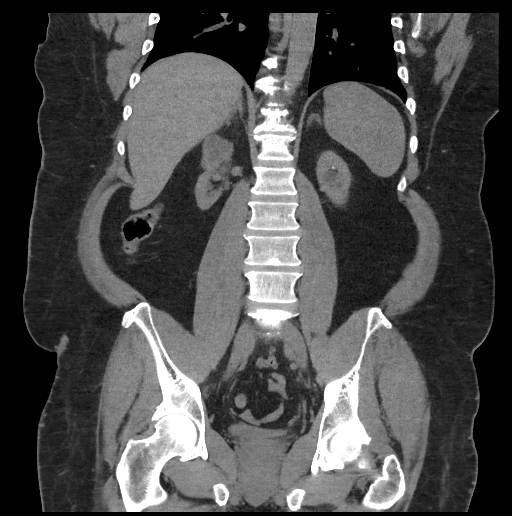

[16 of 46 positions shown; findings below may reference images not displayed]

FINDINGS: Please note that parenchymal abnormalities may be missed without
intravenous contrast.

Lower chest: No acute abnormality.

Hepatobiliary: The liver and gallbladder are unremarkable. No
biliary dilatation.

Pancreas: Unremarkable

Spleen: Unremarkable

Adrenals/Urinary Tract: The complex lesion within the UPPER RIGHT
kidney appears slightly increased in size now measuring 3.5 x 4 x
4.5 cm ([DATE]), previously measuring 3 x 3.5 x 4.5 cm on 06/10/2016
MR.

SOULEY 1.8 x 1.7 x 1.8 cm mass within the anterior aspect of the mid LEFT
kidney ([DATE]) is unchanged. Atrophic kidneys with nonobstructing
LEFT renal calculi noted.

No significant adrenal abnormalities noted. The bladder is
collapsed..

Stomach/Bowel: Stomach is within normal limits. Appendix appears
normal. No evidence of bowel wall thickening, distention, or
inflammatory changes. Colonic diverticulosis noted without evidence
of diverticulitis.

Vascular/Lymphatic: No significant vascular findings are present. No
enlarged abdominal or pelvic lymph nodes.

Reproductive: Prostate enlargement again noted.

Other: No ascites, focal collection or pneumoperitoneum.

Musculoskeletal: No acute abnormality or suspicious bony lesion.
IMPRESSION: 1. Apparent slightly enlarging complex lesion within the UPPER RIGHT
kidney. Given prior findings, it is difficult to determine if this
represents a benign complex cyst or slightly enlarging cystic
neoplasm.
2. Unchanged 1.8 cm indeterminate lesion within the anterior LEFT
mid kidney.
3. If the patient is on dialysis and no other contraindications to
receiving CT IV contrast, strongly consider abdominal CT with and
without contrast to evaluate any enhancement of the above renal
lesions.
4. Atrophic kidneys with nonobstructing LEFT renal calculi.
5. Prostate enlargement

## 2018-10-05 MED ORDER — ONDANSETRON 4 MG PO TBDP: 4.0000 mg | ORAL_TABLET | Freq: Three times a day (TID) | ORAL | 0 refills | Status: AC | PRN

## 2018-10-05 NOTE — Telephone Encounter (Signed)
Refilled the zofran to [facility] in [place].

## 2018-10-06 DIAGNOSIS — N186 End stage renal disease: Secondary | ICD-10-CM | POA: Diagnosis not present

## 2018-10-06 DIAGNOSIS — Z992 Dependence on renal dialysis: Secondary | ICD-10-CM | POA: Diagnosis not present

## 2018-10-06 DIAGNOSIS — T82858A Stenosis of vascular prosthetic devices, implants and grafts, initial encounter: Secondary | ICD-10-CM | POA: Diagnosis not present

## 2018-10-06 DIAGNOSIS — I871 Compression of vein: Secondary | ICD-10-CM | POA: Diagnosis not present

## 2018-10-08 DIAGNOSIS — R112 Nausea with vomiting, unspecified: Secondary | ICD-10-CM | POA: Diagnosis not present

## 2018-10-08 DIAGNOSIS — N186 End stage renal disease: Secondary | ICD-10-CM | POA: Diagnosis not present

## 2018-10-08 DIAGNOSIS — N2581 Secondary hyperparathyroidism of renal origin: Secondary | ICD-10-CM | POA: Diagnosis not present

## 2018-10-08 DIAGNOSIS — D509 Iron deficiency anemia, unspecified: Secondary | ICD-10-CM | POA: Diagnosis not present

## 2018-10-08 DIAGNOSIS — Z992 Dependence on renal dialysis: Secondary | ICD-10-CM | POA: Diagnosis not present

## 2018-10-11 DIAGNOSIS — Z992 Dependence on renal dialysis: Secondary | ICD-10-CM | POA: Diagnosis not present

## 2018-10-11 DIAGNOSIS — N2581 Secondary hyperparathyroidism of renal origin: Secondary | ICD-10-CM | POA: Diagnosis not present

## 2018-10-11 DIAGNOSIS — N186 End stage renal disease: Secondary | ICD-10-CM | POA: Diagnosis not present

## 2018-10-11 DIAGNOSIS — R112 Nausea with vomiting, unspecified: Secondary | ICD-10-CM | POA: Diagnosis not present

## 2018-10-11 DIAGNOSIS — D509 Iron deficiency anemia, unspecified: Secondary | ICD-10-CM | POA: Diagnosis not present

## 2018-10-13 ENCOUNTER — Encounter (HOSPITAL_COMMUNITY): Payer: Self-pay | Admitting: *Deleted

## 2018-10-13 ENCOUNTER — Encounter (HOSPITAL_COMMUNITY): Payer: Self-pay

## 2018-10-13 DIAGNOSIS — D509 Iron deficiency anemia, unspecified: Secondary | ICD-10-CM | POA: Diagnosis not present

## 2018-10-13 DIAGNOSIS — Z992 Dependence on renal dialysis: Secondary | ICD-10-CM | POA: Diagnosis not present

## 2018-10-13 DIAGNOSIS — N2581 Secondary hyperparathyroidism of renal origin: Secondary | ICD-10-CM | POA: Diagnosis not present

## 2018-10-13 DIAGNOSIS — R112 Nausea with vomiting, unspecified: Secondary | ICD-10-CM | POA: Diagnosis not present

## 2018-10-13 DIAGNOSIS — N186 End stage renal disease: Secondary | ICD-10-CM | POA: Diagnosis not present

## 2018-10-15 ENCOUNTER — Encounter (HOSPITAL_COMMUNITY): Payer: Self-pay | Admitting: Nephrology

## 2018-10-15 DIAGNOSIS — N2581 Secondary hyperparathyroidism of renal origin: Secondary | ICD-10-CM | POA: Diagnosis not present

## 2018-10-15 DIAGNOSIS — D509 Iron deficiency anemia, unspecified: Secondary | ICD-10-CM | POA: Diagnosis not present

## 2018-10-15 DIAGNOSIS — N186 End stage renal disease: Secondary | ICD-10-CM | POA: Diagnosis not present

## 2018-10-15 DIAGNOSIS — R112 Nausea with vomiting, unspecified: Secondary | ICD-10-CM | POA: Diagnosis not present

## 2018-10-15 DIAGNOSIS — Z992 Dependence on renal dialysis: Secondary | ICD-10-CM | POA: Diagnosis not present

## 2018-10-18 DIAGNOSIS — N186 End stage renal disease: Secondary | ICD-10-CM | POA: Diagnosis not present

## 2018-10-18 DIAGNOSIS — Z992 Dependence on renal dialysis: Secondary | ICD-10-CM | POA: Diagnosis not present

## 2018-10-18 DIAGNOSIS — N2581 Secondary hyperparathyroidism of renal origin: Secondary | ICD-10-CM | POA: Diagnosis not present

## 2018-10-18 DIAGNOSIS — R112 Nausea with vomiting, unspecified: Secondary | ICD-10-CM | POA: Diagnosis not present

## 2018-10-18 DIAGNOSIS — D509 Iron deficiency anemia, unspecified: Secondary | ICD-10-CM | POA: Diagnosis not present

## 2018-10-20 DIAGNOSIS — N186 End stage renal disease: Secondary | ICD-10-CM | POA: Diagnosis not present

## 2018-10-20 DIAGNOSIS — R112 Nausea with vomiting, unspecified: Secondary | ICD-10-CM | POA: Diagnosis not present

## 2018-10-20 DIAGNOSIS — Z992 Dependence on renal dialysis: Secondary | ICD-10-CM | POA: Diagnosis not present

## 2018-10-20 DIAGNOSIS — N2581 Secondary hyperparathyroidism of renal origin: Secondary | ICD-10-CM | POA: Diagnosis not present

## 2018-10-20 DIAGNOSIS — D509 Iron deficiency anemia, unspecified: Secondary | ICD-10-CM | POA: Diagnosis not present

## 2018-10-21 ENCOUNTER — Telehealth: Payer: Self-pay | Admitting: Orthotics

## 2018-10-21 DIAGNOSIS — L84 Corns and callosities: Secondary | ICD-10-CM

## 2018-10-21 DIAGNOSIS — E1142 Type 2 diabetes mellitus with diabetic polyneuropathy: Secondary | ICD-10-CM

## 2018-10-21 NOTE — Telephone Encounter (Signed)
Talked to patient re appointment with DR; he said he is busy moving to [facility] and needs to get dr. There;  Right now he is no longer interested in [facility].

## 2018-10-22 DIAGNOSIS — N186 End stage renal disease: Secondary | ICD-10-CM | POA: Diagnosis not present

## 2018-10-22 DIAGNOSIS — N2581 Secondary hyperparathyroidism of renal origin: Secondary | ICD-10-CM | POA: Diagnosis not present

## 2018-10-22 DIAGNOSIS — R112 Nausea with vomiting, unspecified: Secondary | ICD-10-CM | POA: Diagnosis not present

## 2018-10-22 DIAGNOSIS — Z992 Dependence on renal dialysis: Secondary | ICD-10-CM | POA: Diagnosis not present

## 2018-10-22 DIAGNOSIS — D509 Iron deficiency anemia, unspecified: Secondary | ICD-10-CM | POA: Diagnosis not present

## 2018-10-25 DIAGNOSIS — R112 Nausea with vomiting, unspecified: Secondary | ICD-10-CM | POA: Diagnosis not present

## 2018-10-25 DIAGNOSIS — N186 End stage renal disease: Secondary | ICD-10-CM | POA: Diagnosis not present

## 2018-10-25 DIAGNOSIS — Z992 Dependence on renal dialysis: Secondary | ICD-10-CM | POA: Diagnosis not present

## 2018-10-25 DIAGNOSIS — D509 Iron deficiency anemia, unspecified: Secondary | ICD-10-CM | POA: Diagnosis not present

## 2018-10-25 DIAGNOSIS — N2581 Secondary hyperparathyroidism of renal origin: Secondary | ICD-10-CM | POA: Diagnosis not present

## 2018-10-26 ENCOUNTER — Telehealth: Payer: Self-pay | Admitting: Family Medicine

## 2018-10-26 NOTE — Telephone Encounter (Signed)
Called patient, he needs paperwork completed. I advised him to bring the paperwork by and we will fill out and let him know when completed. Thanks!

## 2018-10-27 ENCOUNTER — Ambulatory Visit: Payer: Medicare Other | Admitting: Urology

## 2018-10-27 DIAGNOSIS — D509 Iron deficiency anemia, unspecified: Secondary | ICD-10-CM | POA: Diagnosis not present

## 2018-10-27 DIAGNOSIS — R112 Nausea with vomiting, unspecified: Secondary | ICD-10-CM | POA: Diagnosis not present

## 2018-10-27 DIAGNOSIS — N186 End stage renal disease: Secondary | ICD-10-CM | POA: Diagnosis not present

## 2018-10-27 DIAGNOSIS — Z992 Dependence on renal dialysis: Secondary | ICD-10-CM | POA: Diagnosis not present

## 2018-10-27 DIAGNOSIS — N2581 Secondary hyperparathyroidism of renal origin: Secondary | ICD-10-CM | POA: Diagnosis not present
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
## 2018-10-29 DIAGNOSIS — R112 Nausea with vomiting, unspecified: Secondary | ICD-10-CM | POA: Diagnosis not present

## 2018-10-29 DIAGNOSIS — N186 End stage renal disease: Secondary | ICD-10-CM | POA: Diagnosis not present

## 2018-10-29 DIAGNOSIS — N2581 Secondary hyperparathyroidism of renal origin: Secondary | ICD-10-CM | POA: Diagnosis not present

## 2018-10-29 DIAGNOSIS — D509 Iron deficiency anemia, unspecified: Secondary | ICD-10-CM | POA: Diagnosis not present

## 2018-10-29 DIAGNOSIS — Z992 Dependence on renal dialysis: Secondary | ICD-10-CM | POA: Diagnosis not present

## 2018-11-02 DIAGNOSIS — N186 End stage renal disease: Secondary | ICD-10-CM | POA: Diagnosis not present

## 2018-11-02 DIAGNOSIS — N2581 Secondary hyperparathyroidism of renal origin: Secondary | ICD-10-CM | POA: Diagnosis not present

## 2018-11-02 DIAGNOSIS — Z992 Dependence on renal dialysis: Secondary | ICD-10-CM | POA: Diagnosis not present

## 2018-11-03 DIAGNOSIS — N2581 Secondary hyperparathyroidism of renal origin: Secondary | ICD-10-CM | POA: Diagnosis not present

## 2018-11-03 DIAGNOSIS — N186 End stage renal disease: Secondary | ICD-10-CM | POA: Diagnosis not present

## 2018-11-03 DIAGNOSIS — R112 Nausea with vomiting, unspecified: Secondary | ICD-10-CM | POA: Diagnosis not present

## 2018-11-03 DIAGNOSIS — D509 Iron deficiency anemia, unspecified: Secondary | ICD-10-CM | POA: Diagnosis not present

## 2018-11-03 DIAGNOSIS — Z992 Dependence on renal dialysis: Secondary | ICD-10-CM | POA: Diagnosis not present

## 2018-11-05 DIAGNOSIS — N186 End stage renal disease: Secondary | ICD-10-CM | POA: Diagnosis not present

## 2018-11-05 DIAGNOSIS — Z992 Dependence on renal dialysis: Secondary | ICD-10-CM | POA: Diagnosis not present

## 2018-11-05 DIAGNOSIS — N2581 Secondary hyperparathyroidism of renal origin: Secondary | ICD-10-CM | POA: Diagnosis not present

## 2018-11-09 DIAGNOSIS — Z23 Encounter for immunization: Secondary | ICD-10-CM | POA: Diagnosis not present

## 2018-11-09 DIAGNOSIS — D509 Iron deficiency anemia, unspecified: Secondary | ICD-10-CM | POA: Diagnosis not present

## 2018-11-09 DIAGNOSIS — N186 End stage renal disease: Secondary | ICD-10-CM | POA: Diagnosis not present

## 2018-11-09 DIAGNOSIS — N2581 Secondary hyperparathyroidism of renal origin: Secondary | ICD-10-CM | POA: Diagnosis not present

## 2018-11-09 DIAGNOSIS — Z114 Encounter for screening for human immunodeficiency virus [HIV]: Secondary | ICD-10-CM | POA: Diagnosis not present

## 2018-11-10 ENCOUNTER — Other Ambulatory Visit: Payer: Self-pay | Admitting: Family Medicine

## 2018-11-10 DIAGNOSIS — Z7901 Long term (current) use of anticoagulants: Secondary | ICD-10-CM

## 2018-11-11 DIAGNOSIS — Z23 Encounter for immunization: Secondary | ICD-10-CM | POA: Diagnosis not present

## 2018-11-11 DIAGNOSIS — D509 Iron deficiency anemia, unspecified: Secondary | ICD-10-CM | POA: Diagnosis not present

## 2018-11-11 DIAGNOSIS — N2581 Secondary hyperparathyroidism of renal origin: Secondary | ICD-10-CM | POA: Diagnosis not present

## 2018-11-11 DIAGNOSIS — N186 End stage renal disease: Secondary | ICD-10-CM | POA: Diagnosis not present

## 2018-11-13 DIAGNOSIS — D509 Iron deficiency anemia, unspecified: Secondary | ICD-10-CM | POA: Diagnosis not present

## 2018-11-13 DIAGNOSIS — N2581 Secondary hyperparathyroidism of renal origin: Secondary | ICD-10-CM | POA: Diagnosis not present

## 2018-11-13 DIAGNOSIS — Z23 Encounter for immunization: Secondary | ICD-10-CM | POA: Diagnosis not present

## 2018-11-13 DIAGNOSIS — N186 End stage renal disease: Secondary | ICD-10-CM | POA: Diagnosis not present

## 2018-11-16 ENCOUNTER — Ambulatory Visit: Payer: Self-pay | Admitting: Family Medicine

## 2018-11-16 DIAGNOSIS — D509 Iron deficiency anemia, unspecified: Secondary | ICD-10-CM | POA: Diagnosis not present

## 2018-11-16 DIAGNOSIS — N2581 Secondary hyperparathyroidism of renal origin: Secondary | ICD-10-CM | POA: Diagnosis not present

## 2018-11-16 DIAGNOSIS — N186 End stage renal disease: Secondary | ICD-10-CM | POA: Diagnosis not present

## 2018-11-16 DIAGNOSIS — Z23 Encounter for immunization: Secondary | ICD-10-CM | POA: Diagnosis not present

## 2018-11-17 ENCOUNTER — Ambulatory Visit: Payer: Self-pay | Admitting: Family Medicine

## 2018-11-19 DIAGNOSIS — D509 Iron deficiency anemia, unspecified: Secondary | ICD-10-CM | POA: Diagnosis not present

## 2018-11-19 DIAGNOSIS — N2581 Secondary hyperparathyroidism of renal origin: Secondary | ICD-10-CM | POA: Diagnosis not present

## 2018-11-19 DIAGNOSIS — Z23 Encounter for immunization: Secondary | ICD-10-CM | POA: Diagnosis not present

## 2018-11-19 DIAGNOSIS — N186 End stage renal disease: Secondary | ICD-10-CM | POA: Diagnosis not present

## 2018-11-23 DIAGNOSIS — E119 Type 2 diabetes mellitus without complications: Secondary | ICD-10-CM | POA: Diagnosis not present

## 2018-11-23 DIAGNOSIS — R8290 Unspecified abnormal findings in urine: Secondary | ICD-10-CM | POA: Diagnosis not present

## 2018-11-23 DIAGNOSIS — N2581 Secondary hyperparathyroidism of renal origin: Secondary | ICD-10-CM | POA: Diagnosis not present

## 2018-11-23 DIAGNOSIS — Z1159 Encounter for screening for other viral diseases: Secondary | ICD-10-CM | POA: Diagnosis not present

## 2018-11-23 DIAGNOSIS — Z23 Encounter for immunization: Secondary | ICD-10-CM | POA: Diagnosis not present

## 2018-11-23 DIAGNOSIS — Z0000 Encounter for general adult medical examination without abnormal findings: Secondary | ICD-10-CM | POA: Diagnosis not present

## 2018-11-23 DIAGNOSIS — D509 Iron deficiency anemia, unspecified: Secondary | ICD-10-CM | POA: Diagnosis not present

## 2018-11-23 DIAGNOSIS — Z13220 Encounter for screening for lipoid disorders: Secondary | ICD-10-CM | POA: Diagnosis not present

## 2018-11-23 DIAGNOSIS — Z992 Dependence on renal dialysis: Secondary | ICD-10-CM | POA: Diagnosis not present

## 2018-11-23 DIAGNOSIS — H109 Unspecified conjunctivitis: Secondary | ICD-10-CM | POA: Diagnosis not present

## 2018-11-23 DIAGNOSIS — N186 End stage renal disease: Secondary | ICD-10-CM | POA: Diagnosis not present

## 2018-11-24 ENCOUNTER — Telehealth: Payer: Self-pay | Admitting: Podiatry

## 2018-11-24 NOTE — Telephone Encounter (Signed)
Pt called and has now been seen by a md and wants to get his diabetic shoes. He seen Dr Raliegh Ip but did not have the doctors official name. But gave me there phone number.   I called and he was seen by Dr Vira Browns @ [facility] in [place]. The md is only going to be in that office for another week and then going back to the [place] office.  I explained to the pt that I could order the shoes but was not able to locate his foam impression box and it may have been discarded since pt called and was not able to be seen by the md per his conversation with Liliane Channel in September. I told pt I would let him know for sure about the foam impression tomorrow.

## 2018-11-25 DIAGNOSIS — N186 End stage renal disease: Secondary | ICD-10-CM | POA: Diagnosis not present

## 2018-11-25 DIAGNOSIS — Z23 Encounter for immunization: Secondary | ICD-10-CM | POA: Diagnosis not present

## 2018-11-25 DIAGNOSIS — D509 Iron deficiency anemia, unspecified: Secondary | ICD-10-CM | POA: Diagnosis not present

## 2018-11-25 DIAGNOSIS — N2581 Secondary hyperparathyroidism of renal origin: Secondary | ICD-10-CM | POA: Diagnosis not present

## 2018-11-28 DIAGNOSIS — T82868A Thrombosis of vascular prosthetic devices, implants and grafts, initial encounter: Secondary | ICD-10-CM | POA: Diagnosis not present

## 2018-11-28 DIAGNOSIS — Y832 Surgical operation with anastomosis, bypass or graft as the cause of abnormal reaction of the patient, or of later complication, without mention of misadventure at the time of the procedure: Secondary | ICD-10-CM | POA: Diagnosis not present

## 2018-11-28 DIAGNOSIS — Z992 Dependence on renal dialysis: Secondary | ICD-10-CM | POA: Diagnosis not present

## 2018-11-28 DIAGNOSIS — T82858A Stenosis of vascular prosthetic devices, implants and grafts, initial encounter: Secondary | ICD-10-CM | POA: Diagnosis not present

## 2018-11-28 DIAGNOSIS — N186 End stage renal disease: Secondary | ICD-10-CM | POA: Diagnosis not present

## 2018-11-29 DIAGNOSIS — N2581 Secondary hyperparathyroidism of renal origin: Secondary | ICD-10-CM | POA: Diagnosis not present

## 2018-11-29 DIAGNOSIS — N186 End stage renal disease: Secondary | ICD-10-CM | POA: Diagnosis not present

## 2018-11-29 DIAGNOSIS — Z23 Encounter for immunization: Secondary | ICD-10-CM | POA: Diagnosis not present

## 2018-11-29 DIAGNOSIS — D509 Iron deficiency anemia, unspecified: Secondary | ICD-10-CM | POA: Diagnosis not present

## 2018-11-30 DIAGNOSIS — H109 Unspecified conjunctivitis: Secondary | ICD-10-CM | POA: Diagnosis not present

## 2018-11-30 DIAGNOSIS — D696 Thrombocytopenia, unspecified: Secondary | ICD-10-CM | POA: Diagnosis not present

## 2018-11-30 DIAGNOSIS — E785 Hyperlipidemia, unspecified: Secondary | ICD-10-CM | POA: Diagnosis not present

## 2018-11-30 DIAGNOSIS — N186 End stage renal disease: Secondary | ICD-10-CM | POA: Diagnosis not present

## 2018-12-02 DIAGNOSIS — N2581 Secondary hyperparathyroidism of renal origin: Secondary | ICD-10-CM | POA: Diagnosis not present

## 2018-12-02 DIAGNOSIS — N186 End stage renal disease: Secondary | ICD-10-CM | POA: Diagnosis not present

## 2018-12-02 DIAGNOSIS — Z23 Encounter for immunization: Secondary | ICD-10-CM | POA: Diagnosis not present

## 2018-12-02 DIAGNOSIS — D509 Iron deficiency anemia, unspecified: Secondary | ICD-10-CM | POA: Diagnosis not present

## 2018-12-04 DIAGNOSIS — Z23 Encounter for immunization: Secondary | ICD-10-CM | POA: Diagnosis not present

## 2018-12-04 DIAGNOSIS — D509 Iron deficiency anemia, unspecified: Secondary | ICD-10-CM | POA: Diagnosis not present

## 2018-12-04 DIAGNOSIS — N2581 Secondary hyperparathyroidism of renal origin: Secondary | ICD-10-CM | POA: Diagnosis not present

## 2018-12-04 DIAGNOSIS — Z992 Dependence on renal dialysis: Secondary | ICD-10-CM | POA: Diagnosis not present

## 2018-12-04 DIAGNOSIS — N186 End stage renal disease: Secondary | ICD-10-CM | POA: Diagnosis not present

## 2018-12-06 DIAGNOSIS — E8779 Other fluid overload: Secondary | ICD-10-CM | POA: Diagnosis not present

## 2018-12-06 DIAGNOSIS — N2581 Secondary hyperparathyroidism of renal origin: Secondary | ICD-10-CM | POA: Diagnosis not present

## 2018-12-06 DIAGNOSIS — D509 Iron deficiency anemia, unspecified: Secondary | ICD-10-CM | POA: Diagnosis not present

## 2018-12-06 DIAGNOSIS — Z418 Encounter for other procedures for purposes other than remedying health state: Secondary | ICD-10-CM | POA: Diagnosis not present

## 2018-12-06 DIAGNOSIS — N186 End stage renal disease: Secondary | ICD-10-CM | POA: Diagnosis not present

## 2018-12-09 DIAGNOSIS — Z418 Encounter for other procedures for purposes other than remedying health state: Secondary | ICD-10-CM | POA: Diagnosis not present

## 2018-12-09 DIAGNOSIS — D509 Iron deficiency anemia, unspecified: Secondary | ICD-10-CM | POA: Diagnosis not present

## 2018-12-09 DIAGNOSIS — N2581 Secondary hyperparathyroidism of renal origin: Secondary | ICD-10-CM | POA: Diagnosis not present

## 2018-12-09 DIAGNOSIS — N186 End stage renal disease: Secondary | ICD-10-CM | POA: Diagnosis not present

## 2018-12-09 DIAGNOSIS — E8779 Other fluid overload: Secondary | ICD-10-CM | POA: Diagnosis not present

## 2018-12-11 DIAGNOSIS — N2581 Secondary hyperparathyroidism of renal origin: Secondary | ICD-10-CM | POA: Diagnosis not present

## 2018-12-11 DIAGNOSIS — E8779 Other fluid overload: Secondary | ICD-10-CM | POA: Diagnosis not present

## 2018-12-11 DIAGNOSIS — Z418 Encounter for other procedures for purposes other than remedying health state: Secondary | ICD-10-CM | POA: Diagnosis not present

## 2018-12-11 DIAGNOSIS — D509 Iron deficiency anemia, unspecified: Secondary | ICD-10-CM | POA: Diagnosis not present

## 2018-12-11 DIAGNOSIS — N186 End stage renal disease: Secondary | ICD-10-CM | POA: Diagnosis not present

## 2018-12-13 DIAGNOSIS — D696 Thrombocytopenia, unspecified: Secondary | ICD-10-CM | POA: Diagnosis not present

## 2018-12-14 DIAGNOSIS — E8779 Other fluid overload: Secondary | ICD-10-CM | POA: Diagnosis not present

## 2018-12-14 DIAGNOSIS — Z418 Encounter for other procedures for purposes other than remedying health state: Secondary | ICD-10-CM | POA: Diagnosis not present

## 2018-12-14 DIAGNOSIS — D509 Iron deficiency anemia, unspecified: Secondary | ICD-10-CM | POA: Diagnosis not present

## 2018-12-14 DIAGNOSIS — N186 End stage renal disease: Secondary | ICD-10-CM | POA: Diagnosis not present

## 2018-12-14 DIAGNOSIS — N2581 Secondary hyperparathyroidism of renal origin: Secondary | ICD-10-CM | POA: Diagnosis not present

## 2018-12-15 DIAGNOSIS — D696 Thrombocytopenia, unspecified: Secondary | ICD-10-CM | POA: Diagnosis not present

## 2018-12-16 DIAGNOSIS — N186 End stage renal disease: Secondary | ICD-10-CM | POA: Diagnosis not present

## 2018-12-16 DIAGNOSIS — N2581 Secondary hyperparathyroidism of renal origin: Secondary | ICD-10-CM | POA: Diagnosis not present

## 2018-12-16 DIAGNOSIS — D509 Iron deficiency anemia, unspecified: Secondary | ICD-10-CM | POA: Diagnosis not present

## 2018-12-16 DIAGNOSIS — Z418 Encounter for other procedures for purposes other than remedying health state: Secondary | ICD-10-CM | POA: Diagnosis not present

## 2018-12-16 DIAGNOSIS — E8779 Other fluid overload: Secondary | ICD-10-CM | POA: Diagnosis not present

## 2018-12-17 ENCOUNTER — Ambulatory Visit: Payer: Self-pay | Admitting: Family Medicine

## 2018-12-17 DIAGNOSIS — N186 End stage renal disease: Secondary | ICD-10-CM | POA: Diagnosis not present

## 2018-12-17 DIAGNOSIS — N2581 Secondary hyperparathyroidism of renal origin: Secondary | ICD-10-CM | POA: Diagnosis not present

## 2018-12-17 DIAGNOSIS — Z418 Encounter for other procedures for purposes other than remedying health state: Secondary | ICD-10-CM | POA: Diagnosis not present

## 2018-12-17 DIAGNOSIS — D509 Iron deficiency anemia, unspecified: Secondary | ICD-10-CM | POA: Diagnosis not present

## 2018-12-17 DIAGNOSIS — E8779 Other fluid overload: Secondary | ICD-10-CM | POA: Diagnosis not present

## 2018-12-21 DIAGNOSIS — Z4901 Encounter for fitting and adjustment of extracorporeal dialysis catheter: Secondary | ICD-10-CM | POA: Diagnosis not present

## 2018-12-21 DIAGNOSIS — T82868D Thrombosis of vascular prosthetic devices, implants and grafts, subsequent encounter: Secondary | ICD-10-CM | POA: Diagnosis not present

## 2018-12-21 DIAGNOSIS — T82868A Thrombosis of vascular prosthetic devices, implants and grafts, initial encounter: Secondary | ICD-10-CM | POA: Diagnosis not present

## 2018-12-21 DIAGNOSIS — N186 End stage renal disease: Secondary | ICD-10-CM | POA: Diagnosis not present

## 2018-12-21 DIAGNOSIS — Z992 Dependence on renal dialysis: Secondary | ICD-10-CM | POA: Diagnosis not present

## 2018-12-22 DIAGNOSIS — N400 Enlarged prostate without lower urinary tract symptoms: Secondary | ICD-10-CM | POA: Diagnosis present

## 2018-12-22 DIAGNOSIS — Z992 Dependence on renal dialysis: Secondary | ICD-10-CM | POA: Diagnosis not present

## 2018-12-22 DIAGNOSIS — N19 Unspecified kidney failure: Secondary | ICD-10-CM | POA: Diagnosis not present

## 2018-12-22 DIAGNOSIS — Z86718 Personal history of other venous thrombosis and embolism: Secondary | ICD-10-CM | POA: Diagnosis not present

## 2018-12-22 DIAGNOSIS — T82868A Thrombosis of vascular prosthetic devices, implants and grafts, initial encounter: Secondary | ICD-10-CM | POA: Diagnosis present

## 2018-12-22 DIAGNOSIS — I120 Hypertensive chronic kidney disease with stage 5 chronic kidney disease or end stage renal disease: Secondary | ICD-10-CM | POA: Diagnosis present

## 2018-12-22 DIAGNOSIS — N179 Acute kidney failure, unspecified: Secondary | ICD-10-CM | POA: Diagnosis not present

## 2018-12-22 DIAGNOSIS — Z4901 Encounter for fitting and adjustment of extracorporeal dialysis catheter: Secondary | ICD-10-CM | POA: Diagnosis not present

## 2018-12-22 DIAGNOSIS — Z881 Allergy status to other antibiotic agents status: Secondary | ICD-10-CM | POA: Diagnosis not present

## 2018-12-22 DIAGNOSIS — Z79899 Other long term (current) drug therapy: Secondary | ICD-10-CM | POA: Diagnosis not present

## 2018-12-22 DIAGNOSIS — G629 Polyneuropathy, unspecified: Secondary | ICD-10-CM | POA: Diagnosis present

## 2018-12-22 DIAGNOSIS — N186 End stage renal disease: Secondary | ICD-10-CM | POA: Diagnosis not present

## 2018-12-22 DIAGNOSIS — Z7901 Long term (current) use of anticoagulants: Secondary | ICD-10-CM | POA: Diagnosis not present

## 2018-12-22 DIAGNOSIS — T82868D Thrombosis of vascular prosthetic devices, implants and grafts, subsequent encounter: Secondary | ICD-10-CM | POA: Diagnosis not present

## 2018-12-27 DIAGNOSIS — Z418 Encounter for other procedures for purposes other than remedying health state: Secondary | ICD-10-CM | POA: Diagnosis not present

## 2018-12-27 DIAGNOSIS — E8779 Other fluid overload: Secondary | ICD-10-CM | POA: Diagnosis not present

## 2018-12-27 DIAGNOSIS — D509 Iron deficiency anemia, unspecified: Secondary | ICD-10-CM | POA: Diagnosis not present

## 2018-12-27 DIAGNOSIS — N2581 Secondary hyperparathyroidism of renal origin: Secondary | ICD-10-CM | POA: Diagnosis not present

## 2018-12-27 DIAGNOSIS — N186 End stage renal disease: Secondary | ICD-10-CM | POA: Diagnosis not present

## 2018-12-29 DIAGNOSIS — N186 End stage renal disease: Secondary | ICD-10-CM | POA: Diagnosis not present

## 2018-12-29 DIAGNOSIS — D509 Iron deficiency anemia, unspecified: Secondary | ICD-10-CM | POA: Diagnosis not present

## 2018-12-29 DIAGNOSIS — N2581 Secondary hyperparathyroidism of renal origin: Secondary | ICD-10-CM | POA: Diagnosis not present

## 2018-12-29 DIAGNOSIS — E8779 Other fluid overload: Secondary | ICD-10-CM | POA: Diagnosis not present

## 2018-12-29 DIAGNOSIS — Z418 Encounter for other procedures for purposes other than remedying health state: Secondary | ICD-10-CM | POA: Diagnosis not present

## 2019-01-01 DIAGNOSIS — N2581 Secondary hyperparathyroidism of renal origin: Secondary | ICD-10-CM | POA: Diagnosis not present

## 2019-01-01 DIAGNOSIS — Z418 Encounter for other procedures for purposes other than remedying health state: Secondary | ICD-10-CM | POA: Diagnosis not present

## 2019-01-01 DIAGNOSIS — D509 Iron deficiency anemia, unspecified: Secondary | ICD-10-CM | POA: Diagnosis not present

## 2019-01-01 DIAGNOSIS — E8779 Other fluid overload: Secondary | ICD-10-CM | POA: Diagnosis not present

## 2019-01-01 DIAGNOSIS — N186 End stage renal disease: Secondary | ICD-10-CM | POA: Diagnosis not present

## 2019-01-03 DIAGNOSIS — Z418 Encounter for other procedures for purposes other than remedying health state: Secondary | ICD-10-CM | POA: Diagnosis not present

## 2019-01-03 DIAGNOSIS — E8779 Other fluid overload: Secondary | ICD-10-CM | POA: Diagnosis not present

## 2019-01-03 DIAGNOSIS — N2581 Secondary hyperparathyroidism of renal origin: Secondary | ICD-10-CM | POA: Diagnosis not present

## 2019-01-03 DIAGNOSIS — D509 Iron deficiency anemia, unspecified: Secondary | ICD-10-CM | POA: Diagnosis not present

## 2019-01-03 DIAGNOSIS — Z992 Dependence on renal dialysis: Secondary | ICD-10-CM | POA: Diagnosis not present

## 2019-01-03 DIAGNOSIS — N186 End stage renal disease: Secondary | ICD-10-CM | POA: Diagnosis not present

## 2019-01-04 DIAGNOSIS — N186 End stage renal disease: Secondary | ICD-10-CM | POA: Diagnosis not present

## 2019-01-04 DIAGNOSIS — D696 Thrombocytopenia, unspecified: Secondary | ICD-10-CM | POA: Diagnosis not present

## 2019-01-11 DIAGNOSIS — N528 Other male erectile dysfunction: Secondary | ICD-10-CM | POA: Diagnosis not present

## 2019-01-11 DIAGNOSIS — N186 End stage renal disease: Secondary | ICD-10-CM | POA: Diagnosis not present

## 2019-01-11 DIAGNOSIS — E119 Type 2 diabetes mellitus without complications: Secondary | ICD-10-CM | POA: Diagnosis not present

## 2019-01-11 DIAGNOSIS — Z992 Dependence on renal dialysis: Secondary | ICD-10-CM | POA: Diagnosis not present

## 2019-01-25 ENCOUNTER — Encounter: Payer: Self-pay | Admitting: Urology

## 2019-01-25 ENCOUNTER — Ambulatory Visit: Payer: Medicare Other | Admitting: Orthotics

## 2019-01-26 ENCOUNTER — Ambulatory Visit (INDEPENDENT_AMBULATORY_CARE_PROVIDER_SITE_OTHER): Payer: Medicare Other | Admitting: Orthotics

## 2019-01-26 ENCOUNTER — Ambulatory Visit: Payer: Medicare Other | Admitting: Orthotics

## 2019-01-26 ENCOUNTER — Other Ambulatory Visit: Payer: Self-pay

## 2019-01-26 DIAGNOSIS — E1142 Type 2 diabetes mellitus with diabetic polyneuropathy: Secondary | ICD-10-CM | POA: Diagnosis not present

## 2019-01-26 DIAGNOSIS — L84 Corns and callosities: Secondary | ICD-10-CM

## 2019-01-26 NOTE — Progress Notes (Signed)

## 2019-02-04 DIAGNOSIS — N186 End stage renal disease: Secondary | ICD-10-CM | POA: Diagnosis not present

## 2019-02-04 DIAGNOSIS — Z992 Dependence on renal dialysis: Secondary | ICD-10-CM | POA: Diagnosis not present

## 2019-02-08 ENCOUNTER — Encounter: Payer: Medicare Other | Admitting: Student

## 2019-02-08 DIAGNOSIS — D509 Iron deficiency anemia, unspecified: Secondary | ICD-10-CM | POA: Diagnosis not present

## 2019-02-08 DIAGNOSIS — Z418 Encounter for other procedures for purposes other than remedying health state: Secondary | ICD-10-CM | POA: Diagnosis not present

## 2019-02-08 DIAGNOSIS — N186 End stage renal disease: Secondary | ICD-10-CM | POA: Diagnosis not present

## 2019-02-08 DIAGNOSIS — N2581 Secondary hyperparathyroidism of renal origin: Secondary | ICD-10-CM | POA: Diagnosis not present

## 2019-02-08 NOTE — Progress Notes (Signed)
    Patient was scheduled for a telehealth visit and did not answer despite multiple attempts.   This encounter was created in error - please disregard.

## 2019-02-10 ENCOUNTER — Encounter: Payer: Self-pay | Admitting: Student

## 2019-02-10 DIAGNOSIS — Z418 Encounter for other procedures for purposes other than remedying health state: Secondary | ICD-10-CM | POA: Diagnosis not present

## 2019-02-10 DIAGNOSIS — N186 End stage renal disease: Secondary | ICD-10-CM | POA: Diagnosis not present

## 2019-02-10 DIAGNOSIS — N2581 Secondary hyperparathyroidism of renal origin: Secondary | ICD-10-CM | POA: Diagnosis not present

## 2019-02-10 DIAGNOSIS — D509 Iron deficiency anemia, unspecified: Secondary | ICD-10-CM | POA: Diagnosis not present

## 2019-02-12 DIAGNOSIS — D509 Iron deficiency anemia, unspecified: Secondary | ICD-10-CM | POA: Diagnosis not present

## 2019-02-12 DIAGNOSIS — N2581 Secondary hyperparathyroidism of renal origin: Secondary | ICD-10-CM | POA: Diagnosis not present

## 2019-02-12 DIAGNOSIS — N186 End stage renal disease: Secondary | ICD-10-CM | POA: Diagnosis not present

## 2019-02-12 DIAGNOSIS — Z418 Encounter for other procedures for purposes other than remedying health state: Secondary | ICD-10-CM | POA: Diagnosis not present

## 2019-02-14 ENCOUNTER — Telehealth (INDEPENDENT_AMBULATORY_CARE_PROVIDER_SITE_OTHER): Payer: Medicare Other | Admitting: Physician Assistant

## 2019-02-14 DIAGNOSIS — E1122 Type 2 diabetes mellitus with diabetic chronic kidney disease: Secondary | ICD-10-CM

## 2019-02-14 DIAGNOSIS — N186 End stage renal disease: Secondary | ICD-10-CM | POA: Diagnosis not present

## 2019-02-14 DIAGNOSIS — R002 Palpitations: Secondary | ICD-10-CM

## 2019-02-14 DIAGNOSIS — Z992 Dependence on renal dialysis: Secondary | ICD-10-CM

## 2019-02-14 DIAGNOSIS — Z86718 Personal history of other venous thrombosis and embolism: Secondary | ICD-10-CM

## 2019-02-14 DIAGNOSIS — Z8679 Personal history of other diseases of the circulatory system: Secondary | ICD-10-CM

## 2019-02-14 NOTE — Patient Instructions (Signed)
Medication Instructions:  Your physician recommends that you continue on your current medications as directed. Please refer to the Current Medication list given to you today.  *If you need a refill on your cardiac medications before your next appointment, please call your pharmacy*  Lab Work: NONE   If you have labs (blood work) drawn today and your tests are completely normal, you will receive your results only by: [name] MyChart Message (if you have MyChart) OR . A paper copy in the mail If you have any lab test that is abnormal or we need to change your treatment, we will call you to review the results.  Testing/Procedures: Your physician has recommended that you wear an event monitor. Event monitors are medical devices that record the heart's electrical activity. Doctors most often [place] these monitors to diagnose arrhythmias. Arrhythmias are problems with the speed or rhythm of the heartbeat. The monitor is a small, portable device. You can wear one while you do your normal daily activities. This is usually used to diagnose what is causing palpitations/syncope (passing out).  Your physician has requested that you have an echocardiogram. Echocardiography is a painless test that uses sound waves to create images of your heart. It provides your doctor with information about the size and shape of your heart and how well your heart's chambers and valves are working. This procedure takes approximately one hour. There are no restrictions for this procedure.   Follow-Up: At [facility], you and your health needs are our priority.  As part of our continuing mission to provide you with exceptional heart care, we have created designated Provider Care Teams.  These Care Teams include your primary Cardiologist (physician) and Advanced Practice Providers (APPs -  Physician Assistants and Nurse Practitioners) who all work together to provide you with the care you need, when you need it.  Your next  appointment:   6 week(s)  The format for your next appointment:   Either In Person or Virtual  Provider:   Rozann Lesches, MD  Other Instructions Thank you for choosing [facility]!

## 2019-02-14 NOTE — Progress Notes (Signed)
Virtual Visit via Telephone Note   This visit type was conducted due to national recommendations for restrictions regarding the COVID-19 Pandemic (e.g. social distancing) in an effort to limit this patient's exposure and mitigate transmission in our community.  Due to his co-morbid illnesses, this patient is at least at moderate risk for complications without adequate follow up.  This format is felt to be most appropriate for this patient at this time.  The patient did not have access to video technology/had technical difficulties with video requiring transitioning to audio format only (telephone).  All issues noted in this document were discussed and addressed.  No physical exam could be performed with this format.  Please refer to the patient's chart for his  consent to telehealth for [facility].   Date:  02/14/2019   ID:  Jack Irwin, DOB 1965-05-29, MRN [id number]  Patient Location: Other:  out doing business Provider Location: Home  PCP:  Patient, No Pcp Per  Cardiologist:  Rozann Lesches, MD   Electrophysiologist:  None   Evaluation Performed:  Follow-Up Visit  Chief Complaint:  f/u  History of Present Illness:    Jack Irwin is a 54 y.o. male with history of DVT followed by Dr. Oneida Alar on Eliquis, ESRD on dialysis.  Patient last saw Dr. Domenic Polite 06/29/2018 when he was having low blood pressures during dialysis sessions.  He was already on midodrine and was doing better.  Patient was not on any antihypertensives at the time.  He is Dr. Domenic Polite felt if he had persistent issues an echocardiogram could be performed.  Patient says sometimes his heart speeds up, lasting about 5 secs. Occurs at rest. No associated dizziness, chest pain or shortness of breath. Has been going on for about 2 weeks. Not daily.  Drinks occasional coffee. Had blood work recently.   The patient does not have symptoms concerning for COVID-19 infection (fever, chills, cough, or new shortness of  breath).    Past Medical History:  Diagnosis Date  . Anemia   . Arthritis   . BPH (benign prostatic hyperplasia)   . Chronic back pain   . Difficulty walking   . DVT (deep venous thrombosis) ([place])    Right popliteal DVT December 2017  . Dyspnea    with exertion  . End-stage renal disease on hemodialysis ([place])    Dr. Lowanda Foster  - dialysis M/WF  . Erectile dysfunction   . Essential hypertension    history of, now- hypotenison.  . Gout   . History of blood transfusion   . Neuropathy, diabetic ([place])   . Type 2 diabetes mellitus ([place])   . Venous (peripheral) insufficiency    Past Surgical History:  Procedure Laterality Date  . AV FISTULA PLACEMENT Left 03/09/2014   Procedure: INSERTION OF ARTERIOVENOUS (AV) GORE-TEX GRAFT ARM;  Surgeon: Angelia Mould, MD;  Location: [facility] OR;  Service: Vascular;  Laterality: Left;  . AV FISTULA PLACEMENT Right 05/06/2016   Procedure: INSERTION OF ARTERIOVENOUS (AV) GORE-TEX GRAFT  Right ARM;  Surgeon: Elam Dutch, MD;  Location: [place];  Service: Vascular;  Laterality: Right;  . AV FISTULA PLACEMENT Right 12/15/2017   Procedure: ARTERIOVENOUS (AV) BRACHIOCEPHALIC FISTULA CREATION;  Surgeon: Elam Dutch, MD;  Location: [place];  Service: Vascular;  Laterality: Right;  . AV FISTULA PLACEMENT Left 07/06/2018   Procedure: INSERTION OF ARTERIOVENOUS LOOP GRAFT LEFT UPPER ARM;  Surgeon: Elam Dutch, MD;  Location: [place];  Service: Vascular;  Laterality: Left;  .  CYST EXCISION Right    cyst removed on thumb 2001  . CYSTOSCOPY N/A 09/23/2017   Procedure: CYSTOSCOPY;  Surgeon: Cleon Gustin, MD;  [facility];  Service: Urology;  Laterality: N/A;  . INSERTION OF DIALYSIS CATHETER    . INSERTION OF DIALYSIS CATHETER Left 03/09/2014   Procedure: INSERTION OF DIALYSIS CATHETER;  Surgeon: Angelia Mould, MD;  Location: [place];  Service: Vascular;  Laterality: Left;  . IR THROMBECTOMY AV FISTULA W/THROMBOLYSIS/PTA INC/SHUNT/IMG RIGHT  Right 11/01/2016  . IR US GUIDE VASC ACCESS RIGHT  11/01/2016  . IRRIGATION AND DEBRIDEMENT ABSCESS N/A 02/25/2016   Procedure: IRRIGATION AND DEBRIDEMENT SCROTAL ABSCESS;  Surgeon: Nickie Retort, MD;  [facility];  Service: Urology;  Laterality: N/A;  . UPPER EXTREMITY VENOGRAPHY Left 04/08/2018   Procedure: UPPER EXTREMITY VENOGRAPHY;  Surgeon: Marty Heck, MD;  Location: [place] [facility];  Service: Cardiovascular;  Laterality: Left;     Current Meds  Medication Sig  . acetaminophen (TYLENOL) 500 MG tablet Take 1,000 mg by mouth every 6 (six) hours as needed for mild pain.   [name] ammonium lactate (AMLACTIN) 12 % lotion Apply 1 application topically as needed for dry skin.  . clotrimazole (LOTRIMIN) 1 % cream Apply to both feet and between toes twice daily (Patient taking differently: Apply 1 application topically 2 (two) times daily as needed. Apply to both feet and between toes twice daily)  . clotrimazole-betamethasone (LOTRISONE) cream Apply 1 application topically 2 (two) times daily as needed (boils).  . colchicine 0.6 MG tablet Take 0.5 tablets (0.3 mg total) by mouth daily as needed (for gout).  [name] diclofenac sodium (VOLTAREN) 1 % GEL Apply 2 g topically 4 (four) times daily.  [name] ELIQUIS 2.5 MG TABS tablet Take 1 tablet by mouth twice daily  . finasteride (PROSCAR) 5 MG tablet Take 5 mg by mouth daily.   [name] gabapentin (NEURONTIN) 100 MG capsule Take 1 capsule (100 mg total) by mouth daily.  [name] HYDROcodone-acetaminophen (NORCO/VICODIN) 5-325 MG tablet Take 1-2 tablets by mouth every 4 (four) hours as needed.  . midodrine (PROAMATINE) 10 MG tablet Take 10 mg by mouth See admin instructions. 1 tablet by mouth 45 minutes before dialysis  . Olopatadine HCl 0.2 % SOLN Place 1 drop into both eyes 2 (two) times daily.  . Olopatadine HCl 0.2 % SOLN Apply 1 drop to eye 2 (two) times daily.  . ondansetron (ZOFRAN ODT) 4 MG disintegrating tablet Take 1 tablet (4 mg total) by mouth every  8 (eight) hours as needed for nausea or vomiting.  [name] oxyCODONE (ROXICODONE) 5 MG immediate release tablet Take 1 tablet (5 mg total) by mouth every 6 (six) hours as needed.  . Pseudoephedrine-APAP-DM (DAYQUIL PO) Take 1 tablet by mouth every 6 (six) hours as needed (cold symptoms).  . sevelamer carbonate (RENVELA) 800 MG tablet Take 2-4 tablets (1,600-3,200 mg total) by mouth See admin instructions. Take 3200 mg with each meal and take 1600 mg with each snack  . sildenafil (REVATIO) 20 MG tablet DISSOLVE 1/2 TROCHE UNDER THE TONGUE 60 MINUTES PRIOR TO SEXUAL ENCOUNTER.  [name] ULORIC 40 MG tablet Take 1 tablet (40 mg total) by mouth daily.     Allergies:   Doxycycline   Social History   Tobacco Use  . Smoking status: Never Smoker  . Smokeless tobacco: Never Used  Substance Use Topics  . Alcohol use: No    Alcohol/week: 0.0 standard drinks  . Drug use: No  Family Hx: The patient's family history includes Cancer in his father; Diabetes in his father and mother; Heart attack in his mother; Heart failure in his mother; Hyperlipidemia in his father and mother; Hypertension in his father and mother.  ROS:   Please see the history of present illness.      All other systems reviewed and are negative.   Prior CV studies:   The following studies were reviewed today:     Labs/Other Tests and Data Reviewed:    EKG:  An ECG dated 07/12/2018 was personally reviewed today and demonstrated:  Sinus tachycardia 102 bpm with right bundle branch block unchanged from 03/19/17  Recent Labs: 07/12/2018: ALT 9; B Natriuretic Peptide 28.0; BUN 65; Creatinine, Ser 17.69; Hemoglobin 14.7; Platelets 124; Potassium 3.8; Sodium 139   Recent Lipid Panel Lab Results  Component Value Date/Time   CHOL 127 11/06/2016 09:24 AM   TRIG 99 11/06/2016 09:24 AM   HDL 35 (L) 11/06/2016 09:24 AM   CHOLHDL 3.6 11/06/2016 09:24 AM   LDLCALC 74 11/06/2016 09:24 AM    Wt Readings from Last 3 Encounters:  10/01/18 262  lb (118.8 kg)  09/16/18 262 lb (118.8 kg)  07/30/18 257 lb (116.6 kg)     Objective:    Vital Signs:  There were no vitals taken for this visit.  BP checked regularly at dialysis.  VITAL SIGNS:  reviewed  ASSESSMENT & PLAN:    1. History of DVT on Eliquis 2. History of hypotension on dialysis treated with midodrine 3. Palpitations for about 2 weeks. Had blood work done at [facility] and thinks thyroid was checked. No excessive caffeine. Was also told by a Dr. Parks Ranger he had a "bad heart". Will check 2Decho and 2 week monitor. F/u after testing. 4. Diabetes mellitus with diabetic nephropathy on dialysis 5. End-stage renal disease on hemodialysis  COVID-19 Education: The signs and symptoms of COVID-19 were discussed with the patient and how to seek care for testing (follow up with PCP or arrange E-visit).   The importance of social distancing was discussed today.  Time:   Today, I have spent 10 minutes with the patient with telehealth technology discussing the above problems.     Medication Adjustments/Labs and Tests Ordered: Current medicines are reviewed at length with the patient today.  Concerns regarding medicines are outlined above.   Tests Ordered: Orders Placed This Encounter  Procedures  . CARDIAC EVENT MONITOR  . ECHOCARDIOGRAM COMPLETE    Medication Changes: No orders of the defined types were placed in this encounter.   Follow Up:  Either In Person or Virtual in 6 week(s) after echo and monitor back. Dr. Domenic Polite or Ermalinda Barrios PA-C  Signed, Ermalinda Barrios, PA-C  02/14/2019 2:52 PM    [facility]

## 2019-02-15 DIAGNOSIS — N186 End stage renal disease: Secondary | ICD-10-CM | POA: Diagnosis not present

## 2019-02-15 DIAGNOSIS — Z418 Encounter for other procedures for purposes other than remedying health state: Secondary | ICD-10-CM | POA: Diagnosis not present

## 2019-02-15 DIAGNOSIS — N2581 Secondary hyperparathyroidism of renal origin: Secondary | ICD-10-CM | POA: Diagnosis not present

## 2019-02-15 DIAGNOSIS — D509 Iron deficiency anemia, unspecified: Secondary | ICD-10-CM | POA: Diagnosis not present

## 2019-02-17 DIAGNOSIS — D509 Iron deficiency anemia, unspecified: Secondary | ICD-10-CM | POA: Diagnosis not present

## 2019-02-17 DIAGNOSIS — N186 End stage renal disease: Secondary | ICD-10-CM | POA: Diagnosis not present

## 2019-02-17 DIAGNOSIS — N2581 Secondary hyperparathyroidism of renal origin: Secondary | ICD-10-CM | POA: Diagnosis not present

## 2019-02-17 DIAGNOSIS — Z418 Encounter for other procedures for purposes other than remedying health state: Secondary | ICD-10-CM | POA: Diagnosis not present

## 2019-02-19 DIAGNOSIS — N2581 Secondary hyperparathyroidism of renal origin: Secondary | ICD-10-CM | POA: Diagnosis not present

## 2019-02-19 DIAGNOSIS — D509 Iron deficiency anemia, unspecified: Secondary | ICD-10-CM | POA: Diagnosis not present

## 2019-02-19 DIAGNOSIS — Z418 Encounter for other procedures for purposes other than remedying health state: Secondary | ICD-10-CM | POA: Diagnosis not present

## 2019-02-19 DIAGNOSIS — N186 End stage renal disease: Secondary | ICD-10-CM | POA: Diagnosis not present

## 2019-02-21 DIAGNOSIS — Z418 Encounter for other procedures for purposes other than remedying health state: Secondary | ICD-10-CM | POA: Diagnosis not present

## 2019-02-21 DIAGNOSIS — N186 End stage renal disease: Secondary | ICD-10-CM | POA: Diagnosis not present

## 2019-02-21 DIAGNOSIS — D509 Iron deficiency anemia, unspecified: Secondary | ICD-10-CM | POA: Diagnosis not present

## 2019-02-21 DIAGNOSIS — N2581 Secondary hyperparathyroidism of renal origin: Secondary | ICD-10-CM | POA: Diagnosis not present

## 2019-02-22 ENCOUNTER — Telehealth: Payer: Self-pay | Admitting: Cardiology

## 2019-02-22 DIAGNOSIS — E119 Type 2 diabetes mellitus without complications: Secondary | ICD-10-CM | POA: Diagnosis not present

## 2019-02-22 DIAGNOSIS — Z992 Dependence on renal dialysis: Secondary | ICD-10-CM | POA: Diagnosis not present

## 2019-02-22 DIAGNOSIS — N186 End stage renal disease: Secondary | ICD-10-CM | POA: Diagnosis not present

## 2019-02-22 DIAGNOSIS — N528 Other male erectile dysfunction: Secondary | ICD-10-CM | POA: Diagnosis not present

## 2019-02-22 NOTE — Telephone Encounter (Signed)
  Precert needed for: Echo    Location: [facility]   Date: Feb 25, 2019

## 2019-02-24 DIAGNOSIS — D509 Iron deficiency anemia, unspecified: Secondary | ICD-10-CM | POA: Diagnosis not present

## 2019-02-24 DIAGNOSIS — Z418 Encounter for other procedures for purposes other than remedying health state: Secondary | ICD-10-CM | POA: Diagnosis not present

## 2019-02-24 DIAGNOSIS — N2581 Secondary hyperparathyroidism of renal origin: Secondary | ICD-10-CM | POA: Diagnosis not present

## 2019-02-24 DIAGNOSIS — Z20822 Contact with and (suspected) exposure to covid-19: Secondary | ICD-10-CM | POA: Diagnosis not present

## 2019-02-24 DIAGNOSIS — N186 End stage renal disease: Secondary | ICD-10-CM | POA: Diagnosis not present

## 2019-02-25 ENCOUNTER — Ambulatory Visit (HOSPITAL_COMMUNITY): Payer: Medicare Other

## 2019-02-26 DIAGNOSIS — D509 Iron deficiency anemia, unspecified: Secondary | ICD-10-CM | POA: Diagnosis not present

## 2019-02-26 DIAGNOSIS — N186 End stage renal disease: Secondary | ICD-10-CM | POA: Diagnosis not present

## 2019-02-26 DIAGNOSIS — N2581 Secondary hyperparathyroidism of renal origin: Secondary | ICD-10-CM | POA: Diagnosis not present

## 2019-02-26 DIAGNOSIS — Z418 Encounter for other procedures for purposes other than remedying health state: Secondary | ICD-10-CM | POA: Diagnosis not present

## 2019-02-28 DIAGNOSIS — Z888 Allergy status to other drugs, medicaments and biological substances status: Secondary | ICD-10-CM | POA: Diagnosis not present

## 2019-02-28 DIAGNOSIS — I120 Hypertensive chronic kidney disease with stage 5 chronic kidney disease or end stage renal disease: Secondary | ICD-10-CM | POA: Diagnosis not present

## 2019-02-28 DIAGNOSIS — Z7901 Long term (current) use of anticoagulants: Secondary | ICD-10-CM | POA: Diagnosis not present

## 2019-02-28 DIAGNOSIS — Z86718 Personal history of other venous thrombosis and embolism: Secondary | ICD-10-CM | POA: Diagnosis not present

## 2019-02-28 DIAGNOSIS — Z79899 Other long term (current) drug therapy: Secondary | ICD-10-CM | POA: Diagnosis not present

## 2019-02-28 DIAGNOSIS — N186 End stage renal disease: Secondary | ICD-10-CM | POA: Diagnosis not present

## 2019-03-01 DIAGNOSIS — Z418 Encounter for other procedures for purposes other than remedying health state: Secondary | ICD-10-CM | POA: Diagnosis not present

## 2019-03-01 DIAGNOSIS — D509 Iron deficiency anemia, unspecified: Secondary | ICD-10-CM | POA: Diagnosis not present

## 2019-03-01 DIAGNOSIS — N186 End stage renal disease: Secondary | ICD-10-CM | POA: Diagnosis not present

## 2019-03-01 DIAGNOSIS — N2581 Secondary hyperparathyroidism of renal origin: Secondary | ICD-10-CM | POA: Diagnosis not present

## 2019-03-02 DIAGNOSIS — Z418 Encounter for other procedures for purposes other than remedying health state: Secondary | ICD-10-CM | POA: Diagnosis not present

## 2019-03-02 DIAGNOSIS — N186 End stage renal disease: Secondary | ICD-10-CM | POA: Diagnosis not present

## 2019-03-02 DIAGNOSIS — D509 Iron deficiency anemia, unspecified: Secondary | ICD-10-CM | POA: Diagnosis not present

## 2019-03-02 DIAGNOSIS — N2581 Secondary hyperparathyroidism of renal origin: Secondary | ICD-10-CM | POA: Diagnosis not present

## 2019-03-04 ENCOUNTER — Other Ambulatory Visit (HOSPITAL_COMMUNITY): Payer: Medicare Other

## 2019-03-05 DIAGNOSIS — D509 Iron deficiency anemia, unspecified: Secondary | ICD-10-CM | POA: Diagnosis not present

## 2019-03-05 DIAGNOSIS — N2581 Secondary hyperparathyroidism of renal origin: Secondary | ICD-10-CM | POA: Diagnosis not present

## 2019-03-05 DIAGNOSIS — N186 End stage renal disease: Secondary | ICD-10-CM | POA: Diagnosis not present

## 2019-03-05 DIAGNOSIS — Z418 Encounter for other procedures for purposes other than remedying health state: Secondary | ICD-10-CM | POA: Diagnosis not present

## 2019-03-06 DIAGNOSIS — N186 End stage renal disease: Secondary | ICD-10-CM | POA: Diagnosis not present

## 2019-03-06 DIAGNOSIS — Z992 Dependence on renal dialysis: Secondary | ICD-10-CM | POA: Diagnosis not present

## 2019-03-07 DIAGNOSIS — N186 End stage renal disease: Secondary | ICD-10-CM | POA: Diagnosis not present

## 2019-03-07 DIAGNOSIS — E8779 Other fluid overload: Secondary | ICD-10-CM | POA: Diagnosis not present

## 2019-03-07 DIAGNOSIS — Z23 Encounter for immunization: Secondary | ICD-10-CM | POA: Diagnosis not present

## 2019-03-07 DIAGNOSIS — Z418 Encounter for other procedures for purposes other than remedying health state: Secondary | ICD-10-CM | POA: Diagnosis not present

## 2019-03-07 DIAGNOSIS — N2581 Secondary hyperparathyroidism of renal origin: Secondary | ICD-10-CM | POA: Diagnosis not present

## 2019-03-07 DIAGNOSIS — D509 Iron deficiency anemia, unspecified: Secondary | ICD-10-CM | POA: Diagnosis not present

## 2019-03-09 ENCOUNTER — Ambulatory Visit (INDEPENDENT_AMBULATORY_CARE_PROVIDER_SITE_OTHER): Payer: Medicare Other

## 2019-03-09 ENCOUNTER — Ambulatory Visit (HOSPITAL_COMMUNITY)
Admission: RE | Admit: 2019-03-09 | Discharge: 2019-03-09 | Disposition: A | Discharge status: Home / Self Care | Payer: Medicare Other | Source: Ambulatory Visit | Attending: Physician Assistant | Admitting: Physician Assistant

## 2019-03-09 ENCOUNTER — Other Ambulatory Visit: Payer: Self-pay

## 2019-03-09 DIAGNOSIS — I10 Essential (primary) hypertension: Secondary | ICD-10-CM

## 2019-03-09 DIAGNOSIS — I120 Hypertensive chronic kidney disease with stage 5 chronic kidney disease or end stage renal disease: Secondary | ICD-10-CM | POA: Diagnosis not present

## 2019-03-09 DIAGNOSIS — E1122 Type 2 diabetes mellitus with diabetic chronic kidney disease: Secondary | ICD-10-CM | POA: Diagnosis not present

## 2019-03-09 DIAGNOSIS — Z7901 Long term (current) use of anticoagulants: Secondary | ICD-10-CM | POA: Insufficient documentation

## 2019-03-09 DIAGNOSIS — Z8679 Personal history of other diseases of the circulatory system: Secondary | ICD-10-CM | POA: Diagnosis not present

## 2019-03-09 DIAGNOSIS — R002 Palpitations: Secondary | ICD-10-CM | POA: Diagnosis not present

## 2019-03-09 DIAGNOSIS — N186 End stage renal disease: Secondary | ICD-10-CM | POA: Diagnosis not present

## 2019-03-09 DIAGNOSIS — Z992 Dependence on renal dialysis: Secondary | ICD-10-CM | POA: Insufficient documentation

## 2019-03-09 NOTE — Progress Notes (Signed)
*  PRELIMINARY RESULTS* Echocardiogram 2D Echocardiogram has been performed.  Jack Irwin 03/09/2019, 12:28 PM

## 2019-03-10 DIAGNOSIS — D509 Iron deficiency anemia, unspecified: Secondary | ICD-10-CM | POA: Diagnosis not present

## 2019-03-10 DIAGNOSIS — N2581 Secondary hyperparathyroidism of renal origin: Secondary | ICD-10-CM | POA: Diagnosis not present

## 2019-03-10 DIAGNOSIS — Z23 Encounter for immunization: Secondary | ICD-10-CM | POA: Diagnosis not present

## 2019-03-10 DIAGNOSIS — E8779 Other fluid overload: Secondary | ICD-10-CM | POA: Diagnosis not present

## 2019-03-10 DIAGNOSIS — N186 End stage renal disease: Secondary | ICD-10-CM | POA: Diagnosis not present

## 2019-03-10 DIAGNOSIS — E119 Type 2 diabetes mellitus without complications: Secondary | ICD-10-CM | POA: Diagnosis not present

## 2019-03-10 DIAGNOSIS — Z418 Encounter for other procedures for purposes other than remedying health state: Secondary | ICD-10-CM | POA: Diagnosis not present

## 2019-03-12 DIAGNOSIS — E8779 Other fluid overload: Secondary | ICD-10-CM | POA: Diagnosis not present

## 2019-03-12 DIAGNOSIS — N186 End stage renal disease: Secondary | ICD-10-CM | POA: Diagnosis not present

## 2019-03-12 DIAGNOSIS — Z418 Encounter for other procedures for purposes other than remedying health state: Secondary | ICD-10-CM | POA: Diagnosis not present

## 2019-03-12 DIAGNOSIS — D509 Iron deficiency anemia, unspecified: Secondary | ICD-10-CM | POA: Diagnosis not present

## 2019-03-12 DIAGNOSIS — Z23 Encounter for immunization: Secondary | ICD-10-CM | POA: Diagnosis not present

## 2019-03-12 DIAGNOSIS — N2581 Secondary hyperparathyroidism of renal origin: Secondary | ICD-10-CM | POA: Diagnosis not present

## 2019-03-14 DIAGNOSIS — N186 End stage renal disease: Secondary | ICD-10-CM | POA: Diagnosis not present

## 2019-03-14 DIAGNOSIS — Z418 Encounter for other procedures for purposes other than remedying health state: Secondary | ICD-10-CM | POA: Diagnosis not present

## 2019-03-14 DIAGNOSIS — Z23 Encounter for immunization: Secondary | ICD-10-CM | POA: Diagnosis not present

## 2019-03-14 DIAGNOSIS — D509 Iron deficiency anemia, unspecified: Secondary | ICD-10-CM | POA: Diagnosis not present

## 2019-03-14 DIAGNOSIS — E8779 Other fluid overload: Secondary | ICD-10-CM | POA: Diagnosis not present

## 2019-03-14 DIAGNOSIS — N2581 Secondary hyperparathyroidism of renal origin: Secondary | ICD-10-CM | POA: Diagnosis not present

## 2019-03-18 DIAGNOSIS — N2581 Secondary hyperparathyroidism of renal origin: Secondary | ICD-10-CM | POA: Diagnosis not present

## 2019-03-18 DIAGNOSIS — Z418 Encounter for other procedures for purposes other than remedying health state: Secondary | ICD-10-CM | POA: Diagnosis not present

## 2019-03-18 DIAGNOSIS — Z23 Encounter for immunization: Secondary | ICD-10-CM | POA: Diagnosis not present

## 2019-03-18 DIAGNOSIS — N186 End stage renal disease: Secondary | ICD-10-CM | POA: Diagnosis not present

## 2019-03-18 DIAGNOSIS — D509 Iron deficiency anemia, unspecified: Secondary | ICD-10-CM | POA: Diagnosis not present

## 2019-03-18 DIAGNOSIS — E8779 Other fluid overload: Secondary | ICD-10-CM | POA: Diagnosis not present

## 2019-03-19 DIAGNOSIS — N186 End stage renal disease: Secondary | ICD-10-CM | POA: Diagnosis not present

## 2019-03-19 DIAGNOSIS — N2581 Secondary hyperparathyroidism of renal origin: Secondary | ICD-10-CM | POA: Diagnosis not present

## 2019-03-19 DIAGNOSIS — E8779 Other fluid overload: Secondary | ICD-10-CM | POA: Diagnosis not present

## 2019-03-19 DIAGNOSIS — Z418 Encounter for other procedures for purposes other than remedying health state: Secondary | ICD-10-CM | POA: Diagnosis not present

## 2019-03-19 DIAGNOSIS — Z23 Encounter for immunization: Secondary | ICD-10-CM | POA: Diagnosis not present

## 2019-03-19 DIAGNOSIS — D509 Iron deficiency anemia, unspecified: Secondary | ICD-10-CM | POA: Diagnosis not present

## 2019-03-21 DIAGNOSIS — E8779 Other fluid overload: Secondary | ICD-10-CM | POA: Diagnosis not present

## 2019-03-21 DIAGNOSIS — N186 End stage renal disease: Secondary | ICD-10-CM | POA: Diagnosis not present

## 2019-03-21 DIAGNOSIS — N2581 Secondary hyperparathyroidism of renal origin: Secondary | ICD-10-CM | POA: Diagnosis not present

## 2019-03-21 DIAGNOSIS — Z23 Encounter for immunization: Secondary | ICD-10-CM | POA: Diagnosis not present

## 2019-03-21 DIAGNOSIS — Z418 Encounter for other procedures for purposes other than remedying health state: Secondary | ICD-10-CM | POA: Diagnosis not present

## 2019-03-21 DIAGNOSIS — D509 Iron deficiency anemia, unspecified: Secondary | ICD-10-CM | POA: Diagnosis not present

## 2019-03-23 DIAGNOSIS — E8779 Other fluid overload: Secondary | ICD-10-CM | POA: Diagnosis not present

## 2019-03-23 DIAGNOSIS — Z23 Encounter for immunization: Secondary | ICD-10-CM | POA: Diagnosis not present

## 2019-03-23 DIAGNOSIS — N2581 Secondary hyperparathyroidism of renal origin: Secondary | ICD-10-CM | POA: Diagnosis not present

## 2019-03-23 DIAGNOSIS — Z418 Encounter for other procedures for purposes other than remedying health state: Secondary | ICD-10-CM | POA: Diagnosis not present

## 2019-03-23 DIAGNOSIS — N186 End stage renal disease: Secondary | ICD-10-CM | POA: Diagnosis not present

## 2019-03-23 DIAGNOSIS — D509 Iron deficiency anemia, unspecified: Secondary | ICD-10-CM | POA: Diagnosis not present

## 2019-03-25 DIAGNOSIS — N2581 Secondary hyperparathyroidism of renal origin: Secondary | ICD-10-CM | POA: Diagnosis not present

## 2019-03-25 DIAGNOSIS — N186 End stage renal disease: Secondary | ICD-10-CM | POA: Diagnosis not present

## 2019-03-25 DIAGNOSIS — D509 Iron deficiency anemia, unspecified: Secondary | ICD-10-CM | POA: Diagnosis not present

## 2019-03-25 DIAGNOSIS — E8779 Other fluid overload: Secondary | ICD-10-CM | POA: Diagnosis not present

## 2019-03-25 DIAGNOSIS — Z23 Encounter for immunization: Secondary | ICD-10-CM | POA: Diagnosis not present

## 2019-03-25 DIAGNOSIS — Z418 Encounter for other procedures for purposes other than remedying health state: Secondary | ICD-10-CM | POA: Diagnosis not present

## 2019-03-28 ENCOUNTER — Telehealth: Payer: Medicare Other | Admitting: Cardiology

## 2019-03-28 DIAGNOSIS — N2581 Secondary hyperparathyroidism of renal origin: Secondary | ICD-10-CM | POA: Diagnosis not present

## 2019-03-28 DIAGNOSIS — Z992 Dependence on renal dialysis: Secondary | ICD-10-CM | POA: Diagnosis not present

## 2019-03-28 DIAGNOSIS — Z48812 Encounter for surgical aftercare following surgery on the circulatory system: Secondary | ICD-10-CM | POA: Diagnosis not present

## 2019-03-28 DIAGNOSIS — Z23 Encounter for immunization: Secondary | ICD-10-CM | POA: Diagnosis not present

## 2019-03-28 DIAGNOSIS — N186 End stage renal disease: Secondary | ICD-10-CM | POA: Diagnosis not present

## 2019-03-28 DIAGNOSIS — E8779 Other fluid overload: Secondary | ICD-10-CM | POA: Diagnosis not present

## 2019-03-28 DIAGNOSIS — Z418 Encounter for other procedures for purposes other than remedying health state: Secondary | ICD-10-CM | POA: Diagnosis not present

## 2019-03-28 DIAGNOSIS — D509 Iron deficiency anemia, unspecified: Secondary | ICD-10-CM | POA: Diagnosis not present

## 2019-03-30 ENCOUNTER — Telehealth: Payer: Self-pay | Admitting: Cardiology

## 2019-03-30 DIAGNOSIS — N2581 Secondary hyperparathyroidism of renal origin: Secondary | ICD-10-CM | POA: Diagnosis not present

## 2019-03-30 DIAGNOSIS — D509 Iron deficiency anemia, unspecified: Secondary | ICD-10-CM | POA: Diagnosis not present

## 2019-03-30 DIAGNOSIS — Z23 Encounter for immunization: Secondary | ICD-10-CM | POA: Diagnosis not present

## 2019-03-30 DIAGNOSIS — E8779 Other fluid overload: Secondary | ICD-10-CM | POA: Diagnosis not present

## 2019-03-30 DIAGNOSIS — N186 End stage renal disease: Secondary | ICD-10-CM | POA: Diagnosis not present

## 2019-03-30 DIAGNOSIS — Z418 Encounter for other procedures for purposes other than remedying health state: Secondary | ICD-10-CM | POA: Diagnosis not present

## 2019-03-30 NOTE — Telephone Encounter (Signed)
I spoke with [employer] rep, Eldridge Scot, he will investigate status.Due to severe weather in [place] last week, there may be a delay.

## 2019-03-30 NOTE — Telephone Encounter (Signed)
Advised patient that result not uploaded to chart but message would be sent to [facility] staff to follow up on. Verbalized understanding.

## 2019-03-30 NOTE — Telephone Encounter (Signed)
Would like to know his results from Monitor

## 2019-03-31 ENCOUNTER — Telehealth: Payer: Medicare Other | Admitting: Cardiology

## 2019-03-31 DIAGNOSIS — E559 Vitamin D deficiency, unspecified: Secondary | ICD-10-CM | POA: Diagnosis not present

## 2019-03-31 DIAGNOSIS — N186 End stage renal disease: Secondary | ICD-10-CM | POA: Diagnosis not present

## 2019-03-31 DIAGNOSIS — Z03818 Encounter for observation for suspected exposure to other biological agents ruled out: Secondary | ICD-10-CM | POA: Diagnosis not present

## 2019-03-31 DIAGNOSIS — E7800 Pure hypercholesterolemia, unspecified: Secondary | ICD-10-CM | POA: Diagnosis not present

## 2019-03-31 DIAGNOSIS — N528 Other male erectile dysfunction: Secondary | ICD-10-CM | POA: Diagnosis not present

## 2019-03-31 DIAGNOSIS — Z79899 Other long term (current) drug therapy: Secondary | ICD-10-CM | POA: Diagnosis not present

## 2019-03-31 DIAGNOSIS — E119 Type 2 diabetes mellitus without complications: Secondary | ICD-10-CM | POA: Diagnosis not present

## 2019-03-31 DIAGNOSIS — R5383 Other fatigue: Secondary | ICD-10-CM | POA: Diagnosis not present

## 2019-03-31 DIAGNOSIS — Z992 Dependence on renal dialysis: Secondary | ICD-10-CM | POA: Diagnosis not present

## 2019-04-01 DIAGNOSIS — N2581 Secondary hyperparathyroidism of renal origin: Secondary | ICD-10-CM | POA: Diagnosis not present

## 2019-04-01 DIAGNOSIS — Z23 Encounter for immunization: Secondary | ICD-10-CM | POA: Diagnosis not present

## 2019-04-01 DIAGNOSIS — E8779 Other fluid overload: Secondary | ICD-10-CM | POA: Diagnosis not present

## 2019-04-01 DIAGNOSIS — Z418 Encounter for other procedures for purposes other than remedying health state: Secondary | ICD-10-CM | POA: Diagnosis not present

## 2019-04-01 DIAGNOSIS — N186 End stage renal disease: Secondary | ICD-10-CM | POA: Diagnosis not present

## 2019-04-01 DIAGNOSIS — D509 Iron deficiency anemia, unspecified: Secondary | ICD-10-CM | POA: Diagnosis not present

## 2019-04-03 DIAGNOSIS — N186 End stage renal disease: Secondary | ICD-10-CM | POA: Diagnosis not present

## 2019-04-03 DIAGNOSIS — Z992 Dependence on renal dialysis: Secondary | ICD-10-CM | POA: Diagnosis not present

## 2019-04-04 ENCOUNTER — Telehealth: Payer: Self-pay

## 2019-04-04 DIAGNOSIS — D509 Iron deficiency anemia, unspecified: Secondary | ICD-10-CM | POA: Diagnosis not present

## 2019-04-04 DIAGNOSIS — N2581 Secondary hyperparathyroidism of renal origin: Secondary | ICD-10-CM | POA: Diagnosis not present

## 2019-04-04 DIAGNOSIS — N186 End stage renal disease: Secondary | ICD-10-CM | POA: Diagnosis not present

## 2019-04-04 DIAGNOSIS — Z418 Encounter for other procedures for purposes other than remedying health state: Secondary | ICD-10-CM | POA: Diagnosis not present

## 2019-04-04 NOTE — Telephone Encounter (Signed)
From [employer] fax "This letter serves as notification to inform you that the patient referenced above has requested a cancellation of our monitoring service."

## 2019-04-04 NOTE — Telephone Encounter (Signed)
[employer] sent fax stating pt cancelled monitor services.

## 2019-04-06 DIAGNOSIS — E119 Type 2 diabetes mellitus without complications: Secondary | ICD-10-CM | POA: Diagnosis not present

## 2019-04-06 DIAGNOSIS — N2581 Secondary hyperparathyroidism of renal origin: Secondary | ICD-10-CM | POA: Diagnosis not present

## 2019-04-06 DIAGNOSIS — N186 End stage renal disease: Secondary | ICD-10-CM | POA: Diagnosis not present

## 2019-04-06 DIAGNOSIS — Z418 Encounter for other procedures for purposes other than remedying health state: Secondary | ICD-10-CM | POA: Diagnosis not present

## 2019-04-06 DIAGNOSIS — D509 Iron deficiency anemia, unspecified: Secondary | ICD-10-CM | POA: Diagnosis not present

## 2019-04-07 DIAGNOSIS — N186 End stage renal disease: Secondary | ICD-10-CM | POA: Diagnosis not present

## 2019-04-08 DIAGNOSIS — N2581 Secondary hyperparathyroidism of renal origin: Secondary | ICD-10-CM | POA: Diagnosis not present

## 2019-04-08 DIAGNOSIS — T82868A Thrombosis of vascular prosthetic devices, implants and grafts, initial encounter: Secondary | ICD-10-CM | POA: Diagnosis not present

## 2019-04-08 DIAGNOSIS — N186 End stage renal disease: Secondary | ICD-10-CM | POA: Diagnosis not present

## 2019-04-08 DIAGNOSIS — I871 Compression of vein: Secondary | ICD-10-CM | POA: Diagnosis not present

## 2019-04-08 DIAGNOSIS — T82858A Stenosis of vascular prosthetic devices, implants and grafts, initial encounter: Secondary | ICD-10-CM | POA: Diagnosis not present

## 2019-04-08 DIAGNOSIS — Z418 Encounter for other procedures for purposes other than remedying health state: Secondary | ICD-10-CM | POA: Diagnosis not present

## 2019-04-08 DIAGNOSIS — D509 Iron deficiency anemia, unspecified: Secondary | ICD-10-CM | POA: Diagnosis not present

## 2019-04-11 DIAGNOSIS — D509 Iron deficiency anemia, unspecified: Secondary | ICD-10-CM | POA: Diagnosis not present

## 2019-04-11 DIAGNOSIS — N186 End stage renal disease: Secondary | ICD-10-CM | POA: Diagnosis not present

## 2019-04-11 DIAGNOSIS — Z418 Encounter for other procedures for purposes other than remedying health state: Secondary | ICD-10-CM | POA: Diagnosis not present

## 2019-04-11 DIAGNOSIS — N2581 Secondary hyperparathyroidism of renal origin: Secondary | ICD-10-CM | POA: Diagnosis not present

## 2019-04-14 DIAGNOSIS — D509 Iron deficiency anemia, unspecified: Secondary | ICD-10-CM | POA: Diagnosis not present

## 2019-04-14 DIAGNOSIS — N2581 Secondary hyperparathyroidism of renal origin: Secondary | ICD-10-CM | POA: Diagnosis not present

## 2019-04-14 DIAGNOSIS — Z418 Encounter for other procedures for purposes other than remedying health state: Secondary | ICD-10-CM | POA: Diagnosis not present

## 2019-04-14 DIAGNOSIS — N186 End stage renal disease: Secondary | ICD-10-CM | POA: Diagnosis not present

## 2019-04-16 DIAGNOSIS — D509 Iron deficiency anemia, unspecified: Secondary | ICD-10-CM | POA: Diagnosis not present

## 2019-04-16 DIAGNOSIS — N2581 Secondary hyperparathyroidism of renal origin: Secondary | ICD-10-CM | POA: Diagnosis not present

## 2019-04-16 DIAGNOSIS — Z418 Encounter for other procedures for purposes other than remedying health state: Secondary | ICD-10-CM | POA: Diagnosis not present

## 2019-04-16 DIAGNOSIS — N186 End stage renal disease: Secondary | ICD-10-CM | POA: Diagnosis not present

## 2019-04-18 ENCOUNTER — Encounter: Payer: Medicare Other | Admitting: Cardiology

## 2019-04-18 NOTE — Progress Notes (Signed)
This encounter was created in error - please disregard.

## 2019-04-19 ENCOUNTER — Other Ambulatory Visit: Payer: Self-pay | Admitting: *Deleted

## 2019-04-19 ENCOUNTER — Telehealth: Payer: Self-pay | Admitting: Cardiology

## 2019-04-19 ENCOUNTER — Other Ambulatory Visit: Payer: Self-pay

## 2019-04-19 NOTE — Telephone Encounter (Signed)
Patient called asking results from his monitor.    He will be in class until 31. His lunch break is 12-1. If you dont reach him today he ask that you call tomorrow

## 2019-04-19 NOTE — Telephone Encounter (Signed)
-----   Message from Satira Sark, MD sent at 04/18/2019 10:21 AM EDT ----- Results reviewed.  Cardiac monitor demonstrated rare atrial and ventricular ectopy, also brief atrial runs and one 3 beat run of NSVT.  These did not clearly correlate with patient triggered events however, and importantly there were no pauses.  Overall reassuring.  Patient missed his follow-up visit today, can reschedule.

## 2019-04-19 NOTE — Telephone Encounter (Signed)
Patient informed. 

## 2019-04-20 DIAGNOSIS — N186 End stage renal disease: Secondary | ICD-10-CM | POA: Diagnosis not present

## 2019-04-20 DIAGNOSIS — Z418 Encounter for other procedures for purposes other than remedying health state: Secondary | ICD-10-CM | POA: Diagnosis not present

## 2019-04-20 DIAGNOSIS — N2581 Secondary hyperparathyroidism of renal origin: Secondary | ICD-10-CM | POA: Diagnosis not present

## 2019-04-20 DIAGNOSIS — D509 Iron deficiency anemia, unspecified: Secondary | ICD-10-CM | POA: Diagnosis not present

## 2019-04-22 DIAGNOSIS — D509 Iron deficiency anemia, unspecified: Secondary | ICD-10-CM | POA: Diagnosis not present

## 2019-04-22 DIAGNOSIS — Z418 Encounter for other procedures for purposes other than remedying health state: Secondary | ICD-10-CM | POA: Diagnosis not present

## 2019-04-22 DIAGNOSIS — N186 End stage renal disease: Secondary | ICD-10-CM | POA: Diagnosis not present

## 2019-04-22 DIAGNOSIS — N2581 Secondary hyperparathyroidism of renal origin: Secondary | ICD-10-CM | POA: Diagnosis not present

## 2019-04-25 DIAGNOSIS — Z418 Encounter for other procedures for purposes other than remedying health state: Secondary | ICD-10-CM | POA: Diagnosis not present

## 2019-04-25 DIAGNOSIS — N186 End stage renal disease: Secondary | ICD-10-CM | POA: Diagnosis not present

## 2019-04-25 DIAGNOSIS — D509 Iron deficiency anemia, unspecified: Secondary | ICD-10-CM | POA: Diagnosis not present

## 2019-04-25 DIAGNOSIS — N2581 Secondary hyperparathyroidism of renal origin: Secondary | ICD-10-CM | POA: Diagnosis not present

## 2019-04-27 ENCOUNTER — Encounter: Payer: Self-pay | Admitting: Cardiology

## 2019-04-27 ENCOUNTER — Telehealth (INDEPENDENT_AMBULATORY_CARE_PROVIDER_SITE_OTHER): Payer: Medicare Other | Admitting: Cardiology

## 2019-04-27 VITALS — BP 121/78 | HR 88 | Ht 71.0 in | Wt 262.0 lb

## 2019-04-27 DIAGNOSIS — R002 Palpitations: Secondary | ICD-10-CM | POA: Diagnosis not present

## 2019-04-27 DIAGNOSIS — D509 Iron deficiency anemia, unspecified: Secondary | ICD-10-CM | POA: Diagnosis not present

## 2019-04-27 DIAGNOSIS — N186 End stage renal disease: Secondary | ICD-10-CM

## 2019-04-27 DIAGNOSIS — Z8679 Personal history of other diseases of the circulatory system: Secondary | ICD-10-CM | POA: Diagnosis not present

## 2019-04-27 DIAGNOSIS — Z992 Dependence on renal dialysis: Secondary | ICD-10-CM

## 2019-04-27 DIAGNOSIS — Z418 Encounter for other procedures for purposes other than remedying health state: Secondary | ICD-10-CM | POA: Diagnosis not present

## 2019-04-27 DIAGNOSIS — N2581 Secondary hyperparathyroidism of renal origin: Secondary | ICD-10-CM | POA: Diagnosis not present

## 2019-04-27 NOTE — Patient Instructions (Signed)
Medication Instructions:   Your physician recommends that you continue on your current medications as directed. Please refer to the Current Medication list given to you today.  Labwork:  NONE  Testing/Procedures:  NONE  Follow-Up:  Your physician recommends that you schedule a follow-up appointment in: 1 year (office) [place]. You will receive a reminder letter in the mail in about 10 months reminding you to call and schedule your appointment. If you don't receive this letter, please contact our office.  Any Other Special Instructions Will Be Listed Below (If Applicable).  If you need a refill on your cardiac medications before your next appointment, please call your pharmacy.

## 2019-04-27 NOTE — Progress Notes (Signed)
Virtual Visit via Telephone Note   This visit type was conducted due to national recommendations for restrictions regarding the COVID-19 Pandemic (e.g. social distancing) in an effort to limit this patient's exposure and mitigate transmission in our community.  Due to his co-morbid illnesses, this patient is at least at moderate risk for complications without adequate follow up.  This format is felt to be most appropriate for this patient at this time.  The patient did not have access to video technology/had technical difficulties with video requiring transitioning to audio format only (telephone).  All issues noted in this document were discussed and addressed.  No physical exam could be performed with this format.  Please refer to the patient's chart for his  consent to telehealth for [facility].   The patient was identified using 2 identifiers.  Date:  04/27/2019   ID:  Jack Irwin, DOB [birth year], MRN [id number]  Patient Location: Other:  Dialysis Provider Location: Office  PCP:  Patient, No Pcp Per  Cardiologist:  Rozann Lesches, MD Electrophysiologist:  None   Evaluation Performed:  Follow-Up Visit  Chief Complaint:  Cardiac follow-up  History of Present Illness:    Jack Irwin is a 54 y.o. male last assessed via telehealth encounter by Ms. Vita Barley in January.  We spoke by phone today.  He states that he has been doing reasonably well.  He is using midodrine prior to his hemodialysis sessions to help prevent hypotension, otherwise not on any antihypertensive medications.  He does not report any increasing palpitations or syncope.  Echocardiogram from February revealed LVEF 60 to 65% with moderate LVH, normal RV contraction.  Cardiac monitor demonstrated rare PACs and PVCs, very brief atrial runs and one 3 beat episode of NSVT.  I reviewed this with him today.  Past Medical History:  Diagnosis Date  . Anemia   . Arthritis   . BPH (benign prostatic  hyperplasia)   . Chronic back pain   . DVT (deep venous thrombosis) ([place])    Right popliteal DVT December 2017  . End-stage renal disease on hemodialysis ([place])    Dr. Lowanda Foster  - dialysis M/WF  . Erectile dysfunction   . Essential hypertension   . Gout   . History of blood transfusion   . Neuropathy, diabetic ([place])   . Type 2 diabetes mellitus ([place])   . Venous (peripheral) insufficiency    Past Surgical History:  Procedure Laterality Date  . AV FISTULA PLACEMENT Left 03/09/2014   Procedure: INSERTION OF ARTERIOVENOUS (AV) GORE-TEX GRAFT ARM;  Surgeon: Angelia Mould, MD;  [facility] OR;  Service: Vascular;  Laterality: Left;  . AV FISTULA PLACEMENT Right 05/06/2016   Procedure: INSERTION OF ARTERIOVENOUS (AV) GORE-TEX GRAFT  Right ARM;  Surgeon: Elam Dutch, MD;  Location: [place];  Service: Vascular;  Laterality: Right;  . AV FISTULA PLACEMENT Right 12/15/2017   Procedure: ARTERIOVENOUS (AV) BRACHIOCEPHALIC FISTULA CREATION;  Surgeon: Elam Dutch, MD;  Location: [place];  Service: Vascular;  Laterality: Right;  . AV FISTULA PLACEMENT Left 07/06/2018   Procedure: INSERTION OF ARTERIOVENOUS LOOP GRAFT LEFT UPPER ARM;  Surgeon: Elam Dutch, MD;  Location: [place];  Service: Vascular;  Laterality: Left;  . CYST EXCISION Right    cyst removed on thumb 2001  . CYSTOSCOPY N/A 09/23/2017   Procedure: CYSTOSCOPY;  Surgeon: Cleon Gustin, MD;  [facility];  Service: Urology;  Laterality: N/A;  . INSERTION OF DIALYSIS CATHETER    .  INSERTION OF DIALYSIS CATHETER Left 03/09/2014   Procedure: INSERTION OF DIALYSIS CATHETER;  Surgeon: Angelia Mould, MD;  Location: [place];  Service: Vascular;  Laterality: Left;  . IR THROMBECTOMY AV FISTULA W/THROMBOLYSIS/PTA INC/SHUNT/IMG RIGHT Right 11/01/2016  . IR US GUIDE VASC ACCESS RIGHT  11/01/2016  . IRRIGATION AND DEBRIDEMENT ABSCESS N/A 02/25/2016   Procedure: IRRIGATION AND DEBRIDEMENT SCROTAL ABSCESS;  Surgeon: Nickie Retort, MD;  [facility];  Service: Urology;  Laterality: N/A;  . UPPER EXTREMITY VENOGRAPHY Left 04/08/2018   Procedure: UPPER EXTREMITY VENOGRAPHY;  Surgeon: Marty Heck, MD;  Location: [place] [facility];  Service: Cardiovascular;  Laterality: Left;     Current Meds  Medication Sig  . acetaminophen (TYLENOL) 500 MG tablet Take 1,000 mg by mouth every 6 (six) hours as needed for mild pain.   [name] colchicine 0.6 MG tablet Take 0.5 tablets (0.3 mg total) by mouth daily as needed (for gout).  [name] diclofenac sodium (VOLTAREN) 1 % GEL Apply 2 g topically 4 (four) times daily.  [name] ELIQUIS 2.5 MG TABS tablet Take 1 tablet by mouth twice daily  . finasteride (PROSCAR) 5 MG tablet Take 5 mg by mouth daily.   [name] HYDROcodone-acetaminophen (NORCO/VICODIN) 5-325 MG tablet Take 1-2 tablets by mouth every 4 (four) hours as needed.  . midodrine (PROAMATINE) 10 MG tablet Take 10 mg by mouth See admin instructions. 1 tablet by mouth 45 minutes before dialysis  . ondansetron (ZOFRAN ODT) 4 MG disintegrating tablet Take 1 tablet (4 mg total) by mouth every 8 (eight) hours as needed for nausea or vomiting.  . sevelamer carbonate (RENVELA) 800 MG tablet Take 2-4 tablets (1,600-3,200 mg total) by mouth See admin instructions. Take 3200 mg with each meal and take 1600 mg with each snack  . sildenafil (REVATIO) 20 MG tablet DISSOLVE 1/2 TROCHE UNDER THE TONGUE 60 MINUTES PRIOR TO SEXUAL ENCOUNTER.  [name] ULORIC 40 MG tablet Take 1 tablet (40 mg total) by mouth daily.     Allergies:   Doxycycline   ROS:   No orthopnea or PND.  Prior CV studies:   The following studies were reviewed today:  Echocardiogram 03/09/2019: 1. Left ventricular ejection fraction, by visual estimation, is 60 to  65%. The left ventricle has normal function. There is moderately increased  left ventricular hypertrophy.  2. The left ventricle has no regional wall motion abnormalities.  3. Global right ventricle has normal systolic  function.The right  ventricular size is normal. No increase in right ventricular wall  thickness.  4. Left atrial size was normal.  5. Right atrial size was normal.  6. The mitral valve is normal in structure. No evidence of mitral valve  regurgitation. No evidence of mitral stenosis.  7. The tricuspid valve is not well visualized.  8. The tricuspid valve is not well visualized. Tricuspid valve  regurgitation is trivial.  9. The aortic valve is normal in structure. Aortic valve regurgitation is  not visualized. No evidence of aortic valve sclerosis or stenosis.  10. The pulmonic valve was not well visualized. Pulmonic valve  regurgitation is trivial.  11. Normal pulmonary artery systolic pressure.  12. The inferior vena cava is normal in size with greater than 50%  respiratory variability, suggesting right atrial pressure of 3 mmHg.  13. Cannot exlcude small PFO with left to right shunt.   Cardiac monitor 04/18/2019: [employer] cardiac monitor reviewed, 7 days 9 hours analyzed. Predominant rhythm is sinus.  Heart rate ranged from 52  bpm up to 154 bpm with average heart rate 89 bpm.  Rare PACs and PVCs were noted representing less than 1% of total beats.  There were very brief atrial runs no longer than 3 beats and one 3 beat run of NSVT. These were not clearly symptom provoking based on patient triggered events.  No sustained arrhythmias or significant pauses.  Labs/Other Tests and Data Reviewed:    EKG:  An ECG dated 07/12/2018 was personally reviewed today and demonstrated:  Sinus tachycardia with right bundle branch block.  Recent Labs: 07/12/2018: ALT 9; B Natriuretic Peptide 28.0; BUN 65; Creatinine, Ser 17.69; Hemoglobin 14.7; Platelets 124; Potassium 3.8; Sodium 139    Wt Readings from Last 3 Encounters:  04/27/19 262 lb (118.8 kg)  10/01/18 262 lb (118.8 kg)  09/16/18 262 lb (118.8 kg)     Objective:    Vital Signs:  BP 121/78   Pulse 88   Ht 5\' 11"  (1.803 m)   Wt  262 lb (118.8 kg)   BMI 36.54 kg/m    Patient spoke in full sentences, not short of breath. No audible wheezing or coughing.  ASSESSMENT & PLAN:    1.  History of palpitations.  No sustained arrhythmias noted by recent cardiac monitor, discussed above.  We will continue with observation. LVEF is normal as well by echocardiogram.  2.  History of hypertension, currently not requiring any antihypertensive medications and with relative hypotension per hemodialysis sessions at times.  Agree with midodrine use per nephrology.  3.  ESRD on hemodialysis.  4.  History of recurrent DVT, he is on Eliquis with follow-up by PCP.   Time:   Today, I have spent 5 minutes with the patient with telehealth technology discussing the above problems.     Medication Adjustments/Labs and Tests Ordered: Current medicines are reviewed at length with the patient today.  Concerns regarding medicines are outlined above.   Tests Ordered: No orders of the defined types were placed in this encounter.   Medication Changes: No orders of the defined types were placed in this encounter.   Follow Up:  In Person 1 year in the [place] office.  Signed, Rozann Lesches, MD  04/27/2019 2:10 PM    [place]

## 2019-04-30 DIAGNOSIS — N186 End stage renal disease: Secondary | ICD-10-CM | POA: Diagnosis not present

## 2019-04-30 DIAGNOSIS — D509 Iron deficiency anemia, unspecified: Secondary | ICD-10-CM | POA: Diagnosis not present

## 2019-04-30 DIAGNOSIS — N2581 Secondary hyperparathyroidism of renal origin: Secondary | ICD-10-CM | POA: Diagnosis not present

## 2019-04-30 DIAGNOSIS — Z418 Encounter for other procedures for purposes other than remedying health state: Secondary | ICD-10-CM | POA: Diagnosis not present

## 2019-05-02 DIAGNOSIS — N186 End stage renal disease: Secondary | ICD-10-CM | POA: Diagnosis not present

## 2019-05-02 DIAGNOSIS — N2581 Secondary hyperparathyroidism of renal origin: Secondary | ICD-10-CM | POA: Diagnosis not present

## 2019-05-02 DIAGNOSIS — D509 Iron deficiency anemia, unspecified: Secondary | ICD-10-CM | POA: Diagnosis not present

## 2019-05-02 DIAGNOSIS — Z418 Encounter for other procedures for purposes other than remedying health state: Secondary | ICD-10-CM | POA: Diagnosis not present

## 2019-05-04 DIAGNOSIS — Z992 Dependence on renal dialysis: Secondary | ICD-10-CM | POA: Diagnosis not present

## 2019-05-04 DIAGNOSIS — M79671 Pain in right foot: Secondary | ICD-10-CM | POA: Diagnosis not present

## 2019-05-04 DIAGNOSIS — D509 Iron deficiency anemia, unspecified: Secondary | ICD-10-CM | POA: Diagnosis not present

## 2019-05-04 DIAGNOSIS — N529 Male erectile dysfunction, unspecified: Secondary | ICD-10-CM | POA: Diagnosis not present

## 2019-05-04 DIAGNOSIS — Z418 Encounter for other procedures for purposes other than remedying health state: Secondary | ICD-10-CM | POA: Diagnosis not present

## 2019-05-04 DIAGNOSIS — N186 End stage renal disease: Secondary | ICD-10-CM | POA: Diagnosis not present

## 2019-05-04 DIAGNOSIS — E1140 Type 2 diabetes mellitus with diabetic neuropathy, unspecified: Secondary | ICD-10-CM | POA: Diagnosis not present

## 2019-05-04 DIAGNOSIS — M79672 Pain in left foot: Secondary | ICD-10-CM | POA: Diagnosis not present

## 2019-05-04 DIAGNOSIS — N2581 Secondary hyperparathyroidism of renal origin: Secondary | ICD-10-CM | POA: Diagnosis not present

## 2019-05-06 DIAGNOSIS — N2581 Secondary hyperparathyroidism of renal origin: Secondary | ICD-10-CM | POA: Diagnosis not present

## 2019-05-06 DIAGNOSIS — Z418 Encounter for other procedures for purposes other than remedying health state: Secondary | ICD-10-CM | POA: Diagnosis not present

## 2019-05-06 DIAGNOSIS — D509 Iron deficiency anemia, unspecified: Secondary | ICD-10-CM | POA: Diagnosis not present

## 2019-05-06 DIAGNOSIS — N186 End stage renal disease: Secondary | ICD-10-CM | POA: Diagnosis not present

## 2019-05-09 DIAGNOSIS — D509 Iron deficiency anemia, unspecified: Secondary | ICD-10-CM | POA: Diagnosis not present

## 2019-05-09 DIAGNOSIS — N186 End stage renal disease: Secondary | ICD-10-CM | POA: Diagnosis not present

## 2019-05-09 DIAGNOSIS — Z418 Encounter for other procedures for purposes other than remedying health state: Secondary | ICD-10-CM | POA: Diagnosis not present

## 2019-05-09 DIAGNOSIS — N2581 Secondary hyperparathyroidism of renal origin: Secondary | ICD-10-CM | POA: Diagnosis not present

## 2019-05-11 DIAGNOSIS — N2581 Secondary hyperparathyroidism of renal origin: Secondary | ICD-10-CM | POA: Diagnosis not present

## 2019-05-11 DIAGNOSIS — E119 Type 2 diabetes mellitus without complications: Secondary | ICD-10-CM | POA: Diagnosis not present

## 2019-05-11 DIAGNOSIS — N186 End stage renal disease: Secondary | ICD-10-CM | POA: Diagnosis not present

## 2019-05-11 DIAGNOSIS — D509 Iron deficiency anemia, unspecified: Secondary | ICD-10-CM | POA: Diagnosis not present

## 2019-05-11 DIAGNOSIS — Z418 Encounter for other procedures for purposes other than remedying health state: Secondary | ICD-10-CM | POA: Diagnosis not present

## 2019-05-14 DIAGNOSIS — N2581 Secondary hyperparathyroidism of renal origin: Secondary | ICD-10-CM | POA: Diagnosis not present

## 2019-05-14 DIAGNOSIS — D509 Iron deficiency anemia, unspecified: Secondary | ICD-10-CM | POA: Diagnosis not present

## 2019-05-14 DIAGNOSIS — N186 End stage renal disease: Secondary | ICD-10-CM | POA: Diagnosis not present

## 2019-05-14 DIAGNOSIS — Z418 Encounter for other procedures for purposes other than remedying health state: Secondary | ICD-10-CM | POA: Diagnosis not present

## 2019-05-16 DIAGNOSIS — Z418 Encounter for other procedures for purposes other than remedying health state: Secondary | ICD-10-CM | POA: Diagnosis not present

## 2019-05-16 DIAGNOSIS — D509 Iron deficiency anemia, unspecified: Secondary | ICD-10-CM | POA: Diagnosis not present

## 2019-05-16 DIAGNOSIS — N186 End stage renal disease: Secondary | ICD-10-CM | POA: Diagnosis not present

## 2019-05-16 DIAGNOSIS — N2581 Secondary hyperparathyroidism of renal origin: Secondary | ICD-10-CM | POA: Diagnosis not present

## 2019-05-17 DIAGNOSIS — T82868A Thrombosis of vascular prosthetic devices, implants and grafts, initial encounter: Secondary | ICD-10-CM | POA: Diagnosis not present

## 2019-05-17 DIAGNOSIS — Z7901 Long term (current) use of anticoagulants: Secondary | ICD-10-CM | POA: Diagnosis not present

## 2019-05-17 DIAGNOSIS — Z992 Dependence on renal dialysis: Secondary | ICD-10-CM | POA: Diagnosis not present

## 2019-05-17 DIAGNOSIS — Z79899 Other long term (current) drug therapy: Secondary | ICD-10-CM | POA: Diagnosis not present

## 2019-05-17 DIAGNOSIS — Z881 Allergy status to other antibiotic agents status: Secondary | ICD-10-CM | POA: Diagnosis not present

## 2019-05-17 DIAGNOSIS — N186 End stage renal disease: Secondary | ICD-10-CM | POA: Diagnosis not present

## 2019-05-17 DIAGNOSIS — E1122 Type 2 diabetes mellitus with diabetic chronic kidney disease: Secondary | ICD-10-CM | POA: Diagnosis not present

## 2019-05-17 DIAGNOSIS — I120 Hypertensive chronic kidney disease with stage 5 chronic kidney disease or end stage renal disease: Secondary | ICD-10-CM | POA: Diagnosis not present

## 2019-05-18 DIAGNOSIS — N2581 Secondary hyperparathyroidism of renal origin: Secondary | ICD-10-CM | POA: Diagnosis not present

## 2019-05-18 DIAGNOSIS — D509 Iron deficiency anemia, unspecified: Secondary | ICD-10-CM | POA: Diagnosis not present

## 2019-05-18 DIAGNOSIS — Z418 Encounter for other procedures for purposes other than remedying health state: Secondary | ICD-10-CM | POA: Diagnosis not present

## 2019-05-18 DIAGNOSIS — N186 End stage renal disease: Secondary | ICD-10-CM | POA: Diagnosis not present

## 2019-05-20 DIAGNOSIS — N186 End stage renal disease: Secondary | ICD-10-CM | POA: Diagnosis not present

## 2019-05-20 DIAGNOSIS — N2581 Secondary hyperparathyroidism of renal origin: Secondary | ICD-10-CM | POA: Diagnosis not present

## 2019-05-20 DIAGNOSIS — Z418 Encounter for other procedures for purposes other than remedying health state: Secondary | ICD-10-CM | POA: Diagnosis not present

## 2019-05-20 DIAGNOSIS — E1122 Type 2 diabetes mellitus with diabetic chronic kidney disease: Secondary | ICD-10-CM | POA: Diagnosis not present

## 2019-05-20 DIAGNOSIS — D509 Iron deficiency anemia, unspecified: Secondary | ICD-10-CM | POA: Diagnosis not present

## 2019-05-20 DIAGNOSIS — Z7901 Long term (current) use of anticoagulants: Secondary | ICD-10-CM | POA: Diagnosis not present

## 2019-05-20 DIAGNOSIS — I120 Hypertensive chronic kidney disease with stage 5 chronic kidney disease or end stage renal disease: Secondary | ICD-10-CM | POA: Diagnosis not present

## 2019-05-20 DIAGNOSIS — Z992 Dependence on renal dialysis: Secondary | ICD-10-CM | POA: Diagnosis not present

## 2019-05-20 DIAGNOSIS — T82868A Thrombosis of vascular prosthetic devices, implants and grafts, initial encounter: Secondary | ICD-10-CM | POA: Diagnosis not present

## 2019-05-20 DIAGNOSIS — Z79899 Other long term (current) drug therapy: Secondary | ICD-10-CM | POA: Diagnosis not present

## 2019-05-20 DIAGNOSIS — Z881 Allergy status to other antibiotic agents status: Secondary | ICD-10-CM | POA: Diagnosis not present

## 2019-05-23 DIAGNOSIS — N186 End stage renal disease: Secondary | ICD-10-CM | POA: Diagnosis not present

## 2019-05-23 DIAGNOSIS — D509 Iron deficiency anemia, unspecified: Secondary | ICD-10-CM | POA: Diagnosis not present

## 2019-05-23 DIAGNOSIS — Z418 Encounter for other procedures for purposes other than remedying health state: Secondary | ICD-10-CM | POA: Diagnosis not present

## 2019-05-23 DIAGNOSIS — N2581 Secondary hyperparathyroidism of renal origin: Secondary | ICD-10-CM | POA: Diagnosis not present

## 2019-05-25 DIAGNOSIS — N2581 Secondary hyperparathyroidism of renal origin: Secondary | ICD-10-CM | POA: Diagnosis not present

## 2019-05-25 DIAGNOSIS — N186 End stage renal disease: Secondary | ICD-10-CM | POA: Diagnosis not present

## 2019-05-25 DIAGNOSIS — D509 Iron deficiency anemia, unspecified: Secondary | ICD-10-CM | POA: Diagnosis not present

## 2019-05-25 DIAGNOSIS — Z418 Encounter for other procedures for purposes other than remedying health state: Secondary | ICD-10-CM | POA: Diagnosis not present

## 2019-05-27 DIAGNOSIS — D509 Iron deficiency anemia, unspecified: Secondary | ICD-10-CM | POA: Diagnosis not present

## 2019-05-27 DIAGNOSIS — N186 End stage renal disease: Secondary | ICD-10-CM | POA: Diagnosis not present

## 2019-05-27 DIAGNOSIS — Z418 Encounter for other procedures for purposes other than remedying health state: Secondary | ICD-10-CM | POA: Diagnosis not present

## 2019-05-27 DIAGNOSIS — N2581 Secondary hyperparathyroidism of renal origin: Secondary | ICD-10-CM | POA: Diagnosis not present

## 2019-05-30 DIAGNOSIS — N186 End stage renal disease: Secondary | ICD-10-CM | POA: Diagnosis not present

## 2019-05-30 DIAGNOSIS — Z418 Encounter for other procedures for purposes other than remedying health state: Secondary | ICD-10-CM | POA: Diagnosis not present

## 2019-05-30 DIAGNOSIS — D509 Iron deficiency anemia, unspecified: Secondary | ICD-10-CM | POA: Diagnosis not present

## 2019-05-30 DIAGNOSIS — N2581 Secondary hyperparathyroidism of renal origin: Secondary | ICD-10-CM | POA: Diagnosis not present

## 2019-06-01 DIAGNOSIS — N186 End stage renal disease: Secondary | ICD-10-CM | POA: Diagnosis not present

## 2019-06-01 DIAGNOSIS — D509 Iron deficiency anemia, unspecified: Secondary | ICD-10-CM | POA: Diagnosis not present

## 2019-06-01 DIAGNOSIS — N2581 Secondary hyperparathyroidism of renal origin: Secondary | ICD-10-CM | POA: Diagnosis not present

## 2019-06-01 DIAGNOSIS — Z418 Encounter for other procedures for purposes other than remedying health state: Secondary | ICD-10-CM | POA: Diagnosis not present

## 2019-06-03 DIAGNOSIS — N186 End stage renal disease: Secondary | ICD-10-CM | POA: Diagnosis not present

## 2019-06-03 DIAGNOSIS — D509 Iron deficiency anemia, unspecified: Secondary | ICD-10-CM | POA: Diagnosis not present

## 2019-06-03 DIAGNOSIS — Z992 Dependence on renal dialysis: Secondary | ICD-10-CM | POA: Diagnosis not present

## 2019-06-03 DIAGNOSIS — N2581 Secondary hyperparathyroidism of renal origin: Secondary | ICD-10-CM | POA: Diagnosis not present

## 2019-06-03 DIAGNOSIS — Z418 Encounter for other procedures for purposes other than remedying health state: Secondary | ICD-10-CM | POA: Diagnosis not present

## 2019-06-06 DIAGNOSIS — Z23 Encounter for immunization: Secondary | ICD-10-CM | POA: Diagnosis not present

## 2019-06-06 DIAGNOSIS — N2581 Secondary hyperparathyroidism of renal origin: Secondary | ICD-10-CM | POA: Diagnosis not present

## 2019-06-06 DIAGNOSIS — N186 End stage renal disease: Secondary | ICD-10-CM | POA: Diagnosis not present

## 2019-06-06 DIAGNOSIS — D509 Iron deficiency anemia, unspecified: Secondary | ICD-10-CM | POA: Diagnosis not present

## 2019-06-06 DIAGNOSIS — Z01810 Encounter for preprocedural cardiovascular examination: Secondary | ICD-10-CM | POA: Diagnosis not present

## 2019-06-06 DIAGNOSIS — Z418 Encounter for other procedures for purposes other than remedying health state: Secondary | ICD-10-CM | POA: Diagnosis not present

## 2019-06-06 DIAGNOSIS — Z992 Dependence on renal dialysis: Secondary | ICD-10-CM | POA: Diagnosis not present

## 2019-06-08 DIAGNOSIS — N528 Other male erectile dysfunction: Secondary | ICD-10-CM | POA: Diagnosis not present

## 2019-06-08 DIAGNOSIS — D509 Iron deficiency anemia, unspecified: Secondary | ICD-10-CM | POA: Diagnosis not present

## 2019-06-08 DIAGNOSIS — Z992 Dependence on renal dialysis: Secondary | ICD-10-CM | POA: Diagnosis not present

## 2019-06-08 DIAGNOSIS — N2581 Secondary hyperparathyroidism of renal origin: Secondary | ICD-10-CM | POA: Diagnosis not present

## 2019-06-08 DIAGNOSIS — N186 End stage renal disease: Secondary | ICD-10-CM | POA: Diagnosis not present

## 2019-06-08 DIAGNOSIS — Z418 Encounter for other procedures for purposes other than remedying health state: Secondary | ICD-10-CM | POA: Diagnosis not present

## 2019-06-08 DIAGNOSIS — Z23 Encounter for immunization: Secondary | ICD-10-CM | POA: Diagnosis not present

## 2019-06-08 DIAGNOSIS — E119 Type 2 diabetes mellitus without complications: Secondary | ICD-10-CM | POA: Diagnosis not present

## 2019-06-10 DIAGNOSIS — Z418 Encounter for other procedures for purposes other than remedying health state: Secondary | ICD-10-CM | POA: Diagnosis not present

## 2019-06-10 DIAGNOSIS — N186 End stage renal disease: Secondary | ICD-10-CM | POA: Diagnosis not present

## 2019-06-10 DIAGNOSIS — D509 Iron deficiency anemia, unspecified: Secondary | ICD-10-CM | POA: Diagnosis not present

## 2019-06-10 DIAGNOSIS — Z23 Encounter for immunization: Secondary | ICD-10-CM | POA: Diagnosis not present

## 2019-06-10 DIAGNOSIS — N2581 Secondary hyperparathyroidism of renal origin: Secondary | ICD-10-CM | POA: Diagnosis not present

## 2019-06-13 DIAGNOSIS — N2581 Secondary hyperparathyroidism of renal origin: Secondary | ICD-10-CM | POA: Diagnosis not present

## 2019-06-13 DIAGNOSIS — N186 End stage renal disease: Secondary | ICD-10-CM | POA: Diagnosis not present

## 2019-06-13 DIAGNOSIS — Z23 Encounter for immunization: Secondary | ICD-10-CM | POA: Diagnosis not present

## 2019-06-13 DIAGNOSIS — Z418 Encounter for other procedures for purposes other than remedying health state: Secondary | ICD-10-CM | POA: Diagnosis not present

## 2019-06-13 DIAGNOSIS — D509 Iron deficiency anemia, unspecified: Secondary | ICD-10-CM | POA: Diagnosis not present

## 2019-06-16 DIAGNOSIS — Z23 Encounter for immunization: Secondary | ICD-10-CM | POA: Diagnosis not present

## 2019-06-16 DIAGNOSIS — D509 Iron deficiency anemia, unspecified: Secondary | ICD-10-CM | POA: Diagnosis not present

## 2019-06-16 DIAGNOSIS — Z418 Encounter for other procedures for purposes other than remedying health state: Secondary | ICD-10-CM | POA: Diagnosis not present

## 2019-06-16 DIAGNOSIS — N186 End stage renal disease: Secondary | ICD-10-CM | POA: Diagnosis not present

## 2019-06-16 DIAGNOSIS — N2581 Secondary hyperparathyroidism of renal origin: Secondary | ICD-10-CM | POA: Diagnosis not present

## 2019-06-17 DIAGNOSIS — N186 End stage renal disease: Secondary | ICD-10-CM | POA: Diagnosis not present

## 2019-06-17 DIAGNOSIS — Z992 Dependence on renal dialysis: Secondary | ICD-10-CM | POA: Diagnosis not present

## 2019-06-17 IMAGING — US RENAL/URINARY TRACT ULTRASOUND
1 series · 13 of 25 positions shown · non-contrast
Comparison: CT abdomen and pelvis 04/03/2017

CLINICAL DATA: Gross hematuria

EXAM:
RENAL / URINARY TRACT ULTRASOUND COMPLETE

[Series 1: renal/urinary tract ultrasound · 0.24mm/px · 13 of 118 slices shown]
[im 1/118]
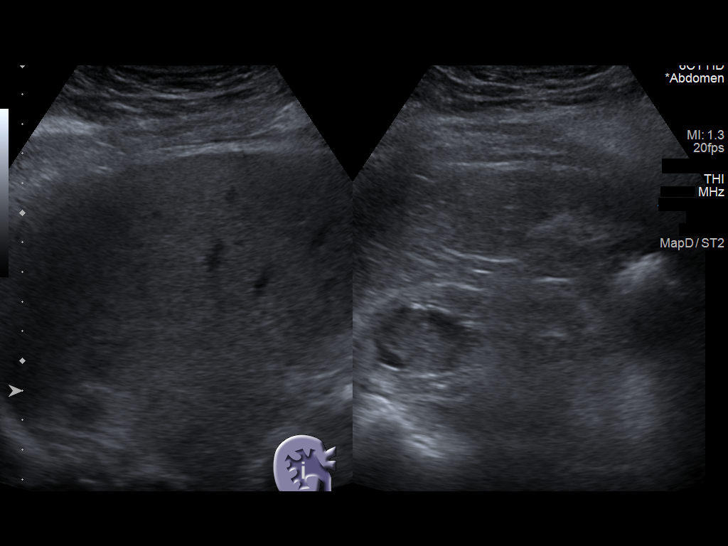
[im 10/118]
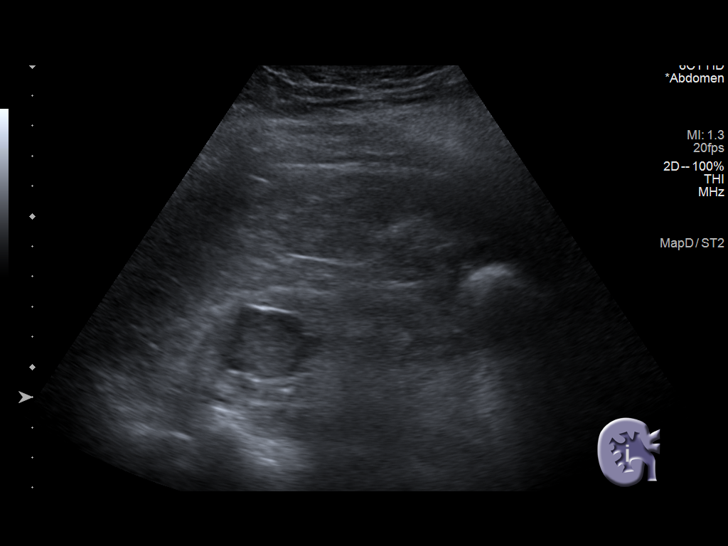
[im 20/118]
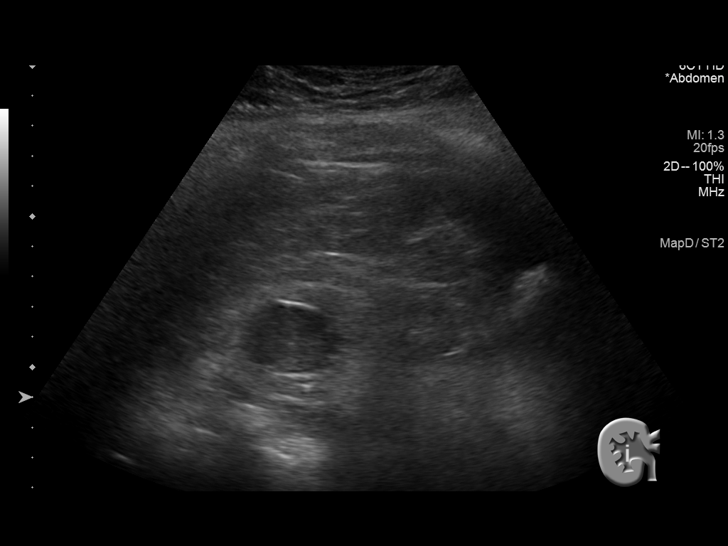
[im 30/118]
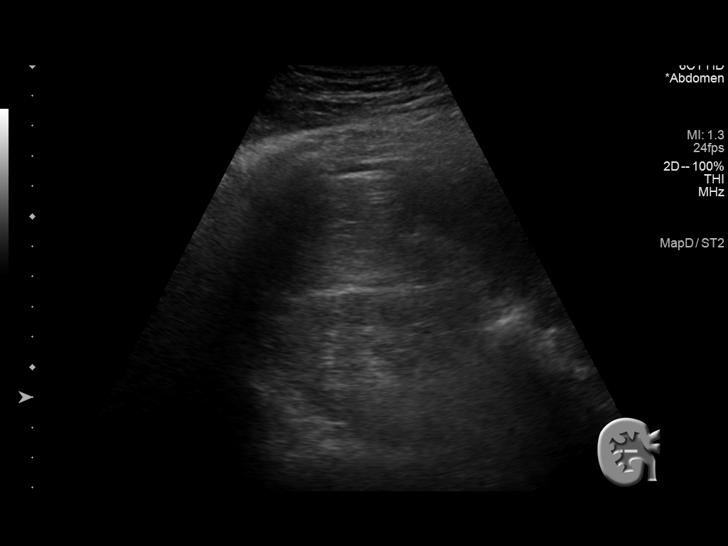
[im 40/118]
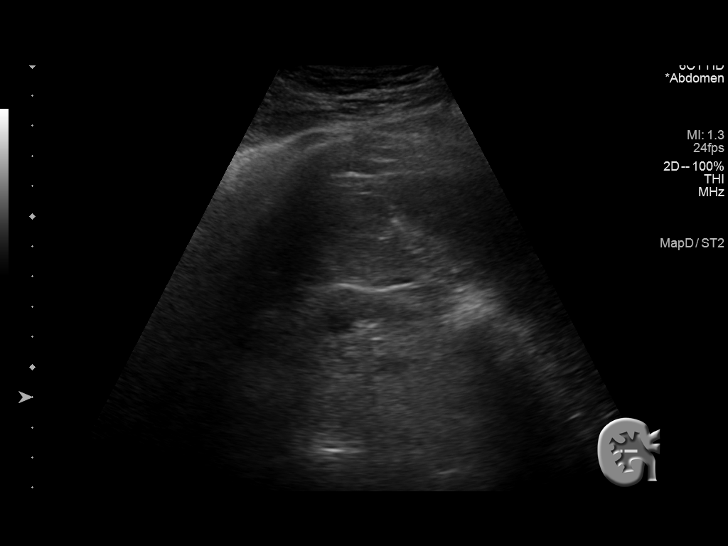
[im 49/118]
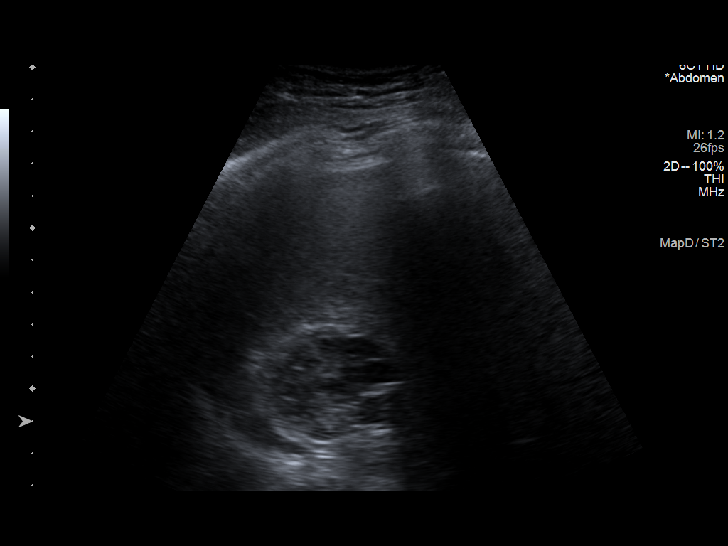
[im 59/118]
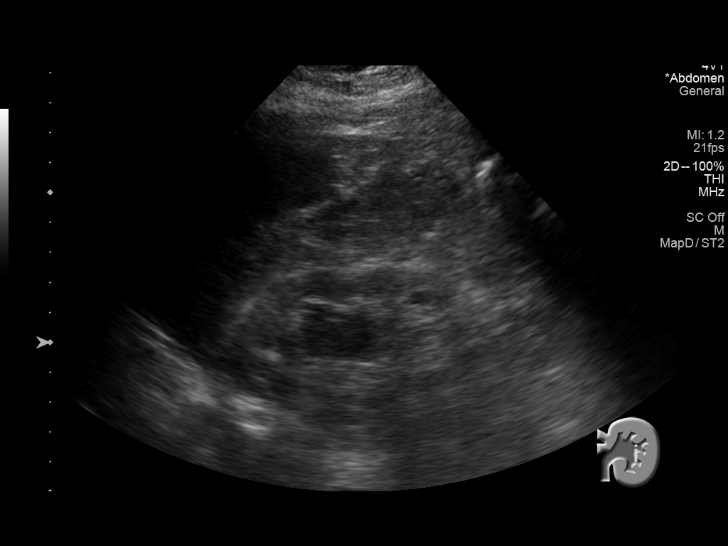
[im 69/118]
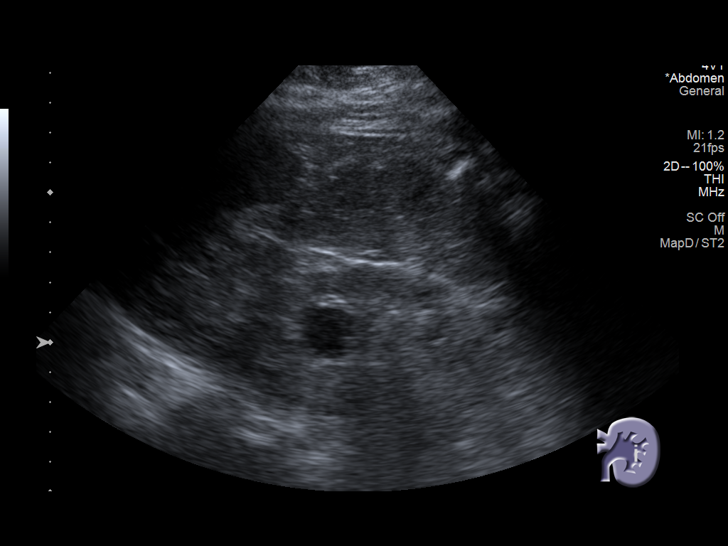
[im 79/118]
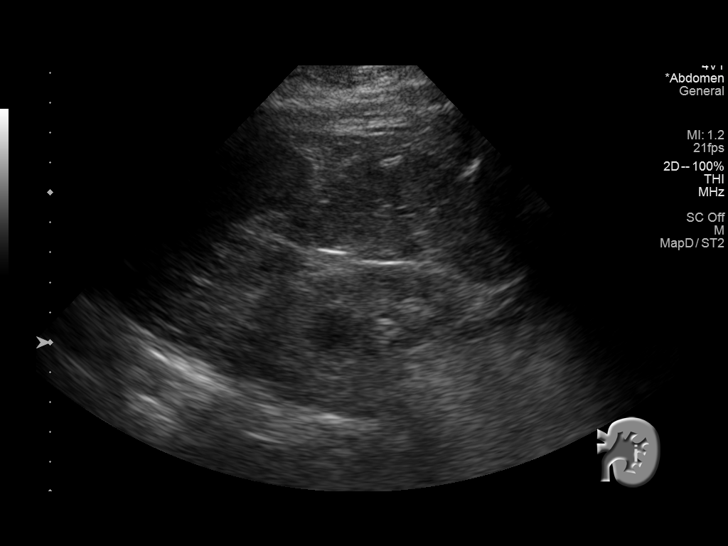
[im 88/118]
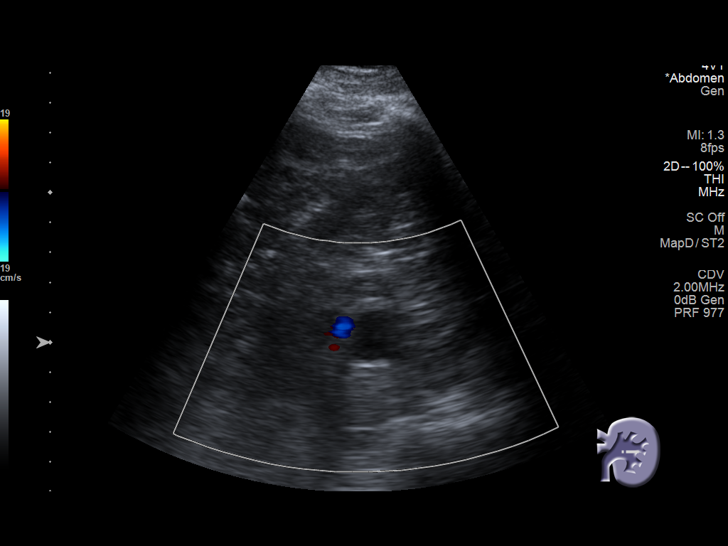
[im 98/118]
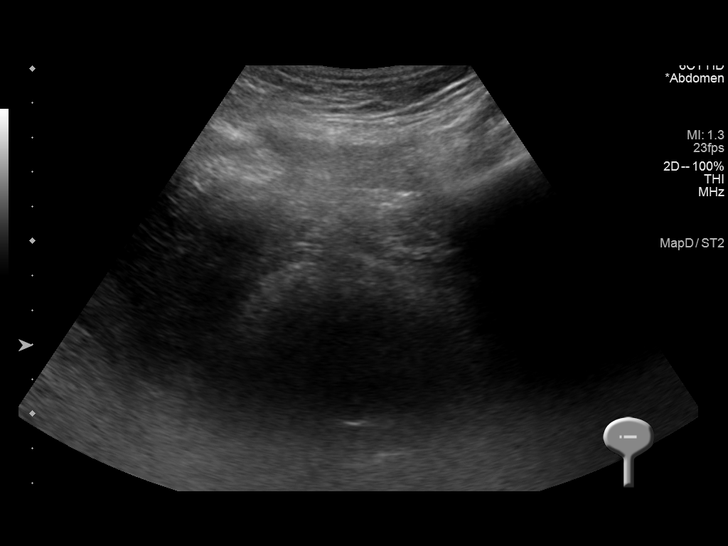
[im 108/118]
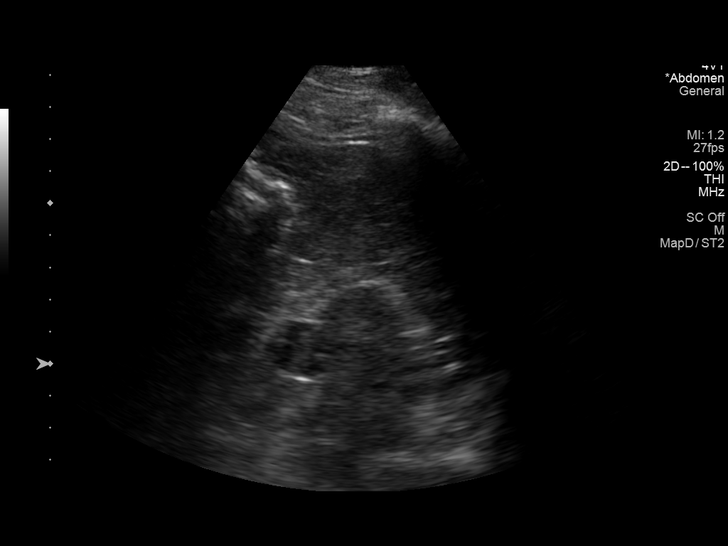
[im 118/118]
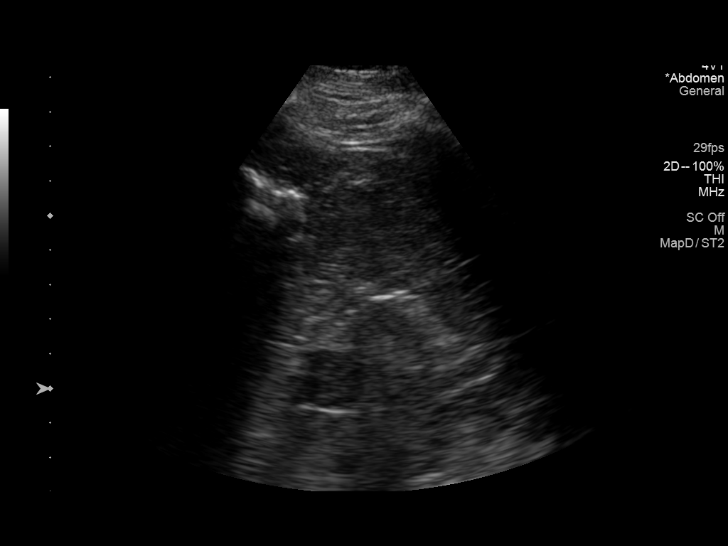

[13 of 25 positions shown; findings below may reference images not displayed]

FINDINGS: Right Kidney:

Renal measurements: 8.7 x 5.1 x 6.5 cm = volume: 152 mL. Cortical
thinning. Increased cortical echogenicity. Hypoechoic mass at upper
pole with scattered low level internal echogenicity measuring 4.2 x
3.6 x 2.3 cm, previously 3.2 x 2.8 x 3.6 cm. No definite blood flow
is detected within this lesion by color Doppler imaging. No new
masses or hydronephrosis. No shadowing calculi.

Left Kidney:

Renal measurements: 8.8 x 4.9 x 5.2 cm = volume: 118 mL. Cortical
thinning. Peripelvic cysts LEFT kidney 1.7 x 1.8 x 1.7 cm.
Additional hypoechoic nodule mid LEFT kidney 2.0 x 1.7 x 2.0 cm,
containing diffuse low-level hypoechogenicity internally, previously
1.7 x 1.6 x 1.7 cm. No hydronephrosis or shadowing calculi. No new
masses.

Bladder:

Decompressed, inadequately assessed.
IMPRESSION: Hypoechoic masses are identified within both kidneys, 4.2 x 3.6 x
2.3 cm RIGHT and 2.0 x 1.7 x 2.0 cm LEFT, both slightly larger than
on the previous exam.

While no definite internal blood flow is seen by color Doppler
imaging, cannot exclude these representing hypovascular neoplasms.

These have previously been assessed by MR imaging and classified as
Bosniak 2F lesions; recommend repeat MR imaging without contrast
(due to chronic renal failure) to assess for interval change.

## 2019-06-18 DIAGNOSIS — N186 End stage renal disease: Secondary | ICD-10-CM | POA: Diagnosis not present

## 2019-06-18 DIAGNOSIS — N2581 Secondary hyperparathyroidism of renal origin: Secondary | ICD-10-CM | POA: Diagnosis not present

## 2019-06-18 DIAGNOSIS — Z418 Encounter for other procedures for purposes other than remedying health state: Secondary | ICD-10-CM | POA: Diagnosis not present

## 2019-06-18 DIAGNOSIS — D509 Iron deficiency anemia, unspecified: Secondary | ICD-10-CM | POA: Diagnosis not present

## 2019-06-18 DIAGNOSIS — Z23 Encounter for immunization: Secondary | ICD-10-CM | POA: Diagnosis not present

## 2019-06-20 DIAGNOSIS — N2581 Secondary hyperparathyroidism of renal origin: Secondary | ICD-10-CM | POA: Diagnosis not present

## 2019-06-20 DIAGNOSIS — N186 End stage renal disease: Secondary | ICD-10-CM | POA: Diagnosis not present

## 2019-06-20 DIAGNOSIS — Z418 Encounter for other procedures for purposes other than remedying health state: Secondary | ICD-10-CM | POA: Diagnosis not present

## 2019-06-20 DIAGNOSIS — Z23 Encounter for immunization: Secondary | ICD-10-CM | POA: Diagnosis not present

## 2019-06-20 DIAGNOSIS — D509 Iron deficiency anemia, unspecified: Secondary | ICD-10-CM | POA: Diagnosis not present

## 2019-06-22 DIAGNOSIS — Z418 Encounter for other procedures for purposes other than remedying health state: Secondary | ICD-10-CM | POA: Diagnosis not present

## 2019-06-22 DIAGNOSIS — D509 Iron deficiency anemia, unspecified: Secondary | ICD-10-CM | POA: Diagnosis not present

## 2019-06-22 DIAGNOSIS — Z23 Encounter for immunization: Secondary | ICD-10-CM | POA: Diagnosis not present

## 2019-06-22 DIAGNOSIS — N2581 Secondary hyperparathyroidism of renal origin: Secondary | ICD-10-CM | POA: Diagnosis not present

## 2019-06-22 DIAGNOSIS — N186 End stage renal disease: Secondary | ICD-10-CM | POA: Diagnosis not present

## 2019-06-24 DIAGNOSIS — M1A09X0 Idiopathic chronic gout, multiple sites, without tophus (tophi): Secondary | ICD-10-CM | POA: Diagnosis not present

## 2019-06-24 DIAGNOSIS — N2581 Secondary hyperparathyroidism of renal origin: Secondary | ICD-10-CM | POA: Diagnosis not present

## 2019-06-24 DIAGNOSIS — N529 Male erectile dysfunction, unspecified: Secondary | ICD-10-CM | POA: Diagnosis not present

## 2019-06-24 DIAGNOSIS — Z418 Encounter for other procedures for purposes other than remedying health state: Secondary | ICD-10-CM | POA: Diagnosis not present

## 2019-06-24 DIAGNOSIS — Z23 Encounter for immunization: Secondary | ICD-10-CM | POA: Diagnosis not present

## 2019-06-24 DIAGNOSIS — Z7901 Long term (current) use of anticoagulants: Secondary | ICD-10-CM | POA: Diagnosis not present

## 2019-06-24 DIAGNOSIS — Z992 Dependence on renal dialysis: Secondary | ICD-10-CM | POA: Diagnosis not present

## 2019-06-24 DIAGNOSIS — E1142 Type 2 diabetes mellitus with diabetic polyneuropathy: Secondary | ICD-10-CM | POA: Diagnosis not present

## 2019-06-24 DIAGNOSIS — D509 Iron deficiency anemia, unspecified: Secondary | ICD-10-CM | POA: Diagnosis not present

## 2019-06-24 DIAGNOSIS — E1122 Type 2 diabetes mellitus with diabetic chronic kidney disease: Secondary | ICD-10-CM | POA: Diagnosis not present

## 2019-06-24 DIAGNOSIS — N186 End stage renal disease: Secondary | ICD-10-CM | POA: Diagnosis not present

## 2019-06-24 DIAGNOSIS — I82401 Acute embolism and thrombosis of unspecified deep veins of right lower extremity: Secondary | ICD-10-CM | POA: Diagnosis not present

## 2019-06-27 DIAGNOSIS — D509 Iron deficiency anemia, unspecified: Secondary | ICD-10-CM | POA: Diagnosis not present

## 2019-06-27 DIAGNOSIS — N2581 Secondary hyperparathyroidism of renal origin: Secondary | ICD-10-CM | POA: Diagnosis not present

## 2019-06-27 DIAGNOSIS — Z23 Encounter for immunization: Secondary | ICD-10-CM | POA: Diagnosis not present

## 2019-06-27 DIAGNOSIS — N186 End stage renal disease: Secondary | ICD-10-CM | POA: Diagnosis not present

## 2019-06-27 DIAGNOSIS — Z418 Encounter for other procedures for purposes other than remedying health state: Secondary | ICD-10-CM | POA: Diagnosis not present

## 2019-06-27 DIAGNOSIS — E119 Type 2 diabetes mellitus without complications: Secondary | ICD-10-CM | POA: Diagnosis not present

## 2019-06-27 DIAGNOSIS — E1140 Type 2 diabetes mellitus with diabetic neuropathy, unspecified: Secondary | ICD-10-CM | POA: Diagnosis not present

## 2019-06-27 DIAGNOSIS — Z992 Dependence on renal dialysis: Secondary | ICD-10-CM | POA: Diagnosis not present

## 2019-06-30 DIAGNOSIS — Z418 Encounter for other procedures for purposes other than remedying health state: Secondary | ICD-10-CM | POA: Diagnosis not present

## 2019-06-30 DIAGNOSIS — N186 End stage renal disease: Secondary | ICD-10-CM | POA: Diagnosis not present

## 2019-06-30 DIAGNOSIS — Z01812 Encounter for preprocedural laboratory examination: Secondary | ICD-10-CM | POA: Diagnosis not present

## 2019-06-30 DIAGNOSIS — Z20822 Contact with and (suspected) exposure to covid-19: Secondary | ICD-10-CM | POA: Diagnosis not present

## 2019-06-30 DIAGNOSIS — D509 Iron deficiency anemia, unspecified: Secondary | ICD-10-CM | POA: Diagnosis not present

## 2019-06-30 DIAGNOSIS — Z23 Encounter for immunization: Secondary | ICD-10-CM | POA: Diagnosis not present

## 2019-06-30 DIAGNOSIS — N2581 Secondary hyperparathyroidism of renal origin: Secondary | ICD-10-CM | POA: Diagnosis not present

## 2019-07-01 DIAGNOSIS — N186 End stage renal disease: Secondary | ICD-10-CM | POA: Diagnosis not present

## 2019-07-01 DIAGNOSIS — D509 Iron deficiency anemia, unspecified: Secondary | ICD-10-CM | POA: Diagnosis not present

## 2019-07-01 DIAGNOSIS — Z418 Encounter for other procedures for purposes other than remedying health state: Secondary | ICD-10-CM | POA: Diagnosis not present

## 2019-07-01 DIAGNOSIS — Z23 Encounter for immunization: Secondary | ICD-10-CM | POA: Diagnosis not present

## 2019-07-01 DIAGNOSIS — N2581 Secondary hyperparathyroidism of renal origin: Secondary | ICD-10-CM | POA: Diagnosis not present

## 2019-07-04 DIAGNOSIS — N186 End stage renal disease: Secondary | ICD-10-CM | POA: Diagnosis not present

## 2019-07-04 DIAGNOSIS — D509 Iron deficiency anemia, unspecified: Secondary | ICD-10-CM | POA: Diagnosis not present

## 2019-07-04 DIAGNOSIS — N2581 Secondary hyperparathyroidism of renal origin: Secondary | ICD-10-CM | POA: Diagnosis not present

## 2019-07-04 DIAGNOSIS — Z418 Encounter for other procedures for purposes other than remedying health state: Secondary | ICD-10-CM | POA: Diagnosis not present

## 2019-07-04 DIAGNOSIS — Z23 Encounter for immunization: Secondary | ICD-10-CM | POA: Diagnosis not present

## 2019-07-04 DIAGNOSIS — Z992 Dependence on renal dialysis: Secondary | ICD-10-CM | POA: Diagnosis not present

## 2019-07-06 DIAGNOSIS — Z79899 Other long term (current) drug therapy: Secondary | ICD-10-CM | POA: Diagnosis not present

## 2019-07-06 DIAGNOSIS — Z992 Dependence on renal dialysis: Secondary | ICD-10-CM | POA: Diagnosis not present

## 2019-07-06 DIAGNOSIS — N186 End stage renal disease: Secondary | ICD-10-CM | POA: Diagnosis not present

## 2019-07-06 DIAGNOSIS — Z86718 Personal history of other venous thrombosis and embolism: Secondary | ICD-10-CM | POA: Diagnosis not present

## 2019-07-06 DIAGNOSIS — I120 Hypertensive chronic kidney disease with stage 5 chronic kidney disease or end stage renal disease: Secondary | ICD-10-CM | POA: Diagnosis not present

## 2019-07-06 DIAGNOSIS — Z7901 Long term (current) use of anticoagulants: Secondary | ICD-10-CM | POA: Diagnosis not present

## 2019-07-06 DIAGNOSIS — E1122 Type 2 diabetes mellitus with diabetic chronic kidney disease: Secondary | ICD-10-CM | POA: Diagnosis not present

## 2019-07-06 DIAGNOSIS — F419 Anxiety disorder, unspecified: Secondary | ICD-10-CM | POA: Diagnosis not present

## 2019-07-14 DIAGNOSIS — N186 End stage renal disease: Secondary | ICD-10-CM | POA: Diagnosis not present

## 2019-07-14 DIAGNOSIS — E1140 Type 2 diabetes mellitus with diabetic neuropathy, unspecified: Secondary | ICD-10-CM | POA: Diagnosis not present

## 2019-07-14 DIAGNOSIS — Z992 Dependence on renal dialysis: Secondary | ICD-10-CM | POA: Diagnosis not present

## 2019-07-14 DIAGNOSIS — L02214 Cutaneous abscess of groin: Secondary | ICD-10-CM | POA: Diagnosis not present

## 2019-07-20 DIAGNOSIS — E1140 Type 2 diabetes mellitus with diabetic neuropathy, unspecified: Secondary | ICD-10-CM | POA: Diagnosis not present

## 2019-07-20 DIAGNOSIS — Z992 Dependence on renal dialysis: Secondary | ICD-10-CM | POA: Diagnosis not present

## 2019-07-20 DIAGNOSIS — L02214 Cutaneous abscess of groin: Secondary | ICD-10-CM | POA: Diagnosis not present

## 2019-07-20 DIAGNOSIS — N186 End stage renal disease: Secondary | ICD-10-CM | POA: Diagnosis not present

## 2019-08-03 DIAGNOSIS — N186 End stage renal disease: Secondary | ICD-10-CM | POA: Diagnosis not present

## 2019-08-03 DIAGNOSIS — Z992 Dependence on renal dialysis: Secondary | ICD-10-CM | POA: Diagnosis not present

## 2019-08-05 DIAGNOSIS — Z418 Encounter for other procedures for purposes other than remedying health state: Secondary | ICD-10-CM | POA: Diagnosis not present

## 2019-08-05 DIAGNOSIS — N2581 Secondary hyperparathyroidism of renal origin: Secondary | ICD-10-CM | POA: Diagnosis not present

## 2019-08-05 DIAGNOSIS — D509 Iron deficiency anemia, unspecified: Secondary | ICD-10-CM | POA: Diagnosis not present

## 2019-08-05 DIAGNOSIS — N186 End stage renal disease: Secondary | ICD-10-CM | POA: Diagnosis not present

## 2019-08-08 DIAGNOSIS — D509 Iron deficiency anemia, unspecified: Secondary | ICD-10-CM | POA: Diagnosis not present

## 2019-08-08 DIAGNOSIS — N186 End stage renal disease: Secondary | ICD-10-CM | POA: Diagnosis not present

## 2019-08-08 DIAGNOSIS — N2581 Secondary hyperparathyroidism of renal origin: Secondary | ICD-10-CM | POA: Diagnosis not present

## 2019-08-08 DIAGNOSIS — Z418 Encounter for other procedures for purposes other than remedying health state: Secondary | ICD-10-CM | POA: Diagnosis not present

## 2019-08-10 DIAGNOSIS — E119 Type 2 diabetes mellitus without complications: Secondary | ICD-10-CM | POA: Diagnosis not present

## 2019-08-10 DIAGNOSIS — Z418 Encounter for other procedures for purposes other than remedying health state: Secondary | ICD-10-CM | POA: Diagnosis not present

## 2019-08-10 DIAGNOSIS — N186 End stage renal disease: Secondary | ICD-10-CM | POA: Diagnosis not present

## 2019-08-10 DIAGNOSIS — N2581 Secondary hyperparathyroidism of renal origin: Secondary | ICD-10-CM | POA: Diagnosis not present

## 2019-08-10 DIAGNOSIS — D509 Iron deficiency anemia, unspecified: Secondary | ICD-10-CM | POA: Diagnosis not present

## 2019-08-12 DIAGNOSIS — Z418 Encounter for other procedures for purposes other than remedying health state: Secondary | ICD-10-CM | POA: Diagnosis not present

## 2019-08-12 DIAGNOSIS — D509 Iron deficiency anemia, unspecified: Secondary | ICD-10-CM | POA: Diagnosis not present

## 2019-08-12 DIAGNOSIS — N186 End stage renal disease: Secondary | ICD-10-CM | POA: Diagnosis not present

## 2019-08-12 DIAGNOSIS — N2581 Secondary hyperparathyroidism of renal origin: Secondary | ICD-10-CM | POA: Diagnosis not present

## 2019-08-15 DIAGNOSIS — N186 End stage renal disease: Secondary | ICD-10-CM | POA: Diagnosis not present

## 2019-08-15 DIAGNOSIS — D509 Iron deficiency anemia, unspecified: Secondary | ICD-10-CM | POA: Diagnosis not present

## 2019-08-15 DIAGNOSIS — Z418 Encounter for other procedures for purposes other than remedying health state: Secondary | ICD-10-CM | POA: Diagnosis not present

## 2019-08-15 DIAGNOSIS — N2581 Secondary hyperparathyroidism of renal origin: Secondary | ICD-10-CM | POA: Diagnosis not present

## 2019-08-17 DIAGNOSIS — N186 End stage renal disease: Secondary | ICD-10-CM | POA: Diagnosis not present

## 2019-08-17 DIAGNOSIS — N2581 Secondary hyperparathyroidism of renal origin: Secondary | ICD-10-CM | POA: Diagnosis not present

## 2019-08-17 DIAGNOSIS — D509 Iron deficiency anemia, unspecified: Secondary | ICD-10-CM | POA: Diagnosis not present

## 2019-08-17 DIAGNOSIS — Z418 Encounter for other procedures for purposes other than remedying health state: Secondary | ICD-10-CM | POA: Diagnosis not present

## 2019-08-19 DIAGNOSIS — Z418 Encounter for other procedures for purposes other than remedying health state: Secondary | ICD-10-CM | POA: Diagnosis not present

## 2019-08-19 DIAGNOSIS — N2581 Secondary hyperparathyroidism of renal origin: Secondary | ICD-10-CM | POA: Diagnosis not present

## 2019-08-19 DIAGNOSIS — D509 Iron deficiency anemia, unspecified: Secondary | ICD-10-CM | POA: Diagnosis not present

## 2019-08-19 DIAGNOSIS — N186 End stage renal disease: Secondary | ICD-10-CM | POA: Diagnosis not present

## 2019-08-22 DIAGNOSIS — D509 Iron deficiency anemia, unspecified: Secondary | ICD-10-CM | POA: Diagnosis not present

## 2019-08-22 DIAGNOSIS — Z418 Encounter for other procedures for purposes other than remedying health state: Secondary | ICD-10-CM | POA: Diagnosis not present

## 2019-08-22 DIAGNOSIS — N2581 Secondary hyperparathyroidism of renal origin: Secondary | ICD-10-CM | POA: Diagnosis not present

## 2019-08-22 DIAGNOSIS — N186 End stage renal disease: Secondary | ICD-10-CM | POA: Diagnosis not present

## 2019-08-24 DIAGNOSIS — N2581 Secondary hyperparathyroidism of renal origin: Secondary | ICD-10-CM | POA: Diagnosis not present

## 2019-08-24 DIAGNOSIS — D509 Iron deficiency anemia, unspecified: Secondary | ICD-10-CM | POA: Diagnosis not present

## 2019-08-24 DIAGNOSIS — Z418 Encounter for other procedures for purposes other than remedying health state: Secondary | ICD-10-CM | POA: Diagnosis not present

## 2019-08-24 DIAGNOSIS — N186 End stage renal disease: Secondary | ICD-10-CM | POA: Diagnosis not present

## 2019-08-26 DIAGNOSIS — D509 Iron deficiency anemia, unspecified: Secondary | ICD-10-CM | POA: Diagnosis not present

## 2019-08-26 DIAGNOSIS — N2581 Secondary hyperparathyroidism of renal origin: Secondary | ICD-10-CM | POA: Diagnosis not present

## 2019-08-26 DIAGNOSIS — Z418 Encounter for other procedures for purposes other than remedying health state: Secondary | ICD-10-CM | POA: Diagnosis not present

## 2019-08-26 DIAGNOSIS — N186 End stage renal disease: Secondary | ICD-10-CM | POA: Diagnosis not present

## 2019-08-29 DIAGNOSIS — E119 Type 2 diabetes mellitus without complications: Secondary | ICD-10-CM | POA: Diagnosis not present

## 2019-08-29 DIAGNOSIS — L819 Disorder of pigmentation, unspecified: Secondary | ICD-10-CM | POA: Diagnosis not present

## 2019-08-29 DIAGNOSIS — M79672 Pain in left foot: Secondary | ICD-10-CM | POA: Diagnosis not present

## 2019-08-29 DIAGNOSIS — M19071 Primary osteoarthritis, right ankle and foot: Secondary | ICD-10-CM | POA: Diagnosis not present

## 2019-08-29 DIAGNOSIS — M205X1 Other deformities of toe(s) (acquired), right foot: Secondary | ICD-10-CM | POA: Diagnosis not present

## 2019-08-29 DIAGNOSIS — B351 Tinea unguium: Secondary | ICD-10-CM | POA: Diagnosis not present

## 2019-08-29 DIAGNOSIS — L602 Onychogryphosis: Secondary | ICD-10-CM | POA: Diagnosis not present

## 2019-08-29 DIAGNOSIS — M2041 Other hammer toe(s) (acquired), right foot: Secondary | ICD-10-CM | POA: Diagnosis not present

## 2019-08-29 DIAGNOSIS — M205X2 Other deformities of toe(s) (acquired), left foot: Secondary | ICD-10-CM | POA: Diagnosis not present

## 2019-08-29 DIAGNOSIS — M19072 Primary osteoarthritis, left ankle and foot: Secondary | ICD-10-CM | POA: Diagnosis not present

## 2019-08-29 DIAGNOSIS — M79671 Pain in right foot: Secondary | ICD-10-CM | POA: Diagnosis not present

## 2019-08-29 DIAGNOSIS — M2042 Other hammer toe(s) (acquired), left foot: Secondary | ICD-10-CM | POA: Diagnosis not present

## 2019-08-29 DIAGNOSIS — L603 Nail dystrophy: Secondary | ICD-10-CM | POA: Diagnosis not present

## 2019-08-29 DIAGNOSIS — L601 Onycholysis: Secondary | ICD-10-CM | POA: Diagnosis not present

## 2019-08-30 DIAGNOSIS — D509 Iron deficiency anemia, unspecified: Secondary | ICD-10-CM | POA: Diagnosis not present

## 2019-08-30 DIAGNOSIS — N186 End stage renal disease: Secondary | ICD-10-CM | POA: Diagnosis not present

## 2019-08-30 DIAGNOSIS — Z418 Encounter for other procedures for purposes other than remedying health state: Secondary | ICD-10-CM | POA: Diagnosis not present

## 2019-08-30 DIAGNOSIS — N2581 Secondary hyperparathyroidism of renal origin: Secondary | ICD-10-CM | POA: Diagnosis not present

## 2019-08-31 DIAGNOSIS — N2581 Secondary hyperparathyroidism of renal origin: Secondary | ICD-10-CM | POA: Diagnosis not present

## 2019-08-31 DIAGNOSIS — N186 End stage renal disease: Secondary | ICD-10-CM | POA: Diagnosis not present

## 2019-08-31 DIAGNOSIS — D509 Iron deficiency anemia, unspecified: Secondary | ICD-10-CM | POA: Diagnosis not present

## 2019-08-31 DIAGNOSIS — Z418 Encounter for other procedures for purposes other than remedying health state: Secondary | ICD-10-CM | POA: Diagnosis not present

## 2019-09-02 DIAGNOSIS — Z418 Encounter for other procedures for purposes other than remedying health state: Secondary | ICD-10-CM | POA: Diagnosis not present

## 2019-09-02 DIAGNOSIS — N186 End stage renal disease: Secondary | ICD-10-CM | POA: Diagnosis not present

## 2019-09-02 DIAGNOSIS — D509 Iron deficiency anemia, unspecified: Secondary | ICD-10-CM | POA: Diagnosis not present

## 2019-09-02 DIAGNOSIS — N2581 Secondary hyperparathyroidism of renal origin: Secondary | ICD-10-CM | POA: Diagnosis not present

## 2019-09-03 DIAGNOSIS — N186 End stage renal disease: Secondary | ICD-10-CM | POA: Diagnosis not present

## 2019-09-03 DIAGNOSIS — Z992 Dependence on renal dialysis: Secondary | ICD-10-CM | POA: Diagnosis not present

## 2019-09-05 DIAGNOSIS — Z418 Encounter for other procedures for purposes other than remedying health state: Secondary | ICD-10-CM | POA: Diagnosis not present

## 2019-09-05 DIAGNOSIS — N186 End stage renal disease: Secondary | ICD-10-CM | POA: Diagnosis not present

## 2019-09-05 DIAGNOSIS — D509 Iron deficiency anemia, unspecified: Secondary | ICD-10-CM | POA: Diagnosis not present

## 2019-09-05 DIAGNOSIS — N2581 Secondary hyperparathyroidism of renal origin: Secondary | ICD-10-CM | POA: Diagnosis not present

## 2019-09-06 DIAGNOSIS — E1140 Type 2 diabetes mellitus with diabetic neuropathy, unspecified: Secondary | ICD-10-CM | POA: Diagnosis not present

## 2019-09-06 DIAGNOSIS — N186 End stage renal disease: Secondary | ICD-10-CM | POA: Diagnosis not present

## 2019-09-06 DIAGNOSIS — L0291 Cutaneous abscess, unspecified: Secondary | ICD-10-CM | POA: Diagnosis not present

## 2019-09-06 DIAGNOSIS — E119 Type 2 diabetes mellitus without complications: Secondary | ICD-10-CM | POA: Diagnosis not present

## 2019-09-07 DIAGNOSIS — Z418 Encounter for other procedures for purposes other than remedying health state: Secondary | ICD-10-CM | POA: Diagnosis not present

## 2019-09-07 DIAGNOSIS — N186 End stage renal disease: Secondary | ICD-10-CM | POA: Diagnosis not present

## 2019-09-07 DIAGNOSIS — N2581 Secondary hyperparathyroidism of renal origin: Secondary | ICD-10-CM | POA: Diagnosis not present

## 2019-09-07 DIAGNOSIS — D509 Iron deficiency anemia, unspecified: Secondary | ICD-10-CM | POA: Diagnosis not present

## 2019-09-07 DIAGNOSIS — E119 Type 2 diabetes mellitus without complications: Secondary | ICD-10-CM | POA: Diagnosis not present

## 2019-09-09 DIAGNOSIS — N186 End stage renal disease: Secondary | ICD-10-CM | POA: Diagnosis not present

## 2019-09-09 DIAGNOSIS — D509 Iron deficiency anemia, unspecified: Secondary | ICD-10-CM | POA: Diagnosis not present

## 2019-09-09 DIAGNOSIS — N2581 Secondary hyperparathyroidism of renal origin: Secondary | ICD-10-CM | POA: Diagnosis not present

## 2019-09-09 DIAGNOSIS — Z418 Encounter for other procedures for purposes other than remedying health state: Secondary | ICD-10-CM | POA: Diagnosis not present

## 2019-09-12 DIAGNOSIS — N186 End stage renal disease: Secondary | ICD-10-CM | POA: Diagnosis not present

## 2019-09-12 DIAGNOSIS — D509 Iron deficiency anemia, unspecified: Secondary | ICD-10-CM | POA: Diagnosis not present

## 2019-09-12 DIAGNOSIS — N2581 Secondary hyperparathyroidism of renal origin: Secondary | ICD-10-CM | POA: Diagnosis not present

## 2019-09-12 DIAGNOSIS — Z418 Encounter for other procedures for purposes other than remedying health state: Secondary | ICD-10-CM | POA: Diagnosis not present

## 2019-09-13 DIAGNOSIS — R12 Heartburn: Secondary | ICD-10-CM | POA: Diagnosis not present

## 2019-09-13 DIAGNOSIS — R079 Chest pain, unspecified: Secondary | ICD-10-CM | POA: Diagnosis not present

## 2019-09-13 DIAGNOSIS — E663 Overweight: Secondary | ICD-10-CM | POA: Diagnosis not present

## 2019-09-13 DIAGNOSIS — R9431 Abnormal electrocardiogram [ECG] [EKG]: Secondary | ICD-10-CM | POA: Diagnosis not present

## 2019-09-14 DIAGNOSIS — Z418 Encounter for other procedures for purposes other than remedying health state: Secondary | ICD-10-CM | POA: Diagnosis not present

## 2019-09-14 DIAGNOSIS — D509 Iron deficiency anemia, unspecified: Secondary | ICD-10-CM | POA: Diagnosis not present

## 2019-09-14 DIAGNOSIS — N186 End stage renal disease: Secondary | ICD-10-CM | POA: Diagnosis not present

## 2019-09-14 DIAGNOSIS — N2581 Secondary hyperparathyroidism of renal origin: Secondary | ICD-10-CM | POA: Diagnosis not present

## 2019-09-17 DIAGNOSIS — N186 End stage renal disease: Secondary | ICD-10-CM | POA: Diagnosis not present

## 2019-09-17 DIAGNOSIS — Z418 Encounter for other procedures for purposes other than remedying health state: Secondary | ICD-10-CM | POA: Diagnosis not present

## 2019-09-17 DIAGNOSIS — N2581 Secondary hyperparathyroidism of renal origin: Secondary | ICD-10-CM | POA: Diagnosis not present

## 2019-09-19 DIAGNOSIS — Z418 Encounter for other procedures for purposes other than remedying health state: Secondary | ICD-10-CM | POA: Diagnosis not present

## 2019-09-19 DIAGNOSIS — N2581 Secondary hyperparathyroidism of renal origin: Secondary | ICD-10-CM | POA: Diagnosis not present

## 2019-09-19 DIAGNOSIS — N186 End stage renal disease: Secondary | ICD-10-CM | POA: Diagnosis not present

## 2019-09-21 DIAGNOSIS — N186 End stage renal disease: Secondary | ICD-10-CM | POA: Diagnosis not present

## 2019-09-21 DIAGNOSIS — N2581 Secondary hyperparathyroidism of renal origin: Secondary | ICD-10-CM | POA: Diagnosis not present

## 2019-09-21 DIAGNOSIS — Z418 Encounter for other procedures for purposes other than remedying health state: Secondary | ICD-10-CM | POA: Diagnosis not present

## 2019-09-23 DIAGNOSIS — N2581 Secondary hyperparathyroidism of renal origin: Secondary | ICD-10-CM | POA: Diagnosis not present

## 2019-09-23 DIAGNOSIS — Z418 Encounter for other procedures for purposes other than remedying health state: Secondary | ICD-10-CM | POA: Diagnosis not present

## 2019-09-23 DIAGNOSIS — N186 End stage renal disease: Secondary | ICD-10-CM | POA: Diagnosis not present

## 2019-09-24 DIAGNOSIS — E119 Type 2 diabetes mellitus without complications: Secondary | ICD-10-CM | POA: Diagnosis not present

## 2019-09-24 DIAGNOSIS — Z992 Dependence on renal dialysis: Secondary | ICD-10-CM | POA: Diagnosis not present

## 2019-09-24 DIAGNOSIS — E1140 Type 2 diabetes mellitus with diabetic neuropathy, unspecified: Secondary | ICD-10-CM | POA: Diagnosis not present

## 2019-09-24 DIAGNOSIS — N186 End stage renal disease: Secondary | ICD-10-CM | POA: Diagnosis not present

## 2019-09-26 DIAGNOSIS — N186 End stage renal disease: Secondary | ICD-10-CM | POA: Diagnosis not present

## 2019-09-26 DIAGNOSIS — N2581 Secondary hyperparathyroidism of renal origin: Secondary | ICD-10-CM | POA: Diagnosis not present

## 2019-09-26 DIAGNOSIS — Z418 Encounter for other procedures for purposes other than remedying health state: Secondary | ICD-10-CM | POA: Diagnosis not present

## 2019-09-28 DIAGNOSIS — Z418 Encounter for other procedures for purposes other than remedying health state: Secondary | ICD-10-CM | POA: Diagnosis not present

## 2019-09-28 DIAGNOSIS — N2581 Secondary hyperparathyroidism of renal origin: Secondary | ICD-10-CM | POA: Diagnosis not present

## 2019-09-28 DIAGNOSIS — N186 End stage renal disease: Secondary | ICD-10-CM | POA: Diagnosis not present

## 2019-09-30 DIAGNOSIS — N186 End stage renal disease: Secondary | ICD-10-CM | POA: Diagnosis not present

## 2019-09-30 DIAGNOSIS — N2581 Secondary hyperparathyroidism of renal origin: Secondary | ICD-10-CM | POA: Diagnosis not present

## 2019-09-30 DIAGNOSIS — Z418 Encounter for other procedures for purposes other than remedying health state: Secondary | ICD-10-CM | POA: Diagnosis not present

## 2019-10-03 DIAGNOSIS — Z992 Dependence on renal dialysis: Secondary | ICD-10-CM | POA: Diagnosis not present

## 2019-10-03 DIAGNOSIS — Z418 Encounter for other procedures for purposes other than remedying health state: Secondary | ICD-10-CM | POA: Diagnosis not present

## 2019-10-03 DIAGNOSIS — D539 Nutritional anemia, unspecified: Secondary | ICD-10-CM | POA: Diagnosis not present

## 2019-10-03 DIAGNOSIS — D696 Thrombocytopenia, unspecified: Secondary | ICD-10-CM | POA: Diagnosis not present

## 2019-10-03 DIAGNOSIS — N2581 Secondary hyperparathyroidism of renal origin: Secondary | ICD-10-CM | POA: Diagnosis not present

## 2019-10-03 DIAGNOSIS — N186 End stage renal disease: Secondary | ICD-10-CM | POA: Diagnosis not present

## 2019-10-04 DIAGNOSIS — Z992 Dependence on renal dialysis: Secondary | ICD-10-CM | POA: Diagnosis not present

## 2019-10-04 DIAGNOSIS — N186 End stage renal disease: Secondary | ICD-10-CM | POA: Diagnosis not present

## 2019-10-05 DIAGNOSIS — N186 End stage renal disease: Secondary | ICD-10-CM | POA: Diagnosis not present

## 2019-10-05 DIAGNOSIS — N2581 Secondary hyperparathyroidism of renal origin: Secondary | ICD-10-CM | POA: Diagnosis not present

## 2019-10-05 DIAGNOSIS — D631 Anemia in chronic kidney disease: Secondary | ICD-10-CM | POA: Diagnosis not present

## 2019-10-05 DIAGNOSIS — Z418 Encounter for other procedures for purposes other than remedying health state: Secondary | ICD-10-CM | POA: Diagnosis not present

## 2019-10-05 DIAGNOSIS — D509 Iron deficiency anemia, unspecified: Secondary | ICD-10-CM | POA: Diagnosis not present

## 2019-10-05 DIAGNOSIS — E119 Type 2 diabetes mellitus without complications: Secondary | ICD-10-CM | POA: Diagnosis not present

## 2019-10-07 DIAGNOSIS — N186 End stage renal disease: Secondary | ICD-10-CM | POA: Diagnosis not present

## 2019-10-07 DIAGNOSIS — D509 Iron deficiency anemia, unspecified: Secondary | ICD-10-CM | POA: Diagnosis not present

## 2019-10-07 DIAGNOSIS — N2581 Secondary hyperparathyroidism of renal origin: Secondary | ICD-10-CM | POA: Diagnosis not present

## 2019-10-07 DIAGNOSIS — Z418 Encounter for other procedures for purposes other than remedying health state: Secondary | ICD-10-CM | POA: Diagnosis not present

## 2019-10-07 DIAGNOSIS — D631 Anemia in chronic kidney disease: Secondary | ICD-10-CM | POA: Diagnosis not present

## 2019-10-09 DIAGNOSIS — E119 Type 2 diabetes mellitus without complications: Secondary | ICD-10-CM | POA: Diagnosis not present

## 2019-10-09 DIAGNOSIS — Z6836 Body mass index (BMI) 36.0-36.9, adult: Secondary | ICD-10-CM | POA: Diagnosis not present

## 2019-10-09 DIAGNOSIS — S39012A Strain of muscle, fascia and tendon of lower back, initial encounter: Secondary | ICD-10-CM | POA: Diagnosis not present

## 2019-10-09 DIAGNOSIS — N529 Male erectile dysfunction, unspecified: Secondary | ICD-10-CM | POA: Diagnosis not present

## 2019-10-10 DIAGNOSIS — N2581 Secondary hyperparathyroidism of renal origin: Secondary | ICD-10-CM | POA: Diagnosis not present

## 2019-10-10 DIAGNOSIS — D509 Iron deficiency anemia, unspecified: Secondary | ICD-10-CM | POA: Diagnosis not present

## 2019-10-10 DIAGNOSIS — D631 Anemia in chronic kidney disease: Secondary | ICD-10-CM | POA: Diagnosis not present

## 2019-10-10 DIAGNOSIS — N186 End stage renal disease: Secondary | ICD-10-CM | POA: Diagnosis not present

## 2019-10-10 DIAGNOSIS — Z418 Encounter for other procedures for purposes other than remedying health state: Secondary | ICD-10-CM | POA: Diagnosis not present

## 2019-10-12 DIAGNOSIS — N2581 Secondary hyperparathyroidism of renal origin: Secondary | ICD-10-CM | POA: Diagnosis not present

## 2019-10-12 DIAGNOSIS — D509 Iron deficiency anemia, unspecified: Secondary | ICD-10-CM | POA: Diagnosis not present

## 2019-10-12 DIAGNOSIS — N186 End stage renal disease: Secondary | ICD-10-CM | POA: Diagnosis not present

## 2019-10-12 DIAGNOSIS — D631 Anemia in chronic kidney disease: Secondary | ICD-10-CM | POA: Diagnosis not present

## 2019-10-12 DIAGNOSIS — Z418 Encounter for other procedures for purposes other than remedying health state: Secondary | ICD-10-CM | POA: Diagnosis not present

## 2019-10-14 DIAGNOSIS — D509 Iron deficiency anemia, unspecified: Secondary | ICD-10-CM | POA: Diagnosis not present

## 2019-10-14 DIAGNOSIS — D631 Anemia in chronic kidney disease: Secondary | ICD-10-CM | POA: Diagnosis not present

## 2019-10-14 DIAGNOSIS — N2581 Secondary hyperparathyroidism of renal origin: Secondary | ICD-10-CM | POA: Diagnosis not present

## 2019-10-14 DIAGNOSIS — Z418 Encounter for other procedures for purposes other than remedying health state: Secondary | ICD-10-CM | POA: Diagnosis not present

## 2019-10-14 DIAGNOSIS — N186 End stage renal disease: Secondary | ICD-10-CM | POA: Diagnosis not present

## 2019-10-17 DIAGNOSIS — D509 Iron deficiency anemia, unspecified: Secondary | ICD-10-CM | POA: Diagnosis not present

## 2019-10-17 DIAGNOSIS — D631 Anemia in chronic kidney disease: Secondary | ICD-10-CM | POA: Diagnosis not present

## 2019-10-17 DIAGNOSIS — N2581 Secondary hyperparathyroidism of renal origin: Secondary | ICD-10-CM | POA: Diagnosis not present

## 2019-10-17 DIAGNOSIS — N186 End stage renal disease: Secondary | ICD-10-CM | POA: Diagnosis not present

## 2019-10-17 DIAGNOSIS — Z418 Encounter for other procedures for purposes other than remedying health state: Secondary | ICD-10-CM | POA: Diagnosis not present

## 2019-10-19 DIAGNOSIS — Z418 Encounter for other procedures for purposes other than remedying health state: Secondary | ICD-10-CM | POA: Diagnosis not present

## 2019-10-19 DIAGNOSIS — N2581 Secondary hyperparathyroidism of renal origin: Secondary | ICD-10-CM | POA: Diagnosis not present

## 2019-10-19 DIAGNOSIS — D509 Iron deficiency anemia, unspecified: Secondary | ICD-10-CM | POA: Diagnosis not present

## 2019-10-19 DIAGNOSIS — D631 Anemia in chronic kidney disease: Secondary | ICD-10-CM | POA: Diagnosis not present

## 2019-10-19 DIAGNOSIS — N186 End stage renal disease: Secondary | ICD-10-CM | POA: Diagnosis not present

## 2019-10-22 DIAGNOSIS — Z418 Encounter for other procedures for purposes other than remedying health state: Secondary | ICD-10-CM | POA: Diagnosis not present

## 2019-10-22 DIAGNOSIS — N186 End stage renal disease: Secondary | ICD-10-CM | POA: Diagnosis not present

## 2019-10-22 DIAGNOSIS — N2581 Secondary hyperparathyroidism of renal origin: Secondary | ICD-10-CM | POA: Diagnosis not present

## 2019-10-22 DIAGNOSIS — D509 Iron deficiency anemia, unspecified: Secondary | ICD-10-CM | POA: Diagnosis not present

## 2019-10-22 DIAGNOSIS — D631 Anemia in chronic kidney disease: Secondary | ICD-10-CM | POA: Diagnosis not present

## 2019-10-24 DIAGNOSIS — D509 Iron deficiency anemia, unspecified: Secondary | ICD-10-CM | POA: Diagnosis not present

## 2019-10-24 DIAGNOSIS — N2581 Secondary hyperparathyroidism of renal origin: Secondary | ICD-10-CM | POA: Diagnosis not present

## 2019-10-24 DIAGNOSIS — N186 End stage renal disease: Secondary | ICD-10-CM | POA: Diagnosis not present

## 2019-10-24 DIAGNOSIS — D631 Anemia in chronic kidney disease: Secondary | ICD-10-CM | POA: Diagnosis not present

## 2019-10-24 DIAGNOSIS — Z418 Encounter for other procedures for purposes other than remedying health state: Secondary | ICD-10-CM | POA: Diagnosis not present

## 2019-10-26 DIAGNOSIS — Z418 Encounter for other procedures for purposes other than remedying health state: Secondary | ICD-10-CM | POA: Diagnosis not present

## 2019-10-26 DIAGNOSIS — R5383 Other fatigue: Secondary | ICD-10-CM | POA: Diagnosis not present

## 2019-10-26 DIAGNOSIS — Z711 Person with feared health complaint in whom no diagnosis is made: Secondary | ICD-10-CM | POA: Diagnosis not present

## 2019-10-26 DIAGNOSIS — E119 Type 2 diabetes mellitus without complications: Secondary | ICD-10-CM | POA: Diagnosis not present

## 2019-10-26 DIAGNOSIS — D631 Anemia in chronic kidney disease: Secondary | ICD-10-CM | POA: Diagnosis not present

## 2019-10-26 DIAGNOSIS — D509 Iron deficiency anemia, unspecified: Secondary | ICD-10-CM | POA: Diagnosis not present

## 2019-10-26 DIAGNOSIS — E7800 Pure hypercholesterolemia, unspecified: Secondary | ICD-10-CM | POA: Diagnosis not present

## 2019-10-26 DIAGNOSIS — N2581 Secondary hyperparathyroidism of renal origin: Secondary | ICD-10-CM | POA: Diagnosis not present

## 2019-10-26 DIAGNOSIS — I10 Essential (primary) hypertension: Secondary | ICD-10-CM | POA: Diagnosis not present

## 2019-10-26 DIAGNOSIS — Z1339 Encounter for screening examination for other mental health and behavioral disorders: Secondary | ICD-10-CM | POA: Diagnosis not present

## 2019-10-26 DIAGNOSIS — E559 Vitamin D deficiency, unspecified: Secondary | ICD-10-CM | POA: Diagnosis not present

## 2019-10-26 DIAGNOSIS — N186 End stage renal disease: Secondary | ICD-10-CM | POA: Diagnosis not present

## 2019-10-28 DIAGNOSIS — N186 End stage renal disease: Secondary | ICD-10-CM | POA: Diagnosis not present

## 2019-10-28 DIAGNOSIS — Z418 Encounter for other procedures for purposes other than remedying health state: Secondary | ICD-10-CM | POA: Diagnosis not present

## 2019-10-28 DIAGNOSIS — D631 Anemia in chronic kidney disease: Secondary | ICD-10-CM | POA: Diagnosis not present

## 2019-10-28 DIAGNOSIS — N2581 Secondary hyperparathyroidism of renal origin: Secondary | ICD-10-CM | POA: Diagnosis not present

## 2019-10-28 DIAGNOSIS — D509 Iron deficiency anemia, unspecified: Secondary | ICD-10-CM | POA: Diagnosis not present

## 2019-10-31 DIAGNOSIS — N2581 Secondary hyperparathyroidism of renal origin: Secondary | ICD-10-CM | POA: Diagnosis not present

## 2019-10-31 DIAGNOSIS — Z418 Encounter for other procedures for purposes other than remedying health state: Secondary | ICD-10-CM | POA: Diagnosis not present

## 2019-10-31 DIAGNOSIS — D631 Anemia in chronic kidney disease: Secondary | ICD-10-CM | POA: Diagnosis not present

## 2019-10-31 DIAGNOSIS — N186 End stage renal disease: Secondary | ICD-10-CM | POA: Diagnosis not present

## 2019-10-31 DIAGNOSIS — D509 Iron deficiency anemia, unspecified: Secondary | ICD-10-CM | POA: Diagnosis not present

## 2019-11-01 DIAGNOSIS — T8249XA Other complication of vascular dialysis catheter, initial encounter: Secondary | ICD-10-CM | POA: Diagnosis not present

## 2019-11-01 DIAGNOSIS — N186 End stage renal disease: Secondary | ICD-10-CM | POA: Diagnosis not present

## 2019-11-02 DIAGNOSIS — D509 Iron deficiency anemia, unspecified: Secondary | ICD-10-CM | POA: Diagnosis not present

## 2019-11-02 DIAGNOSIS — D631 Anemia in chronic kidney disease: Secondary | ICD-10-CM | POA: Diagnosis not present

## 2019-11-02 DIAGNOSIS — N2581 Secondary hyperparathyroidism of renal origin: Secondary | ICD-10-CM | POA: Diagnosis not present

## 2019-11-02 DIAGNOSIS — N186 End stage renal disease: Secondary | ICD-10-CM | POA: Diagnosis not present

## 2019-11-02 DIAGNOSIS — Z418 Encounter for other procedures for purposes other than remedying health state: Secondary | ICD-10-CM | POA: Diagnosis not present

## 2019-11-03 DIAGNOSIS — N186 End stage renal disease: Secondary | ICD-10-CM | POA: Diagnosis not present

## 2019-11-03 DIAGNOSIS — Z992 Dependence on renal dialysis: Secondary | ICD-10-CM | POA: Diagnosis not present

## 2019-11-05 DIAGNOSIS — N2581 Secondary hyperparathyroidism of renal origin: Secondary | ICD-10-CM | POA: Diagnosis not present

## 2019-11-05 DIAGNOSIS — Z23 Encounter for immunization: Secondary | ICD-10-CM | POA: Diagnosis not present

## 2019-11-05 DIAGNOSIS — D509 Iron deficiency anemia, unspecified: Secondary | ICD-10-CM | POA: Diagnosis not present

## 2019-11-05 DIAGNOSIS — Z418 Encounter for other procedures for purposes other than remedying health state: Secondary | ICD-10-CM | POA: Diagnosis not present

## 2019-11-05 DIAGNOSIS — D631 Anemia in chronic kidney disease: Secondary | ICD-10-CM | POA: Diagnosis not present

## 2019-11-05 DIAGNOSIS — N186 End stage renal disease: Secondary | ICD-10-CM | POA: Diagnosis not present

## 2019-11-07 DIAGNOSIS — D509 Iron deficiency anemia, unspecified: Secondary | ICD-10-CM | POA: Diagnosis not present

## 2019-11-07 DIAGNOSIS — N2581 Secondary hyperparathyroidism of renal origin: Secondary | ICD-10-CM | POA: Diagnosis not present

## 2019-11-07 DIAGNOSIS — Z23 Encounter for immunization: Secondary | ICD-10-CM | POA: Diagnosis not present

## 2019-11-07 DIAGNOSIS — Z418 Encounter for other procedures for purposes other than remedying health state: Secondary | ICD-10-CM | POA: Diagnosis not present

## 2019-11-07 DIAGNOSIS — N186 End stage renal disease: Secondary | ICD-10-CM | POA: Diagnosis not present

## 2019-11-07 DIAGNOSIS — D631 Anemia in chronic kidney disease: Secondary | ICD-10-CM | POA: Diagnosis not present

## 2019-11-09 DIAGNOSIS — N2581 Secondary hyperparathyroidism of renal origin: Secondary | ICD-10-CM | POA: Diagnosis not present

## 2019-11-09 DIAGNOSIS — E119 Type 2 diabetes mellitus without complications: Secondary | ICD-10-CM | POA: Diagnosis not present

## 2019-11-09 DIAGNOSIS — D631 Anemia in chronic kidney disease: Secondary | ICD-10-CM | POA: Diagnosis not present

## 2019-11-09 DIAGNOSIS — D509 Iron deficiency anemia, unspecified: Secondary | ICD-10-CM | POA: Diagnosis not present

## 2019-11-09 DIAGNOSIS — Z23 Encounter for immunization: Secondary | ICD-10-CM | POA: Diagnosis not present

## 2019-11-09 DIAGNOSIS — Z418 Encounter for other procedures for purposes other than remedying health state: Secondary | ICD-10-CM | POA: Diagnosis not present

## 2019-11-09 DIAGNOSIS — N186 End stage renal disease: Secondary | ICD-10-CM | POA: Diagnosis not present

## 2019-11-11 DIAGNOSIS — D631 Anemia in chronic kidney disease: Secondary | ICD-10-CM | POA: Diagnosis not present

## 2019-11-11 DIAGNOSIS — N2581 Secondary hyperparathyroidism of renal origin: Secondary | ICD-10-CM | POA: Diagnosis not present

## 2019-11-11 DIAGNOSIS — Z23 Encounter for immunization: Secondary | ICD-10-CM | POA: Diagnosis not present

## 2019-11-11 DIAGNOSIS — N186 End stage renal disease: Secondary | ICD-10-CM | POA: Diagnosis not present

## 2019-11-11 DIAGNOSIS — Z418 Encounter for other procedures for purposes other than remedying health state: Secondary | ICD-10-CM | POA: Diagnosis not present

## 2019-11-11 DIAGNOSIS — D509 Iron deficiency anemia, unspecified: Secondary | ICD-10-CM | POA: Diagnosis not present

## 2019-11-14 DIAGNOSIS — N2581 Secondary hyperparathyroidism of renal origin: Secondary | ICD-10-CM | POA: Diagnosis not present

## 2019-11-14 DIAGNOSIS — D631 Anemia in chronic kidney disease: Secondary | ICD-10-CM | POA: Diagnosis not present

## 2019-11-14 DIAGNOSIS — Z418 Encounter for other procedures for purposes other than remedying health state: Secondary | ICD-10-CM | POA: Diagnosis not present

## 2019-11-14 DIAGNOSIS — D509 Iron deficiency anemia, unspecified: Secondary | ICD-10-CM | POA: Diagnosis not present

## 2019-11-14 DIAGNOSIS — Z23 Encounter for immunization: Secondary | ICD-10-CM | POA: Diagnosis not present

## 2019-11-14 DIAGNOSIS — N186 End stage renal disease: Secondary | ICD-10-CM | POA: Diagnosis not present

## 2019-11-15 DIAGNOSIS — T8249XA Other complication of vascular dialysis catheter, initial encounter: Secondary | ICD-10-CM | POA: Diagnosis not present

## 2019-11-15 DIAGNOSIS — N186 End stage renal disease: Secondary | ICD-10-CM | POA: Diagnosis not present

## 2019-11-16 DIAGNOSIS — N2581 Secondary hyperparathyroidism of renal origin: Secondary | ICD-10-CM | POA: Diagnosis not present

## 2019-11-16 DIAGNOSIS — Z418 Encounter for other procedures for purposes other than remedying health state: Secondary | ICD-10-CM | POA: Diagnosis not present

## 2019-11-16 DIAGNOSIS — D631 Anemia in chronic kidney disease: Secondary | ICD-10-CM | POA: Diagnosis not present

## 2019-11-16 DIAGNOSIS — N186 End stage renal disease: Secondary | ICD-10-CM | POA: Diagnosis not present

## 2019-11-16 DIAGNOSIS — Z23 Encounter for immunization: Secondary | ICD-10-CM | POA: Diagnosis not present

## 2019-11-16 DIAGNOSIS — D509 Iron deficiency anemia, unspecified: Secondary | ICD-10-CM | POA: Diagnosis not present

## 2019-11-18 DIAGNOSIS — D631 Anemia in chronic kidney disease: Secondary | ICD-10-CM | POA: Diagnosis not present

## 2019-11-18 DIAGNOSIS — Z418 Encounter for other procedures for purposes other than remedying health state: Secondary | ICD-10-CM | POA: Diagnosis not present

## 2019-11-18 DIAGNOSIS — N2581 Secondary hyperparathyroidism of renal origin: Secondary | ICD-10-CM | POA: Diagnosis not present

## 2019-11-18 DIAGNOSIS — N186 End stage renal disease: Secondary | ICD-10-CM | POA: Diagnosis not present

## 2019-11-18 DIAGNOSIS — D509 Iron deficiency anemia, unspecified: Secondary | ICD-10-CM | POA: Diagnosis not present

## 2019-11-18 DIAGNOSIS — Z23 Encounter for immunization: Secondary | ICD-10-CM | POA: Diagnosis not present

## 2019-11-21 DIAGNOSIS — Z418 Encounter for other procedures for purposes other than remedying health state: Secondary | ICD-10-CM | POA: Diagnosis not present

## 2019-11-21 DIAGNOSIS — Z23 Encounter for immunization: Secondary | ICD-10-CM | POA: Diagnosis not present

## 2019-11-21 DIAGNOSIS — N2581 Secondary hyperparathyroidism of renal origin: Secondary | ICD-10-CM | POA: Diagnosis not present

## 2019-11-21 DIAGNOSIS — D509 Iron deficiency anemia, unspecified: Secondary | ICD-10-CM | POA: Diagnosis not present

## 2019-11-21 DIAGNOSIS — N186 End stage renal disease: Secondary | ICD-10-CM | POA: Diagnosis not present

## 2019-11-21 DIAGNOSIS — D631 Anemia in chronic kidney disease: Secondary | ICD-10-CM | POA: Diagnosis not present

## 2019-11-23 DIAGNOSIS — Z23 Encounter for immunization: Secondary | ICD-10-CM | POA: Diagnosis not present

## 2019-11-23 DIAGNOSIS — N2581 Secondary hyperparathyroidism of renal origin: Secondary | ICD-10-CM | POA: Diagnosis not present

## 2019-11-23 DIAGNOSIS — Z418 Encounter for other procedures for purposes other than remedying health state: Secondary | ICD-10-CM | POA: Diagnosis not present

## 2019-11-23 DIAGNOSIS — D509 Iron deficiency anemia, unspecified: Secondary | ICD-10-CM | POA: Diagnosis not present

## 2019-11-23 DIAGNOSIS — D631 Anemia in chronic kidney disease: Secondary | ICD-10-CM | POA: Diagnosis not present

## 2019-11-23 DIAGNOSIS — N186 End stage renal disease: Secondary | ICD-10-CM | POA: Diagnosis not present

## 2019-11-24 DIAGNOSIS — N186 End stage renal disease: Secondary | ICD-10-CM | POA: Diagnosis not present

## 2019-11-24 DIAGNOSIS — Z992 Dependence on renal dialysis: Secondary | ICD-10-CM | POA: Diagnosis not present

## 2019-11-26 DIAGNOSIS — Z418 Encounter for other procedures for purposes other than remedying health state: Secondary | ICD-10-CM | POA: Diagnosis not present

## 2019-11-26 DIAGNOSIS — N2581 Secondary hyperparathyroidism of renal origin: Secondary | ICD-10-CM | POA: Diagnosis not present

## 2019-11-26 DIAGNOSIS — E559 Vitamin D deficiency, unspecified: Secondary | ICD-10-CM | POA: Diagnosis not present

## 2019-11-26 DIAGNOSIS — D631 Anemia in chronic kidney disease: Secondary | ICD-10-CM | POA: Diagnosis not present

## 2019-11-26 DIAGNOSIS — D509 Iron deficiency anemia, unspecified: Secondary | ICD-10-CM | POA: Diagnosis not present

## 2019-11-26 DIAGNOSIS — I10 Essential (primary) hypertension: Secondary | ICD-10-CM | POA: Diagnosis not present

## 2019-11-26 DIAGNOSIS — N186 End stage renal disease: Secondary | ICD-10-CM | POA: Diagnosis not present

## 2019-11-26 DIAGNOSIS — Z992 Dependence on renal dialysis: Secondary | ICD-10-CM | POA: Diagnosis not present

## 2019-11-26 DIAGNOSIS — Z23 Encounter for immunization: Secondary | ICD-10-CM | POA: Diagnosis not present

## 2019-11-28 DIAGNOSIS — F419 Anxiety disorder, unspecified: Secondary | ICD-10-CM | POA: Diagnosis not present

## 2019-11-28 DIAGNOSIS — I454 Nonspecific intraventricular block: Secondary | ICD-10-CM | POA: Diagnosis not present

## 2019-11-28 DIAGNOSIS — Z7901 Long term (current) use of anticoagulants: Secondary | ICD-10-CM | POA: Diagnosis not present

## 2019-11-28 DIAGNOSIS — Z881 Allergy status to other antibiotic agents status: Secondary | ICD-10-CM | POA: Diagnosis not present

## 2019-11-28 DIAGNOSIS — N186 End stage renal disease: Secondary | ICD-10-CM | POA: Diagnosis not present

## 2019-11-28 DIAGNOSIS — E1122 Type 2 diabetes mellitus with diabetic chronic kidney disease: Secondary | ICD-10-CM | POA: Diagnosis not present

## 2019-11-28 DIAGNOSIS — I120 Hypertensive chronic kidney disease with stage 5 chronic kidney disease or end stage renal disease: Secondary | ICD-10-CM | POA: Diagnosis not present

## 2019-11-28 DIAGNOSIS — T82898A Other specified complication of vascular prosthetic devices, implants and grafts, initial encounter: Secondary | ICD-10-CM | POA: Diagnosis not present

## 2019-11-28 DIAGNOSIS — Z992 Dependence on renal dialysis: Secondary | ICD-10-CM | POA: Diagnosis not present

## 2019-11-28 DIAGNOSIS — M1990 Unspecified osteoarthritis, unspecified site: Secondary | ICD-10-CM | POA: Diagnosis not present

## 2019-11-29 DIAGNOSIS — D509 Iron deficiency anemia, unspecified: Secondary | ICD-10-CM | POA: Diagnosis not present

## 2019-11-29 DIAGNOSIS — Z23 Encounter for immunization: Secondary | ICD-10-CM | POA: Diagnosis not present

## 2019-11-29 DIAGNOSIS — N186 End stage renal disease: Secondary | ICD-10-CM | POA: Diagnosis not present

## 2019-11-29 DIAGNOSIS — Z418 Encounter for other procedures for purposes other than remedying health state: Secondary | ICD-10-CM | POA: Diagnosis not present

## 2019-11-29 DIAGNOSIS — N2581 Secondary hyperparathyroidism of renal origin: Secondary | ICD-10-CM | POA: Diagnosis not present

## 2019-11-29 DIAGNOSIS — D631 Anemia in chronic kidney disease: Secondary | ICD-10-CM | POA: Diagnosis not present

## 2019-11-30 DIAGNOSIS — D631 Anemia in chronic kidney disease: Secondary | ICD-10-CM | POA: Diagnosis not present

## 2019-11-30 DIAGNOSIS — Z23 Encounter for immunization: Secondary | ICD-10-CM | POA: Diagnosis not present

## 2019-11-30 DIAGNOSIS — N186 End stage renal disease: Secondary | ICD-10-CM | POA: Diagnosis not present

## 2019-11-30 DIAGNOSIS — D509 Iron deficiency anemia, unspecified: Secondary | ICD-10-CM | POA: Diagnosis not present

## 2019-11-30 DIAGNOSIS — Z418 Encounter for other procedures for purposes other than remedying health state: Secondary | ICD-10-CM | POA: Diagnosis not present

## 2019-11-30 DIAGNOSIS — N2581 Secondary hyperparathyroidism of renal origin: Secondary | ICD-10-CM | POA: Diagnosis not present

## 2019-12-02 DIAGNOSIS — D631 Anemia in chronic kidney disease: Secondary | ICD-10-CM | POA: Diagnosis not present

## 2019-12-02 DIAGNOSIS — D509 Iron deficiency anemia, unspecified: Secondary | ICD-10-CM | POA: Diagnosis not present

## 2019-12-02 DIAGNOSIS — Z23 Encounter for immunization: Secondary | ICD-10-CM | POA: Diagnosis not present

## 2019-12-02 DIAGNOSIS — Z418 Encounter for other procedures for purposes other than remedying health state: Secondary | ICD-10-CM | POA: Diagnosis not present

## 2019-12-02 DIAGNOSIS — N186 End stage renal disease: Secondary | ICD-10-CM | POA: Diagnosis not present

## 2019-12-02 DIAGNOSIS — N2581 Secondary hyperparathyroidism of renal origin: Secondary | ICD-10-CM | POA: Diagnosis not present

## 2019-12-04 DIAGNOSIS — N186 End stage renal disease: Secondary | ICD-10-CM | POA: Diagnosis not present

## 2019-12-04 DIAGNOSIS — Z992 Dependence on renal dialysis: Secondary | ICD-10-CM | POA: Diagnosis not present

## 2019-12-05 DIAGNOSIS — D631 Anemia in chronic kidney disease: Secondary | ICD-10-CM | POA: Diagnosis not present

## 2019-12-05 DIAGNOSIS — N186 End stage renal disease: Secondary | ICD-10-CM | POA: Diagnosis not present

## 2019-12-05 DIAGNOSIS — N2581 Secondary hyperparathyroidism of renal origin: Secondary | ICD-10-CM | POA: Diagnosis not present

## 2019-12-05 DIAGNOSIS — D509 Iron deficiency anemia, unspecified: Secondary | ICD-10-CM | POA: Diagnosis not present

## 2019-12-08 DIAGNOSIS — N2581 Secondary hyperparathyroidism of renal origin: Secondary | ICD-10-CM | POA: Diagnosis not present

## 2019-12-08 DIAGNOSIS — D509 Iron deficiency anemia, unspecified: Secondary | ICD-10-CM | POA: Diagnosis not present

## 2019-12-08 DIAGNOSIS — N186 End stage renal disease: Secondary | ICD-10-CM | POA: Diagnosis not present

## 2019-12-08 DIAGNOSIS — D631 Anemia in chronic kidney disease: Secondary | ICD-10-CM | POA: Diagnosis not present

## 2019-12-08 DIAGNOSIS — E119 Type 2 diabetes mellitus without complications: Secondary | ICD-10-CM | POA: Diagnosis not present

## 2019-12-09 DIAGNOSIS — D509 Iron deficiency anemia, unspecified: Secondary | ICD-10-CM | POA: Diagnosis not present

## 2019-12-09 DIAGNOSIS — N2581 Secondary hyperparathyroidism of renal origin: Secondary | ICD-10-CM | POA: Diagnosis not present

## 2019-12-09 DIAGNOSIS — N186 End stage renal disease: Secondary | ICD-10-CM | POA: Diagnosis not present

## 2019-12-09 DIAGNOSIS — D631 Anemia in chronic kidney disease: Secondary | ICD-10-CM | POA: Diagnosis not present

## 2019-12-12 DIAGNOSIS — N186 End stage renal disease: Secondary | ICD-10-CM | POA: Diagnosis not present

## 2019-12-12 DIAGNOSIS — D631 Anemia in chronic kidney disease: Secondary | ICD-10-CM | POA: Diagnosis not present

## 2019-12-12 DIAGNOSIS — D509 Iron deficiency anemia, unspecified: Secondary | ICD-10-CM | POA: Diagnosis not present

## 2019-12-12 DIAGNOSIS — N2581 Secondary hyperparathyroidism of renal origin: Secondary | ICD-10-CM | POA: Diagnosis not present

## 2019-12-15 DIAGNOSIS — N186 End stage renal disease: Secondary | ICD-10-CM | POA: Diagnosis not present

## 2019-12-15 DIAGNOSIS — E8779 Other fluid overload: Secondary | ICD-10-CM | POA: Diagnosis not present

## 2019-12-16 DIAGNOSIS — N186 End stage renal disease: Secondary | ICD-10-CM | POA: Diagnosis not present

## 2019-12-16 DIAGNOSIS — N2581 Secondary hyperparathyroidism of renal origin: Secondary | ICD-10-CM | POA: Diagnosis not present

## 2019-12-16 DIAGNOSIS — D631 Anemia in chronic kidney disease: Secondary | ICD-10-CM | POA: Diagnosis not present

## 2019-12-16 DIAGNOSIS — D509 Iron deficiency anemia, unspecified: Secondary | ICD-10-CM | POA: Diagnosis not present

## 2019-12-19 DIAGNOSIS — D509 Iron deficiency anemia, unspecified: Secondary | ICD-10-CM | POA: Diagnosis not present

## 2019-12-19 DIAGNOSIS — N2581 Secondary hyperparathyroidism of renal origin: Secondary | ICD-10-CM | POA: Diagnosis not present

## 2019-12-19 DIAGNOSIS — N186 End stage renal disease: Secondary | ICD-10-CM | POA: Diagnosis not present

## 2019-12-19 DIAGNOSIS — D631 Anemia in chronic kidney disease: Secondary | ICD-10-CM | POA: Diagnosis not present

## 2019-12-21 DIAGNOSIS — N2581 Secondary hyperparathyroidism of renal origin: Secondary | ICD-10-CM | POA: Diagnosis not present

## 2019-12-21 DIAGNOSIS — D509 Iron deficiency anemia, unspecified: Secondary | ICD-10-CM | POA: Diagnosis not present

## 2019-12-21 DIAGNOSIS — D631 Anemia in chronic kidney disease: Secondary | ICD-10-CM | POA: Diagnosis not present

## 2019-12-21 DIAGNOSIS — N186 End stage renal disease: Secondary | ICD-10-CM | POA: Diagnosis not present

## 2019-12-23 DIAGNOSIS — N186 End stage renal disease: Secondary | ICD-10-CM | POA: Diagnosis not present

## 2019-12-23 DIAGNOSIS — D509 Iron deficiency anemia, unspecified: Secondary | ICD-10-CM | POA: Diagnosis not present

## 2019-12-23 DIAGNOSIS — N2581 Secondary hyperparathyroidism of renal origin: Secondary | ICD-10-CM | POA: Diagnosis not present

## 2019-12-23 DIAGNOSIS — D631 Anemia in chronic kidney disease: Secondary | ICD-10-CM | POA: Diagnosis not present

## 2019-12-25 DIAGNOSIS — N186 End stage renal disease: Secondary | ICD-10-CM | POA: Diagnosis not present

## 2019-12-25 DIAGNOSIS — D509 Iron deficiency anemia, unspecified: Secondary | ICD-10-CM | POA: Diagnosis not present

## 2019-12-25 DIAGNOSIS — N2581 Secondary hyperparathyroidism of renal origin: Secondary | ICD-10-CM | POA: Diagnosis not present

## 2019-12-25 DIAGNOSIS — D631 Anemia in chronic kidney disease: Secondary | ICD-10-CM | POA: Diagnosis not present

## 2019-12-28 DIAGNOSIS — D631 Anemia in chronic kidney disease: Secondary | ICD-10-CM | POA: Diagnosis not present

## 2019-12-28 DIAGNOSIS — N2581 Secondary hyperparathyroidism of renal origin: Secondary | ICD-10-CM | POA: Diagnosis not present

## 2019-12-28 DIAGNOSIS — N186 End stage renal disease: Secondary | ICD-10-CM | POA: Diagnosis not present

## 2019-12-28 DIAGNOSIS — D509 Iron deficiency anemia, unspecified: Secondary | ICD-10-CM | POA: Diagnosis not present

## 2019-12-31 DIAGNOSIS — D631 Anemia in chronic kidney disease: Secondary | ICD-10-CM | POA: Diagnosis not present

## 2019-12-31 DIAGNOSIS — D509 Iron deficiency anemia, unspecified: Secondary | ICD-10-CM | POA: Diagnosis not present

## 2019-12-31 DIAGNOSIS — N2581 Secondary hyperparathyroidism of renal origin: Secondary | ICD-10-CM | POA: Diagnosis not present

## 2019-12-31 DIAGNOSIS — N186 End stage renal disease: Secondary | ICD-10-CM | POA: Diagnosis not present

## 2020-01-03 DIAGNOSIS — Z992 Dependence on renal dialysis: Secondary | ICD-10-CM | POA: Diagnosis not present

## 2020-01-03 DIAGNOSIS — D509 Iron deficiency anemia, unspecified: Secondary | ICD-10-CM | POA: Diagnosis not present

## 2020-01-03 DIAGNOSIS — N2581 Secondary hyperparathyroidism of renal origin: Secondary | ICD-10-CM | POA: Diagnosis not present

## 2020-01-03 DIAGNOSIS — D631 Anemia in chronic kidney disease: Secondary | ICD-10-CM | POA: Diagnosis not present

## 2020-01-03 DIAGNOSIS — N186 End stage renal disease: Secondary | ICD-10-CM | POA: Diagnosis not present

## 2020-01-04 DIAGNOSIS — D631 Anemia in chronic kidney disease: Secondary | ICD-10-CM | POA: Diagnosis not present

## 2020-01-04 DIAGNOSIS — N186 End stage renal disease: Secondary | ICD-10-CM | POA: Diagnosis not present

## 2020-01-04 DIAGNOSIS — D509 Iron deficiency anemia, unspecified: Secondary | ICD-10-CM | POA: Diagnosis not present

## 2020-01-04 DIAGNOSIS — N2581 Secondary hyperparathyroidism of renal origin: Secondary | ICD-10-CM | POA: Diagnosis not present

## 2020-01-05 DIAGNOSIS — Z48812 Encounter for surgical aftercare following surgery on the circulatory system: Secondary | ICD-10-CM | POA: Diagnosis not present

## 2020-01-05 DIAGNOSIS — N186 End stage renal disease: Secondary | ICD-10-CM | POA: Diagnosis not present

## 2020-01-06 DIAGNOSIS — D631 Anemia in chronic kidney disease: Secondary | ICD-10-CM | POA: Diagnosis not present

## 2020-01-06 DIAGNOSIS — D509 Iron deficiency anemia, unspecified: Secondary | ICD-10-CM | POA: Diagnosis not present

## 2020-01-06 DIAGNOSIS — M79604 Pain in right leg: Secondary | ICD-10-CM | POA: Diagnosis not present

## 2020-01-06 DIAGNOSIS — Z992 Dependence on renal dialysis: Secondary | ICD-10-CM | POA: Diagnosis not present

## 2020-01-06 DIAGNOSIS — E875 Hyperkalemia: Secondary | ICD-10-CM | POA: Diagnosis not present

## 2020-01-06 DIAGNOSIS — M25562 Pain in left knee: Secondary | ICD-10-CM | POA: Diagnosis not present

## 2020-01-06 DIAGNOSIS — N186 End stage renal disease: Secondary | ICD-10-CM | POA: Diagnosis not present

## 2020-01-06 DIAGNOSIS — N2581 Secondary hyperparathyroidism of renal origin: Secondary | ICD-10-CM | POA: Diagnosis not present

## 2020-01-09 DIAGNOSIS — N186 End stage renal disease: Secondary | ICD-10-CM | POA: Diagnosis not present

## 2020-01-09 DIAGNOSIS — N2581 Secondary hyperparathyroidism of renal origin: Secondary | ICD-10-CM | POA: Diagnosis not present

## 2020-01-09 DIAGNOSIS — D631 Anemia in chronic kidney disease: Secondary | ICD-10-CM | POA: Diagnosis not present

## 2020-01-09 DIAGNOSIS — Z992 Dependence on renal dialysis: Secondary | ICD-10-CM | POA: Diagnosis not present

## 2020-01-09 DIAGNOSIS — Z6836 Body mass index (BMI) 36.0-36.9, adult: Secondary | ICD-10-CM | POA: Diagnosis not present

## 2020-01-09 DIAGNOSIS — E119 Type 2 diabetes mellitus without complications: Secondary | ICD-10-CM | POA: Diagnosis not present

## 2020-01-09 DIAGNOSIS — D509 Iron deficiency anemia, unspecified: Secondary | ICD-10-CM | POA: Diagnosis not present

## 2020-01-09 DIAGNOSIS — Z114 Encounter for screening for human immunodeficiency virus [HIV]: Secondary | ICD-10-CM | POA: Diagnosis not present

## 2020-01-10 DIAGNOSIS — Z23 Encounter for immunization: Secondary | ICD-10-CM | POA: Diagnosis not present

## 2020-01-12 DIAGNOSIS — N186 End stage renal disease: Secondary | ICD-10-CM | POA: Diagnosis not present

## 2020-01-12 DIAGNOSIS — D509 Iron deficiency anemia, unspecified: Secondary | ICD-10-CM | POA: Diagnosis not present

## 2020-01-12 DIAGNOSIS — D631 Anemia in chronic kidney disease: Secondary | ICD-10-CM | POA: Diagnosis not present

## 2020-01-12 DIAGNOSIS — N2581 Secondary hyperparathyroidism of renal origin: Secondary | ICD-10-CM | POA: Diagnosis not present

## 2020-01-13 DIAGNOSIS — D631 Anemia in chronic kidney disease: Secondary | ICD-10-CM | POA: Diagnosis not present

## 2020-01-13 DIAGNOSIS — N2581 Secondary hyperparathyroidism of renal origin: Secondary | ICD-10-CM | POA: Diagnosis not present

## 2020-01-13 DIAGNOSIS — N186 End stage renal disease: Secondary | ICD-10-CM | POA: Diagnosis not present

## 2020-01-13 DIAGNOSIS — D509 Iron deficiency anemia, unspecified: Secondary | ICD-10-CM | POA: Diagnosis not present

## 2020-01-13 IMAGING — CR PORTABLE ABDOMEN - 2 VIEW
1 series · 3 of 3 positions shown · non-contrast
Comparison: None.

CLINICAL DATA: Shortness of breath

EXAM:
PORTABLE ABDOMEN - 2 VIEW

[Series 1: supine ap · 0.17mm/px · 3 of 3 slices shown]
[im 1/3]
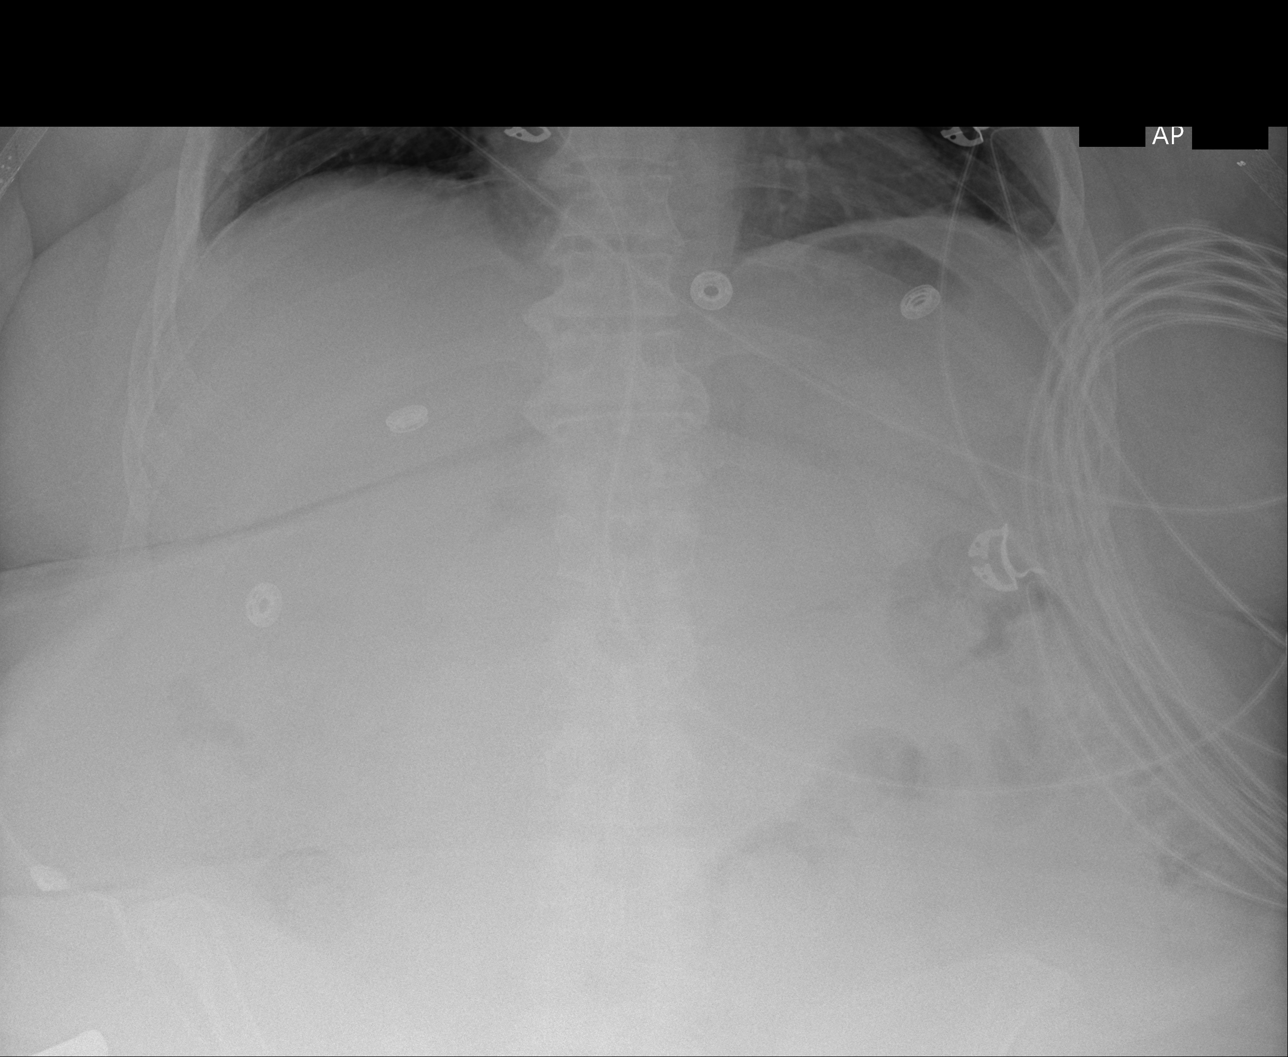
[im 2/3]
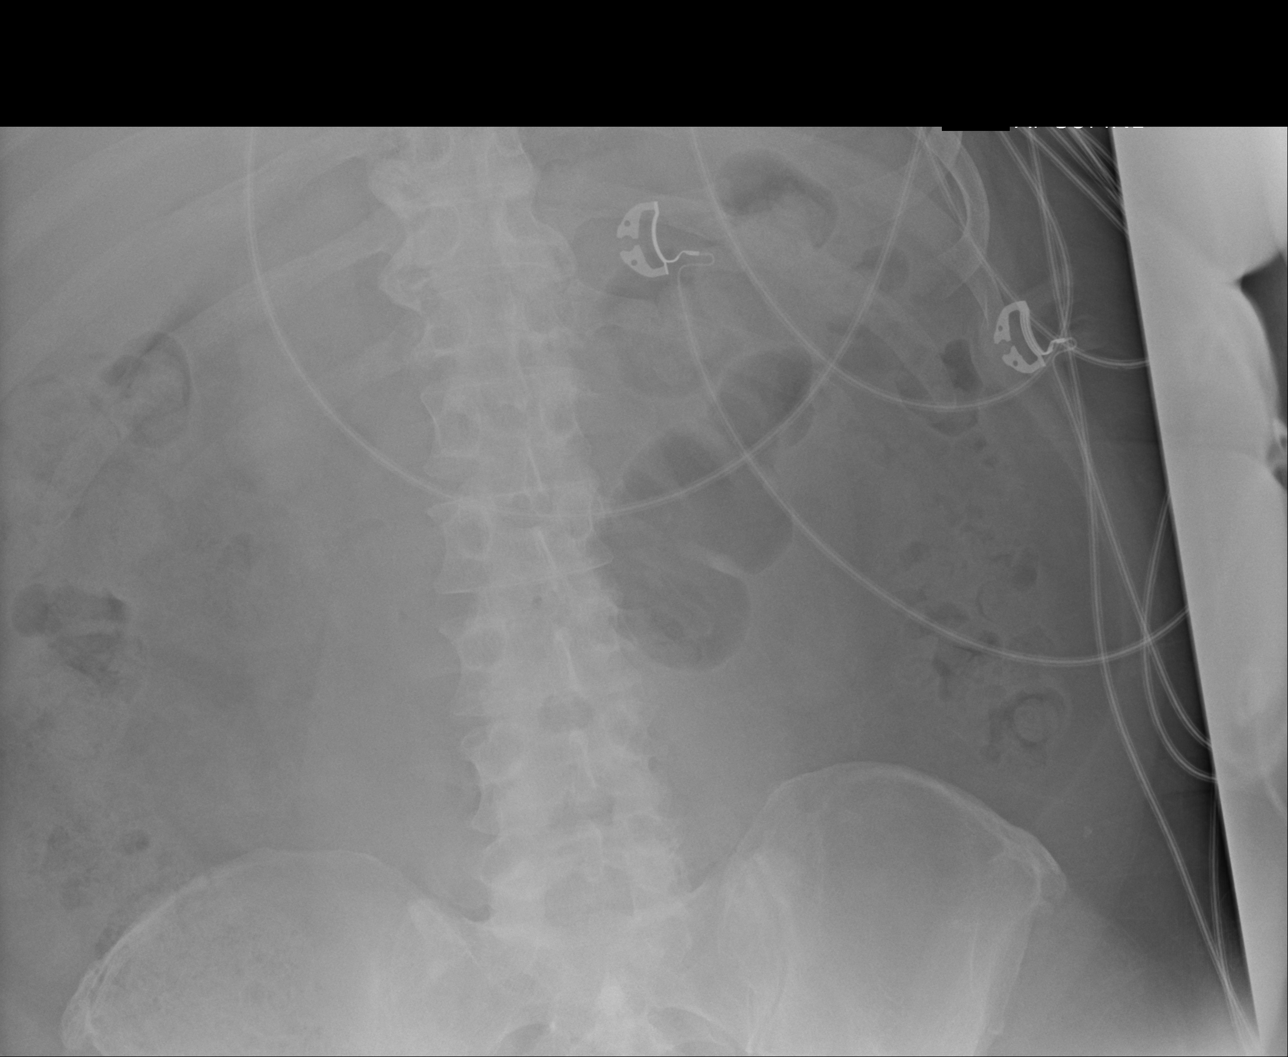
[im 3/3]
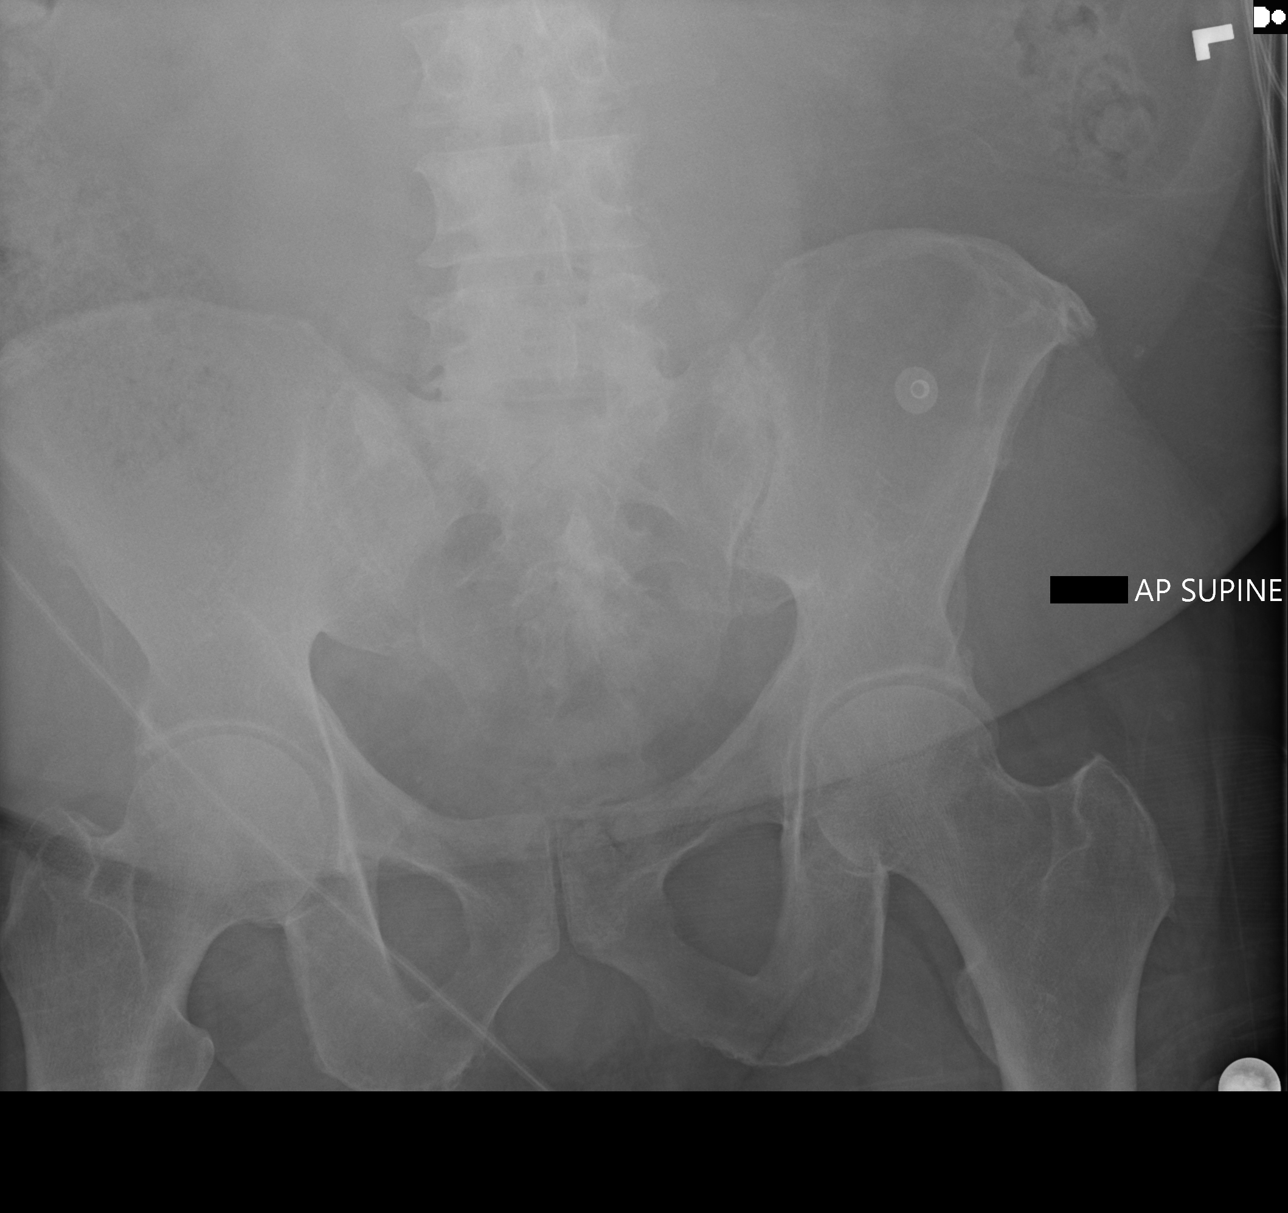

[3 of 3 positions shown; findings below may reference images not displayed]

FINDINGS: Scattered large and small bowel gas is noted. No free air is noted.
No obstructive changes are seen. Mild constipation is noted. No bony
abnormality is noted.
IMPRESSION: Mild constipation.

## 2020-01-13 IMAGING — CR PORTABLE CHEST - 1 VIEW
1 series · 1 of 1 positions shown · non-contrast
Comparison: 11/01/15

CLINICAL DATA: Nausea and vomiting

EXAM:
PORTABLE CHEST 1 VIEW

[portable]
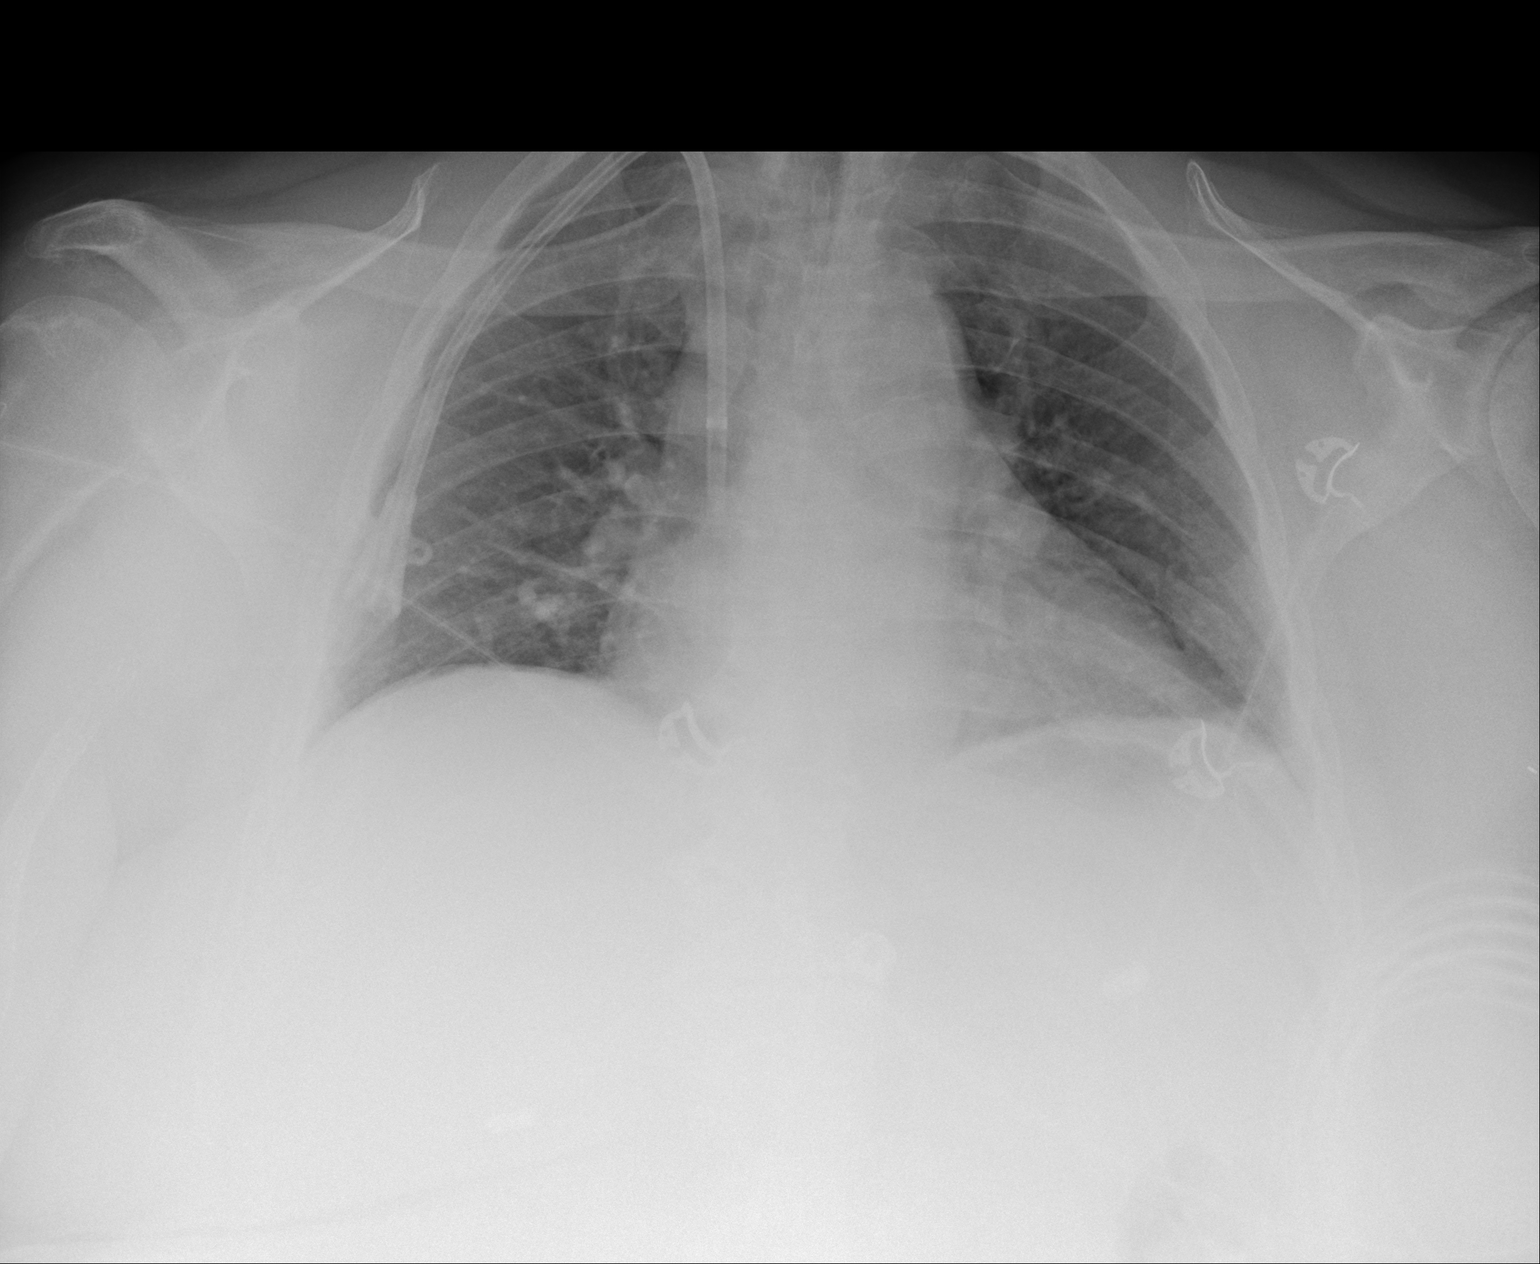

[1 of 1 positions shown; findings below may reference images not displayed]

FINDINGS: Cardiac shadow is mildly prominent but stable. Right-sided jugular
dialysis catheter is noted in satisfactory position. The lungs are
clear bilaterally. No bony abnormality is noted.
IMPRESSION: No acute abnormality noted.

## 2020-01-16 DIAGNOSIS — N2581 Secondary hyperparathyroidism of renal origin: Secondary | ICD-10-CM | POA: Diagnosis not present

## 2020-01-16 DIAGNOSIS — N186 End stage renal disease: Secondary | ICD-10-CM | POA: Diagnosis not present

## 2020-01-16 DIAGNOSIS — D509 Iron deficiency anemia, unspecified: Secondary | ICD-10-CM | POA: Diagnosis not present

## 2020-01-16 DIAGNOSIS — D631 Anemia in chronic kidney disease: Secondary | ICD-10-CM | POA: Diagnosis not present

## 2020-01-18 DIAGNOSIS — D631 Anemia in chronic kidney disease: Secondary | ICD-10-CM | POA: Diagnosis not present

## 2020-01-18 DIAGNOSIS — D509 Iron deficiency anemia, unspecified: Secondary | ICD-10-CM | POA: Diagnosis not present

## 2020-01-18 DIAGNOSIS — N2581 Secondary hyperparathyroidism of renal origin: Secondary | ICD-10-CM | POA: Diagnosis not present

## 2020-01-18 DIAGNOSIS — N186 End stage renal disease: Secondary | ICD-10-CM | POA: Diagnosis not present

## 2020-01-19 DIAGNOSIS — I120 Hypertensive chronic kidney disease with stage 5 chronic kidney disease or end stage renal disease: Secondary | ICD-10-CM | POA: Diagnosis not present

## 2020-01-19 DIAGNOSIS — N186 End stage renal disease: Secondary | ICD-10-CM | POA: Diagnosis not present

## 2020-01-19 DIAGNOSIS — I4510 Unspecified right bundle-branch block: Secondary | ICD-10-CM | POA: Diagnosis not present

## 2020-01-19 DIAGNOSIS — I10 Essential (primary) hypertension: Secondary | ICD-10-CM | POA: Diagnosis not present

## 2020-01-21 DIAGNOSIS — N2581 Secondary hyperparathyroidism of renal origin: Secondary | ICD-10-CM | POA: Diagnosis not present

## 2020-01-21 DIAGNOSIS — D509 Iron deficiency anemia, unspecified: Secondary | ICD-10-CM | POA: Diagnosis not present

## 2020-01-21 DIAGNOSIS — N186 End stage renal disease: Secondary | ICD-10-CM | POA: Diagnosis not present

## 2020-01-21 DIAGNOSIS — D631 Anemia in chronic kidney disease: Secondary | ICD-10-CM | POA: Diagnosis not present

## 2020-01-24 DIAGNOSIS — D509 Iron deficiency anemia, unspecified: Secondary | ICD-10-CM | POA: Diagnosis not present

## 2020-01-24 DIAGNOSIS — N2581 Secondary hyperparathyroidism of renal origin: Secondary | ICD-10-CM | POA: Diagnosis not present

## 2020-01-24 DIAGNOSIS — N186 End stage renal disease: Secondary | ICD-10-CM | POA: Diagnosis not present

## 2020-01-24 DIAGNOSIS — D631 Anemia in chronic kidney disease: Secondary | ICD-10-CM | POA: Diagnosis not present

## 2020-01-25 DIAGNOSIS — N186 End stage renal disease: Secondary | ICD-10-CM | POA: Diagnosis not present

## 2020-01-25 DIAGNOSIS — D631 Anemia in chronic kidney disease: Secondary | ICD-10-CM | POA: Diagnosis not present

## 2020-01-25 DIAGNOSIS — N2581 Secondary hyperparathyroidism of renal origin: Secondary | ICD-10-CM | POA: Diagnosis not present

## 2020-01-25 DIAGNOSIS — D509 Iron deficiency anemia, unspecified: Secondary | ICD-10-CM | POA: Diagnosis not present

## 2020-01-27 DIAGNOSIS — D509 Iron deficiency anemia, unspecified: Secondary | ICD-10-CM | POA: Diagnosis not present

## 2020-01-27 DIAGNOSIS — D631 Anemia in chronic kidney disease: Secondary | ICD-10-CM | POA: Diagnosis not present

## 2020-01-27 DIAGNOSIS — N2581 Secondary hyperparathyroidism of renal origin: Secondary | ICD-10-CM | POA: Diagnosis not present

## 2020-01-27 DIAGNOSIS — N186 End stage renal disease: Secondary | ICD-10-CM | POA: Diagnosis not present

## 2020-01-29 DIAGNOSIS — Z6835 Body mass index (BMI) 35.0-35.9, adult: Secondary | ICD-10-CM | POA: Diagnosis not present

## 2020-01-29 DIAGNOSIS — N529 Male erectile dysfunction, unspecified: Secondary | ICD-10-CM | POA: Diagnosis not present

## 2020-01-29 DIAGNOSIS — Z20822 Contact with and (suspected) exposure to covid-19: Secondary | ICD-10-CM | POA: Diagnosis not present

## 2020-01-29 DIAGNOSIS — L739 Follicular disorder, unspecified: Secondary | ICD-10-CM | POA: Diagnosis not present

## 2020-01-30 DIAGNOSIS — D509 Iron deficiency anemia, unspecified: Secondary | ICD-10-CM | POA: Diagnosis not present

## 2020-01-30 DIAGNOSIS — D631 Anemia in chronic kidney disease: Secondary | ICD-10-CM | POA: Diagnosis not present

## 2020-01-30 DIAGNOSIS — N186 End stage renal disease: Secondary | ICD-10-CM | POA: Diagnosis not present

## 2020-01-30 DIAGNOSIS — N2581 Secondary hyperparathyroidism of renal origin: Secondary | ICD-10-CM | POA: Diagnosis not present

## 2020-02-01 DIAGNOSIS — N2581 Secondary hyperparathyroidism of renal origin: Secondary | ICD-10-CM | POA: Diagnosis not present

## 2020-02-01 DIAGNOSIS — D509 Iron deficiency anemia, unspecified: Secondary | ICD-10-CM | POA: Diagnosis not present

## 2020-02-01 DIAGNOSIS — D631 Anemia in chronic kidney disease: Secondary | ICD-10-CM | POA: Diagnosis not present

## 2020-02-01 DIAGNOSIS — N186 End stage renal disease: Secondary | ICD-10-CM | POA: Diagnosis not present

## 2020-02-02 DIAGNOSIS — Z992 Dependence on renal dialysis: Secondary | ICD-10-CM | POA: Diagnosis not present

## 2020-02-02 DIAGNOSIS — N186 End stage renal disease: Secondary | ICD-10-CM | POA: Diagnosis not present

## 2020-02-02 DIAGNOSIS — N2581 Secondary hyperparathyroidism of renal origin: Secondary | ICD-10-CM | POA: Diagnosis not present

## 2020-02-03 DIAGNOSIS — M109 Gout, unspecified: Secondary | ICD-10-CM | POA: Diagnosis not present

## 2020-02-03 DIAGNOSIS — Z794 Long term (current) use of insulin: Secondary | ICD-10-CM | POA: Diagnosis not present

## 2020-02-03 DIAGNOSIS — E1122 Type 2 diabetes mellitus with diabetic chronic kidney disease: Secondary | ICD-10-CM | POA: Diagnosis not present

## 2020-02-03 DIAGNOSIS — Z992 Dependence on renal dialysis: Secondary | ICD-10-CM | POA: Diagnosis not present

## 2020-02-03 DIAGNOSIS — N186 End stage renal disease: Secondary | ICD-10-CM | POA: Diagnosis not present

## 2020-02-03 DIAGNOSIS — I454 Nonspecific intraventricular block: Secondary | ICD-10-CM | POA: Diagnosis not present

## 2020-02-03 DIAGNOSIS — N400 Enlarged prostate without lower urinary tract symptoms: Secondary | ICD-10-CM | POA: Diagnosis not present

## 2020-02-03 DIAGNOSIS — B351 Tinea unguium: Secondary | ICD-10-CM | POA: Diagnosis not present

## 2020-02-03 DIAGNOSIS — I824Y9 Acute embolism and thrombosis of unspecified deep veins of unspecified proximal lower extremity: Secondary | ICD-10-CM | POA: Diagnosis not present

## 2020-02-03 DIAGNOSIS — I420 Dilated cardiomyopathy: Secondary | ICD-10-CM | POA: Diagnosis not present

## 2020-02-03 DIAGNOSIS — N250 Renal osteodystrophy: Secondary | ICD-10-CM | POA: Diagnosis not present

## 2020-02-03 DIAGNOSIS — I10 Essential (primary) hypertension: Secondary | ICD-10-CM | POA: Diagnosis not present

## 2020-02-06 IMAGING — US US RENAL
1 series · 14 of 25 positions shown · non-contrast
Comparison: 07/12/2018

CLINICAL DATA: chronic pelvic pain, end-stage renal disease

EXAM:
RENAL / URINARY TRACT ULTRASOUND COMPLETE

[Series 1: us renal · 0.25mm/px · 14 of 95 slices shown]
[im 1/95]
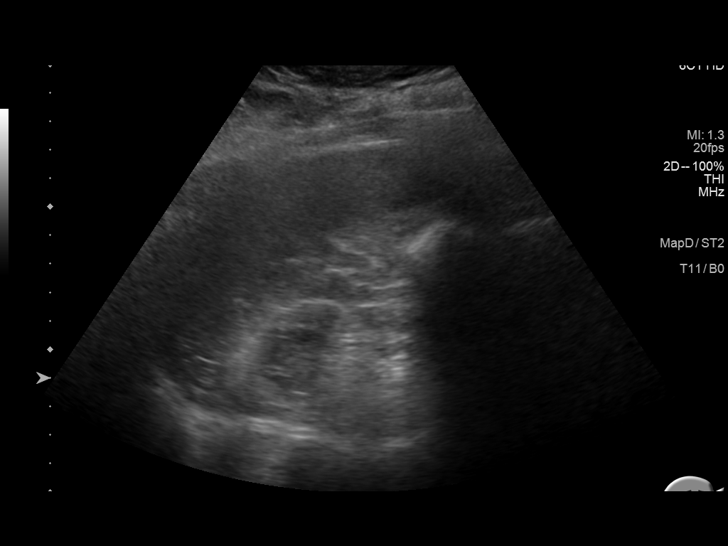
[im 8/95]
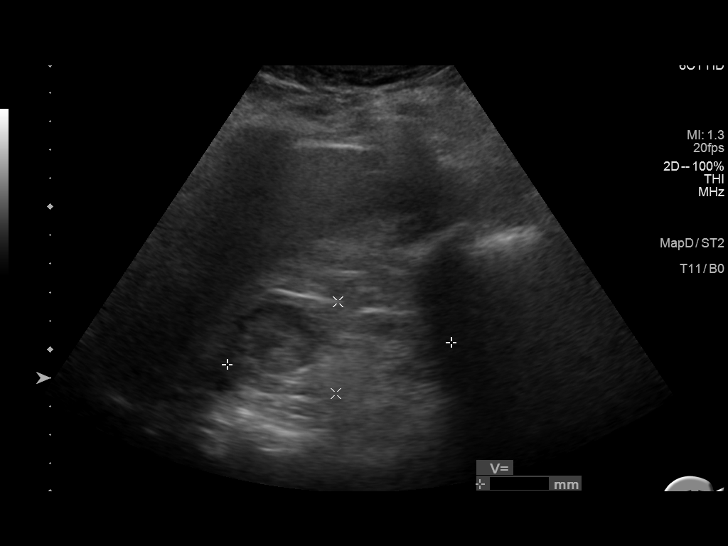
[im 16/95]
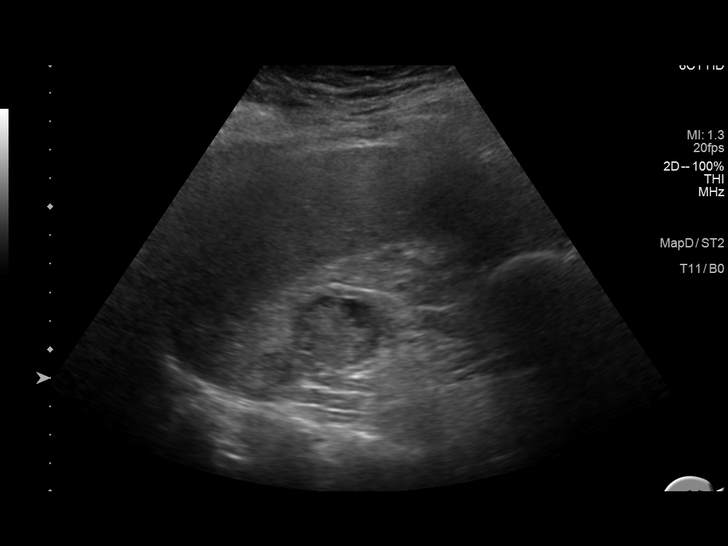
[im 24/95]
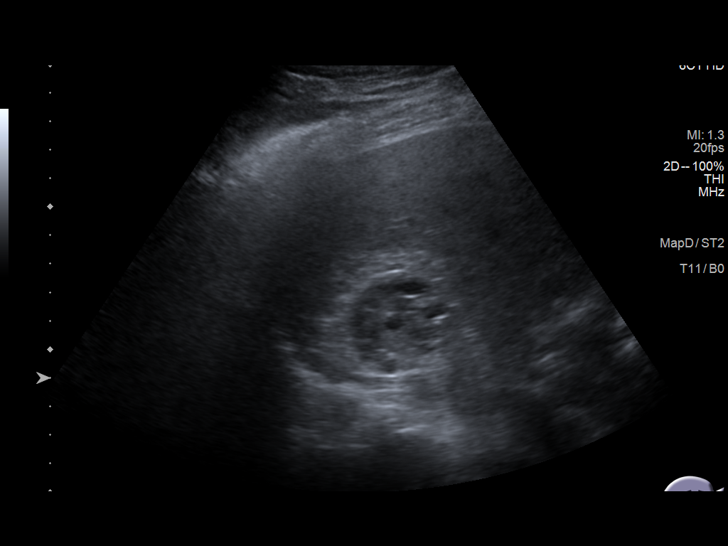
[im 32/95]
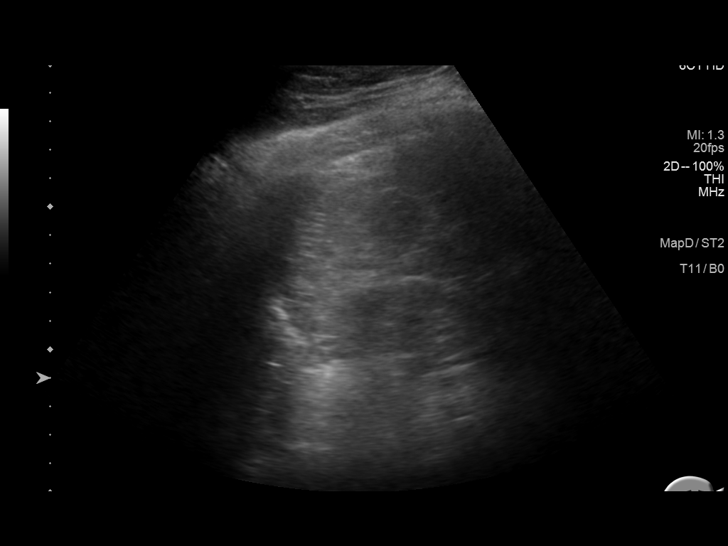
[im 36/95]
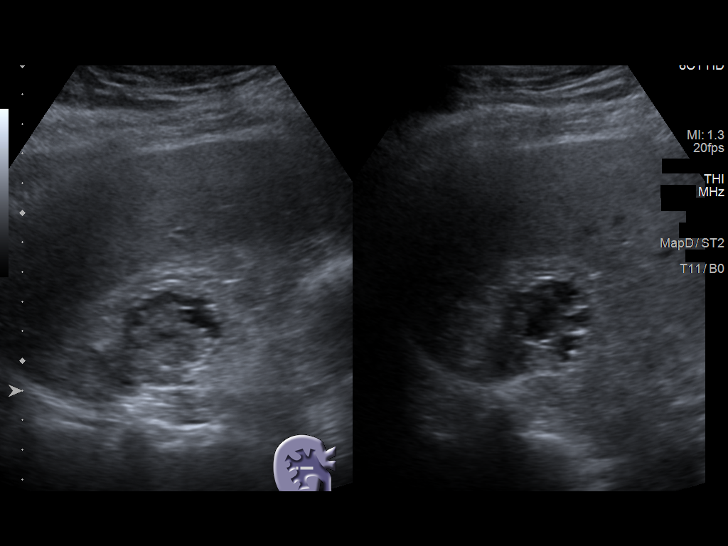
[im 44/95]
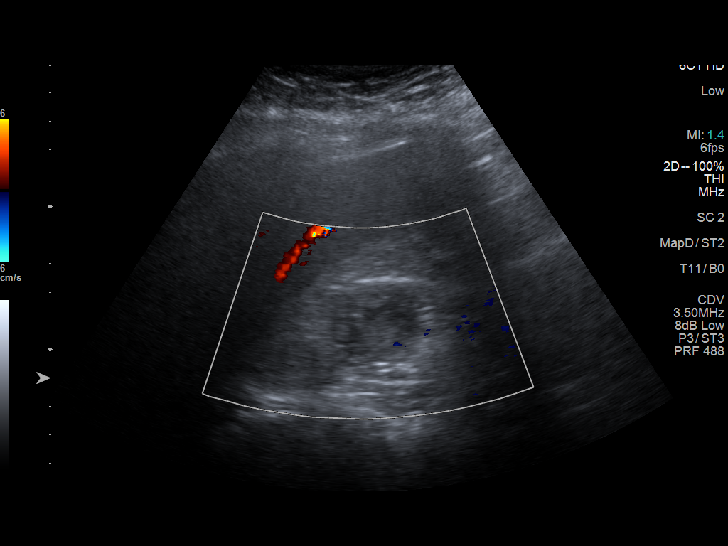
[im 51/95]
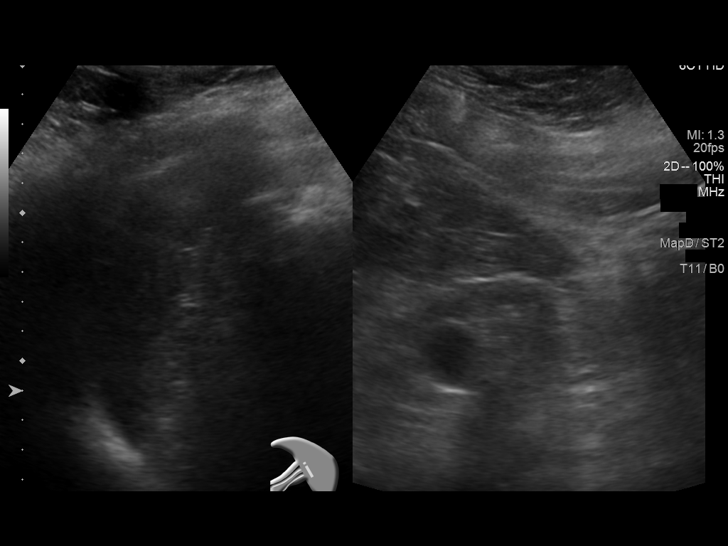
[im 59/95]
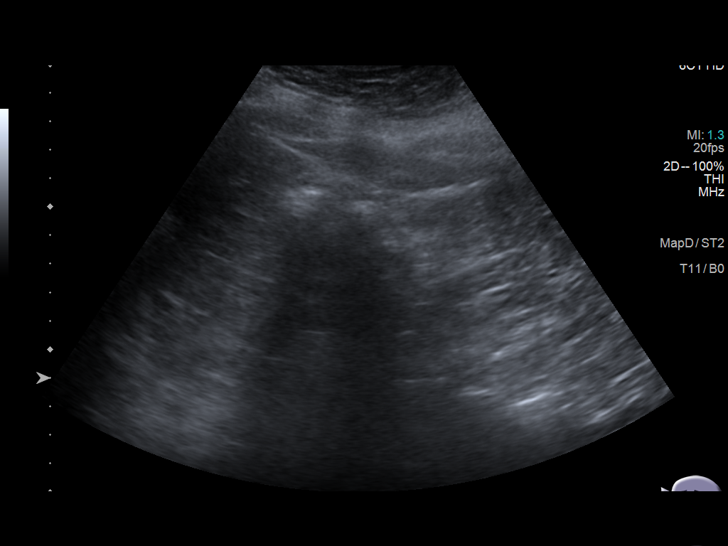
[im 63/95]
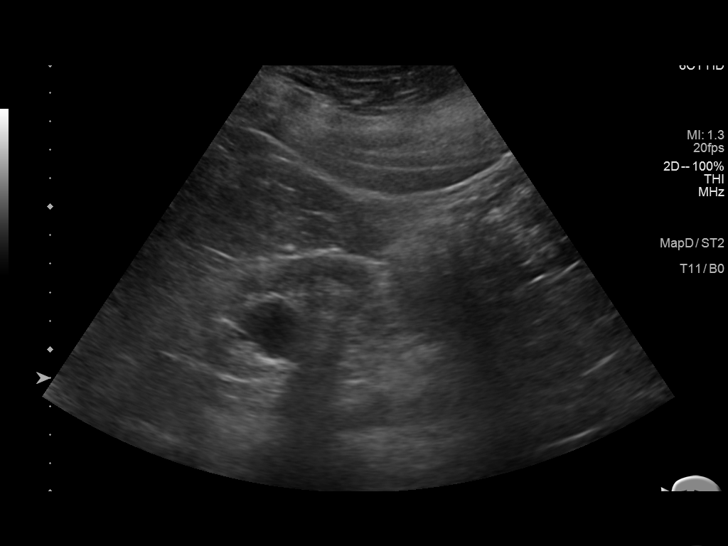
[im 71/95]
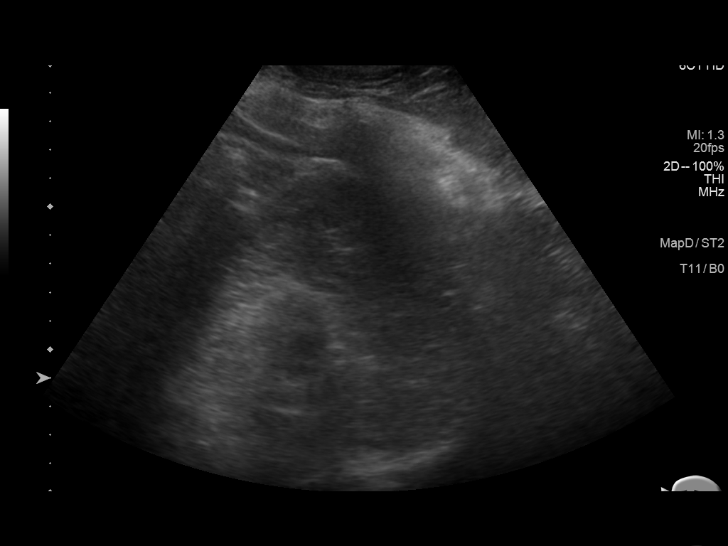
[im 79/95]
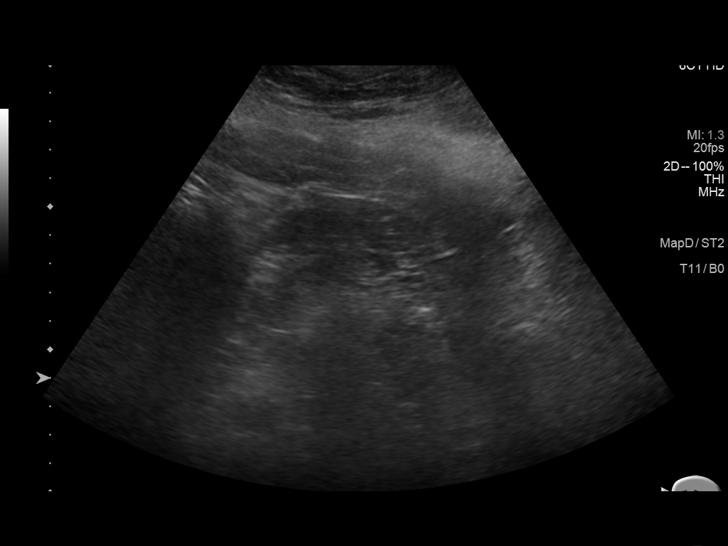
[im 87/95]
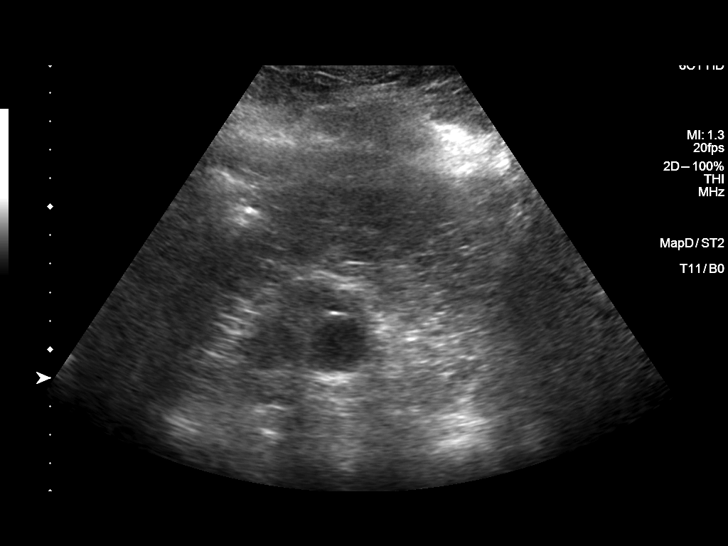
[im 95/95]
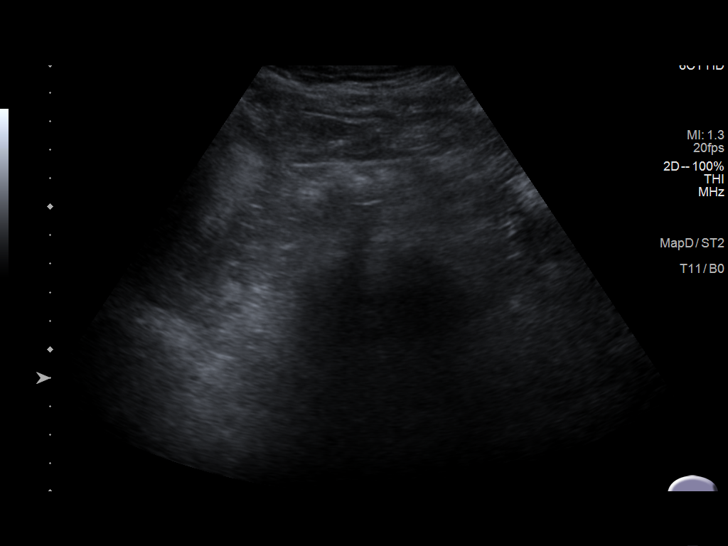

[14 of 25 positions shown; findings below may reference images not displayed]

FINDINGS: Right Kidney:

Renal measurements: 7.9 x 3.2 x 3.5 cm = volume: 46.7 mL. 3.7 cm
complex cystic versus mixed solid and cystic mass in the upper pole.
This measured 3.8 cm recent CT. Increased echotexture. No
hydronephrosis.

Left Kidney:

Renal measurements: 7.7 x 3.8 x 3.1 cm = volume: 47 mL. 2.3 cm cyst
in the upper to midpole. Increased echotexture. No hydronephrosis.

Bladder:

Decompressed, not visualized.
IMPRESSION: Increased echotexture within the kidneys bilaterally which are
small. Findings compatible chronic medical renal disease.

Complex cystic or mixed solid and cystic mass within the upper pole
of the right kidney, 3.7 cm maximally compared to 3.8 cm on recent
CT.

No acute findings.  No hydronephrosis.

## 2021-07-22 ENCOUNTER — Ambulatory Visit: Payer: Medicare Other | Admitting: Urology

## 2021-07-22 DIAGNOSIS — N189 Chronic kidney disease, unspecified: Secondary | ICD-10-CM

## 2021-07-22 DIAGNOSIS — C649 Malignant neoplasm of unspecified kidney, except renal pelvis: Secondary | ICD-10-CM

## 2021-08-07 ENCOUNTER — Ambulatory Visit (INDEPENDENT_AMBULATORY_CARE_PROVIDER_SITE_OTHER): Payer: Medicare Other | Admitting: Urology

## 2021-08-07 ENCOUNTER — Ambulatory Visit: Payer: Medicare Other | Admitting: Urology

## 2021-08-07 VITALS — BP 176/95 | HR 81

## 2021-08-07 DIAGNOSIS — C649 Malignant neoplasm of unspecified kidney, except renal pelvis: Secondary | ICD-10-CM | POA: Diagnosis not present

## 2021-08-07 DIAGNOSIS — R102 Pelvic and perineal pain: Secondary | ICD-10-CM

## 2021-08-07 MED ORDER — CLOTRIMAZOLE-BETAMETHASONE 1-0.05 % EX CREA: 1.0000 | TOPICAL_CREAM | Freq: Two times a day (BID) | CUTANEOUS | 0 refills | Status: DC | PRN

## 2021-08-07 NOTE — Progress Notes (Signed)
08/07/2021 3:04 PM   Jack Irwin 1965-12-17 [id number]  Referring provider: No referring provider defined for this encounter.  Followup renal masses   HPI: [name] is a 56yo here for evaluation of bilateral renal masses. He was last seen 3 years ago from a 2cm left renal mass and a 4cm right renal mass. He was evaluated at [facility] for kidney transplant and they recommended bilateral nephrectomy. He is currently on hemodialysis  His records from [facility] are as follows: I have a complex kidney cyst. HPI: Jack Irwin is a 56 year-old male established patient who is here for a complex renal cyst.??The problem is on both sides. His kidney cyst was diagnosed 11/30/2015. He is not having pain. He has a bosniak i, bosniak iif, and hemorrhagic renal cyst. Patient denies bosniak ii, bosniak iii, and bosniak iv. He is not currently having flank pain, back pain, groin pain, nausea, vomiting, fever or chills. ??He does not have a history of urinary infections. He has not had kidney surgery. ??02/12/2016: Pt has ESRD on dialysis for 2 years. He underwent MRI which showed 3cm right upper pole complete cyst and a 1.7cm left mid pole mass. No hematuria. No LUTS. He has a known right DVT and is currently on coumadin ??06/18/2016: MRI shows stable bilateral 48F renal cysts. ??12/17/2016: Reanl US shows stable bilateral Bosniak 48F cysts that have not grown in size in the past 6 months ??04/16/2017: Patient added on urgently today to review his recent CT scan. His also had gross hematuria. He has end-stage renal disease on hemodialysis. CT scan done April 03, 2017 shows the left anterior cyst is stable at 1.8 cm, but the right upper pole cyst was said to have increased to 4 x 3.5 cm compared to 3 x 3.5 on the prior MRI. Some of that to me looks like difference in technique. When I go back and measured the cysts in the right upper pole it's about 4 cm, when I measure the entire upper pole parenchymal height  and they are the same at 4.2 - 4.3 cm. ??04/29/2017: His right renal mass has slightly increased in size over the past year. ??12/23/2017: Renal US from 12/14/2017 shows stable appearance of bilateral Bosniak 48F cysts. ??CC: I have testicular pain. HPI: The problem is on the left side. He first noticed the pain approximately 09/27/2017. He does not have a history of scrotal trauma. ??The pain is intermittent. The pain is dull. The intensity of his pain is rated as a 2. Nothing makes the pain better. He has not been treated with any pain medications. ??He has not had a scrotal ultrasound for the pain. He has not had blood in his urine recently. He has not had kidney stones. He has not had a urinary tract infection recently. ??11/04/2017: he developed new right scrotal pain and mild swelling. ??11/11/2017: abscess opened and drained 5 days ago. He is taking the antibiotic and applying clotrimazole BID ??12/23/2017: No new abscesses since last visit. He has not used the clotrimazole cream in 3 weeks     PMH: Past Medical History:  Diagnosis Date   Anemia    Arthritis    BPH (benign prostatic hyperplasia)    Chronic back pain    DVT (deep venous thrombosis) (HCC)    Right popliteal DVT December 2017   End-stage renal disease on hemodialysis [facility])    Dr. Lowanda Foster  - dialysis M/WF   Erectile dysfunction    Essential hypertension  Gout    History of blood transfusion    Neuropathy, diabetic ([place])    Type 2 diabetes mellitus ([place])    Venous (peripheral) insufficiency     Surgical History: Past Surgical History:  Procedure Laterality Date   AV FISTULA PLACEMENT Left 03/09/2014   Procedure: INSERTION OF ARTERIOVENOUS (AV) GORE-TEX GRAFT ARM;  Surgeon: Angelia Mould, MD;  Location: [place];  Service: Vascular;  Laterality: Left;   AV FISTULA PLACEMENT Right 05/06/2016   Procedure: INSERTION OF ARTERIOVENOUS (AV) GORE-TEX GRAFT  Right ARM;  Surgeon: Elam Dutch, MD;  Location: [place];  Service:  Vascular;  Laterality: Right;   AV FISTULA PLACEMENT Right 12/15/2017   Procedure: ARTERIOVENOUS (AV) BRACHIOCEPHALIC FISTULA CREATION;  Surgeon: Elam Dutch, MD;  Location: [place];  Service: Vascular;  Laterality: Right;   AV FISTULA PLACEMENT Left 07/06/2018   Procedure: INSERTION OF ARTERIOVENOUS LOOP GRAFT LEFT UPPER ARM;  Surgeon: Elam Dutch, MD;  Location: [place];  Service: Vascular;  Laterality: Left;   CYST EXCISION Right    cyst removed on thumb 2001   CYSTOSCOPY N/A 09/23/2017   Procedure: CYSTOSCOPY;  Surgeon: Cleon Gustin, MD;  Location: [facility];  Service: Urology;  Laterality: N/A;   INSERTION OF DIALYSIS CATHETER     INSERTION OF DIALYSIS CATHETER Left 03/09/2014   Procedure: INSERTION OF DIALYSIS CATHETER;  Surgeon: Angelia Mould, MD;  Location: [place];  Service: Vascular;  Laterality: Left;   IR THROMBECTOMY AV FISTULA W/THROMBOLYSIS/PTA INC/SHUNT/IMG RIGHT Right 11/01/2016   IR US GUIDE VASC ACCESS RIGHT  11/01/2016   IRRIGATION AND DEBRIDEMENT ABSCESS N/A 02/25/2016   Procedure: IRRIGATION AND DEBRIDEMENT SCROTAL ABSCESS;  Surgeon: Nickie Retort, MD;  Location: [facility];  Service: Urology;  Laterality: N/A;   UPPER EXTREMITY VENOGRAPHY Left 04/08/2018   Procedure: UPPER EXTREMITY VENOGRAPHY;  Surgeon: Marty Heck, MD;  Location: [place] [facility];  Service: Cardiovascular;  Laterality: Left;    Home Medications:  Allergies as of 08/07/2021       Reactions   Doxycycline Itching        Medication List        Accurate as of August 07, 2021  3:04 PM. If you have any questions, ask your nurse or doctor.          acetaminophen 500 MG tablet Commonly known as: TYLENOL Take 1,000 mg by mouth every 6 (six) hours as needed for mild pain.   amLODipine 10 MG tablet Commonly known as: NORVASC Take 10 mg by mouth daily.   ammonium lactate 12 % lotion Commonly known as: AmLactin Apply 1 application topically as needed for dry skin.    clotrimazole 1 % cream Commonly known as: LOTRIMIN Apply to both feet and between toes twice daily What changed:  how much to take how to take this when to take this reasons to take this   clotrimazole-betamethasone cream Commonly known as: LOTRISONE Apply 1 application topically 2 (two) times daily as needed (boils).   colchicine 0.6 MG tablet Take 0.5 tablets (0.3 mg total) by mouth daily as needed (for gout).   DAYQUIL PO Take 1 tablet by mouth every 6 (six) hours as needed (cold symptoms).   diclofenac sodium 1 % Gel Commonly known as: VOLTAREN Apply 2 g topically 4 (four) times daily.   Eliquis 2.5 MG Tabs tablet Generic drug: apixaban Take 1 tablet by mouth twice daily   finasteride 5 MG tablet Commonly known as: PROSCAR Take  5 mg by mouth daily.   gabapentin 100 MG capsule Commonly known as: NEURONTIN Take 1 capsule (100 mg total) by mouth daily.   HYDROcodone-acetaminophen 5-325 MG tablet Commonly known as: NORCO/VICODIN Take 1-2 tablets by mouth every 4 (four) hours as needed.   lovastatin 10 MG tablet Commonly known as: MEVACOR SMARTSIG:1 Tablet(s) By Mouth Every Evening   lovastatin 10 MG tablet Commonly known as: MEVACOR SMARTSIG:1 Tablet(s) By Mouth Every Evening   midodrine 10 MG tablet Commonly known as: PROAMATINE Take 10 mg by mouth See admin instructions. 1 tablet by mouth 45 minutes before dialysis   Olopatadine HCl 0.2 % Soln Apply 1 drop to eye 2 (two) times daily.   ondansetron 4 MG disintegrating tablet Commonly known as: Zofran ODT Take 1 tablet (4 mg total) by mouth every 8 (eight) hours as needed for nausea or vomiting.   oxyCODONE 5 MG immediate release tablet Commonly known as: Roxicodone Take 1 tablet (5 mg total) by mouth every 6 (six) hours as needed.   sevelamer carbonate 800 MG tablet Commonly known as: RENVELA Take 2-4 tablets (1,600-3,200 mg total) by mouth See admin instructions. Take 3200 mg with each meal and take  1600 mg with each snack   sildenafil 20 MG tablet Commonly known as: REVATIO DISSOLVE 1/2 TROCHE UNDER THE TONGUE 60 MINUTES PRIOR TO SEXUAL ENCOUNTER.   Uloric 40 MG tablet Generic drug: febuxostat Take 1 tablet (40 mg total) by mouth daily.   Vitamin D (Ergocalciferol) 1.25 MG (50000 UNIT) Caps capsule Commonly known as: DRISDOL Take 50,000 Units by mouth 2 (two) times a week.        Allergies:  Allergies  Allergen Reactions   Doxycycline Itching    Family History: Family History  Problem Relation Age of Onset   Diabetes Mother    Hypertension Mother    Heart failure Mother    Hyperlipidemia Mother    Heart attack Mother    Cancer Father    Diabetes Father    Hypertension Father    Hyperlipidemia Father     Social History:  reports that he has never smoked. He has never used smokeless tobacco. He reports that he does not drink alcohol and does not use drugs.  ROS: All other review of systems were reviewed and are negative except what is noted above in HPI  Physical Exam: BP (!) 176/95   Pulse 81   Constitutional:  Alert and oriented, No acute distress. HEENT: [place] AT, moist mucus membranes.  Trachea midline, no masses. Cardiovascular: No clubbing, cyanosis, or edema. Respiratory: Normal respiratory effort, no increased work of breathing. GI: Abdomen is soft, nontender, nondistended, no abdominal masses GU: No CVA tenderness. Circumcised phallus. No masses/lesions on penis, testis, scrotum.  Lymph: No cervical or inguinal lymphadenopathy. Skin: No rashes, bruises or suspicious lesions. Neurologic: Grossly intact, no focal deficits, moving all 4 extremities. Psychiatric: Normal mood and affect.  Laboratory Data: Lab Results  Component Value Date   WBC 9.1 07/12/2018   HGB 14.7 07/12/2018   HCT 45.9 07/12/2018   MCV 93.5 07/12/2018   PLT 124 (L) 07/12/2018    Lab Results  Component Value Date   CREATININE 17.69 (H) 07/12/2018    Lab Results   Component Value Date   PSA 0.8 11/06/2016   PSA 0.86 07/05/2015    No results found for: "TESTOSTERONE"  Lab Results  Component Value Date   HGBA1C 5.4 09/16/2018    Urinalysis    Component Value Date/Time  COLORURINE YELLOW 08/28/2014 1728   APPEARANCEUR CLEAR 08/28/2014 1728   LABSPEC 1.020 04/01/2017 1422   PHURINE 7.5 04/01/2017 1422   GLUCOSEU 100 (A) 04/01/2017 1422   HGBUR LARGE (A) 04/01/2017 1422   BILIRUBINUR NEG 05/28/2017 1200   KETONESUR NEGATIVE 04/01/2017 1422   PROTEINUR >=300 05/28/2017 1200   PROTEINUR >=300 (A) 04/01/2017 1422   UROBILINOGEN 0.2 05/28/2017 1200   UROBILINOGEN 0.2 04/01/2017 1422   NITRITE NEG 05/28/2017 1200   NITRITE NEGATIVE 04/01/2017 1422   LEUKOCYTESUR Small (1+) (A) 05/28/2017 1200    Lab Results  Component Value Date   BACTERIA RARE 08/28/2014    Pertinent Imaging:  No results found for this or any previous visit.  Results for orders placed during the hospital encounter of 12/31/16  US Venous Img Lower Bilateral  Narrative CLINICAL DATA:  Bilateral lower extremity pain.  Evaluate for DVT.  EXAM: BILATERAL LOWER EXTREMITY VENOUS DOPPLER ULTRASOUND  TECHNIQUE: Gray-scale sonography with graded compression, as well as color Doppler and duplex ultrasound were performed to evaluate the lower extremity deep venous systems from the level of the common femoral vein and including the common femoral, femoral, profunda femoral, popliteal and calf veins including the posterior tibial, peroneal and gastrocnemius veins when visible. The superficial great saphenous vein was also interrogated. Spectral Doppler was utilized to evaluate flow at rest and with distal augmentation maneuvers in the common femoral, femoral and popliteal veins.  COMPARISON:  None.  FINDINGS: RIGHT LOWER EXTREMITY  Common Femoral Vein: No evidence of thrombus. Normal compressibility, respiratory phasicity and response to  augmentation.  Saphenofemoral Junction: No evidence of thrombus. Normal compressibility and flow on color Doppler imaging.  Profunda Femoral Vein: No evidence of thrombus. Normal compressibility and flow on color Doppler imaging.  Femoral Vein: No evidence of thrombus. Normal compressibility, respiratory phasicity and response to augmentation.  Popliteal Vein: No evidence of thrombus. Normal compressibility, respiratory phasicity and response to augmentation.  Calf Veins: No evidence of thrombus. Normal compressibility and flow on color Doppler imaging.  Superficial Great Saphenous Vein: No evidence of thrombus. Normal compressibility.  Venous Reflux:  None.  Other Findings:  None.  LEFT LOWER EXTREMITY  Common Femoral Vein: No evidence of thrombus. Normal compressibility, respiratory phasicity and response to augmentation.  Saphenofemoral Junction: No evidence of thrombus. Normal compressibility and flow on color Doppler imaging.  Profunda Femoral Vein: No evidence of thrombus. Normal compressibility and flow on color Doppler imaging.  Femoral Vein: No evidence of thrombus. Normal compressibility, respiratory phasicity and response to augmentation.  Popliteal Vein: No evidence of thrombus. Normal compressibility, respiratory phasicity and response to augmentation.  Calf Veins: No evidence of thrombus. Normal compressibility and flow on color Doppler imaging.  Superficial Great Saphenous Vein: No evidence of thrombus. Normal compressibility.  Venous Reflux:  None.  Other Findings:  None.  IMPRESSION: No evidence of DVT within either lower extremity.   Electronically Signed By: Sandi Mariscal M.D. On: 12/31/2016 13:55  No results found for this or any previous visit.  No results found for this or any previous visit.  Results for orders placed during the hospital encounter of 08/05/18  US RENAL  Narrative CLINICAL DATA:  chronic pelvic pain, end-stage  renal disease  EXAM: RENAL / URINARY TRACT ULTRASOUND COMPLETE  COMPARISON:  07/12/2018  FINDINGS: Right Kidney:  Renal measurements: 7.9 x 3.2 x 3.5 cm = volume: 46.7 mL. 3.7 cm complex cystic versus mixed solid and cystic mass in the upper pole. This measured 3.8 cm  recent CT. Increased echotexture. No hydronephrosis.  Left Kidney:  Renal measurements: 7.7 x 3.8 x 3.1 cm = volume: 47 mL. 2.3 cm cyst in the upper to midpole. Increased echotexture. No hydronephrosis.  Bladder:  Decompressed, not visualized.  IMPRESSION: Increased echotexture within the kidneys bilaterally which are small. Findings compatible chronic medical renal disease.  Complex cystic or mixed solid and cystic mass within the upper pole of the right kidney, 3.7 cm maximally compared to 3.8 cm on recent CT.  No acute findings.  No hydronephrosis.   Electronically Signed By: Rolm Baptise M.D. On: 08/05/2018 19:04  No results found for this or any previous visit.  No results found for this or any previous visit.  Results for orders placed during the hospital encounter of 11/01/15  CT RENAL STONE STUDY  Narrative CLINICAL DATA:  56 year old male with rectal pain and bleeding.  EXAM: CT ABDOMEN AND PELVIS WITHOUT CONTRAST  TECHNIQUE: Multidetector CT imaging of the abdomen and pelvis was performed following the standard protocol without IV contrast.  COMPARISON:  CT of the abdomen pelvis dated 05/07/2010  FINDINGS: Evaluation of this exam is limited in the absence of intravenous contrast.  Lower chest: The visualized lung bases are clear. No intra-abdominal free air or free fluid.  Hepatobiliary: No focal liver abnormality is seen. No gallstones, gallbladder wall thickening, or biliary dilatation.  Pancreas: Unremarkable. No pancreatic ductal dilatation or surrounding inflammatory changes.  Spleen: Normal in size without focal abnormality.  Adrenals/Urinary Tract: There is a 9  mm right adrenal adenoma. The left adrenal gland appears unremarkable. There is mild moderate bilateral renal atrophy. There is a 3.3 x 2.2 cm partially exophytic right renal upper pole hypodense lesion which is incompletely characterized but appears new or increased in size since the prior CT. This may represent a somewhat complex cyst, however the lesion with solid components not excluded. A 1.3 x 1.4 cm exophytic lesion noted from the anterior interpolar aspect of the left kidney. Further evaluation of the kidneys with nonemergent MRI without and with contrast is recommended. There is no hydronephrosis or nephrolithiasis on either side. The visualized ureters and urinary bladder appear unremarkable.  Stomach/Bowel: There is moderate stool throughout the colon. There is colonic diverticulosis without active inflammatory changes. No evidence of bowel obstruction or active inflammation. No perirectal fluid collection or inflammatory changes identified. The appendix appears unremarkable.  Vascular/Lymphatic: No significant vascular findings are present. No enlarged abdominal or pelvic lymph nodes.  Reproductive: Prostate gland is grossly unremarkable.  Other: There is mild thickened appearance of the right gluteal skin along the gluteal fissure (series 2, image 96). Minimal fluid collection measuring 2.7 x 1.0 cm (series 4, image 106) noted along this region. Correlation with clinical exam recommended. Ultrasound may provide additional information.  Musculoskeletal: There is degenerative changes of the spine. L3-L4 disc desiccation with vacuum phenomena. No acute fracture. There is mild intubated appearance of the paraspinal fat at T8 (series 2, image 1). This area is only partially visualized and incompletely evaluated. Although this may be related to chronic changes or paraspinal vessels an infectious process is not excluded. Correlation with clinical exam recommended to evaluate  for point tenderness over the lower thoracic spine.  IMPRESSION: Mild thickening of the skin of the medial aspect of the right gluteal region along the gluteal fissure with a probable small subcutaneous fluid collection. Clinical correlation is recommended.  No perirectal inflammation or abscess.  **An incidental finding of potential clinical significance has been found. Bilateral  renal hypodense lesions as described. Further evaluation with nonemergent MRI without and with contrast recommended.**  Mild induration of the paraspinal soft tissues at T8, likely chronic or related to paraspinal vessels. An infectious process is less likely. Correlation with clinical exam recommended to evaluate for focal tenderness.   Electronically Signed By: Anner Crete M.D. On: 11/02/2015 00:25   Assessment & Plan:    1. Malignant neoplasm of kidney, unspecified laterality ([place]) -We discussed the natural hx of renal masses and the 80/20 malignant/benign likelihood. We disucssed the treatment options including active surveillance. Renal ablation, partial and radical nephrectomy. After discussing the options the patient wishes to proceed with nephrectomy. He will keep his already scheduled surgery date with [facility] for nephrectomy     No follow-ups on file.  Nicolette Bang, MD  [facility] [place]

## 2021-08-08 ENCOUNTER — Telehealth: Payer: Self-pay

## 2021-08-08 NOTE — Telephone Encounter (Signed)
Patient called to check on rx that was supposed to be sent in.  Rx was sent in and call patient to inform him.

## 2021-08-13 ENCOUNTER — Encounter: Payer: Self-pay | Admitting: Urology

## 2021-08-13 NOTE — Patient Instructions (Signed)
Renal Mass  A renal mass is an abnormal growth in the kidney. It may be found while performing an MRI, CT scan, or ultrasound to evaluate other problems of the abdomen. A renal mass that is cancerous (malignant) may grow or spread quickly. Others are not cancerous (benign). Renal masses include: Tumors. These may be malignant or benign. The most common type of kidney cancer in adults is renal cell carcinoma. In children, the most common type of kidney cancer is Wilms tumor. The most common benign tumors of the kidney include renal adenomas, oncocytomas, and angiomyolipoma (AML). Cysts. These are fluid-filled sacs that form on or in the kidney. What are the causes? Certain types of cancers, infections, or injuries can cause a renal mass. It is not always known what causes a cyst to develop in or on the kidney. What are the signs or symptoms? Often, a renal mass does not cause any signs or symptoms; most kidney cysts do not cause symptoms. How is this diagnosed? Your health care provider may recommend tests to diagnose the cause of your renal mass. These tests may be done if a renal mass is found: Physical exam. Blood tests. Urine tests. Imaging tests, such as ultrasound, CT scan, or MRI. Biopsy. This is a small sample that is removed from the renal mass and tested in a lab. The exact tests and how often they are done will depend on: The size and appearance of the renal mass. Risk factors or medical conditions that increase your risk for problems. Any symptoms associated with the renal mass, or concerns that you have about it. Tests and physical exams may be done once, or they may be done regularly for a period of time. Tests and exams that are done regularly will help monitor whether the mass is growing and beginning to cause problems. How is this treated? Treatment is not always needed for this condition. Your health care provider may recommend careful monitoring and regular tests and exams.  Treatment will depend on the cause of the mass. Treatment for a cancerous renal mass may include surgical removal, chemotherapy, radiation, or immunotherapy. Most kidney cysts do not need to be treated. Follow these instructions at home: What you need to do at home will depend on the cause of the mass. Follow the instructions that your health care provider gives to you. In general: Take over-the-counter and prescription medicines only as told by your health care provider. If you were prescribed an antibiotic medicine, take it as told by your health care provider. Do not stop taking the antibiotic even if you start to feel better. Follow any restrictions that are given to you by your health care provider. Keep all follow-up visits. This is important. You may need to see your health care provider once or twice a year to have CT scans and ultrasounds. These tests will show if your renal mass has changed or grown. Contact a health care provider if you: Have pain in your side or back (flank pain). Have a fever. Feel full soon after eating. Have pain or swelling in the abdomen. Lose weight. Get help right away if: Your pain gets worse. There is blood in your urine. You cannot urinate. You have chest pain. You have trouble breathing. These symptoms may represent a serious problem that is an emergency. Do not wait to see if the symptoms will go away. Get medical help right away. Call your local emergency services (911 in the [place] Summary A renal mass is an   abnormal growth in the kidney. It may be cancerous (malignant) and grow or spread quickly, or it may not be cancerous (benign). Renal masses often do not have any signs or symptoms. Renal masses may be found while performing an MRI, CT scan, or ultrasound for other problems of the abdomen. Your health care provider may recommend that you have tests to diagnose the cause of your renal mass. These may include a physical exam, blood tests, urine  tests, imaging, or a biopsy. Treatment is not always needed for this condition. Careful monitoring may be recommended. This information is not intended to replace advice given to you by your health care provider. Make sure you discuss any questions you have with your health care provider. Document Revised: 07/18/2019 Document Reviewed: 07/18/2019 Elsevier Patient Education  2023 Elsevier Inc.  

## 2021-09-05 ENCOUNTER — Other Ambulatory Visit: Payer: Self-pay | Admitting: Urology

## 2021-11-11 ENCOUNTER — Telehealth: Payer: Self-pay

## 2021-11-11 NOTE — Telephone Encounter (Signed)
Returned call to patient and left voicemail to contact the office.

## 2021-11-12 ENCOUNTER — Ambulatory Visit: Payer: Medicare Other | Admitting: Urology

## 2021-11-20 NOTE — Progress Notes (Deleted)
Cardiology Office Note  Date: 11/20/2021   ID: Jack Irwin, DOB 19-Nov-1965, MRN [id number]  PCP:  Patient, No Pcp Per  Cardiologist:  Rozann Lesches, MD Electrophysiologist:  None   No chief complaint on file.   History of Present Illness: Jack Irwin is a 56 y.o. male last assessed via telehealth encounter in March 2021.  Past Medical History:  Diagnosis Date   Anemia    Arthritis    BPH (benign prostatic hyperplasia)    Chronic back pain    DVT (deep venous thrombosis) ([place])    Right popliteal DVT December 2017   End-stage renal disease on hemodialysis [facility])    Dr. Lowanda Foster  - dialysis M/WF   Erectile dysfunction    Essential hypertension    Gout    History of blood transfusion    Neuropathy, diabetic ([place])    Type 2 diabetes mellitus ([place])    Venous (peripheral) insufficiency     Past Surgical History:  Procedure Laterality Date   AV FISTULA PLACEMENT Left 03/09/2014   Procedure: INSERTION OF ARTERIOVENOUS (AV) GORE-TEX GRAFT ARM;  Surgeon: Angelia Mould, MD;  Location: [place];  Service: Vascular;  Laterality: Left;   AV FISTULA PLACEMENT Right 05/06/2016   Procedure: INSERTION OF ARTERIOVENOUS (AV) GORE-TEX GRAFT  Right ARM;  Surgeon: Elam Dutch, MD;  Location: [place];  Service: Vascular;  Laterality: Right;   AV FISTULA PLACEMENT Right 12/15/2017   Procedure: ARTERIOVENOUS (AV) BRACHIOCEPHALIC FISTULA CREATION;  Surgeon: Elam Dutch, MD;  Location: [place];  Service: Vascular;  Laterality: Right;   AV FISTULA PLACEMENT Left 07/06/2018   Procedure: INSERTION OF ARTERIOVENOUS LOOP GRAFT LEFT UPPER ARM;  Surgeon: Elam Dutch, MD;  Location: [place];  Service: Vascular;  Laterality: Left;   CYST EXCISION Right    cyst removed on thumb 2001   CYSTOSCOPY N/A 09/23/2017   Procedure: CYSTOSCOPY;  Surgeon: Cleon Gustin, MD;  Location: [facility];  Service: Urology;  Laterality: N/A;   INSERTION OF DIALYSIS CATHETER     INSERTION OF  DIALYSIS CATHETER Left 03/09/2014   Procedure: INSERTION OF DIALYSIS CATHETER;  Surgeon: Angelia Mould, MD;  Location: [place];  Service: Vascular;  Laterality: Left;   IR THROMBECTOMY AV FISTULA W/THROMBOLYSIS/PTA INC/SHUNT/IMG RIGHT Right 11/01/2016   IR US GUIDE VASC ACCESS RIGHT  11/01/2016   IRRIGATION AND DEBRIDEMENT ABSCESS N/A 02/25/2016   Procedure: IRRIGATION AND DEBRIDEMENT SCROTAL ABSCESS;  Surgeon: Nickie Retort, MD;  Location: [facility];  Service: Urology;  Laterality: N/A;   UPPER EXTREMITY VENOGRAPHY Left 04/08/2018   Procedure: UPPER EXTREMITY VENOGRAPHY;  Surgeon: Marty Heck, MD;  Location: [place] [facility];  Service: Cardiovascular;  Laterality: Left;    Current Outpatient Medications  Medication Sig Dispense Refill   acetaminophen (TYLENOL) 500 MG tablet Take 1,000 mg by mouth every 6 (six) hours as needed for mild pain.      amLODipine (NORVASC) 10 MG tablet Take 10 mg by mouth daily.     ammonium lactate (AMLACTIN) 12 % lotion Apply 1 application topically as needed for dry skin. 400 g 0   clotrimazole (LOTRIMIN) 1 % cream Apply to both feet and between toes twice daily (Patient taking differently: Apply 1 application topically 2 (two) times daily as needed. Apply to both feet and between toes twice daily) 60 g 1   clotrimazole-betamethasone (LOTRISONE) cream APPLY CREAM TOPICALLY TWICE DAILY AS NEEDED FOR BOILS 30 g 0   colchicine 0.6  MG tablet Take 0.5 tablets (0.3 mg total) by mouth daily as needed (for gout). 30 tablet 3   diclofenac sodium (VOLTAREN) 1 % GEL Apply 2 g topically 4 (four) times daily. 350 g 0   ELIQUIS 2.5 MG TABS tablet Take 1 tablet by mouth twice daily 60 tablet 0   finasteride (PROSCAR) 5 MG tablet Take 5 mg by mouth daily.      gabapentin (NEURONTIN) 100 MG capsule Take 1 capsule (100 mg total) by mouth daily. (Patient not taking: Reported on 04/27/2019) 30 capsule 2   HYDROcodone-acetaminophen (NORCO/VICODIN) 5-325 MG tablet Take 1-2  tablets by mouth every 4 (four) hours as needed. 6 tablet 0   lovastatin (MEVACOR) 10 MG tablet SMARTSIG:1 Tablet(s) By Mouth Every Evening     lovastatin (MEVACOR) 10 MG tablet SMARTSIG:1 Tablet(s) By Mouth Every Evening     midodrine (PROAMATINE) 10 MG tablet Take 10 mg by mouth See admin instructions. 1 tablet by mouth 45 minutes before dialysis     Olopatadine HCl 0.2 % SOLN Apply 1 drop to eye 2 (two) times daily.     ondansetron (ZOFRAN ODT) 4 MG disintegrating tablet Take 1 tablet (4 mg total) by mouth every 8 (eight) hours as needed for nausea or vomiting. 6 tablet 0   oxyCODONE (ROXICODONE) 5 MG immediate release tablet Take 1 tablet (5 mg total) by mouth every 6 (six) hours as needed. 20 tablet 0   Pseudoephedrine-APAP-DM (DAYQUIL PO) Take 1 tablet by mouth every 6 (six) hours as needed (cold symptoms).     sevelamer carbonate (RENVELA) 800 MG tablet Take 2-4 tablets (1,600-3,200 mg total) by mouth See admin instructions. Take 3200 mg with each meal and take 1600 mg with each snack 420 tablet 5   sildenafil (REVATIO) 20 MG tablet DISSOLVE 1/2 TROCHE UNDER THE TONGUE 60 MINUTES PRIOR TO SEXUAL ENCOUNTER. 10 tablet 98   ULORIC 40 MG tablet Take 1 tablet (40 mg total) by mouth daily. 30 tablet 5   Vitamin D, Ergocalciferol, (DRISDOL) 1.25 MG (50000 UNIT) CAPS capsule Take 50,000 Units by mouth 2 (two) times a week.     No current facility-administered medications for this visit.   Allergies:  Doxycycline   Social History: The patient  reports that he has never smoked. He has never used smokeless tobacco. He reports that he does not drink alcohol and does not use drugs.   Family History: The patient's family history includes Cancer in his father; Diabetes in his father and mother; Heart attack in his mother; Heart failure in his mother; Hyperlipidemia in his father and mother; Hypertension in his father and mother.   ROS:  Please see the history of present illness. Otherwise, complete  review of systems is positive for {NONE DEFAULTED:18576}.  All other systems are reviewed and negative.   Physical Exam: VS:  There were no vitals taken for this visit., BMI There is no height or weight on file to calculate BMI.  Wt Readings from Last 3 Encounters:  04/27/19 262 lb (118.8 kg)  10/01/18 262 lb (118.8 kg)  09/16/18 262 lb (118.8 kg)    General: Patient appears comfortable at rest. HEENT: Conjunctiva and lids normal, oropharynx clear with moist mucosa. Neck: Supple, no elevated JVP or carotid bruits, no thyromegaly. Lungs: Clear to auscultation, nonlabored breathing at rest. Cardiac: Regular rate and rhythm, no S3 or significant systolic murmur, no pericardial rub. Abdomen: Soft, nontender, no hepatomegaly, bowel sounds present, no guarding or rebound. Extremities: No pitting edema,  distal pulses 2+. Skin: Warm and dry. Musculoskeletal: No kyphosis. Neuropsychiatric: Alert and oriented x3, affect grossly appropriate.  ECG:  An ECG dated 07/12/2018 was personally reviewed today and demonstrated:  Sinus tachycardia with right bundle branch block.  Recent Labwork:    Component Value Date/Time   CHOL 127 11/06/2016 0924   TRIG 99 11/06/2016 0924   HDL 35 (L) 11/06/2016 0924   CHOLHDL 3.6 11/06/2016 0924   VLDL 15 07/05/2015 1129   LDLCALC 74 11/06/2016 0924  October 2023: Creatinine 12.99, AST 27, ALT 17  Other Studies Reviewed Today:  Cardiac monitor March 2021: [employer] cardiac monitor reviewed, 7 days 9 hours analyzed.  Predominant rhythm is sinus.  Heart rate ranged from 52 bpm up to 154 bpm with average heart rate 89 bpm.  Rare PACs and PVCs were noted representing less than 1% of total beats.  There were very brief atrial runs no longer than 3 beats and one 3 beat run of NSVT.  These were not clearly symptom provoking based on patient triggered events.  No sustained arrhythmias or significant pauses.  Echocardiogram 04/09/2021 ([place]): SUMMARY  The left  ventricular size is normal.  There is normal left ventricular wall thickness.  LV ejection fraction = 60-65%.  Left ventricular systolic function is normal.  Left ventricular filling pattern is prolonged relaxation.  The left ventricular wall motion is normal.  The right ventricle is mildly dilated.  The right ventricular systolic function is normal.  There is no significant valvular stenosis or regurgitation.  The aortic sinus is normal size.  IVC size was normal.  There is small size pericardial effusion.  There is no comparison study available.   Lexiscan Myoview 05/09/2021 ([place]): 1.  No inducible ischemia or fixed transmural defect.  2.  Normal left ventricular ejection fraction of 63%.  3.  No wall motion abnormality.  Assessment and Plan:   Medication Adjustments/Labs and Tests Ordered: Current medicines are reviewed at length with the patient today.  Concerns regarding medicines are outlined above.   Tests Ordered: No orders of the defined types were placed in this encounter.   Medication Changes: No orders of the defined types were placed in this encounter.   Disposition:  Follow up {follow up:15908}  Signed, Satira Sark, MD, [facility] 11/20/2021 2:35 PM    [facility] at [facility] [address] [place], [place] [address] Phone: [phone]; Fax: [phone]

## 2021-11-21 ENCOUNTER — Ambulatory Visit: Payer: Medicare Other | Admitting: Cardiology

## 2021-11-21 DIAGNOSIS — I10 Essential (primary) hypertension: Secondary | ICD-10-CM

## 2021-12-10 ENCOUNTER — Telehealth: Payer: Self-pay

## 2021-12-10 ENCOUNTER — Ambulatory Visit: Payer: Medicare Other | Admitting: Urology

## 2021-12-10 NOTE — Telephone Encounter (Signed)
Patient calling to ask if Dr. Alyson Ingles can place an order for a CT scan w/ contrast?  If so, call pt back to let him know and to schedule.  Call back: [phone]   Thanks, Helene Kelp

## 2021-12-11 NOTE — Telephone Encounter (Signed)
Verbal from Dr. Alyson Ingles he will not order CT since pt is on dialysis.  He recommends pt be seen by a urologist with [facility] or [facility] because he cannot perform surgery pt is needing.  Patient aware and voiced understanding.

## 2021-12-30 ENCOUNTER — Ambulatory Visit: Payer: Medicare Other | Admitting: Urology

## 2022-01-24 ENCOUNTER — Ambulatory Visit: Payer: Medicare Other | Admitting: Urology

## 2024-04-06 ENCOUNTER — Ambulatory Visit: Admitting: Urology
